# Patient Record
Sex: Male | Born: 1960
Health system: Southern US, Community
[De-identification: ages and names within clinical notes are randomized; demographics above are authoritative.]

## PROBLEM LIST (undated history)

## (undated) DIAGNOSIS — Z95810 Presence of automatic (implantable) cardiac defibrillator: Secondary | ICD-10-CM

## (undated) DIAGNOSIS — I5022 Chronic systolic (congestive) heart failure: Secondary | ICD-10-CM

## (undated) DIAGNOSIS — H269 Unspecified cataract: Secondary | ICD-10-CM

## (undated) DIAGNOSIS — I4891 Unspecified atrial fibrillation: Secondary | ICD-10-CM

## (undated) DIAGNOSIS — E11319 Type 2 diabetes mellitus with unspecified diabetic retinopathy without macular edema: Secondary | ICD-10-CM

## (undated) DIAGNOSIS — E119 Type 2 diabetes mellitus without complications: Secondary | ICD-10-CM

## (undated) DIAGNOSIS — Z91199 Patient's noncompliance with other medical treatment and regimen due to unspecified reason: Secondary | ICD-10-CM

## (undated) DIAGNOSIS — E113299 Type 2 diabetes mellitus with mild nonproliferative diabetic retinopathy without macular edema, unspecified eye: Secondary | ICD-10-CM

## (undated) DIAGNOSIS — N189 Chronic kidney disease, unspecified: Secondary | ICD-10-CM

## (undated) DIAGNOSIS — I10 Essential (primary) hypertension: Secondary | ICD-10-CM

## (undated) DIAGNOSIS — I255 Ischemic cardiomyopathy: Secondary | ICD-10-CM

## (undated) DIAGNOSIS — N183 Chronic kidney disease, stage 3 unspecified: Secondary | ICD-10-CM

## (undated) DIAGNOSIS — M109 Gout, unspecified: Secondary | ICD-10-CM

## (undated) DIAGNOSIS — R0600 Dyspnea, unspecified: Secondary | ICD-10-CM

## (undated) DIAGNOSIS — G629 Polyneuropathy, unspecified: Secondary | ICD-10-CM

## (undated) DIAGNOSIS — I219 Acute myocardial infarction, unspecified: Secondary | ICD-10-CM

## (undated) DIAGNOSIS — R55 Syncope and collapse: Secondary | ICD-10-CM

## (undated) DIAGNOSIS — I509 Heart failure, unspecified: Secondary | ICD-10-CM

## (undated) DIAGNOSIS — H4312 Vitreous hemorrhage, left eye: Secondary | ICD-10-CM

## (undated) DIAGNOSIS — U071 COVID-19: Secondary | ICD-10-CM

## (undated) DIAGNOSIS — Z9119 Patient's noncompliance with other medical treatment and regimen: Secondary | ICD-10-CM

## (undated) DIAGNOSIS — A4902 Methicillin resistant Staphylococcus aureus infection, unspecified site: Secondary | ICD-10-CM

## (undated) DIAGNOSIS — H35039 Hypertensive retinopathy, unspecified eye: Secondary | ICD-10-CM

## (undated) DIAGNOSIS — I2510 Atherosclerotic heart disease of native coronary artery without angina pectoris: Secondary | ICD-10-CM

## (undated) HISTORY — DX: Type 2 diabetes mellitus with unspecified diabetic retinopathy without macular edema: E11.319

## (undated) HISTORY — DX: Ischemic cardiomyopathy: I25.5

## (undated) HISTORY — DX: Acute myocardial infarction, unspecified: I21.9

## (undated) HISTORY — PX: SKIN GRAFT: SHX250

## (undated) HISTORY — PX: INSERT / REPLACE / REMOVE PACEMAKER: SUR710

## (undated) HISTORY — DX: Syncope and collapse: R55

## (undated) HISTORY — DX: Hypertensive retinopathy, unspecified eye: H35.039

## (undated) HISTORY — DX: Heart failure, unspecified: I50.9

## (undated) HISTORY — DX: Chronic systolic (congestive) heart failure: I50.22

## (undated) HISTORY — DX: Unspecified cataract: H26.9

---

## 2000-08-06 HISTORY — PX: CORONARY ARTERY BYPASS GRAFT: SHX141

## 2000-08-06 HISTORY — PX: CARDIAC CATHETERIZATION: SHX172

## 2001-04-19 ENCOUNTER — Emergency Department (HOSPITAL_COMMUNITY): Admission: EM | Admit: 2001-04-19 | Discharge: 2001-04-19 | Payer: Self-pay

## 2001-04-24 ENCOUNTER — Emergency Department (HOSPITAL_COMMUNITY): Admission: EM | Admit: 2001-04-24 | Discharge: 2001-04-24 | Payer: Self-pay

## 2001-05-01 ENCOUNTER — Encounter: Payer: Self-pay | Admitting: Emergency Medicine

## 2001-05-01 ENCOUNTER — Inpatient Hospital Stay (HOSPITAL_COMMUNITY): Admission: EM | Admit: 2001-05-01 | Discharge: 2001-05-13 | Payer: Self-pay | Admitting: Emergency Medicine

## 2001-05-03 ENCOUNTER — Encounter: Payer: Self-pay | Admitting: Cardiology

## 2001-05-07 ENCOUNTER — Encounter: Payer: Self-pay | Admitting: Cardiothoracic Surgery

## 2001-05-07 ENCOUNTER — Encounter: Payer: Self-pay | Admitting: Cardiology

## 2001-05-08 ENCOUNTER — Encounter: Payer: Self-pay | Admitting: Cardiothoracic Surgery

## 2001-05-09 ENCOUNTER — Encounter: Payer: Self-pay | Admitting: Cardiothoracic Surgery

## 2001-05-10 ENCOUNTER — Encounter: Payer: Self-pay | Admitting: Cardiothoracic Surgery

## 2001-06-10 ENCOUNTER — Encounter (HOSPITAL_COMMUNITY): Admission: RE | Admit: 2001-06-10 | Discharge: 2001-09-08 | Payer: Self-pay | Admitting: Cardiology

## 2004-06-13 ENCOUNTER — Ambulatory Visit: Payer: Self-pay | Admitting: Family Medicine

## 2005-08-16 ENCOUNTER — Ambulatory Visit: Payer: Self-pay | Admitting: Family Medicine

## 2005-08-21 ENCOUNTER — Ambulatory Visit: Payer: Self-pay

## 2005-08-28 ENCOUNTER — Ambulatory Visit: Payer: Self-pay | Admitting: *Deleted

## 2008-01-09 ENCOUNTER — Ambulatory Visit: Payer: Self-pay | Admitting: Family Medicine

## 2008-01-09 DIAGNOSIS — I252 Old myocardial infarction: Secondary | ICD-10-CM | POA: Insufficient documentation

## 2008-01-09 DIAGNOSIS — E785 Hyperlipidemia, unspecified: Secondary | ICD-10-CM | POA: Insufficient documentation

## 2008-01-09 DIAGNOSIS — I10 Essential (primary) hypertension: Secondary | ICD-10-CM | POA: Insufficient documentation

## 2008-01-09 DIAGNOSIS — I255 Ischemic cardiomyopathy: Secondary | ICD-10-CM | POA: Insufficient documentation

## 2008-08-09 ENCOUNTER — Ambulatory Visit: Payer: Self-pay | Admitting: Family Medicine

## 2008-08-09 DIAGNOSIS — L02229 Furuncle of trunk, unspecified: Secondary | ICD-10-CM

## 2008-08-09 DIAGNOSIS — L02239 Carbuncle of trunk, unspecified: Secondary | ICD-10-CM | POA: Insufficient documentation

## 2008-08-10 ENCOUNTER — Encounter: Payer: Self-pay | Admitting: Family Medicine

## 2008-10-08 ENCOUNTER — Ambulatory Visit: Payer: Self-pay | Admitting: Family Medicine

## 2008-10-08 DIAGNOSIS — M722 Plantar fascial fibromatosis: Secondary | ICD-10-CM | POA: Insufficient documentation

## 2008-10-19 ENCOUNTER — Encounter: Admission: RE | Admit: 2008-10-19 | Discharge: 2008-10-19 | Payer: Self-pay | Admitting: Podiatry

## 2008-10-21 ENCOUNTER — Emergency Department (HOSPITAL_COMMUNITY): Admission: EM | Admit: 2008-10-21 | Discharge: 2008-10-21 | Payer: Self-pay | Admitting: Emergency Medicine

## 2008-10-21 ENCOUNTER — Ambulatory Visit: Payer: Self-pay | Admitting: Internal Medicine

## 2008-10-21 ENCOUNTER — Inpatient Hospital Stay (HOSPITAL_COMMUNITY): Admission: AD | Admit: 2008-10-21 | Discharge: 2008-10-26 | Payer: Self-pay | Admitting: Internal Medicine

## 2008-11-02 ENCOUNTER — Encounter: Payer: Self-pay | Admitting: Family Medicine

## 2008-11-05 ENCOUNTER — Encounter: Payer: Self-pay | Admitting: Cardiology

## 2008-11-05 ENCOUNTER — Inpatient Hospital Stay (HOSPITAL_COMMUNITY): Admission: EM | Admit: 2008-11-05 | Discharge: 2008-11-09 | Payer: Self-pay | Admitting: Emergency Medicine

## 2008-11-05 ENCOUNTER — Ambulatory Visit: Payer: Self-pay | Admitting: Cardiology

## 2008-11-15 ENCOUNTER — Telehealth (INDEPENDENT_AMBULATORY_CARE_PROVIDER_SITE_OTHER): Payer: Self-pay | Admitting: *Deleted

## 2008-11-15 ENCOUNTER — Ambulatory Visit: Payer: Self-pay | Admitting: Family Medicine

## 2008-11-15 DIAGNOSIS — L02619 Cutaneous abscess of unspecified foot: Secondary | ICD-10-CM | POA: Insufficient documentation

## 2008-11-15 DIAGNOSIS — L03119 Cellulitis of unspecified part of limb: Secondary | ICD-10-CM

## 2008-11-15 DIAGNOSIS — I509 Heart failure, unspecified: Secondary | ICD-10-CM | POA: Insufficient documentation

## 2008-11-17 ENCOUNTER — Encounter: Payer: Self-pay | Admitting: Physician Assistant

## 2008-11-17 ENCOUNTER — Ambulatory Visit: Payer: Self-pay | Admitting: Internal Medicine

## 2008-11-17 ENCOUNTER — Encounter (INDEPENDENT_AMBULATORY_CARE_PROVIDER_SITE_OTHER): Payer: Self-pay | Admitting: *Deleted

## 2008-11-22 ENCOUNTER — Encounter: Payer: Self-pay | Admitting: Family Medicine

## 2010-02-20 ENCOUNTER — Telehealth: Payer: Self-pay | Admitting: Family Medicine

## 2010-05-23 ENCOUNTER — Telehealth: Payer: Self-pay | Admitting: Family Medicine

## 2010-07-19 ENCOUNTER — Telehealth: Payer: Self-pay | Admitting: Family Medicine

## 2010-08-01 ENCOUNTER — Ambulatory Visit
Admission: RE | Admit: 2010-08-01 | Discharge: 2010-08-01 | Payer: Self-pay | Source: Home / Self Care | Attending: Family Medicine | Admitting: Family Medicine

## 2010-08-02 LAB — CONVERTED CEMR LAB
AST: 26 units/L (ref 0–37)
Albumin: 3.3 g/dL — ABNORMAL LOW (ref 3.5–5.2)
Basophils Absolute: 0 10*3/uL (ref 0.0–0.1)
CO2: 29 meq/L (ref 19–32)
Chloride: 97 meq/L (ref 96–112)
Eosinophils Absolute: 0.1 10*3/uL (ref 0.0–0.7)
Glucose, Bld: 318 mg/dL — ABNORMAL HIGH (ref 70–99)
HCT: 39.2 % (ref 39.0–52.0)
Hemoglobin: 12.8 g/dL — ABNORMAL LOW (ref 13.0–17.0)
Lymphs Abs: 2 10*3/uL (ref 0.7–4.0)
MCHC: 32.5 g/dL (ref 30.0–36.0)
MCV: 80.8 fL (ref 78.0–100.0)
Monocytes Absolute: 0.7 10*3/uL (ref 0.1–1.0)
Monocytes Relative: 8.4 % (ref 3.0–12.0)
Neutro Abs: 6 10*3/uL (ref 1.4–7.7)
Potassium: 4.2 meq/L (ref 3.5–5.1)
RDW: 15.8 % — ABNORMAL HIGH (ref 11.5–14.6)
Sodium: 136 meq/L (ref 135–145)
TSH: 1.19 microintl units/mL (ref 0.35–5.50)
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
## 2010-09-07 NOTE — Progress Notes (Signed)
Summary: Refill Request Denied  Phone Note Call from Patient Call back at [phone]   Caller: Patient Summary of Call: [facility] requested refills of my medications, however was told the refills were denied.  Initial call taken by: Candace Cruise,  February 20, 2010 3:58 PM  Follow-up for Phone Call        needs appt, hasn't been here for 1 1/2 years. Will refill meds for 30 days. He will schedule appt. Follow-up by: Chipper Oman, RN,  February 20, 2010 4:45 PM  Additional Follow-up for Phone Call Additional follow up Details #1::        Rx Called In Additional Follow-up by: Chipper Oman, RN,  February 20, 2010 4:45 PM    Prescriptions: POTASSIUM CHLORIDE CRYS CR 20 MEQ CR-TABS (POTASSIUM CHLORIDE CRYS CR) 2 by mouth once daily  #60 x 0   Entered by:   Chipper Oman, RN   Authorized by:   [name] MD   Signed by:   Chipper Oman, RN on 02/20/2010   Method used:   Electronically to        [facility] ([facility]       [address]       [place], [place]  [address]       Ph: [phone]       Fax: [phone]   RxID:   [id number] FUROSEMIDE 40 MG TABS (FUROSEMIDE) take one tablet two times a day  #30 x 0   Entered by:   Chipper Oman, RN   Authorized by:   [name] MD   Signed by:   Chipper Oman, RN on 02/20/2010   Method used:   Electronically to        [facility] ([facility]       [address]       [place], [place]  [address]       Ph: [phone]       Fax: [phone]   RxID:   [id number] GLUCOPHAGE 500 MG TABS (METFORMIN HCL) 1 by mouth two times a day  #30 x 0   Entered by:   Chipper Oman, RN   Authorized by:   [name] MD   Signed by:   Chipper Oman, RN on 02/20/2010   Method used:   Electronically to        [facility]       [address]       [place], [place]  [address]       Ph: [phone]  Fax: [phone]   RxID:   [id number] GLUCOTROL 10 MG TABS (GLIPIZIDE) take two times a day  #30 x 0   Entered by:   Chipper Oman, RN   Authorized by:   [name] MD   Signed by:   Chipper Oman, RN on 02/20/2010   Method used:   Electronically to        [facility]       [address]       [place], [place]  [address]       Ph: [phone]       Fax: [phone]   RxID:   [id number] METOPROLOL SUCCINATE 25 MG XR24H-TAB (  METOPROLOL SUCCINATE) 1 tab once daily  #30 x 0   Entered by:   Chipper Oman, RN   Authorized by:   [name] MD   Signed by:   Chipper Oman, RN on 02/20/2010   Method used:   Electronically to        [facility]       [address]       [place], [place]  [address]       Ph: [phone]       Fax: [phone]   RxID:   [id number] [place] 4 MG TABS (TRANDOLAPRIL) 1 by mouth once daily  #30 Tablet x 0   Entered by:   Chipper Oman, RN   Authorized by:   [name] MD   Signed by:   Chipper Oman, RN on 02/20/2010   Method used:   Electronically to        [facility]       [address]       [place],   [address]       Ph: [phone]       Fax: [phone]   [place]:   [phone]

## 2010-09-07 NOTE — Progress Notes (Signed)
Summary: rx glucophage  Phone Note From Pharmacy   [facility]er[facility] Summary of Call: refill glucophage 500mg  two times a day  Initial call taken by: Levora Angel, RN,  May 23, 2010 4:03 PM  Follow-up for Phone Call        last ov 11-2008 no hgb  a1c in chart   Follow-up by: Levora Angel, RN,  May 23, 2010 4:04 PM

## 2010-09-07 NOTE — Progress Notes (Signed)
Summary: will sch ov needs refill asap  Phone Note Call from Patient Call back at [phone]   Caller: Patient Call For: Marcus Johnson Summary of Call: pt  will make ov asap  can I use sda slot?pt is in need of metformin and glipizide urgently; Please call [facility] [phone] Initial call taken by: Glo Herring,  July 19, 2010 4:29 PM  Follow-up for Phone Call        call in 30 days of both meds. yes work into an Ava soon  Follow-up by: Marcus Johnson,  July 19, 2010 5:22 PM  Additional Follow-up for Phone Call Additional follow up Details #1::        rx sent to [facility] Abbott Pao informed. Additional Follow-up by: Rebeca Alert CMA Deborra Medina),  July 19, 2010 5:31 PM    Prescriptions: GLUCOPHAGE 500 MG TABS (METFORMIN HCL) 1 by mouth two times a day  #30 Tablet x 1   Entered by:   Rebeca Alert CMA ([place])   Authorized by:   Marcus Johnson   Signed by:   Rebeca Alert CMA ([place]) on 07/19/2010   Method used:   Electronically to        [facility]       [address]       [place], [place]  [address]       Ph: [phone]       Fax: [phone]   RxID:   [id number] GLUCOTROL 10 MG TABS (GLIPIZIDE) take two times a day  #30 x 1   Entered by:   Rebeca Alert CMA ([place])   Authorized by:   Marcus Johnson   Signed by:   Rebeca Alert CMA ([place]) on 07/19/2010   Method used:   Electronically to        [facility]       [address]       [place], [place]  [address]       Ph: [phone]       Fax: [phone]   [id number]   Appended Document: will sch ov needs refill asap Pt called back and was scheduled for a cpx (will come in fasting - request labwrk same day)  on  Tuesday - August 15, 2010  at  9:30am.

## 2010-11-15 LAB — CBC
HCT: 28.3 % — ABNORMAL LOW (ref 39.0–52.0)
HCT: 30.3 % — ABNORMAL LOW (ref 39.0–52.0)
HCT: 30.5 % — ABNORMAL LOW (ref 39.0–52.0)
Hemoglobin: 10.1 g/dL — ABNORMAL LOW (ref 13.0–17.0)
MCHC: 31.6 g/dL (ref 30.0–36.0)
MCHC: 34.3 g/dL (ref 30.0–36.0)
MCHC: 34.5 g/dL (ref 30.0–36.0)
MCV: 79.3 fL (ref 78.0–100.0)
MCV: 79.9 fL (ref 78.0–100.0)
MCV: 79.9 fL (ref 78.0–100.0)
MCV: 80.8 fL (ref 78.0–100.0)
Platelets: 252 10*3/uL (ref 150–400)
Platelets: 270 10*3/uL (ref 150–400)
RBC: 3.57 MIL/uL — ABNORMAL LOW (ref 4.22–5.81)
RBC: 3.75 MIL/uL — ABNORMAL LOW (ref 4.22–5.81)
RBC: 3.8 MIL/uL — ABNORMAL LOW (ref 4.22–5.81)
RBC: 4.06 MIL/uL — ABNORMAL LOW (ref 4.22–5.81)
RDW: 13.4 % (ref 11.5–15.5)
WBC: 10 10*3/uL (ref 4.0–10.5)
WBC: 12.1 10*3/uL — ABNORMAL HIGH (ref 4.0–10.5)
WBC: 7.1 10*3/uL (ref 4.0–10.5)
WBC: 7.2 10*3/uL (ref 4.0–10.5)

## 2010-11-15 LAB — GLUCOSE, CAPILLARY
Glucose-Capillary: 128 mg/dL — ABNORMAL HIGH (ref 70–99)
Glucose-Capillary: 205 mg/dL — ABNORMAL HIGH (ref 70–99)
Glucose-Capillary: 205 mg/dL — ABNORMAL HIGH (ref 70–99)
Glucose-Capillary: 212 mg/dL — ABNORMAL HIGH (ref 70–99)
Glucose-Capillary: 214 mg/dL — ABNORMAL HIGH (ref 70–99)
Glucose-Capillary: 219 mg/dL — ABNORMAL HIGH (ref 70–99)
Glucose-Capillary: 232 mg/dL — ABNORMAL HIGH (ref 70–99)
Glucose-Capillary: 233 mg/dL — ABNORMAL HIGH (ref 70–99)
Glucose-Capillary: 267 mg/dL — ABNORMAL HIGH (ref 70–99)

## 2010-11-15 LAB — HEMOGLOBIN A1C
Hgb A1c MFr Bld: 11.7 % — ABNORMAL HIGH (ref 4.6–6.1)
Mean Plasma Glucose: 289 mg/dL

## 2010-11-15 LAB — BASIC METABOLIC PANEL
BUN: 17 mg/dL (ref 6–23)
BUN: 18 mg/dL (ref 6–23)
BUN: 21 mg/dL (ref 6–23)
CO2: 28 mEq/L (ref 19–32)
CO2: 29 mEq/L (ref 19–32)
Chloride: 101 mEq/L (ref 96–112)
Chloride: 103 mEq/L (ref 96–112)
Creatinine, Ser: 0.93 mg/dL (ref 0.4–1.5)
Creatinine, Ser: 0.95 mg/dL (ref 0.4–1.5)
GFR calc Af Amer: >60 mL/min (ref >60)
GFR calc non Af Amer: >60 mL/min (ref >60)
Glucose, Bld: 122 mg/dL — ABNORMAL HIGH (ref 70–99)
Glucose, Bld: 154 mg/dL — ABNORMAL HIGH (ref 70–99)
Glucose, Bld: 166 mg/dL — ABNORMAL HIGH (ref 70–99)
Glucose, Bld: 274 mg/dL — ABNORMAL HIGH (ref 70–99)
Potassium: 3.4 mEq/L — ABNORMAL LOW (ref 3.5–5.1)
Potassium: 3.4 mEq/L — ABNORMAL LOW (ref 3.5–5.1)
Potassium: 3.7 mEq/L (ref 3.5–5.1)
Potassium: 3.8 mEq/L (ref 3.5–5.1)
Sodium: 139 mEq/L (ref 135–145)
Sodium: 141 mEq/L (ref 135–145)

## 2010-11-15 LAB — DIFFERENTIAL
Basophils Relative: 0 % (ref 0–1)
Basophils Relative: 0 % (ref 0–1)
Eosinophils Absolute: 0 10*3/uL (ref 0.0–0.7)
Lymphs Abs: 1.6 10*3/uL (ref 0.7–4.0)
Monocytes Absolute: 0.9 10*3/uL (ref 0.1–1.0)
Monocytes Relative: 10 % (ref 3–12)
Monocytes Relative: 9 % (ref 3–12)
Neutro Abs: 9.3 10*3/uL — ABNORMAL HIGH (ref 1.7–7.7)
Neutrophils Relative %: 77 % (ref 43–77)
Neutrophils Relative %: 79 % — ABNORMAL HIGH (ref 43–77)

## 2010-11-15 LAB — HEPARIN LEVEL (UNFRACTIONATED): Heparin Unfractionated: 0.15 IU/mL — ABNORMAL LOW (ref 0.30–0.70)

## 2010-11-15 LAB — CARDIAC PANEL(CRET KIN+CKTOT+MB+TROPI)
Relative Index: 0.6 (ref 0.0–2.5)
Relative Index: 0.7 (ref 0.0–2.5)
Total CK: 582 U/L — ABNORMAL HIGH (ref 7–232)
Total CK: 660 U/L — ABNORMAL HIGH (ref 7–232)
Troponin I: 0.4 ng/mL — ABNORMAL HIGH (ref 0.00–0.06)
Troponin I: 0.46 ng/mL — ABNORMAL HIGH (ref 0.00–0.06)

## 2010-11-15 LAB — POCT CARDIAC MARKERS
CKMB, poc: 5.3 ng/mL (ref 1.0–8.0)
Troponin i, poc: 0.1 ng/mL — ABNORMAL HIGH (ref 0.00–0.09)

## 2010-11-15 LAB — URINALYSIS, ROUTINE W REFLEX MICROSCOPIC
Ketones, ur: 40 mg/dL — AB
Leukocytes, UA: NEGATIVE
Nitrite: NEGATIVE

## 2010-11-15 LAB — RAPID URINE DRUG SCREEN, HOSP PERFORMED
Amphetamines: NOT DETECTED
Opiates: NOT DETECTED
Tetrahydrocannabinol: NOT DETECTED

## 2010-11-15 LAB — LIPID PANEL
Cholesterol: 173 mg/dL (ref 0–200)
HDL: 17 mg/dL — ABNORMAL LOW (ref >39)

## 2010-11-15 LAB — URINE MICROSCOPIC-ADD ON

## 2010-11-15 LAB — POCT I-STAT, CHEM 8
Chloride: 105 mEq/L (ref 96–112)
HCT: 32 % — ABNORMAL LOW (ref 39.0–52.0)
Potassium: 3.5 mEq/L (ref 3.5–5.1)

## 2010-11-15 LAB — TROPONIN I: Troponin I: 0.17 ng/mL — ABNORMAL HIGH (ref 0.00–0.06)

## 2010-11-15 LAB — PROTIME-INR: INR: 1.5 (ref 0.00–1.49)

## 2010-11-15 LAB — CK TOTAL AND CKMB (NOT AT ARMC): Relative Index: 0.8 (ref 0.0–2.5)

## 2010-11-16 LAB — COMPREHENSIVE METABOLIC PANEL
AST: 16 U/L (ref 0–37)
Albumin: 2.7 g/dL — ABNORMAL LOW (ref 3.5–5.2)
Alkaline Phosphatase: 81 U/L (ref 39–117)
BUN: 16 mg/dL (ref 6–23)
GFR calc Af Amer: >60 mL/min (ref >60)
Potassium: 3.8 mEq/L (ref 3.5–5.1)
Sodium: 132 mEq/L — ABNORMAL LOW (ref 135–145)
Total Protein: 6.9 g/dL (ref 6.0–8.3)

## 2010-11-16 LAB — GLUCOSE, CAPILLARY
Glucose-Capillary: 127 mg/dL — ABNORMAL HIGH (ref 70–99)
Glucose-Capillary: 130 mg/dL — ABNORMAL HIGH (ref 70–99)
Glucose-Capillary: 140 mg/dL — ABNORMAL HIGH (ref 70–99)
Glucose-Capillary: 176 mg/dL — ABNORMAL HIGH (ref 70–99)
Glucose-Capillary: 194 mg/dL — ABNORMAL HIGH (ref 70–99)
Glucose-Capillary: 204 mg/dL — ABNORMAL HIGH (ref 70–99)
Glucose-Capillary: 228 mg/dL — ABNORMAL HIGH (ref 70–99)
Glucose-Capillary: 251 mg/dL — ABNORMAL HIGH (ref 70–99)
Glucose-Capillary: 301 mg/dL — ABNORMAL HIGH (ref 70–99)
Glucose-Capillary: 305 mg/dL — ABNORMAL HIGH (ref 70–99)
Glucose-Capillary: 317 mg/dL — ABNORMAL HIGH (ref 70–99)

## 2010-11-16 LAB — CBC
Platelets: 292 10*3/uL (ref 150–400)
Platelets: 295 10*3/uL (ref 150–400)
Platelets: 327 10*3/uL (ref 150–400)
RDW: 12.9 % (ref 11.5–15.5)
RDW: 13.2 % (ref 11.5–15.5)
WBC: 14.4 10*3/uL — ABNORMAL HIGH (ref 4.0–10.5)
WBC: 14.6 10*3/uL — ABNORMAL HIGH (ref 4.0–10.5)

## 2010-11-16 LAB — DIFFERENTIAL
Basophils Absolute: 0 10*3/uL (ref 0.0–0.1)
Basophils Relative: 0 % (ref 0–1)
Eosinophils Relative: 0 % (ref 0–5)
Eosinophils Relative: 1 % (ref 0–5)
Lymphocytes Relative: 14 % (ref 12–46)
Monocytes Absolute: 1.3 10*3/uL — ABNORMAL HIGH (ref 0.1–1.0)
Monocytes Relative: 9 % (ref 3–12)
Neutro Abs: 10.8 10*3/uL — ABNORMAL HIGH (ref 1.7–7.7)
Neutro Abs: 11.1 10*3/uL — ABNORMAL HIGH (ref 1.7–7.7)

## 2010-11-16 LAB — BASIC METABOLIC PANEL
BUN: 14 mg/dL (ref 6–23)
Creatinine, Ser: 0.67 mg/dL (ref 0.4–1.5)
GFR calc non Af Amer: >60 mL/min (ref >60)
Potassium: 4.1 mEq/L (ref 3.5–5.1)

## 2010-11-16 LAB — PROTIME-INR: Prothrombin Time: 14.4 seconds (ref 11.6–15.2)

## 2010-11-16 LAB — ABO/RH: ABO/RH(D): A NEG

## 2010-11-16 LAB — TYPE AND SCREEN: ABO/RH(D): A NEG

## 2010-11-16 LAB — C-REACTIVE PROTEIN: CRP: 11 mg/dL — ABNORMAL HIGH (ref <0.6)

## 2010-11-16 LAB — VANCOMYCIN, TROUGH: Vancomycin Tr: <5 ug/mL — ABNORMAL LOW (ref 10.0–20.0)

## 2010-11-16 LAB — TSH: TSH: 1.054 u[IU]/mL (ref 0.350–4.500)

## 2010-12-19 NOTE — Discharge Summary (Signed)
NAME:  Marcus Johnson, Marcus Johnson NO.:  [id number]   MEDICAL RECORD NO.:  [id number]          PATIENT TYPE:  INP   LOCATION:  3020                         FACILITY:  [facility]   PHYSICIAN:  Jannifer Rodney. Asa Lente, MDDATE OF BIRTH:  02-28-1961   DATE OF ADMISSION:  10/21/2008  DATE OF DISCHARGE:                               DISCHARGE SUMMARY   DISCHARGE DIAGNOSES:  1. Right foot deep space plantar infection with methicillin-resistant      Staphylococcus aureus abscess status post incision and drainage by      Podiatry, March 19. 2010.  Continue home intravenous antibiotics.      See details below.  2. Uncontrolled type 2 diabetes, complicated by noncompliance, A1c of      13.1, increase in oral medications.  See details below.  3. History of coronary disease status post coronary artery bypass      graft in 2002.  No acute cardiac complaints with high risk for      complications due to noncompliance.  4. Hypertension.  5. Medical noncompliance.   Discharge medications include:  1. Home IV vancomycin 1 g q.8 hours or dosing as per pharmacy      instructions and through April 28 , 2010, which will complete a 6-      week total course as recommended by Podiatry.  2. Metformin 1000 mg p.o. b.i.d.  3. Actos 45 mg daily.  4. Glipizide 10 mg p.o. b.i.d. (this is new dose, previously 5 mg once      daily).  5. Aspirin 325 mg daily.  6. Mavik 4 mg once daily.  7. Toprol-XL 100 mg daily.  8. Percocet 7.5 mg 1 q.4-6 hours p.r.n. pain.   The patient is instructed that he may now discontinue his Cipro and  Augmentin antibiotic pills as he was taking prior to admission.   Hospital followup is scheduled with podiatrist, Dr. Janus Molder, at [facility] for later this week as instructed, also with primary care  physician, Dr. Alysia Penna, in 2 weeks, Monday, November 08, 2008, at 8:30  a.m.  Home Health RN will be arranged for daily right foot dressing  changes as directed by Dr.  Janus Molder and for home IV antibiotics through  the duration of December 01, 2008, for 6 weeks as instructed.  A PICC line  has been placed prior to discharge, and the patient is instructed that  he should remain out of work until cleared by Dr. Janus Molder and Dr.  Alysia Penna for return to work.  He is instructed to remain off his  right foot using crutches or walker when out of bed or until otherwise  instructed by Podiatry.   CONDITION ON DISCHARGE:  Medically stable and improved, afebrile with a  reduction in his white count and reported reduction in his C-reactive  protein and sed rate compared to outpatient records obtained by  Podiatry.   HOSPITAL COURSE:  1. Right foot plantar deep space infection with MRSA abscess.  The      patient is a 50 year old uncontrolled diabetic with multiple risk  factors for diabetic complications who had been followed by      Podiatry as an outpatient for right heel pain, unresponsive to anti-      inflammatory therapy.  He was begun empirically on Cipro and      Augmentin due to concern for infection and sent for an MRI of his      foot prehospitalization on October 19, 2008.  This did show evidence      of concern of phlegmon superior to the plantar fascia, but no      evidence of osteomyelitis and subsequent followup as an outpatient      on October 21, 2008.  Podiatry performed a quick office I and D with      drainage of purulent material which was then sent for culture and      recommended further debridements in the hospital setting with      concurrent IV antibiotics.  Admission was deferred to the Medical      Team, contacted by Podiatry and arranged for later that day.      Culture sent from this outpatient setting did return with MRSA and      so his antibiotics were narrowed to IV vancomycin alone and the      Zosyn was discontinued.  Podiatry did see the patient and perform      an OR I and D as described in his OP note on October 22, 2008,  with      placement of Tobramycin beads.  The patient's foot has experienced      a significant decrease in the erythema and swelling as well as      decreased complaints of pain.  It is felt at this time from an      infectious standpoint, he is stable for discharge home, to continue      home IV antibiotics and daily dressing change at the direction of      Podiatry.  These things are being arranged with Home Health now,      and he is felt stable for discharge home from the standpoint with      outpatient followup at Podiatry later this week as instructed.  2. Uncontrolled type 2 diabetes.  The patient has shown even in the      hospital marked resistance to aggressive treatment of his diabetes,      refusing insulin therapy and disagreement regarding the number and      times of day that he must take diabetic pills.  He has been      provided education by myself and others during this hospitalization      regarding the need for tight control of his diabetes to prevent      further infection to ensure adequate resolution of this as well as      prevent future complications of other coronary disease,      redevelopment, stroke, or loss of limb, vision, or life.  The      patient seems somewhat suspicious of these recommendations even      noting that the hospital has been performing experiments on him in      this regard and that he will do better under his own care once at      home.  Again, I have tried to reiterate to the patient our efforts      at best care according to medical guidelines for improved chances      have  good outcome namely in this acute situation resolution of his      infection and encouraged him to follow up with his primary MD on      these issues.  His A1c at this hospitalization was 13.1.  Glycemic      control in the hospital with the increase in oral medications has      ranged from 168-204 in the 24 hours prior to discharge home.  3. Other medical issues.   The patient's other chronic medical issues      are as listed here.  I suspect these are namely complicated by      noncompliance and apparent suspicion of the medical system, defer      effort, continue medical management to primary care physician, Dr.      Alysia Penna, whom he will see in followup in 2 weeks.  I have      recommended he continue on his medications for hypertension and      aspirin therapy as prior to admission without change.  Please see      chart for further details of this hospitalization.   Over 30 minutes spent on day of discharge coordination for patient eval,  education, chart review, Home Health planning, and coordination of  followup.      Valerie A. Asa Lente, MD  Electronically Signed     VAL/MEDQ  D:  10/25/2008  T:  10/26/2008  Job:  [id number]   cc:   Dellis Filbert A. [place], [place].M.

## 2010-12-19 NOTE — Discharge Summary (Signed)
NAME:  Marcus Johnson, Marcus Johnson NO.:  [id number]   MEDICAL RECORD NO.:  [id number]          PATIENT TYPE:  INP   LOCATION:  [id number]                         FACILITY:  [facility]   PHYSICIAN:  Marijo Conception. Wall, MD, FACCDATE OF BIRTH:  May 26, 1961   DATE OF ADMISSION:  11/05/2008  DATE OF DISCHARGE:  11/09/2008                               DISCHARGE SUMMARY   PRIMARY CARDIOLOGIST:  Marcello Moores C. Verl Blalock, MD, [facility]   PRIMARY CARE PHYSICIAN:  Ishmael Holter. Sarajane Jews, MD   PROCEDURES PERFORMED DURING HOSPITALIZATION:  The patient had  echocardiogram completed on November 05, 2008 revealing per Dr. Ron Parker I  cannot rule out some layered clot in the apex, this is a difficult  cause.  The left ventricle was mildly dilated, overall left ventricular  systolic function was markedly decreased, left ventricular ejection  fraction was estimated range being 20-25% with diffuse left ventricular  hypokinesis, left ventricular wall thickness was mildly increased.  There was mild to moderate mitral valvular regurgitation.  Left atrium  was mildly dilated, right ventricular size was at the upper limits of  normal.  There was mild to moderate tricuspid valvular regurgitation.   FINAL DISCHARGE DIAGNOSES:  1. Acute systolic congestive heart failure ejection fraction per echo      at 20-25%.  2. Coronary artery disease.      a.     Coronary artery bypass grafting in 2002 with left internal       mammary artery (graft) to left anterior descending and ramus,       saphenous vein graft to posterior descending artery and       posterolateral (coronary) artery, saphenous vein graft to obtuse       marginal.  3. Uncontrolled diabetes.  4. Hypertension.  5. Status post right foot plantar infection with methicillin-resistant      Staphylococcus aureus.      a.     Status post incision and drainage on March 2010 with ongoing       wound care.   HOSPITAL COURSE:  This is a 50 year old African American male with known  history  of CAD with coronary artery bypass grafting, uncontrolled  diabetes and hypertension who presented to [facility] ER on November 05, 2008 with increasing shortness of breath, cough, congestion and low  grade nausea.  The patient is also admitted to have had some lower  extremity edema and PND prior to admission.  The patient states that he  has not been taking his medicines as he is unable to afford and has  requested Social Service's assistance in the ER.  The patient was found  to be hypertensive in the emergency room with a blood pressure of 175/76  and tachycardic.  The patient was given IV Lasix, nitroglycerin drip and  IV Lopressor and admitted for further evaluation.  Chest x-ray was  completed which revealed mild central ground-glass opacities  representing a mild pulmonary edema but no evidence of PE.   The patient was continued on diuresis throughout hospitalization and  echocardiogram was completed which revealed an EF of 20-25%.  The  patient's weight did decrease somewhat over the first 2 days dropping  from 95.4 kg to 91.8 kg, however, on discharge the patient's weight was  95.8 kg.  The patient was given an additional IV Lasix 80 mg prior to  discharge with additional potassium.  The patient was also started on  beta blocker, aspirin and ACE inhibitor.  The patient's Actos was  discontinued and the patient's diabetes was treated with sliding scale  insulin.   During hospitalization, hemoglobin A1c was checked and was found to be  elevated at 11.7.  The patient did have some mild elevation of troponins  at 0.46 which we believed was related to his CHF.  The patient did not  have cardiac catheterization during hospitalization as we did not feel  that this was an ST elevated or non-ST elevated MI at this time.  Discussion was had for TEE secondary to questionable clotting in the  apex.  However, this did not occur during hospitalization.  The patient  began to feel better and  had no further complaints of initial symptoms  from admission.   The patient was seen by case worker and found that the patient has [employer] and therefore did not qualify for  medication assistance.  The patient was followed closely throughout  hospitalization and wound care consult was also had secondary to his  recent I&D in March.  The patient is scheduled for an outpatient  followup visit on November 10, 2008 with surgeon and the patient was  continued on p.o. Zithromax during hospitalization as he had limited IV  access.  He had been on vancomycin as an outpatient IV.   The patient was seen and examined by Dr. Jenell Milliner on discharge and  continued to have some mild lower extremity edema that his swelling was  decreased and the symptoms were markedly improved.  The patient's lab  work revealed a creatinine of 0.84.  The patient will follow up in our  office with PA in 1 week for evaluation of the patient's medical  compliance and status.   DISCHARGE LABORATORY DATA:  Sodium 139, potassium 3.8, chloride 107, CO2  28, glucose 122, BUN 17, creatinine 0.84, hemoglobin 9.8, hematocrit  28.3, white blood cells 7.2, platelets 234, magnesium 2.0, hemoglobin  A1c 11.8, cholesterol 173, triglycerides 85, HDL 17, LDL 139.  Cardiac  enzymes 0.40 and 0.46.  Urine drug screen negative.  BNP on admission  1260.  EKG on discharge revealing normal sinus rhythm with left  ventricular hypertrophy.   DISCHARGE MEDICATIONS:  1. Metoprolol ER 100 mg daily.  2. Trandolapril 4 mg daily.  3. Coated aspirin 325 mg daily.  4. Lasix 40 mg 2 tablets twice a day for 3 days, then 1 tablet twice a      day to take on an empty stomach.  5. Potassium 40 mEq daily.  6. Glipizide 10 mg twice a day.  7. Glucophage 500 mg twice a day.  8. The patient has been advised to stop taking Actos.   ALLERGIES:  No known drug allergies.   FOLLOWUP PLANS AND APPOINTMENTS:  1. The patient will  follow up with his surgeon on a previously      scheduled appointment on November 10, 2008 for continued evaluation and      wound care of I and D of his foot.  2. The patient will follow up with Dr. Sarajane Jews on November 15, 2008 at 12:45  p.m. for continued medical management, was advised that the patient      not to be on Actos secondary to his LV systolic dysfunction and      will need to have close monitoring of diabetes.  3. The patient will follow up with the PA at [facility] on      November 17, 2008 at 9:30 a.m. for evaluation of the patient's status      and cardiac issues.  4. The patient has been advised to weigh daily on a digital scale and      write down the weight and bring to the followup appointments.  He      has been advised to weigh the same time each day wearing      essentially the same type of clothes, if his weight goes up 2-3      pounds he is to call our office for instructions.  5. The patient has been counseled on medical compliance as he is      covered by insurance.  He will need to take his medications daily.   Time spent with the patient to include physician time 40 minutes.      Phill Myron. Purcell Nails, NP      [place] Verl Blalock, MD, [facility]  Electronically Signed    KML/MEDQ  D:  11/09/2008  T:  11/10/2008  Job:  [id number]   cc:   Annie Main A. Sarajane Jews, MD

## 2010-12-19 NOTE — H&P (Signed)
NAME:  Marcus Johnson, DUBS NO.:  [id number]   MEDICAL RECORD NO.:  [id number]          PATIENT TYPE:  INP   LOCATION:  [id number]                         FACILITY:  [place]   PHYSICIAN:  Carlena Bjornstad, MD, FACCDATE OF BIRTH:  Mar 09, 1961   DATE OF ADMISSION:  11/05/2008  DATE OF DISCHARGE:                              HISTORY & PHYSICAL   PRIMARY CARDIOLOGIST:  Marcello Moores C. Verl Blalock, MD, [facility]   PRIMARY CARE PHYSICIAN:  Ishmael Holter. Sarajane Jews, MD   HISTORY OF PRESENT ILLNESS:  This is a 50 year old African American male  patient with known history of CAD with four-vessel coronary artery  bypass grafting in 2002, uncontrolled diabetes, and hypertension with  history of medical noncompliance, who was recently discharged on October 25, 2008, after having an I and D of the right foot ulcers and has been  found to have MRSA, has been sent home with a PICC line on IV vancomycin  for 30 days.  Since being discharged on October 25, 2008, the patient has  been feeling worse, having worsening shortness of breath, cough and  congestion, and low-grade nausea.  The patient states that over the last  few days he has been having some lower extremity edema and PND with  increasing cough and congestion.  As a result of this, the patient came  to the emergency room.  The patient admits to medical noncompliance  concerning metformin, glipizide, and Actos as he is unable to afford it  and is requesting for Social Service's assistance.  On my initial  assessment, the patient was found to be hypertensive, tachycardiac with  a blood pressure of 200/100, heart rate of 110, respirations in 30s.  The patient did have some lower extremity edema along with positive JVD.  I prescribed the patient IV Lasix, placed him on a nitroglycerin drip,  and give him IV Lopressor x1.  The patient has begun to diurese and has  begun to feel some better.   REVIEW OF SYSTEMS:  Positive for fevers, sweats, shortness of breath,  dyspnea on exertion, PND, edema, nausea.  All other systems reviewed and  found to be negative.   PAST MEDICAL HISTORY:  1. History of CAD.      a.     Status post coronary artery bypass grafting in 2002.  2. Uncontrolled diabetes.  3. Hypertension.  4. Medical noncompliance secondary to financial issues.  5. Status post right foot plantar deep infection with MRSA status post      I and D on October 22, 2008.   PAST SURGICAL HISTORY:  Coronary artery bypass grafting with LIMA to LAD  and ramus, SVG to PDA and PL, and SVG to OM in 2002.  Also, I and D of  the right foot secondary to deep plantar infection with MRSA on October 22, 2008.   SOCIAL HISTORY:  He lives in [place] with his son.  He is unemployed.  He is unmarried.  He does not smoke.  He does not drink alcohol.  Denies  drug use.   FAMILY HISTORY:  Mother is in a  nursing home with a history of  hypertension.  Father is deceased from complications of diabetes.   CURRENT MEDICATIONS:  1. Aspirin 325 daily.  2. Mavik 4 mg daily.  3. Toprol 100 mg daily.  4. Percocet 7.5 mg p.r.n. (the patient should be on metformin 1000 mg      b.i.d., Actos 45 mg daily, and glipizide 10 mg b.i.d. but is not      taking).  5. The patient is on vancomycin q.12 hours per PICC line.   ALLERGIES:  No known drug allergies.   CURRENT LABORATORY DATA:  Sodium 138, potassium 3.5, chloride 105, CO2  24, BUN 16, creatinine 0.9, glucose 232.  Hemoglobin 10.4, hematocrit  30.4, white blood cells 10.4, platelets 259.  Troponin point of care is  0.10, troponin 0.17 with a CK of 477 and an MB of 3.9.  PT 18.4, INR  1.5.   PHYSICAL EXAMINATION:  VITAL SIGNS:  Blood pressure now 175/76, pulse  104, respirations 32, temperature 99.7, O2 sat 95% on 2 L.  GENERAL:  He is awake, alert, and oriented, mild dyspnea and mildly  anxious.  HEENT:  Head is normocephalic and atraumatic.  Eyes, PERRLA.  Mucous  membranes in the mouth pink and moist.  Tongue is  midline.  NECK:  Supple.  Positive JVD at 10 cm.  No carotid bruit appreciated.  CARDIOVASCULAR:  Tachycardiac with 1/6 systolic murmur with S4 murmur as  well.  LUNGS:  Bilateral rales and rhonchi, most prominent in the left lower  lobe.  ABDOMEN:  Soft, nontender with 2+ bowel sounds.  EXTREMITIES:  Without clubbing or cyanosis.  The patient does have 2+  pitting edema pretibially.  On the right foot, the patient has a walking  cast.  NEUROLOGIC:  Cranial nerves II through XII are grossly intact.   Chest CT reveals bilateral airspace abnormalities, consolidation, most  likely representing pneumonia, mild central ground-glass opacities most  likely representing mild pulmonary edema, negative for PE.  EKG  revealing sinus tachycardia, ventricular rate of 105 beats per minute  with inferior ST changes.   IMPRESSION:  1. Non-ST-elevated myocardial infarction.  2. Acute pulmonary edema.  3. Hypertension.  4. Uncontrolled diabetes.  5. Known history of coronary artery disease status post coronary      artery bypass graft in 2002.  6. Medical noncompliance secondary to financial issues.   PLAN:  This is a 50 year old African American male with known CAD and  coronary artery bypass grafting, status post right foot I and D and he  is secondary to MRSA infection and medical noncompliance who presents to  the emergency room with hypertension, edema, and shortness of breath.   The patient has been seen and examined by myself and Dr. Dola Argyle.  He has had increasing edema at home, not sure why.  He does not usually  need diuretics, but now he is wet.  We will diurese, watch cardiac  enzymes.  The elevated troponin may be related to CHF.  We will use a Z-  Pak for now instead of IV Zithromax as he has limited IV access.  X-ray  findings may be all CHF.  We will start the patient on full-dose Lovenox  for now, treat his diabetes through sliding scale and medicine to  follow.  We will  get help with his medications at home through [employer].  In the interim, we will do a 2-D echo, would not restart  Actos at home, and  will follow making  further recommendations throughout hospital course.  We will wean the  nitroglycerin once the patient is normal antihypertensive medications  are on board and make medication adjustments as necessary.  At this  time, no cardiac catheterization is planned.      Phill Myron. Purcell Nails, NP      Carlena Bjornstad, MD, [facility]  Electronically Signed    KML/MEDQ  D:  11/05/2008  T:  11/05/2008  Job:  [id number]   cc:   Ishmael Holter. Sarajane Jews, MD

## 2010-12-19 NOTE — Op Note (Signed)
NAME:  Marcus Johnson, SOLTAU NO.:  [id number]   MEDICAL RECORD NO.:  [id number]          PATIENT TYPE:  INP   LOCATION:  3020                         FACILITY:  [place]   PHYSICIAN:  Jory Ee. Petrinitz, D.P.M.DATE OF BIRTH:  Oct 24, 1960   DATE OF PROCEDURE:  DATE OF DISCHARGE:                               OPERATIVE REPORT   SURGEON:  Dellis Filbert A. Petrinitz, DPM.   SURGICAL ASSISTANT:  Trudie Buckler, DPM   PREOPERATIVE DIAGNOSIS:  Abscess, diabetic foot infection, right foot  plantar.   POSTOPERATIVE DIAGNOSIS:  Deep space infection, diabetic foot, right.   PROCEDURE PERFORMED:  Incision and drainage of abscess, plantar right  foot.   ANESTHESIA:  Total IV with local.   HEMOSTASIS:  High ankle tourniquet.   BLOOD LOSS.:  Minimal.   PROCEDURE:  The patient was brought to the OR and placed on the table in  supine position.  An ankle tourniquet was placed on superior malleoli  right foot with sufficient padding to prevent contusion.  Upon  achievement of sedation, posterior tibial nerve block is performed with  3 mL of 0.5% Marcaine plus 3 mL of 2% Xylocaine plain.  Right foot is  prepped and draped in the usual aseptic manner and the following  procedure performed:  I and D abscess right foot.  Time-out is  performed.  The plantar aspect of the foot, the iodoform gauze drain is  removed and a longitudinal incision is planned at the site of the  abscess.  This is plantar lateral aspect of the foot and is executed  with a #15 blade, a 5 cm longitudinal incision.  This is superficial and  deep fascial layers at this time.  Purulent drainage is noted.  This was  previously cultured and Gram stain revealed few wbc's present  predominantly PMNL, rare Gram-stain positive cocci in cluster and pairs.  The wound is probed and is found to be a deep space infection.  There is  necrosis of the plantar fascia, this is suctioned and the incision is  deepened in a longitudinal  fashion, both proximal and distal through the  fascia.  The deep space is then irrigated with 3 liters of sterile  saline with the pulse lavage system.  OsteoSet antibiotic beads, calcium  sulphate with tobramycin was previously prepared.  Upon completion of  irrigation, the wound is packed with the tobramycin impregnated beads  followed by a 2-inch iodoform gauze drain.  The wound is covered with  Xeroform followed by dry sterile dressing, ABD pad, fluffy Kerlix,  Kling, and a Coban compression bandage.  This is further reinforced with  an Ace bandage and the ankle  tourniquet is deflated.  Capillary filling time is noted to return to  instantaneous in all digits.  The patient tolerated the procedure and  anesthesia well, transferred to recovery room all vital signs stable.  Will remain nonweightbearing with bathroom privileges with assist.  We  will plan on changing the wound the following morning.           ______________________________  Jory Ee. Lincroft, [place].M.  JAP/MEDQ  D:  10/22/2008  T:  10/23/2008  Job:  [id number]
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
## 2010-12-22 NOTE — Discharge Summary (Signed)
[place]. [facility]  Patient:    Marcus Johnson, Marcus Johnson Visit Number: [id number] MRN: [id number]          Service Type: MED Location: 2000 2009 01 Attending Physician:  Len Childs Dictated by:   Ricki Miller, Gus Height. Admit Date:  05/01/2001 Disc. Date: 05/13/01   CC:         [name], M.D. [facility]   Discharge Summary  HISTORY OF PRESENT ILLNESS:  This is a 50 year old American male with no known significant past medical history except questionable borderline diabetes diagnosed several years ago for which he has been on no specific treatment. Approximately 3 to 4 weeks prior to admission the patient was seen in the emergency department at [facility] where he was complaining of significant cough.  He was treated at that time with antibiotics and cough medicine and proceeded to feel better.  Approximately one week prior to admission he began to feel worse with cough again.  The cough was worse at night when he would lay down, and also he would have substernal chest pain. He was again seen in the emergency department and diagnosed with chest wall pain and treated with nonsteroidal anti-inflammatory drugs.  Over the past three days prior to admission he developed worsening symptoms.  He was unable to sleep with chest pain, nausea, vomiting, and diaphoresis.  He also noted the chest pain to be exertional and related to shortness of breath with going up steps.  He returned to the emergency department on the day of admission, and was found to have EKG changes and elevated troponin-I, and was admitted for further evaluation and treatment for presumed myocardial infarction and unstable angina.  PAST MEDICAL HISTORY: 1. ? type 2 diabetes mellitus. 2. History of left leg surgery as a child.  ADMISSION MEDICATIONS:  None.  ALLERGIES:  No known drug allergies.  SOCIAL HISTORY:  Unremarkable for drugs, alcohol, or tobacco.  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  FAMILY HISTORY:  Unremarkable for coronary artery disease.  REVIEW OF SYSTEMS:  Otherwise unremarkable with the exception of the history of present illness.  PHYSICAL EXAMINATION:  Please see the history and physical done at the time of admission.  HOSPITAL COURSE:  The patient was admitted for myocardial infarction.  He was scheduled for cardiac catheterization, and this was done on 05/02/01, by Dr. Jenkins Rouge.  He was found to have severe three vessel coronary artery disease and an ejection fraction of 38%, no mitral regurgitation.  Due to these findings, surgical consultation was obtained with Dr. Tharon Aquas Trigt who evaluated the patient and studies, and recommended a thallium study to assess cardiac viability.  A 2-D echocardiogram prior to final decision on surgery.  The patients studies were evaluated, and he was felt to be a candidate for surgical revascularization.  It is noted the 2-D echocardiogram showed an ejection fraction of 35% with apical akinesis.  On 05/07/01, the patient was taken to the cardiac operating suite where he underwent the following procedure:  Coronary artery bypass grafting x 4 with the following grafts replaced:  Left internal mammary artery to the optional diagonal, saphenous vein graft to the circumflex, sequential saphenous vein graft to the posterior descending and posterolateral coronary arteries.  Cross clamp time was 70 minutes, pump time 145 minutes.  Of note, intraoperative findings was that the left anterior descending artery was not graftable.  Also noted intraoperatively was the apical and  septal regions showing significant scarring.  The patient was taken to the surgical intensive care unit in stable condition.  POSTOPERATIVE HOSPITAL COURSE:  The patient has done well.  He initially required low dose pressors, specifically milrinone and dopamine.  When he was able to be weaned from these without significant  difficulties.  He was also weaned from the ventilator without difficulty.  His advancement has been fairly routine in regard to cardiac rehabilitation phase and modalities.  It is noted the patient has been treated for diabetes during this hospitalization.  A hemoglobin A1C was 9.3 preoperatively.  He has been started on oral ______ therapy, and his capillary blood glucoses have been adequately controlled.  He initially was treated with the glucomander and Lantus insulin, but these have been stopped.  Currently, the patient is afebrile.  He is still up approximately 8 pounds above his preoperative weight, and showing some evidence of mild edema, but is responding well to diuretics.  He has maintained normal sinus rhythm with no significant cardiac dysrhythmias or ectopy.  His incisions are healing well without signs of infection.  He is tolerating diet, but it is noted that nausea has been somewhat of an issue, limiting his ability to eat.  Pancreatic enzymes were unremarkable.  Currently, it is overall felt that he will be stable for discharge in the morning of 05/13/01, pending morning round re-evaluation.  Note, the patients lipid profile shows elevated cholesterol of 243 total, LDL of 169, triglycerides of 169.  The patients nausea has disallowed starting a statin, but he will more than likely require this as an outpatient.  CONDITION ON DISCHARGE:  Stable and improving.  DISCHARGE MEDICATIONS: 1. Toprol XL 25 mg q.d. 2. Altace 5 mg q.d. 3. Lasix 40 mg q.d. x 14 days. 4. Potassium chloride 20 mEq q.d. x 14 days. 5. Aspirin 325 mg q.d. 6. Glucotrol 5 mg q.d. 7. Protonix 40 mg q.d. 8. Ultram 50 mg one or two q.6h. p.r.n.  DISCHARGE INSTRUCTIONS:  The patient received written instructions in regard to medications, activity, diet, wound care, and followup.  FOLLOWUP: 1. Staple removal at the CVTS office on Friday, May 16, 2001, at 9 a.m. 2. Dr. Verl Blalock in two weeks. 3. Dr. Prescott Gum in three weeks.  FINAL DIAGNOSES:  1. Coronary artery disease. 2. Acute out of hospital myocardial infarction with ischemic cardiomyopathy. 3. Borderline diabetes mellitus type 2. 4. History of childhood surgery left leg. 5. History of recent pneumonia. Dictated by:   Ricki Miller, Gus Height. Attending Physician:  Len Childs DD:  05/12/01 TD:  05/12/01 Job: [id number]

## 2010-12-22 NOTE — Cardiovascular Report (Signed)
[place]. [facility]  Patient:    Marcus Johnson, Marcus Johnson Visit Number: [id number] MRN: [id number]          Service Type: MED Location: [phone] Attending Physician:  Garth Bigness Dictated by:   Wallis Bamberg Johnsie Cancel, M.D. [facility] Proc. Date: 05/02/01 Admit Date:  05/01/2001   CC:         Marcus Johnson, M.D. [facility]   Cardiac Catheterization  PROCEDURE:  Coronary Angiography.  CARDIOLOGIST:  Wallis Bamberg. Johnsie Cancel, M.D. [facility], [facility]  INDICATION:  Heart failure, probable out-of-hospital MI.  RESULTS:  The left main coronary artery had a 30% distal stenosis.  Left anterior descending artery was 100% occluded proximally.  There were faint bridging collaterals to the mid vessel, but he true lumen to the vessel and the apex were never visualized.  There was a large intermediate branch with 70% mid and distal disease.  The circumflex coronary artery was 100% occluded proximally.  Right coronary artery had 30% multiple discrete lesions; in the proximal portion, there were 40% multiple discrete lesions; in the mid vessel, there was a tubular lesion on the bend of 70% in the distal vessel.  The posterolateral branch had 70% diffuse disease.  The PDA had 40% multiple discrete lesions.  RAO VENTRICULOGRAPHY:  RAO ventriculography revealed diffuse hypokinesis which was slightly worse in the anterior Johnson and apex.  The ejection fraction was calculated at 38%.  There was no MR.  Right heart catheterization was done due to the severity of the patients LV dysfunction and need for further hemodynamic assessment in regard to therapy. Mean right atrial pressure was 3 mmHg.  PA pressure was 22/12 with a mean of 16 mmHg.  Mean pulmonary capillary wedge pressure was 11 mmHg.  Aortic pressure was 108/75, LV pressure 128/31.  Cardiac output was 48 liters per minute with a cardiac index 2.2 liters per minute per meter squared.  IMPRESSION:  The films were reviewed by Dr.  Vicenta Aly.  There is really no percutaneous intervention that we can offer this patient.  He is basically living off his intermediate branch and right coronary artery, both of which have disease.  These are the only vessels that could possibly be bypassed.  I think the best option at this point is continuous medical therapy.  We will do a rest, rest thallium study on him to further assess viability in the anterior and lateral walls.  We will get a CVTS consult to see if they think bypass the right coronary artery and intermediate is worthwhile. Unfortunately, given the patients young age, I suspect he will end being a transplant candidate. Dictated by:   Wallis Bamberg Johnsie Cancel, M.D. [place] Attending Physician:  Garth Bigness DD:  05/02/01 TD:  05/02/01 Job: [id number]

## 2010-12-22 NOTE — Op Note (Signed)
[place]. [facility]  Patient:    Marcus Johnson, Marcus Johnson Visit Number: [id number] MRN: [id number]          Service Type: MED Location: [id number] Attending Physician:  Len Childs Dictated by:   Len Childs, M.D. Proc. Date: 05/07/01 Admit Date:  05/01/2001   CC:         CVTS office  [facility] attn Dr. Mar Daring   Operative Report  OPERATION:  Coronary artery bypass grafting x 4 (left internal mammary artery to ramus intermediate, sequential saphenous vein graft to posterior descending and posterior lateral branch of the right coronary artery, saphenous vein graft to the obtuse marginal).  PREOPERATIVE DIAGNOSES: 1. Ischemic cardiomyopathy. 2. Class IV congestive heart failure. 3. Class IV angina.  POSTOPERATIVE DIAGNOSES: 1. Ischemic cardiomyopathy. 2. Class IV congestive heart failure. 3. Class IV angina.  SURGEON:  Len Childs, M.D.  ASSISTANTS: 1. Valentina Gu. Roxy Manns, M.D. 2. Earnstine Regal, P.A.  ANESTHESIA:  General by Dr. Roberts Gaudy.  INDICATIONS FOR PROCEDURE:  The patient is a 50 year old type II diabetic who presented with symptoms of unstable angina and congestive heart failure. Cardiac enzymes were positive, and a cardiac catheterization demonstrated severe three vessel coronary artery disease with reduced left ventricular function.  He had total occlusion of the left anterior descending artery with an apical scar and apical akinesia.  He had total occlusion of the circumflex, and an 80% stenosis of the right coronary proximally and distally.  Due to his ischemic cardiomyopathy with severe three vessel disease and reduced left ventricular function (ejection fraction of 35%) he was referred for a surgical coronary revascularization.  Prior to the operation, I examined the patient in his hospital room, and reviewed the results of the cardiac catheterization with the patient and his family.  I  discussed the indications and expected benefits of coronary bypass surgery for treatment of his ischemic cardiomyopathy.  I reviewed the alternatives to surgical therapy for treatment of his coronary artery disease, and the expected outcome of those alternative therapies.  I reviewed the major aspects of the coronary bypass operation with the patient, including the location of the surgical incisions, the choice of conduit, the use of general anesthesia and cardiopulmonary bypass, and the expected postoperative recovery period.  I reviewed with the patient and his family the risks associated with this operation, including the risks of myocardial infarction, cerebrovascular accident, bleeding, infection, and death.  He understood these aspects of the operation and agreed to proceed with the operation as planned, under what I felt was an informed consent.  OPERATIVE FINDINGS:  The patient has cardiomegaly.  The anterior apical and septal area were scarred.  The left anterior descending artery was non-graftable.  The circumflex was a poor target, but graftable.  The right coronary artery system had diffuse disease.  The patient would not be a candidate for redo coronary artery bypass surgery due to the poor vessels for grafting.  The mammary artery was small, but with excellent flow.  The saphenous vein was harvested from both lower extremities between the ankle and knee.  The saphenous vein was of average quality to below average.  DESCRIPTION OF PROCEDURE:  The patient was brought to the operating room and placed supine on the operating room table where general anesthesia was induced under invasive hemodynamic monitoring.  The chest, abdomen, and legs were prepped with Betadine and draped as a sterile field.  A median sternotomy was performed.  The saphenous vein was harvested from the lower legs.  The internal mammary artery was harvested as a pedicle graft from its origin at the  subclavian vessels.  Heparin was administered, and ACT was documented as being therapeutic.  The sternal retractor was placed and the pericardium opened.  Two pursestrings were placed in the ascending aorta and right atrium, and the patient was cannulated and placed on bypass, cooled to 32 degrees. The coronaries were identified for grafting, and the mammary artery and vein grafts were prepared for the distal anastomoses.  Cardioplegia cannulas were placed for both antegrade and retrograde delivery of cold blood Cardioplegia. The patient was cooled to 32 degrees.  The aortic cross clamp was applied.  A total of 600 cc of cold blood Cardioplegia was delivered in split doses between the antegrade and retrograde catheters.  In flow temperature was reduced to 28 degrees.  There was good cardioplegic arrest with septal temperature dropping to less than 12 degrees, and topical iced saline and slush was used to augment myocardial preservation.  A pericardial insulator pad was used to protect the left phrenic nerve.  The distal coronary anastomoses were then performed.  The first distal anastomoses consisted of a sequential vein graft to the posterior descending continuing to the posterior lateral.  The posterior descending was a 1.5 mm vessel with proximal 80% stenosis, and a side branch of the vein was sewn side-to-side with running 7-0 Prolene with a good flow through the graft.  A second distal anastomosis was the continuation of the vein to the PL.  This was a smaller 1.0 mm vessel, and the vein was sewn end-to-side with running 8-0 Prolene with good flow through the graft.  Cardioplegia was redosed.  The third distal anastomosis was the obtuse marginal.  This was totally occluded proximally.  It was a 1.0 mm vessel, and a reverse saphenous vein was sewn end-to-side with running 7-0 Prolene with adequate flow through the graft. The fourth distal anastomosis was to the optional diagonal or  ramus  intermediate branch under the left atrial appendage.  This was a 1.8 mm vessel, proximal 80% stenosis.  The left internal mammary artery pedicle was brought through an opening created in the left collateral pericardium, was brought down onto the ramus intermediate and sewn with a running 8-0 Prolene. There was excellent flow through the anastomosis with immediate rise in myocardial temperature after release of the pedicle clamp on the mammary artery.  The mammary pedicle was secured on the epicardium, and the aortic cross clamp was removed.  The heart resumed a spontaneous rhythm.  Using a partial occluding clamp, two proximal vein anastomoses were placed on the ascending aorta using a 4.0 mm punch and running 6-0 Prolene.  The partial clamp was removed, and the vein grafts were perfused.  All grafts had good flow and hemostasis was documented to the proximal and distal sites.  The patient was rewarmed to 37 degrees, and pericardial pacing wires were applied.  The wound was re-expanded and the ventilator was resumed.  The patient was weaned from bypass without difficulty on low dose dopamine and milrinone.  Cardiac output was excellent, and hemodynamics were stable.  Protamine was administered, and the cannula was removed.  The mediastinum was irrigated with warm antibiotic irrigation.  The leg incisions were irrigated and closed in a standard fashion.  The superior pericardium was closed over the aorta and right ventricle.  Two mediastinal and a left pleural chest tube were placed and brought  out through separate incisions.  The sternum was reapproximated with interrupted steel wire.  The pectoralis fascia was closed with interrupted #1 Vicryl.  The subcutaneous and skin were closed with a running Vicryl, and sterile dressings were applied. Total bypass time was 140 minutes with aortic cross clamp of 65 minutes. Dictated by:   Len Childs, M.D. Attending Physician:  Len Childs DD:  05/07/01 TD:  05/08/01 Job: [id number]
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
## 2011-06-18 ENCOUNTER — Encounter (HOSPITAL_COMMUNITY): Payer: Self-pay | Admitting: Emergency Medicine

## 2011-06-18 ENCOUNTER — Emergency Department (INDEPENDENT_AMBULATORY_CARE_PROVIDER_SITE_OTHER)
Admission: EM | Admit: 2011-06-18 | Discharge: 2011-06-18 | Disposition: A | Discharge status: Home / Self Care | Payer: BLUE CROSS/BLUE SHIELD | Source: Home / Self Care | Attending: Emergency Medicine | Admitting: Emergency Medicine

## 2011-06-18 DIAGNOSIS — R05 Cough: Secondary | ICD-10-CM

## 2011-06-18 DIAGNOSIS — R059 Cough, unspecified: Secondary | ICD-10-CM

## 2011-06-18 HISTORY — DX: Essential (primary) hypertension: I10

## 2011-06-18 HISTORY — DX: Atherosclerotic heart disease of native coronary artery without angina pectoris: I25.10

## 2011-06-18 LAB — POCT RAPID STREP A: Streptococcus, Group A Screen (Direct): NEGATIVE

## 2011-06-18 MED ORDER — PREDNISONE 20 MG PO TABS: 20.0000 mg | ORAL_TABLET | Freq: Every day | ORAL | Status: DC

## 2011-06-18 MED ORDER — PREDNISONE 20 MG PO TABS: 20.0000 mg | ORAL_TABLET | Freq: Every day | ORAL | Status: AC

## 2011-06-18 NOTE — ED Provider Notes (Addendum)
History     CSN: [id number] Arrival date & time: 06/18/2011  8:25 AM   First MD Initiated Contact with Patient 06/18/11 [phone]      No chief complaint on file.   HPI Comments: "tHROAT FEELS CONGESTED AND WITH A COUGH" i HAVE BEEN WORKING WITH THIS COUGH, NO SOB, NO FEVERS  Patient is a 50 y.o. male presenting with cough. The history is provided by the patient.  Cough This is a new problem. The current episode started more than 1 week ago. The problem occurs constantly. The problem has been gradually worsening. The cough is productive of sputum. There has been no fever. Associated symptoms include rhinorrhea, sore throat and eye redness. Pertinent negatives include no chest pain, no chills, no sweats, no ear pain, no shortness of breath and no wheezing. He has tried decongestants ([place]) for the symptoms. The treatment provided mild relief. His past medical history does not include pneumonia.    No past medical history on file.  No past surgical history on file.  No family history on file.  History  Substance Use Topics  . Smoking status: Not on file  . Smokeless tobacco: Not on file  . Alcohol Use: Not on file      Review of Systems  Constitutional: Negative for chills.  HENT: Positive for sore throat and rhinorrhea. Negative for ear pain.   Eyes: Positive for redness.  Respiratory: Positive for cough. Negative for shortness of breath and wheezing.   Cardiovascular: Negative for chest pain.    Allergies  Review of patient's allergies indicates not on file.  Home Medications  No current outpatient prescriptions on file.  Pulse 95  Temp(Src) 99.5 F (37.5 C) (Oral)  Resp 12  SpO2 100%  Physical Exam  Nursing note and vitals reviewed. Constitutional: He appears well-developed and well-nourished.  HENT:  Mouth/Throat: No oropharyngeal exudate.  Eyes: Pupils are equal, round, and reactive to light.  Neck: Trachea normal.  Pulmonary/Chest: Effort  normal and breath sounds normal. No respiratory distress. He has no wheezes. He has no rales. He exhibits no tenderness.  Skin: Skin is warm.    ED Course  Procedures (including critical care time)   Labs Reviewed  POCT RAPID STREP A ([place])   No results found.   No diagnosis found.    MDM  COUGH- X 1 WEEK. NORMAL EXAM- MILD PHARYNGEAL ERYTHEMA-        Rosana Hoes, MD 06/18/11 [id number]  Rosana Hoes, MD 06/18/11 [id number]  Rosana Hoes, MD 06/18/11 [phone]

## 2011-08-20 ENCOUNTER — Ambulatory Visit (INDEPENDENT_AMBULATORY_CARE_PROVIDER_SITE_OTHER): Payer: BLUE CROSS/BLUE SHIELD | Admitting: Family Medicine

## 2011-08-20 ENCOUNTER — Encounter: Payer: Self-pay | Admitting: Family Medicine

## 2011-08-20 VITALS — BP 136/80 | HR 100 | Temp 99.6°F | Ht 69.5 in | Wt 215.0 lb

## 2011-08-20 DIAGNOSIS — Z0000 Encounter for general adult medical examination without abnormal findings: Secondary | ICD-10-CM

## 2011-08-20 DIAGNOSIS — Z23 Encounter for immunization: Secondary | ICD-10-CM

## 2011-08-20 DIAGNOSIS — E119 Type 2 diabetes mellitus without complications: Secondary | ICD-10-CM

## 2011-08-20 LAB — CBC WITH DIFFERENTIAL/PLATELET
Eosinophils Absolute: 0.1 10*3/uL (ref 0.0–0.7)
Eosinophils Relative: 1.2 % (ref 0.0–5.0)
HCT: 41.6 % (ref 39.0–52.0)
Lymphs Abs: 2.7 10*3/uL (ref 0.7–4.0)
MCHC: 32.9 g/dL (ref 30.0–36.0)
MCV: 81.1 fl (ref 78.0–100.0)
Monocytes Absolute: 0.6 10*3/uL (ref 0.1–1.0)
Neutrophils Relative %: 50.7 % (ref 43.0–77.0)
Platelets: 202 10*3/uL (ref 150.0–400.0)
WBC: 7 10*3/uL (ref 4.5–10.5)

## 2011-08-20 LAB — POCT URINALYSIS DIPSTICK
Nitrite, UA: NEGATIVE
Spec Grav, UA: >=1.03
Urobilinogen, UA: 0.2
pH, UA: 5

## 2011-08-20 LAB — BASIC METABOLIC PANEL
BUN: 14 mg/dL (ref 6–23)
CO2: 25 mEq/L (ref 19–32)
Chloride: 100 mEq/L (ref 96–112)
GFR: 120.69 mL/min (ref >60.00)
Glucose, Bld: 309 mg/dL — ABNORMAL HIGH (ref 70–99)
Potassium: 4.3 mEq/L (ref 3.5–5.1)
Sodium: 139 mEq/L (ref 135–145)

## 2011-08-20 LAB — LIPID PANEL: HDL: 36.7 mg/dL — ABNORMAL LOW (ref >39.00)

## 2011-08-20 LAB — HEPATIC FUNCTION PANEL
ALT: 23 U/L (ref 0–53)
AST: 23 U/L (ref 0–37)
Albumin: 3.5 g/dL (ref 3.5–5.2)
Alkaline Phosphatase: 87 U/L (ref 39–117)
Bilirubin, Direct: 0.1 mg/dL (ref 0.0–0.3)
Total Protein: 7 g/dL (ref 6.0–8.3)

## 2011-08-20 LAB — HEMOGLOBIN A1C: Hgb A1c MFr Bld: 15 % — ABNORMAL HIGH (ref 4.6–6.5)

## 2011-08-20 MED ORDER — KETOCONAZOLE 2 % EX CREA: TOPICAL_CREAM | Freq: Every day | CUTANEOUS | Status: DC

## 2011-08-20 MED ORDER — FUROSEMIDE 40 MG PO TABS: 40.0000 mg | ORAL_TABLET | Freq: Two times a day (BID) | ORAL | Status: DC

## 2011-08-20 MED ORDER — GLIPIZIDE 10 MG PO TABS: 10.0000 mg | ORAL_TABLET | Freq: Two times a day (BID) | ORAL | Status: DC

## 2011-08-20 MED ORDER — METFORMIN HCL 500 MG PO TABS: 500.0000 mg | ORAL_TABLET | Freq: Two times a day (BID) | ORAL | Status: DC

## 2011-08-20 MED ORDER — POTASSIUM CHLORIDE 20 MEQ PO PACK: 40.0000 meq | PACK | Freq: Every day | ORAL | Status: DC

## 2011-08-20 MED ORDER — TRANDOLAPRIL 4 MG PO TABS: 4.0000 mg | ORAL_TABLET | Freq: Every day | ORAL | Status: DC

## 2011-08-20 MED ORDER — METOPROLOL SUCCINATE ER 100 MG PO TB24: 100.0000 mg | ORAL_TABLET | Freq: Every day | ORAL | Status: DC

## 2011-08-20 NOTE — Progress Notes (Signed)
Addended by: Aggie Hacker A on: 08/20/2011 10:07 AM   Modules accepted: Orders

## 2011-08-20 NOTE — Progress Notes (Signed)
  Subjective:    Patient ID: Marcus Johnson, male    DOB: May 19, 1961, 51 y.o.   MRN: [id number]  HPI 51 yr old male for a cpx. He feels fine in general. His am glucoses are running a bit high at 140 or so. He has not seen Dr. Verl Blalock for over 2 years.    Review of Systems  Constitutional: Negative.   HENT: Negative.   Eyes: Negative.   Respiratory: Negative.   Cardiovascular: Negative.   Gastrointestinal: Negative.   Genitourinary: Negative.   Musculoskeletal: Negative.   Skin: Negative.   Neurological: Negative.   Hematological: Negative.   Psychiatric/Behavioral: Negative.        Objective:   Physical Exam  Constitutional: He is oriented to person, place, and time. He appears well-developed and well-nourished. No distress.  HENT:  Head: Normocephalic and atraumatic.  Right Ear: External ear normal.  Left Ear: External ear normal.  Nose: Nose normal.  Mouth/Throat: Oropharynx is clear and moist. No oropharyngeal exudate.  Eyes: Conjunctivae and EOM are normal. Pupils are equal, round, and reactive to light. Right eye exhibits no discharge. Left eye exhibits no discharge. No scleral icterus.  Neck: Neck supple. No JVD present. No tracheal deviation present. No thyromegaly present.  Cardiovascular: Normal rate, regular rhythm, normal heart sounds and intact distal pulses.  Exam reveals no gallop and no friction rub.   No murmur heard.      EKG at his baseline with LAFB   Pulmonary/Chest: Effort normal and breath sounds normal. No respiratory distress. He has no wheezes. He has no rales. He exhibits no tenderness.  Abdominal: Soft. Bowel sounds are normal. He exhibits no distension and no mass. There is no tenderness. There is no rebound and no guarding.  Genitourinary: Rectum normal and prostate normal. Guaiac negative stool. No penile tenderness.       His foreskin is red with a few shallow fissures   Musculoskeletal: Normal range of motion. He exhibits no edema and no  tenderness.  Lymphadenopathy:    He has no cervical adenopathy.  Neurological: He is alert and oriented to person, place, and time. He has normal reflexes. No cranial nerve deficit. He exhibits normal muscle tone. Coordination normal.  Skin: Skin is warm and dry. No rash noted. He is not diaphoretic. No erythema. No pallor.  Psychiatric: He has a normal mood and affect. His behavior is normal. Judgment and thought content normal.          Assessment & Plan:  Well exam. Given a flu shot. Get fasting labs. Set up a colonoscopy. Use Ketoconazole cream for the Candidal infection on the foreskin. He needs to see Dr. Verl Blalock soon.

## 2011-08-23 ENCOUNTER — Encounter: Payer: Self-pay | Admitting: Family Medicine

## 2011-08-23 MED ORDER — ATORVASTATIN CALCIUM 40 MG PO TABS: 40.0000 mg | ORAL_TABLET | Freq: Every day | ORAL | Status: DC

## 2011-08-23 NOTE — Progress Notes (Signed)
Addended by: Aggie Hacker A on: 08/23/2011 09:27 AM   Modules accepted: Orders

## 2011-08-23 NOTE — Progress Notes (Signed)
Quick Note:  Spoke with pt, put a copy of lab results in mail, sent script e-scribe. Pt stated that he would call back ASAP to schedule a office visit with Dr. Sarajane Jews. ______

## 2011-09-17 ENCOUNTER — Ambulatory Visit (INDEPENDENT_AMBULATORY_CARE_PROVIDER_SITE_OTHER): Payer: BLUE CROSS/BLUE SHIELD | Admitting: Family Medicine

## 2011-09-17 ENCOUNTER — Encounter: Payer: Self-pay | Admitting: Family Medicine

## 2011-09-17 ENCOUNTER — Other Ambulatory Visit: Payer: Self-pay | Admitting: Family Medicine

## 2011-09-17 VITALS — BP 118/70 | HR 103 | Temp 98.5°F | Wt 217.0 lb

## 2011-09-17 DIAGNOSIS — E785 Hyperlipidemia, unspecified: Secondary | ICD-10-CM

## 2011-09-17 DIAGNOSIS — IMO0001 Reserved for inherently not codable concepts without codable children: Secondary | ICD-10-CM

## 2011-09-17 DIAGNOSIS — IMO0002 Reserved for concepts with insufficient information to code with codable children: Secondary | ICD-10-CM

## 2011-09-17 DIAGNOSIS — E1165 Type 2 diabetes mellitus with hyperglycemia: Secondary | ICD-10-CM

## 2011-09-17 MED ORDER — INSULIN ASPART PROT & ASPART (70-30 MIX) 100 UNIT/ML ~~LOC~~ SUSP: 20.0000 [IU] | Freq: Two times a day (BID) | SUBCUTANEOUS | Status: DC

## 2011-09-17 MED ORDER — INSULIN GLARGINE 100 UNIT/ML ~~LOC~~ SOLN: 40.0000 [IU] | Freq: Every day | SUBCUTANEOUS | Status: DC

## 2011-09-17 NOTE — Progress Notes (Signed)
  Subjective:    Patient ID: Marcus Johnson, male    DOB: Aug 02, 1961, 51 y.o.   MRN: [id number]  HPI Here at my request to discuss starting on insulin. His recent A1c was 15.0 despite following a diet and taking several oral diabetes meds. His LDL was 206. He feels fine in general. He is here with his son, who is familiar with insulin since several other family members use it.    Review of Systems  Constitutional: Negative.   Respiratory: Negative.   Cardiovascular: Negative.        Objective:   Physical Exam  Constitutional: He appears well-developed and well-nourished.  Cardiovascular: Normal rate, regular rhythm, normal heart sounds and intact distal pulses.   Pulmonary/Chest: Effort normal and breath sounds normal.          Assessment & Plan:  Poorly controlled type 2 diabetes which now requires insulin. We will stop the Glipizide and Metformin, and we will start on Novolog 70/30 bid and Lantus at bedtime. He will check his glucoses tid, and will see me back in 2 weeks.

## 2011-10-10 ENCOUNTER — Ambulatory Visit (INDEPENDENT_AMBULATORY_CARE_PROVIDER_SITE_OTHER): Payer: BLUE CROSS/BLUE SHIELD | Admitting: Family Medicine

## 2011-10-10 ENCOUNTER — Encounter: Payer: Self-pay | Admitting: Family Medicine

## 2011-10-10 VITALS — BP 124/88 | HR 86 | Temp 99.1°F | Wt 230.0 lb

## 2011-10-10 DIAGNOSIS — R600 Localized edema: Secondary | ICD-10-CM

## 2011-10-10 DIAGNOSIS — R609 Edema, unspecified: Secondary | ICD-10-CM

## 2011-10-10 DIAGNOSIS — E119 Type 2 diabetes mellitus without complications: Secondary | ICD-10-CM

## 2011-10-10 DIAGNOSIS — I10 Essential (primary) hypertension: Secondary | ICD-10-CM

## 2011-10-10 MED ORDER — POTASSIUM CHLORIDE 20 MEQ PO PACK: 20.0000 meq | PACK | Freq: Two times a day (BID) | ORAL | Status: DC

## 2011-10-10 MED ORDER — FUROSEMIDE 40 MG PO TABS: 40.0000 mg | ORAL_TABLET | Freq: Three times a day (TID) | ORAL | Status: DC

## 2011-10-10 NOTE — Progress Notes (Signed)
  Subjective:    Patient ID: Marcus Johnson, male    DOB: 02-03-1961, 51 y.o.   MRN: [id number]  HPI Here to follow up after we started him on insulin. He is taking Lantus 40 units at night and Novolog 70/30 20 units bid. He has done well since then. His am fasting glucoses range from 70 to 200, with most of them from 100-120. He feels better in general but he is having trouble with edema in the legs. They go down at night but they swell during the day when he is up on his feet. No SOB.    Review of Systems  Constitutional: Negative.   Respiratory: Negative.   Cardiovascular: Positive for leg swelling. Negative for chest pain and palpitations.       Objective:   Physical Exam  Constitutional: He appears well-developed and well-nourished.  Neck: No thyromegaly present.  Cardiovascular: Normal rate, regular rhythm, normal heart sounds and intact distal pulses.  Exam reveals no gallop and no friction rub.   No murmur heard. Pulmonary/Chest: Effort normal and breath sounds normal. No respiratory distress. He has no wheezes. He has no rales.  Musculoskeletal:       2+ edema in both lower legs   Lymphadenopathy:    He has no cervical adenopathy.          Assessment & Plan:  Keep his insulin doses the same. We will see him back in 2 months and we will plan on getting another A1c at that time. We will increase the Lasix to 80 mg in the am and 40 mg in the pm. Increase the potassium to bid.

## 2011-11-08 ENCOUNTER — Encounter: Payer: Self-pay | Admitting: Gastroenterology

## 2011-11-16 ENCOUNTER — Other Ambulatory Visit: Payer: Self-pay | Admitting: *Deleted

## 2011-11-16 NOTE — Telephone Encounter (Signed)
Call in a one year supply for this

## 2011-11-16 NOTE — Telephone Encounter (Signed)
Pt. States his Lasix has been increased to 40 mg bid, and needs to have his refills changed to reflect this change.

## 2011-11-19 NOTE — Telephone Encounter (Signed)
This was sent in on 10/10/11.

## 2011-11-19 NOTE — Telephone Encounter (Signed)
Addended by: Aggie Hacker A on: 11/19/2011 05:46 PM   Modules accepted: Orders

## 2011-11-22 MED ORDER — FUROSEMIDE 40 MG PO TABS: 40.0000 mg | ORAL_TABLET | Freq: Three times a day (TID) | ORAL | Status: DC

## 2011-12-31 ENCOUNTER — Encounter (HOSPITAL_COMMUNITY): Payer: Self-pay

## 2011-12-31 ENCOUNTER — Emergency Department (HOSPITAL_COMMUNITY)
Admission: EM | Admit: 2011-12-31 | Discharge: 2011-12-31 | Disposition: A | Discharge status: Home Health | Payer: BLUE CROSS/BLUE SHIELD | Source: Home / Self Care | Attending: Family Medicine | Admitting: Family Medicine

## 2011-12-31 DIAGNOSIS — E1149 Type 2 diabetes mellitus with other diabetic neurological complication: Secondary | ICD-10-CM

## 2011-12-31 DIAGNOSIS — G909 Disorder of the autonomic nervous system, unspecified: Secondary | ICD-10-CM

## 2011-12-31 DIAGNOSIS — E1142 Type 2 diabetes mellitus with diabetic polyneuropathy: Secondary | ICD-10-CM

## 2011-12-31 MED ORDER — PREGABALIN 75 MG PO CAPS: 75.0000 mg | ORAL_CAPSULE | Freq: Two times a day (BID) | ORAL | Status: DC

## 2011-12-31 NOTE — ED Provider Notes (Signed)
History     CSN: [id number]  Arrival date & time 12/31/11  [id number]   First MD Initiated Contact with Patient 12/31/11 1030      Chief Complaint  Patient presents with  . Numbness  . Pain    (Consider location/radiation/quality/duration/timing/severity/associated sxs/prior treatment) Patient is a 51 y.o. male presenting with neurologic complaint. The history is provided by the patient.  Neurologic Problem The primary symptoms include paresthesias. Primary symptoms do not include headaches or dizziness. The symptoms began more than 1 week ago (1 month, has not been seen by lmd since march.Marland Kitchen). The symptoms are worsening.  Additional symptoms do not include weakness. Medical issues also include diabetes.    Past Medical History  Diagnosis Date  . Hypertension   . Coronary artery disease     sees Dr. Mar Daring   . CHF (congestive heart failure)   . Myocardial infarction   . Diabetes mellitus     type 2 requiring insulin     Past Surgical History  Procedure Date  . Cardiac surgery 2002    quadruple bypass at [facility]  . Coronary artery bypass graft     No family history on file.  History  Substance Use Topics  . Smoking status: Never Smoker   . Smokeless tobacco: Never Used  . Alcohol Use: No      Review of Systems  Constitutional: Negative.   Musculoskeletal: Negative.   Neurological: Positive for numbness and paresthesias. Negative for dizziness, weakness and headaches.    Allergies  Review of patient's allergies indicates no known allergies.  Home Medications   Current Outpatient Rx  Name Route Sig Dispense Refill  . ASPIRIN 325 MG PO TABS Oral Take 325 mg by mouth daily.      . ATORVASTATIN CALCIUM 40 MG PO TABS Oral Take 1 tablet (40 mg total) by mouth daily. 30 tablet 11  . FUROSEMIDE 40 MG PO TABS Oral Take 1 tablet (40 mg total) by mouth 3 (three) times daily. 90 tablet 11  . INSULIN ASPART PROT & ASPART (70-30) 100 UNIT/ML [place] SUSP Subcutaneous  Inject 20 Units into the skin 2 (two) times daily with a meal. 3 mL 12  . INSULIN GLARGINE 100 UNIT/ML [place] SOLN Subcutaneous Inject 40 Units into the skin at bedtime. 3 pen 11  . METOPROLOL SUCCINATE ER 100 MG PO TB24 Oral Take 1 tablet (100 mg total) by mouth daily. 30 tablet 11  . POTASSIUM CHLORIDE 20 MEQ PO PACK Oral Take 20 mEq by mouth 2 (two) times daily. 60 packet 11  . PREGABALIN 75 MG PO CAPS Oral Take 1 capsule (75 mg total) by mouth 2 (two) times daily. 60 capsule 3  . TRANDOLAPRIL 4 MG PO TABS Oral Take 1 tablet (4 mg total) by mouth daily. 30 tablet 11    BP 122/85  Pulse 85  Temp(Src) 99.5 F (37.5 C) (Oral)  Resp 18  SpO2 97%  Physical Exam  Nursing note and vitals reviewed. Constitutional: He is oriented to person, place, and time. He appears well-developed and well-nourished.  Neurological: He is alert and oriented to person, place, and time.       paresthesias to hands and feet to mid tibia bilat, no edema, pulses 1+.  Skin: Skin is warm and dry.    ED Course  Procedures (including critical care time)  Labs Reviewed - No data to display No results found.   1. Diabetic peripheral neuropathy associated with type 2 diabetes mellitus  MDM          Billy Fischer, MD 12/31/11 1149

## 2011-12-31 NOTE — ED Notes (Signed)
Pt c/o numbness and pain to bilateral hands and feet.  Pt states onset 1 month ago.  Pt has DM.  States CBGs at home have been in the 170s.  Pt switched from oral meds to insulin about 3 months ago.

## 2012-01-03 ENCOUNTER — Encounter (HOSPITAL_COMMUNITY): Payer: Self-pay

## 2012-01-03 ENCOUNTER — Encounter: Payer: Self-pay | Admitting: Family

## 2012-01-03 ENCOUNTER — Emergency Department (HOSPITAL_COMMUNITY): Payer: BC Managed Care – PPO

## 2012-01-03 ENCOUNTER — Ambulatory Visit (INDEPENDENT_AMBULATORY_CARE_PROVIDER_SITE_OTHER): Payer: BLUE CROSS/BLUE SHIELD | Admitting: Family

## 2012-01-03 ENCOUNTER — Inpatient Hospital Stay (HOSPITAL_COMMUNITY)
Admission: EM | Admit: 2012-01-03 | Discharge: 2012-01-08 | DRG: 549 | Disposition: A | Discharge status: Home / Self Care | Payer: BC Managed Care – PPO | Source: Ambulatory Visit | Attending: Cardiology | Admitting: Cardiology

## 2012-01-03 VITALS — BP 124/80 | HR 83 | Wt 226.0 lb

## 2012-01-03 DIAGNOSIS — R55 Syncope and collapse: Secondary | ICD-10-CM

## 2012-01-03 DIAGNOSIS — I5023 Acute on chronic systolic (congestive) heart failure: Secondary | ICD-10-CM | POA: Diagnosis present

## 2012-01-03 DIAGNOSIS — I509 Heart failure, unspecified: Secondary | ICD-10-CM | POA: Diagnosis present

## 2012-01-03 DIAGNOSIS — Z9119 Patient's noncompliance with other medical treatment and regimen: Secondary | ICD-10-CM

## 2012-01-03 DIAGNOSIS — Z794 Long term (current) use of insulin: Secondary | ICD-10-CM

## 2012-01-03 DIAGNOSIS — Z91199 Patient's noncompliance with other medical treatment and regimen due to unspecified reason: Secondary | ICD-10-CM

## 2012-01-03 DIAGNOSIS — E785 Hyperlipidemia, unspecified: Secondary | ICD-10-CM | POA: Diagnosis present

## 2012-01-03 DIAGNOSIS — Z95810 Presence of automatic (implantable) cardiac defibrillator: Secondary | ICD-10-CM

## 2012-01-03 DIAGNOSIS — R319 Hematuria, unspecified: Secondary | ICD-10-CM | POA: Diagnosis present

## 2012-01-03 DIAGNOSIS — E119 Type 2 diabetes mellitus without complications: Secondary | ICD-10-CM

## 2012-01-03 DIAGNOSIS — I252 Old myocardial infarction: Secondary | ICD-10-CM

## 2012-01-03 DIAGNOSIS — Z7982 Long term (current) use of aspirin: Secondary | ICD-10-CM

## 2012-01-03 DIAGNOSIS — Z951 Presence of aortocoronary bypass graft: Secondary | ICD-10-CM

## 2012-01-03 DIAGNOSIS — I2589 Other forms of chronic ischemic heart disease: Secondary | ICD-10-CM | POA: Diagnosis present

## 2012-01-03 DIAGNOSIS — I10 Essential (primary) hypertension: Secondary | ICD-10-CM

## 2012-01-03 DIAGNOSIS — Z8614 Personal history of Methicillin resistant Staphylococcus aureus infection: Secondary | ICD-10-CM

## 2012-01-03 DIAGNOSIS — Z79899 Other long term (current) drug therapy: Secondary | ICD-10-CM

## 2012-01-03 DIAGNOSIS — R809 Proteinuria, unspecified: Secondary | ICD-10-CM | POA: Diagnosis present

## 2012-01-03 DIAGNOSIS — I739 Peripheral vascular disease, unspecified: Secondary | ICD-10-CM | POA: Diagnosis present

## 2012-01-03 DIAGNOSIS — E1142 Type 2 diabetes mellitus with diabetic polyneuropathy: Secondary | ICD-10-CM | POA: Diagnosis present

## 2012-01-03 DIAGNOSIS — I2582 Chronic total occlusion of coronary artery: Secondary | ICD-10-CM | POA: Diagnosis present

## 2012-01-03 DIAGNOSIS — E782 Mixed hyperlipidemia: Secondary | ICD-10-CM

## 2012-01-03 DIAGNOSIS — I2510 Atherosclerotic heart disease of native coronary artery without angina pectoris: Secondary | ICD-10-CM

## 2012-01-03 DIAGNOSIS — I2789 Other specified pulmonary heart diseases: Secondary | ICD-10-CM | POA: Diagnosis present

## 2012-01-03 DIAGNOSIS — E1149 Type 2 diabetes mellitus with other diabetic neurological complication: Secondary | ICD-10-CM | POA: Diagnosis present

## 2012-01-03 HISTORY — DX: Polyneuropathy, unspecified: G62.9

## 2012-01-03 HISTORY — DX: Patient's noncompliance with other medical treatment and regimen: Z91.19

## 2012-01-03 HISTORY — DX: Methicillin resistant Staphylococcus aureus infection, unspecified site: A49.02

## 2012-01-03 HISTORY — DX: Patient's noncompliance with other medical treatment and regimen due to unspecified reason: Z91.199

## 2012-01-03 LAB — CBC
HCT: 40.6 % (ref 39.0–52.0)
Hemoglobin: 12.9 g/dL — ABNORMAL LOW (ref 13.0–17.0)
Hemoglobin: 12.7 g/dL — ABNORMAL LOW (ref 13.0–17.0)
MCH: 25.4 pg — ABNORMAL LOW (ref 26.0–34.0)
MCH: 25.7 pg — ABNORMAL LOW (ref 26.0–34.0)
MCHC: 31.8 g/dL (ref 30.0–36.0)
RBC: 4.95 MIL/uL (ref 4.22–5.81)
RDW: 15.9 % — ABNORMAL HIGH (ref 11.5–15.5)
WBC: 8.5 10*3/uL (ref 4.0–10.5)

## 2012-01-03 LAB — CARDIAC PANEL(CRET KIN+CKTOT+MB+TROPI)
Relative Index: 1.7 (ref 0.0–2.5)
Total CK: 474 U/L — ABNORMAL HIGH (ref 7–232)

## 2012-01-03 LAB — URINALYSIS, ROUTINE W REFLEX MICROSCOPIC
Ketones, ur: NEGATIVE mg/dL
Nitrite: NEGATIVE
Protein, ur: >300 mg/dL — AB
Urobilinogen, UA: 1 mg/dL (ref 0.0–1.0)

## 2012-01-03 LAB — HEPATIC FUNCTION PANEL
Albumin: 3.4 g/dL — ABNORMAL LOW (ref 3.5–5.2)
Indirect Bilirubin: 1.2 mg/dL — ABNORMAL HIGH (ref 0.3–0.9)
Total Protein: 7.3 g/dL (ref 6.0–8.3)

## 2012-01-03 LAB — DIFFERENTIAL
Basophils Relative: 0 % (ref 0–1)
Eosinophils Absolute: 0.1 10*3/uL (ref 0.0–0.7)
Monocytes Absolute: 0.9 10*3/uL (ref 0.1–1.0)
Monocytes Relative: 9 % (ref 3–12)
Neutrophils Relative %: 61 % (ref 43–77)

## 2012-01-03 LAB — GLUCOSE, CAPILLARY
Glucose-Capillary: 166 mg/dL — ABNORMAL HIGH (ref 70–99)
Glucose-Capillary: 152 mg/dL — ABNORMAL HIGH (ref 70–99)
Glucose-Capillary: 176 mg/dL — ABNORMAL HIGH (ref 70–99)

## 2012-01-03 LAB — PROTIME-INR
INR: 1.21 (ref 0.00–1.49)
Prothrombin Time: 15.6 seconds — ABNORMAL HIGH (ref 11.6–15.2)

## 2012-01-03 LAB — RAPID URINE DRUG SCREEN, HOSP PERFORMED
Barbiturates: NOT DETECTED
Tetrahydrocannabinol: NOT DETECTED

## 2012-01-03 LAB — BASIC METABOLIC PANEL
BUN: 24 mg/dL — ABNORMAL HIGH (ref 6–23)
Creatinine, Ser: 1.05 mg/dL (ref 0.50–1.35)
GFR calc Af Amer: >90 mL/min (ref >90)
GFR calc non Af Amer: 80 mL/min — ABNORMAL LOW (ref >90)

## 2012-01-03 LAB — PRO B NATRIURETIC PEPTIDE: Pro B Natriuretic peptide (BNP): 1160 pg/mL — ABNORMAL HIGH (ref 0–125)

## 2012-01-03 LAB — CREATININE, SERUM
Creatinine, Ser: 0.96 mg/dL (ref 0.50–1.35)
GFR calc Af Amer: >90 mL/min (ref >90)
GFR calc non Af Amer: >90 mL/min (ref >90)

## 2012-01-03 LAB — TROPONIN I: Troponin I: <0.3 ng/mL (ref <0.30)

## 2012-01-03 MED ORDER — PREGABALIN 75 MG PO CAPS: 75.0000 mg | ORAL_CAPSULE | Freq: Two times a day (BID) | ORAL | Status: DC

## 2012-01-03 MED ORDER — ATORVASTATIN CALCIUM 40 MG PO TABS
40.0000 mg | ORAL_TABLET | Freq: Every day | ORAL | Status: DC
Start: 2012-01-03 — End: 2012-01-08
  Administered 2012-01-03 – 2012-01-07 (×5): 40 mg via ORAL
  Filled 2012-01-03 (×7): qty 1

## 2012-01-03 MED ORDER — FUROSEMIDE 10 MG/ML IJ SOLN
80.0000 mg | Freq: Two times a day (BID) | INTRAMUSCULAR | Status: DC
  Administered 2012-01-03 – 2012-01-05 (×4): 80 mg via INTRAVENOUS
  Filled 2012-01-03 (×6): qty 8

## 2012-01-03 MED ORDER — SODIUM CHLORIDE 0.9 % IJ SOLN
3.0000 mL | Freq: Two times a day (BID) | INTRAMUSCULAR | Status: DC
  Administered 2012-01-03: 3 mL via INTRAVENOUS

## 2012-01-03 MED ORDER — ASPIRIN 81 MG PO CHEW
324.0000 mg | CHEWABLE_TABLET | ORAL | Status: AC
  Administered 2012-01-04: 324 mg via ORAL
  Filled 2012-01-03: qty 4

## 2012-01-03 MED ORDER — CARVEDILOL 6.25 MG PO TABS
6.2500 mg | ORAL_TABLET | Freq: Two times a day (BID) | ORAL | Status: DC
  Administered 2012-01-04 – 2012-01-08 (×8): 6.25 mg via ORAL
  Filled 2012-01-03 (×11): qty 1

## 2012-01-03 MED ORDER — SODIUM CHLORIDE 0.9 % IV SOLN: 250.0000 mL | INTRAVENOUS | Status: DC | PRN

## 2012-01-03 MED ORDER — ASPIRIN 325 MG PO TABS
325.0000 mg | ORAL_TABLET | Freq: Every day | ORAL | Status: DC
  Administered 2012-01-05 – 2012-01-07 (×3): 325 mg via ORAL
  Filled 2012-01-03 (×4): qty 1

## 2012-01-03 MED ORDER — SODIUM CHLORIDE 0.9 % IJ SOLN
3.0000 mL | Freq: Two times a day (BID) | INTRAMUSCULAR | Status: DC
  Administered 2012-01-04 – 2012-01-05 (×3): 3 mL via INTRAVENOUS
  Administered 2012-01-05: 10:00:00 via INTRAVENOUS
  Administered 2012-01-06 – 2012-01-08 (×5): 3 mL via INTRAVENOUS

## 2012-01-03 MED ORDER — SODIUM CHLORIDE 0.9 % IV SOLN
INTRAVENOUS | Status: DC
  Administered 2012-01-03: 19:00:00 via INTRAVENOUS

## 2012-01-03 MED ORDER — INSULIN ASPART 100 UNIT/ML ~~LOC~~ SOLN
0.0000 [IU] | Freq: Every day | SUBCUTANEOUS | Status: DC
  Administered 2012-01-07: 2 [IU] via SUBCUTANEOUS

## 2012-01-03 MED ORDER — ACETAMINOPHEN 325 MG PO TABS: 650.0000 mg | ORAL_TABLET | ORAL | Status: DC | PRN

## 2012-01-03 MED ORDER — SODIUM CHLORIDE 0.9 % IJ SOLN: 3.0000 mL | INTRAMUSCULAR | Status: DC | PRN

## 2012-01-03 MED ORDER — PREGABALIN 25 MG PO CAPS
75.0000 mg | ORAL_CAPSULE | Freq: Two times a day (BID) | ORAL | Status: DC
  Administered 2012-01-03 – 2012-01-08 (×10): 75 mg via ORAL
  Filled 2012-01-03 (×10): qty 3

## 2012-01-03 MED ORDER — INSULIN ASPART 100 UNIT/ML ~~LOC~~ SOLN
0.0000 [IU] | Freq: Three times a day (TID) | SUBCUTANEOUS | Status: DC
  Administered 2012-01-04 – 2012-01-05 (×4): 3 [IU] via SUBCUTANEOUS
  Administered 2012-01-05: 2 [IU] via SUBCUTANEOUS
  Administered 2012-01-05: 3 [IU] via SUBCUTANEOUS
  Administered 2012-01-06: 2 [IU] via SUBCUTANEOUS
  Administered 2012-01-06: 3 [IU] via SUBCUTANEOUS
  Administered 2012-01-07: 2 [IU] via SUBCUTANEOUS
  Administered 2012-01-07: 3 [IU] via SUBCUTANEOUS
  Administered 2012-01-07: 5 [IU] via SUBCUTANEOUS
  Administered 2012-01-08: 2 [IU] via SUBCUTANEOUS

## 2012-01-03 MED ORDER — HEPARIN SODIUM (PORCINE) 5000 UNIT/ML IJ SOLN
5000.0000 [IU] | Freq: Three times a day (TID) | INTRAMUSCULAR | Status: DC
  Administered 2012-01-03 – 2012-01-08 (×11): 5000 [IU] via SUBCUTANEOUS
  Filled 2012-01-03 (×17): qty 1

## 2012-01-03 MED ORDER — POTASSIUM CHLORIDE CRYS ER 20 MEQ PO TBCR
20.0000 meq | EXTENDED_RELEASE_TABLET | Freq: Two times a day (BID) | ORAL | Status: DC
  Administered 2012-01-03 – 2012-01-08 (×10): 20 meq via ORAL
  Filled 2012-01-03 (×12): qty 1

## 2012-01-03 MED ORDER — ONDANSETRON HCL 4 MG/2ML IJ SOLN: 4.0000 mg | Freq: Four times a day (QID) | INTRAMUSCULAR | Status: DC | PRN

## 2012-01-03 MED ORDER — LISINOPRIL 5 MG PO TABS
5.0000 mg | ORAL_TABLET | Freq: Two times a day (BID) | ORAL | Status: DC
  Administered 2012-01-03 – 2012-01-08 (×10): 5 mg via ORAL
  Filled 2012-01-03 (×12): qty 1

## 2012-01-03 NOTE — ED Notes (Signed)
Pt presents with 2 syncopal episodes while at work today before lunch.  Pt reports he was walking, went down on his knee, but was able to get back up.  Pt reports diaphoresis; pt reports he was walking again, had another syncopal episode, was out x 2-3 minutes.  Pt denies any pain or shortness of breath.  Pt reports he started taking Lyrica on Monday, reports numbness in both feet and hands.

## 2012-01-03 NOTE — ED Notes (Signed)
Pt aware of need of urine specimen; urinal placed at bedside for pt to use

## 2012-01-03 NOTE — ED Notes (Signed)
Report called to [phone]

## 2012-01-03 NOTE — ED Notes (Signed)
The pt has been passing out since he was started on insulin 3 months ago.  He gets dizzy then nauseated before he passes out

## 2012-01-03 NOTE — ED Notes (Signed)
Doctor in talking to the pt

## 2012-01-03 NOTE — ED Provider Notes (Signed)
Medical screening examination/treatment/procedure(s) were performed by non-physician practitioner and as supervising physician I was immediately available for consultation/collaboration.  Richarda Blade, MD 01/03/12 2312

## 2012-01-03 NOTE — ED Notes (Signed)
The pt wants to go to the br. Not able to go to the br not monitored in br

## 2012-01-03 NOTE — ED Notes (Signed)
CBG checked by EMT R Andoni Busch-152

## 2012-01-03 NOTE — ED Notes (Signed)
Pt alert still no pain or discomfort.  Family at the bedside

## 2012-01-03 NOTE — Progress Notes (Signed)
Subjective:    Patient ID: Marcus Johnson, male    DOB: [birth year], 51 y.o.   MRN: [id number]  HPI 51 year old American male is in today after being seen by EMS earlier today after a syncopal episode at work. Patient refused ago with EMS because he was out of town. He presents today after this episode. He has had 4 syncopal episodes in the last 2 months. They are usually triggered with walking or going up steps. He denies any current chest pain or heart palpitations. At times, his symptoms are accompanied by nausea and vomiting. He denies any current nausea or vomiting. He has a past medical history of quadruple bypass 10 years ago. In accordance the patient has had a history of silent myocardial infarction multiple times.   Patient has a history of type 2 diabetes, nonfasting blood sugar with EMS was 227. He was recently started on insulin for control of his type 2 diabetes.   Review of Systems  Constitutional: Negative.   HENT: Negative.   Respiratory: Negative for shortness of breath.   Cardiovascular: Positive for leg swelling. Negative for chest pain and palpitations.  Gastrointestinal: Positive for nausea and vomiting. Negative for abdominal pain, diarrhea, constipation and abdominal distention.  Genitourinary: Negative.   Musculoskeletal: Negative.   Skin: Negative.   Neurological: Positive for dizziness and syncope.  Hematological: Negative.   Psychiatric/Behavioral: Negative.    Past Medical History  Diagnosis Date  . Hypertension   . Coronary artery disease     sees Dr. Mar Daring   . CHF (congestive heart failure)   . Myocardial infarction   . Diabetes mellitus     type 2 requiring insulin     History   Social History  . Marital Status: Single    Spouse Name: N/A    Number of Children: N/A  . Years of Education: N/A   Occupational History  . Not on file.   Social History Main Topics  . Smoking status: Never Smoker   . Smokeless tobacco: Never Used  . Alcohol  Use: No  . Drug Use: No  . Sexually Active: Not on file   Other Topics Concern  . Not on file   Social History Narrative  . No narrative on file    Past Surgical History  Procedure Date  . Cardiac surgery 2002    quadruple bypass at [facility]  . Coronary artery bypass graft     No family history on file.  No Known Allergies  Current Outpatient Prescriptions on File Prior to Visit  Medication Sig Dispense Refill  . aspirin 325 MG tablet Take 325 mg by mouth daily.        Marland Kitchen atorvastatin (LIPITOR) 40 MG tablet Take 1 tablet (40 mg total) by mouth daily.  30 tablet  11  . furosemide (LASIX) 40 MG tablet Take 1 tablet (40 mg total) by mouth 3 (three) times daily.  90 tablet  11  . insulin aspart protamine-insulin aspart (NOVOLOG MIX 70/30 FLEXPEN) (70-30) 100 UNIT/ML injection Inject 20 Units into the skin 2 (two) times daily with a meal.  3 mL  12  . insulin glargine (LANTUS SOLOSTAR) 100 UNIT/ML injection Inject 40 Units into the skin at bedtime.  3 pen  11  . metoprolol succinate (TOPROL-XL) 100 MG 24 hr tablet Take 1 tablet (100 mg total) by mouth daily.  30 tablet  11  . potassium chloride (KLOR-CON) 20 MEQ packet Take 20 mEq by mouth 2 (two) times  daily.  60 packet  11  . pregabalin (LYRICA) 75 MG capsule Take 1 capsule (75 mg total) by mouth 2 (two) times daily.  60 capsule  3  . trandolapril (MAVIK) 4 MG tablet Take 1 tablet (4 mg total) by mouth daily.  30 tablet  11    BP 124/80  Pulse 83  Wt 226 lb (102.513 kg)  SpO2 98%chart     Objective:   Physical Exam  Constitutional: He is oriented to person, place, and time. He appears well-developed and well-nourished.  HENT:  Right Ear: External ear normal.  Left Ear: External ear normal.  Nose: Nose normal.  Mouth/Throat: Oropharynx is clear and moist.  Neck: Normal range of motion. Neck supple.  Cardiovascular: Normal rate, regular rhythm and intact distal pulses.   Murmur heard.      2/3, consistent with likely  aortic stenosis  Pulmonary/Chest: Effort normal and breath sounds normal.  Abdominal: Soft. Bowel sounds are normal.  Musculoskeletal: Normal range of motion.  Neurological: He is alert and oriented to person, place, and time.  Skin: Skin is warm and dry.  Psychiatric: He has a normal mood and affect.          Assessment & Plan:  Assessment:: Syncope, CAD, HTN, Probable Aortic Stenosis,  Plan: To ED for further workup. Patient wishes to transported by car, his son is here to drive him. Report called to the triage nurse at [facility] the emergency department. We'll follow with patient post discharge.

## 2012-01-03 NOTE — ED Notes (Signed)
POCT CBG resulted 166; RN notified
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
## 2012-01-03 NOTE — ED Notes (Signed)
Pt taken to ct 

## 2012-01-03 NOTE — ED Notes (Signed)
Pt nsr on the monitor

## 2012-01-03 NOTE — ED Notes (Signed)
Cards in to see ?

## 2012-01-03 NOTE — ED Provider Notes (Signed)
History     CSN: [id number]  Arrival date & time 01/03/12  1414   First MD Initiated Contact with Patient 01/03/12 1547      Chief Complaint  Patient presents with  . Loss of Consciousness    (Consider location/radiation/quality/duration/timing/severity/associated sxs/prior treatment) HPI History from patient. 51 year old male who is status post CABG with history of diabetes and CHF who presents after syncopal episode today. 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 This was witnessed by a coworker who told him that he fell down onto his knees, briefly got back up and then fell to the ground again. He has no recollection of this. It is unclear exactly for how long he was unconscious, but may have been as long as 2 minutes. He does not recall any preceding symptoms and has no recollection of falling and getting back up. Denies any chest pain, SOB, nausea, vomiting, visual change, dizziness with this, but was diaphoretic. CBG was checked by EMS which was ~200.  States that he has had several episodes similar to this over the past several months but has not been seen by a physician for this; states they have been mainly when walking, especially with walking up stairs or walking distances. Typically no prodrome although states he does occasionally feel dizzy beforehand and feels as if he is "walking sideways." He does state that he was recently started on insulin for his diabetes but states episodes started prior to his medication change. Cardiologist is Dr. Verl Blalock with [facility].  Has taken 325mg  ASA today.   Past Medical History  Diagnosis Date  . Hypertension   . Coronary artery disease     sees Dr. Mar Daring   . CHF (congestive heart failure)   . Myocardial infarction   . Diabetes mellitus     type 2 requiring insulin     Past Surgical History  Procedure Date  . Cardiac surgery 2002    quadruple bypass at [facility]  . Coronary artery  bypass graft     No family history on file.  History  Substance Use Topics  . Smoking status: Never Smoker   . Smokeless tobacco: Never Used  . Alcohol Use: No      Review of Systems  Constitutional: Negative for fever, chills, activity change and appetite change.  HENT: Negative for congestion, drooling and trouble swallowing.   Eyes: Negative for photophobia and visual disturbance.  Respiratory: Negative for cough, chest tightness and shortness of breath.   Cardiovascular: Negative for chest pain and palpitations.  Gastrointestinal: Negative for nausea, vomiting and abdominal pain.  Musculoskeletal: Negative for myalgias.  Skin: Negative for color change and rash.  Neurological: Positive for syncope. Negative for dizziness, seizures, weakness and headaches.    Allergies  Review of patient's allergies indicates no known allergies.  Home Medications   Current Outpatient Rx  Name Route Sig Dispense Refill  . ASPIRIN 325 MG PO TABS Oral Take 325 mg by mouth daily.      . ATORVASTATIN CALCIUM 40 MG PO TABS Oral Take 1 tablet (40 mg total) by mouth daily. 30 tablet 11  . FUROSEMIDE 40 MG PO TABS Oral Take 80 mg by mouth 2 (two) times daily.    . INSULIN ASPART PROT & ASPART (70-30) 100 UNIT/ML [place] SUSP Subcutaneous Inject 20 Units into the skin 2 (two) times daily with a meal. 3 mL 12  . INSULIN GLARGINE 100 UNIT/ML [place] SOLN Subcutaneous Inject  40 Units into the skin at bedtime. 3 pen 11  . METOPROLOL SUCCINATE ER 100 MG PO TB24 Oral Take 1 tablet (100 mg total) by mouth daily. 30 tablet 11  . POTASSIUM CHLORIDE 20 MEQ PO PACK Oral Take 20 mEq by mouth 2 (two) times daily. 60 packet 11  . PREGABALIN 75 MG PO CAPS Oral Take 1 capsule (75 mg total) by mouth 2 (two) times daily. 60 capsule 3  . TRANDOLAPRIL 4 MG PO TABS Oral Take 1 tablet (4 mg total) by mouth daily. 30 tablet 11    BP 135/82  Pulse 80  Temp(Src) 98.6 F (37 C) (Oral)  Resp 14  Ht 5\' 11"  (1.803 m)  Wt 226 lb  (102.513 kg)  BMI 31.52 kg/m2  SpO2 100%  Unremarkable orthostatics  Physical Exam  Nursing note and vitals reviewed. Constitutional: He is oriented to person, place, and time. He appears well-developed and well-nourished. No distress.  HENT:  Head: Normocephalic and atraumatic.  Mouth/Throat: Oropharynx is clear and moist. No oropharyngeal exudate.  Eyes: Conjunctivae and EOM are normal. Pupils are equal, round, and reactive to light.  Neck: Normal range of motion. Neck supple.  Cardiovascular: Normal rate, regular rhythm and normal heart sounds.   Pulmonary/Chest: Effort normal and breath sounds normal. He exhibits no tenderness.  Abdominal: Soft. Bowel sounds are normal. There is no tenderness. There is no rebound.  Musculoskeletal: Normal range of motion.  Neurological: He is alert and oriented to person, place, and time. No cranial nerve deficit. He exhibits normal muscle tone. Coordination normal.       Coordination as assessed with finger to nose intact  Skin: Skin is warm and dry. He is not diaphoretic.  Psychiatric: He has a normal mood and affect.    ED Course  Procedures (including critical care time)   Date: 01/03/2012  Rate: 80  Rhythm: normal sinus rhythm  QRS Axis: left  Intervals: normal  ST/T Wave abnormalities: nonspecific ST/T changes  Conduction Disutrbances:none  Narrative Interpretation:   Old EKG Reviewed: as compared with April 2010, rate decreased    Labs Reviewed  CBC - Abnormal; Notable for the following:    Hemoglobin 12.9 (*)    MCH 25.4 (*)    RDW 15.9 (*)    All other components within normal limits  BASIC METABOLIC PANEL - Abnormal; Notable for the following:    Glucose, Bld 177 (*)    BUN 24 (*)    GFR calc non Af Amer 80 (*)    All other components within normal limits  PRO B NATRIURETIC PEPTIDE - Abnormal; Notable for the following:    Pro B Natriuretic peptide (BNP) 1160.0 (*)    All other components within normal limits    GLUCOSE, CAPILLARY - Abnormal; Notable for the following:    Glucose-Capillary 166 (*)    All other components within normal limits  DIFFERENTIAL  ETHANOL  TROPONIN I  URINALYSIS, ROUTINE W REFLEX MICROSCOPIC  URINE RAPID DRUG SCREEN (HOSP PERFORMED)   Dg Chest 2 View  01/03/2012  *RADIOLOGY REPORT*  Clinical Data: Syncope, vertigo and nausea.  CHEST - 2 VIEW  Comparison: Plain films of the chest 10/22/2008.  CT chest 11/05/2008.  Findings: There is cardiomegaly but no edema.  No pneumothorax or effusion.  IMPRESSION: Cardiomegaly without acute disease.  Original Report Authenticated By: Arvid Right. Luther Parody, M.D.   Ct Head Wo Contrast  01/03/2012  *RADIOLOGY REPORT*  Clinical Data: Syncopal episodes today.  Vertigo.  Nausea.  CT HEAD WITHOUT CONTRAST  Technique:  Contiguous axial images were obtained from the base of the skull through the vertex without contrast.  Comparison: None.  Findings: Normal appearing cerebral hemispheres and posterior fossa structures.  Normal size and position of the ventricles.  No intracranial hemorrhage, mass lesion or CT evidence of acute infarction.  Right anterior ethmoid and bilateral maxillary sinus retention cysts.  Unremarkable bones.  IMPRESSION: No acute abnormality.  Original Report Authenticated By: Gerald Stabs, M.D.     No diagnosis found. 1) syncope    MDM  Pt with syncopal episode today without antecedent prodrome. Hx CAD, s/p CABG. Has had similar episodes over the past several months, which have usually been with walking or climbing stairs. ECG with no gross changes as compared with previous. Negative troponin. However, syncopal episodes typically occur with exertion and have no antecedent features. Page placed to cardiology to discuss.  Discussed with on call MD for [place] at 1745 - he will consult and admit pt.       Abran Richard, [place] 01/03/12 [phone]

## 2012-01-04 ENCOUNTER — Encounter (HOSPITAL_COMMUNITY)
Admission: EM | Disposition: A | Discharge status: Home / Self Care | Payer: Self-pay | Source: Ambulatory Visit | Attending: Cardiology

## 2012-01-04 DIAGNOSIS — R55 Syncope and collapse: Principal | ICD-10-CM

## 2012-01-04 DIAGNOSIS — I509 Heart failure, unspecified: Secondary | ICD-10-CM

## 2012-01-04 DIAGNOSIS — I2510 Atherosclerotic heart disease of native coronary artery without angina pectoris: Secondary | ICD-10-CM

## 2012-01-04 DIAGNOSIS — I059 Rheumatic mitral valve disease, unspecified: Secondary | ICD-10-CM

## 2012-01-04 HISTORY — PX: LEFT HEART CATHETERIZATION WITH CORONARY ANGIOGRAM: SHX5451

## 2012-01-04 LAB — GLUCOSE, CAPILLARY
Glucose-Capillary: 182 mg/dL — ABNORMAL HIGH (ref 70–99)
Glucose-Capillary: 135 mg/dL — ABNORMAL HIGH (ref 70–99)
Glucose-Capillary: 175 mg/dL — ABNORMAL HIGH (ref 70–99)

## 2012-01-04 LAB — TSH: TSH: 1.013 u[IU]/mL (ref 0.350–4.500)

## 2012-01-04 LAB — BASIC METABOLIC PANEL
Chloride: 103 mEq/L (ref 96–112)
Creatinine, Ser: 1 mg/dL (ref 0.50–1.35)
GFR calc Af Amer: >90 mL/min (ref >90)
GFR calc non Af Amer: 85 mL/min — ABNORMAL LOW (ref >90)

## 2012-01-04 LAB — CBC
HCT: 38.1 % — ABNORMAL LOW (ref 39.0–52.0)
Hemoglobin: 12.2 g/dL — ABNORMAL LOW (ref 13.0–17.0)
MCV: 79 fL (ref 78.0–100.0)
RBC: 4.82 MIL/uL (ref 4.22–5.81)
WBC: 7.7 10*3/uL (ref 4.0–10.5)

## 2012-01-04 LAB — CARDIAC PANEL(CRET KIN+CKTOT+MB+TROPI): Relative Index: 1.5 (ref 0.0–2.5)

## 2012-01-04 SURGERY — LEFT HEART CATHETERIZATION WITH CORONARY ANGIOGRAM

## 2012-01-04 MED ORDER — SODIUM CHLORIDE 0.9 % IV SOLN: 250.0000 mL | INTRAVENOUS | Status: DC

## 2012-01-04 MED ORDER — ACETAMINOPHEN 325 MG PO TABS: 650.0000 mg | ORAL_TABLET | ORAL | Status: DC | PRN

## 2012-01-04 MED ORDER — SODIUM CHLORIDE 0.9 % IJ SOLN: 3.0000 mL | INTRAMUSCULAR | Status: DC | PRN

## 2012-01-04 MED ORDER — HEPARIN (PORCINE) IN NACL 2-0.9 UNIT/ML-% IJ SOLN
INTRAMUSCULAR | Status: AC
  Filled 2012-01-04: qty 2000

## 2012-01-04 MED ORDER — LIDOCAINE HCL (PF) 1 % IJ SOLN
INTRAMUSCULAR | Status: AC
  Filled 2012-01-04: qty 30

## 2012-01-04 MED ORDER — FUROSEMIDE 10 MG/ML IJ SOLN
INTRAMUSCULAR | Status: AC
  Administered 2012-01-04: 80 mg via INTRAVENOUS
  Filled 2012-01-04: qty 4

## 2012-01-04 MED ORDER — NITROGLYCERIN 0.2 MG/ML ON CALL CATH LAB
INTRAVENOUS | Status: AC
  Filled 2012-01-04: qty 1

## 2012-01-04 MED ORDER — ASPIRIN EC 325 MG PO TBEC: 325.0000 mg | DELAYED_RELEASE_TABLET | Freq: Every day | ORAL | Status: DC

## 2012-01-04 MED ORDER — MIDAZOLAM HCL 2 MG/2ML IJ SOLN
INTRAMUSCULAR | Status: AC
  Filled 2012-01-04: qty 2

## 2012-01-04 MED ORDER — SODIUM CHLORIDE 0.9 % IJ SOLN
3.0000 mL | Freq: Two times a day (BID) | INTRAMUSCULAR | Status: DC
  Administered 2012-01-06: 3 mL via INTRAVENOUS

## 2012-01-04 MED ORDER — ONDANSETRON HCL 4 MG/2ML IJ SOLN: 4.0000 mg | Freq: Four times a day (QID) | INTRAMUSCULAR | Status: DC | PRN

## 2012-01-04 MED ORDER — OXYCODONE-ACETAMINOPHEN 5-325 MG PO TABS: 1.0000 | ORAL_TABLET | ORAL | Status: DC | PRN

## 2012-01-04 NOTE — Interval H&P Note (Signed)
History and Physical Interval Note:  01/04/2012 11:25 AM  Marcus Johnson  has presented today for surgery, with the diagnosis of cp  The various methods of treatment have been discussed with the patient and family. After consideration of risks, benefits and other options for treatment, the patient has consented to  Procedure(s) (LRB): LEFT HEART CATHETERIZATION WITH CORONARY ANGIOGRAM (N/A) as a surgical intervention .  The patients' history has been reviewed, patient examined, no change in status, stable for surgery.  I have reviewed the patients' chart and labs.  Questions were answered to the patient's satisfaction.     Jenkins Rouge

## 2012-01-04 NOTE — Progress Notes (Signed)
  Echocardiogram 2D Echocardiogram has been performed.  Basilia Jumbo [facility] 01/04/2012, 10:24 AM
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
## 2012-01-04 NOTE — Progress Notes (Signed)
Subjective:  He is somewhat improved, no chest pain.  He says he did not have vertigo before he went down.  This has happened more than once.    Objective:  Vital Signs in the last 24 hours: Temp:  [97.5 F (36.4 C)-99 F (37.2 C)] 98.8 F (37.1 C) (05/31 0801) Pulse Rate:  [80-88] 83  (05/31 0600) Resp:  [14-23] 15  (05/31 0600) BP: (115-161)/(68-102) 129/91 mmHg (05/31 0600) SpO2:  [94 %-100 %] 100 % (05/31 0600) Weight:  [223 lb (101.152 kg)-226 lb (102.513 kg)] 223 lb (101.152 kg) (05/30 2110)  Intake/Output from previous day: 05/30 0701 - 05/31 0700 In: 258 [P.O.:240; I.V.:10; IV Piggyback:8] Out: 3700 [Urine:3700]   Physical Exam: General: Well developed, well nourished, in no acute distress. Head:  Normocephalic and atraumatic. Lungs: Clear to auscultation and percussion. Heart: Normal S1 and S2.  S4 gallop.  Pulses: Pulses normal in all 4 extremities. Extremities: No clubbing or cyanosis. 1 plus edema today.   Neurologic: Alert and oriented x 3.    Lab Results:  Basename 01/04/12 0500 01/03/12 2052  WBC 7.7 8.5  HGB 12.2* 12.7*  PLT 204 189    Basename 01/04/12 0500 01/03/12 2052 01/03/12 1557  NA 140 -- 139  K 4.3 -- 4.3  CL 103 -- 102  CO2 25 -- 26  GLUCOSE 229* -- 177*  BUN 24* -- 24*  CREATININE 1.00 0.96 --    Basename 01/04/12 0500 01/03/12 2053  TROPONINI <0.30 <0.30   Hepatic Function Panel  Basename 01/03/12 2052  PROT 7.3  ALBUMIN 3.4*  AST 34  ALT 28  ALKPHOS 98  BILITOT 1.5*  BILIDIR 0.3  IBILI 1.2*   No results found for this basename: CHOL in the last 72 hours No results found for this basename: PROTIME in the last 72 hours  Imaging: Dg Chest 2 View  01/03/2012  *RADIOLOGY REPORT*  Clinical Data: Syncope, vertigo and nausea.  CHEST - 2 VIEW  Comparison: Plain films of the chest 10/22/2008.  CT chest 11/05/2008.  Findings: There is cardiomegaly but no edema.  No pneumothorax or effusion.  IMPRESSION: Cardiomegaly without acute  disease.  Original Report Authenticated By: Arvid Right. Luther Parody, M.D.   Ct Head Wo Contrast  01/03/2012  *RADIOLOGY REPORT*  Clinical Data: Syncopal episodes today.  Vertigo.  Nausea.  CT HEAD WITHOUT CONTRAST  Technique:  Contiguous axial images were obtained from the base of the skull through the vertex without contrast.  Comparison: None.  Findings: Normal appearing cerebral hemispheres and posterior fossa structures.  Normal size and position of the ventricles.  No intracranial hemorrhage, mass lesion or CT evidence of acute infarction.  Right anterior ethmoid and bilateral maxillary sinus retention cysts.  Unremarkable bones.  IMPRESSION: No acute abnormality.  Original Report Authenticated By: Gerald Stabs, M.D.    EKG:  Left axis deviation. Lateral T inversion.  Some noted on January ECG  Cardiac Studies:  Echo pending  Assessment/Plan:  Patient Active Hospital Problem List: No active hospital problems.  1.  Syncope      Etiology unclear.  Likely needs ICD no matter what is the cause.  Symptoms are worrisome but no premonitory symptoms to suggest arrythmia.  2D echo pending today.  May plan cath later today.  2.  Prior CABG      No chest pain, and findings not clearly suggestive of ischemic etiology.  Plan cath later today.   3.  Proteinuria/hematuria--recheck UA.  4.  Other issues as  noted.      Bing Quarry, MD, [facility], [facility] 01/04/2012, 9:23 AM

## 2012-01-04 NOTE — Care Management Note (Addendum)
    Page 1 of 1   01/07/2012     2:43:30 PM   CARE MANAGEMENT NOTE 01/07/2012  Patient:  Marcus Johnson, Marcus Johnson   Account Number:  [id number]  Date Initiated:  01/04/2012  Documentation initiated by:  Elissa Hefty  Subjective/Objective Assessment:   adm w syncope, hx cad,hx chf     Action/Plan:   lives alone, pcp dr Annie Main fry   Anticipated DC Date:  01/09/2012   Anticipated DC Plan:  [place]  CM consult      Choice offered to / List presented to:             Status of service:   Medicare Important Message given?   (If response is "NO", the following Medicare IM given date fields will be blank) Date Medicare IM given:   Date Additional Medicare IM given:    Discharge Disposition:    Per UR Regulation:  Reviewed for med. 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 pt to make some calls after placemnt of aicd.  5/31 8:22a debbie Elmira Olkowski rn,bsn [id number]

## 2012-01-04 NOTE — CV Procedure (Signed)
   Cardiac Catheterization Procedure Note  Name: Marcus Johnson MRN: [id number] DOB: 02-05-1961  Procedure: Left Heart Cath, Selective Coronary Angiography, LV angiography  Indication: Ischemic DCM with syncope and chest pain   Procedural details: The right groin was prepped, draped, and anesthetized with 1% lidocaine. Using modified Seldinger technique, a 5 French sheath was introduced into the right femoral artery. Standard Judkins catheters were used for coronary angiography and left ventriculography. Catheter exchanges were performed over a guidewire. There were no immediate procedural complications. The patient was transferred to the post catheterization recovery area for further monitoring.  Procedural Findings: Hemodynamics:  AO 116 74 LV 118 17 37   Coronary angiography: Coronary dominance: right  Left mainstem:80% distal  Left anterior descending (LAD): subtotally occluded in mid vessel with some competitive flow from LIMA to IM graft  Left circumflex (LCx): 100% occluded proximally  Right coronary artery (RCA):  50% proximal and mid.  ? 100% mid occlusion but high take of PDA with 70% multiple lesions.  This vessel Is not bypassed.  Supplies collaterals to LAD.  Large RV branch  LIMA: to IM/Ramus patent.  Ramus small diffusely diseased vessel SVG: OM normal SVG: Two small PLA branches normal.  EF 10% by echo  NO LV due to elevated EDP  Final Conclusions:  Reviewed films with Dr Lia Foyer.  Did not think any percutaneous intervention possible.  Will refer for BiVentricular AICD Patient has had known EF less than 25% since bypass and LAD not bypassed as myocuardium was scarred and nonviable.    Patient tolerated procedure well.  Jenkins Rouge 12:02 PM 01/04/2012     Jenkins Rouge 01/04/2012, 11:46 AM

## 2012-01-05 LAB — CBC
HCT: 37.3 % — ABNORMAL LOW (ref 39.0–52.0)
Hemoglobin: 12.2 g/dL — ABNORMAL LOW (ref 13.0–17.0)
RDW: 15.8 % — ABNORMAL HIGH (ref 11.5–15.5)
WBC: 6.9 10*3/uL (ref 4.0–10.5)

## 2012-01-05 LAB — BASIC METABOLIC PANEL
Chloride: 100 mEq/L (ref 96–112)
GFR calc Af Amer: >90 mL/min (ref >90)
GFR calc non Af Amer: >90 mL/min (ref >90)
Potassium: 4 mEq/L (ref 3.5–5.1)

## 2012-01-05 MED ORDER — WHITE PETROLATUM GEL
Status: AC
  Administered 2012-01-05: 12:00:00
  Filled 2012-01-05: qty 5

## 2012-01-05 MED ORDER — FUROSEMIDE 40 MG PO TABS
40.0000 mg | ORAL_TABLET | Freq: Two times a day (BID) | ORAL | Status: DC
  Administered 2012-01-05: 40 mg via ORAL
  Filled 2012-01-05 (×3): qty 1
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
## 2012-01-06 LAB — GLUCOSE, CAPILLARY
Glucose-Capillary: 146 mg/dL — ABNORMAL HIGH (ref 70–99)
Glucose-Capillary: 149 mg/dL — ABNORMAL HIGH (ref 70–99)

## 2012-01-06 LAB — BASIC METABOLIC PANEL
BUN: 23 mg/dL (ref 6–23)
CO2: 24 mEq/L (ref 19–32)
GFR calc non Af Amer: >90 mL/min (ref >90)
Glucose, Bld: 190 mg/dL — ABNORMAL HIGH (ref 70–99)
Potassium: 3.9 mEq/L (ref 3.5–5.1)

## 2012-01-06 LAB — PRO B NATRIURETIC PEPTIDE: Pro B Natriuretic peptide (BNP): 583.1 pg/mL — ABNORMAL HIGH (ref 0–125)

## 2012-01-06 MED ORDER — FUROSEMIDE 80 MG PO TABS
80.0000 mg | ORAL_TABLET | Freq: Two times a day (BID) | ORAL | Status: DC
  Administered 2012-01-06 – 2012-01-08 (×4): 80 mg via ORAL
  Filled 2012-01-06 (×7): qty 1

## 2012-01-06 NOTE — Progress Notes (Signed)
Marcus Real, MD, [facility] Board Certified in Adult Cardiovascular Medicine,Internal Medicine and Critical Care Medicine      Subjective:    Patient is doing well.  He has no specific cardiac complaints.  We adjusted his Lasix to 80 mg by mouth twice a day.  He is awaiting placement of ICD tomorrow.    Objective:   Weight Range:  Vital Signs:   Temp:  [97.9 F (36.6 C)-98.5 F (36.9 C)] 97.9 F (36.6 C) (06/02 0800) Resp:  [13-22] 16  (06/02 0600) BP: (101-128)/(63-74) 128/64 mmHg (06/02 0800) SpO2:  [93 %-98 %] 96 % (06/02 0800) Weight:  [220 lb 0.3 oz (99.8 kg)] 220 lb 0.3 oz (99.8 kg) (06/02 0600) Last BM Date: 01/03/12  Weight change: Filed Weights   01/03/12 2110 01/05/12 0534 01/06/12 0600  Weight: 223 lb (101.152 kg) 214 lb 6.4 oz (97.251 kg) 220 lb 0.3 oz (99.8 kg)    Intake/Output:   Intake/Output Summary (Last 24 hours) at 01/06/12 1153 Last data filed at 01/06/12 1000  Gross per 24 hour  Intake    600 ml  Output   2750 ml  Net  -2150 ml     Physical Exam: Gen.: Well-nourished African American male in no distress  Neck normal carotid upstroke and no carotid bruits  Lungs: Clear breath sounds  with no wheezing  Heart: Regular rate and rhythm normal S1 and S2 no pathological murmurs  Extremity exam no cyanosis or clubbing trace edema  Skin is warm and dry  Neurologic: Alert and oriented x3.   Telemetry: NSR Labs: Basic Metabolic Panel:  Lab [id number] 01/05/12 0810 01/04/12 0500 01/03/12 2052 01/03/12 1557  NA 139 138 140 -- 139  K 3.9 4.0 4.3 -- 4.3  CL 103 100 103 -- 102  CO2 24 26 25  -- 26  GLUCOSE 190* 157* 229* -- 177*  BUN 23 23 24* -- 24*  CREATININE 0.87 0.94 1.00 0.96 1.05  CALCIUM 9.1 9.1 9.1 -- --  MG -- -- -- -- --  PHOS -- -- -- -- --    Liver Function Tests:  Lab 01/03/12 2052  AST 34  ALT 28  ALKPHOS 98  BILITOT 1.5*  PROT 7.3  ALBUMIN 3.4*   No results found for this basename: LIPASE:5,AMYLASE:5 in the last  168 hours No results found for this basename: AMMONIA:3 in the last 168 hours  CBC:  Lab 01/05/12 0810 01/04/12 0500 01/03/12 2052 01/03/12 1557  WBC 6.9 7.7 8.5 9.4  NEUTROABS -- -- -- 5.7  HGB 12.2* 12.2* 12.7* 12.9*  HCT 37.3* 38.1* 39.0 40.6  MCV 78.4 79.0 78.8 79.9  PLT 187 204 189 203    Cardiac Enzymes:  Lab 01/04/12 1232 01/04/12 0500 01/03/12 2053 01/03/12 2010 01/03/12 1617  CKTOTAL 324* 406* 474* -- --  CKMB 5.4* 6.2* 8.0* -- --  CKMBINDEX -- -- -- -- --  TROPONINI <0.30 <0.30 <0.30 <0.30 <0.30     BNP: BNP (last 3 results)  Basename 01/03/12 1617  PROBNP 1160.0*    ABG    Component Value Date/Time   TCO2 24 11/05/2008 0704     Other results: EKG:NA Imaging:  No results found.   Medications:     Scheduled Medications:    . aspirin  325 mg Oral Daily  . atorvastatin  40 mg Oral QHS  . carvedilol  6.25 mg Oral BID WC  . furosemide  80 mg Oral BID  . heparin  5,000  Units Subcutaneous Q8H  . insulin aspart  0-15 Units Subcutaneous TID WC  . insulin aspart  0-5 Units Subcutaneous QHS  . lisinopril  5 mg Oral BID  . potassium chloride  20 mEq Oral BID  . pregabalin  75 mg Oral BID  . sodium chloride  3 mL Intravenous Q12H  . sodium chloride  3 mL Intravenous Q12H  . white petrolatum      . DISCONTD: furosemide  80 mg Intravenous BID  . DISCONTD: furosemide  40 mg Oral BID     Infusions:    . sodium chloride Stopped (01/04/12 1946)     PRN Medications:  sodium chloride, acetaminophen, acetaminophen, ondansetron (ZOFRAN) IV, ondansetron (ZOFRAN) IV, oxyCODONE-acetaminophen, sodium chloride, sodium chloride   Assessment:  #1 syncope, recurrent  #2 coronary artery disease status post coronary artery bypass grafting 2002  #3 ischemic cardiomyopathy ejection fraction 10%  #4 status post cardiac catheterization 01/04/2012 obstructive CAD not amenable to PTCA/PCI medical therapy recommended  #4 peripheral neuropathy secondary to diabetes   #5 diabetes mellitus on insulin  #6History of MRSA  #7 history of medication noncompliance  #8 iNT pro BNP level normal for age on admission  #9 moderate RV dysfunction  #10 moderate to severe pulmonary hypertension PA systolic pressure 60 mmHg   Plan/Discussion:    Patient scheduled for ICD implantation.  Narrow QRS. Not crt-d candidate  Of note is that the patient also has RV dysfunction and pulmonary hypertension  I discontinued intravenous Lasix and we will switch him back to his home regimen of 80 mg by mouth twice a day    Length of Stay: [place] 01/06/2012, 11:53 AM

## 2012-01-07 ENCOUNTER — Encounter (HOSPITAL_COMMUNITY)
Admission: EM | Disposition: A | Discharge status: Home / Self Care | Payer: Self-pay | Source: Ambulatory Visit | Attending: Cardiology

## 2012-01-07 DIAGNOSIS — I2589 Other forms of chronic ischemic heart disease: Secondary | ICD-10-CM

## 2012-01-07 HISTORY — PX: IMPLANTABLE CARDIOVERTER DEFIBRILLATOR IMPLANT: SHX5473

## 2012-01-07 LAB — GLUCOSE, CAPILLARY
Glucose-Capillary: 164 mg/dL — ABNORMAL HIGH (ref 70–99)
Glucose-Capillary: 210 mg/dL — ABNORMAL HIGH (ref 70–99)
Glucose-Capillary: 132 mg/dL — ABNORMAL HIGH (ref 70–99)

## 2012-01-07 LAB — CBC
HCT: 37.7 % — ABNORMAL LOW (ref 39.0–52.0)
MCH: 25.7 pg — ABNORMAL LOW (ref 26.0–34.0)
MCV: 78.2 fL (ref 78.0–100.0)
RBC: 4.82 MIL/uL (ref 4.22–5.81)
WBC: 6.2 10*3/uL (ref 4.0–10.5)

## 2012-01-07 LAB — BASIC METABOLIC PANEL
CO2: 26 mEq/L (ref 19–32)
Calcium: 9.6 mg/dL (ref 8.4–10.5)
Chloride: 100 mEq/L (ref 96–112)
Glucose, Bld: 156 mg/dL — ABNORMAL HIGH (ref 70–99)
Sodium: 139 mEq/L (ref 135–145)

## 2012-01-07 SURGERY — IMPLANTABLE CARDIOVERTER DEFIBRILLATOR IMPLANT
Anesthesia: LOCAL

## 2012-01-07 MED ORDER — CHLORHEXIDINE GLUCONATE 4 % EX LIQD: 60.0000 mL | Freq: Once | CUTANEOUS | Status: DC

## 2012-01-07 MED ORDER — ASPIRIN 325 MG PO TABS: 81.0000 mg | ORAL_TABLET | Freq: Every day | ORAL | Status: DC

## 2012-01-07 MED ORDER — SODIUM CHLORIDE 0.9 % IJ SOLN: 3.0000 mL | INTRAMUSCULAR | Status: DC | PRN

## 2012-01-07 MED ORDER — CEFAZOLIN SODIUM 1-5 GM-% IV SOLN
1.0000 g | Freq: Four times a day (QID) | INTRAVENOUS | Status: AC
  Administered 2012-01-07 – 2012-01-08 (×3): 1 g via INTRAVENOUS
  Filled 2012-01-07 (×3): qty 50

## 2012-01-07 MED ORDER — SODIUM CHLORIDE 0.9 % IR SOLN
80.0000 mg | Status: DC
  Filled 2012-01-07: qty 2

## 2012-01-07 MED ORDER — ASPIRIN EC 81 MG PO TBEC
162.0000 mg | DELAYED_RELEASE_TABLET | Freq: Every day | ORAL | Status: DC
  Administered 2012-01-08: 162 mg via ORAL
  Filled 2012-01-07: qty 2

## 2012-01-07 MED ORDER — SODIUM CHLORIDE 0.9 % IJ SOLN: 3.0000 mL | Freq: Two times a day (BID) | INTRAMUSCULAR | Status: DC

## 2012-01-07 MED ORDER — ACETAMINOPHEN 325 MG PO TABS: 325.0000 mg | ORAL_TABLET | ORAL | Status: DC | PRN

## 2012-01-07 MED ORDER — SODIUM CHLORIDE 0.9 % IV SOLN
250.0000 mL | INTRAVENOUS | Status: DC
  Administered 2012-01-07: 16:00:00 via INTRAVENOUS

## 2012-01-07 MED ORDER — FENTANYL CITRATE 0.05 MG/ML IJ SOLN
INTRAMUSCULAR | Status: AC
  Filled 2012-01-07: qty 2

## 2012-01-07 MED ORDER — MIDAZOLAM HCL 5 MG/5ML IJ SOLN
INTRAMUSCULAR | Status: AC
  Filled 2012-01-07: qty 5

## 2012-01-07 MED ORDER — LIDOCAINE HCL (PF) 1 % IJ SOLN
INTRAMUSCULAR | Status: AC
  Filled 2012-01-07: qty 60

## 2012-01-07 MED ORDER — SODIUM CHLORIDE 0.45 % IV SOLN
INTRAVENOUS | Status: DC
  Administered 2012-01-07: 16:00:00 via INTRAVENOUS

## 2012-01-07 MED ORDER — ONDANSETRON HCL 4 MG/2ML IJ SOLN: 4.0000 mg | Freq: Four times a day (QID) | INTRAMUSCULAR | Status: DC | PRN

## 2012-01-07 MED ORDER — SODIUM CHLORIDE 0.9 % IV SOLN
INTRAVENOUS | Status: AC
  Filled 2012-01-07: qty 250

## 2012-01-07 MED ORDER — SODIUM CHLORIDE 0.9 % IV SOLN: INTRAVENOUS | Status: DC

## 2012-01-07 MED ORDER — CEFAZOLIN SODIUM 1-5 GM-% IV SOLN
INTRAVENOUS | Status: AC
  Administered 2012-01-08: 1 g via INTRAVENOUS
  Filled 2012-01-07: qty 50

## 2012-01-07 MED ORDER — CEFAZOLIN SODIUM 1-5 GM-% IV SOLN
INTRAVENOUS | Status: AC
  Filled 2012-01-07: qty 50

## 2012-01-07 MED ORDER — CEFAZOLIN SODIUM-DEXTROSE 2-3 GM-% IV SOLR
2.0000 g | INTRAVENOUS | Status: DC
  Filled 2012-01-07: qty 50

## 2012-01-07 MED ORDER — HEPARIN (PORCINE) IN NACL 2-0.9 UNIT/ML-% IJ SOLN
INTRAMUSCULAR | Status: AC
  Filled 2012-01-07: qty 1000

## 2012-01-07 MED ORDER — YOU HAVE A PACEMAKER BOOK
Freq: Once | Status: AC
  Administered 2012-01-07: 22:00:00
  Filled 2012-01-07: qty 1

## 2012-01-07 NOTE — Interval H&P Note (Signed)
History and Physical Interval Note:  01/07/2012 1:51 PM  Marcus Johnson  has presented today for surgery, with the diagnosis of Heart Block  The various methods of treatment have been discussed with the patient and family. After consideration of risks, benefits and other options for treatment, the patient has consented to  Procedure(s) (LRB): IMPLANTABLE CARDIOVERTER DEFIBRILLATOR IMPLANT (N/A) as a surgical intervention .  The patients' history has been reviewed, patient examined, no change in status, stable for surgery.  I have reviewed the patients' chart and labs.  Questions were answered to the patient's satisfaction.     Virl Axe  Well developed and nourished in no acute distress HENT normal Neck supple with JVP-flat Clear Regular rate and rhythm, no murmurs or gallops Abd-soft with active BS No Clubbing cyanosis edema Skin-warm and dry A & Oriented  Grossly normal sensory and motor function   Have reviewed the potential benefits and risks of ICD implantation including but not limited to death, perforation of heart or lung, lead dislodgement, infection,  device malfunction and inappropriate shocks.  Thefamily express understanding  and are willing to proceed.

## 2012-01-07 NOTE — H&P (View-Only) (Signed)
Subjective: No SOB or CP. Objective: Filed Vitals:   01/06/12 2133 01/06/12 2352 01/07/12 0345 01/07/12 0435  BP: 104/79 93/57 98/60  119/72  Pulse:      Temp:  98.1 F (36.7 C) 98.1 F (36.7 C)   TempSrc:  Oral Oral   Resp:  16 16   Height:      Weight:      SpO2:  95% 95% 95%   Weight change:   Intake/Output Summary (Last 24 hours) at 01/07/12 0801 Last data filed at 01/07/12 0700  Gross per 24 hour  Intake    243 ml  Output   2100 ml  Net  -1857 ml    General: Alert, awake, oriented x3, in no acute distress Neck:  JVP is normal Heart: Regular rate and rhythm, without murmurs, rubs, gallops.  Lungs: Clear to auscultation.  No rales or wheezes. Exemities:  No edema.   Neuro: Grossly intact, nonfocal.  Tele:  SR Lab Results: Results for orders placed during the hospital encounter of 01/03/12 (from the past 24 hour(s))  GLUCOSE, CAPILLARY     Status: Abnormal   Collection Time   01/06/12  8:12 AM      Component Value Range   Glucose-Capillary 146 (*) 70 - 99 (mg/dL)  GLUCOSE, CAPILLARY     Status: Abnormal   Collection Time   01/06/12 12:25 PM      Component Value Range   Glucose-Capillary 168 (*) 70 - 99 (mg/dL)  GLUCOSE, CAPILLARY     Status: Abnormal   Collection Time   01/06/12  5:21 PM      Component Value Range   Glucose-Capillary 111 (*) 70 - 99 (mg/dL)  GLUCOSE, CAPILLARY     Status: Abnormal   Collection Time   01/06/12  9:17 PM      Component Value Range   Glucose-Capillary 149 (*) 70 - 99 (mg/dL)    Studies/Results: No results found.  Medications: I have reviewed the patient's current medications.  Assessment:  1 syncope, recurrent  #2 coronary artery disease status post coronary artery bypass grafting 2002  #3 ischemic cardiomyopathy ejection fraction 10%     Volume status looks pretty good.  Will recheck labs. #4 status post cardiac catheterization 01/04/2012 obstructive CAD not amenable to PTCA/PCI medical therapy recommended   Patient  asymptomatic. #4 peripheral neuropathy secondary to diabetes  #5 diabetes mellitus on insulin  #6History of MRSA  #7 history of medication noncompliance  #8 iNT pro BNP level normal for age on admission  #9 moderate RV dysfunction  #10 moderate to severe pulmonary hypertension PA systolic pressure 60 mmHg    Plan:   Plan for AICD today.  Dorris Carnes 01/07/2012, 8:01 AM

## 2012-01-07 NOTE — Progress Notes (Signed)
Subjective: No SOB or CP. Objective: Filed Vitals:   01/06/12 2133 01/06/12 2352 01/07/12 0345 01/07/12 0435  BP: 104/79 93/57 98/60  119/72  Pulse:      Temp:  98.1 F (36.7 C) 98.1 F (36.7 C)   TempSrc:  Oral Oral   Resp:  16 16   Height:      Weight:      SpO2:  95% 95% 95%   Weight change:   Intake/Output Summary (Last 24 hours) at 01/07/12 0801 Last data filed at 01/07/12 0700  Gross per 24 hour  Intake    243 ml  Output   2100 ml  Net  -1857 ml    General: Alert, awake, oriented x3, in no acute distress Neck:  JVP is normal Heart: Regular rate and rhythm, without murmurs, rubs, gallops.  Lungs: Clear to auscultation.  No rales or wheezes. Exemities:  No edema.   Neuro: Grossly intact, nonfocal.  Tele:  SR Lab Results: Results for orders placed during the hospital encounter of 01/03/12 (from the past 24 hour(s))  GLUCOSE, CAPILLARY     Status: Abnormal   Collection Time   01/06/12  8:12 AM      Component Value Range   Glucose-Capillary 146 (*) 70 - 99 (mg/dL)  GLUCOSE, CAPILLARY     Status: Abnormal   Collection Time   01/06/12 12:25 PM      Component Value Range   Glucose-Capillary 168 (*) 70 - 99 (mg/dL)  GLUCOSE, CAPILLARY     Status: Abnormal   Collection Time   01/06/12  5:21 PM      Component Value Range   Glucose-Capillary 111 (*) 70 - 99 (mg/dL)  GLUCOSE, CAPILLARY     Status: Abnormal   Collection Time   01/06/12  9:17 PM      Component Value Range   Glucose-Capillary 149 (*) 70 - 99 (mg/dL)    Studies/Results: No results found.  Medications: I have reviewed the patient's current medications.  Assessment:  1 syncope, recurrent  #2 coronary artery disease status post coronary artery bypass grafting 2002  #3 ischemic cardiomyopathy ejection fraction 10%     Volume status looks pretty good.  Will recheck labs. #4 status post cardiac catheterization 01/04/2012 obstructive CAD not amenable to PTCA/PCI medical therapy recommended   Patient  asymptomatic. #4 peripheral neuropathy secondary to diabetes  #5 diabetes mellitus on insulin  #6History of MRSA  #7 history of medication noncompliance  #8 iNT pro BNP level normal for age on admission  #9 moderate RV dysfunction  #10 moderate to severe pulmonary hypertension PA systolic pressure 60 mmHg    Plan:   Plan for AICD today.  Dorris Carnes 01/07/2012, 8:01 AM

## 2012-01-07 NOTE — CV Procedure (Addendum)
preop diagnosis: ischemic CM with syncope Postop Dx: same  Proc contrast venogram; ICD implantation  Dictation numbger [phone]

## 2012-01-08 ENCOUNTER — Inpatient Hospital Stay (HOSPITAL_COMMUNITY): Payer: BC Managed Care – PPO

## 2012-01-08 ENCOUNTER — Encounter: Payer: Self-pay | Admitting: *Deleted

## 2012-01-08 DIAGNOSIS — Z95810 Presence of automatic (implantable) cardiac defibrillator: Secondary | ICD-10-CM | POA: Insufficient documentation

## 2012-01-08 MED ORDER — CARVEDILOL 6.25 MG PO TABS: 6.2500 mg | ORAL_TABLET | Freq: Two times a day (BID) | ORAL | Status: DC

## 2012-01-08 MED ORDER — LISINOPRIL 5 MG PO TABS: 5.0000 mg | ORAL_TABLET | Freq: Two times a day (BID) | ORAL | Status: DC

## 2012-01-08 NOTE — Discharge Instructions (Addendum)
Supplemental Discharge Instructions for  Pacemaker/Defibrillator Patients  Activity No heavy lifting or vigorous activity with your left/right arm for 6 to 8 weeks.  Do not raise your left/right arm above your head for one week.  Gradually raise your affected arm as drawn below.           06/06                       06/07                       06/08                      06/09       NO DRIVING for 1 week; you may begin driving on [id number]. WOUND CARE   Keep the wound area clean and dry.  Do not get this area wet for one week. No showers for one week; you may shower on 01/15/2012.   The tape/steri-strips on your wound will fall off; do not pull them off.  No bandage is needed on the site.  DO  NOT apply any creams, oils, or ointments to the wound area.   If you notice any drainage or discharge from the wound, any swelling or bruising at the site, or you develop a fever > 101? F after you are discharged home, call the office at once.  Special Instructions   You are still able to use cellular telephones; use the ear opposite the side where you have your pacemaker/defibrillator.  Avoid carrying your cellular phone near your device.   When traveling through airports, show security personnel your identification card to avoid being screened in the metal detectors.  Ask the security personnel to use the hand wand.   Avoid arc welding equipment, MRI testing (magnetic resonance imaging), TENS units (transcutaneous nerve stimulators).  Call the office for questions about other devices.   Avoid electrical appliances that are in poor condition or are not properly grounded.   Microwave ovens are safe to be near or to operate.  Additional information for defibrillator patients should your device go off:   If your device goes off ONCE and you feel fine afterward, notify the device clinic nurses.   If your device goes off ONCE and you do not feel well afterward, call 911.   If your device goes off  TWICE, call 911.   If your device goes off THREE times in one day, call 911.  DO NOT DRIVE YOURSELF OR A FAMILY MEMBER WITH A DEFIBRILLATOR TO THE HOSPITAL--CALL 911.Cardioverter Defibrillator Implantation The implantable cardioverter defibrillator (ICD) is a device that monitors the heart rhythm. If it detects a dangerous heart rhythm, it either electrically makes the heart beat faster (pacing), or delivers a small electrical shock. The patient may not feel the pacing, but the shock may be felt as a strong jolt in the chest. The ICD is normally used to quickly fix heart rhythms that cannot wait for an ambulance. Rhythms in which the heart has stopped pumping must be changed as quickly as possible. An ICD may be used for other problems such as:  Less dangerous abnormal heart rhythms.   Assisting weakened heart muscle.   Damage and high risk after a heart attack.  A newer ICD called bi-ventricular (Bi-VICD) uses an extra wire to help both sides of the heart work together. This means that the heart can pump more blood with  less effort. Reducing the work of the heart is important to improve the quality of life for people with heart failure.  PROCEDURE   The ICD is usually placed under the skin near the collarbone, while you are under sedation. An abdominal wall location may be another option.   Pacer wires are inserted into a vein that lies just under the collarbone, then guided into place under x-ray. The tips of the wires touch the inside of the heart. The near end of the pacer wires are connected to the ICD generator (battery pack and pacemaker) under the skin beneath the collarbone.   In thinner chest walled individuals, it is possible to feel the ICD under the skin, and a slight bump may be seen.   The pacer wires or leads report the heart's electrical activity back to the ICD and deliver electrical therapy, if needed.   The ICD can act like a standard pacemaker and pace the heart if it  beats too slowly.  HOME CARE INSTRUCTIONS   Do not shower for a week after the procedure to keep the incision dry.   Use the arm on the side the ICD was placed as little as possible for at least a week.   Keep small electrical devices at least 6 inches away from the ICD and use your cell phone on the opposite side of your body.   Only take over-the-counter or prescription medicines for pain, discomfort, or fever as directed by your caregiver.  IMPORTANT WARNINGS  You must stay away from strong electromagnetic devices. MRI (magnetic resonance imaging) scans cannot be performed on patients with ICD's.   Your ICD must be monitored regularly. A device placed on the skin over the ICD can check (interrogate) the battery or see how and when any heart rhythms were treated.   The programming of the ICD can be modified to best suit your needs. Routine monitoring will detect a battery that is nearing the end of its lifespan long before it gives out. When this happens, the current device is removed and a new device is implanted. The leads are usually left in place.   An ICD is not perfect. It is always important to have the device checked if you feel a shock. The device or your medicine may need to be adjusted.  Document Released: 04/14/2002 Document Revised: 07/12/2011 Document Reviewed: 09/30/2007 [facility] Patient Information 2012 [place].

## 2012-01-08 NOTE — Progress Notes (Signed)
  Patient Name: Marcus Johnson      SUBJECTIVE: some shoulder sorenss  Issues of work loom [employer]  Past Medical History  Diagnosis Date  . Hypertension   . Coronary artery disease     a) s/p CABG 2002  . CHF (congestive heart failure)     Secondary to ischemic cardiomyopathy with EF 35-40% by cath 2002, last echo 11/2008 - EF 20-25%  . Diabetes mellitus     type 2 requiring insulin   . MRSA (methicillin resistant Staphylococcus aureus)     Status post right foot plantar deep infection with MRSA status post  I&D 10/2008  . Noncompliance   . Peripheral neuropathy   . Myocardial infarction   . Angina   . Peripheral vascular disease   . Shortness of breath     PHYSICAL EXAM Filed Vitals:   01/07/12 2100 01/07/12 2200 01/08/12 0028 01/08/12 0500  BP: 130/78 123/79 127/86 135/83  Pulse: 83 83 81 84  Temp:   98.3 F (36.8 C) 98.7 F (37.1 C)  TempSrc:   Oral Oral  Resp: 13 14 15 16   Height:      Weight:    213 lb 13.5 oz (97 kg)  SpO2: 95% 100%     Well developed and nourished in no acute distress HENT normal Neck supple with JVP-flat Clear Pocket without hematoma Regular rate and rhythm, no murmurs or gallops Abd-soft with active BS No Clubbing cyanosis edema Skin-warm and dry A & Oriented  Grossly normal sensory and motor function  TELEMETRY: Reviewed telemetry pt in nsr :    Intake/Output Summary (Last 24 hours) at 01/08/12 0822 Last data filed at 01/08/12 0500  Gross per 24 hour  Intake    973 ml  Output   2050 ml  Net  -1077 ml    LABS: Basic Metabolic Panel:  Lab [id number] 01/06/12 0705 01/05/12 0810 01/04/12 0500 01/03/12 2052 01/03/12 1557  NA 139 139 138 140 -- 139  K 4.2 3.9 4.0 4.3 -- 4.3  CL 100 103 100 103 -- 102  CO2 26 24 26 25  -- 26  GLUCOSE 156* 190* 157* 229* -- 177*  BUN 21 23 23  24* -- 24*  CREATININE 0.92 0.87 0.94 1.00 0.96 1.05  CALCIUM 9.6 9.1 -- -- -- --  MG -- -- -- -- -- --  PHOS -- -- -- -- -- --   Cardiac Enzymes: No  results found for this basename: CKTOTAL:3,CKMB:3,CKMBINDEX:3,TROPONINI:3 in the last 72 hours CBC:  Lab 01/07/12 0816 01/05/12 0810 01/04/12 0500 01/03/12 2052 01/03/12 1557  WBC 6.2 6.9 7.7 8.5 9.4  NEUTROABS -- -- -- -- 5.7  HGB 12.4* 12.2* 12.2* 12.7* 12.9*  HCT 37.7* 37.3* 38.1* 39.0 40.6  MCV 78.2 78.4 79.0 78.8 79.9  PLT 196 187 204 189 203   PROTIME:  Basename 01/07/12 0816  LABPROT 14.0  INR 1.06     Device Interrogation: device function normal   ASSESSMENT AND PLAN:  Discharge home today on current meds   Signed, Virl Axe MD  01/08/2012

## 2012-01-08 NOTE — Op Note (Signed)
NAME:  Marcus Johnson, Marcus Johnson NO.:  [id number]  MEDICAL RECORD NO.:  [id number]  LOCATION:  2506                         FACILITY:  [facility]  PHYSICIAN:  Deboraha Sprang, MD, FACCDATE OF BIRTH:  March 19, 1961  DATE OF PROCEDURE: DATE OF DISCHARGE:                              OPERATIVE REPORT   PREOPERATIVE DIAGNOSES:  Syncope, ischemic cardiomyopathy.  POSTOPERATIVE DIAGNOSIS:  Syncope, ischemic cardiomyopathy.  PROCEDURE:  Contrast venogram, single-chamber defibrillator implantation.  DESCRIPTION OF PROCEDURE:  Following obtaining informed consent, the patient was brought to the electrophysiology laboratory and placed on the fluoroscopic table in supine position.  After routine prep and drape of the left upper chest, lidocaine was infiltrated in the prepectoral subclavicular region.  Incision was made and carried down to layer of the prepectoral fascia.  Using electrocautery and sharp dissection, a pocket was formed.  Similarly hemostasis was obtained.  Thereafter attention was turned to gain access to the extrathoracic left subclavian vein which was accomplished with moderate difficulty.  In fact, I needed to do a venogram to identify the course of the extrathoracic left subclavian vein.  This having been accomplished, a venipuncture was accomplished.  A guidewire was placed.  A 9-French sheath was placed and through this was passed a Medtronic [id number] dual coil DF4 lead, serial number [id number].  Under fluoroscopic guidance it was moved to the right ventricular apex.  The bipolar R-wave was 10 with a pace impedance of 657, threshold of 0.7 at 0.5.  Current of injury was brisk.  There was no diaphragmatic pacing at 10 V and the current at threshold was about 1 mA.  The lead was secured to the prepectoral fascia and then attached to Medtronic [id number] defibrillator, serial number [id number].  Interrogation through the device demonstrated an R- wave of 9.6 with a pace  impedance of 495 for a threshold of 0.5 at 0.4. High-voltage impedances were approximately 44 distally.  The device was implanted.  The pocket was copiously irrigated with antibiotic containing saline solution.  Hemostasis was assured.  Leads and pulse generator were placed in the pocket, secured to the prepectoral fascia. The wound was then closed in 3 layers in normal fashion.  The wound was washed, dried, and a benzoin and Steri-Strip dressing was applied. Needle counts, sponge counts, and instrument counts were correct at the end of the procedure according to the staff.  The patient tolerated the procedure without apparent complication.     Deboraha Sprang, MD, [facility]     SCK/MEDQ  D:  01/07/2012  T:  01/08/2012  Job:  [id number]

## 2012-01-08 NOTE — Discharge Summary (Signed)
ELECTROPHYSIOLOGY DISCHARGE SUMMARY    Patient ID: Marcus Johnson,  MRN: [id number], DOB/AGE: Mar 09, 1961 51 y.o.  Admit date: 01/03/2012 Discharge date: 01/08/2012  Primary Care Physician: Delma Freeze, MD Primary Cardiologist: Verl Blalock, MD  Primary Discharge Diagnosis:  1. Ischemic cardiomyopathy s/p ICD implantation 2. Syncope 3. CAD  Secondary Discharge Diagnoses:  1. HTN 2. Dyslipidemia 3. DM 4. History of MRSA 5. Medical noncompliance  Procedures This Admission:  1. Left heart catheterization performed by Dr. Johnsie Cancel 01/04/2012 Left mainstem: 80% distal. Left anterior descending (LAD): subtotally occluded in mid vessel with some competitive flow from LIMA to IM graft. Left circumflex (LCx): 100% occluded proximally. Right coronary artery (RCA): 50% proximal and mid. ? 100% mid occlusion but high take of PDA with 70% multiple lesions. This vessel is not bypassed. Supplies collaterals to LAD. Large RV branch. LIMA: to IM/Ramus patent. Ramus small diffusely diseased vessel. SVG: OM normal. SVG: Two small PLA branches normal. EF 10% by echo NO LV due to elevated EDP.  Final Conclusions: Reviewed films with Dr Lia Foyer. Did not think any percutaneous intervention possible. Will refer for AICD.  Patient has had known EF less than 25% since bypass and LAD not bypassed as myocardium was scarred and nonviable.  2. Single chamber ICD implantation performed by Dr. Caryl Comes 01/07/2012 Medtronic [id number] dual coil DF4 lead, serial number [id number], Medtronic [id number] defibrillator, serial  number [id number]  History and Hospital Course:  Marcus Johnson is a 51 year old man with history of CAD s/p CABG 2002, ICM with chronic systolic CHF, DM and prior noncompliance who admitted to [facility] with syncope. He reports this was his 3rd episode of syncope in the last 3 months. 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 He does not  recall much of the episode, stating the only thing he remembers is "waking up with several people standing around him." He denies any warning or prodrome. He denies chest pain, shortness of breath, palpitations or dizziness. He denies loss of bowel or bladder control. He did not suffer any significant injury. His prior episodes have also occurred while walking. On Monday 12/31/2011, he was following his son up the stairs at home. He was near the top of the stairs when he lost consciousness and and states he tried to brace himself but fell forward on the remaining steps. The next thing he knew, his son was calling for him. He got up and went on with his evening but felt experienced nausea with multiple episodes of vomiting later that evening. A similar episode happened while climbing steps one month ago, also associated with nausea. On admission, his 12-lead ECG showed sinus rhythm with no acute ST-T wave abnormalities. Serial troponins were negative. His BNP was elevated at 1160 but he had not been noncompliant with his heart failure medications due to finances. He was taking his medicines 3-4 days per week. Given his known CAD, he underwent LHC which revealed obstructive CAD, not amenable to PTCA/PCI so medical management was recommended. He was then seen in consultation by Dr. Lovena Le who recommended ICD implantation for primary prevention. This was done 01/07/2012. He tolerated the procedure well with no immediate complications. He stayed overnight for observation. He remains hemodynamically stable and afebrile. His device interrogation shows normal ICD function. His chest x-ray shows stable lead placement without pneumothorax. His implant site is intact with no evidence of significant bleeding or hematoma. His medications were  adjusted/changed to help with the cost and he was counseled regarding the importance of medication compliance. He has been seen, examined and deemed stable for discharge by Dr.  Caryl Comes.  Discharge Vitals: Blood pressure 135/83, pulse 84, temperature 98.7 F (37.1 C), temperature source Oral, resp. rate 16, height 5\' 10"  (1.778 m), weight 213 lb 13.5 oz (97 kg), SpO2 100.00%.   Labs: Lab Results  Component Value Date   WBC 6.2 01/07/2012   HGB 12.4* 01/07/2012   HCT 37.7* 01/07/2012   MCV 78.2 01/07/2012   PLT 196 01/07/2012    Lab 01/07/12 0816 01/03/12 2052  NA 139 --  K 4.2 --  CL 100 --  CO2 26 --  BUN 21 --  CREATININE 0.92 --  CALCIUM 9.6 --  PROT -- 7.3  BILITOT -- 1.5*  ALKPHOS -- 98  ALT -- 28  AST -- 34  GLUCOSE 156* --   Lab Results  Component Value Date   CKTOTAL 324* 01/04/2012   CKMB 5.4* 01/04/2012   TROPONINI <0.30 01/04/2012   Disposition:  The patient is being discharged in stable condition.  Follow-up:  Follow up with [place] on 01/16/2012. (At 10:30 AM for wound check)    Contact information:   [address] [place] [phone]    Follow up with Virl Axe, MD on 04/15/2012. (At 10:00 AM)    Contact information:   [address],  [place] [phone]   Discharge Medications:  Medication List  As of 01/08/2012  8:28 AM   STOP taking these medications     metoprolol succinate 100 MG 24 hr tablet      trandolapril 4 MG tablet       TAKE these medications     aspirin 325 MG tablet   Take 325 mg by mouth daily.      atorvastatin 40 MG tablet   Commonly known as: LIPITOR   Take 1 tablet (40 mg total) by mouth daily.      carvedilol 6.25 MG tablet   Commonly known as: COREG   Take 1 tablet (6.25 mg total) by mouth 2 (two) times daily with a meal.      furosemide 40 MG tablet   Commonly known as: LASIX   Take 80 mg by mouth 2 (two) times daily.      insulin aspart protamine-insulin aspart (70-30) 100 UNIT/ML injection   Commonly known as: NOVOLOG 70/30   Inject 20 Units into the skin 2 (two) times daily with a meal.      insulin  glargine 100 UNIT/ML injection   Commonly known as: LANTUS   Inject 40 Units into the skin at bedtime.      lisinopril 5 MG tablet   Commonly known as: PRINIVIL,ZESTRIL   Take 1 tablet (5 mg total) by mouth 2 (two) times daily.      potassium chloride 20 MEQ packet   Commonly known as: KLOR-CON   Take 20 mEq by mouth 2 (two) times daily.      pregabalin 75 MG capsule   Commonly known as: LYRICA   Take 1 capsule (75 mg total) by mouth 2 (two) times daily.      Duration of Discharge Encounter: Greater than 30 minutes including physician time.  Signed, Ileene Hutchinson, PA-C 01/08/2012, 8:28 AM

## 2012-01-16 ENCOUNTER — Ambulatory Visit (INDEPENDENT_AMBULATORY_CARE_PROVIDER_SITE_OTHER): Payer: BC Managed Care – PPO | Admitting: *Deleted

## 2012-01-16 ENCOUNTER — Encounter: Payer: Self-pay | Admitting: Internal Medicine

## 2012-01-16 DIAGNOSIS — I2510 Atherosclerotic heart disease of native coronary artery without angina pectoris: Secondary | ICD-10-CM

## 2012-01-16 DIAGNOSIS — I509 Heart failure, unspecified: Secondary | ICD-10-CM

## 2012-01-16 DIAGNOSIS — Z95810 Presence of automatic (implantable) cardiac defibrillator: Secondary | ICD-10-CM

## 2012-01-16 LAB — ICD DEVICE OBSERVATION
CHARGE TIME: 8.658 s
DEV-0020ICD: NEGATIVE
RV LEAD AMPLITUDE: 15.1 mv
RV LEAD IMPEDENCE ICD: 437 Ohm
RV LEAD THRESHOLD: 0.5 V
TOT-0001: 0
TOT-0002: 0
TZAT-0001FASTVT: 1
TZAT-0004SLOWVT: 8
TZAT-0005SLOWVT: 88 pct
TZAT-0011SLOWVT: 10 ms
TZAT-0012SLOWVT: 170 ms
TZAT-0013SLOWVT: 3
TZAT-0018FASTVT: NEGATIVE
TZAT-0020FASTVT: 1.5 ms
TZAT-0020SLOWVT: 1.5 ms
TZON-0003SLOWVT: 300 ms
TZON-0003VSLOWVT: 370 ms
TZON-0004SLOWVT: 32
TZST-0001FASTVT: 2
TZST-0001FASTVT: 4
TZST-0001FASTVT: 6
TZST-0001SLOWVT: 3
TZST-0001SLOWVT: 4
TZST-0001SLOWVT: 5
TZST-0002FASTVT: NEGATIVE
TZST-0002FASTVT: NEGATIVE
TZST-0003SLOWVT: 35 J
TZST-0003SLOWVT: 35 J
TZST-0003SLOWVT: 35 J
VENTRICULAR PACING ICD: 0.01 pct

## 2012-01-16 NOTE — Progress Notes (Signed)
Wound check defib in clinic  

## 2012-03-10 ENCOUNTER — Telehealth: Payer: Self-pay | Admitting: Family Medicine

## 2012-03-10 MED ORDER — FUROSEMIDE 40 MG PO TABS: 80.0000 mg | ORAL_TABLET | Freq: Two times a day (BID) | ORAL | Status: DC

## 2012-03-10 NOTE — Telephone Encounter (Signed)
I sent script e-scribe, tried to call pt but line was busy.

## 2012-03-10 NOTE — Telephone Encounter (Signed)
Call in Lasix 40 mg to take two pills bid, #120 with 11 rf

## 2012-03-10 NOTE — Telephone Encounter (Signed)
Pt called and said that he keeps running out of furosemide (LASIX) 40 MG tablet. Pt says that he is taking two 40 mg pills in a.m. And 2 at night, totaling 160mg . Pt needs Dr Sarajane Jews to rewrite the script so pills will last 30 days. Right now pills are lasting 15 days. [facility] on [address].

## 2012-04-15 ENCOUNTER — Encounter: Payer: Self-pay | Admitting: Internal Medicine

## 2012-04-15 ENCOUNTER — Ambulatory Visit (INDEPENDENT_AMBULATORY_CARE_PROVIDER_SITE_OTHER): Payer: BC Managed Care – PPO | Admitting: Internal Medicine

## 2012-04-15 VITALS — BP 126/80 | HR 84 | Ht 70.5 in | Wt 211.0 lb

## 2012-04-15 DIAGNOSIS — E785 Hyperlipidemia, unspecified: Secondary | ICD-10-CM

## 2012-04-15 DIAGNOSIS — I428 Other cardiomyopathies: Secondary | ICD-10-CM

## 2012-04-15 DIAGNOSIS — I509 Heart failure, unspecified: Secondary | ICD-10-CM

## 2012-04-15 DIAGNOSIS — I5022 Chronic systolic (congestive) heart failure: Secondary | ICD-10-CM

## 2012-04-15 DIAGNOSIS — Z95810 Presence of automatic (implantable) cardiac defibrillator: Secondary | ICD-10-CM

## 2012-04-15 DIAGNOSIS — I2510 Atherosclerotic heart disease of native coronary artery without angina pectoris: Secondary | ICD-10-CM

## 2012-04-15 LAB — ICD DEVICE OBSERVATION
BATTERY VOLTAGE: 3.21 V
DEV-0020ICD: NEGATIVE
PACEART VT: 2
RV LEAD AMPLITUDE: 15.6 mv
RV LEAD THRESHOLD: 0.625 V
TZAT-0004SLOWVT: 8
TZAT-0005SLOWVT: 88 pct
TZAT-0011SLOWVT: 10 ms
TZAT-0012FASTVT: 170 ms
TZAT-0012SLOWVT: 170 ms
TZAT-0018FASTVT: NEGATIVE
TZAT-0020FASTVT: 1.5 ms
TZAT-0020SLOWVT: 1.5 ms
TZON-0003SLOWVT: 300 ms
TZON-0004SLOWVT: 32
TZST-0001FASTVT: 2
TZST-0001FASTVT: 6
TZST-0001SLOWVT: 3
TZST-0001SLOWVT: 4
TZST-0001SLOWVT: 5
TZST-0002FASTVT: NEGATIVE
TZST-0002FASTVT: NEGATIVE
TZST-0002FASTVT: NEGATIVE
TZST-0003SLOWVT: 35 J
TZST-0003SLOWVT: 35 J

## 2012-04-15 MED ORDER — ATORVASTATIN CALCIUM 80 MG PO TABS: 80.0000 mg | ORAL_TABLET | Freq: Every day | ORAL | Status: DC

## 2012-04-15 MED ORDER — SPIRONOLACTONE 25 MG PO TABS: ORAL_TABLET | ORAL | Status: DC

## 2012-04-15 NOTE — Assessment & Plan Note (Signed)
His last LDL-direct was greater than 200. He is currently on  atorvastatin 40. We'll increase this to 80 and have his lipid profile rechecked when he comes in 2 months to see Marcus Johnson

## 2012-04-15 NOTE — Assessment & Plan Note (Signed)
The patient's device was interrogated.  The information was reviewed. No changes were made in the programming.    

## 2012-04-15 NOTE — Assessment & Plan Note (Signed)
Relatively well compensated. Currently class II. We will begin him on Aldactone 12.5 mg. Potassium in June was 4.2. We'll repeat his metabolic profile in one week 4 weeks and 8 weeks.

## 2012-04-15 NOTE — Assessment & Plan Note (Signed)
Continue current medications. 

## 2012-04-15 NOTE — Patient Instructions (Signed)
Your physician recommends that you schedule a follow-up appointment in: 2 months with Richardson Dopp, PA for Dr. Caryl Comes- will need FASTING labs (bmp/ lipid) same day & 3 months with Kristin/ Nevin Bloodgood for a device check.  Your physician wants you to follow-up in: June 2014 with Dr. Caryl Comes. You will receive a reminder letter in the mail two months in advance. If you don't receive a letter, please call our office to schedule the follow-up appointment.  Your physician has recommended you make the following change in your medication:  1) Start aldactone (spironolactone) 25 mg 1/2 tablet by mouth daily. 2) Increase lipitor (atorvastatin) to 80 mg once daily.  Your physician recommends that you return for lab work in: 1 week (bmp) & 4 weeks (bmp).
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
## 2012-04-22 ENCOUNTER — Other Ambulatory Visit (INDEPENDENT_AMBULATORY_CARE_PROVIDER_SITE_OTHER): Payer: BC Managed Care – PPO

## 2012-04-22 DIAGNOSIS — I428 Other cardiomyopathies: Secondary | ICD-10-CM

## 2012-04-22 DIAGNOSIS — I5022 Chronic systolic (congestive) heart failure: Secondary | ICD-10-CM

## 2012-04-22 DIAGNOSIS — I509 Heart failure, unspecified: Secondary | ICD-10-CM

## 2012-04-22 LAB — BASIC METABOLIC PANEL
CO2: 28 mEq/L (ref 19–32)
Calcium: 8.8 mg/dL (ref 8.4–10.5)
Creatinine, Ser: 1.2 mg/dL (ref 0.4–1.5)
Glucose, Bld: 124 mg/dL — ABNORMAL HIGH (ref 70–99)

## 2012-05-13 ENCOUNTER — Other Ambulatory Visit (INDEPENDENT_AMBULATORY_CARE_PROVIDER_SITE_OTHER): Payer: BC Managed Care – PPO

## 2012-05-13 DIAGNOSIS — I509 Heart failure, unspecified: Secondary | ICD-10-CM

## 2012-05-13 DIAGNOSIS — I5022 Chronic systolic (congestive) heart failure: Secondary | ICD-10-CM

## 2012-05-13 DIAGNOSIS — I428 Other cardiomyopathies: Secondary | ICD-10-CM

## 2012-05-13 LAB — BASIC METABOLIC PANEL
BUN: 20 mg/dL (ref 6–23)
Creatinine, Ser: 0.9 mg/dL (ref 0.4–1.5)
GFR: 109.95 mL/min (ref >60.00)
Potassium: 4.1 mEq/L (ref 3.5–5.1)

## 2012-06-06 ENCOUNTER — Other Ambulatory Visit: Payer: Self-pay | Admitting: *Deleted

## 2012-06-06 DIAGNOSIS — E785 Hyperlipidemia, unspecified: Secondary | ICD-10-CM

## 2012-06-06 DIAGNOSIS — I252 Old myocardial infarction: Secondary | ICD-10-CM

## 2012-06-06 DIAGNOSIS — I10 Essential (primary) hypertension: Secondary | ICD-10-CM

## 2012-06-16 ENCOUNTER — Other Ambulatory Visit: Payer: BC Managed Care – PPO

## 2012-06-17 ENCOUNTER — Encounter: Payer: Self-pay | Admitting: Physician Assistant

## 2012-06-17 ENCOUNTER — Other Ambulatory Visit (INDEPENDENT_AMBULATORY_CARE_PROVIDER_SITE_OTHER): Payer: BC Managed Care – PPO

## 2012-06-17 ENCOUNTER — Ambulatory Visit (INDEPENDENT_AMBULATORY_CARE_PROVIDER_SITE_OTHER): Payer: BC Managed Care – PPO | Admitting: Physician Assistant

## 2012-06-17 VITALS — BP 140/86 | HR 78 | Ht 70.0 in | Wt 212.4 lb

## 2012-06-17 DIAGNOSIS — E785 Hyperlipidemia, unspecified: Secondary | ICD-10-CM

## 2012-06-17 DIAGNOSIS — I2510 Atherosclerotic heart disease of native coronary artery without angina pectoris: Secondary | ICD-10-CM

## 2012-06-17 DIAGNOSIS — I10 Essential (primary) hypertension: Secondary | ICD-10-CM

## 2012-06-17 DIAGNOSIS — I252 Old myocardial infarction: Secondary | ICD-10-CM

## 2012-06-17 DIAGNOSIS — I5022 Chronic systolic (congestive) heart failure: Secondary | ICD-10-CM

## 2012-06-17 LAB — BASIC METABOLIC PANEL WITH GFR
BUN: 31 mg/dL — ABNORMAL HIGH (ref 6–23)
CO2: 27 meq/L (ref 19–32)
Calcium: 9.3 mg/dL (ref 8.4–10.5)
Chloride: 103 meq/L (ref 96–112)
Creatinine, Ser: 1 mg/dL (ref 0.4–1.5)
GFR: 99.92 mL/min
Glucose, Bld: 85 mg/dL (ref 70–99)
Potassium: 4 meq/L (ref 3.5–5.1)
Sodium: 141 meq/L (ref 135–145)

## 2012-06-17 LAB — HEPATIC FUNCTION PANEL
ALT: 26 U/L (ref 0–53)
AST: 31 U/L (ref 0–37)
Albumin: 4 g/dL (ref 3.5–5.2)
Alkaline Phosphatase: 89 U/L (ref 39–117)
Bilirubin, Direct: 0.1 mg/dL (ref 0.0–0.3)
Total Bilirubin: 1 mg/dL (ref 0.3–1.2)
Total Protein: 7.7 g/dL (ref 6.0–8.3)

## 2012-06-17 LAB — LIPID PANEL: Cholesterol: 155 mg/dL (ref 0–200)

## 2012-06-17 NOTE — Progress Notes (Signed)
[address] [place], [place]  [address] Phone: [phone], Fax:  [phone]  Date:  06/17/2012   Name:  Marcus Johnson   DOB:  03-01-1961   MRN:  [id number]  PCP:  [name], MD  Primary Cardiologist/Primary Electrophysiologist:  Dr. Virl Axe    History of Present Illness: Marcus Johnson is a 51 y.o. male who returns for follow up on CHF.  He has a hx of CAD, s/p CABG in [id number], systolic CHF secondary to ischemic cardiomyopathy, DM2, HL. Patient was admitted in 5/13 with syncope. Echo 5/13: Mild LVE, mild LVH, EF 10%, anteroseptal, lateral, apical AK, mild MR, mild LAE, moderately reduced RVSF, mild RAE, PASP 60. LHC 5/13:  dLM 80%, LAD subtotally occluded, pCFX occluded, pRCA 50%, mid? Occlusion with high take off of the PDA with 70% multiple lesions-not bypassed and supplies collaterals to LAD, LIMA-IM/ramus ok, S-OM ok, S-PLA branches ok. Medical therapy was recommended. Patient underwent ICD implantation due to his history of syncope in the setting of severe ischemic cardiomyopathy. He was seen in followup by Dr. Caryl Comes 04/15/12. Spironolactone was added to his medical regimen. His atorvastatin was also adjusted. He was brought back today for followup.  He is doing well.  Notes a "pinching" sensation over his ICD at times. No syncope.  No ICD therapies.  No fever.  No swelling over ICD or drainage.  Denies chest pain, dyspnea, orthopnea, PND, edema.  Notes Class II dyspnea.    Labs (9/13):   K 3.7, creatinine 1.2 Labs (10/13): K 4.1, creatinine 0.9  Wt Readings from Last 3 Encounters:  06/17/12 212 lb 6.4 oz (96.344 kg)  04/15/12 211 lb (95.709 kg)  01/08/12 213 lb 13.5 oz (97 kg)     Past Medical History  Diagnosis Date  . Hypertension   . Coronary artery disease     a) s/p CABG 2002 b) LHC 5/13:  dLM 80%, LAD subtotally occluded, pCFX occluded, pRCA 50%, mid? Occlusion with high take off of the PDA with 70% multiple lesions-not bypassed and  supplies collaterals to LAD, LIMA-IM/ramus ok, S-OM ok, S-PLA branches ok. Medical therapy was recommended  . Chronic systolic heart failure     Secondary to ischemic cardiomyopathy with EF 35-40% by cath 2002, last echo 11/2008 - EF 20-25%  . Diabetes mellitus     type 2 requiring insulin   . MRSA (methicillin resistant Staphylococcus aureus)     Status post right foot plantar deep infection with MRSA status post  I&D 10/2008  . Noncompliance   . Peripheral neuropathy   . Ischemic cardiomyopathy     a. Echo 5/13: Mild LVE, mild LVH, EF 10%, anteroseptal, lateral, apical AK, mild MR, mild LAE, moderately reduced RVSF, mild RAE, PASP 60  . Single-chamber  implantable cardiac defibrillator - Medtronic   . Syncope     Current Outpatient Prescriptions  Medication Sig Dispense Refill  . aspirin 325 MG tablet Take 325 mg by mouth daily.        Marland Kitchen atorvastatin (LIPITOR) 80 MG tablet Take 1 tablet (80 mg total) by mouth daily.  30 tablet  6  . carvedilol (COREG) 6.25 MG tablet Take 1 tablet (6.25 mg total) by mouth 2 (two) times daily with a meal.  60 tablet  6  . furosemide (LASIX) 40 MG tablet Take 2 tablets (80 mg total) by mouth 2 (two) times daily.  120 tablet  11  . insulin aspart protamine-insulin aspart (NOVOLOG MIX 70/30  FLEXPEN) (70-30) 100 UNIT/ML injection Inject 20 Units into the skin 2 (two) times daily with a meal.  3 mL  12  . insulin glargine (LANTUS SOLOSTAR) 100 UNIT/ML injection Inject 40 Units into the skin at bedtime.  3 pen  11  . lisinopril (PRINIVIL,ZESTRIL) 5 MG tablet Take 1 tablet (5 mg total) by mouth 2 (two) times daily.  60 tablet  6  . potassium chloride (KLOR-CON) 20 MEQ packet Take 20 mEq by mouth 2 (two) times daily.  60 packet  11  . pregabalin (LYRICA) 75 MG capsule Take 1 capsule (75 mg total) by mouth 2 (two) times daily.  60 capsule  3  . spironolactone (ALDACTONE) 25 MG tablet Take 1/2 tablet by mouth once daily  30 tablet  6    Allergies:   No Known  Allergies  Social History:  The patient  reports that he has never smoked. He has never used smokeless tobacco. He reports that he does not drink alcohol or use illicit drugs.   ROS:  Please see the history of present illness.   All other systems reviewed and negative.   PHYSICAL EXAM: VS:  BP 140/86  Pulse 78  Ht 5\' 10"  (1.778 m)  Wt 212 lb 6.4 oz (96.344 kg)  BMI 30.48 kg/m2 Well nourished, well developed, in no acute distress HEENT: normal Neck: no JVD Cardiac:  normal S1, S2; RRR; no murmur Chest:  ICD site well healed, no erythema or d/c or tenderness Lungs:  clear to auscultation bilaterally, no wheezing, rhonchi or rales Abd: soft, nontender, no hepatomegaly Ext: no edema Skin: warm and dry Neuro:  CNs 2-12 intact, no focal abnormalities noted  EKG:  NSR, HR 78, LAD, T-wave inversions in 1, aVL, no significant change when compared to prior tracings     ASSESSMENT AND PLAN:  1. Chronic Systolic CHF:   He is class II. He has not taken his medications yet today. I will not adjust medications any further. He is due for blood work today. A basic metabolic panel will be obtained. I will see him back in 2 months. If his blood pressure will allow, I will further titrate his medications. He will likely have followup echocardiogram at some point in the next several months.  2. Hypertension:   This is being treated in the context of his CHF.  3. Coronary Artery Disease:   No angina. Continue aspirin and statin.  4. Hyperlipidemia:   Followup lipids and LFTs pending today.  5. Status Post AICD:   Followup with Device Clinic Next Month and Dr. Caryl Comes in 6/14.  6. Diabetes Mellitus:   Managed by primary care.  Danton Sewer, PA-C  8:46 AM 06/17/2012

## 2012-06-17 NOTE — Patient Instructions (Addendum)
Your physician recommends that you schedule a follow-up appointment in: 08/19/12 @ 8:30 AM WITH SCOTT WEAVER, PAC  LABS TODAY  NO CHANGES WERE MADE TODAY

## 2012-06-18 ENCOUNTER — Telehealth: Payer: Self-pay | Admitting: *Deleted

## 2012-06-18 ENCOUNTER — Encounter: Payer: Self-pay | Admitting: *Deleted

## 2012-06-18 ENCOUNTER — Telehealth: Payer: Self-pay | Admitting: Physician Assistant

## 2012-06-18 DIAGNOSIS — E785 Hyperlipidemia, unspecified: Secondary | ICD-10-CM

## 2012-06-18 MED ORDER — POTASSIUM CHLORIDE CRYS ER 20 MEQ PO TBCR: EXTENDED_RELEASE_TABLET | ORAL | Status: DC

## 2012-06-18 MED ORDER — ROSUVASTATIN CALCIUM 40 MG PO TABS: 40.0000 mg | ORAL_TABLET | Freq: Every day | ORAL | Status: DC

## 2012-06-18 MED ORDER — FUROSEMIDE 40 MG PO TABS: ORAL_TABLET | ORAL | Status: DC

## 2012-06-18 MED ORDER — ATORVASTATIN CALCIUM 80 MG PO TABS: 80.0000 mg | ORAL_TABLET | Freq: Every day | ORAL | Status: DC

## 2012-06-18 MED ORDER — POTASSIUM CHLORIDE 20 MEQ PO PACK: 20.0000 meq | PACK | ORAL | Status: DC

## 2012-06-18 NOTE — Telephone Encounter (Signed)
pt states he was not taking his lipitor everyday, says he will try now. pt upset about his lasix and K+ being decreased, says he cannot afford his meds. I advised since we are decreasing he will not be taking as much so I will not send in a refill. Pt said he is out of town and was not writing down the changes I was telling him and then asked if I could email him everything I just told him but I said no however I said I would send out his lab results ltr to him with the instructions that I just gave him. Repeat bmet 06/26/12 pt aware, pt aware to have FLP/LFT 08/15/2012..I advised pt that once he was home if he saw that he needed refills for lasix and K+ to call the office or the pharmacy.

## 2012-06-18 NOTE — Telephone Encounter (Signed)
New problem:   Returning call back to [place].

## 2012-06-18 NOTE — Telephone Encounter (Signed)
Message copied by Michae Kava on Wed Jun 18, 2012  9:18 AM ------      Message from: [place], [place] T      Created: Tue Jun 17, 2012 11:08 PM       LDL better. Goal is < 70.      Stop Lipitor.        Start Crestor 40 mg QD.      Check L/L in 8 weeks.      BUN high.  May be too dry.      Decrease Lasix to 80 mg in A and 40 mg in P.      Decrease K+ to 20 mEq in A and 10 mEq in P      Check BMET in one week.      Richardson Dopp, PA-C  11:08 PM 06/17/2012

## 2012-06-18 NOTE — Telephone Encounter (Signed)
lmptcb to go over lab results and med changes., med changes made today in the pt's chart and lab orders placed as well

## 2012-06-26 ENCOUNTER — Other Ambulatory Visit (INDEPENDENT_AMBULATORY_CARE_PROVIDER_SITE_OTHER): Payer: BC Managed Care – PPO

## 2012-06-26 DIAGNOSIS — E785 Hyperlipidemia, unspecified: Secondary | ICD-10-CM

## 2012-06-26 LAB — BASIC METABOLIC PANEL
Calcium: 9.4 mg/dL (ref 8.4–10.5)
GFR: 107.23 mL/min (ref >60.00)
Potassium: 4 mEq/L (ref 3.5–5.1)
Sodium: 140 mEq/L (ref 135–145)

## 2012-06-27 ENCOUNTER — Other Ambulatory Visit: Payer: BC Managed Care – PPO

## 2012-06-27 ENCOUNTER — Telehealth: Payer: Self-pay | Admitting: *Deleted

## 2012-06-27 ENCOUNTER — Encounter: Payer: Self-pay | Admitting: *Deleted

## 2012-06-27 NOTE — Telephone Encounter (Signed)
Message copied by Michae Kava on Fri Jun 27, 2012  9:24 AM ------      Message from: [place], [place] T      Created: Thu Jun 26, 2012  5:33 PM       Potassium and kidney function look good.      Continue with current treatment plan.      Richardson Dopp, PA-C  4:49 PM 06/19/2012

## 2012-06-27 NOTE — Telephone Encounter (Signed)
pt notified about lab results w/verbal understanding. pt states he just got my letter that I sent out about previous lab results and med changes w/lasix/K+. pt asked what shoudl he be doing right now for these meds. I said let me d/w PA and I w/cb later. Pt states he is still doing lasix 40 mg bid and K+ 20 daily.  In the result letter PA was increasing lasix to 80 A/40 P and K+ 20 A/10 P.

## 2012-06-30 ENCOUNTER — Telehealth: Payer: Self-pay | Admitting: *Deleted

## 2012-06-30 DIAGNOSIS — I10 Essential (primary) hypertension: Secondary | ICD-10-CM

## 2012-06-30 NOTE — Telephone Encounter (Signed)
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

## 2012-06-30 NOTE — Telephone Encounter (Signed)
Message copied by Michae Kava on Mon Jun 30, 2012 11:17 AM ------      Message from: [place], [place] T      Created: Mon Jun 30, 2012  8:29 AM       That sounds good.  Repeat BMET in 2 weeks.      Richardson Dopp, PA-C  8:29 AM 06/30/2012

## 2012-06-30 NOTE — Telephone Encounter (Signed)
Message copied by Michae Kava on Mon Jun 30, 2012  9:39 AM ------      Message from: [place], [place] T      Created: Mon Jun 30, 2012  8:29 AM       That sounds good.  Repeat BMET in 2 weeks.      Richardson Dopp, PA-C  8:29 AM 06/30/2012

## 2012-07-16 ENCOUNTER — Other Ambulatory Visit (INDEPENDENT_AMBULATORY_CARE_PROVIDER_SITE_OTHER): Payer: BC Managed Care – PPO

## 2012-07-16 ENCOUNTER — Telehealth: Payer: Self-pay | Admitting: *Deleted

## 2012-07-16 ENCOUNTER — Ambulatory Visit (INDEPENDENT_AMBULATORY_CARE_PROVIDER_SITE_OTHER): Payer: BC Managed Care – PPO | Admitting: *Deleted

## 2012-07-16 ENCOUNTER — Other Ambulatory Visit: Payer: BC Managed Care – PPO

## 2012-07-16 DIAGNOSIS — I5022 Chronic systolic (congestive) heart failure: Secondary | ICD-10-CM

## 2012-07-16 DIAGNOSIS — I428 Other cardiomyopathies: Secondary | ICD-10-CM

## 2012-07-16 DIAGNOSIS — I10 Essential (primary) hypertension: Secondary | ICD-10-CM

## 2012-07-16 LAB — ICD DEVICE OBSERVATION
BRDY-0002RV: 40 {beats}/min
RV LEAD AMPLITUDE: 20 mv
RV LEAD IMPEDENCE ICD: 437 Ohm
TZAT-0001SLOWVT: 1
TZAT-0002FASTVT: NEGATIVE
TZAT-0011SLOWVT: 10 ms
TZAT-0012FASTVT: 170 ms
TZAT-0018SLOWVT: NEGATIVE
TZAT-0019SLOWVT: 8 V
TZAT-0020FASTVT: 1.5 ms
TZAT-0020SLOWVT: 1.5 ms
TZON-0004VSLOWVT: 36
TZON-0005SLOWVT: 12
TZST-0001FASTVT: 3
TZST-0001FASTVT: 4
TZST-0001FASTVT: 5
TZST-0001SLOWVT: 2
TZST-0001SLOWVT: 3
TZST-0001SLOWVT: 4
TZST-0001SLOWVT: 6
TZST-0002FASTVT: NEGATIVE
TZST-0002FASTVT: NEGATIVE
TZST-0002FASTVT: NEGATIVE
TZST-0003SLOWVT: 35 J
TZST-0003SLOWVT: 35 J
VENTRICULAR PACING ICD: 0 pct

## 2012-07-16 LAB — BASIC METABOLIC PANEL
CO2: 28 mEq/L (ref 19–32)
Chloride: 101 mEq/L (ref 96–112)
Creatinine, Ser: 1.2 mg/dL (ref 0.4–1.5)
Sodium: 138 mEq/L (ref 135–145)

## 2012-07-16 NOTE — Progress Notes (Signed)
ICD check with ICM 

## 2012-07-16 NOTE — Telephone Encounter (Signed)
Message copied by Michae Kava on Wed Jul 16, 2012  6:00 PM ------      Message from: [place], [place] T      Created: Wed Jul 16, 2012  2:06 PM       Potassium and kidney function look good.      Continue with current treatment plan.      Richardson Dopp, PA-C  4:49 PM 06/19/2012

## 2012-07-16 NOTE — Patient Instructions (Addendum)
Return office visit 10/16/12 @ 9:00am with the device clinic.

## 2012-07-16 NOTE — Telephone Encounter (Signed)
pt notified about lab results today, verbalized understanding

## 2012-07-25 ENCOUNTER — Other Ambulatory Visit: Payer: Self-pay | Admitting: Family Medicine

## 2012-07-25 MED ORDER — PREGABALIN 75 MG PO CAPS: 75.0000 mg | ORAL_CAPSULE | Freq: Two times a day (BID) | ORAL | Status: DC

## 2012-07-25 NOTE — Telephone Encounter (Signed)
I called in script and left a voice message for pt.

## 2012-07-25 NOTE — Telephone Encounter (Signed)
[facility] Triage Call Report Triage Record Num: [id number] Operator: Truddie Crumble Patient Name: Marcus Johnson Call Date & Time: 07/24/2012 4:16:05PM Patient Phone: [phone] PCP: Ishmael Holter. Sarajane Jews Patient Gender: Male PCP Fax : [phone] Patient DOB: 06-Nov-1960 Practice Name: [facility] Reason for Call: Caller: Caryl Asp; PCP: Alysia Penna [facility]); CB#: [phone]; Joy had called from [employer] for refill of Lyrica. Rn finds Lyrica in patient's chart but does not recognise the prescriber, Dr Juventino Slovak. Asked if Dr Sarajane Jews had prescribed this medication for the patient before and she confirms the only name is Kindl. Rn advised not able to help unless one of our providers ordered it. Protocol(s) Used: Office Note Recommended Outcome per Protocol: Information Noted and Sent to Office Reason for Outcome: Caller information to office Care Advice: ~ 12/

## 2012-07-25 NOTE — Telephone Encounter (Signed)
This was for diabetic neuropathy. Okay to refill Lyrica 75 mg bid. Call in #60 with 11 rf

## 2012-08-14 ENCOUNTER — Encounter: Payer: Self-pay | Admitting: Internal Medicine

## 2012-08-15 ENCOUNTER — Other Ambulatory Visit: Payer: BC Managed Care – PPO

## 2012-08-19 ENCOUNTER — Ambulatory Visit (INDEPENDENT_AMBULATORY_CARE_PROVIDER_SITE_OTHER): Payer: BC Managed Care – PPO | Admitting: Physician Assistant

## 2012-08-19 ENCOUNTER — Telehealth: Payer: Self-pay | Admitting: *Deleted

## 2012-08-19 ENCOUNTER — Encounter: Payer: Self-pay | Admitting: Physician Assistant

## 2012-08-19 ENCOUNTER — Other Ambulatory Visit (INDEPENDENT_AMBULATORY_CARE_PROVIDER_SITE_OTHER): Payer: BC Managed Care – PPO

## 2012-08-19 VITALS — BP 132/78 | HR 82 | Ht 70.0 in | Wt 214.0 lb

## 2012-08-19 DIAGNOSIS — I10 Essential (primary) hypertension: Secondary | ICD-10-CM

## 2012-08-19 DIAGNOSIS — R0989 Other specified symptoms and signs involving the circulatory and respiratory systems: Secondary | ICD-10-CM

## 2012-08-19 DIAGNOSIS — I2510 Atherosclerotic heart disease of native coronary artery without angina pectoris: Secondary | ICD-10-CM

## 2012-08-19 DIAGNOSIS — E785 Hyperlipidemia, unspecified: Secondary | ICD-10-CM

## 2012-08-19 DIAGNOSIS — I5022 Chronic systolic (congestive) heart failure: Secondary | ICD-10-CM

## 2012-08-19 LAB — BASIC METABOLIC PANEL
GFR: 82.64 mL/min (ref >60.00)
Potassium: 4.2 mEq/L (ref 3.5–5.1)
Sodium: 140 mEq/L (ref 135–145)

## 2012-08-19 LAB — LIPID PANEL
Cholesterol: 126 mg/dL (ref 0–200)
HDL: 25.6 mg/dL — ABNORMAL LOW (ref >39.00)
Triglycerides: 95 mg/dL (ref 0.0–149.0)
VLDL: 19 mg/dL (ref 0.0–40.0)

## 2012-08-19 LAB — HEPATIC FUNCTION PANEL
ALT: 24 U/L (ref 0–53)
Total Protein: 8.2 g/dL (ref 6.0–8.3)

## 2012-08-19 MED ORDER — CARVEDILOL 6.25 MG PO TABS: 9.3750 mg | ORAL_TABLET | Freq: Two times a day (BID) | ORAL | Status: DC

## 2012-08-19 NOTE — Telephone Encounter (Signed)
pt notified about lab results with verbal understanding today. pt asked for results to be mailed to him. I will put in the mail tonight

## 2012-08-19 NOTE — Patient Instructions (Addendum)
INCREASE COREG TO 9.375 MG TWICE DAILY (THIS WILL BE 1 AND 1/2 TABS TWICE DAILY)  LABS TODAY; BMET, FASTING LIPID/LIVER PANEL  MAKE SURE YOU TAKE YOUR BLOOD PRESSURE AT LEAST 1 HOUR BEFORE YOUR NEXT APPT WITH SCOTT WEAVER, PAC IN 1 MONTH

## 2012-08-19 NOTE — Telephone Encounter (Signed)
Message copied by Michae Kava on Tue Aug 19, 2012  5:21 PM ------      Message from: [place], [place] T      Created: Tue Aug 19, 2012  4:59 PM       Lipids ok      K+ and creatinine ok      Continue with current treatment plan.      Richardson Dopp, PA-C  4:59 PM 08/19/2012

## 2012-08-19 NOTE — Progress Notes (Signed)
[address], [place], [place]  [address] Phone: [phone], Fax:  [phone]  Date:  08/19/2012   Name:  Marcus Johnson   DOB:  [birth year]   MRN:  [id number]  PCP:  [name], MD  Primary Cardiologist and Electrophysiologist:  Dr. Virl Axe    History of Present Illness: Marcus Johnson is a 52 y.o. male who returns for follow up on CHF.  He has a hx of CAD, s/p CABG in [id number], systolic CHF secondary to ischemic cardiomyopathy, DM2, HL. Patient was admitted in 5/13 with syncope. Echo 5/13: Mild LVE, mild LVH, EF 10%, anteroseptal, lateral, apical AK, mild MR, mild LAE, moderately reduced RVSF, mild RAE, PASP 60. LHC 5/13:  dLM 80%, LAD subtotally occluded, pCFX occluded, pRCA 50%, mid? Occlusion with high take off of the PDA with 70% multiple lesions-not bypassed and supplies collaterals to LAD, LIMA-IM/ramus ok, S-OM ok, S-PLA branches ok. Medical therapy was recommended. Patient underwent ICD implantation due to his history of syncope in the setting of severe ischemic cardiomyopathy.   I saw him in followup 06/17/12. No further medication adjustments were made. He was brought back today for further medication titration, if possible.  Overall, doing well.  Held medications today for blood work.  The patient denies chest pain, shortness of breath, syncope, orthopnea, PND or significant pedal edema.  He is NYHA Class II.  Labs (9/13):    K 3.7, creatinine 1.2 Labs (10/13):  K 4.1, creatinine 0.9 Labs (11/13):  K 4, creatinine 1.0, ALT 26, LDL 105 Labs (12/13):  K 4.1, creatinine 1.2  Wt Readings from Last 3 Encounters:  08/19/12 214 lb (97.07 kg)  06/17/12 212 lb 6.4 oz (96.344 kg)  04/15/12 211 lb (95.709 kg)     Past Medical History  Diagnosis Date  . Hypertension   . Coronary artery disease     a) s/p CABG 2002 b) LHC 5/13:  dLM 80%, LAD subtotally occluded, pCFX occluded, pRCA 50%, mid? Occlusion with high take off of the PDA with 70% multiple  lesions-not bypassed and supplies collaterals to LAD, LIMA-IM/ramus ok, S-OM ok, S-PLA branches ok. Medical therapy was recommended  . Chronic systolic heart failure     Secondary to ischemic cardiomyopathy with EF 35-40% by cath 2002, last echo 11/2008 - EF 20-25%  . Diabetes mellitus     type 2 requiring insulin   . MRSA (methicillin resistant Staphylococcus aureus)     Status post right foot plantar deep infection with MRSA status post  I&D 10/2008  . Noncompliance   . Peripheral neuropathy   . Ischemic cardiomyopathy     a. Echo 5/13: Mild LVE, mild LVH, EF 10%, anteroseptal, lateral, apical AK, mild MR, mild LAE, moderately reduced RVSF, mild RAE, PASP 60  . Single-chamber  implantable cardiac defibrillator - Medtronic   . Syncope     Current Outpatient Prescriptions  Medication Sig Dispense Refill  . aspirin 325 MG tablet Take 325 mg by mouth daily.        Marland Kitchen atorvastatin (LIPITOR) 80 MG tablet Take 1 tablet (80 mg total) by mouth daily.      . carvedilol (COREG) 6.25 MG tablet Take 1 tablet (6.25 mg total) by mouth 2 (two) times daily with a meal.  60 tablet  6  . furosemide (LASIX) 40 MG tablet Take 80 mg by mouth 2 (two) times daily. TAKE 80 MG IN THE AM AND 40 MG IN THE PM      .  insulin aspart protamine-insulin aspart (NOVOLOG MIX 70/30 FLEXPEN) (70-30) 100 UNIT/ML injection Inject 20 Units into the skin 2 (two) times daily with a meal.  3 mL  12  . insulin glargine (LANTUS SOLOSTAR) 100 UNIT/ML injection Inject 40 Units into the skin at bedtime.  3 pen  11  . lisinopril (PRINIVIL,ZESTRIL) 5 MG tablet Take 1 tablet (5 mg total) by mouth 2 (two) times daily.  60 tablet  6  . potassium chloride (KLOR-CON) 20 MEQ packet Take 20 mEq by mouth daily.      . pregabalin (LYRICA) 75 MG capsule Take 1 capsule (75 mg total) by mouth 2 (two) times daily.  60 capsule  11  . spironolactone (ALDACTONE) 25 MG tablet Take 1/2 tablet by mouth once daily  30 tablet  6    Allergies:   No Known  Allergies  Social History:  The patient  reports that he has never smoked. He has never used smokeless tobacco. He reports that he does not drink alcohol or use illicit drugs.   ROS:  Please see the history of present illness.     All other systems reviewed and negative.   PHYSICAL EXAM: VS:  BP 132/78  Pulse 82  Ht 5\' 10"  (1.778 m)  Wt 214 lb (97.07 kg)  BMI 30.71 kg/m2 Well nourished, well developed, in no acute distress HEENT: normal Neck: no JVD Cardiac:  normal S1, S2; RRR; no murmur Lungs:  clear to auscultation bilaterally, no wheezing, rhonchi or rales Abd: soft, nontender, no hepatomegaly Ext: no edema Skin: warm and dry Neuro:  CNs 2-12 intact, no focal abnormalities noted  EKG:  NSR, HR 82, LAD, PRWP, TWI 1, aVL, no change from prior tracing    ASSESSMENT AND PLAN:  1. Chronic Systolic CHF:   Stable.  He is Class II.  Will try to titrate medications further.  Increase Coreg to 9.375 mg BID.  He cut back on Lasix on his own to 40 mg BID.  Will check BMET today.  I have advised him to take his medications prior to his next appointment.  2. Hypertension:   This is being treated in the context of his CHF.   3. Coronary Artery Disease:   No angina.  Continue aspirin and statin. 4. Hyperlipidemia:   Patient was instructed to take statin daily at last lab check and follow up is pending today.   5. Status Post AICD:   Followup with Device Clinic and Dr. Caryl Comes as planned.   6. Diabetes Mellitus:   Managed by primary care.    Danton Sewer, PA-C  8:28 AM 08/19/2012

## 2012-09-19 ENCOUNTER — Ambulatory Visit: Payer: BC Managed Care – PPO | Admitting: Physician Assistant

## 2012-10-16 ENCOUNTER — Other Ambulatory Visit: Payer: Self-pay

## 2012-10-16 ENCOUNTER — Encounter: Payer: Self-pay | Admitting: Internal Medicine

## 2012-10-16 ENCOUNTER — Ambulatory Visit (INDEPENDENT_AMBULATORY_CARE_PROVIDER_SITE_OTHER): Payer: BC Managed Care – PPO | Admitting: *Deleted

## 2012-10-16 DIAGNOSIS — I5022 Chronic systolic (congestive) heart failure: Secondary | ICD-10-CM

## 2012-10-16 DIAGNOSIS — I428 Other cardiomyopathies: Secondary | ICD-10-CM

## 2012-10-16 LAB — ICD DEVICE OBSERVATION
BATTERY VOLTAGE: 3.1832 V
CHARGE TIME: 8.278 s
RV LEAD AMPLITUDE: 15.9 mv
RV LEAD IMPEDENCE ICD: 456 Ohm
RV LEAD THRESHOLD: 0.75 V
TOT-0001: 0
TOT-0006: 20130603000000
TZAT-0001FASTVT: 1
TZAT-0011SLOWVT: 10 ms
TZAT-0012FASTVT: 170 ms
TZAT-0012SLOWVT: 170 ms
TZAT-0013SLOWVT: 3
TZAT-0018SLOWVT: NEGATIVE
TZAT-0019SLOWVT: 8 V
TZAT-0020FASTVT: 1.5 ms
TZAT-0020SLOWVT: 1.5 ms
TZON-0003SLOWVT: 300 ms
TZON-0004SLOWVT: 32
TZON-0004VSLOWVT: 36
TZON-0005SLOWVT: 12
TZST-0001FASTVT: 2
TZST-0001FASTVT: 3
TZST-0001FASTVT: 4
TZST-0001SLOWVT: 3
TZST-0001SLOWVT: 6
TZST-0002FASTVT: NEGATIVE
TZST-0002FASTVT: NEGATIVE
TZST-0003SLOWVT: 35 J
TZST-0003SLOWVT: 35 J
TZST-0003SLOWVT: 35 J
VENTRICULAR PACING ICD: 0 pct
VF: 0

## 2012-10-16 NOTE — Progress Notes (Signed)
defib check in clinic  

## 2012-10-17 ENCOUNTER — Other Ambulatory Visit: Payer: Self-pay | Admitting: Family Medicine

## 2012-10-17 NOTE — Telephone Encounter (Signed)
Can we refill this? 

## 2012-10-20 NOTE — Telephone Encounter (Signed)
Refill both for a year

## 2012-11-21 ENCOUNTER — Other Ambulatory Visit: Payer: Self-pay | Admitting: Family Medicine

## 2012-12-18 ENCOUNTER — Other Ambulatory Visit: Payer: Self-pay | Admitting: Internal Medicine

## 2013-01-26 ENCOUNTER — Encounter: Payer: Self-pay | Admitting: Internal Medicine

## 2013-01-26 ENCOUNTER — Ambulatory Visit (INDEPENDENT_AMBULATORY_CARE_PROVIDER_SITE_OTHER): Payer: BC Managed Care – PPO | Admitting: Internal Medicine

## 2013-01-26 VITALS — BP 134/83 | HR 83 | Ht 70.0 in | Wt 210.0 lb

## 2013-01-26 DIAGNOSIS — I2510 Atherosclerotic heart disease of native coronary artery without angina pectoris: Secondary | ICD-10-CM

## 2013-01-26 DIAGNOSIS — I5022 Chronic systolic (congestive) heart failure: Secondary | ICD-10-CM

## 2013-01-26 DIAGNOSIS — Z95810 Presence of automatic (implantable) cardiac defibrillator: Secondary | ICD-10-CM

## 2013-01-26 LAB — ICD DEVICE OBSERVATION
BRDY-0002RV: 40 {beats}/min
FVT: 0
RV LEAD IMPEDENCE ICD: 456 Ohm
RV LEAD THRESHOLD: 0.5 V
TZAT-0001FASTVT: 1
TZAT-0001SLOWVT: 1
TZAT-0002FASTVT: NEGATIVE
TZAT-0004SLOWVT: 8
TZAT-0012FASTVT: 170 ms
TZAT-0013SLOWVT: 3
TZAT-0018FASTVT: NEGATIVE
TZAT-0020SLOWVT: 1.5 ms
TZON-0004SLOWVT: 32
TZST-0001FASTVT: 4
TZST-0001FASTVT: 6
TZST-0001SLOWVT: 2
TZST-0001SLOWVT: 4
TZST-0001SLOWVT: 6
TZST-0002FASTVT: NEGATIVE
TZST-0002FASTVT: NEGATIVE
TZST-0002FASTVT: NEGATIVE
TZST-0003SLOWVT: 35 J
TZST-0003SLOWVT: 35 J
VF: 0

## 2013-01-26 NOTE — Patient Instructions (Addendum)
Your physician recommends that you schedule a follow-up appointment in: 3 months with device clinic.   Your physician wants you to follow-up in: 6 months with Richardson Dopp, PA & 1 year with Dr. Caryl Comes. You will receive a reminder letter in the mail two months in advance. If you don't receive a letter, please call our office to schedule the follow-up appointment.  Your physician has recommended you make the following change in your medication:  1) Decrease aspirin to 81 mg once daily.
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
## 2013-01-26 NOTE — Assessment & Plan Note (Signed)
Continue current medications; we'll decrease aspirin 81 mg

## 2013-01-26 NOTE — Assessment & Plan Note (Signed)
The patient's device was interrogated.  The information was reviewed. No changes were made in the programming.    

## 2013-01-26 NOTE — Assessment & Plan Note (Signed)
Euvolemic. Will continue current guidelines directed therapy

## 2013-03-10 ENCOUNTER — Other Ambulatory Visit: Payer: Self-pay | Admitting: Family Medicine

## 2013-03-11 NOTE — Telephone Encounter (Signed)
Can we refill this? 

## 2013-04-05 ENCOUNTER — Other Ambulatory Visit: Payer: Self-pay | Admitting: Cardiology

## 2013-04-05 ENCOUNTER — Other Ambulatory Visit: Payer: Self-pay | Admitting: Internal Medicine

## 2013-04-07 NOTE — Telephone Encounter (Signed)
Fax Received. Refill Completed. Makaelah Cranfield Chowoe (R.M.A)   

## 2013-04-29 ENCOUNTER — Ambulatory Visit (INDEPENDENT_AMBULATORY_CARE_PROVIDER_SITE_OTHER): Payer: BC Managed Care – PPO | Admitting: *Deleted

## 2013-04-29 DIAGNOSIS — I255 Ischemic cardiomyopathy: Secondary | ICD-10-CM

## 2013-04-29 DIAGNOSIS — I2589 Other forms of chronic ischemic heart disease: Secondary | ICD-10-CM

## 2013-04-29 DIAGNOSIS — I5022 Chronic systolic (congestive) heart failure: Secondary | ICD-10-CM

## 2013-04-29 LAB — ICD DEVICE OBSERVATION
BATTERY VOLTAGE: 3.1696 V
BRDY-0002RV: 40 {beats}/min
CHARGE TIME: 8.498 s
DEV-0020ICD: NEGATIVE
FVT: 0
PACEART VT: 0
RV LEAD AMPLITUDE: 18.3 mv
RV LEAD IMPEDENCE ICD: 437 Ohm
RV LEAD THRESHOLD: 0.5 V
TOT-0001: 0
TOT-0002: 0
TOT-0006: 20130603000000
TZAT-0001FASTVT: 1
TZAT-0001SLOWVT: 1
TZAT-0002FASTVT: NEGATIVE
TZAT-0004SLOWVT: 8
TZAT-0005SLOWVT: 88 pct
TZAT-0011SLOWVT: 10 ms
TZAT-0012FASTVT: 170 ms
TZAT-0012SLOWVT: 170 ms
TZAT-0013SLOWVT: 3
TZAT-0018FASTVT: NEGATIVE
TZAT-0018SLOWVT: NEGATIVE
TZAT-0019FASTVT: 8 V
TZAT-0019SLOWVT: 8 V
TZAT-0020FASTVT: 1.5 ms
TZAT-0020SLOWVT: 1.5 ms
TZON-0003SLOWVT: 300 ms
TZON-0003VSLOWVT: 370 ms
TZON-0004SLOWVT: 32
TZON-0004VSLOWVT: 36
TZON-0005SLOWVT: 12
TZST-0001FASTVT: 2
TZST-0001FASTVT: 3
TZST-0001FASTVT: 4
TZST-0001FASTVT: 5
TZST-0001FASTVT: 6
TZST-0001SLOWVT: 2
TZST-0001SLOWVT: 3
TZST-0001SLOWVT: 4
TZST-0001SLOWVT: 5
TZST-0001SLOWVT: 6
TZST-0002FASTVT: NEGATIVE
TZST-0002FASTVT: NEGATIVE
TZST-0002FASTVT: NEGATIVE
TZST-0002FASTVT: NEGATIVE
TZST-0002FASTVT: NEGATIVE
TZST-0003SLOWVT: 35 J
TZST-0003SLOWVT: 35 J
TZST-0003SLOWVT: 35 J
TZST-0003SLOWVT: 35 J
TZST-0003SLOWVT: 35 J
VENTRICULAR PACING ICD: 0 pct
VF: 0

## 2013-04-29 NOTE — Progress Notes (Signed)
ICD check in clinic. Normal device function. Thresholds and sensing consistent with previous device measurements. Impedance trends stable over time. No evidence of any ventricular arrhythmias. Histogram distribution appropriate for patient and level of activity. No changes made this session. Device programmed at appropriate safety margins. Device programmed to optimize intrinsic conduction. Pt enrolled in remote follow-up. Number was given for pt to call [facility] to request cell adapter. Will revisit at next device check. ROV in 3 mths w/device clinic and June with [facility].

## 2013-05-08 ENCOUNTER — Encounter: Payer: Self-pay | Admitting: Internal Medicine

## 2013-08-03 ENCOUNTER — Ambulatory Visit (INDEPENDENT_AMBULATORY_CARE_PROVIDER_SITE_OTHER): Payer: BC Managed Care – PPO | Admitting: *Deleted

## 2013-08-03 DIAGNOSIS — I2589 Other forms of chronic ischemic heart disease: Secondary | ICD-10-CM

## 2013-08-03 DIAGNOSIS — I255 Ischemic cardiomyopathy: Secondary | ICD-10-CM

## 2013-08-03 DIAGNOSIS — I5022 Chronic systolic (congestive) heart failure: Secondary | ICD-10-CM

## 2013-08-03 LAB — MDC_IDC_ENUM_SESS_TYPE_INCLINIC
HighPow Impedance: 38 Ohm
HighPow Impedance: 45 Ohm
Lead Channel Pacing Threshold Amplitude: 0.5 V
Lead Channel Sensing Intrinsic Amplitude: 13.9 mV
Lead Channel Setting Pacing Pulse Width: 0.4 ms
Lead Channel Setting Sensing Sensitivity: 0.3 mV
Zone Setting Detection Interval: 250 ms

## 2013-08-03 NOTE — Progress Notes (Signed)
ICD check in clinic. Normal device function. Threshold and sensing consistent with previous device measurements. Impedance trends stable over time. SIC=0. No evidence of any ventricular arrhythmias. Histogram distribution appropriate for patient and level of activity. No changes made this session. Device programmed at appropriate safety margins. Device programmed to optimize intrinsic conduction. Current batt voltage 3.15V (RRT 2.63V).  Plan to follow up with the device clinic on 11-02-2013 and with [facility] in June 2015. Patient education completed including shock plan. Alert tones demonstrated for patient.

## 2013-08-21 ENCOUNTER — Encounter: Payer: Self-pay | Admitting: Internal Medicine

## 2013-09-03 ENCOUNTER — Other Ambulatory Visit: Payer: Self-pay | Admitting: Physician Assistant

## 2013-09-17 ENCOUNTER — Encounter: Payer: Self-pay | Admitting: Family Medicine

## 2013-09-17 ENCOUNTER — Ambulatory Visit (INDEPENDENT_AMBULATORY_CARE_PROVIDER_SITE_OTHER): Payer: BC Managed Care – PPO | Admitting: Family Medicine

## 2013-09-17 VITALS — BP 124/70 | HR 86 | Temp 98.5°F | Wt 218.0 lb

## 2013-09-17 DIAGNOSIS — J069 Acute upper respiratory infection, unspecified: Secondary | ICD-10-CM

## 2013-09-17 DIAGNOSIS — B9789 Other viral agents as the cause of diseases classified elsewhere: Principal | ICD-10-CM

## 2013-09-17 MED ORDER — HYDROCODONE-HOMATROPINE 5-1.5 MG/5ML PO SYRP: 5.0000 mL | ORAL_SOLUTION | Freq: Four times a day (QID) | ORAL | Status: AC | PRN
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
## 2013-09-17 NOTE — Progress Notes (Signed)
Pre visit review using our clinic review tool, if applicable. No additional management support is needed unless otherwise documented below in the visit note. 

## 2013-09-17 NOTE — Patient Instructions (Signed)

## 2013-10-19 ENCOUNTER — Encounter (HOSPITAL_COMMUNITY): Payer: Self-pay | Admitting: Emergency Medicine

## 2013-10-19 ENCOUNTER — Inpatient Hospital Stay (HOSPITAL_COMMUNITY)
Admission: EM | Admit: 2013-10-19 | Discharge: 2013-10-26 | DRG: 291 | Disposition: A | Discharge status: Home / Self Care | Payer: BC Managed Care – PPO | Attending: Internal Medicine | Admitting: Internal Medicine

## 2013-10-19 ENCOUNTER — Emergency Department (HOSPITAL_COMMUNITY): Payer: BC Managed Care – PPO

## 2013-10-19 DIAGNOSIS — J40 Bronchitis, not specified as acute or chronic: Secondary | ICD-10-CM | POA: Diagnosis present

## 2013-10-19 DIAGNOSIS — E785 Hyperlipidemia, unspecified: Secondary | ICD-10-CM

## 2013-10-19 DIAGNOSIS — E119 Type 2 diabetes mellitus without complications: Secondary | ICD-10-CM

## 2013-10-19 DIAGNOSIS — L02239 Carbuncle of trunk, unspecified: Secondary | ICD-10-CM

## 2013-10-19 DIAGNOSIS — L02229 Furuncle of trunk, unspecified: Secondary | ICD-10-CM

## 2013-10-19 DIAGNOSIS — G609 Hereditary and idiopathic neuropathy, unspecified: Secondary | ICD-10-CM | POA: Diagnosis present

## 2013-10-19 DIAGNOSIS — R0602 Shortness of breath: Secondary | ICD-10-CM

## 2013-10-19 DIAGNOSIS — I2589 Other forms of chronic ischemic heart disease: Secondary | ICD-10-CM | POA: Diagnosis present

## 2013-10-19 DIAGNOSIS — I10 Essential (primary) hypertension: Secondary | ICD-10-CM | POA: Diagnosis present

## 2013-10-19 DIAGNOSIS — I252 Old myocardial infarction: Secondary | ICD-10-CM

## 2013-10-19 DIAGNOSIS — A4902 Methicillin resistant Staphylococcus aureus infection, unspecified site: Secondary | ICD-10-CM

## 2013-10-19 DIAGNOSIS — I5021 Acute systolic (congestive) heart failure: Secondary | ICD-10-CM | POA: Diagnosis present

## 2013-10-19 DIAGNOSIS — Z79899 Other long term (current) drug therapy: Secondary | ICD-10-CM

## 2013-10-19 DIAGNOSIS — Z95810 Presence of automatic (implantable) cardiac defibrillator: Secondary | ICD-10-CM

## 2013-10-19 DIAGNOSIS — I5022 Chronic systolic (congestive) heart failure: Secondary | ICD-10-CM

## 2013-10-19 DIAGNOSIS — I5023 Acute on chronic systolic (congestive) heart failure: Principal | ICD-10-CM | POA: Diagnosis present

## 2013-10-19 DIAGNOSIS — L03119 Cellulitis of unspecified part of limb: Secondary | ICD-10-CM

## 2013-10-19 DIAGNOSIS — J209 Acute bronchitis, unspecified: Secondary | ICD-10-CM

## 2013-10-19 DIAGNOSIS — J189 Pneumonia, unspecified organism: Secondary | ICD-10-CM | POA: Diagnosis present

## 2013-10-19 DIAGNOSIS — Z951 Presence of aortocoronary bypass graft: Secondary | ICD-10-CM

## 2013-10-19 DIAGNOSIS — R0600 Dyspnea, unspecified: Secondary | ICD-10-CM | POA: Diagnosis present

## 2013-10-19 DIAGNOSIS — Z8614 Personal history of Methicillin resistant Staphylococcus aureus infection: Secondary | ICD-10-CM

## 2013-10-19 DIAGNOSIS — Z794 Long term (current) use of insulin: Secondary | ICD-10-CM

## 2013-10-19 DIAGNOSIS — I509 Heart failure, unspecified: Secondary | ICD-10-CM | POA: Diagnosis present

## 2013-10-19 DIAGNOSIS — L02619 Cutaneous abscess of unspecified foot: Secondary | ICD-10-CM

## 2013-10-19 DIAGNOSIS — I2510 Atherosclerotic heart disease of native coronary artery without angina pectoris: Secondary | ICD-10-CM | POA: Diagnosis present

## 2013-10-19 DIAGNOSIS — D649 Anemia, unspecified: Secondary | ICD-10-CM | POA: Diagnosis present

## 2013-10-19 DIAGNOSIS — I255 Ischemic cardiomyopathy: Secondary | ICD-10-CM

## 2013-10-19 DIAGNOSIS — M722 Plantar fascial fibromatosis: Secondary | ICD-10-CM

## 2013-10-19 DIAGNOSIS — Z7982 Long term (current) use of aspirin: Secondary | ICD-10-CM

## 2013-10-19 LAB — COMPREHENSIVE METABOLIC PANEL
ALK PHOS: 124 U/L — AB (ref 39–117)
ALT: 21 U/L (ref 0–53)
AST: 22 U/L (ref 0–37)
Albumin: 2.9 g/dL — ABNORMAL LOW (ref 3.5–5.2)
BUN: 26 mg/dL — ABNORMAL HIGH (ref 6–23)
CO2: 25 mEq/L (ref 19–32)
Calcium: 8.5 mg/dL (ref 8.4–10.5)
Chloride: 100 mEq/L (ref 96–112)
Creatinine, Ser: 0.98 mg/dL (ref 0.50–1.35)
GFR calc non Af Amer: >90 mL/min (ref >90)
GLUCOSE: 257 mg/dL — AB (ref 70–99)
POTASSIUM: 3.9 meq/L (ref 3.7–5.3)
SODIUM: 139 meq/L (ref 137–147)
TOTAL PROTEIN: 7 g/dL (ref 6.0–8.3)
Total Bilirubin: 1.3 mg/dL — ABNORMAL HIGH (ref 0.3–1.2)

## 2013-10-19 LAB — CBC
HEMATOCRIT: 30.8 % — AB (ref 39.0–52.0)
Hemoglobin: 9.8 g/dL — ABNORMAL LOW (ref 13.0–17.0)
MCH: 26.1 pg (ref 26.0–34.0)
MCHC: 31.8 g/dL (ref 30.0–36.0)
MCV: 81.9 fL (ref 78.0–100.0)
Platelets: 213 10*3/uL (ref 150–400)
RBC: 3.76 MIL/uL — ABNORMAL LOW (ref 4.22–5.81)
RDW: 16.3 % — AB (ref 11.5–15.5)
WBC: 8 10*3/uL (ref 4.0–10.5)

## 2013-10-19 LAB — CBC WITH DIFFERENTIAL/PLATELET
BASOS ABS: 0 10*3/uL (ref 0.0–0.1)
Basophils Relative: 0 % (ref 0–1)
EOS ABS: 0.1 10*3/uL (ref 0.0–0.7)
Eosinophils Relative: 1 % (ref 0–5)
HCT: 30.1 % — ABNORMAL LOW (ref 39.0–52.0)
Hemoglobin: 9.5 g/dL — ABNORMAL LOW (ref 13.0–17.0)
LYMPHS PCT: 27 % (ref 12–46)
Lymphs Abs: 2.1 10*3/uL (ref 0.7–4.0)
MCH: 25.9 pg — ABNORMAL LOW (ref 26.0–34.0)
MCHC: 31.6 g/dL (ref 30.0–36.0)
MCV: 82 fL (ref 78.0–100.0)
Monocytes Absolute: 0.8 10*3/uL (ref 0.1–1.0)
Monocytes Relative: 9 % (ref 3–12)
NEUTROS PCT: 63 % (ref 43–77)
Neutro Abs: 5 10*3/uL (ref 1.7–7.7)
PLATELETS: 219 10*3/uL (ref 150–400)
RBC: 3.67 MIL/uL — ABNORMAL LOW (ref 4.22–5.81)
RDW: 16.1 % — AB (ref 11.5–15.5)
WBC: 8 10*3/uL (ref 4.0–10.5)

## 2013-10-19 LAB — PRO B NATRIURETIC PEPTIDE: PRO B NATRI PEPTIDE: 1605 pg/mL — AB (ref 0–125)

## 2013-10-19 LAB — CREATININE, SERUM
Creatinine, Ser: 0.95 mg/dL (ref 0.50–1.35)
GFR calc non Af Amer: >90 mL/min (ref >90)

## 2013-10-19 LAB — CBG MONITORING, ED: GLUCOSE-CAPILLARY: 250 mg/dL — AB (ref 70–99)

## 2013-10-19 LAB — I-STAT TROPONIN, ED: Troponin i, poc: 0.05 ng/mL (ref 0.00–0.08)

## 2013-10-19 MED ORDER — FUROSEMIDE 10 MG/ML IJ SOLN
40.0000 mg | Freq: Once | INTRAMUSCULAR | Status: AC
  Administered 2013-10-19: 40 mg via INTRAVENOUS
  Filled 2013-10-19: qty 4

## 2013-10-19 MED ORDER — INSULIN GLARGINE 100 UNIT/ML ~~LOC~~ SOLN
35.0000 [IU] | Freq: Every day | SUBCUTANEOUS | Status: DC
  Administered 2013-10-20: 35 [IU] via SUBCUTANEOUS
  Filled 2013-10-19 (×2): qty 0.35

## 2013-10-19 MED ORDER — INSULIN ASPART 100 UNIT/ML ~~LOC~~ SOLN
0.0000 [IU] | Freq: Three times a day (TID) | SUBCUTANEOUS | Status: DC
  Administered 2013-10-20: 3 [IU] via SUBCUTANEOUS
  Administered 2013-10-20: 5 [IU] via SUBCUTANEOUS
  Administered 2013-10-20 – 2013-10-21 (×3): 3 [IU] via SUBCUTANEOUS
  Administered 2013-10-21 – 2013-10-22 (×2): 2 [IU] via SUBCUTANEOUS
  Administered 2013-10-22 (×2): 3 [IU] via SUBCUTANEOUS
  Administered 2013-10-23 – 2013-10-24 (×2): 2 [IU] via SUBCUTANEOUS
  Administered 2013-10-25: 5 [IU] via SUBCUTANEOUS
  Administered 2013-10-25: 2 [IU] via SUBCUTANEOUS

## 2013-10-19 MED ORDER — POTASSIUM CHLORIDE 20 MEQ PO PACK
20.0000 meq | PACK | Freq: Every day | ORAL | Status: DC
  Filled 2013-10-19: qty 1

## 2013-10-19 MED ORDER — INSULIN ASPART 100 UNIT/ML ~~LOC~~ SOLN
0.0000 [IU] | Freq: Every day | SUBCUTANEOUS | Status: DC
  Administered 2013-10-20 (×2): 2 [IU] via SUBCUTANEOUS

## 2013-10-19 MED ORDER — SODIUM CHLORIDE 0.9 % IV SOLN: 250.0000 mL | INTRAVENOUS | Status: DC | PRN

## 2013-10-19 MED ORDER — ASPIRIN EC 81 MG PO TBEC
81.0000 mg | DELAYED_RELEASE_TABLET | Freq: Every day | ORAL | Status: DC
  Administered 2013-10-20 – 2013-10-26 (×7): 81 mg via ORAL
  Filled 2013-10-19 (×7): qty 1

## 2013-10-19 MED ORDER — DEXTROSE 5 % IV SOLN
500.0000 mg | INTRAVENOUS | Status: AC
  Administered 2013-10-20: 500 mg via INTRAVENOUS
  Filled 2013-10-19: qty 500

## 2013-10-19 MED ORDER — SODIUM CHLORIDE 0.9 % IJ SOLN: 3.0000 mL | INTRAMUSCULAR | Status: DC | PRN

## 2013-10-19 MED ORDER — PREGABALIN 25 MG PO CAPS
75.0000 mg | ORAL_CAPSULE | Freq: Every day | ORAL | Status: DC
  Administered 2013-10-20 – 2013-10-24 (×5): 75 mg via ORAL
  Filled 2013-10-19 (×5): qty 3

## 2013-10-19 MED ORDER — DEXTROSE 5 % IV SOLN
1.0000 g | Freq: Once | INTRAVENOUS | Status: AC
  Administered 2013-10-19: 1 g via INTRAVENOUS
  Filled 2013-10-19: qty 10

## 2013-10-19 MED ORDER — FUROSEMIDE 10 MG/ML IJ SOLN
60.0000 mg | Freq: Two times a day (BID) | INTRAMUSCULAR | Status: DC
  Administered 2013-10-20 – 2013-10-21 (×4): 60 mg via INTRAVENOUS
  Filled 2013-10-19 (×4): qty 6

## 2013-10-19 MED ORDER — ATORVASTATIN CALCIUM 80 MG PO TABS
80.0000 mg | ORAL_TABLET | Freq: Every day | ORAL | Status: DC
  Administered 2013-10-20 – 2013-10-25 (×6): 80 mg via ORAL
  Filled 2013-10-19 (×8): qty 1

## 2013-10-19 MED ORDER — SODIUM CHLORIDE 0.9 % IJ SOLN
3.0000 mL | Freq: Two times a day (BID) | INTRAMUSCULAR | Status: DC
  Administered 2013-10-20 (×3): 3 mL via INTRAVENOUS

## 2013-10-19 MED ORDER — SODIUM CHLORIDE 0.9 % IJ SOLN
3.0000 mL | Freq: Two times a day (BID) | INTRAMUSCULAR | Status: DC
  Administered 2013-10-20 (×2): 3 mL via INTRAVENOUS

## 2013-10-19 MED ORDER — LISINOPRIL 5 MG PO TABS
5.0000 mg | ORAL_TABLET | Freq: Two times a day (BID) | ORAL | Status: DC
  Administered 2013-10-20 – 2013-10-26 (×14): 5 mg via ORAL
  Filled 2013-10-19 (×15): qty 1

## 2013-10-19 MED ORDER — CARVEDILOL 6.25 MG PO TABS
6.2500 mg | ORAL_TABLET | Freq: Two times a day (BID) | ORAL | Status: DC
  Administered 2013-10-20 – 2013-10-21 (×3): 6.25 mg via ORAL
  Filled 2013-10-19 (×6): qty 1

## 2013-10-19 MED ORDER — DEXTROSE 5 % IV SOLN
500.0000 mg | Freq: Once | INTRAVENOUS | Status: AC
  Administered 2013-10-19: 500 mg via INTRAVENOUS

## 2013-10-19 MED ORDER — DEXTROSE 5 % IV SOLN
1.0000 g | INTRAVENOUS | Status: AC
  Administered 2013-10-20: 1 g via INTRAVENOUS
  Filled 2013-10-19: qty 10

## 2013-10-19 MED ORDER — HEPARIN SODIUM (PORCINE) 5000 UNIT/ML IJ SOLN
5000.0000 [IU] | Freq: Three times a day (TID) | INTRAMUSCULAR | Status: DC
  Administered 2013-10-20 – 2013-10-26 (×20): 5000 [IU] via SUBCUTANEOUS
  Filled 2013-10-19 (×21): qty 1

## 2013-10-19 MED ORDER — SPIRONOLACTONE 12.5 MG HALF TABLET
12.5000 mg | ORAL_TABLET | Freq: Every day | ORAL | Status: DC
  Administered 2013-10-20 – 2013-10-22 (×3): 12.5 mg via ORAL
  Filled 2013-10-19 (×4): qty 1

## 2013-10-19 MED ORDER — INSULIN GLARGINE 100 UNIT/ML SOLOSTAR PEN: 35.0000 [IU] | PEN_INJECTOR | Freq: Every day | SUBCUTANEOUS | Status: DC

## 2013-10-19 NOTE — ED Notes (Signed)
Dr. Newton at bedside. 

## 2013-10-19 NOTE — ED Notes (Addendum)
Pt reports dizziness x several weeks. Reports he has pacemaker/defib for dizziness. Wanders if pacemaker is not working properly. Weakness for several days. Denies CP at current. Pt is a x 4. Skin is warm and dry.
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
## 2013-10-19 NOTE — ED Notes (Signed)
Chanetta Marshall from [employer] called and states the patients pacemaker report will be ready in 15 minutes.

## 2013-10-19 NOTE — ED Notes (Signed)
Scott from [employer] also reports that since February there has been a rise in the patients fluid index and is trending positive.

## 2013-10-20 ENCOUNTER — Encounter (HOSPITAL_COMMUNITY): Payer: Self-pay | Admitting: *Deleted

## 2013-10-20 DIAGNOSIS — E119 Type 2 diabetes mellitus without complications: Secondary | ICD-10-CM

## 2013-10-20 DIAGNOSIS — E785 Hyperlipidemia, unspecified: Secondary | ICD-10-CM

## 2013-10-20 DIAGNOSIS — I10 Essential (primary) hypertension: Secondary | ICD-10-CM

## 2013-10-20 DIAGNOSIS — I5022 Chronic systolic (congestive) heart failure: Secondary | ICD-10-CM

## 2013-10-20 DIAGNOSIS — J209 Acute bronchitis, unspecified: Secondary | ICD-10-CM

## 2013-10-20 DIAGNOSIS — R0989 Other specified symptoms and signs involving the circulatory and respiratory systems: Secondary | ICD-10-CM

## 2013-10-20 DIAGNOSIS — R0609 Other forms of dyspnea: Secondary | ICD-10-CM

## 2013-10-20 LAB — TROPONIN I
Troponin I: <0.3 ng/mL (ref <0.30)
Troponin I: <0.3 ng/mL (ref <0.30)

## 2013-10-20 LAB — CBC WITH DIFFERENTIAL/PLATELET
Basophils Absolute: 0 10*3/uL (ref 0.0–0.1)
Basophils Relative: 0 % (ref 0–1)
EOS ABS: 0.1 10*3/uL (ref 0.0–0.7)
EOS PCT: 1 % (ref 0–5)
HCT: 30 % — ABNORMAL LOW (ref 39.0–52.0)
Hemoglobin: 9.4 g/dL — ABNORMAL LOW (ref 13.0–17.0)
LYMPHS ABS: 1.9 10*3/uL (ref 0.7–4.0)
Lymphocytes Relative: 26 % (ref 12–46)
MCH: 26 pg (ref 26.0–34.0)
MCHC: 31.3 g/dL (ref 30.0–36.0)
MCV: 82.9 fL (ref 78.0–100.0)
Monocytes Absolute: 0.7 10*3/uL (ref 0.1–1.0)
Monocytes Relative: 10 % (ref 3–12)
Neutro Abs: 4.5 10*3/uL (ref 1.7–7.7)
Neutrophils Relative %: 62 % (ref 43–77)
Platelets: 205 10*3/uL (ref 150–400)
RBC: 3.62 MIL/uL — AB (ref 4.22–5.81)
RDW: 16.5 % — ABNORMAL HIGH (ref 11.5–15.5)
WBC: 7.2 10*3/uL (ref 4.0–10.5)

## 2013-10-20 LAB — COMPREHENSIVE METABOLIC PANEL
ALT: 20 U/L (ref 0–53)
AST: 21 U/L (ref 0–37)
Albumin: 2.9 g/dL — ABNORMAL LOW (ref 3.5–5.2)
Alkaline Phosphatase: 118 U/L — ABNORMAL HIGH (ref 39–117)
BUN: 22 mg/dL (ref 6–23)
CALCIUM: 8.4 mg/dL (ref 8.4–10.5)
CO2: 25 mEq/L (ref 19–32)
CREATININE: 0.95 mg/dL (ref 0.50–1.35)
Chloride: 105 mEq/L (ref 96–112)
GFR calc non Af Amer: >90 mL/min (ref >90)
GLUCOSE: 207 mg/dL — AB (ref 70–99)
Potassium: 3.7 mEq/L (ref 3.7–5.3)
Sodium: 144 mEq/L (ref 137–147)
Total Bilirubin: 1.2 mg/dL (ref 0.3–1.2)
Total Protein: 7.1 g/dL (ref 6.0–8.3)

## 2013-10-20 LAB — GLUCOSE, CAPILLARY
GLUCOSE-CAPILLARY: 225 mg/dL — AB (ref 70–99)
Glucose-Capillary: 176 mg/dL — ABNORMAL HIGH (ref 70–99)
Glucose-Capillary: 195 mg/dL — ABNORMAL HIGH (ref 70–99)
Glucose-Capillary: 204 mg/dL — ABNORMAL HIGH (ref 70–99)
Glucose-Capillary: 218 mg/dL — ABNORMAL HIGH (ref 70–99)

## 2013-10-20 LAB — LEGIONELLA ANTIGEN, URINE: LEGIONELLA ANTIGEN, URINE: NEGATIVE

## 2013-10-20 LAB — STREP PNEUMONIAE URINARY ANTIGEN: Strep Pneumo Urinary Antigen: NEGATIVE

## 2013-10-20 LAB — MRSA PCR SCREENING: MRSA by PCR: NEGATIVE

## 2013-10-20 LAB — RETICULOCYTES
RBC.: 3.65 MIL/uL — AB (ref 4.22–5.81)
Retic Count, Absolute: 102.2 10*3/uL (ref 19.0–186.0)
Retic Ct Pct: 2.8 % (ref 0.4–3.1)

## 2013-10-20 LAB — IRON AND TIBC
Iron: 46 ug/dL (ref 42–135)
Saturation Ratios: 15 % — ABNORMAL LOW (ref 20–55)
TIBC: 302 ug/dL (ref 215–435)
UIBC: 256 ug/dL (ref 125–400)

## 2013-10-20 LAB — HEMOGLOBIN A1C
HEMOGLOBIN A1C: 11 % — AB (ref <5.7)
Mean Plasma Glucose: 269 mg/dL — ABNORMAL HIGH (ref <117)

## 2013-10-20 LAB — VITAMIN B12: VITAMIN B 12: 695 pg/mL (ref 211–911)

## 2013-10-20 LAB — FERRITIN: Ferritin: 177 ng/mL (ref 22–322)

## 2013-10-20 LAB — FOLATE: FOLATE: 13 ng/mL

## 2013-10-20 MED ORDER — MAGNESIUM SULFATE 40 MG/ML IJ SOLN
2.0000 g | Freq: Once | INTRAMUSCULAR | Status: AC
  Administered 2013-10-20: 2 g via INTRAVENOUS
  Filled 2013-10-20: qty 50

## 2013-10-20 MED ORDER — POTASSIUM CHLORIDE CRYS ER 20 MEQ PO TBCR
20.0000 meq | EXTENDED_RELEASE_TABLET | Freq: Every day | ORAL | Status: DC
  Administered 2013-10-20 – 2013-10-26 (×7): 20 meq via ORAL
  Filled 2013-10-20 (×8): qty 1

## 2013-10-20 MED ORDER — FUROSEMIDE 10 MG/ML IJ SOLN
INTRAMUSCULAR | Status: AC
  Administered 2013-10-20: 60 mg via INTRAVENOUS
  Filled 2013-10-20: qty 8

## 2013-10-20 MED ORDER — INFLUENZA VAC SPLIT QUAD 0.5 ML IM SUSP
0.5000 mL | INTRAMUSCULAR | Status: AC
  Administered 2013-10-21: 0.5 mL via INTRAMUSCULAR
  Filled 2013-10-20: qty 0.5

## 2013-10-20 MED ORDER — DOXYCYCLINE HYCLATE 100 MG PO TABS
100.0000 mg | ORAL_TABLET | Freq: Two times a day (BID) | ORAL | Status: DC
  Administered 2013-10-21 – 2013-10-26 (×11): 100 mg via ORAL
  Filled 2013-10-20 (×12): qty 1

## 2013-10-20 NOTE — Progress Notes (Signed)
Patient had a 10 beat run of V-Tach it was non-sustaining. Patient was asymptomatic at the time and up by sink washing up. No verbal complaints and no signs or symptoms of distress or discomfort. BP is 118/72 and HR 89. MD is aware no new orders given at the time. Will continue to monitor patient for further changes in condition.

## 2013-10-20 NOTE — Progress Notes (Signed)
Report given to receiving RN. Patient in bed sleeping. No signs or symptoms of distress or discomfort.

## 2013-10-20 NOTE — Progress Notes (Signed)
UR completed. Patient changed to inpatient- requiring IV antibiotics and IV lasix.

## 2013-10-20 NOTE — Progress Notes (Signed)
[facility] PROGRESS NOTE Interim History: 53 y.o. year old male with prior history of CAD s/p CABG, ischemic cardiomyopathy w/ EF 10% by ECHO 2013 s/p ICD, IDDM presenting with dyspnea. Patient states that he is at upper respiratory infection symptoms including nasal congestion cough over the past 2-3 weeks. Was initially seen by PCP 2 weeks ago and getting some prescription cough medicine. Patient states his symptoms persisted over the course of 2-3 weeks with worsening dyspnea over the last week. Denies any fevers or chills. Dyspnea as predominantly with exertion. Has also noticed some mild lower extremity swelling and weight gain.   Filed Weights   10/19/13 1824 10/19/13 2328 10/20/13 0547  Weight: 102.059 kg (225 lb) 99.338 kg (219 lb) 99.4 kg (219 lb 2.2 oz)        Intake/Output Summary (Last 24 hours) at 10/20/13 1830 Last data filed at 10/20/13 1300  Gross per 24 hour  Intake    720 ml  Output   2025 ml  Net  -1305 ml     Assessment/Plan: 1-acute on chronic systolic heart failure: last EF 10% (s/p ICD). Patient reports recent flu symptoms and was maintaining himself well hydrated; also some none-compliance with low sodium diet. -continue strict I's and O's -continue IV lasix (still with LE edema, some bibasilar fine crackles and mild JVD) -daily weight -low sodium diet -continue lisinopril and b-blockers  2-bronchitis/early PNA: typical bacterial bronchitis/PNa after viral infection. -will treat with doxycycline for good coverage of atypical microorganism -continue supportive care  3-DM: continue SSI and lantus -A1C 11.0;  Will need close follow up as an outpatient -CBG's as inpatient 190-200 (concerns for medication compliance)  4-HTN: continue current antihypertensive regimen. BP stable  5-HLD: continue statins  6-CAD: no CP. Patient troponin neg -continue ASA, b-blockers, ACE and statins  7-DVT: heparin    Code Status: Full Family Communication: no  family at bedside Disposition Plan: home when medically stable   Consultants:  None   Procedures: ECHO: 12/2011 - Left ventricle: The cavity size was mildly dilated. Wall thickness was increased in a pattern of mild LVH. The estimated ejection fraction was 10%. There is akinesis of the anteroseptal, lateral, and apical myocardium. There is akinesis of the mid-distalinferior myocardium. - Mitral valve: Mild regurgitation. - Left atrium: The atrium was mildly dilated. - Right ventricle: Systolic function was moderately reduced. - Right atrium: The atrium was mildly dilated. - Pulmonary arteries: Systolic pressure was moderately increased. PA peak pressure: 32mm Hg (S).   Antibiotics:  Rocephin and zithromax 3/16>>3/17  Doxycycline 3/17  HPI/Subjective: Afebrile, feeling better and breathing better. Denies CP.  Objective: Filed Vitals:   10/20/13 0840 10/20/13 1013 10/20/13 1428 10/20/13 1804  BP: 118/72 127/76 104/56 123/74  Pulse: 89 88 82 86  Temp:   97 F (36.1 C)   TempSrc:   Oral   Resp:   20   Height:      Weight:      SpO2:   99%      Exam:  General: Alert, awake, oriented x3, in no acute distress. Feeling better and breathing better. HEENT: No bruits, no goiter. Mild JVD Heart: Regular rate and rhythm, without rubs or gallops. 1+ edema bilaterally Lungs: Good air movement; fine crackles at bases Abdomen: Soft, nontender, nondistended, positive bowel sounds.  Neuro: Grossly intact, nonfocal.   Data Reviewed: Basic Metabolic Panel:  Recent Labs Lab 10/19/13 1903 10/19/13 2253 10/20/13 0614  NA 139  --  144  K 3.9  --  3.7  CL 100  --  105  CO2 25  --  25  GLUCOSE 257*  --  207*  BUN 26*  --  22  CREATININE 0.98 0.95 0.95  CALCIUM 8.5  --  8.4   Liver Function Tests:  Recent Labs Lab 10/19/13 1903 10/20/13 0614  AST 22 21  ALT 21 20  ALKPHOS 124* 118*  BILITOT 1.3* 1.2  PROT 7.0 7.1  ALBUMIN 2.9* 2.9*   CBC:  Recent Labs Lab  10/19/13 1903 10/19/13 2253 10/20/13 0614  WBC 8.0 8.0 7.2  NEUTROABS 5.0  --  4.5  HGB 9.5* 9.8* 9.4*  HCT 30.1* 30.8* 30.0*  MCV 82.0 81.9 82.9  PLT 219 213 205   Cardiac Enzymes:  Recent Labs Lab 10/19/13 2349 10/20/13 0614 10/20/13 1055  TROPONINI <0.30 <0.30 <0.30   BNP (last 3 results)  Recent Labs  10/19/13 1903  PROBNP 1605.0*   CBG:  Recent Labs Lab 10/19/13 1802 10/19/13 2356 10/20/13 0603 10/20/13 1141 10/20/13 1556  GLUCAP 250* 204* 176* 218* 195*    Recent Results (from the past 240 hour(s))  MRSA PCR SCREENING     Status: None   Collection Time    10/20/13  6:07 AM      Result Value Ref Range Status   MRSA by PCR NEGATIVE  NEGATIVE Final   Comment:            The GeneXpert MRSA Assay (FDA     approved for NASAL specimens     only), is one component of a     comprehensive MRSA colonization     surveillance program. It is not     intended to diagnose MRSA     infection nor to guide or     monitor treatment for     MRSA infections.     Studies: Dg Chest 2 View  10/19/2013   CLINICAL DATA:  Weakness, shortness of breath.  EXAM: CHEST  2 VIEW  COMPARISON:  January 08, 2012.  FINDINGS: Stable cardiomegaly. Status post coronary artery bypass graft. Left-sided pacemaker is unchanged in position. No pneumothorax or pleural effusion is noted. Faint right upper lobe opacity is noted which may represent focal pneumonia or atelectasis. Bony thorax is intact.  IMPRESSION: Faint right upper lobe opacity is noted concerning for focal atelectasis or possibly early pneumonia.   Electronically Signed   By: Sabino Dick M.D.   On: 10/19/2013 20:53    Scheduled Meds: . aspirin EC  81 mg Oral Daily  . atorvastatin  80 mg Oral q1800  . azithromycin  500 mg Intravenous Q24H  . carvedilol  6.25 mg Oral BID WC  . cefTRIAXone (ROCEPHIN)  IV  1 g Intravenous Q24H  . furosemide  60 mg Intravenous Q12H  . heparin  5,000 Units Subcutaneous 3 times per day  . [START ON  10/21/2013] influenza vac split quadrivalent PF  0.5 mL Intramuscular Tomorrow-1000  . insulin aspart  0-15 Units Subcutaneous TID WC  . insulin aspart  0-5 Units Subcutaneous QHS  . insulin glargine  35 Units Subcutaneous QHS  . lisinopril  5 mg Oral BID  . potassium chloride  20 mEq Oral Daily  . pregabalin  75 mg Oral Daily  . sodium chloride  3 mL Intravenous Q12H  . sodium chloride  3 mL Intravenous Q12H  . spironolactone  12.5 mg Oral Daily   Continuous Infusions:   Time > 30 minutes  Barton Dubois  [facility]  Pager [phone]. If 8PM-8AM, please contact night-coverage at [email or web address], password [facility] 10/20/2013, 6:30 PM  LOS: 1 day

## 2013-10-21 ENCOUNTER — Inpatient Hospital Stay (HOSPITAL_COMMUNITY): Payer: BC Managed Care – PPO

## 2013-10-21 DIAGNOSIS — I5021 Acute systolic (congestive) heart failure: Secondary | ICD-10-CM | POA: Diagnosis present

## 2013-10-21 DIAGNOSIS — J189 Pneumonia, unspecified organism: Secondary | ICD-10-CM | POA: Diagnosis present

## 2013-10-21 DIAGNOSIS — I509 Heart failure, unspecified: Secondary | ICD-10-CM

## 2013-10-21 DIAGNOSIS — I2589 Other forms of chronic ischemic heart disease: Secondary | ICD-10-CM

## 2013-10-21 LAB — BASIC METABOLIC PANEL
BUN: 28 mg/dL — ABNORMAL HIGH (ref 6–23)
CALCIUM: 8.6 mg/dL (ref 8.4–10.5)
CO2: 22 mEq/L (ref 19–32)
Chloride: 102 mEq/L (ref 96–112)
Creatinine, Ser: 0.98 mg/dL (ref 0.50–1.35)
GFR calc Af Amer: >90 mL/min (ref >90)
GFR calc non Af Amer: >90 mL/min (ref >90)
GLUCOSE: 227 mg/dL — AB (ref 70–99)
Potassium: 3.9 mEq/L (ref 3.7–5.3)
SODIUM: 141 meq/L (ref 137–147)

## 2013-10-21 LAB — CBC
HCT: 30.1 % — ABNORMAL LOW (ref 39.0–52.0)
Hemoglobin: 9.4 g/dL — ABNORMAL LOW (ref 13.0–17.0)
MCH: 26 pg (ref 26.0–34.0)
MCHC: 31.2 g/dL (ref 30.0–36.0)
MCV: 83.1 fL (ref 78.0–100.0)
Platelets: 197 10*3/uL (ref 150–400)
RBC: 3.62 MIL/uL — AB (ref 4.22–5.81)
RDW: 16.5 % — AB (ref 11.5–15.5)
WBC: 6.8 10*3/uL (ref 4.0–10.5)

## 2013-10-21 LAB — GLUCOSE, CAPILLARY
GLUCOSE-CAPILLARY: 161 mg/dL — AB (ref 70–99)
Glucose-Capillary: 161 mg/dL — ABNORMAL HIGH (ref 70–99)
Glucose-Capillary: 184 mg/dL — ABNORMAL HIGH (ref 70–99)
Glucose-Capillary: 134 mg/dL — ABNORMAL HIGH (ref 70–99)

## 2013-10-21 MED ORDER — CARVEDILOL 12.5 MG PO TABS
12.5000 mg | ORAL_TABLET | Freq: Two times a day (BID) | ORAL | Status: DC
  Administered 2013-10-21 – 2013-10-26 (×10): 12.5 mg via ORAL
  Filled 2013-10-21 (×12): qty 1

## 2013-10-21 MED ORDER — ACETAMINOPHEN 325 MG PO TABS
650.0000 mg | ORAL_TABLET | Freq: Four times a day (QID) | ORAL | Status: DC | PRN
  Administered 2013-10-21: 650 mg via ORAL
  Filled 2013-10-21: qty 2

## 2013-10-21 MED ORDER — INSULIN GLARGINE 100 UNIT/ML ~~LOC~~ SOLN
45.0000 [IU] | Freq: Every day | SUBCUTANEOUS | Status: DC
  Administered 2013-10-21 – 2013-10-25 (×5): 45 [IU] via SUBCUTANEOUS
  Filled 2013-10-21 (×6): qty 0.45

## 2013-10-21 MED ORDER — FUROSEMIDE 10 MG/ML IJ SOLN
60.0000 mg | Freq: Three times a day (TID) | INTRAMUSCULAR | Status: DC
Start: 2013-10-21 — End: 2013-10-22
  Administered 2013-10-21 – 2013-10-22 (×3): 60 mg via INTRAVENOUS
  Filled 2013-10-21 (×4): qty 6

## 2013-10-21 NOTE — Progress Notes (Signed)
[facility] PROGRESS NOTE Interim History: 53 y.o. year old male with prior history of CAD s/p CABG, ischemic cardiomyopathy w/ EF 10% by ECHO 2013 s/p ICD, IDDM presenting with dyspnea. Patient states that he is at upper respiratory infection symptoms including nasal congestion cough over the past 2-3 weeks. Was initially seen by PCP 2 weeks ago and getting some prescription cough medicine. Patient states his symptoms persisted over the course of 2-3 weeks with worsening dyspnea over the last week  Filed Weights   10/19/13 2328 10/20/13 0547 10/21/13 0602  Weight: 99.338 kg (219 lb) 99.4 kg (219 lb 2.2 oz) 100.5 kg (221 lb 9 oz)        Intake/Output Summary (Last 24 hours) at 10/21/13 1055 Last data filed at 10/21/13 0900  Gross per 24 hour  Intake    958 ml  Output   1425 ml  Net   -467 ml     Assessment/Plan: Acute systolic congestive heart failure: - Due none-compliance with low sodium diet.  - Continue strict I's and O's, continue IV lasix   - Daily weight, -low sodium diet  - continue lisinopril and increase b-blockers  CAP (community acquired pneumonia) - rocephin and azithromycin [id number]   Dyspnea  DM:  - continue SSI and lantus  - A1C 11.0; Will need close follow up as an outpatient   HTN:  - continue current antihypertensive regimen. BP stable   HLD:  - continue statins   CAD:  - no CP. Patient troponin neg  -continue ASA, b-blockers, ACE and statins      Code Status: Full  Family Communication: no family at bedside  Disposition Plan: home when medically stable    Consultants:  none  Procedures: ECHO: none  Antibiotics:  None  HPI/Subjective: SOB improved  Objective: Filed Vitals:   10/20/13 1804 10/20/13 2145 10/21/13 0602 10/21/13 1020  BP: 123/74 120/75 125/78 110/68  Pulse: 86 84 81   Temp:  98.1 F (36.7 C) 97.2 F (36.2 C)   TempSrc:  Oral Oral   Resp:  20 18   Height:      Weight:   100.5 kg (221 lb 9 oz)     SpO2:  99% 100%      Exam:  General: Alert, awake, oriented x3, in no acute distress.  HEENT: No bruits, no goiter. +JVD Heart: Regular rate and rhythm, without murmurs, rubs, gallops.  Lungs: Good air movement, clear Abdomen: Soft, nontender, nondistended, positive bowel sounds.  Neuro: Grossly intact, nonfocal.   Data Reviewed: Basic Metabolic Panel:  Recent Labs Lab 10/19/13 1903 10/19/13 2253 10/20/13 0614 10/21/13 0344  NA 139  --  144 141  K 3.9  --  3.7 3.9  CL 100  --  105 102  CO2 25  --  25 22  GLUCOSE 257*  --  207* 227*  BUN 26*  --  22 28*  CREATININE 0.98 0.95 0.95 0.98  CALCIUM 8.5  --  8.4 8.6   Liver Function Tests:  Recent Labs Lab 10/19/13 1903 10/20/13 0614  AST 22 21  ALT 21 20  ALKPHOS 124* 118*  BILITOT 1.3* 1.2  PROT 7.0 7.1  ALBUMIN 2.9* 2.9*   No results found for this basename: LIPASE, AMYLASE,  in the last 168 hours No results found for this basename: AMMONIA,  in the last 168 hours CBC:  Recent Labs Lab 10/19/13 1903 10/19/13 2253 10/20/13 0614 10/21/13 0344  WBC 8.0 8.0 7.2 6.8  NEUTROABS  5.0  --  4.5  --   HGB 9.5* 9.8* 9.4* 9.4*  HCT 30.1* 30.8* 30.0* 30.1*  MCV 82.0 81.9 82.9 83.1  PLT 219 213 205 197   Cardiac Enzymes:  Recent Labs Lab 10/19/13 2349 10/20/13 0614 10/20/13 1055  TROPONINI <0.30 <0.30 <0.30   BNP (last 3 results)  Recent Labs  10/19/13 1903  PROBNP 1605.0*   CBG:  Recent Labs Lab 10/20/13 0603 10/20/13 1141 10/20/13 1556 10/20/13 2144 10/21/13 0612  GLUCAP 176* 218* 195* 225* 161*    Recent Results (from the past 240 hour(s))  MRSA PCR SCREENING     Status: None   Collection Time    10/20/13  6:07 AM      Result Value Ref Range Status   MRSA by PCR NEGATIVE  NEGATIVE Final   Comment:            The GeneXpert MRSA Assay (FDA     approved for NASAL specimens     only), is one component of a     comprehensive MRSA colonization     surveillance program. It is not      intended to diagnose MRSA     infection nor to guide or     monitor treatment for     MRSA infections.     Studies: Dg Chest 2 View  10/21/2013   CLINICAL DATA:  Shortness of breath, infiltrates  EXAM: CHEST  2 VIEW  COMPARISON:  10/19/2013  FINDINGS: Cardiomegaly again noted. Status post CABG. Stable pacemaker position. No pulmonary edema. Persistent streaky density in right upper lobe suspicious for atelectasis or infiltrate.  IMPRESSION: Persistent streaky density in right upper lobe suspicious for atelectasis or infiltrate. Cardiomegaly again noted. Status post CABG. No pulmonary edema.   Electronically Signed   By: Lahoma Crocker M.D.   On: 10/21/2013 08:20   Dg Chest 2 View  10/19/2013   CLINICAL DATA:  Weakness, shortness of breath.  EXAM: CHEST  2 VIEW  COMPARISON:  January 08, 2012.  FINDINGS: Stable cardiomegaly. Status post coronary artery bypass graft. Left-sided pacemaker is unchanged in position. No pneumothorax or pleural effusion is noted. Faint right upper lobe opacity is noted which may represent focal pneumonia or atelectasis. Bony thorax is intact.  IMPRESSION: Faint right upper lobe opacity is noted concerning for focal atelectasis or possibly early pneumonia.   Electronically Signed   By: Sabino Dick M.D.   On: 10/19/2013 20:53    Scheduled Meds: . aspirin EC  81 mg Oral Daily  . atorvastatin  80 mg Oral q1800  . azithromycin  500 mg Intravenous Q24H  . carvedilol  6.25 mg Oral BID WC  . cefTRIAXone (ROCEPHIN)  IV  1 g Intravenous Q24H  . doxycycline  100 mg Oral Q12H  . furosemide  60 mg Intravenous Q12H  . heparin  5,000 Units Subcutaneous 3 times per day  . insulin aspart  0-15 Units Subcutaneous TID WC  . insulin aspart  0-5 Units Subcutaneous QHS  . insulin glargine  35 Units Subcutaneous QHS  . lisinopril  5 mg Oral BID  . potassium chloride  20 mEq Oral Daily  . pregabalin  75 mg Oral Daily  . spironolactone  12.5 mg Oral Daily   Continuous Infusions:    Charlynne Cousins  [facility] Pager [phone] If 8PM-8AM, please contact night-coverage at [email or web address], password [facility] 10/21/2013, 10:55 AM  LOS: 2 days

## 2013-10-21 NOTE — ED Provider Notes (Signed)
I saw and evaluated the patient, reviewed the resident's note and I agree with the findings and plan.   EKG Interpretation   Date/Time:  Monday October 19 2013 18:23:47 EDT Ventricular Rate:  90 PR Interval:  164 QRS Duration: 88 QT Interval:  371 QTC Calculation: 454 R Axis:   153 Text Interpretation:  Sinus rhythm Abnormal R-wave progression, late  transition Inferior infarct, age indeterminate , new T wave inversion  Inferior leads Confirmed by Maryan Rued  MD, Loree Fee ([id number]) on 10/19/2013  6:28:09 PM      I have reviewed EKG and agree with the resident interpretation.  you Pt with sob, cough and URI sx.  Pt has some distal edema but in no acute distress.  Labs with mild chf exacerbation and CXR with possible PNA and givne sx this is likely.  Pt treated for chf and pna and admitted for further care.  Blanchie Dessert, MD 10/21/13 [phone]

## 2013-10-21 NOTE — Progress Notes (Signed)
Report given to receiving RN. Patient up ambulating to restroom. No verbal complaints. No signs or symptoms of distress or discomfort.

## 2013-10-22 LAB — GLUCOSE, CAPILLARY
GLUCOSE-CAPILLARY: 195 mg/dL — AB (ref 70–99)
GLUCOSE-CAPILLARY: 138 mg/dL — AB (ref 70–99)
GLUCOSE-CAPILLARY: 163 mg/dL — AB (ref 70–99)
Glucose-Capillary: 172 mg/dL — ABNORMAL HIGH (ref 70–99)

## 2013-10-22 LAB — BASIC METABOLIC PANEL
BUN: 32 mg/dL — AB (ref 6–23)
CHLORIDE: 102 meq/L (ref 96–112)
CO2: 23 meq/L (ref 19–32)
Calcium: 8.8 mg/dL (ref 8.4–10.5)
Creatinine, Ser: 1.1 mg/dL (ref 0.50–1.35)
GFR calc Af Amer: 87 mL/min — ABNORMAL LOW (ref >90)
GFR calc non Af Amer: 75 mL/min — ABNORMAL LOW (ref >90)
GLUCOSE: 186 mg/dL — AB (ref 70–99)
POTASSIUM: 4.2 meq/L (ref 3.7–5.3)
Sodium: 138 mEq/L (ref 137–147)

## 2013-10-22 MED ORDER — METOLAZONE 5 MG PO TABS
5.0000 mg | ORAL_TABLET | Freq: Once | ORAL | Status: AC
  Administered 2013-10-22: 5 mg via ORAL
  Filled 2013-10-22: qty 1

## 2013-10-22 MED ORDER — ONDANSETRON HCL 4 MG/2ML IJ SOLN
4.0000 mg | Freq: Four times a day (QID) | INTRAMUSCULAR | Status: DC | PRN
  Administered 2013-10-22: 4 mg via INTRAVENOUS
  Filled 2013-10-22: qty 2

## 2013-10-22 MED ORDER — FUROSEMIDE 10 MG/ML IJ SOLN
8.0000 mg/h | INTRAVENOUS | Status: DC
  Administered 2013-10-22 – 2013-10-25 (×3): 8 mg/h via INTRAVENOUS
  Filled 2013-10-22 (×6): qty 25

## 2013-10-22 NOTE — Progress Notes (Signed)
[facility] PROGRESS NOTE Interim History: 53 y.o. year old male with prior history of CAD s/p CABG, ischemic cardiomyopathy w/ EF 10% by ECHO 2013 s/p ICD, IDDM presenting with dyspnea. Patient states that he is at upper respiratory infection symptoms including nasal congestion cough over the past 2-3 weeks. Was initially seen by PCP 2 weeks ago and getting some prescription cough medicine. Patient states his symptoms persisted over the course of 2-3 weeks with worsening dyspnea over the last week  Filed Weights   10/20/13 0547 10/21/13 0602 10/22/13 0553  Weight: 99.4 kg (219 lb 2.2 oz) 100.5 kg (221 lb 9 oz) 101.2 kg (223 lb 1.7 oz)        Intake/Output Summary (Last 24 hours) at 10/22/13 [id number] Last data filed at 10/22/13 0900  Gross per 24 hour  Intake   1238 ml  Output   1250 ml  Net    -12 ml     Assessment/Plan: Acute systolic congestive heart failure: - Due none-compliance with low sodium diet.  - Continue strict I's and O's, cont IV lasix  gtt. - Daily weight, -low sodium diet  - continue lisinopril and increase b-blockers  CAP (community acquired pneumonia) - rocephin and azithromycin [id number] - deescalate to doxy.  DM:  - continue SSI and lantus  - A1C 11.0; Will need close follow up as an outpatient   HTN:  - continue current antihypertensive regimen. BP stable   HLD:  - continue statins   CAD:  - no CP. Patient troponin neg  -continue ASA, b-blockers, ACE and statins      Code Status: Full  Family Communication: no family at bedside  Disposition Plan: home when medically stable    Consultants:  none  Procedures: ECHO: none  Antibiotics:  None  HPI/Subjective: Feels better.  Objective: Filed Vitals:   10/21/13 1708 10/21/13 2035 10/22/13 0553 10/22/13 0858  BP: 110/68 105/73 126/81 108/70  Pulse: 82 78 82 82  Temp:  97.3 F (36.3 C) 97.6 F (36.4 C)   TempSrc:  Oral Oral   Resp:  18 19   Height:      Weight:   101.2  kg (223 lb 1.7 oz)   SpO2:  92% 100%      Exam:  General: Alert, awake, oriented x3, in no acute distress.  HEENT: No bruits, no goiter. +JVD Heart: Regular rate and rhythm, 3+ edema. Lungs: Good air movement, clear Abdomen: Soft, nontender, nondistended, positive bowel sounds.    Data Reviewed: Basic Metabolic Panel:  Recent Labs Lab 10/19/13 1903 10/19/13 2253 10/20/13 0614 10/21/13 0344 10/22/13 0455  NA 139  --  144 141 138  K 3.9  --  3.7 3.9 4.2  CL 100  --  105 102 102  CO2 25  --  25 22 23   GLUCOSE 257*  --  207* 227* 186*  BUN 26*  --  22 28* 32*  CREATININE 0.98 0.95 0.95 0.98 1.10  CALCIUM 8.5  --  8.4 8.6 8.8   Liver Function Tests:  Recent Labs Lab 10/19/13 1903 10/20/13 0614  AST 22 21  ALT 21 20  ALKPHOS 124* 118*  BILITOT 1.3* 1.2  PROT 7.0 7.1  ALBUMIN 2.9* 2.9*   No results found for this basename: LIPASE, AMYLASE,  in the last 168 hours No results found for this basename: AMMONIA,  in the last 168 hours CBC:  Recent Labs Lab 10/19/13 1903 10/19/13 2253 10/20/13 0614 10/21/13 0344  WBC 8.0  8.0 7.2 6.8  NEUTROABS 5.0  --  4.5  --   HGB 9.5* 9.8* 9.4* 9.4*  HCT 30.1* 30.8* 30.0* 30.1*  MCV 82.0 81.9 82.9 83.1  PLT 219 213 205 197   Cardiac Enzymes:  Recent Labs Lab 10/19/13 2349 10/20/13 0614 10/20/13 1055  TROPONINI <0.30 <0.30 <0.30   BNP (last 3 results)  Recent Labs  10/19/13 1903  PROBNP 1605.0*   CBG:  Recent Labs Lab 10/21/13 0612 10/21/13 1108 10/21/13 1627 10/21/13 2055 10/22/13 0550  GLUCAP 161* 184* 134* 161* 172*    Recent Results (from the past 240 hour(s))  MRSA PCR SCREENING     Status: None   Collection Time    10/20/13  6:07 AM      Result Value Ref Range Status   MRSA by PCR NEGATIVE  NEGATIVE Final   Comment:            The GeneXpert MRSA Assay (FDA     approved for NASAL specimens     only), is one component of a     comprehensive MRSA colonization     surveillance program. It is  not     intended to diagnose MRSA     infection nor to guide or     monitor treatment for     MRSA infections.     Studies: Dg Chest 2 View  10/21/2013   CLINICAL DATA:  Shortness of breath, infiltrates  EXAM: CHEST  2 VIEW  COMPARISON:  10/19/2013  FINDINGS: Cardiomegaly again noted. Status post CABG. Stable pacemaker position. No pulmonary edema. Persistent streaky density in right upper lobe suspicious for atelectasis or infiltrate.  IMPRESSION: Persistent streaky density in right upper lobe suspicious for atelectasis or infiltrate. Cardiomegaly again noted. Status post CABG. No pulmonary edema.   Electronically Signed   By: Lahoma Crocker M.D.   On: 10/21/2013 08:20    Scheduled Meds: . aspirin EC  81 mg Oral Daily  . atorvastatin  80 mg Oral q1800  . carvedilol  12.5 mg Oral BID WC  . doxycycline  100 mg Oral Q12H  . furosemide  60 mg Intravenous 3 times per day  . heparin  5,000 Units Subcutaneous 3 times per day  . insulin aspart  0-15 Units Subcutaneous TID WC  . insulin aspart  0-5 Units Subcutaneous QHS  . insulin glargine  45 Units Subcutaneous QHS  . lisinopril  5 mg Oral BID  . potassium chloride  20 mEq Oral Daily  . pregabalin  75 mg Oral Daily  . spironolactone  12.5 mg Oral Daily   Continuous Infusions:    Charlynne Cousins  [facility] Pager [phone] If 8PM-8AM, please contact night-coverage at [email or web address], password [facility] 10/22/2013, 9:58 AM  LOS: 3 days

## 2013-10-22 NOTE — Consult Note (Signed)
Heart Failure Navigator Consult Note  Presentation: Marcus Johnson --This is a 53 y.o. year old male with prior history of CAD s/p CABG, ischemic cardiomyopathy w/ EF 10% by ECHO 2013 s/p ICD, IDDM presenting with dyspnea. Patient states that he is at upper respiratory infection symptoms including nasal congestion cough over the past 2-3 weeks. Was initially seen by PCP 2 weeks ago and getting some prescription cough medicine. Patient states his symptoms persisted over the course of 2-3 weeks with worsening dyspnea over the last week. Denies any fevers or chills. Dyspnea as predominantly with exertion. Has also noticed some mild lower extremity swelling and weight gain. Has been compliant of diuretic regimen. No orthopnea or PND. Denies any high salt intake. Has been taking NSAIDs over three week course. Had mild episode of dizziness earlier today. This lasted 1-2 minutes and self resolved. Has had prior episodes of dizziness related to CHF in the past.     Past Medical History  Diagnosis Date  . Hypertension   . Coronary artery disease     a) s/p CABG 2002 b) LHC 5/13:  dLM 80%, LAD subtotally occluded, pCFX occluded, pRCA 50%, mid? Occlusion with high take off of the PDA with 70% multiple lesions-not bypassed and supplies collaterals to LAD, LIMA-IM/ramus ok, S-OM ok, S-PLA branches ok. Medical therapy was recommended  . Chronic systolic heart failure     Secondary to ischemic cardiomyopathy with EF 35-40% by cath 2002, last echo 11/2008 - EF 20-25%  . Diabetes mellitus     type 2 requiring insulin   . MRSA (methicillin resistant Staphylococcus aureus)     Status post right foot plantar deep infection with MRSA status post  I&D 10/2008  . Noncompliance   . Peripheral neuropathy   . Ischemic cardiomyopathy     a. Echo 5/13: Mild LVE, mild LVH, EF 10%, anteroseptal, lateral, apical AK, mild MR, mild LAE, moderately reduced RVSF, mild RAE, PASP 60  . Single-chamber  implantable cardiac  defibrillator - [employer]   . Syncope     History   Social History  . Marital Status: Single    Spouse Name: N/A    Number of Children: N/A  . Years of Education: N/A   Social History Main Topics  . Smoking status: Never Smoker   . Smokeless tobacco: Never Used  . Alcohol Use: No  . Drug Use: No  . Sexual Activity: Yes   Other Topics Concern  . None   Social History Narrative  . None    ECHO:Study Conclusions- 01/04/12  - Left ventricle: The cavity size was mildly dilated. Wall thickness was increased in a pattern of mild LVH. The estimated ejection fraction was 10%. There is akinesis of the anteroseptal, lateral, and apical myocardium. There is akinesis of the mid-distalinferior myocardium. - Mitral valve: Mild regurgitation. - Left atrium: The atrium was mildly dilated. - Right ventricle: Systolic function was moderately reduced. - Right atrium: The atrium was mildly dilated. - Pulmonary arteries: Systolic pressure was moderately increased. PA peak pressure: 103mm Hg (S).    BNP    Component Value Date/Time   PROBNP 1605.0* 10/19/2013 1903    Education Assessment and Provision:  Detailed education and instructions provided on heart failure disease management including the following:  Signs and symptoms of Heart Failure When to call the physician Importance of daily weights Low sodium diet  Fluid restriction Medication management Anticipated future follow-up appointments  Patient education given on each of the above topics.  Patient acknowledges understanding and acceptance of all instructions. Patient has received all information in previous visits and can teach back above topics.  He does admit to not always adhering to a low sodium diet as he and his family frequently "eat out".  Education Materials:  "Living Better With Heart Failure" Booklet, Daily Weight Tracker Tool and Heart Failure Educational Video.   High Risk Criteria for Readmission and/or  Poor Patient Outcomes:    EF <30%- Yes  2 or more admissions in 6 months-No  Difficult social situation-No  Demonstrates medication noncompliance-No   Barriers of Care:  Compliance regarding HF instructions and low sodium diet adherence.  Discharge Planning:   Plans to discharge to home with his son and grandchildren.

## 2013-10-23 LAB — BASIC METABOLIC PANEL
BUN: 37 mg/dL — ABNORMAL HIGH (ref 6–23)
CALCIUM: 9.6 mg/dL (ref 8.4–10.5)
CO2: 24 meq/L (ref 19–32)
CREATININE: 1.17 mg/dL (ref 0.50–1.35)
Chloride: 102 mEq/L (ref 96–112)
GFR, EST AFRICAN AMERICAN: 81 mL/min — AB (ref >90)
GFR, EST NON AFRICAN AMERICAN: 70 mL/min — AB (ref >90)
Glucose, Bld: 87 mg/dL (ref 70–99)
Potassium: 4.1 mEq/L (ref 3.7–5.3)
SODIUM: 141 meq/L (ref 137–147)

## 2013-10-23 LAB — GLUCOSE, CAPILLARY
GLUCOSE-CAPILLARY: 82 mg/dL (ref 70–99)
GLUCOSE-CAPILLARY: 173 mg/dL — AB (ref 70–99)
Glucose-Capillary: 117 mg/dL — ABNORMAL HIGH (ref 70–99)
Glucose-Capillary: 127 mg/dL — ABNORMAL HIGH (ref 70–99)

## 2013-10-23 MED ORDER — SPIRONOLACTONE 25 MG PO TABS
25.0000 mg | ORAL_TABLET | Freq: Every day | ORAL | Status: DC
  Administered 2013-10-23 – 2013-10-26 (×4): 25 mg via ORAL
  Filled 2013-10-23 (×4): qty 1

## 2013-10-23 MED ORDER — METOLAZONE 5 MG PO TABS
5.0000 mg | ORAL_TABLET | Freq: Once | ORAL | Status: AC
  Administered 2013-10-23: 5 mg via ORAL
  Filled 2013-10-23: qty 1

## 2013-10-23 MED ORDER — SPIRONOLACTONE 25 MG PO TABS: 25.0000 mg | ORAL_TABLET | Freq: Every day | ORAL | Status: DC

## 2013-10-23 NOTE — Progress Notes (Signed)
Introduced self to pt, Marine scientist for 3P - 7p.  Verbalized understanding.  Call bell at reach and denies pain.  Instructed to call as needed.  Karie Kirks, Therapist, sports.

## 2013-10-23 NOTE — Progress Notes (Signed)
Spoke with patient about diabetes and home regimen for diabetes control.  Patient reports that his PCP helps him with diabetes management.  Patient reports that he takes Lantus 40 units QHS and Novolog 70/30 20 units with breakfast and supper as an outpatient.  Discussed A1C (11.0% on 10/19/13) results and explained what an A1C is, basic pathophysiology of DM Type 2, basic home care, importance of checking CBGs and maintaining good CBG control to prevent long-term and short-term complications. Patient reports that he checks his blood sugar and it usually runs 100-150 mg/dl.  However, since he has been sick over the past month his blood sugars have been higher.  Patient states that his PCP checks his A1C about every 3 months and he is not sure what his last A1C was.  In talking with Marcus Johnson, he admits that at times he may have to go without insulin if he is not able to afford it that particular week when he runs out.  He reports that his insulins are at least $100 out of pocket (for copays) per month.  Inquired about patient's knowledge regarding patient medication assistance programs for insulins.  Patient reports that he has never heard of any such programs.  Encouraged patient to check into patient assistance programs for Lantus and Novolog to see if he would be able to qualify for free insulins (if he meets certain criteria).  Patient states that he will have his son help him look online and find the paperwork to fill out so he can apply for the programs.  Patient was appreciative of information discussed and verbalized understanding. Patient states that he does not have any further questions related to diabetes at this time.  Thanks, Marcus Alderman, RN, MSN, CCRN Diabetes Coordinator Inpatient [facility] [phone] (Team Pager) [phone] ([facility] office) [phone] [facility] office)

## 2013-10-23 NOTE — Progress Notes (Signed)
[facility] PROGRESS NOTE Interim History: 53 y.o. year old male with prior history of CAD s/p CABG, ischemic cardiomyopathy w/ EF 10% by ECHO 2013 s/p ICD, IDDM presenting with dyspnea. Patient states that he is at upper respiratory infection symptoms including nasal congestion cough over the past 2-3 weeks. Was initially seen by PCP 2 weeks ago and getting some prescription cough medicine. Patient states his symptoms persisted over the course of 2-3 weeks with worsening dyspnea over the last week  Filed Weights   10/21/13 0602 10/22/13 0553 10/23/13 0629  Weight: 100.5 kg (221 lb 9 oz) 101.2 kg (223 lb 1.7 oz) 99.1 kg (218 lb 7.6 oz)        Intake/Output Summary (Last 24 hours) at 10/23/13 1017 Last data filed at 10/23/13 0820  Gross per 24 hour  Intake 847.07 ml  Output   3900 ml  Net -3052.93 ml     Assessment/Plan: Acute systolic congestive heart failure: - Due none-compliance with low sodium diet. Good UOP. - Continue strict I's and O's, continue IV lasix gtt and single dose of metolazone. - Daily weight, low sodium diet. - Continue lisinopril and increase b-blockers  CAP (community acquired pneumonia) - Rocephin and azithromycin 3.17.2015, change to doxy 3.19.2015.   Dyspnea  DM:  - continue SSI and lantus  - A1C 11.0; Will need close follow up as an outpatient. - op diabetis  HTN:  - continue current antihypertensive regimen. BP stable   HLD:  - continue statins   CAD:  - no CP. Patient troponin neg  -continue ASA, b-blockers, ACE and statins      Code Status: Full  Family Communication: no family at bedside  Disposition Plan: home when medically stable    Consultants:  none  Procedures: ECHO: none  Antibiotics:  None  HPI/Subjective: No complains  Objective: Filed Vitals:   10/22/13 0858 10/22/13 1351 10/22/13 2030 10/23/13 0629  BP: 108/70 102/75 109/72 135/86  Pulse: 82 82 79 80  Temp:  97.8 F (36.6 C) 97.9 F (36.6 C)  98 F (36.7 C)  TempSrc:  Oral Oral Oral  Resp:  18 18 18   Height:      Weight:    99.1 kg (218 lb 7.6 oz)  SpO2:  100% 100% 100%     Exam:  General: Alert, awake, oriented x3, in no acute distress.  HEENT: No bruits, no goiter. +JVD Heart: Regular rate and rhythm, without murmurs, rubs, gallops.  Lungs: Good air movement, clear Abdomen: Soft, nontender, nondistended, positive bowel sounds.  Neuro: Grossly intact, nonfocal.   Data Reviewed: Basic Metabolic Panel:  Recent Labs Lab 10/19/13 1903 10/19/13 2253 10/20/13 0614 10/21/13 0344 10/22/13 0455 10/23/13 0539  NA 139  --  144 141 138 141  K 3.9  --  3.7 3.9 4.2 4.1  CL 100  --  105 102 102 102  CO2 25  --  25 22 23 24   GLUCOSE 257*  --  207* 227* 186* 87  BUN 26*  --  22 28* 32* 37*  CREATININE 0.98 0.95 0.95 0.98 1.10 1.17  CALCIUM 8.5  --  8.4 8.6 8.8 9.6   Liver Function Tests:  Recent Labs Lab 10/19/13 1903 10/20/13 0614  AST 22 21  ALT 21 20  ALKPHOS 124* 118*  BILITOT 1.3* 1.2  PROT 7.0 7.1  ALBUMIN 2.9* 2.9*   No results found for this basename: LIPASE, AMYLASE,  in the last 168 hours No results found for this  basename: AMMONIA,  in the last 168 hours CBC:  Recent Labs Lab 10/19/13 1903 10/19/13 2253 10/20/13 0614 10/21/13 0344  WBC 8.0 8.0 7.2 6.8  NEUTROABS 5.0  --  4.5  --   HGB 9.5* 9.8* 9.4* 9.4*  HCT 30.1* 30.8* 30.0* 30.1*  MCV 82.0 81.9 82.9 83.1  PLT 219 213 205 197   Cardiac Enzymes:  Recent Labs Lab 10/19/13 2349 10/20/13 0614 10/20/13 1055  TROPONINI <0.30 <0.30 <0.30   BNP (last 3 results)  Recent Labs  10/19/13 1903  PROBNP 1605.0*   CBG:  Recent Labs Lab 10/22/13 0550 10/22/13 1055 10/22/13 1639 10/22/13 2107 10/23/13 0540  GLUCAP 172* 195* 138* 163* 82    Recent Results (from the past 240 hour(s))  MRSA PCR SCREENING     Status: None   Collection Time    10/20/13  6:07 AM      Result Value Ref Range Status   MRSA by PCR NEGATIVE  NEGATIVE  Final   Comment:            The GeneXpert MRSA Assay (FDA     approved for NASAL specimens     only), is one component of a     comprehensive MRSA colonization     surveillance program. It is not     intended to diagnose MRSA     infection nor to guide or     monitor treatment for     MRSA infections.     Studies: No results found.  Scheduled Meds: . aspirin EC  81 mg Oral Daily  . atorvastatin  80 mg Oral q1800  . carvedilol  12.5 mg Oral BID WC  . doxycycline  100 mg Oral Q12H  . heparin  5,000 Units Subcutaneous 3 times per day  . insulin aspart  0-15 Units Subcutaneous TID WC  . insulin aspart  0-5 Units Subcutaneous QHS  . insulin glargine  45 Units Subcutaneous QHS  . lisinopril  5 mg Oral BID  . potassium chloride  20 mEq Oral Daily  . pregabalin  75 mg Oral Daily  . spironolactone  12.5 mg Oral Daily   Continuous Infusions: . furosemide (LASIX) infusion 8 mg/hr (10/22/13 1243)     Charlynne Cousins  [facility] Pager [phone] If 8PM-8AM, please contact night-coverage at [email or web address], password [facility] 10/23/2013, 10:17 AM  LOS: 4 days

## 2013-10-23 NOTE — Care Management Note (Signed)
    Page 1 of 1   10/23/2013     2:46:52 PM   CARE MANAGEMENT NOTE 10/23/2013  Patient:  Marcus Johnson, Marcus Johnson   Account Number:  [id number]  Date Initiated:  10/23/2013  Documentation initiated by:  [facility]  Subjective/Objective Assessment:   53 y.o. year old male with prior history of CAD s/p CABG, ischemic cardiomyopathy w/ EF 10% by ECHO 2013 s/p ICD, IDDM presenting with dyspnea. //Home with son     Action/Plan:   Rocephin and azithromycin. Blood and sputum cxs. Urine strep and legionella. Gentle diuresis. Strict Is and Os. Daily weights. Cycle CEs.  Cards consult if dyspnea persists despite treatment. //Access for [facility] needs   Anticipated DC Date:  10/26/2013   Anticipated DC Plan:  [place]         Choice offered to / List presented to:             Status of service:   Medicare Important Message given?   (If response is "NO", the following Medicare IM given date fields will be blank) Date Medicare IM given:   Date Additional Medicare IM given:    Discharge Disposition:    Per UR Regulation:    If discussed at Long Length of Stay Meetings, dates discussed:    Comments:

## 2013-10-24 LAB — GLUCOSE, CAPILLARY
GLUCOSE-CAPILLARY: 97 mg/dL (ref 70–99)
GLUCOSE-CAPILLARY: 159 mg/dL — AB (ref 70–99)
Glucose-Capillary: 109 mg/dL — ABNORMAL HIGH (ref 70–99)
Glucose-Capillary: 197 mg/dL — ABNORMAL HIGH (ref 70–99)

## 2013-10-24 LAB — BASIC METABOLIC PANEL
BUN: 39 mg/dL — ABNORMAL HIGH (ref 6–23)
CHLORIDE: 97 meq/L (ref 96–112)
CO2: 27 meq/L (ref 19–32)
CREATININE: 1.29 mg/dL (ref 0.50–1.35)
Calcium: 9.5 mg/dL (ref 8.4–10.5)
GFR calc Af Amer: 72 mL/min — ABNORMAL LOW (ref >90)
GFR calc non Af Amer: 62 mL/min — ABNORMAL LOW (ref >90)
GLUCOSE: 154 mg/dL — AB (ref 70–99)
Potassium: 4.3 mEq/L (ref 3.7–5.3)
Sodium: 139 mEq/L (ref 137–147)

## 2013-10-24 MED ORDER — METOLAZONE 5 MG PO TABS
5.0000 mg | ORAL_TABLET | Freq: Once | ORAL | Status: AC
  Administered 2013-10-24: 5 mg via ORAL
  Filled 2013-10-24: qty 1

## 2013-10-24 NOTE — Progress Notes (Signed)
[facility] PROGRESS NOTE Interim History: 53 y.o. year old male with prior history of CAD s/p CABG, ischemic cardiomyopathy w/ EF 10% by ECHO 2013 s/p ICD, IDDM presenting with dyspnea. Patient states that he is at upper respiratory infection symptoms including nasal congestion cough over the past 2-3 weeks. Was initially seen by PCP 2 weeks ago and getting some prescription cough medicine. Patient states his symptoms persisted over the course of 2-3 weeks with worsening dyspnea over the last week  Filed Weights   10/22/13 0553 10/23/13 0629 10/24/13 0459  Weight: 101.2 kg (223 lb 1.7 oz) 99.1 kg (218 lb 7.6 oz) 94.847 kg (209 lb 1.6 oz)        Intake/Output Summary (Last 24 hours) at 10/24/13 0932 Last data filed at 10/24/13 0749  Gross per 24 hour  Intake  729.2 ml  Output   5325 ml  Net -4595.8 ml     Assessment/Plan: Acute systolic congestive heart failure: - Due none-compliance with low sodium diet. Good UOP. - Continue strict I's and O's, continue IV lasix gtt and single dose of metolazone. - Daily weight, low sodium diet. - Continue lisinopril and increase b-blockers - 101.2->94.8  CAP (community acquired pneumonia) - Rocephin and azithromycin 3.17.2015, change to doxy 3.19.2015.  DM:  - continue SSI and lantus  - A1C 11.0; Will need close follow up as an outpatient. - op diabetis  HTN:  - continue current antihypertensive regimen. BP stable   HLD:  - continue statins   CAD:  - no CP. Patient troponin neg  -continue ASA, b-blockers, ACE and statins      Code Status: Full  Family Communication: no family at bedside  Disposition Plan: home when medically stable    Consultants:  none  Procedures: ECHO: none  Antibiotics:  None  HPI/Subjective: Wants to go home.  Objective: Filed Vitals:   10/23/13 0629 10/23/13 1441 10/23/13 2244 10/24/13 0459  BP: 135/86 119/71 109/66 107/71  Pulse: 80 81 62 79  Temp: 98 F (36.7 C) 98.6 F  (37 C) 97.4 F (36.3 C) 98.2 F (36.8 C)  TempSrc: Oral Oral Oral Oral  Resp: 18 18 18 18   Height:      Weight: 99.1 kg (218 lb 7.6 oz)   94.847 kg (209 lb 1.6 oz)  SpO2: 100% 97% 94% 96%     Exam:  General: Alert, awake, oriented x3, in no acute distress.  HEENT: No bruits, no goiter. +JVD Heart: Regular rate and rhythm, no lower extremity edema. Lungs: Good air movement, clear Abdomen: Soft, nontender, nondistended, positive bowel sounds.  Neuro: Grossly intact, nonfocal.   Data Reviewed: Basic Metabolic Panel:  Recent Labs Lab 10/19/13 1903 10/19/13 2253 10/20/13 0614 10/21/13 0344 10/22/13 0455 10/23/13 0539  NA 139  --  144 141 138 141  K 3.9  --  3.7 3.9 4.2 4.1  CL 100  --  105 102 102 102  CO2 25  --  25 22 23 24   GLUCOSE 257*  --  207* 227* 186* 87  BUN 26*  --  22 28* 32* 37*  CREATININE 0.98 0.95 0.95 0.98 1.10 1.17  CALCIUM 8.5  --  8.4 8.6 8.8 9.6   Liver Function Tests:  Recent Labs Lab 10/19/13 1903 10/20/13 0614  AST 22 21  ALT 21 20  ALKPHOS 124* 118*  BILITOT 1.3* 1.2  PROT 7.0 7.1  ALBUMIN 2.9* 2.9*   No results found for this basename: LIPASE, AMYLASE,  in  the last 168 hours No results found for this basename: AMMONIA,  in the last 168 hours CBC:  Recent Labs Lab 10/19/13 1903 10/19/13 2253 10/20/13 0614 10/21/13 0344  WBC 8.0 8.0 7.2 6.8  NEUTROABS 5.0  --  4.5  --   HGB 9.5* 9.8* 9.4* 9.4*  HCT 30.1* 30.8* 30.0* 30.1*  MCV 82.0 81.9 82.9 83.1  PLT 219 213 205 197   Cardiac Enzymes:  Recent Labs Lab 10/19/13 2349 10/20/13 0614 10/20/13 1055  TROPONINI <0.30 <0.30 <0.30   BNP (last 3 results)  Recent Labs  10/19/13 1903  PROBNP 1605.0*   CBG:  Recent Labs Lab 10/23/13 0540 10/23/13 1122 10/23/13 1651 10/23/13 2101 10/24/13 0620  GLUCAP 82 117* 127* 173* 97    Recent Results (from the past 240 hour(s))  MRSA PCR SCREENING     Status: None   Collection Time    10/20/13  6:07 AM      Result Value  Ref Range Status   MRSA by PCR NEGATIVE  NEGATIVE Final   Comment:            The GeneXpert MRSA Assay (FDA     approved for NASAL specimens     only), is one component of a     comprehensive MRSA colonization     surveillance program. It is not     intended to diagnose MRSA     infection nor to guide or     monitor treatment for     MRSA infections.     Studies: No results found.  Scheduled Meds: . aspirin EC  81 mg Oral Daily  . atorvastatin  80 mg Oral q1800  . carvedilol  12.5 mg Oral BID WC  . doxycycline  100 mg Oral Q12H  . heparin  5,000 Units Subcutaneous 3 times per day  . insulin aspart  0-15 Units Subcutaneous TID WC  . insulin aspart  0-5 Units Subcutaneous QHS  . insulin glargine  45 Units Subcutaneous QHS  . lisinopril  5 mg Oral BID  . potassium chloride  20 mEq Oral Daily  . pregabalin  75 mg Oral Daily  . spironolactone  25 mg Oral Daily   Continuous Infusions: . furosemide (LASIX) infusion 8 mg/hr (10/23/13 1251)     Venetia Constable Marguarite Arbour  [facility] Pager [phone] If 8PM-8AM, please contact night-coverage at [email or web address], password [facility] 10/24/2013, 9:32 AM  LOS: 5 days

## 2013-10-24 NOTE — Progress Notes (Signed)
Pt had SVT HR 169 non sustained, informed by central tele.  Noted pt asymptomatic.  Pt in bed & family member at the bedside.  BP95/63, P74.  Dr Lorrin Goodell made aware.  No new order will continue to monitor.  Karie Kirks, Therapist, sports.

## 2013-10-25 LAB — GLUCOSE, CAPILLARY
GLUCOSE-CAPILLARY: 125 mg/dL — AB (ref 70–99)
Glucose-Capillary: 106 mg/dL — ABNORMAL HIGH (ref 70–99)
Glucose-Capillary: 212 mg/dL — ABNORMAL HIGH (ref 70–99)
Glucose-Capillary: 193 mg/dL — ABNORMAL HIGH (ref 70–99)

## 2013-10-25 LAB — BASIC METABOLIC PANEL
BUN: 44 mg/dL — ABNORMAL HIGH (ref 6–23)
CO2: 28 meq/L (ref 19–32)
CREATININE: 1.23 mg/dL (ref 0.50–1.35)
Calcium: 10 mg/dL (ref 8.4–10.5)
Chloride: 96 mEq/L (ref 96–112)
GFR calc Af Amer: 76 mL/min — ABNORMAL LOW (ref >90)
GFR calc non Af Amer: 66 mL/min — ABNORMAL LOW (ref >90)
GLUCOSE: 107 mg/dL — AB (ref 70–99)
Potassium: 3.9 mEq/L (ref 3.7–5.3)
Sodium: 139 mEq/L (ref 137–147)

## 2013-10-25 MED ORDER — FUROSEMIDE 80 MG PO TABS
80.0000 mg | ORAL_TABLET | Freq: Two times a day (BID) | ORAL | Status: DC
  Administered 2013-10-25 – 2013-10-26 (×3): 80 mg via ORAL
  Filled 2013-10-25 (×5): qty 1

## 2013-10-25 NOTE — Progress Notes (Signed)
[facility] PROGRESS NOTE Interim History: 53 y.o. year old male with prior history of CAD s/p CABG, ischemic cardiomyopathy w/ EF 10% by ECHO 2013 s/p ICD, IDDM presenting with dyspnea. Patient states that he is at upper respiratory infection symptoms including nasal congestion cough over the past 2-3 weeks. Was initially seen by PCP 2 weeks ago and getting some prescription cough medicine. Patient states his symptoms persisted over the course of 2-3 weeks with worsening dyspnea over the last week  Filed Weights   10/23/13 0629 10/24/13 0459 10/25/13 0456  Weight: 99.1 kg (218 lb 7.6 oz) 94.847 kg (209 lb 1.6 oz) 91.128 kg (200 lb 14.4 oz)        Intake/Output Summary (Last 24 hours) at 10/25/13 0902 Last data filed at 10/25/13 0700  Gross per 24 hour  Intake    832 ml  Output   4450 ml  Net  -3618 ml     Assessment/Plan: Acute systolic congestive heart failure: - Due none-compliance with low sodium diet. Good UOP. - Continue strict I's and O's, Started on IV lasix gtt, change to oral, as he has no JVD. - Daily weight, low sodium diet. - Continue lisinopril and increase b-blockers - 101.2->94.8-> 91.1 kg. - home in am.  CAP (community acquired pneumonia) - Rocephin and azithromycin 3.17.2015. - change to doxy 3.19.2015.  DM:  - continue SSI and lantus  - A1C 11.0; Will need close follow up as an outpatient. - op diabetis  HTN:  - continue current antihypertensive regimen. BP stable   HLD:  - continue statins   CAD:  - no CP. Patient troponin neg  -continue ASA, b-blockers, ACE and statins      Code Status: Full  Family Communication: no family at bedside  Disposition Plan: home when medically stable    Consultants:  none  Procedures: ECHO: none  Antibiotics:  None  HPI/Subjective: Wants to go home.  Objective: Filed Vitals:   10/24/13 0948 10/24/13 1334 10/24/13 2102 10/25/13 0456  BP: 106/62 95/63 119/60 98/63  Pulse: 80 79 82 78    Temp: 97.9 F (36.6 C) 97.9 F (36.6 C) 97.8 F (36.6 C) 98.2 F (36.8 C)  TempSrc: Oral Oral Oral Oral  Resp: 20 20 18 18   Height:      Weight:    91.128 kg (200 lb 14.4 oz)  SpO2: 97% 97% 100% 100%     Exam:  General: Alert, awake, oriented x3, in no acute distress.  HEENT: No bruits, no goiter. +JVD Heart: Regular rate and rhythm, no lower extremity edema. Lungs: Good air movement, clear Abdomen: Soft, nontender, nondistended, positive bowel sounds.  Neuro: Grossly intact, nonfocal.   Data Reviewed: Basic Metabolic Panel:  Recent Labs Lab 10/21/13 0344 10/22/13 0455 10/23/13 0539 10/24/13 1317 10/25/13 0555  NA 141 138 141 139 139  K 3.9 4.2 4.1 4.3 3.9  CL 102 102 102 97 96  CO2 22 23 24 27 28   GLUCOSE 227* 186* 87 154* 107*  BUN 28* 32* 37* 39* 44*  CREATININE 0.98 1.10 1.17 1.29 1.23  CALCIUM 8.6 8.8 9.6 9.5 10.0   Liver Function Tests:  Recent Labs Lab 10/19/13 1903 10/20/13 0614  AST 22 21  ALT 21 20  ALKPHOS 124* 118*  BILITOT 1.3* 1.2  PROT 7.0 7.1  ALBUMIN 2.9* 2.9*   No results found for this basename: LIPASE, AMYLASE,  in the last 168 hours No results found for this basename: AMMONIA,  in the  last 168 hours CBC:  Recent Labs Lab 10/19/13 1903 10/19/13 2253 10/20/13 0614 10/21/13 0344  WBC 8.0 8.0 7.2 6.8  NEUTROABS 5.0  --  4.5  --   HGB 9.5* 9.8* 9.4* 9.4*  HCT 30.1* 30.8* 30.0* 30.1*  MCV 82.0 81.9 82.9 83.1  PLT 219 213 205 197   Cardiac Enzymes:  Recent Labs Lab 10/19/13 2349 10/20/13 0614 10/20/13 1055  TROPONINI <0.30 <0.30 <0.30   BNP (last 3 results)  Recent Labs  10/19/13 1903  PROBNP 1605.0*   CBG:  Recent Labs Lab 10/24/13 0620 10/24/13 1119 10/24/13 1614 10/24/13 2105 10/25/13 0613  GLUCAP 97 109* 159* 197* 106*    Recent Results (from the past 240 hour(s))  MRSA PCR SCREENING     Status: None   Collection Time    10/20/13  6:07 AM      Result Value Ref Range Status   MRSA by PCR  NEGATIVE  NEGATIVE Final   Comment:            The GeneXpert MRSA Assay (FDA     approved for NASAL specimens     only), is one component of a     comprehensive MRSA colonization     surveillance program. It is not     intended to diagnose MRSA     infection nor to guide or     monitor treatment for     MRSA infections.     Studies: No results found.  Scheduled Meds: . aspirin EC  81 mg Oral Daily  . atorvastatin  80 mg Oral q1800  . carvedilol  12.5 mg Oral BID WC  . doxycycline  100 mg Oral Q12H  . furosemide  80 mg Oral BID  . heparin  5,000 Units Subcutaneous 3 times per day  . insulin aspart  0-15 Units Subcutaneous TID WC  . insulin aspart  0-5 Units Subcutaneous QHS  . insulin glargine  45 Units Subcutaneous QHS  . lisinopril  5 mg Oral BID  . potassium chloride  20 mEq Oral Daily  . spironolactone  25 mg Oral Daily   Continuous Infusions:     Charlynne Cousins  [facility] Pager [phone] If 8PM-8AM, please contact night-coverage at [email or web address], password [facility] 10/25/2013, 9:02 AM  LOS: 6 days

## 2013-10-26 LAB — BASIC METABOLIC PANEL
BUN: 55 mg/dL — ABNORMAL HIGH (ref 6–23)
CO2: 27 meq/L (ref 19–32)
CREATININE: 1.27 mg/dL (ref 0.50–1.35)
Calcium: 9.8 mg/dL (ref 8.4–10.5)
Chloride: 97 mEq/L (ref 96–112)
GFR calc Af Amer: 74 mL/min — ABNORMAL LOW (ref >90)
GFR calc non Af Amer: 63 mL/min — ABNORMAL LOW (ref >90)
GLUCOSE: 145 mg/dL — AB (ref 70–99)
Potassium: 4.7 mEq/L (ref 3.7–5.3)
Sodium: 139 mEq/L (ref 137–147)

## 2013-10-26 LAB — GLUCOSE, CAPILLARY: GLUCOSE-CAPILLARY: 111 mg/dL — AB (ref 70–99)

## 2013-10-26 MED ORDER — CARVEDILOL 12.5 MG PO TABS: 12.5000 mg | ORAL_TABLET | Freq: Two times a day (BID) | ORAL | Status: DC

## 2013-10-26 MED ORDER — INSULIN ASPART 100 UNIT/ML ~~LOC~~ SOLN: 5.0000 [IU] | Freq: Three times a day (TID) | SUBCUTANEOUS | Status: DC

## 2013-10-26 MED ORDER — INSULIN ASPART 100 UNIT/ML FLEXPEN: 5.0000 [IU] | PEN_INJECTOR | Freq: Three times a day (TID) | SUBCUTANEOUS | Status: DC

## 2013-10-26 MED ORDER — DOXYCYCLINE HYCLATE 100 MG PO TABS: 100.0000 mg | ORAL_TABLET | Freq: Two times a day (BID) | ORAL | Status: DC

## 2013-10-26 MED ORDER — INSULIN GLARGINE 100 UNIT/ML SOLOSTAR PEN: 45.0000 [IU] | PEN_INJECTOR | Freq: Every day | SUBCUTANEOUS | Status: DC

## 2013-10-26 MED ORDER — INSULIN GLARGINE 100 UNIT/ML ~~LOC~~ SOLN: 45.0000 [IU] | Freq: Every day | SUBCUTANEOUS | Status: DC

## 2013-10-26 NOTE — Discharge Summary (Addendum)
Physician Discharge Summary  Marcus Johnson [id number] DOB: [birth year] DOA: 10/19/2013  PCP: [name], MD  Admit date: 10/19/2013 Discharge date: 10/26/2013  Time spent: 40 minutes  Recommendations for Outpatient Follow-up:  1. Follow up with cardiology in 1 week. Titrate HF meds as tolerate it.  BNP    Component Value Date/Time   PROBNP 1605.0* 10/19/2013 1903   Filed Weights   10/24/13 0459 10/25/13 0456 10/26/13 0624  Weight: 94.847 kg (209 lb 1.6 oz) 91.128 kg (200 lb 14.4 oz) 89.676 kg (197 lb 11.2 oz)     Discharge Diagnoses:  Principal Problem:   Acute systolic congestive heart failure Active Problems:   DIABETES MELLITUS, TYPE II   HYPERTENSION   Dyspnea   CAP (community acquired pneumonia)   Discharge Condition: *stable  Diet recommendation: low sodium < 2.0L of fluid    History of present illness:  53 y.o. year old male with prior history of CAD s/p CABG, ischemic cardiomyopathy w/ EF 10% by ECHO 2013 s/p ICD, IDDM presenting with dyspnea. Patient states that he is at upper respiratory infection symptoms including nasal congestion cough over the past 2-3 weeks. Was initially seen by PCP 2 weeks ago and getting some prescription cough medicine. Patient states his symptoms persisted over the course of 2-3 weeks with worsening dyspnea over the last week. Denies any fevers or chills. Dyspnea as predominantly with exertion. Has also noticed some mild lower extremity swelling and weight gain. Has been compliant of diuretic regimen. No orthopnea or PND. Denies any high salt intake. Has been taking NSAIDs over three week course. Had mild episode of dizziness earlier today. This lasted 1-2 minutes and self resolved. Has had prior episodes of dizziness related to CHF in the past.    Hospital Course:  Acute systolic congestive heart failure:  - Due none-compliance with low sodium diet. Good UOP.  - Continue strict I's and O's, Started on IV lasix gtt, change to  oral, as he has no JVD.  - Home on low sodium diet.  - Continue lisinopril and increase b-blockers  - 101.2->94.8-> 91.1 kg.   CAP (community acquired pneumonia)  - Rocephin and azithromycin 3.17.2015.  - change to doxy 3.19.2015.   DM:  - continue SSI and lantus  - A1C 11.0; Will need close follow up as an outpatient.  - op DM management.  HTN:  - continue current antihypertensive regimen. BP stable   HLD:  - continue statins   CAD:  - no CP. Patient troponin neg  -continue ASA, b-blockers, ACE and statins      Procedures:  CXR  Consultations:  none  Discharge Exam: Filed Vitals:   10/26/13 0624  BP: 103/68  Pulse: 81  Temp: 98.3 F (36.8 C)  Resp: 20    General: A&O x3 Cardiovascular: RRR Respiratory: good air movement CTA B/L  Discharge Instructions      Discharge Orders   Future Appointments Provider Department Dept Phone   11/02/2013 9:00 AM [facility] [place] Office [phone]   Future Orders Complete By Expires   Ambulatory referral to Nutrition and Diabetic Education  As directed    Comments:     A1C 11.0%; needs outpatient diabetes education   Diet - low sodium heart healthy  As directed    Diet - low sodium heart healthy  As directed    Increase activity slowly  As directed    Increase activity slowly  As directed  Medication List    STOP taking these medications       insulin NPH-regular Human (70-30) 100 UNIT/ML injection  Commonly known as:  NOVOLIN 70/30      TAKE these medications       aspirin EC 81 MG tablet  Take 1 tablet (81 mg total) by mouth daily.     atorvastatin 80 MG tablet  Commonly known as:  LIPITOR  Take 1 tablet (80 mg total) by mouth daily.     carvedilol 12.5 MG tablet  Commonly known as:  COREG  Take 1 tablet (12.5 mg total) by mouth 2 (two) times daily with a meal.     doxycycline 100 MG tablet  Commonly known as:  VIBRA-TABS  Take 1 tablet (100 mg total) by  mouth every 12 (twelve) hours.     furosemide 40 MG tablet  Commonly known as:  LASIX  Take 80 mg by mouth 2 (two) times daily.     insulin aspart 100 UNIT/ML injection  Commonly known as:  NOVOLOG  Inject 5 Units into the skin 3 (three) times daily before meals.     Insulin Glargine 100 UNIT/ML Solostar Pen  Commonly known as:  LANTUS  Inject 45 Units into the skin daily at 10 pm.     lisinopril 5 MG tablet  Commonly known as:  PRINIVIL,ZESTRIL  Take 5 mg by mouth 2 (two) times daily.     potassium chloride 20 MEQ packet  Commonly known as:  KLOR-CON  Take 20 mEq by mouth daily.     pregabalin 75 MG capsule  Commonly known as:  LYRICA  Take 75 mg by mouth as needed.     spironolactone 25 MG tablet  Commonly known as:  ALDACTONE  Take 12.5 mg by mouth daily.       No Known Allergies    The results of significant diagnostics from this hospitalization (including imaging, microbiology, ancillary and laboratory) are listed below for reference.    Significant Diagnostic Studies: Dg Chest 2 View  10/21/2013   CLINICAL DATA:  Shortness of breath, infiltrates  EXAM: CHEST  2 VIEW  COMPARISON:  10/19/2013  FINDINGS: Cardiomegaly again noted. Status post CABG. Stable pacemaker position. No pulmonary edema. Persistent streaky density in right upper lobe suspicious for atelectasis or infiltrate.  IMPRESSION: Persistent streaky density in right upper lobe suspicious for atelectasis or infiltrate. Cardiomegaly again noted. Status post CABG. No pulmonary edema.   Electronically Signed   By: Lahoma Crocker M.D.   On: 10/21/2013 08:20   Dg Chest 2 View  10/19/2013   CLINICAL DATA:  Weakness, shortness of breath.  EXAM: CHEST  2 VIEW  COMPARISON:  January 08, 2012.  FINDINGS: Stable cardiomegaly. Status post coronary artery bypass graft. Left-sided pacemaker is unchanged in position. No pneumothorax or pleural effusion is noted. Faint right upper lobe opacity is noted which may represent focal  pneumonia or atelectasis. Bony thorax is intact.  IMPRESSION: Faint right upper lobe opacity is noted concerning for focal atelectasis or possibly early pneumonia.   Electronically Signed   By: Sabino Dick M.D.   On: 10/19/2013 20:53    Microbiology: Recent Results (from the past 240 hour(s))  MRSA PCR SCREENING     Status: None   Collection Time    10/20/13  6:07 AM      Result Value Ref Range Status   MRSA by PCR NEGATIVE  NEGATIVE Final   Comment:  The GeneXpert MRSA Assay (FDA     approved for NASAL specimens     only), is one component of a     comprehensive MRSA colonization     surveillance program. It is not     intended to diagnose MRSA     infection nor to guide or     monitor treatment for     MRSA infections.     Labs: Basic Metabolic Panel:  Recent Labs Lab 10/22/13 0455 10/23/13 0539 10/24/13 1317 10/25/13 0555 10/26/13 0450  NA 138 141 139 139 139  K 4.2 4.1 4.3 3.9 4.7  CL 102 102 97 96 97  CO2 23 24 27 28 27   GLUCOSE 186* 87 154* 107* 145*  BUN 32* 37* 39* 44* 55*  CREATININE 1.10 1.17 1.29 1.23 1.27  CALCIUM 8.8 9.6 9.5 10.0 9.8   Liver Function Tests:  Recent Labs Lab 10/19/13 1903 10/20/13 0614  AST 22 21  ALT 21 20  ALKPHOS 124* 118*  BILITOT 1.3* 1.2  PROT 7.0 7.1  ALBUMIN 2.9* 2.9*   No results found for this basename: LIPASE, AMYLASE,  in the last 168 hours No results found for this basename: AMMONIA,  in the last 168 hours CBC:  Recent Labs Lab 10/19/13 1903 10/19/13 2253 10/20/13 0614 10/21/13 0344  WBC 8.0 8.0 7.2 6.8  NEUTROABS 5.0  --  4.5  --   HGB 9.5* 9.8* 9.4* 9.4*  HCT 30.1* 30.8* 30.0* 30.1*  MCV 82.0 81.9 82.9 83.1  PLT 219 213 205 197   Cardiac Enzymes:  Recent Labs Lab 10/19/13 2349 10/20/13 0614 10/20/13 1055  TROPONINI <0.30 <0.30 <0.30   BNP: BNP (last 3 results)  Recent Labs  10/19/13 1903  PROBNP 1605.0*   CBG:  Recent Labs Lab 10/25/13 0613 10/25/13 1149 10/25/13 1603  10/25/13 2121 10/26/13 0635  GLUCAP 106* 125* 212* 193* 111*       Signed:  Charlynne Cousins  [facility] 10/26/2013, 9:38 AM

## 2013-10-26 NOTE — Discharge Instructions (Signed)
Almon Ogaz was admitted to the Hospital on 10/19/2013 and Discharged on Discharge Date 10/26/2013 and should be excused from work/school   for 8 days starting 10/19/2013 , may return to work/school without any restrictions.  Call Bess Harvest MD, [facility] [phone] with questions.  Charlynne Cousins M.D on 10/26/2013,at 9:29 AM  [facility] Office  [phone]

## 2013-10-26 NOTE — Progress Notes (Signed)
Patient verbalized understanding discharge instructions.

## 2013-10-29 NOTE — Plan of Care (Cosign Needed)
Received a call from the [facility] pharmacy [phone]). Pharmacist has questions regarding the patient's discharge insulin. The patient was questioning the meal coverage insulin as possibly being too low of a dose. As noted at discharge the patient was sent home on Lantus 45 units daily as well as 5 units of NovoLog insulin to be given with meals. Prior to admission patient was on a lower dose of 70/30 insulin and during this hospitalization was changed to Lantus. CBG readings on date of discharge were reviewed and only one reading greater than 200 was observed in a 24-hour period. The patient did not report to the pharmacist as to whether his sugars have been running very high at home therefore I see no indication to increase the short acting insulin dosage at this time. Instructed pharmacist to tell the patient to record his sugars carefully and if he notices his CBGs are increasing to contact his primary care physician office so they can adjust to short acting insulin. Suspect after patient arrives to home his diet intake regarding carbohydrates may not be as strict as they were in the hospital and his sugars likely will increase.  Debbora Lacrosse ANP

## 2013-11-02 ENCOUNTER — Other Ambulatory Visit: Payer: Self-pay | Admitting: *Deleted

## 2013-11-02 ENCOUNTER — Telehealth: Payer: Self-pay | Admitting: Family Medicine

## 2013-11-02 ENCOUNTER — Ambulatory Visit (INDEPENDENT_AMBULATORY_CARE_PROVIDER_SITE_OTHER): Payer: BC Managed Care – PPO | Admitting: *Deleted

## 2013-11-02 ENCOUNTER — Encounter: Payer: Self-pay | Admitting: Internal Medicine

## 2013-11-02 DIAGNOSIS — I2589 Other forms of chronic ischemic heart disease: Secondary | ICD-10-CM

## 2013-11-02 DIAGNOSIS — I255 Ischemic cardiomyopathy: Secondary | ICD-10-CM

## 2013-11-02 DIAGNOSIS — Z95810 Presence of automatic (implantable) cardiac defibrillator: Secondary | ICD-10-CM

## 2013-11-02 DIAGNOSIS — I5022 Chronic systolic (congestive) heart failure: Secondary | ICD-10-CM

## 2013-11-02 LAB — MDC_IDC_ENUM_SESS_TYPE_INCLINIC
Battery Voltage: 3.16 V
Brady Statistic RV Percent Paced: 0 %
HIGH POWER IMPEDANCE MEASURED VALUE: 304 Ohm
HIGH POWER IMPEDANCE MEASURED VALUE: 37 Ohm
HighPow Impedance: 133 Ohm
HighPow Impedance: 31 Ohm
Lead Channel Impedance Value: 342 Ohm
Lead Channel Pacing Threshold Amplitude: 0.625 V
Lead Channel Sensing Intrinsic Amplitude: 9.875 mV
Lead Channel Sensing Intrinsic Amplitude: 9.875 mV
Lead Channel Setting Pacing Amplitude: 2.5 V
Lead Channel Setting Pacing Pulse Width: 0.4 ms
Lead Channel Setting Sensing Sensitivity: 0.3 mV
MDC IDC MSMT LEADCHNL RV PACING THRESHOLD PULSEWIDTH: 0.4 ms
MDC IDC SESS DTM: 20150330092131
MDC IDC SET ZONE DETECTION INTERVAL: 370 ms
Zone Setting Detection Interval: 250 ms
Zone Setting Detection Interval: 300 ms

## 2013-11-02 MED ORDER — DOXYCYCLINE HYCLATE 100 MG PO TABS: 100.0000 mg | ORAL_TABLET | Freq: Two times a day (BID) | ORAL | Status: DC

## 2013-11-02 NOTE — Telephone Encounter (Signed)
Pt called stating he was in the hospital for 1 week and the physician changed his Novolog to 3 times per day.  He wanted Dr. Sarajane Jews to change his Novolog back to the original 2 times per day.  I advised the pt that he needs a post hospital f/u but he denied it.  I tried to explain to him that he needs an OV to evaluate the medication change and especially since he was in the hospital but he refused again.  He stated he will try to contact the physician for the change instead.

## 2013-11-02 NOTE — Progress Notes (Addendum)
ICD check in clinic. Normal device function. Thresholds and sensing consistent with previous device measurements. Impedance trends stable over time. 4 NSVT---longest 25 beats. Histogram distribution appropriate for patient and level of activity. No changes made this session. Device programmed at appropriate safety margins. OptiVol stable today, was up 09/28/13---10/25/13, pt also in hospital during that time. Device programmed to optimize intrinsic conduction. Battery @3 .16V.   ROV w/ Dr. Caryl Comes 02/09/14 @ 3:00

## 2013-11-02 NOTE — Telephone Encounter (Signed)
I cannot give him any medical advice without an OV

## 2013-11-03 NOTE — Telephone Encounter (Signed)
Pt is aware he needs appointment before we make any changes.

## 2013-12-03 ENCOUNTER — Other Ambulatory Visit: Payer: Self-pay | Admitting: Internal Medicine

## 2013-12-25 ENCOUNTER — Telehealth: Payer: Self-pay | Admitting: Family Medicine

## 2013-12-25 MED ORDER — INSULIN PEN NEEDLE 32G X 4 MM MISC: Status: DC

## 2013-12-25 NOTE — Telephone Encounter (Signed)
°  [id number] - [place], [place] - [address] is requesting re-fill on   BD PEN NEEDLE NANO U/F 32G X 4 MM MISC

## 2013-12-25 NOTE — Telephone Encounter (Signed)
I sent script e-scribe. 

## 2014-01-09 ENCOUNTER — Other Ambulatory Visit: Payer: Self-pay | Admitting: Internal Medicine

## 2014-01-11 ENCOUNTER — Telehealth: Payer: Self-pay | Admitting: Family Medicine

## 2014-01-11 NOTE — Telephone Encounter (Signed)
[id number] - [place], [place] - [address] is requesting re-fill on carvedilol (COREG) 12.5 MG tablet

## 2014-01-13 MED ORDER — CARVEDILOL 12.5 MG PO TABS: 12.5000 mg | ORAL_TABLET | Freq: Two times a day (BID) | ORAL | Status: DC

## 2014-01-13 NOTE — Telephone Encounter (Signed)
I sent script e-scribe, okay per Dr. Sarajane Jews to refill.

## 2014-02-09 ENCOUNTER — Encounter: Payer: Self-pay | Admitting: Internal Medicine

## 2014-02-09 ENCOUNTER — Ambulatory Visit (INDEPENDENT_AMBULATORY_CARE_PROVIDER_SITE_OTHER): Payer: BC Managed Care – PPO | Admitting: Internal Medicine

## 2014-02-09 VITALS — BP 141/82 | HR 90 | Ht 70.5 in | Wt 216.0 lb

## 2014-02-09 DIAGNOSIS — I255 Ischemic cardiomyopathy: Secondary | ICD-10-CM

## 2014-02-09 DIAGNOSIS — Z95810 Presence of automatic (implantable) cardiac defibrillator: Secondary | ICD-10-CM

## 2014-02-09 DIAGNOSIS — I5022 Chronic systolic (congestive) heart failure: Secondary | ICD-10-CM

## 2014-02-09 DIAGNOSIS — E119 Type 2 diabetes mellitus without complications: Secondary | ICD-10-CM

## 2014-02-09 DIAGNOSIS — I2589 Other forms of chronic ischemic heart disease: Secondary | ICD-10-CM

## 2014-02-09 DIAGNOSIS — E139 Other specified diabetes mellitus without complications: Secondary | ICD-10-CM

## 2014-02-09 LAB — MDC_IDC_ENUM_SESS_TYPE_INCLINIC
Battery Voltage: 3.14 V
Brady Statistic RV Percent Paced: 0 %
Date Time Interrogation Session: 20150707174629
HIGH POWER IMPEDANCE MEASURED VALUE: 171 Ohm
HighPow Impedance: 323 Ohm
HighPow Impedance: 37 Ohm
HighPow Impedance: 44 Ohm
Lead Channel Impedance Value: 399 Ohm
Lead Channel Pacing Threshold Pulse Width: 0.4 ms
Lead Channel Sensing Intrinsic Amplitude: 12.625 mV
Lead Channel Sensing Intrinsic Amplitude: 17.625 mV
Lead Channel Setting Pacing Amplitude: 2.5 V
Lead Channel Setting Pacing Pulse Width: 0.4 ms
MDC IDC MSMT LEADCHNL RV PACING THRESHOLD AMPLITUDE: 0.625 V
MDC IDC SET LEADCHNL RV SENSING SENSITIVITY: 0.3 mV
MDC IDC SET ZONE DETECTION INTERVAL: 300 ms
Zone Setting Detection Interval: 250 ms
Zone Setting Detection Interval: 370 ms

## 2014-02-09 MED ORDER — LISINOPRIL 5 MG PO TABS: 5.0000 mg | ORAL_TABLET | Freq: Two times a day (BID) | ORAL | Status: DC

## 2014-02-09 MED ORDER — POTASSIUM CHLORIDE 20 MEQ PO PACK: 20.0000 meq | PACK | Freq: Every day | ORAL | Status: DC

## 2014-02-09 MED ORDER — CARVEDILOL 12.5 MG PO TABS: 12.5000 mg | ORAL_TABLET | Freq: Two times a day (BID) | ORAL | Status: DC

## 2014-02-09 MED ORDER — ATORVASTATIN CALCIUM 80 MG PO TABS: 80.0000 mg | ORAL_TABLET | Freq: Every day | ORAL | Status: DC

## 2014-02-09 MED ORDER — SPIRONOLACTONE 25 MG PO TABS: 12.5000 mg | ORAL_TABLET | Freq: Every day | ORAL | Status: DC

## 2014-02-09 MED ORDER — FUROSEMIDE 40 MG PO TABS: 80.0000 mg | ORAL_TABLET | Freq: Two times a day (BID) | ORAL | Status: DC

## 2014-02-09 NOTE — Patient Instructions (Addendum)
Your physician recommends that you continue on your current medications as directed. Please refer to the Current Medication list given to you today.  Your physician recommends that you schedule a follow-up appointment in: 3 months with device clinic.  Your physician wants you to follow-up in: 6 months with Dr. Caryl Comes.  You will receive a reminder letter in the mail two months in advance. If you don't receive a letter, please call our office to schedule the follow-up appointment.  Labs today: BMET, A1C
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
## 2014-02-10 LAB — BASIC METABOLIC PANEL
BUN: 21 mg/dL (ref 6–23)
CO2: 30 meq/L (ref 19–32)
CREATININE: 1 mg/dL (ref 0.4–1.5)
Calcium: 9.7 mg/dL (ref 8.4–10.5)
Chloride: 100 mEq/L (ref 96–112)
GFR: 105.27 mL/min (ref >60.00)
Glucose, Bld: 300 mg/dL — ABNORMAL HIGH (ref 70–99)
Potassium: 3.8 mEq/L (ref 3.5–5.1)
SODIUM: 136 meq/L (ref 135–145)

## 2014-02-10 LAB — HEMOGLOBIN A1C: Hgb A1c MFr Bld: 14 % — ABNORMAL HIGH (ref 4.6–6.5)

## 2014-02-16 ENCOUNTER — Encounter: Payer: Self-pay | Admitting: Family Medicine

## 2014-02-16 ENCOUNTER — Ambulatory Visit (INDEPENDENT_AMBULATORY_CARE_PROVIDER_SITE_OTHER): Payer: BC Managed Care – PPO | Admitting: Family Medicine

## 2014-02-16 VITALS — BP 103/61 | HR 84 | Temp 99.7°F | Ht 70.5 in | Wt 212.0 lb

## 2014-02-16 DIAGNOSIS — I255 Ischemic cardiomyopathy: Secondary | ICD-10-CM

## 2014-02-16 DIAGNOSIS — I10 Essential (primary) hypertension: Secondary | ICD-10-CM

## 2014-02-16 DIAGNOSIS — I2589 Other forms of chronic ischemic heart disease: Secondary | ICD-10-CM

## 2014-02-16 DIAGNOSIS — E785 Hyperlipidemia, unspecified: Secondary | ICD-10-CM

## 2014-02-16 DIAGNOSIS — I5022 Chronic systolic (congestive) heart failure: Secondary | ICD-10-CM

## 2014-02-16 DIAGNOSIS — E119 Type 2 diabetes mellitus without complications: Secondary | ICD-10-CM

## 2014-02-16 MED ORDER — INSULIN GLARGINE 100 UNIT/ML SOLOSTAR PEN: 60.0000 [IU] | PEN_INJECTOR | Freq: Every day | SUBCUTANEOUS | Status: DC

## 2014-02-16 MED ORDER — INSULIN ASPART 100 UNIT/ML FLEXPEN: 10.0000 [IU] | PEN_INJECTOR | Freq: Three times a day (TID) | SUBCUTANEOUS | Status: DC

## 2014-02-16 NOTE — Progress Notes (Signed)
Pre visit review using our clinic review tool, if applicable. No additional management support is needed unless otherwise documented below in the visit note. 

## 2014-02-16 NOTE — Progress Notes (Signed)
   Subjective:    Patient ID: Marcus Johnson, male    DOB: 1961/04/05, 53 y.o.   MRN: [id number]  HPI Here for follow up. He saw Dr. Caryl Comes a week ago and he seems to be doing quite well from a cardiac perspective. His BP [place] stable. However his diabetes is poorly controlled. His A1c that day was 14.0 depsite him watching his diet closely and taking his meds regularly. His creatinine and GFR were excellent.    Review of Systems  Constitutional: Negative.   Respiratory: Negative.   Cardiovascular: Negative.        Objective:   Physical Exam  Constitutional: He appears well-developed and well-nourished.  Neck: No thyromegaly present.  Cardiovascular: Normal rate, regular rhythm, normal heart sounds and intact distal pulses.   Pulmonary/Chest: Effort normal and breath sounds normal.  Lymphadenopathy:    He has no cervical adenopathy.          Assessment & Plan:  We will increase his Lantus to 45 units qhs and increase his Novolog to 10 units tid. Refer to Endocrine for further management.

## 2014-02-18 ENCOUNTER — Telehealth: Payer: Self-pay | Admitting: Family Medicine

## 2014-02-18 NOTE — Telephone Encounter (Signed)
Pt needs a rx of [employer] 2 test strip.  He states that the only kind of meter takes.  [facility] [address]

## 2014-02-19 MED ORDER — BLOOD GLUCOSE TEST VI STRP: ORAL_STRIP | Status: DC

## 2014-02-19 NOTE — Telephone Encounter (Signed)
I sent script e-scribe. 

## 2014-03-01 ENCOUNTER — Other Ambulatory Visit: Payer: Self-pay | Admitting: Family Medicine

## 2014-03-01 DIAGNOSIS — E119 Type 2 diabetes mellitus without complications: Secondary | ICD-10-CM

## 2014-03-03 ENCOUNTER — Ambulatory Visit: Payer: BC Managed Care – PPO | Admitting: Internal Medicine

## 2014-03-24 ENCOUNTER — Other Ambulatory Visit (INDEPENDENT_AMBULATORY_CARE_PROVIDER_SITE_OTHER): Payer: BC Managed Care – PPO

## 2014-03-24 DIAGNOSIS — E119 Type 2 diabetes mellitus without complications: Secondary | ICD-10-CM

## 2014-03-24 LAB — LIPID PANEL
CHOLESTEROL: 137 mg/dL (ref 0–200)
HDL: 27.2 mg/dL — ABNORMAL LOW (ref >39.00)
LDL CALC: 88 mg/dL (ref 0–99)
NonHDL: 109.8
TRIGLYCERIDES: 111 mg/dL (ref 0.0–149.0)
Total CHOL/HDL Ratio: 5
VLDL: 22.2 mg/dL (ref 0.0–40.0)

## 2014-04-08 ENCOUNTER — Encounter: Payer: Self-pay | Admitting: Internal Medicine

## 2014-04-08 NOTE — Telephone Encounter (Signed)
Follow Up   Pt requests a call back to discuss lab results

## 2014-04-08 NOTE — Telephone Encounter (Signed)
This encounter was created in error - please disregard.

## 2014-04-08 NOTE — Telephone Encounter (Signed)
°  Patient would like lab results, please call and advise.

## 2014-05-20 ENCOUNTER — Ambulatory Visit (INDEPENDENT_AMBULATORY_CARE_PROVIDER_SITE_OTHER): Payer: BC Managed Care – PPO | Admitting: *Deleted

## 2014-05-20 ENCOUNTER — Encounter: Payer: Self-pay | Admitting: Internal Medicine

## 2014-05-20 DIAGNOSIS — I5021 Acute systolic (congestive) heart failure: Secondary | ICD-10-CM

## 2014-05-20 DIAGNOSIS — I255 Ischemic cardiomyopathy: Secondary | ICD-10-CM

## 2014-05-20 LAB — MDC_IDC_ENUM_SESS_TYPE_INCLINIC
Brady Statistic RV Percent Paced: 0.01 %
Date Time Interrogation Session: 20151015195417
HIGH POWER IMPEDANCE MEASURED VALUE: 32 Ohm
HIGH POWER IMPEDANCE MEASURED VALUE: 40 Ohm
HighPow Impedance: 133 Ohm
HighPow Impedance: 304 Ohm
Lead Channel Impedance Value: 342 Ohm
Lead Channel Pacing Threshold Amplitude: 0.75 V
Lead Channel Pacing Threshold Pulse Width: 0.4 ms
Lead Channel Sensing Intrinsic Amplitude: 11.125 mV
Lead Channel Sensing Intrinsic Amplitude: 11.875 mV
Lead Channel Setting Pacing Amplitude: 2.5 V
Lead Channel Setting Pacing Pulse Width: 0.4 ms
MDC IDC MSMT BATTERY VOLTAGE: 3.14 V
MDC IDC SET LEADCHNL RV SENSING SENSITIVITY: 0.3 mV
MDC IDC SET ZONE DETECTION INTERVAL: 250 ms
MDC IDC SET ZONE DETECTION INTERVAL: 370 ms
Zone Setting Detection Interval: 300 ms

## 2014-05-20 NOTE — Progress Notes (Signed)
ICD check in clinic. Normal device function. Threshold and sensing consistent with previous device measurements. Impedance trends stable over time. 6 NSVT episodes recorded---max dur. 15 bts, Max Avg V 229.  Histogram distribution appropriate for patient and level of activity. No changes made this session. Device programmed at appropriate safety margins. Device programmed to optimize intrinsic conduction. Batt voltage 3.14V (ERI 2.63V). Plan to follow up with [facility] in 3 months. Patient education completed including shock plan. Alert tones demonstrated for patient.

## 2014-06-08 ENCOUNTER — Telehealth: Payer: Self-pay | Admitting: Family Medicine

## 2014-06-08 NOTE — Telephone Encounter (Signed)
Patient Information:  Caller Name: [place]  Phone: [phone]  Patient: Marcus Johnson, Marcus Johnson  Gender: Male  DOB: 05/11/1961  Age: 53 Years  PCP: Alysia Penna [facility])  Office Follow Up:  Does the office need to follow up with this patient?: No  Instructions For The Office: N/A  RN Note:  Pt requested appointment with Dr. Sarajane Jews and declined available appointment for 06/08/2014.  Symptoms  Reason For Call & Symptoms: Onset 4 days of "not feeling well" and fatigue with cold-like sxs.  Weight 1 week ago was 224 and 235 on 06/08/2014.  Reviewed Health History In EMR: Yes  Reviewed Medications In EMR: Yes  Reviewed Allergies In EMR: Yes  Reviewed Surgeries / Procedures: Yes  Date of Onset of Symptoms: 06/04/2014  Treatments Tried: Alka-Seltzer Cold Plus  Treatments Tried Worked: No  Guideline(s) Used:  Weakness (Generalized) and Fatigue  Disposition Per Guideline:   See Today in Office  Reason For Disposition Reached:   Moderate weakness (i.e., interferes with work, school, normal activities) and persists > 3 days  Advice Given:  Reassurance  Weakness often accompanies viral illnesses (e.g., colds and flu).  The weakness is usually worse the first 3 days of the illness, then gets better.  A fever can make you feel weak.  Here is some care advice that should help.  Fever Medicines:  For fevers above 101 F (38.3 C) take either acetaminophen or ibuprofen.  They are over-the-counter (OTC) drugs that help treat both fever and pain. You can buy them at the drugstore.  The goal of fever therapy is to bring the fever down to a comfortable level. Remember that fever medicine usually lowers fever 2 degrees F (1 - 1 1/2 degrees C).  Acetaminophen (e.g., Tylenol):  Regular Strength Tylenol: Take 650 mg (two 325 mg pills) by mouth every 4-6 hours as needed. Each Regular Strength Tylenol pill has 325 mg of acetaminophen.  Extra Strength Tylenol: Take 1,000 mg (two 500 mg pills) every 8  hours as needed. Each Extra Strength Tylenol pill has 500 mg of acetaminophen.  The most you should take each day is 3,000 mg (10 Regular Strength or 6 Extra Strength pills a day).  Extra Notes :  Use the lowest amount of medicine that makes your fever better.  Acetaminophen is thought to be safer than ibuprofen or naproxen in people over 66 years old. Acetaminophen is in many OTC and prescription medicines. It might be in more than one medicine that you are taking. You need to be careful and not take an overdose. An acetaminophen overdose can hurt the liver.  [place] that makes Tylenol, has different dosage instructions for Tylenol in [place] and the [place]. In [place], the maximum recommended dose per day is 4,000 mg or twelve Regular-Strength (325 mg) pills. In the [place], [place] recommends a maximum dose of ten Regular-Strength (325 mg) pills.  Caution: Do not take acetaminophen if you have liver disease.  Caution: Do not take ibuprofen if you have stomach problems, kidney disease, are pregnant, or have been told by your doctor to avoid this type of anti-inflammatory drug. Do not take ibuprofen for more than 7 days without consulting your doctor.  Before taking any medicine, read all the instructions on the package.  For All Fevers:  Drink cold fluids orally to prevent dehydration (Reason: good hydration replaces sweat and improves heat loss via skin). Adults should drink 6-8 glasses of water daily.  Dress in one layer of  lightweight clothing and sleep with one light blanket.  For fevers 100-101 F (37.8-38.3 C) this is the only treatment and fever medicine is unnecessary.  Call Back If:  Unable to stand or walk  Passes out  Breathing difficulty occurs  You become worse.  Patient Refused Recommendation:  Patient Refused Appt, Patient Requests Appt At Later Date  Pt requested appointment with Dr. Sarajane Jews and declined available appointment for 06/08/2014.

## 2014-06-08 NOTE — Telephone Encounter (Signed)
Noted. FYI 

## 2014-06-09 ENCOUNTER — Encounter: Payer: Self-pay | Admitting: Family Medicine

## 2014-06-09 ENCOUNTER — Ambulatory Visit (INDEPENDENT_AMBULATORY_CARE_PROVIDER_SITE_OTHER): Payer: BC Managed Care – PPO | Admitting: Family Medicine

## 2014-06-09 VITALS — BP 114/73 | HR 89 | Temp 99.2°F | Ht 70.5 in | Wt 235.0 lb

## 2014-06-09 DIAGNOSIS — I5022 Chronic systolic (congestive) heart failure: Secondary | ICD-10-CM

## 2014-06-09 DIAGNOSIS — E119 Type 2 diabetes mellitus without complications: Secondary | ICD-10-CM

## 2014-06-09 LAB — BASIC METABOLIC PANEL
BUN: 24 mg/dL — AB (ref 6–23)
CO2: 24 meq/L (ref 19–32)
Calcium: 9 mg/dL (ref 8.4–10.5)
Chloride: 103 mEq/L (ref 96–112)
Creatinine, Ser: 1.2 mg/dL (ref 0.4–1.5)
GFR: 82.06 mL/min (ref >60.00)
Glucose, Bld: 86 mg/dL (ref 70–99)
Potassium: 3.5 mEq/L (ref 3.5–5.1)
Sodium: 139 mEq/L (ref 135–145)

## 2014-06-09 LAB — HEMOGLOBIN A1C: Hgb A1c MFr Bld: 7.7 % — ABNORMAL HIGH (ref 4.6–6.5)

## 2014-06-09 MED ORDER — POTASSIUM CHLORIDE ER 10 MEQ PO TBCR: 20.0000 meq | EXTENDED_RELEASE_TABLET | Freq: Every day | ORAL | Status: DC

## 2014-06-09 MED ORDER — SPIRONOLACTONE 25 MG PO TABS: 25.0000 mg | ORAL_TABLET | Freq: Every day | ORAL | Status: DC

## 2014-06-09 MED ORDER — FUROSEMIDE 40 MG PO TABS: 80.0000 mg | ORAL_TABLET | Freq: Two times a day (BID) | ORAL | Status: DC

## 2014-06-09 NOTE — Progress Notes (Signed)
Pre visit review using our clinic review tool, if applicable. No additional management support is needed unless otherwise documented below in the visit note. 

## 2014-06-09 NOTE — Progress Notes (Signed)
   Subjective:    Patient ID: Marcus Johnson, male    DOB: September 25, 1960, 53 y.o.   MRN: [id number]  HPI Here for fatigue and fluid buildup. He has been taking a total of 80 mg of Lasix bid and 12.5 mg of spironolactone daily. His legs have been swelling but he denies SOB. His glucoses have been under better control lately, running from 120 to 160.    Review of Systems  Constitutional: Positive for fatigue.  HENT: Negative.   Respiratory: Negative.   Cardiovascular: Positive for leg swelling. Negative for chest pain and palpitations.       Objective:   Physical Exam  Constitutional: He appears well-developed and well-nourished.  Cardiovascular: Normal rate, regular rhythm, normal heart sounds and intact distal pulses.   Pulmonary/Chest: Effort normal and breath sounds normal.  Musculoskeletal:  3+ edema in both lower legs           Assessment & Plan:  Get labs today. Increase spironolactone to 25 mg daily.

## 2014-06-16 NOTE — Addendum Note (Signed)
Addended by: Aggie Hacker A on: 06/16/2014 11:13 AM   Modules accepted: Medications

## 2014-07-15 ENCOUNTER — Encounter (HOSPITAL_COMMUNITY): Payer: Self-pay | Admitting: Cardiovascular Disease

## 2014-07-19 ENCOUNTER — Other Ambulatory Visit: Payer: Self-pay

## 2014-07-19 ENCOUNTER — Encounter (HOSPITAL_COMMUNITY): Payer: Self-pay | Admitting: Nurse Practitioner

## 2014-07-19 ENCOUNTER — Inpatient Hospital Stay (HOSPITAL_COMMUNITY)
Admission: EM | Admit: 2014-07-19 | Discharge: 2014-08-02 | DRG: 292 | Disposition: A | Discharge status: Home / Self Care | Payer: BC Managed Care – PPO | Attending: Internal Medicine | Admitting: Internal Medicine

## 2014-07-19 ENCOUNTER — Emergency Department (HOSPITAL_COMMUNITY): Payer: BC Managed Care – PPO

## 2014-07-19 ENCOUNTER — Telehealth: Payer: Self-pay | Admitting: Family Medicine

## 2014-07-19 DIAGNOSIS — Z8249 Family history of ischemic heart disease and other diseases of the circulatory system: Secondary | ICD-10-CM

## 2014-07-19 DIAGNOSIS — I255 Ischemic cardiomyopathy: Secondary | ICD-10-CM | POA: Diagnosis present

## 2014-07-19 DIAGNOSIS — R0602 Shortness of breath: Secondary | ICD-10-CM

## 2014-07-19 DIAGNOSIS — I10 Essential (primary) hypertension: Secondary | ICD-10-CM | POA: Diagnosis present

## 2014-07-19 DIAGNOSIS — I5021 Acute systolic (congestive) heart failure: Secondary | ICD-10-CM | POA: Diagnosis present

## 2014-07-19 DIAGNOSIS — Z79899 Other long term (current) drug therapy: Secondary | ICD-10-CM

## 2014-07-19 DIAGNOSIS — E1129 Type 2 diabetes mellitus with other diabetic kidney complication: Secondary | ICD-10-CM | POA: Diagnosis present

## 2014-07-19 DIAGNOSIS — Z7982 Long term (current) use of aspirin: Secondary | ICD-10-CM

## 2014-07-19 DIAGNOSIS — I5023 Acute on chronic systolic (congestive) heart failure: Principal | ICD-10-CM | POA: Diagnosis present

## 2014-07-19 DIAGNOSIS — N058 Unspecified nephritic syndrome with other morphologic changes: Secondary | ICD-10-CM

## 2014-07-19 DIAGNOSIS — Z95810 Presence of automatic (implantable) cardiac defibrillator: Secondary | ICD-10-CM

## 2014-07-19 DIAGNOSIS — I2510 Atherosclerotic heart disease of native coronary artery without angina pectoris: Secondary | ICD-10-CM | POA: Insufficient documentation

## 2014-07-19 DIAGNOSIS — Z833 Family history of diabetes mellitus: Secondary | ICD-10-CM

## 2014-07-19 DIAGNOSIS — E669 Obesity, unspecified: Secondary | ICD-10-CM | POA: Diagnosis present

## 2014-07-19 DIAGNOSIS — Z794 Long term (current) use of insulin: Secondary | ICD-10-CM

## 2014-07-19 DIAGNOSIS — Z951 Presence of aortocoronary bypass graft: Secondary | ICD-10-CM

## 2014-07-19 DIAGNOSIS — R0600 Dyspnea, unspecified: Secondary | ICD-10-CM | POA: Diagnosis present

## 2014-07-19 DIAGNOSIS — R52 Pain, unspecified: Secondary | ICD-10-CM

## 2014-07-19 DIAGNOSIS — Z6834 Body mass index (BMI) 34.0-34.9, adult: Secondary | ICD-10-CM

## 2014-07-19 DIAGNOSIS — N179 Acute kidney failure, unspecified: Secondary | ICD-10-CM | POA: Diagnosis not present

## 2014-07-19 DIAGNOSIS — Z23 Encounter for immunization: Secondary | ICD-10-CM

## 2014-07-19 DIAGNOSIS — R600 Localized edema: Secondary | ICD-10-CM | POA: Diagnosis present

## 2014-07-19 LAB — BASIC METABOLIC PANEL
Anion gap: 15 (ref 5–15)
BUN: 26 mg/dL — AB (ref 6–23)
CO2: 24 meq/L (ref 19–32)
CREATININE: 1.05 mg/dL (ref 0.50–1.35)
Calcium: 9.2 mg/dL (ref 8.4–10.5)
Chloride: 101 mEq/L (ref 96–112)
GFR calc non Af Amer: 79 mL/min — ABNORMAL LOW (ref >90)
Glucose, Bld: 138 mg/dL — ABNORMAL HIGH (ref 70–99)
Potassium: 4.2 mEq/L (ref 3.7–5.3)
Sodium: 140 mEq/L (ref 137–147)

## 2014-07-19 LAB — TROPONIN I

## 2014-07-19 LAB — I-STAT TROPONIN, ED: Troponin i, poc: 0.04 ng/mL (ref 0.00–0.08)

## 2014-07-19 LAB — URINALYSIS, ROUTINE W REFLEX MICROSCOPIC
BILIRUBIN URINE: NEGATIVE
GLUCOSE, UA: NEGATIVE mg/dL
KETONES UR: NEGATIVE mg/dL
Leukocytes, UA: NEGATIVE
Nitrite: NEGATIVE
PROTEIN: 30 mg/dL — AB
Specific Gravity, Urine: 1.009 (ref 1.005–1.030)
Urobilinogen, UA: 4 mg/dL — ABNORMAL HIGH (ref 0.0–1.0)
pH: 6.5 (ref 5.0–8.0)

## 2014-07-19 LAB — TSH: TSH: 2.71 u[IU]/mL (ref 0.350–4.500)

## 2014-07-19 LAB — GLUCOSE, CAPILLARY: Glucose-Capillary: 136 mg/dL — ABNORMAL HIGH (ref 70–99)

## 2014-07-19 LAB — CBC
HEMATOCRIT: 38.7 % — AB (ref 39.0–52.0)
Hemoglobin: 12.2 g/dL — ABNORMAL LOW (ref 13.0–17.0)
MCH: 24.7 pg — ABNORMAL LOW (ref 26.0–34.0)
MCHC: 31.5 g/dL (ref 30.0–36.0)
MCV: 78.3 fL (ref 78.0–100.0)
Platelets: 221 10*3/uL (ref 150–400)
RBC: 4.94 MIL/uL (ref 4.22–5.81)
RDW: 16.1 % — AB (ref 11.5–15.5)
WBC: 8.4 10*3/uL (ref 4.0–10.5)

## 2014-07-19 LAB — URINE MICROSCOPIC-ADD ON

## 2014-07-19 LAB — CBG MONITORING, ED: Glucose-Capillary: 109 mg/dL — ABNORMAL HIGH (ref 70–99)

## 2014-07-19 LAB — PRO B NATRIURETIC PEPTIDE: Pro B Natriuretic peptide (BNP): 730.5 pg/mL — ABNORMAL HIGH (ref 0–125)

## 2014-07-19 LAB — MRSA PCR SCREENING: MRSA BY PCR: NEGATIVE

## 2014-07-19 MED ORDER — INSULIN GLARGINE 100 UNIT/ML SOLOSTAR PEN: 60.0000 [IU] | PEN_INJECTOR | Freq: Every day | SUBCUTANEOUS | Status: DC

## 2014-07-19 MED ORDER — ACETAMINOPHEN 325 MG PO TABS: 650.0000 mg | ORAL_TABLET | ORAL | Status: DC | PRN

## 2014-07-19 MED ORDER — INFLUENZA VAC SPLIT QUAD 0.5 ML IM SUSY
0.5000 mL | PREFILLED_SYRINGE | INTRAMUSCULAR | Status: AC
  Administered 2014-07-26: 0.5 mL via INTRAMUSCULAR
  Filled 2014-07-19 (×2): qty 0.5

## 2014-07-19 MED ORDER — CARVEDILOL 12.5 MG PO TABS
12.5000 mg | ORAL_TABLET | Freq: Two times a day (BID) | ORAL | Status: DC
  Administered 2014-07-20 – 2014-07-26 (×13): 12.5 mg via ORAL
  Filled 2014-07-19 (×15): qty 1

## 2014-07-19 MED ORDER — INSULIN ASPART 100 UNIT/ML ~~LOC~~ SOLN
0.0000 [IU] | Freq: Every day | SUBCUTANEOUS | Status: DC
  Administered 2014-07-20 – 2014-07-24 (×2): 2 [IU] via SUBCUTANEOUS

## 2014-07-19 MED ORDER — ONDANSETRON HCL 4 MG/2ML IJ SOLN: 4.0000 mg | Freq: Four times a day (QID) | INTRAMUSCULAR | Status: DC | PRN

## 2014-07-19 MED ORDER — ATORVASTATIN CALCIUM 80 MG PO TABS
80.0000 mg | ORAL_TABLET | Freq: Every day | ORAL | Status: DC
  Administered 2014-07-20 – 2014-08-02 (×14): 80 mg via ORAL
  Filled 2014-07-19 (×14): qty 1

## 2014-07-19 MED ORDER — ASPIRIN EC 81 MG PO TBEC
81.0000 mg | DELAYED_RELEASE_TABLET | Freq: Every day | ORAL | Status: DC
  Administered 2014-07-20 – 2014-07-25 (×6): 81 mg via ORAL
  Filled 2014-07-19 (×6): qty 1

## 2014-07-19 MED ORDER — INSULIN GLARGINE 100 UNIT/ML ~~LOC~~ SOLN
60.0000 [IU] | Freq: Every day | SUBCUTANEOUS | Status: DC
  Administered 2014-07-20 – 2014-08-01 (×12): 60 [IU] via SUBCUTANEOUS
  Filled 2014-07-19 (×16): qty 0.6

## 2014-07-19 MED ORDER — FUROSEMIDE 10 MG/ML IJ SOLN
60.0000 mg | Freq: Two times a day (BID) | INTRAMUSCULAR | Status: DC
  Administered 2014-07-20 – 2014-07-21 (×3): 60 mg via INTRAVENOUS
  Filled 2014-07-19 (×5): qty 6

## 2014-07-19 MED ORDER — POTASSIUM CHLORIDE ER 10 MEQ PO TBCR
20.0000 meq | EXTENDED_RELEASE_TABLET | Freq: Every day | ORAL | Status: DC
  Administered 2014-07-20 – 2014-07-21 (×2): 20 meq via ORAL
  Filled 2014-07-19 (×2): qty 2

## 2014-07-19 MED ORDER — SODIUM CHLORIDE 0.9 % IJ SOLN
3.0000 mL | Freq: Two times a day (BID) | INTRAMUSCULAR | Status: DC
Start: 2014-07-19 — End: 2014-08-02
  Administered 2014-07-19 – 2014-08-01 (×14): 3 mL via INTRAVENOUS

## 2014-07-19 MED ORDER — FUROSEMIDE 10 MG/ML IJ SOLN
60.0000 mg | Freq: Once | INTRAMUSCULAR | Status: AC
  Administered 2014-07-19: 60 mg via INTRAVENOUS
  Filled 2014-07-19: qty 6

## 2014-07-19 MED ORDER — INSULIN ASPART 100 UNIT/ML ~~LOC~~ SOLN
0.0000 [IU] | Freq: Three times a day (TID) | SUBCUTANEOUS | Status: DC
  Administered 2014-07-20 (×2): 3 [IU] via SUBCUTANEOUS
  Administered 2014-07-20: 2 [IU] via SUBCUTANEOUS
  Administered 2014-07-21 (×2): 3 [IU] via SUBCUTANEOUS
  Administered 2014-07-22 – 2014-07-23 (×3): 2 [IU] via SUBCUTANEOUS
  Administered 2014-07-23: 3 [IU] via SUBCUTANEOUS
  Administered 2014-07-24 – 2014-07-30 (×7): 2 [IU] via SUBCUTANEOUS

## 2014-07-19 MED ORDER — LISINOPRIL 5 MG PO TABS
5.0000 mg | ORAL_TABLET | Freq: Two times a day (BID) | ORAL | Status: DC
  Administered 2014-07-19 – 2014-08-02 (×28): 5 mg via ORAL
  Filled 2014-07-19 (×30): qty 1

## 2014-07-19 MED ORDER — SODIUM CHLORIDE 0.9 % IV SOLN
250.0000 mL | INTRAVENOUS | Status: DC | PRN
  Administered 2014-07-26: 250 mL via INTRAVENOUS

## 2014-07-19 MED ORDER — SODIUM CHLORIDE 0.9 % IJ SOLN: 3.0000 mL | INTRAMUSCULAR | Status: DC | PRN

## 2014-07-19 MED ORDER — ENOXAPARIN SODIUM 40 MG/0.4ML ~~LOC~~ SOLN
40.0000 mg | SUBCUTANEOUS | Status: DC
  Administered 2014-07-19 – 2014-08-01 (×14): 40 mg via SUBCUTANEOUS
  Filled 2014-07-19 (×15): qty 0.4

## 2014-07-19 MED ORDER — FUROSEMIDE 10 MG/ML IJ SOLN: 60.0000 mg | Freq: Two times a day (BID) | INTRAMUSCULAR | Status: DC

## 2014-07-19 NOTE — Progress Notes (Signed)
Report received from the ED at [facility] and pt arrived to the unit at 1900. Pt A&O x4; pt placed on telemetry called in to [facility]; pt wanted to the use the BR prior to getting vitals; pt ambulated to the BR with standby supervision; pt denies any pain; pt voided 230ml in urinal. Pt assisted back to the chair and oriented to the room and unit; call light within reach. Reporting off to incoming RN. Francis Gaines [place] RN.

## 2014-07-19 NOTE — ED Notes (Signed)
He reports abd and leg swelling for past month, he is concerned because he has been taking lasix as prescribed. hes had mild SOB. He c/o moderate pain in legs, 5/10. He is able to speak in full unlabored sentences in triage.
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
## 2014-07-19 NOTE — ED Notes (Signed)
Pt states he feels his sugar is dropping. CBG obtained and reads 109

## 2014-07-19 NOTE — ED Notes (Signed)
Pt ambulated with assistance for pulse oximetry test. Pt pulse rate initially 89 and oxygen levels were 100% on room air. After walking roughly 75 yards. Pt oxygen levels decreased to 96% and pulse increased to 100. Pt denies any shortness of breath however states he feels dizzy

## 2014-07-19 NOTE — Telephone Encounter (Signed)
PLEASE NOTE: All timestamps contained within this report are represented as [place] Standard Time. CONFIDENTIALTY NOTICE: This fax transmission is intended only for the addressee. It contains information that is legally privileged, confidential or otherwise protected from use or disclosure. If you are not the intended recipient, you are strictly prohibited from reviewing, disclosing, copying using or disseminating any of this information or taking any action in reliance on or regarding this information. If you have received this fax in error, please notify us immediately by telephone so that we can arrange for its return to [place]. Phone: [phone], Toll-Free: [phone], Fax: [phone] Page: 1 of 2 Call Id: [id number] [facility] - Client [place] Patient Name: Marcus Johnson Gender: Male DOB: October 02, 1960 Age: 53 Y 42 M 4 D Return Phone Number: [phone] (Primary) Address: [place] City/State/Zip: [place] [address] Client [facility] - Client Client Site [facility] - Day Physician Alysia Penna Contact Type Call Call Type Triage / Clinical Relationship To Patient Self Return Phone Number [phone] (Primary) Chief Complaint Leg Swelling And Edema Initial Comment Caller states he has a lot of swelling in legs around knee area. PreDisposition Call Doctor Nurse Assessment Nurse: Raphael Gibney, RN, Vanita Ingles Date/Time Eilene Ghazi Time): 07/19/2014 1:28:19 PM Confirm and document reason for call. If symptomatic, describe symptoms. ---Caller states he has a lot of swelling in both legs from his feet to above his knees. He has SOB with exertion. Hurts to bend his legs. Doctor recently changed his lasix Has the patient traveled out of the country within the last 30 days? ---No Does the patient require triage? ---Yes Related visit to physician within the last 2 weeks?  ---No Does the PT have any chronic conditions? (i.e. diabetes, asthma, etc.) ---Yes List chronic conditions. ---CABG; defibrillator. Guidelines Guideline Title Affirmed Question Affirmed Notes Nurse Date/Time Eilene Ghazi Time) Leg Swelling and Edema [1] Difficulty breathing with exertion (e.g., walking) AND [2] new onset or worsening Raphael Gibney, RN, Vanita Ingles 07/19/2014 1:32:28 PM Disp. Time Eilene Ghazi Time) Disposition Final User 07/19/2014 1:40:13 PM Go to ED Now (or PCP triage) Yes Raphael Gibney, RN, Doreatha Lew Understands: Yes Disagree/Comply: Comply PLEASE NOTE: All timestamps contained within this report are represented as [place] Standard Time. CONFIDENTIALTY NOTICE: This fax transmission is intended only for the addressee. It contains information that is legally privileged, confidential or otherwise protected from use or disclosure. If you are not the intended recipient, you are strictly prohibited from reviewing, disclosing, copying using or disseminating any of this information or taking any action in reliance on or regarding this information. If you have received this fax in error, please notify us immediately by telephone so that we can arrange for its return to [place]. Phone: [phone], Toll-Free: [phone], Fax: [phone] Page: 2 of 2 Call Id: [id number] Care Advice Given Per Guideline GO TO ED NOW (OR PCP TRIAGE): * IF NO PCP TRIAGE: You need to be seen. [facility] to [facility] at [facility] within the next hour. Leave as soon as you can. CARE ADVICE given per Leg Swelling and Edema (Adult) guideline. DRIVING: Another adult should drive. After Care Instructions Given Call Event Type User Date / Time Description Comments User: Dannielle Burn, RN Date/Time Eilene Ghazi Time): 07/19/2014 1:41:54 PM Called back line at office and Dr. Sarajane Jews does not have any appts and instruct pt to go to the ER. Pt notified and verbalized understanding. Referrals [facility] -  ED
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
## 2014-07-19 NOTE — ED Notes (Signed)
Patient transported to X-ray 

## 2014-07-20 DIAGNOSIS — R0602 Shortness of breath: Secondary | ICD-10-CM | POA: Diagnosis present

## 2014-07-20 DIAGNOSIS — I2510 Atherosclerotic heart disease of native coronary artery without angina pectoris: Secondary | ICD-10-CM | POA: Diagnosis present

## 2014-07-20 DIAGNOSIS — Z6834 Body mass index (BMI) 34.0-34.9, adult: Secondary | ICD-10-CM | POA: Diagnosis not present

## 2014-07-20 DIAGNOSIS — E669 Obesity, unspecified: Secondary | ICD-10-CM | POA: Diagnosis present

## 2014-07-20 DIAGNOSIS — Z7982 Long term (current) use of aspirin: Secondary | ICD-10-CM | POA: Diagnosis not present

## 2014-07-20 DIAGNOSIS — I255 Ischemic cardiomyopathy: Secondary | ICD-10-CM | POA: Diagnosis present

## 2014-07-20 DIAGNOSIS — N179 Acute kidney failure, unspecified: Secondary | ICD-10-CM | POA: Diagnosis not present

## 2014-07-20 DIAGNOSIS — I369 Nonrheumatic tricuspid valve disorder, unspecified: Secondary | ICD-10-CM

## 2014-07-20 DIAGNOSIS — Z23 Encounter for immunization: Secondary | ICD-10-CM | POA: Diagnosis not present

## 2014-07-20 DIAGNOSIS — Z794 Long term (current) use of insulin: Secondary | ICD-10-CM | POA: Diagnosis not present

## 2014-07-20 DIAGNOSIS — Z951 Presence of aortocoronary bypass graft: Secondary | ICD-10-CM | POA: Diagnosis not present

## 2014-07-20 DIAGNOSIS — Z8249 Family history of ischemic heart disease and other diseases of the circulatory system: Secondary | ICD-10-CM | POA: Diagnosis not present

## 2014-07-20 DIAGNOSIS — E1129 Type 2 diabetes mellitus with other diabetic kidney complication: Secondary | ICD-10-CM | POA: Diagnosis present

## 2014-07-20 DIAGNOSIS — I10 Essential (primary) hypertension: Secondary | ICD-10-CM | POA: Diagnosis present

## 2014-07-20 DIAGNOSIS — Z833 Family history of diabetes mellitus: Secondary | ICD-10-CM | POA: Diagnosis not present

## 2014-07-20 DIAGNOSIS — I5023 Acute on chronic systolic (congestive) heart failure: Secondary | ICD-10-CM | POA: Diagnosis present

## 2014-07-20 DIAGNOSIS — Z79899 Other long term (current) drug therapy: Secondary | ICD-10-CM | POA: Diagnosis not present

## 2014-07-20 DIAGNOSIS — Z95810 Presence of automatic (implantable) cardiac defibrillator: Secondary | ICD-10-CM | POA: Diagnosis not present

## 2014-07-20 LAB — GLUCOSE, CAPILLARY
GLUCOSE-CAPILLARY: 158 mg/dL — AB (ref 70–99)
GLUCOSE-CAPILLARY: 171 mg/dL — AB (ref 70–99)
GLUCOSE-CAPILLARY: 215 mg/dL — AB (ref 70–99)
Glucose-Capillary: 148 mg/dL — ABNORMAL HIGH (ref 70–99)

## 2014-07-20 LAB — COMPREHENSIVE METABOLIC PANEL
ALBUMIN: 2.9 g/dL — AB (ref 3.5–5.2)
ALT: 29 U/L (ref 0–53)
ANION GAP: 14 (ref 5–15)
AST: 43 U/L — AB (ref 0–37)
Alkaline Phosphatase: 216 U/L — ABNORMAL HIGH (ref 39–117)
BUN: 28 mg/dL — AB (ref 6–23)
CO2: 22 mEq/L (ref 19–32)
CREATININE: 0.97 mg/dL (ref 0.50–1.35)
Calcium: 9.1 mg/dL (ref 8.4–10.5)
Chloride: 104 mEq/L (ref 96–112)
GFR calc Af Amer: >90 mL/min (ref >90)
GFR calc non Af Amer: >90 mL/min (ref >90)
Glucose, Bld: 187 mg/dL — ABNORMAL HIGH (ref 70–99)
Potassium: 4.1 mEq/L (ref 3.7–5.3)
Sodium: 140 mEq/L (ref 137–147)
Total Bilirubin: 1.7 mg/dL — ABNORMAL HIGH (ref 0.3–1.2)
Total Protein: 7.8 g/dL (ref 6.0–8.3)

## 2014-07-20 LAB — CBC WITH DIFFERENTIAL/PLATELET
Basophils Absolute: 0 10*3/uL (ref 0.0–0.1)
Basophils Relative: 0 % (ref 0–1)
EOS PCT: 1 % (ref 0–5)
Eosinophils Absolute: 0.1 10*3/uL (ref 0.0–0.7)
HEMATOCRIT: 36.3 % — AB (ref 39.0–52.0)
Hemoglobin: 11.4 g/dL — ABNORMAL LOW (ref 13.0–17.0)
LYMPHS ABS: 2 10*3/uL (ref 0.7–4.0)
Lymphocytes Relative: 28 % (ref 12–46)
MCH: 23.9 pg — ABNORMAL LOW (ref 26.0–34.0)
MCHC: 31.4 g/dL (ref 30.0–36.0)
MCV: 76.1 fL — AB (ref 78.0–100.0)
Monocytes Absolute: 0.6 10*3/uL (ref 0.1–1.0)
Monocytes Relative: 8 % (ref 3–12)
NEUTROS ABS: 4.7 10*3/uL (ref 1.7–7.7)
Neutrophils Relative %: 63 % (ref 43–77)
Platelets: 226 10*3/uL (ref 150–400)
RBC: 4.77 MIL/uL (ref 4.22–5.81)
RDW: 16 % — ABNORMAL HIGH (ref 11.5–15.5)
WBC: 7.4 10*3/uL (ref 4.0–10.5)

## 2014-07-20 LAB — TROPONIN I

## 2014-07-20 LAB — HEMOGLOBIN A1C
HEMOGLOBIN A1C: 8.2 % — AB (ref <5.7)
Mean Plasma Glucose: 189 mg/dL — ABNORMAL HIGH (ref <117)

## 2014-07-20 NOTE — Progress Notes (Signed)
Echocardiogram 2D Echocardiogram has been performed.  Marcus Johnson 07/20/2014, 3:03 PM

## 2014-07-20 NOTE — Progress Notes (Signed)
Heart Failure Navigator Consult Note  Presentation: Marcus Johnson is a 53 y.o. Male with a h/o low EF (last echo was 10%). Has been seeing his PCP for the last 1 months with c/o LE swelling. This came on gradually and his PCP has been increasing his lasix and added aldactone. He has been sleeping on 4+ pillows at home. He does endorse SOB with exertion.  He has DM and his BS have been well controlled Last hospital admission was 10/26/13 and he was to follow up with cardiology- not sure why this did not happen  In the ER, he was found to be 30+lbs up from his last weigh in. He has LE edema- not much fluid on chest x ray.    Past Medical History  Diagnosis Date  . Hypertension   . Coronary artery disease     a) s/p CABG 2002 b) LHC 5/13:  dLM 80%, LAD subtotally occluded, pCFX occluded, pRCA 50%, mid? Occlusion with high take off of the PDA with 70% multiple lesions-not bypassed and supplies collaterals to LAD, LIMA-IM/ramus ok, S-OM ok, S-PLA branches ok. Medical therapy was recommended  . Chronic systolic heart failure     Secondary to ischemic cardiomyopathy with EF 35-40% by cath 2002, last echo 11/2008 - EF 20-25%  . Diabetes mellitus     type 2 requiring insulin   . MRSA (methicillin resistant Staphylococcus aureus)     Status post right foot plantar deep infection with MRSA status post  I&D 10/2008  . Noncompliance   . Peripheral neuropathy   . Ischemic cardiomyopathy     a. Echo 5/13: Mild LVE, mild LVH, EF 10%, anteroseptal, lateral, apical AK, mild MR, mild LAE, moderately reduced RVSF, mild RAE, PASP 60  . Single-chamber  implantable cardiac defibrillator - Medtronic   . Syncope     History   Social History  . Marital Status: Single    Spouse Name: N/A    Number of Children: N/A  . Years of Education: N/A   Social History Main Topics  . Smoking status: Never Smoker   . Smokeless tobacco: Never Used  . Alcohol Use: No  . Drug Use: No  . Sexual Activity: Yes    Other Topics Concern  . None   Social History Narrative    ECHO:  BNP    Component Value Date/Time   PROBNP 730.5* 07/19/2014 1512    Education Assessment and Provision:  Detailed education and instructions provided on heart failure disease management including the following:  Signs and symptoms of Heart Failure When to call the physician Importance of daily weights Low sodium diet Fluid restriction Medication management Anticipated future follow-up appointments  Patient education given on each of the above topics.  Patient acknowledges understanding and acceptance of all instructions.  I spoke with patient regarding his HF diagnosis.  He did not have cardiology follow-up after last hosp admissionin March.  He has been taking Lasix 40 BID at home and feels as though he is "not getting rid of fluid".  He denies skipping any medications.  He is able to teach back most topics listed above.  He does have a scale at home and says that he weighs.  Of note he has gained 30 pounds since office visit on 02/16/14.  He works daily-- often uses stairs and walks quite a bit at work.  Says that he was doing "fine" until recently when "fluid started building up again".  Education Materials:  "Living Better With  Heart Failure" Booklet, Daily Weight Tracker Tool .   High Risk Criteria for Readmission and/or Poor Patient Outcomes:  (Recommend Follow-up with Advanced Heart Failure Clinic)--Yes   EF <30%- Yes per previous echo 10% await new echo  2 or more admissions in 6 months- No  Difficult social situation- No  Demonstrates medication noncompliance- No    Barriers of Care:  Knowledge and compliance  Discharge Planning:   Plans to discharge to home with brother and family.  He would benefit from AHF clinic outpatient hospital follow-up appt/transition for education and compliance reinforcement.

## 2014-07-20 NOTE — Progress Notes (Signed)
UR completed 

## 2014-07-20 NOTE — Progress Notes (Signed)
[facility] PROGRESS NOTE  Marcus Johnson [id number] DOB: [birth year] DOA: 07/19/2014 PCP: [name], MD  Assessment/Plan: 1. Acute systolic heart failure: admitted to telemetry. Started on IV lasix. Resume lisinopril. Daily weights. And strict intake and output. Compression stocking and elevate the legs 2. Diabetes mellitus: CBG (last 3)   Recent Labs  07/20/14 0623 07/20/14 1109 07/20/14 1624  GLUCAP 148* 158* 171*    Resume lantus and SSI.   dvt prophylaxis.   Code Status: full code.  Family Communication: none atbedside Disposition Plan: pending.    Consultants:  none  Procedures:  none  Antibiotics:  none  HPI/Subjective: Cheerful. No new complaints.   Objective: Filed Vitals:   07/20/14 1300  BP: 120/75  Pulse: 77  Temp: 97.7 F (36.5 C)  Resp: 20    Intake/Output Summary (Last 24 hours) at 07/20/14 1759 Last data filed at 07/20/14 1230  Gross per 24 hour  Intake   1080 ml  Output   2800 ml  Net  -1720 ml   Filed Weights   07/19/14 1924 07/20/14 0403  Weight: 110.587 kg (243 lb 12.8 oz) 110.315 kg (243 lb 3.2 oz)    Exam:   General:  Alert afebrile comfortable, sitting in chair,   Cardiovascular: s1s2  Respiratory: scattered rales at bases  Abdomen: soft distended, bowel sounds heard, non tender  Musculoskeletal: 3+ pedal edema.   Data Reviewed: Basic Metabolic Panel:  Recent Labs Lab 07/19/14 1512 07/20/14 0117  NA 140 140  K 4.2 4.1  CL 101 104  CO2 24 22  GLUCOSE 138* 187*  BUN 26* 28*  CREATININE 1.05 0.97  CALCIUM 9.2 9.1   Liver Function Tests:  Recent Labs Lab 07/20/14 0117  AST 43*  ALT 29  ALKPHOS 216*  BILITOT 1.7*  PROT 7.8  ALBUMIN 2.9*   No results for input(s): LIPASE, AMYLASE in the last 168 hours. No results for input(s): AMMONIA in the last 168 hours. CBC:  Recent Labs Lab 07/19/14 1512 07/20/14 0117  WBC 8.4 7.4  NEUTROABS  --  4.7  HGB 12.2* 11.4*  HCT 38.7* 36.3*   MCV 78.3 76.1*  PLT 221 226   Cardiac Enzymes:  Recent Labs Lab 07/19/14 2010 07/20/14 0117 07/20/14 0830  TROPONINI <0.30 <0.30 <0.30   BNP (last 3 results)  Recent Labs  10/19/13 1903 07/19/14 1512  PROBNP 1605.0* 730.5*   CBG:  Recent Labs Lab 07/19/14 1727 07/19/14 2207 07/20/14 0623 07/20/14 1109 07/20/14 1624  GLUCAP 109* 136* 148* 158* 171*    Recent Results (from the past 240 hour(s))  MRSA PCR Screening     Status: None   Collection Time: 07/19/14  8:07 PM  Result Value Ref Range Status   MRSA by PCR NEGATIVE NEGATIVE Final    Comment:        The GeneXpert MRSA Assay (FDA approved for NASAL specimens only), is one component of a comprehensive MRSA colonization surveillance program. It is not intended to diagnose MRSA infection nor to guide or monitor treatment for MRSA infections.      Studies: Dg Chest 2 View  07/19/2014   CLINICAL DATA:  Shortness of breath for 1 month.  EXAM: CHEST  2 VIEW  COMPARISON:  PA and lateral chest 10/21/2013 and 01/03/2012. CT chest 11/05/2008.  FINDINGS: The patient is status post CABG with an AICD in place. There is cardiomegaly but no edema. The lungs are clear. No pneumothorax or pleural effusion.  IMPRESSION: Cardiomegaly without acute disease.  Electronically Signed   By: Inge Rise M.D.   On: 07/19/2014 17:01   Ct Outside Films Body  07/19/2014   This examination belongs to an outside facility and is stored  here for comparison purposes only.  Contact the originating outside  institution for any associated report or interpretation.   Scheduled Meds: . aspirin EC  81 mg Oral Daily  . atorvastatin  80 mg Oral Daily  . carvedilol  12.5 mg Oral BID WC  . enoxaparin (LOVENOX) injection  40 mg Subcutaneous Q24H  . furosemide  60 mg Intravenous Q12H  . Influenza vac split quadrivalent PF  0.5 mL Intramuscular Tomorrow-1000  . insulin aspart  0-15 Units Subcutaneous TID WC  . insulin aspart  0-5 Units  Subcutaneous QHS  . insulin glargine  60 Units Subcutaneous QHS  . lisinopril  5 mg Oral BID  . potassium chloride  20 mEq Oral Daily  . sodium chloride  3 mL Intravenous Q12H   Continuous Infusions:   Active Problems:   Dyspnea   Acute systolic congestive heart failure   Lower extremity edema   DM (diabetes mellitus) type II controlled with renal manifestation    Time spent: 25 minutes.     [place] [facility] Pager [phone] If 7PM-7AM, please contact night-coverage at [email or web address], password [facility] 07/20/2014, 5:59 PM  LOS: 1 day

## 2014-07-20 NOTE — Plan of Care (Signed)
Problem: Phase I Progression Outcomes Goal: EF % per last Echo/documented,Core Reminder form on chart Outcome: Completed/Met Date Met:  07/20/14 10 % by 2-echo

## 2014-07-21 ENCOUNTER — Encounter (HOSPITAL_COMMUNITY): Payer: Self-pay | Admitting: Nurse Practitioner

## 2014-07-21 DIAGNOSIS — I255 Ischemic cardiomyopathy: Secondary | ICD-10-CM | POA: Diagnosis present

## 2014-07-21 DIAGNOSIS — E669 Obesity, unspecified: Secondary | ICD-10-CM

## 2014-07-21 DIAGNOSIS — I2510 Atherosclerotic heart disease of native coronary artery without angina pectoris: Secondary | ICD-10-CM | POA: Insufficient documentation

## 2014-07-21 DIAGNOSIS — I5023 Acute on chronic systolic (congestive) heart failure: Principal | ICD-10-CM

## 2014-07-21 LAB — GLUCOSE, CAPILLARY
GLUCOSE-CAPILLARY: 164 mg/dL — AB (ref 70–99)
GLUCOSE-CAPILLARY: 158 mg/dL — AB (ref 70–99)
Glucose-Capillary: 120 mg/dL — ABNORMAL HIGH (ref 70–99)
Glucose-Capillary: 188 mg/dL — ABNORMAL HIGH (ref 70–99)

## 2014-07-21 LAB — BASIC METABOLIC PANEL
Anion gap: 15 (ref 5–15)
BUN: 31 mg/dL — ABNORMAL HIGH (ref 6–23)
CO2: 22 mEq/L (ref 19–32)
Calcium: 9 mg/dL (ref 8.4–10.5)
Chloride: 100 mEq/L (ref 96–112)
Creatinine, Ser: 0.96 mg/dL (ref 0.50–1.35)
Glucose, Bld: 175 mg/dL — ABNORMAL HIGH (ref 70–99)
POTASSIUM: 4 meq/L (ref 3.7–5.3)
SODIUM: 137 meq/L (ref 137–147)

## 2014-07-21 MED ORDER — ISOSORB DINITRATE-HYDRALAZINE 20-37.5 MG PO TABS
0.5000 | ORAL_TABLET | Freq: Three times a day (TID) | ORAL | Status: DC
  Administered 2014-07-21 – 2014-07-28 (×21): 0.5 via ORAL
  Filled 2014-07-21 (×23): qty 0.5

## 2014-07-21 MED ORDER — FLUTICASONE PROPIONATE 50 MCG/ACT NA SUSP
1.0000 | Freq: Every day | NASAL | Status: DC
  Administered 2014-07-21: 1 via NASAL
  Filled 2014-07-21 (×2): qty 16

## 2014-07-21 MED ORDER — ISOSORB DINITRATE-HYDRALAZINE 20-37.5 MG PO TABS
0.5000 | ORAL_TABLET | Freq: Two times a day (BID) | ORAL | Status: DC
  Filled 2014-07-21: qty 0.5

## 2014-07-21 MED ORDER — POTASSIUM CHLORIDE ER 10 MEQ PO TBCR
20.0000 meq | EXTENDED_RELEASE_TABLET | Freq: Two times a day (BID) | ORAL | Status: DC
  Administered 2014-07-21 – 2014-07-31 (×21): 20 meq via ORAL
  Filled 2014-07-21 (×23): qty 2

## 2014-07-21 MED ORDER — FUROSEMIDE 10 MG/ML IJ SOLN
80.0000 mg | Freq: Three times a day (TID) | INTRAMUSCULAR | Status: DC
  Administered 2014-07-21 – 2014-07-24 (×8): 80 mg via INTRAVENOUS
  Filled 2014-07-21 (×10): qty 8

## 2014-07-21 NOTE — Progress Notes (Signed)
PROGRESS NOTE  Marcus Johnson [id number] DOB: [birth year] DOA: 07/19/2014 PCP: [name], MD  HPI/Recap of past 77 hours: 53 year old African-American male with past medical history of ischemic cardio myopathy and congestive heart failure with low ejection fraction has had slowly increased lower extremity swelling in the past month with increasing orthopnea and shortness of breath on exertion who was admitted to the hospitalist service on 12/14. On admission he was found to be 30 pounds from previous weigh-in.  Assessment/Plan: Principal Problem:   Acute systolic congestive heart failure: Improving, patient receiving education. He has diuresed well and is down 3 L.  He has a history of ICD placement and is followed with electrophysiology, but does not have a cardiologist following who has been following his heart failure. Have consult at [facility] heart echocardiogram done on admission noting ejection fraction 20-25%  Active Problems:   Dyspnea: Resolving   Lower extremity edema: Secondary to heart failure, improving   DM (diabetes mellitus) type II controlled with renal manifestation: CBGs Well-controlled, under 200   Obesity (BMI 30-39.9): Patient meets criteria with BMI greater than 30   Cardiomyopathy, ischemic   Code Status: Full code  Family Communication: No family present  Disposition Plan: Discharge once fully diuresed   Consultants:  Cardiology  Procedures:   Echocardiogram done 12/15: EF 20-25%  Antibiotics:   None   Objective: BP 109/60 mmHg  Pulse 73  Temp(Src) 98.2 F (36.8 C) (Oral)  Resp 18  Ht 5\' 10"  (1.778 m)  Wt 110.043 kg (242 lb 9.6 oz)  BMI 34.81 kg/m2  SpO2 100%  Intake/Output Summary (Last 24 hours) at 07/21/14 1650 Last data filed at 07/21/14 1230  Gross per 24 hour  Intake    840 ml  Output   2000 ml  Net  -1160 ml   Filed Weights   07/19/14 1924 07/20/14 0403 07/21/14 0300  Weight: 110.587 kg (243 lb 12.8 oz) 110.315 kg (243  lb 3.2 oz) 110.043 kg (242 lb 9.6 oz)    Exam:   General:  Alert and oriented 3, no acute distress  Cardiovascular:  Regular rate and rhythm [id number], 2/6 systolic ejection murmur  Respiratory:  Clear to auscultation bilaterally  Abdomen:  Soft, nontender, nondistended, positive bowel sounds  Musculoskeletal:  No clubbing or cyanosis, trace edema   Data Reviewed: Basic Metabolic Panel:  Recent Labs Lab 07/19/14 1512 07/20/14 0117 07/21/14 0232  NA 140 140 137  K 4.2 4.1 4.0  CL 101 104 100  CO2 24 22 22   GLUCOSE 138* 187* 175*  BUN 26* 28* 31*  CREATININE 1.05 0.97 0.96  CALCIUM 9.2 9.1 9.0   Liver Function Tests:  Recent Labs Lab 07/20/14 0117  AST 43*  ALT 29  ALKPHOS 216*  BILITOT 1.7*  PROT 7.8  ALBUMIN 2.9*   No results for input(s): LIPASE, AMYLASE in the last 168 hours. No results for input(s): AMMONIA in the last 168 hours. CBC:  Recent Labs Lab 07/19/14 1512 07/20/14 0117  WBC 8.4 7.4  NEUTROABS  --  4.7  HGB 12.2* 11.4*  HCT 38.7* 36.3*  MCV 78.3 76.1*  PLT 221 226   Cardiac Enzymes:    Recent Labs Lab 07/19/14 2010 07/20/14 0117 07/20/14 0830  TROPONINI <0.30 <0.30 <0.30   BNP (last 3 results)  Recent Labs  10/19/13 1903 07/19/14 1512  PROBNP 1605.0* 730.5*   CBG:  Recent Labs Lab 07/20/14 1624 07/20/14 2204 07/21/14 0657 07/21/14 1111 07/21/14 1621  GLUCAP 171*  215* 120* 164* 158*    Recent Results (from the past 240 hour(s))  MRSA PCR Screening     Status: None   Collection Time: 07/19/14  8:07 PM  Result Value Ref Range Status   MRSA by PCR NEGATIVE NEGATIVE Final    Comment:        The GeneXpert MRSA Assay (FDA approved for NASAL specimens only), is one component of a comprehensive MRSA colonization surveillance program. It is not intended to diagnose MRSA infection nor to guide or monitor treatment for MRSA infections.      Studies: No results found.  Scheduled Meds: . aspirin EC  81 mg Oral  Daily  . atorvastatin  80 mg Oral Daily  . carvedilol  12.5 mg Oral BID WC  . enoxaparin (LOVENOX) injection  40 mg Subcutaneous Q24H  . fluticasone  1 spray Each Nare Daily  . furosemide  60 mg Intravenous Q12H  . Influenza vac split quadrivalent PF  0.5 mL Intramuscular Tomorrow-1000  . insulin aspart  0-15 Units Subcutaneous TID WC  . insulin aspart  0-5 Units Subcutaneous QHS  . insulin glargine  60 Units Subcutaneous QHS  . lisinopril  5 mg Oral BID  . potassium chloride  20 mEq Oral Daily  . sodium chloride  3 mL Intravenous Q12H    Continuous Infusions:    Time spent:  25 minutes  Annita Brod  [facility] Pager [phone]. If 7PM-7AM, please contact night-coverage at [email or web address], password [facility] 07/21/2014, 4:50 PM  LOS: 2 days
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
## 2014-07-22 LAB — BASIC METABOLIC PANEL
Anion gap: 14 (ref 5–15)
BUN: 35 mg/dL — ABNORMAL HIGH (ref 6–23)
CO2: 23 mEq/L (ref 19–32)
Calcium: 9 mg/dL (ref 8.4–10.5)
Chloride: 103 mEq/L (ref 96–112)
Creatinine, Ser: 1.08 mg/dL (ref 0.50–1.35)
GFR calc Af Amer: 89 mL/min — ABNORMAL LOW (ref >90)
GFR, EST NON AFRICAN AMERICAN: 77 mL/min — AB (ref >90)
Glucose, Bld: 165 mg/dL — ABNORMAL HIGH (ref 70–99)
POTASSIUM: 4.3 meq/L (ref 3.7–5.3)
SODIUM: 140 meq/L (ref 137–147)

## 2014-07-22 LAB — GLUCOSE, CAPILLARY
GLUCOSE-CAPILLARY: 103 mg/dL — AB (ref 70–99)
GLUCOSE-CAPILLARY: 109 mg/dL — AB (ref 70–99)
Glucose-Capillary: 145 mg/dL — ABNORMAL HIGH (ref 70–99)
Glucose-Capillary: 139 mg/dL — ABNORMAL HIGH (ref 70–99)

## 2014-07-22 MED ORDER — FLUTICASONE PROPIONATE 50 MCG/ACT NA SUSP
1.0000 | Freq: Every day | NASAL | Status: DC
  Administered 2014-07-22 – 2014-08-02 (×12): 1 via NASAL

## 2014-07-22 MED ORDER — SPIRONOLACTONE 12.5 MG HALF TABLET
12.5000 mg | ORAL_TABLET | Freq: Every day | ORAL | Status: DC
  Administered 2014-07-22 – 2014-07-31 (×10): 12.5 mg via ORAL
  Filled 2014-07-22 (×11): qty 1

## 2014-07-22 NOTE — Progress Notes (Signed)
Patient ID: Marcus Johnson, male   DOB: Jun 15, 1961, 53 y.o.   MRN: [id number]   SUBJECTIVE: No complaints this morning.  Got 1st dose of Lasix 80 mg IV last night, diuresed well.    Scheduled Meds: . aspirin EC  81 mg Oral Daily  . atorvastatin  80 mg Oral Daily  . carvedilol  12.5 mg Oral BID WC  . enoxaparin (LOVENOX) injection  40 mg Subcutaneous Q24H  . fluticasone  1 spray Each Nare Daily  . furosemide  80 mg Intravenous 3 times per day  . Influenza vac split quadrivalent PF  0.5 mL Intramuscular Tomorrow-1000  . insulin aspart  0-15 Units Subcutaneous TID WC  . insulin aspart  0-5 Units Subcutaneous QHS  . insulin glargine  60 Units Subcutaneous QHS  . isosorbide-hydrALAZINE  0.5 tablet Oral TID  . lisinopril  5 mg Oral BID  . potassium chloride  20 mEq Oral BID  . sodium chloride  3 mL Intravenous Q12H  . spironolactone  12.5 mg Oral Daily   Continuous Infusions:  PRN Meds:.sodium chloride, acetaminophen, ondansetron (ZOFRAN) IV, sodium chloride    Filed Vitals:   07/21/14 1541 07/21/14 2127 07/22/14 0300 07/22/14 0353  BP: 109/60 112/70  112/70  Pulse: 73 71  73  Temp: 98.2 F (36.8 C) 97.7 F (36.5 C)  97.5 F (36.4 C)  TempSrc: Oral Oral  Oral  Resp: 18 18  18   Height:      Weight:   243 lb 2.7 oz (110.3 kg)   SpO2: 100% 100%  100%    Intake/Output Summary (Last 24 hours) at 07/22/14 0855 Last data filed at 07/22/14 [id number]  Gross per 24 hour  Intake    360 ml  Output   1775 ml  Net  -1415 ml    LABS: Basic Metabolic Panel:  Recent Labs  07/21/14 0232 07/22/14 0330  NA 137 140  K 4.0 4.3  CL 100 103  CO2 22 23  GLUCOSE 175* 165*  BUN 31* 35*  CREATININE 0.96 1.08  CALCIUM 9.0 9.0   Liver Function Tests:  Recent Labs  07/20/14 0117  AST 43*  ALT 29  ALKPHOS 216*  BILITOT 1.7*  PROT 7.8  ALBUMIN 2.9*   No results for input(s): LIPASE, AMYLASE in the last 72 hours. CBC:  Recent Labs  07/19/14 1512 07/20/14 0117  WBC 8.4 7.4    NEUTROABS  --  4.7  HGB 12.2* 11.4*  HCT 38.7* 36.3*  MCV 78.3 76.1*  PLT 221 226   Cardiac Enzymes:  Recent Labs  07/19/14 2010 07/20/14 0117 07/20/14 0830  TROPONINI <0.30 <0.30 <0.30   BNP: Invalid input(s): POCBNP D-Dimer: No results for input(s): DDIMER in the last 72 hours. Hemoglobin A1C:  Recent Labs  07/19/14 2010  HGBA1C 8.2*   Fasting Lipid Panel: No results for input(s): CHOL, HDL, LDLCALC, TRIG, CHOLHDL, LDLDIRECT in the last 72 hours. Thyroid Function Tests:  Recent Labs  07/19/14 2010  TSH 2.710   Anemia Panel: No results for input(s): VITAMINB12, FOLATE, FERRITIN, TIBC, IRON, RETICCTPCT in the last 72 hours.  RADIOLOGY: Dg Chest 2 View  07/19/2014   CLINICAL DATA:  Shortness of breath for 1 month.  EXAM: CHEST  2 VIEW  COMPARISON:  PA and lateral chest 10/21/2013 and 01/03/2012. CT chest 11/05/2008.  FINDINGS: The patient is status post CABG with an AICD in place. There is cardiomegaly but no edema. The lungs are clear. No pneumothorax or pleural effusion.  IMPRESSION: Cardiomegaly without acute disease.   Electronically Signed   By: Inge Rise M.D.   On: 07/19/2014 17:01   Ct Outside Films Body  07/19/2014   This examination belongs to an outside facility and is stored  here for comparison purposes only.  Contact the originating outside  institution for any associated report or interpretation.   PHYSICAL EXAM General: NAD Neck: JVP 14 cm, no thyromegaly or thyroid nodule.  Lungs: Clear to auscultation bilaterally with normal respiratory effort. CV: Nondisplaced PMI.  Heart regular S1/S2, no S3/S4, no murmur.  2+ edema to thighs.   Abdomen: Soft, nontender, no hepatosplenomegaly, mild distention.  Neurologic: Alert and oriented x 3.  Psych: Normal affect. Extremities: No clubbing or cyanosis.   TELEMETRY: Reviewed telemetry pt in NSR  ASSESSMENT AND PLAN: 1. Acute on chronic systolic CHF: Ischemic cardiomyopathy, EF 20-25% this  admit. He is very volume overloaded, this has built up gradually over time. NYHA class IIIb symptoms. He needs a lot more diuresis. Has [employer] ICD. Creatinine stable with diuresis so far.  - Continue Lasix 80 mg IV every 8 hrs (got first dose of this last night). - Can continue current Coreg, Bidil, and lisinopril.  - Add spironolactone 12.5 daily.   - Needs teaching on low sodium diet.  - When more euvolemic, will consider RHC.  - After discharge should followup in CHF clinic.  2. CAD: s/p CABG. No ACS this admission. Last cath 2013 with patent grafts. No chest pain. Continue ASA and statin.  3. Diabetes: per primary service.   Loralie Champagne 07/22/2014 8:57 AM

## 2014-07-22 NOTE — Progress Notes (Signed)
PROGRESS NOTE  Marcus Johnson [id number] DOB: 06-25-1961 DOA: 07/19/2014 PCP: [name], MD  HPI/Recap of past 35 hours: 53 year old African-American male with past medical history of ischemic cardio myopathy and congestive heart failure with low ejection fraction has had slowly increased lower extremity swelling in the past month with increasing orthopnea and shortness of breath on exertion who was admitted to the hospitalist service on 12/14. On admission he was found to be 30 pounds from previous weigh-in.  Patient today continues to do well. Breathing comfortable. Continues to diurese.  Assessment/Plan: Principal Problem:   Acute systolic congestive heart failure: Improving, patient receiving education. He has diuresed well and is down over 4 L. He has a history of ICD placement and is followed with electrophysiology, but does not have a cardiologist following who has been following his heart failure. Echocardiogram done on admission noting ejection fraction 20-25%.  Cardiology following now. On Coreg, BiDil and lisinopril plus Spiriva lactone  Active Problems:   Dyspnea: Resolving   Lower extremity edema: Secondary to heart failure, improving   DM (diabetes mellitus) type II controlled with renal manifestation: CBGs Well-controlled, under 200   Obesity (BMI 30-39.9): Patient meets criteria with BMI greater than 30   Cardiomyopathy, ischemic/CAD: On statin, aspirin   Code Status: Full code  Family Communication: Patient told me he will speak with his son  Disposition Plan: Discharge once fully diuresed, may get right heart cath before discharge   Consultants:  Cardiology  Procedures:   Echocardiogram done 12/15: EF 20-25%  Antibiotics:   None   Objective: BP 112/70 mmHg  Pulse 73  Temp(Src) 97.5 F (36.4 C) (Oral)  Resp 18  Ht 5\' 10"  (1.778 m)  Wt 110.3 kg (243 lb 2.7 oz)  BMI 34.89 kg/m2  SpO2 100%  Intake/Output Summary (Last 24 hours) at 07/22/14  0936 Last data filed at 07/22/14 [id number]  Gross per 24 hour  Intake    360 ml  Output   1775 ml  Net  -1415 ml   Filed Weights   07/20/14 0403 07/21/14 0300 07/22/14 0300  Weight: 110.315 kg (243 lb 3.2 oz) 110.043 kg (242 lb 9.6 oz) 110.3 kg (243 lb 2.7 oz)    Exam: Unchanged  General:  Alert and oriented 3, no acute distress  Cardiovascular:  Regular rate and rhythm [id number], 2/6 systolic ejection murmur  Respiratory:  Clear to auscultation bilaterally  Abdomen:  Soft, nontender, nondistended, positive bowel sounds  Musculoskeletal:  No clubbing or cyanosis, trace edema   Data Reviewed: Basic Metabolic Panel:  Recent Labs Lab 07/19/14 1512 07/20/14 0117 07/21/14 0232 07/22/14 0330  NA 140 140 137 140  K 4.2 4.1 4.0 4.3  CL 101 104 100 103  CO2 24 22 22 23   GLUCOSE 138* 187* 175* 165*  BUN 26* 28* 31* 35*  CREATININE 1.05 0.97 0.96 1.08  CALCIUM 9.2 9.1 9.0 9.0   Liver Function Tests:  Recent Labs Lab 07/20/14 0117  AST 43*  ALT 29  ALKPHOS 216*  BILITOT 1.7*  PROT 7.8  ALBUMIN 2.9*   No results for input(s): LIPASE, AMYLASE in the last 168 hours. No results for input(s): AMMONIA in the last 168 hours. CBC:  Recent Labs Lab 07/19/14 1512 07/20/14 0117  WBC 8.4 7.4  NEUTROABS  --  4.7  HGB 12.2* 11.4*  HCT 38.7* 36.3*  MCV 78.3 76.1*  PLT 221 226   Cardiac Enzymes:    Recent Labs Lab 07/19/14 2010 07/20/14 0117 07/20/14  0830  TROPONINI <0.30 <0.30 <0.30   BNP (last 3 results)  Recent Labs  10/19/13 1903 07/19/14 1512  PROBNP 1605.0* 730.5*   CBG:  Recent Labs Lab 07/20/14 2204 07/21/14 0657 07/21/14 1111 07/21/14 1621 07/21/14 2130  GLUCAP 215* 120* 164* 158* 188*    Recent Results (from the past 240 hour(s))  MRSA PCR Screening     Status: None   Collection Time: 07/19/14  8:07 PM  Result Value Ref Range Status   MRSA by PCR NEGATIVE NEGATIVE Final    Comment:        The GeneXpert MRSA Assay (FDA approved for NASAL  specimens only), is one component of a comprehensive MRSA colonization surveillance program. It is not intended to diagnose MRSA infection nor to guide or monitor treatment for MRSA infections.      Studies: No results found.  Scheduled Meds: . aspirin EC  81 mg Oral Daily  . atorvastatin  80 mg Oral Daily  . carvedilol  12.5 mg Oral BID WC  . enoxaparin (LOVENOX) injection  40 mg Subcutaneous Q24H  . fluticasone  1 spray Each Nare Daily  . furosemide  80 mg Intravenous 3 times per day  . Influenza vac split quadrivalent PF  0.5 mL Intramuscular Tomorrow-1000  . insulin aspart  0-15 Units Subcutaneous TID WC  . insulin aspart  0-5 Units Subcutaneous QHS  . insulin glargine  60 Units Subcutaneous QHS  . isosorbide-hydrALAZINE  0.5 tablet Oral TID  . lisinopril  5 mg Oral BID  . potassium chloride  20 mEq Oral BID  . sodium chloride  3 mL Intravenous Q12H  . spironolactone  12.5 mg Oral Daily    Continuous Infusions:    Time spent:  15 minutes  Annita Brod  [facility] Pager [phone]. If 7PM-7AM, please contact night-coverage at [email or web address], password [facility] 07/22/2014, 9:36 AM  LOS: 3 days

## 2014-07-23 LAB — BASIC METABOLIC PANEL
ANION GAP: 12 (ref 5–15)
BUN: 39 mg/dL — ABNORMAL HIGH (ref 6–23)
CALCIUM: 9.3 mg/dL (ref 8.4–10.5)
CO2: 26 meq/L (ref 19–32)
CREATININE: 1.17 mg/dL (ref 0.50–1.35)
Chloride: 105 mEq/L (ref 96–112)
GFR, EST AFRICAN AMERICAN: 81 mL/min — AB (ref >90)
GFR, EST NON AFRICAN AMERICAN: 70 mL/min — AB (ref >90)
Glucose, Bld: 88 mg/dL (ref 70–99)
Potassium: 4.5 mEq/L (ref 3.7–5.3)
SODIUM: 143 meq/L (ref 137–147)

## 2014-07-23 LAB — GLUCOSE, CAPILLARY
GLUCOSE-CAPILLARY: 92 mg/dL (ref 70–99)
Glucose-Capillary: 65 mg/dL — ABNORMAL LOW (ref 70–99)
Glucose-Capillary: 149 mg/dL — ABNORMAL HIGH (ref 70–99)
Glucose-Capillary: 162 mg/dL — ABNORMAL HIGH (ref 70–99)
Glucose-Capillary: 128 mg/dL — ABNORMAL HIGH (ref 70–99)

## 2014-07-23 MED ORDER — METOLAZONE 2.5 MG PO TABS
2.5000 mg | ORAL_TABLET | Freq: Once | ORAL | Status: AC
  Administered 2014-07-23: 2.5 mg via ORAL
  Filled 2014-07-23: qty 1

## 2014-07-23 NOTE — Progress Notes (Signed)
[facility] PROGRESS NOTE Interim History: 53 year old African-American male with past medical history of ischemic cardio myopathy and congestive heart failure with low ejection fraction has had slowly increased lower extremity swelling in the past month with increasing orthopnea and shortness of breath on exertion who was admitted to the hospitalist service on 12/14. On admission he was found to be 30 pounds from previous weigh-in.  Filed Weights   07/21/14 0300 07/22/14 0300 07/23/14 0644  Weight: 110.043 kg (242 lb 9.6 oz) 110.3 kg (243 lb 2.7 oz) 109.408 kg (241 lb 3.2 oz)        Intake/Output Summary (Last 24 hours) at 07/23/14 1148 Last data filed at 07/23/14 0900  Gross per 24 hour  Intake    480 ml  Output   2925 ml  Net  -2445 ml     Assessment/Plan: Acute systolic congestive heart failure - On IV lasix, with brisk diuresis. Agree with metolazone has plenty of weight to lose. - cont to monitor electrolytes. - weight cont to improve, 110->109.4 kg, Cont strict I and os. - continue current Coreg, Bidil, spironolactone, and lisinopril.  - on 3.23.2015 EDW 89.6 kg.   Obesity (BMI 30-39.9) - counseling   DM (diabetes mellitus) type II controlled with renal manifestation:  - CBGs Well-controlled, under 200  Cardiomyopathy, ischemic/CAD: On statin, aspirin     Code Status: full Family Communication: none  Disposition Plan: inpatient   Consultants:  cardiology  Procedures: ECHO: none  Antibiotics:  None  HPI/Subjective: Breathing is better  Objective: Filed Vitals:   07/22/14 0353 07/22/14 1346 07/22/14 2110 07/23/14 0644  BP: 112/70 109/59 110/74 123/83  Pulse: 73 74 71 75  Temp: 97.5 F (36.4 C) 97.6 F (36.4 C) 98.1 F (36.7 C) 98.1 F (36.7 C)  TempSrc: Oral Oral Oral Oral  Resp: 18 18 18 18   Height:      Weight:    109.408 kg (241 lb 3.2 oz)  SpO2: 100% 100% 100% 100%     Exam:  General: Alert, awake, oriented x3, in no  acute distress.  HEENT: No bruits, no goiter. +JVD Heart: Regular rate and rhythm. Lungs: Good air movement, bilateral air movement.  Abdomen: Soft, nontender, nondistended, positive bowel sounds.   Data Reviewed: Basic Metabolic Panel:  Recent Labs Lab 07/19/14 1512 07/20/14 0117 07/21/14 0232 07/22/14 0330 07/23/14 0455  NA 140 140 137 140 143  K 4.2 4.1 4.0 4.3 4.5  CL 101 104 100 103 105  CO2 24 22 22 23 26   GLUCOSE 138* 187* 175* 165* 88  BUN 26* 28* 31* 35* 39*  CREATININE 1.05 0.97 0.96 1.08 1.17  CALCIUM 9.2 9.1 9.0 9.0 9.3   Liver Function Tests:  Recent Labs Lab 07/20/14 0117  AST 43*  ALT 29  ALKPHOS 216*  BILITOT 1.7*  PROT 7.8  ALBUMIN 2.9*   No results for input(s): LIPASE, AMYLASE in the last 168 hours. No results for input(s): AMMONIA in the last 168 hours. CBC:  Recent Labs Lab 07/19/14 1512 07/20/14 0117  WBC 8.4 7.4  NEUTROABS  --  4.7  HGB 12.2* 11.4*  HCT 38.7* 36.3*  MCV 78.3 76.1*  PLT 221 226   Cardiac Enzymes:  Recent Labs Lab 07/19/14 2010 07/20/14 0117 07/20/14 0830  TROPONINI <0.30 <0.30 <0.30   BNP (last 3 results)  Recent Labs  10/19/13 1903 07/19/14 1512  PROBNP 1605.0* 730.5*   CBG:  Recent Labs Lab 07/22/14 1617 07/22/14 2104 07/23/14 0641 07/23/14 [id number]  07/23/14 1132  GLUCAP 139* 109* 65* 92 149*    Recent Results (from the past 240 hour(s))  MRSA PCR Screening     Status: None   Collection Time: 07/19/14  8:07 PM  Result Value Ref Range Status   MRSA by PCR NEGATIVE NEGATIVE Final    Comment:        The GeneXpert MRSA Assay (FDA approved for NASAL specimens only), is one component of a comprehensive MRSA colonization surveillance program. It is not intended to diagnose MRSA infection nor to guide or monitor treatment for MRSA infections.      Studies: No results found.  Scheduled Meds: . aspirin EC  81 mg Oral Daily  . atorvastatin  80 mg Oral Daily  . carvedilol  12.5 mg Oral  BID WC  . enoxaparin (LOVENOX) injection  40 mg Subcutaneous Q24H  . fluticasone  1 spray Each Nare Daily  . furosemide  80 mg Intravenous 3 times per day  . Influenza vac split quadrivalent PF  0.5 mL Intramuscular Tomorrow-1000  . insulin aspart  0-15 Units Subcutaneous TID WC  . insulin aspart  0-5 Units Subcutaneous QHS  . insulin glargine  60 Units Subcutaneous QHS  . isosorbide-hydrALAZINE  0.5 tablet Oral TID  . lisinopril  5 mg Oral BID  . potassium chloride  20 mEq Oral BID  . sodium chloride  3 mL Intravenous Q12H  . spironolactone  12.5 mg Oral Daily   Continuous Infusions:    Charlynne Cousins  [facility] Pager [phone] If 8PM-8AM, please contact night-coverage at [email or web address], password [facility] 07/23/2014, 11:48 AM  LOS: 4 days

## 2014-07-23 NOTE — Progress Notes (Signed)
Hypoglycemic Event  CBG: 65  Treatment: 15 GM carbohydrate snack  Symptoms: None  Follow-up CBG: Time:0709 CBG Result:92  Possible Reasons for Event: Unknown   Marcus Johnson  Remember to initiate Hypoglycemia Order Set & complete

## 2014-07-23 NOTE — Progress Notes (Signed)
Patient ID: Marcus Johnson, male   DOB: 12/26/1960, 53 y.o.   MRN: [id number]   SUBJECTIVE: No complaints this morning.  He diuresed reasonably well over the last day.     Scheduled Meds: . aspirin EC  81 mg Oral Daily  . atorvastatin  80 mg Oral Daily  . carvedilol  12.5 mg Oral BID WC  . enoxaparin (LOVENOX) injection  40 mg Subcutaneous Q24H  . fluticasone  1 spray Each Nare Daily  . furosemide  80 mg Intravenous 3 times per day  . Influenza vac split quadrivalent PF  0.5 mL Intramuscular Tomorrow-1000  . insulin aspart  0-15 Units Subcutaneous TID WC  . insulin aspart  0-5 Units Subcutaneous QHS  . insulin glargine  60 Units Subcutaneous QHS  . isosorbide-hydrALAZINE  0.5 tablet Oral TID  . lisinopril  5 mg Oral BID  . metolazone  2.5 mg Oral Once  . potassium chloride  20 mEq Oral BID  . sodium chloride  3 mL Intravenous Q12H  . spironolactone  12.5 mg Oral Daily   Continuous Infusions:  PRN Meds:.sodium chloride, acetaminophen, ondansetron (ZOFRAN) IV, sodium chloride    Filed Vitals:   07/22/14 0353 07/22/14 1346 07/22/14 2110 07/23/14 0644  BP: 112/70 109/59 110/74 123/83  Pulse: 73 74 71 75  Temp: 97.5 F (36.4 C) 97.6 F (36.4 C) 98.1 F (36.7 C) 98.1 F (36.7 C)  TempSrc: Oral Oral Oral Oral  Resp: 18 18 18 18   Height:      Weight:    241 lb 3.2 oz (109.408 kg)  SpO2: 100% 100% 100% 100%    Intake/Output Summary (Last 24 hours) at 07/23/14 0917 Last data filed at 07/23/14 0646  Gross per 24 hour  Intake    240 ml  Output   2725 ml  Net  -2485 ml    LABS: Basic Metabolic Panel:  Recent Labs  07/22/14 0330 07/23/14 0455  NA 140 143  K 4.3 4.5  CL 103 105  CO2 23 26  GLUCOSE 165* 88  BUN 35* 39*  CREATININE 1.08 1.17  CALCIUM 9.0 9.3   Liver Function Tests: No results for input(s): AST, ALT, ALKPHOS, BILITOT, PROT, ALBUMIN in the last 72 hours. No results for input(s): LIPASE, AMYLASE in the last 72 hours. CBC: No results for input(s):  WBC, NEUTROABS, HGB, HCT, MCV, PLT in the last 72 hours. Cardiac Enzymes: No results for input(s): CKTOTAL, CKMB, CKMBINDEX, TROPONINI in the last 72 hours. BNP: Invalid input(s): POCBNP D-Dimer: No results for input(s): DDIMER in the last 72 hours. Hemoglobin A1C: No results for input(s): HGBA1C in the last 72 hours. Fasting Lipid Panel: No results for input(s): CHOL, HDL, LDLCALC, TRIG, CHOLHDL, LDLDIRECT in the last 72 hours. Thyroid Function Tests: No results for input(s): TSH, T4TOTAL, T3FREE, THYROIDAB in the last 72 hours.  Invalid input(s): FREET3 Anemia Panel: No results for input(s): VITAMINB12, FOLATE, FERRITIN, TIBC, IRON, RETICCTPCT in the last 72 hours.  RADIOLOGY: Dg Chest 2 View  07/19/2014   CLINICAL DATA:  Shortness of breath for 1 month.  EXAM: CHEST  2 VIEW  COMPARISON:  PA and lateral chest 10/21/2013 and 01/03/2012. CT chest 11/05/2008.  FINDINGS: The patient is status post CABG with an AICD in place. There is cardiomegaly but no edema. The lungs are clear. No pneumothorax or pleural effusion.  IMPRESSION: Cardiomegaly without acute disease.   Electronically Signed   By: Inge Rise M.D.   On: 07/19/2014 17:01   Ct  Outside Films Body  07/19/2014   This examination belongs to an outside facility and is stored  here for comparison purposes only.  Contact the originating outside  institution for any associated report or interpretation.   PHYSICAL EXAM General: NAD Neck: JVP 14 cm, no thyromegaly or thyroid nodule.  Lungs: Clear to auscultation bilaterally with normal respiratory effort. CV: Nondisplaced PMI.  Heart regular S1/S2, no S3/S4, no murmur.  2+ edema to thighs.   Abdomen: Soft, nontender, no hepatosplenomegaly, mild distention.  Neurologic: Alert and oriented x 3.  Psych: Normal affect. Extremities: No clubbing or cyanosis.   TELEMETRY: Reviewed telemetry pt in NSR  ASSESSMENT AND PLAN: 1. Acute on chronic systolic CHF: Ischemic  cardiomyopathy, EF 20-25% this admit. He is very volume overloaded, this has built up gradually over time. NYHA class IIIb symptoms. He needs a lot more diuresis. Has Medtronic ICD. Creatinine relatively stable with diuresis so far.  - Continue Lasix 80 mg IV every 8 hrs and will give a dose of metolazone today.  - Can continue current Coreg, Bidil, spironolactone, and lisinopril.  - When more euvolemic, will consider RHC.  - After discharge should followup in CHF clinic.  2. CAD: s/p CABG. No ACS this admission. Last cath 2013 with patent grafts. No chest pain. Continue ASA and statin.  3. Diabetes: per primary service.   Loralie Champagne 07/23/2014 9:17 AM

## 2014-07-24 DIAGNOSIS — I5021 Acute systolic (congestive) heart failure: Secondary | ICD-10-CM

## 2014-07-24 DIAGNOSIS — I255 Ischemic cardiomyopathy: Secondary | ICD-10-CM

## 2014-07-24 DIAGNOSIS — R0600 Dyspnea, unspecified: Secondary | ICD-10-CM

## 2014-07-24 DIAGNOSIS — R600 Localized edema: Secondary | ICD-10-CM

## 2014-07-24 LAB — BASIC METABOLIC PANEL
Anion gap: 14 (ref 5–15)
BUN: 47 mg/dL — AB (ref 6–23)
CHLORIDE: 99 meq/L (ref 96–112)
CO2: 25 mEq/L (ref 19–32)
CREATININE: 1.38 mg/dL — AB (ref 0.50–1.35)
Calcium: 9.6 mg/dL (ref 8.4–10.5)
GFR calc Af Amer: 66 mL/min — ABNORMAL LOW (ref >90)
GFR calc non Af Amer: 57 mL/min — ABNORMAL LOW (ref >90)
Glucose, Bld: 194 mg/dL — ABNORMAL HIGH (ref 70–99)
Potassium: 4.5 mEq/L (ref 3.7–5.3)
Sodium: 138 mEq/L (ref 137–147)

## 2014-07-24 LAB — GLUCOSE, CAPILLARY
GLUCOSE-CAPILLARY: 124 mg/dL — AB (ref 70–99)
GLUCOSE-CAPILLARY: 204 mg/dL — AB (ref 70–99)
Glucose-Capillary: 113 mg/dL — ABNORMAL HIGH (ref 70–99)
Glucose-Capillary: 134 mg/dL — ABNORMAL HIGH (ref 70–99)

## 2014-07-24 MED ORDER — FUROSEMIDE 10 MG/ML IJ SOLN
40.0000 mg | Freq: Three times a day (TID) | INTRAMUSCULAR | Status: DC
  Administered 2014-07-24 – 2014-07-29 (×15): 40 mg via INTRAVENOUS
  Filled 2014-07-24 (×17): qty 4

## 2014-07-24 NOTE — Progress Notes (Signed)
Patient ID: Marcus Johnson, male   DOB: 03/12/1961, 53 y.o.   MRN: [id number]   SUBJECTIVE: No complaints this morning.  He diuresed reasonably well over the last day. Minimal subjective improvement in LE edema.   Scheduled Meds: . aspirin EC  81 mg Oral Daily  . atorvastatin  80 mg Oral Daily  . carvedilol  12.5 mg Oral BID WC  . enoxaparin (LOVENOX) injection  40 mg Subcutaneous Q24H  . fluticasone  1 spray Each Nare Daily  . furosemide  80 mg Intravenous 3 times per day  . Influenza vac split quadrivalent PF  0.5 mL Intramuscular Tomorrow-1000  . insulin aspart  0-15 Units Subcutaneous TID WC  . insulin aspart  0-5 Units Subcutaneous QHS  . insulin glargine  60 Units Subcutaneous QHS  . isosorbide-hydrALAZINE  0.5 tablet Oral TID  . lisinopril  5 mg Oral BID  . potassium chloride  20 mEq Oral BID  . sodium chloride  3 mL Intravenous Q12H  . spironolactone  12.5 mg Oral Daily   Continuous Infusions:  PRN Meds:.sodium chloride, acetaminophen, ondansetron (ZOFRAN) IV, sodium chloride    Filed Vitals:   07/23/14 1952 07/23/14 2220 07/24/14 0413 07/24/14 0857  BP: 105/64 110/68 97/63 115/70  Pulse: 66  71 77  Temp: 98.4 F (36.9 C)  97.5 F (36.4 C)   TempSrc: Oral  Oral   Resp: 19     Height:      Weight:   237 lb 4.8 oz (107.639 kg)   SpO2: 100%  100%     Intake/Output Summary (Last 24 hours) at 07/24/14 1025 Last data filed at 07/24/14 1024  Gross per 24 hour  Intake   1840 ml  Output   2950 ml  Net  -1110 ml    LABS: Basic Metabolic Panel:  Recent Labs  07/23/14 0455 07/24/14 0248  NA 143 138  K 4.5 4.5  CL 105 99  CO2 26 25  GLUCOSE 88 194*  BUN 39* 47*  CREATININE 1.17 1.38*  CALCIUM 9.3 9.6   RADIOLOGY: Dg Chest 2 View  07/19/2014   CLINICAL DATA:  Shortness of breath for 1 month.  EXAM: CHEST  2 VIEW  COMPARISON:  PA and lateral chest 10/21/2013 and 01/03/2012. CT chest 11/05/2008.  FINDINGS: The patient is status post CABG with an AICD in  place. There is cardiomegaly but no edema. The lungs are clear. No pneumothorax or pleural effusion.  IMPRESSION: Cardiomegaly without acute disease.   Electronically Signed   By: Inge Rise M.D.   On: 07/19/2014 17:01    PHYSICAL EXAM General: NAD Neck: JVP 10 cm, no thyromegaly or thyroid nodule.  Lungs: Clear to auscultation bilaterally with normal respiratory effort. CV: Nondisplaced PMI.  Heart regular S1/S2, no S3/S4, no murmur.  2+ edema to thighs.   Abdomen: Soft, nontender, no hepatosplenomegaly, mild distention.  Neurologic: Alert and oriented x 3.  Psych: Normal affect. Extremities: No clubbing or cyanosis.   TELEMETRY: Reviewed telemetry pt in NSR   ASSESSMENT AND PLAN:  1. Acute on chronic systolic CHF: Ischemic cardiomyopathy, EF 20-25% this admit. He is very volume overloaded, this has built up gradually over time. NYHA class IIIb symptoms. He needs a lot more diuresis. Has Medtronic ICD.  - Creatinine started to rise, we will cut lasix to 40 mg IV every 8 hrs, hold metolazone.  - Can continue current Coreg, Bidil, spironolactone, and lisinopril.  - When more euvolemic, will consider RHC - After  discharge should followup in CHF clinic.  2. CAD: s/p CABG. No ACS this admission. Last cath 2013 with patent grafts. No chest pain. Continue ASA and statin.  3. Diabetes: per primary service.   Dorothy Spark 07/24/2014 10:25 AM

## 2014-07-24 NOTE — Progress Notes (Signed)
[facility] PROGRESS NOTE Interim History: 53 year old African-American male with past medical history of ischemic cardio myopathy and congestive heart failure with low ejection fraction has had slowly increased lower extremity swelling in the past month with increasing orthopnea and shortness of breath on exertion who was admitted to the hospitalist service on 12/14. On admission he was found to be 30 pounds from previous weigh-in.  Filed Weights   07/22/14 0300 07/23/14 0644 07/24/14 0413  Weight: 110.3 kg (243 lb 2.7 oz) 109.408 kg (241 lb 3.2 oz) 107.639 kg (237 lb 4.8 oz)        Intake/Output Summary (Last 24 hours) at 07/24/14 1043 Last data filed at 07/24/14 1024  Gross per 24 hour  Intake   1840 ml  Output   2950 ml  Net  -1110 ml     Assessment/Plan: Acute systolic congestive heart failure - On IV lasix, with brisk diuresis. This is was adjusted by cardiology given his worsened renal function, decrease Lasix, stopped metolazone. - cont to monitor electrolytes. - weight cont to improve, 110->107.6 kg, -8000 since admission ,Cont strict I and os. - continue current Coreg, Bidil, spironolactone, and lisinopril.  - on 3.23.2015 EDW 89.6 kg.   Obesity (BMI 30-39.9) - counseling   DM (diabetes mellitus) type II controlled with renal manifestation:  - CBGs Well-controlled, under 200  Acute renal failure -Most likely related to diuresis, will continue to monitor closely, diuresis adjusted by cardiology. -Continue to monitor closely, as on diuresis, lisinopril.  Cardiomyopathy, ischemic/CAD, status post AICD: On statin, aspirin, cardiology on board.     Code Status: full Family Communication: none  Disposition Plan: inpatient   Consultants:  cardiology  Procedures: ECHO: none  Antibiotics:  None  HPI/Subjective: Breathing is better  Objective: Filed Vitals:   07/23/14 1952 07/23/14 2220 07/24/14 0413 07/24/14 0857  BP: 105/64 110/68 97/63  115/70  Pulse: 66  71 77  Temp: 98.4 F (36.9 C)  97.5 F (36.4 C)   TempSrc: Oral  Oral   Resp: 19     Height:      Weight:   107.639 kg (237 lb 4.8 oz)   SpO2: 100%  100%      Exam:  General: Alert, awake, oriented x3, in no acute distress.  HEENT: No bruits, no goiter. +JVD Heart: Regular rate and rhythm. Lungs: Good air movement, bilateral air movement.  Abdomen: Soft, nontender, nondistended, positive bowel sounds.  Extremities: +3 edema bilaterally Data Reviewed: Basic Metabolic Panel:  Recent Labs Lab 07/20/14 0117 07/21/14 0232 07/22/14 0330 07/23/14 0455 07/24/14 0248  NA 140 137 140 143 138  K 4.1 4.0 4.3 4.5 4.5  CL 104 100 103 105 99  CO2 22 22 23 26 25   GLUCOSE 187* 175* 165* 88 194*  BUN 28* 31* 35* 39* 47*  CREATININE 0.97 0.96 1.08 1.17 1.38*  CALCIUM 9.1 9.0 9.0 9.3 9.6   Liver Function Tests:  Recent Labs Lab 07/20/14 0117  AST 43*  ALT 29  ALKPHOS 216*  BILITOT 1.7*  PROT 7.8  ALBUMIN 2.9*   No results for input(s): LIPASE, AMYLASE in the last 168 hours. No results for input(s): AMMONIA in the last 168 hours. CBC:  Recent Labs Lab 07/19/14 1512 07/20/14 0117  WBC 8.4 7.4  NEUTROABS  --  4.7  HGB 12.2* 11.4*  HCT 38.7* 36.3*  MCV 78.3 76.1*  PLT 221 226   Cardiac Enzymes:  Recent Labs Lab 07/19/14 2010 07/20/14 0117 07/20/14 0830  TROPONINI <0.30 <0.30 <0.30   BNP (last 3 results)  Recent Labs  10/19/13 1903 07/19/14 1512  PROBNP 1605.0* 730.5*   CBG:  Recent Labs Lab 07/23/14 0709 07/23/14 1132 07/23/14 1614 07/23/14 2129 07/24/14 0604  GLUCAP 92 149* 162* 128* 113*    Recent Results (from the past 240 hour(s))  MRSA PCR Screening     Status: None   Collection Time: 07/19/14  8:07 PM  Result Value Ref Range Status   MRSA by PCR NEGATIVE NEGATIVE Final    Comment:        The GeneXpert MRSA Assay (FDA approved for NASAL specimens only), is one component of a comprehensive MRSA  colonization surveillance program. It is not intended to diagnose MRSA infection nor to guide or monitor treatment for MRSA infections.      Studies: No results found.  Scheduled Meds: . aspirin EC  81 mg Oral Daily  . atorvastatin  80 mg Oral Daily  . carvedilol  12.5 mg Oral BID WC  . enoxaparin (LOVENOX) injection  40 mg Subcutaneous Q24H  . fluticasone  1 spray Each Nare Daily  . furosemide  40 mg Intravenous 3 times per day  . Influenza vac split quadrivalent PF  0.5 mL Intramuscular Tomorrow-1000  . insulin aspart  0-15 Units Subcutaneous TID WC  . insulin aspart  0-5 Units Subcutaneous QHS  . insulin glargine  60 Units Subcutaneous QHS  . isosorbide-hydrALAZINE  0.5 tablet Oral TID  . lisinopril  5 mg Oral BID  . potassium chloride  20 mEq Oral BID  . sodium chloride  3 mL Intravenous Q12H  . spironolactone  12.5 mg Oral Daily   Continuous Infusions:    Marcus Johnson  [facility] Pager [phone] If 8PM-8AM, please contact night-coverage at [email or web address], password [facility] 07/24/2014, 10:43 AM  LOS: 5 days

## 2014-07-25 LAB — GLUCOSE, CAPILLARY
Glucose-Capillary: 86 mg/dL (ref 70–99)
Glucose-Capillary: 99 mg/dL (ref 70–99)
Glucose-Capillary: 106 mg/dL — ABNORMAL HIGH (ref 70–99)
Glucose-Capillary: 177 mg/dL — ABNORMAL HIGH (ref 70–99)

## 2014-07-25 LAB — CBC
HCT: 35.9 % — ABNORMAL LOW (ref 39.0–52.0)
HEMOGLOBIN: 11.2 g/dL — AB (ref 13.0–17.0)
MCH: 24.3 pg — ABNORMAL LOW (ref 26.0–34.0)
MCHC: 31.2 g/dL (ref 30.0–36.0)
MCV: 77.9 fL — ABNORMAL LOW (ref 78.0–100.0)
Platelets: 191 10*3/uL (ref 150–400)
RBC: 4.61 MIL/uL (ref 4.22–5.81)
RDW: 16.5 % — ABNORMAL HIGH (ref 11.5–15.5)
WBC: 5.9 10*3/uL (ref 4.0–10.5)

## 2014-07-25 LAB — BASIC METABOLIC PANEL
Anion gap: 15 (ref 5–15)
BUN: 52 mg/dL — ABNORMAL HIGH (ref 6–23)
CO2: 24 meq/L (ref 19–32)
Calcium: 9.5 mg/dL (ref 8.4–10.5)
Chloride: 102 mEq/L (ref 96–112)
Creatinine, Ser: 1.32 mg/dL (ref 0.50–1.35)
GFR calc Af Amer: 70 mL/min — ABNORMAL LOW (ref >90)
GFR calc non Af Amer: 60 mL/min — ABNORMAL LOW (ref >90)
Glucose, Bld: 125 mg/dL — ABNORMAL HIGH (ref 70–99)
POTASSIUM: 4.7 meq/L (ref 3.7–5.3)
SODIUM: 141 meq/L (ref 137–147)

## 2014-07-25 NOTE — Progress Notes (Signed)
[facility] PROGRESS NOTE Interim History: 53 year old African-American male with past medical history of ischemic cardiomyopathy and congestive heart failure with low ejection fraction has had slowly increased lower extremity swelling in the past month with increasing orthopnea and shortness of breath on exertion who was admitted to the hospitalist service on 12/14. On admission he was found to be 30 pounds from previous weigh-in. Was started on IV Lasix, doses were adjusted giving his slight worsening of his renal function, has significant symptomatic improvement, but remains volume overload, the plan is for RHC on 12/21.  Filed Weights   07/23/14 0644 07/24/14 0413 07/25/14 0401  Weight: 109.408 kg (241 lb 3.2 oz) 107.639 kg (237 lb 4.8 oz) 105.87 kg (233 lb 6.4 oz)        Intake/Output Summary (Last 24 hours) at 07/25/14 1036 Last data filed at 07/25/14 0935  Gross per 24 hour  Intake    960 ml  Output   3050 ml  Net  -2090 ml     Assessment/Plan: Acute systolic congestive heart failure - On IV lasix, with good diuresis. This is was adjusted by cardiology given his worsened renal function, decrease Lasix, stopped metolazone. - Creatinine remained stable - weight cont to improve, 110->105.8 kg, -10020 since admission ,Cont strict I and os. - continue current Coreg, Bidil, spironolactone, and lisinopril.  - on 3.23.2015 EDW 89.6 kg.   Obesity (BMI 30-39.9) - counseling   DM (diabetes mellitus) type II controlled with renal manifestation:  - CBGs Well-controlled, under 200  Acute renal failure -Most likely related to diuresis, will continue to monitor closely, diuresis adjusted by cardiology. -Continue to monitor closely, as on diuresis, lisinopril.  Cardiomyopathy, ischemic/CAD, status post AICD:  -On statin, aspirin, cardiology on board. -plan is for right heart cath on 12/21     Code Status: full Family Communication: none  Disposition Plan:  inpatient   Consultants:  cardiology  Procedures: ECHO:   Antibiotics:  None  HPI/Subjective: Breathing is better  Objective: Filed Vitals:   07/24/14 1456 07/24/14 2041 07/25/14 0401 07/25/14 0932  BP: 115/58 109/68 138/62 105/55  Pulse: 76 72 72 69  Temp: 97.8 F (36.6 C) 97.7 F (36.5 C) 98.6 F (37 C)   TempSrc: Oral Oral Oral   Resp: 18 18 18    Height:      Weight:   105.87 kg (233 lb 6.4 oz)   SpO2: 100% 100% 100%      Exam:  General: Alert, awake, oriented x3, in no acute distress.  HEENT: No bruits, no goiter. +JVD Heart: Regular rate and rhythm. Lungs: Good air movement, bilateral air movement.  Abdomen: Soft, nontender, nondistended, positive bowel sounds.  Extremities: +3 edema bilaterally Data Reviewed: Basic Metabolic Panel:  Recent Labs Lab 07/21/14 0232 07/22/14 0330 07/23/14 0455 07/24/14 0248 07/25/14 0251  NA 137 140 143 138 141  K 4.0 4.3 4.5 4.5 4.7  CL 100 103 105 99 102  CO2 22 23 26 25 24   GLUCOSE 175* 165* 88 194* 125*  BUN 31* 35* 39* 47* 52*  CREATININE 0.96 1.08 1.17 1.38* 1.32  CALCIUM 9.0 9.0 9.3 9.6 9.5   Liver Function Tests:  Recent Labs Lab 07/20/14 0117  AST 43*  ALT 29  ALKPHOS 216*  BILITOT 1.7*  PROT 7.8  ALBUMIN 2.9*   No results for input(s): LIPASE, AMYLASE in the last 168 hours. No results for input(s): AMMONIA in the last 168 hours. CBC:  Recent Labs Lab 07/19/14 1512 07/20/14  0117 07/25/14 0251  WBC 8.4 7.4 5.9  NEUTROABS  --  4.7  --   HGB 12.2* 11.4* 11.2*  HCT 38.7* 36.3* 35.9*  MCV 78.3 76.1* 77.9*  PLT 221 226 191   Cardiac Enzymes:  Recent Labs Lab 07/19/14 2010 07/20/14 0117 07/20/14 0830  TROPONINI <0.30 <0.30 <0.30   BNP (last 3 results)  Recent Labs  10/19/13 1903 07/19/14 1512  PROBNP 1605.0* 730.5*   CBG:  Recent Labs Lab 07/24/14 0604 07/24/14 1113 07/24/14 1621 07/24/14 2050 07/25/14 0648  GLUCAP 113* 134* 124* 204* 86    Recent Results (from  the past 240 hour(s))  MRSA PCR Screening     Status: None   Collection Time: 07/19/14  8:07 PM  Result Value Ref Range Status   MRSA by PCR NEGATIVE NEGATIVE Final    Comment:        The GeneXpert MRSA Assay (FDA approved for NASAL specimens only), is one component of a comprehensive MRSA colonization surveillance program. It is not intended to diagnose MRSA infection nor to guide or monitor treatment for MRSA infections.      Studies: No results found.  Scheduled Meds: . atorvastatin  80 mg Oral Daily  . carvedilol  12.5 mg Oral BID WC  . enoxaparin (LOVENOX) injection  40 mg Subcutaneous Q24H  . fluticasone  1 spray Each Nare Daily  . furosemide  40 mg Intravenous 3 times per day  . Influenza vac split quadrivalent PF  0.5 mL Intramuscular Tomorrow-1000  . insulin aspart  0-15 Units Subcutaneous TID WC  . insulin aspart  0-5 Units Subcutaneous QHS  . insulin glargine  60 Units Subcutaneous QHS  . isosorbide-hydrALAZINE  0.5 tablet Oral TID  . lisinopril  5 mg Oral BID  . potassium chloride  20 mEq Oral BID  . sodium chloride  3 mL Intravenous Q12H  . spironolactone  12.5 mg Oral Daily   Time spent: 30 minutes  Jovanie Verge  [facility] Pager [phone] If 8PM-8AM, please contact night-coverage at [email or web address], password [facility] 07/25/2014, 10:36 AM  LOS: 6 days

## 2014-07-25 NOTE — Progress Notes (Addendum)
Pt up ambulating in hallway independently at this time; no needs voiced; pt informed of cardiac cath scheduled for tomorrow; pt hesitant about procedure, states no MD has informed him of procedure; will wait on obtaining consent; will cont. To monitor.

## 2014-07-25 NOTE — Progress Notes (Signed)
Patient ID: Marcus Johnson, male   DOB: 26-Nov-1960, 53 y.o.   MRN: [id number]   SUBJECTIVE: No complaints this morning.  He diuresed very well over the last day with decreased dose of lasix.   Scheduled Meds: . aspirin EC  81 mg Oral Daily  . atorvastatin  80 mg Oral Daily  . carvedilol  12.5 mg Oral BID WC  . enoxaparin (LOVENOX) injection  40 mg Subcutaneous Q24H  . fluticasone  1 spray Each Nare Daily  . furosemide  40 mg Intravenous 3 times per day  . Influenza vac split quadrivalent PF  0.5 mL Intramuscular Tomorrow-1000  . insulin aspart  0-15 Units Subcutaneous TID WC  . insulin aspart  0-5 Units Subcutaneous QHS  . insulin glargine  60 Units Subcutaneous QHS  . isosorbide-hydrALAZINE  0.5 tablet Oral TID  . lisinopril  5 mg Oral BID  . potassium chloride  20 mEq Oral BID  . sodium chloride  3 mL Intravenous Q12H  . spironolactone  12.5 mg Oral Daily   Continuous Infusions:  PRN Meds:.sodium chloride, acetaminophen, ondansetron (ZOFRAN) IV, sodium chloride  Filed Vitals:   07/24/14 1456 07/24/14 2041 07/25/14 0401 07/25/14 0932  BP: 115/58 109/68 138/62 105/55  Pulse: 76 72 72 69  Temp: 97.8 F (36.6 C) 97.7 F (36.5 C) 98.6 F (37 C)   TempSrc: Oral Oral Oral   Resp: 18 18 18    Height:      Weight:   233 lb 6.4 oz (105.87 kg)   SpO2: 100% 100% 100%     Intake/Output Summary (Last 24 hours) at 07/25/14 0935 Last data filed at 07/25/14 0935  Gross per 24 hour  Intake    960 ml  Output   3350 ml  Net  -2390 ml    LABS: Basic Metabolic Panel:  Recent Labs  07/24/14 0248 07/25/14 0251  NA 138 141  K 4.5 4.7  CL 99 102  CO2 25 24  GLUCOSE 194* 125*  BUN 47* 52*  CREATININE 1.38* 1.32  CALCIUM 9.6 9.5   RADIOLOGY: Dg Chest 2 View  07/19/2014   CLINICAL DATA:  Shortness of breath for 1 month.  EXAM: CHEST  2 VIEW  COMPARISON:  PA and lateral chest 10/21/2013 and 01/03/2012. CT chest 11/05/2008.  FINDINGS: The patient is status post CABG with an AICD in  place. There is cardiomegaly but no edema. The lungs are clear. No pneumothorax or pleural effusion.  IMPRESSION: Cardiomegaly without acute disease.   Electronically Signed   By: Inge Rise M.D.   On: 07/19/2014 17:01    PHYSICAL EXAM General: NAD Neck: JVP 10 cm, no thyromegaly or thyroid nodule.  Lungs: Clear to auscultation bilaterally with normal respiratory effort. CV: Nondisplaced PMI.  Heart regular S1/S2, no S3/S4, no murmur.  2+ edema to thighs.   Abdomen: Soft, nontender, no hepatosplenomegaly, mild distention.  Neurologic: Alert and oriented x 3.  Psych: Normal affect. Extremities: No clubbing or cyanosis.   TELEMETRY: Reviewed telemetry pt in NSR   ASSESSMENT AND PLAN:  1. Acute on chronic systolic CHF: Ischemic cardiomyopathy, EF 20-25% this admit. He is still volume overloaded, this has built up gradually over time. NYHA class IIIb symptoms. He needs a lot more diuresis. Has [employer] ICD.  - Yesterday Creatinine started to rise, we cut lasix to 40 mg IV every 8 hrs, held metolazone, he was able to diurese 2.3 L and down 4 lbs, now 233 lbs (baseline 220). Crea stable at  1.3 - Can continue current Coreg, Bidil, spironolactone, and lisinopril.  - We will schedule a RHC for tomorrow - After discharge should followup in CHF clinic.   2. CAD: s/p CABG. No ACS this admission. Last cath 2013 with patent grafts. No chest pain. Continue ASA and statin.   3. Diabetes: per primary service.   Dorothy Spark 07/25/2014 9:35 AM

## 2014-07-25 NOTE — Progress Notes (Signed)
MD made aware pt is refusing to have cath tomorrow; will cont. To monitor.

## 2014-07-25 NOTE — Progress Notes (Signed)
Discussed with patient need to have RHC in the am.  He refuses further invasive testing. States he has been through enough. Does not want to be "poked anymore." States he wishes to continue with medical management. Dr. Meda Coffee informed.

## 2014-07-26 ENCOUNTER — Encounter (HOSPITAL_COMMUNITY)
Admission: EM | Disposition: A | Discharge status: Home / Self Care | Payer: Self-pay | Source: Home / Self Care | Attending: Internal Medicine

## 2014-07-26 DIAGNOSIS — N183 Chronic kidney disease, stage 3 (moderate): Secondary | ICD-10-CM

## 2014-07-26 LAB — CBC
HCT: 34.2 % — ABNORMAL LOW (ref 39.0–52.0)
HEMOGLOBIN: 10.9 g/dL — AB (ref 13.0–17.0)
MCH: 24.5 pg — AB (ref 26.0–34.0)
MCHC: 31.9 g/dL (ref 30.0–36.0)
MCV: 76.9 fL — ABNORMAL LOW (ref 78.0–100.0)
Platelets: 180 10*3/uL (ref 150–400)
RBC: 4.45 MIL/uL (ref 4.22–5.81)
RDW: 16.4 % — AB (ref 11.5–15.5)
WBC: 6 10*3/uL (ref 4.0–10.5)

## 2014-07-26 LAB — GLUCOSE, CAPILLARY
GLUCOSE-CAPILLARY: 146 mg/dL — AB (ref 70–99)
Glucose-Capillary: 102 mg/dL — ABNORMAL HIGH (ref 70–99)
Glucose-Capillary: 110 mg/dL — ABNORMAL HIGH (ref 70–99)
Glucose-Capillary: 145 mg/dL — ABNORMAL HIGH (ref 70–99)

## 2014-07-26 LAB — BASIC METABOLIC PANEL
Anion gap: 13 (ref 5–15)
BUN: 53 mg/dL — AB (ref 6–23)
CALCIUM: 9.2 mg/dL (ref 8.4–10.5)
CO2: 26 mEq/L (ref 19–32)
Chloride: 102 mEq/L (ref 96–112)
Creatinine, Ser: 1.26 mg/dL (ref 0.50–1.35)
GFR, EST AFRICAN AMERICAN: 74 mL/min — AB (ref >90)
GFR, EST NON AFRICAN AMERICAN: 64 mL/min — AB (ref >90)
GLUCOSE: 109 mg/dL — AB (ref 70–99)
POTASSIUM: 4.5 meq/L (ref 3.7–5.3)
SODIUM: 141 meq/L (ref 137–147)

## 2014-07-26 SURGERY — RIGHT HEART CATH
Anesthesia: LOCAL

## 2014-07-26 MED ORDER — MILRINONE IN DEXTROSE 20 MG/100ML IV SOLN
0.2500 ug/kg/min | INTRAVENOUS | Status: DC
Start: 2014-07-26 — End: 2014-07-31
  Administered 2014-07-26 – 2014-07-30 (×7): 0.25 ug/kg/min via INTRAVENOUS
  Administered 2014-07-30: 0.125 ug/kg/min via INTRAVENOUS
  Administered 2014-07-31: 0.25 ug/kg/min via INTRAVENOUS
  Filled 2014-07-26 (×9): qty 100

## 2014-07-26 MED ORDER — CARVEDILOL 6.25 MG PO TABS
6.2500 mg | ORAL_TABLET | Freq: Two times a day (BID) | ORAL | Status: DC
  Administered 2014-07-26 – 2014-07-30 (×8): 6.25 mg via ORAL
  Filled 2014-07-26 (×10): qty 1

## 2014-07-26 NOTE — Progress Notes (Signed)
Patient ID: Marcus Johnson, male   DOB: [birth year], 53 y.o.   MRN: [id number]   SUBJECTIVE: Lasix cut back on 12/19 due to worsening renal function. Creatinine now stable. Weight dropping slowly but still with about 15 pounds of volume overload. Denies orthopnea or PND. Does not want RHC.   Scheduled Meds: . atorvastatin  80 mg Oral Daily  . carvedilol  12.5 mg Oral BID WC  . enoxaparin (LOVENOX) injection  40 mg Subcutaneous Q24H  . fluticasone  1 spray Each Nare Daily  . furosemide  40 mg Intravenous 3 times per day  . insulin aspart  0-15 Units Subcutaneous TID WC  . insulin aspart  0-5 Units Subcutaneous QHS  . insulin glargine  60 Units Subcutaneous QHS  . isosorbide-hydrALAZINE  0.5 tablet Oral TID  . lisinopril  5 mg Oral BID  . potassium chloride  20 mEq Oral BID  . sodium chloride  3 mL Intravenous Q12H  . spironolactone  12.5 mg Oral Daily   Continuous Infusions:  PRN Meds:.sodium chloride, acetaminophen, ondansetron (ZOFRAN) IV, sodium chloride  Filed Vitals:   07/25/14 0932 07/25/14 1500 07/25/14 2008 07/26/14 0338  BP: 105/55 114/68 104/56 125/72  Pulse: 69 77 68 75  Temp:  98.2 F (36.8 C) 97.6 F (36.4 C) 97.5 F (36.4 C)  TempSrc:  Oral Oral Oral  Resp:  18 18 18   Height:      Weight:    104.509 kg (230 lb 6.4 oz)  SpO2:  100% 100% 100%    Intake/Output Summary (Last 24 hours) at 07/26/14 1303 Last data filed at 07/26/14 0900  Gross per 24 hour  Intake   1200 ml  Output   2100 ml  Net   -900 ml    LABS: Basic Metabolic Panel:  Recent Labs  07/25/14 0251 07/26/14 0532  NA 141 141  K 4.7 4.5  CL 102 102  CO2 24 26  GLUCOSE 125* 109*  BUN 52* 53*  CREATININE 1.32 1.26  CALCIUM 9.5 9.2   RADIOLOGY: Dg Chest 2 View  07/19/2014   CLINICAL DATA:  Shortness of breath for 1 month.  EXAM: CHEST  2 VIEW  COMPARISON:  PA and lateral chest 10/21/2013 and 01/03/2012. CT chest 11/05/2008.  FINDINGS: The patient is status post CABG with an AICD in place.  There is cardiomegaly but no edema. The lungs are clear. No pneumothorax or pleural effusion.  IMPRESSION: Cardiomegaly without acute disease.   Electronically Signed   By: Inge Rise M.D.   On: 07/19/2014 17:01    PHYSICAL EXAM General: NAD Neck: JVP 10 cm, thyroid looks thick Lungs: Clear to auscultation bilaterally with normal respiratory effort. CV: Nondisplaced PMI.  Heart regular S1/S2, no S3/S4, no murmur.  2+ edema to thighs.   Abdomen: Soft, nontender, no hepatosplenomegaly,+ distention.  Neurologic: Alert and oriented x 3.  Psych: Normal affect. Extremities: No clubbing or cyanosis.   TELEMETRY: Reviewed telemetry pt in NSR   ASSESSMENT AND PLAN:  1. Acute on chronic systolic CHF: Ischemic cardiomyopathy, EF 20-25% this admit. He is still volume overloaded, this has built up gradually over time. NYHA class III symptoms.Has [employer] ICD.  -He has R>>L symptoms. Renal function now stable and diuresing slowly. Still with about 15 pounds of fluid to go. Will cut carvedilol to 6.25 bid and add low-dose milrinone to facilitate diuresis and try to get him home prior to Xmas.   2. CAD: s/p CABG. No ACS this admission. Last cath  2013 with patent grafts. No chest pain. Continue ASA and statin.   3. Diabetes: per primary service.   Marcus Johnson 07/26/2014 1:03 PM

## 2014-07-26 NOTE — Progress Notes (Signed)
[facility] PROGRESS NOTE Interim History: 53 year old African-American male with past medical history of ischemic cardiomyopathy and congestive heart failure with low ejection fraction has had slowly increased lower extremity swelling in the past month with increasing orthopnea and shortness of breath on exertion who was admitted to the hospitalist service on 12/14. On admission he was found to be 30 pounds from previous weigh-in. Was started on IV Lasix, doses were adjusted giving his slight worsening of his renal function, has significant symptomatic improvement, but remains volume overloaded, patient declined [facility] on 12/21. Started on milrinone to facilitate diuresis.  Filed Weights   07/24/14 0413 07/25/14 0401 07/26/14 0338  Weight: 107.639 kg (237 lb 4.8 oz) 105.87 kg (233 lb 6.4 oz) 104.509 kg (230 lb 6.4 oz)        Intake/Output Summary (Last 24 hours) at 07/26/14 1316 Last data filed at 07/26/14 0900  Gross per 24 hour  Intake   1200 ml  Output   2100 ml  Net   -900 ml     Assessment/Plan: Acute systolic congestive heart failure - On IV lasix, with good diuresis, diuresis is managed by cardiology, milrinone and added on 12/21. - Creatinine remained stable - weight cont to improve, 110->104.5 kg, -10620 since admission ,Cont strict I and os. - continue current Coreg( dose increased by cardiology 12/21), Bidil, spironolactone, and lisinopril.  - on 3.23.2015 EDW 89.6 kg.   Obesity (BMI 30-39.9) - counseling   DM (diabetes mellitus) type II controlled with renal manifestation:  - CBGs Well-controlled, under 200  Acute renal failure -Most likely related to diuresis, will continue to monitor closely, diuresis adjusted by cardiology. -Continue to monitor closely, as on diuresis, lisinopril.  Cardiomyopathy, ischemic/CAD, status post AICD:  -On statin, aspirin, cardiology on board.      Code Status: full Family Communication: none  Disposition Plan:  inpatient   Consultants:  cardiology  Procedures: ECHO:   Antibiotics:  None  HPI/Subjective: Breathing is better  Objective: Filed Vitals:   07/25/14 0932 07/25/14 1500 07/25/14 2008 07/26/14 0338  BP: 105/55 114/68 104/56 125/72  Pulse: 69 77 68 75  Temp:  98.2 F (36.8 C) 97.6 F (36.4 C) 97.5 F (36.4 C)  TempSrc:  Oral Oral Oral  Resp:  18 18 18   Height:      Weight:    104.509 kg (230 lb 6.4 oz)  SpO2:  100% 100% 100%     Exam:  General: Alert, awake, oriented x3, in no acute distress.  HEENT: No bruits, no goiter. +JVD Heart: Regular rate and rhythm. Lungs: Good air movement, bilateral air movement.  Abdomen: Soft, nontender, nondistended, positive bowel sounds.  Extremities: +3 edema bilaterally Data Reviewed: Basic Metabolic Panel:  Recent Labs Lab 07/22/14 0330 07/23/14 0455 07/24/14 0248 07/25/14 0251 07/26/14 0532  NA 140 143 138 141 141  K 4.3 4.5 4.5 4.7 4.5  CL 103 105 99 102 102  CO2 23 26 25 24 26   GLUCOSE 165* 88 194* 125* 109*  BUN 35* 39* 47* 52* 53*  CREATININE 1.08 1.17 1.38* 1.32 1.26  CALCIUM 9.0 9.3 9.6 9.5 9.2   Liver Function Tests:  Recent Labs Lab 07/20/14 0117  AST 43*  ALT 29  ALKPHOS 216*  BILITOT 1.7*  PROT 7.8  ALBUMIN 2.9*   No results for input(s): LIPASE, AMYLASE in the last 168 hours. No results for input(s): AMMONIA in the last 168 hours. CBC:  Recent Labs Lab 07/19/14 1512 07/20/14 0117 07/25/14 0251  07/26/14 0532  WBC 8.4 7.4 5.9 6.0  NEUTROABS  --  4.7  --   --   HGB 12.2* 11.4* 11.2* 10.9*  HCT 38.7* 36.3* 35.9* 34.2*  MCV 78.3 76.1* 77.9* 76.9*  PLT 221 226 191 180   Cardiac Enzymes:  Recent Labs Lab 07/19/14 2010 07/20/14 0117 07/20/14 0830  TROPONINI <0.30 <0.30 <0.30   BNP (last 3 results)  Recent Labs  10/19/13 1903 07/19/14 1512  PROBNP 1605.0* 730.5*   CBG:  Recent Labs Lab 07/25/14 1133 07/25/14 1627 07/25/14 2115 07/26/14 0559 07/26/14 1113  GLUCAP 99  106* 177* 102* 110*    Recent Results (from the past 240 hour(s))  MRSA PCR Screening     Status: None   Collection Time: 07/19/14  8:07 PM  Result Value Ref Range Status   MRSA by PCR NEGATIVE NEGATIVE Final    Comment:        The GeneXpert MRSA Assay (FDA approved for NASAL specimens only), is one component of a comprehensive MRSA colonization surveillance program. It is not intended to diagnose MRSA infection nor to guide or monitor treatment for MRSA infections.      Studies: No results found.  Scheduled Meds: . atorvastatin  80 mg Oral Daily  . carvedilol  6.25 mg Oral BID WC  . enoxaparin (LOVENOX) injection  40 mg Subcutaneous Q24H  . fluticasone  1 spray Each Nare Daily  . furosemide  40 mg Intravenous 3 times per day  . insulin aspart  0-15 Units Subcutaneous TID WC  . insulin aspart  0-5 Units Subcutaneous QHS  . insulin glargine  60 Units Subcutaneous QHS  . isosorbide-hydrALAZINE  0.5 tablet Oral TID  . lisinopril  5 mg Oral BID  . potassium chloride  20 mEq Oral BID  . sodium chloride  3 mL Intravenous Q12H  . spironolactone  12.5 mg Oral Daily   Time spent: 30 minutes  Sonal Dorwart  [facility] Pager [phone] If 8PM-8AM, please contact night-coverage at [email or web address], password [facility] 07/26/2014, 1:16 PM  LOS: 7 days

## 2014-07-27 LAB — MAGNESIUM: Magnesium: 2.6 mg/dL — ABNORMAL HIGH (ref 1.5–2.5)

## 2014-07-27 LAB — GLUCOSE, CAPILLARY
Glucose-Capillary: 79 mg/dL (ref 70–99)
Glucose-Capillary: 139 mg/dL — ABNORMAL HIGH (ref 70–99)
Glucose-Capillary: 90 mg/dL (ref 70–99)

## 2014-07-27 LAB — BASIC METABOLIC PANEL
ANION GAP: 5 (ref 5–15)
BUN: 47 mg/dL — AB (ref 6–23)
CO2: 29 mmol/L (ref 19–32)
CREATININE: 1.28 mg/dL (ref 0.50–1.35)
Calcium: 9.1 mg/dL (ref 8.4–10.5)
Chloride: 106 mEq/L (ref 96–112)
GFR calc non Af Amer: 62 mL/min — ABNORMAL LOW (ref >90)
GFR, EST AFRICAN AMERICAN: 72 mL/min — AB (ref >90)
Glucose, Bld: 125 mg/dL — ABNORMAL HIGH (ref 70–99)
POTASSIUM: 4.3 mmol/L (ref 3.5–5.1)
Sodium: 140 mmol/L (ref 135–145)

## 2014-07-27 LAB — CBC
HCT: 35 % — ABNORMAL LOW (ref 39.0–52.0)
HEMOGLOBIN: 10.8 g/dL — AB (ref 13.0–17.0)
MCH: 23.3 pg — AB (ref 26.0–34.0)
MCHC: 30.9 g/dL (ref 30.0–36.0)
MCV: 75.6 fL — AB (ref 78.0–100.0)
PLATELETS: 203 10*3/uL (ref 150–400)
RBC: 4.63 MIL/uL (ref 4.22–5.81)
RDW: 16.2 % — ABNORMAL HIGH (ref 11.5–15.5)
WBC: 6.6 10*3/uL (ref 4.0–10.5)

## 2014-07-27 MED ORDER — METOLAZONE 2.5 MG PO TABS
2.5000 mg | ORAL_TABLET | ORAL | Status: AC
  Administered 2014-07-27: 2.5 mg via ORAL
  Filled 2014-07-27: qty 1

## 2014-07-27 MED ORDER — ASPIRIN EC 81 MG PO TBEC
81.0000 mg | DELAYED_RELEASE_TABLET | Freq: Every day | ORAL | Status: DC
  Administered 2014-07-27 – 2014-08-02 (×7): 81 mg via ORAL
  Filled 2014-07-27 (×7): qty 1

## 2014-07-27 NOTE — Progress Notes (Signed)
[facility] PROGRESS NOTE Interim History: 53 year old African-American male with past medical history of ischemic cardiomyopathy and congestive heart failure with low ejection fraction has had slowly increased lower extremity swelling in the past month with increasing orthopnea and shortness of breath on exertion who was admitted to the hospitalist service on 12/14. On admission he was found to be 30 pounds from previous weigh-in. Was started on IV Lasix, doses were adjusted giving his slight worsening of his renal function, has significant symptomatic improvement, but remains volume overloaded, patient declined [place] on 12/21. Started on milrinone to facilitate diuresis.  Filed Weights   07/25/14 0401 07/26/14 0338 07/27/14 0236  Weight: 105.87 kg (233 lb 6.4 oz) 104.509 kg (230 lb 6.4 oz) 103.874 kg (229 lb)        Intake/Output Summary (Last 24 hours) at 07/27/14 0959 Last data filed at 07/27/14 0746  Gross per 24 hour  Intake 621.18 ml  Output   3350 ml  Net -2728.82 ml     Assessment/Plan: Acute systolic congestive heart failure - On IV lasix, with good diuresis, diuresis is managed by cardiology, milrinone and added on 12/21. - Creatinine remained stable - weight cont to improve, 110 on admission>103.8 kg today, -13648 since admission ,Cont strict I and os. - continue current Coreg( dose increased by cardiology 12/21), Bidil, spironolactone, and lisinopril.  - on 10/26/2013 EDW 89.6 kg.   Obesity (BMI 30-39.9) - counseling   DM (diabetes mellitus) type II controlled with renal manifestation:  - CBGs Well-controlled, under 200  Acute renal failure - Creatinine remains stable. -Most likely related to diuresis, will continue to monitor closely, diuresis adjusted by cardiology. -Continue to monitor closely, as on diuresis, lisinopril.  Cardiomyopathy, ischemic/CAD, status post AICD:  -On statin, aspirin, cardiology on board.      Code Status: full Family  Communication: none  Disposition Plan: Remains on telemetry   Consultants:  Cardiology  CHF team  Procedures: ECHO:   Antibiotics:  None  HPI/Subjective: Breathing is better  Objective: Filed Vitals:   07/26/14 2218 07/27/14 0236 07/27/14 0624 07/27/14 0746  BP: 139/69 132/63 138/76 135/77  Pulse: 80 88 86 86  Temp: 97.6 F (36.4 C) 97.5 F (36.4 C) 98.2 F (36.8 C)   TempSrc: Oral Oral Oral   Resp: 20 18 18    Height:      Weight:  103.874 kg (229 lb)    SpO2: 99% 100% 100%      Exam:  General: Alert, awake, oriented x3, in no acute distress.  HEENT: No bruits, no goiter. +JVD Heart: Regular rate and rhythm. Lungs: Good air movement, bilateral air movement.  Abdomen: Soft, nontender, nondistended, positive bowel sounds.  Extremities: +3 edema bilaterally Data Reviewed: Basic Metabolic Panel:  Recent Labs Lab 07/23/14 0455 07/24/14 0248 07/25/14 0251 07/26/14 0532 07/27/14 0534  NA 143 138 141 141 140  K 4.5 4.5 4.7 4.5 4.3  CL 105 99 102 102 106  CO2 26 25 24 26 29   GLUCOSE 88 194* 125* 109* 125*  BUN 39* 47* 52* 53* 47*  CREATININE 1.17 1.38* 1.32 1.26 1.28  CALCIUM 9.3 9.6 9.5 9.2 9.1  MG  --   --   --   --  2.6*   Liver Function Tests: No results for input(s): AST, ALT, ALKPHOS, BILITOT, PROT, ALBUMIN in the last 168 hours. No results for input(s): LIPASE, AMYLASE in the last 168 hours. No results for input(s): AMMONIA in the last 168 hours. CBC:  Recent  Labs Lab 07/25/14 0251 07/26/14 0532 07/27/14 0534  WBC 5.9 6.0 6.6  HGB 11.2* 10.9* 10.8*  HCT 35.9* 34.2* 35.0*  MCV 77.9* 76.9* 75.6*  PLT 191 180 203   Cardiac Enzymes: No results for input(s): CKTOTAL, CKMB, CKMBINDEX, TROPONINI in the last 168 hours. BNP (last 3 results)  Recent Labs  10/19/13 1903 07/19/14 1512  PROBNP 1605.0* 730.5*   CBG:  Recent Labs Lab 07/26/14 0559 07/26/14 1113 07/26/14 1609 07/26/14 2216 07/27/14 0622  GLUCAP 102* 110* 145* 146* 79      Recent Results (from the past 240 hour(s))  MRSA PCR Screening     Status: None   Collection Time: 07/19/14  8:07 PM  Result Value Ref Range Status   MRSA by PCR NEGATIVE NEGATIVE Final    Comment:        The GeneXpert MRSA Assay (FDA approved for NASAL specimens only), is one component of a comprehensive MRSA colonization surveillance program. It is not intended to diagnose MRSA infection nor to guide or monitor treatment for MRSA infections.      Studies: No results found.  Scheduled Meds: . atorvastatin  80 mg Oral Daily  . carvedilol  6.25 mg Oral BID WC  . enoxaparin (LOVENOX) injection  40 mg Subcutaneous Q24H  . fluticasone  1 spray Each Nare Daily  . furosemide  40 mg Intravenous 3 times per day  . insulin aspart  0-15 Units Subcutaneous TID WC  . insulin aspart  0-5 Units Subcutaneous QHS  . insulin glargine  60 Units Subcutaneous QHS  . isosorbide-hydrALAZINE  0.5 tablet Oral TID  . lisinopril  5 mg Oral BID  . potassium chloride  20 mEq Oral BID  . sodium chloride  3 mL Intravenous Q12H  . spironolactone  12.5 mg Oral Daily   Time spent: 30 minutes  Consuella Scurlock  [facility] Pager [phone] If 8PM-8AM, please contact night-coverage at [email or web address], password [facility] 07/27/2014, 9:59 AM  LOS: 8 days

## 2014-07-27 NOTE — Progress Notes (Signed)
I returned to reinforce education with Mr. Pita.  He has follow-up with the [facility] on 08/05/14 at 0940 am.  He is concerned with returning to work.  I reinforced the need for him to come for follow-up.  He seems to understand.  He has a map and directions to get to the clinic.

## 2014-07-27 NOTE — Progress Notes (Signed)
I was called to insert 2nd PIV however pt requested not to be stuck.   "They are putting the Lasix thru this line so I don't need to have another IV put in".   Checked with RN said that they will continue to pause the Milrinone and give the Lasix since he doesn't want another IV inserted.  Pt updated.

## 2014-07-27 NOTE — Progress Notes (Signed)
Patient ID: Marcus Johnson, male   DOB: [birth year], 53 y.o.   MRN: [id number]   SUBJECTIVE: Lasix cut back on 12/19 due to worsening renal function. Milrinone started yesterday to facilitate diuresis. Creatinine now stable. Urine output markedly picked up but weight down only 1 more pound.  Denies orthopnea or PND. Does not want RHC.   Scheduled Meds: . atorvastatin  80 mg Oral Daily  . carvedilol  6.25 mg Oral BID WC  . enoxaparin (LOVENOX) injection  40 mg Subcutaneous Q24H  . fluticasone  1 spray Each Nare Daily  . furosemide  40 mg Intravenous 3 times per day  . insulin aspart  0-15 Units Subcutaneous TID WC  . insulin aspart  0-5 Units Subcutaneous QHS  . insulin glargine  60 Units Subcutaneous QHS  . isosorbide-hydrALAZINE  0.5 tablet Oral TID  . lisinopril  5 mg Oral BID  . potassium chloride  20 mEq Oral BID  . sodium chloride  3 mL Intravenous Q12H  . spironolactone  12.5 mg Oral Daily   Continuous Infusions: . milrinone 0.25 mcg/kg/min (07/27/14 0043)   PRN Meds:.sodium chloride, acetaminophen, ondansetron (ZOFRAN) IV, sodium chloride  Filed Vitals:   07/27/14 0236 07/27/14 0624 07/27/14 0746 07/27/14 1047  BP: 132/63 138/76 135/77 126/64  Pulse: 88 86 86 80  Temp: 97.5 F (36.4 C) 98.2 F (36.8 C)  97.8 F (36.6 C)  TempSrc: Oral Oral  Oral  Resp: 18 18  18   Height:      Weight: 103.874 kg (229 lb)     SpO2: 100% 100%  99%    Intake/Output Summary (Last 24 hours) at 07/27/14 1323 Last data filed at 07/27/14 1200  Gross per 24 hour  Intake  654.2 ml  Output   4350 ml  Net -3695.8 ml    LABS: Basic Metabolic Panel:  Recent Labs  07/26/14 0532 07/27/14 0534  NA 141 140  K 4.5 4.3  CL 102 106  CO2 26 29  GLUCOSE 109* 125*  BUN 53* 47*  CREATININE 1.26 1.28  CALCIUM 9.2 9.1  MG  --  2.6*   RADIOLOGY: Dg Chest 2 View  07/19/2014   CLINICAL DATA:  Shortness of breath for 1 month.  EXAM: CHEST  2 VIEW  COMPARISON:  PA and lateral chest 10/21/2013  and 01/03/2012. CT chest 11/05/2008.  FINDINGS: The patient is status post CABG with an AICD in place. There is cardiomegaly but no edema. The lungs are clear. No pneumothorax or pleural effusion.  IMPRESSION: Cardiomegaly without acute disease.   Electronically Signed   By: Inge Rise M.D.   On: 07/19/2014 17:01    PHYSICAL EXAM General: NAD Neck: JVP 10 cm, thyroid looks thick Lungs: Clear to auscultation bilaterally with normal respiratory effort. CV: Nondisplaced PMI.  Heart regular S1/S2, no S3/S4, no murmur.  2+ edema to thighs.   Abdomen: Soft, nontender, no hepatosplenomegaly,+ distention.  Neurologic: Alert and oriented x 3.  Psych: Normal affect. Extremities: No clubbing or cyanosis.   TELEMETRY: Reviewed telemetry pt in NSR   ASSESSMENT AND PLAN:  1. Acute on chronic systolic CHF: Ischemic cardiomyopathy, EF 20-25% this admit. He is still volume overloaded, this has built up gradually over time. NYHA class III symptoms.Has [employer] ICD.  -He has R>>L symptoms. Diuresis has picked up with milrinone but still about 15 pounds of fluid to go. Will continue current lasix dose and add one dose metolazone.   2. CAD: s/p CABG. No ACS this admission.  Last cath 2013 with patent grafts. No chest pain. Continue ASA and statin.   3. Diabetes: per primary service.   Glori Bickers MD 07/27/2014 1:23 PM

## 2014-07-28 ENCOUNTER — Encounter (HOSPITAL_COMMUNITY): Payer: BC Managed Care – PPO

## 2014-07-28 LAB — BASIC METABOLIC PANEL
Anion gap: 8 (ref 5–15)
BUN: 39 mg/dL — AB (ref 6–23)
CALCIUM: 9.2 mg/dL (ref 8.4–10.5)
CHLORIDE: 102 meq/L (ref 96–112)
CO2: 28 mmol/L (ref 19–32)
CREATININE: 1.24 mg/dL (ref 0.50–1.35)
GFR calc Af Amer: 75 mL/min — ABNORMAL LOW (ref >90)
GFR calc non Af Amer: 65 mL/min — ABNORMAL LOW (ref >90)
Glucose, Bld: 106 mg/dL — ABNORMAL HIGH (ref 70–99)
Potassium: 4.1 mmol/L (ref 3.5–5.1)
Sodium: 138 mmol/L (ref 135–145)

## 2014-07-28 LAB — GLUCOSE, CAPILLARY
GLUCOSE-CAPILLARY: 177 mg/dL — AB (ref 70–99)
GLUCOSE-CAPILLARY: 62 mg/dL — AB (ref 70–99)
GLUCOSE-CAPILLARY: 114 mg/dL — AB (ref 70–99)
GLUCOSE-CAPILLARY: 155 mg/dL — AB (ref 70–99)
Glucose-Capillary: 104 mg/dL — ABNORMAL HIGH (ref 70–99)
Glucose-Capillary: 90 mg/dL (ref 70–99)

## 2014-07-28 MED ORDER — ISOSORB DINITRATE-HYDRALAZINE 20-37.5 MG PO TABS
1.0000 | ORAL_TABLET | Freq: Three times a day (TID) | ORAL | Status: DC
  Administered 2014-07-28 (×2): 1 via ORAL
  Filled 2014-07-28 (×5): qty 1

## 2014-07-28 NOTE — Progress Notes (Signed)
Inpatient Diabetes Program Recommendations  AACE/ADA: New Consensus Statement on Inpatient Glycemic Control (2013)  Target Ranges:  Prepandial:   less than 140 mg/dL      Peak postprandial:   less than 180 mg/dL (1-2 hours)      Critically ill patients:  140 - 180 mg/dL  Results for MARGUISE, MARTUCCI (MRN [id number]) as of 07/28/2014 09:12  Ref. Range 07/27/2014 06:22 07/27/2014 11:18 07/27/2014 17:02 07/28/2014 06:12  Glucose-Capillary Latest Range: 70-99 mg/dL 79 139 (H) 90 62 (L)   Inpatient Diabetes Program Recommendations Insulin - Basal: Decrease Lantus to 55 units  Thank you  Raoul Pitch BSN, RN,CDE Inpatient Diabetes Coordinator [phone] (team pager)

## 2014-07-28 NOTE — Progress Notes (Signed)
Hypoglycemic Event  CBG: 62   Treatment: oral fluids  Symptoms:n/a  Follow-up CBG: Time: 0645 CBG Result: 114  Possible Reasons for Event: n/a      Marcus Johnson D  Remember to initiate Hypoglycemia Order Set & complete

## 2014-07-28 NOTE — Progress Notes (Signed)
[facility] PROGRESS NOTE Interim History: 53 year old African-American male with past medical history of ischemic cardiomyopathy and congestive heart failure with low ejection fraction has had slowly increased lower extremity swelling in the past month with increasing orthopnea and shortness of breath on exertion who was admitted to the hospitalist service on 12/14. On admission he was found to be 30 pounds from previous weigh-in. Was started on IV Lasix, doses were adjusted giving his slight worsening of his renal function, has significant symptomatic improvement, but remains volume overloaded, patient declined [place] on 12/21. Started on milrinone to facilitate diuresis.  Filed Weights   07/26/14 0338 07/27/14 0236 07/28/14 0500  Weight: 104.509 kg (230 lb 6.4 oz) 103.874 kg (229 lb) 100.699 kg (222 lb)        Intake/Output Summary (Last 24 hours) at 07/28/14 1040 Last data filed at 07/28/14 1000  Gross per 24 hour  Intake 825.52 ml  Output   6651 ml  Net -5825.48 ml     Assessment/Plan: Acute systolic congestive heart failure - On IV lasix, with good diuresis, diuresis is managed by cardiology, milrinone and added on 12/21. - Creatinine  stable - weight cont to improve, 110 on admission>100 kg today, -19234 since admission ,Cont strict I and os. - continue current Coreg, Bidil, spironolactone, and lisinopril.  - on 10/26/2013 EDW 89.6 kg.   Obesity (BMI 30-39.9) - counseling   DM (diabetes mellitus) type II controlled with renal manifestation:  - CBGs Well-controlled, under 200  Acute renal failure - Creatinine remains stable. -Most likely related to diuresis, will continue to monitor closely, diuresis adjusted by cardiology. -Continue to monitor closely, as on diuresis, lisinopril.  Cardiomyopathy, ischemic/CAD, status post AICD:  -On statin, aspirin, cardiology on board.      Code Status: full Family Communication: none  Disposition Plan: Remains on  telemetry   Consultants:  Cardiology  CHF team  Procedures: ECHO:   Antibiotics:  None  HPI/Subjective: Breathing is better  Objective: Filed Vitals:   07/27/14 2108 07/28/14 0449 07/28/14 0500 07/28/14 0937  BP: 132/72 122/75  134/72  Pulse: 89 88    Temp: 98.4 F (36.9 C) 99.1 F (37.3 C)    TempSrc: Oral Oral    Resp: 18     Height:      Weight:   100.699 kg (222 lb)   SpO2: 100% 98%       Exam:  General: Alert, awake, oriented x3, in no acute distress.  HEENT: No bruits, no goiter. +JVD Heart: Regular rate and rhythm. Lungs: Good air movement, bilateral air movement.  Abdomen: Soft, nontender, nondistended, positive bowel sounds.  Extremities: +3 edema bilaterally Data Reviewed: Basic Metabolic Panel:  Recent Labs Lab 07/24/14 0248 07/25/14 0251 07/26/14 0532 07/27/14 0534 07/28/14 0335  NA 138 141 141 140 138  K 4.5 4.7 4.5 4.3 4.1  CL 99 102 102 106 102  CO2 25 24 26 29 28   GLUCOSE 194* 125* 109* 125* 106*  BUN 47* 52* 53* 47* 39*  CREATININE 1.38* 1.32 1.26 1.28 1.24  CALCIUM 9.6 9.5 9.2 9.1 9.2  MG  --   --   --  2.6*  --    Liver Function Tests: No results for input(s): AST, ALT, ALKPHOS, BILITOT, PROT, ALBUMIN in the last 168 hours. No results for input(s): LIPASE, AMYLASE in the last 168 hours. No results for input(s): AMMONIA in the last 168 hours. CBC:  Recent Labs Lab 07/25/14 0251 07/26/14 0532 07/27/14 0534  WBC  5.9 6.0 6.6  HGB 11.2* 10.9* 10.8*  HCT 35.9* 34.2* 35.0*  MCV 77.9* 76.9* 75.6*  PLT 191 180 203   Cardiac Enzymes: No results for input(s): CKTOTAL, CKMB, CKMBINDEX, TROPONINI in the last 168 hours. BNP (last 3 results)  Recent Labs  10/19/13 1903 07/19/14 1512  PROBNP 1605.0* 730.5*   CBG:  Recent Labs Lab 07/26/14 2216 07/27/14 0622 07/27/14 1118 07/27/14 1702 07/28/14 0612  GLUCAP 146* 79 139* 90 62*    Recent Results (from the past 240 hour(s))  MRSA PCR Screening     Status: None    Collection Time: 07/19/14  8:07 PM  Result Value Ref Range Status   MRSA by PCR NEGATIVE NEGATIVE Final    Comment:        The GeneXpert MRSA Assay (FDA approved for NASAL specimens only), is one component of a comprehensive MRSA colonization surveillance program. It is not intended to diagnose MRSA infection nor to guide or monitor treatment for MRSA infections.      Studies: No results found.  Scheduled Meds: . aspirin EC  81 mg Oral Daily  . atorvastatin  80 mg Oral Daily  . carvedilol  6.25 mg Oral BID WC  . enoxaparin (LOVENOX) injection  40 mg Subcutaneous Q24H  . fluticasone  1 spray Each Nare Daily  . furosemide  40 mg Intravenous 3 times per day  . insulin aspart  0-15 Units Subcutaneous TID WC  . insulin aspart  0-5 Units Subcutaneous QHS  . insulin glargine  60 Units Subcutaneous QHS  . isosorbide-hydrALAZINE  1 tablet Oral TID  . lisinopril  5 mg Oral BID  . potassium chloride  20 mEq Oral BID  . sodium chloride  3 mL Intravenous Q12H  . spironolactone  12.5 mg Oral Daily   Time spent: 30 minutes  Alexes Menchaca  [facility] Pager [phone] If 8PM-8AM, please contact night-coverage at [email or web address], password [facility] 07/28/2014, 10:40 AM  LOS: 9 days

## 2014-07-28 NOTE — Progress Notes (Signed)
Patient ID: Marcus Johnson, male   DOB: 06-11-1961, 53 y.o.   MRN: [id number]   SUBJECTIVE: Remains on milrinone 0.25 mcg and IV lasix. Having brisk diuresis, weight down 7 lbs and 24 hr I/O -4.7 liters. Denies SOB, orthopnea or CP. Does not want RHC.    Scheduled Meds: . aspirin EC  81 mg Oral Daily  . atorvastatin  80 mg Oral Daily  . carvedilol  6.25 mg Oral BID WC  . enoxaparin (LOVENOX) injection  40 mg Subcutaneous Q24H  . fluticasone  1 spray Each Nare Daily  . furosemide  40 mg Intravenous 3 times per day  . insulin aspart  0-15 Units Subcutaneous TID WC  . insulin aspart  0-5 Units Subcutaneous QHS  . insulin glargine  60 Units Subcutaneous QHS  . isosorbide-hydrALAZINE  0.5 tablet Oral TID  . lisinopril  5 mg Oral BID  . potassium chloride  20 mEq Oral BID  . sodium chloride  3 mL Intravenous Q12H  . spironolactone  12.5 mg Oral Daily   Continuous Infusions: . milrinone 0.25 mcg/kg/min (07/28/14 0446)   PRN Meds:.sodium chloride, acetaminophen, ondansetron (ZOFRAN) IV, sodium chloride  Filed Vitals:   07/27/14 2108 07/28/14 0449 07/28/14 0500 07/28/14 0937  BP: 132/72 122/75  134/72  Pulse: 89 88    Temp: 98.4 F (36.9 C) 99.1 F (37.3 C)    TempSrc: Oral Oral    Resp: 18     Height:      Weight:   222 lb (100.699 kg)   SpO2: 100% 98%      Intake/Output Summary (Last 24 hours) at 07/28/14 0940 Last data filed at 07/28/14 0447  Gross per 24 hour  Intake 825.52 ml  Output   5651 ml  Net -4825.48 ml    LABS: Basic Metabolic Panel:  Recent Labs  07/27/14 0534 07/28/14 0335  NA 140 138  K 4.3 4.1  CL 106 102  CO2 29 28  GLUCOSE 125* 106*  BUN 47* 39*  CREATININE 1.28 1.24  CALCIUM 9.1 9.2  MG 2.6*  --    RADIOLOGY: Dg Chest 2 View  07/19/2014   CLINICAL DATA:  Shortness of breath for 1 month.  EXAM: CHEST  2 VIEW  COMPARISON:  PA and lateral chest 10/21/2013 and 01/03/2012. CT chest 11/05/2008.  FINDINGS: The patient is status post CABG with an  AICD in place. There is cardiomegaly but no edema. The lungs are clear. No pneumothorax or pleural effusion.  IMPRESSION: Cardiomegaly without acute disease.   Electronically Signed   By: Inge Rise M.D.   On: 07/19/2014 17:01    PHYSICAL EXAM General: NAD Neck: JVP difficult to assess d/t body habitus appears 10 cm, thyroid looks thick Lungs: Clear to auscultation bilaterally with normal respiratory effort. CV: Nondisplaced PMI.  Heart regular S1/S2, no S3/S4, no murmur.  1-2+ edema to thighs, TED hose intact.  Abdomen: Soft, nontender, no hepatosplenomegaly,+ distention.  Neurologic: Alert and oriented x 3.  Psych: Normal affect. Extremities: No clubbing or cyanosis.   TELEMETRY: Reviewed telemetry pt in NSR   ASSESSMENT AND PLAN:  1. Acute on chronic systolic CHF: Ischemic cardiomyopathy, EF 20-25% this admit.Volume status improving and weight is trending down. He still has quite a bit of volume on board. Will continue milrinone, IV lasix and metolazone.. Renal function stable and will continue to follow. SBP 130s will increase BiDil to 1 tab TID.   2. CAD: s/p CABG. No ACS this admission. Last cath  2013 with patent grafts. No chest pain. Continue ASA and statin.   3. Diabetes: per primary service.   Rande Brunt NP-C 07/28/2014 9:40 AM  Patient seen and examined with Junie Bame, NP. We discussed all aspects of the encounter. I agree with the assessment and plan as stated above.   Improving with current regimen. Will continue milrinone and IV lasix. Has at least 10 pounds to go probably. Will then wean milrinone slowly. Explained that need for milrinone signifies more advanced HF. Will need CPX after d/c.   Yuriko Portales,MD 10:31 AM

## 2014-07-29 LAB — GLUCOSE, CAPILLARY
GLUCOSE-CAPILLARY: 119 mg/dL — AB (ref 70–99)
GLUCOSE-CAPILLARY: 111 mg/dL — AB (ref 70–99)
Glucose-Capillary: 123 mg/dL — ABNORMAL HIGH (ref 70–99)
Glucose-Capillary: 72 mg/dL (ref 70–99)
Glucose-Capillary: 115 mg/dL — ABNORMAL HIGH (ref 70–99)

## 2014-07-29 LAB — BASIC METABOLIC PANEL
Anion gap: 10 (ref 5–15)
BUN: 36 mg/dL — AB (ref 6–23)
CO2: 24 mmol/L (ref 19–32)
Calcium: 9.4 mg/dL (ref 8.4–10.5)
Chloride: 103 mEq/L (ref 96–112)
Creatinine, Ser: 1.19 mg/dL (ref 0.50–1.35)
GFR calc Af Amer: 79 mL/min — ABNORMAL LOW (ref >90)
GFR calc non Af Amer: 68 mL/min — ABNORMAL LOW (ref >90)
Glucose, Bld: 97 mg/dL (ref 70–99)
Potassium: 4.2 mmol/L (ref 3.5–5.1)
SODIUM: 137 mmol/L (ref 135–145)

## 2014-07-29 LAB — MAGNESIUM: Magnesium: 2.5 mg/dL (ref 1.5–2.5)

## 2014-07-29 MED ORDER — FUROSEMIDE 10 MG/ML IJ SOLN
40.0000 mg | Freq: Two times a day (BID) | INTRAMUSCULAR | Status: DC
  Administered 2014-07-29 – 2014-07-30 (×2): 40 mg via INTRAVENOUS
  Filled 2014-07-29 (×2): qty 4

## 2014-07-29 MED ORDER — ISOSORB DINITRATE-HYDRALAZINE 20-37.5 MG PO TABS
2.0000 | ORAL_TABLET | Freq: Three times a day (TID) | ORAL | Status: DC
  Administered 2014-07-29 – 2014-08-02 (×13): 2 via ORAL
  Filled 2014-07-29 (×16): qty 2

## 2014-07-29 NOTE — Progress Notes (Signed)
Patient had 35 beat run of Vtach. Patient asymptomatic. Patient was asleep. VSS. CBG 123. MD notified. Will continue to monitor.   Domingo Dimes RN

## 2014-07-29 NOTE — Progress Notes (Signed)
Patient ID: Marcus Johnson, male   DOB: 08-Aug-1960, 53 y.o.   MRN: [id number]   SUBJECTIVE: Remains on milrinone 0.25 mcg and IV lasix. Having brisk diuresis, weight down 16 lbs in 2 days. Feels great. Ambulating without dyspnea.   Scheduled Meds: . aspirin EC  81 mg Oral Daily  . atorvastatin  80 mg Oral Daily  . carvedilol  6.25 mg Oral BID WC  . enoxaparin (LOVENOX) injection  40 mg Subcutaneous Q24H  . fluticasone  1 spray Each Nare Daily  . furosemide  40 mg Intravenous 3 times per day  . insulin aspart  0-15 Units Subcutaneous TID WC  . insulin aspart  0-5 Units Subcutaneous QHS  . insulin glargine  60 Units Subcutaneous QHS  . isosorbide-hydrALAZINE  1 tablet Oral TID  . lisinopril  5 mg Oral BID  . potassium chloride  20 mEq Oral BID  . sodium chloride  3 mL Intravenous Q12H  . spironolactone  12.5 mg Oral Daily   Continuous Infusions: . milrinone 0.25 mcg/kg/min (07/29/14 0411)   PRN Meds:.sodium chloride, acetaminophen, ondansetron (ZOFRAN) IV, sodium chloride  Filed Vitals:   07/28/14 2008 07/28/14 2128 07/29/14 0014 07/29/14 0350  BP: 138/78 125/71 123/73 131/76  Pulse: 94 89 91 90  Temp: 98.4 F (36.9 C) 98.5 F (36.9 C) 98.1 F (36.7 C) 98.6 F (37 C)  TempSrc: Oral Oral Oral Oral  Resp: 18 16 17 16   Height:      Weight:    97.5 kg (214 lb 15.2 oz)  SpO2: 95% 97% 100% 99%    Intake/Output Summary (Last 24 hours) at 07/29/14 0838 Last data filed at 07/29/14 0742  Gross per 24 hour  Intake  547.8 ml  Output   4676 ml  Net -4128.2 ml    LABS: Basic Metabolic Panel:  Recent Labs  07/27/14 0534 07/28/14 0335 07/29/14 0451  NA 140 138 137  K 4.3 4.1 4.2  CL 106 102 103  CO2 29 28 24   GLUCOSE 125* 106* 97  BUN 47* 39* 36*  CREATININE 1.28 1.24 1.19  CALCIUM 9.1 9.2 9.4  MG 2.6*  --  2.5   RADIOLOGY: Dg Chest 2 View  07/19/2014   CLINICAL DATA:  Shortness of breath for 1 month.  EXAM: CHEST  2 VIEW  COMPARISON:  PA and lateral chest  10/21/2013 and 01/03/2012. CT chest 11/05/2008.  FINDINGS: The patient is status post CABG with an AICD in place. There is cardiomegaly but no edema. The lungs are clear. No pneumothorax or pleural effusion.  IMPRESSION: Cardiomegaly without acute disease.   Electronically Signed   By: Inge Rise M.D.   On: 07/19/2014 17:01    PHYSICAL EXAM General: NAD Neck: JVP difficult to assess d/t body habitus appears 7-8 cm, thyroid looks thick Lungs: Clear to auscultation bilaterally with normal respiratory effort. CV: Nondisplaced PMI.  Heart regular S1/S2, no S3/S4, no murmur.  1+ edema, TED hose intact.  Abdomen: Soft, nontender, no hepatosplenomegaly,+ distention.  Neurologic: Alert and oriented x 3.  Psych: Normal affect. Extremities: No clubbing or cyanosis.   TELEMETRY: Reviewed telemetry pt in NSR   ASSESSMENT AND PLAN:  1. Acute on chronic systolic CHF: Ischemic cardiomyopathy, EF 20-25%  2. CAD: s/p CABG. No ACS this admission. Last cath 2013 with patent grafts. No chest pain. Continue ASA and statin.  3. Diabetes: per primary service.   Improving with current regimen but still with some fluid on board. Weight nearing baseline of  210 or so. Will decrease milrinone to 0.125 and lasix to 40 iv bid. Increase bidil to 2 tabs tid. (will give him 30 day card).  Explained that need for milrinone signifies more advanced HF. Will need CPX after d/c.   Benay Spice 8:38 AM

## 2014-07-29 NOTE — Progress Notes (Signed)
Patient had 16 run of VTach. Asymptomatic, sitting up and talking to staff on the side of the bed. Will follow through with orders placed by MD. MD notified. Will continue to monitor.   Domingo Dimes RN

## 2014-07-29 NOTE — Progress Notes (Signed)
[facility] PROGRESS NOTE Interim History: 53 year old African-American male with past medical history of ischemic cardiomyopathy and congestive heart failure with low ejection fraction has had slowly increased lower extremity swelling in the past month with increasing orthopnea and shortness of breath on exertion who was admitted to the hospitalist service on 12/14. On admission he was found to be 30 pounds from previous weigh-in. Was started on IV Lasix, doses were adjusted giving his slight worsening of his renal function, has significant symptomatic improvement, but remains volume overloaded, patient declined [place] on 12/21. Started on milrinone to facilitate diuresis.  Filed Weights   07/27/14 0236 07/28/14 0500 07/29/14 0350  Weight: 103.874 kg (229 lb) 100.699 kg (222 lb) 97.5 kg (214 lb 15.2 oz)        Intake/Output Summary (Last 24 hours) at 07/29/14 1426 Last data filed at 07/29/14 1300  Gross per 24 hour  Intake  102.9 ml  Output   3125 ml  Net -3022.1 ml     Assessment/Plan: Acute systolic congestive heart failure - On IV lasix, with good diuresis, diuresis is managed by cardiology, milrinone and added on 12/21 to promote diuresis.. - Creatinine  stable - weight cont to improve, 110 on admission>17 kg today, [id number] since admission ,Cont strict I and os. - continue current Coreg, Bidil, spironolactone, and lisinopril.  - on 10/26/2013 EDW 89.6 kg.   Obesity (BMI 30-39.9) - counseling   DM (diabetes mellitus) type II controlled with renal manifestation:  - CBGs Well-controlled, under 200  Acute renal failure - Creatinine remains stable. -Most likely related to diuresis, will continue to monitor closely, diuresis adjusted by cardiology. -Continue to monitor closely, as on diuresis, lisinopril.  Cardiomyopathy, ischemic/CAD, status post AICD:  -On statin, aspirin, cardiology on board.      Code Status: full Family Communication: none  Disposition Plan:  Remains on telemetry   Consultants:  Cardiology  CHF team  Procedures: ECHO:   Antibiotics:  None  HPI/Subjective: Breathing is better  Objective: Filed Vitals:   07/28/14 2128 07/29/14 0014 07/29/14 0350 07/29/14 1003  BP: 125/71 123/73 131/76 116/61  Pulse: 89 91 90 87  Temp: 98.5 F (36.9 C) 98.1 F (36.7 C) 98.6 F (37 C)   TempSrc: Oral Oral Oral   Resp: 16 17 16    Height:      Weight:   97.5 kg (214 lb 15.2 oz)   SpO2: 97% 100% 99%      Exam:  General: Alert, awake, oriented x3, in no acute distress.  HEENT: No bruits, no goiter. +JVD Heart: Regular rate and rhythm. Lungs: Good air movement, bilateral air movement.  Abdomen: Soft, nontender, nondistended, positive bowel sounds.  Extremities: +2 edema bilaterally Data Reviewed: Basic Metabolic Panel:  Recent Labs Lab 07/25/14 0251 07/26/14 0532 07/27/14 0534 07/28/14 0335 07/29/14 0451  NA 141 141 140 138 137  K 4.7 4.5 4.3 4.1 4.2  CL 102 102 106 102 103  CO2 24 26 29 28 24   GLUCOSE 125* 109* 125* 106* 97  BUN 52* 53* 47* 39* 36*  CREATININE 1.32 1.26 1.28 1.24 1.19  CALCIUM 9.5 9.2 9.1 9.2 9.4  MG  --   --  2.6*  --  2.5   Liver Function Tests: No results for input(s): AST, ALT, ALKPHOS, BILITOT, PROT, ALBUMIN in the last 168 hours. No results for input(s): LIPASE, AMYLASE in the last 168 hours. No results for input(s): AMMONIA in the last 168 hours. CBC:  Recent Labs Lab  07/25/14 0251 07/26/14 0532 07/27/14 0534  WBC 5.9 6.0 6.6  HGB 11.2* 10.9* 10.8*  HCT 35.9* 34.2* 35.0*  MCV 77.9* 76.9* 75.6*  PLT 191 180 203   Cardiac Enzymes: No results for input(s): CKTOTAL, CKMB, CKMBINDEX, TROPONINI in the last 168 hours. BNP (last 3 results)  Recent Labs  10/19/13 1903 07/19/14 1512  PROBNP 1605.0* 730.5*   CBG:  Recent Labs Lab 07/28/14 1618 07/28/14 2138 07/29/14 0354 07/29/14 0641 07/29/14 1136  GLUCAP 90 155* 123* 72 115*    Recent Results (from the past 240  hour(s))  MRSA PCR Screening     Status: None   Collection Time: 07/19/14  8:07 PM  Result Value Ref Range Status   MRSA by PCR NEGATIVE NEGATIVE Final    Comment:        The GeneXpert MRSA Assay (FDA approved for NASAL specimens only), is one component of a comprehensive MRSA colonization surveillance program. It is not intended to diagnose MRSA infection nor to guide or monitor treatment for MRSA infections.      Studies: No results found.  Scheduled Meds: . aspirin EC  81 mg Oral Daily  . atorvastatin  80 mg Oral Daily  . carvedilol  6.25 mg Oral BID WC  . enoxaparin (LOVENOX) injection  40 mg Subcutaneous Q24H  . fluticasone  1 spray Each Nare Daily  . furosemide  40 mg Intravenous BID  . insulin aspart  0-15 Units Subcutaneous TID WC  . insulin aspart  0-5 Units Subcutaneous QHS  . insulin glargine  60 Units Subcutaneous QHS  . isosorbide-hydrALAZINE  2 tablet Oral TID  . lisinopril  5 mg Oral BID  . potassium chloride  20 mEq Oral BID  . sodium chloride  3 mL Intravenous Q12H  . spironolactone  12.5 mg Oral Daily   Time spent: 30 minutes  Korine Winton  [facility] Pager [phone] If 8PM-8AM, please contact night-coverage at [email or web address], password [facility] 07/29/2014, 2:26 PM  LOS: 10 days

## 2014-07-29 NOTE — Progress Notes (Signed)
Pt up ambulating in hallway at this time; no needs voiced; will cont. To monitor.

## 2014-07-29 NOTE — Progress Notes (Signed)
Pt up ambulating in hallway with family; no needs; will cont. To monitor. 

## 2014-07-30 LAB — BASIC METABOLIC PANEL
Anion gap: 11 (ref 5–15)
BUN: 39 mg/dL — ABNORMAL HIGH (ref 6–23)
CO2: 23 mmol/L (ref 19–32)
Calcium: 8.9 mg/dL (ref 8.4–10.5)
Chloride: 103 mEq/L (ref 96–112)
Creatinine, Ser: 1.25 mg/dL (ref 0.50–1.35)
GFR, EST AFRICAN AMERICAN: 74 mL/min — AB (ref >90)
GFR, EST NON AFRICAN AMERICAN: 64 mL/min — AB (ref >90)
Glucose, Bld: 145 mg/dL — ABNORMAL HIGH (ref 70–99)
POTASSIUM: 4.4 mmol/L (ref 3.5–5.1)
Sodium: 137 mmol/L (ref 135–145)

## 2014-07-30 LAB — GLUCOSE, CAPILLARY
GLUCOSE-CAPILLARY: 94 mg/dL (ref 70–99)
Glucose-Capillary: 130 mg/dL — ABNORMAL HIGH (ref 70–99)
Glucose-Capillary: 133 mg/dL — ABNORMAL HIGH (ref 70–99)
Glucose-Capillary: 155 mg/dL — ABNORMAL HIGH (ref 70–99)

## 2014-07-30 MED ORDER — POTASSIUM CHLORIDE CRYS ER 20 MEQ PO TBCR: 40.0000 meq | EXTENDED_RELEASE_TABLET | Freq: Once | ORAL | Status: DC

## 2014-07-30 MED ORDER — METOLAZONE 2.5 MG PO TABS
2.5000 mg | ORAL_TABLET | Freq: Once | ORAL | Status: AC
  Administered 2014-07-30: 2.5 mg via ORAL
  Filled 2014-07-30: qty 1

## 2014-07-30 MED ORDER — CARVEDILOL 3.125 MG PO TABS
3.1250 mg | ORAL_TABLET | Freq: Two times a day (BID) | ORAL | Status: DC
  Administered 2014-07-30 – 2014-08-02 (×6): 3.125 mg via ORAL
  Filled 2014-07-30 (×8): qty 1

## 2014-07-30 MED ORDER — POTASSIUM CHLORIDE CRYS ER 10 MEQ PO TBCR
40.0000 meq | EXTENDED_RELEASE_TABLET | Freq: Once | ORAL | Status: AC
  Administered 2014-07-30: 40 meq via ORAL
  Filled 2014-07-30: qty 4

## 2014-07-30 MED ORDER — FUROSEMIDE 10 MG/ML IJ SOLN
80.0000 mg | Freq: Two times a day (BID) | INTRAMUSCULAR | Status: DC
  Administered 2014-07-30: 80 mg via INTRAVENOUS
  Filled 2014-07-30 (×2): qty 8

## 2014-07-30 NOTE — Progress Notes (Signed)
[facility] PROGRESS NOTE Interim History: 53 year old African-American male with past medical history of ischemic cardiomyopathy and congestive heart failure with low ejection fraction has had slowly increased lower extremity swelling in the past month with increasing orthopnea and shortness of breath on exertion who was admitted to the hospitalist service on 12/14. On admission he was found to be 30 pounds from previous weigh-in. Was started on IV Lasix, doses were adjusted giving his slight worsening of his renal function, has significant symptomatic improvement, but remains volume overloaded, patient declined [place] on 12/21. Started on milrinone to facilitate diuresis.  Filed Weights   07/28/14 0500 07/29/14 0350 07/30/14 0455  Weight: 100.699 kg (222 lb) 97.5 kg (214 lb 15.2 oz) 97.3 kg (214 lb 8.1 oz)        Intake/Output Summary (Last 24 hours) at 07/30/14 1209 Last data filed at 07/30/14 1100  Gross per 24 hour  Intake  263.4 ml  Output   2700 ml  Net -2436.6 ml     Assessment/Plan: Acute systolic congestive heart failure - On IV lasix, with good diuresis, diuresis is managed by cardiology, milrinone and added on 12/21 to promote diuresis.. - Creatinine  stable - weight cont to improve, 110 on admission>97 kg today, -[id number] admission ,Cont strict I and os. - continue current Coreg, Bidil, spironolactone, and lisinopril.  - on 10/26/2013 EDW 89.6 kg. - Significant drop in diuresis after milrinone taper, management as per cardiology is to increase melatonin, and increase IV diuresis   Obesity (BMI 30-39.9) - counseling   DM (diabetes mellitus) type II controlled with renal manifestation:  - CBGs Well-controlled, under 200  Acute renal failure - Creatinine remains stable. -Most likely related to diuresis, will continue to monitor closely, diuresis adjusted by cardiology. -Continue to monitor closely, as on diuresis, lisinopril.  Cardiomyopathy, ischemic/CAD,  status post AICD:  -On statin, aspirin, cardiology on board.      Code Status: full Family Communication: none  Disposition Plan: Remains on telemetry   Consultants:  Cardiology  CHF team  Procedures: ECHO:   Antibiotics:  None  HPI/Subjective: Breathing is better  Objective: Filed Vitals:   07/29/14 1003 07/29/14 2032 07/30/14 0455 07/30/14 1021  BP: 116/61 125/68 123/70 131/82  Pulse: 87 75 92 92  Temp:  98.9 F (37.2 C) 99.2 F (37.3 C)   TempSrc:  Oral Oral   Resp:  18 24   Height:      Weight:   97.3 kg (214 lb 8.1 oz)   SpO2:  96% 100%      Exam:  General: Alert, awake, oriented x3, in no acute distress.  HEENT: No bruits, no goiter. +JVD Heart: Regular rate and rhythm. Lungs: Good air movement, bilateral air movement.  Abdomen: Soft, nontender, nondistended, positive bowel sounds.  Extremities: +2 edema bilaterally Data Reviewed: Basic Metabolic Panel:  Recent Labs Lab 07/26/14 0532 07/27/14 0534 07/28/14 0335 07/29/14 0451 07/30/14 0347  NA 141 140 138 137 137  K 4.5 4.3 4.1 4.2 4.4  CL 102 106 102 103 103  CO2 26 29 28 24 23   GLUCOSE 109* 125* 106* 97 145*  BUN 53* 47* 39* 36* 39*  CREATININE 1.26 1.28 1.24 1.19 1.25  CALCIUM 9.2 9.1 9.2 9.4 8.9  MG  --  2.6*  --  2.5  --    Liver Function Tests: No results for input(s): AST, ALT, ALKPHOS, BILITOT, PROT, ALBUMIN in the last 168 hours. No results for input(s): LIPASE, AMYLASE in the  last 168 hours. No results for input(s): AMMONIA in the last 168 hours. CBC:  Recent Labs Lab 07/25/14 0251 07/26/14 0532 07/27/14 0534  WBC 5.9 6.0 6.6  HGB 11.2* 10.9* 10.8*  HCT 35.9* 34.2* 35.0*  MCV 77.9* 76.9* 75.6*  PLT 191 180 203   Cardiac Enzymes: No results for input(s): CKTOTAL, CKMB, CKMBINDEX, TROPONINI in the last 168 hours. BNP (last 3 results)  Recent Labs  10/19/13 1903 07/19/14 1512  PROBNP 1605.0* 730.5*   CBG:  Recent Labs Lab 07/29/14 1136 07/29/14 1646  07/29/14 2108 07/30/14 0606 07/30/14 1118  GLUCAP 115* 119* 111* 94 130*    No results found for this or any previous visit (from the past 240 hour(s)).   Studies: No results found.  Scheduled Meds: . aspirin EC  81 mg Oral Daily  . atorvastatin  80 mg Oral Daily  . carvedilol  3.125 mg Oral BID WC  . enoxaparin (LOVENOX) injection  40 mg Subcutaneous Q24H  . fluticasone  1 spray Each Nare Daily  . furosemide  80 mg Intravenous BID  . insulin aspart  0-15 Units Subcutaneous TID WC  . insulin aspart  0-5 Units Subcutaneous QHS  . insulin glargine  60 Units Subcutaneous QHS  . isosorbide-hydrALAZINE  2 tablet Oral TID  . lisinopril  5 mg Oral BID  . potassium chloride  20 mEq Oral BID  . sodium chloride  3 mL Intravenous Q12H  . spironolactone  12.5 mg Oral Daily   Time spent: 30 minutes  Marcus Johnson  [facility] Pager [phone] If 8PM-8AM, please contact night-coverage at [email or web address], password [email or web address] 07/30/2014, 12:09 PM  LOS: 11 days

## 2014-07-30 NOTE — Progress Notes (Signed)
Patient ID: EBRAHEEM HEMAN, male   DOB: [birth year], 53 y.o.   MRN: [id number]   SUBJECTIVE:   Yesterday milrinone cut back to 0.125 and lasix cut back to 40 iv bid (from tid). bidil also increased to 2 tabs bid.   Urine output has slowed dramatically.Ambulating without dyspnea but feels he still has fluid on board.   Scheduled Meds: . aspirin EC  81 mg Oral Daily  . atorvastatin  80 mg Oral Daily  . carvedilol  6.25 mg Oral BID WC  . enoxaparin (LOVENOX) injection  40 mg Subcutaneous Q24H  . fluticasone  1 spray Each Nare Daily  . furosemide  80 mg Intravenous BID  . insulin aspart  0-15 Units Subcutaneous TID WC  . insulin aspart  0-5 Units Subcutaneous QHS  . insulin glargine  60 Units Subcutaneous QHS  . isosorbide-hydrALAZINE  2 tablet Oral TID  . lisinopril  5 mg Oral BID  . metolazone  2.5 mg Oral Once  . potassium chloride  20 mEq Oral BID  . sodium chloride  3 mL Intravenous Q12H  . spironolactone  12.5 mg Oral Daily   Continuous Infusions: . milrinone 0.125 mcg/kg/min (07/30/14 0020)   PRN Meds:.sodium chloride, acetaminophen, ondansetron (ZOFRAN) IV, sodium chloride  Filed Vitals:   07/29/14 0350 07/29/14 1003 07/29/14 2032 07/30/14 0455  BP: 131/76 116/61 125/68 123/70  Pulse: 90 87 75 92  Temp: 98.6 F (37 C)  98.9 F (37.2 C) 99.2 F (37.3 C)  TempSrc: Oral  Oral Oral  Resp: 16  18 24   Height:      Weight: 97.5 kg (214 lb 15.2 oz)   97.3 kg (214 lb 8.1 oz)  SpO2: 99%  96% 100%    Intake/Output Summary (Last 24 hours) at 07/30/14 0935 Last data filed at 07/30/14 0509  Gross per 24 hour  Intake  275.1 ml  Output   2100 ml  Net -1824.9 ml    LABS: Basic Metabolic Panel:  Recent Labs  07/29/14 0451 07/30/14 0347  NA 137 137  K 4.2 4.4  CL 103 103  CO2 24 23  GLUCOSE 97 145*  BUN 36* 39*  CREATININE 1.19 1.25  CALCIUM 9.4 8.9  MG 2.5  --    RADIOLOGY: Dg Chest 2 View  07/19/2014   CLINICAL DATA:  Shortness of breath for 1 month.  EXAM:  CHEST  2 VIEW  COMPARISON:  PA and lateral chest 10/21/2013 and 01/03/2012. CT chest 11/05/2008.  FINDINGS: The patient is status post CABG with an AICD in place. There is cardiomegaly but no edema. The lungs are clear. No pneumothorax or pleural effusion.  IMPRESSION: Cardiomegaly without acute disease.   Electronically Signed   By: Inge Rise M.D.   On: 07/19/2014 17:01    PHYSICAL EXAM General: NAD Neck: JVP difficult to assess d/t body habitus appears 8-9, thyroid looks thick Lungs: Clear to auscultation bilaterally with normal respiratory effort. CV: Nondisplaced PMI.  Heart regular S1/S2, no S3/S4, no murmur.  1-2+ edema, TED hose intact.  Abdomen: Soft, nontender, no hepatosplenomegaly,minimal distention.  Neurologic: Alert and oriented x 3.  Psych: Normal affect. Extremities: No clubbing or cyanosis.   TELEMETRY: Reviewed telemetry pt in NSR   ASSESSMENT AND PLAN:  1. Acute on chronic systolic CHF: Ischemic cardiomyopathy, EF 20-25%  2. CAD: s/p CABG. No ACS this admission. Last cath 2013 with patent grafts. No chest pain. Continue ASA and statin.  3. Diabetes: per primary service.   Urine  output has fallen off dramatically with reduction in milrinone. This concerns me for inotrope dependence/low output. Will increase milrinone back to 0.25 mcg/kg/min and increase lasix to 80 iv bid with one dose metolazone. Will cut carvedilol back again to 3.125. Continue low-dose lisinopril. Continue Bidil 2 tabs tid. (will give him 30 day card).    Once fully diuresed will wean milrinone and place on torsemide 40 bid. If unable to maintain euvolemia or renal function worsens will need RHC. He has been hesitant about this but now agrees to proceed, if needed.   Daniel Bensimhon,MD 9:35 AM

## 2014-07-31 LAB — BASIC METABOLIC PANEL
ANION GAP: 7 (ref 5–15)
BUN: 40 mg/dL — ABNORMAL HIGH (ref 6–23)
CALCIUM: 8.8 mg/dL (ref 8.4–10.5)
CO2: 26 mmol/L (ref 19–32)
Chloride: 104 mEq/L (ref 96–112)
Creatinine, Ser: 1.27 mg/dL (ref 0.50–1.35)
GFR calc Af Amer: 73 mL/min — ABNORMAL LOW (ref >90)
GFR calc non Af Amer: 63 mL/min — ABNORMAL LOW (ref >90)
Glucose, Bld: 159 mg/dL — ABNORMAL HIGH (ref 70–99)
Potassium: 4.6 mmol/L (ref 3.5–5.1)
Sodium: 137 mmol/L (ref 135–145)

## 2014-07-31 LAB — GLUCOSE, CAPILLARY
GLUCOSE-CAPILLARY: 115 mg/dL — AB (ref 70–99)
GLUCOSE-CAPILLARY: 124 mg/dL — AB (ref 70–99)
Glucose-Capillary: 100 mg/dL — ABNORMAL HIGH (ref 70–99)
Glucose-Capillary: 111 mg/dL — ABNORMAL HIGH (ref 70–99)

## 2014-07-31 MED ORDER — FUROSEMIDE 40 MG PO TABS
60.0000 mg | ORAL_TABLET | Freq: Two times a day (BID) | ORAL | Status: DC
  Filled 2014-07-31 (×2): qty 1

## 2014-07-31 MED ORDER — MILRINONE IN DEXTROSE 20 MG/100ML IV SOLN
0.1250 ug/kg/min | INTRAVENOUS | Status: DC
  Administered 2014-08-01: 0.125 ug/kg/min via INTRAVENOUS
  Filled 2014-07-31: qty 100

## 2014-07-31 MED ORDER — FUROSEMIDE 40 MG PO TABS
60.0000 mg | ORAL_TABLET | Freq: Two times a day (BID) | ORAL | Status: DC
  Administered 2014-07-31 – 2014-08-02 (×5): 60 mg via ORAL
  Filled 2014-07-31 (×7): qty 1

## 2014-07-31 NOTE — Progress Notes (Signed)
Patient ID: Marcus Johnson, male   DOB: 12/17/1960, 53 y.o.   MRN: [id number]   SUBJECTIVE:   Yesterday milrinone increased back to 0.25 and lasix also increased. Weight down another 2 pounds. Renal function stable. BUN climbing slowly.   Feels good. Ambulating. Denies dyspnea.   Scheduled Meds: . aspirin EC  81 mg Oral Daily  . atorvastatin  80 mg Oral Daily  . carvedilol  3.125 mg Oral BID WC  . enoxaparin (LOVENOX) injection  40 mg Subcutaneous Q24H  . fluticasone  1 spray Each Nare Daily  . furosemide  80 mg Intravenous BID  . insulin aspart  0-15 Units Subcutaneous TID WC  . insulin aspart  0-5 Units Subcutaneous QHS  . insulin glargine  60 Units Subcutaneous QHS  . isosorbide-hydrALAZINE  2 tablet Oral TID  . lisinopril  5 mg Oral BID  . potassium chloride  20 mEq Oral BID  . sodium chloride  3 mL Intravenous Q12H  . spironolactone  12.5 mg Oral Daily   Continuous Infusions: . milrinone 0.25 mcg/kg/min (07/31/14 0331)   PRN Meds:.sodium chloride, acetaminophen, ondansetron (ZOFRAN) IV, sodium chloride  Filed Vitals:   07/30/14 1430 07/30/14 1613 07/30/14 2006 07/31/14 0337  BP: 113/72 133/69 128/65 121/64  Pulse: 94 93 94 91  Temp: 98 F (36.7 C)  98.2 F (36.8 C) 99.7 F (37.6 C)  TempSrc: Oral  Oral Oral  Resp: 18  18 18   Height:      Weight:    96.389 kg (212 lb 8 oz)  SpO2: 100%  99% 95%    Intake/Output Summary (Last 24 hours) at 07/31/14 0750 Last data filed at 07/31/14 0500  Gross per 24 hour  Intake 1521.9 ml  Output   3050 ml  Net -1528.1 ml    LABS: Basic Metabolic Panel:  Recent Labs  07/29/14 0451 07/30/14 0347 07/31/14 0345  NA 137 137 137  K 4.2 4.4 4.6  CL 103 103 104  CO2 24 23 26   GLUCOSE 97 145* 159*  BUN 36* 39* 40*  CREATININE 1.19 1.25 1.27  CALCIUM 9.4 8.9 8.8  MG 2.5  --   --    RADIOLOGY: Dg Chest 2 View  07/19/2014   CLINICAL DATA:  Shortness of breath for 1 month.  EXAM: CHEST  2 VIEW  COMPARISON:  PA and lateral  chest 10/21/2013 and 01/03/2012. CT chest 11/05/2008.  FINDINGS: The patient is status post CABG with an AICD in place. There is cardiomegaly but no edema. The lungs are clear. No pneumothorax or pleural effusion.  IMPRESSION: Cardiomegaly without acute disease.   Electronically Signed   By: Inge Rise M.D.   On: 07/19/2014 17:01    PHYSICAL EXAM General: NAD Neck: JVP appears flat, thyroid looks thick Lungs: Clear to auscultation bilaterally with normal respiratory effort. CV: Nondisplaced PMI.  Heart regular S1/S2, no S3/S4, no murmur.  tr edema, TED hose intact.  Abdomen: Soft, nontender, no hepatosplenomegaly, no distention.  Neurologic: Alert and oriented x 3.  Psych: Normal affect. Extremities: No clubbing or cyanosis.   TELEMETRY: Reviewed telemetry pt in NSR   ASSESSMENT AND PLAN:  1. Acute on chronic systolic CHF: Ischemic cardiomyopathy, EF 20-25%  2. CAD: s/p CABG. No ACS this admission. Last cath 2013 with patent grafts. No chest pain. Continue ASA and statin.  3. Diabetes: per primary service.   He is better. This may be as good as we get his volume status. Yesterday I was concerned for  possible inotrope dependence to maintain euvolemia but we will try again to wean him off inotropes again. Decrease milrinone to 0.125 and stop iv lasix. Will start lasix 60 po bid tonight (was on 40 bid) and watch volume status and renal function. (He has been hesitant about RHC or central access but will agree to it if he needs).   We cut carvedilol back to 3.125. Continue low-dose lisinopril. Continue Bidil 2 tabs tid. (will give him 30 day card).     If unable to maintain euvolemia or renal function worsens will need RHC otherwise outpatient CPX.    Daniel Bensimhon,MD 7:50 AM

## 2014-07-31 NOTE — Progress Notes (Signed)
[facility] PROGRESS NOTE Interim History: 52 year old African-American male with past medical history of ischemic cardiomyopathy and congestive heart failure with low ejection fraction has had slowly increased lower extremity swelling in the past month with increasing orthopnea and shortness of breath on exertion who was admitted to the hospitalist service on 12/14. On admission he was found to be 30 pounds from previous weigh-in. Was started on IV Lasix, doses were adjusted giving his slight worsening of his renal function, has significant symptomatic improvement, but remains volume overloaded, patient declined [place] on 12/21. Started on milrinone to facilitate diuresis.  Filed Weights   07/29/14 0350 07/30/14 0455 07/31/14 0337  Weight: 97.5 kg (214 lb 15.2 oz) 97.3 kg (214 lb 8.1 oz) 96.389 kg (212 lb 8 oz)        Intake/Output Summary (Last 24 hours) at 07/31/14 1319 Last data filed at 07/31/14 0900  Gross per 24 hour  Intake 1401.9 ml  Output   2650 ml  Net -1248.1 ml     Assessment/Plan: Acute systolic congestive heart failure - Initially on On IV lasix, transitioned to oral Lasix, with good diuresis, diuresis is managed by cardiology, milrinone and added on 12/21 to promote diuresis.. - Creatinine  stable - weight cont to improve, 110 on admission>96 kg today, -26000 since admission ,Cont strict I and os. - continue current Coreg, Bidil, spironolactone, and lisinopril.  - on 10/26/2013 EDW 89.6 kg. - Significant drop in diuresis after milrinone taper, management as per cardiology .   Obesity (BMI 30-39.9) - counseling   DM (diabetes mellitus) type II controlled with renal manifestation:  - CBGs Well-controlled, under 200  Acute renal failure - Creatinine remains stable. -Most likely related to diuresis, will continue to monitor closely, diuresis adjusted by cardiology. -Continue to monitor closely, as on diuresis, lisinopril.  Cardiomyopathy, ischemic/CAD,  status post AICD:  -On statin, aspirin, BiDil , lisinopril , Coreg cardiology on board.      Code Status: full Family Communication: none  Disposition Plan: Remains on telemetry   Consultants:  Cardiology  CHF team  Procedures: ECHO:   Antibiotics:  None  HPI/Subjective: Breathing is better  Objective: Filed Vitals:   07/30/14 1613 07/30/14 2006 07/31/14 0337 07/31/14 1020  BP: 133/69 128/65 121/64 128/78  Pulse: 93 94 91   Temp:  98.2 F (36.8 C) 99.7 F (37.6 C)   TempSrc:  Oral Oral   Resp:  18 18   Height:      Weight:   96.389 kg (212 lb 8 oz)   SpO2:  99% 95%      Exam:  General: Alert, awake, oriented x3, in no acute distress.  HEENT: No bruits, no goiter. +JVD Heart: Regular rate and rhythm. Lungs: Good air movement, bilateral air movement.  Abdomen: Soft, nontender, nondistended, positive bowel sounds.  Extremities: +2 edema bilaterally Data Reviewed: Basic Metabolic Panel:  Recent Labs Lab 07/27/14 0534 07/28/14 0335 07/29/14 0451 07/30/14 0347 07/31/14 0345  NA 140 138 137 137 137  K 4.3 4.1 4.2 4.4 4.6  CL 106 102 103 103 104  CO2 29 28 24 23 26   GLUCOSE 125* 106* 97 145* 159*  BUN 47* 39* 36* 39* 40*  CREATININE 1.28 1.24 1.19 1.25 1.27  CALCIUM 9.1 9.2 9.4 8.9 8.8  MG 2.6*  --  2.5  --   --    Liver Function Tests: No results for input(s): AST, ALT, ALKPHOS, BILITOT, PROT, ALBUMIN in the last 168 hours. No results  for input(s): LIPASE, AMYLASE in the last 168 hours. No results for input(s): AMMONIA in the last 168 hours. CBC:  Recent Labs Lab 07/25/14 0251 07/26/14 0532 07/27/14 0534  WBC 5.9 6.0 6.6  HGB 11.2* 10.9* 10.8*  HCT 35.9* 34.2* 35.0*  MCV 77.9* 76.9* 75.6*  PLT 191 180 203   Cardiac Enzymes: No results for input(s): CKTOTAL, CKMB, CKMBINDEX, TROPONINI in the last 168 hours. BNP (last 3 results)  Recent Labs  10/19/13 1903 07/19/14 1512  PROBNP 1605.0* 730.5*   CBG:  Recent Labs Lab  07/30/14 1118 07/30/14 1642 07/30/14 2119 07/31/14 0538 07/31/14 1123  GLUCAP 130* 133* 155* 100* 111*    No results found for this or any previous visit (from the past 240 hour(s)).   Studies: No results found.  Scheduled Meds: . aspirin EC  81 mg Oral Daily  . atorvastatin  80 mg Oral Daily  . carvedilol  3.125 mg Oral BID WC  . enoxaparin (LOVENOX) injection  40 mg Subcutaneous Q24H  . fluticasone  1 spray Each Nare Daily  . furosemide  60 mg Oral BID  . insulin aspart  0-15 Units Subcutaneous TID WC  . insulin aspart  0-5 Units Subcutaneous QHS  . insulin glargine  60 Units Subcutaneous QHS  . isosorbide-hydrALAZINE  2 tablet Oral TID  . lisinopril  5 mg Oral BID  . potassium chloride  20 mEq Oral BID  . sodium chloride  3 mL Intravenous Q12H  . spironolactone  12.5 mg Oral Daily   Time spent: 30 minutes  ELGERGAWY, [name]  [facility] Pager [phone] If 8PM-8AM, please contact night-coverage at [email or web address], password [facility] 07/31/2014, 1:19 PM  LOS: 12 days

## 2014-08-01 LAB — BASIC METABOLIC PANEL
Anion gap: 7 (ref 5–15)
BUN: 37 mg/dL — ABNORMAL HIGH (ref 6–23)
CO2: 25 mmol/L (ref 19–32)
CREATININE: 1.13 mg/dL (ref 0.50–1.35)
Calcium: 9.1 mg/dL (ref 8.4–10.5)
Chloride: 106 mEq/L (ref 96–112)
GFR calc Af Amer: 84 mL/min — ABNORMAL LOW (ref >90)
GFR calc non Af Amer: 73 mL/min — ABNORMAL LOW (ref >90)
Glucose, Bld: 79 mg/dL (ref 70–99)
Potassium: 4.5 mmol/L (ref 3.5–5.1)
Sodium: 138 mmol/L (ref 135–145)

## 2014-08-01 LAB — GLUCOSE, CAPILLARY
GLUCOSE-CAPILLARY: 99 mg/dL (ref 70–99)
GLUCOSE-CAPILLARY: 138 mg/dL — AB (ref 70–99)
Glucose-Capillary: 86 mg/dL (ref 70–99)

## 2014-08-01 MED ORDER — POTASSIUM CHLORIDE ER 10 MEQ PO TBCR
10.0000 meq | EXTENDED_RELEASE_TABLET | Freq: Two times a day (BID) | ORAL | Status: DC
  Administered 2014-08-01 – 2014-08-02 (×3): 10 meq via ORAL
  Filled 2014-08-01 (×4): qty 1

## 2014-08-01 MED ORDER — SPIRONOLACTONE 25 MG PO TABS
25.0000 mg | ORAL_TABLET | Freq: Every day | ORAL | Status: DC
  Administered 2014-08-01 – 2014-08-02 (×2): 25 mg via ORAL
  Filled 2014-08-01 (×2): qty 1

## 2014-08-01 MED ORDER — POTASSIUM CHLORIDE ER 10 MEQ PO TBCR
10.0000 meq | EXTENDED_RELEASE_TABLET | Freq: Two times a day (BID) | ORAL | Status: DC
  Filled 2014-08-01: qty 1

## 2014-08-01 NOTE — Progress Notes (Signed)
Patient ID: Marcus Johnson, male   DOB: Dec 06, 1960, 53 y.o.   MRN: [id number]   SUBJECTIVE:   Milrinone decreased to 0.125 yesterday and converted to po Lasix.  He did well, weight down 1 lb.  BUN/creatinine lower.  Walking without dyspnea.   Scheduled Meds: . aspirin EC  81 mg Oral Daily  . atorvastatin  80 mg Oral Daily  . carvedilol  3.125 mg Oral BID WC  . enoxaparin (LOVENOX) injection  40 mg Subcutaneous Q24H  . fluticasone  1 spray Each Nare Daily  . furosemide  60 mg Oral BID  . insulin aspart  0-15 Units Subcutaneous TID WC  . insulin aspart  0-5 Units Subcutaneous QHS  . insulin glargine  60 Units Subcutaneous QHS  . isosorbide-hydrALAZINE  2 tablet Oral TID  . lisinopril  5 mg Oral BID  . potassium chloride  20 mEq Oral BID  . sodium chloride  3 mL Intravenous Q12H  . spironolactone  25 mg Oral Daily   Continuous Infusions:   PRN Meds:.sodium chloride, acetaminophen, ondansetron (ZOFRAN) IV, sodium chloride  Filed Vitals:   07/31/14 1020 07/31/14 1300 07/31/14 1942 08/01/14 0526  BP: 128/78 119/62 130/75 118/64  Pulse:  95 96 90  Temp:  97.9 F (36.6 C) 98.3 F (36.8 C) 98.4 F (36.9 C)  TempSrc:  Oral Oral Oral  Resp:  18 16 16   Height:      Weight:    211 lb 13.8 oz (96.1 kg)  SpO2:  100% 98% 99%    Intake/Output Summary (Last 24 hours) at 08/01/14 0937 Last data filed at 08/01/14 0815  Gross per 24 hour  Intake    840 ml  Output   2700 ml  Net  -1860 ml    LABS: Basic Metabolic Panel:  Recent Labs  07/31/14 0345 08/01/14 0525  NA 137 138  K 4.6 4.5  CL 104 106  CO2 26 25  GLUCOSE 159* 79  BUN 40* 37*  CREATININE 1.27 1.13  CALCIUM 8.8 9.1   RADIOLOGY: Dg Chest 2 View  07/19/2014   CLINICAL DATA:  Shortness of breath for 1 month.  EXAM: CHEST  2 VIEW  COMPARISON:  PA and lateral chest 10/21/2013 and 01/03/2012. CT chest 11/05/2008.  FINDINGS: The patient is status post CABG with an AICD in place. There is cardiomegaly but no edema. The  lungs are clear. No pneumothorax or pleural effusion.  IMPRESSION: Cardiomegaly without acute disease.   Electronically Signed   By: Inge Rise M.D.   On: 07/19/2014 17:01    PHYSICAL EXAM General: NAD Neck: JVP 8 cm, thyroid looks thick Lungs: Clear to auscultation bilaterally with normal respiratory effort. CV: Nondisplaced PMI.  Heart regular S1/S2, no S3/S4, no murmur. 1+ ankle edema, TED hose in place.  Abdomen: Soft, nontender, no hepatosplenomegaly, no distention.  Neurologic: Alert and oriented x 3.  Psych: Normal affect. Extremities: No clubbing or cyanosis.   TELEMETRY: Reviewed telemetry pt in NSR   ASSESSMENT AND PLAN:  1. Acute on chronic systolic CHF: Ischemic cardiomyopathy, EF 20-25%.   2. CAD: s/p CABG. No ACS this admission. Last cath 2013 with patent grafts. No chest pain. Continue ASA and statin.  3. Diabetes: per primary service.   He is better. This may be as good as we get his volume status. BUN/creatinine down, he is on po Lasix now.  I will stop milrinone today and observe.  If creatinine and volume status remain stable, possible home tomorrow.  We cut carvedilol back to 3.125. Continue low-dose lisinopril. Continue Bidil 2 tabs tid (will give him 30 day card).  I will increase spironolactone to 25 mg daily.    If unable to maintain euvolemia or renal function worsens will need RHC (has been reticent this admission for cath or PICC but will agree if needed) otherwise outpatient CPX.    Theador Hawthorne 9:37 AM 08/01/2014

## 2014-08-01 NOTE — Progress Notes (Signed)
[facility] PROGRESS NOTE Interim History: 53 year old African-American male with past medical history of ischemic cardiomyopathy and congestive heart failure with low ejection fraction has had slowly increased lower extremity swelling in the past month with increasing orthopnea and shortness of breath on exertion who was admitted to the hospitalist service on 12/14. On admission he was found to be 30 pounds from previous weigh-in. Was started on IV Lasix, doses were adjusted giving his slight worsening of his renal function, has significant symptomatic improvement, but remains volume overloaded, patient declined Nord on 12/21. Started on milrinone to facilitate diuresis.  Filed Weights   07/30/14 0455 07/31/14 0337 08/01/14 0526  Weight: 97.3 kg (214 lb 8.1 oz) 96.389 kg (212 lb 8 oz) 96.1 kg (211 lb 13.8 oz)        Intake/Output Summary (Last 24 hours) at 08/01/14 1000 Last data filed at 08/01/14 0815  Gross per 24 hour  Intake    840 ml  Output   2700 ml  Net  -1860 ml     Assessment/Plan: Acute systolic congestive heart failure - Initially on On IV lasix, transitioned to oral Lasix, with good diuresis, diuresis is managed by cardiology, milrinone and added on 12/21 to promote diuresis.. - Creatinine  stable - weight cont to improve, 110 on admission>96 kg today, -27700 since admission ,Cont strict I and os. - continue current Coreg, Bidil, spironolactone, lisinopril.  - on 10/26/2013 EDW 89.6 kg. - Off milrinone drip today, D/C in am if remains stable.   Obesity (BMI 30-39.9) - counseling   DM (diabetes mellitus) type II controlled with renal manifestation:  - CBGs Well-controlled, under 200  Acute renal failure - Creatinine remains stable. -Most likely related to diuresis, will continue to monitor closely, diuresis adjusted by cardiology. -Continue to monitor closely, as on diuresis, lisinopril.  Cardiomyopathy, ischemic/CAD, status post AICD:  -On statin,  aspirin, BiDil , lisinopril , Coreg cardiology on board.      Code Status: full Family Communication: none  Disposition Plan: Remains on telemetry   Consultants:  Cardiology  CHF team  Procedures: ECHO:   Antibiotics:  None  HPI/Subjective: Breathing is better  Objective: Filed Vitals:   07/31/14 1020 07/31/14 1300 07/31/14 1942 08/01/14 0526  BP: 128/78 119/62 130/75 118/64  Pulse:  95 96 90  Temp:  97.9 F (36.6 C) 98.3 F (36.8 C) 98.4 F (36.9 C)  TempSrc:  Oral Oral Oral  Resp:  18 16 16   Height:      Weight:    96.1 kg (211 lb 13.8 oz)  SpO2:  100% 98% 99%     Exam:  General: Alert, awake, oriented x3, in no acute distress.  HEENT: No bruits, no goiter. +JVD Heart: Regular rate and rhythm. Lungs: Good air movement, bilateral air movement.  Abdomen: Soft, nontender, nondistended, positive bowel sounds.  Extremities: +1 edema bilaterally Data Reviewed: Basic Metabolic Panel:  Recent Labs Lab 07/27/14 0534 07/28/14 0335 07/29/14 0451 07/30/14 0347 07/31/14 0345 08/01/14 0525  NA 140 138 137 137 137 138  K 4.3 4.1 4.2 4.4 4.6 4.5  CL 106 102 103 103 104 106  CO2 29 28 24 23 26 25   GLUCOSE 125* 106* 97 145* 159* 79  BUN 47* 39* 36* 39* 40* 37*  CREATININE 1.28 1.24 1.19 1.25 1.27 1.13  CALCIUM 9.1 9.2 9.4 8.9 8.8 9.1  MG 2.6*  --  2.5  --   --   --    Liver Function Tests:  No results for input(s): AST, ALT, ALKPHOS, BILITOT, PROT, ALBUMIN in the last 168 hours. No results for input(s): LIPASE, AMYLASE in the last 168 hours. No results for input(s): AMMONIA in the last 168 hours. CBC:  Recent Labs Lab 07/26/14 0532 07/27/14 0534  WBC 6.0 6.6  HGB 10.9* 10.8*  HCT 34.2* 35.0*  MCV 76.9* 75.6*  PLT 180 203   Cardiac Enzymes: No results for input(s): CKTOTAL, CKMB, CKMBINDEX, TROPONINI in the last 168 hours. BNP (last 3 results)  Recent Labs  10/19/13 1903 07/19/14 1512  PROBNP 1605.0* 730.5*   CBG:  Recent Labs Lab  07/31/14 0538 07/31/14 1123 07/31/14 1631 07/31/14 2116 08/01/14 0615  GLUCAP 100* 111* 115* 124* 86    No results found for this or any previous visit (from the past 240 hour(s)).   Studies: No results found.  Scheduled Meds: . aspirin EC  81 mg Oral Daily  . atorvastatin  80 mg Oral Daily  . carvedilol  3.125 mg Oral BID WC  . enoxaparin (LOVENOX) injection  40 mg Subcutaneous Q24H  . fluticasone  1 spray Each Nare Daily  . furosemide  60 mg Oral BID  . insulin aspart  0-15 Units Subcutaneous TID WC  . insulin aspart  0-5 Units Subcutaneous QHS  . insulin glargine  60 Units Subcutaneous QHS  . isosorbide-hydrALAZINE  2 tablet Oral TID  . lisinopril  5 mg Oral BID  . potassium chloride  20 mEq Oral BID  . sodium chloride  3 mL Intravenous Q12H  . spironolactone  25 mg Oral Daily   Time spent: 30 minutes  Ambry Dix  [facility] Pager [phone] If 8PM-8AM, please contact night-coverage at [email or web address], password [email or web address] 08/01/2014, 10:00 AM  LOS: 13 days

## 2014-08-02 LAB — BASIC METABOLIC PANEL
ANION GAP: 9 (ref 5–15)
BUN: 39 mg/dL — ABNORMAL HIGH (ref 6–23)
CALCIUM: 9 mg/dL (ref 8.4–10.5)
CHLORIDE: 104 meq/L (ref 96–112)
CO2: 22 mmol/L (ref 19–32)
CREATININE: 1.06 mg/dL (ref 0.50–1.35)
GFR calc non Af Amer: 78 mL/min — ABNORMAL LOW (ref >90)
Glucose, Bld: 76 mg/dL (ref 70–99)
Potassium: 4.2 mmol/L (ref 3.5–5.1)
Sodium: 135 mmol/L (ref 135–145)

## 2014-08-02 LAB — CBC
HCT: 38.9 % — ABNORMAL LOW (ref 39.0–52.0)
Hemoglobin: 12.2 g/dL — ABNORMAL LOW (ref 13.0–17.0)
MCH: 23.6 pg — ABNORMAL LOW (ref 26.0–34.0)
MCHC: 31.4 g/dL (ref 30.0–36.0)
MCV: 75.4 fL — ABNORMAL LOW (ref 78.0–100.0)
Platelets: 234 10*3/uL (ref 150–400)
RBC: 5.16 MIL/uL (ref 4.22–5.81)
RDW: 16.3 % — AB (ref 11.5–15.5)
WBC: 6.6 10*3/uL (ref 4.0–10.5)

## 2014-08-02 LAB — GLUCOSE, CAPILLARY
Glucose-Capillary: 83 mg/dL (ref 70–99)
Glucose-Capillary: 60 mg/dL — ABNORMAL LOW (ref 70–99)
Glucose-Capillary: 71 mg/dL (ref 70–99)

## 2014-08-02 LAB — BRAIN NATRIURETIC PEPTIDE: B NATRIURETIC PEPTIDE 5: 290.5 pg/mL — AB (ref 0.0–100.0)

## 2014-08-02 MED ORDER — ISOSORB DINITRATE-HYDRALAZINE 20-37.5 MG PO TABS: 2.0000 | ORAL_TABLET | Freq: Three times a day (TID) | ORAL | Status: DC

## 2014-08-02 MED ORDER — POTASSIUM CHLORIDE ER 10 MEQ PO TBCR: 10.0000 meq | EXTENDED_RELEASE_TABLET | Freq: Two times a day (BID) | ORAL | Status: DC

## 2014-08-02 MED ORDER — SPIRONOLACTONE 25 MG PO TABS: 25.0000 mg | ORAL_TABLET | Freq: Every day | ORAL | Status: DC

## 2014-08-02 MED ORDER — INSULIN GLARGINE 100 UNIT/ML SOLOSTAR PEN: 52.0000 [IU] | PEN_INJECTOR | Freq: Every day | SUBCUTANEOUS | Status: DC

## 2014-08-02 MED ORDER — LISINOPRIL 5 MG PO TABS: 5.0000 mg | ORAL_TABLET | Freq: Two times a day (BID) | ORAL | Status: DC

## 2014-08-02 MED ORDER — CARVEDILOL 3.125 MG PO TABS: 3.1250 mg | ORAL_TABLET | Freq: Two times a day (BID) | ORAL | Status: DC

## 2014-08-02 MED ORDER — FUROSEMIDE 80 MG PO TABS
80.0000 mg | ORAL_TABLET | Freq: Two times a day (BID) | ORAL | Status: DC
  Filled 2014-08-02 (×2): qty 1

## 2014-08-02 MED ORDER — FUROSEMIDE 40 MG PO TABS: 80.0000 mg | ORAL_TABLET | Freq: Two times a day (BID) | ORAL | Status: DC

## 2014-08-02 NOTE — Progress Notes (Signed)
Hypoglycemic Event  CBG: 60  Treatment: 15 GM carbohydrate snack  Symptoms: None  Follow-up CBG: Time:0700 CBG Result 71  Possible Reasons for Event: Medication regimen: dose of lantus insulin too high last night per patient   Comments/MD notified:    Freddy Jaksch  Remember to initiate Hypoglycemia Order Set & complete

## 2014-08-02 NOTE — Discharge Instructions (Signed)
Heart Failure °Heart failure means your heart has trouble pumping blood. This makes it hard for your body to work well. Heart failure is usually a long-term (chronic) condition. You must take good care of yourself and follow your doctor's treatment plan. °HOME CARE °· Take your heart medicine as told by your doctor. °¨ Do not stop taking medicine unless your doctor tells you to. °¨ Do not skip any dose of medicine. °¨ Refill your medicines before they run out. °¨ Take other medicines only as told by your doctor or pharmacist. °· Stay active if told by your doctor. The elderly and people with severe heart failure should talk with a doctor about physical activity. °· Eat heart-healthy foods. Choose foods that are without trans fat and are low in saturated fat, cholesterol, and salt (sodium). This includes fresh or frozen fruits and vegetables, fish, lean meats, fat-free or low-fat dairy foods, whole grains, and high-fiber foods. Lentils and dried peas and beans (legumes) are also good choices. °· Limit salt if told by your doctor. °· Cook in a healthy way. Roast, grill, broil, bake, poach, steam, or stir-fry foods. °· Limit fluids as told by your doctor. °· Weigh yourself every morning. Do this after you pee (urinate) and before you eat breakfast. Write down your weight to give to your doctor. °· Take your blood pressure and write it down if your doctor tells you to. °· Ask your doctor how to check your pulse. Check your pulse as told. °· Lose weight if told by your doctor. °· Stop smoking or chewing tobacco. Do not use gum or patches that help you quit without your doctor's approval. °· Schedule and go to doctor visits as told. °· Nonpregnant women should have no more than 1 drink a day. Men should have no more than 2 drinks a day. Talk to your doctor about drinking alcohol. °· Stop illegal drug use. °· Stay current with shots (immunizations). °· Manage your health conditions as told by your doctor. °· Learn to  manage your stress. °· Rest when you are tired. °· If it is really hot outside: °¨ Avoid intense activities. °¨ Use air conditioning or fans, or get in a cooler place. °¨ Avoid caffeine and alcohol. °¨ Wear loose-fitting, lightweight, and light-colored clothing. °· If it is really cold outside: °¨ Avoid intense activities. °¨ Layer your clothing. °¨ Wear mittens or gloves, a hat, and a scarf when going outside. °¨ Avoid alcohol. °· Learn about heart failure and get support as needed. °· Get help to maintain or improve your quality of life and your ability to care for yourself as needed. °GET HELP IF:  °· You gain 03 lb/1.4 kg or more in 1 day or 05 lb/2.3 kg in a week. °· You are more short of breath than usual. °· You cannot do your normal activities. °· You tire easily. °· You cough more than normal, especially with activity. °· You have any or more puffiness (swelling) in areas such as your hands, feet, ankles, or belly (abdomen). °· You cannot sleep because it is hard to breathe. °· You feel like your heart is beating fast (palpitations). °· You get dizzy or light-headed when you stand up. °GET HELP RIGHT AWAY IF:  °· You have trouble breathing. °· There is a change in mental status, such as becoming less alert or not being able to focus. °· You have chest pain or discomfort. °· You faint. °MAKE SURE YOU:  °· Understand these instructions. °·   Will watch your condition.  Will get help right away if you are not doing well or get worse. Document Released: 05/01/2008 Document Revised: 12/07/2013 Document Reviewed: 09/08/2012 [facility] Patient Information 2015 [place], [place]. This information is not intended to replace advice given to you by your health care provider. Make sure you discuss any questions you have with your health care provider.  Follow with Primary MD [name], MD in 7 days   Get CBC, CMP, 2 view Chest X ray checked  by Primary MD next visit.    Activity: As tolerated with Full fall  precautions use walker/cane & assistance as needed   Disposition Home    Diet: Heart Healthy  , with feeding assistance and aspiration precautions as needed.  For Heart failure patients - Check your Weight same time everyday, if you gain over 2 pounds, or you develop in leg swelling, experience more shortness of breath or chest pain, call your Primary MD immediately. Follow Cardiac Low Salt Diet and 1.5 lit/day fluid restriction.   On your next visit with your primary care physician please Get Medicines reviewed and adjusted.   Please request your Prim.MD to go over all Hospital Tests and Procedure/Radiological results at the follow up, please get all Hospital records sent to your Prim MD by signing hospital release before you go home.   If you experience worsening of your admission symptoms, develop shortness of breath, life threatening emergency, suicidal or homicidal thoughts you must seek medical attention immediately by calling 911 or calling your MD immediately  if symptoms less severe.  You Must read complete instructions/literature along with all the possible adverse reactions/side effects for all the Medicines you take and that have been prescribed to you. Take any new Medicines after you have completely understood and accpet all the possible adverse reactions/side effects.   Do not drive, operating heavy machinery, perform activities at heights, swimming or participation in water activities or provide baby sitting services if your were admitted for syncope or siezures until you have seen by Primary MD or a Neurologist and advised to do so again.  Do not drive when taking Pain medications.    Do not take more than prescribed Pain, Sleep and Anxiety Medications  Special Instructions: If you have smoked or chewed Tobacco  in the last 2 yrs please stop smoking, stop any regular Alcohol  and or any Recreational drug use.  Wear Seat belts while driving.   Please note  You were  cared for by a hospitalist during your hospital stay. If you have any questions about your discharge medications or the care you received while you were in the hospital after you are discharged, you can call the unit and asked to speak with the hospitalist on call if the hospitalist that took care of you is not available. Once you are discharged, your primary care physician will handle any further medical issues. Please note that NO REFILLS for any discharge medications will be authorized once you are discharged, as it is imperative that you return to your primary care physician (or establish a relationship with a primary care physician if you do not have one) for your aftercare needs so that they can reassess your need for medications and monitor your lab values.

## 2014-08-02 NOTE — Progress Notes (Addendum)
Nursing note Pt given discharge instructions has medication list and paper prescriptions, AVS. Pt has heart failure booklet and weight chart. All questions were answered. Will discharge home as ordered. Edward Trevino, Bettina Gavia RN
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
## 2014-08-02 NOTE — Progress Notes (Signed)
Patient ID: Marcus Johnson, male   DOB: [birth year], 53 y.o.   MRN: [id number]   SUBJECTIVE:   Yesterday milrinone stopped, carvedilol was cut back to 3.125 mg twice a day and spiro was increased to 25 mg daily.   Denies SOB. Wants to go home.   Scheduled Meds: . aspirin EC  81 mg Oral Daily  . atorvastatin  80 mg Oral Daily  . carvedilol  3.125 mg Oral BID WC  . enoxaparin (LOVENOX) injection  40 mg Subcutaneous Q24H  . fluticasone  1 spray Each Nare Daily  . furosemide  60 mg Oral BID  . insulin aspart  0-15 Units Subcutaneous TID WC  . insulin aspart  0-5 Units Subcutaneous QHS  . insulin glargine  60 Units Subcutaneous QHS  . isosorbide-hydrALAZINE  2 tablet Oral TID  . lisinopril  5 mg Oral BID  . potassium chloride  10 mEq Oral BID  . sodium chloride  3 mL Intravenous Q12H  . spironolactone  25 mg Oral Daily   Continuous Infusions:   PRN Meds:.sodium chloride, acetaminophen, ondansetron (ZOFRAN) IV, sodium chloride  Filed Vitals:   08/01/14 0526 08/01/14 1436 08/01/14 1915 08/02/14 0413  BP: 118/64 123/62 124/74 106/72  Pulse: 90 92 89 84  Temp: 98.4 F (36.9 C) 97.9 F (36.6 C) 98.6 F (37 C) 99.2 F (37.3 C)  TempSrc: Oral Oral Oral Oral  Resp: 16 19 16 16   Height:      Weight: 211 lb 13.8 oz (96.1 kg)   212 lb 11.9 oz (96.5 kg)  SpO2: 99% 100% 100% 99%    Intake/Output Summary (Last 24 hours) at 08/02/14 0819 Last data filed at 08/02/14 [id number]  Gross per 24 hour  Intake    540 ml  Output   2825 ml  Net  -2285 ml    LABS: Basic Metabolic Panel:  Recent Labs  08/01/14 0525 08/02/14 0442  NA 138 135  K 4.5 4.2  CL 106 104  CO2 25 22  GLUCOSE 79 76  BUN 37* 39*  CREATININE 1.13 1.06  CALCIUM 9.1 9.0   RADIOLOGY: Dg Chest 2 View  07/19/2014   CLINICAL DATA:  Shortness of breath for 1 month.  EXAM: CHEST  2 VIEW  COMPARISON:  PA and lateral chest 10/21/2013 and 01/03/2012. CT chest 11/05/2008.  FINDINGS: The patient is status post CABG with an  AICD in place. There is cardiomegaly but no edema. The lungs are clear. No pneumothorax or pleural effusion.  IMPRESSION: Cardiomegaly without acute disease.   Electronically Signed   By: Marcus Johnson M.D.   On: 07/19/2014 17:01    PHYSICAL EXAM General: NAD. Standing room.  Neck: JVP 7 cm, thyroid looks thick Lungs: Clear to auscultation bilaterally with normal respiratory effort. CV: Nondisplaced PMI.  Heart regular S1/S2, no S3/S4, no murmur. No edema. TED hose in place.  Abdomen: Soft, nontender, no hepatosplenomegaly, no distention.  Neurologic: Alert and oriented x 3.  Psych: Normal affect. Extremities: No clubbing or cyanosis.   TELEMETRY: Reviewed telemetry pt in NSR   ASSESSMENT AND PLAN:  1. Acute on chronic systolic CHF: Ischemic cardiomyopathy, EF 20-25%.   2. CAD: s/p CABG. No ACS this admission. Last cath 2013 with patent grafts. No chest pain. Continue ASA and statin.  3. Diabetes: per primary service.    Voume status stable. Increase back to home lasix dose 80 mg twice a day. Renal function stable. Continue carvedilol o 3.125. Continue lisinopril 5 mg twice  a day.  Continue Bidil 2 tabs tid (will give him 30 day card). Continue spironolactone to 25 mg daily.    HF meds for d/c Lasix 80 mg twice a day  Spironolactone 25 mg daily Bidil 2 tabs tid Carvedilol 3.125 twice a day Lisinopril 5 mg twice a day   Follow up in HF clinic next Monday. Will place chart.   Marcus Johnson,Marcus Johnson, Marcus Johnson  8:19 AM 08/02/2014   Patient seen with NP, agree with the above note.  He feels good today.  Stable clinically.  Renal function ok.  I think that he can go home on the above medications with Lasix 80 mg bid.  He will need close followup, will see in 1 week.   Marcus Johnson 08/02/2014 8:33 AM

## 2014-08-02 NOTE — Progress Notes (Signed)
Inpatient Diabetes Program Recommendations  AACE/ADA: New Consensus Statement on Inpatient Glycemic Control (2013)  Target Ranges:  Prepandial:   less than 140 mg/dL      Peak postprandial:   less than 180 mg/dL (1-2 hours)      Critically ill patients:  140 - 180 mg/dL   Reason for Visit: Hypoglycemia  Results for Marcus Johnson, Marcus Johnson (MRN [id number]) as of 08/02/2014 09:51  Ref. Range 08/01/2014 16:39 08/01/2014 21:41 08/02/2014 06:34 08/02/2014 07:00  Glucose-Capillary Latest Range: 70-99 mg/dL 83 138 (H) 60 (L) 71   Hypoglycemia this am. Needs reduction in basal insulin.  Inpatient Diabetes Program Recommendations Insulin - Basal: Decrease Lantus to 55 units   Note: Will continue to follow. Thank you. Lorenda Peck, RD, LDN, CDE Inpatient Diabetes Coordinator [phone]

## 2014-08-05 ENCOUNTER — Encounter (HOSPITAL_COMMUNITY): Payer: BC Managed Care – PPO

## 2014-08-09 ENCOUNTER — Ambulatory Visit (HOSPITAL_COMMUNITY)
Admit: 2014-08-09 | Discharge: 2014-08-09 | Disposition: A | Discharge status: Home / Self Care | Payer: BLUE CROSS/BLUE SHIELD | Source: Ambulatory Visit | Attending: Internal Medicine | Admitting: Internal Medicine

## 2014-08-09 VITALS — BP 134/76 | HR 95 | Wt 208.0 lb

## 2014-08-09 DIAGNOSIS — E119 Type 2 diabetes mellitus without complications: Secondary | ICD-10-CM | POA: Insufficient documentation

## 2014-08-09 DIAGNOSIS — I10 Essential (primary) hypertension: Secondary | ICD-10-CM | POA: Insufficient documentation

## 2014-08-09 DIAGNOSIS — I5022 Chronic systolic (congestive) heart failure: Secondary | ICD-10-CM

## 2014-08-09 DIAGNOSIS — Z951 Presence of aortocoronary bypass graft: Secondary | ICD-10-CM | POA: Diagnosis not present

## 2014-08-09 DIAGNOSIS — I2510 Atherosclerotic heart disease of native coronary artery without angina pectoris: Secondary | ICD-10-CM | POA: Insufficient documentation

## 2014-08-09 LAB — BASIC METABOLIC PANEL
Anion gap: 9 (ref 5–15)
BUN: 41 mg/dL — ABNORMAL HIGH (ref 6–23)
CALCIUM: 9.4 mg/dL (ref 8.4–10.5)
CO2: 23 mmol/L (ref 19–32)
Chloride: 107 mEq/L (ref 96–112)
Creatinine, Ser: 1.12 mg/dL (ref 0.50–1.35)
GFR calc non Af Amer: 73 mL/min — ABNORMAL LOW (ref >90)
GFR, EST AFRICAN AMERICAN: 85 mL/min — AB (ref >90)
Glucose, Bld: 108 mg/dL — ABNORMAL HIGH (ref 70–99)
Potassium: 4 mmol/L (ref 3.5–5.1)
SODIUM: 139 mmol/L (ref 135–145)

## 2014-08-09 MED ORDER — LISINOPRIL 10 MG PO TABS: 10.0000 mg | ORAL_TABLET | Freq: Two times a day (BID) | ORAL | Status: DC

## 2014-08-09 MED ORDER — CARVEDILOL 12.5 MG PO TABS: 12.5000 mg | ORAL_TABLET | Freq: Two times a day (BID) | ORAL | Status: DC

## 2014-08-09 NOTE — Patient Instructions (Signed)
Increase Lisinopril to 10 mg Twice daily   Lab today and in 2 weeks (bmet)  Your physician has recommended that you have a cardiopulmonary stress test (CPX). CPX testing is a non-invasive measurement of heart and lung function. It replaces a traditional treadmill stress test. This type of test provides a tremendous amount of information that relates not only to your present condition but also for future outcomes. This test combines measurements of you ventilation, respiratory gas exchange in the lungs, electrocardiogram (EKG), blood pressure and physical response before, during, and following an exercise protocol.  Your physician recommends that you schedule a follow-up appointment in: 3-4 weeks

## 2014-08-09 NOTE — Progress Notes (Signed)
Patient ID: Marcus Johnson, male   DOB: Apr 23, 1961, 54 y.o.   MRN: [id number] PCP: Primary Cardiologist:  HPI:  Marcus Johnson is a 54 y/o male with h/o DM2, CAD s/p CABG [id number], systolic HF due to iCM EF 20-25% (echo 12/15), DM2 and CKD. He is s/p MDT single chamber ICD.  Admitted in 12/15 due to ADHF. Required short course of milrinone for diuresis. Diuresed 30 pounds. D/c weight was 212 pounds. During admission carvedilol was cut back to 3.125 bid but he has since restarted at 12.5 bid.   Here for heart f/u after recent discharge. Feels much better after diuresis.  Says he feels wonderful. Walking 1/2 mile at a time. No DOE, edema, orthopnea or PND. Weight stable 207-208. Compliant with all meds.   Echo 12/15: EF 20-25% RV moderately HK  Labs:   12/28 K+ 4.2 creatinine 1.09    ROS: All systems negative except as listed in HPI, PMH and Problem List.  SH:  History   Social History  . Marital Status: Single    Spouse Name: N/A    Number of Children: N/A  . Years of Education: N/A   Occupational History  . Not on file.   Social History Main Topics  . Smoking status: Never Smoker   . Smokeless tobacco: Never Used  . Alcohol Use: No  . Drug Use: No  . Sexual Activity: Yes   Other Topics Concern  . Not on file   Social History Narrative   Lives in [place] with his son and his family.  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    FH:  Family History  Problem Relation Age of Onset  . Diabetes Father     died in his 16's  . Hypertension Mother     died in her 70's - had a ppm.    Past Medical History  Diagnosis Date  . Hypertension   . Coronary artery disease     a. s/p CABG 2002 b. LHC 5/13:  dLM 80%, LAD subtotally occluded, pCFX occluded, pRCA 50%, mid? Occlusion with high take off of the PDA with 70% multiple lesions-not bypassed and supplies collaterals to LAD, LIMA-IM/ramus ok, S-OM ok, S-PLA branches ok. Medical therapy was recommended  .  Chronic systolic heart failure     a. Echo 5/13: Mild LVE, mild LVH, EF 10%, anteroseptal, lateral, apical AK, mild MR, mild LAE, moderately reduced RVSF, mild RAE, PASP 60;  b. 07/2014 Echo: EF 20-2%, diff HK, AKI of antsep/apical/mid-apicalinferior, mod reduced RV.  . Diabetes mellitus     type 2 requiring insulin   . MRSA (methicillin resistant Staphylococcus aureus)     Status post right foot plantar deep infection with MRSA status post  I&D 10/2008  . Noncompliance   . Peripheral neuropathy   . Ischemic cardiomyopathy     a. Echo 5/13: Mild LVE, mild LVH, EF 10%, anteroseptal, lateral, apical AK, mild MR, mild LAE, moderately reduced RVSF, mild RAE, PASP 60;  b. 01/2012 s/p MDT [id number] Protecta XT VR AICD;  c. 07/2014 Echo: EF 20-2%, diff HK, AKI of antsep/apical/mid-apicalinferior, mod reduced RV.  Marland Kitchen Single-chamber  implantable cardiac defibrillator - Medtronic   . Syncope     Current Outpatient Prescriptions  Medication Sig Dispense Refill  . aspirin EC 81 MG tablet Take 1 tablet (81 mg total) by mouth daily.    Marland Kitchen atorvastatin (LIPITOR) 80 MG tablet Take 1 tablet (80 mg total) by mouth  daily. 30 tablet 5  . carvedilol (COREG) 3.125 MG tablet Take 1 tablet (3.125 mg total) by mouth 2 (two) times daily with a meal. 60 tablet 0  . furosemide (LASIX) 40 MG tablet Take 2 tablets (80 mg total) by mouth 2 (two) times daily. 120 tablet 0  . Insulin Glargine (LANTUS) 100 UNIT/ML Solostar Pen Inject 52 Units into the skin daily at 10 pm. 15 mL 11  . isosorbide-hydrALAZINE (BIDIL) 20-37.5 MG per tablet Take 2 tablets by mouth 3 (three) times daily. 90 tablet 0  . lisinopril (PRINIVIL,ZESTRIL) 5 MG tablet Take 1 tablet (5 mg total) by mouth 2 (two) times daily. 60 tablet 0  . potassium chloride (KLOR-CON 10) 10 MEQ tablet Take 1 tablet (10 mEq total) by mouth 2 (two) times daily. 60 tablet 0  . spironolactone (ALDACTONE) 25 MG tablet Take 1 tablet (25 mg total) by mouth daily. 30 tablet 0   No  current facility-administered medications for this encounter.    Filed Vitals:   08/09/14 1607  BP: 134/76  Pulse: 95  Weight: 208 lb (94.348 kg)  SpO2: 96%    PHYSICAL EXAM:  General:  Well appearing. No resp difficulty HEENT: normal Neck: supple. JVP flat. Carotids 2+ bilaterally; no bruits. No lymphadenopathy or thryomegaly appreciated. Cor: PMI laterally displaced. Regular rate & rhythm. No rubs, gallops or murmurs. Lungs: clear Abdomen: soft, nontender, nondistended. No hepatosplenomegaly. No bruits or masses. Good bowel sounds. Extremities: no cyanosis, clubbing, rash, edema Neuro: alert & orientedx3, cranial nerves grossly intact. Moves all 4 extremities w/o difficulty. Affect pleasant.   ASSESSMENT & PLAN: 1. Chronic systolic HF due to iCM, EF 20-25% (Echo 12/15). Admit in 12.15 required short-term milrinone -- Doing very well NYHA II. Volume status looks good. Will take extra dose of lasix if weight > 210 -- Continue carvedilol 12.5 bid, Bidil 2 tabs tid, spiro 25 daily -- Titrate lisinopril to 10 bid -- In future can consider Entresto, corlanor or digoxin as need.  -- BMET today and 2 weeks. -- will need CPX 2. CAD s/p CABG  --last cath 2013 with patent grafts --continue ASA, statin 3. HTN --controlled   F/u 3-4 weeks  Benay Spice 4:15 PM

## 2014-08-09 NOTE — Addendum Note (Signed)
Encounter addended by: Scarlette Calico, RN on: 08/09/2014  4:32 PM<BR>     Documentation filed: Dx Association, Patient Instructions Section, Orders

## 2014-08-24 ENCOUNTER — Encounter: Payer: Self-pay | Admitting: Internal Medicine

## 2014-08-24 ENCOUNTER — Ambulatory Visit (INDEPENDENT_AMBULATORY_CARE_PROVIDER_SITE_OTHER): Payer: BLUE CROSS/BLUE SHIELD | Admitting: Internal Medicine

## 2014-08-24 VITALS — BP 140/80 | HR 79 | Ht 70.5 in | Wt 205.6 lb

## 2014-08-24 DIAGNOSIS — I5022 Chronic systolic (congestive) heart failure: Secondary | ICD-10-CM | POA: Diagnosis not present

## 2014-08-24 DIAGNOSIS — I10 Essential (primary) hypertension: Secondary | ICD-10-CM | POA: Diagnosis not present

## 2014-08-24 DIAGNOSIS — Z95810 Presence of automatic (implantable) cardiac defibrillator: Secondary | ICD-10-CM

## 2014-08-24 DIAGNOSIS — I255 Ischemic cardiomyopathy: Secondary | ICD-10-CM | POA: Diagnosis not present

## 2014-08-24 DIAGNOSIS — Z4502 Encounter for adjustment and management of automatic implantable cardiac defibrillator: Secondary | ICD-10-CM

## 2014-08-24 LAB — MDC_IDC_ENUM_SESS_TYPE_INCLINIC
Battery Voltage: 3.12 V
HIGH POWER IMPEDANCE MEASURED VALUE: 36 Ohm
HighPow Impedance: 171 Ohm
HighPow Impedance: 304 Ohm
HighPow Impedance: 43 Ohm
Lead Channel Impedance Value: 342 Ohm
Lead Channel Pacing Threshold Pulse Width: 0.4 ms
Lead Channel Sensing Intrinsic Amplitude: 12.125 mV
Lead Channel Sensing Intrinsic Amplitude: 12.875 mV
Lead Channel Setting Pacing Amplitude: 2.5 V
Lead Channel Setting Sensing Sensitivity: 0.3 mV
MDC IDC MSMT LEADCHNL RV PACING THRESHOLD AMPLITUDE: 0.875 V
MDC IDC SESS DTM: 20160119160740
MDC IDC SET LEADCHNL RV PACING PULSEWIDTH: 0.4 ms
MDC IDC SET ZONE DETECTION INTERVAL: 300 ms
MDC IDC STAT BRADY RV PERCENT PACED: 0 %
Zone Setting Detection Interval: 250 ms
Zone Setting Detection Interval: 370 ms

## 2014-08-24 MED ORDER — CARVEDILOL 12.5 MG PO TABS: 18.7500 mg | ORAL_TABLET | Freq: Two times a day (BID) | ORAL | Status: DC

## 2014-08-24 NOTE — Patient Instructions (Signed)
Your physician has recommended you make the following change in your medication:  1) INCREASE Carvedilol to 18.75 mg twice daily  Remote monitoring is used to monitor your Pacemaker of ICD from home. This monitoring reduces the number of office visits required to check your device to one time per year. It allows [place] to keep an eye on the functioning of your device to ensure it is working properly. You are scheduled for a device check from home on 11/23/14. You may send your transmission at any time that day. If you have a wireless device, the transmission will be sent automatically. After your physician reviews your transmission, you will receive a postcard with your next transmission date.  Your physician wants you to follow-up in: 1 year with Dr. Caryl Comes.  You will receive a reminder letter in the mail two months in advance. If you don't receive a letter, please call our office to schedule the follow-up appointment.
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
## 2014-08-25 ENCOUNTER — Ambulatory Visit (HOSPITAL_COMMUNITY)
Admission: RE | Admit: 2014-08-25 | Discharge: 2014-08-25 | Disposition: A | Discharge status: Home / Self Care | Payer: BLUE CROSS/BLUE SHIELD | Source: Ambulatory Visit | Attending: Internal Medicine | Admitting: Internal Medicine

## 2014-08-25 DIAGNOSIS — I5022 Chronic systolic (congestive) heart failure: Secondary | ICD-10-CM

## 2014-08-25 DIAGNOSIS — I255 Ischemic cardiomyopathy: Secondary | ICD-10-CM | POA: Diagnosis present

## 2014-08-25 LAB — BASIC METABOLIC PANEL
Anion gap: 10 (ref 5–15)
BUN: 34 mg/dL — ABNORMAL HIGH (ref 6–23)
CALCIUM: 9.3 mg/dL (ref 8.4–10.5)
CHLORIDE: 104 meq/L (ref 96–112)
CO2: 26 mmol/L (ref 19–32)
CREATININE: 1.34 mg/dL (ref 0.50–1.35)
GFR calc Af Amer: 68 mL/min — ABNORMAL LOW (ref >90)
GFR, EST NON AFRICAN AMERICAN: 59 mL/min — AB (ref >90)
GLUCOSE: 113 mg/dL — AB (ref 70–99)
Potassium: 4.3 mmol/L (ref 3.5–5.1)
Sodium: 140 mmol/L (ref 135–145)

## 2014-08-30 ENCOUNTER — Encounter (HOSPITAL_COMMUNITY): Payer: BLUE CROSS/BLUE SHIELD

## 2014-09-09 ENCOUNTER — Telehealth: Payer: Self-pay

## 2014-09-09 ENCOUNTER — Other Ambulatory Visit (HOSPITAL_COMMUNITY): Payer: Self-pay | Admitting: *Deleted

## 2014-09-09 MED ORDER — ISOSORB DINITRATE-HYDRALAZINE 20-37.5 MG PO TABS: 2.0000 | ORAL_TABLET | Freq: Three times a day (TID) | ORAL | Status: DC

## 2014-09-09 NOTE — Telephone Encounter (Signed)
Pt called to get refill on Bidil, he states he was given a savings card but it has expired and medication is expensive.  Advised that [facility] has paired up with [facility] and with his insurance med should only be $10 a month, rx sent in there he will let me know if he has any more issues

## 2014-09-09 NOTE — Telephone Encounter (Signed)
Rx request for Bidil oral tablet 20-37.5 mg- Take 2 tablets by mouth three times a daily #90  Pharm:  [facility]

## 2014-09-10 NOTE — Telephone Encounter (Signed)
I spoke with pt and he did get some samples from the cardiologist.

## 2014-09-17 ENCOUNTER — Ambulatory Visit (INDEPENDENT_AMBULATORY_CARE_PROVIDER_SITE_OTHER): Payer: BLUE CROSS/BLUE SHIELD | Admitting: Family Medicine

## 2014-09-17 ENCOUNTER — Encounter: Payer: Self-pay | Admitting: Family Medicine

## 2014-09-17 VITALS — BP 129/71 | HR 89 | Temp 98.1°F | Ht 70.5 in | Wt 210.0 lb

## 2014-09-17 DIAGNOSIS — I10 Essential (primary) hypertension: Secondary | ICD-10-CM

## 2014-09-17 DIAGNOSIS — E1129 Type 2 diabetes mellitus with other diabetic kidney complication: Secondary | ICD-10-CM

## 2014-09-17 DIAGNOSIS — I5021 Acute systolic (congestive) heart failure: Secondary | ICD-10-CM

## 2014-09-17 DIAGNOSIS — N058 Unspecified nephritic syndrome with other morphologic changes: Secondary | ICD-10-CM

## 2014-09-17 MED ORDER — INSULIN ASPART PROT & ASPART (70-30 MIX) 100 UNIT/ML PEN: 5.0000 [IU] | PEN_INJECTOR | Freq: Three times a day (TID) | SUBCUTANEOUS | Status: DC

## 2014-09-17 NOTE — Progress Notes (Signed)
Pre visit review using our clinic review tool, if applicable. No additional management support is needed unless otherwise documented below in the visit note. 

## 2014-09-17 NOTE — Progress Notes (Signed)
   Subjective:    Patient ID: Marcus Johnson, male    DOB: [birth year], 54 y.o.   MRN: [id number]  HPI Here to follow up a hospital stay from 07-19-14 to 08-02-14 for an acute exacerbation of CHF. He was diuresed and his medications were changed a bit. He was put on Bidil and when he followed up with Dr. Caryl Comes recently his carvedilol was increased a bit. He feels much better and has no SOB or chest pain at all. No leg edema. At the hospital his Novolog insulin was stopped for some reason and he was told to take Lantus only. This was in spite of the fact that his A1c on admission was 8.2, and this had gone up from 7.7 a few months before that. His am fasting glucoses run from 130 to 180 but he rarely checks his glucoses during the day.    Review of Systems  Constitutional: Negative.   Respiratory: Negative.   Cardiovascular: Negative.   Neurological: Negative.        Objective:   Physical Exam  Constitutional: He is oriented to person, place, and time. He appears well-developed and well-nourished.  Cardiovascular: Normal rate, regular rhythm, normal heart sounds and intact distal pulses.   Pulmonary/Chest: Effort normal and breath sounds normal.  Musculoskeletal: He exhibits no edema.  Neurological: He is alert and oriented to person, place, and time.          Assessment & Plan:  He is doing well from a cardiac standpoint. We will restart his Novolog 70/30 at 5 units TID with meals. Recheck in one month

## 2014-09-20 ENCOUNTER — Telehealth: Payer: Self-pay | Admitting: Family Medicine

## 2014-09-20 NOTE — Telephone Encounter (Signed)
emmi emailed °

## 2014-09-22 ENCOUNTER — Encounter (HOSPITAL_COMMUNITY): Payer: BLUE CROSS/BLUE SHIELD

## 2014-09-28 ENCOUNTER — Ambulatory Visit (HOSPITAL_COMMUNITY): Payer: BLUE CROSS/BLUE SHIELD | Attending: Cardiology

## 2014-09-28 DIAGNOSIS — I5022 Chronic systolic (congestive) heart failure: Secondary | ICD-10-CM | POA: Diagnosis present

## 2014-11-01 ENCOUNTER — Telehealth (HOSPITAL_COMMUNITY): Payer: Self-pay | Admitting: Vascular Surgery

## 2014-11-15 ENCOUNTER — Encounter (HOSPITAL_COMMUNITY): Payer: BLUE CROSS/BLUE SHIELD

## 2014-11-19 ENCOUNTER — Telehealth (HOSPITAL_COMMUNITY): Payer: Self-pay | Admitting: Vascular Surgery

## 2014-11-19 ENCOUNTER — Ambulatory Visit (HOSPITAL_COMMUNITY)
Admission: RE | Admit: 2014-11-19 | Discharge: 2014-11-19 | Disposition: A | Discharge status: Home / Self Care | Payer: BLUE CROSS/BLUE SHIELD | Source: Ambulatory Visit | Attending: Cardiology | Admitting: Cardiology

## 2014-11-19 VITALS — BP 122/64 | HR 82 | Wt 214.0 lb

## 2014-11-19 DIAGNOSIS — I10 Essential (primary) hypertension: Secondary | ICD-10-CM | POA: Diagnosis not present

## 2014-11-19 DIAGNOSIS — Z951 Presence of aortocoronary bypass graft: Secondary | ICD-10-CM | POA: Insufficient documentation

## 2014-11-19 DIAGNOSIS — I2510 Atherosclerotic heart disease of native coronary artery without angina pectoris: Secondary | ICD-10-CM | POA: Insufficient documentation

## 2014-11-19 DIAGNOSIS — I5022 Chronic systolic (congestive) heart failure: Secondary | ICD-10-CM | POA: Insufficient documentation

## 2014-11-19 LAB — BASIC METABOLIC PANEL
Anion gap: 8 (ref 5–15)
BUN: 33 mg/dL — ABNORMAL HIGH (ref 6–23)
CHLORIDE: 106 mmol/L (ref 96–112)
CO2: 26 mmol/L (ref 19–32)
CREATININE: 1.23 mg/dL (ref 0.50–1.35)
Calcium: 9.5 mg/dL (ref 8.4–10.5)
GFR calc Af Amer: 75 mL/min — ABNORMAL LOW (ref >90)
GFR calc non Af Amer: 65 mL/min — ABNORMAL LOW (ref >90)
Glucose, Bld: 117 mg/dL — ABNORMAL HIGH (ref 70–99)
Potassium: 4.1 mmol/L (ref 3.5–5.1)
Sodium: 140 mmol/L (ref 135–145)

## 2014-11-19 LAB — BRAIN NATRIURETIC PEPTIDE: B Natriuretic Peptide: 238 pg/mL — ABNORMAL HIGH (ref 0.0–100.0)

## 2014-11-19 MED ORDER — FUROSEMIDE 40 MG PO TABS: ORAL_TABLET | ORAL | Status: DC

## 2014-11-19 MED ORDER — SACUBITRIL-VALSARTAN 24-26 MG PO TABS: 1.0000 | ORAL_TABLET | Freq: Two times a day (BID) | ORAL | Status: DC

## 2014-11-19 NOTE — Telephone Encounter (Signed)
Pt needs Entrusto sent to [facility] on [address] , it was sent to [facility] he only uses that pharmacy for Bidil. Please advise

## 2014-11-19 NOTE — Patient Instructions (Addendum)
INCREASE Lasix to 120 mg in the AM and 80 mg in the PM STOP Lisinopril x 36 hours  START Entresto 24/26 mg one tab twice daily  Labs today and again in 10 days (BMET)  Your physician recommends that you schedule a follow-up appointment in: 1 month  Do the following things EVERYDAY: 1) Weigh yourself in the morning before breakfast. Write it down and keep it in a log. 2) Take your medicines as prescribed 3) Eat low salt foods-Limit salt (sodium) to 2000 mg per day.  4) Stay as active as you can everyday 5) Limit all fluids for the day to less than 2 liters 6)

## 2014-11-19 NOTE — Progress Notes (Signed)
Patient ID: Marcus Johnson, male   DOB: 12-07-1960, 54 y.o.   MRN: [id number] PCP: Dr. Sarajane Jews  HPI:  Marcus Johnson is a 55 y/o male with h/o DM2, CAD s/p CABG [id number], systolic HF due to ischemic cardiomyopathy with EF 20-25% (echo 12/15), DM2 and CKD. He is s/p Medtronic single chamber ICD.  Admitted in 12/15 due to ADHF. Required short course milrinone for diuresis. Diuresed 30 pounds. D/c weight was 212 pounds. During admission carvedilol was cut back to 3.125 bid but he has since restarted at 12.5 bid.   He is here for followup.  CPX 2/16 showed severely reduced functional capacity.  There is a significant disconnect between his symptoms and the CPX.  He says that he feels very good.  He is working full time at [employer]. No dyspnea walking on flat ground.  Mild dyspnea if he walks fast up steps.  No chest pain. No orthopnea or PND.  Weight is up 6 lbs.   Optivol checked today: No arrhythmic events, 2-3 hrs activity/day, fluid index < threshold and impedance trending up.   Studies: Echo 12/15: EF 20-25% RV moderately HK CPX (2/16): RER 1.10, peak VO2 8.6, VE/VCO2 slope 43.7 => severely reduced functional capacity.   Labs:  8/15 LDL 88 12/15 K+ 4.2 creatinine 1.09  1/16 K 4.3, creatinine 1.34  ROS: All systems negative except as listed in HPI, PMH and Problem List.  SH:  History   Social History  . Marital Status: Single    Spouse Name: N/A  . Number of Children: N/A  . Years of Education: N/A   Occupational History  . Not on file.   Social History Main Topics  . Smoking status: Never Smoker   . Smokeless tobacco: Never Used  . Alcohol Use: No  . Drug Use: No  . Sexual Activity: Yes   Other Topics Concern  . Not on file   Social History Narrative   Lives in [place] with his son and his family.  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    FH:  Family History  Problem Relation Age of Onset  . Diabetes Father     died in his 62's  . Hypertension  Mother     died in her 38's - had a ppm.    Past Medical History  Diagnosis Date  . Hypertension   . Coronary artery disease     a. s/p CABG 2002 b. LHC 5/13:  dLM 80%, LAD subtotally occluded, pCFX occluded, pRCA 50%, mid? Occlusion with high take off of the PDA with 70% multiple lesions-not bypassed and supplies collaterals to LAD, LIMA-IM/ramus ok, S-OM ok, S-PLA branches ok. Medical therapy was recommended  . Chronic systolic heart failure     a. Echo 5/13: Mild LVE, mild LVH, EF 10%, anteroseptal, lateral, apical AK, mild MR, mild LAE, moderately reduced RVSF, mild RAE, PASP 60;  b. 07/2014 Echo: EF 20-2%, diff HK, AKI of antsep/apical/mid-apicalinferior, mod reduced RV.  . Diabetes mellitus     type 2 requiring insulin   . MRSA (methicillin resistant Staphylococcus aureus)     Status post right foot plantar deep infection with MRSA status post  I&D 10/2008  . Noncompliance   . Peripheral neuropathy   . Ischemic cardiomyopathy     a. Echo 5/13: Mild LVE, mild LVH, EF 10%, anteroseptal, lateral, apical AK, mild MR, mild LAE, moderately reduced RVSF, mild RAE, PASP 60;  b. 01/2012 s/p MDT  [id number] Protecta XT VR AICD;  c. 07/2014 Echo: EF 20-2%, diff HK, AKI of antsep/apical/mid-apicalinferior, mod reduced RV.  Marland Kitchen Single-chamber  implantable cardiac defibrillator - Medtronic   . Syncope     Current Outpatient Prescriptions  Medication Sig Dispense Refill  . aspirin EC 81 MG tablet Take 1 tablet (81 mg total) by mouth daily.    Marland Kitchen atorvastatin (LIPITOR) 80 MG tablet Take 1 tablet (80 mg total) by mouth daily. 30 tablet 5  . carvedilol (COREG) 12.5 MG tablet Take 1.5 tablets (18.75 mg total) by mouth 2 (two) times daily with a meal. 90 tablet 11  . furosemide (LASIX) 40 MG tablet Take 3 tabs(120 mg) in the AM and take 2 tabs (80 mg) in the PM 150 tablet 2  . insulin aspart protamine - aspart (NOVOLOG MIX 70/30 FLEXPEN) (70-30) 100 UNIT/ML FlexPen Inject 0.05 mLs (5 Units total) into the skin  3 (three) times daily. 15 mL 11  . Insulin Glargine (LANTUS) 100 UNIT/ML Solostar Pen Inject 52 Units into the skin daily at 10 pm. 15 mL 11  . isosorbide-hydrALAZINE (BIDIL) 20-37.5 MG per tablet Take 2 tablets by mouth 3 (three) times daily. 180 tablet 3  . potassium chloride (KLOR-CON 10) 10 MEQ tablet Take 1 tablet (10 mEq total) by mouth 2 (two) times daily. 60 tablet 0  . spironolactone (ALDACTONE) 25 MG tablet Take 1 tablet (25 mg total) by mouth daily. 30 tablet 0  . sacubitril-valsartan (ENTRESTO) 24-26 MG Take 1 tablet by mouth 2 (two) times daily. 60 tablet 6   No current facility-administered medications for this encounter.    Filed Vitals:   11/19/14 0832  BP: 122/64  Pulse: 82  Weight: 214 lb (97.07 kg)  SpO2: 97%    PHYSICAL EXAM:  General:  Well appearing. No resp difficulty HEENT: normal Neck: supple. JVP 10 cm. Carotids 2+ bilaterally; no bruits. No lymphadenopathy or thryomegaly appreciated. Cor: PMI laterally displaced. Regular rate & rhythm. No rubs, gallops or murmurs. Lungs: clear Abdomen: soft, nontender, nondistended. No hepatosplenomegaly. No bruits or masses. Good bowel sounds. Extremities: no cyanosis, clubbing, rash, edema Neuro: alert & orientedx3, cranial nerves grossly intact. Moves all 4 extremities w/o difficulty. Affect pleasant.  ASSESSMENT & PLAN: 1. Chronic systolic HF:  Ischemic CMP, EF 20-25% (Echo 12/15). Admit in 12/15, required short-term milrinone for low output.  Medtronic ICD, narrow QRS so not CRT candidate. Markedly abnormal CPX in 2/16 with poor functional capacity due to circulatory limitation, maximal effort. On exam today, he looks volume overloaded and weight is up, but Optivol does not suggest volume overload.  Additionally, he describes NYHA class II symptoms and says that he feels quite good despite the markedly abnormal CPX.   - There is a disconnect between recent admit for inotropic support/markedly abnormal CPX and his  somewhat minimal symptoms.  I am concerned that he has a very narrow window and could decompensate quickly.  I talked to him at length today about being seen in a transplant center for initial evaluation.  I think that he would be a good candidate (young, has family support).  He wants to talk with his family and think about this, he will get back to [place].  - Increase Lasix to 120 qam/80 qpm as I think he is somewhat volume overloaded on exam.  - BMET/BNP today and repeat in 10 days.  - Continue current Coreg, Bidil, and spironolactone.  - I will stop lisinopril and have him start Entresto 24/26 bid.  2. CAD s/p CABG: Last cath 2013 with patent grafts - continue ASA, statin 3. HTN: BP not elevated.    F/u 1 month  Dalton McLean,MD 11/19/2014

## 2014-11-22 NOTE — Telephone Encounter (Signed)
Advised pt to contact [facility] and ask that rx be transferred, if he has any problems getting meds transferred we can send a new rx.     Pt also request referral for transplant be sent to [facility], pt states he was told to call back after the weekend. Pt would like to proceed with referral

## 2014-11-23 ENCOUNTER — Ambulatory Visit (INDEPENDENT_AMBULATORY_CARE_PROVIDER_SITE_OTHER): Payer: BLUE CROSS/BLUE SHIELD | Admitting: *Deleted

## 2014-11-23 DIAGNOSIS — I5022 Chronic systolic (congestive) heart failure: Secondary | ICD-10-CM

## 2014-11-23 DIAGNOSIS — I255 Ischemic cardiomyopathy: Secondary | ICD-10-CM

## 2014-11-23 NOTE — Progress Notes (Signed)
Remote ICD transmission.   

## 2014-11-26 LAB — MDC_IDC_ENUM_SESS_TYPE_REMOTE
Brady Statistic RV Percent Paced: 0 %
HighPow Impedance: 266 Ohm
HighPow Impedance: 32 Ohm
HighPow Impedance: 37 Ohm
Lead Channel Impedance Value: 323 Ohm
Lead Channel Pacing Threshold Amplitude: 0.75 V
Lead Channel Sensing Intrinsic Amplitude: 11.125 mV
Lead Channel Sensing Intrinsic Amplitude: 11.125 mV
Lead Channel Setting Pacing Amplitude: 2.5 V
Lead Channel Setting Pacing Pulse Width: 0.4 ms
Lead Channel Setting Sensing Sensitivity: 0.3 mV
MDC IDC MSMT BATTERY VOLTAGE: 3.13 V
MDC IDC MSMT LEADCHNL RV PACING THRESHOLD PULSEWIDTH: 0.4 ms
MDC IDC SESS DTM: 20160419073429
MDC IDC SET ZONE DETECTION INTERVAL: 250 ms
MDC IDC SET ZONE DETECTION INTERVAL: 370 ms
Zone Setting Detection Interval: 300 ms

## 2014-12-01 ENCOUNTER — Ambulatory Visit (HOSPITAL_COMMUNITY)
Admission: RE | Admit: 2014-12-01 | Discharge: 2014-12-01 | Disposition: A | Discharge status: Home / Self Care | Payer: BLUE CROSS/BLUE SHIELD | Source: Ambulatory Visit | Attending: Internal Medicine | Admitting: Internal Medicine

## 2014-12-01 DIAGNOSIS — I5022 Chronic systolic (congestive) heart failure: Secondary | ICD-10-CM | POA: Diagnosis not present

## 2014-12-01 LAB — BASIC METABOLIC PANEL
ANION GAP: 9 (ref 5–15)
BUN: 30 mg/dL — ABNORMAL HIGH (ref 6–23)
CHLORIDE: 101 mmol/L (ref 96–112)
CO2: 28 mmol/L (ref 19–32)
CREATININE: 1.19 mg/dL (ref 0.50–1.35)
Calcium: 9.8 mg/dL (ref 8.4–10.5)
GFR calc Af Amer: 78 mL/min — ABNORMAL LOW (ref >90)
GFR calc non Af Amer: 68 mL/min — ABNORMAL LOW (ref >90)
Glucose, Bld: 267 mg/dL — ABNORMAL HIGH (ref 70–99)
POTASSIUM: 4.5 mmol/L (ref 3.5–5.1)
SODIUM: 138 mmol/L (ref 135–145)

## 2014-12-02 ENCOUNTER — Telehealth (HOSPITAL_COMMUNITY): Payer: Self-pay | Admitting: *Deleted

## 2014-12-02 NOTE — Telephone Encounter (Signed)
PA completed for pt's Marcus Johnson, med approved was approved 11/29/14-08/05/38, ref # [id number], pt aware

## 2014-12-20 ENCOUNTER — Encounter: Payer: Self-pay | Admitting: Internal Medicine

## 2014-12-20 ENCOUNTER — Ambulatory Visit (HOSPITAL_COMMUNITY)
Admission: RE | Admit: 2014-12-20 | Discharge: 2014-12-20 | Disposition: A | Discharge status: Home / Self Care | Payer: BLUE CROSS/BLUE SHIELD | Source: Ambulatory Visit | Attending: Internal Medicine | Admitting: Internal Medicine

## 2014-12-20 VITALS — BP 118/66 | HR 95 | Wt 212.5 lb

## 2014-12-20 DIAGNOSIS — I5022 Chronic systolic (congestive) heart failure: Secondary | ICD-10-CM | POA: Insufficient documentation

## 2014-12-20 DIAGNOSIS — I2510 Atherosclerotic heart disease of native coronary artery without angina pectoris: Secondary | ICD-10-CM | POA: Diagnosis not present

## 2014-12-20 LAB — BASIC METABOLIC PANEL
Anion gap: 11 (ref 5–15)
BUN: 38 mg/dL — ABNORMAL HIGH (ref 6–20)
CHLORIDE: 100 mmol/L — AB (ref 101–111)
CO2: 26 mmol/L (ref 22–32)
CREATININE: 1.37 mg/dL — AB (ref 0.61–1.24)
Calcium: 9.5 mg/dL (ref 8.9–10.3)
GFR calc Af Amer: >60 mL/min (ref >60)
GFR calc non Af Amer: 57 mL/min — ABNORMAL LOW (ref >60)
Glucose, Bld: 318 mg/dL — ABNORMAL HIGH (ref 65–99)
POTASSIUM: 4.6 mmol/L (ref 3.5–5.1)
SODIUM: 137 mmol/L (ref 135–145)

## 2014-12-20 LAB — LIPID PANEL
CHOL/HDL RATIO: 6.1 ratio
Cholesterol: 170 mg/dL (ref 0–200)
HDL: 28 mg/dL — ABNORMAL LOW (ref >40)
LDL CALC: 108 mg/dL — AB (ref 0–99)
Triglycerides: 169 mg/dL — ABNORMAL HIGH (ref <150)
VLDL: 34 mg/dL (ref 0–40)

## 2014-12-20 LAB — BRAIN NATRIURETIC PEPTIDE: B Natriuretic Peptide: 260.7 pg/mL — ABNORMAL HIGH (ref 0.0–100.0)

## 2014-12-20 MED ORDER — SACUBITRIL-VALSARTAN 49-51 MG PO TABS: 1.0000 | ORAL_TABLET | Freq: Two times a day (BID) | ORAL | Status: DC

## 2014-12-20 NOTE — Progress Notes (Signed)
Patient ID: DAEJOHN AUMENT, male   DOB: 1961/07/24, 54 y.o.   MRN: [id number] PCP: Dr. Sarajane Jews  HPI:  Mr. Noria is a 54 y/o male with h/o DM2, CAD s/p CABG 123XX123, systolic HF due to ischemic cardiomyopathy with EF 20-25% (echo 12/15), DM2 and CKD. He is s/p Medtronic single chamber ICD.  Admitted in 12/15 due to ADHF. Required short course milrinone for diuresis. Diuresed 30 pounds. D/c weight was 212 pounds. During admission carvedilol was cut back to 3.125 bid but he has since restarted at 12.5 bid.   He is here for followup.  CPX 2/16 showed severely reduced functional capacity.  There is a significant disconnect between his symptoms and the CPX.  He says that he feels very good.  He is working full time at [employer]. No dyspnea walking on flat ground.  Mild dyspnea if he walks fast up steps.  He has been walking 1 mile several times a week.  No chest pain. No orthopnea or PND.  At last appointment, he had some volume overload and Lasix was increased.  Weight is down 2 lbs.    Optivol checked today: No arrhythmic events,  fluid index < threshold and impedance trending up.   Studies: Echo (12/15): EF 20-25% RV moderately HK CPX (2/16): RER 1.10, peak VO2 8.6, VE/VCO2 slope 43.7 => severely reduced functional capacity.   Labs:  8/15 LDL 88 12/15 K+ 4.2 creatinine 1.09  1/16 K 4.3, creatinine 1.34 4/16 K 4.5, creatinine 1.19  ROS: All systems negative except as listed in HPI, PMH and Problem List.  SH:  History   Social History  . Marital Status: Single    Spouse Name: N/A  . Number of Children: N/A  . Years of Education: N/A   Occupational History  . Not on file.   Social History Main Topics  . Smoking status: Never Smoker   . Smokeless tobacco: Never Used  . Alcohol Use: No  . Drug Use: No  . Sexual Activity: Yes   Other Topics Concern  . Not on file   Social History Narrative   Lives in [place] with his son and his family.  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    FH:  Family History  Problem Relation Age of Onset  . Diabetes Father     died in his 26's  . Hypertension Mother     died in her 28's - had a ppm.    Past Medical History  Diagnosis Date  . Hypertension   . Coronary artery disease     a. s/p CABG 2002 b. LHC 5/13:  dLM 80%, LAD subtotally occluded, pCFX occluded, pRCA 50%, mid? Occlusion with high take off of the PDA with 70% multiple lesions-not bypassed and supplies collaterals to LAD, LIMA-IM/ramus ok, S-OM ok, S-PLA branches ok. Medical therapy was recommended  . Chronic systolic heart failure     a. Echo 5/13: Mild LVE, mild LVH, EF 10%, anteroseptal, lateral, apical AK, mild MR, mild LAE, moderately reduced RVSF, mild RAE, PASP 60;  b. 07/2014 Echo: EF 20-2%, diff HK, AKI of antsep/apical/mid-apicalinferior, mod reduced RV.  . Diabetes mellitus     type 2 requiring insulin   . MRSA (methicillin resistant Staphylococcus aureus)     Status post right foot plantar deep infection with MRSA status post  I&D 10/2008  . Noncompliance   . Peripheral neuropathy   . Ischemic cardiomyopathy     a.  Echo 5/13: Mild LVE, mild LVH, EF 10%, anteroseptal, lateral, apical AK, mild MR, mild LAE, moderately reduced RVSF, mild RAE, PASP 60;  b. 01/2012 s/p MDT [id number] Protecta XT VR AICD;  c. 07/2014 Echo: EF 20-2%, diff HK, AKI of antsep/apical/mid-apicalinferior, mod reduced RV.  Marland Kitchen Single-chamber  implantable cardiac defibrillator - Medtronic   . Syncope     Current Outpatient Prescriptions  Medication Sig Dispense Refill  . aspirin EC 81 MG tablet Take 1 tablet (81 mg total) by mouth daily.    Marland Kitchen atorvastatin (LIPITOR) 80 MG tablet Take 1 tablet (80 mg total) by mouth daily. 30 tablet 5  . carvedilol (COREG) 12.5 MG tablet Take 1.5 tablets (18.75 mg total) by mouth 2 (two) times daily with a meal. 90 tablet 11  . furosemide (LASIX) 40 MG tablet Take 3 tabs(120 mg) in the AM and take 2 tabs (80 mg) in the PM 150 tablet 2   . insulin aspart protamine - aspart (NOVOLOG MIX 70/30 FLEXPEN) (70-30) 100 UNIT/ML FlexPen Inject 0.05 mLs (5 Units total) into the skin 3 (three) times daily. 15 mL 11  . Insulin Glargine (LANTUS) 100 UNIT/ML Solostar Pen Inject 52 Units into the skin daily at 10 pm. 15 mL 11  . isosorbide-hydrALAZINE (BIDIL) 20-37.5 MG per tablet Take 2 tablets by mouth 3 (three) times daily. 180 tablet 3  . potassium chloride (KLOR-CON 10) 10 MEQ tablet Take 1 tablet (10 mEq total) by mouth 2 (two) times daily. 60 tablet 0  . spironolactone (ALDACTONE) 25 MG tablet Take 1 tablet (25 mg total) by mouth daily. 30 tablet 0  . sacubitril-valsartan (ENTRESTO) 49-51 MG Take 1 tablet by mouth 2 (two) times daily. 60 tablet 6   No current facility-administered medications for this encounter.    Filed Vitals:   12/20/14 0845  BP: 118/66  Pulse: 95  Weight: 212 lb 8 oz (96.389 kg)  SpO2: 99%    PHYSICAL EXAM:  General:  Well appearing. No resp difficulty HEENT: normal Neck: supple. JVP not elevated. Carotids 2+ bilaterally; no bruits. No lymphadenopathy or thryomegaly appreciated. Cor: PMI laterally displaced. Regular rate & rhythm. No rubs, gallops or murmurs. Lungs: clear Abdomen: soft, nontender, nondistended. No hepatosplenomegaly. No bruits or masses. Good bowel sounds. Extremities: no cyanosis, clubbing, rash, edema Neuro: alert & orientedx3, cranial nerves grossly intact. Moves all 4 extremities w/o difficulty. Affect pleasant.  ASSESSMENT & PLAN: 1. Chronic systolic HF:  Ischemic CMP, EF 20-25% (Echo 12/15). Admit in 12/15, required short-term milrinone for low output.  Medtronic ICD, narrow QRS so not CRT candidate. Markedly abnormal CPX in 2/16 with poor functional capacity due to circulatory limitation, maximal effort. On exam today, he does not look volume overloaded (Lasix increased last appt). He describes NYHA class II symptoms and says that he feels quite good despite the markedly abnormal  CPX.   - There is a disconnect between recent admit for inotropic support/markedly abnormal CPX and his somewhat minimal symptoms.  I am concerned that he has a very narrow window and could decompensate quickly.  We have discussed transplant evaluation.  I think that he would be a good candidate (young, has family support).  He is going to be seen for an initial consultation at [facility].  - Continue Lasix 120 qam/80 qpm.  - BMET/BNP today and repeat in 2 weeks.  - Continue current Coreg, Bidil, and spironolactone.  - Increase Entresto to 49/51 bid.  2. CAD s/p CABG: Last cath 2013 with patent grafts.  No chest pain.  - continue ASA, statin - Check lipids today.  3. HTN: BP not elevated.    F/u 2 months  Theador Hawthorne 12/20/2014

## 2014-12-20 NOTE — Patient Instructions (Signed)
Increase Entresto to 49/51 mg Twice daily, we have sent you in a new prescription for this strength tablet  Labs today  Labs in 2 weeks  We will contact you in 2 months to schedule your next appointment.

## 2014-12-24 ENCOUNTER — Encounter: Payer: Self-pay | Admitting: Cardiology

## 2014-12-28 ENCOUNTER — Encounter: Payer: Self-pay | Admitting: Internal Medicine

## 2014-12-30 ENCOUNTER — Telehealth (HOSPITAL_COMMUNITY): Payer: Self-pay | Admitting: Cardiology

## 2014-12-30 ENCOUNTER — Other Ambulatory Visit (HOSPITAL_COMMUNITY)
Admission: RE | Admit: 2014-12-30 | Discharge: 2014-12-30 | Disposition: A | Discharge status: Home / Self Care | Payer: BLUE CROSS/BLUE SHIELD | Source: Ambulatory Visit | Attending: Cardiology | Admitting: Cardiology

## 2014-12-30 ENCOUNTER — Encounter (HOSPITAL_COMMUNITY): Payer: Self-pay | Admitting: Cardiology

## 2014-12-30 ENCOUNTER — Inpatient Hospital Stay (HOSPITAL_COMMUNITY): Admission: RE | Admit: 2014-12-30 | Payer: BLUE CROSS/BLUE SHIELD | Source: Ambulatory Visit

## 2014-12-30 ENCOUNTER — Other Ambulatory Visit (HOSPITAL_COMMUNITY): Payer: Self-pay

## 2014-12-30 ENCOUNTER — Ambulatory Visit (HOSPITAL_COMMUNITY)
Admission: RE | Admit: 2014-12-30 | Discharge: 2014-12-30 | Disposition: A | Discharge status: Home / Self Care | Payer: BLUE CROSS/BLUE SHIELD | Source: Ambulatory Visit | Attending: Cardiology | Admitting: Cardiology

## 2014-12-30 DIAGNOSIS — I5022 Chronic systolic (congestive) heart failure: Secondary | ICD-10-CM | POA: Diagnosis not present

## 2014-12-30 DIAGNOSIS — I5020 Unspecified systolic (congestive) heart failure: Secondary | ICD-10-CM | POA: Insufficient documentation

## 2014-12-30 LAB — BASIC METABOLIC PANEL
Anion gap: 10 (ref 5–15)
BUN: 60 mg/dL — AB (ref 6–20)
CO2: 22 mmol/L (ref 22–32)
Calcium: 9.9 mg/dL (ref 8.9–10.3)
Chloride: 103 mmol/L (ref 101–111)
Creatinine, Ser: 1.34 mg/dL — ABNORMAL HIGH (ref 0.61–1.24)
GFR calc non Af Amer: 59 mL/min — ABNORMAL LOW (ref >60)
GLUCOSE: 347 mg/dL — AB (ref 65–99)
Potassium: 5.2 mmol/L — ABNORMAL HIGH (ref 3.5–5.1)
SODIUM: 135 mmol/L (ref 135–145)

## 2014-12-30 NOTE — Telephone Encounter (Signed)
Pt scheduled for [facility] on 01/06/2015 with Dr.McLean Cpt code 93453 With pts current insurance-[employer] No pre cert is required per web portal

## 2014-12-30 NOTE — Addendum Note (Signed)
Encounter addended by: Scarlette Calico, RN on: 12/30/2014  9:31 AM<BR>     Documentation filed: [employer], Orders

## 2014-12-31 ENCOUNTER — Telehealth (HOSPITAL_COMMUNITY): Payer: Self-pay | Admitting: Cardiology

## 2014-12-31 ENCOUNTER — Other Ambulatory Visit (HOSPITAL_COMMUNITY): Payer: Self-pay

## 2014-12-31 ENCOUNTER — Other Ambulatory Visit (HOSPITAL_COMMUNITY): Payer: BLUE CROSS/BLUE SHIELD

## 2014-12-31 DIAGNOSIS — I5022 Chronic systolic (congestive) heart failure: Secondary | ICD-10-CM

## 2014-12-31 DIAGNOSIS — I5020 Unspecified systolic (congestive) heart failure: Secondary | ICD-10-CM

## 2014-12-31 MED ORDER — SPIRONOLACTONE 25 MG PO TABS: 12.5000 mg | ORAL_TABLET | Freq: Every day | ORAL | Status: DC

## 2014-12-31 MED ORDER — FUROSEMIDE 40 MG PO TABS: ORAL_TABLET | ORAL | Status: DC

## 2014-12-31 NOTE — Progress Notes (Signed)
Left lab results on vm will try pt again

## 2014-12-31 NOTE — Telephone Encounter (Signed)
-----   Message from Larey Dresser, MD sent at 12/30/2014  5:23 PM EDT ----- Call patient please.  Stop supplemental KCl.  Decrease spironolactone to 12.5 mg daily.  Hold Lasix x 1 day, then stop pm Lasix (take 120 mg qam only).

## 2014-12-31 NOTE — Telephone Encounter (Signed)
Pt aware of med changes and voiced understanding

## 2015-01-05 ENCOUNTER — Other Ambulatory Visit (HOSPITAL_COMMUNITY): Payer: BLUE CROSS/BLUE SHIELD

## 2015-01-06 ENCOUNTER — Other Ambulatory Visit (HOSPITAL_COMMUNITY): Payer: BLUE CROSS/BLUE SHIELD

## 2015-01-13 NOTE — Telephone Encounter (Signed)
OPEN IN ERROR 

## 2015-01-18 ENCOUNTER — Ambulatory Visit (HOSPITAL_COMMUNITY)
Admission: RE | Admit: 2015-01-18 | Discharge: 2015-01-18 | Disposition: A | Discharge status: Home / Self Care | Payer: BLUE CROSS/BLUE SHIELD | Source: Ambulatory Visit | Attending: Cardiology | Admitting: Cardiology

## 2015-01-18 ENCOUNTER — Encounter (HOSPITAL_COMMUNITY): Payer: Self-pay | Admitting: Cardiology

## 2015-01-18 ENCOUNTER — Encounter (HOSPITAL_COMMUNITY)
Admission: RE | Disposition: A | Discharge status: Home / Self Care | Payer: BLUE CROSS/BLUE SHIELD | Source: Ambulatory Visit | Attending: Cardiology

## 2015-01-18 DIAGNOSIS — I255 Ischemic cardiomyopathy: Secondary | ICD-10-CM | POA: Diagnosis not present

## 2015-01-18 DIAGNOSIS — Z7982 Long term (current) use of aspirin: Secondary | ICD-10-CM | POA: Diagnosis not present

## 2015-01-18 DIAGNOSIS — E1122 Type 2 diabetes mellitus with diabetic chronic kidney disease: Secondary | ICD-10-CM | POA: Diagnosis not present

## 2015-01-18 DIAGNOSIS — Z794 Long term (current) use of insulin: Secondary | ICD-10-CM | POA: Diagnosis not present

## 2015-01-18 DIAGNOSIS — Z7682 Awaiting organ transplant status: Secondary | ICD-10-CM | POA: Insufficient documentation

## 2015-01-18 DIAGNOSIS — Z95810 Presence of automatic (implantable) cardiac defibrillator: Secondary | ICD-10-CM | POA: Insufficient documentation

## 2015-01-18 DIAGNOSIS — I509 Heart failure, unspecified: Secondary | ICD-10-CM | POA: Diagnosis not present

## 2015-01-18 DIAGNOSIS — Z01810 Encounter for preprocedural cardiovascular examination: Secondary | ICD-10-CM | POA: Diagnosis present

## 2015-01-18 DIAGNOSIS — Z951 Presence of aortocoronary bypass graft: Secondary | ICD-10-CM | POA: Insufficient documentation

## 2015-01-18 DIAGNOSIS — Z8614 Personal history of Methicillin resistant Staphylococcus aureus infection: Secondary | ICD-10-CM | POA: Insufficient documentation

## 2015-01-18 DIAGNOSIS — I129 Hypertensive chronic kidney disease with stage 1 through stage 4 chronic kidney disease, or unspecified chronic kidney disease: Secondary | ICD-10-CM | POA: Diagnosis not present

## 2015-01-18 DIAGNOSIS — N189 Chronic kidney disease, unspecified: Secondary | ICD-10-CM | POA: Insufficient documentation

## 2015-01-18 DIAGNOSIS — I5022 Chronic systolic (congestive) heart failure: Secondary | ICD-10-CM | POA: Insufficient documentation

## 2015-01-18 DIAGNOSIS — I2510 Atherosclerotic heart disease of native coronary artery without angina pectoris: Secondary | ICD-10-CM | POA: Diagnosis not present

## 2015-01-18 DIAGNOSIS — I5020 Unspecified systolic (congestive) heart failure: Secondary | ICD-10-CM

## 2015-01-18 DIAGNOSIS — E1142 Type 2 diabetes mellitus with diabetic polyneuropathy: Secondary | ICD-10-CM | POA: Insufficient documentation

## 2015-01-18 HISTORY — PX: CARDIAC CATHETERIZATION: SHX172

## 2015-01-18 LAB — CBC
HCT: 38.9 % — ABNORMAL LOW (ref 39.0–52.0)
Hemoglobin: 13 g/dL (ref 13.0–17.0)
MCH: 26.2 pg (ref 26.0–34.0)
MCHC: 33.4 g/dL (ref 30.0–36.0)
MCV: 78.3 fL (ref 78.0–100.0)
PLATELETS: 156 10*3/uL (ref 150–400)
RBC: 4.97 MIL/uL (ref 4.22–5.81)
RDW: 14.3 % (ref 11.5–15.5)
WBC: 6.9 10*3/uL (ref 4.0–10.5)

## 2015-01-18 LAB — BASIC METABOLIC PANEL
Anion gap: 9 (ref 5–15)
BUN: 36 mg/dL — ABNORMAL HIGH (ref 6–20)
CO2: 26 mmol/L (ref 22–32)
CREATININE: 1.21 mg/dL (ref 0.61–1.24)
Calcium: 9.4 mg/dL (ref 8.9–10.3)
Chloride: 105 mmol/L (ref 101–111)
GFR calc Af Amer: >60 mL/min (ref >60)
GFR calc non Af Amer: >60 mL/min (ref >60)
Glucose, Bld: 137 mg/dL — ABNORMAL HIGH (ref 65–99)
Potassium: 3.8 mmol/L (ref 3.5–5.1)
Sodium: 140 mmol/L (ref 135–145)

## 2015-01-18 LAB — PROTIME-INR
INR: 1.17 (ref 0.00–1.49)
Prothrombin Time: 15.1 seconds (ref 11.6–15.2)

## 2015-01-18 LAB — GLUCOSE, CAPILLARY
GLUCOSE-CAPILLARY: 124 mg/dL — AB (ref 65–99)
Glucose-Capillary: 130 mg/dL — ABNORMAL HIGH (ref 65–99)

## 2015-01-18 LAB — POCT I-STAT 3, VENOUS BLOOD GAS (G3P V)
Acid-base deficit: 3 mmol/L — ABNORMAL HIGH (ref 0.0–2.0)
Bicarbonate: 23.7 mEq/L (ref 20.0–24.0)
O2 SAT: 60 %
PCO2 VEN: 46.1 mmHg (ref 45.0–50.0)
TCO2: 25 mmol/L (ref 0–100)
pH, Ven: 7.32 — ABNORMAL HIGH (ref 7.250–7.300)
pO2, Ven: 34 mmHg (ref 30.0–45.0)

## 2015-01-18 SURGERY — RIGHT HEART CATH
Anesthesia: LOCAL

## 2015-01-18 MED ORDER — FENTANYL CITRATE (PF) 100 MCG/2ML IJ SOLN
INTRAMUSCULAR | Status: AC
  Filled 2015-01-18: qty 2

## 2015-01-18 MED ORDER — LIDOCAINE HCL (PF) 1 % IJ SOLN
INTRAMUSCULAR | Status: DC | PRN
  Administered 2015-01-18: 20 mL via SUBCUTANEOUS
  Administered 2015-01-18: 5 mL via SUBCUTANEOUS

## 2015-01-18 MED ORDER — MIDAZOLAM HCL 2 MG/2ML IJ SOLN
INTRAMUSCULAR | Status: AC
  Filled 2015-01-18: qty 2

## 2015-01-18 MED ORDER — SODIUM CHLORIDE 0.9 % IV SOLN: 250.0000 mL | INTRAVENOUS | Status: DC | PRN

## 2015-01-18 MED ORDER — LIDOCAINE HCL (PF) 1 % IJ SOLN
INTRAMUSCULAR | Status: AC
  Filled 2015-01-18: qty 30

## 2015-01-18 MED ORDER — SODIUM CHLORIDE 0.9 % IJ SOLN: 3.0000 mL | INTRAMUSCULAR | Status: DC | PRN

## 2015-01-18 MED ORDER — ACETAMINOPHEN 325 MG PO TABS: 650.0000 mg | ORAL_TABLET | ORAL | Status: DC | PRN

## 2015-01-18 MED ORDER — MIDAZOLAM HCL 2 MG/2ML IJ SOLN
INTRAMUSCULAR | Status: DC | PRN
  Administered 2015-01-18: 1 mg via INTRAVENOUS

## 2015-01-18 MED ORDER — SODIUM CHLORIDE 0.9 % IJ SOLN: 3.0000 mL | Freq: Two times a day (BID) | INTRAMUSCULAR | Status: DC

## 2015-01-18 MED ORDER — FENTANYL CITRATE (PF) 100 MCG/2ML IJ SOLN
INTRAMUSCULAR | Status: DC | PRN
  Administered 2015-01-18: 25 ug via INTRAVENOUS

## 2015-01-18 MED ORDER — SODIUM CHLORIDE 0.9 % IV SOLN
INTRAVENOUS | Status: DC
  Administered 2015-01-18: 07:00:00 via INTRAVENOUS

## 2015-01-18 MED ORDER — HEPARIN (PORCINE) IN NACL 2-0.9 UNIT/ML-% IJ SOLN
INTRAMUSCULAR | Status: AC
  Filled 2015-01-18: qty 1000

## 2015-01-18 MED ORDER — ONDANSETRON HCL 4 MG/2ML IJ SOLN: 4.0000 mg | Freq: Four times a day (QID) | INTRAMUSCULAR | Status: DC | PRN

## 2015-01-18 SURGICAL SUPPLY — 8 items
CATH BALLN WEDGE 5F 110CM (CATHETERS) ×2 IMPLANT
CATH SWAN GANZ 7F STRAIGHT (CATHETERS) ×1 IMPLANT
PACK CARDIAC CATHETERIZATION (CUSTOM PROCEDURE TRAY) ×2 IMPLANT
SHEATH FAST CATH BRACH 5F 5CM (SHEATH) ×2 IMPLANT
SHEATH PINNACLE 7F 10CM (SHEATH) ×1 IMPLANT
TRANSDUCER W/STOPCOCK (MISCELLANEOUS) ×3 IMPLANT
TUBING ART PRESS 72  MALE/FEM (TUBING) ×1
TUBING ART PRESS 72 MALE/FEM (TUBING) IMPLANT

## 2015-01-18 NOTE — Interval H&P Note (Signed)
History and Physical Interval Note:  01/18/2015 7:56 AM  Marcus Johnson  has presented today for surgery, with the diagnosis of chf  The various methods of treatment have been discussed with the patient and family. After consideration of risks, benefits and other options for treatment, the patient has consented to  Procedure(s): Right Heart Cath (N/A) as a surgical intervention .  The patient's history has been reviewed, patient examined, no change in status, stable for surgery.  I have reviewed the patient's chart and labs.  Questions were answered to the patient's satisfaction.     Delayza Lungren [employer]

## 2015-01-18 NOTE — H&P (View-Only) (Signed)
Patient ID: OSKAR MANRIQUEZ, male   DOB: [birth year], 54 y.o.   MRN: [id number] PCP: Dr. Sarajane Jews  HPI:  Mr. Reges is a 54 y/o male with h/o DM2, CAD s/p CABG 123XX123, systolic HF due to ischemic cardiomyopathy with EF 20-25% (echo 12/15), DM2 and CKD. He is s/p Medtronic single chamber ICD.  Admitted in 12/15 due to ADHF. Required short course milrinone for diuresis. Diuresed 30 pounds. D/c weight was 212 pounds. During admission carvedilol was cut back to 3.125 bid but he has since restarted at 12.5 bid.   He is here for followup.  CPX 2/16 showed severely reduced functional capacity.  There is a significant disconnect between his symptoms and the CPX.  He says that he feels very good.  He is working full time at [employer]. No dyspnea walking on flat ground.  Mild dyspnea if he walks fast up steps.  He has been walking 1 mile several times a week.  No chest pain. No orthopnea or PND.  At last appointment, he had some volume overload and Lasix was increased.  Weight is down 2 lbs.    Optivol checked today: No arrhythmic events,  fluid index < threshold and impedance trending up.   Studies: Echo (12/15): EF 20-25% RV moderately HK CPX (2/16): RER 1.10, peak VO2 8.6, VE/VCO2 slope 43.7 => severely reduced functional capacity.   Labs:  8/15 LDL 88 12/15 K+ 4.2 creatinine 1.09  1/16 K 4.3, creatinine 1.34 4/16 K 4.5, creatinine 1.19  ROS: All systems negative except as listed in HPI, PMH and Problem List.  SH:  History   Social History  . Marital Status: Single    Spouse Name: N/A  . Number of Children: N/A  . Years of Education: N/A   Occupational History  . Not on file.   Social History Main Topics  . Smoking status: Never Smoker   . Smokeless tobacco: Never Used  . Alcohol Use: No  . Drug Use: No  . Sexual Activity: Yes   Other Topics Concern  . Not on file   Social History Narrative   Lives in [place] with his son and his family.  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    FH:  Family History  Problem Relation Age of Onset  . Diabetes Father     died in his 37's  . Hypertension Mother     died in her 55's - had a ppm.    Past Medical History  Diagnosis Date  . Hypertension   . Coronary artery disease     a. s/p CABG 2002 b. LHC 5/13:  dLM 80%, LAD subtotally occluded, pCFX occluded, pRCA 50%, mid? Occlusion with high take off of the PDA with 70% multiple lesions-not bypassed and supplies collaterals to LAD, LIMA-IM/ramus ok, S-OM ok, S-PLA branches ok. Medical therapy was recommended  . Chronic systolic heart failure     a. Echo 5/13: Mild LVE, mild LVH, EF 10%, anteroseptal, lateral, apical AK, mild MR, mild LAE, moderately reduced RVSF, mild RAE, PASP 60;  b. 07/2014 Echo: EF 20-2%, diff HK, AKI of antsep/apical/mid-apicalinferior, mod reduced RV.  . Diabetes mellitus     type 2 requiring insulin   . MRSA (methicillin resistant Staphylococcus aureus)     Status post right foot plantar deep infection with MRSA status post  I&D 10/2008  . Noncompliance   . Peripheral neuropathy   . Ischemic cardiomyopathy     a.  Echo 5/13: Mild LVE, mild LVH, EF 10%, anteroseptal, lateral, apical AK, mild MR, mild LAE, moderately reduced RVSF, mild RAE, PASP 60;  b. 01/2012 s/p MDT [id number] Protecta XT VR AICD;  c. 07/2014 Echo: EF 20-2%, diff HK, AKI of antsep/apical/mid-apicalinferior, mod reduced RV.  Marland Kitchen Single-chamber  implantable cardiac defibrillator - Medtronic   . Syncope     Current Outpatient Prescriptions  Medication Sig Dispense Refill  . aspirin EC 81 MG tablet Take 1 tablet (81 mg total) by mouth daily.    Marland Kitchen atorvastatin (LIPITOR) 80 MG tablet Take 1 tablet (80 mg total) by mouth daily. 30 tablet 5  . carvedilol (COREG) 12.5 MG tablet Take 1.5 tablets (18.75 mg total) by mouth 2 (two) times daily with a meal. 90 tablet 11  . furosemide (LASIX) 40 MG tablet Take 3 tabs(120 mg) in the AM and take 2 tabs (80 mg) in the PM 150 tablet 2   . insulin aspart protamine - aspart (NOVOLOG MIX 70/30 FLEXPEN) (70-30) 100 UNIT/ML FlexPen Inject 0.05 mLs (5 Units total) into the skin 3 (three) times daily. 15 mL 11  . Insulin Glargine (LANTUS) 100 UNIT/ML Solostar Pen Inject 52 Units into the skin daily at 10 pm. 15 mL 11  . isosorbide-hydrALAZINE (BIDIL) 20-37.5 MG per tablet Take 2 tablets by mouth 3 (three) times daily. 180 tablet 3  . potassium chloride (KLOR-CON 10) 10 MEQ tablet Take 1 tablet (10 mEq total) by mouth 2 (two) times daily. 60 tablet 0  . spironolactone (ALDACTONE) 25 MG tablet Take 1 tablet (25 mg total) by mouth daily. 30 tablet 0  . sacubitril-valsartan (ENTRESTO) 49-51 MG Take 1 tablet by mouth 2 (two) times daily. 60 tablet 6   No current facility-administered medications for this encounter.    Filed Vitals:   12/20/14 0845  BP: 118/66  Pulse: 95  Weight: 212 lb 8 oz (96.389 kg)  SpO2: 99%    PHYSICAL EXAM:  General:  Well appearing. No resp difficulty HEENT: normal Neck: supple. JVP not elevated. Carotids 2+ bilaterally; no bruits. No lymphadenopathy or thryomegaly appreciated. Cor: PMI laterally displaced. Regular rate & rhythm. No rubs, gallops or murmurs. Lungs: clear Abdomen: soft, nontender, nondistended. No hepatosplenomegaly. No bruits or masses. Good bowel sounds. Extremities: no cyanosis, clubbing, rash, edema Neuro: alert & orientedx3, cranial nerves grossly intact. Moves all 4 extremities w/o difficulty. Affect pleasant.  ASSESSMENT & PLAN: 1. Chronic systolic HF:  Ischemic CMP, EF 20-25% (Echo 12/15). Admit in 12/15, required short-term milrinone for low output.  Medtronic ICD, narrow QRS so not CRT candidate. Markedly abnormal CPX in 2/16 with poor functional capacity due to circulatory limitation, maximal effort. On exam today, he does not look volume overloaded (Lasix increased last appt). He describes NYHA class II symptoms and says that he feels quite good despite the markedly abnormal  CPX.   - There is a disconnect between recent admit for inotropic support/markedly abnormal CPX and his somewhat minimal symptoms.  I am concerned that he has a very narrow window and could decompensate quickly.  We have discussed transplant evaluation.  I think that he would be a good candidate (young, has family support).  He is going to be seen for an initial consultation at [facility].  - Continue Lasix 120 qam/80 qpm.  - BMET/BNP today and repeat in 2 weeks.  - Continue current Coreg, Bidil, and spironolactone.  - Increase Entresto to 49/51 bid.  2. CAD s/p CABG: Last cath 2013 with patent grafts.  No chest pain.  - continue ASA, statin - Check lipids today.  3. HTN: BP not elevated.    F/u 2 months  Theador Hawthorne 12/20/2014

## 2015-01-18 NOTE — Progress Notes (Signed)
Site area: right groin a 7 french venous sheath was removed  Site Prior to Removal:  Level 0  Pressure Applied For 15 MINUTES    Minutes Beginning at 0845a  Manual:   Yes.    Patient Status During Pull:  stable  Post Pull Groin Site:  Level 0  Post Pull Instructions Given:  Yes.    Post Pull Pulses Present:  Yes.    Dressing Applied:  Yes.    Comments:  VS remains stable during sheath pull.  Pt denies any discomfort at site at this time.

## 2015-01-18 NOTE — Discharge Instructions (Signed)
Angiogram An angiogram, also called angiography, is a procedure used to look at the blood vessels that carry blood to different parts of your body (arteries). In this procedure, dye is injected through a long, thin tube (catheter) into an artery. X-rays are then taken. The X-rays will show if there is a blockage or problem in a blood vessel.  LET [facility] CARE PROVIDER KNOW ABOUT:  Any allergies you have, including allergies to shellfish or contrast dye.   All medicines you are taking, including vitamins, herbs, eye drops, creams, and over-the-counter medicines.   Previous problems you or members of your family have had with the use of anesthetics.   Any blood disorders you have.   Previous surgeries you have had.  Any previous kidney problems or failure you have had.  Medical conditions you have.   Possibility of pregnancy, if this applies. RISKS AND COMPLICATIONS Generally, an angiogram is a safe procedure. However, as with any procedure, problems can occur. Possible problems include:  Injury to the blood vessels, including rupture or bleeding.  Infection or bruising at the catheter site.  Allergic reaction to the dye or contrast used.  Kidney damage from the dye or contrast used.  Blood clots that can lead to a stroke or heart attack. BEFORE THE PROCEDURE  Do not eat or drink after midnight on the night before the procedure, or as directed by your health care provider.   Ask your health care provider if you may drink enough water to take any needed medicines the morning of the procedure.  PROCEDURE  You may be given a medicine to help you relax (sedative) before and during the procedure. This medicine is given through an IV access tube that is inserted into one of your veins.   The area where the catheter will be inserted will be washed and shaved. This is usually done in the groin but may be done in the fold of your arm (near your elbow) or in the wrist.  A  medicine will be given to numb the area where the catheter will be inserted (local anesthetic).  The catheter will be inserted with a guide wire into an artery. The catheter is guided by using a type of X-ray (fluoroscopy) to the blood vessel being examined.   Dye is then injected into the catheter, and X-rays are taken. The dye helps to show where any narrowing or blockages are located.  AFTER THE PROCEDURE   If the procedure is done through the leg, you will be kept in bed lying flat for several hours. You will be instructed to not bend or cross your legs.  The insertion site will be checked frequently.  The pulse in your feet or wrist will be checked frequently.  Additional blood tests, X-rays, and electrocardiography may be done.   You may need to stay in the hospital overnight for observation.  Document Released: 05/02/2005 Document Revised: 07/28/2013 Document Reviewed: 12/24/2012 [facility] Patient Information 2015 [place], [place]. This information is not intended to replace advice given to you by your health care provider. Make sure you discuss any questions you have with your health care provider. Angiogram An angiogram, also called angiography, is a procedure used to look at the blood vessels that carry blood to different parts of your body (arteries). In this procedure, dye is injected through a long, thin tube (catheter) into an artery. X-rays are then taken. The X-rays will show if there is a blockage or problem in a blood vessel.  LET [facility] CARE PROVIDER KNOW ABOUT:  Any allergies you have, including allergies to shellfish or contrast dye.   All medicines you are taking, including vitamins, herbs, eye drops, creams, and over-the-counter medicines.   Previous problems you or members of your family have had with the use of anesthetics.   Any blood disorders you have.   Previous surgeries you have had.  Any previous kidney problems or failure you have  had.  Medical conditions you have.   Possibility of pregnancy, if this applies. RISKS AND COMPLICATIONS Generally, an angiogram is a safe procedure. However, as with any procedure, problems can occur. Possible problems include:  Injury to the blood vessels, including rupture or bleeding.  Infection or bruising at the catheter site.  Allergic reaction to the dye or contrast used.  Kidney damage from the dye or contrast used.  Blood clots that can lead to a stroke or heart attack. BEFORE THE PROCEDURE  Do not eat or drink after midnight on the night before the procedure, or as directed by your health care provider.   Ask your health care provider if you may drink enough water to take any needed medicines the morning of the procedure.  PROCEDURE  You may be given a medicine to help you relax (sedative) before and during the procedure. This medicine is given through an IV access tube that is inserted into one of your veins.   The area where the catheter will be inserted will be washed and shaved. This is usually done in the groin but may be done in the fold of your arm (near your elbow) or in the wrist.  A medicine will be given to numb the area where the catheter will be inserted (local anesthetic).  The catheter will be inserted with a guide wire into an artery. The catheter is guided by using a type of X-ray (fluoroscopy) to the blood vessel being examined.   Dye is then injected into the catheter, and X-rays are taken. The dye helps to show where any narrowing or blockages are located.  AFTER THE PROCEDURE   If the procedure is done through the leg, you will be kept in bed lying flat for several hours. You will be instructed to not bend or cross your legs.  The insertion site will be checked frequently.  The pulse in your feet or wrist will be checked frequently.  Additional blood tests, X-rays, and electrocardiography may be done.   You may need to stay in the  hospital overnight for observation.  Document Released: 05/02/2005 Document Revised: 07/28/2013 Document Reviewed: 12/24/2012 [facility] Patient Information 2015 [place], [place]. This information is not intended to replace advice given to you by your health care provider. Make sure you discuss any questions you have with your health care provider.

## 2015-01-19 MED FILL — Heparin Sodium (Porcine) 2 Unit/ML in Sodium Chloride 0.9%: INTRAMUSCULAR | Qty: 1000 | Status: AC

## 2015-02-17 ENCOUNTER — Other Ambulatory Visit: Payer: Self-pay | Admitting: Cardiology

## 2015-02-22 ENCOUNTER — Ambulatory Visit (INDEPENDENT_AMBULATORY_CARE_PROVIDER_SITE_OTHER): Payer: BLUE CROSS/BLUE SHIELD | Admitting: *Deleted

## 2015-02-22 DIAGNOSIS — I5022 Chronic systolic (congestive) heart failure: Secondary | ICD-10-CM

## 2015-02-22 DIAGNOSIS — I255 Ischemic cardiomyopathy: Secondary | ICD-10-CM | POA: Diagnosis not present

## 2015-02-22 NOTE — Progress Notes (Signed)
Remote ICD transmission.   

## 2015-03-07 ENCOUNTER — Other Ambulatory Visit: Payer: Self-pay | Admitting: Family Medicine

## 2015-03-08 NOTE — Telephone Encounter (Signed)
Refill both for one year. 

## 2015-03-08 NOTE — Telephone Encounter (Signed)
Can we refill these? 

## 2015-03-09 LAB — CUP PACEART REMOTE DEVICE CHECK
Battery Voltage: 3.11 V
Brady Statistic RV Percent Paced: 0 %
Date Time Interrogation Session: 20160719073627
HIGH POWER IMPEDANCE MEASURED VALUE: 266 Ohm
HIGH POWER IMPEDANCE MEASURED VALUE: 33 Ohm
HighPow Impedance: 39 Ohm
Lead Channel Impedance Value: 323 Ohm
Lead Channel Sensing Intrinsic Amplitude: 10.875 mV
Lead Channel Setting Pacing Pulse Width: 0.4 ms
MDC IDC MSMT LEADCHNL RV PACING THRESHOLD AMPLITUDE: 0.875 V
MDC IDC MSMT LEADCHNL RV PACING THRESHOLD PULSEWIDTH: 0.4 ms
MDC IDC MSMT LEADCHNL RV SENSING INTR AMPL: 10.875 mV
MDC IDC SET LEADCHNL RV PACING AMPLITUDE: 2.5 V
MDC IDC SET LEADCHNL RV SENSING SENSITIVITY: 0.3 mV
MDC IDC SET ZONE DETECTION INTERVAL: 250 ms
Zone Setting Detection Interval: 300 ms
Zone Setting Detection Interval: 370 ms

## 2015-03-17 ENCOUNTER — Other Ambulatory Visit: Payer: Self-pay | Admitting: Internal Medicine

## 2015-03-17 MED ORDER — ISOSORB DINITRATE-HYDRALAZINE 20-37.5 MG PO TABS: 2.0000 | ORAL_TABLET | Freq: Three times a day (TID) | ORAL | Status: DC

## 2015-04-07 ENCOUNTER — Other Ambulatory Visit: Payer: Self-pay | Admitting: Family Medicine

## 2015-04-12 ENCOUNTER — Other Ambulatory Visit: Payer: Self-pay | Admitting: Family Medicine

## 2015-04-15 ENCOUNTER — Encounter: Payer: Self-pay | Admitting: Cardiology

## 2015-04-25 ENCOUNTER — Encounter: Payer: Self-pay | Admitting: Internal Medicine

## 2015-05-06 ENCOUNTER — Other Ambulatory Visit: Payer: Self-pay | Admitting: Family Medicine

## 2015-05-09 NOTE — Telephone Encounter (Signed)
Has this been discontinued?

## 2015-05-10 ENCOUNTER — Telehealth: Payer: Self-pay

## 2015-05-10 NOTE — Telephone Encounter (Signed)
Phone call from [facility] pharmacist with drug interaction. Apparently Dr. Sarajane Jews prescribed Lisinopril 5mg  bid, and patient is on Entresto. Per Dr. Acie Fredrickson, DOD, discontinue Lisinopril. Pharmacist notified.

## 2015-05-18 ENCOUNTER — Other Ambulatory Visit: Payer: Self-pay | Admitting: Cardiology

## 2015-05-18 ENCOUNTER — Other Ambulatory Visit: Payer: Self-pay | Admitting: Internal Medicine

## 2015-05-30 ENCOUNTER — Ambulatory Visit (INDEPENDENT_AMBULATORY_CARE_PROVIDER_SITE_OTHER): Payer: BLUE CROSS/BLUE SHIELD | Admitting: *Deleted

## 2015-05-30 DIAGNOSIS — I255 Ischemic cardiomyopathy: Secondary | ICD-10-CM | POA: Diagnosis not present

## 2015-05-30 DIAGNOSIS — I5022 Chronic systolic (congestive) heart failure: Secondary | ICD-10-CM | POA: Diagnosis not present

## 2015-05-30 NOTE — Progress Notes (Signed)
Remote ICD transmission.   

## 2015-05-31 LAB — CUP PACEART REMOTE DEVICE CHECK
Battery Voltage: 3.1 V
Date Time Interrogation Session: 20161024094352
HighPow Impedance: 304 Ohm
HighPow Impedance: 36 Ohm
HighPow Impedance: 44 Ohm
Implantable Lead Implant Date: 20130603
Implantable Lead Model: 6947
Lead Channel Impedance Value: 342 Ohm
Lead Channel Pacing Threshold Amplitude: 0.75 V
Lead Channel Pacing Threshold Pulse Width: 0.4 ms
Lead Channel Sensing Intrinsic Amplitude: 12 mV
Lead Channel Setting Pacing Amplitude: 2.5 V
Lead Channel Setting Sensing Sensitivity: 0.3 mV
MDC IDC LEAD LOCATION: 753860
MDC IDC MSMT LEADCHNL RV SENSING INTR AMPL: 12 mV
MDC IDC SET LEADCHNL RV PACING PULSEWIDTH: 0.4 ms
MDC IDC STAT BRADY RV PERCENT PACED: 0.01 %

## 2015-06-01 ENCOUNTER — Encounter: Payer: Self-pay | Admitting: Cardiology

## 2015-06-17 ENCOUNTER — Other Ambulatory Visit: Payer: Self-pay | Admitting: *Deleted

## 2015-06-17 MED ORDER — INSULIN ASPART PROT & ASPART (70-30 MIX) 100 UNIT/ML PEN: 5.0000 [IU] | PEN_INJECTOR | Freq: Three times a day (TID) | SUBCUTANEOUS | Status: DC

## 2015-09-14 ENCOUNTER — Other Ambulatory Visit: Payer: Self-pay | Admitting: Family Medicine

## 2015-09-14 ENCOUNTER — Other Ambulatory Visit: Payer: Self-pay | Admitting: Cardiology

## 2015-09-14 ENCOUNTER — Other Ambulatory Visit (HOSPITAL_COMMUNITY): Payer: Self-pay | Admitting: *Deleted

## 2015-09-14 MED ORDER — ISOSORB DINITRATE-HYDRALAZINE 20-37.5 MG PO TABS: 2.0000 | ORAL_TABLET | Freq: Three times a day (TID) | ORAL | Status: DC

## 2015-09-15 ENCOUNTER — Encounter: Payer: Self-pay | Admitting: Internal Medicine

## 2015-09-15 ENCOUNTER — Ambulatory Visit (INDEPENDENT_AMBULATORY_CARE_PROVIDER_SITE_OTHER): Payer: BLUE CROSS/BLUE SHIELD | Admitting: Internal Medicine

## 2015-09-15 VITALS — BP 122/76 | HR 80 | Ht 70.5 in | Wt 227.6 lb

## 2015-09-15 DIAGNOSIS — I255 Ischemic cardiomyopathy: Secondary | ICD-10-CM | POA: Diagnosis not present

## 2015-09-15 DIAGNOSIS — Z79899 Other long term (current) drug therapy: Secondary | ICD-10-CM

## 2015-09-15 DIAGNOSIS — I5022 Chronic systolic (congestive) heart failure: Secondary | ICD-10-CM

## 2015-09-15 DIAGNOSIS — Z95810 Presence of automatic (implantable) cardiac defibrillator: Secondary | ICD-10-CM

## 2015-09-15 LAB — BASIC METABOLIC PANEL
BUN: 22 mg/dL (ref 7–25)
CHLORIDE: 105 mmol/L (ref 98–110)
CO2: 16 mmol/L — AB (ref 20–31)
CREATININE: 1.13 mg/dL (ref 0.70–1.33)
Calcium: 9.5 mg/dL (ref 8.6–10.3)
Glucose, Bld: 88 mg/dL (ref 65–99)
Potassium: 3.8 mmol/L (ref 3.5–5.3)
Sodium: 138 mmol/L (ref 135–146)

## 2015-09-15 LAB — CUP PACEART INCLINIC DEVICE CHECK
Date Time Interrogation Session: 20170209164014
HIGH POWER IMPEDANCE MEASURED VALUE: 33 Ohm
HighPow Impedance: 266 Ohm
HighPow Impedance: 42 Ohm
Implantable Lead Location: 753860
Lead Channel Impedance Value: 323 Ohm
Lead Channel Pacing Threshold Pulse Width: 0.4 ms
Lead Channel Sensing Intrinsic Amplitude: 10.375 mV
Lead Channel Setting Sensing Sensitivity: 0.3 mV
MDC IDC LEAD IMPLANT DT: 20130603
MDC IDC MSMT BATTERY VOLTAGE: 3.09 V
MDC IDC MSMT LEADCHNL RV PACING THRESHOLD AMPLITUDE: 1 V
MDC IDC SET LEADCHNL RV PACING AMPLITUDE: 2.5 V
MDC IDC SET LEADCHNL RV PACING PULSEWIDTH: 0.4 ms
MDC IDC STAT BRADY RV PERCENT PACED: 0 %

## 2015-09-15 LAB — MAGNESIUM: Magnesium: 2.1 mg/dL (ref 1.5–2.5)
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
## 2015-09-15 NOTE — Patient Instructions (Signed)
Medication Instructions:  Your physician recommends that you continue on your current medications as directed. Please refer to the Current Medication list given to you today.  Labwork: Today: BMET & Magnesium   Testing/Procedures: None ordered  Follow-Up: Your physician recommends that you schedule a follow-up appointment in: 3 months with Dr. Aundra Dubin in the heart failure clinic  Remote monitoring is used to monitor your Pacemaker of ICD from home. This monitoring reduces the number of office visits required to check your device to one time per year. It allows [place] to keep an eye on the functioning of your device to ensure it is working properly. You are scheduled for a device check from home on 12/15/2015. You may send your transmission at any time that day. If you have a wireless device, the transmission will be sent automatically. After your physician reviews your transmission, you will receive a postcard with your next transmission date.  Your physician wants you to follow-up in: 1 year with Dr. Caryl Comes. You will receive a reminder letter in the mail two months in advance. If you don't receive a letter, please call our office to schedule the follow-up appointment.  If you need a refill on your cardiac medications before your next appointment, please call your pharmacy.  Thank you for choosing [facility]!!

## 2015-09-26 ENCOUNTER — Telehealth: Payer: Self-pay | Admitting: Family Medicine

## 2015-09-26 ENCOUNTER — Telehealth: Payer: Self-pay | Admitting: Internal Medicine

## 2015-09-26 ENCOUNTER — Other Ambulatory Visit: Payer: Self-pay | Admitting: *Deleted

## 2015-09-26 DIAGNOSIS — I255 Ischemic cardiomyopathy: Secondary | ICD-10-CM

## 2015-09-26 NOTE — Telephone Encounter (Signed)
I spoke with the patient. 

## 2015-09-26 NOTE — Telephone Encounter (Signed)
Pt's CO2 levels were abnormal when last check.  Dr. Jens Som would like patient to repeat labs.  Pt has appt with PCP tomorrow and has requested to have labs drawn at [facility] during office visit.  Nira Conn will enter order in system.

## 2015-09-26 NOTE — Telephone Encounter (Signed)
Pt returning call from [place] test results.

## 2015-09-27 ENCOUNTER — Encounter: Payer: Self-pay | Admitting: Family Medicine

## 2015-09-27 ENCOUNTER — Ambulatory Visit (INDEPENDENT_AMBULATORY_CARE_PROVIDER_SITE_OTHER): Payer: BLUE CROSS/BLUE SHIELD | Admitting: Family Medicine

## 2015-09-27 VITALS — BP 128/72 | HR 90 | Temp 98.9°F | Ht 70.5 in | Wt 222.0 lb

## 2015-09-27 DIAGNOSIS — E1122 Type 2 diabetes mellitus with diabetic chronic kidney disease: Secondary | ICD-10-CM

## 2015-09-27 DIAGNOSIS — Z794 Long term (current) use of insulin: Secondary | ICD-10-CM | POA: Diagnosis not present

## 2015-09-27 DIAGNOSIS — M25572 Pain in left ankle and joints of left foot: Secondary | ICD-10-CM

## 2015-09-27 DIAGNOSIS — I5021 Acute systolic (congestive) heart failure: Secondary | ICD-10-CM | POA: Diagnosis not present

## 2015-09-27 DIAGNOSIS — I5022 Chronic systolic (congestive) heart failure: Secondary | ICD-10-CM | POA: Diagnosis not present

## 2015-09-27 DIAGNOSIS — N183 Chronic kidney disease, stage 3 unspecified: Secondary | ICD-10-CM

## 2015-09-27 DIAGNOSIS — I10 Essential (primary) hypertension: Secondary | ICD-10-CM

## 2015-09-27 LAB — BASIC METABOLIC PANEL
BUN: 28 mg/dL — AB (ref 6–23)
CO2: 27 meq/L (ref 19–32)
Calcium: 10.1 mg/dL (ref 8.4–10.5)
Chloride: 101 mEq/L (ref 96–112)
Creatinine, Ser: 1.25 mg/dL (ref 0.40–1.50)
GFR: 77.16 mL/min (ref >60.00)
GLUCOSE: 159 mg/dL — AB (ref 70–99)
Potassium: 4.6 mEq/L (ref 3.5–5.1)
SODIUM: 141 meq/L (ref 135–145)

## 2015-09-27 LAB — CBC WITH DIFFERENTIAL/PLATELET
BASOS ABS: 0 10*3/uL (ref 0.0–0.1)
Basophils Relative: 0.3 % (ref 0.0–3.0)
Eosinophils Absolute: 0.1 10*3/uL (ref 0.0–0.7)
Eosinophils Relative: 1.1 % (ref 0.0–5.0)
HCT: 40.8 % (ref 39.0–52.0)
Hemoglobin: 13.3 g/dL (ref 13.0–17.0)
LYMPHS ABS: 2.5 10*3/uL (ref 0.7–4.0)
Lymphocytes Relative: 29.7 % (ref 12.0–46.0)
MCHC: 32.5 g/dL (ref 30.0–36.0)
MCV: 81.9 fl (ref 78.0–100.0)
MONO ABS: 0.7 10*3/uL (ref 0.1–1.0)
Monocytes Relative: 8.3 % (ref 3.0–12.0)
NEUTROS PCT: 60.6 % (ref 43.0–77.0)
Neutro Abs: 5 10*3/uL (ref 1.4–7.7)
Platelets: 313 10*3/uL (ref 150.0–400.0)
RBC: 4.99 Mil/uL (ref 4.22–5.81)
RDW: 14.6 % (ref 11.5–15.5)
WBC: 8.3 10*3/uL (ref 4.0–10.5)

## 2015-09-27 LAB — TSH: TSH: 1.8 u[IU]/mL (ref 0.35–4.50)

## 2015-09-27 LAB — HEMOGLOBIN A1C: HEMOGLOBIN A1C: 10.6 % — AB (ref 4.6–6.5)

## 2015-09-27 LAB — HEPATIC FUNCTION PANEL
ALBUMIN: 4 g/dL (ref 3.5–5.2)
ALK PHOS: 91 U/L (ref 39–117)
ALT: 15 U/L (ref 0–53)
AST: 21 U/L (ref 0–37)
Bilirubin, Direct: 0.1 mg/dL (ref 0.0–0.3)
TOTAL PROTEIN: 8 g/dL (ref 6.0–8.3)
Total Bilirubin: 0.6 mg/dL (ref 0.2–1.2)

## 2015-09-27 LAB — URIC ACID: Uric Acid, Serum: 11.6 mg/dL — ABNORMAL HIGH (ref 4.0–7.8)

## 2015-09-27 MED ORDER — INSULIN ASPART PROT & ASPART (70-30 MIX) 100 UNIT/ML PEN: 5.0000 [IU] | PEN_INJECTOR | Freq: Three times a day (TID) | SUBCUTANEOUS | Status: DC

## 2015-09-27 MED ORDER — INSULIN PEN NEEDLE 32G X 4 MM MISC: Status: DC

## 2015-09-27 NOTE — Progress Notes (Signed)
   Subjective:    Patient ID: Marcus Johnson, male    DOB: 09/08/1960, 55 y.o.   MRN: [id number]  HPI Here to follow up. His diabetes has been fairly stable with fasting glucoses in the low to mid 100s. He denies any chest pain or SOB. He recently saw Dr. Caryl Comes and his ICD is working properly. He has been stable form a cardiac standpoint. In Decemeber his EF was shown to be 20-25%. He does mention the sudden onset of swelling and pain in the left ankle a few days ago. No recent trauma. He woke up this way one morning. It has improved ever since and now does not bother hm much.    Review of Systems  Constitutional: Negative.   Respiratory: Negative.   Cardiovascular: Negative.   Musculoskeletal: Positive for joint swelling and arthralgias.  Neurological: Negative.        Objective:   Physical Exam  Constitutional: He is oriented to person, place, and time. He appears well-developed and well-nourished.  Neck: No thyromegaly present.  Cardiovascular: Normal rate, regular rhythm, normal heart sounds and intact distal pulses.   Pulmonary/Chest: Effort normal and breath sounds normal.  Musculoskeletal: He exhibits no edema.  The left ankle is normal on exam  Lymphadenopathy:    He has no cervical adenopathy.  Neurological: He is alert and oriented to person, place, and time.          Assessment & Plan:  His CHF and HTN are stable. We will get labs today including an A1c to check his diabetic state. He may have had a recent episode of gout but it is hard to say. We will check a uric acid level. He also had a recent BMET which was normal except for a low CO2 of 16. This was likely a spurious result but we will recheck today

## 2015-09-27 NOTE — Addendum Note (Signed)
Addended by: Alysia Penna A on: 09/27/2015 10:12 PM   Modules accepted: Orders

## 2015-10-03 MED ORDER — ALLOPURINOL 300 MG PO TABS: 300.0000 mg | ORAL_TABLET | Freq: Every day | ORAL | Status: DC

## 2015-10-03 NOTE — Addendum Note (Signed)
Addended by: Aggie Hacker A on: 10/03/2015 03:14 PM   Modules accepted: Orders

## 2015-10-21 ENCOUNTER — Emergency Department (HOSPITAL_COMMUNITY): Payer: BLUE CROSS/BLUE SHIELD

## 2015-10-21 ENCOUNTER — Emergency Department (HOSPITAL_COMMUNITY)
Admission: EM | Admit: 2015-10-21 | Discharge: 2015-10-21 | Disposition: A | Discharge status: Home / Self Care | Payer: BLUE CROSS/BLUE SHIELD | Attending: Emergency Medicine | Admitting: Emergency Medicine

## 2015-10-21 ENCOUNTER — Encounter (HOSPITAL_COMMUNITY): Payer: Self-pay

## 2015-10-21 DIAGNOSIS — E119 Type 2 diabetes mellitus without complications: Secondary | ICD-10-CM | POA: Insufficient documentation

## 2015-10-21 DIAGNOSIS — Z9889 Other specified postprocedural states: Secondary | ICD-10-CM | POA: Insufficient documentation

## 2015-10-21 DIAGNOSIS — Z7982 Long term (current) use of aspirin: Secondary | ICD-10-CM | POA: Insufficient documentation

## 2015-10-21 DIAGNOSIS — Z9119 Patient's noncompliance with other medical treatment and regimen: Secondary | ICD-10-CM | POA: Diagnosis not present

## 2015-10-21 DIAGNOSIS — I5043 Acute on chronic combined systolic (congestive) and diastolic (congestive) heart failure: Secondary | ICD-10-CM | POA: Diagnosis not present

## 2015-10-21 DIAGNOSIS — Z794 Long term (current) use of insulin: Secondary | ICD-10-CM | POA: Insufficient documentation

## 2015-10-21 DIAGNOSIS — I10 Essential (primary) hypertension: Secondary | ICD-10-CM | POA: Insufficient documentation

## 2015-10-21 DIAGNOSIS — R011 Cardiac murmur, unspecified: Secondary | ICD-10-CM | POA: Insufficient documentation

## 2015-10-21 DIAGNOSIS — Z8669 Personal history of other diseases of the nervous system and sense organs: Secondary | ICD-10-CM | POA: Diagnosis not present

## 2015-10-21 DIAGNOSIS — R0602 Shortness of breath: Secondary | ICD-10-CM | POA: Diagnosis present

## 2015-10-21 DIAGNOSIS — Z8614 Personal history of Methicillin resistant Staphylococcus aureus infection: Secondary | ICD-10-CM | POA: Insufficient documentation

## 2015-10-21 DIAGNOSIS — Z79899 Other long term (current) drug therapy: Secondary | ICD-10-CM | POA: Insufficient documentation

## 2015-10-21 DIAGNOSIS — Z950 Presence of cardiac pacemaker: Secondary | ICD-10-CM | POA: Diagnosis not present

## 2015-10-21 DIAGNOSIS — Z951 Presence of aortocoronary bypass graft: Secondary | ICD-10-CM | POA: Diagnosis not present

## 2015-10-21 DIAGNOSIS — I2510 Atherosclerotic heart disease of native coronary artery without angina pectoris: Secondary | ICD-10-CM | POA: Diagnosis not present

## 2015-10-21 LAB — BRAIN NATRIURETIC PEPTIDE: B Natriuretic Peptide: 1596.8 pg/mL — ABNORMAL HIGH (ref 0.0–100.0)

## 2015-10-21 LAB — BASIC METABOLIC PANEL WITH GFR
Anion gap: 13 (ref 5–15)
BUN: 29 mg/dL — ABNORMAL HIGH (ref 6–20)
CO2: 25 mmol/L (ref 22–32)
Calcium: 9.3 mg/dL (ref 8.9–10.3)
Chloride: 105 mmol/L (ref 101–111)
Creatinine, Ser: 1.22 mg/dL (ref 0.61–1.24)
GFR calc Af Amer: >60 mL/min (ref >60)
GFR calc non Af Amer: >60 mL/min (ref >60)
Glucose, Bld: 108 mg/dL — ABNORMAL HIGH (ref 65–99)
Potassium: 3.8 mmol/L (ref 3.5–5.1)
Sodium: 143 mmol/L (ref 135–145)

## 2015-10-21 LAB — MAGNESIUM: Magnesium: 2 mg/dL (ref 1.7–2.4)

## 2015-10-21 LAB — CBC
HEMATOCRIT: 35.3 % — AB (ref 39.0–52.0)
HEMOGLOBIN: 11.1 g/dL — AB (ref 13.0–17.0)
MCH: 25.7 pg — AB (ref 26.0–34.0)
MCHC: 31.4 g/dL (ref 30.0–36.0)
MCV: 81.7 fL (ref 78.0–100.0)
Platelets: 137 10*3/uL — ABNORMAL LOW (ref 150–400)
RBC: 4.32 MIL/uL (ref 4.22–5.81)
RDW: 15 % (ref 11.5–15.5)
WBC: 9 10*3/uL (ref 4.0–10.5)

## 2015-10-21 LAB — I-STAT TROPONIN, ED: Troponin i, poc: 0.55 ng/mL (ref 0.00–0.08)

## 2015-10-21 LAB — PROTIME-INR
INR: 1.37 (ref 0.00–1.49)
Prothrombin Time: 17 s — ABNORMAL HIGH (ref 11.6–15.2)

## 2015-10-21 LAB — CBG MONITORING, ED: Glucose-Capillary: 107 mg/dL — ABNORMAL HIGH (ref 65–99)

## 2015-10-21 MED ORDER — HEPARIN (PORCINE) IN NACL 100-0.45 UNIT/ML-% IJ SOLN
1350.0000 [IU]/h | INTRAMUSCULAR | Status: DC
  Filled 2015-10-21: qty 250

## 2015-10-21 MED ORDER — FUROSEMIDE 10 MG/ML IJ SOLN
40.0000 mg | INTRAMUSCULAR | Status: AC
  Administered 2015-10-21: 40 mg via INTRAVENOUS
  Filled 2015-10-21: qty 4

## 2015-10-21 MED ORDER — HEPARIN BOLUS VIA INFUSION
4000.0000 [IU] | Freq: Once | INTRAVENOUS | Status: DC
  Filled 2015-10-21: qty 4000

## 2015-10-21 MED ORDER — ASPIRIN 81 MG PO CHEW
324.0000 mg | CHEWABLE_TABLET | Freq: Once | ORAL | Status: AC
  Administered 2015-10-21: 324 mg via ORAL
  Filled 2015-10-21: qty 4

## 2015-10-21 NOTE — ED Notes (Signed)
Pt. Having sob and choking episodes while he is sleeping.  He has a hx of CHF and feels his lasix is not working as well.   Pt. Denies any chest pain, but believes that his abdomen is swollen.  Skin is warm and dry. Alert and oriented X4.  Pt. Also reports having vomited 1.
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
## 2015-10-21 NOTE — ED Notes (Signed)
Pt reports SOB, and cough. History of CHF, currently taking Lasix at home. Respirations unlabored at the time. Lung sounds clear and equal bilaterally. Also states abdominal distention, states "my stomach feels bloated."

## 2015-10-21 NOTE — Progress Notes (Signed)
ANTICOAGULATION CONSULT NOTE - Initial Consult  Pharmacy Consult for Heparin Indication: chest pain/ACS  No Known Allergies  Patient Measurements: Height: 5' 10.5" (179.1 cm) Weight: 234 lb (106.142 kg) IBW/kg (Calculated) : 74.15 Heparin Dosing Weight: 97 kg  Vital Signs: Temp: 98.7 F (37.1 C) (03/17 1618) Temp Source: Oral (03/17 1618) BP: 153/89 mmHg (03/17 1618) Pulse Rate: 92 (03/17 1618)  Labs:  Recent Labs  10/21/15 1639  HGB 11.1*  HCT 35.3*  PLT 137*  CREATININE 1.22    Estimated Creatinine Clearance: 85.2 mL/min (by C-G formula based on Cr of 1.22).   Medical History: Past Medical History  Diagnosis Date  . Hypertension   . Coronary artery disease     a. s/p CABG 2002 b. LHC 5/13:  dLM 80%, LAD subtotally occluded, pCFX occluded, pRCA 50%, mid? Occlusion with high take off of the PDA with 70% multiple lesions-not bypassed and supplies collaterals to LAD, LIMA-IM/ramus ok, S-OM ok, S-PLA branches ok. Medical therapy was recommended  . Chronic systolic heart failure ([place])     a. Echo 5/13: Mild LVE, mild LVH, EF 10%, anteroseptal, lateral, apical AK, mild MR, mild LAE, moderately reduced RVSF, mild RAE, PASP 60;  b. 07/2014 Echo: EF 20-2%, diff HK, AKI of antsep/apical/mid-apicalinferior, mod reduced RV.  . Diabetes mellitus     type 2 requiring insulin   . MRSA (methicillin resistant Staphylococcus aureus)     Status post right foot plantar deep infection with MRSA status post  I&D 10/2008  . Noncompliance   . Peripheral neuropathy ([place])   . Ischemic cardiomyopathy     a. Echo 5/13: Mild LVE, mild LVH, EF 10%, anteroseptal, lateral, apical AK, mild MR, mild LAE, moderately reduced RVSF, mild RAE, PASP 60;  b. 01/2012 s/p MDT [id number] Protecta XT VR AICD;  c. 07/2014 Echo: EF 20-2%, diff HK, AKI of antsep/apical/mid-apicalinferior, mod reduced RV.  Marland Kitchen Single-chamber  implantable cardiac defibrillator - [employer]   . Syncope     Medications:   (Not in a  hospital admission) Scheduled:  . aspirin  324 mg Oral Once   Infusions:    Assessment: 55yo male with history of CAD, CHF, DM2 and noncompliance presents with SOB. Pharmacy is consulted to dose heparin for ACS/chest pain.   Goal of Therapy:  Heparin level 0.3-0.7 units/ml Monitor platelets by anticoagulation protocol: Yes   Plan:  Give 4000 units bolus x 1 Start heparin infusion at 1350 units/hr Check anti-Xa level in 6 hours and daily while on heparin Continue to monitor H&H and platelets  Andrey Cota. Diona Foley, PharmD, [place] [facility] [facility] Clinical Pharmacist Pager [phone] 10/21/2015,6:04 PM

## 2015-10-21 NOTE — Discharge Instructions (Signed)
Titrate Lasix dosing according to weights and symptoms Take 4 tablets by mouth (160 mg total) every morning and 3 tablets by mouth (120 mg) every evening while having SOB and elevated weight.  Continue this adjusted dosing until at dry weight, then resume previous dosing.  Please see cardiology as soon as you can early next week for follow up.   Heart Failure Heart failure is a condition in which the heart has trouble pumping blood. This means your heart does not pump blood efficiently for your body to work well. In some cases of heart failure, fluid may back up into your lungs or you may have swelling (edema) in your lower legs. Heart failure is usually a long-term (chronic) condition. It is important for you to take good care of yourself and follow your health care provider's treatment plan. CAUSES  Some health conditions can cause heart failure. Those health conditions include:  High blood pressure (hypertension). Hypertension causes the heart muscle to work harder than normal. When pressure in the blood vessels is high, the heart needs to pump (contract) with more force in order to circulate blood throughout the body. High blood pressure eventually causes the heart to become stiff and weak.  Coronary artery disease (CAD). CAD is the buildup of cholesterol and fat (plaque) in the arteries of the heart. The blockage in the arteries deprives the heart muscle of oxygen and blood. This can cause chest pain and may lead to a heart attack. High blood pressure can also contribute to CAD.  Heart attack (myocardial infarction). A heart attack occurs when one or more arteries in the heart become blocked. The loss of oxygen damages the muscle tissue of the heart. When this happens, part of the heart muscle dies. The injured tissue does not contract as well and weakens the heart's ability to pump blood.  Abnormal heart valves. When the heart valves do not open and close properly, it can cause heart failure.  This makes the heart muscle pump harder to keep the blood flowing.  Heart muscle disease (cardiomyopathy or myocarditis). Heart muscle disease is damage to the heart muscle from a variety of causes. These can include drug or alcohol abuse, infections, or unknown reasons. These can increase the risk of heart failure.  Lung disease. Lung disease makes the heart work harder because the lungs do not work properly. This can cause a strain on the heart, leading it to fail.  Diabetes. Diabetes increases the risk of heart failure. High blood sugar contributes to high fat (lipid) levels in the blood. Diabetes can also cause slow damage to tiny blood vessels that carry important nutrients to the heart muscle. When the heart does not get enough oxygen and food, it can cause the heart to become weak and stiff. This leads to a heart that does not contract efficiently.  Other conditions can contribute to heart failure. These include abnormal heart rhythms, thyroid problems, and low blood counts (anemia). Certain unhealthy behaviors can increase the risk of heart failure, including:  Being overweight.  Smoking or chewing tobacco.  Eating foods high in fat and cholesterol.  Abusing illicit drugs or alcohol.  Lacking physical activity. SYMPTOMS  Heart failure symptoms may vary and can be hard to detect. Symptoms may include:  Shortness of breath with activity, such as climbing stairs.  Persistent cough.  Swelling of the feet, ankles, legs, or abdomen.  Unexplained weight gain.  Difficulty breathing when lying flat (orthopnea).  Waking from sleep because of the need  to sit up and get more air.  Rapid heartbeat.  Fatigue and loss of energy.  Feeling light-headed, dizzy, or close to fainting.  Loss of appetite.  Nausea.  Increased urination during the night (nocturia). DIAGNOSIS  A diagnosis of heart failure is based on your history, symptoms, physical examination, and diagnostic tests.  Diagnostic tests for heart failure may include:  Echocardiography.  Electrocardiography.  Chest X-ray.  Blood tests.  Exercise stress test.  Cardiac angiography.  Radionuclide scans. TREATMENT  Treatment is aimed at managing the symptoms of heart failure. Medicines, behavioral changes, or surgical intervention may be necessary to treat heart failure.  Medicines to help treat heart failure may include:  Angiotensin-converting enzyme (ACE) inhibitors. This type of medicine blocks the effects of a blood protein called angiotensin-converting enzyme. ACE inhibitors relax (dilate) the blood vessels and help lower blood pressure.  Angiotensin receptor blockers (ARBs). This type of medicine blocks the actions of a blood protein called angiotensin. Angiotensin receptor blockers dilate the blood vessels and help lower blood pressure.  Water pills (diuretics). Diuretics cause the kidneys to remove salt and water from the blood. The extra fluid is removed through urination. This loss of extra fluid lowers the volume of blood the heart pumps.  Beta blockers. These prevent the heart from beating too fast and improve heart muscle strength.  Digitalis. This increases the force of the heartbeat.  Healthy behavior changes include:  Obtaining and maintaining a healthy weight.  Stopping smoking or chewing tobacco.  Eating heart-healthy foods.  Limiting or avoiding alcohol.  Stopping illicit drug use.  Physical activity as directed by your health care provider.  Surgical treatment for heart failure may include:  A procedure to open blocked arteries, repair damaged heart valves, or remove damaged heart muscle tissue.  A pacemaker to improve heart muscle function and control certain abnormal heart rhythms.  An internal cardioverter defibrillator to treat certain serious abnormal heart rhythms.  A left ventricular assist device (LVAD) to assist the pumping ability of the heart. HOME CARE  INSTRUCTIONS   Take medicines only as directed by your health care provider. Medicines are important in reducing the workload of your heart, slowing the progression of heart failure, and improving your symptoms.  Do not stop taking your medicine unless directed by your health care provider.  Do not skip any dose of medicine.  Refill your prescriptions before you run out of medicine. Your medicines are needed every day.  Engage in moderate physical activity if directed by your health care provider. Moderate physical activity can benefit some people. The elderly and people with severe heart failure should consult with a health care provider for physical activity recommendations.  Eat heart-healthy foods. Food choices should be free of trans fat and low in saturated fat, cholesterol, and salt (sodium). Healthy choices include fresh or frozen fruits and vegetables, fish, lean meats, legumes, fat-free or low-fat dairy products, and whole grain or high fiber foods. Talk to a dietitian to learn more about heart-healthy foods.  Limit sodium if directed by your health care provider. Sodium restriction may reduce symptoms of heart failure in some people. Talk to a dietitian to learn more about heart-healthy seasonings.  Use healthy cooking methods. Healthy cooking methods include roasting, grilling, broiling, baking, poaching, steaming, or stir-frying. Talk to a dietitian to learn more about healthy cooking methods.  Limit fluids if directed by your health care provider. Fluid restriction may reduce symptoms of heart failure in some people.  Weigh yourself every  day. Daily weights are important in the early recognition of excess fluid. You should weigh yourself every morning after you urinate and before you eat breakfast. Wear the same amount of clothing each time you weigh yourself. Record your daily weight. Provide your health care provider with your weight record.  Monitor and record your blood  pressure if directed by your health care provider.  Check your pulse if directed by your health care provider.  Lose weight if directed by your health care provider. Weight loss may reduce symptoms of heart failure in some people.  Stop smoking or chewing tobacco. Nicotine makes your heart work harder by causing your blood vessels to constrict. Do not use nicotine gum or patches before talking to your health care provider.  Keep all follow-up visits as directed by your health care provider. This is important.  Limit alcohol intake to no more than 1 drink per day for nonpregnant women and 2 drinks per day for men. One drink equals 12 ounces of beer, 5 ounces of wine, or 1 ounces of hard liquor. Drinking more than that is harmful to your heart. Tell your health care provider if you drink alcohol several times a week. Talk with your health care provider about whether alcohol is safe for you. If your heart has already been damaged by alcohol or you have severe heart failure, drinking alcohol should be stopped completely.  Stop illicit drug use.  Stay up-to-date with immunizations. It is especially important to prevent respiratory infections through current pneumococcal and influenza immunizations.  Manage other health conditions such as hypertension, diabetes, thyroid disease, or abnormal heart rhythms as directed by your health care provider.  Learn to manage stress.  Plan rest periods when fatigued.  Learn strategies to manage high temperatures. If the weather is extremely hot:  Avoid vigorous physical activity.  Use air conditioning or fans or seek a cooler location.  Avoid caffeine and alcohol.  Wear loose-fitting, lightweight, and light-colored clothing.  Learn strategies to manage cold temperatures. If the weather is extremely cold:  Avoid vigorous physical activity.  Layer clothes.  Wear mittens or gloves, a hat, and a scarf when going outside.  Avoid alcohol.  Obtain  ongoing education and support as needed.  Participate in or seek rehabilitation as needed to maintain or improve independence and quality of life. SEEK MEDICAL CARE IF:   You have a rapid weight gain.  You have increasing shortness of breath that is unusual for you.  You are unable to participate in your usual physical activities.  You tire easily.  You cough more than normal, especially with physical activity.  You have any or more swelling in areas such as your hands, feet, ankles, or abdomen.  You are unable to sleep because it is hard to breathe.  You feel like your heart is beating fast (palpitations).  You become dizzy or light-headed upon standing up. SEEK IMMEDIATE MEDICAL CARE IF:   You have difficulty breathing.  There is a change in mental status such as decreased alertness or difficulty with concentration.  You have a pain or discomfort in your chest.  You have an episode of fainting (syncope). MAKE SURE YOU:   Understand these instructions.  Will watch your condition.  Will get help right away if you are not doing well or get worse.   This information is not intended to replace advice given to you by your health care provider. Make sure you discuss any questions you have with your health  care provider.   Document Released: 07/23/2005 Document Revised: 12/07/2014 Document Reviewed: 08/22/2012 Elsevier Interactive Patient Education 2016 [employer].  Shortness of Breath Shortness of breath means you have trouble breathing. It could also mean that you have a medical problem. You should get immediate medical care for shortness of breath. CAUSES   Not enough oxygen in the air such as with high altitudes or a smoke-filled room.  Certain lung diseases, infections, or problems.  Heart disease or conditions, such as angina or heart failure.  Low red blood cells (anemia).  Poor physical fitness, which can cause shortness of breath when you  exercise.  Chest or back injuries or stiffness.  Being overweight.  Smoking.  Anxiety, which can make you feel like you are not getting enough air. DIAGNOSIS  Serious medical problems can often be found during your physical exam. Tests may also be done to determine why you are having shortness of breath. Tests may include:  Chest X-rays.  Lung function tests.  Blood tests.  An electrocardiogram (ECG).  An ambulatory electrocardiogram. An ambulatory ECG records your heartbeat patterns over a 24-hour period.  Exercise testing.  A transthoracic echocardiogram (TTE). During echocardiography, sound waves are used to evaluate how blood flows through your heart.  A transesophageal echocardiogram (TEE).  Imaging scans. Your health care provider may not be able to find a cause for your shortness of breath after your exam. In this case, it is important to have a follow-up exam with your health care provider as directed.  TREATMENT  Treatment for shortness of breath depends on the cause of your symptoms and can vary greatly. HOME CARE INSTRUCTIONS   Do not smoke. Smoking is a common cause of shortness of breath. If you smoke, ask for help to quit.  Avoid being around chemicals or things that may bother your breathing, such as paint fumes and dust.  Rest as needed. Slowly resume your usual activities.  If medicines were prescribed, take them as directed for the full length of time directed. This includes oxygen and any inhaled medicines.  Keep all follow-up appointments as directed by your health care provider. SEEK MEDICAL CARE IF:   Your condition does not improve in the time expected.  You have a hard time doing your normal activities even with rest.  You have any new symptoms. SEEK IMMEDIATE MEDICAL CARE IF:   Your shortness of breath gets worse.  You feel light-headed, faint, or develop a cough not controlled with medicines.  You start coughing up blood.  You have  pain with breathing.  You have chest pain or pain in your arms, shoulders, or abdomen.  You have a fever.  You are unable to walk up stairs or exercise the way you normally do. MAKE SURE YOU:  Understand these instructions.  Will watch your condition.  Will get help right away if you are not doing well or get worse.   This information is not intended to replace advice given to you by your health care provider. Make sure you discuss any questions you have with your health care provider.   Document Released: 04/17/2001 Document Revised: 07/28/2013 Document Reviewed: 10/08/2011 Elsevier Interactive Patient Education [employer].

## 2015-10-27 ENCOUNTER — Encounter: Payer: Self-pay | Admitting: Endocrinology

## 2015-12-02 ENCOUNTER — Other Ambulatory Visit: Payer: Self-pay | Admitting: Cardiology

## 2015-12-12 ENCOUNTER — Encounter: Payer: Self-pay | Admitting: Cardiology

## 2015-12-12 ENCOUNTER — Ambulatory Visit (INDEPENDENT_AMBULATORY_CARE_PROVIDER_SITE_OTHER): Payer: BLUE CROSS/BLUE SHIELD | Admitting: Cardiology

## 2015-12-12 VITALS — BP 126/72 | HR 90 | Ht 70.5 in | Wt 225.8 lb

## 2015-12-12 DIAGNOSIS — I2510 Atherosclerotic heart disease of native coronary artery without angina pectoris: Secondary | ICD-10-CM

## 2015-12-12 DIAGNOSIS — I5022 Chronic systolic (congestive) heart failure: Secondary | ICD-10-CM | POA: Diagnosis not present

## 2015-12-12 DIAGNOSIS — I255 Ischemic cardiomyopathy: Secondary | ICD-10-CM | POA: Diagnosis not present

## 2015-12-12 MED ORDER — TORSEMIDE 20 MG PO TABS: ORAL_TABLET | ORAL | Status: DC

## 2015-12-12 MED FILL — TORSEMIDE 20 MG TABLET: 20 | 30 days supply | Qty: 120 | Fill #0

## 2015-12-12 NOTE — Patient Instructions (Addendum)
Medication Instructions:  Stop lasix (furosemide).  Start torsemide 80mg  daily in the morning. This will be 4 of 20mg  tablets daily in the morning.   Labwork: BMET/BNP today.  BMET in 7 days.  Testing/Procedures: Your physician has requested that you have an echocardiogram. Echocardiography is a painless test that uses sound waves to create images of your heart. It provides your doctor with information about the size and shape of your heart and how well your heart's chambers and valves are working. This procedure takes approximately one hour. There are no restrictions for this procedure. In about 2 weeks the same day before the  appointment with Dr Aundra Dubin. Have this done at [facility] the same day you see Dr Aundra Dubin.    Follow-Up: Your physician recommends that you schedule a follow-up appointment in: about 2 weeks [facility] Aundra Dubin. Get the echocardiogram at the hospital the same day you see Dr Aundra Dubin before you see Dr Aundra Dubin

## 2015-12-12 NOTE — Progress Notes (Signed)
Patient ID: Marcus Johnson, Johnson   DOB: 30-Mar-1961, 55 y.o.   MRN: [id number] PCP: Dr. Sarajane Jews Cardiology: Dr. Aundra Dubin  HPI:  Marcus Johnson with h/o DM2, CAD s/p CABG 123XX123, systolic HF due to ischemic cardiomyopathy with EF 20-25% (echo 12/15), DM2 and CKD. He is s/p Medtronic single chamber ICD.  Admitted in 12/15 due to ADHF. Required short course milrinone for diuresis. Diuresed 30 pounds.   CPX 2/16 showed severely reduced functional capacity.  There was a significant disconnect between Marcus symptoms and the CPX.  RHC in 6/16 showed fairly normal filling pressures and low but not markedly low cardiac output.   He is still working full time at [employer]. He was lost to followup for about a year.  He was seen in the ER in 3/17 due to dyspnea/volume overload.  Lasix was increased to 160 qam/120 qpm and he was sent home from the ER.  He has lost some weight but is still above Marcus baseline.  He is 13 lbs heavier than last appointment.  He is mildly short of breath after walking 200-300 yards.  He is short of breath after walking up 3 flights of steps.  No orthopnea/PND.  He is taking all Marcus meds.   ECG: NSR, left axis deviation.   Studies: Echo (12/15): EF 20-25% RV moderately HK CPX (2/16): RER 1.10, peak VO2 8.6, VE/VCO2 slope 43.7 => severely reduced functional capacity.  RHC (6/16): mean RA 8, PA 48/19 mean 30, mean PCWP 14, CI 2.03 Fick/2.28 Thermo, PVR 3.6.   Labs:  8/15 LDL 88 12/15 K+ 4.2 creatinine 1.09  1/16 K 4.3, creatinine 1.34 4/16 K 4.5, creatinine 1.19 3/17 K 3.8, creatinine 1.22, HCT 35.3, BNP 1597  ROS: All systems negative except as listed in HPI, PMH and Problem List.  SH:  Social History   Social History  . Marital Status: Single    Spouse Name: N/A  . Number of Children: N/A  . Years of Education: N/A   Occupational History  . Not on file.   Social History Main Topics  . Smoking status: Never Smoker   . Smokeless tobacco: Never Used  . Alcohol  Use: No  . Drug Use: No  . Sexual Activity: Yes   Other Topics Concern  . Not on file   Social History Narrative   Lives in [place] with Marcus Johnson and Marcus family.  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    FH:  Family History  Problem Relation Age of Onset  . Diabetes Father     died in Marcus 69's  . Hypertension Mother     died in her 45's - had a ppm.    Past Medical History  Diagnosis Date  . Hypertension   . Coronary artery disease     a. s/p CABG 2002 b. LHC 5/13:  dLM 80%, LAD subtotally occluded, pCFX occluded, pRCA 50%, mid? Occlusion with high take off of the PDA with 70% multiple lesions-not bypassed and supplies collaterals to LAD, LIMA-IM/ramus ok, S-OM ok, S-PLA branches ok. Medical therapy was recommended  . Chronic systolic heart failure ([place])     a. Echo 5/13: Mild LVE, mild LVH, EF 10%, anteroseptal, lateral, apical AK, mild MR, mild LAE, moderately reduced RVSF, mild RAE, PASP 60;  b. 07/2014 Echo: EF 20-2%, diff HK, AKI of antsep/apical/mid-apicalinferior, mod reduced RV.  . Diabetes mellitus     type 2 requiring  insulin   . MRSA (methicillin resistant Staphylococcus aureus)     Status post right foot plantar deep infection with MRSA status post  I&D 10/2008  . Noncompliance   . Peripheral neuropathy ([place])   . Ischemic cardiomyopathy     a. Echo 5/13: Mild LVE, mild LVH, EF 10%, anteroseptal, lateral, apical AK, mild MR, mild LAE, moderately reduced RVSF, mild RAE, PASP 60;  b. 01/2012 s/p MDT [id number] Protecta XT VR AICD;  c. 07/2014 Echo: EF 20-2%, diff HK, AKI of antsep/apical/mid-apicalinferior, mod reduced RV.  Marland Kitchen Single-chamber  implantable cardiac defibrillator - Medtronic   . Syncope     Current Outpatient Prescriptions  Medication Sig Dispense Refill  . allopurinol (ZYLOPRIM) 300 MG tablet Take 300 mg by mouth daily.    Marland Kitchen aspirin EC 81 MG tablet Take 1 tablet (81 mg total) by mouth daily.    Marland Kitchen atorvastatin (LIPITOR) 80 MG  tablet Take 80 mg by mouth daily.    Marland Kitchen BAYER BREEZE 2 TEST DISK USE TO TEST BLOOD SUGAR 3 TIMES DAILY. 100 each 11  . carvedilol (COREG) 12.5 MG tablet Take 1.5 tablets (18.75 mg total) by mouth 2 (two) times daily with a meal. 90 tablet 11  . insulin aspart protamine - aspart (NOVOLOG MIX 70/30 FLEXPEN) (70-30) 100 UNIT/ML FlexPen Inject 0.05 mLs (5 Units total) into the skin 3 (three) times daily. 5 mL 11  . Insulin Pen Needle (BD PEN NEEDLE NANO U/F) 32G X 4 MM MISC USE 4 TIMES DAILY 100 each 11  . isosorbide-hydrALAZINE (BIDIL) 20-37.5 MG tablet Take 2 tablets by mouth 3 (three) times daily. 180 tablet 3  . LANTUS SOLOSTAR 100 UNIT/ML Solostar Pen INJECT 60 UNITS INTO THE SKIN DAILY AT 10 PM 15 mL 11  . sacubitril-valsartan (ENTRESTO) 49-51 MG Take 1 tablet by mouth 2 (two) times daily. 60 tablet 6  . spironolactone (ALDACTONE) 25 MG tablet Take 25 mg by mouth daily.    . [DISCONTINUED] furosemide (LASIX) 40 MG tablet TAKE 3 TABLETS BY MOUTH EVERY MORNING AND 2 TABLETS BY MOUTH EVERY EVENING 150 tablet 2  . torsemide (DEMADEX) 20 MG tablet 4 tablets (80mg ) daily at the same time in the morning 120 tablet 1   No current facility-administered medications for this visit.    Filed Vitals:   12/12/15 1501  BP: 126/72  Pulse: 90  Height: 5' 10.5" (1.791 m)  Weight: 225 lb 12.8 oz (102.422 kg)    PHYSICAL EXAM:  General:  Well appearing. No resp difficulty HEENT: normal Neck: supple. JVP 8 cm with HJR. Carotids 2+ bilaterally; no bruits. No lymphadenopathy or thryomegaly appreciated. Cor: PMI laterally displaced. Regular rate & rhythm. No rubs, gallops or murmurs. Lungs: clear Abdomen: soft, nontender, nondistended. No hepatosplenomegaly. No bruits or masses. Good bowel sounds. Extremities: no cyanosis, clubbing, rash.  Trace ankle edema bilaterally.  Neuro: alert & orientedx3, cranial nerves grossly intact. Moves all 4 extremities w/o difficulty. Affect pleasant.  ASSESSMENT &  PLAN: 1. Chronic systolic HF:  Ischemic CMP, EF 20-25% (Echo 12/15). Admit in 12/15, required short-term milrinone for low output.  Medtronic ICD, narrow QRS so not CRT candidate. Markedly abnormal CPX in 2/16 with poor functional capacity due to circulatory limitation, maximal effort. RHC in 6/16 showed near-normal filling pressures and low but not markedly low cardiac output. He describes NYHA class II symptoms.  However, he still appears volume overloaded on exam, though not markedly so.   - Stop Lasix and start torsemide 80 mg  daily.  BMET/BNP today and repeat in 7 days. - Continue current Coreg, Bidil, Entresto, and spironolactone.  At next appt, if BP looks ok, can increase Entresto.  - Repeat echo.  2. CAD s/p CABG: Last cath 2013 with patent grafts.  No chest pain.  - continue ASA, statin - Check lipids next appt.   3. HTN: BP not elevated.    F/u 2 wks in CHF clinic.   Dalton McLean,MD 12/12/2015

## 2015-12-13 LAB — BASIC METABOLIC PANEL
BUN: 30 mg/dL — ABNORMAL HIGH (ref 7–25)
CALCIUM: 9.3 mg/dL (ref 8.6–10.3)
CO2: 26 mmol/L (ref 20–31)
CREATININE: 1.17 mg/dL (ref 0.70–1.33)
Chloride: 103 mmol/L (ref 98–110)
Glucose, Bld: 101 mg/dL — ABNORMAL HIGH (ref 65–99)
Potassium: 4.4 mmol/L (ref 3.5–5.3)
SODIUM: 140 mmol/L (ref 135–146)

## 2015-12-13 LAB — BRAIN NATRIURETIC PEPTIDE: BRAIN NATRIURETIC PEPTIDE: 150.2 pg/mL — AB (ref <100)

## 2015-12-15 ENCOUNTER — Ambulatory Visit (INDEPENDENT_AMBULATORY_CARE_PROVIDER_SITE_OTHER): Payer: BLUE CROSS/BLUE SHIELD | Admitting: *Deleted

## 2015-12-15 DIAGNOSIS — I255 Ischemic cardiomyopathy: Secondary | ICD-10-CM | POA: Diagnosis not present

## 2015-12-15 NOTE — Progress Notes (Signed)
Remote ICD transmission.   

## 2015-12-19 ENCOUNTER — Other Ambulatory Visit (INDEPENDENT_AMBULATORY_CARE_PROVIDER_SITE_OTHER): Payer: BLUE CROSS/BLUE SHIELD | Admitting: *Deleted

## 2015-12-19 DIAGNOSIS — I255 Ischemic cardiomyopathy: Secondary | ICD-10-CM

## 2015-12-19 DIAGNOSIS — I5022 Chronic systolic (congestive) heart failure: Secondary | ICD-10-CM

## 2015-12-19 LAB — BASIC METABOLIC PANEL
BUN: 31 mg/dL — ABNORMAL HIGH (ref 7–25)
CHLORIDE: 101 mmol/L (ref 98–110)
CO2: 25 mmol/L (ref 20–31)
CREATININE: 1.25 mg/dL (ref 0.70–1.33)
Calcium: 9.5 mg/dL (ref 8.6–10.3)
Glucose, Bld: 189 mg/dL — ABNORMAL HIGH (ref 65–99)
POTASSIUM: 4.1 mmol/L (ref 3.5–5.3)
Sodium: 139 mmol/L (ref 135–146)

## 2015-12-19 NOTE — Addendum Note (Signed)
Addended by: Eulis Foster on: 12/19/2015 07:49 AM   Modules accepted: Orders

## 2015-12-29 ENCOUNTER — Encounter (HOSPITAL_COMMUNITY): Payer: Self-pay

## 2015-12-29 ENCOUNTER — Ambulatory Visit (HOSPITAL_BASED_OUTPATIENT_CLINIC_OR_DEPARTMENT_OTHER)
Admission: RE | Admit: 2015-12-29 | Discharge: 2015-12-29 | Disposition: A | Discharge status: Home / Self Care | Payer: BLUE CROSS/BLUE SHIELD | Source: Ambulatory Visit | Attending: Cardiology | Admitting: Cardiology

## 2015-12-29 ENCOUNTER — Ambulatory Visit (HOSPITAL_COMMUNITY)
Admission: RE | Admit: 2015-12-29 | Discharge: 2015-12-29 | Disposition: A | Discharge status: Home / Self Care | Payer: BLUE CROSS/BLUE SHIELD | Source: Ambulatory Visit | Attending: Family Medicine | Admitting: Family Medicine

## 2015-12-29 VITALS — BP 134/88 | HR 88 | Wt 226.5 lb

## 2015-12-29 DIAGNOSIS — I5022 Chronic systolic (congestive) heart failure: Secondary | ICD-10-CM

## 2015-12-29 DIAGNOSIS — I081 Rheumatic disorders of both mitral and tricuspid valves: Secondary | ICD-10-CM | POA: Insufficient documentation

## 2015-12-29 DIAGNOSIS — I255 Ischemic cardiomyopathy: Secondary | ICD-10-CM | POA: Insufficient documentation

## 2015-12-29 DIAGNOSIS — I2510 Atherosclerotic heart disease of native coronary artery without angina pectoris: Secondary | ICD-10-CM | POA: Diagnosis not present

## 2015-12-29 LAB — BASIC METABOLIC PANEL
Anion gap: 8 (ref 5–15)
BUN: 36 mg/dL — ABNORMAL HIGH (ref 6–20)
CALCIUM: 9.7 mg/dL (ref 8.9–10.3)
CHLORIDE: 102 mmol/L (ref 101–111)
CO2: 30 mmol/L (ref 22–32)
CREATININE: 1.14 mg/dL (ref 0.61–1.24)
GFR calc non Af Amer: >60 mL/min (ref >60)
Glucose, Bld: 110 mg/dL — ABNORMAL HIGH (ref 65–99)
Potassium: 3.9 mmol/L (ref 3.5–5.1)
SODIUM: 140 mmol/L (ref 135–145)

## 2015-12-29 LAB — LIPID PANEL
CHOLESTEROL: 124 mg/dL (ref 0–200)
HDL: 23 mg/dL — ABNORMAL LOW (ref >40)
LDL Cholesterol: 74 mg/dL (ref 0–99)
Total CHOL/HDL Ratio: 5.4 RATIO
Triglycerides: 137 mg/dL (ref <150)
VLDL: 27 mg/dL (ref 0–40)

## 2015-12-29 LAB — BRAIN NATRIURETIC PEPTIDE: B NATRIURETIC PEPTIDE 5: 265 pg/mL — AB (ref 0.0–100.0)

## 2015-12-29 MED ORDER — PERFLUTREN LIPID MICROSPHERE
1.0000 mL | INTRAVENOUS | Status: AC | PRN
  Administered 2015-12-29: 2 mL via INTRAVENOUS
  Filled 2015-12-29: qty 10

## 2015-12-29 MED ORDER — SACUBITRIL-VALSARTAN 97-103 MG PO TABS: 1.0000 | ORAL_TABLET | Freq: Two times a day (BID) | ORAL | Status: DC

## 2015-12-29 MED ORDER — PERFLUTREN LIPID MICROSPHERE
INTRAVENOUS | Status: AC
  Filled 2015-12-29: qty 10

## 2015-12-29 NOTE — Patient Instructions (Signed)
Increase Entresto to 97/103 mg Twice daily   Labs today  Labs in 2 weeks  Your physician has recommended that you have a cardiopulmonary stress test (CPX). CPX testing is a non-invasive measurement of heart and lung function. It replaces a traditional treadmill stress test. This type of test provides a tremendous amount of information that relates not only to your present condition but also for future outcomes. This test combines measurements of you ventilation, respiratory gas exchange in the lungs, electrocardiogram (EKG), blood pressure and physical response before, during, and following an exercise protocol.  Your physician recommends that you schedule a follow-up appointment in: 6 weeks

## 2015-12-29 NOTE — Progress Notes (Signed)
  Echocardiogram 2D Echocardiogram with Definity has been performed.  Jennette Dubin 12/29/2015, 3:15 PM

## 2015-12-30 NOTE — Progress Notes (Signed)
Patient ID: Marcus Johnson, male   DOB: 09-22-1960, 55 y.o.   MRN: [id number] PCP: Dr. Sarajane Jews Cardiology: Dr. Aundra Dubin  HPI:  Mr. Kohlmeyer is a 55 y/o male with h/o DM2, CAD s/p CABG 123XX123, systolic HF due to ischemic cardiomyopathy with EF 20-25% (echo 12/15), DM2 and CKD. He is s/p Medtronic single chamber ICD.  Admitted in 12/15 due to ADHF. Required short course milrinone for diuresis. Diuresed 30 pounds.   CPX 2/16 showed severely reduced functional capacity.  There was a significant disconnect between his symptoms and the CPX.  RHC in 6/16 showed fairly normal filling pressures and low but not markedly low cardiac output.   He is still working full time at [employer]. He was lost to followup for about a year.  He was seen in the ER in 3/17 due to dyspnea/volume overload.  Lasix was increased to 160 qam/120 qpm and he was sent home from the ER.  I saw him earlier in 5/17 and stopped Lasix and started him on torsemide.  He says that he is urinating better with torsemide.  Breathing seems better => no dyspnea walking on flat ground and can walk up 3 flights of steps without problems.  No chest pain, orthopnea, PND, lightheadedness, or palpitations.  I reviewed today's echo, EF remains about 25%.   Studies: Echo (12/15): EF 20-25% RV moderately HK CPX (2/16): RER 1.10, peak VO2 8.6, VE/VCO2 slope 43.7 => severely reduced functional capacity.  RHC (6/16): mean RA 8, PA 48/19 mean 30, mean PCWP 14, CI 2.03 Fick/2.28 Thermo, PVR 3.6.  Echo (5/17): EF 25%, mild LV dilation, moderate diastolic dysfunction, normal RV size with mild to moderate systolic dysfunction, mild mitral regurgitation  Labs:  8/15 LDL 88 12/15 K+ 4.2 creatinine 1.09  1/16 K 4.3, creatinine 1.34 4/16 K 4.5, creatinine 1.19 3/17 K 3.8, creatinine 1.22, HCT 35.3, BNP 1597 5/17 K 4.1, creatinine 1.25  ROS: All systems negative except as listed in HPI, PMH and Problem List.  SH:  Social History   Social History  . Marital  Status: Single    Spouse Name: N/A  . Number of Children: N/A  . Years of Education: N/A   Occupational History  . Not on file.   Social History Main Topics  . Smoking status: Never Smoker   . Smokeless tobacco: Never Used  . Alcohol Use: No  . Drug Use: No  . Sexual Activity: Yes   Other Topics Concern  . Not on file   Social History Narrative   Lives in [place] with his son and his family.  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    FH:  Family History  Problem Relation Age of Onset  . Diabetes Father     died in his 32's  . Hypertension Mother     died in her 39's - had a ppm.    Past Medical History  Diagnosis Date  . Hypertension   . Coronary artery disease     a. s/p CABG 2002 b. LHC 5/13:  dLM 80%, LAD subtotally occluded, pCFX occluded, pRCA 50%, mid? Occlusion with high take off of the PDA with 70% multiple lesions-not bypassed and supplies collaterals to LAD, LIMA-IM/ramus ok, S-OM ok, S-PLA branches ok. Medical therapy was recommended  . Chronic systolic heart failure ([place])     a. Echo 5/13: Mild LVE, mild LVH, EF 10%, anteroseptal, lateral, apical AK, mild MR, mild LAE, moderately reduced RVSF,  mild RAE, PASP 60;  b. 07/2014 Echo: EF 20-2%, diff HK, AKI of antsep/apical/mid-apicalinferior, mod reduced RV.  . Diabetes mellitus     type 2 requiring insulin   . MRSA (methicillin resistant Staphylococcus aureus)     Status post right foot plantar deep infection with MRSA status post  I&D 10/2008  . Noncompliance   . Peripheral neuropathy ([place])   . Ischemic cardiomyopathy     a. Echo 5/13: Mild LVE, mild LVH, EF 10%, anteroseptal, lateral, apical AK, mild MR, mild LAE, moderately reduced RVSF, mild RAE, PASP 60;  b. 01/2012 s/p MDT [id number] Protecta XT VR AICD;  c. 07/2014 Echo: EF 20-2%, diff HK, AKI of antsep/apical/mid-apicalinferior, mod reduced RV.  Marland Kitchen Single-chamber  implantable cardiac defibrillator - Medtronic   . Syncope      Current Outpatient Prescriptions  Medication Sig Dispense Refill  . allopurinol (ZYLOPRIM) 300 MG tablet Take 300 mg by mouth daily.    Marland Kitchen aspirin EC 81 MG tablet Take 1 tablet (81 mg total) by mouth daily.    Marland Kitchen atorvastatin (LIPITOR) 80 MG tablet Take 80 mg by mouth daily.    Marland Kitchen BAYER BREEZE 2 TEST DISK USE TO TEST BLOOD SUGAR 3 TIMES DAILY. 100 each 11  . carvedilol (COREG) 12.5 MG tablet Take 1.5 tablets (18.75 mg total) by mouth 2 (two) times daily with a meal. 90 tablet 11  . insulin aspart protamine - aspart (NOVOLOG MIX 70/30 FLEXPEN) (70-30) 100 UNIT/ML FlexPen Inject 0.05 mLs (5 Units total) into the skin 3 (three) times daily. 5 mL 11  . Insulin Pen Needle (BD PEN NEEDLE NANO U/F) 32G X 4 MM MISC USE 4 TIMES DAILY 100 each 11  . isosorbide-hydrALAZINE (BIDIL) 20-37.5 MG tablet Take 2 tablets by mouth 3 (three) times daily. 180 tablet 3  . LANTUS SOLOSTAR 100 UNIT/ML Solostar Pen INJECT 60 UNITS INTO THE SKIN DAILY AT 10 PM 15 mL 11  . spironolactone (ALDACTONE) 25 MG tablet Take 25 mg by mouth daily.    Marland Kitchen torsemide (DEMADEX) 20 MG tablet 4 tablets (80mg ) daily at the same time in the morning 120 tablet 1  . sacubitril-valsartan (ENTRESTO) 97-103 MG Take 1 tablet by mouth 2 (two) times daily. 60 tablet 6  . [DISCONTINUED] furosemide (LASIX) 40 MG tablet TAKE 3 TABLETS BY MOUTH EVERY MORNING AND 2 TABLETS BY MOUTH EVERY EVENING 150 tablet 2   No current facility-administered medications for this encounter.    Filed Vitals:   12/29/15 1512  BP: 134/88  Pulse: 88  Weight: 226 lb 8 oz (102.74 kg)  SpO2: 96%    PHYSICAL EXAM:  General:  Well appearing. No resp difficulty HEENT: normal Neck: supple. JVP 7 cm. Carotids 2+ bilaterally; no bruits. No lymphadenopathy or thryomegaly appreciated. Cor: PMI laterally displaced. Regular rate & rhythm. No rubs, gallops or murmurs. Lungs: clear Abdomen: soft, nontender, nondistended. No hepatosplenomegaly. No bruits or masses. Good  bowel sounds. Extremities: no cyanosis, clubbing, rash.  Trace ankle edema bilaterally.  Neuro: alert & orientedx3, cranial nerves grossly intact. Moves all 4 extremities w/o difficulty. Affect pleasant.  ASSESSMENT & PLAN: 1. Chronic systolic HF:  Ischemic CMP, EF 20-25% (Echo 12/15). Repeat echo 5/17 with EF 25%.  Admit in 12/15, required short-term milrinone for low output.  Medtronic ICD, narrow QRS so not CRT candidate. Markedly abnormal CPX in 2/16 with poor functional capacity due to circulatory limitation, maximal effort. RHC in 6/16 showed near-normal filling pressures and low but not markedly low  cardiac output. He describes NYHA class II symptoms.  Volume status improved on torsemide.   - Continue torsemide 80 mg daily.  BMET/BNP today. - Continue current Coreg, Bidil, and spironolactone.   - Increase Entresto to 97/103 bid and check BMET in 2 wks.  - Repeat CPX.  2. CAD s/p CABG: Last cath 2013 with patent grafts.  No chest pain.  - continue ASA, statin - Check lipids today.    3. HTN: BP not elevated.    F/u 6 wks.   Maiyah Goyne,MD 12/30/2015

## 2016-01-11 ENCOUNTER — Encounter: Payer: Self-pay | Admitting: Cardiology

## 2016-01-16 ENCOUNTER — Encounter (HOSPITAL_COMMUNITY): Payer: BLUE CROSS/BLUE SHIELD

## 2016-01-18 LAB — CUP PACEART REMOTE DEVICE CHECK
Battery Voltage: 3.1 V
Brady Statistic RV Percent Paced: 0.01 %
HIGH POWER IMPEDANCE MEASURED VALUE: 266 Ohm
HIGH POWER IMPEDANCE MEASURED VALUE: 38 Ohm
HighPow Impedance: 33 Ohm
Implantable Lead Model: 6947
Lead Channel Impedance Value: 323 Ohm
Lead Channel Pacing Threshold Amplitude: 0.75 V
Lead Channel Sensing Intrinsic Amplitude: 9.875 mV
Lead Channel Sensing Intrinsic Amplitude: 9.875 mV
Lead Channel Setting Pacing Amplitude: 2.5 V
MDC IDC LEAD IMPLANT DT: 20130603
MDC IDC LEAD LOCATION: 753860
MDC IDC MSMT LEADCHNL RV PACING THRESHOLD PULSEWIDTH: 0.4 ms
MDC IDC SESS DTM: 20170511084227
MDC IDC SET LEADCHNL RV PACING PULSEWIDTH: 0.4 ms
MDC IDC SET LEADCHNL RV SENSING SENSITIVITY: 0.3 mV

## 2016-02-01 ENCOUNTER — Telehealth: Payer: Self-pay | Admitting: Family Medicine

## 2016-02-01 ENCOUNTER — Other Ambulatory Visit (HOSPITAL_COMMUNITY): Payer: Self-pay | Admitting: *Deleted

## 2016-02-01 DIAGNOSIS — I255 Ischemic cardiomyopathy: Secondary | ICD-10-CM

## 2016-02-01 DIAGNOSIS — I5022 Chronic systolic (congestive) heart failure: Secondary | ICD-10-CM

## 2016-02-01 MED ORDER — ATORVASTATIN CALCIUM 80 MG PO TABS: 80.0000 mg | ORAL_TABLET | Freq: Every day | ORAL | Status: DC

## 2016-02-01 MED ORDER — TORSEMIDE 20 MG PO TABS: ORAL_TABLET | ORAL | Status: DC

## 2016-02-01 NOTE — Telephone Encounter (Signed)
Refill request for Spironolactone 25 mg take 1 po qd and a 90 day supply to [facility]. Is this something that you are going to write for?

## 2016-02-02 ENCOUNTER — Other Ambulatory Visit: Payer: Self-pay | Admitting: Family Medicine

## 2016-02-02 NOTE — Telephone Encounter (Signed)
Call in #[phone] with 5 rf 

## 2016-02-03 MED ORDER — SPIRONOLACTONE 25 MG PO TABS: 25.0000 mg | ORAL_TABLET | Freq: Every day | ORAL | Status: DC

## 2016-02-03 NOTE — Telephone Encounter (Signed)
Refill sent to pharmacy.   

## 2016-02-13 ENCOUNTER — Encounter (HOSPITAL_COMMUNITY): Payer: BLUE CROSS/BLUE SHIELD

## 2016-03-15 ENCOUNTER — Ambulatory Visit (INDEPENDENT_AMBULATORY_CARE_PROVIDER_SITE_OTHER): Payer: BLUE CROSS/BLUE SHIELD | Admitting: *Deleted

## 2016-03-15 DIAGNOSIS — I255 Ischemic cardiomyopathy: Secondary | ICD-10-CM

## 2016-03-15 NOTE — Progress Notes (Signed)
Remote ICD transmission.   

## 2016-03-19 LAB — CUP PACEART REMOTE DEVICE CHECK
Battery Voltage: 3.08 V
Brady Statistic RV Percent Paced: 0 %
HIGH POWER IMPEDANCE MEASURED VALUE: 266 Ohm
HIGH POWER IMPEDANCE MEASURED VALUE: 33 Ohm
HighPow Impedance: 39 Ohm
Lead Channel Impedance Value: 323 Ohm
Lead Channel Pacing Threshold Pulse Width: 0.4 ms
Lead Channel Sensing Intrinsic Amplitude: 8.875 mV
Lead Channel Setting Pacing Amplitude: 2.5 V
Lead Channel Setting Sensing Sensitivity: 0.3 mV
MDC IDC LEAD IMPLANT DT: 20130603
MDC IDC LEAD LOCATION: 753860
MDC IDC MSMT LEADCHNL RV PACING THRESHOLD AMPLITUDE: 0.75 V
MDC IDC MSMT LEADCHNL RV SENSING INTR AMPL: 8.875 mV
MDC IDC SESS DTM: 20170810073327
MDC IDC SET LEADCHNL RV PACING PULSEWIDTH: 0.4 ms

## 2016-03-20 ENCOUNTER — Encounter: Payer: Self-pay | Admitting: Cardiology

## 2016-03-25 ENCOUNTER — Other Ambulatory Visit: Payer: Self-pay | Admitting: Family Medicine

## 2016-04-13 DIAGNOSIS — L02611 Cutaneous abscess of right foot: Secondary | ICD-10-CM | POA: Diagnosis not present

## 2016-04-13 DIAGNOSIS — S90851A Superficial foreign body, right foot, initial encounter: Secondary | ICD-10-CM | POA: Diagnosis not present

## 2016-04-15 ENCOUNTER — Observation Stay (HOSPITAL_COMMUNITY)
Admission: EM | Admit: 2016-04-15 | Discharge: 2016-04-18 | Disposition: A | Discharge status: Home / Self Care | Payer: BLUE CROSS/BLUE SHIELD | Attending: Internal Medicine | Admitting: Internal Medicine

## 2016-04-15 ENCOUNTER — Observation Stay (HOSPITAL_COMMUNITY): Payer: BLUE CROSS/BLUE SHIELD

## 2016-04-15 ENCOUNTER — Encounter (HOSPITAL_COMMUNITY): Payer: Self-pay

## 2016-04-15 DIAGNOSIS — E785 Hyperlipidemia, unspecified: Secondary | ICD-10-CM

## 2016-04-15 DIAGNOSIS — L089 Local infection of the skin and subcutaneous tissue, unspecified: Secondary | ICD-10-CM

## 2016-04-15 DIAGNOSIS — Z8614 Personal history of Methicillin resistant Staphylococcus aureus infection: Secondary | ICD-10-CM | POA: Insufficient documentation

## 2016-04-15 DIAGNOSIS — I5022 Chronic systolic (congestive) heart failure: Secondary | ICD-10-CM

## 2016-04-15 DIAGNOSIS — I130 Hypertensive heart and chronic kidney disease with heart failure and stage 1 through stage 4 chronic kidney disease, or unspecified chronic kidney disease: Secondary | ICD-10-CM | POA: Diagnosis not present

## 2016-04-15 DIAGNOSIS — L97419 Non-pressure chronic ulcer of right heel and midfoot with unspecified severity: Secondary | ICD-10-CM | POA: Insufficient documentation

## 2016-04-15 DIAGNOSIS — I252 Old myocardial infarction: Secondary | ICD-10-CM | POA: Diagnosis not present

## 2016-04-15 DIAGNOSIS — N183 Chronic kidney disease, stage 3 unspecified: Secondary | ICD-10-CM | POA: Diagnosis present

## 2016-04-15 DIAGNOSIS — Z7982 Long term (current) use of aspirin: Secondary | ICD-10-CM | POA: Diagnosis not present

## 2016-04-15 DIAGNOSIS — E669 Obesity, unspecified: Secondary | ICD-10-CM | POA: Diagnosis not present

## 2016-04-15 DIAGNOSIS — Z794 Long term (current) use of insulin: Secondary | ICD-10-CM | POA: Insufficient documentation

## 2016-04-15 DIAGNOSIS — Z95810 Presence of automatic (implantable) cardiac defibrillator: Secondary | ICD-10-CM

## 2016-04-15 DIAGNOSIS — R739 Hyperglycemia, unspecified: Secondary | ICD-10-CM

## 2016-04-15 DIAGNOSIS — Z9111 Patient's noncompliance with dietary regimen: Secondary | ICD-10-CM | POA: Insufficient documentation

## 2016-04-15 DIAGNOSIS — E11628 Type 2 diabetes mellitus with other skin complications: Secondary | ICD-10-CM | POA: Diagnosis not present

## 2016-04-15 DIAGNOSIS — N179 Acute kidney failure, unspecified: Secondary | ICD-10-CM | POA: Diagnosis not present

## 2016-04-15 DIAGNOSIS — I5042 Chronic combined systolic (congestive) and diastolic (congestive) heart failure: Secondary | ICD-10-CM | POA: Insufficient documentation

## 2016-04-15 DIAGNOSIS — I10 Essential (primary) hypertension: Secondary | ICD-10-CM | POA: Diagnosis present

## 2016-04-15 DIAGNOSIS — I255 Ischemic cardiomyopathy: Secondary | ICD-10-CM | POA: Diagnosis not present

## 2016-04-15 DIAGNOSIS — L02611 Cutaneous abscess of right foot: Principal | ICD-10-CM | POA: Insufficient documentation

## 2016-04-15 DIAGNOSIS — Z8249 Family history of ischemic heart disease and other diseases of the circulatory system: Secondary | ICD-10-CM | POA: Diagnosis not present

## 2016-04-15 DIAGNOSIS — E11621 Type 2 diabetes mellitus with foot ulcer: Secondary | ICD-10-CM | POA: Diagnosis not present

## 2016-04-15 DIAGNOSIS — E1100 Type 2 diabetes mellitus with hyperosmolarity without nonketotic hyperglycemic-hyperosmolar coma (NKHHC): Secondary | ICD-10-CM | POA: Insufficient documentation

## 2016-04-15 DIAGNOSIS — E1122 Type 2 diabetes mellitus with diabetic chronic kidney disease: Secondary | ICD-10-CM | POA: Insufficient documentation

## 2016-04-15 DIAGNOSIS — Z833 Family history of diabetes mellitus: Secondary | ICD-10-CM | POA: Diagnosis not present

## 2016-04-15 DIAGNOSIS — E1165 Type 2 diabetes mellitus with hyperglycemia: Secondary | ICD-10-CM | POA: Diagnosis not present

## 2016-04-15 DIAGNOSIS — I2510 Atherosclerotic heart disease of native coronary artery without angina pectoris: Secondary | ICD-10-CM | POA: Diagnosis not present

## 2016-04-15 DIAGNOSIS — E1140 Type 2 diabetes mellitus with diabetic neuropathy, unspecified: Secondary | ICD-10-CM | POA: Diagnosis not present

## 2016-04-15 DIAGNOSIS — Z9119 Patient's noncompliance with other medical treatment and regimen: Secondary | ICD-10-CM | POA: Diagnosis not present

## 2016-04-15 DIAGNOSIS — E860 Dehydration: Secondary | ICD-10-CM

## 2016-04-15 DIAGNOSIS — R531 Weakness: Secondary | ICD-10-CM

## 2016-04-15 DIAGNOSIS — Z951 Presence of aortocoronary bypass graft: Secondary | ICD-10-CM | POA: Insufficient documentation

## 2016-04-15 DIAGNOSIS — L97519 Non-pressure chronic ulcer of other part of right foot with unspecified severity: Secondary | ICD-10-CM | POA: Diagnosis not present

## 2016-04-15 DIAGNOSIS — Z91199 Patient's noncompliance with other medical treatment and regimen due to unspecified reason: Secondary | ICD-10-CM | POA: Insufficient documentation

## 2016-04-15 DIAGNOSIS — R262 Difficulty in walking, not elsewhere classified: Secondary | ICD-10-CM | POA: Insufficient documentation

## 2016-04-15 LAB — CBC WITH DIFFERENTIAL/PLATELET
BASOS PCT: 0 %
Basophils Absolute: 0 10*3/uL (ref 0.0–0.1)
EOS ABS: 0.1 10*3/uL (ref 0.0–0.7)
EOS PCT: 1 %
HCT: 38.1 % — ABNORMAL LOW (ref 39.0–52.0)
HEMOGLOBIN: 12.3 g/dL — AB (ref 13.0–17.0)
Lymphocytes Relative: 21 %
Lymphs Abs: 2 10*3/uL (ref 0.7–4.0)
MCH: 27.3 pg (ref 26.0–34.0)
MCHC: 32.3 g/dL (ref 30.0–36.0)
MCV: 84.5 fL (ref 78.0–100.0)
MONOS PCT: 7 %
Monocytes Absolute: 0.7 10*3/uL (ref 0.1–1.0)
NEUTROS PCT: 71 %
Neutro Abs: 6.7 10*3/uL (ref 1.7–7.7)
PLATELETS: 196 10*3/uL (ref 150–400)
RBC: 4.51 MIL/uL (ref 4.22–5.81)
RDW: 13.7 % (ref 11.5–15.5)
WBC: 9.4 10*3/uL (ref 4.0–10.5)

## 2016-04-15 LAB — URINALYSIS, ROUTINE W REFLEX MICROSCOPIC
Bilirubin Urine: NEGATIVE
Glucose, UA: >1000 mg/dL — AB
HGB URINE DIPSTICK: NEGATIVE
Ketones, ur: NEGATIVE mg/dL
LEUKOCYTES UA: NEGATIVE
Nitrite: NEGATIVE
Protein, ur: NEGATIVE mg/dL
SPECIFIC GRAVITY, URINE: 1.027 (ref 1.005–1.030)
pH: 6.5 (ref 5.0–8.0)

## 2016-04-15 LAB — BASIC METABOLIC PANEL
ANION GAP: 13 (ref 5–15)
BUN: 39 mg/dL — ABNORMAL HIGH (ref 6–20)
CHLORIDE: 93 mmol/L — AB (ref 101–111)
CO2: 22 mmol/L (ref 22–32)
Calcium: 9.9 mg/dL (ref 8.9–10.3)
Creatinine, Ser: 1.48 mg/dL — ABNORMAL HIGH (ref 0.61–1.24)
GFR calc non Af Amer: 52 mL/min — ABNORMAL LOW (ref >60)
GFR, EST AFRICAN AMERICAN: 60 mL/min — AB (ref >60)
Glucose, Bld: 703 mg/dL (ref 65–99)
Potassium: 4.9 mmol/L (ref 3.5–5.1)
Sodium: 128 mmol/L — ABNORMAL LOW (ref 135–145)

## 2016-04-15 LAB — CBC
HCT: 41.4 % (ref 39.0–52.0)
HEMOGLOBIN: 13.8 g/dL (ref 13.0–17.0)
MCH: 28 pg (ref 26.0–34.0)
MCHC: 33.3 g/dL (ref 30.0–36.0)
MCV: 84 fL (ref 78.0–100.0)
Platelets: 196 10*3/uL (ref 150–400)
RBC: 4.93 MIL/uL (ref 4.22–5.81)
RDW: 13.9 % (ref 11.5–15.5)
WBC: 9.2 10*3/uL (ref 4.0–10.5)

## 2016-04-15 LAB — URINE MICROSCOPIC-ADD ON
BACTERIA UA: NONE SEEN
RBC / HPF: NONE SEEN RBC/hpf (ref 0–5)
WBC UA: NONE SEEN WBC/hpf (ref 0–5)

## 2016-04-15 LAB — CBG MONITORING, ED
GLUCOSE-CAPILLARY: 462 mg/dL — AB (ref 65–99)
Glucose-Capillary: >600 mg/dL (ref 65–99)

## 2016-04-15 MED ORDER — CARVEDILOL 12.5 MG PO TABS: 18.7500 mg | ORAL_TABLET | Freq: Two times a day (BID) | ORAL | Status: DC

## 2016-04-15 MED ORDER — SODIUM CHLORIDE 0.9 % IV SOLN: INTRAVENOUS | Status: DC

## 2016-04-15 MED ORDER — INSULIN ASPART 100 UNIT/ML ~~LOC~~ SOLN
16.0000 [IU] | Freq: Once | SUBCUTANEOUS | Status: AC
  Administered 2016-04-15: 16 [IU] via SUBCUTANEOUS
  Filled 2016-04-15: qty 1

## 2016-04-15 MED ORDER — ENOXAPARIN SODIUM 40 MG/0.4ML ~~LOC~~ SOLN
40.0000 mg | SUBCUTANEOUS | Status: DC
  Administered 2016-04-16: 40 mg via SUBCUTANEOUS
  Filled 2016-04-15: qty 0.4

## 2016-04-15 MED ORDER — ACETAMINOPHEN 325 MG PO TABS
650.0000 mg | ORAL_TABLET | Freq: Four times a day (QID) | ORAL | Status: DC | PRN
Start: 2016-04-15 — End: 2016-04-18
  Administered 2016-04-16: 650 mg via ORAL
  Filled 2016-04-15: qty 2

## 2016-04-15 MED ORDER — ASPIRIN EC 81 MG PO TBEC
81.0000 mg | DELAYED_RELEASE_TABLET | Freq: Every day | ORAL | Status: DC
  Administered 2016-04-16 – 2016-04-18 (×3): 81 mg via ORAL
  Filled 2016-04-15 (×3): qty 1

## 2016-04-15 MED ORDER — INSULIN ASPART 100 UNIT/ML ~~LOC~~ SOLN
0.0000 [IU] | Freq: Three times a day (TID) | SUBCUTANEOUS | Status: DC
  Administered 2016-04-16: 3 [IU] via SUBCUTANEOUS

## 2016-04-15 MED ORDER — OXYCODONE-ACETAMINOPHEN 5-325 MG PO TABS
1.0000 | ORAL_TABLET | ORAL | Status: DC | PRN
  Filled 2016-04-15: qty 1

## 2016-04-15 MED ORDER — INSULIN GLARGINE 100 UNIT/ML ~~LOC~~ SOLN
60.0000 [IU] | Freq: Every day | SUBCUTANEOUS | Status: DC
  Administered 2016-04-16 – 2016-04-17 (×3): 60 [IU] via SUBCUTANEOUS
  Filled 2016-04-15 (×4): qty 0.6

## 2016-04-15 MED ORDER — SODIUM CHLORIDE 0.9 % IV BOLUS (SEPSIS)
1000.0000 mL | Freq: Once | INTRAVENOUS | Status: AC
  Administered 2016-04-15: 1000 mL via INTRAVENOUS

## 2016-04-15 MED ORDER — ISOSORB DINITRATE-HYDRALAZINE 20-37.5 MG PO TABS
2.0000 | ORAL_TABLET | Freq: Three times a day (TID) | ORAL | Status: DC
  Administered 2016-04-16 – 2016-04-18 (×6): 2 via ORAL
  Filled 2016-04-15 (×8): qty 2

## 2016-04-15 MED ORDER — PANTOPRAZOLE SODIUM 40 MG PO TBEC
40.0000 mg | DELAYED_RELEASE_TABLET | Freq: Every day | ORAL | Status: DC
  Administered 2016-04-16 – 2016-04-18 (×3): 40 mg via ORAL
  Filled 2016-04-15 (×3): qty 1

## 2016-04-15 MED ORDER — ATORVASTATIN CALCIUM 80 MG PO TABS
80.0000 mg | ORAL_TABLET | Freq: Every day | ORAL | Status: DC
  Administered 2016-04-16 – 2016-04-18 (×3): 80 mg via ORAL
  Filled 2016-04-15 (×3): qty 1

## 2016-04-15 MED ORDER — ALLOPURINOL 300 MG PO TABS
300.0000 mg | ORAL_TABLET | Freq: Every day | ORAL | Status: DC
  Administered 2016-04-16 – 2016-04-18 (×3): 300 mg via ORAL
  Filled 2016-04-15 (×3): qty 1

## 2016-04-15 MED ORDER — AMOXICILLIN-POT CLAVULANATE 875-125 MG PO TABS
1.0000 | ORAL_TABLET | Freq: Two times a day (BID) | ORAL | Status: DC
  Administered 2016-04-16 – 2016-04-18 (×6): 1 via ORAL
  Filled 2016-04-15 (×6): qty 1

## 2016-04-15 MED ORDER — CARVEDILOL 6.25 MG PO TABS
18.7500 mg | ORAL_TABLET | Freq: Two times a day (BID) | ORAL | Status: DC
  Administered 2016-04-16 – 2016-04-18 (×4): 18.75 mg via ORAL
  Filled 2016-04-15 (×5): qty 1

## 2016-04-15 MED ORDER — SODIUM CHLORIDE 0.9% FLUSH
3.0000 mL | Freq: Two times a day (BID) | INTRAVENOUS | Status: DC
  Administered 2016-04-16 – 2016-04-18 (×6): 3 mL via INTRAVENOUS

## 2016-04-15 MED ORDER — ACETAMINOPHEN 650 MG RE SUPP
650.0000 mg | Freq: Four times a day (QID) | RECTAL | Status: DC | PRN
Start: 2016-04-15 — End: 2016-04-18

## 2016-04-15 MED ORDER — SACUBITRIL-VALSARTAN 97-103 MG PO TABS
1.0000 | ORAL_TABLET | Freq: Two times a day (BID) | ORAL | Status: DC
  Administered 2016-04-16 – 2016-04-18 (×6): 1 via ORAL
  Filled 2016-04-15 (×6): qty 1

## 2016-04-15 NOTE — ED Triage Notes (Signed)
Patient complains of blood sugar running high the past few days, alert and oriented on arrival. BS greater than 600 on arrival. Also concerned of right foot heel pain with wound to same, taking antibiotic as prescribed.  Complains of fatigue.

## 2016-04-15 NOTE — ED Notes (Signed)
Taken to CT at this time. 
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
## 2016-04-15 NOTE — ED Notes (Signed)
Attempted report.  Nurse to call back when available. 

## 2016-04-16 DIAGNOSIS — I10 Essential (primary) hypertension: Secondary | ICD-10-CM

## 2016-04-16 DIAGNOSIS — N179 Acute kidney failure, unspecified: Secondary | ICD-10-CM | POA: Diagnosis not present

## 2016-04-16 DIAGNOSIS — I5042 Chronic combined systolic (congestive) and diastolic (congestive) heart failure: Secondary | ICD-10-CM

## 2016-04-16 DIAGNOSIS — Z95810 Presence of automatic (implantable) cardiac defibrillator: Secondary | ICD-10-CM | POA: Diagnosis not present

## 2016-04-16 DIAGNOSIS — E1169 Type 2 diabetes mellitus with other specified complication: Secondary | ICD-10-CM | POA: Diagnosis not present

## 2016-04-16 LAB — CBC
HCT: 37.9 % — ABNORMAL LOW (ref 39.0–52.0)
HEMOGLOBIN: 12.4 g/dL — AB (ref 13.0–17.0)
MCH: 27.6 pg (ref 26.0–34.0)
MCHC: 32.7 g/dL (ref 30.0–36.0)
MCV: 84.2 fL (ref 78.0–100.0)
PLATELETS: 193 10*3/uL (ref 150–400)
RBC: 4.5 MIL/uL (ref 4.22–5.81)
RDW: 13.8 % (ref 11.5–15.5)
WBC: 9.9 10*3/uL (ref 4.0–10.5)

## 2016-04-16 LAB — HIV ANTIBODY (ROUTINE TESTING W REFLEX): HIV SCREEN 4TH GENERATION: NONREACTIVE

## 2016-04-16 LAB — BASIC METABOLIC PANEL
ANION GAP: 8 (ref 5–15)
BUN: 34 mg/dL — ABNORMAL HIGH (ref 6–20)
CALCIUM: 9.1 mg/dL (ref 8.9–10.3)
CHLORIDE: 110 mmol/L (ref 101–111)
CO2: 20 mmol/L — AB (ref 22–32)
CREATININE: 1.33 mg/dL — AB (ref 0.61–1.24)
GFR calc non Af Amer: 59 mL/min — ABNORMAL LOW (ref >60)
Glucose, Bld: 150 mg/dL — ABNORMAL HIGH (ref 65–99)
Potassium: 4.2 mmol/L (ref 3.5–5.1)
SODIUM: 138 mmol/L (ref 135–145)

## 2016-04-16 LAB — SEDIMENTATION RATE: SED RATE: 15 mm/h (ref 0–16)

## 2016-04-16 LAB — PROTIME-INR
INR: 1.16
Prothrombin Time: 14.9 seconds (ref 11.4–15.2)

## 2016-04-16 LAB — BRAIN NATRIURETIC PEPTIDE: B NATRIURETIC PEPTIDE 5: 203.1 pg/mL — AB (ref 0.0–100.0)

## 2016-04-16 LAB — C-REACTIVE PROTEIN: CRP: 3.4 mg/dL — AB (ref <1.0)

## 2016-04-16 LAB — GLUCOSE, CAPILLARY
GLUCOSE-CAPILLARY: 108 mg/dL — AB (ref 65–99)
GLUCOSE-CAPILLARY: 220 mg/dL — AB (ref 65–99)
GLUCOSE-CAPILLARY: 334 mg/dL — AB (ref 65–99)
Glucose-Capillary: 216 mg/dL — ABNORMAL HIGH (ref 65–99)
Glucose-Capillary: 313 mg/dL — ABNORMAL HIGH (ref 65–99)

## 2016-04-16 LAB — CREATININE, URINE, RANDOM: Creatinine, Urine: 40.06 mg/dL

## 2016-04-16 MED ORDER — INSULIN ASPART 100 UNIT/ML ~~LOC~~ SOLN
5.0000 [IU] | Freq: Once | SUBCUTANEOUS | Status: AC
  Administered 2016-04-16: 5 [IU] via SUBCUTANEOUS

## 2016-04-16 MED ORDER — INSULIN ASPART 100 UNIT/ML ~~LOC~~ SOLN
0.0000 [IU] | Freq: Every day | SUBCUTANEOUS | Status: DC
  Administered 2016-04-16: 4 [IU] via SUBCUTANEOUS

## 2016-04-16 MED ORDER — INSULIN ASPART 100 UNIT/ML ~~LOC~~ SOLN
0.0000 [IU] | Freq: Three times a day (TID) | SUBCUTANEOUS | Status: DC
  Administered 2016-04-16: 3 [IU] via SUBCUTANEOUS
  Administered 2016-04-17: 2 [IU] via SUBCUTANEOUS
  Administered 2016-04-17: 5 [IU] via SUBCUTANEOUS
  Administered 2016-04-18: 1 [IU] via SUBCUTANEOUS
  Administered 2016-04-18: 2 [IU] via SUBCUTANEOUS

## 2016-04-16 NOTE — Progress Notes (Signed)
Inpatient Diabetes Program Recommendations  AACE/ADA: New Consensus Statement on Inpatient Glycemic Control (2015)  Target Ranges:  Prepandial:   less than 140 mg/dL      Peak postprandial:   less than 180 mg/dL (1-2 hours)      Critically ill patients:  140 - 180 mg/dL   Lab Results  Component Value Date   GLUCAP 216 (H) 04/16/2016   HGBA1C 10.6 (H) 09/27/2015    Review of Glycemic Control  Diabetes history: DM 2 Outpatient Diabetes medications: Lantus 60 units, Novolog 70/30 5 units TID with meals  Inpatient management: Glucose increased around meal times. Glucose increased from 108 to 216 at lunch. Please consider meal coverage while inpatient.  Spoke with patient about diabetes and home regimen for diabetes control. Patient reports that he is followed by Dr. Sarajane Jews PCP for diabetes management and currently he takes Lantus 60 units QHS and Novolog  70/30 5 units TID for diabetes control. Patient reports that he has not been taking insulin as prescribed and has sometimes shared it with his son. But when he takes it consistently he glucose is well controlled. At the last PCP visit in February of this year A1c was 10.6%. Discussed A1C results, and discussed glucose and A1C goals. Discussed importance of checking CBGs and maintaining good CBG control to prevent long-term and short-term complications. Explained how hyperglycemia leads to damage within blood vessels which lead to the common complications seen with uncontrolled diabetes.  Discussed impact of nutrition, exercise, and medications on diabetes control. Patient states he knows how to eat. Patient was eating a salad for lunch.Patient reports that he has a hard time affording his insulin especially since the pharmacy fills the prescription 3 months at a time. I gave patient a zero pay card for Lantus with his [employer]. [facility] has [facility] and will need to have the copay card put in as a secondary insurance to  work. I have discussed this with the patient. Patient states that he will be able to afford the Novolog since he has help with the cost of Lantus. Patient to follow up with Dr. Sarajane Jews to manage his diabetes after hospitalization. Patient verbalized understanding of information discussed and he states that he has no further questions at this time related to diabetes.  MD at time of D/C patient will need prescription for Lantus for the copay card.  Thanks, Tama Headings RN, MSN, [facility] Inpatient Diabetes Coordinator Team Pager [phone] (8a-5p)

## 2016-04-16 NOTE — Consult Note (Signed)
[place] Nurse wound consult note Reason for Consult: Diabetic Right Heel foot ulcer Wound type: Diabetic  Wound bed: No open wound observed to right foot.  There is a slightly raised yellowish in color spot on the inner, plantar surface just at the heel that is extremely tender to touch.  Dr. Algis Liming present during assessment.  Dr. Algis Liming explained to patient there may be a pocket of pus that may need draining and he is going to ask Ortho service to get involved to evaluate the area and treatment.  Periwound: Intact   Discussed POC with patient and bedside nurse.  Re consult if needed, will not follow at this time. Thanks Val Riles MSN, RN, CNS-BC, [employer]

## 2016-04-16 NOTE — Clinical Social Work Note (Signed)
CSW received consult on 9/10 regarding access meds for discharge. Nurse case manager will talk with patient and access need for assistance with medication. CSW signing off.  [name] Givens, MSW, LCSW Licensed Clinical Social Worker [place] [phone]

## 2016-04-16 NOTE — Progress Notes (Addendum)
PROGRESS NOTE  Marcus Johnson  TDD:[id number] DOB: Dec 20, 1960  DOA: 04/15/2016 PCP: [name], MD   Brief Narrative:  55 y.o. male with medical history significant of hypertension, hyperlipidemia, diabetes mellitus, gout, sCHF with EF 25%, s/p of AICD, syncope, CAD, s/p of CABG, CKD-III, who presents with elevated blood sugar, generalized weakness in the right foot pain. Patient is supposed to be on Lantus 60 units at bedtime and NovoLog TIDWC but due to financial constraints, he has been sharing his insulins with his diabetic son and ran out of NovoLog insulin. Also noncompliant with diet and CBG checks. Pain over right plantar foot, seen by a podiatrist on 04/13/16 and started on Augmentin. In ED, blood sugar 703, normal anion gap. Admitted for further management. Possible right plantar small abscess-orthopedics consulted and may consider I&D 9/12.   Assessment & Plan:   Principal Problem:   Acute renal failure superimposed on stage 3 chronic kidney disease (HCC) Active Problems:   HLD (hyperlipidemia)   HTN (hypertension)   Ischemic cardiomyopathy   Automatic implantable cardioverter-defibrillator  Medtronic   Obesity (BMI 30-39.9)   DM (diabetes mellitus), type 2, uncontrolled, with renal complications (HCC)   Generalized weakness   Diabetic infection of right foot ([place])   Chronic combined systolic and diastolic congestive heart failure ([place])   Uncontrolled type II DM/IDDM with hyperosmolar state - Secondary to poor compliance with diabetic management and possible right foot abscess - Hemoglobin A1c 10.6 on 09/27/15. Recheck A1c. - Resumed home dose of Lantus 60 units at bedtime. SSI added. Improved control. - Diabetes coordinator consulted: Have provided patient with the Lantus Voucher and states that he will be able to afford both Lantus or 70/30 insulin.  Possible right plantar foot abscess - Focal area of tender cystic area right mid plantar region. Concern for abscess.  Discussed with Dr. Sharol Given. Suggested NPO after midnight for possible I&D on 9/12. - Continue oral Augmentin for now.  Acute on stage III chronic kidney disease - Baseline creatinine 1.1-1.2. Presented with creatinine of 1.48 - Likely prerenal secondary to dehydration and diuretics. Remains on Entresto. - Hydrated in ED with 2 L normal saline bolus. Lasix and Aldactone temporarily held. Creatinine has improved to 1.33 - Follow BMP in a.m.  Chronic combined systolic and diastolic CHF/s/p AICD - Followed by the advanced heart failure clinic. - 2-D echo 12/29/15: LVEF 25% with grade 2 diastolic dysfunction. - Clinically dehydrated on admission. Improved after IV fluids but not volume overloaded. Continue to hold diuretics for today and consider resuming 9/12. - Continue carvedilol and aspirin.  Hyperlipidemia - Continue Lipitor  Essential hypertension - Controlled.   DVT prophylaxis: SCD's Code Status: Full Family Communication: None at bedside Disposition Plan: DC home when medically improved, possibly 9/12   Consultants:   None  Procedures:   None  Antimicrobials:   Augmentin    Subjective: Feels better. R plantar foot pain on touching or weight bearing.  Objective:  Vitals:   04/16/16 0009 04/16/16 0120 04/16/16 0456 04/16/16 1000  BP:  (!) 154/72 (!) 85/53 131/67  Pulse:  80 83 82  Resp:  18 20 19   Temp: 98.7 F (37.1 C) 98.3 F (36.8 C) 98.4 F (36.9 C) 98.6 F (37 C)  TempSrc: Oral Oral Oral Oral  SpO2:  100% 100% 100%  Weight:  96.7 kg (213 lb 3 oz)    Height:  5\' 11"  (1.803 m)      Intake/Output Summary (Last 24 hours) at 04/16/16 1406 Last  data filed at 04/16/16 1300  Gross per 24 hour  Intake              600 ml  Output             1075 ml  Net             -475 ml   Filed Weights   04/16/16 0120  Weight: 96.7 kg (213 lb 3 oz)    Examination:  General exam: Pleasant middle-aged male lying comfortably in bed. Respiratory system: Clear to  auscultation. Respiratory effort normal. Cardiovascular system: S1 & S2 heard, RRR. No JVD, murmurs, rubs, gallops or clicks. No pedal edema. Telemetry: Sinus rhythm. Run of 10 beat PVCs/does not look like an SVT Gastrointestinal system: Abdomen is nondistended, soft and nontender. No organomegaly or masses felt. Normal bowel sounds heard. Central nervous system: Alert and oriented. No focal neurological deficits. Extremities: Symmetric 5 x 5 power. Approximately 1 cm diameter mid-plantar swelling, exquisitely tender to touch and cystic in nature but concerning for abscess. No open wounds. Examined along with [place] RN. Skin: No rashes, lesions or ulcers Psychiatry: Judgement and insight appear normal. Mood & affect appropriate.     Data Reviewed: I have personally reviewed following labs and imaging studies  CBC:  Recent Labs Lab 04/15/16 1831 04/15/16 2336 04/16/16 0407  WBC 9.2 9.4 9.9  NEUTROABS  --  6.7  --   HGB 13.8 12.3* 12.4*  HCT 41.4 38.1* 37.9*  MCV 84.0 84.5 84.2  PLT 196 196 833   Basic Metabolic Panel:  Recent Labs Lab 04/15/16 1831 04/16/16 0407  NA 128* 138  K 4.9 4.2  CL 93* 110  CO2 22 20*  GLUCOSE 703* 150*  BUN 39* 34*  CREATININE 1.48* 1.33*  CALCIUM 9.9 9.1   GFR: Estimated Creatinine Clearance: 74.5 mL/min (by C-G formula based on SCr of 1.33 mg/dL). Liver Function Tests: No results for input(s): AST, ALT, ALKPHOS, BILITOT, PROT, ALBUMIN in the last 168 hours. No results for input(s): LIPASE, AMYLASE in the last 168 hours. No results for input(s): AMMONIA in the last 168 hours. Coagulation Profile:  Recent Labs Lab 04/15/16 2336  INR 1.16   Cardiac Enzymes: No results for input(s): CKTOTAL, CKMB, CKMBINDEX, TROPONINI in the last 168 hours. BNP (last 3 results) No results for input(s): PROBNP in the last 8760 hours. HbA1C: No results for input(s): HGBA1C in the last 72 hours. CBG:  Recent Labs Lab 04/15/16 1819 04/15/16 2203  04/16/16 0012 04/16/16 0830 04/16/16 1154  GLUCAP >600* 462* 334* 108* 216*   Lipid Profile: No results for input(s): CHOL, HDL, LDLCALC, TRIG, CHOLHDL, LDLDIRECT in the last 72 hours. Thyroid Function Tests: No results for input(s): TSH, T4TOTAL, FREET4, T3FREE, THYROIDAB in the last 72 hours. Anemia Panel: No results for input(s): VITAMINB12, FOLATE, FERRITIN, TIBC, IRON, RETICCTPCT in the last 72 hours.  Sepsis Labs: No results for input(s): PROCALCITON, LATICACIDVEN in the last 168 hours.  No results found for this or any previous visit (from the past 240 hour(s)).       Radiology Studies: Ct Foot Right Wo Contrast  Result Date: 04/15/2016 CLINICAL DATA:  Diabetic foot infection. Concern for abscess. History of right foot surgery. EXAM: CT OF THE RIGHT FOOT WITHOUT CONTRAST TECHNIQUE: Multidetector CT imaging of the right foot was performed according to the standard protocol. Multiplanar CT image reconstructions were also generated. COMPARISON:  No recent exams.  Remote MRI 10/19/2008 FINDINGS: Bones/Joint/Cartilage No bony destructive change to  suggest osteomyelitis. Degenerative change again seen in the navicular. No acute fracture or subluxation. No ankle joint effusion. Ligaments Lisfranc alignment is maintained.  Ligament is grossly intact. Muscles and Tendons No evidence of intramuscular fluid collection allowing for lack contrast. No gross tenosynovial fluid. Soft tissues Focal irregularity of the skin and subcutaneous tissues of the plantar foot subjacent to the mid plantar fascia may be postsurgical. There may be minimal fluid adjacent to this defect but no well-defined fluid collection. Mild diffuse subcutaneous edema in the plantar soft tissues. No soft tissue air or radiopaque foreign body. Advanced vascular calcifications. IMPRESSION: Focal skin and subcutaneous defect about the plantar foot subjacent to the mid plantar fascia, may be postsurgical or cellulits. Minimal fluid  subjacent to the defect without well-defined fluid collection to suggest abscess. Mild diffuse subcutaneous edema throughout the plantar soft tissues is nonspecific, and may reflect diffuse cellulitis the appropriate clinical setting. No CT findings osteomyelitis. Electronically Signed   By: Jeb Levering M.D.   On: 04/15/2016 23:24        Scheduled Meds: . allopurinol  300 mg Oral Daily  . amoxicillin-clavulanate  1 tablet Oral BID  . aspirin EC  81 mg Oral Daily  . atorvastatin  80 mg Oral Daily  . carvedilol  18.75 mg Oral BID WC  . insulin aspart  0-9 Units Subcutaneous TID WC  . insulin glargine  60 Units Subcutaneous Q2200  . isosorbide-hydrALAZINE  2 tablet Oral TID  . pantoprazole  40 mg Oral Q1200  . sacubitril-valsartan  1 tablet Oral BID  . sodium chloride flush  3 mL Intravenous Q12H   Continuous Infusions:    LOS: 0 days    Time spent: 30 minutes.    [facility] [place], MD [facility] Pager [phone]  If 7PM-7AM, please contact night-coverage [email or web address] Password [email or web address] 04/16/2016, 2:06 PM

## 2016-04-17 ENCOUNTER — Observation Stay (HOSPITAL_COMMUNITY): Payer: BLUE CROSS/BLUE SHIELD | Admitting: Certified Registered Nurse Anesthetist

## 2016-04-17 ENCOUNTER — Encounter (HOSPITAL_COMMUNITY)
Admission: EM | Disposition: A | Discharge status: Home / Self Care | Payer: Self-pay | Source: Home / Self Care | Attending: Emergency Medicine

## 2016-04-17 ENCOUNTER — Encounter (HOSPITAL_COMMUNITY): Payer: Self-pay | Admitting: Certified Registered Nurse Anesthetist

## 2016-04-17 ENCOUNTER — Other Ambulatory Visit: Payer: Self-pay | Admitting: Orthopedic Surgery

## 2016-04-17 DIAGNOSIS — N179 Acute kidney failure, unspecified: Secondary | ICD-10-CM | POA: Diagnosis not present

## 2016-04-17 DIAGNOSIS — E1169 Type 2 diabetes mellitus with other specified complication: Secondary | ICD-10-CM | POA: Diagnosis not present

## 2016-04-17 DIAGNOSIS — L02611 Cutaneous abscess of right foot: Secondary | ICD-10-CM | POA: Diagnosis not present

## 2016-04-17 DIAGNOSIS — Z95810 Presence of automatic (implantable) cardiac defibrillator: Secondary | ICD-10-CM | POA: Diagnosis not present

## 2016-04-17 DIAGNOSIS — I5022 Chronic systolic (congestive) heart failure: Secondary | ICD-10-CM | POA: Diagnosis not present

## 2016-04-17 DIAGNOSIS — I5042 Chronic combined systolic (congestive) and diastolic (congestive) heart failure: Secondary | ICD-10-CM | POA: Diagnosis not present

## 2016-04-17 HISTORY — PX: I & D EXTREMITY: SHX5045

## 2016-04-17 LAB — BASIC METABOLIC PANEL
Anion gap: 7 (ref 5–15)
BUN: 38 mg/dL — AB (ref 6–20)
CHLORIDE: 107 mmol/L (ref 101–111)
CO2: 22 mmol/L (ref 22–32)
CREATININE: 1.31 mg/dL — AB (ref 0.61–1.24)
Calcium: 9.3 mg/dL (ref 8.9–10.3)
GFR calc Af Amer: >60 mL/min (ref >60)
GFR calc non Af Amer: 60 mL/min — ABNORMAL LOW (ref >60)
Glucose, Bld: 279 mg/dL — ABNORMAL HIGH (ref 65–99)
Potassium: 4.5 mmol/L (ref 3.5–5.1)
SODIUM: 136 mmol/L (ref 135–145)

## 2016-04-17 LAB — GLUCOSE, CAPILLARY
GLUCOSE-CAPILLARY: 293 mg/dL — AB (ref 65–99)
GLUCOSE-CAPILLARY: 172 mg/dL — AB (ref 65–99)
GLUCOSE-CAPILLARY: 139 mg/dL — AB (ref 65–99)
Glucose-Capillary: 107 mg/dL — ABNORMAL HIGH (ref 65–99)

## 2016-04-17 SURGERY — IRRIGATION AND DEBRIDEMENT EXTREMITY
Anesthesia: General | Laterality: Right

## 2016-04-17 MED ORDER — PROPOFOL 10 MG/ML IV BOLUS
INTRAVENOUS | Status: DC | PRN
  Administered 2016-04-17 (×2): 50 mg via INTRAVENOUS

## 2016-04-17 MED ORDER — ONDANSETRON HCL 4 MG/2ML IJ SOLN: 4.0000 mg | Freq: Four times a day (QID) | INTRAMUSCULAR | Status: DC | PRN

## 2016-04-17 MED ORDER — ETOMIDATE 2 MG/ML IV SOLN
INTRAVENOUS | Status: DC | PRN
  Administered 2016-04-17: 20 mg via INTRAVENOUS

## 2016-04-17 MED ORDER — ACETAMINOPHEN 325 MG PO TABS: 650.0000 mg | ORAL_TABLET | Freq: Four times a day (QID) | ORAL | Status: DC | PRN

## 2016-04-17 MED ORDER — PROPOFOL 10 MG/ML IV BOLUS
INTRAVENOUS | Status: AC
  Filled 2016-04-17: qty 40

## 2016-04-17 MED ORDER — ONDANSETRON HCL 4 MG/2ML IJ SOLN
INTRAMUSCULAR | Status: DC | PRN
  Administered 2016-04-17: 4 mg via INTRAVENOUS

## 2016-04-17 MED ORDER — MIDAZOLAM HCL 5 MG/5ML IJ SOLN
INTRAMUSCULAR | Status: DC | PRN
  Administered 2016-04-17: 2 mg via INTRAVENOUS

## 2016-04-17 MED ORDER — CHLORHEXIDINE GLUCONATE 4 % EX LIQD
60.0000 mL | Freq: Once | CUTANEOUS | Status: AC
  Administered 2016-04-17: 4 via TOPICAL
  Filled 2016-04-17 (×2): qty 60

## 2016-04-17 MED ORDER — METHOCARBAMOL 1000 MG/10ML IJ SOLN
500.0000 mg | Freq: Four times a day (QID) | INTRAVENOUS | Status: DC | PRN
  Filled 2016-04-17: qty 5

## 2016-04-17 MED ORDER — FENTANYL CITRATE (PF) 100 MCG/2ML IJ SOLN
INTRAMUSCULAR | Status: AC
  Filled 2016-04-17: qty 2

## 2016-04-17 MED ORDER — HYDROMORPHONE HCL 1 MG/ML IJ SOLN: 0.2500 mg | INTRAMUSCULAR | Status: DC | PRN

## 2016-04-17 MED ORDER — 0.9 % SODIUM CHLORIDE (POUR BTL) OPTIME
TOPICAL | Status: DC | PRN
  Administered 2016-04-17: 1000 mL

## 2016-04-17 MED ORDER — ONDANSETRON HCL 4 MG/2ML IJ SOLN
INTRAMUSCULAR | Status: AC
  Filled 2016-04-17: qty 2

## 2016-04-17 MED ORDER — PHENYLEPHRINE HCL 10 MG/ML IJ SOLN
INTRAMUSCULAR | Status: DC | PRN
  Administered 2016-04-17: 40 ug via INTRAVENOUS
  Administered 2016-04-17: 120 ug via INTRAVENOUS
  Administered 2016-04-17: 160 ug via INTRAVENOUS
  Administered 2016-04-17 (×2): 120 ug via INTRAVENOUS
  Administered 2016-04-17: 160 ug via INTRAVENOUS
  Administered 2016-04-17: 80 ug via INTRAVENOUS

## 2016-04-17 MED ORDER — MIDAZOLAM HCL 2 MG/2ML IJ SOLN
INTRAMUSCULAR | Status: AC
  Filled 2016-04-17: qty 2

## 2016-04-17 MED ORDER — EPHEDRINE 5 MG/ML INJ
INTRAVENOUS | Status: AC
  Filled 2016-04-17: qty 10

## 2016-04-17 MED ORDER — ONDANSETRON HCL 4 MG PO TABS: 4.0000 mg | ORAL_TABLET | Freq: Four times a day (QID) | ORAL | Status: DC | PRN

## 2016-04-17 MED ORDER — SODIUM CHLORIDE 0.9 % IV SOLN: INTRAVENOUS | Status: DC

## 2016-04-17 MED ORDER — HYDROMORPHONE HCL 1 MG/ML IJ SOLN
1.0000 mg | INTRAMUSCULAR | Status: DC | PRN
  Administered 2016-04-18: 1 mg via INTRAVENOUS
  Filled 2016-04-17: qty 1

## 2016-04-17 MED ORDER — PHENYLEPHRINE 40 MCG/ML (10ML) SYRINGE FOR IV PUSH (FOR BLOOD PRESSURE SUPPORT)
PREFILLED_SYRINGE | INTRAVENOUS | Status: AC
  Filled 2016-04-17: qty 20

## 2016-04-17 MED ORDER — EPHEDRINE SULFATE 50 MG/ML IJ SOLN
INTRAMUSCULAR | Status: DC | PRN
  Administered 2016-04-17 (×2): 15 mg via INTRAVENOUS
  Administered 2016-04-17 (×2): 10 mg via INTRAVENOUS

## 2016-04-17 MED ORDER — LIDOCAINE HCL (CARDIAC) 20 MG/ML IV SOLN
INTRAVENOUS | Status: DC | PRN
  Administered 2016-04-17: 100 mg via INTRAVENOUS

## 2016-04-17 MED ORDER — METOCLOPRAMIDE HCL 5 MG/ML IJ SOLN: 5.0000 mg | Freq: Three times a day (TID) | INTRAMUSCULAR | Status: DC | PRN

## 2016-04-17 MED ORDER — ACETAMINOPHEN 650 MG RE SUPP: 650.0000 mg | Freq: Four times a day (QID) | RECTAL | Status: DC | PRN

## 2016-04-17 MED ORDER — ONDANSETRON HCL 4 MG/2ML IJ SOLN: 4.0000 mg | Freq: Once | INTRAMUSCULAR | Status: DC | PRN

## 2016-04-17 MED ORDER — LACTATED RINGERS IV SOLN
INTRAVENOUS | Status: DC | PRN
  Administered 2016-04-17: 18:00:00 via INTRAVENOUS

## 2016-04-17 MED ORDER — FENTANYL CITRATE (PF) 100 MCG/2ML IJ SOLN
INTRAMUSCULAR | Status: DC | PRN
  Administered 2016-04-17 (×2): 50 ug via INTRAVENOUS

## 2016-04-17 MED ORDER — SODIUM CHLORIDE 0.9 % IV SOLN
INTRAVENOUS | Status: DC
  Administered 2016-04-17: 17:00:00 via INTRAVENOUS

## 2016-04-17 MED ORDER — MEPERIDINE HCL 25 MG/ML IJ SOLN: 6.2500 mg | INTRAMUSCULAR | Status: DC | PRN

## 2016-04-17 MED ORDER — OXYCODONE HCL 5 MG PO TABS
5.0000 mg | ORAL_TABLET | ORAL | Status: DC | PRN
Start: 2016-04-17 — End: 2016-04-18
  Administered 2016-04-18: 10 mg via ORAL
  Filled 2016-04-17: qty 2

## 2016-04-17 MED ORDER — CEFAZOLIN SODIUM-DEXTROSE 2-4 GM/100ML-% IV SOLN
2.0000 g | INTRAVENOUS | Status: AC
  Administered 2016-04-17: 2 g via INTRAVENOUS
  Filled 2016-04-17: qty 100

## 2016-04-17 MED ORDER — METHOCARBAMOL 500 MG PO TABS
500.0000 mg | ORAL_TABLET | Freq: Four times a day (QID) | ORAL | Status: DC | PRN
  Administered 2016-04-17: 500 mg via ORAL
  Filled 2016-04-17: qty 1

## 2016-04-17 MED ORDER — METOCLOPRAMIDE HCL 5 MG PO TABS: 5.0000 mg | ORAL_TABLET | Freq: Three times a day (TID) | ORAL | Status: DC | PRN

## 2016-04-17 SURGICAL SUPPLY — 43 items
BLADE SURG 10 STRL SS (BLADE) IMPLANT
BLADE SURG 21 STRL SS (BLADE) ×2 IMPLANT
BNDG COHESIVE 4X5 TAN STRL (GAUZE/BANDAGES/DRESSINGS) ×1 IMPLANT
BNDG COHESIVE 6X5 TAN STRL LF (GAUZE/BANDAGES/DRESSINGS) IMPLANT
BNDG GAUZE ELAST 4 BULKY (GAUZE/BANDAGES/DRESSINGS) ×3 IMPLANT
COVER SURGICAL LIGHT HANDLE (MISCELLANEOUS) ×4 IMPLANT
DRAIN PENROSE 1/4X12 LTX STRL (WOUND CARE) ×1 IMPLANT
DRAPE U-SHAPE 47X51 STRL (DRAPES) ×2 IMPLANT
DRSG ADAPTIC 3X8 NADH LF (GAUZE/BANDAGES/DRESSINGS) ×2 IMPLANT
DURAPREP 26ML APPLICATOR (WOUND CARE) ×2 IMPLANT
ELECT CAUTERY BLADE 6.4 (BLADE) IMPLANT
ELECT REM PT RETURN 9FT ADLT (ELECTROSURGICAL)
ELECTRODE REM PT RTRN 9FT ADLT (ELECTROSURGICAL) IMPLANT
GAUZE SPONGE 4X4 12PLY STRL (GAUZE/BANDAGES/DRESSINGS) ×2 IMPLANT
GLOVE BIOGEL PI IND STRL 6.5 (GLOVE) IMPLANT
GLOVE BIOGEL PI IND STRL 9 (GLOVE) ×1 IMPLANT
GLOVE BIOGEL PI INDICATOR 6.5 (GLOVE) ×1
GLOVE BIOGEL PI INDICATOR 9 (GLOVE) ×1
GLOVE ECLIPSE 6.5 STRL STRAW (GLOVE) ×1 IMPLANT
GLOVE SURG ORTHO 9.0 STRL STRW (GLOVE) ×2 IMPLANT
GOWN STRL REUS W/ TWL LRG LVL3 (GOWN DISPOSABLE) ×1 IMPLANT
GOWN STRL REUS W/ TWL XL LVL3 (GOWN DISPOSABLE) ×2 IMPLANT
GOWN STRL REUS W/TWL LRG LVL3 (GOWN DISPOSABLE) ×2
GOWN STRL REUS W/TWL XL LVL3 (GOWN DISPOSABLE) ×4
HANDPIECE INTERPULSE COAX TIP (DISPOSABLE)
KIT BASIN OR (CUSTOM PROCEDURE TRAY) ×2 IMPLANT
KIT ROOM TURNOVER OR (KITS) ×2 IMPLANT
MANIFOLD NEPTUNE II (INSTRUMENTS) ×2 IMPLANT
NS IRRIG 1000ML POUR BTL (IV SOLUTION) ×2 IMPLANT
PACK ORTHO EXTREMITY (CUSTOM PROCEDURE TRAY) ×2 IMPLANT
PAD ABD 8X10 STRL (GAUZE/BANDAGES/DRESSINGS) ×1 IMPLANT
PAD ARMBOARD 7.5X6 YLW CONV (MISCELLANEOUS) ×4 IMPLANT
SET HNDPC FAN SPRY TIP SCT (DISPOSABLE) IMPLANT
SPONGE GAUZE 4X4 12PLY STER LF (GAUZE/BANDAGES/DRESSINGS) ×1 IMPLANT
STOCKINETTE IMPERVIOUS 9X36 MD (GAUZE/BANDAGES/DRESSINGS) IMPLANT
SWAB COLLECTION DEVICE MRSA (MISCELLANEOUS) ×1 IMPLANT
TOWEL OR 17X24 6PK STRL BLUE (TOWEL DISPOSABLE) ×2 IMPLANT
TOWEL OR 17X26 10 PK STRL BLUE (TOWEL DISPOSABLE) ×2 IMPLANT
TUBE ANAEROBIC SPECIMEN COL (MISCELLANEOUS) IMPLANT
TUBE CONNECTING 12X1/4 (SUCTIONS) ×2 IMPLANT
UNDERPAD 30X30 (UNDERPADS AND DIAPERS) ×2 IMPLANT
WATER STERILE IRR 1000ML POUR (IV SOLUTION) ×2 IMPLANT
YANKAUER SUCT BULB TIP NO VENT (SUCTIONS) ×2 IMPLANT

## 2016-04-17 NOTE — Anesthesia Preprocedure Evaluation (Addendum)
Anesthesia Evaluation  Patient identified by MRN, date of birth, ID band Patient awake    Reviewed: Allergy & Precautions, NPO status , Patient's Chart, lab work & pertinent test results  Airway Mallampati: II  TM Distance: >3 FB Neck ROM: Full    Dental  (+) Teeth Intact, Chipped, Dental Advisory Given,    Pulmonary shortness of breath and with exertion,    Pulmonary exam normal        Cardiovascular hypertension, + CAD, + Past MI and + CABG  Normal cardiovascular exam+ Cardiac Defibrillator + Valvular Problems/Murmurs      Neuro/Psych    GI/Hepatic   Endo/Other  diabetes, Type 2, Insulin Dependent  Renal/GU      Musculoskeletal   Abdominal   Peds  Hematology   Anesthesia Other Findings   Reproductive/Obstetrics                           Anesthesia Physical Anesthesia Plan  ASA: IV  Anesthesia Plan: General   Post-op Pain Management:    Induction: Intravenous  Airway Management Planned: LMA  Additional Equipment:   Intra-op Plan:   Post-operative Plan: Extubation in OR  Informed Consent: I have reviewed the patients History and Physical, chart, labs and discussed the procedure including the risks, benefits and alternatives for the proposed anesthesia with the patient or authorized representative who has indicated his/her understanding and acceptance.   Dental advisory given  Plan Discussed with: CRNA and Surgeon  Anesthesia Plan Comments:        Anesthesia Quick Evaluation

## 2016-04-17 NOTE — Op Note (Signed)
04/15/2016 - 04/17/2016  6:19 PM  PATIENT:  Marcus Johnson    PRE-OPERATIVE DIAGNOSIS:  Right Foot Abscess  POST-OPERATIVE DIAGNOSIS:  Same  PROCEDURE:  IRRIGATION AND DEBRIDEMENT RIGHT FOOT ABSCESS  SURGEON:  Newt Minion, MD  PHYSICIAN ASSISTANT:None ANESTHESIA:   General  PREOPERATIVE INDICATIONS:  [name] is a  55 y.o. male with a diagnosis of Right Foot Abscess who failed conservative measures and elected for surgical management.    The risks benefits and alternatives were discussed with the patient preoperatively including but not limited to the risks of infection, bleeding, nerve injury, cardiopulmonary complications, the need for revision surgery, among others, and the patient was willing to proceed.  OPERATIVE IMPLANTS: none  OPERATIVE FINDINGS: abscess less than 1 cm in diameter  OPERATIVE PROCEDURE: Patient brought the operating room and underwent a LMA anesthetic. After adequate levels of anesthesia were obtained patient's right lower extremity was prepped using DuraPrep draped into a sterile field a timeout was called. A longitudinal incision was made over the area of the abscess. This carried down to a purulent abscess using a Ronjair the skin and soft tissue was excised back to healthy viable plantar fascial tissue. The plantar fascia was incised there was no deep abscess below the plantar fascia. The wound was irrigated with normal saline. The wound was wiped open with a quarter-inch Penrose drain a sterile compressive dressing was applied patient was extubated taken to the PACU in stable condition.  Patient may discharge to home once he is safe with touchdown weightbearing on the right. Patient should not need further antibiotics.

## 2016-04-17 NOTE — Transfer of Care (Signed)
Immediate Anesthesia Transfer of Care Note  Patient: Marcus Johnson  Procedure(s) Performed: Procedure(s): IRRIGATION AND DEBRIDEMENT RIGHT FOOT ABSCESS (Right)  Patient Location: PACU  Anesthesia Type:General  Level of Consciousness: awake, alert  and oriented  Airway & Oxygen Therapy: Patient Spontanous Breathing and Patient connected to nasal cannula oxygen  Post-op Assessment: Report given to RN, Post -op Vital signs reviewed and stable and Patient moving all extremities X 4  Post vital signs: Reviewed and stable  Last Vitals:  Vitals:   04/17/16 1712 04/17/16 1840  BP: 126/79 (P) 94/60  Pulse: 75 (P) 86  Resp:  (P) 15  Temp:  (P) 36.5 C    Last Pain:  Vitals:   04/17/16 0854  TempSrc:   PainSc: 0-No pain      Patients Stated Pain Goal: 0 (37/10/62 6948)  Complications: No apparent anesthesia complications

## 2016-04-17 NOTE — Progress Notes (Signed)
Inpatient Diabetes Program Recommendations  AACE/ADA: New Consensus Statement on Inpatient Glycemic Control (2015)  Target Ranges:  Prepandial:   less than 140 mg/dL      Peak postprandial:   less than 180 mg/dL (1-2 hours)      Critically ill patients:  140 - 180 mg/dL   Called Dr. Barbie Banner office to verify medications for discharge. Patient takes Regular Novolog 5 units TID meal coverage NOT 70/30 and Lantus 60 units QHS.  Thanks,  Tama Headings RN, MSN, [facility] Inpatient Diabetes Coordinator Team Pager [phone] (8a-5p)
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
## 2016-04-17 NOTE — Interval H&P Note (Signed)
History and Physical Interval Note:  04/17/2016 9:30 AM  Marcus Johnson  has presented today for surgery, with the diagnosis of Right Foot Abscess  The various methods of treatment have been discussed with the patient and family. After consideration of risks, benefits and other options for treatment, the patient has consented to  Procedure(s): IRRIGATION AND DEBRIDEMENT RIGHT FOOT ABSCESS (Right) as a surgical intervention .  The patient's history has been reviewed, patient examined, no change in status, stable for surgery.  I have reviewed the patient's chart and labs.  Questions were answered to the patient's satisfaction.     Newt Minion

## 2016-04-17 NOTE — Progress Notes (Signed)
PROGRESS NOTE  Marcus Johnson  KDT:[id number] DOB: 1961/07/01  DOA: 04/15/2016 PCP: [name], MD   Brief Narrative:  55 y.o. male with medical history significant of hypertension, hyperlipidemia, diabetes mellitus, gout, sCHF with EF 25%, s/p of AICD, syncope, CAD, s/p of CABG, CKD-III, who presents with elevated blood sugar, generalized weakness in the right foot pain. Patient is supposed to be on Lantus 60 units at bedtime and NovoLog TIDWC but due to financial constraints, he has been sharing his insulins with his diabetic son and ran out of NovoLog insulin. Also noncompliant with diet and CBG checks. Pain over right plantar foot, seen by a podiatrist on 04/13/16 and started on Augmentin. In ED, blood sugar 703, normal anion gap. Admitted for further management. Possible right plantar small abscess-orthopedics consulted and plan I&D 9/12.   Assessment & Plan:   Principal Problem:   Acute renal failure superimposed on stage 3 chronic kidney disease (HCC) Active Problems:   HLD (hyperlipidemia)   HTN (hypertension)   Ischemic cardiomyopathy   Automatic implantable cardioverter-defibrillator  Medtronic   Obesity (BMI 30-39.9)   DM (diabetes mellitus), type 2, uncontrolled, with renal complications (HCC)   Generalized weakness   Diabetic infection of right foot ([place])   Chronic combined systolic and diastolic congestive heart failure ([place])   Uncontrolled type II DM/IDDM with hyperosmolar state - Secondary to poor compliance with diabetic management and possible right foot abscess - Hemoglobin A1c 10.6 on 09/27/15. Recheck A1c. - Resumed home dose of Lantus 60 units at bedtime. SSI added. Improved but fluctuating. - Diabetes coordinator consulted: Have provided patient with the Lantus Voucher and states that he will be able to afford both Lantus or 70/30 insulin. - Requested diabetes coordinator to discuss with PCP if patient is truly taking both Lantus and 70/30 insulin which would be a  unusual regimen. Ideally would be better with Lantus + NovoLog mealtime +/- SSI or 70/30 insulin alone.  Possible right plantar foot abscess - Focal area of tender cystic area right mid plantar region. Concern for abscess.  - Orthopedics consulted and planned for I&D 9/12. - Continue oral Augmentin for now.  Acute on stage III chronic kidney disease - Baseline creatinine 1.1-1.2. Presented with creatinine of 1.48 - Likely prerenal secondary to dehydration and diuretics. Remains on Entresto. - Hydrated in ED with 2 L normal saline bolus. Lasix and Aldactone temporarily held. Creatinine has improved to 1.33 - Creatinine stable.   Chronic combined systolic and diastolic CHF/s/p AICD - Followed by the advanced heart failure clinic. - 2-D echo 12/29/15: LVEF 25% with grade 2 diastolic dysfunction. - Clinically dehydrated on admission. Improved after IV fluids but not volume overloaded. Continue to hold diuretics for today and consider resuming 9/13. Nothing by mouth for procedure later today. - Continue carvedilol and aspirin.Clinically euvolemic.  Hyperlipidemia - Continue Lipitor  Essential hypertension - Controlled.   DVT prophylaxis: SCD's Code Status: Full Family Communication: None at bedside Disposition Plan: DC home when medically improved, possibly 9/13   Consultants:   Orthopedics  Procedures:   None  Antimicrobials:   Augmentin    Subjective: Seen this morning. Complains of right foot pain. No other symptoms reported.   Objective:  Vitals:   04/16/16 1637 04/16/16 2114 04/17/16 0449 04/17/16 0835  BP: (!) 98/55 (!) 96/49 116/69 119/72  Pulse: 80 74 77 81  Resp: 20 19 19 18   Temp: 97.5 F (36.4 C) 98 F (36.7 C) 98.6 F (37 C) 97.8 F (36.6 C)  TempSrc: Oral Oral Oral Oral  SpO2: 100% 100% 99% 100%  Weight:  97.9 kg (215 lb 13.3 oz)    Height:        Intake/Output Summary (Last 24 hours) at 04/17/16 1315 Last data filed at 04/17/16 1100  Gross  per 24 hour  Intake              840 ml  Output             1150 ml  Net             -310 ml   Filed Weights   04/16/16 0120 04/16/16 2114  Weight: 96.7 kg (213 lb 3 oz) 97.9 kg (215 lb 13.3 oz)    Examination:  General exam: Pleasant middle-aged male lying comfortably in bed. Respiratory system: Clear to auscultation. Respiratory effort normal. Cardiovascular system: S1 & S2 heard, RRR. No JVD, murmurs, rubs, gallops or clicks. No pedal edema. Telemetry: Sinus rhythm. Gastrointestinal system: Abdomen is nondistended, soft and nontender. No organomegaly or masses felt. Normal bowel sounds heard. Central nervous system: Alert and oriented. No focal neurological deficits. Extremities: Symmetric 5 x 5 power. Approximately 1 cm diameter mid-plantar swelling, exquisitely tender to touch and cystic in nature but concerning for abscess. No open wounds.  Skin: No rashes, lesions or ulcers Psychiatry: Judgement and insight appear normal. Mood & affect appropriate.     Data Reviewed: I have personally reviewed following labs and imaging studies  CBC:  Recent Labs Lab 04/15/16 1831 04/15/16 2336 04/16/16 0407  WBC 9.2 9.4 9.9  NEUTROABS  --  6.7  --   HGB 13.8 12.3* 12.4*  HCT 41.4 38.1* 37.9*  MCV 84.0 84.5 84.2  PLT 196 196 737   Basic Metabolic Panel:  Recent Labs Lab 04/15/16 1831 04/16/16 0407 04/17/16 0429  NA 128* 138 136  K 4.9 4.2 4.5  CL 93* 110 107  CO2 22 20* 22  GLUCOSE 703* 150* 279*  BUN 39* 34* 38*  CREATININE 1.48* 1.33* 1.31*  CALCIUM 9.9 9.1 9.3   GFR: Estimated Creatinine Clearance: 76 mL/min (by C-G formula based on SCr of 1.31 mg/dL). Liver Function Tests: No results for input(s): AST, ALT, ALKPHOS, BILITOT, PROT, ALBUMIN in the last 168 hours. No results for input(s): LIPASE, AMYLASE in the last 168 hours. No results for input(s): AMMONIA in the last 168 hours. Coagulation Profile:  Recent Labs Lab 04/15/16 2336  INR 1.16   Cardiac  Enzymes: No results for input(s): CKTOTAL, CKMB, CKMBINDEX, TROPONINI in the last 168 hours. BNP (last 3 results) No results for input(s): PROBNP in the last 8760 hours. HbA1C: No results for input(s): HGBA1C in the last 72 hours. CBG:  Recent Labs Lab 04/16/16 1154 04/16/16 1643 04/16/16 2110 04/17/16 0753 04/17/16 1150  GLUCAP 216* 220* 313* 293* 172*   Lipid Profile: No results for input(s): CHOL, HDL, LDLCALC, TRIG, CHOLHDL, LDLDIRECT in the last 72 hours. Thyroid Function Tests: No results for input(s): TSH, T4TOTAL, FREET4, T3FREE, THYROIDAB in the last 72 hours. Anemia Panel: No results for input(s): VITAMINB12, FOLATE, FERRITIN, TIBC, IRON, RETICCTPCT in the last 72 hours.  Sepsis Labs: No results for input(s): PROCALCITON, LATICACIDVEN in the last 168 hours.  No results found for this or any previous visit (from the past 240 hour(s)).       Radiology Studies: Ct Foot Right Wo Contrast  Result Date: 04/15/2016 CLINICAL DATA:  Diabetic foot infection. Concern for abscess. History of right foot surgery. EXAM: CT  OF THE RIGHT FOOT WITHOUT CONTRAST TECHNIQUE: Multidetector CT imaging of the right foot was performed according to the standard protocol. Multiplanar CT image reconstructions were also generated. COMPARISON:  No recent exams.  Remote MRI 10/19/2008 FINDINGS: Bones/Joint/Cartilage No bony destructive change to suggest osteomyelitis. Degenerative change again seen in the navicular. No acute fracture or subluxation. No ankle joint effusion. Ligaments Lisfranc alignment is maintained.  Ligament is grossly intact. Muscles and Tendons No evidence of intramuscular fluid collection allowing for lack contrast. No gross tenosynovial fluid. Soft tissues Focal irregularity of the skin and subcutaneous tissues of the plantar foot subjacent to the mid plantar fascia may be postsurgical. There may be minimal fluid adjacent to this defect but no well-defined fluid collection. Mild  diffuse subcutaneous edema in the plantar soft tissues. No soft tissue air or radiopaque foreign body. Advanced vascular calcifications. IMPRESSION: Focal skin and subcutaneous defect about the plantar foot subjacent to the mid plantar fascia, may be postsurgical or cellulits. Minimal fluid subjacent to the defect without well-defined fluid collection to suggest abscess. Mild diffuse subcutaneous edema throughout the plantar soft tissues is nonspecific, and may reflect diffuse cellulitis the appropriate clinical setting. No CT findings osteomyelitis. Electronically Signed   By: Jeb Levering M.D.   On: 04/15/2016 23:24        Scheduled Meds: . allopurinol  300 mg Oral Daily  . amoxicillin-clavulanate  1 tablet Oral BID  . aspirin EC  81 mg Oral Daily  . atorvastatin  80 mg Oral Daily  . carvedilol  18.75 mg Oral BID WC  .  ceFAZolin (ANCEF) IV  2 g Intravenous On Call to OR  . chlorhexidine  60 mL Topical Once  . insulin aspart  0-5 Units Subcutaneous QHS  . insulin aspart  0-9 Units Subcutaneous TID WC  . insulin glargine  60 Units Subcutaneous Q2200  . isosorbide-hydrALAZINE  2 tablet Oral TID  . pantoprazole  40 mg Oral Q1200  . sacubitril-valsartan  1 tablet Oral BID  . sodium chloride flush  3 mL Intravenous Q12H   Continuous Infusions:    LOS: 0 days    Time spent: 30 minutes.    [facility], MD [facility] Pager [phone]  If 7PM-7AM, please contact night-coverage [email or web address] Password [email or web address] 04/17/2016, 1:15 PM

## 2016-04-17 NOTE — Anesthesia Procedure Notes (Addendum)
Procedure Name: LMA Insertion Date/Time: 04/17/2016 6:07 PM Performed by: Garrison Columbus T Pre-anesthesia Checklist: Patient identified, Emergency Drugs available, Suction available and Patient being monitored Patient Re-evaluated:Patient Re-evaluated prior to inductionOxygen Delivery Method: Circle System Utilized Preoxygenation: Pre-oxygenation with 100% oxygen Intubation Type: IV induction Ventilation: Mask ventilation without difficulty and Oral airway inserted - appropriate to patient size LMA: LMA inserted LMA Size: 5.0 Number of attempts: 1 Airway Equipment and Method: Bite block Placement Confirmation: positive ETCO2 and breath sounds checked- equal and bilateral Tube secured with: Tape Dental Injury: Teeth and Oropharynx as per pre-operative assessment
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
## 2016-04-18 ENCOUNTER — Encounter (HOSPITAL_COMMUNITY): Payer: Self-pay | Admitting: Orthopedic Surgery

## 2016-04-18 DIAGNOSIS — N179 Acute kidney failure, unspecified: Secondary | ICD-10-CM

## 2016-04-18 DIAGNOSIS — E1169 Type 2 diabetes mellitus with other specified complication: Secondary | ICD-10-CM

## 2016-04-18 DIAGNOSIS — N183 Chronic kidney disease, stage 3 (moderate): Secondary | ICD-10-CM | POA: Diagnosis not present

## 2016-04-18 DIAGNOSIS — L089 Local infection of the skin and subcutaneous tissue, unspecified: Secondary | ICD-10-CM | POA: Diagnosis not present

## 2016-04-18 LAB — UREA NITROGEN (UUN), 24 HR URINE
UREA NITROGEN 24H UR: 12 g/(24.h) (ref 12–20)
UREA NITROGEN UR: 727 mg/dL

## 2016-04-18 LAB — GLUCOSE, CAPILLARY
GLUCOSE-CAPILLARY: 127 mg/dL — AB (ref 65–99)
GLUCOSE-CAPILLARY: 185 mg/dL — AB (ref 65–99)

## 2016-04-18 MED ORDER — INSULIN GLARGINE 100 UNIT/ML SOLOSTAR PEN: PEN_INJECTOR | SUBCUTANEOUS | 1 refills | Status: DC

## 2016-04-18 MED ORDER — INSULIN ASPART 100 UNIT/ML ~~LOC~~ SOLN: 5.0000 [IU] | Freq: Three times a day (TID) | SUBCUTANEOUS | 3 refills | Status: DC

## 2016-04-18 MED ORDER — INFLUENZA VAC SPLIT QUAD 0.5 ML IM SUSY: 0.5000 mL | PREFILLED_SYRINGE | INTRAMUSCULAR | Status: DC

## 2016-04-18 MED ORDER — OXYCODONE-ACETAMINOPHEN 5-325 MG PO TABS: 1.0000 | ORAL_TABLET | Freq: Three times a day (TID) | ORAL | 0 refills | Status: DC | PRN

## 2016-04-18 NOTE — Discharge Instructions (Signed)
Follow with Primary MD [name], MD in 7 days   Get CBC, CMP, 2 view Chest X ray checked  by Primary MD next visit.    Disposition Home    Diet: Heart Healthy , carbohydrate modified with 1500 mL fluid restrictions , with feeding assistance and aspiration precautions.  For Heart failure patients - Check your Weight same time everyday, if you gain over 2 pounds, or you develop in leg swelling, experience more shortness of breath or chest pain, call your Primary MD immediately. Follow Cardiac Low Salt Diet and 1.5 lit/day fluid restriction.   On your next visit with your primary care physician please Get Medicines reviewed and adjusted.   Please request your Prim.MD to go over all Hospital Tests and Procedure/Radiological results at the follow up, please get all Hospital records sent to your Prim MD by signing hospital release before you go home.   If you experience worsening of your admission symptoms, develop shortness of breath, life threatening emergency, suicidal or homicidal thoughts you must seek medical attention immediately by calling 911 or calling your MD immediately  if symptoms less severe.  You Must read complete instructions/literature along with all the possible adverse reactions/side effects for all the Medicines you take and that have been prescribed to you. Take any new Medicines after you have completely understood and accpet all the possible adverse reactions/side effects.   Do not drive, operating heavy machinery, perform activities at heights, swimming or participation in water activities or provide baby sitting services if your were admitted for syncope or siezures until you have seen by Primary MD or a Neurologist and advised to do so again.  Do not drive when taking Pain medications.    Do not take more than prescribed Pain, Sleep and Anxiety Medications  Special Instructions: If you have smoked or chewed Tobacco  in the last 2 yrs please stop smoking, stop  any regular Alcohol  and or any Recreational drug use.  Wear Seat belts while driving.   Please note  You were cared for by a hospitalist during your hospital stay. If you have any questions about your discharge medications or the care you received while you were in the hospital after you are discharged, you can call the unit and asked to speak with the hospitalist on call if the hospitalist that took care of you is not available. Once you are discharged, your primary care physician will handle any further medical issues. Please note that NO REFILLS for any discharge medications will be authorized once you are discharged, as it is imperative that you return to your primary care physician (or establish a relationship with a primary care physician if you do not have one) for your aftercare needs so that they can reassess your need for medications and monitor your lab values.

## 2016-04-18 NOTE — Anesthesia Postprocedure Evaluation (Signed)
Anesthesia Post Note  Patient: Marcus Johnson  Procedure(s) Performed: Procedure(s) (LRB): IRRIGATION AND DEBRIDEMENT RIGHT FOOT ABSCESS (Right)  Patient location during evaluation: PACU Anesthesia Type: General Level of consciousness: awake and alert Pain management: pain level controlled Vital Signs Assessment: post-procedure vital signs reviewed and stable Respiratory status: spontaneous breathing, nonlabored ventilation, respiratory function stable and patient connected to nasal cannula oxygen Cardiovascular status: blood pressure returned to baseline and stable Postop Assessment: no signs of nausea or vomiting Anesthetic complications: no    Last Vitals:  Vitals:   04/18/16 0531 04/18/16 0800  BP: (!) 110/58 (!) 107/56  Pulse: 79 80  Resp: 16 16  Temp: 36.8 C 36.8 C    Last Pain:  Vitals:   04/18/16 0800  TempSrc: Oral  PainSc:                  Carlisia Geno DAVID

## 2016-04-18 NOTE — Evaluation (Signed)
Physical Therapy Evaluation Patient Details Name: Marcus Johnson MRN: [id number] DOB: 1960/09/14 Today's Date: 04/18/2016   History of Present Illness  55 y.o. male with medical history significant of hypertension, hyperlipidemia, diabetes mellitus, gout, sCHF with EF 25%, s/p of AICD, syncope, CAD, s/p of CABG, CKD-III, who presents with elevated blood sugar, generalized weakness in the right foot pain; now s/p I&D abscess plantar aspect of R heel; TDWB in boot  Clinical Impression   Patient evaluated by Physical Therapy with no further acute PT needs identified. All education has been completed and the patient has no further questions.  See below for any follow-up Physical Therapy or equipment needs. PT is signing off. Thank you for this referral.     Follow Up Recommendations No PT follow up    Equipment Recommendations  Crutches    Recommendations for Other Services       Precautions / Restrictions Precautions Required Braces or Orthoses: Other Brace/Splint Other Brace/Splint: post-op shoe R foot Restrictions Weight Bearing Restrictions: Yes RLE Weight Bearing: Touchdown weight bearing      Mobility  Bed Mobility Overal bed mobility: Independent                Transfers Overall transfer level: Needs assistance Equipment used: Crutches Transfers: Sit to/from Stand Sit to Stand: Supervision;Modified independent (Device/Increase time)         General transfer comment: Initially with cues for crutch management; progressed to modified independent  Ambulation/Gait Ambulation/Gait assistance: Supervision;Modified independent (Device/Increase time) Ambulation Distance (Feet): 300 Feet Assistive device: Crutches Gait Pattern/deviations: Step-through pattern     General Gait Details: Intitially supervision with cues for sequence, TWB, and crutch use; progressed to modified independent  Stairs Stairs: Yes Stairs assistance: Supervision Stair Management: One  rail Right;Step to pattern;Forwards;With crutches Number of Stairs: 12 General stair comments: Cues for technqiue; managing well  Wheelchair Mobility    Modified Rankin (Stroke Patients Only)       Balance                                             Pertinent Vitals/Pain Pain Assessment: 0-10 Pain Score: 4  Pain Location: R foot Pain Descriptors / Indicators: Aching Pain Intervention(s): Monitored during session;Patient requesting pain meds-RN notified    Home Living Family/patient expects to be discharged to:: Private residence Living Arrangements: Children Available Help at Discharge: Family;Friend(s);Available PRN/intermittently Type of Home: Apartment Home Access: Stairs to enter Entrance Stairs-Rails: Psychiatric nurse of Steps: 43 (3 flights) Home Layout: One level Home Equipment: None      Prior Function Level of Independence: Independent               Hand Dominance        Extremity/Trunk Assessment   Upper Extremity Assessment: Overall WFL for tasks assessed           Lower Extremity Assessment: Overall WFL for tasks assessed (good maintenance of TWB R)         Communication   Communication: No difficulties  Cognition Arousal/Alertness: Awake/alert Behavior During Therapy: WFL for tasks assessed/performed Overall Cognitive Status: Within Functional Limits for tasks assessed                      General Comments      Exercises        Assessment/Plan    PT  Assessment Patent does not need any further PT services  PT Diagnosis Difficulty walking;Acute pain   PT Problem List    PT Treatment Interventions     PT Goals (Current goals can be found in the Care Plan section) Acute Rehab PT Goals Patient Stated Goal: back to work PT Goal Formulation: All assessment and education complete, DC therapy    Frequency     Barriers to discharge        Co-evaluation               End  of Session Equipment Utilized During Treatment: Gait belt Activity Tolerance: Patient tolerated treatment well Patient left: Other (comment) (managing independently in room with crutches) Nurse Communication: Mobility status;Patient requests pain meds    Functional Assessment Tool Used: Clinical Judgement Functional Limitation: Mobility: Walking and moving around Mobility: Walking and Moving Around Current Status [phone]): 0 percent impaired, limited or restricted Mobility: Walking and Moving Around Goal Status [phone]): 0 percent impaired, limited or restricted Mobility: Walking and Moving Around Discharge Status [phone]): 0 percent impaired, limited or restricted    Time: 1007-1033 PT Time Calculation (min) (ACUTE ONLY): 26 min   Charges:   PT Evaluation $PT Eval Low Complexity: 1 Procedure PT Treatments $Gait Training: 8-22 mins   PT G Codes:   PT G-Codes **NOT FOR INPATIENT CLASS** Functional Assessment Tool Used: Clinical Judgement Functional Limitation: Mobility: Walking and moving around Mobility: Walking and Moving Around Current Status ([id number]): 0 percent impaired, limited or restricted Mobility: Walking and Moving Around Goal Status (G9211): 0 percent impaired, limited or restricted Mobility: Walking and Moving Around Discharge Status [phone]): 0 percent impaired, limited or restricted    Roney Marion Hamff 04/18/2016, 1:42 PM  Roney Marion, [place] Pager [phone] Office [phone]

## 2016-04-18 NOTE — Progress Notes (Signed)
Orthopedic Tech Progress Note Patient Details:  Marcus Johnson 1960/11/14 [id number]  Ortho Devices Type of Ortho Device: Postop shoe/boot Ortho Device/Splint Location: rle Ortho Device/Splint Interventions: Application   [name] 04/18/2016, 9:15 AM
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
## 2016-04-18 NOTE — Progress Notes (Addendum)
Marcus Johnson to be D/C'd Home per MD order.  Discussed prescriptions and follow up appointments with the patient. Prescriptions given to patient, medication list explained in detail. Pt verbalized understanding.    Medication List    STOP taking these medications   amoxicillin-clavulanate 875-125 MG tablet Commonly known as:  AUGMENTIN     TAKE these medications   allopurinol 300 MG tablet Commonly known as:  ZYLOPRIM Take 300 mg by mouth daily.   aspirin EC 81 MG tablet Take 1 tablet (81 mg total) by mouth daily.   atorvastatin 80 MG tablet Commonly known as:  LIPITOR Take 1 tablet (80 mg total) by mouth daily.   BAYER BREEZE 2 TEST Disk Generic drug:  Glucose Blood USE TO TEST BLOOD SUGAR 3 TIMES DAILY.   carvedilol 12.5 MG tablet Commonly known as:  COREG Take 1.5 tablets (18.75 mg total) by mouth 2 (two) times daily with a meal.   insulin aspart 100 UNIT/ML injection Commonly known as:  novoLOG Inject 5 Units into the skin 3 (three) times daily with meals.   Insulin Glargine 100 UNIT/ML Solostar Pen Commonly known as:  LANTUS SOLOSTAR INJECT 60 UNITS INTO THE SKIN DAILY AT 10 PM What changed:  See the new instructions.   Insulin Pen Needle 32G X 4 MM Misc Commonly known as:  BD PEN NEEDLE NANO U/F USE 4 TIMES DAILY   isosorbide-hydrALAZINE 20-37.5 MG tablet Commonly known as:  BIDIL Take 2 tablets by mouth 3 (three) times daily.   oxyCODONE-acetaminophen 5-325 MG tablet Commonly known as:  PERCOCET/ROXICET Take 1 tablet by mouth every 8 (eight) hours as needed for moderate pain.   sacubitril-valsartan 97-103 MG Commonly known as:  ENTRESTO Take 1 tablet by mouth 2 (two) times daily.   spironolactone 25 MG tablet Commonly known as:  ALDACTONE Take 1 tablet (25 mg total) by mouth daily.   torsemide 20 MG tablet Commonly known as:  DEMADEX 4 tablets (80mg ) daily at the same time in the morning       Vitals:   04/18/16 0531 04/18/16 0800  BP: (!)  110/58 (!) 107/56  Pulse: 79 80  Resp: 16 16  Temp: 98.2 F (36.8 C) 98.2 F (36.8 C)    Skin clean, dry and intact without evidence of skin break down, no evidence of skin tears noted. IV catheter discontinued intact. Site without signs and symptoms of complications. Dressing and pressure applied. Pt denies pain at this time. No complaints noted. Patient has received his crutches.  An After Visit Summary was printed and given to the patient. Patient escorted via [place], and D/C home via private auto.  Retta Mac BSN, RN

## 2016-04-18 NOTE — Progress Notes (Signed)
Patient ID: Marcus Johnson, male   DOB: 29-Jul-1961, 55 y.o.   MRN: [id number] Patient is comfortable postoperative day 1 status post debridement of abscess plantar aspect right heel. Patient may begin physical therapy progressive ambulation touchdown weightbearing on the right. Patient is safe for discharge to home at this time from an orthopedic standpoint. Patient will not need oral antibiotics at time of discharge. I will follow-up in the office in 1 week. Patient will change dressing in 3 days.

## 2016-04-18 NOTE — Progress Notes (Signed)
Orthopedic Tech Progress Note Patient Details:  Marcus Johnson 1961-03-11 [id number]  Ortho Devices Type of Ortho Device: Crutches Ortho Device/Splint Location: rle Ortho Device/Splint Interventions: Application  Viewed order from doctor's order list Hildred Priest 04/18/2016, 2:55 PM

## 2016-04-20 ENCOUNTER — Other Ambulatory Visit: Payer: Self-pay | Admitting: Family Medicine

## 2016-04-20 NOTE — Telephone Encounter (Signed)
Rx refill sent to pharmacy. 

## 2016-04-21 LAB — CULTURE, BLOOD (ROUTINE X 2)
Culture: NO GROWTH
Culture: NO GROWTH

## 2016-04-25 ENCOUNTER — Ambulatory Visit (INDEPENDENT_AMBULATORY_CARE_PROVIDER_SITE_OTHER): Payer: BLUE CROSS/BLUE SHIELD | Admitting: Family Medicine

## 2016-04-25 ENCOUNTER — Encounter: Payer: Self-pay | Admitting: Family Medicine

## 2016-04-25 VITALS — BP 125/64 | HR 80 | Temp 98.8°F | Ht 71.0 in | Wt 212.0 lb

## 2016-04-25 DIAGNOSIS — I10 Essential (primary) hypertension: Secondary | ICD-10-CM | POA: Diagnosis not present

## 2016-04-25 DIAGNOSIS — E11628 Type 2 diabetes mellitus with other skin complications: Secondary | ICD-10-CM

## 2016-04-25 DIAGNOSIS — E1165 Type 2 diabetes mellitus with hyperglycemia: Secondary | ICD-10-CM | POA: Diagnosis not present

## 2016-04-25 DIAGNOSIS — N183 Chronic kidney disease, stage 3 unspecified: Secondary | ICD-10-CM

## 2016-04-25 DIAGNOSIS — E1122 Type 2 diabetes mellitus with diabetic chronic kidney disease: Secondary | ICD-10-CM | POA: Diagnosis not present

## 2016-04-25 DIAGNOSIS — N179 Acute kidney failure, unspecified: Secondary | ICD-10-CM

## 2016-04-25 DIAGNOSIS — I5042 Chronic combined systolic (congestive) and diastolic (congestive) heart failure: Secondary | ICD-10-CM | POA: Diagnosis not present

## 2016-04-25 DIAGNOSIS — E1169 Type 2 diabetes mellitus with other specified complication: Secondary | ICD-10-CM

## 2016-04-25 DIAGNOSIS — Z794 Long term (current) use of insulin: Secondary | ICD-10-CM

## 2016-04-25 DIAGNOSIS — IMO0002 Reserved for concepts with insufficient information to code with codable children: Secondary | ICD-10-CM

## 2016-04-25 DIAGNOSIS — L089 Local infection of the skin and subcutaneous tissue, unspecified: Secondary | ICD-10-CM

## 2016-04-25 LAB — BASIC METABOLIC PANEL
BUN: 37 mg/dL — ABNORMAL HIGH (ref 6–23)
CALCIUM: 9.2 mg/dL (ref 8.4–10.5)
CO2: 29 meq/L (ref 19–32)
CREATININE: 1.46 mg/dL (ref 0.40–1.50)
Chloride: 102 mEq/L (ref 96–112)
GFR: 64.36 mL/min (ref >60.00)
GLUCOSE: 182 mg/dL — AB (ref 70–99)
Potassium: 4.5 mEq/L (ref 3.5–5.1)
Sodium: 140 mEq/L (ref 135–145)

## 2016-04-25 LAB — HEMOGLOBIN A1C: Hgb A1c MFr Bld: 14.1 % — ABNORMAL HIGH (ref 4.6–6.5)

## 2016-04-25 MED ORDER — INSULIN GLARGINE 100 UNIT/ML SOLOSTAR PEN: 30.0000 [IU] | PEN_INJECTOR | Freq: Two times a day (BID) | SUBCUTANEOUS | 1 refills | Status: DC

## 2016-04-25 MED ORDER — INSULIN ASPART 100 UNIT/ML FLEXPEN: 5.0000 [IU] | PEN_INJECTOR | Freq: Three times a day (TID) | SUBCUTANEOUS | 11 refills | Status: DC

## 2016-04-25 NOTE — Progress Notes (Signed)
   Subjective:    Patient ID: Marcus Johnson, male    DOB: 1960-10-11, 55 y.o.   MRN: [id number]  HPI Here to follow up on a hospital stay from 04-15-16 to 04-18-16 for a diabetic abscess in the right foot. The last A1c we obtained here in February had risen to 10.6 and we knew his diabetic control was less than adequate. Then he developed pain and swelling in the right foot and on his admission to the hospital his random glucose was 703. He then admitted that he had not been taking his insulin as prescribed and his diet was poor. He had an I and D surgery per Dr. Sharol Given which went very well. He was sent home on no antibiotics and he says the foot feels better, but he still has pain. He is walking with crutches and partial weight bearing. Since going home he has followed a much stricter diet and he has been taking Lantus as prescribed at 60 units at bedtime. He was sent home on Novolog also but he was given vials instead of his usual pens, and he has not been able to use it at all. His daytime glucoses have ranged up to 180 but at night this may drop to the 50s or 60s and he feels shaky. His BP is stable.    Review of Systems  Constitutional: Negative.   Respiratory: Negative.   Cardiovascular: Negative.   Neurological: Negative.        Objective:   Physical Exam  Constitutional: He is oriented to person, place, and time. He appears well-developed and well-nourished.  Neck: No thyromegaly present.  Cardiovascular: Normal rate, regular rhythm, normal heart sounds and intact distal pulses.   Pulmonary/Chest: Effort normal and breath sounds normal.  Musculoskeletal: He exhibits no edema.  Lymphadenopathy:    He has no cervical adenopathy.  Neurological: He is alert and oriented to person, place, and time.          Assessment & Plan:  His foot infection seems to be healing well and he will follow up with Dr. Sharol Given in 2 days. His HTM and CHF are stable. He will continue taking Lantus but we  will split the dose to take 30 units in the morning and the other 30 in the evening. We will write for him to get back on Novolog Flex Pens like before to take 5 units TID with meals. I gave him a coupon to help with the cost of the Novolog. Get a BMET and an A1c today. Recheck with [place] in 2 weeks.  [name], MD

## 2016-04-25 NOTE — Progress Notes (Signed)
Pre visit review using our clinic review tool, if applicable. No additional management support is needed unless otherwise documented below in the visit note. 

## 2016-05-07 ENCOUNTER — Ambulatory Visit (INDEPENDENT_AMBULATORY_CARE_PROVIDER_SITE_OTHER): Payer: BLUE CROSS/BLUE SHIELD | Admitting: Orthopedic Surgery

## 2016-05-07 DIAGNOSIS — L02611 Cutaneous abscess of right foot: Secondary | ICD-10-CM

## 2016-05-21 ENCOUNTER — Ambulatory Visit (INDEPENDENT_AMBULATORY_CARE_PROVIDER_SITE_OTHER): Payer: BLUE CROSS/BLUE SHIELD | Admitting: Orthopedic Surgery

## 2016-05-21 DIAGNOSIS — L02611 Cutaneous abscess of right foot: Secondary | ICD-10-CM

## 2016-06-05 ENCOUNTER — Encounter (INDEPENDENT_AMBULATORY_CARE_PROVIDER_SITE_OTHER): Payer: Self-pay | Admitting: Family

## 2016-06-05 ENCOUNTER — Ambulatory Visit (INDEPENDENT_AMBULATORY_CARE_PROVIDER_SITE_OTHER): Payer: BLUE CROSS/BLUE SHIELD | Admitting: Family

## 2016-06-05 VITALS — Ht 70.5 in | Wt 215.0 lb

## 2016-06-05 DIAGNOSIS — L02611 Cutaneous abscess of right foot: Secondary | ICD-10-CM
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
## 2016-06-14 ENCOUNTER — Ambulatory Visit (INDEPENDENT_AMBULATORY_CARE_PROVIDER_SITE_OTHER): Payer: BLUE CROSS/BLUE SHIELD | Admitting: *Deleted

## 2016-06-14 DIAGNOSIS — I255 Ischemic cardiomyopathy: Secondary | ICD-10-CM

## 2016-06-14 NOTE — Progress Notes (Signed)
Remote ICD transmission.   

## 2016-06-15 ENCOUNTER — Encounter: Payer: Self-pay | Admitting: Cardiology

## 2016-06-20 ENCOUNTER — Other Ambulatory Visit: Payer: Self-pay | Admitting: Family Medicine

## 2016-06-21 NOTE — Telephone Encounter (Signed)
Pt will be out of insulin tomorrow

## 2016-06-21 NOTE — Telephone Encounter (Signed)
Call in 15 ml with 5 rf

## 2016-07-11 LAB — CUP PACEART REMOTE DEVICE CHECK
Brady Statistic RV Percent Paced: 0 %
Date Time Interrogation Session: 20171109123818
HighPow Impedance: 266 Ohm
HighPow Impedance: 31 Ohm
HighPow Impedance: 36 Ohm
Implantable Lead Implant Date: 20130603
Implantable Lead Location: 753860
Implantable Lead Model: 6947
Implantable Pulse Generator Implant Date: 20130603
Lead Channel Impedance Value: 304 Ohm
Lead Channel Pacing Threshold Amplitude: 0.875 V
Lead Channel Pacing Threshold Pulse Width: 0.4 ms
Lead Channel Setting Sensing Sensitivity: 0.3 mV
MDC IDC MSMT BATTERY VOLTAGE: 3.08 V
MDC IDC MSMT LEADCHNL RV SENSING INTR AMPL: 8.375 mV
MDC IDC MSMT LEADCHNL RV SENSING INTR AMPL: 8.375 mV
MDC IDC SET LEADCHNL RV PACING AMPLITUDE: 2.5 V
MDC IDC SET LEADCHNL RV PACING PULSEWIDTH: 0.4 ms

## 2016-09-18 ENCOUNTER — Encounter: Payer: Self-pay | Admitting: Internal Medicine

## 2016-09-18 ENCOUNTER — Ambulatory Visit (INDEPENDENT_AMBULATORY_CARE_PROVIDER_SITE_OTHER): Payer: BLUE CROSS/BLUE SHIELD | Admitting: Internal Medicine

## 2016-09-18 ENCOUNTER — Encounter (INDEPENDENT_AMBULATORY_CARE_PROVIDER_SITE_OTHER): Payer: Self-pay

## 2016-09-18 VITALS — BP 124/86 | HR 87 | Ht 70.0 in | Wt 218.4 lb

## 2016-09-18 DIAGNOSIS — I255 Ischemic cardiomyopathy: Secondary | ICD-10-CM | POA: Diagnosis not present

## 2016-09-18 DIAGNOSIS — I5022 Chronic systolic (congestive) heart failure: Secondary | ICD-10-CM | POA: Diagnosis not present

## 2016-09-18 DIAGNOSIS — Z95810 Presence of automatic (implantable) cardiac defibrillator: Secondary | ICD-10-CM

## 2016-09-18 LAB — CUP PACEART INCLINIC DEVICE CHECK
Date Time Interrogation Session: 20180213162336
HighPow Impedance: 266 Ohm
HighPow Impedance: 34 Ohm
HighPow Impedance: 41 Ohm
Implantable Lead Location: 753860
Implantable Pulse Generator Implant Date: 20130603
Lead Channel Pacing Threshold Amplitude: 1 V
Lead Channel Setting Pacing Amplitude: 2.5 V
Lead Channel Setting Sensing Sensitivity: 0.3 mV
MDC IDC LEAD IMPLANT DT: 20130603
MDC IDC MSMT BATTERY VOLTAGE: 3.06 V
MDC IDC MSMT LEADCHNL RV IMPEDANCE VALUE: 304 Ohm
MDC IDC MSMT LEADCHNL RV PACING THRESHOLD PULSEWIDTH: 0.4 ms
MDC IDC MSMT LEADCHNL RV SENSING INTR AMPL: 11.75 mV
MDC IDC MSMT LEADCHNL RV SENSING INTR AMPL: 9.5 mV
MDC IDC SET LEADCHNL RV PACING PULSEWIDTH: 0.4 ms
MDC IDC STAT BRADY RV PERCENT PACED: 0.01 %

## 2016-09-18 NOTE — Patient Instructions (Signed)
Medication Instructions: - Your physician recommends that you continue on your current medications as directed. Please refer to the Current Medication list given to you today.  Labwork: - Your physician recommends that you have lab work today: [employer]  Procedures/Testing: - none ordered  Follow-Up: - Remote monitoring is used to monitor your Pacemaker of ICD from home. This monitoring reduces the number of office visits required to check your device to one time per year. It allows [place] to keep an eye on the functioning of your device to ensure it is working properly. You are scheduled for a device check from home on 12/18/16. You may send your transmission at any time that day. If you have a wireless device, the transmission will be sent automatically. After your physician reviews your transmission, you will receive a postcard with your next transmission date.  - Your physician wants you to follow-up in: 6 months with Dr. Aundra Dubin ([facility]) & 1 year with Chanetta Marshall, NP for Dr. Caryl Comes.  You will receive a reminder letter in the mail two months in advance. If you don't receive a letter, please call our office to schedule the follow-up appointment.   Any Additional Special Instructions Will Be Listed Below (If Applicable).     If you need a refill on your cardiac medications before your next appointment, please call your pharmacy.
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
## 2016-09-19 LAB — BASIC METABOLIC PANEL
BUN / CREAT RATIO: 17 (ref 9–20)
BUN: 17 mg/dL (ref 6–24)
CHLORIDE: 101 mmol/L (ref 96–106)
CO2: 20 mmol/L (ref 18–29)
Calcium: 9.6 mg/dL (ref 8.7–10.2)
Creatinine, Ser: 1.02 mg/dL (ref 0.76–1.27)
GFR calc Af Amer: 95 mL/min/{1.73_m2} (ref >59)
GFR calc non Af Amer: 82 mL/min/{1.73_m2} (ref >59)
GLUCOSE: 84 mg/dL (ref 65–99)
POTASSIUM: 4 mmol/L (ref 3.5–5.2)
SODIUM: 145 mmol/L — AB (ref 134–144)

## 2016-10-08 ENCOUNTER — Other Ambulatory Visit (HOSPITAL_COMMUNITY): Payer: Self-pay | Admitting: Internal Medicine

## 2016-10-19 ENCOUNTER — Encounter (HOSPITAL_COMMUNITY): Payer: Self-pay

## 2016-10-25 ENCOUNTER — Telehealth (HOSPITAL_COMMUNITY): Payer: Self-pay

## 2016-10-25 MED ORDER — ISOSORBIDE MONONITRATE ER 120 MG PO TB24: 120.0000 mg | ORAL_TABLET | Freq: Every day | ORAL | 5 refills | Status: DC

## 2016-10-25 MED ORDER — HYDRALAZINE HCL 50 MG PO TABS: 75.0000 mg | ORAL_TABLET | Freq: Three times a day (TID) | ORAL | 5 refills | Status: DC

## 2016-10-25 NOTE — Telephone Encounter (Signed)
[employer] [place] denied [place] for BiDil and appeal. Will unfortunately have to change him over to the separate components. Prescriptions sent to [facility] [employer] on [address], and called patient to instruct him on the change. Will consider BiDil patient assistance program at next appointment.   Belia Heman, PharmD PGY1 Pharmacy Resident [phone] (Pager) 10/25/2016 10:07 AM

## 2016-10-30 ENCOUNTER — Other Ambulatory Visit: Payer: Self-pay | Admitting: Family Medicine

## 2016-11-05 ENCOUNTER — Other Ambulatory Visit: Payer: Self-pay | Admitting: Family Medicine

## 2016-12-03 ENCOUNTER — Other Ambulatory Visit: Payer: Self-pay | Admitting: Cardiology

## 2016-12-03 DIAGNOSIS — I255 Ischemic cardiomyopathy: Secondary | ICD-10-CM

## 2016-12-03 DIAGNOSIS — I5022 Chronic systolic (congestive) heart failure: Secondary | ICD-10-CM

## 2016-12-18 ENCOUNTER — Ambulatory Visit (INDEPENDENT_AMBULATORY_CARE_PROVIDER_SITE_OTHER): Payer: BLUE CROSS/BLUE SHIELD | Admitting: *Deleted

## 2016-12-18 DIAGNOSIS — I255 Ischemic cardiomyopathy: Secondary | ICD-10-CM

## 2016-12-18 NOTE — Progress Notes (Signed)
Remote ICD transmission.   

## 2016-12-19 ENCOUNTER — Encounter: Payer: Self-pay | Admitting: Cardiology

## 2016-12-19 LAB — CUP PACEART REMOTE DEVICE CHECK
Battery Voltage: 3.06 V
Brady Statistic RV Percent Paced: 0 %
Date Time Interrogation Session: 20180515062728
HIGH POWER IMPEDANCE MEASURED VALUE: 266 Ohm
HIGH POWER IMPEDANCE MEASURED VALUE: 39 Ohm
HighPow Impedance: 32 Ohm
Implantable Lead Location: 753860
Lead Channel Impedance Value: 323 Ohm
Lead Channel Sensing Intrinsic Amplitude: 11.125 mV
Lead Channel Setting Pacing Amplitude: 2.5 V
MDC IDC LEAD IMPLANT DT: 20130603
MDC IDC MSMT LEADCHNL RV PACING THRESHOLD AMPLITUDE: 0.875 V
MDC IDC MSMT LEADCHNL RV PACING THRESHOLD PULSEWIDTH: 0.4 ms
MDC IDC MSMT LEADCHNL RV SENSING INTR AMPL: 11.125 mV
MDC IDC PG IMPLANT DT: 20130603
MDC IDC SET LEADCHNL RV PACING PULSEWIDTH: 0.4 ms
MDC IDC SET LEADCHNL RV SENSING SENSITIVITY: 0.3 mV

## 2016-12-27 ENCOUNTER — Other Ambulatory Visit: Payer: Self-pay | Admitting: Family Medicine

## 2017-01-01 ENCOUNTER — Other Ambulatory Visit: Payer: Self-pay | Admitting: Family Medicine

## 2017-01-07 ENCOUNTER — Telehealth: Payer: Self-pay | Admitting: Family Medicine

## 2017-01-07 MED ORDER — INSULIN GLARGINE 100 UNIT/ML SOLOSTAR PEN: PEN_INJECTOR | SUBCUTANEOUS | 0 refills | Status: DC

## 2017-01-07 NOTE — Telephone Encounter (Signed)
I sent script e-scribe and left a voice message for pt with this information.  

## 2017-01-07 NOTE — Telephone Encounter (Signed)
° ° °  Pt is scheduled for a physical on 01/15/17 and is asking if the below med can be refill till then     LANTUS SOLOSTAR 100 UNIT/ML Solostar Pen   [facility] [address]

## 2017-01-15 ENCOUNTER — Ambulatory Visit (INDEPENDENT_AMBULATORY_CARE_PROVIDER_SITE_OTHER): Payer: BLUE CROSS/BLUE SHIELD | Admitting: Family Medicine

## 2017-01-15 ENCOUNTER — Other Ambulatory Visit: Payer: Self-pay

## 2017-01-15 ENCOUNTER — Encounter: Payer: Self-pay | Admitting: Gastroenterology

## 2017-01-15 ENCOUNTER — Encounter: Payer: Self-pay | Admitting: Family Medicine

## 2017-01-15 VITALS — BP 120/96 | HR 75 | Temp 98.6°F | Ht 70.0 in | Wt 215.0 lb

## 2017-01-15 DIAGNOSIS — R972 Elevated prostate specific antigen [PSA]: Secondary | ICD-10-CM

## 2017-01-15 DIAGNOSIS — E119 Type 2 diabetes mellitus without complications: Secondary | ICD-10-CM | POA: Diagnosis not present

## 2017-01-15 DIAGNOSIS — Z0000 Encounter for general adult medical examination without abnormal findings: Secondary | ICD-10-CM

## 2017-01-15 LAB — POC URINALSYSI DIPSTICK (AUTOMATED)
BILIRUBIN UA: NEGATIVE
KETONES UA: NEGATIVE
Leukocytes, UA: NEGATIVE
Nitrite, UA: NEGATIVE
PH UA: 6 (ref 5.0–8.0)
SPEC GRAV UA: 1.015 (ref 1.010–1.025)
Urobilinogen, UA: 0.2 E.U./dL

## 2017-01-15 LAB — HEPATIC FUNCTION PANEL
ALBUMIN: 4.4 g/dL (ref 3.5–5.2)
ALT: 22 U/L (ref 0–53)
AST: 22 U/L (ref 0–37)
Alkaline Phosphatase: 104 U/L (ref 39–117)
BILIRUBIN TOTAL: 0.6 mg/dL (ref 0.2–1.2)
Bilirubin, Direct: 0.1 mg/dL (ref 0.0–0.3)
TOTAL PROTEIN: 7.9 g/dL (ref 6.0–8.3)

## 2017-01-15 LAB — BASIC METABOLIC PANEL
BUN: 48 mg/dL — ABNORMAL HIGH (ref 6–23)
CALCIUM: 9.7 mg/dL (ref 8.4–10.5)
CO2: 30 mEq/L (ref 19–32)
CREATININE: 1.5 mg/dL (ref 0.40–1.50)
Chloride: 99 mEq/L (ref 96–112)
GFR: 62.22 mL/min (ref >60.00)
Glucose, Bld: 322 mg/dL — ABNORMAL HIGH (ref 70–99)
Potassium: 4.4 mEq/L (ref 3.5–5.1)
Sodium: 136 mEq/L (ref 135–145)

## 2017-01-15 LAB — CBC WITH DIFFERENTIAL/PLATELET
BASOS ABS: 0 10*3/uL (ref 0.0–0.1)
Basophils Relative: 0.5 % (ref 0.0–3.0)
Eosinophils Absolute: 0.1 10*3/uL (ref 0.0–0.7)
Eosinophils Relative: 1.6 % (ref 0.0–5.0)
HCT: 45.9 % (ref 39.0–52.0)
Hemoglobin: 15 g/dL (ref 13.0–17.0)
LYMPHS ABS: 1.9 10*3/uL (ref 0.7–4.0)
Lymphocytes Relative: 31.3 % (ref 12.0–46.0)
MCHC: 32.6 g/dL (ref 30.0–36.0)
MCV: 80.6 fl (ref 78.0–100.0)
MONO ABS: 0.5 10*3/uL (ref 0.1–1.0)
MONOS PCT: 7.5 % (ref 3.0–12.0)
NEUTROS ABS: 3.6 10*3/uL (ref 1.4–7.7)
NEUTROS PCT: 59.1 % (ref 43.0–77.0)
PLATELETS: 210 10*3/uL (ref 150.0–400.0)
RBC: 5.7 Mil/uL (ref 4.22–5.81)
RDW: 15.7 % — ABNORMAL HIGH (ref 11.5–15.5)
WBC: 6.1 10*3/uL (ref 4.0–10.5)

## 2017-01-15 LAB — HEMOGLOBIN A1C: HEMOGLOBIN A1C: 15.3 % — AB (ref 4.6–6.5)

## 2017-01-15 LAB — TSH: TSH: 1.4 u[IU]/mL (ref 0.35–4.50)

## 2017-01-15 LAB — PSA: PSA: 11.21 ng/mL — ABNORMAL HIGH (ref 0.10–4.00)

## 2017-01-15 LAB — LIPID PANEL
CHOL/HDL RATIO: 6
Cholesterol: 196 mg/dL (ref 0–200)
HDL: 31.9 mg/dL — ABNORMAL LOW (ref >39.00)
LDL Cholesterol: 138 mg/dL — ABNORMAL HIGH (ref 0–99)
NONHDL: 164.44
Triglycerides: 131 mg/dL (ref 0.0–149.0)
VLDL: 26.2 mg/dL (ref 0.0–40.0)

## 2017-01-15 MED ORDER — INSULIN PEN NEEDLE 32G X 4 MM MISC: 11 refills | Status: DC

## 2017-01-15 MED ORDER — SACUBITRIL-VALSARTAN 97-103 MG PO TABS: 1.0000 | ORAL_TABLET | Freq: Two times a day (BID) | ORAL | 11 refills | Status: DC

## 2017-01-15 MED ORDER — SPIRONOLACTONE 25 MG PO TABS: 25.0000 mg | ORAL_TABLET | Freq: Every day | ORAL | 11 refills | Status: DC

## 2017-01-15 MED ORDER — INSULIN GLARGINE 100 UNIT/ML SOLOSTAR PEN: PEN_INJECTOR | SUBCUTANEOUS | 11 refills | Status: DC

## 2017-01-15 MED ORDER — ATORVASTATIN CALCIUM 80 MG PO TABS: 80.0000 mg | ORAL_TABLET | Freq: Every day | ORAL | 11 refills | Status: DC

## 2017-01-15 MED ORDER — CARVEDILOL 12.5 MG PO TABS: 18.7500 mg | ORAL_TABLET | Freq: Two times a day (BID) | ORAL | 11 refills | Status: DC

## 2017-01-15 NOTE — Addendum Note (Signed)
Addended by: Alysia Penna A on: 01/15/2017 12:36 PM   Modules accepted: Orders

## 2017-01-15 NOTE — Patient Instructions (Signed)
WE NOW OFFER   Marcus Johnson's FAST TRACK!!!  SAME DAY Appointments for ACUTE CARE  Such as: Sprains, Injuries, cuts, abrasions, rashes, muscle pain, joint pain, back pain Colds, flu, sore throats, headache, allergies, cough, fever  Ear pain, sinus and eye infections Abdominal pain, nausea, vomiting, diarrhea, upset stomach Animal/insect bites  3 Easy Ways to Schedule: Walk-In Scheduling Call in scheduling Mychart Sign-up: [email or web address]         

## 2017-01-15 NOTE — Progress Notes (Signed)
   Subjective:    Patient ID: [place], male    DOB: October 10, 1960, 56 y.o.   MRN: [id number]  HPI 56 yr old male for a well exam. He feels fine. He has not checked his glucoses at home for some time. He has not seen Dr. Aundra Dubin for over a year. He has never ad a colonoscopy, and it has been many years since he had an eye exam.    Review of Systems  Constitutional: Negative.   HENT: Negative.   Eyes: Negative.   Respiratory: Negative.   Cardiovascular: Negative.   Gastrointestinal: Negative.   Genitourinary: Negative.   Musculoskeletal: Negative.   Skin: Negative.   Neurological: Negative.   Psychiatric/Behavioral: Negative.        Objective:   Physical Exam  Constitutional: He is oriented to person, place, and time. He appears well-developed and well-nourished. No distress.  HENT:  Head: Normocephalic and atraumatic.  Right Ear: External ear normal.  Left Ear: External ear normal.  Nose: Nose normal.  Mouth/Throat: Oropharynx is clear and moist. No oropharyngeal exudate.  Eyes: Conjunctivae and EOM are normal. Pupils are equal, round, and reactive to light. Right eye exhibits no discharge. Left eye exhibits no discharge. No scleral icterus.  Neck: Neck supple. No JVD present. No tracheal deviation present. No thyromegaly present.  Cardiovascular: Normal rate, regular rhythm, normal heart sounds and intact distal pulses.  Exam reveals no gallop and no friction rub.   No murmur heard. Pulmonary/Chest: Effort normal and breath sounds normal. No respiratory distress. He has no wheezes. He has no rales. He exhibits no tenderness.  Abdominal: Soft. Bowel sounds are normal. He exhibits no distension and no mass. There is no tenderness. There is no rebound and no guarding.  Genitourinary: Rectum normal, prostate normal and penis normal. Rectal exam shows guaiac negative stool. No penile tenderness.  Musculoskeletal: Normal range of motion. He exhibits no edema or tenderness.    Lymphadenopathy:    He has no cervical adenopathy.  Neurological: He is alert and oriented to person, place, and time. He has normal reflexes. No cranial nerve deficit. He exhibits normal muscle tone. Coordination normal.  Skin: Skin is warm and dry. No rash noted. He is not diaphoretic. No erythema. No pallor.  Psychiatric: He has a normal mood and affect. His behavior is normal. Judgment and thought content normal.          Assessment & Plan:  Well exam. We discussed diet and exercise. Get fasting labs including an A1c. Set up a colonoscopy. Refer to Ophthalmology for an eye exam. He will schedule to see Dr. Aundra Dubin again soon.  Alysia Penna, MD

## 2017-02-14 DIAGNOSIS — H2513 Age-related nuclear cataract, bilateral: Secondary | ICD-10-CM | POA: Diagnosis not present

## 2017-02-14 DIAGNOSIS — H25013 Cortical age-related cataract, bilateral: Secondary | ICD-10-CM | POA: Diagnosis not present

## 2017-02-14 DIAGNOSIS — E113493 Type 2 diabetes mellitus with severe nonproliferative diabetic retinopathy without macular edema, bilateral: Secondary | ICD-10-CM | POA: Diagnosis not present

## 2017-02-14 DIAGNOSIS — H43812 Vitreous degeneration, left eye: Secondary | ICD-10-CM | POA: Diagnosis not present

## 2017-02-14 LAB — HM DIABETES EYE EXAM

## 2017-02-25 ENCOUNTER — Encounter: Payer: Self-pay | Admitting: Gastroenterology

## 2017-02-25 ENCOUNTER — Telehealth: Payer: Self-pay | Admitting: *Deleted

## 2017-02-25 NOTE — Telephone Encounter (Signed)
Office visit, please. Thanks for your diligence reviewing his chart

## 2017-02-25 NOTE — Telephone Encounter (Signed)
Patient is for direct screening colonoscopy. After reviewing pt's chart complicated medical history, EF 25% on 12/29/15, hx DM,CHF,ICD,MRSA... Please review ok for direct hospital or OV first? Please advise. Thank you, Jazmynn Pho PV

## 2017-02-25 NOTE — Telephone Encounter (Signed)
Office visit made 04/22/17. Pt aware.

## 2017-02-25 NOTE — Telephone Encounter (Signed)
Called patient, no answer. Left message for patient to call us back. He needs office visit before direct colonoscopy.

## 2017-03-01 ENCOUNTER — Encounter: Payer: Self-pay | Admitting: Internal Medicine

## 2017-03-19 ENCOUNTER — Ambulatory Visit (INDEPENDENT_AMBULATORY_CARE_PROVIDER_SITE_OTHER): Payer: BLUE CROSS/BLUE SHIELD | Admitting: *Deleted

## 2017-03-19 DIAGNOSIS — I255 Ischemic cardiomyopathy: Secondary | ICD-10-CM | POA: Diagnosis not present

## 2017-03-19 DIAGNOSIS — I5022 Chronic systolic (congestive) heart failure: Secondary | ICD-10-CM

## 2017-03-20 ENCOUNTER — Encounter: Payer: BLUE CROSS/BLUE SHIELD | Admitting: Gastroenterology

## 2017-03-20 LAB — CUP PACEART REMOTE DEVICE CHECK
Battery Voltage: 3.04 V
Brady Statistic RV Percent Paced: 0.01 %
Date Time Interrogation Session: 20180814073530
HIGH POWER IMPEDANCE MEASURED VALUE: 41 Ohm
HighPow Impedance: 304 Ohm
HighPow Impedance: 35 Ohm
Implantable Lead Implant Date: 20130603
Implantable Pulse Generator Implant Date: 20130603
Lead Channel Impedance Value: 342 Ohm
Lead Channel Pacing Threshold Pulse Width: 0.4 ms
Lead Channel Setting Pacing Amplitude: 2.5 V
MDC IDC LEAD LOCATION: 753860
MDC IDC MSMT LEADCHNL RV PACING THRESHOLD AMPLITUDE: 0.875 V
MDC IDC MSMT LEADCHNL RV SENSING INTR AMPL: 11.875 mV
MDC IDC MSMT LEADCHNL RV SENSING INTR AMPL: 11.875 mV
MDC IDC SET LEADCHNL RV PACING PULSEWIDTH: 0.4 ms
MDC IDC SET LEADCHNL RV SENSING SENSITIVITY: 0.3 mV

## 2017-03-20 NOTE — Progress Notes (Signed)
Remote defibrillator check.  

## 2017-03-22 ENCOUNTER — Encounter: Payer: Self-pay | Admitting: Internal Medicine

## 2017-04-02 ENCOUNTER — Encounter: Payer: Self-pay | Admitting: Cardiology

## 2017-04-22 ENCOUNTER — Ambulatory Visit: Payer: BLUE CROSS/BLUE SHIELD | Admitting: Gastroenterology

## 2017-05-03 ENCOUNTER — Other Ambulatory Visit: Payer: Self-pay | Admitting: Family Medicine

## 2017-06-11 ENCOUNTER — Ambulatory Visit (INDEPENDENT_AMBULATORY_CARE_PROVIDER_SITE_OTHER): Payer: BLUE CROSS/BLUE SHIELD | Admitting: Family Medicine

## 2017-06-11 ENCOUNTER — Encounter: Payer: Self-pay | Admitting: Family Medicine

## 2017-06-11 VITALS — BP 130/78 | HR 98 | Temp 98.9°F | Ht 70.0 in | Wt 242.1 lb

## 2017-06-11 DIAGNOSIS — A084 Viral intestinal infection, unspecified: Secondary | ICD-10-CM

## 2017-06-11 MED ORDER — ONDANSETRON HCL 8 MG PO TABS: 8.0000 mg | ORAL_TABLET | Freq: Three times a day (TID) | ORAL | 0 refills | Status: DC | PRN

## 2017-06-11 NOTE — Progress Notes (Signed)
   Subjective:    Patient ID: [place], male    DOB: 08-22-1960, 56 y.o.   MRN: [id number]  HPI Here for 2 days of nausea and vomiting, with some slight diarrhea. No abdominal pain but he feels swollen. No fever.    Review of Systems  Constitutional: Negative.   Respiratory: Negative.   Cardiovascular: Negative.   Gastrointestinal: Positive for abdominal distention, diarrhea, nausea and vomiting. Negative for abdominal pain, anal bleeding, blood in stool and constipation.  Genitourinary: Negative.        Objective:   Physical Exam  Constitutional: He appears well-developed and well-nourished.  Cardiovascular: Normal rate, regular rhythm, normal heart sounds and intact distal pulses.  Pulmonary/Chest: Effort normal and breath sounds normal.  Abdominal: Soft. Bowel sounds are normal. He exhibits distension. He exhibits no mass. There is no tenderness. There is no rebound and no guarding.          Assessment & Plan:  He has a viral enteritis which should resolve in a day or two. Drink fluids. Use Zofran for nausea.  Alysia Penna, MD

## 2017-06-18 ENCOUNTER — Ambulatory Visit (INDEPENDENT_AMBULATORY_CARE_PROVIDER_SITE_OTHER): Payer: BLUE CROSS/BLUE SHIELD | Admitting: *Deleted

## 2017-06-18 DIAGNOSIS — I255 Ischemic cardiomyopathy: Secondary | ICD-10-CM | POA: Diagnosis not present

## 2017-06-18 NOTE — Progress Notes (Signed)
Remote ICD transmission.   

## 2017-06-19 LAB — CUP PACEART REMOTE DEVICE CHECK
Battery Voltage: 3.04 V
Brady Statistic RV Percent Paced: 0 %
Date Time Interrogation Session: 20181113083328
HIGH POWER IMPEDANCE MEASURED VALUE: 266 Ohm
HIGH POWER IMPEDANCE MEASURED VALUE: 38 Ohm
HighPow Impedance: 31 Ohm
Implantable Pulse Generator Implant Date: 20130603
Lead Channel Impedance Value: 323 Ohm
Lead Channel Sensing Intrinsic Amplitude: 9.875 mV
Lead Channel Setting Pacing Amplitude: 2.5 V
MDC IDC LEAD IMPLANT DT: 20130603
MDC IDC LEAD LOCATION: 753860
MDC IDC MSMT LEADCHNL RV PACING THRESHOLD AMPLITUDE: 1 V
MDC IDC MSMT LEADCHNL RV PACING THRESHOLD PULSEWIDTH: 0.4 ms
MDC IDC MSMT LEADCHNL RV SENSING INTR AMPL: 9.875 mV
MDC IDC SET LEADCHNL RV PACING PULSEWIDTH: 0.4 ms
MDC IDC SET LEADCHNL RV SENSING SENSITIVITY: 0.3 mV

## 2017-06-21 ENCOUNTER — Encounter: Payer: Self-pay | Admitting: Cardiology

## 2017-07-10 ENCOUNTER — Encounter (INDEPENDENT_AMBULATORY_CARE_PROVIDER_SITE_OTHER): Payer: BLUE CROSS/BLUE SHIELD | Admitting: Ophthalmology

## 2017-08-01 ENCOUNTER — Telehealth: Payer: Self-pay

## 2017-08-01 NOTE — Telephone Encounter (Signed)
Refill request for Novolog 100 Units/ ML flexpen need Dx code.

## 2017-08-02 MED ORDER — INSULIN ASPART 100 UNIT/ML FLEXPEN: PEN_INJECTOR | SUBCUTANEOUS | 0 refills | Status: DC

## 2017-08-02 NOTE — Telephone Encounter (Signed)
Last OV 06/11/2017. Rx was last refilled 05/06/2017 disp 15 ML with no refills. Need Code

## 2017-08-09 ENCOUNTER — Other Ambulatory Visit (HOSPITAL_COMMUNITY): Payer: Self-pay | Admitting: Cardiology

## 2017-08-30 ENCOUNTER — Other Ambulatory Visit (HOSPITAL_COMMUNITY): Payer: Self-pay | Admitting: *Deleted

## 2017-08-30 DIAGNOSIS — I255 Ischemic cardiomyopathy: Secondary | ICD-10-CM

## 2017-08-30 DIAGNOSIS — I5022 Chronic systolic (congestive) heart failure: Secondary | ICD-10-CM

## 2017-08-30 MED ORDER — TORSEMIDE 20 MG PO TABS: ORAL_TABLET | ORAL | 0 refills | Status: DC

## 2017-09-02 ENCOUNTER — Telehealth (HOSPITAL_COMMUNITY): Payer: Self-pay | Admitting: Cardiology

## 2017-09-02 NOTE — Telephone Encounter (Signed)
Called patient and [place].  Need to schedule pt for f/u with Dr. Aundra Dubin for med refills per Georgeanna Lea, RN.

## 2017-09-17 ENCOUNTER — Ambulatory Visit (INDEPENDENT_AMBULATORY_CARE_PROVIDER_SITE_OTHER): Payer: BLUE CROSS/BLUE SHIELD | Admitting: *Deleted

## 2017-09-17 DIAGNOSIS — I255 Ischemic cardiomyopathy: Secondary | ICD-10-CM | POA: Diagnosis not present

## 2017-09-18 ENCOUNTER — Encounter: Payer: Self-pay | Admitting: Cardiology

## 2017-09-18 NOTE — Progress Notes (Signed)
Remote ICD transmission.   

## 2017-09-26 ENCOUNTER — Telehealth (HOSPITAL_COMMUNITY): Payer: Self-pay | Admitting: Cardiology

## 2017-09-26 LAB — CUP PACEART REMOTE DEVICE CHECK
Brady Statistic RV Percent Paced: 0 %
HIGH POWER IMPEDANCE MEASURED VALUE: 32 Ohm
HighPow Impedance: 266 Ohm
HighPow Impedance: 39 Ohm
Implantable Lead Implant Date: 20130603
Implantable Lead Model: 6947
Lead Channel Impedance Value: 323 Ohm
Lead Channel Pacing Threshold Amplitude: 0.75 V
Lead Channel Pacing Threshold Pulse Width: 0.4 ms
Lead Channel Sensing Intrinsic Amplitude: 10.25 mV
Lead Channel Setting Sensing Sensitivity: 0.3 mV
MDC IDC LEAD LOCATION: 753860
MDC IDC MSMT BATTERY VOLTAGE: 3.01 V
MDC IDC MSMT LEADCHNL RV SENSING INTR AMPL: 10.25 mV
MDC IDC PG IMPLANT DT: 20130603
MDC IDC SESS DTM: 20190212093728
MDC IDC SET LEADCHNL RV PACING AMPLITUDE: 2.5 V
MDC IDC SET LEADCHNL RV PACING PULSEWIDTH: 0.4 ms

## 2017-09-26 NOTE — Telephone Encounter (Signed)
Called patient and left message to call back so we can reschedule his 09/30/17 appt with Dr. Aundra Dubin.

## 2017-09-30 ENCOUNTER — Encounter (HOSPITAL_COMMUNITY): Payer: BLUE CROSS/BLUE SHIELD | Admitting: Cardiology

## 2017-10-18 ENCOUNTER — Other Ambulatory Visit: Payer: Self-pay | Admitting: Family Medicine

## 2017-11-10 ENCOUNTER — Other Ambulatory Visit (HOSPITAL_COMMUNITY): Payer: Self-pay | Admitting: Cardiology

## 2017-11-10 DIAGNOSIS — I5022 Chronic systolic (congestive) heart failure: Secondary | ICD-10-CM

## 2017-11-10 DIAGNOSIS — I255 Ischemic cardiomyopathy: Secondary | ICD-10-CM

## 2017-11-11 ENCOUNTER — Encounter (HOSPITAL_COMMUNITY): Payer: Self-pay | Admitting: Cardiology

## 2017-11-11 ENCOUNTER — Other Ambulatory Visit: Payer: Self-pay

## 2017-11-11 ENCOUNTER — Ambulatory Visit (HOSPITAL_COMMUNITY)
Admission: RE | Admit: 2017-11-11 | Discharge: 2017-11-11 | Disposition: A | Discharge status: Home / Self Care | Payer: BLUE CROSS/BLUE SHIELD | Source: Ambulatory Visit | Attending: Cardiology | Admitting: Cardiology

## 2017-11-11 VITALS — BP 142/92 | HR 91 | Wt 235.2 lb

## 2017-11-11 DIAGNOSIS — I5022 Chronic systolic (congestive) heart failure: Secondary | ICD-10-CM | POA: Insufficient documentation

## 2017-11-11 DIAGNOSIS — Z79899 Other long term (current) drug therapy: Secondary | ICD-10-CM | POA: Insufficient documentation

## 2017-11-11 DIAGNOSIS — I4581 Long QT syndrome: Secondary | ICD-10-CM | POA: Insufficient documentation

## 2017-11-11 DIAGNOSIS — N183 Chronic kidney disease, stage 3 (moderate): Secondary | ICD-10-CM | POA: Insufficient documentation

## 2017-11-11 DIAGNOSIS — Z95810 Presence of automatic (implantable) cardiac defibrillator: Secondary | ICD-10-CM | POA: Diagnosis not present

## 2017-11-11 DIAGNOSIS — E1122 Type 2 diabetes mellitus with diabetic chronic kidney disease: Secondary | ICD-10-CM | POA: Insufficient documentation

## 2017-11-11 DIAGNOSIS — R0609 Other forms of dyspnea: Secondary | ICD-10-CM | POA: Insufficient documentation

## 2017-11-11 DIAGNOSIS — Z8546 Personal history of malignant neoplasm of prostate: Secondary | ICD-10-CM | POA: Insufficient documentation

## 2017-11-11 DIAGNOSIS — Z9889 Other specified postprocedural states: Secondary | ICD-10-CM | POA: Diagnosis not present

## 2017-11-11 DIAGNOSIS — Z951 Presence of aortocoronary bypass graft: Secondary | ICD-10-CM | POA: Insufficient documentation

## 2017-11-11 DIAGNOSIS — R05 Cough: Secondary | ICD-10-CM | POA: Diagnosis not present

## 2017-11-11 DIAGNOSIS — I130 Hypertensive heart and chronic kidney disease with heart failure and stage 1 through stage 4 chronic kidney disease, or unspecified chronic kidney disease: Secondary | ICD-10-CM | POA: Insufficient documentation

## 2017-11-11 DIAGNOSIS — R011 Cardiac murmur, unspecified: Secondary | ICD-10-CM | POA: Insufficient documentation

## 2017-11-11 DIAGNOSIS — I2510 Atherosclerotic heart disease of native coronary artery without angina pectoris: Secondary | ICD-10-CM | POA: Diagnosis not present

## 2017-11-11 DIAGNOSIS — I255 Ischemic cardiomyopathy: Secondary | ICD-10-CM | POA: Insufficient documentation

## 2017-11-11 DIAGNOSIS — I5042 Chronic combined systolic (congestive) and diastolic (congestive) heart failure: Secondary | ICD-10-CM

## 2017-11-11 DIAGNOSIS — E1140 Type 2 diabetes mellitus with diabetic neuropathy, unspecified: Secondary | ICD-10-CM | POA: Diagnosis not present

## 2017-11-11 DIAGNOSIS — Z794 Long term (current) use of insulin: Secondary | ICD-10-CM | POA: Diagnosis not present

## 2017-11-11 DIAGNOSIS — Z7982 Long term (current) use of aspirin: Secondary | ICD-10-CM | POA: Insufficient documentation

## 2017-11-11 LAB — CBC
HEMATOCRIT: 42.6 % (ref 39.0–52.0)
Hemoglobin: 13.6 g/dL (ref 13.0–17.0)
MCH: 26 pg (ref 26.0–34.0)
MCHC: 31.9 g/dL (ref 30.0–36.0)
MCV: 81.3 fL (ref 78.0–100.0)
PLATELETS: 180 10*3/uL (ref 150–400)
RBC: 5.24 MIL/uL (ref 4.22–5.81)
RDW: 16.9 % — ABNORMAL HIGH (ref 11.5–15.5)
WBC: 8.7 10*3/uL (ref 4.0–10.5)

## 2017-11-11 LAB — LIPID PANEL
Cholesterol: 154 mg/dL (ref 0–200)
HDL: 36 mg/dL — AB (ref >40)
LDL CALC: 108 mg/dL — AB (ref 0–99)
Total CHOL/HDL Ratio: 4.3 RATIO
Triglycerides: 49 mg/dL (ref <150)
VLDL: 10 mg/dL (ref 0–40)

## 2017-11-11 LAB — BASIC METABOLIC PANEL
Anion gap: 13 (ref 5–15)
BUN: 43 mg/dL — AB (ref 6–20)
CO2: 22 mmol/L (ref 22–32)
CREATININE: 1.43 mg/dL — AB (ref 0.61–1.24)
Calcium: 9.3 mg/dL (ref 8.9–10.3)
Chloride: 104 mmol/L (ref 101–111)
GFR calc Af Amer: >60 mL/min (ref >60)
GFR, EST NON AFRICAN AMERICAN: 53 mL/min — AB (ref >60)
Glucose, Bld: 209 mg/dL — ABNORMAL HIGH (ref 65–99)
POTASSIUM: 4.2 mmol/L (ref 3.5–5.1)
SODIUM: 139 mmol/L (ref 135–145)

## 2017-11-11 MED ORDER — METOLAZONE 2.5 MG PO TABS: ORAL_TABLET | ORAL | 0 refills | Status: DC

## 2017-11-11 MED ORDER — TORSEMIDE 20 MG PO TABS: ORAL_TABLET | ORAL | 3 refills | Status: DC

## 2017-11-11 MED ORDER — POTASSIUM CHLORIDE CRYS ER 20 MEQ PO TBCR: 20.0000 meq | EXTENDED_RELEASE_TABLET | Freq: Every day | ORAL | 3 refills | Status: DC

## 2017-11-11 MED ORDER — HYDRALAZINE HCL 50 MG PO TABS: 75.0000 mg | ORAL_TABLET | Freq: Three times a day (TID) | ORAL | 3 refills | Status: DC

## 2017-11-11 NOTE — Patient Instructions (Signed)
Labs today (will call for abnormal results, otherwise no news is good news)  INCREASE Torsemide to 80 mg (4 Tablets) in the AM and 40 mg (2 Tablets) in the PM.  Take Metolazone 2.5 mg (1 Tab) This Tuesday and Thursday only.  Take 30 minutes prior to morning torsemide dose.  START taking potassium 20 mEq (1 Tablet) Once Daily.  Cardiopulmonary test has been ordered for you, please see attached instruction sheet.  Follow up in the APP clinic in 1 week.  Echocardiogram and follow up with Dr. Aundra Dubin in 4-6 weeks.

## 2017-11-12 NOTE — Progress Notes (Signed)
Patient ID: [place], male   DOB: [birth year], 57 y.o.   MRN: [id number] PCP: Dr. Sarajane Jews Cardiology: Dr. Aundra Dubin  HPI:  [place] is a 57 y.o. male with h/o DM2, CAD s/p CABG 3220, systolic HF due to ischemic cardiomyopathy with EF 20-25% (echo 12/15), DM2 and CKD. He is s/p Medtronic single chamber ICD.  Admitted in 12/15 due to ADHF. Required short course milrinone for diuresis. Diuresed 30 pounds.   CPX 2/16 showed severely reduced functional capacity.  There was a significant disconnect between his symptoms and the CPX.  RHC in 6/16 showed fairly normal filling pressures and low but not markedly low cardiac output.   He returns for followup of CHF and CAD. He was lost to followup in this office for over a year. He still works full time.  Weight is up about 9 lbs since he was last seen here.  Dyspnea has been worse for a couple of months.  He is short of breath walking about 100 yards or going up stairs.  +Nocturnal cough but no PND or orthopnea.  +Bendopnea.  BP is high today, but he has been out of hydralazine.  He is taking his other meds.    ECG (personally reviewed): NSR, left axis deviation, nonspecific T wave flattening   Studies: Echo (12/15): EF 20-25% RV moderately HK CPX (2/16): RER 1.10, peak VO2 8.6, VE/VCO2 slope 43.7 => severely reduced functional capacity.  RHC (6/16): mean RA 8, PA 48/19 mean 30, mean PCWP 14, CI 2.03 Fick/2.28 Thermo, PVR 3.6.  Echo (5/17): EF 25%, mild LV dilation, moderate diastolic dysfunction, normal RV size with mild to moderate systolic dysfunction, mild mitral regurgitation  Labs:  8/15 LDL 88 12/15 K+ 4.2 creatinine 1.09  1/16 K 4.3, creatinine 1.34 4/16 K 4.5, creatinine 1.19 3/17 K 3.8, creatinine 1.22, HCT 35.3, BNP 1597 5/17 K 4.1, creatinine 1.25 6/18 LDL 138, K 4.4, creatinine 1.5  ROS: All systems negative except as listed in HPI, PMH and Problem List.  SH:  Social History   Socioeconomic History  . Marital status: Single   Spouse name: Not on file  . Number of children: Not on file  . Years of education: Not on file  . Highest education level: Not on file  Occupational History  . Not on file  Social Needs  . Financial resource strain: Not on file  . Food insecurity:    Worry: Not on file    Inability: Not on file  . Transportation needs:    Medical: Not on file    Non-medical: Not on file  Tobacco Use  . Smoking status: Never Smoker  . Smokeless tobacco: Never Used  Substance and Sexual Activity  . Alcohol use: No    Alcohol/week: 0.0 oz  . Drug use: No  . Sexual activity: Yes  Lifestyle  . Physical activity:    Days per week: Not on file    Minutes per session: Not on file  . Stress: Not on file  Relationships  . Social connections:    Talks on phone: Not on file    Gets together: Not on file    Attends religious service: Not on file    Active member of club or organization: Not on file    Attends meetings of clubs or organizations: Not on file    Relationship status: Not on file  . Intimate partner violence:    Fear of current or ex partner: Not on file  Emotionally abused: Not on file    Physically abused: Not on file    Forced sexual activity: Not on file  Other Topics Concern  . Not on file  Social History Narrative   Lives in [place] with his son and his family.  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    FH:  Family History  Problem Relation Age of Onset  . Diabetes Father        died in his 8's  . Hypertension Mother        died in her 104's - had a ppm.    Past Medical History:  Diagnosis Date  . Chronic systolic heart failure ([place])    a. Echo 5/13: Mild LVE, mild LVH, EF 10%, anteroseptal, lateral, apical AK, mild MR, mild LAE, moderately reduced RVSF, mild RAE, PASP 60;  b. 07/2014 Echo: EF 20-2%, diff HK, AKI of antsep/apical/mid-apicalinferior, mod reduced RV.  Marland Kitchen Coronary artery disease    a. s/p CABG 2002 b. LHC 5/13:  dLM 80%, LAD  subtotally occluded, pCFX occluded, pRCA 50%, mid? Occlusion with high take off of the PDA with 70% multiple lesions-not bypassed and supplies collaterals to LAD, LIMA-IM/ramus ok, S-OM ok, S-PLA branches ok. Medical therapy was recommended  . Diabetes mellitus    type 2 requiring insulin   . Hypertension   . Ischemic cardiomyopathy    a. Echo 5/13: Mild LVE, mild LVH, EF 10%, anteroseptal, lateral, apical AK, mild MR, mild LAE, moderately reduced RVSF, mild RAE, PASP 60;  b. 01/2012 s/p MDT [id number] Protecta XT VR AICD;  c. 07/2014 Echo: EF 20-2%, diff HK, AKI of antsep/apical/mid-apicalinferior, mod reduced RV.  Marland Kitchen MRSA (methicillin resistant Staphylococcus aureus)    Status post right foot plantar deep infection with MRSA status post  I&D 10/2008  . Noncompliance   . Peripheral neuropathy   . Single-chamber  implantable cardiac defibrillator - Medtronic   . Syncope     Current Outpatient Medications  Medication Sig Dispense Refill  . allopurinol (ZYLOPRIM) 300 MG tablet TAKE 1 TABLET (300 MG TOTAL) BY MOUTH DAILY. 90 tablet 3  . aspirin EC 81 MG tablet Take 1 tablet (81 mg total) by mouth daily.    Marland Kitchen atorvastatin (LIPITOR) 80 MG tablet Take 1 tablet (80 mg total) by mouth daily. 30 tablet 11  . BAYER BREEZE 2 TEST DISK USE TO TEST BLOOD SUGAR 3 TIMES DAILY. 100 each 1  . carvedilol (COREG) 12.5 MG tablet Take 1.5 tablets (18.75 mg total) by mouth 2 (two) times daily with a meal. 90 tablet 11  . hydrALAZINE (APRESOLINE) 50 MG tablet Take 1.5 tablets (75 mg total) by mouth 3 (three) times daily. 135 tablet 3  . Insulin Glargine (LANTUS SOLOSTAR) 100 UNIT/ML Solostar Pen INJECT 60 UNITS INTO THE SKIN DAILY AT 10 PM 15 mL 11  . Insulin Pen Needle (BD PEN NEEDLE NANO U/F) 32G X 4 MM MISC USE 4 TIMES DAILY 100 each 11  . isosorbide mononitrate (IMDUR) 120 MG 24 hr tablet Take 1 tablet (120 mg total) by mouth daily. 30 tablet 5  . NOVOLOG FLEXPEN 100 UNIT/ML FlexPen INJECT 5 UNITS INTO THE SKIN 3  TIMES A DAY WITH MEALS 15 mL 0  . ondansetron (ZOFRAN) 8 MG tablet Take 1 tablet (8 mg total) every 8 (eight) hours as needed by mouth for nausea or vomiting. 30 tablet 0  . sacubitril-valsartan (ENTRESTO) 97-103 MG Take 1 tablet by mouth 2 (two)  times daily. 60 tablet 11  . spironolactone (ALDACTONE) 25 MG tablet Take 1 tablet (25 mg total) by mouth daily. 30 tablet 11  . metolazone (ZAROXOLYN) 2.5 MG tablet Take 2.5 mg (1 Tab) this Tuesday and Thursday only. 2 tablet 0  . potassium chloride SA (K-DUR,KLOR-CON) 20 MEQ tablet Take 1 tablet (20 mEq total) by mouth daily. 90 tablet 3  . torsemide (DEMADEX) 20 MG tablet Take 80 mg (4 Tabs) in the AM and 40 mg (2 Tabs) in the PM. 540 tablet 3   No current facility-administered medications for this encounter.     Vitals:   11/11/17 1435  BP: (!) 142/92  Pulse: 91  SpO2: 100%  Weight: 235 lb 4 oz (106.7 kg)    PHYSICAL EXAM: General: NAD Neck: JVP 12 cm, no thyromegaly or thyroid nodule.  Lungs: Clear to auscultation bilaterally with normal respiratory effort. CV: Nondisplaced PMI.  Heart regular S1/S2, no S3/S4, 3/6 HSM apex.  1+ edema to knees.  No carotid bruit.  Normal pedal pulses.  Abdomen: Soft, nontender, no hepatosplenomegaly, no distention.  Skin: Intact without lesions or rashes.  Neurologic: Alert and oriented x 3.  Psych: Normal affect. Extremities: No clubbing or cyanosis.  HEENT: Normal.   ASSESSMENT & PLAN: 1. Chronic systolic HF:  Ischemic CMP, EF 20-25% (Echo 12/15). Repeat echo 5/17 with EF 25%.  Admit in 12/15, required short-term milrinone for low output.  Medtronic ICD, narrow QRS so not CRT candidate. Markedly abnormal CPX in 2/16 with poor functional capacity due to circulatory limitation, maximal effort. RHC in 6/16 showed near-normal filling pressures and low but not markedly low cardiac output. He was lost to followup for over a year, but now returns with worsening exertional dyspnea (NYHA class III) and  significant volume overload on exam.  - Increase torsemide to 80 qam/40 qpm.  He will take metolazone 2.5 mg on Tuesday and again on Thursday (then stop).  BMET today and again in 1 week.  - Add KCl 20 mEq daily.  - Continue current Coreg, Entresto, and spironolactone.  - Restart hydralazine 75 mg tid and continue Imdur 120 mg daily.   - I will obtain repeat echo and CPX.   2. CAD s/p CABG: Last cath 2013 with patent grafts.  No chest pain.  - continue ASA, statin - Check lipids today.    3. HTN: BP not elevated.   4. CKD: Stage 3.  Follow closely with diuresis.  5. Murmur: Mitral area murmur on exam.  Will get echo to assess for worsening MR.   Followup in 1 week with NP/PA.  See me in 1 month.   Lovely Kerins,MD 11/12/2017

## 2017-11-14 ENCOUNTER — Other Ambulatory Visit (HOSPITAL_COMMUNITY): Payer: Self-pay | Admitting: *Deleted

## 2017-11-14 MED ORDER — ROSUVASTATIN CALCIUM 40 MG PO TABS: 40.0000 mg | ORAL_TABLET | Freq: Every day | ORAL | 3 refills | Status: DC

## 2017-11-17 NOTE — Progress Notes (Signed)
Patient ID: [place], male   DOB: 1960/11/17, 57 y.o.   MRN: [id number] PCP: Dr. Sarajane Johnson Cardiology: Dr. Aundra [place]  HPI:  [place] is a 57 y.o. male with h/o DM2, CAD s/p CABG 1027, systolic HF due to ischemic cardiomyopathy with EF 20-25% (echo 12/15), DM2 and CKD. He is s/p Medtronic single chamber ICD.  Admitted in 12/15 due to ADHF. Required short course milrinone for diuresis. Diuresed 30 pounds.   CPX 2/16 showed severely reduced functional capacity.  There was a significant disconnect between his symptoms and the CPX.  RHC in 6/16 showed fairly normal filling pressures and low but not markedly low cardiac output.   He presents today for regular follow up. At last visit torsemide increased. Planned for repeat Echo and CPX. Weight down 2 lbs. He feels slightly better, but energy level is about the same. Bendopnea somewhat improved. He has occasional dizziness, that is described as the room spinning and is not associated with standing. BP has been soft back on hydralazine. Taking all other medications as directed.   ECG (11/11/17): NSR, left axis deviation, nonspecific T wave flattening   Optivol: Personally interrogated. Fluid index above threshold. Thoracic impedence trended towards normal but leveled out. Pt activity has trended down from ~ 3 hrs to between 1-2 hrs daily.   Studies: Echo (12/15): EF 20-25% RV moderately HK CPX (2/16): RER 1.10, peak VO2 8.6, VE/VCO2 slope 43.7 => severely reduced functional capacity.  RHC (6/16): mean RA 8, PA 48/19 mean 30, mean PCWP 14, CI 2.03 Fick/2.28 Thermo, PVR 3.6.  Echo (5/17): EF 25%, mild LV dilation, moderate diastolic dysfunction, normal RV size with mild to moderate systolic dysfunction, mild mitral regurgitation  Labs:  8/15 LDL 88 12/15 K+ 4.2 creatinine 1.09  1/16 K 4.3, creatinine 1.34 4/16 K 4.5, creatinine 1.19 3/17 K 3.8, creatinine 1.22, HCT 35.3, BNP 1597 5/17 K 4.1, creatinine 1.25 6/18 LDL 138, K 4.4, creatinine  1.5  Review of systems complete and found to be negative unless listed in HPI.    SH:  Social History   Socioeconomic History  . Marital status: Single    Spouse name: Not on file  . Number of children: Not on file  . Years of education: Not on file  . Highest education level: Not on file  Occupational History  . Not on file  Social Needs  . Financial resource strain: Not on file  . Food insecurity:    Worry: Not on file    Inability: Not on file  . Transportation needs:    Medical: Not on file    Non-medical: Not on file  Tobacco Use  . Smoking status: Never Smoker  . Smokeless tobacco: Never Used  Substance and Sexual Activity  . Alcohol use: No    Alcohol/week: 0.0 oz  . Drug use: No  . Sexual activity: Yes  Lifestyle  . Physical activity:    Days per week: Not on file    Minutes per session: Not on file  . Stress: Not on file  Relationships  . Social connections:    Talks on phone: Not on file    Gets together: Not on file    Attends religious service: Not on file    Active member of club or organization: Not on file    Attends meetings of clubs or organizations: Not on file    Relationship status: Not on file  . Intimate partner violence:    Fear of current or  ex partner: Not on file    Emotionally abused: Not on file    Physically abused: Not on file    Forced sexual activity: Not on file  Other Topics Concern  . Not on file  Social History Narrative   Lives in [place] with his son and his family.  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   FH:  Family History  Problem Relation Age of Onset  . Diabetes Father        died in his 76's  . Hypertension Mother        died in her 77's - had a ppm.    Past Medical History:  Diagnosis Date  . Chronic systolic heart failure ([place])    a. Echo 5/13: Mild LVE, mild LVH, EF 10%, anteroseptal, lateral, apical AK, mild MR, mild LAE, moderately reduced RVSF, mild RAE, PASP 60;  b.  07/2014 Echo: EF 20-2%, diff HK, AKI of antsep/apical/mid-apicalinferior, mod reduced RV.  Marland Kitchen Coronary artery disease    a. s/p CABG 2002 b. [place] 5/13:  dLM 80%, LAD subtotally occluded, pCFX occluded, pRCA 50%, mid? Occlusion with high take off of the [place] with 70% multiple lesions-not bypassed and supplies collaterals to LAD, LIMA-[place] ok, [place] ok, [place] branches ok. Medical therapy was recommended  . Diabetes mellitus    type 2 requiring insulin   . Hypertension   . Ischemic cardiomyopathy    a. Echo 5/13: Mild LVE, mild LVH, EF 10%, anteroseptal, lateral, apical AK, mild MR, mild LAE, moderately reduced RVSF, mild RAE, PASP 60;  b. 01/2012 s/p [place];  c. 07/2014 Echo: EF 20-2%, diff HK, AKI of antsep/apical/mid-apicalinferior, mod reduced RV.  Marland Kitchen [place] (methicillin resistant Staphylococcus aureus)    Status Johnson right foot plantar deep infection with [place]  [place] 10/2008  . Noncompliance   . Peripheral neuropathy   . Single-chamber  implantable cardiac defibrillator - Medtronic   . Syncope     Current Outpatient Medications  Medication Sig Dispense Refill  . allopurinol (ZYLOPRIM) 300 MG tablet TAKE 1 TABLET (300 MG TOTAL) BY MOUTH DAILY. 90 tablet 3  . aspirin EC 81 MG tablet Take 1 tablet (81 mg total) by mouth daily.    Marland Kitchen BAYER BREEZE 2 TEST DISK USE TO TEST BLOOD SUGAR 3 TIMES DAILY. 100 each 1  . carvedilol (COREG) 12.5 MG tablet Take 1.5 tablets (18.75 mg total) by mouth 2 (two) times daily with a meal. 90 tablet 11  . hydrALAZINE (APRESOLINE) 50 MG tablet Take 1.5 tablets (75 mg total) by mouth 3 (three) times daily. 135 tablet 3  . Insulin Glargine (LANTUS SOLOSTAR) 100 UNIT/ML Solostar Pen INJECT 60 UNITS INTO THE SKIN DAILY AT 10 PM 15 mL 11  . Insulin Pen Needle (BD PEN NEEDLE NANO U/F) 32G X 4 MM MISC USE 4 TIMES DAILY 100 each 11  . isosorbide mononitrate (IMDUR) 120 MG 24 hr tablet Take 1 tablet (120 mg total) by mouth daily. 30 tablet 5  .  metolazone (ZAROXOLYN) 2.5 MG tablet Take 2.5 mg (1 Tab) this Tuesday and Thursday only. 2 tablet 0  . NOVOLOG FLEXPEN 100 UNIT/ML FlexPen INJECT 5 UNITS INTO THE SKIN 3 TIMES A DAY WITH MEALS 15 mL 0  . ondansetron (ZOFRAN) 8 MG tablet Take 1 tablet (8 mg total) every 8 (eight) hours as needed by mouth for nausea or vomiting. 30 tablet 0  . potassium chloride SA (  K-DUR,KLOR-CON) 20 MEQ tablet Take 1 tablet (20 mEq total) by mouth daily. 90 tablet 3  . rosuvastatin (CRESTOR) 40 MG tablet Take 1 tablet (40 mg total) by mouth daily. 30 tablet 3  . sacubitril-valsartan (ENTRESTO) 97-103 MG Take 1 tablet by mouth 2 (two) times daily. 60 tablet 11  . spironolactone (ALDACTONE) 25 MG tablet Take 1 tablet (25 mg total) by mouth daily. 30 tablet 11  . torsemide (DEMADEX) 20 MG tablet Take 4 tablets (80 mg total) by mouth every morning AND 2 tablets (40 mg total) every evening. 180 tablet 2  . torsemide (DEMADEX) 20 MG tablet Take 80 mg (4 Tabs) in the AM and 40 mg (2 Tabs) in the PM. 540 tablet 3   No current facility-administered medications for this visit.     Vitals:   11/18/17 0911  BP: 92/68  Pulse: 76  SpO2: 98%  Weight: 233 lb 12.8 oz (106.1 kg)    Wt Readings from Last 3 Encounters:  11/18/17 233 lb 12.8 oz (106.1 kg)  11/11/17 235 lb 4 oz (106.7 kg)  06/11/17 242 lb 2 oz (109.8 kg)    PHYSICAL EXAM: General: Well appearing. No resp difficulty. HEENT: Normal Neck: Supple. JVP ~10 cm. Carotids 2+ bilat; no bruits. No thyromegaly or nodule noted. Cor: PMI nondisplaced. RRR, 3/6 HSM apex.  Lungs: CTAB, normal effort. Abdomen: Soft, non-tender, non-distended, no HSM. No bruits or masses. +BS  Extremities: No cyanosis, clubbing, or rash. Trace to 1+ edema. Neuro: Alert & orientedx3, cranial nerves grossly intact. moves all 4 extremities w/o difficulty. Affect pleasant   ASSESSMENT & PLAN: 1. Chronic systolic HF:  Ischemic CMP, EF 20-25% (Echo 12/15). Repeat echo 5/17 with EF 25%.   Admit in 12/15, required short-term milrinone for low output.  Medtronic ICD, narrow QRS so not CRT candidate. Markedly abnormal CPX in 2/16 with poor functional capacity due to circulatory limitation, maximal effort. RHC in 6/16 showed near-normal filling pressures and low but not markedly low cardiac output. - NYHA III symptoms. - Volume status remains elevated on exam and Optivol.  - Continue torsemide to 80 qam/40 qpm.  Repeat BMET today. - Repeat metolazone x 1 2.5 mg dose tomorrow.   - Continue KCl 20 mEq daily.  - Continue Coreg 18.75 mg BID - Continue Entresto 97/103 - Continue spironolactone 25 mg daily. - Decrease hydralazine to 50 mg TID to make room for diuresisand continue Imdur 120 mg daily.   - Repeat repeat echo  - Plan for repeat CPX.  11/25/17. 2. CAD s/p CABG: Last cath 2013 with patent grafts.   - No s/s of ischemia.    - continue ASA, statin - LDL high 11/2017. Changed to Crestor 40 mg daily. Repeat Lipids/LFTs in June.     3. HTN:  - Meds as above.  4. CKD Stage 3: - Follow with diuresis. BMET today.  5. Murmur: Mitral area murmur on exam. - Echo pending next visit.   Keep 4 week appt with Echo.   [name] Friar, PA-C  11/17/2017  Greater than 50% of the 25 minute visit was spent in counseling/coordination of care regarding disease state education, salt/fluid restriction, sliding scale diuretics, and medication compliance.

## 2017-11-18 ENCOUNTER — Encounter (HOSPITAL_COMMUNITY): Payer: Self-pay

## 2017-11-18 ENCOUNTER — Encounter: Payer: Self-pay | Admitting: Cardiology

## 2017-11-18 ENCOUNTER — Telehealth (HOSPITAL_COMMUNITY): Payer: Self-pay | Admitting: *Deleted

## 2017-11-18 ENCOUNTER — Ambulatory Visit (HOSPITAL_COMMUNITY)
Admission: RE | Admit: 2017-11-18 | Discharge: 2017-11-18 | Disposition: A | Discharge status: Home / Self Care | Payer: BLUE CROSS/BLUE SHIELD | Source: Ambulatory Visit | Attending: Internal Medicine | Admitting: Internal Medicine

## 2017-11-18 VITALS — BP 92/68 | HR 76 | Wt 233.8 lb

## 2017-11-18 DIAGNOSIS — I130 Hypertensive heart and chronic kidney disease with heart failure and stage 1 through stage 4 chronic kidney disease, or unspecified chronic kidney disease: Secondary | ICD-10-CM | POA: Diagnosis not present

## 2017-11-18 DIAGNOSIS — Z8249 Family history of ischemic heart disease and other diseases of the circulatory system: Secondary | ICD-10-CM | POA: Diagnosis not present

## 2017-11-18 DIAGNOSIS — I10 Essential (primary) hypertension: Secondary | ICD-10-CM | POA: Diagnosis not present

## 2017-11-18 DIAGNOSIS — Z794 Long term (current) use of insulin: Secondary | ICD-10-CM | POA: Diagnosis not present

## 2017-11-18 DIAGNOSIS — Z79899 Other long term (current) drug therapy: Secondary | ICD-10-CM | POA: Insufficient documentation

## 2017-11-18 DIAGNOSIS — I5022 Chronic systolic (congestive) heart failure: Secondary | ICD-10-CM | POA: Diagnosis not present

## 2017-11-18 DIAGNOSIS — E1122 Type 2 diabetes mellitus with diabetic chronic kidney disease: Secondary | ICD-10-CM | POA: Diagnosis not present

## 2017-11-18 DIAGNOSIS — E1142 Type 2 diabetes mellitus with diabetic polyneuropathy: Secondary | ICD-10-CM | POA: Diagnosis not present

## 2017-11-18 DIAGNOSIS — I2510 Atherosclerotic heart disease of native coronary artery without angina pectoris: Secondary | ICD-10-CM | POA: Diagnosis not present

## 2017-11-18 DIAGNOSIS — I5042 Chronic combined systolic (congestive) and diastolic (congestive) heart failure: Secondary | ICD-10-CM | POA: Diagnosis not present

## 2017-11-18 DIAGNOSIS — Z7982 Long term (current) use of aspirin: Secondary | ICD-10-CM | POA: Insufficient documentation

## 2017-11-18 DIAGNOSIS — N183 Chronic kidney disease, stage 3 (moderate): Secondary | ICD-10-CM | POA: Diagnosis not present

## 2017-11-18 DIAGNOSIS — Z951 Presence of aortocoronary bypass graft: Secondary | ICD-10-CM | POA: Insufficient documentation

## 2017-11-18 DIAGNOSIS — R011 Cardiac murmur, unspecified: Secondary | ICD-10-CM | POA: Insufficient documentation

## 2017-11-18 DIAGNOSIS — Z833 Family history of diabetes mellitus: Secondary | ICD-10-CM | POA: Insufficient documentation

## 2017-11-18 DIAGNOSIS — I5020 Unspecified systolic (congestive) heart failure: Secondary | ICD-10-CM | POA: Diagnosis not present

## 2017-11-18 DIAGNOSIS — Z95810 Presence of automatic (implantable) cardiac defibrillator: Secondary | ICD-10-CM | POA: Diagnosis not present

## 2017-11-18 DIAGNOSIS — I255 Ischemic cardiomyopathy: Secondary | ICD-10-CM | POA: Insufficient documentation

## 2017-11-18 LAB — BASIC METABOLIC PANEL
Anion gap: 11 (ref 5–15)
BUN: 70 mg/dL — AB (ref 6–20)
CHLORIDE: 98 mmol/L — AB (ref 101–111)
CO2: 24 mmol/L (ref 22–32)
Calcium: 8.8 mg/dL — ABNORMAL LOW (ref 8.9–10.3)
Creatinine, Ser: 2.11 mg/dL — ABNORMAL HIGH (ref 0.61–1.24)
GFR calc Af Amer: 38 mL/min — ABNORMAL LOW (ref >60)
GFR calc non Af Amer: 33 mL/min — ABNORMAL LOW (ref >60)
GLUCOSE: 407 mg/dL — AB (ref 65–99)
Potassium: 5.1 mmol/L (ref 3.5–5.1)
Sodium: 133 mmol/L — ABNORMAL LOW (ref 135–145)

## 2017-11-18 MED ORDER — METOLAZONE 2.5 MG PO TABS: ORAL_TABLET | ORAL | 0 refills | Status: DC

## 2017-11-18 MED ORDER — HYDRALAZINE HCL 50 MG PO TABS: 50.0000 mg | ORAL_TABLET | Freq: Three times a day (TID) | ORAL | 11 refills | Status: DC

## 2017-11-18 MED ORDER — ROSUVASTATIN CALCIUM 40 MG PO TABS: 40.0000 mg | ORAL_TABLET | Freq: Every day | ORAL | 11 refills | Status: DC

## 2017-11-18 NOTE — Patient Instructions (Signed)
Routine lab work today. Will notify you of abnormal results, otherwise no news is good news!  DECREASE Hydralazine to 50 mg (1 tablet) three times daily (once every 8 hours). Set alarm on your phone to help as a reminder.  Take metolazone 2.5 mg tablet once tomorrow ONLY. You will have extra tablets in your bottle, you MUST call CHF clinic before ever taking these on an as needed basis.  Follow up as scheduled.  Take all medication as prescribed the day of your appointment. Bring all medications with you to your appointment.  Do the following things EVERYDAY: 1) Weigh yourself in the morning before breakfast. Write it down and keep it in a log. 2) Take your medicines as prescribed 3) Eat low salt foods-Limit salt (sodium) to 2000 mg per day.  4) Stay as active as you can everyday 5) Limit all fluids for the day to less than 2 liters

## 2017-11-18 NOTE — Telephone Encounter (Signed)
Result Notes for Basic metabolic panel   Notes recorded by Darron Doom, RN on 11/18/2017 at 2:30 PM EDT Patient called back and he is agreeable with plan. BMET ordered and MAR updated. ------  Notes recorded by Kerry Dory, CMA on 11/18/2017 at 2:00 PM EDT Left message for patient to call back.  [phone] (M) ------  Notes recorded by [name] Friar, PA-C on 11/18/2017 at 11:23 AM EDT Do NOT take metolazone. Hold hydralazine today and cut back to 50 mg TID as instructed. BP has been low so ? Component of ATN. He was still overloaded on exam and optivol, so would continue torsemide 80/40 for now.   Recheck BMET when he comes back for CPX next week.   Legrand Como [address]" [place], PA-C 11/18/2017 11:22 AM

## 2017-11-25 ENCOUNTER — Ambulatory Visit (HOSPITAL_COMMUNITY): Payer: BLUE CROSS/BLUE SHIELD | Attending: Internal Medicine

## 2017-11-25 DIAGNOSIS — R42 Dizziness and giddiness: Secondary | ICD-10-CM | POA: Insufficient documentation

## 2017-11-25 DIAGNOSIS — R0600 Dyspnea, unspecified: Secondary | ICD-10-CM | POA: Diagnosis not present

## 2017-11-25 DIAGNOSIS — I5042 Chronic combined systolic (congestive) and diastolic (congestive) heart failure: Secondary | ICD-10-CM | POA: Insufficient documentation

## 2017-12-02 ENCOUNTER — Emergency Department (HOSPITAL_COMMUNITY)
Admission: EM | Admit: 2017-12-02 | Discharge: 2017-12-02 | Disposition: A | Discharge status: Home / Self Care | Payer: BLUE CROSS/BLUE SHIELD | Attending: Emergency Medicine | Admitting: Emergency Medicine

## 2017-12-02 ENCOUNTER — Encounter (HOSPITAL_COMMUNITY): Payer: Self-pay

## 2017-12-02 DIAGNOSIS — Z7982 Long term (current) use of aspirin: Secondary | ICD-10-CM | POA: Insufficient documentation

## 2017-12-02 DIAGNOSIS — I5042 Chronic combined systolic (congestive) and diastolic (congestive) heart failure: Secondary | ICD-10-CM | POA: Diagnosis not present

## 2017-12-02 DIAGNOSIS — I110 Hypertensive heart disease with heart failure: Secondary | ICD-10-CM | POA: Insufficient documentation

## 2017-12-02 DIAGNOSIS — Z794 Long term (current) use of insulin: Secondary | ICD-10-CM | POA: Diagnosis not present

## 2017-12-02 DIAGNOSIS — I2510 Atherosclerotic heart disease of native coronary artery without angina pectoris: Secondary | ICD-10-CM | POA: Diagnosis not present

## 2017-12-02 DIAGNOSIS — Z79899 Other long term (current) drug therapy: Secondary | ICD-10-CM | POA: Insufficient documentation

## 2017-12-02 DIAGNOSIS — Y905 Blood alcohol level of 100-119 mg/100 ml: Secondary | ICD-10-CM | POA: Insufficient documentation

## 2017-12-02 DIAGNOSIS — R42 Dizziness and giddiness: Secondary | ICD-10-CM

## 2017-12-02 LAB — BASIC METABOLIC PANEL
Anion gap: 13 (ref 5–15)
BUN: 50 mg/dL — AB (ref 6–20)
CALCIUM: 8.9 mg/dL (ref 8.9–10.3)
CO2: 22 mmol/L (ref 22–32)
Chloride: 101 mmol/L (ref 101–111)
Creatinine, Ser: 1.47 mg/dL — ABNORMAL HIGH (ref 0.61–1.24)
GFR calc Af Amer: 59 mL/min — ABNORMAL LOW (ref >60)
GFR, EST NON AFRICAN AMERICAN: 51 mL/min — AB (ref >60)
GLUCOSE: 332 mg/dL — AB (ref 65–99)
POTASSIUM: 4.2 mmol/L (ref 3.5–5.1)
Sodium: 136 mmol/L (ref 135–145)

## 2017-12-02 LAB — URINALYSIS, ROUTINE W REFLEX MICROSCOPIC
BACTERIA UA: NONE SEEN
BILIRUBIN URINE: NEGATIVE
Glucose, UA: >=500 mg/dL — AB
Ketones, ur: NEGATIVE mg/dL
Leukocytes, UA: NEGATIVE
NITRITE: NEGATIVE
Protein, ur: 100 mg/dL — AB
SPECIFIC GRAVITY, URINE: 1.013 (ref 1.005–1.030)
pH: 5 (ref 5.0–8.0)

## 2017-12-02 LAB — CBC
HEMATOCRIT: 41.3 % (ref 39.0–52.0)
Hemoglobin: 13 g/dL (ref 13.0–17.0)
MCH: 25.8 pg — ABNORMAL LOW (ref 26.0–34.0)
MCHC: 31.5 g/dL (ref 30.0–36.0)
MCV: 82.1 fL (ref 78.0–100.0)
Platelets: 154 10*3/uL (ref 150–400)
RBC: 5.03 MIL/uL (ref 4.22–5.81)
RDW: 16.8 % — ABNORMAL HIGH (ref 11.5–15.5)
WBC: 7.6 10*3/uL (ref 4.0–10.5)

## 2017-12-02 MED ORDER — MECLIZINE HCL 25 MG PO TABS
25.0000 mg | ORAL_TABLET | Freq: Once | ORAL | Status: AC
  Administered 2017-12-02: 25 mg via ORAL
  Filled 2017-12-02: qty 1

## 2017-12-02 MED ORDER — MECLIZINE HCL 25 MG PO TABS: 25.0000 mg | ORAL_TABLET | Freq: Three times a day (TID) | ORAL | 0 refills | Status: DC | PRN

## 2017-12-02 NOTE — ED Provider Notes (Signed)
I saw and evaluated the patient, reviewed the resident's note and I agree with the findings and plan.  Pertinent History: The patient is a diabetic 57 year old male who has had over a month of intermittent vertigo type symptoms feeling like the room is spinning.  He states that it usually happens when he tries to change positions such as standing up but it does not happen every day.  When it does occur it lasts for a minute or 2 and then gradually fatigues.  He is not having symptoms at this time.  Pertinent Exam findings: On my exam the patient has a stable gait, he is able to stand without difficulty, he has no nystagmus, clear speech and normal strength in his bilateral extremities.  He does have bilateral peripheral edema but states this is chronic and is Artie on a diuretic.  The patient likely has peripheral vertigo, testing here is unremarkable, stable for discharge and follow-up with an ear nose and throat doctor.  Final diagnoses:  Vertigo      Noemi Chapel, MD 12/11/17 1008
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
## 2017-12-02 NOTE — ED Triage Notes (Signed)
Pt presents for generalized weakness and dizziness x 1 month. States has N/V worse at night. States is taking medication but it is not improving.

## 2017-12-02 NOTE — ED Notes (Signed)
Patient complaining of dizziness at this time.  Patient walked from waiting room with no problem, CAOx4.

## 2017-12-09 ENCOUNTER — Ambulatory Visit: Payer: BLUE CROSS/BLUE SHIELD | Admitting: Family Medicine

## 2017-12-09 ENCOUNTER — Encounter: Payer: Self-pay | Admitting: Family Medicine

## 2017-12-09 VITALS — BP 100/60 | HR 83 | Temp 98.4°F | Ht 70.0 in | Wt 238.0 lb

## 2017-12-09 DIAGNOSIS — T7840XA Allergy, unspecified, initial encounter: Secondary | ICD-10-CM | POA: Diagnosis not present

## 2017-12-09 DIAGNOSIS — K219 Gastro-esophageal reflux disease without esophagitis: Secondary | ICD-10-CM | POA: Diagnosis not present

## 2017-12-09 DIAGNOSIS — R238 Other skin changes: Secondary | ICD-10-CM

## 2017-12-09 DIAGNOSIS — E119 Type 2 diabetes mellitus without complications: Secondary | ICD-10-CM | POA: Diagnosis not present

## 2017-12-09 DIAGNOSIS — I509 Heart failure, unspecified: Secondary | ICD-10-CM | POA: Diagnosis not present

## 2017-12-09 DIAGNOSIS — R42 Dizziness and giddiness: Secondary | ICD-10-CM | POA: Diagnosis not present

## 2017-12-09 LAB — GLUCOSE, POCT (MANUAL RESULT ENTRY): POC GLUCOSE: 95 mg/dL (ref 70–99)

## 2017-12-09 MED ORDER — SILVER SULFADIAZINE 1 % EX CREA: 1.0000 "application " | TOPICAL_CREAM | Freq: Every day | CUTANEOUS | 2 refills | Status: DC

## 2017-12-10 ENCOUNTER — Encounter: Payer: Self-pay | Admitting: Family Medicine

## 2017-12-10 NOTE — Progress Notes (Signed)
   Subjective:    Patient ID: Marcus Johnson, male    DOB: May 19, 1961, 57 y.o.   MRN: [id number]  HPI Here to follow up an ER visit on 12-02-17 and also for blisters over the legs. On 12-02-17 he developed severe dizziness and he was diagnosed with vertigo. He was prescribed Meclizine, which he has never taken before. This helped a great deal and now the dizziness has almost resolved. However 2 days ago he had the sudden onset of swelling of his tongue and blistering of the tongue, as well as large blisters forming over both lower legs. These blisters are not painful. They all broke open and drained clear fluid. He has a hx of CHF and has chronic leg edema, but he has never had blisters like this. No SOB. He stopped taking Meclizine 2 days ago.    Review of Systems  Constitutional: Negative.   HENT: Positive for mouth sores.   Eyes: Negative.   Respiratory: Negative.   Cardiovascular: Positive for leg swelling. Negative for chest pain and palpitations.  Skin: Positive for wound.  Neurological: Positive for dizziness.       Objective:   Physical Exam  Constitutional: He appears well-developed and well-nourished.  HENT:  Mouth/Throat: Oropharynx is clear and moist.  Cardiovascular: Normal rate, regular rhythm, normal heart sounds and intact distal pulses.  Pulmonary/Chest: Effort normal and breath sounds normal. No stridor. No respiratory distress. He has no wheezes. He has no rales. He exhibits no tenderness.  Musculoskeletal:  2+ edema in both legs  Skin:  Both lower legs have large superficial areas where the skin has fallen away, with erythema and some leakage of serous fluid           Assessment & Plan:  He had an episode of vertigo which is almost resolved. He had an allergic reaction to Meclizine causing tongue swelling and blistering of the legs. We dressed the leg wounds with Silvadene and gauze, and he will dot his at home once daily. Recheck prn.  Alysia Penna, MD

## 2017-12-15 ENCOUNTER — Other Ambulatory Visit: Payer: Self-pay | Admitting: Family Medicine

## 2017-12-16 NOTE — Telephone Encounter (Signed)
Yes please refill these

## 2017-12-16 NOTE — Telephone Encounter (Signed)
Last OV 12/09/17, No Future OV  Please advise if okay to fill test strips? Not on current medication list.

## 2017-12-17 ENCOUNTER — Ambulatory Visit (INDEPENDENT_AMBULATORY_CARE_PROVIDER_SITE_OTHER): Payer: BLUE CROSS/BLUE SHIELD | Admitting: *Deleted

## 2017-12-17 DIAGNOSIS — I255 Ischemic cardiomyopathy: Secondary | ICD-10-CM | POA: Diagnosis not present

## 2017-12-17 NOTE — Progress Notes (Signed)
Remote ICD transmission.   

## 2017-12-18 LAB — CUP PACEART REMOTE DEVICE CHECK
Battery Voltage: 3.01 V
Brady Statistic RV Percent Paced: 0.01 %
Date Time Interrogation Session: 20190514052409
HIGH POWER IMPEDANCE MEASURED VALUE: 266 Ohm
HIGH POWER IMPEDANCE MEASURED VALUE: 29 Ohm
HIGH POWER IMPEDANCE MEASURED VALUE: 39 Ohm
Implantable Lead Location: 753860
Lead Channel Impedance Value: 323 Ohm
Lead Channel Pacing Threshold Amplitude: 0.875 V
Lead Channel Pacing Threshold Pulse Width: 0.4 ms
Lead Channel Sensing Intrinsic Amplitude: 7 mV
Lead Channel Setting Pacing Amplitude: 2.5 V
MDC IDC LEAD IMPLANT DT: 20130603
MDC IDC MSMT LEADCHNL RV SENSING INTR AMPL: 7 mV
MDC IDC PG IMPLANT DT: 20130603
MDC IDC SET LEADCHNL RV PACING PULSEWIDTH: 0.4 ms
MDC IDC SET LEADCHNL RV SENSING SENSITIVITY: 0.3 mV

## 2017-12-18 NOTE — Telephone Encounter (Signed)
Please call these in  

## 2017-12-19 ENCOUNTER — Other Ambulatory Visit: Payer: Self-pay | Admitting: Family Medicine

## 2017-12-19 ENCOUNTER — Ambulatory Visit: Payer: Self-pay

## 2017-12-19 ENCOUNTER — Encounter: Payer: Self-pay | Admitting: Cardiology

## 2017-12-19 NOTE — Telephone Encounter (Signed)
Patient called in with c/o "leg swelling." He says "they started swelling about 2-3 days ago, just one leg mostly and now it's so painful I can barely walk." I asked about the pain and the extent of the swelling, he says "the pain is a 10 and the swelling is from my knee down, my feet are puffy. The left leg is swollen the worse and it is painful to touch." I asked about redness, fever, he says "I can see a little redness on my left leg and I don't think I have a fever." I asked about other symptoms, he denies. According to protocol, see PCP within 24 hours, appointment scheduled for tomorrow at 1300 with Dr. Sarajane Jews, care advice given, patient verbalized understanding.   Reason for Disposition . [1] MODERATE leg swelling (e.g., swelling extends up to knees) AND [2] new onset or worsening  Answer Assessment - Initial Assessment Questions 1. ONSET: "When did the swelling start?" (e.g., minutes, hours, days)     About 2-3 days ago 2. LOCATION: "What part of the leg is swollen?"  "Are both legs swollen or just one leg?"     Left leg, both feet puffy 3. SEVERITY: "How bad is the swelling?" (e.g., localized; mild, moderate, severe)  - Localized - small area of swelling localized to one leg  - MILD pedal edema - swelling limited to foot and ankle, pitting edema < 1/4 inch (6 mm) deep, rest and elevation eliminate most or all swelling  - MODERATE edema - swelling of lower leg to knee, pitting edema > 1/4 inch (6 mm) deep, rest and elevation only partially reduce swelling  - SEVERE edema - swelling extends above knee, facial or hand swelling present      Moderate 4. REDNESS: "Does the swelling look red or infected?"     A little redness on the left leg 5. PAIN: "Is the swelling painful to touch?" If so, ask: "How painful is it?"   (Scale 1-10; mild, moderate or severe)     10-to walk and touch 6. FEVER: "Do you have a fever?" If so, ask: "What is it, how was it measured, and when did it start?"      I  don't think so 7. CAUSE: "What do you think is causing the leg swelling?"     I don't know 8. MEDICAL HISTORY: "Do you have a history of heart failure, kidney disease, liver failure, or cancer?"     Heart disease 9. RECURRENT SYMPTOM: "Have you had leg swelling before?" If so, ask: "When was the last time?" "What happened that time?"     Yes, not this big 10. OTHER SYMPTOMS: "Do you have any other symptoms?" (e.g., chest pain, difficulty breathing)       No 11. PREGNANCY: "Is there any chance you are pregnant?" "When was your last menstrual period?"       N/A  Protocols used: LEG SWELLING AND EDEMA-A-AH

## 2017-12-20 ENCOUNTER — Encounter: Payer: Self-pay | Admitting: Family Medicine

## 2017-12-20 ENCOUNTER — Ambulatory Visit (INDEPENDENT_AMBULATORY_CARE_PROVIDER_SITE_OTHER): Payer: BLUE CROSS/BLUE SHIELD | Admitting: Family Medicine

## 2017-12-20 VITALS — BP 128/82 | HR 96 | Temp 98.3°F | Wt 236.0 lb

## 2017-12-20 DIAGNOSIS — I10 Essential (primary) hypertension: Secondary | ICD-10-CM | POA: Diagnosis not present

## 2017-12-20 DIAGNOSIS — R600 Localized edema: Secondary | ICD-10-CM

## 2017-12-20 DIAGNOSIS — L03116 Cellulitis of left lower limb: Secondary | ICD-10-CM

## 2017-12-20 DIAGNOSIS — N183 Chronic kidney disease, stage 3 unspecified: Secondary | ICD-10-CM | POA: Insufficient documentation

## 2017-12-20 DIAGNOSIS — I255 Ischemic cardiomyopathy: Secondary | ICD-10-CM | POA: Diagnosis not present

## 2017-12-20 DIAGNOSIS — I5042 Chronic combined systolic (congestive) and diastolic (congestive) heart failure: Secondary | ICD-10-CM

## 2017-12-20 MED ORDER — CEPHALEXIN 500 MG PO CAPS: 500.0000 mg | ORAL_CAPSULE | Freq: Three times a day (TID) | ORAL | 0 refills | Status: DC

## 2017-12-20 NOTE — Progress Notes (Signed)
   Subjective:    Patient ID: Marcus Johnson, male    DOB: 1960-10-06, 57 y.o.   MRN: [id number]  HPI Here for 3 days of swelling in both lower legs. No recent medication or diet changes. He has been on his feet more than usual the past week because he is trying to work part time. No SOB. The left leg has been oozing clear fluid and he has some pain in it. He continues to dress them daily with Silvadene. No fever. For the past 2 days he has been taking 4 tabs of Torsemide BID, and the swelling has come down a bit.   Review of Systems  Constitutional: Negative.   Respiratory: Negative.   Cardiovascular: Positive for leg swelling. Negative for chest pain and palpitations.  Skin: Positive for color change.       Objective:   Physical Exam  Constitutional: He appears well-developed and well-nourished.  Cardiovascular: Normal rate, regular rhythm, normal heart sounds and intact distal pulses.  Pulmonary/Chest: Effort normal. No stridor. No respiratory distress. He has no wheezes.  Trace bibasilar rales   Musculoskeletal:  3+ edema in the right lower leg and 4+ in the left lower leg. The left lower leg is red and a little tender. The multiple stasis ulcers are granulating in as expected.           Assessment & Plan:  His CHF has worsened a bit this week, and we will keep the Torsemide at 80 mg bid for now. Increase the Klor-con to 20 mEq bid. Treat the cellulitis on the leg with Keflex for 10 days. On 11-30-17 his potassium was 4. 2 and creatinine was 1.47. We will see him back to recheck in one week, and we plan to repeat a BMET then.  Alysia Penna, MD

## 2017-12-24 ENCOUNTER — Encounter (HOSPITAL_COMMUNITY): Payer: BLUE CROSS/BLUE SHIELD | Admitting: Cardiology

## 2017-12-24 ENCOUNTER — Other Ambulatory Visit (HOSPITAL_COMMUNITY): Payer: BLUE CROSS/BLUE SHIELD

## 2017-12-25 ENCOUNTER — Telehealth (HOSPITAL_COMMUNITY): Payer: Self-pay | Admitting: Surgery

## 2017-12-25 NOTE — Telephone Encounter (Signed)
Mr. Sangalang returned my call and refuses to reschedule his appt until after he sees his "other doc" on Friday.  I strongly encouraged him to reschedule as soon as possible and let him know that Dr. Aundra Dubin is concerned about him.  He says that he plans to call HF clinic on Tuesday to reschedule at earliest available.

## 2017-12-25 NOTE — Telephone Encounter (Signed)
I attempted to call patient and left message in reference to reschedule appointment for today with Dr. Aundra Dubin.  I asked that Mr. Hires return the call as soon as possible.

## 2017-12-27 ENCOUNTER — Ambulatory Visit (INDEPENDENT_AMBULATORY_CARE_PROVIDER_SITE_OTHER): Payer: BLUE CROSS/BLUE SHIELD | Admitting: Family Medicine

## 2017-12-27 ENCOUNTER — Telehealth: Payer: Self-pay | Admitting: Family Medicine

## 2017-12-27 ENCOUNTER — Encounter: Payer: Self-pay | Admitting: Family Medicine

## 2017-12-27 VITALS — BP 102/70 | HR 90 | Temp 98.2°F | Ht 70.0 in | Wt 243.8 lb

## 2017-12-27 DIAGNOSIS — I5042 Chronic combined systolic (congestive) and diastolic (congestive) heart failure: Secondary | ICD-10-CM

## 2017-12-27 DIAGNOSIS — I10 Essential (primary) hypertension: Secondary | ICD-10-CM

## 2017-12-27 DIAGNOSIS — L03116 Cellulitis of left lower limb: Secondary | ICD-10-CM

## 2017-12-27 DIAGNOSIS — R600 Localized edema: Secondary | ICD-10-CM | POA: Diagnosis not present

## 2017-12-27 DIAGNOSIS — M1A9XX0 Chronic gout, unspecified, without tophus (tophi): Secondary | ICD-10-CM

## 2017-12-27 LAB — BASIC METABOLIC PANEL
BUN: 56 mg/dL — ABNORMAL HIGH (ref 6–23)
CALCIUM: 8.7 mg/dL (ref 8.4–10.5)
CO2: 25 meq/L (ref 19–32)
CREATININE: 1.5 mg/dL (ref 0.40–1.50)
Chloride: 103 mEq/L (ref 96–112)
GFR: 62.01 mL/min (ref >60.00)
Glucose, Bld: 122 mg/dL — ABNORMAL HIGH (ref 70–99)
Potassium: 4.7 mEq/L (ref 3.5–5.1)
SODIUM: 138 meq/L (ref 135–145)

## 2017-12-27 LAB — URIC ACID: Uric Acid, Serum: 8.4 mg/dL — ABNORMAL HIGH (ref 4.0–7.8)

## 2017-12-27 MED ORDER — TRAMADOL HCL 50 MG PO TABS: 100.0000 mg | ORAL_TABLET | Freq: Four times a day (QID) | ORAL | 1 refills | Status: DC | PRN

## 2017-12-27 NOTE — Addendum Note (Signed)
Addended by: Alysia Penna A on: 12/27/2017 04:32 PM   Modules accepted: Orders

## 2017-12-27 NOTE — Progress Notes (Signed)
   Subjective:    Patient ID: Marcus Johnson, male    DOB: [birth year], 57 y.o.   MRN: [id number]  HPI Here to recheck swelling and pain in both legs and for cellulitis in the left leg. He is in a fair amount of pain, whether sitting or standing. He is taking Torsemide for a total of 80 mg bid. He is on Keflex. No chest pain or SOB.    Review of Systems  Constitutional: Negative.   Respiratory: Negative.   Cardiovascular: Positive for leg swelling. Negative for chest pain and palpitations.  Skin: Positive for color change.  Neurological: Negative.        Objective:   Physical Exam  Constitutional: He appears well-developed and well-nourished. No distress.  Cardiovascular: Normal rate, regular rhythm, normal heart sounds and intact distal pulses.  Pulmonary/Chest: Effort normal and breath sounds normal. No stridor. No respiratory distress. He has no wheezes. He has no rales.  Musculoskeletal:  3+ edema in the left leg and 2+ edema in the right leg. He is tender throughout the left leg   Skin:  The left leg is pink but has much less erythema than at his last exam. Not warm           Assessment & Plan:  Leg edema with cellulitis in the left leg. Stay on Keflex and on current dosing of Torsemide. Get a BMET today. Set up venous dopplers to rule out DVT, etc. He will contact Dr. Aundra Dubin to be seen ASAP next week. Use Tramadol for pain. Elevate the legs.  Alysia Penna, MD

## 2017-12-27 NOTE — Addendum Note (Signed)
Addended by: Alysia Penna A on: 12/27/2017 05:38 PM   Modules accepted: Orders

## 2017-12-27 NOTE — Telephone Encounter (Signed)
We sent in some Tramadol

## 2017-12-27 NOTE — Telephone Encounter (Signed)
Copied from [place] [phone]. Topic: General - Other >> Dec 27, 2017  3:26 PM Yvette Rack wrote: Reason for CRM: Pt states during his appt today 12/27/17 he was told a pain medication (unsure of the name of Rx) would be called in to the pharmacy but the pharmacy advised him that no Rx was called in. Pt request Rx to be sent to [facility] [place], [place] [address] [phone] (Phone)  [phone] (Fax)

## 2017-12-27 NOTE — Telephone Encounter (Signed)
Sent to PCP to advise 

## 2017-12-31 MED ORDER — ALLOPURINOL 300 MG PO TABS: 300.0000 mg | ORAL_TABLET | Freq: Every day | ORAL | 3 refills | Status: DC

## 2017-12-31 NOTE — Addendum Note (Signed)
Addended by: Alysia Penna A on: 12/31/2017 08:09 AM   Modules accepted: Orders

## 2018-01-01 ENCOUNTER — Ambulatory Visit (HOSPITAL_COMMUNITY)
Admission: RE | Admit: 2018-01-01 | Discharge: 2018-01-01 | Disposition: A | Discharge status: Home / Self Care | Payer: BLUE CROSS/BLUE SHIELD | Source: Ambulatory Visit | Attending: Family Medicine | Admitting: Family Medicine

## 2018-01-01 DIAGNOSIS — L03116 Cellulitis of left lower limb: Secondary | ICD-10-CM | POA: Diagnosis not present

## 2018-01-01 DIAGNOSIS — R600 Localized edema: Secondary | ICD-10-CM

## 2018-01-01 DIAGNOSIS — E119 Type 2 diabetes mellitus without complications: Secondary | ICD-10-CM | POA: Insufficient documentation

## 2018-01-01 DIAGNOSIS — I509 Heart failure, unspecified: Secondary | ICD-10-CM | POA: Insufficient documentation

## 2018-01-02 ENCOUNTER — Other Ambulatory Visit: Payer: Self-pay

## 2018-01-02 MED ORDER — ALLOPURINOL 300 MG PO TABS: 600.0000 mg | ORAL_TABLET | Freq: Every day | ORAL | 3 refills | Status: DC

## 2018-01-06 ENCOUNTER — Telehealth (HOSPITAL_COMMUNITY): Payer: Self-pay

## 2018-01-06 ENCOUNTER — Other Ambulatory Visit (HOSPITAL_COMMUNITY): Payer: Self-pay

## 2018-01-06 DIAGNOSIS — I5042 Chronic combined systolic (congestive) and diastolic (congestive) heart failure: Secondary | ICD-10-CM

## 2018-01-06 NOTE — Telephone Encounter (Signed)
Called Pt to make aware that he will need to come in at 1200 pm for echo b/f appt at 2 pm with VAD. Pt aware, agreeable to plan, and no further questions at this time.

## 2018-01-07 ENCOUNTER — Ambulatory Visit (HOSPITAL_COMMUNITY)
Admission: RE | Admit: 2018-01-07 | Discharge: 2018-01-07 | Disposition: A | Discharge status: Home / Self Care | Payer: BLUE CROSS/BLUE SHIELD | Source: Ambulatory Visit | Attending: Cardiology | Admitting: Cardiology

## 2018-01-07 ENCOUNTER — Ambulatory Visit (HOSPITAL_BASED_OUTPATIENT_CLINIC_OR_DEPARTMENT_OTHER)
Admission: RE | Admit: 2018-01-07 | Discharge: 2018-01-07 | Disposition: A | Discharge status: Home / Self Care | Payer: BLUE CROSS/BLUE SHIELD | Source: Ambulatory Visit | Attending: Cardiology | Admitting: Cardiology

## 2018-01-07 ENCOUNTER — Inpatient Hospital Stay (HOSPITAL_COMMUNITY)
Admission: AD | Admit: 2018-01-07 | Discharge: 2018-01-14 | DRG: 286 | Disposition: A | Discharge status: Home / Self Care | Payer: BLUE CROSS/BLUE SHIELD | Source: Ambulatory Visit | Attending: Cardiology | Admitting: Cardiology

## 2018-01-07 ENCOUNTER — Inpatient Hospital Stay (HOSPITAL_COMMUNITY): Payer: BLUE CROSS/BLUE SHIELD

## 2018-01-07 ENCOUNTER — Other Ambulatory Visit: Payer: Self-pay

## 2018-01-07 ENCOUNTER — Inpatient Hospital Stay: Payer: Self-pay

## 2018-01-07 VITALS — BP 123/78

## 2018-01-07 DIAGNOSIS — I130 Hypertensive heart and chronic kidney disease with heart failure and stage 1 through stage 4 chronic kidney disease, or unspecified chronic kidney disease: Principal | ICD-10-CM | POA: Diagnosis present

## 2018-01-07 DIAGNOSIS — I459 Conduction disorder, unspecified: Secondary | ICD-10-CM | POA: Diagnosis present

## 2018-01-07 DIAGNOSIS — L97929 Non-pressure chronic ulcer of unspecified part of left lower leg with unspecified severity: Secondary | ICD-10-CM | POA: Diagnosis not present

## 2018-01-07 DIAGNOSIS — I361 Nonrheumatic tricuspid (valve) insufficiency: Secondary | ICD-10-CM | POA: Diagnosis not present

## 2018-01-07 DIAGNOSIS — Z8614 Personal history of Methicillin resistant Staphylococcus aureus infection: Secondary | ICD-10-CM | POA: Diagnosis not present

## 2018-01-07 DIAGNOSIS — I5023 Acute on chronic systolic (congestive) heart failure: Secondary | ICD-10-CM | POA: Diagnosis not present

## 2018-01-07 DIAGNOSIS — Z7189 Other specified counseling: Secondary | ICD-10-CM | POA: Diagnosis not present

## 2018-01-07 DIAGNOSIS — Z7982 Long term (current) use of aspirin: Secondary | ICD-10-CM

## 2018-01-07 DIAGNOSIS — E1122 Type 2 diabetes mellitus with diabetic chronic kidney disease: Secondary | ICD-10-CM | POA: Diagnosis not present

## 2018-01-07 DIAGNOSIS — I5042 Chronic combined systolic (congestive) and diastolic (congestive) heart failure: Secondary | ICD-10-CM

## 2018-01-07 DIAGNOSIS — Z01810 Encounter for preprocedural cardiovascular examination: Secondary | ICD-10-CM | POA: Diagnosis not present

## 2018-01-07 DIAGNOSIS — I110 Hypertensive heart disease with heart failure: Secondary | ICD-10-CM | POA: Insufficient documentation

## 2018-01-07 DIAGNOSIS — I255 Ischemic cardiomyopathy: Secondary | ICD-10-CM | POA: Diagnosis present

## 2018-01-07 DIAGNOSIS — K8020 Calculus of gallbladder without cholecystitis without obstruction: Secondary | ICD-10-CM | POA: Diagnosis not present

## 2018-01-07 DIAGNOSIS — Z95810 Presence of automatic (implantable) cardiac defibrillator: Secondary | ICD-10-CM

## 2018-01-07 DIAGNOSIS — Z8249 Family history of ischemic heart disease and other diseases of the circulatory system: Secondary | ICD-10-CM | POA: Diagnosis not present

## 2018-01-07 DIAGNOSIS — Z794 Long term (current) use of insulin: Secondary | ICD-10-CM

## 2018-01-07 DIAGNOSIS — Z951 Presence of aortocoronary bypass graft: Secondary | ICD-10-CM | POA: Diagnosis not present

## 2018-01-07 DIAGNOSIS — Z833 Family history of diabetes mellitus: Secondary | ICD-10-CM

## 2018-01-07 DIAGNOSIS — Z6833 Body mass index (BMI) 33.0-33.9, adult: Secondary | ICD-10-CM

## 2018-01-07 DIAGNOSIS — R1310 Dysphagia, unspecified: Secondary | ICD-10-CM | POA: Diagnosis present

## 2018-01-07 DIAGNOSIS — I2510 Atherosclerotic heart disease of native coronary artery without angina pectoris: Secondary | ICD-10-CM | POA: Diagnosis not present

## 2018-01-07 DIAGNOSIS — I5082 Biventricular heart failure: Secondary | ICD-10-CM | POA: Diagnosis present

## 2018-01-07 DIAGNOSIS — L0390 Cellulitis, unspecified: Secondary | ICD-10-CM | POA: Diagnosis not present

## 2018-01-07 DIAGNOSIS — L97919 Non-pressure chronic ulcer of unspecified part of right lower leg with unspecified severity: Secondary | ICD-10-CM | POA: Diagnosis not present

## 2018-01-07 DIAGNOSIS — R188 Other ascites: Secondary | ICD-10-CM | POA: Diagnosis not present

## 2018-01-07 DIAGNOSIS — E669 Obesity, unspecified: Secondary | ICD-10-CM | POA: Diagnosis present

## 2018-01-07 DIAGNOSIS — I252 Old myocardial infarction: Secondary | ICD-10-CM | POA: Diagnosis not present

## 2018-01-07 DIAGNOSIS — Z79899 Other long term (current) drug therapy: Secondary | ICD-10-CM | POA: Diagnosis not present

## 2018-01-07 DIAGNOSIS — Z01818 Encounter for other preprocedural examination: Secondary | ICD-10-CM

## 2018-01-07 DIAGNOSIS — Z79891 Long term (current) use of opiate analgesic: Secondary | ICD-10-CM | POA: Diagnosis not present

## 2018-01-07 DIAGNOSIS — T450X5A Adverse effect of antiallergic and antiemetic drugs, initial encounter: Secondary | ICD-10-CM | POA: Diagnosis not present

## 2018-01-07 DIAGNOSIS — I509 Heart failure, unspecified: Secondary | ICD-10-CM | POA: Diagnosis not present

## 2018-01-07 DIAGNOSIS — J90 Pleural effusion, not elsewhere classified: Secondary | ICD-10-CM | POA: Diagnosis not present

## 2018-01-07 DIAGNOSIS — I071 Rheumatic tricuspid insufficiency: Secondary | ICD-10-CM | POA: Diagnosis present

## 2018-01-07 DIAGNOSIS — E119 Type 2 diabetes mellitus without complications: Secondary | ICD-10-CM

## 2018-01-07 DIAGNOSIS — N183 Chronic kidney disease, stage 3 (moderate): Secondary | ICD-10-CM | POA: Diagnosis not present

## 2018-01-07 DIAGNOSIS — R55 Syncope and collapse: Secondary | ICD-10-CM | POA: Insufficient documentation

## 2018-01-07 DIAGNOSIS — Z515 Encounter for palliative care: Secondary | ICD-10-CM | POA: Diagnosis not present

## 2018-01-07 DIAGNOSIS — X58XXXA Exposure to other specified factors, initial encounter: Secondary | ICD-10-CM | POA: Diagnosis present

## 2018-01-07 DIAGNOSIS — E785 Hyperlipidemia, unspecified: Secondary | ICD-10-CM | POA: Diagnosis present

## 2018-01-07 DIAGNOSIS — I878 Other specified disorders of veins: Secondary | ICD-10-CM | POA: Diagnosis present

## 2018-01-07 DIAGNOSIS — I25811 Atherosclerosis of native coronary artery of transplanted heart without angina pectoris: Secondary | ICD-10-CM | POA: Diagnosis not present

## 2018-01-07 HISTORY — DX: Gout, unspecified: M10.9

## 2018-01-07 HISTORY — DX: Presence of automatic (implantable) cardiac defibrillator: Z95.810

## 2018-01-07 HISTORY — DX: Type 2 diabetes mellitus without complications: E11.9

## 2018-01-07 LAB — COMPREHENSIVE METABOLIC PANEL
ALBUMIN: 2.6 g/dL — AB (ref 3.5–5.0)
ALK PHOS: 210 U/L — AB (ref 38–126)
ALT: 22 U/L (ref 17–63)
ALT: 21 U/L (ref 17–63)
AST: 34 U/L (ref 15–41)
AST: 32 U/L (ref 15–41)
Albumin: 2.6 g/dL — ABNORMAL LOW (ref 3.5–5.0)
Alkaline Phosphatase: 209 U/L — ABNORMAL HIGH (ref 38–126)
Anion gap: 9 (ref 5–15)
Anion gap: 10 (ref 5–15)
BILIRUBIN TOTAL: 2.2 mg/dL — AB (ref 0.3–1.2)
BUN: 36 mg/dL — AB (ref 6–20)
BUN: 36 mg/dL — ABNORMAL HIGH (ref 6–20)
CALCIUM: 9.1 mg/dL (ref 8.9–10.3)
CO2: 26 mmol/L (ref 22–32)
CO2: 27 mmol/L (ref 22–32)
CREATININE: 1.44 mg/dL — AB (ref 0.61–1.24)
CREATININE: 1.45 mg/dL — AB (ref 0.61–1.24)
Calcium: 9 mg/dL (ref 8.9–10.3)
Chloride: 104 mmol/L (ref 101–111)
Chloride: 103 mmol/L (ref 101–111)
GFR calc Af Amer: >60 mL/min (ref >60)
GFR, EST NON AFRICAN AMERICAN: 53 mL/min — AB (ref >60)
GFR, EST NON AFRICAN AMERICAN: 52 mL/min — AB (ref >60)
GLUCOSE: 158 mg/dL — AB (ref 65–99)
Glucose, Bld: 180 mg/dL — ABNORMAL HIGH (ref 65–99)
POTASSIUM: 4.7 mmol/L (ref 3.5–5.1)
POTASSIUM: 4.2 mmol/L (ref 3.5–5.1)
Sodium: 139 mmol/L (ref 135–145)
Sodium: 140 mmol/L (ref 135–145)
TOTAL PROTEIN: 7.5 g/dL (ref 6.5–8.1)
TOTAL PROTEIN: 7.8 g/dL (ref 6.5–8.1)
Total Bilirubin: 2.1 mg/dL — ABNORMAL HIGH (ref 0.3–1.2)

## 2018-01-07 LAB — CBC
HEMATOCRIT: 43.1 % (ref 39.0–52.0)
HEMATOCRIT: 42.2 % (ref 39.0–52.0)
HEMOGLOBIN: 12.7 g/dL — AB (ref 13.0–17.0)
Hemoglobin: 13 g/dL (ref 13.0–17.0)
MCH: 24.6 pg — ABNORMAL LOW (ref 26.0–34.0)
MCH: 24.6 pg — ABNORMAL LOW (ref 26.0–34.0)
MCHC: 30.2 g/dL (ref 30.0–36.0)
MCHC: 30.1 g/dL (ref 30.0–36.0)
MCV: 81.5 fL (ref 78.0–100.0)
MCV: 81.6 fL (ref 78.0–100.0)
Platelets: 207 10*3/uL (ref 150–400)
Platelets: 226 10*3/uL (ref 150–400)
RBC: 5.29 MIL/uL (ref 4.22–5.81)
RBC: 5.17 MIL/uL (ref 4.22–5.81)
RDW: 16.6 % — AB (ref 11.5–15.5)
RDW: 16.5 % — AB (ref 11.5–15.5)
WBC: 7.1 10*3/uL (ref 4.0–10.5)
WBC: 7 10*3/uL (ref 4.0–10.5)

## 2018-01-07 LAB — COOXEMETRY PANEL
CARBOXYHEMOGLOBIN: 1.5 % (ref 0.5–1.5)
METHEMOGLOBIN: 1.3 % (ref 0.0–1.5)
O2 SAT: 61.4 %
Total hemoglobin: 12.5 g/dL (ref 12.0–16.0)

## 2018-01-07 LAB — URIC ACID: URIC ACID, SERUM: 5.8 mg/dL (ref 4.4–7.6)

## 2018-01-07 LAB — HEMOGLOBIN A1C
HEMOGLOBIN A1C: 9.1 % — AB (ref 4.8–5.6)
Mean Plasma Glucose: 214.47 mg/dL

## 2018-01-07 LAB — BRAIN NATRIURETIC PEPTIDE: B NATRIURETIC PEPTIDE 5: 1784.5 pg/mL — AB (ref 0.0–100.0)

## 2018-01-07 LAB — MAGNESIUM
MAGNESIUM: 2.3 mg/dL (ref 1.7–2.4)
Magnesium: 2.3 mg/dL (ref 1.7–2.4)

## 2018-01-07 LAB — GLUCOSE, CAPILLARY: GLUCOSE-CAPILLARY: 185 mg/dL — AB (ref 65–99)

## 2018-01-07 LAB — LACTATE DEHYDROGENASE: LDH: 208 U/L — ABNORMAL HIGH (ref 98–192)

## 2018-01-07 LAB — PSA: PROSTATIC SPECIFIC ANTIGEN: 22.46 ng/mL — AB (ref 0.00–4.00)

## 2018-01-07 LAB — TSH: TSH: 1.766 u[IU]/mL (ref 0.350–4.500)

## 2018-01-07 LAB — PREALBUMIN: Prealbumin: 13.6 mg/dL — ABNORMAL LOW (ref 18–38)

## 2018-01-07 MED ORDER — ENOXAPARIN SODIUM 40 MG/0.4ML ~~LOC~~ SOLN
40.0000 mg | SUBCUTANEOUS | Status: DC
  Administered 2018-01-07 – 2018-01-10 (×4): 40 mg via SUBCUTANEOUS
  Filled 2018-01-07 (×4): qty 0.4

## 2018-01-07 MED ORDER — ALLOPURINOL 300 MG PO TABS
600.0000 mg | ORAL_TABLET | Freq: Every day | ORAL | Status: DC
  Administered 2018-01-08 – 2018-01-14 (×7): 600 mg via ORAL
  Filled 2018-01-07 (×7): qty 2

## 2018-01-07 MED ORDER — CARVEDILOL 12.5 MG PO TABS
12.5000 mg | ORAL_TABLET | Freq: Two times a day (BID) | ORAL | Status: DC
  Administered 2018-01-08 – 2018-01-09 (×3): 12.5 mg via ORAL
  Filled 2018-01-07 (×3): qty 1

## 2018-01-07 MED ORDER — CARVEDILOL 6.25 MG PO TABS: 18.7500 mg | ORAL_TABLET | Freq: Two times a day (BID) | ORAL | Status: DC

## 2018-01-07 MED ORDER — SACUBITRIL-VALSARTAN 97-103 MG PO TABS
1.0000 | ORAL_TABLET | Freq: Two times a day (BID) | ORAL | Status: DC
  Administered 2018-01-07 – 2018-01-09 (×4): 1 via ORAL
  Filled 2018-01-07 (×4): qty 1

## 2018-01-07 MED ORDER — SILVER SULFADIAZINE 1 % EX CREA
1.0000 "application " | TOPICAL_CREAM | Freq: Every day | CUTANEOUS | Status: DC
  Filled 2018-01-07: qty 85

## 2018-01-07 MED ORDER — SODIUM CHLORIDE 0.9% FLUSH
3.0000 mL | Freq: Two times a day (BID) | INTRAVENOUS | Status: DC
  Administered 2018-01-09 – 2018-01-10 (×2): 3 mL via INTRAVENOUS

## 2018-01-07 MED ORDER — POTASSIUM CHLORIDE CRYS ER 20 MEQ PO TBCR
20.0000 meq | EXTENDED_RELEASE_TABLET | Freq: Two times a day (BID) | ORAL | Status: DC
  Administered 2018-01-07 – 2018-01-14 (×14): 20 meq via ORAL
  Filled 2018-01-07 (×14): qty 1

## 2018-01-07 MED ORDER — ONDANSETRON HCL 4 MG/2ML IJ SOLN
4.0000 mg | Freq: Four times a day (QID) | INTRAMUSCULAR | Status: DC | PRN
  Filled 2018-01-07: qty 2

## 2018-01-07 MED ORDER — SODIUM CHLORIDE 0.9% FLUSH
10.0000 mL | INTRAVENOUS | Status: DC | PRN
  Administered 2018-01-13: 10 mL
  Filled 2018-01-07: qty 40

## 2018-01-07 MED ORDER — FUROSEMIDE 10 MG/ML IJ SOLN
80.0000 mg | Freq: Two times a day (BID) | INTRAMUSCULAR | Status: DC
  Administered 2018-01-07 – 2018-01-08 (×2): 80 mg via INTRAVENOUS
  Filled 2018-01-07 (×2): qty 8

## 2018-01-07 MED ORDER — SPIRONOLACTONE 25 MG PO TABS
25.0000 mg | ORAL_TABLET | Freq: Every day | ORAL | Status: DC
  Administered 2018-01-08 – 2018-01-14 (×7): 25 mg via ORAL
  Filled 2018-01-07 (×7): qty 1

## 2018-01-07 MED ORDER — ALUM & MAG HYDROXIDE-SIMETH 200-200-20 MG/5ML PO SUSP
30.0000 mL | ORAL | Status: DC | PRN
  Administered 2018-01-07: 30 mL via ORAL
  Filled 2018-01-07: qty 30

## 2018-01-07 MED ORDER — ASPIRIN EC 81 MG PO TBEC
81.0000 mg | DELAYED_RELEASE_TABLET | Freq: Every day | ORAL | Status: DC
  Administered 2018-01-08 – 2018-01-14 (×7): 81 mg via ORAL
  Filled 2018-01-07 (×7): qty 1

## 2018-01-07 MED ORDER — INSULIN ASPART 100 UNIT/ML ~~LOC~~ SOLN
0.0000 [IU] | Freq: Three times a day (TID) | SUBCUTANEOUS | Status: DC
  Administered 2018-01-08 – 2018-01-10 (×4): 4 [IU] via SUBCUTANEOUS
  Administered 2018-01-11 (×2): 7 [IU] via SUBCUTANEOUS
  Administered 2018-01-11: 3 [IU] via SUBCUTANEOUS
  Administered 2018-01-12: 4 [IU] via SUBCUTANEOUS
  Administered 2018-01-12: 7 [IU] via SUBCUTANEOUS
  Administered 2018-01-13: 19 [IU] via SUBCUTANEOUS
  Administered 2018-01-13 (×2): 4 [IU] via SUBCUTANEOUS
  Administered 2018-01-14: 7 [IU] via SUBCUTANEOUS
  Administered 2018-01-14: 4 [IU] via SUBCUTANEOUS

## 2018-01-07 MED ORDER — SODIUM CHLORIDE 0.9% FLUSH
10.0000 mL | Freq: Two times a day (BID) | INTRAVENOUS | Status: DC
  Administered 2018-01-07: 10 mL
  Administered 2018-01-08: 20 mL
  Administered 2018-01-08 – 2018-01-14 (×8): 10 mL

## 2018-01-07 MED ORDER — TRAMADOL HCL 50 MG PO TABS
100.0000 mg | ORAL_TABLET | Freq: Four times a day (QID) | ORAL | Status: DC | PRN
  Administered 2018-01-07 – 2018-01-12 (×2): 100 mg via ORAL
  Filled 2018-01-07 (×2): qty 2

## 2018-01-07 MED ORDER — PERFLUTREN LIPID MICROSPHERE
1.0000 mL | INTRAVENOUS | Status: DC | PRN
  Administered 2018-01-07: 2 mL via INTRAVENOUS
  Filled 2018-01-07: qty 10

## 2018-01-07 MED ORDER — SODIUM CHLORIDE 0.9 % IV SOLN: 250.0000 mL | INTRAVENOUS | Status: DC | PRN

## 2018-01-07 MED ORDER — ACETAMINOPHEN 325 MG PO TABS: 650.0000 mg | ORAL_TABLET | ORAL | Status: DC | PRN

## 2018-01-07 MED ORDER — INSULIN ASPART 100 UNIT/ML ~~LOC~~ SOLN
0.0000 [IU] | Freq: Every day | SUBCUTANEOUS | Status: DC
  Administered 2018-01-11 – 2018-01-12 (×2): 2 [IU] via SUBCUTANEOUS

## 2018-01-07 MED ORDER — INSULIN ASPART 100 UNIT/ML ~~LOC~~ SOLN
4.0000 [IU] | Freq: Three times a day (TID) | SUBCUTANEOUS | Status: DC
  Administered 2018-01-08 – 2018-01-14 (×18): 4 [IU] via SUBCUTANEOUS

## 2018-01-07 MED ORDER — SODIUM CHLORIDE 0.9% FLUSH: 3.0000 mL | INTRAVENOUS | Status: DC | PRN

## 2018-01-07 MED ORDER — ROSUVASTATIN CALCIUM 20 MG PO TABS
40.0000 mg | ORAL_TABLET | Freq: Every day | ORAL | Status: DC
  Administered 2018-01-08 – 2018-01-14 (×7): 40 mg via ORAL
  Filled 2018-01-07 (×7): qty 2

## 2018-01-07 NOTE — Progress Notes (Signed)
Peripherally Inserted Central Catheter/Midline Placement  The IV Nurse has discussed with the patient and/or persons authorized to consent for the patient, the purpose of this procedure and the potential benefits and risks involved with this procedure.  The benefits include less needle sticks, lab draws from the catheter, and the patient may be discharged home with the catheter. Risks include, but not limited to, infection, bleeding, blood clot (thrombus formation), and puncture of an artery; nerve damage and irregular heartbeat and possibility to perform a PICC exchange if needed/ordered by physician.  Alternatives to this procedure were also discussed.  Bard Power PICC patient education guide, fact sheet on infection prevention and patient information card has been provided to patient /or left at bedside.    PICC/Midline Placement Documentation  PICC Double Lumen 67/73/73 PICC Right Basilic 41 cm 1 cm (Active)  Indication for Insertion or Continuance of Line Chronic illness with exacerbations (CF, Sickle Cell, etc.);Poor Vasculature-patient has had multiple peripheral attempts or PIVs lasting less than 24 hours 01/07/2018  5:59 PM  Exposed Catheter (cm) 1 cm 01/07/2018  5:59 PM  Site Assessment Clean;Dry;Intact 01/07/2018  5:59 PM  Lumen #1 Status Flushed;Saline locked;Blood return noted 01/07/2018  5:59 PM  Lumen #2 Status Flushed;Saline locked;Blood return noted 01/07/2018  5:59 PM  Dressing Type Transparent 01/07/2018  5:59 PM  Dressing Status Clean;Dry;Intact;Antimicrobial disc in place 01/07/2018  5:59 PM  Dressing Change Due 01/14/18 01/07/2018  5:59 PM       Gordan Payment 01/07/2018, 6:01 PM

## 2018-01-07 NOTE — Progress Notes (Signed)
Spoke with RN regarding IV consult. Noted order for PICC line placement. Agreed on plan to wait for IV access if PICC can be completed today.
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
## 2018-01-07 NOTE — Progress Notes (Signed)
  Echocardiogram 2D Echocardiogram with definity has been performed.  Marcus Johnson M 01/07/2018, 12:45 PM

## 2018-01-07 NOTE — Progress Notes (Signed)
Patient presents for VAD consult  in [facility] today. Pt is slow walking in, pt states his legs are very swollen and he has open wounds on his legs from an "allergic reaction."  Pt tells me that he wakes up during the night coughing up mucous. Once he clears his lungs he is able to go back to sleep. Pt also reports abdominal fullness and N/V for about a month. Pt states that one of the doctors he sees prescribed him Zofran. Pt states that he gets SOB after climbing a flight of stairs, while walking up an incline or if he is carrying an object. Pt states that he tries his best to stay on flat ground. Pt lives on the 3rd floor of his apartment complex, pt states that he has to take his time getting up the steps and he has to rest several times. Pt has 2-3+ pitting edema on his LE.  Pt denies orthopnea, palpitations or any shocks from his device.   Pt is currently trying to work but he states that many days he is only able to work about 4 hours a day.   Vital Signs:  Automatc BP: 123/78 (93) HR:  92  Weight: 238 lb w/o eqt Last weight: 233 lb  VAD Indication: Pt would be DT at this time.    Education: VAD evaluation consent reviewed and signed by pt and his brother. Initial VAD teaching completed with pt and caregiver(s). VAD teaching packet left at bedside for their review. Patient and caregiver(s) asked questions and had good interaction with VAD coordinator; all questions answered regarding VAD implant,  hospital stay, and what to expect when discharged home. Pt identified his brother Katheren Puller as primary caregiver(s). Explained need for 24/7 care when pt discharged home;  both pt and caregiver(s) verbalized understanding of above.   Explained that LVAD can be implanted for two indications: 1. Bridge to transplant - used for patients who cannot safely wait for heart transplant.  2. Destination therapy - used for patients until end of life or recovery of heart function.  Patient and  caregiver(s) acknowledge understanding that pt will be getting LVAD as destination therapy (until end of life) because of CKD at this time.    Device: Medtronic Therapies: On Last check: today-Optivol is off the chart  Labs:   CR: 1.44  Bili: 2.2  Prealbumin: 13.6  HgbA1c: 9.1  EKG obtained today. NSR in the 90swith PVCs  Pt is grossly volume overloaded. We will admit to 6E04 and begin VAD evaluation.   Tanda Rockers RN, BSN VAD Coordinator   Office: [phone] 24/7 Emergency VAD Pager: [phone]

## 2018-01-08 ENCOUNTER — Encounter (HOSPITAL_COMMUNITY): Payer: Self-pay | Admitting: General Practice

## 2018-01-08 ENCOUNTER — Other Ambulatory Visit: Payer: Self-pay

## 2018-01-08 ENCOUNTER — Inpatient Hospital Stay (HOSPITAL_COMMUNITY): Payer: BLUE CROSS/BLUE SHIELD

## 2018-01-08 DIAGNOSIS — I509 Heart failure, unspecified: Secondary | ICD-10-CM

## 2018-01-08 LAB — LIPID PANEL
Cholesterol: 93 mg/dL (ref 0–200)
HDL: 19 mg/dL — ABNORMAL LOW (ref >40)
LDL CALC: 54 mg/dL (ref 0–99)
Total CHOL/HDL Ratio: 4.9 RATIO
Triglycerides: 98 mg/dL (ref <150)
VLDL: 20 mg/dL (ref 0–40)

## 2018-01-08 LAB — BASIC METABOLIC PANEL
Anion gap: 4 — ABNORMAL LOW (ref 5–15)
BUN: 34 mg/dL — ABNORMAL HIGH (ref 6–20)
CHLORIDE: 108 mmol/L (ref 101–111)
CO2: 27 mmol/L (ref 22–32)
Calcium: 8.4 mg/dL — ABNORMAL LOW (ref 8.9–10.3)
Creatinine, Ser: 1.34 mg/dL — ABNORMAL HIGH (ref 0.61–1.24)
GFR calc Af Amer: >60 mL/min (ref >60)
GFR calc non Af Amer: 57 mL/min — ABNORMAL LOW (ref >60)
GLUCOSE: 177 mg/dL — AB (ref 65–99)
POTASSIUM: 4.1 mmol/L (ref 3.5–5.1)
Sodium: 139 mmol/L (ref 135–145)

## 2018-01-08 LAB — CBC WITH DIFFERENTIAL/PLATELET
ABS IMMATURE GRANULOCYTES: 0 10*3/uL (ref 0.0–0.1)
BASOS ABS: 0 10*3/uL (ref 0.0–0.1)
Basophils Relative: 0 %
Eosinophils Absolute: 0.1 10*3/uL (ref 0.0–0.7)
Eosinophils Relative: 2 %
HEMATOCRIT: 39.7 % (ref 39.0–52.0)
HEMOGLOBIN: 12 g/dL — AB (ref 13.0–17.0)
Immature Granulocytes: 0 %
LYMPHS ABS: 1.7 10*3/uL (ref 0.7–4.0)
LYMPHS PCT: 24 %
MCH: 24.8 pg — ABNORMAL LOW (ref 26.0–34.0)
MCHC: 30.2 g/dL (ref 30.0–36.0)
MCV: 82.2 fL (ref 78.0–100.0)
MONO ABS: 0.7 10*3/uL (ref 0.1–1.0)
MONOS PCT: 10 %
NEUTROS ABS: 4.4 10*3/uL (ref 1.7–7.7)
Neutrophils Relative %: 64 %
Platelets: 197 10*3/uL (ref 150–400)
RBC: 4.83 MIL/uL (ref 4.22–5.81)
RDW: 16.5 % — ABNORMAL HIGH (ref 11.5–15.5)
WBC: 6.9 10*3/uL (ref 4.0–10.5)

## 2018-01-08 LAB — COOXEMETRY PANEL
Carboxyhemoglobin: 2 % — ABNORMAL HIGH (ref 0.5–1.5)
Methemoglobin: 0.8 % (ref 0.0–1.5)
O2 Saturation: 65.1 %
Total hemoglobin: 12.5 g/dL (ref 12.0–16.0)

## 2018-01-08 LAB — GLUCOSE, CAPILLARY
GLUCOSE-CAPILLARY: 162 mg/dL — AB (ref 65–99)
GLUCOSE-CAPILLARY: 105 mg/dL — AB (ref 65–99)
GLUCOSE-CAPILLARY: 150 mg/dL — AB (ref 65–99)
Glucose-Capillary: 99 mg/dL (ref 65–99)

## 2018-01-08 LAB — HIV ANTIBODY (ROUTINE TESTING W REFLEX): HIV SCREEN 4TH GENERATION: NONREACTIVE

## 2018-01-08 LAB — MRSA PCR SCREENING: MRSA by PCR: NEGATIVE

## 2018-01-08 MED ORDER — SILVER SULFADIAZINE 1 % EX CREA
TOPICAL_CREAM | Freq: Every day | CUTANEOUS | Status: DC
  Administered 2018-01-08 – 2018-01-14 (×7): via TOPICAL
  Filled 2018-01-08: qty 85

## 2018-01-08 MED ORDER — FUROSEMIDE 10 MG/ML IJ SOLN
80.0000 mg | Freq: Three times a day (TID) | INTRAMUSCULAR | Status: DC
  Administered 2018-01-08 – 2018-01-10 (×7): 80 mg via INTRAVENOUS
  Filled 2018-01-08 (×6): qty 8

## 2018-01-08 MED ORDER — TRIAMCINOLONE ACETONIDE 0.5 % EX OINT
TOPICAL_OINTMENT | Freq: Two times a day (BID) | CUTANEOUS | Status: DC
  Administered 2018-01-08 – 2018-01-14 (×7): via TOPICAL
  Filled 2018-01-08: qty 15

## 2018-01-08 MED ORDER — METOLAZONE 5 MG PO TABS
2.5000 mg | ORAL_TABLET | Freq: Once | ORAL | Status: AC
  Administered 2018-01-08: 2.5 mg via ORAL
  Filled 2018-01-08: qty 1

## 2018-01-08 NOTE — Consult Note (Signed)
[facility] Nurse wound consult note Reason for Consult: bilateral lower leg wounds Wound type: Healing bullae from drug reaction per patient POA: Yes Measurement: Right lower leg has a wound area that measures 3 cm x 5 cm x no measurable depth.  This wound is surrounded by an area of hypopigmentation, and is in the process of re-epithelialization.  The left lower leg is largely impacted circumferentially with the same situation.  The largest area measures 11 cm x 9 cm x unknown depth.  This wound has an area of slough in the superior margin that measures approximately 2 x 4 cm.  The entire remainder of the wound areas are in the process of re-epithelialization and surrounded by hypopigmented skin.  The left leg is obviously larger than the right.  The patient states it has been this way for several weeks.  Also on the inner, upper thigh of the left leg is an erythematous patch that the patient states has been there for two days.  He states his cardiologist is aware of the area on the left thigh, and the size of his left leg.  Monitor the wound area(s) for worsening of condition such as: Signs/symptoms of infection,  Increase in size,  Development of or worsening of odor, Development of pain, or increased pain at the affected locations.  Notify the medical team if any of these develop.  Thank you for the consult.  Discussed plan of care with the patient and bedside nurse.  [facility] nurse will not follow at this time.  Please re-consult the [facility] team if needed.  Val Riles, RN, MSN, CWOCN, CNS-BC, pager [phone]

## 2018-01-08 NOTE — Progress Notes (Signed)
  Subjective: Patient examined and echocardiogram images personally reviewed and counseled patient very nice 57 year old   AA male with diabetes and history of multivessel CABG 2002 by myself. Prior to surgery he had a large anterior MI with total LAD occlusion.  He has had progressive remodeling of the LV over the years and now has severe LV dysfunction with poor performance on CPX-VO2 max 8.  Last coronary angiograms 5 years ago showed patent mammary artery and 3 out of 3 patent vein grafts.  Patient presented with anasarca, abdominal ascites, venous stasis ulcers of his legs, fatigue, and chest x-ray showing pulmonary edema-vascular congestion.  Right and left heart cath pending after fluid improved. Mixed venous saturation greater than 60% not on inotropes  Echocardiogram shows moderate TR with moderate RV dysfunction.  No significant AI.  Severe LV dysfunction.  Objective: Vital signs in last 24 hours: Temp:  [97.5 F (36.4 C)-98.8 F (37.1 C)] 97.8 F (36.6 C) (06/05 1115) Pulse Rate:  [74-94] 81 (06/05 1641) Cardiac Rhythm: Normal sinus rhythm (06/05 0730) Resp:  [14] 14 (06/04 1814) BP: (106-136)/(71-90) 115/82 (06/05 1641) SpO2:  [97 %-100 %] 97 % (06/05 1641) Weight:  [231 lb 9.6 oz (105.1 kg)-233 lb 15.7 oz (106.1 kg)] 231 lb 9.6 oz (105.1 kg) (06/05 0542)  Hemodynamic parameters for last 24 hours: CVP:  [17 mmHg-22 mmHg] 19 mmHg  Intake/Output from previous day: 06/04 0701 - 06/05 0700 In: 240 [P.O.:240] Out: 701 [Urine:700; Stool:1] Intake/Output this shift: Total I/O In: 502 [P.O.:480; Other:22] Out: 2875 [Urine:2875]       Exam    General- alert and comfortable    Neck- no JVD, no cervical adenopathy palpable, no carotid bruit   Lungs- clear without rales, wheezes   Cor- regular rate and rhythm,  2/6 holosystolic TR murmur , +[id number]   Abdomen- soft, non-tender   Extremities - warm, non-tender, severe edema with venous stasis changes   Neuro- oriented,  appropriate, no focal weakness   Lab Results: Recent Labs    01/07/18 1530 01/08/18 0533  WBC 7.0 6.9  HGB 12.7* 12.0*  HCT 42.2 39.7  PLT 226 197   BMET:  Recent Labs    01/07/18 1530 01/08/18 0533  NA 140 139  K 4.2 4.1  CL 103 108  CO2 27 27  GLUCOSE 158* 177*  BUN 36* 34*  CREATININE 1.45* 1.34*  CALCIUM 9.1 8.4*    PT/INR: No results for input(s): LABPROT, INR in the last 72 hours. ABG    Component Value Date/Time   HCO3 23.7 01/18/2015 0823   TCO2 25 01/18/2015 0823   ACIDBASEDEF 3.0 (H) 01/18/2015 0823   O2SAT 65.1 01/08/2018 0550   CBG (last 3)  Recent Labs    01/08/18 0757 01/08/18 1111 01/08/18 1635  GLUCAP 162* 105* 99    Assessment/Plan: S/P  Severe ischemic cardiomyopathy Severe right heart failure with peripheral edema, moderate TR Patient appears to be appropriate potential candidate for implantable VAD therapy and would recommend proceeding with evaluation while patient is still candidate for redo sternotomy.   LOS: 1 day    Tharon Aquas Trigt III 01/08/2018

## 2018-01-08 NOTE — Progress Notes (Addendum)
Patient ID: Marcus Johnson, male   DOB: 08/19/1960, 57 y.o.   MRN: [id number]     Advanced Heart Failure Rounding Note  PCP-Cardiologist: No primary care provider on file.   Subjective:    No complaints this morning.  He got IV Lasix last night, some UOP but not marked.  Creatinine lower at 1.34.  Still very swollen. Co-ox good at 65% this morning but CVP 20.   Echo:  Mildly dilated LV with EF 20%, moderate RV dysfunction, moderate TR, PASP 52 mmHg with dilated IVC.    Objective:   Weight Range: 231 lb 9.6 oz (105.1 kg) Body mass index is 33.23 kg/m.   Vital Signs:   Temp:  [97.7 F (36.5 C)-98.8 F (37.1 C)] 97.7 F (36.5 C) (06/05 0542) Pulse Rate:  [88-94] 88 (06/05 0542) Resp:  [14] 14 (06/04 1814) BP: (118-136)/(75-90) 120/83 (06/05 0542) SpO2:  [98 %-100 %] 98 % (06/05 0542) Weight:  [231 lb 9.6 oz (105.1 kg)-233 lb 15.7 oz (106.1 kg)] 231 lb 9.6 oz (105.1 kg) (06/05 0542)    Weight change: Filed Weights   01/07/18 1523 01/07/18 1814 01/08/18 0542  Weight: 233 lb 14.4 oz (106.1 kg) 233 lb 15.7 oz (106.1 kg) 231 lb 9.6 oz (105.1 kg)    Intake/Output:   Intake/Output Summary (Last 24 hours) at 01/08/2018 0741 Last data filed at 01/08/2018 0710 Gross per 24 hour  Intake 240 ml  Output 851 ml  Net -611 ml      Physical Exam    General:  Well appearing. No resp difficulty HEENT: Normal Neck: Supple. JVP 16+. Carotids 2+ bilat; no bruits. No lymphadenopathy or thyromegaly appreciated. Cor: PMI nondisplaced. Regular rate & rhythm. +S3.  2/6 HSM LLSB. Lungs: Clear Abdomen: Soft, nontender, nondistended. No hepatosplenomegaly. No bruits or masses. Good bowel sounds. Extremities: No cyanosis, clubbing, rash. 2+ edema to thighs with lower legs wrapped.  Neuro: Alert & orientedx3, cranial nerves grossly intact. moves all 4 extremities w/o difficulty. Affect pleasant   Telemetry   NSR with short run SVT (personally reviewed).    Labs    CBC Recent Labs   01/07/18 1530 01/08/18 0533  WBC 7.0 6.9  NEUTROABS  --  4.4  HGB 12.7* 12.0*  HCT 42.2 39.7  MCV 81.6 82.2  PLT 226 277   Basic Metabolic Panel Recent Labs    01/07/18 1408 01/07/18 1530 01/08/18 0533  NA 139 140 139  K 4.7 4.2 4.1  CL 104 103 108  CO2 26 27 27   GLUCOSE 180* 158* 177*  BUN 36* 36* 34*  CREATININE 1.44* 1.45* 1.34*  CALCIUM 9.0 9.1 8.4*  MG 2.3 2.3  --    Liver Function Tests Recent Labs    01/07/18 1408 01/07/18 1530  AST 34 32  ALT 22 21  ALKPHOS 209* 210*  BILITOT 2.2* 2.1*  PROT 7.5 7.8  ALBUMIN 2.6* 2.6*   No results for input(s): LIPASE, AMYLASE in the last 72 hours. Cardiac Enzymes No results for input(s): CKTOTAL, CKMB, CKMBINDEX, TROPONINI in the last 72 hours.  BNP: BNP (last 3 results) Recent Labs    01/07/18 1530  BNP 1,784.5*    ProBNP (last 3 results) No results for input(s): PROBNP in the last 8760 hours.   D-Dimer No results for input(s): DDIMER in the last 72 hours. Hemoglobin A1C Recent Labs    01/07/18 1409  HGBA1C 9.1*   Fasting Lipid Panel Recent Labs    01/08/18 0533  CHOL 93  HDL 19*  LDLCALC 54  TRIG 98  CHOLHDL 4.9   Thyroid Function Tests Recent Labs    01/07/18 1530  TSH 1.766    Other results:   Imaging    Dg Chest 2 View  Result Date: 01/07/2018 CLINICAL DATA:  Acute on chronic systolic heart failure, symptoms for 45 days, history coronary artery disease, ischemic cardiomyopathy, type II diabetes mellitus, hypertension EXAM: CHEST - 2 VIEW COMPARISON:  10/21/2015 FINDINGS: LEFT subclavian AICD with lead projecting at RIGHT ventricle. Enlargement of cardiac silhouette post CABG. Pulmonary vascular congestion. No definite acute edema or consolidation. No pleural effusion or pneumothorax. Bones unremarkable. IMPRESSION: Enlargement of cardiac silhouette with pulmonary vascular congestion post CABG and AICD. No acute abnormalities. Electronically Signed   By: Lavonia Dana M.D.   On:  01/07/2018 16:54   [place] Ekg Site Rite  Result Date: 01/07/2018 If Site Rite image not attached, placement could not be confirmed due to current cardiac rhythm.     Medications:     Scheduled Medications: . allopurinol  600 mg Oral Daily  . aspirin EC  81 mg Oral Daily  . carvedilol  12.5 mg Oral BID WC  . enoxaparin (LOVENOX) injection  40 mg Subcutaneous Q24H  . furosemide  80 mg Intravenous BID  . insulin aspart  0-20 Units Subcutaneous TID WC  . insulin aspart  0-5 Units Subcutaneous QHS  . insulin aspart  4 Units Subcutaneous TID WC  . metolazone  2.5 mg Oral Once  . potassium chloride SA  20 mEq Oral BID  . rosuvastatin  40 mg Oral Daily  . sacubitril-valsartan  1 tablet Oral BID  . silver sulfADIAZINE  1 application Topical Daily  . sodium chloride flush  10-40 mL Intracatheter Q12H  . sodium chloride flush  3 mL Intravenous Q12H  . spironolactone  25 mg Oral Daily     Infusions: . sodium chloride       PRN Medications:  sodium chloride, acetaminophen, alum & mag hydroxide-simeth, ondansetron (ZOFRAN) IV, sodium chloride flush, sodium chloride flush, traMADol   Assessment/Plan   1. Acute on chronic systolic CHF: Ischemic cardiomyopathy.  Has [employer] ICD.  Echo (6/19) with EF 20%, moderately dilated RV. CPX 5/19 with severe HF limitation and multiple high risk features.  NYHA class IIIb.  He remains markedly volume overloaded on exam with CVP 20 though co-ox looks reasonable at 65%.  - Continue Lasix 80 mg IV but increase to every 8 hrs and add a dose of metolazone 2.5 x 1 this morning.  - Continue current Coreg, Entresto, and spironolactone doses.  - Will need RHC/LHC this admission, would like to see some diuresis first.  - I am concerned that he may be nearing the need for advanced therapies. Will start evaluation for LVAD.  Will need to pay attention to RV and renal function.  2. CKD: Stage 3.  Creatinine up to 1.5 recently, now down to 1.34.  Suspect  cardiorenal, hopefully will improve with diuresis.  3. CAD: s/p CABG.  Last cath in 2013 with patent grafts.   - Continue ASA and statin.  - Would plan repeat angiography this admission after some diuresis.  4. Bilateral lower extremity wounds: Suspect venous stasis.   - Wound consult.  - ABIs.  5. DM: hgbA1c around 9.  Will have diabetes coordinator see.   Length of Stay: 1  Loralie Champagne, MD  01/08/2018, 7:41 AM  Advanced Heart Failure Team Pager [phone] (M-F; 7a - 4p)  Please contact [facility] for night-coverage after hours (4p -7a ) and weekends on [email or web address]

## 2018-01-08 NOTE — Progress Notes (Signed)
Inpatient Diabetes Program Recommendations  AACE/ADA: New Consensus Statement on Inpatient Glycemic Control (2015)  Target Ranges:  Prepandial:   less than 140 mg/dL      Peak postprandial:   less than 180 mg/dL (1-2 hours)      Critically ill patients:  140 - 180 mg/dL   Results for Marcus Johnson, Marcus Johnson (MRN [id number]) as of 01/08/2018 14:34  Ref. Range 01/07/2018 20:31 01/08/2018 07:57 01/08/2018 11:11  Glucose-Capillary Latest Ref Range: 65 - 99 mg/dL 185 (H) 162 (H) 105 (H)   Results for Marcus Johnson, Marcus Johnson (MRN [id number]) as of 01/08/2018 14:34  Ref. Range 01/15/2017 08:25 01/07/2018 14:09  Hemoglobin A1C Latest Ref Range: 4.8 - 5.6 % 15.3 (H) 9.1 (H)    Review of Glycemic Control  Diabetes history: DM2 Outpatient Diabetes medications: Lantus 60 units QHS, Novolog 5 units TID with meals Current orders for Inpatient glycemic control: Novolog 0-20 units TID with meals, Novolog 0-5 units QHS, Novolog 4 units TID with meals  Inpatient Diabetes Program Recommendations: HgbA1C: A1C 9.1% on 01/07/2018 indicating an average glucose of 214 mg/dl over the past 2-3 months.  NOTE: Spoke with patient about diabetes and home regimen for diabetes control. Patient reports that he is followed by PCP for diabetes management and currently he takes Lantus 60 units QHS and Novolog 5 units TID with meals (if glucose is high then he takes but if not he skips) as an outpatient for diabetes control. Patient reports that he is consistently taking Lantus but admits that sometimes he adjusts the Lantus dose based on glucose. Patient states that he does not always take the Novolog insulin because it makes his glucose drop fast.  Discussed importance of taking medications consistently as PCP prescribes and making PCP aware of what exactly he is taking at home. Patient states that over the past 1 month he has been very stressed because he was having a lot of issues with his fluid retention, has been very concerned about his left leg  swelling, and the leg wounds he developed after a reaction to Meclizine. Patient reports that he has been checking his glucose 2 times per day and it has been 100-140's mg/dl for the majority of the time over the past couple of weeks. Patient reports that he has been doing much better with following carb modified diet lately and feels that is one of the reason his glucose has been ranging better at home. Patient states that he had some issues with hypoglycemia (usually during the day) which usually occurs after taking the Novolog insulin. Reviewed hypoglycemia along with proper treatment.  Discussed A1C results (9.1% on 01/07/2018) and explained that his current A1C indicates an average glucose of 214 mg/dl over the past 2-3 months. Discussed glucose and A1C goals. Patient states that his current A1C is high but improved from his prior A1C.  Discussed importance of checking CBGs and maintaining good CBG control to prevent long-term and short-term complications.  Stressed to the patient the importance of improving glycemic control to prevent further complications from uncontrolled diabetes.   Patient verbalized understanding of information discussed and he states that he has no further questions at this time related to diabetes. Patient states that his main concern at this time is the swelling in his left leg.  Thanks, Barnie Alderman, RN, MSN, CDE Diabetes Coordinator Inpatient Diabetes Program [phone] (Team Pager)

## 2018-01-08 NOTE — Progress Notes (Signed)
Notified by CCMD Pt had short burst of SVT.  Pt was ambulating to BR, asymptomatic. Strip saved per CCMD. Will continue to monitor. Jessie Foot, RN

## 2018-01-08 NOTE — Evaluation (Signed)
Physical Therapy Evaluation Patient Details Name: Marcus Johnson MRN: [id number] DOB: [birth year] Today's Date: 01/08/2018   History of Present Illness  Pt is a 57 y/o male admitted secondary to CHF exacerbation. Chest imaging revealed enlargement of cardiac silhouette with pulmonary vascular congestion. PMH includes HTN, ischemic cardiomyopathy s/p ICD, CKD, CAD s/p CABG, and bilat LE wounds.   Clinical Impression  Pt admitted secondary to problem above with deficits below. PTA, pt was independent and still working. Pt requiring supervision for ambulation with only mild balance deficits noted. Will continue to follow acutely and ensure safety and independence with functional mobility.     Follow Up Recommendations No PT follow up;Supervision for mobility/OOB    Equipment Recommendations  3in1 (PT)    Recommendations for Other Services       Precautions / Restrictions Precautions Precautions: None Restrictions Weight Bearing Restrictions: No      Mobility  Bed Mobility               General bed mobility comments: Sitting EOB upon entry   Transfers Overall transfer level: Needs assistance Equipment used: None Transfers: Sit to/from Stand Sit to Stand: Supervision         General transfer comment: Supervision for safety.   Ambulation/Gait Ambulation/Gait assistance: Supervision Ambulation Distance (Feet): 125 Feet Assistive device: IV Pole Gait Pattern/deviations: Step-through pattern;Decreased stride length Gait velocity: Decreased    General Gait Details: Slow, cautious gait. Mild unsteadiness noted, however, no overt LOB.   Stairs            Wheelchair Mobility    Modified Rankin (Stroke Patients Only)       Balance Overall balance assessment: Needs assistance Sitting-balance support: No upper extremity supported;Feet supported Sitting balance-Leahy Scale: Good     Standing balance support: Single extremity supported;No upper extremity  supported;During functional activity Standing balance-Leahy Scale: Fair Standing balance comment: Able to maintain static standing without UE support.                              Pertinent Vitals/Pain Pain Assessment: No/denies pain    Home Living Family/patient expects to be discharged to:: Private residence Living Arrangements: Non-relatives/Friends Available Help at Discharge: Available PRN/intermittently;Friend(s) Type of Home: Apartment Home Access: Stairs to enter   [employer] of Steps: 3 flights  Home Layout: One level Home Equipment: None      Prior Function Level of Independence: Independent               Hand Dominance        Extremity/Trunk Assessment   Upper Extremity Assessment Upper Extremity Assessment: Overall WFL for tasks assessed    Lower Extremity Assessment Lower Extremity Assessment: LLE deficits/detail;RLE deficits/detail RLE Deficits / Details: Swelling noted in RLE.  LLE Deficits / Details: Swelling noted in LLE moreso than RLE.     Cervical / Trunk Assessment Cervical / Trunk Assessment: Normal  Communication   Communication: No difficulties  Cognition Arousal/Alertness: Awake/alert Behavior During Therapy: WFL for tasks assessed/performed Overall Cognitive Status: Within Functional Limits for tasks assessed                                        General Comments      Exercises     Assessment/Plan    PT Assessment Patient needs continued PT services  PT Problem  List Cardiopulmonary status limiting activity;Decreased balance;Decreased mobility       PT Treatment Interventions DME instruction;Gait training;Stair training;Therapeutic activities;Functional mobility training;Therapeutic exercise;Balance training;Patient/family education    PT Goals (Current goals can be found in the Care Plan section)  Acute Rehab PT Goals Patient Stated Goal: to go home  PT Goal Formulation: With  patient Time For Goal Achievement: 01/22/18 Potential to Achieve Goals: Good    Frequency Min 3X/week   Barriers to discharge        Co-evaluation               AM-PAC PT "6 Clicks" Daily Activity  Outcome Measure Difficulty turning over in bed (including adjusting bedclothes, sheets and blankets)?: None Difficulty moving from lying on back to sitting on the side of the bed? : None Difficulty sitting down on and standing up from a chair with arms (e.g., wheelchair, bedside commode, etc,.)?: A Little Help needed moving to and from a bed to chair (including a wheelchair)?: A Little Help needed walking in hospital room?: A Little Help needed climbing 3-5 steps with a railing? : A Lot 6 Click Score: 19    End of Session   Activity Tolerance: Patient tolerated treatment well Patient left: in bed;with call bell/phone within reach Nurse Communication: Mobility status PT Visit Diagnosis: Other abnormalities of gait and mobility (R26.89)    Time: 7517-0017 PT Time Calculation (min) (ACUTE ONLY): 17 min   Charges:   PT Evaluation $PT Eval Low Complexity: 1 Low     PT G Codes:        Leighton Ruff, PT, DPT  Acute Rehabilitation Services  Pager: [phone]    Rudean Hitt 01/08/2018, 2:22 PM

## 2018-01-09 ENCOUNTER — Inpatient Hospital Stay (HOSPITAL_COMMUNITY): Payer: BLUE CROSS/BLUE SHIELD

## 2018-01-09 ENCOUNTER — Encounter (HOSPITAL_COMMUNITY): Payer: BLUE CROSS/BLUE SHIELD

## 2018-01-09 DIAGNOSIS — Z515 Encounter for palliative care: Secondary | ICD-10-CM

## 2018-01-09 DIAGNOSIS — Z01818 Encounter for other preprocedural examination: Secondary | ICD-10-CM

## 2018-01-09 DIAGNOSIS — L0390 Cellulitis, unspecified: Secondary | ICD-10-CM

## 2018-01-09 DIAGNOSIS — Z7189 Other specified counseling: Secondary | ICD-10-CM

## 2018-01-09 LAB — BASIC METABOLIC PANEL
ANION GAP: 7 (ref 5–15)
BUN: 35 mg/dL — ABNORMAL HIGH (ref 6–20)
CALCIUM: 8.5 mg/dL — AB (ref 8.9–10.3)
CO2: 28 mmol/L (ref 22–32)
Chloride: 105 mmol/L (ref 101–111)
Creatinine, Ser: 1.27 mg/dL — ABNORMAL HIGH (ref 0.61–1.24)
Glucose, Bld: 115 mg/dL — ABNORMAL HIGH (ref 65–99)
POTASSIUM: 3.8 mmol/L (ref 3.5–5.1)
Sodium: 140 mmol/L (ref 135–145)

## 2018-01-09 LAB — ANTITHROMBIN III: ANTITHROMB III FUNC: 95 % (ref 75–120)

## 2018-01-09 LAB — CBC WITH DIFFERENTIAL/PLATELET
ABS IMMATURE GRANULOCYTES: 0 10*3/uL (ref 0.0–0.1)
BASOS ABS: 0 10*3/uL (ref 0.0–0.1)
BASOS PCT: 0 %
Eosinophils Absolute: 0.2 10*3/uL (ref 0.0–0.7)
Eosinophils Relative: 3 %
HCT: 39.2 % (ref 39.0–52.0)
HEMOGLOBIN: 11.7 g/dL — AB (ref 13.0–17.0)
IMMATURE GRANULOCYTES: 0 %
LYMPHS PCT: 28 %
Lymphs Abs: 1.8 10*3/uL (ref 0.7–4.0)
MCH: 24.6 pg — ABNORMAL LOW (ref 26.0–34.0)
MCHC: 29.8 g/dL — ABNORMAL LOW (ref 30.0–36.0)
MCV: 82.5 fL (ref 78.0–100.0)
MONOS PCT: 11 %
Monocytes Absolute: 0.7 10*3/uL (ref 0.1–1.0)
NEUTROS ABS: 3.9 10*3/uL (ref 1.7–7.7)
NEUTROS PCT: 58 %
PLATELETS: 176 10*3/uL (ref 150–400)
RBC: 4.75 MIL/uL (ref 4.22–5.81)
RDW: 16.7 % — ABNORMAL HIGH (ref 11.5–15.5)
WBC: 6.6 10*3/uL (ref 4.0–10.5)

## 2018-01-09 LAB — PROTIME-INR
INR: 1.3
Prothrombin Time: 16.1 seconds — ABNORMAL HIGH (ref 11.4–15.2)

## 2018-01-09 LAB — COOXEMETRY PANEL
CARBOXYHEMOGLOBIN: 1.5 % (ref 0.5–1.5)
Methemoglobin: 1.3 % (ref 0.0–1.5)
O2 Saturation: 62.8 %
Total hemoglobin: 12.1 g/dL (ref 12.0–16.0)

## 2018-01-09 LAB — GLUCOSE, CAPILLARY
GLUCOSE-CAPILLARY: 105 mg/dL — AB (ref 65–99)
Glucose-Capillary: 120 mg/dL — ABNORMAL HIGH (ref 65–99)
Glucose-Capillary: 167 mg/dL — ABNORMAL HIGH (ref 65–99)
Glucose-Capillary: 96 mg/dL (ref 65–99)
Glucose-Capillary: 137 mg/dL — ABNORMAL HIGH (ref 65–99)

## 2018-01-09 MED ORDER — SACUBITRIL-VALSARTAN 49-51 MG PO TABS
1.0000 | ORAL_TABLET | Freq: Two times a day (BID) | ORAL | Status: DC
  Administered 2018-01-09 – 2018-01-14 (×10): 1 via ORAL
  Filled 2018-01-09 (×10): qty 1

## 2018-01-09 MED ORDER — METOLAZONE 5 MG PO TABS
2.5000 mg | ORAL_TABLET | Freq: Once | ORAL | Status: AC
  Administered 2018-01-09: 2.5 mg via ORAL
  Filled 2018-01-09: qty 1

## 2018-01-09 MED ORDER — ASPIRIN 81 MG PO CHEW
81.0000 mg | CHEWABLE_TABLET | ORAL | Status: AC
  Administered 2018-01-10: 81 mg via ORAL
  Filled 2018-01-09: qty 1

## 2018-01-09 MED ORDER — SODIUM CHLORIDE 0.9 % IV SOLN: INTRAVENOUS | Status: DC

## 2018-01-09 MED ORDER — SODIUM CHLORIDE 0.9 % IV SOLN: 250.0000 mL | INTRAVENOUS | Status: DC | PRN

## 2018-01-09 MED ORDER — SODIUM CHLORIDE 0.9% FLUSH: 3.0000 mL | INTRAVENOUS | Status: DC | PRN

## 2018-01-09 MED ORDER — CARVEDILOL 6.25 MG PO TABS
6.2500 mg | ORAL_TABLET | Freq: Two times a day (BID) | ORAL | Status: DC
  Administered 2018-01-09 – 2018-01-12 (×6): 6.25 mg via ORAL
  Filled 2018-01-09 (×6): qty 1

## 2018-01-09 MED ORDER — SODIUM CHLORIDE 0.9% FLUSH: 3.0000 mL | Freq: Two times a day (BID) | INTRAVENOUS | Status: DC

## 2018-01-09 NOTE — Progress Notes (Signed)
ABI's have been completed. Unable to obtain ABI's bilaterally due to wounds and bandages. TBI's  Right 0.5 Left 0.5  01/09/18 4:09 PM Marcus Johnson RVT

## 2018-01-09 NOTE — Progress Notes (Signed)
Physical Therapy Treatment Patient Details Name: Marcus Johnson MRN: [id number] DOB: 03-08-1961 Today's Date: 01/09/2018    History of Present Illness Pt is a 57 y/o male admitted secondary to CHF exacerbation. Chest imaging revealed enlargement of cardiac silhouette with pulmonary vascular congestion. PMH includes HTN, ischemic cardiomyopathy s/p ICD, CKD, CAD s/p CABG, and bilat LE wounds.     PT Comments    Pt admitted with above diagnosis. Pt currently with functional limitations due to balance and endurance deficits. Pt was able to ambulate to stairwell however did get dizzy after descending 19 steps and has ~ 36 steps to get into his apartment.  Continue PT and progress pt as able.  Pt will benefit from skilled PT to increase their independence and safety with mobility to allow discharge to the venue listed below.     Follow Up Recommendations  Supervision for mobility/OOB;Home health PT(safety eval)     Equipment Recommendations  3in1 (PT)    Recommendations for Other Services       Precautions / Restrictions Precautions Precautions: None Restrictions Weight Bearing Restrictions: No    Mobility  Bed Mobility               General bed mobility comments: Sitting EOB upon entry   Transfers Overall transfer level: Needs assistance Equipment used: None Transfers: Sit to/from Stand Sit to Stand: Supervision         General transfer comment: Supervision for safety.   Ambulation/Gait Ambulation/Gait assistance: Supervision Ambulation Distance (Feet): 250 Feet Assistive device: None Gait Pattern/deviations: Step-through pattern;Decreased stride length Gait velocity: Decreased  Gait velocity interpretation: 1.31 - 2.62 ft/sec, indicative of limited community ambulator General Gait Details: Slow, cautious gait. No overt LOB.    Stairs Stairs: Yes Stairs assistance: Min guard Stair Management: Two rails;Alternating pattern;Forwards Number of Stairs:  19 General stair comments: Pt reported dizziness after descending 19 steps.  Decided to ride the elevator back up to floor as pt BPs have been low.  Pt has 3 flights of stairs to get into home wil need to continue to practice.    Wheelchair Mobility    Modified Rankin (Stroke Patients Only)       Balance Overall balance assessment: Needs assistance Sitting-balance support: No upper extremity supported;Feet supported Sitting balance-Leahy Scale: Good     Standing balance support: Single extremity supported;No upper extremity supported;During functional activity Standing balance-Leahy Scale: Fair Standing balance comment: Able to maintain static standing without UE support.                             Cognition Arousal/Alertness: Awake/alert Behavior During Therapy: WFL for tasks assessed/performed Overall Cognitive Status: Within Functional Limits for tasks assessed                                        Exercises      General Comments        Pertinent Vitals/Pain Pain Assessment: No/denies pain  BP initially 98/64 with HR 84 bpm; After walk, pt BP 104/62 andHR 89 bpm.   Home Living                      Prior Function            PT Goals (current goals can now be found in the care plan section) Acute Rehab  PT Goals Patient Stated Goal: to go home  Progress towards PT goals: Progressing toward goals    Frequency    Min 3X/week      PT Plan Discharge plan needs to be updated    Co-evaluation              AM-PAC PT "6 Clicks" Daily Activity  Outcome Measure  Difficulty turning over in bed (including adjusting bedclothes, sheets and blankets)?: None Difficulty moving from lying on back to sitting on the side of the bed? : None Difficulty sitting down on and standing up from a chair with arms (e.g., wheelchair, bedside commode, etc,.)?: A Little Help needed moving to and from a bed to chair (including a  wheelchair)?: A Little Help needed walking in hospital room?: A Little Help needed climbing 3-5 steps with a railing? : A Lot 6 Click Score: 19    End of Session Equipment Utilized During Treatment: Gait belt Activity Tolerance: Patient limited by fatigue(limited by dizziness with descending stairs) Patient left: in bed;with call bell/phone within reach(sitting at EOB) Nurse Communication: Mobility status PT Visit Diagnosis: Other abnormalities of gait and mobility (R26.89)     Time: 1010-1044 PT Time Calculation (min) (ACUTE ONLY): 34 min  Charges:  $Gait Training: 23-37 mins                    G Codes:       [place] [phone] (pager)    Denice Paradise 01/09/2018, 2:10 PM

## 2018-01-09 NOTE — Progress Notes (Signed)
Nutrition Consult - Brief Note  Received MD consult for VAD evaluation.   Patient reports that he has been eating well. He has been consuming 100% of meals since admission. He has gained weight due to fluid overload, currently ~10 lbs above his usual weight. Nutrition focused physical exam completed.  No muscle or subcutaneous fat depletion noticed. He is concerned about "getting all this fluid off my leg."   Discussed heart healthy diet guidelines and encouraged patient to follow a low sodium, low fat diet including a lot of fresh vegetables and whole grains. Patient declined further education, stating that he has a Art therapist of information about heart healthy diets and he knows what he should do, he just doesn't do it.   Body mass index is 32.51 kg/m. Patient meets criteria for obesity, class 1 based on current BMI.   Current diet order is heart healthy CHO modified, patient is consuming approximately 100% of meals at this time. Labs and medications reviewed.   No further nutrition interventions warranted at this time. If nutrition issues arise, please consult RD.   Molli Barrows, RD, LDN, [place] Pager [phone] After Hours Pager [phone]

## 2018-01-09 NOTE — H&P (View-Only) (Signed)
Patient ID: Marcus Johnson, male   DOB: April 26, 1961, 57 y.o.   MRN: [id number] Patient ID: Marcus Johnson, male   DOB: [birth year], 57 y.o.   MRN: [id number]     Advanced Heart Failure Rounding Note  PCP-Cardiologist: No primary care provider on file.   Subjective:    No complaints this morning.  Good urine output yesterday with addition of metolazone.  CVP 15 with co-ox 63%.  Still very edematous.   Echo:  Mildly dilated LV with EF 20%, moderate RV dysfunction, moderate TR, PASP 52 mmHg with dilated IVC.    Objective:   Weight Range: 226 lb 9.6 oz (102.8 kg) Body mass index is 32.51 kg/m.   Vital Signs:   Temp:  [97.7 F (36.5 C)-98.2 F (36.8 C)] 98 F (36.7 C) (06/06 0819) Pulse Rate:  [74-89] 87 (06/06 0819) Resp:  [18-20] 20 (06/06 0432) BP: (96-115)/(59-82) 107/80 (06/06 0819) SpO2:  [96 %-100 %] 99 % (06/06 0819) Weight:  [226 lb 9.6 oz (102.8 kg)] 226 lb 9.6 oz (102.8 kg) (06/06 0432) Last BM Date: 01/07/18  Weight change: Filed Weights   01/07/18 1814 01/08/18 0542 01/09/18 0432  Weight: 233 lb 15.7 oz (106.1 kg) 231 lb 9.6 oz (105.1 kg) 226 lb 9.6 oz (102.8 kg)    Intake/Output:   Intake/Output Summary (Last 24 hours) at 01/09/2018 0937 Last data filed at 01/09/2018 0531 Gross per 24 hour  Intake 430 ml  Output 4625 ml  Net -4195 ml      Physical Exam    General: NAD Neck: JVP 14+, no thyromegaly or thyroid nodule.  Lungs: Clear to auscultation bilaterally with normal respiratory effort. CV: Nondisplaced PMI.  Heart regular S1/S2, soft S3, 2/6 HSM LLSB.  2+ edema to thighs.   Abdomen: Soft, nontender, no hepatosplenomegaly, no distention.  Skin: Intact without lesions or rashes.  Neurologic: Alert and oriented x 3.  Psych: Normal affect. Extremities: No clubbing or cyanosis. Lower legs wrapped with ulcers HEENT: Normal.   Telemetry   NSR 80s (personally reviewed).    Labs    CBC Recent Labs    01/08/18 0533 01/09/18 0412  WBC 6.9 6.6    NEUTROABS 4.4 3.9  HGB 12.0* 11.7*  HCT 39.7 39.2  MCV 82.2 82.5  PLT 197 491   Basic Metabolic Panel Recent Labs    01/07/18 1408 01/07/18 1530 01/08/18 0533 01/09/18 0412  NA 139 140 139 140  K 4.7 4.2 4.1 3.8  CL 104 103 108 105  CO2 26 27 27 28   GLUCOSE 180* 158* 177* 115*  BUN 36* 36* 34* 35*  CREATININE 1.44* 1.45* 1.34* 1.27*  CALCIUM 9.0 9.1 8.4* 8.5*  MG 2.3 2.3  --   --    Liver Function Tests Recent Labs    01/07/18 1408 01/07/18 1530  AST 34 32  ALT 22 21  ALKPHOS 209* 210*  BILITOT 2.2* 2.1*  PROT 7.5 7.8  ALBUMIN 2.6* 2.6*   No results for input(s): LIPASE, AMYLASE in the last 72 hours. Cardiac Enzymes No results for input(s): CKTOTAL, CKMB, CKMBINDEX, TROPONINI in the last 72 hours.  BNP: BNP (last 3 results) Recent Labs    01/07/18 1530  BNP 1,784.5*    ProBNP (last 3 results) No results for input(s): PROBNP in the last 8760 hours.   D-Dimer No results for input(s): DDIMER in the last 72 hours. Hemoglobin A1C Recent Labs    01/07/18 1409  HGBA1C 9.1*   Fasting Lipid Panel Recent Labs  01/08/18 0533  CHOL 93  HDL 19*  LDLCALC 54  TRIG 98  CHOLHDL 4.9   Thyroid Function Tests Recent Labs    01/07/18 1530  TSH 1.766    Other results:   Imaging    Dg Orthopantogram  Result Date: 01/08/2018 CLINICAL DATA:  Tricuspid regurgitation EXAM: ORTHOPANTOGRAM/PANORAMIC COMPARISON:  None. FINDINGS: Panoramic view of the mandible was obtained and reveals dental caries involving multiple teeth in both the maxilla and mandible. Some periapical lucency is noted surrounding the third right mandibular molar. Questionable similar changes are noted surrounding the root of the left third mandibular molar. IMPRESSION: Dental caries with changes periapical lucency surrounding the third maxillary molars bilaterally this is more marked on the right than the left. Electronically Signed   By: Inez Catalina M.D.   On: 01/08/2018 21:22      Medications:     Scheduled Medications: . allopurinol  600 mg Oral Daily  . aspirin EC  81 mg Oral Daily  . carvedilol  6.25 mg Oral BID WC  . enoxaparin (LOVENOX) injection  40 mg Subcutaneous Q24H  . furosemide  80 mg Intravenous Q8H  . insulin aspart  0-20 Units Subcutaneous TID WC  . insulin aspart  0-5 Units Subcutaneous QHS  . insulin aspart  4 Units Subcutaneous TID WC  . potassium chloride SA  20 mEq Oral BID  . rosuvastatin  40 mg Oral Daily  . sacubitril-valsartan  1 tablet Oral BID  . silver sulfADIAZINE   Topical Daily  . sodium chloride flush  10-40 mL Intracatheter Q12H  . sodium chloride flush  3 mL Intravenous Q12H  . spironolactone  25 mg Oral Daily  . triamcinolone ointment   Topical BID    Infusions: . sodium chloride      PRN Medications: sodium chloride, acetaminophen, alum & mag hydroxide-simeth, ondansetron (ZOFRAN) IV, sodium chloride flush, sodium chloride flush, traMADol   Assessment/Plan   1. Acute on chronic systolic CHF: Ischemic cardiomyopathy.  Has Medtronic ICD.  Echo (6/19) with EF 20%, moderately dilated RV. CPX 5/19 with severe HF limitation and multiple high risk features.  NYHA class IIIb.  He remains markedly volume overloaded by exam with CVP 15 though co-ox looks reasonable at 63%. Good diuresis yesterday.  - Continue Lasix 80 mg IV every 8 hrs and add a dose of metolazone 2.5 x 1 again.   - With soft BP, will decrease Coreg and Entresto doses.  Continue current spironolactone.   - Will need RHC/LHC this admission, probably tomorrow if he remains stable.  Discussed risks/benefits with patient and he agrees to procedure.  - I am concerned that he may be nearing the need for advanced therapies. We are starting LVAD evaluation.  Will need to pay attention to RV and renal function. Dr. Prescott Gum has seen.  2. CKD: Stage 3.  Creatinine up to 1.5 recently, now down to 1.27.  Suspect cardiorenal, hopefully will continue to improve with  diuresis.  3. CAD: s/p CABG.  Last cath in 2013 with patent grafts.   - Continue ASA and statin.  - Repeat angiography tomorrow if he remains stable.   4. Bilateral lower extremity wounds: Suspect venous stasis versus drug reaction from ?meclizine.   - Wound consult has seen.  - ABIs.  - Given some asymmetric lower leg swelling will also order venous dopplers.  5. DM: hgbA1c around 9.  Diabetes coordinator following.  Glucose reasonably controlled currently.   Length of Stay: 2  [place]  Aundra Dubin, MD  01/09/2018, 9:37 AM  Advanced Heart Failure Team Pager [phone] (M-F; 7a - 4p)  Please contact [facility] for night-coverage after hours (4p -7a ) and weekends on [email or web address]

## 2018-01-09 NOTE — Consult Note (Addendum)
Consultation Note Date: 01/09/2018   Patient Name: Marcus Johnson  DOB: 22-Nov-1960  MRN: [id number]  Age / Sex: 57 y.o., male  PCP: Marcus Morale, MD Referring Physician: Larey Dresser, MD  Reason for Consultation: Establishing goals of care  HPI/Patient Profile: Marcus Johnson is a30 y.o.male with h/o DM2, CAD s/p CABG 1771, systolic HF due to ischemic cardiomyopathy with EF 20-25% (Echo 12/15), DM2 and CKD. He is s/p Medtronic single chamber ICD.    Clinical Assessment and Goals of Care: Patient is sitting on the side of the bed. 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 He states his job involves physical work and climbing. He states he loves to spend time with his grandchildren, and plays actively with them. He is involved with many church activities. He states occasionally he would have SOB or chest pain, but he had a good life.  He states a month ago he developed what he thought was vertigo. He was prescribed medication and over that weekened he progressively had tongue swelling, difficulty swallowing, extremity swelling, and large blisters on his legs that ruptured. He went to see his doctor the next week.  He states he noticed the dizziness after taking his BP pills and was told he was on too much medicine. He states he still had the fluid and has been trying to get rid of it.   He states he understands his heart is bad. He states he failed stress tests 10 years ago and 5 years ago. He states his issues that brought him here began following the prescribed medication, and he wants the quality of life he had prior to that medication. He tells me he was offered a heart pump or heart transplant 2 years ago and decided on transplant, and went on the wait list. While he was on the list, his heart felt stronger, and he became as active as he wanted to be, so he took his name off  the list. He states he is not totally against the LVAD, but he is not sure it is for him at this time. He is a man of faith and is trusting God to help him through this and through decisions.  He is hopeful there is something that can be corrected in the heart cath tomorrow.       SUMMARY OF RECOMMENDATIONS    Will continue to follow. Plans for cardiac cath tomorrow.    Code Status/Advance Care Planning:  Full code    Symptom Management:   Per primary team.   Palliative Prophylaxis:   Eye Care and Oral Care  Prognosis:   Unable to determine  Discharge Planning: To Be Determined      Primary Diagnoses: Present on Admission: . Acute on chronic systolic (congestive) heart failure ([place])   I have reviewed the medical record, interviewed the patient and family, and examined the patient. The following aspects are pertinent.  Past Medical History:  Diagnosis Date  . AICD (automatic  cardioverter/defibrillator) present    Single-chamber  implantable cardiac defibrillator - Medtronic  . CHF (congestive heart failure) ([place])   . Chronic systolic heart failure ([place])    a. Echo 5/13: Mild LVE, mild LVH, EF 10%, anteroseptal, lateral, apical AK, mild MR, mild LAE, moderately reduced RVSF, mild RAE, PASP 60;  b. 07/2014 Echo: EF 20-2%, diff HK, AKI of antsep/apical/mid-apicalinferior, mod reduced RV.  Marland Kitchen Coronary artery disease    a. s/p CABG 2002 b. LHC 5/13:  dLM 80%, LAD subtotally occluded, pCFX occluded, pRCA 50%, mid? Occlusion with high take off of the PDA with 70% multiple lesions-not bypassed and supplies collaterals to LAD, LIMA-IM/ramus ok, S-OM ok, S-PLA branches ok. Medical therapy was recommended  . Gout    "on daily RX" (01/08/2018)  . Hypertension   . Ischemic cardiomyopathy    a. Echo 5/13: Mild LVE, mild LVH, EF 10%, anteroseptal, lateral, apical AK, mild MR, mild LAE, moderately reduced RVSF, mild RAE, PASP 60;  b. 01/2012 s/p MDT [id number] Protecta XT VR AICD;  c.  07/2014 Echo: EF 20-2%, diff HK, AKI of antsep/apical/mid-apicalinferior, mod reduced RV.  Marland Kitchen MRSA (methicillin resistant Staphylococcus aureus)    Status post right foot plantar deep infection with MRSA status post  I&D 10/2008  . Myocardial infarction [facility])    "was told I'd had several before heart OR in 2002" (01/08/2018)  . Noncompliance   . Peripheral neuropathy   . Syncope   . Type II diabetes mellitus ([place])    requiring insulin    Social History   Socioeconomic History  . Marital status: Divorced    Spouse name: Not on file  . Number of children: Not on file  . Years of education: Not on file  . Highest education level: Not on file  Occupational History  . Not on file  Social Needs  . Financial resource strain: Not on file  . Food insecurity:    Worry: Not on file    Inability: Not on file  . Transportation needs:    Medical: Not on file    Non-medical: Not on file  Tobacco Use  . Smoking status: Never Smoker  . Smokeless tobacco: Never Used  Substance and Sexual Activity  . Alcohol use: No    Alcohol/week: 0.0 oz  . Drug use: No  . Sexual activity: Not on file  Lifestyle  . Physical activity:    Days per week: Not on file    Minutes per session: Not on file  . Stress: Not on file  Relationships  . Social connections:    Talks on phone: Not on file    Gets together: Not on file    Attends religious service: Not on file    Active member of club or organization: Not on file    Attends meetings of clubs or organizations: Not on file    Relationship status: Not on file  Other Topics Concern  . Not on file  Social History Narrative   Lives in [place] with his son and his family.  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   Family History  Problem Relation Age of Onset  . Diabetes Father        died in his 49's  . Hypertension Mother        died in her 39's - had a ppm.   Scheduled Meds: . allopurinol  600 mg Oral Daily  . aspirin  EC  81 mg Oral  Daily  . carvedilol  6.25 mg Oral BID WC  . enoxaparin (LOVENOX) injection  40 mg Subcutaneous Q24H  . furosemide  80 mg Intravenous Q8H  . insulin aspart  0-20 Units Subcutaneous TID WC  . insulin aspart  0-5 Units Subcutaneous QHS  . insulin aspart  4 Units Subcutaneous TID WC  . potassium chloride SA  20 mEq Oral BID  . rosuvastatin  40 mg Oral Daily  . sacubitril-valsartan  1 tablet Oral BID  . silver sulfADIAZINE   Topical Daily  . sodium chloride flush  10-40 mL Intracatheter Q12H  . sodium chloride flush  3 mL Intravenous Q12H  . spironolactone  25 mg Oral Daily  . triamcinolone ointment   Topical BID   Continuous Infusions: . sodium chloride     PRN Meds:.sodium chloride, acetaminophen, alum & mag hydroxide-simeth, ondansetron (ZOFRAN) IV, sodium chloride flush, sodium chloride flush, traMADol Medications Prior to Admission:  Prior to Admission medications   Medication Sig Start Date End Date Taking? Authorizing Provider  allopurinol (ZYLOPRIM) 300 MG tablet Take 2 tablets (600 mg total) by mouth daily. 01/02/18  Yes Marcus Morale, MD  aspirin EC 325 MG tablet Take 124 mg by mouth daily.  01/26/13  Yes Deboraha Sprang, MD  carvedilol (COREG) 12.5 MG tablet Take 1.5 tablets (18.75 mg total) by mouth 2 (two) times daily with a meal. 01/15/17  Yes Marcus Morale, MD  Insulin Glargine (LANTUS SOLOSTAR) 100 UNIT/ML Solostar Pen INJECT 60 UNITS INTO THE SKIN DAILY AT 10 PM 01/15/17  Yes Marcus Morale, MD  NOVOLOG FLEXPEN 100 UNIT/ML FlexPen INJECT 5 UNITS INTO THE SKIN 3 TIMES A DAY WITH MEALS Patient taking differently: INJECT 5 UNITS INTO THE SKIN 3 TIMES A DAY WITH MEALS sometime prn 10/18/17  Yes Marcus Morale, MD  ondansetron (ZOFRAN) 8 MG tablet Take 1 tablet (8 mg total) every 8 (eight) hours as needed by mouth for nausea or vomiting. 06/11/17  Yes Marcus Morale, MD  potassium chloride SA (K-DUR,KLOR-CON) 20 MEQ tablet Take 1 tablet (20 mEq total) by mouth  daily. Patient taking differently: Take 20 mEq by mouth 2 (two) times daily.  11/11/17  Yes Marcus Dresser, MD  rosuvastatin (CRESTOR) 40 MG tablet Take 1 tablet (40 mg total) by mouth daily. 11/18/17 02/16/18 Yes Marcus Dresser, MD  sacubitril-valsartan (ENTRESTO) 97-103 MG Take 1 tablet by mouth 2 (two) times daily. 01/15/17  Yes Marcus Morale, MD  silver sulfADIAZINE (SILVADENE) 1 % cream Apply 1 application topically daily. 12/09/17  Yes Marcus Morale, MD  spironolactone (ALDACTONE) 25 MG tablet Take 1 tablet (25 mg total) by mouth daily. 01/15/17  Yes Marcus Morale, MD  torsemide (DEMADEX) 20 MG tablet Take 80 mg (4 Tabs) in the AM and 40 mg (2 Tabs) in the PM. Patient taking differently: Take 80 mg (4 Tabs) in the AM and 80 mg (4 Tabs) in the PM. 11/11/17  Yes Marcus Dresser, MD  traMADol (ULTRAM) 50 MG tablet Take 2 tablets (100 mg total) by mouth every 6 (six) hours as needed for moderate pain. 12/27/17  Yes Marcus Morale, MD  BREEZE 2 TEST DISK USE TO TEST BLOOD SUGAR 3 TIMES DAILY. 12/18/17   Marcus Morale, MD  cephALEXin (KEFLEX) 500 MG capsule Take 1 capsule (500 mg total) by mouth 3 (three) times daily. Patient not taking: Reported on 01/09/2018 12/20/17   Marcus Morale, MD  hydrALAZINE (APRESOLINE) 50  MG tablet Take 1 tablet (50 mg total) by mouth 3 (three) times daily. Patient not taking: Reported on 01/07/2018 11/18/17   [name] Friar, PA-C  isosorbide mononitrate (IMDUR) 120 MG 24 hr tablet Take 1 tablet (120 mg total) by mouth daily. Patient not taking: Reported on 01/09/2018 10/25/16   Marcus Dresser, MD  furosemide (LASIX) 40 MG tablet TAKE 3 TABLETS BY MOUTH EVERY MORNING AND 2 TABLETS BY MOUTH EVERY EVENING 12/05/15 12/12/15  Marcus Dresser, MD   Allergies  Allergen Reactions  . Meclizine Hcl Anaphylaxis and Swelling   Review of Systems  Cardiovascular: Positive for leg swelling.  Skin: Positive for wound.    Physical Exam  Constitutional: No distress.   Pulmonary/Chest: Effort normal.  Neurological: He is alert.  Oriented  Skin: Skin is warm and dry.    Vital Signs: BP 95/62 (BP Location: Left Arm)   Pulse 83   Temp 98.2 F (36.8 C) (Oral)   Resp 20   Ht 5\' 10"  (1.778 m)   Wt 102.8 kg (226 lb 9.6 oz)   SpO2 97%   BMI 32.51 kg/m  Pain Scale: 0-10   Pain Score: 0-No pain   SpO2: SpO2: 97 % O2 Device:SpO2: 97 % O2 Flow Rate: .   IO: Intake/output summary:   Intake/Output Summary (Last 24 hours) at 01/09/2018 1510 Last data filed at 01/09/2018 1259 Gross per 24 hour  Intake 190 ml  Output 5250 ml  Net -5060 ml    LBM: Last BM Date: 01/07/18 Baseline Weight: Weight: 106.1 kg (233 lb 14.4 oz) Most recent weight: Weight: 102.8 kg (226 lb 9.6 oz)     Palliative Assessment/Data: 80%     Time In: 2:10 Time Out: 3:20 Time Total: 70 min Greater than 50%  of this time was spent counseling and coordinating care related to the above assessment and plan.  Signed by: Asencion Gowda, NP   Please contact Palliative Medicine Team phone at [phone] for questions and concerns.  For individual provider: See Shea Evans

## 2018-01-09 NOTE — Progress Notes (Signed)
Patient ID: PRENTIS LANGDON, male   DOB: Jun 20, 1961, 57 y.o.   MRN: [id number] Patient ID: COYE [name], male   DOB: [birth year], 57 y.o.   MRN: [id number]     Advanced Heart Failure Rounding Note  PCP-Cardiologist: No primary care provider on file.   Subjective:    No complaints this morning.  Good urine output yesterday with addition of metolazone.  CVP 15 with co-ox 63%.  Still very edematous.   Echo:  Mildly dilated LV with EF 20%, moderate RV dysfunction, moderate TR, PASP 52 mmHg with dilated IVC.    Objective:   Weight Range: 226 lb 9.6 oz (102.8 kg) Body mass index is 32.51 kg/m.   Vital Signs:   Temp:  [97.7 F (36.5 C)-98.2 F (36.8 C)] 98 F (36.7 C) (06/06 0819) Pulse Rate:  [74-89] 87 (06/06 0819) Resp:  [18-20] 20 (06/06 0432) BP: (96-115)/(59-82) 107/80 (06/06 0819) SpO2:  [96 %-100 %] 99 % (06/06 0819) Weight:  [226 lb 9.6 oz (102.8 kg)] 226 lb 9.6 oz (102.8 kg) (06/06 0432) Last BM Date: 01/07/18  Weight change: Filed Weights   01/07/18 1814 01/08/18 0542 01/09/18 0432  Weight: 233 lb 15.7 oz (106.1 kg) 231 lb 9.6 oz (105.1 kg) 226 lb 9.6 oz (102.8 kg)    Intake/Output:   Intake/Output Summary (Last 24 hours) at 01/09/2018 0937 Last data filed at 01/09/2018 0531 Gross per 24 hour  Intake 430 ml  Output 4625 ml  Net -4195 ml      Physical Exam    General: NAD Neck: JVP 14+, no thyromegaly or thyroid nodule.  Lungs: Clear to auscultation bilaterally with normal respiratory effort. CV: Nondisplaced PMI.  Heart regular S1/S2, soft S3, 2/6 HSM LLSB.  2+ edema to thighs.   Abdomen: Soft, nontender, no hepatosplenomegaly, no distention.  Skin: Intact without lesions or rashes.  Neurologic: Alert and oriented x 3.  Psych: Normal affect. Extremities: No clubbing or cyanosis. Lower legs wrapped with ulcers HEENT: Normal.   Telemetry   NSR 80s (personally reviewed).    Labs    CBC Recent Labs    01/08/18 0533 01/09/18 0412  WBC 6.9 6.6    NEUTROABS 4.4 3.9  HGB 12.0* 11.7*  HCT 39.7 39.2  MCV 82.2 82.5  PLT 197 785   Basic Metabolic Panel Recent Labs    01/07/18 1408 01/07/18 1530 01/08/18 0533 01/09/18 0412  NA 139 140 139 140  K 4.7 4.2 4.1 3.8  CL 104 103 108 105  CO2 26 27 27 28   GLUCOSE 180* 158* 177* 115*  BUN 36* 36* 34* 35*  CREATININE 1.44* 1.45* 1.34* 1.27*  CALCIUM 9.0 9.1 8.4* 8.5*  MG 2.3 2.3  --   --    Liver Function Tests Recent Labs    01/07/18 1408 01/07/18 1530  AST 34 32  ALT 22 21  ALKPHOS 209* 210*  BILITOT 2.2* 2.1*  PROT 7.5 7.8  ALBUMIN 2.6* 2.6*   No results for input(s): LIPASE, AMYLASE in the last 72 hours. Cardiac Enzymes No results for input(s): CKTOTAL, CKMB, CKMBINDEX, TROPONINI in the last 72 hours.  BNP: BNP (last 3 results) Recent Labs    01/07/18 1530  BNP 1,784.5*    ProBNP (last 3 results) No results for input(s): PROBNP in the last 8760 hours.   D-Dimer No results for input(s): DDIMER in the last 72 hours. Hemoglobin A1C Recent Labs    01/07/18 1409  HGBA1C 9.1*   Fasting Lipid Panel Recent Labs  01/08/18 0533  CHOL 93  HDL 19*  LDLCALC 54  TRIG 98  CHOLHDL 4.9   Thyroid Function Tests Recent Labs    01/07/18 1530  TSH 1.766    Other results:   Imaging    Dg Orthopantogram  Result Date: 01/08/2018 CLINICAL DATA:  Tricuspid regurgitation EXAM: ORTHOPANTOGRAM/PANORAMIC COMPARISON:  None. FINDINGS: Panoramic view of the mandible was obtained and reveals dental caries involving multiple teeth in both the maxilla and mandible. Some periapical lucency is noted surrounding the third right mandibular molar. Questionable similar changes are noted surrounding the root of the left third mandibular molar. IMPRESSION: Dental caries with changes periapical lucency surrounding the third maxillary molars bilaterally this is more marked on the right than the left. Electronically Signed   By: Inez Catalina M.D.   On: 01/08/2018 21:22      Medications:     Scheduled Medications: . allopurinol  600 mg Oral Daily  . aspirin EC  81 mg Oral Daily  . carvedilol  6.25 mg Oral BID WC  . enoxaparin (LOVENOX) injection  40 mg Subcutaneous Q24H  . furosemide  80 mg Intravenous Q8H  . insulin aspart  0-20 Units Subcutaneous TID WC  . insulin aspart  0-5 Units Subcutaneous QHS  . insulin aspart  4 Units Subcutaneous TID WC  . potassium chloride SA  20 mEq Oral BID  . rosuvastatin  40 mg Oral Daily  . sacubitril-valsartan  1 tablet Oral BID  . silver sulfADIAZINE   Topical Daily  . sodium chloride flush  10-40 mL Intracatheter Q12H  . sodium chloride flush  3 mL Intravenous Q12H  . spironolactone  25 mg Oral Daily  . triamcinolone ointment   Topical BID    Infusions: . sodium chloride      PRN Medications: sodium chloride, acetaminophen, alum & mag hydroxide-simeth, ondansetron (ZOFRAN) IV, sodium chloride flush, sodium chloride flush, traMADol   Assessment/Plan   1. Acute on chronic systolic CHF: Ischemic cardiomyopathy.  Has Medtronic ICD.  Echo (6/19) with EF 20%, moderately dilated RV. CPX 5/19 with severe HF limitation and multiple high risk features.  NYHA class IIIb.  He remains markedly volume overloaded by exam with CVP 15 though co-ox looks reasonable at 63%. Good diuresis yesterday.  - Continue Lasix 80 mg IV every 8 hrs and add a dose of metolazone 2.5 x 1 again.   - With soft BP, will decrease Coreg and Entresto doses.  Continue current spironolactone.   - Will need RHC/LHC this admission, probably tomorrow if he remains stable.  Discussed risks/benefits with patient and he agrees to procedure.  - I am concerned that he may be nearing the need for advanced therapies. We are starting LVAD evaluation.  Will need to pay attention to RV and renal function. Dr. Prescott Gum has seen.  2. CKD: Stage 3.  Creatinine up to 1.5 recently, now down to 1.27.  Suspect cardiorenal, hopefully will continue to improve with  diuresis.  3. CAD: s/p CABG.  Last cath in 2013 with patent grafts.   - Continue ASA and statin.  - Repeat angiography tomorrow if he remains stable.   4. Bilateral lower extremity wounds: Suspect venous stasis versus drug reaction from ?meclizine.   - Wound consult has seen.  - ABIs.  - Given some asymmetric lower leg swelling will also order venous dopplers.  5. DM: hgbA1c around 9.  Diabetes coordinator following.  Glucose reasonably controlled currently.   Length of Stay: 2  Gio Janoski  Aundra Dubin, MD  01/09/2018, 9:37 AM  Advanced Heart Failure Team Pager [phone] (M-F; 7a - 4p)  Please contact [facility] for night-coverage after hours (4p -7a ) and weekends on [email or web address]

## 2018-01-10 ENCOUNTER — Encounter (HOSPITAL_COMMUNITY): Payer: Self-pay | Admitting: Cardiology

## 2018-01-10 ENCOUNTER — Inpatient Hospital Stay (HOSPITAL_COMMUNITY)
Admission: AD | Disposition: A | Discharge status: Home / Self Care | Payer: Self-pay | Source: Ambulatory Visit | Attending: Cardiology

## 2018-01-10 ENCOUNTER — Inpatient Hospital Stay (HOSPITAL_COMMUNITY): Payer: BLUE CROSS/BLUE SHIELD

## 2018-01-10 DIAGNOSIS — I509 Heart failure, unspecified: Secondary | ICD-10-CM

## 2018-01-10 DIAGNOSIS — I2510 Atherosclerotic heart disease of native coronary artery without angina pectoris: Secondary | ICD-10-CM

## 2018-01-10 DIAGNOSIS — Z01810 Encounter for preprocedural cardiovascular examination: Secondary | ICD-10-CM

## 2018-01-10 HISTORY — PX: RIGHT/LEFT HEART CATH AND CORONARY ANGIOGRAPHY: CATH118266

## 2018-01-10 LAB — BASIC METABOLIC PANEL
Anion gap: 7 (ref 5–15)
BUN: 38 mg/dL — ABNORMAL HIGH (ref 6–20)
CO2: 29 mmol/L (ref 22–32)
CREATININE: 1.44 mg/dL — AB (ref 0.61–1.24)
Calcium: 8.7 mg/dL — ABNORMAL LOW (ref 8.9–10.3)
Chloride: 103 mmol/L (ref 101–111)
GFR calc non Af Amer: 53 mL/min — ABNORMAL LOW (ref >60)
Glucose, Bld: 162 mg/dL — ABNORMAL HIGH (ref 65–99)
Potassium: 4.3 mmol/L (ref 3.5–5.1)
SODIUM: 139 mmol/L (ref 135–145)

## 2018-01-10 LAB — CBC WITH DIFFERENTIAL/PLATELET
Abs Immature Granulocytes: 0 10*3/uL (ref 0.0–0.1)
BASOS PCT: 0 %
Basophils Absolute: 0 10*3/uL (ref 0.0–0.1)
EOS ABS: 0.2 10*3/uL (ref 0.0–0.7)
Eosinophils Relative: 2 %
HCT: 40.9 % (ref 39.0–52.0)
Hemoglobin: 12.5 g/dL — ABNORMAL LOW (ref 13.0–17.0)
Immature Granulocytes: 0 %
Lymphocytes Relative: 28 %
Lymphs Abs: 2 10*3/uL (ref 0.7–4.0)
MCH: 25.2 pg — ABNORMAL LOW (ref 26.0–34.0)
MCHC: 30.6 g/dL (ref 30.0–36.0)
MCV: 82.3 fL (ref 78.0–100.0)
Monocytes Absolute: 0.8 10*3/uL (ref 0.1–1.0)
Monocytes Relative: 11 %
NEUTROS PCT: 59 %
Neutro Abs: 4.2 10*3/uL (ref 1.7–7.7)
PLATELETS: 214 10*3/uL (ref 150–400)
RBC: 4.97 MIL/uL (ref 4.22–5.81)
RDW: 16.5 % — AB (ref 11.5–15.5)
WBC: 7.2 10*3/uL (ref 4.0–10.5)

## 2018-01-10 LAB — POCT I-STAT 3, VENOUS BLOOD GAS (G3P V)
Acid-Base Excess: 2 mmol/L (ref 0.0–2.0)
Acid-Base Excess: 3 mmol/L — ABNORMAL HIGH (ref 0.0–2.0)
Bicarbonate: 28.1 mmol/L — ABNORMAL HIGH (ref 20.0–28.0)
Bicarbonate: 29.3 mmol/L — ABNORMAL HIGH (ref 20.0–28.0)
O2 Saturation: 57 %
O2 Saturation: 59 %
TCO2: 30 mmol/L (ref 22–32)
TCO2: 31 mmol/L (ref 22–32)
pCO2, Ven: 47.5 mmHg (ref 44.0–60.0)
pCO2, Ven: 48.4 mmHg (ref 44.0–60.0)
pH, Ven: 7.381 (ref 7.250–7.430)
pH, Ven: 7.39 (ref 7.250–7.430)
pO2, Ven: 30 mmHg — CL (ref 32.0–45.0)
pO2, Ven: 32 mmHg (ref 32.0–45.0)

## 2018-01-10 LAB — LUPUS ANTICOAGULANT PANEL
DRVVT: 39.5 s (ref 0.0–47.0)
PTT LA: 41.8 s (ref 0.0–51.9)

## 2018-01-10 LAB — HEPATITIS C ANTIBODY: HCV Ab: <0.1 s/co ratio (ref 0.0–0.9)

## 2018-01-10 LAB — GLUCOSE, CAPILLARY
GLUCOSE-CAPILLARY: 152 mg/dL — AB (ref 65–99)
Glucose-Capillary: 152 mg/dL — ABNORMAL HIGH (ref 65–99)
Glucose-Capillary: 163 mg/dL — ABNORMAL HIGH (ref 65–99)
Glucose-Capillary: 151 mg/dL — ABNORMAL HIGH (ref 65–99)
Glucose-Capillary: 139 mg/dL — ABNORMAL HIGH (ref 65–99)

## 2018-01-10 LAB — COOXEMETRY PANEL
Carboxyhemoglobin: 1.9 % — ABNORMAL HIGH (ref 0.5–1.5)
Methemoglobin: 0.8 % (ref 0.0–1.5)
O2 Saturation: 59.8 %
Total hemoglobin: 12.9 g/dL (ref 12.0–16.0)

## 2018-01-10 LAB — HEPATITIS B SURFACE ANTIBODY,QUALITATIVE: Hep B S Ab: NONREACTIVE

## 2018-01-10 LAB — HEPATITIS B SURFACE ANTIGEN: HEP B S AG: NEGATIVE

## 2018-01-10 LAB — HEPATITIS B CORE ANTIBODY, IGM: HEP B C IGM: NEGATIVE

## 2018-01-10 SURGERY — RIGHT/LEFT HEART CATH AND CORONARY ANGIOGRAPHY
Anesthesia: LOCAL

## 2018-01-10 MED ORDER — HEPARIN (PORCINE) IN NACL 2-0.9 UNITS/ML
INTRAMUSCULAR | Status: AC | PRN
  Administered 2018-01-10 (×2): 500 mL

## 2018-01-10 MED ORDER — IOHEXOL 350 MG/ML SOLN: INTRAVENOUS | Status: DC | PRN

## 2018-01-10 MED ORDER — FUROSEMIDE 10 MG/ML IJ SOLN
80.0000 mg | Freq: Three times a day (TID) | INTRAMUSCULAR | Status: DC
  Administered 2018-01-10 – 2018-01-11 (×3): 80 mg via INTRAVENOUS
  Filled 2018-01-10 (×3): qty 8

## 2018-01-10 MED ORDER — IOPAMIDOL (ISOVUE-370) INJECTION 76%
INTRAVENOUS | Status: DC | PRN
  Administered 2018-01-10: 50 mL

## 2018-01-10 MED ORDER — LIDOCAINE HCL (PF) 1 % IJ SOLN
INTRAMUSCULAR | Status: DC | PRN
  Administered 2018-01-10: 15 mL

## 2018-01-10 MED ORDER — FENTANYL CITRATE (PF) 100 MCG/2ML IJ SOLN
INTRAMUSCULAR | Status: DC | PRN
  Administered 2018-01-10: 25 ug via INTRAVENOUS

## 2018-01-10 MED ORDER — MIDAZOLAM HCL 2 MG/2ML IJ SOLN
INTRAMUSCULAR | Status: AC
  Filled 2018-01-10: qty 2

## 2018-01-10 MED ORDER — FENTANYL CITRATE (PF) 100 MCG/2ML IJ SOLN
INTRAMUSCULAR | Status: AC
  Filled 2018-01-10: qty 2

## 2018-01-10 MED ORDER — IOPAMIDOL (ISOVUE-370) INJECTION 76%
INTRAVENOUS | Status: AC
  Filled 2018-01-10: qty 100

## 2018-01-10 MED ORDER — MILRINONE LACTATE IN DEXTROSE 20-5 MG/100ML-% IV SOLN
0.2500 ug/kg/min | INTRAVENOUS | Status: DC
  Administered 2018-01-10 – 2018-01-12 (×4): 0.25 ug/kg/min via INTRAVENOUS
  Filled 2018-01-10 (×7): qty 100

## 2018-01-10 MED ORDER — HEPARIN (PORCINE) IN NACL 1000-0.9 UT/500ML-% IV SOLN
INTRAVENOUS | Status: AC
  Filled 2018-01-10: qty 1000

## 2018-01-10 MED ORDER — MIDAZOLAM HCL 2 MG/2ML IJ SOLN
INTRAMUSCULAR | Status: DC | PRN
  Administered 2018-01-10: 1 mg via INTRAVENOUS

## 2018-01-10 MED ORDER — LIDOCAINE HCL (PF) 1 % IJ SOLN
INTRAMUSCULAR | Status: AC
  Filled 2018-01-10: qty 30

## 2018-01-10 SURGICAL SUPPLY — 9 items
CATH INFINITI 5FR MULTPACK ANG (CATHETERS) ×1 IMPLANT
CATH SWAN GANZ 7F STRAIGHT (CATHETERS) ×1 IMPLANT
KIT HEART LEFT (KITS) ×2 IMPLANT
PACK CARDIAC CATHETERIZATION (CUSTOM PROCEDURE TRAY) ×2 IMPLANT
SHEATH AVANTI 11CM 5FR (SHEATH) ×1 IMPLANT
SHEATH PINNACLE 7F 10CM (SHEATH) ×1 IMPLANT
TRANSDUCER W/STOPCOCK (MISCELLANEOUS) ×2 IMPLANT
TUBING CIL FLEX 10 FLL-RA (TUBING) ×2 IMPLANT
WIRE EMERALD 3MM-J .035X150CM (WIRE) ×1 IMPLANT

## 2018-01-10 NOTE — Progress Notes (Signed)
Patient ID: ANIK WESCH, male   DOB: Nov 02, 1960, 57 y.o.   MRN: [id number]     Advanced Heart Failure Rounding Note  PCP-Cardiologist: No primary care provider on file.   Subjective:    He continued to diurese well yesterday, weight down another 7 lbs.  Feeling better.  Cath done today as below.  Creatinine up to 1.47.   Echo:  Mildly dilated LV with EF 20%, moderate RV dysfunction, moderate TR, PASP 52 mmHg with dilated IVC.   RHC/LHC (6/7):   Dominance: Right  Left Main  Totally occluded.  Left Anterior Descending  Patent LIMA to ?large diagonal. The touchdown vessel has diffuse disease up to 60-70% in several areas.  Left Circumflex  Patent SVG-OM. The OM back-fills to the AV LCx though there is a 95% ostial OM stenosis.  Right Coronary Artery  Diffuse disease in the RCA system. 70% proximal RCA stenosis. There is a high take-off of a PLV branch. This branch has mid 80% stenosis. Serial 80-90% stenoses in the PDA. SVG-PLV is patent.  Intervention   No interventions have been documented.  Right Heart   Right Heart Pressures RHC Procedural Findings: Hemodynamics (mmHg) RA mean 15 RV 57/17 PA 54/24, mean 37 PCWP mean 32 LV 103/23 AO 106/64  Oxygen saturations: PA 57% AO 99%  Cardiac Output (Fick) 4.04  Cardiac Index (Fick) 1.86  Cardiac Output (Thermo) 3.56 Cardiac Index (Thermo) 1.64  PAPi 2 PVR 1.25 WU CVP/PCWP 0.47    Objective:   Weight Range: 219 lb 1.6 oz (99.4 kg) Body mass index is 31.44 kg/m.   Vital Signs:   Temp:  [97.4 F (36.3 C)-98.6 F (37 C)] 98.3 F (36.8 C) (06/07 0402) Pulse Rate:  [57-104] 83 (06/07 0837) Resp:  [0-23] 18 (06/07 0827) BP: (95-128)/(60-84) 110/75 (06/07 0837) SpO2:  [0 %-100 %] 98 % (06/07 0827) Weight:  [219 lb 1.6 oz (99.4 kg)] 219 lb 1.6 oz (99.4 kg) (06/07 0402) Last BM Date: 01/07/18  Weight change: Filed Weights   01/08/18 0542 01/09/18 0432 01/10/18 0402  Weight: 231 lb 9.6 oz (105.1 kg) 226 lb 9.6 oz  (102.8 kg) 219 lb 1.6 oz (99.4 kg)    Intake/Output:   Intake/Output Summary (Last 24 hours) at 01/10/2018 0850 Last data filed at 01/10/2018 0830 Gross per 24 hour  Intake 10 ml  Output 5350 ml  Net -5340 ml      Physical Exam    General: NAD Neck: JVP 14 cm, no thyromegaly or thyroid nodule.  Lungs: Clear to auscultation bilaterally with normal respiratory effort. CV: Lateral PMI.  Heart regular S1/S2, no S3/S4, 2/6 HSM LLSB.  1+ edema to knees.   Abdomen: Soft, nontender, no hepatosplenomegaly, no distention.  Skin: Intact without lesions or rashes.  Neurologic: Alert and oriented x 3.  Psych: Normal affect. Extremities: No clubbing or cyanosis.  HEENT: Normal.    Telemetry   NSR 80s (personally reviewed).    Labs    CBC Recent Labs    01/09/18 0412 01/10/18 0413  WBC 6.6 7.2  NEUTROABS 3.9 4.2  HGB 11.7* 12.5*  HCT 39.2 40.9  MCV 82.5 82.3  PLT 176 732   Basic Metabolic Panel Recent Labs    01/07/18 1408 01/07/18 1530  01/09/18 0412 01/10/18 0413  NA 139 140   < > 140 139  K 4.7 4.2   < > 3.8 4.3  CL 104 103   < > 105 103  CO2 26 27   < >  28 29  GLUCOSE 180* 158*   < > 115* 162*  BUN 36* 36*   < > 35* 38*  CREATININE 1.44* 1.45*   < > 1.27* 1.44*  CALCIUM 9.0 9.1   < > 8.5* 8.7*  MG 2.3 2.3  --   --   --    < > = values in this interval not displayed.   Liver Function Tests Recent Labs    01/07/18 1408 01/07/18 1530  AST 34 32  ALT 22 21  ALKPHOS 209* 210*  BILITOT 2.2* 2.1*  PROT 7.5 7.8  ALBUMIN 2.6* 2.6*   No results for input(s): LIPASE, AMYLASE in the last 72 hours. Cardiac Enzymes No results for input(s): CKTOTAL, CKMB, CKMBINDEX, TROPONINI in the last 72 hours.  BNP: BNP (last 3 results) Recent Labs    01/07/18 1530  BNP 1,784.5*    ProBNP (last 3 results) No results for input(s): PROBNP in the last 8760 hours.   D-Dimer No results for input(s): DDIMER in the last 72 hours. Hemoglobin A1C Recent Labs     01/07/18 1409  HGBA1C 9.1*   Fasting Lipid Panel Recent Labs    01/08/18 0533  CHOL 93  HDL 19*  LDLCALC 54  TRIG 98  CHOLHDL 4.9   Thyroid Function Tests Recent Labs    01/07/18 1530  TSH 1.766    Other results:   Imaging    No results found.   Medications:     Scheduled Medications: . [MAR Hold] allopurinol  600 mg Oral Daily  . [MAR Hold] aspirin EC  81 mg Oral Daily  . [MAR Hold] carvedilol  6.25 mg Oral BID WC  . [MAR Hold] enoxaparin (LOVENOX) injection  40 mg Subcutaneous Q24H  . furosemide  80 mg Intravenous Q8H  . [MAR Hold] insulin aspart  0-20 Units Subcutaneous TID WC  . [MAR Hold] insulin aspart  0-5 Units Subcutaneous QHS  . [MAR Hold] insulin aspart  4 Units Subcutaneous TID WC  . [MAR Hold] potassium chloride SA  20 mEq Oral BID  . [MAR Hold] rosuvastatin  40 mg Oral Daily  . [MAR Hold] sacubitril-valsartan  1 tablet Oral BID  . [MAR Hold] silver sulfADIAZINE   Topical Daily  . [MAR Hold] sodium chloride flush  10-40 mL Intracatheter Q12H  . [MAR Hold] sodium chloride flush  3 mL Intravenous Q12H  . sodium chloride flush  3 mL Intravenous Q12H  . [MAR Hold] spironolactone  25 mg Oral Daily  . [MAR Hold] triamcinolone ointment   Topical BID    Infusions: . [MAR Hold] sodium chloride    . sodium chloride    . sodium chloride    . milrinone      PRN Medications: [MAR Hold] sodium chloride, sodium chloride, [MAR Hold] acetaminophen, [MAR Hold] alum & mag hydroxide-simeth, [MAR Hold] ondansetron (ZOFRAN) IV, [MAR Hold] sodium chloride flush, [MAR Hold] sodium chloride flush, sodium chloride flush, [MAR Hold] traMADol   Assessment/Plan   1. Acute on chronic systolic CHF: Ischemic cardiomyopathy.  Has Med[employer]onic ICD.  Echo (6/19) with EF 20%, moderately dilated RV. CPX 5/19 with severe HF limitation and multiple high risk features.  NYHA class IIIb.  RHC today showed ongoing elevated filling pressures with low cardiac output.  On exam he  remains volume overloaded and needs further diuresis.  PAPi 2 on RHC, has RV dysfunction but not marked. - With low output, will add milrinone 0.25 as he is diuresed.  - Continue Lasix 80  mg IV every 8 hrs today, will hold off on metolazone today given cath contrast but likely resume tomorrow if CVP remains high.  - With soft BP, have decreased Coreg and Entresto doses, continue current doses for now.  Continue current spironolactone.   - I am concerned that he is going to need advanced therapies soon. We are starting LVAD evaluation.  Will need to pay attention to RV and renal function => I do not think RV is prohibitive based on cath today. Dr. Prescott Gum has seen. He would be a transplant candidate as well I think, but given low output and recent marked worsening at home, not sure we will have the time to wait.  Need to get blood type.   2. CKD: Stage 3. Creatinine 1.47 today.  Suspect cardiorenal, may improve with milrinone (starting today).  3. CAD: s/p CABG.  Angiography today with patent grafts but very severe native CAD.  No interventional target.    - Continue ASA and statin.  4. Bilateral lower extremity wounds: Suspect venous stasis versus drug reaction from ?meclizine.   - Wound consult has seen.  - Unable to obtain ABIs with the wounds.  - Given some asymmetric lower leg swelling will also order venous dopplers => pending.   5. DM: hgbA1c around 9.  Diabetes coordinator following.  Glucose reasonably controlled currently.   Length of Stay: 3  Loralie Champagne, MD  01/10/2018, 8:50 AM  Advanced Heart Failure Team Pager [phone] (M-F; 7a - 4p)  Please contact [facility] for night-coverage after hours (4p -7a ) and weekends on [email or web address]

## 2018-01-10 NOTE — Progress Notes (Signed)
Site area: Rt fem art and venous sheath removed. Site Prior to Removal:  Level 0 Pressure Applied For:14min Manual:   yes Patient Status During Pull:  A/O Post Pull Site:  Level 0 Post Pull Instructions Given:  yes Post Pull Pulses Present: 2+ Rt pt Dressing Applied:  tegaderm and a wxw Bedrest begins @ 900am Comments: Pt leaves cath lab holding area in stable condition.  Rt groin is unremarkable. Rt groin dressing is CDI. Post instructions given to pt and pt understands.

## 2018-01-10 NOTE — Progress Notes (Signed)
Carotid duplex prelim:1-39%  ICA stenosis. ABI done 01/09/18. LE venous duplex prelim: negative for DVT in visualized veins.  Landry Mellow, RDMS, RVT

## 2018-01-10 NOTE — Plan of Care (Signed)
In to see patient. Patient currently in vein mapping. Will follow up Sunday.

## 2018-01-10 NOTE — Progress Notes (Signed)
Patient had 21 beats of Vtach. Checked on patient, asymptomatic. Dr. Aundra Dubin notified.

## 2018-01-10 NOTE — Interval H&P Note (Signed)
History and Physical Interval Note:  01/10/2018 7:45 AM  Marcus Johnson  has presented today for surgery, with the diagnosis of chf  The various methods of treatment have been discussed with the patient and family. After consideration of risks, benefits and other options for treatment, the patient has consented to  Procedure(s): RIGHT/LEFT HEART CATH AND CORONARY ANGIOGRAPHY (N/A) as a surgical intervention .  The patient's history has been reviewed, patient examined, no change in status, stable for surgery.  I have reviewed the patient's chart and labs.  Questions were answered to the patient's satisfaction.     Judyth Demarais [employer]

## 2018-01-11 ENCOUNTER — Inpatient Hospital Stay (HOSPITAL_COMMUNITY): Payer: BLUE CROSS/BLUE SHIELD

## 2018-01-11 LAB — BASIC METABOLIC PANEL
Anion gap: 9 (ref 5–15)
BUN: 34 mg/dL — AB (ref 6–20)
CALCIUM: 9 mg/dL (ref 8.9–10.3)
CO2: 31 mmol/L (ref 22–32)
CREATININE: 1.29 mg/dL — AB (ref 0.61–1.24)
Chloride: 96 mmol/L — ABNORMAL LOW (ref 101–111)
GFR calc Af Amer: >60 mL/min (ref >60)
GFR calc non Af Amer: 60 mL/min — ABNORMAL LOW (ref >60)
GLUCOSE: 186 mg/dL — AB (ref 65–99)
Potassium: 3.9 mmol/L (ref 3.5–5.1)
Sodium: 136 mmol/L (ref 135–145)

## 2018-01-11 LAB — CBC WITH DIFFERENTIAL/PLATELET
Abs Immature Granulocytes: 0 10*3/uL (ref 0.0–0.1)
Basophils Absolute: 0 10*3/uL (ref 0.0–0.1)
Basophils Relative: 0 %
EOS ABS: 0.1 10*3/uL (ref 0.0–0.7)
EOS PCT: 2 %
HEMATOCRIT: 40.6 % (ref 39.0–52.0)
Hemoglobin: 12.6 g/dL — ABNORMAL LOW (ref 13.0–17.0)
Immature Granulocytes: 0 %
LYMPHS ABS: 2.2 10*3/uL (ref 0.7–4.0)
Lymphocytes Relative: 29 %
MCH: 24.8 pg — ABNORMAL LOW (ref 26.0–34.0)
MCHC: 31 g/dL (ref 30.0–36.0)
MCV: 79.9 fL (ref 78.0–100.0)
MONOS PCT: 11 %
Monocytes Absolute: 0.9 10*3/uL (ref 0.1–1.0)
Neutro Abs: 4.3 10*3/uL (ref 1.7–7.7)
Neutrophils Relative %: 58 %
Platelets: 213 10*3/uL (ref 150–400)
RBC: 5.08 MIL/uL (ref 4.22–5.81)
RDW: 16.3 % — AB (ref 11.5–15.5)
WBC: 7.6 10*3/uL (ref 4.0–10.5)

## 2018-01-11 LAB — COOXEMETRY PANEL
Carboxyhemoglobin: 1.8 % — ABNORMAL HIGH (ref 0.5–1.5)
Methemoglobin: 0.7 % (ref 0.0–1.5)
O2 SAT: 46.8 %
TOTAL HEMOGLOBIN: 14.4 g/dL (ref 12.0–16.0)

## 2018-01-11 LAB — GLUCOSE, CAPILLARY
GLUCOSE-CAPILLARY: 149 mg/dL — AB (ref 65–99)
GLUCOSE-CAPILLARY: 203 mg/dL — AB (ref 65–99)
Glucose-Capillary: 207 mg/dL — ABNORMAL HIGH (ref 65–99)
Glucose-Capillary: 224 mg/dL — ABNORMAL HIGH (ref 65–99)

## 2018-01-11 MED ORDER — FUROSEMIDE 10 MG/ML IJ SOLN
80.0000 mg | Freq: Two times a day (BID) | INTRAMUSCULAR | Status: DC
  Administered 2018-01-11 – 2018-01-12 (×2): 80 mg via INTRAVENOUS
  Filled 2018-01-11 (×2): qty 8

## 2018-01-11 MED ORDER — ENOXAPARIN SODIUM 40 MG/0.4ML ~~LOC~~ SOLN
40.0000 mg | SUBCUTANEOUS | Status: DC
  Administered 2018-01-12 – 2018-01-13 (×3): 40 mg via SUBCUTANEOUS
  Filled 2018-01-11 (×3): qty 0.4

## 2018-01-11 NOTE — Progress Notes (Signed)
Physical Therapy Treatment Patient Details Name: Marcus Johnson MRN: [id number] DOB: 27-Feb-1961 Today's Date: 01/11/2018    History of Present Illness Pt is a 57 y/o male admitted secondary to CHF exacerbation. Chest imaging revealed enlargement of cardiac silhouette with pulmonary vascular congestion. PMH includes HTN, ischemic cardiomyopathy s/p ICD, CKD, CAD s/p CABG, and bilat LE wounds.     PT Comments    Patient progressing with therapy this visit, increasing ambulation distance to 600'. HRmax 108, SpO2 >93% on RA, BP 127/100 (128) after session. Unable to focus on stair training due to IV Milrinone, MD reports we can focus on stairs once it is d/ced, will focus on next PT visit. Pt ambulating well without AD, no c/o of SOB or chest pain during visit. Patient is a pleasure to work with and eager to increase activity tolerance.     Follow Up Recommendations  Supervision for mobility/OOB;Home health PT     Equipment Recommendations  3in1 (PT)    Recommendations for Other Services       Precautions / Restrictions Precautions Precautions: None Restrictions Weight Bearing Restrictions: No    Mobility  Bed Mobility               General bed mobility comments: Sitting EOB upon entry   Transfers Overall transfer level: Needs assistance Equipment used: None Transfers: Sit to/from Stand Sit to Stand: Supervision         General transfer comment: Supervision for safety.   Ambulation/Gait Ambulation/Gait assistance: Supervision Ambulation Distance (Feet): 600 Feet Assistive device: None Gait Pattern/deviations: Step-through pattern;Decreased stride length Gait velocity: Decreased    General Gait Details: patient ambulating unit without AD or LOB. HRmax 108, SpO2 94% RA   Stairs         General stair comments: unable to focus on stair training today due to Milrinone IV today. will focus on next visit   Wheelchair Mobility    Modified Rankin (Stroke  Patients Only)       Balance Overall balance assessment: Needs assistance Sitting-balance support: No upper extremity supported;Feet supported Sitting balance-Leahy Scale: Good     Standing balance support: Single extremity supported;No upper extremity supported;During functional activity Standing balance-Leahy Scale: Fair Standing balance comment: Able to maintain static standing without UE support.                             Cognition Arousal/Alertness: Awake/alert Behavior During Therapy: WFL for tasks assessed/performed Overall Cognitive Status: Within Functional Limits for tasks assessed                                        Exercises      General Comments        Pertinent Vitals/Pain Pain Assessment: No/denies pain    Home Living                      Prior Function            PT Goals (current goals can now be found in the care plan section) Acute Rehab PT Goals Patient Stated Goal: to go home  PT Goal Formulation: With patient Time For Goal Achievement: 01/22/18 Potential to Achieve Goals: Good Progress towards PT goals: Progressing toward goals    Frequency    Min 3X/week      PT Plan Current  plan remains appropriate    Co-evaluation              AM-PAC PT "6 Clicks" Daily Activity  Outcome Measure  Difficulty turning over in bed (including adjusting bedclothes, sheets and blankets)?: None Difficulty moving from lying on back to sitting on the side of the bed? : None Difficulty sitting down on and standing up from a chair with arms (e.g., wheelchair, bedside commode, etc,.)?: A Little Help needed moving to and from a bed to chair (including a wheelchair)?: A Little Help needed walking in hospital room?: A Little Help needed climbing 3-5 steps with a railing? : A Lot 6 Click Score: 19    End of Session Equipment Utilized During Treatment: Gait belt Activity Tolerance: Patient tolerated treatment  well Patient left: in bed;with call bell/phone within reach Nurse Communication: Mobility status PT Visit Diagnosis: Other abnormalities of gait and mobility (R26.89)     Time: 5597-4163 PT Time Calculation (min) (ACUTE ONLY): 25 min  Charges:  $Gait Training: 8-22 mins                    G Codes:       Reinaldo Berber, PT, DPT Acute Rehab Services Pager: [phone]     Reinaldo Berber 01/11/2018, 9:51 AM

## 2018-01-11 NOTE — Progress Notes (Signed)
Patient ID: Marcus Johnson, male   DOB: 1960-09-11, 57 y.o.   MRN: [id number]     Advanced Heart Failure Rounding Note  PCP-Cardiologist: No primary care provider on file.   Subjective:    Milrinone added yesterday. He continued to diurese well yesterday, weight down another 10 lbs. CVP 6 No co-ox drawn.   Breathing better. No orthopnea or PND. But says he still feels like he has some fluid in his legs. Creatinine improving.   Echo:  Mildly dilated LV with EF 20%, moderate RV dysfunction, moderate TR, PASP 52 mmHg with dilated IVC.   RHC/LHC (6/7):   Dominance: Right  Left Main  Totally occluded.  Left Anterior Descending  Patent LIMA to ?large diagonal. The touchdown vessel has diffuse disease up to 60-70% in several areas.  Left Circumflex  Patent SVG-OM. The OM back-fills to the AV LCx though there is a 95% ostial OM stenosis.  Right Coronary Artery  Diffuse disease in the RCA system. 70% proximal RCA stenosis. There is a high take-off of a PLV branch. This branch has mid 80% stenosis. Serial 80-90% stenoses in the PDA. SVG-PLV is patent.  Intervention   No interventions have been documented.  Right Heart   Right Heart Pressures RHC Procedural Findings: Hemodynamics (mmHg) RA mean 15 RV 57/17 PA 54/24, mean 37 PCWP mean 32 LV 103/23 AO 106/64  Oxygen saturations: PA 57% AO 99%  Cardiac Output (Fick) 4.04  Cardiac Index (Fick) 1.86  Cardiac Output (Thermo) 3.56 Cardiac Index (Thermo) 1.64  PAPi 2 PVR 1.25 WU CVP/PCWP 0.47    Objective:   Weight Range: 95 kg (209 lb 8 oz) Body mass index is 30.06 kg/m.   Vital Signs:   Temp:  [97.7 F (36.5 C)-99 F (37.2 C)] 99 F (37.2 C) (06/08 0514) Pulse Rate:  [80-96] 95 (06/08 0514) Resp:  [17-19] 18 (06/08 0514) BP: (93-122)/(62-82) 108/69 (06/08 0514) SpO2:  [94 %-100 %] 98 % (06/08 0514) Weight:  [95 kg (209 lb 8 oz)] 95 kg (209 lb 8 oz) (06/08 0514) Last BM Date: 01/08/18  Weight change: Filed Weights    01/09/18 0432 01/10/18 0402 01/11/18 0514  Weight: 102.8 kg (226 lb 9.6 oz) 99.4 kg (219 lb 1.6 oz) 95 kg (209 lb 8 oz)    Intake/Output:   Intake/Output Summary (Last 24 hours) at 01/11/2018 0903 Last data filed at 01/11/2018 0519 Gross per 24 hour  Intake 1307.63 ml  Output 4650 ml  Net -3342.37 ml      Physical Exam    General:  Well appearing. Lying flat in bed No resp difficulty HEENT: normal Neck: supple. JVP 6. Carotids 2+ bilat; no bruits. No lymphadenopathy or thryomegaly appreciated. Cor: PMI laterally displaced. Tachy regular +s3 Lungs: clear Abdomen: soft, nontender, nondistended. No hepatosplenomegaly. No bruits or masses. Good bowel sounds. Extremities: no cyanosis, clubbing, rash, 1+  Edema in thighs  LEs wrapped Neuro: alert & orientedx3, cranial nerves grossly intact. moves all 4 extremities w/o difficulty. Affect pleasant   Telemetry   NSR 90-105  (personally reviewed).    Labs    CBC Recent Labs    01/10/18 0413 01/11/18 0508  WBC 7.2 7.6  NEUTROABS 4.2 4.3  HGB 12.5* 12.6*  HCT 40.9 40.6  MCV 82.3 79.9  PLT 214 092   Basic Metabolic Panel Recent Labs    01/10/18 0413 01/11/18 0508  NA 139 136  K 4.3 3.9  CL 103 96*  CO2 29 31  GLUCOSE 162*  186*  BUN 38* 34*  CREATININE 1.44* 1.29*  CALCIUM 8.7* 9.0   Liver Function Tests No results for input(s): AST, ALT, ALKPHOS, BILITOT, PROT, ALBUMIN in the last 72 hours. No results for input(s): LIPASE, AMYLASE in the last 72 hours. Cardiac Enzymes No results for input(s): CKTOTAL, CKMB, CKMBINDEX, TROPONINI in the last 72 hours.  BNP: BNP (last 3 results) Recent Labs    01/07/18 1530  BNP 1,784.5*    ProBNP (last 3 results) No results for input(s): PROBNP in the last 8760 hours.   D-Dimer No results for input(s): DDIMER in the last 72 hours. Hemoglobin A1C No results for input(s): HGBA1C in the last 72 hours. Fasting Lipid Panel No results for input(s): CHOL, HDL, LDLCALC,  TRIG, CHOLHDL, LDLDIRECT in the last 72 hours. Thyroid Function Tests No results for input(s): TSH, T4TOTAL, T3FREE, THYROIDAB in the last 72 hours.  Invalid input(s): FREET3  Other results:   Imaging    No results found.   Medications:     Scheduled Medications: . allopurinol  600 mg Oral Daily  . aspirin EC  81 mg Oral Daily  . carvedilol  6.25 mg Oral BID WC  . enoxaparin (LOVENOX) injection  40 mg Subcutaneous Q24H  . furosemide  80 mg Intravenous Q8H  . insulin aspart  0-20 Units Subcutaneous TID WC  . insulin aspart  0-5 Units Subcutaneous QHS  . insulin aspart  4 Units Subcutaneous TID WC  . potassium chloride SA  20 mEq Oral BID  . rosuvastatin  40 mg Oral Daily  . sacubitril-valsartan  1 tablet Oral BID  . silver sulfADIAZINE   Topical Daily  . sodium chloride flush  10-40 mL Intracatheter Q12H  . sodium chloride flush  3 mL Intravenous Q12H  . spironolactone  25 mg Oral Daily  . triamcinolone ointment   Topical BID    Infusions: . sodium chloride    . milrinone 0.25 mcg/kg/min (01/11/18 0300)    PRN Medications: sodium chloride, acetaminophen, alum & mag hydroxide-simeth, ondansetron (ZOFRAN) IV, sodium chloride flush, sodium chloride flush, traMADol   Assessment/Plan   1. Acute on chronic systolic CHF: Ischemic cardiomyopathy.  Has Medtronic ICD.  Echo (6/19) with EF 20%, moderately dilated RV. CPX 5/19 with severe HF limitation and multiple high risk features.  NYHA class IIIb.  RHC 6/7 showed ongoing elevated filling pressures with low cardiac output. PAPi 2 on RHC, has RV dysfunction but not marked. - Doing well on milrinone. Has had profound diuresis.  - CVP down to 6 and he is at his baseline weight (209 pounds) but still with some LE edema and urine very clear. Will continue IV lasix today but cut back to 80 bid, switch to po in am - With soft BP, have decreased Coreg and Entresto doses, continue current doses for now.  Continue current  spironolactone.   -W/u for advanced therapies underway. Will need to pay attention to RV and renal function => I do not think RV is prohibitive based on cath today. Dr. Prescott Gum has seen. He would be a transplant candidate as well I think, but given low output and recent marked worsening at home, not sure we will have the time to wait.   - Blood type A neg  2. CKD: Stage 3.  - Creatinine 1.29 today. (improved with milrinone) 3. CAD: s/p CABG.  Angiography 6/7  with patent grafts but very severe native CAD.  No interventional target.    - No s/s ischemia -  Continue ASA and statin.  4. Bilateral lower extremity wounds: Suspect venous stasis versus drug reaction from ?meclizine.   - Wound consult has seen.  - Unable to obtain ABIs with the wounds.  - Given some asymmetric lower leg swelling will also order venous dopplers => negative   5. DM: hgbA1c around 9.  Diabetes coordinator following.  Glucose reasonably controlled currently.  - Consider Jardiance in setting of CAD and HF  Length of Stay: [place], MD  01/11/2018, 9:03 AM  Advanced Heart Failure Team Pager [phone] (M-F; 7a - 4p)  Please contact [facility] for night-coverage after hours (4p -7a ) and weekends on [email or web address]

## 2018-01-11 NOTE — Progress Notes (Signed)
1 Day Post-Op Procedure(s) (LRB): RIGHT/LEFT HEART CATH AND CORONARY ANGIOGRAPHY (N/A) Subjective:  Images of cardiac cath personally reviewed showing patent bypass grafts to diagonal [Lima], circumflex marginal, and PDA PL. [Vein]  The LAD was not grafted because of his large preoperative MI with a scarred apex and atretic LAD  Patient feels much improved with diuresis I had frank discussion with patient regarding mechanical support with implantable VAD.  It is his feeling at this time that he has lived a full life and will keep his current cardiac status unchanged and is thankful that he is feeling better and can return home.  He understands that with admission for heart failure that his 1 year survival is less than 20% and that an implantable VAD would extend his survival and quality of life.  He remains steadfast in his decision on declining advanced therapy.  Objective: Vital signs in last 24 hours: Temp:  [97.7 F (36.5 C)-99 F (37.2 C)] 98.6 F (37 C) (06/08 1155) Pulse Rate:  [90-100] 91 (06/08 1155) Cardiac Rhythm: Normal sinus rhythm;Heart block (06/08 0845) Resp:  [17-19] 18 (06/08 0514) BP: (93-122)/(62-82) 118/68 (06/08 1155) SpO2:  [94 %-98 %] 96 % (06/08 1155) Weight:  [209 lb 8 oz (95 kg)] 209 lb 8 oz (95 kg) (06/08 0514)  Hemodynamic parameters for last 24 hours: CVP:  [6 mmHg-13 mmHg] 6 mmHg  Intake/Output from previous day: 06/07 0701 - 06/08 0700 In: 1307.6 [P.O.:1182; I.V.:125.6] Out: 4950 [Urine:4950] Intake/Output this shift: Total I/O In: 240 [P.O.:240] Out: 1200 [Urine:1200]   Patient weight down to 206 pounds Lungs clear Both legs wrapped and edematous  Lab Results: Recent Labs    01/10/18 0413 01/11/18 0508  WBC 7.2 7.6  HGB 12.5* 12.6*  HCT 40.9 40.6  PLT 214 213   BMET:  Recent Labs    01/10/18 0413 01/11/18 0508  NA 139 136  K 4.3 3.9  CL 103 96*  CO2 29 31  GLUCOSE 162* 186*  BUN 38* 34*  CREATININE 1.44* 1.29*  CALCIUM 8.7*  9.0    PT/INR:  Recent Labs    01/09/18 1526  LABPROT 16.1*  INR 1.30   ABG    Component Value Date/Time   HCO3 29.3 (H) 01/10/2018 0808   TCO2 31 01/10/2018 0808   ACIDBASEDEF 3.0 (H) 01/18/2015 0823   O2SAT 46.8 01/11/2018 0930   CBG (last 3)  Recent Labs    01/10/18 2105 01/11/18 0807 01/11/18 1152  GLUCAP 139* 149* 207*    Assessment/Plan: S/P Procedure(s) (LRB): RIGHT/LEFT HEART CATH AND CORONARY ANGIOGRAPHY (N/A) Continue medical therapy of advanced heart failure Patient indicates he is not interested in advance mechanical support therapies.   LOS: 4 days    Tharon Aquas Trigt III 01/11/2018

## 2018-01-11 NOTE — Progress Notes (Signed)
I notified Self from Cards about patient taking PO contrast.  I informed him that he is receiving 1,182CC from contrast. He said to monitor patient & let him know if patient may need more lasix. Will continue to monitor.

## 2018-01-12 LAB — BASIC METABOLIC PANEL
ANION GAP: 8 (ref 5–15)
BUN: 33 mg/dL — ABNORMAL HIGH (ref 6–20)
CALCIUM: 8.5 mg/dL — AB (ref 8.9–10.3)
CO2: 30 mmol/L (ref 22–32)
Chloride: 98 mmol/L — ABNORMAL LOW (ref 101–111)
Creatinine, Ser: 1.42 mg/dL — ABNORMAL HIGH (ref 0.61–1.24)
GFR calc Af Amer: >60 mL/min (ref >60)
GFR, EST NON AFRICAN AMERICAN: 53 mL/min — AB (ref >60)
Glucose, Bld: 190 mg/dL — ABNORMAL HIGH (ref 65–99)
Potassium: 4.4 mmol/L (ref 3.5–5.1)
Sodium: 136 mmol/L (ref 135–145)

## 2018-01-12 LAB — CBC WITH DIFFERENTIAL/PLATELET
Abs Immature Granulocytes: 0 10*3/uL (ref 0.0–0.1)
BASOS ABS: 0 10*3/uL (ref 0.0–0.1)
Basophils Relative: 0 %
EOS ABS: 0.2 10*3/uL (ref 0.0–0.7)
Eosinophils Relative: 2 %
HEMATOCRIT: 42.7 % (ref 39.0–52.0)
Hemoglobin: 13.2 g/dL (ref 13.0–17.0)
IMMATURE GRANULOCYTES: 0 %
LYMPHS ABS: 2.2 10*3/uL (ref 0.7–4.0)
LYMPHS PCT: 27 %
MCH: 24.8 pg — ABNORMAL LOW (ref 26.0–34.0)
MCHC: 30.9 g/dL (ref 30.0–36.0)
MCV: 80.1 fL (ref 78.0–100.0)
Monocytes Absolute: 0.9 10*3/uL (ref 0.1–1.0)
Monocytes Relative: 11 %
NEUTROS PCT: 60 %
Neutro Abs: 4.8 10*3/uL (ref 1.7–7.7)
Platelets: 232 10*3/uL (ref 150–400)
RBC: 5.33 MIL/uL (ref 4.22–5.81)
RDW: 16.3 % — AB (ref 11.5–15.5)
WBC: 8 10*3/uL (ref 4.0–10.5)

## 2018-01-12 LAB — COOXEMETRY PANEL
Carboxyhemoglobin: 1.7 % — ABNORMAL HIGH (ref 0.5–1.5)
Methemoglobin: 1.2 % (ref 0.0–1.5)
O2 SAT: 67.6 %
Total hemoglobin: 13.5 g/dL (ref 12.0–16.0)

## 2018-01-12 LAB — GLUCOSE, CAPILLARY
GLUCOSE-CAPILLARY: 439 mg/dL — AB (ref 65–99)
Glucose-Capillary: 176 mg/dL — ABNORMAL HIGH (ref 65–99)
Glucose-Capillary: 114 mg/dL — ABNORMAL HIGH (ref 65–99)
Glucose-Capillary: 211 mg/dL — ABNORMAL HIGH (ref 65–99)
Glucose-Capillary: 217 mg/dL — ABNORMAL HIGH (ref 65–99)
Glucose-Capillary: 224 mg/dL — ABNORMAL HIGH (ref 65–99)

## 2018-01-12 MED ORDER — CARVEDILOL 3.125 MG PO TABS
3.1250 mg | ORAL_TABLET | Freq: Two times a day (BID) | ORAL | Status: DC
  Administered 2018-01-12 – 2018-01-14 (×4): 3.125 mg via ORAL
  Filled 2018-01-12 (×4): qty 1

## 2018-01-12 MED ORDER — FUROSEMIDE 80 MG PO TABS
80.0000 mg | ORAL_TABLET | Freq: Two times a day (BID) | ORAL | Status: DC
  Administered 2018-01-12 – 2018-01-13 (×3): 80 mg via ORAL
  Filled 2018-01-12 (×3): qty 1

## 2018-01-12 MED ORDER — MILRINONE LACTATE IN DEXTROSE 20-5 MG/100ML-% IV SOLN
0.1250 ug/kg/min | INTRAVENOUS | Status: DC
  Administered 2018-01-13: 0.25 ug/kg/min via INTRAVENOUS
  Filled 2018-01-12: qty 100

## 2018-01-12 NOTE — Progress Notes (Addendum)
Patient ID: Marcus Johnson, male   DOB: Nov 11, 1960, 57 y.o.   MRN: [id number]     Advanced Heart Failure Rounding Note  PCP-Cardiologist: No primary care provider on file.   Subjective:    Supposed to be on milrinone 0.25 (but pump programmed at 0.75)) and IV lasix. Diuresing well. Down another 2 pounds (26 pounds total)   CVP 8-9 Co-ox 68%  Feels good. Denies SOB, orthopnea or PND. Wants to go home. Creatinine 1.29->1.42  He talked to Dr. Prescott Gum yesterday and said he was not interested in VAD or transplant at this time.    Echo:  Mildly dilated LV with EF 20%, moderate RV dysfunction, moderate TR, PASP 52 mmHg with dilated IVC.   RHC/LHC (6/7):   Dominance: Right  Left Main  Totally occluded.  Left Anterior Descending  Patent LIMA to ?large diagonal. The touchdown vessel has diffuse disease up to 60-70% in several areas.  Left Circumflex  Patent SVG-OM. The OM back-fills to the AV LCx though there is a 95% ostial OM stenosis.  Right Coronary Artery  Diffuse disease in the RCA system. 70% proximal RCA stenosis. There is a high take-off of a PLV branch. This branch has mid 80% stenosis. Serial 80-90% stenoses in the PDA. SVG-PLV is patent.  Intervention   No interventions have been documented.  Right Heart   Right Heart Pressures RHC Procedural Findings: Hemodynamics (mmHg) RA mean 15 RV 57/17 PA 54/24, mean 37 PCWP mean 32 LV 103/23 AO 106/64  Oxygen saturations: PA 57% AO 99%  Cardiac Output (Fick) 4.04  Cardiac Index (Fick) 1.86  Cardiac Output (Thermo) 3.56 Cardiac Index (Thermo) 1.64  PAPi 2 PVR 1.25 WU CVP/PCWP 0.47    Objective:   Weight Range: 93.9 kg (207 lb 1.6 oz) Body mass index is 29.72 kg/m.   Vital Signs:   Temp:  [98 F (36.7 C)-99.1 F (37.3 C)] 98.4 F (36.9 C) (06/09 0744) Pulse Rate:  [94-101] 101 (06/09 0744) Resp:  [14-18] 18 (06/09 0744) BP: (94-116)/(57-66) 108/63 (06/09 0744) SpO2:  [91 %-98 %] 96 % (06/09 0744) Weight:   [93.9 kg (207 lb 1.6 oz)] 93.9 kg (207 lb 1.6 oz) (06/09 0400) Last BM Date: 01/07/18  Weight change: Filed Weights   01/10/18 0402 01/11/18 0514 01/12/18 0400  Weight: 99.4 kg (219 lb 1.6 oz) 95 kg (209 lb 8 oz) 93.9 kg (207 lb 1.6 oz)    Intake/Output:   Intake/Output Summary (Last 24 hours) at 01/12/2018 1303 Last data filed at 01/12/2018 1144 Gross per 24 hour  Intake -  Output 2700 ml  Net -2700 ml      Physical Exam    General:  Lying in bed No resp difficulty HEENT: normal Neck: supple. JVP 8-9 Carotids 2+ bilat; no bruits. No lymphadenopathy or thryomegaly appreciated. Cor: PMI laterally displaced. Regular rate & rhythm. +s3 Lungs: clear Abdomen: soft, nontender, nondistended. No hepatosplenomegaly. No bruits or masses. Good bowel sounds. Extremities: no cyanosis, clubbing, rash, edema wrapped due to sores Neuro: alert & orientedx3, cranial nerves grossly intact. moves all 4 extremities w/o difficulty. Affect pleasant   Telemetry   NSR 90-105  (personally reviewed).    Labs    CBC Recent Labs    01/11/18 0508 01/12/18 0359  WBC 7.6 8.0  NEUTROABS 4.3 4.8  HGB 12.6* 13.2  HCT 40.6 42.7  MCV 79.9 80.1  PLT 213 443   Basic Metabolic Panel Recent Labs    01/11/18 0508 01/12/18 0359  NA 136 136  K 3.9 4.4  CL 96* 98*  CO2 31 30  GLUCOSE 186* 190*  BUN 34* 33*  CREATININE 1.29* 1.42*  CALCIUM 9.0 8.5*   Liver Function Tests No results for input(s): AST, ALT, ALKPHOS, BILITOT, PROT, ALBUMIN in the last 72 hours. No results for input(s): LIPASE, AMYLASE in the last 72 hours. Cardiac Enzymes No results for input(s): CKTOTAL, CKMB, CKMBINDEX, TROPONINI in the last 72 hours.  BNP: BNP (last 3 results) Recent Labs    01/07/18 1530  BNP 1,784.5*    ProBNP (last 3 results) No results for input(s): PROBNP in the last 8760 hours.   D-Dimer No results for input(s): DDIMER in the last 72 hours. Hemoglobin A1C No results for input(s): HGBA1C in  the last 72 hours. Fasting Lipid Panel No results for input(s): CHOL, HDL, LDLCALC, TRIG, CHOLHDL, LDLDIRECT in the last 72 hours. Thyroid Function Tests No results for input(s): TSH, T4TOTAL, T3FREE, THYROIDAB in the last 72 hours.  Invalid input(s): FREET3  Other results:   Imaging    No results found.   Medications:     Scheduled Medications: . allopurinol  600 mg Oral Daily  . aspirin EC  81 mg Oral Daily  . carvedilol  6.25 mg Oral BID WC  . enoxaparin (LOVENOX) injection  40 mg Subcutaneous Q24H  . furosemide  80 mg Intravenous BID  . insulin aspart  0-20 Units Subcutaneous TID WC  . insulin aspart  0-5 Units Subcutaneous QHS  . insulin aspart  4 Units Subcutaneous TID WC  . potassium chloride SA  20 mEq Oral BID  . rosuvastatin  40 mg Oral Daily  . sacubitril-valsartan  1 tablet Oral BID  . silver sulfADIAZINE   Topical Daily  . sodium chloride flush  10-40 mL Intracatheter Q12H  . sodium chloride flush  3 mL Intravenous Q12H  . spironolactone  25 mg Oral Daily  . triamcinolone ointment   Topical BID    Infusions: . sodium chloride    . milrinone 0.25 mcg/kg/min (01/12/18 0215)    PRN Medications: sodium chloride, acetaminophen, alum & mag hydroxide-simeth, ondansetron (ZOFRAN) IV, sodium chloride flush, sodium chloride flush, traMADol   Assessment/Plan   1. Acute on chronic systolic CHF: Ischemic cardiomyopathy.  Has Medtronic ICD.  Echo (6/19) with EF 20%, moderately dilated RV. CPX 5/19 with severe HF limitation and multiple high risk features.  NYHA class IIIb.  RHC 6/7 showed ongoing elevated filling pressures with low cardiac output. PAPi 2 on RHC, has RV dysfunction but not marked. - Doing well on milrinone. Co-ox now 68% but he has been getting 0.75 mcg/kg/min instead of 0.25 mcg/kg/min as ordered. I discussed with nursing staff and we reduced to 0.25 and safety zone placed - He said he would consider going home on inotropes but prefers not to - Has  had good diuresis with 26 pound weight loss. CVP now 8-9. Will stop IV lasix. Resume po lasix 80 bid.  - With soft BP, have decreased Coreg and Entresto doses. I decrease carvedilol dose further to 3.125 bid.  Continue current spironolactone.   -W/u for advanced therapies underway. Will need to pay attention to RV and renal function => I do not think RV is prohibitive based on cath today. Dr. Prescott Gum has seen. He would be a transplant candidate as well I think, but given low output and recent marked worsening at home, not sure we will have the time to wait.   - Blood  type A neg  - Patient seen by Dr. Prescott Gum and Hospice team and confirms he is not interested in advanced therapies at this time. I suspect he may need home inotropes. Will attempt to wean mirlinone and follow co-ox  2. CKD: Stage 3.  - Creatinine 1.29-1.42 today will stop IV lasix. Resume po  3. CAD: s/p CABG.  Angiography 6/7  with patent grafts but very severe native CAD.  No interventional target.    - No s/s ischemia - Continue ASA and statin.  4. Bilateral lower extremity wounds: Suspect venous stasis versus drug reaction from ?meclizine.   - Wound consult has seen.  - Unable to obtain ABIs with the wounds.  - Given some asymmetric lower leg swelling will also order venous dopplers => negative   5. DM: hgbA1c around 9.  Diabetes coordinator following.  Glucose reasonably controlled currently.  - Consider Jardiance in setting of CAD and HF  Length of Stay: [place], MD  01/12/2018, 1:03 PM  Advanced Heart Failure Team Pager [phone] (M-F; 7a - 4p)  Please contact [facility] for night-coverage after hours (4p -7a ) and weekends on [email or web address]
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
## 2018-01-12 NOTE — Plan of Care (Signed)
  Problem: Nutrition: Goal: Adequate nutrition will be maintained Outcome: Completed/Met

## 2018-01-13 LAB — BASIC METABOLIC PANEL
ANION GAP: 9 (ref 5–15)
BUN: 33 mg/dL — AB (ref 6–20)
CHLORIDE: 97 mmol/L — AB (ref 101–111)
CO2: 30 mmol/L (ref 22–32)
Calcium: 8.9 mg/dL (ref 8.9–10.3)
Creatinine, Ser: 1.35 mg/dL — ABNORMAL HIGH (ref 0.61–1.24)
GFR, EST NON AFRICAN AMERICAN: 57 mL/min — AB (ref >60)
Glucose, Bld: 185 mg/dL — ABNORMAL HIGH (ref 65–99)
Potassium: 4.8 mmol/L (ref 3.5–5.1)
SODIUM: 136 mmol/L (ref 135–145)

## 2018-01-13 LAB — COOXEMETRY PANEL
CARBOXYHEMOGLOBIN: 1.8 % — AB (ref 0.5–1.5)
CARBOXYHEMOGLOBIN: 1.6 % — AB (ref 0.5–1.5)
METHEMOGLOBIN: 1.2 % (ref 0.0–1.5)
METHEMOGLOBIN: 1.2 % (ref 0.0–1.5)
O2 SAT: 84.9 %
O2 Saturation: 64.6 %
Total hemoglobin: 14.3 g/dL (ref 12.0–16.0)
Total hemoglobin: 14.5 g/dL (ref 12.0–16.0)

## 2018-01-13 LAB — GLUCOSE, CAPILLARY
GLUCOSE-CAPILLARY: 191 mg/dL — AB (ref 65–99)
GLUCOSE-CAPILLARY: 197 mg/dL — AB (ref 65–99)
GLUCOSE-CAPILLARY: 155 mg/dL — AB (ref 65–99)
Glucose-Capillary: 319 mg/dL — ABNORMAL HIGH (ref 65–99)

## 2018-01-13 LAB — FACTOR 5 LEIDEN

## 2018-01-13 MED ORDER — IVABRADINE HCL 5 MG PO TABS
5.0000 mg | ORAL_TABLET | Freq: Two times a day (BID) | ORAL | Status: DC
  Administered 2018-01-13 – 2018-01-14 (×2): 5 mg via ORAL
  Filled 2018-01-13 (×4): qty 1

## 2018-01-13 MED ORDER — DIGOXIN 125 MCG PO TABS
0.1250 mg | ORAL_TABLET | Freq: Every day | ORAL | Status: DC
  Administered 2018-01-13 – 2018-01-14 (×2): 0.125 mg via ORAL
  Filled 2018-01-13 (×2): qty 1

## 2018-01-13 NOTE — Progress Notes (Signed)
Physical Therapy Treatment Patient Details Name: Marcus Johnson MRN: [id number] DOB: 09-24-1960 Today's Date: 01/13/2018    History of Present Illness Pt is a 58 y/o male admitted secondary to CHF exacerbation. Chest imaging revealed enlargement of cardiac silhouette with pulmonary vascular congestion. PMH includes HTN, ischemic cardiomyopathy s/p ICD, CKD, CAD s/p CABG, and bilat LE wounds.     PT Comments    Pt doing excellent with mobility. Pt able to go up/down 34 stairs. Amb independently. Instructed pt to have nurse place him on telemetry box so he can amb in hallway at least 3x/day. No further PT indicated.   Follow Up Recommendations  No PT follow up     Equipment Recommendations  3in1 (PT)    Recommendations for Other Services       Precautions / Restrictions Precautions Precautions: None Restrictions Weight Bearing Restrictions: No    Mobility  Bed Mobility Overal bed mobility: Independent             General bed mobility comments: Sitting EOB upon entry   Transfers Overall transfer level: Independent Equipment used: None Transfers: Sit to/from Stand Sit to Stand: Independent         General transfer comment: Supervision for safety.   Ambulation/Gait Ambulation/Gait assistance: Independent Ambulation Distance (Feet): 700 Feet Assistive device: None Gait Pattern/deviations: WFL(Within Functional Limits) Gait velocity: Decreased  Gait velocity interpretation: >4.37 ft/sec, indicative of normal walking speed General Gait Details: Steady gait without difficulty   Stairs Stairs: Yes Stairs assistance: Supervision Stair Management: One rail Left;Alternating pattern;Step to pattern;Forwards Number of Stairs: 34 General stair comments: supervision for lines   Wheelchair Mobility    Modified Rankin (Stroke Patients Only)       Balance Overall balance assessment: Independent Sitting-balance support: No upper extremity supported;Feet  supported Sitting balance-Leahy Scale: Good     Standing balance support: Single extremity supported;No upper extremity supported;During functional activity Standing balance-Leahy Scale: Fair Standing balance comment: Able to maintain static standing without UE support.                             Cognition Arousal/Alertness: Awake/alert Behavior During Therapy: WFL for tasks assessed/performed Overall Cognitive Status: Within Functional Limits for tasks assessed                                        Exercises      General Comments        Pertinent Vitals/Pain Pain Assessment: No/denies pain    Home Living                      Prior Function            PT Goals (current goals can now be found in the care plan section) Acute Rehab PT Goals PT Goal Formulation: With patient Time For Goal Achievement: 01/22/18 Potential to Achieve Goals: Good Progress towards PT goals: Goals met/education completed, patient discharged from PT    Frequency    Min 3X/week      PT Plan Other (comment)(Pt dc'd from PT)    Co-evaluation              AM-PAC PT "6 Clicks" Daily Activity  Outcome Measure  Difficulty turning over in bed (including adjusting bedclothes, sheets and blankets)?: None Difficulty moving from lying on back to sitting  on the side of the bed? : None Difficulty sitting down on and standing up from a chair with arms (e.g., wheelchair, bedside commode, etc,.)?: None Help needed moving to and from a bed to chair (including a wheelchair)?: None Help needed walking in hospital room?: None Help needed climbing 3-5 steps with a railing? : None 6 Click Score: 24    End of Session Equipment Utilized During Treatment: Gait belt Activity Tolerance: Patient tolerated treatment well Patient left: in bed;with call bell/phone within reach(sitting EOB) Nurse Communication: Mobility status;Other (comment)(If placed on telemetry  box pt can amb in halls independently) PT Visit Diagnosis: Other abnormalities of gait and mobility (R26.89)     Time: 1308-6578 PT Time Calculation (min) (ACUTE ONLY): 15 min  Charges:  $Gait Training: 8-22 mins                    G Codes:       [facility] PT [place] 01/13/2018, 4:17 PM

## 2018-01-13 NOTE — Progress Notes (Signed)
Inpatient Diabetes Program Recommendations  AACE/ADA: New Consensus Statement on Inpatient Glycemic Control (2015)  Target Ranges:  Prepandial:   less than 140 mg/dL      Peak postprandial:   less than 180 mg/dL (1-2 hours)      Critically ill patients:  140 - 180 mg/dL   Lab Results  Component Value Date   GLUCAP 319 (H) 01/13/2018   HGBA1C 9.1 (H) 01/07/2018    Review of Glycemic Control Results for Marcus Johnson, Marcus Johnson (MRN [id number]) as of 01/13/2018 12:41  Ref. Range 01/12/2018 17:22 01/12/2018 17:24 01/12/2018 21:57 01/13/2018 07:38 01/13/2018 11:32  Glucose-Capillary Latest Ref Range: 65 - 99 mg/dL 217 (H) 211 (H) 224 (H) 191 (H) 319 (H)   Diabetes history: DM2 Outpatient Diabetes medications: Lantus 60 units QHS, Novolog 5 units TID with meals Current orders for Inpatient glycemic control: Novolog 0-20 units TID with meals, Novolog 0-5 units QHS, Novolog 4 units TID with meals   Inpatient Diabetes Program Recommendations:   Within last 24 hours patient has received a total of 56 units of short acting insulin. Consider adding back a portion of basal to inpatient regimen: Lantus 18 units QHS (92.7 kg x 0.2).  Thanks, Bronson Curb, MSN, RNC-OB Diabetes Coordinator [phone] (8a-5p)

## 2018-01-13 NOTE — Progress Notes (Signed)
Had a lengthy conversation with Marcus Johnson regarding LVAD. Marcus Johnson is adamantly opposed to having a VAD placed. He kept using phrases stating that this isn't up to him its up to God. Pt was educated on the VAD, placement and outcomes. Pt was informed that if his condition worsens he may not be a candidate anymore for the pump if he misses his window. Pt is thankful for all that we have done thus far, we will continue to follow him closely.   Tanda Rockers RN, BSN VAD Coordinator 24/7 Pager [phone]

## 2018-01-13 NOTE — Progress Notes (Addendum)
Patient ID: Marcus Johnson, male   DOB: [birth year], 57 y.o.   MRN: [id number]     Advanced Heart Failure Rounding Note  PCP-Cardiologist: No primary care provider on file.   Subjective:    01/12/18 was ordered for milrinone 0.25 (but pump programmed at 0.75)  Negative 600 cc and down another 4 lbs (29 total). CVP 4-5. Coox 84%   Feeling good today. Anxious to go home. Does not want pump/transplant. He verbalizes understanding that he has a "window" in which his body and the right side of his heart would tolerate a pump.  Creatinine 1.29->1.42-> 1.35  Has spoken to Dr. Prescott Gum and said he was not interested in VAD or transplant at this time.   Echo 01/07/18: Mildly dilated LV with EF 20%, moderate RV dysfunction, moderate TR, PASP 52 mmHg with dilated IVC.   RHC/LHC (6/7):   Dominance: Right  Left Main  Totally occluded.  Left Anterior Descending  Patent LIMA to ?large diagonal. The touchdown vessel has diffuse disease up to 60-70% in several areas.  Left Circumflex  Patent SVG-OM. The OM back-fills to the AV LCx though there is a 95% ostial OM stenosis.  Right Coronary Artery  Diffuse disease in the RCA system. 70% proximal RCA stenosis. There is a high take-off of a PLV branch. This branch has mid 80% stenosis. Serial 80-90% stenoses in the PDA. SVG-PLV is patent.  Intervention   No interventions have been documented.  Right Heart   Right Heart Pressures RHC Procedural Findings: Hemodynamics (mmHg) RA mean 15 RV 57/17 PA 54/24, mean 37 PCWP mean 32 LV 103/23 AO 106/64  Oxygen saturations: PA 57% AO 99%  Cardiac Output (Fick) 4.04  Cardiac Index (Fick) 1.86  Cardiac Output (Thermo) 3.56 Cardiac Index (Thermo) 1.64  PAPi 2 PVR 1.25 WU CVP/PCWP 0.47    Objective:   Weight Range: 204 lb 9.6 oz (92.8 kg) Body mass index is 29.36 kg/m.   Vital Signs:   Temp:  [98.4 F (36.9 C)-99.2 F (37.3 C)] 99 F (37.2 C) (06/10 0502) Pulse Rate:  [94-105] 105 (06/10  0502) Resp:  [16-25] 25 (06/10 0502) BP: (104-111)/(62-69) 104/65 (06/10 0502) SpO2:  [93 %-96 %] 93 % (06/10 0502) Weight:  [204 lb 9.6 oz (92.8 kg)] 204 lb 9.6 oz (92.8 kg) (06/10 0709) Last BM Date: 01/07/18  Weight change: Filed Weights   01/11/18 0514 01/12/18 0400 01/13/18 0709  Weight: 209 lb 8 oz (95 kg) 207 lb 1.6 oz (93.9 kg) 204 lb 9.6 oz (92.8 kg)    Intake/Output:   Intake/Output Summary (Last 24 hours) at 01/13/2018 0719 Last data filed at 01/13/2018 0529 Gross per 24 hour  Intake 1090 ml  Output 1700 ml  Net -610 ml      Physical Exam    General: Lying in bed. NAD.  HEENT: Normal Neck: Supple. JVP not elevated. Carotids 2+ bilat; no bruits. No thyromegaly or nodule noted. Cor: PMI lateral. Regular, tachy +S3 Lungs: CTAB, normal effort. Abdomen: Soft, non-tender, non-distended, no HSM. No bruits or masses. +BS  Extremities: No cyanosis, clubbing, or rash. BLE edema. Wrapped due to open sores.  Neuro: Alert & orientedx3, cranial nerves grossly intact. moves all 4 extremities w/o difficulty. Affect pleasant   Telemetry   NSR sinus tach 110s, personally reviewed.   Labs    CBC Recent Labs    01/11/18 0508 01/12/18 0359  WBC 7.6 8.0  NEUTROABS 4.3 4.8  HGB 12.6* 13.2  HCT 40.6 42.7  MCV 79.9 80.1  PLT 213 191   Basic Metabolic Panel Recent Labs    01/12/18 0359 01/13/18 0451  NA 136 136  K 4.4 4.8  CL 98* 97*  CO2 30 30  GLUCOSE 190* 185*  BUN 33* 33*  CREATININE 1.42* 1.35*  CALCIUM 8.5* 8.9   Liver Function Tests No results for input(s): AST, ALT, ALKPHOS, BILITOT, PROT, ALBUMIN in the last 72 hours. No results for input(s): LIPASE, AMYLASE in the last 72 hours. Cardiac Enzymes No results for input(s): CKTOTAL, CKMB, CKMBINDEX, TROPONINI in the last 72 hours.  BNP: BNP (last 3 results) Recent Labs    01/07/18 1530  BNP 1,784.5*    ProBNP (last 3 results) No results for input(s): PROBNP in the last 8760  hours.   D-Dimer No results for input(s): DDIMER in the last 72 hours. Hemoglobin A1C No results for input(s): HGBA1C in the last 72 hours. Fasting Lipid Panel No results for input(s): CHOL, HDL, LDLCALC, TRIG, CHOLHDL, LDLDIRECT in the last 72 hours. Thyroid Function Tests No results for input(s): TSH, T4TOTAL, T3FREE, THYROIDAB in the last 72 hours.  Invalid input(s): FREET3  Other results:   Imaging    No results found.   Medications:     Scheduled Medications: . allopurinol  600 mg Oral Daily  . aspirin EC  81 mg Oral Daily  . carvedilol  3.125 mg Oral BID WC  . enoxaparin (LOVENOX) injection  40 mg Subcutaneous Q24H  . furosemide  80 mg Oral BID  . insulin aspart  0-20 Units Subcutaneous TID WC  . insulin aspart  0-5 Units Subcutaneous QHS  . insulin aspart  4 Units Subcutaneous TID WC  . potassium chloride SA  20 mEq Oral BID  . rosuvastatin  40 mg Oral Daily  . sacubitril-valsartan  1 tablet Oral BID  . silver sulfADIAZINE   Topical Daily  . sodium chloride flush  10-40 mL Intracatheter Q12H  . sodium chloride flush  3 mL Intravenous Q12H  . spironolactone  25 mg Oral Daily  . triamcinolone ointment   Topical BID    Infusions: . sodium chloride    . milrinone 0.25 mcg/kg/min (01/13/18 0452)    PRN Medications: sodium chloride, acetaminophen, alum & mag hydroxide-simeth, ondansetron (ZOFRAN) IV, sodium chloride flush, sodium chloride flush, traMADol   Assessment/Plan   1. Acute on chronic systolic CHF: Ischemic cardiomyopathy.   - s/p Medtronic ICD - Echo (01/07/18) with EF 20%, moderately dilated RV. CPX 5/19 with severe HF limitation and multiple high risk features.  NYHA class IIIb.  RHC 6/7 showed ongoing elevated filling pressures with low cardiac output. PAPi 2 on RHC, has RV dysfunction but not marked. - Coox 84% this am on milrinone 0.25 (Dosing confirmed on pump). Will decrease to 0.125 mcg/kg/min and follow.  - He said he would consider going  home on inotropes but prefers not to - Has diuresed 29 lbs. CVP 3-4.  - Continue lasix 80 mg po BID.  - Continue coreg 3.125 mg BID.  - Continue Entresto 49/51 mg BID. No room to up-titrate.  - Continue spiro 25 mg daily.  -W/u for advanced therapies underway. Will need to pay attention to RV and renal function => RV function not prohibitive on cath 01/10/18. Dr. Prescott Gum has seen. He would be a transplant candidate as well, but given low output and recent marked worsening at home, not sure we will have the time to wait.   - Blood type A neg  -  Patient seen by Dr. Prescott Gum and Hospice team and confirms he is not interested in advanced therapies at this time. Suspect he may need home inotropes. Will attempt to wean mirlinone and follow co-ox   2. CKD: Stage 3.  - Creatinine 1.35. Switched to po lasix 01/12/18. 3. CAD: s/p CABG - Angiography 6/7  with patent grafts but very severe native CAD.  No interventional target.    - No s/s of ischemia.    - Continue ASA and statin.  4. Bilateral lower extremity wounds:  - Suspect venous stasis versus drug reaction from ?meclizine.   - WOC has seen.  - Unable to obtain ABIs with the wounds.  - Given some asymmetric lower leg swelling will also order venous dopplers => negative   5. DM: - HgbA1c 9.1 01/07/18. - Diabetes coordinator following.  Glucose reasonably controlled currently.  - Consider Jardiance in setting of CAD and HF  Will attempt milrinone wean and follow coox. May need home inotrope support, but he hopes to avoid.   Length of Stay: [address]  Annamaria Helling  01/13/2018, 7:19 AM  Advanced Heart Failure Team Pager [phone] (M-F; 7a - 4p)  Please contact [facility] for night-coverage after hours (4p -7a ) and weekends on [email or web address]   Patient seen with PA, agree with the above note.  He is doing well today, good co-ox.  CVP not elevated, now on po Lasix.  On exam, he is not volume overloaded.  Mild tachycardia, in sinus.   We  had a discussion again today about LVAD and transplant.  He understands that he probably has a narrow window at this point.  He is adamant that he does not want anything done at this time and does not want transplant evaluation.  He seems to understand the implications.  He has agreed to close followup and does not rule out LVAD if he decompensates again.  I do fear that we may miss his window for advanced therapies based on his renal and RV function and told him this.   I will start digoxin today and decrease milrinone to 0.125.  Repeat co-ox in afternoon, and if co-ox remains good, will try stopping milrinone.  Would like to avoid milrinone at home if possible if it is not going to be a bridge to advanced therapy.  With mild sinus tachycardia and decreased Coreg, will add ivabradine 5 mg bid.   Loralie Champagne 01/13/2018 8:38 AM

## 2018-01-13 NOTE — Plan of Care (Signed)
  Problem: Pain Managment: Goal: General experience of comfort will improve Outcome: Progressing   

## 2018-01-14 LAB — BASIC METABOLIC PANEL
Anion gap: 10 (ref 5–15)
BUN: 36 mg/dL — AB (ref 6–20)
CHLORIDE: 99 mmol/L — AB (ref 101–111)
CO2: 27 mmol/L (ref 22–32)
Calcium: 8.9 mg/dL (ref 8.9–10.3)
Creatinine, Ser: 1.53 mg/dL — ABNORMAL HIGH (ref 0.61–1.24)
GFR calc non Af Amer: 49 mL/min — ABNORMAL LOW (ref >60)
GFR, EST AFRICAN AMERICAN: 57 mL/min — AB (ref >60)
GLUCOSE: 196 mg/dL — AB (ref 65–99)
Potassium: 4.9 mmol/L (ref 3.5–5.1)
Sodium: 136 mmol/L (ref 135–145)

## 2018-01-14 LAB — GLUCOSE, CAPILLARY
GLUCOSE-CAPILLARY: 228 mg/dL — AB (ref 65–99)
Glucose-Capillary: 195 mg/dL — ABNORMAL HIGH (ref 65–99)

## 2018-01-14 LAB — COOXEMETRY PANEL
Carboxyhemoglobin: 1.9 % — ABNORMAL HIGH (ref 0.5–1.5)
Methemoglobin: 0.8 % (ref 0.0–1.5)
O2 SAT: 64.2 %
TOTAL HEMOGLOBIN: 14.5 g/dL (ref 12.0–16.0)

## 2018-01-14 MED ORDER — INSULIN GLARGINE 100 UNIT/ML ~~LOC~~ SOLN: 18.0000 [IU] | Freq: Every day | SUBCUTANEOUS | Status: DC

## 2018-01-14 MED ORDER — ASPIRIN 81 MG PO TBEC: 81.0000 mg | DELAYED_RELEASE_TABLET | Freq: Every day | ORAL | 3 refills | Status: DC

## 2018-01-14 MED ORDER — TORSEMIDE 20 MG PO TABS: 80.0000 mg | ORAL_TABLET | Freq: Two times a day (BID) | ORAL | 6 refills | Status: DC

## 2018-01-14 MED ORDER — DIGOXIN 125 MCG PO TABS: 0.1250 mg | ORAL_TABLET | Freq: Every day | ORAL | 3 refills | Status: DC

## 2018-01-14 MED ORDER — POTASSIUM CHLORIDE CRYS ER 20 MEQ PO TBCR: 20.0000 meq | EXTENDED_RELEASE_TABLET | Freq: Two times a day (BID) | ORAL | 3 refills | Status: DC

## 2018-01-14 MED ORDER — CARVEDILOL 3.125 MG PO TABS: 3.1250 mg | ORAL_TABLET | Freq: Two times a day (BID) | ORAL | 6 refills | Status: DC

## 2018-01-14 MED ORDER — IVABRADINE HCL 5 MG PO TABS: 5.0000 mg | ORAL_TABLET | Freq: Two times a day (BID) | ORAL | 3 refills | Status: DC

## 2018-01-14 MED ORDER — TORSEMIDE 20 MG PO TABS: 80.0000 mg | ORAL_TABLET | Freq: Two times a day (BID) | ORAL | Status: DC

## 2018-01-14 MED ORDER — SACUBITRIL-VALSARTAN 49-51 MG PO TABS: 1.0000 | ORAL_TABLET | Freq: Two times a day (BID) | ORAL | 6 refills | Status: DC

## 2018-01-14 MED ORDER — FUROSEMIDE 80 MG PO TABS: 80.0000 mg | ORAL_TABLET | Freq: Two times a day (BID) | ORAL | Status: DC

## 2018-01-14 MED FILL — Heparin Sod (Porcine)-NaCl IV Soln 1000 Unit/500ML-0.9%: INTRAVENOUS | Qty: 1000 | Status: AC

## 2018-01-14 NOTE — Discharge Summary (Signed)
Advanced Heart Failure Discharge Note  Discharge Summary   Patient ID: Marcus Johnson MRN: [id number], DOB/AGE: 10-07-1960 18 y.o. Admit date: 01/07/2018 D/C date:     01/14/2018   Primary Discharge Diagnoses:  1. Acute on chronic systolic CHF: Ischemic cardiomyopathy.   2. CKD: Stage 3.  3. CAD: s/p CABG 4. Bilateral lower extremity wounds 5. DM  Hospital Course:   Marcus Johnson is a 57 y.o. male with h/o DM2, CAD s/p CABG 7681, systolic HF due to ischemic cardiomyopathy with EF 20-25% (Echo 12/15), DM2 and CKD. He is s/p Medtronic single chamber ICD.  Admitted from CHF clinic 01/07/18 with NYHA IIIb symptoms and volume overload. Problem based hospital course as below.   1. Acute on chronic systolic CHF: Ischemic cardiomyopathy.   - s/p Medtronic ICD - Echo (01/07/18) with EF 20%, moderately dilated RV. CPX 5/19 with severe HF limitation and multiple high risk features.  NYHA class IIIb.  RHC 6/7 showed ongoing elevated filling pressures with low cardiac output. PAPi 2 on RHC, has RV dysfunction but not marked. - He was initially started on milrinone, but tolerated wean, as he refused to use at home for now.  - He diuresed > 30 lbs. Meds adjusted as tolerated.  -W/u for advanced therapies underway. Will need to pay attention to RV and renal function => RV function not prohibitive on cath 01/10/18. Dr. Prescott Gum has seen. He would be a transplant candidate as well, but given low output and recent marked worsening at home, not sure we will have the time to wait.   - Blood type A neg  - Patient seen by Dr. Prescott Gum and Hospice team and confirms he is not interested in advanced therapies at this time. Had long discussion with multiple team members concerning prognosis and "window" for advanced therapies.  2. CKD: Stage 3.  - Creatinine 1.53 this am. Switched to po lasix 01/12/18. He is on torsemide at home. Will hold am dose, then resume torsemide 80 mg BID per Dr Aundra Dubin.  3. CAD: s/p CABG -  Angiography 6/7 with patent grafts but very severe native CAD, though no interventional target.  - He had no s/s of ischemia.  ASA and statin were continued.  4. Bilateral lower extremity wounds:  - Suspect venous stasis versus drug reaction from ?meclizine.   - WOC saw with no change. Unable to perform ABIs with open wounds. Venous dopplers negative for DVT.  5. DM: - HgbA1c 9.1 01/07/18. Diabetes coordinator input appreciated.   Examined am of 01/14/18 and determined stable for home with stable mixed venous saturation s/p milrinone wean. He will be seen in very close follow up as below.   Discharge Weight Range: 202 lb  Discharge Vitals: Blood pressure 111/71, pulse 96, temperature 98.3 F (36.8 C), temperature source Oral, resp. rate 16, height 5\' 10"  (1.778 m), weight 202 lb 6.4 oz (91.8 kg), SpO2 96 %.  Labs: Lab Results  Component Value Date   WBC 8.0 01/12/2018   HGB 13.2 01/12/2018   HCT 42.7 01/12/2018   MCV 80.1 01/12/2018   PLT 232 01/12/2018    Recent Labs  Lab 01/07/18 1530  01/14/18 0535  NA 140   < > 136  K 4.2   < > 4.9  CL 103   < > 99*  CO2 27   < > 27  BUN 36*   < > 36*  CREATININE 1.45*   < > 1.53*  CALCIUM 9.1   < >  8.9  PROT 7.8  --   --   BILITOT 2.1*  --   --   ALKPHOS 210*  --   --   ALT 21  --   --   AST 32  --   --   GLUCOSE 158*   < > 196*   < > = values in this interval not displayed.   Lab Results  Component Value Date   CHOL 93 01/08/2018   HDL 19 (L) 01/08/2018   LDLCALC 54 01/08/2018   TRIG 98 01/08/2018   BNP (last 3 results) Recent Labs    01/07/18 1530  BNP 1,784.5*    ProBNP (last 3 results) No results for input(s): PROBNP in the last 8760 hours.   Diagnostic Studies/Procedures   Echo 01/07/18: Mildly dilated LV with EF 20%, moderate RV dysfunction, moderate TR, PASP 52 mmHg with dilated IVC.   RHC/LHC (6/7):   Dominance: Right  Left Main  Totally occluded.  Left Anterior Descending  Patent LIMA to ?large diagonal.  The touchdown vessel has diffuse disease up to 60-70% in several areas.  Left Circumflex  Patent SVG-OM. The OM back-fills to the AV LCx though there is a 95% ostial OM stenosis.  Right Coronary Artery  Diffuse disease in the RCA system. 70% proximal RCA stenosis. There is a high take-off of a PLV branch. This branch has mid 80% stenosis. Serial 80-90% stenoses in the PDA. SVG-PLV is patent.  Intervention   No interventions have been documented.  Right Heart   Right Heart Pressures RHC Procedural Findings: Hemodynamics (mmHg) RA mean 15 RV 57/17 PA 54/24, mean 37 PCWP mean 32 LV 103/23 AO 106/64  Oxygen saturations: PA 57% AO 99%  Cardiac Output (Fick) 4.04  Cardiac Index (Fick) 1.86  Cardiac Output (Thermo) 3.56 Cardiac Index (Thermo) 1.64  PAPi 2 PVR 1.25 WU CVP/PCWP 0.47    Discharge Medications   Allergies as of 01/14/2018      Reactions   Meclizine Hcl Anaphylaxis, Swelling      Medication List    STOP taking these medications   cephALEXin 500 MG capsule Commonly known as:  KEFLEX   hydrALAZINE 50 MG tablet Commonly known as:  APRESOLINE   isosorbide mononitrate 120 MG 24 hr tablet Commonly known as:  IMDUR   sacubitril-valsartan 97-103 MG Commonly known as:  ENTRESTO Replaced by:  sacubitril-valsartan 49-51 MG     TAKE these medications   allopurinol 300 MG tablet Commonly known as:  ZYLOPRIM Take 2 tablets (600 mg total) by mouth daily.   aspirin 81 MG EC tablet Take 1 tablet (81 mg total) by mouth daily. Start taking on:  01/15/2018 What changed:    medication strength  how much to take   BREEZE 2 TEST Disk Generic drug:  Glucose Blood USE TO TEST BLOOD SUGAR 3 TIMES DAILY.   carvedilol 3.125 MG tablet Commonly known as:  COREG Take 1 tablet (3.125 mg total) by mouth 2 (two) times daily with a meal. What changed:    medication strength  how much to take   digoxin 0.125 MG tablet Commonly known as:  LANOXIN Take 1 tablet  (0.125 mg total) by mouth daily. Start taking on:  01/15/2018   Insulin Glargine 100 UNIT/ML Solostar Pen Commonly known as:  LANTUS SOLOSTAR INJECT 60 UNITS INTO THE SKIN DAILY AT 10 PM   ivabradine 5 MG Tabs tablet Commonly known as:  CORLANOR Take 1 tablet (5 mg total) by mouth 2 (two) times  daily with a meal.   NOVOLOG FLEXPEN 100 UNIT/ML FlexPen Generic drug:  insulin aspart INJECT 5 UNITS INTO THE SKIN 3 TIMES A DAY WITH MEALS What changed:  See the new instructions.   ondansetron 8 MG tablet Commonly known as:  ZOFRAN Take 1 tablet (8 mg total) every 8 (eight) hours as needed by mouth for nausea or vomiting.   potassium chloride SA 20 MEQ tablet Commonly known as:  K-DUR,KLOR-CON Take 1 tablet (20 mEq total) by mouth daily. What changed:  when to take this   rosuvastatin 40 MG tablet Commonly known as:  CRESTOR Take 1 tablet (40 mg total) by mouth daily.   sacubitril-valsartan 49-51 MG Commonly known as:  ENTRESTO Take 1 tablet by mouth 2 (two) times daily. Replaces:  sacubitril-valsartan 97-103 MG   silver sulfADIAZINE 1 % cream Commonly known as:  SILVADENE Apply 1 application topically daily.   spironolactone 25 MG tablet Commonly known as:  ALDACTONE Take 1 tablet (25 mg total) by mouth daily.   torsemide 20 MG tablet Commonly known as:  DEMADEX Take 4 tablets (80 mg total) by mouth 2 (two) times daily. What changed:    how much to take  how to take this  when to take this  additional instructions   traMADol 50 MG tablet Commonly known as:  ULTRAM Take 2 tablets (100 mg total) by mouth every 6 (six) hours as needed for moderate pain.       Disposition   The patient will be discharged in stable condition to home.  Discharge Instructions    (HEART FAILURE PATIENTS) Call MD:  Anytime you have any of the following symptoms: 1) 3 pound weight gain in 24 hours or 5 pounds in 1 week 2) shortness of breath, with or without a dry hacking cough 3)  swelling in the hands, feet or stomach 4) if you have to sleep on extra pillows at night in order to breathe.   Complete by:  As directed    Diet - low sodium heart healthy   Complete by:  As directed    Increase activity slowly   Complete by:  As directed      Follow-up Information    Larey Dresser, MD Follow up on 01/21/2018.   Specialty:  Cardiology Why:  2:00pm-VAD Consult with Dr. Aundra Dubin & VAD Coordinators.  Give 24 hr notice for cancellation/reschedule.Parking at ER lot [facility] under blue "Specialty Clinics" awning) or under [place] on [address], materials entrance, garage code 1300) Contact information: [place] [address] [place] Follow up on 01/27/2018.   Specialty:  Cardiology Why:  Heart Failure Follow up at [facility]-11:00-Parking at ER lot (Enter under blue "Specialty Clinics" awning) or under [place] on [address], materials entrance, garage code: 1300, elevator 1st floor). Take am meds, bring all med bottles. Contact information: [address] [id number] [place] [phone]           Duration of Discharge Encounter: Greater than 35 minutes   Signed, Annamaria Helling 01/14/2018, 10:34 AM

## 2018-01-14 NOTE — Progress Notes (Signed)
Received 2 bottles of Corlanor (14 capsules in each bottle) via tube station from [facility] Clinic. Hand-delivered to patient.

## 2018-01-14 NOTE — Plan of Care (Signed)
  Problem: Health Behavior/Discharge Planning: Goal: Ability to manage health-related needs will improve Outcome: Progressing   

## 2018-01-14 NOTE — Care Management Note (Addendum)
Case Management Note  Patient Details  Name: Marcus Johnson MRN: [id number] Date of Birth: August 13, 1960  Subjective/Objective:   CHF                Action/Plan: NCM spoke to pt and states he was Entresto prior to admission. His copay was $8. Dose changed and price $50. Provided pt with Entresto $10 copay card. Contacted his pharmacy and they will  order medication for Entresto. And they do not have Corlanor in stock. Contacted attending and they sent samples for pt to try until they get prior auth completed with [facility] # [phone]. Pt gave permission to complete copay card for $20 with Corlanor, email address [email or web address]. Copay card did not print. Contacted support. Copay card was emailed to pt but received info from [place]. Pt will need to go [place] to receive Entresto. States he plans to go back to work on tomorrow.   [place] [id number] Group [id number] PCN CN ID [id number] Exp January 13, 2019  Expected Discharge Date:  01/14/18               Expected Discharge Plan:  Home/Self Care  In-House Referral:  NA  Discharge planning Services  CM Consult  Post Acute Care Choice:  NA Choice offered to:  NA  DME Arranged:  N/A DME Agency:  NA  HH Arranged:  NA HH Agency:  NA  Status of Service:  Completed, signed off  If discussed at [place] of Stay Meetings, dates discussed:    Additional Comments:  Erenest Rasher, RN 01/14/2018, 11:55 AM

## 2018-01-14 NOTE — Progress Notes (Addendum)
Patient ID: Marcus Johnson, male   DOB: 1960-09-07, 57 y.o.   MRN: [id number]     Advanced Heart Failure Rounding Note  PCP-Cardiologist: No primary care provider on file.   Subjective:    01/12/18 was ordered for milrinone 0.25 (but pump programmed at 0.75)  Milrinone stopped yesterday afternoon. Negative 1.5 L. Down 2 more lbs (31 lbs total). Coox 64%. CVP not hooked up.   Creatinine 1.29->1.42-> 1.35 -> 1.53  Feels good this morning. No CP, SOB, dizziness. Walked hallways with no problems. Hopeful to go home today.   Has spoken to Dr. Prescott Gum and said he was not interested in VAD or transplant at this time.   Echo 01/07/18: Mildly dilated LV with EF 20%, moderate RV dysfunction, moderate TR, PASP 52 mmHg with dilated IVC.   RHC/LHC (6/7):   Dominance: Right  Left Main  Totally occluded.  Left Anterior Descending  Patent LIMA to ?large diagonal. The touchdown vessel has diffuse disease up to 60-70% in several areas.  Left Circumflex  Patent SVG-OM. The OM back-fills to the AV LCx though there is a 95% ostial OM stenosis.  Right Coronary Artery  Diffuse disease in the RCA system. 70% proximal RCA stenosis. There is a high take-off of a PLV branch. This branch has mid 80% stenosis. Serial 80-90% stenoses in the PDA. SVG-PLV is patent.  Intervention   No interventions have been documented.  Right Heart   Right Heart Pressures RHC Procedural Findings: Hemodynamics (mmHg) RA mean 15 RV 57/17 PA 54/24, mean 37 PCWP mean 32 LV 103/23 AO 106/64  Oxygen saturations: PA 57% AO 99%  Cardiac Output (Fick) 4.04  Cardiac Index (Fick) 1.86  Cardiac Output (Thermo) 3.56 Cardiac Index (Thermo) 1.64  PAPi 2 PVR 1.25 WU CVP/PCWP 0.47    Objective:   Weight Range: 202 lb 6.4 oz (91.8 kg) Body mass index is 29.04 kg/m.   Vital Signs:   Temp:  [97.6 F (36.4 C)-99.2 F (37.3 C)] 98.3 F (36.8 C) (06/11 0446) Pulse Rate:  [94-109] 96 (06/11 0446) Resp:  [16-18] 16  (06/11 0446) BP: (94-111)/(59-71) 111/71 (06/11 0446) SpO2:  [91 %-99 %] 96 % (06/11 0446) Weight:  [202 lb 6.4 oz (91.8 kg)] 202 lb 6.4 oz (91.8 kg) (06/11 0446) Last BM Date: 01/13/18  Weight change: Filed Weights   01/12/18 0400 01/13/18 0709 01/14/18 0446  Weight: 207 lb 1.6 oz (93.9 kg) 204 lb 9.6 oz (92.8 kg) 202 lb 6.4 oz (91.8 kg)    Intake/Output:   Intake/Output Summary (Last 24 hours) at 01/14/2018 0813 Last data filed at 01/14/2018 0449 Gross per 24 hour  Intake 751.39 ml  Output 1675 ml  Net -923.61 ml      Physical Exam    General:  No resp difficulty. HEENT: Normal Neck: Supple. JVP flat. Carotids 2+ bilat; no bruits. No thyromegaly or nodule noted. Cor: PMI laterally displaced. RRR, +S3 Lungs: CTAB, normal effort. Abdomen: Soft, non-tender, non-distended, no HSM. No bruits or masses. +BS  Extremities: No cyanosis, clubbing, or rash. BLE wrapped.  Neuro: Alert & orientedx3, cranial nerves grossly intact. moves all 4 extremities w/o difficulty. Affect pleasant   Telemetry   NSR 90s. Personally reviewed.   Labs    CBC Recent Labs    01/12/18 0359  WBC 8.0  NEUTROABS 4.8  HGB 13.2  HCT 42.7  MCV 80.1  PLT 132   Basic Metabolic Panel Recent Labs    01/13/18 0451 01/14/18 0535  NA  136 136  K 4.8 4.9  CL 97* 99*  CO2 30 27  GLUCOSE 185* 196*  BUN 33* 36*  CREATININE 1.35* 1.53*  CALCIUM 8.9 8.9   Liver Function Tests No results for input(s): AST, ALT, ALKPHOS, BILITOT, PROT, ALBUMIN in the last 72 hours. No results for input(s): LIPASE, AMYLASE in the last 72 hours. Cardiac Enzymes No results for input(s): CKTOTAL, CKMB, CKMBINDEX, TROPONINI in the last 72 hours.  BNP: BNP (last 3 results) Recent Labs    01/07/18 1530  BNP 1,784.5*    ProBNP (last 3 results) No results for input(s): PROBNP in the last 8760 hours.   D-Dimer No results for input(s): DDIMER in the last 72 hours. Hemoglobin A1C No results for input(s): HGBA1C  in the last 72 hours. Fasting Lipid Panel No results for input(s): CHOL, HDL, LDLCALC, TRIG, CHOLHDL, LDLDIRECT in the last 72 hours. Thyroid Function Tests No results for input(s): TSH, T4TOTAL, T3FREE, THYROIDAB in the last 72 hours.  Invalid input(s): FREET3  Other results:   Imaging    No results found.   Medications:     Scheduled Medications: . allopurinol  600 mg Oral Daily  . aspirin EC  81 mg Oral Daily  . carvedilol  3.125 mg Oral BID WC  . digoxin  0.125 mg Oral Daily  . enoxaparin (LOVENOX) injection  40 mg Subcutaneous Q24H  . furosemide  80 mg Oral BID  . insulin aspart  0-20 Units Subcutaneous TID WC  . insulin aspart  0-5 Units Subcutaneous QHS  . insulin aspart  4 Units Subcutaneous TID WC  . ivabradine  5 mg Oral BID WC  . potassium chloride SA  20 mEq Oral BID  . rosuvastatin  40 mg Oral Daily  . sacubitril-valsartan  1 tablet Oral BID  . silver sulfADIAZINE   Topical Daily  . sodium chloride flush  10-40 mL Intracatheter Q12H  . sodium chloride flush  3 mL Intravenous Q12H  . spironolactone  25 mg Oral Daily  . triamcinolone ointment   Topical BID    Infusions: . sodium chloride      PRN Medications: sodium chloride, acetaminophen, alum & mag hydroxide-simeth, ondansetron (ZOFRAN) IV, sodium chloride flush, sodium chloride flush, traMADol   Assessment/Plan   1. Acute on chronic systolic CHF: Ischemic cardiomyopathy.   - s/p Medtronic ICD - Echo (01/07/18) with EF 20%, moderately dilated RV. CPX 5/19 with severe HF limitation and multiple high risk features.  NYHA class IIIb.  RHC 6/7 showed ongoing elevated filling pressures with low cardiac output. PAPi 2 on RHC, has RV dysfunction but not marked. - Coox 64% off milrinone (stopped yesterday afternoon) - He said he would consider going home on inotropes but prefers not to - Has diuresed 31 lbs. Volume status stable to dry. Creatinine 1.53 this am.  - Hold am diuresis, then resume torsemide 80  mg BID. Discussed with Dr Aundra Dubin.  - Continue coreg 3.125 mg BID.  - Continue Entresto 49/51 mg BID. No room to up-titrate.  - Continue spiro 25 mg daily.  - Continue digoxin 0.125 mg daily - Continue corlanor 5 mg BID. -W/u for advanced therapies underway. Will need to pay attention to RV and renal function => RV function not prohibitive on cath 01/10/18. Dr. Prescott Gum has seen. He would be a transplant candidate as well, but given low output and recent marked worsening at home, not sure we will have the time to wait.   - Blood type A  neg  - Patient seen by Dr. Prescott Gum and Hospice team and confirms he is not interested in advanced therapies at this time. 2. CKD: Stage 3.  - Creatinine 1.53 this am. Switched to po lasix 01/12/18. He is on torsemide at home. Will hold am dose, then resume torsemide 80 mg BID per Dr Aundra Dubin.  3. CAD: s/p CABG - Angiography 6/7  with patent grafts but very severe native CAD.  No interventional target.    - No s/s ischemia - Continue ASA and statin.  4. Bilateral lower extremity wounds:  - Suspect venous stasis versus drug reaction from ?meclizine.   - WOC has seen. No change.  - Unable to obtain ABIs with the wounds.  - Given some asymmetric lower leg swelling will also order venous dopplers => negative   5. DM: - HgbA1c 9.1 01/07/18. - Diabetes coordinator following.  Glucose reasonably controlled currently. Recommended adding back lantus. Order placed.  - Consider Jardiance in setting of CAD and HF  Hopeful to go home today. Uses the [facility] pharmacy on [address].   Dr Aundra Dubin would like him to follow up in [facility] early next week. He will need close followup.   Length of Stay: [place], NP  01/14/2018, 8:13 AM  Advanced Heart Failure Team Pager [phone] (M-F; 7a - 4p)  Please contact [facility] for night-coverage after hours (4p -7a ) and weekends on [email or web address]   Patient seen with PA, agree with the above note.  He feels good and weight  down 31 lbs.  Co-ox 64% today off milrinone.  He is not volume overloaded on exam.  I am concerned with a rise in creatinine to 1.5 off milrinone.    We discussed advanced therapies again.  He understands that he probably has a narrow window at this point.  He is adamant that he does not want anything done at this time and does not want transplant evaluation.  He seems to understand the implications.  He has agreed to close followup and does not rule out LVAD if he decompensates again.  I do fear that we may miss his window for advanced therapies based on his renal and RV function and told him this again.  I will continue his current meds, digoxin and ivabradine started yesterday and tolerated.  Will hold am torsemide with creatinine rise and resume it at 80 mg bid this afternoon.   I think he can go home today.  He will need close followup, I will see him next week in LVAD clinic. Meds for home: torsemide 80 mg bid, KCl 20 bid, ivabradine 5 bid, Coreg 3.125 bid, digoxin 0.125 daily, Entresto 49/51 bid, spironolactone 25 daily, Crestor 40 daily, ASA 81 daily.  Would have pharmacy dose allopurinol appropriately for his renal function.   Loralie Champagne 01/14/2018 8:46 AM

## 2018-01-15 ENCOUNTER — Telehealth (HOSPITAL_COMMUNITY): Payer: Self-pay | Admitting: *Deleted

## 2018-01-15 NOTE — Telephone Encounter (Signed)
Corlanor 5 mg PA approved through [employer] from 01/14/18 through 08/05/38. Ref # [id number].

## 2018-01-21 ENCOUNTER — Other Ambulatory Visit (HOSPITAL_COMMUNITY): Payer: Self-pay | Admitting: Unknown Physician Specialty

## 2018-01-21 ENCOUNTER — Ambulatory Visit (HOSPITAL_COMMUNITY)
Admission: RE | Admit: 2018-01-21 | Discharge: 2018-01-21 | Disposition: A | Discharge status: Home / Self Care | Payer: BLUE CROSS/BLUE SHIELD | Source: Ambulatory Visit | Attending: Internal Medicine | Admitting: Internal Medicine

## 2018-01-21 VITALS — BP 122/78 | HR 104 | Wt 200.2 lb

## 2018-01-21 DIAGNOSIS — I255 Ischemic cardiomyopathy: Secondary | ICD-10-CM | POA: Insufficient documentation

## 2018-01-21 DIAGNOSIS — N183 Chronic kidney disease, stage 3 (moderate): Secondary | ICD-10-CM | POA: Insufficient documentation

## 2018-01-21 DIAGNOSIS — E1122 Type 2 diabetes mellitus with diabetic chronic kidney disease: Secondary | ICD-10-CM | POA: Diagnosis not present

## 2018-01-21 DIAGNOSIS — L97929 Non-pressure chronic ulcer of unspecified part of left lower leg with unspecified severity: Secondary | ICD-10-CM | POA: Insufficient documentation

## 2018-01-21 DIAGNOSIS — I5042 Chronic combined systolic (congestive) and diastolic (congestive) heart failure: Secondary | ICD-10-CM

## 2018-01-21 DIAGNOSIS — I130 Hypertensive heart and chronic kidney disease with heart failure and stage 1 through stage 4 chronic kidney disease, or unspecified chronic kidney disease: Secondary | ICD-10-CM | POA: Insufficient documentation

## 2018-01-21 DIAGNOSIS — E11622 Type 2 diabetes mellitus with other skin ulcer: Secondary | ICD-10-CM | POA: Diagnosis not present

## 2018-01-21 DIAGNOSIS — Z79899 Other long term (current) drug therapy: Secondary | ICD-10-CM | POA: Insufficient documentation

## 2018-01-21 DIAGNOSIS — L97919 Non-pressure chronic ulcer of unspecified part of right lower leg with unspecified severity: Secondary | ICD-10-CM | POA: Diagnosis not present

## 2018-01-21 DIAGNOSIS — Z951 Presence of aortocoronary bypass graft: Secondary | ICD-10-CM | POA: Diagnosis not present

## 2018-01-21 DIAGNOSIS — I2510 Atherosclerotic heart disease of native coronary artery without angina pectoris: Secondary | ICD-10-CM | POA: Diagnosis not present

## 2018-01-21 DIAGNOSIS — Z7982 Long term (current) use of aspirin: Secondary | ICD-10-CM | POA: Diagnosis not present

## 2018-01-21 DIAGNOSIS — R600 Localized edema: Secondary | ICD-10-CM | POA: Diagnosis not present

## 2018-01-21 DIAGNOSIS — I252 Old myocardial infarction: Secondary | ICD-10-CM | POA: Insufficient documentation

## 2018-01-21 DIAGNOSIS — Z95810 Presence of automatic (implantable) cardiac defibrillator: Secondary | ICD-10-CM | POA: Insufficient documentation

## 2018-01-21 DIAGNOSIS — Z794 Long term (current) use of insulin: Secondary | ICD-10-CM | POA: Diagnosis not present

## 2018-01-21 DIAGNOSIS — E1140 Type 2 diabetes mellitus with diabetic neuropathy, unspecified: Secondary | ICD-10-CM | POA: Diagnosis not present

## 2018-01-21 DIAGNOSIS — I5022 Chronic systolic (congestive) heart failure: Secondary | ICD-10-CM | POA: Insufficient documentation

## 2018-01-21 DIAGNOSIS — M109 Gout, unspecified: Secondary | ICD-10-CM | POA: Diagnosis not present

## 2018-01-21 LAB — BRAIN NATRIURETIC PEPTIDE: B NATRIURETIC PEPTIDE 5: 992.3 pg/mL — AB (ref 0.0–100.0)

## 2018-01-21 LAB — COMPREHENSIVE METABOLIC PANEL
ALBUMIN: 3 g/dL — AB (ref 3.5–5.0)
ALT: 20 U/L (ref 17–63)
AST: 26 U/L (ref 15–41)
Alkaline Phosphatase: 167 U/L — ABNORMAL HIGH (ref 38–126)
Anion gap: 8 (ref 5–15)
BILIRUBIN TOTAL: 1.1 mg/dL (ref 0.3–1.2)
BUN: 39 mg/dL — AB (ref 6–20)
CHLORIDE: 104 mmol/L (ref 101–111)
CO2: 25 mmol/L (ref 22–32)
CREATININE: 1.4 mg/dL — AB (ref 0.61–1.24)
Calcium: 9.4 mg/dL (ref 8.9–10.3)
GFR calc Af Amer: >60 mL/min (ref >60)
GFR calc non Af Amer: 54 mL/min — ABNORMAL LOW (ref >60)
GLUCOSE: 183 mg/dL — AB (ref 65–99)
POTASSIUM: 4.4 mmol/L (ref 3.5–5.1)
Sodium: 137 mmol/L (ref 135–145)
Total Protein: 8.5 g/dL — ABNORMAL HIGH (ref 6.5–8.1)

## 2018-01-21 LAB — CBC
HEMATOCRIT: 44.2 % (ref 39.0–52.0)
Hemoglobin: 13.5 g/dL (ref 13.0–17.0)
MCH: 25.1 pg — ABNORMAL LOW (ref 26.0–34.0)
MCHC: 30.5 g/dL (ref 30.0–36.0)
MCV: 82.2 fL (ref 78.0–100.0)
PLATELETS: 310 10*3/uL (ref 150–400)
RBC: 5.38 MIL/uL (ref 4.22–5.81)
RDW: 16 % — ABNORMAL HIGH (ref 11.5–15.5)
WBC: 7.2 10*3/uL (ref 4.0–10.5)

## 2018-01-21 MED ORDER — DIGOXIN 125 MCG PO TABS: 0.1250 mg | ORAL_TABLET | Freq: Every day | ORAL | 3 refills | Status: DC

## 2018-01-21 MED ORDER — CIPROFLOXACIN HCL 500 MG PO TABS: 500.0000 mg | ORAL_TABLET | Freq: Two times a day (BID) | ORAL | 0 refills | Status: AC

## 2018-01-21 MED ORDER — CARVEDILOL 3.125 MG PO TABS: 6.2500 mg | ORAL_TABLET | Freq: Two times a day (BID) | ORAL | 6 refills | Status: DC

## 2018-01-21 NOTE — Patient Instructions (Signed)
1. Restart Digoxin. 2. Increase Coreg to 6.25 mg bid. 3. Start Cipro 500 mg twice a day for 14 days for wound infection. 4. Return to clinic in 3 weeks.

## 2018-01-21 NOTE — Progress Notes (Signed)
Patient presents for hospital follow up  in [facility] today. Pt was adamantly refusing VAD in the hospital and today he states that he is going to be fine now that his fluid is gone. He states he won't be needing the LVAD.   Pt states he feels much better since hospital discharge. He states that the wounds are healing on his legs. He is working everyday now. He states that he is now able to climb the 3 flights of stairs to his apartment without stopping.  Darrick Grinder, NP in to assess wounds. Left leg has purulent green drainage. Urgent wound consult sent to wound clinic. Both legs rinsed with Saline and wrapped with allevyn pads and kerlix.  Vital Signs:  Automatc BP: 122/78 (95) HR:  104  Weight: 200 lb w/o eqt Last weight: 238 lb  VAD Indication: Pt would be DT at this time.    Device: Medtronic Therapies: On Last check:   Labs:   CR: 1.44>1.40  Plan: 1. Restart Digoxin. 2. Increase Coreg to 6.25 mg bid. 3. Start Cipro 500 mg twice a day for 14 days for wound infection. 4. Return to clinic in 3 weeks.    Tanda Rockers RN, BSN VAD Coordinator   Office: [phone] 24/7 Emergency VAD Pager: [phone]

## 2018-01-21 NOTE — Progress Notes (Signed)
Patient ID: Marcus Johnson, male   DOB: 19-Dec-1960, 57 y.o.   MRN: [id number] PCP: Dr. Sarajane Jews Cardiology: Dr. Aundra Dubin  HPI:  Marcus Johnson is a 57 y.o. male with h/o DM2, CAD s/p CABG 0347, systolic HF due to ischemic cardiomyopathy with EF 20-25% (echo 12/15), DM2 and CKD. He is s/p Medtronic single chamber ICD.  Admitted in 12/15 due to ADHF. Required short course milrinone for diuresis. Diuresed 30 pounds.   CPX 2/16 showed severely reduced functional capacity.  There was a significant disconnect between his symptoms and the CPX.  RHC in 6/16 showed fairly normal filling pressures and low but not markedly low cardiac output.   CPX (4/19) was submaximal but suggested severe limitation from HF.   He was admitted in 6/19 with marked volume overload and dyspnea.  Echo in 6/19 showed EF 20% with severe global hypokinesis.  LHC/RHC showed severe native vessel CAD with patent LIMA-D, SVG-OM, and SVG-PLV; cardiac index low.  He was started on milrinone and diuresed.  We discussed LVAD extensively in light of low output, creatinine that is trending up and concern for decompensation of RV over time. He was adamant that he did not want to pursue LVAD workup yet and did not want a referral for transplant evaluation. Milrinone was weaned off and he was discharged home.   He returns for followup of CHF and CAD. He says that he has been doing well since recent discharge.  Weight is down 38 lbs compared to prior to admission.  He is back to working full time at [employer].  He walks up to 500 yards now without dyspnea, this is considerably improved.  No orthopnea/PND.  No chest pain.  No lightheadedness.  He was nauseated when taking Corlanor so stopped it and nausea resolved.  He has not been taking digoxin because a pharmacist told him that it was only for arrhythmias.    He says that his leg ulcers have been healing.  The right lower leg ulcer looks ok. The left leg ulcer is purulent and I am concerned for infection,  ?Pseudomonas based on appearance and smell.  He is afebrile.   Studies: Echo (12/15): EF 20-25% RV moderately HK CPX (2/16): RER 1.10, peak VO2 8.6, VE/VCO2 slope 43.7 => severely reduced functional capacity.  RHC (6/16): mean RA 8, PA 48/19 mean 30, mean PCWP 14, CI 2.03 Fick/2.28 Thermo, PVR 3.6.  Echo (5/17): EF 25%, mild LV dilation, moderate diastolic dysfunction, normal RV size with mild to moderate systolic dysfunction, mild mitral regurgitation CPX (4/19): Peak VO2 7, VE/VCO2 slope 41, RER 1.02 => submaximal but still suggestive of severe HF limitation.  Echo (6/19): EF 20%, diffuse severe HK, moderately decreased RV systolic function, moderate TR.  LHC/RHC (6/19): Severe native vessel CAD with patent LIMA-D, SVG-OM, and SVG-PLV; mean RA 15, PA 54/24 mean 37, mean PCWP 32, CI 1.86 Fick/1.64 thermo. PAPi 2, PVR 1.25 WU.   Labs:  8/15 LDL 88 12/15 K+ 4.2 creatinine 1.09  1/16 K 4.3, creatinine 1.34 4/16 K 4.5, creatinine 1.19 3/17 K 3.8, creatinine 1.22, HCT 35.3, BNP 1597 5/17 K 4.1, creatinine 1.25 6/18 LDL 138, K 4.4, creatinine 1.5 6/19 hgb 13.5, K 4.9, creatinine 1.53 => 1.4  ROS: All systems negative except as listed in HPI, PMH and Problem List.  SH:  Social History   Socioeconomic History  . Marital status: Divorced    Spouse name: Not on file  . Number of children: Not on file  .  Years of education: Not on file  . Highest education level: Not on file  Occupational History  . Not on file  Social Needs  . Financial resource strain: Not on file  . Food insecurity:    Worry: Not on file    Inability: Not on file  . Transportation needs:    Medical: Not on file    Non-medical: Not on file  Tobacco Use  . Smoking status: Never Smoker  . Smokeless tobacco: Never Used  Substance and Sexual Activity  . Alcohol use: No    Alcohol/week: 0.0 oz  . Drug use: No  . Sexual activity: Not on file  Lifestyle  . Physical activity:    Days per week: Not on file     Minutes per session: Not on file  . Stress: Not on file  Relationships  . Social connections:    Talks on phone: Not on file    Gets together: Not on file    Attends religious service: Not on file    Active member of club or organization: Not on file    Attends meetings of clubs or organizations: Not on file    Relationship status: Not on file  . Intimate partner violence:    Fear of current or ex partner: Not on file    Emotionally abused: Not on file    Physically abused: Not on file    Forced sexual activity: Not on file  Other Topics Concern  . Not on file  Social History Narrative   Lives in [place] with his son and his family.  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    FH:  Family History  Problem Relation Age of Onset  . Diabetes Father        died in his 29's  . Hypertension Mother        died in her 54's - had a ppm.    Past Medical History:  Diagnosis Date  . AICD (automatic cardioverter/defibrillator) present    Single-chamber  implantable cardiac defibrillator - Medtronic  . CHF (congestive heart failure) ([place])   . Chronic systolic heart failure ([place])    a. Echo 5/13: Mild LVE, mild LVH, EF 10%, anteroseptal, lateral, apical AK, mild MR, mild LAE, moderately reduced RVSF, mild RAE, PASP 60;  b. 07/2014 Echo: EF 20-2%, diff HK, AKI of antsep/apical/mid-apicalinferior, mod reduced RV.  Marland Kitchen Coronary artery disease    a. s/p CABG 2002 b. LHC 5/13:  dLM 80%, LAD subtotally occluded, pCFX occluded, pRCA 50%, mid? Occlusion with high take off of the PDA with 70% multiple lesions-not bypassed and supplies collaterals to LAD, LIMA-IM/ramus ok, S-OM ok, S-PLA branches ok. Medical therapy was recommended  . Gout    "on daily RX" (01/08/2018)  . Hypertension   . Ischemic cardiomyopathy    a. Echo 5/13: Mild LVE, mild LVH, EF 10%, anteroseptal, lateral, apical AK, mild MR, mild LAE, moderately reduced RVSF, mild RAE, PASP 60;  b. 01/2012 s/p MDT  [id number] Protecta XT VR AICD;  c. 07/2014 Echo: EF 20-2%, diff HK, AKI of antsep/apical/mid-apicalinferior, mod reduced RV.  Marland Kitchen MRSA (methicillin resistant Staphylococcus aureus)    Status post right foot plantar deep infection with MRSA status post  I&D 10/2008  . Myocardial infarction [facility])    "was told I'd had several before heart OR in 2002" (01/08/2018)  . Noncompliance   . Peripheral neuropathy   . Syncope   . Type II diabetes  mellitus ([place])    requiring insulin     Current Outpatient Medications  Medication Sig Dispense Refill  . allopurinol (ZYLOPRIM) 300 MG tablet Take 2 tablets (600 mg total) by mouth daily. 180 tablet 3  . aspirin EC 81 MG EC tablet Take 1 tablet (81 mg total) by mouth daily. 30 tablet 3  . BREEZE 2 TEST DISK USE TO TEST BLOOD SUGAR 3 TIMES DAILY. 100 each 3  . carvedilol (COREG) 3.125 MG tablet Take 2 tablets (6.25 mg total) by mouth 2 (two) times daily with a meal. 60 tablet 6  . Insulin Glargine (LANTUS SOLOSTAR) 100 UNIT/ML Solostar Pen INJECT 60 UNITS INTO THE SKIN DAILY AT 10 PM 15 mL 11  . NOVOLOG FLEXPEN 100 UNIT/ML FlexPen INJECT 5 UNITS INTO THE SKIN 3 TIMES A DAY WITH MEALS (Patient taking differently: INJECT 5 UNITS INTO THE SKIN 3 TIMES A DAY WITH MEALS sometime prn) 15 mL 0  . ondansetron (ZOFRAN) 8 MG tablet Take 1 tablet (8 mg total) every 8 (eight) hours as needed by mouth for nausea or vomiting. 30 tablet 0  . potassium chloride SA (K-DUR,KLOR-CON) 20 MEQ tablet Take 1 tablet (20 mEq total) by mouth daily. (Patient taking differently: Take 20 mEq by mouth 2 (two) times daily. ) 90 tablet 3  . rosuvastatin (CRESTOR) 40 MG tablet Take 1 tablet (40 mg total) by mouth daily. 30 tablet 11  . sacubitril-valsartan (ENTRESTO) 49-51 MG Take 1 tablet by mouth 2 (two) times daily. 60 tablet 6  . silver sulfADIAZINE (SILVADENE) 1 % cream Apply 1 application topically daily. 50 g 2  . spironolactone (ALDACTONE) 25 MG tablet Take 1 tablet (25 mg total) by mouth  daily. 30 tablet 11  . torsemide (DEMADEX) 20 MG tablet Take 4 tablets (80 mg total) by mouth 2 (two) times daily. 240 tablet 6  . traMADol (ULTRAM) 50 MG tablet Take 2 tablets (100 mg total) by mouth every 6 (six) hours as needed for moderate pain. 60 tablet 1  . ciprofloxacin (CIPRO) 500 MG tablet Take 1 tablet (500 mg total) by mouth 2 (two) times daily for 14 days. 28 tablet 0  . digoxin (LANOXIN) 0.125 MG tablet Take 1 tablet (0.125 mg total) by mouth daily. 30 tablet 3   No current facility-administered medications for this encounter.     Vitals:   01/21/18 1422  BP: 122/78  Pulse: (!) 104  Weight: 200 lb 3.2 oz (90.8 kg)    PHYSICAL EXAM: General: NAD Neck: JVP 9-10 cm, no thyromegaly or thyroid nodule.  Lungs: Clear to auscultation bilaterally with normal respiratory effort. CV: Nondisplaced PMI.  Heart regular S1/S2, no S3/S4, no murmur.  1+ ankle edema.  No carotid bruit.  Unable to palpate pedal pulses.  Abdomen: Soft, nontender, no hepatosplenomegaly, no distention.  Skin: Ulcer right lower leg appears to be healing.  Ulcer left lower leg appears purulent.  Neurologic: Alert and oriented x 3.  Psych: Normal affect. Extremities: No clubbing or cyanosis.  HEENT: Normal.   ASSESSMENT & PLAN: 1. Chronic systolic HF:  Ischemic CMP, EF 20-25% (Echo 12/15). Repeat echo 5/17 with EF 25%.  Admit in 12/15, required short-term milrinone for low output.  Medtronic ICD, narrow QRS so not CRT candidate. Markedly abnormal CPX in 2/16 with poor functional capacity due to circulatory limitation, maximal effort. RHC in 6/16 showed near-normal filling pressures and low but not markedly low cardiac output. Most recent echo in 6/19 with EF 20% and moderately decreased  RV systolic function.  CPX in 4/19 with severe functional limitation due to HF.  RHC (6/19) showed low output and coronary angiography showed no interventional targets. Patient was recently admitted with low output HF requiring  milrinone, able to titrate off.  He seems to be doing well today, NYHA class II symptom.  He says that he is still losing weight.  He is mildly volume overloaded on exam.  - Continue torsemide 80 mg bid for now.  BMET today.  - Continue current Entresto and spironolactone.  - He can stay off ivabradine, made him nauseated.  - Can increase Coreg to 6.25 mg bid.  - He decided against LVAD and transplant workup when he was last in the hospital. We discussed advanced therapies again today. He understands that he probably has a narrow window at this point. He has agreed to close followup and does not rule out LVAD if he decompensates again. I do fear that we may miss his window for advanced therapies based on his renal and RV function and told him this again 2. CAD s/p CABG: Last cath in 6/19 with patent grafts.  No chest pain.  - continue ASA, statin  3. HTN: BP not elevated.   4. CKD: Stage 3.  Follow closely with diuresis and med adjustment, most recently 1.4.   5. Lower extremity ulcerations: Suspect venous stasis ulcers.  The ulcer on the right lower leg is healing now that edema is improved.  I am very concerned about infection on the left lower leg ulcer.   - start Cipro 500 mg bid x 2 wks (cover Pseudomonas). - He needs appt ASAP with the wound clinic, will arrange.   Close followup in 3 wks.   Alyda Megna,MD 01/21/2018

## 2018-01-27 ENCOUNTER — Encounter (HOSPITAL_COMMUNITY): Payer: BLUE CROSS/BLUE SHIELD

## 2018-01-30 ENCOUNTER — Encounter (HOSPITAL_BASED_OUTPATIENT_CLINIC_OR_DEPARTMENT_OTHER): Payer: BLUE CROSS/BLUE SHIELD | Attending: Internal Medicine

## 2018-01-30 DIAGNOSIS — Z95810 Presence of automatic (implantable) cardiac defibrillator: Secondary | ICD-10-CM | POA: Insufficient documentation

## 2018-01-30 DIAGNOSIS — L97222 Non-pressure chronic ulcer of left calf with fat layer exposed: Secondary | ICD-10-CM | POA: Diagnosis not present

## 2018-01-30 DIAGNOSIS — E119 Type 2 diabetes mellitus without complications: Secondary | ICD-10-CM | POA: Diagnosis not present

## 2018-01-30 DIAGNOSIS — T887XXA Unspecified adverse effect of drug or medicament, initial encounter: Secondary | ICD-10-CM | POA: Diagnosis not present

## 2018-01-30 DIAGNOSIS — E1165 Type 2 diabetes mellitus with hyperglycemia: Secondary | ICD-10-CM | POA: Insufficient documentation

## 2018-01-30 DIAGNOSIS — I2510 Atherosclerotic heart disease of native coronary artery without angina pectoris: Secondary | ICD-10-CM | POA: Insufficient documentation

## 2018-01-30 DIAGNOSIS — L97812 Non-pressure chronic ulcer of other part of right lower leg with fat layer exposed: Secondary | ICD-10-CM | POA: Diagnosis not present

## 2018-01-30 DIAGNOSIS — L97211 Non-pressure chronic ulcer of right calf limited to breakdown of skin: Secondary | ICD-10-CM | POA: Insufficient documentation

## 2018-01-30 DIAGNOSIS — L97221 Non-pressure chronic ulcer of left calf limited to breakdown of skin: Secondary | ICD-10-CM | POA: Diagnosis not present

## 2018-01-30 DIAGNOSIS — Z794 Long term (current) use of insulin: Secondary | ICD-10-CM | POA: Insufficient documentation

## 2018-01-30 DIAGNOSIS — I87333 Chronic venous hypertension (idiopathic) with ulcer and inflammation of bilateral lower extremity: Secondary | ICD-10-CM | POA: Insufficient documentation

## 2018-01-30 DIAGNOSIS — E1140 Type 2 diabetes mellitus with diabetic neuropathy, unspecified: Secondary | ICD-10-CM | POA: Diagnosis not present

## 2018-01-30 DIAGNOSIS — I5042 Chronic combined systolic (congestive) and diastolic (congestive) heart failure: Secondary | ICD-10-CM | POA: Insufficient documentation

## 2018-01-30 DIAGNOSIS — I110 Hypertensive heart disease with heart failure: Secondary | ICD-10-CM | POA: Diagnosis not present

## 2018-02-07 ENCOUNTER — Encounter (HOSPITAL_BASED_OUTPATIENT_CLINIC_OR_DEPARTMENT_OTHER): Payer: BLUE CROSS/BLUE SHIELD | Attending: Internal Medicine

## 2018-02-07 DIAGNOSIS — I2510 Atherosclerotic heart disease of native coronary artery without angina pectoris: Secondary | ICD-10-CM | POA: Diagnosis not present

## 2018-02-07 DIAGNOSIS — Z951 Presence of aortocoronary bypass graft: Secondary | ICD-10-CM | POA: Insufficient documentation

## 2018-02-07 DIAGNOSIS — I87332 Chronic venous hypertension (idiopathic) with ulcer and inflammation of left lower extremity: Secondary | ICD-10-CM | POA: Diagnosis not present

## 2018-02-07 DIAGNOSIS — I5042 Chronic combined systolic (congestive) and diastolic (congestive) heart failure: Secondary | ICD-10-CM | POA: Insufficient documentation

## 2018-02-07 DIAGNOSIS — I110 Hypertensive heart disease with heart failure: Secondary | ICD-10-CM | POA: Insufficient documentation

## 2018-02-07 DIAGNOSIS — L97822 Non-pressure chronic ulcer of other part of left lower leg with fat layer exposed: Secondary | ICD-10-CM | POA: Diagnosis not present

## 2018-02-07 DIAGNOSIS — E1140 Type 2 diabetes mellitus with diabetic neuropathy, unspecified: Secondary | ICD-10-CM | POA: Diagnosis not present

## 2018-02-07 DIAGNOSIS — E11622 Type 2 diabetes mellitus with other skin ulcer: Secondary | ICD-10-CM | POA: Insufficient documentation

## 2018-02-07 DIAGNOSIS — L97222 Non-pressure chronic ulcer of left calf with fat layer exposed: Secondary | ICD-10-CM | POA: Diagnosis not present

## 2018-02-08 ENCOUNTER — Other Ambulatory Visit: Payer: Self-pay | Admitting: Family Medicine

## 2018-02-10 ENCOUNTER — Other Ambulatory Visit (HOSPITAL_COMMUNITY): Payer: Self-pay | Admitting: *Deleted

## 2018-02-10 DIAGNOSIS — I5043 Acute on chronic combined systolic (congestive) and diastolic (congestive) heart failure: Secondary | ICD-10-CM

## 2018-02-11 ENCOUNTER — Other Ambulatory Visit (HOSPITAL_COMMUNITY): Payer: Self-pay | Admitting: *Deleted

## 2018-02-11 ENCOUNTER — Ambulatory Visit (HOSPITAL_COMMUNITY)
Admission: RE | Admit: 2018-02-11 | Discharge: 2018-02-11 | Disposition: A | Discharge status: Home / Self Care | Payer: BLUE CROSS/BLUE SHIELD | Source: Ambulatory Visit | Attending: Cardiology | Admitting: Cardiology

## 2018-02-11 ENCOUNTER — Encounter (HOSPITAL_COMMUNITY): Payer: Self-pay

## 2018-02-11 VITALS — BP 127/76 | HR 97 | Wt 202.8 lb

## 2018-02-11 DIAGNOSIS — I5043 Acute on chronic combined systolic (congestive) and diastolic (congestive) heart failure: Secondary | ICD-10-CM | POA: Diagnosis not present

## 2018-02-11 DIAGNOSIS — Z7982 Long term (current) use of aspirin: Secondary | ICD-10-CM | POA: Diagnosis not present

## 2018-02-11 DIAGNOSIS — I2510 Atherosclerotic heart disease of native coronary artery without angina pectoris: Secondary | ICD-10-CM | POA: Diagnosis not present

## 2018-02-11 DIAGNOSIS — E11621 Type 2 diabetes mellitus with foot ulcer: Secondary | ICD-10-CM | POA: Diagnosis not present

## 2018-02-11 DIAGNOSIS — I255 Ischemic cardiomyopathy: Secondary | ICD-10-CM | POA: Insufficient documentation

## 2018-02-11 DIAGNOSIS — E1122 Type 2 diabetes mellitus with diabetic chronic kidney disease: Secondary | ICD-10-CM | POA: Insufficient documentation

## 2018-02-11 DIAGNOSIS — E1140 Type 2 diabetes mellitus with diabetic neuropathy, unspecified: Secondary | ICD-10-CM | POA: Diagnosis not present

## 2018-02-11 DIAGNOSIS — Z794 Long term (current) use of insulin: Secondary | ICD-10-CM | POA: Insufficient documentation

## 2018-02-11 DIAGNOSIS — I5042 Chronic combined systolic (congestive) and diastolic (congestive) heart failure: Secondary | ICD-10-CM | POA: Diagnosis not present

## 2018-02-11 DIAGNOSIS — M109 Gout, unspecified: Secondary | ICD-10-CM | POA: Insufficient documentation

## 2018-02-11 DIAGNOSIS — N183 Chronic kidney disease, stage 3 (moderate): Secondary | ICD-10-CM | POA: Insufficient documentation

## 2018-02-11 DIAGNOSIS — Z95810 Presence of automatic (implantable) cardiac defibrillator: Secondary | ICD-10-CM | POA: Insufficient documentation

## 2018-02-11 DIAGNOSIS — I5022 Chronic systolic (congestive) heart failure: Secondary | ICD-10-CM | POA: Insufficient documentation

## 2018-02-11 DIAGNOSIS — Z951 Presence of aortocoronary bypass graft: Secondary | ICD-10-CM | POA: Diagnosis not present

## 2018-02-11 DIAGNOSIS — I130 Hypertensive heart and chronic kidney disease with heart failure and stage 1 through stage 4 chronic kidney disease, or unspecified chronic kidney disease: Secondary | ICD-10-CM | POA: Insufficient documentation

## 2018-02-11 DIAGNOSIS — Z79899 Other long term (current) drug therapy: Secondary | ICD-10-CM | POA: Insufficient documentation

## 2018-02-11 LAB — BASIC METABOLIC PANEL
Anion gap: 11 (ref 5–15)
BUN: 39 mg/dL — ABNORMAL HIGH (ref 6–20)
CHLORIDE: 102 mmol/L (ref 98–111)
CO2: 27 mmol/L (ref 22–32)
CREATININE: 1.26 mg/dL — AB (ref 0.61–1.24)
Calcium: 9.4 mg/dL (ref 8.9–10.3)
GFR calc non Af Amer: >60 mL/min (ref >60)
Glucose, Bld: 185 mg/dL — ABNORMAL HIGH (ref 70–99)
POTASSIUM: 4.3 mmol/L (ref 3.5–5.1)
SODIUM: 140 mmol/L (ref 135–145)

## 2018-02-11 LAB — DIGOXIN LEVEL

## 2018-02-11 MED ORDER — TORSEMIDE 20 MG PO TABS: 100.0000 mg | ORAL_TABLET | Freq: Two times a day (BID) | ORAL | 6 refills | Status: DC

## 2018-02-11 NOTE — Progress Notes (Signed)
Patient ID: Marcus Johnson, male   DOB: 07/31/1961, 57 y.o.   MRN: [id number] PCP: Dr. Sarajane Jews Cardiology: Dr. Aundra Dubin  HPI:  Marcus Johnson is a 57 y.o. male with h/o DM2, CAD s/p CABG 3614, systolic HF due to ischemic cardiomyopathy with EF 20-25% (echo 12/15), DM2 and CKD. He is s/p Medtronic single chamber ICD.  Admitted in 12/15 due to ADHF. Required short course milrinone for diuresis. Diuresed 30 pounds.   CPX 2/16 showed severely reduced functional capacity.  There was a significant disconnect between his symptoms and the CPX.  RHC in 6/16 showed fairly normal filling pressures and low but not markedly low cardiac output.   CPX (4/19) was submaximal but suggested severe limitation from HF.   He was admitted in 6/19 with marked volume overload and dyspnea.  Echo in 6/19 showed EF 20% with severe global hypokinesis.  LHC/RHC showed severe native vessel CAD with patent LIMA-D, SVG-OM, and SVG-PLV; cardiac index low.  He was started on milrinone and diuresed.  We discussed LVAD extensively in light of low output, creatinine that is trending up and concern for decompensation of RV over time. He was adamant that he did not want to pursue LVAD workup yet and did not want a referral for transplant evaluation. Milrinone was weaned off and he was discharged home.   He returns for followup of CHF and CAD. Weight today is up 2 lbs.  He is working full time at [employer] to walk up 3 flights of steps without dyspnea.  No chest pain.  No orthopnea/PND.  He has been going to the wound clinic, the wounds on right are healed and the wound on the left is healing.  He has completed his course of Cipro.    Studies: Echo (12/15): EF 20-25% RV moderately HK CPX (2/16): RER 1.10, peak VO2 8.6, VE/VCO2 slope 43.7 => severely reduced functional capacity.  RHC (6/16): mean RA 8, PA 48/19 mean 30, mean PCWP 14, CI 2.03 Fick/2.28 Thermo, PVR 3.6.  Echo (5/17): EF 25%, mild LV dilation, moderate diastolic dysfunction,  normal RV size with mild to moderate systolic dysfunction, mild mitral regurgitation CPX (4/19): Peak VO2 7, VE/VCO2 slope 41, RER 1.02 => submaximal but still suggestive of severe HF limitation.  Echo (6/19): EF 20%, diffuse severe HK, moderately decreased RV systolic function, moderate TR.  LHC/RHC (6/19): Severe native vessel CAD with patent LIMA-D, SVG-OM, and SVG-PLV; mean RA 15, PA 54/24 mean 37, mean PCWP 32, CI 1.86 Fick/1.64 thermo. PAPi 2, PVR 1.25 WU.   Labs:  8/15 LDL 88 12/15 K+ 4.2 creatinine 1.09  1/16 K 4.3, creatinine 1.34 4/16 K 4.5, creatinine 1.19 3/17 K 3.8, creatinine 1.22, HCT 35.3, BNP 1597 5/17 K 4.1, creatinine 1.25 6/18 LDL 138, K 4.4, creatinine 1.5 6/19 hgb 13.5, K 4.9, creatinine 1.53 => 1.5 => 1.4, BNP 992  ROS: All systems negative except as listed in HPI, PMH and Problem List.  SH:  Social History   Socioeconomic History  . Marital status: Divorced    Spouse name: Not on file  . Number of children: Not on file  . Years of education: Not on file  . Highest education level: Not on file  Occupational History  . Not on file  Social Needs  . Financial resource strain: Not on file  . Food insecurity:    Worry: Not on file    Inability: Not on file  . Transportation needs:    Medical: Not  on file    Non-medical: Not on file  Tobacco Use  . Smoking status: Never Smoker  . Smokeless tobacco: Never Used  Substance and Sexual Activity  . Alcohol use: No    Alcohol/week: 0.0 oz  . Drug use: No  . Sexual activity: Not on file  Lifestyle  . Physical activity:    Days per week: Not on file    Minutes per session: Not on file  . Stress: Not on file  Relationships  . Social connections:    Talks on phone: Not on file    Gets together: Not on file    Attends religious service: Not on file    Active member of club or organization: Not on file    Attends meetings of clubs or organizations: Not on file    Relationship status: Not on file  .  Intimate partner violence:    Fear of current or ex partner: Not on file    Emotionally abused: Not on file    Physically abused: Not on file    Forced sexual activity: Not on file  Other Topics Concern  . Not on file  Social History Narrative   Lives in [place] with his son and his family.  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    FH:  Family History  Problem Relation Age of Onset  . Diabetes Father        died in his 58's  . Hypertension Mother        died in her 79's - had a ppm.    Past Medical History:  Diagnosis Date  . AICD (automatic cardioverter/defibrillator) present    Single-chamber  implantable cardiac defibrillator - Medtronic  . CHF (congestive heart failure) ([place])   . Chronic systolic heart failure ([place])    a. Echo 5/13: Mild LVE, mild LVH, EF 10%, anteroseptal, lateral, apical AK, mild MR, mild LAE, moderately reduced RVSF, mild RAE, PASP 60;  b. 07/2014 Echo: EF 20-2%, diff HK, AKI of antsep/apical/mid-apicalinferior, mod reduced RV.  Marland Kitchen Coronary artery disease    a. s/p CABG 2002 b. LHC 5/13:  dLM 80%, LAD subtotally occluded, pCFX occluded, pRCA 50%, mid? Occlusion with high take off of the PDA with 70% multiple lesions-not bypassed and supplies collaterals to LAD, LIMA-IM/ramus ok, S-OM ok, S-PLA branches ok. Medical therapy was recommended  . Gout    "on daily RX" (01/08/2018)  . Hypertension   . Ischemic cardiomyopathy    a. Echo 5/13: Mild LVE, mild LVH, EF 10%, anteroseptal, lateral, apical AK, mild MR, mild LAE, moderately reduced RVSF, mild RAE, PASP 60;  b. 01/2012 s/p MDT [id number] Protecta XT VR AICD;  c. 07/2014 Echo: EF 20-2%, diff HK, AKI of antsep/apical/mid-apicalinferior, mod reduced RV.  Marland Kitchen MRSA (methicillin resistant Staphylococcus aureus)    Status post right foot plantar deep infection with MRSA status post  I&D 10/2008  . Myocardial infarction [facility])    "was told I'd had several before heart OR in 2002" (01/08/2018)  .  Noncompliance   . Peripheral neuropathy   . Syncope   . Type II diabetes mellitus ([place])    requiring insulin     Current Outpatient Medications  Medication Sig Dispense Refill  . allopurinol (ZYLOPRIM) 300 MG tablet Take 2 tablets (600 mg total) by mouth daily. 180 tablet 3  . aspirin EC 81 MG EC tablet Take 1 tablet (81 mg total) by mouth daily. 30 tablet 3  .  BREEZE 2 TEST DISK USE TO TEST BLOOD SUGAR 3 TIMES DAILY. 100 each 3  . carvedilol (COREG) 3.125 MG tablet Take 2 tablets (6.25 mg total) by mouth 2 (two) times daily with a meal. (Patient taking differently: Take 3.125 mg by mouth 2 (two) times daily with a meal. ) 60 tablet 6  . digoxin (LANOXIN) 0.125 MG tablet Take 1 tablet (0.125 mg total) by mouth daily. 30 tablet 3  . insulin aspart (NOVOLOG FLEXPEN) 100 UNIT/ML FlexPen Inject 5 units into the skin 3 times daily with meals.  Dx code: E11.9 15 mL 11  . Insulin Glargine (LANTUS SOLOSTAR) 100 UNIT/ML Solostar Pen INJECT 60 UNITS INTO THE SKIN DAILY AT 10 PM    Dx code:E11.9 15 mL 11  . potassium chloride SA (K-DUR,KLOR-CON) 20 MEQ tablet Take 1 tablet (20 mEq total) by mouth daily. (Patient taking differently: Take 20 mEq by mouth 2 (two) times daily. ) 90 tablet 3  . rosuvastatin (CRESTOR) 40 MG tablet Take 1 tablet (40 mg total) by mouth daily. 30 tablet 11  . sacubitril-valsartan (ENTRESTO) 49-51 MG Take 1 tablet by mouth 2 (two) times daily. 60 tablet 6  . spironolactone (ALDACTONE) 25 MG tablet Take 1 tablet (25 mg total) by mouth daily. 30 tablet 11  . torsemide (DEMADEX) 20 MG tablet Take 5 tablets (100 mg total) by mouth 2 (two) times daily. 240 tablet 6  . ondansetron (ZOFRAN) 8 MG tablet Take 1 tablet (8 mg total) every 8 (eight) hours as needed by mouth for nausea or vomiting. (Patient not taking: Reported on 02/11/2018) 30 tablet 0  . traMADol (ULTRAM) 50 MG tablet Take 2 tablets (100 mg total) by mouth every 6 (six) hours as needed for moderate pain. (Patient not taking:  Reported on 02/11/2018) 60 tablet 1   No current facility-administered medications for this encounter.     Vitals:   02/11/18 1032 02/11/18 1033  BP: (!) 142/80 127/76  Pulse:  97  SpO2:  100%  Weight:  202 lb 12.8 oz (92 kg)    PHYSICAL EXAM: General: NAD Neck: JVP 10 cm, no thyromegaly or thyroid nodule.  Lungs: Clear to auscultation bilaterally with normal respiratory effort. CV: Nondisplaced PMI.  Heart regular S1/S2, no S3/S4, 3/6 HSM LLSB/apex.  1+ ankle edema bilaterally.  No carotid bruit.  Normal pedal pulses.  Abdomen: Soft, nontender, no hepatosplenomegaly, no distention.  Skin: Left lower leg ulceration, wrapped..  Neurologic: Alert and oriented x 3.  Psych: Normal affect. Extremities: No clubbing or cyanosis.  HEENT: Normal.   ASSESSMENT & PLAN: 1. Chronic systolic HF:  Ischemic CMP, EF 20-25% (Echo 12/15). Repeat echo 5/17 with EF 25%.  Admit in 12/15, required short-term milrinone for low output.  Medtronic ICD, narrow QRS so not CRT candidate. Markedly abnormal CPX in 2/16 with poor functional capacity due to circulatory limitation, maximal effort. RHC in 6/16 showed near-normal filling pressures and low but not markedly low cardiac output. Most recent echo in 6/19 with EF 20% and moderately decreased RV systolic function.  CPX in 4/19 with severe functional limitation due to HF.  RHC (6/19) showed low output and coronary angiography showed no interventional targets. Patient was recently admitted with low output HF requiring milrinone, able to titrate off.  Minimally symptomatic, NYHA class II, but he is volume overloaded on exam.   - Increase torsemide to 100 mg bid.  BMET today and again in 10 days.   - Continue current Entresto and spironolactone.  -  He can stay off ivabradine, made him nauseated.  - Increase Coreg to 6.25 mg bid (did not do this after last appt, confused about instructions).  - Continue digoxin, check level.   - He decided against LVAD and transplant  workup when he was last in the hospital. We discussed advanced therapies again today. He understands that he probably has a narrow window at this point. He has agreed to close followup and does not rule out LVAD if he decompensates again. I do fear that we may miss his window for advanced therapies based on his renal and RV function and told him this again. He remains volume overloaded. 2. CAD s/p CABG: Last cath in 6/19 with patent grafts.  No chest pain.  - continue ASA, statin  3. HTN: BP not elevated.   4. CKD: Stage 3.  Follow closely with diuresis and med adjustment, most recently 1.4.  BMET today.  5. Lower extremity ulcerations: Suspect venous stasis ulcers.  The ulcer on the right lower leg is healed and left lower leg is healing.  - Continue followup at wound clinic.   Close followup in 3 wks.   Ronald Londo,MD 02/11/2018

## 2018-02-11 NOTE — Progress Notes (Addendum)
Patient presents for 3 week follow up  in [facility] today.   BP elevated, but pt reports he has not taken his am meds. He confirms he did re-start Digoxin as instructed last visit, but DID NOT increase his Coreg to 6.25 mg twice daily. He is still taking 3.125 mg bid.   Pt states he continues to feel better and is working full time. He continues to climb the 3 flights of stairs to his apartment without stopping. Reports biggest hindrance to activity is heat.   He has been seen in wound clinic weekly since referral. Right leg healed, bandage free. Left leg still has bandage intact but pt reports it is improving. He reports he completed 14 day course of Cipro for wound infection.   Optivol obtained for Dr. Claris Gladden review.   Vital Signs:  Automatc BP: 142/80 (104); 127/76 (95) HR:  97  Weight: 202.8 lb w/o eqt Last weight: 200.2 lb  Device: Medtronic single Therapies: On 200 bpm Monitor on: 162 bpm  Plan: 1. Increase Torsemide (water pill) to 100 mg twice daily. Take 5 pills twice daily. 2. Increase corvedilol (Coreg) to 6.25 mg twice daily. Take 2 pills twice daily.  3. Remember to go by your med sheet, not your pillbox. 4. Repeat labs in 10 days. 5. Return to Heart Failure clinic in 3 weeks (may be seen in VAD clinic if space unavailable in CHF clinic).  Orlando Penner RN, BSN VAD Coordinator   Office: [phone] 24/7 Emergency VAD Pager: [phone]

## 2018-02-11 NOTE — Patient Instructions (Addendum)
1. Increase Torsemide (water pill) to 100 mg twice daily. Take 5 pills twice daily. 2. Increase corvedilol (Coreg) to 6.25 mg twice daily. Take 2 pills twice daily.  3. Remember to go by your med sheet, not your pillbox. 4. Repeat labs in 10 days. 5. Return to Heart Failure clinic in 3 weeks.

## 2018-02-13 DIAGNOSIS — L97822 Non-pressure chronic ulcer of other part of left lower leg with fat layer exposed: Secondary | ICD-10-CM | POA: Diagnosis not present

## 2018-02-13 DIAGNOSIS — I2510 Atherosclerotic heart disease of native coronary artery without angina pectoris: Secondary | ICD-10-CM | POA: Diagnosis not present

## 2018-02-13 DIAGNOSIS — I110 Hypertensive heart disease with heart failure: Secondary | ICD-10-CM | POA: Diagnosis not present

## 2018-02-13 DIAGNOSIS — L97222 Non-pressure chronic ulcer of left calf with fat layer exposed: Secondary | ICD-10-CM | POA: Diagnosis not present

## 2018-02-13 DIAGNOSIS — Z951 Presence of aortocoronary bypass graft: Secondary | ICD-10-CM | POA: Diagnosis not present

## 2018-02-13 DIAGNOSIS — E11622 Type 2 diabetes mellitus with other skin ulcer: Secondary | ICD-10-CM | POA: Diagnosis not present

## 2018-02-13 DIAGNOSIS — E1140 Type 2 diabetes mellitus with diabetic neuropathy, unspecified: Secondary | ICD-10-CM | POA: Diagnosis not present

## 2018-02-13 DIAGNOSIS — I87332 Chronic venous hypertension (idiopathic) with ulcer and inflammation of left lower extremity: Secondary | ICD-10-CM | POA: Diagnosis not present

## 2018-02-13 DIAGNOSIS — I5042 Chronic combined systolic (congestive) and diastolic (congestive) heart failure: Secondary | ICD-10-CM | POA: Diagnosis not present

## 2018-02-19 ENCOUNTER — Other Ambulatory Visit (HOSPITAL_COMMUNITY): Payer: Self-pay | Admitting: *Deleted

## 2018-02-19 DIAGNOSIS — I5043 Acute on chronic combined systolic (congestive) and diastolic (congestive) heart failure: Secondary | ICD-10-CM

## 2018-02-19 DIAGNOSIS — Z5189 Encounter for other specified aftercare: Secondary | ICD-10-CM

## 2018-02-20 DIAGNOSIS — Z951 Presence of aortocoronary bypass graft: Secondary | ICD-10-CM | POA: Diagnosis not present

## 2018-02-20 DIAGNOSIS — L97222 Non-pressure chronic ulcer of left calf with fat layer exposed: Secondary | ICD-10-CM | POA: Diagnosis not present

## 2018-02-20 DIAGNOSIS — I5042 Chronic combined systolic (congestive) and diastolic (congestive) heart failure: Secondary | ICD-10-CM | POA: Diagnosis not present

## 2018-02-20 DIAGNOSIS — E11622 Type 2 diabetes mellitus with other skin ulcer: Secondary | ICD-10-CM | POA: Diagnosis not present

## 2018-02-20 DIAGNOSIS — L97822 Non-pressure chronic ulcer of other part of left lower leg with fat layer exposed: Secondary | ICD-10-CM | POA: Diagnosis not present

## 2018-02-20 DIAGNOSIS — E1140 Type 2 diabetes mellitus with diabetic neuropathy, unspecified: Secondary | ICD-10-CM | POA: Diagnosis not present

## 2018-02-20 DIAGNOSIS — I87332 Chronic venous hypertension (idiopathic) with ulcer and inflammation of left lower extremity: Secondary | ICD-10-CM | POA: Diagnosis not present

## 2018-02-20 DIAGNOSIS — I2510 Atherosclerotic heart disease of native coronary artery without angina pectoris: Secondary | ICD-10-CM | POA: Diagnosis not present

## 2018-02-20 DIAGNOSIS — I87312 Chronic venous hypertension (idiopathic) with ulcer of left lower extremity: Secondary | ICD-10-CM | POA: Diagnosis not present

## 2018-02-20 DIAGNOSIS — I110 Hypertensive heart disease with heart failure: Secondary | ICD-10-CM | POA: Diagnosis not present

## 2018-02-21 ENCOUNTER — Ambulatory Visit (HOSPITAL_COMMUNITY)
Admission: RE | Admit: 2018-02-21 | Discharge: 2018-02-21 | Disposition: A | Discharge status: Home / Self Care | Payer: BLUE CROSS/BLUE SHIELD | Source: Ambulatory Visit | Attending: Cardiology | Admitting: Cardiology

## 2018-02-21 DIAGNOSIS — Z5189 Encounter for other specified aftercare: Secondary | ICD-10-CM | POA: Diagnosis not present

## 2018-02-21 DIAGNOSIS — I5043 Acute on chronic combined systolic (congestive) and diastolic (congestive) heart failure: Secondary | ICD-10-CM | POA: Insufficient documentation

## 2018-02-21 LAB — BASIC METABOLIC PANEL
Anion gap: 11 (ref 5–15)
BUN: 39 mg/dL — AB (ref 6–20)
CHLORIDE: 100 mmol/L (ref 98–111)
CO2: 30 mmol/L (ref 22–32)
Calcium: 9.5 mg/dL (ref 8.9–10.3)
Creatinine, Ser: 1.35 mg/dL — ABNORMAL HIGH (ref 0.61–1.24)
GFR calc Af Amer: >60 mL/min (ref >60)
GFR calc non Af Amer: 57 mL/min — ABNORMAL LOW (ref >60)
GLUCOSE: 166 mg/dL — AB (ref 70–99)
POTASSIUM: 4.5 mmol/L (ref 3.5–5.1)
Sodium: 141 mmol/L (ref 135–145)

## 2018-02-27 DIAGNOSIS — E11622 Type 2 diabetes mellitus with other skin ulcer: Secondary | ICD-10-CM | POA: Diagnosis not present

## 2018-02-27 DIAGNOSIS — L97222 Non-pressure chronic ulcer of left calf with fat layer exposed: Secondary | ICD-10-CM | POA: Diagnosis not present

## 2018-02-27 DIAGNOSIS — Z951 Presence of aortocoronary bypass graft: Secondary | ICD-10-CM | POA: Diagnosis not present

## 2018-02-27 DIAGNOSIS — I5042 Chronic combined systolic (congestive) and diastolic (congestive) heart failure: Secondary | ICD-10-CM | POA: Diagnosis not present

## 2018-02-27 DIAGNOSIS — I87332 Chronic venous hypertension (idiopathic) with ulcer and inflammation of left lower extremity: Secondary | ICD-10-CM | POA: Diagnosis not present

## 2018-02-27 DIAGNOSIS — I87312 Chronic venous hypertension (idiopathic) with ulcer of left lower extremity: Secondary | ICD-10-CM | POA: Diagnosis not present

## 2018-02-27 DIAGNOSIS — L97822 Non-pressure chronic ulcer of other part of left lower leg with fat layer exposed: Secondary | ICD-10-CM | POA: Diagnosis not present

## 2018-02-27 DIAGNOSIS — I110 Hypertensive heart disease with heart failure: Secondary | ICD-10-CM | POA: Diagnosis not present

## 2018-02-27 DIAGNOSIS — E1140 Type 2 diabetes mellitus with diabetic neuropathy, unspecified: Secondary | ICD-10-CM | POA: Diagnosis not present

## 2018-02-27 DIAGNOSIS — I2510 Atherosclerotic heart disease of native coronary artery without angina pectoris: Secondary | ICD-10-CM | POA: Diagnosis not present

## 2018-03-04 ENCOUNTER — Ambulatory Visit (HOSPITAL_COMMUNITY)
Admission: RE | Admit: 2018-03-04 | Discharge: 2018-03-04 | Disposition: A | Discharge status: Home / Self Care | Payer: BLUE CROSS/BLUE SHIELD | Source: Ambulatory Visit | Attending: Cardiology | Admitting: Cardiology

## 2018-03-04 VITALS — BP 120/76 | HR 85 | Wt 195.6 lb

## 2018-03-04 DIAGNOSIS — I5022 Chronic systolic (congestive) heart failure: Secondary | ICD-10-CM | POA: Insufficient documentation

## 2018-03-04 DIAGNOSIS — L97909 Non-pressure chronic ulcer of unspecified part of unspecified lower leg with unspecified severity: Secondary | ICD-10-CM | POA: Diagnosis not present

## 2018-03-04 DIAGNOSIS — Z95810 Presence of automatic (implantable) cardiac defibrillator: Secondary | ICD-10-CM | POA: Insufficient documentation

## 2018-03-04 DIAGNOSIS — E1122 Type 2 diabetes mellitus with diabetic chronic kidney disease: Secondary | ICD-10-CM | POA: Diagnosis not present

## 2018-03-04 DIAGNOSIS — I255 Ischemic cardiomyopathy: Secondary | ICD-10-CM | POA: Diagnosis not present

## 2018-03-04 DIAGNOSIS — I5042 Chronic combined systolic (congestive) and diastolic (congestive) heart failure: Secondary | ICD-10-CM | POA: Diagnosis not present

## 2018-03-04 DIAGNOSIS — R600 Localized edema: Secondary | ICD-10-CM

## 2018-03-04 DIAGNOSIS — I252 Old myocardial infarction: Secondary | ICD-10-CM | POA: Insufficient documentation

## 2018-03-04 DIAGNOSIS — E1140 Type 2 diabetes mellitus with diabetic neuropathy, unspecified: Secondary | ICD-10-CM | POA: Insufficient documentation

## 2018-03-04 DIAGNOSIS — I130 Hypertensive heart and chronic kidney disease with heart failure and stage 1 through stage 4 chronic kidney disease, or unspecified chronic kidney disease: Secondary | ICD-10-CM | POA: Diagnosis not present

## 2018-03-04 DIAGNOSIS — N183 Chronic kidney disease, stage 3 unspecified: Secondary | ICD-10-CM

## 2018-03-04 DIAGNOSIS — Z794 Long term (current) use of insulin: Secondary | ICD-10-CM | POA: Insufficient documentation

## 2018-03-04 DIAGNOSIS — M109 Gout, unspecified: Secondary | ICD-10-CM | POA: Insufficient documentation

## 2018-03-04 DIAGNOSIS — Z951 Presence of aortocoronary bypass graft: Secondary | ICD-10-CM | POA: Diagnosis not present

## 2018-03-04 DIAGNOSIS — Z79899 Other long term (current) drug therapy: Secondary | ICD-10-CM | POA: Insufficient documentation

## 2018-03-04 DIAGNOSIS — I10 Essential (primary) hypertension: Secondary | ICD-10-CM

## 2018-03-04 DIAGNOSIS — I2510 Atherosclerotic heart disease of native coronary artery without angina pectoris: Secondary | ICD-10-CM | POA: Diagnosis not present

## 2018-03-04 DIAGNOSIS — Z7982 Long term (current) use of aspirin: Secondary | ICD-10-CM | POA: Diagnosis not present

## 2018-03-04 LAB — BASIC METABOLIC PANEL WITH GFR
Anion gap: 13 (ref 5–15)
BUN: 38 mg/dL — ABNORMAL HIGH (ref 6–20)
CO2: 31 mmol/L (ref 22–32)
Calcium: 9.9 mg/dL (ref 8.9–10.3)
Chloride: 99 mmol/L (ref 98–111)
Creatinine, Ser: 1.37 mg/dL — ABNORMAL HIGH (ref 0.61–1.24)
GFR calc Af Amer: >60 mL/min
GFR calc non Af Amer: 56 mL/min — ABNORMAL LOW
Glucose, Bld: 105 mg/dL — ABNORMAL HIGH (ref 70–99)
Potassium: 4.4 mmol/L (ref 3.5–5.1)
Sodium: 143 mmol/L (ref 135–145)

## 2018-03-04 MED ORDER — CARVEDILOL 6.25 MG PO TABS: 6.2500 mg | ORAL_TABLET | Freq: Two times a day (BID) | ORAL | 11 refills | Status: DC

## 2018-03-04 MED ORDER — CARVEDILOL 3.125 MG PO TABS: 3.1250 mg | ORAL_TABLET | Freq: Two times a day (BID) | ORAL | 6 refills | Status: DC

## 2018-03-04 NOTE — Progress Notes (Signed)
Patient ID: Marcus Johnson, male   DOB: [birth year], 57 y.o.   MRN: [id number] PCP: Dr. Sarajane Jews Cardiology: Dr. Aundra Dubin  HPI: [name] is a 57 y.o. male with h/o DM2, CAD s/p CABG 1856, systolic HF due to ischemic cardiomyopathy with EF 20-25% (echo 12/15), DM2 and CKD. He is s/p Medtronic single chamber ICD.  Admitted in 12/15 due to ADHF. Required short course milrinone for diuresis. Diuresed 30 pounds.   CPX 2/16 showed severely reduced functional capacity.  There was a significant disconnect between his symptoms and the CPX.  RHC in 6/16 showed fairly normal filling pressures and low but not markedly low cardiac output.   CPX (4/19) was submaximal but suggested severe limitation from HF.   He was admitted in 6/19 with marked volume overload and dyspnea.  Echo in 6/19 showed EF 20% with severe global hypokinesis.  LHC/RHC showed severe native vessel CAD with patent LIMA-D, SVG-OM, and SVG-PLV; cardiac index low.  He was started on milrinone and diuresed.  We discussed LVAD extensively in light of low output, creatinine that is trending up and concern for decompensation of RV over time. He was adamant that he did not want to pursue LVAD workup yet and did not want a referral for transplant evaluation. Milrinone was weaned off and he was discharged home.   Today he returns for HF follow up. Last visit torsemide was increased to 100 mg twice a day and coreg was increased to 6.25 mg twice a day. Overall feeling fine. Denies SOB/PND/Orthopnea. Denies nausea. Appetite ok. No fever or chills. Weight at home  194-195 pounds. Continue to be followed at the [facility]. LLE wound has been improving. Taking all medications. Working full time without difficulty.     Studies: Echo (12/15): EF 20-25% RV moderately HK CPX (2/16): RER 1.10, peak VO2 8.6, VE/VCO2 slope 43.7 => severely reduced functional capacity.  RHC (6/16): mean RA 8, PA 48/19 mean 30, mean PCWP 14, CI 2.03 Fick/2.28 Thermo, PVR 3.6.  Echo  (5/17): EF 25%, mild LV dilation, moderate diastolic dysfunction, normal RV size with mild to moderate systolic dysfunction, mild mitral regurgitation CPX (4/19): Peak VO2 7, VE/VCO2 slope 41, RER 1.02 => submaximal but still suggestive of severe HF limitation.  Echo (6/19): EF 20%, diffuse severe HK, moderately decreased RV systolic function, moderate TR.  LHC/RHC (6/19): Severe native vessel CAD with patent LIMA-D, SVG-OM, and SVG-PLV; mean RA 15, PA 54/24 mean 37, mean PCWP 32, CI 1.86 Fick/1.64 thermo. PAPi 2, PVR 1.25 WU.   Labs:  8/15 LDL 88 12/15 K+ 4.2 creatinine 1.09  1/16 K 4.3, creatinine 1.34 4/16 K 4.5, creatinine 1.19 3/17 K 3.8, creatinine 1.22, HCT 35.3, BNP 1597 5/17 K 4.1, creatinine 1.25 6/18 LDL 138, K 4.4, creatinine 1.5 6/19 hgb 13.5, K 4.9, creatinine 1.53 => 1.5 => 1.4, BNP 992  ROS: All systems negative except as listed in HPI, PMH and Problem List.  SH:  Social History   Socioeconomic History  . Marital status: Divorced    Spouse name: Not on file  . Number of children: Not on file  . Years of education: Not on file  . Highest education level: Not on file  Occupational History  . Not on file  Social Needs  . Financial resource strain: Not on file  . Food insecurity:    Worry: Not on file    Inability: Not on file  . Transportation needs:    Medical: Not on file  Non-medical: Not on file  Tobacco Use  . Smoking status: Never Smoker  . Smokeless tobacco: Never Used  Substance and Sexual Activity  . Alcohol use: No    Alcohol/week: 0.0 oz  . Drug use: No  . Sexual activity: Not on file  Lifestyle  . Physical activity:    Days per week: Not on file    Minutes per session: Not on file  . Stress: Not on file  Relationships  . Social connections:    Talks on phone: Not on file    Gets together: Not on file    Attends religious service: Not on file    Active member of club or organization: Not on file    Attends meetings of clubs or  organizations: Not on file    Relationship status: Not on file  . Intimate partner violence:    Fear of current or ex partner: Not on file    Emotionally abused: Not on file    Physically abused: Not on file    Forced sexual activity: Not on file  Other Topics Concern  . Not on file  Social History Narrative   Lives in [place] with his son and his family.  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    FH:  Family History  Problem Relation Age of Onset  . Diabetes Father        died in his 63's  . Hypertension Mother        died in her 77's - had a ppm.    Past Medical History:  Diagnosis Date  . AICD (automatic cardioverter/defibrillator) present    Single-chamber  implantable cardiac defibrillator - Medtronic  . CHF (congestive heart failure) ([place])   . Chronic systolic heart failure ([place])    a. Echo 5/13: Mild LVE, mild LVH, EF 10%, anteroseptal, lateral, apical AK, mild MR, mild LAE, moderately reduced RVSF, mild RAE, PASP 60;  b. 07/2014 Echo: EF 20-2%, diff HK, AKI of antsep/apical/mid-apicalinferior, mod reduced RV.  Marland Kitchen Coronary artery disease    a. s/p CABG 2002 b. LHC 5/13:  dLM 80%, LAD subtotally occluded, pCFX occluded, pRCA 50%, mid? Occlusion with high take off of the PDA with 70% multiple lesions-not bypassed and supplies collaterals to LAD, LIMA-IM/ramus ok, S-OM ok, S-PLA branches ok. Medical therapy was recommended  . Gout    "on daily RX" (01/08/2018)  . Hypertension   . Ischemic cardiomyopathy    a. Echo 5/13: Mild LVE, mild LVH, EF 10%, anteroseptal, lateral, apical AK, mild MR, mild LAE, moderately reduced RVSF, mild RAE, PASP 60;  b. 01/2012 s/p MDT [id number] Protecta XT VR AICD;  c. 07/2014 Echo: EF 20-2%, diff HK, AKI of antsep/apical/mid-apicalinferior, mod reduced RV.  Marland Kitchen MRSA (methicillin resistant Staphylococcus aureus)    Status post right foot plantar deep infection with MRSA status post  I&D 10/2008  . Myocardial infarction [facility])      "was told I'd had several before heart OR in 2002" (01/08/2018)  . Noncompliance   . Peripheral neuropathy   . Syncope   . Type II diabetes mellitus ([place])    requiring insulin     Current Outpatient Medications  Medication Sig Dispense Refill  . allopurinol (ZYLOPRIM) 300 MG tablet Take 2 tablets (600 mg total) by mouth daily. 180 tablet 3  . aspirin EC 81 MG EC tablet Take 1 tablet (81 mg total) by mouth daily. 30 tablet 3  . carvedilol (COREG) 3.125 MG  tablet Take 2 tablets (6.25 mg total) by mouth 2 (two) times daily with a meal. (Patient taking differently: Take 3.125 mg by mouth 2 (two) times daily with a meal. ) 60 tablet 6  . digoxin (LANOXIN) 0.125 MG tablet Take 1 tablet (0.125 mg total) by mouth daily. 30 tablet 3  . insulin aspart (NOVOLOG FLEXPEN) 100 UNIT/ML FlexPen Inject 5 units into the skin 3 times daily with meals.  Dx code: E11.9 15 mL 11  . Insulin Glargine (LANTUS SOLOSTAR) 100 UNIT/ML Solostar Pen INJECT 60 UNITS INTO THE SKIN DAILY AT 10 PM    Dx code:E11.9 15 mL 11  . potassium chloride SA (K-DUR,KLOR-CON) 20 MEQ tablet Take 1 tablet (20 mEq total) by mouth daily. (Patient taking differently: Take 20 mEq by mouth 2 (two) times daily. ) 90 tablet 3  . rosuvastatin (CRESTOR) 40 MG tablet Take 1 tablet (40 mg total) by mouth daily. 30 tablet 11  . sacubitril-valsartan (ENTRESTO) 49-51 MG Take 1 tablet by mouth 2 (two) times daily. 60 tablet 6  . spironolactone (ALDACTONE) 25 MG tablet Take 1 tablet (25 mg total) by mouth daily. 30 tablet 11  . torsemide (DEMADEX) 20 MG tablet Take 5 tablets (100 mg total) by mouth 2 (two) times daily. 240 tablet 6   No current facility-administered medications for this encounter.     Vitals:   03/04/18 0841  BP: 120/76  Pulse: 85  SpO2: 99%  Weight: 195 lb 9.6 oz (88.7 kg)   Wt Readings from Last 3 Encounters:  03/04/18 195 lb 9.6 oz (88.7 kg)  02/11/18 202 lb 12.8 oz (92 kg)  01/21/18 200 lb 3.2 oz (90.8 kg)     PHYSICAL  EXAM: General:  Well appearing. No resp difficulty.  HEENT: normal Neck: supple. no JVD. Carotids 2+ bilat; no bruits. No lymphadenopathy or thryomegaly appreciated. Cor: PMI nondisplaced. Regular rate & rhythm. No rubs, gallops or murmurs. Lungs: clear Abdomen: soft, nontender, nondistended. No hepatosplenomegaly. No bruits or masses. Good bowel sounds. Extremities: no cyanosis, clubbing, rash, edema. LLE compression wrap in place  Neuro: alert & orientedx3, cranial nerves grossly intact. moves all 4 extremities w/o difficulty. Affect pleasant bbing or cyanosis.  HEENT: Normal.   ASSESSMENT & PLAN: 1. Chronic systolic HF:  Ischemic CMP, EF 20-25% (Echo 12/15). Repeat echo 5/17 with EF 25%.  Admit in 12/15, required short-term milrinone for low output.  Medtronic ICD, narrow QRS so not CRT candidate. Markedly abnormal CPX in 2/16 with poor functional capacity due to circulatory limitation, maximal effort. RHC in 6/16 showed near-normal filling pressures and low but not markedly low cardiac output. Most recent echo in 6/19 with EF 20% and moderately decreased RV systolic function.  CPX in 4/19 with severe functional limitation due to HF.  RHC (6/19) showed low output and coronary angiography showed no interventional targets. Patient was recently admitted with low output HF requiring milrinone, able to titrate off.  Minimally symptomatic,  -NYHA II. Doing great. Volume status stable.  Continue torsemide to 100 mg bid.   - Continue current Entresto and spironolactone.  - Intolerant ivabradine due to nausea.   -Increase coreg to 9.375 mg twice a day.   - Continue digoxin, dig level < 0.2 .   - He decided against LVAD and transplant workup when he was last in the hospital. Advanced therapies have been discussed and he does not want to pursue. We discussed advanced therapies again today. He understands that he probably has a narrow window  at this point.  -He understands that he needs contact HF clinic  with he is SOB/Fatigued/nausea 2. CAD s/p CABG: Last cath in 6/19 with patent grafts. No chest pain. .  - continue ASA, statin  3. HTN: Stable.    4. CKD: Stage 3.  Creatinine baseline 1.2-1.4   Check BMET today. Follow closely with diuresis and med adjustment, most recently 1.4.  BMET today.  5. Lower extremity ulcerations: Suspect venous stasis ulcers.   Continue to follow at the [facility].   Follow up in 4 weeks with APP. I have set up appointment with Dr Aundra Dubin in October.   Marcus Grinder, NP-C  03/04/2018

## 2018-03-04 NOTE — Patient Instructions (Signed)
INCREASE Carvedilol to 9.375 mg (one 6.25 mg tab AND one 3.125 mg tab) twice a day  Labs today We will only contact you if something comes back abnormal or we need to make some changes. Otherwise no news is good news!  Your physician recommends that you schedule a follow-up appointment in: 4 weeks with Amy Clegg,NP and 05/2018 with Dr Aundra Dubin  Do the following things EVERYDAY: 1) Weigh yourself in the morning before breakfast. Write it down and keep it in a log. 2) Take your medicines as prescribed 3) Eat low salt foods-Limit salt (sodium) to 2000 mg per day.  4) Stay as active as you can everyday 5) Limit all fluids for the day to less than 2 liters

## 2018-03-06 ENCOUNTER — Encounter (HOSPITAL_BASED_OUTPATIENT_CLINIC_OR_DEPARTMENT_OTHER): Payer: BLUE CROSS/BLUE SHIELD | Attending: Internal Medicine

## 2018-03-06 DIAGNOSIS — I87332 Chronic venous hypertension (idiopathic) with ulcer and inflammation of left lower extremity: Secondary | ICD-10-CM | POA: Insufficient documentation

## 2018-03-06 DIAGNOSIS — I110 Hypertensive heart disease with heart failure: Secondary | ICD-10-CM | POA: Insufficient documentation

## 2018-03-06 DIAGNOSIS — L97521 Non-pressure chronic ulcer of other part of left foot limited to breakdown of skin: Secondary | ICD-10-CM | POA: Diagnosis not present

## 2018-03-06 DIAGNOSIS — E1140 Type 2 diabetes mellitus with diabetic neuropathy, unspecified: Secondary | ICD-10-CM | POA: Diagnosis not present

## 2018-03-06 DIAGNOSIS — I5042 Chronic combined systolic (congestive) and diastolic (congestive) heart failure: Secondary | ICD-10-CM | POA: Diagnosis not present

## 2018-03-06 DIAGNOSIS — I2510 Atherosclerotic heart disease of native coronary artery without angina pectoris: Secondary | ICD-10-CM | POA: Insufficient documentation

## 2018-03-06 DIAGNOSIS — I87312 Chronic venous hypertension (idiopathic) with ulcer of left lower extremity: Secondary | ICD-10-CM | POA: Diagnosis not present

## 2018-03-06 DIAGNOSIS — L97222 Non-pressure chronic ulcer of left calf with fat layer exposed: Secondary | ICD-10-CM | POA: Diagnosis not present

## 2018-03-06 DIAGNOSIS — L97822 Non-pressure chronic ulcer of other part of left lower leg with fat layer exposed: Secondary | ICD-10-CM | POA: Diagnosis not present

## 2018-03-07 DIAGNOSIS — T887XXA Unspecified adverse effect of drug or medicament, initial encounter: Secondary | ICD-10-CM | POA: Diagnosis not present

## 2018-03-12 ENCOUNTER — Other Ambulatory Visit: Payer: Self-pay | Admitting: Cardiology

## 2018-03-13 DIAGNOSIS — E1140 Type 2 diabetes mellitus with diabetic neuropathy, unspecified: Secondary | ICD-10-CM | POA: Diagnosis not present

## 2018-03-13 DIAGNOSIS — L97521 Non-pressure chronic ulcer of other part of left foot limited to breakdown of skin: Secondary | ICD-10-CM | POA: Diagnosis not present

## 2018-03-13 DIAGNOSIS — L97822 Non-pressure chronic ulcer of other part of left lower leg with fat layer exposed: Secondary | ICD-10-CM | POA: Diagnosis not present

## 2018-03-13 DIAGNOSIS — I87332 Chronic venous hypertension (idiopathic) with ulcer and inflammation of left lower extremity: Secondary | ICD-10-CM | POA: Diagnosis not present

## 2018-03-13 DIAGNOSIS — I2510 Atherosclerotic heart disease of native coronary artery without angina pectoris: Secondary | ICD-10-CM | POA: Diagnosis not present

## 2018-03-13 DIAGNOSIS — L97222 Non-pressure chronic ulcer of left calf with fat layer exposed: Secondary | ICD-10-CM | POA: Diagnosis not present

## 2018-03-13 DIAGNOSIS — I5042 Chronic combined systolic (congestive) and diastolic (congestive) heart failure: Secondary | ICD-10-CM | POA: Diagnosis not present

## 2018-03-13 DIAGNOSIS — I110 Hypertensive heart disease with heart failure: Secondary | ICD-10-CM | POA: Diagnosis not present

## 2018-03-18 ENCOUNTER — Ambulatory Visit (INDEPENDENT_AMBULATORY_CARE_PROVIDER_SITE_OTHER): Payer: BLUE CROSS/BLUE SHIELD | Admitting: *Deleted

## 2018-03-18 ENCOUNTER — Encounter: Payer: Self-pay | Admitting: Family Medicine

## 2018-03-18 ENCOUNTER — Other Ambulatory Visit: Payer: Self-pay | Admitting: Family Medicine

## 2018-03-18 ENCOUNTER — Ambulatory Visit (INDEPENDENT_AMBULATORY_CARE_PROVIDER_SITE_OTHER): Payer: BLUE CROSS/BLUE SHIELD | Admitting: Family Medicine

## 2018-03-18 ENCOUNTER — Other Ambulatory Visit: Payer: Self-pay

## 2018-03-18 VITALS — BP 98/64 | HR 92 | Temp 98.3°F | Ht 70.0 in | Wt 197.0 lb

## 2018-03-18 DIAGNOSIS — I255 Ischemic cardiomyopathy: Secondary | ICD-10-CM | POA: Diagnosis not present

## 2018-03-18 DIAGNOSIS — L03116 Cellulitis of left lower limb: Secondary | ICD-10-CM

## 2018-03-18 MED ORDER — CEPHALEXIN 500 MG PO CAPS: 500.0000 mg | ORAL_CAPSULE | Freq: Three times a day (TID) | ORAL | 0 refills | Status: AC

## 2018-03-18 MED ORDER — BAYER MICROLET LANCETS MISC: 11 refills | Status: DC

## 2018-03-18 NOTE — Progress Notes (Signed)
   Subjective:    Patient ID: Marcus Johnson, male    DOB: 02-21-1961, 57 y.o.   MRN: [id number]  HPI Here for 2 weeks of a sore spot on the left foot. No recent trauma.    Review of Systems  Constitutional: Negative.   Respiratory: Negative.   Cardiovascular: Positive for leg swelling. Negative for chest pain and palpitations.  Skin: Positive for color change.       Objective:   Physical Exam  Constitutional: He appears well-developed and well-nourished.  Cardiovascular: Normal rate, regular rhythm, normal heart sounds and intact distal pulses.  Pulmonary/Chest: Effort normal and breath sounds normal.  Musculoskeletal:  1+ edema in both lower legs   Skin:  The left lateral foot has a small blister which is red and tender          Assessment & Plan:  Foot blister, likely from rubbing on his shoes. He will get new shoes that are a bit wider. We will cover for infection with Keflex.  Alysia Penna, MD

## 2018-03-18 NOTE — Telephone Encounter (Signed)
Pt is using bayer microlet lancerts  Rx sent

## 2018-03-18 NOTE — Progress Notes (Signed)
Remote ICD transmission.   

## 2018-03-27 DIAGNOSIS — E1140 Type 2 diabetes mellitus with diabetic neuropathy, unspecified: Secondary | ICD-10-CM | POA: Diagnosis not present

## 2018-03-27 DIAGNOSIS — L97822 Non-pressure chronic ulcer of other part of left lower leg with fat layer exposed: Secondary | ICD-10-CM | POA: Diagnosis not present

## 2018-03-27 DIAGNOSIS — I2510 Atherosclerotic heart disease of native coronary artery without angina pectoris: Secondary | ICD-10-CM | POA: Diagnosis not present

## 2018-03-27 DIAGNOSIS — T887XXA Unspecified adverse effect of drug or medicament, initial encounter: Secondary | ICD-10-CM | POA: Diagnosis not present

## 2018-03-27 DIAGNOSIS — I110 Hypertensive heart disease with heart failure: Secondary | ICD-10-CM | POA: Diagnosis not present

## 2018-03-27 DIAGNOSIS — L97222 Non-pressure chronic ulcer of left calf with fat layer exposed: Secondary | ICD-10-CM | POA: Diagnosis not present

## 2018-03-27 DIAGNOSIS — L97521 Non-pressure chronic ulcer of other part of left foot limited to breakdown of skin: Secondary | ICD-10-CM | POA: Diagnosis not present

## 2018-03-27 DIAGNOSIS — I87332 Chronic venous hypertension (idiopathic) with ulcer and inflammation of left lower extremity: Secondary | ICD-10-CM | POA: Diagnosis not present

## 2018-03-27 DIAGNOSIS — L89893 Pressure ulcer of other site, stage 3: Secondary | ICD-10-CM | POA: Diagnosis not present

## 2018-03-27 DIAGNOSIS — I5042 Chronic combined systolic (congestive) and diastolic (congestive) heart failure: Secondary | ICD-10-CM | POA: Diagnosis not present

## 2018-04-01 ENCOUNTER — Encounter (HOSPITAL_COMMUNITY): Payer: BLUE CROSS/BLUE SHIELD

## 2018-04-03 DIAGNOSIS — L97521 Non-pressure chronic ulcer of other part of left foot limited to breakdown of skin: Secondary | ICD-10-CM | POA: Diagnosis not present

## 2018-04-03 DIAGNOSIS — E1140 Type 2 diabetes mellitus with diabetic neuropathy, unspecified: Secondary | ICD-10-CM | POA: Diagnosis not present

## 2018-04-03 DIAGNOSIS — L89893 Pressure ulcer of other site, stage 3: Secondary | ICD-10-CM | POA: Diagnosis not present

## 2018-04-03 DIAGNOSIS — I5042 Chronic combined systolic (congestive) and diastolic (congestive) heart failure: Secondary | ICD-10-CM | POA: Diagnosis not present

## 2018-04-03 DIAGNOSIS — L97822 Non-pressure chronic ulcer of other part of left lower leg with fat layer exposed: Secondary | ICD-10-CM | POA: Diagnosis not present

## 2018-04-03 DIAGNOSIS — I110 Hypertensive heart disease with heart failure: Secondary | ICD-10-CM | POA: Diagnosis not present

## 2018-04-03 DIAGNOSIS — T887XXA Unspecified adverse effect of drug or medicament, initial encounter: Secondary | ICD-10-CM | POA: Diagnosis not present

## 2018-04-03 DIAGNOSIS — S81802A Unspecified open wound, left lower leg, initial encounter: Secondary | ICD-10-CM | POA: Diagnosis not present

## 2018-04-03 DIAGNOSIS — I2510 Atherosclerotic heart disease of native coronary artery without angina pectoris: Secondary | ICD-10-CM | POA: Diagnosis not present

## 2018-04-03 DIAGNOSIS — I87332 Chronic venous hypertension (idiopathic) with ulcer and inflammation of left lower extremity: Secondary | ICD-10-CM | POA: Diagnosis not present

## 2018-04-05 ENCOUNTER — Other Ambulatory Visit (HOSPITAL_COMMUNITY): Payer: Self-pay | Admitting: Student

## 2018-04-11 LAB — CUP PACEART REMOTE DEVICE CHECK
Brady Statistic RV Percent Paced: 0 %
Date Time Interrogation Session: 20190813052208
HighPow Impedance: 266 Ohm
HighPow Impedance: 31 Ohm
HighPow Impedance: 38 Ohm
Implantable Lead Implant Date: 20130603
Implantable Lead Model: 6947
Implantable Pulse Generator Implant Date: 20130603
Lead Channel Pacing Threshold Amplitude: 1 V
Lead Channel Pacing Threshold Pulse Width: 0.4 ms
Lead Channel Sensing Intrinsic Amplitude: 8.25 mV
Lead Channel Sensing Intrinsic Amplitude: 8.25 mV
Lead Channel Setting Pacing Pulse Width: 0.4 ms
MDC IDC LEAD LOCATION: 753860
MDC IDC MSMT BATTERY VOLTAGE: 2.98 V
MDC IDC MSMT LEADCHNL RV IMPEDANCE VALUE: 323 Ohm
MDC IDC SET LEADCHNL RV PACING AMPLITUDE: 2.5 V
MDC IDC SET LEADCHNL RV SENSING SENSITIVITY: 0.3 mV

## 2018-04-18 ENCOUNTER — Encounter (HOSPITAL_BASED_OUTPATIENT_CLINIC_OR_DEPARTMENT_OTHER): Payer: BLUE CROSS/BLUE SHIELD | Attending: Internal Medicine

## 2018-04-18 DIAGNOSIS — L97822 Non-pressure chronic ulcer of other part of left lower leg with fat layer exposed: Secondary | ICD-10-CM | POA: Insufficient documentation

## 2018-04-18 DIAGNOSIS — I87332 Chronic venous hypertension (idiopathic) with ulcer and inflammation of left lower extremity: Secondary | ICD-10-CM | POA: Insufficient documentation

## 2018-04-18 DIAGNOSIS — I110 Hypertensive heart disease with heart failure: Secondary | ICD-10-CM | POA: Insufficient documentation

## 2018-04-18 DIAGNOSIS — E1140 Type 2 diabetes mellitus with diabetic neuropathy, unspecified: Secondary | ICD-10-CM | POA: Insufficient documentation

## 2018-04-18 DIAGNOSIS — L89892 Pressure ulcer of other site, stage 2: Secondary | ICD-10-CM | POA: Insufficient documentation

## 2018-04-18 DIAGNOSIS — I509 Heart failure, unspecified: Secondary | ICD-10-CM | POA: Diagnosis not present

## 2018-04-18 DIAGNOSIS — E11622 Type 2 diabetes mellitus with other skin ulcer: Secondary | ICD-10-CM | POA: Insufficient documentation

## 2018-04-18 DIAGNOSIS — I2510 Atherosclerotic heart disease of native coronary artery without angina pectoris: Secondary | ICD-10-CM | POA: Insufficient documentation

## 2018-04-29 ENCOUNTER — Telehealth (HOSPITAL_COMMUNITY): Payer: Self-pay | Admitting: Surgery

## 2018-04-29 NOTE — Telephone Encounter (Signed)
I called Mr Rakestraw to reschedule his appt. In [facility] on October 1.   He tells me that day did not "work" for him anyway and is open to moving his appt.  Kevan Rosebush clinic coordinator will call patient back to reschedule.  Mr. Lofgren is aware to call [facility] Clini with any needs or concerns.

## 2018-05-02 DIAGNOSIS — I2510 Atherosclerotic heart disease of native coronary artery without angina pectoris: Secondary | ICD-10-CM | POA: Diagnosis not present

## 2018-05-02 DIAGNOSIS — E1140 Type 2 diabetes mellitus with diabetic neuropathy, unspecified: Secondary | ICD-10-CM | POA: Diagnosis not present

## 2018-05-02 DIAGNOSIS — I87332 Chronic venous hypertension (idiopathic) with ulcer and inflammation of left lower extremity: Secondary | ICD-10-CM | POA: Diagnosis not present

## 2018-05-02 DIAGNOSIS — L89892 Pressure ulcer of other site, stage 2: Secondary | ICD-10-CM | POA: Diagnosis not present

## 2018-05-02 DIAGNOSIS — I110 Hypertensive heart disease with heart failure: Secondary | ICD-10-CM | POA: Diagnosis not present

## 2018-05-02 DIAGNOSIS — I509 Heart failure, unspecified: Secondary | ICD-10-CM | POA: Diagnosis not present

## 2018-05-06 ENCOUNTER — Encounter (HOSPITAL_COMMUNITY): Payer: BLUE CROSS/BLUE SHIELD | Admitting: Cardiology

## 2018-05-09 ENCOUNTER — Encounter (HOSPITAL_BASED_OUTPATIENT_CLINIC_OR_DEPARTMENT_OTHER): Payer: BLUE CROSS/BLUE SHIELD | Attending: Internal Medicine

## 2018-05-09 DIAGNOSIS — I2510 Atherosclerotic heart disease of native coronary artery without angina pectoris: Secondary | ICD-10-CM | POA: Insufficient documentation

## 2018-05-09 DIAGNOSIS — E11621 Type 2 diabetes mellitus with foot ulcer: Secondary | ICD-10-CM | POA: Insufficient documentation

## 2018-05-09 DIAGNOSIS — I110 Hypertensive heart disease with heart failure: Secondary | ICD-10-CM | POA: Insufficient documentation

## 2018-05-09 DIAGNOSIS — I87333 Chronic venous hypertension (idiopathic) with ulcer and inflammation of bilateral lower extremity: Secondary | ICD-10-CM | POA: Insufficient documentation

## 2018-05-09 DIAGNOSIS — L89622 Pressure ulcer of left heel, stage 2: Secondary | ICD-10-CM | POA: Diagnosis not present

## 2018-05-09 DIAGNOSIS — E1140 Type 2 diabetes mellitus with diabetic neuropathy, unspecified: Secondary | ICD-10-CM | POA: Insufficient documentation

## 2018-05-09 DIAGNOSIS — L97522 Non-pressure chronic ulcer of other part of left foot with fat layer exposed: Secondary | ICD-10-CM | POA: Insufficient documentation

## 2018-05-09 DIAGNOSIS — I509 Heart failure, unspecified: Secondary | ICD-10-CM | POA: Insufficient documentation

## 2018-05-16 DIAGNOSIS — L89892 Pressure ulcer of other site, stage 2: Secondary | ICD-10-CM | POA: Diagnosis not present

## 2018-05-23 DIAGNOSIS — T887XXA Unspecified adverse effect of drug or medicament, initial encounter: Secondary | ICD-10-CM | POA: Diagnosis not present

## 2018-05-23 DIAGNOSIS — L89622 Pressure ulcer of left heel, stage 2: Secondary | ICD-10-CM | POA: Diagnosis not present

## 2018-05-26 ENCOUNTER — Telehealth (HOSPITAL_COMMUNITY): Payer: Self-pay | Admitting: Pharmacist

## 2018-05-26 NOTE — Telephone Encounter (Signed)
-----   Message from Vanice Sarah, [place] sent at 05/26/2018  8:46 AM EDT ----- G[facility] Crown from [facility] [employer] [facility] [address] called due to Rx's for Digoxin and Atorvastin stating there may be conflict because of tachycardia. Can you assist with this? Thanks.

## 2018-05-26 NOTE — Telephone Encounter (Signed)
Spoke with [facility] at [facility] who stated that the torsemide with digoxin can increase the risk of digoxin toxicity. I advised the pharmacist to fill both medications as we are monitoring digoxin levels.   Ruta Hinds. Velva Harman, PharmD, BCPS, CPP Clinical Pharmacist Phone: [phone] 05/26/2018 9:16 AM

## 2018-05-30 ENCOUNTER — Ambulatory Visit (HOSPITAL_COMMUNITY)
Admission: RE | Admit: 2018-05-30 | Discharge: 2018-05-30 | Disposition: A | Discharge status: Home / Self Care | Payer: BLUE CROSS/BLUE SHIELD | Source: Ambulatory Visit | Attending: Internal Medicine | Admitting: Internal Medicine

## 2018-05-30 ENCOUNTER — Other Ambulatory Visit (HOSPITAL_COMMUNITY)
Admission: RE | Admit: 2018-05-30 | Discharge: 2018-05-30 | Disposition: A | Discharge status: Home / Self Care | Payer: Self-pay | Source: Other Acute Inpatient Hospital | Attending: Internal Medicine | Admitting: Internal Medicine

## 2018-05-30 ENCOUNTER — Other Ambulatory Visit (HOSPITAL_BASED_OUTPATIENT_CLINIC_OR_DEPARTMENT_OTHER): Payer: Self-pay | Admitting: Internal Medicine

## 2018-05-30 DIAGNOSIS — L89622 Pressure ulcer of left heel, stage 2: Secondary | ICD-10-CM | POA: Diagnosis not present

## 2018-05-30 DIAGNOSIS — L942 Calcinosis cutis: Secondary | ICD-10-CM | POA: Insufficient documentation

## 2018-05-30 DIAGNOSIS — M86172 Other acute osteomyelitis, left ankle and foot: Secondary | ICD-10-CM | POA: Insufficient documentation

## 2018-05-30 DIAGNOSIS — M79672 Pain in left foot: Secondary | ICD-10-CM | POA: Diagnosis not present

## 2018-05-30 DIAGNOSIS — B9689 Other specified bacterial agents as the cause of diseases classified elsewhere: Secondary | ICD-10-CM | POA: Insufficient documentation

## 2018-05-30 DIAGNOSIS — E11621 Type 2 diabetes mellitus with foot ulcer: Secondary | ICD-10-CM | POA: Insufficient documentation

## 2018-06-02 LAB — AEROBIC CULTURE W GRAM STAIN (SUPERFICIAL SPECIMEN)

## 2018-06-02 LAB — AEROBIC CULTURE  (SUPERFICIAL SPECIMEN)

## 2018-06-06 ENCOUNTER — Encounter (HOSPITAL_BASED_OUTPATIENT_CLINIC_OR_DEPARTMENT_OTHER): Payer: BLUE CROSS/BLUE SHIELD | Attending: Internal Medicine

## 2018-06-06 DIAGNOSIS — L89892 Pressure ulcer of other site, stage 2: Secondary | ICD-10-CM | POA: Insufficient documentation

## 2018-06-06 DIAGNOSIS — Z950 Presence of cardiac pacemaker: Secondary | ICD-10-CM | POA: Diagnosis not present

## 2018-06-06 DIAGNOSIS — I10 Essential (primary) hypertension: Secondary | ICD-10-CM | POA: Insufficient documentation

## 2018-06-06 DIAGNOSIS — I2510 Atherosclerotic heart disease of native coronary artery without angina pectoris: Secondary | ICD-10-CM | POA: Diagnosis not present

## 2018-06-06 DIAGNOSIS — E1140 Type 2 diabetes mellitus with diabetic neuropathy, unspecified: Secondary | ICD-10-CM | POA: Insufficient documentation

## 2018-06-06 DIAGNOSIS — L89622 Pressure ulcer of left heel, stage 2: Secondary | ICD-10-CM | POA: Diagnosis not present

## 2018-06-06 DIAGNOSIS — I87332 Chronic venous hypertension (idiopathic) with ulcer and inflammation of left lower extremity: Secondary | ICD-10-CM | POA: Insufficient documentation

## 2018-06-06 DIAGNOSIS — B9561 Methicillin susceptible Staphylococcus aureus infection as the cause of diseases classified elsewhere: Secondary | ICD-10-CM | POA: Diagnosis not present

## 2018-06-06 DIAGNOSIS — L03116 Cellulitis of left lower limb: Secondary | ICD-10-CM | POA: Diagnosis not present

## 2018-06-06 DIAGNOSIS — M79672 Pain in left foot: Secondary | ICD-10-CM | POA: Diagnosis not present

## 2018-06-13 DIAGNOSIS — Z950 Presence of cardiac pacemaker: Secondary | ICD-10-CM | POA: Diagnosis not present

## 2018-06-13 DIAGNOSIS — B9561 Methicillin susceptible Staphylococcus aureus infection as the cause of diseases classified elsewhere: Secondary | ICD-10-CM | POA: Diagnosis not present

## 2018-06-13 DIAGNOSIS — I87332 Chronic venous hypertension (idiopathic) with ulcer and inflammation of left lower extremity: Secondary | ICD-10-CM | POA: Diagnosis not present

## 2018-06-13 DIAGNOSIS — L89622 Pressure ulcer of left heel, stage 2: Secondary | ICD-10-CM | POA: Diagnosis not present

## 2018-06-13 DIAGNOSIS — L89892 Pressure ulcer of other site, stage 2: Secondary | ICD-10-CM | POA: Diagnosis not present

## 2018-06-13 DIAGNOSIS — I10 Essential (primary) hypertension: Secondary | ICD-10-CM | POA: Diagnosis not present

## 2018-06-13 DIAGNOSIS — E1140 Type 2 diabetes mellitus with diabetic neuropathy, unspecified: Secondary | ICD-10-CM | POA: Diagnosis not present

## 2018-06-13 DIAGNOSIS — I2510 Atherosclerotic heart disease of native coronary artery without angina pectoris: Secondary | ICD-10-CM | POA: Diagnosis not present

## 2018-06-17 ENCOUNTER — Ambulatory Visit (INDEPENDENT_AMBULATORY_CARE_PROVIDER_SITE_OTHER): Payer: Self-pay | Admitting: *Deleted

## 2018-06-17 ENCOUNTER — Telehealth: Payer: Self-pay | Admitting: Cardiology

## 2018-06-17 DIAGNOSIS — I255 Ischemic cardiomyopathy: Secondary | ICD-10-CM

## 2018-06-17 NOTE — Telephone Encounter (Signed)
LMOVM reminding pt to send remote transmission.   

## 2018-06-18 NOTE — Progress Notes (Signed)
Remote ICD transmission.   

## 2018-06-20 DIAGNOSIS — L89892 Pressure ulcer of other site, stage 2: Secondary | ICD-10-CM | POA: Diagnosis not present

## 2018-06-20 DIAGNOSIS — I10 Essential (primary) hypertension: Secondary | ICD-10-CM | POA: Diagnosis not present

## 2018-06-20 DIAGNOSIS — B9561 Methicillin susceptible Staphylococcus aureus infection as the cause of diseases classified elsewhere: Secondary | ICD-10-CM | POA: Diagnosis not present

## 2018-06-20 DIAGNOSIS — I2510 Atherosclerotic heart disease of native coronary artery without angina pectoris: Secondary | ICD-10-CM | POA: Diagnosis not present

## 2018-06-20 DIAGNOSIS — E1140 Type 2 diabetes mellitus with diabetic neuropathy, unspecified: Secondary | ICD-10-CM | POA: Diagnosis not present

## 2018-06-20 DIAGNOSIS — I87332 Chronic venous hypertension (idiopathic) with ulcer and inflammation of left lower extremity: Secondary | ICD-10-CM | POA: Diagnosis not present

## 2018-06-20 DIAGNOSIS — Z950 Presence of cardiac pacemaker: Secondary | ICD-10-CM | POA: Diagnosis not present

## 2018-06-23 ENCOUNTER — Other Ambulatory Visit (HOSPITAL_BASED_OUTPATIENT_CLINIC_OR_DEPARTMENT_OTHER): Payer: Self-pay | Admitting: Internal Medicine

## 2018-06-23 DIAGNOSIS — E13621 Other specified diabetes mellitus with foot ulcer: Secondary | ICD-10-CM

## 2018-06-23 DIAGNOSIS — L97529 Non-pressure chronic ulcer of other part of left foot with unspecified severity: Principal | ICD-10-CM

## 2018-06-27 DIAGNOSIS — L89892 Pressure ulcer of other site, stage 2: Secondary | ICD-10-CM | POA: Diagnosis not present

## 2018-06-27 DIAGNOSIS — I87332 Chronic venous hypertension (idiopathic) with ulcer and inflammation of left lower extremity: Secondary | ICD-10-CM | POA: Diagnosis not present

## 2018-06-27 DIAGNOSIS — B9561 Methicillin susceptible Staphylococcus aureus infection as the cause of diseases classified elsewhere: Secondary | ICD-10-CM | POA: Diagnosis not present

## 2018-06-27 DIAGNOSIS — Z950 Presence of cardiac pacemaker: Secondary | ICD-10-CM | POA: Diagnosis not present

## 2018-06-27 DIAGNOSIS — E11621 Type 2 diabetes mellitus with foot ulcer: Secondary | ICD-10-CM | POA: Diagnosis not present

## 2018-06-27 DIAGNOSIS — L97522 Non-pressure chronic ulcer of other part of left foot with fat layer exposed: Secondary | ICD-10-CM | POA: Diagnosis not present

## 2018-06-27 DIAGNOSIS — E1140 Type 2 diabetes mellitus with diabetic neuropathy, unspecified: Secondary | ICD-10-CM | POA: Diagnosis not present

## 2018-06-27 DIAGNOSIS — I2510 Atherosclerotic heart disease of native coronary artery without angina pectoris: Secondary | ICD-10-CM | POA: Diagnosis not present

## 2018-06-27 DIAGNOSIS — I10 Essential (primary) hypertension: Secondary | ICD-10-CM | POA: Diagnosis not present

## 2018-07-01 ENCOUNTER — Other Ambulatory Visit (HOSPITAL_BASED_OUTPATIENT_CLINIC_OR_DEPARTMENT_OTHER): Payer: Self-pay | Admitting: Internal Medicine

## 2018-07-01 DIAGNOSIS — L97529 Non-pressure chronic ulcer of other part of left foot with unspecified severity: Principal | ICD-10-CM

## 2018-07-01 DIAGNOSIS — E11621 Type 2 diabetes mellitus with foot ulcer: Secondary | ICD-10-CM

## 2018-07-07 ENCOUNTER — Other Ambulatory Visit (HOSPITAL_COMMUNITY): Payer: Self-pay | Admitting: Cardiology

## 2018-07-07 ENCOUNTER — Ambulatory Visit (HOSPITAL_COMMUNITY)
Admission: RE | Admit: 2018-07-07 | Discharge: 2018-07-07 | Disposition: A | Discharge status: Home / Self Care | Payer: BLUE CROSS/BLUE SHIELD | Source: Ambulatory Visit | Attending: Internal Medicine | Admitting: Internal Medicine

## 2018-07-07 DIAGNOSIS — R600 Localized edema: Secondary | ICD-10-CM | POA: Diagnosis not present

## 2018-07-07 DIAGNOSIS — I5042 Chronic combined systolic (congestive) and diastolic (congestive) heart failure: Secondary | ICD-10-CM

## 2018-07-07 DIAGNOSIS — E11621 Type 2 diabetes mellitus with foot ulcer: Secondary | ICD-10-CM | POA: Diagnosis not present

## 2018-07-07 DIAGNOSIS — E13621 Other specified diabetes mellitus with foot ulcer: Secondary | ICD-10-CM | POA: Diagnosis not present

## 2018-07-07 DIAGNOSIS — L97529 Non-pressure chronic ulcer of other part of left foot with unspecified severity: Secondary | ICD-10-CM | POA: Diagnosis not present

## 2018-07-07 LAB — POCT I-STAT CREATININE: Creatinine, Ser: 1.8 mg/dL — ABNORMAL HIGH (ref 0.61–1.24)

## 2018-07-07 MED ORDER — SODIUM CHLORIDE (PF) 0.9 % IJ SOLN
INTRAMUSCULAR | Status: AC
  Filled 2018-07-07: qty 50

## 2018-07-07 MED ORDER — IOHEXOL 300 MG/ML  SOLN
75.0000 mL | Freq: Once | INTRAMUSCULAR | Status: AC | PRN
  Administered 2018-07-07: 75 mL via INTRAVENOUS

## 2018-07-11 ENCOUNTER — Encounter (HOSPITAL_BASED_OUTPATIENT_CLINIC_OR_DEPARTMENT_OTHER): Payer: BLUE CROSS/BLUE SHIELD | Attending: Internal Medicine

## 2018-07-11 ENCOUNTER — Other Ambulatory Visit (HOSPITAL_COMMUNITY)
Admission: RE | Admit: 2018-07-11 | Discharge: 2018-07-11 | Disposition: A | Discharge status: Home / Self Care | Payer: BLUE CROSS/BLUE SHIELD | Source: Ambulatory Visit | Attending: Internal Medicine | Admitting: Internal Medicine

## 2018-07-11 DIAGNOSIS — L98496 Non-pressure chronic ulcer of skin of other sites with bone involvement without evidence of necrosis: Secondary | ICD-10-CM | POA: Diagnosis not present

## 2018-07-11 DIAGNOSIS — E11621 Type 2 diabetes mellitus with foot ulcer: Secondary | ICD-10-CM | POA: Diagnosis not present

## 2018-07-11 DIAGNOSIS — B9561 Methicillin susceptible Staphylococcus aureus infection as the cause of diseases classified elsewhere: Secondary | ICD-10-CM | POA: Insufficient documentation

## 2018-07-11 DIAGNOSIS — Z79899 Other long term (current) drug therapy: Secondary | ICD-10-CM | POA: Diagnosis not present

## 2018-07-11 DIAGNOSIS — E1140 Type 2 diabetes mellitus with diabetic neuropathy, unspecified: Secondary | ICD-10-CM | POA: Insufficient documentation

## 2018-07-11 DIAGNOSIS — L97522 Non-pressure chronic ulcer of other part of left foot with fat layer exposed: Secondary | ICD-10-CM | POA: Diagnosis not present

## 2018-07-11 DIAGNOSIS — Z95810 Presence of automatic (implantable) cardiac defibrillator: Secondary | ICD-10-CM | POA: Diagnosis not present

## 2018-07-11 DIAGNOSIS — I509 Heart failure, unspecified: Secondary | ICD-10-CM | POA: Insufficient documentation

## 2018-07-11 DIAGNOSIS — Z951 Presence of aortocoronary bypass graft: Secondary | ICD-10-CM | POA: Insufficient documentation

## 2018-07-11 DIAGNOSIS — L89892 Pressure ulcer of other site, stage 2: Secondary | ICD-10-CM | POA: Diagnosis not present

## 2018-07-11 DIAGNOSIS — I110 Hypertensive heart disease with heart failure: Secondary | ICD-10-CM | POA: Insufficient documentation

## 2018-07-11 DIAGNOSIS — M86372 Chronic multifocal osteomyelitis, left ankle and foot: Secondary | ICD-10-CM | POA: Diagnosis not present

## 2018-07-11 DIAGNOSIS — E1169 Type 2 diabetes mellitus with other specified complication: Secondary | ICD-10-CM | POA: Insufficient documentation

## 2018-07-11 DIAGNOSIS — I2510 Atherosclerotic heart disease of native coronary artery without angina pectoris: Secondary | ICD-10-CM | POA: Insufficient documentation

## 2018-07-11 LAB — CBC WITH DIFFERENTIAL/PLATELET
Abs Immature Granulocytes: 0.02 10*3/uL (ref 0.00–0.07)
Basophils Absolute: 0 10*3/uL (ref 0.0–0.1)
Basophils Relative: 1 %
EOS PCT: 2 %
Eosinophils Absolute: 0.1 10*3/uL (ref 0.0–0.5)
HEMATOCRIT: 38.5 % — AB (ref 39.0–52.0)
HEMOGLOBIN: 11.5 g/dL — AB (ref 13.0–17.0)
Immature Granulocytes: 0 %
LYMPHS ABS: 1.8 10*3/uL (ref 0.7–4.0)
LYMPHS PCT: 27 %
MCH: 27.7 pg (ref 26.0–34.0)
MCHC: 29.9 g/dL — AB (ref 30.0–36.0)
MCV: 92.8 fL (ref 80.0–100.0)
MONO ABS: 0.8 10*3/uL (ref 0.1–1.0)
MONOS PCT: 12 %
Neutro Abs: 3.8 10*3/uL (ref 1.7–7.7)
Neutrophils Relative %: 58 %
Platelets: 186 10*3/uL (ref 150–400)
RBC: 4.15 MIL/uL — ABNORMAL LOW (ref 4.22–5.81)
RDW: 14.6 % (ref 11.5–15.5)
WBC: 6.5 10*3/uL (ref 4.0–10.5)
nRBC: 0 % (ref 0.0–0.2)

## 2018-07-11 LAB — BASIC METABOLIC PANEL
ANION GAP: 11 (ref 5–15)
BUN: 55 mg/dL — AB (ref 6–20)
CHLORIDE: 107 mmol/L (ref 98–111)
CO2: 24 mmol/L (ref 22–32)
CREATININE: 1.7 mg/dL — AB (ref 0.61–1.24)
Calcium: 8.6 mg/dL — ABNORMAL LOW (ref 8.9–10.3)
GFR calc Af Amer: 51 mL/min — ABNORMAL LOW (ref >60)
GFR calc non Af Amer: 44 mL/min — ABNORMAL LOW (ref >60)
GLUCOSE: 132 mg/dL — AB (ref 70–99)
POTASSIUM: 3.5 mmol/L (ref 3.5–5.1)
Sodium: 142 mmol/L (ref 135–145)

## 2018-07-11 LAB — SEDIMENTATION RATE: SED RATE: 28 mm/h — AB (ref 0–16)

## 2018-07-11 LAB — C-REACTIVE PROTEIN: CRP: 2 mg/dL — ABNORMAL HIGH (ref <1.0)

## 2018-07-18 ENCOUNTER — Other Ambulatory Visit (HOSPITAL_COMMUNITY)
Admission: RE | Admit: 2018-07-18 | Discharge: 2018-07-18 | Disposition: A | Discharge status: Home / Self Care | Payer: BLUE CROSS/BLUE SHIELD | Source: Other Acute Inpatient Hospital | Attending: Internal Medicine | Admitting: Internal Medicine

## 2018-07-18 DIAGNOSIS — I2510 Atherosclerotic heart disease of native coronary artery without angina pectoris: Secondary | ICD-10-CM | POA: Diagnosis not present

## 2018-07-18 DIAGNOSIS — Z95810 Presence of automatic (implantable) cardiac defibrillator: Secondary | ICD-10-CM | POA: Diagnosis not present

## 2018-07-18 DIAGNOSIS — M86672 Other chronic osteomyelitis, left ankle and foot: Secondary | ICD-10-CM | POA: Diagnosis not present

## 2018-07-18 DIAGNOSIS — L98496 Non-pressure chronic ulcer of skin of other sites with bone involvement without evidence of necrosis: Secondary | ICD-10-CM | POA: Diagnosis not present

## 2018-07-18 DIAGNOSIS — Z951 Presence of aortocoronary bypass graft: Secondary | ICD-10-CM | POA: Diagnosis not present

## 2018-07-18 DIAGNOSIS — E11628 Type 2 diabetes mellitus with other skin complications: Secondary | ICD-10-CM | POA: Diagnosis not present

## 2018-07-18 DIAGNOSIS — E1140 Type 2 diabetes mellitus with diabetic neuropathy, unspecified: Secondary | ICD-10-CM | POA: Diagnosis not present

## 2018-07-18 DIAGNOSIS — I110 Hypertensive heart disease with heart failure: Secondary | ICD-10-CM | POA: Diagnosis not present

## 2018-07-18 DIAGNOSIS — I509 Heart failure, unspecified: Secondary | ICD-10-CM | POA: Diagnosis not present

## 2018-07-18 DIAGNOSIS — M86372 Chronic multifocal osteomyelitis, left ankle and foot: Secondary | ICD-10-CM | POA: Diagnosis not present

## 2018-07-18 DIAGNOSIS — B9561 Methicillin susceptible Staphylococcus aureus infection as the cause of diseases classified elsewhere: Secondary | ICD-10-CM | POA: Diagnosis not present

## 2018-07-18 DIAGNOSIS — Z79899 Other long term (current) drug therapy: Secondary | ICD-10-CM | POA: Diagnosis not present

## 2018-07-18 DIAGNOSIS — L97522 Non-pressure chronic ulcer of other part of left foot with fat layer exposed: Secondary | ICD-10-CM | POA: Insufficient documentation

## 2018-07-18 DIAGNOSIS — E1169 Type 2 diabetes mellitus with other specified complication: Secondary | ICD-10-CM | POA: Diagnosis not present

## 2018-07-18 DIAGNOSIS — E11621 Type 2 diabetes mellitus with foot ulcer: Secondary | ICD-10-CM | POA: Diagnosis not present

## 2018-07-18 DIAGNOSIS — L89622 Pressure ulcer of left heel, stage 2: Secondary | ICD-10-CM | POA: Diagnosis not present

## 2018-07-20 LAB — AEROBIC CULTURE W GRAM STAIN (SUPERFICIAL SPECIMEN): Gram Stain: NONE SEEN

## 2018-07-20 LAB — AEROBIC CULTURE  (SUPERFICIAL SPECIMEN)

## 2018-07-25 DIAGNOSIS — L98496 Non-pressure chronic ulcer of skin of other sites with bone involvement without evidence of necrosis: Secondary | ICD-10-CM | POA: Diagnosis not present

## 2018-07-25 DIAGNOSIS — E11621 Type 2 diabetes mellitus with foot ulcer: Secondary | ICD-10-CM | POA: Diagnosis not present

## 2018-07-25 DIAGNOSIS — B9561 Methicillin susceptible Staphylococcus aureus infection as the cause of diseases classified elsewhere: Secondary | ICD-10-CM | POA: Diagnosis not present

## 2018-07-25 DIAGNOSIS — I110 Hypertensive heart disease with heart failure: Secondary | ICD-10-CM | POA: Diagnosis not present

## 2018-07-25 DIAGNOSIS — E1169 Type 2 diabetes mellitus with other specified complication: Secondary | ICD-10-CM | POA: Diagnosis not present

## 2018-07-25 DIAGNOSIS — Z79899 Other long term (current) drug therapy: Secondary | ICD-10-CM | POA: Diagnosis not present

## 2018-07-25 DIAGNOSIS — I509 Heart failure, unspecified: Secondary | ICD-10-CM | POA: Diagnosis not present

## 2018-07-25 DIAGNOSIS — Z951 Presence of aortocoronary bypass graft: Secondary | ICD-10-CM | POA: Diagnosis not present

## 2018-07-25 DIAGNOSIS — Z95810 Presence of automatic (implantable) cardiac defibrillator: Secondary | ICD-10-CM | POA: Diagnosis not present

## 2018-07-25 DIAGNOSIS — L89899 Pressure ulcer of other site, unspecified stage: Secondary | ICD-10-CM | POA: Diagnosis not present

## 2018-07-25 DIAGNOSIS — I2510 Atherosclerotic heart disease of native coronary artery without angina pectoris: Secondary | ICD-10-CM | POA: Diagnosis not present

## 2018-07-25 DIAGNOSIS — M86372 Chronic multifocal osteomyelitis, left ankle and foot: Secondary | ICD-10-CM | POA: Diagnosis not present

## 2018-07-25 DIAGNOSIS — E1140 Type 2 diabetes mellitus with diabetic neuropathy, unspecified: Secondary | ICD-10-CM | POA: Diagnosis not present

## 2018-08-01 ENCOUNTER — Inpatient Hospital Stay (HOSPITAL_COMMUNITY)
Admission: EM | Admit: 2018-08-01 | Discharge: 2018-08-07 | DRG: 291 | Disposition: A | Discharge status: Home Health | Payer: BLUE CROSS/BLUE SHIELD | Attending: Internal Medicine | Admitting: Internal Medicine

## 2018-08-01 ENCOUNTER — Emergency Department: Payer: Self-pay

## 2018-08-01 ENCOUNTER — Encounter (HOSPITAL_COMMUNITY): Payer: Self-pay

## 2018-08-01 ENCOUNTER — Telehealth (HOSPITAL_COMMUNITY): Payer: Self-pay

## 2018-08-01 ENCOUNTER — Other Ambulatory Visit: Payer: Self-pay

## 2018-08-01 ENCOUNTER — Emergency Department (HOSPITAL_COMMUNITY): Payer: BLUE CROSS/BLUE SHIELD

## 2018-08-01 ENCOUNTER — Emergency Department (HOSPITAL_BASED_OUTPATIENT_CLINIC_OR_DEPARTMENT_OTHER)
Admit: 2018-08-01 | Discharge: 2018-08-01 | Disposition: A | Discharge status: Home / Self Care | Payer: BLUE CROSS/BLUE SHIELD | Attending: Emergency Medicine | Admitting: Emergency Medicine

## 2018-08-01 DIAGNOSIS — E11621 Type 2 diabetes mellitus with foot ulcer: Secondary | ICD-10-CM | POA: Diagnosis not present

## 2018-08-01 DIAGNOSIS — E669 Obesity, unspecified: Secondary | ICD-10-CM | POA: Diagnosis not present

## 2018-08-01 DIAGNOSIS — K5900 Constipation, unspecified: Secondary | ICD-10-CM | POA: Diagnosis not present

## 2018-08-01 DIAGNOSIS — Z95810 Presence of automatic (implantable) cardiac defibrillator: Secondary | ICD-10-CM

## 2018-08-01 DIAGNOSIS — Z79899 Other long term (current) drug therapy: Secondary | ICD-10-CM | POA: Diagnosis not present

## 2018-08-01 DIAGNOSIS — Z7982 Long term (current) use of aspirin: Secondary | ICD-10-CM

## 2018-08-01 DIAGNOSIS — Z833 Family history of diabetes mellitus: Secondary | ICD-10-CM | POA: Diagnosis not present

## 2018-08-01 DIAGNOSIS — E1151 Type 2 diabetes mellitus with diabetic peripheral angiopathy without gangrene: Secondary | ICD-10-CM | POA: Diagnosis not present

## 2018-08-01 DIAGNOSIS — M109 Gout, unspecified: Secondary | ICD-10-CM | POA: Diagnosis not present

## 2018-08-01 DIAGNOSIS — E785 Hyperlipidemia, unspecified: Secondary | ICD-10-CM | POA: Diagnosis present

## 2018-08-01 DIAGNOSIS — I2510 Atherosclerotic heart disease of native coronary artery without angina pectoris: Secondary | ICD-10-CM | POA: Diagnosis not present

## 2018-08-01 DIAGNOSIS — I5023 Acute on chronic systolic (congestive) heart failure: Secondary | ICD-10-CM | POA: Diagnosis not present

## 2018-08-01 DIAGNOSIS — N183 Chronic kidney disease, stage 3 (moderate): Secondary | ICD-10-CM | POA: Diagnosis not present

## 2018-08-01 DIAGNOSIS — Z888 Allergy status to other drugs, medicaments and biological substances status: Secondary | ICD-10-CM | POA: Diagnosis not present

## 2018-08-01 DIAGNOSIS — I10 Essential (primary) hypertension: Secondary | ICD-10-CM

## 2018-08-01 DIAGNOSIS — I5021 Acute systolic (congestive) heart failure: Secondary | ICD-10-CM

## 2018-08-01 DIAGNOSIS — I444 Left anterior fascicular block: Secondary | ICD-10-CM | POA: Diagnosis not present

## 2018-08-01 DIAGNOSIS — Z794 Long term (current) use of insulin: Secondary | ICD-10-CM

## 2018-08-01 DIAGNOSIS — Z951 Presence of aortocoronary bypass graft: Secondary | ICD-10-CM | POA: Diagnosis not present

## 2018-08-01 DIAGNOSIS — M869 Osteomyelitis, unspecified: Secondary | ICD-10-CM | POA: Diagnosis not present

## 2018-08-01 DIAGNOSIS — I5043 Acute on chronic combined systolic (congestive) and diastolic (congestive) heart failure: Secondary | ICD-10-CM | POA: Diagnosis not present

## 2018-08-01 DIAGNOSIS — R229 Localized swelling, mass and lump, unspecified: Secondary | ICD-10-CM | POA: Diagnosis not present

## 2018-08-01 DIAGNOSIS — I5020 Unspecified systolic (congestive) heart failure: Secondary | ICD-10-CM | POA: Diagnosis present

## 2018-08-01 DIAGNOSIS — M8600 Acute hematogenous osteomyelitis, unspecified site: Secondary | ICD-10-CM | POA: Diagnosis not present

## 2018-08-01 DIAGNOSIS — L03119 Cellulitis of unspecified part of limb: Secondary | ICD-10-CM | POA: Diagnosis not present

## 2018-08-01 DIAGNOSIS — I255 Ischemic cardiomyopathy: Secondary | ICD-10-CM | POA: Diagnosis present

## 2018-08-01 DIAGNOSIS — I472 Ventricular tachycardia: Secondary | ICD-10-CM | POA: Diagnosis not present

## 2018-08-01 DIAGNOSIS — Z8249 Family history of ischemic heart disease and other diseases of the circulatory system: Secondary | ICD-10-CM

## 2018-08-01 DIAGNOSIS — E1122 Type 2 diabetes mellitus with diabetic chronic kidney disease: Secondary | ICD-10-CM | POA: Diagnosis not present

## 2018-08-01 DIAGNOSIS — Z9114 Patient's other noncompliance with medication regimen: Secondary | ICD-10-CM

## 2018-08-01 DIAGNOSIS — I252 Old myocardial infarction: Secondary | ICD-10-CM | POA: Diagnosis not present

## 2018-08-01 DIAGNOSIS — I110 Hypertensive heart disease with heart failure: Secondary | ICD-10-CM | POA: Diagnosis not present

## 2018-08-01 DIAGNOSIS — Z8614 Personal history of Methicillin resistant Staphylococcus aureus infection: Secondary | ICD-10-CM

## 2018-08-01 DIAGNOSIS — M86672 Other chronic osteomyelitis, left ankle and foot: Secondary | ICD-10-CM | POA: Diagnosis not present

## 2018-08-01 DIAGNOSIS — E11649 Type 2 diabetes mellitus with hypoglycemia without coma: Secondary | ICD-10-CM | POA: Diagnosis not present

## 2018-08-01 DIAGNOSIS — N189 Chronic kidney disease, unspecified: Secondary | ICD-10-CM | POA: Diagnosis not present

## 2018-08-01 DIAGNOSIS — R918 Other nonspecific abnormal finding of lung field: Secondary | ICD-10-CM | POA: Diagnosis not present

## 2018-08-01 DIAGNOSIS — I130 Hypertensive heart and chronic kidney disease with heart failure and stage 1 through stage 4 chronic kidney disease, or unspecified chronic kidney disease: Principal | ICD-10-CM | POA: Diagnosis present

## 2018-08-01 DIAGNOSIS — S91302A Unspecified open wound, left foot, initial encounter: Secondary | ICD-10-CM | POA: Diagnosis not present

## 2018-08-01 DIAGNOSIS — A4901 Methicillin susceptible Staphylococcus aureus infection, unspecified site: Secondary | ICD-10-CM | POA: Diagnosis present

## 2018-08-01 DIAGNOSIS — B9561 Methicillin susceptible Staphylococcus aureus infection as the cause of diseases classified elsewhere: Secondary | ICD-10-CM | POA: Diagnosis not present

## 2018-08-01 DIAGNOSIS — M86372 Chronic multifocal osteomyelitis, left ankle and foot: Secondary | ICD-10-CM | POA: Diagnosis not present

## 2018-08-01 DIAGNOSIS — I361 Nonrheumatic tricuspid (valve) insufficiency: Secondary | ICD-10-CM | POA: Diagnosis not present

## 2018-08-01 DIAGNOSIS — I509 Heart failure, unspecified: Secondary | ICD-10-CM | POA: Diagnosis not present

## 2018-08-01 DIAGNOSIS — A4902 Methicillin resistant Staphylococcus aureus infection, unspecified site: Secondary | ICD-10-CM | POA: Diagnosis not present

## 2018-08-01 DIAGNOSIS — L89899 Pressure ulcer of other site, unspecified stage: Secondary | ICD-10-CM | POA: Diagnosis not present

## 2018-08-01 DIAGNOSIS — Z6828 Body mass index (BMI) 28.0-28.9, adult: Secondary | ICD-10-CM

## 2018-08-01 DIAGNOSIS — E1140 Type 2 diabetes mellitus with diabetic neuropathy, unspecified: Secondary | ICD-10-CM | POA: Diagnosis not present

## 2018-08-01 DIAGNOSIS — I739 Peripheral vascular disease, unspecified: Secondary | ICD-10-CM | POA: Diagnosis not present

## 2018-08-01 DIAGNOSIS — L98496 Non-pressure chronic ulcer of skin of other sites with bone involvement without evidence of necrosis: Secondary | ICD-10-CM | POA: Diagnosis not present

## 2018-08-01 DIAGNOSIS — M86172 Other acute osteomyelitis, left ankle and foot: Secondary | ICD-10-CM | POA: Diagnosis not present

## 2018-08-01 DIAGNOSIS — E1169 Type 2 diabetes mellitus with other specified complication: Secondary | ICD-10-CM | POA: Diagnosis not present

## 2018-08-01 LAB — CBC
HEMATOCRIT: 39.3 % (ref 39.0–52.0)
Hemoglobin: 11.7 g/dL — ABNORMAL LOW (ref 13.0–17.0)
MCH: 26.2 pg (ref 26.0–34.0)
MCHC: 29.8 g/dL — ABNORMAL LOW (ref 30.0–36.0)
MCV: 88.1 fL (ref 80.0–100.0)
Platelets: 234 10*3/uL (ref 150–400)
RBC: 4.46 MIL/uL (ref 4.22–5.81)
RDW: 15.3 % (ref 11.5–15.5)
WBC: 8.1 10*3/uL (ref 4.0–10.5)
nRBC: 0 % (ref 0.0–0.2)

## 2018-08-01 LAB — CBC WITH DIFFERENTIAL/PLATELET
Abs Immature Granulocytes: 0.04 10*3/uL (ref 0.00–0.07)
BASOS ABS: 0 10*3/uL (ref 0.0–0.1)
Basophils Relative: 0 %
EOS ABS: 0.1 10*3/uL (ref 0.0–0.5)
EOS PCT: 1 %
HEMATOCRIT: 37.7 % — AB (ref 39.0–52.0)
HEMOGLOBIN: 11.5 g/dL — AB (ref 13.0–17.0)
Immature Granulocytes: 1 %
LYMPHS ABS: 1.6 10*3/uL (ref 0.7–4.0)
Lymphocytes Relative: 19 %
MCH: 26.6 pg (ref 26.0–34.0)
MCHC: 30.5 g/dL (ref 30.0–36.0)
MCV: 87.1 fL (ref 80.0–100.0)
MONOS PCT: 11 %
Monocytes Absolute: 0.9 10*3/uL (ref 0.1–1.0)
NRBC: 0 % (ref 0.0–0.2)
Neutro Abs: 5.6 10*3/uL (ref 1.7–7.7)
Neutrophils Relative %: 68 %
Platelets: 245 10*3/uL (ref 150–400)
RBC: 4.33 MIL/uL (ref 4.22–5.81)
RDW: 15.2 % (ref 11.5–15.5)
WBC: 8.3 10*3/uL (ref 4.0–10.5)

## 2018-08-01 LAB — CREATININE, SERUM
Creatinine, Ser: 1.5 mg/dL — ABNORMAL HIGH (ref 0.61–1.24)
GFR calc non Af Amer: 51 mL/min — ABNORMAL LOW (ref >60)
GFR, EST AFRICAN AMERICAN: 59 mL/min — AB (ref >60)

## 2018-08-01 LAB — BASIC METABOLIC PANEL
Anion gap: 8 (ref 5–15)
BUN: 37 mg/dL — AB (ref 6–20)
CALCIUM: 8.8 mg/dL — AB (ref 8.9–10.3)
CHLORIDE: 108 mmol/L (ref 98–111)
CO2: 26 mmol/L (ref 22–32)
CREATININE: 1.43 mg/dL — AB (ref 0.61–1.24)
GFR calc non Af Amer: 54 mL/min — ABNORMAL LOW (ref >60)
Glucose, Bld: 144 mg/dL — ABNORMAL HIGH (ref 70–99)
Potassium: 4.7 mmol/L (ref 3.5–5.1)
Sodium: 142 mmol/L (ref 135–145)

## 2018-08-01 LAB — BRAIN NATRIURETIC PEPTIDE: B Natriuretic Peptide: 1083.3 pg/mL — ABNORMAL HIGH (ref 0.0–100.0)

## 2018-08-01 LAB — GLUCOSE, CAPILLARY
GLUCOSE-CAPILLARY: 81 mg/dL (ref 70–99)
Glucose-Capillary: 124 mg/dL — ABNORMAL HIGH (ref 70–99)
Glucose-Capillary: 74 mg/dL (ref 70–99)

## 2018-08-01 LAB — TROPONIN I
TROPONIN I: 0.06 ng/mL — AB (ref <0.03)
Troponin I: 0.06 ng/mL (ref <0.03)

## 2018-08-01 LAB — DIGOXIN LEVEL: Digoxin Level: <0.2 ng/mL — ABNORMAL LOW (ref 0.8–2.0)

## 2018-08-01 LAB — SEDIMENTATION RATE: Sed Rate: 22 mm/hr — ABNORMAL HIGH (ref 0–16)

## 2018-08-01 LAB — MAGNESIUM: Magnesium: 2.2 mg/dL (ref 1.7–2.4)

## 2018-08-01 MED ORDER — ASPIRIN EC 81 MG PO TBEC
81.0000 mg | DELAYED_RELEASE_TABLET | Freq: Every day | ORAL | Status: DC
  Administered 2018-08-01 – 2018-08-07 (×7): 81 mg via ORAL
  Filled 2018-08-01 (×7): qty 1

## 2018-08-01 MED ORDER — INSULIN ASPART 100 UNIT/ML ~~LOC~~ SOLN
5.0000 [IU] | Freq: Three times a day (TID) | SUBCUTANEOUS | Status: DC
  Administered 2018-08-01 – 2018-08-07 (×13): 5 [IU] via SUBCUTANEOUS

## 2018-08-01 MED ORDER — SODIUM CHLORIDE 0.9 % IV SOLN: 250.0000 mL | INTRAVENOUS | Status: DC | PRN

## 2018-08-01 MED ORDER — FUROSEMIDE 10 MG/ML IJ SOLN
80.0000 mg | Freq: Three times a day (TID) | INTRAMUSCULAR | Status: DC
  Administered 2018-08-01 – 2018-08-04 (×10): 80 mg via INTRAVENOUS
  Filled 2018-08-01 (×10): qty 8

## 2018-08-01 MED ORDER — SODIUM CHLORIDE 0.9% FLUSH: 3.0000 mL | Freq: Two times a day (BID) | INTRAVENOUS | Status: DC

## 2018-08-01 MED ORDER — FUROSEMIDE 10 MG/ML IJ SOLN
80.0000 mg | Freq: Once | INTRAMUSCULAR | Status: AC
  Administered 2018-08-01: 80 mg via INTRAVENOUS
  Filled 2018-08-01: qty 8

## 2018-08-01 MED ORDER — SODIUM CHLORIDE 0.9% FLUSH: 3.0000 mL | INTRAVENOUS | Status: DC | PRN

## 2018-08-01 MED ORDER — INSULIN ASPART 100 UNIT/ML ~~LOC~~ SOLN: 0.0000 [IU] | Freq: Every day | SUBCUTANEOUS | Status: DC

## 2018-08-01 MED ORDER — ONDANSETRON HCL 4 MG/2ML IJ SOLN: 4.0000 mg | Freq: Four times a day (QID) | INTRAMUSCULAR | Status: DC | PRN

## 2018-08-01 MED ORDER — SPIRONOLACTONE 25 MG PO TABS
25.0000 mg | ORAL_TABLET | Freq: Every day | ORAL | Status: DC
  Administered 2018-08-01 – 2018-08-07 (×7): 25 mg via ORAL
  Filled 2018-08-01 (×8): qty 1

## 2018-08-01 MED ORDER — INSULIN GLARGINE 100 UNIT/ML ~~LOC~~ SOLN
30.0000 [IU] | Freq: Every day | SUBCUTANEOUS | Status: DC
  Administered 2018-08-01: 30 [IU] via SUBCUTANEOUS
  Filled 2018-08-01 (×3): qty 0.3

## 2018-08-01 MED ORDER — ACETAMINOPHEN 325 MG PO TABS
650.0000 mg | ORAL_TABLET | ORAL | Status: DC | PRN
  Administered 2018-08-02: 650 mg via ORAL
  Filled 2018-08-01: qty 2

## 2018-08-01 MED ORDER — ENOXAPARIN SODIUM 40 MG/0.4ML ~~LOC~~ SOLN
40.0000 mg | SUBCUTANEOUS | Status: DC
  Administered 2018-08-01 – 2018-08-06 (×6): 40 mg via SUBCUTANEOUS
  Filled 2018-08-01 (×6): qty 0.4

## 2018-08-01 MED ORDER — ROSUVASTATIN CALCIUM 20 MG PO TABS
40.0000 mg | ORAL_TABLET | Freq: Every day | ORAL | Status: DC
  Administered 2018-08-01 – 2018-08-06 (×6): 40 mg via ORAL
  Filled 2018-08-01 (×2): qty 2
  Filled 2018-08-01: qty 8
  Filled 2018-08-01 (×4): qty 2

## 2018-08-01 MED ORDER — INSULIN ASPART 100 UNIT/ML ~~LOC~~ SOLN
0.0000 [IU] | Freq: Three times a day (TID) | SUBCUTANEOUS | Status: DC
  Administered 2018-08-01: 3 [IU] via SUBCUTANEOUS
  Administered 2018-08-03: 4 [IU] via SUBCUTANEOUS

## 2018-08-01 MED ORDER — ALLOPURINOL 300 MG PO TABS
600.0000 mg | ORAL_TABLET | Freq: Every day | ORAL | Status: DC
  Administered 2018-08-01 – 2018-08-07 (×6): 600 mg via ORAL
  Filled 2018-08-01 (×7): qty 2

## 2018-08-01 MED ORDER — INSULIN ASPART 100 UNIT/ML ~~LOC~~ SOLN: 6.0000 [IU] | Freq: Three times a day (TID) | SUBCUTANEOUS | Status: DC

## 2018-08-01 MED ORDER — SODIUM CHLORIDE 0.9% FLUSH
10.0000 mL | Freq: Two times a day (BID) | INTRAVENOUS | Status: DC
  Administered 2018-08-01 – 2018-08-07 (×12): 10 mL

## 2018-08-01 MED ORDER — SODIUM CHLORIDE 0.9% FLUSH: 10.0000 mL | INTRAVENOUS | Status: DC | PRN

## 2018-08-01 MED ORDER — POTASSIUM CHLORIDE CRYS ER 20 MEQ PO TBCR
20.0000 meq | EXTENDED_RELEASE_TABLET | Freq: Every day | ORAL | Status: DC
  Administered 2018-08-02: 20 meq via ORAL
  Filled 2018-08-01: qty 1

## 2018-08-01 MED ORDER — DIGOXIN 125 MCG PO TABS
0.1250 mg | ORAL_TABLET | Freq: Every day | ORAL | Status: DC
  Administered 2018-08-01 – 2018-08-04 (×4): 0.125 mg via ORAL
  Filled 2018-08-01 (×4): qty 1

## 2018-08-01 MED ORDER — SACUBITRIL-VALSARTAN 49-51 MG PO TABS
1.0000 | ORAL_TABLET | Freq: Two times a day (BID) | ORAL | Status: DC
  Administered 2018-08-01 – 2018-08-07 (×12): 1 via ORAL
  Filled 2018-08-01 (×12): qty 1

## 2018-08-01 NOTE — Progress Notes (Signed)
Pt transferred to 4E-26 via stretcher from ED with RN. Pt transferred to bed. Pt given CHG bath. Tele applied, CCMD notified. Pt oriented to call light, room and bed. Call light within reach. Will continue to monitor.  Amanda Cockayne, RN

## 2018-08-01 NOTE — ED Notes (Signed)
ED Provider at bedside. 

## 2018-08-01 NOTE — Progress Notes (Signed)
Troponin noted to be 0.06 on admission labs; admitted by CHF team for significant volume overload. Per consult note, no acute s/sx of ischemia. Per d/w nurse, patient currently without CP. Suspect due to CHF + CKD; will order troponins to trend. Continue ASA. Dayna Dunn PA-C
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
## 2018-08-01 NOTE — ED Notes (Signed)
Pt given sandwich and water, ok by EDP

## 2018-08-01 NOTE — ED Notes (Signed)
Medtronic report:  Optivol: recent measurement indicative of HF and high fluid. Elevated and trending.  Dec 26th non-sustained vent. high rate of 185 bpm that lasted 2 secs.
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
## 2018-08-01 NOTE — Progress Notes (Signed)
Peripherally Inserted Central Catheter/Midline Placement  The IV Nurse has discussed with the patient and/or persons authorized to consent for the patient, the purpose of this procedure and the potential benefits and risks involved with this procedure.  The benefits include less needle sticks, lab draws from the catheter, and the patient may be discharged home with the catheter. Risks include, but not limited to, infection, bleeding, blood clot (thrombus formation), and puncture of an artery; nerve damage and irregular heartbeat and possibility to perform a PICC exchange if needed/ordered by physician.  Alternatives to this procedure were also discussed.  Bard Power PICC patient education guide, fact sheet on infection prevention and patient information card has been provided to patient /or left at bedside.    PICC/Midline Placement Documentation  PICC Double Lumen 08/01/18 PICC Right Brachial 41 cm 3 cm (Active)  Indication for Insertion or Continuance of Line Chronic illness with exacerbations (CF, Sickle Cell, etc.) 08/01/2018  8:58 PM  Exposed Catheter (cm) 3 cm 08/01/2018  8:58 PM  Site Assessment Clean;Dry;Intact 08/01/2018  8:58 PM  Lumen #1 Status Flushed;Saline locked;Blood return noted 08/01/2018  8:58 PM  Lumen #2 Status Flushed;Saline locked;Blood return noted 08/01/2018  8:58 PM  Dressing Type Transparent 08/01/2018  8:58 PM  Dressing Status Clean;Dry;Intact 08/01/2018  8:58 PM  Dressing Intervention New dressing 08/01/2018  8:58 PM  Dressing Change Due 08/08/18 08/01/2018  8:58 PM       Akita Maxim, Nicolette Bang 08/01/2018, 8:58 PM

## 2018-08-01 NOTE — Progress Notes (Signed)
CRITICAL VALUE ALERT  Critical Value:  Troponin 0.06  Date & Time Notied:  08/01/2018 1701  Provider Notified: Idolina Primer  Orders Received/Actions taken: Awaiting orders.

## 2018-08-01 NOTE — ED Triage Notes (Signed)
Pt reports swelling in his legs, abdomen, and neck. Pt states he has had a weight gain of approximately 50lbs over the last several weeks. Pt denies shortness of breath or chest pain.

## 2018-08-01 NOTE — Progress Notes (Signed)
Left lower extremity venous duplex has been completed. Negative for DVT. Results were given to Suella Broad PA.  08/01/18 1:09 PM Carlos Levering RVT

## 2018-08-01 NOTE — Progress Notes (Signed)
Inpatient Diabetes Program Recommendations  AACE/ADA: New Consensus Statement on Inpatient Glycemic Control (2015)  Target Ranges:  Prepandial:   less than 140 mg/dL      Peak postprandial:   less than 180 mg/dL (1-2 hours)      Critically ill patients:  140 - 180 mg/dL   Lab Results  Component Value Date   GLUCAP 228 (H) 01/14/2018   HGBA1C 9.1 (H) 01/07/2018    Review of Glycemic Control Results for Marcus Johnson, Marcus Johnson (MRN [id number]) as of 08/01/2018 16:05  Ref. Range 08/01/2018 12:51  Glucose Latest Ref Range: 70 - 99 mg/dL 144 (H)   Diabetes history: DM 2 Outpatient Diabetes medications:  Lantus 60 units q HS, Novolog 5 units tid with meals Current orders for Inpatient glycemic control:  Lantus 30 units q HS, Novolog 6 units tid with meals, Novolog resistant tid with meals and HS  Inpatient Diabetes Program Recommendations:    Referral received.  Agree with ordered Lantus dose (this is 1/2 of home dose).  Consider reducing Novolog meal coverage to home dose of 5 units tid with meals.  Also consider reducing Novolog correction to tid with meals and HS.    Thanks,  Adah Perl, RN, BC-ADM Inpatient Diabetes Coordinator Pager [phone] (8a-5p)

## 2018-08-01 NOTE — Telephone Encounter (Signed)
Pt notified Verbalizes understanding 

## 2018-08-01 NOTE — Telephone Encounter (Signed)
Should go to ED, it sounds like he would likely need admission.    Legrand Como [address]" [place], PA-C 08/01/2018 9:25 AM

## 2018-08-01 NOTE — Telephone Encounter (Signed)
Pt called to report that he has gained 25-30 lbs in the last 2-3 weeks. Pt states he has edema in his neck, abdomen, and legs. No other symptoms were reported. Pt is currently taking torsemide 20 mg (TAKE 5 TABLETS BY MOUTH TWICE A DAY). Pt has cancelled last 2 appts with CHF clinic. Should I refer him to ED? Please advise.

## 2018-08-02 LAB — GLUCOSE, CAPILLARY
GLUCOSE-CAPILLARY: 111 mg/dL — AB (ref 70–99)
Glucose-Capillary: 62 mg/dL — ABNORMAL LOW (ref 70–99)
Glucose-Capillary: 74 mg/dL (ref 70–99)
Glucose-Capillary: 73 mg/dL (ref 70–99)
Glucose-Capillary: 89 mg/dL (ref 70–99)
Glucose-Capillary: 65 mg/dL — ABNORMAL LOW (ref 70–99)
Glucose-Capillary: 112 mg/dL — ABNORMAL HIGH (ref 70–99)

## 2018-08-02 LAB — BASIC METABOLIC PANEL
Anion gap: 11 (ref 5–15)
BUN: 31 mg/dL — ABNORMAL HIGH (ref 6–20)
CO2: 27 mmol/L (ref 22–32)
CREATININE: 1.41 mg/dL — AB (ref 0.61–1.24)
Calcium: 8.6 mg/dL — ABNORMAL LOW (ref 8.9–10.3)
Chloride: 106 mmol/L (ref 98–111)
GFR calc Af Amer: >60 mL/min (ref >60)
GFR calc non Af Amer: 55 mL/min — ABNORMAL LOW (ref >60)
Glucose, Bld: 80 mg/dL (ref 70–99)
Potassium: 3.4 mmol/L — ABNORMAL LOW (ref 3.5–5.1)
SODIUM: 144 mmol/L (ref 135–145)

## 2018-08-02 LAB — COOXEMETRY PANEL
Carboxyhemoglobin: 2 % — ABNORMAL HIGH (ref 0.5–1.5)
Methemoglobin: 1.3 % (ref 0.0–1.5)
O2 Saturation: 75.5 %
Total hemoglobin: 11.5 g/dL — ABNORMAL LOW (ref 12.0–16.0)

## 2018-08-02 LAB — CBC
HEMATOCRIT: 36.4 % — AB (ref 39.0–52.0)
Hemoglobin: 10.9 g/dL — ABNORMAL LOW (ref 13.0–17.0)
MCH: 26.1 pg (ref 26.0–34.0)
MCHC: 29.9 g/dL — ABNORMAL LOW (ref 30.0–36.0)
MCV: 87.1 fL (ref 80.0–100.0)
Platelets: 221 10*3/uL (ref 150–400)
RBC: 4.18 MIL/uL — ABNORMAL LOW (ref 4.22–5.81)
RDW: 15.3 % (ref 11.5–15.5)
WBC: 7.4 10*3/uL (ref 4.0–10.5)
nRBC: 0 % (ref 0.0–0.2)

## 2018-08-02 LAB — TROPONIN I
Troponin I: 0.06 ng/mL (ref <0.03)
Troponin I: 0.06 ng/mL (ref <0.03)

## 2018-08-02 MED ORDER — POTASSIUM CHLORIDE CRYS ER 20 MEQ PO TBCR
40.0000 meq | EXTENDED_RELEASE_TABLET | Freq: Every day | ORAL | Status: DC
  Administered 2018-08-03: 40 meq via ORAL
  Filled 2018-08-02: qty 2

## 2018-08-02 MED ORDER — METOLAZONE 5 MG PO TABS
5.0000 mg | ORAL_TABLET | Freq: Every day | ORAL | Status: DC
  Administered 2018-08-02 – 2018-08-04 (×3): 5 mg via ORAL
  Filled 2018-08-02 (×3): qty 1

## 2018-08-02 MED ORDER — POTASSIUM CHLORIDE CRYS ER 20 MEQ PO TBCR
40.0000 meq | EXTENDED_RELEASE_TABLET | Freq: Once | ORAL | Status: AC
  Administered 2018-08-02: 40 meq via ORAL
  Filled 2018-08-02: qty 2

## 2018-08-02 NOTE — Progress Notes (Signed)
Hypoglycemic Event  CBG: 62  Treatment: 4 oz juice/soda  Symptoms: None  Follow-up CBG: [id number] CBG Result: 74  Possible Reasons for Event: Unknown. Medication regimen possibly.    Comments/MD notified:Folk Hassell Done MD Notified awaiting orders   Will continue to monitor    Marcus Johnson

## 2018-08-02 NOTE — Progress Notes (Signed)
PICC line in place. CVP started. Pt CVP trending between 27-33. No baseline to compare to. Zero and rechecked by two RN's with no change in readings. Will continue to monitor.

## 2018-08-02 NOTE — Progress Notes (Signed)
Advanced Heart Failure Rounding Note   Subjective:    Admitted yesterday with massive volume overload and LE wounds.   Started on IV lasix. Modest urine output. Weight down 2 pounds. Breathing ok. Still swollen.  Denies orthopnea or PND. No CP.   PICC placed overnight. Co-ox 76%. CVP ~24. Trop flat at 0.06   Objective:   Weight Range:  Vital Signs:   Temp:  [97.5 F (36.4 C)-98.6 F (37 C)] 97.5 F (36.4 C) (12/28 1114) Pulse Rate:  [85-97] 91 (12/28 1114) Resp:  [12-24] 20 (12/28 1114) BP: (113-141)/(71-98) 125/88 (12/28 1114) SpO2:  [98 %-100 %] 99 % (12/28 1114) Weight:  [106.9 kg-107.9 kg] 106.9 kg (12/28 0500) Last BM Date: 07/31/18  Weight change: Filed Weights   08/01/18 1204 08/01/18 1303 08/02/18 0500  Weight: 108 kg 107.9 kg 106.9 kg    Intake/Output:   Intake/Output Summary (Last 24 hours) at 08/02/2018 1245 Last data filed at 08/02/2018 1200 Gross per 24 hour  Intake 420 ml  Output 2400 ml  Net -1980 ml     Physical Exam: General:  Sitting in chair  No resp difficulty HEENT: normal Neck: supple. JVP to ear. Carotids 2+ bilat; no bruits. No lymphadenopathy or thryomegaly appreciated. Cor: PMI nondisplaced. Regular rate & rhythm. No rubs, gallops or murmurs. Lungs: clear Abdomen: soft, nontender, + distended. No hepatosplenomegaly. No bruits or masses. Good bowel sounds. Extremities: no cyanosis, clubbing, rash, 3-4+ edema LLE bbot Neuro: alert & orientedx3, cranial nerves grossly intact. moves all 4 extremities w/o difficulty. Affect pleasant   Telemetry: Sinus 90s. Personally reviewed   Labs: Basic Metabolic Panel: Recent Labs  Lab 08/01/18 1251 08/01/18 1543 08/02/18 0511  NA 142  --  144  K 4.7  --  3.4*  CL 108  --  106  CO2 26  --  27  GLUCOSE 144*  --  80  BUN 37*  --  31*  CREATININE 1.43* 1.50* 1.41*  CALCIUM 8.8*  --  8.6*  MG  --  2.2  --     Liver Function Tests: No results for input(s): AST, ALT, ALKPHOS,  BILITOT, PROT, ALBUMIN in the last 168 hours. No results for input(s): LIPASE, AMYLASE in the last 168 hours. No results for input(s): AMMONIA in the last 168 hours.  CBC: Recent Labs  Lab 08/01/18 1251 08/01/18 1543 08/02/18 0511  WBC 8.3 8.1 7.4  NEUTROABS 5.6  --   --   HGB 11.5* 11.7* 10.9*  HCT 37.7* 39.3 36.4*  MCV 87.1 88.1 87.1  PLT 245 234 221    Cardiac Enzymes: Recent Labs  Lab 08/01/18 1543 08/01/18 1801 08/02/18 0155 08/02/18 0511  TROPONINI 0.06* 0.06* 0.06* 0.06*    BNP: BNP (last 3 results) Recent Labs    01/07/18 1530 01/21/18 1403 08/01/18 1259  BNP 1,784.5* 992.3* 1,083.3*    ProBNP (last 3 results) No results for input(s): PROBNP in the last 8760 hours.    Other results:  Imaging: Dg Chest Port 1 View  Result Date: 08/01/2018 CLINICAL DATA:  Per patient fluid build up EXAM: PORTABLE CHEST 1 VIEW COMPARISON:  01/07/2018 FINDINGS: Patient has LEFT-sided cardiac device, leads to the RIGHT atrium and RIGHT ventricle. The heart is mildly enlarged. Status post median sternotomy and CABG. There is focal patchy density in the MEDIAL LEFT lung base consistent with atelectasis or infiltrate and new since previous exam. There is no evidence for pulmonary edema. IMPRESSION: LEFT LOWER lobe atelectasis or infiltrate. Followup  PA and lateral chest X-ray is recommended in 3-4 weeks following trial of antibiotic therapy to ensure resolution and exclude underlying malignancy. Cardiomegaly without pulmonary edema. Electronically Signed   By: Nolon Nations M.D.   On: 08/01/2018 13:24   Vas [place] Lower Extremity Venous (dvt) (mc And Wl 7a-7p)  Result Date: 08/01/2018  Lower Venous Study Indications: Swelling.  Limitations: Poor ultrasound/tissue interface. Performing Technologist: Oliver Hum RVT  Examination Guidelines: A complete evaluation includes B-mode imaging, spectral Doppler, color Doppler, and power Doppler as needed of all accessible portions of  each vessel. Bilateral testing is considered an integral part of a complete examination. Limited examinations for reoccurring indications may be performed as noted.  Right Venous Findings: +---+---------------+---------+-----------+----------+-------+    CompressibilityPhasicitySpontaneityPropertiesSummary +---+---------------+---------+-----------+----------+-------+ CFVFull           Yes      Yes                          +---+---------------+---------+-----------+----------+-------+  Left Venous Findings: +---------+---------------+---------+-----------+----------+-------+          CompressibilityPhasicitySpontaneityPropertiesSummary +---------+---------------+---------+-----------+----------+-------+ CFV      Full           Yes      Yes                          +---------+---------------+---------+-----------+----------+-------+ SFJ      Full                                                 +---------+---------------+---------+-----------+----------+-------+ FV Prox  Full                                                 +---------+---------------+---------+-----------+----------+-------+ FV Mid   Full                                                 +---------+---------------+---------+-----------+----------+-------+ FV DistalFull                                                 +---------+---------------+---------+-----------+----------+-------+ PFV      Full                                                 +---------+---------------+---------+-----------+----------+-------+ POP      Full           Yes      Yes                          +---------+---------------+---------+-----------+----------+-------+ PTV      Full                                                 +---------+---------------+---------+-----------+----------+-------+  PERO     Full                                                  +---------+---------------+---------+-----------+----------+-------+    Summary: Right: No evidence of common femoral vein obstruction. Left: There is no evidence of deep vein thrombosis in the lower extremity. However, portions of this examination were limited- see technologist comments above. No cystic structure found in the popliteal fossa.  *See table(s) above for measurements and observations. Electronically signed by Curt Jews MD on 08/01/2018 at 2:29:00 PM.    Final    [place] Ekg Site Rite  Result Date: 08/01/2018 If Site Rite image not attached, placement could not be confirmed due to current cardiac rhythm.     Medications:     Scheduled Medications: . allopurinol  600 mg Oral Daily  . aspirin EC  81 mg Oral Daily  . digoxin  0.125 mg Oral Daily  . enoxaparin (LOVENOX) injection  40 mg Subcutaneous Q24H  . furosemide  80 mg Intravenous TID  . insulin aspart  0-20 Units Subcutaneous TID WC  . insulin aspart  0-5 Units Subcutaneous QHS  . insulin aspart  5 Units Subcutaneous TID WC  . insulin glargine  30 Units Subcutaneous QHS  . potassium chloride  20 mEq Oral Daily  . rosuvastatin  40 mg Oral q1800  . sacubitril-valsartan  1 tablet Oral BID  . sodium chloride flush  10-40 mL Intracatheter Q12H  . spironolactone  25 mg Oral Daily     Infusions: . sodium chloride       PRN Medications:  sodium chloride, acetaminophen, ondansetron (ZOFRAN) IV, sodium chloride flush   Assessment:   Marcus Johnson is a13 y.o.male with h/o DM2, CAD s/p CABG 9528, systolic HF due to ischemic cardiomyopathy with EF 20-25% (echo 12/15), DM2 and CKD. He is s/p Medtronic single chamber ICD.  Presented to [facility] on 12/27 with 30+ lb weight gain.    Plan/Discussion:     1. Acute on chronic systolic HF: ICM.Has Medtronic ICD. Has required milrinone multiple times in the past for low output HF. Offered advanced therapies, but declined.  - Echo 6/19 with EF 20% and moderately decreased RV  systolic function.  - CPX 4/19 with severe functional limitation due to HF.  - RHC 6/19 showed low output and coronary angiography showed no interventional targets.  - Volume elevated. Weight up over 40 lbs since we last saw him in clinic in July - On lasix 80 mg IV TID currently. Weight down 2 pounds overnight. Will continue and start daily metolazone 5mg  daily - Co-ox 76% today. So no inotropes currently - Continue Entresto 49/51 mg BID - Continue spironolactone 25 mg daily.  - Place TED hose -Intolerant ivabradine due to nausea. - Hold BB with acute decompensation - Continue digoxin 0.125 mg daily. Dig level <0.2 today. - He is unsure if he would want home milrinone if he needed/offered. He remains uninterested in LVAD or transplant.    2. CAD s/p CABG 2002:  - Last cath in 6/19 with patent grafts. - continue ASA, statin  - No CP. Troponin flat at 0.06 - due to HF  3. HTN: - SBP 120-130. Stable   4. CKD: Stage 3. - Baseline creatinine 1.3-1.5 - Creatinine stable at 1.41 today. Monitor closely with diuresis.   5.  Left foot wound - CT left foot 07/07/18: suspicion for early osteomyelitis involving the base of the 5th metatarsal - Followed by wound clinic. Last saw today and was referred to see ID. - Check sed rate.  - Consult wound care    Length of Stay: 1   Daniel Bensimhon 08/02/2018, 12:45 PM  Advanced Heart Failure Team Pager [phone] (M-F; 7a - 4p)  Please contact [facility] for night-coverage after hours (4p -7a ) and weekends on [email or web address]

## 2018-08-03 LAB — COOXEMETRY PANEL
Carboxyhemoglobin: 1.7 % — ABNORMAL HIGH (ref 0.5–1.5)
Methemoglobin: 1.6 % — ABNORMAL HIGH (ref 0.0–1.5)
O2 Saturation: 48.8 %
Total hemoglobin: 12 g/dL (ref 12.0–16.0)

## 2018-08-03 LAB — GLUCOSE, CAPILLARY
Glucose-Capillary: 72 mg/dL (ref 70–99)
Glucose-Capillary: 160 mg/dL — ABNORMAL HIGH (ref 70–99)
Glucose-Capillary: 69 mg/dL — ABNORMAL LOW (ref 70–99)
Glucose-Capillary: 82 mg/dL (ref 70–99)
Glucose-Capillary: 130 mg/dL — ABNORMAL HIGH (ref 70–99)

## 2018-08-03 LAB — CBC
HEMATOCRIT: 38.6 % — AB (ref 39.0–52.0)
Hemoglobin: 11.5 g/dL — ABNORMAL LOW (ref 13.0–17.0)
MCH: 25.9 pg — ABNORMAL LOW (ref 26.0–34.0)
MCHC: 29.8 g/dL — ABNORMAL LOW (ref 30.0–36.0)
MCV: 86.9 fL (ref 80.0–100.0)
Platelets: 250 10*3/uL (ref 150–400)
RBC: 4.44 MIL/uL (ref 4.22–5.81)
RDW: 15.1 % (ref 11.5–15.5)
WBC: 8.2 10*3/uL (ref 4.0–10.5)
nRBC: 0 % (ref 0.0–0.2)

## 2018-08-03 LAB — BASIC METABOLIC PANEL
Anion gap: 8 (ref 5–15)
BUN: 31 mg/dL — ABNORMAL HIGH (ref 6–20)
CO2: 31 mmol/L (ref 22–32)
Calcium: 8.9 mg/dL (ref 8.9–10.3)
Chloride: 104 mmol/L (ref 98–111)
Creatinine, Ser: 1.34 mg/dL — ABNORMAL HIGH (ref 0.61–1.24)
GFR calc Af Amer: >60 mL/min (ref >60)
GFR calc non Af Amer: 58 mL/min — ABNORMAL LOW (ref >60)
Glucose, Bld: 84 mg/dL (ref 70–99)
Potassium: 3.7 mmol/L (ref 3.5–5.1)
SODIUM: 143 mmol/L (ref 135–145)

## 2018-08-03 LAB — MAGNESIUM: Magnesium: 2.4 mg/dL (ref 1.7–2.4)

## 2018-08-03 MED ORDER — POTASSIUM CHLORIDE CRYS ER 20 MEQ PO TBCR
40.0000 meq | EXTENDED_RELEASE_TABLET | Freq: Two times a day (BID) | ORAL | Status: DC
  Administered 2018-08-03 – 2018-08-06 (×6): 40 meq via ORAL
  Filled 2018-08-03 (×6): qty 2

## 2018-08-03 NOTE — Progress Notes (Addendum)
Advanced Heart Failure Rounding Note   Subjective:    Admitted 12/27 with massive volume overload and LE wounds.   PICC placed. Initial co-ox 76% -> 49% today.  Remains on IV lasix. Weight down 9 pounds. Creatinine 1.4 -> 1.3   Feeling much better but still feels bloated. No orthopnea, PND or CP. Seen by [facility] nurse and dressing changed.   CVP 18  Objective:   Weight Range:  Vital Signs:   Temp:  [97.5 F (36.4 C)-98.3 F (36.8 C)] 97.8 F (36.6 C) (12/29 0812) Pulse Rate:  [89-103] 89 (12/29 1013) Resp:  [15-20] 15 (12/29 0812) BP: (93-146)/(62-88) 93/62 (12/29 0812) SpO2:  [92 %-100 %] 99 % (12/29 0812) Weight:  [102 kg] 102 kg (12/29 0619) Last BM Date: 08/01/18  Weight change: Filed Weights   08/01/18 1303 08/02/18 0500 08/03/18 0619  Weight: 107.9 kg 106.9 kg 102 kg    Intake/Output:   Intake/Output Summary (Last 24 hours) at 08/03/2018 1048 Last data filed at 08/03/2018 1028 Gross per 24 hour  Intake 590 ml  Output 6675 ml  Net -6085 ml     Physical Exam: General:  Sitting up on side of bed. No resp difficulty HEENT: normal Neck: supple. JVP to ear . Carotids 2+ bilat; no bruits. No lymphadenopathy or thryomegaly appreciated. Cor: PMI nondisplaced. Regular rate & rhythm. 2/6 MR Lungs: clear Abdomen: soft, nontender, + distended. No hepatosplenomegaly. No bruits or masses. Good bowel sounds. Extremities: no cyanosis, clubbing, rash, 3+ edema dressing on LLE Neuro: alert & orientedx3, cranial nerves grossly intact. moves all 4 extremities w/o difficulty. Affect pleasant    Telemetry: Sinus 90s. Personally reviewed   Labs: Basic Metabolic Panel: Recent Labs  Lab 08/01/18 1251 08/01/18 1543 08/02/18 0511 08/03/18 0433  NA 142  --  144 143  K 4.7  --  3.4* 3.7  CL 108  --  106 104  CO2 26  --  27 31  GLUCOSE 144*  --  80 84  BUN 37*  --  31* 31*  CREATININE 1.43* 1.50* 1.41* 1.34*  CALCIUM 8.8*  --  8.6* 8.9  MG  --  2.2  --  2.4     Liver Function Tests: No results for input(s): AST, ALT, ALKPHOS, BILITOT, PROT, ALBUMIN in the last 168 hours. No results for input(s): LIPASE, AMYLASE in the last 168 hours. No results for input(s): AMMONIA in the last 168 hours.  CBC: Recent Labs  Lab 08/01/18 1251 08/01/18 1543 08/02/18 0511 08/03/18 0433  WBC 8.3 8.1 7.4 8.2  NEUTROABS 5.6  --   --   --   HGB 11.5* 11.7* 10.9* 11.5*  HCT 37.7* 39.3 36.4* 38.6*  MCV 87.1 88.1 87.1 86.9  PLT 245 234 221 250    Cardiac Enzymes: Recent Labs  Lab 08/01/18 1543 08/01/18 1801 08/02/18 0155 08/02/18 0511  TROPONINI 0.06* 0.06* 0.06* 0.06*    BNP: BNP (last 3 results) Recent Labs    01/07/18 1530 01/21/18 1403 08/01/18 1259  BNP 1,784.5* 992.3* 1,083.3*    ProBNP (last 3 results) No results for input(s): PROBNP in the last 8760 hours.    Other results:  Imaging: Dg Chest Port 1 View  Result Date: 08/01/2018 CLINICAL DATA:  Per patient fluid build up EXAM: PORTABLE CHEST 1 VIEW COMPARISON:  01/07/2018 FINDINGS: Patient has LEFT-sided cardiac device, leads to the RIGHT atrium and RIGHT ventricle. The heart is mildly enlarged. Status post median sternotomy and CABG. There is focal patchy  density in the MEDIAL LEFT lung base consistent with atelectasis or infiltrate and new since previous exam. There is no evidence for pulmonary edema. IMPRESSION: LEFT LOWER lobe atelectasis or infiltrate. Followup PA and lateral chest X-ray is recommended in 3-4 weeks following trial of antibiotic therapy to ensure resolution and exclude underlying malignancy. Cardiomegaly without pulmonary edema. Electronically Signed   By: Nolon Nations M.D.   On: 08/01/2018 13:24   Vas [place] Lower Extremity Venous (dvt) (mc And Wl 7a-7p)  Result Date: 08/01/2018  Lower Venous Study Indications: Swelling.  Limitations: Poor ultrasound/tissue interface. Performing Technologist: Oliver Hum RVT  Examination Guidelines: A complete evaluation  includes B-mode imaging, spectral Doppler, color Doppler, and power Doppler as needed of all accessible portions of each vessel. Bilateral testing is considered an integral part of a complete examination. Limited examinations for reoccurring indications may be performed as noted.  Right Venous Findings: +---+---------------+---------+-----------+----------+-------+    CompressibilityPhasicitySpontaneityPropertiesSummary +---+---------------+---------+-----------+----------+-------+ CFVFull           Yes      Yes                          +---+---------------+---------+-----------+----------+-------+  Left Venous Findings: +---------+---------------+---------+-----------+----------+-------+          CompressibilityPhasicitySpontaneityPropertiesSummary +---------+---------------+---------+-----------+----------+-------+ CFV      Full           Yes      Yes                          +---------+---------------+---------+-----------+----------+-------+ SFJ      Full                                                 +---------+---------------+---------+-----------+----------+-------+ FV Prox  Full                                                 +---------+---------------+---------+-----------+----------+-------+ FV Mid   Full                                                 +---------+---------------+---------+-----------+----------+-------+ FV DistalFull                                                 +---------+---------------+---------+-----------+----------+-------+ PFV      Full                                                 +---------+---------------+---------+-----------+----------+-------+ POP      Full           Yes      Yes                          +---------+---------------+---------+-----------+----------+-------+ PTV      Full                                                  +---------+---------------+---------+-----------+----------+-------+  PERO     Full                                                 +---------+---------------+---------+-----------+----------+-------+    Summary: Right: No evidence of common femoral vein obstruction. Left: There is no evidence of deep vein thrombosis in the lower extremity. However, portions of this examination were limited- see technologist comments above. No cystic structure found in the popliteal fossa.  *See table(s) above for measurements and observations. Electronically signed by Curt Jews MD on 08/01/2018 at 2:29:00 PM.    Final    [place] Ekg Site Rite  Result Date: 08/01/2018 If Site Rite image not attached, placement could not be confirmed due to current cardiac rhythm.    Medications:     Scheduled Medications: . allopurinol  600 mg Oral Daily  . aspirin EC  81 mg Oral Daily  . digoxin  0.125 mg Oral Daily  . enoxaparin (LOVENOX) injection  40 mg Subcutaneous Q24H  . furosemide  80 mg Intravenous TID  . insulin aspart  0-20 Units Subcutaneous TID WC  . insulin aspart  0-5 Units Subcutaneous QHS  . insulin aspart  5 Units Subcutaneous TID WC  . insulin glargine  30 Units Subcutaneous QHS  . metolazone  5 mg Oral Daily  . potassium chloride  40 mEq Oral Daily  . rosuvastatin  40 mg Oral q1800  . sacubitril-valsartan  1 tablet Oral BID  . sodium chloride flush  10-40 mL Intracatheter Q12H  . spironolactone  25 mg Oral Daily    Infusions: . sodium chloride      PRN Medications: sodium chloride, acetaminophen, ondansetron (ZOFRAN) IV, sodium chloride flush   Assessment:   Marcus Johnson is a77 y.o.male with h/o DM2, CAD s/p CABG 7412, systolic HF due to ischemic cardiomyopathy with EF 20-25% (echo 12/15), DM2 and CKD. He is s/p Medtronic single chamber ICD.  Presented to [facility] on 12/27 with 30+ lb weight gain.    Plan/Discussion:     1. Acute on chronic systolic HF: ICM.Has Medtronic ICD. Has  required milrinone multiple times in the past for low output HF. Offered advanced therapies, but declined.  - Echo 6/19 with EF 20% and moderately decreased RV systolic function.  - CPX 4/19 with severe functional limitation due to HF.  - RHC 6/19 showed low output and coronary angiography showed no interventional targets.  - Weight up over 40 lbs since we last saw him in clinic in July - On lasix 80 mg IV TID + metolazone 76m daily. Weigh down 9 pounds overnight. CVP still 18 with marked volume overload. Will continue current diuretics. - Co-ox 76%-> 49% but continues to diurese well and renal function stable. Will not start inotropes yet. Continue to follow closely. - Continue Entresto 49/51 mg BID - Continue spironolactone 25 mg daily.  - Place TED hose -Intolerant ivabradine due to nausea. - Hold BB with acute decompensation - Continue digoxin 0.125 mg daily. Dig level <0.2 today. - He is unsure if he would want home milrinone if he needed/offered. He remains uninterested in LVAD or transplant.    2. CAD s/p CABG 2002:  - Last cath in 6/19 with patent grafts. - continue ASA, statin  - no CP Troponin flat at 0.06 - due to HF  3. HTN: - BP soft today. Follow closely  with diuresis   4. CKD: Stage 3. - Baseline creatinine 1.3-1.5 - Creatinine stable at 1.34  today. Monitor closely with diuresis.   5. Left foot wound - CT left foot 07/07/18: suspicion for early osteomyelitis involving the base of the 5th metatarsal - Followed by wound clinic. ESR 22 - WOC has seen today and dressing changed  6. Hypokalemia - K 3.7. will supp  Length of Stay: 2   Glori Bickers MD 08/03/2018, 10:48 AM  Advanced Heart Failure Team Pager [phone] (M-F; [place])  Please contact [facility] for night-coverage after hours (4p -7a ) and weekends on [email or web address]

## 2018-08-03 NOTE — Progress Notes (Signed)
Hypoglycemic Event  CBG: 65  Treatment: 8 oz juice/soda  Symptoms: None  Follow-up CBG: Time:2147 CBG Result:112  Possible Reasons for Event: Medication regimen:   Comments/MD notified:on call Cardiology  HS time latus held.     Marcus Johnson

## 2018-08-03 NOTE — Progress Notes (Signed)
Inpatient Diabetes Program Recommendations  AACE/ADA: New Consensus Statement on Inpatient Glycemic Control (2015)  Target Ranges:  Prepandial:   less than 140 mg/dL      Peak postprandial:   less than 180 mg/dL (1-2 hours)      Critically ill patients:  140 - 180 mg/dL   Lab Results  Component Value Date   GLUCAP 160 (H) 08/03/2018   HGBA1C 9.1 (H) 01/07/2018    Review of Glycemic Control  Blood sugars past 24H - 72-160 mg/dL. Eating 100%. Had hypoglycemia on 12/28 am.    Inpatient Diabetes Program Recommendations:     Decrease Novolog to 0-15 units tidwc and hs Decrease Lantus to 15 units QHS Add CHO mod med to 2 gram Na+ heart healthy diet Continue Novolog 5 units tidwc for meal coverage insulin. Need updated HgbA1C to assess glycemic control PTA.  Will continue to follow.  Thank you. Lorenda Peck, RD, LDN, CDE Inpatient Diabetes Coordinator [phone]

## 2018-08-03 NOTE — Progress Notes (Signed)
Ted hose order sent to materials management. Awaiting delivery. Will continue to monitor.  Amanda Cockayne, RN

## 2018-08-03 NOTE — Progress Notes (Signed)
Hypoglycemic Event  CBG: 69  Treatment: orange juice and peanut butter  Symptoms: none  Follow-up CBG: Time:1658 CBG Result:82  Possible Reasons for Event: medication regimen  Comments/MD notified: will continue to monitor     Marcus Johnson Marcus Johnson

## 2018-08-03 NOTE — Consult Note (Signed)
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 Patient is seen weekly on Friday's by Dr. Laurene Footman at the outpatient [facility] and will resume care there following discharge. Wound type: Neuropathic Pressure Injury POA: N/A Measurement: 0.5cm x 0.4cm x 0.4cm Wound bed: Pale pink, moist Drainage (amount, consistency, odor) small to moderate amount of light yellow exudate on old dressing Periwound: With periwound callus and bony deformity measuring 0.5cm circumferentially Dressing procedure/placement/frequency: I will implement a daily dressing change using silver hydrofiber as an antimicrobial topical agent after cleansing with soap and water and rinsing with NS.  Elevation will be a component of in house care and I have provided Nursing with guidance for this.   [place] nursing team will not follow, but will remain available to this patient, the nursing and medical teams.  Please re-consult if needed. Thanks, Maudie Flakes, MSN, RN, [place], Arther Abbott  Pager# [phone]

## 2018-08-03 NOTE — Progress Notes (Signed)
Pt requested 800 mg ibuprofen for left foot pain as tylenol was not helping. When on call cardiology called back, MD stated NSAIDS is not appropriate for the pt and call back if he needs something stronger, he will give pt some oxy. Pt stated oxy/percocet makes him sick. Pt stated he will be fine without for now. Will continue to monitor.

## 2018-08-04 ENCOUNTER — Inpatient Hospital Stay (HOSPITAL_BASED_OUTPATIENT_CLINIC_OR_DEPARTMENT_OTHER): Payer: BLUE CROSS/BLUE SHIELD

## 2018-08-04 ENCOUNTER — Other Ambulatory Visit: Payer: Self-pay

## 2018-08-04 ENCOUNTER — Inpatient Hospital Stay (HOSPITAL_COMMUNITY): Payer: BLUE CROSS/BLUE SHIELD

## 2018-08-04 DIAGNOSIS — I361 Nonrheumatic tricuspid (valve) insufficiency: Secondary | ICD-10-CM

## 2018-08-04 DIAGNOSIS — I5043 Acute on chronic combined systolic (congestive) and diastolic (congestive) heart failure: Secondary | ICD-10-CM

## 2018-08-04 DIAGNOSIS — E1122 Type 2 diabetes mellitus with diabetic chronic kidney disease: Secondary | ICD-10-CM

## 2018-08-04 DIAGNOSIS — I739 Peripheral vascular disease, unspecified: Secondary | ICD-10-CM

## 2018-08-04 DIAGNOSIS — B9561 Methicillin susceptible Staphylococcus aureus infection as the cause of diseases classified elsewhere: Secondary | ICD-10-CM

## 2018-08-04 DIAGNOSIS — Z888 Allergy status to other drugs, medicaments and biological substances status: Secondary | ICD-10-CM

## 2018-08-04 DIAGNOSIS — Z951 Presence of aortocoronary bypass graft: Secondary | ICD-10-CM

## 2018-08-04 DIAGNOSIS — E1169 Type 2 diabetes mellitus with other specified complication: Secondary | ICD-10-CM

## 2018-08-04 DIAGNOSIS — N189 Chronic kidney disease, unspecified: Secondary | ICD-10-CM

## 2018-08-04 DIAGNOSIS — I255 Ischemic cardiomyopathy: Secondary | ICD-10-CM

## 2018-08-04 DIAGNOSIS — M86672 Other chronic osteomyelitis, left ankle and foot: Secondary | ICD-10-CM

## 2018-08-04 DIAGNOSIS — Z95810 Presence of automatic (implantable) cardiac defibrillator: Secondary | ICD-10-CM

## 2018-08-04 DIAGNOSIS — K5900 Constipation, unspecified: Secondary | ICD-10-CM

## 2018-08-04 LAB — COOXEMETRY PANEL
Carboxyhemoglobin: 1.8 % — ABNORMAL HIGH (ref 0.5–1.5)
Methemoglobin: 1.3 % (ref 0.0–1.5)
O2 Saturation: 57.7 %
Total hemoglobin: 12.4 g/dL (ref 12.0–16.0)

## 2018-08-04 LAB — CBC
HCT: 39.5 % (ref 39.0–52.0)
Hemoglobin: 11.8 g/dL — ABNORMAL LOW (ref 13.0–17.0)
MCH: 25.7 pg — ABNORMAL LOW (ref 26.0–34.0)
MCHC: 29.9 g/dL — ABNORMAL LOW (ref 30.0–36.0)
MCV: 85.9 fL (ref 80.0–100.0)
PLATELETS: 276 10*3/uL (ref 150–400)
RBC: 4.6 MIL/uL (ref 4.22–5.81)
RDW: 14.9 % (ref 11.5–15.5)
WBC: 8.1 10*3/uL (ref 4.0–10.5)
nRBC: 0 % (ref 0.0–0.2)

## 2018-08-04 LAB — GLUCOSE, CAPILLARY
GLUCOSE-CAPILLARY: 145 mg/dL — AB (ref 70–99)
Glucose-Capillary: 64 mg/dL — ABNORMAL LOW (ref 70–99)
Glucose-Capillary: 139 mg/dL — ABNORMAL HIGH (ref 70–99)
Glucose-Capillary: 110 mg/dL — ABNORMAL HIGH (ref 70–99)
Glucose-Capillary: 92 mg/dL (ref 70–99)
Glucose-Capillary: 159 mg/dL — ABNORMAL HIGH (ref 70–99)
Glucose-Capillary: 160 mg/dL — ABNORMAL HIGH (ref 70–99)

## 2018-08-04 LAB — BASIC METABOLIC PANEL
Anion gap: 10 (ref 5–15)
BUN: 31 mg/dL — ABNORMAL HIGH (ref 6–20)
CO2: 32 mmol/L (ref 22–32)
Calcium: 9.2 mg/dL (ref 8.9–10.3)
Chloride: 98 mmol/L (ref 98–111)
Creatinine, Ser: 1.44 mg/dL — ABNORMAL HIGH (ref 0.61–1.24)
GFR calc Af Amer: >60 mL/min (ref >60)
GFR calc non Af Amer: 54 mL/min — ABNORMAL LOW (ref >60)
Glucose, Bld: 117 mg/dL — ABNORMAL HIGH (ref 70–99)
Potassium: 4.2 mmol/L (ref 3.5–5.1)
Sodium: 140 mmol/L (ref 135–145)

## 2018-08-04 LAB — ECHOCARDIOGRAM COMPLETE
Height: 70 in
WEIGHTICAEL: 3396.85 [oz_av]

## 2018-08-04 LAB — MAGNESIUM: Magnesium: 2.6 mg/dL — ABNORMAL HIGH (ref 1.7–2.4)

## 2018-08-04 MED ORDER — CEFAZOLIN SODIUM-DEXTROSE 1-4 GM/50ML-% IV SOLN
1.0000 g | Freq: Three times a day (TID) | INTRAVENOUS | Status: DC
  Administered 2018-08-04 – 2018-08-07 (×9): 1 g via INTRAVENOUS
  Filled 2018-08-04 (×10): qty 50

## 2018-08-04 MED ORDER — INSULIN ASPART 100 UNIT/ML ~~LOC~~ SOLN
0.0000 [IU] | Freq: Three times a day (TID) | SUBCUTANEOUS | Status: DC
  Administered 2018-08-04: 2 [IU] via SUBCUTANEOUS
  Administered 2018-08-05: 3 [IU] via SUBCUTANEOUS
  Administered 2018-08-06: 2 [IU] via SUBCUTANEOUS
  Administered 2018-08-06: 3 [IU] via SUBCUTANEOUS
  Administered 2018-08-07: 2 [IU] via SUBCUTANEOUS

## 2018-08-04 MED ORDER — INSULIN GLARGINE 100 UNIT/ML ~~LOC~~ SOLN
15.0000 [IU] | Freq: Every day | SUBCUTANEOUS | Status: DC
  Administered 2018-08-04: 15 [IU] via SUBCUTANEOUS
  Filled 2018-08-04: qty 0.15

## 2018-08-04 MED ORDER — CEFAZOLIN SODIUM-DEXTROSE 1-4 GM/50ML-% IV SOLN
1.0000 g | Freq: Three times a day (TID) | INTRAVENOUS | Status: DC
  Filled 2018-08-04: qty 50

## 2018-08-04 MED ORDER — PERFLUTREN LIPID MICROSPHERE
1.0000 mL | INTRAVENOUS | Status: AC | PRN
  Administered 2018-08-04: 3 mL via INTRAVENOUS
  Filled 2018-08-04: qty 10

## 2018-08-04 NOTE — Consult Note (Addendum)
[facility] for Infectious Disease    Date of Admission:  08/01/2018   Total days of antibiotics: 0               Reason for Consult: Cellulitis    Referring Provider: Aundra Dubin   Assessment: L 5th metatarsal osteomyelitis DM2 CABG 2002 Ischemic Cardiomyopathy CKD ICD  Plan: 1. Start ancef 2. He has PIC 3. Plan for 42 days 4. F/u in ID clinic 5. Check ESR and CRP 6. Continued wound care f/u   Thank you so much for this interesting consult,  Active Problems:   Systolic CHF ([place])   . allopurinol  600 mg Oral Daily  . aspirin EC  81 mg Oral Daily  . digoxin  0.125 mg Oral Daily  . enoxaparin (LOVENOX) injection  40 mg Subcutaneous Q24H  . furosemide  80 mg Intravenous TID  . insulin aspart  0-15 Units Subcutaneous TID WC  . insulin aspart  0-5 Units Subcutaneous QHS  . insulin aspart  5 Units Subcutaneous TID WC  . insulin glargine  15 Units Subcutaneous QHS  . metolazone  5 mg Oral Daily  . potassium chloride  40 mEq Oral BID  . rosuvastatin  40 mg Oral q1800  . sacubitril-valsartan  1 tablet Oral BID  . sodium chloride flush  10-40 mL Intracatheter Q12H  . spironolactone  25 mg Oral Daily    HPI: Marcus Johnson is a 57 y.o. male with hx of DM and CABG, chronic wound of his L foot. He was seen by wound care center, was referred to ID for concern of bone infection in his foot. He had CT done 12-2 showing early findings of osteo at 5th MT base.  He states he was previously given po anbx, can't remember names.  He had superficial Cx 05-2018 and 07-2018 of his wound which both grew MSSA.  He was then reported 30# wt gain over 2-3 weeks by CHF clinic. In ED 12-27 he was noted to have 43# wt gain. His WBC was normal and he was afebrile.    Review of Systems: Review of Systems  Constitutional: Negative for fever and weight loss.  Respiratory: Negative for shortness of breath.   Gastrointestinal: Positive for constipation. Negative for diarrhea.    Genitourinary: Negative for dysuria.  Neurological: Negative for sensory change.  Please see HPI. All other systems reviewed and negative.   Past Medical History:  Diagnosis Date  . AICD (automatic cardioverter/defibrillator) present    Single-chamber  implantable cardiac defibrillator - Medtronic  . CHF (congestive heart failure) ([place])   . Chronic systolic heart failure ([place])    a. Echo 5/13: Mild LVE, mild LVH, EF 10%, anteroseptal, lateral, apical AK, mild MR, mild LAE, moderately reduced RVSF, mild RAE, PASP 60;  b. 07/2014 Echo: EF 20-2%, diff HK, AKI of antsep/apical/mid-apicalinferior, mod reduced RV.  Marland Kitchen Coronary artery disease    a. s/p CABG 2002 b. LHC 5/13:  dLM 80%, LAD subtotally occluded, pCFX occluded, pRCA 50%, mid? Occlusion with high take off of the PDA with 70% multiple lesions-not bypassed and supplies collaterals to LAD, LIMA-IM/ramus ok, S-OM ok, S-PLA branches ok. Medical therapy was recommended  . Gout    "on daily RX" (01/08/2018)  . Hypertension   . Ischemic cardiomyopathy    a. Echo 5/13: Mild LVE, mild LVH, EF 10%, anteroseptal, lateral, apical AK, mild MR, mild LAE, moderately reduced RVSF, mild RAE, PASP 60;  b.  01/2012 s/p MDT [id number] Protecta XT VR AICD;  c. 07/2014 Echo: EF 20-2%, diff HK, AKI of antsep/apical/mid-apicalinferior, mod reduced RV.  Marland Kitchen MRSA (methicillin resistant Staphylococcus aureus)    Status post right foot plantar deep infection with MRSA status post  I&D 10/2008  . Myocardial infarction [facility])    "was told I'd had several before heart OR in 2002" (01/08/2018)  . Noncompliance   . Peripheral neuropathy   . Syncope   . Type II diabetes mellitus ([place])    requiring insulin     Social History   Tobacco Use  . Smoking status: Never Smoker  . Smokeless tobacco: Never Used  Substance Use Topics  . Alcohol use: No    Alcohol/week: 0.0 standard drinks  . Drug use: No    Family History  Problem Relation Age of Onset  . Diabetes Father         died in his 43's  . Hypertension Mother        died in her 74's - had a ppm.     Medications:  Scheduled: . allopurinol  600 mg Oral Daily  . aspirin EC  81 mg Oral Daily  . digoxin  0.125 mg Oral Daily  . enoxaparin (LOVENOX) injection  40 mg Subcutaneous Q24H  . furosemide  80 mg Intravenous TID  . insulin aspart  0-15 Units Subcutaneous TID WC  . insulin aspart  0-5 Units Subcutaneous QHS  . insulin aspart  5 Units Subcutaneous TID WC  . insulin glargine  15 Units Subcutaneous QHS  . metolazone  5 mg Oral Daily  . potassium chloride  40 mEq Oral BID  . rosuvastatin  40 mg Oral q1800  . sacubitril-valsartan  1 tablet Oral BID  . sodium chloride flush  10-40 mL Intracatheter Q12H  . spironolactone  25 mg Oral Daily    Abtx:  Anti-infectives (From admission, onward)   None        OBJECTIVE: Blood pressure 99/78, pulse (!) 101, temperature 98.3 F (36.8 C), temperature source Oral, resp. rate 20, height _0  (1.778 m), weight 96.3 kg, SpO2 96 %.  Physical Exam Constitutional:      Appearance: He is normal weight.  HENT:     Head: Normocephalic.     Mouth/Throat:     Mouth: Mucous membranes are moist.     Pharynx: No oropharyngeal exudate.  Eyes:     Extraocular Movements: Extraocular movements intact.     Pupils: Pupils are equal, round, and reactive to light.  Neck:     Musculoskeletal: Normal range of motion and neck supple.  Cardiovascular:     Rate and Rhythm: Normal rate and regular rhythm.  Pulmonary:     Effort: Pulmonary effort is normal.     Breath sounds: Normal breath sounds.  Abdominal:     General: Bowel sounds are normal.     Palpations: Abdomen is soft.  Musculoskeletal:        General: Swelling present.  Skin:      Neurological:     General: No focal deficit present.     Mental Status: He is alert.     Sensory: No sensory deficit.  Psychiatric:        Mood and Affect: Mood normal.        Behavior: Behavior normal.    Media  Information   Document Information   Photos    08/04/2018 11:56  Attached To:  Hospital Encounter on 08/01/18  Source Information  Campbell Riches, MD  [id number]    Lab Results Results for orders placed or performed during the hospital encounter of 08/01/18 (from the past 48 hour(s))  Glucose, capillary     Status: None   Collection Time: 08/02/18  4:07 PM  Result Value Ref Range   Glucose-Capillary 89 70 - 99 mg/dL   Comment 1 Notify RN   Glucose, capillary     Status: Abnormal   Collection Time: 08/02/18  8:59 PM  Result Value Ref Range   Glucose-Capillary 64 (L) 70 - 99 mg/dL   Comment 1 QC Due   Glucose, capillary     Status: Abnormal   Collection Time: 08/02/18  9:22 PM  Result Value Ref Range   Glucose-Capillary 65 (L) 70 - 99 mg/dL  Glucose, capillary     Status: Abnormal   Collection Time: 08/02/18  9:47 PM  Result Value Ref Range   Glucose-Capillary 112 (H) 70 - 99 mg/dL  Basic metabolic panel     Status: Abnormal   Collection Time: 08/03/18  4:33 AM  Result Value Ref Range   Sodium 143 135 - 145 mmol/L   Potassium 3.7 3.5 - 5.1 mmol/L   Chloride 104 98 - 111 mmol/L   CO2 31 22 - 32 mmol/L   Glucose, Bld 84 70 - 99 mg/dL   BUN 31 (H) 6 - 20 mg/dL   Creatinine, Ser 1.34 (H) 0.61 - 1.24 mg/dL   Calcium 8.9 8.9 - 10.3 mg/dL   GFR calc non Af Amer 58 (L) >60 mL/min   GFR calc Af Amer >60 >60 mL/min   Anion gap 8 5 - 15    Comment: Performed at [facility], [address], [place], [place] [address]  CBC     Status: Abnormal   Collection Time: 08/03/18  4:33 AM  Result Value Ref Range   WBC 8.2 4.0 - 10.5 K/uL   RBC 4.44 4.22 - 5.81 MIL/uL   Hemoglobin 11.5 (L) 13.0 - 17.0 g/dL   HCT 38.6 (L) 39.0 - 52.0 %   MCV 86.9 80.0 - 100.0 fL   MCH 25.9 (L) 26.0 - 34.0 pg   MCHC 29.8 (L) 30.0 - 36.0 g/dL   RDW 15.1 11.5 - 15.5 %   Platelets 250 150 - 400 K/uL   nRBC 0.0 0.0 - 0.2 %    Comment: Performed at [facility], [place]  [address], [place], [place] [address]  Magnesium     Status: None   Collection Time: 08/03/18  4:33 AM  Result Value Ref Range   Magnesium 2.4 1.7 - 2.4 mg/dL    Comment: Performed at [facility] [address] [place], [place] [address]  .Cooxemetry Panel (carboxy, met, total hgb, O2 sat)     Status: Abnormal   Collection Time: 08/03/18  4:42 AM  Result Value Ref Range   Total hemoglobin 12.0 12.0 - 16.0 g/dL   O2 Saturation 48.8 %   Carboxyhemoglobin 1.7 (H) 0.5 - 1.5 %   Methemoglobin 1.6 (H) 0.0 - 1.5 %  Glucose, capillary     Status: None   Collection Time: 08/03/18  6:12 AM  Result Value Ref Range   Glucose-Capillary 72 70 - 99 mg/dL  Glucose, capillary     Status: Abnormal   Collection Time: 08/03/18 11:10 AM  Result Value Ref Range   Glucose-Capillary 160 (H) 70 - 99 mg/dL   Comment 1 Notify RN  Glucose, capillary     Status: Abnormal   Collection Time: 08/03/18  4:06 PM  Result Value Ref Range   Glucose-Capillary 69 (L) 70 - 99 mg/dL   Comment 1 Notify RN   Glucose, capillary     Status: None   Collection Time: 08/03/18  4:58 PM  Result Value Ref Range   Glucose-Capillary 82 70 - 99 mg/dL   Comment 1 Notify RN   Glucose, capillary     Status: Abnormal   Collection Time: 08/03/18  9:26 PM  Result Value Ref Range   Glucose-Capillary 130 (H) 70 - 99 mg/dL  Glucose, capillary     Status: Abnormal   Collection Time: 08/04/18  3:44 AM  Result Value Ref Range   Glucose-Capillary 139 (H) 70 - 99 mg/dL  .Cooxemetry Panel (carboxy, met, total hgb, O2 sat)     Status: Abnormal   Collection Time: 08/04/18  4:58 AM  Result Value Ref Range   Total hemoglobin 12.4 12.0 - 16.0 g/dL   O2 Saturation 57.7 %   Carboxyhemoglobin 1.8 (H) 0.5 - 1.5 %   Methemoglobin 1.3 0.0 - 1.5 %  Basic metabolic panel     Status: Abnormal   Collection Time: 08/04/18  5:00 AM  Result Value Ref Range   Sodium 140 135 - 145 mmol/L   Potassium 4.2 3.5 - 5.1 mmol/L   Chloride 98 98 - 111 mmol/L     CO2 32 22 - 32 mmol/L   Glucose, Bld 117 (H) 70 - 99 mg/dL   BUN 31 (H) 6 - 20 mg/dL   Creatinine, Ser 1.44 (H) 0.61 - 1.24 mg/dL   Calcium 9.2 8.9 - 10.3 mg/dL   GFR calc non Af Amer 54 (L) >60 mL/min   GFR calc Af Amer >60 >60 mL/min   Anion gap 10 5 - 15    Comment: Performed at [facility], [place] [address], [place], [place] [address]  CBC     Status: Abnormal   Collection Time: 08/04/18  5:00 AM  Result Value Ref Range   WBC 8.1 4.0 - 10.5 K/uL   RBC 4.60 4.22 - 5.81 MIL/uL   Hemoglobin 11.8 (L) 13.0 - 17.0 g/dL   HCT 39.5 39.0 - 52.0 %   MCV 85.9 80.0 - 100.0 fL   MCH 25.7 (L) 26.0 - 34.0 pg   MCHC 29.9 (L) 30.0 - 36.0 g/dL   RDW 14.9 11.5 - 15.5 %   Platelets 276 150 - 400 K/uL   nRBC 0.0 0.0 - 0.2 %    Comment: Performed at [facility], [place] [address] [place], [place] [address]  Magnesium     Status: Abnormal   Collection Time: 08/04/18  5:00 AM  Result Value Ref Range   Magnesium 2.6 (H) 1.7 - 2.4 mg/dL    Comment: Performed at [facility] [address], [place], [place] [address]  Glucose, capillary     Status: Abnormal   Collection Time: 08/04/18  6:22 AM  Result Value Ref Range   Glucose-Capillary 110 (H) 70 - 99 mg/dL      Component Value Date/Time   SDES  07/18/2018 0825    FOOT LEFT LATERAL Performed at [facility], [place] [address] [place], [place] [address]    SPECREQUEST  07/18/2018 0825    NONE Performed at [facility], [place] [address], [place], [place] [address]    [place] 07/18/2018 0825  REPTSTATUS 07/20/2018 FINAL 07/18/2018 0825   No results found. No results found for this or any previous visit (from the past 240 hour(s)).  Microbiology: No results found for this or any previous visit (from the past 240 hour(s)).    Allergies  Allergen Reactions  . Meclizine Hcl Anaphylaxis and Swelling  . Ivabradine Nausea Only    OPAT Orders Discharge  antibiotics: ancef 1 g ivpb q8h  Duration: 42 days End Date: 09-15-2018  [facility] Care Per Protocol:  Labs weekly while on IV antibiotics: _x_ CBC with differential __ BMP _x_ CMP _x_ CRP _x_ ESR __ Vancomycin trough __ CK  _x_ Please pull PIC at completion of IV antibiotics __ Please leave PIC in place until doctor has seen patient or been notified  Fax weekly labs to [phone]  Clinic Follow Up Appt: 1 month, [place]  Radiographs and labs were personally reviewed by me.   Bobby Rumpf, MD [facility] [place] [facility] [phone] 08/04/2018, 11:38 AM

## 2018-08-04 NOTE — Progress Notes (Signed)
  Echocardiogram 2D Echocardiogram has been performed.  Marcus Johnson 08/04/2018, 3:02 PM

## 2018-08-04 NOTE — Progress Notes (Signed)
PHARMACY CONSULT NOTE FOR:  OUTPATIENT  PARENTERAL ANTIBIOTIC THERAPY (OPAT)  Indication: Osteomyelitis Regimen: Cefazolin 1 gram IV q8hrs End date: 09/15/2018  IV antibiotic discharge orders are pended. To discharging provider:  please sign these orders via discharge navigator,  Select New Orders & click on the button choice - Manage This Unsigned Work.     Thank you for allowing pharmacy to be a part of this patient's care.  Corinda Gubler 08/04/2018, 12:24 PM

## 2018-08-04 NOTE — Progress Notes (Signed)
Inpatient Diabetes Program Recommendations  AACE/ADA: New Consensus Statement on Inpatient Glycemic Control (2019)  Target Ranges:  Prepandial:   less than 140 mg/dL      Peak postprandial:   less than 180 mg/dL (1-2 hours)      Critically ill patients:  140 - 180 mg/dL   Results for SIDDHANT, HASHEMI (MRN [id number]) as of 08/04/2018 10:59  Ref. Range 08/03/2018 06:12 08/03/2018 11:10 08/03/2018 16:06 08/03/2018 16:58 08/03/2018 21:26 08/04/2018 03:44 08/04/2018 06:22  Glucose-Capillary Latest Ref Range: 70 - 99 mg/dL 72 160 (H)  Novolog 9 units (5 meal cov + 4 SSI) 69 (L) 82 130 (H) 139 (H) 110 (H)   Review of Glycemic Control  Diabetes history: DM2 Outpatient Diabetes medications: Lantus 60 units QHS, Novolog 5 units TID with meals Current orders for Inpatient glycemic control: Lantus 15 units QHS, Novolog 0-15 units TID with meals, Novolog 0-5 units QHS, Novolog 5 units TID with meals  Inpatient Diabetes Program Recommendations:   Insulin-Basal: Noted NO Lantus given on 08/03/18 or 08/02/18 and fasting glucose 110 mg/dl this morning. Lantus was decreased to 15 units QHS to start tonight at bedtime. MD may want to consider discontinuing Lantus at this time and if fasting glucose is greater than 180 mg/d then consider ordering Lantus 9 units Q24H.  Insulin - Meal Coverage: If patient experiences any other issues with hypoglycemia after receiving meal coverage insulin, please consider decreasing Novolog meal coverage to Novolog 3 units TID with meals if patient eats at least 50% of meals.  HbgA1C: A1C in process.  Thanks, Barnie Alderman, RN, MSN, CDE Diabetes Coordinator Inpatient Diabetes Program [phone] (Team Pager from 8am to 5pm)

## 2018-08-04 NOTE — Progress Notes (Addendum)
Advanced Heart Failure Rounding Note   Subjective:    Admitted 12/27 with massive volume overload and LE wounds.   PICC placed. Coox 58% today. CVP 14-15  Remains on 80 mg IV lasix TID + metolazone 5 mg daily. Weight down another 12 lbs (21 lbs total). Creatinine stable 1.44. 9 beats NSVT overnight. K 4.2.  Denies CP, SOB, or orthopnea. Having pain in left foot at wound site.   Objective:   Weight Range:  Vital Signs:   Temp:  [97.8 F (36.6 C)-98.4 F (36.9 C)] 98.3 F (36.8 C) (12/30 0346) Pulse Rate:  [89-97] 92 (12/30 0346) Resp:  [14-20] 20 (12/30 0346) BP: (93-118)/(62-72) 117/69 (12/30 0346) SpO2:  [95 %-100 %] 95 % (12/30 0346) Weight:  [96.3 kg] 96.3 kg (12/30 0346) Last BM Date: 08/01/18  Weight change: Filed Weights   08/02/18 0500 08/03/18 0619 08/04/18 0346  Weight: 106.9 kg 102 kg 96.3 kg    Intake/Output:   Intake/Output Summary (Last 24 hours) at 08/04/2018 0805 Last data filed at 08/04/2018 0624 Gross per 24 hour  Intake 800 ml  Output 4750 ml  Net -3950 ml     Physical Exam: General:  No resp difficulty. HEENT: Normal Neck: Supple. JVP to ear. Carotids 2+ bilat; no bruits. No thyromegaly or nodule noted. Cor: PMI nondisplaced. RRR, 2/6 MR.  Lungs: CTAB, normal effort. Abdomen: Soft, non-tender, +distended, no HSM. No bruits or masses. +BS  Extremities: No cyanosis, clubbing, or rash. R and LLE 2+ edema 3/4 to knees. Dressing to left foot.  Neuro: Alert & orientedx3, cranial nerves grossly intact. moves all 4 extremities w/o difficulty. Affect pleasant  Telemetry: NSR/sinus tach 90-100s. 9 beats NSVT overnight.  Personally reviewed.    Labs: Basic Metabolic Panel: Recent Labs  Lab 08/01/18 1251 08/01/18 1543 08/02/18 0511 08/03/18 0433 08/04/18 0500  NA 142  --  144 143 140  K 4.7  --  3.4* 3.7 4.2  CL 108  --  106 104 98  CO2 26  --  27 31 32  GLUCOSE 144*  --  80 84 117*  BUN 37*  --  31* 31* 31*  CREATININE 1.43*  1.50* 1.41* 1.34* 1.44*  CALCIUM 8.8*  --  8.6* 8.9 9.2  MG  --  2.2  --  2.4  --     Liver Function Tests: No results for input(s): AST, ALT, ALKPHOS, BILITOT, PROT, ALBUMIN in the last 168 hours. No results for input(s): LIPASE, AMYLASE in the last 168 hours. No results for input(s): AMMONIA in the last 168 hours.  CBC: Recent Labs  Lab 08/01/18 1251 08/01/18 1543 08/02/18 0511 08/03/18 0433 08/04/18 0500  WBC 8.3 8.1 7.4 8.2 8.1  NEUTROABS 5.6  --   --   --   --   HGB 11.5* 11.7* 10.9* 11.5* 11.8*  HCT 37.7* 39.3 36.4* 38.6* 39.5  MCV 87.1 88.1 87.1 86.9 85.9  PLT 245 234 221 250 276    Cardiac Enzymes: Recent Labs  Lab 08/01/18 1543 08/01/18 1801 08/02/18 0155 08/02/18 0511  TROPONINI 0.06* 0.06* 0.06* 0.06*    BNP: BNP (last 3 results) Recent Labs    01/07/18 1530 01/21/18 1403 08/01/18 1259  BNP 1,784.5* 992.3* 1,083.3*    ProBNP (last 3 results) No results for input(s): PROBNP in the last 8760 hours.    Other results:  Imaging: No results found.   Medications:     Scheduled Medications: . allopurinol  600 mg Oral Daily  .  aspirin EC  81 mg Oral Daily  . digoxin  0.125 mg Oral Daily  . enoxaparin (LOVENOX) injection  40 mg Subcutaneous Q24H  . furosemide  80 mg Intravenous TID  . insulin aspart  0-20 Units Subcutaneous TID WC  . insulin aspart  0-5 Units Subcutaneous QHS  . insulin aspart  5 Units Subcutaneous TID WC  . insulin glargine  30 Units Subcutaneous QHS  . metolazone  5 mg Oral Daily  . potassium chloride  40 mEq Oral BID  . rosuvastatin  40 mg Oral q1800  . sacubitril-valsartan  1 tablet Oral BID  . sodium chloride flush  10-40 mL Intracatheter Q12H  . spironolactone  25 mg Oral Daily    Infusions: . sodium chloride      PRN Medications: sodium chloride, acetaminophen, ondansetron (ZOFRAN) IV, sodium chloride flush   Assessment:   Mr. Frisch is a1 y.o.male with h/o DM2, CAD s/p CABG 1610, systolic HF due to  ischemic cardiomyopathy with EF 20-25% (echo 12/15), DM2 and CKD. He is s/p Medtronic single chamber ICD.  [facility]ed to [facility] on 12/27 with 30+ lb weight gain.    Plan/Discussion:     1. Acute on chronic systolic HF: ICM.Has Medtronic ICD. Has required milrinone multiple times in the past for low output HF. Offered advanced therapies, but declined.  - Echo 6/19 with EF 20% and moderately decreased RV systolic function.  - CPX 4/19 with severe functional limitation due to HF.  - RHC 6/19 showed low output and coronary angiography showed no interventional targets.  - Weight up over 40 lbs on admit.  - Volume improving. Down 21 lbs so far. CVP still ~15. - Continue lasix 80 mg IV TID + metolazone 42m daily. - Co-ox 76%-> 49% -> 58%. Continues to diurese well and renal function stable. Will not start inotropes yet. Continue to follow closely. - Continue Entresto 49/51 mg BID - Continue spironolactone 25 mg daily.  - Continue TED hose -Intolerant ivabradine due to nausea. - Hold BB with acute decompensation - Continue digoxin 0.125 mg daily. Dig level <0.2 today. - He is unsure if he would want home milrinone if he needed/offered. He remains uninterested in LVAD or transplant.   2. CAD s/p CABG 2002:  - Last cath in 6/19 with patent grafts. - continue ASA, statin  - No s/s ischemia.   3. HTN: - SBP 110s. Stable.   4. CKD: Stage 3. - Baseline creatinine 1.3-1.5 - Creatinine stable at 1.44  today. Monitor closely with diuresis.   5. Left foot wound - CT left foot 07/07/18: suspicion for early osteomyelitis involving the base of the 5th metatarsal - Followed by wound clinic. ESR 22 - WOC changed dressing yesterday. Ordered daily dressing changes.  - He was taking ibuprofen at home for foot pain. Says tylenol does not help much. Consider adding gabapentin.   6. Hypokalemia - K 4.2 today.  7. DM - Had a few episodes of hypoglycemia over the weekend. SSI and lantus  adjusted today per DM coordinator recommendations. Check A1c tomorrow. He tells me that he takes lantus most of the time, but rarely takes his novolog at home. PCP manages outpatient.   8. NSVT - K 4.2. Add on mag.   Length of Stay: 3   AGeorgiana ShoreMD 08/04/2018, 8:05 AM  Advanced Heart Failure Team Pager [phone](M-F; 7a - 4p)  Please contact [facility] for night-coverage after hours (4p -7a ) and weekends on [email or web address]  Patient seen with NP, agree with the above note.   He diuresed well again yesterday, creatinine stable.  Co-ox 58%.  CVP 15.  No dyspnea at rest.    On exam, left foot wrapped.  JVP 14-16 cm.  Regular S1S2, 2/6 HSM LLSB/apex.  Clear lungs.  1+ edema to thighs.   Patient remains volume overloaded on exam with adequate co-ox.  - Continue Lasix 80 mg IV tid with daily metolazone.  - Continue current Entresto, digoxin, and spironolactone.  - With short run NSVT last yesterday, will restart low dose Coreg 3.125 mg bid.  - Narrow QRS, not CRT candidate.  Has been very resistant to LVAD/transplant though evaluation for both offered in past.  Now with left foot wound/?osteo, will need to be resolved before considering LVAD even if he would agree to evaluation.  - Would repeat echo, with louder systolic murmur on exam (MR vs TR).   Left foot wound: Will ask ID to see for ?osteomyelitis based on imaging though ESR not elevated.  Will also do peripheral arterial doppler evaluation.   Loralie Champagne 08/04/2018 9:00 AM

## 2018-08-04 NOTE — Progress Notes (Signed)
ABI has been completed.   Preliminary results in CV Proc.   Marcus Johnson 08/04/2018 3:28 PM

## 2018-08-05 DIAGNOSIS — M8600 Acute hematogenous osteomyelitis, unspecified site: Secondary | ICD-10-CM

## 2018-08-05 LAB — BASIC METABOLIC PANEL
Anion gap: 11 (ref 5–15)
BUN: 37 mg/dL — ABNORMAL HIGH (ref 6–20)
CO2: 32 mmol/L (ref 22–32)
Calcium: 9.4 mg/dL (ref 8.9–10.3)
Chloride: 96 mmol/L — ABNORMAL LOW (ref 98–111)
Creatinine, Ser: 1.74 mg/dL — ABNORMAL HIGH (ref 0.61–1.24)
GFR calc Af Amer: 49 mL/min — ABNORMAL LOW (ref >60)
GFR, EST NON AFRICAN AMERICAN: 43 mL/min — AB (ref >60)
GLUCOSE: 111 mg/dL — AB (ref 70–99)
Potassium: 4.4 mmol/L (ref 3.5–5.1)
Sodium: 139 mmol/L (ref 135–145)

## 2018-08-05 LAB — COOXEMETRY PANEL
Carboxyhemoglobin: 1.8 % — ABNORMAL HIGH (ref 0.5–1.5)
Methemoglobin: 1.3 % (ref 0.0–1.5)
O2 Saturation: 57.1 %
TOTAL HEMOGLOBIN: 13.3 g/dL (ref 12.0–16.0)

## 2018-08-05 LAB — HEMOGLOBIN A1C
HEMOGLOBIN A1C: 6 % — AB (ref 4.8–5.6)
MEAN PLASMA GLUCOSE: 125.5 mg/dL

## 2018-08-05 LAB — CBC
HCT: 42 % (ref 39.0–52.0)
HEMOGLOBIN: 12.6 g/dL — AB (ref 13.0–17.0)
MCH: 25.3 pg — AB (ref 26.0–34.0)
MCHC: 30 g/dL (ref 30.0–36.0)
MCV: 84.3 fL (ref 80.0–100.0)
Platelets: 296 10*3/uL (ref 150–400)
RBC: 4.98 MIL/uL (ref 4.22–5.81)
RDW: 14.8 % (ref 11.5–15.5)
WBC: 8.9 10*3/uL (ref 4.0–10.5)
nRBC: 0 % (ref 0.0–0.2)

## 2018-08-05 LAB — GLUCOSE, CAPILLARY
GLUCOSE-CAPILLARY: 178 mg/dL — AB (ref 70–99)
Glucose-Capillary: 99 mg/dL (ref 70–99)
Glucose-Capillary: 90 mg/dL (ref 70–99)
Glucose-Capillary: 152 mg/dL — ABNORMAL HIGH (ref 70–99)

## 2018-08-05 LAB — DIGOXIN LEVEL: Digoxin Level: <0.2 ng/mL — ABNORMAL LOW (ref 0.8–2.0)

## 2018-08-05 MED ORDER — DIGOXIN 125 MCG PO TABS
0.1250 mg | ORAL_TABLET | Freq: Every day | ORAL | Status: DC
  Administered 2018-08-05 – 2018-08-07 (×3): 0.125 mg via ORAL
  Filled 2018-08-05 (×3): qty 1

## 2018-08-05 MED ORDER — TORSEMIDE 20 MG PO TABS: 100.0000 mg | ORAL_TABLET | Freq: Two times a day (BID) | ORAL | Status: DC

## 2018-08-05 MED ORDER — TORSEMIDE 20 MG PO TABS
100.0000 mg | ORAL_TABLET | Freq: Two times a day (BID) | ORAL | Status: DC
  Administered 2018-08-05: 100 mg via ORAL
  Filled 2018-08-05: qty 5

## 2018-08-05 MED ORDER — CEFAZOLIN IV (FOR PTA / DISCHARGE USE ONLY): 1.0000 g | Freq: Three times a day (TID) | INTRAVENOUS | 0 refills | Status: AC

## 2018-08-05 MED ORDER — CARVEDILOL 3.125 MG PO TABS
3.1250 mg | ORAL_TABLET | Freq: Two times a day (BID) | ORAL | Status: DC
  Administered 2018-08-05 – 2018-08-06 (×4): 3.125 mg via ORAL
  Filled 2018-08-05 (×4): qty 1

## 2018-08-05 MED ORDER — FUROSEMIDE 10 MG/ML IJ SOLN
80.0000 mg | Freq: Once | INTRAMUSCULAR | Status: AC
  Administered 2018-08-05: 80 mg via INTRAVENOUS
  Filled 2018-08-05: qty 8

## 2018-08-05 NOTE — Progress Notes (Addendum)
Advanced Heart Failure Rounding Note   Subjective:    Admitted 12/27 with massive volume overload and LE wounds.   Remains on lasix 80 mg IV TID + 5 mg metolazone daily. Weight down 10 more lbs overnight (31 lbs total). I/O net negative 4.6 L yesterday. Coox 57%. CVP 9-10. Creatinine up 1.44 > 1.74.  ID consulted for left foot wound and started him on IV ancef q8 hours x 42 days. He has DL PICC.  17 beats NSVT and one run of SVT overnight. K 4.4. Mag 2.6 yesterday.  Denies CP, SOB, or orthopnea. No dizziness, but says since he started the antibiotic, he is seeing some dark spots.  ABIs 12/30: RLE normal, LLE mild lower extremity arterial disease (L ABI 0.92).   Echo 12/30: EF 20-25%, mild LVH, diffuse HK, akinesis of anteroseptal and anterior myocardium, grade 2 DD, trivial AI, LA mod dilated, RV moderately dilated, RA mildly dilated, moderate TR, PA peak pressure 49 mm Hg  Objective:   Weight Range:  Vital Signs:   Temp:  [98.3 F (36.8 C)-98.9 F (37.2 C)] 98.9 F (37.2 C) (12/31 0446) Pulse Rate:  [93-101] 93 (12/30 2332) Resp:  [19-21] 21 (12/30 2332) BP: (99-113)/(71-85) 108/72 (12/30 2332) SpO2:  [94 %-99 %] 97 % (12/31 0446) Weight:  [92 kg] 92 kg (12/31 0446) Last BM Date: 08/04/18  Weight change: Filed Weights   08/03/18 0619 08/04/18 0346 08/05/18 0446  Weight: 102 kg 96.3 kg 92 kg    Intake/Output:   Intake/Output Summary (Last 24 hours) at 08/05/2018 0756 Last data filed at 08/05/2018 0447 Gross per 24 hour  Intake 400 ml  Output 5055 ml  Net -4655 ml     Physical Exam: General: Lying flat in bed. No resp difficulty. HEENT: Normal Neck: Supple. JVP ~10. Carotids 2+ bilat; no bruits. No thyromegaly or nodule noted. Cor: PMI nondisplaced. Regular, slightly tachy, 2/6 MR Lungs: CTAB, normal effort. Abdomen: Soft, non-tender, non-distended, no HSM. No bruits or masses. +BS  Extremities: No cyanosis, clubbing, or rash. LLE 1+ edema. Left foot  wrapped. RUE DL PICC line.  Neuro: Alert & orientedx3, cranial nerves grossly intact. moves all 4 extremities w/o difficulty. Affect pleasant  Telemetry: NSR/sinus tach 90-100s. 17 beats NSVT and run of SVT overnight.    Labs: Basic Metabolic Panel: Recent Labs  Lab 08/01/18 1251 08/01/18 1543 08/02/18 0511 08/03/18 0433 08/04/18 0500 08/05/18 0241  NA 142  --  144 143 140 139  K 4.7  --  3.4* 3.7 4.2 4.4  CL 108  --  106 104 98 96*  CO2 26  --  27 31 32 32  GLUCOSE 144*  --  80 84 117* 111*  BUN 37*  --  31* 31* 31* 37*  CREATININE 1.43* 1.50* 1.41* 1.34* 1.44* 1.74*  CALCIUM 8.8*  --  8.6* 8.9 9.2 9.4  MG  --  2.2  --  2.4 2.6*  --     Liver Function Tests: No results for input(s): AST, ALT, ALKPHOS, BILITOT, PROT, ALBUMIN in the last 168 hours. No results for input(s): LIPASE, AMYLASE in the last 168 hours. No results for input(s): AMMONIA in the last 168 hours.  CBC: Recent Labs  Lab 08/01/18 1251 08/01/18 1543 08/02/18 0511 08/03/18 0433 08/04/18 0500 08/05/18 0241  WBC 8.3 8.1 7.4 8.2 8.1 8.9  NEUTROABS 5.6  --   --   --   --   --   HGB 11.5* 11.7* 10.9* 11.5*  11.8* 12.6*  HCT 37.7* 39.3 36.4* 38.6* 39.5 42.0  MCV 87.1 88.1 87.1 86.9 85.9 84.3  PLT 245 234 221 250 276 296    Cardiac Enzymes: Recent Labs  Lab 08/01/18 1543 08/01/18 1801 08/02/18 0155 08/02/18 0511  TROPONINI 0.06* 0.06* 0.06* 0.06*    BNP: BNP (last 3 results) Recent Labs    01/07/18 1530 01/21/18 1403 08/01/18 1259  BNP 1,784.5* 992.3* 1,083.3*    ProBNP (last 3 results) No results for input(s): PROBNP in the last 8760 hours.    Other results:  Imaging: Vas [place] Abi With/wo Tbi  Result Date: 08/04/2018 LOWER EXTREMITY DOPPLER STUDY Indications: Claudication, and ulceration.  Performing Technologist: Abram Sander RVS  Examination Guidelines: A complete evaluation includes at minimum, Doppler waveform signals and systolic blood pressure reading at the level of  bilateral brachial, anterior tibial, and posterior tibial arteries, when vessel segments are accessible. Bilateral testing is considered an integral part of a complete examination. Photoelectric Plethysmograph (PPG) waveforms and toe systolic pressure readings are included as required and additional duplex testing as needed. Limited examinations for reoccurring indications may be performed as noted.  ABI Findings: +--------+------------------+-----+---------+---------+ Right   Rt Pressure (mmHg)IndexWaveform Comment   +--------+------------------+-----+---------+---------+ Brachial                       triphasicPICC Line +--------+------------------+-----+---------+---------+ PTA     116               0.91 biphasic           +--------+------------------+-----+---------+---------+ DP      127               1.00 biphasic           +--------+------------------+-----+---------+---------+ +--------+------------------+-----+---------+-------+ Left    Lt Pressure (mmHg)IndexWaveform Comment +--------+------------------+-----+---------+-------+ [id number]                    triphasic        +--------+------------------+-----+---------+-------+ PTA     117               0.92 biphasic         +--------+------------------+-----+---------+-------+ DP      101               0.80 biphasic         +--------+------------------+-----+---------+-------+ +-------+-----------+-----------+------------+------------+ ABI/TBIToday's ABIToday's TBIPrevious ABIPrevious TBI +-------+-----------+-----------+------------+------------+ Right  1.00       0.42                                +-------+-----------+-----------+------------+------------+ Left   0.92       0.44                                +-------+-----------+-----------+------------+------------+  Summary: Right: Resting right ankle-brachial index is within normal range. No evidence of significant right lower  extremity arterial disease. Left: Resting left ankle-brachial index indicates mild left lower extremity arterial disease.  *See table(s) above for measurements and observations.  Electronically signed by Deitra Mayo MD on 08/04/2018 at 5:10:14 PM.   Final      Medications:     Scheduled Medications: . allopurinol  600 mg Oral Daily  . aspirin EC  81 mg Oral Daily  . digoxin  0.125 mg Oral Daily  . enoxaparin (LOVENOX) injection  40 mg Subcutaneous Q24H  .  insulin aspart  0-15 Units Subcutaneous TID WC  . insulin aspart  0-5 Units Subcutaneous QHS  . insulin aspart  5 Units Subcutaneous TID WC  . insulin glargine  15 Units Subcutaneous QHS  . potassium chloride  40 mEq Oral BID  . rosuvastatin  40 mg Oral q1800  . sacubitril-valsartan  1 tablet Oral BID  . sodium chloride flush  10-40 mL Intracatheter Q12H  . spironolactone  25 mg Oral Daily    Infusions: . sodium chloride    .  ceFAZolin (ANCEF) IV 1 g (08/05/18 0617)    PRN Medications: sodium chloride, acetaminophen, ondansetron (ZOFRAN) IV, sodium chloride flush   Assessment:   Mr. Hildebran is a27 y.o.male with h/o DM2, CAD s/p CABG 4098, systolic HF due to ischemic cardiomyopathy with EF 20-25% (echo 12/15), DM2 and CKD. He is s/p Medtronic single chamber ICD.  Presented to [facility] on 12/27 with 30+ lb weight gain.    Plan/Discussion:     1. Acute on chronic systolic HF: ICM.Has Medtronic ICD. Has required milrinone multiple times in the past for low output HF. Offered advanced therapies, but declined.  - Echo 6/19 with EF 20% and moderately decreased RV systolic function.  - CPX 4/19 with severe functional limitation due to HF.  - RHC 6/19 showed low output and coronary angiography showed no interventional targets.  - Echo 08/04/18: EF 20-25%, mild LVH, diffuse HK, akinesis of anteroseptal and anterior myocardium, grade 2 DD, trivial AI, LA mod dilated, RV moderately dilated, RA mildly dilated, moderate  TR, PA peak pressure 49 mm Hg - Weight up over 40 lbs on admit.  - Volume much improved. CVP 9-10. Down 31 lbs. Creatinine 1.44 > 1.74.  - Stop IV lasix and daily metolazone. Resume torsemide 100 mg BID starting this afternoon.  - Co-ox 57%. Follow closely. - Continue Entresto 49/51 mg BID - Continue spironolactone 25 mg daily.  - Continue TED hose -Intolerant ivabradine due to nausea. - Resume coreg 3.125 mg BID - Continue digoxin 0.125 mg daily. Check dig level again this morning with vision changes. See below.  - He is unsure if he would want home milrinone if he needed/offered. He remains uninterested in LVAD or transplant.   2. CAD s/p CABG 2002:  - Last cath in 6/19 with patent grafts. - continue ASA, statin  - No s/s ischemia.   3. HTN: - SBP 110s. Stable.   4. CKD: Stage 3. - Baseline creatinine 1.3-1.5 - Creatinine up 1.44 > 1.74 today. Stop IV lasix and metolazone as above.   5. Left foot wound: MSSA osteomyelitis.  - CT left foot 07/07/18: suspicion for early osteomyelitis involving the base of the 5th metatarsal - Followed by wound clinic. ESR 22 - WOC saw 12/29. Ordered daily dressing changes.  - ID consulted yesterday and started him on Ancef. Will need IV abx for home x 42 days. Has PICC. Will let Carolynn Sayers with [facility] know.  - ABIs 12/30: RLE normal, LLE mild lower extremity arterial disease  6. Hypokalemia - K 4.4  7. DM - Lantus DCd per DM coordinator recommendations. A1C 6.0 this am.   - He tells me that he takes lantus most of the time, but rarely takes his novolog at home. PCP manages outpatient.   8. NSVT - K 4.4 today. Mag 2.6 yesterday. - Restart coreg  9. Vision changes - Started after antibiotics. Has happened before. Not associated with movement or position. Check dig level this  morning prior to giving digoxin. Discussed with RN.   Length of Stay: [place], NP 08/05/2018, 7:56 AM  Advanced Heart Failure  Team Pager [phone] (M-F; 7a - 4p)  Please contact [facility] for night-coverage after hours (4p -7a ) and weekends on [email or web address]  Patient seen with NP, agree with the above note.   He diuresed well again yesterday, creatinine up a bit to 1.7.  Co-ox 57%.  CVP 9-10.  No dyspnea at rest.    On exam, left foot wrapped.  JVP 10 cm.  Regular S1S2, 2/6 HSM LLSB.  Clear lungs.  1+ edema to knee on left, trace ankle edema on right.   Patient diuresed well but with rise in creatinine to 1.7.  Co-ox adequate at 57%.  CVP 9-10, still mild volume overload on exam.   - I will give Lasix 80 mg IV x 1 more dose then resume home torsemide dosing this evening.  He will likely need periodic metolazone at home as he is already on a high torsemide dose.  - Continue current Entresto, digoxin, and spironolactone. Will check digoxin level again with some possible visual changes.  - With short run NSVT, restarting Coreg 3.125 mg bid.   - Narrow QRS, not CRT candidate.  Has been very resistant to LVAD/transplant though evaluation for both offered in past.  Now with left foot osteomyelitis, will need treatment/resolution before considering LVAD even if he would agree to evaluation.   Left foot wound: Suspect osteomyelitis, MSSA.  Left ABI only mildly decreased.  He has been started on Ancef per ID, will need 6 week course.  Has PICC in place.   Suspect that he will be ready for home Wednesday or Thursday.   Loralie Champagne 08/05/2018 10:10 AM

## 2018-08-05 NOTE — Care Management Note (Signed)
Case Management Note Marvetta Gibbons RN, BSN Transitions of Care Unit 4E- RN Case Manager [phone]  Patient Details  Name: Marcus Johnson MRN: [id number] Date of Birth: 19-May-1961  Subjective/Objective:    Pt admitted with HF with massive volume overload and LE wounds               Action/Plan: PTA pt lived at home per ID pt will need IV abx until 09/15/18- will need HHRN orders and OPAT for iv abx needs- pt has PICC in place- CM spoke with pt at bedside to discuss transition of care needs- per pt he has brother and son that can assist him with his IV abx needs at home. CM provided [facility] agency list per CMS gov site with star ratings- per pt he has used [facility] in past for IV abx- has chosen to use them again for IV abx and HH needs- orders pending- CM has spoken with Pam at [facility] regarding home IV abx needs- referral accepted. CM will continue to follow  Expected Discharge Date:                  Expected Discharge Plan:  [place]  In-House Referral:     Discharge planning Services  CM Consult  Post Acute Care Choice:  Home Health Choice offered to:  Patient  DME Arranged:    DME Agency:     HH Arranged:  RN, IV Antibiotics HH Agency:  [facility]  Status of Service:  In process, will continue to follow  If discussed at Long Length of Stay Meetings, dates discussed:    Discharge Disposition: home/home health   Additional Comments:  Dawayne Patricia, RN 08/05/2018, 12:10 PM

## 2018-08-06 LAB — CBC
HCT: 43.3 % (ref 39.0–52.0)
Hemoglobin: 13.4 g/dL (ref 13.0–17.0)
MCH: 26.2 pg (ref 26.0–34.0)
MCHC: 30.9 g/dL (ref 30.0–36.0)
MCV: 84.7 fL (ref 80.0–100.0)
Platelets: 328 10*3/uL (ref 150–400)
RBC: 5.11 MIL/uL (ref 4.22–5.81)
RDW: 14.7 % (ref 11.5–15.5)
WBC: 8.3 10*3/uL (ref 4.0–10.5)
nRBC: 0 % (ref 0.0–0.2)

## 2018-08-06 LAB — BASIC METABOLIC PANEL
Anion gap: 10 (ref 5–15)
BUN: 46 mg/dL — ABNORMAL HIGH (ref 6–20)
CO2: 33 mmol/L — ABNORMAL HIGH (ref 22–32)
Calcium: 9.1 mg/dL (ref 8.9–10.3)
Chloride: 95 mmol/L — ABNORMAL LOW (ref 98–111)
Creatinine, Ser: 1.84 mg/dL — ABNORMAL HIGH (ref 0.61–1.24)
GFR calc Af Amer: 46 mL/min — ABNORMAL LOW (ref >60)
GFR calc non Af Amer: 40 mL/min — ABNORMAL LOW (ref >60)
GLUCOSE: 145 mg/dL — AB (ref 70–99)
Potassium: 4.9 mmol/L (ref 3.5–5.1)
Sodium: 138 mmol/L (ref 135–145)

## 2018-08-06 LAB — GLUCOSE, CAPILLARY
Glucose-Capillary: 162 mg/dL — ABNORMAL HIGH (ref 70–99)
Glucose-Capillary: 92 mg/dL (ref 70–99)
Glucose-Capillary: 135 mg/dL — ABNORMAL HIGH (ref 70–99)
Glucose-Capillary: 120 mg/dL — ABNORMAL HIGH (ref 70–99)

## 2018-08-06 LAB — COOXEMETRY PANEL
CARBOXYHEMOGLOBIN: 1.8 % — AB (ref 0.5–1.5)
Methemoglobin: 1.4 % (ref 0.0–1.5)
O2 Saturation: 56.7 %
Total hemoglobin: 13.6 g/dL (ref 12.0–16.0)

## 2018-08-06 MED ORDER — TORSEMIDE 20 MG PO TABS: 100.0000 mg | ORAL_TABLET | Freq: Two times a day (BID) | ORAL | Status: DC

## 2018-08-06 NOTE — Progress Notes (Signed)
Patient ID: Marcus Johnson, male   DOB: [birth year], 58 y.o.   MRN: [id number]    Advanced Heart Failure Rounding Note   Subjective:    Admitted 12/27 with massive volume overload and LE wounds.   He was transitioned to po antibiotics yesterday.  Weight down 3 lbs again today, 38 lbs total.  CVP 7-8 cm on my measure.  Co-ox 57%.  Creatinine up a bit to 1.89.   ID consulted for left foot wound and started him on IV ancef q8 hours x 42 days. He has PICC.  PVCs overnight but no NSVT.   No dyspnea or chest pain, feels better.   ABIs 12/30: RLE normal, LLE mild lower extremity arterial disease (L ABI 0.92).   Echo 12/30: EF 20-25%, mild LVH, diffuse HK, akinesis of anteroseptal and anterior myocardium, grade 2 DD, trivial AI, LA mod dilated, RV moderately dilated, RA mildly dilated, moderate TR, PA peak pressure 49 mm Hg  Objective:   Weight Range:  Vital Signs:   Temp:  [97.9 F (36.6 C)-99.1 F (37.3 C)] 99.1 F (37.3 C) (01/01 0504) Pulse Rate:  [92-122] 94 (01/01 0805) Resp:  [15-26] 26 (01/01 0504) BP: (101-108)/(60-75) 106/75 (01/01 0804) SpO2:  [97 %-99 %] 97 % (01/01 0504) Weight:  [90.6 kg] 90.6 kg (01/01 0504) Last BM Date: 08/04/18  Weight change: Filed Weights   08/04/18 0346 08/05/18 0446 08/06/18 0504  Weight: 96.3 kg 92 kg 90.6 kg    Intake/Output:   Intake/Output Summary (Last 24 hours) at 08/06/2018 0929 Last data filed at 08/06/2018 0806 Gross per 24 hour  Intake 739.72 ml  Output 2800 ml  Net -2060.28 ml     Physical Exam: General: NAD Neck: JVP 8 cm, no thyromegaly or thyroid nodule.  Lungs: Clear to auscultation bilaterally with normal respiratory effort. CV: Lateral PMI.  Heart regular S1/S2, no S3/S4, no murmur.  1+ edema left lower leg.  Left foot wrapped.  Abdomen: Soft, nontender, no hepatosplenomegaly, no distention.  Skin: Intact without lesions or rashes.  Neurologic: Alert and oriented x 3.  Psych: Normal affect. Extremities: No  clubbing or cyanosis.  HEENT: Normal.    Telemetry: NSR 90s-100s, PVCs.  Personally reviewed.     Labs: Basic Metabolic Panel: Recent Labs  Lab 08/01/18 1543 08/02/18 0511 08/03/18 0433 08/04/18 0500 08/05/18 0241 08/06/18 0420  NA  --  144 143 140 139 138  K  --  3.4* 3.7 4.2 4.4 4.9  CL  --  106 104 98 96* 95*  CO2  --  27 31 32 32 33*  GLUCOSE  --  80 84 117* 111* 145*  BUN  --  31* 31* 31* 37* 46*  CREATININE 1.50* 1.41* 1.34* 1.44* 1.74* 1.84*  CALCIUM  --  8.6* 8.9 9.2 9.4 9.1  MG 2.2  --  2.4 2.6*  --   --     Liver Function Tests: No results for input(s): AST, ALT, ALKPHOS, BILITOT, PROT, ALBUMIN in the last 168 hours. No results for input(s): LIPASE, AMYLASE in the last 168 hours. No results for input(s): AMMONIA in the last 168 hours.  CBC: Recent Labs  Lab 08/01/18 1251  08/02/18 0511 08/03/18 0433 08/04/18 0500 08/05/18 0241 08/06/18 0420  WBC 8.3   < > 7.4 8.2 8.1 8.9 8.3  NEUTROABS 5.6  --   --   --   --   --   --   HGB 11.5*   < > 10.9* 11.5* 11.8*  12.6* 13.4  HCT 37.7*   < > 36.4* 38.6* 39.5 42.0 43.3  MCV 87.1   < > 87.1 86.9 85.9 84.3 84.7  PLT 245   < > 221 250 276 296 328   < > = values in this interval not displayed.    Cardiac Enzymes: Recent Labs  Lab 08/01/18 1543 08/01/18 1801 08/02/18 0155 08/02/18 0511  TROPONINI 0.06* 0.06* 0.06* 0.06*    BNP: BNP (last 3 results) Recent Labs    01/07/18 1530 01/21/18 1403 08/01/18 1259  BNP 1,784.5* 992.3* 1,083.3*    ProBNP (last 3 results) No results for input(s): PROBNP in the last 8760 hours.    Other results:  Imaging: Vas [place] Abi With/wo Tbi  Result Date: 08/04/2018 LOWER EXTREMITY DOPPLER STUDY Indications: Claudication, and ulceration.  Performing Technologist: Abram Sander RVS  Examination Guidelines: A complete evaluation includes at minimum, Doppler waveform signals and systolic blood pressure reading at the level of bilateral brachial, anterior tibial, and  posterior tibial arteries, when vessel segments are accessible. Bilateral testing is considered an integral part of a complete examination. Photoelectric Plethysmograph (PPG) waveforms and toe systolic pressure readings are included as required and additional duplex testing as needed. Limited examinations for reoccurring indications may be performed as noted.  ABI Findings: +--------+------------------+-----+---------+---------+ Right   Rt Pressure (mmHg)IndexWaveform Comment   +--------+------------------+-----+---------+---------+ Brachial                       triphasicPICC Line +--------+------------------+-----+---------+---------+ PTA     116               0.91 biphasic           +--------+------------------+-----+---------+---------+ DP      127               1.00 biphasic           +--------+------------------+-----+---------+---------+ +--------+------------------+-----+---------+-------+ Left    Lt Pressure (mmHg)IndexWaveform Comment +--------+------------------+-----+---------+-------+ [id number]                    triphasic        +--------+------------------+-----+---------+-------+ PTA     117               0.92 biphasic         +--------+------------------+-----+---------+-------+ DP      101               0.80 biphasic         +--------+------------------+-----+---------+-------+ +-------+-----------+-----------+------------+------------+ ABI/TBIToday's ABIToday's TBIPrevious ABIPrevious TBI +-------+-----------+-----------+------------+------------+ Right  1.00       0.42                                +-------+-----------+-----------+------------+------------+ Left   0.92       0.44                                +-------+-----------+-----------+------------+------------+  Summary: Right: Resting right ankle-brachial index is within normal range. No evidence of significant right lower extremity arterial disease. Left: Resting left  ankle-brachial index indicates mild left lower extremity arterial disease.  *See table(s) above for measurements and observations.  Electronically signed by Deitra Mayo MD on 08/04/2018 at 5:10:14 PM.   Final      Medications:     Scheduled Medications: . allopurinol  600 mg Oral Daily  . aspirin EC  81  mg Oral Daily  . carvedilol  3.125 mg Oral BID WC  . digoxin  0.125 mg Oral Daily  . enoxaparin (LOVENOX) injection  40 mg Subcutaneous Q24H  . insulin aspart  0-15 Units Subcutaneous TID WC  . insulin aspart  0-5 Units Subcutaneous QHS  . insulin aspart  5 Units Subcutaneous TID WC  . rosuvastatin  40 mg Oral q1800  . sacubitril-valsartan  1 tablet Oral BID  . sodium chloride flush  10-40 mL Intracatheter Q12H  . spironolactone  25 mg Oral Daily  . torsemide  100 mg Oral BID    Infusions: . sodium chloride    .  ceFAZolin (ANCEF) IV 1 g (08/06/18 0530)    PRN Medications: sodium chloride, acetaminophen, ondansetron (ZOFRAN) IV, sodium chloride flush   Assessment:   Mr. Betsch is a29 y.o.male with h/o DM2, CAD s/p CABG 1610, systolic HF due to ischemic cardiomyopathy with EF 20-25% (echo 12/15), DM2 and CKD. He is s/p Medtronic single chamber ICD.  Presented to [facility] on 12/27 with 30+ lb weight gain.    Plan/Discussion:     1. Acute on chronic systolic HF: ICM.Has Medtronic ICD. Narrow QRS, not CRT candidate.  Has required milrinone multiple times in the past for low output HF. Offered advanced therapies in past but declined. Echo 6/19 with EF 20% and moderately decreased RV systolic function. CPX 4/19 with severe functional limitation due to HF. RHC 6/19 showed low output and coronary angiography showed no interventional targets. Echo 08/04/18: EF 20-25%, mild LVH, diffuse HK, akinesis of anteroseptal and anterior myocardium, grade 2 DD, trivial AI, LA mod dilated, RV moderately dilated, RA mildly dilated, moderate TR, PA peak pressure 49 mm Hg.  He has been  diuresed in the hospital, weight down about 38 lbs.  Creatinine up to 1.89.  CVP 7-8 this morning, volume status much improved.  - Hold torsemide this morning, resume torsemide 100 mg bid this evening.  - Continue Entresto 49/51 mg BID - Continue spironolactone 25 mg daily.  -Intolerant ivabradine due to nausea. - Continue Coreg 3.125 mg BID - Continue digoxin 0.125 mg daily, level ok this admission.  - Has been very resistant to LVAD/transplant though evaluation for both offered in past.  Now with left foot osteomyelitis, will need treatment/resolution before considering LVAD even if he would agree to evaluation.  2. CAD s/p CABG 2002:  Last cath in 6/19 with patent grafts.No chest pain.  - continue ASA, statin  3. AKI on CKD Stage 3: Mild rise in creatinine to 1.89.  Baseline creatinine 1.3-1.5 - Hold am diuretic.  4. Left foot wound: MSSA osteomyelitis. CT left foot 07/07/18: suspicion for early osteomyelitis involving the base of the 5th metatarsal.  Followed by wound clinic. ESR 22.  WOC saw 12/29. Ordered daily dressing changes. ABIs 12/30: RLE normal, LLE mild lower extremity arterial disease - ID consulted and started him on Ancef. Will need IV abx for home x 42 days. Has PICC. Needs home antibiotic infusion arranged.   5. DM: Lantus DCd per DM coordinator recommendations. A1C 6.0 this am.   - He tells me that he takes lantus most of the time, but rarely takes his novolog at home. PCP manages outpatient.  6. NSVT: 1 run noted in hospital, Coreg restarted.  7. Disposition: I think that he can go home today.  Will need very close followup in CHF clinic (within 1 week), suspect he will need periodic metolazone.  He will need [place]  for home antibiotics. He needs followup appt with ID.  Meds for home: Coreg 6.25 mg bid (resume home dose), Entresto 49/51 bid, spironolactone 25 daily, torsemide 100 mg bid, digoxin 0.125 daily, ASA 81 daily, Crestor 40 daily, cefazolin per ID recs x  6 weeks, KCl 20 daily.    Length of Stay: [place], MD 08/06/2018, 9:29 AM  Advanced Heart Failure Team Pager [phone] (M-F; 7a - 4p)  Please contact [facility] for night-coverage after hours (4p -7a ) and weekends on [email or web address]

## 2018-08-06 NOTE — Progress Notes (Signed)
[facility] Infusion Coordinator met with pt on 12/31 regarding plans for DC home with IVABX/HF management.  Pt states his son will be his primary  support/CG at DC and with the IV ABX at home.  Pt states his brother will also support as needed.  Per pt his son can be at the hospital for teaching regarding IV ABX administration at 11 AM on 08/07/18.  I will see pt at that time and work with son for independence with IVABX.  If patient discharges after hours, please call [phone].   Larry Sierras 08/06/2018, 8:40 AM

## 2018-08-07 DIAGNOSIS — L03119 Cellulitis of unspecified part of limb: Secondary | ICD-10-CM | POA: Diagnosis not present

## 2018-08-07 DIAGNOSIS — M86172 Other acute osteomyelitis, left ankle and foot: Secondary | ICD-10-CM | POA: Diagnosis not present

## 2018-08-07 DIAGNOSIS — E1169 Type 2 diabetes mellitus with other specified complication: Secondary | ICD-10-CM | POA: Diagnosis not present

## 2018-08-07 DIAGNOSIS — I5020 Unspecified systolic (congestive) heart failure: Secondary | ICD-10-CM | POA: Diagnosis not present

## 2018-08-07 DIAGNOSIS — A4902 Methicillin resistant Staphylococcus aureus infection, unspecified site: Secondary | ICD-10-CM | POA: Diagnosis not present

## 2018-08-07 LAB — COOXEMETRY PANEL
CARBOXYHEMOGLOBIN: 2 % — AB (ref 0.5–1.5)
METHEMOGLOBIN: 0.8 % (ref 0.0–1.5)
O2 SAT: 60.8 %
Total hemoglobin: 13.4 g/dL (ref 12.0–16.0)

## 2018-08-07 LAB — BASIC METABOLIC PANEL
Anion gap: 12 (ref 5–15)
BUN: 47 mg/dL — ABNORMAL HIGH (ref 6–20)
CALCIUM: 8.8 mg/dL — AB (ref 8.9–10.3)
CO2: 28 mmol/L (ref 22–32)
Chloride: 98 mmol/L (ref 98–111)
Creatinine, Ser: 1.86 mg/dL — ABNORMAL HIGH (ref 0.61–1.24)
GFR calc Af Amer: 46 mL/min — ABNORMAL LOW (ref >60)
GFR calc non Af Amer: 39 mL/min — ABNORMAL LOW (ref >60)
GLUCOSE: 117 mg/dL — AB (ref 70–99)
Potassium: 4.3 mmol/L (ref 3.5–5.1)
Sodium: 138 mmol/L (ref 135–145)

## 2018-08-07 LAB — GLUCOSE, CAPILLARY: Glucose-Capillary: 121 mg/dL — ABNORMAL HIGH (ref 70–99)

## 2018-08-07 MED ORDER — HEPARIN SOD (PORK) LOCK FLUSH 100 UNIT/ML IV SOLN
250.0000 [IU] | INTRAVENOUS | Status: AC | PRN
  Administered 2018-08-07: 12:00:00

## 2018-08-07 MED ORDER — CARVEDILOL 6.25 MG PO TABS
6.2500 mg | ORAL_TABLET | Freq: Two times a day (BID) | ORAL | Status: DC
  Administered 2018-08-07: 6.25 mg via ORAL
  Filled 2018-08-07: qty 1

## 2018-08-07 MED ORDER — TORSEMIDE 20 MG PO TABS: 100.0000 mg | ORAL_TABLET | Freq: Two times a day (BID) | ORAL | Status: DC

## 2018-08-07 NOTE — Care Management Note (Signed)
Case Management Note Marcus Gibbons RN, BSN Transitions of Care Unit 4E- RN Case Manager [phone]  Patient Details  Name: Marcus Johnson MRN: [id number] Date of Birth: 05-02-1961  Subjective/Objective:    Pt admitted with HF with massive volume overload and LE wounds               Action/Plan: PTA pt lived at home per ID pt will need IV abx until 09/15/18- will need HHRN orders and OPAT for iv abx needs- pt has PICC in place- CM spoke with pt at bedside to discuss transition of care needs- per pt he has brother and son that can assist him with his IV abx needs at home. CM provided [facility] agency list per CMS gov site with star ratings- per pt he has used [facility] in past for IV abx- has chosen to use them again for IV abx and HH needs- orders pending- CM has spoken with Pam at [facility] regarding home IV abx needs- referral accepted. CM will continue to follow  Expected Discharge Date:  08/07/18               Expected Discharge Plan:  [place]  In-House Referral:  NA  Discharge planning Services  CM Consult  Post Acute Care Choice:  Home Health Choice offered to:  Patient  DME Arranged:    DME Agency:     HH Arranged:  RN, IV Antibiotics HH Agency:  [place]  Status of Service:  Completed, signed off  If discussed at [place] of Stay Meetings, dates discussed:    Discharge Disposition: home/home health   Additional Comments:  08/07/18- 1000- Marcus Nebergall RN, CM- per Pam with [facility]- pt and son to have teaching done today at 11am- [facility] including [facility] per Pam. No further CM needs noted.   Dawayne Patricia, RN 08/07/2018, 12:04 PM

## 2018-08-07 NOTE — Progress Notes (Signed)
08/07/2018 12:31 PM Discharge AVS meds taken today and those due this evening reviewed.  Follow-up appointments and when to call md reviewed.  D/C IV and TELE.  Questions and concerns addressed.   D/C home per orders. [place]

## 2018-08-07 NOTE — Progress Notes (Addendum)
Patient ID: Marcus Johnson, male   DOB: October 10, 1960, 58 y.o.   MRN: [id number]    Advanced Heart Failure Rounding Note   Subjective:    Admitted 12/27 with massive volume overload and LE wounds.   Torsemide held yesterday. Weight unchanged today, but down 38 lbs total. CVP 8-9. Creatinine 1.89 > 1.86. Coox 61%.  ID consulted for left foot wound and started him on IV ancef q8 hours x 42 days. He has PICC. Carolynn Sayers with [facility] to do teaching with son today.   Denies CP, SOB, orthopnea. He is a little dizzy when he is up moving around.   ABIs 12/30: RLE normal, LLE mild lower extremity arterial disease (L ABI 0.92).   Echo 12/30: EF 20-25%, mild LVH, diffuse HK, akinesis of anteroseptal and anterior myocardium, grade 2 DD, trivial AI, LA mod dilated, RV moderately dilated, RA mildly dilated, moderate TR, PA peak pressure 49 mm Hg  Objective:   Weight Range:  Vital Signs:   Temp:  [98.1 F (36.7 C)-99.2 F (37.3 C)] 98.8 F (37.1 C) (01/02 0446) Pulse Rate:  [94-99] 98 (01/02 0446) Resp:  [18-23] 18 (01/01 2350) BP: (86-106)/(60-78) 100/78 (01/02 0446) SpO2:  [96 %-100 %] 98 % (01/02 0446) Weight:  [90.6 kg] 90.6 kg (01/02 0446) Last BM Date: 08/04/18  Weight change: Filed Weights   08/05/18 0446 08/06/18 0504 08/07/18 0446  Weight: 92 kg 90.6 kg 90.6 kg    Intake/Output:   Intake/Output Summary (Last 24 hours) at 08/07/2018 0735 Last data filed at 08/07/2018 0528 Gross per 24 hour  Intake 850 ml  Output 1000 ml  Net -150 ml     Physical Exam: General: Well appearing. No resp difficulty. Sitting on side of bed. HEENT: Normal Neck: Supple. JVP ~8. Carotids 2+ bilat; no bruits. No thyromegaly or nodule noted. Cor: PMI laterally displaced. RRR, No M/G/R noted Lungs: CTAB, normal effort. Abdomen: Soft, non-tender, non-distended, no HSM. No bruits or masses. +BS  Extremities: No cyanosis, clubbing, or rash. RLE no edema. LLE 1+ edema. RUE PICC.  Neuro:  Alert & orientedx3, cranial nerves grossly intact. moves all 4 extremities w/o difficulty. Affect pleasant   Telemetry: NSR 90-100s. No NSVT.  Personally reviewed.     Labs: Basic Metabolic Panel: Recent Labs  Lab 08/01/18 1543  08/03/18 0433 08/04/18 0500 08/05/18 0241 08/06/18 0420 08/07/18 0500  NA  --    < > 143 140 139 138 138  K  --    < > 3.7 4.2 4.4 4.9 4.3  CL  --    < > 104 98 96* 95* 98  CO2  --    < > 31 32 32 33* 28  GLUCOSE  --    < > 84 117* 111* 145* 117*  BUN  --    < > 31* 31* 37* 46* 47*  CREATININE 1.50*   < > 1.34* 1.44* 1.74* 1.84* 1.86*  CALCIUM  --    < > 8.9 9.2 9.4 9.1 8.8*  MG 2.2  --  2.4 2.6*  --   --   --    < > = values in this interval not displayed.    Liver Function Tests: No results for input(s): AST, ALT, ALKPHOS, BILITOT, PROT, ALBUMIN in the last 168 hours. No results for input(s): LIPASE, AMYLASE in the last 168 hours. No results for input(s): AMMONIA in the last 168 hours.  CBC: Recent Labs  Lab 08/01/18 1251  08/02/18 0511  08/03/18 0433 08/04/18 0500 08/05/18 0241 08/06/18 0420  WBC 8.3   < > 7.4 8.2 8.1 8.9 8.3  NEUTROABS 5.6  --   --   --   --   --   --   HGB 11.5*   < > 10.9* 11.5* 11.8* 12.6* 13.4  HCT 37.7*   < > 36.4* 38.6* 39.5 42.0 43.3  MCV 87.1   < > 87.1 86.9 85.9 84.3 84.7  PLT 245   < > 221 250 276 296 328   < > = values in this interval not displayed.    Cardiac Enzymes: Recent Labs  Lab 08/01/18 1543 08/01/18 1801 08/02/18 0155 08/02/18 0511  TROPONINI 0.06* 0.06* 0.06* 0.06*    BNP: BNP (last 3 results) Recent Labs    01/07/18 1530 01/21/18 1403 08/01/18 1259  BNP 1,784.5* 992.3* 1,083.3*    ProBNP (last 3 results) No results for input(s): PROBNP in the last 8760 hours.    Other results:  Imaging: No results found.   Medications:     Scheduled Medications: . allopurinol  600 mg Oral Daily  . aspirin EC  81 mg Oral Daily  . carvedilol  3.125 mg Oral BID WC  . digoxin  0.125  mg Oral Daily  . enoxaparin (LOVENOX) injection  40 mg Subcutaneous Q24H  . insulin aspart  0-15 Units Subcutaneous TID WC  . insulin aspart  0-5 Units Subcutaneous QHS  . insulin aspart  5 Units Subcutaneous TID WC  . rosuvastatin  40 mg Oral q1800  . sacubitril-valsartan  1 tablet Oral BID  . sodium chloride flush  10-40 mL Intracatheter Q12H  . spironolactone  25 mg Oral Daily  . torsemide  100 mg Oral BID    Infusions: . sodium chloride    .  ceFAZolin (ANCEF) IV 1 g (08/07/18 6712)    PRN Medications: sodium chloride, acetaminophen, ondansetron (ZOFRAN) IV, sodium chloride flush   Assessment:   Mr. Fedewa is a71 y.o.male with h/o DM2, CAD s/p CABG 4580, systolic HF due to ischemic cardiomyopathy with EF 20-25% (echo 12/15), DM2 and CKD. He is s/p Medtronic single chamber ICD.  [facility] to [facility] 12/27 with 30+ lb weight gain.    Plan/Discussion:     1. Acute on chronic systolic HF: ICM.Has Medtronic ICD. Narrow QRS, not CRT candidate.  Has required milrinone multiple times in the past for low output HF. Offered advanced therapies in past but declined. Echo 6/19 with EF 20% and moderately decreased RV systolic function. CPX 4/19 with severe functional limitation due to HF. RHC 6/19 showed low output and coronary angiography showed no interventional targets. Echo 08/04/18: EF 20-25%, mild LVH, diffuse HK, akinesis of anteroseptal and anterior myocardium, grade 2 DD, trivial AI, LA mod dilated, RV moderately dilated, RA mildly dilated, moderate TR, PA peak pressure 49 mm Hg.   - Volume much improved. Down 38 lbs total. Creatinine still up 1.86. CVP 8-9. He is dizzy when getting up and urine is dark at bedside. - Hold am dose of torsemide again this am, then continue torsemide 100 mg bid - Continue Entresto 49/51 mg BID - Continue spironolactone 25 mg daily.  -Intolerant ivabradine due to nausea. - Increase coreg back to 6.25 mg BID (home dose) - Continue digoxin  0.125 mg daily, level ok this admission.  - Has been very resistant to LVAD/transplant though evaluation for both offered in past.  Now with left foot osteomyelitis, will need treatment/resolution before considering LVAD even  if he would agree to evaluation.  2. CAD s/p CABG 2002:  Last cath in 6/19 with patent grafts.No chest pain.  - continue ASA, statin  3. AKI on CKD Stage 3: Creatinine still up from baseline, 1.86 today but trending down.  Baseline creatinine 1.3-1.5. Hold am dose of torsemide today.  4. Left foot wound: MSSA osteomyelitis. CT left foot 07/07/18: suspicion for early osteomyelitis involving the base of the 5th metatarsal.  Followed by wound clinic. ESR 22.  WOC saw 12/29. Ordered daily dressing changes. ABIs 12/30: RLE normal, LLE mild lower extremity arterial disease - ID consulted and started him on Ancef. Will need IV abx for home x 42 days. Has PICC. Needs home antibiotic infusion arranged.  Son and brother are coming today for education.  5. DM: Lantus DCd per DM coordinator recommendations. A1C 6.0 this am.   - He tells me that he takes lantus most of the time, but rarely takes his novolog at home. PCP manages outpatient. No change.  6. NSVT: 1 run noted in hospital, Coreg restarted. None further. 7. Disposition: He can discharge today after [facility] teaching.  Will need very close followup in CHF clinic (within 1 week), suspect he will need periodic metolazone.  He will need [place] for home antibiotics. He needs followup appt with ID.  Meds for home: Coreg 6.25 mg bid (resume home dose), Entresto 49/51 bid, spironolactone 25 daily, torsemide 100 mg bid, digoxin 0.125 daily, ASA 81 daily, Crestor 40 daily, cefazolin per ID recs x 6 weeks, KCl 20 daily.    Will see if I can get him a sooner appointment in HF clinic. I will arrange follow up with ID in 1 month with Dr Johnnye Sima. His son will be here today at 11 am for IV antibiotic administration teaching. [facility] has been  following.   Length of Stay: [place], NP 08/07/2018, 7:35 AM  Advanced Heart Failure Team Pager [phone] (M-F; 7a - 4p)  Please contact [facility] for night-coverage after hours (4p -7a ) and weekends on [email or web address]  Patient seen with NP, agree with the above note.   He is stable today.  Will get discharge teaching for home IV abx and can go home today.  Meds for home as above and will need close followup.  Will need followup in ID clinic.   Loralie Champagne 08/07/2018 9:45 AM

## 2018-08-07 NOTE — Discharge Summary (Signed)
Advanced Heart Failure Discharge Note  Discharge Summary   Patient ID: Marcus Johnson MRN: [id number], DOB/AGE: 58-29-1962 40 y.o. Admit date: 08/01/2018 D/C date:     08/07/2018   Primary Discharge Diagnoses:  1. Acute on chronic systolic HF 2. CAD s/p CABG 2002 3. AKI on CKD3 4. Left foot wound: MSSA osteomyelitis - On IV ancef q8 hours x 42 days (through 09/15/18). [facility] following. 5. Diabetes 6. NSVT  Hospital Course:  Mr. Yusko is a77 y.o.male with h/o DM2, CAD s/p CABG 8016, systolic HF due to ischemic cardiomyopathy with EF 20-25% (echo 12/15), DM2 and CKD. He is s/p Medtronic single chamber ICD.  Pt was admitted 08/01/18 with massive volume overload and left foot wound. He was diuresed with IV lasix, then transitioned back to torsemide 100 mg BID. ID was consulted with concerns for left foot osteomyelitis noted on recent CT scan. He will be on IV ancef q8 hours x 42 days. [facility] provided teaching and supplies for home antibiotics.   1.Acute on chronic systolic HF:ICM.HasMedtronic ICD. Narrow QRS, not CRT candidate. Has required milrinone multiple times in the past for low output HF. Offered advanced therapies in past but declined. Echo 6/19 with EF 20% and moderately decreased RV systolic function.CPX 4/19 with severe functional limitation due to [facility] 6/19 showed low output and coronary angiography showed no interventional targets.Echo 08/04/18: EF 20-25%, mild LVH, diffuse HK, akinesis of anteroseptal and anterior myocardium, grade 2 DD, trivial AI, LA mod dilated, RV moderately dilated, RA mildly dilated, moderate TR, PA peak pressure 49 mm Hg.   - Diuresed 38 lbs with IV lasix. Transitioned to torsemide 100 mg BID.  - Continue Entresto49/51 mg BID - Continuespironolactone25 mg daily.  -Intolerant ivabradine due to nausea. - Continue coreg 6.25 mg BID - Continue digoxin0.125 mg daily.  - Has been very resistant to LVAD/transplant though evaluation for both  offered in past. Now with left foot osteomyelitis, will need treatment/resolution before considering LVAD even if he would agree to evaluation.  2. CAD s/p CABG2002:Last cath in 6/19 with patent grafts.No s/s ischemia.  - Continue ASA, statin 3. AKI on CKD Stage 3: Baseline creatinine 1.3-1.5.  Creatinine peaked at 1.89. Several doses of torsemide held. Creatinine was 1.86 on day discharge.  4.Left foot wound: MSSA osteomyelitis. CT left foot 07/07/18: suspicion for early osteomyelitis involving the base of the 5th metatarsal.  Followed by wound clinic. ESR 22.  WOC saw 12/29. Ordered daily dressing changes. ABIs 12/30: RLE normal, LLE mild lower extremity arterial disease - ID consulted and started him on Ancef. Will need IV abx for home x 42 days. Has PICC. [facility] to provide supplies and follow at home. His son and brother were taught antibiotic administration prior to discharge.  5. DM: Lantus stopped this admission per DM coordinator recommendations due to several low blood sugars. A1C 6.0 this admission. Follow up appointment made with his PCP to reassess outpatient insulin needs.  6. NSVT: 1 run noted in hospital, Coreg restarted. None further.  He will be followed closely in the HF clinic, with appointment as below. He has follow up with ID in 1 month. Follow up appointment with PCP made as well to address insulin requirements. [facility] will follow for home antibiotics and to assist with wound care. His brother and son were taught antibiotic administration prior to discharge. Pt will continue to follow with wound clinic.  Discharge Weight Range: 200 lbs Discharge Vitals: Blood pressure 118/72, pulse 94, temperature 98.8 F (37.1  C), temperature source Oral, resp. rate 18, height _0  (1.778 m), weight 90.6 kg, SpO2 98 %.  Labs: Lab Results  Component Value Date   WBC 8.3 08/06/2018   HGB 13.4 08/06/2018   HCT 43.3 08/06/2018   MCV 84.7 08/06/2018   PLT 328 08/06/2018    Recent Labs    Lab 08/07/18 0500  NA 138  K 4.3  CL 98  CO2 28  BUN 47*  CREATININE 1.86*  CALCIUM 8.8*  GLUCOSE 117*   Lab Results  Component Value Date   CHOL 93 01/08/2018   HDL 19 (L) 01/08/2018   LDLCALC 54 01/08/2018   TRIG 98 01/08/2018   BNP (last 3 results) Recent Labs    01/07/18 1530 01/21/18 1403 08/01/18 1259  BNP 1,784.5* 992.3* 1,083.3*    ProBNP (last 3 results) No results for input(s): PROBNP in the last 8760 hours.   Diagnostic Studies/Procedures   ABIs 12/30: RLE normal, LLE mild lower extremity arterial disease (L ABI 0.92).   Echo 12/30: EF 20-25%, mild LVH, diffuse HK, akinesis of anteroseptal and anterior myocardium, grade 2 DD, trivial AI, LA mod dilated, RV moderately dilated, RA mildly dilated, moderate TR, PA peak pressure 49 mm Hg  Discharge Medications   Allergies as of 08/07/2018      Reactions   Meclizine Hcl Anaphylaxis, Swelling   Ivabradine Nausea Only      Medication List    STOP taking these medications   ibuprofen 200 MG tablet Commonly known as:  ADVIL,MOTRIN   Insulin Glargine 100 UNIT/ML Solostar Pen Commonly known as:  LANTUS SOLOSTAR     TAKE these medications   allopurinol 300 MG tablet Commonly known as:  ZYLOPRIM Take 2 tablets (600 mg total) by mouth daily. What changed:    when to take this  reasons to take this   aspirin 81 MG EC tablet TAKE 1 TABLET BY MOUTH EVERY DAY   BAYER MICROLET LANCETS lancets Use as instructed   carvedilol 6.25 MG tablet Commonly known as:  COREG Take 1 tablet (6.25 mg total) by mouth 2 (two) times daily. What changed:  Another medication with the same name was removed. Continue taking this medication, and follow the directions you see here.   ceFAZolin  IVPB Commonly known as:  ANCEF Inject 1 g into the vein every 8 (eight) hours. Indication:  Osteomyelitis Last Day of Therapy:  09/15/2018 Labs - Once weekly:  CBC/D and BMP, Labs - Every other week:  ESR and CRP   digoxin  0.125 MG tablet Commonly known as:  LANOXIN Take 1 tablet (0.125 mg total) by mouth daily.   insulin aspart 100 UNIT/ML FlexPen Commonly known as:  NOVOLOG FLEXPEN Inject 5 units into the skin 3 times daily with meals.  Dx code: E11.9   Insulin Pen Needle 32G X 4 MM Misc Commonly known as:  BD PEN NEEDLE NANO U/F USE 4 TIMES DAILY   potassium chloride SA 20 MEQ tablet Commonly known as:  K-DUR,KLOR-CON Take 1 tablet (20 mEq total) by mouth daily.   rosuvastatin 40 MG tablet Commonly known as:  CRESTOR Take 1 tablet (40 mg total) by mouth daily.   sacubitril-valsartan 49-51 MG Commonly known as:  ENTRESTO Take 1 tablet by mouth 2 (two) times daily.   spironolactone 25 MG tablet Commonly known as:  ALDACTONE Take 1 tablet (25 mg total) by mouth daily.   torsemide 20 MG tablet Commonly known as:  DEMADEX TAKE 5 TABLETS BY MOUTH  TWICE A DAY What changed:  See the new instructions.            Home Infusion Instuctions  (From admission, onward)         Start     Ordered   08/05/18 0000  Home infusion instructions [facility] May follow [place] Dosing Protocol; May administer Cathflo as needed to maintain patency of vascular access device.; Flushing of vascular access device: per [facility] Protocol: 0.9% NaCl pre/post medica...    Question Answer Comment  Instructions May follow [place] Dosing Protocol   Instructions May administer Cathflo as needed to maintain patency of vascular access device.   Instructions Flushing of vascular access device: per [facility] Protocol: 0.9% NaCl pre/post medication administration and prn patency; Heparin 100 u/ml, 54m for implanted ports and Heparin 10u/ml, 538mfor all other central venous catheters.   Instructions May follow [facility] Anaphylaxis Protocol for First Dose Administration in the home: 0.9% NaCl at 25-50 ml/hr to maintain IV access for protocol meds. Epinephrine 0.3 ml IV/IM PRN and Benadryl 25-50 IV/IM PRN s/s of anaphylaxis.     Instructions [facility] Infusion Coordinator (RN) to assist per patient IV care needs in the home PRN.      08/05/18 1449           Durable Medical Equipment  (From admission, onward)         Start     Ordered   08/07/18 0000  Heart failure home health orders  (Heart failure home health orders / Face to face)    Comments:  Heart Failure Follow-up Care:  Verify follow-up appointments per Patient Discharge Instructions. Confirm transportation arranged. Reconcile home medications with discharge medication list. Remove discontinued medications from use. Assist patient/caregiver to manage medications using pill box. Reinforce low sodium food selection Assessments: Vital signs and oxygen saturation at each visit. Assess home environment for safety concerns, caregiver support and availability of low-sodium foods. Consult SoEducation officer, museumPT/OT, Dietitian, and CNA based on assessments. Perform comprehensive cardiopulmonary assessment. Notify MD for any change in condition or weight gain of 3 pounds in one day or 5 pounds in one week with symptoms. Daily Weights and Symptom Monitoring: Ensure patient has access to scales. Teach patient/caregiver to weigh daily before breakfast and after voiding using same scale and record.    Teach patient/caregiver to track weight and symptoms and when to notify Provider. Activity: Develop individualized activity plan with patient/caregiver.  HHRN 2 wk 2 for CP assessment. Please fax requested labwork to ID as previously ordered.   Please assist with daily dressing changes to left foot: Daily dressing change using silver hydrofiber as an antimicrobial topical agent after cleansing with soap and water and rinsing with NS.  Question Answer Comment  Heart Failure Follow-up Care Or per Doctor (see comments)   Obtain the following labs Other see comments   Lab frequency Other see comments   Fax lab results to [facility] at [phone] Diet Low  Sodium Heart Healthy   Fluid restrictions: 2000 mL Fluid   Initiate Heart Failure Clinic Diuretic Protocol to be used by [facility] ( to be ordered by Heart Failure Team Providers Only) Yes      08/07/18 1037           Discharge Care Instructions  (From admission, onward)         Start     Ordered   08/07/18 0000  Discharge wound care:  Comments:  Daily dressing change using silver hydrofiber as an antimicrobial topical agent after cleansing with soap and water and rinsing with NS. Keep appointment with wound care clinic.   08/07/18 1037          Disposition   The patient will be discharged in stable condition to home. Discharge Instructions    (HEART FAILURE PATIENTS) Call MD:  Anytime you have any of the following symptoms: 1) 3 pound weight gain in 24 hours or 5 pounds in 1 week 2) shortness of breath, with or without a dry hacking cough 3) swelling in the hands, feet or stomach 4) if you have to sleep on extra pillows at night in order to breathe.   Complete by:  As directed    Call MD for:  persistant dizziness or light-headedness   Complete by:  As directed    Call MD for:  severe uncontrolled pain   Complete by:  As directed    Call MD for:  temperature >100.4   Complete by:  As directed    Diet - low sodium heart healthy   Complete by:  As directed    Discharge wound care:   Complete by:  As directed    Daily dressing change using silver hydrofiber as an antimicrobial topical agent after cleansing with soap and water and rinsing with NS. Keep appointment with wound care clinic.   Face-to-face encounter (required for Medicare/Medicaid patients)   Complete by:  As directed    I Georgiana Shore certify that this patient is under my care and that I, or a nurse practitioner or physician's assistant working with me, had a face-to-face encounter that meets the physician face-to-face encounter requirements with this patient on 08/07/2018. The encounter  with the patient was in whole, or in part for the following medical condition(s) which is the primary reason for home health care (List medical condition): systolic heart failure, osteomyelitis   The encounter with the patient was in whole, or in part, for the following medical condition, which is the primary reason for home health care:  systolic heart failure, osteomyelitis   I certify that, based on my findings, the following services are medically necessary home health services:  Nursing   Reason for Medically Necessary Home Health Services:   Skilled Nursing- Administration and Training of Injectable Medication Skilled Nursing- Complex Wound Care     My clinical findings support the need for the above services:  Shortness of breath with activity   Further, I certify that my clinical findings support that this patient is homebound due to:  Shortness of Breath with activity   Heart Failure patients record your daily weight using the same scale at the same time of day   Complete by:  As directed    Heart failure home health orders   Complete by:  As directed    Heart Failure Follow-up Care:  Verify follow-up appointments per Patient Discharge Instructions. Confirm transportation arranged. Reconcile home medications with discharge medication list. Remove discontinued medications from use. Assist patient/caregiver to manage medications using pill box. Reinforce low sodium food selection Assessments: Vital signs and oxygen saturation at each visit. Assess home environment for safety concerns, caregiver support and availability of low-sodium foods. Consult Education officer, museum, PT/OT, Dietitian, and CNA based on assessments. Perform comprehensive cardiopulmonary assessment. Notify MD for any change in condition or weight gain of 3 pounds in one day or 5 pounds in one week with symptoms. Daily Weights and Symptom Monitoring: Ensure  patient has access to scales. Teach patient/caregiver to weigh daily  before breakfast and after voiding using same scale and record.    Teach patient/caregiver to track weight and symptoms and when to notify Provider. Activity: Develop individualized activity plan with patient/caregiver.  HHRN 2 wk 2 for CP assessment. Please fax requested labwork to ID as previously ordered.   Please assist with daily dressing changes to left foot: Daily dressing change using silver hydrofiber as an antimicrobial topical agent after cleansing with soap and water and rinsing with NS.   Heart Failure Follow-up Care:  Or per Doctor (see comments)   Obtain the following labs:  Other see comments   Lab frequency:  Other see comments   Fax lab results to:  [facility] at [phone]   Diet:  Low Sodium Heart Healthy   Fluid restrictions:  2000 mL Fluid   Initiate Heart Failure Clinic Diuretic Protocol to be used by [place] only ( to be ordered by Heart Failure Team Providers Only):  Yes   Home infusion instructions [place] May follow [place] Dosing Protocol; May administer Cathflo as needed to maintain patency of vascular access device.; Flushing of vascular access device: per [facility] Protocol: 0.9% NaCl pre/post medica...   Complete by:  As directed    Instructions:  May follow [place] Dosing Protocol   Instructions:  May administer Cathflo as needed to maintain patency of vascular access device.   Instructions:  Flushing of vascular access device: per [facility] Protocol: 0.9% NaCl pre/post medication administration and prn patency; Heparin 100 u/ml, 43m for implanted ports and Heparin 10u/ml, 515mfor all other central venous catheters.   Instructions:  May follow [facility] Anaphylaxis Protocol for First Dose Administration in the home: 0.9% NaCl at 25-50 ml/hr to maintain IV access for protocol meds. Epinephrine 0.3 ml IV/IM PRN and Benadryl 25-50 IV/IM PRN s/s of anaphylaxis.   Instructions:  [facility] Coordinator (RN) to assist per patient  IV care needs in the home PRN.   Increase activity slowly   Complete by:  As directed    STOP any activity that causes chest pain, shortness of breath, dizziness, sweating, or exessive weakness   Complete by:  As directed      Follow-up Information    [facility] [place] [place] [facility].   Specialty:  Cardiology Why:  at 2:30 pm in the [facility] all medications to appt.  Gate code is [id number] January. Contact information: [address][phone]     [facility] Follow up.   Specialty:  Home Health Services Contact information: [phone]        [facility] RichesMD Follow up on 08/28/2018.   Specialty:  Infectious Diseases Why:  3:30 pm  Contact information: [address] [place] [phone]        FrLaurey MoraleMD Follow up on 08/11/2018.   Specialty:  Family Medicine Why:  8 am. Follow up with PCP to discuss lantus. Arrive 15 minutes early for check in.  Contact information: [address] [phone]           Duration of Discharge Encounter: Greater than 35 minutes   Signed, AsGeorgiana ShoreNP 08/07/2018, 10:38 AM

## 2018-08-08 ENCOUNTER — Encounter (HOSPITAL_BASED_OUTPATIENT_CLINIC_OR_DEPARTMENT_OTHER): Payer: BLUE CROSS/BLUE SHIELD | Attending: Internal Medicine

## 2018-08-08 DIAGNOSIS — L97522 Non-pressure chronic ulcer of other part of left foot with fat layer exposed: Secondary | ICD-10-CM | POA: Insufficient documentation

## 2018-08-08 DIAGNOSIS — I5022 Chronic systolic (congestive) heart failure: Secondary | ICD-10-CM | POA: Insufficient documentation

## 2018-08-08 DIAGNOSIS — E1169 Type 2 diabetes mellitus with other specified complication: Secondary | ICD-10-CM | POA: Insufficient documentation

## 2018-08-08 DIAGNOSIS — T373X5A Adverse effect of other antiprotozoal drugs, initial encounter: Secondary | ICD-10-CM | POA: Insufficient documentation

## 2018-08-08 DIAGNOSIS — B9561 Methicillin susceptible Staphylococcus aureus infection as the cause of diseases classified elsewhere: Secondary | ICD-10-CM | POA: Insufficient documentation

## 2018-08-08 DIAGNOSIS — I2510 Atherosclerotic heart disease of native coronary artery without angina pectoris: Secondary | ICD-10-CM | POA: Insufficient documentation

## 2018-08-08 DIAGNOSIS — I255 Ischemic cardiomyopathy: Secondary | ICD-10-CM | POA: Insufficient documentation

## 2018-08-08 DIAGNOSIS — I110 Hypertensive heart disease with heart failure: Secondary | ICD-10-CM | POA: Insufficient documentation

## 2018-08-08 DIAGNOSIS — B9689 Other specified bacterial agents as the cause of diseases classified elsewhere: Secondary | ICD-10-CM | POA: Insufficient documentation

## 2018-08-08 DIAGNOSIS — B954 Other streptococcus as the cause of diseases classified elsewhere: Secondary | ICD-10-CM | POA: Insufficient documentation

## 2018-08-08 DIAGNOSIS — E11621 Type 2 diabetes mellitus with foot ulcer: Secondary | ICD-10-CM | POA: Insufficient documentation

## 2018-08-08 DIAGNOSIS — H5462 Unqualified visual loss, left eye, normal vision right eye: Secondary | ICD-10-CM | POA: Insufficient documentation

## 2018-08-08 DIAGNOSIS — R112 Nausea with vomiting, unspecified: Secondary | ICD-10-CM | POA: Insufficient documentation

## 2018-08-08 DIAGNOSIS — M86372 Chronic multifocal osteomyelitis, left ankle and foot: Secondary | ICD-10-CM | POA: Insufficient documentation

## 2018-08-08 DIAGNOSIS — E1140 Type 2 diabetes mellitus with diabetic neuropathy, unspecified: Secondary | ICD-10-CM | POA: Insufficient documentation

## 2018-08-11 ENCOUNTER — Encounter: Payer: Self-pay | Admitting: Family Medicine

## 2018-08-11 ENCOUNTER — Ambulatory Visit: Payer: BLUE CROSS/BLUE SHIELD | Admitting: Family Medicine

## 2018-08-11 VITALS — BP 122/66 | HR 92 | Temp 98.2°F | Wt 197.5 lb

## 2018-08-11 DIAGNOSIS — Z5181 Encounter for therapeutic drug level monitoring: Secondary | ICD-10-CM | POA: Diagnosis not present

## 2018-08-11 DIAGNOSIS — M869 Osteomyelitis, unspecified: Secondary | ICD-10-CM | POA: Insufficient documentation

## 2018-08-11 DIAGNOSIS — I255 Ischemic cardiomyopathy: Secondary | ICD-10-CM | POA: Diagnosis not present

## 2018-08-11 DIAGNOSIS — Z23 Encounter for immunization: Secondary | ICD-10-CM | POA: Diagnosis not present

## 2018-08-11 DIAGNOSIS — I10 Essential (primary) hypertension: Secondary | ICD-10-CM

## 2018-08-11 DIAGNOSIS — E1122 Type 2 diabetes mellitus with diabetic chronic kidney disease: Secondary | ICD-10-CM

## 2018-08-11 DIAGNOSIS — Z794 Long term (current) use of insulin: Secondary | ICD-10-CM

## 2018-08-11 DIAGNOSIS — I5022 Chronic systolic (congestive) heart failure: Secondary | ICD-10-CM | POA: Diagnosis not present

## 2018-08-11 DIAGNOSIS — E1169 Type 2 diabetes mellitus with other specified complication: Secondary | ICD-10-CM | POA: Diagnosis not present

## 2018-08-11 DIAGNOSIS — R600 Localized edema: Secondary | ICD-10-CM | POA: Diagnosis not present

## 2018-08-11 DIAGNOSIS — N183 Chronic kidney disease, stage 3 unspecified: Secondary | ICD-10-CM

## 2018-08-11 NOTE — Progress Notes (Signed)
   Subjective:    Patient ID: Marcus Johnson, male    DOB: 1961/05/07, 58 y.o.   MRN: [id number]  HPI Here to follow up a hospital stay from 08-01-18 to 08-07-18 for acute on chronic systolic heart failure and a left foot infection. His EF was 20-25% and he was diuresed 38 lbs of fluid with Lasix. He was sent home on Torsemide 100 mg bid. His A1c was 6.0, so his Lantus was stopped. He continues on Novolog 5 units TID. He was found to have MSSA osteomyelitis in the left 5th metatarsal. Infectious Disease has been taking care of this, and he has a PICC line in place. He is set to receive 8 weeks of treatment with Ancef. He feels much better now. He denies any SOB. The pain in the left foot is minimal. His BP is stable. He is still trying to work some, but he will limit this to 2-3 hours a day.    Review of Systems  Constitutional: Negative.   Respiratory: Negative.   Cardiovascular: Positive for leg swelling. Negative for chest pain and palpitations.  Neurological: Negative.        Objective:   Physical Exam Constitutional:      Appearance: Normal appearance.  Cardiovascular:     Rate and Rhythm: Normal rate and regular rhythm.     Pulses: Normal pulses.     Heart sounds: Normal heart sounds.  Pulmonary:     Effort: Pulmonary effort is normal.     Breath sounds: Normal breath sounds.  Musculoskeletal:     Comments: 1+ edema to the left lower leg, no edema in the right leg   Neurological:     General: No focal deficit present.     Mental Status: He is alert and oriented to person, place, and time.           Assessment & Plan:  He is doing well on IV Ancef for osteomyelitis in the left foot. He is followed closely but the Wound Clinic. He will see Dr. Johnnye Sima in [employer] on 08-28-18. His CHF is fairly well controlled at the moment. He will have labs drawn tomorrow, and we will closely monitor his renal function and his potassium levels. He will see Cardiology on 08-13-18. His diabetes is  stable. He is given a flu shot today.  Alysia Penna, MD

## 2018-08-12 ENCOUNTER — Ambulatory Visit: Payer: BLUE CROSS/BLUE SHIELD | Admitting: Internal Medicine

## 2018-08-12 NOTE — Progress Notes (Signed)
Patient ID: Marcus Johnson, male   DOB: 1960-12-24, 58 y.o.   MRN: [id number] PCP: Dr. Sarajane Jews Cardiology: Dr. Aundra Dubin  HPI: [name] is a 58 y.o. male with h/o DM2, CAD s/p CABG 6720, systolic HF due to ischemic cardiomyopathy with EF 20-25% (echo 12/15), DM2 and CKD. He is s/p Medtronic single chamber ICD.  Admitted in 12/15 due to ADHF. Required short course milrinone for diuresis. Diuresed 30 pounds.   CPX 2/16 showed severely reduced functional capacity.  There was a significant disconnect between his symptoms and the CPX.  RHC in 6/16 showed fairly normal filling pressures and low but not markedly low cardiac output.   CPX (4/19) was submaximal but suggested severe limitation from HF.   He was admitted in 6/19 with marked volume overload and dyspnea.  Echo in 6/19 showed EF 20% with severe global hypokinesis.  LHC/RHC showed severe native vessel CAD with patent LIMA-D, SVG-OM, and SVG-PLV; cardiac index low.  He was started on milrinone and diuresed.  We discussed LVAD extensively in light of low output, creatinine that is trending up and concern for decompensation of RV over time. He was adamant that he did not want to pursue LVAD workup yet and did not want a referral for transplant evaluation. Milrinone was weaned off and he was discharged home.   Admitted 08/01/18-08/07/18 with volume overload and left foot wound. Diuresed 38 lbs with IV lasix and then transitioned back to torsemide 100 mg BID. ID was consulted with concerns for left foot osteomyelitis noted on recent CT scan. He will be on IV ancef q8 hours x 42 days. [facility] provided teaching and supplies for home antibiotics. Course complicated by AKI. DC weight 200 lbs.   He returns today for post hospital follow up. Overall doing well. His main complaint is left foot pain. Denies SOB. Activity limited due to foot pain. Denies orthopnea, PND, or edema. No CP or dizziness. No problems with PICC line. No fever/chills/drainage. Appetite and energy  level okay. Weights are down to 197 lbs at home. Taking all medications. EKG today shows new Afib.   [facility] labs 08/11/18: creatinine 1.59, K 4.2  Optivol: Thoracic impedence above threshold. <1 hour activity. No VT/VF. HR has trended up since early January.   Studies: Echo (12/15): EF 20-25% RV moderately HK CPX (2/16): RER 1.10, peak VO2 8.6, VE/VCO2 slope 43.7 => severely reduced functional capacity.  RHC (6/16): mean RA 8, PA 48/19 mean 30, mean PCWP 14, CI 2.03 Fick/2.28 Thermo, PVR 3.6.  Echo (5/17): EF 25%, mild LV dilation, moderate diastolic dysfunction, normal RV size with mild to moderate systolic dysfunction, mild mitral regurgitation CPX (4/19): Peak VO2 7, VE/VCO2 slope 41, RER 1.02 => submaximal but still suggestive of severe HF limitation.  Echo (6/19): EF 20%, diffuse severe HK, moderately decreased RV systolic function, moderate TR.  LHC/RHC (6/19): Severe native vessel CAD with patent LIMA-D, SVG-OM, and SVG-PLV; mean RA 15, PA 54/24 mean 37, mean PCWP 32, CI 1.86 Fick/1.64 thermo. PAPi 2, PVR 1.25 WU.   Labs:  8/15 LDL 88 12/15 K+ 4.2 creatinine 1.09  1/16 K 4.3, creatinine 1.34 4/16 K 4.5, creatinine 1.19 3/17 K 3.8, creatinine 1.22, HCT 35.3, BNP 1597 5/17 K 4.1, creatinine 1.25 6/18 LDL 138, K 4.4, creatinine 1.5 6/19 hgb 13.5, K 4.9, creatinine 1.53 => 1.5 => 1.4, BNP 992  Review of systems complete and found to be negative unless listed in HPI.   SH:  Social History   Socioeconomic  History  . Marital status: Divorced    Spouse name: Not on file  . Number of children: Not on file  . Years of education: Not on file  . Highest education level: Not on file  Occupational History  . Not on file  Social Needs  . Financial resource strain: Not on file  . Food insecurity:    Worry: Not on file    Inability: Not on file  . Transportation needs:    Medical: Not on file    Non-medical: Not on file  Tobacco Use  . Smoking status: Never Smoker  . Smokeless  tobacco: Never Used  Substance and Sexual Activity  . Alcohol use: No    Alcohol/week: 0.0 standard drinks  . Drug use: No  . Sexual activity: Not on file  Lifestyle  . Physical activity:    Days per week: Not on file    Minutes per session: Not on file  . Stress: Not on file  Relationships  . Social connections:    Talks on phone: Not on file    Gets together: Not on file    Attends religious service: Not on file    Active member of club or organization: Not on file    Attends meetings of clubs or organizations: Not on file    Relationship status: Not on file  . Intimate partner violence:    Fear of current or ex partner: Not on file    Emotionally abused: Not on file    Physically abused: Not on file    Forced sexual activity: Not on file  Other Topics Concern  . Not on file  Social History Narrative   Lives in [place] with his son and his family.  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    FH:  Family History  Problem Relation Age of Onset  . Diabetes Father        died in his 62's  . Hypertension Mother        died in her 55's - had a ppm.    Past Medical History:  Diagnosis Date  . AICD (automatic cardioverter/defibrillator) present    Single-chamber  implantable cardiac defibrillator - Medtronic  . CHF (congestive heart failure) ([place])   . Chronic systolic heart failure ([place])    a. Echo 5/13: Mild LVE, mild LVH, EF 10%, anteroseptal, lateral, apical AK, mild MR, mild LAE, moderately reduced RVSF, mild RAE, PASP 60;  b. 07/2014 Echo: EF 20-2%, diff HK, AKI of antsep/apical/mid-apicalinferior, mod reduced RV.  Marland Kitchen Coronary artery disease    a. s/p CABG 2002 b. LHC 5/13:  dLM 80%, LAD subtotally occluded, pCFX occluded, pRCA 50%, mid? Occlusion with high take off of the PDA with 70% multiple lesions-not bypassed and supplies collaterals to LAD, LIMA-IM/ramus ok, S-OM ok, S-PLA branches ok. Medical therapy was recommended  . Gout    "on  daily RX" (01/08/2018)  . Hypertension   . Ischemic cardiomyopathy    a. Echo 5/13: Mild LVE, mild LVH, EF 10%, anteroseptal, lateral, apical AK, mild MR, mild LAE, moderately reduced RVSF, mild RAE, PASP 60;  b. 01/2012 s/p MDT [id number] Protecta XT VR AICD;  c. 07/2014 Echo: EF 20-2%, diff HK, AKI of antsep/apical/mid-apicalinferior, mod reduced RV.  Marland Kitchen MRSA (methicillin resistant Staphylococcus aureus)    Status post right foot plantar deep infection with MRSA status post  I&D 10/2008  . Myocardial infarction [facility])    "was told I'd had  several before heart OR in 2002" (01/08/2018)  . Noncompliance   . Peripheral neuropathy   . Syncope   . Type II diabetes mellitus ([place])    requiring insulin     Current Outpatient Medications  Medication Sig Dispense Refill  . allopurinol (ZYLOPRIM) 300 MG tablet Take 2 tablets (600 mg total) by mouth daily. (Patient taking differently: Take 600 mg by mouth daily as needed (gout). ) 180 tablet 3  . aspirin 81 MG EC tablet TAKE 1 TABLET BY MOUTH EVERY DAY (Patient taking differently: Take 81 mg by mouth daily. ) 30 tablet 11  . BAYER MICROLET LANCETS lancets Use as instructed 100 each 11  . carvedilol (COREG) 6.25 MG tablet Take 1 tablet (6.25 mg total) by mouth 2 (two) times daily. 60 tablet 11  . ceFAZolin (ANCEF) IVPB Inject 1 g into the vein every 8 (eight) hours. Indication:  Osteomyelitis Last Day of Therapy:  09/15/2018 Labs - Once weekly:  CBC/D and BMP, Labs - Every other week:  ESR and CRP 126 Units 0  . digoxin (LANOXIN) 0.125 MG tablet Take 1 tablet (0.125 mg total) by mouth daily. 30 tablet 3  . insulin aspart (NOVOLOG FLEXPEN) 100 UNIT/ML FlexPen Inject 5 units into the skin 3 times daily with meals.  Dx code: E11.9 15 mL 11  . Insulin Pen Needle (BD PEN NEEDLE NANO U/F) 32G X 4 MM MISC USE 4 TIMES DAILY 100 each 11  . potassium chloride SA (K-DUR,KLOR-CON) 20 MEQ tablet Take 1 tablet (20 mEq total) by mouth daily. 90 tablet 3  . rosuvastatin  (CRESTOR) 40 MG tablet Take 1 tablet (40 mg total) by mouth daily. 30 tablet 11  . sacubitril-valsartan (ENTRESTO) 49-51 MG Take 1 tablet by mouth 2 (two) times daily. 60 tablet 6  . spironolactone (ALDACTONE) 25 MG tablet Take 1 tablet (25 mg total) by mouth daily. 30 tablet 11  . torsemide (DEMADEX) 20 MG tablet TAKE 5 TABLETS BY MOUTH TWICE A DAY (Patient taking differently: Take 100 mg by mouth 2 (two) times daily. ) 240 tablet 6   No current facility-administered medications for this encounter.     Vitals:   08/13/18 1441  BP: 118/78  Pulse: (!) 116  SpO2: 98%  Weight: 90.7 kg (200 lb)   Wt Readings from Last 3 Encounters:  08/13/18 90.7 kg (200 lb)  08/11/18 89.6 kg (197 lb 8 oz)  08/07/18 90.6 kg (199 lb 11.2 oz)    PHYSICAL EXAM: General: Well appearing. No resp difficulty. HEENT: Normal Neck: Supple. JVP ~7. Carotids 2+ bilat; no bruits. No thyromegaly or nodule noted. Cor: PMI laterally displaced. Tachy, irregular. No M/G/R noted Lungs: CTAB, normal effort. Abdomen: Soft, non-tender, non-distended, no HSM. No bruits or masses. +BS  Extremities: No cyanosis, clubbing, or rash. RLE 1+ ankle edema. In boot. No edema LLE. RUE PICC Neuro: Alert & orientedx3, cranial nerves grossly intact. moves all 4 extremities w/o difficulty. Affect pleasant  EKG: Afib 103 bpm. Personally reviewed.   ASSESSMENT & PLAN: 1.Chronic systolic HF:ICM.HasMedtronic ICD. Narrow QRS, not CRT candidate. Has required milrinone multiple times in the past for low output HF. Offered advanced therapies in past but declined. Echo 6/19 with EF 20% and moderately decreased RV systolic function.CPX 4/19 with severe functional limitation due to [facility] 6/19 showed low output and coronary angiography showed no interventional targets.Echo 08/04/18: EF 20-25%, mild LVH, diffuse HK, akinesis of anteroseptal and anterior myocardium, grade 2 DD, trivial AI, LA mod dilated, RV moderately  dilated, RA mildly  dilated, moderate TR, PA peak pressure 49 mm Hg. - NYHA II, confounded by inactivity due to osteomyelitis of left foot - Volume stable on exam and on optivol.  - Continue torsemide 100 mg BID + K 20 meq daily. BMET 08/11/18: creatinine 1.59, K 4.2. Getting weekly labs through [facility].  - Continue Entresto49/51 mg BID - Continuespironolactone25 mg daily.  -Intolerant ivabradine due to nausea. -Increase coreg to 12.5 mg BID. He had been taking 9.375 mg BID. Now has new Afib RVR. See below.  - Continue digoxin0.125 mg daily. Dig level <0.2 07/2018 - Has been very resistant to LVAD/transplant though evaluation for both offered in past. Now with left foot osteomyelitis, will need treatment/resolution before considering LVAD even if he would agree to evaluation.  2. CAD s/p CABG2002:Last cath in 6/19 with patent grafts.No s/s ischemia.  - Continue ASA, statin 3. CKD Stage 3:Baseline creatinine 1.3-1.5.  - With recent AKI. Getting weekly labs through [facility]. - Creatinine better on labs from 08/11/18 at 1.59 (from 1.86 on day of DC) 4.Left foot wound: MSSA osteomyelitis. CT left foot 07/07/18: suspicion for early osteomyelitis involving the base of the 5th metatarsal.  ABIs 12/30: RLE normal, LLE mild lower extremity arterial disease - He is on IV Ancef through PICC q8 hours x 42 days (through 09/15/18). Followed by [facility].  - Followed by wound clinic. Has f/u with ID on 1/23. 5. DM: Lantus stopped during recent admission due to several low blood sugars. A1C 6.0 this admission.  - Has seen PCP. No changes made.  6. Left eye blurry vision - Discussed with Dr Aundra Dubin. He needs to see his eye doctor today or tomorrow. Will try to contact [facility] get him an appointment - Check carotid dopplers 7. New Afib - He does not have an atrial lead, so MDT rep unable to tell when he went into Afib. HR has been trending up since early January. - Start Eliquis 5 mg BID. Provided copay card.  -  Increase coreg to 12.5 mg BID (previously taking 9.375 mg BID).  - Reassess in 1 week. If he remains in AF, will need to set him up for DCCV after he has been on Eliquis for 1 month.   Follow up in 1 week. Discussed all of the above with Dr Aundra Dubin.   Georgiana Shore, NP 08/13/2018  Greater than 50% of the 25 minute visit was spent in counseling/coordination of care regarding disease state education, salt/fluid restriction, sliding scale diuretics, and medication compliance.

## 2018-08-13 ENCOUNTER — Ambulatory Visit (HOSPITAL_BASED_OUTPATIENT_CLINIC_OR_DEPARTMENT_OTHER)
Admission: RE | Admit: 2018-08-13 | Discharge: 2018-08-13 | Disposition: A | Discharge status: Home / Self Care | Payer: BLUE CROSS/BLUE SHIELD | Source: Ambulatory Visit | Attending: Cardiology | Admitting: Cardiology

## 2018-08-13 ENCOUNTER — Ambulatory Visit (HOSPITAL_COMMUNITY)
Admission: RE | Admit: 2018-08-13 | Discharge: 2018-08-13 | Disposition: A | Discharge status: Home / Self Care | Payer: BLUE CROSS/BLUE SHIELD | Source: Ambulatory Visit | Attending: Internal Medicine | Admitting: Internal Medicine

## 2018-08-13 ENCOUNTER — Encounter (HOSPITAL_COMMUNITY): Payer: Self-pay

## 2018-08-13 VITALS — BP 118/78 | HR 116 | Wt 200.0 lb

## 2018-08-13 DIAGNOSIS — Z7982 Long term (current) use of aspirin: Secondary | ICD-10-CM | POA: Diagnosis not present

## 2018-08-13 DIAGNOSIS — M109 Gout, unspecified: Secondary | ICD-10-CM | POA: Insufficient documentation

## 2018-08-13 DIAGNOSIS — B9561 Methicillin susceptible Staphylococcus aureus infection as the cause of diseases classified elsewhere: Secondary | ICD-10-CM | POA: Insufficient documentation

## 2018-08-13 DIAGNOSIS — Z833 Family history of diabetes mellitus: Secondary | ICD-10-CM | POA: Insufficient documentation

## 2018-08-13 DIAGNOSIS — I130 Hypertensive heart and chronic kidney disease with heart failure and stage 1 through stage 4 chronic kidney disease, or unspecified chronic kidney disease: Secondary | ICD-10-CM | POA: Diagnosis not present

## 2018-08-13 DIAGNOSIS — Z95810 Presence of automatic (implantable) cardiac defibrillator: Secondary | ICD-10-CM | POA: Insufficient documentation

## 2018-08-13 DIAGNOSIS — Z79899 Other long term (current) drug therapy: Secondary | ICD-10-CM | POA: Insufficient documentation

## 2018-08-13 DIAGNOSIS — E1122 Type 2 diabetes mellitus with diabetic chronic kidney disease: Secondary | ICD-10-CM | POA: Diagnosis not present

## 2018-08-13 DIAGNOSIS — M869 Osteomyelitis, unspecified: Secondary | ICD-10-CM

## 2018-08-13 DIAGNOSIS — Z794 Long term (current) use of insulin: Secondary | ICD-10-CM | POA: Insufficient documentation

## 2018-08-13 DIAGNOSIS — I252 Old myocardial infarction: Secondary | ICD-10-CM | POA: Diagnosis not present

## 2018-08-13 DIAGNOSIS — E1140 Type 2 diabetes mellitus with diabetic neuropathy, unspecified: Secondary | ICD-10-CM | POA: Diagnosis not present

## 2018-08-13 DIAGNOSIS — N183 Chronic kidney disease, stage 3 unspecified: Secondary | ICD-10-CM

## 2018-08-13 DIAGNOSIS — Z8249 Family history of ischemic heart disease and other diseases of the circulatory system: Secondary | ICD-10-CM | POA: Insufficient documentation

## 2018-08-13 DIAGNOSIS — I2510 Atherosclerotic heart disease of native coronary artery without angina pectoris: Secondary | ICD-10-CM

## 2018-08-13 DIAGNOSIS — H538 Other visual disturbances: Secondary | ICD-10-CM

## 2018-08-13 DIAGNOSIS — Z951 Presence of aortocoronary bypass graft: Secondary | ICD-10-CM | POA: Diagnosis not present

## 2018-08-13 DIAGNOSIS — I5022 Chronic systolic (congestive) heart failure: Secondary | ICD-10-CM | POA: Insufficient documentation

## 2018-08-13 DIAGNOSIS — I255 Ischemic cardiomyopathy: Secondary | ICD-10-CM | POA: Insufficient documentation

## 2018-08-13 DIAGNOSIS — E1169 Type 2 diabetes mellitus with other specified complication: Secondary | ICD-10-CM | POA: Insufficient documentation

## 2018-08-13 DIAGNOSIS — I4891 Unspecified atrial fibrillation: Secondary | ICD-10-CM | POA: Insufficient documentation

## 2018-08-13 MED ORDER — CARVEDILOL 12.5 MG PO TABS: 12.5000 mg | ORAL_TABLET | Freq: Two times a day (BID) | ORAL | 11 refills | Status: DC

## 2018-08-13 MED ORDER — APIXABAN 5 MG PO TABS: 5.0000 mg | ORAL_TABLET | Freq: Two times a day (BID) | ORAL | 6 refills | Status: DC

## 2018-08-13 NOTE — Patient Instructions (Addendum)
INCREASE Coreg to 12.5mg  (1 tab) twice a day  START Eliquis 5mg  (1 tab) twice a day  STOP Aspirin  [facility] eye appointment: Tomorrow 08/14/2018 at 12:15pm with Dr. Kathlen Mody.  [address] [place]  Carotid dopplers to be done today. Office will contact you with results.   Your physician recommends that you schedule a follow-up appointment in: 1 week with NP/PA

## 2018-08-13 NOTE — Progress Notes (Signed)
Carotid duplex       has been completed. Preliminary results can be found under CV proc through chart review. Markala Sitts, BS, RDMS, RVT   

## 2018-08-14 ENCOUNTER — Telehealth (HOSPITAL_COMMUNITY): Payer: Self-pay

## 2018-08-14 DIAGNOSIS — E1169 Type 2 diabetes mellitus with other specified complication: Secondary | ICD-10-CM | POA: Diagnosis not present

## 2018-08-14 DIAGNOSIS — A4902 Methicillin resistant Staphylococcus aureus infection, unspecified site: Secondary | ICD-10-CM | POA: Diagnosis not present

## 2018-08-14 DIAGNOSIS — H2513 Age-related nuclear cataract, bilateral: Secondary | ICD-10-CM | POA: Diagnosis not present

## 2018-08-14 DIAGNOSIS — E113493 Type 2 diabetes mellitus with severe nonproliferative diabetic retinopathy without macular edema, bilateral: Secondary | ICD-10-CM | POA: Diagnosis not present

## 2018-08-14 DIAGNOSIS — H4312 Vitreous hemorrhage, left eye: Secondary | ICD-10-CM | POA: Diagnosis not present

## 2018-08-14 DIAGNOSIS — L03119 Cellulitis of unspecified part of limb: Secondary | ICD-10-CM | POA: Diagnosis not present

## 2018-08-14 DIAGNOSIS — I5020 Unspecified systolic (congestive) heart failure: Secondary | ICD-10-CM | POA: Diagnosis not present

## 2018-08-14 DIAGNOSIS — M86172 Other acute osteomyelitis, left ankle and foot: Secondary | ICD-10-CM | POA: Diagnosis not present

## 2018-08-14 DIAGNOSIS — H35031 Hypertensive retinopathy, right eye: Secondary | ICD-10-CM | POA: Diagnosis not present

## 2018-08-14 DIAGNOSIS — E113593 Type 2 diabetes mellitus with proliferative diabetic retinopathy without macular edema, bilateral: Secondary | ICD-10-CM | POA: Diagnosis not present

## 2018-08-14 DIAGNOSIS — H3562 Retinal hemorrhage, left eye: Secondary | ICD-10-CM | POA: Diagnosis not present

## 2018-08-14 NOTE — Telephone Encounter (Signed)
Pt called given results from [facility] [place] Nonobstructive disease

## 2018-08-15 ENCOUNTER — Encounter: Payer: Self-pay | Admitting: Family Medicine

## 2018-08-15 DIAGNOSIS — H5462 Unqualified visual loss, left eye, normal vision right eye: Secondary | ICD-10-CM | POA: Diagnosis not present

## 2018-08-15 DIAGNOSIS — L97524 Non-pressure chronic ulcer of other part of left foot with necrosis of bone: Secondary | ICD-10-CM | POA: Diagnosis not present

## 2018-08-15 DIAGNOSIS — B9689 Other specified bacterial agents as the cause of diseases classified elsewhere: Secondary | ICD-10-CM | POA: Diagnosis not present

## 2018-08-15 DIAGNOSIS — E1169 Type 2 diabetes mellitus with other specified complication: Secondary | ICD-10-CM | POA: Diagnosis not present

## 2018-08-15 DIAGNOSIS — R609 Edema, unspecified: Secondary | ICD-10-CM | POA: Diagnosis not present

## 2018-08-15 DIAGNOSIS — M86372 Chronic multifocal osteomyelitis, left ankle and foot: Secondary | ICD-10-CM | POA: Diagnosis not present

## 2018-08-15 DIAGNOSIS — L97522 Non-pressure chronic ulcer of other part of left foot with fat layer exposed: Secondary | ICD-10-CM | POA: Diagnosis not present

## 2018-08-15 DIAGNOSIS — R112 Nausea with vomiting, unspecified: Secondary | ICD-10-CM | POA: Diagnosis not present

## 2018-08-15 DIAGNOSIS — L89899 Pressure ulcer of other site, unspecified stage: Secondary | ICD-10-CM | POA: Diagnosis not present

## 2018-08-15 DIAGNOSIS — R229 Localized swelling, mass and lump, unspecified: Secondary | ICD-10-CM | POA: Diagnosis not present

## 2018-08-15 DIAGNOSIS — I5022 Chronic systolic (congestive) heart failure: Secondary | ICD-10-CM | POA: Diagnosis not present

## 2018-08-15 DIAGNOSIS — I2510 Atherosclerotic heart disease of native coronary artery without angina pectoris: Secondary | ICD-10-CM | POA: Diagnosis not present

## 2018-08-15 DIAGNOSIS — E1140 Type 2 diabetes mellitus with diabetic neuropathy, unspecified: Secondary | ICD-10-CM | POA: Diagnosis not present

## 2018-08-15 DIAGNOSIS — T373X5A Adverse effect of other antiprotozoal drugs, initial encounter: Secondary | ICD-10-CM | POA: Diagnosis not present

## 2018-08-15 DIAGNOSIS — I110 Hypertensive heart disease with heart failure: Secondary | ICD-10-CM | POA: Diagnosis not present

## 2018-08-15 DIAGNOSIS — B9561 Methicillin susceptible Staphylococcus aureus infection as the cause of diseases classified elsewhere: Secondary | ICD-10-CM | POA: Diagnosis not present

## 2018-08-15 DIAGNOSIS — I255 Ischemic cardiomyopathy: Secondary | ICD-10-CM | POA: Diagnosis not present

## 2018-08-15 DIAGNOSIS — B954 Other streptococcus as the cause of diseases classified elsewhere: Secondary | ICD-10-CM | POA: Diagnosis not present

## 2018-08-15 DIAGNOSIS — E11621 Type 2 diabetes mellitus with foot ulcer: Secondary | ICD-10-CM | POA: Diagnosis not present

## 2018-08-15 LAB — HM DIABETES EYE EXAM

## 2018-08-17 LAB — CUP PACEART REMOTE DEVICE CHECK
Brady Statistic RV Percent Paced: 0 %
Date Time Interrogation Session: 20191112222942
HighPow Impedance: 247 Ohm
HighPow Impedance: 30 Ohm
HighPow Impedance: 38 Ohm
Implantable Lead Implant Date: 20130603
Implantable Lead Location: 753860
Implantable Lead Model: 6947
Implantable Pulse Generator Implant Date: 20130603
Lead Channel Impedance Value: 323 Ohm
Lead Channel Pacing Threshold Amplitude: 0.875 V
Lead Channel Pacing Threshold Pulse Width: 0.4 ms
Lead Channel Sensing Intrinsic Amplitude: 8.75 mV
Lead Channel Sensing Intrinsic Amplitude: 8.75 mV
Lead Channel Setting Pacing Pulse Width: 0.4 ms
Lead Channel Setting Sensing Sensitivity: 0.3 mV
MDC IDC MSMT BATTERY VOLTAGE: 2.96 V
MDC IDC SET LEADCHNL RV PACING AMPLITUDE: 2.5 V

## 2018-08-18 DIAGNOSIS — M869 Osteomyelitis, unspecified: Secondary | ICD-10-CM | POA: Diagnosis not present

## 2018-08-18 DIAGNOSIS — Z792 Long term (current) use of antibiotics: Secondary | ICD-10-CM | POA: Diagnosis not present

## 2018-08-18 DIAGNOSIS — B9561 Methicillin susceptible Staphylococcus aureus infection as the cause of diseases classified elsewhere: Secondary | ICD-10-CM | POA: Diagnosis not present

## 2018-08-18 DIAGNOSIS — I5022 Chronic systolic (congestive) heart failure: Secondary | ICD-10-CM | POA: Diagnosis not present

## 2018-08-18 DIAGNOSIS — E1169 Type 2 diabetes mellitus with other specified complication: Secondary | ICD-10-CM | POA: Diagnosis not present

## 2018-08-19 ENCOUNTER — Inpatient Hospital Stay (HOSPITAL_COMMUNITY): Payer: BLUE CROSS/BLUE SHIELD

## 2018-08-21 ENCOUNTER — Ambulatory Visit (HOSPITAL_COMMUNITY)
Admission: RE | Admit: 2018-08-21 | Discharge: 2018-08-21 | Disposition: A | Discharge status: Home / Self Care | Payer: BLUE CROSS/BLUE SHIELD | Source: Ambulatory Visit | Attending: Internal Medicine | Admitting: Internal Medicine

## 2018-08-21 ENCOUNTER — Encounter (HOSPITAL_COMMUNITY): Payer: Self-pay

## 2018-08-21 ENCOUNTER — Other Ambulatory Visit (HOSPITAL_COMMUNITY): Payer: Self-pay | Admitting: Cardiology

## 2018-08-21 ENCOUNTER — Other Ambulatory Visit (HOSPITAL_COMMUNITY): Payer: Self-pay

## 2018-08-21 VITALS — BP 118/82 | HR 101 | Wt 203.6 lb

## 2018-08-21 DIAGNOSIS — Z951 Presence of aortocoronary bypass graft: Secondary | ICD-10-CM | POA: Diagnosis not present

## 2018-08-21 DIAGNOSIS — Z794 Long term (current) use of insulin: Secondary | ICD-10-CM | POA: Diagnosis not present

## 2018-08-21 DIAGNOSIS — N183 Chronic kidney disease, stage 3 unspecified: Secondary | ICD-10-CM

## 2018-08-21 DIAGNOSIS — I130 Hypertensive heart and chronic kidney disease with heart failure and stage 1 through stage 4 chronic kidney disease, or unspecified chronic kidney disease: Secondary | ICD-10-CM | POA: Diagnosis not present

## 2018-08-21 DIAGNOSIS — Z79899 Other long term (current) drug therapy: Secondary | ICD-10-CM | POA: Insufficient documentation

## 2018-08-21 DIAGNOSIS — E1140 Type 2 diabetes mellitus with diabetic neuropathy, unspecified: Secondary | ICD-10-CM | POA: Insufficient documentation

## 2018-08-21 DIAGNOSIS — I4892 Unspecified atrial flutter: Secondary | ICD-10-CM | POA: Insufficient documentation

## 2018-08-21 DIAGNOSIS — M869 Osteomyelitis, unspecified: Secondary | ICD-10-CM | POA: Insufficient documentation

## 2018-08-21 DIAGNOSIS — B9561 Methicillin susceptible Staphylococcus aureus infection as the cause of diseases classified elsewhere: Secondary | ICD-10-CM | POA: Diagnosis not present

## 2018-08-21 DIAGNOSIS — I2510 Atherosclerotic heart disease of native coronary artery without angina pectoris: Secondary | ICD-10-CM | POA: Insufficient documentation

## 2018-08-21 DIAGNOSIS — Z8249 Family history of ischemic heart disease and other diseases of the circulatory system: Secondary | ICD-10-CM | POA: Diagnosis not present

## 2018-08-21 DIAGNOSIS — E1122 Type 2 diabetes mellitus with diabetic chronic kidney disease: Secondary | ICD-10-CM | POA: Diagnosis not present

## 2018-08-21 DIAGNOSIS — R9431 Abnormal electrocardiogram [ECG] [EKG]: Secondary | ICD-10-CM | POA: Insufficient documentation

## 2018-08-21 DIAGNOSIS — H538 Other visual disturbances: Secondary | ICD-10-CM | POA: Insufficient documentation

## 2018-08-21 DIAGNOSIS — Z95810 Presence of automatic (implantable) cardiac defibrillator: Secondary | ICD-10-CM | POA: Insufficient documentation

## 2018-08-21 DIAGNOSIS — I5022 Chronic systolic (congestive) heart failure: Secondary | ICD-10-CM | POA: Diagnosis not present

## 2018-08-21 DIAGNOSIS — Z833 Family history of diabetes mellitus: Secondary | ICD-10-CM | POA: Insufficient documentation

## 2018-08-21 DIAGNOSIS — I255 Ischemic cardiomyopathy: Secondary | ICD-10-CM | POA: Diagnosis not present

## 2018-08-21 DIAGNOSIS — I4891 Unspecified atrial fibrillation: Secondary | ICD-10-CM | POA: Insufficient documentation

## 2018-08-21 DIAGNOSIS — Z7901 Long term (current) use of anticoagulants: Secondary | ICD-10-CM | POA: Insufficient documentation

## 2018-08-21 DIAGNOSIS — M109 Gout, unspecified: Secondary | ICD-10-CM | POA: Insufficient documentation

## 2018-08-21 DIAGNOSIS — I252 Old myocardial infarction: Secondary | ICD-10-CM | POA: Diagnosis not present

## 2018-08-21 NOTE — Patient Instructions (Addendum)
You are scheduled for Cardioversion on Wednesday September 03, 2018 at 7a. See instruction sheet  Your physician recommends that you schedule a follow-up appointment in: 3 weeks with NP/PA

## 2018-08-21 NOTE — Progress Notes (Signed)
Patient ID: Marcus Johnson, male   DOB: 1960/12/03, 58 y.o.   MRN: [id number] PCP: Dr. Sarajane Jews Cardiology: Dr. Aundra Dubin  HPI: Marcus Johnson is a 58 y.o. male with h/o DM2, CAD s/p CABG 7371, systolic HF due to ischemic cardiomyopathy with EF 20-25% (echo 12/15), DM2 and CKD. He is s/p Medtronic single chamber ICD.  Admitted in 12/15 due to ADHF. Required short course milrinone for diuresis. Diuresed 30 pounds.   CPX 2/16 showed severely reduced functional capacity.  There was a significant disconnect between his symptoms and the CPX.  RHC in 6/16 showed fairly normal filling pressures and low but not markedly low cardiac output.   CPX (4/19) was submaximal but suggested severe limitation from HF.   He was admitted in 6/19 with marked volume overload and dyspnea.  Echo in 6/19 showed EF 20% with severe global hypokinesis.  LHC/RHC showed severe native vessel CAD with patent LIMA-D, SVG-OM, and SVG-PLV; cardiac index low.  He was started on milrinone and diuresed.  We discussed LVAD extensively in light of low output, creatinine that is trending up and concern for decompensation of RV over time. He was adamant that he did not want to pursue LVAD workup yet and did not want a referral for transplant evaluation. Milrinone was weaned off and he was discharged home.   Admitted 08/01/18-08/07/18 with volume overload and left foot wound. Diuresed 38 lbs with IV lasix and then transitioned back to torsemide 100 mg BID. ID was consulted with concerns for left foot osteomyelitis noted on recent CT scan. He will be on IV ancef q8 hours x 42 days. [facility] provided teaching and supplies for home antibiotics. Course complicated by AKI. DC weight 200 lbs.   Today he returns for HF follow up. Last visit coreg was increased due to a fib. Overall feeling ok but has ongoing left foot pain. Also has ongoing problem with his left eye. Says he has been referred to a specialist.  Denies SOB/PND/Orthopnea. No bleeding problems. Appetite  ok. No fever or chills. No problems with RUE PICC. Weight at home 200 pounds. Taking all medications. [facility] following for home antibiotics.   [facility] labs 08/11/18: creatinine 1.59, K 4.2  Studies: Echo (12/15): EF 20-25% RV moderately HK CPX (2/16): RER 1.10, peak VO2 8.6, VE/VCO2 slope 43.7 => severely reduced functional capacity.  RHC (6/16): mean RA 8, PA 48/19 mean 30, mean PCWP 14, CI 2.03 Fick/2.28 Thermo, PVR 3.6.  Echo (5/17): EF 25%, mild LV dilation, moderate diastolic dysfunction, normal RV size with mild to moderate systolic dysfunction, mild mitral regurgitation CPX (4/19): Peak VO2 7, VE/VCO2 slope 41, RER 1.02 => submaximal but still suggestive of severe HF limitation.  Echo (6/19): EF 20%, diffuse severe HK, moderately decreased RV systolic function, moderate TR.  LHC/RHC (6/19): Severe native vessel CAD with patent LIMA-D, SVG-OM, and SVG-PLV; mean RA 15, PA 54/24 mean 37, mean PCWP 32, CI 1.86 Fick/1.64 thermo. PAPi 2, PVR 1.25 WU.   Labs:  8/15 LDL 88 12/15 K+ 4.2 creatinine 1.09  1/16 K 4.3, creatinine 1.34 4/16 K 4.5, creatinine 1.19 3/17 K 3.8, creatinine 1.22, HCT 35.3, BNP 1597 5/17 K 4.1, creatinine 1.25 6/18 LDL 138, K 4.4, creatinine 1.5 6/19 hgb 13.5, K 4.9, creatinine 1.53 => 1.5 => 1.4, BNP 992 08/07/2018: K 4.3 Creatinine 1.86   Review of systems complete and found to be negative unless listed in HPI.   SH:  Social History   Socioeconomic History  . Marital status:  Divorced    Spouse name: Not on file  . Number of children: Not on file  . Years of education: Not on file  . Highest education level: Not on file  Occupational History  . Not on file  Social Needs  . Financial resource strain: Not on file  . Food insecurity:    Worry: Not on file    Inability: Not on file  . Transportation needs:    Medical: Not on file    Non-medical: Not on file  Tobacco Use  . Smoking status: Never Smoker  . Smokeless tobacco: Never Used  Substance and Sexual  Activity  . Alcohol use: No    Alcohol/week: 0.0 standard drinks  . Drug use: No  . Sexual activity: Not on file  Lifestyle  . Physical activity:    Days per week: Not on file    Minutes per session: Not on file  . Stress: Not on file  Relationships  . Social connections:    Talks on phone: Not on file    Gets together: Not on file    Attends religious service: Not on file    Active member of club or organization: Not on file    Attends meetings of clubs or organizations: Not on file    Relationship status: Not on file  . Intimate partner violence:    Fear of current or ex partner: Not on file    Emotionally abused: Not on file    Physically abused: Not on file    Forced sexual activity: Not on file  Other Topics Concern  . Not on file  Social History Narrative   Lives in [place] with his son and his family.  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    FH:  Family History  Problem Relation Age of Onset  . Diabetes Father        died in his 15's  . Hypertension Mother        died in her 14's - had a ppm.    Past Medical History:  Diagnosis Date  . AICD (automatic cardioverter/defibrillator) present    Single-chamber  implantable cardiac defibrillator - Medtronic  . CHF (congestive heart failure) ([place])   . Chronic systolic heart failure ([place])    a. Echo 5/13: Mild LVE, mild LVH, EF 10%, anteroseptal, lateral, apical AK, mild MR, mild LAE, moderately reduced RVSF, mild RAE, PASP 60;  b. 07/2014 Echo: EF 20-2%, diff HK, AKI of antsep/apical/mid-apicalinferior, mod reduced RV.  Marland Kitchen Coronary artery disease    a. s/p CABG 2002 b. LHC 5/13:  dLM 80%, LAD subtotally occluded, pCFX occluded, pRCA 50%, mid? Occlusion with high take off of the PDA with 70% multiple lesions-not bypassed and supplies collaterals to LAD, LIMA-IM/ramus ok, S-OM ok, S-PLA branches ok. Medical therapy was recommended  . Gout    "on daily RX" (01/08/2018)  . Hypertension   .  Ischemic cardiomyopathy    a. Echo 5/13: Mild LVE, mild LVH, EF 10%, anteroseptal, lateral, apical AK, mild MR, mild LAE, moderately reduced RVSF, mild RAE, PASP 60;  b. 01/2012 s/p MDT [id number] Protecta XT VR AICD;  c. 07/2014 Echo: EF 20-2%, diff HK, AKI of antsep/apical/mid-apicalinferior, mod reduced RV.  Marland Kitchen MRSA (methicillin resistant Staphylococcus aureus)    Status post right foot plantar deep infection with MRSA status post  I&D 10/2008  . Myocardial infarction [facility])    "was told I'd had several before heart OR in  2002" (01/08/2018)  . Noncompliance   . Peripheral neuropathy   . Syncope   . Type II diabetes mellitus ([employer])    requiring insulin     Current Outpatient Medications  Medication Sig Dispense Refill  . allopurinol (ZYLOPRIM) 300 MG tablet Take 2 tablets (600 mg total) by mouth daily. (Patient taking differently: Take 600 mg by mouth daily as needed (gout). ) 180 tablet 3  . apixaban (ELIQUIS) 5 MG TABS tablet Take 1 tablet (5 mg total) by mouth 2 (two) times daily. 60 tablet 6  . BAYER MICROLET LANCETS lancets Use as instructed 100 each 11  . carvedilol (COREG) 12.5 MG tablet Take 1 tablet (12.5 mg total) by mouth 2 (two) times daily. 30 tablet 11  . ceFAZolin (ANCEF) IVPB Inject 1 g into the vein every 8 (eight) hours. Indication:  Osteomyelitis Last Day of Therapy:  09/15/2018 Labs - Once weekly:  CBC/D and BMP, Labs - Every other week:  ESR and CRP 126 Units 0  . digoxin (LANOXIN) 0.125 MG tablet Take 1 tablet (0.125 mg total) by mouth daily. 30 tablet 3  . insulin aspart (NOVOLOG FLEXPEN) 100 UNIT/ML FlexPen Inject 5 units into the skin 3 times daily with meals.  Dx code: E11.9 15 mL 11  . Insulin Pen Needle (BD PEN NEEDLE NANO U/F) 32G X 4 MM MISC USE 4 TIMES DAILY 100 each 11  . potassium chloride SA (K-DUR,KLOR-CON) 20 MEQ tablet Take 1 tablet (20 mEq total) by mouth daily. 90 tablet 3  . rosuvastatin (CRESTOR) 40 MG tablet Take 1 tablet (40 mg total) by mouth daily. 30  tablet 11  . sacubitril-valsartan (ENTRESTO) 49-51 MG Take 1 tablet by mouth 2 (two) times daily. 60 tablet 6  . spironolactone (ALDACTONE) 25 MG tablet Take 1 tablet (25 mg total) by mouth daily. 30 tablet 11  . torsemide (DEMADEX) 20 MG tablet TAKE 5 TABLETS BY MOUTH TWICE A DAY (Patient taking differently: Take 100 mg by mouth 2 (two) times daily. ) 240 tablet 6   No current facility-administered medications for this encounter.     Vitals:   08/21/18 1430  BP: 118/82  Pulse: (!) 101  SpO2: 100%  Weight: 92.4 kg (203 lb 9.6 oz)   Wt Readings from Last 3 Encounters:  08/21/18 92.4 kg (203 lb 9.6 oz)  08/13/18 90.7 kg (200 lb)  08/11/18 89.6 kg (197 lb 8 oz)    PHYSICAL EXAM: General:  Well appearing. No resp difficulty HEENT: normal Neck: supple. no JVD. Carotids 2+ bilat; no bruits. No lymphadenopathy or thryomegaly appreciated. Cor: PMI nondisplaced. Irregular  rate & rhythm. No rubs, gallops or murmurs. Lungs: clear Abdomen: soft, nontender, nondistended. No hepatosplenomegaly. No bruits or masses. Good bowel sounds. Extremities: no cyanosis, clubbing, rash, edema. L foot dressing  Neuro: alert & orientedx3, cranial nerves grossly intact. moves all 4 extremities w/o difficulty. Affect pleasant  EKG: A flutter 100 bpm   ASSESSMENT & PLAN: 1.Chronic systolic HF:ICM.HasMedtronic ICD. Narrow QRS, not CRT candidate. Has required milrinone multiple times in the past for low output HF. Offered advanced therapies in past but declined. Echo 6/19 with EF 20% and moderately decreased RV systolic function.CPX 4/19 with severe functional limitation due to [facility] 6/19 showed low output and coronary angiography showed no interventional targets.Echo 08/04/18: EF 20-25%, mild LVH, diffuse HK, akinesis of anteroseptal and anterior myocardium, grade 2 DD, trivial AI, LA mod dilated, RV moderately dilated, RA mildly dilated, moderate TR, PA peak pressure 49 mm  Hg. NYHA II. Volume  status stable.  No change.  - Continue torsemide 100 mg BID + K 20 meq daily. - Continue Entresto49/51 mg BID - Continuespironolactone25 mg daily.  -Intolerant ivabradine due to nausea. -Continue coreg to 12.5 mg BID.  - Continue digoxin0.125 mg daily. Dig level <0.2 07/2018 - Has been very resistant to LVAD/transplant evaluation for both offered in past. Now with left foot osteomyelitis, will need treatment/resolution before considering LVAD even if he would agree to evaluation.  2. CAD s/p CABG2002:Last cath in 6/19 with patent grafts. -No s/s ischemia.   - Continue ASA, statin 3. CKD Stage 3:Baseline creatinine 1.3-1.5.  - Continue weekly BMET  4.Left foot wound: MSSA osteomyelitis. CT left foot 07/07/18: suspicion for early osteomyelitis involving the base of the 5th metatarsal.  ABIs 12/30: RLE normal, LLE mild lower extremity arterial disease - He is on IV Ancef through PICC q8 hours x 42 days (through 09/15/18). Follow[facility].  - Followed by wound clinic. Has f/u with ID on 1/23. 5. DM: Lantus stopped during recent admission due to several low blood sugars. A1C 6.0 this admission.  - Has seen PCP. No changes made.  6. Left eye blurry vision -  Saw eye doctor and has been referred to specialist.  - Carotid dopplers ok 7. A flutter.  Remains in A flutter/fib.  Continue carvedilol 12.5 mg twice a day + eliquis 5 mg twice a day. Set up for cardioversion.   Follow up in 3 weeks after cardioversion. Discussed cardioversion and EKG.   Darrick Grinder, NP 08/21/2018

## 2018-08-21 NOTE — H&P (View-Only) (Signed)
Patient ID: Marcus Johnson, male   DOB: 27-Jun-1961, 58 y.o.   MRN: [id number] PCP: Dr. Sarajane Jews Cardiology: Dr. Aundra Dubin  HPI: [name] is a 58 y.o. male with h/o DM2, CAD s/p CABG 6333, systolic HF due to ischemic cardiomyopathy with EF 20-25% (echo 12/15), DM2 and CKD. He is s/p Medtronic single chamber ICD.  Admitted in 12/15 due to ADHF. Required short course milrinone for diuresis. Diuresed 30 pounds.   CPX 2/16 showed severely reduced functional capacity.  There was a significant disconnect between his symptoms and the CPX.  RHC in 6/16 showed fairly normal filling pressures and low but not markedly low cardiac output.   CPX (4/19) was submaximal but suggested severe limitation from HF.   He was admitted in 6/19 with marked volume overload and dyspnea.  Echo in 6/19 showed EF 20% with severe global hypokinesis.  LHC/RHC showed severe native vessel CAD with patent LIMA-D, SVG-OM, and SVG-PLV; cardiac index low.  He was started on milrinone and diuresed.  We discussed LVAD extensively in light of low output, creatinine that is trending up and concern for decompensation of RV over time. He was adamant that he did not want to pursue LVAD workup yet and did not want a referral for transplant evaluation. Milrinone was weaned off and he was discharged home.   Admitted 08/01/18-08/07/18 with volume overload and left foot wound. Diuresed 38 lbs with IV lasix and then transitioned back to torsemide 100 mg BID. ID was consulted with concerns for left foot osteomyelitis noted on recent CT scan. He will be on IV ancef q8 hours x 42 days. [facility] provided teaching and supplies for home antibiotics. Course complicated by AKI. DC weight 200 lbs.   Today he returns for HF follow up. Last visit coreg was increased due to a fib. Overall feeling ok but has ongoing left foot pain. Also has ongoing problem with his left eye. Says he has been referred to a specialist.  Denies SOB/PND/Orthopnea. No bleeding problems. Appetite  ok. No fever or chills. No problems with RUE PICC. Weight at home 200 pounds. Taking all medications. [facility] following for home antibiotics.   [facility] labs 08/11/18: creatinine 1.59, K 4.2  Studies: Echo (12/15): EF 20-25% RV moderately HK CPX (2/16): RER 1.10, peak VO2 8.6, VE/VCO2 slope 43.7 => severely reduced functional capacity.  RHC (6/16): mean RA 8, PA 48/19 mean 30, mean PCWP 14, CI 2.03 Fick/2.28 Thermo, PVR 3.6.  Echo (5/17): EF 25%, mild LV dilation, moderate diastolic dysfunction, normal RV size with mild to moderate systolic dysfunction, mild mitral regurgitation CPX (4/19): Peak VO2 7, VE/VCO2 slope 41, RER 1.02 => submaximal but still suggestive of severe HF limitation.  Echo (6/19): EF 20%, diffuse severe HK, moderately decreased RV systolic function, moderate TR.  LHC/RHC (6/19): Severe native vessel CAD with patent LIMA-D, SVG-OM, and SVG-PLV; mean RA 15, PA 54/24 mean 37, mean PCWP 32, CI 1.86 Fick/1.64 thermo. PAPi 2, PVR 1.25 WU.   Labs:  8/15 LDL 88 12/15 K+ 4.2 creatinine 1.09  1/16 K 4.3, creatinine 1.34 4/16 K 4.5, creatinine 1.19 3/17 K 3.8, creatinine 1.22, HCT 35.3, BNP 1597 5/17 K 4.1, creatinine 1.25 6/18 LDL 138, K 4.4, creatinine 1.5 6/19 hgb 13.5, K 4.9, creatinine 1.53 => 1.5 => 1.4, BNP 992 08/07/2018: K 4.3 Creatinine 1.86   Review of systems complete and found to be negative unless listed in HPI.   SH:  Social History   Socioeconomic History  . Marital status:  Divorced    Spouse name: Not on file  . Number of children: Not on file  . Years of education: Not on file  . Highest education level: Not on file  Occupational History  . Not on file  Social Needs  . Financial resource strain: Not on file  . Food insecurity:    Worry: Not on file    Inability: Not on file  . Transportation needs:    Medical: Not on file    Non-medical: Not on file  Tobacco Use  . Smoking status: Never Smoker  . Smokeless tobacco: Never Used  Substance and Sexual  Activity  . Alcohol use: No    Alcohol/week: 0.0 standard drinks  . Drug use: No  . Sexual activity: Not on file  Lifestyle  . Physical activity:    Days per week: Not on file    Minutes per session: Not on file  . Stress: Not on file  Relationships  . Social connections:    Talks on phone: Not on file    Gets together: Not on file    Attends religious service: Not on file    Active member of club or organization: Not on file    Attends meetings of clubs or organizations: Not on file    Relationship status: Not on file  . Intimate partner violence:    Fear of current or ex partner: Not on file    Emotionally abused: Not on file    Physically abused: Not on file    Forced sexual activity: Not on file  Other Topics Concern  . Not on file  Social History Narrative   Lives in [place] with his son and his family.  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    FH:  Family History  Problem Relation Age of Onset  . Diabetes Father        died in his 53's  . Hypertension Mother        died in her 72's - had a ppm.    Past Medical History:  Diagnosis Date  . AICD (automatic cardioverter/defibrillator) present    Single-chamber  implantable cardiac defibrillator - Medtronic  . CHF (congestive heart failure) ([place])   . Chronic systolic heart failure ([place])    a. Echo 5/13: Mild LVE, mild LVH, EF 10%, anteroseptal, lateral, apical AK, mild MR, mild LAE, moderately reduced RVSF, mild RAE, PASP 60;  b. 07/2014 Echo: EF 20-2%, diff HK, AKI of antsep/apical/mid-apicalinferior, mod reduced RV.  Marland Kitchen Coronary artery disease    a. s/p CABG 2002 b. LHC 5/13:  dLM 80%, LAD subtotally occluded, pCFX occluded, pRCA 50%, mid? Occlusion with high take off of the PDA with 70% multiple lesions-not bypassed and supplies collaterals to LAD, LIMA-IM/ramus ok, S-OM ok, S-PLA branches ok. Medical therapy was recommended  . Gout    "on daily RX" (01/08/2018)  . Hypertension   .  Ischemic cardiomyopathy    a. Echo 5/13: Mild LVE, mild LVH, EF 10%, anteroseptal, lateral, apical AK, mild MR, mild LAE, moderately reduced RVSF, mild RAE, PASP 60;  b. 01/2012 s/p MDT [id number] Protecta XT VR AICD;  c. 07/2014 Echo: EF 20-2%, diff HK, AKI of antsep/apical/mid-apicalinferior, mod reduced RV.  Marland Kitchen MRSA (methicillin resistant Staphylococcus aureus)    Status post right foot plantar deep infection with MRSA status post  I&D 10/2008  . Myocardial infarction [facility])    "was told I'd had several before heart OR in  2002" (01/08/2018)  . Noncompliance   . Peripheral neuropathy   . Syncope   . Type II diabetes mellitus ([place])    requiring insulin     Current Outpatient Medications  Medication Sig Dispense Refill  . allopurinol (ZYLOPRIM) 300 MG tablet Take 2 tablets (600 mg total) by mouth daily. (Patient taking differently: Take 600 mg by mouth daily as needed (gout). ) 180 tablet 3  . apixaban (ELIQUIS) 5 MG TABS tablet Take 1 tablet (5 mg total) by mouth 2 (two) times daily. 60 tablet 6  . BAYER MICROLET LANCETS lancets Use as instructed 100 each 11  . carvedilol (COREG) 12.5 MG tablet Take 1 tablet (12.5 mg total) by mouth 2 (two) times daily. 30 tablet 11  . ceFAZolin (ANCEF) IVPB Inject 1 g into the vein every 8 (eight) hours. Indication:  Osteomyelitis Last Day of Therapy:  09/15/2018 Labs - Once weekly:  CBC/D and BMP, Labs - Every other week:  ESR and CRP 126 Units 0  . digoxin (LANOXIN) 0.125 MG tablet Take 1 tablet (0.125 mg total) by mouth daily. 30 tablet 3  . insulin aspart (NOVOLOG FLEXPEN) 100 UNIT/ML FlexPen Inject 5 units into the skin 3 times daily with meals.  Dx code: E11.9 15 mL 11  . Insulin Pen Needle (BD PEN NEEDLE NANO U/F) 32G X 4 MM MISC USE 4 TIMES DAILY 100 each 11  . potassium chloride SA (K-DUR,KLOR-CON) 20 MEQ tablet Take 1 tablet (20 mEq total) by mouth daily. 90 tablet 3  . rosuvastatin (CRESTOR) 40 MG tablet Take 1 tablet (40 mg total) by mouth daily. 30  tablet 11  . sacubitril-valsartan (ENTRESTO) 49-51 MG Take 1 tablet by mouth 2 (two) times daily. 60 tablet 6  . spironolactone (ALDACTONE) 25 MG tablet Take 1 tablet (25 mg total) by mouth daily. 30 tablet 11  . torsemide (DEMADEX) 20 MG tablet TAKE 5 TABLETS BY MOUTH TWICE A DAY (Patient taking differently: Take 100 mg by mouth 2 (two) times daily. ) 240 tablet 6   No current facility-administered medications for this encounter.     Vitals:   08/21/18 1430  BP: 118/82  Pulse: (!) 101  SpO2: 100%  Weight: 92.4 kg (203 lb 9.6 oz)   Wt Readings from Last 3 Encounters:  08/21/18 92.4 kg (203 lb 9.6 oz)  08/13/18 90.7 kg (200 lb)  08/11/18 89.6 kg (197 lb 8 oz)    PHYSICAL EXAM: General:  Well appearing. No resp difficulty HEENT: normal Neck: supple. no JVD. Carotids 2+ bilat; no bruits. No lymphadenopathy or thryomegaly appreciated. Cor: PMI nondisplaced. Irregular  rate & rhythm. No rubs, gallops or murmurs. Lungs: clear Abdomen: soft, nontender, nondistended. No hepatosplenomegaly. No bruits or masses. Good bowel sounds. Extremities: no cyanosis, clubbing, rash, edema. L foot dressing  Neuro: alert & orientedx3, cranial nerves grossly intact. moves all 4 extremities w/o difficulty. Affect pleasant  EKG: A flutter 100 bpm   ASSESSMENT & PLAN: 1.Chronic systolic HF:ICM.HasMedtronic ICD. Narrow QRS, not CRT candidate. Has required milrinone multiple times in the past for low output HF. Offered advanced therapies in past but declined. Echo 6/19 with EF 20% and moderately decreased RV systolic function.CPX 4/19 with severe functional limitation due to [facility] 6/19 showed low output and coronary angiography showed no interventional targets.Echo 08/04/18: EF 20-25%, mild LVH, diffuse HK, akinesis of anteroseptal and anterior myocardium, grade 2 DD, trivial AI, LA mod dilated, RV moderately dilated, RA mildly dilated, moderate TR, PA peak pressure 49 mm  Hg. NYHA II. Volume  status stable.  No change.  - Continue torsemide 100 mg BID + K 20 meq daily. - Continue Entresto49/51 mg BID - Continuespironolactone25 mg daily.  -Intolerant ivabradine due to nausea. -Continue coreg to 12.5 mg BID.  - Continue digoxin0.125 mg daily. Dig level <0.2 07/2018 - Has been very resistant to LVAD/transplant evaluation for both offered in past. Now with left foot osteomyelitis, will need treatment/resolution before considering LVAD even if he would agree to evaluation.  2. CAD s/p CABG2002:Last cath in 6/19 with patent grafts. -No s/s ischemia.   - Continue ASA, statin 3. CKD Stage 3:Baseline creatinine 1.3-1.5.  - Continue weekly BMET  4.Left foot wound: MSSA osteomyelitis. CT left foot 07/07/18: suspicion for early osteomyelitis involving the base of the 5th metatarsal.  ABIs 12/30: RLE normal, LLE mild lower extremity arterial disease - He is on IV Ancef through PICC q8 hours x 42 days (through 09/15/18). Followed by [facility].  - Followed by wound clinic. Has f/u with ID on 1/23. 5. DM: Lantus stopped during recent admission due to several low blood sugars. A1C 6.0 this admission.  - Has seen PCP. No changes made.  6. Left eye blurry vision -  Saw eye doctor and has been referred to specialist.  - Carotid dopplers ok 7. A flutter.  Remains in A flutter/fib.  Continue carvedilol 12.5 mg twice a day + eliquis 5 mg twice a day. Set up for cardioversion.   Follow up in 3 weeks after cardioversion. Discussed cardioversion and EKG.   Darrick Grinder, NP 08/21/2018

## 2018-08-22 DIAGNOSIS — B957 Other staphylococcus as the cause of diseases classified elsewhere: Secondary | ICD-10-CM | POA: Diagnosis not present

## 2018-08-22 DIAGNOSIS — L97522 Non-pressure chronic ulcer of other part of left foot with fat layer exposed: Secondary | ICD-10-CM | POA: Diagnosis not present

## 2018-08-22 DIAGNOSIS — B954 Other streptococcus as the cause of diseases classified elsewhere: Secondary | ICD-10-CM | POA: Diagnosis not present

## 2018-08-22 DIAGNOSIS — R112 Nausea with vomiting, unspecified: Secondary | ICD-10-CM | POA: Diagnosis not present

## 2018-08-22 DIAGNOSIS — T373X5A Adverse effect of other antiprotozoal drugs, initial encounter: Secondary | ICD-10-CM | POA: Diagnosis not present

## 2018-08-22 DIAGNOSIS — E1140 Type 2 diabetes mellitus with diabetic neuropathy, unspecified: Secondary | ICD-10-CM | POA: Diagnosis not present

## 2018-08-22 DIAGNOSIS — I255 Ischemic cardiomyopathy: Secondary | ICD-10-CM | POA: Diagnosis not present

## 2018-08-22 DIAGNOSIS — E1169 Type 2 diabetes mellitus with other specified complication: Secondary | ICD-10-CM | POA: Diagnosis not present

## 2018-08-22 DIAGNOSIS — B9561 Methicillin susceptible Staphylococcus aureus infection as the cause of diseases classified elsewhere: Secondary | ICD-10-CM | POA: Diagnosis not present

## 2018-08-22 DIAGNOSIS — H5462 Unqualified visual loss, left eye, normal vision right eye: Secondary | ICD-10-CM | POA: Diagnosis not present

## 2018-08-22 DIAGNOSIS — M86372 Chronic multifocal osteomyelitis, left ankle and foot: Secondary | ICD-10-CM | POA: Diagnosis not present

## 2018-08-22 DIAGNOSIS — L97524 Non-pressure chronic ulcer of other part of left foot with necrosis of bone: Secondary | ICD-10-CM | POA: Diagnosis not present

## 2018-08-22 DIAGNOSIS — I110 Hypertensive heart disease with heart failure: Secondary | ICD-10-CM | POA: Diagnosis not present

## 2018-08-22 DIAGNOSIS — I5022 Chronic systolic (congestive) heart failure: Secondary | ICD-10-CM | POA: Diagnosis not present

## 2018-08-22 DIAGNOSIS — I2510 Atherosclerotic heart disease of native coronary artery without angina pectoris: Secondary | ICD-10-CM | POA: Diagnosis not present

## 2018-08-22 DIAGNOSIS — B9689 Other specified bacterial agents as the cause of diseases classified elsewhere: Secondary | ICD-10-CM | POA: Diagnosis not present

## 2018-08-22 DIAGNOSIS — E11621 Type 2 diabetes mellitus with foot ulcer: Secondary | ICD-10-CM | POA: Diagnosis not present

## 2018-08-25 ENCOUNTER — Telehealth (HOSPITAL_COMMUNITY): Payer: Self-pay

## 2018-08-25 DIAGNOSIS — M8610 Other acute osteomyelitis, unspecified site: Secondary | ICD-10-CM | POA: Diagnosis not present

## 2018-08-25 DIAGNOSIS — E1169 Type 2 diabetes mellitus with other specified complication: Secondary | ICD-10-CM | POA: Diagnosis not present

## 2018-08-25 DIAGNOSIS — Z792 Long term (current) use of antibiotics: Secondary | ICD-10-CM | POA: Diagnosis not present

## 2018-08-25 DIAGNOSIS — B9561 Methicillin susceptible Staphylococcus aureus infection as the cause of diseases classified elsewhere: Secondary | ICD-10-CM | POA: Diagnosis not present

## 2018-08-25 DIAGNOSIS — I5020 Unspecified systolic (congestive) heart failure: Secondary | ICD-10-CM | POA: Diagnosis not present

## 2018-08-25 NOTE — Telephone Encounter (Signed)
Beth [employer] banker, corporate) notified.

## 2018-08-25 NOTE — Telephone Encounter (Signed)
It is Afl[facility], he is set up for DCCV next week. Thank you

## 2018-08-25 NOTE — Telephone Encounter (Signed)
Beth (RN) called to report that pt's HR was 48 this morning, reports no dizziness. Beth believes pt is going in and out of Afib due to HR going low than normal. Beth states that pt seems fatigue, and weight going up from 198 to 202 in the last week. Labs were drawn today (CMeT, CBC, sedrate, CRP, CK). Please advise.

## 2018-08-26 DIAGNOSIS — A4902 Methicillin resistant Staphylococcus aureus infection, unspecified site: Secondary | ICD-10-CM | POA: Diagnosis not present

## 2018-08-26 DIAGNOSIS — I5020 Unspecified systolic (congestive) heart failure: Secondary | ICD-10-CM | POA: Diagnosis not present

## 2018-08-26 DIAGNOSIS — L03119 Cellulitis of unspecified part of limb: Secondary | ICD-10-CM | POA: Diagnosis not present

## 2018-08-26 DIAGNOSIS — E1169 Type 2 diabetes mellitus with other specified complication: Secondary | ICD-10-CM | POA: Diagnosis not present

## 2018-08-26 DIAGNOSIS — M86172 Other acute osteomyelitis, left ankle and foot: Secondary | ICD-10-CM | POA: Diagnosis not present
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
## 2018-08-27 ENCOUNTER — Ambulatory Visit (INDEPENDENT_AMBULATORY_CARE_PROVIDER_SITE_OTHER): Payer: BLUE CROSS/BLUE SHIELD | Admitting: Ophthalmology

## 2018-08-27 ENCOUNTER — Encounter (INDEPENDENT_AMBULATORY_CARE_PROVIDER_SITE_OTHER): Payer: Self-pay | Admitting: Ophthalmology

## 2018-08-27 DIAGNOSIS — E113311 Type 2 diabetes mellitus with moderate nonproliferative diabetic retinopathy with macular edema, right eye: Secondary | ICD-10-CM | POA: Diagnosis not present

## 2018-08-27 DIAGNOSIS — I10 Essential (primary) hypertension: Secondary | ICD-10-CM

## 2018-08-27 DIAGNOSIS — H35033 Hypertensive retinopathy, bilateral: Secondary | ICD-10-CM

## 2018-08-27 DIAGNOSIS — H3581 Retinal edema: Secondary | ICD-10-CM

## 2018-08-27 DIAGNOSIS — E113512 Type 2 diabetes mellitus with proliferative diabetic retinopathy with macular edema, left eye: Secondary | ICD-10-CM | POA: Diagnosis not present

## 2018-08-27 DIAGNOSIS — H4312 Vitreous hemorrhage, left eye: Secondary | ICD-10-CM

## 2018-08-27 DIAGNOSIS — H25813 Combined forms of age-related cataract, bilateral: Secondary | ICD-10-CM

## 2018-08-27 MED ORDER — BEVACIZUMAB CHEMO INJECTION 1.25MG/0.05ML SYRINGE FOR KALEIDOSCOPE
1.2500 mg | INTRAVITREAL | Status: DC
  Administered 2018-08-27: 1.25 mg via INTRAVITREAL

## 2018-08-28 ENCOUNTER — Encounter: Payer: Self-pay | Admitting: Infectious Diseases

## 2018-08-28 ENCOUNTER — Ambulatory Visit: Payer: BLUE CROSS/BLUE SHIELD | Admitting: Infectious Diseases

## 2018-08-28 DIAGNOSIS — M869 Osteomyelitis, unspecified: Secondary | ICD-10-CM | POA: Diagnosis not present

## 2018-08-28 DIAGNOSIS — N183 Chronic kidney disease, stage 3 unspecified: Secondary | ICD-10-CM

## 2018-08-28 DIAGNOSIS — I5042 Chronic combined systolic (congestive) and diastolic (congestive) heart failure: Secondary | ICD-10-CM

## 2018-08-28 DIAGNOSIS — E1122 Type 2 diabetes mellitus with diabetic chronic kidney disease: Secondary | ICD-10-CM | POA: Diagnosis not present

## 2018-08-28 DIAGNOSIS — Z794 Long term (current) use of insulin: Secondary | ICD-10-CM

## 2018-08-28 NOTE — Assessment & Plan Note (Addendum)
Appreciate his continued f/u at [facility] Will continue his anbx as planned, to 09-15-18.  Will see him back around that time.  Would consider that he will need po anbx at that time as his wound has not made much progress in terms of healing.  Will defer prognostics on need for amputation.  Will f/u his labs from pharm.

## 2018-08-28 NOTE — Assessment & Plan Note (Signed)
Well controlled by pt's hx.

## 2018-08-28 NOTE — Assessment & Plan Note (Signed)
His wt is up slightly.  He will continue to f/u with CHF clinic.

## 2018-08-28 NOTE — Progress Notes (Signed)
   Subjective:    Patient ID: Marcus Johnson, male    DOB: 1960-11-24, 58 y.o.   MRN: [id number]  HPI 58 y.o. male with hx of DM and CABG, chronic wound of his L foot. He was seen by wound care center, was referred to ID for concern of bone infection in his foot. He had CT done 12-2 showing early findings of osteo at 5th [place] base.  He states he was previously given po anbx, can't remember names.  He had superficial Cx 05-2018 and 07-2018 of his wound which both grew MSSA.  He was then reported 30# wt gain over 2-3 weeks by CHF clinic. In ED 12-27 he was noted to have 43# wt gain. His WBC was normal and he was afebrile.  He was seen in hospital and was started on ancef. He was d/c home on 1-2. His anbx end date is 09-15-18. He is having "a lot of pain" in his foot. Worse when he pressure on it. Wound is unchanged. Is having silver alginate applied at [facility].  No f/c.  His FSG have been "pretty good". His A1C was 6.0.  His wt is up 10# from 08-11-18.   Review of Systems  Constitutional: Negative for chills and fever.  Respiratory: Negative for cough and shortness of breath.   Cardiovascular: Negative for chest pain and leg swelling.  Gastrointestinal: Negative for constipation and diarrhea.  Genitourinary: Negative for difficulty urinating.       Objective:   Physical Exam Constitutional:      Appearance: Normal appearance.  HENT:     Mouth/Throat:     Mouth: Mucous membranes are moist.     Pharynx: No oropharyngeal exudate.  Eyes:     Extraocular Movements: Extraocular movements intact.  Neck:     Musculoskeletal: Normal range of motion and neck supple.  Cardiovascular:     Rate and Rhythm: Rhythm irregular.     Heart sounds: Murmur present.  Pulmonary:     Effort: Pulmonary effort is normal.     Breath sounds: Normal breath sounds.  Abdominal:     General: Bowel sounds are normal. There is no distension.     Palpations: Abdomen is soft.     Tenderness: There is no abdominal  tenderness.  Musculoskeletal:       Feet:  Neurological:     Mental Status: He is alert.     Comments: Ptosis L eye (had recent ophtho eval for post-retinal hemorrhage).   Psychiatric:        Mood and Affect: Mood normal.       Assessment & Plan:

## 2018-08-29 ENCOUNTER — Inpatient Hospital Stay: Payer: BLUE CROSS/BLUE SHIELD | Admitting: Infectious Diseases

## 2018-08-29 DIAGNOSIS — T373X5A Adverse effect of other antiprotozoal drugs, initial encounter: Secondary | ICD-10-CM | POA: Diagnosis not present

## 2018-08-29 DIAGNOSIS — I255 Ischemic cardiomyopathy: Secondary | ICD-10-CM | POA: Diagnosis not present

## 2018-08-29 DIAGNOSIS — E11621 Type 2 diabetes mellitus with foot ulcer: Secondary | ICD-10-CM | POA: Diagnosis not present

## 2018-08-29 DIAGNOSIS — E1140 Type 2 diabetes mellitus with diabetic neuropathy, unspecified: Secondary | ICD-10-CM | POA: Diagnosis not present

## 2018-08-29 DIAGNOSIS — E1169 Type 2 diabetes mellitus with other specified complication: Secondary | ICD-10-CM | POA: Diagnosis not present

## 2018-08-29 DIAGNOSIS — B9561 Methicillin susceptible Staphylococcus aureus infection as the cause of diseases classified elsewhere: Secondary | ICD-10-CM | POA: Diagnosis not present

## 2018-08-29 DIAGNOSIS — H5462 Unqualified visual loss, left eye, normal vision right eye: Secondary | ICD-10-CM | POA: Diagnosis not present

## 2018-08-29 DIAGNOSIS — R112 Nausea with vomiting, unspecified: Secondary | ICD-10-CM | POA: Diagnosis not present

## 2018-08-29 DIAGNOSIS — B954 Other streptococcus as the cause of diseases classified elsewhere: Secondary | ICD-10-CM | POA: Diagnosis not present

## 2018-08-29 DIAGNOSIS — M86372 Chronic multifocal osteomyelitis, left ankle and foot: Secondary | ICD-10-CM | POA: Diagnosis not present

## 2018-08-29 DIAGNOSIS — I2510 Atherosclerotic heart disease of native coronary artery without angina pectoris: Secondary | ICD-10-CM | POA: Diagnosis not present

## 2018-08-29 DIAGNOSIS — B9689 Other specified bacterial agents as the cause of diseases classified elsewhere: Secondary | ICD-10-CM | POA: Diagnosis not present

## 2018-08-29 DIAGNOSIS — L97522 Non-pressure chronic ulcer of other part of left foot with fat layer exposed: Secondary | ICD-10-CM | POA: Diagnosis not present

## 2018-08-29 DIAGNOSIS — L97524 Non-pressure chronic ulcer of other part of left foot with necrosis of bone: Secondary | ICD-10-CM | POA: Diagnosis not present

## 2018-08-29 DIAGNOSIS — I110 Hypertensive heart disease with heart failure: Secondary | ICD-10-CM | POA: Diagnosis not present

## 2018-08-29 DIAGNOSIS — I5022 Chronic systolic (congestive) heart failure: Secondary | ICD-10-CM | POA: Diagnosis not present

## 2018-09-01 DIAGNOSIS — E1169 Type 2 diabetes mellitus with other specified complication: Secondary | ICD-10-CM | POA: Diagnosis not present

## 2018-09-03 ENCOUNTER — Ambulatory Visit (HOSPITAL_COMMUNITY): Payer: BLUE CROSS/BLUE SHIELD | Admitting: Anesthesiology

## 2018-09-03 ENCOUNTER — Ambulatory Visit (HOSPITAL_COMMUNITY)
Admission: RE | Admit: 2018-09-03 | Discharge: 2018-09-03 | Disposition: A | Discharge status: Home / Self Care | Payer: BLUE CROSS/BLUE SHIELD | Attending: Cardiology | Admitting: Cardiology

## 2018-09-03 ENCOUNTER — Encounter (HOSPITAL_COMMUNITY)
Admission: RE | Disposition: A | Discharge status: Home / Self Care | Payer: Self-pay | Source: Home / Self Care | Attending: Cardiology

## 2018-09-03 ENCOUNTER — Other Ambulatory Visit: Payer: Self-pay

## 2018-09-03 DIAGNOSIS — I130 Hypertensive heart and chronic kidney disease with heart failure and stage 1 through stage 4 chronic kidney disease, or unspecified chronic kidney disease: Secondary | ICD-10-CM | POA: Insufficient documentation

## 2018-09-03 DIAGNOSIS — Z95810 Presence of automatic (implantable) cardiac defibrillator: Secondary | ICD-10-CM | POA: Diagnosis not present

## 2018-09-03 DIAGNOSIS — M109 Gout, unspecified: Secondary | ICD-10-CM | POA: Diagnosis not present

## 2018-09-03 DIAGNOSIS — M869 Osteomyelitis, unspecified: Secondary | ICD-10-CM | POA: Diagnosis not present

## 2018-09-03 DIAGNOSIS — I255 Ischemic cardiomyopathy: Secondary | ICD-10-CM | POA: Diagnosis not present

## 2018-09-03 DIAGNOSIS — E1169 Type 2 diabetes mellitus with other specified complication: Secondary | ICD-10-CM | POA: Insufficient documentation

## 2018-09-03 DIAGNOSIS — E1140 Type 2 diabetes mellitus with diabetic neuropathy, unspecified: Secondary | ICD-10-CM | POA: Insufficient documentation

## 2018-09-03 DIAGNOSIS — I2510 Atherosclerotic heart disease of native coronary artery without angina pectoris: Secondary | ICD-10-CM | POA: Diagnosis not present

## 2018-09-03 DIAGNOSIS — Z7982 Long term (current) use of aspirin: Secondary | ICD-10-CM | POA: Insufficient documentation

## 2018-09-03 DIAGNOSIS — I252 Old myocardial infarction: Secondary | ICD-10-CM | POA: Insufficient documentation

## 2018-09-03 DIAGNOSIS — Z79899 Other long term (current) drug therapy: Secondary | ICD-10-CM | POA: Insufficient documentation

## 2018-09-03 DIAGNOSIS — E1122 Type 2 diabetes mellitus with diabetic chronic kidney disease: Secondary | ICD-10-CM | POA: Insufficient documentation

## 2018-09-03 DIAGNOSIS — I5022 Chronic systolic (congestive) heart failure: Secondary | ICD-10-CM | POA: Insufficient documentation

## 2018-09-03 DIAGNOSIS — H538 Other visual disturbances: Secondary | ICD-10-CM | POA: Diagnosis not present

## 2018-09-03 DIAGNOSIS — B9561 Methicillin susceptible Staphylococcus aureus infection as the cause of diseases classified elsewhere: Secondary | ICD-10-CM | POA: Insufficient documentation

## 2018-09-03 DIAGNOSIS — N183 Chronic kidney disease, stage 3 (moderate): Secondary | ICD-10-CM | POA: Insufficient documentation

## 2018-09-03 DIAGNOSIS — I4891 Unspecified atrial fibrillation: Secondary | ICD-10-CM | POA: Insufficient documentation

## 2018-09-03 DIAGNOSIS — Z7901 Long term (current) use of anticoagulants: Secondary | ICD-10-CM | POA: Insufficient documentation

## 2018-09-03 DIAGNOSIS — I4892 Unspecified atrial flutter: Secondary | ICD-10-CM | POA: Diagnosis not present

## 2018-09-03 DIAGNOSIS — I5042 Chronic combined systolic (congestive) and diastolic (congestive) heart failure: Secondary | ICD-10-CM | POA: Diagnosis not present

## 2018-09-03 DIAGNOSIS — Z951 Presence of aortocoronary bypass graft: Secondary | ICD-10-CM | POA: Diagnosis not present

## 2018-09-03 HISTORY — PX: CARDIOVERSION: SHX1299

## 2018-09-03 LAB — POCT I-STAT 4, (NA,K, GLUC, HGB,HCT)
Glucose, Bld: 116 mg/dL — ABNORMAL HIGH (ref 70–99)
HCT: 35 % — ABNORMAL LOW (ref 39.0–52.0)
Hemoglobin: 11.9 g/dL — ABNORMAL LOW (ref 13.0–17.0)
Potassium: 3.4 mmol/L — ABNORMAL LOW (ref 3.5–5.1)
Sodium: 143 mmol/L (ref 135–145)

## 2018-09-03 SURGERY — CARDIOVERSION
Anesthesia: General

## 2018-09-03 MED ORDER — POTASSIUM CHLORIDE CRYS ER 20 MEQ PO TBCR: 20.0000 meq | EXTENDED_RELEASE_TABLET | Freq: Every day | ORAL | 3 refills | Status: DC

## 2018-09-03 MED ORDER — PROPOFOL 10 MG/ML IV BOLUS: INTRAVENOUS | Status: DC | PRN

## 2018-09-03 MED ORDER — APIXABAN 5 MG PO TABS
5.0000 mg | ORAL_TABLET | Freq: Once | ORAL | Status: AC
  Administered 2018-09-03: 5 mg via ORAL
  Filled 2018-09-03: qty 1

## 2018-09-03 MED ORDER — METOLAZONE 2.5 MG PO TABS: ORAL_TABLET | ORAL | 3 refills | Status: DC

## 2018-09-03 MED ORDER — PHENYLEPHRINE 40 MCG/ML (10ML) SYRINGE FOR IV PUSH (FOR BLOOD PRESSURE SUPPORT)
PREFILLED_SYRINGE | INTRAVENOUS | Status: DC | PRN
  Administered 2018-09-03 (×2): 80 ug via INTRAVENOUS

## 2018-09-03 MED ORDER — PROPOFOL 10 MG/ML IV BOLUS
INTRAVENOUS | Status: DC | PRN
  Administered 2018-09-03: 50 mg via INTRAVENOUS

## 2018-09-03 MED ORDER — LIDOCAINE 2% (20 MG/ML) 5 ML SYRINGE
INTRAMUSCULAR | Status: DC | PRN
  Administered 2018-09-03: 80 mg via INTRAVENOUS

## 2018-09-03 MED ORDER — SODIUM CHLORIDE 0.9 % IV SOLN
INTRAVENOUS | Status: DC
  Administered 2018-09-03: 08:00:00 via INTRAVENOUS

## 2018-09-03 NOTE — Procedures (Signed)
Electrical Cardioversion Procedure Note Abdishakur Gottschall [id number] 1961-05-30  Procedure: Electrical Cardioversion Indications:  Atrial Fibrillation  Procedure Details Consent: Risks of procedure as well as the alternatives and risks of each were explained to the (patient/caregiver).  Consent for procedure obtained. Time Out: Verified patient identification, verified procedure, site/side was marked, verified correct patient position, special equipment/implants available, medications/allergies/relevent history reviewed, required imaging and test results available.  Performed  Patient placed on cardiac monitor, pulse oximetry, supplemental oxygen as necessary.  Sedation given: Propofol per anesthesiology Pacer pads placed anterior and posterior chest.  Cardioverted 1 time(s).  Cardioverted at [place].  Evaluation Findings: Post procedure EKG shows: NSR Complications: None Patient did tolerate procedure well.   Loralie Champagne 09/03/2018, 9:20 AM

## 2018-09-03 NOTE — Transfer of Care (Signed)
Immediate Anesthesia Transfer of Care Note  Patient: Marcus Johnson.  Procedure(s) Performed: CARDIOVERSION (N/A )  Patient Location: Endoscopy Unit  Anesthesia Type:General  Level of Consciousness: drowsy  Airway & Oxygen Therapy: Patient Spontanous Breathing  Post-op Assessment: Report given to RN and Post -op Vital signs reviewed and stable  Post vital signs: Reviewed and stable  Last Vitals:  Vitals Value Taken Time  BP 101/65   Temp    Pulse 84   Resp 16   SpO2 95     Last Pain:  Vitals:   09/03/18 0755  TempSrc: Oral  PainSc:          Complications: No apparent anesthesia complications

## 2018-09-03 NOTE — Discharge Instructions (Signed)
Electrical Cardioversion, Care After °This sheet gives you information about how to care for yourself after your procedure. Your health care provider may also give you more specific instructions. If you have problems or questions, contact your health care provider. °What can I expect after the procedure? °After the procedure, it is common to have: °· Some redness on the skin where the shocks were given. °Follow these instructions at home: ° °· Do not drive for 24 hours if you were given a medicine to help you relax (sedative). °· Take over-the-counter and prescription medicines only as told by your health care provider. °· Ask your health care provider how to check your pulse. Check it often. °· Rest for 48 hours after the procedure or as told by your health care provider. °· Avoid or limit your caffeine use as told by your health care provider. °Contact a health care provider if: °· You feel like your heart is beating too quickly or your pulse is not regular. °· You have a serious muscle cramp that does not go away. °Get help right away if: ° °· You have discomfort in your chest. °· You are dizzy or you feel faint. °· You have trouble breathing or you are short of breath. °· Your speech is slurred. °· You have trouble moving an arm or leg on one side of your body. °· Your fingers or toes turn cold or blue. °This information is not intended to replace advice given to you by your health care provider. Make sure you discuss any questions you have with your health care provider. °Document Released: 05/13/2013 Document Revised: 02/24/2016 Document Reviewed: 01/27/2016 °Elsevier Interactive Patient Education © 2019 [employer] ° °

## 2018-09-03 NOTE — Interval H&P Note (Signed)
History and Physical Interval Note:  09/03/2018 9:09 AM  Marcus Johnson.  has presented today for surgery, with the diagnosis of a-fib  The various methods of treatment have been discussed with the patient and family. After consideration of risks, benefits and other options for treatment, the patient has consented to  Procedure(s): CARDIOVERSION (N/A) as a surgical intervention .  The patient's history has been reviewed, patient examined, no change in status, stable for surgery.  I have reviewed the patient's chart and labs.  Questions were answered to the patient's satisfaction.     Majour Frei [employer]

## 2018-09-03 NOTE — Anesthesia Postprocedure Evaluation (Signed)
Anesthesia Post Note  Patient: Marcus Johnson.  Procedure(s) Performed: CARDIOVERSION (N/A )     Patient location during evaluation: Endoscopy Anesthesia Type: General Level of consciousness: awake and alert Pain management: pain level controlled Vital Signs Assessment: post-procedure vital signs reviewed and stable Respiratory status: spontaneous breathing, nonlabored ventilation and respiratory function stable Cardiovascular status: blood pressure returned to baseline and stable Postop Assessment: no apparent nausea or vomiting Anesthetic complications: no    Last Vitals:  Vitals:   09/03/18 0755  BP: (!) 132/92  Pulse: (!) 114  Resp: 18  Temp: 36.8 C  SpO2: 100%    Last Pain:  Vitals:   09/03/18 0755  TempSrc: Oral  PainSc:                  Lidia Collum

## 2018-09-03 NOTE — Anesthesia Preprocedure Evaluation (Addendum)
Anesthesia Evaluation  Patient identified by MRN, date of birth, ID band Patient awake    Reviewed: Allergy & Precautions, NPO status , Patient's Chart, lab work & pertinent test results  History of Anesthesia Complications Negative for: history of anesthetic complications  Airway Mallampati: II  TM Distance: >3 FB Neck ROM: Full    Dental no notable dental hx.    Pulmonary neg pulmonary ROS,    Pulmonary exam normal        Cardiovascular hypertension, + CAD, + Past MI, + CABG and +CHF  + dysrhythmias Atrial Fibrillation + Cardiac Defibrillator  Rhythm:Irregular Rate:Tachycardia     Neuro/Psych negative neurological ROS  negative psych ROS   GI/Hepatic negative GI ROS, Neg liver ROS,   Endo/Other  diabetes, Insulin Dependent  Renal/GU Renal InsufficiencyRenal disease  negative genitourinary   Musculoskeletal negative musculoskeletal ROS (+)   Abdominal   Peds  Hematology negative hematology ROS (+)   Anesthesia Other Findings 58 yo M for DCCV - HTN, AF, CAD s/p CABG (2002), CHF s/p AICD (EF 20-25%), pulm HTN, IDDM, CKD3 - TTE 08/04/18: EF 20-25%, grade 2 dd, PAP 49 - CPX 11/25/17: Mildly submaximal tests but clear evidence of a severe HF limitation with multiple high-risk features. Obesity and chronotropic incompetence also limiting factors - has been recommended for LVAD/txp but pt is resistant  Reproductive/Obstetrics                            Anesthesia Physical Anesthesia Plan  ASA: IV  Anesthesia Plan: General   Post-op Pain Management:    Induction:   PONV Risk Score and Plan: 2 and TIVA and Treatment may vary due to age or medical condition  Airway Management Planned: Mask  Additional Equipment: None  Intra-op Plan:   Post-operative Plan:   Informed Consent: I have reviewed the patients History and Physical, chart, labs and discussed the procedure including the  risks, benefits and alternatives for the proposed anesthesia with the patient or authorized representative who has indicated his/her understanding and acceptance.       Plan Discussed with:   Anesthesia Plan Comments:        Anesthesia Quick Evaluation

## 2018-09-04 DIAGNOSIS — M86172 Other acute osteomyelitis, left ankle and foot: Secondary | ICD-10-CM | POA: Diagnosis not present

## 2018-09-04 DIAGNOSIS — I5020 Unspecified systolic (congestive) heart failure: Secondary | ICD-10-CM | POA: Diagnosis not present

## 2018-09-04 DIAGNOSIS — L03119 Cellulitis of unspecified part of limb: Secondary | ICD-10-CM | POA: Diagnosis not present

## 2018-09-04 DIAGNOSIS — E1169 Type 2 diabetes mellitus with other specified complication: Secondary | ICD-10-CM | POA: Diagnosis not present

## 2018-09-04 DIAGNOSIS — A4902 Methicillin resistant Staphylococcus aureus infection, unspecified site: Secondary | ICD-10-CM | POA: Diagnosis not present

## 2018-09-05 ENCOUNTER — Encounter (HOSPITAL_COMMUNITY): Payer: Self-pay | Admitting: Cardiology

## 2018-09-05 DIAGNOSIS — I5022 Chronic systolic (congestive) heart failure: Secondary | ICD-10-CM | POA: Diagnosis not present

## 2018-09-05 DIAGNOSIS — B9689 Other specified bacterial agents as the cause of diseases classified elsewhere: Secondary | ICD-10-CM | POA: Diagnosis not present

## 2018-09-05 DIAGNOSIS — E1169 Type 2 diabetes mellitus with other specified complication: Secondary | ICD-10-CM | POA: Diagnosis not present

## 2018-09-05 DIAGNOSIS — I255 Ischemic cardiomyopathy: Secondary | ICD-10-CM | POA: Diagnosis not present

## 2018-09-05 DIAGNOSIS — B9561 Methicillin susceptible Staphylococcus aureus infection as the cause of diseases classified elsewhere: Secondary | ICD-10-CM | POA: Diagnosis not present

## 2018-09-05 DIAGNOSIS — L97524 Non-pressure chronic ulcer of other part of left foot with necrosis of bone: Secondary | ICD-10-CM | POA: Diagnosis not present

## 2018-09-05 DIAGNOSIS — E11621 Type 2 diabetes mellitus with foot ulcer: Secondary | ICD-10-CM | POA: Diagnosis not present

## 2018-09-05 DIAGNOSIS — T373X5A Adverse effect of other antiprotozoal drugs, initial encounter: Secondary | ICD-10-CM | POA: Diagnosis not present

## 2018-09-05 DIAGNOSIS — R112 Nausea with vomiting, unspecified: Secondary | ICD-10-CM | POA: Diagnosis not present

## 2018-09-05 DIAGNOSIS — L97522 Non-pressure chronic ulcer of other part of left foot with fat layer exposed: Secondary | ICD-10-CM | POA: Diagnosis not present

## 2018-09-05 DIAGNOSIS — I110 Hypertensive heart disease with heart failure: Secondary | ICD-10-CM | POA: Diagnosis not present

## 2018-09-05 DIAGNOSIS — E1140 Type 2 diabetes mellitus with diabetic neuropathy, unspecified: Secondary | ICD-10-CM | POA: Diagnosis not present

## 2018-09-05 DIAGNOSIS — B954 Other streptococcus as the cause of diseases classified elsewhere: Secondary | ICD-10-CM | POA: Diagnosis not present

## 2018-09-05 DIAGNOSIS — H5462 Unqualified visual loss, left eye, normal vision right eye: Secondary | ICD-10-CM | POA: Diagnosis not present

## 2018-09-05 DIAGNOSIS — I2510 Atherosclerotic heart disease of native coronary artery without angina pectoris: Secondary | ICD-10-CM | POA: Diagnosis not present

## 2018-09-05 DIAGNOSIS — M86372 Chronic multifocal osteomyelitis, left ankle and foot: Secondary | ICD-10-CM | POA: Diagnosis not present

## 2018-09-08 ENCOUNTER — Encounter: Payer: Self-pay | Admitting: Infectious Diseases

## 2018-09-08 DIAGNOSIS — M869 Osteomyelitis, unspecified: Secondary | ICD-10-CM | POA: Diagnosis not present

## 2018-09-08 DIAGNOSIS — E1169 Type 2 diabetes mellitus with other specified complication: Secondary | ICD-10-CM | POA: Diagnosis not present

## 2018-09-08 DIAGNOSIS — Z452 Encounter for adjustment and management of vascular access device: Secondary | ICD-10-CM | POA: Diagnosis not present

## 2018-09-08 DIAGNOSIS — Z5181 Encounter for therapeutic drug level monitoring: Secondary | ICD-10-CM | POA: Diagnosis not present
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
## 2018-09-10 ENCOUNTER — Ambulatory Visit (INDEPENDENT_AMBULATORY_CARE_PROVIDER_SITE_OTHER): Payer: BLUE CROSS/BLUE SHIELD | Admitting: Ophthalmology

## 2018-09-10 DIAGNOSIS — H3581 Retinal edema: Secondary | ICD-10-CM

## 2018-09-10 DIAGNOSIS — E113411 Type 2 diabetes mellitus with severe nonproliferative diabetic retinopathy with macular edema, right eye: Secondary | ICD-10-CM

## 2018-09-10 DIAGNOSIS — H4312 Vitreous hemorrhage, left eye: Secondary | ICD-10-CM | POA: Diagnosis not present

## 2018-09-10 DIAGNOSIS — H35033 Hypertensive retinopathy, bilateral: Secondary | ICD-10-CM

## 2018-09-10 DIAGNOSIS — E113512 Type 2 diabetes mellitus with proliferative diabetic retinopathy with macular edema, left eye: Secondary | ICD-10-CM | POA: Diagnosis not present

## 2018-09-10 DIAGNOSIS — H25813 Combined forms of age-related cataract, bilateral: Secondary | ICD-10-CM

## 2018-09-10 DIAGNOSIS — I10 Essential (primary) hypertension: Secondary | ICD-10-CM

## 2018-09-10 MED ORDER — PREDNISOLONE ACETATE 1 % OP SUSP: OPHTHALMIC | 0 refills | Status: DC

## 2018-09-11 ENCOUNTER — Ambulatory Visit (HOSPITAL_COMMUNITY)
Admission: RE | Admit: 2018-09-11 | Discharge: 2018-09-11 | Disposition: A | Discharge status: Home / Self Care | Payer: BLUE CROSS/BLUE SHIELD | Source: Ambulatory Visit | Attending: Internal Medicine | Admitting: Internal Medicine

## 2018-09-11 ENCOUNTER — Encounter (HOSPITAL_COMMUNITY): Payer: Self-pay

## 2018-09-11 VITALS — BP 118/66 | HR 79 | Wt 204.4 lb

## 2018-09-11 DIAGNOSIS — I252 Old myocardial infarction: Secondary | ICD-10-CM | POA: Diagnosis not present

## 2018-09-11 DIAGNOSIS — M109 Gout, unspecified: Secondary | ICD-10-CM | POA: Diagnosis not present

## 2018-09-11 DIAGNOSIS — E1169 Type 2 diabetes mellitus with other specified complication: Secondary | ICD-10-CM | POA: Insufficient documentation

## 2018-09-11 DIAGNOSIS — Z794 Long term (current) use of insulin: Secondary | ICD-10-CM | POA: Insufficient documentation

## 2018-09-11 DIAGNOSIS — E1140 Type 2 diabetes mellitus with diabetic neuropathy, unspecified: Secondary | ICD-10-CM | POA: Diagnosis not present

## 2018-09-11 DIAGNOSIS — E1122 Type 2 diabetes mellitus with diabetic chronic kidney disease: Secondary | ICD-10-CM | POA: Diagnosis not present

## 2018-09-11 DIAGNOSIS — Z7952 Long term (current) use of systemic steroids: Secondary | ICD-10-CM | POA: Insufficient documentation

## 2018-09-11 DIAGNOSIS — N183 Chronic kidney disease, stage 3 unspecified: Secondary | ICD-10-CM

## 2018-09-11 DIAGNOSIS — I4892 Unspecified atrial flutter: Secondary | ICD-10-CM | POA: Diagnosis not present

## 2018-09-11 DIAGNOSIS — B9561 Methicillin susceptible Staphylococcus aureus infection as the cause of diseases classified elsewhere: Secondary | ICD-10-CM | POA: Insufficient documentation

## 2018-09-11 DIAGNOSIS — I2510 Atherosclerotic heart disease of native coronary artery without angina pectoris: Secondary | ICD-10-CM | POA: Diagnosis not present

## 2018-09-11 DIAGNOSIS — I5022 Chronic systolic (congestive) heart failure: Secondary | ICD-10-CM | POA: Insufficient documentation

## 2018-09-11 DIAGNOSIS — Z833 Family history of diabetes mellitus: Secondary | ICD-10-CM | POA: Diagnosis not present

## 2018-09-11 DIAGNOSIS — Z79899 Other long term (current) drug therapy: Secondary | ICD-10-CM | POA: Insufficient documentation

## 2018-09-11 DIAGNOSIS — I130 Hypertensive heart and chronic kidney disease with heart failure and stage 1 through stage 4 chronic kidney disease, or unspecified chronic kidney disease: Secondary | ICD-10-CM | POA: Insufficient documentation

## 2018-09-11 DIAGNOSIS — M869 Osteomyelitis, unspecified: Secondary | ICD-10-CM | POA: Insufficient documentation

## 2018-09-11 DIAGNOSIS — Z7982 Long term (current) use of aspirin: Secondary | ICD-10-CM | POA: Insufficient documentation

## 2018-09-11 DIAGNOSIS — H538 Other visual disturbances: Secondary | ICD-10-CM | POA: Diagnosis not present

## 2018-09-11 DIAGNOSIS — Z7901 Long term (current) use of anticoagulants: Secondary | ICD-10-CM | POA: Insufficient documentation

## 2018-09-11 DIAGNOSIS — Z8249 Family history of ischemic heart disease and other diseases of the circulatory system: Secondary | ICD-10-CM | POA: Diagnosis not present

## 2018-09-11 DIAGNOSIS — I480 Paroxysmal atrial fibrillation: Secondary | ICD-10-CM

## 2018-09-11 DIAGNOSIS — Z951 Presence of aortocoronary bypass graft: Secondary | ICD-10-CM | POA: Diagnosis not present

## 2018-09-11 DIAGNOSIS — I255 Ischemic cardiomyopathy: Secondary | ICD-10-CM | POA: Insufficient documentation

## 2018-09-11 DIAGNOSIS — Z95810 Presence of automatic (implantable) cardiac defibrillator: Secondary | ICD-10-CM | POA: Insufficient documentation

## 2018-09-11 NOTE — Patient Instructions (Signed)
No medication changes today!  Your physician recommends that you schedule a follow-up appointment in: 2 months with Dr. Aundra Dubin  Do the following things EVERYDAY: 1) Weigh yourself in the morning before breakfast. Write it down and keep it in a log. 2) Take your medicines as prescribed 3) Eat low salt foods-Limit salt (sodium) to 2000 mg per day.  4) Stay as active as you can everyday 5) Limit all fluids for the day to less than 2 liters

## 2018-09-11 NOTE — Progress Notes (Signed)
Patient ID: Marcus Johnson., male   DOB: [birth year], 58 y.o.   MRN: [id number] PCP: Dr. Sarajane Jews Cardiology: Dr. Aundra Dubin  HPI: Marcus Johnson is a 58 y.o. male with h/o DM2, CAD s/p CABG 5852, systolic HF due to ischemic cardiomyopathy with EF 20-25% (echo 12/15), DM2 and CKD. He is s/p Medtronic single chamber ICD.  Admitted in 12/15 due to ADHF. Required short course milrinone for diuresis. Diuresed 30 pounds.   CPX 2/16 showed severely reduced functional capacity.  There was a significant disconnect between his symptoms and the CPX.  RHC in 6/16 showed fairly normal filling pressures and low but not markedly low cardiac output.   CPX (4/19) was submaximal but suggested severe limitation from HF.   He was admitted in 6/19 with marked volume overload and dyspnea.  Echo in 6/19 showed EF 20% with severe global hypokinesis.  LHC/RHC showed severe native vessel CAD with patent LIMA-D, SVG-OM, and SVG-PLV; cardiac index low.  He was started on milrinone and diuresed.  We discussed LVAD extensively in light of low output, creatinine that is trending up and concern for decompensation of RV over time. He was adamant that he did not want to pursue LVAD workup yet and did not want a referral for transplant evaluation. Milrinone was weaned off and he was discharged home.   Admitted 08/01/18-08/07/18 with volume overload and left foot wound. Diuresed 38 lbs with IV lasix and then transitioned back to torsemide 100 mg BID. ID was consulted with concerns for left foot osteomyelitis noted on recent CT scan. He will be on IV ancef q8 hours x 42 days. [facility] provided teaching and supplies for home antibiotics. Course complicated by AKI. DC weight 200 lbs.   Today he returns for HF follow. Earlier this week he underwent an eye procedure. Vision remains blurry.  Overall feeling fine but having ongoing left foot pain. Denies SOB/PND/Orthopnea. Appetite ok. No fever or chills. Weight at home has been stable.  Taking all  medications. Followed by [facility] for home antibiotics.    Studies: Echo (12/15): EF 20-25% RV moderately HK CPX (2/16): RER 1.10, peak VO2 8.6, VE/VCO2 slope 43.7 => severely reduced functional capacity.  RHC (6/16): mean RA 8, PA 48/19 mean 30, mean PCWP 14, CI 2.03 Fick/2.28 Thermo, PVR 3.6.  Echo (5/17): EF 25%, mild LV dilation, moderate diastolic dysfunction, normal RV size with mild to moderate systolic dysfunction, mild mitral regurgitation CPX (4/19): Peak VO2 7, VE/VCO2 slope 41, RER 1.02 => submaximal but still suggestive of severe HF limitation.  Echo (6/19): EF 20%, diffuse severe HK, moderately decreased RV systolic function, moderate TR.  LHC/RHC (6/19): Severe native vessel CAD with patent LIMA-D, SVG-OM, and SVG-PLV; mean RA 15, PA 54/24 mean 37, mean PCWP 32, CI 1.86 Fick/1.64 thermo. PAPi 2, PVR 1.25 WU.  DC-CV 09/03/18 -->NSR   Labs:  8/15 LDL 88 12/15 K+ 4.2 creatinine 1.09  1/16 K 4.3, creatinine 1.34 4/16 K 4.5, creatinine 1.19 3/17 K 3.8, creatinine 1.22, HCT 35.3, BNP 1597 5/17 K 4.1, creatinine 1.25 6/18 LDL 138, K 4.4, creatinine 1.5 6/19 hgb 13.5, K 4.9, creatinine 1.53 => 1.5 => 1.4, BNP 992 08/07/2018: K 4.3 Creatinine 1.86   Review of systems complete and found to be negative unless listed in HPI.   SH:  Social History   Socioeconomic History  . Marital status: Divorced    Spouse name: Not on file  . Number of children: Not on file  . Years of education:  Not on file  . Highest education level: Not on file  Occupational History  . Not on file  Social Needs  . Financial resource strain: Not on file  . Food insecurity:    Worry: Not on file    Inability: Not on file  . Transportation needs:    Medical: Not on file    Non-medical: Not on file  Tobacco Use  . Smoking status: Never Smoker  . Smokeless tobacco: Never Used  Substance and Sexual Activity  . Alcohol use: No    Alcohol/week: 0.0 standard drinks  . Drug use: No  . Sexual activity: Not on  file  Lifestyle  . Physical activity:    Days per week: Not on file    Minutes per session: Not on file  . Stress: Not on file  Relationships  . Social connections:    Talks on phone: Not on file    Gets together: Not on file    Attends religious service: Not on file    Active member of club or organization: Not on file    Attends meetings of clubs or organizations: Not on file    Relationship status: Not on file  . Intimate partner violence:    Fear of current or ex partner: Not on file    Emotionally abused: Not on file    Physically abused: Not on file    Forced sexual activity: Not on file  Other Topics Concern  . Not on file  Social History Narrative   Lives in [place] with his son and his family.  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    FH:  Family History  Problem Relation Age of Onset  . Diabetes Father        died in his 60's  . Hypertension Mother        died in her 47's - had a ppm.  . Amblyopia Neg Hx   . Blindness Neg Hx   . Cataracts Neg Hx   . Glaucoma Neg Hx   . Macular degeneration Neg Hx   . Retinal detachment Neg Hx   . Strabismus Neg Hx   . Retinitis pigmentosa Neg Hx     Past Medical History:  Diagnosis Date  . AICD (automatic cardioverter/defibrillator) present    Single-chamber  implantable cardiac defibrillator - Medtronic  . CHF (congestive heart failure) ([place])   . Chronic systolic heart failure ([place])    a. Echo 5/13: Mild LVE, mild LVH, EF 10%, anteroseptal, lateral, apical AK, mild MR, mild LAE, moderately reduced RVSF, mild RAE, PASP 60;  b. 07/2014 Echo: EF 20-2%, diff HK, AKI of antsep/apical/mid-apicalinferior, mod reduced RV.  Marland Kitchen Coronary artery disease    a. s/p CABG 2002 b. LHC 5/13:  dLM 80%, LAD subtotally occluded, pCFX occluded, pRCA 50%, mid? Occlusion with high take off of the PDA with 70% multiple lesions-not bypassed and supplies collaterals to LAD, LIMA-IM/ramus ok, S-OM ok, S-PLA branches ok.  Medical therapy was recommended  . Gout    "on daily RX" (01/08/2018)  . Hypertension   . Ischemic cardiomyopathy    a. Echo 5/13: Mild LVE, mild LVH, EF 10%, anteroseptal, lateral, apical AK, mild MR, mild LAE, moderately reduced RVSF, mild RAE, PASP 60;  b. 01/2012 s/p MDT [id number] Protecta XT VR AICD;  c. 07/2014 Echo: EF 20-2%, diff HK, AKI of antsep/apical/mid-apicalinferior, mod reduced RV.  Marland Kitchen MRSA (methicillin resistant Staphylococcus aureus)    Status post  right foot plantar deep infection with MRSA status post  I&D 10/2008  . Myocardial infarction [facility])    "was told I'd had several before heart OR in 2002" (01/08/2018)  . Noncompliance   . Peripheral neuropathy   . Syncope   . Type II diabetes mellitus ([place])    requiring insulin     Current Outpatient Medications  Medication Sig Dispense Refill  . allopurinol (ZYLOPRIM) 300 MG tablet Take 2 tablets (600 mg total) by mouth daily. (Patient taking differently: Take 600 mg by mouth daily as needed (gout). ) 180 tablet 3  . apixaban (ELIQUIS) 5 MG TABS tablet Take 1 tablet (5 mg total) by mouth 2 (two) times daily. 60 tablet 6  . BAYER MICROLET LANCETS lancets Use as instructed 100 each 11  . carvedilol (COREG) 12.5 MG tablet Take 1 tablet (12.5 mg total) by mouth 2 (two) times daily. 30 tablet 11  . ceFAZolin (ANCEF) IVPB Inject 1 g into the vein every 8 (eight) hours. Indication:  Osteomyelitis Last Day of Therapy:  09/15/2018 Labs - Once weekly:  CBC/D and BMP, Labs - Every other week:  ESR and CRP 126 Units 0  . digoxin (LANOXIN) 0.125 MG tablet Take 1 tablet (0.125 mg total) by mouth daily. 30 tablet 3  . insulin aspart (NOVOLOG FLEXPEN) 100 UNIT/ML FlexPen Inject 5 units into the skin 3 times daily with meals.  Dx code: E11.9 15 mL 11  . Insulin Pen Needle (BD PEN NEEDLE NANO U/F) 32G X 4 MM MISC USE 4 TIMES DAILY 100 each 11  . metolazone (ZAROXOLYN) 2.5 MG tablet Take 1 tablet 1/30 and 1/31 30 minutes prior to torsemide. Then start  1 tablet (2.5 mg) weekly on Wednesdays with extra 20 meq of K. 15 tablet 3  . potassium chloride SA (K-DUR,KLOR-CON) 20 MEQ tablet Take 1 tablet (20 mEq total) by mouth daily. Take 1 tablet extra on days you take metolazone. 90 tablet 3  . prednisoLONE acetate (PRED FORTE) 1 % ophthalmic suspension Use 1 drop 4 times daily in the right eye for 7 days then stop 10 mL 0  . sacubitril-valsartan (ENTRESTO) 49-51 MG Take 1 tablet by mouth 2 (two) times daily. 60 tablet 6  . spironolactone (ALDACTONE) 25 MG tablet Take 1 tablet (25 mg total) by mouth daily. 30 tablet 11  . torsemide (DEMADEX) 20 MG tablet TAKE 5 TABLETS BY MOUTH TWICE A DAY (Patient taking differently: Take 100 mg by mouth 2 (two) times daily. ) 240 tablet 6  . rosuvastatin (CRESTOR) 40 MG tablet Take 1 tablet (40 mg total) by mouth daily. 30 tablet 11   Current Facility-Administered Medications  Medication Dose Route Frequency Provider Last Rate Last Dose  . Bevacizumab (AVASTIN) SOLN 1.25 mg  1.25 mg Intravitreal  [name], MD   1.25 mg at 08/27/18 2139    Vitals:   09/11/18 1427  BP: 118/66  Pulse: 79  SpO2: 96%  Weight: 92.7 kg (204 lb 5.8 oz)   Wt Readings from Last 3 Encounters:  09/11/18 92.7 kg (204 lb 5.8 oz)  09/03/18 92.5 kg (204 lb)  08/28/18 93.9 kg (207 lb)    PHYSICAL EXAM: General:  Well appearing. No resp difficulty. Walked in the clinic HEENT: normal Neck: supple. no JVD. Carotids 2+ bilat; no bruits. No lymphadenopathy or thryomegaly appreciated. Cor: PMI nondisplaced. Regular rate & rhythm. No rubs, gallops or murmurs. Lungs: clear Abdomen: soft, nontender, nondistended. No hepatosplenomegaly. No bruits or masses. Good bowel sounds.  Extremities: no cyanosis, clubbing, rash, edema. L foot dressing. RUE PICC  Neuro: alert & orientedx3, cranial nerves grossly intact. moves all 4 extremities w/o difficulty. Affect pleasant  EKG: NSR occasional PVC 78 bpm   ASSESSMENT & PLAN: 1.Chronic systolic  HF:ICM.HasMedtronic ICD. Narrow QRS, not CRT candidate. Has required milrinone multiple times in the past for low output HF. Offered advanced therapies in past but declined. Echo 6/19 with EF 20% and moderately decreased RV systolic function.CPX 4/19 with severe functional limitation due to [facility] 6/19 showed low output and coronary angiography showed no interventional targets.Echo 08/04/18: EF 20-25%, mild LVH, diffuse HK, akinesis of anteroseptal and anterior myocardium, grade 2 DD, trivial AI, LA mod dilated, RV moderately dilated, RA mildly dilated, moderate TR, PA peak pressure 49 mm Hg. NYHA II . Volume status stable. Continue torsemide 100 mg BID + K 20 meq daily. - Continue Entresto49/51 mg BID - Continuespironolactone25 mg daily.  -Intolerant ivabradine due to nausea. -Continue coreg to 12.5 mg BID.  - Continue digoxin0.125 mg daily. Dig level <0.2 07/2018 - Has been very resistant to LVAD/transplant evaluation for both offered in past. Now with left foot osteomyelitis, will need treatment/resolution before considering LVAD even if he would agree to evaluation.  2. CAD s/p CABG2002:Last cath in 6/19 with patent grafts. -No s/s ischemia.   - Continue ASA, statin 3. CKD Stage 3:Baseline creatinine 1.3-1.5.  - Continue weekly BMET . 4.Left foot wound: MSSA osteomyelitis. CT left foot 07/07/18: suspicion for early osteomyelitis involving the base of the 5th metatarsal.  ABIs 12/30: RLE normal, LLE mild lower extremity arterial disease - He is on IV Ancef through PICC q8 hours x 42 days (through 09/15/18). Followed by [facility].  - Followed by wound clinic.  5. DM: Lantus stopped during recent admission due to several low blood sugars. A1C 6.0 this admission.  - Has seen PCP. No changes made.  6. Left eye blurry vision --Remains blurry.  - Carotid dopplers ok - Followed closely by opthalmology.  7. A flutter.  S/P DC-CV 08/21/18 EKG today shows NSR Continue carvedilol  12.5 mg twice a day + eliquis 5 mg twice a day.  Discussed importance of anticoagulation.    Follow up in 2 months with Dr Aundra Dubin. Instructed to call for an earlier appointment if he develops increase shortness of breath or weight gain.     Darrick Grinder, NP 09/11/2018

## 2018-09-12 ENCOUNTER — Encounter (HOSPITAL_BASED_OUTPATIENT_CLINIC_OR_DEPARTMENT_OTHER): Payer: BLUE CROSS/BLUE SHIELD | Attending: Internal Medicine

## 2018-09-12 DIAGNOSIS — I509 Heart failure, unspecified: Secondary | ICD-10-CM | POA: Insufficient documentation

## 2018-09-12 DIAGNOSIS — B9561 Methicillin susceptible Staphylococcus aureus infection as the cause of diseases classified elsewhere: Secondary | ICD-10-CM | POA: Diagnosis not present

## 2018-09-12 DIAGNOSIS — I2510 Atherosclerotic heart disease of native coronary artery without angina pectoris: Secondary | ICD-10-CM | POA: Diagnosis not present

## 2018-09-12 DIAGNOSIS — I110 Hypertensive heart disease with heart failure: Secondary | ICD-10-CM | POA: Insufficient documentation

## 2018-09-12 DIAGNOSIS — L97526 Non-pressure chronic ulcer of other part of left foot with bone involvement without evidence of necrosis: Secondary | ICD-10-CM | POA: Diagnosis not present

## 2018-09-12 DIAGNOSIS — E11621 Type 2 diabetes mellitus with foot ulcer: Secondary | ICD-10-CM | POA: Insufficient documentation

## 2018-09-12 DIAGNOSIS — E1140 Type 2 diabetes mellitus with diabetic neuropathy, unspecified: Secondary | ICD-10-CM | POA: Insufficient documentation

## 2018-09-12 DIAGNOSIS — L97522 Non-pressure chronic ulcer of other part of left foot with fat layer exposed: Secondary | ICD-10-CM | POA: Diagnosis not present

## 2018-09-14 ENCOUNTER — Encounter (INDEPENDENT_AMBULATORY_CARE_PROVIDER_SITE_OTHER): Payer: Self-pay | Admitting: Ophthalmology

## 2018-09-15 DIAGNOSIS — M869 Osteomyelitis, unspecified: Secondary | ICD-10-CM | POA: Diagnosis not present

## 2018-09-15 DIAGNOSIS — B9561 Methicillin susceptible Staphylococcus aureus infection as the cause of diseases classified elsewhere: Secondary | ICD-10-CM | POA: Diagnosis not present

## 2018-09-15 DIAGNOSIS — E1169 Type 2 diabetes mellitus with other specified complication: Secondary | ICD-10-CM | POA: Diagnosis not present

## 2018-09-15 DIAGNOSIS — I5020 Unspecified systolic (congestive) heart failure: Secondary | ICD-10-CM | POA: Diagnosis not present

## 2018-09-15 DIAGNOSIS — Z792 Long term (current) use of antibiotics: Secondary | ICD-10-CM | POA: Diagnosis not present

## 2018-09-16 ENCOUNTER — Ambulatory Visit (INDEPENDENT_AMBULATORY_CARE_PROVIDER_SITE_OTHER): Payer: BLUE CROSS/BLUE SHIELD

## 2018-09-16 ENCOUNTER — Other Ambulatory Visit: Payer: Self-pay | Admitting: Family Medicine

## 2018-09-16 DIAGNOSIS — I255 Ischemic cardiomyopathy: Secondary | ICD-10-CM

## 2018-09-16 DIAGNOSIS — E1169 Type 2 diabetes mellitus with other specified complication: Secondary | ICD-10-CM | POA: Diagnosis not present

## 2018-09-16 LAB — CUP PACEART REMOTE DEVICE CHECK
Battery Voltage: 2.91 V
Brady Statistic RV Percent Paced: 0 %
Date Time Interrogation Session: 20200211093729
HighPow Impedance: 304 Ohm
HighPow Impedance: 32 Ohm
HighPow Impedance: 39 Ohm
Implantable Lead Implant Date: 20130603
Implantable Lead Location: 753860
Implantable Lead Model: 6947
Implantable Pulse Generator Implant Date: 20130603
Lead Channel Impedance Value: 342 Ohm
Lead Channel Pacing Threshold Pulse Width: 0.4 ms
Lead Channel Sensing Intrinsic Amplitude: 9.125 mV
Lead Channel Sensing Intrinsic Amplitude: 9.125 mV
Lead Channel Setting Pacing Amplitude: 2.5 V
Lead Channel Setting Pacing Pulse Width: 0.4 ms
Lead Channel Setting Sensing Sensitivity: 0.3 mV
MDC IDC MSMT LEADCHNL RV PACING THRESHOLD AMPLITUDE: 1 V

## 2018-09-22 DIAGNOSIS — B9561 Methicillin susceptible Staphylococcus aureus infection as the cause of diseases classified elsewhere: Secondary | ICD-10-CM | POA: Diagnosis not present

## 2018-09-22 DIAGNOSIS — E11621 Type 2 diabetes mellitus with foot ulcer: Secondary | ICD-10-CM | POA: Diagnosis not present

## 2018-09-22 DIAGNOSIS — L97526 Non-pressure chronic ulcer of other part of left foot with bone involvement without evidence of necrosis: Secondary | ICD-10-CM | POA: Diagnosis not present

## 2018-09-22 DIAGNOSIS — E1140 Type 2 diabetes mellitus with diabetic neuropathy, unspecified: Secondary | ICD-10-CM | POA: Diagnosis not present

## 2018-09-22 DIAGNOSIS — I110 Hypertensive heart disease with heart failure: Secondary | ICD-10-CM | POA: Diagnosis not present

## 2018-09-22 DIAGNOSIS — L97522 Non-pressure chronic ulcer of other part of left foot with fat layer exposed: Secondary | ICD-10-CM | POA: Diagnosis not present

## 2018-09-22 DIAGNOSIS — I2510 Atherosclerotic heart disease of native coronary artery without angina pectoris: Secondary | ICD-10-CM | POA: Diagnosis not present

## 2018-09-22 DIAGNOSIS — I509 Heart failure, unspecified: Secondary | ICD-10-CM | POA: Diagnosis not present

## 2018-09-22 DIAGNOSIS — T887XXA Unspecified adverse effect of drug or medicament, initial encounter: Secondary | ICD-10-CM | POA: Diagnosis not present
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
## 2018-09-24 ENCOUNTER — Ambulatory Visit (INDEPENDENT_AMBULATORY_CARE_PROVIDER_SITE_OTHER): Payer: BLUE CROSS/BLUE SHIELD | Admitting: Ophthalmology

## 2018-09-24 DIAGNOSIS — H4312 Vitreous hemorrhage, left eye: Secondary | ICD-10-CM | POA: Diagnosis not present

## 2018-09-24 DIAGNOSIS — H3581 Retinal edema: Secondary | ICD-10-CM

## 2018-09-24 DIAGNOSIS — E113411 Type 2 diabetes mellitus with severe nonproliferative diabetic retinopathy with macular edema, right eye: Secondary | ICD-10-CM

## 2018-09-24 DIAGNOSIS — H25813 Combined forms of age-related cataract, bilateral: Secondary | ICD-10-CM

## 2018-09-24 DIAGNOSIS — E113512 Type 2 diabetes mellitus with proliferative diabetic retinopathy with macular edema, left eye: Secondary | ICD-10-CM

## 2018-09-24 DIAGNOSIS — I10 Essential (primary) hypertension: Secondary | ICD-10-CM

## 2018-09-24 DIAGNOSIS — H35033 Hypertensive retinopathy, bilateral: Secondary | ICD-10-CM

## 2018-09-24 MED ORDER — BEVACIZUMAB CHEMO INJECTION 1.25MG/0.05ML SYRINGE FOR KALEIDOSCOPE
1.2500 mg | INTRAVITREAL | Status: DC
  Administered 2018-09-24: 1.25 mg via INTRAVITREAL

## 2018-09-25 ENCOUNTER — Ambulatory Visit: Payer: BLUE CROSS/BLUE SHIELD | Admitting: Infectious Diseases

## 2018-09-25 ENCOUNTER — Encounter (INDEPENDENT_AMBULATORY_CARE_PROVIDER_SITE_OTHER): Payer: Self-pay | Admitting: Ophthalmology

## 2018-09-26 ENCOUNTER — Encounter: Payer: Self-pay | Admitting: Infectious Diseases

## 2018-09-26 ENCOUNTER — Ambulatory Visit (INDEPENDENT_AMBULATORY_CARE_PROVIDER_SITE_OTHER): Payer: BLUE CROSS/BLUE SHIELD | Admitting: Infectious Diseases

## 2018-09-26 DIAGNOSIS — N183 Chronic kidney disease, stage 3 (moderate): Secondary | ICD-10-CM | POA: Diagnosis not present

## 2018-09-26 DIAGNOSIS — Z794 Long term (current) use of insulin: Secondary | ICD-10-CM

## 2018-09-26 DIAGNOSIS — M869 Osteomyelitis, unspecified: Secondary | ICD-10-CM | POA: Diagnosis not present

## 2018-09-26 DIAGNOSIS — I5042 Chronic combined systolic (congestive) and diastolic (congestive) heart failure: Secondary | ICD-10-CM | POA: Diagnosis not present

## 2018-09-26 DIAGNOSIS — E1122 Type 2 diabetes mellitus with diabetic chronic kidney disease: Secondary | ICD-10-CM

## 2018-09-26 MED ORDER — CEPHALEXIN 500 MG PO CAPS: 500.0000 mg | ORAL_CAPSULE | Freq: Two times a day (BID) | ORAL | 3 refills | Status: DC

## 2018-09-26 NOTE — Assessment & Plan Note (Signed)
Per pt his FSG are fairly well controlled. His prev A1C was good.

## 2018-09-26 NOTE — Assessment & Plan Note (Signed)
He appears to be doing well His wt is down.

## 2018-09-26 NOTE — Assessment & Plan Note (Signed)
He appears to be doing well, I believe his wound is smaller Despite this, his wound persists, will start him on po keflex.  Will see him back in 3 months Appreciate WOC f/u.

## 2018-09-26 NOTE — Progress Notes (Signed)
   Subjective:    Patient ID: Marcus Jewett., male    DOB: Jan 22, 1961, 58 y.o.   MRN: [id number]  HPI 58 y.o.malewith hx of DM and CABG, chronic wound of his L foot. He was seen by wound care center, was referred to ID for concern of bone infection in his foot. He had CT done 12-2 showing early findings of osteo at 5th MT base.He states he was previously given po anbx, can't remember names. He had superficial Cx 05-2018 and 07-2018 of his wound which both grew MSSA. He was then reported 30# wt gain over 2-3 weeks by CHF clinic. In ED12-27he was noted to have 43# wt gain. His WBC was normal and he was afebrile. He was seen in hospital and was started on ancef. He was d/c home on 1-2. His anbx end date is 09-15-18.  He is having pain in his foot. Was seen at [facility] this past week and had local debridement.  No f/c.  His FSG have been "pretty good". His A1C was 6.0 (08-05-18). FSG have been ~ 100-120.   His wt is down 10# from 09-11-18.   He was in hospital 1-29 for cardioversion. This was successful.  He has also had injections into his eyes this week.  His wt is down 10#. He feels like it has been pretty steady.   Review of Systems  Constitutional: Negative for appetite change, chills, fever and unexpected weight change.  Gastrointestinal: Negative for constipation and diarrhea.  Genitourinary: Negative for difficulty urinating.  Skin: Positive for wound.   Please see HPI. All other systems reviewed and negative.     Objective:   Physical Exam Constitutional:      Appearance: Normal appearance.  HENT:     Mouth/Throat:     Mouth: Mucous membranes are moist.     Pharynx: No oropharyngeal exudate.  Eyes:     Extraocular Movements: Extraocular movements intact.     Pupils: Pupils are equal, round, and reactive to light.  Neck:     Musculoskeletal: Normal range of motion.  Cardiovascular:     Rate and Rhythm: Normal rate and regular rhythm.  Pulmonary:     Effort:  Pulmonary effort is normal.     Breath sounds: Normal breath sounds.  Abdominal:     General: Bowel sounds are normal. There is no distension.     Palpations: Abdomen is soft.     Tenderness: There is no abdominal tenderness.  Musculoskeletal:     Right lower leg: No edema.     Left lower leg: No edema.  Neurological:     Mental Status: He is alert.  Psychiatric:        Mood and Affect: Mood normal.      No d/c expressible from wound. Non-tender. No increase in heat.        Assessment & Plan:

## 2018-09-29 DIAGNOSIS — I2510 Atherosclerotic heart disease of native coronary artery without angina pectoris: Secondary | ICD-10-CM | POA: Diagnosis not present

## 2018-09-29 DIAGNOSIS — I509 Heart failure, unspecified: Secondary | ICD-10-CM | POA: Diagnosis not present

## 2018-09-29 DIAGNOSIS — I110 Hypertensive heart disease with heart failure: Secondary | ICD-10-CM | POA: Diagnosis not present

## 2018-09-29 DIAGNOSIS — L97522 Non-pressure chronic ulcer of other part of left foot with fat layer exposed: Secondary | ICD-10-CM | POA: Diagnosis not present

## 2018-09-29 DIAGNOSIS — E1140 Type 2 diabetes mellitus with diabetic neuropathy, unspecified: Secondary | ICD-10-CM | POA: Diagnosis not present

## 2018-09-29 DIAGNOSIS — L97526 Non-pressure chronic ulcer of other part of left foot with bone involvement without evidence of necrosis: Secondary | ICD-10-CM | POA: Diagnosis not present

## 2018-09-29 DIAGNOSIS — B9561 Methicillin susceptible Staphylococcus aureus infection as the cause of diseases classified elsewhere: Secondary | ICD-10-CM | POA: Diagnosis not present

## 2018-09-29 DIAGNOSIS — E11621 Type 2 diabetes mellitus with foot ulcer: Secondary | ICD-10-CM | POA: Diagnosis not present

## 2018-09-29 NOTE — Progress Notes (Signed)
Remote ICD transmission.   

## 2018-10-06 ENCOUNTER — Encounter (HOSPITAL_BASED_OUTPATIENT_CLINIC_OR_DEPARTMENT_OTHER): Payer: BLUE CROSS/BLUE SHIELD | Attending: Internal Medicine

## 2018-10-06 DIAGNOSIS — Z7901 Long term (current) use of anticoagulants: Secondary | ICD-10-CM | POA: Insufficient documentation

## 2018-10-06 DIAGNOSIS — I2510 Atherosclerotic heart disease of native coronary artery without angina pectoris: Secondary | ICD-10-CM | POA: Diagnosis not present

## 2018-10-06 DIAGNOSIS — E11621 Type 2 diabetes mellitus with foot ulcer: Secondary | ICD-10-CM | POA: Insufficient documentation

## 2018-10-06 DIAGNOSIS — E1140 Type 2 diabetes mellitus with diabetic neuropathy, unspecified: Secondary | ICD-10-CM | POA: Insufficient documentation

## 2018-10-06 DIAGNOSIS — I110 Hypertensive heart disease with heart failure: Secondary | ICD-10-CM | POA: Insufficient documentation

## 2018-10-06 DIAGNOSIS — M86372 Chronic multifocal osteomyelitis, left ankle and foot: Secondary | ICD-10-CM | POA: Insufficient documentation

## 2018-10-06 DIAGNOSIS — I429 Cardiomyopathy, unspecified: Secondary | ICD-10-CM | POA: Diagnosis not present

## 2018-10-06 DIAGNOSIS — E1169 Type 2 diabetes mellitus with other specified complication: Secondary | ICD-10-CM | POA: Insufficient documentation

## 2018-10-06 DIAGNOSIS — I509 Heart failure, unspecified: Secondary | ICD-10-CM | POA: Diagnosis not present

## 2018-10-06 DIAGNOSIS — L97524 Non-pressure chronic ulcer of other part of left foot with necrosis of bone: Secondary | ICD-10-CM | POA: Diagnosis not present

## 2018-10-06 DIAGNOSIS — L89899 Pressure ulcer of other site, unspecified stage: Secondary | ICD-10-CM | POA: Diagnosis not present

## 2018-10-09 ENCOUNTER — Ambulatory Visit (INDEPENDENT_AMBULATORY_CARE_PROVIDER_SITE_OTHER): Payer: BLUE CROSS/BLUE SHIELD | Admitting: Ophthalmology

## 2018-10-09 ENCOUNTER — Other Ambulatory Visit: Payer: Self-pay | Admitting: Cardiology

## 2018-10-09 ENCOUNTER — Encounter (INDEPENDENT_AMBULATORY_CARE_PROVIDER_SITE_OTHER): Payer: Self-pay | Admitting: Ophthalmology

## 2018-10-09 ENCOUNTER — Encounter (INDEPENDENT_AMBULATORY_CARE_PROVIDER_SITE_OTHER): Payer: BLUE CROSS/BLUE SHIELD | Admitting: Ophthalmology

## 2018-10-09 DIAGNOSIS — E113512 Type 2 diabetes mellitus with proliferative diabetic retinopathy with macular edema, left eye: Secondary | ICD-10-CM | POA: Diagnosis not present

## 2018-10-09 DIAGNOSIS — E113411 Type 2 diabetes mellitus with severe nonproliferative diabetic retinopathy with macular edema, right eye: Secondary | ICD-10-CM

## 2018-10-09 DIAGNOSIS — H35033 Hypertensive retinopathy, bilateral: Secondary | ICD-10-CM

## 2018-10-09 DIAGNOSIS — H3581 Retinal edema: Secondary | ICD-10-CM

## 2018-10-09 DIAGNOSIS — H25813 Combined forms of age-related cataract, bilateral: Secondary | ICD-10-CM

## 2018-10-09 DIAGNOSIS — H4312 Vitreous hemorrhage, left eye: Secondary | ICD-10-CM | POA: Diagnosis not present

## 2018-10-09 DIAGNOSIS — I10 Essential (primary) hypertension: Secondary | ICD-10-CM
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
## 2018-10-10 ENCOUNTER — Telehealth: Payer: Self-pay | Admitting: *Deleted

## 2018-10-10 NOTE — Telephone Encounter (Signed)
Increase the Novolog to 10 units TID with meals

## 2018-10-10 NOTE — Telephone Encounter (Signed)
Copied from [place] [id number]. Topic: General - Other >> Oct 10, 2018  7:56 AM Carolyn Stare wrote:  Pt call to say that Dr Sarajane Jews took him off lantus solostar he blood sugar goes up. Pt said  (NOVOLOG FLEXPEN) 100 UNIT/ML FlexPen along is not doing the job

## 2018-10-10 NOTE — Telephone Encounter (Signed)
Called and spoke with pt and he is aware of novolog change to 10 units TID with meals per Dr. Sarajane Jews.

## 2018-10-10 NOTE — Telephone Encounter (Signed)
Dr. Fry please advise. Thanks  

## 2018-10-13 DIAGNOSIS — L89899 Pressure ulcer of other site, unspecified stage: Secondary | ICD-10-CM | POA: Diagnosis not present

## 2018-10-13 DIAGNOSIS — I110 Hypertensive heart disease with heart failure: Secondary | ICD-10-CM | POA: Diagnosis not present

## 2018-10-13 DIAGNOSIS — I429 Cardiomyopathy, unspecified: Secondary | ICD-10-CM | POA: Diagnosis not present

## 2018-10-13 DIAGNOSIS — I509 Heart failure, unspecified: Secondary | ICD-10-CM | POA: Diagnosis not present

## 2018-10-13 DIAGNOSIS — I2510 Atherosclerotic heart disease of native coronary artery without angina pectoris: Secondary | ICD-10-CM | POA: Diagnosis not present

## 2018-10-13 DIAGNOSIS — E11621 Type 2 diabetes mellitus with foot ulcer: Secondary | ICD-10-CM | POA: Diagnosis not present

## 2018-10-13 DIAGNOSIS — M86372 Chronic multifocal osteomyelitis, left ankle and foot: Secondary | ICD-10-CM | POA: Diagnosis not present

## 2018-10-13 DIAGNOSIS — E1140 Type 2 diabetes mellitus with diabetic neuropathy, unspecified: Secondary | ICD-10-CM | POA: Diagnosis not present

## 2018-10-13 DIAGNOSIS — Z7901 Long term (current) use of anticoagulants: Secondary | ICD-10-CM | POA: Diagnosis not present

## 2018-10-13 DIAGNOSIS — L97524 Non-pressure chronic ulcer of other part of left foot with necrosis of bone: Secondary | ICD-10-CM | POA: Diagnosis not present

## 2018-10-13 DIAGNOSIS — E1169 Type 2 diabetes mellitus with other specified complication: Secondary | ICD-10-CM | POA: Diagnosis not present
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
## 2018-10-23 ENCOUNTER — Other Ambulatory Visit: Payer: Self-pay

## 2018-10-23 ENCOUNTER — Encounter (INDEPENDENT_AMBULATORY_CARE_PROVIDER_SITE_OTHER): Payer: Self-pay | Admitting: Ophthalmology

## 2018-10-23 ENCOUNTER — Ambulatory Visit (INDEPENDENT_AMBULATORY_CARE_PROVIDER_SITE_OTHER): Payer: BLUE CROSS/BLUE SHIELD | Admitting: Ophthalmology

## 2018-10-23 DIAGNOSIS — I10 Essential (primary) hypertension: Secondary | ICD-10-CM

## 2018-10-23 DIAGNOSIS — H35033 Hypertensive retinopathy, bilateral: Secondary | ICD-10-CM

## 2018-10-23 DIAGNOSIS — H3581 Retinal edema: Secondary | ICD-10-CM

## 2018-10-23 DIAGNOSIS — H25813 Combined forms of age-related cataract, bilateral: Secondary | ICD-10-CM

## 2018-10-23 DIAGNOSIS — E113411 Type 2 diabetes mellitus with severe nonproliferative diabetic retinopathy with macular edema, right eye: Secondary | ICD-10-CM | POA: Diagnosis not present

## 2018-10-23 DIAGNOSIS — E113512 Type 2 diabetes mellitus with proliferative diabetic retinopathy with macular edema, left eye: Secondary | ICD-10-CM | POA: Diagnosis not present

## 2018-10-23 DIAGNOSIS — H4312 Vitreous hemorrhage, left eye: Secondary | ICD-10-CM

## 2018-10-23 MED ORDER — BEVACIZUMAB CHEMO INJECTION 1.25MG/0.05ML SYRINGE FOR KALEIDOSCOPE
1.2500 mg | INTRAVITREAL | Status: AC | PRN
  Administered 2018-10-23: 1.25 mg via INTRAVITREAL

## 2018-10-27 DIAGNOSIS — E11621 Type 2 diabetes mellitus with foot ulcer: Secondary | ICD-10-CM | POA: Diagnosis not present

## 2018-10-27 DIAGNOSIS — Z7901 Long term (current) use of anticoagulants: Secondary | ICD-10-CM | POA: Diagnosis not present

## 2018-10-27 DIAGNOSIS — I110 Hypertensive heart disease with heart failure: Secondary | ICD-10-CM | POA: Diagnosis not present

## 2018-10-27 DIAGNOSIS — E1169 Type 2 diabetes mellitus with other specified complication: Secondary | ICD-10-CM | POA: Diagnosis not present

## 2018-10-27 DIAGNOSIS — I429 Cardiomyopathy, unspecified: Secondary | ICD-10-CM | POA: Diagnosis not present

## 2018-10-27 DIAGNOSIS — M86372 Chronic multifocal osteomyelitis, left ankle and foot: Secondary | ICD-10-CM | POA: Diagnosis not present

## 2018-10-27 DIAGNOSIS — L97522 Non-pressure chronic ulcer of other part of left foot with fat layer exposed: Secondary | ICD-10-CM | POA: Diagnosis not present

## 2018-10-27 DIAGNOSIS — E1140 Type 2 diabetes mellitus with diabetic neuropathy, unspecified: Secondary | ICD-10-CM | POA: Diagnosis not present

## 2018-10-27 DIAGNOSIS — I2510 Atherosclerotic heart disease of native coronary artery without angina pectoris: Secondary | ICD-10-CM | POA: Diagnosis not present

## 2018-10-27 DIAGNOSIS — L97524 Non-pressure chronic ulcer of other part of left foot with necrosis of bone: Secondary | ICD-10-CM | POA: Diagnosis not present

## 2018-10-27 DIAGNOSIS — I509 Heart failure, unspecified: Secondary | ICD-10-CM | POA: Diagnosis not present
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
## 2018-11-03 DIAGNOSIS — I509 Heart failure, unspecified: Secondary | ICD-10-CM | POA: Diagnosis not present

## 2018-11-03 DIAGNOSIS — L97524 Non-pressure chronic ulcer of other part of left foot with necrosis of bone: Secondary | ICD-10-CM | POA: Diagnosis not present

## 2018-11-03 DIAGNOSIS — E1140 Type 2 diabetes mellitus with diabetic neuropathy, unspecified: Secondary | ICD-10-CM | POA: Diagnosis not present

## 2018-11-03 DIAGNOSIS — E11621 Type 2 diabetes mellitus with foot ulcer: Secondary | ICD-10-CM | POA: Diagnosis not present

## 2018-11-03 DIAGNOSIS — Z7901 Long term (current) use of anticoagulants: Secondary | ICD-10-CM | POA: Diagnosis not present

## 2018-11-03 DIAGNOSIS — L97522 Non-pressure chronic ulcer of other part of left foot with fat layer exposed: Secondary | ICD-10-CM | POA: Diagnosis not present

## 2018-11-03 DIAGNOSIS — I110 Hypertensive heart disease with heart failure: Secondary | ICD-10-CM | POA: Diagnosis not present

## 2018-11-03 DIAGNOSIS — I429 Cardiomyopathy, unspecified: Secondary | ICD-10-CM | POA: Diagnosis not present

## 2018-11-03 DIAGNOSIS — M86372 Chronic multifocal osteomyelitis, left ankle and foot: Secondary | ICD-10-CM | POA: Diagnosis not present

## 2018-11-03 DIAGNOSIS — I2510 Atherosclerotic heart disease of native coronary artery without angina pectoris: Secondary | ICD-10-CM | POA: Diagnosis not present

## 2018-11-03 DIAGNOSIS — E1169 Type 2 diabetes mellitus with other specified complication: Secondary | ICD-10-CM | POA: Diagnosis not present
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
## 2018-11-10 ENCOUNTER — Other Ambulatory Visit: Payer: Self-pay

## 2018-11-10 ENCOUNTER — Encounter (HOSPITAL_BASED_OUTPATIENT_CLINIC_OR_DEPARTMENT_OTHER): Payer: BLUE CROSS/BLUE SHIELD | Attending: Internal Medicine

## 2018-11-10 ENCOUNTER — Encounter (HOSPITAL_COMMUNITY): Payer: BLUE CROSS/BLUE SHIELD | Admitting: Cardiology

## 2018-11-10 DIAGNOSIS — E1140 Type 2 diabetes mellitus with diabetic neuropathy, unspecified: Secondary | ICD-10-CM | POA: Diagnosis not present

## 2018-11-10 DIAGNOSIS — E1169 Type 2 diabetes mellitus with other specified complication: Secondary | ICD-10-CM | POA: Insufficient documentation

## 2018-11-10 DIAGNOSIS — M8630 Chronic multifocal osteomyelitis, unspecified site: Secondary | ICD-10-CM | POA: Insufficient documentation

## 2018-11-10 DIAGNOSIS — I509 Heart failure, unspecified: Secondary | ICD-10-CM | POA: Diagnosis not present

## 2018-11-10 DIAGNOSIS — B957 Other staphylococcus as the cause of diseases classified elsewhere: Secondary | ICD-10-CM | POA: Diagnosis not present

## 2018-11-10 DIAGNOSIS — I110 Hypertensive heart disease with heart failure: Secondary | ICD-10-CM | POA: Diagnosis not present

## 2018-11-10 DIAGNOSIS — L97524 Non-pressure chronic ulcer of other part of left foot with necrosis of bone: Secondary | ICD-10-CM | POA: Insufficient documentation

## 2018-11-10 DIAGNOSIS — E11621 Type 2 diabetes mellitus with foot ulcer: Secondary | ICD-10-CM | POA: Diagnosis not present

## 2018-11-10 DIAGNOSIS — L97526 Non-pressure chronic ulcer of other part of left foot with bone involvement without evidence of necrosis: Secondary | ICD-10-CM | POA: Diagnosis not present

## 2018-11-11 ENCOUNTER — Encounter (INDEPENDENT_AMBULATORY_CARE_PROVIDER_SITE_OTHER): Payer: Self-pay | Admitting: Ophthalmology

## 2018-11-11 ENCOUNTER — Ambulatory Visit (INDEPENDENT_AMBULATORY_CARE_PROVIDER_SITE_OTHER): Payer: BLUE CROSS/BLUE SHIELD | Admitting: Ophthalmology

## 2018-11-11 DIAGNOSIS — H4312 Vitreous hemorrhage, left eye: Secondary | ICD-10-CM | POA: Diagnosis not present

## 2018-11-11 DIAGNOSIS — H35033 Hypertensive retinopathy, bilateral: Secondary | ICD-10-CM

## 2018-11-11 DIAGNOSIS — E113512 Type 2 diabetes mellitus with proliferative diabetic retinopathy with macular edema, left eye: Secondary | ICD-10-CM | POA: Diagnosis not present

## 2018-11-11 DIAGNOSIS — H25813 Combined forms of age-related cataract, bilateral: Secondary | ICD-10-CM

## 2018-11-11 DIAGNOSIS — H3581 Retinal edema: Secondary | ICD-10-CM | POA: Diagnosis not present

## 2018-11-11 DIAGNOSIS — E113411 Type 2 diabetes mellitus with severe nonproliferative diabetic retinopathy with macular edema, right eye: Secondary | ICD-10-CM

## 2018-11-11 DIAGNOSIS — E113311 Type 2 diabetes mellitus with moderate nonproliferative diabetic retinopathy with macular edema, right eye: Secondary | ICD-10-CM

## 2018-11-11 DIAGNOSIS — I10 Essential (primary) hypertension: Secondary | ICD-10-CM

## 2018-11-17 ENCOUNTER — Other Ambulatory Visit (HOSPITAL_COMMUNITY)
Admission: RE | Admit: 2018-11-17 | Discharge: 2018-11-17 | Disposition: A | Discharge status: Home / Self Care | Payer: BLUE CROSS/BLUE SHIELD | Source: Other Acute Inpatient Hospital | Attending: Internal Medicine | Admitting: Internal Medicine

## 2018-11-17 DIAGNOSIS — I509 Heart failure, unspecified: Secondary | ICD-10-CM | POA: Diagnosis not present

## 2018-11-17 DIAGNOSIS — E1140 Type 2 diabetes mellitus with diabetic neuropathy, unspecified: Secondary | ICD-10-CM | POA: Diagnosis not present

## 2018-11-17 DIAGNOSIS — M8630 Chronic multifocal osteomyelitis, unspecified site: Secondary | ICD-10-CM | POA: Diagnosis not present

## 2018-11-17 DIAGNOSIS — E11621 Type 2 diabetes mellitus with foot ulcer: Secondary | ICD-10-CM | POA: Diagnosis not present

## 2018-11-17 DIAGNOSIS — L97522 Non-pressure chronic ulcer of other part of left foot with fat layer exposed: Secondary | ICD-10-CM | POA: Diagnosis not present

## 2018-11-17 DIAGNOSIS — L97524 Non-pressure chronic ulcer of other part of left foot with necrosis of bone: Secondary | ICD-10-CM | POA: Diagnosis not present

## 2018-11-17 DIAGNOSIS — E1169 Type 2 diabetes mellitus with other specified complication: Secondary | ICD-10-CM | POA: Diagnosis not present

## 2018-11-17 DIAGNOSIS — B957 Other staphylococcus as the cause of diseases classified elsewhere: Secondary | ICD-10-CM | POA: Diagnosis not present

## 2018-11-17 DIAGNOSIS — I110 Hypertensive heart disease with heart failure: Secondary | ICD-10-CM | POA: Diagnosis not present

## 2018-11-20 LAB — AEROBIC CULTURE W GRAM STAIN (SUPERFICIAL SPECIMEN)

## 2018-11-20 LAB — AEROBIC CULTURE? (SUPERFICIAL SPECIMEN)
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
## 2018-11-24 DIAGNOSIS — E1169 Type 2 diabetes mellitus with other specified complication: Secondary | ICD-10-CM | POA: Diagnosis not present

## 2018-11-24 DIAGNOSIS — L97522 Non-pressure chronic ulcer of other part of left foot with fat layer exposed: Secondary | ICD-10-CM | POA: Diagnosis not present

## 2018-11-24 DIAGNOSIS — B957 Other staphylococcus as the cause of diseases classified elsewhere: Secondary | ICD-10-CM | POA: Diagnosis not present

## 2018-11-24 DIAGNOSIS — L97524 Non-pressure chronic ulcer of other part of left foot with necrosis of bone: Secondary | ICD-10-CM | POA: Diagnosis not present

## 2018-11-24 DIAGNOSIS — E1140 Type 2 diabetes mellitus with diabetic neuropathy, unspecified: Secondary | ICD-10-CM | POA: Diagnosis not present

## 2018-11-24 DIAGNOSIS — E11621 Type 2 diabetes mellitus with foot ulcer: Secondary | ICD-10-CM | POA: Diagnosis not present

## 2018-11-24 DIAGNOSIS — M8630 Chronic multifocal osteomyelitis, unspecified site: Secondary | ICD-10-CM | POA: Diagnosis not present

## 2018-11-24 DIAGNOSIS — I509 Heart failure, unspecified: Secondary | ICD-10-CM | POA: Diagnosis not present

## 2018-11-24 DIAGNOSIS — I110 Hypertensive heart disease with heart failure: Secondary | ICD-10-CM | POA: Diagnosis not present

## 2018-11-25 ENCOUNTER — Ambulatory Visit (INDEPENDENT_AMBULATORY_CARE_PROVIDER_SITE_OTHER): Payer: BLUE CROSS/BLUE SHIELD | Admitting: Ophthalmology

## 2018-11-25 ENCOUNTER — Encounter (INDEPENDENT_AMBULATORY_CARE_PROVIDER_SITE_OTHER): Payer: Self-pay | Admitting: Ophthalmology

## 2018-11-25 ENCOUNTER — Other Ambulatory Visit: Payer: Self-pay

## 2018-11-25 DIAGNOSIS — H4312 Vitreous hemorrhage, left eye: Secondary | ICD-10-CM | POA: Diagnosis not present

## 2018-11-25 DIAGNOSIS — H3581 Retinal edema: Secondary | ICD-10-CM

## 2018-11-25 DIAGNOSIS — E113512 Type 2 diabetes mellitus with proliferative diabetic retinopathy with macular edema, left eye: Secondary | ICD-10-CM | POA: Diagnosis not present

## 2018-11-25 DIAGNOSIS — H25813 Combined forms of age-related cataract, bilateral: Secondary | ICD-10-CM

## 2018-11-25 DIAGNOSIS — I10 Essential (primary) hypertension: Secondary | ICD-10-CM

## 2018-11-25 DIAGNOSIS — H35033 Hypertensive retinopathy, bilateral: Secondary | ICD-10-CM

## 2018-11-25 DIAGNOSIS — E113411 Type 2 diabetes mellitus with severe nonproliferative diabetic retinopathy with macular edema, right eye: Secondary | ICD-10-CM

## 2018-11-25 MED ORDER — BEVACIZUMAB CHEMO INJECTION 1.25MG/0.05ML SYRINGE FOR KALEIDOSCOPE
1.2500 mg | INTRAVITREAL | Status: AC | PRN
  Administered 2018-11-25: 1.25 mg via INTRAVITREAL

## 2018-12-01 DIAGNOSIS — L97522 Non-pressure chronic ulcer of other part of left foot with fat layer exposed: Secondary | ICD-10-CM | POA: Diagnosis not present

## 2018-12-01 DIAGNOSIS — I110 Hypertensive heart disease with heart failure: Secondary | ICD-10-CM | POA: Diagnosis not present

## 2018-12-01 DIAGNOSIS — E1140 Type 2 diabetes mellitus with diabetic neuropathy, unspecified: Secondary | ICD-10-CM | POA: Diagnosis not present

## 2018-12-01 DIAGNOSIS — L97524 Non-pressure chronic ulcer of other part of left foot with necrosis of bone: Secondary | ICD-10-CM | POA: Diagnosis not present

## 2018-12-01 DIAGNOSIS — E1169 Type 2 diabetes mellitus with other specified complication: Secondary | ICD-10-CM | POA: Diagnosis not present

## 2018-12-01 DIAGNOSIS — M8630 Chronic multifocal osteomyelitis, unspecified site: Secondary | ICD-10-CM | POA: Diagnosis not present

## 2018-12-01 DIAGNOSIS — I509 Heart failure, unspecified: Secondary | ICD-10-CM | POA: Diagnosis not present

## 2018-12-01 DIAGNOSIS — B957 Other staphylococcus as the cause of diseases classified elsewhere: Secondary | ICD-10-CM | POA: Diagnosis not present

## 2018-12-01 DIAGNOSIS — E11621 Type 2 diabetes mellitus with foot ulcer: Secondary | ICD-10-CM | POA: Diagnosis not present

## 2018-12-08 ENCOUNTER — Encounter (HOSPITAL_BASED_OUTPATIENT_CLINIC_OR_DEPARTMENT_OTHER): Payer: BLUE CROSS/BLUE SHIELD | Attending: Internal Medicine

## 2018-12-08 ENCOUNTER — Other Ambulatory Visit: Payer: Self-pay

## 2018-12-08 DIAGNOSIS — I2510 Atherosclerotic heart disease of native coronary artery without angina pectoris: Secondary | ICD-10-CM | POA: Insufficient documentation

## 2018-12-08 DIAGNOSIS — E1140 Type 2 diabetes mellitus with diabetic neuropathy, unspecified: Secondary | ICD-10-CM | POA: Diagnosis not present

## 2018-12-08 DIAGNOSIS — I110 Hypertensive heart disease with heart failure: Secondary | ICD-10-CM | POA: Diagnosis not present

## 2018-12-08 DIAGNOSIS — L97522 Non-pressure chronic ulcer of other part of left foot with fat layer exposed: Secondary | ICD-10-CM | POA: Diagnosis not present

## 2018-12-08 DIAGNOSIS — L97524 Non-pressure chronic ulcer of other part of left foot with necrosis of bone: Secondary | ICD-10-CM | POA: Diagnosis not present

## 2018-12-08 DIAGNOSIS — E11621 Type 2 diabetes mellitus with foot ulcer: Secondary | ICD-10-CM | POA: Insufficient documentation

## 2018-12-08 DIAGNOSIS — T887XXA Unspecified adverse effect of drug or medicament, initial encounter: Secondary | ICD-10-CM | POA: Diagnosis not present

## 2018-12-08 DIAGNOSIS — E1169 Type 2 diabetes mellitus with other specified complication: Secondary | ICD-10-CM | POA: Insufficient documentation

## 2018-12-08 DIAGNOSIS — M86372 Chronic multifocal osteomyelitis, left ankle and foot: Secondary | ICD-10-CM | POA: Insufficient documentation

## 2018-12-08 DIAGNOSIS — I5042 Chronic combined systolic (congestive) and diastolic (congestive) heart failure: Secondary | ICD-10-CM | POA: Diagnosis not present

## 2018-12-08 DIAGNOSIS — I87332 Chronic venous hypertension (idiopathic) with ulcer and inflammation of left lower extremity: Secondary | ICD-10-CM | POA: Insufficient documentation

## 2018-12-10 ENCOUNTER — Other Ambulatory Visit: Payer: Self-pay | Admitting: Family Medicine

## 2018-12-15 DIAGNOSIS — I2510 Atherosclerotic heart disease of native coronary artery without angina pectoris: Secondary | ICD-10-CM | POA: Diagnosis not present

## 2018-12-15 DIAGNOSIS — I87332 Chronic venous hypertension (idiopathic) with ulcer and inflammation of left lower extremity: Secondary | ICD-10-CM | POA: Diagnosis not present

## 2018-12-15 DIAGNOSIS — L97524 Non-pressure chronic ulcer of other part of left foot with necrosis of bone: Secondary | ICD-10-CM | POA: Diagnosis not present

## 2018-12-15 DIAGNOSIS — E11621 Type 2 diabetes mellitus with foot ulcer: Secondary | ICD-10-CM | POA: Diagnosis not present

## 2018-12-15 DIAGNOSIS — I110 Hypertensive heart disease with heart failure: Secondary | ICD-10-CM | POA: Diagnosis not present

## 2018-12-15 DIAGNOSIS — L97522 Non-pressure chronic ulcer of other part of left foot with fat layer exposed: Secondary | ICD-10-CM | POA: Diagnosis not present

## 2018-12-15 DIAGNOSIS — E1140 Type 2 diabetes mellitus with diabetic neuropathy, unspecified: Secondary | ICD-10-CM | POA: Diagnosis not present

## 2018-12-15 DIAGNOSIS — M86372 Chronic multifocal osteomyelitis, left ankle and foot: Secondary | ICD-10-CM | POA: Diagnosis not present

## 2018-12-15 DIAGNOSIS — I5042 Chronic combined systolic (congestive) and diastolic (congestive) heart failure: Secondary | ICD-10-CM | POA: Diagnosis not present

## 2018-12-15 DIAGNOSIS — E1169 Type 2 diabetes mellitus with other specified complication: Secondary | ICD-10-CM | POA: Diagnosis not present

## 2018-12-16 ENCOUNTER — Other Ambulatory Visit: Payer: Self-pay

## 2018-12-16 ENCOUNTER — Ambulatory Visit (INDEPENDENT_AMBULATORY_CARE_PROVIDER_SITE_OTHER): Payer: BLUE CROSS/BLUE SHIELD | Admitting: *Deleted

## 2018-12-16 DIAGNOSIS — I5022 Chronic systolic (congestive) heart failure: Secondary | ICD-10-CM

## 2018-12-16 DIAGNOSIS — I255 Ischemic cardiomyopathy: Secondary | ICD-10-CM

## 2018-12-17 LAB — CUP PACEART REMOTE DEVICE CHECK
Battery Voltage: 2.89 V
Brady Statistic RV Percent Paced: 0 %
Date Time Interrogation Session: 20200512083726
HighPow Impedance: 266 Ohm
HighPow Impedance: 33 Ohm
HighPow Impedance: 40 Ohm
Implantable Lead Implant Date: 20130603
Implantable Lead Location: 753860
Implantable Lead Model: 6947
Implantable Pulse Generator Implant Date: 20130603
Lead Channel Impedance Value: 323 Ohm
Lead Channel Pacing Threshold Amplitude: 1 V
Lead Channel Pacing Threshold Pulse Width: 0.4 ms
Lead Channel Sensing Intrinsic Amplitude: 11.75 mV
Lead Channel Sensing Intrinsic Amplitude: 11.75 mV
Lead Channel Setting Pacing Amplitude: 2.5 V
Lead Channel Setting Pacing Pulse Width: 0.4 ms
Lead Channel Setting Sensing Sensitivity: 0.3 mV

## 2018-12-22 DIAGNOSIS — I2510 Atherosclerotic heart disease of native coronary artery without angina pectoris: Secondary | ICD-10-CM | POA: Diagnosis not present

## 2018-12-22 DIAGNOSIS — E1169 Type 2 diabetes mellitus with other specified complication: Secondary | ICD-10-CM | POA: Diagnosis not present

## 2018-12-22 DIAGNOSIS — I110 Hypertensive heart disease with heart failure: Secondary | ICD-10-CM | POA: Diagnosis not present

## 2018-12-22 DIAGNOSIS — L97522 Non-pressure chronic ulcer of other part of left foot with fat layer exposed: Secondary | ICD-10-CM | POA: Diagnosis not present

## 2018-12-22 DIAGNOSIS — E1140 Type 2 diabetes mellitus with diabetic neuropathy, unspecified: Secondary | ICD-10-CM | POA: Diagnosis not present

## 2018-12-22 DIAGNOSIS — M86372 Chronic multifocal osteomyelitis, left ankle and foot: Secondary | ICD-10-CM | POA: Diagnosis not present

## 2018-12-22 DIAGNOSIS — L97524 Non-pressure chronic ulcer of other part of left foot with necrosis of bone: Secondary | ICD-10-CM | POA: Diagnosis not present

## 2018-12-22 DIAGNOSIS — I87332 Chronic venous hypertension (idiopathic) with ulcer and inflammation of left lower extremity: Secondary | ICD-10-CM | POA: Diagnosis not present

## 2018-12-22 DIAGNOSIS — E11621 Type 2 diabetes mellitus with foot ulcer: Secondary | ICD-10-CM | POA: Diagnosis not present

## 2018-12-22 DIAGNOSIS — T887XXA Unspecified adverse effect of drug or medicament, initial encounter: Secondary | ICD-10-CM | POA: Diagnosis not present

## 2018-12-22 DIAGNOSIS — I5042 Chronic combined systolic (congestive) and diastolic (congestive) heart failure: Secondary | ICD-10-CM | POA: Diagnosis not present
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
## 2018-12-23 ENCOUNTER — Encounter (INDEPENDENT_AMBULATORY_CARE_PROVIDER_SITE_OTHER): Payer: Self-pay | Admitting: Ophthalmology

## 2018-12-23 ENCOUNTER — Other Ambulatory Visit: Payer: Self-pay

## 2018-12-23 ENCOUNTER — Ambulatory Visit (INDEPENDENT_AMBULATORY_CARE_PROVIDER_SITE_OTHER): Payer: BLUE CROSS/BLUE SHIELD | Admitting: Ophthalmology

## 2018-12-23 DIAGNOSIS — I10 Essential (primary) hypertension: Secondary | ICD-10-CM

## 2018-12-23 DIAGNOSIS — E113411 Type 2 diabetes mellitus with severe nonproliferative diabetic retinopathy with macular edema, right eye: Secondary | ICD-10-CM | POA: Diagnosis not present

## 2018-12-23 DIAGNOSIS — H35033 Hypertensive retinopathy, bilateral: Secondary | ICD-10-CM

## 2018-12-23 DIAGNOSIS — H25813 Combined forms of age-related cataract, bilateral: Secondary | ICD-10-CM

## 2018-12-23 DIAGNOSIS — H3581 Retinal edema: Secondary | ICD-10-CM | POA: Diagnosis not present

## 2018-12-23 DIAGNOSIS — H4312 Vitreous hemorrhage, left eye: Secondary | ICD-10-CM

## 2018-12-23 DIAGNOSIS — E113512 Type 2 diabetes mellitus with proliferative diabetic retinopathy with macular edema, left eye: Secondary | ICD-10-CM

## 2018-12-23 MED ORDER — BEVACIZUMAB CHEMO INJECTION 1.25MG/0.05ML SYRINGE FOR KALEIDOSCOPE
1.2500 mg | INTRAVITREAL | Status: AC | PRN
  Administered 2018-12-23: 1.25 mg via INTRAVITREAL

## 2018-12-25 ENCOUNTER — Ambulatory Visit: Payer: BLUE CROSS/BLUE SHIELD | Admitting: Infectious Diseases

## 2018-12-30 DIAGNOSIS — L97524 Non-pressure chronic ulcer of other part of left foot with necrosis of bone: Secondary | ICD-10-CM | POA: Diagnosis not present

## 2018-12-30 DIAGNOSIS — I87332 Chronic venous hypertension (idiopathic) with ulcer and inflammation of left lower extremity: Secondary | ICD-10-CM | POA: Diagnosis not present

## 2018-12-30 DIAGNOSIS — I5042 Chronic combined systolic (congestive) and diastolic (congestive) heart failure: Secondary | ICD-10-CM | POA: Diagnosis not present

## 2018-12-30 DIAGNOSIS — M86372 Chronic multifocal osteomyelitis, left ankle and foot: Secondary | ICD-10-CM | POA: Diagnosis not present

## 2018-12-30 DIAGNOSIS — I110 Hypertensive heart disease with heart failure: Secondary | ICD-10-CM | POA: Diagnosis not present

## 2018-12-30 DIAGNOSIS — E11621 Type 2 diabetes mellitus with foot ulcer: Secondary | ICD-10-CM | POA: Diagnosis not present

## 2018-12-30 DIAGNOSIS — L97522 Non-pressure chronic ulcer of other part of left foot with fat layer exposed: Secondary | ICD-10-CM | POA: Diagnosis not present

## 2018-12-30 DIAGNOSIS — E1140 Type 2 diabetes mellitus with diabetic neuropathy, unspecified: Secondary | ICD-10-CM | POA: Diagnosis not present

## 2018-12-30 DIAGNOSIS — E1169 Type 2 diabetes mellitus with other specified complication: Secondary | ICD-10-CM | POA: Diagnosis not present

## 2018-12-30 DIAGNOSIS — I2510 Atherosclerotic heart disease of native coronary artery without angina pectoris: Secondary | ICD-10-CM | POA: Diagnosis not present

## 2018-12-31 ENCOUNTER — Telehealth: Payer: Self-pay | Admitting: Family Medicine

## 2018-12-31 NOTE — Telephone Encounter (Signed)
Dr Fry please advise. thanks 

## 2018-12-31 NOTE — Telephone Encounter (Signed)
Copied from [place] [id number]. Topic: Quick Communication - See Telephone Encounter >> Dec 31, 2018 11:30 AM Ivar Drape wrote: CRM for notification. See Telephone encounter for: 12/31/18. Patient stated that since he has started taking Novalog his blood sugar readings are higher in the 400's. He has increased the amount he was taking but that is still not working. Please advise.

## 2019-01-01 NOTE — Telephone Encounter (Signed)
He needs to see a specialist about the diabetes. Make him a Doxy visit with me ASAP and I will refer him to Endocrine

## 2019-01-01 NOTE — Telephone Encounter (Signed)
Called and spoke with pt and he is scheduled to see Dr. Sarajane Jews tomorrow via doxy at 9

## 2019-01-02 ENCOUNTER — Ambulatory Visit (INDEPENDENT_AMBULATORY_CARE_PROVIDER_SITE_OTHER): Payer: BLUE CROSS/BLUE SHIELD | Admitting: Family Medicine

## 2019-01-02 ENCOUNTER — Other Ambulatory Visit: Payer: Self-pay

## 2019-01-02 ENCOUNTER — Encounter: Payer: Self-pay | Admitting: Family Medicine

## 2019-01-02 DIAGNOSIS — E1122 Type 2 diabetes mellitus with diabetic chronic kidney disease: Secondary | ICD-10-CM | POA: Diagnosis not present

## 2019-01-02 DIAGNOSIS — Z794 Long term (current) use of insulin: Secondary | ICD-10-CM | POA: Diagnosis not present

## 2019-01-02 DIAGNOSIS — N183 Chronic kidney disease, stage 3 (moderate): Secondary | ICD-10-CM

## 2019-01-02 MED ORDER — INSULIN ASPART 100 UNIT/ML FLEXPEN: PEN_INJECTOR | SUBCUTANEOUS | 11 refills | Status: DC

## 2019-01-02 MED ORDER — INSULIN GLARGINE 100 UNIT/ML SOLOSTAR PEN: 50.0000 [IU] | PEN_INJECTOR | Freq: Every day | SUBCUTANEOUS | 5 refills | Status: DC

## 2019-01-02 NOTE — Progress Notes (Signed)
Remote ICD transmission.   

## 2019-01-02 NOTE — Progress Notes (Signed)
   Subjective:    Patient ID: Marcus Jewett., male    DOB: 08/19/1960, 58 y.o.   MRN: [id number]  HPI Virtual Visit via Telephone Note  I connected with the patient on 01/02/19 at  9:00 AM EDT by telephone and verified that I am speaking with the correct person using two identifiers. We attempted to connect virtually but we had technical difficulties with the audio and video.    I discussed the limitations, risks, security and privacy concerns of performing an evaluation and management service by telephone and the availability of in person appointments. I also discussed with the patient that there may be a patient responsible charge related to this service. The patient expressed understanding and agreed to proceed.  Location patient: home Location provider: work or home office Participants present for the call: patient, provider Patient did not have a visit in the prior 7 days to address this/these issue(s).   History of Present Illness: Here to discuss his diabetes. Prior to being in the hospital in January he had been taking Lantus 60 units at bedtime and Novolog 5 units TID before meals. His glucoses had been fairly well controlled, though he would get low readings in the 60s at times. When he was [place] home however the Lantus was stopped. Since then his glucoses have been running high, and he has gradually increased the Novolog to 40 or 50 units BID. This gets his glucoses down fairly well for a short time, but then they jump back up to the 400s. He asks if he should start back on Lantus again. He feels well.    Observations/Objective: Patient sounds cheerful and well on the phone. I do not appreciate any SOB. Speech and thought processing are grossly intact. Patient reported vitals:  Assessment and Plan: Poorly controlled diabetes. I agree that he should start back on Lantus, and he will start off taking 50 units qhs. He will decrease the Novolog to 5 units BID before meals.  Recheck in 2 weeks with an in person OV to adjust the insulin and to get labs.  Alysia Penna, MD  Follow Up Instructions:     [phone] 5-10 [phone] 11-20 [phone] 21-30 I did not refer this patient for an OV in the next 24 hours for this/these issue(s).  I discussed the assessment and treatment plan with the patient. The patient was provided an opportunity to ask questions and all were answered. The patient agreed with the plan and demonstrated an understanding of the instructions.   The patient was advised to call back or seek an in-person evaluation if the symptoms worsen or if the condition fails to improve as anticipated.  I provided 18 minutes of non-face-to-face time during this encounter.   Alysia Penna, MD    Review of Systems     Objective:   Physical Exam        Assessment & Plan:

## 2019-01-05 ENCOUNTER — Other Ambulatory Visit: Payer: Self-pay

## 2019-01-05 ENCOUNTER — Encounter (HOSPITAL_BASED_OUTPATIENT_CLINIC_OR_DEPARTMENT_OTHER): Payer: BLUE CROSS/BLUE SHIELD | Attending: Internal Medicine

## 2019-01-05 DIAGNOSIS — E11621 Type 2 diabetes mellitus with foot ulcer: Secondary | ICD-10-CM | POA: Diagnosis not present

## 2019-01-05 DIAGNOSIS — E1140 Type 2 diabetes mellitus with diabetic neuropathy, unspecified: Secondary | ICD-10-CM | POA: Insufficient documentation

## 2019-01-05 DIAGNOSIS — I509 Heart failure, unspecified: Secondary | ICD-10-CM | POA: Insufficient documentation

## 2019-01-05 DIAGNOSIS — L97522 Non-pressure chronic ulcer of other part of left foot with fat layer exposed: Secondary | ICD-10-CM | POA: Diagnosis not present

## 2019-01-05 DIAGNOSIS — I110 Hypertensive heart disease with heart failure: Secondary | ICD-10-CM | POA: Insufficient documentation

## 2019-01-05 DIAGNOSIS — I2510 Atherosclerotic heart disease of native coronary artery without angina pectoris: Secondary | ICD-10-CM | POA: Insufficient documentation

## 2019-01-07 ENCOUNTER — Other Ambulatory Visit (HOSPITAL_COMMUNITY): Payer: Self-pay | Admitting: Student

## 2019-01-12 DIAGNOSIS — E11621 Type 2 diabetes mellitus with foot ulcer: Secondary | ICD-10-CM | POA: Diagnosis not present

## 2019-01-12 DIAGNOSIS — L97522 Non-pressure chronic ulcer of other part of left foot with fat layer exposed: Secondary | ICD-10-CM | POA: Diagnosis not present

## 2019-01-16 ENCOUNTER — Encounter: Payer: Self-pay | Admitting: Family Medicine

## 2019-01-16 ENCOUNTER — Other Ambulatory Visit: Payer: Self-pay

## 2019-01-16 ENCOUNTER — Ambulatory Visit (INDEPENDENT_AMBULATORY_CARE_PROVIDER_SITE_OTHER): Payer: BLUE CROSS/BLUE SHIELD | Admitting: Family Medicine

## 2019-01-16 ENCOUNTER — Telehealth: Payer: Self-pay | Admitting: Family Medicine

## 2019-01-16 VITALS — BP 110/64 | HR 78 | Temp 98.1°F | Wt 204.5 lb

## 2019-01-16 DIAGNOSIS — E1122 Type 2 diabetes mellitus with diabetic chronic kidney disease: Secondary | ICD-10-CM | POA: Diagnosis not present

## 2019-01-16 DIAGNOSIS — I5022 Chronic systolic (congestive) heart failure: Secondary | ICD-10-CM

## 2019-01-16 DIAGNOSIS — N183 Chronic kidney disease, stage 3 unspecified: Secondary | ICD-10-CM

## 2019-01-16 DIAGNOSIS — Z794 Long term (current) use of insulin: Secondary | ICD-10-CM | POA: Diagnosis not present

## 2019-01-16 DIAGNOSIS — E785 Hyperlipidemia, unspecified: Secondary | ICD-10-CM

## 2019-01-16 DIAGNOSIS — I10 Essential (primary) hypertension: Secondary | ICD-10-CM

## 2019-01-16 LAB — BASIC METABOLIC PANEL
BUN: 85 mg/dL (ref 6–23)
CO2: 27 mEq/L (ref 19–32)
Calcium: 10.6 mg/dL — ABNORMAL HIGH (ref 8.4–10.5)
Chloride: 96 mEq/L (ref 96–112)
Creatinine, Ser: 2.05 mg/dL — ABNORMAL HIGH (ref 0.40–1.50)
GFR: 40.53 mL/min — ABNORMAL LOW (ref >60.00)
Glucose, Bld: 209 mg/dL — ABNORMAL HIGH (ref 70–99)
Potassium: 4.3 mEq/L (ref 3.5–5.1)
Sodium: 137 mEq/L (ref 135–145)

## 2019-01-16 LAB — CBC WITH DIFFERENTIAL/PLATELET
Basophils Absolute: 0.1 10*3/uL (ref 0.0–0.1)
Basophils Relative: 0.7 % (ref 0.0–3.0)
Eosinophils Absolute: 0.1 10*3/uL (ref 0.0–0.7)
Eosinophils Relative: 1.4 % (ref 0.0–5.0)
HCT: 40.9 % (ref 39.0–52.0)
Hemoglobin: 13.5 g/dL (ref 13.0–17.0)
Lymphocytes Relative: 28.8 % (ref 12.0–46.0)
Lymphs Abs: 2.2 10*3/uL (ref 0.7–4.0)
MCHC: 33 g/dL (ref 30.0–36.0)
MCV: 86.9 fl (ref 78.0–100.0)
Monocytes Absolute: 0.6 10*3/uL (ref 0.1–1.0)
Monocytes Relative: 8.3 % (ref 3.0–12.0)
Neutro Abs: 4.7 10*3/uL (ref 1.4–7.7)
Neutrophils Relative %: 60.8 % (ref 43.0–77.0)
Platelets: 192 10*3/uL (ref 150.0–400.0)
RBC: 4.71 Mil/uL (ref 4.22–5.81)
RDW: 14.7 % (ref 11.5–15.5)
WBC: 7.8 10*3/uL (ref 4.0–10.5)

## 2019-01-16 LAB — LIPID PANEL
Cholesterol: 186 mg/dL (ref 0–200)
HDL: 31.1 mg/dL — ABNORMAL LOW (ref >39.00)
NonHDL: 154.41
Total CHOL/HDL Ratio: 6
Triglycerides: 266 mg/dL — ABNORMAL HIGH (ref 0.0–149.0)
VLDL: 53.2 mg/dL — ABNORMAL HIGH (ref 0.0–40.0)

## 2019-01-16 LAB — HEPATIC FUNCTION PANEL
ALT: 20 U/L (ref 0–53)
AST: 21 U/L (ref 0–37)
Albumin: 4.6 g/dL (ref 3.5–5.2)
Alkaline Phosphatase: 92 U/L (ref 39–117)
Bilirubin, Direct: 0.2 mg/dL (ref 0.0–0.3)
Total Bilirubin: 1 mg/dL (ref 0.2–1.2)
Total Protein: 8.2 g/dL (ref 6.0–8.3)

## 2019-01-16 LAB — TSH: TSH: 1.85 u[IU]/mL (ref 0.35–4.50)

## 2019-01-16 LAB — LDL CHOLESTEROL, DIRECT: Direct LDL: 98 mg/dL

## 2019-01-16 LAB — HEMOGLOBIN A1C: Hgb A1c MFr Bld: 11.8 % — ABNORMAL HIGH (ref 4.6–6.5)

## 2019-01-16 MED ORDER — NOVOLOG FLEXPEN 100 UNIT/ML ~~LOC~~ SOPN: PEN_INJECTOR | SUBCUTANEOUS | 11 refills | Status: DC

## 2019-01-16 MED ORDER — LANTUS SOLOSTAR 100 UNIT/ML ~~LOC~~ SOPN: 60.0000 [IU] | PEN_INJECTOR | Freq: Every day | SUBCUTANEOUS | 5 refills | Status: DC

## 2019-01-16 NOTE — Telephone Encounter (Signed)
Notified LaWana at [facility] of value.

## 2019-01-16 NOTE — Progress Notes (Signed)
   Subjective:    Patient ID: Marcus Jewett., male    DOB: 1960-10-11, 58 y.o.   MRN: [id number]  HPI Here to follow up on several issues. He feels great and has no complaints. His ankle swelling is well controlled, no SOB. His BP is stable. At our virtual visit a few weeks ago we started him back on Lantus and he has been taking 50 units qhs. He also takes Novolog 5 units before meals. His glucoses have come down from the 300s and 400s to between 180 and 260 now.    Review of Systems  Constitutional: Negative.   Respiratory: Negative.   Cardiovascular: Negative.   Neurological: Negative.        Objective:   Physical Exam Constitutional:      Appearance: Normal appearance.  Cardiovascular:     Rate and Rhythm: Normal rate and regular rhythm.     Pulses: Normal pulses.     Heart sounds: Normal heart sounds.  Pulmonary:     Effort: Pulmonary effort is normal.     Breath sounds: Normal breath sounds.  Musculoskeletal:     Right lower leg: No edema.     Left lower leg: No edema.  Neurological:     General: No focal deficit present.     Mental Status: He is alert and oriented to person, place, and time.           Assessment & Plan:  His CHF and HTN are stable. His diabetes is slowly coming under control. We will increase the Lantus to 60 units qhs. He is fasting today so we will check lipids, an A1c, etc.  Alysia Penna, MD

## 2019-01-16 NOTE — Telephone Encounter (Signed)
Will make Dr. Sarajane Jews aware.

## 2019-01-16 NOTE — Telephone Encounter (Signed)
Festus Holts, Medical Laboratory Technician from [facility] lab called to report pt's BUN 85 from sample collected 01/16/2019 at 0859; value repeated for accuracy; he verbalized correctness; will contact office for notification.

## 2019-01-16 NOTE — Addendum Note (Signed)
Addended by: Alysia Penna A on: 01/16/2019 03:40 PM   Modules accepted: Orders

## 2019-01-19 DIAGNOSIS — L97521 Non-pressure chronic ulcer of other part of left foot limited to breakdown of skin: Secondary | ICD-10-CM | POA: Diagnosis not present

## 2019-01-19 DIAGNOSIS — E11621 Type 2 diabetes mellitus with foot ulcer: Secondary | ICD-10-CM | POA: Diagnosis not present

## 2019-01-19 NOTE — Telephone Encounter (Signed)
I am aware and I have referred him to Nephrology

## 2019-01-27 ENCOUNTER — Encounter (INDEPENDENT_AMBULATORY_CARE_PROVIDER_SITE_OTHER): Payer: BLUE CROSS/BLUE SHIELD | Admitting: Ophthalmology

## 2019-01-30 ENCOUNTER — Other Ambulatory Visit: Payer: Self-pay

## 2019-01-30 ENCOUNTER — Encounter (HOSPITAL_COMMUNITY): Payer: Self-pay | Admitting: Cardiology

## 2019-01-30 ENCOUNTER — Encounter (HOSPITAL_COMMUNITY): Payer: BLUE CROSS/BLUE SHIELD | Admitting: Cardiology

## 2019-01-30 ENCOUNTER — Ambulatory Visit (HOSPITAL_COMMUNITY)
Admission: RE | Admit: 2019-01-30 | Discharge: 2019-01-30 | Disposition: A | Discharge status: Home / Self Care | Payer: Self-pay | Source: Ambulatory Visit | Attending: Cardiology | Admitting: Cardiology

## 2019-01-30 VITALS — BP 130/74 | HR 71 | Wt 204.2 lb

## 2019-01-30 DIAGNOSIS — I255 Ischemic cardiomyopathy: Secondary | ICD-10-CM | POA: Insufficient documentation

## 2019-01-30 DIAGNOSIS — I4892 Unspecified atrial flutter: Secondary | ICD-10-CM | POA: Insufficient documentation

## 2019-01-30 DIAGNOSIS — I2510 Atherosclerotic heart disease of native coronary artery without angina pectoris: Secondary | ICD-10-CM | POA: Insufficient documentation

## 2019-01-30 DIAGNOSIS — Z794 Long term (current) use of insulin: Secondary | ICD-10-CM | POA: Insufficient documentation

## 2019-01-30 DIAGNOSIS — Z95810 Presence of automatic (implantable) cardiac defibrillator: Secondary | ICD-10-CM | POA: Insufficient documentation

## 2019-01-30 DIAGNOSIS — B9561 Methicillin susceptible Staphylococcus aureus infection as the cause of diseases classified elsewhere: Secondary | ICD-10-CM | POA: Insufficient documentation

## 2019-01-30 DIAGNOSIS — I252 Old myocardial infarction: Secondary | ICD-10-CM | POA: Insufficient documentation

## 2019-01-30 DIAGNOSIS — M109 Gout, unspecified: Secondary | ICD-10-CM | POA: Insufficient documentation

## 2019-01-30 DIAGNOSIS — I5042 Chronic combined systolic (congestive) and diastolic (congestive) heart failure: Secondary | ICD-10-CM

## 2019-01-30 DIAGNOSIS — I480 Paroxysmal atrial fibrillation: Secondary | ICD-10-CM

## 2019-01-30 DIAGNOSIS — N183 Chronic kidney disease, stage 3 unspecified: Secondary | ICD-10-CM

## 2019-01-30 DIAGNOSIS — Z7901 Long term (current) use of anticoagulants: Secondary | ICD-10-CM | POA: Insufficient documentation

## 2019-01-30 DIAGNOSIS — Z951 Presence of aortocoronary bypass graft: Secondary | ICD-10-CM | POA: Insufficient documentation

## 2019-01-30 DIAGNOSIS — M869 Osteomyelitis, unspecified: Secondary | ICD-10-CM | POA: Insufficient documentation

## 2019-01-30 DIAGNOSIS — Z79899 Other long term (current) drug therapy: Secondary | ICD-10-CM | POA: Insufficient documentation

## 2019-01-30 DIAGNOSIS — Z833 Family history of diabetes mellitus: Secondary | ICD-10-CM | POA: Insufficient documentation

## 2019-01-30 DIAGNOSIS — E1140 Type 2 diabetes mellitus with diabetic neuropathy, unspecified: Secondary | ICD-10-CM | POA: Insufficient documentation

## 2019-01-30 DIAGNOSIS — E1169 Type 2 diabetes mellitus with other specified complication: Secondary | ICD-10-CM | POA: Insufficient documentation

## 2019-01-30 DIAGNOSIS — Z8249 Family history of ischemic heart disease and other diseases of the circulatory system: Secondary | ICD-10-CM | POA: Insufficient documentation

## 2019-01-30 DIAGNOSIS — I130 Hypertensive heart and chronic kidney disease with heart failure and stage 1 through stage 4 chronic kidney disease, or unspecified chronic kidney disease: Secondary | ICD-10-CM | POA: Insufficient documentation

## 2019-01-30 DIAGNOSIS — I5022 Chronic systolic (congestive) heart failure: Secondary | ICD-10-CM | POA: Insufficient documentation

## 2019-01-30 DIAGNOSIS — E1122 Type 2 diabetes mellitus with diabetic chronic kidney disease: Secondary | ICD-10-CM | POA: Insufficient documentation

## 2019-01-30 LAB — BASIC METABOLIC PANEL
Anion gap: 13 (ref 5–15)
BUN: 108 mg/dL — ABNORMAL HIGH (ref 6–20)
CO2: 25 mmol/L (ref 22–32)
Calcium: 10.6 mg/dL — ABNORMAL HIGH (ref 8.9–10.3)
Chloride: 97 mmol/L — ABNORMAL LOW (ref 98–111)
Creatinine, Ser: 2.29 mg/dL — ABNORMAL HIGH (ref 0.61–1.24)
GFR calc Af Amer: 35 mL/min — ABNORMAL LOW (ref >60)
GFR calc non Af Amer: 30 mL/min — ABNORMAL LOW (ref >60)
Glucose, Bld: 283 mg/dL — ABNORMAL HIGH (ref 70–99)
Potassium: 4.1 mmol/L (ref 3.5–5.1)
Sodium: 135 mmol/L (ref 135–145)

## 2019-01-30 LAB — DIGOXIN LEVEL: Digoxin Level: 0.3 ng/mL — ABNORMAL LOW (ref 0.8–2.0)

## 2019-01-30 MED ORDER — TORSEMIDE 20 MG PO TABS: ORAL_TABLET | ORAL | 6 refills | Status: DC

## 2019-01-30 MED ORDER — EZETIMIBE 10 MG PO TABS: 10.0000 mg | ORAL_TABLET | Freq: Every day | ORAL | 3 refills | Status: DC

## 2019-01-30 NOTE — Patient Instructions (Addendum)
STOP Metolazone  DECREASE Torsemide to 80mg  (4 tabs) in the morning AND 60mg  (3 tabs) in the evening.   START Zetia 10mg  (1 tab) daily  Labs today We will only contact you if something comes back abnormal or we need to make some changes. Otherwise no news is good news!  REPEAT labs in 10 days and 2 months.   You have been referred to [facility] for treatment of your chronic kidney disease. Their office will call you to schedule an appointment.   Your physician has recommended that you have a cardiopulmonary stress test (CPX). CPX testing is a non-invasive measurement of heart and lung function. It replaces a traditional treadmill stress test. This type of test provides a tremendous amount of information that relates not only to your present condition but also for future outcomes. This test combines measurements of you ventilation, respiratory gas exchange in the lungs, electrocardiogram (EKG), blood pressure and physical response before, during, and following an exercise protocol.  You will get a call to schedule this test once this opens up.  Your physician recommends that you schedule a follow-up appointment in: 3 weeks with Nurse Practitioner Amy   At the [facility], you and your health needs are our priority. As part of our continuing mission to provide you with exceptional heart care, we have created designated Provider Care Teams. These Care Teams include your primary Cardiologist (physician) and Advanced Practice Providers (APPs- Physician Assistants and Nurse Practitioners) who all work together to provide you with the care you need, when you need it.   You may see any of the following providers on your designated Care Team at your next follow up: Marland Kitchen Dr Glori Bickers . Dr Loralie Champagne . Darrick Grinder, NP

## 2019-01-31 ENCOUNTER — Other Ambulatory Visit (HOSPITAL_COMMUNITY): Payer: Self-pay | Admitting: Cardiology

## 2019-02-01 NOTE — Progress Notes (Signed)
Patient ID: Marcus Plemmons., male   DOB: [birth year], 58 y.o.   MRN: [id number] PCP: Dr. Sarajane Jews Cardiology: Dr. Aundra Dubin  HPI: [name] is a 58 y.o. male with h/o DM2, CAD s/p CABG 8366, systolic HF due to ischemic cardiomyopathy with EF 20-25% (echo 12/15), DM2 and CKD. He is s/p Medtronic single chamber ICD.  Admitted in 12/15 due to ADHF. Required short course milrinone for diuresis. Diuresed 30 pounds.   CPX 2/16 showed severely reduced functional capacity.  There was a significant disconnect between his symptoms and the CPX.  RHC in 6/16 showed fairly normal filling pressures and low but not markedly low cardiac output.   CPX (4/19) was submaximal but suggested severe limitation from HF.   He was admitted in 6/19 with marked volume overload and dyspnea.  Echo in 6/19 showed EF 20% with severe global hypokinesis.  LHC/RHC showed severe native vessel CAD with patent LIMA-D, SVG-OM, and SVG-PLV; cardiac index low.  He was started on milrinone and diuresed.  We discussed LVAD extensively in light of low output, creatinine that is trending up and concern for decompensation of RV over time. He was adamant that he did not want to pursue LVAD workup yet and did not want a referral for transplant evaluation. Milrinone was weaned off and he was discharged home.   Admitted 08/01/18-08/07/18 with volume overload and left foot wound. Diuresed 38 lbs with IV lasix and then transitioned back to torsemide 100 mg BID. ID was consulted with concerns for left foot osteomyelitis noted on CT scan. He got IV ancef q8 hours x 42 days. [facility] provided teaching and supplies for home antibiotics. Course complicated by AKI. DC weight 200 lbs.   He went into atrial flutter and had DCCV to NSR in 1/20.   Patient returns for followup of CHF and CAD.  His left foot osteomyelitis appears to have healed at this point and he is off antibiotics.  HgbA1c was 11.8 when last checked, PCP is managing and has restarted his Lantus.   No palpitations, he is in NSR today.  No chest pain.  He can walk up 3 flights of stairs to his apartment, gets short of breath at the top if carrying groceries.  No dyspnea walking on flat ground.  No orthopnea/PND. Unfortunately, he was laid off at the end of May. BP 130/74 today but has not taken his morning meds.   Medtronic ICD interrogation: No VT.  Stable thoracic impedance.   ECG (personally reviewed): NSR, left axis deviation, lateral T wave inversions, poor RWP.   Studies: Echo (12/15): EF 20-25% RV moderately HK CPX (2/16): RER 1.10, peak VO2 8.6, VE/VCO2 slope 43.7 => severely reduced functional capacity.  RHC (6/16): mean RA 8, PA 48/19 mean 30, mean PCWP 14, CI 2.03 Fick/2.28 Thermo, PVR 3.6.  Echo (5/17): EF 25%, mild LV dilation, moderate diastolic dysfunction, normal RV size with mild to moderate systolic dysfunction, mild mitral regurgitation CPX (4/19): Peak VO2 7, VE/VCO2 slope 41, RER 1.02 => submaximal but still suggestive of severe HF limitation.  Echo (6/19): EF 20%, diffuse severe HK, moderately decreased RV systolic function, moderate TR.  LHC/RHC (6/19): Severe native vessel CAD with patent LIMA-D, SVG-OM, and SVG-PLV; mean RA 15, PA 54/24 mean 37, mean PCWP 32, CI 1.86 Fick/1.64 thermo. PAPi 2, PVR 1.25 WU.  DC-CV 09/03/18 -->NSR   Labs:  8/15 LDL 88 12/15 K+ 4.2 creatinine 1.09  1/16 K 4.3, creatinine 1.34 4/16 K 4.5, creatinine 1.19 3/17  K 3.8, creatinine 1.22, HCT 35.3, BNP 1597 5/17 K 4.1, creatinine 1.25 6/18 LDL 138, K 4.4, creatinine 1.5 6/19 hgb 13.5, K 4.9, creatinine 1.53 => 1.5 => 1.4, BNP 992 08/07/2018: K 4.3 Creatinine 1.86  6/20: LDL 98, TSH normal, Hgb 13.5, K 4.3, creatinine 2.05  Review of systems complete and found to be negative unless listed in HPI.   SH:  Social History   Socioeconomic History  . Marital status: Divorced    Spouse name: Not on file  . Number of children: Not on file  . Years of education: Not on file  . Highest  education level: Not on file  Occupational History  . Not on file  Social Needs  . Financial resource strain: Not on file  . Food insecurity    Worry: Not on file    Inability: Not on file  . Transportation needs    Medical: Not on file    Non-medical: Not on file  Tobacco Use  . Smoking status: Never Smoker  . Smokeless tobacco: Never Used  Substance and Sexual Activity  . Alcohol use: No    Alcohol/week: 0.0 standard drinks  . Drug use: No  . Sexual activity: Not on file  Lifestyle  . Physical activity    Days per week: Not on file    Minutes per session: Not on file  . Stress: Not on file  Relationships  . Social Herbalist on phone: Not on file    Gets together: Not on file    Attends religious service: Not on file    Active member of club or organization: Not on file    Attends meetings of clubs or organizations: Not on file    Relationship status: Not on file  . Intimate partner violence    Fear of current or ex partner: Not on file    Emotionally abused: Not on file    Physically abused: Not on file    Forced sexual activity: Not on file  Other Topics Concern  . Not on file  Social History Narrative   Lives in [place] with his son and his family.  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    FH:  Family History  Problem Relation Age of Onset  . Diabetes Father        died in his 41's  . Hypertension Mother        died in her 58's - had a ppm.  . Amblyopia Neg Hx   . Blindness Neg Hx   . Cataracts Neg Hx   . Glaucoma Neg Hx   . Macular degeneration Neg Hx   . Retinal detachment Neg Hx   . Strabismus Neg Hx   . Retinitis pigmentosa Neg Hx     Past Medical History:  Diagnosis Date  . AICD (automatic cardioverter/defibrillator) present    Single-chamber  implantable cardiac defibrillator - Medtronic  . CHF (congestive heart failure) ([place])   . Chronic systolic heart failure ([place])    a. Echo 5/13: Mild LVE, mild  LVH, EF 10%, anteroseptal, lateral, apical AK, mild MR, mild LAE, moderately reduced RVSF, mild RAE, PASP 60;  b. 07/2014 Echo: EF 20-2%, diff HK, AKI of antsep/apical/mid-apicalinferior, mod reduced RV.  Marland Kitchen Coronary artery disease    a. s/p CABG 2002 b. LHC 5/13:  dLM 80%, LAD subtotally occluded, pCFX occluded, pRCA 50%, mid? Occlusion with high take off of the PDA with 70%  multiple lesions-not bypassed and supplies collaterals to LAD, LIMA-IM/ramus ok, S-OM ok, S-PLA branches ok. Medical therapy was recommended  . Gout    "on daily RX" (01/08/2018)  . Hypertension   . Ischemic cardiomyopathy    a. Echo 5/13: Mild LVE, mild LVH, EF 10%, anteroseptal, lateral, apical AK, mild MR, mild LAE, moderately reduced RVSF, mild RAE, PASP 60;  b. 01/2012 s/p MDT [id number] Protecta XT VR AICD;  c. 07/2014 Echo: EF 20-2%, diff HK, AKI of antsep/apical/mid-apicalinferior, mod reduced RV.  Marland Kitchen MRSA (methicillin resistant Staphylococcus aureus)    Status post right foot plantar deep infection with MRSA status post  I&D 10/2008  . Myocardial infarction [facility])    "was told I'd had several before heart OR in 2002" (01/08/2018)  . Noncompliance   . Peripheral neuropathy   . Syncope   . Type II diabetes mellitus ([place])    requiring insulin     Current Outpatient Medications  Medication Sig Dispense Refill  . allopurinol (ZYLOPRIM) 300 MG tablet Take 2 tablets (600 mg total) by mouth daily. (Patient taking differently: Take 600 mg by mouth daily as needed (gout). ) 180 tablet 3  . apixaban (ELIQUIS) 5 MG TABS tablet Take 1 tablet (5 mg total) by mouth 2 (two) times daily. 60 tablet 6  . BAYER MICROLET LANCETS lancets Use as instructed 100 each 11  . carvedilol (COREG) 12.5 MG tablet Take 1 tablet (12.5 mg total) by mouth 2 (two) times daily. 30 tablet 11  . digoxin (LANOXIN) 0.125 MG tablet Take 1 tablet (0.125 mg total) by mouth daily. 30 tablet 3  . ENTRESTO 49-51 MG TAKE 1 TABLET BY MOUTH TWICE A DAY 60 tablet 6  .  insulin aspart (NOVOLOG FLEXPEN) 100 UNIT/ML FlexPen Inject 5 units into the skin 3 times daily with meals.  Dx code: E11.9 15 mL 11  . Insulin Glargine (LANTUS SOLOSTAR) 100 UNIT/ML Solostar Pen Inject 60 Units into the skin at bedtime. 5 pen 5  . Insulin Pen Needle (BD PEN NEEDLE NANO U/F) 32G X 4 MM MISC USE 4 TIMES DAILY 100 each 11  . potassium chloride SA (K-DUR,KLOR-CON) 20 MEQ tablet Take 1 tablet (20 mEq total) by mouth daily. Take 1 tablet extra on days you take metolazone. 90 tablet 3  . rosuvastatin (CRESTOR) 40 MG tablet Take 1 tablet (40 mg total) by mouth daily. 30 tablet 11  . spironolactone (ALDACTONE) 25 MG tablet TAKE 1 TABLET BY MOUTH EVERY DAY 90 tablet 0  . torsemide (DEMADEX) 20 MG tablet Take 4 tablets (80 mg total) by mouth every morning AND 3 tablets (60 mg total) every evening. 210 tablet 6  . ezetimibe (ZETIA) 10 MG tablet Take 1 tablet (10 mg total) by mouth daily. 90 tablet 3   Current Facility-Administered Medications  Medication Dose Route Frequency Provider Last Rate Last Dose  . Bevacizumab (AVASTIN) SOLN 1.25 mg  1.25 mg Intravitreal  [name], MD   1.25 mg at 08/27/18 2139  . Bevacizumab (AVASTIN) SOLN 1.25 mg  1.25 mg Intravitreal  [name], MD   1.25 mg at 09/24/18 1722  . Bevacizumab (AVASTIN) SOLN 1.25 mg  1.25 mg Intravitreal  [name], MD   1.25 mg at 09/24/18 1722    Vitals:   01/30/19 0928  BP: 130/74  Pulse: 71  SpO2: 98%  Weight: 92.6 kg (204 lb 3.2 oz)   Wt Readings from Last 3 Encounters:  01/30/19 92.6 kg (204 lb 3.2 oz)  01/16/19  92.8 kg (204 lb 8 oz)  09/26/18 88 kg (194 lb)    PHYSICAL EXAM: General: NAD Neck: No JVD, no thyromegaly or thyroid nodule.  Lungs: Clear to auscultation bilaterally with normal respiratory effort. CV: Nondisplaced PMI.  Heart regular S1/S2, no S3/S4, no murmur.  No peripheral edema.  No carotid bruit.  Difficult to palpate pedal pulses.  Abdomen: Soft, nontender, no hepatosplenomegaly, no  distention.  Skin: Intact without lesions or rashes.  Neurologic: Alert and oriented x 3.  Psych: Normal affect. Extremities: No clubbing or cyanosis.  HEENT: Normal.   ASSESSMENT & PLAN: 1.Chronic systolic HF:ICM.HasMedtronic ICD. Narrow QRS, not CRT candidate. Has required milrinone multiple times in the past for low output HF. Offered advanced therapies in past but declined. Echo 6/19 with EF 20% and moderately decreased RV systolic function.CPX 4/19 with severe functional limitation due to [facility] 6/19 showed low output and coronary angiography showed no interventional targets.Echo 08/04/18: EF 20-25%, mild LVH, diffuse HK, akinesis of anteroseptal and anterior myocardium, grade 2 DD, trivial AI, LA mod dilated, RV moderately dilated, RA mildly dilated, moderate TR, PA peak pressure 49 mm Hg.NYHA class II symptoms.  He does not look volume overloaded by exam or Optivol.  Of note, creatinine up to 2 when recently checked.  - Hold torsemide for a day then decrease to 80 qam/60 qpm.  He can stop taking metolazone regularly and just keep for prn use.  - Check BMET today and again in 10 days.  - Continue Entresto49/51 mg BID - Continuespironolactone25 mg daily.  -Intolerant ivabradine due to nausea. - Continue coreg to 12.5 mg BID.  - Continue digoxin0.125 mg daily. Check digoxin level today.  - Has been very resistant to LVAD/transplant evaluation, both offered in past. Increasing creatinine is concerning.  I will arrange for CPX to reassess functional capacity.  2. CAD s/p CABG2002:Last cath in 6/19 with patent grafts. No chest pain.  - Continue ASA.  - He is on Crestor 40 daily but LDL > 70.  Will add Zetia 10 mg daily and repeat lipids in 2 months.  3. CKD Stage 3:Creatinine up to 2 when checked most recently. He does not look volume overloaded, so cutting back on diuretics as above.  - BMET today and in 10 days.  - I will make a nephrology referral.  4.Left foot  wound: MSSA osteomyelitis. Resolved and off antibiotics.  5. DM: Glucose had been high but has restarted Lantus, control improving.  6 Atrial flutter: s/p DC-CV 08/21/18.  He is in NSR today, denies palpitations.  - Continue Eliquis.   Followup with NP in 3-4 weeks to reassess volume.   Loralie Champagne, MD 02/01/2019

## 2019-02-02 ENCOUNTER — Other Ambulatory Visit (HOSPITAL_COMMUNITY): Payer: Self-pay | Admitting: Cardiology

## 2019-02-04 ENCOUNTER — Other Ambulatory Visit (HOSPITAL_COMMUNITY): Payer: Self-pay

## 2019-02-04 MED ORDER — CARVEDILOL 12.5 MG PO TABS: 12.5000 mg | ORAL_TABLET | Freq: Two times a day (BID) | ORAL | 11 refills | Status: DC

## 2019-02-05 ENCOUNTER — Telehealth (HOSPITAL_COMMUNITY): Payer: Self-pay | Admitting: Adult Health

## 2019-02-05 NOTE — Telephone Encounter (Signed)
COVID Screening competed.  -[facility]

## 2019-02-09 ENCOUNTER — Ambulatory Visit (HOSPITAL_COMMUNITY)
Admission: RE | Admit: 2019-02-09 | Discharge: 2019-02-09 | Disposition: A | Discharge status: Home / Self Care | Payer: Medicaid Other | Source: Ambulatory Visit | Attending: Cardiology | Admitting: Cardiology

## 2019-02-09 ENCOUNTER — Other Ambulatory Visit: Payer: Self-pay

## 2019-02-09 DIAGNOSIS — I5042 Chronic combined systolic (congestive) and diastolic (congestive) heart failure: Secondary | ICD-10-CM | POA: Insufficient documentation

## 2019-02-09 LAB — BASIC METABOLIC PANEL
Anion gap: 12 (ref 5–15)
BUN: 61 mg/dL — ABNORMAL HIGH (ref 6–20)
CO2: 25 mmol/L (ref 22–32)
Calcium: 9.9 mg/dL (ref 8.9–10.3)
Chloride: 104 mmol/L (ref 98–111)
Creatinine, Ser: 2.56 mg/dL — ABNORMAL HIGH (ref 0.61–1.24)
GFR calc Af Amer: 31 mL/min — ABNORMAL LOW (ref >60)
GFR calc non Af Amer: 27 mL/min — ABNORMAL LOW (ref >60)
Glucose, Bld: 215 mg/dL — ABNORMAL HIGH (ref 70–99)
Potassium: 3.9 mmol/L (ref 3.5–5.1)
Sodium: 141 mmol/L (ref 135–145)

## 2019-02-09 LAB — LIPID PANEL
Cholesterol: 95 mg/dL (ref 0–200)
HDL: 18 mg/dL — ABNORMAL LOW (ref >40)
LDL Cholesterol: 31 mg/dL (ref 0–99)
Total CHOL/HDL Ratio: 5.3 RATIO
Triglycerides: 231 mg/dL — ABNORMAL HIGH (ref <150)
VLDL: 46 mg/dL — ABNORMAL HIGH (ref 0–40)

## 2019-02-10 ENCOUNTER — Telehealth (HOSPITAL_COMMUNITY): Payer: Self-pay

## 2019-02-10 DIAGNOSIS — I5042 Chronic combined systolic (congestive) and diastolic (congestive) heart failure: Secondary | ICD-10-CM

## 2019-02-10 MED ORDER — TORSEMIDE 20 MG PO TABS: ORAL_TABLET | ORAL | 6 refills | Status: DC

## 2019-02-10 NOTE — Telephone Encounter (Signed)
-----   Message from Larey Dresser, MD sent at 02/09/2019  3:59 PM EDT ----- BUN better, creatinine still high.  Hold torsemide again for a day and then decrease torsemide to 80 qam/40 qpm.  BMET 1 week.  Good lipids.

## 2019-02-10 NOTE — Telephone Encounter (Signed)
Pt aware of lab results and med changes. Pt will RTC in 1 week for repeat lab work, appt made. Verbalized understanding

## 2019-02-13 ENCOUNTER — Other Ambulatory Visit: Payer: Self-pay | Admitting: Cardiology

## 2019-02-17 ENCOUNTER — Ambulatory Visit (HOSPITAL_COMMUNITY)
Admission: RE | Admit: 2019-02-17 | Discharge: 2019-02-17 | Disposition: A | Discharge status: Home / Self Care | Payer: Medicaid Other | Source: Ambulatory Visit | Attending: Cardiology | Admitting: Cardiology

## 2019-02-17 ENCOUNTER — Other Ambulatory Visit: Payer: Self-pay

## 2019-02-17 DIAGNOSIS — I5042 Chronic combined systolic (congestive) and diastolic (congestive) heart failure: Secondary | ICD-10-CM | POA: Insufficient documentation

## 2019-02-17 LAB — BASIC METABOLIC PANEL
Anion gap: 11 (ref 5–15)
BUN: 60 mg/dL — ABNORMAL HIGH (ref 6–20)
CO2: 29 mmol/L (ref 22–32)
Calcium: 10 mg/dL (ref 8.9–10.3)
Chloride: 100 mmol/L (ref 98–111)
Creatinine, Ser: 2.03 mg/dL — ABNORMAL HIGH (ref 0.61–1.24)
GFR calc Af Amer: 41 mL/min — ABNORMAL LOW (ref >60)
GFR calc non Af Amer: 35 mL/min — ABNORMAL LOW (ref >60)
Glucose, Bld: 226 mg/dL — ABNORMAL HIGH (ref 70–99)
Potassium: 4.2 mmol/L (ref 3.5–5.1)
Sodium: 140 mmol/L (ref 135–145)

## 2019-02-18 ENCOUNTER — Telehealth (HOSPITAL_COMMUNITY): Payer: Self-pay

## 2019-02-18 NOTE — Telephone Encounter (Signed)
Nephrology referral submitted end of June. Spoke with [facility], they are currently calling June referrals for appts.

## 2019-02-18 NOTE — Telephone Encounter (Signed)
-----   Message from Larey Dresser, MD sent at 02/17/2019  4:01 PM EDT ----- Creatinine better, no changes . If he does not have a nephrologist, he needs a referral.

## 2019-02-24 ENCOUNTER — Other Ambulatory Visit: Payer: Self-pay | Admitting: Family Medicine

## 2019-02-24 DIAGNOSIS — Z794 Long term (current) use of insulin: Secondary | ICD-10-CM

## 2019-02-24 DIAGNOSIS — E1122 Type 2 diabetes mellitus with diabetic chronic kidney disease: Secondary | ICD-10-CM

## 2019-02-27 ENCOUNTER — Ambulatory Visit (HOSPITAL_COMMUNITY)
Admission: RE | Admit: 2019-02-27 | Discharge: 2019-02-27 | Disposition: A | Discharge status: Home / Self Care | Payer: Self-pay | Source: Ambulatory Visit | Attending: Cardiology | Admitting: Cardiology

## 2019-02-27 ENCOUNTER — Other Ambulatory Visit: Payer: Self-pay

## 2019-02-27 ENCOUNTER — Encounter (HOSPITAL_COMMUNITY): Payer: Self-pay

## 2019-02-27 VITALS — BP 128/86 | HR 83 | Wt 205.2 lb

## 2019-02-27 DIAGNOSIS — M109 Gout, unspecified: Secondary | ICD-10-CM | POA: Insufficient documentation

## 2019-02-27 DIAGNOSIS — N183 Chronic kidney disease, stage 3 unspecified: Secondary | ICD-10-CM

## 2019-02-27 DIAGNOSIS — B9561 Methicillin susceptible Staphylococcus aureus infection as the cause of diseases classified elsewhere: Secondary | ICD-10-CM | POA: Insufficient documentation

## 2019-02-27 DIAGNOSIS — I5022 Chronic systolic (congestive) heart failure: Secondary | ICD-10-CM

## 2019-02-27 DIAGNOSIS — I255 Ischemic cardiomyopathy: Secondary | ICD-10-CM | POA: Insufficient documentation

## 2019-02-27 DIAGNOSIS — M869 Osteomyelitis, unspecified: Secondary | ICD-10-CM | POA: Insufficient documentation

## 2019-02-27 DIAGNOSIS — E1122 Type 2 diabetes mellitus with diabetic chronic kidney disease: Secondary | ICD-10-CM | POA: Insufficient documentation

## 2019-02-27 DIAGNOSIS — Z951 Presence of aortocoronary bypass graft: Secondary | ICD-10-CM | POA: Insufficient documentation

## 2019-02-27 DIAGNOSIS — I252 Old myocardial infarction: Secondary | ICD-10-CM | POA: Insufficient documentation

## 2019-02-27 DIAGNOSIS — E1169 Type 2 diabetes mellitus with other specified complication: Secondary | ICD-10-CM | POA: Insufficient documentation

## 2019-02-27 DIAGNOSIS — I130 Hypertensive heart and chronic kidney disease with heart failure and stage 1 through stage 4 chronic kidney disease, or unspecified chronic kidney disease: Secondary | ICD-10-CM | POA: Insufficient documentation

## 2019-02-27 DIAGNOSIS — E1140 Type 2 diabetes mellitus with diabetic neuropathy, unspecified: Secondary | ICD-10-CM | POA: Insufficient documentation

## 2019-02-27 DIAGNOSIS — Z8249 Family history of ischemic heart disease and other diseases of the circulatory system: Secondary | ICD-10-CM | POA: Insufficient documentation

## 2019-02-27 DIAGNOSIS — Z794 Long term (current) use of insulin: Secondary | ICD-10-CM | POA: Insufficient documentation

## 2019-02-27 DIAGNOSIS — Z833 Family history of diabetes mellitus: Secondary | ICD-10-CM | POA: Insufficient documentation

## 2019-02-27 DIAGNOSIS — Z79899 Other long term (current) drug therapy: Secondary | ICD-10-CM | POA: Insufficient documentation

## 2019-02-27 DIAGNOSIS — I5042 Chronic combined systolic (congestive) and diastolic (congestive) heart failure: Secondary | ICD-10-CM

## 2019-02-27 DIAGNOSIS — Z7901 Long term (current) use of anticoagulants: Secondary | ICD-10-CM | POA: Insufficient documentation

## 2019-02-27 DIAGNOSIS — Z95810 Presence of automatic (implantable) cardiac defibrillator: Secondary | ICD-10-CM | POA: Insufficient documentation

## 2019-02-27 DIAGNOSIS — I4892 Unspecified atrial flutter: Secondary | ICD-10-CM | POA: Insufficient documentation

## 2019-02-27 DIAGNOSIS — I25810 Atherosclerosis of coronary artery bypass graft(s) without angina pectoris: Secondary | ICD-10-CM | POA: Insufficient documentation

## 2019-02-27 NOTE — Patient Instructions (Signed)
Please follow up with the [facility] on Sept 29th at 8:40am.  At the [facility], you and your health needs are our priority. As part of our continuing mission to provide you with exceptional heart care, we have created designated Provider Care Teams. These Care Teams include your primary Cardiologist (physician) and Advanced Practice Providers (APPs- Physician Assistants and Nurse Practitioners) who all work together to provide you with the care you need, when you need it.   You may see any of the following providers on your designated Care Team at your next follow up: Marland Kitchen Dr Glori Bickers . Dr Loralie Champagne . Darrick Grinder, NP

## 2019-02-27 NOTE — Progress Notes (Signed)
Patient ID: Marcus Johnson., male   DOB: 07/08/1961, 58 y.o.   MRN: [id number] PCP: Dr. Sarajane Jews Cardiology: Dr. Aundra Dubin  HPI: Marcus Johnson is a 58 y.o. male with h/o DM2, CAD s/p CABG 3474, systolic HF due to ischemic cardiomyopathy with EF 20-25% (echo 12/15), DM2 and CKD. He is s/p Medtronic single chamber ICD.  Admitted in 12/15 due to ADHF. Required short course milrinone for diuresis. Diuresed 30 pounds.   CPX 2/16 showed severely reduced functional capacity.  There was a significant disconnect between his symptoms and the CPX.  RHC in 6/16 showed fairly normal filling pressures and low but not markedly low cardiac output.   CPX (4/19) was submaximal but suggested severe limitation from HF.   He was admitted in 6/19 with marked volume overload and dyspnea.  Echo in 6/19 showed EF 20% with severe global hypokinesis.  LHC/RHC showed severe native vessel CAD with patent LIMA-D, SVG-OM, and SVG-PLV; cardiac index low.  He was started on milrinone and diuresed.  We discussed LVAD extensively in light of low output, creatinine that is trending up and concern for decompensation of RV over time. He was adamant that he did not want to pursue LVAD workup yet and did not want a referral for transplant evaluation. Milrinone was weaned off and he was discharged home.   Admitted 08/01/18-08/07/18 with volume overload and left foot wound. Diuresed 38 lbs with IV lasix and then transitioned back to torsemide 100 mg BID. ID was consulted with concerns for left foot osteomyelitis noted on CT scan. He got IV ancef q8 hours x 42 days. [facility] provided teaching and supplies for home antibiotics. Course complicated by AKI. DC weight 200 lbs.   He went into atrial flutter and had DCCV to NSR in 1/20.   Today he returns for HF follow up. Overall feeling fine. SOB with steps. NO SOB on flat ground. Denies PND/Orthopnea. Appetite ok. Has been eating hot dogs and watermelon.  No fever or chills. Weight at home 200 pounds.  Taking all medications but says he will skip the afternoon torsemide 2-3 days a week.    Studies: Echo (12/15): EF 20-25% RV moderately HK CPX (2/16): RER 1.10, peak VO2 8.6, VE/VCO2 slope 43.7 => severely reduced functional capacity.  RHC (6/16): mean RA 8, PA 48/19 mean 30, mean PCWP 14, CI 2.03 Fick/2.28 Thermo, PVR 3.6.  Echo (5/17): EF 25%, mild LV dilation, moderate diastolic dysfunction, normal RV size with mild to moderate systolic dysfunction, mild mitral regurgitation CPX (4/19): Peak VO2 7, VE/VCO2 slope 41, RER 1.02 => submaximal but still suggestive of severe HF limitation.  Echo (6/19): EF 20%, diffuse severe HK, moderately decreased RV systolic function, moderate TR.  LHC/RHC (6/19): Severe native vessel CAD with patent LIMA-D, SVG-OM, and SVG-PLV; mean RA 15, PA 54/24 mean 37, mean PCWP 32, CI 1.86 Fick/1.64 thermo. PAPi 2, PVR 1.25 WU.  DC-CV 09/03/18 -->NSR   Labs:  8/15 LDL 88 12/15 K+ 4.2 creatinine 1.09  1/16 K 4.3, creatinine 1.34 4/16 K 4.5, creatinine 1.19 3/17 K 3.8, creatinine 1.22, HCT 35.3, BNP 1597 5/17 K 4.1, creatinine 1.25 6/18 LDL 138, K 4.4, creatinine 1.5 6/19 hgb 13.5, K 4.9, creatinine 1.53 => 1.5 => 1.4, BNP 992 08/07/2018: K 4.3 Creatinine 1.86  6/20: LDL 98, TSH normal, Hgb 13.5, K 4.3, creatinine 2.05 7/6/202: Creatinine 2.56 02/17/2019: Creatinine 2.0   Review of systems complete and found to be negative unless listed in HPI.   SH:  Social History   Socioeconomic History   Marital status: Divorced    Spouse name: Not on file   Number of children: Not on file   Years of education: Not on file   Highest education level: Not on file  Occupational History   Not on file  Social Needs   Financial resource strain: Not on file   Food insecurity    Worry: Not on file    Inability: Not on file   Transportation needs    Medical: Not on file    Non-medical: Not on file  Tobacco Use   Smoking status: Never Smoker   Smokeless  tobacco: Never Used  Substance and Sexual Activity   Alcohol use: No    Alcohol/week: 0.0 standard drinks   Drug use: No   Sexual activity: Not on file  Lifestyle   Physical activity    Days per week: Not on file    Minutes per session: Not on file   Stress: Not on file  Relationships   Social connections    Talks on phone: Not on file    Gets together: Not on file    Attends religious service: Not on file    Active member of club or organization: Not on file    Attends meetings of clubs or organizations: Not on file    Relationship status: Not on file   Intimate partner violence    Fear of current or ex partner: Not on file    Emotionally abused: Not on file    Physically abused: Not on file    Forced sexual activity: Not on file  Other Topics Concern   Not on file  Social History Narrative   Lives in [place] with his son and his family.  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    FH:  Family History  Problem Relation Age of Onset   Diabetes Father        died in his 4's   Hypertension Mother        died in her 38's - had a ppm.   Amblyopia Neg Hx    Blindness Neg Hx    Cataracts Neg Hx    Glaucoma Neg Hx    Macular degeneration Neg Hx    Retinal detachment Neg Hx    Strabismus Neg Hx    Retinitis pigmentosa Neg Hx     Past Medical History:  Diagnosis Date   AICD (automatic cardioverter/defibrillator) present    Single-chamber  implantable cardiac defibrillator - Medtronic   CHF (congestive heart failure) (HCC)    Chronic systolic heart failure ([place])    a. Echo 5/13: Mild LVE, mild LVH, EF 10%, anteroseptal, lateral, apical AK, mild MR, mild LAE, moderately reduced RVSF, mild RAE, PASP 60;  b. 07/2014 Echo: EF 20-2%, diff HK, AKI of antsep/apical/mid-apicalinferior, mod reduced RV.   Coronary artery disease    a. s/p CABG 2002 b. LHC 5/13:  dLM 80%, LAD subtotally occluded, pCFX occluded, pRCA 50%, mid?  Occlusion with high take off of the PDA with 70% multiple lesions-not bypassed and supplies collaterals to LAD, LIMA-IM/ramus ok, S-OM ok, S-PLA branches ok. Medical therapy was recommended   Gout    "on daily RX" (01/08/2018)   Hypertension    Ischemic cardiomyopathy    a. Echo 5/13: Mild LVE, mild LVH, EF 10%, anteroseptal, lateral, apical AK, mild MR, mild LAE, moderately reduced RVSF, mild RAE, PASP 60;  b. 01/2012 s/p  MDT [id number] Reino Bellis VR AICD;  c. 07/2014 Echo: EF 20-2%, diff HK, AKI of antsep/apical/mid-apicalinferior, mod reduced RV.   MRSA (methicillin resistant Staphylococcus aureus)    Status post right foot plantar deep infection with MRSA status post  I&D 10/2008   Myocardial infarction [facility])    "was told I'd had several before heart OR in 2002" (01/08/2018)   Noncompliance    Peripheral neuropathy    Syncope    Type II diabetes mellitus ([place])    requiring insulin     Current Outpatient Medications  Medication Sig Dispense Refill   allopurinol (ZYLOPRIM) 300 MG tablet Take 2 tablets (600 mg total) by mouth daily. (Patient taking differently: Take 600 mg by mouth daily as needed (gout). ) 180 tablet 3   apixaban (ELIQUIS) 5 MG TABS tablet Take 1 tablet (5 mg total) by mouth 2 (two) times daily. 60 tablet 6   BAYER MICROLET LANCETS lancets Use as instructed 100 each 11   carvedilol (COREG) 12.5 MG tablet Take 1 tablet (12.5 mg total) by mouth 2 (two) times daily. 30 tablet 11   digoxin (LANOXIN) 0.125 MG tablet TAKE 1 TABLET BY MOUTH EVERY DAY 30 tablet 3   ENTRESTO 49-51 MG TAKE 1 TABLET BY MOUTH TWICE A DAY 60 tablet 6   ezetimibe (ZETIA) 10 MG tablet Take 1 tablet (10 mg total) by mouth daily. 90 tablet 3   insulin aspart (NOVOLOG FLEXPEN) 100 UNIT/ML FlexPen Inject 5 units into the skin 3 times daily with meals.  Dx code: E11.9 15 mL 11   Insulin Glargine (LANTUS SOLOSTAR) 100 UNIT/ML Solostar Pen Inject 60 Units into the skin at bedtime. 5 pen 5    Insulin Pen Needle (BD PEN NEEDLE NANO U/F) 32G X 4 MM MISC USE 4 TIMES DAILY 100 each 11   potassium chloride SA (K-DUR,KLOR-CON) 20 MEQ tablet Take 1 tablet (20 mEq total) by mouth daily. Take 1 tablet extra on days you take metolazone. 90 tablet 3   rosuvastatin (CRESTOR) 40 MG tablet TAKE 1 TABLET BY MOUTH EVERY DAY 90 tablet 3   spironolactone (ALDACTONE) 25 MG tablet TAKE 1 TABLET BY MOUTH EVERY DAY 90 tablet 0   torsemide (DEMADEX) 20 MG tablet Take 4 tablets (80 mg total) by mouth every morning AND 2 tablets (40 mg total) every evening. 180 tablet 6   Current Facility-Administered Medications  Medication Dose Route Frequency Provider Last Rate Last Dose   Bevacizumab (AVASTIN) SOLN 1.25 mg  1.25 mg Intravitreal  [name], MD   1.25 mg at 08/27/18 2139   Bevacizumab (AVASTIN) SOLN 1.25 mg  1.25 mg Intravitreal  [name], MD   1.25 mg at 09/24/18 1722   Bevacizumab (AVASTIN) SOLN 1.25 mg  1.25 mg Intravitreal  [name], MD   1.25 mg at 09/24/18 1722    Vitals:   02/27/19 1006  BP: 128/86  Pulse: 83  SpO2: 98%  Weight: 93.1 kg (205 lb 3.2 oz)   Wt Readings from Last 3 Encounters:  02/27/19 93.1 kg (205 lb 3.2 oz)  01/30/19 92.6 kg (204 lb 3.2 oz)  01/16/19 92.8 kg (204 lb 8 oz)    PHYSICAL EXAM: General:  Well appearing. No resp difficulty HEENT: normal Neck: supple. JVP 6-7  Carotids 2+ bilat; no bruits. No lymphadenopathy or thryomegaly appreciated. Cor: PMI nondisplaced. Regular rate & rhythm. No rubs, gallops or murmurs. Lungs: clear Abdomen: soft, nontender, nondistended. No hepatosplenomegaly. No bruits or masses. Good bowel sounds. Extremities: no  cyanosis, clubbing, rash, edema Neuro: alert & orientedx3, cranial nerves grossly intact. moves all 4 extremities w/o difficulty. Affect pleasant   ASSESSMENT & PLAN: 1.Chronic systolic HF:ICM.HasMedtronic ICD. Narrow QRS, not CRT candidate. Has required milrinone multiple times in the past for  low output HF. Offered advanced therapies in past but declined. Echo 6/19 with EF 20% and moderately decreased RV systolic function.CPX 4/19 with severe functional limitation due to [facility] 6/19 showed low output and coronary angiography showed no interventional targets.Echo 08/04/18: EF 20-25%, mild LVH, diffuse HK, akinesis of anteroseptal and anterior myocardium, grade 2 DD, trivial AI, LA mod dilated, RV moderately dilated, RA mildly dilated, moderate TR, PA peak pressure 49 mm Hg. NYHA II-III.  Volume status stable. Continue torsemide  80 qam/40 qpm.  Continue metolazone as needed. Discussed low salt food choices.  - Continue Entresto49/51 mg BID - Continuespironolactone25 mg daily.  -Intolerant ivabradine due to nausea. - Continue coreg to 12.5 mg BID.  - Continue digoxin0.125 mg daily. Check digoxin level today.  - Has been very resistant to LVAD/transplant evaluation, both offered in past. Increasing creatinine is concerning.  I will arrange for CPX to reassess functional capacity.  2. CAD s/p CABG2002:Last cath in 6/19 with patent grafts. No chest pain.    - Continue ASA.  - He is on Crestor 40 daily but LDL > 70.  Continue Zetia 10 mg daily and repeat lipids next visit.  months.  3. CKD Stage 3: Renal function improve with low dose of diuretics.  4.Left foot wound: MSSA osteomyelitis. Resolved and off antibiotics.  5. DM: Glucose had been high but has restarted Lantus, control improving.  6 Atrial flutter: s/p DC-CV 08/21/18.  No palpitations.   - Continue Eliquis.   Follow up with Dr Aundra Dubin in 2-3  months.  Darrick Grinder, NP 02/27/2019

## 2019-03-04 ENCOUNTER — Other Ambulatory Visit: Payer: Self-pay | Admitting: Family Medicine

## 2019-03-13 ENCOUNTER — Other Ambulatory Visit: Payer: Self-pay | Admitting: Cardiology

## 2019-03-17 ENCOUNTER — Ambulatory Visit (INDEPENDENT_AMBULATORY_CARE_PROVIDER_SITE_OTHER): Payer: BLUE CROSS/BLUE SHIELD | Admitting: *Deleted

## 2019-03-17 DIAGNOSIS — I255 Ischemic cardiomyopathy: Secondary | ICD-10-CM

## 2019-03-17 LAB — CUP PACEART REMOTE DEVICE CHECK
Battery Voltage: 2.82 V
Brady Statistic RV Percent Paced: 0.01 %
Date Time Interrogation Session: 20200811103529
HighPow Impedance: 266 Ohm
HighPow Impedance: 36 Ohm
HighPow Impedance: 42 Ohm
Implantable Lead Implant Date: 20130603
Implantable Lead Location: 753860
Implantable Lead Model: 6947
Implantable Pulse Generator Implant Date: 20130603
Lead Channel Impedance Value: 342 Ohm
Lead Channel Pacing Threshold Amplitude: 0.75 V
Lead Channel Pacing Threshold Pulse Width: 0.4 ms
Lead Channel Sensing Intrinsic Amplitude: 10.25 mV
Lead Channel Sensing Intrinsic Amplitude: 10.25 mV
Lead Channel Setting Pacing Amplitude: 2.5 V
Lead Channel Setting Pacing Pulse Width: 0.4 ms
Lead Channel Setting Sensing Sensitivity: 0.3 mV

## 2019-03-19 ENCOUNTER — Encounter (HOSPITAL_COMMUNITY): Payer: Self-pay | Admitting: *Deleted

## 2019-03-19 NOTE — Progress Notes (Signed)
Attempted to schedule patient for CPX. Patient declined CPX with concerns over too many existing bills currently, as well as with further concerns surrounding the required COVID testing prior to CPX.     Marcus Martins, MS, ACSM-RCEP Clinical Exercise Physiologist

## 2019-03-25 ENCOUNTER — Encounter: Payer: Self-pay | Admitting: Cardiology

## 2019-03-25 NOTE — Progress Notes (Signed)
Remote ICD transmission.   

## 2019-03-27 ENCOUNTER — Telehealth: Payer: Self-pay

## 2019-03-27 NOTE — Telephone Encounter (Signed)
No paper work has been received yet. Will reach out to the patient.

## 2019-03-27 NOTE — Telephone Encounter (Signed)
Copied from [place] [phone]. Topic: General - Inquiry >> Mar 25, 2019  2:58 PM Marcus Johnson wrote: Patient calling to inquire if office has received fax regarding disability request on his behalf.

## 2019-03-27 NOTE — Telephone Encounter (Signed)
Spoke with patient he is aware that we have not received and paper work and will have it re-faxed.

## 2019-04-01 ENCOUNTER — Ambulatory Visit (HOSPITAL_COMMUNITY)
Admission: RE | Admit: 2019-04-01 | Discharge: 2019-04-01 | Disposition: A | Discharge status: Home / Self Care | Payer: Medicaid Other | Source: Ambulatory Visit | Attending: Cardiology | Admitting: Cardiology

## 2019-04-01 ENCOUNTER — Other Ambulatory Visit (HOSPITAL_COMMUNITY): Payer: Self-pay

## 2019-04-01 ENCOUNTER — Other Ambulatory Visit: Payer: Self-pay

## 2019-04-01 DIAGNOSIS — I5042 Chronic combined systolic (congestive) and diastolic (congestive) heart failure: Secondary | ICD-10-CM

## 2019-04-01 LAB — LIPID PANEL
Cholesterol: 87 mg/dL (ref 0–200)
HDL: 22 mg/dL — ABNORMAL LOW (ref >40)
LDL Cholesterol: 34 mg/dL (ref 0–99)
Total CHOL/HDL Ratio: 4 RATIO
Triglycerides: 156 mg/dL — ABNORMAL HIGH (ref <150)
VLDL: 31 mg/dL (ref 0–40)

## 2019-05-05 ENCOUNTER — Ambulatory Visit (HOSPITAL_COMMUNITY)
Admission: RE | Admit: 2019-05-05 | Discharge: 2019-05-05 | Disposition: A | Discharge status: Home / Self Care | Payer: BC Managed Care – PPO | Source: Ambulatory Visit | Attending: Cardiology | Admitting: Cardiology

## 2019-05-05 ENCOUNTER — Other Ambulatory Visit: Payer: Self-pay

## 2019-05-05 VITALS — BP 128/65 | HR 88 | Wt 209.0 lb

## 2019-05-05 DIAGNOSIS — R42 Dizziness and giddiness: Secondary | ICD-10-CM | POA: Diagnosis not present

## 2019-05-05 DIAGNOSIS — Z7901 Long term (current) use of anticoagulants: Secondary | ICD-10-CM | POA: Diagnosis not present

## 2019-05-05 DIAGNOSIS — I252 Old myocardial infarction: Secondary | ICD-10-CM | POA: Diagnosis not present

## 2019-05-05 DIAGNOSIS — Z8249 Family history of ischemic heart disease and other diseases of the circulatory system: Secondary | ICD-10-CM | POA: Diagnosis not present

## 2019-05-05 DIAGNOSIS — I255 Ischemic cardiomyopathy: Secondary | ICD-10-CM | POA: Insufficient documentation

## 2019-05-05 DIAGNOSIS — Z794 Long term (current) use of insulin: Secondary | ICD-10-CM | POA: Diagnosis not present

## 2019-05-05 DIAGNOSIS — Z95810 Presence of automatic (implantable) cardiac defibrillator: Secondary | ICD-10-CM | POA: Insufficient documentation

## 2019-05-05 DIAGNOSIS — Z951 Presence of aortocoronary bypass graft: Secondary | ICD-10-CM | POA: Diagnosis not present

## 2019-05-05 DIAGNOSIS — E1122 Type 2 diabetes mellitus with diabetic chronic kidney disease: Secondary | ICD-10-CM | POA: Diagnosis not present

## 2019-05-05 DIAGNOSIS — I4892 Unspecified atrial flutter: Secondary | ICD-10-CM | POA: Diagnosis not present

## 2019-05-05 DIAGNOSIS — I130 Hypertensive heart and chronic kidney disease with heart failure and stage 1 through stage 4 chronic kidney disease, or unspecified chronic kidney disease: Secondary | ICD-10-CM | POA: Insufficient documentation

## 2019-05-05 DIAGNOSIS — R0602 Shortness of breath: Secondary | ICD-10-CM | POA: Diagnosis not present

## 2019-05-05 DIAGNOSIS — N189 Chronic kidney disease, unspecified: Secondary | ICD-10-CM | POA: Diagnosis not present

## 2019-05-05 DIAGNOSIS — I2510 Atherosclerotic heart disease of native coronary artery without angina pectoris: Secondary | ICD-10-CM

## 2019-05-05 DIAGNOSIS — I5022 Chronic systolic (congestive) heart failure: Secondary | ICD-10-CM | POA: Insufficient documentation

## 2019-05-05 DIAGNOSIS — I5042 Chronic combined systolic (congestive) and diastolic (congestive) heart failure: Secondary | ICD-10-CM

## 2019-05-05 DIAGNOSIS — M109 Gout, unspecified: Secondary | ICD-10-CM | POA: Diagnosis not present

## 2019-05-05 DIAGNOSIS — Z79899 Other long term (current) drug therapy: Secondary | ICD-10-CM | POA: Insufficient documentation

## 2019-05-05 DIAGNOSIS — I480 Paroxysmal atrial fibrillation: Secondary | ICD-10-CM | POA: Diagnosis not present

## 2019-05-05 LAB — BASIC METABOLIC PANEL
Anion gap: 14 (ref 5–15)
BUN: 52 mg/dL — ABNORMAL HIGH (ref 6–20)
CO2: 25 mmol/L (ref 22–32)
Calcium: 9.6 mg/dL (ref 8.9–10.3)
Chloride: 95 mmol/L — ABNORMAL LOW (ref 98–111)
Creatinine, Ser: 1.94 mg/dL — ABNORMAL HIGH (ref 0.61–1.24)
GFR calc Af Amer: 43 mL/min — ABNORMAL LOW (ref >60)
GFR calc non Af Amer: 37 mL/min — ABNORMAL LOW (ref >60)
Glucose, Bld: 443 mg/dL — ABNORMAL HIGH (ref 70–99)
Potassium: 4.4 mmol/L (ref 3.5–5.1)
Sodium: 134 mmol/L — ABNORMAL LOW (ref 135–145)

## 2019-05-05 LAB — DIGOXIN LEVEL: Digoxin Level: <0.2 ng/mL — ABNORMAL LOW (ref 0.8–2.0)

## 2019-05-05 NOTE — Patient Instructions (Signed)
Labs done today. We will contact you only if your labs are abnormal.  No medication changes were made today. Please continue all current medications as prescribed.  Your physician recommends that you schedule a follow-up appointment in: 2 months with an echo prior to your appointment.  Your physician has requested that you have an echocardiogram. Echocardiography is a painless test that uses sound waves to create images of your heart. It provides your doctor with information about the size and shape of your heart and how well your heart's chambers and valves are working. This procedure takes approximately one hour. There are no restrictions for this procedure.  Your physician has recommended that you have a cardiopulmonary stress test (CPX). CPX testing is a non-invasive measurement of heart and lung function. It replaces a traditional treadmill stress test. This type of test provides a tremendous amount of  information that relates not only to your present condition but also for future outcomes. This test combines measurements of you ventilation, respiratory gas exchange in the lungs, electrocardiogram (EKG), blood pressure and physical response before, during, and following an exercise protocol. You will have to have a COVID-19 screening done prior to this appointment. Someone will contact you about an appointment. See handout for additional information.    At the [facility], you and your health needs are our priority. As part of our continuing mission to provide you with exceptional heart care, we have created designated Provider Care Teams. These Care Teams include your primary Cardiologist (physician) and Advanced Practice Providers (APPs- Physician Assistants and Nurse Practitioners) who all work together to provide you with the care you need, when you need it.   You may see any of the following providers on your designated Care Team at your next follow up: Marland Kitchen Dr Glori Bickers . Dr Loralie Champagne . Darrick Grinder, NP   Please be sure to bring in all your medications bottles to every appointment.

## 2019-05-05 NOTE — Progress Notes (Signed)
Patient ID: Marcus Johnson., male   DOB: 1960/12/15, 58 y.o.   MRN: [id number] PCP: Dr. Sarajane Jews Cardiology: Dr. Aundra Dubin  HPI: Marcus Johnson is a 57 y.o. male with h/o DM2, CAD s/p CABG 9371, systolic HF due to ischemic cardiomyopathy with EF 20-25% (echo 12/15), DM2 and CKD. He is s/p Medtronic single chamber ICD.  Admitted in 12/15 due to ADHF. Required short course milrinone for diuresis. Diuresed 30 pounds.   CPX 2/16 showed severely reduced functional capacity.  There was a significant disconnect between his symptoms and the CPX.  RHC in 6/16 showed fairly normal filling pressures and low but not markedly low cardiac output.   CPX (4/19) was submaximal but suggested severe limitation from HF.   He was admitted in 6/19 with marked volume overload and dyspnea.  Echo in 6/19 showed EF 20% with severe global hypokinesis.  LHC/RHC showed severe native vessel CAD with patent LIMA-D, SVG-OM, and SVG-PLV; cardiac index low.  He was started on milrinone and diuresed.  We discussed LVAD extensively in light of low output, creatinine that is trending up and concern for decompensation of RV over time. He was adamant that he did not want to pursue LVAD workup yet and did not want a referral for transplant evaluation. Milrinone was weaned off and he was discharged home.   Admitted 08/01/18-08/07/18 with volume overload and left foot wound. Diuresed 38 lbs with IV lasix and then transitioned back to torsemide 100 mg BID. ID was consulted with concerns for left foot osteomyelitis noted on CT scan. He got IV ancef q8 hours x 42 days. [facility] provided teaching and supplies for home antibiotics. Course complicated by AKI. DC weight 200 lbs.   He went into atrial flutter and had DCCV to NSR in 1/20.   He returns for followup of CHF.  He remains limited, not doing much.  He can walk up 3 flights of stairs but very slowly.  He is short of breath walking up inclines.  OK with ADLs.  No orthopnea/PND.  He is out of work,  waiting on disability.  He is occasional lightheaded after taking his meds.  Weight is up 4 lbs.  Creatinine has slowly risen.      Medtronic ICD interrogation: No VT.  Stable thoracic impedance.   ECG (personally reviewed): NSR, left axis deviation, lateral T wave inversions, poor RWP.   Studies: Echo (12/15): EF 20-25% RV moderately HK CPX (2/16): RER 1.10, peak VO2 8.6, VE/VCO2 slope 43.7 => severely reduced functional capacity.  RHC (6/16): mean RA 8, PA 48/19 mean 30, mean PCWP 14, CI 2.03 Fick/2.28 Thermo, PVR 3.6.  Echo (5/17): EF 25%, mild LV dilation, moderate diastolic dysfunction, normal RV size with mild to moderate systolic dysfunction, mild mitral regurgitation CPX (4/19): Peak VO2 7, VE/VCO2 slope 41, RER 1.02 => submaximal but still suggestive of severe HF limitation.  Echo (6/19): EF 20%, diffuse severe HK, moderately decreased RV systolic function, moderate TR.  LHC/RHC (6/19): Severe native vessel CAD with patent LIMA-D, SVG-OM, and SVG-PLV; mean RA 15, PA 54/24 mean 37, mean PCWP 32, CI 1.86 Fick/1.64 thermo. PAPi 2, PVR 1.25 WU.  DC-CV 09/03/18 -->NSR   Labs:  8/15 LDL 88 12/15 K+ 4.2 creatinine 1.09  1/16 K 4.3, creatinine 1.34 4/16 K 4.5, creatinine 1.19 3/17 K 3.8, creatinine 1.22, HCT 35.3, BNP 1597 5/17 K 4.1, creatinine 1.25 6/18 LDL 138, K 4.4, creatinine 1.5 6/19 hgb 13.5, K 4.9, creatinine 1.53 => 1.5 => 1.4,  BNP 992 08/07/2018: K 4.3 Creatinine 1.86  6/20: LDL 98, TSH normal, Hgb 13.5, K 4.3, creatinine 2.05 7/20: K 4.2, creatinine 2.56 => 2.03 8/20: LDL 34, HDL 22  ECG: NSR, left axis deviation, old ASMI  Review of systems complete and found to be negative unless listed in HPI.   SH:  Social History   Socioeconomic History  . Marital status: Divorced    Spouse name: Not on file  . Number of children: Not on file  . Years of education: Not on file  . Highest education level: Not on file  Occupational History  . Not on file  Social Needs  .  Financial resource strain: Not on file  . Food insecurity    Worry: Not on file    Inability: Not on file  . Transportation needs    Medical: Not on file    Non-medical: Not on file  Tobacco Use  . Smoking status: Never Smoker  . Smokeless tobacco: Never Used  Substance and Sexual Activity  . Alcohol use: No    Alcohol/week: 0.0 standard drinks  . Drug use: No  . Sexual activity: Not on file  Lifestyle  . Physical activity    Days per week: Not on file    Minutes per session: Not on file  . Stress: Not on file  Relationships  . Social Herbalist on phone: Not on file    Gets together: Not on file    Attends religious service: Not on file    Active member of club or organization: Not on file    Attends meetings of clubs or organizations: Not on file    Relationship status: Not on file  . Intimate partner violence    Fear of current or ex partner: Not on file    Emotionally abused: Not on file    Physically abused: Not on file    Forced sexual activity: Not on file  Other Topics Concern  . Not on file  Social History Narrative   [place] and his family.  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    FH:  Family History  Problem Relation Age of Onset  . Diabetes Father        died in his 8's  . Hypertension Mother        died in her 27's - had a ppm.  . Amblyopia Neg Hx   . Blindness Neg Hx   . Cataracts Neg Hx   . Glaucoma Neg Hx   . Macular degeneration Neg Hx   . Retinal detachment Neg Hx   . Strabismus Neg Hx   . Retinitis pigmentosa Neg Hx     Past Medical History:  Diagnosis Date  . AICD (automatic cardioverter/defibrillator) present    Single-chamber  implantable cardiac defibrillator - Medtronic  . CHF (congestive heart failure) ([place])   . Chronic systolic heart failure ([place])    a. Echo 5/13: Mild LVE, mild LVH, EF 10%, anteroseptal, lateral, apical AK, mild MR, mild LAE, moderately reduced  RVSF, mild RAE, PASP 60;  b. 07/2014 Echo: EF 20-2%, diff HK, AKI of antsep/apical/mid-apicalinferior, mod reduced RV.  Marland Kitchen Coronary artery disease    a. s/p CABG 2002 b. LHC 5/13:  dLM 80%, LAD subtotally occluded, pCFX occluded, pRCA 50%, mid? Occlusion with high take off of the PDA with 70% multiple lesions-not bypassed and supplies collaterals to LAD, LIMA-IM/ramus ok, S-OM  ok, S-PLA branches ok. Medical therapy was recommended  . Gout    "on daily RX" (01/08/2018)  . Hypertension   . Ischemic cardiomyopathy    a. Echo 5/13: Mild LVE, mild LVH, EF 10%, anteroseptal, lateral, apical AK, mild MR, mild LAE, moderately reduced RVSF, mild RAE, PASP 60;  b. 01/2012 s/p MDT [id number] Protecta XT VR AICD;  c. 07/2014 Echo: EF 20-2%, diff HK, AKI of antsep/apical/mid-apicalinferior, mod reduced RV.  Marland Kitchen MRSA (methicillin resistant Staphylococcus aureus)    Status post right foot plantar deep infection with MRSA status post  I&D 10/2008  . Myocardial infarction [facility])    "was told I'd had several before heart OR in 2002" (01/08/2018)  . Noncompliance   . Peripheral neuropathy   . Syncope   . Type II diabetes mellitus ([place])    requiring insulin     Current Outpatient Medications  Medication Sig Dispense Refill  . allopurinol (ZYLOPRIM) 300 MG tablet Take 2 tablets (600 mg total) by mouth daily. (Patient taking differently: Take 600 mg by mouth daily as needed (gout). ) 180 tablet 3  . apixaban (ELIQUIS) 5 MG TABS tablet Take 1 tablet (5 mg total) by mouth 2 (two) times daily. 60 tablet 6  . BAYER MICROLET LANCETS lancets Use as instructed 100 each 11  . carvedilol (COREG) 12.5 MG tablet Take 1 tablet (12.5 mg total) by mouth 2 (two) times daily. 30 tablet 11  . digoxin (LANOXIN) 0.125 MG tablet TAKE 1 TABLET BY MOUTH EVERY DAY 30 tablet 3  . ENTRESTO 49-51 MG TAKE 1 TABLET BY MOUTH TWICE A DAY 60 tablet 6  . insulin aspart (NOVOLOG FLEXPEN) 100 UNIT/ML FlexPen Inject 5 units into the skin 3 times daily with  meals.  Dx code: E11.9 15 mL 11  . Insulin Glargine (LANTUS SOLOSTAR) 100 UNIT/ML Solostar Pen Inject 60 Units into the skin at bedtime. 5 pen 5  . Insulin Pen Needle (BD PEN NEEDLE NANO U/F) 32G X 4 MM MISC USE 4 TIMES DAILY 100 each 11  . potassium chloride SA (K-DUR,KLOR-CON) 20 MEQ tablet Take 1 tablet (20 mEq total) by mouth daily. Take 1 tablet extra on days you take metolazone. 90 tablet 3  . rosuvastatin (CRESTOR) 40 MG tablet TAKE 1 TABLET BY MOUTH EVERY DAY 90 tablet 3  . spironolactone (ALDACTONE) 25 MG tablet TAKE 1 TABLET BY MOUTH EVERY DAY 90 tablet 0  . torsemide (DEMADEX) 20 MG tablet Take 4 tablets (80 mg total) by mouth every morning AND 2 tablets (40 mg total) every evening. 180 tablet 6   Current Facility-Administered Medications  Medication Dose Route Frequency Provider Last Rate Last Dose  . Bevacizumab (AVASTIN) SOLN 1.25 mg  1.25 mg Intravitreal  [name], MD   1.25 mg at 08/27/18 2139  . Bevacizumab (AVASTIN) SOLN 1.25 mg  1.25 mg Intravitreal  [name], MD   1.25 mg at 09/24/18 1722  . Bevacizumab (AVASTIN) SOLN 1.25 mg  1.25 mg Intravitreal  [name], MD   1.25 mg at 09/24/18 1722    Vitals:   05/05/19 0901  BP: 128/65  Pulse: 88  SpO2: 97%  Weight: 94.8 kg (209 lb)   Wt Readings from Last 3 Encounters:  05/05/19 94.8 kg (209 lb)  02/27/19 93.1 kg (205 lb 3.2 oz)  01/30/19 92.6 kg (204 lb 3.2 oz)    PHYSICAL EXAM: General: NAD Neck: No JVD, no thyromegaly or thyroid nodule.  Lungs: Clear to auscultation bilaterally with normal respiratory  effort. CV: Nondisplaced PMI.  Heart regular S1/S2, no S3/S4, no murmur.  No peripheral edema.  No carotid bruit.  Normal pedal pulses.  Abdomen: Soft, nontender, no hepatosplenomegaly, no distention.  Skin: Intact without lesions or rashes.  Neurologic: Alert and oriented x 3.  Psych: Normal affect. Extremities: No clubbing or cyanosis.  HEENT: Normal.   ASSESSMENT & PLAN: 1.Chronic systolic  HF:ICM.HasMedtronic ICD. Narrow QRS, not CRT candidate. Has required milrinone multiple times in the past for low output HF. Offered advanced therapies in past but declined. Echo 6/19 with EF 20% and moderately decreased RV systolic function.CPX 4/19 with severe functional limitation due to [facility] 6/19 showed low output and coronary angiography showed no interventional targets.Echo 08/04/18: EF 20-25%, mild LVH, diffuse HK, akinesis of anteroseptal and anterior myocardium, grade 2 DD, trivial AI, LA mod dilated, RV moderately dilated, RA mildly dilated, moderate TR, PA peak pressure 49 mm Hg.NYHA class III symptoms chronically.  He does not look volume overloaded by exam or Optivol; I suspect that a lot of his symptomatology is due to low output HF.  Creatinine has been rising, likely partially cardiorenal.   - Continue torsemide 100 mg daily (has been taking it this way rather than 80 qam/40 qpm).  BMET today.  - Continue Entresto49/51 mg BID - Continuespironolactone25 mg daily.  -Intolerant ivabradine due to nausea. - Continue coreg to 12.5 mg BID.  - Continue digoxin0.125 mg daily. Check digoxin level today.  - Echo at next appointment.  - Has been very resistant to LVAD/transplant evaluation, both offered in past. We discussed LVAD again today.Increasing creatinine is concerning, suspect this is in part cardiorenal.  I worry that we will miss the window for advanced therapies.  I will arrange for CPX to reassess functional capacity.  - We discussed barostimulation as a way to potentially help with symptoms but not as invasive as LVAD.  He will talk with my research nurse about this today.  - QRS not wide, not CRT candidate.  2. CAD s/p CABG2002:Last cath in 6/19 with patent grafts. No chest pain.  - Continue ASA.  - Good lipids in 8/20 on Crestor 40 mg daily.  3. CKD Stage 3:Creatinine up to 2 when checked most recently. I suspect that this is a combination of cardiorenal  syndrome and diabetic nephropathy.  - BMET  - I recommended nephrology referral but he does not want this yet due to finances.   4. DM: Working with primary care.   5 Atrial flutter: s/p DC-CV 08/21/18.  He is in NSR today, denies palpitations.  - Continue Eliquis.   I will have the social worker see him to help with his financial issues.  Followup in 2 months with echo.    Loralie Champagne, MD 05/05/2019

## 2019-05-05 NOTE — Progress Notes (Signed)
CSW consulted to help with pt insurance concerns.  CSW met with pt in room to discuss.  Pt states that he does have insurance through [place] explained that the only insurance we had on file for this patient was for [employer].  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  CSW took card and had scanned into system.  Pt states his copay for his lantus was $135- CSW inquired if pharmacy has his new insurance card and pt states that he tried to give it to them but they didn't take it- CSW explained that this could be they thought it was the same policy as his previous BCBS and encouraged pt to give them his new card and then call CSW if his copays are still high.    Pt also concerned about copay costs for entresto and eliquis- states he had a supply of them but he is worried about buying them again without copay card- states he had copay card last year but no longer working.  CSW provided with new copay card for both entresto and eliquis.  Pt will continue to follow through clinic and assist as needed  Jorge Ny, [facility] Desk#: [phone] Cell#: [phone]

## 2019-05-06 ENCOUNTER — Telehealth (HOSPITAL_COMMUNITY): Payer: Self-pay

## 2019-05-06 NOTE — Telephone Encounter (Signed)
LM for patient that labs are stable and for him to make sure he is taking his dig.  Asked that he call us to let us know.

## 2019-05-06 NOTE — Telephone Encounter (Signed)
-----   Message from Larey Dresser, MD sent at 05/05/2019  4:16 PM EDT ----- Labs are stable.  Make sure he is taking digoxin.

## 2019-05-06 NOTE — Telephone Encounter (Signed)
Aware of lab results. Pt called back stating that he takes his Dig daily. Routed to MD

## 2019-05-16 ENCOUNTER — Other Ambulatory Visit: Payer: Self-pay | Admitting: Cardiology

## 2019-05-18 NOTE — Telephone Encounter (Signed)
Patient called in stating disability office has mailed forms to practice 3 times and has gotten no response as of yet .  Verified mailing address with patient.  Call back number [phone] please advise

## 2019-06-08 ENCOUNTER — Telehealth (HOSPITAL_COMMUNITY): Payer: Self-pay | Admitting: Licensed Clinical Social Worker

## 2019-06-08 NOTE — Telephone Encounter (Signed)
CSW able to speak with Disability Determination case worker who confirms need for records from patients clinic visits.  CSW confirmed in scanned documents we had a release to send this information and sent requested documents.  CSW will continue to follow and assist as needed.  Jorge Ny, LCSW Clinical Social Worker Advanced Heart Failure Clinic Desk#: [phone] Cell#: [phone]

## 2019-06-16 ENCOUNTER — Ambulatory Visit (INDEPENDENT_AMBULATORY_CARE_PROVIDER_SITE_OTHER): Payer: BC Managed Care – PPO | Admitting: *Deleted

## 2019-06-16 DIAGNOSIS — I5023 Acute on chronic systolic (congestive) heart failure: Secondary | ICD-10-CM

## 2019-06-16 DIAGNOSIS — I255 Ischemic cardiomyopathy: Secondary | ICD-10-CM

## 2019-06-16 LAB — CUP PACEART REMOTE DEVICE CHECK
Battery Voltage: 2.78 V
Brady Statistic RV Percent Paced: 0.01 %
Date Time Interrogation Session: 20201110062207
HighPow Impedance: 342 Ohm
HighPow Impedance: 41 Ohm
HighPow Impedance: 50 Ohm
Implantable Lead Implant Date: 20130603
Implantable Lead Location: 753860
Implantable Lead Model: 6947
Implantable Pulse Generator Implant Date: 20130603
Lead Channel Impedance Value: 399 Ohm
Lead Channel Pacing Threshold Amplitude: 0.875 V
Lead Channel Pacing Threshold Pulse Width: 0.4 ms
Lead Channel Sensing Intrinsic Amplitude: 8.875 mV
Lead Channel Sensing Intrinsic Amplitude: 8.875 mV
Lead Channel Setting Pacing Amplitude: 2.5 V
Lead Channel Setting Pacing Pulse Width: 0.4 ms
Lead Channel Setting Sensing Sensitivity: 0.3 mV

## 2019-07-07 ENCOUNTER — Ambulatory Visit (HOSPITAL_COMMUNITY): Payer: BC Managed Care – PPO

## 2019-07-07 ENCOUNTER — Encounter (HOSPITAL_COMMUNITY): Payer: BC Managed Care – PPO | Admitting: Cardiology

## 2019-07-07 NOTE — Progress Notes (Signed)
Remote ICD transmission.   

## 2019-08-20 ENCOUNTER — Telehealth (HOSPITAL_COMMUNITY): Payer: Self-pay | Admitting: Licensed Clinical Social Worker

## 2019-08-20 NOTE — Telephone Encounter (Signed)
Pt called to request more documents be sent to his Disability Determination case worker- release on file for clinic so CSW sent requested documentation for review  Jorge Ny, [facility] Desk#: [phone] Cell#: [phone]

## 2019-08-26 ENCOUNTER — Other Ambulatory Visit: Payer: Self-pay | Admitting: Family Medicine

## 2019-08-26 DIAGNOSIS — E1122 Type 2 diabetes mellitus with diabetic chronic kidney disease: Secondary | ICD-10-CM

## 2019-08-26 DIAGNOSIS — Z794 Long term (current) use of insulin: Secondary | ICD-10-CM

## 2019-09-15 ENCOUNTER — Ambulatory Visit (INDEPENDENT_AMBULATORY_CARE_PROVIDER_SITE_OTHER): Payer: 59 | Admitting: *Deleted

## 2019-09-15 DIAGNOSIS — I5023 Acute on chronic systolic (congestive) heart failure: Secondary | ICD-10-CM | POA: Diagnosis not present

## 2019-09-15 LAB — CUP PACEART REMOTE DEVICE CHECK
Battery Voltage: 2.71 V
Brady Statistic RV Percent Paced: 0.01 %
Date Time Interrogation Session: 20210209121636
HighPow Impedance: 304 Ohm
HighPow Impedance: 36 Ohm
HighPow Impedance: 43 Ohm
Implantable Lead Implant Date: 20130603
Implantable Lead Location: 753860
Implantable Lead Model: 6947
Implantable Pulse Generator Implant Date: 20130603
Lead Channel Impedance Value: 380 Ohm
Lead Channel Pacing Threshold Amplitude: 0.875 V
Lead Channel Pacing Threshold Pulse Width: 0.4 ms
Lead Channel Sensing Intrinsic Amplitude: 7.875 mV
Lead Channel Sensing Intrinsic Amplitude: 7.875 mV
Lead Channel Setting Pacing Amplitude: 2.5 V
Lead Channel Setting Pacing Pulse Width: 0.4 ms
Lead Channel Setting Sensing Sensitivity: 0.3 mV

## 2019-09-16 NOTE — Progress Notes (Signed)
ICD Remote  

## 2019-10-06 ENCOUNTER — Telehealth (HOSPITAL_COMMUNITY): Payer: Self-pay | Admitting: *Deleted

## 2019-10-06 DIAGNOSIS — I5022 Chronic systolic (congestive) heart failure: Secondary | ICD-10-CM

## 2019-10-06 MED ORDER — METOLAZONE 2.5 MG PO TABS: ORAL_TABLET | ORAL | 0 refills | Status: DC

## 2019-10-06 NOTE — Telephone Encounter (Signed)
Left vm for pt to callback 

## 2019-10-06 NOTE — Telephone Encounter (Signed)
Pt called to r/s appt and echo that he missed 12/1. Appointment rescheduled. Patient reported mild shortness of breath with exertion and said his weight is up about 6lbs since he last saw [place] in September. Patient said he can tell his retaining fluid he denies any swelling or chest pain. Pt wants to know if he can take a metolazone and then have lab work this week. He said the metolazone really helped him last time this happened and he only needed one dose. I told patient I would follow up with Dr.McLean and call him back.   Routed to [place]

## 2019-10-06 NOTE — Telephone Encounter (Signed)
Pt aware and agreeable with plan.  

## 2019-10-06 NOTE — Telephone Encounter (Signed)
OK to take metolazone 2.5 x 1 with extra 20 mEq KCl

## 2019-10-07 ENCOUNTER — Other Ambulatory Visit: Payer: Self-pay | Admitting: Family Medicine

## 2019-10-12 ENCOUNTER — Other Ambulatory Visit: Payer: Self-pay

## 2019-10-12 ENCOUNTER — Ambulatory Visit (HOSPITAL_COMMUNITY)
Admission: RE | Admit: 2019-10-12 | Discharge: 2019-10-12 | Disposition: A | Discharge status: Home / Self Care | Payer: 59 | Source: Ambulatory Visit | Attending: Cardiology | Admitting: Cardiology

## 2019-10-12 DIAGNOSIS — I5022 Chronic systolic (congestive) heart failure: Secondary | ICD-10-CM | POA: Diagnosis not present

## 2019-10-12 LAB — BASIC METABOLIC PANEL
Anion gap: 13 (ref 5–15)
BUN: 47 mg/dL — ABNORMAL HIGH (ref 6–20)
CO2: 26 mmol/L (ref 22–32)
Calcium: 10.4 mg/dL — ABNORMAL HIGH (ref 8.9–10.3)
Chloride: 92 mmol/L — ABNORMAL LOW (ref 98–111)
Creatinine, Ser: 1.92 mg/dL — ABNORMAL HIGH (ref 0.61–1.24)
GFR calc Af Amer: 44 mL/min — ABNORMAL LOW (ref >60)
GFR calc non Af Amer: 38 mL/min — ABNORMAL LOW (ref >60)
Glucose, Bld: 477 mg/dL — ABNORMAL HIGH (ref 70–99)
Potassium: 4.1 mmol/L (ref 3.5–5.1)
Sodium: 131 mmol/L — ABNORMAL LOW (ref 135–145)

## 2019-10-15 ENCOUNTER — Telehealth (HOSPITAL_COMMUNITY): Payer: Self-pay | Admitting: Pharmacist

## 2019-10-15 NOTE — Telephone Encounter (Signed)
Patient Advocate Encounter   Received notification from [employer] that prior authorization for Delene Loll is required.   PA submitted on CoverMyMeds Key  [id number] Status is pending   Will continue to follow.  Audry Riles, PharmD, BCPS, BCCP, CPP Heart Failure Clinic Pharmacist [phone]

## 2019-10-16 ENCOUNTER — Other Ambulatory Visit (HOSPITAL_COMMUNITY): Payer: Self-pay

## 2019-10-16 MED ORDER — APIXABAN 5 MG PO TABS: 5.0000 mg | ORAL_TABLET | Freq: Two times a day (BID) | ORAL | 6 refills | Status: DC

## 2019-10-16 NOTE — Telephone Encounter (Signed)
Advanced Heart Failure Patient Advocate Encounter  Prior Authorization for Marcus Johnson has been approved.    Effective dates: 10/15/19 through 10/14/20  Audry Riles, PharmD, BCPS, BCCP, CPP Heart Failure Clinic Pharmacist [phone]

## 2019-10-16 NOTE — Telephone Encounter (Signed)
Patient called to report his co-pay for entresto is now $80 monthly-will need co pay assistance

## 2019-10-19 ENCOUNTER — Telehealth (HOSPITAL_COMMUNITY): Payer: Self-pay | Admitting: Pharmacist

## 2019-10-19 NOTE — Telephone Encounter (Signed)
Patient has Pharmacist, community so he would be eligible for copay card to decrease cost of Entresto to $10.00/month. Left patient VM instructing him on how to obtain a copay card online and activate it.   Audry Riles, PharmD, BCPS, BCCP, CPP Heart Failure Clinic Pharmacist [phone]

## 2019-10-19 NOTE — Telephone Encounter (Signed)
Spoke with patient regarding affordability of Entresto.  Unfortunately he is unable to use the copay card to help with his Entresto copay. Spoke with [employer], and they informed me this was likely because his insurance plan includes a cash card/rebate program. A copay card is unable to be used with those type of plans.   I informed patient of the reason why the claim was rejecting. He expressed understanding. The next step will be to apply for [facility] assistance. I have left the patient portion of the application at the front desk for him to sign. Once completed, I will fax the application in to [employer].   Audry Riles, PharmD, BCPS, BCCP, CPP Heart Failure Clinic Pharmacist [phone]

## 2019-10-20 NOTE — Telephone Encounter (Signed)
Sent in Manufacturer's Assistance application to [employer] for Entresto.    Application pending, will continue to follow.  Rudi Bunyard, PharmD, BCPS, BCCP, CPP Heart Failure Clinic Pharmacist [phone]  

## 2019-10-26 ENCOUNTER — Telehealth: Payer: Self-pay | Admitting: Family Medicine

## 2019-10-26 NOTE — Telephone Encounter (Signed)
Message Routed to PCP  for approval.

## 2019-10-26 NOTE — Telephone Encounter (Signed)
Pt was denied disability claim and pt is asking, if he can have a letter saying, he has health conditions and absolutely cannot work to help with his case. Thanks

## 2019-10-27 NOTE — Telephone Encounter (Signed)
I will be happy to do this, but first he needs to have a disability attorney direct his case

## 2019-10-27 NOTE — Telephone Encounter (Signed)
FYI Patient informed and verbalized understanding. Patient stated that he is working on getting attorney and will call office back for letter.

## 2019-10-29 ENCOUNTER — Ambulatory Visit: Payer: 59 | Attending: Internal Medicine

## 2019-10-29 DIAGNOSIS — Z23 Encounter for immunization: Secondary | ICD-10-CM

## 2019-10-29 NOTE — Progress Notes (Signed)
   [id number]ination [facility]  Name:  Marcus Johnson.    MRN: [id number] DOB: 04-14-1961  10/29/2019  [name] was observed post Covid-19 immunization for 15 minutes without incident. He was provided with Vaccine Information Sheet and instruction to access the V-Safe system.   [name] was instructed to call 911 with any severe reactions post vaccine: Marland Kitchen Difficulty breathing  . Swelling of face and throat  . A fast heartbeat  . A bad rash all over body  . Dizziness and weakness   Immunizations Administered    Name Date Dose VIS Date Route   Pfizer COVID-19 Vaccine 10/29/2019  9:06 AM 0.3 mL 07/17/2019 Intramuscular   Manufacturer: [place]   Lot: [id number]   [place]: [id number]

## 2019-11-09 ENCOUNTER — Other Ambulatory Visit: Payer: Self-pay | Admitting: Family Medicine

## 2019-11-09 DIAGNOSIS — E1122 Type 2 diabetes mellitus with diabetic chronic kidney disease: Secondary | ICD-10-CM

## 2019-11-09 DIAGNOSIS — Z794 Long term (current) use of insulin: Secondary | ICD-10-CM

## 2019-11-10 ENCOUNTER — Telehealth: Payer: Self-pay | Admitting: *Deleted

## 2019-11-10 NOTE — Telephone Encounter (Signed)
Patient called triage line and reports he is needing refill on some cream for broken skin on his groin area. Please call into [facility] in [place] on [address]

## 2019-11-11 NOTE — Telephone Encounter (Signed)
Please advise. I do not see a cream on the patients med list.

## 2019-11-12 MED ORDER — KETOCONAZOLE 2 % EX CREA: 1.0000 "application " | TOPICAL_CREAM | Freq: Two times a day (BID) | CUTANEOUS | 5 refills | Status: DC | PRN

## 2019-11-12 NOTE — Addendum Note (Signed)
Addended by: Alysia Penna A on: 11/12/2019 08:07 AM   Modules accepted: Orders

## 2019-11-12 NOTE — Telephone Encounter (Signed)
Done

## 2019-11-20 NOTE — Telephone Encounter (Signed)
Called [employer] to check on status of patient assistance application. Representative informed me that the application is on hold because proof of income is needed. Left VM for patient to see if he can provide me with the documentation (3 months paystub/bank statements, W2, 1099, or SSI).   Audry Riles, PharmD, BCPS, BCCP, CPP Heart Failure Clinic Pharmacist [phone]

## 2019-11-20 NOTE — Telephone Encounter (Signed)
Received income documentation from Mr. Kinslow. I faxed the documents in to [employer]. They will begin to re-process his patient assistance application for Entresto.   Audry Riles, PharmD, BCPS, BCCP, CPP Heart Failure Clinic Pharmacist [phone]

## 2019-11-23 ENCOUNTER — Ambulatory Visit: Payer: 59 | Attending: Internal Medicine

## 2019-11-23 DIAGNOSIS — Z23 Encounter for immunization: Secondary | ICD-10-CM

## 2019-11-23 NOTE — Progress Notes (Signed)
2  Cov[facility]-19 Vaccination Clinic  Name:  Marcus Johnson.    MRN: [id number] DOB: September 27, 1960  11/23/2019  Mr. Eisenberger was observed post Covid-19 immunization for 30 minutes based on pre-vaccination screening without incident. He was provided with Vaccine Information Sheet and instruction to access the V-Safe system.   Mr. Adriano was instructed to call 911 with any severe reactions post vaccine: Marland Kitchen Difficulty breathing  . Swelling of face and throat  . A fast heartbeat  . A bad rash all over body  . Dizziness and weakness   Immunizations Administered    Name Date Dose VIS Date Route   Pfizer COVID-19 Vaccine 11/23/2019  8:37 AM 0.3 mL 09/30/2018 Intramuscular   Manufacturer: [place]   Lot: [id number]   [place]: [id number]

## 2019-11-25 ENCOUNTER — Ambulatory Visit (HOSPITAL_BASED_OUTPATIENT_CLINIC_OR_DEPARTMENT_OTHER)
Admission: RE | Admit: 2019-11-25 | Discharge: 2019-11-25 | Disposition: A | Discharge status: Home / Self Care | Payer: 59 | Source: Ambulatory Visit | Attending: Cardiology | Admitting: Cardiology

## 2019-11-25 ENCOUNTER — Telehealth (HOSPITAL_COMMUNITY): Payer: Self-pay | Admitting: *Deleted

## 2019-11-25 ENCOUNTER — Other Ambulatory Visit: Payer: Self-pay

## 2019-11-25 ENCOUNTER — Telehealth (HOSPITAL_COMMUNITY): Payer: Self-pay | Admitting: Pharmacist

## 2019-11-25 ENCOUNTER — Encounter (HOSPITAL_COMMUNITY): Payer: Self-pay | Admitting: Cardiology

## 2019-11-25 ENCOUNTER — Ambulatory Visit (HOSPITAL_COMMUNITY)
Admission: RE | Admit: 2019-11-25 | Discharge: 2019-11-25 | Disposition: A | Discharge status: Home / Self Care | Payer: 59 | Source: Ambulatory Visit | Attending: Cardiology | Admitting: Cardiology

## 2019-11-25 VITALS — BP 124/60 | HR 78 | Wt 213.8 lb

## 2019-11-25 DIAGNOSIS — I480 Paroxysmal atrial fibrillation: Secondary | ICD-10-CM | POA: Diagnosis not present

## 2019-11-25 DIAGNOSIS — Z8249 Family history of ischemic heart disease and other diseases of the circulatory system: Secondary | ICD-10-CM | POA: Diagnosis not present

## 2019-11-25 DIAGNOSIS — Z794 Long term (current) use of insulin: Secondary | ICD-10-CM | POA: Diagnosis not present

## 2019-11-25 DIAGNOSIS — E1122 Type 2 diabetes mellitus with diabetic chronic kidney disease: Secondary | ICD-10-CM | POA: Insufficient documentation

## 2019-11-25 DIAGNOSIS — Z7901 Long term (current) use of anticoagulants: Secondary | ICD-10-CM | POA: Insufficient documentation

## 2019-11-25 DIAGNOSIS — I5042 Chronic combined systolic (congestive) and diastolic (congestive) heart failure: Secondary | ICD-10-CM

## 2019-11-25 DIAGNOSIS — Z951 Presence of aortocoronary bypass graft: Secondary | ICD-10-CM | POA: Insufficient documentation

## 2019-11-25 DIAGNOSIS — N183 Chronic kidney disease, stage 3 unspecified: Secondary | ICD-10-CM | POA: Insufficient documentation

## 2019-11-25 DIAGNOSIS — I2510 Atherosclerotic heart disease of native coronary artery without angina pectoris: Secondary | ICD-10-CM

## 2019-11-25 DIAGNOSIS — I4892 Unspecified atrial flutter: Secondary | ICD-10-CM | POA: Diagnosis not present

## 2019-11-25 DIAGNOSIS — R42 Dizziness and giddiness: Secondary | ICD-10-CM | POA: Insufficient documentation

## 2019-11-25 DIAGNOSIS — Z79899 Other long term (current) drug therapy: Secondary | ICD-10-CM | POA: Diagnosis not present

## 2019-11-25 DIAGNOSIS — I130 Hypertensive heart and chronic kidney disease with heart failure and stage 1 through stage 4 chronic kidney disease, or unspecified chronic kidney disease: Secondary | ICD-10-CM | POA: Diagnosis not present

## 2019-11-25 LAB — HEMOGLOBIN A1C
Hgb A1c MFr Bld: 15.7 % — ABNORMAL HIGH (ref 4.8–5.6)
Mean Plasma Glucose: 403.89 mg/dL

## 2019-11-25 LAB — CBC
HCT: 42.4 % (ref 39.0–52.0)
Hemoglobin: 13.3 g/dL (ref 13.0–17.0)
MCH: 26.5 pg (ref 26.0–34.0)
MCHC: 31.4 g/dL (ref 30.0–36.0)
MCV: 84.6 fL (ref 80.0–100.0)
Platelets: 206 10*3/uL (ref 150–400)
RBC: 5.01 MIL/uL (ref 4.22–5.81)
RDW: 13.9 % (ref 11.5–15.5)
WBC: 6.5 10*3/uL (ref 4.0–10.5)
nRBC: 0 % (ref 0.0–0.2)

## 2019-11-25 LAB — BASIC METABOLIC PANEL
Anion gap: 13 (ref 5–15)
BUN: 39 mg/dL — ABNORMAL HIGH (ref 6–20)
CO2: 23 mmol/L (ref 22–32)
Calcium: 9.9 mg/dL (ref 8.9–10.3)
Chloride: 93 mmol/L — ABNORMAL LOW (ref 98–111)
Creatinine, Ser: 1.5 mg/dL — ABNORMAL HIGH (ref 0.61–1.24)
GFR calc Af Amer: 58 mL/min — ABNORMAL LOW (ref >60)
GFR calc non Af Amer: 50 mL/min — ABNORMAL LOW (ref >60)
Glucose, Bld: 628 mg/dL (ref 70–99)
Potassium: 4.8 mmol/L (ref 3.5–5.1)
Sodium: 129 mmol/L — ABNORMAL LOW (ref 135–145)

## 2019-11-25 LAB — DIGOXIN LEVEL: Digoxin Level: <0.2 ng/mL — ABNORMAL LOW (ref 0.8–2.0)

## 2019-11-25 MED ORDER — FARXIGA 10 MG PO TABS: 10.0000 mg | ORAL_TABLET | Freq: Every day | ORAL | 6 refills | Status: DC

## 2019-11-25 MED ORDER — TORSEMIDE 20 MG PO TABS: ORAL_TABLET | ORAL | 6 refills | Status: DC

## 2019-11-25 NOTE — Patient Instructions (Signed)
Start Farxiga 10 mg daily  Increase Torsemide to 80 mg Twice daily FOR 3 DAYS ONLY, then take  Torsemide 80 mg (4 tabs) in AM and 60 mg (3 tabs) in PM  Labs done today, we will notify you of abnormal results  Labs needed again in 10 days  Your physician has recommended that you have a cardiopulmonary stress test (CPX). CPX testing is a non-invasive measurement of heart and lung function. It replaces a traditional treadmill stress test. This type of test provides a tremendous amount of information that relates not only to your present condition but also for future outcomes. This test combines measurements of you ventilation, respiratory gas exchange in the lungs, electrocardiogram (EKG), blood pressure and physical response before, during, and following an exercise protocol.  Your physician recommends that you schedule a follow-up appointment in: 1 month  If you have any questions or concerns before your next appointment please send [place] a message through [place] or call our office at [phone].  At the [facility], you and your health needs are our priority. As part of our continuing mission to provide you with exceptional heart care, we have created designated Provider Care Teams. These Care Teams include your primary Cardiologist (physician) and Advanced Practice Providers (APPs- Physician Assistants and Nurse Practitioners) who all work together to provide you with the care you need, when you need it.   You may see any of the following providers on your designated Care Team at your next follow up: Marland Kitchen Dr Glori Bickers . Dr Loralie Champagne . Darrick Grinder, NP . Lyda Jester, PA . Audry Riles, PharmD   Please be sure to bring in all your medications bottles to every appointment.

## 2019-11-25 NOTE — Telephone Encounter (Signed)
Received a critical glucose from the lab glucose 628. I called pt to see if he was symptomatic and if he had taken his medications no answer/ left vm requesting return call

## 2019-11-25 NOTE — Progress Notes (Signed)
Patient ID: Marcus Johnson., male   DOB: 1961-05-21, 59 y.o.   MRN: [id number] PCP: Dr. Sarajane Jews Cardiology: Dr. Aundra Dubin  HPI: Marcus Johnson is a 59 y.o. male with h/o DM2, CAD s/p CABG 7989, systolic HF due to ischemic cardiomyopathy with EF 20-25% (echo 12/15), DM2 and CKD. He is s/p Medtronic single chamber ICD.  Admitted in 12/15 due to ADHF. Required short course milrinone for diuresis. Diuresed 30 pounds.   CPX 2/16 showed severely reduced functional capacity.  There was a significant disconnect between his symptoms and the CPX.  RHC in 6/16 showed fairly normal filling pressures and low but not markedly low cardiac output.   CPX (4/19) was submaximal but suggested severe limitation from HF.   He was admitted in 6/19 with marked volume overload and dyspnea.  Echo in 6/19 showed EF 20% with severe global hypokinesis.  LHC/RHC showed severe native vessel CAD with patent LIMA-D, SVG-OM, and SVG-PLV; cardiac index low.  He was started on milrinone and diuresed.  We discussed LVAD extensively in light of low output, creatinine that is trending up and concern for decompensation of RV over time. He was adamant that he did not want to pursue LVAD workup yet and did not want a referral for transplant evaluation. Milrinone was weaned off and he was discharged home.   Admitted 08/01/18-08/07/18 with volume overload and left foot wound. Diuresed 38 lbs with IV lasix and then transitioned back to torsemide 100 mg BID. ID was consulted with concerns for left foot osteomyelitis noted on CT scan. He got IV ancef q8 hours x 42 days. [facility] provided teaching and supplies for home antibiotics. Course complicated by AKI. DC weight 200 lbs.   He went into atrial flutter and had DCCV to NSR in 1/20.   Echo was done today and reviewed, EF 25% with apical akinesis and diffuse hypokinesis relatively preserved in lateral wall, mildly decreased RV systolic function, PASP 41 mmHg.   He returns for followup of CHF.  Weight  is up 4 lbs.  SBP 120s when he checks at home but he has episodes of lightheadedness/dizziness, often after he takes his morning meds.  Not always standing, sometimes sitting when this happens.  He does not feel palpitations.  He is very limited by the lightheadedness.  He is short of breath walking up stairs, short of breath walking 1/4 mile, short of breath with hills.  No chest pain.  He is out of work and trying to get disability.  No orthopnea/PND.      Medtronic ICD interrogation: No VT.  Impedance trending down.    Studies: Echo (12/15): EF 20-25% RV moderately HK CPX (2/16): RER 1.10, peak VO2 8.6, VE/VCO2 slope 43.7 => severely reduced functional capacity.  RHC (6/16): mean RA 8, PA 48/19 mean 30, mean PCWP 14, CI 2.03 Fick/2.28 Thermo, PVR 3.6.  Echo (5/17): EF 25%, mild LV dilation, moderate diastolic dysfunction, normal RV size with mild to moderate systolic dysfunction, mild mitral regurgitation CPX (4/19): Peak VO2 7, VE/VCO2 slope 41, RER 1.02 => submaximal but still suggestive of severe HF limitation.  Echo (6/19): EF 20%, diffuse severe HK, moderately decreased RV systolic function, moderate TR.  LHC/RHC (6/19): Severe native vessel CAD with patent LIMA-D, SVG-OM, and SVG-PLV; mean RA 15, PA 54/24 mean 37, mean PCWP 32, CI 1.86 Fick/1.64 thermo. PAPi 2, PVR 1.25 WU.  DC-CV 09/03/18 -->NSR  Echo (4/21): EF 25% with apical akinesis and diffuse hypokinesis relatively preserved in lateral wall,  mildly decreased RV systolic function, PASP 41 mmHg.  Labs:  8/15 LDL 88 12/15 K+ 4.2 creatinine 1.09  1/16 K 4.3, creatinine 1.34 4/16 K 4.5, creatinine 1.19 3/17 K 3.8, creatinine 1.22, HCT 35.3, BNP 1597 5/17 K 4.1, creatinine 1.25 6/18 LDL 138, K 4.4, creatinine 1.5 6/19 hgb 13.5, K 4.9, creatinine 1.53 => 1.5 => 1.4, BNP 992 08/07/2018: K 4.3 Creatinine 1.86  6/20: LDL 98, TSH normal, Hgb 13.5, K 4.3, creatinine 2.05 7/20: K 4.2, creatinine 2.56 => 2.03 8/20: LDL 34, HDL 22 3/21: K  4.1, creatinine 1.92  Review of systems complete and found to be negative unless listed in HPI.   SH:  Social History   Socioeconomic History  . Marital status: Divorced    Spouse name: Not on file  . Number of children: Not on file  . Years of education: Not on file  . Highest education level: Not on file  Occupational History  . Not on file  Tobacco Use  . Smoking status: Never Smoker  . Smokeless tobacco: Never Used  Substance and Sexual Activity  . Alcohol use: No    Alcohol/week: 0.0 standard drinks  . Drug use: No  . Sexual activity: Not on file  Other Topics Concern  . Not on file  Social History Narrative   Lives in [place] with his son and his family.  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   Social Determinants of Health   Financial Resource Strain:   . Difficulty of Paying Living Expenses:   Food Insecurity:   . Worried About Charity fundraiser in the Last Year:   . Arboriculturist in the Last Year:   Transportation Needs:   . Film/video editor (Medical):   Marland Kitchen Lack of Transportation (Non-Medical):   Physical Activity:   . Days of Exercise per Week:   . Minutes of Exercise per Session:   Stress:   . Feeling of Stress :   Social Connections:   . Frequency of Communication with Friends and Family:   . Frequency of Social Gatherings with Friends and Family:   . Attends Religious Services:   . Active Member of Clubs or Organizations:   . Attends Archivist Meetings:   Marland Kitchen Marital Status:   Intimate Partner Violence:   . Fear of Current or Ex-Partner:   . Emotionally Abused:   Marland Kitchen Physically Abused:   . Sexually Abused:     FH:  Family History  Problem Relation Age of Onset  . Diabetes Father        died in his 46's  . Hypertension Mother        died in her 84's - had a ppm.  . Amblyopia Neg Hx   . Blindness Neg Hx   . Cataracts Neg Hx   . Glaucoma Neg Hx   . Macular degeneration Neg Hx   . Retinal  detachment Neg Hx   . Strabismus Neg Hx   . Retinitis pigmentosa Neg Hx     Past Medical History:  Diagnosis Date  . AICD (automatic cardioverter/defibrillator) present    Single-chamber  implantable cardiac defibrillator - Medtronic  . CHF (congestive heart failure) ([place])   . Chronic systolic heart failure ([place])    a. Echo 5/13: Mild LVE, mild LVH, EF 10%, anteroseptal, lateral, apical AK, mild MR, mild LAE, moderately reduced RVSF, mild RAE, PASP 60;  b. 07/2014 Echo: EF 20-2%, diff HK,  AKI of antsep/apical/mid-apicalinferior, mod reduced RV.  Marland Kitchen Coronary artery disease    a. s/p CABG 2002 b. LHC 5/13:  dLM 80%, LAD subtotally occluded, pCFX occluded, pRCA 50%, mid? Occlusion with high take off of the PDA with 70% multiple lesions-not bypassed and supplies collaterals to LAD, LIMA-IM/ramus ok, S-OM ok, S-PLA branches ok. Medical therapy was recommended  . Gout    "on daily RX" (01/08/2018)  . Hypertension   . Ischemic cardiomyopathy    a. Echo 5/13: Mild LVE, mild LVH, EF 10%, anteroseptal, lateral, apical AK, mild MR, mild LAE, moderately reduced RVSF, mild RAE, PASP 60;  b. 01/2012 s/p MDT [id number] Protecta XT VR AICD;  c. 07/2014 Echo: EF 20-2%, diff HK, AKI of antsep/apical/mid-apicalinferior, mod reduced RV.  Marland Kitchen MRSA (methicillin resistant Staphylococcus aureus)    Status post right foot plantar deep infection with MRSA status post  I&D 10/2008  . Myocardial infarction [facility])    "was told I'd had several before heart OR in 2002" (01/08/2018)  . Noncompliance   . Peripheral neuropathy   . Syncope   . Type II diabetes mellitus ([place])    requiring insulin     Current Outpatient Medications  Medication Sig Dispense Refill  . allopurinol (ZYLOPRIM) 300 MG tablet Take 600 mg by mouth daily as needed (gout).    Marland Kitchen apixaban (ELIQUIS) 5 MG TABS tablet Take 1 tablet (5 mg total) by mouth 2 (two) times daily. 60 tablet 6  . BAYER MICROLET LANCETS lancets Use as instructed 100 each 11  . carvedilol  (COREG) 12.5 MG tablet Take 1 tablet (12.5 mg total) by mouth 2 (two) times daily. 30 tablet 11  . digoxin (LANOXIN) 0.125 MG tablet TAKE 1 TABLET BY MOUTH EVERY DAY 30 tablet 3  . ENTRESTO 49-51 MG TAKE 1 TABLET BY MOUTH TWICE A DAY 60 tablet 6  . insulin aspart (NOVOLOG FLEXPEN) 100 UNIT/ML FlexPen INJECT 5 UNITS INTO THE SKIN 3 TIMES DAILY WITH MEALS 15 mL 11  . Insulin Pen Needle (BD PEN NEEDLE NANO U/F) 32G X 4 MM MISC USE 4 TIMES A DAY 100 each 11  . ketoconazole (NIZORAL) 2 % cream Apply 1 application topically 2 (two) times daily as needed for irritation. 30 g 5  . LANTUS SOLOSTAR 100 UNIT/ML Solostar Pen INJECT 50 UNITS INTO THE SKIN AT BEDTIME. 15 mL 1  . potassium chloride SA (K-DUR,KLOR-CON) 20 MEQ tablet Take 1 tablet (20 mEq total) by mouth daily. Take 1 tablet extra on days you take metolazone. 90 tablet 3  . rosuvastatin (CRESTOR) 40 MG tablet TAKE 1 TABLET BY MOUTH EVERY DAY 90 tablet 3  . spironolactone (ALDACTONE) 25 MG tablet TAKE 1 TABLET BY MOUTH EVERY DAY 90 tablet 0  . torsemide (DEMADEX) 20 MG tablet Take 4 tablets (80 mg total) by mouth every morning AND 3 tablets (60 mg total) every evening. 180 tablet 6  . dapagliflozin propanediol (FARXIGA) 10 MG TABS tablet Take 10 mg by mouth daily before breakfast. 30 tablet 6   Current Facility-Administered Medications  Medication Dose Route Frequency Provider Last Rate Last Admin  . Bevacizumab (AVASTIN) SOLN 1.25 mg  1.25 mg Intravitreal  [name], MD   1.25 mg at 08/27/18 2139  . Bevacizumab (AVASTIN) SOLN 1.25 mg  1.25 mg Intravitreal  [name], MD   1.25 mg at 09/24/18 1722  . Bevacizumab (AVASTIN) SOLN 1.25 mg  1.25 mg Intravitreal  [name], MD   1.25 mg at 09/24/18 1722  Vitals:   11/25/19 0909  BP: 124/60  Pulse: 78  SpO2: 99%  Weight: 97 kg (213 lb 12.8 oz)   Wt Readings from Last 3 Encounters:  11/25/19 97 kg (213 lb 12.8 oz)  05/05/19 94.8 kg (209 lb)  02/27/19 93.1 kg (205 lb 3.2 oz)     PHYSICAL EXAM: General: NAD Neck: JVP 8-9 cm, no thyromegaly or thyroid nodule.  Lungs: Clear to auscultation bilaterally with normal respiratory effort. CV: Nondisplaced PMI.  Heart regular S1/S2, no S3/S4, no murmur.  1+ ankle edema.  No carotid bruit.  Normal pedal pulses.  Abdomen: Soft, nontender, no hepatosplenomegaly, no distention.  Skin: Intact without lesions or rashes.  Neurologic: Alert and oriented x 3.  Psych: Normal affect. Extremities: No clubbing or cyanosis.  HEENT: Normal.   ASSESSMENT & PLAN: 1.Chronic systolic HF:ICM.HasMedtronic ICD. Narrow QRS, not CRT candidate. Has required milrinone multiple times in the past for low output HF. Offered advanced therapies in past but declined. Echo 6/19 with EF 20% and moderately decreased RV systolic function.CPX 4/19 with severe functional limitation due to [facility] 6/19 showed low output and coronary angiography showed no interventional targets.Echo 08/04/18 with EF 20-25%, mild LVH, diffuse HK, akinesis of anteroseptal and anterior myocardium, grade 2 DD, trivial AI, LA mod dilated, RV moderately dilated, RA mildly dilated, moderate TR, PA peak pressure 49 mm Hg.Echo today reviewed and showed EF 25%, mildly decreased RV systolic function. I suspect that a lot of his symptomatology is due to low output HF, NYHA class III symptoms with volume overload on exam.  History of cardiorenal syndrome/diabetic nephropathy, last creatinine 1.9.    - Increase torsemide to 80 mg bid x 3 days then 80 qam/60 qpm after that. BMET today and again in 10 days.  - Continue Entresto49/51 mg BID, no BP room to increase.  - Continuespironolactone25 mg daily, take before bed to limit effect on BP during the day.  -Intolerant ivabradine due to nausea. - Continue coreg to 12.5 mg BID, no BP room to increase and would also avoid increase with low output HF history.  - Continue digoxin0.125 mg daily. Check digoxin level today.  - I am  going to add dapafliglozin 10 mg daily.  Hopefully this will have minimal effect on BP.  - Has been very resistant to LVAD/transplant evaluation, both offered in past. We discussed LVAD again today.Increasing creatinine and gradually progressive symptomatic worsening is concerning.  I worry that we will miss the window for advanced therapies.  I will arrange for CPX to reassess functional capacity.  - He was not interesed in barostimulation study.  - QRS not wide, not CRT candidate.  2. CAD s/p CABG2002:Last cath in 6/19 with patent grafts. No chest pain.  - No ASA given apixaban use.  - Good lipids in 8/20 on Crestor 40 mg daily.  3. CKD Stage 3:Creatinine up to 1.9 when checked most recently. I suspect that this is a combination of cardiorenal syndrome and diabetic nephropathy.  - BMET  - I recommended nephrology referral but he does not want this yet due to finances.   4. DM: History of very poor control.  Followup with PCP.  As above, adding dapagliflozin.  5. Atrial flutter: s/p DC-CV 08/21/18.  He is in NSR today on echo, denies palpitations.  - Continue Eliquis.  6. Lightheadedness: He get debilitating spells of lightheadedness.  This may be orthostatic, though SBP seems to be in the 120s whenever he checks at home.  He has  a single lead ICD so cannot rule out atrial arrhythmias.  No syncope/presyncope.  - I will place a Zio patch x 1 week to monitor for arrhythmias.   I will have the social worker see him to help with his financial issues.  Followup in 1 month.    Loralie Champagne, MD 11/25/2019

## 2019-11-25 NOTE — Telephone Encounter (Signed)
Advanced Heart Failure Patient Advocate Encounter  Prior Authorization for [place] has been approved.    Effective dates: 11/25/19 through 11/24/20  Audry Riles, PharmD, BCPS, BCCP, CPP Heart Failure Clinic Pharmacist [phone]

## 2019-11-25 NOTE — Telephone Encounter (Signed)
Pt aware of results. Pt stated he would call his PCP right now. I forwarded labs to PCP .

## 2019-11-25 NOTE — Progress Notes (Signed)
  Echocardiogram 2D Echocardiogram has been performed.  Marcus Johnson 11/25/2019, 8:37 AM

## 2019-11-26 ENCOUNTER — Telehealth (HOSPITAL_COMMUNITY): Payer: Self-pay | Admitting: Licensed Clinical Social Worker

## 2019-11-26 ENCOUNTER — Telehealth: Payer: Self-pay | Admitting: Family Medicine

## 2019-11-26 MED ORDER — BLOOD GLUCOSE MONITOR KIT: PACK | 0 refills | Status: DC

## 2019-11-26 NOTE — Telephone Encounter (Signed)
Rx has been faxed in. Patient is aware.

## 2019-11-26 NOTE — Telephone Encounter (Signed)
Jessica from [place] needs to know, How many times he needs to test his blood sugar per day? For insurance purposes.   Janett Billow can be reached at [phone]

## 2019-11-26 NOTE — Telephone Encounter (Signed)
Please advise. How often should the patient test?  Dr. Sarajane Jews is out off the office

## 2019-11-26 NOTE — Telephone Encounter (Signed)
Advanced Heart Failure Patient Advocate Encounter   Patient was approved to receive Entresto from [employer]  Patient ID: [id number] Effective dates: 11/26/19 through 08/05/20  Audry Riles, PharmD, BCPS, BCCP, CPP Heart Failure Clinic Pharmacist [phone]

## 2019-11-26 NOTE — Telephone Encounter (Signed)
CSW consulted to speak with pt regarding medication cost concerns.  CSW spoke with pt who is concerned about his new medication that MD just prescribed, Farxiga, and its copay costs.  Pt is on lots of medications and the cost adds up so he is worried about adding another one and being unable to afford his medications.  CSW got copay card and called in information to pharmacy- they were able to verify it would be $0 copay for pt- CSW updated pt regarding $0- no further concerns other than Entresto at this time which is being worked on by clinic pharmacist  Jorge Ny, [facility] Desk#: [phone] Cell#: [phone]

## 2019-11-26 NOTE — Telephone Encounter (Signed)
Pt is requesting a replacement glucose monitor, his is no longer working. I did advise that Dr. Sarajane Jews is out of the office until Monday.

## 2019-12-01 NOTE — Telephone Encounter (Signed)
[facility] called again to find out "How many times the pt should test his blood sugar per day" They need a call back by tomorrow.  Phone: [phone]

## 2019-12-02 MED ORDER — ONETOUCH ULTRASOFT LANCETS MISC: 3 refills | Status: DC

## 2019-12-02 MED ORDER — GLUCOSE BLOOD VI STRP: ORAL_STRIP | 3 refills | Status: DC

## 2019-12-02 NOTE — Telephone Encounter (Signed)
He should test once a day

## 2019-12-02 NOTE — Telephone Encounter (Signed)
Please advise. How often should the patient test?

## 2019-12-02 NOTE — Telephone Encounter (Signed)
Rx has been sent in. Patient is aware. 

## 2019-12-02 NOTE — Telephone Encounter (Signed)
Pt stated he is needing the test strips and lancets for his glucose machine but in order to get it Dr. Sarajane Jews will need to call the pharmacy back to inform them on "How many times the pt should test his blood sugar per day"

## 2019-12-02 NOTE — Addendum Note (Signed)
Addended by: Rebecca Eaton on: 12/02/2019 03:43 PM   Modules accepted: Orders

## 2019-12-04 ENCOUNTER — Other Ambulatory Visit: Payer: Self-pay

## 2019-12-04 ENCOUNTER — Ambulatory Visit (HOSPITAL_COMMUNITY)
Admission: RE | Admit: 2019-12-04 | Discharge: 2019-12-04 | Disposition: A | Discharge status: Home / Self Care | Payer: 59 | Source: Ambulatory Visit | Attending: Cardiology | Admitting: Cardiology

## 2019-12-04 DIAGNOSIS — I5042 Chronic combined systolic (congestive) and diastolic (congestive) heart failure: Secondary | ICD-10-CM | POA: Diagnosis present

## 2019-12-04 LAB — BASIC METABOLIC PANEL
Anion gap: 13 (ref 5–15)
BUN: 54 mg/dL — ABNORMAL HIGH (ref 6–20)
CO2: 23 mmol/L (ref 22–32)
Calcium: 9.4 mg/dL (ref 8.9–10.3)
Chloride: 98 mmol/L (ref 98–111)
Creatinine, Ser: 1.96 mg/dL — ABNORMAL HIGH (ref 0.61–1.24)
GFR calc Af Amer: 42 mL/min — ABNORMAL LOW (ref >60)
GFR calc non Af Amer: 36 mL/min — ABNORMAL LOW (ref >60)
Glucose, Bld: 392 mg/dL — ABNORMAL HIGH (ref 70–99)
Potassium: 4 mmol/L (ref 3.5–5.1)
Sodium: 134 mmol/L — ABNORMAL LOW (ref 135–145)

## 2019-12-07 ENCOUNTER — Telehealth (HOSPITAL_COMMUNITY): Payer: Self-pay

## 2019-12-07 NOTE — Telephone Encounter (Signed)
-----   Message from Larey Dresser, MD sent at 12/04/2019  3:59 PM EDT ----- No changes for now, glucose still very high.  Needs to see PCP about very poor diabetes control. Offer referral to endocrinologist, Dr. Cruzita Lederer.

## 2019-12-07 NOTE — Telephone Encounter (Signed)
Spoke with patient, aware of lab results. Advised of recommendations for referral to Endocrine. Pt declines presently as he cannot meet the financial obligations. He said he wasn't able to monitor blood sugar as he did not have supplies. He said numbers are improving and he will manage it for now.  Advised of possible health effects associated, patient understands. Advised to see primary for assistance when need and to contact office when he wants referral to be placed.

## 2019-12-11 ENCOUNTER — Other Ambulatory Visit (HOSPITAL_COMMUNITY)
Admission: RE | Admit: 2019-12-11 | Discharge: 2019-12-11 | Disposition: A | Discharge status: Home / Self Care | Payer: 59 | Source: Ambulatory Visit | Attending: Cardiology | Admitting: Cardiology

## 2019-12-11 DIAGNOSIS — Z20822 Contact with and (suspected) exposure to covid-19: Secondary | ICD-10-CM | POA: Insufficient documentation

## 2019-12-11 DIAGNOSIS — Z01812 Encounter for preprocedural laboratory examination: Secondary | ICD-10-CM | POA: Insufficient documentation

## 2019-12-11 LAB — SARS CORONAVIRUS 2 (TAT 6-24 HRS): SARS Coronavirus 2: NEGATIVE

## 2019-12-14 ENCOUNTER — Other Ambulatory Visit: Payer: Self-pay

## 2019-12-14 ENCOUNTER — Ambulatory Visit (HOSPITAL_COMMUNITY): Payer: 59 | Attending: Internal Medicine

## 2019-12-14 ENCOUNTER — Other Ambulatory Visit (HOSPITAL_COMMUNITY): Payer: Self-pay | Admitting: *Deleted

## 2019-12-14 DIAGNOSIS — I5042 Chronic combined systolic (congestive) and diastolic (congestive) heart failure: Secondary | ICD-10-CM | POA: Insufficient documentation

## 2019-12-15 ENCOUNTER — Ambulatory Visit (INDEPENDENT_AMBULATORY_CARE_PROVIDER_SITE_OTHER): Payer: 59 | Admitting: *Deleted

## 2019-12-15 ENCOUNTER — Other Ambulatory Visit: Payer: Self-pay | Admitting: Family Medicine

## 2019-12-15 DIAGNOSIS — I5042 Chronic combined systolic (congestive) and diastolic (congestive) heart failure: Secondary | ICD-10-CM

## 2019-12-15 DIAGNOSIS — I255 Ischemic cardiomyopathy: Secondary | ICD-10-CM

## 2019-12-15 LAB — CUP PACEART REMOTE DEVICE CHECK
Battery Voltage: 2.67 V
Brady Statistic RV Percent Paced: 0.01 %
Date Time Interrogation Session: 20210511101849
HighPow Impedance: 323 Ohm
HighPow Impedance: 37 Ohm
HighPow Impedance: 48 Ohm
Implantable Lead Implant Date: 20130603
Implantable Lead Location: 753860
Implantable Lead Model: 6947
Implantable Pulse Generator Implant Date: 20130603
Lead Channel Impedance Value: 399 Ohm
Lead Channel Pacing Threshold Amplitude: 0.875 V
Lead Channel Pacing Threshold Pulse Width: 0.4 ms
Lead Channel Sensing Intrinsic Amplitude: 8.875 mV
Lead Channel Sensing Intrinsic Amplitude: 8.875 mV
Lead Channel Setting Pacing Amplitude: 2.5 V
Lead Channel Setting Pacing Pulse Width: 0.4 ms
Lead Channel Setting Sensing Sensitivity: 0.3 mV

## 2019-12-16 NOTE — Progress Notes (Signed)
Remote ICD transmission.   

## 2019-12-29 ENCOUNTER — Other Ambulatory Visit: Payer: Self-pay | Admitting: Family Medicine

## 2020-01-01 ENCOUNTER — Telehealth (HOSPITAL_COMMUNITY): Payer: Self-pay | Admitting: Licensed Clinical Social Worker

## 2020-01-01 ENCOUNTER — Ambulatory Visit (HOSPITAL_COMMUNITY)
Admission: RE | Admit: 2020-01-01 | Discharge: 2020-01-01 | Disposition: A | Discharge status: Home / Self Care | Payer: 59 | Source: Ambulatory Visit | Attending: Cardiology | Admitting: Cardiology

## 2020-01-01 ENCOUNTER — Encounter (HOSPITAL_COMMUNITY): Payer: Self-pay | Admitting: Cardiology

## 2020-01-01 ENCOUNTER — Other Ambulatory Visit: Payer: Self-pay

## 2020-01-01 VITALS — BP 94/52 | HR 79 | Ht 70.5 in | Wt 216.2 lb

## 2020-01-01 DIAGNOSIS — Z7901 Long term (current) use of anticoagulants: Secondary | ICD-10-CM | POA: Insufficient documentation

## 2020-01-01 DIAGNOSIS — I255 Ischemic cardiomyopathy: Secondary | ICD-10-CM | POA: Insufficient documentation

## 2020-01-01 DIAGNOSIS — E1122 Type 2 diabetes mellitus with diabetic chronic kidney disease: Secondary | ICD-10-CM | POA: Insufficient documentation

## 2020-01-01 DIAGNOSIS — Z951 Presence of aortocoronary bypass graft: Secondary | ICD-10-CM | POA: Insufficient documentation

## 2020-01-01 DIAGNOSIS — Z8614 Personal history of Methicillin resistant Staphylococcus aureus infection: Secondary | ICD-10-CM | POA: Insufficient documentation

## 2020-01-01 DIAGNOSIS — I493 Ventricular premature depolarization: Secondary | ICD-10-CM

## 2020-01-01 DIAGNOSIS — I252 Old myocardial infarction: Secondary | ICD-10-CM | POA: Diagnosis not present

## 2020-01-01 DIAGNOSIS — M109 Gout, unspecified: Secondary | ICD-10-CM | POA: Insufficient documentation

## 2020-01-01 DIAGNOSIS — R42 Dizziness and giddiness: Secondary | ICD-10-CM | POA: Insufficient documentation

## 2020-01-01 DIAGNOSIS — I480 Paroxysmal atrial fibrillation: Secondary | ICD-10-CM | POA: Diagnosis not present

## 2020-01-01 DIAGNOSIS — I130 Hypertensive heart and chronic kidney disease with heart failure and stage 1 through stage 4 chronic kidney disease, or unspecified chronic kidney disease: Secondary | ICD-10-CM | POA: Insufficient documentation

## 2020-01-01 DIAGNOSIS — E1142 Type 2 diabetes mellitus with diabetic polyneuropathy: Secondary | ICD-10-CM | POA: Insufficient documentation

## 2020-01-01 DIAGNOSIS — I5022 Chronic systolic (congestive) heart failure: Secondary | ICD-10-CM | POA: Insufficient documentation

## 2020-01-01 DIAGNOSIS — Z79899 Other long term (current) drug therapy: Secondary | ICD-10-CM | POA: Insufficient documentation

## 2020-01-01 DIAGNOSIS — I5042 Chronic combined systolic (congestive) and diastolic (congestive) heart failure: Secondary | ICD-10-CM | POA: Diagnosis not present

## 2020-01-01 DIAGNOSIS — I4892 Unspecified atrial flutter: Secondary | ICD-10-CM | POA: Diagnosis not present

## 2020-01-01 DIAGNOSIS — Z95810 Presence of automatic (implantable) cardiac defibrillator: Secondary | ICD-10-CM | POA: Insufficient documentation

## 2020-01-01 DIAGNOSIS — Z833 Family history of diabetes mellitus: Secondary | ICD-10-CM | POA: Diagnosis not present

## 2020-01-01 DIAGNOSIS — Z8249 Family history of ischemic heart disease and other diseases of the circulatory system: Secondary | ICD-10-CM | POA: Insufficient documentation

## 2020-01-01 DIAGNOSIS — N1831 Chronic kidney disease, stage 3a: Secondary | ICD-10-CM

## 2020-01-01 DIAGNOSIS — I2510 Atherosclerotic heart disease of native coronary artery without angina pectoris: Secondary | ICD-10-CM | POA: Diagnosis not present

## 2020-01-01 DIAGNOSIS — N189 Chronic kidney disease, unspecified: Secondary | ICD-10-CM | POA: Diagnosis not present

## 2020-01-01 DIAGNOSIS — Z794 Long term (current) use of insulin: Secondary | ICD-10-CM | POA: Diagnosis not present

## 2020-01-01 LAB — BASIC METABOLIC PANEL
Anion gap: 17 — ABNORMAL HIGH (ref 5–15)
BUN: 36 mg/dL — ABNORMAL HIGH (ref 6–20)
CO2: 20 mmol/L — ABNORMAL LOW (ref 22–32)
Calcium: 9.6 mg/dL (ref 8.9–10.3)
Chloride: 103 mmol/L (ref 98–111)
Creatinine, Ser: 1.78 mg/dL — ABNORMAL HIGH (ref 0.61–1.24)
GFR calc Af Amer: 47 mL/min — ABNORMAL LOW (ref >60)
GFR calc non Af Amer: 41 mL/min — ABNORMAL LOW (ref >60)
Glucose, Bld: 234 mg/dL — ABNORMAL HIGH (ref 70–99)
Potassium: 4.1 mmol/L (ref 3.5–5.1)
Sodium: 140 mmol/L (ref 135–145)

## 2020-01-01 MED ORDER — TORSEMIDE 20 MG PO TABS: 80.0000 mg | ORAL_TABLET | Freq: Two times a day (BID) | ORAL | 6 refills | Status: DC

## 2020-01-01 MED ORDER — METOLAZONE 2.5 MG PO TABS
2.5000 mg | ORAL_TABLET | ORAL | 3 refills | Status: DC
Start: 2020-01-01 — End: 2020-06-06

## 2020-01-01 NOTE — Patient Instructions (Addendum)
INCREASE Torsemide to 80mg  (4 tabs) twice a day  START Metolazone 2.5mg  (1 tab) weekly on SATURDAYS  Take an extra  Potassium 20 (1 tab) on saturdays with Metolazone  Labs today and repeat in 1 week We will only contact you if something comes back abnormal or we need to make some changes. Otherwise no news is good news!  Your provider has recommended that  you wear a Zio Patch for 3 days.  This monitor will record your heart rhythm for our review.  IF you have any symptoms while wearing the monitor please press the button.  If you have any issues with the patch or you notice a red or orange light on it please call the company at [phone].  Once you remove the patch please mail it back to the company as soon as possible so we can get the results.  Your physician recommends that you schedule a follow-up appointment in: 2 weeks the Nurse Practitioner/Physician Assistant  Please call office at [phone] option 2 if you have any questions or concerns.   At the [facility], you and your health needs are our priority. As part of our continuing mission to provide you with exceptional heart care, we have created designated Provider Care Teams. These Care Teams include your primary Cardiologist (physician) and Advanced Practice Providers (APPs- Physician Assistants and Nurse Practitioners) who all work together to provide you with the care you need, when you need it.   You may see any of the following providers on your designated Care Team at your next follow up: Marland Kitchen Dr Glori Bickers . Dr Loralie Champagne . Darrick Grinder, NP . Lyda Jester, PA . Audry Riles, PharmD   Please be sure to bring in all your medications bottles to every appointment.

## 2020-01-01 NOTE — Telephone Encounter (Signed)
CSW consulted to meet with pt regarding disability.  Pt applied for disability several months ago but is still in the process.  He is unsure if he is in the appeals process but he has lawyers involved so that is likely the case.  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  CSW called and left a message for [place] who is managing his case- she won't be back in the office until Tuesday so will attempt to follow up again at that time.  Jorge Ny, LCSW Clinical Social Worker Advanced Heart Failure Clinic Desk#: [phone] Cell#: [phone]

## 2020-01-01 NOTE — Progress Notes (Signed)
Zio patch placed onto patient.  All instructions and information reviewed with patient, they verbalize understanding with no questions. 

## 2020-01-03 ENCOUNTER — Other Ambulatory Visit (HOSPITAL_COMMUNITY): Payer: Self-pay | Admitting: Cardiology

## 2020-01-03 NOTE — Progress Notes (Signed)
Patient ID: Marcus Aday., male   DOB: [birth year], 59 y.o.   MRN: [id number] PCP: Dr. Sarajane Jews Cardiology: Dr. Aundra Dubin  HPI: Marcus Johnson is a 59 y.o. male with h/o DM2, CAD s/p CABG 6295, systolic HF due to ischemic cardiomyopathy with EF 20-25% (echo 12/15), DM2 and CKD. Marcus Johnson is s/p Medtronic single chamber ICD.  Admitted in 12/15 due to ADHF. Required short course milrinone for diuresis. Diuresed 30 pounds.   CPX 2/16 showed severely reduced functional capacity.  There was a significant disconnect between his symptoms and the CPX.  RHC in 6/16 showed fairly normal filling pressures and low but not markedly low cardiac output.   CPX (4/19) was submaximal but suggested severe limitation from HF.   Marcus Johnson was admitted in 6/19 with marked volume overload and dyspnea.  Echo in 6/19 showed EF 20% with severe global hypokinesis.  LHC/RHC showed severe native vessel CAD with patent LIMA-D, SVG-OM, and SVG-PLV; cardiac index low.  Marcus Johnson was started on milrinone and diuresed.  We discussed LVAD extensively in light of low output, creatinine that is trending up and concern for decompensation of RV over time. Marcus Johnson was adamant that Marcus Johnson did not want to pursue LVAD workup yet and did not want a referral for transplant evaluation. Milrinone was weaned off and Marcus Johnson was discharged home.   Admitted 08/01/18-08/07/18 with volume overload and left foot wound. Diuresed 38 lbs with IV lasix and then transitioned back to torsemide 100 mg BID. ID was consulted with concerns for left foot osteomyelitis noted on CT scan. Marcus Johnson got IV ancef q8 hours x 42 days. [facility] provided teaching and supplies for home antibiotics. Course complicated by AKI. DC weight 200 lbs.   Marcus Johnson went into atrial flutter and had DCCV to NSR in 1/20.   Echo in 4/21 showed EF 25% with apical akinesis and diffuse hypokinesis relatively preserved in lateral wall, mildly decreased RV systolic function, PASP 41 mmHg.  CPX in 5/21 showed severe functional limitation  suggestive of advanced HF.   Marcus Johnson returns for followup of CHF.  Weight is up 3 lbs.  Marcus Johnson forgot to take torsemide yesterday, does occasionally skip his medications.  No chest pain.  Marcus Johnson is short of breath walking 20-30 feet when it is hot.  Marcus Johnson is short of breath walking up the stairs.  No orthopnea/PND.  Lower leg ulcers have healed.  Marcus Johnson continues to have occasional "spells" of severe lightheadedness not associated with position.    Medtronic ICD interrogation: No VT.  Fluid index > threshold but impedance starting to trend back up.    Studies: Echo (12/15): EF 20-25% RV moderately HK CPX (2/16): RER 1.10, peak VO2 8.6, VE/VCO2 slope 43.7 => severely reduced functional capacity.  RHC (6/16): mean RA 8, PA 48/19 mean 30, mean PCWP 14, CI 2.03 Fick/2.28 Thermo, PVR 3.6.  Echo (5/17): EF 25%, mild LV dilation, moderate diastolic dysfunction, normal RV size with mild to moderate systolic dysfunction, mild mitral regurgitation CPX (4/19): Peak VO2 7, VE/VCO2 slope 41, RER 1.02 => submaximal but still suggestive of severe HF limitation.  Echo (6/19): EF 20%, diffuse severe HK, moderately decreased RV systolic function, moderate TR.  LHC/RHC (6/19): Severe native vessel CAD with patent LIMA-D, SVG-OM, and SVG-PLV; mean RA 15, PA 54/24 mean 37, mean PCWP 32, CI 1.86 Fick/1.64 thermo. PAPi 2, PVR 1.25 WU.  DC-CV 09/03/18 -->NSR  Echo (4/21): EF 25% with apical akinesis and diffuse hypokinesis relatively preserved in lateral wall, mildly decreased RV systolic  function, PASP 41 mmHg. CPX (5/21): RER 1.1, VE/VCO2 slope 34, peak VO2 10.1 => severe functional limitation suggestive of severe HF limitation.   Labs:  8/15 LDL 88 12/15 K+ 4.2 creatinine 1.09  1/16 K 4.3, creatinine 1.34 4/16 K 4.5, creatinine 1.19 3/17 K 3.8, creatinine 1.22, HCT 35.3, BNP 1597 5/17 K 4.1, creatinine 1.25 6/18 LDL 138, K 4.4, creatinine 1.5 6/19 hgb 13.5, K 4.9, creatinine 1.53 => 1.5 => 1.4, BNP 992 08/07/2018: K 4.3 Creatinine  1.86  6/20: LDL 98, TSH normal, Hgb 13.5, K 4.3, creatinine 2.05 7/20: K 4.2, creatinine 2.56 => 2.03 8/20: LDL 34, HDL 22 3/21: K 4.1, creatinine 1.92 4/21: K 4, creatinine 1.96  Review of systems complete and found to be negative unless listed in HPI.   SH:  Social History   Socioeconomic History  . Marital status: Divorced    Spouse name: Not on file  . Number of children: Not on file  . Years of education: Not on file  . Highest education level: Not on file  Occupational History  . Not on file  Tobacco Use  . Smoking status: Never Smoker  . Smokeless tobacco: Never Used  Substance and Sexual Activity  . Alcohol use: No    Alcohol/week: 0.0 standard drinks  . Drug use: No  . Sexual activity: Not on file  Other Topics Concern  . Not on file  Social History Narrative   Lives in [place] with his son and his family.  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   Social Determinants of Health   Financial Resource Strain:   . Difficulty of Paying Living Expenses:   Food Insecurity:   . Worried About Charity fundraiser in the Last Year:   . Arboriculturist in the Last Year:   Transportation Needs:   . Film/video editor (Medical):   Marland Kitchen Lack of Transportation (Non-Medical):   Physical Activity:   . Days of Exercise per Week:   . Minutes of Exercise per Session:   Stress:   . Feeling of Stress :   Social Connections:   . Frequency of Communication with Friends and Family:   . Frequency of Social Gatherings with Friends and Family:   . Attends Religious Services:   . Active Member of Clubs or Organizations:   . Attends Archivist Meetings:   Marland Kitchen Marital Status:   Intimate Partner Violence:   . Fear of Current or Ex-Partner:   . Emotionally Abused:   Marland Kitchen Physically Abused:   . Sexually Abused:     FH:  Family History  Problem Relation Age of Onset  . Diabetes Father        died in his 66's  . Hypertension Mother        died  in her 110's - had a ppm.  . Amblyopia Neg Hx   . Blindness Neg Hx   . Cataracts Neg Hx   . Glaucoma Neg Hx   . Macular degeneration Neg Hx   . Retinal detachment Neg Hx   . Strabismus Neg Hx   . Retinitis pigmentosa Neg Hx     Past Medical History:  Diagnosis Date  . AICD (automatic cardioverter/defibrillator) present    Single-chamber  implantable cardiac defibrillator - Medtronic  . CHF (congestive heart failure) ([place])   . Chronic systolic heart failure ([place])    a. Echo 5/13: Mild LVE, mild LVH, EF 10%, anteroseptal, lateral,  apical AK, mild MR, mild LAE, moderately reduced RVSF, mild RAE, PASP 60;  b. 07/2014 Echo: EF 20-2%, diff HK, AKI of antsep/apical/mid-apicalinferior, mod reduced RV.  Marland Kitchen Coronary artery disease    a. s/p CABG 2002 b. LHC 5/13:  dLM 80%, LAD subtotally occluded, pCFX occluded, pRCA 50%, mid? Occlusion with high take off of the PDA with 70% multiple lesions-not bypassed and supplies collaterals to LAD, LIMA-IM/ramus ok, S-OM ok, S-PLA branches ok. Medical therapy was recommended  . Gout    "on daily RX" (01/08/2018)  . Hypertension   . Ischemic cardiomyopathy    a. Echo 5/13: Mild LVE, mild LVH, EF 10%, anteroseptal, lateral, apical AK, mild MR, mild LAE, moderately reduced RVSF, mild RAE, PASP 60;  b. 01/2012 s/p MDT [id number] Protecta XT VR AICD;  c. 07/2014 Echo: EF 20-2%, diff HK, AKI of antsep/apical/mid-apicalinferior, mod reduced RV.  Marland Kitchen MRSA (methicillin resistant Staphylococcus aureus)    Status post right foot plantar deep infection with MRSA status post  I&D 10/2008  . Myocardial infarction [facility])    "was told I'd had several before heart OR in 2002" (01/08/2018)  . Noncompliance   . Peripheral neuropathy   . Syncope   . Type II diabetes mellitus ([place])    requiring insulin     Current Outpatient Medications  Medication Sig Dispense Refill  . allopurinol (ZYLOPRIM) 300 MG tablet Take 600 mg by mouth daily as needed (gout).    Marland Kitchen apixaban (ELIQUIS) 5 MG  TABS tablet Take 1 tablet (5 mg total) by mouth 2 (two) times daily. 60 tablet 6  . blood glucose meter kit and supplies KIT Use as directed. 1 each 0  . carvedilol (COREG) 12.5 MG tablet Take 1 tablet (12.5 mg total) by mouth 2 (two) times daily. 30 tablet 11  . dapagliflozin propanediol (FARXIGA) 10 MG TABS tablet Take 10 mg by mouth daily before breakfast. 30 tablet 6  . digoxin (LANOXIN) 0.125 MG tablet TAKE 1 TABLET BY MOUTH EVERY DAY 30 tablet 3  . ENTRESTO 49-51 MG TAKE 1 TABLET BY MOUTH TWICE A DAY 60 tablet 6  . glucose blood test strip Use to test blood sugar twice a day 100 each 3  . insulin aspart (NOVOLOG FLEXPEN) 100 UNIT/ML FlexPen INJECT 5 UNITS INTO THE SKIN 3 TIMES DAILY WITH MEALS 15 mL 11  . Insulin Pen Needle (BD PEN NEEDLE NANO U/F) 32G X 4 MM MISC USE 4 TIMES A DAY 100 each 11  . ketoconazole (NIZORAL) 2 % cream Apply 1 application topically 2 (two) times daily as needed for irritation. 30 g 5  . KLOR-CON M20 20 MEQ tablet TAKE 1 TABLET (20 MEQ TOTAL) BY MOUTH DAILY. TAKE 1 TABLET EXTRA ON DAYS YOU TAKE METOLAZONE. 90 tablet 3  . Lancets (ONETOUCH ULTRASOFT) lancets Use to test blood sugar twice a day 100 each 3  . LANTUS SOLOSTAR 100 UNIT/ML Solostar Pen INJECT 50 UNITS INTO THE SKIN AT BEDTIME. 15 mL 1  . rosuvastatin (CRESTOR) 40 MG tablet TAKE 1 TABLET BY MOUTH EVERY DAY 90 tablet 3  . spironolactone (ALDACTONE) 25 MG tablet TAKE 1 TABLET BY MOUTH EVERY DAY 90 tablet 0  . torsemide (DEMADEX) 20 MG tablet Take 4 tablets (80 mg total) by mouth 2 (two) times daily. 240 tablet 6  . metolazone (ZAROXOLYN) 2.5 MG tablet Take 1 tablet (2.5 mg total) by mouth once a week. On Saturdays 5 tablet 3   Current Facility-Administered Medications  Medication Dose Route  Frequency Provider Last Rate Last Admin  . Bevacizumab (AVASTIN) SOLN 1.25 mg  1.25 mg Intravitreal  [name], MD   1.25 mg at 08/27/18 2139  . Bevacizumab (AVASTIN) SOLN 1.25 mg  1.25 mg Intravitreal  [name], MD   1.25 mg at 09/24/18 1722  . Bevacizumab (AVASTIN) SOLN 1.25 mg  1.25 mg Intravitreal  [name], MD   1.25 mg at 09/24/18 1722    Vitals:   01/01/20 1129  BP: (!) 94/52  Pulse: 79  SpO2: 97%  Weight: 98.1 kg (216 lb 3.2 oz)  Height: 5' 10.5" (1.791 m)   Wt Readings from Last 3 Encounters:  01/01/20 98.1 kg (216 lb 3.2 oz)  11/25/19 97 kg (213 lb 12.8 oz)  05/05/19 94.8 kg (209 lb)    PHYSICAL EXAM: General: NAD Neck: JVP 10 cm, no thyromegaly or thyroid nodule.  Lungs: Clear to auscultation bilaterally with normal respiratory effort. CV: Lateral PMI.  Heart regular S1/S2, no S3/S4, no murmur.  Trace ankle edema.  No carotid bruit.  Difficult to palpate pedal pulses.  Abdomen: Soft, nontender, no hepatosplenomegaly, no distention.  Skin: Intact without lesions or rashes.  Neurologic: Alert and oriented x 3.  Psych: Normal affect. Extremities: No clubbing or cyanosis.  HEENT: Normal.   ASSESSMENT & PLAN: 1.Chronic systolic HF:ICM.HasMedtronic ICD. Narrow QRS, not CRT candidate. Has required milrinone multiple times in the past for low output HF. Offered advanced therapies in past but declined. Echo 6/19 with EF 20% and moderately decreased RV systolic function.CPX 4/19 with severe functional limitation due to [facility] 6/19 showed low output and coronary angiography showed no interventional targets.Echo 08/04/18 with EF 20-25%, mild LVH, diffuse HK, akinesis of anteroseptal and anterior myocardium, grade 2 DD, trivial AI, LA mod dilated, RV moderately dilated, RA mildly dilated, moderate TR, PA peak pressure 49 mm Hg.Echo in 4/21 showed EF 25%, mildly decreased RV systolic function.  CPX in 5/21 showed severe HF limitation.  NYHA class III symptoms with volume overload and weight up.   I suspect that a lot of his symptomatology is due to low output HF.  History of cardiorenal syndrome/diabetic nephropathy, last creatinine 1.96.    - Increase torsemide to 80 mg  bid.  Marcus Johnson will also take 2.5 mg metolazone once weekly starting tomorrow.  BMET today and again in 1 week.  - Continue Entresto49/51 mg BID, no BP room to increase.  - Continuespironolactone25 mg daily, take before bed to limit effect on BP during the day.  -Intolerant ivabradine due to nausea. - Continue coreg to 12.5 mg BID, no BP room to increase and would also avoid increase with low output HF history.  - Continue digoxin0.125 mg daily. Check digoxin level today.  - Continue dapafliglozin 10 mg daily.    - Has been very resistant to LVAD/transplant evaluation, both offered in past. We discussed LVAD again today but Marcus Johnson is still not interested.I am worried about his increasing creatinine and gradually progressive symptomatic worsening.  I worry that we will miss the window for advanced therapies.  - Marcus Johnson was not interesed in barostimulation study.  - QRS not wide, not CRT candidate.  2. CAD s/p CABG2002:Last cath in 6/19 with patent grafts. No chest pain.  - No ASA given apixaban use.  - Good lipids in 8/20 on Crestor 40 mg daily.  3. CKD Stage 3:Creatinine up to 1.9 when checked most recently. I suspect that this is a combination of cardiorenal syndrome and diabetic nephropathy.  -  BMET  - I have recommended nephrology referral but Marcus Johnson does not want this yet due to finances.   4. DM: History of very poor control.  Followup with PCP.  5. Atrial flutter: s/p DC-CV 08/21/18.  Marcus Johnson is in NSR today on echo, denies palpitations.  - Continue Eliquis.  6. Lightheadedness: Marcus Johnson get debilitating spells of lightheadedness.  This may be orthostatic, though SBP seems to be in the 120s whenever Marcus Johnson checks at home.  Marcus Johnson has a single lead ICD so cannot rule out atrial arrhythmias.  No syncope/presyncope.  - I will place a Zio patch to monitor for arrhythmias.   Followup in 2 wks with NP/PA to reassess volume.   Loralie Champagne, MD 01/03/2020

## 2020-01-05 ENCOUNTER — Telehealth: Payer: Self-pay

## 2020-01-05 NOTE — Telephone Encounter (Signed)
Error in entry.
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
## 2020-01-08 ENCOUNTER — Telehealth (HOSPITAL_COMMUNITY): Payer: Self-pay | Admitting: Licensed Clinical Social Worker

## 2020-01-08 NOTE — Telephone Encounter (Signed)
CSW called pt law office again to inquire if they need further documentation for his disability case- unable to reach.  Emailed pt case worker to inquire.  Will continue to follow and assist as needed  Jorge Ny, [facility] Desk#: [phone] Cell#: [phone]

## 2020-01-11 ENCOUNTER — Other Ambulatory Visit: Payer: Self-pay

## 2020-01-11 ENCOUNTER — Encounter (INDEPENDENT_AMBULATORY_CARE_PROVIDER_SITE_OTHER): Payer: Self-pay | Admitting: Ophthalmology

## 2020-01-11 ENCOUNTER — Telehealth: Payer: Self-pay | Admitting: Internal Medicine

## 2020-01-11 ENCOUNTER — Ambulatory Visit (INDEPENDENT_AMBULATORY_CARE_PROVIDER_SITE_OTHER): Payer: 59 | Admitting: Ophthalmology

## 2020-01-11 DIAGNOSIS — H35033 Hypertensive retinopathy, bilateral: Secondary | ICD-10-CM

## 2020-01-11 DIAGNOSIS — E113512 Type 2 diabetes mellitus with proliferative diabetic retinopathy with macular edema, left eye: Secondary | ICD-10-CM | POA: Diagnosis not present

## 2020-01-11 DIAGNOSIS — H25813 Combined forms of age-related cataract, bilateral: Secondary | ICD-10-CM

## 2020-01-11 DIAGNOSIS — H3581 Retinal edema: Secondary | ICD-10-CM

## 2020-01-11 DIAGNOSIS — H4312 Vitreous hemorrhage, left eye: Secondary | ICD-10-CM

## 2020-01-11 DIAGNOSIS — E113411 Type 2 diabetes mellitus with severe nonproliferative diabetic retinopathy with macular edema, right eye: Secondary | ICD-10-CM | POA: Diagnosis not present

## 2020-01-11 DIAGNOSIS — I10 Essential (primary) hypertension: Secondary | ICD-10-CM

## 2020-01-11 NOTE — Telephone Encounter (Signed)
LMOM for patient to call Device clinic , # provided. Needs follow up with Dr Caryl Comes.

## 2020-01-11 NOTE — Telephone Encounter (Signed)
New message   Called pt to schedule appt with SK for past due f/u. Pt states that Dr. Aundra Dubin is his heart doctor and checks his device. He said he has an appt tomorrow with Dr. Aundra Dubin and will ask him if he needs to f/u here with Dr. Caryl Comes. I told pt I would inform our nurse as well because Dr. Caryl Comes is his defib doctor.

## 2020-01-12 ENCOUNTER — Ambulatory Visit (HOSPITAL_COMMUNITY)
Admission: RE | Admit: 2020-01-12 | Discharge: 2020-01-12 | Disposition: A | Discharge status: Home / Self Care | Payer: 59 | Source: Ambulatory Visit | Attending: Cardiology | Admitting: Cardiology

## 2020-01-12 ENCOUNTER — Telehealth (HOSPITAL_COMMUNITY): Payer: Self-pay | Admitting: Licensed Clinical Social Worker

## 2020-01-12 DIAGNOSIS — I5042 Chronic combined systolic (congestive) and diastolic (congestive) heart failure: Secondary | ICD-10-CM | POA: Insufficient documentation

## 2020-01-12 LAB — BASIC METABOLIC PANEL
Anion gap: 15 (ref 5–15)
BUN: 51 mg/dL — ABNORMAL HIGH (ref 6–20)
CO2: 26 mmol/L (ref 22–32)
Calcium: 9.7 mg/dL (ref 8.9–10.3)
Chloride: 94 mmol/L — ABNORMAL LOW (ref 98–111)
Creatinine, Ser: 2.05 mg/dL — ABNORMAL HIGH (ref 0.61–1.24)
GFR calc Af Amer: 40 mL/min — ABNORMAL LOW (ref >60)
GFR calc non Af Amer: 34 mL/min — ABNORMAL LOW (ref >60)
Glucose, Bld: 416 mg/dL — ABNORMAL HIGH (ref 70–99)
Potassium: 3.9 mmol/L (ref 3.5–5.1)
Sodium: 135 mmol/L (ref 135–145)

## 2020-01-12 NOTE — Telephone Encounter (Signed)
CSW called [employer] law office again and was able to speak with [place]- pts case worker.  Cierra reports that they have everything they need as far as medical notes go but would like a letter from the MD stating what they treat the pt for and how this diagnosis restricts their ability to work.  CSW sent message to clinic RN to inquire about letter- would need to be faxed to [phone]  Pt reports no other concerns at this time- will continue to follow and assist as needed  Jorge Ny, [place] Worker [facility] Desk#: [phone] Cell#: [phone]

## 2020-01-13 NOTE — Telephone Encounter (Signed)
Attempted to reach patient, no answer.  Noted that patient is scheuled for appt with Marcus Johnson on 02/02/20 that he scheduled yesterday.
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
## 2020-01-22 NOTE — Addendum Note (Signed)
Encounter addended by: Micki Riley, RN on: 01/22/2020 2:22 PM  Actions taken: Imaging Exam ended

## 2020-01-25 ENCOUNTER — Encounter (INDEPENDENT_AMBULATORY_CARE_PROVIDER_SITE_OTHER): Payer: Self-pay | Admitting: Ophthalmology

## 2020-01-25 ENCOUNTER — Ambulatory Visit (INDEPENDENT_AMBULATORY_CARE_PROVIDER_SITE_OTHER): Payer: 59 | Admitting: Ophthalmology

## 2020-01-25 ENCOUNTER — Other Ambulatory Visit: Payer: Self-pay

## 2020-01-25 DIAGNOSIS — H3581 Retinal edema: Secondary | ICD-10-CM

## 2020-01-25 DIAGNOSIS — E113512 Type 2 diabetes mellitus with proliferative diabetic retinopathy with macular edema, left eye: Secondary | ICD-10-CM

## 2020-01-25 DIAGNOSIS — E113411 Type 2 diabetes mellitus with severe nonproliferative diabetic retinopathy with macular edema, right eye: Secondary | ICD-10-CM | POA: Diagnosis not present

## 2020-01-25 DIAGNOSIS — H4312 Vitreous hemorrhage, left eye: Secondary | ICD-10-CM | POA: Diagnosis not present

## 2020-01-25 DIAGNOSIS — H25813 Combined forms of age-related cataract, bilateral: Secondary | ICD-10-CM

## 2020-01-25 DIAGNOSIS — I10 Essential (primary) hypertension: Secondary | ICD-10-CM

## 2020-01-25 DIAGNOSIS — H35033 Hypertensive retinopathy, bilateral: Secondary | ICD-10-CM | POA: Diagnosis not present

## 2020-01-25 MED ORDER — BEVACIZUMAB CHEMO INJECTION 1.25MG/0.05ML SYRINGE FOR KALEIDOSCOPE
1.2500 mg | INTRAVITREAL | Status: AC | PRN
  Administered 2020-01-25: 1.25 mg via INTRAVITREAL

## 2020-01-29 ENCOUNTER — Encounter (HOSPITAL_COMMUNITY): Payer: Self-pay | Admitting: *Deleted

## 2020-01-31 NOTE — Progress Notes (Signed)
Patient ID: Marcus Duman., male   DOB: 01-10-1961, 59 y.o.   MRN: [id number] PCP: Dr. Sarajane Jews Cardiology: Dr. Aundra Dubin  HPI: [name] is a 59 y.o. male with h/o DM2, CAD s/p CABG 3419, systolic HF due to ischemic cardiomyopathy with EF 20-25% (echo 12/15), DM2 and CKD. He is s/p Medtronic single chamber ICD.  Admitted in 12/15 due to ADHF. Required short course milrinone for diuresis. Diuresed 30 pounds.   CPX 2/16 showed severely reduced functional capacity.  There was a significant disconnect between his symptoms and the CPX.  RHC in 6/16 showed fairly normal filling pressures and low but not markedly low cardiac output.   CPX (4/19) was submaximal but suggested severe limitation from HF.   He was admitted in 6/19 with marked volume overload and dyspnea.  Echo in 6/19 showed EF 20% with severe global hypokinesis.  LHC/RHC showed severe native vessel CAD with patent LIMA-D, SVG-OM, and SVG-PLV; cardiac index low.  He was started on milrinone and diuresed.  We discussed LVAD extensively in light of low output, creatinine that is trending up and concern for decompensation of RV over time. He was adamant that he did not want to pursue LVAD workup yet and did not want a referral for transplant evaluation. Milrinone was weaned off and he was discharged home.   Admitted 08/01/18-08/07/18 with volume overload and left foot wound. Diuresed 38 lbs with IV lasix and then transitioned back to torsemide 100 mg BID. ID was consulted with concerns for left foot osteomyelitis noted on CT scan. He got IV ancef q8 hours x 42 days. [facility] provided teaching and supplies for home antibiotics. Course complicated by AKI. DC weight 200 lbs.   He went into atrial flutter and had DCCV to NSR in 1/20.   Echo in 4/21 showed EF 25% with apical akinesis and diffuse hypokinesis relatively preserved in lateral wal, mildly decreased RV systolic function, PASP 41 mmHg.  CPX in 5/21 showed severe functional limitation suggestive  of advanced HF.   Today he returns for HF follow up. Last visit torsemide was increased to 80 /80 and metolazone was started once a week. Overall feeling ok. Still having some lightheadedness every other day. SOB with exertion. SOB walking up steps. Denies PND/Orthopnea. Appetite ok. No fever or chills. Weight at home 205-210 pounds. Taking all medications. Trying to get disability.   Medtronic ICD interrogation: Activity < 1 hour, No VT, No A fib, impedance down.     Studies: Echo (12/15): EF 20-25% RV moderately HK CPX (2/16): RER 1.10, peak VO2 8.6, VE/VCO2 slope 43.7 => severely reduced functional capacity.  RHC (6/16): mean RA 8, PA 48/19 mean 30, mean PCWP 14, CI 2.03 Fick/2.28 Thermo, PVR 3.6.  Echo (5/17): EF 25%, mild LV dilation, moderate diastolic dysfunction, normal RV size with mild to moderate systolic dysfunction, mild mitral regurgitation CPX (4/19): Peak VO2 7, VE/VCO2 slope 41, RER 1.02 => submaximal but still suggestive of severe HF limitation.  Echo (6/19): EF 20%, diffuse severe HK, moderately decreased RV systolic function, moderate TR.  LHC/RHC (6/19): Severe native vessel CAD with patent LIMA-D, SVG-OM, and SVG-PLV; mean RA 15, PA 54/24 mean 37, mean PCWP 32, CI 1.86 Fick/1.64 thermo. PAPi 2, PVR 1.25 WU.  DC-CV 09/03/18 -->NSR  Echo (4/21): EF 25% with apical akinesis and diffuse hypokinesis relatively preserved in lateral wall, mildly decreased RV systolic function, PASP 41 mmHg. CPX (5/21): RER 1.1, VE/VCO2 slope 34, peak VO2 10.1 => severe functional limitation  suggestive of severe HF limitation.   Labs:  8/15 LDL 88 12/15 K+ 4.2 creatinine 1.09  1/16 K 4.3, creatinine 1.34 4/16 K 4.5, creatinine 1.19 3/17 K 3.8, creatinine 1.22, HCT 35.3, BNP 1597 5/17 K 4.1, creatinine 1.25 6/18 LDL 138, K 4.4, creatinine 1.5 6/19 hgb 13.5, K 4.9, creatinine 1.53 => 1.5 => 1.4, BNP 992 08/07/2018: K 4.3 Creatinine 1.86  6/20: LDL 98, TSH normal, Hgb 13.5, K 4.3, creatinine  2.05 7/20: K 4.2, creatinine 2.56 => 2.03 8/20: LDL 34, HDL 22 3/21: K 4.1, creatinine 1.92 4/21: K 4, creatinine 1.96  Review of systems complete and found to be negative unless listed in HPI.   SH:  Social History   Socioeconomic History   Marital status: Divorced    Spouse name: Not on file   Number of children: Not on file   Years of education: Not on file   Highest education level: Not on file  Occupational History   Not on file  Tobacco Use   Smoking status: Never Smoker   Smokeless tobacco: Never Used  Vaping Use   Vaping Use: Never used  Substance and Sexual Activity   Alcohol use: No    Alcohol/week: 0.0 standard drinks   Drug use: No   Sexual activity: Not on file  Other Topics Concern   Not on file  Social History Narrative   Lives in [place] with his son and his family.  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   Social Determinants of Health   Financial Resource Strain:    Difficulty of Paying Living Expenses:   Food Insecurity:    Worried About Charity fundraiser in the Last Year:    Arboriculturist in the Last Year:   Transportation Needs:    Film/video editor (Medical):    Lack of Transportation (Non-Medical):   Physical Activity:    Days of Exercise per Week:    Minutes of Exercise per Session:   Stress:    Feeling of Stress :   Social Connections:    Frequency of Communication with Friends and Family:    Frequency of Social Gatherings with Friends and Family:    Attends Religious Services:    Active Member of Clubs or Organizations:    Attends Music therapist:    Marital Status:   Intimate Partner Violence:    Fear of Current or Ex-Partner:    Emotionally Abused:    Physically Abused:    Sexually Abused:     FH:  Family History  Problem Relation Age of Onset   Diabetes Father        died in his 80's   Hypertension Mother        died in her 57's - had a  ppm.   Amblyopia Neg Hx    Blindness Neg Hx    Cataracts Neg Hx    Glaucoma Neg Hx    Macular degeneration Neg Hx    Retinal detachment Neg Hx    Strabismus Neg Hx    Retinitis pigmentosa Neg Hx     Past Medical History:  Diagnosis Date   AICD (automatic cardioverter/defibrillator) present    Single-chamber  implantable cardiac defibrillator - Medtronic   CHF (congestive heart failure) (HCC)    Chronic systolic heart failure ([place])    a. Echo 5/13: Mild LVE, mild LVH, EF 10%, anteroseptal, lateral, apical AK, mild MR, mild LAE, moderately reduced RVSF,  mild RAE, PASP 60;  b. 07/2014 Echo: EF 20-2%, diff HK, AKI of antsep/apical/mid-apicalinferior, mod reduced RV.   Coronary artery disease    a. s/p CABG 2002 b. LHC 5/13:  dLM 80%, LAD subtotally occluded, pCFX occluded, pRCA 50%, mid? Occlusion with high take off of the PDA with 70% multiple lesions-not bypassed and supplies collaterals to LAD, LIMA-IM/ramus ok, S-OM ok, S-PLA branches ok. Medical therapy was recommended   Gout    "on daily RX" (01/08/2018)   Hypertension    Ischemic cardiomyopathy    a. Echo 5/13: Mild LVE, mild LVH, EF 10%, anteroseptal, lateral, apical AK, mild MR, mild LAE, moderately reduced RVSF, mild RAE, PASP 60;  b. 01/2012 s/p MDT [id number] Protecta XT VR AICD;  c. 07/2014 Echo: EF 20-2%, diff HK, AKI of antsep/apical/mid-apicalinferior, mod reduced RV.   MRSA (methicillin resistant Staphylococcus aureus)    Status post right foot plantar deep infection with MRSA status post  I&D 10/2008   Myocardial infarction [facility])    "was told I'd had several before heart OR in 2002" (01/08/2018)   Noncompliance    Peripheral neuropathy    Syncope    Type II diabetes mellitus ([place])    requiring insulin     Current Outpatient Medications  Medication Sig Dispense Refill   allopurinol (ZYLOPRIM) 300 MG tablet Take 600 mg by mouth daily as needed (gout).     apixaban (ELIQUIS) 5 MG TABS tablet Take 1  tablet (5 mg total) by mouth 2 (two) times daily. 60 tablet 6   blood glucose meter kit and supplies KIT Use as directed. 1 each 0   carvedilol (COREG) 12.5 MG tablet Take 1 tablet (12.5 mg total) by mouth 2 (two) times daily. 30 tablet 11   dapagliflozin propanediol (FARXIGA) 10 MG TABS tablet Take 10 mg by mouth daily before breakfast. 30 tablet 6   digoxin (LANOXIN) 0.125 MG tablet TAKE 1 TABLET BY MOUTH EVERY DAY 90 tablet 3   ENTRESTO 49-51 MG TAKE 1 TABLET BY MOUTH TWICE A DAY 60 tablet 6   glucose blood test strip Use to test blood sugar twice a day 100 each 3   insulin aspart (NOVOLOG FLEXPEN) 100 UNIT/ML FlexPen INJECT 5 UNITS INTO THE SKIN 3 TIMES DAILY WITH MEALS 15 mL 11   Insulin Pen Needle (BD PEN NEEDLE NANO U/F) 32G X 4 MM MISC USE 4 TIMES A DAY 100 each 11   ketoconazole (NIZORAL) 2 % cream Apply 1 application topically 2 (two) times daily as needed for irritation. 30 g 5   KLOR-CON M20 20 MEQ tablet TAKE 1 TABLET (20 MEQ TOTAL) BY MOUTH DAILY. TAKE 1 TABLET EXTRA ON DAYS YOU TAKE METOLAZONE. 90 tablet 3   Lancets (ONETOUCH ULTRASOFT) lancets Use to test blood sugar twice a day 100 each 3   LANTUS SOLOSTAR 100 UNIT/ML Solostar Pen INJECT 50 UNITS INTO THE SKIN AT BEDTIME. 15 mL 1   metolazone (ZAROXOLYN) 2.5 MG tablet Take 1 tablet (2.5 mg total) by mouth once a week. On Saturdays 5 tablet 3   rosuvastatin (CRESTOR) 40 MG tablet TAKE 1 TABLET BY MOUTH EVERY DAY 90 tablet 3   spironolactone (ALDACTONE) 25 MG tablet TAKE 1 TABLET BY MOUTH EVERY DAY 90 tablet 0   torsemide (DEMADEX) 20 MG tablet Take 4 tablets (80 mg total) by mouth 2 (two) times daily. 240 tablet 6   Current Facility-Administered Medications  Medication Dose Route Frequency Provider Last Rate Last Admin   Bevacizumab (  AVASTIN) SOLN 1.25 mg  1.25 mg Intravitreal  [name], MD   1.25 mg at 08/27/18 2139   Bevacizumab (AVASTIN) SOLN 1.25 mg  1.25 mg Intravitreal  [name], MD   1.25 mg at  09/24/18 1722   Bevacizumab (AVASTIN) SOLN 1.25 mg  1.25 mg Intravitreal  [name], MD   1.25 mg at 09/24/18 1722    Vitals:   02/01/20 0940  BP: 116/70  Pulse: 83  SpO2: 100%  Weight: 93.3 kg (205 lb 9.6 oz)  Height: 5' 10.5" (1.791 m)   Wt Readings from Last 3 Encounters:  02/01/20 93.3 kg (205 lb 9.6 oz)  01/01/20 98.1 kg (216 lb 3.2 oz)  11/25/19 97 kg (213 lb 12.8 oz)    PHYSICAL EXAM: General:  Walked in the clinic.  No resp difficulty HEENT: normal Neck: supple. no JVD. Carotids 2+ bilat; no bruits. No lymphadenopathy or thryomegaly appreciated. Cor: PMI nondisplaced. Regular rate & rhythm. No rubs, gallops or murmurs. Lungs: clear Abdomen: soft, nontender, nondistended. No hepatosplenomegaly. No bruits or masses. Good bowel sounds. Extremities: no cyanosis, clubbing, rash, edema Neuro: alert & orientedx3, cranial nerves grossly intact. moves all 4 extremities w/o difficulty. Affect pleasant   ASSESSMENT & PLAN: 1.Chronic systolic HF:ICM.HasMedtronic ICD. Narrow QRS, not CRT candidate. Has required milrinone multiple times in the past for low output HF. Offered advanced therapies in past but declined. Echo 6/19 with EF 20% and moderately decreased RV systolic function.CPX 4/19 with severe functional limitation due to [facility] 6/19 showed low output and coronary angiography showed no interventional targets.Echo 08/04/18 with EF 20-25%, mild LVH, diffuse HK, akinesis of anteroseptal and anterior myocardium, grade 2 DD, trivial AI, LA mod dilated, RV moderately dilated, RA mildly dilated, moderate TR, PA peak pressure 49 mm Hg.Echo in 4/21 showed EF 25%, mildly decreased RV systolic function.  CPX in 5/21 showed severe HF limitation.   NYHA III. Volume status stable. Continue torsemide to 80 mg bid + metolazone once a week.  - Continue Entresto49/51 mg BID, no BP room to increase.  - Continuespironolactone25 mg daily, take before bed to limit effect on BP  during the day.  -Intolerant ivabradine due to nausea. - Continue coreg to 12.5 mg BID, no BP room to increase and would also avoid increase with low output HF history.  - Continue digoxin0.125 mg daily, dig level < 0.2 .  - Continue dapafliglozin 10 mg daily.    - Has been very resistant to LVAD/transplant evaluation, both offered in past. We discussed LVAD again today but he is still not interested. - He was not interesed in barostimulation study.  - QRS not wide, not CRT candidate.  - Check BMET today.  2. CAD s/p CABG2002:Last cath in 6/19 with patent grafts. No chest pain.   - No ASA given apixaban use.  - Good lipids in 8/20 on Crestor 40 mg daily.  3. CKD Stage 3:Suspect that this is a combination of cardiorenal syndrome and diabetic nephropathy.  - Check BMET today.   - nephrology referral delayed per his request due to finances.   4. DM: History of very poor control.  Followup with PCP.  5. Atrial flutter: s/p DC-CV 08/21/18.   - Continue Eliquis.  6. Lightheadedness:  This may be orthostatic, though SBP seems to be in the 120s whenever he checks at home.  He has a single lead ICD so cannot rule out atrial arrhythmias.  No syncope/presyncope.  Zio patch per Dr Aundra Dubin short runs of  aflutter no arrhythmia. Discussed today.    Follow up 2 months with Dr Aundra Dubin. Greater than 50% of the (total minutes 30) visit spent in counseling/coordination of care regarding the above. Discussed low salt food choices, limiting fluid intake to < 2 liter per day.      Darrick Grinder, NP 02/01/2020

## 2020-02-01 ENCOUNTER — Other Ambulatory Visit: Payer: Self-pay

## 2020-02-01 ENCOUNTER — Ambulatory Visit (HOSPITAL_COMMUNITY)
Admission: RE | Admit: 2020-02-01 | Discharge: 2020-02-01 | Disposition: A | Discharge status: Home / Self Care | Payer: 59 | Source: Ambulatory Visit | Attending: Internal Medicine | Admitting: Internal Medicine

## 2020-02-01 ENCOUNTER — Ambulatory Visit (INDEPENDENT_AMBULATORY_CARE_PROVIDER_SITE_OTHER): Payer: 59 | Admitting: *Deleted

## 2020-02-01 ENCOUNTER — Encounter (HOSPITAL_COMMUNITY): Payer: Self-pay

## 2020-02-01 VITALS — BP 116/70 | HR 83 | Ht 70.5 in | Wt 205.6 lb

## 2020-02-01 DIAGNOSIS — Z8249 Family history of ischemic heart disease and other diseases of the circulatory system: Secondary | ICD-10-CM | POA: Diagnosis not present

## 2020-02-01 DIAGNOSIS — N183 Chronic kidney disease, stage 3 unspecified: Secondary | ICD-10-CM | POA: Insufficient documentation

## 2020-02-01 DIAGNOSIS — I130 Hypertensive heart and chronic kidney disease with heart failure and stage 1 through stage 4 chronic kidney disease, or unspecified chronic kidney disease: Secondary | ICD-10-CM | POA: Diagnosis not present

## 2020-02-01 DIAGNOSIS — Z833 Family history of diabetes mellitus: Secondary | ICD-10-CM | POA: Insufficient documentation

## 2020-02-01 DIAGNOSIS — Z951 Presence of aortocoronary bypass graft: Secondary | ICD-10-CM | POA: Insufficient documentation

## 2020-02-01 DIAGNOSIS — E1122 Type 2 diabetes mellitus with diabetic chronic kidney disease: Secondary | ICD-10-CM | POA: Diagnosis not present

## 2020-02-01 DIAGNOSIS — M109 Gout, unspecified: Secondary | ICD-10-CM | POA: Diagnosis not present

## 2020-02-01 DIAGNOSIS — I4892 Unspecified atrial flutter: Secondary | ICD-10-CM | POA: Diagnosis not present

## 2020-02-01 DIAGNOSIS — I5022 Chronic systolic (congestive) heart failure: Secondary | ICD-10-CM

## 2020-02-01 DIAGNOSIS — N1831 Chronic kidney disease, stage 3a: Secondary | ICD-10-CM

## 2020-02-01 DIAGNOSIS — Z7901 Long term (current) use of anticoagulants: Secondary | ICD-10-CM | POA: Diagnosis not present

## 2020-02-01 DIAGNOSIS — I5042 Chronic combined systolic (congestive) and diastolic (congestive) heart failure: Secondary | ICD-10-CM

## 2020-02-01 DIAGNOSIS — R42 Dizziness and giddiness: Secondary | ICD-10-CM | POA: Insufficient documentation

## 2020-02-01 DIAGNOSIS — I255 Ischemic cardiomyopathy: Secondary | ICD-10-CM | POA: Insufficient documentation

## 2020-02-01 DIAGNOSIS — Z95810 Presence of automatic (implantable) cardiac defibrillator: Secondary | ICD-10-CM | POA: Insufficient documentation

## 2020-02-01 DIAGNOSIS — Z79899 Other long term (current) drug therapy: Secondary | ICD-10-CM | POA: Insufficient documentation

## 2020-02-01 DIAGNOSIS — E1142 Type 2 diabetes mellitus with diabetic polyneuropathy: Secondary | ICD-10-CM | POA: Insufficient documentation

## 2020-02-01 DIAGNOSIS — I480 Paroxysmal atrial fibrillation: Secondary | ICD-10-CM

## 2020-02-01 DIAGNOSIS — I2510 Atherosclerotic heart disease of native coronary artery without angina pectoris: Secondary | ICD-10-CM | POA: Diagnosis not present

## 2020-02-01 DIAGNOSIS — I252 Old myocardial infarction: Secondary | ICD-10-CM | POA: Diagnosis not present

## 2020-02-01 DIAGNOSIS — Z794 Long term (current) use of insulin: Secondary | ICD-10-CM | POA: Diagnosis not present

## 2020-02-01 LAB — BASIC METABOLIC PANEL
Anion gap: 14 (ref 5–15)
BUN: 52 mg/dL — ABNORMAL HIGH (ref 6–20)
CO2: 27 mmol/L (ref 22–32)
Calcium: 10 mg/dL (ref 8.9–10.3)
Chloride: 97 mmol/L — ABNORMAL LOW (ref 98–111)
Creatinine, Ser: 1.76 mg/dL — ABNORMAL HIGH (ref 0.61–1.24)
GFR calc Af Amer: 48 mL/min — ABNORMAL LOW (ref >60)
GFR calc non Af Amer: 41 mL/min — ABNORMAL LOW (ref >60)
Glucose, Bld: 276 mg/dL — ABNORMAL HIGH (ref 70–99)
Potassium: 3.5 mmol/L (ref 3.5–5.1)
Sodium: 138 mmol/L (ref 135–145)
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
## 2020-02-01 NOTE — Patient Instructions (Signed)
It was great to see you today! No medication changes are needed at this time.  Labs today We will only contact you if something comes back abnormal or we need to make some changes. Otherwise no news is good news!  Your physician recommends that you schedule a follow-up appointment in: 2 months with Dr. McLean  Do the following things EVERYDAY: 1) Weigh yourself in the morning before breakfast. Write it down and keep it in a log. 2) Take your medicines as prescribed 3) Eat low salt foods--Limit salt (sodium) to 2000 mg per day.  4) Stay as active as you can everyday 5) Limit all fluids for the day to less than 2 liters   At the [facility], you and your health needs are our priority. As part of our continuing mission to provide you with exceptional heart care, we have created designated Provider Care Teams. These Care Teams include your primary Cardiologist (physician) and Advanced Practice Providers (APPs- Physician Assistants and Nurse Practitioners) who all work together to provide you with the care you need, when you need it.   You may see any of the following providers on your designated Care Team at your next follow up: . Dr Daniel Bensimhon . Dr Dalton McLean . Amy Clegg, NP . Brittainy Simmons, PA . Lauren Kemp, PharmD   Please be sure to bring in all your medications bottles to every appointment.     

## 2020-02-02 ENCOUNTER — Other Ambulatory Visit: Payer: Self-pay

## 2020-02-02 ENCOUNTER — Encounter: Payer: Self-pay | Admitting: Student

## 2020-02-02 ENCOUNTER — Ambulatory Visit (INDEPENDENT_AMBULATORY_CARE_PROVIDER_SITE_OTHER): Payer: 59 | Admitting: Student

## 2020-02-02 VITALS — BP 110/70 | HR 77 | Ht 70.5 in | Wt 206.0 lb

## 2020-02-02 DIAGNOSIS — I5042 Chronic combined systolic (congestive) and diastolic (congestive) heart failure: Secondary | ICD-10-CM | POA: Diagnosis not present

## 2020-02-02 DIAGNOSIS — I2510 Atherosclerotic heart disease of native coronary artery without angina pectoris: Secondary | ICD-10-CM

## 2020-02-02 DIAGNOSIS — I5022 Chronic systolic (congestive) heart failure: Secondary | ICD-10-CM | POA: Diagnosis not present

## 2020-02-02 DIAGNOSIS — I480 Paroxysmal atrial fibrillation: Secondary | ICD-10-CM

## 2020-02-02 DIAGNOSIS — I493 Ventricular premature depolarization: Secondary | ICD-10-CM

## 2020-02-02 LAB — CUP PACEART INCLINIC DEVICE CHECK
Battery Voltage: 2.64 V
Brady Statistic RV Percent Paced: 0.01 %
Date Time Interrogation Session: 20210629085221
HighPow Impedance: 342 Ohm
HighPow Impedance: 39 Ohm
HighPow Impedance: 49 Ohm
Implantable Lead Implant Date: 20130603
Implantable Lead Location: 753860
Implantable Lead Model: 6947
Implantable Pulse Generator Implant Date: 20130603
Lead Channel Impedance Value: 437 Ohm
Lead Channel Pacing Threshold Amplitude: 0.875 V
Lead Channel Pacing Threshold Pulse Width: 0.4 ms
Lead Channel Sensing Intrinsic Amplitude: 10 mV
Lead Channel Sensing Intrinsic Amplitude: 14.625 mV
Lead Channel Setting Pacing Amplitude: 2.5 V
Lead Channel Setting Pacing Pulse Width: 0.4 ms
Lead Channel Setting Sensing Sensitivity: 0.3 mV

## 2020-02-02 LAB — CUP PACEART REMOTE DEVICE CHECK
Battery Voltage: 2.65 V
Brady Statistic RV Percent Paced: 0.01 %
Date Time Interrogation Session: 20210628033330
HighPow Impedance: 323 Ohm
HighPow Impedance: 37 Ohm
HighPow Impedance: 43 Ohm
Implantable Lead Implant Date: 20130603
Implantable Lead Location: 753860
Implantable Lead Model: 6947
Implantable Pulse Generator Implant Date: 20130603
Lead Channel Impedance Value: 380 Ohm
Lead Channel Pacing Threshold Amplitude: 0.75 V
Lead Channel Pacing Threshold Pulse Width: 0.4 ms
Lead Channel Sensing Intrinsic Amplitude: 9 mV
Lead Channel Sensing Intrinsic Amplitude: 9 mV
Lead Channel Setting Pacing Amplitude: 2.5 V
Lead Channel Setting Pacing Pulse Width: 0.4 ms
Lead Channel Setting Sensing Sensitivity: 0.3 mV

## 2020-02-02 NOTE — Patient Instructions (Addendum)
Medication Instructions:  *If you need a refill on your cardiac medications before your next appointment, please call your pharmacy*  Lab Work: If you have labs (blood work) drawn today and your tests are completely normal, you will receive your results only by: Marland Kitchen MyChart Message (if you have MyChart) OR . A paper copy in the mail If you have any lab test that is abnormal or we need to change your treatment, we will call you to review the results.  Testing/Procedures: None Ordered  Follow-Up: At [facility], you and your health needs are our priority.  As part of our continuing mission to provide you with exceptional heart care, we have created designated Provider Care Teams.  These Care Teams include your primary Cardiologist (physician) and Advanced Practice Providers (APPs -  Physician Assistants and Nurse Practitioners) who all work together to provide you with the care you need, when you need it.  We recommend signing up for the patient portal called "MyChart".  Sign up information is provided on this After Visit Summary.  MyChart is used to connect with patients for Virtual Visits (Telemedicine).  Patients are able to view lab/test results, encounter notes, upcoming appointments, etc.  Non-urgent messages can be sent to your provider as well.   To learn more about what you can do with MyChart, go to [email or web address].    Your next appointment:   Your physician recommends that you schedule virtual visit with Dr. Caryl Comes in 3-4 weeks to discuss device approaching ERI. - 02/25/20 @ 3:30 pm   The format for your next appointment:   Virtual Visit with Virl Axe, MD

## 2020-02-03 NOTE — Progress Notes (Signed)
Remote ICD transmission.   

## 2020-02-03 NOTE — Addendum Note (Signed)
Addended by: Douglass Rivers D on: 02/03/2020 04:40 PM   Modules accepted: Level of Service
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
## 2020-02-21 ENCOUNTER — Other Ambulatory Visit (HOSPITAL_COMMUNITY): Payer: Self-pay | Admitting: Cardiology

## 2020-02-21 DIAGNOSIS — I5042 Chronic combined systolic (congestive) and diastolic (congestive) heart failure: Secondary | ICD-10-CM

## 2020-02-22 ENCOUNTER — Encounter (INDEPENDENT_AMBULATORY_CARE_PROVIDER_SITE_OTHER): Payer: Self-pay | Admitting: Ophthalmology

## 2020-02-22 ENCOUNTER — Ambulatory Visit (INDEPENDENT_AMBULATORY_CARE_PROVIDER_SITE_OTHER): Payer: 59 | Admitting: Ophthalmology

## 2020-02-22 ENCOUNTER — Other Ambulatory Visit: Payer: Self-pay

## 2020-02-22 ENCOUNTER — Other Ambulatory Visit (HOSPITAL_COMMUNITY): Payer: Self-pay | Admitting: Cardiology

## 2020-02-22 DIAGNOSIS — E113512 Type 2 diabetes mellitus with proliferative diabetic retinopathy with macular edema, left eye: Secondary | ICD-10-CM | POA: Diagnosis not present

## 2020-02-22 DIAGNOSIS — H25813 Combined forms of age-related cataract, bilateral: Secondary | ICD-10-CM

## 2020-02-22 DIAGNOSIS — E113411 Type 2 diabetes mellitus with severe nonproliferative diabetic retinopathy with macular edema, right eye: Secondary | ICD-10-CM | POA: Diagnosis not present

## 2020-02-22 DIAGNOSIS — H3581 Retinal edema: Secondary | ICD-10-CM | POA: Diagnosis not present

## 2020-02-22 DIAGNOSIS — H4312 Vitreous hemorrhage, left eye: Secondary | ICD-10-CM | POA: Diagnosis not present

## 2020-02-22 DIAGNOSIS — H35033 Hypertensive retinopathy, bilateral: Secondary | ICD-10-CM

## 2020-02-22 DIAGNOSIS — I10 Essential (primary) hypertension: Secondary | ICD-10-CM

## 2020-02-25 ENCOUNTER — Telehealth: Payer: Self-pay

## 2020-02-25 ENCOUNTER — Other Ambulatory Visit: Payer: Self-pay

## 2020-02-25 ENCOUNTER — Telehealth (INDEPENDENT_AMBULATORY_CARE_PROVIDER_SITE_OTHER): Payer: 59 | Admitting: Internal Medicine

## 2020-02-25 VITALS — Ht 70.5 in | Wt 205.0 lb

## 2020-02-25 DIAGNOSIS — Z95810 Presence of automatic (implantable) cardiac defibrillator: Secondary | ICD-10-CM

## 2020-02-25 DIAGNOSIS — I255 Ischemic cardiomyopathy: Secondary | ICD-10-CM | POA: Diagnosis not present

## 2020-02-25 DIAGNOSIS — I5042 Chronic combined systolic (congestive) and diastolic (congestive) heart failure: Secondary | ICD-10-CM

## 2020-02-25 NOTE — Telephone Encounter (Signed)
  Patient Consent for Virtual Visit         Marcus Johnson. has provided verbal consent on 02/25/2020 for a virtual visit (video or telephone).   CONSENT FOR VIRTUAL VISIT FOR:  Marcus Johnson.  By participating in this virtual visit I agree to the following:  I hereby voluntarily request, consent and authorize [facility] and its employed or contracted physicians, physician assistants, nurse practitioners or other licensed health care professionals (the Practitioner), to provide me with telemedicine health care services (the "Services") as deemed necessary by the treating Practitioner. I acknowledge and consent to receive the Services by the Practitioner via telemedicine. I understand that the telemedicine visit will involve communicating with the Practitioner through live audiovisual communication technology and the disclosure of certain medical information by electronic transmission. I acknowledge that I have been given the opportunity to request an in-person assessment or other available alternative prior to the telemedicine visit and am voluntarily participating in the telemedicine visit.  I understand that I have the right to withhold or withdraw my consent to the use of telemedicine in the course of my care at any time, without affecting my right to future care or treatment, and that the Practitioner or I may terminate the telemedicine visit at any time. I understand that I have the right to inspect all information obtained and/or recorded in the course of the telemedicine visit and may receive copies of available information for a reasonable fee.  I understand that some of the potential risks of receiving the Services via telemedicine include:  Marland Kitchen Delay or interruption in medical evaluation due to technological equipment failure or disruption; . Information transmitted may not be sufficient (e.g. poor resolution of images) to allow for appropriate medical decision making by  the Practitioner; and/or  . In rare instances, security protocols could fail, causing a breach of personal health information.  Furthermore, I acknowledge that it is my responsibility to provide information about my medical history, conditions and care that is complete and accurate to the best of my ability. I acknowledge that Practitioner's advice, recommendations, and/or decision may be based on factors not within their control, such as incomplete or inaccurate data provided by me or distortions of diagnostic images or specimens that may result from electronic transmissions. I understand that the practice of medicine is not an exact science and that Practitioner makes no warranties or guarantees regarding treatment outcomes. I acknowledge that a copy of this consent can be made available to me via my patient portal ([facility]), or I can request a printed copy by calling the office of [facility].    I understand that my insurance will be billed for this visit.   I have read or had this consent read to me. . I understand the contents of this consent, which adequately explains the benefits and risks of the Services being provided via telemedicine.  . I have been provided ample opportunity to ask questions regarding this consent and the Services and have had my questions answered to my satisfaction. . I give my informed consent for the services to be provided through the use of telemedicine in my medical care
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
## 2020-03-02 ENCOUNTER — Ambulatory Visit (INDEPENDENT_AMBULATORY_CARE_PROVIDER_SITE_OTHER): Payer: 59 | Admitting: *Deleted

## 2020-03-02 DIAGNOSIS — I255 Ischemic cardiomyopathy: Secondary | ICD-10-CM

## 2020-03-02 LAB — CUP PACEART REMOTE DEVICE CHECK
Battery Voltage: 2.64 V
Brady Statistic RV Percent Paced: 0.01 %
Date Time Interrogation Session: 20210728001607
HighPow Impedance: 323 Ohm
HighPow Impedance: 36 Ohm
HighPow Impedance: 44 Ohm
Implantable Lead Implant Date: 20130603
Implantable Lead Location: 753860
Implantable Lead Model: 6947
Implantable Pulse Generator Implant Date: 20130603
Lead Channel Impedance Value: 342 Ohm
Lead Channel Pacing Threshold Amplitude: 0.75 V
Lead Channel Pacing Threshold Pulse Width: 0.4 ms
Lead Channel Sensing Intrinsic Amplitude: 9.75 mV
Lead Channel Sensing Intrinsic Amplitude: 9.75 mV
Lead Channel Setting Pacing Amplitude: 2.5 V
Lead Channel Setting Pacing Pulse Width: 0.4 ms
Lead Channel Setting Sensing Sensitivity: 0.3 mV

## 2020-03-04 ENCOUNTER — Telehealth (HOSPITAL_COMMUNITY): Payer: Self-pay | Admitting: Cardiology

## 2020-03-04 NOTE — Progress Notes (Signed)
Remote ICD transmission.   

## 2020-03-04 NOTE — Telephone Encounter (Signed)
Medical clearance completed by Dr Aundra Dubin Patient is approved to proceed with ophthalmic retinal surgery under general anesthesia   from a cardiac standpoint. Moderate risk for complication with significant heart diease  Medication prep- ok to hold eliquis prior to procedure    [facility] Attn Dr Coralyn Pear Fax # [phone]

## 2020-03-07 ENCOUNTER — Other Ambulatory Visit (HOSPITAL_COMMUNITY)
Admission: RE | Admit: 2020-03-07 | Discharge: 2020-03-07 | Disposition: A | Discharge status: Home / Self Care | Payer: 59 | Source: Ambulatory Visit | Attending: Ophthalmology | Admitting: Ophthalmology

## 2020-03-07 ENCOUNTER — Encounter (INDEPENDENT_AMBULATORY_CARE_PROVIDER_SITE_OTHER): Payer: Self-pay | Admitting: Ophthalmology

## 2020-03-07 ENCOUNTER — Other Ambulatory Visit: Payer: Self-pay

## 2020-03-07 ENCOUNTER — Ambulatory Visit (INDEPENDENT_AMBULATORY_CARE_PROVIDER_SITE_OTHER): Payer: 59 | Admitting: Ophthalmology

## 2020-03-07 DIAGNOSIS — H3581 Retinal edema: Secondary | ICD-10-CM | POA: Diagnosis not present

## 2020-03-07 DIAGNOSIS — E113512 Type 2 diabetes mellitus with proliferative diabetic retinopathy with macular edema, left eye: Secondary | ICD-10-CM

## 2020-03-07 DIAGNOSIS — H35033 Hypertensive retinopathy, bilateral: Secondary | ICD-10-CM

## 2020-03-07 DIAGNOSIS — I10 Essential (primary) hypertension: Secondary | ICD-10-CM

## 2020-03-07 DIAGNOSIS — Z20822 Contact with and (suspected) exposure to covid-19: Secondary | ICD-10-CM | POA: Insufficient documentation

## 2020-03-07 DIAGNOSIS — Z01812 Encounter for preprocedural laboratory examination: Secondary | ICD-10-CM | POA: Insufficient documentation

## 2020-03-07 DIAGNOSIS — H4312 Vitreous hemorrhage, left eye: Secondary | ICD-10-CM

## 2020-03-07 DIAGNOSIS — H25813 Combined forms of age-related cataract, bilateral: Secondary | ICD-10-CM

## 2020-03-07 DIAGNOSIS — E113411 Type 2 diabetes mellitus with severe nonproliferative diabetic retinopathy with macular edema, right eye: Secondary | ICD-10-CM

## 2020-03-07 LAB — SARS CORONAVIRUS 2 (TAT 6-24 HRS): SARS Coronavirus 2: NEGATIVE

## 2020-03-07 MED ORDER — BEVACIZUMAB CHEMO INJECTION 1.25MG/0.05ML SYRINGE FOR KALEIDOSCOPE
1.2500 mg | INTRAVITREAL | Status: AC | PRN
  Administered 2020-03-07: 1.25 mg via INTRAVITREAL
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
## 2020-03-09 ENCOUNTER — Other Ambulatory Visit: Payer: Self-pay

## 2020-03-09 ENCOUNTER — Encounter: Payer: Self-pay | Admitting: Internal Medicine

## 2020-03-09 ENCOUNTER — Encounter (HOSPITAL_COMMUNITY): Payer: Self-pay | Admitting: Ophthalmology

## 2020-03-09 NOTE — Progress Notes (Signed)
PERIOPERATIVE PRESCRIPTION FOR IMPLANTED CARDIAC DEVICE PROGRAMMING  Patient Information: Name:  Marcus Johnson.  DOB:  [birth year]  MRN:  [id number]    Planned Procedure: 25 gauge pars plana vitrectomy with membrane peel, endolaser, and gas versus silicon oil, LEFT EYE   Surgeon: [name], MD, PhD  Date of Procedure: 03/10/20  Cautery will be used.  Position during surgery:   Please send documentation back to:  [facility] (Fax # [phone])   Catalina Pizza, RN  03/09/2020 9:42 AM   Device Information:  Clinic EP Physician:  Virl Axe, MD   Device Type:  Defibrillator Manufacturer and Phone #:  [employer]: [phone] Pacemaker Dependent?:  No. Date of Last Device Check:  03/02/20 (remote) 02/02/20 (in-clinic) Normal Device Function?:  Yes.    Electrophysiologist's Recommendations:   Have magnet available.  Provide continuous ECG monitoring when magnet is used or reprogramming is to be performed.   Procedure may interfere with device function.  Magnet should be placed over device during procedure.  Per Device Clinic Standing Orders, York Ram, RN  1:25 PM 03/09/2020

## 2020-03-09 NOTE — Progress Notes (Signed)
Pt denies SOB and chest pain. Pt stated that he is under the care of Dr. Sharlene Motts, PCP, Dr. Aundra Dubin, Cardiology and Dr. Caryl Comes, Cardiology (EP). Pt stated that his last dose of Eliquis was " Sunday" as instructed by MD. Pt made aware to stop taking vitamins, fish oil and herbal medications. Do not take any NSAIDs ie: Ibuprofen, Advil, Naproxen (Aleve), Motrin, BC and Goody Powder. Pt denies having a chest x ray. Pt denies having recent labs (pt denies recent A1c ).  Pt stated that his FBG ranges from 210-250. Pt stated that he had taken his [place] today. Pt made aware to hold [place] on DOS. Pt made aware to take 25 units of Lantus insulin at HS. Pt made aware to check CBG every 2 hours prior to arrival to hospital on DOS. Pt made aware to treat a CBG < 70 with 4 ounces of apple or cranberry juice, wait 15 minutes after intervention to recheck CBG, if CBG remains < 70, call Short Stay unit to speak with a nurse. Pt made aware to take 1/2 correction dose of SS for a CBG >220. Pt reminded to quarantine. Pt verbalized understanding of all pre-op instructions. Nurse spoke with Gae Dry, [employer] Rep. regarding per-op orders for ICD. Rep stated that he had already received the orders from [facility].  PA, Anesthesiology, asked to review pt history.

## 2020-03-09 NOTE — Anesthesia Preprocedure Evaluation (Addendum)
Anesthesia Evaluation  Patient identified by MRN, date of birth, ID band Patient awake    Reviewed: Allergy & Precautions, NPO status , Patient's Chart, lab work & pertinent test results  Airway Mallampati: II  TM Distance: >3 FB Neck ROM: Full    Dental  (+) Dental Advisory Given   Pulmonary shortness of breath,    breath sounds clear to auscultation       Cardiovascular hypertension, Pt. on medications and Pt. on home beta blockers + CAD, + Past MI and +CHF  + Cardiac Defibrillator  Rhythm:Regular Rate:Normal     Neuro/Psych negative neurological ROS     GI/Hepatic negative GI ROS, Neg liver ROS,   Endo/Other  diabetes, Type 2, Insulin Dependent  Renal/GU CRFRenal disease     Musculoskeletal   Abdominal   Peds  Hematology negative hematology ROS (+)   Anesthesia Other Findings   Reproductive/Obstetrics                           Anesthesia Physical Anesthesia Plan  ASA: IV  Anesthesia Plan: General   Post-op Pain Management:    Induction: Intravenous  PONV Risk Score and Plan: 2 and Dexamethasone, Ondansetron and Treatment may vary due to age or medical condition  Airway Management Planned: Oral ETT  Additional Equipment:   Intra-op Plan:   Post-operative Plan: Extubation in OR  Informed Consent: I have reviewed the patients History and Physical, chart, labs and discussed the procedure including the risks, benefits and alternatives for the proposed anesthesia with the patient or authorized representative who has indicated his/her understanding and acceptance.     Dental advisory given  Plan Discussed with: CRNA  Anesthesia Plan Comments: (PAT note by Karoline Caldwell, PA-C: Pt follows with EP and HF cardiology for ICD (medtronic) for primary prevention for ICM w remote CABG 2002, aflutter s/p DCCV on Eliquis, HTN. Echo in 6/19 showed EF 20% with severe global hypokinesis.   LHC/RHC showed severe native vessel CAD with patent LIMA-D, SVG-OM, and SVG-PLV. Echo in 4/21 showed EF 25% with apical akinesis and diffuse hypokinesis relatively preserved in lateral wal, mildly decreased RV systolic function, PASP 41 mmHg.  CPX in 5/21 showed severe functional limitation suggestive of advanced HF. Last seen by Dr. Caryl Comes 02/25/20, per note, "Since last being seen in our clinic for ICD for primary prevention for ICM w remote CABG, the patient reports doing pretty well. Able to climb 3 flights of stairs.  No PND no Edema on diuretics."  Pt previously offered LVAD and barostim but he is not interested.   CKD III, baseline creatinine 1.7-2.0.  Uncontrolled IDDMII, last A1c 15.7.  Will need DOS labs and eval.   EKG 02/02/2020: Sinus rhythm.  Rate 77.  Incomplete left bundle branch block.  Periop device orders: Device Information:  Clinic EP Physician:  Virl Axe, MD   Device Type:  Defibrillator Manufacturer and Phone #:  Medtronic: [phone] Pacemaker Dependent?:  No. Date of Last Device Check:  03/02/20 (remote) 02/02/20 (in-clinic)   Normal Device Function?:  Yes.    Electrophysiologist's Recommendations:  Have magnet available. Provide continuous ECG monitoring when magnet is used or reprogramming is to be performed.  Procedure may interfere with device function.  Magnet should be placed over device during procedure.   Event monitor 01/01/20: 1. Predominantly NSR.  2. 1 short episode of NSVT 3. 3.8% PVCs 4. Rare, short runs of atrial fibrillation versus atrial flutter.   CPX  test 12/14/19: Conclusion: Exercise testing with gas exchange demonstrates severe functional impairment when compared to matched sedentary norms. Patient has a severe HF limitation with multiple high risk factors, all independently predicting worsening short-term prognosis, specifically elevated VE/VCO2 slope and Blunted BP response. There is also a mild to moderate restrictive pattern  noted with spirometry. There was chronotropic incompetence and blunted BP response.   Attending: Severe functional limitation due primarily to advanced HF with flat BP response to exercise. Consideration should be given to evaluation for advanced therapies, if clinically indicated.   TTE 11/25/19: 1. Left ventricular ejection fraction, by estimation, is 25%. The left  ventricle demonstrates akinesis of the apex and peri-apical segments with  relative preservation of the lateral wall. The left ventricular internal  cavity size was mildly dilated.  There is mild left ventricular hypertrophy. Left ventricular diastolic  parameters are consistent with Grade II diastolic dysfunction  (pseudonormalization). Elevated left ventricular end-diastolic pressure.  2. Right ventricular systolic function is mildly reduced. The right  ventricular size is normal. The estimated right ventricular systolic  pressure is 74.8 mmHg.  3. Left atrial size was mildly dilated.  4. The mitral valve is normal in structure. Trivial mitral valve  regurgitation. No evidence of mitral stenosis.  5. The aortic valve is tricuspid. Aortic valve regurgitation is not  visualized. No aortic stenosis is present.  6. The inferior vena cava is dilated in size with >50% respiratory  variability, suggesting right atrial pressure of 8 mmHg.   R/LHC 01/10/18: 1. Severe native vessel CAD.  Patent bypass grafts though the LIMA appears to touchdown on a large diagonal rather than the LAD.  2. Elevated filling pressures.  3. RV dysfunction present but does not appear marked (PAPi 2).  4. Low cardiac output.   )       Anesthesia Quick Evaluation

## 2020-03-09 NOTE — Progress Notes (Signed)
Anesthesia Chart Review:  Pt follows with EP and HF cardiology for ICD (medtronic) for primary prevention for ICM w remote CABG 2002, aflutter s/p DCCV on Eliquis, HTN. Echo in 6/19 showed EF 20% with severe global hypokinesis.  LHC/RHC showed severe native vessel CAD with patent LIMA-D, SVG-OM, and SVG-PLV. Echo in 4/21 showed EF 25% with apical akinesis and diffuse hypokinesis relatively preserved in lateral wal, mildly decreased RV systolic function, PASP 41 mmHg.  CPX in 5/21 showed severe functional limitation suggestive of advanced HF. Last seen by Dr. Caryl Comes 02/25/20, per note, "Since last being seen in our clinic for ICD for primary prevention for ICM w remote CABG, the patient reports doing pretty well. Able to climb 3 flights of stairs.  No PND no Edema on diuretics."  Pt previously offered LVAD and barostim but he is not interested.   CKD III, baseline creatinine 1.7-2.0.  Uncontrolled IDDMII, last A1c 15.7.  Will need DOS labs and eval.   EKG 02/02/2020: Sinus rhythm.  Rate 77.  Incomplete left bundle branch block.  Periop device orders: Device Information:  Clinic EP Physician:  Virl Axe, MD   Device Type:  Defibrillator Manufacturer and Phone #:  Medtronic: [phone] Pacemaker Dependent?:  No. Date of Last Device Check:  03/02/20 (remote) 02/02/20 (in-clinic)   Normal Device Function?:  Yes.    Electrophysiologist's Recommendations:   Have magnet available.  Provide continuous ECG monitoring when magnet is used or reprogramming is to be performed.   Procedure may interfere with device function.  Magnet should be placed over device during procedure.   Event monitor 01/01/20: 1. Predominantly NSR.  2. 1 short episode of NSVT 3. 3.8% PVCs 4. Rare, short runs of atrial fibrillation versus atrial flutter.   CPX test 12/14/19: Conclusion: Exercise testing with gas exchange demonstrates severe functional impairment when compared to matched sedentary norms.  Patient has a severe HF limitation with multiple high risk factors, all independently predicting worsening short-term prognosis, specifically elevated VE/VCO2 slope and Blunted BP response. There is also a mild to moderate restrictive pattern noted with spirometry. There was chronotropic incompetence and blunted BP response.   Attending: Severe functional limitation due primarily to advanced HF with flat BP response to exercise. Consideration should be given to evaluation for advanced therapies, if clinically indicated.   TTE 11/25/19: 1. Left ventricular ejection fraction, by estimation, is 25%. The left  ventricle demonstrates akinesis of the apex and peri-apical segments with  relative preservation of the lateral wall. The left ventricular internal  cavity size was mildly dilated.  There is mild left ventricular hypertrophy. Left ventricular diastolic  parameters are consistent with Grade II diastolic dysfunction  (pseudonormalization). Elevated left ventricular end-diastolic pressure.  2. Right ventricular systolic function is mildly reduced. The right  ventricular size is normal. The estimated right ventricular systolic  pressure is 67.2 mmHg.  3. Left atrial size was mildly dilated.  4. The mitral valve is normal in structure. Trivial mitral valve  regurgitation. No evidence of mitral stenosis.  5. The aortic valve is tricuspid. Aortic valve regurgitation is not  visualized. No aortic stenosis is present.  6. The inferior vena cava is dilated in size with >50% respiratory  variability, suggesting right atrial pressure of 8 mmHg.   R/LHC 01/10/18: 1. Severe native vessel CAD.  Patent bypass grafts though the LIMA appears to touchdown on a large diagonal rather than the LAD.  2. Elevated filling pressures.  3. RV dysfunction present but does not appear marked (  PAPi 2).  4. Low cardiac output.    Wynonia Musty [facility] Short Stay Center/Anesthesiology Phone [phone] 03/09/2020 3:57 PM
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
## 2020-03-10 ENCOUNTER — Ambulatory Visit (HOSPITAL_COMMUNITY): Payer: 59 | Admitting: Physician Assistant

## 2020-03-10 ENCOUNTER — Encounter (HOSPITAL_COMMUNITY): Payer: Self-pay | Admitting: Ophthalmology

## 2020-03-10 ENCOUNTER — Ambulatory Visit (HOSPITAL_COMMUNITY)
Admission: RE | Admit: 2020-03-10 | Discharge: 2020-03-10 | Disposition: A | Discharge status: Home / Self Care | Payer: 59 | Attending: Ophthalmology | Admitting: Ophthalmology

## 2020-03-10 ENCOUNTER — Encounter (HOSPITAL_COMMUNITY)
Admission: RE | Disposition: A | Discharge status: Home / Self Care | Payer: Self-pay | Source: Home / Self Care | Attending: Ophthalmology

## 2020-03-10 DIAGNOSIS — R0602 Shortness of breath: Secondary | ICD-10-CM | POA: Insufficient documentation

## 2020-03-10 DIAGNOSIS — M109 Gout, unspecified: Secondary | ICD-10-CM | POA: Insufficient documentation

## 2020-03-10 DIAGNOSIS — I252 Old myocardial infarction: Secondary | ICD-10-CM | POA: Insufficient documentation

## 2020-03-10 DIAGNOSIS — I2510 Atherosclerotic heart disease of native coronary artery without angina pectoris: Secondary | ICD-10-CM | POA: Insufficient documentation

## 2020-03-10 DIAGNOSIS — Z8614 Personal history of Methicillin resistant Staphylococcus aureus infection: Secondary | ICD-10-CM | POA: Diagnosis not present

## 2020-03-10 DIAGNOSIS — Z951 Presence of aortocoronary bypass graft: Secondary | ICD-10-CM | POA: Diagnosis not present

## 2020-03-10 DIAGNOSIS — H43822 Vitreomacular adhesion, left eye: Secondary | ICD-10-CM | POA: Insufficient documentation

## 2020-03-10 DIAGNOSIS — I5022 Chronic systolic (congestive) heart failure: Secondary | ICD-10-CM | POA: Insufficient documentation

## 2020-03-10 DIAGNOSIS — H4312 Vitreous hemorrhage, left eye: Secondary | ICD-10-CM | POA: Insufficient documentation

## 2020-03-10 DIAGNOSIS — I255 Ischemic cardiomyopathy: Secondary | ICD-10-CM | POA: Insufficient documentation

## 2020-03-10 DIAGNOSIS — Z833 Family history of diabetes mellitus: Secondary | ICD-10-CM | POA: Diagnosis not present

## 2020-03-10 DIAGNOSIS — Z794 Long term (current) use of insulin: Secondary | ICD-10-CM | POA: Diagnosis not present

## 2020-03-10 DIAGNOSIS — Z888 Allergy status to other drugs, medicaments and biological substances status: Secondary | ICD-10-CM | POA: Diagnosis not present

## 2020-03-10 DIAGNOSIS — I110 Hypertensive heart disease with heart failure: Secondary | ICD-10-CM | POA: Diagnosis not present

## 2020-03-10 DIAGNOSIS — E1142 Type 2 diabetes mellitus with diabetic polyneuropathy: Secondary | ICD-10-CM | POA: Insufficient documentation

## 2020-03-10 DIAGNOSIS — E113592 Type 2 diabetes mellitus with proliferative diabetic retinopathy without macular edema, left eye: Secondary | ICD-10-CM | POA: Diagnosis present

## 2020-03-10 DIAGNOSIS — H35372 Puckering of macula, left eye: Secondary | ICD-10-CM

## 2020-03-10 DIAGNOSIS — Z8249 Family history of ischemic heart disease and other diseases of the circulatory system: Secondary | ICD-10-CM | POA: Insufficient documentation

## 2020-03-10 DIAGNOSIS — E113512 Type 2 diabetes mellitus with proliferative diabetic retinopathy with macular edema, left eye: Secondary | ICD-10-CM | POA: Diagnosis not present

## 2020-03-10 DIAGNOSIS — Z95810 Presence of automatic (implantable) cardiac defibrillator: Secondary | ICD-10-CM | POA: Diagnosis not present

## 2020-03-10 HISTORY — DX: Vitreous hemorrhage, left eye: H43.12

## 2020-03-10 HISTORY — PX: GAS INSERTION: SHX5336

## 2020-03-10 HISTORY — PX: EYE SURGERY: SHX253

## 2020-03-10 HISTORY — PX: PHOTOCOAGULATION WITH LASER: SHX6027

## 2020-03-10 HISTORY — PX: PARS PLANA VITRECTOMY: SHX2166

## 2020-03-10 HISTORY — PX: GAS/FLUID EXCHANGE: SHX5334

## 2020-03-10 LAB — GLUCOSE, CAPILLARY
Glucose-Capillary: 160 mg/dL — ABNORMAL HIGH (ref 70–99)
Glucose-Capillary: 151 mg/dL — ABNORMAL HIGH (ref 70–99)
Glucose-Capillary: 124 mg/dL — ABNORMAL HIGH (ref 70–99)

## 2020-03-10 LAB — SURGICAL PCR SCREEN
MRSA, PCR: NEGATIVE
Staphylococcus aureus: NEGATIVE

## 2020-03-10 SURGERY — PARS PLANA VITRECTOMY WITH 25 GAUGE
Anesthesia: General | Site: Eye | Laterality: Left

## 2020-03-10 MED ORDER — POLYMYXIN B SULFATE 500000 UNITS IJ SOLR
INTRAMUSCULAR | Status: AC
  Filled 2020-03-10: qty 500000

## 2020-03-10 MED ORDER — SODIUM CHLORIDE 0.9 % IV SOLN: INTRAVENOUS | Status: DC

## 2020-03-10 MED ORDER — ONDANSETRON HCL 4 MG/2ML IJ SOLN
INTRAMUSCULAR | Status: DC | PRN
  Administered 2020-03-10: 4 mg via INTRAVENOUS

## 2020-03-10 MED ORDER — NA CHONDROIT SULF-NA HYALURON 40-30 MG/ML IO SOLN
INTRAOCULAR | Status: DC | PRN
  Administered 2020-03-10: 0.5 mL via INTRAOCULAR

## 2020-03-10 MED ORDER — EPHEDRINE SULFATE-NACL 50-0.9 MG/10ML-% IV SOSY
PREFILLED_SYRINGE | INTRAVENOUS | Status: DC | PRN
  Administered 2020-03-10: 20 mg via INTRAVENOUS
  Administered 2020-03-10: 10 mg via INTRAVENOUS

## 2020-03-10 MED ORDER — SUGAMMADEX SODIUM 200 MG/2ML IV SOLN
INTRAVENOUS | Status: DC | PRN
  Administered 2020-03-10: 200 mg via INTRAVENOUS

## 2020-03-10 MED ORDER — FENTANYL CITRATE (PF) 100 MCG/2ML IJ SOLN: 25.0000 ug | INTRAMUSCULAR | Status: DC | PRN

## 2020-03-10 MED ORDER — FENTANYL CITRATE (PF) 250 MCG/5ML IJ SOLN
INTRAMUSCULAR | Status: DC | PRN
  Administered 2020-03-10 (×5): 25 ug via INTRAVENOUS
  Administered 2020-03-10: 50 ug via INTRAVENOUS

## 2020-03-10 MED ORDER — ATROPINE SULFATE 1 % OP SOLN
1.0000 [drp] | OPHTHALMIC | Status: AC | PRN
  Administered 2020-03-10 (×3): 1 [drp] via OPHTHALMIC
  Filled 2020-03-10: qty 5

## 2020-03-10 MED ORDER — CHLORHEXIDINE GLUCONATE 0.12 % MT SOLN
15.0000 mL | Freq: Once | OROMUCOSAL | Status: AC
  Administered 2020-03-10: 15 mL via OROMUCOSAL
  Filled 2020-03-10: qty 15

## 2020-03-10 MED ORDER — BUPIVACAINE HCL (PF) 0.75 % IJ SOLN
INTRAMUSCULAR | Status: AC
  Filled 2020-03-10: qty 10

## 2020-03-10 MED ORDER — NA CHONDROIT SULF-NA HYALURON 40-30 MG/ML IO SOLN
INTRAOCULAR | Status: AC
  Filled 2020-03-10: qty 0.5

## 2020-03-10 MED ORDER — BRIMONIDINE TARTRATE 0.2 % OP SOLN
OPHTHALMIC | Status: AC
  Filled 2020-03-10: qty 5

## 2020-03-10 MED ORDER — GATIFLOXACIN 0.5 % OP SOLN
OPHTHALMIC | Status: DC | PRN
  Administered 2020-03-10: 1 [drp] via OPHTHALMIC

## 2020-03-10 MED ORDER — PREDNISOLONE ACETATE 1 % OP SUSP
OPHTHALMIC | Status: DC | PRN
  Administered 2020-03-10: 1 [drp] via OPHTHALMIC

## 2020-03-10 MED ORDER — BACITRACIN-POLYMYXIN B 500-10000 UNIT/GM OP OINT
TOPICAL_OINTMENT | OPHTHALMIC | Status: DC | PRN
  Administered 2020-03-10: 1 via OPHTHALMIC

## 2020-03-10 MED ORDER — DEXTROSE 5 % IV SOLN
INTRAVENOUS | Status: AC
  Filled 2020-03-10: qty 100

## 2020-03-10 MED ORDER — BACITRACIN-POLYMYXIN B 500-10000 UNIT/GM OP OINT
TOPICAL_OINTMENT | OPHTHALMIC | Status: AC
  Filled 2020-03-10: qty 3.5

## 2020-03-10 MED ORDER — BSS IO SOLN
INTRAOCULAR | Status: AC
  Filled 2020-03-10: qty 15

## 2020-03-10 MED ORDER — DEXAMETHASONE SODIUM PHOSPHATE 10 MG/ML IJ SOLN
INTRAMUSCULAR | Status: AC
  Filled 2020-03-10: qty 1

## 2020-03-10 MED ORDER — MIDAZOLAM HCL 5 MG/5ML IJ SOLN
INTRAMUSCULAR | Status: DC | PRN
  Administered 2020-03-10: 2 mg via INTRAVENOUS

## 2020-03-10 MED ORDER — CARBACHOL 0.01 % IO SOLN
INTRAOCULAR | Status: AC
  Filled 2020-03-10: qty 1.5

## 2020-03-10 MED ORDER — LACTATED RINGERS IV SOLN: INTRAVENOUS | Status: DC | PRN

## 2020-03-10 MED ORDER — PROPOFOL 10 MG/ML IV BOLUS
INTRAVENOUS | Status: AC
  Filled 2020-03-10: qty 20

## 2020-03-10 MED ORDER — STERILE WATER FOR INJECTION IJ SOLN
INTRAMUSCULAR | Status: AC
  Filled 2020-03-10: qty 10

## 2020-03-10 MED ORDER — DEXAMETHASONE SODIUM PHOSPHATE 10 MG/ML IJ SOLN
INTRAMUSCULAR | Status: DC | PRN
  Administered 2020-03-10: 4 mg via INTRAVENOUS

## 2020-03-10 MED ORDER — PROPOFOL 10 MG/ML IV BOLUS
INTRAVENOUS | Status: DC | PRN
  Administered 2020-03-10: 100 mg via INTRAVENOUS

## 2020-03-10 MED ORDER — STERILE WATER FOR INJECTION IJ SOLN
INTRAMUSCULAR | Status: DC | PRN
  Administered 2020-03-10: 20 mL

## 2020-03-10 MED ORDER — CEFTAZIDIME 1 G IJ SOLR
INTRAMUSCULAR | Status: AC
  Filled 2020-03-10: qty 1

## 2020-03-10 MED ORDER — LIDOCAINE HCL (PF) 4 % IJ SOLN
INTRAMUSCULAR | Status: AC
  Filled 2020-03-10: qty 5

## 2020-03-10 MED ORDER — ORAL CARE MOUTH RINSE: 15.0000 mL | Freq: Once | OROMUCOSAL | Status: AC

## 2020-03-10 MED ORDER — EPINEPHRINE PF 1 MG/ML IJ SOLN
INTRAOCULAR | Status: DC | PRN
  Administered 2020-03-10: 500 mL

## 2020-03-10 MED ORDER — BSS PLUS IO SOLN
INTRAOCULAR | Status: AC
  Filled 2020-03-10: qty 500

## 2020-03-10 MED ORDER — AMISULPRIDE (ANTIEMETIC) 5 MG/2ML IV SOLN: 10.0000 mg | Freq: Once | INTRAVENOUS | Status: DC | PRN

## 2020-03-10 MED ORDER — BRIMONIDINE TARTRATE 0.2 % OP SOLN
OPHTHALMIC | Status: DC | PRN
  Administered 2020-03-10: 1 [drp] via OPHTHALMIC

## 2020-03-10 MED ORDER — DORZOLAMIDE HCL-TIMOLOL MAL 2-0.5 % OP SOLN
OPHTHALMIC | Status: AC
  Filled 2020-03-10: qty 10

## 2020-03-10 MED ORDER — ACETAZOLAMIDE SODIUM 500 MG IJ SOLR
INTRAMUSCULAR | Status: AC
  Filled 2020-03-10: qty 500

## 2020-03-10 MED ORDER — MIDAZOLAM HCL 2 MG/2ML IJ SOLN
INTRAMUSCULAR | Status: AC
  Filled 2020-03-10: qty 2

## 2020-03-10 MED ORDER — ATROPINE SULFATE 1 % OP SOLN
OPHTHALMIC | Status: AC
  Filled 2020-03-10: qty 5

## 2020-03-10 MED ORDER — GATIFLOXACIN 0.5 % OP SOLN
OPHTHALMIC | Status: AC
  Filled 2020-03-10: qty 2.5

## 2020-03-10 MED ORDER — TRIAMCINOLONE ACETONIDE 40 MG/ML IJ SUSP
INTRAMUSCULAR | Status: AC
  Filled 2020-03-10: qty 5

## 2020-03-10 MED ORDER — DORZOLAMIDE HCL-TIMOLOL MAL 2-0.5 % OP SOLN
OPHTHALMIC | Status: DC | PRN
  Administered 2020-03-10: 1 [drp] via OPHTHALMIC

## 2020-03-10 MED ORDER — LIDOCAINE 2% (20 MG/ML) 5 ML SYRINGE
INTRAMUSCULAR | Status: DC | PRN
  Administered 2020-03-10: 60 mg via INTRAVENOUS

## 2020-03-10 MED ORDER — TOBRAMYCIN-DEXAMETHASONE 0.3-0.1 % OP OINT
TOPICAL_OINTMENT | OPHTHALMIC | Status: AC
  Filled 2020-03-10: qty 3.5

## 2020-03-10 MED ORDER — LIDOCAINE HCL (PF) 2 % IJ SOLN
INTRAMUSCULAR | Status: AC
  Filled 2020-03-10: qty 10

## 2020-03-10 MED ORDER — FENTANYL CITRATE (PF) 250 MCG/5ML IJ SOLN
INTRAMUSCULAR | Status: AC
  Filled 2020-03-10: qty 5

## 2020-03-10 MED ORDER — LIDOCAINE HCL 2 % IJ SOLN
INTRAMUSCULAR | Status: AC
  Filled 2020-03-10: qty 20

## 2020-03-10 MED ORDER — HYALURONIDASE HUMAN 150 UNIT/ML IJ SOLN
INTRAMUSCULAR | Status: AC
  Filled 2020-03-10: qty 1

## 2020-03-10 MED ORDER — TRIAMCINOLONE ACETONIDE 40 MG/ML IJ SUSP
INTRAMUSCULAR | Status: DC | PRN
  Administered 2020-03-10: 20 mg

## 2020-03-10 MED ORDER — EPINEPHRINE PF 1 MG/ML IJ SOLN
INTRAMUSCULAR | Status: AC
  Filled 2020-03-10: qty 1

## 2020-03-10 MED ORDER — INDOCYANINE GREEN 25 MG IV SOLR
INTRAVENOUS | Status: AC
  Filled 2020-03-10: qty 10

## 2020-03-10 MED ORDER — PHENYLEPHRINE HCL-NACL 10-0.9 MG/250ML-% IV SOLN
INTRAVENOUS | Status: DC | PRN
Start: 2020-03-10 — End: 2020-03-10
  Administered 2020-03-10: 30 ug/min via INTRAVENOUS

## 2020-03-10 MED ORDER — ROCURONIUM BROMIDE 100 MG/10ML IV SOLN
INTRAVENOUS | Status: DC | PRN
  Administered 2020-03-10 (×2): 50 mg via INTRAVENOUS

## 2020-03-10 MED ORDER — PREDNISOLONE ACETATE 1 % OP SUSP
OPHTHALMIC | Status: AC
  Filled 2020-03-10: qty 5

## 2020-03-10 MED ORDER — PROPARACAINE HCL 0.5 % OP SOLN
1.0000 [drp] | OPHTHALMIC | Status: AC | PRN
  Administered 2020-03-10 (×3): 1 [drp] via OPHTHALMIC
  Filled 2020-03-10: qty 15

## 2020-03-10 MED ORDER — TROPICAMIDE 1 % OP SOLN
1.0000 [drp] | OPHTHALMIC | Status: AC | PRN
  Administered 2020-03-10 (×3): 1 [drp] via OPHTHALMIC
  Filled 2020-03-10: qty 15

## 2020-03-10 MED ORDER — PHENYLEPHRINE HCL 10 % OP SOLN
1.0000 [drp] | OPHTHALMIC | Status: AC | PRN
  Administered 2020-03-10 (×3): 1 [drp] via OPHTHALMIC
  Filled 2020-03-10: qty 5

## 2020-03-10 MED ORDER — SODIUM CHLORIDE (PF) 0.9 % IJ SOLN
INTRAMUSCULAR | Status: AC
  Filled 2020-03-10: qty 10

## 2020-03-10 SURGICAL SUPPLY — 71 items
APL SWBSTK 6 STRL LF DISP (MISCELLANEOUS) ×4
APPLICATOR COTTON TIP 6 STRL (MISCELLANEOUS) ×4 IMPLANT
APPLICATOR COTTON TIP 6IN STRL (MISCELLANEOUS) ×8
BALL CTTN LRG ABS STRL LF (GAUZE/BANDAGES/DRESSINGS)
BAND WRIST GAS GREEN (MISCELLANEOUS) IMPLANT
BLADE EYE CATARACT 19 1.4 BEAV (BLADE) IMPLANT
BNDG EYE OVAL (GAUZE/BANDAGES/DRESSINGS) ×2 IMPLANT
CABLE BIPOLOR RESECTION CORD (MISCELLANEOUS) ×2 IMPLANT
CANNULA ANT CHAM MAIN (OPHTHALMIC RELATED) IMPLANT
CANNULA FLEX TIP 25G (CANNULA) ×2 IMPLANT
CANNULA TROCAR 23 GA VLV (OPHTHALMIC) IMPLANT
CANNULA TROCAR 23G VLV (OPHTHALMIC) IMPLANT
CANNULA VLV SOFT TIP 25G (OPHTHALMIC) IMPLANT
CANNULA VLV SOFT TIP 25GA (OPHTHALMIC) IMPLANT
CLSR STERI-STRIP ANTIMIC 1/2X4 (GAUZE/BANDAGES/DRESSINGS) ×2 IMPLANT
COTTONBALL LRG STERILE PKG (GAUZE/BANDAGES/DRESSINGS) ×3 IMPLANT
COVER WAND RF STERILE (DRAPES) ×1 IMPLANT
DRAPE MICROSCOPE LEICA 46X105 (MISCELLANEOUS) ×2 IMPLANT
DRAPE OPHTHALMIC 77X100 STRL (CUSTOM PROCEDURE TRAY) ×2 IMPLANT
ERASER HMR WETFIELD 23G BP (MISCELLANEOUS) IMPLANT
FILTER BLUE MILLIPORE (MISCELLANEOUS) IMPLANT
FILTER STRAW FLUID ASPIR (MISCELLANEOUS) ×1 IMPLANT
FORCEPS GRIESHABER ILM 25G A (INSTRUMENTS) IMPLANT
GAS AUTO FILL CONSTEL (OPHTHALMIC) ×2
GAS AUTO FILL CONSTELLATION (OPHTHALMIC) IMPLANT
GAS WRIST BAND GREEN (MISCELLANEOUS) ×2
GLOVE BIO SURGEON STRL SZ7.5 (GLOVE) ×4 IMPLANT
GLOVE BIOGEL M 7.0 STRL (GLOVE) ×2 IMPLANT
GOWN STRL REUS W/ TWL LRG LVL3 (GOWN DISPOSABLE) ×2 IMPLANT
GOWN STRL REUS W/ TWL XL LVL3 (GOWN DISPOSABLE) ×1 IMPLANT
GOWN STRL REUS W/TWL LRG LVL3 (GOWN DISPOSABLE) ×6
GOWN STRL REUS W/TWL XL LVL3 (GOWN DISPOSABLE) ×2
KIT BASIN OR (CUSTOM PROCEDURE TRAY) ×2 IMPLANT
KIT PERFLUORON PROCEDURE 5ML (MISCELLANEOUS) IMPLANT
LENS VITRECTOMY FLAT OCLR DISP (MISCELLANEOUS) ×1 IMPLANT
LOOP FINESSE 25 GA (MISCELLANEOUS) IMPLANT
MICROPICK 25G (MISCELLANEOUS)
NDL 18GX1X1/2 (RX/OR ONLY) (NEEDLE) ×1 IMPLANT
NDL 25GX 5/8IN NON SAFETY (NEEDLE) ×3 IMPLANT
NDL HYPO 30X.5 LL (NEEDLE) ×2 IMPLANT
NDL PRECISIONGLIDE 27X1.5 (NEEDLE) IMPLANT
NEEDLE 18GX1X1/2 (RX/OR ONLY) (NEEDLE) ×2 IMPLANT
NEEDLE 25GX 5/8IN NON SAFETY (NEEDLE) ×6 IMPLANT
NEEDLE HYPO 30X.5 LL (NEEDLE) ×4 IMPLANT
NEEDLE PRECISIONGLIDE 27X1.5 (NEEDLE) IMPLANT
NS IRRIG 1000ML POUR BTL (IV SOLUTION) ×2 IMPLANT
PACK VITRECTOMY CUSTOM (CUSTOM PROCEDURE TRAY) ×2 IMPLANT
PAD ARMBOARD 7.5X6 YLW CONV (MISCELLANEOUS) ×3 IMPLANT
PAK PIK VITRECTOMY CVS 25GA (OPHTHALMIC) ×2 IMPLANT
PENCIL BIPOLAR 25GA STR DISP (OPHTHALMIC RELATED) ×2 IMPLANT
PICK MICROPICK 25G (MISCELLANEOUS) IMPLANT
PROBE DIATHERMY DSP 27GA (MISCELLANEOUS) IMPLANT
PROBE ENDO DIATHERMY 25G (MISCELLANEOUS) ×1 IMPLANT
PROBE LASER ILLUM FLEX CVD 25G (OPHTHALMIC) ×1 IMPLANT
REPL STRA BRUSH NDL (NEEDLE) IMPLANT
REPL STRA BRUSH NEEDLE (NEEDLE) IMPLANT
RESERVOIR BACK FLUSH (MISCELLANEOUS) IMPLANT
RETRACTOR IRIS FLEX 25G GRIESH (INSTRUMENTS) IMPLANT
SCISSORS TIP ADVANCED DSP 25GA (INSTRUMENTS) ×1 IMPLANT
SET INJECTOR OIL FLUID CONSTEL (OPHTHALMIC) IMPLANT
SPONGE SURGIFOAM ABS GEL 12-7 (HEMOSTASIS) IMPLANT
STOPCOCK 4 WAY LG BORE MALE ST (IV SETS) IMPLANT
SUT VICRYL 7 0 TG140 8 (SUTURE) ×2 IMPLANT
SYR 10ML LL (SYRINGE) ×3 IMPLANT
SYR 20ML LL LF (SYRINGE) ×2 IMPLANT
SYR 5ML LL (SYRINGE) ×2 IMPLANT
SYR BULB EAR ULCER 3OZ GRN STR (SYRINGE) ×2 IMPLANT
SYR TB 1ML LUER SLIP (SYRINGE) ×4 IMPLANT
TOWEL GREEN STERILE FF (TOWEL DISPOSABLE) ×2 IMPLANT
TUBING HIGH PRESS EXTEN 6IN (TUBING) ×2 IMPLANT
WATER STERILE IRR 1000ML POUR (IV SOLUTION) ×2 IMPLANT

## 2020-03-10 NOTE — Anesthesia Procedure Notes (Signed)
Procedure Name: Intubation Date/Time: 03/10/2020 11:40 AM Performed by: Lance Coon, CRNA Pre-anesthesia Checklist: Patient identified, Emergency Drugs available, Suction available and Patient being monitored Patient Re-evaluated:Patient Re-evaluated prior to induction Oxygen Delivery Method: Circle System Utilized Preoxygenation: Pre-oxygenation with 100% oxygen Induction Type: IV induction Ventilation: Mask ventilation without difficulty Laryngoscope Size: Miller and 2 Grade View: Grade I Tube type: Oral Tube size: 7.5 mm Number of attempts: 1 Airway Equipment and Method: Stylet and Oral airway Placement Confirmation: ETT inserted through vocal cords under direct vision,  positive ETCO2 and breath sounds checked- equal and bilateral Secured at: 19 cm Tube secured with: Tape Dental Injury: Teeth and Oropharynx as per pre-operative assessment

## 2020-03-10 NOTE — Transfer of Care (Signed)
Immediate Anesthesia Transfer of Care Note  Patient: Marcus Johnson.  Procedure(s) Performed: PARS PLANA VITRECTOMY WITH 25 GAUGE (Left Eye) PHOTOCOAGULATION WITH LASER (Left Eye) INSERTION OF GAS - SF6 (Left Eye) GAS/FLUID EXCHANGE (Left Eye)  Patient Location: PACU  Anesthesia Type:General  Level of Consciousness: drowsy and patient cooperative  Airway & Oxygen Therapy: Patient Spontanous Breathing  Post-op Assessment: Report given to RN and Post -op Vital signs reviewed and stable  Post vital signs: Reviewed and stable  Last Vitals:  Vitals Value Taken Time  BP 108/57 03/10/20 1351  Temp 36.1 C 03/10/20 1350  Pulse 70 03/10/20 1354  Resp 28 03/10/20 1354  SpO2 100 % 03/10/20 1354  Vitals shown include unvalidated device data.  Last Pain:  Vitals:   03/10/20 1350  TempSrc:   PainSc: (P) 0-No pain         Complications: No complications documented.

## 2020-03-10 NOTE — Brief Op Note (Signed)
03/10/2020  1:41 PM  PATIENT:  Marcus Johnson.  59 y.o. male  PRE-OPERATIVE DIAGNOSIS:  left eye vitreous hemorrhage  POST-OPERATIVE DIAGNOSIS:  left eye vitreous hemorrhage  PROCEDURE:  Procedure(s): PARS PLANA VITRECTOMY WITH 25 GAUGE (Left) PHOTOCOAGULATION WITH LASER (Left) INSERTION OF GAS - SF6 (Left) GAS/FLUID EXCHANGE (Left)  SURGEON:  Surgeon(s) and Role:    [name], MD - Primary  ASSISTANTS: Ernest Mallick, Ophthalmic Assistant    ANESTHESIA:   general  EBL:  5 mL   BLOOD ADMINISTERED:none  DRAINS: none   LOCAL MEDICATIONS USED:  NONE  SPECIMEN:  No Specimen  DISPOSITION OF SPECIMEN:  N/A  COUNTS:  YES  TOURNIQUET:  * No tourniquets in log *  DICTATION: .Note written in EPIC  PLAN OF CARE: Discharge to home after PACU  PATIENT DISPOSITION:  PACU - hemodynamically stable.   Delay start of Pharmacological VTE agent (>24hrs) due to surgical blood loss or risk of bleeding: yes

## 2020-03-10 NOTE — Discharge Instructions (Addendum)
POSTOPERATIVE INSTRUCTIONS  Your doctor has performed vitreoretinal surgery on you at [facility] [place] eye patched and shielded until seen by Dr. Coralyn Pear 745 AM tomorrow in clinic - Do not use drops until return - [place] - Sleep with belly down or on right side, avoid laying flat on back.    - No strenuous bending, stooping or lifting.  - You may not drive until further notice.  - If your doctor used a gas bubble in your eye during the procedure he will advise you on postoperative positioning. If you have a gas bubble you will be wearing a green bracelet that was applied in the operating room. The green bracelet should stay on as long as the gas bubble is in your eye. While the gas bubble is present you should not fly in an airplane. If you require general anesthesia while the gas bubble is present you must notify your anesthesiologist that an intraocular gas bubble is present so he can take the appropriate precautions.  - Tylenol or any other over-the-counter pain reliever can be used according to your doctor. If more pain medicine is required, your doctor will have a prescription for you.  - You may read, go up and down stairs, and watch television.     [name], M.D., Ph.D.

## 2020-03-10 NOTE — Interval H&P Note (Signed)
History and Physical Interval Note:  03/10/2020 10:27 AM  Marcus Johnson.  has presented today for surgery, with the diagnosis of left eye vitreous hemorrhage.  The various methods of treatment have been discussed with the patient and family. After consideration of risks, benefits and other options for treatment, the patient has consented to  Procedure(s): PARS PLANA VITRECTOMY WITH 25 GAUGE (Left) as a surgical intervention.  The patient's history has been reviewed, patient examined, no change in status, stable for surgery.  I have reviewed the patient's chart and labs.  Questions were answered to the patient's satisfaction.     [name]

## 2020-03-10 NOTE — Op Note (Signed)
Date of procedure:  08.05.2021   Surgeon: [name], MD, PhD  Assistant: Laurian Brim, Ophthalmic Assistant   Pre-operative Diagnoses:  Proliferative diabetic retinopathy, left eye Vitreous hemorrhage, left eye   Post-operative diagnosis:  Proliferative diabetic retinopathy, left eye Vitreous hemorrhage, left eye Preretinal fibrosis and vitreous traction, left eye   Anesthesia: General   Procedure:   1. 25 gauge pars plana vitrectomy, Left Eye 2. Diabetic vitreous membrane peel, Left Eye  3. Endolaser panretinal photocoagulation, Left Eye 4. Fluid-air exchange, Left Eye 5. Injection 18% SF6, Left Eye    Indications for procedure: The patient presented with a long history of decreased vision due to a nonclearing, diabetic vitreous hemorrhage, left eye. After discussing the risks, benefits, and alternatives to surgery, the patient elected to undergo surgical repair and informed consent was obtained.  The surgery was an attempt to clear the vitreous hemorrhage and treat and/or repair any issues or defects contributing to the vitreous hemorrhage, within the reasonable expectations of the surgeon.    Procedure in Detail:    The patient was met in the pre-operative holding area where their identification data was verified.  It was noted that there was a signed, informed consent in the chart and the Left Eye eye was verbally verified by the patient as the operative eye and was marked with a marking pen. The patient was then taken to the operating room and placed in the supine position. General endotracheal anesthesia was induced.  The eye was then prepped with 5% betadine and draped in the normal fashion for ophthalmic surgery. The microscope was draped and swung into position, and a secondary time-out was performed to identify the correct patient, eyes, procedures, and any allergies.   A 25 gauge trocar was inserted in a 30-45 degrees fashion into the inferotemporal quadrant 4 mm  posterior to the limbus in this phakic patient.  Correct positioning within the vitreous was verified externally with the light pipe.  The infusion was then connected to the cannula and BSS infusion was commenced.  Additional ports were placed in the superonasal and superotemporal quadrants. Viscoat was placed on the cornea and the BIOM was used to visualize the posterior segment while the core vitrectomy was completed. The patient had dense ochre-colored vitreous hemorrhage throughout the vitreous cavity. Once the bulk of the vitreous hemorrhage was cleared, there was visible vitreous traction in the posterior pole emanating from the disc. Of note, there was a large fibrotic plug overlying the disc, that was peeled off with a 25g ILM forcep and trimmed with the vitrectomy probe. A posterior vitreous detachment was confirmed using suction over the optic nerve head and lifting anteriorly. The remaining vitreous was very sticky and was meticulously removed. Kenalog was used to aid in this process.      Scleral depression was performed and used to meticulously shave the thick and adherent vitreous base and associated vitreous hemorrhage. Around the optic disc, there were patches of neovascularization with vitreous traction. These neovascular tufts were trimmed down and cauterized to gain hemostasis and prevent further hemorrhaging. Next, endolaser PRP was placed 360 posteriorly just outside the arcades and anteriorly to the ora serrata with the aid of scleral depression. At this time, the peripheral retina was meticulously inspected for breaks and tears -- there were none. Then, a complete air fluid exchange was performed. Following these maneuvers, the retina was attached with good PRP laser in place 360.   The superotemporal port was then removed and sutured with 7-0  vicryl, there was no leakage. 18% SF6 gas was connected to the infusion line and gas was injected into the posterior segment while venting air  through the superonasal trocar using the extrusion cannula. Once a full, 40cc of gas was vented through the eye, the infusion port and venting ports were removed and they were sutured with 7-0 vicryl.  There was no leakage from the sclerotomy sites.  Subconjunctival injections of kefzol + bacitracin + polymixin b and kenalog were then administered, and antibiotic and steroid drops as well as antibiotic ointment were placed in the eye.  The drapes were removed and the eye was patched and shielded.  A green gas bracelet was placed on the patient's left wrist. The patient was then taken to the post-operative area for recovery having tolerated the procedure well.  He was instructed to perform face down positioning postoperatively and to follow up in clinic the following morning as scheduled.   Estimate blood lost: none Complications: None

## 2020-03-10 NOTE — Anesthesia Postprocedure Evaluation (Signed)
Anesthesia Post Note  Patient: Marcus Johnson.  Procedure(s) Performed: PARS PLANA VITRECTOMY WITH 25 GAUGE (Left Eye) PHOTOCOAGULATION WITH LASER (Left Eye) INSERTION OF GAS - SF6 (Left Eye) GAS/FLUID EXCHANGE (Left Eye)     Patient location during evaluation: PACU Anesthesia Type: General Level of consciousness: awake and alert Pain management: pain level controlled Vital Signs Assessment: post-procedure vital signs reviewed and stable Respiratory status: spontaneous breathing, nonlabored ventilation, respiratory function stable and patient connected to nasal cannula oxygen Cardiovascular status: blood pressure returned to baseline and stable Postop Assessment: no apparent nausea or vomiting Anesthetic complications: no   No complications documented.  Last Vitals:  Vitals:   03/10/20 1405 03/10/20 1420  BP: 103/61 107/64  Pulse: 67 64  Resp: 13 13  Temp:  36.7 C  SpO2: 95% 97%    Last Pain:  Vitals:   03/10/20 1420  TempSrc:   PainSc: 0-No pain                 Tiajuana Amass

## 2020-03-11 ENCOUNTER — Other Ambulatory Visit: Payer: Self-pay

## 2020-03-11 ENCOUNTER — Ambulatory Visit (INDEPENDENT_AMBULATORY_CARE_PROVIDER_SITE_OTHER): Payer: 59 | Admitting: Ophthalmology

## 2020-03-11 ENCOUNTER — Encounter (HOSPITAL_COMMUNITY): Payer: Self-pay | Admitting: Ophthalmology

## 2020-03-11 DIAGNOSIS — H4312 Vitreous hemorrhage, left eye: Secondary | ICD-10-CM

## 2020-03-11 DIAGNOSIS — H3581 Retinal edema: Secondary | ICD-10-CM

## 2020-03-11 DIAGNOSIS — E113411 Type 2 diabetes mellitus with severe nonproliferative diabetic retinopathy with macular edema, right eye: Secondary | ICD-10-CM

## 2020-03-11 DIAGNOSIS — E113311 Type 2 diabetes mellitus with moderate nonproliferative diabetic retinopathy with macular edema, right eye: Secondary | ICD-10-CM

## 2020-03-11 DIAGNOSIS — I10 Essential (primary) hypertension: Secondary | ICD-10-CM

## 2020-03-11 DIAGNOSIS — E113512 Type 2 diabetes mellitus with proliferative diabetic retinopathy with macular edema, left eye: Secondary | ICD-10-CM

## 2020-03-11 DIAGNOSIS — H35033 Hypertensive retinopathy, bilateral: Secondary | ICD-10-CM

## 2020-03-11 DIAGNOSIS — H25813 Combined forms of age-related cataract, bilateral: Secondary | ICD-10-CM
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
## 2020-03-18 ENCOUNTER — Other Ambulatory Visit: Payer: Self-pay

## 2020-03-18 ENCOUNTER — Encounter (INDEPENDENT_AMBULATORY_CARE_PROVIDER_SITE_OTHER): Payer: Self-pay | Admitting: Ophthalmology

## 2020-03-18 ENCOUNTER — Ambulatory Visit (INDEPENDENT_AMBULATORY_CARE_PROVIDER_SITE_OTHER): Payer: 59 | Admitting: Ophthalmology

## 2020-03-18 DIAGNOSIS — E113512 Type 2 diabetes mellitus with proliferative diabetic retinopathy with macular edema, left eye: Secondary | ICD-10-CM

## 2020-03-18 DIAGNOSIS — E113411 Type 2 diabetes mellitus with severe nonproliferative diabetic retinopathy with macular edema, right eye: Secondary | ICD-10-CM

## 2020-03-18 DIAGNOSIS — H3581 Retinal edema: Secondary | ICD-10-CM

## 2020-03-18 DIAGNOSIS — H4312 Vitreous hemorrhage, left eye: Secondary | ICD-10-CM

## 2020-03-18 DIAGNOSIS — I10 Essential (primary) hypertension: Secondary | ICD-10-CM

## 2020-03-18 DIAGNOSIS — H35033 Hypertensive retinopathy, bilateral: Secondary | ICD-10-CM

## 2020-03-18 DIAGNOSIS — H25813 Combined forms of age-related cataract, bilateral: Secondary | ICD-10-CM

## 2020-03-18 MED ORDER — PREDNISOLONE ACETATE 1 % OP SUSP
1.0000 [drp] | OPHTHALMIC | 0 refills | Status: DC
Start: 2020-03-18 — End: 2020-04-04
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
## 2020-04-01 ENCOUNTER — Ambulatory Visit (INDEPENDENT_AMBULATORY_CARE_PROVIDER_SITE_OTHER): Payer: 59 | Admitting: *Deleted

## 2020-04-01 DIAGNOSIS — I255 Ischemic cardiomyopathy: Secondary | ICD-10-CM

## 2020-04-01 LAB — CUP PACEART REMOTE DEVICE CHECK
Battery Voltage: 2.64 V
Brady Statistic RV Percent Paced: 0 %
Date Time Interrogation Session: 20210827044228
HighPow Impedance: 304 Ohm
HighPow Impedance: 35 Ohm
HighPow Impedance: 42 Ohm
Implantable Lead Implant Date: 20130603
Implantable Lead Location: 753860
Implantable Lead Model: 6947
Implantable Pulse Generator Implant Date: 20130603
Lead Channel Impedance Value: 342 Ohm
Lead Channel Pacing Threshold Amplitude: 0.875 V
Lead Channel Pacing Threshold Pulse Width: 0.4 ms
Lead Channel Sensing Intrinsic Amplitude: 8.125 mV
Lead Channel Sensing Intrinsic Amplitude: 8.125 mV
Lead Channel Setting Pacing Amplitude: 2.5 V
Lead Channel Setting Pacing Pulse Width: 0.4 ms
Lead Channel Setting Sensing Sensitivity: 0.3 mV

## 2020-04-04 ENCOUNTER — Encounter (INDEPENDENT_AMBULATORY_CARE_PROVIDER_SITE_OTHER): Payer: Self-pay | Admitting: Ophthalmology

## 2020-04-04 ENCOUNTER — Other Ambulatory Visit: Payer: Self-pay

## 2020-04-04 ENCOUNTER — Ambulatory Visit (INDEPENDENT_AMBULATORY_CARE_PROVIDER_SITE_OTHER): Payer: 59 | Admitting: Ophthalmology

## 2020-04-04 DIAGNOSIS — H3581 Retinal edema: Secondary | ICD-10-CM | POA: Diagnosis not present

## 2020-04-04 DIAGNOSIS — E113311 Type 2 diabetes mellitus with moderate nonproliferative diabetic retinopathy with macular edema, right eye: Secondary | ICD-10-CM

## 2020-04-04 DIAGNOSIS — H35033 Hypertensive retinopathy, bilateral: Secondary | ICD-10-CM

## 2020-04-04 DIAGNOSIS — I10 Essential (primary) hypertension: Secondary | ICD-10-CM

## 2020-04-04 DIAGNOSIS — H25813 Combined forms of age-related cataract, bilateral: Secondary | ICD-10-CM

## 2020-04-04 DIAGNOSIS — E113512 Type 2 diabetes mellitus with proliferative diabetic retinopathy with macular edema, left eye: Secondary | ICD-10-CM

## 2020-04-04 DIAGNOSIS — E113411 Type 2 diabetes mellitus with severe nonproliferative diabetic retinopathy with macular edema, right eye: Secondary | ICD-10-CM

## 2020-04-04 DIAGNOSIS — H4312 Vitreous hemorrhage, left eye: Secondary | ICD-10-CM

## 2020-04-04 MED ORDER — PREDNISOLONE ACETATE 1 % OP SUSP: 1.0000 [drp] | Freq: Every day | OPHTHALMIC | 0 refills | Status: DC

## 2020-04-05 NOTE — Progress Notes (Signed)
Remote ICD transmission.   

## 2020-04-06 ENCOUNTER — Other Ambulatory Visit: Payer: Self-pay

## 2020-04-06 ENCOUNTER — Ambulatory Visit (HOSPITAL_COMMUNITY)
Admission: RE | Admit: 2020-04-06 | Discharge: 2020-04-06 | Disposition: A | Discharge status: Home / Self Care | Payer: 59 | Source: Ambulatory Visit | Attending: Cardiology | Admitting: Cardiology

## 2020-04-06 ENCOUNTER — Encounter (HOSPITAL_COMMUNITY): Payer: Self-pay | Admitting: Cardiology

## 2020-04-06 VITALS — BP 142/84 | HR 78 | Ht 70.5 in | Wt 205.0 lb

## 2020-04-06 DIAGNOSIS — I4892 Unspecified atrial flutter: Secondary | ICD-10-CM | POA: Diagnosis not present

## 2020-04-06 DIAGNOSIS — I255 Ischemic cardiomyopathy: Secondary | ICD-10-CM | POA: Insufficient documentation

## 2020-04-06 DIAGNOSIS — I5022 Chronic systolic (congestive) heart failure: Secondary | ICD-10-CM | POA: Diagnosis not present

## 2020-04-06 DIAGNOSIS — Z8249 Family history of ischemic heart disease and other diseases of the circulatory system: Secondary | ICD-10-CM | POA: Diagnosis not present

## 2020-04-06 DIAGNOSIS — I2510 Atherosclerotic heart disease of native coronary artery without angina pectoris: Secondary | ICD-10-CM | POA: Insufficient documentation

## 2020-04-06 DIAGNOSIS — E11319 Type 2 diabetes mellitus with unspecified diabetic retinopathy without macular edema: Secondary | ICD-10-CM | POA: Diagnosis not present

## 2020-04-06 DIAGNOSIS — M109 Gout, unspecified: Secondary | ICD-10-CM | POA: Insufficient documentation

## 2020-04-06 DIAGNOSIS — E1142 Type 2 diabetes mellitus with diabetic polyneuropathy: Secondary | ICD-10-CM | POA: Diagnosis not present

## 2020-04-06 DIAGNOSIS — I130 Hypertensive heart and chronic kidney disease with heart failure and stage 1 through stage 4 chronic kidney disease, or unspecified chronic kidney disease: Secondary | ICD-10-CM | POA: Insufficient documentation

## 2020-04-06 DIAGNOSIS — E1122 Type 2 diabetes mellitus with diabetic chronic kidney disease: Secondary | ICD-10-CM | POA: Insufficient documentation

## 2020-04-06 DIAGNOSIS — I252 Old myocardial infarction: Secondary | ICD-10-CM | POA: Insufficient documentation

## 2020-04-06 DIAGNOSIS — Z833 Family history of diabetes mellitus: Secondary | ICD-10-CM | POA: Diagnosis not present

## 2020-04-06 DIAGNOSIS — E1121 Type 2 diabetes mellitus with diabetic nephropathy: Secondary | ICD-10-CM | POA: Diagnosis not present

## 2020-04-06 DIAGNOSIS — N183 Chronic kidney disease, stage 3 unspecified: Secondary | ICD-10-CM | POA: Insufficient documentation

## 2020-04-06 DIAGNOSIS — Z951 Presence of aortocoronary bypass graft: Secondary | ICD-10-CM | POA: Insufficient documentation

## 2020-04-06 DIAGNOSIS — Z794 Long term (current) use of insulin: Secondary | ICD-10-CM | POA: Insufficient documentation

## 2020-04-06 DIAGNOSIS — I480 Paroxysmal atrial fibrillation: Secondary | ICD-10-CM

## 2020-04-06 DIAGNOSIS — I5042 Chronic combined systolic (congestive) and diastolic (congestive) heart failure: Secondary | ICD-10-CM

## 2020-04-06 DIAGNOSIS — E1136 Type 2 diabetes mellitus with diabetic cataract: Secondary | ICD-10-CM | POA: Insufficient documentation

## 2020-04-06 DIAGNOSIS — Z7901 Long term (current) use of anticoagulants: Secondary | ICD-10-CM | POA: Insufficient documentation

## 2020-04-06 DIAGNOSIS — Z79899 Other long term (current) drug therapy: Secondary | ICD-10-CM | POA: Insufficient documentation

## 2020-04-06 LAB — BASIC METABOLIC PANEL
Anion gap: 14 (ref 5–15)
BUN: 42 mg/dL — ABNORMAL HIGH (ref 6–20)
CO2: 26 mmol/L (ref 22–32)
Calcium: 10 mg/dL (ref 8.9–10.3)
Chloride: 100 mmol/L (ref 98–111)
Creatinine, Ser: 1.8 mg/dL — ABNORMAL HIGH (ref 0.61–1.24)
GFR calc Af Amer: 47 mL/min — ABNORMAL LOW (ref >60)
GFR calc non Af Amer: 40 mL/min — ABNORMAL LOW (ref >60)
Glucose, Bld: 93 mg/dL (ref 70–99)
Potassium: 4.3 mmol/L (ref 3.5–5.1)
Sodium: 140 mmol/L (ref 135–145)

## 2020-04-06 LAB — CBC
HCT: 47.1 % (ref 39.0–52.0)
Hemoglobin: 14.1 g/dL (ref 13.0–17.0)
MCH: 25.4 pg — ABNORMAL LOW (ref 26.0–34.0)
MCHC: 29.9 g/dL — ABNORMAL LOW (ref 30.0–36.0)
MCV: 84.7 fL (ref 80.0–100.0)
Platelets: 242 10*3/uL (ref 150–400)
RBC: 5.56 MIL/uL (ref 4.22–5.81)
RDW: 16 % — ABNORMAL HIGH (ref 11.5–15.5)
WBC: 7.1 10*3/uL (ref 4.0–10.5)
nRBC: 0 % (ref 0.0–0.2)

## 2020-04-06 LAB — HEMOGLOBIN A1C
Hgb A1c MFr Bld: 9.5 % — ABNORMAL HIGH (ref 4.8–5.6)
Mean Plasma Glucose: 225.95 mg/dL

## 2020-04-06 LAB — DIGOXIN LEVEL: Digoxin Level: <0.2 ng/mL — ABNORMAL LOW (ref 0.8–2.0)

## 2020-04-06 NOTE — Patient Instructions (Signed)
Labs done today, your results will be available in MyChart, we will contact you for abnormal readings.  Your physician recommends that you schedule a follow-up appointment in: 2 months  If you have any questions or concerns before your next appointment please send [place] a message through [address] or call our office at [phone].    TO LEAVE A MESSAGE FOR THE NURSE SELECT OPTION 2, PLEASE LEAVE A MESSAGE INCLUDING: . YOUR NAME . DATE OF BIRTH . CALL BACK NUMBER . REASON FOR CALL**this is important as we prioritize the call backs  [place] AS LONG AS YOU CALL BEFORE 4:00 PM  At the [facility], you and your health needs are our priority. As part of our continuing mission to provide you with exceptional heart care, we have created designated Provider Care Teams. These Care Teams include your primary Cardiologist (physician) and Advanced Practice Providers (APPs- Physician Assistants and Nurse Practitioners) who all work together to provide you with the care you need, when you need it.   You may see any of the following providers on your designated Care Team at your next follow up: Marland Kitchen Dr Glori Bickers . Dr Loralie Champagne . Darrick Grinder, NP . Lyda Jester, PA . Audry Riles, PharmD   Please be sure to bring in all your medications bottles to every appointment.

## 2020-04-06 NOTE — Progress Notes (Signed)
Patient ID: Marcus Letarte., male   DOB: 1961-06-01, 59 y.o.   MRN: [id number] PCP: Dr. Sarajane Jews Cardiology: Dr. Aundra Dubin  HPI: Marcus Johnson is a 59 y.o. male with h/o DM2, CAD s/p CABG 2683, systolic HF due to ischemic cardiomyopathy with EF 20-25% (echo 12/15), DM2 and CKD. He is s/p Medtronic single chamber ICD.  Admitted in 12/15 due to ADHF. Required short course milrinone for diuresis. Diuresed 30 pounds.   CPX 2/16 showed severely reduced functional capacity.  There was a significant disconnect between his symptoms and the CPX.  RHC in 6/16 showed fairly normal filling pressures and low but not markedly low cardiac output.   CPX (4/19) was submaximal but suggested severe limitation from HF.   He was admitted in 6/19 with marked volume overload and dyspnea.  Echo in 6/19 showed EF 20% with severe global hypokinesis.  LHC/RHC showed severe native vessel CAD with patent LIMA-D, SVG-OM, and SVG-PLV; cardiac index low.  He was started on milrinone and diuresed.  We discussed LVAD extensively in light of low output, creatinine that is trending up and concern for decompensation of RV over time. He was adamant that he did not want to pursue LVAD workup yet and did not want a referral for transplant evaluation. Milrinone was weaned off and he was discharged home.   Admitted 08/01/18-08/07/18 with volume overload and left foot wound. Diuresed 38 lbs with IV lasix and then transitioned back to torsemide 100 mg BID. ID was consulted with concerns for left foot osteomyelitis noted on CT scan. He got IV ancef q8 hours x 42 days. [facility] provided teaching and supplies for home antibiotics. Course complicated by AKI. DC weight 200 lbs.   He went into atrial flutter and had DCCV to NSR in 1/20.   Echo in 4/21 showed EF 25% with apical akinesis and diffuse hypokinesis relatively preserved in lateral wall, mildly decreased RV systolic function, PASP 41 mmHg. CPX in 5/21 showed severe functional limitation suggestive  of advanced HF. 5/21 Zio patch showed short atrial flutter runs, 3.8% PVCs.   He returns for followup of CHF.  Weight is stable.  He is symptomatically stable.  Mild dyspnea walking up to his 3rd floor apartment while carrying groceries.  No dyspnea walking on flat ground.  No orthopnea/PND.  No chest pain.  BP is mildly elevated today but he has yet to take his am meds.  Still gets occasional episodes of lightheadedness if he stands too fast, no syncope.     Medtronic ICD interrogation: No VT.  Stable thoracic impedance.   Studies: Echo (12/15): EF 20-25% RV moderately HK CPX (2/16): RER 1.10, peak VO2 8.6, VE/VCO2 slope 43.7 => severely reduced functional capacity.  RHC (6/16): mean RA 8, PA 48/19 mean 30, mean PCWP 14, CI 2.03 Fick/2.28 Thermo, PVR 3.6.  Echo (5/17): EF 25%, mild LV dilation, moderate diastolic dysfunction, normal RV size with mild to moderate systolic dysfunction, mild mitral regurgitation CPX (4/19): Peak VO2 7, VE/VCO2 slope 41, RER 1.02 => submaximal but still suggestive of severe HF limitation.  Echo (6/19): EF 20%, diffuse severe HK, moderately decreased RV systolic function, moderate TR.  LHC/RHC (6/19): Severe native vessel CAD with patent LIMA-D, SVG-OM, and SVG-PLV; mean RA 15, PA 54/24 mean 37, mean PCWP 32, CI 1.86 Fick/1.64 thermo. PAPi 2, PVR 1.25 WU.  DC-CV 09/03/18 -->NSR  Echo (4/21): EF 25% with apical akinesis and diffuse hypokinesis relatively preserved in lateral wall, mildly decreased RV systolic function, PASP  41 mmHg. CPX (5/21): RER 1.1, VE/VCO2 slope 34, peak VO2 10.1 => severe functional limitation suggestive of severe HF limitation.  Zio (5/21): Zio patch showed short atrial flutter runs, 3.8% PVCs.   Labs:  8/15 LDL 88 12/15 K+ 4.2 creatinine 1.09  1/16 K 4.3, creatinine 1.34 4/16 K 4.5, creatinine 1.19 3/17 K 3.8, creatinine 1.22, HCT 35.3, BNP 1597 5/17 K 4.1, creatinine 1.25 6/18 LDL 138, K 4.4, creatinine 1.5 6/19 hgb 13.5, K 4.9,  creatinine 1.53 => 1.5 => 1.4, BNP 992 08/07/2018: K 4.3 Creatinine 1.86  6/20: LDL 98, TSH normal, Hgb 13.5, K 4.3, creatinine 2.05 7/20: K 4.2, creatinine 2.56 => 2.03 8/20: LDL 34, HDL 22 3/21: K 4.1, creatinine 1.92 4/21: K 4, creatinine 1.96 6/21: K 5.2, creatinine 1.76  Review of systems complete and found to be negative unless listed in HPI.   SH:  Social History   Socioeconomic History  . Marital status: Divorced    Spouse name: Not on file  . Number of children: Not on file  . Years of education: Not on file  . Highest education level: Not on file  Occupational History  . Not on file  Tobacco Use  . Smoking status: Never Smoker  . Smokeless tobacco: Never Used  Vaping Use  . Vaping Use: Never used  Substance and Sexual Activity  . Alcohol use: No    Alcohol/week: 0.0 standard drinks  . Drug use: No  . Sexual activity: Not on file  Other Topics Concern  . Not on file  Social History Narrative   Lives in [place] with his son and his family.  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   Social Determinants of Health   Financial Resource Strain:   . Difficulty of Paying Living Expenses: Not on file  Food Insecurity:   . Worried About Charity fundraiser in the Last Year: Not on file  . Ran Out of Food in the Last Year: Not on file  Transportation Needs:   . Lack of Transportation (Medical): Not on file  . Lack of Transportation (Non-Medical): Not on file  Physical Activity:   . Days of Exercise per Week: Not on file  . Minutes of Exercise per Session: Not on file  Stress:   . Feeling of Stress : Not on file  Social Connections:   . Frequency of Communication with Friends and Family: Not on file  . Frequency of Social Gatherings with Friends and Family: Not on file  . Attends Religious Services: Not on file  . Active Member of Clubs or Organizations: Not on file  . Attends Archivist Meetings: Not on file  . Marital  Status: Not on file  Intimate Partner Violence:   . Fear of Current or Ex-Partner: Not on file  . Emotionally Abused: Not on file  . Physically Abused: Not on file  . Sexually Abused: Not on file    FH:  Family History  Problem Relation Age of Onset  . Diabetes Father        died in his 56's  . Hypertension Mother        died in her 66's - had a ppm.  . Amblyopia Neg Hx   . Blindness Neg Hx   . Cataracts Neg Hx   . Glaucoma Neg Hx   . Macular degeneration Neg Hx   . Retinal detachment Neg Hx   . Strabismus Neg Hx   .  Retinitis pigmentosa Neg Hx     Past Medical History:  Diagnosis Date  . AICD (automatic cardioverter/defibrillator) present    Single-chamber  implantable cardiac defibrillator - Medtronic  . Cataract    Mixed form OU  . CHF (congestive heart failure) ([place])   . Chronic systolic heart failure ([place])    a. Echo 5/13: Mild LVE, mild LVH, EF 10%, anteroseptal, lateral, apical AK, mild MR, mild LAE, moderately reduced RVSF, mild RAE, PASP 60;  b. 07/2014 Echo: EF 20-2%, diff HK, AKI of antsep/apical/mid-apicalinferior, mod reduced RV.  Marland Kitchen Coronary artery disease    a. s/p CABG 2002 b. LHC 5/13:  dLM 80%, LAD subtotally occluded, pCFX occluded, pRCA 50%, mid? Occlusion with high take off of the PDA with 70% multiple lesions-not bypassed and supplies collaterals to LAD, LIMA-IM/ramus ok, S-OM ok, S-PLA branches ok. Medical therapy was recommended  . Diabetic retinopathy (HCC)    NPDR OD, PDR OS  . Gout    "on daily RX" (01/08/2018)  . Hypertension   . Hypertensive retinopathy    OU  . Ischemic cardiomyopathy    a. Echo 5/13: Mild LVE, mild LVH, EF 10%, anteroseptal, lateral, apical AK, mild MR, mild LAE, moderately reduced RVSF, mild RAE, PASP 60;  b. 01/2012 s/p MDT [id number] Protecta XT VR AICD;  c. 07/2014 Echo: EF 20-2%, diff HK, AKI of antsep/apical/mid-apicalinferior, mod reduced RV.  Marland Kitchen MRSA (methicillin resistant Staphylococcus aureus)    Status post right foot  plantar deep infection with MRSA status post  I&D 10/2008  . Myocardial infarction [facility])    "was told I'd had several before heart OR in 2002" (01/08/2018)  . Noncompliance   . Peripheral neuropathy   . Syncope   . Type II diabetes mellitus ([place])    requiring insulin   . Vitreous hemorrhage, left (HCC)    and proliferative diabetic retinopathy    Current Outpatient Medications  Medication Sig Dispense Refill  . allopurinol (ZYLOPRIM) 300 MG tablet Take 600 mg by mouth daily as needed (gout).    Marland Kitchen apixaban (ELIQUIS) 5 MG TABS tablet Take 1 tablet (5 mg total) by mouth 2 (two) times daily. 60 tablet 6  . blood glucose meter kit and supplies KIT Use as directed. 1 each 0  . carvedilol (COREG) 12.5 MG tablet Take 1 tablet (12.5 mg total) by mouth 2 (two) times daily. 30 tablet 11  . dapagliflozin propanediol (FARXIGA) 10 MG TABS tablet Take 10 mg by mouth daily before breakfast. 30 tablet 6  . digoxin (LANOXIN) 0.125 MG tablet TAKE 1 TABLET BY MOUTH EVERY DAY 90 tablet 3  . ENTRESTO 49-51 MG TAKE 1 TABLET BY MOUTH TWICE A DAY (Patient taking differently: Take 1 tablet by mouth 2 (two) times daily. ) 60 tablet 6  . glucose blood test strip Use to test blood sugar twice a day 100 each 3  . insulin aspart (NOVOLOG FLEXPEN) 100 UNIT/ML FlexPen INJECT 5 UNITS INTO THE SKIN 3 TIMES DAILY WITH MEALS (Patient taking differently: Inject 5-10 Units into the skin 3 (three) times daily with meals. ) 15 mL 11  . Insulin Pen Needle (BD PEN NEEDLE NANO U/F) 32G X 4 MM MISC USE 4 TIMES A DAY 100 each 11  . ketoconazole (NIZORAL) 2 % cream Apply 1 application topically 2 (two) times daily as needed for irritation. 30 g 5  . KLOR-CON M20 20 MEQ tablet TAKE 1 TABLET (20 MEQ TOTAL) BY MOUTH DAILY. TAKE 1 TABLET EXTRA ON DAYS  YOU TAKE METOLAZONE. (Patient taking differently: Take 20-40 mEq by mouth See admin instructions. Take 20 meq daily except on Saturday take 40 meq with the metolazone) 90 tablet 3  . Lancets  (ONETOUCH ULTRASOFT) lancets Use to test blood sugar twice a day 100 each 3  . LANTUS SOLOSTAR 100 UNIT/ML Solostar Pen INJECT 50 UNITS INTO THE SKIN AT BEDTIME. (Patient taking differently: Inject 50 Units into the skin at bedtime. ) 15 mL 1  . metolazone (ZAROXOLYN) 2.5 MG tablet Take 1 tablet (2.5 mg total) by mouth once a week. On Saturdays 5 tablet 3  . prednisoLONE acetate (PRED FORTE) 1 % ophthalmic suspension Place 1 drop into the left eye 6 (six) times daily. 15 mL 0  . rosuvastatin (CRESTOR) 40 MG tablet TAKE 1 TABLET BY MOUTH EVERY DAY (Patient taking differently: Take 40 mg by mouth daily. ) 90 tablet 3  . spironolactone (ALDACTONE) 25 MG tablet TAKE 1 TABLET BY MOUTH EVERY DAY (Patient taking differently: Take 25 mg by mouth daily. ) 90 tablet 0  . torsemide (DEMADEX) 20 MG tablet TAKE 4 TABLETS (80 MG TOTAL) BY MOUTH EVERY MORNING AND 2 TABLETS (40 MG TOTAL) EVERY EVENING. (Patient taking differently: Take 60-80 mg by mouth See admin instructions. Take 80 mg in the morning and 60 mg in the evening) 540 tablet 2   No current facility-administered medications for this encounter.    Vitals:   04/06/20 0953  BP: (!) 142/84  Pulse: 78  SpO2: 99%  Weight: 93 kg (205 lb)  Height: 5' 10.5" (1.791 m)   Wt Readings from Last 3 Encounters:  04/06/20 93 kg (205 lb)  03/10/20 93.4 kg (206 lb)  02/25/20 (!) 93 kg (205 lb)    PHYSICAL EXAM: General: NAD Neck: No JVD, no thyromegaly or thyroid nodule.  Lungs: Clear to auscultation bilaterally with normal respiratory effort. CV: Nondisplaced PMI.  Heart regular S1/S2, no S3/S4, no murmur.  No peripheral edema.  No carotid bruit.  Normal pedal pulses.  Abdomen: Soft, nontender, no hepatosplenomegaly, no distention.  Skin: Intact without lesions or rashes.  Neurologic: Alert and oriented x 3.  Psych: Normal affect. Extremities: No clubbing or cyanosis.  HEENT: Normal.   ASSESSMENT & PLAN: 1.Chronic systolic HF:ICM.HasMedtronic  ICD. Narrow QRS, not CRT candidate. Has required milrinone multiple times in the past for low output HF. Offered advanced therapies in past but declined. Echo 6/19 with EF 20% and moderately decreased RV systolic function.CPX 4/19 with severe functional limitation due to [facility] 6/19 showed low output and coronary angiography showed no interventional targets.Echo 08/04/18 with EF 20-25%, mild LVH, diffuse HK, akinesis of anteroseptal and anterior myocardium, grade 2 DD, trivial AI, LA mod dilated, RV moderately dilated, RA mildly dilated, moderate TR, PA peak pressure 49 mm Hg.Echo in 4/21 showed EF 25%, mildly decreased RV systolic function.  CPX in 5/21 showed severe HF limitation.  He is not volume overloaded by exam or Optivol.  NYHA class II symptoms.  Stable recently.  History of cardiorenal syndrome/diabetic nephropathy, last creatinine 1.76.    - Continue torsemide 80 qam/60 qpm with 2.5 mg metolazone once weekly starting tomorrow.  BMET today.  - Increase Entresto to 97/103 bid.  BMET today and in 10 days.  - Continuespironolactone25 mg daily.  -Intolerant ivabradine due to nausea. - Continue Correg 12.5 mg BID - Continue digoxin0.125 mg daily. Check digoxin level today.  - Continue dapafliglozin 10 mg daily.    - Has been very resistant to  LVAD/transplant evaluation, both offered with multiple discussions in past.  I worry that we will miss the window for advanced therapies.  He seems to be stable symptomatically today, however.  - He was not interested in baroreflex activation therapy.  - QRS not wide, not CRT candidate.  2. CAD s/p CABG2002:Last cath in 6/19 with patent grafts. No chest pain.  - No ASA given apixaban use.  - Continue Crestor 40 mg daily.  3. CKD Stage 3:Creatinine 1.76 when checked most recently. I suspect that this is a combination of cardiorenal syndrome and diabetic nephropathy.  - BMET today.   - I have recommended nephrology referral but he does not  want this yet due to finances.   4. DM: History of very poor control.  Followup with PCP.  5. Atrial flutter: s/p DC-CV 08/21/18.  He is in NSR today, denies palpitations.  - Continue Eliquis.   Followup 2 months.   Loralie Champagne, MD 04/06/2020

## 2020-04-08 ENCOUNTER — Telehealth (HOSPITAL_COMMUNITY): Payer: Self-pay | Admitting: *Deleted

## 2020-04-08 DIAGNOSIS — I5042 Chronic combined systolic (congestive) and diastolic (congestive) heart failure: Secondary | ICD-10-CM

## 2020-04-08 MED ORDER — ENTRESTO 97-103 MG PO TABS
1.0000 | ORAL_TABLET | Freq: Two times a day (BID) | ORAL | 6 refills | Status: DC
Start: 2020-04-08 — End: 2020-11-14

## 2020-04-08 NOTE — Telephone Encounter (Signed)
Spoke w/pt, he is aware, agreeable and verbalized understanding, rx sent in, labs sch for 9/13

## 2020-04-08 NOTE — Telephone Encounter (Signed)
-----   Message from Larey Dresser, MD sent at 04/06/2020 10:44 PM EDT ----- Creatinine stable on labs today.  I would like him to increase Entresto to 97/103 bid with BMET in 10 days.

## 2020-04-12 ENCOUNTER — Other Ambulatory Visit: Payer: Self-pay

## 2020-04-12 ENCOUNTER — Telehealth: Payer: Self-pay | Admitting: Family Medicine

## 2020-04-12 ENCOUNTER — Other Ambulatory Visit: Payer: Self-pay | Admitting: Family Medicine

## 2020-04-12 DIAGNOSIS — Z794 Long term (current) use of insulin: Secondary | ICD-10-CM

## 2020-04-12 DIAGNOSIS — N183 Chronic kidney disease, stage 3 unspecified: Secondary | ICD-10-CM

## 2020-04-12 DIAGNOSIS — E1122 Type 2 diabetes mellitus with diabetic chronic kidney disease: Secondary | ICD-10-CM

## 2020-04-12 MED ORDER — LANTUS SOLOSTAR 100 UNIT/ML ~~LOC~~ SOPN: PEN_INJECTOR | SUBCUTANEOUS | 1 refills | Status: DC

## 2020-04-12 NOTE — Telephone Encounter (Signed)
error 

## 2020-04-13 ENCOUNTER — Encounter: Payer: Self-pay | Admitting: Family Medicine

## 2020-04-13 ENCOUNTER — Ambulatory Visit: Payer: 59 | Admitting: Family Medicine

## 2020-04-13 VITALS — BP 120/80 | HR 78 | Temp 99.3°F | Wt 205.6 lb

## 2020-04-13 DIAGNOSIS — I5042 Chronic combined systolic (congestive) and diastolic (congestive) heart failure: Secondary | ICD-10-CM

## 2020-04-13 DIAGNOSIS — I255 Ischemic cardiomyopathy: Secondary | ICD-10-CM | POA: Diagnosis not present

## 2020-04-13 DIAGNOSIS — E1122 Type 2 diabetes mellitus with diabetic chronic kidney disease: Secondary | ICD-10-CM | POA: Diagnosis not present

## 2020-04-13 DIAGNOSIS — Z794 Long term (current) use of insulin: Secondary | ICD-10-CM

## 2020-04-13 DIAGNOSIS — N183 Chronic kidney disease, stage 3 unspecified: Secondary | ICD-10-CM

## 2020-04-13 DIAGNOSIS — I10 Essential (primary) hypertension: Secondary | ICD-10-CM | POA: Diagnosis not present

## 2020-04-13 DIAGNOSIS — Z23 Encounter for immunization: Secondary | ICD-10-CM | POA: Diagnosis not present

## 2020-04-13 MED ORDER — LANTUS SOLOSTAR 100 UNIT/ML ~~LOC~~ SOPN: PEN_INJECTOR | SUBCUTANEOUS | 11 refills | Status: DC

## 2020-04-13 NOTE — Progress Notes (Signed)
   Subjective:    Patient ID: Marcus Johnson., male    DOB: 08/11/1960, 59 y.o.   MRN: [id number]  HPI Here to follow up on issues, including diabetes. He is doing well all in all. He is getting treatments for diabetic retinopathy. His BP is stable. His CHF is stable. He has been able to drop his A1c from 14 in May to 9.5 on 04-06-20. His am fast glucoses are ranging from 88 to 136.    Review of Systems  Constitutional: Negative.   Respiratory: Negative.   Cardiovascular: Negative.        Objective:   Physical Exam Constitutional:      Appearance: Normal appearance.  Cardiovascular:     Rate and Rhythm: Normal rate and regular rhythm.     Pulses: Normal pulses.     Heart sounds: Normal heart sounds.  Pulmonary:     Effort: Pulmonary effort is normal.     Breath sounds: Normal breath sounds.  Musculoskeletal:     Right lower leg: No edema.     Left lower leg: No edema.  Neurological:     Mental Status: He is alert.           Assessment & Plan:  He is doing well, although he still needs to make some progress with the diabetes. We will check another A1c in 2 months. Alysia Penna, MD

## 2020-04-18 ENCOUNTER — Other Ambulatory Visit: Payer: Self-pay

## 2020-04-18 ENCOUNTER — Ambulatory Visit (HOSPITAL_COMMUNITY)
Admission: RE | Admit: 2020-04-18 | Discharge: 2020-04-18 | Disposition: A | Discharge status: Home / Self Care | Payer: 59 | Source: Ambulatory Visit | Attending: Internal Medicine | Admitting: Internal Medicine

## 2020-04-18 DIAGNOSIS — I5042 Chronic combined systolic (congestive) and diastolic (congestive) heart failure: Secondary | ICD-10-CM | POA: Diagnosis not present

## 2020-04-18 LAB — BASIC METABOLIC PANEL
Anion gap: 9 (ref 5–15)
BUN: 32 mg/dL — ABNORMAL HIGH (ref 6–20)
CO2: 25 mmol/L (ref 22–32)
Calcium: 8.8 mg/dL — ABNORMAL LOW (ref 8.9–10.3)
Chloride: 107 mmol/L (ref 98–111)
Creatinine, Ser: 1.74 mg/dL — ABNORMAL HIGH (ref 0.61–1.24)
GFR calc Af Amer: 49 mL/min — ABNORMAL LOW (ref >60)
GFR calc non Af Amer: 42 mL/min — ABNORMAL LOW (ref >60)
Glucose, Bld: 175 mg/dL — ABNORMAL HIGH (ref 70–99)
Potassium: 4.1 mmol/L (ref 3.5–5.1)
Sodium: 141 mmol/L (ref 135–145)
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
## 2020-05-02 ENCOUNTER — Other Ambulatory Visit (HOSPITAL_COMMUNITY): Payer: Self-pay | Admitting: Cardiology

## 2020-05-02 ENCOUNTER — Ambulatory Visit (INDEPENDENT_AMBULATORY_CARE_PROVIDER_SITE_OTHER): Payer: 59 | Admitting: Emergency Medicine

## 2020-05-02 DIAGNOSIS — I255 Ischemic cardiomyopathy: Secondary | ICD-10-CM

## 2020-05-03 ENCOUNTER — Ambulatory Visit (INDEPENDENT_AMBULATORY_CARE_PROVIDER_SITE_OTHER): Payer: 59 | Admitting: Ophthalmology

## 2020-05-03 ENCOUNTER — Encounter (INDEPENDENT_AMBULATORY_CARE_PROVIDER_SITE_OTHER): Payer: Self-pay | Admitting: Ophthalmology

## 2020-05-03 ENCOUNTER — Other Ambulatory Visit: Payer: Self-pay

## 2020-05-03 DIAGNOSIS — H4312 Vitreous hemorrhage, left eye: Secondary | ICD-10-CM | POA: Diagnosis not present

## 2020-05-03 DIAGNOSIS — E113411 Type 2 diabetes mellitus with severe nonproliferative diabetic retinopathy with macular edema, right eye: Secondary | ICD-10-CM

## 2020-05-03 DIAGNOSIS — I10 Essential (primary) hypertension: Secondary | ICD-10-CM

## 2020-05-03 DIAGNOSIS — E113512 Type 2 diabetes mellitus with proliferative diabetic retinopathy with macular edema, left eye: Secondary | ICD-10-CM | POA: Diagnosis not present

## 2020-05-03 DIAGNOSIS — H35033 Hypertensive retinopathy, bilateral: Secondary | ICD-10-CM

## 2020-05-03 DIAGNOSIS — H25813 Combined forms of age-related cataract, bilateral: Secondary | ICD-10-CM

## 2020-05-03 DIAGNOSIS — H3581 Retinal edema: Secondary | ICD-10-CM

## 2020-05-03 LAB — CUP PACEART REMOTE DEVICE CHECK
Battery Voltage: 2.63 V
Brady Statistic RV Percent Paced: 0.01 %
Date Time Interrogation Session: 20210927012407
HighPow Impedance: 304 Ohm
HighPow Impedance: 35 Ohm
HighPow Impedance: 42 Ohm
Implantable Lead Implant Date: 20130603
Implantable Lead Location: 753860
Implantable Lead Model: 6947
Implantable Pulse Generator Implant Date: 20130603
Lead Channel Impedance Value: 380 Ohm
Lead Channel Pacing Threshold Amplitude: 0.75 V
Lead Channel Pacing Threshold Pulse Width: 0.4 ms
Lead Channel Sensing Intrinsic Amplitude: 8.5 mV
Lead Channel Sensing Intrinsic Amplitude: 8.5 mV
Lead Channel Setting Pacing Amplitude: 2.5 V
Lead Channel Setting Pacing Pulse Width: 0.4 ms
Lead Channel Setting Sensing Sensitivity: 0.3 mV

## 2020-05-05 NOTE — Addendum Note (Signed)
Addended by: Cheri Kearns A on: 05/05/2020 09:38 AM   Modules accepted: Level of Service

## 2020-05-05 NOTE — Progress Notes (Signed)
Remote ICD transmission.   

## 2020-05-19 ENCOUNTER — Other Ambulatory Visit (HOSPITAL_COMMUNITY): Payer: Self-pay | Admitting: Cardiology
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
## 2020-05-31 ENCOUNTER — Encounter (INDEPENDENT_AMBULATORY_CARE_PROVIDER_SITE_OTHER): Payer: Self-pay | Admitting: Ophthalmology

## 2020-05-31 ENCOUNTER — Other Ambulatory Visit: Payer: Self-pay

## 2020-05-31 ENCOUNTER — Ambulatory Visit (INDEPENDENT_AMBULATORY_CARE_PROVIDER_SITE_OTHER): Payer: 59 | Admitting: Ophthalmology

## 2020-05-31 DIAGNOSIS — H4312 Vitreous hemorrhage, left eye: Secondary | ICD-10-CM | POA: Diagnosis not present

## 2020-05-31 DIAGNOSIS — E113512 Type 2 diabetes mellitus with proliferative diabetic retinopathy with macular edema, left eye: Secondary | ICD-10-CM

## 2020-05-31 DIAGNOSIS — H25813 Combined forms of age-related cataract, bilateral: Secondary | ICD-10-CM

## 2020-05-31 DIAGNOSIS — E113411 Type 2 diabetes mellitus with severe nonproliferative diabetic retinopathy with macular edema, right eye: Secondary | ICD-10-CM | POA: Diagnosis not present

## 2020-05-31 DIAGNOSIS — I10 Essential (primary) hypertension: Secondary | ICD-10-CM

## 2020-05-31 DIAGNOSIS — H3581 Retinal edema: Secondary | ICD-10-CM

## 2020-05-31 DIAGNOSIS — H35033 Hypertensive retinopathy, bilateral: Secondary | ICD-10-CM

## 2020-05-31 MED ORDER — BEVACIZUMAB CHEMO INJECTION 1.25MG/0.05ML SYRINGE FOR KALEIDOSCOPE
1.2500 mg | INTRAVITREAL | Status: AC | PRN
Start: 2020-05-31 — End: 2020-05-31
  Administered 2020-05-31: 1.25 mg via INTRAVITREAL

## 2020-06-01 ENCOUNTER — Ambulatory Visit (INDEPENDENT_AMBULATORY_CARE_PROVIDER_SITE_OTHER): Payer: 59

## 2020-06-01 DIAGNOSIS — I255 Ischemic cardiomyopathy: Secondary | ICD-10-CM

## 2020-06-01 LAB — CUP PACEART REMOTE DEVICE CHECK
Battery Voltage: 2.62 V
Brady Statistic RV Percent Paced: 0.01 %
Date Time Interrogation Session: 20211027033327
HighPow Impedance: 323 Ohm
HighPow Impedance: 38 Ohm
HighPow Impedance: 47 Ohm
Implantable Lead Implant Date: 20130603
Implantable Lead Location: 753860
Implantable Lead Model: 6947
Implantable Pulse Generator Implant Date: 20130603
Lead Channel Impedance Value: 380 Ohm
Lead Channel Pacing Threshold Amplitude: 0.75 V
Lead Channel Pacing Threshold Pulse Width: 0.4 ms
Lead Channel Sensing Intrinsic Amplitude: 10.5 mV
Lead Channel Sensing Intrinsic Amplitude: 10.5 mV
Lead Channel Setting Pacing Amplitude: 2.5 V
Lead Channel Setting Pacing Pulse Width: 0.4 ms
Lead Channel Setting Sensing Sensitivity: 0.3 mV

## 2020-06-02 ENCOUNTER — Telehealth: Payer: Self-pay

## 2020-06-02 NOTE — Telephone Encounter (Signed)
[facility] alert received 06/01/20 that battery has reached ERI 06/01/20. Called patient to discuss patient needs to come in to talk to Dr. Caryl Comes about gen change.  No answer, LMOVM.

## 2020-06-06 ENCOUNTER — Ambulatory Visit (HOSPITAL_COMMUNITY)
Admission: RE | Admit: 2020-06-06 | Discharge: 2020-06-06 | Disposition: A | Discharge status: Home / Self Care | Payer: Medicare HMO | Source: Ambulatory Visit | Attending: Cardiology | Admitting: Cardiology

## 2020-06-06 ENCOUNTER — Encounter (HOSPITAL_COMMUNITY): Payer: Self-pay | Admitting: Cardiology

## 2020-06-06 ENCOUNTER — Other Ambulatory Visit: Payer: Self-pay

## 2020-06-06 ENCOUNTER — Telehealth (HOSPITAL_COMMUNITY): Payer: Self-pay

## 2020-06-06 VITALS — BP 130/80 | HR 75 | Wt 205.2 lb

## 2020-06-06 DIAGNOSIS — Z8249 Family history of ischemic heart disease and other diseases of the circulatory system: Secondary | ICD-10-CM | POA: Insufficient documentation

## 2020-06-06 DIAGNOSIS — Z7901 Long term (current) use of anticoagulants: Secondary | ICD-10-CM | POA: Diagnosis not present

## 2020-06-06 DIAGNOSIS — Z794 Long term (current) use of insulin: Secondary | ICD-10-CM | POA: Insufficient documentation

## 2020-06-06 DIAGNOSIS — E1136 Type 2 diabetes mellitus with diabetic cataract: Secondary | ICD-10-CM | POA: Diagnosis not present

## 2020-06-06 DIAGNOSIS — Z79899 Other long term (current) drug therapy: Secondary | ICD-10-CM | POA: Insufficient documentation

## 2020-06-06 DIAGNOSIS — I4892 Unspecified atrial flutter: Secondary | ICD-10-CM | POA: Insufficient documentation

## 2020-06-06 DIAGNOSIS — N183 Chronic kidney disease, stage 3 unspecified: Secondary | ICD-10-CM

## 2020-06-06 DIAGNOSIS — Z833 Family history of diabetes mellitus: Secondary | ICD-10-CM | POA: Diagnosis not present

## 2020-06-06 DIAGNOSIS — I480 Paroxysmal atrial fibrillation: Secondary | ICD-10-CM

## 2020-06-06 DIAGNOSIS — I255 Ischemic cardiomyopathy: Secondary | ICD-10-CM | POA: Diagnosis not present

## 2020-06-06 DIAGNOSIS — E1142 Type 2 diabetes mellitus with diabetic polyneuropathy: Secondary | ICD-10-CM | POA: Insufficient documentation

## 2020-06-06 DIAGNOSIS — I5042 Chronic combined systolic (congestive) and diastolic (congestive) heart failure: Secondary | ICD-10-CM | POA: Diagnosis not present

## 2020-06-06 DIAGNOSIS — I130 Hypertensive heart and chronic kidney disease with heart failure and stage 1 through stage 4 chronic kidney disease, or unspecified chronic kidney disease: Secondary | ICD-10-CM | POA: Insufficient documentation

## 2020-06-06 DIAGNOSIS — E1122 Type 2 diabetes mellitus with diabetic chronic kidney disease: Secondary | ICD-10-CM | POA: Insufficient documentation

## 2020-06-06 DIAGNOSIS — Z951 Presence of aortocoronary bypass graft: Secondary | ICD-10-CM | POA: Diagnosis not present

## 2020-06-06 DIAGNOSIS — Z95810 Presence of automatic (implantable) cardiac defibrillator: Secondary | ICD-10-CM | POA: Diagnosis not present

## 2020-06-06 DIAGNOSIS — I5022 Chronic systolic (congestive) heart failure: Secondary | ICD-10-CM | POA: Insufficient documentation

## 2020-06-06 DIAGNOSIS — N189 Chronic kidney disease, unspecified: Secondary | ICD-10-CM | POA: Diagnosis not present

## 2020-06-06 DIAGNOSIS — I2510 Atherosclerotic heart disease of native coronary artery without angina pectoris: Secondary | ICD-10-CM | POA: Insufficient documentation

## 2020-06-06 LAB — LIPID PANEL
Cholesterol: 141 mg/dL (ref 0–200)
HDL: 27 mg/dL — ABNORMAL LOW (ref >40)
LDL Cholesterol: 83 mg/dL (ref 0–99)
Total CHOL/HDL Ratio: 5.2 RATIO
Triglycerides: 155 mg/dL — ABNORMAL HIGH (ref <150)
VLDL: 31 mg/dL (ref 0–40)

## 2020-06-06 LAB — BASIC METABOLIC PANEL
Anion gap: 13 (ref 5–15)
BUN: 46 mg/dL — ABNORMAL HIGH (ref 6–20)
CO2: 27 mmol/L (ref 22–32)
Calcium: 10.1 mg/dL (ref 8.9–10.3)
Chloride: 100 mmol/L (ref 98–111)
Creatinine, Ser: 1.92 mg/dL — ABNORMAL HIGH (ref 0.61–1.24)
GFR, Estimated: 40 mL/min — ABNORMAL LOW (ref >60)
Glucose, Bld: 184 mg/dL — ABNORMAL HIGH (ref 70–99)
Potassium: 4.2 mmol/L (ref 3.5–5.1)
Sodium: 140 mmol/L (ref 135–145)

## 2020-06-06 LAB — DIGOXIN LEVEL: Digoxin Level: 0.4 ng/mL — ABNORMAL LOW (ref 1.0–2.0)

## 2020-06-06 MED ORDER — TORSEMIDE 20 MG PO TABS: ORAL_TABLET | ORAL | 3 refills | Status: DC

## 2020-06-06 MED ORDER — METOLAZONE 2.5 MG PO TABS: 2.5000 mg | ORAL_TABLET | ORAL | 3 refills | Status: DC

## 2020-06-06 NOTE — Telephone Encounter (Signed)
2nd attempt to call patient. LMOVM.

## 2020-06-06 NOTE — Telephone Encounter (Signed)
-----   Message from Larey Dresser, MD sent at 06/06/2020  3:08 PM EDT ----- Make sure he is taking Crestor 40 mg daily regularly.

## 2020-06-06 NOTE — Telephone Encounter (Signed)
Malena Edman, RN  06/06/2020 4:13 PM EDT Back to Top    Patient aware and states he is taking Crestor 40mg  daily

## 2020-06-06 NOTE — Progress Notes (Signed)
Remote ICD transmission.   

## 2020-06-06 NOTE — Progress Notes (Signed)
Patient ID: Gleen Ripberger., male   DOB: 1961/02/24, 59 y.o.   MRN: [id number] PCP: Dr. Sarajane Jews Cardiology: Dr. Aundra Dubin  HPI: Mr. Cape is a 59 y.o. male with h/o DM2, CAD s/p CABG 5974, systolic HF due to ischemic cardiomyopathy with EF 20-25% (echo 12/15), DM2 and CKD. He is s/p Medtronic single chamber ICD.  Admitted in 12/15 due to ADHF. Required short course milrinone for diuresis. Diuresed 30 pounds.   CPX 2/16 showed severely reduced functional capacity.  There was a significant disconnect between his symptoms and the CPX.  RHC in 6/16 showed fairly normal filling pressures and low but not markedly low cardiac output.   CPX (4/19) was submaximal but suggested severe limitation from HF.   He was admitted in 6/19 with marked volume overload and dyspnea.  Echo in 6/19 showed EF 20% with severe global hypokinesis.  LHC/RHC showed severe native vessel CAD with patent LIMA-D, SVG-OM, and SVG-PLV; cardiac index low.  He was started on milrinone and diuresed.  We discussed LVAD extensively in light of low output, creatinine that is trending up and concern for decompensation of RV over time. He was adamant that he did not want to pursue LVAD workup yet and did not want a referral for transplant evaluation. Milrinone was weaned off and he was discharged home.   Admitted 08/01/18-08/07/18 with volume overload and left foot wound. Diuresed 38 lbs with IV lasix and then transitioned back to torsemide 100 mg BID. ID was consulted with concerns for left foot osteomyelitis noted on CT scan. He got IV ancef q8 hours x 42 days. [facility] provided teaching and supplies for home antibiotics. Course complicated by AKI. DC weight 200 lbs.   He went into atrial flutter and had DCCV to NSR in 1/20.   Echo in 4/21 showed EF 25% with apical akinesis and diffuse hypokinesis relatively preserved in lateral wall, mildly decreased RV systolic function, PASP 41 mmHg. CPX in 5/21 showed severe functional limitation suggestive  of advanced HF. 5/21 Zio patch showed short atrial flutter runs, 3.8% PVCs.   He returns for followup of CHF.  Symptomatically, he still seems to be doing well.  Weight is unchanged.  Creatinine stable at 1.74 when last checked.  He gets mildly short of breath carrying groceries to his third floor apt but no problems with stairs when not carrying load.  No dyspnea walking on flat ground.  No chest pain.  No orthopnea/PND.  HgbA1c has been trending down.     Medtronic ICD interrogation: No VT.  Stable thoracic impedance.    Studies: Echo (12/15): EF 20-25% RV moderately HK CPX (2/16): RER 1.10, peak VO2 8.6, VE/VCO2 slope 43.7 => severely reduced functional capacity.  RHC (6/16): mean RA 8, PA 48/19 mean 30, mean PCWP 14, CI 2.03 Fick/2.28 Thermo, PVR 3.6.  Echo (5/17): EF 25%, mild LV dilation, moderate diastolic dysfunction, normal RV size with mild to moderate systolic dysfunction, mild mitral regurgitation CPX (4/19): Peak VO2 7, VE/VCO2 slope 41, RER 1.02 => submaximal but still suggestive of severe HF limitation.  Echo (6/19): EF 20%, diffuse severe HK, moderately decreased RV systolic function, moderate TR.  LHC/RHC (6/19): Severe native vessel CAD with patent LIMA-D, SVG-OM, and SVG-PLV; mean RA 15, PA 54/24 mean 37, mean PCWP 32, CI 1.86 Fick/1.64 thermo. PAPi 2, PVR 1.25 WU.  DC-CV 09/03/18 -->NSR  Echo (4/21): EF 25% with apical akinesis and diffuse hypokinesis relatively preserved in lateral wall, mildly decreased RV systolic function, PASP  41 mmHg. CPX (5/21): RER 1.1, VE/VCO2 slope 34, peak VO2 10.1 => severe functional limitation suggestive of severe HF limitation.  Zio (5/21): Zio patch showed short atrial flutter runs, 3.8% PVCs.   Labs:  8/15 LDL 88 12/15 K+ 4.2 creatinine 1.09  1/16 K 4.3, creatinine 1.34 4/16 K 4.5, creatinine 1.19 3/17 K 3.8, creatinine 1.22, HCT 35.3, BNP 1597 5/17 K 4.1, creatinine 1.25 6/18 LDL 138, K 4.4, creatinine 1.5 6/19 hgb 13.5, K 4.9,  creatinine 1.53 => 1.5 => 1.4, BNP 992 08/07/2018: K 4.3 Creatinine 1.86  6/20: LDL 98, TSH normal, Hgb 13.5, K 4.3, creatinine 2.05 7/20: K 4.2, creatinine 2.56 => 2.03 8/20: LDL 34, HDL 22 3/21: K 4.1, creatinine 1.92 4/21: K 4, creatinine 1.96 6/21: K 5.2, creatinine 1.76 9/21: K 4.1, creatinine 1.74, hgb 14.1, digoxin < 0.2  Review of systems complete and found to be negative unless listed in HPI.   SH:  Social History   Socioeconomic History  . Marital status: Divorced    Spouse name: Not on file  . Number of children: Not on file  . Years of education: Not on file  . Highest education level: Not on file  Occupational History  . Not on file  Tobacco Use  . Smoking status: Never Smoker  . Smokeless tobacco: Never Used  Vaping Use  . Vaping Use: Never used  Substance and Sexual Activity  . Alcohol use: No    Alcohol/week: 0.0 standard drinks  . Drug use: No  . Sexual activity: Not on file  Other Topics Concern  . Not on file  Social History Narrative   Lives in [place] with his son and his family.  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   Social Determinants of Health   Financial Resource Strain:   . Difficulty of Paying Living Expenses: Not on file  Food Insecurity:   . Worried About Charity fundraiser in the Last Year: Not on file  . Ran Out of Food in the Last Year: Not on file  Transportation Needs:   . Lack of Transportation (Medical): Not on file  . Lack of Transportation (Non-Medical): Not on file  Physical Activity:   . Days of Exercise per Week: Not on file  . Minutes of Exercise per Session: Not on file  Stress:   . Feeling of Stress : Not on file  Social Connections:   . Frequency of Communication with Friends and Family: Not on file  . Frequency of Social Gatherings with Friends and Family: Not on file  . Attends Religious Services: Not on file  . Active Member of Clubs or Organizations: Not on file  . Attends English as a second language teacher Meetings: Not on file  . Marital Status: Not on file  Intimate Partner Violence:   . Fear of Current or Ex-Partner: Not on file  . Emotionally Abused: Not on file  . Physically Abused: Not on file  . Sexually Abused: Not on file    FH:  Family History  Problem Relation Age of Onset  . Diabetes Father        died in his 26's  . Hypertension Mother        died in her 22's - had a ppm.  . Amblyopia Neg Hx   . Blindness Neg Hx   . Cataracts Neg Hx   . Glaucoma Neg Hx   . Macular degeneration Neg Hx   . Retinal detachment  Neg Hx   . Strabismus Neg Hx   . Retinitis pigmentosa Neg Hx     Past Medical History:  Diagnosis Date  . AICD (automatic cardioverter/defibrillator) present    Single-chamber  implantable cardiac defibrillator - Medtronic  . Cataract    Mixed form OU  . CHF (congestive heart failure) ([place])   . Chronic systolic heart failure ([place])    a. Echo 5/13: Mild LVE, mild LVH, EF 10%, anteroseptal, lateral, apical AK, mild MR, mild LAE, moderately reduced RVSF, mild RAE, PASP 60;  b. 07/2014 Echo: EF 20-2%, diff HK, AKI of antsep/apical/mid-apicalinferior, mod reduced RV.  Marland Kitchen Coronary artery disease    a. s/p CABG 2002 b. LHC 5/13:  dLM 80%, LAD subtotally occluded, pCFX occluded, pRCA 50%, mid? Occlusion with high take off of the PDA with 70% multiple lesions-not bypassed and supplies collaterals to LAD, LIMA-IM/ramus ok, S-OM ok, S-PLA branches ok. Medical therapy was recommended  . Diabetic retinopathy (HCC)    NPDR OD, PDR OS  . Gout    "on daily RX" (01/08/2018)  . Hypertension   . Hypertensive retinopathy    OU  . Ischemic cardiomyopathy    a. Echo 5/13: Mild LVE, mild LVH, EF 10%, anteroseptal, lateral, apical AK, mild MR, mild LAE, moderately reduced RVSF, mild RAE, PASP 60;  b. 01/2012 s/p MDT [id number] Protecta XT VR AICD;  c. 07/2014 Echo: EF 20-2%, diff HK, AKI of antsep/apical/mid-apicalinferior, mod reduced RV.  Marland Kitchen MRSA (methicillin resistant  Staphylococcus aureus)    Status post right foot plantar deep infection with MRSA status post  I&D 10/2008  . Myocardial infarction [facility])    "was told I'd had several before heart OR in 2002" (01/08/2018)  . Noncompliance   . Peripheral neuropathy   . Syncope   . Type II diabetes mellitus ([place])    requiring insulin   . Vitreous hemorrhage, left (HCC)    and proliferative diabetic retinopathy    Current Outpatient Medications  Medication Sig Dispense Refill  . allopurinol (ZYLOPRIM) 300 MG tablet Take 600 mg by mouth daily as needed (gout).    . blood glucose meter kit and supplies KIT Use as directed. 1 each 0  . carvedilol (COREG) 12.5 MG tablet TAKE 1 TABLET (12.5 MG TOTAL) BY MOUTH 2 (TWO) TIMES DAILY. 30 tablet 11  . dapagliflozin propanediol (FARXIGA) 10 MG TABS tablet Take 10 mg by mouth daily before breakfast. 30 tablet 6  . digoxin (LANOXIN) 0.125 MG tablet TAKE 1 TABLET BY MOUTH EVERY DAY 90 tablet 3  . ELIQUIS 5 MG TABS tablet TAKE 1 TABLET BY MOUTH TWICE A DAY 60 tablet 6  . glucose blood test strip Use to test blood sugar twice a day 100 each 3  . insulin aspart (NOVOLOG FLEXPEN) 100 UNIT/ML FlexPen INJECT 5 UNITS INTO THE SKIN 3 TIMES DAILY WITH MEALS (Patient taking differently: Inject 5-10 Units into the skin 3 (three) times daily with meals. ) 15 mL 11  . insulin glargine (LANTUS SOLOSTAR) 100 UNIT/ML Solostar Pen INJECT 50 UNITS INTO THE SKIN AT BEDTIME. 15 mL 11  . Insulin Pen Needle (BD PEN NEEDLE NANO U/F) 32G X 4 MM MISC USE 4 TIMES A DAY 100 each 11  . ketoconazole (NIZORAL) 2 % cream Apply 1 application topically 2 (two) times daily as needed for irritation. 30 g 5  . KLOR-CON M20 20 MEQ tablet TAKE 1 TABLET (20 MEQ TOTAL) BY MOUTH DAILY. TAKE 1 TABLET EXTRA ON DAYS YOU TAKE  METOLAZONE. (Patient taking differently: Take 20-40 mEq by mouth See admin instructions. Take 20 meq daily except on Saturday take 40 meq with the metolazone) 90 tablet 3  . Lancets (ONETOUCH  ULTRASOFT) lancets Use to test blood sugar twice a day 100 each 3  . metolazone (ZAROXOLYN) 2.5 MG tablet Take 1 tablet (2.5 mg total) by mouth once a week. On Saturdays 5 tablet 3  . rosuvastatin (CRESTOR) 40 MG tablet TAKE 1 TABLET BY MOUTH EVERY DAY (Patient taking differently: Take 40 mg by mouth daily. ) 90 tablet 3  . sacubitril-valsartan (ENTRESTO) 97-103 MG Take 1 tablet by mouth 2 (two) times daily. 60 tablet 6  . spironolactone (ALDACTONE) 25 MG tablet TAKE 1 TABLET BY MOUTH EVERY DAY (Patient taking differently: Take 25 mg by mouth daily. ) 90 tablet 0  . torsemide (DEMADEX) 20 MG tablet Take 4 tablets (80 mg total) by mouth in the morning AND 2 tablets (40 mg total) daily after supper. 180 tablet 3   No current facility-administered medications for this encounter.    Vitals:   06/06/20 0846  BP: 130/80  Pulse: 75  SpO2: 100%  Weight: 93.1 kg (205 lb 3.2 oz)   Wt Readings from Last 3 Encounters:  06/06/20 93.1 kg (205 lb 3.2 oz)  04/13/20 93.3 kg (205 lb 9.6 oz)  04/06/20 93 kg (205 lb)    PHYSICAL EXAM: General: NAD Neck: No JVD, no thyromegaly or thyroid nodule.  Lungs: Clear to auscultation bilaterally with normal respiratory effort. CV: Nondisplaced PMI.  Heart regular S1/S2, no S3/S4, no murmur.  No peripheral edema.  No carotid bruit.  Normal pedal pulses.  Abdomen: Soft, nontender, no hepatosplenomegaly, no distention.  Skin: Intact without lesions or rashes.  Neurologic: Alert and oriented x 3.  Psych: Normal affect. Extremities: No clubbing or cyanosis.  HEENT: Normal.   ASSESSMENT & PLAN: 1.Chronic systolic HF:ICM.HasMedtronic ICD. Narrow QRS, not CRT candidate. Has required milrinone multiple times in the past for low output HF. Offered advanced therapies in past but declined. Echo 6/19 with EF 20% and moderately decreased RV systolic function.CPX 4/19 with severe functional limitation due to [facility] Hights 6/19 showed low output and coronary angiography  showed no interventional targets.Echo 08/04/18 with EF 20-25%, mild LVH, diffuse HK, akinesis of anteroseptal and anterior myocardium, grade 2 DD, trivial AI, LA mod dilated, RV moderately dilated, RA mildly dilated, moderate TR, PA peak pressure 49 mm Hg.Echo in 4/21 showed EF 25%, mildly decreased RV systolic function.  CPX in 5/21 showed severe HF limitation.  He is not volume overloaded by exam or Optivol.  NYHA class II symptoms.  Symptomatically stable despite severe disease.  History of cardiorenal syndrome/diabetic nephropathy, last creatinine 1.74.    - Continue torsemide 80 qam/40 qpm with 2.5 mg metolazone once weekly.  BMET today.  - Continue Entresto 97/103 bid.   - Continuespironolactone25 mg daily.  -Intolerant ivabradine due to nausea. - Continue Correg 12.5 mg BID - Continue digoxin0.125 mg daily. Check digoxin level today.  - Continue dapafliglozin 10 mg daily.    - Has been very resistant to LVAD/transplant evaluation, both offered with multiple discussions in past.  I worry that we will miss the window for advanced therapies.  He seems to be stable symptomatically today, however.  - He was not interested in baroreflex activation therapy.  - QRS not wide, not CRT candidate.  2. CAD s/p CABG2002:Last cath in 6/19 with patent grafts. No chest pain.  - No ASA given apixaban  use.  - Continue Crestor 40 mg daily.  3. CKD Stage 3:Creatinine 1.74 when checked most recently. I suspect that this is a combination of cardiorenal syndrome and diabetic nephropathy.  - BMET today.   - I have recommended nephrology referral but he does not want this yet due to finances.   4. DM: History of very poor control but hgbA1c now trending down.  Followup with PCP.  5. Atrial flutter: s/p DC-CV 08/21/18.  He is in NSR today, denies palpitations.  - Continue Eliquis.   Followup 3 months.   Loralie Champagne, MD 06/06/2020

## 2020-06-06 NOTE — Patient Instructions (Addendum)
Labs done today, your results will be available in MyChart, we will contact you for abnormal readings.  A refill for metolazone was sent to your pharmacy  Your physician recommends that you return for a follow up visit in 3 months  If you have any questions or concerns before your next appointment please send [place] a message through [place] or call our office at [phone].    TO LEAVE A MESSAGE FOR THE NURSE SELECT OPTION 2, PLEASE LEAVE A MESSAGE INCLUDING: . YOUR NAME . DATE OF BIRTH . CALL BACK NUMBER . REASON FOR CALL**this is important as we prioritize the call backs  YOU WILL RECEIVE A CALL BACK THE SAME DAY AS LONG AS YOU CALL BEFORE 4:00 PM

## 2020-06-10 NOTE — Telephone Encounter (Signed)
LMOM with DC # and hours for return call. Device at RRT needs to see Dr Caryl Comes to discuss.

## 2020-06-14 NOTE — Telephone Encounter (Signed)
LMOM per DPR that patient needs to call office ASAP to discuss battery replacement in device. DC # and hours provided.

## 2020-06-15 NOTE — Telephone Encounter (Signed)
Pt returned phone call.  Advised of ERI status.  He has not received ny auditory alert from the device, alsouth it appears there was a CLE interrogation at [facility] clinic on 11/1 which may have cleared the alerts.  Pt v/u that he may still get the alert and if it does occur to call the office.    Pt had telehealth visit with Dr. Caryl Comes on 02/25/20 and confirms that ICD generator change was discussed, he is ready to schedule procedure

## 2020-06-15 NOTE — Telephone Encounter (Signed)
Spoke with pt and advised will need to schedule appointment for H&P, EKG and labs as pt has reached ERI for his ICD.  Appointment scheduled for 06/23/2020 at 940am with Amber Seiler,NP. Discussed available surgical dates for Dr Caryl Comes. Pt will consider 07/04/2020 for ICD generator change.  Date held with cath lab with arrival time of 10am for procedure start time of 12 noon.  Pt verbalizes understanding and agrees with current plan.

## 2020-06-17 ENCOUNTER — Other Ambulatory Visit (HOSPITAL_COMMUNITY): Payer: Self-pay | Admitting: Cardiology

## 2020-06-23 ENCOUNTER — Encounter: Payer: Self-pay | Admitting: Nurse Practitioner

## 2020-06-23 ENCOUNTER — Ambulatory Visit (INDEPENDENT_AMBULATORY_CARE_PROVIDER_SITE_OTHER): Payer: 59 | Admitting: Nurse Practitioner

## 2020-06-23 ENCOUNTER — Other Ambulatory Visit: Payer: Self-pay

## 2020-06-23 VITALS — BP 106/60 | HR 80 | Ht 70.5 in | Wt 205.6 lb

## 2020-06-23 DIAGNOSIS — D696 Thrombocytopenia, unspecified: Secondary | ICD-10-CM | POA: Diagnosis not present

## 2020-06-23 DIAGNOSIS — I255 Ischemic cardiomyopathy: Secondary | ICD-10-CM | POA: Diagnosis not present

## 2020-06-23 DIAGNOSIS — I480 Paroxysmal atrial fibrillation: Secondary | ICD-10-CM

## 2020-06-23 DIAGNOSIS — I5022 Chronic systolic (congestive) heart failure: Secondary | ICD-10-CM | POA: Diagnosis not present

## 2020-06-23 LAB — BASIC METABOLIC PANEL
BUN/Creatinine Ratio: 22 — ABNORMAL HIGH (ref 9–20)
BUN: 46 mg/dL — ABNORMAL HIGH (ref 6–24)
CO2: 22 mmol/L (ref 20–29)
Calcium: 9.5 mg/dL (ref 8.7–10.2)
Chloride: 99 mmol/L (ref 96–106)
Creatinine, Ser: 2.05 mg/dL — ABNORMAL HIGH (ref 0.76–1.27)
GFR calc Af Amer: 40 mL/min/{1.73_m2} — ABNORMAL LOW (ref >59)
GFR calc non Af Amer: 34 mL/min/{1.73_m2} — ABNORMAL LOW (ref >59)
Glucose: 171 mg/dL — ABNORMAL HIGH (ref 65–99)
Potassium: 3.8 mmol/L (ref 3.5–5.2)
Sodium: 140 mmol/L (ref 134–144)

## 2020-06-23 LAB — CUP PACEART INCLINIC DEVICE CHECK
Date Time Interrogation Session: 20211118100103
Implantable Lead Implant Date: 20130603
Implantable Lead Location: 753860
Implantable Lead Model: 6947
Implantable Pulse Generator Implant Date: 20130603

## 2020-06-23 LAB — CBC WITH DIFFERENTIAL/PLATELET
Basophils Absolute: 0 10*3/uL (ref 0.0–0.2)
Basos: 0 %
EOS (ABSOLUTE): 0.1 10*3/uL (ref 0.0–0.4)
Eos: 1 %
Hematocrit: 41 % (ref 37.5–51.0)
Hemoglobin: 13.1 g/dL (ref 13.0–17.7)
Immature Grans (Abs): 0 10*3/uL (ref 0.0–0.1)
Immature Granulocytes: 0 %
Lymphocytes Absolute: 2.5 10*3/uL (ref 0.7–3.1)
Lymphs: 28 %
MCH: 25.2 pg — ABNORMAL LOW (ref 26.6–33.0)
MCHC: 32 g/dL (ref 31.5–35.7)
MCV: 79 fL (ref 79–97)
Monocytes Absolute: 0.7 10*3/uL (ref 0.1–0.9)
Monocytes: 8 %
Neutrophils Absolute: 5.6 10*3/uL (ref 1.4–7.0)
Neutrophils: 63 %
Platelets: 281 10*3/uL (ref 150–450)
RBC: 5.2 x10E6/uL (ref 4.14–5.80)
RDW: 15 % (ref 11.6–15.4)
WBC: 8.9 10*3/uL (ref 3.4–10.8)
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
## 2020-06-23 NOTE — Patient Instructions (Addendum)
Medication Instructions:  *If you need a refill on your cardiac medications before your next appointment, please call your pharmacy*  Lab Work: Your physician has recommended that you have lab work today: BMET and CBC  If you have labs (blood work) drawn today and your tests are completely normal, you will receive your results only by: Marland Kitchen MyChart Message (if you have MyChart) OR . A paper copy in the mail If you have any lab test that is abnormal or we need to change your treatment, we will call you to review the results.  Procedures/Testing: You will need to have a pre procedure Covid Screening Test on 07/02/20 at 11:15 am --This is a Drive Up Visit at [address] [place], [place] [address].  --Someone will direct you to the appropriate testing line. Stay in your car and someone will be with you shortly.  Your physician has recommended that you have a Generator Change of your device. This is a procedure that replaces a Pacemaker or ICD generator that is at the end of its service life. The remaining lifespan of a pacemaker is determined during visits to the [facility]. The battery in a pacemaker does not stop suddenly but rather loses its charge slowly, which lets the cardiologist plan the replacement date.  Follow-Up: At [facility], you and your health needs are our priority.  As part of our continuing mission to provide you with exceptional heart care, we have created designated Provider Care Teams.  These Care Teams include your primary Cardiologist (physician) and Advanced Practice Providers (APPs -  Physician Assistants and Nurse Practitioners) who all work together to provide you with the care you need, when you need it.  We recommend signing up for the patient portal called "MyChart".  Sign up information is provided on this After Visit Summary.  MyChart is used to connect with patients for Virtual Visits (Telemedicine).  Patients are able to view lab/test results, encounter  notes, upcoming appointments, etc.  Non-urgent messages can be sent to your provider as well.   To learn more about what you can do with MyChart, go to [email or web address].    Your next appointment:   Your physician recommends that you schedule a follow-up appointment in: 10-14 days from 07/04/20 with [facility] for a wound check  Your physician recommends that you schedule a follow-up appointment in: 91 days from 07/04/20 with Dr Caryl Comes  The format for your next appointment:   In Person with [facility] and Dr. Caryl Comes   --------------------------------------------------------------------------------------------------------------------------------------------------- Please wash with the CHG Soap the night before and morning of procedure (follow instruction page "Preparing For Surgery").   -- Please report to the [place] Entrance of [facility] at 10:00 am  ([place], [place] [address])  -- DO NOT eat or drink anything after midnight the night before procedure  -- YOU WILL NEED TO HOLD YOUR ELIQUIS FOR 48 HOURS PRIOR TO YOUR PROCEDURE (DO NOT TAKE ON 07/02/20 or 07/03/20.)  You may resume after your procedure   -- You may take all of your morning medications the day of your procedure with enough water to get them down safely.  -- You will need someone to drive you home after the procedure
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
## 2020-06-27 ENCOUNTER — Telehealth: Payer: Self-pay

## 2020-06-27 NOTE — Telephone Encounter (Signed)
-----   Message from Patsey Berthold, NP sent at 06/24/2020  5:33 AM EST ----- Please notify patient of stable pre procedure labs. Thanks!

## 2020-06-27 NOTE — Telephone Encounter (Signed)
Pt is aware and agreeable to stable pre-procedure labs Pt is aware and agreeable to gen change procedure as scheduled with Dr. Caryl Comes.
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
## 2020-06-28 ENCOUNTER — Encounter (INDEPENDENT_AMBULATORY_CARE_PROVIDER_SITE_OTHER): Payer: Self-pay | Admitting: Ophthalmology

## 2020-06-28 ENCOUNTER — Other Ambulatory Visit: Payer: Self-pay

## 2020-06-28 ENCOUNTER — Ambulatory Visit (INDEPENDENT_AMBULATORY_CARE_PROVIDER_SITE_OTHER): Payer: 59 | Admitting: Ophthalmology

## 2020-06-28 DIAGNOSIS — I10 Essential (primary) hypertension: Secondary | ICD-10-CM

## 2020-06-28 DIAGNOSIS — E113411 Type 2 diabetes mellitus with severe nonproliferative diabetic retinopathy with macular edema, right eye: Secondary | ICD-10-CM

## 2020-06-28 DIAGNOSIS — H25813 Combined forms of age-related cataract, bilateral: Secondary | ICD-10-CM

## 2020-06-28 DIAGNOSIS — E113512 Type 2 diabetes mellitus with proliferative diabetic retinopathy with macular edema, left eye: Secondary | ICD-10-CM | POA: Diagnosis not present

## 2020-06-28 DIAGNOSIS — H3581 Retinal edema: Secondary | ICD-10-CM | POA: Diagnosis not present

## 2020-06-28 DIAGNOSIS — H4312 Vitreous hemorrhage, left eye: Secondary | ICD-10-CM

## 2020-06-28 DIAGNOSIS — H35033 Hypertensive retinopathy, bilateral: Secondary | ICD-10-CM

## 2020-06-28 MED ORDER — BEVACIZUMAB CHEMO INJECTION 1.25MG/0.05ML SYRINGE FOR KALEIDOSCOPE
1.2500 mg | INTRAVITREAL | Status: AC | PRN
  Administered 2020-06-28: 1.25 mg via INTRAVITREAL

## 2020-07-01 NOTE — Progress Notes (Signed)
Attempted to call patient regarding procedure instructions for tomorrow.  Left voicemail on the following items: Arrival time 1000 Nothing to eat or drink after midnight No meds AM of procedure Responsible person to drive you home and stay with you for 24 hrs Wash with special soap night before and morning of procedure If on anti-coagulant drug instructions Eliquis-none on Saturday or Sunday

## 2020-07-02 ENCOUNTER — Other Ambulatory Visit (HOSPITAL_COMMUNITY)
Admission: RE | Admit: 2020-07-02 | Discharge: 2020-07-02 | Disposition: A | Discharge status: Home / Self Care | Payer: Medicare HMO | Source: Ambulatory Visit | Attending: Internal Medicine | Admitting: Internal Medicine

## 2020-07-02 DIAGNOSIS — Z01818 Encounter for other preprocedural examination: Secondary | ICD-10-CM | POA: Diagnosis not present

## 2020-07-02 DIAGNOSIS — Z20822 Contact with and (suspected) exposure to covid-19: Secondary | ICD-10-CM | POA: Insufficient documentation

## 2020-07-03 LAB — SARS CORONAVIRUS 2 (TAT 6-24 HRS): SARS Coronavirus 2: NEGATIVE

## 2020-07-04 ENCOUNTER — Other Ambulatory Visit: Payer: Self-pay

## 2020-07-04 ENCOUNTER — Ambulatory Visit (HOSPITAL_COMMUNITY)
Admission: RE | Admit: 2020-07-04 | Discharge: 2020-07-04 | Disposition: A | Discharge status: Home / Self Care | Payer: Medicare HMO | Attending: Internal Medicine | Admitting: Internal Medicine

## 2020-07-04 ENCOUNTER — Encounter (HOSPITAL_COMMUNITY)
Admission: RE | Disposition: A | Discharge status: Home / Self Care | Payer: Self-pay | Source: Home / Self Care | Attending: Internal Medicine

## 2020-07-04 DIAGNOSIS — I5022 Chronic systolic (congestive) heart failure: Secondary | ICD-10-CM | POA: Insufficient documentation

## 2020-07-04 DIAGNOSIS — I110 Hypertensive heart disease with heart failure: Secondary | ICD-10-CM | POA: Diagnosis not present

## 2020-07-04 DIAGNOSIS — Z951 Presence of aortocoronary bypass graft: Secondary | ICD-10-CM | POA: Diagnosis not present

## 2020-07-04 DIAGNOSIS — Z4502 Encounter for adjustment and management of automatic implantable cardiac defibrillator: Secondary | ICD-10-CM | POA: Insufficient documentation

## 2020-07-04 DIAGNOSIS — I4892 Unspecified atrial flutter: Secondary | ICD-10-CM | POA: Diagnosis not present

## 2020-07-04 DIAGNOSIS — Z79899 Other long term (current) drug therapy: Secondary | ICD-10-CM | POA: Insufficient documentation

## 2020-07-04 DIAGNOSIS — I4891 Unspecified atrial fibrillation: Secondary | ICD-10-CM | POA: Diagnosis not present

## 2020-07-04 DIAGNOSIS — Z7901 Long term (current) use of anticoagulants: Secondary | ICD-10-CM | POA: Diagnosis not present

## 2020-07-04 DIAGNOSIS — I255 Ischemic cardiomyopathy: Secondary | ICD-10-CM | POA: Diagnosis not present

## 2020-07-04 HISTORY — PX: ICD GENERATOR CHANGEOUT: EP1231

## 2020-07-04 LAB — GLUCOSE, CAPILLARY: Glucose-Capillary: 164 mg/dL — ABNORMAL HIGH (ref 70–99)

## 2020-07-04 SURGERY — ICD GENERATOR CHANGEOUT

## 2020-07-04 MED ORDER — CEFAZOLIN SODIUM-DEXTROSE 2-4 GM/100ML-% IV SOLN
2.0000 g | INTRAVENOUS | Status: AC
  Administered 2020-07-04: 2 g via INTRAVENOUS

## 2020-07-04 MED ORDER — FENTANYL CITRATE (PF) 100 MCG/2ML IJ SOLN
INTRAMUSCULAR | Status: AC
  Filled 2020-07-04: qty 2

## 2020-07-04 MED ORDER — FENTANYL CITRATE (PF) 100 MCG/2ML IJ SOLN
INTRAMUSCULAR | Status: DC | PRN
  Administered 2020-07-04 (×2): 25 ug via INTRAVENOUS

## 2020-07-04 MED ORDER — LIDOCAINE HCL (PF) 1 % IJ SOLN
INTRAMUSCULAR | Status: DC | PRN
  Administered 2020-07-04: 60 mL

## 2020-07-04 MED ORDER — MIDAZOLAM HCL 5 MG/5ML IJ SOLN
INTRAMUSCULAR | Status: DC | PRN
  Administered 2020-07-04: 1 mg via INTRAVENOUS
  Administered 2020-07-04: 2 mg via INTRAVENOUS

## 2020-07-04 MED ORDER — CHLORHEXIDINE GLUCONATE 4 % EX LIQD
4.0000 "application " | Freq: Once | CUTANEOUS | Status: DC
  Filled 2020-07-04: qty 60

## 2020-07-04 MED ORDER — SODIUM CHLORIDE 0.9 % IV SOLN
80.0000 mg | INTRAVENOUS | Status: AC
  Administered 2020-07-04: 80 mg

## 2020-07-04 MED ORDER — SODIUM CHLORIDE 0.9 % IV SOLN: INTRAVENOUS | Status: DC

## 2020-07-04 MED ORDER — CEFAZOLIN SODIUM-DEXTROSE 2-4 GM/100ML-% IV SOLN
INTRAVENOUS | Status: AC
  Filled 2020-07-04: qty 100

## 2020-07-04 MED ORDER — SODIUM CHLORIDE 0.9 % IV SOLN
INTRAVENOUS | Status: AC
  Filled 2020-07-04: qty 2

## 2020-07-04 MED ORDER — LIDOCAINE HCL (PF) 1 % IJ SOLN
INTRAMUSCULAR | Status: AC
  Filled 2020-07-04: qty 30

## 2020-07-04 MED ORDER — MIDAZOLAM HCL 5 MG/5ML IJ SOLN
INTRAMUSCULAR | Status: AC
  Filled 2020-07-04: qty 5

## 2020-07-04 MED ORDER — ACETAMINOPHEN 325 MG PO TABS: 325.0000 mg | ORAL_TABLET | ORAL | Status: DC | PRN

## 2020-07-04 SURGICAL SUPPLY — 7 items
CABLE SURGICAL S-101-97-12 (CABLE) ×2 IMPLANT
HEMOSTAT SURGICEL 2X4 FIBR (HEMOSTASIS) ×1 IMPLANT
ICD VISIA MRI VR DVFB1D4 (ICD Generator) IMPLANT
KIT LEAD ACCESSORY 6056M PINCH (KITS) ×1 IMPLANT
PAD PRO RADIOLUCENT 2001M-C (PAD) ×2 IMPLANT
TRAY PACEMAKER INSERTION (PACKS) ×2 IMPLANT
VISIA MRI VR DVFB1D4 (ICD Generator) ×2 IMPLANT

## 2020-07-04 NOTE — Progress Notes (Signed)
Called Dr Caryl Comes and per Dr Caryl Comes client to resume eliquis Wednesday am and client and his son notified and voiced understanding

## 2020-07-04 NOTE — Interval H&P Note (Signed)
History and Physical Interval Note:  07/04/2020 12:16 PM  Marcus Johnson.  has presented today for surgery, with the diagnosis of eri.  The various methods of treatment have been discussed with the patient and family. After consideration of risks, benefits and other options for treatment, the patient has consented to  Procedure(s): ICD [place] (N/A) as a surgical intervention.  The patient's history has been reviewed, patient examined, no change in status, stable for surgery.  I have reviewed the patient's chart and labs.  Questions were answered to the patient's satisfaction.     Virl Axe  Pt seen and examined and generator replacement discussed He is agreeable to proceeding

## 2020-07-04 NOTE — Discharge Instructions (Signed)
RESTART ELIQUIS Wednesday MORNING   KEEP SITE DRY UNTIL TOMORROW EVENING; DO NOT DRIVE FOR 4 DAYS  Pacemaker Battery Change, Care After This sheet gives you information about how to care for yourself after your procedure. Your health care provider may also give you more specific instructions. If you have problems or questions, contact your health care provider. What can I expect after the procedure? After your procedure, it is common to have:  Pain or soreness at the site where the pacemaker was inserted.  Swelling at the site where the pacemaker was inserted. Follow these instructions at home: Incision care   Keep the incision clean and dry. ? You may shower the day after your procedure, or as directed by your health care provider. ? Pat the area dry with a clean towel. Do not rub the area. This may cause bleeding.  Follow instructions from your health care provider about how to take care of your incision. Make sure you: ? Wash your hands with soap and water before you change your bandage (dressing). If soap and water are not available, use hand sanitizer. ? Change your dressing as told by your health care provider.   Check your incision area every day for signs of infection. Check for: ? More redness, swelling, or pain. ? More fluid or blood. ? Warmth. ? Pus or a bad smell. Activity  Do not lift anything that is heavier than 10 lb (4.5 kg) until your health care provider says it is okay to do so.  For the first 2 weeks, or as long as told by your health care provider: ? Avoid lifting your left arm higher than your shoulder. ? Be gentle when you move your arms over your head. It is okay to raise your arm to comb your hair. ? Avoid strenuous exercise.  Ask your health care provider when it is okay to: ? Resume your normal activities. ? Return to work or school. ? Resume sexual activity. Eating and drinking  Eat a heart-healthy diet. This should include plenty of fresh  fruits and vegetables, whole grains, low-fat dairy products, and lean protein like chicken and fish.  Limit alcohol intake to no more than 1 drink a day for non-pregnant women and 2 drinks a day for men. One drink equals 12 oz of beer, 5 oz of wine, or 1 oz of hard liquor.  Check ingredients and nutrition facts on packaged foods and beverages. Avoid the following types of food: ? Food that is high in salt (sodium). ? Food that is high in saturated fat, like full-fat dairy or red meat. ? Food that is high in trans fat, like fried food. ? Food and drinks that are high in sugar. Lifestyle  Do not use any products that contain nicotine or tobacco, such as cigarettes and e-cigarettes. If you need help quitting, ask your health care provider.  Take steps to manage and control your weight.  Get regular exercise. Aim for 150 minutes of moderate-intensity exercise (such as walking or yoga) or 75 minutes of vigorous exercise (such as running or swimming) each week.  Manage other health problems, such as diabetes or high blood pressure. Ask your health care provider how you can manage these conditions. General instructions  Do not drive for 24 hours after your procedure if you were given a medicine to help you relax (sedative).  Take over-the-counter and prescription medicines only as told by your health care provider.  Avoid putting pressure on the area where the  pacemaker was placed.  If you need an MRI after your pacemaker has been placed, be sure to tell the health care provider who orders the MRI that you have a pacemaker.  Avoid close and prolonged exposure to electrical devices that have strong magnetic fields. These include: ? Cell phones. Avoid keeping them in a pocket near the pacemaker, and try using the ear opposite the pacemaker. ? MP3 players. ? Household appliances, like microwaves. ? Metal detectors. ? Electric generators. ? High-tension wires.  Keep all follow-up visits as  directed by your health care provider. This is important. Contact a health care provider if:  You have pain at the incision site that is not relieved by over-the-counter or prescription medicines.  You have any of these around your incision site or coming from it: ? More redness, swelling, or pain. ? Fluid or blood. ? Warmth to the touch. ? Pus or a bad smell.  You have a fever.  You feel brief, occasional palpitations, light-headedness, or any symptoms that you think might be related to your heart. Get help right away if:  You experience chest pain that is different from the pain at the pacemaker site.  You develop a red streak that extends above or below the incision site.  You experience shortness of breath.  You have palpitations or an irregular heartbeat.  You have light-headedness that does not go away quickly.  You faint or have dizzy spells.  Your pulse suddenly drops or increases rapidly and does not return to normal.  You begin to gain weight and your legs and ankles swell. Summary  After your procedure, it is common to have pain, soreness, and some swelling where the pacemaker was inserted.  Make sure to keep your incision clean and dry. Follow instructions from your health care provider about how to take care of your incision.  Check your incision every day for signs of infection, such as more pain or swelling, pus or a bad smell, warmth, or leaking fluid and blood.  Avoid strenuous exercise and lifting your left arm higher than your shoulder for 2 weeks, or as long as told by your health care provider. This information is not intended to replace advice given to you by your health care provider. Make sure you discuss any questions you have with your health care provider. Document Revised: 07/05/2017 Document Reviewed: 06/14/2016 Elsevier Patient Education  2020 [employer].

## 2020-07-05 ENCOUNTER — Encounter (HOSPITAL_COMMUNITY): Payer: Self-pay | Admitting: Internal Medicine

## 2020-07-12 ENCOUNTER — Encounter: Payer: Self-pay | Admitting: Family Medicine

## 2020-07-12 DIAGNOSIS — H2512 Age-related nuclear cataract, left eye: Secondary | ICD-10-CM | POA: Diagnosis not present

## 2020-07-12 DIAGNOSIS — H25013 Cortical age-related cataract, bilateral: Secondary | ICD-10-CM | POA: Diagnosis not present

## 2020-07-12 DIAGNOSIS — H25042 Posterior subcapsular polar age-related cataract, left eye: Secondary | ICD-10-CM | POA: Diagnosis not present

## 2020-07-12 DIAGNOSIS — E113593 Type 2 diabetes mellitus with proliferative diabetic retinopathy without macular edema, bilateral: Secondary | ICD-10-CM | POA: Diagnosis not present

## 2020-07-12 DIAGNOSIS — H2513 Age-related nuclear cataract, bilateral: Secondary | ICD-10-CM | POA: Diagnosis not present

## 2020-07-12 LAB — HM DIABETES EYE EXAM

## 2020-07-19 ENCOUNTER — Other Ambulatory Visit: Payer: Self-pay

## 2020-07-19 ENCOUNTER — Ambulatory Visit (INDEPENDENT_AMBULATORY_CARE_PROVIDER_SITE_OTHER): Payer: Medicare HMO | Admitting: Emergency Medicine

## 2020-07-19 DIAGNOSIS — I255 Ischemic cardiomyopathy: Secondary | ICD-10-CM

## 2020-07-19 LAB — CUP PACEART INCLINIC DEVICE CHECK
Battery Remaining Longevity: 137 mo
Battery Voltage: 3.17 V
Brady Statistic RV Percent Paced: 0.01 %
Date Time Interrogation Session: 20211214101836
HighPow Impedance: 33 Ohm
HighPow Impedance: 42 Ohm
Implantable Lead Implant Date: 20130603
Implantable Lead Location: 753860
Implantable Lead Model: 6947
Implantable Pulse Generator Implant Date: 20211129
Lead Channel Impedance Value: 285 Ohm
Lead Channel Impedance Value: 342 Ohm
Lead Channel Pacing Threshold Amplitude: 1 V
Lead Channel Pacing Threshold Pulse Width: 0.4 ms
Lead Channel Sensing Intrinsic Amplitude: 9 mV
Lead Channel Setting Pacing Amplitude: 2 V
Lead Channel Setting Pacing Pulse Width: 0.4 ms
Lead Channel Setting Sensing Sensitivity: 0.3 mV

## 2020-07-19 NOTE — Progress Notes (Signed)
Wound check appointment. Steri-strips removed. Wound without redness or edema. Incision edges approximated, wound well healed. Normal device function. Thresholds, sensing, and impedances consistent with implant measurements. Device programmed at chronic settings due to mature lead. Histogram distribution appropriate for patient and level of activity. 1 ventricular arrhythmia noted with EGM that shows NSVT of 6 beats.. Patient educated about wound care, arm mobility, lifting restrictions, shock plan. ROV with Dr Caryl Comes 10/10/20. Enrolled in remote follow-up and next remote 10/03/20.

## 2020-08-03 ENCOUNTER — Encounter (INDEPENDENT_AMBULATORY_CARE_PROVIDER_SITE_OTHER): Payer: Self-pay

## 2020-08-03 ENCOUNTER — Encounter (INDEPENDENT_AMBULATORY_CARE_PROVIDER_SITE_OTHER): Payer: 59 | Admitting: Ophthalmology

## 2020-08-17 ENCOUNTER — Telehealth: Payer: Self-pay | Admitting: Family Medicine

## 2020-08-17 NOTE — Telephone Encounter (Signed)
Pt is calling in stating that he has new insurance and would like to see if someone can call [employer] [phone]  Pt is needing refills for his (test strips) OneTouch Ultra stating the the insurance company is need a PA stating that he can get it and so the cost will not be so high for him.  Pt would like to have a call back when it is sent in to the pharmacy.

## 2020-08-18 MED ORDER — ACCU-CHEK GUIDE W/DEVICE KIT: PACK | 0 refills | Status: DC

## 2020-08-18 MED ORDER — ACCU-CHEK GUIDE VI STRP: ORAL_STRIP | 12 refills | Status: DC

## 2020-08-18 MED ORDER — ACCU-CHEK SOFTCLIX LANCETS MISC: 3 refills | Status: DC

## 2020-08-18 NOTE — Telephone Encounter (Signed)
New rx for approved device and supplies sent to pharmacy.  Patient informed.

## 2020-08-19 ENCOUNTER — Telehealth (HOSPITAL_COMMUNITY): Payer: Self-pay | Admitting: Pharmacy Technician

## 2020-08-19 DIAGNOSIS — L0291 Cutaneous abscess, unspecified: Secondary | ICD-10-CM | POA: Diagnosis not present

## 2020-08-19 MED ORDER — EMPAGLIFLOZIN 10 MG PO TABS: 10.0000 mg | ORAL_TABLET | Freq: Every day | ORAL | 6 refills | Status: DC

## 2020-08-19 NOTE — Addendum Note (Signed)
Addended by: Scarlette Calico on: 08/19/2020 03:30 PM   Modules accepted: Orders

## 2020-08-19 NOTE — Telephone Encounter (Signed)
rx for jardiance sent to pharmacy

## 2020-08-19 NOTE — Telephone Encounter (Signed)
I received a tier exception for the patient's Farxiga. His current 30 day co-pay is, $95. A 30 day Jardiance co-pay is $45.   Called patient to talk about possibly changing him to [place]. Had to leave a message for him to call back.  Charlann Boxer, CPhT

## 2020-08-19 NOTE — Telephone Encounter (Signed)
Spoke with the patient. He is fine with switching from [place] to [place]. Asked Heather, RN to send the new RX to his pharmacy.  Charlann Boxer, CPhT

## 2020-08-23 ENCOUNTER — Encounter (INDEPENDENT_AMBULATORY_CARE_PROVIDER_SITE_OTHER): Payer: 59 | Admitting: Ophthalmology

## 2020-08-29 ENCOUNTER — Emergency Department (HOSPITAL_COMMUNITY): Payer: Medicare HMO

## 2020-08-29 ENCOUNTER — Emergency Department (HOSPITAL_COMMUNITY)
Admission: EM | Admit: 2020-08-29 | Discharge: 2020-08-29 | Disposition: A | Discharge status: Home / Self Care | Payer: Medicare HMO | Attending: Emergency Medicine | Admitting: Emergency Medicine

## 2020-08-29 ENCOUNTER — Encounter (HOSPITAL_COMMUNITY): Payer: Self-pay | Admitting: Emergency Medicine

## 2020-08-29 ENCOUNTER — Other Ambulatory Visit: Payer: Self-pay

## 2020-08-29 DIAGNOSIS — U071 COVID-19: Secondary | ICD-10-CM | POA: Diagnosis not present

## 2020-08-29 DIAGNOSIS — E11319 Type 2 diabetes mellitus with unspecified diabetic retinopathy without macular edema: Secondary | ICD-10-CM | POA: Diagnosis not present

## 2020-08-29 DIAGNOSIS — Z7984 Long term (current) use of oral hypoglycemic drugs: Secondary | ICD-10-CM | POA: Insufficient documentation

## 2020-08-29 DIAGNOSIS — Z7901 Long term (current) use of anticoagulants: Secondary | ICD-10-CM | POA: Diagnosis not present

## 2020-08-29 DIAGNOSIS — Z951 Presence of aortocoronary bypass graft: Secondary | ICD-10-CM | POA: Insufficient documentation

## 2020-08-29 DIAGNOSIS — I252 Old myocardial infarction: Secondary | ICD-10-CM | POA: Insufficient documentation

## 2020-08-29 DIAGNOSIS — N183 Chronic kidney disease, stage 3 unspecified: Secondary | ICD-10-CM | POA: Insufficient documentation

## 2020-08-29 DIAGNOSIS — I5042 Chronic combined systolic (congestive) and diastolic (congestive) heart failure: Secondary | ICD-10-CM | POA: Insufficient documentation

## 2020-08-29 DIAGNOSIS — I509 Heart failure, unspecified: Secondary | ICD-10-CM | POA: Diagnosis not present

## 2020-08-29 DIAGNOSIS — Z79899 Other long term (current) drug therapy: Secondary | ICD-10-CM | POA: Diagnosis not present

## 2020-08-29 DIAGNOSIS — R1110 Vomiting, unspecified: Secondary | ICD-10-CM

## 2020-08-29 DIAGNOSIS — Z794 Long term (current) use of insulin: Secondary | ICD-10-CM | POA: Diagnosis not present

## 2020-08-29 DIAGNOSIS — E860 Dehydration: Secondary | ICD-10-CM

## 2020-08-29 DIAGNOSIS — I130 Hypertensive heart and chronic kidney disease with heart failure and stage 1 through stage 4 chronic kidney disease, or unspecified chronic kidney disease: Secondary | ICD-10-CM | POA: Diagnosis not present

## 2020-08-29 DIAGNOSIS — Z95810 Presence of automatic (implantable) cardiac defibrillator: Secondary | ICD-10-CM | POA: Diagnosis not present

## 2020-08-29 DIAGNOSIS — R112 Nausea with vomiting, unspecified: Secondary | ICD-10-CM | POA: Diagnosis not present

## 2020-08-29 DIAGNOSIS — I2510 Atherosclerotic heart disease of native coronary artery without angina pectoris: Secondary | ICD-10-CM | POA: Insufficient documentation

## 2020-08-29 DIAGNOSIS — E1140 Type 2 diabetes mellitus with diabetic neuropathy, unspecified: Secondary | ICD-10-CM | POA: Diagnosis not present

## 2020-08-29 DIAGNOSIS — E1122 Type 2 diabetes mellitus with diabetic chronic kidney disease: Secondary | ICD-10-CM | POA: Diagnosis not present

## 2020-08-29 DIAGNOSIS — R42 Dizziness and giddiness: Secondary | ICD-10-CM | POA: Diagnosis not present

## 2020-08-29 DIAGNOSIS — I517 Cardiomegaly: Secondary | ICD-10-CM | POA: Diagnosis not present

## 2020-08-29 DIAGNOSIS — R079 Chest pain, unspecified: Secondary | ICD-10-CM | POA: Diagnosis not present

## 2020-08-29 LAB — HEPATIC FUNCTION PANEL
ALT: 24 U/L (ref 0–44)
AST: 25 U/L (ref 15–41)
Albumin: 3 g/dL — ABNORMAL LOW (ref 3.5–5.0)
Alkaline Phosphatase: 181 U/L — ABNORMAL HIGH (ref 38–126)
Bilirubin, Direct: 0.8 mg/dL — ABNORMAL HIGH (ref 0.0–0.2)
Indirect Bilirubin: 1.9 mg/dL — ABNORMAL HIGH (ref 0.3–0.9)
Total Bilirubin: 2.7 mg/dL — ABNORMAL HIGH (ref 0.3–1.2)
Total Protein: 7.4 g/dL (ref 6.5–8.1)

## 2020-08-29 LAB — BASIC METABOLIC PANEL
Anion gap: 14 (ref 5–15)
BUN: 30 mg/dL — ABNORMAL HIGH (ref 6–20)
CO2: 20 mmol/L — ABNORMAL LOW (ref 22–32)
Calcium: 8.9 mg/dL (ref 8.9–10.3)
Chloride: 102 mmol/L (ref 98–111)
Creatinine, Ser: 2.33 mg/dL — ABNORMAL HIGH (ref 0.61–1.24)
GFR, Estimated: 31 mL/min — ABNORMAL LOW (ref >60)
Glucose, Bld: 247 mg/dL — ABNORMAL HIGH (ref 70–99)
Potassium: 4.4 mmol/L (ref 3.5–5.1)
Sodium: 136 mmol/L (ref 135–145)

## 2020-08-29 LAB — CBC
HCT: 37.5 % — ABNORMAL LOW (ref 39.0–52.0)
Hemoglobin: 11.7 g/dL — ABNORMAL LOW (ref 13.0–17.0)
MCH: 25.1 pg — ABNORMAL LOW (ref 26.0–34.0)
MCHC: 31.2 g/dL (ref 30.0–36.0)
MCV: 80.5 fL (ref 80.0–100.0)
Platelets: 220 10*3/uL (ref 150–400)
RBC: 4.66 MIL/uL (ref 4.22–5.81)
RDW: 19.9 % — ABNORMAL HIGH (ref 11.5–15.5)
WBC: 11.1 10*3/uL — ABNORMAL HIGH (ref 4.0–10.5)
nRBC: 0.3 % — ABNORMAL HIGH (ref 0.0–0.2)

## 2020-08-29 LAB — TROPONIN I (HIGH SENSITIVITY)
Troponin I (High Sensitivity): 72 ng/L — ABNORMAL HIGH (ref <18)
Troponin I (High Sensitivity): 76 ng/L — ABNORMAL HIGH (ref <18)

## 2020-08-29 LAB — SARS CORONAVIRUS 2 (TAT 6-24 HRS): SARS Coronavirus 2: POSITIVE — AB

## 2020-08-29 LAB — LIPASE, BLOOD: Lipase: 21 U/L (ref 11–51)

## 2020-08-29 LAB — CBG MONITORING, ED: Glucose-Capillary: 208 mg/dL — ABNORMAL HIGH (ref 70–99)

## 2020-08-29 MED ORDER — ONDANSETRON 4 MG PO TBDP: 4.0000 mg | ORAL_TABLET | Freq: Three times a day (TID) | ORAL | 0 refills | Status: DC | PRN

## 2020-08-29 MED ORDER — ONDANSETRON HCL 4 MG/2ML IJ SOLN
4.0000 mg | Freq: Once | INTRAMUSCULAR | Status: AC
  Administered 2020-08-29: 4 mg via INTRAVENOUS
  Filled 2020-08-29: qty 2

## 2020-08-29 MED ORDER — SODIUM CHLORIDE 0.9 % IV BOLUS
500.0000 mL | Freq: Once | INTRAVENOUS | Status: AC
  Administered 2020-08-29: 500 mL via INTRAVENOUS

## 2020-08-29 NOTE — ED Notes (Signed)
Patient transported to X-ray 

## 2020-08-29 NOTE — ED Provider Notes (Signed)
Pt signed out by Dr. Tamera Punt.  He is able to drink fluids and take a few bites of the sandwich.  He denies any pain, he just said he does not have an appetite.  He is not nauseous any more.  Pt's 2nd troponin is stable.  He is stable for d/c.  He is to return if worse.  F/u with pcp.    Isla Pence, MD 08/29/20 [phone]

## 2020-08-29 NOTE — ED Triage Notes (Signed)
Pt here from home with c/o chest pain over the last 4 days , pt states that he has has some nausea and has not been able to take some of his meds also
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
## 2020-09-02 ENCOUNTER — Telehealth: Payer: Self-pay | Admitting: Family Medicine

## 2020-09-02 NOTE — Telephone Encounter (Signed)
Hi Sondra Barges can you take a look at this for me

## 2020-09-02 NOTE — Telephone Encounter (Signed)
Cover my MEDS  They need PA FOR insulin aspart (NOVOLOG FLEXPEN) 100 UNIT/ML FlexPen  Reference # [id number]  Call [phone]

## 2020-09-03 ENCOUNTER — Other Ambulatory Visit: Payer: Self-pay | Admitting: Internal Medicine

## 2020-09-05 ENCOUNTER — Other Ambulatory Visit: Payer: Self-pay | Admitting: Family Medicine

## 2020-09-05 ENCOUNTER — Other Ambulatory Visit: Payer: Self-pay

## 2020-09-05 DIAGNOSIS — Z794 Long term (current) use of insulin: Secondary | ICD-10-CM

## 2020-09-05 DIAGNOSIS — E1122 Type 2 diabetes mellitus with diabetic chronic kidney disease: Secondary | ICD-10-CM

## 2020-09-05 DIAGNOSIS — N183 Chronic kidney disease, stage 3 unspecified: Secondary | ICD-10-CM

## 2020-09-05 MED ORDER — NOVOLOG FLEXPEN 100 UNIT/ML ~~LOC~~ SOPN: PEN_INJECTOR | SUBCUTANEOUS | 6 refills | Status: DC

## 2020-09-05 NOTE — Telephone Encounter (Signed)
Prior authorization had been completed but denied.  Called [employer] prior authorization was for tier exemption but new prescription has to be sent into pharmacy.   Contacted [facility] [place] in past,  patient had no copay for this medication.  Once new prescription is received, will notify office if new prior authorization should be started

## 2020-09-06 ENCOUNTER — Ambulatory Visit (HOSPITAL_COMMUNITY)
Admission: RE | Admit: 2020-09-06 | Discharge: 2020-09-06 | Disposition: A | Discharge status: Home / Self Care | Payer: Medicare HMO | Source: Ambulatory Visit | Attending: Cardiology | Admitting: Cardiology

## 2020-09-06 ENCOUNTER — Encounter (HOSPITAL_COMMUNITY): Payer: Self-pay

## 2020-09-06 ENCOUNTER — Other Ambulatory Visit: Payer: Self-pay

## 2020-09-06 ENCOUNTER — Telehealth (HOSPITAL_COMMUNITY): Payer: Self-pay

## 2020-09-06 ENCOUNTER — Encounter (HOSPITAL_COMMUNITY): Payer: Self-pay | Admitting: Cardiology

## 2020-09-06 DIAGNOSIS — I5022 Chronic systolic (congestive) heart failure: Secondary | ICD-10-CM | POA: Diagnosis not present

## 2020-09-06 HISTORY — DX: COVID-19: U07.1

## 2020-09-06 NOTE — Progress Notes (Signed)
Heart Failure TeleHealth Note  Due to national recommendations of social distancing due to [place] 19, Audio/video telehealth visit is felt to be most appropriate for this patient at this time.  See MyChart message from today for patient consent regarding telehealth for [facility].  Date:  09/06/2020   ID:  Marcus Johnson., DOB 1961/05/14, MRN [id number]  Location: Home  Provider location: [place] [facility] Type of Visit: Established patient  PCP:  [name], MD  Cardiologist:  Dr. Aundra Dubin   History of Present Illness: Marcus Johnson. is a 60 y.o. male who presents via audio/video conferencing for a telehealth visit today, visit was not done in person as the patient has tested positive for COVID-19.     Patient has a h/o DM2, CAD s/p CABG 5726, systolic HF due to ischemic cardiomyopathy with EF 20-25% (echo 12/15), DM2 and CKD. He is s/p Medtronic single chamber ICD.  Admitted in 12/15 due to ADHF. Required short course milrinone for diuresis. Diuresed 30 pounds.   CPX 2/16 showed severely reduced functional capacity.  There was a significant disconnect between his symptoms and the CPX.  RHC in 6/16 showed fairly normal filling pressures and low but not markedly low cardiac output.   CPX (4/19) was submaximal but suggested severe limitation from HF.   He was admitted in 6/19 with marked volume overload and dyspnea.  Echo in 6/19 showed EF 20% with severe global hypokinesis.  LHC/RHC showed severe native vessel CAD with patent LIMA-D, SVG-OM, and SVG-PLV; cardiac index low.  He was started on milrinone and diuresed.  We discussed LVAD extensively in light of low output, creatinine that is trending up and concern for decompensation of RV over time. He was adamant that he did not want to pursue LVAD workup yet and did not want a referral for transplant evaluation. Milrinone was weaned off and he was discharged home.   Admitted 08/01/18-08/07/18 with  volume overload and left foot wound. Diuresed 38 lbs with IV lasix and then transitioned back to torsemide 100 mg BID. ID was consulted with concerns for left foot osteomyelitis noted on CT scan. He got IV ancef q8 hours x 42 days. [facility]HC provided teaching and supplies for home antibiotics. Course complicated by AKI. DC weight 200 lbs.   He went into atrial flutter and had DCCV to NSR in 1/20.   Echo in 4/21 showed EF 25% with apical akinesis and diffuse hypokinesis relatively preserved in lateral wall, mildly decreased RV systolic function, PASP 41 mmHg. CPX in 5/21 showed severe functional limitation suggestive of advanced HF. 5/21 Zio patch showed short atrial flutter runs, 3.8% PVCs.   He has developed COVID-19 infection.  Symptoms have been predominantly GI. For about 3 wks, he has had significant nausea and vomiting.  He has been unable to take pills and has not been taking his cardiac medications. He has Zofran, but this is not working well.  He vomits any time he tries to eat or drink though he has held down some Pedialyte.  He is lightheaded with standing but has not passed out.  No chest pain.  He is very weak, feels like he can barely get out of bed. He went to the ER about a week ago, he was sent home as his creatinine was only mildly elevated from baseline and electrolytes otherwise were ok.     Studies: Echo (12/15): EF 20-25% RV moderately HK CPX (2/16): RER 1.10, peak VO2 8.6,  VE/VCO2 slope 43.7 => severely reduced functional capacity.  RHC (6/16): mean RA 8, PA 48/19 mean 30, mean PCWP 14, CI 2.03 Fick/2.28 Thermo, PVR 3.6.  Echo (5/17): EF 25%, mild LV dilation, moderate diastolic dysfunction, normal RV size with mild to moderate systolic dysfunction, mild mitral regurgitation CPX (4/19): Peak VO2 7, VE/VCO2 slope 41, RER 1.02 => submaximal but still suggestive of severe HF limitation.  Echo (6/19): EF 20%, diffuse severe HK, moderately decreased RV systolic function, moderate TR.   LHC/RHC (6/19): Severe native vessel CAD with patent LIMA-D, SVG-OM, and SVG-PLV; mean RA 15, PA 54/24 mean 37, mean PCWP 32, CI 1.86 Fick/1.64 thermo. PAPi 2, PVR 1.25 WU.  DC-CV 09/03/18 -->NSR  Echo (4/21): EF 25% with apical akinesis and diffuse hypokinesis relatively preserved in lateral wall, mildly decreased RV systolic function, PASP 41 mmHg. CPX (5/21): RER 1.1, VE/VCO2 slope 34, peak VO2 10.1 => severe functional limitation suggestive of severe HF limitation.  Zio (5/21): Zio patch showed short atrial flutter runs, 3.8% PVCs.   Labs:  8/15 LDL 88 12/15 K+ 4.2 creatinine 1.09  1/16 K 4.3, creatinine 1.34 4/16 K 4.5, creatinine 1.19 3/17 K 3.8, creatinine 1.22, HCT 35.3, BNP 1597 5/17 K 4.1, creatinine 1.25 6/18 LDL 138, K 4.4, creatinine 1.5 6/19 hgb 13.5, K 4.9, creatinine 1.53 => 1.5 => 1.4, BNP 992 08/07/2018: K 4.3 Creatinine 1.86  6/20: LDL 98, TSH normal, Hgb 13.5, K 4.3, creatinine 2.05 7/20: K 4.2, creatinine 2.56 => 2.03 8/20: LDL 34, HDL 22 3/21: K 4.1, creatinine 1.92 4/21: K 4, creatinine 1.96 6/21: K 5.2, creatinine 1.76 9/21: K 4.1, creatinine 1.74, hgb 14.1, digoxin < 0.2 11/21: LDL 83 1/22: COVID-19+, K 4.4, creatinine 2.33  Review of systems complete and found to be negative unless listed in HPI.   SH:  Social History   Socioeconomic History  . Marital status: Divorced    Spouse name: Not on file  . Number of children: Not on file  . Years of education: Not on file  . Highest education level: Not on file  Occupational History  . Not on file  Tobacco Use  . Smoking status: Never Smoker  . Smokeless tobacco: Never Used  Vaping Use  . Vaping Use: Never used  Substance and Sexual Activity  . Alcohol use: No    Alcohol/week: 0.0 standard drinks  . Drug use: No  . Sexual activity: Not on file  Other Topics Concern  . Not on file  Social History Narrative   Lives in [place] with his son and his family.  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   Social Determinants of Health   Financial Resource Strain: Not on file  Food Insecurity: Not on file  Transportation Needs: Not on file  Physical Activity: Not on file  Stress: Not on file  Social Connections: Not on file  Intimate Partner Violence: Not on file    FH:  Family History  Problem Relation Age of Onset  . Diabetes Father        died in his 67's  . Hypertension Mother        died in her 49's - had a ppm.  . Amblyopia Neg Hx   . Blindness Neg Hx   . Cataracts Neg Hx   . Glaucoma Neg Hx   . Macular degeneration Neg Hx   . Retinal detachment Neg Hx   . Strabismus Neg Hx   . Retinitis  pigmentosa Neg Hx     Past Medical History:  Diagnosis Date  . AICD (automatic cardioverter/defibrillator) present    Single-chamber  implantable cardiac defibrillator - Medtronic  . Cataract    Mixed form OU  . CHF (congestive heart failure) ([place])   . Chronic systolic heart failure ([place])    a. Echo 5/13: Mild LVE, mild LVH, EF 10%, anteroseptal, lateral, apical AK, mild MR, mild LAE, moderately reduced RVSF, mild RAE, PASP 60;  b. 07/2014 Echo: EF 20-2%, diff HK, AKI of antsep/apical/mid-apicalinferior, mod reduced RV.  Marland Kitchen Coronary artery disease    a. s/p CABG 2002 b. LHC 5/13:  dLM 80%, LAD subtotally occluded, pCFX occluded, pRCA 50%, mid? Occlusion with high take off of the PDA with 70% multiple lesions-not bypassed and supplies collaterals to LAD, LIMA-IM/ramus ok, S-OM ok, S-PLA branches ok. Medical therapy was recommended  . COVID   . Diabetic retinopathy (HCC)    NPDR OD, PDR OS  . Gout    "on daily RX" (01/08/2018)  . Hypertension   . Hypertensive retinopathy    OU  . Ischemic cardiomyopathy    a. Echo 5/13: Mild LVE, mild LVH, EF 10%, anteroseptal, lateral, apical AK, mild MR, mild LAE, moderately reduced RVSF, mild RAE, PASP 60;  b. 01/2012 s/p MDT [id number] Protecta XT VR AICD;  c. 07/2014 Echo: EF 20-2%, diff HK, AKI of  antsep/apical/mid-apicalinferior, mod reduced RV.  Marland Kitchen MRSA (methicillin resistant Staphylococcus aureus)    Status post right foot plantar deep infection with MRSA status post  I&D 10/2008  . Myocardial infarction [facility])    "was told I'd had several before heart OR in 2002" (01/08/2018)  . Noncompliance   . Peripheral neuropathy   . Syncope   . Type II diabetes mellitus ([place])    requiring insulin   . Vitreous hemorrhage, left (HCC)    and proliferative diabetic retinopathy    Current Outpatient Medications  Medication Sig Dispense Refill  . Accu-Chek Softclix Lancets lancets Use 1-4 times daily as needed. DX E11.22 360 each 3  . blood glucose meter kit and supplies KIT Use as directed. 1 each 0  . Blood Glucose Monitoring Suppl (ACCU-CHEK GUIDE) w/Device KIT Use 1-4 times daily as needed. DX E11.22 1 kit 0  . allopurinol (ZYLOPRIM) 300 MG tablet Take 600 mg by mouth daily as needed (gout).    . carvedilol (COREG) 12.5 MG tablet TAKE 1 TABLET (12.5 MG TOTAL) BY MOUTH 2 (TWO) TIMES DAILY. 30 tablet 11  . digoxin (LANOXIN) 0.125 MG tablet TAKE 1 TABLET BY MOUTH EVERY DAY (Patient taking differently: Take 0.125 mg by mouth daily.) 90 tablet 3  . ELIQUIS 5 MG TABS tablet TAKE 1 TABLET BY MOUTH TWICE A DAY (Patient taking differently: Take 5 mg by mouth 2 (two) times daily.) 60 tablet 6  . empagliflozin (JARDIANCE) 10 MG TABS tablet Take 1 tablet (10 mg total) by mouth daily before breakfast. 30 tablet 6  . glucose blood (ACCU-CHEK GUIDE) test strip Use 1-4 times daily as needed. DX E11.22 100 each 12  . insulin aspart (NOVOLOG FLEXPEN) 100 UNIT/ML FlexPen INJECT 5 UNITS INTO THE SKIN 3 TIMES DAILY WITH MEALS 15 mL 6  . insulin glargine (LANTUS SOLOSTAR) 100 UNIT/ML Solostar Pen INJECT 50 UNITS INTO THE SKIN AT BEDTIME. (Patient taking differently: Inject 50 Units into the skin at bedtime.) 15 mL 11  . Insulin Pen Needle (BD PEN NEEDLE NANO U/F) 32G X 4 MM MISC USE 4 TIMES A  DAY 100 each 11  .  ketoconazole (NIZORAL) 2 % cream Apply 1 application topically 2 (two) times daily as needed for irritation. 30 g 5  . KLOR-CON M20 20 MEQ tablet TAKE 1 TABLET (20 MEQ TOTAL) BY MOUTH DAILY. TAKE 1 TABLET EXTRA ON DAYS YOU TAKE METOLAZONE. (Patient taking differently: Take 20 mEq by mouth See admin instructions. Take 20 mEq by mouth daily Monday - Friday and take 40 mEq on Saturday) 90 tablet 3  . metolazone (ZAROXOLYN) 2.5 MG tablet Take 1 tablet (2.5 mg total) by mouth once a week. On Saturdays (Patient taking differently: Take 2.5 mg by mouth every Saturday.) 5 tablet 3  . ondansetron (ZOFRAN ODT) 4 MG disintegrating tablet Take 1 tablet (4 mg total) by mouth every 8 (eight) hours as needed for nausea or vomiting. 20 tablet 0  . rosuvastatin (CRESTOR) 40 MG tablet TAKE 1 TABLET BY MOUTH EVERY DAY (Patient taking differently: Take 40 mg by mouth daily.) 90 tablet 3  . sacubitril-valsartan (ENTRESTO) 97-103 MG Take 1 tablet by mouth 2 (two) times daily. 60 tablet 6  . spironolactone (ALDACTONE) 25 MG tablet TAKE 1 TABLET BY MOUTH EVERY DAY (Patient taking differently: Take 25 mg by mouth daily.) 90 tablet 0  . torsemide (DEMADEX) 20 MG tablet Take 4 tablets (80 mg total) by mouth in the morning AND 2 tablets (40 mg total) daily after supper. (Patient taking differently: Take 80 mg  by mouth in the morning AND 40 mg daily after supper.) 180 tablet 3   No current facility-administered medications for this encounter.    Vitals:   09/06/20 0859  Weight: 92.5 kg (204 lb)   Wt Readings from Last 3 Encounters:  09/06/20 92.5 kg (204 lb)  07/04/20 92.1 kg (203 lb)  06/23/20 93.3 kg (205 lb 9.6 oz)    PHYSICAL EXAM: (Video/Tele Health Call; Exam is subjective and or/visual.) General:  Speaks in full sentences. No resp difficulty. Lungs: Normal respiratory effort with conversation.  Abdomen: Non-distended per patient report Extremities: Pt denies edema. Neuro: Alert & oriented x 3.   ASSESSMENT  & PLAN: 1. COVID-19 infection: Patient has had nausea/vomiting x 3 weeks and has not been able to take his meds.  He is profoundly weak and can barely get out of bed per his report. He has lightheadedness with standing but no syncope.  I am concerned that he is markedly dehydrated, creatinine was already rising when he was seen in the ER a week ago.  I strongly recommended that he go to the ER for evaluation, safest to call EMS.  However, he had to wait a long time in the ER a week ago and does not want to repeat this.  I am not sure that he will go in.  2.Chronic systolic HF:ICM.HasMedtronic ICD. Narrow QRS, not CRT candidate. Has required milrinone multiple times in the past for low output HF. Offered advanced therapies in past but declined. Echo 6/19 with EF 20% and moderately decreased RV systolic function.CPX 4/19 with severe functional limitation due to [facility] 6/19 showed low output and coronary angiography showed no interventional targets.Echo 08/04/18 with EF 20-25%, mild LVH, diffuse HK, akinesis of anteroseptal and anterior myocardium, grade 2 DD, trivial AI, LA mod dilated, RV moderately dilated, RA mildly dilated, moderate TR, PA peak pressure 49 mm Hg.Echo in 4/21 showed EF 25%, mildly decreased RV systolic function.  CPX in 5/21 showed severe HF limitation.  He has been very resistant to LVAD/transplant evaluation, both offered  with multiple discussions in past.  I worry that we will miss the window for advanced therapies. He was not interested in baroreflex activation therapy.  QRS not wide, not CRT candidate.  History of cardiorenal syndrome/diabetic nephropathy, creatinine up to 2.33 when in the ER a week ago, likely due to dehydration with nausea/vomiting.   He is profoundly weak today likely due to dehydration from nausea/vomiting.  He is unable to keep his meds down.  - Hold torsemide/metolazone while having nausea/vomiting.   - He is not taking his other cardiac meds as he is  unable to hold them down.  I think it is safest for him to come to the ER to get electrolytes/creatinine checked and potentially be admitted for dehydration (profoundly weak and orthostatic).  2. CAD s/p CABG2002:Last cath in 6/19 with patent grafts. No chest pain.  - No ASA given apixaban use.  - Continue Crestor 40 mg daily.  3. CKD Stage 3:  I suspect that this is a combination of cardiorenal syndrome and diabetic nephropathy.  - I have recommended nephrology referral but he does not want this yet due to finances.   4. DM: History of very poor control but hgbA1c now trending down.  Followup with PCP.  5. Atrial flutter: s/p DC-CV 08/21/18.  He has been in NSR recently.  - Continue Eliquis (currently not able to hold it down).    COVID screen The patient has COVID-19 infection.   Patient Risk: After full review of this patients clinical status, I feel that they are at moderate risk for cardiac decompensation at this time.  Relevant cardiac medications were reviewed at length with the patient today. The patient does not have concerns regarding their medications at this time.   Recommended follow-up:  I will need to see him in the office once his COVID-19 quarantine is over.   Today, I have spent 20 minutes with the patient with telehealth technology discussing the above issues .    Signed, Loralie Champagne, MD  09/06/2020  [facility] [address] [facility] and [place] [address] [phone] (office) [phone] (fax)

## 2020-09-06 NOTE — Patient Instructions (Signed)
No medication changes were made. Please continue all current medications as prescribed.  Your physician recommends that you schedule a follow-up appointment when you are done with Quarantine.  You have been referred to paramedicine program. Our Social work team will be in contact with you to set up your first home visit.    If you have any questions or concerns before your next appointment please send [place] a message through [place] or call our office at [phone].    TO LEAVE A MESSAGE FOR THE NURSE SELECT OPTION 2, PLEASE LEAVE A MESSAGE INCLUDING: . YOUR NAME . DATE OF BIRTH . CALL BACK NUMBER . REASON FOR CALL**this is important as we prioritize the call backs  YOU WILL RECEIVE A CALL BACK THE SAME DAY AS LONG AS YOU CALL BEFORE 4:00 PM   Do the following things EVERYDAY: 1) Weigh yourself in the morning before breakfast. Write it down and keep it in a log. 2) Take your medicines as prescribed 3) Eat low salt foods--Limit salt (sodium) to 2000 mg per day.  4) Stay as active as you can everyday 5) Limit all fluids for the day to less than 2 liters   At the [facility], you and your health needs are our priority. As part of our continuing mission to provide you with exceptional heart care, we have created designated Provider Care Teams. These Care Teams include your primary Cardiologist (physician) and Advanced Practice Providers (APPs- Physician Assistants and Nurse Practitioners) who all work together to provide you with the care you need, when you need it.   You may see any of the following providers on your designated Care Team at your next follow up: Marland Kitchen Dr Glori Bickers . Dr Loralie Champagne . Darrick Grinder, NP . Lyda Jester, PA . Audry Riles, PharmD   Please be sure to bring in all your medications bottles to every appointment.

## 2020-09-06 NOTE — Telephone Encounter (Signed)
  Patient Consent for Virtual Visit         Marcus Johnson. has provided verbal consent on 09/06/2020 for a virtual visit (video or telephone).   CONSENT FOR VIRTUAL VISIT FOR:  Marcus Johnson.  By participating in this virtual visit I agree to the following:  I hereby voluntarily request, consent and authorize [facility] and its employed or contracted physicians, physician assistants, nurse practitioners or other licensed health care professionals (the Practitioner), to provide me with telemedicine health care services (the "Services") as deemed necessary by the treating Practitioner. I acknowledge and consent to receive the Services by the Practitioner via telemedicine. I understand that the telemedicine visit will involve communicating with the Practitioner through live audiovisual communication technology and the disclosure of certain medical information by electronic transmission. I acknowledge that I have been given the opportunity to request an in-person assessment or other available alternative prior to the telemedicine visit and am voluntarily participating in the telemedicine visit.  I understand that I have the right to withhold or withdraw my consent to the use of telemedicine in the course of my care at any time, without affecting my right to future care or treatment, and that the Practitioner or I may terminate the telemedicine visit at any time. I understand that I have the right to inspect all information obtained and/or recorded in the course of the telemedicine visit and may receive copies of available information for a reasonable fee.  I understand that some of the potential risks of receiving the Services via telemedicine include:  Marland Kitchen Delay or interruption in medical evaluation due to technological equipment failure or disruption; . Information transmitted may not be sufficient (e.g. poor resolution of images) to allow for appropriate medical decision making by  the Practitioner; and/or  . In rare instances, security protocols could fail, causing a breach of personal health information.  Furthermore, I acknowledge that it is my responsibility to provide information about my medical history, conditions and care that is complete and accurate to the best of my ability. I acknowledge that Practitioner's advice, recommendations, and/or decision may be based on factors not within their control, such as incomplete or inaccurate data provided by me or distortions of diagnostic images or specimens that may result from electronic transmissions. I understand that the practice of medicine is not an exact science and that Practitioner makes no warranties or guarantees regarding treatment outcomes. I acknowledge that a copy of this consent can be made available to me via my patient portal ([place]), or I can request a printed copy by calling the office of [facility].    I understand that my insurance will be billed for this visit.   I have read or had this consent read to me. . I understand the contents of this consent, which adequately explains the benefits and risks of the Services being provided via telemedicine.  . I have been provided ample opportunity to ask questions regarding this consent and the Services and have had my questions answered to my satisfaction. . I give my informed consent for the services to be provided through the use of telemedicine in my medical care

## 2020-09-07 ENCOUNTER — Telehealth (HOSPITAL_COMMUNITY): Payer: Self-pay | Admitting: Pharmacy Technician

## 2020-09-07 NOTE — Telephone Encounter (Signed)
Received several tier exceptions for the patient. Current Entresto 30 day co-pay is $45. Received a [place] tier exception, patient is on [place].   Called and left the patient a message to call me back, so we can discuss options.  Charlann Boxer, CPhT
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
## 2020-09-14 ENCOUNTER — Telehealth (HOSPITAL_COMMUNITY): Payer: Self-pay | Admitting: Licensed Clinical Social Worker

## 2020-09-14 NOTE — Telephone Encounter (Signed)
Pt stating that he still feels bad and reports feeling even worse than when he saw Dr. Aundra Dubin over virtual visit last week.  Reports continued nausea and inability to consistently keep down medications and food.  Has spoken to PCP and they prescribed zofran for nausea but not able to see in office due to [place].   CSW spoke with triage and the confirm pt would need to come to ED as Dr. Aundra Dubin instructed him to last week for further evaluation.  Pt not agreeable at this time due to long wait times- feels as if he can manage ok at home and has people around him who are caring for him and make sure he doesn't decline.  Will continue to follow and assist as needed  Jorge Ny, [facility] Desk#: [phone] Cell#: [phone]

## 2020-09-19 ENCOUNTER — Encounter (INDEPENDENT_AMBULATORY_CARE_PROVIDER_SITE_OTHER): Payer: Medicare HMO | Admitting: Ophthalmology

## 2020-09-19 DIAGNOSIS — H4312 Vitreous hemorrhage, left eye: Secondary | ICD-10-CM

## 2020-09-19 DIAGNOSIS — I10 Essential (primary) hypertension: Secondary | ICD-10-CM

## 2020-09-19 DIAGNOSIS — H35033 Hypertensive retinopathy, bilateral: Secondary | ICD-10-CM

## 2020-09-19 DIAGNOSIS — E113411 Type 2 diabetes mellitus with severe nonproliferative diabetic retinopathy with macular edema, right eye: Secondary | ICD-10-CM

## 2020-09-19 DIAGNOSIS — H25813 Combined forms of age-related cataract, bilateral: Secondary | ICD-10-CM

## 2020-09-19 DIAGNOSIS — E113512 Type 2 diabetes mellitus with proliferative diabetic retinopathy with macular edema, left eye: Secondary | ICD-10-CM

## 2020-09-19 DIAGNOSIS — E113311 Type 2 diabetes mellitus with moderate nonproliferative diabetic retinopathy with macular edema, right eye: Secondary | ICD-10-CM

## 2020-09-19 DIAGNOSIS — H3581 Retinal edema: Secondary | ICD-10-CM

## 2020-09-28 ENCOUNTER — Inpatient Hospital Stay: Payer: Self-pay

## 2020-09-28 ENCOUNTER — Other Ambulatory Visit (HOSPITAL_COMMUNITY): Payer: Medicare HMO

## 2020-09-28 ENCOUNTER — Ambulatory Visit (HOSPITAL_COMMUNITY)
Admission: RE | Admit: 2020-09-28 | Discharge: 2020-09-28 | Disposition: A | Discharge status: Home / Self Care | Payer: Medicare HMO | Source: Ambulatory Visit | Attending: Cardiology | Admitting: Cardiology

## 2020-09-28 ENCOUNTER — Telehealth (HOSPITAL_COMMUNITY): Payer: Self-pay | Admitting: Licensed Clinical Social Worker

## 2020-09-28 ENCOUNTER — Inpatient Hospital Stay (HOSPITAL_COMMUNITY): Payer: Medicare HMO

## 2020-09-28 ENCOUNTER — Inpatient Hospital Stay (HOSPITAL_COMMUNITY)
Admission: AD | Admit: 2020-09-28 | Discharge: 2020-11-14 | DRG: 001 | Disposition: A | Discharge status: Rehabilitation Facility | Payer: Medicare HMO | Attending: Surgery | Admitting: Surgery

## 2020-09-28 ENCOUNTER — Encounter (HOSPITAL_COMMUNITY): Payer: Self-pay | Admitting: Cardiology

## 2020-09-28 ENCOUNTER — Other Ambulatory Visit: Payer: Self-pay

## 2020-09-28 VITALS — BP 128/80 | HR 92 | Wt 221.2 lb

## 2020-09-28 DIAGNOSIS — Z452 Encounter for adjustment and management of vascular access device: Secondary | ICD-10-CM | POA: Diagnosis not present

## 2020-09-28 DIAGNOSIS — Z95811 Presence of heart assist device: Secondary | ICD-10-CM

## 2020-09-28 DIAGNOSIS — Z419 Encounter for procedure for purposes other than remedying health state, unspecified: Secondary | ICD-10-CM

## 2020-09-28 DIAGNOSIS — E871 Hypo-osmolality and hyponatremia: Secondary | ICD-10-CM | POA: Diagnosis not present

## 2020-09-28 DIAGNOSIS — J9811 Atelectasis: Secondary | ICD-10-CM | POA: Diagnosis not present

## 2020-09-28 DIAGNOSIS — N170 Acute kidney failure with tubular necrosis: Secondary | ICD-10-CM | POA: Diagnosis not present

## 2020-09-28 DIAGNOSIS — D72829 Elevated white blood cell count, unspecified: Secondary | ICD-10-CM

## 2020-09-28 DIAGNOSIS — Z951 Presence of aortocoronary bypass graft: Secondary | ICD-10-CM | POA: Diagnosis not present

## 2020-09-28 DIAGNOSIS — I700 Atherosclerosis of aorta: Secondary | ICD-10-CM | POA: Diagnosis not present

## 2020-09-28 DIAGNOSIS — Z01818 Encounter for other preprocedural examination: Secondary | ICD-10-CM

## 2020-09-28 DIAGNOSIS — H8110 Benign paroxysmal vertigo, unspecified ear: Secondary | ICD-10-CM | POA: Diagnosis not present

## 2020-09-28 DIAGNOSIS — R509 Fever, unspecified: Secondary | ICD-10-CM

## 2020-09-28 DIAGNOSIS — E1122 Type 2 diabetes mellitus with diabetic chronic kidney disease: Secondary | ICD-10-CM | POA: Diagnosis present

## 2020-09-28 DIAGNOSIS — N186 End stage renal disease: Secondary | ICD-10-CM | POA: Diagnosis not present

## 2020-09-28 DIAGNOSIS — R570 Cardiogenic shock: Secondary | ICD-10-CM | POA: Diagnosis not present

## 2020-09-28 DIAGNOSIS — L8990 Pressure ulcer of unspecified site, unspecified stage: Secondary | ICD-10-CM | POA: Insufficient documentation

## 2020-09-28 DIAGNOSIS — K083 Retained dental root: Secondary | ICD-10-CM | POA: Diagnosis present

## 2020-09-28 DIAGNOSIS — Z4682 Encounter for fitting and adjustment of non-vascular catheter: Secondary | ICD-10-CM | POA: Diagnosis not present

## 2020-09-28 DIAGNOSIS — R079 Chest pain, unspecified: Secondary | ICD-10-CM | POA: Diagnosis not present

## 2020-09-28 DIAGNOSIS — J811 Chronic pulmonary edema: Secondary | ICD-10-CM | POA: Diagnosis not present

## 2020-09-28 DIAGNOSIS — Z66 Do not resuscitate: Secondary | ICD-10-CM | POA: Diagnosis not present

## 2020-09-28 DIAGNOSIS — J939 Pneumothorax, unspecified: Secondary | ICD-10-CM | POA: Diagnosis not present

## 2020-09-28 DIAGNOSIS — E785 Hyperlipidemia, unspecified: Secondary | ICD-10-CM | POA: Diagnosis present

## 2020-09-28 DIAGNOSIS — I482 Chronic atrial fibrillation, unspecified: Secondary | ICD-10-CM | POA: Diagnosis not present

## 2020-09-28 DIAGNOSIS — I132 Hypertensive heart and chronic kidney disease with heart failure and with stage 5 chronic kidney disease, or end stage renal disease: Principal | ICD-10-CM | POA: Diagnosis present

## 2020-09-28 DIAGNOSIS — D638 Anemia in other chronic diseases classified elsewhere: Secondary | ICD-10-CM | POA: Diagnosis present

## 2020-09-28 DIAGNOSIS — I361 Nonrheumatic tricuspid (valve) insufficiency: Secondary | ICD-10-CM | POA: Diagnosis not present

## 2020-09-28 DIAGNOSIS — I4892 Unspecified atrial flutter: Secondary | ICD-10-CM | POA: Diagnosis not present

## 2020-09-28 DIAGNOSIS — N183 Chronic kidney disease, stage 3 unspecified: Secondary | ICD-10-CM | POA: Diagnosis not present

## 2020-09-28 DIAGNOSIS — I2510 Atherosclerotic heart disease of native coronary artery without angina pectoris: Secondary | ICD-10-CM | POA: Diagnosis present

## 2020-09-28 DIAGNOSIS — K0510 Chronic gingivitis, plaque induced: Secondary | ICD-10-CM | POA: Diagnosis present

## 2020-09-28 DIAGNOSIS — I252 Old myocardial infarction: Secondary | ICD-10-CM

## 2020-09-28 DIAGNOSIS — E1169 Type 2 diabetes mellitus with other specified complication: Secondary | ICD-10-CM | POA: Diagnosis not present

## 2020-09-28 DIAGNOSIS — I5189 Other ill-defined heart diseases: Secondary | ICD-10-CM | POA: Diagnosis not present

## 2020-09-28 DIAGNOSIS — I4891 Unspecified atrial fibrillation: Secondary | ICD-10-CM | POA: Diagnosis not present

## 2020-09-28 DIAGNOSIS — Z833 Family history of diabetes mellitus: Secondary | ICD-10-CM

## 2020-09-28 DIAGNOSIS — K045 Chronic apical periodontitis: Secondary | ICD-10-CM | POA: Diagnosis not present

## 2020-09-28 DIAGNOSIS — J189 Pneumonia, unspecified organism: Secondary | ICD-10-CM

## 2020-09-28 DIAGNOSIS — I5023 Acute on chronic systolic (congestive) heart failure: Secondary | ICD-10-CM

## 2020-09-28 DIAGNOSIS — Z7984 Long term (current) use of oral hypoglycemic drugs: Secondary | ICD-10-CM

## 2020-09-28 DIAGNOSIS — I272 Pulmonary hypertension, unspecified: Secondary | ICD-10-CM | POA: Diagnosis present

## 2020-09-28 DIAGNOSIS — H5500 Unspecified nystagmus: Secondary | ICD-10-CM | POA: Diagnosis not present

## 2020-09-28 DIAGNOSIS — E875 Hyperkalemia: Secondary | ICD-10-CM | POA: Diagnosis not present

## 2020-09-28 DIAGNOSIS — I493 Ventricular premature depolarization: Secondary | ICD-10-CM | POA: Diagnosis present

## 2020-09-28 DIAGNOSIS — Z01810 Encounter for preprocedural cardiovascular examination: Secondary | ICD-10-CM | POA: Diagnosis not present

## 2020-09-28 DIAGNOSIS — N1831 Chronic kidney disease, stage 3a: Secondary | ICD-10-CM | POA: Diagnosis not present

## 2020-09-28 DIAGNOSIS — E46 Unspecified protein-calorie malnutrition: Secondary | ICD-10-CM | POA: Diagnosis not present

## 2020-09-28 DIAGNOSIS — R5381 Other malaise: Secondary | ICD-10-CM | POA: Diagnosis not present

## 2020-09-28 DIAGNOSIS — K056 Periodontal disease, unspecified: Secondary | ICD-10-CM | POA: Diagnosis not present

## 2020-09-28 DIAGNOSIS — I514 Myocarditis, unspecified: Secondary | ICD-10-CM | POA: Diagnosis not present

## 2020-09-28 DIAGNOSIS — I472 Ventricular tachycardia: Secondary | ICD-10-CM | POA: Diagnosis present

## 2020-09-28 DIAGNOSIS — Z8249 Family history of ischemic heart disease and other diseases of the circulatory system: Secondary | ICD-10-CM

## 2020-09-28 DIAGNOSIS — Z4659 Encounter for fitting and adjustment of other gastrointestinal appliance and device: Secondary | ICD-10-CM

## 2020-09-28 DIAGNOSIS — F32A Depression, unspecified: Secondary | ICD-10-CM | POA: Diagnosis not present

## 2020-09-28 DIAGNOSIS — R918 Other nonspecific abnormal finding of lung field: Secondary | ICD-10-CM | POA: Diagnosis not present

## 2020-09-28 DIAGNOSIS — L89153 Pressure ulcer of sacral region, stage 3: Secondary | ICD-10-CM | POA: Diagnosis not present

## 2020-09-28 DIAGNOSIS — Z7189 Other specified counseling: Secondary | ICD-10-CM | POA: Diagnosis not present

## 2020-09-28 DIAGNOSIS — K08199 Complete loss of teeth due to other specified cause, unspecified class: Secondary | ICD-10-CM

## 2020-09-28 DIAGNOSIS — I5042 Chronic combined systolic (congestive) and diastolic (congestive) heart failure: Secondary | ICD-10-CM

## 2020-09-28 DIAGNOSIS — Z7901 Long term (current) use of anticoagulants: Secondary | ICD-10-CM

## 2020-09-28 DIAGNOSIS — R0789 Other chest pain: Secondary | ICD-10-CM | POA: Diagnosis not present

## 2020-09-28 DIAGNOSIS — Z8616 Personal history of COVID-19: Secondary | ICD-10-CM

## 2020-09-28 DIAGNOSIS — R059 Cough, unspecified: Secondary | ICD-10-CM

## 2020-09-28 DIAGNOSIS — S30820A Blister (nonthermal) of lower back and pelvis, initial encounter: Secondary | ICD-10-CM | POA: Diagnosis not present

## 2020-09-28 DIAGNOSIS — J90 Pleural effusion, not elsewhere classified: Secondary | ICD-10-CM | POA: Diagnosis present

## 2020-09-28 DIAGNOSIS — Z992 Dependence on renal dialysis: Secondary | ICD-10-CM | POA: Diagnosis not present

## 2020-09-28 DIAGNOSIS — X58XXXA Exposure to other specified factors, initial encounter: Secondary | ICD-10-CM | POA: Diagnosis present

## 2020-09-28 DIAGNOSIS — E1142 Type 2 diabetes mellitus with diabetic polyneuropathy: Secondary | ICD-10-CM | POA: Diagnosis present

## 2020-09-28 DIAGNOSIS — K029 Dental caries, unspecified: Secondary | ICD-10-CM | POA: Diagnosis not present

## 2020-09-28 DIAGNOSIS — I517 Cardiomegaly: Secondary | ICD-10-CM | POA: Diagnosis not present

## 2020-09-28 DIAGNOSIS — E1129 Type 2 diabetes mellitus with other diabetic kidney complication: Secondary | ICD-10-CM | POA: Diagnosis not present

## 2020-09-28 DIAGNOSIS — R0902 Hypoxemia: Secondary | ICD-10-CM | POA: Diagnosis not present

## 2020-09-28 DIAGNOSIS — I951 Orthostatic hypotension: Secondary | ICD-10-CM | POA: Diagnosis not present

## 2020-09-28 DIAGNOSIS — L89152 Pressure ulcer of sacral region, stage 2: Secondary | ICD-10-CM | POA: Diagnosis not present

## 2020-09-28 DIAGNOSIS — I255 Ischemic cardiomyopathy: Secondary | ICD-10-CM | POA: Diagnosis not present

## 2020-09-28 DIAGNOSIS — E861 Hypovolemia: Secondary | ICD-10-CM | POA: Diagnosis not present

## 2020-09-28 DIAGNOSIS — I081 Rheumatic disorders of both mitral and tricuspid valves: Secondary | ICD-10-CM | POA: Diagnosis not present

## 2020-09-28 DIAGNOSIS — E669 Obesity, unspecified: Secondary | ICD-10-CM | POA: Diagnosis not present

## 2020-09-28 DIAGNOSIS — Z515 Encounter for palliative care: Secondary | ICD-10-CM | POA: Diagnosis not present

## 2020-09-28 DIAGNOSIS — M79601 Pain in right arm: Secondary | ICD-10-CM | POA: Diagnosis not present

## 2020-09-28 DIAGNOSIS — I509 Heart failure, unspecified: Secondary | ICD-10-CM | POA: Diagnosis not present

## 2020-09-28 DIAGNOSIS — Z9889 Other specified postprocedural states: Secondary | ICD-10-CM

## 2020-09-28 DIAGNOSIS — I5043 Acute on chronic combined systolic (congestive) and diastolic (congestive) heart failure: Secondary | ICD-10-CM | POA: Diagnosis not present

## 2020-09-28 DIAGNOSIS — Z794 Long term (current) use of insulin: Secondary | ICD-10-CM

## 2020-09-28 DIAGNOSIS — N400 Enlarged prostate without lower urinary tract symptoms: Secondary | ICD-10-CM | POA: Diagnosis not present

## 2020-09-28 DIAGNOSIS — R531 Weakness: Secondary | ICD-10-CM | POA: Diagnosis not present

## 2020-09-28 DIAGNOSIS — Z4901 Encounter for fitting and adjustment of extracorporeal dialysis catheter: Secondary | ICD-10-CM | POA: Diagnosis not present

## 2020-09-28 DIAGNOSIS — D689 Coagulation defect, unspecified: Secondary | ICD-10-CM | POA: Diagnosis not present

## 2020-09-28 DIAGNOSIS — I5022 Chronic systolic (congestive) heart failure: Secondary | ICD-10-CM | POA: Diagnosis not present

## 2020-09-28 DIAGNOSIS — Z87892 Personal history of anaphylaxis: Secondary | ICD-10-CM

## 2020-09-28 DIAGNOSIS — E8809 Other disorders of plasma-protein metabolism, not elsewhere classified: Secondary | ICD-10-CM | POA: Diagnosis not present

## 2020-09-28 DIAGNOSIS — N179 Acute kidney failure, unspecified: Secondary | ICD-10-CM | POA: Diagnosis not present

## 2020-09-28 DIAGNOSIS — Z95828 Presence of other vascular implants and grafts: Secondary | ICD-10-CM

## 2020-09-28 DIAGNOSIS — N62 Hypertrophy of breast: Secondary | ICD-10-CM | POA: Diagnosis not present

## 2020-09-28 DIAGNOSIS — D649 Anemia, unspecified: Secondary | ICD-10-CM | POA: Diagnosis not present

## 2020-09-28 DIAGNOSIS — Z6831 Body mass index (BMI) 31.0-31.9, adult: Secondary | ICD-10-CM

## 2020-09-28 DIAGNOSIS — N990 Postprocedural (acute) (chronic) kidney failure: Secondary | ICD-10-CM | POA: Diagnosis not present

## 2020-09-28 DIAGNOSIS — Z95810 Presence of automatic (implantable) cardiac defibrillator: Secondary | ICD-10-CM | POA: Diagnosis not present

## 2020-09-28 DIAGNOSIS — N2889 Other specified disorders of kidney and ureter: Secondary | ICD-10-CM | POA: Diagnosis not present

## 2020-09-28 DIAGNOSIS — J810 Acute pulmonary edema: Secondary | ICD-10-CM | POA: Diagnosis not present

## 2020-09-28 DIAGNOSIS — I5084 End stage heart failure: Secondary | ICD-10-CM | POA: Diagnosis present

## 2020-09-28 DIAGNOSIS — Z888 Allergy status to other drugs, medicaments and biological substances status: Secondary | ICD-10-CM

## 2020-09-28 DIAGNOSIS — R0989 Other specified symptoms and signs involving the circulatory and respiratory systems: Secondary | ICD-10-CM | POA: Diagnosis not present

## 2020-09-28 DIAGNOSIS — I351 Nonrheumatic aortic (valve) insufficiency: Secondary | ICD-10-CM | POA: Diagnosis not present

## 2020-09-28 DIAGNOSIS — Z7982 Long term (current) use of aspirin: Secondary | ICD-10-CM

## 2020-09-28 DIAGNOSIS — I5020 Unspecified systolic (congestive) heart failure: Secondary | ICD-10-CM | POA: Diagnosis not present

## 2020-09-28 DIAGNOSIS — R42 Dizziness and giddiness: Secondary | ICD-10-CM | POA: Diagnosis not present

## 2020-09-28 DIAGNOSIS — I6782 Cerebral ischemia: Secondary | ICD-10-CM | POA: Diagnosis not present

## 2020-09-28 DIAGNOSIS — Z79899 Other long term (current) drug therapy: Secondary | ICD-10-CM

## 2020-09-28 DIAGNOSIS — I130 Hypertensive heart and chronic kidney disease with heart failure and stage 1 through stage 4 chronic kidney disease, or unspecified chronic kidney disease: Secondary | ICD-10-CM | POA: Diagnosis not present

## 2020-09-28 HISTORY — DX: Chronic kidney disease, stage 3 unspecified: N18.30

## 2020-09-28 HISTORY — DX: Chronic kidney disease, unspecified: N18.9

## 2020-09-28 HISTORY — DX: Dyspnea, unspecified: R06.00

## 2020-09-28 HISTORY — DX: Unspecified atrial fibrillation: I48.91

## 2020-09-28 HISTORY — DX: Type 2 diabetes mellitus with mild nonproliferative diabetic retinopathy without macular edema, unspecified eye: E11.3299

## 2020-09-28 LAB — COMPREHENSIVE METABOLIC PANEL
ALT: 26 U/L (ref 0–44)
AST: 52 U/L — ABNORMAL HIGH (ref 15–41)
Albumin: 3.1 g/dL — ABNORMAL LOW (ref 3.5–5.0)
Alkaline Phosphatase: 302 U/L — ABNORMAL HIGH (ref 38–126)
Anion gap: 11 (ref 5–15)
BUN: 49 mg/dL — ABNORMAL HIGH (ref 6–20)
CO2: 24 mmol/L (ref 22–32)
Calcium: 9.2 mg/dL (ref 8.9–10.3)
Chloride: 104 mmol/L (ref 98–111)
Creatinine, Ser: 1.53 mg/dL — ABNORMAL HIGH (ref 0.61–1.24)
GFR, Estimated: 52 mL/min — ABNORMAL LOW (ref >60)
Glucose, Bld: 163 mg/dL — ABNORMAL HIGH (ref 70–99)
Potassium: 4.8 mmol/L (ref 3.5–5.1)
Sodium: 139 mmol/L (ref 135–145)
Total Bilirubin: 2.3 mg/dL — ABNORMAL HIGH (ref 0.3–1.2)
Total Protein: 7.6 g/dL (ref 6.5–8.1)

## 2020-09-28 LAB — GLUCOSE, CAPILLARY
Glucose-Capillary: 123 mg/dL — ABNORMAL HIGH (ref 70–99)
Glucose-Capillary: 130 mg/dL — ABNORMAL HIGH (ref 70–99)
Glucose-Capillary: 179 mg/dL — ABNORMAL HIGH (ref 70–99)

## 2020-09-28 LAB — HIV ANTIBODY (ROUTINE TESTING W REFLEX): HIV Screen 4th Generation wRfx: NONREACTIVE

## 2020-09-28 MED ORDER — FUROSEMIDE 10 MG/ML IJ SOLN
12.0000 mg/h | INTRAVENOUS | Status: DC
  Administered 2020-09-28 – 2020-09-30 (×4): 12 mg/h via INTRAVENOUS
  Filled 2020-09-28 (×6): qty 20

## 2020-09-28 MED ORDER — ACETAMINOPHEN 325 MG PO TABS
650.0000 mg | ORAL_TABLET | ORAL | Status: DC | PRN
  Administered 2020-10-06 – 2020-10-11 (×8): 650 mg via ORAL
  Filled 2020-09-28 (×8): qty 2

## 2020-09-28 MED ORDER — FUROSEMIDE 10 MG/ML IJ SOLN
80.0000 mg | Freq: Once | INTRAMUSCULAR | Status: AC
Start: 2020-09-28 — End: 2020-09-28
  Administered 2020-09-28: 80 mg via INTRAVENOUS

## 2020-09-28 MED ORDER — ONDANSETRON HCL 4 MG/2ML IJ SOLN
4.0000 mg | Freq: Four times a day (QID) | INTRAMUSCULAR | Status: DC | PRN
  Administered 2020-10-03 – 2020-10-04 (×3): 4 mg via INTRAVENOUS
  Filled 2020-09-28 (×3): qty 2

## 2020-09-28 MED ORDER — SODIUM CHLORIDE 0.9% FLUSH
10.0000 mL | Freq: Two times a day (BID) | INTRAVENOUS | Status: DC
  Administered 2020-09-28 – 2020-10-03 (×10): 10 mL
  Administered 2020-10-03 – 2020-10-04 (×2): 20 mL
  Administered 2020-10-05: 10 mL
  Administered 2020-10-05: 20 mL
  Administered 2020-10-06 – 2020-10-11 (×12): 10 mL

## 2020-09-28 MED ORDER — CARVEDILOL 3.125 MG PO TABS
3.1250 mg | ORAL_TABLET | Freq: Two times a day (BID) | ORAL | Status: DC
  Administered 2020-09-28: 3.125 mg via ORAL
  Filled 2020-09-28: qty 1

## 2020-09-28 MED ORDER — SODIUM CHLORIDE 0.9 % IV SOLN
250.0000 mL | INTRAVENOUS | Status: DC | PRN
  Administered 2020-09-28 – 2020-10-09 (×2): 250 mL via INTRAVENOUS

## 2020-09-28 MED ORDER — APIXABAN 5 MG PO TABS
5.0000 mg | ORAL_TABLET | Freq: Two times a day (BID) | ORAL | Status: DC
  Administered 2020-09-28 – 2020-10-02 (×9): 5 mg via ORAL
  Filled 2020-09-28 (×9): qty 1

## 2020-09-28 MED ORDER — ROSUVASTATIN CALCIUM 20 MG PO TABS
40.0000 mg | ORAL_TABLET | Freq: Every day | ORAL | Status: DC
Start: 2020-09-29 — End: 2020-10-13
  Administered 2020-09-29 – 2020-10-12 (×14): 40 mg via ORAL
  Filled 2020-09-28 (×15): qty 2

## 2020-09-28 MED ORDER — SODIUM CHLORIDE 0.9% FLUSH
10.0000 mL | INTRAVENOUS | Status: DC | PRN
Start: 2020-09-28 — End: 2020-10-13

## 2020-09-28 MED ORDER — SPIRONOLACTONE 12.5 MG HALF TABLET
12.5000 mg | ORAL_TABLET | Freq: Every day | ORAL | Status: DC
  Administered 2020-09-29 – 2020-10-01 (×3): 12.5 mg via ORAL
  Filled 2020-09-28 (×3): qty 1

## 2020-09-28 MED ORDER — INSULIN ASPART 100 UNIT/ML ~~LOC~~ SOLN
0.0000 [IU] | Freq: Three times a day (TID) | SUBCUTANEOUS | Status: DC
  Administered 2020-09-29 (×2): 2 [IU] via SUBCUTANEOUS
  Administered 2020-09-30 (×2): 3 [IU] via SUBCUTANEOUS
  Administered 2020-10-01: 2 [IU] via SUBCUTANEOUS
  Administered 2020-10-01: 3 [IU] via SUBCUTANEOUS
  Administered 2020-10-01: 5 [IU] via SUBCUTANEOUS
  Administered 2020-10-02: 3 [IU] via SUBCUTANEOUS
  Administered 2020-10-02: 5 [IU] via SUBCUTANEOUS
  Administered 2020-10-02: 3 [IU] via SUBCUTANEOUS
  Administered 2020-10-03: 5 [IU] via SUBCUTANEOUS

## 2020-09-28 MED ORDER — POTASSIUM CHLORIDE CRYS ER 20 MEQ PO TBCR
20.0000 meq | EXTENDED_RELEASE_TABLET | Freq: Once | ORAL | Status: AC
Start: 2020-09-28 — End: 2020-09-28
  Administered 2020-09-28: 20 meq via ORAL

## 2020-09-28 MED ORDER — EMPAGLIFLOZIN 10 MG PO TABS
10.0000 mg | ORAL_TABLET | Freq: Every day | ORAL | Status: DC
  Administered 2020-09-29 – 2020-10-01 (×3): 10 mg via ORAL
  Filled 2020-09-28 (×3): qty 1

## 2020-09-28 MED ORDER — ONDANSETRON 4 MG PO TBDP
4.0000 mg | ORAL_TABLET | Freq: Three times a day (TID) | ORAL | Status: DC | PRN
  Filled 2020-09-28: qty 1

## 2020-09-28 MED ORDER — CHLORHEXIDINE GLUCONATE CLOTH 2 % EX PADS
6.0000 | MEDICATED_PAD | Freq: Every day | CUTANEOUS | Status: DC
  Administered 2020-09-29 – 2020-10-12 (×13): 6 via TOPICAL

## 2020-09-28 NOTE — Telephone Encounter (Signed)
CSW submitted the following patient assistance applications to assist pt with medication costs concerns:  [employer] for [employer] [place] [employer] for [employer] for [employer] confirmation received for all applications- awaiting determination  Hopeful pt will get approval for medications to help with several hundred dollar medications costs each month  Will continue to follow and assist as needed  Jorge Ny, [facility] Desk#: [phone] Cell#: [phone]

## 2020-09-28 NOTE — Progress Notes (Signed)
Vital signs as charted. Patient voided additional 375 cc of clear  yellow urine.

## 2020-09-28 NOTE — Addendum Note (Signed)
Encounter addended by: Stanford Scotland, RN on: 09/28/2020 12:29 PM  Actions taken: Vitals modified

## 2020-09-28 NOTE — Progress Notes (Signed)
Pt stated that he is on insulin at home. Notified Cardiology.

## 2020-09-28 NOTE — Progress Notes (Signed)
Vital signs as charted. Patient voided 350 cc of clear yellow urine.

## 2020-09-28 NOTE — Progress Notes (Signed)
IV #22 inserted into left forearm by Cooper Render. Patient tolerated insertion. Lasix 80 mg IV given x 1 along with 20 meq potassium x 1. Will continue to monitor effects.

## 2020-09-28 NOTE — Progress Notes (Signed)
Pt. Voided an additional 350cc clear yellow urine

## 2020-09-28 NOTE — Progress Notes (Signed)
IV nurse informed us that we might not be able to get PICC insertion today for this patient since we have 2 PICC nurses available system wide. RN stated that 1 PICC Nurse is at [facility] and the other nurse is at [facility].

## 2020-09-28 NOTE — Addendum Note (Signed)
Encounter addended by: Stanford Scotland, RN on: 09/28/2020 11:32 AM  Actions taken: Vitals modified

## 2020-09-28 NOTE — Progress Notes (Signed)
ID:  Marcus Johnson., DOB [birth year], MRN [id number]   Provider location: [facility] Type of Visit: Established patient  PCP:  [name], MD  Cardiologist:  Dr. Aundra Dubin   History of Present Illness: Marcus Johnson. is a 60 y.o. male who has a h/o DM2, CAD s/p CABG 4098, systolic HF due to ischemic cardiomyopathy with EF 20-25% (echo 12/15), DM2 and CKD. He is s/p Medtronic single chamber ICD.  Admitted in 12/15 due to ADHF. Required short course milrinone for diuresis. Diuresed 30 pounds.   CPX 2/16 showed severely reduced functional capacity.  There was a significant disconnect between his symptoms and the CPX.  RHC in 6/16 showed fairly normal filling pressures and low but not markedly low cardiac output.   CPX (4/19) was submaximal but suggested severe limitation from HF.   He was admitted in 6/19 with marked volume overload and dyspnea.  Echo in 6/19 showed EF 20% with severe global hypokinesis.  LHC/RHC showed severe native vessel CAD with patent LIMA-D, SVG-OM, and SVG-PLV; cardiac index low.  He was started on milrinone and diuresed.  We discussed LVAD extensively in light of low output, creatinine that is trending up and concern for decompensation of RV over time. He was adamant that he did not want to pursue LVAD workup yet and did not want a referral for transplant evaluation. Milrinone was weaned off and he was discharged home.   Admitted 08/01/18-08/07/18 with volume overload and left foot wound. Diuresed 38 lbs with IV lasix and then transitioned back to torsemide 100 mg BID. ID was consulted with concerns for left foot osteomyelitis noted on CT scan. He got IV ancef q8 hours x 42 days. [facility] provided teaching and supplies for home antibiotics. Course complicated by AKI. DC weight 200 lbs.   He went into atrial flutter and had DCCV to NSR in 1/20.   Echo in 4/21 showed EF 25% with apical akinesis and diffuse hypokinesis relatively  preserved in lateral wall, mildly decreased RV systolic function, PASP 41 mmHg. CPX in 5/21 showed severe functional limitation suggestive of advanced HF. 5/21 Zio patch showed short atrial flutter runs, 3.8% PVCs.   He developed COVID-19 infection in 1/22.  Symptoms were predominantly GI.  He had nausea and vomiting for several weeks and was unable to take his meds.  I asked him to go to the ER during my last video visit with him but he stayed home.  He has gradually improved in terms of the nausea and vomiting.  Still gets nauseated but has been taking most of his meds for the last 3-4 days after being off them for at least 3 wks.  He is doing poorly.  Weight is up 16 lbs.  He has lightheaded spells, not necessarily when standing up.  No syncope or falls. He is in NSR today, but device interrogation appears to show periodic atrial fibrillation versus flutter runs.  He is able to walk around his house without problems but is short of breath walking up stairs or walking long distances.  +Orthopnea.  He has not taken his morning medications yet as he is a little nauseated.     Medtronic device interrogation: Fluid index > threshold with low thoracic impedance.  He has had episodes of atrial fibrillation versus flutter.    ECG (personally reviewed): NSR with PACs, poor RWP  Studies: Echo (12/15): EF 20-25% RV moderately HK CPX (2/16): RER 1.10, peak VO2 8.6,  VE/VCO2 slope 43.7 => severely reduced functional capacity.  RHC (6/16): mean RA 8, PA 48/19 mean 30, mean PCWP 14, CI 2.03 Fick/2.28 Thermo, PVR 3.6.  Echo (5/17): EF 25%, mild LV dilation, moderate diastolic dysfunction, normal RV size with mild to moderate systolic dysfunction, mild mitral regurgitation CPX (4/19): Peak VO2 7, VE/VCO2 slope 41, RER 1.02 => submaximal but still suggestive of severe HF limitation.  Echo (6/19): EF 20%, diffuse severe HK, moderately decreased RV systolic function, moderate TR.  LHC/RHC (6/19): Severe native vessel  CAD with patent LIMA-D, SVG-OM, and SVG-PLV; mean RA 15, PA 54/24 mean 37, mean PCWP 32, CI 1.86 Fick/1.64 thermo. PAPi 2, PVR 1.25 WU.  DC-CV 09/03/18 -->NSR  Echo (4/21): EF 25% with apical akinesis and diffuse hypokinesis relatively preserved in lateral wall, mildly decreased RV systolic function, PASP 41 mmHg. CPX (5/21): RER 1.1, VE/VCO2 slope 34, peak VO2 10.1 => severe functional limitation suggestive of severe HF limitation.  Zio (5/21): Zio patch showed short atrial flutter runs, 3.8% PVCs.   Labs:  8/15 LDL 88 12/15 K+ 4.2 creatinine 1.09  1/16 K 4.3, creatinine 1.34 4/16 K 4.5, creatinine 1.19 3/17 K 3.8, creatinine 1.22, HCT 35.3, BNP 1597 5/17 K 4.1, creatinine 1.25 6/18 LDL 138, K 4.4, creatinine 1.5 6/19 hgb 13.5, K 4.9, creatinine 1.53 => 1.5 => 1.4, BNP 992 08/07/2018: K 4.3 Creatinine 1.86  6/20: LDL 98, TSH normal, Hgb 13.5, K 4.3, creatinine 2.05 7/20: K 4.2, creatinine 2.56 => 2.03 8/20: LDL 34, HDL 22 3/21: K 4.1, creatinine 1.92 4/21: K 4, creatinine 1.96 6/21: K 5.2, creatinine 1.76 9/21: K 4.1, creatinine 1.74, hgb 14.1, digoxin < 0.2 11/21: LDL 83 1/22: COVID-19+, K 4.4, creatinine 2.33  Review of systems complete and found to be negative unless listed in HPI.   SH:  Social History   Socioeconomic History  . Marital status: Divorced    Spouse name: Not on file  . Number of children: Not on file  . Years of education: Not on file  . Highest education level: Not on file  Occupational History  . Not on file  Tobacco Use  . Smoking status: Never Smoker  . Smokeless tobacco: Never Used  Vaping Use  . Vaping Use: Never used  Substance and Sexual Activity  . Alcohol use: No    Alcohol/week: 0.0 standard drinks  . Drug use: No  . Sexual activity: Not on file  Other Topics Concern  . Not on file  Social History Narrative   Lives in [place] with his son and his family.  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   Social  Determinants of Health   Financial Resource Strain: Not on file  Food Insecurity: Not on file  Transportation Needs: Not on file  Physical Activity: Not on file  Stress: Not on file  Social Connections: Not on file  Intimate Partner Violence: Not on file    FH:  Family History  Problem Relation Age of Onset  . Diabetes Father        died in his 28's  . Hypertension Mother        died in her 3's - had a ppm.  . Amblyopia Neg Hx   . Blindness Neg Hx   . Cataracts Neg Hx   . Glaucoma Neg Hx   . Macular degeneration Neg Hx   . Retinal detachment Neg Hx   . Strabismus Neg Hx   . Retinitis  pigmentosa Neg Hx     Past Medical History:  Diagnosis Date  . AICD (automatic cardioverter/defibrillator) present    Single-chamber  implantable cardiac defibrillator - Medtronic  . Cataract    Mixed form OU  . CHF (congestive heart failure) ([place])   . Chronic systolic heart failure ([place])    a. Echo 5/13: Mild LVE, mild LVH, EF 10%, anteroseptal, lateral, apical AK, mild MR, mild LAE, moderately reduced RVSF, mild RAE, PASP 60;  b. 07/2014 Echo: EF 20-2%, diff HK, AKI of antsep/apical/mid-apicalinferior, mod reduced RV.  Marland Kitchen Coronary artery disease    a. s/p CABG 2002 b. LHC 5/13:  dLM 80%, LAD subtotally occluded, pCFX occluded, pRCA 50%, mid? Occlusion with high take off of the PDA with 70% multiple lesions-not bypassed and supplies collaterals to LAD, LIMA-IM/ramus ok, S-OM ok, S-PLA branches ok. Medical therapy was recommended  . COVID   . Diabetic retinopathy (HCC)    NPDR OD, PDR OS  . Gout    "on daily RX" (01/08/2018)  . Hypertension   . Hypertensive retinopathy    OU  . Ischemic cardiomyopathy    a. Echo 5/13: Mild LVE, mild LVH, EF 10%, anteroseptal, lateral, apical AK, mild MR, mild LAE, moderately reduced RVSF, mild RAE, PASP 60;  b. 01/2012 s/p MDT [id number] Protecta XT VR AICD;  c. 07/2014 Echo: EF 20-2%, diff HK, AKI of antsep/apical/mid-apicalinferior, mod reduced RV.  Marland Kitchen MRSA  (methicillin resistant Staphylococcus aureus)    Status post right foot plantar deep infection with MRSA status post  I&D 10/2008  . Myocardial infarction [facility])    "was told I'd had several before heart OR in 2002" (01/08/2018)  . Noncompliance   . Peripheral neuropathy   . Syncope   . Type II diabetes mellitus ([place])    requiring insulin   . Vitreous hemorrhage, left (HCC)    and proliferative diabetic retinopathy    Current Outpatient Medications  Medication Sig Dispense Refill  . Accu-Chek Softclix Lancets lancets Use 1-4 times daily as needed. DX E11.22 360 each 3  . allopurinol (ZYLOPRIM) 300 MG tablet Take 600 mg by mouth daily as needed (gout).    . blood glucose meter kit and supplies KIT Use as directed. 1 each 0  . Blood Glucose Monitoring Suppl (ACCU-CHEK GUIDE) w/Device KIT Use 1-4 times daily as needed. DX E11.22 1 kit 0  . carvedilol (COREG) 12.5 MG tablet TAKE 1 TABLET (12.5 MG TOTAL) BY MOUTH 2 (TWO) TIMES DAILY. 30 tablet 11  . digoxin (LANOXIN) 0.125 MG tablet TAKE 1 TABLET BY MOUTH EVERY DAY 90 tablet 3  . ELIQUIS 5 MG TABS tablet TAKE 1 TABLET BY MOUTH TWICE A DAY 60 tablet 6  . empagliflozin (JARDIANCE) 10 MG TABS tablet Take 1 tablet (10 mg total) by mouth daily before breakfast. 30 tablet 6  . glucose blood (ACCU-CHEK GUIDE) test strip Use 1-4 times daily as needed. DX E11.22 100 each 12  . insulin aspart (NOVOLOG FLEXPEN) 100 UNIT/ML FlexPen INJECT 5 UNITS INTO THE SKIN 3 TIMES DAILY WITH MEALS 15 mL 6  . insulin glargine (LANTUS SOLOSTAR) 100 UNIT/ML Solostar Pen INJECT 50 UNITS INTO THE SKIN AT BEDTIME. 15 mL 11  . Insulin Pen Needle (BD PEN NEEDLE NANO U/F) 32G X 4 MM MISC USE 4 TIMES A DAY 100 each 11  . ketoconazole (NIZORAL) 2 % cream Apply 1 application topically 2 (two) times daily as needed for irritation. 30 g 5  . KLOR-CON M20 20 MEQ  tablet TAKE 1 TABLET (20 MEQ TOTAL) BY MOUTH DAILY. TAKE 1 TABLET EXTRA ON DAYS YOU TAKE METOLAZONE. 90 tablet 3  .  metolazone (ZAROXOLYN) 2.5 MG tablet Take 1 tablet (2.5 mg total) by mouth once a week. On Saturdays 5 tablet 3  . ondansetron (ZOFRAN ODT) 4 MG disintegrating tablet Take 1 tablet (4 mg total) by mouth every 8 (eight) hours as needed for nausea or vomiting. 20 tablet 0  . rosuvastatin (CRESTOR) 40 MG tablet TAKE 1 TABLET BY MOUTH EVERY DAY 90 tablet 3  . sacubitril-valsartan (ENTRESTO) 97-103 MG Take 1 tablet by mouth 2 (two) times daily. 60 tablet 6  . spironolactone (ALDACTONE) 25 MG tablet TAKE 1 TABLET BY MOUTH EVERY DAY 90 tablet 0  . torsemide (DEMADEX) 20 MG tablet Take 4 tablets (80 mg total) by mouth in the morning AND 2 tablets (40 mg total) daily after supper. 180 tablet 3   No current facility-administered medications for this encounter.    Vitals:   09/28/20 0839  BP: 118/70  Pulse: (!) 113  SpO2: 99%  Weight: 100.3 kg (221 lb 3.2 oz)   Wt Readings from Last 3 Encounters:  09/28/20 100.3 kg (221 lb 3.2 oz)  09/06/20 92.5 kg (204 lb)  07/04/20 92.1 kg (203 lb)    PHYSICAL EXAM: General: NAD Neck: JVP 16+ cm, no thyromegaly or thyroid nodule.  Lungs: Decreased at bases.  CV: Lateral PMI.  Heart mildly tachy, regular S1/S2, no S3/S4, no murmur.  2+ edema to knees.  No carotid bruit.  Normal pedal pulses.  Abdomen: Soft, nontender, no hepatosplenomegaly, moderate distention.  Skin: Intact without lesions or rashes.  Neurologic: Alert and oriented x 3.  Psych: Normal affect. Extremities: No clubbing or cyanosis.  HEENT: Normal.   ASSESSMENT & PLAN: 1. COVID-19 infection: in 1/22.  Manifested primarily as nausea/vomiting.  Very slow recovery, did not take meds for at least 3 wks, has been taking meds for 3-4 days but still with some nausea.   2.Acute on chronic systolic HF:ICM.HasMedtronic ICD. Narrow QRS, not CRT candidate. Has required milrinone multiple times in the past for low output HF.  Echo 6/19 with EF 20% and moderately decreased RV systolic function.CPX  4/19 with severe functional limitation due to [facility] 6/19 showed low output and coronary angiography showed no interventional targets.Echo in 4/21 showed EF 25%, mildly decreased RV systolic function.  CPX in 5/21 showed severe HF limitation.  He has been very resistant to LVAD/transplant evaluation, both offered with multiple discussions in past.  I worry that we will miss the window for advanced therapies. He was not interested in barostimulator activation therapy.  History of cardiorenal syndrome/diabetic nephropathy, creatinine up to 2.33 in 1/22, likely due to dehydration with nausea/vomiting.   He had been off meds for several weeks, just restarted 3-4 days ago.  He is markedly volume overloaded by exam and Optivol with NYHA class IIIb symptoms. I think he will need to be admitted, I am concerned creatinine could be worse and suspect he will need milrinone.  - Will place PICC and send co-ox/follow CVP.  - Lasix 80 mg IV x 1, then start gtt at 12 mg/hr.    - Continue digoxin, check level. - Hold other cardiac meds (Entresto, Coreg, empagliflozin, spironolactone) for now until we get BMET back.  2. CAD s/p CABG2002:Last cath in 6/19 with patent grafts. No chest pain.  - No ASA given apixaban use.  - Continue Crestor 40 mg daily.  3.  CKD Stage 3:  I suspect that this is a combination of cardiorenal syndrome and diabetic nephropathy.  - BMET stat today.  4. DM: History of very poor control but hgbA1c trending down.  - Cover with SSI.   5. Atrial flutter: s/p DC-CV 08/21/18.  He is in NSR currently but note runs of ?flutter vs fibrillation on device interrogation.  - Follow on telemetry, may need amiodarone to keep him in NSR.  - Continue Eliquis.    Signed, Loralie Champagne, MD  09/28/2020  [facility] [address] [facility] [place] [address] [phone] (office) [phone] (fax)

## 2020-09-28 NOTE — Progress Notes (Signed)
Report called to 3E, VS obtained, BP 128/80, HR 96,, O2 sat 98%   Pt transferred

## 2020-09-28 NOTE — Progress Notes (Signed)
Patient voided 300cc clear yellow urine

## 2020-09-28 NOTE — Progress Notes (Signed)
Paramedicine Initial Assessment:  Housing:  In what kind of housing do you live? House/apt/trailer/shelter? apartment  Do you rent/pay a mortgage/own? rent  Do you live with anyone? Live with brother and son, 57 and 60 year old granddaughters  Are you currently worried about losing your housing? no  Within the past 12 months have you ever stayed outside, in a car, tent, a shelter, or temporarily with someone? no  Within the past 12 months have you been unable to get utilities when it was really needed? no  Social:  What is your current marital status? single  Do you have any children? son  Food:  Within the past 12 months were you ever worried that food would run out before you got money to buy more? no  Within the past 71months have you run out of food and didn't have money to buy more? no  Income:  What is your current source of income? SSDI, $2000/month  How hard is it for you to pay for the basics like food housing, medical care, and utilities? Somewhat hard  Do you have outstanding medical bills? no  Insurance:  Are you currently insured? Yes- [employer] Medicare  Do you have prescription coverage? yes  Transportation:  Do you have transportation to your medical appointments? Has a car and has his son or brother to drive him to appts due to concerns with vision with cataracts.  In the past 12 months has lack of transportation kept you from medical appts or from getting medications? no  In the past 12 months has lack of transportation kept you from meetings, work, or getting things you needed? no   Daily Health Needs: Do you have a working scale at home? yes  How do you manage your medications at home? Out of the bottle  Do you ever take your medications differently than prescribed? Not intentionally  Do you have issues affording your medications? Yes has been hard  If yes, has this ever prevented you from obtaining medications? Not yet but its a struggle  Do  you have any concerns with mobility at home? Able to complete all ADLs has been worried about dizziness recently.  Do you use any assistive devices at home or have PCS at home? no  Do you have a PCP? Jonathon Jordan  Do you have any trouble reading or writing? no  Are there any additional barriers you see to getting the care you need? no  CSW will continue to follow through paramedicine program and assist as needed.  Jorge Ny, LCSW Clinical Social Worker [facility] Desk#: [phone] Cell#: [phone]

## 2020-09-28 NOTE — Progress Notes (Signed)
Orthopedic Tech Progress Note Patient Details:  Marcus Johnson. 07-21-1961 [id number]  Ortho Devices Type of Ortho Device: Louretta Parma boot Ortho Device/Splint Location: BLE Ortho Device/Splint Interventions: Ordered,Application   Post Interventions Patient Tolerated: Well Instructions Provided: Care of device   Janit Pagan 09/28/2020, 4:42 PM

## 2020-09-28 NOTE — Addendum Note (Signed)
Encounter addended by: Stanford Scotland, RN on: 09/28/2020 11:35 AM  Actions taken: Clinical Note Signed

## 2020-09-28 NOTE — Addendum Note (Signed)
Encounter addended by: Stanford Scotland, RN on: 09/28/2020 1:57 PM  Actions taken: Clinical Note Signed

## 2020-09-28 NOTE — Addendum Note (Signed)
Encounter addended by: Stanford Scotland, RN on: 09/28/2020 1:32 PM  Actions taken: Clinical Note Signed

## 2020-09-28 NOTE — Addendum Note (Signed)
Encounter addended by: Scarlette Calico, RN on: 09/28/2020 2:00 PM  Actions taken: Clinical Note Signed

## 2020-09-28 NOTE — Progress Notes (Signed)
Peripherally Inserted Central Catheter Placement  The IV Nurse has discussed with the patient and/or persons authorized to consent for the patient, the purpose of this procedure and the potential benefits and risks involved with this procedure.  The benefits include less needle sticks, lab draws from the catheter, and the patient may be discharged home with the catheter. Risks include, but not limited to, infection, bleeding, blood clot (thrombus formation), and puncture of an artery; nerve damage and irregular heartbeat and possibility to perform a PICC exchange if needed/ordered by physician.  Alternatives to this procedure were also discussed.  Bard Power PICC patient education guide, fact sheet on infection prevention and patient information card has been provided to patient /or left at bedside.    PICC Placement Documentation  PICC Double Lumen 08/01/18 PICC Right Brachial 41 cm 3 cm (Active)     PICC Double Lumen 77/11/65 PICC Right Basilic 45 cm 2 cm (Active)  Indication for Insertion or Continuance of Line Vasoactive infusions 09/28/20 2120  Exposed Catheter (cm) 2 cm 09/28/20 2120  Site Assessment Clean;Dry;Intact 09/28/20 2120  Lumen #1 Status Flushed;Blood return noted;Capped (Central line) 09/28/20 2120  Lumen #2 Status Flushed;Blood return noted;Capped (Central line) 09/28/20 2120  Dressing Type Transparent;Securing device 09/28/20 2120  Dressing Status Clean;Dry;Intact 09/28/20 2120  Antimicrobial disc in place? Yes 09/28/20 2120  Safety Lock Not Applicable 79/03/83 3383  Line Care Connections checked and tightened 09/28/20 2120  Dressing Intervention New dressing;Other (Comment) 09/28/20 2120  Dressing Change Due 10/05/20 09/28/20 2120       Claudetta Sallie, Cathlyn Parsons 09/28/2020, 10:06 PM

## 2020-09-28 NOTE — H&P (Signed)
ADVANCED HEART FAILURE H&P  This note reflects work completed today.   ID:  Marcus Richer., DOB 27-Feb-1961, MRN [id number]   Provider location: [facility] Type of Visit: Established patient  PCP:  [name], MD      Cardiologist:  Dr. Shirlee Latch   History of Present Illness: Marcus Johnson. is a 60 y.o. male who has a h/o DM2, CAD s/p CABG 2002, systolic HF due to ischemic cardiomyopathy with EF 20-25% (echo 12/15), DM2 and CKD. He is s/p Medtronic single chamber ICD.  Admitted in 12/15 due to ADHF. Required short course milrinone for diuresis. Diuresed 30 pounds.   CPX 2/16 showed severely reduced functional capacity.  There was a significant disconnect between his symptoms and the CPX.  RHC in 6/16 showed fairly normal filling pressures and low but not markedly low cardiac output.   CPX (4/19) was submaximal but suggested severe limitation from HF.   He was admitted in 6/19 with marked volume overload and dyspnea.  Echo in 6/19 showed EF 20% with severe global hypokinesis.  LHC/RHC showed severe native vessel CAD with patent LIMA-D, SVG-OM, and SVG-PLV; cardiac index low.  He was started on milrinone and diuresed.  We discussed LVAD extensively in light of low output, creatinine that is trending up and concern for decompensation of RV over time. He was adamant that he did not want to pursue LVAD workup yet and did not want a referral for transplant evaluation. Milrinone was weaned off and he was discharged home.   Admitted 08/01/18-08/07/18 with volume overload and left foot wound. Diuresed 38 lbs with IV lasix and then transitioned back to torsemide 100 mg BID. ID was consulted with concerns for left foot osteomyelitis noted on CT scan. He got IV ancef q8 hours x 42 days. [facility] provided teaching and supplies for home antibiotics.Course complicated by AKI. DC weight 200 lbs.   He went into atrial flutter and had DCCV to NSR in 1/20.   Echo in  4/21 showed EF 25% with apical akinesis and diffuse hypokinesis relatively preserved in lateral wall, mildly decreased RV systolic function, PASP 41 mmHg. CPX in 5/21 showed severe functional limitation suggestive of advanced HF. 5/21 Zio patch showed short atrial flutter runs, 3.8% PVCs.   He developed COVID-19 infection in 1/22.  Symptoms were predominantly GI.  He had nausea and vomiting for several weeks and was unable to take his meds.  I asked him to go to the ER during my last video visit with him but he stayed home.  He has gradually improved in terms of the nausea and vomiting.  Still gets nauseated but has been taking most of his meds for the last 3-4 days after being off them for at least 3 wks.  He is doing poorly.  Weight is up 16 lbs.  He has lightheaded spells, not necessarily when standing up.  No syncope or falls. He is in NSR today, but device interrogation appears to show periodic atrial fibrillation versus flutter runs.  He is able to walk around his house without problems but is short of breath walking up stairs or walking long distances.  +Orthopnea.  He has not taken his morning medications yet as he is a little nauseated.     Medtronic device interrogation: Fluid index > threshold with low thoracic impedance.  He has had episodes of atrial fibrillation versus flutter.    ECG (personally reviewed): NSR with PACs, poor RWP  Studies:  Echo (12/15): EF 20-25% RV moderately HK CPX (2/16): RER 1.10, peak VO2 8.6, VE/VCO2 slope 43.7 => severely reduced functional capacity.  RHC (6/16): mean RA 8, PA 48/19 mean 30, mean PCWP 14, CI 2.03 Fick/2.28 Thermo, PVR 3.6.  Echo (5/17): EF 25%, mild LV dilation, moderate diastolic dysfunction, normal RV size with mild to moderate systolic dysfunction, mild mitral regurgitation CPX (4/19): Peak VO2 7, VE/VCO2 slope 41, RER 1.02 => submaximal but still suggestive of severe HF limitation.  Echo (6/19): EF 20%, diffuse severe HK, moderately  decreased RV systolic function, moderate TR.  LHC/RHC (6/19): Severe native vessel CAD with patent LIMA-D, SVG-OM, and SVG-PLV; mean RA 15, PA 54/24 mean 37, mean PCWP 32, CI 1.86 Fick/1.64 thermo. PAPi 2, PVR 1.25 WU.  DC-CV 09/03/18 -->NSR  Echo (4/21): EF 25% with apical akinesis and diffuse hypokinesis relatively preserved in lateral wall, mildly decreased RV systolic function, PASP 41 mmHg. CPX (5/21): RER 1.1, VE/VCO2 slope 34, peak VO2 10.1 => severe functional limitation suggestive of severe HF limitation.  Zio (5/21): Zio patch showed short atrial flutter runs, 3.8% PVCs.   Labs:  8/15 LDL 88 12/15 K+ 4.2 creatinine 1.09  1/16 K 4.3, creatinine 1.34 4/16 K 4.5, creatinine 1.19 3/17 K 3.8, creatinine 1.22, HCT 35.3, BNP 1597 5/17 K 4.1, creatinine 1.25 6/18 LDL 138, K 4.4, creatinine 1.5 6/19 hgb 13.5, K 4.9, creatinine 1.53 => 1.5 => 1.4, BNP 992 08/07/2018: K 4.3 Creatinine 1.86  6/20: LDL 98, TSH normal, Hgb 13.5, K 4.3, creatinine 2.05 7/20: K 4.2, creatinine 2.56 => 2.03 8/20: LDL 34, HDL 22 3/21: K 4.1, creatinine 1.92 4/21: K 4, creatinine 1.96 6/21: K 5.2, creatinine 1.76 9/21: K 4.1, creatinine 1.74, hgb 14.1, digoxin < 0.2 11/21: LDL 83 1/22: COVID-19+, K 4.4, creatinine 2.33  Review of systems complete and found to be negative unless listed in HPI.   SH:  Social History        Socioeconomic History  . Marital status: Divorced    Spouse name: Not on file  . Number of children: Not on file  . Years of education: Not on file  . Highest education level: Not on file  Occupational History  . Not on file  Tobacco Use  . Smoking status: Never Smoker  . Smokeless tobacco: Never Used  Vaping Use  . Vaping Use: Never used  Substance and Sexual Activity  . Alcohol use: No    Alcohol/week: 0.0 standard drinks  . Drug use: No  . Sexual activity: Not on file  Other Topics Concern  . Not on file  Social History Narrative   Lives in [place] with his son and  his family.  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   Social Determinants of Health   Financial Resource Strain: Not on file  Food Insecurity: Not on file  Transportation Needs: Not on file  Physical Activity: Not on file  Stress: Not on file  Social Connections: Not on file  Intimate Partner Violence: Not on file    FH:       Family History  Problem Relation Age of Onset  . Diabetes Father        died in his 64's  . Hypertension Mother        died in her 49's - had a ppm.  . Amblyopia Neg Hx   . Blindness Neg Hx   . Cataracts Neg Hx   . Glaucoma Neg Hx   .  Macular degeneration Neg Hx   . Retinal detachment Neg Hx   . Strabismus Neg Hx   . Retinitis pigmentosa Neg Hx         Past Medical History:  Diagnosis Date  . AICD (automatic cardioverter/defibrillator) present    Single-chamber  implantable cardiac defibrillator - Medtronic  . Cataract    Mixed form OU  . CHF (congestive heart failure) ([facility])   . Chronic systolic heart failure ([facility])    a. Echo 5/13: Mild LVE, mild LVH, EF 10%, anteroseptal, lateral, apical AK, mild MR, mild LAE, moderately reduced RVSF, mild RAE, PASP 60;  b. 07/2014 Echo: EF 20-2%, diff HK, AKI of antsep/apical/mid-apicalinferior, mod reduced RV.  Marland Kitchen Coronary artery disease    a. s/p CABG 2002 b. LHC 5/13:  dLM 80%, LAD subtotally occluded, pCFX occluded, pRCA 50%, mid? Occlusion with high take off of the PDA with 70% multiple lesions-not bypassed and supplies collaterals to LAD, LIMA-IM/ramus ok, S-OM ok, S-PLA branches ok. Medical therapy was recommended  . COVID   . Diabetic retinopathy (HCC)    NPDR OD, PDR OS  . Gout    "on daily RX" (01/08/2018)  . Hypertension   . Hypertensive retinopathy    OU  . Ischemic cardiomyopathy    a. Echo 5/13: Mild LVE, mild LVH, EF 10%, anteroseptal, lateral, apical AK, mild MR, mild LAE, moderately reduced RVSF, mild RAE, PASP 60;  b.  01/2012 s/p MDT [id number] Protecta XT VR AICD;  c. 07/2014 Echo: EF 20-2%, diff HK, AKI of antsep/apical/mid-apicalinferior, mod reduced RV.  Marland Kitchen MRSA (methicillin resistant Staphylococcus aureus)    Status post right foot plantar deep infection with MRSA status post  I&D 10/2008  . Myocardial infarction [facility])    "was told I'd had several before heart OR in 2002" (01/08/2018)  . Noncompliance   . Peripheral neuropathy   . Syncope   . Type II diabetes mellitus ([place])    requiring insulin   . Vitreous hemorrhage, left (HCC)    and proliferative diabetic retinopathy          Current Outpatient Medications  Medication Sig Dispense Refill  . Accu-Chek Softclix Lancets lancets Use 1-4 times daily as needed. DX E11.22 360 each 3  . allopurinol (ZYLOPRIM) 300 MG tablet Take 600 mg by mouth daily as needed (gout).    . blood glucose meter kit and supplies KIT Use as directed. 1 each 0  . Blood Glucose Monitoring Suppl (ACCU-CHEK GUIDE) w/Device KIT Use 1-4 times daily as needed. DX E11.22 1 kit 0  . carvedilol (COREG) 12.5 MG tablet TAKE 1 TABLET (12.5 MG TOTAL) BY MOUTH 2 (TWO) TIMES DAILY. 30 tablet 11  . digoxin (LANOXIN) 0.125 MG tablet TAKE 1 TABLET BY MOUTH EVERY DAY 90 tablet 3  . ELIQUIS 5 MG TABS tablet TAKE 1 TABLET BY MOUTH TWICE A DAY 60 tablet 6  . empagliflozin (JARDIANCE) 10 MG TABS tablet Take 1 tablet (10 mg total) by mouth daily before breakfast. 30 tablet 6  . glucose blood (ACCU-CHEK GUIDE) test strip Use 1-4 times daily as needed. DX E11.22 100 each 12  . insulin aspart (NOVOLOG FLEXPEN) 100 UNIT/ML FlexPen INJECT 5 UNITS INTO THE SKIN 3 TIMES DAILY WITH MEALS 15 mL 6  . insulin glargine (LANTUS SOLOSTAR) 100 UNIT/ML Solostar Pen INJECT 50 UNITS INTO THE SKIN AT BEDTIME. 15 mL 11  . Insulin Pen Needle (BD PEN NEEDLE NANO U/F) 32G X 4 MM MISC USE 4 TIMES A DAY  100 each 11  . ketoconazole (NIZORAL) 2 % cream Apply 1 application topically 2 (two) times daily as needed for  irritation. 30 g 5  . KLOR-CON M20 20 MEQ tablet TAKE 1 TABLET (20 MEQ TOTAL) BY MOUTH DAILY. TAKE 1 TABLET EXTRA ON DAYS YOU TAKE METOLAZONE. 90 tablet 3  . metolazone (ZAROXOLYN) 2.5 MG tablet Take 1 tablet (2.5 mg total) by mouth once a week. On Saturdays 5 tablet 3  . ondansetron (ZOFRAN ODT) 4 MG disintegrating tablet Take 1 tablet (4 mg total) by mouth every 8 (eight) hours as needed for nausea or vomiting. 20 tablet 0  . rosuvastatin (CRESTOR) 40 MG tablet TAKE 1 TABLET BY MOUTH EVERY DAY 90 tablet 3  . sacubitril-valsartan (ENTRESTO) 97-103 MG Take 1 tablet by mouth 2 (two) times daily. 60 tablet 6  . spironolactone (ALDACTONE) 25 MG tablet TAKE 1 TABLET BY MOUTH EVERY DAY 90 tablet 0  . torsemide (DEMADEX) 20 MG tablet Take 4 tablets (80 mg total) by mouth in the morning AND 2 tablets (40 mg total) daily after supper. 180 tablet 3   No current facility-administered medications for this encounter.    Vitals:   09/28/20 0839  BP: 118/70  Pulse: (!) 113  SpO2: 99%  Weight: 100.3 kg (221 lb 3.2 oz)      Wt Readings from Last 3 Encounters:  09/28/20 100.3 kg (221 lb 3.2 oz)  09/06/20 92.5 kg (204 lb)  07/04/20 92.1 kg (203 lb)    PHYSICAL EXAM: General: NAD Neck: JVP 16+ cm, no thyromegaly or thyroid nodule.  Lungs: Decreased at bases.  CV: Lateral PMI.  Heart mildly tachy, regular S1/S2, no S3/S4, no murmur.  2+ edema to knees.  No carotid bruit.  Normal pedal pulses.  Abdomen: Soft, nontender, no hepatosplenomegaly, moderate distention.  Skin: Intact without lesions or rashes.  Neurologic: Alert and oriented x 3.  Psych: Normal affect. Extremities: No clubbing or cyanosis.  HEENT: Normal.   ASSESSMENT & PLAN: 1. COVID-19 infection: in 1/22.  Manifested primarily as nausea/vomiting.  Very slow recovery, did not take meds for at least 3 wks, has been taking meds for 3-4 days but still with some nausea.   2.Acute on chronic systolic HF:ICM.HasMedtronic ICD.  Narrow QRS, not CRT candidate. Has required milrinone multiple times in the past for low output HF.  Echo 6/19 with EF 20% and moderately decreased RV systolic function.CPX 4/19 with severe functional limitation due to [facility] 6/19 showed low output and coronary angiography showed no interventional targets.Echo in 4/21 showed EF 25%, mildly decreased RV systolic function.  CPX in 5/21 showed severe HF limitation.  He has been very resistant to LVAD/transplant evaluation, both offered with multiple discussions in past.  I worry that we will miss the window for advanced therapies. He was not interested in barostimulator activation therapy.  History of cardiorenal syndrome/diabetic nephropathy, creatinine up to 2.33 in 1/22, likely due to dehydration with nausea/vomiting.   He had been off meds for several weeks, just restarted 3-4 days ago.  He is markedly volume overloaded by exam and Optivol with NYHA class IIIb symptoms. I think he will need to be admitted, I am concerned creatinine could be worse and suspect he will need milrinone.  - Will place PICC and send co-ox/follow CVP.  - Lasix 80 mg IV x 1, then start gtt at 12 mg/hr.    - Continue digoxin, check level. - Hold other cardiac meds (Entresto, Coreg, empagliflozin, spironolactone) for now until  we get BMET back.  2. CAD s/p CABG2002:Last cath in 6/19 with patent grafts. No chest pain.  -No ASA given apixaban use.  - Continue Crestor 40 mg daily.  3. CKD Stage 3:  I suspect that this is a combination of cardiorenal syndrome and diabetic nephropathy.  - BMET stat today.  4. UJ:WJXBJYN of very poor control but hgbA1c trending down.  - Cover with SSI.   5. Atrial flutter: s/p DC-CV 08/21/18.  He is in NSR currently but note runs of ?flutter vs fibrillation on device interrogation.  - Follow on telemetry, may need amiodarone to keep him in NSR.  - Continue Eliquis.    Signed, Loralie Champagne, MD  09/28/2020  [facility] [address] [facility] [place] [address] [phone] (office) [phone] (fax)

## 2020-09-28 NOTE — Addendum Note (Signed)
Encounter addended by: Stanford Scotland, RN on: 09/28/2020 1:28 PM  Actions taken: Vitals modified

## 2020-09-29 ENCOUNTER — Inpatient Hospital Stay (HOSPITAL_COMMUNITY): Payer: Medicare HMO

## 2020-09-29 ENCOUNTER — Encounter (HOSPITAL_COMMUNITY): Payer: Self-pay | Admitting: Internal Medicine

## 2020-09-29 DIAGNOSIS — I5023 Acute on chronic systolic (congestive) heart failure: Secondary | ICD-10-CM

## 2020-09-29 DIAGNOSIS — Z66 Do not resuscitate: Secondary | ICD-10-CM | POA: Diagnosis not present

## 2020-09-29 DIAGNOSIS — R570 Cardiogenic shock: Secondary | ICD-10-CM | POA: Diagnosis not present

## 2020-09-29 DIAGNOSIS — D689 Coagulation defect, unspecified: Secondary | ICD-10-CM | POA: Diagnosis not present

## 2020-09-29 DIAGNOSIS — N170 Acute kidney failure with tubular necrosis: Secondary | ICD-10-CM | POA: Diagnosis not present

## 2020-09-29 DIAGNOSIS — I132 Hypertensive heart and chronic kidney disease with heart failure and with stage 5 chronic kidney disease, or end stage renal disease: Secondary | ICD-10-CM | POA: Diagnosis not present

## 2020-09-29 DIAGNOSIS — I4892 Unspecified atrial flutter: Secondary | ICD-10-CM | POA: Diagnosis not present

## 2020-09-29 DIAGNOSIS — E46 Unspecified protein-calorie malnutrition: Secondary | ICD-10-CM | POA: Diagnosis not present

## 2020-09-29 DIAGNOSIS — N186 End stage renal disease: Secondary | ICD-10-CM | POA: Diagnosis not present

## 2020-09-29 LAB — GLUCOSE, CAPILLARY
Glucose-Capillary: 72 mg/dL (ref 70–99)
Glucose-Capillary: 150 mg/dL — ABNORMAL HIGH (ref 70–99)
Glucose-Capillary: 148 mg/dL — ABNORMAL HIGH (ref 70–99)
Glucose-Capillary: 112 mg/dL — ABNORMAL HIGH (ref 70–99)

## 2020-09-29 LAB — BASIC METABOLIC PANEL
Anion gap: 10 (ref 5–15)
BUN: 49 mg/dL — ABNORMAL HIGH (ref 6–20)
CO2: 25 mmol/L (ref 22–32)
Calcium: 9.1 mg/dL (ref 8.9–10.3)
Chloride: 104 mmol/L (ref 98–111)
Creatinine, Ser: 1.7 mg/dL — ABNORMAL HIGH (ref 0.61–1.24)
GFR, Estimated: 46 mL/min — ABNORMAL LOW (ref >60)
Glucose, Bld: 107 mg/dL — ABNORMAL HIGH (ref 70–99)
Potassium: 3.6 mmol/L (ref 3.5–5.1)
Sodium: 139 mmol/L (ref 135–145)

## 2020-09-29 LAB — DIGOXIN LEVEL
Digoxin Level: <0.2 ng/mL — ABNORMAL LOW (ref 0.8–2.0)
Digoxin Level: <0.2 ng/mL — ABNORMAL LOW (ref 0.8–2.0)

## 2020-09-29 LAB — ECHOCARDIOGRAM COMPLETE
Area-P 1/2: 5.38 cm2
Calc EF: 21.6 %
S' Lateral: 4.4 cm
Single Plane A2C EF: 16.6 %
Single Plane A4C EF: 22.2 %
Weight: 3438.4 oz

## 2020-09-29 LAB — COOXEMETRY PANEL
Carboxyhemoglobin: 1.3 % (ref 0.5–1.5)
Carboxyhemoglobin: 1.6 % — ABNORMAL HIGH (ref 0.5–1.5)
Methemoglobin: 0.7 % (ref 0.0–1.5)
Methemoglobin: 0.9 % (ref 0.0–1.5)
O2 Saturation: 35 %
O2 Saturation: 43.8 %
Total hemoglobin: 11.6 g/dL — ABNORMAL LOW (ref 12.0–16.0)
Total hemoglobin: 11.7 g/dL — ABNORMAL LOW (ref 12.0–16.0)

## 2020-09-29 LAB — HEMOGLOBIN A1C
Hgb A1c MFr Bld: 7.7 % — ABNORMAL HIGH (ref 4.8–5.6)
Mean Plasma Glucose: 174.29 mg/dL

## 2020-09-29 MED ORDER — METOLAZONE 2.5 MG PO TABS
2.5000 mg | ORAL_TABLET | Freq: Once | ORAL | Status: AC
  Administered 2020-09-29: 2.5 mg via ORAL
  Filled 2020-09-29: qty 1

## 2020-09-29 MED ORDER — DIGOXIN 125 MCG PO TABS
0.1250 mg | ORAL_TABLET | Freq: Every day | ORAL | Status: DC
  Administered 2020-09-29 – 2020-10-02 (×4): 0.125 mg via ORAL
  Filled 2020-09-29 (×4): qty 1

## 2020-09-29 MED ORDER — CARVEDILOL 3.125 MG PO TABS
3.1250 mg | ORAL_TABLET | Freq: Two times a day (BID) | ORAL | Status: DC
  Administered 2020-09-29 – 2020-10-01 (×5): 3.125 mg via ORAL
  Filled 2020-09-29 (×5): qty 1

## 2020-09-29 MED ORDER — MILRINONE LACTATE IN DEXTROSE 20-5 MG/100ML-% IV SOLN
0.3750 ug/kg/min | INTRAVENOUS | Status: DC
  Administered 2020-09-29 (×2): 0.25 ug/kg/min via INTRAVENOUS
  Administered 2020-09-30 – 2020-10-01 (×3): 0.375 ug/kg/min via INTRAVENOUS
  Filled 2020-09-29 (×7): qty 100

## 2020-09-29 MED ORDER — POTASSIUM CHLORIDE CRYS ER 20 MEQ PO TBCR
40.0000 meq | EXTENDED_RELEASE_TABLET | Freq: Once | ORAL | Status: AC
  Administered 2020-09-29: 40 meq via ORAL
  Filled 2020-09-29: qty 2

## 2020-09-29 MED ORDER — AMIODARONE HCL 200 MG PO TABS
200.0000 mg | ORAL_TABLET | Freq: Two times a day (BID) | ORAL | Status: DC
  Administered 2020-09-29 – 2020-10-03 (×9): 200 mg via ORAL
  Filled 2020-09-29 (×9): qty 1

## 2020-09-29 NOTE — Progress Notes (Signed)
Echocardiogram 2D Echocardiogram has been performed.  Marcus Johnson 09/29/2020, 10:00 AM

## 2020-09-29 NOTE — Progress Notes (Signed)
Patient ID: Marcus Barberi., male   DOB: [birth year], 60 y.o.   MRN: [id number]      Advanced Heart Failure Rounding Note  PCP-Cardiologist: No primary care provider on file.   Subjective:    Co-ox 44%, started on milrinone 0.25.  UOP improving on Lasix gtt at 12 mg/hr, CVP 18 on my read today.   In NSR with very frequent PACs.   Creatinine mildly higher at 1.7.   Nausea/vomiting resolved for now.   Echo: EF 20-25%, normal RV, PASP 61, dilated IVC.    Objective:   Weight Range: 97.5 kg Body mass index is 30.4 kg/m.   Vital Signs:   Temp:  [97.6 F (36.4 C)-98.8 F (37.1 C)] 97.8 F (36.6 C) (02/24 0826) Pulse Rate:  [85-106] 97 (02/24 0958) Resp:  [18-20] 20 (02/24 0826) BP: (96-113)/(66-80) 104/71 (02/24 0958) SpO2:  [100 %] 100 % (02/24 0826) Weight:  [97.5 kg] 97.5 kg (02/24 0400) Last BM Date: 09/28/20  Weight change: Filed Weights   09/29/20 0400  Weight: 97.5 kg    Intake/Output:   Intake/Output Summary (Last 24 hours) at 09/29/2020 1204 Last data filed at 09/29/2020 1149 Gross per 24 hour  Intake 1071.33 ml  Output 2241 ml  Net -1169.67 ml      Physical Exam    General:  Well appearing. No resp difficulty HEENT: Normal Neck: Supple. JVP 16+. Carotids 2+ bilat; no bruits. No lymphadenopathy or thyromegaly appreciated. Cor: PMI nondisplaced. Irregular rate & rhythm. +S3.  Lungs: Clear Abdomen: Soft, nontender, nondistended. No hepatosplenomegaly. No bruits or masses. Good bowel sounds. Extremities: No cyanosis, clubbing, rash. 1+ edema to knees.  Neuro: Alert & orientedx3, cranial nerves grossly intact. moves all 4 extremities w/o difficulty. Affect pleasant   Telemetry   NSR with frequent PACs (personally reviewed).   Labs    CBC No results for input(s): WBC, NEUTROABS, HGB, HCT, MCV, PLT in the last 72 hours. Basic Metabolic Panel Recent Labs    09/28/20 0930 09/29/20 0447  NA 139 139  K 4.8 3.6  CL 104 104  CO2 24 25   GLUCOSE 163* 107*  BUN 49* 49*  CREATININE 1.53* 1.70*  CALCIUM 9.2 9.1   Liver Function Tests Recent Labs    09/28/20 0930  AST 52*  ALT 26  ALKPHOS 302*  BILITOT 2.3*  PROT 7.6  ALBUMIN 3.1*   No results for input(s): LIPASE, AMYLASE in the last 72 hours. Cardiac Enzymes No results for input(s): CKTOTAL, CKMB, CKMBINDEX, TROPONINI in the last 72 hours.  BNP: BNP (last 3 results) No results for input(s): BNP in the last 8760 hours.  ProBNP (last 3 results) No results for input(s): PROBNP in the last 8760 hours.   D-Dimer No results for input(s): DDIMER in the last 72 hours. Hemoglobin A1C Recent Labs    09/29/20 0003  HGBA1C 7.7*   Fasting Lipid Panel No results for input(s): CHOL, HDL, LDLCALC, TRIG, CHOLHDL, LDLDIRECT in the last 72 hours. Thyroid Function Tests No results for input(s): TSH, T4TOTAL, T3FREE, THYROIDAB in the last 72 hours.  Invalid input(s): FREET3  Other results:   Imaging    DG CHEST PORT 1 VIEW  Result Date: 09/28/2020 CLINICAL DATA:  PICC line EXAM: PORTABLE CHEST 1 VIEW COMPARISON:  08/29/2020 FINDINGS: Post sternotomy changes. Left-sided pacing device as before. Right upper extremity central venous catheter tip overlies the SVC. There is cardiomegaly but no edema, pleural effusion or focal airspace disease. IMPRESSION: Right upper extremity central  venous catheter tip overlies the SVC. Cardiomegaly. Electronically Signed   By: Donavan Foil M.D.   On: 09/28/2020 22:31   ECHOCARDIOGRAM COMPLETE  Result Date: 09/29/2020    ECHOCARDIOGRAM REPORT   Patient Name:   Aadvik Roker. Date of Exam: 09/29/2020 Medical Rec #:  [id number]                Height:       70.5 in Accession #:    [id number]               Weight:       214.9 lb Date of Birth:  1961/04/18                BSA:          2.163 m Patient Age:    58 years                 BP:           100/75 mmHg Patient Gender: M                        HR:           89 bpm. Exam  Location:  Inpatient Procedure: 2D Echo, Color Doppler and Cardiac Doppler Indications:    I50.9* Heart failure (unspecified)  History:        Patient has prior history of Echocardiogram examinations, most                 recent 11/25/2019. Defibrillator, Arrythmias:Atrial Fibrillation;                 Risk Factors:Hypertension, Diabetes, Dyslipidemia and COVID+                 08/29/20.  Sonographer:    Raquel Sarna Senior RDCS Referring Phys: [id number] AMY D CLEGG IMPRESSIONS  1. There is preservation of contraction of the base to mid lateral wall. Left ventricular ejection fraction, by estimation, is 20 to 25%. The left ventricle has severely decreased function. The left ventricle demonstrates global hypokinesis. Left ventricular diastolic parameters are indeterminate.  2. Right ventricular systolic function is normal. The right ventricular size is normal. There is severely elevated pulmonary artery systolic pressure.  3. Left atrial size was mildly dilated.  4. The mitral valve is normal in structure. Mild mitral valve regurgitation. No evidence of mitral stenosis.  5. Tricuspid valve regurgitation is severe.  6. The aortic valve is normal in structure. Aortic valve regurgitation is not visualized. No aortic stenosis is present.  7. The inferior vena cava is dilated in size with <50% respiratory variability, suggesting right atrial pressure of 15 mmHg. Comparison(s): No significant change from prior study. Prior images reviewed side by side. FINDINGS  Left Ventricle: There is preservation of contraction of the base to mid lateral wall. Left ventricular ejection fraction, by estimation, is 20 to 25%. The left ventricle has severely decreased function. The left ventricle demonstrates global hypokinesis. The left ventricular internal cavity size was normal in size. There is no left ventricular hypertrophy. Left ventricular diastolic parameters are indeterminate. Right Ventricle: The right ventricular size is normal. No  increase in right ventricular wall thickness. Right ventricular systolic function is normal. There is severely elevated pulmonary artery systolic pressure. The tricuspid regurgitant velocity is 3.39 m/s, and with an assumed right atrial pressure of 15 mmHg, the estimated right ventricular systolic pressure is 31.5 mmHg. Left Atrium: Left atrial size  was mildly dilated. Right Atrium: Right atrial size was normal in size. Pericardium: There is no evidence of pericardial effusion. Mitral Valve: The mitral valve is normal in structure. Mild mitral valve regurgitation. No evidence of mitral valve stenosis. Tricuspid Valve: The tricuspid valve is normal in structure. Tricuspid valve regurgitation is severe. No evidence of tricuspid stenosis. Aortic Valve: The aortic valve is normal in structure. Aortic valve regurgitation is not visualized. No aortic stenosis is present. Pulmonic Valve: The pulmonic valve was normal in structure. Pulmonic valve regurgitation is trivial. No evidence of pulmonic stenosis. Aorta: The aortic root is normal in size and structure. Venous: The inferior vena cava is dilated in size with less than 50% respiratory variability, suggesting right atrial pressure of 15 mmHg. IAS/Shunts: No atrial level shunt detected by color flow Doppler. Additional Comments: A pacer wire is visualized.  LEFT VENTRICLE PLAX 2D LVIDd:         5.60 cm      Diastology LVIDs:         4.40 cm      LV e' medial:    2.62 cm/s LV PW:         1.10 cm      LV E/e' medial:  34.7 LV IVS:        0.90 cm      LV e' lateral:   6.62 cm/s LVOT diam:     1.90 cm      LV E/e' lateral: 13.7 LV SV:         28 LV SV Index:   13 LVOT Area:     2.84 cm  LV Volumes (MOD) LV vol d, MOD A2C: 193.0 ml LV vol d, MOD A4C: 135.0 ml LV vol s, MOD A2C: 161.0 ml LV vol s, MOD A4C: 105.0 ml LV SV MOD A2C:     32.0 ml LV SV MOD A4C:     135.0 ml LV SV MOD BP:      35.9 ml RIGHT VENTRICLE RV S prime:     6.18 cm/s TAPSE (M-mode): 1.1 cm LEFT ATRIUM              Index       RIGHT ATRIUM           Index LA diam:        4.40 cm 2.03 cm/m  RA Area:     18.30 cm LA Vol (A2C):   67.6 ml 31.25 ml/m RA Volume:   54.50 ml  25.19 ml/m LA Vol (A4C):   68.2 ml 31.52 ml/m LA Biplane Vol: 69.7 ml 32.22 ml/m  AORTIC VALVE LVOT Vmax:   59.20 cm/s LVOT Vmean:  37.600 cm/s LVOT VTI:    0.098 m  AORTA Ao Root diam: 2.90 cm Ao Asc diam:  3.00 cm MITRAL VALVE               TRICUSPID VALVE MV Area (PHT): 5.38 cm    TR Peak grad:   46.0 mmHg MV Decel Time: 141 msec    TR Vmax:        339.00 cm/s MV E velocity: 90.80 cm/s MV A velocity: 25.70 cm/s  SHUNTS MV E/A ratio:  3.53        Systemic VTI:  0.10 m                            Systemic Diam: 1.90 cm Candee Furbish MD Electronically signed by  Candee Furbish MD Signature Date/Time: 09/29/2020/11:25:13 AM    Final    [place] EKG SITE RITE  Result Date: 09/28/2020 If [facility] image not attached, placement could not be confirmed due to current cardiac rhythm.     Medications:     Scheduled Medications: . amiodarone  200 mg Oral BID  . apixaban  5 mg Oral BID  . carvedilol  3.125 mg Oral BID WC  . Chlorhexidine Gluconate Cloth  6 each Topical Daily  . digoxin  0.125 mg Oral Daily  . empagliflozin  10 mg Oral Daily  . insulin aspart  0-15 Units Subcutaneous TID WC  . metolazone  2.5 mg Oral Once  . potassium chloride  40 mEq Oral Once  . rosuvastatin  40 mg Oral Daily  . sodium chloride flush  10-40 mL Intracatheter Q12H  . spironolactone  12.5 mg Oral Daily     Infusions: . sodium chloride Stopped (09/28/20 1746)  . furosemide (LASIX) 200 mg in dextrose 5% 100 mL (2mg /mL) infusion 12 mg/hr (09/28/20 1900)  . milrinone 0.25 mcg/kg/min (09/29/20 0836)     PRN Medications:  sodium chloride, acetaminophen, ondansetron (ZOFRAN) IV, ondansetron, sodium chloride flush    Assessment/Plan   1. COVID-19 infection:in 1/22. Manifested primarily as nausea/vomiting. Very slow recovery, did not take meds for at  least 3 wks but had recently started back on meds just prior to admission.  Nausea seems better today.  2.Acute on chronic systolic HF:ICM.HasMedtronic ICD. Narrow QRS, not CRT candidate. Has required milrinone multiple times in the past for low output HF. Echo 6/19 with EF 20% and moderately decreased RV systolic function.CPX 4/19 with severe functional limitation due to [facility] 6/19 showed low output and coronary angiography showed no interventional targets.Echo in 4/21 showed EF 25%, mildly decreased RV systolic function. CPX in 5/21 showed severe HF limitation. He has been very resistant to LVAD/transplant evaluation, both offered with multiple discussions in past. I worry that we will miss the window for advanced therapies. He was not interested in barostimulatoractivation therapy. History of cardiorenal syndrome/diabetic nephropathy, creatinine up to 2.33 in 1/22, likely due to dehydration with nausea/vomiting.He had been off meds for several weeks, just restarted 3-4 days PTA. He is markedly volume overloaded by exam and Optivol with NYHA class IIIb symptoms.Admitted for diuresis, started on Lasix gtt at 12 mg/hr and with co-ox 44%, was started on milrinone 0.25.  Starting to diurese, CVP remains 18. - milrinone 0.25, follow co-ox.  - Lasix 12 mg/hr, will give a dose of metolazone 2.5 x 1 today and replace K.  -Continue digoxin 0.125 daily (level ok).  - Continue Coreg 3.125 mg bid - Continue spironolactone 12.5 daily.  - Continue SGLT2 inhibitor.  - Hold Entresto for now, BP soft.  - Has refused LVAD in past, with recurrent low output HF will need to discuss again.  2. CAD s/p CABG2002:Last cath in 6/19 with patent grafts. No chest pain.  -No ASA given apixaban use.  - Continue Crestor 40 mg daily.  3. CKD Stage 3: I suspect that this is a combination of cardiorenal syndrome and diabetic nephropathy. Creatinine mildly higher today at 1.7.  Hopefully will stabilize  with milrinone.  4. ZO:XWRUEAV of very poor control but hgbA1ctrending down.  - Cover with SSI. 5. Atrial flutter: s/p DC-CV 08/21/18.He is in NSR currently but note runs of ?flutter vs fibrillation on device interrogation. Very frequent PACs on telemetry.  - With milrinone initiation, will also start amiodarone 200 mg  bid.  - Continue Eliquis.  Length of Stay: 1  Loralie Champagne, MD  09/29/2020, 12:04 PM  Advanced Heart Failure Team Pager [phone] (M-F; 7a - 4p)  Please contact [facility] for night-coverage after hours (4p -7a ) and weekends on [email or web address]

## 2020-09-30 ENCOUNTER — Inpatient Hospital Stay (HOSPITAL_COMMUNITY): Payer: Medicare HMO

## 2020-09-30 ENCOUNTER — Encounter (HOSPITAL_COMMUNITY): Payer: Medicare HMO

## 2020-09-30 ENCOUNTER — Other Ambulatory Visit (HOSPITAL_COMMUNITY): Payer: Medicare HMO

## 2020-09-30 DIAGNOSIS — Z452 Encounter for adjustment and management of vascular access device: Secondary | ICD-10-CM | POA: Diagnosis not present

## 2020-09-30 DIAGNOSIS — I2510 Atherosclerotic heart disease of native coronary artery without angina pectoris: Secondary | ICD-10-CM | POA: Diagnosis not present

## 2020-09-30 DIAGNOSIS — N2889 Other specified disorders of kidney and ureter: Secondary | ICD-10-CM | POA: Diagnosis not present

## 2020-09-30 DIAGNOSIS — K029 Dental caries, unspecified: Secondary | ICD-10-CM | POA: Diagnosis not present

## 2020-09-30 DIAGNOSIS — N62 Hypertrophy of breast: Secondary | ICD-10-CM | POA: Diagnosis not present

## 2020-09-30 DIAGNOSIS — I5023 Acute on chronic systolic (congestive) heart failure: Secondary | ICD-10-CM | POA: Diagnosis not present

## 2020-09-30 DIAGNOSIS — N400 Enlarged prostate without lower urinary tract symptoms: Secondary | ICD-10-CM | POA: Diagnosis not present

## 2020-09-30 DIAGNOSIS — I700 Atherosclerosis of aorta: Secondary | ICD-10-CM | POA: Diagnosis not present

## 2020-09-30 LAB — BASIC METABOLIC PANEL
Anion gap: 11 (ref 5–15)
BUN: 52 mg/dL — ABNORMAL HIGH (ref 6–20)
CO2: 29 mmol/L (ref 22–32)
Calcium: 9.3 mg/dL (ref 8.9–10.3)
Chloride: 98 mmol/L (ref 98–111)
Creatinine, Ser: 1.84 mg/dL — ABNORMAL HIGH (ref 0.61–1.24)
GFR, Estimated: 42 mL/min — ABNORMAL LOW (ref >60)
Glucose, Bld: 165 mg/dL — ABNORMAL HIGH (ref 70–99)
Potassium: 3.5 mmol/L (ref 3.5–5.1)
Sodium: 138 mmol/L (ref 135–145)

## 2020-09-30 LAB — RAPID URINE DRUG SCREEN, HOSP PERFORMED
Amphetamines: NOT DETECTED
Barbiturates: NOT DETECTED
Benzodiazepines: NOT DETECTED
Cocaine: NOT DETECTED
Opiates: NOT DETECTED
Tetrahydrocannabinol: NOT DETECTED

## 2020-09-30 LAB — URINALYSIS, ROUTINE W REFLEX MICROSCOPIC
Bacteria, UA: NONE SEEN
Bilirubin Urine: NEGATIVE
Glucose, UA: >=500 mg/dL — AB
Ketones, ur: NEGATIVE mg/dL
Leukocytes,Ua: NEGATIVE
Nitrite: NEGATIVE
Protein, ur: NEGATIVE mg/dL
Specific Gravity, Urine: 1.005 (ref 1.005–1.030)
pH: 6 (ref 5.0–8.0)

## 2020-09-30 LAB — COOXEMETRY PANEL
Carboxyhemoglobin: 1.7 % — ABNORMAL HIGH (ref 0.5–1.5)
Carboxyhemoglobin: 1.8 % — ABNORMAL HIGH (ref 0.5–1.5)
Carboxyhemoglobin: 1.7 % — ABNORMAL HIGH (ref 0.5–1.5)
Methemoglobin: 0.9 % (ref 0.0–1.5)
Methemoglobin: 0.7 % (ref 0.0–1.5)
Methemoglobin: 0.8 % (ref 0.0–1.5)
O2 Saturation: 45.1 %
O2 Saturation: 45.7 %
O2 Saturation: 45.4 %
Total hemoglobin: 11.8 g/dL — ABNORMAL LOW (ref 12.0–16.0)
Total hemoglobin: 11.1 g/dL — ABNORMAL LOW (ref 12.0–16.0)
Total hemoglobin: 12.5 g/dL (ref 12.0–16.0)

## 2020-09-30 LAB — CBC WITH DIFFERENTIAL/PLATELET
Abs Immature Granulocytes: 0.03 10*3/uL (ref 0.00–0.07)
Basophils Absolute: 0 10*3/uL (ref 0.0–0.1)
Basophils Relative: 0 %
Eosinophils Absolute: 0.2 10*3/uL (ref 0.0–0.5)
Eosinophils Relative: 2 %
HCT: 38.6 % — ABNORMAL LOW (ref 39.0–52.0)
Hemoglobin: 11.6 g/dL — ABNORMAL LOW (ref 13.0–17.0)
Immature Granulocytes: 0 %
Lymphocytes Relative: 27 %
Lymphs Abs: 2.2 10*3/uL (ref 0.7–4.0)
MCH: 24.3 pg — ABNORMAL LOW (ref 26.0–34.0)
MCHC: 30.1 g/dL (ref 30.0–36.0)
MCV: 80.9 fL (ref 80.0–100.0)
Monocytes Absolute: 0.9 10*3/uL (ref 0.1–1.0)
Monocytes Relative: 11 %
Neutro Abs: 4.9 10*3/uL (ref 1.7–7.7)
Neutrophils Relative %: 60 %
Platelets: 174 10*3/uL (ref 150–400)
RBC: 4.77 MIL/uL (ref 4.22–5.81)
RDW: 21.2 % — ABNORMAL HIGH (ref 11.5–15.5)
WBC: 8.1 10*3/uL (ref 4.0–10.5)
nRBC: 0 % (ref 0.0–0.2)

## 2020-09-30 LAB — TYPE AND SCREEN
ABO/RH(D): A NEG
Antibody Screen: NEGATIVE

## 2020-09-30 LAB — GLUCOSE, CAPILLARY
Glucose-Capillary: 118 mg/dL — ABNORMAL HIGH (ref 70–99)
Glucose-Capillary: 177 mg/dL — ABNORMAL HIGH (ref 70–99)
Glucose-Capillary: 180 mg/dL — ABNORMAL HIGH (ref 70–99)
Glucose-Capillary: 224 mg/dL — ABNORMAL HIGH (ref 70–99)

## 2020-09-30 LAB — CBC
HCT: 37.8 % — ABNORMAL LOW (ref 39.0–52.0)
Hemoglobin: 11.1 g/dL — ABNORMAL LOW (ref 13.0–17.0)
MCH: 24 pg — ABNORMAL LOW (ref 26.0–34.0)
MCHC: 29.4 g/dL — ABNORMAL LOW (ref 30.0–36.0)
MCV: 81.8 fL (ref 80.0–100.0)
Platelets: 158 10*3/uL (ref 150–400)
RBC: 4.62 MIL/uL (ref 4.22–5.81)
RDW: 21.4 % — ABNORMAL HIGH (ref 11.5–15.5)
WBC: 7.5 10*3/uL (ref 4.0–10.5)
nRBC: 0 % (ref 0.0–0.2)

## 2020-09-30 LAB — PREALBUMIN: Prealbumin: 19.1 mg/dL (ref 18–38)

## 2020-09-30 LAB — LIPID PANEL
Cholesterol: 103 mg/dL (ref 0–200)
HDL: 29 mg/dL — ABNORMAL LOW (ref >40)
LDL Cholesterol: 59 mg/dL (ref 0–99)
Total CHOL/HDL Ratio: 3.6 RATIO
Triglycerides: 76 mg/dL (ref <150)
VLDL: 15 mg/dL (ref 0–40)

## 2020-09-30 LAB — ANTITHROMBIN III: AntiThromb III Func: 107 % (ref 75–120)

## 2020-09-30 LAB — T4, FREE: Free T4: 1.7 ng/dL — ABNORMAL HIGH (ref 0.61–1.12)

## 2020-09-30 LAB — MAGNESIUM: Magnesium: 2.2 mg/dL (ref 1.7–2.4)

## 2020-09-30 LAB — LACTATE DEHYDROGENASE: LDH: 184 U/L (ref 98–192)

## 2020-09-30 LAB — HEPATITIS B SURFACE ANTIGEN: Hepatitis B Surface Ag: NONREACTIVE

## 2020-09-30 LAB — PSA: Prostatic Specific Antigen: 24.94 ng/mL — ABNORMAL HIGH (ref 0.00–4.00)

## 2020-09-30 LAB — URIC ACID: Uric Acid, Serum: 9.3 mg/dL — ABNORMAL HIGH (ref 3.7–8.6)

## 2020-09-30 MED ORDER — METOLAZONE 2.5 MG PO TABS
2.5000 mg | ORAL_TABLET | Freq: Once | ORAL | Status: AC
  Administered 2020-09-30: 2.5 mg via ORAL
  Filled 2020-09-30: qty 1

## 2020-09-30 MED ORDER — POTASSIUM CHLORIDE CRYS ER 20 MEQ PO TBCR
40.0000 meq | EXTENDED_RELEASE_TABLET | Freq: Once | ORAL | Status: AC
  Administered 2020-09-30: 40 meq via ORAL
  Filled 2020-09-30: qty 2

## 2020-09-30 MED ORDER — ADULT MULTIVITAMIN W/MINERALS CH
1.0000 | ORAL_TABLET | Freq: Every day | ORAL | Status: DC
  Administered 2020-09-30 – 2020-10-12 (×13): 1 via ORAL
  Filled 2020-09-30 (×13): qty 1

## 2020-09-30 NOTE — Progress Notes (Signed)
Patient ID: Marcus Horger., male   DOB: 04-20-1961, 60 y.o.   MRN: [id number]     Advanced Heart Failure Rounding Note  PCP-Cardiologist: No primary care provider on file.   Subjective:    Co-ox 45%, on milrinone 0.25.  Good UOP, I/Os net negative -3478 and weight down ?9 lbs.   In NSR with very frequent PACs and PVCs it appears.   Creatinine mildly higher at 1.84.   Nausea/vomiting resolved with diuresis.   Echo: EF 20-25%, normal RV, PASP 61, dilated IVC.    Objective:   Weight Range: 93.2 kg Body mass index is 29.07 kg/m.   Vital Signs:   Temp:  [97.8 F (36.6 C)-98.6 F (37 C)] 98.1 F (36.7 C) (02/25 0752) Pulse Rate:  [90-97] 96 (02/25 0949) Resp:  [17-20] 17 (02/25 0752) BP: (98-126)/(62-81) 98/65 (02/25 0949) SpO2:  [100 %] 100 % (02/25 0751) Weight:  [93.2 kg] 93.2 kg (02/25 0412) Last BM Date: 09/29/20  Weight change: Filed Weights   09/29/20 0400 09/30/20 0412  Weight: 97.5 kg 93.2 kg    Intake/Output:   Intake/Output Summary (Last 24 hours) at 09/30/2020 1045 Last data filed at 09/30/2020 4128 Gross per 24 hour  Intake 1033.79 ml  Output 5405 ml  Net -4371.21 ml      Physical Exam    General: NAD Neck: JVP 14 cm, no thyromegaly or thyroid nodule.  Lungs: Clear to auscultation bilaterally with normal respiratory effort. CV: Lateral PMI.  Heart regular S1/S2, no S3/S4, no murmur.  Trace ankle edema.  Abdomen: Soft, nontender, no hepatosplenomegaly, no distention.  Skin: Intact without lesions or rashes.  Neurologic: Alert and oriented x 3.  Psych: Normal affect. Extremities: No clubbing or cyanosis.  HEENT: Normal.    Telemetry   NSR with frequent PACs/PVCs (personally reviewed).   Labs    CBC Recent Labs    09/30/20 0440  WBC 7.5  HGB 11.1*  HCT 37.8*  MCV 81.8  PLT 786   Basic Metabolic Panel Recent Labs    09/29/20 0447 09/30/20 0440  NA 139 138  K 3.6 3.5  CL 104 98  CO2 25 29  GLUCOSE 107* 165*  BUN  49* 52*  CREATININE 1.70* 1.84*  CALCIUM 9.1 9.3  MG  --  2.2   Liver Function Tests Recent Labs    09/28/20 0930  AST 52*  ALT 26  ALKPHOS 302*  BILITOT 2.3*  PROT 7.6  ALBUMIN 3.1*   No results for input(s): LIPASE, AMYLASE in the last 72 hours. Cardiac Enzymes No results for input(s): CKTOTAL, CKMB, CKMBINDEX, TROPONINI in the last 72 hours.  BNP: BNP (last 3 results) No results for input(s): BNP in the last 8760 hours.  ProBNP (last 3 results) No results for input(s): PROBNP in the last 8760 hours.   D-Dimer No results for input(s): DDIMER in the last 72 hours. Hemoglobin A1C Recent Labs    09/29/20 0003  HGBA1C 7.7*   Fasting Lipid Panel No results for input(s): CHOL, HDL, LDLCALC, TRIG, CHOLHDL, LDLDIRECT in the last 72 hours. Thyroid Function Tests No results for input(s): TSH, T4TOTAL, T3FREE, THYROIDAB in the last 72 hours.  Invalid input(s): FREET3  Other results:   Imaging    No results found.   Medications:     Scheduled Medications: . amiodarone  200 mg Oral BID  . apixaban  5 mg Oral BID  . carvedilol  3.125 mg Oral BID WC  . Chlorhexidine Gluconate Cloth  6 each Topical Daily  . digoxin  0.125 mg Oral Daily  . empagliflozin  10 mg Oral Daily  . insulin aspart  0-15 Units Subcutaneous TID WC  . metolazone  2.5 mg Oral Once  . potassium chloride  40 mEq Oral Once  . rosuvastatin  40 mg Oral Daily  . sodium chloride flush  10-40 mL Intracatheter Q12H  . spironolactone  12.5 mg Oral Daily    Infusions: . sodium chloride Stopped (09/28/20 1746)  . furosemide (LASIX) 200 mg in dextrose 5% 100 mL (2mg /mL) infusion 12 mg/hr (09/30/20 0612)  . milrinone 0.375 mcg/kg/min (09/30/20 0808)    PRN Medications: sodium chloride, acetaminophen, ondansetron (ZOFRAN) IV, ondansetron, sodium chloride flush    Assessment/Plan   1. COVID-19 infection:in 1/22. Manifested primarily as nausea/vomiting. Very slow recovery, did not take meds  for at least 3 wks but had recently started back on meds just prior to admission.  Nausea seems better today.  - Suspect that nausea/poor appetite was actually due to HF.  2.Acute on chronic systolic HF:ICM.HasMedtronic ICD. Narrow QRS, not CRT candidate. Has required milrinone multiple times in the past for low output HF. Echo 6/19 with EF 20% and moderately decreased RV systolic function.CPX 4/19 with severe functional limitation due to [facility] 6/19 showed low output and coronary angiography showed no interventional targets.Echo in 4/21 showed EF 25%, mildly decreased RV systolic function. CPX in 5/21 showed severe HF limitation. He has been resistant to LVAD/transplant evaluation, both offered with discussions in past. I worry that we will miss the window for advanced therapies. He was not interested in barostimulatoractivation therapy. History of cardiorenal syndrome/diabetic nephropathy, creatinine up to 2.33 in 1/22, likely due to dehydration with nausea/vomiting.He had been off meds for several weeks, just restarted 3-4 days PTA. He remains volume overloaded with low output HF. Co-ox 45% today on milrinone 0.25, CVP down to 13-14.  - Increase milrinone to 0.375, check co-ox later this morning.  - Lasix 12 mg/hr, will give a dose of metolazone 2.5 x 1 again today and replace K.  -Continue digoxin 0.125 daily (level ok).  - Continue Coreg 3.125 mg bid (lower dose).  - Continue spironolactone 12.5 daily.  - Continue empagliflozin.  - Hold Entresto for now, BP soft.  - We discussed LVAD again today.  He has low output HF and suspect that he is going to be inotrope dependent. He is more open to LVAD discussion though he is not sure yet he would want it.  Wants to talk with LVAD coordinators and with his family.  I do not think that he would be willing to be seen for transplant evaluation, and I think he has a very narrow window at this point for any advanced therapy with rising  creatinine.  If he is interested in LVAD and co-ox/renal function do not improve with increasing milrinone, Impella 5.5 would be consideration.  2. CAD s/p CABG2002:Last cath in 6/19 with patent grafts. No chest pain.  -No ASA given apixaban use.  - Continue Crestor 40 mg daily.  3. CKD Stage 3: I suspect that this is a combination of cardiorenal syndrome and diabetic nephropathy. Creatinine mildly higher today at 1.8.  Hopefully will stabilize with increased milrinone.  4. OI:BBCWUGQ of very poor control but hgbA1ctrending down.  - Cover with SSI. 5. Atrial flutter: s/p DC-CV 08/21/18.He is in NSR currently but note runs of ?flutter vs fibrillation on device interrogation. Very frequent PACs on telemetry.  - Continue amiodarone 200  mg bid.   - Continue Eliquis. - Confirm rhythm with ECG.   Length of Stay: 2  Loralie Champagne, MD  09/30/2020, 10:45 AM  Advanced Heart Failure Team Pager [phone] (M-F; 7a - 4p)  Please contact [facility] for night-coverage after hours (4p -7a ) and weekends on [email or web address]

## 2020-09-30 NOTE — Progress Notes (Signed)
Initial Nutrition Assessment  DOCUMENTATION CODES:   Not applicable  INTERVENTION:   -MVI with minerals daily -Reviewed low sodium diet  NUTRITION DIAGNOSIS:   Increased nutrient needs related to chronic illness (CHF) as evidenced by estimated needs.  GOAL:   Patient will meet greater than or equal to 90% of their needs  MONITOR:   PO intake,Supplement acceptance,Labs,Weight trends,Skin,I & O's  REASON FOR ASSESSMENT:   Consult LVAD Eval  ASSESSMENT:   Marcus Johnson. is a 60 y.o. male who has a h/o DM2, CAD s/p CABG 9604, systolic HF due to ischemic cardiomyopathy with EF 20-25% (echo 12/15), DM2 and CKD. He is s/p Medtronic single chamber ICD.  Pt admitted with CHF.   2/23- PICC placed  Reviewed I/O's: -3.5 L x 24 hours and -3.6 L since admission  UOP: 4.6 L x 24 hours  Per Advanced Heart Failure Team notes, pt will be inotrope dependent at discharge.   Spoke with pt at bedside, who was pleasant and in good spirits at time of visit. He shares he had a decreased appetite for 2-3 weeks PTA secondary to COVID diagnosis. Per pt, he was cosuming mostly 4 Boost supplements per day and 2 glasses of pedialyte, as these were the only things he could get down. Since being admitted to this hospital, his appetite has improved greatly and he has been consuming 100% of his meals.   Pt shares that prior to COVID diagnosis, he felt he did well with manageing his heart failure. He typically consumed 3 meals per day. Pt reports meals were prepared by him, his brother, or son; all family members are vigilant about low sodium guidelines.   Pt was weighing himself daily. Prior to acute illness, UBW is around 195-200#. He expresses frustration over fluid overload and hospitalizations, however, is also apprehensive about undergoing LVAD. RD actively listened to pt concerns and commended him for receiving education from his providers, which will help him make the best decision for  his situation. RD reinforced compliance of low sodium diet for heart failure management, which will be crucial whether or not pt decides to pursue LVAD. Also discussed importance of good meal/ nutritional intake, especially if he is preparing for surgery. Pt declines supplements at this time, as he feels he is eating well enough to meet his needs, but will reconsider them if his intake declines. He has not further questions for RD at this time, but expressed appreciation for visit.    Lab Results  Component Value Date   HGBA1C 7.7 (H) 09/29/2020   PTA DM medications are 10 units jardiance daily, 5 units insulin aspart TID with meals, and 50 units insulin glargine daily.   Labs reviewed: CBGS: 118-177 (inpatient orders for glycemic control are 10 mg jardiance daily and 0-15 units insulin aspart TID with meas).   NUTRITION - FOCUSED PHYSICAL EXAM:  Flowsheet Row Most Recent Value  Orbital Region No depletion  Upper Arm Region No depletion  Thoracic and Lumbar Region No depletion  Buccal Region No depletion  Temple Region No depletion  Clavicle Bone Region No depletion  Clavicle and Acromion Bone Region No depletion  Scapular Bone Region No depletion  Dorsal Hand No depletion  Patellar Region No depletion  Anterior Thigh Region No depletion  Posterior Calf Region No depletion  Edema (RD Assessment) Mild  Hair Reviewed  Eyes Reviewed  Mouth Reviewed  Skin Reviewed  Nails Reviewed       Diet Order:   Diet Order  Diet 2 gram sodium Room service appropriate? Yes; Fluid consistency: Thin  Diet effective now                 EDUCATION NEEDS:   Education needs have been addressed  Skin:  Skin Assessment: Reviewed RN Assessment  Last BM:  09/29/20  Height:   Ht Readings from Last 1 Encounters:  07/04/20 5' 10.5" (1.791 m)    Weight:   Wt Readings from Last 1 Encounters:  09/30/20 93.2 kg    Ideal Body Weight:  76.8 kg  BMI:  Body mass index is 29.07  kg/m.  Estimated Nutritional Needs:   Kcal:  2100-2300  Protein:  100-115 grams  Fluid:  2 L   Loistine Chance, RD, LDN, [place] Registered Dietitian II Certified Diabetes Care and Education Specialist Please refer to [facility] for RD and/or RD on-call/weekend/after hours pager

## 2020-09-30 NOTE — Progress Notes (Signed)
Pt is not sure he wants to have the LVAD procedure, and refused the ABG at this time.

## 2020-10-01 DIAGNOSIS — Z7189 Other specified counseling: Secondary | ICD-10-CM | POA: Diagnosis not present

## 2020-10-01 DIAGNOSIS — Z66 Do not resuscitate: Secondary | ICD-10-CM | POA: Diagnosis not present

## 2020-10-01 DIAGNOSIS — Z515 Encounter for palliative care: Secondary | ICD-10-CM

## 2020-10-01 DIAGNOSIS — I5023 Acute on chronic systolic (congestive) heart failure: Secondary | ICD-10-CM | POA: Diagnosis not present

## 2020-10-01 LAB — GLUCOSE, CAPILLARY
Glucose-Capillary: 165 mg/dL — ABNORMAL HIGH (ref 70–99)
Glucose-Capillary: 227 mg/dL — ABNORMAL HIGH (ref 70–99)
Glucose-Capillary: 200 mg/dL — ABNORMAL HIGH (ref 70–99)
Glucose-Capillary: 225 mg/dL — ABNORMAL HIGH (ref 70–99)

## 2020-10-01 LAB — COOXEMETRY PANEL
Carboxyhemoglobin: 2 % — ABNORMAL HIGH (ref 0.5–1.5)
Methemoglobin: 0.8 % (ref 0.0–1.5)
O2 Saturation: 56.5 %
Total hemoglobin: 12.2 g/dL (ref 12.0–16.0)

## 2020-10-01 LAB — CBC
HCT: 39.1 % (ref 39.0–52.0)
Hemoglobin: 12 g/dL — ABNORMAL LOW (ref 13.0–17.0)
MCH: 24.6 pg — ABNORMAL LOW (ref 26.0–34.0)
MCHC: 30.7 g/dL (ref 30.0–36.0)
MCV: 80.3 fL (ref 80.0–100.0)
Platelets: 182 10*3/uL (ref 150–400)
RBC: 4.87 MIL/uL (ref 4.22–5.81)
RDW: 21 % — ABNORMAL HIGH (ref 11.5–15.5)
WBC: 7.4 10*3/uL (ref 4.0–10.5)
nRBC: 0 % (ref 0.0–0.2)

## 2020-10-01 LAB — BASIC METABOLIC PANEL
Anion gap: 14 (ref 5–15)
BUN: 53 mg/dL — ABNORMAL HIGH (ref 6–20)
CO2: 31 mmol/L (ref 22–32)
Calcium: 9.9 mg/dL (ref 8.9–10.3)
Chloride: 92 mmol/L — ABNORMAL LOW (ref 98–111)
Creatinine, Ser: 2.1 mg/dL — ABNORMAL HIGH (ref 0.61–1.24)
GFR, Estimated: 36 mL/min — ABNORMAL LOW (ref >60)
Glucose, Bld: 196 mg/dL — ABNORMAL HIGH (ref 70–99)
Potassium: 3.4 mmol/L — ABNORMAL LOW (ref 3.5–5.1)
Sodium: 137 mmol/L (ref 135–145)

## 2020-10-01 NOTE — Progress Notes (Addendum)
Palliative Medicine Inpatient Follow Up Note   I was asked to stop by [facility] room this evening by the unit charge RN, Marcus Johnson as he has now decided to reverse his code status. I went in to meet with Marcus Johnson, he was very upset during this time. I asked him how I could aid his him. He states that he has spoken with his son in regards to his code status and he has decided to change himself back to Full Code. He shared with me that per conversations with his son it would be "unethical" to not offer CPR to him and it is "illegal". I asked him what is unethical or illegal about him making the decision to not pursue invasive and aggressive measures? He shares that his son has made it clear that this will follow him wherever he goes and he no longer wishes to be DNR. I stated that of course we will respect this but I would like to better understand what led to this change after our conversation this morning where he seemed very clear? He said this is just what he wants.   I allowed Marcus Johnson time to express all of his frustrations. He expresses that the medical team is hiding something from him and he has never had to meet Palliative care until now. I shared with him that every patient being considered for an LVAD must have a Palliative care consultation. He said that this was not true as he had never had one before. We reviewed that in 2019 he had also had a Palliative care consult for similar reasons.   Marcus Johnson asked me what was going on and if he is going to die tomorrow, I shared with him that this is by no measure what anyone is saying but in reality we have to prepare him as best we can for what is to come and discuss the best case and worst case outcomes LVAD or not. We reviewed his exceptionally poor cardiac function and that yes, he has survived quite sometime with this but eventually he will have an event which he cannot recover from. I stated that no one is trying to "kill him" by talking about code status  or broaching the topic of hospice. We reviewed that hospice is a possibility in the future if he declines further treatments and that his disease will progress beyond the point of medical interventions likely at some point sooner than later.   Marcus Johnson goes on to share with me that his son was very upset and saddened by him being a DNR and we wants to change this. I asked him if I may speak to his son to discuss this further which he is resistant to due to his son being upset.  I shared with Marcus Johnson that it may be of utility for [place] all to have a meeting together to clarify these points. In the meanwhile I shared that we will reverse his code status back to Full Code.  SUMMARY OF RECOMMENDATIONS   Code status reverted back to Full Code  Discussed with Dr. Aundra Dubin & Dr. Haroldine Laws via secure chat  Time In: 1632 Time Out: 1733 Time Spent: 61 Greater than 50% of the time was spent in counseling and coordination of care ______________________________________________________________________________________ [facility] Team Team Cell Phone: [phone] Please utilize secure chat with additional questions, if there is no response within 30 minutes please call the above phone number  Palliative Medicine Team providers are available by  phone from 7am to 7pm daily and can be reached through the team cell phone.  Should this patient require assistance outside of these hours, please call the patient's attending physician.     

## 2020-10-01 NOTE — Consult Note (Addendum)
Palliative Medicine Inpatient Consult Note  Reason for consult:  LVAD Evaluation  HPI:  Per intake H&P --> Marcus Johnson. is a 60 y.o. male who has a h/o DM2, CAD s/p CABG 2002, systolic HF due to ischemic cardiomyopathy with EF 20-25% (echo 12/15), DM2 and CKD. He is s/p Med[employer]onic single chamber ICD.  Marcus Johnson was seen by the PMT in 2019 at this time there was discussion of his poor cardiac function. Then LVAD was brought up in conversation and he was not at a point where he wanted to entertain this thought nor pursue it.   The Palliative medicine team has been asked to aid in evaluation of Marcus Johnson for LVAD readiness.  Clinical Assessment/Goals of Care:  *Please note that this is a verbal dictation therefore any spelling or grammatical errors are due to the "Dragon Medical One" system interpretation.  I have reviewed medical records including EPIC notes, labs and imaging, received report from bedside RN, assessed the patient.   I met with Marcus Johnson to discuss advanced directives and a preparedness plan regarding consideration for LVAD insertion.   I introduced Palliative Medicine as specialized medical care for people living with serious illness. It focuses on providing relief from the symptoms and stress of a serious illness. The goal is to improve quality of life for both the patient and the family.  Marcus Johnson shares with me that he has been born and raised in [place], [place].  He is not married.  He has a son, Marcus Johnson and two granddaughters, Marcus Johnson, and Marcus Johnson they all live with Marcus Johnson.  Marcus Johnson also shares his brother, Marcus Johnson lives with him.  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  He shares with me that he gets tremendous joy out of going to the beach and swimming.  He states that he is a very religious man though does not consider himself  denomination all and he attends "[employer]".  Marcus Johnson expresses that before he got Covid 19 a few weeks ago, 4 weeks ago to be more precise he was functioning very well.  He states that he is autonomous for all basic activities of daily living as well as instrumental activities of daily living.  He goes on to tell me that when he got Covid he had a very hard time in regards to his ongoing dyspnea and nausea symptoms.  He shares that recovery from Covid has been very difficult.  Marcus Johnson tells me that he has had a diagnosis of heart failure for well over a decade now.  He states that 8 years ago he was told he would only have 1 year to live and has far surpassed this.  He expresses that he know he has a "bad heart" but he is not sure about LVAD insertion.    A detailed discussion was had today regarding advanced directives,  Marcus Johnson had never completed these though he is able to designate that he would wish for his HCPOA to be his son Marcus Johnson ad his first elected person and brother Marcus Johnson as his second elected person.    Concepts specific to code status, artifical feeding and hydration, continued IV antibiotics and rehospitalization was had.  We reviewed that he already has a poorly functioning heart. We reviewed that he does already have an AICD in place. We talked about what a rescuscitation  event in his case may look like. He shares that he would not want to go through things that would not help him in the long run or enhance his quality of life. I introduced a MOST form and we completed this as below:  Cardiopulmonary Resuscitation: Do Not Attempt Resuscitation (DNR/No CPR)  Medical Interventions: Limited Additional Interventions: Use medical treatment, IV fluids and cardiac monitoring as indicated, DO NOT USE intubation or mechanical ventilation. May consider use of less invasive airway support such as BiPAP or CPAP. Also provide comfort measures. Transfer to the hospital if indicated. Avoid  intensive care.   Antibiotics: Determine use of limitation of antibiotics when infection occurs  IV Fluids: IV fluids for a defined trial period  Feeding Tube: No feeding tube   The difference between a aggressive medical intervention path  and a palliative comfort care path for this patient at this time was had. We reviewed that he can pursue an LVAD to aid in his heart failure management and it may very well enable him additional time to pursue life goals in addition to him being a potential heart transplant candidate in the future. He shares with me that he had gone to a transplant team in [place] that said he would "not qualify" as they did not have a match. Per Marcus Johnson he was added to a donor recipient list at that time.   I asked Marcus Johnson what is important to him and what his goals are.  We talked a lot about religion and a greater plan in the grand scheme of life.  Marcus Johnson shared with me a story about his sister who was suffering from breast cancer.  He shares that around a year ago they had just ended a phone conversation in the evening and when he woke up in the morning he was told by his sister son that she had suddenly passed away.  This seems to have been a turning point that changed his perspective in many regards.  He shares with me that his main goal is to live a life with quality.  He states that before Covid he felt his life was "where it needed to be".  He expresses that he has come to terms with the reality that he is not going to be here forever.  Marcus Johnson goals at this point in time are to optimize the time he has with his grandchildren Marcus Johnson.  He shares that they are the most important factors in his life and the reason why he has wanted to be around as long as he has and the reason he believes God has allowed him to be around as long as he has despite his poor prognosis.  Marcus Johnson expresses that an LVAD would change his life and would in his opinion deplete his quality of life.  I  asked him to tell me more about this and he went on to tell me that going to the beach and swimming are extremely important to he and his granddaughters.  He has a hard time conceptualizing not being able to do that with them after LVAD placement.   [place] does not express any fear about LVAD placement though he does express great uncertainty about whether or not at the end of the day it truly would enhance his quality of life.  He shares with me that there are many uncertain factors to having it placed and from what he knows at this time he feels like there are more negatives than  positives in regards to its pursuit.  Marcus Johnson and I did talk about the reality that his heart failure is extremely severe and he could certainly suffer from an acute event.  I shared that if he decides not to pursue LVAD placement that it might be worthwhile to entertain the idea of hospice care.  He was not by any measure surprised that this was being discussed and is knowledgeable of hospice.  I described hospice as a service for patients for have a life expectancy of < 6 months. It preserves dignity and quality at the end phases of life. The focus changes from curative to symptom relief.   As it stands presently Marcus Johnson remains uncertain in regards to whether or not he would want to pursue LVAD placement.  He expresses that he knows the decision is up to him and only him and it is something which he is giving tremendous thought to.  From a social perspective Marcus Johnson has excellent support with both his brother Marcus Johnson who he would identify as his primary caregiver, should that be needed moving forward and his son, [name].  Zebulin also has a large Johnson family.  Patient was comfortable talking about the "what ifs" and the importance of today's conversation so everyone can have all the information to be full participants and to understand the patient's basic beliefs and wishes as it relates  to healthcare.  Provided "Hard Choices  for [employer]" booklet.  Decision Maker: Jeriko can make decisions for himself.  If unable to make decisions he would rely on his son [name] and his brother Marcus Johnson to aid in decision-making.  SUMMARY OF RECOMMENDATIONS   DNAR/DNI  MOST Completed, paper copy placed onto the chart electric copy can be found in [place]  DNR Form Completed, paper copy placed onto the chart electric copy can be found in [place]  Ongoing conversations with the heart failure team, present patient is not committed to the idea of LVAD placement  Emily has tremendous social support with his family and Johnson if he did pursue LVAD his brother, Farah Benish would be his primary caregiver  PMT will continue to follow along and support Tysheem throughout hospitalization  Much of [facility] discharge plan will depend on his decisions regarding LVAD placement   Code Status/Advance Care Planning: DNAR/DNI    Palliative Prophylaxis:   Oral Care, Mobility  Additional Recommendations (Limitations, Scope, Preferences):  Continue current scope of care   Psycho-social/Spiritual:   Desire for further Chaplaincy support: Yes  Additional Recommendations: Education on heart failure   Prognosis: Poor in the setting of advanced heart failure  Discharge Planning: Discharge to home when medically optimized  Vitals:   10/01/20 0416 10/01/20 0430  BP: 101/61 101/61  Pulse: 87   Resp: 18   Temp: 98 F (36.7 C)   SpO2: 100%     Intake/Output Summary (Last 24 hours) at 10/01/2020 2423 Last data filed at 10/01/2020 0600 Gross per 24 hour  Intake 1260.87 ml  Output 5250 ml  Net -3989.13 ml   Last Weight  Most recent update: 10/01/2020  4:45 AM   Weight  88.9 kg (195 lb 15.8 oz)           Gen:  AA M in NAD HEENT: moist mucous membranes CV: Regular rate and rhythm  PULM: On RA clear to auscultation bilaterally  ABD: soft/nontender  EXT: Trace pedal edema Neuro: Alert and oriented x4  PPS: 50%   This  conversation/these recommendations were discussed with patient  primary care team, Dr. Aundra Dubin  Time In: 0640 Time Out: 0800 Total Time: 86 Greater than 50%  of this time was spent counseling and coordinating care related to the above assessment and plan.  [place] Team Team Cell Phone: [phone] Please utilize secure chat with additional questions, if there is no response within 30 minutes please call the above phone number  Palliative Medicine Team providers are available by phone from 7am to 7pm daily and can be reached through the team cell phone.  Should this patient require assistance outside of these hours, please call the patient's attending physician.

## 2020-10-01 NOTE — Progress Notes (Signed)
While putting DNR armband on patient this am patient verbalized understanding of DNR patient stated if his heart should stop he will like to be left alone.

## 2020-10-01 NOTE — Progress Notes (Addendum)
Patient ID: Marcus Johnson., male   DOB: 04-21-1961, 60 y.o.   MRN: [id number]     Advanced Heart Failure Rounding Note  PCP-Cardiologist: No primary care provider on file.   Subjective:    Milrinone increased to 0.375 yesterday due to co-ox 45%. Co-ox today 57% Diuresed well on lasix gtt at 12 and metolazone. Out almost 5L. Weight down 10 pounds.  Creatinine 1.5 -> 1.7 -> 1.8 -> 2.1   CVP 7-8  Denies CP or SOB.   Echo: EF 20-25%, normal RV, PASP 61, dilated IVC.    Objective:   Weight Range: 88.9 kg Body mass index is 27.72 kg/m.   Vital Signs:   Temp:  [97.8 F (36.6 C)-98.5 F (36.9 C)] 98.1 F (36.7 C) (02/26 1010) Pulse Rate:  [85-100] 85 (02/26 1010) Resp:  [17-18] 18 (02/26 0416) BP: (90-117)/(46-84) 101/61 (02/26 0430) SpO2:  [99 %-100 %] 100 % (02/26 0416) Weight:  [88.9 kg] 88.9 kg (02/26 0445) Last BM Date: 09/29/20  Weight change: Filed Weights   09/29/20 0400 09/30/20 0412 10/01/20 0445  Weight: 97.5 kg 93.2 kg 88.9 kg    Intake/Output:   Intake/Output Summary (Last 24 hours) at 10/01/2020 1021 Last data filed at 10/01/2020 0858 Gross per 24 hour  Intake 1540.87 ml  Output 4675 ml  Net -3134.13 ml      Physical Exam    General:  Sitting up in bed. No resp difficulty HEENT: normal Neck: supple. no JVD. Carotids 2+ bilat; no bruits. No lymphadenopathy or thryomegaly appreciated. Cor: PMI nondisplaced. Regular rate & rhythm. No rubs, gallops or murmurs. Lungs: clear Abdomen: soft, nontender, nondistended. No hepatosplenomegaly. No bruits or masses. Good bowel sounds. Extremities: no cyanosis, clubbing, rash, + unna 1+ edema Neuro: alert & orientedx3, cranial nerves grossly intact. moves all 4 extremities w/o difficulty. Affect pleasant   Telemetry   NSR 80 with frequent PACs/PVCs couplets and triplets Personally reviewed  Labs    CBC Recent Labs    09/30/20 1437 10/01/20 0432  WBC 8.1 7.4  NEUTROABS 4.9  --   HGB 11.6* 12.0*   HCT 38.6* 39.1  MCV 80.9 80.3  PLT 174 696   Basic Metabolic Panel Recent Labs    09/30/20 0440 10/01/20 0432  NA 138 137  K 3.5 3.4*  CL 98 92*  CO2 29 31  GLUCOSE 165* 196*  BUN 52* 53*  CREATININE 1.84* 2.10*  CALCIUM 9.3 9.9  MG 2.2  --    Liver Function Tests No results for input(s): AST, ALT, ALKPHOS, BILITOT, PROT, ALBUMIN in the last 72 hours. No results for input(s): LIPASE, AMYLASE in the last 72 hours. Cardiac Enzymes No results for input(s): CKTOTAL, CKMB, CKMBINDEX, TROPONINI in the last 72 hours.  BNP: BNP (last 3 results) No results for input(s): BNP in the last 8760 hours.  ProBNP (last 3 results) No results for input(s): PROBNP in the last 8760 hours.   D-Dimer No results for input(s): DDIMER in the last 72 hours. Hemoglobin A1C Recent Labs    09/29/20 0003  HGBA1C 7.7*   Fasting Lipid Panel Recent Labs    09/30/20 1920  CHOL 103  HDL 29*  LDLCALC 59  TRIG 76  CHOLHDL 3.6   Thyroid Function Tests No results for input(s): TSH, T4TOTAL, T3FREE, THYROIDAB in the last 72 hours.  Invalid input(s): FREET3  Other results:   Imaging    CT ABDOMEN PELVIS WO CONTRAST  Result Date: 09/30/2020 CLINICAL DATA:  Preoperative evaluation  prior to planned ventricular assist device placement. EXAM: CT CHEST, ABDOMEN AND PELVIS WITHOUT CONTRAST TECHNIQUE: Multidetector CT imaging of the chest, abdomen and pelvis was performed following the standard protocol without IV contrast. COMPARISON:  CT 01/11/2018. FINDINGS: CT CHEST FINDINGS Cardiovascular: Diffuse three-vessel coronary artery atherosclerosis with lesser involvement of the aorta and great vessel status post median sternotomy and CABG. Right arm PICC projects to the superior cavoatrial junction. Left subclavian pacemaker leads appear unchanged. There is stable mild cardiomegaly. No significant pericardial fluid. Mediastinum/Nodes: There are no enlarged mediastinal, hilar or axillary lymph nodes.  Small mediastinal lymph nodes are stable. Hilar assessment is limited by the lack of intravenous contrast, although the hilar contours appear unchanged. The thyroid gland, trachea and esophagus demonstrate no significant findings. Lungs/Pleura: No pleural effusion or pneumothorax. The lungs are clear. There are probable mild centrilobular emphysematous changes. Musculoskeletal/Chest wall: No chest wall mass or suspicious osseous findings. Mild asymmetric left-sided gynecomastia. CT ABDOMEN AND PELVIS FINDINGS Hepatobiliary: The liver is normal in density without suspicious focal abnormality. Dependent small gallstones or high density bile in the gallbladder, similar to previous study. No gallbladder wall thickening, surrounding inflammation or biliary dilatation. Pancreas: Unremarkable. No pancreatic ductal dilatation or surrounding inflammatory changes. Spleen: Normal in size without focal abnormality. Adrenals/Urinary Tract: Both adrenal glands appear normal. Renal vascular calcifications and symmetric perinephric soft tissue stranding bilaterally are stable. No evidence of urinary tract calculus or hydronephrosis. The bladder appears unremarkable. Stomach/Bowel: The stomach appears unremarkable for its degree of distension. No evidence of bowel wall thickening, distention or surrounding inflammatory change. The appendix appears normal. Vascular/Lymphatic: There are no enlarged abdominal or pelvic lymph nodes. Stable small lymph nodes in the porta hepatis. Diffuse aortic and branch vessel atherosclerosis without acute vascular findings on noncontrast imaging. Reproductive: Stable mild enlargement of the prostate gland. Other: No evidence of abdominal wall mass or hernia. No ascites. Musculoskeletal: No acute or significant osseous findings. IMPRESSION: 1. No acute findings or significant changes from previous study. 2. Cholelithiasis without evidence of cholecystitis or biliary dilatation. 3. Cardiomegaly post  CABG.  Aortic Atherosclerosis (ICD10-I70.0). Electronically Signed   By: Richardean Sale M.D.   On: 09/30/2020 17:42   DG Orthopantogram  Result Date: 09/30/2020 CLINICAL DATA:  Pre LVAD evaluation to rule out surgical contraindication. EXAM: ORTHOPANTOGRAM/PANORAMIC COMPARISON:  01/08/2018 FINDINGS: There are extensive caries, with caries destruction of numerous teeth in the maxilla and mandible. No evidence for periapical abscess or sinus fluid. IMPRESSION: Multiple caries, with destruction of numerous teeth in the maxilla and mandible. Electronically Signed   By: Nolon Nations M.D.   On: 09/30/2020 16:52   CT CHEST WO CONTRAST  Result Date: 09/30/2020 CLINICAL DATA:  Preoperative evaluation prior to planned ventricular assist device placement. EXAM: CT CHEST, ABDOMEN AND PELVIS WITHOUT CONTRAST TECHNIQUE: Multidetector CT imaging of the chest, abdomen and pelvis was performed following the standard protocol without IV contrast. COMPARISON:  CT 01/11/2018. FINDINGS: CT CHEST FINDINGS Cardiovascular: Diffuse three-vessel coronary artery atherosclerosis with lesser involvement of the aorta and great vessel status post median sternotomy and CABG. Right arm PICC projects to the superior cavoatrial junction. Left subclavian pacemaker leads appear unchanged. There is stable mild cardiomegaly. No significant pericardial fluid. Mediastinum/Nodes: There are no enlarged mediastinal, hilar or axillary lymph nodes. Small mediastinal lymph nodes are stable. Hilar assessment is limited by the lack of intravenous contrast, although the hilar contours appear unchanged. The thyroid gland, trachea and esophagus demonstrate no significant findings. Lungs/Pleura: No pleural effusion or pneumothorax.  The lungs are clear. There are probable mild centrilobular emphysematous changes. Musculoskeletal/Chest wall: No chest wall mass or suspicious osseous findings. Mild asymmetric left-sided gynecomastia. CT ABDOMEN AND PELVIS  FINDINGS Hepatobiliary: The liver is normal in density without suspicious focal abnormality. Dependent small gallstones or high density bile in the gallbladder, similar to previous study. No gallbladder wall thickening, surrounding inflammation or biliary dilatation. Pancreas: Unremarkable. No pancreatic ductal dilatation or surrounding inflammatory changes. Spleen: Normal in size without focal abnormality. Adrenals/Urinary Tract: Both adrenal glands appear normal. Renal vascular calcifications and symmetric perinephric soft tissue stranding bilaterally are stable. No evidence of urinary tract calculus or hydronephrosis. The bladder appears unremarkable. Stomach/Bowel: The stomach appears unremarkable for its degree of distension. No evidence of bowel wall thickening, distention or surrounding inflammatory change. The appendix appears normal. Vascular/Lymphatic: There are no enlarged abdominal or pelvic lymph nodes. Stable small lymph nodes in the porta hepatis. Diffuse aortic and branch vessel atherosclerosis without acute vascular findings on noncontrast imaging. Reproductive: Stable mild enlargement of the prostate gland. Other: No evidence of abdominal wall mass or hernia. No ascites. Musculoskeletal: No acute or significant osseous findings. IMPRESSION: 1. No acute findings or significant changes from previous study. 2. Cholelithiasis without evidence of cholecystitis or biliary dilatation. 3. Cardiomegaly post CABG.  Aortic Atherosclerosis (ICD10-I70.0). Electronically Signed   By: Richardean Sale M.D.   On: 09/30/2020 17:42     Medications:     Scheduled Medications: . amiodarone  200 mg Oral BID  . apixaban  5 mg Oral BID  . carvedilol  3.125 mg Oral BID WC  . Chlorhexidine Gluconate Cloth  6 each Topical Daily  . digoxin  0.125 mg Oral Daily  . empagliflozin  10 mg Oral Daily  . insulin aspart  0-15 Units Subcutaneous TID WC  . multivitamin with minerals  1 tablet Oral Daily  . rosuvastatin   40 mg Oral Daily  . sodium chloride flush  10-40 mL Intracatheter Q12H  . spironolactone  12.5 mg Oral Daily    Infusions: . sodium chloride Stopped (09/28/20 1746)  . furosemide (LASIX) 200 mg in dextrose 5% 100 mL (64m/mL) infusion 12 mg/hr (09/30/20 0612)  . milrinone 0.375 mcg/kg/min (10/01/20 0829)    PRN Medications: sodium chloride, acetaminophen, ondansetron (ZOFRAN) IV, ondansetron, sodium chloride flush    Assessment/Plan   1. COVID-19 infection:in 1/22. Manifested primarily as nausea/vomiting. Very slow recovery, did not take meds for at least 3 wks but had recently started back on meds just prior to admission.  Nausea seems better today.  - Suspect that nausea/poor appetite was actually due to HF.  2.Acute on chronic systolic HF:ICM.HasMedtronic ICD. Narrow QRS, not CRT candidate. Has required milrinone multiple times in the past for low output HF. Echo 6/19 with EF 20% and moderately decreased RV systolic function.CPX 4/19 with severe functional limitation [facility] showed low output and coronary angiography showed no interventional targets.Echo in 4/21 showed EF 25%, mildly decreased RV systolic function. CPX in 5/21 showed severe HF limitation. He has been resistant to LVAD/transplant evaluation, both offered with discussions in past. I worry that we will miss the window for advanced therapies. He was not interested in barostimulatoractivation therapy. History of cardiorenal syndrome/diabetic nephropathy, creatinine up to 2.33 in 1/22, likely due to dehydration with nausea/vomiting.He had been off meds for several weeks, just restarted 3-4 days PTA. He remains volume overloaded with low output HF. Milrinone increased to 0.375 yesterday due to co-ox 45%. Co-ox today 57% Diuresed well.  Out almost 5L. Weight down 10 pounds. - CVP 7-8 creatinine up. Hold lasix and metoalzone today to give kidneys a break and follow creatinine. Can restart diuretics  tomorrow -Continue digoxin 0.125 daily (level ok).  - Will stop carvedilol with increased milrinone requirement - Continue spironolactone 12.5 daily. May need to hold with AKI - Will hold empagliflozin for now with AKI  - Hold Entresto with low BP  - LVAD discussions ongoing. Met with Palliative Care team today about it. Remains unsure if he wants to pursue VAD at this point. Not currently transplant candidate. Can consider Impella 5.5 as needed to bridge to duraale VAD though co-ox now improved on higher dose milrinone.  2. CAD s/p CABG2002:Last cath in 6/19 with patent grafts. No chest pain.  -No ASA given apixaban use.  - Continue Crestor 40 mg daily.  3. AKI on CKD Stage 3b: I suspect that this is a combination of cardiorenal syndrome and diabetic nephropathy and possibly ATN. Creatinine continues to rise despite improved co-ox 1.8-> 2.1.  - continue hemodynamic support  - need to keep MAPs > 70. Can add NE as needed - avoid overdiuresis 4. IN:OMVEHMC of very poor control but hgbA1ctrending down. Most recent 7.7%  - Cover with SSI. 5. Atrial flutter: s/p DC-CV 08/21/18.He is in NSR currently but note runs of ?flutter vs fibrillation on device interrogation. Very frequent PACs on telemetry -> confirmed on ECG 2/25 - Continue amiodarone 200 mg bid.   - Continue Eliquis.  Length of Stay: 3  Glori Bickers, MD  10/01/2020, 10:21 AM  Advanced Heart Failure Team Pager [phone] (M-F; 7a - 4p)  Please contact [facility] for night-coverage after hours (4p -7a ) and weekends on [email or web address]

## 2020-10-02 DIAGNOSIS — Z7189 Other specified counseling: Secondary | ICD-10-CM | POA: Diagnosis not present

## 2020-10-02 DIAGNOSIS — Z515 Encounter for palliative care: Secondary | ICD-10-CM | POA: Diagnosis not present

## 2020-10-02 DIAGNOSIS — I5023 Acute on chronic systolic (congestive) heart failure: Secondary | ICD-10-CM | POA: Diagnosis not present

## 2020-10-02 LAB — CBC
HCT: 36.7 % — ABNORMAL LOW (ref 39.0–52.0)
Hemoglobin: 11.6 g/dL — ABNORMAL LOW (ref 13.0–17.0)
MCH: 24.9 pg — ABNORMAL LOW (ref 26.0–34.0)
MCHC: 31.6 g/dL (ref 30.0–36.0)
MCV: 78.9 fL — ABNORMAL LOW (ref 80.0–100.0)
Platelets: 190 10*3/uL (ref 150–400)
RBC: 4.65 MIL/uL (ref 4.22–5.81)
RDW: 20.7 % — ABNORMAL HIGH (ref 11.5–15.5)
WBC: 8.2 10*3/uL (ref 4.0–10.5)
nRBC: 0 % (ref 0.0–0.2)

## 2020-10-02 LAB — COOXEMETRY PANEL
Carboxyhemoglobin: 1.9 % — ABNORMAL HIGH (ref 0.5–1.5)
Carboxyhemoglobin: 2 % — ABNORMAL HIGH (ref 0.5–1.5)
Methemoglobin: 0.7 % (ref 0.0–1.5)
Methemoglobin: 0.9 % (ref 0.0–1.5)
O2 Saturation: 50.7 %
O2 Saturation: 53.6 %
Total hemoglobin: 12 g/dL (ref 12.0–16.0)
Total hemoglobin: 12.2 g/dL (ref 12.0–16.0)

## 2020-10-02 LAB — BASIC METABOLIC PANEL
Anion gap: 14 (ref 5–15)
BUN: 59 mg/dL — ABNORMAL HIGH (ref 6–20)
CO2: 33 mmol/L — ABNORMAL HIGH (ref 22–32)
Calcium: 9.6 mg/dL (ref 8.9–10.3)
Chloride: 89 mmol/L — ABNORMAL LOW (ref 98–111)
Creatinine, Ser: 2.25 mg/dL — ABNORMAL HIGH (ref 0.61–1.24)
GFR, Estimated: 33 mL/min — ABNORMAL LOW (ref >60)
Glucose, Bld: 208 mg/dL — ABNORMAL HIGH (ref 70–99)
Potassium: 3.6 mmol/L (ref 3.5–5.1)
Sodium: 136 mmol/L (ref 135–145)

## 2020-10-02 LAB — DRVVT CONFIRM: dRVVT Confirm: 0.9 ratio (ref 0.8–1.2)

## 2020-10-02 LAB — GLUCOSE, CAPILLARY
Glucose-Capillary: 192 mg/dL — ABNORMAL HIGH (ref 70–99)
Glucose-Capillary: 244 mg/dL — ABNORMAL HIGH (ref 70–99)
Glucose-Capillary: 189 mg/dL — ABNORMAL HIGH (ref 70–99)
Glucose-Capillary: 236 mg/dL — ABNORMAL HIGH (ref 70–99)

## 2020-10-02 LAB — HEPATITIS C ANTIBODY: HCV Ab: NONREACTIVE

## 2020-10-02 LAB — LUPUS ANTICOAGULANT PANEL
DRVVT: 66.6 s — ABNORMAL HIGH (ref 0.0–47.0)
PTT Lupus Anticoagulant: 39.6 s (ref 0.0–51.9)

## 2020-10-02 LAB — HEPATITIS B CORE ANTIBODY, TOTAL: Hep B Core Total Ab: NONREACTIVE

## 2020-10-02 LAB — DRVVT MIX: dRVVT Mix: 48.8 s — ABNORMAL HIGH (ref 0.0–40.4)

## 2020-10-02 MED ORDER — POTASSIUM CHLORIDE CRYS ER 20 MEQ PO TBCR
20.0000 meq | EXTENDED_RELEASE_TABLET | Freq: Once | ORAL | Status: DC
  Filled 2020-10-02: qty 1

## 2020-10-02 MED ORDER — DOBUTAMINE IN D5W 4-5 MG/ML-% IV SOLN
2.0000 ug/kg/min | INTRAVENOUS | Status: DC
  Administered 2020-10-02: 5 ug/kg/min via INTRAVENOUS
  Administered 2020-10-03: 7.5 ug/kg/min via INTRAVENOUS
  Administered 2020-10-07 – 2020-10-13 (×3): 2 ug/kg/min via INTRAVENOUS
  Filled 2020-10-02 (×4): qty 250

## 2020-10-02 MED ORDER — POTASSIUM CHLORIDE CRYS ER 20 MEQ PO TBCR
20.0000 meq | EXTENDED_RELEASE_TABLET | Freq: Once | ORAL | Status: AC
  Administered 2020-10-02: 20 meq via ORAL
  Filled 2020-10-02: qty 1

## 2020-10-02 NOTE — Progress Notes (Signed)
Patient ID: Marcus Johnson., male   DOB: 10-02-1960, 60 y.o.   MRN: [id number]     Advanced Heart Failure Rounding Note  PCP-Cardiologist: No primary care provider on file.   Subjective:    Remains on milrinone 0.375. Co-ox marginal at 51%  Lasix stopped yesterday due to rising creatinine 1.5 -> 1.7 -> 1.8 -> 2.1. Creatinine 2.25 this am CVP 7-8  Multiple discussions yesterday with Palliative Care regarding potential VAD implant and Code Status. Now full code.   Denies SOB, orthopnea or PND. Remains very anxious about the risk of VAD  Echo: EF 20-25%, normal RV, PASP 61, dilated IVC.    Objective:   Weight Range: 87.9 kg Body mass index is 27.41 kg/m.   Vital Signs:   Temp:  [98 F (36.7 C)-98.6 F (37 C)] 98.6 F (37 C) (02/27 0436) Pulse Rate:  [48-111] 102 (02/27 0438) Resp:  [16-18] 18 (02/27 0436) BP: (68-107)/(55-70) 101/66 (02/27 0436) SpO2:  [98 %-100 %] 99 % (02/27 0436) Weight:  [87.9 kg] 87.9 kg (02/27 0436) Last BM Date: 10/01/20  Weight change: Filed Weights   09/30/20 0412 10/01/20 0445 10/02/20 0436  Weight: 93.2 kg 88.9 kg 87.9 kg    Intake/Output:   Intake/Output Summary (Last 24 hours) at 10/02/2020 0708 Last data filed at 10/01/2020 2357 Gross per 24 hour  Intake 1697 ml  Output 1800 ml  Net -103 ml      Physical Exam    General:  Sitting up in bed. No resp difficulty HEENT: normal Neck: supple. no JVD 7-8. Carotids 2+ bilat; no bruits. No lymphadenopathy or thryomegaly appreciated. Cor: PMI nondisplaced. Tachy regular  No rubs, gallops or murmurs. Lungs: clear Abdomen: soft, nontender, nondistended. No hepatosplenomegaly. No bruits or masses. Good bowel sounds. Extremities: no cyanosis, clubbing, rash, edema Neuro: alert & orientedx3, cranial nerves grossly intact. moves all 4 extremities w/o difficulty. Affect pleasant    Telemetry   NSR 80 with frequent PACs/PVCs couplets and triplets Personally reviewed  Labs     CBC Recent Labs    09/30/20 1437 10/01/20 0432 10/02/20 0637  WBC 8.1 7.4 8.2  NEUTROABS 4.9  --   --   HGB 11.6* 12.0* 11.6*  HCT 38.6* 39.1 36.7*  MCV 80.9 80.3 78.9*  PLT 174 182 676   Basic Metabolic Panel Recent Labs    09/30/20 0440 10/01/20 0432 10/02/20 0600  NA 138 137 136  K 3.5 3.4* 3.6  CL 98 92* 89*  CO2 29 31 33*  GLUCOSE 165* 196* 208*  BUN 52* 53* 59*  CREATININE 1.84* 2.10* 2.25*  CALCIUM 9.3 9.9 9.6  MG 2.2  --   --    Liver Function Tests No results for input(s): AST, ALT, ALKPHOS, BILITOT, PROT, ALBUMIN in the last 72 hours. No results for input(s): LIPASE, AMYLASE in the last 72 hours. Cardiac Enzymes No results for input(s): CKTOTAL, CKMB, CKMBINDEX, TROPONINI in the last 72 hours.  BNP: BNP (last 3 results) No results for input(s): BNP in the last 8760 hours.  ProBNP (last 3 results) No results for input(s): PROBNP in the last 8760 hours.   D-Dimer No results for input(s): DDIMER in the last 72 hours. Hemoglobin A1C No results for input(s): HGBA1C in the last 72 hours. Fasting Lipid Panel Recent Labs    09/30/20 1920  CHOL 103  HDL 29*  LDLCALC 59  TRIG 76  CHOLHDL 3.6   Thyroid Function Tests No results for input(s): TSH, T4TOTAL, T3FREE,  THYROIDAB in the last 72 hours.  Invalid input(s): FREET3  Other results:   Imaging    No results found.   Medications:     Scheduled Medications: . amiodarone  200 mg Oral BID  . apixaban  5 mg Oral BID  . Chlorhexidine Gluconate Cloth  6 each Topical Daily  . digoxin  0.125 mg Oral Daily  . insulin aspart  0-15 Units Subcutaneous TID WC  . multivitamin with minerals  1 tablet Oral Daily  . rosuvastatin  40 mg Oral Daily  . sodium chloride flush  10-40 mL Intracatheter Q12H    Infusions: . sodium chloride Stopped (09/28/20 1746)  . milrinone 0.375 mcg/kg/min (10/01/20 1825)    PRN Medications: sodium chloride, acetaminophen, ondansetron (ZOFRAN) IV, ondansetron,  sodium chloride flush    Assessment/Plan   1. COVID-19 infection:in 1/22. Manifested primarily as nausea/vomiting. Very slow recovery, did not take meds for at least 3 wks but had recently started back on meds just prior to admission.  Nausea seems better today.  - Suspect that nausea/poor appetite was actually due to HF.  2.Acute on chronic systolic HF:ICM.HasMedtronic ICD. Narrow QRS, not CRT candidate. Has required milrinone multiple times in the past for low output HF. Echo 6/19 with EF 20% and moderately decreased RV systolic function.CPX 4/19 with severe functional limitation due to [facility] 6/19 showed low output and coronary angiography showed no interventional targets.Echo in 4/21 showed EF 25%, mildly decreased RV systolic function. CPX in 5/21 showed severe HF limitation. He has been resistant to LVAD/transplant evaluation, both offered with discussions in past. I worry that we will miss the window for advanced therapies. He was not interested in barostimulatoractivation therapy. History of cardiorenal syndrome/diabetic nephropathy, creatinine up to 2.33 in 1/22, likely due to dehydration with nausea/vomiting.He had been off meds for several weeks, just restarted 3-4 days PTA. He remains volume overloaded with low output HF. Milrinone increased to 0.375 on 2/26. Co-ox remains marginal at 51% Lasix on hold due to rising creatinine. CVP 7-8 -Continue digoxin 0.125 daily (level ok).  - Off carvedilol with shock - Will hold spiro with ongoing AKI - Off empagliflozin for now with AKI  - Off Entresto with low BP  - Multiple discussions yesterday with Palliative Care regarding potential VAD implant and Code Status. Patient remains very anxious about VAD. I had a long talk with him about the pros/cons and he is currently happy with his QOL and would like to live longer but still unsure about VAD. He said some of his family supports it and others don't. - I told him that if he  has any plans to pursue VAD will need to move to 2h to start NE +/- consider Impella 5.5 to support renal function and bridge to durable VAD. He refused to be transferred to [facility] at this time and said he may just want to go home - With SBPs in 90-100 range with switch milrinone to DBA 5 to see if this helps support renal function.  - I discussed with his son [name] as well. I suggested they have a family meeting early this week to come to a consensus.  - Will leave as Full Code now. He realizes that if he gets worse we will again try to move him to [facility]. 2. CAD s/p CABG2002:Last cath in 6/19 with patent grafts. No s/s ischemia -No ASA given apixaban use.  - Continue Crestor 40 mg daily.  3. AKI on CKD Stage 3b: I suspect that  this is a combination of cardiorenal syndrome and diabetic nephropathy and possibly ATN. Creatinine continues to rise despite improved co-ox 1.8-> 2.1 -> 2.25  - continue hemodynamic support  - need to keep MAPs > 70. Can add NE as needed - avoid overdiuresis 4. WQ:VLDKCCQ of very poor control but hgbA1ctrending down. Most recent 7.7%  - Cover with SSI. 5. Atrial flutter: s/p DC-CV 08/21/18.He is in NSR currently but note runs of ?flutter vs fibrillation on device interrogation. Very frequent PACs on telemetry -> confirmed on ECG 2/25 - Continue amiodarone 200 mg bid.   - Continue Eliquis.  Total time spent 60 minutes. Over half that time spent discussing above.   Length of Stay: [place], MD  10/02/2020, 7:08 AM  Advanced Heart Failure Team Pager [phone] (M-F; 7a - 4p)  Please contact [facility] for night-coverage after hours (4p -7a ) and weekends on [email or web address]

## 2020-10-02 NOTE — Progress Notes (Signed)
Patient is refusing to be transfer to ICU for Dobutamine infusion MD paged awaiting MD call back. MD okay to keep patient on 3 east no dobutamine titration.

## 2020-10-02 NOTE — Progress Notes (Signed)
Palliative Medicine Inpatient Follow Up Note   Reason for consult:  LVAD Evaluation  HPI:  Per intake H&P --> Marcus Lindsay Harpenau Jr. is a 59 y.o. male who has a h/o DM2, CAD s/p CABG 2002, systolic HF due to ischemic cardiomyopathy with EF 20-25% (echo 12/15), DM2 and CKD. He is s/p Medtronic single chamber ICD.  Allah was seen by the PMT in 2019 at this time there was discussion of his poor cardiac function. Then LVAD was brought up in conversation and he was not at a point where he wanted to entertain this thought nor pursue it.   The Palliative medicine team has been asked to aid in evaluation of [name] for LVAD readiness.  Today's Discussion (10/02/2020):  *Please note that this is a verbal dictation therefore any spelling or grammatical errors are due to the "[employer]" system interpretation.  I stopped by [facility] this morning. I spoke to unit RN, Dianna nd she shared with me that Dr. Bensimhon had just met with Nathanel.   When I went in to see [name] the lights were low and he expressed that he did not want to talk to me today after I introduced myself. He went on the share that Dr. Bensimhon wants to transfer him to 2H and he "is sick of it". I allowed time to express some of these frustrations. It appeared through conversation that he is frustrated he is not improving to his baseline. I attempted to probe further though, he shares that again, he does not wish to speak to me today.   I kindly excused myself from the room and provided my information should be have a change of his mind.   SUMMARY OF RECOMMENDATIONS   Full Code   Patient expresses not wanting to go to 2H for additional support though he still would like full scope of treatment  Ongoing support, it appears that the present situation may be causing patient to be overwhelmed and uncertain of which direction to travel  Khiem refused an ongoing dialogue with PMT this morning but we are available if  for any reason he decides he would like to speak to us  Time Spent: 15 Greater than 50% of the time was spent in counseling and coordination of care ______________________________________________________________________________________ Michelle Ferolito [facility] Palliative Medicine Team Team Cell Phone: [phone] Please utilize secure chat with additional questions, if there is no response within 30 minutes please call the above phone number  Palliative Medicine Team providers are available by phone from 7am to 7pm daily and can be reached through the team cell phone.  Should this patient require assistance outside of these hours, please call the patient's attending physician.     

## 2020-10-03 ENCOUNTER — Inpatient Hospital Stay (HOSPITAL_COMMUNITY): Payer: Medicare HMO

## 2020-10-03 DIAGNOSIS — Z8616 Personal history of COVID-19: Secondary | ICD-10-CM

## 2020-10-03 DIAGNOSIS — Z7901 Long term (current) use of anticoagulants: Secondary | ICD-10-CM

## 2020-10-03 DIAGNOSIS — Z01810 Encounter for preprocedural cardiovascular examination: Secondary | ICD-10-CM

## 2020-10-03 DIAGNOSIS — N183 Chronic kidney disease, stage 3 unspecified: Secondary | ICD-10-CM

## 2020-10-03 DIAGNOSIS — I4891 Unspecified atrial fibrillation: Secondary | ICD-10-CM

## 2020-10-03 DIAGNOSIS — Z515 Encounter for palliative care: Secondary | ICD-10-CM | POA: Diagnosis not present

## 2020-10-03 DIAGNOSIS — I5023 Acute on chronic systolic (congestive) heart failure: Secondary | ICD-10-CM

## 2020-10-03 LAB — BASIC METABOLIC PANEL
Anion gap: 15 (ref 5–15)
Anion gap: 13 (ref 5–15)
BUN: 56 mg/dL — ABNORMAL HIGH (ref 6–20)
BUN: 54 mg/dL — ABNORMAL HIGH (ref 6–20)
CO2: 31 mmol/L (ref 22–32)
CO2: 29 mmol/L (ref 22–32)
Calcium: 9.3 mg/dL (ref 8.9–10.3)
Calcium: 9.2 mg/dL (ref 8.9–10.3)
Chloride: 90 mmol/L — ABNORMAL LOW (ref 98–111)
Chloride: 91 mmol/L — ABNORMAL LOW (ref 98–111)
Creatinine, Ser: 2.46 mg/dL — ABNORMAL HIGH (ref 0.61–1.24)
Creatinine, Ser: 2.48 mg/dL — ABNORMAL HIGH (ref 0.61–1.24)
GFR, Estimated: 29 mL/min — ABNORMAL LOW (ref >60)
GFR, Estimated: 29 mL/min — ABNORMAL LOW (ref >60)
Glucose, Bld: 201 mg/dL — ABNORMAL HIGH (ref 70–99)
Glucose, Bld: 296 mg/dL — ABNORMAL HIGH (ref 70–99)
Potassium: 3.8 mmol/L (ref 3.5–5.1)
Potassium: 3.6 mmol/L (ref 3.5–5.1)
Sodium: 136 mmol/L (ref 135–145)
Sodium: 133 mmol/L — ABNORMAL LOW (ref 135–145)

## 2020-10-03 LAB — COOXEMETRY PANEL
Carboxyhemoglobin: 1.8 % — ABNORMAL HIGH (ref 0.5–1.5)
Carboxyhemoglobin: 1.9 % — ABNORMAL HIGH (ref 0.5–1.5)
Methemoglobin: 0.7 % (ref 0.0–1.5)
Methemoglobin: 0.8 % (ref 0.0–1.5)
O2 Saturation: 57.5 %
O2 Saturation: 53.7 %
Total hemoglobin: 11.3 g/dL — ABNORMAL LOW (ref 12.0–16.0)
Total hemoglobin: 10.2 g/dL — ABNORMAL LOW (ref 12.0–16.0)

## 2020-10-03 LAB — PULMONARY FUNCTION TEST
FEF 25-75 Pre: 3.18 L/sec
FEF2575-%Pred-Pre: 106 %
FEV1-%Pred-Pre: 63 %
FEV1-Pre: 2.05 L
FEV1FVC-%Pred-Pre: 112 %
FEV6-%Pred-Pre: 58 %
FEV6-Pre: 2.33 L
FEV6FVC-%Pred-Pre: 103 %
FVC-%Pred-Pre: 56 %
FVC-Pre: 2.33 L
Pre FEV1/FVC ratio: 88 %
Pre FEV6/FVC Ratio: 100 %

## 2020-10-03 LAB — MAGNESIUM
Magnesium: 2.3 mg/dL (ref 1.7–2.4)
Magnesium: 2.5 mg/dL — ABNORMAL HIGH (ref 1.7–2.4)

## 2020-10-03 LAB — CBC
HCT: 38.5 % — ABNORMAL LOW (ref 39.0–52.0)
Hemoglobin: 11.7 g/dL — ABNORMAL LOW (ref 13.0–17.0)
MCH: 24.5 pg — ABNORMAL LOW (ref 26.0–34.0)
MCHC: 30.4 g/dL (ref 30.0–36.0)
MCV: 80.5 fL (ref 80.0–100.0)
Platelets: 177 10*3/uL (ref 150–400)
RBC: 4.78 MIL/uL (ref 4.22–5.81)
RDW: 20.5 % — ABNORMAL HIGH (ref 11.5–15.5)
WBC: 6.8 10*3/uL (ref 4.0–10.5)
nRBC: 0 % (ref 0.0–0.2)

## 2020-10-03 LAB — DIGOXIN LEVEL: Digoxin Level: 0.5 ng/mL — ABNORMAL LOW (ref 0.8–2.0)

## 2020-10-03 LAB — HEPARIN LEVEL (UNFRACTIONATED): Heparin Unfractionated: >2.2 IU/mL — ABNORMAL HIGH (ref 0.30–0.70)

## 2020-10-03 LAB — SURGICAL PCR SCREEN
MRSA, PCR: NEGATIVE
Staphylococcus aureus: NEGATIVE

## 2020-10-03 LAB — GLUCOSE, CAPILLARY
Glucose-Capillary: 207 mg/dL — ABNORMAL HIGH (ref 70–99)
Glucose-Capillary: 191 mg/dL — ABNORMAL HIGH (ref 70–99)
Glucose-Capillary: 173 mg/dL — ABNORMAL HIGH (ref 70–99)
Glucose-Capillary: 220 mg/dL — ABNORMAL HIGH (ref 70–99)

## 2020-10-03 LAB — TYPE AND SCREEN
ABO/RH(D): A NEG
Antibody Screen: NEGATIVE

## 2020-10-03 LAB — APTT: aPTT: 60 seconds — ABNORMAL HIGH (ref 24–36)

## 2020-10-03 MED ORDER — POTASSIUM CHLORIDE 10 MEQ/50ML IV SOLN
10.0000 meq | INTRAVENOUS | Status: AC
  Administered 2020-10-03 – 2020-10-04 (×2): 10 meq via INTRAVENOUS
  Filled 2020-10-03 (×2): qty 50

## 2020-10-03 MED ORDER — AMIODARONE HCL 200 MG PO TABS
200.0000 mg | ORAL_TABLET | Freq: Two times a day (BID) | ORAL | Status: DC
  Administered 2020-10-03 – 2020-10-13 (×20): 200 mg via ORAL
  Filled 2020-10-03 (×20): qty 1

## 2020-10-03 MED ORDER — HEPARIN (PORCINE) 25000 UT/250ML-% IV SOLN
1300.0000 [IU]/h | INTRAVENOUS | Status: DC
  Administered 2020-10-03: 1200 [IU]/h via INTRAVENOUS
  Filled 2020-10-03 (×3): qty 250

## 2020-10-03 MED ORDER — AMIODARONE HCL IN DEXTROSE 360-4.14 MG/200ML-% IV SOLN: 30.0000 mg/h | INTRAVENOUS | Status: DC

## 2020-10-03 MED ORDER — INSULIN ASPART 100 UNIT/ML ~~LOC~~ SOLN
0.0000 [IU] | Freq: Three times a day (TID) | SUBCUTANEOUS | Status: DC
  Administered 2020-10-03 (×2): 3 [IU] via SUBCUTANEOUS
  Administered 2020-10-04: 5 [IU] via SUBCUTANEOUS
  Administered 2020-10-04: 3 [IU] via SUBCUTANEOUS
  Administered 2020-10-05 (×2): 5 [IU] via SUBCUTANEOUS

## 2020-10-03 MED ORDER — CEFAZOLIN (ANCEF) 1 G IV SOLR
1.0000 g | INTRAVENOUS | Status: AC
  Filled 2020-10-03: qty 1

## 2020-10-03 MED ORDER — MUPIROCIN 2 % EX OINT: 1.0000 "application " | TOPICAL_OINTMENT | Freq: Two times a day (BID) | CUTANEOUS | Status: DC

## 2020-10-03 MED ORDER — INSULIN ASPART 100 UNIT/ML ~~LOC~~ SOLN
0.0000 [IU] | Freq: Every day | SUBCUTANEOUS | Status: DC
  Administered 2020-10-03 – 2020-10-09 (×7): 2 [IU] via SUBCUTANEOUS
  Administered 2020-10-11: 3 [IU] via SUBCUTANEOUS
  Administered 2020-10-12: 2 [IU] via SUBCUTANEOUS

## 2020-10-03 MED ORDER — SODIUM CHLORIDE 0.9% IV SOLUTION: Freq: Once | INTRAVENOUS | Status: DC

## 2020-10-03 MED ORDER — NOREPINEPHRINE 4 MG/250ML-% IV SOLN
3.0000 ug/min | INTRAVENOUS | Status: DC
  Administered 2020-10-03 – 2020-10-04 (×2): 3 ug/min via INTRAVENOUS
  Filled 2020-10-03 (×2): qty 250

## 2020-10-03 NOTE — Progress Notes (Signed)
[place] for heparin Indication: atrial fibrillation  Allergies  Allergen Reactions  . Meclizine Hcl Anaphylaxis and Swelling  . Ivabradine Nausea Only    Patient Measurements: Height: 5' 10.5" (179.1 cm) Weight: 88.5 kg (195 lb 1.6 oz) IBW/kg (Calculated) : 74.15 Heparin Dosing Weight: 88.5 kg   Vital Signs: Temp: 98.2 F (36.8 C) (02/28 2000) Temp Source: Oral (02/28 2000) BP: 115/80 (02/28 2000) Pulse Rate: 110 (02/28 2030)  Labs: Recent Labs    10/01/20 0432 10/02/20 0600 10/02/20 0637 10/03/20 0510 10/03/20 2112 10/03/20 2200  HGB 12.0*  --  11.6* 11.7*  --   --   HCT 39.1  --  36.7* 38.5*  --   --   PLT 182  --  190 177  --   --   APTT  --   --   --   --   --  60*  HEPARINUNFRC  --   --   --   --  >2.20*  --   CREATININE 2.10* 2.25*  --  2.46*  --  2.48*    Estimated Creatinine Clearance: 33.7 mL/min (A) (by C-G formula based on SCr of 2.48 mg/dL (H)).   Assessment: 60 y.o. male with h/o Afib, Eliquis on hold, for heparin  Goal of Therapy:  APTT 66-102 sec Heparin level 0.3-0.7 units/ml Monitor platelets by anticoagulation protocol: Yes   Plan:  Increase Heparin 1300 units/hr Follow-up am labs.  Phillis Knack, PharmD, BCPS

## 2020-10-03 NOTE — Progress Notes (Signed)
MCS EDUCATION NOTE:    VAD education continued this am with patient, son, and brother after lengthy discussion between patient and Dr. Aundra Dubin earlier this morning. Pt says he is now ready to move forward with VAD implant if he is a candidate.  Explained to all that Dr. Aundra Dubin plans on Impella implant to assess for improvement of kidney function with temporary support. We will work on finishing VAD evaluation.    Panorex showed some dental concerns so dental consult has been placed with Dr. Benson Norway. Patient reports that Dr. Cyndia Bent wants his teeth addressed if possible prior to VAD implant.   Provided brief equipment overview and demonstration with HeartMate III training loop including discussion on the following:   a) mobile power unit b) system controller   c) universal Charity fundraiser   d) battery clips   e) Batteries   f)  Perc lock   g) Percutaneous lead   Demonstrated and discussed:  a) changing power source on system controller from tethered (MPU) to untethered (battery) mode   b) changing power source on system controller from untethered (battery) to tethered (MPU) mode   c) how to monitor battery life both on the system controller and on each individual battery   d) changing batteries   Reviewed and supplied a copy of home inspection check list stressing that only three pronged grounded power outlets can be used for VAD equipment. Patient confirmed home has electrical outlets that will support the equipment along with access working telephone.  Identified the following lifestyle modifications while living on [facility]:    1. No driving for at least 6 weeks and then only if doctor gives permission to do so.   2. No tub baths while pump implanted, and shower only when doctor gives permission.   3. No swimming or submersion in water while implanted with pump.   4. No contact sports or engaging in jumping activities.   5. Always have a backup controller, charged spare batteries, and  battery clips nearby at all times in case of emergency.   6. Plan to sleep only when connected to the power module.   7. Exit site care including dressing changes, monitoring for infection, and importance of keeping percutaneous lead stabilized at all times.     Offered to have one of our current patient come and meet with all three this am. They sais it was not necessary after completion of this hands on education. Son has to go to work and has time limitations.    Reviewed pictures of VAD drive line, site care, dressing changes, and drive line stabilization including securement attachment device and abdominal binder. Discussed with pt and family that they will be required to purchase dressing supplies as long as patient has the VAD in place.    Zada Girt, RN VAD Coordinator   Office: [phone] 24/7 VAD Pager: [phone]

## 2020-10-03 NOTE — Care Management Important Message (Signed)
Important Message  Patient Details  Name: Marcus Johnson. MRN: [id number] Date of Birth: [birth year]   Medicare Important Message Given:  Yes     Brennden, Masten 10/03/2020, 9:35 AM

## 2020-10-03 NOTE — Progress Notes (Signed)
   Moved to ICU   CO-OX 54% on dobutamine 7.5 mcg. Add norepi 3 mcg now.   On the monitor he is tachy with PVCs/PACs.   EKG --> Sinus Tach PACs. Continue po amio for now.      Curley Hogen NP-C  3:32 PM

## 2020-10-03 NOTE — Progress Notes (Signed)
Inpatient Diabetes Program Recommendations  AACE/ADA: New Consensus Statement on Inpatient Glycemic Control (2015)  Target Ranges:  Prepandial:   less than 140 mg/dL      Peak postprandial:   less than 180 mg/dL (1-2 hours)      Critically ill patients:  140 - 180 mg/dL   Lab Results  Component Value Date   GLUCAP 207 (H) 10/03/2020   HGBA1C 7.7 (H) 09/29/2020    Review of Glycemic Control Results for Marcus Johnson, Marcus Johnson (MRN [id number]) as of 10/03/2020 11:31  Ref. Range 10/02/2020 17:09 10/02/2020 21:23 10/03/2020 06:14  Glucose-Capillary Latest Ref Range: 70 - 99 mg/dL 189 (H) 236 (H) 207 (H)   Diabetes history: Type 2 DM Outpatient Diabetes medications: Jardiance 10 mg QD, Novolog 5 units TID, Lantus 50 units QHS Current orders for Inpatient glycemic control: Novolog 0-15 units TID, Novolog 0-5 units QHS  Inpatient Diabetes Program Recommendations:    Consider adding Lantus 12 units QD.   Thanks, Bronson Curb, MSN, RNC-OB Diabetes Coordinator [phone] (8a-5p)

## 2020-10-03 NOTE — Progress Notes (Signed)
Lower Extremity Dopplers completed.    Leavy Cella, [place], [place]

## 2020-10-03 NOTE — Progress Notes (Addendum)
   Palliative Medicine Inpatient Follow Up Note  Reason for consult:  LVAD Evaluation  HPI:  Per intake H&P --> Darek Eifler. is a 60 y.o. male who has a h/o DM2, CAD s/p CABG 6808, systolic HF due to ischemic cardiomyopathy with EF 20-25% (echo 12/15), DM2 and CKD. He is s/p Medtronic single chamber ICD.  Kameran was seen by the PMT in 2019 at this time there was discussion of his poor cardiac function. Then LVAD was brought up in conversation and he was not at a point where he wanted to entertain this thought nor pursue it.   The Palliative medicine team has been asked to aid in evaluation of Tywan for LVAD readiness.  Today's Discussion (10/03/2020):  *Please note that this is a verbal dictation therefore any spelling or grammatical errors are due to the "[place]" system interpretation.  Chart reviewed.  I visited with Kennith this afternoon. He shared with me that he had met with his son and brother this morning and that some big decisions had been made. We reviewed that he would now like to pursue LVAD insertion. He kindly apologized to me for his feelings on Saturday which I stated understanding of given the extreme stress this situation has caused him to endure. We reviewed that maybe he needed the situation described as it had been to recognize the importance of this intervention/therapy.    I told Hernando that the Palliative team will remain involved as a layer of support for him and his family during this time. I expressed that if he needs or desires anything additional from our team we will be made available.  As it sits presently he has been transitioned to [facility] for LVAD workup and additional supportive measures and impella insertion by CTS.  Questions answered  SUMMARY OF RECOMMENDATIONS   Full Code / Full Scope of Treatment  Patient would now like to progress with workup for LVAD insertion and therapy  From a Palliative perspective Versie has excellent  social support of both his son, [name] and brother, [name] a goals of care note will be completed in [place] to reflect them as his decision makers  PMT will incrementally follow along, pls do not hesitate to call the team directly if additional support is needed  Time Spent: 15 Greater than 50% of the time was spent in counseling and coordination of care ______________________________________________________________________________________ [place] Team Team Cell Phone: [phone] Please utilize secure chat with additional questions, if there is no response within 30 minutes please call the above phone number  Palliative Medicine Team providers are available by phone from 7am to 7pm daily and can be reached through the team cell phone.  Should this patient require assistance outside of these hours, please call the patient's attending physician.

## 2020-10-03 NOTE — Progress Notes (Addendum)
Patient ID: Marcus Johnson., male   DOB: 10/08/1960, 60 y.o.   MRN: [id number]     Advanced Heart Failure Rounding Note  PCP-Cardiologist: No primary care provider on file.   Subjective:    2/27 Milrinone switched to dobutamine 5 mcg.   Dobutamine 5 mcg with CO-OX 57.5%.   Lasix stopped yesterday due to rising creatinine 1.5 -> 1.7 -> 1.8 -> 2.1->2.45   Complaining of nausea.   Echo: EF 20-25%, normal RV, PASP 61, dilated IVC.    Objective:   Weight Range: 88.5 kg Body mass index is 27.6 kg/m.   Vital Signs:   Temp:  [98.1 F (36.7 C)-99.1 F (37.3 C)] 98.5 F (36.9 C) (02/28 0530) Pulse Rate:  [113] 113 (02/27 1026) Resp:  [17-19] 17 (02/28 0530) BP: (99-112)/(54-71) 104/71 (02/28 0530) SpO2:  [100 %] 100 % (02/28 0530) Weight:  [88.5 kg] 88.5 kg (02/28 0555) Last BM Date: 10/02/20  Weight change: Filed Weights   10/02/20 0436 10/02/20 2300 10/03/20 0555  Weight: 87.9 kg 88.5 kg 88.5 kg    Intake/Output:   Intake/Output Summary (Last 24 hours) at 10/03/2020 0812 Last data filed at 10/03/2020 0533 Gross per 24 hour  Intake 1579.17 ml  Output 350 ml  Net 1229.17 ml      Physical Exam   CVP 11-12  General:  Appears weak. Sitting on the side of the bed.  No resp difficulty HEENT: normal Neck: supple. no JVD. Carotids 2+ bilat; no bruits. No lymphadenopathy or thryomegaly appreciated. Cor: PMI nondisplaced. Regular rate & rhythm. No rubs, or murmurs. +S3  Lungs: clear Abdomen: soft, nontender, nondistended. No hepatosplenomegaly. No bruits or masses. Good bowel sounds. Extremities: no cyanosis, clubbing, rash, edema> RUE PICC  Neuro: alert & orientedx3, cranial nerves grossly intact. moves all 4 extremities w/o difficulty. Affect pleasant   Telemetry  ST 100s with occasional PVCs  Labs    CBC Recent Labs    09/30/20 1437 10/01/20 0432 10/02/20 0637 10/03/20 0510  WBC 8.1   < > 8.2 6.8  NEUTROABS 4.9  --   --   --   HGB 11.6*   < >  11.6* 11.7*  HCT 38.6*   < > 36.7* 38.5*  MCV 80.9   < > 78.9* 80.5  PLT 174   < > 190 177   < > = values in this interval not displayed.   Basic Metabolic Panel Recent Labs    10/02/20 0600 10/03/20 0510  NA 136 136  K 3.6 3.8  CL 89* 90*  CO2 33* 31  GLUCOSE 208* 201*  BUN 59* 56*  CREATININE 2.25* 2.46*  CALCIUM 9.6 9.3  MG  --  2.3   Liver Function Tests No results for input(s): AST, ALT, ALKPHOS, BILITOT, PROT, ALBUMIN in the last 72 hours. No results for input(s): LIPASE, AMYLASE in the last 72 hours. Cardiac Enzymes No results for input(s): CKTOTAL, CKMB, CKMBINDEX, TROPONINI in the last 72 hours.  BNP: BNP (last 3 results) No results for input(s): BNP in the last 8760 hours.  ProBNP (last 3 results) No results for input(s): PROBNP in the last 8760 hours.   D-Dimer No results for input(s): DDIMER in the last 72 hours. Hemoglobin A1C No results for input(s): HGBA1C in the last 72 hours. Fasting Lipid Panel Recent Labs    09/30/20 1920  CHOL 103  HDL 29*  LDLCALC 59  TRIG 76  CHOLHDL 3.6   Thyroid Function Tests No results for input(s):  TSH, T4TOTAL, T3FREE, THYROIDAB in the last 72 hours.  Invalid input(s): FREET3  Other results:   Imaging    No results found.   Medications:     Scheduled Medications: . amiodarone  200 mg Oral BID  . apixaban  5 mg Oral BID  . Chlorhexidine Gluconate Cloth  6 each Topical Daily  . digoxin  0.125 mg Oral Daily  . insulin aspart  0-15 Units Subcutaneous TID WC  . multivitamin with minerals  1 tablet Oral Daily  . rosuvastatin  40 mg Oral Daily  . sodium chloride flush  10-40 mL Intracatheter Q12H    Infusions: . sodium chloride Stopped (09/28/20 1746)  . DOBUTamine 5 mcg/kg/min (10/02/20 1430)    PRN Medications: sodium chloride, acetaminophen, ondansetron (ZOFRAN) IV, ondansetron, sodium chloride flush    Assessment/Plan   1. COVID-19 infection:in 1/22. Manifested primarily as  nausea/vomiting. Very slow recovery, did not take meds for at least 3 wks but had recently started back on meds just prior to admission.   - Suspect that nausea/poor appetite was actually due to HF.  2.Acute on chronic systolic HF:ICM.HasMedtronic ICD. Narrow QRS, not CRT candidate. Has required milrinone multiple times in the past for low output HF. Echo 6/19 with EF 20% and moderately decreased RV systolic function.CPX 4/19 with severe functional limitation due to [facility] 6/19 showed low output and coronary angiography showed no interventional targets.Echo in 4/21 showed EF 25%, mildly decreased RV systolic function. CPX in 5/21 showed severe HF limitation. He has been resistant to LVAD/transplant evaluation, both offered with discussions in past. I worry that we will miss the window for advanced therapies. He was not interested in barostimulatoractivation therapy. History of cardiorenal syndrome/diabetic nephropathy, creatinine up to 2.33 in 1/22, likely due to dehydration with nausea/vomiting.He had been off meds for several weeks, just restarted 3-4 days PTA. He remains volume overloaded with low output HF. Milrinone increased to 0.375 on 2/26. On 2/27 switched from milrinone to dobutamine 5 mcg. CO-OX today 57.5%.  CVP 11-12. Volume status elevated. Renal function worsening.  -Continue digoxin 0.125 daily, dig level 0.5  - Off carvedilol with shock - Will hold spiro with ongoing AKI - Off empagliflozin for now with AKI  - Off Entresto with low BP  2. CAD s/p CABG2002:Last cath in 6/19 with patent grafts. No s/s ischemia  -No ASA given apixaban use.  - Continue Crestor 40 mg daily.  3. AKI on CKD Stage 3b: I suspect that this is a combination of cardiorenal syndrome and diabetic nephropathy and possibly ATN. Creatinine continues to rise despite improved co-ox 1.8-> 2.1 -> 2.25->2.5   - continue hemodynamic support  - need to keep MAPs > 70. Can add NE as needed 4.  GL:OVFIEPP of very poor control but hgbA1ctrending down. Most recent 7.7%  - Cover with SSI. 5. Atrial flutter: s/p DC-CV 08/21/18.He is in NSR currently but note runs of ?flutter vs fibrillation on device interrogation. Very frequent PACs on telemetry -> confirmed on ECG 2/25 - Continue amiodarone 200 mg bid.   - Continue Eliquis.  Discussed transfer to ICU for Impella. He is considering. Palliative Care following.    Length of Stay: 5  Amy Clegg, NP  10/03/2020, 8:12 AM  Advanced Heart Failure Team Pager [phone] (M-F; 7a - 4p)  Please contact [facility] for night-coverage after hours (4p -7a ) and weekends on [email or web address]  Patient seen with NP, agree with the above note.   Creatinine worsening, 2.45, despite switch to  dobutamine.  Marginal co-ox at 57% on dobutamine 5.  CVP rising again to 11-12 and developing nausea.   General: NAD Neck: JVP 10-12, no thyromegaly or thyroid nodule.  Lungs: Clear to auscultation bilaterally with normal respiratory effort. CV: Lateral PMI.  Heart regular S1/S2, soft S3, no murmur.  No peripheral edema.   Abdomen: Soft, nontender, no hepatosplenomegaly, no distention.  Skin: Intact without lesions or rashes.  Neurologic: Alert and oriented x 3.  Psych: Normal affect. Extremities: No clubbing or cyanosis.  HEENT: Normal.   End stage CHF, failing inotropes at this point with rising creatinine.  RV function acceptable by echo.  CVP rising again.  Long discussion today with patient and his son.  I presented options of continued medical management and likely progressing failure and longevity of a few weeks at maximum (this would be a hospice/palliative care situation) versus proceeding with Impella 5.5 catheter and trying to optimize him for LVAD later this admission.  Would need improvement in creatinine.  He has had a difficult decision but clearly says at this time that he would like to work towards [place]. Co-ox marginal at 57%.  - Discussed  with Dr. Cyndia Bent, plan for Impella 5.5 tomorrow afternoon (needs Eliquis wash-out).  - Increase dobutamine to 7.5 and stop digoxin with creatinine rise.  - Will additionally add NE 3 to try to stabilize him prior to Impella placement.  - Will not give diuretics yet with creatinine rise, reassess CVP in pm.  - LVAD coordinator to see him again today, will need dental consult. If we can stabilize him on Impella with improved creatinine, will plan LVAD this admission.   Rhythm still appears to be NSR with PACs. Continue po amiodarone.   CRITICAL CARE Performed by: Loralie Champagne  Total critical care time: 35 minutes  Critical care time was exclusive of separately billable procedures and treating other patients.  Critical care was necessary to treat or prevent imminent or life-threatening deterioration.  Critical care was time spent personally by me on the following activities: development of treatment plan with patient and/or surrogate as well as nursing, discussions with consultants, evaluation of patient's response to treatment, examination of patient, obtaining history from patient or surrogate, ordering and performing treatments and interventions, ordering and review of laboratory studies, ordering and review of radiographic studies, pulse oximetry and re-evaluation of patient's condition.   Loralie Champagne 10/03/2020 8:59 AM

## 2020-10-03 NOTE — Progress Notes (Signed)
MCS EDUCATION NOTE:                VAD evaluation consent reviewed and signed by patient; no caregiver present at this time. Ongoing VAD teaching with patient. He says he has read "everything" about the LVAD and remembers speaking with VAD team in the past on several occasions. Says he's still "not sure" about getting the VAD.   VAD educational packet including "Understanding Your Options with Advanced Heart Failure", "[place] Patient Agreement for VAD Evaluation and Potential Implantation" consent, and [employer] "Living a More Active Life" HM III booklet", "[place] HM III Patient Education", "[place] Mechanical Circulatory Support Program", and "Decision Aids for Left Ventricular Assist Device" reviewed in detail and left at bedside for continued reference.  All questions answered regarding VAD implant, hospital stay, and what to expect when discharged home living with a heart pump. Pt identified his son and his brother as his primary caregivers if this therapy should be deemed appropriate  Explained need for 24/7 care when pt is discharged home due to sternal precautions, adaptation to living on support, emotional support, consistent and meticulous exit site care and management, medication adherence and high volume of follow up visits with the [facility] after discharge; both pt and caregiver verbalized understanding of above.   Explained that LVAD can be implanted for two indications in the setting of advanced left ventricular heart failure treatment:  1. Bridge to transplant - used for patients who cannot safely wait for heart transplant without this device.  Or    2. Destination therapy - used for patients until end of life or recovery of heart function.  Patient acknowledges that the indication at this point in time for LVAD therapy would be for Destination Therapy at this time due to severity of his illness.    Reinforced need for 24 hour/7 day week caregivers; pt designated his son  and brother as caregivers. Patient will also need to abide by sternal precautions with no lifting >10lbs, pushing, pulling and will need assistance with adapting to new life style with VAD equipment and care.   Intermacs patient survival statistics through September 2021 reviewed with patient and caregiver as follows:                                                The patient understands that from this discussion it does not mean that they will receive the device, but that depends on an extensive evaluation process. The patient is aware of the fact that if at anytime they want to stop the evaluation process they can.  All questions have been answered at this time and contact information was provided should they encounter any further questions.  He is agreeable at this time to the evaluation process and will move forward.    Zada Girt, RN VAD Coordinator   Office: [phone] 24/7 VAD Pager: [phone]

## 2020-10-03 NOTE — Progress Notes (Signed)
Pre Vad Dopplers completed.   Leavy Cella, RDCS [place], RVT

## 2020-10-03 NOTE — Progress Notes (Signed)
ANTICOAGULATION CONSULT NOTE - Initial Consult  Pharmacy Consult for heparin Indication: atrial fibrillation  Allergies  Allergen Reactions  . Meclizine Hcl Anaphylaxis and Swelling  . Ivabradine Nausea Only    Patient Measurements: Height: 5' 10.5" (179.1 cm) Weight: 88.5 kg (195 lb 1.6 oz) IBW/kg (Calculated) : 74.15 Heparin Dosing Weight: 88.5 kg   Vital Signs: Temp: 98.9 F (37.2 C) (02/28 1513) Temp Source: Oral (02/28 1513) BP: 104/71 (02/28 0530)  Labs: Recent Labs    10/01/20 0432 10/02/20 0600 10/02/20 0637 10/03/20 0510  HGB 12.0*  --  11.6* 11.7*  HCT 39.1  --  36.7* 38.5*  PLT 182  --  190 177  CREATININE 2.10* 2.25*  --  2.46*    Estimated Creatinine Clearance: 33.9 mL/min (A) (by C-G formula based on SCr of 2.46 mg/dL (H)).   Medical History: Past Medical History:  Diagnosis Date  . AICD (automatic cardioverter/defibrillator) present    Single-chamber  implantable cardiac defibrillator - [employer]  . Atrial fibrillation ([place])   . Cataract    Mixed form OU  . CHF (congestive heart failure) ([place])   . Chronic kidney disease   . Chronic kidney disease (CKD), stage III (moderate) (HCC)   . Chronic systolic heart failure ([place])    a. Echo 5/13: Mild LVE, mild LVH, EF 10%, anteroseptal, lateral, apical AK, mild MR, mild LAE, moderately reduced RVSF, mild RAE, PASP 60;  b. 07/2014 Echo: EF 20-2%, diff HK, AKI of antsep/apical/mid-apicalinferior, mod reduced RV.  Marland Kitchen Coronary artery disease    a. s/p CABG 2002 b. LHC 5/13:  dLM 80%, LAD subtotally occluded, pCFX occluded, pRCA 50%, mid? Occlusion with high take off of the PDA with 70% multiple lesions-not bypassed and supplies collaterals to LAD, LIMA-IM/ramus ok, S-OM ok, S-PLA branches ok. Medical therapy was recommended  . COVID   . Diabetic retinopathy (HCC)    NPDR OD, PDR OS  . Dyspnea   . Gout    "on daily RX" (01/08/2018)  . Hypertension   . Hypertensive retinopathy    OU  . Ischemic  cardiomyopathy    a. Echo 5/13: Mild LVE, mild LVH, EF 10%, anteroseptal, lateral, apical AK, mild MR, mild LAE, moderately reduced RVSF, mild RAE, PASP 60;  b. 01/2012 s/p MDT [id number] Protecta XT VR AICD;  c. 07/2014 Echo: EF 20-2%, diff HK, AKI of antsep/apical/mid-apicalinferior, mod reduced RV.  Marland Kitchen MRSA (methicillin resistant Staphylococcus aureus)    Status post right foot plantar deep infection with MRSA status post  I&D 10/2008  . Myocardial infarction [facility])    "was told I'd had several before heart OR in 2002" (01/08/2018)  . Noncompliance   . Peripheral neuropathy   . Retinopathy, diabetic, background ([place])   . Syncope   . Type II diabetes mellitus ([place])    requiring insulin   . Vitreous hemorrhage, left (HCC)    and proliferative diabetic retinopathy    Medications:  Scheduled:  . amiodarone  200 mg Oral BID  . Chlorhexidine Gluconate Cloth  6 each Topical Daily  . insulin aspart  0-15 Units Subcutaneous TID WC  . insulin aspart  0-5 Units Subcutaneous QHS  . multivitamin with minerals  1 tablet Oral Daily  . rosuvastatin  40 mg Oral Daily  . sodium chloride flush  10-40 mL Intracatheter Q12H    Assessment: Marcus Johnson presenting with volume overload with low output HF - plan now for impella placement while undergoing LVAD w/u. On apixaban PTA for hx Afib -  LD 2/27@2144 .  Hgb 11.7, plt 177. No s/sx of bleeding. Will start heparin infusion now without bolus per discussion with MD.   Goal of Therapy:  Heparin level 0.3-0.7 units/ml Monitor platelets by anticoagulation protocol: Yes   Plan:  Start heparin infusion at 1200 units/hr Order heparin level 6 hours Monitor heparin level, CBC, and for s/sx of bleeding  Antonietta Jewel, PharmD, [place] Pharmacist  Phone: [phone] 10/03/2020 3:32 PM  Please check AMION for all [place] phone numbers After 10:00 PM, call [place] [phone]

## 2020-10-03 NOTE — Consult Note (Signed)
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  3.00 cm   MITRAL VALVE        TRICUSPID VALVE  MV Area (PHT): 5.38 cm  TR Peak grad:  46.0 mmHg  MV Decel Time: 141 msec  TR Vmax:    339.00 cm/s  MV E velocity: 90.80 cm/s  MV A velocity: 25.70 cm/s SHUNTS  MV E/A ratio: 3.53    Systemic VTI: 0.10 m               Systemic Diam: 1.90 cm   Candee Furbish MD  Electronically signed by Candee Furbish MD  Signature Date/Time: 09/29/2020/11:25:13 AM     CLINICAL DATA:  Preoperative evaluation prior to planned ventricular assist device placement.  EXAM: CT CHEST, ABDOMEN AND PELVIS WITHOUT  CONTRAST  TECHNIQUE: Multidetector CT imaging of the chest, abdomen and pelvis was performed following the standard protocol without IV contrast.  COMPARISON:  CT 01/11/2018.  FINDINGS: CT CHEST FINDINGS  Cardiovascular: Diffuse three-vessel coronary artery atherosclerosis with lesser involvement of the aorta and great vessel status post median sternotomy and CABG. Right arm PICC projects to the superior cavoatrial junction. Left subclavian pacemaker leads appear unchanged. There is stable mild cardiomegaly. No significant pericardial fluid.  Mediastinum/Nodes: There are no enlarged mediastinal, hilar or axillary lymph nodes. Small mediastinal lymph nodes are stable. Hilar assessment is limited by the lack of intravenous contrast, although the hilar contours appear unchanged. The thyroid gland, trachea and esophagus demonstrate no significant findings.  Lungs/Pleura: No pleural effusion or pneumothorax. The lungs are clear. There are probable mild centrilobular emphysematous changes.  Musculoskeletal/Chest wall: No chest wall mass or suspicious osseous findings. Mild asymmetric left-sided gynecomastia.  CT ABDOMEN AND PELVIS FINDINGS  Hepatobiliary: The liver is normal in density without suspicious focal abnormality. Dependent small gallstones or high density bile in the gallbladder, similar to previous study. No gallbladder wall thickening, surrounding inflammation or biliary dilatation.  Pancreas: Unremarkable. No pancreatic ductal dilatation or surrounding inflammatory changes.  Spleen: Normal in size without focal abnormality.  Adrenals/Urinary Tract: Both adrenal glands appear normal. Renal vascular calcifications and symmetric perinephric soft tissue stranding bilaterally are stable. No evidence of urinary tract calculus or hydronephrosis. The bladder appears unremarkable.  Stomach/Bowel: The stomach appears unremarkable for its degree of distension.  No evidence of bowel wall thickening, distention or surrounding inflammatory change. The appendix appears normal.  Vascular/Lymphatic: There are no enlarged abdominal or pelvic lymph nodes. Stable small lymph nodes in the porta hepatis. Diffuse aortic and branch vessel atherosclerosis without acute vascular findings on noncontrast imaging.  Reproductive: Stable mild enlargement of the prostate gland.  Other: No evidence of abdominal wall mass or hernia. No ascites.  Musculoskeletal: No acute or significant osseous findings.  IMPRESSION: 1. No acute findings or significant changes from previous study. 2. Cholelithiasis without evidence of cholecystitis or biliary dilatation. 3. Cardiomegaly post CABG.  Aortic Atherosclerosis (ICD10-I70.0).   Electronically Signed   By: Richardean Sale M.D.   On: 09/30/2020 17:42   Narrative & Impression  CLINICAL DATA:  Preoperative evaluation prior to planned ventricular assist device placement.  EXAM: CT CHEST, ABDOMEN AND PELVIS WITHOUT CONTRAST  TECHNIQUE: Multidetector CT imaging of the chest, abdomen and pelvis was performed following the standard protocol without IV contrast.  COMPARISON:  CT 01/11/2018.  FINDINGS: CT CHEST FINDINGS  Cardiovascular: Diffuse three-vessel coronary artery atherosclerosis with lesser involvement of the aorta and great vessel status post median sternotomy and CABG. Right arm PICC projects to the superior cavoatrial junction. Left subclavian pacemaker leads appear unchanged.  There is stable mild cardiomegaly. No significant pericardial fluid.  Mediastinum/Nodes: There are no enlarged mediastinal, hilar or axillary lymph nodes. Small mediastinal lymph nodes are stable. Hilar assessment is limited by the lack of intravenous contrast, although the hilar contours appear unchanged. The thyroid gland, trachea and esophagus demonstrate no significant findings.  Lungs/Pleura: No pleural  effusion or pneumothorax. The lungs are clear. There are probable mild centrilobular emphysematous changes.  Musculoskeletal/Chest wall: No chest wall mass or suspicious osseous findings. Mild asymmetric left-sided gynecomastia.  CT ABDOMEN AND PELVIS FINDINGS  Hepatobiliary: The liver is normal in density without suspicious focal abnormality. Dependent small gallstones or high density bile in the gallbladder, similar to previous study. No gallbladder wall thickening, surrounding inflammation or biliary dilatation.  Pancreas: Unremarkable. No pancreatic ductal dilatation or surrounding inflammatory changes.  Spleen: Normal in size without focal abnormality.  Adrenals/Urinary Tract: Both adrenal glands appear normal. Renal vascular calcifications and symmetric perinephric soft tissue stranding bilaterally are stable. No evidence of urinary tract calculus or hydronephrosis. The bladder appears unremarkable.  Stomach/Bowel: The stomach appears unremarkable for its degree of distension. No evidence of bowel wall thickening, distention or surrounding inflammatory change. The appendix appears normal.  Vascular/Lymphatic: There are no enlarged abdominal or pelvic lymph nodes. Stable small lymph nodes in the porta hepatis. Diffuse aortic and branch vessel atherosclerosis without acute vascular findings on noncontrast imaging.  Reproductive: Stable mild enlargement of the prostate gland.  Other: No evidence of abdominal wall mass or hernia. No ascites.  Musculoskeletal: No acute or significant osseous findings.  IMPRESSION: 1. No acute findings or significant changes from previous study. 2. Cholelithiasis without evidence of cholecystitis or biliary dilatation. 3. Cardiomegaly post CABG.  Aortic Atherosclerosis (ICD10-I70.0).   Electronically Signed   By: Richardean Sale M.D.   On: 09/30/2020 17:42     CLINICAL DATA:  Pre LVAD evaluation to rule out  surgical contraindication.  EXAM: ORTHOPANTOGRAM/PANORAMIC  COMPARISON:  01/08/2018  FINDINGS: There are extensive caries, with caries destruction of numerous teeth in the maxilla and mandible. No evidence for periapical abscess or sinus fluid.  IMPRESSION: Multiple caries, with destruction of numerous teeth in the maxilla and mandible.   Electronically Signed   By: Nolon Nations M.D.   On: 09/30/2020 16:52   Assessment/Plan:  1.  Acute on chronic systolic congestive heart failure due to ischemic cardiomyopathy with ejection fraction of 20 to 25% admitted with class IIIb symptoms.  He has had multiple prior admissions for milrinone therapy and IV diuresis for low output heart failure.  Previous CPX in 2019 showed severe functional limitation due to heart failure.  He was started on milrinone with escalation of the dose without improvement in his Co-ox and rising creatinine.  He was transitioned to dobutamine 7.5.  With an improvement in his Co-ox to 57.5 this morning.  His creatinine has risen to 2.5 from 1.8.  He has been hesitant to agree to LVAD therapy in the past but after a long discussion this morning with his son and brother with Dr. Aundra Dubin he has decided to proceed ahead.  Dr. Aundra Dubin and I discussed the case and feel that insertion of an Impella 5.5 would be the best initial treatment for hemodynamic support to see if his creatinine improves.  His last dose of Eliquis was last night and therefore I will plan to insert a right axillary Impella tomorrow afternoon.  If his creatinine stabilizes and improves then we could proceed with LVAD therapy in the near future.  I discussed the operative procedure of right axillary Impella insertion including alternatives, benefits, and risks including but not limited to bleeding, blood transfusion, vascular injury, cardiac perforation, stroke, and death.  He understands and agrees to proceed.  2.  Stage III chronic kidney disease due  to cardiorenal syndrome and diabetic nephropathy.  His baseline creatinine over the past year seems to be around 1.8.  3.  Coronary disease status post CABG in 2002 with patent grafts on his last cath.  4.  History of poorly controlled diabetes mellitus with his most recent hemoglobin A1c trending down to 7.7 from a high of 15.7 last April.  5.  History of atrial flutter   I spent 60 minutes performing this consultation and > 50% of this time was spent face to face counseling and coordinating the care of this patient's acute on chronic systolic congestive heart failure.  Gaye Pollack 10/03/2020, 2:51 PM

## 2020-10-03 NOTE — Progress Notes (Signed)
  Discussed him and his family.    He is agreeable to transfer to [place]. Plan to start norepi once he gets there.   LVAD workup initiated.   Rahmel Nedved NP-C  10:20 AM

## 2020-10-04 ENCOUNTER — Inpatient Hospital Stay (HOSPITAL_COMMUNITY): Payer: Medicare HMO

## 2020-10-04 ENCOUNTER — Inpatient Hospital Stay (HOSPITAL_COMMUNITY): Payer: Medicare HMO | Admitting: Anesthesiology

## 2020-10-04 ENCOUNTER — Encounter (HOSPITAL_COMMUNITY)
Admission: AD | Disposition: A | Discharge status: Rehabilitation Facility | Payer: Self-pay | Source: Home / Self Care | Attending: Surgery

## 2020-10-04 DIAGNOSIS — J810 Acute pulmonary edema: Secondary | ICD-10-CM | POA: Diagnosis not present

## 2020-10-04 DIAGNOSIS — R0989 Other specified symptoms and signs involving the circulatory and respiratory systems: Secondary | ICD-10-CM | POA: Diagnosis not present

## 2020-10-04 DIAGNOSIS — I081 Rheumatic disorders of both mitral and tricuspid valves: Secondary | ICD-10-CM | POA: Diagnosis not present

## 2020-10-04 DIAGNOSIS — I5043 Acute on chronic combined systolic (congestive) and diastolic (congestive) heart failure: Secondary | ICD-10-CM | POA: Diagnosis not present

## 2020-10-04 DIAGNOSIS — I5023 Acute on chronic systolic (congestive) heart failure: Secondary | ICD-10-CM | POA: Diagnosis not present

## 2020-10-04 DIAGNOSIS — Z4682 Encounter for fitting and adjustment of non-vascular catheter: Secondary | ICD-10-CM | POA: Diagnosis not present

## 2020-10-04 DIAGNOSIS — N183 Chronic kidney disease, stage 3 unspecified: Secondary | ICD-10-CM | POA: Diagnosis not present

## 2020-10-04 DIAGNOSIS — I130 Hypertensive heart and chronic kidney disease with heart failure and stage 1 through stage 4 chronic kidney disease, or unspecified chronic kidney disease: Secondary | ICD-10-CM | POA: Diagnosis not present

## 2020-10-04 HISTORY — PX: TEE WITHOUT CARDIOVERSION: SHX5443

## 2020-10-04 HISTORY — PX: PLACEMENT OF IMPELLA LEFT VENTRICULAR ASSIST DEVICE: SHX6519

## 2020-10-04 LAB — BASIC METABOLIC PANEL
Anion gap: 16 — ABNORMAL HIGH (ref 5–15)
Anion gap: 15 (ref 5–15)
BUN: 50 mg/dL — ABNORMAL HIGH (ref 6–20)
BUN: 48 mg/dL — ABNORMAL HIGH (ref 6–20)
CO2: 27 mmol/L (ref 22–32)
CO2: 27 mmol/L (ref 22–32)
Calcium: 9.2 mg/dL (ref 8.9–10.3)
Calcium: 9.1 mg/dL (ref 8.9–10.3)
Chloride: 88 mmol/L — ABNORMAL LOW (ref 98–111)
Chloride: 95 mmol/L — ABNORMAL LOW (ref 98–111)
Creatinine, Ser: 2.4 mg/dL — ABNORMAL HIGH (ref 0.61–1.24)
Creatinine, Ser: 2.47 mg/dL — ABNORMAL HIGH (ref 0.61–1.24)
GFR, Estimated: 30 mL/min — ABNORMAL LOW (ref >60)
GFR, Estimated: 29 mL/min — ABNORMAL LOW (ref >60)
Glucose, Bld: 366 mg/dL — ABNORMAL HIGH (ref 70–99)
Glucose, Bld: 215 mg/dL — ABNORMAL HIGH (ref 70–99)
Potassium: 3.7 mmol/L (ref 3.5–5.1)
Potassium: 3.7 mmol/L (ref 3.5–5.1)
Sodium: 131 mmol/L — ABNORMAL LOW (ref 135–145)
Sodium: 137 mmol/L (ref 135–145)

## 2020-10-04 LAB — CBC
HCT: 37 % — ABNORMAL LOW (ref 39.0–52.0)
HCT: 37.8 % — ABNORMAL LOW (ref 39.0–52.0)
Hemoglobin: 11.4 g/dL — ABNORMAL LOW (ref 13.0–17.0)
Hemoglobin: 11.2 g/dL — ABNORMAL LOW (ref 13.0–17.0)
MCH: 25.2 pg — ABNORMAL LOW (ref 26.0–34.0)
MCH: 24.5 pg — ABNORMAL LOW (ref 26.0–34.0)
MCHC: 30.8 g/dL (ref 30.0–36.0)
MCHC: 29.6 g/dL — ABNORMAL LOW (ref 30.0–36.0)
MCV: 81.7 fL (ref 80.0–100.0)
MCV: 82.7 fL (ref 80.0–100.0)
Platelets: 180 10*3/uL (ref 150–400)
Platelets: 173 10*3/uL (ref 150–400)
RBC: 4.53 MIL/uL (ref 4.22–5.81)
RBC: 4.57 MIL/uL (ref 4.22–5.81)
RDW: 20.5 % — ABNORMAL HIGH (ref 11.5–15.5)
RDW: 20.3 % — ABNORMAL HIGH (ref 11.5–15.5)
WBC: 6.7 10*3/uL (ref 4.0–10.5)
WBC: 7.7 10*3/uL (ref 4.0–10.5)
nRBC: 0 % (ref 0.0–0.2)
nRBC: 0 % (ref 0.0–0.2)

## 2020-10-04 LAB — POCT I-STAT, CHEM 8
BUN: 43 mg/dL — ABNORMAL HIGH (ref 6–20)
Calcium, Ion: 1.14 mmol/L — ABNORMAL LOW (ref 1.15–1.40)
Chloride: 96 mmol/L — ABNORMAL LOW (ref 98–111)
Creatinine, Ser: 2.3 mg/dL — ABNORMAL HIGH (ref 0.61–1.24)
Glucose, Bld: 190 mg/dL — ABNORMAL HIGH (ref 70–99)
HCT: 38 % — ABNORMAL LOW (ref 39.0–52.0)
Hemoglobin: 12.9 g/dL — ABNORMAL LOW (ref 13.0–17.0)
Potassium: 3.5 mmol/L (ref 3.5–5.1)
Sodium: 136 mmol/L (ref 135–145)
TCO2: 28 mmol/L (ref 22–32)

## 2020-10-04 LAB — GLUCOSE, CAPILLARY
Glucose-Capillary: 212 mg/dL — ABNORMAL HIGH (ref 70–99)
Glucose-Capillary: 245 mg/dL — ABNORMAL HIGH (ref 70–99)
Glucose-Capillary: 186 mg/dL — ABNORMAL HIGH (ref 70–99)
Glucose-Capillary: 193 mg/dL — ABNORMAL HIGH (ref 70–99)
Glucose-Capillary: 89 mg/dL (ref 70–99)
Glucose-Capillary: 206 mg/dL — ABNORMAL HIGH (ref 70–99)

## 2020-10-04 LAB — COOXEMETRY PANEL
Carboxyhemoglobin: 2 % — ABNORMAL HIGH (ref 0.5–1.5)
Carboxyhemoglobin: 2 % — ABNORMAL HIGH (ref 0.5–1.5)
Methemoglobin: 0.8 % (ref 0.0–1.5)
Methemoglobin: 0.8 % (ref 0.0–1.5)
O2 Saturation: 64.4 %
O2 Saturation: 69.2 %
Total hemoglobin: 11.7 g/dL — ABNORMAL LOW (ref 12.0–16.0)
Total hemoglobin: 11.4 g/dL — ABNORMAL LOW (ref 12.0–16.0)

## 2020-10-04 LAB — POCT ACTIVATED CLOTTING TIME: Activated Clotting Time: 344 seconds

## 2020-10-04 LAB — HEPARIN LEVEL (UNFRACTIONATED): Heparin Unfractionated: 2.08 IU/mL — ABNORMAL HIGH (ref 0.30–0.70)

## 2020-10-04 LAB — APTT: aPTT: 65 seconds — ABNORMAL HIGH (ref 24–36)

## 2020-10-04 LAB — MAGNESIUM: Magnesium: 2.7 mg/dL — ABNORMAL HIGH (ref 1.7–2.4)

## 2020-10-04 SURGERY — INSERTION, CARDIAC ASSIST DEVICE, IMPELLA
Anesthesia: General

## 2020-10-04 MED ORDER — ARTIFICIAL TEARS OPHTHALMIC OINT
TOPICAL_OINTMENT | OPHTHALMIC | Status: AC
  Filled 2020-10-04: qty 3.5

## 2020-10-04 MED ORDER — OXYCODONE HCL 5 MG PO TABS
5.0000 mg | ORAL_TABLET | ORAL | Status: DC | PRN
  Administered 2020-10-07 – 2020-10-09 (×4): 5 mg via ORAL
  Filled 2020-10-04 (×4): qty 1

## 2020-10-04 MED ORDER — ROCURONIUM BROMIDE 10 MG/ML (PF) SYRINGE
PREFILLED_SYRINGE | INTRAVENOUS | Status: DC | PRN
  Administered 2020-10-04: 30 mg via INTRAVENOUS
  Administered 2020-10-04: 50 mg via INTRAVENOUS

## 2020-10-04 MED ORDER — CEFAZOLIN SODIUM-DEXTROSE 2-3 GM-%(50ML) IV SOLR
INTRAVENOUS | Status: DC | PRN
  Administered 2020-10-04: 2 g via INTRAVENOUS

## 2020-10-04 MED ORDER — ONDANSETRON HCL 4 MG/2ML IJ SOLN
INTRAMUSCULAR | Status: AC
  Filled 2020-10-04: qty 2

## 2020-10-04 MED ORDER — HEPARIN (PORCINE) 25000 UT/250ML-% IV SOLN
1300.0000 [IU]/h | INTRAVENOUS | Status: DC
  Filled 2020-10-04: qty 250

## 2020-10-04 MED ORDER — DEXAMETHASONE SODIUM PHOSPHATE 10 MG/ML IJ SOLN
INTRAMUSCULAR | Status: AC
  Filled 2020-10-04: qty 1

## 2020-10-04 MED ORDER — ORAL CARE MOUTH RINSE
15.0000 mL | Freq: Two times a day (BID) | OROMUCOSAL | Status: DC
  Administered 2020-10-04 – 2020-10-12 (×17): 15 mL via OROMUCOSAL

## 2020-10-04 MED ORDER — MORPHINE SULFATE (PF) 2 MG/ML IV SOLN
2.0000 mg | INTRAVENOUS | Status: DC | PRN
  Administered 2020-10-04: 2 mg via INTRAVENOUS
  Filled 2020-10-04 (×2): qty 1

## 2020-10-04 MED ORDER — LACTATED RINGERS IV SOLN: INTRAVENOUS | Status: DC | PRN

## 2020-10-04 MED ORDER — DEXAMETHASONE SODIUM PHOSPHATE 10 MG/ML IJ SOLN
INTRAMUSCULAR | Status: DC | PRN
  Administered 2020-10-04: 5 mg via INTRAVENOUS

## 2020-10-04 MED ORDER — BISACODYL 10 MG RE SUPP
10.0000 mg | Freq: Once | RECTAL | Status: AC
  Administered 2020-10-07: 10 mg via RECTAL
  Filled 2020-10-04: qty 1

## 2020-10-04 MED ORDER — HEPARIN SODIUM (PORCINE) 1000 UNIT/ML IJ SOLN
INTRAMUSCULAR | Status: AC
  Filled 2020-10-04: qty 1

## 2020-10-04 MED ORDER — FENTANYL CITRATE (PF) 250 MCG/5ML IJ SOLN
INTRAMUSCULAR | Status: AC
  Filled 2020-10-04: qty 5

## 2020-10-04 MED ORDER — HEPARIN SODIUM (PORCINE) 1000 UNIT/ML IJ SOLN
INTRAMUSCULAR | Status: DC | PRN
  Administered 2020-10-04: 10000 [IU] via INTRAVENOUS

## 2020-10-04 MED ORDER — 0.9 % SODIUM CHLORIDE (POUR BTL) OPTIME
TOPICAL | Status: DC | PRN
  Administered 2020-10-04: 1000 mL

## 2020-10-04 MED ORDER — HEMOSTATIC AGENTS (NO CHARGE) OPTIME: TOPICAL | Status: DC | PRN

## 2020-10-04 MED ORDER — ETOMIDATE 2 MG/ML IV SOLN
INTRAVENOUS | Status: DC | PRN
  Administered 2020-10-04: 14 mg via INTRAVENOUS

## 2020-10-04 MED ORDER — LIDOCAINE 2% (20 MG/ML) 5 ML SYRINGE
INTRAMUSCULAR | Status: DC | PRN
  Administered 2020-10-04: 100 mg via INTRAVENOUS

## 2020-10-04 MED ORDER — DOBUTAMINE IN D5W 4-5 MG/ML-% IV SOLN
2.5000 ug/kg/min | INTRAVENOUS | Status: DC
  Filled 2020-10-04: qty 250

## 2020-10-04 MED ORDER — SODIUM CHLORIDE 0.9 % IV SOLN
INTRAVENOUS | Status: DC | PRN
  Administered 2020-10-04: 500 mL

## 2020-10-04 MED ORDER — HYDROMORPHONE HCL 1 MG/ML IJ SOLN
1.0000 mg | INTRAMUSCULAR | Status: DC | PRN
  Administered 2020-10-04 – 2020-10-08 (×9): 1 mg via INTRAVENOUS
  Filled 2020-10-04 (×9): qty 1

## 2020-10-04 MED ORDER — MIDAZOLAM HCL 2 MG/2ML IJ SOLN
INTRAMUSCULAR | Status: DC | PRN
  Administered 2020-10-04 (×2): 1 mg via INTRAVENOUS

## 2020-10-04 MED ORDER — LIDOCAINE 2% (20 MG/ML) 5 ML SYRINGE
INTRAMUSCULAR | Status: AC
  Filled 2020-10-04: qty 5

## 2020-10-04 MED ORDER — HEPARIN SODIUM (PORCINE) 5000 UNIT/ML IJ SOLN
INTRAVENOUS | Status: DC
  Filled 2020-10-04 (×5): qty 10

## 2020-10-04 MED ORDER — NOREPINEPHRINE 4 MG/250ML-% IV SOLN
0.0000 ug/min | INTRAVENOUS | Status: DC
  Filled 2020-10-04: qty 250

## 2020-10-04 MED ORDER — MIDAZOLAM HCL 2 MG/2ML IJ SOLN
INTRAMUSCULAR | Status: AC
  Filled 2020-10-04: qty 2

## 2020-10-04 MED ORDER — FENTANYL CITRATE (PF) 250 MCG/5ML IJ SOLN
INTRAMUSCULAR | Status: DC | PRN
  Administered 2020-10-04 (×3): 50 ug via INTRAVENOUS

## 2020-10-04 MED ORDER — SUCCINYLCHOLINE CHLORIDE 200 MG/10ML IV SOSY
PREFILLED_SYRINGE | INTRAVENOUS | Status: AC
  Filled 2020-10-04: qty 10

## 2020-10-04 MED ORDER — FUROSEMIDE 10 MG/ML IJ SOLN
80.0000 mg | Freq: Once | INTRAMUSCULAR | Status: AC
  Administered 2020-10-04: 80 mg via INTRAVENOUS
  Filled 2020-10-04: qty 8

## 2020-10-04 MED ORDER — HEPARIN SODIUM (PORCINE) 5000 UNIT/ML IJ SOLN
INTRAVENOUS | Status: DC
  Administered 2020-10-04: 350 [IU]/h
  Filled 2020-10-04: qty 10

## 2020-10-04 MED ORDER — SUGAMMADEX SODIUM 200 MG/2ML IV SOLN
INTRAVENOUS | Status: DC | PRN
  Administered 2020-10-04: 200 mg via INTRAVENOUS

## 2020-10-04 MED ORDER — ETOMIDATE 2 MG/ML IV SOLN
INTRAVENOUS | Status: AC
  Filled 2020-10-04: qty 10

## 2020-10-04 MED ORDER — ONDANSETRON HCL 4 MG/2ML IJ SOLN
INTRAMUSCULAR | Status: DC | PRN
  Administered 2020-10-04: 4 mg via INTRAVENOUS

## 2020-10-04 SURGICAL SUPPLY — 48 items
ANCHOR CATH FOLEY SECURE (MISCELLANEOUS) ×3 IMPLANT
APL SRG 7X2 LUM MLBL SLNT (VASCULAR PRODUCTS)
APPLICATOR TIP COSEAL (VASCULAR PRODUCTS) IMPLANT
BLADE CLIPPER SURG (BLADE) ×2 IMPLANT
BLADE SURG 11 STRL SS (BLADE) ×1 IMPLANT
CATH ACCU-VU SIZ PIG 5F 100CM (CATHETERS) ×1 IMPLANT
CATH DIAG EXPO 6F AL1 (CATHETERS) ×2 IMPLANT
CATH INFINITI 6F MPB2 (CATHETERS) ×2 IMPLANT
DRAPE C-ARM 42X72 X-RAY (DRAPES) ×3 IMPLANT
DRAPE CV SPLIT W-CLR ANES SCRN (DRAPES) ×2 IMPLANT
DRAPE PERI GROIN 82X75IN TIB (DRAPES) ×2 IMPLANT
DRSG TEGADERM 4X4.5 CHG (GAUZE/BANDAGES/DRESSINGS) ×1 IMPLANT
GLOVE BIOGEL M STRL SZ7.5 (GLOVE) ×6 IMPLANT
GOWN STRL REUS W/ TWL LRG LVL3 (GOWN DISPOSABLE) ×4 IMPLANT
GOWN STRL REUS W/TWL LRG LVL3 (GOWN DISPOSABLE) ×8
GRAFT HEMASHIELD 10MM (Graft) ×2 IMPLANT
GRAFT VASC STRG 30X10STRL (Graft) IMPLANT
INSERT FOGARTY SM (MISCELLANEOUS) ×3 IMPLANT
KIT BASIN OR (CUSTOM PROCEDURE TRAY) ×2 IMPLANT
LOOP VESSEL MAXI BLUE (MISCELLANEOUS) ×3 IMPLANT
LOOP VESSEL MINI RED (MISCELLANEOUS) ×2 IMPLANT
NS IRRIG 1000ML POUR BTL (IV SOLUTION) ×8 IMPLANT
PACK CHEST (CUSTOM PROCEDURE TRAY) ×2 IMPLANT
PAD ARMBOARD 7.5X6 YLW CONV (MISCELLANEOUS) ×4 IMPLANT
PAD ELECT DEFIB RADIOL ZOLL (MISCELLANEOUS) ×2 IMPLANT
PUMP SET IMPELLA 5.5 US (CATHETERS) ×2 IMPLANT
SEALANT SURG COSEAL 8ML (VASCULAR PRODUCTS) ×1 IMPLANT
SUT ETHILON 3 0 PS 1 (SUTURE) ×4 IMPLANT
SUT PROLENE 5 0 C 1 36 (SUTURE) ×1 IMPLANT
SUT PROLENE 5 0 C1 (SUTURE) ×6 IMPLANT
SUT SILK  1 MH (SUTURE) ×8
SUT SILK 1 MH (SUTURE) ×4 IMPLANT
SUT SILK 1 TIES 10X30 (SUTURE) ×2 IMPLANT
SUT SILK 2 0 SH CR/8 (SUTURE) ×1 IMPLANT
SUT SILK 3 0 (SUTURE) ×2
SUT SILK 3 0 TIES 17X18 (SUTURE) ×2
SUT SILK 3-0 18XBRD TIE 12 (SUTURE) IMPLANT
SUT SILK 3-0 18XBRD TIE BLK (SUTURE) ×1 IMPLANT
SUT VIC AB 2-0 CT1 18 (SUTURE) ×4 IMPLANT
SUT VIC AB 2-0 CT1 27 (SUTURE) ×2
SUT VIC AB 2-0 CT1 TAPERPNT 27 (SUTURE) IMPLANT
SUT VIC AB 3-0 X1 27 (SUTURE) ×2 IMPLANT
TOWEL GREEN STERILE (TOWEL DISPOSABLE) ×2 IMPLANT
TOWEL GREEN STERILE FF (TOWEL DISPOSABLE) ×2 IMPLANT
TRAY FOLEY SLVR 16FR TEMP STAT (SET/KITS/TRAYS/PACK) ×2 IMPLANT
TUBE SUCTION CARDIAC 10FR (CANNULA) ×1 IMPLANT
WATER STERILE IRR 1000ML POUR (IV SOLUTION) ×3 IMPLANT
WIRE EMERALD 3MM-J .035X260CM (WIRE) ×1 IMPLANT

## 2020-10-04 NOTE — Progress Notes (Signed)
This chaplain responded to PMT consult for spiritual care.  The Pt. is sitting up in his bedside recliner sharing conversation with a visitor.  After a brief introduction of the role of a hospital chaplain, the Pt. easily shares his faith and relationship with other faith leaders.  The chaplain agrees with the Pt. to F/U as needed after today's procedure.

## 2020-10-04 NOTE — Anesthesia Procedure Notes (Deleted)
Procedure Name: Intubation Date/Time: 10/04/2020 2:41 PM Performed by: Suzette Battiest, MD Pre-anesthesia Checklist: Patient identified, Emergency Drugs available, Suction available and Patient being monitored Patient Re-evaluated:Patient Re-evaluated prior to induction Oxygen Delivery Method: Circle System Utilized Preoxygenation: Pre-oxygenation with 100% oxygen Induction Type: IV induction Ventilation: Mask ventilation without difficulty Laryngoscope Size: Mac and 4 Grade View: Grade III Tube type: Oral Tube size: 8.0 mm Number of attempts: 1 Airway Equipment and Method: Stylet Placement Confirmation: ETT inserted through vocal cords under direct vision,  positive ETCO2 and breath sounds checked- equal and bilateral Secured at: 22 cm Tube secured with: Tape Dental Injury: Teeth and Oropharynx as per pre-operative assessment  Comments: SRNA attempt x1 with Mac 4, grade III view.  Dr. Ola Spurr placed ETT with MAC 4, grade III view

## 2020-10-04 NOTE — Progress Notes (Signed)
[facility] for heparin Indication: atrial fibrillation  Allergies  Allergen Reactions  . Meclizine Hcl Anaphylaxis and Swelling  . Ivabradine Nausea Only    Patient Measurements: Height: 5' 10.5" (179.1 cm) Weight: 87.6 kg (193 lb 3.2 oz) IBW/kg (Calculated) : 74.15 Heparin Dosing Weight: 88.5 kg   Vital Signs: Temp: 98.2 F (36.8 C) (03/01 0400) Temp Source: Oral (03/01 0400) BP: 129/75 (03/01 0630) Pulse Rate: 97 (03/01 0630)  Labs: Recent Labs    10/02/20 0637 10/03/20 0510 10/03/20 2112 10/03/20 2200 10/04/20 0444  HGB 11.6* 11.7*  --   --  11.4*  HCT 36.7* 38.5*  --   --  37.0*  PLT 190 177  --   --  180  APTT  --   --   --  60* 65*  HEPARINUNFRC  --   --  >2.20*  --   --   CREATININE  --  2.46*  --  2.48* 2.40*    Estimated Creatinine Clearance: 34.8 mL/min (A) (by C-G formula based on SCr of 2.4 mg/dL (H)).   Assessment: 21 yom presenting with volume overload with low output HF - plan now for impella placement while undergoing LVAD w/u. On apixaban PTA for hx Afib - LD 2/27@2144 .  APTT slightly subtherapeutic at 65 sec after increasing drip rate to 1300 units/hr. HL remains falsely elevated given recent apixaban. Will dose based off aPTT until levels correlate.   Hgb stable 11s, pltc 180. No overt bleeding or infusion issues. Patient going for impella placement today, so will keep current rate given anticipation of procedure.  Goal of Therapy:  APTT 66-102 sec Heparin level 0.3-0.7 units/ml Monitor platelets by anticoagulation protocol: Yes   Plan:  Continue Heparin infusion at 1300 units/hr Daily aPTT, HL, CBC, s/sx bleeding F/u post-Impella placement  [name], PharmD, BCPS PGY2 Cardiology Pharmacy Resident Phone: [phone] 10/04/2020  6:53 AM  Please check [email or web address] for unit-specific pharmacy phone numbers.

## 2020-10-04 NOTE — Progress Notes (Signed)
Patient awake, lying in bed. Still c/o nausea with vomiting this am. He is also c/o being constipated. Darrick Grinder, NP updated; suppository ordered.  Wraps removed from patients' lower legs and examined by Darrick Grinder, NP.     Pt has no questions about VAD at this time. He does verbalized plan for Impella implant at 1:00 pm today. He understands if kidney function does not improve with Impella support, HM III LVAD will not be helpful for him and will not be an option.  VAD patient taken to room and introduced to Marcus Johnson. He will spend time answering questions and showing patient his equipment.  Zada Girt RN, VAD Coordinator 24/7 VAD Pager: [phone]

## 2020-10-04 NOTE — Op Note (Signed)
CARDIOVASCULAR SURGERY OPERATIVE NOTE  10/04/2020 Marcus Johnson. [id number]  Surgeon:  Gaye Pollack, MD  First Assistant: RNFA   Preoperative Diagnosis:  Acute on chronic systolic heart failure with EF 20%   Postoperative Diagnosis:  Same   Procedure:  1. Insertion of left axillary artery Impella 5.5 left ventricular assist device.   Anesthesia:  General Endotracheal   Clinical History/Surgical Indication:  The patient is a 60 year old male with acute on chronic systolic congestive heart failure due to ischemic cardiomyopathy with ejection fraction of 20 to 25% who was admitted with repeat heart failure symptoms.  He has been on inotropic therapy with milrinone and now dobutamine process of being worked up for a durable left ventricular assist device.  His creatinine has been rising with a marginal Co-ox and was felt by the multidisciplinary heart failure team that the best option would be to insert an Impella 5.5 to try to wean him off further and allow his creatinine to come back down to his baseline before proceeding with insertion of a HeartMate 3. I discussed the operative procedure of right axillary Impella insertion including alternatives, benefits, and risks including but not limited to bleeding, blood transfusion, vascular injury, cardiac perforation, stroke, and death.  He understands and agrees to proceed.  Preparation:  The patient was seen in the preoperative holding area and the correct patient, correct operation were confirmed with the patient after reviewing the medical record and catheterization. The consent was signed by me. Preoperative antibiotics were given. A pulmonary arterial line and radial arterial line were placed by the anesthesia team. The patient was taken back to the operating room and positioned supine on the operating room table. After being placed under general endotracheal anesthesia by the anesthesia team a foley catheter was placed. The  neck, chest, and abdomen were prepped with betadine soap and solution and draped in the usual sterile manner. A surgical time-out was taken and the correct patient and operative procedure were confirmed with the nursing and anesthesia staff.   TEE:   Complete TEE assessment was performed by Dr. Suzette Battiest.  This showed severe left ventricular systolic dysfunction as expected with ejection fraction of 20%.  Right ventricular systolic function appeared mildly decreased.  There was severe pulmonary hypertension.  There is mild mitral regurgitation and moderate tricuspid regurgitation noted which appeared to be coming around the defibrillator lead.   Insertion of Impella 5.5  A transverse incision was made below the right clavicle. The pectoralis major muscle was split along its fibers and the pectoralis minor muscle was retracted laterally. The brachial plexus was identified and gently retracted laterally to expose the axillary artery. The artery was controlled proximally and distally with vessel loops. The patient was given 10,000 units of heparin and ACT maintained greater than 250. The axillary artery was clamped proximally and distally with peripheral Debakey clamps. It was opened longitudinally. A 10 mm x 20 cm Hemashield Platinum graft was anastomosed in an end to side manner using continuous 5-0 prolene suture. CoSeal was applied for hemostasis and the clamp removed. The anastomosis appeared hemostatic.   The graft was clamped adjacent to the anastomosis.  Then the graft was trimmed to 10 cm in length.  The peel-away sheath was inserted into the end of the graft and the supplied hemostatic connectors clamped.  The clamp was removed from the graft.  A 0.35 J-wire was inserted through the sheath and advanced under fluoroscopic guidance into the ascending aorta.  A pigtail  catheter was inserted over this.  The pigtail and J-wire were passed across the aortic valve and down to the left  ventricular apex.  The 0.35 wire was exchanged for a 0.18 wire which was coiled at the left ventricular apex.  The pigtail catheter was removed.  The Impella 5.5 was inserted over the wire and advanced into the sheath until just the outflow was exposed.  The clamp was briefly removed from the graft to flush the pump.  The pump was inserted further into the sheath and the clamp removed from the graft.  The Impella was advanced over the guidewire into the left ventricular apex under fluoroscopic guidance.  The guidewire was removed.  The Impella was started at P2 and slowly advanced up to P7.  The graft was clamped with a soft fogarty clamp and the peel-away sheath removed.  The graft was trimmed to an appropriate length and the suture ring inserted and fixed to the graft with two heavy silk ties. The suture ring was fixed to the skin with heavy silk ties. Final adjustment of the Impella position was done using TEE guidance. The wound was hemostatic. The muscle was approximated with continuous 2-0 Vicryl suture. Subcutaneous tissue was approximated with continuous 3-0 Vicryl suture and the skin with 3-0 Vicryl subcuticular closure. Sponge, needle and instrument counts were correct. A dry sterile dressing was applied and the Impella cord fixed to the skin with multiple anchors. The patient was awakened, extubated and transported to the ICU in stable condition.

## 2020-10-04 NOTE — Anesthesia Preprocedure Evaluation (Signed)
Anesthesia Evaluation  Patient identified by MRN, date of birth, ID band Patient awake    Reviewed: Allergy & Precautions, NPO status , Patient's Chart, lab work & pertinent test results  Airway Mallampati: II  TM Distance: >3 FB Neck ROM: Full    Dental  (+) Dental Advisory Given   Pulmonary shortness of breath,    breath sounds clear to auscultation       Cardiovascular hypertension, Pt. on medications and Pt. on home beta blockers + CAD, + Past MI, + CABG and +CHF  + dysrhythmias + Cardiac Defibrillator  Rhythm:Regular Rate:Normal     Neuro/Psych  Neuromuscular disease    GI/Hepatic negative GI ROS, Neg liver ROS,   Endo/Other  diabetes, Type 2  Renal/GU CRFRenal disease     Musculoskeletal   Abdominal   Peds  Hematology  (+) anemia ,   Anesthesia Other Findings   Reproductive/Obstetrics                             Lab Results  Component Value Date   WBC 6.7 10/04/2020   HGB 11.4 (L) 10/04/2020   HCT 37.0 (L) 10/04/2020   MCV 81.7 10/04/2020   PLT 180 10/04/2020   Lab Results  Component Value Date   CREATININE 2.40 (H) 10/04/2020   BUN 50 (H) 10/04/2020   NA 131 (L) 10/04/2020   K 3.7 10/04/2020   CL 88 (L) 10/04/2020   CO2 27 10/04/2020    Anesthesia Physical Anesthesia Plan  ASA: IV  Anesthesia Plan: General   Post-op Pain Management:    Induction: Intravenous  PONV Risk Score and Plan: 2 and Ondansetron, Dexamethasone and Treatment may vary due to age or medical condition  Airway Management Planned: Oral ETT  Additional Equipment: Arterial line, CVP, PA Cath, TEE and Ultrasound Guidance Line Placement  Intra-op Plan:   Post-operative Plan: Possible Post-op intubation/ventilation  Informed Consent: I have reviewed the patients History and Physical, chart, labs and discussed the procedure including the risks, benefits and alternatives for the proposed  anesthesia with the patient or authorized representative who has indicated his/her understanding and acceptance.     Dental advisory given  Plan Discussed with: CRNA  Anesthesia Plan Comments:         Anesthesia Quick Evaluation

## 2020-10-04 NOTE — Progress Notes (Signed)
[facility] for heparin Indication: atrial fibrillation  Allergies  Allergen Reactions  . Meclizine Hcl Anaphylaxis and Swelling  . Ivabradine Nausea Only    Patient Measurements: Height: 5' 10.5" (179.1 cm) Weight: 87.6 kg (193 lb 3.2 oz) IBW/kg (Calculated) : 74.15 Heparin Dosing Weight: 88.5 kg   Vital Signs: Temp: 98.6 F (37 C) (03/01 1111) Temp Source: Oral (03/01 1111) BP: 118/94 (03/01 0800) Pulse Rate: 97 (03/01 0800)  Labs: Recent Labs    10/02/20 0637 10/03/20 0510 10/03/20 2112 10/03/20 2200 10/04/20 0444 10/04/20 1632  HGB 11.6* 11.7*  --   --  11.4* 12.9*  HCT 36.7* 38.5*  --   --  37.0* 38.0*  PLT 190 177  --   --  180  --   APTT  --   --   --  60* 65*  --   HEPARINUNFRC  --   --  >2.20*  --  2.08*  --   CREATININE  --  2.46*  --  2.48* 2.40* 2.30*    Estimated Creatinine Clearance: 36.3 mL/min (A) (by C-G formula based on SCr of 2.3 mg/dL (H)).   Assessment: 22 yom presenting with volume overload with low output HF - plan now for impella placement while undergoing LVAD w/u. On apixaban PTA for hx Afib - LD 2/27@2144 .  APTT slightly subtherapeutic at 65 sec after increasing drip rate to 1300 units/hr. HL remains falsely elevated given recent apixaban. Will dose based off aPTT until levels correlate.   Hgb stable 11s, pltc 180. No overt bleeding or infusion issues. Patient going for impella placement today, so will keep current rate given anticipation of procedure.  PM update - patient now s/p Impella 5.5 placement. Plan to continue heparin impella purge 50 units/ml tonight with consideration for systemic heparin tomorrow at 0800 per discussion with Dr. Cyndia Bent.  Goal of Therapy:  APTT 66-102 sec Heparin level 0.3-0.7 units/ml Monitor platelets by anticoagulation protocol: Yes   Plan:  Continue Impella heparin purge 50 units/ml tonight Pharmacy to readdress systemic heparin start tomorrow AM at 0800 per  discussion with Dr. Cyndia Bent Monitor daily heparin level and CBC, s/sx bleeding   Arturo Morton, PharmD, BCPS Please check AMION for all [facility] contact numbers Clinical Pharmacist 10/04/2020 7:01 PM

## 2020-10-04 NOTE — Anesthesia Procedure Notes (Signed)
Central Venous Catheter Insertion Performed by: Suzette Battiest, MD, anesthesiologist Start/End3/08/2020 1:30 PM, 10/04/2020 1:45 PM Patient location: Pre-op. Preanesthetic checklist: patient identified, IV checked, site marked, risks and benefits discussed, surgical consent, monitors and equipment checked, pre-op evaluation, timeout performed and anesthesia consent Position: Trendelenburg Lidocaine 1% used for infiltration and patient sedated Hand hygiene performed , maximum sterile barriers used  and Seldinger technique used Catheter size: 8.5 Fr Total catheter length 10. Central line and PA cath was placed.Sheath introducer Swan type:thermodilution PA Cath depth:45 Procedure performed using ultrasound guided technique. Ultrasound Notes:anatomy identified, needle tip was noted to be adjacent to the nerve/plexus identified, no ultrasound evidence of intravascular and/or intraneural injection and image(s) printed for medical record Attempts: 1 Following insertion, line sutured, dressing applied and Biopatch. Post procedure assessment: blood return through all ports, free fluid flow and no air  Patient tolerated the procedure well with no immediate complications.

## 2020-10-04 NOTE — Anesthesia Procedure Notes (Signed)
Central Venous Catheter Insertion Performed by: Suzette Battiest, MD, anesthesiologist Start/End3/08/2020 1:30 PM, 10/04/2020 1:45 PM Patient location: Pre-op. Preanesthetic checklist: patient identified, IV checked, site marked, risks and benefits discussed, surgical consent, monitors and equipment checked, pre-op evaluation, timeout performed and anesthesia consent Hand hygiene performed  and maximum sterile barriers used  PA cath was placed.Swan type:thermodilution Procedure performed without using ultrasound guided technique. Attempts: 1 Patient tolerated the procedure well with no immediate complications.

## 2020-10-04 NOTE — Progress Notes (Signed)
  Echocardiogram Echocardiogram Transesophageal has been performed.  Marcus Johnson 10/04/2020, 3:40 PM

## 2020-10-04 NOTE — Anesthesia Procedure Notes (Addendum)
Procedure Name: Intubation Date/Time: 10/04/2020 2:45 PM Performed by: Suzette Battiest, MD Pre-anesthesia Checklist: Patient identified, Emergency Drugs available, Suction available and Patient being monitored Patient Re-evaluated:Patient Re-evaluated prior to induction Oxygen Delivery Method: Circle system utilized Preoxygenation: Pre-oxygenation with 100% oxygen Induction Type: IV induction Ventilation: Mask ventilation without difficulty Laryngoscope Size: Mac and 4 Grade View: Grade II Tube type: Oral Tube size: 7.5 mm Number of attempts: 3 Airway Equipment and Method: Stylet and Oral airway Placement Confirmation: ETT inserted through vocal cords under direct vision,  positive ETCO2 and breath sounds checked- equal and bilateral Secured at: 23 cm Tube secured with: Tape Dental Injury: Teeth and Oropharynx as per pre-operative assessment and Injury to lip  Difficulty Due To: Difficult Airway- due to large tongue and Difficult Airway- due to reduced neck mobility Comments: DL x 1 attempt by SRNA with poor view and unable to pass tube. DL x1 by myself with grade 3 view, limited neck extension and copious secretions. Unable to pass tube. Returned to mask ventilation. DL x2 by myself with Grade 2 view. ATOI. +BBS, +ETCO2. Tube secured.

## 2020-10-04 NOTE — Progress Notes (Addendum)
LVAD Initial Psychosocial Screening  Date/Time Initiated:  10-03-20 Referral Source:  Emerson Monte, VAD Coordinator Referral Reason:   LVAD Implantation Source of Information: Pt.and chart review   Demographics Name:  Marcus Johnson Address:  [address] [place] [address] Cell: [phone] Marital Status:  Divorced  Faith:  International Primary Language:  English DOB:  Oct 21, 1960  Medical & Follow-up Adherence to Medical regimen/INR checks: compliant  Medication adherence: compliant  Physician/Clinic Appointment Attendance: compliant Comments:    Advance Directives: Do you have a Living Will or Medical POA? No  Would you like to complete a Living Will and Medical POA prior to surgery?  No Patient states his Son will make all decisions and is aware of his goals of care. Do you have Goals of Care? Yes  Have you had a consult with the Palliative Care Team at [facility]? No  Psychological Health Appearance:  In hospital gown Mental Status:  Alert, oriented Eye Contact:  Good Thought Content:  Coherent Speech:  Logical/coherent Mood:  Appropriate  Affect:  Appropriate to circumstance Insight:  Good Judgement: Unimpaired Interaction Style:  Engaged  Family/Social Information Who lives in your home? Name:   Relationship:   Marcus Johnson Marcus Johnson  Other family members/support persons in your life? Name:   Relationship:   [place] Needs Who is the primary caregiver? Marcus Johnson status: good  Do you drive?  yes Do you work?  no Physical Limitations:  none Do you have other care giving responsibilities?  none  Contact number:[phone]  Who is the secondary caregiver? [place] status:  good Do you drive?  yes Do you work?  yes Physical Limitations: none  Do you have other care giving responsibilities?  none Contact [place] Environment/Personal Care Do you have reliable phone  service? Yes  If so, what is the number?  [phone] Do you own or rent your home? rent Number of steps into the home? 26 steps in total How many levels in the home? 3rd floor apartment Assistive devices in the home? none Electrical needs for LVAD (3 prong outlets)? yes Second hand smoke exposure in the home? no Travel distance from [facility]? 15 minutes  Community Are you active with community agencies/resources/homecare? No Agency Name: n/a Are you active in a church, synagogue, mosque or other faith based community? Yes Faith based institutions name: [employer] What other sources do you have for spiritual support? Many church members Are you active in any clubs or social organizations? none What do you do for fun?  Hobbies?  Interests? Play cards with his brother  Education/Work Information What is the last grade of school you completed? 12th grade and then some college Preferred method of learning?  Written and Hands on Do you have any problems with reading or writing?  No Are you currently employed?  No  When were you last employed? On disability since 11/21  Name of employer? 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 10 years If you are not working, do you plan to return to work after VAD surgery? No Are you interested in job training or learning new skills?  No Did you serve in the [place]? No  If so, what branch? Other  Financial Information What is your source of income? SSD $2,000 Do you have difficulty meeting your monthly expenses? No Can you  budget for the monthly cost for dressing supplies post procedure? Yes   Primary Health insurance:  Medicare Secondary Insurance:  Prescription plan: Medicare D What are your prescription co-pays? varies Do you use mail order for your prescriptions?  No Have you ever had to refuse medication due to cost?  No Have you applied for Medicaid?  no Have you  applied for Social Security Disability (SSI)  current  Medical Information Briefly describe why you are here for evaluation: Patient reports he had open heart surgery in 2003 Do you have a PCP or other medical provider? Marcus Morale, MD Are you able to complete your ADL's? yes Do you have a history of trauma, physical, emotional, or sexual abuse? no Do you have any family history of heart problems? no Do you smoke now or past usage? never    Do you drink alcohol now or past usage? never     Are you currently using illegal drugs or misuse of medication or past usage? never  Have you ever been treated for substance abuse? No       Mental Health History How have you been feeling in the past year? Pt reports he had COVID a month ago and has not been feeling good since that time. Needs eye surgery Have you ever had any problems with depression, anxiety or other mental health issues? none Do you see a counselor, psychiatrist or therapist?  no If you are currently experiencing problems are you interested in talking with a professional? No Have you or are you taking medications for anxiety/depression or any mental health concerns?  No  Current Medications: n/a What are your coping strategies under stressful situations? pray Are there any other stressors in your life? no Have you had any past or current thoughts of suicide? no How many hours do you sleep at night? varies How is your appetite? Faded away Would you be interested in attending the LVAD support group? yes  PHQ2 Depression Scale: 0  Legal Do you currently have any legal issues/problems?  no Have you had any legal issues/problems in the past?  no Do you have a Durable POA?  no     Plan for VAD Implementation Do you know and understand what happens during the VAD surgery? Patient Verbalizes Understanding  of surgery and able to describe details What do you know about the risks and side effect associated with VAD surgery?  Patient Verbalizes Understanding  of risks (infection, stroke and death) Explain what will happen right after surgery: Patient Verbalizes Understanding  of OR to ICU and will be intubated What is your plan for transportation for the first 8 weeks post-surgery? (Patients are not recommended to drive post-surgery for 8 weeks)  Driver:   [place]  Do you have airbags in your vehicle?  There is a risk of discharging the device if the airbag were to deploy. What do you know about your diet post-surgery? Patient Verbalizes Understanding  of Heart healthy How do you plan to monitor your medications, current and future?  Pill box  How do you plan to complete ADL's post-surgery.Will it be difficult to ask for help from your caregivers?  Family will assist  Please explain what you hope will be improved about your life as a result of receiving the LVAD? "Improve my life and not take life away" Please tell me your biggest concern or fear about living with the LVAD?  "I am scared" How do you cope with your concerns and fears?  pray Please explain your understanding of how their body will change? "It might at first but I will get used to it" Are you worried about these changes? no Do you see any barriers to your surgery or follow-up? Patient states "No we got to get it in place"  Understanding of LVAD Patient states understanding of the following: Surgical procedures and risks, Electrical need for LVAD (3 prong outlets), Safety precautions with LVAD (water, etc.), LVAD daily self-care (dressing changes, computer check, extra supplies), Outpatient follow up (LVAD clinic appts, monitoring blood thinners) and Need for Emergency Planning  Discussed and Reviewed with Patient and Caregiver  Patient's current level of motivation to prepare for LVAD: Motivated Patient's present Level of Consent for LVAD: Ready   Education provided to patient/family/caregiver:   Caregiver role and responsibiltiy, Financial  planning for LVAD, Role of Clinical Social Worker and Signs of Depression and Anxiety   Clinical Interventions Needed:    CSW will monitor signs and symptoms of depression and assist with adjustment to life with an LVAD.  CSW will refer patient for Advanced Directives if he changes his mind. Currently, patient insisting that it's not needed and Son will make decisions. CSW encouraged attendance with the LVAD Support Group to assist further with adjustment and post implant peer support.    Clinical Impressions/Recommendations:   Patient is a 60yo male who is divorced many years ago. Patient lives with his brother and son in a 3rd floor apartment. Patient reports he worked in [employer] for the last 10 years and recently started receiving SSD benefits. He states he is compliant with medical follow up and medications. He is not interested in completing a living will as he states his son will make all decisions. He states he has a good support system with his church members and pray helps him through stressful times. He enjoys playing cards with his brother for fun. Patient denies any tobacco, alcohol or illegal substance use. Patient denies any mental health concerns and scored a 0 on the PHQ-2. He states that he can get emotional during stressful times but prays as his coping mechanism.  He hopes to improve his life but getting the LVAD. He stated that he is scared but hopeful for improved health. He also stated that he might at first have some body image but feels he will get use to it. He is motivated and states "we got to get it in place" when asked if he is ready to proceed. Patient appears to be a good candidate for LVAD implant. CSW would recommend patient for implant.  Melba Coon, [place]
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
## 2020-10-04 NOTE — Transfer of Care (Signed)
Immediate Anesthesia Transfer of Care Note  Patient: Marcus Johnson.  Procedure(s) Performed: PLACEMENT OF IMPELLA 5.5 LEFT VENTRICULAR ASSIST DEVICE (N/A ) TRANSESOPHAGEAL ECHOCARDIOGRAM (TEE) (N/A )  Patient Location: SICU  Anesthesia Type:General  Level of Consciousness: awake, alert  and oriented  Airway & Oxygen Therapy: Patient Spontanous Breathing and Patient connected to face mask oxygen  Post-op Assessment: Report given to RN and Post -op Vital signs reviewed and stable  Post vital signs: Reviewed and stable  Last Vitals:  Vitals Value Taken Time  BP    Temp 36.7 C 10/04/20 1824  Pulse 88 10/04/20 1824  Resp 13 10/04/20 1824  SpO2 100 % 10/04/20 1824  Vitals shown include unvalidated device data.  Last Pain:  Vitals:   10/04/20 1111  TempSrc: Oral  PainSc:          Complications: No complications documented.

## 2020-10-04 NOTE — Anesthesia Procedure Notes (Signed)
Arterial Line Insertion Start/End3/08/2020 1:30 PM Performed by: Alain Marion, CRNA, CRNA  Preanesthetic checklist: patient identified, IV checked, site marked, risks and benefits discussed, surgical consent, monitors and equipment checked, pre-op evaluation, timeout performed and anesthesia consent Left, radial was placed Catheter size: 20 G Hand hygiene performed  and maximum sterile barriers used   Attempts: 1 Procedure performed without using ultrasound guided technique. Following insertion, dressing applied and Biopatch. Post procedure assessment: normal and unchanged  Patient tolerated the procedure well with no immediate complications.

## 2020-10-04 NOTE — Progress Notes (Addendum)
Patient ID: Marcus Coston., male   DOB: [birth year], 60 y.o.   MRN: [id number]     Advanced Heart Failure Rounding Note  PCP-Cardiologist: No primary care provider on file.   Subjective:    2/27 Milrinone switched to dobutamine 5 mcg.  2/28 Moved to ICU. Dobutamine increased to 7.5 mcg and Norepi 3 mcg added. Diuretics held.   Todays CO-OX 64%.   Creatinine 2.4>2  Complaining of nausea.    Echo: EF 20-25%, normal RV, PASP 61, dilated IVC.    Objective:   Weight Range: 87.6 kg Body mass index is 27.33 kg/m.   Vital Signs:   Temp:  [98.2 F (36.8 C)-98.9 F (37.2 C)] 98.2 F (36.8 C) (03/01 0400) Pulse Rate:  [89-110] 99 (03/01 0700) Resp:  [9-27] 18 (03/01 0700) BP: (99-129)/(55-99) 128/82 (03/01 0700) SpO2:  [92 %-100 %] 96 % (03/01 0700) Weight:  [87.6 kg] 87.6 kg (03/01 0530) Last BM Date: 10/03/20  Weight change: Filed Weights   10/02/20 2300 10/03/20 0555 10/04/20 0530  Weight: 88.5 kg 88.5 kg 87.6 kg    Intake/Output:   Intake/Output Summary (Last 24 hours) at 10/04/2020 0743 Last data filed at 10/04/2020 0700 Gross per 24 hour  Intake 843.25 ml  Output 1000 ml  Net -156.75 ml      Physical Exam   CVP 13.  General:  . No resp difficulty HEENT: normal Neck: supple. JVP 12-13  no JVD. Carotids 2+ bilat; no bruits. No lymphadenopathy or thryomegaly appreciated. Cor: PMI nondisplaced. Tachy Irregular rate & rhythm. No rubs, or murmurs. +S3  Lungs: clear Abdomen: soft, nontender, nondistended. No hepatosplenomegaly. No bruits or masses. Good bowel sounds. Extremities: no cyanosis, clubbing, rash, edema. RUE PICC  Neuro: alert & orientedx3, cranial nerves grossly intact. moves all 4 extremities w/o difficulty. Affect pleasant Skin: R foot small linear partial thickness skin loss.    Telemetry  ST with PVCs/PACs   Labs    CBC Recent Labs    10/03/20 0510 10/04/20 0444  WBC 6.8 6.7  HGB 11.7* 11.4*  HCT 38.5* 37.0*  MCV 80.5 81.7   PLT 177 149   Basic Metabolic Panel Recent Labs    10/03/20 0510 10/03/20 2200 10/04/20 0444  NA 136 133* 131*  K 3.8 3.6 3.7  CL 90* 91* 88*  CO2 31 29 27   GLUCOSE 201* 296* 366*  BUN 56* 54* 50*  CREATININE 2.46* 2.48* 2.40*  CALCIUM 9.3 9.2 9.2  MG 2.3 2.5*  --    Liver Function Tests No results for input(s): AST, ALT, ALKPHOS, BILITOT, PROT, ALBUMIN in the last 72 hours. No results for input(s): LIPASE, AMYLASE in the last 72 hours. Cardiac Enzymes No results for input(s): CKTOTAL, CKMB, CKMBINDEX, TROPONINI in the last 72 hours.  BNP: BNP (last 3 results) No results for input(s): BNP in the last 8760 hours.  ProBNP (last 3 results) No results for input(s): PROBNP in the last 8760 hours.   D-Dimer No results for input(s): DDIMER in the last 72 hours. Hemoglobin A1C No results for input(s): HGBA1C in the last 72 hours. Fasting Lipid Panel No results for input(s): CHOL, HDL, LDLCALC, TRIG, CHOLHDL, LDLDIRECT in the last 72 hours. Thyroid Function Tests No results for input(s): TSH, T4TOTAL, T3FREE, THYROIDAB in the last 72 hours.  Invalid input(s): FREET3  Other results:   Imaging    VAS US DOPPLER PRE VAD  Result Date: 10/03/2020 PERIOPERATIVE VASCULAR EVALUATION Risk Factors:     Hypertension, hyperlipidemia, Diabetes,  prior MI. Other Factors:    Preop VAD. Limitations:      bandages, shaking, atrial fibrillation Comparison Study: Prior normal carotid 08/13/2018, prior ABI 08/04/2018 Performing Technologist: Sharion Dove RVS Supporting Technologist: Leavy Cella RDCS  Examination Guidelines: A complete evaluation includes B-mode imaging, spectral Doppler, color Doppler, and power Doppler as needed of all accessible portions of each vessel. Bilateral testing is considered an integral part of a complete examination. Limited examinations for reoccurring indications may be performed as noted.  Right Carotid Findings:  +----------+--------+--------+--------+------------+------------------+           PSV cm/sEDV cm/sStenosisDescribe    Comments           +----------+--------+--------+--------+------------+------------------+ CCA Prox  103     20                          intimal thickening +----------+--------+--------+--------+------------+------------------+ CCA Distal70      14                          intimal thickening +----------+--------+--------+--------+------------+------------------+ ICA Prox  63      14              heterogenous                   +----------+--------+--------+--------+------------+------------------+ ICA Distal68      16                                             +----------+--------+--------+--------+------------+------------------+ ECA       119     14                                             +----------+--------+--------+--------+------------+------------------+ Portions of this table do not appear on this page. +----------+--------+-------+--------+------------+           PSV cm/sEDV cmsDescribeArm Pressure +----------+--------+-------+--------+------------+ Subclavian149                                 +----------+--------+-------+--------+------------+ +---------+--------+--+--------+-+ VertebralPSV cm/s35EDV cm/s5 +---------+--------+--+--------+-+ Left Carotid Findings: +----------+--------+--------+--------+------------+------------------+           PSV cm/sEDV cm/sStenosisDescribe    Comments           +----------+--------+--------+--------+------------+------------------+ CCA Prox  128     23                          intimal thickening +----------+--------+--------+--------+------------+------------------+ CCA Distal71      16                          intimal thickening +----------+--------+--------+--------+------------+------------------+ ICA Prox  72      22              heterogenous                    +----------+--------+--------+--------+------------+------------------+ ICA Distal61      24                                             +----------+--------+--------+--------+------------+------------------+  ECA       100     20                                             +----------+--------+--------+--------+------------+------------------+ +----------+--------+--------+--------+------------+ SubclavianPSV cm/sEDV cm/sDescribeArm Pressure +----------+--------+--------+--------+------------+           151                     113          +----------+--------+--------+--------+------------+  ABI Findings: +---------+------------------+-----+---------+----------+ Right    Rt Pressure (mmHg)IndexWaveform Comment    +---------+------------------+-----+---------+----------+ Brachial                        triphasicRestricted +---------+------------------+-----+---------+----------+ DP       94                0.83 biphasic            +---------+------------------+-----+---------+----------+ Juanetta Snow                                         +---------+------------------+-----+---------+----------+ +---------+------------------+-----+---------+-------+ Left     Lt Pressure (mmHg)IndexWaveform Comment +---------+------------------+-----+---------+-------+ Brachial 113                    triphasic        +---------+------------------+-----+---------+-------+ DP       75                0.66 biphasic         +---------+------------------+-----+---------+-------+ Silvano Bilis                                      +---------+------------------+-----+---------+-------+ +-------+---------------+----------------+ ABI/TBIToday's ABI/TBIPrevious ABI/TBI +-------+---------------+----------------+ Right  0.83           1.0              +-------+---------------+----------------+ Left   0.66           0.92              +-------+---------------+----------------+ Right and Left ABIs appear decreased. Left TBIs appear decreased. Summary: Right Carotid: The extracranial vessels were near-normal with only minimal wall                thickening or plaque. Left Carotid: The extracranial vessels were near-normal with only minimal wall               thickening or plaque. Vertebrals:  Bilateral vertebral arteries demonstrate antegrade flow. Subclavians: Normal flow hemodynamics were seen in bilateral subclavian              arteries.  *See table(s) above for measurements and observations. Right ABI: Resting right ankle-brachial index indicates mild right lower extremity arterial disease. The right toe-brachial index is abnormal. Left ABI: Resting left ankle-brachial index indicates moderate left lower extremity arterial disease. The left toe-brachial index is abnormal.  Electronically signed by Servando Snare MD on 10/03/2020 at 5:52:00 PM.    Final    VAS [place] LOWER EXTREMITY VENOUS (DVT)  Result Date: 10/03/2020  Lower Venous DVT Study Indications: Pre-op, and LVAD Preop.  Limitations: Bandages. Comparison Study: Prior normal study done 01/10/2018 Performing  Technologist: Sharion Dove RVS Supporting Technologist: Leavy Cella RDCS  Examination Guidelines: A complete evaluation includes B-mode imaging, spectral Doppler, color Doppler, and power Doppler as needed of all accessible portions of each vessel. Bilateral testing is considered an integral part of a complete examination. Limited examinations for reoccurring indications may be performed as noted. The reflux portion of the exam is performed with the patient in reverse Trendelenburg.  +---------+---------------+---------+-----------+----------+-------------------+ RIGHT    CompressibilityPhasicitySpontaneityPropertiesThrombus Aging      +---------+---------------+---------+-----------+----------+-------------------+ CFV      Full                                          Pulsatile waveforms +---------+---------------+---------+-----------+----------+-------------------+ SFJ      Full                                                             +---------+---------------+---------+-----------+----------+-------------------+ FV Prox  Full                                                             +---------+---------------+---------+-----------+----------+-------------------+ FV Mid   Full                                                             +---------+---------------+---------+-----------+----------+-------------------+ FV DistalFull                                                             +---------+---------------+---------+-----------+----------+-------------------+ PFV      Full                                                             +---------+---------------+---------+-----------+----------+-------------------+ POP      Full                                         Pulsatile Waveforms +---------+---------------+---------+-----------+----------+-------------------+ PTV                                                   bandages            +---------+---------------+---------+-----------+----------+-------------------+ PERO  bandages            +---------+---------------+---------+-----------+----------+-------------------+   +---------+---------------+---------+-----------+----------+-------------------+ LEFT     CompressibilityPhasicitySpontaneityPropertiesThrombus Aging      +---------+---------------+---------+-----------+----------+-------------------+ CFV      Full                                         Pulsatile Waveforms +---------+---------------+---------+-----------+----------+-------------------+ SFJ      Full                                                              +---------+---------------+---------+-----------+----------+-------------------+ FV Prox  Full                                                             +---------+---------------+---------+-----------+----------+-------------------+ FV Mid   Full                                                             +---------+---------------+---------+-----------+----------+-------------------+ FV DistalFull                                                             +---------+---------------+---------+-----------+----------+-------------------+ PFV      Full                                                             +---------+---------------+---------+-----------+----------+-------------------+ POP      Full                                         Pulsatile Waveforms +---------+---------------+---------+-----------+----------+-------------------+ PTV                                                   bandages            +---------+---------------+---------+-----------+----------+-------------------+ PERO                                                  bandages            +---------+---------------+---------+-----------+----------+-------------------+    Summary: RIGHT: - There is no evidence of deep vein  thrombosis in the lower extremity. However, portions of this examination were limited- see technologist comments above.  LEFT: - There is no evidence of deep vein thrombosis in the lower extremity. However, portions of this examination were limited- see technologist comments above.  *See table(s) above for measurements and observations. Electronically signed by Servando Snare MD on 10/03/2020 at 5:52:39 PM.    Final      Medications:     Scheduled Medications: . sodium chloride   Intravenous Once  . amiodarone  200 mg Oral BID  . ceFAZolin  1 g Other To OR  . Chlorhexidine Gluconate Cloth  6 each Topical Daily  . insulin aspart  0-15 Units Subcutaneous TID WC   . insulin aspart  0-5 Units Subcutaneous QHS  . multivitamin with minerals  1 tablet Oral Daily  . rosuvastatin  40 mg Oral Daily  . sodium chloride flush  10-40 mL Intracatheter Q12H    Infusions: . sodium chloride Stopped (09/28/20 1746)  . DOBUTamine 7.5 mcg/kg/min (10/04/20 0700)  . heparin 1,300 Units/hr (10/04/20 0700)  . norepinephrine (LEVOPHED) Adult infusion 3 mcg/min (10/04/20 0700)    PRN Medications: sodium chloride, acetaminophen, ondansetron (ZOFRAN) IV, ondansetron, sodium chloride flush    Assessment/Plan   1. COVID-19 infection:in 1/22. Manifested primarily as nausea/vomiting. Very slow recovery, did not take meds for at least 3 wks but had recently started back on meds just prior to admission.   - Suspect that nausea/poor appetite was actually due to HF.   2.Acute on chronic systolic HF:ICM.HasMedtronic ICD. Narrow QRS, not CRT candidate. Has required milrinone multiple times in the past for low output HF. Echo 6/19 with EF 20% and moderately decreased RV systolic function.CPX 4/19 with severe functional limitation due to [facility] 6/19 showed low output and coronary angiography showed no interventional targets.Echo in 4/21 showed EF 25%, mildly decreased RV systolic function. CPX in 5/21 showed severe HF limitation. He has been resistant to LVAD/transplant evaluation, both offered with discussions in past. I worry that we will miss the window for advanced therapies. He was not interested in barostimulatoractivation therapy. History of cardiorenal syndrome/diabetic nephropathy, creatinine up to 2.33 in 1/22, likely due to dehydration with nausea/vomiting.He had been off meds for several weeks, just restarted 3-4 days PTA. He remains volume overloaded with low output HF. Milrinone increased to 0.375 on 2/26. On 2/27 switched from milrinone to dobutamine 5 mcg.  On 2/28 dobutamine increased to 7.5 mcg and norepi 3 mcg added. Todays CO-OX improved 64%.   Plan for impella today. I assessed lower extremities. Small partial thickness skin loss on R foot.  - VAD work up has been initiated.  CVP 12-13. Will give 80 mg IV lasix now.   -Continue digoxin 0.125 daily, dig level 0.5  - Off carvedilol with shock - Will hold spiro with ongoing AKI - Off empagliflozin for now with AKI  - Off Entresto with low BP  - Watch renal closely.  2. CAD s/p [id number]:Last cath in 6/19 with patent grafts. No s/s ischemia  -No ASA given apixaban use.  - Continue Crestor 40 mg daily.  3. AKI on CKD Stage 3b: I suspect that this is a combination of cardiorenal syndrome and diabetic nephropathy and possibly ATN. Creatinine continues to rise despite improved co-ox 1.8-> 2.1 -> 2.25->2.5  ->2.4 - continue hemodynamic support  - need to keep MAPs > 70.  4. LK:GMWNUUV of very poor control but hgbA1ctrending down. Most recent 7.7%  - Cover with SSI. 5.  Atrial flutter: s/p DC-CV 08/21/18.He is in NSR currently but note runs of ?flutter vs fibrillation on device interrogation. Very frequent PACs on telemetry -> confirmed on ECG 2/25 - Continue amiodarone 200 mg bid.   - Continue Eliquis. 6. Nausea Likely due to low output heart failure. He was also started on amio ?  7. Wound R Foot Partial thickness skin loss noted. Cover with mepilex border and change every 5 days.    Length of Stay: [place], NP  10/04/2020, 7:43 AM  Advanced Heart Failure Team Pager [phone] (M-F; 7a - 4p)  Please contact [facility] for night-coverage after hours (4p -7a ) and weekends on [email or web address]  Patient seen with NP, agree with the above note.   Stable today on dobutamine 7.5 + NE 3.  Creatinine down 2.48 => 2.4 with lower BUN. CVP 13-14 with co-ox improved to 64%.  He is nauseated this morning.   General: NAD Neck: JVP 16 cm, no thyromegaly or thyroid nodule.  Lungs: Clear to auscultation bilaterally with normal respiratory effort. CV: Nondisplaced PMI.  Heart  regular S1/S2, no S3/S4, 3/6 HSM LLSB/apex.  No peripheral edema.   Abdomen: Soft, nontender, no hepatosplenomegaly, mild distention.  Skin: Intact without lesions or rashes.  Neurologic: Alert and oriented x 3.  Psych: Normal affect. Extremities: No clubbing or cyanosis.  HEENT: Normal.   Co-ox improved today on dual pressors.  Renal function mildly better.  Volume overloaded, suspect this has caused worsening nausea.  - Lasix 80 mg IV x 1 today.  - Continue current inotropes.  - To OR today for Impella/Swan placement.  - Continue LVAD workup.   CRITICAL CARE Performed by: Loralie Champagne  Total critical care time: 35 minutes  Critical care time was exclusive of separately billable procedures and treating other patients.  Critical care was necessary to treat or prevent imminent or life-threatening deterioration.  Critical care was time spent personally by me on the following activities: development of treatment plan with patient and/or surrogate as well as nursing, discussions with consultants, evaluation of patient's response to treatment, examination of patient, obtaining history from patient or surrogate, ordering and performing treatments and interventions, ordering and review of laboratory studies, ordering and review of radiographic studies, pulse oximetry and re-evaluation of patient's condition.   Loralie Champagne 10/04/2020 8:27 AM

## 2020-10-04 NOTE — Progress Notes (Signed)
CRNA's have been at bedside patient arrival to Short Stay.

## 2020-10-04 NOTE — Progress Notes (Signed)
CARDIAC REHAB PHASE I   PRE:  Rate/Rhythm: 90 Afib  BP:  Sitting: 109/86      SaO2: 99 RA  MODE:  Ambulation: 430 ft   POST:  Rate/Rhythm: 106 Afib with PVCs  BP:  Sitting: 125/79    SaO2: 95 RA   Pt ambulated 435ft in hallway assist of one with EVA and gait belt. Pt c/o slight dizziness and nausea upon rising which resolved quickly. Pt able to increase distance past what he originally intended. In good spirits. Pt returned to recliner, call bell and bedside table within reach. Pt denies questions or concerns about upcoming procedures. State he just wants a better quality of life. Will continue to follow as appropriate throughout hospital stay.  [id number] Rufina Falco, RN BSN 10/04/2020 10:57 AM

## 2020-10-04 NOTE — Brief Op Note (Signed)
10/04/2020  6:12 PM  PATIENT:  Marcus Johnson.  60 y.o. male  PRE-OPERATIVE DIAGNOSIS:  CHF  POST-OPERATIVE DIAGNOSIS:  CHF  PROCEDURE:  Procedure(s) with comments: PLACEMENT OF IMPELLA 5.5 LEFT VENTRICULAR ASSIST DEVICE (N/A) - RIGHT AXILLARY TRANSESOPHAGEAL ECHOCARDIOGRAM (TEE) (N/A)  SURGEON:  Surgeon(s) and Role:    * Bartle, Fernande Boyden, MD - Primary  PHYSICIAN ASSISTANT: none  ASSISTANTS: RNFA  ANESTHESIA:   general  EBL:  minimal   BLOOD ADMINISTERED:none  DRAINS: none   LOCAL MEDICATIONS USED:  NONE  SPECIMEN:  No Specimen  DISPOSITION OF SPECIMEN:  N/A  COUNTS:  YES  TOURNIQUET:  * No tourniquets in log *  DICTATION: .Note written in EPIC  PLAN OF CARE: Admit to inpatient   PATIENT DISPOSITION:  ICU - extubated and stable.   Delay start of Pharmacological VTE agent (>24hrs) due to surgical blood loss or risk of bleeding: yes

## 2020-10-05 ENCOUNTER — Encounter (HOSPITAL_COMMUNITY): Payer: Self-pay | Admitting: Surgery

## 2020-10-05 ENCOUNTER — Inpatient Hospital Stay (HOSPITAL_COMMUNITY): Payer: Medicare HMO

## 2020-10-05 DIAGNOSIS — R570 Cardiogenic shock: Secondary | ICD-10-CM

## 2020-10-05 DIAGNOSIS — I5023 Acute on chronic systolic (congestive) heart failure: Secondary | ICD-10-CM

## 2020-10-05 LAB — BASIC METABOLIC PANEL
Anion gap: 13 (ref 5–15)
BUN: 48 mg/dL — ABNORMAL HIGH (ref 6–20)
CO2: 28 mmol/L (ref 22–32)
Calcium: 9.1 mg/dL (ref 8.9–10.3)
Chloride: 96 mmol/L — ABNORMAL LOW (ref 98–111)
Creatinine, Ser: 2.51 mg/dL — ABNORMAL HIGH (ref 0.61–1.24)
GFR, Estimated: 29 mL/min — ABNORMAL LOW (ref >60)
Glucose, Bld: 230 mg/dL — ABNORMAL HIGH (ref 70–99)
Potassium: 3.8 mmol/L (ref 3.5–5.1)
Sodium: 137 mmol/L (ref 135–145)

## 2020-10-05 LAB — COOXEMETRY PANEL
Carboxyhemoglobin: 2.3 % — ABNORMAL HIGH (ref 0.5–1.5)
Methemoglobin: 0.9 % (ref 0.0–1.5)
O2 Saturation: 68.2 %
Total hemoglobin: 11.1 g/dL — ABNORMAL LOW (ref 12.0–16.0)

## 2020-10-05 LAB — CBC
HCT: 35.8 % — ABNORMAL LOW (ref 39.0–52.0)
Hemoglobin: 11 g/dL — ABNORMAL LOW (ref 13.0–17.0)
MCH: 25.2 pg — ABNORMAL LOW (ref 26.0–34.0)
MCHC: 30.7 g/dL (ref 30.0–36.0)
MCV: 81.9 fL (ref 80.0–100.0)
Platelets: 188 10*3/uL (ref 150–400)
RBC: 4.37 MIL/uL (ref 4.22–5.81)
RDW: 20.2 % — ABNORMAL HIGH (ref 11.5–15.5)
WBC: 12.3 10*3/uL — ABNORMAL HIGH (ref 4.0–10.5)
nRBC: 0 % (ref 0.0–0.2)

## 2020-10-05 LAB — HEPARIN LEVEL (UNFRACTIONATED): Heparin Unfractionated: 1.08 IU/mL — ABNORMAL HIGH (ref 0.30–0.70)

## 2020-10-05 LAB — ECHOCARDIOGRAM LIMITED
Height: 70.5 in
Weight: 3079.39 oz

## 2020-10-05 LAB — GLUCOSE, CAPILLARY
Glucose-Capillary: 215 mg/dL — ABNORMAL HIGH (ref 70–99)
Glucose-Capillary: 223 mg/dL — ABNORMAL HIGH (ref 70–99)
Glucose-Capillary: 213 mg/dL — ABNORMAL HIGH (ref 70–99)
Glucose-Capillary: 249 mg/dL — ABNORMAL HIGH (ref 70–99)
Glucose-Capillary: 244 mg/dL — ABNORMAL HIGH (ref 70–99)

## 2020-10-05 LAB — APTT
aPTT: 32 seconds (ref 24–36)
aPTT: 34 seconds (ref 24–36)
aPTT: 38 seconds — ABNORMAL HIGH (ref 24–36)

## 2020-10-05 LAB — LACTATE DEHYDROGENASE: LDH: 352 U/L — ABNORMAL HIGH (ref 98–192)

## 2020-10-05 MED ORDER — LACTATED RINGERS IV SOLN: INTRAVENOUS | Status: DC

## 2020-10-05 MED ORDER — FUROSEMIDE 10 MG/ML IJ SOLN
60.0000 mg | Freq: Once | INTRAMUSCULAR | Status: AC
  Administered 2020-10-05: 60 mg via INTRAVENOUS
  Filled 2020-10-05: qty 6

## 2020-10-05 MED ORDER — SILDENAFIL CITRATE 20 MG PO TABS
20.0000 mg | ORAL_TABLET | Freq: Three times a day (TID) | ORAL | Status: DC
  Administered 2020-10-05 (×3): 20 mg via ORAL
  Filled 2020-10-05 (×3): qty 1

## 2020-10-05 MED ORDER — WHITE PETROLATUM EX OINT: TOPICAL_OINTMENT | CUTANEOUS | Status: DC | PRN

## 2020-10-05 MED ORDER — HYDRALAZINE HCL 25 MG PO TABS
25.0000 mg | ORAL_TABLET | Freq: Three times a day (TID) | ORAL | Status: DC
  Administered 2020-10-05: 25 mg via ORAL
  Filled 2020-10-05: qty 1

## 2020-10-05 MED ORDER — HEPARIN (PORCINE) 25000 UT/250ML-% IV SOLN
850.0000 [IU]/h | INTRAVENOUS | Status: AC
  Administered 2020-10-05: 400 [IU]/h via INTRAVENOUS
  Administered 2020-10-06: 850 [IU]/h via INTRAVENOUS
  Filled 2020-10-05 (×2): qty 250

## 2020-10-05 MED ORDER — INSULIN GLARGINE 100 UNIT/ML ~~LOC~~ SOLN
10.0000 [IU] | Freq: Every day | SUBCUTANEOUS | Status: DC
  Administered 2020-10-05 – 2020-10-12 (×8): 10 [IU] via SUBCUTANEOUS
  Filled 2020-10-05 (×10): qty 0.1

## 2020-10-05 MED ORDER — INSULIN ASPART 100 UNIT/ML ~~LOC~~ SOLN
0.0000 [IU] | Freq: Three times a day (TID) | SUBCUTANEOUS | Status: DC
  Administered 2020-10-05 – 2020-10-06 (×3): 7 [IU] via SUBCUTANEOUS
  Administered 2020-10-07 (×3): 4 [IU] via SUBCUTANEOUS
  Administered 2020-10-08: 7 [IU] via SUBCUTANEOUS
  Administered 2020-10-08: 11 [IU] via SUBCUTANEOUS
  Administered 2020-10-08: 4 [IU] via SUBCUTANEOUS
  Administered 2020-10-09: 7 [IU] via SUBCUTANEOUS
  Administered 2020-10-09: 4 [IU] via SUBCUTANEOUS
  Administered 2020-10-09: 7 [IU] via SUBCUTANEOUS
  Administered 2020-10-10: 4 [IU] via SUBCUTANEOUS
  Administered 2020-10-10: 11 [IU] via SUBCUTANEOUS
  Administered 2020-10-10: 4 [IU] via SUBCUTANEOUS
  Administered 2020-10-11: 11 [IU] via SUBCUTANEOUS
  Administered 2020-10-11: 7 [IU] via SUBCUTANEOUS
  Administered 2020-10-11 – 2020-10-12 (×2): 4 [IU] via SUBCUTANEOUS

## 2020-10-05 MED ORDER — WHITE PETROLATUM EX OINT
TOPICAL_OINTMENT | CUTANEOUS | Status: AC
  Administered 2020-10-05: 0.2 via TOPICAL
  Filled 2020-10-05: qty 28.35

## 2020-10-05 MED ORDER — ALPRAZOLAM 0.25 MG PO TABS
0.2500 mg | ORAL_TABLET | Freq: Two times a day (BID) | ORAL | Status: DC | PRN
  Administered 2020-10-05 – 2020-10-06 (×3): 0.25 mg via ORAL
  Filled 2020-10-05 (×3): qty 1

## 2020-10-05 NOTE — Evaluation (Signed)
Physical Therapy Evaluation Patient Details Name: Marcus Johnson. MRN: [id number] DOB: 1961/06/14 Today's Date: 10/05/2020   History of Present Illness  60 y.o. male who has a h/o DM2, CAD s/p CABG 7371, systolic HF due to ischemic cardiomyopathy with EF 20-25% (echo 12/15), DM2 and CKD. He is s/p Medtronic single chamber ICD. Pt with COVID infection in January 2022 with N/V. Pt admitted for LVAD workup. Pt underwent impella placement on 10/04/2020.  Clinical Impression  Pt presents to PT with deficits in functional mobility, gait, balance, endurance, power, strength. Pt requires assistance and cues to transfer at this time, as well as cueing to maintain line-related precautions. Pt will benefit from further acute PT POC to improve gait and balance in an effort to restore independent mobility. PT will continue to follow and re-evaluate as needed if LVAD placement occurs. At this time PT recommends discharge home with HHPT and a RW.    Follow Up Recommendations Home health PT;Supervision for mobility/OOB (TBD pending progress and potential LVAD)    Equipment Recommendations  Rolling walker with 5" wheels (TBD)    Recommendations for Other Services       Precautions / Restrictions Precautions Precautions: Fall Precaution Comments: swan-ganz, impella Restrictions Weight Bearing Restrictions: Yes RUE Weight Bearing:  (impella)      Mobility  Bed Mobility               General bed mobility comments: pt received and left in recliner    Transfers Overall transfer level: Needs assistance Equipment used: 1 person hand held assist (EVA walker) Transfers: Sit to/from Stand Sit to Stand: Min assist;+2 physical assistance;Min guard (progressing to minG)         General transfer comment: pt requires cues for hand placement and rocking for momentum  Ambulation/Gait Ambulation/Gait assistance: Min guard Gait Distance (Feet): 500 Feet Assistive device:  (EVA walker) Gait  Pattern/deviations: Step-through pattern Gait velocity: functional Gait velocity interpretation: 1.31 - 2.62 ft/sec, indicative of limited community ambulator General Gait Details: pt with slowed step-through gait, reduced stride length  Stairs            Wheelchair Mobility    Modified Rankin (Stroke Patients Only)       Balance Overall balance assessment: Needs assistance Sitting-balance support: No upper extremity supported;Feet supported Sitting balance-Leahy Scale: Fair     Standing balance support: No upper extremity supported Standing balance-Leahy Scale: Fair                               Pertinent Vitals/Pain Pain Assessment: No/denies pain    Home Living Family/patient expects to be discharged to:: Private residence Living Arrangements: Other relatives;Children Available Help at Discharge: Family;Available 24 hours/day Type of Home: Apartment Home Access: Stairs to enter Entrance Stairs-Rails: Psychiatric nurse of Steps: 3 flights Home Layout: One level Home Equipment: None      Prior Function Level of Independence: Independent               Hand Dominance        Extremity/Trunk Assessment   Upper Extremity Assessment Upper Extremity Assessment: Defer to OT evaluation (limited RUE ROM 2/2 impella)    Lower Extremity Assessment Lower Extremity Assessment: Generalized weakness    Cervical / Trunk Assessment Cervical / Trunk Assessment: Normal  Communication   Communication: No difficulties  Cognition Arousal/Alertness: Awake/alert Behavior During Therapy: WFL for tasks assessed/performed Overall Cognitive Status: Impaired/Different from baseline  Area of Impairment: Memory                     Memory: Decreased recall of precautions                General Comments General comments (skin integrity, edema, etc.): VSS on RA    Exercises     Assessment/Plan    PT Assessment Patient  needs continued PT services  PT Problem List Decreased strength;Decreased activity tolerance;Decreased balance;Decreased mobility;Decreased knowledge of use of DME;Decreased knowledge of precautions;Cardiopulmonary status limiting activity       PT Treatment Interventions DME instruction;Gait training;Stair training;Functional mobility training;Therapeutic activities;Therapeutic exercise;Balance training;Patient/family education    PT Goals (Current goals can be found in the Care Plan section)  Acute Rehab PT Goals Patient Stated Goal: to return to independent mobility PT Goal Formulation: With patient Time For Goal Achievement: 10/19/20 Potential to Achieve Goals: Good    Frequency Min 3X/week   Barriers to discharge        Co-evaluation               AM-PAC PT "6 Clicks" Mobility  Outcome Measure Help needed turning from your back to your side while in a flat bed without using bedrails?: A Little Help needed moving from lying on your back to sitting on the side of a flat bed without using bedrails?: A Little Help needed moving to and from a bed to a chair (including a wheelchair)?: A Little Help needed standing up from a chair using your arms (e.g., wheelchair or bedside chair)?: A Little Help needed to walk in hospital room?: A Little Help needed climbing 3-5 steps with a railing? : A Lot 6 Click Score: 17    End of Session   Activity Tolerance: Patient tolerated treatment well Patient left: in chair;with call bell/phone within reach;with nursing/sitter in room Nurse Communication: Mobility status PT Visit Diagnosis: Unsteadiness on feet (R26.81);Other abnormalities of gait and mobility (R26.89);Muscle weakness (generalized) (M62.81)    Time: 4709-6283 PT Time Calculation (min) (ACUTE ONLY): 26 min   Charges:   PT Evaluation $PT Eval Moderate Complexity: 1 Mod PT Treatments $Gait Training: 8-22 mins        Zenaida Niece, PT, DPT Acute Rehabilitation Pager:  [phone]   Zenaida Niece 10/05/2020, 4:01 PM

## 2020-10-05 NOTE — Progress Notes (Signed)
Patient sleeping this am.   Impella placed yesterday afternoon; will monitor creat to see if patient will be LVAD candidate per Dr. Aundra Dubin.  Dr. Benson Norway (dental) spoke with Dr. Aundra Dubin this am; plan for teeth extraction Friday 10/07/20; will be 07:30 case.   Zada Girt RN, [facility] Coordinator [phone]

## 2020-10-05 NOTE — Progress Notes (Signed)
Patient ID: Marcus Johnson., male   DOB: 07/10/1961, 60 y.o.   MRN: [id number]     Advanced Heart Failure Rounding Note  PCP-Cardiologist: No primary care provider on file.   Subjective:    2/27 Milrinone switched to dobutamine 5 mcg.  2/28 Moved to ICU. Dobutamine increased to 7.5 mcg and Norepi 3 mcg added. Diuretics held.  3/1 Impella 5.5 placed.   Creatinine unchanged today at 2.5.   No complaints this morning, no nausea or dyspnea.   Swan numbers: CVP 12 PA 61/12 CI 2.8 Co-ox 68%  Impella 5.5: P8 Flow 4.2 No alarms  Echo: EF 20-25%, normal RV, PASP 61, dilated IVC.    Objective:   Weight Range: 87.3 kg Body mass index is 27.23 kg/m.   Vital Signs:   Temp:  [97.16 F (36.2 C)-98.6 F (37 C)] 97.52 F (36.4 C) (03/02 0600) Pulse Rate:  [87-99] 99 (03/02 0600) Resp:  [11-25] 11 (03/02 0600) BP: (109-137)/(88-102) 109/95 (03/02 0600) SpO2:  [99 %-100 %] 100 % (03/02 0600) Arterial Line BP: (121-143)/(68-85) 121/70 (03/02 0600) Weight:  [87.3 kg] 87.3 kg (03/02 0617) Last BM Date: 10/04/20  Weight change: Filed Weights   10/03/20 0555 10/04/20 0530 10/05/20 0617  Weight: 88.5 kg 87.6 kg 87.3 kg    Intake/Output:   Intake/Output Summary (Last 24 hours) at 10/05/2020 0802 Last data filed at 10/05/2020 0700 Gross per 24 hour  Intake 1437.04 ml  Output 1510 ml  Net -72.96 ml      Physical Exam   CVP 12.  General: NAD Neck: JVP 10 cm, no thyromegaly or thyroid nodule.  Lungs: Clear to auscultation bilaterally with normal respiratory effort. CV: Lateral PMI.  Heart regular S1/S2, no S3/S4, no murmur.  No peripheral edema.    Abdomen: Soft, nontender, no hepatosplenomegaly, no distention.  Skin: Intact without lesions or rashes.  Neurologic: Alert and oriented x 3.  Psych: Normal affect. Extremities: No clubbing or cyanosis.  HEENT: Normal.    Telemetry  NSR 100s with PACs (personally reviewed)  Labs    CBC Recent Labs     10/04/20 1823 10/05/20 0504  WBC 7.7 12.3*  HGB 11.2* 11.0*  HCT 37.8* 35.8*  MCV 82.7 81.9  PLT 173 259   Basic Metabolic Panel Recent Labs    10/03/20 2200 10/04/20 0444 10/04/20 2006 10/05/20 0504  NA 133*   < > 137 137  K 3.6   < > 3.7 3.8  CL 91*   < > 95* 96*  CO2 29   < > 27 28  GLUCOSE 296*   < > 215* 230*  BUN 54*   < > 48* 48*  CREATININE 2.48*   < > 2.47* 2.51*  CALCIUM 9.2   < > 9.1 9.1  MG 2.5*  --  2.7*  --    < > = values in this interval not displayed.   Liver Function Tests No results for input(s): AST, ALT, ALKPHOS, BILITOT, PROT, ALBUMIN in the last 72 hours. No results for input(s): LIPASE, AMYLASE in the last 72 hours. Cardiac Enzymes No results for input(s): CKTOTAL, CKMB, CKMBINDEX, TROPONINI in the last 72 hours.  BNP: BNP (last 3 results) No results for input(s): BNP in the last 8760 hours.  ProBNP (last 3 results) No results for input(s): PROBNP in the last 8760 hours.   D-Dimer No results for input(s): DDIMER in the last 72 hours. Hemoglobin A1C No results for input(s): HGBA1C in the last 72 hours.  Fasting Lipid Panel No results for input(s): CHOL, HDL, LDLCALC, TRIG, CHOLHDL, LDLDIRECT in the last 72 hours. Thyroid Function Tests No results for input(s): TSH, T4TOTAL, T3FREE, THYROIDAB in the last 72 hours.  Invalid input(s): FREET3  Other results:   Imaging    DG Chest 1 View  Result Date: 10/04/2020 CLINICAL DATA:  Impella placement. EXAM: DG C-ARM 1-60 MIN; CHEST  1 VIEW FLUOROSCOPY TIME:  Fluoroscopy Time:  49 seconds Radiation Exposure Index (if provided by the fluoroscopic device): 114.02 mGy Number of Acquired Spot Images: 1 COMPARISON:  Chest radiograph February twenty-third, 2022. FINDINGS: Single C-arm fluoroscopic image was obtained intraoperatively and submitted for post operative interpretation. This demonstrates an Impella device with the tip projecting in the expected location of the left ventricle. Previously  placed dual lead left subclavian approach cardiac rhythm maintenance device is also noted. Please see the performing provider's procedural report for further detail. IMPRESSION: Intraoperative fluoroscopic image, as detailed above. Electronically Signed   By: Margaretha Sheffield MD   On: 10/04/2020 18:08   DG CHEST PORT 1 VIEW  Result Date: 10/04/2020 CLINICAL DATA:  Impella device placement EXAM: PORTABLE CHEST 1 VIEW COMPARISON:  09/28/2020 FINDINGS: Single frontal view of the chest demonstrates stable AICD. Impella device via right subclavian approach overlies the aortic root and left ventricle. Right internal jugular flow directed central venous catheter overlies main pulmonary outflow tract. Cardiac silhouette remains enlarged. Continued central vascular congestion without overt edema. No effusion or pneumothorax. IMPRESSION: 1. Support devices as above. 2. Continued central vascular congestion without overt edema. Electronically Signed   By: Randa Ngo M.D.   On: 10/04/2020 19:02   DG C-Arm 1-60 Min  Result Date: 10/04/2020 CLINICAL DATA:  Impella placement. EXAM: DG C-ARM 1-60 MIN; CHEST  1 VIEW FLUOROSCOPY TIME:  Fluoroscopy Time:  49 seconds Radiation Exposure Index (if provided by the fluoroscopic device): 114.02 mGy Number of Acquired Spot Images: 1 COMPARISON:  Chest radiograph February twenty-third, 2022. FINDINGS: Single C-arm fluoroscopic image was obtained intraoperatively and submitted for post operative interpretation. This demonstrates an Impella device with the tip projecting in the expected location of the left ventricle. Previously placed dual lead left subclavian approach cardiac rhythm maintenance device is also noted. Please see the performing provider's procedural report for further detail. IMPRESSION: Intraoperative fluoroscopic image, as detailed above. Electronically Signed   By: Margaretha Sheffield MD   On: 10/04/2020 18:08     Medications:     Scheduled Medications: .  sodium chloride   Intravenous Once  . amiodarone  200 mg Oral BID  . bisacodyl  10 mg Rectal Once  . Chlorhexidine Gluconate Cloth  6 each Topical Daily  . furosemide  60 mg Intravenous Once  . hydrALAZINE  25 mg Oral Q8H  . insulin aspart  0-15 Units Subcutaneous TID WC  . insulin aspart  0-5 Units Subcutaneous QHS  . mouth rinse  15 mL Mouth Rinse BID  . multivitamin with minerals  1 tablet Oral Daily  . rosuvastatin  40 mg Oral Daily  . sildenafil  20 mg Oral TID  . sodium chloride flush  10-40 mL Intracatheter Q12H    Infusions: . sodium chloride Stopped (09/28/20 1746)  . DOBUTamine 4 mcg/kg/min (10/05/20 0700)  . heparin    . impella catheter heparin 50 unit/mL in dextrose 5%    . norepinephrine (LEVOPHED) Adult infusion Stopped (10/04/20 1745)    PRN Medications: sodium chloride, acetaminophen, HYDROmorphone (DILAUDID) injection, ondansetron (ZOFRAN) IV, ondansetron, oxyCODONE, sodium  chloride flush    Assessment/Plan   1. COVID-19 infection:in 1/22. Manifested primarily as nausea/vomiting. Very slow recovery, did not take meds for at least 3 wks but had recently started back on meds just prior to admission.   - Suspect that nausea/poor appetite was actually due to HF.   2.Acute on chronic systolic HF:ICM.HasMedtronic ICD. Narrow QRS, not CRT candidate. Has required milrinone multiple times in the past for low output HF. Echo 6/19 with EF 20% and moderately decreased RV systolic function.CPX 4/19 with severe functional limitation due to [facility] 6/19 showed low output and coronary angiography showed no interventional targets.Echo in 4/21 showed EF 25%, mildly decreased RV systolic function. CPX in 5/21 showed severe HF limitation. He has been resistant to LVAD/transplant evaluation, both offered with discussions in past. I worry that we will miss the window for advanced therapies. He was not interested in barostimulatoractivation therapy. History of  cardiorenal syndrome/diabetic nephropathy, creatinine up to 2.33 in 1/22, likely due to dehydration with nausea/vomiting.He had been off meds for several weeks, just restarted 3-4 days PTA. Low output persisted despite milrinone and then dobutamine with rise in creatinine to 2.5. Impella 5.5 placed 3/1.  This morning, CVP 12 with CI 2.8. PA pressure remains high with PASP in 60s.  - VAD workup has been initiated, renal function is limiting factor currently.  Has teeth that need removal, will need to discuss with hospital dentist.  - Continue Impella P8, check position under echo today.  Start heparin gtt.  Will follow LDH (draw today).  - Lasix 60 mg IV x 1 today.  - Decrease dobutamine to 2.  - Add hydralazine 25 mg tid with MAP.  - Add sildenafil 20 tid with pulmonary hypertension.  - Off carvedilol with low output - Will hold spiro with ongoing AKI - Off empagliflozin for now with AKI  - Off Entresto with low BP  2. CAD s/p CABG2002:Last cath in 6/19 with patent grafts. No s/s ischemia  -No ASA given apixaban use.  - Continue Crestor 40 mg daily.  3. AKI on CKD Stage 3b: I suspect that this is a combination of cardiorenal syndrome and diabetic nephropathy and possibly ATN. Creatinine stable 1.8 -> 2.1 -> 2.25 -> 2.5  -> 2.4 -> 2.5. Need creatinine to trend down for LVAD.  - continue hemodynamic support  4. CL:EXNTZGY of very poor control but hgbA1ctrending down. Most recent 7.7%  - Cover with SSI. 5. Atrial flutter: s/p DC-CV 08/21/18.He is in NSR currently but note runs of ?flutter vs fibrillation on device interrogation. Very frequent PACs on telemetry -> confirmed on ECG 2/25 - Continue amiodarone 200 mg bid.   - Heparin gtt with Impella.  6. Wound R Foot: Partial thickness skin loss noted. Cover with mepilex border and change every 5 days.   CRITICAL CARE Performed by: Loralie Champagne  Total critical care time: 40 minutes  Critical care time was exclusive of  separately billable procedures and treating other patients.  Critical care was necessary to treat or prevent imminent or life-threatening deterioration.  Critical care was time spent personally by me on the following activities: development of treatment plan with patient and/or surrogate as well as nursing, discussions with consultants, evaluation of patient's response to treatment, examination of patient, obtaining history from patient or surrogate, ordering and performing treatments and interventions, ordering and review of laboratory studies, ordering and review of radiographic studies, pulse oximetry and re-evaluation of patient's condition.   Length of Stay: 7  [place]  Aundra Dubin, MD  10/05/2020, 8:02 AM  Advanced Heart Failure Team Pager [phone] (M-F; [place])  Please contact [facility] for night-coverage after hours (4p -7a ) and weekends on [email or web address]

## 2020-10-05 NOTE — Anesthesia Postprocedure Evaluation (Signed)
Anesthesia Post Note  Patient: Marcus Johnson.  Procedure(s) Performed: PLACEMENT OF IMPELLA 5.5 LEFT VENTRICULAR ASSIST DEVICE (N/A ) TRANSESOPHAGEAL ECHOCARDIOGRAM (TEE) (N/A )     Patient location during evaluation: PACU Anesthesia Type: General Level of consciousness: awake and alert Pain management: pain level controlled Vital Signs Assessment: post-procedure vital signs reviewed and stable Respiratory status: spontaneous breathing, nonlabored ventilation, respiratory function stable and patient connected to nasal cannula oxygen Cardiovascular status: blood pressure returned to baseline and stable Postop Assessment: no apparent nausea or vomiting Anesthetic complications: no   No complications documented.  Last Vitals:  Vitals:   10/05/20 1000 10/05/20 1100  BP: 107/67 (!) 94/57  Pulse: 96 100  Resp: 17 (!) 21  Temp: 36.7 C 37 C  SpO2: 98% 96%    Last Pain:  Vitals:   10/05/20 0800  TempSrc: Core  PainSc: 3                  Tiajuana Amass

## 2020-10-05 NOTE — Plan of Care (Signed)
  Problem: Education: Goal: Knowledge of General Education information will improve Description: Including pain rating scale, medication(s)/side effects and non-pharmacologic comfort measures Outcome: Progressing   Problem: Health Behavior/Discharge Planning: Goal: Ability to manage health-related needs will improve Outcome: Progressing   Problem: Clinical Measurements: Goal: Ability to maintain clinical measurements within normal limits will improve Outcome: Progressing Goal: Will remain free from infection Outcome: Progressing Goal: Diagnostic test results will improve Outcome: Progressing Goal: Cardiovascular complication will be avoided Outcome: Progressing   Problem: Activity: Goal: Risk for activity intolerance will decrease Outcome: Progressing   Problem: Nutrition: Goal: Adequate nutrition will be maintained Outcome: Progressing   Problem: Coping: Goal: Level of anxiety will decrease Outcome: Progressing   Problem: Pain Managment: Goal: General experience of comfort will improve Outcome: Progressing   Problem: Cardiac: Goal: Ability to achieve and maintain adequate cardiopulmonary perfusion will improve Outcome: Progressing   Problem: Clinical Measurements: Goal: Respiratory complications will improve Outcome: Completed/Met

## 2020-10-05 NOTE — Progress Notes (Signed)
Orthopedic Tech Progress Note Patient Details:  Marcus Johnson 1961-07-19 [id number] RN called requesting an ARM SLING. Dropped off to PATIENT. Patient asked when could I start back doing his unna boots? Told him I was not sure. Whenever MD said I can.  Ortho Devices Type of Ortho Device: Arm sling Ortho Device/Splint Location: RUE Ortho Device/Splint Interventions: Adjustment,Other (comment)   Post Interventions Patient Tolerated: Well Instructions Provided: Care of [place] 10/05/2020, 12:13 PM

## 2020-10-05 NOTE — Progress Notes (Signed)
  Echocardiogram 2D Echocardiogram has been performed.  Marcus Johnson 10/05/2020, 10:27 AM

## 2020-10-05 NOTE — Progress Notes (Signed)
[place] for heparin Indication: atrial fibrillation  Allergies  Allergen Reactions  . Meclizine Hcl Anaphylaxis and Swelling  . Ivabradine Nausea Only    Patient Measurements: Height: 5' 10.5" (179.1 cm) Weight: 87.3 kg (192 lb 7.4 oz) IBW/kg (Calculated) : 74.15 Heparin Dosing Weight: 88.5 kg   Vital Signs: Temp: 98.1 F (36.7 C) (03/02 2100) Temp Source: Core (03/02 2000) BP: 88/70 (03/02 2000) Pulse Rate: 94 (03/02 2100)  Labs: Recent Labs    10/03/20 2112 10/03/20 2200 10/04/20 0444 10/04/20 1632 10/04/20 1823 10/04/20 2006 10/05/20 0504 10/05/20 1433 10/05/20 2126  HGB  --   --  11.4* 12.9* 11.2*  --  11.0*  --   --   HCT  --   --  37.0* 38.0* 37.8*  --  35.8*  --   --   PLT  --   --  180  --  173  --  188  --   --   APTT  --    < > 65*  --   --   --  32 34 38*  HEPARINUNFRC >2.20*  --  2.08*  --   --   --  1.08*  --   --   CREATININE  --    < > 2.40* 2.30*  --  2.47* 2.51*  --   --    < > = values in this interval not displayed.    Estimated Creatinine Clearance: 33.3 mL/min (A) (by C-G formula based on SCr of 2.51 mg/dL (H)).   Assessment: 89 yom presenting with volume overload with low output HF - plan for impella placement while undergoing LVAD w/u. On apixaban PTA for hx Afib - LD 2/27@2144 .  Patient s/p Impella placement on 3/1 and started on heparin purge (50 units/mL).  After discussion with Dr. Cyndia Bent and Dr. Aundra Dubin this AM, decision made to start lower-dose systemic heparin and titrate up slowly. Patient did have oozing requiring dressing change of Impella site overnight, but this stabilized by morning rounds.  PM update - APTT remains subtherapeutic (38) after systemic heparin rate increase earlier this AM. Heparin level remains falsely elevated given recent Eliquis. Will dose based on aPTT until levels correlate. CBC stable. No bleeding or issues with infusion per discussion with RN. Impella remains at P8,  pressures stable in 500s, purge flow stable 6.1 ml/hr (~300 units/hr heparin through purge).  Goal of Therapy:  APTT 56-85 sec Heparin level 0.2-0.5 units/ml Monitor platelets by anticoagulation protocol: Yes   Plan:  Increase systemic IV heparin to 600 units/hr Continue Impella heparin purge 50 units/ml  Monitor aPTT/HL Q6 hrs for the first 24 hrs, then Q12 hrs until therapeutic Monitor daily CBC, s/sx bleeding   Arturo Morton, PharmD, BCPS Please check AMION for all [place] contact numbers Clinical Pharmacist 10/05/2020 9:59 PM

## 2020-10-05 NOTE — Progress Notes (Signed)
This chaplain is present for F/U spiritual care and confirming the Pt. healthcare agent.  The Pt. is sitting up in bed and joined by his son-Cody and brother [name] at the bedside.  The Pt. confirmed with the chaplain the Pt. son-Cody Reth is his surrogate healthcare decision maker following [employer].  The chaplain understands the Pt. has no interest in legally documenting his decision.  This chaplain is available for F/U spiritual care as needed.

## 2020-10-05 NOTE — Progress Notes (Addendum)
[place] for heparin Indication: atrial fibrillation  Allergies  Allergen Reactions  . Meclizine Hcl Anaphylaxis and Swelling  . Ivabradine Nausea Only    Patient Measurements: Height: 5' 10.5" (179.1 cm) Weight: 87.3 kg (192 lb 7.4 oz) IBW/kg (Calculated) : 74.15 Heparin Dosing Weight: 88.5 kg   Vital Signs: Temp: 98.6 F (37 C) (03/02 1100) Temp Source: Core (03/02 0800) BP: 94/57 (03/02 1100) Pulse Rate: 100 (03/02 1100)  Labs: Recent Labs    10/03/20 2112 10/03/20 2200 10/04/20 0444 10/04/20 1632 10/04/20 1823 10/04/20 2006 10/05/20 0504  HGB  --   --  11.4* 12.9* 11.2*  --  11.0*  HCT  --   --  37.0* 38.0* 37.8*  --  35.8*  PLT  --   --  180  --  173  --  188  APTT  --  60* 65*  --   --   --  32  HEPARINUNFRC >2.20*  --  2.08*  --   --   --  1.08*  CREATININE  --  2.48* 2.40* 2.30*  --  2.47* 2.51*    Estimated Creatinine Clearance: 33.3 mL/min (A) (by C-G formula based on SCr of 2.51 mg/dL (H)).   Assessment: 66 yom presenting with volume overload with low output HF - plan for impella placement while undergoing LVAD w/u. On apixaban PTA for hx Afib - LD 2/27@2144 .  Patient s/p Impella placement on 3/1 and started on heparin purge (50 units/mL). Currently set at P8 with stable pressures in upper 500s. Purge flow stable ~5.7 ml/hr (~285 units/hr).   After discussion with Dr. Cyndia Bent and Dr. Aundra Dubin this AM, decision made to start lower-dose systemic heparin and titrate up slowly (will target total ~8 units/kg/hr of heparin initially). Patient did have oozing requiring dressing change of Impella site overnight, but this stabilized by morning rounds.  APTT subtherapeutic (32) as expected while on heparin purge this AM. Heparin level remains falsely elevated given recent Eliquis. Will dose based off aPTT until levels correlate.  Goal of Therapy:  APTT 56-85 sec Heparin level 0.2-0.5 units/ml Monitor platelets by  anticoagulation protocol: Yes   Plan:  Start systemic IV heparin at 400 units/hr Continue Impella heparin purge 50 units/ml  Monitor aPTT/HL Q6 hrs for the first 24 hrs, then Q12 hrs until therapeutic Monitor daily CBC, s/sx bleeding  ====================================== PM Addendum: aPTT remains subtherapeutic at 34 sec after starting systemic anticoagulation. No overt bleeding or infusion issues per discussion with nursing. Impella remains at P8, pressures stable in 500s, purge flow up to 6 ml/hr (~300 units/hr heparin through purge).   Plan:  Increase systemic IV heparin to 500 units/hr Continue Impella heparin purge 50 units/ml  Monitor aPTT/HL Q6 hrs for the first 24 hrs, then Q12 hrs until therapeutic Monitor daily CBC, s/sx bleeding  [name], PharmD, BCPS PGY2 Cardiology Pharmacy Resident Phone: [phone] 10/05/2020  12:29 PM  Please check [email or web address] for unit-specific pharmacy phone numbers.

## 2020-10-05 NOTE — Progress Notes (Signed)
1 Day Post-Op Procedure(s) (LRB): PLACEMENT OF IMPELLA 5.5 LEFT VENTRICULAR ASSIST DEVICE (N/A) TRANSESOPHAGEAL ECHOCARDIOGRAM (TEE) (N/A) Subjective:  Some pain in right arm and numbness in hand but moving it fine.  Nausea from oxycodone. Dilaudid has been working for pain but makes him sleepy.  PA 60/20, CI 2.8, CVP 11, Co-ox 68 on dobut 4 Impella P8 with flow 4.2.  Creat 2.5.  Objective: Vital signs in last 24 hours: Temp:  [97.16 F (36.2 C)-98.6 F (37 C)] 97.52 F (36.4 C) (03/02 0600) Pulse Rate:  [87-99] 99 (03/02 0600) Cardiac Rhythm: Normal sinus rhythm (03/02 0400) Resp:  [11-25] 11 (03/02 0600) BP: (109-137)/(88-102) 109/95 (03/02 0600) SpO2:  [99 %-100 %] 100 % (03/02 0600) Arterial Line BP: (121-143)/(68-85) 121/70 (03/02 0600) Weight:  [87.3 kg] 87.3 kg (03/02 0617)  Hemodynamic parameters for last 24 hours: PAP: (58-73)/(17-31) 58/18 CVP:  [11 mmHg-15 mmHg] 11 mmHg CO:  [4.4 L/min-5.8 L/min] 5.8 L/min CI:  [2.1 L/min/m2-2.8 L/min/m2] 2.8 L/min/m2  Intake/Output from previous day: 03/01 0701 - 03/02 0700 In: 1471.2 [I.V.:1409.8] Out: 1510 [Urine:1460; Blood:50] Intake/Output this shift: No intake/output data recorded.  General appearance: alert and cooperative Neurologic: intact Heart: regular rate and rhythm Lungs: clear to auscultation bilaterally Extremities: edema mild  Wound: right axillary dressing has minimal drainage around the Impella exit site.  Lab Results: Recent Labs    10/04/20 1823 10/05/20 0504  WBC 7.7 12.3*  HGB 11.2* 11.0*  HCT 37.8* 35.8*  PLT 173 188   BMET:  Recent Labs    10/04/20 2006 10/05/20 0504  NA 137 137  K 3.7 3.8  CL 95* 96*  CO2 27 28  GLUCOSE 215* 230*  BUN 48* 48*  CREATININE 2.47* 2.51*  CALCIUM 9.1 9.1    PT/INR: No results for input(s): LABPROT, INR in the last 72 hours. ABG    Component Value Date/Time   HCO3 29.3 (H) 01/10/2018 0808   TCO2 28 10/04/2020 1632   ACIDBASEDEF 3.0 (H)  01/18/2015 0823   O2SAT 68.2 10/05/2020 0504   CBG (last 3)  Recent Labs    10/04/20 2215 10/05/20 0314 10/05/20 0637  GLUCAP 206* 215* 223*    Assessment/Plan:  POD 1 S/P Procedure(s) (LRB): PLACEMENT OF IMPELLA 5.5 LEFT VENTRICULAR ASSIST DEVICE (N/A) TRANSESOPHAGEAL ECHOCARDIOGRAM (TEE) (N/A)  Hemodynamics stable with Impella. Co-ox and CI good.  Pulmonary HTN. Dr. Aundra Dubin adding Revatio and hydralazine. Gentle diuresis and follow creat.  Impella site looks ok so systemic heparin can be started.  Would like to mobilize and sit up to prevent debilitation.   LOS: 7 days    Gaye Pollack 10/05/2020

## 2020-10-06 DIAGNOSIS — Z95811 Presence of heart assist device: Secondary | ICD-10-CM | POA: Diagnosis not present

## 2020-10-06 DIAGNOSIS — I5023 Acute on chronic systolic (congestive) heart failure: Secondary | ICD-10-CM | POA: Diagnosis not present

## 2020-10-06 LAB — CBC
HCT: 33.2 % — ABNORMAL LOW (ref 39.0–52.0)
Hemoglobin: 10.3 g/dL — ABNORMAL LOW (ref 13.0–17.0)
MCH: 25.2 pg — ABNORMAL LOW (ref 26.0–34.0)
MCHC: 31 g/dL (ref 30.0–36.0)
MCV: 81.4 fL (ref 80.0–100.0)
Platelets: 188 10*3/uL (ref 150–400)
RBC: 4.08 MIL/uL — ABNORMAL LOW (ref 4.22–5.81)
RDW: 19.9 % — ABNORMAL HIGH (ref 11.5–15.5)
WBC: 12.1 10*3/uL — ABNORMAL HIGH (ref 4.0–10.5)
nRBC: 0 % (ref 0.0–0.2)

## 2020-10-06 LAB — GLUCOSE, CAPILLARY
Glucose-Capillary: 172 mg/dL — ABNORMAL HIGH (ref 70–99)
Glucose-Capillary: 149 mg/dL — ABNORMAL HIGH (ref 70–99)
Glucose-Capillary: 68 mg/dL — ABNORMAL LOW (ref 70–99)
Glucose-Capillary: 249 mg/dL — ABNORMAL HIGH (ref 70–99)
Glucose-Capillary: 215 mg/dL — ABNORMAL HIGH (ref 70–99)

## 2020-10-06 LAB — HEPARIN LEVEL (UNFRACTIONATED): Heparin Unfractionated: 1.07 IU/mL — ABNORMAL HIGH (ref 0.30–0.70)

## 2020-10-06 LAB — APTT
aPTT: 39 seconds — ABNORMAL HIGH (ref 24–36)
aPTT: 41 seconds — ABNORMAL HIGH (ref 24–36)
aPTT: 51 seconds — ABNORMAL HIGH (ref 24–36)

## 2020-10-06 LAB — BASIC METABOLIC PANEL
Anion gap: 10 (ref 5–15)
Anion gap: 10 (ref 5–15)
BUN: 45 mg/dL — ABNORMAL HIGH (ref 6–20)
BUN: 45 mg/dL — ABNORMAL HIGH (ref 6–20)
CO2: 30 mmol/L (ref 22–32)
CO2: 28 mmol/L (ref 22–32)
Calcium: 8.8 mg/dL — ABNORMAL LOW (ref 8.9–10.3)
Calcium: 8.9 mg/dL (ref 8.9–10.3)
Chloride: 96 mmol/L — ABNORMAL LOW (ref 98–111)
Chloride: 96 mmol/L — ABNORMAL LOW (ref 98–111)
Creatinine, Ser: 2.47 mg/dL — ABNORMAL HIGH (ref 0.61–1.24)
Creatinine, Ser: 2.51 mg/dL — ABNORMAL HIGH (ref 0.61–1.24)
GFR, Estimated: 29 mL/min — ABNORMAL LOW (ref >60)
GFR, Estimated: 29 mL/min — ABNORMAL LOW (ref >60)
Glucose, Bld: 182 mg/dL — ABNORMAL HIGH (ref 70–99)
Glucose, Bld: 268 mg/dL — ABNORMAL HIGH (ref 70–99)
Potassium: 3.3 mmol/L — ABNORMAL LOW (ref 3.5–5.1)
Potassium: 3.7 mmol/L (ref 3.5–5.1)
Sodium: 136 mmol/L (ref 135–145)
Sodium: 134 mmol/L — ABNORMAL LOW (ref 135–145)

## 2020-10-06 LAB — LACTATE DEHYDROGENASE: LDH: 384 U/L — ABNORMAL HIGH (ref 98–192)

## 2020-10-06 LAB — COOXEMETRY PANEL
Carboxyhemoglobin: 2.4 % — ABNORMAL HIGH (ref 0.5–1.5)
Methemoglobin: 0.8 % (ref 0.0–1.5)
O2 Saturation: 61.8 %
Total hemoglobin: 13.3 g/dL (ref 12.0–16.0)

## 2020-10-06 LAB — MAGNESIUM: Magnesium: 2.7 mg/dL — ABNORMAL HIGH (ref 1.7–2.4)

## 2020-10-06 LAB — FACTOR 5 LEIDEN

## 2020-10-06 MED ORDER — FUROSEMIDE 10 MG/ML IJ SOLN
60.0000 mg | Freq: Two times a day (BID) | INTRAMUSCULAR | Status: AC
  Administered 2020-10-06 (×2): 60 mg via INTRAVENOUS
  Filled 2020-10-06 (×2): qty 6

## 2020-10-06 MED ORDER — ENSURE ENLIVE PO LIQD
237.0000 mL | Freq: Two times a day (BID) | ORAL | Status: DC
  Administered 2020-10-06 – 2020-10-12 (×11): 237 mL via ORAL

## 2020-10-06 MED ORDER — CEFAZOLIN SODIUM-DEXTROSE 2-4 GM/100ML-% IV SOLN
2.0000 g | INTRAVENOUS | Status: AC
  Administered 2020-10-07: 2 g via INTRAVENOUS
  Filled 2020-10-06 (×2): qty 100

## 2020-10-06 MED ORDER — OXYCODONE-ACETAMINOPHEN 5-325 MG PO TABS
1.0000 | ORAL_TABLET | Freq: Four times a day (QID) | ORAL | Status: DC | PRN
  Administered 2020-10-06 – 2020-10-08 (×7): 1 via ORAL
  Filled 2020-10-06 (×7): qty 1

## 2020-10-06 MED ORDER — POTASSIUM CHLORIDE CRYS ER 20 MEQ PO TBCR
40.0000 meq | EXTENDED_RELEASE_TABLET | ORAL | Status: AC
  Administered 2020-10-06 (×2): 40 meq via ORAL
  Filled 2020-10-06 (×2): qty 2

## 2020-10-06 MED ORDER — SILDENAFIL CITRATE 20 MG PO TABS
40.0000 mg | ORAL_TABLET | Freq: Three times a day (TID) | ORAL | Status: DC
  Administered 2020-10-06 – 2020-10-12 (×20): 40 mg via ORAL
  Filled 2020-10-06 (×20): qty 2

## 2020-10-06 MED ORDER — ALBUMIN HUMAN 5 % IV SOLN
12.5000 g | Freq: Once | INTRAVENOUS | Status: AC
  Administered 2020-10-06: 12.5 g via INTRAVENOUS
  Filled 2020-10-06: qty 250

## 2020-10-06 MED ORDER — POTASSIUM CHLORIDE CRYS ER 20 MEQ PO TBCR
40.0000 meq | EXTENDED_RELEASE_TABLET | ORAL | Status: DC
  Administered 2020-10-06 (×2): 40 meq via ORAL
  Filled 2020-10-06 (×2): qty 2

## 2020-10-06 MED ORDER — PROSOURCE PLUS PO LIQD
30.0000 mL | Freq: Two times a day (BID) | ORAL | Status: DC
  Administered 2020-10-06 – 2020-10-12 (×6): 30 mL via ORAL
  Filled 2020-10-06 (×6): qty 30

## 2020-10-06 MED ORDER — GABAPENTIN 600 MG PO TABS
300.0000 mg | ORAL_TABLET | Freq: Three times a day (TID) | ORAL | Status: DC
  Administered 2020-10-06 – 2020-10-12 (×20): 300 mg via ORAL
  Filled 2020-10-06 (×20): qty 1

## 2020-10-06 NOTE — Progress Notes (Addendum)
[place] for heparin Indication: atrial fibrillation  Allergies  Allergen Reactions  . Meclizine Hcl Anaphylaxis and Swelling  . Ivabradine Nausea Only    Patient Measurements: Height: 5' 10.5" (179.1 cm) Weight: 81.6 kg (179 lb 14.3 oz) IBW/kg (Calculated) : 74.15 Heparin Dosing Weight: 88.5 kg   Vital Signs: Temp: 98.6 F (37 C) (03/03 2145) Temp Source: Core (03/03 1600) BP: 93/69 (03/03 2100) Pulse Rate: 97 (03/03 2145)  Labs: Recent Labs    10/04/20 0444 10/04/20 1632 10/04/20 1823 10/04/20 2006 10/05/20 0504 10/05/20 1433 10/06/20 0431 10/06/20 1327 10/06/20 2115  HGB 11.4*   < > 11.2*  --  11.0*  --  10.3*  --   --   HCT 37.0*   < > 37.8*  --  35.8*  --  33.2*  --   --   PLT 180  --  173  --  188  --  188  --   --   APTT 65*  --   --   --  32   < > 39* 41* 51*  HEPARINUNFRC 2.08*  --   --   --  1.08*  --  1.07*  --   --   CREATININE 2.40*   < >  --    < > 2.51*  --  2.47* 2.51*  --    < > = values in this interval not displayed.    Estimated Creatinine Clearance: 33.3 mL/min (A) (by C-G formula based on SCr of 2.51 mg/dL (H)).   Assessment: 7 yom presenting with volume overload with low output HF - plan for impella placement while undergoing LVAD w/u. On apixaban PTA for hx Afib - LD 2/27@2144 .  Patient s/p Impella placement on 3/1 and started on heparin purge (50 units/mL).  After discussion with Dr. Cyndia Bent and Dr. Aundra Dubin 3/2 AM, decision made to start lower-dose systemic heparin and titrate up slowly. Heparin level falsely elevated (affected by apixaban). -impella purge@ 68ml/hr (300 units/hr) -systemic heparin @ 800 units/hr -aPTT= 51 with trend up   Goal of Therapy:  APTT 56-85 sec Heparin level 0.2-0.5 units/ml Monitor platelets by anticoagulation protocol: Yes   Plan:  -Increase heparin to 850 units/hr -Follow aPTT trend and will titrate slowly -aPTT in am  Hildred Laser, PharmD Clinical  Pharmacist **Pharmacist phone directory can now be found on [email or web address] ([email or web address]).  Listed under [place].

## 2020-10-06 NOTE — Progress Notes (Addendum)
Patient ID: Marcus Holliman., male   DOB: Mar 29, 1961, 60 y.o.   MRN: [id number]     Advanced Heart Failure Rounding Note  PCP-Cardiologist: No primary care provider on file.   Subjective:    2/27 Milrinone switched to dobutamine 5 mcg.  2/28 Moved to ICU. Dobutamine increased to 7.5 mcg and Norepi 3 mcg added. Diuretics held.  3/1 Impella 5.5 placed.   Creatinine 2.47 today.    Main complaint today is right arm neuropathic pain s/p Impella.   He walked in hall with PT yesterday.   [place] numbers: CVP 14 PA 52/12 CI 2.7 Co-ox 62%  Impella 5.5: P8 Flow 4.2 No alarms LDH 384 Hgb 10.3 plts 188  Echo: EF 20-25%, normal RV, PASP 61, dilated IVC.    Objective:   Weight Range: 81.6 kg Body mass index is 25.45 kg/m.   Vital Signs:   Temp:  [97.34 F (36.3 C)-99.3 F (37.4 C)] 98.2 F (36.8 C) (03/03 0700) Pulse Rate:  [87-109] 93 (03/03 0700) Resp:  [8-28] 14 (03/03 0700) BP: (83-116)/(56-90) 95/68 (03/03 0600) SpO2:  [90 %-100 %] 97 % (03/03 0700) Arterial Line BP: (87-124)/(52-78) 111/64 (03/03 0700) Weight:  [81.6 kg] 81.6 kg (03/03 0500) Last BM Date: 10/05/20  Weight change: Filed Weights   10/04/20 0530 10/05/20 0617 10/06/20 0500  Weight: 87.6 kg 87.3 kg 81.6 kg    Intake/Output:   Intake/Output Summary (Last 24 hours) at 10/06/2020 0738 Last data filed at 10/06/2020 0700 Gross per 24 hour  Intake 2264.54 ml  Output 1920 ml  Net 344.54 ml      Physical Exam   CVP 14.  General: NAD Neck: JVP 10-12 cm, no thyromegaly or thyroid nodule.  Lungs: Clear to auscultation bilaterally with normal respiratory effort. CV: Lateral PMI.  Heart irregular S1/S2, no S3/S4, no murmur.  No peripheral edema.    Abdomen: Soft, nontender, no hepatosplenomegaly, no distention.  Skin: Intact without lesions or rashes.  Neurologic: Alert and oriented x 3.  Psych: Normal affect. Extremities: No clubbing or cyanosis.  HEENT: Normal.    Telemetry  ?NSR 100s  with PACs (personally reviewed)  Labs    CBC Recent Labs    10/05/20 0504 10/06/20 0431  WBC 12.3* 12.1*  HGB 11.0* 10.3*  HCT 35.8* 33.2*  MCV 81.9 81.4  PLT 188 734   Basic Metabolic Panel Recent Labs    10/03/20 2200 10/04/20 0444 10/04/20 2006 10/05/20 0504 10/06/20 0431  NA 133*   < > 137 137 136  K 3.6   < > 3.7 3.8 3.3*  CL 91*   < > 95* 96* 96*  CO2 29   < > 27 28 30   GLUCOSE 296*   < > 215* 230* 182*  BUN 54*   < > 48* 48* 45*  CREATININE 2.48*   < > 2.47* 2.51* 2.47*  CALCIUM 9.2   < > 9.1 9.1 8.8*  MG 2.5*  --  2.7*  --   --    < > = values in this interval not displayed.   Liver Function Tests No results for input(s): AST, ALT, ALKPHOS, BILITOT, PROT, ALBUMIN in the last 72 hours. No results for input(s): LIPASE, AMYLASE in the last 72 hours. Cardiac Enzymes No results for input(s): CKTOTAL, CKMB, CKMBINDEX, TROPONINI in the last 72 hours.  BNP: BNP (last 3 results) No results for input(s): BNP in the last 8760 hours.  ProBNP (last 3 results) No results for input(s): PROBNP in  the last 8760 hours.   D-Dimer No results for input(s): DDIMER in the last 72 hours. Hemoglobin A1C No results for input(s): HGBA1C in the last 72 hours. Fasting Lipid Panel No results for input(s): CHOL, HDL, LDLCALC, TRIG, CHOLHDL, LDLDIRECT in the last 72 hours. Thyroid Function Tests No results for input(s): TSH, T4TOTAL, T3FREE, THYROIDAB in the last 72 hours.  Invalid input(s): FREET3  Other results:   Imaging    ECHOCARDIOGRAM LIMITED  Result Date: 10/05/2020    ECHOCARDIOGRAM LIMITED REPORT   Patient Name:   Marcus Johnson. Date of Exam: 10/05/2020 Medical Rec #:  [id number]                Height:       70.5 in Accession #:    [id number]               Weight:       192.5 lb Date of Birth:  05-Feb-1961                BSA:          2.064 m Patient Age:    44 years                 BP:           97/77 mmHg Patient Gender: M                        HR:            102 bpm. Exam Location:  Inpatient Procedure: Limited Echo and Color Doppler Indications:     I50.40* Unspecified combined systolic (congestive) and                  diastolic (congestive) heart failure  History:         Patient has prior history of Echocardiogram examinations, most                  recent 09/29/2020. CHF and Cardiomyopathy, Previous Myocardial                  Infarction, Signs/Symptoms:Dyspnea; Risk Factors:Diabetes and                  Dyslipidemia. Edema. Impella device present.  Sonographer:     Roseanna Rainbow RDCS Referring Phys:  [place] Diagnosing Phys: Loralie Champagne MD  Sonographer Comments: Impella placement. IMPRESSIONS  1. Impella catheter in LV, measures around 4.8 cm. Left ventricular ejection fraction, by estimation, is 20 to 25%. The left ventricle has severely decreased function. The left ventricle demonstrates global hypokinesis. The left ventricular internal cavity size was mildly dilated. There is mild left ventricular hypertrophy.  2. Right ventricular systolic function is mildly reduced. The right ventricular size is normal.  3. Limited echo. FINDINGS  Left Ventricle: Impella catheter in LV, measures around 4.8 cm. Left ventricular ejection fraction, by estimation, is 20 to 25%. The left ventricle has severely decreased function. The left ventricle demonstrates global hypokinesis. The left ventricular  internal cavity size was mildly dilated. There is mild left ventricular hypertrophy. Right Ventricle: The right ventricular size is normal. Right ventricular systolic function is mildly reduced. Loralie Champagne MD Electronically signed by Loralie Champagne MD Signature Date/Time: 10/05/2020/3:06:10 PM    Final      Medications:     Scheduled Medications: . sodium chloride   Intravenous Once  . amiodarone  200 mg Oral BID  .  bisacodyl  10 mg Rectal Once  . Chlorhexidine Gluconate Cloth  6 each Topical Daily  . furosemide  60 mg Intravenous BID  . gabapentin  300 mg  Oral TID  . insulin aspart  0-20 Units Subcutaneous TID WC  . insulin aspart  0-5 Units Subcutaneous QHS  . insulin glargine  10 Units Subcutaneous QHS  . mouth rinse  15 mL Mouth Rinse BID  . multivitamin with minerals  1 tablet Oral Daily  . potassium chloride  40 mEq Oral Q4H  . rosuvastatin  40 mg Oral Daily  . sildenafil  40 mg Oral TID  . sodium chloride flush  10-40 mL Intracatheter Q12H    Infusions: . sodium chloride Stopped (09/28/20 1746)  . DOBUTamine 2 mcg/kg/min (10/06/20 0700)  . heparin 700 Units/hr (10/06/20 0630)  . impella catheter heparin 50 unit/mL in dextrose 5% 6.1 mL/hr at 10/05/20 0851  . lactated ringers 20 mL/hr at 10/06/20 0700  . norepinephrine (LEVOPHED) Adult infusion Stopped (10/04/20 1745)    PRN Medications: sodium chloride, acetaminophen, ALPRAZolam, HYDROmorphone (DILAUDID) injection, ondansetron (ZOFRAN) IV, ondansetron, oxyCODONE, oxyCODONE-acetaminophen, sodium chloride flush, white petrolatum    Assessment/Plan   1. COVID-19 infection:in 1/22. Manifested primarily as nausea/vomiting. Very slow recovery, did not take meds for at least 3 wks but had recently started back on meds just prior to admission.   - Suspect that nausea/poor appetite was actually due to HF.   2.Acute on chronic systolic HF:ICM.HasMedtronic ICD. Narrow QRS, not CRT candidate. Has required milrinone multiple times in the past for low output HF. Echo 6/19 with EF 20% and moderately decreased RV systolic function.CPX 4/19 with severe functional limitation due to [facility] 6/19 showed low output and coronary angiography showed no interventional targets.Echo in 4/21 showed EF 25%, mildly decreased RV systolic function. CPX in 5/21 showed severe HF limitation. He has been resistant to LVAD/transplant evaluation, both offered with discussions in past. I worry that we will miss the window for advanced therapies. He was not interested in barostimulatoractivation  therapy. History of cardiorenal syndrome/diabetic nephropathy, creatinine up to 2.33 in 1/22, likely due to dehydration with nausea/vomiting.He had been off meds for several weeks, just restarted 3-4 days PTA. Low output persisted despite milrinone and then dobutamine with rise in creatinine to 2.5. Impella 5.5 placed 3/1.  This morning, CVP 14 with CI 2.7. PA pressure lower, systolic 62B on sildenafil.  - VAD workup has been initiated, renal function is limiting factor currently.  Will need teeth out Friday with Dr. Benson Norway.  - Continue Impella P8, good position by echo yesterday.  Continue heparin gtt. Stable LDH and plts.   - Lasix 60 mg IV bid today.  - Continue dobutamine 2.  - Increase sildenafil to 40 mg tid with pulmonary hypertension.  - Off carvedilol with low output - Will hold spiro with ongoing AKI - Off empagliflozin for now with AKI  - Off Entresto with low BP  2. CAD s/p [id number]:Last cath in 6/19 with patent grafts. No s/s ischemia  -No ASA given apixaban use.  - Continue Crestor 40 mg daily.  3. AKI on CKD Stage 3b: I suspect that this is a combination of cardiorenal syndrome and diabetic nephropathy and possibly ATN. Creatinine stable 1.8 -> 2.1 -> 2.25 -> 2.5  -> 2.4 -> 2.5 -> 2.47. Need creatinine to trend down for LVAD.  - continue hemodynamic support  4. WL:SLHTDSK of very poor control but hgbA1ctrending down. Most recent 7.7%  - Cover with SSI.  5. Atrial flutter: s/p DC-CV 08/21/18.He is in NSR currently but note runs of ?flutter vs fibrillation on device interrogation. Very frequent PACs on telemetry -> confirmed on ECG 2/25. ?rhythm today.  - Needs ECG.  - Continue amiodarone 200 mg bid.   - Heparin gtt with Impella.  6. Wound R Foot: Partial thickness skin loss noted. Cover with mepilex border and change every 5 days.  7. Right arm pain: Think neuropathic, per Dr. Cyndia Bent patient had stretch of brachial plexus with Impella placement.  - Add gabapentin.    CRITICAL CARE Performed by: Loralie Champagne  Total critical care time: 40 minutes  Critical care time was exclusive of separately billable procedures and treating other patients.  Critical care was necessary to treat or prevent imminent or life-threatening deterioration.  Critical care was time spent personally by me on the following activities: development of treatment plan with patient and/or surrogate as well as nursing, discussions with consultants, evaluation of patient's response to treatment, examination of patient, obtaining history from patient or surrogate, ordering and performing treatments and interventions, ordering and review of laboratory studies, ordering and review of radiographic studies, pulse oximetry and re-evaluation of patient's condition.   Length of Stay: 8  Loralie Champagne, MD  10/06/2020, 7:38 AM  Advanced Heart Failure Team Pager [phone] (M-F; 7a - 4p)  Please contact [facility] for night-coverage after hours (4p -7a ) and weekends on [email or web address]

## 2020-10-06 NOTE — Plan of Care (Signed)
  Problem: Clinical Measurements: Goal: Diagnostic test results will improve Outcome: Progressing Goal: Cardiovascular complication will be avoided Outcome: Progressing   Problem: Activity: Goal: Risk for activity intolerance will decrease Outcome: Progressing   Problem: Nutrition: Goal: Adequate nutrition will be maintained Outcome: Progressing   Problem: Coping: Goal: Level of anxiety will decrease Outcome: Progressing   Problem: Pain Managment: Goal: General experience of comfort will improve Outcome: Progressing   Problem: Activity: Goal: Capacity to carry out activities will improve Outcome: Progressing   Problem: Cardiac: Goal: Ability to achieve and maintain adequate cardiopulmonary perfusion will improve Outcome: Progressing

## 2020-10-06 NOTE — Progress Notes (Signed)
RN attempted to receive consent regarding dental procedure scheduled for 10/07/2020. Pt expressed concerns regarding not feeling comfortable signing the consent because he feels as he is unsure what he is having done. RN tried reaching out several times to dental services on-call, however was unsuccessful. At this time consent remains unsigned, and RN will continue reaching out for further education for pt understanding.

## 2020-10-06 NOTE — Progress Notes (Signed)
Nutrition Follow Up  DOCUMENTATION CODES:   Not applicable  INTERVENTION:    Ensure Enlive po BID, each supplement provides 350 kcal and 20 grams of protein  ProSource Plus 30 ml BID, each supplement provides 100 kcals and 15 grams protein.   MVI daily  NUTRITION DIAGNOSIS:   Increased nutrient needs related to chronic illness (CHF) as evidenced by estimated needs.  Ongoing  GOAL:   Patient will meet greater than or equal to 90% of their needs   Progressing   MONITOR:   PO intake,Supplement acceptance,Labs,Weight trends,Skin,I & O's  REASON FOR ASSESSMENT:   Consult LVAD Eval  ASSESSMENT:   Marcus Johnson. is a 60 y.o. male who has a h/o DM2, CAD s/p CABG 3875, systolic HF due to ischemic cardiomyopathy with EF 20-25% (echo 12/15), DM2 and CKD. He is s/p Medtronic Johnson chamber ICD.Pt admitted with CHF.  3/1- s/p impella   Pt discussed during ICU rounds and with RN.   Awaiting renal improvement prior to LVAD placement. Plan multiple tooth extraction Friday and possible LVAD implantation early next week.   Patients appetite off/on. Last four meal completions charted as 50%, 75%, 35%, and 25%. Discussed ongoing need for protein and kcal prior to surgery and after. Patient willing to try Ensure.   Monitor for need of diet texture adjustment s/p tooth extraction.   Admission weight: 97.5 kg  Current weight: 81.6 kg   I/O: 1920 ml x 24 hrs   Medications: dulcolax, 60 mg lasix BID, SS novolog, lantus Labs: K 3.3 (L) Cr 2.47- slightly down from yesterday CBG 68-249  Diet Order:   Diet Order            Diet 2 gram sodium Room service appropriate? Yes; Fluid consistency: Thin  Diet effective now                 EDUCATION NEEDS:   Education needs have been addressed  Skin:  Skin Assessment: Skin Integrity Issues: Skin Integrity Issues:: Incisions Incisions: chest  Last BM:  3/2  Height:   Ht Readings from Last 1 Encounters:  10/03/20 5'  10.5" (1.791 m)    Weight:   Wt Readings from Last 1 Encounters:  10/06/20 81.6 kg    Ideal Body Weight:  76.8 kg  BMI:  Body mass index is 25.45 kg/m.  Estimated Nutritional Needs:   Kcal:  2100-2300  Protein:  100-115 grams  Fluid:  2 L  Marcus Johnson RD, LDN Clinical Nutrition Pager listed in [place]

## 2020-10-06 NOTE — Progress Notes (Signed)
Patient up in chair at bedside. He reports he is having intermittent pain/numbness in arm affected by Impella. He says he is getting pain medication that is taking care of it.  He reports there is a "lot going on" and he is having some difficulty with pain and busy schedule in hospital. He reports he walked with PT. Encouraged him to stay as active as possible in case LVAD surgery is in his future. He is aware that his kidney function will have to improve in order to become an LVAD candidate.  He met with LVAD patient earlier this week and says he really enjoyed meeting an speaking with him. Says it was very encouraging. No further questions for VAD Coordinator at this time.  Zada Girt RN, VAD Coordinator 24/7 VAD pager: [phone]

## 2020-10-06 NOTE — Progress Notes (Addendum)
[place] for heparin Indication: atrial fibrillation  Allergies  Allergen Reactions  . Meclizine Hcl Anaphylaxis and Swelling  . Ivabradine Nausea Only    Patient Measurements: Height: 5' 10.5" (179.1 cm) Weight: 81.6 kg (179 lb 14.3 oz) IBW/kg (Calculated) : 74.15 Heparin Dosing Weight: 88.5 kg   Vital Signs: Temp: 98.8 F (37.1 C) (03/03 0600) Temp Source: Core (03/03 0600) BP: 95/68 (03/03 0600) Pulse Rate: 93 (03/03 0600)  Labs: Recent Labs    10/04/20 0444 10/04/20 1632 10/04/20 1823 10/04/20 2006 10/05/20 0504 10/05/20 1433 10/05/20 2126 10/06/20 0431  HGB 11.4* 12.9* 11.2*  --  11.0*  --   --  10.3*  HCT 37.0* 38.0* 37.8*  --  35.8*  --   --  33.2*  PLT 180  --  173  --  188  --   --  188  APTT 65*  --   --   --  32 34 38* 39*  HEPARINUNFRC 2.08*  --   --   --  1.08*  --   --  1.07*  CREATININE 2.40* 2.30*  --  2.47* 2.51*  --   --   --     Estimated Creatinine Clearance: 33.3 mL/min (A) (by C-G formula based on SCr of 2.51 mg/dL (H)).   Assessment: 30 yom presenting with volume overload with low output HF - plan for impella placement while undergoing LVAD w/u. On apixaban PTA for hx Afib - LD 2/27@2144 .  Patient s/p Impella placement on 3/1 and started on heparin purge (50 units/mL).  After discussion with Dr. Cyndia Bent and Dr. Aundra Dubin 3/2 AM, decision made to start lower-dose systemic heparin and titrate up slowly. Heparin level falsely elevated (affected by apixaban).  APTT remains subtherapeutic (41 sec) on systemic heparin gtt at 700 units/hr and impella heparin purge at 6.3 ml/hr (315units/hr). No issues with line or bleeding reported per RN. Plans noted for dental extractions tomorrow morning.  Goal of Therapy:  APTT 56-85 sec Heparin level 0.2-0.5 units/ml Monitor platelets by anticoagulation protocol: Yes   Plan:  Increase systemic IV heparin to 800 units/hr Continue Impella heparin purge 50 units/ml   Monitor aPTT/HL Q6 hrs for the first 24 hrs, then Q12 hrs until therapeutic Monitor daily CBC, s/sx bleeding F/u [facility] plans post-dental extractions  [name], PharmD, BCPS PGY2 Cardiology Pharmacy Resident Phone: [phone] 10/06/2020  2:34 PM  Please check [email or web address] for unit-specific pharmacy phone numbers.

## 2020-10-06 NOTE — Progress Notes (Signed)
Spoke to on call cardiology fellow Dr. Boyd Kerbs at this time.  Pt refuses to sign consent for dental surgery stating "no doctor has explained this to me. I am not having surgery until they talk to me about this."  Doctor states that both consent and heparin drip for surgery will need to be addressed in am by attending physician.  Order for albumin to address Impella suction alarms and hypotension. See new orders.

## 2020-10-06 NOTE — Anesthesia Preprocedure Evaluation (Addendum)
Anesthesia Evaluation  Patient identified by MRN, date of birth, ID band Patient awake    Reviewed: Allergy & Precautions, NPO status , Patient's Chart, lab work & pertinent test results  History of Anesthesia Complications Negative for: history of anesthetic complications  Airway Mallampati: II  TM Distance: >3 FB Neck ROM: Full    Dental  (+) Poor Dentition   Pulmonary neg pulmonary ROS,    Pulmonary exam normal        Cardiovascular hypertension, + CAD, + Past MI, + CABG (2002) and +CHF  Normal cardiovascular exam+ dysrhythmias Atrial Fibrillation + Cardiac Defibrillator   Ischemic cardiomyopathy s/p Impella placement 10/04/20   Neuro/Psych negative neurological ROS  negative psych ROS   GI/Hepatic negative GI ROS, Neg liver ROS,   Endo/Other  diabetes, Type 2  Renal/GU Renal InsufficiencyRenal disease  negative genitourinary   Musculoskeletal negative musculoskeletal ROS (+)   Abdominal   Peds  Hematology negative hematology ROS (+)   Anesthesia Other Findings Day of surgery medications reviewed with patient.  Reproductive/Obstetrics negative OB ROS                            Anesthesia Physical Anesthesia Plan  ASA: IV  Anesthesia Plan: General   Post-op Pain Management:    Induction: Intravenous  PONV Risk Score and Plan: 2 and Treatment may vary due to age or medical condition, Midazolam and Ondansetron  Airway Management Planned: Oral ETT and Video Laryngoscope Planned  Additional Equipment: Arterial line, CVP and PA Cath  Intra-op Plan:   Post-operative Plan: Extubation in OR  Informed Consent: I have reviewed the patients History and Physical, chart, labs and discussed the procedure including the risks, benefits and alternatives for the proposed anesthesia with the patient or authorized representative who has indicated his/her understanding and acceptance.      Dental advisory given  Plan Discussed with: CRNA  Anesthesia Plan Comments:        Anesthesia Quick Evaluation

## 2020-10-07 ENCOUNTER — Inpatient Hospital Stay (HOSPITAL_COMMUNITY): Payer: Medicare HMO

## 2020-10-07 ENCOUNTER — Inpatient Hospital Stay (HOSPITAL_COMMUNITY): Payer: Medicare HMO | Admitting: Anesthesiology

## 2020-10-07 ENCOUNTER — Encounter (HOSPITAL_COMMUNITY)
Admission: AD | Disposition: A | Discharge status: Rehabilitation Facility | Payer: Self-pay | Source: Home / Self Care | Attending: Surgery

## 2020-10-07 DIAGNOSIS — I5023 Acute on chronic systolic (congestive) heart failure: Secondary | ICD-10-CM | POA: Diagnosis not present

## 2020-10-07 DIAGNOSIS — K029 Dental caries, unspecified: Secondary | ICD-10-CM | POA: Diagnosis not present

## 2020-10-07 DIAGNOSIS — K08199 Complete loss of teeth due to other specified cause, unspecified class: Secondary | ICD-10-CM | POA: Diagnosis not present

## 2020-10-07 DIAGNOSIS — Z95811 Presence of heart assist device: Secondary | ICD-10-CM | POA: Diagnosis not present

## 2020-10-07 HISTORY — PX: MULTIPLE EXTRACTIONS WITH ALVEOLOPLASTY: SHX5342

## 2020-10-07 LAB — CBC
HCT: 35.3 % — ABNORMAL LOW (ref 39.0–52.0)
Hemoglobin: 10.3 g/dL — ABNORMAL LOW (ref 13.0–17.0)
MCH: 24.4 pg — ABNORMAL LOW (ref 26.0–34.0)
MCHC: 29.2 g/dL — ABNORMAL LOW (ref 30.0–36.0)
MCV: 83.6 fL (ref 80.0–100.0)
Platelets: 159 10*3/uL (ref 150–400)
RBC: 4.22 MIL/uL (ref 4.22–5.81)
RDW: 19.6 % — ABNORMAL HIGH (ref 11.5–15.5)
WBC: 13.2 10*3/uL — ABNORMAL HIGH (ref 4.0–10.5)
nRBC: 0 % (ref 0.0–0.2)

## 2020-10-07 LAB — LACTATE DEHYDROGENASE: LDH: 395 U/L — ABNORMAL HIGH (ref 98–192)

## 2020-10-07 LAB — GLUCOSE, CAPILLARY
Glucose-Capillary: 249 mg/dL — ABNORMAL HIGH (ref 70–99)
Glucose-Capillary: 158 mg/dL — ABNORMAL HIGH (ref 70–99)
Glucose-Capillary: 160 mg/dL — ABNORMAL HIGH (ref 70–99)
Glucose-Capillary: 173 mg/dL — ABNORMAL HIGH (ref 70–99)
Glucose-Capillary: 171 mg/dL — ABNORMAL HIGH (ref 70–99)
Glucose-Capillary: 201 mg/dL — ABNORMAL HIGH (ref 70–99)

## 2020-10-07 LAB — POCT I-STAT 7, (LYTES, BLD GAS, ICA,H+H)
Acid-Base Excess: 5 mmol/L — ABNORMAL HIGH (ref 0.0–2.0)
Bicarbonate: 29.4 mmol/L — ABNORMAL HIGH (ref 20.0–28.0)
Calcium, Ion: 1.2 mmol/L (ref 1.15–1.40)
HCT: 32 % — ABNORMAL LOW (ref 39.0–52.0)
Hemoglobin: 10.9 g/dL — ABNORMAL LOW (ref 13.0–17.0)
O2 Saturation: 99 %
Potassium: 4 mmol/L (ref 3.5–5.1)
Sodium: 140 mmol/L (ref 135–145)
TCO2: 31 mmol/L (ref 22–32)
pCO2 arterial: 41.4 mmHg (ref 32.0–48.0)
pH, Arterial: 7.46 — ABNORMAL HIGH (ref 7.350–7.450)
pO2, Arterial: 154 mmHg — ABNORMAL HIGH (ref 83.0–108.0)

## 2020-10-07 LAB — TYPE AND SCREEN
ABO/RH(D): A NEG
Antibody Screen: NEGATIVE

## 2020-10-07 LAB — COOXEMETRY PANEL
Carboxyhemoglobin: 2.2 % — ABNORMAL HIGH (ref 0.5–1.5)
Carboxyhemoglobin: 2.4 % — ABNORMAL HIGH (ref 0.5–1.5)
Methemoglobin: 0.8 % (ref 0.0–1.5)
Methemoglobin: 0.9 % (ref 0.0–1.5)
O2 Saturation: 51.8 %
O2 Saturation: 62.1 %
Total hemoglobin: 10.2 g/dL — ABNORMAL LOW (ref 12.0–16.0)
Total hemoglobin: 9.3 g/dL — ABNORMAL LOW (ref 12.0–16.0)

## 2020-10-07 LAB — HEPARIN LEVEL (UNFRACTIONATED): Heparin Unfractionated: 0.68 IU/mL (ref 0.30–0.70)

## 2020-10-07 LAB — BASIC METABOLIC PANEL
Anion gap: 8 (ref 5–15)
BUN: 46 mg/dL — ABNORMAL HIGH (ref 6–20)
CO2: 29 mmol/L (ref 22–32)
Calcium: 8.8 mg/dL — ABNORMAL LOW (ref 8.9–10.3)
Chloride: 99 mmol/L (ref 98–111)
Creatinine, Ser: 2.45 mg/dL — ABNORMAL HIGH (ref 0.61–1.24)
GFR, Estimated: 30 mL/min — ABNORMAL LOW (ref >60)
Glucose, Bld: 162 mg/dL — ABNORMAL HIGH (ref 70–99)
Potassium: 4.6 mmol/L (ref 3.5–5.1)
Sodium: 136 mmol/L (ref 135–145)

## 2020-10-07 LAB — APTT: aPTT: 57 seconds — ABNORMAL HIGH (ref 24–36)

## 2020-10-07 SURGERY — MULTIPLE EXTRACTION WITH ALVEOLOPLASTY
Anesthesia: General | Site: Mouth

## 2020-10-07 MED ORDER — FENTANYL CITRATE (PF) 250 MCG/5ML IJ SOLN
INTRAMUSCULAR | Status: AC
  Filled 2020-10-07: qty 5

## 2020-10-07 MED ORDER — DEXAMETHASONE SODIUM PHOSPHATE 4 MG/ML IJ SOLN
INTRAMUSCULAR | Status: DC | PRN
  Administered 2020-10-07: 4 mg via INTRAVENOUS

## 2020-10-07 MED ORDER — HEPARIN (PORCINE) 25000 UT/250ML-% IV SOLN
1000.0000 [IU]/h | INTRAVENOUS | Status: DC
  Administered 2020-10-08: 850 [IU]/h via INTRAVENOUS
  Administered 2020-10-11: 900 [IU]/h via INTRAVENOUS
  Administered 2020-10-13: 1000 [IU]/h via INTRAVENOUS
  Filled 2020-10-07 (×7): qty 250

## 2020-10-07 MED ORDER — MIDAZOLAM HCL 5 MG/5ML IJ SOLN
INTRAMUSCULAR | Status: DC | PRN
  Administered 2020-10-07: 2 mg via INTRAVENOUS

## 2020-10-07 MED ORDER — 0.9 % SODIUM CHLORIDE (POUR BTL) OPTIME
TOPICAL | Status: DC | PRN
  Administered 2020-10-07: 1000 mL

## 2020-10-07 MED ORDER — LIDOCAINE 5 % EX PTCH
1.0000 | MEDICATED_PATCH | CUTANEOUS | Status: DC
  Administered 2020-10-07 – 2020-10-30 (×21): 1 via TRANSDERMAL
  Filled 2020-10-07 (×28): qty 1

## 2020-10-07 MED ORDER — LIDOCAINE HCL (CARDIAC) PF 100 MG/5ML IV SOSY
PREFILLED_SYRINGE | INTRAVENOUS | Status: DC | PRN
  Administered 2020-10-07: 80 mg via INTRAVENOUS

## 2020-10-07 MED ORDER — SUGAMMADEX SODIUM 200 MG/2ML IV SOLN
INTRAVENOUS | Status: DC | PRN
  Administered 2020-10-07: 200 mg via INTRAVENOUS

## 2020-10-07 MED ORDER — PROPOFOL 10 MG/ML IV BOLUS
INTRAVENOUS | Status: DC | PRN
  Administered 2020-10-07: 50 mg via INTRAVENOUS

## 2020-10-07 MED ORDER — ONDANSETRON HCL 4 MG/2ML IJ SOLN
INTRAMUSCULAR | Status: DC | PRN
  Administered 2020-10-07: 4 mg via INTRAVENOUS

## 2020-10-07 MED ORDER — MIDAZOLAM HCL 2 MG/2ML IJ SOLN
INTRAMUSCULAR | Status: AC
  Filled 2020-10-07: qty 2

## 2020-10-07 MED ORDER — AMINOCAPROIC ACID SOLUTION 5% (50 MG/ML)
5.0000 mL | ORAL | Status: DC
  Filled 2020-10-07: qty 100

## 2020-10-07 MED ORDER — LIDOCAINE-EPINEPHRINE 2 %-1:100000 IJ SOLN
INTRAMUSCULAR | Status: AC
  Filled 2020-10-07: qty 10.2

## 2020-10-07 MED ORDER — ETOMIDATE 2 MG/ML IV SOLN
INTRAVENOUS | Status: DC | PRN
  Administered 2020-10-07: 16 mg via INTRAVENOUS

## 2020-10-07 MED ORDER — PROPOFOL 10 MG/ML IV BOLUS
INTRAVENOUS | Status: AC
  Filled 2020-10-07: qty 20

## 2020-10-07 MED ORDER — LACTATED RINGERS IV SOLN: INTRAVENOUS | Status: DC

## 2020-10-07 MED ORDER — AMINOCAPROIC ACID SOLUTION 5% (50 MG/ML)
5.0000 mL | ORAL | Status: DC | PRN
  Administered 2020-10-07: 5 mL via ORAL
  Administered 2020-10-07 – 2020-10-08 (×3): 10 mL via ORAL
  Administered 2020-10-08: 5 mL via ORAL
  Administered 2020-10-08: 10 mL via ORAL
  Administered 2020-10-08: 5 mL via ORAL
  Administered 2020-10-08 (×4): 10 mL via ORAL
  Filled 2020-10-07 (×4): qty 100

## 2020-10-07 MED ORDER — ROCURONIUM BROMIDE 100 MG/10ML IV SOLN
INTRAVENOUS | Status: DC | PRN
  Administered 2020-10-07: 60 mg via INTRAVENOUS

## 2020-10-07 MED ORDER — LIDOCAINE-EPINEPHRINE 2 %-1:100000 IJ SOLN
INTRAMUSCULAR | Status: DC | PRN
  Administered 2020-10-07: 3.4 mL via INTRADERMAL

## 2020-10-07 MED ORDER — THROMBIN 5000 UNITS EX KIT
PACK | CUTANEOUS | Status: AC
  Filled 2020-10-07: qty 1

## 2020-10-07 MED ORDER — BUPIVACAINE-EPINEPHRINE (PF) 0.5% -1:200000 IJ SOLN
INTRAMUSCULAR | Status: AC
  Filled 2020-10-07: qty 3.6

## 2020-10-07 MED ORDER — ALBUMIN HUMAN 5 % IV SOLN: INTRAVENOUS | Status: DC | PRN

## 2020-10-07 MED ORDER — FENTANYL CITRATE (PF) 100 MCG/2ML IJ SOLN
INTRAMUSCULAR | Status: DC | PRN
  Administered 2020-10-07: 100 ug via INTRAVENOUS

## 2020-10-07 SURGICAL SUPPLY — 34 items
ALCOHOL 70% 16 OZ (MISCELLANEOUS) ×2 IMPLANT
BLADE SURG 15 STRL LF DISP TIS (BLADE) ×1 IMPLANT
BLADE SURG 15 STRL SS (BLADE) ×2
COVER SURGICAL LIGHT HANDLE (MISCELLANEOUS) ×3 IMPLANT
COVER WAND RF STERILE (DRAPES) ×2 IMPLANT
FILTER STRAW FLUID ASPIR (MISCELLANEOUS) ×1 IMPLANT
GAUZE 4X4 16PLY RFD (DISPOSABLE) ×2 IMPLANT
GAUZE PACKING FOLDED 2  STR (GAUZE/BANDAGES/DRESSINGS) ×2
GAUZE PACKING FOLDED 2 STR (GAUZE/BANDAGES/DRESSINGS) ×1 IMPLANT
GAUZE SPONGE 4X4 12PLY STRL LF (GAUZE/BANDAGES/DRESSINGS) ×1 IMPLANT
GLOVE BIO SURGEON STRL SZ 6.5 (GLOVE) ×2 IMPLANT
GLOVE SURG SS PI 6.0 STRL IVOR (GLOVE) ×2 IMPLANT
GOWN STRL REUS W/ TWL LRG LVL3 (GOWN DISPOSABLE) ×2 IMPLANT
GOWN STRL REUS W/TWL LRG LVL3 (GOWN DISPOSABLE) ×4
KIT BASIN OR (CUSTOM PROCEDURE TRAY) ×2 IMPLANT
KIT TURNOVER KIT B (KITS) ×2 IMPLANT
MANIFOLD NEPTUNE II (INSTRUMENTS) ×2 IMPLANT
NS IRRIG 1000ML POUR BTL (IV SOLUTION) ×2 IMPLANT
PACK EENT II TURBAN DRAPE (CUSTOM PROCEDURE TRAY) ×2 IMPLANT
PAD ARMBOARD 7.5X6 YLW CONV (MISCELLANEOUS) ×2 IMPLANT
SPONGE SURGIFOAM ABS GEL 100 (HEMOSTASIS) IMPLANT
SPONGE SURGIFOAM ABS GEL 12-7 (HEMOSTASIS) IMPLANT
SPONGE SURGIFOAM ABS GEL SZ50 (HEMOSTASIS) ×1 IMPLANT
SUCTION FRAZIER HANDLE 10FR (MISCELLANEOUS) ×2
SUCTION TUBE FRAZIER 10FR DISP (MISCELLANEOUS) ×1 IMPLANT
SUT CHROMIC 3 0 PS 2 (SUTURE) ×5 IMPLANT
SUT CHROMIC 4 0 P 3 18 (SUTURE) IMPLANT
SYR 50ML SLIP (SYRINGE) ×2 IMPLANT
SYR BULB IRRIG 60ML STRL (SYRINGE) ×3 IMPLANT
TOWEL GREEN STERILE FF (TOWEL DISPOSABLE) ×2 IMPLANT
TUBE CONNECTING 12X1/4 (SUCTIONS) ×2 IMPLANT
WATER STERILE IRR 1000ML POUR (IV SOLUTION) ×2 IMPLANT
WATER TABLETS ICX (MISCELLANEOUS) ×2 IMPLANT
YANKAUER SUCT BULB TIP NO VENT (SUCTIONS) ×3 IMPLANT

## 2020-10-07 NOTE — Progress Notes (Signed)
CSW visited bedside although patient was sleeping. CSW did not disturb and will follow up with patient on Monday. Raquel Sarna, [place], [place]

## 2020-10-07 NOTE — Progress Notes (Signed)
PREOPERATIVE NOTE  10/07/2020 Marcus Johnson. [id number]  VITALS: BP (!) 133/93   Pulse 92   Temp (!) 97.34 F (36.3 C)   Resp 20   Ht 5' 10.5" (1.791 m)   Wt 83 kg   SpO2 100%   BMI 25.88 kg/m   Lab Results  Component Value Date   WBC 13.2 (H) 10/07/2020   HGB 10.3 (L) 10/07/2020   HCT 35.3 (L) 10/07/2020   MCV 83.6 10/07/2020   PLT 159 10/07/2020   BMET    Component Value Date/Time   NA 136 10/07/2020 0401   NA 140 06/23/2020 1027   K 4.6 10/07/2020 0401   CL 99 10/07/2020 0401   CO2 29 10/07/2020 0401   GLUCOSE 162 (H) 10/07/2020 0401   BUN 46 (H) 10/07/2020 0401   BUN 46 (H) 06/23/2020 1027   CREATININE 2.45 (H) 10/07/2020 0401   CREATININE 1.25 12/19/2015 0749   CALCIUM 8.8 (L) 10/07/2020 0401   GFRNONAA 30 (L) 10/07/2020 0401   GFRAA 40 (L) 06/23/2020 1027    Lab Results  Component Value Date   INR 1.30 01/09/2018   INR 1.16 04/15/2016   INR 1.37 10/21/2015   No results found for: PTT   Marcus Johnson. presents for dental procedures in the operating room.   SUBJECTIVE: The patient denies any acute medical or dental changes and agrees to proceed with treatment as planned.   EXAM: No sign of acute dental changes.   ASSESSMENT: Patient is affected by severe dental caries and periodontal disease.   PLAN: Patient agrees to proceed with treatment as planned in the operating room as previously discussed (Extractions of retained root tips #2, #12, #14 and #32 and grossly carious tooth #1) and accepts the risks, benefits, and complications of the proposed treatment. Patient is aware of the risk for bleeding, bruising, swelling, infection, pain, nerve damage, sinus involvement, root tip fracture, mandible fracture, and the risks of complications associated with the anesthesia. Patient also is aware of the potential for other complications not mentioned above.    Marcus Johnson, DMD

## 2020-10-07 NOTE — Transfer of Care (Signed)
Immediate Anesthesia Transfer of Care Note  Patient: Marcus Johnson.  Procedure(s) Performed: MULTIPLE EXTRACTION WITH ALVEOLOPLASTY (N/A Mouth)  Patient Location: ICU  Anesthesia Type:General  Level of Consciousness: awake, alert , oriented and patient cooperative  Airway & Oxygen Therapy: Patient Spontanous Breathing and Patient connected to face mask oxygen  Post-op Assessment: Report given to RN and Post -op Vital signs reviewed and stable  Post vital signs: Reviewed and stable  Last Vitals:  Vitals Value Taken Time  BP    Temp    Pulse 88 10/07/20 1725  Resp 13 10/07/20 1725  SpO2 100 % 10/07/20 1725  Vitals shown include unvalidated device data.  Last Pain:  Vitals:   10/07/20 1200  TempSrc: Core  PainSc: 2       Patients Stated Pain Goal: 2 (51/83/35 8251)  Complications: No complications documented..  Transported from OR 5 back to 2H17.  VSS throughout transport.  Report to RN at bedside.

## 2020-10-07 NOTE — Progress Notes (Signed)
Patient ID: Marcus Johnson., male   DOB: [birth year], 60 y.o.   MRN: [id number]     Advanced Heart Failure Rounding Note  PCP-Cardiologist: No primary care provider on file.   Subjective:    2/27 Milrinone switched to dobutamine 5 mcg.  2/28 Moved to ICU. Dobutamine increased to 7.5 mcg and Norepi 3 mcg added. Diuretics held.  3/1 Impella 5.5 placed.   Creatinine 2.45 today.    Main complaint today is still right arm neuropathic pain s/p Impella, pain meds and gabapentin helped overnight but bad again this morning.   He walked in hall with PT yesterday.   [place] numbers: CVP 7-8 PA 57/12 CI 2.3 Co-ox 51% (early am)  Impella 5.5: P8 Flow 4.2 No alarms LDH 384 => 395 Hgb 10.3 => 10.3 plts 188 => 159  Echo: EF 20-25%, normal RV, PASP 61, dilated IVC.    Objective:   Weight Range: 83 kg Body mass index is 25.88 kg/m.   Vital Signs:   Temp:  [97.7 F (36.5 C)-99.1 F (37.3 C)] 98.2 F (36.8 C) (03/04 0715) Pulse Rate:  [56-106] 92 (03/04 0715) Resp:  [6-33] 18 (03/04 0715) BP: (85-127)/(56-90) 109/90 (03/04 0700) SpO2:  [89 %-100 %] 100 % (03/04 0715) Arterial Line BP: (86-121)/(56-79) 120/76 (03/04 0715) Weight:  [83 kg] 83 kg (03/04 0645) Last BM Date: 10/05/20  Weight change: Filed Weights   10/05/20 0617 10/06/20 0500 10/07/20 0645  Weight: 87.3 kg 81.6 kg 83 kg    Intake/Output:   Intake/Output Summary (Last 24 hours) at 10/07/2020 0750 Last data filed at 10/07/2020 0700 Gross per 24 hour  Intake 1716.29 ml  Output 2162 ml  Net -445.71 ml      Physical Exam   CVP 7-8.  General: NAD Neck: No JVD, no thyromegaly or thyroid nodule.  Lungs: Clear to auscultation bilaterally with normal respiratory effort. CV: Nondisplaced PMI.  Heart regular S1/S2, no S3/S4, no murmur.  No peripheral edema.   Abdomen: Soft, nontender, no hepatosplenomegaly, no distention.  Skin: Intact without lesions or rashes.  Neurologic: Alert and oriented x 3.  Psych:  Normal affect. Extremities: No clubbing or cyanosis.  HEENT: Normal.    Telemetry  NSR 100s with PACs (personally reviewed)  Labs    CBC Recent Labs    10/06/20 0431 10/07/20 0401  WBC 12.1* 13.2*  HGB 10.3* 10.3*  HCT 33.2* 35.3*  MCV 81.4 83.6  PLT 188 517   Basic Metabolic Panel Recent Labs    10/04/20 2006 10/05/20 0504 10/06/20 1327 10/07/20 0401  NA 137   < > 134* 136  K 3.7   < > 3.7 4.6  CL 95*   < > 96* 99  CO2 27   < > 28 29  GLUCOSE 215*   < > 268* 162*  BUN 48*   < > 45* 46*  CREATININE 2.47*   < > 2.51* 2.45*  CALCIUM 9.1   < > 8.9 8.8*  MG 2.7*  --  2.7*  --    < > = values in this interval not displayed.   Liver Function Tests No results for input(s): AST, ALT, ALKPHOS, BILITOT, PROT, ALBUMIN in the last 72 hours. No results for input(s): LIPASE, AMYLASE in the last 72 hours. Cardiac Enzymes No results for input(s): CKTOTAL, CKMB, CKMBINDEX, TROPONINI in the last 72 hours.  BNP: BNP (last 3 results) No results for input(s): BNP in the last 8760 hours.  ProBNP (last 3 results) No  results for input(s): PROBNP in the last 8760 hours.   D-Dimer No results for input(s): DDIMER in the last 72 hours. Hemoglobin A1C No results for input(s): HGBA1C in the last 72 hours. Fasting Lipid Panel No results for input(s): CHOL, HDL, LDLCALC, TRIG, CHOLHDL, LDLDIRECT in the last 72 hours. Thyroid Function Tests No results for input(s): TSH, T4TOTAL, T3FREE, THYROIDAB in the last 72 hours.  Invalid input(s): FREET3  Other results:   Imaging    No results found.   Medications:     Scheduled Medications:  (feeding supplement) PROSource Plus  30 mL Oral BID BM   sodium chloride   Intravenous Once   amiodarone  200 mg Oral BID   bisacodyl  10 mg Rectal Once   Chlorhexidine Gluconate Cloth  6 each Topical Daily   feeding supplement  237 mL Oral BID BM   gabapentin  300 mg Oral TID   insulin aspart  0-20 Units Subcutaneous TID WC    insulin aspart  0-5 Units Subcutaneous QHS   insulin glargine  10 Units Subcutaneous QHS   mouth rinse  15 mL Mouth Rinse BID   multivitamin with minerals  1 tablet Oral Daily   rosuvastatin  40 mg Oral Daily   sildenafil  40 mg Oral TID   sodium chloride flush  10-40 mL Intracatheter Q12H    Infusions:  sodium chloride Stopped (09/28/20 1746)    ceFAZolin (ANCEF) IV     DOBUTamine 2 mcg/kg/min (10/07/20 0700)   heparin 850 Units/hr (10/07/20 0700)   impella catheter heparin 50 unit/mL in dextrose 5% 6.1 mL/hr at 10/05/20 0851   lactated ringers Stopped (10/07/20 0435)   norepinephrine (LEVOPHED) Adult infusion Stopped (10/04/20 1745)    PRN Medications: sodium chloride, acetaminophen, ALPRAZolam, HYDROmorphone (DILAUDID) injection, ondansetron (ZOFRAN) IV, ondansetron, oxyCODONE, oxyCODONE-acetaminophen, sodium chloride flush, white petrolatum    Assessment/Plan   1. COVID-19 infection:in 1/22. Manifested primarily as nausea/vomiting. Very slow recovery, did not take meds for at least 3 wks but had recently started back on meds just prior to admission.   - Suspect that nausea/poor appetite was actually due to HF.   2.Acute on chronic systolic HF:ICM.HasMedtronic ICD. Narrow QRS, not CRT candidate. Has required milrinone multiple times in the past for low output HF. Echo 6/19 with EF 20% and moderately decreased RV systolic function.CPX 4/19 with severe functional limitation due to [facility] 6/19 showed low output and coronary angiography showed no interventional targets.Echo in 4/21 showed EF 25%, mildly decreased RV systolic function. CPX in 5/21 showed severe HF limitation. He has been resistant to LVAD/transplant evaluation, both offered with discussions in past. I worry that we will miss the window for advanced therapies. He was not interested in barostimulatoractivation therapy. History of cardiorenal syndrome/diabetic nephropathy, creatinine up to 2.33  in 1/22, likely due to dehydration with nausea/vomiting.He had been off meds for several weeks, just restarted 3-4 days PTA. Low output persisted despite milrinone and then dobutamine with rise in creatinine to 2.5. Impella 5.5 placed 3/1.  This morning, CVP 7-8 with CI 2.3. PA pressure systolic 22Q on sildenafil.  - VAD workup has been initiated, renal function is limiting factor currently.  Will need teeth out today with Dr. Benson Norway.  - Continue Impella P8, good position by echo yesterday.  Continue heparin gtt. Stable LDH and plts.  Limited echo today.  - Hold Lasix today.  - Continue dobutamine 2.  - Repeat co-ox, was low in early am.  - Continue sildenafil 40 mg  tid with pulmonary hypertension.  - Off carvedilol with low output - Will hold spiro with ongoing AKI - Off empagliflozin for now with AKI  - Off Entresto with low BP  2. CAD s/p CABG2002:Last cath in 6/19 with patent grafts. No s/s ischemia  -No ASA given apixaban use.  - Continue Crestor 40 mg daily.  3. AKI on CKD Stage 3b: I suspect that this is a combination of cardiorenal syndrome and diabetic nephropathy and possibly ATN. Creatinine stable 1.8 -> 2.1 -> 2.25 -> 2.5  -> 2.4 -> 2.5 -> 2.47 -> 2.45. Need creatinine to trend down for LVAD.  - continue hemodynamic support, no diuretic today.  4. FM:BWGYKZL of very poor control but hgbA1ctrending down. Most recent 7.7%  - Cover with SSI. 5. Atrial flutter: s/p DC-CV 08/21/18.He is in NSR currently but note runs of ?flutter vs fibrillation on device interrogation. Very frequent PACs on telemetry -> confirmed on ECG 2/25.  - Continue amiodarone 200 mg bid.   - Heparin gtt with Impella.  6. Wound R Foot: Partial thickness skin loss noted. Cover with mepilex border and change every 5 days.  7. Right arm pain: Think neuropathic, per Dr. Cyndia Bent patient had stretch of brachial plexus with Impella placement.  - Added gabapentin.   CRITICAL CARE Performed by: Loralie Champagne  Total critical care time: 40 minutes  Critical care time was exclusive of separately billable procedures and treating other patients.  Critical care was necessary to treat or prevent imminent or life-threatening deterioration.  Critical care was time spent personally by me on the following activities: development of treatment plan with patient and/or surrogate as well as nursing, discussions with consultants, evaluation of patient's response to treatment, examination of patient, obtaining history from patient or surrogate, ordering and performing treatments and interventions, ordering and review of laboratory studies, ordering and review of radiographic studies, pulse oximetry and re-evaluation of patient's condition.   Length of Stay: 9  Loralie Champagne, MD  10/07/2020, 7:50 AM  Advanced Heart Failure Team Pager [phone] (M-F; 7a - 4p)  Please contact [facility] for night-coverage after hours (4p -7a ) and weekends on [email or web address]

## 2020-10-07 NOTE — Progress Notes (Signed)
PT Cancellation Note  Patient Details Name: Marcus Johnson. MRN: [id number] DOB: [birth year]   Cancelled Treatment:    Reason Eval/Treat Not Completed: (P) Patient at procedure or test/unavailable Pt off floor for teeth extraction. PT will follow back for treatment next week.  Jeran Hiltz B. Migdalia Dk PT, DPT Acute Rehabilitation Services Pager [phone] Office [phone]    [place] 10/07/2020, 3:41 PM

## 2020-10-07 NOTE — Interval H&P Note (Signed)
History and Physical Interval Note:  10/07/2020 2:04 PM  Marcus Johnson.  has presented today for surgery, with the diagnosis of dental carries.  The various methods of treatment have been discussed with the patient and family. After consideration of risks, benefits and other options for treatment, the patient has consented to  Procedure(s): MULTIPLE EXTRACTION WITH ALVEOLOPLASTY (N/A) as a surgical intervention.  The patient's history has been reviewed, patient examined, no change in status, stable for surgery.  I have reviewed the patient's chart and labs.  Questions were answered to the patient's satisfaction.     [name]

## 2020-10-07 NOTE — Op Note (Addendum)
Department of Dental Medicine    OPERATIVE REPORT  DATE OF SURGERY:  10/07/2020  PATIENT NAME:   Marcus Johnson. DATE OF BIRTH:   05/28/1961 MEDICAL RECORD NUMBER: [id number]  SURGEON:  Lakeva Hollon B. Benson Norway, D.M.D.  ASSISTANT:  Molli Posey, DAII  PREOPERATIVE DIAGNOSES:  Dental caries, periodontal disease Patient Active Problem List   Diagnosis Date Noted  . Acute on chronic systolic heart failure ([place]) 09/28/2020  . Osteomyelitis of metatarsal ([place]) 08/11/2018  . Systolic CHF ([place]) 09/32/6712  . Acute on chronic systolic (congestive) heart failure ([place]) 01/07/2018  . CKD (chronic kidney disease), stage III ([place]) 12/20/2017  . Chronic combined systolic and diastolic congestive heart failure ([employer]) 04/16/2016  . Generalized weakness 04/15/2016  . Acute renal failure superimposed on stage 3 chronic kidney disease ([place]) 04/15/2016  . Dehydration   . Non compliance with medical treatment   . Obesity (BMI 30-39.9) 07/21/2014  . Cardiomyopathy, ischemic 07/21/2014  . Coronary artery disease involving native coronary artery of native heart without angina pectoris   . Lower extremity edema 07/19/2014  . DM (diabetes mellitus) type II controlled with renal manifestation ([place]) 07/19/2014  . CAP (community acquired pneumonia) 10/21/2013  . Dyspnea 10/19/2013  . Automatic implantable cardioverter-defibrillator  Medtronic 01/08/2012  . PLANTAR FASCIITIS 10/08/2008  . CARBUNCLE AND FURUNCLE OF TRUNK 08/09/2008  . HLD (hyperlipidemia) 01/09/2008  . HTN (hypertension) 01/09/2008  . MYOCARDIAL INFARCTION, HX OF 01/09/2008  . Ischemic cardiomyopathy 01/09/2008    POSTOPERATIVE DIAGNOSES: Same  PROCEDURES PERFORMED: 1. Extractions of teeth numbers 2, 12, 14 and 32 and crown removal of tooth #17  ANESTHESIA: General anesthesia via endotracheal tube.  MEDICATIONS: 1. Ancef 2 g IV prior to invasive dental procedures. 2. Local anesthesia with a total utilization of 2 cartridges  of 34 mg of lidocaine with 0.018 mg of epinephrine/ea.  SPECIMENS: 4 teeth that were extracted and discarded. Lower left wisdom tooth coronal portion only removed.  DRAINS/CULTURES: None  COMPLICATIONS: #45 roots left d/t small artery being exposed in buccal bone during surgical extraction causing bleeding that was worsened by patient being on heparin drip and Eliquis and limited access d/t endotracheal tube. Infected portion of tooth, which was limited to the crown, was removed. Since the tooth had no periapical infection or apical radiolucency, the clinical decision was made to suture patient and stop bleeding at that time.  ESTIMATED BLOOD LOSS: 10 mL  INTRAVENOUS FLUIDS: 10 mL of Lactated ringers solution.  INDICATION: The patient was recently diagnosed with exacerbation of congestive heart failure.  A medically necessary dental consult was then requested to evaluate the patient for any dental/orofacial infection and their overall oral health.  The patient was examined and subsequently treatment planned for extractions of all infected teeth and teeth with deep or gross decay.  This treatment plan was made to decrease the perioperative and postoperative risks and complications associated with dental/orofacial infection from affecting the patient's systemic health.  OPERATIVE FINDINGS:  The patient was examined in operating room number 5.  The indicated teeth were identified and verified for extraction. The patient was noted be affected by severe dental decay, chronic periapical infection and chronic periodontitis.  DESCRIPTION OF PROCEDURE: The patient was identified in the holding area and brought to the main operating room number 5 by the anesthesia team. The patient was then placed in the supine position on the operating table.  General anesthesia was then induced per the anesthesia team. The patient was then prepped and draped in the  usual sterile fashion for dental medicine procedures.  A  timeout was performed. The patient was identified and procedures were verified. A throat pack was placed at this time. The oral cavity was then thoroughly examined with the findings noted above. The patient was then ready for the dental medicine procedure as follows:  ROUTINE EXTRACTIONS: Local anesthesia was administered sequentially with a total utilization of 2 cartridges each containing 34 mg of lidocaine with 0.018 mg of epinephrine. Location of anesthesia included Maxillary URQ/LRQ and Mandibular LLQ/LRQ, LLQ buccal nerve block.  The maxillary left and right quadrants were first approached. The teeth were then subluxated with a series of straight elevators. Tooth numbers 2, 12 and 14 were then removed with Ronguers without complications. The surgical sites were then irrigated with copious amounts of sterile saline. The tissues were approximated and trimmed appropriately. Surgi Foam was placed in the extraction sites.  The mandibular left and right quadrants were then approached. The teeth were subluxated with a series of straight elevators. Tooth number 32 was then removed utilizing a Ronguers.The tissues were approximated and trimmed appropriately. The surgical sites were then irrigated with copious amounts of sterile saline. Surgi Foam was placed in the extraction sites.  SURGICAL EXTRACTIONS: Routine forceps extraction was attempted on tooth number 17 as described above.  Tooth number 17 broke off to the gingival margin.  A 15 blade incision was then made from the distal of number 16 to the distal of 17.  A FTMP flap was then carefully reflected with the periosteal elevator. A surgical handpiece with copious sterile irrigation was used to remove mesial and buccal bone.   Small artery in buccal bone of #17 was exposed and bleeding stopped with gauze pressure. Attempted to continue with extraction, however bleeding was excessive and visibility was limited due to patient being on heparin + Elquis  and endotracheal tube being used for intubation.  After weighing the risks vs benefits of finishing extraction, since the tooth was not affected by periapical infection and the decayed portion of tooth was removed, the roots were left in place for now.   The tissues were approximated and trimmed appropriately. The surgical sites were then irrigated with copious amounts of sterile saline. Surgi Foam was placed in the extraction sites. The surgical sites were closed using 3-0 chromic gut sutures as follows: 1 simple interrupted.  END OF PROCEDURE: Thorough oral irrigation with sterile saline was performed. The patient was examined for complications, and seeing none, the dental medicine procedure was deemed to be complete. The throat pack was removed at this time. An oral airway was then placed at the request of the anesthesia team. A series of 4 x 4 gauze were placed in the mouth to aid hemostasis as needed. The patient was then handed over to the anesthesia team for final disposition. After an appropriate amount of time, the patient was extubated and taken to the postanesthsia care unit in stable condition. All counts were correct for the dental medicine procedure.   [place] Benson Norway, D.M.D.

## 2020-10-07 NOTE — Progress Notes (Signed)
Sent to OR in care of CRNA, OR staff, and [employer], [employer] Impella rep.

## 2020-10-07 NOTE — H&P (View-Only) (Signed)
Patient ID: Marcus Johnson., male   DOB: [birth year], 60 y.o.   MRN: [id number]     Advanced Heart Failure Rounding Note  PCP-Cardiologist: No primary care provider on file.   Subjective:    2/27 Milrinone switched to dobutamine 5 mcg.  2/28 Moved to ICU. Dobutamine increased to 7.5 mcg and Norepi 3 mcg added. Diuretics held.  3/1 Impella 5.5 placed.   Creatinine 2.45 today.    Main complaint today is still right arm neuropathic pain s/p Impella, pain meds and gabapentin helped overnight but bad again this morning.   He walked in hall with PT yesterday.   [place] numbers: CVP 7-8 PA 57/12 CI 2.3 Co-ox 51% (early am)  Impella 5.5: P8 Flow 4.2 No alarms LDH 384 => 395 Hgb 10.3 => 10.3 plts 188 => 159  Echo: EF 20-25%, normal RV, PASP 61, dilated IVC.    Objective:   Weight Range: 83 kg Body mass index is 25.88 kg/m.   Vital Signs:   Temp:  [97.7 F (36.5 C)-99.1 F (37.3 C)] 98.2 F (36.8 C) (03/04 0715) Pulse Rate:  [56-106] 92 (03/04 0715) Resp:  [6-33] 18 (03/04 0715) BP: (85-127)/(56-90) 109/90 (03/04 0700) SpO2:  [89 %-100 %] 100 % (03/04 0715) Arterial Line BP: (86-121)/(56-79) 120/76 (03/04 0715) Weight:  [83 kg] 83 kg (03/04 0645) Last BM Date: 10/05/20  Weight change: Filed Weights   10/05/20 0617 10/06/20 0500 10/07/20 0645  Weight: 87.3 kg 81.6 kg 83 kg    Intake/Output:   Intake/Output Summary (Last 24 hours) at 10/07/2020 0750 Last data filed at 10/07/2020 0700 Gross per 24 hour  Intake 1716.29 ml  Output 2162 ml  Net -445.71 ml      Physical Exam   CVP 7-8.  General: NAD Neck: No JVD, no thyromegaly or thyroid nodule.  Lungs: Clear to auscultation bilaterally with normal respiratory effort. CV: Nondisplaced PMI.  Heart regular S1/S2, no S3/S4, no murmur.  No peripheral edema.   Abdomen: Soft, nontender, no hepatosplenomegaly, no distention.  Skin: Intact without lesions or rashes.  Neurologic: Alert and oriented x 3.  Psych:  Normal affect. Extremities: No clubbing or cyanosis.  HEENT: Normal.    Telemetry  NSR 100s with PACs (personally reviewed)  Labs    CBC Recent Labs    10/06/20 0431 10/07/20 0401  WBC 12.1* 13.2*  HGB 10.3* 10.3*  HCT 33.2* 35.3*  MCV 81.4 83.6  PLT 188 546   Basic Metabolic Panel Recent Labs    10/04/20 2006 10/05/20 0504 10/06/20 1327 10/07/20 0401  NA 137   < > 134* 136  K 3.7   < > 3.7 4.6  CL 95*   < > 96* 99  CO2 27   < > 28 29  GLUCOSE 215*   < > 268* 162*  BUN 48*   < > 45* 46*  CREATININE 2.47*   < > 2.51* 2.45*  CALCIUM 9.1   < > 8.9 8.8*  MG 2.7*  --  2.7*  --    < > = values in this interval not displayed.   Liver Function Tests No results for input(s): AST, ALT, ALKPHOS, BILITOT, PROT, ALBUMIN in the last 72 hours. No results for input(s): LIPASE, AMYLASE in the last 72 hours. Cardiac Enzymes No results for input(s): CKTOTAL, CKMB, CKMBINDEX, TROPONINI in the last 72 hours.  BNP: BNP (last 3 results) No results for input(s): BNP in the last 8760 hours.  ProBNP (last 3 results) No  results for input(s): PROBNP in the last 8760 hours.   D-Dimer No results for input(s): DDIMER in the last 72 hours. Hemoglobin A1C No results for input(s): HGBA1C in the last 72 hours. Fasting Lipid Panel No results for input(s): CHOL, HDL, LDLCALC, TRIG, CHOLHDL, LDLDIRECT in the last 72 hours. Thyroid Function Tests No results for input(s): TSH, T4TOTAL, T3FREE, THYROIDAB in the last 72 hours.  Invalid input(s): FREET3  Other results:   Imaging    No results found.   Medications:     Scheduled Medications: . (feeding supplement) PROSource Plus  30 mL Oral BID BM  . sodium chloride   Intravenous Once  . amiodarone  200 mg Oral BID  . bisacodyl  10 mg Rectal Once  . Chlorhexidine Gluconate Cloth  6 each Topical Daily  . feeding supplement  237 mL Oral BID BM  . gabapentin  300 mg Oral TID  . insulin aspart  0-20 Units Subcutaneous TID WC  .  insulin aspart  0-5 Units Subcutaneous QHS  . insulin glargine  10 Units Subcutaneous QHS  . mouth rinse  15 mL Mouth Rinse BID  . multivitamin with minerals  1 tablet Oral Daily  . rosuvastatin  40 mg Oral Daily  . sildenafil  40 mg Oral TID  . sodium chloride flush  10-40 mL Intracatheter Q12H    Infusions: . sodium chloride Stopped (09/28/20 1746)  .  ceFAZolin (ANCEF) IV    . DOBUTamine 2 mcg/kg/min (10/07/20 0700)  . heparin 850 Units/hr (10/07/20 0700)  . impella catheter heparin 50 unit/mL in dextrose 5% 6.1 mL/hr at 10/05/20 0851  . lactated ringers Stopped (10/07/20 0435)  . norepinephrine (LEVOPHED) Adult infusion Stopped (10/04/20 1745)    PRN Medications: sodium chloride, acetaminophen, ALPRAZolam, HYDROmorphone (DILAUDID) injection, ondansetron (ZOFRAN) IV, ondansetron, oxyCODONE, oxyCODONE-acetaminophen, sodium chloride flush, white petrolatum    Assessment/Plan   1. COVID-19 infection:in 1/22. Manifested primarily as nausea/vomiting. Very slow recovery, did not take meds for at least 3 wks but had recently started back on meds just prior to admission.   - Suspect that nausea/poor appetite was actually due to HF.   2.Acute on chronic systolic HF:ICM.Has[employer]tr[employer] ICD. Narrow QRS, not CRT candidate. Has required milrinone multiple times in the past for low output HF. Echo 6/19 with EF 20% and moderately decreased RV systolic function.CPX 4/19 with severe functional limitation due to [facility] 6/19 showed low output and coronary angiography showed no interventional targets.Echo in 4/21 showed EF 25%, mildly decreased RV systolic function. CPX in 5/21 showed severe HF limitation. He has been resistant to LVAD/transplant evaluation, both offered with discussions in past. I worry that we will miss the window for advanced therapies. He was not interested in barostimulatoractivation therapy. History of cardiorenal syndrome/diabetic nephropathy, creatinine up to 2.33  in 1/22, likely due to dehydration with nausea/vomiting.He had been off meds for several weeks, just restarted 3-4 days PTA. Low output persisted despite milrinone and then dobutamine with rise in creatinine to 2.5. Impella 5.5 placed 3/1.  This morning, CVP 7-8 with CI 2.3. PA pressure systolic 88C on sildenafil.  - VAD workup has been initiated, renal function is limiting factor currently.  Will need teeth out today with Dr. Benson Norway.  - Continue Impella P8, good position by echo yesterday.  Continue heparin gtt. Stable LDH and plts.  Limited echo today.  - Hold Lasix today.  - Continue dobutamine 2.  - Repeat co-ox, was low in early am.  - Continue sildenafil 40 mg  tid with pulmonary hypertension.  - Off carvedilol with low output - Will hold spiro with ongoing AKI - Off empagliflozin for now with AKI  - Off Entresto with low BP  2. CAD s/p [id number]:Last cath in 6/19 with patent grafts. No s/s ischemia  -No ASA given apixaban use.  - Continue Crestor 40 mg daily.  3. AKI on CKD Stage 3b: I suspect that this is a combination of cardiorenal syndrome and diabetic nephropathy and possibly ATN. Creatinine stable 1.8 -> 2.1 -> 2.25 -> 2.5  -> 2.4 -> 2.5 -> 2.47 -> 2.45. Need creatinine to trend down for LVAD.  - continue hemodynamic support, no diuretic today.  4. PV:VZSMOLM of very poor control but hgbA1ctrending down. Most recent 7.7%  - Cover with SSI. 5. Atrial flutter: s/p DC-CV 08/21/18.He is in NSR currently but note runs of ?flutter vs fibrillation on device interrogation. Very frequent PACs on telemetry -> confirmed on ECG 2/25.  - Continue amiodarone 200 mg bid.   - Heparin gtt with Impella.  6. Wound R Foot: Partial thickness skin loss noted. Cover with mepilex border and change every 5 days.  7. Right arm pain: Think neuropathic, per Dr. Cyndia Bent patient had stretch of brachial plexus with Impella placement.  - Added gabapentin.   CRITICAL CARE Performed by: Loralie Champagne  Total critical care time: 40 minutes  Critical care time was exclusive of separately billable procedures and treating other patients.  Critical care was necessary to treat or prevent imminent or life-threatening deterioration.  Critical care was time spent personally by me on the following activities: development of treatment plan with patient and/or surrogate as well as nursing, discussions with consultants, evaluation of patient's response to treatment, examination of patient, obtaining history from patient or surrogate, ordering and performing treatments and interventions, ordering and review of laboratory studies, ordering and review of radiographic studies, pulse oximetry and re-evaluation of patient's condition.   Length of Stay: 9  Loralie Champagne, MD  10/07/2020, 7:50 AM  Advanced Heart Failure Team Pager [phone] (M-F; 7a - 4p)  Please contact [facility] for night-coverage after hours (4p -7a ) and weekends on [email or web address]

## 2020-10-07 NOTE — Progress Notes (Signed)
10/07/20     [id number]  I spoke with Dr. Aundra Dubin on 3/2 of this week after seeing the patient.  He agreed with plan to schedule the patient for extractions of infected teeth with anticipated LVAD placement possibly early next week.  He verified it was OK to stop systemic heparin drip 4-6 hours before surgery and continue impella purge as needed per cardiology team. Can resume systemic heparin drip s/p surgery 6-8 hours after.   [place] Benson Norway, DMD

## 2020-10-07 NOTE — Progress Notes (Signed)
Discussed Heparin drip with pharmacy.  Pt has new OR time of 1400 10/07/20  New order to stop Heparin drip at 1000 3/4/222.

## 2020-10-07 NOTE — Anesthesia Procedure Notes (Signed)
Procedure Name: Intubation Date/Time: 10/07/2020 2:58 PM Performed by: Oletta Lamas, CRNA Pre-anesthesia Checklist: Patient identified, Emergency Drugs available, Suction available and Patient being monitored Patient Re-evaluated:Patient Re-evaluated prior to induction Oxygen Delivery Method: Circle System Utilized Preoxygenation: Pre-oxygenation with 100% oxygen Induction Type: IV induction Ventilation: Mask ventilation without difficulty and Oral airway inserted - appropriate to patient size Laryngoscope Size: Glidescope and 4 Grade View: Grade I Tube type: Oral Tube size: 7.5 mm Number of attempts: 1 Airway Equipment and Method: Stylet and Oral airway Placement Confirmation: ETT inserted through vocal cords under direct vision,  positive ETCO2 and breath sounds checked- equal and bilateral Secured at: 23 cm Tube secured with: Tape Dental Injury: Teeth and Oropharynx as per pre-operative assessment

## 2020-10-07 NOTE — Progress Notes (Signed)
[facility] for heparin Indication: atrial fibrillation  Allergies  Allergen Reactions  . Meclizine Hcl Anaphylaxis and Swelling  . Ivabradine Nausea Only    Patient Measurements: Height: 5' 10.5" (179.1 cm) Weight: 83 kg (182 lb 15.7 oz) IBW/kg (Calculated) : 74.15 Heparin Dosing Weight: 88.5 kg   Vital Signs: Temp: 97.34 F (36.3 C) (03/04 1400) Temp Source: Core (03/04 1200) BP: 133/93 (03/04 1400) Pulse Rate: 92 (03/04 1400)  Labs: Recent Labs    10/05/20 0504 10/05/20 1433 10/06/20 0431 10/06/20 1327 10/06/20 2115 10/07/20 0401 10/07/20 1536  HGB 11.0*  --  10.3*  --   --  10.3* 10.9*  HCT 35.8*  --  33.2*  --   --  35.3* 32.0*  PLT 188  --  188  --   --  159  --   APTT 32   < > 39* 41* 51* 57*  --   HEPARINUNFRC 1.08*  --  1.07*  --   --  0.68  --   CREATININE 2.51*  --  2.47* 2.51*  --  2.45*  --    < > = values in this interval not displayed.    Estimated Creatinine Clearance: 34.1 mL/min (A) (by C-G formula based on SCr of 2.45 mg/dL (H)).   Assessment: 64 yom presenting with volume overload with low output HF - plan for impella placement while undergoing LVAD w/u. On apixaban PTA for hx Afib - LD 2/27@2144 .  Patient s/p Impella placement on 3/1 and started on heparin purge (50 units/mL). She is now s/p tooth extraction and pharmacy to restart heparin 6-8 hours post OR. Titrate heparin slowly to goal -impella purge@ 6.1 ml/hr (305 units/hr) -last heparin rate was 850 units/hr   Goal of Therapy:  APTT 56-85 sec Heparin level 0.2-0.5 units/ml Monitor platelets by anticoagulation protocol: Yes   Plan:  -Restart heparin 850 units/hr at midnight -aPTT, heparin level and CBC in am  Hildred Laser, PharmD Clinical Pharmacist **Pharmacist phone directory can now be found on [email or web address] ([email or web address]).  Listed under [place].

## 2020-10-07 NOTE — Progress Notes (Signed)
Rodena Piety, RN called [facility] to get update on patient.  Known that patient has questions before signing consent for dental procedure.  OR RN gave [facility] Dr. Benson Norway contact number.  0600- This nurse left message for Dr. Benson Norway at [phone]. No answer.  [place] called [facility], no answer or answering service.  0254- Updated Rodena Piety in [place] and charge nurse of current situation.

## 2020-10-07 NOTE — Progress Notes (Addendum)
Dr. Gershon Mussel returned call at this time, she asked if the systemic Heparin Drip was still running. Updated Dr. Benson Norway, that the Heparin drip was running at 850 units/hr.  Also noted that I had addressed the heparin drip with the on call cardiologist last night at 2120. Receiving no orders to hold or stop heparin at that time. Notes reviewed from previous day 3/3, nothing in notes about stopping/holding heparin drip overnight.   She stated that she would update the OR about possible postponing surgery due to heparin drip.   Charge nurse updated about situation.

## 2020-10-07 NOTE — Progress Notes (Addendum)
ANTICOAGULATION CONSULT NOTE - Follow Up Consult  Pharmacy Consult for heparin Indication: Afib and Impella  Labs: Recent Labs    10/05/20 0504 10/05/20 1433 10/06/20 0431 10/06/20 1327 10/06/20 2115 10/07/20 0401  HGB 11.0*  --  10.3*  --   --  10.3*  HCT 35.8*  --  33.2*  --   --  35.3*  PLT 188  --  188  --   --  159  APTT 32   < > 39* 41* 51* 57*  HEPARINUNFRC 1.08*  --  1.07*  --   --  0.68  CREATININE 2.51*  --  2.47* 2.51*  --   --    < > = values in this interval not displayed.    Assessment/Plan:  60yo male therapeutic on heparin after rate change. Will continue gtt at current rate of 850 units/hr and confirm stable with additional PTT.   Wynona Neat, PharmD, BCPS  10/07/2020,5:03 AM

## 2020-10-08 ENCOUNTER — Other Ambulatory Visit (HOSPITAL_COMMUNITY): Payer: Medicare HMO

## 2020-10-08 DIAGNOSIS — Z95811 Presence of heart assist device: Secondary | ICD-10-CM | POA: Diagnosis not present

## 2020-10-08 DIAGNOSIS — R570 Cardiogenic shock: Secondary | ICD-10-CM | POA: Diagnosis not present

## 2020-10-08 LAB — COOXEMETRY PANEL
Carboxyhemoglobin: 2.2 % — ABNORMAL HIGH (ref 0.5–1.5)
Methemoglobin: 0.5 % (ref 0.0–1.5)
O2 Saturation: 66.9 %
Total hemoglobin: 7.8 g/dL — ABNORMAL LOW (ref 12.0–16.0)

## 2020-10-08 LAB — CBC
HCT: 33.7 % — ABNORMAL LOW (ref 39.0–52.0)
Hemoglobin: 9.8 g/dL — ABNORMAL LOW (ref 13.0–17.0)
MCH: 24.6 pg — ABNORMAL LOW (ref 26.0–34.0)
MCHC: 29.1 g/dL — ABNORMAL LOW (ref 30.0–36.0)
MCV: 84.5 fL (ref 80.0–100.0)
Platelets: 174 10*3/uL (ref 150–400)
RBC: 3.99 MIL/uL — ABNORMAL LOW (ref 4.22–5.81)
RDW: 19.6 % — ABNORMAL HIGH (ref 11.5–15.5)
WBC: 14.1 10*3/uL — ABNORMAL HIGH (ref 4.0–10.5)
nRBC: 0 % (ref 0.0–0.2)

## 2020-10-08 LAB — BASIC METABOLIC PANEL
Anion gap: 8 (ref 5–15)
BUN: 41 mg/dL — ABNORMAL HIGH (ref 6–20)
CO2: 26 mmol/L (ref 22–32)
Calcium: 8.8 mg/dL — ABNORMAL LOW (ref 8.9–10.3)
Chloride: 102 mmol/L (ref 98–111)
Creatinine, Ser: 2.23 mg/dL — ABNORMAL HIGH (ref 0.61–1.24)
GFR, Estimated: 33 mL/min — ABNORMAL LOW (ref >60)
Glucose, Bld: 153 mg/dL — ABNORMAL HIGH (ref 70–99)
Potassium: 4.6 mmol/L (ref 3.5–5.1)
Sodium: 136 mmol/L (ref 135–145)

## 2020-10-08 LAB — GLUCOSE, CAPILLARY
Glucose-Capillary: 142 mg/dL — ABNORMAL HIGH (ref 70–99)
Glucose-Capillary: 156 mg/dL — ABNORMAL HIGH (ref 70–99)
Glucose-Capillary: 243 mg/dL — ABNORMAL HIGH (ref 70–99)
Glucose-Capillary: 275 mg/dL — ABNORMAL HIGH (ref 70–99)
Glucose-Capillary: 204 mg/dL — ABNORMAL HIGH (ref 70–99)

## 2020-10-08 LAB — LACTATE DEHYDROGENASE: LDH: 366 U/L — ABNORMAL HIGH (ref 98–192)

## 2020-10-08 LAB — HEPARIN LEVEL (UNFRACTIONATED)
Heparin Unfractionated: 0.55 IU/mL (ref 0.30–0.70)
Heparin Unfractionated: 0.65 IU/mL (ref 0.30–0.70)

## 2020-10-08 LAB — APTT: aPTT: 66 seconds — ABNORMAL HIGH (ref 24–36)

## 2020-10-08 MED ORDER — FUROSEMIDE 10 MG/ML IJ SOLN
80.0000 mg | Freq: Once | INTRAMUSCULAR | Status: AC
  Administered 2020-10-08: 80 mg via INTRAVENOUS
  Filled 2020-10-08: qty 8

## 2020-10-08 NOTE — Progress Notes (Signed)
Patient ID: Marcus Johnson., male   DOB: 1961/03/10, 60 y.o.   MRN: [id number]     Advanced Heart Failure Rounding Note  PCP-Cardiologist: No primary care provider on file.   Subjective:    2/27 Milrinone switched to dobutamine 5 mcg.  2/28 Moved to ICU. Dobutamine increased to 7.5 mcg and Norepi 3 mcg added. Diuretics held.  3/1 Impella 5.5 placed.  3/4 Teeth extraction  Creatinine 2.45 -> 2.2   Teeth extracted yesterday. Remains on DBA 2 and Impella at P-8  R arm and hand still sore and numb. Denies SOB, orthopnea or PND. Lasix on hold    Swan numbers: CVP 13 PA 62/26 CI 3.3 Co-ox 67%   Impella 5.5: P8 Flow 4.2 No alarms. Waveforms ok LDH 384 => 395 => 366 Hgb 10.3 => 10.3 => 9.8 plts 188 => 159 => 174  Echo: EF 20-25%, normal RV, PASP 61, dilated IVC.    Objective:   Weight Range: 79.4 kg Body mass index is 24.76 kg/m.   Vital Signs:   Temp:  [97.34 F (36.3 C)-99 F (37.2 C)] 97.88 F (36.6 C) (03/05 1100) Pulse Rate:  [86-99] 90 (03/05 1100) Resp:  [10-28] 17 (03/05 1100) BP: (100-148)/(75-109) 130/91 (03/05 1000) SpO2:  [94 %-100 %] 100 % (03/05 1100) Arterial Line BP: (102-132)/(63-89) 110/66 (03/05 1100) Weight:  [79.4 kg] 79.4 kg (03/05 0600) Last BM Date: 10/04/20  Weight change: Filed Weights   10/06/20 0500 10/07/20 0645 10/08/20 0600  Weight: 81.6 kg 83 kg 79.4 kg    Intake/Output:   Intake/Output Summary (Last 24 hours) at 10/08/2020 1134 Last data filed at 10/08/2020 1100 Gross per 24 hour  Intake 2776.78 ml  Output 2395 ml  Net 381.78 ml      Physical Exam   General:  Sitting up. No resp difficulty HEENT: normal Neck: supple. JVP to jaw.  RIJ swan  Cor: R chest Impella ok PMI nondisplaced. Regular rate & rhythm. No rubs, gallops or murmurs. Lungs: clear Abdomen: soft, nontender, nondistended. No hepatosplenomegaly. No bruits or masses. Good bowel sounds. Extremities: no cyanosis, clubbing, rash, 2+ edema Neuro: alert  & orientedx3, cranial nerves grossly intact. moves all 4 extremities w/o difficulty. Affect pleasant    Telemetry   Sinus 90- 100 Personally reviewed   Labs    CBC Recent Labs    10/07/20 0401 10/07/20 1536 10/08/20 0417  WBC 13.2*  --  14.1*  HGB 10.3* 10.9* 9.8*  HCT 35.3* 32.0* 33.7*  MCV 83.6  --  84.5  PLT 159  --  237   Basic Metabolic Panel Recent Labs    10/06/20 1327 10/07/20 0401 10/07/20 1536 10/08/20 0417  NA 134* 136 140 136  K 3.7 4.6 4.0 4.6  CL 96* 99  --  102  CO2 28 29  --  26  GLUCOSE 268* 162*  --  153*  BUN 45* 46*  --  41*  CREATININE 2.51* 2.45*  --  2.23*  CALCIUM 8.9 8.8*  --  8.8*  MG 2.7*  --   --   --    Liver Function Tests No results for input(s): AST, ALT, ALKPHOS, BILITOT, PROT, ALBUMIN in the last 72 hours. No results for input(s): LIPASE, AMYLASE in the last 72 hours. Cardiac Enzymes No results for input(s): CKTOTAL, CKMB, CKMBINDEX, TROPONINI in the last 72 hours.  BNP: BNP (last 3 results) No results for input(s): BNP in the last 8760 hours.  ProBNP (last 3 results) No  results for input(s): PROBNP in the last 8760 hours.   D-Dimer No results for input(s): DDIMER in the last 72 hours. Hemoglobin A1C No results for input(s): HGBA1C in the last 72 hours. Fasting Lipid Panel No results for input(s): CHOL, HDL, LDLCALC, TRIG, CHOLHDL, LDLDIRECT in the last 72 hours. Thyroid Function Tests No results for input(s): TSH, T4TOTAL, T3FREE, THYROIDAB in the last 72 hours.  Invalid input(s): FREET3  Other results:   Imaging    No results found.   Medications:     Scheduled Medications: . (feeding supplement) PROSource Plus  30 mL Oral BID BM  . sodium chloride   Intravenous Once  . amiodarone  200 mg Oral BID  . Chlorhexidine Gluconate Cloth  6 each Topical Daily  . feeding supplement  237 mL Oral BID BM  . gabapentin  300 mg Oral TID  . insulin aspart  0-20 Units Subcutaneous TID WC  . insulin aspart  0-5  Units Subcutaneous QHS  . insulin glargine  10 Units Subcutaneous QHS  . lidocaine  1 patch Transdermal Q24H  . mouth rinse  15 mL Mouth Rinse BID  . multivitamin with minerals  1 tablet Oral Daily  . rosuvastatin  40 mg Oral Daily  . sildenafil  40 mg Oral TID  . sodium chloride flush  10-40 mL Intracatheter Q12H    Infusions: . sodium chloride Stopped (09/28/20 1746)  . DOBUTamine 2 mcg/kg/min (10/08/20 1100)  . heparin 850 Units/hr (10/08/20 0016)  . impella catheter heparin 50 unit/mL in dextrose 5% 6.1 mL/hr at 10/05/20 0851  . lactated ringers 20 mL/hr at 10/08/20 1100  . lactated ringers    . norepinephrine (LEVOPHED) Adult infusion Stopped (10/04/20 1745)    PRN Medications: sodium chloride, acetaminophen, ALPRAZolam, aminocaproic acid, HYDROmorphone (DILAUDID) injection, ondansetron (ZOFRAN) IV, ondansetron, oxyCODONE, oxyCODONE-acetaminophen, sodium chloride flush, white petrolatum    Assessment/Plan   1. COVID-19 infection:in 1/22. Manifested primarily as nausea/vomiting. Very slow recovery, did not take meds for at least 3 wks but had recently started back on meds just prior to admission.   - Suspect that nausea/poor appetite was actually due to HF.   2.Acute on chronic systolic HF:ICM.HasMedtronic ICD. Narrow QRS, not CRT candidate. Has required milrinone multiple times in the past for low output HF. Echo 6/19 with EF 20% and moderately decreased RV systolic function.CPX 4/19 with severe functional limitation due to [facility] 6/19 showed low output and coronary angiography showed no interventional targets.Echo in 4/21 showed EF 25%, mildly decreased RV systolic function. CPX in 5/21 showed severe HF limitation. He has been resistant to LVAD/transplant evaluation, both offered with discussions in past. I worry that we will miss the window for advanced therapies. He was not interested in barostimulatoractivation therapy. History of cardiorenal  syndrome/diabetic nephropathy, creatinine up to 2.33 in 1/22, likely due to dehydration with nausea/vomiting.He had been off meds for several weeks, just restarted 3-4 days PTA. Low output persisted despite milrinone and then dobutamine with rise in creatinine to 2.5. Impella 5.5 placed 3/1. On DBA 2. Co-ox 67%  Lasix held. This morning, CVP 13 with CI 3.3. PA pressures up - VAD workup has been initiated, renal function is limiting factor currently.  - Continue Impella P8, good position by ech  Continue heparin gtt. Stable LDH and plts.  - Continue dobutamine 2.  - Give lasix 80 IV x 1 today - Continue sildenafil 40 mg tid with pulmonary hypertension.  - Off carvedilol with low output - Will hold spiro  with ongoing AKI - Off empagliflozin for now with AKI  - Off Entresto with low BP  2. CAD s/p [id number]:Last cath in 6/19 with patent grafts. No s/s ischemia -No ASA given apixaban use.  - Continue Crestor 40 mg daily.  3. AKI on CKD Stage 3b: I suspect that this is a combination of cardiorenal syndrome and diabetic nephropathy and possibly ATN. Creatinine stable 1.8 -> 2.1 -> 2.25 -> 2.5  -> 2.4 -> 2.5 -> 2.47 -> 2.45 -> 2.23. Need creatinine to trend down for LVAD.  - continue hemodynamic support - Lasix x 1 today 4. DQ:QIWLNLG of very poor control but hgbA1ctrending down. Most recent 7.7%  - Cover with SSI. 5. Atrial flutter: s/p DC-CV 08/21/18.He is in NSR currently but note runs of ?flutter vs fibrillation on device interrogation. Very frequent PACs on telemetry -> confirmed on ECG 2/25.  - Continue amiodarone 200 mg bid.   - Heparin gtt with Impella.  6. Wound R Foot: Partial thickness skin loss noted. Cover with mepilex border and change every 5 days.  7. Right arm pain: Think neuropathic, per Dr. Cyndia Bent patient had stretch of brachial plexus with Impella placement.  - On gabapentin and pain meds. .   CRITICAL CARE Performed by: Glori Bickers  Total critical care  time: 35 minutes  Critical care time was exclusive of separately billable procedures and treating other patients.  Critical care was necessary to treat or prevent imminent or life-threatening deterioration.  Critical care was time spent personally by me on the following activities: development of treatment plan with patient and/or surrogate as well as nursing, discussions with consultants, evaluation of patient's response to treatment, examination of patient, obtaining history from patient or surrogate, ordering and performing treatments and interventions, ordering and review of laboratory studies, ordering and review of radiographic studies, pulse oximetry and re-evaluation of patient's condition.   Length of Stay: [place], MD  10/08/2020, 11:34 AM  Advanced Heart Failure Team Pager [phone] (M-F; 7a - 4p)  Please contact [facility] for night-coverage after hours (4p -7a ) and weekends on [email or web address]

## 2020-10-08 NOTE — Plan of Care (Signed)
  Problem: Education: Goal: Knowledge of General Education information will improve Description: Including pain rating scale, medication(s)/side effects and non-pharmacologic comfort measures Outcome: Progressing   Problem: Clinical Measurements: Goal: Ability to maintain clinical measurements within normal limits will improve Outcome: Progressing Goal: Will remain free from infection Outcome: Progressing Goal: Diagnostic test results will improve Outcome: Progressing Goal: Cardiovascular complication will be avoided Outcome: Progressing   Problem: Activity: Goal: Risk for activity intolerance will decrease Outcome: Progressing   Problem: Nutrition: Goal: Adequate nutrition will be maintained Outcome: Progressing   Problem: Coping: Goal: Level of anxiety will decrease Outcome: Progressing   Problem: Elimination: Goal: Will not experience complications related to bowel motility Outcome: Progressing Goal: Will not experience complications related to urinary retention Outcome: Progressing   Problem: Pain Managment: Goal: General experience of comfort will improve Outcome: Progressing   Problem: Safety: Goal: Ability to remain free from injury will improve Outcome: Progressing   Problem: Skin Integrity: Goal: Risk for impaired skin integrity will decrease Outcome: Progressing   Problem: Cardiac: Goal: Ability to achieve and maintain adequate cardiopulmonary perfusion will improve Outcome: Progressing

## 2020-10-08 NOTE — Anesthesia Postprocedure Evaluation (Signed)
Anesthesia Post Note  Patient: Marcus Johnson.  Procedure(s) Performed: MULTIPLE EXTRACTION WITH ALVEOLOPLASTY (N/A Mouth)     Patient location during evaluation: ICU Anesthesia Type: General Level of consciousness: awake and alert Pain management: pain level controlled Vital Signs Assessment: post-procedure vital signs reviewed and stable Respiratory status: respiratory function stable and spontaneous breathing Cardiovascular status: stable and blood pressure returned to baseline Anesthetic complications: no   No complications documented.           Brennan Bailey

## 2020-10-08 NOTE — Progress Notes (Signed)
[place] for heparin Indication: atrial fibrillation  Allergies  Allergen Reactions  . Meclizine Hcl Anaphylaxis and Swelling  . Ivabradine Nausea Only    Patient Measurements: Height: 5' 10.5" (179.1 cm) Weight: 79.4 kg (175 lb 0.7 oz) IBW/kg (Calculated) : 74.15 Heparin Dosing Weight: 88.5 kg   Vital Signs: Temp: 97.88 F (36.6 C) (03/05 0900) Temp Source: Core (03/05 0800) BP: 148/109 (03/05 0900) Pulse Rate: 88 (03/05 0900)  Labs: Recent Labs    10/06/20 0431 10/06/20 1327 10/06/20 2115 10/07/20 0401 10/07/20 1536 10/08/20 0417 10/08/20 0744  HGB 10.3*  --   --  10.3* 10.9* 9.8*  --   HCT 33.2*  --   --  35.3* 32.0* 33.7*  --   PLT 188  --   --  159  --  174  --   APTT 39* 41* 51* 57*  --   --  66*  HEPARINUNFRC 1.07*  --   --  0.68  --  0.55 0.65  CREATININE 2.47* 2.51*  --  2.45*  --  2.23*  --     Estimated Creatinine Clearance: 37.4 mL/min (A) (by C-G formula based on SCr of 2.23 mg/dL (H)).   Assessment: 23 yom presenting with volume overload with low output HF - plan for impella placement while undergoing LVAD w/u. On apixaban PTA for hx Afib - LD 2/27@2144 .  Patient s/p Impella placement on 3/1 and started on heparin purge (50 units/mL). He is now s/p tooth extraction 3/4 and heparin resumed.   -impella purge@ 6.1 ml/hr (305 units/hr) -heparin rate is 850 units/hr -Aptt within range at 66s, heparin level 0.65 -Hgb stable 9.8, plt 174  Goal of Therapy:  APTT 56-85 sec Heparin level 0.2-0.5 units/ml Monitor platelets by anticoagulation protocol: Yes   Plan:  -Continue heparin at 850 units/hr -aPTT, heparin level and CBC in am  Erin Hearing PharmD., BCPS Clinical Pharmacist 10/08/2020 10:03 AM

## 2020-10-09 DIAGNOSIS — I5023 Acute on chronic systolic (congestive) heart failure: Secondary | ICD-10-CM | POA: Diagnosis not present

## 2020-10-09 DIAGNOSIS — Z95811 Presence of heart assist device: Secondary | ICD-10-CM | POA: Diagnosis not present

## 2020-10-09 LAB — BASIC METABOLIC PANEL
Anion gap: 10 (ref 5–15)
BUN: 44 mg/dL — ABNORMAL HIGH (ref 6–20)
CO2: 26 mmol/L (ref 22–32)
Calcium: 8.8 mg/dL — ABNORMAL LOW (ref 8.9–10.3)
Chloride: 99 mmol/L (ref 98–111)
Creatinine, Ser: 2.15 mg/dL — ABNORMAL HIGH (ref 0.61–1.24)
GFR, Estimated: 35 mL/min — ABNORMAL LOW (ref >60)
Glucose, Bld: 175 mg/dL — ABNORMAL HIGH (ref 70–99)
Potassium: 3.8 mmol/L (ref 3.5–5.1)
Sodium: 135 mmol/L (ref 135–145)

## 2020-10-09 LAB — COOXEMETRY PANEL
Carboxyhemoglobin: 2 % — ABNORMAL HIGH (ref 0.5–1.5)
Methemoglobin: 1.1 % (ref 0.0–1.5)
O2 Saturation: 57.6 %
Total hemoglobin: 9.7 g/dL — ABNORMAL LOW (ref 12.0–16.0)

## 2020-10-09 LAB — CBC
HCT: 31 % — ABNORMAL LOW (ref 39.0–52.0)
Hemoglobin: 9 g/dL — ABNORMAL LOW (ref 13.0–17.0)
MCH: 24.3 pg — ABNORMAL LOW (ref 26.0–34.0)
MCHC: 29 g/dL — ABNORMAL LOW (ref 30.0–36.0)
MCV: 83.8 fL (ref 80.0–100.0)
Platelets: 166 10*3/uL (ref 150–400)
RBC: 3.7 MIL/uL — ABNORMAL LOW (ref 4.22–5.81)
RDW: 19.3 % — ABNORMAL HIGH (ref 11.5–15.5)
WBC: 12.3 10*3/uL — ABNORMAL HIGH (ref 4.0–10.5)
nRBC: 0 % (ref 0.0–0.2)

## 2020-10-09 LAB — HEPARIN LEVEL (UNFRACTIONATED): Heparin Unfractionated: 0.52 IU/mL (ref 0.30–0.70)

## 2020-10-09 LAB — LACTATE DEHYDROGENASE: LDH: 350 U/L — ABNORMAL HIGH (ref 98–192)

## 2020-10-09 LAB — APTT: aPTT: 63 seconds — ABNORMAL HIGH (ref 24–36)

## 2020-10-09 MED ORDER — FUROSEMIDE 10 MG/ML IJ SOLN
60.0000 mg | Freq: Once | INTRAMUSCULAR | Status: AC
  Administered 2020-10-09: 60 mg via INTRAVENOUS
  Filled 2020-10-09: qty 6

## 2020-10-09 MED ORDER — POTASSIUM CHLORIDE CRYS ER 20 MEQ PO TBCR
20.0000 meq | EXTENDED_RELEASE_TABLET | Freq: Once | ORAL | Status: AC
  Administered 2020-10-09: 20 meq via ORAL
  Filled 2020-10-09: qty 1

## 2020-10-09 NOTE — Progress Notes (Signed)
Patient ID: Marcus Doshi., male   DOB: 1961-07-05, 60 y.o.   MRN: [id number]     Advanced Heart Failure Rounding Note  PCP-Cardiologist: No primary care provider on file.   Subjective:    2/27 Milrinone switched to dobutamine 5 mcg.  2/28 Moved to ICU. Dobutamine increased to 7.5 mcg and Norepi 3 mcg added. Diuretics held.  3/1 Impella 5.5 placed.  3/4 Teeth removed  Creatinine 2.45 => 2.23 => pending  Main complaint today is still right arm neuropathic pain s/p Impella, pain meds and gabapentin helped overnight but bad again this morning. Has lidocaine patch.   He walked in hall with PT yesterday.   Swan numbers: CVP 9-10 PA 47/13 CI 2.8 Co-ox 58%  Impella 5.5: P8 Flow 4.2 No alarms LDH 384 => 395 => 350 Hgb 10.3 => 10.3 => 9 plts 188 => 159 => 166  Echo: EF 20-25%, normal RV, PASP 61, dilated IVC.    Objective:   Weight Range: 91.9 kg Body mass index is 28.66 kg/m.   Vital Signs:   Temp:  [97 F (36.1 C)-98.2 F (36.8 C)] 97.52 F (36.4 C) (03/06 0700) Pulse Rate:  [80-99] 89 (03/06 0700) Resp:  [9-28] 12 (03/06 0700) BP: (89-148)/(66-109) 123/66 (03/06 0700) SpO2:  [86 %-100 %] 94 % (03/06 0700) Arterial Line BP: (79-119)/(53-96) 90/86 (03/06 0645) Weight:  [91.2 kg-91.9 kg] 91.9 kg (03/06 0600) Last BM Date: 10/04/20  Weight change: Filed Weights   10/08/20 0600 10/08/20 1500 10/09/20 0600  Weight: 79.4 kg 91.2 kg 91.9 kg    Intake/Output:   Intake/Output Summary (Last 24 hours) at 10/09/2020 0732 Last data filed at 10/09/2020 0700 Gross per 24 hour  Intake 2257.86 ml  Output 1785 ml  Net 472.86 ml      Physical Exam  CVP 9-10.  General: NAD Neck: JVP 8-9 cm, no thyromegaly or thyroid nodule.  Lungs: Clear to auscultation bilaterally with normal respiratory effort. CV: Nondisplaced PMI.  Heart regular S1/S2, no S3/S4, no murmur.  No peripheral edema.   Abdomen: Soft, nontender, no hepatosplenomegaly, no distention.  Skin: Intact  without lesions or rashes.  Neurologic: Alert and oriented x 3.  Psych: Normal affect. Extremities: No clubbing or cyanosis.  HEENT: Normal.    Telemetry  NSR 100s with PACs (personally reviewed)  Labs    CBC Recent Labs    10/08/20 0417 10/09/20 0300  WBC 14.1* 12.3*  HGB 9.8* 9.0*  HCT 33.7* 31.0*  MCV 84.5 83.8  PLT 174 564   Basic Metabolic Panel Recent Labs    10/06/20 1327 10/07/20 0401 10/07/20 1536 10/08/20 0417  NA 134* 136 140 136  K 3.7 4.6 4.0 4.6  CL 96* 99  --  102  CO2 28 29  --  26  GLUCOSE 268* 162*  --  153*  BUN 45* 46*  --  41*  CREATININE 2.51* 2.45*  --  2.23*  CALCIUM 8.9 8.8*  --  8.8*  MG 2.7*  --   --   --    Liver Function Tests No results for input(s): AST, ALT, ALKPHOS, BILITOT, PROT, ALBUMIN in the last 72 hours. No results for input(s): LIPASE, AMYLASE in the last 72 hours. Cardiac Enzymes No results for input(s): CKTOTAL, CKMB, CKMBINDEX, TROPONINI in the last 72 hours.  BNP: BNP (last 3 results) No results for input(s): BNP in the last 8760 hours.  ProBNP (last 3 results) No results for input(s): PROBNP in the last 8760 hours.  D-Dimer No results for input(s): DDIMER in the last 72 hours. Hemoglobin A1C No results for input(s): HGBA1C in the last 72 hours. Fasting Lipid Panel No results for input(s): CHOL, HDL, LDLCALC, TRIG, CHOLHDL, LDLDIRECT in the last 72 hours. Thyroid Function Tests No results for input(s): TSH, T4TOTAL, T3FREE, THYROIDAB in the last 72 hours.  Invalid input(s): FREET3  Other results:   Imaging    No results found.   Medications:     Scheduled Medications: . (feeding supplement) PROSource Plus  30 mL Oral BID BM  . sodium chloride   Intravenous Once  . amiodarone  200 mg Oral BID  . Chlorhexidine Gluconate Cloth  6 each Topical Daily  . feeding supplement  237 mL Oral BID BM  . furosemide  60 mg Intravenous Once  . gabapentin  300 mg Oral TID  . insulin aspart  0-20 Units  Subcutaneous TID WC  . insulin aspart  0-5 Units Subcutaneous QHS  . insulin glargine  10 Units Subcutaneous QHS  . lidocaine  1 patch Transdermal Q24H  . mouth rinse  15 mL Mouth Rinse BID  . multivitamin with minerals  1 tablet Oral Daily  . rosuvastatin  40 mg Oral Daily  . sildenafil  40 mg Oral TID  . sodium chloride flush  10-40 mL Intracatheter Q12H    Infusions: . sodium chloride Stopped (09/28/20 1746)  . DOBUTamine 2 mcg/kg/min (10/09/20 0700)  . heparin 850 Units/hr (10/09/20 0700)  . impella catheter heparin 50 unit/mL in dextrose 5% 6.1 mL/hr at 10/05/20 0851  . lactated ringers 20 mL/hr at 10/09/20 0700  . lactated ringers    . norepinephrine (LEVOPHED) Adult infusion Stopped (10/04/20 1745)    PRN Medications: sodium chloride, acetaminophen, ALPRAZolam, aminocaproic acid, HYDROmorphone (DILAUDID) injection, ondansetron (ZOFRAN) IV, ondansetron, oxyCODONE, oxyCODONE-acetaminophen, sodium chloride flush, white petrolatum    Assessment/Plan   1. COVID-19 infection:in 1/22. Manifested primarily as nausea/vomiting. Very slow recovery, did not take meds for at least 3 wks but had recently started back on meds just prior to admission.   - Suspect that nausea/poor appetite was actually due to HF.   2.Acute on chronic systolic HF:ICM.HasMedtronic ICD. Narrow QRS, not CRT candidate. Has required milrinone multiple times in the past for low output HF. Echo 6/19 with EF 20% and moderately decreased RV systolic function.CPX 4/19 with severe functional limitation due to [facility] 6/19 showed low output and coronary angiography showed no interventional targets.Echo in 4/21 showed EF 25%, mildly decreased RV systolic function. CPX in 5/21 showed severe HF limitation. He has been resistant to LVAD/transplant evaluation, both offered with discussions in past. I worry that we will miss the window for advanced therapies. He was not interested in barostimulatoractivation  therapy. History of cardiorenal syndrome/diabetic nephropathy, creatinine up to 2.33 in 1/22, likely due to dehydration with nausea/vomiting.He had been off meds for several weeks, just restarted 3-4 days PTA. Low output persisted despite milrinone and then dobutamine with rise in creatinine to 2.5. Impella 5.5 placed 3/1.  This morning, CVP 9-10 with CI 2.8. PA pressure systolic [id number] on sildenafil.  - VAD workup has been initiated, renal function is limiting factor currently.  Has had teeth out.  - Continue Impella P8, good position by last echo.  Continue heparin gtt. Stable LDH and plts.  - Will get Lasix 60 mg IV x 1 today.  - Continue dobutamine 2.  - Continue sildenafil 40 mg tid with pulmonary hypertension.  - Off carvedilol with low output -  Will hold spiro with ongoing AKI - Off empagliflozin for now with AKI  - Off Entresto with low BP  2. CAD s/p [id number]:Last cath in 6/19 with patent grafts. No s/s ischemia  -No ASA given apixaban use.  - Continue Crestor 40 mg daily.  3. AKI on CKD Stage 3b: I suspect that this is a combination of cardiorenal syndrome and diabetic nephropathy and possibly ATN. Creatinine stable 1.8 -> 2.1 -> 2.25 -> 2.5  -> 2.4 -> 2.5 -> 2.47 -> 2.45 => 2.23 => pending. Need creatinine to trend down for LVAD.  - continue hemodynamic support.  4. IR:WERXVQM of very poor control but hgbA1ctrending down. Most recent 7.7%  - Cover with SSI. 5. Atrial flutter: s/p DC-CV 08/21/18.He is in NSR currently but note runs of ?flutter vs fibrillation on device interrogation. Very frequent PACs on telemetry -> confirmed on ECG 2/25.  - Continue amiodarone 200 mg bid.   - Heparin gtt with Impella.  6. Wound R Foot: Partial thickness skin loss noted. Cover with mepilex border and change every 5 days.  7. Right arm pain: Think neuropathic, per Dr. Cyndia Bent patient had stretch of brachial plexus with Impella placement.  - Continue gabapentin.  - Lidocaine patch.    Walk in hall today.   CRITICAL CARE Performed by: Loralie Champagne  Total critical care time: 40 minutes  Critical care time was exclusive of separately billable procedures and treating other patients.  Critical care was necessary to treat or prevent imminent or life-threatening deterioration.  Critical care was time spent personally by me on the following activities: development of treatment plan with patient and/or surrogate as well as nursing, discussions with consultants, evaluation of patient's response to treatment, examination of patient, obtaining history from patient or surrogate, ordering and performing treatments and interventions, ordering and review of laboratory studies, ordering and review of radiographic studies, pulse oximetry and re-evaluation of patient's condition.   Length of Stay: 16  Loralie Champagne, MD  10/09/2020, 7:32 AM  Advanced Heart Failure Team Pager [phone] (M-F; 7a - 4p)  Please contact [facility] for night-coverage after hours (4p -7a ) and weekends on [email or web address]

## 2020-10-09 NOTE — Plan of Care (Signed)
  Problem: Clinical Measurements: Goal: Ability to maintain clinical measurements within normal limits will improve Outcome: Progressing Goal: Will remain free from infection Outcome: Progressing Goal: Diagnostic test results will improve Outcome: Progressing Goal: Cardiovascular complication will be avoided Outcome: Progressing   Problem: Activity: Goal: Risk for activity intolerance will decrease Outcome: Progressing   Problem: Nutrition: Goal: Adequate nutrition will be maintained Outcome: Progressing   Problem: Coping: Goal: Level of anxiety will decrease Outcome: Progressing   Problem: Pain Managment: Goal: General experience of comfort will improve Outcome: Progressing   Problem: Safety: Goal: Ability to remain free from injury will improve Outcome: Progressing   Problem: Skin Integrity: Goal: Risk for impaired skin integrity will decrease Outcome: Progressing   Problem: Cardiac: Goal: Ability to achieve and maintain adequate cardiopulmonary perfusion will improve Outcome: Progressing

## 2020-10-09 NOTE — Progress Notes (Signed)
[place] for heparin Indication: atrial fibrillation  Allergies  Allergen Reactions  . Meclizine Hcl Anaphylaxis and Swelling  . Ivabradine Nausea Only    Patient Measurements: Height: 5' 10.5" (179.1 cm) Weight: 91.9 kg (202 lb 9.6 oz) IBW/kg (Calculated) : 74.15 Heparin Dosing Weight: 88.5 kg   Vital Signs: Temp: 97.7 F (36.5 C) (03/06 0800) Temp Source: Core (03/06 0800) BP: 186/150 (03/06 0800) Pulse Rate: 93 (03/06 0800)  Labs: Recent Labs    10/07/20 0401 10/07/20 1536 10/08/20 0417 10/08/20 0744 10/09/20 0300 10/09/20 0734  HGB 10.3* 10.9* 9.8*  --  9.0*  --   HCT 35.3* 32.0* 33.7*  --  31.0*  --   PLT 159  --  174  --  166  --   APTT 57*  --   --  66* 63*  --   HEPARINUNFRC 0.68  --  0.55 0.65 0.52  --   CREATININE 2.45*  --  2.23*  --   --  2.15*    Estimated Creatinine Clearance: 42.5 mL/min (A) (by C-G formula based on SCr of 2.15 mg/dL (H)).   Assessment: 75 yom presenting with volume overload with low output HF - plan for impella placement while undergoing LVAD w/u. On apixaban PTA for hx Afib - LD 2/27@2144 .  Patient s/p Impella placement on 3/1 and started on heparin purge (50 units/mL). He is now s/p tooth extraction 3/4 and heparin has been resumed.   -impella purge@ 6.3 ml/hr (315 units/hr) -heparin rate is 850 units/hr -Aptt just below range at 63s, heparin level at goal 0.5 -Hgb stable 9.0, plt 166  Patient was last given apixaban on 2/27, even with reduced renal function and scr of 2.15 would expect it to be cleared. Aptt and heparin levels do not accurately correlate. Do no feel need to adjust heparin rate at this time.   Goal of Therapy:  APTT 56-85 sec Heparin level 0.2-0.5 units/ml Monitor platelets by anticoagulation protocol: Yes   Plan:  -Continue heparin at 850 units/hr -aPTT, heparin level and CBC in am  Erin Hearing PharmD., BCPS Clinical Pharmacist 10/09/2020 9:45 AM

## 2020-10-10 ENCOUNTER — Inpatient Hospital Stay (HOSPITAL_COMMUNITY): Payer: Medicare HMO

## 2020-10-10 ENCOUNTER — Encounter: Payer: Medicare HMO | Admitting: Internal Medicine

## 2020-10-10 ENCOUNTER — Encounter (HOSPITAL_COMMUNITY): Payer: Self-pay | Admitting: Dentistry

## 2020-10-10 DIAGNOSIS — I5023 Acute on chronic systolic (congestive) heart failure: Secondary | ICD-10-CM | POA: Diagnosis not present

## 2020-10-10 DIAGNOSIS — K029 Dental caries, unspecified: Secondary | ICD-10-CM

## 2020-10-10 DIAGNOSIS — K08199 Complete loss of teeth due to other specified cause, unspecified class: Secondary | ICD-10-CM

## 2020-10-10 DIAGNOSIS — Z95810 Presence of automatic (implantable) cardiac defibrillator: Secondary | ICD-10-CM

## 2020-10-10 DIAGNOSIS — Z95811 Presence of heart assist device: Secondary | ICD-10-CM | POA: Diagnosis not present

## 2020-10-10 DIAGNOSIS — R079 Chest pain, unspecified: Secondary | ICD-10-CM | POA: Diagnosis not present

## 2020-10-10 DIAGNOSIS — I255 Ischemic cardiomyopathy: Secondary | ICD-10-CM

## 2020-10-10 DIAGNOSIS — I5022 Chronic systolic (congestive) heart failure: Secondary | ICD-10-CM

## 2020-10-10 LAB — GLUCOSE, CAPILLARY
Glucose-Capillary: 191 mg/dL — ABNORMAL HIGH (ref 70–99)
Glucose-Capillary: 230 mg/dL — ABNORMAL HIGH (ref 70–99)
Glucose-Capillary: 215 mg/dL — ABNORMAL HIGH (ref 70–99)
Glucose-Capillary: 250 mg/dL — ABNORMAL HIGH (ref 70–99)
Glucose-Capillary: 165 mg/dL — ABNORMAL HIGH (ref 70–99)
Glucose-Capillary: 265 mg/dL — ABNORMAL HIGH (ref 70–99)
Glucose-Capillary: 194 mg/dL — ABNORMAL HIGH (ref 70–99)
Glucose-Capillary: 181 mg/dL — ABNORMAL HIGH (ref 70–99)

## 2020-10-10 LAB — HEPARIN LEVEL (UNFRACTIONATED): Heparin Unfractionated: 0.32 IU/mL (ref 0.30–0.70)

## 2020-10-10 LAB — BASIC METABOLIC PANEL
Anion gap: 8 (ref 5–15)
BUN: 41 mg/dL — ABNORMAL HIGH (ref 6–20)
CO2: 28 mmol/L (ref 22–32)
Calcium: 8.8 mg/dL — ABNORMAL LOW (ref 8.9–10.3)
Chloride: 99 mmol/L (ref 98–111)
Creatinine, Ser: 2.18 mg/dL — ABNORMAL HIGH (ref 0.61–1.24)
GFR, Estimated: 34 mL/min — ABNORMAL LOW (ref >60)
Glucose, Bld: 216 mg/dL — ABNORMAL HIGH (ref 70–99)
Potassium: 4.1 mmol/L (ref 3.5–5.1)
Sodium: 135 mmol/L (ref 135–145)

## 2020-10-10 LAB — ECHOCARDIOGRAM LIMITED
Height: 70.5 in
Weight: 3224.01 oz

## 2020-10-10 LAB — COOXEMETRY PANEL
Carboxyhemoglobin: 2 % — ABNORMAL HIGH (ref 0.5–1.5)
Methemoglobin: 0.7 % (ref 0.0–1.5)
O2 Saturation: 70.9 %
Total hemoglobin: 9.7 g/dL — ABNORMAL LOW (ref 12.0–16.0)

## 2020-10-10 LAB — CBC
HCT: 28.1 % — ABNORMAL LOW (ref 39.0–52.0)
Hemoglobin: 8.5 g/dL — ABNORMAL LOW (ref 13.0–17.0)
MCH: 25 pg — ABNORMAL LOW (ref 26.0–34.0)
MCHC: 30.2 g/dL (ref 30.0–36.0)
MCV: 82.6 fL (ref 80.0–100.0)
Platelets: 166 10*3/uL (ref 150–400)
RBC: 3.4 MIL/uL — ABNORMAL LOW (ref 4.22–5.81)
RDW: 19.3 % — ABNORMAL HIGH (ref 11.5–15.5)
WBC: 11.3 10*3/uL — ABNORMAL HIGH (ref 4.0–10.5)
nRBC: 0 % (ref 0.0–0.2)

## 2020-10-10 LAB — ECHO INTRAOPERATIVE TEE
AV Mean grad: 4 mmHg
AV Peak grad: 6.7 mmHg
Ao pk vel: 1.29 m/s
Height: 70.5 in
S' Lateral: 5.5 cm
Weight: 3091.2 oz

## 2020-10-10 LAB — APTT
aPTT: 57 seconds — ABNORMAL HIGH (ref 24–36)
aPTT: 69 seconds — ABNORMAL HIGH (ref 24–36)

## 2020-10-10 LAB — LACTATE DEHYDROGENASE: LDH: 328 U/L — ABNORMAL HIGH (ref 98–192)

## 2020-10-10 MED ORDER — NOREPINEPHRINE 4 MG/250ML-% IV SOLN
2.0000 ug/min | INTRAVENOUS | Status: DC
  Administered 2020-10-10 – 2020-10-11 (×2): 3 ug/min via INTRAVENOUS
  Administered 2020-10-12: 3.013 ug/min via INTRAVENOUS
  Administered 2020-10-13: 3 ug/min via INTRAVENOUS
  Filled 2020-10-10 (×2): qty 250

## 2020-10-10 NOTE — Progress Notes (Signed)
[place] for heparin Indication: atrial fibrillation  Allergies  Allergen Reactions  . Meclizine Hcl Anaphylaxis and Swelling  . Ivabradine Nausea Only    Patient Measurements: Height: 5' 10.5" (179.1 cm) Weight: 91.4 kg (201 lb 8 oz) IBW/kg (Calculated) : 74.15 Heparin Dosing Weight: 88.5 kg   Vital Signs: Temp: 98.96 F (37.2 C) (03/07 0700) Temp Source: Core (03/06 2000) BP: 123/105 (03/07 0700) Pulse Rate: 93 (03/07 0700)  Labs: Recent Labs    10/08/20 0417 10/08/20 0744 10/09/20 0300 10/09/20 0734 10/10/20 0224  HGB 9.8*  --  9.0*  --  8.5*  HCT 33.7*  --  31.0*  --  28.1*  PLT 174  --  166  --  166  APTT  --  66* 63*  --  57*  HEPARINUNFRC 0.55 0.65 0.52  --  0.32  CREATININE 2.23*  --   --  2.15* 2.18*    Estimated Creatinine Clearance: 41.9 mL/min (A) (by C-G formula based on SCr of 2.18 mg/dL (H)).   Assessment: 16 yom presenting with volume overload with low output HF - plan for impella placement while undergoing LVAD w/u. On apixaban PTA for hx Afib - LD 2/27@2144 .  Patient s/p Impella placement on 3/1 and started on heparin purge (50 units/mL). He is now s/p tooth extraction 3/4 and heparin has been resumed.   Impella remains at P8. Pressures stable in upper 500s. Heparin purge running at 6.8 ml/hr (340 units/hr). APTT subtherapeutic at 57. Heparin level at lower end of therapeutic at 0.32. Levels very close to correlating. Hgb down from 9s to 8.5, pltc wnl. No overt bleeding or infusion issues.   Patient was last given apixaban on 2/27, even with reduced renal function and scr of 2.18 would expect it to be cleared. Patient has higher goal given additional indicatio for [facility] with hx of afib. Will increase systemic heparin slightly this morning.  Goal of Therapy:  APTT 66-85 sec Heparin level 0.3-0.5 units/ml Monitor platelets by anticoagulation protocol: Yes   Plan:  -Increase systemic heparin to 900  units/hr -Check 6-hr aptt -Monitor daily aPTT, HL, CBC, s/sx bleeding  [name], PharmD, BCPS PGY2 Cardiology Pharmacy Resident Phone: [phone] 10/10/2020  8:10 AM  Please check [email or web address] for unit-specific pharmacy phone numbers.
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
## 2020-10-10 NOTE — Progress Notes (Signed)
  Echocardiogram 2D Echocardiogram has been performed.  Marcus Johnson 10/10/2020, 8:55 AM

## 2020-10-10 NOTE — Plan of Care (Signed)

## 2020-10-10 NOTE — Progress Notes (Signed)
[place] for heparin Indication: atrial fibrillation  Allergies  Allergen Reactions  . Meclizine Hcl Anaphylaxis and Swelling  . Ivabradine Nausea Only    Patient Measurements: Height: 5' 10.5" (179.1 cm) Weight: 91.4 kg (201 lb 8 oz) IBW/kg (Calculated) : 74.15 Heparin Dosing Weight: 88.5 kg   Vital Signs: Temp: 98.2 F (36.8 C) (03/07 1500) Temp Source: Core (Comment) (03/07 0800) BP: 107/80 (03/07 1400) Pulse Rate: 92 (03/07 1400)  Labs: Recent Labs    10/08/20 0417 10/08/20 0417 10/08/20 0744 10/09/20 0300 10/09/20 0734 10/10/20 0224 10/10/20 1435  HGB 9.8*  --   --  9.0*  --  8.5*  --   HCT 33.7*  --   --  31.0*  --  28.1*  --   PLT 174  --   --  166  --  166  --   APTT  --    < > 66* 63*  --  57* 69*  HEPARINUNFRC 0.55  --  0.65 0.52  --  0.32  --   CREATININE 2.23*  --   --   --  2.15* 2.18*  --    < > = values in this interval not displayed.    Estimated Creatinine Clearance: 41.9 mL/min (A) (by C-G formula based on SCr of 2.18 mg/dL (H)).   Assessment: 6 yom presenting with volume overload with low output HF - plan for impella placement while undergoing LVAD w/u. On apixaban PTA for hx Afib - LD 2/27@2144 .  Patient s/p Impella placement on 3/1 and started on heparin purge (50 units/mL). He is now s/p tooth extraction 3/4 and heparin has been resumed.   Patient was last given apixaban on 2/27, even with reduced renal function and scr of 2.18 would expect it to be cleared. Patient has higher goal given additional indication for [facility] with hx of afib.  Impella remains at P8. Pressures stable in upper 500s. Heparin purge running at 6.4 ml/hr (320 units/hr). APTT is therapeutic at 69. Hgb 8.5, pltc WNL. No overt bleeding or infusion issues.   Goal of Therapy:  APTT 66-85 sec Heparin level 0.3-0.5 units/ml Monitor platelets by anticoagulation protocol: Yes   Plan:  -Continue systemic heparin to 900 units/hr -Monitor  daily aPTT, HL, CBC, s/sx bleeding  Antonietta Jewel, PharmD, BCCCP Clinical Pharmacist  Phone: [phone] 10/10/2020 4:05 PM  Please check AMION for all [place] phone numbers After 10:00 PM, call [place] [phone]

## 2020-10-10 NOTE — Progress Notes (Addendum)
Patient ID: Marcus Wishon., male   DOB: 20-Sep-1960, 61 y.o.   MRN: [id number]     Advanced Heart Failure Rounding Note  PCP-Cardiologist: No primary care provider on file.   Subjective:    2/27 Milrinone switched to dobutamine 5 mcg.  2/28 Moved to ICU. Dobutamine increased to 7.5 mcg and Norepi 3 mcg added. Diuretics held.  3/1 Impella 5.5 placed.  3/4 Teeth removed  Creatinine 2.45 => 2.23 => 2.18. Remains on dobutamine 2.   Main complaint today is still right arm neuropathic pain s/p Impella, pain meds and gabapentin helped overnight.  Has lidocaine patch. Pain is up and down.   He walked in hall today, made a full lap.    Swan numbers: CVP 7-8 PA 34/19 CI 2.4 Co-ox 71%  Impella 5.5: P8 Flow 4.2 No alarms LDH 384 => 395 => 350 => 328 Hgb 10.3 => 10.3 => 9 => 8.3 plts 188 => 159 => 166 => 166  Echo: EF 20-25%, normal RV, PASP 61, dilated IVC.    Objective:   Weight Range: 91.4 kg Body mass index is 28.5 kg/m.   Vital Signs:   Temp:  [97.34 F (36.3 C)-100.22 F (37.9 C)] 98.6 F (37 C) (03/07 0800) Pulse Rate:  [68-100] 93 (03/07 0800) Resp:  [8-23] 19 (03/07 0800) BP: (95-123)/(46-105) 123/105 (03/07 0700) SpO2:  [90 %-100 %] 98 % (03/07 0800) Arterial Line BP: (87-120)/(55-69) 110/62 (03/06 2130) Weight:  [91.4 kg] 91.4 kg (03/07 0600) Last BM Date: 10/04/20  Weight change: Filed Weights   10/08/20 1500 10/09/20 0600 10/10/20 0600  Weight: 91.2 kg 91.9 kg 91.4 kg    Intake/Output:   Intake/Output Summary (Last 24 hours) at 10/10/2020 0905 Last data filed at 10/10/2020 0800 Gross per 24 hour  Intake 2046.55 ml  Output 3385 ml  Net -1338.45 ml      Physical Exam  CVP 7-8.  General: NAD Neck: No JVD, no thyromegaly or thyroid nodule.  Lungs: Clear to auscultation bilaterally with normal respiratory effort. CV: Nondisplaced PMI.  Heart regular S1/S2, no S3/S4, no murmur.  No peripheral edema.   Abdomen: Soft, nontender, no  hepatosplenomegaly, no distention.  Skin: Intact without lesions or rashes.  Neurologic: Alert and oriented x 3.  Psych: Normal affect. Extremities: No clubbing or cyanosis.  HEENT: Normal.     Telemetry  NSR 90s with PACs (personally reviewed)  Labs    CBC Recent Labs    10/09/20 0300 10/10/20 0224  WBC 12.3* 11.3*  HGB 9.0* 8.5*  HCT 31.0* 28.1*  MCV 83.8 82.6  PLT 166 517   Basic Metabolic Panel Recent Labs    10/09/20 0734 10/10/20 0224  NA 135 135  K 3.8 4.1  CL 99 99  CO2 26 28  GLUCOSE 175* 216*  BUN 44* 41*  CREATININE 2.15* 2.18*  CALCIUM 8.8* 8.8*   Liver Function Tests No results for input(s): AST, ALT, ALKPHOS, BILITOT, PROT, ALBUMIN in the last 72 hours. No results for input(s): LIPASE, AMYLASE in the last 72 hours. Cardiac Enzymes No results for input(s): CKTOTAL, CKMB, CKMBINDEX, TROPONINI in the last 72 hours.  BNP: BNP (last 3 results) No results for input(s): BNP in the last 8760 hours.  ProBNP (last 3 results) No results for input(s): PROBNP in the last 8760 hours.   D-Dimer No results for input(s): DDIMER in the last 72 hours. Hemoglobin A1C No results for input(s): HGBA1C in the last 72 hours. Fasting Lipid Panel No results  for input(s): CHOL, HDL, LDLCALC, TRIG, CHOLHDL, LDLDIRECT in the last 72 hours. Thyroid Function Tests No results for input(s): TSH, T4TOTAL, T3FREE, THYROIDAB in the last 72 hours.  Invalid input(s): FREET3  Other results:   Imaging    DG Chest 1 View  Result Date: 10/10/2020 CLINICAL DATA:  Check central line placement EXAM: CHEST  1 VIEW COMPARISON:  10/04/2020 FINDINGS: Cardiac shadow is enlarged. Defibrillator is again noted and stable. Impella device is noted in satisfactory position. Swan-Ganz catheter is seen within the pulmonary outflow tract. Right-sided PICC line is noted with the catheter tip in the mid superior vena cava. Lungs are clear bilaterally. No new focal abnormality is seen.  IMPRESSION: Tubes and lines as described stable in appearance from the prior exam. Electronically Signed   By: Inez Catalina M.D.   On: 10/10/2020 03:13   DG CHEST PORT 1 VIEW  Result Date: 10/10/2020 CLINICAL DATA:  Chest pain EXAM: PORTABLE CHEST 1 VIEW COMPARISON:  October 10, 2020 study obtained earlier in the day FINDINGS: Impella device unchanged in position. Swan-Ganz catheter tip is in the main pulmonary outflow tract directed slightly toward the right. A right peripherally inserted central catheter present with tip in superior vena cava. Pacemaker lead attached to right ventricle. No pneumothorax. Lungs are clear. Heart is mildly enlarged with pulmonary vascularity normal. No adenopathy. No bone lesions. IMPRESSION: Tube and catheter positions as described without evident pneumothorax. Lungs clear. Stable cardiac prominence. Electronically Signed   By: Lowella Grip III M.D.   On: 10/10/2020 08:19     Medications:     Scheduled Medications: . (feeding supplement) PROSource Plus  30 mL Oral BID BM  . sodium chloride   Intravenous Once  . amiodarone  200 mg Oral BID  . Chlorhexidine Gluconate Cloth  6 each Topical Daily  . feeding supplement  237 mL Oral BID BM  . gabapentin  300 mg Oral TID  . insulin aspart  0-20 Units Subcutaneous TID WC  . insulin aspart  0-5 Units Subcutaneous QHS  . insulin glargine  10 Units Subcutaneous QHS  . lidocaine  1 patch Transdermal Q24H  . mouth rinse  15 mL Mouth Rinse BID  . multivitamin with minerals  1 tablet Oral Daily  . rosuvastatin  40 mg Oral Daily  . sildenafil  40 mg Oral TID  . sodium chloride flush  10-40 mL Intracatheter Q12H    Infusions: . sodium chloride 250 mL (10/09/20 1632)  . DOBUTamine 2 mcg/kg/min (10/10/20 0800)  . heparin 850 Units/hr (10/10/20 0600)  . impella catheter heparin 50 unit/mL in dextrose 5% 6.1 mL/hr at 10/05/20 0851  . lactated ringers 20 mL/hr at 10/10/20 0800  . lactated ringers      PRN  Medications: sodium chloride, acetaminophen, ALPRAZolam, aminocaproic acid, HYDROmorphone (DILAUDID) injection, ondansetron (ZOFRAN) IV, ondansetron, oxyCODONE, oxyCODONE-acetaminophen, sodium chloride flush, white petrolatum    Assessment/Plan   1. COVID-19 infection:in 1/22. Manifested primarily as nausea/vomiting. Very slow recovery, did not take meds for at least 3 wks but had recently started back on meds just prior to admission.   - Suspect that nausea/poor appetite was actually due to HF.   2.Acute on chronic systolic HF:ICM.HasMedtronic ICD. Narrow QRS, not CRT candidate. Has required milrinone multiple times in the past for low output HF. Echo 6/19 with EF 20% and moderately decreased RV systolic function.CPX 4/19 with severe functional limitation due to [facility] 6/19 showed low output and coronary angiography showed no interventional targets.Echo in 4/21  showed EF 25%, mildly decreased RV systolic function. CPX in 5/21 showed severe HF limitation. He has been resistant to LVAD/transplant evaluation, both offered with discussions in past. I worry that we will miss the window for advanced therapies. He was not interested in barostimulatoractivation therapy. History of cardiorenal syndrome/diabetic nephropathy, creatinine up to 2.33 in 1/22, likely due to dehydration with nausea/vomiting.He had been off meds for several weeks, just restarted 3-4 days PTA. Low output persisted despite milrinone and then dobutamine with rise in creatinine to 2.5. Impella 5.5 placed 3/1.  This morning, CVP 7-8 with CI 2.4. PA pressure lower now on sildenafil.  - VAD workup has been initiated, renal function is limiting factor currently.  Has had teeth out.  - Continue Impella P8, good position by echo done today (5-5.2 cm).  Continue heparin gtt. Stable LDH and plts.  - No Lasix today with CVP ideal.   - Continue dobutamine 2.  - Continue sildenafil 40 mg tid with pulmonary hypertension.  -  Off carvedilol with low output - Will hold spiro with ongoing AKI - Off empagliflozin for now with AKI  - Off Entresto with low BP  2. CAD s/p CABG2002:Last cath in 6/19 with patent grafts. No s/s ischemia  -No ASA given apixaban use.  - Continue Crestor 40 mg daily.  3. AKI on CKD Stage 3b: I suspect that this is a combination of cardiorenal syndrome and diabetic nephropathy and possibly ATN. Creatinine stable 1.8 -> 2.1 -> 2.25 -> 2.5  -> 2.4 -> 2.5 -> 2.47 -> 2.45 => 2.23 => 2.18. Need creatinine to trend down for LVAD.  - continue hemodynamic support.  4. WU:JWJXBJY of very poor control but hgbA1ctrending down. Most recent 7.7%  - Cover with SSI. 5. Atrial flutter: s/p DC-CV 08/21/18.He is in NSR currently but note runs of ?flutter vs fibrillation on device interrogation. Very frequent PACs on telemetry -> confirmed on ECG 2/25.  - Continue amiodarone 200 mg bid.   - Heparin gtt with Impella.  6. Wound R Foot: Partial thickness skin loss noted. Cover with mepilex border and change every 5 days.  7. Right arm pain: Think neuropathic, per Dr. Cyndia Bent patient had stretch of brachial plexus with Impella placement.  - Continue gabapentin.  - Lidocaine patch.  8. ID: Low grade fever around 100 last night, now afebrile.  - CXR - Panculture.   Will discuss at [employer] today, creatinine continues to slowly trend down.  Arm pain now is main issue and is frustrating him.   CRITICAL CARE Performed by: Loralie Champagne  Total critical care time: 40 minutes  Critical care time was exclusive of separately billable procedures and treating other patients.  Critical care was necessary to treat or prevent imminent or life-threatening deterioration.  Critical care was time spent personally by me on the following activities: development of treatment plan with patient and/or surrogate as well as nursing, discussions with consultants, evaluation of patient's response to treatment, examination of  patient, obtaining history from patient or surrogate, ordering and performing treatments and interventions, ordering and review of laboratory studies, ordering and review of radiographic studies, pulse oximetry and re-evaluation of patient's condition.   Length of Stay: 50  Loralie Champagne, MD  10/10/2020, 9:05 AM  Advanced Heart Failure Team Pager [phone] (M-F; [place])  Please contact [facility] for night-coverage after hours (4p -7a ) and weekends on [email or web address]

## 2020-10-10 NOTE — Progress Notes (Signed)
Inpatient Diabetes Program Recommendations  AACE/ADA: New Consensus Statement on Inpatient Glycemic Control  Target Ranges:  Prepandial:   less than 140 mg/dL      Peak postprandial:   less than 180 mg/dL (1-2 hours)      Critically ill patients:  140 - 180 mg/dL   Results for Marcus Johnson, Marcus Johnson (MRN [id number]) as of 10/10/2020 12:25  Ref. Range 10/09/2020 05:31 10/09/2020 12:01 10/09/2020 16:31 10/09/2020 21:16 10/10/2020 06:22 10/10/2020 11:10  Glucose-Capillary Latest Ref Range: 70 - 99 mg/dL 191 (H) 230 (H) 215 (H) 250 (H) 165 (H) 265 (H)   Review of Glycemic Control  Diabetes history: DM2 Outpatient Diabetes medications: Jardiance 10 mg daily, Novolog 5 units TID with meals, Lantus 50 units QHS Current orders for Inpatient glycemic control: Lantus 10 units QHS, Novolog 0-20 units TID with meals, Novolog 0-5 units QHS  Inpatient Diabetes Program Recommendations:    Insulin: Please consider ordering Novolog 4 units TID with meals for meal coverage if patient eats at least 50% of meals.  Thanks, Barnie Alderman, RN, MSN, CDE Diabetes Coordinator Inpatient Diabetes Program [phone] (Team Pager from 8am to 5pm)

## 2020-10-10 NOTE — Progress Notes (Signed)
PT Cancellation Note  Patient Details Name: Marcus Johnson. MRN: [id number] DOB: 1961/06/09   Cancelled Treatment:    Reason Eval/Treat Not Completed: Other (comment) (pt returning to room from walking with RN)   [name] Aliya Sol 10/10/2020, 8:24 AM  [place] Pager: [phone] Office: [phone]

## 2020-10-10 NOTE — Progress Notes (Signed)
CSW spoke with patient's brother [name] via phone to discuss caregiver role and availability. Patient and brother reside in the same household. [name] reports he is retired and available to be at home with patient for the 24/7 time period and any needs for patient. Patient's son also resides in the home although he works full time outside of the home. CSW reviewed the caregiver role and he denies any concerns with fulfilling the role.   Caregiver questions Please explain what you hope will be improved about your life and loved one's life as a result of receiving the LVAD? "Hope for less pain and live longer" What is your biggest concern or fear about caregiving with an LVAD patient? Brother denies any concerns  What is your plan for availability to provide care 24/7 x2 weeks post op and dressing changes ongoing? Brother states he is home and denies any concerns with dressing changes.  Who is the relief/backup caregiver and what is their availability? Patient's son is available as the back up and works a full time job but available during off hours.   Preferred method of learning? Other:  combo   Do you drive? yes How do you handle stressful situations? pray Do you think you can do this? Yes    Is there anything that concerns about caregiving? no  Do you provide caregiving to anyone else? no  Caregiver's current level of motivation to prepare for LVAD: Very motivated for improved health for his brother Caregiver's present level of consent for LVAD: Ready  Patient appears to have a supportive brother and son who will provide the care needed for VAD life. CSW recommends patient for VAD implantation.  Raquel Sarna, [place], [employer]

## 2020-10-11 DIAGNOSIS — R570 Cardiogenic shock: Secondary | ICD-10-CM | POA: Diagnosis not present

## 2020-10-11 DIAGNOSIS — I5023 Acute on chronic systolic (congestive) heart failure: Secondary | ICD-10-CM | POA: Diagnosis not present

## 2020-10-11 LAB — HEPARIN LEVEL (UNFRACTIONATED): Heparin Unfractionated: 0.36 IU/mL (ref 0.30–0.70)

## 2020-10-11 LAB — CBC
HCT: 30.1 % — ABNORMAL LOW (ref 39.0–52.0)
Hemoglobin: 9.1 g/dL — ABNORMAL LOW (ref 13.0–17.0)
MCH: 25 pg — ABNORMAL LOW (ref 26.0–34.0)
MCHC: 30.2 g/dL (ref 30.0–36.0)
MCV: 82.7 fL (ref 80.0–100.0)
Platelets: 192 10*3/uL (ref 150–400)
RBC: 3.64 MIL/uL — ABNORMAL LOW (ref 4.22–5.81)
RDW: 19.3 % — ABNORMAL HIGH (ref 11.5–15.5)
WBC: 14 10*3/uL — ABNORMAL HIGH (ref 4.0–10.5)
nRBC: 0.1 % (ref 0.0–0.2)

## 2020-10-11 LAB — COOXEMETRY PANEL
Carboxyhemoglobin: 2.2 % — ABNORMAL HIGH (ref 0.5–1.5)
Methemoglobin: 0.9 % (ref 0.0–1.5)
O2 Saturation: 61.9 %
Total hemoglobin: 9.3 g/dL — ABNORMAL LOW (ref 12.0–16.0)

## 2020-10-11 LAB — BASIC METABOLIC PANEL
Anion gap: 8 (ref 5–15)
BUN: 29 mg/dL — ABNORMAL HIGH (ref 6–20)
CO2: 25 mmol/L (ref 22–32)
Calcium: 8.7 mg/dL — ABNORMAL LOW (ref 8.9–10.3)
Chloride: 101 mmol/L (ref 98–111)
Creatinine, Ser: 1.84 mg/dL — ABNORMAL HIGH (ref 0.61–1.24)
GFR, Estimated: 42 mL/min — ABNORMAL LOW (ref >60)
Glucose, Bld: 186 mg/dL — ABNORMAL HIGH (ref 70–99)
Potassium: 3.8 mmol/L (ref 3.5–5.1)
Sodium: 134 mmol/L — ABNORMAL LOW (ref 135–145)

## 2020-10-11 LAB — GLUCOSE, CAPILLARY
Glucose-Capillary: 170 mg/dL — ABNORMAL HIGH (ref 70–99)
Glucose-Capillary: 212 mg/dL — ABNORMAL HIGH (ref 70–99)
Glucose-Capillary: 269 mg/dL — ABNORMAL HIGH (ref 70–99)
Glucose-Capillary: 259 mg/dL — ABNORMAL HIGH (ref 70–99)

## 2020-10-11 LAB — LACTATE DEHYDROGENASE: LDH: 344 U/L — ABNORMAL HIGH (ref 98–192)

## 2020-10-11 LAB — URINE CULTURE: Culture: NO GROWTH

## 2020-10-11 LAB — APTT: aPTT: 45 seconds — ABNORMAL HIGH (ref 24–36)

## 2020-10-11 MED ORDER — BISACODYL 5 MG PO TBEC
5.0000 mg | DELAYED_RELEASE_TABLET | Freq: Every day | ORAL | Status: DC | PRN
  Administered 2020-10-11: 5 mg via ORAL
  Filled 2020-10-11: qty 1

## 2020-10-11 MED ORDER — POTASSIUM CHLORIDE CRYS ER 20 MEQ PO TBCR
40.0000 meq | EXTENDED_RELEASE_TABLET | Freq: Once | ORAL | Status: AC
  Administered 2020-10-11: 40 meq via ORAL
  Filled 2020-10-11: qty 2

## 2020-10-11 MED ORDER — DOCUSATE SODIUM 100 MG PO CAPS
100.0000 mg | ORAL_CAPSULE | Freq: Two times a day (BID) | ORAL | Status: DC
Start: 2020-10-11 — End: 2020-10-13
  Administered 2020-10-11 (×2): 100 mg via ORAL
  Filled 2020-10-11 (×2): qty 1

## 2020-10-11 MED ORDER — POLYETHYLENE GLYCOL 3350 17 G PO PACK
17.0000 g | PACK | Freq: Every day | ORAL | Status: DC | PRN
  Administered 2020-10-11: 17 g via ORAL
  Filled 2020-10-11: qty 1

## 2020-10-11 NOTE — Progress Notes (Signed)
Physical Therapy Treatment Patient Details Name: Marcus Johnson. MRN: [id number] DOB: 05/08/1961 Today's Date: 10/11/2020    History of Present Illness 60 y.o. male admitted 2/28 with ICM for LVAD workup. Impella placed 3/1. PMHx: DM2, CAD s/p CABG 9211, systolic HF due to ischemic cardiomyopathy with EF 20-25% (echo 12/15), DM2 and CKD. He is s/p Medtronic single chamber ICD. Covid 1/22    PT Comments    Pt very pleasant and willing to walk but reports abdominal pain from constipation. After walking 10' pt stated need to void with pt able to back up to [facility] with successful BM. Pt then able to walk the unit, perform repeated sit to stands and bil LE HEP. Pt educated for precautions and hand placement post op with continued HEP and ambulation encouraged. Will continue to follow. RN present throughout for impella and Swan ganz  HR 97 100% on RA impella at p 8 BP 105/78 (86)   Follow Up Recommendations  Home health PT;Supervision for mobility/OOB     Equipment Recommendations  Other (comment) (TBD post LVAD)    Recommendations for Other Services       Precautions / Restrictions Precautions Precautions: Fall Precaution Comments: swan-ganz, impella Restrictions RUE Weight Bearing: Partial weight bearing    Mobility  Bed Mobility Overal bed mobility: Needs Assistance Bed Mobility: Supine to Sit;Sit to Supine     Supine to sit: HOB elevated     General bed mobility comments: HOB 30 degrees with min assist to clear trunk from surface and scoot to EOB. Min assist to fully clear legs back onto surface with return to bed    Transfers Overall transfer level: Needs assistance   Transfers: Sit to/from Stand Sit to Stand: Min guard         General transfer comment: cues for hand placement and safety. Pt able to stand from bed and BSC with 10 repeated sit to stands at bed after gait  Ambulation/Gait Ambulation/Gait assistance: Min assist Gait Distance (Feet): 400  Feet Assistive device: 1 person hand held assist Gait Pattern/deviations: Step-through pattern;Decreased stride length   Gait velocity interpretation: >2.62 ft/sec, indicative of community ambulatory General Gait Details: initially utilized RW but pt veering left due to decreased strength in RUE and transitioned to LUE HHA after 100' with improved speed and stability with total of +3 assist for lines and safety, chair followed but not needed   Stairs             Wheelchair Mobility    Modified Rankin (Stroke Patients Only)       Balance Overall balance assessment: Mild deficits observed, not formally tested                                          Cognition Arousal/Alertness: Awake/alert Behavior During Therapy: WFL for tasks assessed/performed Overall Cognitive Status: Within Functional Limits for tasks assessed                                        Exercises General Exercises - Lower Extremity Long Arc Quad: AROM;Both;Seated;15 reps Hip Flexion/Marching: AROM;Both;15 reps;Seated    General Comments        Pertinent Vitals/Pain Pain Assessment: 0-10 Pain Score: 4  Pain Location: RUE Pain Descriptors / Indicators: Aching;Guarding Pain Intervention(s): Limited activity  within patient's tolerance;Monitored during session;Repositioned    Home Living                      Prior Function            PT Goals (current goals can now be found in the care plan section) Progress towards PT goals: Progressing toward goals    Frequency    Min 3X/week      PT Plan Current plan remains appropriate    Co-evaluation              AM-PAC PT "6 Clicks" Mobility   Outcome Measure  Help needed turning from your back to your side while in a flat bed without using bedrails?: A Little Help needed moving from lying on your back to sitting on the side of a flat bed without using bedrails?: A Little Help needed moving to  and from a bed to a chair (including a wheelchair)?: A Little Help needed standing up from a chair using your arms (e.g., wheelchair or bedside chair)?: A Little Help needed to walk in hospital room?: A Little Help needed climbing 3-5 steps with a railing? : A Lot 6 Click Score: 17    End of Session   Activity Tolerance: Patient tolerated treatment well Patient left: in bed;with call bell/phone within reach;with nursing/sitter in room Nurse Communication: Mobility status PT Visit Diagnosis: Unsteadiness on feet (R26.81);Other abnormalities of gait and mobility (R26.89);Muscle weakness (generalized) (M62.81)     Time: 2263-3354 PT Time Calculation (min) (ACUTE ONLY): 44 min  Charges:  $Gait Training: 8-22 mins $Therapeutic Exercise: 8-22 mins $Therapeutic Activity: 8-22 mins                     Marcus Johnson P, PT Acute Rehabilitation Services Pager: [phone] Office: [place] 10/11/2020, 9:35 AM

## 2020-10-11 NOTE — TOC Initial Note (Signed)
Transition of Care [facility]) - Initial/Assessment Note    Patient Details  Name: Marcus Johnson. MRN: [id number] Date of Birth: 31-Jul-1961  Transition of Care [facility]) CM/SW Contact:    Bethena Roys, RN Phone Number: 10/11/2020, 3:42 PM  Clinical Narrative: Patient presented for acute decompensated heart failure. Prior to arrival patient was from home with the support of his son Ulises Wolfinger and the patient's brother [name]. Plan is for LVAD on 10-13-20.  Patient has been ambulating well with Physical Therapy and the recommendations are for home health services. Case Manager discussed options with the patient. The patient wants to see how he progresses to see what services he will benefit from. Patient has stated to call his son Derris Millan for agency choice for home health services as he progresses and it gets closer to discharge.                 Expected Discharge Plan: [place] Barriers to Discharge: Continued Medical Work up (Plan LVAD Thursday)   Patient Goals and CMS Choice Patient states their goals for this hospitalization and ongoing recovery are:: he is praying that the procedure goes well, so that he can return home.      Expected Discharge Plan and Services Expected Discharge Plan: [place]   Discharge Planning Services: CM Consult Post Acute Care Choice: Home Health  Prior Living Arrangements/Services   Lives with:: [place] Patient language and need for interpreter reviewed:: Yes Do you feel safe going back to the place where you live?: Yes      Need for Family Participation in Patient Care: Yes (Comment) Care giver support system in place?: Yes (comment)   Criminal Activity/Legal Involvement Pertinent to Current Situation/Hospitalization: No - Comment as needed  Activities of Daily Living Home Assistive Devices/Equipment: None ADL Screening (condition at time of admission) Patient's cognitive  ability adequate to safely complete daily activities?: Yes Is the patient deaf or have difficulty hearing?: No Does the patient have difficulty seeing, even when wearing glasses/contacts?: No Does the patient have difficulty concentrating, remembering, or making decisions?: No Patient able to express need for assistance with ADLs?: Yes Does the patient have difficulty dressing or bathing?: No Independently performs ADLs?: Yes (appropriate for developmental age) Does the patient have difficulty walking or climbing stairs?: Yes Weakness of Legs: Both Weakness of Arms/Hands: None  Permission Sought/Granted Permission sought to share information with : Family Nature conservation officer Permission granted to share information with : Yes, Verbal Permission Granted        Permission granted to share info w Relationship: Gutterman,[place] [phone]     Emotional Assessment Appearance:: Appears stated age Attitude/Demeanor/Rapport: Engaged Affect (typically observed): Appropriate Orientation: : Oriented to Situation,Oriented to  Time,Oriented to Self,Oriented to Place Alcohol / Substance Use: Not Applicable Psych Involvement: No (comment)  Admission diagnosis:  Acute on chronic systolic heart failure ([place]) [I50.23] Patient Active Problem List   Diagnosis Date Noted  . Dental caries   . Loss of teeth due to extraction   . Acute on chronic systolic heart failure ([place]) 09/28/2020  . Osteomyelitis of metatarsal ([place]) 08/11/2018  . Systolic CHF ([place]) 40/81/4481  . Acute on chronic systolic (congestive) heart failure ([place]) 01/07/2018  . CKD (chronic kidney disease), stage III ([place]) 12/20/2017  . Chronic combined systolic and diastolic congestive heart failure ([place]) 04/16/2016  . Generalized weakness 04/15/2016  . Acute renal failure superimposed on stage 3 chronic kidney disease ([place]) 04/15/2016  .  Dehydration   . Non compliance with medical treatment   . Obesity (BMI 30-39.9) 07/21/2014  .  Cardiomyopathy, ischemic 07/21/2014  . Coronary artery disease involving native coronary artery of native heart without angina pectoris   . Lower extremity edema 07/19/2014  . DM (diabetes mellitus) type II controlled with renal manifestation ([place]) 07/19/2014  . CAP (community acquired pneumonia) 10/21/2013  . Dyspnea 10/19/2013  . Automatic implantable cardioverter-defibrillator  Medtronic 01/08/2012  . PLANTAR FASCIITIS 10/08/2008  . CARBUNCLE AND FURUNCLE OF TRUNK 08/09/2008  . HLD (hyperlipidemia) 01/09/2008  . HTN (hypertension) 01/09/2008  . MYOCARDIAL INFARCTION, HX OF 01/09/2008  . Ischemic cardiomyopathy 01/09/2008   PCP:  [name], MD Pharmacy:   [facility], [place] [address] [place] [address] Phone: [phone] Fax: [phone]  Readmission Risk Interventions No flowsheet data found.

## 2020-10-11 NOTE — Progress Notes (Signed)
Shift Summary: Impella remains on p8, walked around unit with RN & PT, PAC and foley removed.   N: AAOx4, afebrile, MAE, c/o R arm pain, weakness, and neuropathy r/t impella placement. Pain managed with prn acetaminophen, pt refuses narcotics.   R: RA, clear lungs  CV: NSR with frequent PAC and rare PVC. AICD on. Swan unable to flush or draw, removed. CVP switched to PICC line and reading 17-20. Impella remains on p8, flow 4.0-4.3 without alarms or complications. Dobutamine and levophed remain on, no titrations made. Heparin remains on per pharmacy.   GI: Advanced diet from clear liquid to cardiac and sodium controlled. Medium type 1 stool today, bowel regimen given.   GU: foley removed for infection prevention prior to surgery. Adequate clear/yellow UOP with foley in place. Awaiting post-removal void.   Skin intact, no new issues. K+ replaced PO. MD notified of change in co-ox this AM.   No visitors though patient on cell phone with support system throughout day. Plan for VAD implantation 3/10.   Chelsei Mcchesney RN

## 2020-10-11 NOTE — Progress Notes (Addendum)
Patient ID: Marcus Mcmath., male   DOB: 1960-09-16, 60 y.o.   MRN:      Advanced Heart Failure Rounding Note  PCP-Cardiologist: No primary care provider on file.   Subjective:    2/27 Milrinone switched to dobutamine 5 mcg.  2/28 Moved to ICU. Dobutamine increased to 7.5 mcg and Norepi 3 mcg added. Diuretics held.  3/1 Impella 5.5 placed.  3/4 Teeth removed  Creatinine 2.45 => 2.23 => 2.18 => 1.84. Remains on dobutamine 2 + NE 3. Good UOP yesterday, weight down a lb.   Right arm pain more controlled this morning.   He has been walking in the hall.  [place] numbers: CVP 9-10 PA waveform is not accurate (catheter in good position, not wedged by CXR) CI 3.6 Co-ox 62%  Impella 5.5: P8 Flow 4.1 No alarms LDH 384 => 395 => 350 => 328 => 344 Hgb 10.3 => 10.3 => 9 => 8.3 => 9.1 plts 188 => 159 => 166 => 166 => 192  Echo: EF 20-25%, normal RV, PASP 61, dilated IVC.    Objective:   Weight Range: 91.4 kg Body mass index is 28.5 kg/m.   Vital Signs:   Temp:  [98.06 F (36.7 C)-99.5 F (37.5 C)] 99.1 F (37.3 C) (03/08 0400) Pulse Rate:  [85-101] 87 (03/08 0400) Resp:  [9-23] 18 (03/08 0400) BP: (85-149)/(67-117) 120/73 (03/08 0300) SpO2:  [97 %-100 %] 97 % (03/08 0400) Last BM Date: 10/04/20  Weight change: Filed Weights   10/08/20 1500 10/09/20 0600 10/10/20 0600  Weight: 91.2 kg 91.9 kg 91.4 kg    Intake/Output:   Intake/Output Summary (Last 24 hours) at 10/11/2020 0732 Last data filed at 10/11/2020 0600 Gross per 24 hour  Intake 912.05 ml  Output 2025 ml  Net -1112.95 ml      Physical Exam  CVP 9-10.  General: NAD Neck: JVP 8 cm, no thyromegaly or thyroid nodule.  Lungs: Clear to auscultation bilaterally with normal respiratory effort. CV: Nondisplaced PMI.  Heart regular S1/S2, no S3/S4, no murmur.  No peripheral edema.  Abdomen: Soft, nontender, no hepatosplenomegaly, no distention.  Skin: Intact without lesions or rashes.  Neurologic: Alert  and oriented x 3.  Psych: Normal affect. Extremities: No clubbing or cyanosis.  HEENT: Normal.    Telemetry  ?NSR 90s with PACs/PVCs (personally reviewed)  Labs    CBC Recent Labs    10/10/20 0224 10/11/20 0506  WBC 11.3* 14.0*  HGB 8.5* 9.1*  HCT 28.1* 30.1*  MCV 82.6 82.7  PLT 166 161   Basic Metabolic Panel Recent Labs    10/10/20 0224 10/11/20 0506  NA 135 134*  K 4.1 3.8  CL 99 101  CO2 28 25  GLUCOSE 216* 186*  BUN 41* 29*  CREATININE 2.18* 1.84*  CALCIUM 8.8* 8.7*   Liver Function Tests No results for input(s): AST, ALT, ALKPHOS, BILITOT, PROT, ALBUMIN in the last 72 hours. No results for input(s): LIPASE, AMYLASE in the last 72 hours. Cardiac Enzymes No results for input(s): CKTOTAL, CKMB, CKMBINDEX, TROPONINI in the last 72 hours.  BNP: BNP (last 3 results) No results for input(s): BNP in the last 8760 hours.  ProBNP (last 3 results) No results for input(s): PROBNP in the last 8760 hours.   D-Dimer No results for input(s): DDIMER in the last 72 hours. Hemoglobin A1C No results for input(s): HGBA1C in the last 72 hours. Fasting Lipid Panel No results for input(s): CHOL, HDL, LDLCALC, TRIG, CHOLHDL, LDLDIRECT in the  last 72 hours. Thyroid Function Tests No results for input(s): TSH, T4TOTAL, T3FREE, THYROIDAB in the last 72 hours.  Invalid input(s): FREET3  Other results:   Imaging    DG CHEST PORT 1 VIEW  Result Date: 10/10/2020 CLINICAL DATA:  Chest pain EXAM: PORTABLE CHEST 1 VIEW COMPARISON:  October 10, 2020 study obtained earlier in the day FINDINGS: Impella device unchanged in position. Swan-Ganz catheter tip is in the main pulmonary outflow tract directed slightly toward the right. A right peripherally inserted central catheter present with tip in superior vena cava. Pacemaker lead attached to right ventricle. No pneumothorax. Lungs are clear. Heart is mildly enlarged with pulmonary vascularity normal. No adenopathy. No bone lesions.  IMPRESSION: Tube and catheter positions as described without evident pneumothorax. Lungs clear. Stable cardiac prominence. Electronically Signed   By: Lowella Grip III M.D.   On: 10/10/2020 08:19   ECHOCARDIOGRAM LIMITED  Result Date: 10/10/2020    ECHOCARDIOGRAM LIMITED REPORT   Patient Name:   Marcus Johnson. Date of Exam: 10/10/2020 Medical Rec #:  [id number]                Height:       70.5 in Accession #:    [id number]               Weight:       201.5 lb Date of Birth:  Jul 13, 1961                BSA:          2.105 m Patient Age:    81 years                 BP:           123/105 mmHg Patient Gender: M                        HR:           93 bpm. Exam Location:  Inpatient Procedure: Limited Echo Indications:    CHF  History:        Patient has prior history of Echocardiogram examinations, most                 recent 10/05/2020. Cardiomyopathy and CHF, CAD, Prior CABG,                 Arrythmias:Atrial Flutter; Risk Factors:Diabetes.  Sonographer:    Dustin Flock Referring Phys: [place] Comments: Impella position only  FINDINGS  Aortic Valve: An impella 5.5 is visualized and measures 5.2cm from the aortic annulus. Gwyndolyn Kaufman MD Electronically signed by Gwyndolyn Kaufman MD Signature Date/Time: 10/10/2020/12:55:34 PM    Final      Medications:     Scheduled Medications: . (feeding supplement) PROSource Plus  30 mL Oral BID BM  . sodium chloride   Intravenous Once  . amiodarone  200 mg Oral BID  . Chlorhexidine Gluconate Cloth  6 each Topical Daily  . docusate sodium  100 mg Oral BID  . feeding supplement  237 mL Oral BID BM  . gabapentin  300 mg Oral TID  . insulin aspart  0-20 Units Subcutaneous TID WC  . insulin aspart  0-5 Units Subcutaneous QHS  . insulin glargine  10 Units Subcutaneous QHS  . lidocaine  1 patch Transdermal Q24H  . mouth rinse  15 mL Mouth Rinse BID  . multivitamin with minerals  1 tablet  Oral Daily  . rosuvastatin  40 mg  Oral Daily  . sildenafil  40 mg Oral TID  . sodium chloride flush  10-40 mL Intracatheter Q12H    Infusions: . sodium chloride 250 mL (10/09/20 1632)  . DOBUTamine 2 mcg/kg/min (10/11/20 0400)  . heparin 900 Units/hr (10/11/20 0000)  . impella catheter heparin 50 unit/mL in dextrose 5% 6.1 mL/hr at 10/05/20 0851  . lactated ringers 20 mL/hr at 10/11/20 0400  . lactated ringers    . norepinephrine (LEVOPHED) Adult infusion 3 mcg/min (10/11/20 0400)    PRN Medications: sodium chloride, acetaminophen, ALPRAZolam, aminocaproic acid, bisacodyl, HYDROmorphone (DILAUDID) injection, ondansetron (ZOFRAN) IV, ondansetron, oxyCODONE, oxyCODONE-acetaminophen, polyethylene glycol, sodium chloride flush, white petrolatum    Assessment/Plan   1. COVID-19 infection:in 1/22. Manifested primarily as nausea/vomiting. Very slow recovery, did not take meds for at least 3 wks but had recently started back on meds just prior to admission.   - Suspect that nausea/poor appetite was actually due to HF.   2.Acute on chronic systolic HF:ICM.HasMedtronic ICD. Narrow QRS, not CRT candidate. Has required milrinone multiple times in the past for low output HF. Echo 6/19 with EF 20% and moderately decreased RV systolic function.CPX 4/19 with severe functional limitation due to [facility] 6/19 showed low output and coronary angiography showed no interventional targets.Echo in 4/21 showed EF 25%, mildly decreased RV systolic function. CPX in 5/21 showed severe HF limitation. He has been resistant to LVAD/transplant evaluation, both offered with discussions in past. I worry that we will miss the window for advanced therapies. He was not interested in barostimulatoractivation therapy. History of cardiorenal syndrome/diabetic nephropathy, creatinine up to 2.33 in 1/22, likely due to dehydration with nausea/vomiting.He had been off meds for several weeks, just restarted 3-4 days PTA. Admitted with NYHA class IV  symptoms.  Low output persisted despite milrinone and then dobutamine with rise in creatinine to 2.5. Impella 5.5 placed 3/1.  He is currently on dobutamine 2 + NE 3. This morning, CVP 8-9 with CI 3.6. PA pressure lower now on sildenafil.  Creatinine down to 1.84. - VAD workup has been initiated, renal function has been limiting factor, now improved.  Has had teeth out.  Plan for LVAD Thursday.  - Continue Impella P8, good position by echo done 3/7 (5-5.2 cm).  Continue heparin gtt. Stable LDH and plts.  - No Lasix today with CVP 9-10 and good UOP.   - Continue dobutamine 2 + NE 3.  - Continue sildenafil 40 mg tid with pulmonary hypertension.  - Off carvedilol with low output - Will hold spiro with ongoing AKI - Off empagliflozin for now with AKI  - Off Entresto with low BP  2. CAD s/p CABG2002:Last cath in 6/19 with patent grafts. No s/s ischemia  -No ASA given apixaban use.  - Continue Crestor 40 mg daily.  3. AKI on CKD Stage 3b: I suspect that this is a combination of cardiorenal syndrome and diabetic nephropathy and possibly ATN. Creatinine now at 1.84 which is near his baseline.  - continue hemodynamic support.  4. XA:JOINOMV of very poor control but hgbA1ctrending down. Most recent 7.7%  - Cover with SSI. 5. Atrial flutter: s/p DC-CV 08/21/18.He is in NSR currently but note runs of ?flutter vs fibrillation on device interrogation. Very frequent PACs on telemetry -> confirmed on ECG 2/25.  - Continue amiodarone 200 mg bid.   - Heparin gtt with Impella.  6. Wound R Foot: Partial thickness skin loss noted. Cover with mepilex border  and change every 5 days.  7. Right arm pain: Think neuropathic, per Dr. Cyndia Bent patient had stretch of brachial plexus with Impella placement.  - Continue gabapentin.  - Lidocaine patch.  8. ID: Afebrile, negative cultures.    CRITICAL CARE Performed by: Loralie Champagne  Total critical care time: 40 minutes  Critical care time was exclusive of  separately billable procedures and treating other patients.  Critical care was necessary to treat or prevent imminent or life-threatening deterioration.  Critical care was time spent personally by me on the following activities: development of treatment plan with patient and/or surrogate as well as nursing, discussions with consultants, evaluation of patient's response to treatment, examination of patient, obtaining history from patient or surrogate, ordering and performing treatments and interventions, ordering and review of laboratory studies, ordering and review of radiographic studies, pulse oximetry and re-evaluation of patient's condition.   Length of Stay: 40  Loralie Champagne, MD  10/11/2020, 7:32 AM  Advanced Heart Failure Team Pager [phone] (M-F; 7a - 4p)  Please contact [facility] for night-coverage after hours (4p -7a ) and weekends on [email or web address]

## 2020-10-11 NOTE — Progress Notes (Signed)
Inpatient Diabetes Program Recommendations  AACE/ADA: New Consensus Statement on Inpatient Glycemic Control   Target Ranges:  Prepandial:   less than 140 mg/dL      Peak postprandial:   less than 180 mg/dL (1-2 hours)      Critically ill patients:  140 - 180 mg/dL   Results for JIMI, SCHAPPERT (MRN [id number]) as of 10/11/2020 09:48  Ref. Range 10/10/2020 06:22 10/10/2020 11:10 10/10/2020 15:07 10/10/2020 21:38 10/11/2020 06:54  Glucose-Capillary Latest Ref Range: 70 - 99 mg/dL 165 (H)  Novolog 4 units 265 (H)  Novolog 11 units 194 (H)  Novolog 4 units 181 (H)     Lantus 10 units 170 (H)  Novolog 4 units   Review of Glycemic Control  Diabetes history: DM2 Outpatient Diabetes medications: Jardiance 10 mg daily, Novolog 5 units TID with meals, Lantus 50 units QHS Current orders for Inpatient glycemic control: Lantus 10 units QHS, Novolog 0-20 units TID with meals, Novolog 0-5 units QHS  Inpatient Diabetes Program Recommendations:    Insulin: Please consider ordering Novolog 4 units TID with meals for meal coverage if patient eats at least 50% of meals.  Thanks, Barnie Alderman, RN, MSN, CDE Diabetes Coordinator Inpatient Diabetes Program [phone] (Team Pager from 8am to 5pm)

## 2020-10-11 NOTE — Progress Notes (Signed)
CARDIAC REHAB PHASE I   Checked in on pt. Pt states he is all in for surgery. States discussions he has been having with his son, and wants to give surgery a shot. Provided pt with listening ear, support and comfort. Will f/u tomorrow at pts request.   [phone] Rufina Falco, RN BSN 10/11/2020 3:18 PM

## 2020-10-11 NOTE — Progress Notes (Addendum)
[place] for heparin Indication: atrial fibrillation  Allergies  Allergen Reactions  . Meclizine Hcl Anaphylaxis and Swelling  . Ivabradine Nausea Only    Patient Measurements: Height: 5' 10.5" (179.1 cm) Weight: 91.4 kg (201 lb 8 oz) IBW/kg (Calculated) : 74.15 Heparin Dosing Weight: 88.5 kg   Vital Signs: Temp: 99.1 F (37.3 C) (03/08 0400) BP: 120/73 (03/08 0300) Pulse Rate: 87 (03/08 0400)  Labs: Recent Labs    10/09/20 0300 10/09/20 0734 10/10/20 0224 10/10/20 1435 10/11/20 0506  HGB 9.0*  --  8.5*  --  9.1*  HCT 31.0*  --  28.1*  --  30.1*  PLT 166  --  166  --  192  APTT 63*  --  57* 69* 45*  HEPARINUNFRC 0.52  --  0.32  --  0.36  CREATININE  --  2.15* 2.18*  --  1.84*    Estimated Creatinine Clearance: 49.6 mL/min (A) (by C-G formula based on SCr of 1.84 mg/dL (H)).   Assessment: 39 yom presenting with volume overload with low output HF - plan for impella placement while undergoing LVAD w/u. On apixaban PTA for hx Afib - LD 2/27@2144 .  Patient s/p Impella placement on 3/1 and started on heparin purge (50 units/mL). He is now s/p tooth extraction 3/4 and heparin has been resumed.   Patient was last given apixaban on 2/27. At this point, Eliquis should be cleared especially since renal function improving and patient diuresing. Heparin level is therapeutic today at 0.36. Will dose based off HL moving forward.  Impella remains at P8. Pressures stable in 500s. Heparin purge running at 6 ml/hr (300 units/hr). Hgb low but stable 9.1, pltc ok. No overt bleeding or infusion issues per discussion with nursing.   Goal of Therapy:  Heparin level 0.3-0.5 units/ml Monitor platelets by anticoagulation protocol: Yes   Plan:  -Continue systemic heparin to 900 units/hr -Monitor daily aPTT, HL, CBC, s/sx bleeding  [name], PharmD, BCPS PGY2 Cardiology Pharmacy Resident Phone: [phone] 10/11/2020  7:31 AM  Please check  [email or web address] for unit-specific pharmacy phone numbers.

## 2020-10-11 NOTE — Plan of Care (Signed)
  Problem: Education: Goal: Knowledge of General Education information will improve Description: Including pain rating scale, medication(s)/side effects and non-pharmacologic comfort measures Outcome: Progressing   Problem: Health Behavior/Discharge Planning: Goal: Ability to manage health-related needs will improve Outcome: Progressing   Problem: Clinical Measurements: Goal: Ability to maintain clinical measurements within normal limits will improve Outcome: Progressing Goal: Will remain free from infection Outcome: Progressing Goal: Diagnostic test results will improve Outcome: Progressing Goal: Cardiovascular complication will be avoided Outcome: Progressing   Problem: Activity: Goal: Risk for activity intolerance will decrease Outcome: Progressing   

## 2020-10-12 DIAGNOSIS — I5023 Acute on chronic systolic (congestive) heart failure: Secondary | ICD-10-CM | POA: Diagnosis not present

## 2020-10-12 DIAGNOSIS — Z95811 Presence of heart assist device: Secondary | ICD-10-CM | POA: Diagnosis not present

## 2020-10-12 LAB — BASIC METABOLIC PANEL
Anion gap: 7 (ref 5–15)
Anion gap: 7 (ref 5–15)
BUN: 26 mg/dL — ABNORMAL HIGH (ref 6–20)
BUN: 29 mg/dL — ABNORMAL HIGH (ref 6–20)
CO2: 25 mmol/L (ref 22–32)
CO2: 27 mmol/L (ref 22–32)
Calcium: 8.6 mg/dL — ABNORMAL LOW (ref 8.9–10.3)
Calcium: 8.6 mg/dL — ABNORMAL LOW (ref 8.9–10.3)
Chloride: 103 mmol/L (ref 98–111)
Chloride: 101 mmol/L (ref 98–111)
Creatinine, Ser: 1.73 mg/dL — ABNORMAL HIGH (ref 0.61–1.24)
Creatinine, Ser: 1.81 mg/dL — ABNORMAL HIGH (ref 0.61–1.24)
GFR, Estimated: 45 mL/min — ABNORMAL LOW (ref >60)
GFR, Estimated: 43 mL/min — ABNORMAL LOW (ref >60)
Glucose, Bld: 200 mg/dL — ABNORMAL HIGH (ref 70–99)
Glucose, Bld: 268 mg/dL — ABNORMAL HIGH (ref 70–99)
Potassium: 4 mmol/L (ref 3.5–5.1)
Potassium: 3.8 mmol/L (ref 3.5–5.1)
Sodium: 135 mmol/L (ref 135–145)
Sodium: 135 mmol/L (ref 135–145)

## 2020-10-12 LAB — URINALYSIS, ROUTINE W REFLEX MICROSCOPIC
Bacteria, UA: NONE SEEN
Bilirubin Urine: NEGATIVE
Glucose, UA: 150 mg/dL — AB
Hgb urine dipstick: NEGATIVE
Ketones, ur: NEGATIVE mg/dL
Leukocytes,Ua: NEGATIVE
Nitrite: NEGATIVE
Protein, ur: 100 mg/dL — AB
Specific Gravity, Urine: 1.014 (ref 1.005–1.030)
pH: 5 (ref 5.0–8.0)

## 2020-10-12 LAB — GLUCOSE, CAPILLARY
Glucose-Capillary: 190 mg/dL — ABNORMAL HIGH (ref 70–99)
Glucose-Capillary: 226 mg/dL — ABNORMAL HIGH (ref 70–99)
Glucose-Capillary: 244 mg/dL — ABNORMAL HIGH (ref 70–99)
Glucose-Capillary: 246 mg/dL — ABNORMAL HIGH (ref 70–99)

## 2020-10-12 LAB — APTT
aPTT: >200 seconds (ref 24–36)
aPTT: 60 seconds — ABNORMAL HIGH (ref 24–36)

## 2020-10-12 LAB — CBC
HCT: 28.5 % — ABNORMAL LOW (ref 39.0–52.0)
Hemoglobin: 8.6 g/dL — ABNORMAL LOW (ref 13.0–17.0)
MCH: 25.1 pg — ABNORMAL LOW (ref 26.0–34.0)
MCHC: 30.2 g/dL (ref 30.0–36.0)
MCV: 83.1 fL (ref 80.0–100.0)
Platelets: 189 10*3/uL (ref 150–400)
RBC: 3.43 MIL/uL — ABNORMAL LOW (ref 4.22–5.81)
RDW: 19.2 % — ABNORMAL HIGH (ref 11.5–15.5)
WBC: 13.3 10*3/uL — ABNORMAL HIGH (ref 4.0–10.5)
nRBC: 0 % (ref 0.0–0.2)

## 2020-10-12 LAB — COMPREHENSIVE METABOLIC PANEL
ALT: 17 U/L (ref 0–44)
AST: 31 U/L (ref 15–41)
Albumin: 2.5 g/dL — ABNORMAL LOW (ref 3.5–5.0)
Alkaline Phosphatase: 136 U/L — ABNORMAL HIGH (ref 38–126)
Anion gap: 8 (ref 5–15)
BUN: 30 mg/dL — ABNORMAL HIGH (ref 6–20)
CO2: 27 mmol/L (ref 22–32)
Calcium: 8.6 mg/dL — ABNORMAL LOW (ref 8.9–10.3)
Chloride: 99 mmol/L (ref 98–111)
Creatinine, Ser: 1.83 mg/dL — ABNORMAL HIGH (ref 0.61–1.24)
GFR, Estimated: 42 mL/min — ABNORMAL LOW (ref >60)
Glucose, Bld: 262 mg/dL — ABNORMAL HIGH (ref 70–99)
Potassium: 3.7 mmol/L (ref 3.5–5.1)
Sodium: 134 mmol/L — ABNORMAL LOW (ref 135–145)
Total Bilirubin: 1.3 mg/dL — ABNORMAL HIGH (ref 0.3–1.2)
Total Protein: 6.7 g/dL (ref 6.5–8.1)

## 2020-10-12 LAB — PROTIME-INR
INR: 1.4 — ABNORMAL HIGH (ref 0.8–1.2)
Prothrombin Time: 16.6 seconds — ABNORMAL HIGH (ref 11.4–15.2)

## 2020-10-12 LAB — HEMOGLOBIN A1C
Hgb A1c MFr Bld: 7.8 % — ABNORMAL HIGH (ref 4.8–5.6)
Mean Plasma Glucose: 177.16 mg/dL

## 2020-10-12 LAB — COOXEMETRY PANEL
Carboxyhemoglobin: 2.4 % — ABNORMAL HIGH (ref 0.5–1.5)
Methemoglobin: 0.7 % (ref 0.0–1.5)
O2 Saturation: 66.8 %
Total hemoglobin: 8.6 g/dL — ABNORMAL LOW (ref 12.0–16.0)

## 2020-10-12 LAB — MRSA PCR SCREENING: MRSA by PCR: NEGATIVE

## 2020-10-12 LAB — MAGNESIUM: Magnesium: 2.2 mg/dL (ref 1.7–2.4)

## 2020-10-12 LAB — HEPARIN LEVEL (UNFRACTIONATED)
Heparin Unfractionated: 0.91 IU/mL — ABNORMAL HIGH (ref 0.30–0.70)
Heparin Unfractionated: 0.23 IU/mL — ABNORMAL LOW (ref 0.30–0.70)
Heparin Unfractionated: 0.21 IU/mL — ABNORMAL LOW (ref 0.30–0.70)

## 2020-10-12 LAB — LACTATE DEHYDROGENASE: LDH: 340 U/L — ABNORMAL HIGH (ref 98–192)

## 2020-10-12 MED ORDER — TRANEXAMIC ACID (OHS) PUMP PRIME SOLUTION
2.0000 mg/kg | INTRAVENOUS | Status: DC
  Filled 2020-10-12: qty 1.83

## 2020-10-12 MED ORDER — DIAZEPAM 5 MG PO TABS
5.0000 mg | ORAL_TABLET | Freq: Once | ORAL | Status: AC
  Administered 2020-10-13: 5 mg via ORAL
  Filled 2020-10-12: qty 1

## 2020-10-12 MED ORDER — SODIUM CHLORIDE 0.9 % IV SOLN: 600.0000 mg | INTRAVENOUS | Status: DC

## 2020-10-12 MED ORDER — MAGNESIUM SULFATE 50 % IJ SOLN
40.0000 meq | INTRAMUSCULAR | Status: DC
  Filled 2020-10-12: qty 9.85

## 2020-10-12 MED ORDER — TEMAZEPAM 15 MG PO CAPS: 15.0000 mg | ORAL_CAPSULE | Freq: Once | ORAL | Status: DC | PRN

## 2020-10-12 MED ORDER — FUROSEMIDE 10 MG/ML IJ SOLN
80.0000 mg | Freq: Once | INTRAMUSCULAR | Status: AC
  Administered 2020-10-12: 80 mg via INTRAVENOUS
  Filled 2020-10-12: qty 8

## 2020-10-12 MED ORDER — VANCOMYCIN HCL 1500 MG/300ML IV SOLN
1500.0000 mg | INTRAVENOUS | Status: AC
  Administered 2020-10-13: 1500 mg via INTRAVENOUS
  Filled 2020-10-12: qty 300

## 2020-10-12 MED ORDER — PHENYLEPHRINE HCL-NACL 20-0.9 MG/250ML-% IV SOLN
30.0000 ug/min | INTRAVENOUS | Status: DC
  Filled 2020-10-12: qty 250

## 2020-10-12 MED ORDER — NOREPINEPHRINE 4 MG/250ML-% IV SOLN
0.0000 ug/min | INTRAVENOUS | Status: DC
  Filled 2020-10-12 (×2): qty 250

## 2020-10-12 MED ORDER — TRANEXAMIC ACID (OHS) BOLUS VIA INFUSION
15.0000 mg/kg | INTRAVENOUS | Status: AC
  Administered 2020-10-13: 1371 mg via INTRAVENOUS
  Filled 2020-10-12: qty 1371

## 2020-10-12 MED ORDER — INSULIN REGULAR(HUMAN) IN NACL 100-0.9 UT/100ML-% IV SOLN
INTRAVENOUS | Status: AC
  Administered 2020-10-13: 11.5 [IU]/h via INTRAVENOUS
  Administered 2020-10-13: 8.5 [IU]/h via INTRAVENOUS
  Filled 2020-10-12: qty 100

## 2020-10-12 MED ORDER — MILRINONE LACTATE IN DEXTROSE 20-5 MG/100ML-% IV SOLN
0.3000 ug/kg/min | INTRAVENOUS | Status: AC
  Administered 2020-10-13: .5 ug/kg/min via INTRAVENOUS
  Filled 2020-10-12: qty 100

## 2020-10-12 MED ORDER — PLASMA-LYTE 148 IV SOLN
INTRAVENOUS | Status: DC
  Filled 2020-10-12: qty 2.5

## 2020-10-12 MED ORDER — SODIUM CHLORIDE 0.9 % IV SOLN
750.0000 mg | INTRAVENOUS | Status: AC
  Administered 2020-10-13: 750 mg via INTRAVENOUS
  Filled 2020-10-12: qty 750

## 2020-10-12 MED ORDER — DOPAMINE-DEXTROSE 3.2-5 MG/ML-% IV SOLN
0.0000 ug/kg/min | INTRAVENOUS | Status: DC
  Filled 2020-10-12: qty 250

## 2020-10-12 MED ORDER — MUPIROCIN 2 % EX OINT
1.0000 "application " | TOPICAL_OINTMENT | Freq: Two times a day (BID) | CUTANEOUS | Status: DC
  Administered 2020-10-12: 1 via NASAL
  Filled 2020-10-12: qty 22

## 2020-10-12 MED ORDER — CHLORHEXIDINE GLUCONATE 0.12 % MT SOLN
15.0000 mL | Freq: Once | OROMUCOSAL | Status: AC
  Administered 2020-10-13: 15 mL via OROMUCOSAL
  Filled 2020-10-12: qty 15

## 2020-10-12 MED ORDER — NITROGLYCERIN IN D5W 200-5 MCG/ML-% IV SOLN
2.0000 ug/min | INTRAVENOUS | Status: DC
  Filled 2020-10-12: qty 250

## 2020-10-12 MED ORDER — VANCOMYCIN HCL 1 G IV SOLR
1000.0000 mg | INTRAVENOUS | Status: DC
  Filled 2020-10-12: qty 1000

## 2020-10-12 MED ORDER — BISACODYL 5 MG PO TBEC: 5.0000 mg | DELAYED_RELEASE_TABLET | Freq: Once | ORAL | Status: DC

## 2020-10-12 MED ORDER — FLUCONAZOLE IN SODIUM CHLORIDE 400-0.9 MG/200ML-% IV SOLN
400.0000 mg | INTRAVENOUS | Status: AC
  Administered 2020-10-13: 400 mg via INTRAVENOUS
  Filled 2020-10-12: qty 200

## 2020-10-12 MED ORDER — DOBUTAMINE IN D5W 4-5 MG/ML-% IV SOLN
2.0000 ug/kg/min | INTRAVENOUS | Status: DC
  Filled 2020-10-12: qty 250

## 2020-10-12 MED ORDER — TRANEXAMIC ACID 1000 MG/10ML IV SOLN
1.5000 mg/kg/h | INTRAVENOUS | Status: AC
  Administered 2020-10-13: 1.5 mg/kg/h via INTRAVENOUS
  Filled 2020-10-12: qty 25

## 2020-10-12 MED ORDER — CHLORHEXIDINE GLUCONATE CLOTH 2 % EX PADS
6.0000 | MEDICATED_PAD | Freq: Once | CUTANEOUS | Status: AC
  Administered 2020-10-13: 6 via TOPICAL

## 2020-10-12 MED ORDER — VASOPRESSIN 20 UNITS/100 ML INFUSION FOR SHOCK
0.0400 [IU]/min | INTRAVENOUS | Status: AC
  Administered 2020-10-13: .03 [IU]/min via INTRAVENOUS
  Filled 2020-10-12: qty 100

## 2020-10-12 MED ORDER — SODIUM CHLORIDE 0.9 % IV SOLN
600.0000 mg | INTRAVENOUS | Status: AC
  Administered 2020-10-13: 600 mg via INTRAVENOUS
  Filled 2020-10-12: qty 600

## 2020-10-12 MED ORDER — CHLORHEXIDINE GLUCONATE CLOTH 2 % EX PADS
6.0000 | MEDICATED_PAD | Freq: Once | CUTANEOUS | Status: AC
  Administered 2020-10-12: 6 via TOPICAL

## 2020-10-12 MED ORDER — SODIUM CHLORIDE 0.9 % IV SOLN
INTRAVENOUS | Status: DC
  Filled 2020-10-12: qty 30

## 2020-10-12 MED ORDER — SODIUM CHLORIDE 0.9 % IV SOLN
1.5000 g | INTRAVENOUS | Status: AC
  Administered 2020-10-13: 1.5 g via INTRAVENOUS
  Filled 2020-10-12: qty 1.5

## 2020-10-12 MED ORDER — INSULIN ASPART 100 UNIT/ML ~~LOC~~ SOLN
4.0000 [IU] | Freq: Three times a day (TID) | SUBCUTANEOUS | Status: DC
  Administered 2020-10-12 (×3): 4 [IU] via SUBCUTANEOUS

## 2020-10-12 MED ORDER — DEXMEDETOMIDINE HCL IN NACL 400 MCG/100ML IV SOLN
0.1000 ug/kg/h | INTRAVENOUS | Status: AC
  Administered 2020-10-13: .3 ug/kg/h via INTRAVENOUS
  Administered 2020-10-13: .7 ug/kg/h via INTRAVENOUS
  Filled 2020-10-12: qty 100

## 2020-10-12 MED ORDER — POTASSIUM CHLORIDE 2 MEQ/ML IV SOLN
80.0000 meq | INTRAVENOUS | Status: DC
  Filled 2020-10-12: qty 40

## 2020-10-12 MED ORDER — INSULIN ASPART 100 UNIT/ML ~~LOC~~ SOLN
0.0000 [IU] | Freq: Three times a day (TID) | SUBCUTANEOUS | Status: DC
  Administered 2020-10-12 (×2): 7 [IU] via SUBCUTANEOUS

## 2020-10-12 MED ORDER — EPINEPHRINE HCL 5 MG/250ML IV SOLN IN NS
0.0000 ug/min | INTRAVENOUS | Status: AC
  Administered 2020-10-13: 1 ug/min via INTRAVENOUS
  Filled 2020-10-12: qty 250

## 2020-10-12 NOTE — Progress Notes (Signed)
CSW attempted to visit patient at bedside although patient sleeping and did not want to wake. CSW will follow up with family tomorrow while patient in OR. CSW continues to follow for supportive intervention throughout hospital stay. Raquel Sarna, [place], [place]'

## 2020-10-12 NOTE — Progress Notes (Signed)
Inpatient Diabetes Program Recommendations  AACE/ADA: New Consensus Statement on Inpatient Glycemic Control   Target Ranges:  Prepandial:   less than 140 mg/dL      Peak postprandial:   less than 180 mg/dL (1-2 hours)      Critically ill patients:  140 - 180 mg/dL   Results for FED, CECI (MRN [id number]) as of 10/12/2020 10:56  Ref. Range 10/11/2020 06:54 10/11/2020 11:17 10/11/2020 16:26 10/11/2020 21:13 10/12/2020 06:56  Glucose-Capillary Latest Ref Range: 70 - 99 mg/dL 170 (H) 212 (H) 269 (H) 259 (H) 190 (H)   Review of Glycemic Control  Diabetes history:DM2 Outpatient Diabetes medications:Jardiance 10 mg daily, Novolog 5 units TID with meals, Lantus 50 units QHS Current orders for Inpatient glycemic control:Lantus 10 units QHS, Novolog 4 units TID with meals for meal coverage, Novolog 0-5 units QHS; IV insulin to surgery 10/13/20  Inpatient Diabetes Program Recommendations:  Insulin:Please consider ordering Novolog 0-20 units TID with meals for correction (continue Novolog 4 units TID with meals for meal coverage and Novolog 0-5 units QHS, and Lantus 10 units QHS).   NOTE: Noted order for IV insulin to start 10/13/20.  Thanks, Barnie Alderman, RN, MSN, CDE Diabetes Coordinator Inpatient Diabetes Program [phone] (Team Pager from 8am to 5pm)

## 2020-10-12 NOTE — Progress Notes (Addendum)
[place] for heparin Indication: atrial fibrillation  Allergies  Allergen Reactions  . Meclizine Hcl Anaphylaxis and Swelling  . Ivabradine Nausea Only    Patient Measurements: Height: 5' 10.5" (179.1 cm) Weight: 91.4 kg (201 lb 8 oz) IBW/kg (Calculated) : 74.15 Heparin Dosing Weight: 88.5 kg   Vital Signs: Temp: 98.2 F (36.8 C) (03/09 1115) Temp Source: Oral (03/09 1115) BP: 120/78 (03/09 1400) Pulse Rate: 86 (03/09 1500)  Labs: Recent Labs    10/10/20 0224 10/10/20 1435 10/11/20 0506 10/12/20 0519 10/12/20 0643 10/12/20 0648 10/12/20 1348  HGB 8.5*  --  9.1* 8.6*  --   --   --   HCT 28.1*  --  30.1* 28.5*  --   --   --   PLT 166  --  192 189  --   --   --   APTT 57*   < > 45* >200*  --  60*  --   HEPARINUNFRC 0.32  --  0.36 0.91* 0.23*  --  0.21*  CREATININE 2.18*  --  1.84* 1.73*  --   --   --    < > = values in this interval not displayed.    Estimated Creatinine Clearance: 52.7 mL/min (A) (by C-G formula based on SCr of 1.73 mg/dL (H)).   Assessment: 27 yom presenting with volume overload with low output HF - plan for impella placement while undergoing LVAD w/u. On apixaban PTA for hx Afib - LD 2/27@2144 .  Patient s/p Impella placement on 3/1 and started on heparin purge (50 units/mL). He is now s/p tooth extraction 3/4 and heparin has been resumed.   Impella remains at P8. Pressures stable in 500-600s. Heparin purge running at 6.2 ml/hr (320 units/hr). Also on systemic heparin at 1000 units/hr for a total of ~1320 units/hr.   No bleeding per discussion with nursing. HL remains subtherapeutic, however heparin was stopped for 30 minutes prior to drawing level since systemic heparin is running through PICC (no other access). Given that patient is going for LVAD implant tomorrow morning and has been stable on [phone] units/hr systemic heparin, will continue and f/u post-procedure.  Goal of Therapy:  Heparin level 0.3-0.5  units/ml Monitor platelets by anticoagulation protocol: Yes   Plan:  -Continue systemic heparin at 1,000 units/hr -F/u [facility] restart plans post-LVAD implant -Monitor daily HL, CBC, s/sx bleeding while on heparin  [name], PharmD, BCPS PGY2 Cardiology Pharmacy Resident Phone: [phone] 10/12/2020  3:20 PM  Please check [email or web address] for unit-specific pharmacy phone numbers.

## 2020-10-12 NOTE — Progress Notes (Signed)
[place] for heparin Indication: atrial fibrillation  Allergies  Allergen Reactions  . Meclizine Hcl Anaphylaxis and Swelling  . Ivabradine Nausea Only    Patient Measurements: Height: 5' 10.5" (179.1 cm) Weight: 91.4 kg (201 lb 8 oz) IBW/kg (Calculated) : 74.15 Heparin Dosing Weight: 88.5 kg   Vital Signs: Temp: 99.6 F (37.6 C) (03/09 0707) Temp Source: Oral (03/09 0707) BP: 106/77 (03/09 0600) Pulse Rate: 87 (03/09 0700)  Labs: Recent Labs    10/10/20 0224 10/10/20 1435 10/11/20 0506 10/12/20 0519 10/12/20 0643 10/12/20 0648  HGB 8.5*  --  9.1* 8.6*  --   --   HCT 28.1*  --  30.1* 28.5*  --   --   PLT 166  --  192 189  --   --   APTT 57*   < > 45* >200*  --  60*  HEPARINUNFRC 0.32  --  0.36 0.91* 0.23*  --   CREATININE 2.18*  --  1.84* 1.73*  --   --    < > = values in this interval not displayed.    Estimated Creatinine Clearance: 52.7 mL/min (A) (by C-G formula based on SCr of 1.73 mg/dL (H)).   Assessment: 58 yom presenting with volume overload with low output HF - plan for impella placement while undergoing LVAD w/u. On apixaban PTA for hx Afib - LD 2/27@2144 .  Patient s/p Impella placement on 3/1 and started on heparin purge (50 units/mL). He is now s/p tooth extraction 3/4 and heparin has been resumed.   Impella remains at P8. Pressures stable in 500-600s. Heparin purge running at 6.4 ml/hr (320 units/hr). Hgb low but stable 8-9, pltc ok. Slight oozing from Impella site but no infusion issues per discussion with nursing. Initial heparin level today was elevated at 0.91 but drawn from PICC; repeat draw from IJ resulted slightly subtherapeutic at 0.23. Of note, LVAD surgery is scheduled for tomorrow.  Goal of Therapy:  Heparin level 0.3-0.5 units/ml Monitor platelets by anticoagulation protocol: Yes   Plan:  -Increase systemic heparin to 1,000 units/hr -F/u with heparin level in 6 hours -Monitor daily aPTT, HL,  CBC, s/sx bleeding -F/u with plans for LVAD tomorrow  Mercy Riding, PharmD PGY1 Acute Care Pharmacy Resident  Please check [email or web address] for unit-specific pharmacy phone numbers.

## 2020-10-12 NOTE — Progress Notes (Addendum)
Prior authorization for LVAD implantation received from [facility].  Authorization #: [id number].  Emerson Monte RN [place] Coordinator  Office: [phone]  24/7 Pager: [phone] '

## 2020-10-12 NOTE — Progress Notes (Signed)
CARDIAC REHAB PHASE I   PRE:  Rate/Rhythm: 90 SR  BP:  Sitting: 120/64      SaO2: 100 RA  MODE:  Ambulation: 370 ft   POST:  Rate/Rhythm: 100 ST  BP:  Sitting: 116/73    SaO2: 100 RA   Pt ambulated 369ft in hallway handheld assist with gait belt. Pt denies SOB or dizziness throughout walk. Still c/o R arm pain and numbness. RN present throughout session to manage Impella and lines. Pt returned to chair. Able to demonstrate ~1250 on IS. Encouraged sitting in recliner, IS use, and ambulation as able. Pt optimistic for upcoming surgery. Provided support and encouragement. Will continue to follow pt.  [phone] Rufina Falco, RN BSN 10/12/2020 8:56 AM

## 2020-10-12 NOTE — Progress Notes (Signed)
Patient ID: Marcus Johnson., male   DOB: 06-10-1961, 60 y.o.   MRN: [id number]      Advanced Heart Failure Rounding Note  PCP-Cardiologist: No primary care provider on file.   Subjective:    2/27 Milrinone switched to dobutamine 5 mcg.  2/28 Moved to ICU. Dobutamine increased to 7.5 mcg and Norepi 3 mcg added. Diuretics held.  3/1 Impella 5.5 placed.  3/4 Teeth removed  Creatinine 2.45 => 2.23 => 2.18 => 1.84 => 1.7. Remains on dobutamine 2 + NE 3. I/O even, no weight. Swan removed yesterday due to dampened waveform that could not be corrected.  This morning, CVP up to 14 with co-ox 67%.   Right arm pain more controlled this morning.   He has been walking in the hall.  Impella 5.5: P8 Flow 4.1 No alarms LDH 384 => 395 => 350 => 328 => 344 => 340 Hgb 10.3 => 10.3 => 9 => 8.3 => 9.1 => 8.6 plts 188 => 159 => 166 => 166 => 192 => 189  Echo: EF 20-25%, normal RV, PASP 61, dilated IVC.    Objective:   Weight Range: 91.4 kg Body mass index is 28.5 kg/m.   Vital Signs:   Temp:  [98.6 F (37 C)-99.6 F (37.6 C)] 99.6 F (37.6 C) (03/09 0707) Pulse Rate:  [83-97] 87 (03/09 0700) Resp:  [9-28] 28 (03/09 0700) BP: (98-129)/(69-114) 106/77 (03/09 0600) SpO2:  [94 %-100 %] 99 % (03/09 0700) Last BM Date: 10/11/20  Weight change: Filed Weights   10/08/20 1500 10/09/20 0600 10/10/20 0600  Weight: 91.2 kg 91.9 kg 91.4 kg    Intake/Output:   Intake/Output Summary (Last 24 hours) at 10/12/2020 0739 Last data filed at 10/12/2020 0700 Gross per 24 hour  Intake 1177.23 ml  Output 1800 ml  Net -622.77 ml      Physical Exam  CVP 14.  General: NAD Neck: JVP 10-12 cm, no thyromegaly or thyroid nodule.  Lungs: Clear to auscultation bilaterally with normal respiratory effort. CV: Nondisplaced PMI.  Heart irregular S1/S2, no S3/S4, no murmur.  No peripheral edema.  Abdomen: Soft, nontender, no hepatosplenomegaly, no distention.  Skin: Intact without lesions or rashes.   Neurologic: Alert and oriented x 3.  Psych: Normal affect. Extremities: No clubbing or cyanosis.  HEENT: Normal.    Telemetry   Atrial fibrillation 90s (personally reviewed)  Labs    CBC Recent Labs    10/11/20 0506 10/12/20 0519  WBC 14.0* 13.3*  HGB 9.1* 8.6*  HCT 30.1* 28.5*  MCV 82.7 83.1  PLT 192 016   Basic Metabolic Panel Recent Labs    10/11/20 0506 10/12/20 0519  NA 134* 135  K 3.8 4.0  CL 101 103  CO2 25 25  GLUCOSE 186* 200*  BUN 29* 26*  CREATININE 1.84* 1.73*  CALCIUM 8.7* 8.6*   Liver Function Tests No results for input(s): AST, ALT, ALKPHOS, BILITOT, PROT, ALBUMIN in the last 72 hours. No results for input(s): LIPASE, AMYLASE in the last 72 hours. Cardiac Enzymes No results for input(s): CKTOTAL, CKMB, CKMBINDEX, TROPONINI in the last 72 hours.  BNP: BNP (last 3 results) No results for input(s): BNP in the last 8760 hours.  ProBNP (last 3 results) No results for input(s): PROBNP in the last 8760 hours.   D-Dimer No results for input(s): DDIMER in the last 72 hours. Hemoglobin A1C No results for input(s): HGBA1C in the last 72 hours. Fasting Lipid Panel No results for input(s): CHOL, HDL,  LDLCALC, TRIG, CHOLHDL, LDLDIRECT in the last 72 hours. Thyroid Function Tests No results for input(s): TSH, T4TOTAL, T3FREE, THYROIDAB in the last 72 hours.  Invalid input(s): FREET3  Other results:   Imaging    No results found.   Medications:     Scheduled Medications: . (feeding supplement) PROSource Plus  30 mL Oral BID BM  . sodium chloride   Intravenous Once  . amiodarone  200 mg Oral BID  . Chlorhexidine Gluconate Cloth  6 each Topical Daily  . docusate sodium  100 mg Oral BID  . feeding supplement  237 mL Oral BID BM  . gabapentin  300 mg Oral TID  . insulin aspart  0-20 Units Subcutaneous TID WC  . insulin aspart  0-5 Units Subcutaneous QHS  . insulin glargine  10 Units Subcutaneous QHS  . lidocaine  1 patch Transdermal  Q24H  . mouth rinse  15 mL Mouth Rinse BID  . multivitamin with minerals  1 tablet Oral Daily  . rosuvastatin  40 mg Oral Daily  . sildenafil  40 mg Oral TID  . sodium chloride flush  10-40 mL Intracatheter Q12H    Infusions: . sodium chloride 250 mL (10/09/20 1632)  . DOBUTamine 2 mcg/kg/min (10/12/20 0700)  . heparin 900 Units/hr (10/11/20 1956)  . impella catheter heparin 50 unit/mL in dextrose 5% 6.1 mL/hr at 10/05/20 0851  . lactated ringers Stopped (10/11/20 1612)  . lactated ringers    . norepinephrine (LEVOPHED) Adult infusion 3 mcg/min (10/12/20 0700)    PRN Medications: sodium chloride, acetaminophen, ALPRAZolam, aminocaproic acid, bisacodyl, HYDROmorphone (DILAUDID) injection, ondansetron (ZOFRAN) IV, ondansetron, oxyCODONE, oxyCODONE-acetaminophen, polyethylene glycol, sodium chloride flush, white petrolatum    Assessment/Plan   1. COVID-19 infection:in 1/22. Manifested primarily as nausea/vomiting. Very slow recovery, did not take meds for at least 3 wks but had recently started back on meds just prior to admission.   - Suspect that nausea/poor appetite was actually due to HF.   2.Acute on chronic systolic HF:ICM.HasMedtronic ICD. Narrow QRS, not CRT candidate. Has required milrinone multiple times in the past for low output HF. Echo 6/19 with EF 20% and moderately decreased RV systolic function.CPX 4/19 with severe functional limitation due to [facility] 6/19 showed low output and coronary angiography showed no interventional targets.Echo in 4/21 showed EF 25%, mildly decreased RV systolic function. CPX in 5/21 showed severe HF limitation. He has been resistant to LVAD/transplant evaluation, both offered with discussions in past. I worry that we will miss the window for advanced therapies. He was not interested in barostimulatoractivation therapy. History of cardiorenal syndrome/diabetic nephropathy, creatinine up to 2.33 in 1/22, likely due to dehydration  with nausea/vomiting.He had been off meds for several weeks, just restarted 3-4 days PTA. Admitted with NYHA class IV symptoms.  Low output persisted despite milrinone and then dobutamine with rise in creatinine to 2.5. Impella 5.5 placed 3/1.  He is currently on dobutamine 2 + NE 3. This morning, CVP up to 14, Swan removed due to dampened waveform that could not be corrected. PA pressure lower now on sildenafil.  Creatinine down to 1.7. - VAD workup has been initiated, renal function has been limiting factor, now improved.  Has had teeth out.  Plan for LVAD Thursday.  - Continue Impella P8, good position by echo done 3/7 (5-5.2 cm).  Continue heparin gtt. Stable LDH and plts.  - Lasix 80 mg IV x 1 today.   - Continue dobutamine 2 + NE 3.  - Continue sildenafil  40 mg tid with pulmonary hypertension.  - Off carvedilol with low output - Will hold spiro with ongoing AKI - Off empagliflozin for now with AKI  - Off Entresto with low BP  2. CAD s/p [id number]:Last cath in 6/19 with patent grafts. No s/s ischemia  -No ASA given apixaban use.  - Continue Crestor 40 mg daily.  3. AKI on CKD Stage 3b: I suspect that this is a combination of cardiorenal syndrome and diabetic nephropathy and possibly ATN. Creatinine down from 2.5 to 1.7.  - continue hemodynamic support.  4. WS:FKCLEXN of very poor control but hgbA1ctrending down. Most recent 7.7%  - Cover with SSI. 5. Atrial flutter/fibrillation: s/p DC-CV 08/21/18.He is in atrial fibrillation this morning.  - Continue amiodarone 200 mg bid.   - Heparin gtt with Impella.  6. Wound R Foot: Partial thickness skin loss noted. Cover with mepilex border and change every 5 days.  7. Right arm pain: Think neuropathic, per Dr. Cyndia Bent patient had stretch of brachial plexus with Impella placement. Improved.  - Continue gabapentin.  - Lidocaine patch.  8. ID: Afebrile, negative cultures.    CRITICAL CARE Performed by: Loralie Champagne  Total  critical care time: 40 minutes  Critical care time was exclusive of separately billable procedures and treating other patients.  Critical care was necessary to treat or prevent imminent or life-threatening deterioration.  Critical care was time spent personally by me on the following activities: development of treatment plan with patient and/or surrogate as well as nursing, discussions with consultants, evaluation of patient's response to treatment, examination of patient, obtaining history from patient or surrogate, ordering and performing treatments and interventions, ordering and review of laboratory studies, ordering and review of radiographic studies, pulse oximetry and re-evaluation of patient's condition.   Length of Stay: [place], MD  10/12/2020, 7:39 AM  Advanced Heart Failure Team Pager [phone] (M-F; 7a - 4p)  Please contact [facility] for night-coverage after hours (4p -7a ) and weekends on [email or web address]

## 2020-10-12 NOTE — Progress Notes (Signed)
VAD Coordinator met with patient at bedside, with his brother and son on speaker-phone, to continue VAD education and answer any questions. Discussed plan for surgery tomorrow. Pt would prefer we contact Marcus Johnson (son) but as he will be working he may not be able to answer the phone. Pt's brother Marcus Johnson will be available all day by phone. They all feel they understand the upcoming surgery and recovery. All questions answered at this time. Provided pt's brother and son with 2H unit phone number.   Pt complaining of right arm pain, with numbness into his right hand. Per Dr Cyndia Bent this is due to Impella graft positioning. He is able to bend thumb and last 3 fingers. His pointer finger will not bend. Lidocaine patch present on forearm. States Tylenol has been "knocking the edge off. I don't like to take opiates because they make me feel funny."    Patient completed pre-implant Intermacs questionnaires including EQ-5D-3L QOL, and KCCQ-12. He was unable to complete [employer] Trail-making due to right arm pain/right hand numbness due to Impella location. 6 minute walk completed with PT.   Emerson Monte RN [place] Coordinator  Office: [phone]  24/7 Pager: [phone]

## 2020-10-12 NOTE — Progress Notes (Signed)
CSW spoke with patient's son about caregiver role and life with an LVAD. [name] states he feels that he has received good information and verbalizes understanding of the caregiver role. CSW spoke about concerns of possible need for dialysis short/long term post surgery and clarified with [name] that family is aware and the implications of dialysis post surgery. Cody verbalizes understanding of risk and impact of dialysis (I.e.transport needs). CSW continues to follow for LVAD implant and supportive needs. Raquel Sarna, [place], [place]

## 2020-10-12 NOTE — Progress Notes (Signed)
5 Days Post-Op Procedure(s) (LRB): MULTIPLE EXTRACTION WITH ALVEOLOPLASTY (N/A) Subjective: Feels ok. Right arm pain better. Hand still numb. Mouth feels ok  Objective: Vital signs in last 24 hours: Temp:  [98.6 F (37 C)-99.6 F (37.6 C)] 99.6 F (37.6 C) (03/09 0707) Pulse Rate:  [83-97] 87 (03/09 0700) Cardiac Rhythm: Normal sinus rhythm (03/09 0400) Resp:  [9-28] 28 (03/09 0700) BP: (98-129)/(69-114) 106/77 (03/09 0600) SpO2:  [94 %-100 %] 99 % (03/09 0700)  Hemodynamic parameters for last 24 hours: PAP: (9-18)/(-4-3) 18/1 CVP:  [12 mmHg-35 mmHg] 12 mmHg  Intake/Output from previous day: 03/08 0701 - 03/09 0700 In: 1177.2 [I.V.:1028.5] Out: 1800 [Urine:1800] Intake/Output this shift: No intake/output data recorded.    Lab Results: Recent Labs    10/11/20 0506 10/12/20 0519  WBC 14.0* 13.3*  HGB 9.1* 8.6*  HCT 30.1* 28.5*  PLT 192 189   BMET:  Recent Labs    10/11/20 0506 10/12/20 0519  NA 134* 135  K 3.8 4.0  CL 101 103  CO2 25 25  GLUCOSE 186* 200*  BUN 29* 26*  CREATININE 1.84* 1.73*  CALCIUM 8.7* 8.6*    PT/INR: No results for input(s): LABPROT, INR in the last 72 hours. ABG    Component Value Date/Time   PHART 7.460 (H) 10/07/2020 1536   HCO3 29.4 (H) 10/07/2020 1536   TCO2 31 10/07/2020 1536   ACIDBASEDEF 3.0 (H) 01/18/2015 0823   O2SAT 66.8 10/12/2020 0552   CBG (last 3)  Recent Labs    10/11/20 1626 10/11/20 2113 10/12/20 0656  GLUCAP 269* 259* 190*    Assessment/Plan:  He remains hemodynamically stable on dobut 2, NE 3 with Co-ox 67. Impella on P8 CVP 14 this am and receiving some lasix.  Creat 1.7 today.  He is in satisfactory condition to proceed with HeartMate 3 LVAD tomorrow am. He has no further questions.   I discussed the operative procedure with the patient including alternatives, benefits and risks; including but not limited to bleeding, blood transfusion, infection, stroke, right heart failure,  organ dysfunction,  renal failure requiring dialysis and death.  [name]. understands and agrees to proceed.  We will schedule surgery for tomorrow am.   LOS: 14 days    Gaye Pollack 10/12/2020

## 2020-10-12 NOTE — Progress Notes (Signed)
Marcus Johnson has been discussed with the VAD Medical Review board on 10/10/20. The team feels as if the patient is a good candidate for Destination LVAD therapy. The patient meets criteria for a LVAD implant as listed below:  1) NYHA Class: IV documented on 10/12/20   2) Has a left ventricular ejection fraction (LVEF) < 25%   *EF_20-25%_ by echo (date) 09/29/20  3) Must meet one of the following:  .  Is inotrope dependent   *On inotropes: Dobutamine started 10/02/20     (previously on Milrinone 09/28/20-10/02/20)  OR  . Has a Cardiac Index (CI) < 2.2  L/min/m2 while not on inotropes:   *CI:______   4) Must meet one of the following:   ___X___ Is on optimal medical management (OMM), based on current heart failure practice guidelines for at least 60 of the last 60 days and are failing to respond   ______ Has advanced heart failure for at least 14 days and are dependent on an intra-aortic balloon pump (IABP) or similar temporary mechanical circulatory support for at least 7 days      ____ IABP inserted (date) ____      _X_ Impella inserted (date) 10/04/20  5)  Social work and palliative care evaluations demonstrate appropriate support system in place for discharge to home with a VAD and that end of life discussions have taken place. Both services have expressed no concern regarding patient's candidacy.         *Social work consult (date): 10/04/20        *Palliative Care Consult (date): 10/03/20  6)  Primary caretaker identified that can be taught along with the patient how to manage the VAD equipment.        *Name: Marcus Johnson (brother) & Marcus Johnson (son)  7)  Deemed appropriate by our financial coordinator: Marcus Johnson        Prior approval: 10/11/20  Authorization #: [id number]  8)  VAD Coordinator, Marcus Johnson has met with patient and caregiver, shown them the VAD equipment and discussed with the patient and caregiver about lifestyle changes necessary for success on mechanical  circulatory device.        *Met with Marcus Johnson on 09/30/20.       *Consent for VAD Evaluation/Caregiver Agreement/HIPPA Release/Photo Release signed on 09/30/20   9)  Six Minute Walk: 64 ft   10)  Intermacs profile: 2  INTERMACS 1: Critical cardiogenic shock describes a patient who is "crashing and burning", in which a patient has life-threatening hypotension and rapidly escalating inotropic pressor support, with critical organ hypoperfusion often confirmed by worsening acidosis and lactate levels.  INTERMACS 2: Progressive decline describes a patient who has been demonstrated "dependent" on inotropic support but nonetheless shows signs of continuing deterioration in nutrition, renal function, fluid retention, or other major status indicator. Patient profile 2 can also describe a patient with refractory volume overload, perhaps with evidence of impaired perfusion, in whom inotropic infusions cannot be maintained due to tachyarrhythmias, clinical ischemia, or other intolerance.  INTERMACS 3: Stable but inotrope dependent describes a patient who is clinically stable on mild-moderate doses of intravenous inotropes (or has a temporary circulatory support device) after repeated documentation of failure to wean without symptomatic hypotension, worsening symptoms, or progressive organ dysfunction (usually renal). It is critical to monitor nutrition, renal function, fluid balance, and overall status carefully in order to distinguish between a   patient who is truly stable at Patient Profile 3 and a patient who has unappreciated  decline rendering them Patient Profile 2. This patient may be either at home or in the hospital.      INTERMACS 4: Resting symptoms describes a patient who is at home on oral therapy but frequently has symptoms of congestion at rest or with activities of daily living (ADL). He or she may have orthopnea, shortness of breath during ADL such as dressing or bathing,  gastrointestinal symptoms (abdominal discomfort, nausea, poor appetite), disabling ascites or severe lower extremity edema. This patient should be carefully considered for more intensive management and surveillance programs, which may in some cases, reveal poor compliance that would compromise outcomes with any therapy.   .   INTERMACS 5: Exertion Intolerant describes a patient who is comfortable at rest but unable to engage in any activity, living predominantly within the house or housebound. This patient has no congestive symptoms, but may have chronically elevated volume status, frequently with renal dysfunction, and may be characterized as exercise intolerant.      INTERMACS 6: Exertion Limited also describes a patient who is comfortable at rest without evidence of fluid overload, but who is able to do some mild activity. Activities of daily living are comfortable and minor activities outside the home such as visiting friends or going to a restaurant can be performed, but fatigue results within a few minutes of any meaningful physical exertion. This patient has occasional episodes of worsening symptoms and is likely to have had a hospitalization for heart failure within the past year.   INTERMACS 7: Advanced NYHA Class 3 describes a patient who is clinically stable with a reasonable level of comfortable activity, despite history of previous decompensation that is not recent. This patient is usually able to walk more than a block. Any decompensation requiring intravenous diuretics or hospitalization within the previous month should make this person a Patient Profile 6 or lower.     Emerson Monte RN [place] Coordinator  Office: [phone]  24/7 Pager: [phone]

## 2020-10-13 ENCOUNTER — Inpatient Hospital Stay (HOSPITAL_COMMUNITY): Payer: Medicare HMO

## 2020-10-13 ENCOUNTER — Inpatient Hospital Stay (HOSPITAL_COMMUNITY): Payer: Medicare HMO | Admitting: Anesthesiology

## 2020-10-13 ENCOUNTER — Inpatient Hospital Stay (HOSPITAL_COMMUNITY)
Admission: AD | Disposition: A | Discharge status: Rehabilitation Facility | Payer: Self-pay | Source: Home / Self Care | Attending: Surgery

## 2020-10-13 DIAGNOSIS — I5023 Acute on chronic systolic (congestive) heart failure: Secondary | ICD-10-CM

## 2020-10-13 DIAGNOSIS — Z95811 Presence of heart assist device: Secondary | ICD-10-CM | POA: Diagnosis not present

## 2020-10-13 HISTORY — PX: REMOVAL OF IMPELLA LEFT VENTRICULAR ASSIST DEVICE: SHX6556

## 2020-10-13 HISTORY — PX: INSERTION OF IMPLANTABLE LEFT VENTRICULAR ASSIST DEVICE: SHX5866

## 2020-10-13 HISTORY — PX: TEE WITHOUT CARDIOVERSION: SHX5443

## 2020-10-13 LAB — POCT I-STAT 7, (LYTES, BLD GAS, ICA,H+H)
Acid-Base Excess: 3 mmol/L — ABNORMAL HIGH (ref 0.0–2.0)
Acid-Base Excess: 1 mmol/L (ref 0.0–2.0)
Acid-Base Excess: 1 mmol/L (ref 0.0–2.0)
Acid-Base Excess: 0 mmol/L (ref 0.0–2.0)
Acid-base deficit: 1 mmol/L (ref 0.0–2.0)
Acid-base deficit: 3 mmol/L — ABNORMAL HIGH (ref 0.0–2.0)
Acid-base deficit: 2 mmol/L (ref 0.0–2.0)
Acid-base deficit: 3 mmol/L — ABNORMAL HIGH (ref 0.0–2.0)
Bicarbonate: 28.4 mmol/L — ABNORMAL HIGH (ref 20.0–28.0)
Bicarbonate: 26.2 mmol/L (ref 20.0–28.0)
Bicarbonate: 25.6 mmol/L (ref 20.0–28.0)
Bicarbonate: 24.6 mmol/L (ref 20.0–28.0)
Bicarbonate: 24.5 mmol/L (ref 20.0–28.0)
Bicarbonate: 22.5 mmol/L (ref 20.0–28.0)
Bicarbonate: 24.1 mmol/L (ref 20.0–28.0)
Bicarbonate: 21.9 mmol/L (ref 20.0–28.0)
Calcium, Ion: 1.13 mmol/L — ABNORMAL LOW (ref 1.15–1.40)
Calcium, Ion: 0.97 mmol/L — ABNORMAL LOW (ref 1.15–1.40)
Calcium, Ion: 1.09 mmol/L — ABNORMAL LOW (ref 1.15–1.40)
Calcium, Ion: 1.44 mmol/L — ABNORMAL HIGH (ref 1.15–1.40)
Calcium, Ion: 1.02 mmol/L — ABNORMAL LOW (ref 1.15–1.40)
Calcium, Ion: 1.32 mmol/L (ref 1.15–1.40)
Calcium, Ion: 1.31 mmol/L (ref 1.15–1.40)
Calcium, Ion: 1.25 mmol/L (ref 1.15–1.40)
HCT: 20 % — ABNORMAL LOW (ref 39.0–52.0)
HCT: 29 % — ABNORMAL LOW (ref 39.0–52.0)
HCT: 25 % — ABNORMAL LOW (ref 39.0–52.0)
HCT: 24 % — ABNORMAL LOW (ref 39.0–52.0)
HCT: 26 % — ABNORMAL LOW (ref 39.0–52.0)
HCT: 32 % — ABNORMAL LOW (ref 39.0–52.0)
HCT: 28 % — ABNORMAL LOW (ref 39.0–52.0)
HCT: 23 % — ABNORMAL LOW (ref 39.0–52.0)
Hemoglobin: 6.8 g/dL — CL (ref 13.0–17.0)
Hemoglobin: 9.9 g/dL — ABNORMAL LOW (ref 13.0–17.0)
Hemoglobin: 8.5 g/dL — ABNORMAL LOW (ref 13.0–17.0)
Hemoglobin: 8.2 g/dL — ABNORMAL LOW (ref 13.0–17.0)
Hemoglobin: 8.8 g/dL — ABNORMAL LOW (ref 13.0–17.0)
Hemoglobin: 10.9 g/dL — ABNORMAL LOW (ref 13.0–17.0)
Hemoglobin: 9.5 g/dL — ABNORMAL LOW (ref 13.0–17.0)
Hemoglobin: 7.8 g/dL — ABNORMAL LOW (ref 13.0–17.0)
O2 Saturation: 100 %
O2 Saturation: 100 %
O2 Saturation: 100 %
O2 Saturation: 99 %
O2 Saturation: 98 %
O2 Saturation: 82 %
O2 Saturation: 100 %
O2 Saturation: 100 %
Patient temperature: 37.2
Patient temperature: 36.2
Patient temperature: 35.5
Potassium: 3.9 mmol/L (ref 3.5–5.1)
Potassium: 3.8 mmol/L (ref 3.5–5.1)
Potassium: 3.7 mmol/L (ref 3.5–5.1)
Potassium: 3.3 mmol/L — ABNORMAL LOW (ref 3.5–5.1)
Potassium: 3.3 mmol/L — ABNORMAL LOW (ref 3.5–5.1)
Potassium: 3.7 mmol/L (ref 3.5–5.1)
Potassium: 3.8 mmol/L (ref 3.5–5.1)
Potassium: 4.1 mmol/L (ref 3.5–5.1)
Sodium: 139 mmol/L (ref 135–145)
Sodium: 139 mmol/L (ref 135–145)
Sodium: 139 mmol/L (ref 135–145)
Sodium: 140 mmol/L (ref 135–145)
Sodium: 141 mmol/L (ref 135–145)
Sodium: 140 mmol/L (ref 135–145)
Sodium: 140 mmol/L (ref 135–145)
Sodium: 140 mmol/L (ref 135–145)
TCO2: 30 mmol/L (ref 22–32)
TCO2: 28 mmol/L (ref 22–32)
TCO2: 27 mmol/L (ref 22–32)
TCO2: 26 mmol/L (ref 22–32)
TCO2: 26 mmol/L (ref 22–32)
TCO2: 24 mmol/L (ref 22–32)
TCO2: 25 mmol/L (ref 22–32)
TCO2: 23 mmol/L (ref 22–32)
pCO2 arterial: 45.1 mmHg (ref 32.0–48.0)
pCO2 arterial: 42.3 mmHg (ref 32.0–48.0)
pCO2 arterial: 38.2 mmHg (ref 32.0–48.0)
pCO2 arterial: 40.9 mmHg (ref 32.0–48.0)
pCO2 arterial: 42 mmHg (ref 32.0–48.0)
pCO2 arterial: 41.5 mmHg (ref 32.0–48.0)
pCO2 arterial: 42.6 mmHg (ref 32.0–48.0)
pCO2 arterial: 37 mmHg (ref 32.0–48.0)
pH, Arterial: 7.408 (ref 7.350–7.450)
pH, Arterial: 7.4 (ref 7.350–7.450)
pH, Arterial: 7.435 (ref 7.350–7.450)
pH, Arterial: 7.387 (ref 7.350–7.450)
pH, Arterial: 7.373 (ref 7.350–7.450)
pH, Arterial: 7.343 — ABNORMAL LOW (ref 7.350–7.450)
pH, Arterial: 7.357 (ref 7.350–7.450)
pH, Arterial: 7.373 (ref 7.350–7.450)
pO2, Arterial: 304 mmHg — ABNORMAL HIGH (ref 83.0–108.0)
pO2, Arterial: 268 mmHg — ABNORMAL HIGH (ref 83.0–108.0)
pO2, Arterial: 374 mmHg — ABNORMAL HIGH (ref 83.0–108.0)
pO2, Arterial: 162 mmHg — ABNORMAL HIGH (ref 83.0–108.0)
pO2, Arterial: 114 mmHg — ABNORMAL HIGH (ref 83.0–108.0)
pO2, Arterial: 50 mmHg — ABNORMAL LOW (ref 83.0–108.0)
pO2, Arterial: 345 mmHg — ABNORMAL HIGH (ref 83.0–108.0)
pO2, Arterial: 185 mmHg — ABNORMAL HIGH (ref 83.0–108.0)

## 2020-10-13 LAB — POCT I-STAT EG7
Acid-Base Excess: 2 mmol/L (ref 0.0–2.0)
Acid-Base Excess: 0 mmol/L (ref 0.0–2.0)
Bicarbonate: 27.2 mmol/L (ref 20.0–28.0)
Bicarbonate: 25.2 mmol/L (ref 20.0–28.0)
Calcium, Ion: 1.16 mmol/L (ref 1.15–1.40)
Calcium, Ion: 1.12 mmol/L — ABNORMAL LOW (ref 1.15–1.40)
HCT: 20 % — ABNORMAL LOW (ref 39.0–52.0)
HCT: 25 % — ABNORMAL LOW (ref 39.0–52.0)
Hemoglobin: 6.8 g/dL — CL (ref 13.0–17.0)
Hemoglobin: 8.5 g/dL — ABNORMAL LOW (ref 13.0–17.0)
O2 Saturation: 66 %
O2 Saturation: 53 %
Potassium: 4.2 mmol/L (ref 3.5–5.1)
Potassium: 4.4 mmol/L (ref 3.5–5.1)
Sodium: 139 mmol/L (ref 135–145)
Sodium: 138 mmol/L (ref 135–145)
TCO2: 29 mmol/L (ref 22–32)
TCO2: 27 mmol/L (ref 22–32)
pCO2, Ven: 47.7 mmHg (ref 44.0–60.0)
pCO2, Ven: 44.4 mmHg (ref 44.0–60.0)
pH, Ven: 7.364 (ref 7.250–7.430)
pH, Ven: 7.363 (ref 7.250–7.430)
pO2, Ven: 36 mmHg (ref 32.0–45.0)
pO2, Ven: 29 mmHg — CL (ref 32.0–45.0)

## 2020-10-13 LAB — BASIC METABOLIC PANEL
Anion gap: 5 (ref 5–15)
Anion gap: 8 (ref 5–15)
Anion gap: 7 (ref 5–15)
BUN: 29 mg/dL — ABNORMAL HIGH (ref 6–20)
BUN: 25 mg/dL — ABNORMAL HIGH (ref 6–20)
BUN: 25 mg/dL — ABNORMAL HIGH (ref 6–20)
CO2: 28 mmol/L (ref 22–32)
CO2: 23 mmol/L (ref 22–32)
CO2: 22 mmol/L (ref 22–32)
Calcium: 8.5 mg/dL — ABNORMAL LOW (ref 8.9–10.3)
Calcium: 8.8 mg/dL — ABNORMAL LOW (ref 8.9–10.3)
Calcium: 8.6 mg/dL — ABNORMAL LOW (ref 8.9–10.3)
Chloride: 101 mmol/L (ref 98–111)
Chloride: 105 mmol/L (ref 98–111)
Chloride: 107 mmol/L (ref 98–111)
Creatinine, Ser: 1.96 mg/dL — ABNORMAL HIGH (ref 0.61–1.24)
Creatinine, Ser: 1.77 mg/dL — ABNORMAL HIGH (ref 0.61–1.24)
Creatinine, Ser: 1.75 mg/dL — ABNORMAL HIGH (ref 0.61–1.24)
GFR, Estimated: 39 mL/min — ABNORMAL LOW (ref >60)
GFR, Estimated: 44 mL/min — ABNORMAL LOW (ref >60)
GFR, Estimated: 44 mL/min — ABNORMAL LOW (ref >60)
Glucose, Bld: 269 mg/dL — ABNORMAL HIGH (ref 70–99)
Glucose, Bld: 144 mg/dL — ABNORMAL HIGH (ref 70–99)
Glucose, Bld: 133 mg/dL — ABNORMAL HIGH (ref 70–99)
Potassium: 4 mmol/L (ref 3.5–5.1)
Potassium: 3.6 mmol/L (ref 3.5–5.1)
Potassium: 4.1 mmol/L (ref 3.5–5.1)
Sodium: 134 mmol/L — ABNORMAL LOW (ref 135–145)
Sodium: 136 mmol/L (ref 135–145)
Sodium: 136 mmol/L (ref 135–145)

## 2020-10-13 LAB — GLUCOSE, CAPILLARY
Glucose-Capillary: 219 mg/dL — ABNORMAL HIGH (ref 70–99)
Glucose-Capillary: 177 mg/dL — ABNORMAL HIGH (ref 70–99)
Glucose-Capillary: 137 mg/dL — ABNORMAL HIGH (ref 70–99)
Glucose-Capillary: 141 mg/dL — ABNORMAL HIGH (ref 70–99)
Glucose-Capillary: 157 mg/dL — ABNORMAL HIGH (ref 70–99)
Glucose-Capillary: 102 mg/dL — ABNORMAL HIGH (ref 70–99)
Glucose-Capillary: 127 mg/dL — ABNORMAL HIGH (ref 70–99)
Glucose-Capillary: 136 mg/dL — ABNORMAL HIGH (ref 70–99)
Glucose-Capillary: 138 mg/dL — ABNORMAL HIGH (ref 70–99)
Glucose-Capillary: 118 mg/dL — ABNORMAL HIGH (ref 70–99)

## 2020-10-13 LAB — CBC
HCT: 27.6 % — ABNORMAL LOW (ref 39.0–52.0)
HCT: 30.1 % — ABNORMAL LOW (ref 39.0–52.0)
HCT: 22.3 % — ABNORMAL LOW (ref 39.0–52.0)
Hemoglobin: 8.2 g/dL — ABNORMAL LOW (ref 13.0–17.0)
Hemoglobin: 9.5 g/dL — ABNORMAL LOW (ref 13.0–17.0)
Hemoglobin: 7.3 g/dL — ABNORMAL LOW (ref 13.0–17.0)
MCH: 24.6 pg — ABNORMAL LOW (ref 26.0–34.0)
MCH: 26.9 pg (ref 26.0–34.0)
MCH: 27 pg (ref 26.0–34.0)
MCHC: 29.7 g/dL — ABNORMAL LOW (ref 30.0–36.0)
MCHC: 31.6 g/dL (ref 30.0–36.0)
MCHC: 32.7 g/dL (ref 30.0–36.0)
MCV: 82.6 fL (ref 80.0–100.0)
MCV: 85.3 fL (ref 80.0–100.0)
MCV: 82.6 fL (ref 80.0–100.0)
Platelets: 196 10*3/uL (ref 150–400)
Platelets: 130 10*3/uL — ABNORMAL LOW (ref 150–400)
Platelets: 119 10*3/uL — ABNORMAL LOW (ref 150–400)
RBC: 3.34 MIL/uL — ABNORMAL LOW (ref 4.22–5.81)
RBC: 3.53 MIL/uL — ABNORMAL LOW (ref 4.22–5.81)
RBC: 2.7 MIL/uL — ABNORMAL LOW (ref 4.22–5.81)
RDW: 19 % — ABNORMAL HIGH (ref 11.5–15.5)
RDW: 16.6 % — ABNORMAL HIGH (ref 11.5–15.5)
RDW: 16.6 % — ABNORMAL HIGH (ref 11.5–15.5)
WBC: 14.1 10*3/uL — ABNORMAL HIGH (ref 4.0–10.5)
WBC: 29.4 10*3/uL — ABNORMAL HIGH (ref 4.0–10.5)
WBC: 22 10*3/uL — ABNORMAL HIGH (ref 4.0–10.5)
nRBC: 0.1 % (ref 0.0–0.2)
nRBC: 0.5 % — ABNORMAL HIGH (ref 0.0–0.2)
nRBC: 0.7 % — ABNORMAL HIGH (ref 0.0–0.2)

## 2020-10-13 LAB — POCT I-STAT, CHEM 8
BUN: 28 mg/dL — ABNORMAL HIGH (ref 6–20)
BUN: 26 mg/dL — ABNORMAL HIGH (ref 6–20)
BUN: 27 mg/dL — ABNORMAL HIGH (ref 6–20)
BUN: 28 mg/dL — ABNORMAL HIGH (ref 6–20)
BUN: 28 mg/dL — ABNORMAL HIGH (ref 6–20)
BUN: 28 mg/dL — ABNORMAL HIGH (ref 6–20)
Calcium, Ion: 1.24 mmol/L (ref 1.15–1.40)
Calcium, Ion: 1.09 mmol/L — ABNORMAL LOW (ref 1.15–1.40)
Calcium, Ion: 1.23 mmol/L (ref 1.15–1.40)
Calcium, Ion: 1.12 mmol/L — ABNORMAL LOW (ref 1.15–1.40)
Calcium, Ion: 1.08 mmol/L — ABNORMAL LOW (ref 1.15–1.40)
Calcium, Ion: 1.44 mmol/L — ABNORMAL HIGH (ref 1.15–1.40)
Chloride: 101 mmol/L (ref 98–111)
Chloride: 100 mmol/L (ref 98–111)
Chloride: 101 mmol/L (ref 98–111)
Chloride: 100 mmol/L (ref 98–111)
Chloride: 100 mmol/L (ref 98–111)
Chloride: 101 mmol/L (ref 98–111)
Creatinine, Ser: 1.7 mg/dL — ABNORMAL HIGH (ref 0.61–1.24)
Creatinine, Ser: 1.6 mg/dL — ABNORMAL HIGH (ref 0.61–1.24)
Creatinine, Ser: 1.6 mg/dL — ABNORMAL HIGH (ref 0.61–1.24)
Creatinine, Ser: 1.6 mg/dL — ABNORMAL HIGH (ref 0.61–1.24)
Creatinine, Ser: 1.6 mg/dL — ABNORMAL HIGH (ref 0.61–1.24)
Creatinine, Ser: 1.6 mg/dL — ABNORMAL HIGH (ref 0.61–1.24)
Glucose, Bld: 237 mg/dL — ABNORMAL HIGH (ref 70–99)
Glucose, Bld: 181 mg/dL — ABNORMAL HIGH (ref 70–99)
Glucose, Bld: 232 mg/dL — ABNORMAL HIGH (ref 70–99)
Glucose, Bld: 246 mg/dL — ABNORMAL HIGH (ref 70–99)
Glucose, Bld: 229 mg/dL — ABNORMAL HIGH (ref 70–99)
Glucose, Bld: 186 mg/dL — ABNORMAL HIGH (ref 70–99)
HCT: 28 % — ABNORMAL LOW (ref 39.0–52.0)
HCT: 22 % — ABNORMAL LOW (ref 39.0–52.0)
HCT: 25 % — ABNORMAL LOW (ref 39.0–52.0)
HCT: 27 % — ABNORMAL LOW (ref 39.0–52.0)
HCT: 26 % — ABNORMAL LOW (ref 39.0–52.0)
HCT: 26 % — ABNORMAL LOW (ref 39.0–52.0)
Hemoglobin: 9.5 g/dL — ABNORMAL LOW (ref 13.0–17.0)
Hemoglobin: 7.5 g/dL — ABNORMAL LOW (ref 13.0–17.0)
Hemoglobin: 8.5 g/dL — ABNORMAL LOW (ref 13.0–17.0)
Hemoglobin: 9.2 g/dL — ABNORMAL LOW (ref 13.0–17.0)
Hemoglobin: 8.8 g/dL — ABNORMAL LOW (ref 13.0–17.0)
Hemoglobin: 8.8 g/dL — ABNORMAL LOW (ref 13.0–17.0)
Potassium: 4.1 mmol/L (ref 3.5–5.1)
Potassium: 3.9 mmol/L (ref 3.5–5.1)
Potassium: 4.5 mmol/L (ref 3.5–5.1)
Potassium: 3.9 mmol/L (ref 3.5–5.1)
Potassium: 3.7 mmol/L (ref 3.5–5.1)
Potassium: 3.3 mmol/L — ABNORMAL LOW (ref 3.5–5.1)
Sodium: 137 mmol/L (ref 135–145)
Sodium: 137 mmol/L (ref 135–145)
Sodium: 138 mmol/L (ref 135–145)
Sodium: 138 mmol/L (ref 135–145)
Sodium: 138 mmol/L (ref 135–145)
Sodium: 139 mmol/L (ref 135–145)
TCO2: 27 mmol/L (ref 22–32)
TCO2: 26 mmol/L (ref 22–32)
TCO2: 27 mmol/L (ref 22–32)
TCO2: 28 mmol/L (ref 22–32)
TCO2: 28 mmol/L (ref 22–32)
TCO2: 25 mmol/L (ref 22–32)

## 2020-10-13 LAB — COOXEMETRY PANEL
Carboxyhemoglobin: 2.2 % — ABNORMAL HIGH (ref 0.5–1.5)
Carboxyhemoglobin: 1.7 % — ABNORMAL HIGH (ref 0.5–1.5)
Methemoglobin: 0.9 % (ref 0.0–1.5)
Methemoglobin: 1.2 % (ref 0.0–1.5)
O2 Saturation: 59.2 %
O2 Saturation: 56.8 %
Total hemoglobin: 11.6 g/dL — ABNORMAL LOW (ref 12.0–16.0)
Total hemoglobin: 9.5 g/dL — ABNORMAL LOW (ref 12.0–16.0)

## 2020-10-13 LAB — HEMOGLOBIN AND HEMATOCRIT, BLOOD
HCT: 22.8 % — ABNORMAL LOW (ref 39.0–52.0)
Hemoglobin: 7.1 g/dL — ABNORMAL LOW (ref 13.0–17.0)

## 2020-10-13 LAB — DIC (DISSEMINATED INTRAVASCULAR COAGULATION)PANEL
D-Dimer, Quant: 0.88 ug/mL-FEU — ABNORMAL HIGH (ref 0.00–0.50)
Fibrinogen: 335 mg/dL (ref 210–475)
INR: 1.5 — ABNORMAL HIGH (ref 0.8–1.2)
Platelets: 123 10*3/uL — ABNORMAL LOW (ref 150–400)
Prothrombin Time: 17.9 seconds — ABNORMAL HIGH (ref 11.4–15.2)
Smear Review: NONE SEEN
aPTT: 38 seconds — ABNORMAL HIGH (ref 24–36)

## 2020-10-13 LAB — MAGNESIUM
Magnesium: 1.8 mg/dL (ref 1.7–2.4)
Magnesium: 2.6 mg/dL — ABNORMAL HIGH (ref 1.7–2.4)

## 2020-10-13 LAB — PROTIME-INR
INR: 1.6 — ABNORMAL HIGH (ref 0.8–1.2)
Prothrombin Time: 18.3 seconds — ABNORMAL HIGH (ref 11.4–15.2)

## 2020-10-13 LAB — LACTATE DEHYDROGENASE: LDH: 293 U/L — ABNORMAL HIGH (ref 98–192)

## 2020-10-13 LAB — APTT: aPTT: 63 seconds — ABNORMAL HIGH (ref 24–36)

## 2020-10-13 LAB — PLATELET COUNT: Platelets: 133 10*3/uL — ABNORMAL LOW (ref 150–400)

## 2020-10-13 LAB — FIBRINOGEN: Fibrinogen: 413 mg/dL (ref 210–475)

## 2020-10-13 LAB — PREPARE RBC (CROSSMATCH)

## 2020-10-13 SURGERY — REDO STERNOTOMY
Anesthesia: General | Site: Chest

## 2020-10-13 MED ORDER — PANTOPRAZOLE SODIUM 40 MG PO TBEC: 40.0000 mg | DELAYED_RELEASE_TABLET | Freq: Every day | ORAL | Status: DC

## 2020-10-13 MED ORDER — SODIUM CHLORIDE 0.9% FLUSH
3.0000 mL | Freq: Two times a day (BID) | INTRAVENOUS | Status: DC
  Administered 2020-10-19 – 2020-10-22 (×3): 3 mL via INTRAVENOUS

## 2020-10-13 MED ORDER — LACTATED RINGERS IV SOLN: INTRAVENOUS | Status: DC | PRN

## 2020-10-13 MED ORDER — ACETAMINOPHEN 160 MG/5ML PO SOLN
1000.0000 mg | Freq: Four times a day (QID) | ORAL | Status: AC
  Administered 2020-10-14 – 2020-10-18 (×5): 1000 mg
  Filled 2020-10-13 (×7): qty 40.6

## 2020-10-13 MED ORDER — MAGNESIUM SULFATE 4 GM/100ML IV SOLN: 4.0000 g | Freq: Once | INTRAVENOUS | Status: AC

## 2020-10-13 MED ORDER — MIDAZOLAM HCL (PF) 10 MG/2ML IJ SOLN
INTRAMUSCULAR | Status: AC
  Filled 2020-10-13: qty 2

## 2020-10-13 MED ORDER — FENTANYL CITRATE (PF) 250 MCG/5ML IJ SOLN
INTRAMUSCULAR | Status: AC
  Filled 2020-10-13: qty 25

## 2020-10-13 MED ORDER — ARTIFICIAL TEARS OPHTHALMIC OINT
TOPICAL_OINTMENT | OPHTHALMIC | Status: DC | PRN
  Administered 2020-10-13: 1 via OPHTHALMIC

## 2020-10-13 MED ORDER — SODIUM CHLORIDE 0.9% IV SOLUTION: Freq: Once | INTRAVENOUS | Status: AC

## 2020-10-13 MED ORDER — HEPARIN SODIUM (PORCINE) 1000 UNIT/ML IJ SOLN
INTRAMUSCULAR | Status: AC
  Filled 2020-10-13: qty 1

## 2020-10-13 MED ORDER — LACTATED RINGERS IV SOLN: INTRAVENOUS | Status: DC

## 2020-10-13 MED ORDER — PROPOFOL 10 MG/ML IV BOLUS
INTRAVENOUS | Status: DC | PRN
  Administered 2020-10-13: 30 mg via INTRAVENOUS

## 2020-10-13 MED ORDER — VASOPRESSIN 20 UNITS/100 ML INFUSION FOR SHOCK
0.0000 [IU]/min | INTRAVENOUS | Status: DC
  Administered 2020-10-13 – 2020-10-16 (×6): 0.03 [IU]/min via INTRAVENOUS
  Administered 2020-10-16 – 2020-10-18 (×4): 0.02 [IU]/min via INTRAVENOUS
  Filled 2020-10-13 (×9): qty 100

## 2020-10-13 MED ORDER — FENTANYL CITRATE (PF) 100 MCG/2ML IJ SOLN
INTRAMUSCULAR | Status: DC | PRN
  Administered 2020-10-13 (×2): 100 ug via INTRAVENOUS
  Administered 2020-10-13 (×2): 150 ug via INTRAVENOUS

## 2020-10-13 MED ORDER — DEXTROSE 50 % IV SOLN
0.0000 mL | INTRAVENOUS | Status: DC | PRN
  Administered 2020-10-17: 50 mL via INTRAVENOUS
  Filled 2020-10-13 (×2): qty 50

## 2020-10-13 MED ORDER — SODIUM CHLORIDE 0.9 % IV SOLN: INTRAVENOUS | Status: DC | PRN

## 2020-10-13 MED ORDER — LACTATED RINGERS IV SOLN: 500.0000 mL | Freq: Once | INTRAVENOUS | Status: DC | PRN

## 2020-10-13 MED ORDER — BISACODYL 10 MG RE SUPP
10.0000 mg | Freq: Every day | RECTAL | Status: DC
  Administered 2020-10-14: 10 mg via RECTAL
  Filled 2020-10-13: qty 1

## 2020-10-13 MED ORDER — INSULIN REGULAR(HUMAN) IN NACL 100-0.9 UT/100ML-% IV SOLN
INTRAVENOUS | Status: DC
  Administered 2020-10-13: 8 [IU]/h via INTRAVENOUS
  Administered 2020-10-13: 6 [IU]/h via INTRAVENOUS
  Administered 2020-10-14: 2.4 [IU]/h via INTRAVENOUS
  Filled 2020-10-13: qty 100

## 2020-10-13 MED ORDER — PROTAMINE SULFATE 10 MG/ML IV SOLN
INTRAVENOUS | Status: DC | PRN
  Administered 2020-10-13: 20 mg via INTRAVENOUS
  Administered 2020-10-13: 220 mg via INTRAVENOUS

## 2020-10-13 MED ORDER — 0.9 % SODIUM CHLORIDE (POUR BTL) OPTIME
TOPICAL | Status: DC | PRN
  Administered 2020-10-13: 5000 mL

## 2020-10-13 MED ORDER — SODIUM CHLORIDE 0.9 % IV SOLN
1.5000 g | Freq: Two times a day (BID) | INTRAVENOUS | Status: AC
  Administered 2020-10-13 – 2020-10-15 (×4): 1.5 g via INTRAVENOUS
  Filled 2020-10-13 (×4): qty 1.5

## 2020-10-13 MED ORDER — ACETAMINOPHEN 160 MG/5ML PO SOLN: 650.0000 mg | Freq: Once | ORAL | Status: AC

## 2020-10-13 MED ORDER — NOREPINEPHRINE 4 MG/250ML-% IV SOLN: 0.0000 ug/min | INTRAVENOUS | Status: DC

## 2020-10-13 MED ORDER — SODIUM CHLORIDE 0.9% FLUSH: 3.0000 mL | INTRAVENOUS | Status: DC | PRN

## 2020-10-13 MED ORDER — DOCUSATE SODIUM 100 MG PO CAPS: 200.0000 mg | ORAL_CAPSULE | Freq: Every day | ORAL | Status: DC

## 2020-10-13 MED ORDER — HEMOSTATIC AGENTS (NO CHARGE) OPTIME
TOPICAL | Status: DC | PRN
  Administered 2020-10-13 (×2): 1 via TOPICAL

## 2020-10-13 MED ORDER — PROPOFOL 10 MG/ML IV BOLUS
INTRAVENOUS | Status: AC
  Filled 2020-10-13: qty 20

## 2020-10-13 MED ORDER — SODIUM CHLORIDE (PF) 0.9 % IJ SOLN
OROMUCOSAL | Status: DC | PRN
  Administered 2020-10-13 (×7): 4 mL via TOPICAL

## 2020-10-13 MED ORDER — MIDAZOLAM HCL (PF) 5 MG/ML IJ SOLN
INTRAMUSCULAR | Status: DC | PRN
  Administered 2020-10-13 (×2): 2 mg via INTRAVENOUS

## 2020-10-13 MED ORDER — ALBUMIN HUMAN 5 % IV SOLN: INTRAVENOUS | Status: DC | PRN

## 2020-10-13 MED ORDER — CHLORHEXIDINE GLUCONATE CLOTH 2 % EX PADS
6.0000 | MEDICATED_PAD | Freq: Every day | CUTANEOUS | Status: DC
  Administered 2020-10-14 – 2020-11-12 (×29): 6 via TOPICAL

## 2020-10-13 MED ORDER — ROCURONIUM BROMIDE 10 MG/ML (PF) SYRINGE
PREFILLED_SYRINGE | INTRAVENOUS | Status: DC | PRN
  Administered 2020-10-13: 20 mg via INTRAVENOUS
  Administered 2020-10-13: 50 mg via INTRAVENOUS
  Administered 2020-10-13: 100 mg via INTRAVENOUS
  Administered 2020-10-13: 80 mg via INTRAVENOUS
  Administered 2020-10-13 (×2): 50 mg via INTRAVENOUS

## 2020-10-13 MED ORDER — ASPIRIN EC 325 MG PO TBEC
325.0000 mg | DELAYED_RELEASE_TABLET | Freq: Every day | ORAL | Status: DC
  Administered 2020-10-16 – 2020-10-24 (×6): 325 mg via ORAL
  Filled 2020-10-13 (×7): qty 1

## 2020-10-13 MED ORDER — ACETAMINOPHEN 500 MG PO TABS
1000.0000 mg | ORAL_TABLET | Freq: Four times a day (QID) | ORAL | Status: AC
  Administered 2020-10-14 – 2020-10-17 (×5): 1000 mg via ORAL
  Filled 2020-10-13 (×6): qty 2

## 2020-10-13 MED ORDER — ASPIRIN 81 MG PO CHEW
324.0000 mg | CHEWABLE_TABLET | Freq: Every day | ORAL | Status: DC
  Administered 2020-10-14 – 2020-10-20 (×4): 324 mg
  Filled 2020-10-13 (×4): qty 4

## 2020-10-13 MED ORDER — CALCIUM CHLORIDE 10 % IV SOLN
INTRAVENOUS | Status: DC | PRN
  Administered 2020-10-13: 250 mg via INTRAVENOUS
  Administered 2020-10-13: 300 mg via INTRAVENOUS
  Administered 2020-10-13 (×3): 250 mg via INTRAVENOUS

## 2020-10-13 MED ORDER — ALBUMIN HUMAN 5 % IV SOLN
250.0000 mL | INTRAVENOUS | Status: AC | PRN
  Administered 2020-10-13 (×3): 12.5 g via INTRAVENOUS
  Filled 2020-10-13: qty 250

## 2020-10-13 MED ORDER — MORPHINE SULFATE (PF) 2 MG/ML IV SOLN
1.0000 mg | INTRAVENOUS | Status: DC | PRN
  Administered 2020-10-14 – 2020-10-16 (×12): 2 mg via INTRAVENOUS
  Filled 2020-10-13 (×12): qty 1

## 2020-10-13 MED ORDER — ASPIRIN 300 MG RE SUPP: 300.0000 mg | Freq: Every day | RECTAL | Status: DC

## 2020-10-13 MED ORDER — VANCOMYCIN HCL 1500 MG/300ML IV SOLN
1500.0000 mg | INTRAVENOUS | Status: AC
  Administered 2020-10-14 – 2020-10-15 (×2): 1500 mg via INTRAVENOUS
  Filled 2020-10-13 (×3): qty 300

## 2020-10-13 MED ORDER — EPINEPHRINE HCL 5 MG/250ML IV SOLN IN NS
1.0000 ug/min | INTRAVENOUS | Status: DC
  Administered 2020-10-13 – 2020-10-16 (×7): 7 ug/min via INTRAVENOUS
  Administered 2020-10-17 – 2020-10-18 (×5): 6 ug/min via INTRAVENOUS
  Administered 2020-10-19 (×2): 5 ug/min via INTRAVENOUS
  Administered 2020-10-20: 4 ug/min via INTRAVENOUS
  Administered 2020-10-20: 5 ug/min via INTRAVENOUS
  Administered 2020-10-21 – 2020-10-22 (×2): 4 ug/min via INTRAVENOUS
  Administered 2020-10-22 – 2020-10-24 (×4): 5 ug/min via INTRAVENOUS
  Administered 2020-10-24: 4 ug/min via INTRAVENOUS
  Administered 2020-10-25 – 2020-10-26 (×2): 3 ug/min via INTRAVENOUS
  Administered 2020-10-27: 2 ug/min via INTRAVENOUS
  Filled 2020-10-13 (×26): qty 250

## 2020-10-13 MED ORDER — ACETAMINOPHEN 650 MG RE SUPP
650.0000 mg | Freq: Once | RECTAL | Status: AC
  Administered 2020-10-13: 650 mg via RECTAL

## 2020-10-13 MED ORDER — ORAL CARE MOUTH RINSE
15.0000 mL | OROMUCOSAL | Status: DC
  Administered 2020-10-13 – 2020-10-14 (×10): 15 mL via OROMUCOSAL

## 2020-10-13 MED ORDER — PLASMA-LYTE 148 IV SOLN
INTRAVENOUS | Status: DC | PRN
  Administered 2020-10-13: 500 mL via INTRAVASCULAR

## 2020-10-13 MED ORDER — FLUCONAZOLE IN SODIUM CHLORIDE 400-0.9 MG/200ML-% IV SOLN
400.0000 mg | Freq: Once | INTRAVENOUS | Status: AC
  Administered 2020-10-14: 400 mg via INTRAVENOUS
  Filled 2020-10-13: qty 200

## 2020-10-13 MED ORDER — FAMOTIDINE IN NACL 20-0.9 MG/50ML-% IV SOLN
INTRAVENOUS | Status: AC
  Administered 2020-10-13: 20 mg via INTRAVENOUS
  Filled 2020-10-13: qty 50

## 2020-10-13 MED ORDER — FAMOTIDINE IN NACL 20-0.9 MG/50ML-% IV SOLN
20.0000 mg | Freq: Two times a day (BID) | INTRAVENOUS | Status: AC
  Administered 2020-10-13: 20 mg via INTRAVENOUS
  Filled 2020-10-13: qty 50

## 2020-10-13 MED ORDER — MAGNESIUM SULFATE 4 GM/100ML IV SOLN
INTRAVENOUS | Status: AC
  Administered 2020-10-13: 4 g via INTRAVENOUS
  Filled 2020-10-13: qty 100

## 2020-10-13 MED ORDER — OXYCODONE HCL 5 MG PO TABS: 5.0000 mg | ORAL_TABLET | ORAL | Status: DC | PRN

## 2020-10-13 MED ORDER — CHLORHEXIDINE GLUCONATE 0.12% ORAL RINSE (MEDLINE KIT)
15.0000 mL | Freq: Two times a day (BID) | OROMUCOSAL | Status: DC
  Administered 2020-10-13 – 2020-10-14 (×2): 15 mL via OROMUCOSAL

## 2020-10-13 MED ORDER — MIDAZOLAM HCL 2 MG/2ML IJ SOLN
2.0000 mg | INTRAMUSCULAR | Status: DC | PRN
  Administered 2020-10-14 (×4): 2 mg via INTRAVENOUS
  Filled 2020-10-13 (×4): qty 2

## 2020-10-13 MED ORDER — SUCCINYLCHOLINE CHLORIDE 200 MG/10ML IV SOSY
PREFILLED_SYRINGE | INTRAVENOUS | Status: AC
  Filled 2020-10-13: qty 10

## 2020-10-13 MED ORDER — VANCOMYCIN HCL 1000 MG IV SOLR
INTRAVENOUS | Status: DC | PRN
  Administered 2020-10-13: 1000 mg

## 2020-10-13 MED ORDER — SODIUM CHLORIDE 0.45 % IV SOLN: INTRAVENOUS | Status: DC | PRN

## 2020-10-13 MED ORDER — SODIUM CHLORIDE 0.9 % IV SOLN
600.0000 mg | Freq: Once | INTRAVENOUS | Status: AC
  Administered 2020-10-14: 600 mg via INTRAVENOUS
  Filled 2020-10-13: qty 600

## 2020-10-13 MED ORDER — SODIUM CHLORIDE 0.9 % IV SOLN: INTRAVENOUS | Status: DC

## 2020-10-13 MED ORDER — VANCOMYCIN HCL IN DEXTROSE 1-5 GM/200ML-% IV SOLN: 1000.0000 mg | Freq: Two times a day (BID) | INTRAVENOUS | Status: DC

## 2020-10-13 MED ORDER — ROCURONIUM BROMIDE 10 MG/ML (PF) SYRINGE
PREFILLED_SYRINGE | INTRAVENOUS | Status: AC
  Filled 2020-10-13: qty 10

## 2020-10-13 MED ORDER — POTASSIUM CHLORIDE 10 MEQ/50ML IV SOLN
10.0000 meq | INTRAVENOUS | Status: AC
  Administered 2020-10-13 (×3): 10 meq via INTRAVENOUS
  Filled 2020-10-13: qty 50

## 2020-10-13 MED ORDER — TRAMADOL HCL 50 MG PO TABS: 50.0000 mg | ORAL_TABLET | ORAL | Status: DC | PRN

## 2020-10-13 MED ORDER — ETOMIDATE 2 MG/ML IV SOLN
INTRAVENOUS | Status: AC
  Filled 2020-10-13: qty 10

## 2020-10-13 MED ORDER — NOREPINEPHRINE 16 MG/250ML-% IV SOLN
0.0000 ug/min | INTRAVENOUS | Status: DC
  Administered 2020-10-13: 18 ug/min via INTRAVENOUS
  Administered 2020-10-14: 16 ug/min via INTRAVENOUS
  Administered 2020-10-15: 13 ug/min via INTRAVENOUS
  Administered 2020-10-16: 12 ug/min via INTRAVENOUS
  Administered 2020-10-17 (×2): 10 ug/min via INTRAVENOUS
  Filled 2020-10-13 (×7): qty 250

## 2020-10-13 MED ORDER — CHLORHEXIDINE GLUCONATE 0.12 % MT SOLN
15.0000 mL | OROMUCOSAL | Status: AC
  Administered 2020-10-13: 15 mL via OROMUCOSAL

## 2020-10-13 MED ORDER — SODIUM CHLORIDE 0.9 % IV SOLN: 250.0000 mL | INTRAVENOUS | Status: DC

## 2020-10-13 MED ORDER — MILRINONE LACTATE IN DEXTROSE 20-5 MG/100ML-% IV SOLN
0.2500 ug/kg/min | INTRAVENOUS | Status: DC
  Administered 2020-10-13 – 2020-10-17 (×10): 0.25 ug/kg/min via INTRAVENOUS
  Filled 2020-10-13: qty 200
  Filled 2020-10-13 (×7): qty 100

## 2020-10-13 MED ORDER — ONDANSETRON HCL 4 MG/2ML IJ SOLN
4.0000 mg | Freq: Four times a day (QID) | INTRAMUSCULAR | Status: DC | PRN
  Administered 2020-10-14 – 2020-11-12 (×14): 4 mg via INTRAVENOUS
  Filled 2020-10-13 (×14): qty 2

## 2020-10-13 MED ORDER — BISACODYL 5 MG PO TBEC: 10.0000 mg | DELAYED_RELEASE_TABLET | Freq: Every day | ORAL | Status: DC

## 2020-10-13 MED ORDER — HEPARIN SODIUM (PORCINE) 1000 UNIT/ML IJ SOLN
INTRAMUSCULAR | Status: DC | PRN
  Administered 2020-10-13: 28000 [IU] via INTRAVENOUS

## 2020-10-13 MED ORDER — LIDOCAINE 2% (20 MG/ML) 5 ML SYRINGE
INTRAMUSCULAR | Status: AC
  Filled 2020-10-13: qty 5

## 2020-10-13 MED ORDER — DEXMEDETOMIDINE HCL IN NACL 400 MCG/100ML IV SOLN
0.0000 ug/kg/h | INTRAVENOUS | Status: DC
  Administered 2020-10-13: 0.07 ug/kg/h via INTRAVENOUS
  Administered 2020-10-13 – 2020-10-14 (×2): 0.7 ug/kg/h via INTRAVENOUS
  Filled 2020-10-13 (×3): qty 100

## 2020-10-13 SURGICAL SUPPLY — 149 items
ADAPTER CARDIO PERF ANTE/RETRO (ADAPTER) ×3 IMPLANT
ADPR PRFSN 84XANTGRD RTRGD (ADAPTER)
ADPR TBG 2 MALE LL ART (MISCELLANEOUS) ×3
APL PRP 5X4 STRL LF DISP 70% (MISCELLANEOUS) ×3
APL PRP STRL LF DISP 70% ISPRP (MISCELLANEOUS)
APPLICATOR CHLORAPREP 3ML ORNG (MISCELLANEOUS) ×4 IMPLANT
ATTRACTOMAT 16X20 MAGNETIC DRP (DRAPES) ×3 IMPLANT
BAG DECANTER FOR FLEXI CONT (MISCELLANEOUS) ×7 IMPLANT
BLADE CLIPPER SURG (BLADE) ×7 IMPLANT
BLADE CORE FAN STRYKER (BLADE) ×4 IMPLANT
BLADE STERNUM SYSTEM 6 (BLADE) ×6 IMPLANT
BLADE SURG 10 STRL SS (BLADE) IMPLANT
BLADE SURG 12 STRL SS (BLADE) ×3 IMPLANT
BLADE SURG 15 STRL LF DISP TIS (BLADE) ×3 IMPLANT
BLADE SURG 15 STRL SS (BLADE)
CANISTER SUCT 3000ML PPV (MISCELLANEOUS) ×7 IMPLANT
CANN PRFSN 3/8X14X24FR PCFC (MISCELLANEOUS)
CANN PRFSN 3/8XCNCT ST RT ANG (MISCELLANEOUS)
CANNULA ARTERIAL NVNT 3/8 20FR (MISCELLANEOUS) ×4 IMPLANT
CANNULA GUNDRY RCSP 15FR (MISCELLANEOUS) ×3 IMPLANT
CANNULA PRFSN 3/8X14X24FR PCFC (MISCELLANEOUS) IMPLANT
CANNULA PRFSN 3/8XCNCT RT ANG (MISCELLANEOUS) IMPLANT
CANNULA SUMP PERICARDIAL (CANNULA) ×7 IMPLANT
CANNULA VEN MTL TIP RT (MISCELLANEOUS)
CANNULA VENOUS LOW PROF 34X46 (CANNULA) ×3 IMPLANT
CATH FOLEY 2WAY SLVR  5CC 14FR (CATHETERS)
CATH FOLEY 2WAY SLVR 30CC 20FR (CATHETERS) ×1 IMPLANT
CATH FOLEY 2WAY SLVR 5CC 14FR (CATHETERS) ×3 IMPLANT
CATH HEART VENT LEFT (CATHETERS) IMPLANT
CATH ROBINSON RED A/P 18FR (CATHETERS) ×18 IMPLANT
CATH THORACIC 28FR (CATHETERS) IMPLANT
CATH THORACIC 36FR (CATHETERS) ×3 IMPLANT
CATH THORACIC 36FR RT ANG (CATHETERS) ×3 IMPLANT
CHLORAPREP W/TINT 26 (MISCELLANEOUS) ×3 IMPLANT
CNTNR URN SCR LID CUP LEK RST (MISCELLANEOUS) IMPLANT
CONN 1/2X1/2X1/2  BEN (MISCELLANEOUS)
CONN 1/2X1/2X1/2 BEN (MISCELLANEOUS) ×3 IMPLANT
CONN 3/8X1/2 ST GISH (MISCELLANEOUS) ×6 IMPLANT
CONN ST 1/4X3/8  BEN (MISCELLANEOUS)
CONN ST 1/4X3/8 BEN (MISCELLANEOUS) IMPLANT
CONT SPEC 4OZ STRL OR WHT (MISCELLANEOUS) ×4
COVER PROBE W GEL 5X96 (DRAPES) ×1 IMPLANT
COVER SURGICAL LIGHT HANDLE (MISCELLANEOUS) ×7 IMPLANT
DRAIN CHANNEL 28F RND 3/8 FF (WOUND CARE) IMPLANT
DRAIN CHANNEL 32F RND 10.7 FF (WOUND CARE) IMPLANT
DRAIN HEMOVAC 1/8 X 5 (WOUND CARE) ×1 IMPLANT
DRAPE INCISE IOBAN 66X45 STRL (DRAPES) ×8 IMPLANT
DRAPE PERI GROIN 82X75IN TIB (DRAPES) ×4 IMPLANT
DRAPE SLUSH/WARMER DISC (DRAPES) ×7 IMPLANT
DRSG COVADERM 4X14 (GAUZE/BANDAGES/DRESSINGS) ×7 IMPLANT
DRSG COVADERM 4X6 (GAUZE/BANDAGES/DRESSINGS) ×1 IMPLANT
ELECT BLADE 4.0 EZ CLEAN MEGAD (MISCELLANEOUS) ×4
ELECT BLADE 6.5 EXT (BLADE) ×4 IMPLANT
ELECT CAUTERY BLADE 6.4 (BLADE) ×3 IMPLANT
ELECT REM PT RETURN 9FT ADLT (ELECTROSURGICAL) ×8
ELECTRODE BLDE 4.0 EZ CLN MEGD (MISCELLANEOUS) ×3 IMPLANT
ELECTRODE REM PT RTRN 9FT ADLT (ELECTROSURGICAL) ×15 IMPLANT
EVACUATOR SILICONE 100CC (DRAIN) ×1 IMPLANT
FELT TEFLON 1X6 (MISCELLANEOUS) ×8 IMPLANT
FELT TEFLON 6X6 (MISCELLANEOUS) ×3 IMPLANT
GAUZE SPONGE 4X4 12PLY STRL (GAUZE/BANDAGES/DRESSINGS) ×14 IMPLANT
GLOVE BIO SURGEON STRL SZ 6 (GLOVE) IMPLANT
GLOVE BIO SURGEON STRL SZ 6.5 (GLOVE) IMPLANT
GLOVE BIO SURGEON STRL SZ7 (GLOVE) IMPLANT
GLOVE BIO SURGEON STRL SZ7.5 (GLOVE) IMPLANT
GLOVE BIOGEL M 6.5 STRL (GLOVE) ×3 IMPLANT
GLOVE SS BIOGEL STRL SZ 6.5 (GLOVE) IMPLANT
GLOVE SUPERSENSE BIOGEL SZ 6.5 (GLOVE) ×1
GLOVE TRIUMPH SURG SIZE 7.0 (KITS) ×8 IMPLANT
GOWN STRL REUS W/ TWL LRG LVL3 (GOWN DISPOSABLE) ×24 IMPLANT
GOWN STRL REUS W/ TWL XL LVL3 (GOWN DISPOSABLE) ×9 IMPLANT
GOWN STRL REUS W/TWL LRG LVL3 (GOWN DISPOSABLE) ×24
GOWN STRL REUS W/TWL XL LVL3 (GOWN DISPOSABLE) ×4
HEMOSTAT POWDER SURGIFOAM 1G (HEMOSTASIS) ×25 IMPLANT
HEMOSTAT SURGICEL 2X14 (HEMOSTASIS) ×7 IMPLANT
INSERT FOGARTY SM (MISCELLANEOUS) ×1 IMPLANT
INSERT FOGARTY XLG (MISCELLANEOUS) IMPLANT
IV ADAPTER SYR DOUBLE MALE LL (MISCELLANEOUS) ×1 IMPLANT
KIT BASIN OR (CUSTOM PROCEDURE TRAY) ×7 IMPLANT
KIT CATH CPB BARTLE (MISCELLANEOUS) ×4 IMPLANT
KIT LVAD HEARTMATE 3 W-CNTRL (Prosthesis & Implant Heart) IMPLANT
KIT LVAD HEARTMATE III W-CNTRL (Prosthesis & Implant Heart) ×1 IMPLANT
KIT SUCTION CATH 14FR (SUCTIONS) ×7 IMPLANT
KIT TURNOVER KIT B (KITS) ×7 IMPLANT
LEAD PACING MYOCARDI (MISCELLANEOUS) ×1 IMPLANT
LINE VENT (MISCELLANEOUS) ×1 IMPLANT
NS IRRIG 1000ML POUR BTL (IV SOLUTION) ×35 IMPLANT
PACK E OPEN HEART (SUTURE) ×3 IMPLANT
PACK OPEN HEART (CUSTOM PROCEDURE TRAY) ×7 IMPLANT
PAD ARMBOARD 7.5X6 YLW CONV (MISCELLANEOUS) ×14 IMPLANT
PAD DEFIB R2 (MISCELLANEOUS) ×4 IMPLANT
POSITIONER HEAD DONUT 9IN (MISCELLANEOUS) ×7 IMPLANT
PUNCH AORTIC ROTATE  4.5MM 8IN (MISCELLANEOUS) ×1 IMPLANT
PUNCH AORTIC ROTATE 4.5MM 8IN (MISCELLANEOUS) ×3 IMPLANT
SEALANT SURG COSEAL 8ML (VASCULAR PRODUCTS) ×4 IMPLANT
SET CARDIOPLEGIA MPS 5001102 (MISCELLANEOUS) ×1 IMPLANT
SHEATH AVANTI 11CM 5FR (SHEATH) IMPLANT
SPONGE LAP 18X18 RF (DISPOSABLE) ×5 IMPLANT
SUT BONE WAX W31G (SUTURE) ×4 IMPLANT
SUT ETHIBOND 2 0 SH (SUTURE) ×16 IMPLANT
SUT ETHIBOND 2 0 SH 36X2 (SUTURE) ×15 IMPLANT
SUT ETHIBOND 2 0 V4 (SUTURE) IMPLANT
SUT ETHIBOND 2 0V4 GREEN (SUTURE) IMPLANT
SUT ETHIBOND NAB MH 2-0 36IN (SUTURE) ×50 IMPLANT
SUT ETHILON 3 0 FSL (SUTURE) ×1 IMPLANT
SUT PROLENE 3 0 SH 1 (SUTURE) ×4 IMPLANT
SUT PROLENE 3 0 SH 48 (SUTURE) ×4 IMPLANT
SUT PROLENE 3 0 SH DA (SUTURE) ×7 IMPLANT
SUT PROLENE 4 0 RB 1 (SUTURE) ×16
SUT PROLENE 4 0 SH DA (SUTURE) IMPLANT
SUT PROLENE 4-0 RB1 .5 CRCL 36 (SUTURE) ×18 IMPLANT
SUT PROLENE 5 0 C 1 36 (SUTURE) ×2 IMPLANT
SUT PROLENE 5 0 C1 (SUTURE) IMPLANT
SUT SILK  1 MH (SUTURE) ×16
SUT SILK 1 MH (SUTURE) ×12 IMPLANT
SUT SILK 1 TIES 10X30 (SUTURE) ×4 IMPLANT
SUT SILK 2 0 SH CR/8 (SUTURE) ×8 IMPLANT
SUT SILK 3 0 SH CR/8 (SUTURE) ×1 IMPLANT
SUT STEEL 6MS V (SUTURE) ×6 IMPLANT
SUT STEEL STERNAL CCS#1 18IN (SUTURE) ×1 IMPLANT
SUT STEEL SZ 6 DBL 3X14 BALL (SUTURE) ×3 IMPLANT
SUT TEM PAC WIRE 2 0 SH (SUTURE) ×4 IMPLANT
SUT VIC AB 1 CTX 27 (SUTURE) ×8 IMPLANT
SUT VIC AB 1 CTX 36 (SUTURE)
SUT VIC AB 1 CTX36XBRD ANBCTR (SUTURE) ×6 IMPLANT
SUT VIC AB 2-0 CT1 27 (SUTURE) ×8
SUT VIC AB 2-0 CT1 TAPERPNT 27 (SUTURE) IMPLANT
SUT VIC AB 2-0 CTX 27 (SUTURE) ×8 IMPLANT
SUT VIC AB 3-0 SH 27 (SUTURE) ×12
SUT VIC AB 3-0 SH 27X BRD (SUTURE) IMPLANT
SUT VIC AB 3-0 SH 8-18 (SUTURE) ×3 IMPLANT
SUT VIC AB 3-0 X1 27 (SUTURE) ×10 IMPLANT
SYR 30ML SLIP (SYRINGE) ×1 IMPLANT
SYR 50ML LL SCALE MARK (SYRINGE) ×3 IMPLANT
SYSTEM SAHARA CHEST DRAIN ATS (WOUND CARE) ×8 IMPLANT
TAPE CLOTH SURG 4X10 WHT LF (GAUZE/BANDAGES/DRESSINGS) ×1 IMPLANT
TAPE PAPER 2X10 WHT MICROPORE (GAUZE/BANDAGES/DRESSINGS) ×1 IMPLANT
TOWEL GREEN STERILE (TOWEL DISPOSABLE) ×7 IMPLANT
TOWEL GREEN STERILE FF (TOWEL DISPOSABLE) ×4 IMPLANT
TRAY CATH LUMEN 1 20CM STRL (SET/KITS/TRAYS/PACK) ×4 IMPLANT
TRAY FOLEY SLVR 14FR TEMP STAT (SET/KITS/TRAYS/PACK) ×3 IMPLANT
TRAY FOLEY SLVR 16FR TEMP STAT (SET/KITS/TRAYS/PACK) ×7 IMPLANT
TUBE CONNECTING 12X1/4 (SUCTIONS) ×3 IMPLANT
TUBE CONNECTING 20X1/4 (TUBING) ×1 IMPLANT
TUBING ART PRESS 48 MALE/FEM (TUBING) ×2 IMPLANT
UNDERPAD 30X36 HEAVY ABSORB (UNDERPADS AND DIAPERS) ×7 IMPLANT
VENT LEFT HEART 12002 (CATHETERS)
WATER STERILE IRR 1000ML POUR (IV SOLUTION) ×14 IMPLANT
YANKAUER SUCT BULB TIP NO VENT (SUCTIONS) ×5 IMPLANT

## 2020-10-13 NOTE — Anesthesia Procedure Notes (Signed)
Procedure Name: Intubation Date/Time: 10/13/2020 8:07 AM Performed by: Darletta Moll, CRNA Pre-anesthesia Checklist: Patient identified, Emergency Drugs available, Suction available and Patient being monitored Patient Re-evaluated:Patient Re-evaluated prior to induction Oxygen Delivery Method: Circle system utilized Preoxygenation: Pre-oxygenation with 100% oxygen Induction Type: IV induction Ventilation: Mask ventilation without difficulty Laryngoscope Size: Glidescope and 4 Grade View: Grade I Tube type: Oral Tube size: 8.0 mm Number of attempts: 1 Airway Equipment and Method: Stylet Placement Confirmation: ETT inserted through vocal cords under direct vision,  positive ETCO2 and breath sounds checked- equal and bilateral Secured at: 23 cm Tube secured with: Tape Dental Injury: Teeth and Oropharynx as per pre-operative assessment  Comments: Performed by Severiano Gilbert

## 2020-10-13 NOTE — Progress Notes (Signed)
Pharmacy Antibiotic Note  Marcus Johnson. is a 60 y.o. male admitted on 09/28/2020 with ADHF.  Now s/p LVAD and Pharmacy has been consulted for vancomycin dosing for surgical prophylaxis x 48 hours from AET.  Patient received vancomycin 1500mg  IV around 0845.  Renal function is worsening - SCr up 1.77, CrCL 51 ml/min.  Hypothermic, WBC 29.4.  Plan: Change vanc to 1500mg  IV Q24H x 2 doses, start tomorrow Pharmacy will sign off.  Thank you for the consult!  Height: 5' 10.5" (179.1 cm) Weight: 90.8 kg (200 lb 2.8 oz) IBW/kg (Calculated) : 74.15  Temp (24hrs), Avg:97.4 F (36.3 C), Min:96.8 F (36 C), Max:99.6 F (37.6 C)  Recent Labs  Lab 10/10/20 0224 10/11/20 0506 10/12/20 0519 10/12/20 1752 10/13/20 0315 10/13/20 0840 10/13/20 1126 10/13/20 1156 10/13/20 1242 10/13/20 1402 10/13/20 1651  WBC 11.3* 14.0* 13.3*  --  14.1*  --   --   --   --   --  29.4*  CREATININE 2.18* 1.84* 1.73*   < > 1.96*   < > 1.60* 1.60* 1.60* 1.60* 1.77*   < > = values in this interval not displayed.    Estimated Creatinine Clearance: 51.4 mL/min (A) (by C-G formula based on SCr of 1.77 mg/dL (H)).    Allergies  Allergen Reactions  . Meclizine Hcl Anaphylaxis and Swelling  . Ivabradine Nausea Only    Rieley Hausman D. Mina Marble, PharmD, BCPS, [place] 10/13/2020, 6:15 PM

## 2020-10-13 NOTE — Anesthesia Procedure Notes (Signed)
Arterial Line Insertion Start/End3/05/2021 8:14 AM, 10/13/2020 8:18 AM Performed by: Oleta Mouse, MD, anesthesiologist  Patient location: Pre-op. Preanesthetic checklist: patient identified, IV checked, site marked, risks and benefits discussed, surgical consent, monitors and equipment checked, pre-op evaluation, timeout performed and anesthesia consent Patient sedated Left, radial was placed Catheter size: 20 G Hand hygiene performed  and maximum sterile barriers used   Attempts: 1 Procedure performed using ultrasound guided technique. Ultrasound Notes:anatomy identified, needle tip was noted to be adjacent to the nerve/plexus identified, no ultrasound evidence of intravascular and/or intraneural injection and image(s) printed for medical record Following insertion, dressing applied and Biopatch. Post procedure assessment: normal and unchanged  Patient tolerated the procedure well with no immediate complications.

## 2020-10-13 NOTE — Progress Notes (Signed)
PT Cancellation Note  Patient Details Name: Marcus Johnson. MRN: [id number] DOB: Jul 16, 1961   Cancelled Treatment:    Reason Eval/Treat Not Completed: Other (comment) (pt to have LVAD implanted. Will await post op orders)   Adalida Garver B Ommie Degeorge 10/13/2020, 7:31 AM  Bayard Males, PT Acute Rehabilitation Services Pager: [phone] Office: [phone]

## 2020-10-13 NOTE — Transfer of Care (Signed)
Immediate Anesthesia Transfer of Care Note  Patient: Marcus Johnson.  Procedure(s) Performed: REDO STERNOTOMY (N/A ) INSERTION OF IMPLANTABLE LEFT VENTRICULAR ASSIST DEVICE (N/A Chest) TRANSESOPHAGEAL ECHOCARDIOGRAM (TEE) (N/A ) REMOVAL OF IMPELLA LEFT VENTRICULAR ASSIST DEVICE  Patient Location: ICU  Anesthesia Type:General  Level of Consciousness: sedated and Patient remains intubated per anesthesia plan  Airway & Oxygen Therapy: Patient remains intubated per anesthesia plan and Patient placed on Ventilator (see vital sign flow sheet for setting)  Post-op Assessment: Report given to RN and Post -op Vital signs reviewed and stable  Post vital signs: Reviewed and stable  Last Vitals:  Vitals Value Taken Time  BP    Temp 36.2 C 10/13/20 1627  Pulse    Resp 21 10/13/20 1627  SpO2    Vitals shown include unvalidated device data.  Last Pain:  Vitals:   10/13/20 0400  TempSrc: Axillary  PainSc: Asleep      Patients Stated Pain Goal: 2 (41/96/22 2979)  Complications: No complications documented.   Patient transported to ICU with standard monitors (HR, BP, SPO2, RR) and emergency drugs/equipment. Controlled ventilation maintained via ventilator with Respiratory therapist Kim. Report given to bedside RN and respiratory therapist. Pt connected to ICU monitor and ventilator. All questions answered and vital signs stable before leaving. Dr. Cyndia Bent at bedside

## 2020-10-13 NOTE — Progress Notes (Signed)
Orthopedic Tech Progress Note Patient Details:  Alasdair Kleve 06/14/1961 [id number] Louretta Parma boot order canceled by provider Patient ID: [name]., male   DOB: [birth year], 60 y.o.   MRN: [id number]   Tammy Sours 10/13/2020, 6:43 PM

## 2020-10-13 NOTE — Progress Notes (Signed)
Nutrition Follow Up  DOCUMENTATION CODES:   Not applicable  INTERVENTION:    Ensure Enlive po BID, each supplement provides 350 kcal and 20 grams of protein  ProSource Plus 30 ml BID, each supplement provides 100 kcals and 15 grams protein.   MVI daily  NUTRITION DIAGNOSIS:   Increased nutrient needs related to chronic illness (CHF) as evidenced by estimated needs.  Ongoing  GOAL:   Patient will meet greater than or equal to 90% of their needs   Progressing   MONITOR:   PO intake,Supplement acceptance,Labs,Weight trends,Skin,I & O's  REASON FOR ASSESSMENT:   Consult LVAD Eval  ASSESSMENT:   Marcus Johnson. is a 60 y.o. male who has a h/o DM2, CAD s/p CABG 2919, systolic HF due to ischemic cardiomyopathy with EF 20-25% (echo 12/15), DM2 and CKD. He is s/p Medtronic single chamber ICD.Pt admitted with CHF.  3/1- s/p impella  3/4- s/p multiple tooth extraction   Patient undergoing LVAD implantation at this time.   No meal completions documented since 3/3. Nursing reports intake has been inconsistent. Will continue current supplementation post op. If intake unable to progress or remain consistent consider nutrition support given elevated nutrition requirement.   Admission weight: 97.5 kg  Current weight: 90.8 kg   UOP: 2925 ml x 24 hrs   Medications: dulcolax, colace, SS novolog, lantus Labs: Cr 1.60-stable CBG 170-262  Diet Order:   Diet Order            Diet NPO time specified  Diet effective midnight                 EDUCATION NEEDS:   Education needs have been addressed  Skin:  Skin Assessment: Skin Integrity Issues: Skin Integrity Issues:: Incisions Incisions: chest  Last BM:  3/9  Height:   Ht Readings from Last 1 Encounters:  10/03/20 5' 10.5" (1.791 m)    Weight:   Wt Readings from Last 1 Encounters:  10/13/20 90.8 kg    Ideal Body Weight:  76.8 kg  BMI:  Body mass index is 28.32 kg/m.  Estimated Nutritional  Needs:   Kcal:  2100-2300  Protein:  100-115 grams  Fluid:  2 L  Mariana Single RD, LDN Clinical Nutrition Pager listed in [place]

## 2020-10-13 NOTE — Anesthesia Preprocedure Evaluation (Addendum)
Anesthesia Evaluation  Patient identified by MRN, date of birth, ID band Patient awake    Reviewed: Allergy & Precautions, NPO status , Patient's Chart, lab work & pertinent test results  History of Anesthesia Complications Negative for: history of anesthetic complications  Airway Mallampati: III  TM Distance: >3 FB Neck ROM: Full    Dental  (+) Dental Advisory Given   Pulmonary shortness of breath, neg sleep apnea, neg COPD, neg recent URI,    breath sounds clear to auscultation       Cardiovascular hypertension, Pt. on medications and Pt. on home beta blockers + CAD, + Past MI and +CHF  + Cardiac Defibrillator  Rhythm:Irregular     Neuro/Psych  Neuromuscular disease negative psych ROS   GI/Hepatic negative GI ROS, Lab Results      Component                Value               Date                      ALT                      17                  10/12/2020                AST                      31                  10/12/2020                ALKPHOS                  136 (H)             10/12/2020                BILITOT                  1.3 (H)             10/12/2020              Endo/Other  diabetes, Insulin DependentLab Results      Component                Value               Date                      HGBA1C                   7.8 (H)             10/12/2020             Renal/GU CRF and ARFRenal diseaseLab Results      Component                Value               Date                      CREATININE               1.77 (H)  10/13/2020           Lab Results      Component                Value               Date                      K                        3.6                 10/13/2020                Musculoskeletal   Abdominal   Peds  Hematology  (+) Blood dyscrasia, anemia ,   Anesthesia Other Findings 1. Impella catheter in LV, measures around 4.8 cm. Left ventricular  ejection fraction, by  estimation, is 20 to 25%. The left ventricle has  severely decreased function. The left ventricle demonstrates global  hypokinesis. The left ventricular internal  cavity size was mildly dilated. There is mild left ventricular  hypertrophy.  2. Right ventricular systolic function is mildly reduced. The right  ventricular size is normal.  3. Limited echo.   FINDINGS  Left Ventricle: Impella catheter in LV, measures around 4.8 cm. Left  ventricular ejection fraction, by estimation, is 20 to 25%. The left  ventricle has severely decreased function. The left ventricle demonstrates  global hypokinesis. The left ventricular  internal cavity size was mildly dilated. There is mild left ventricular  hypertrophy.   Right Ventricle: The right ventricular size is normal. Right ventricular  systolic function is mildly reduced.   Reproductive/Obstetrics                                                             Anesthesia Evaluation  Patient identified by MRN, date of birth, ID band Patient awake    Reviewed: Allergy & Precautions, NPO status , Patient's Chart, lab work & pertinent test results  History of Anesthesia Complications Negative for: history of anesthetic complications  Airway Mallampati: II  TM Distance: >3 FB Neck ROM: Full    Dental  (+) Poor Dentition   Pulmonary neg pulmonary ROS,    Pulmonary exam normal        Cardiovascular hypertension, + CAD, + Past MI, + CABG (2002) and +CHF  Normal cardiovascular exam+ dysrhythmias Atrial Fibrillation + Cardiac Defibrillator   Ischemic cardiomyopathy s/p Impella placement 10/04/20   Neuro/Psych negative neurological ROS  negative psych ROS   GI/Hepatic negative GI ROS, Neg liver ROS,   Endo/Other  diabetes, Type 2  Renal/GU Renal InsufficiencyRenal disease  negative genitourinary   Musculoskeletal negative musculoskeletal ROS (+)   Abdominal   Peds  Hematology negative  hematology ROS (+)   Anesthesia Other Findings Day of surgery medications reviewed with patient.  Reproductive/Obstetrics negative OB ROS                            Anesthesia Physical Anesthesia Plan  ASA: IV  Anesthesia Plan: General   Post-op Pain Management:    Induction: Intravenous  PONV Risk Score and Plan: 2 and Treatment may  vary due to age or medical condition, Midazolam and Ondansetron  Airway Management Planned: Oral ETT and Video Laryngoscope Planned  Additional Equipment: Arterial line, CVP and PA Cath  Intra-op Plan:   Post-operative Plan: Extubation in OR  Informed Consent: I have reviewed the patients History and Physical, chart, labs and discussed the procedure including the risks, benefits and alternatives for the proposed anesthesia with the patient or authorized representative who has indicated his/her understanding and acceptance.     Dental advisory given  Plan Discussed with: CRNA  Anesthesia Plan Comments:        Anesthesia Quick Evaluation  Anesthesia Physical Anesthesia Plan  ASA: IV  Anesthesia Plan: General   Post-op Pain Management:    Induction: Intravenous  PONV Risk Score and Plan: 2 and Treatment may vary due to age or medical condition  Airway Management Planned: Oral ETT  Additional Equipment: Arterial line, CVP, PA Cath, TEE and Ultrasound Guidance Line Placement  Intra-op Plan:   Post-operative Plan: Post-operative intubation/ventilation  Informed Consent: I have reviewed the patients History and Physical, chart, labs and discussed the procedure including the risks, benefits and alternatives for the proposed anesthesia with the patient or authorized representative who has indicated his/her understanding and acceptance.     Dental advisory given  Plan Discussed with: CRNA and Surgeon  Anesthesia Plan Comments:         Anesthesia Quick Evaluation

## 2020-10-13 NOTE — OR Nursing (Signed)
Forty-five minute call to [place] at [phone]. Spoke to Chance.

## 2020-10-13 NOTE — Brief Op Note (Signed)
09/28/2020 - 10/13/2020  1:29 PM  PATIENT:  Marcus Johnson.  60 y.o. male  PRE-OPERATIVE DIAGNOSIS:  ISCHEMIC CARDIOMYOPATHY  POST-OPERATIVE DIAGNOSIS:  ISCHEMIC CARDIOMYOPATHY  PROCEDURE: TRANSESOPHAGEAL ECHOCARDIOGRAM (TEE), REDO STERNOTOMY, INSERTION OF IMPLANTABLE LEFT VENTRICULAR ASSIST DEVICE (HEARTMATE 3), and REMOVAL OF IMPELLA LEFT VENTRICULAR ASSIST DEVICE  SURGEON:  Surgeon(s) and Role:    Gaye Pollack, MD - Primary  PHYSICIAN ASSISTANT: Lars Pinks PA-C  ANESTHESIA:   general  EBL:  Per anesthesia and perfusion record.  DRAINS: Chest tubes placed in the mediastinal and pleural spaces   COUNTS CORRECT:  YES  DICTATION: .Dragon Dictation  PLAN OF CARE: Admit to inpatient   PATIENT DISPOSITION:  ICU - intubated and hemodynamically stable.   Delay start of Pharmacological VTE agent (>24hrs) due to surgical blood loss or risk of bleeding: yes  BASELINE WEIGHT: 90.8 kg

## 2020-10-13 NOTE — Progress Notes (Addendum)
Suction event occurred whilst turning patient off the bed pan. Resolved with lowering p level. Patient back on P8 and tolerating well. No site changes. Impella measures at 43.5cm.

## 2020-10-13 NOTE — Progress Notes (Signed)
Medtronic rep paged. ?

## 2020-10-13 NOTE — Progress Notes (Signed)
Patient ID: Marcus Sallade., male   DOB: 1961-01-20, 60 y.o.   MRN: [id number]      Advanced Heart Failure Rounding Note  PCP-Cardiologist: No primary care provider on file.   Subjective:    - 2/27 Milrinone switched to dobutamine 5 mcg.  - 2/28 Moved to ICU. Dobutamine increased to 7.5 mcg and Norepi 3 mcg added. Diuretics held.  - 3/1 Impella 5.5 placed.  - 3/4 Teeth removed - 3/10 HM3 LVAD placed  Patient now post-op LVAD placement, intubated.   Hgb 9.5 with creatinine 1.77 psot-op.  Co-ox 57%.   Intubated/sedated.  Initially post-op while still Marcus the OR, LV appeared small/decompressed at 5400 rpm and speed was turned down to 4800 rpm.  RV function was acceptable.  Currently on epinephrine 7, vasopressin 0.04, norepinephrine 18, milrinone 0.25.   Swan: CVP 10 PA 29/18 CI 1.6 Co-ox 57%  Echo: EF 20-25%, normal RV, PASP 61, dilated IVC.    Objective:   Weight Range: 90.8 kg Body mass index is 28.32 kg/m.   Vital Signs:   Temp:  [96.8 F (36 C)-99.6 F (37.6 C)] 97.16 F (36.2 C) (03/10 1800) Pulse Rate:  [29-103] 29 (03/10 1745) Resp:  [4-27] 12 (03/10 1800) BP: (97-140)/(55-128) 100/73 (03/10 0700) SpO2:  [92 %-100 %] 100 % (03/10 1745) Arterial Line BP: (75-87)/(52-64) 79/52 (03/10 1800) FiO2 (%):  [100 %] 100 % (03/10 1632) Weight:  [90.8 kg] 90.8 kg (03/10 0500) Last BM Date: 10/12/20  Weight change: Filed Weights   10/09/20 0600 10/10/20 0600 10/13/20 0500  Weight: 91.9 kg 91.4 kg 90.8 kg    Intake/Output:   Intake/Output Summary (Last 24 hours) at 10/13/2020 1821 Last data filed at 10/13/2020 1800 Gross per 24 hour  Intake 7401.55 ml  Output 4874 ml  Net 2527.55 ml      Physical Exam  CVP 10.  General: Intubated.   HEENT: Normal. Neck: Supple, JVP 10 cm. Carotids OK.  Cardiac:  Mechanical heart sounds with LVAD hum present.  Lungs:  CTAB, normal effort.  Abdomen:  NT, ND, no HSM. No bruits or masses. +BS  LVAD exit site:  Well-healed and incorporated. Dressing dry and intact. No erythema or drainage. Stabilization device present and accurately applied. Driveline dressing changed daily per sterile technique. Extremities:  Warm and dry. No cyanosis, clubbing, rash, or edema.  Neuro:  Sedated on vent.    Telemetry   Atrial fibrillation 90s (personally reviewed)  Labs    CBC Recent Labs    10/13/20 0315 10/13/20 0840 10/13/20 1223 10/13/20 1226 10/13/20 1402 10/13/20 1651  WBC 14.1*  --   --   --   --  29.4*  HGB 8.2*   < > 7.1*   < > 8.8* 9.5*  HCT 27.6*   < > 22.8*   < > 26.0* 30.1*  MCV 82.6  --   --   --   --  85.3  PLT 196  --  133*  --   --  130*   < > = values Marcus this interval not displayed.   Basic Metabolic Panel Recent Labs    10/12/20 1752 10/12/20 1826 10/13/20 0315 10/13/20 0840 10/13/20 1402 10/13/20 1651  NA 135   < > 134*   < > 139 136  K 3.8   < > 4.0   < > 3.3* 3.6  CL 101   < > 101   < > 101 105  CO2 27   < >  28  --   --  23  GLUCOSE 268*   < > 269*   < > 186* 144*  BUN 29*   < > 29*   < > 28* 25*  CREATININE 1.81*   < > 1.96*   < > 1.60* 1.77*  CALCIUM 8.6*   < > 8.5*  --   --  8.8*  MG 2.2  --   --   --   --  1.8   < > = values Marcus this interval not displayed.   Liver Function Tests Recent Labs    10/12/20 1826  AST 31  ALT 17  ALKPHOS 136*  BILITOT 1.3*  PROT 6.7  ALBUMIN 2.5*   No results for input(s): LIPASE, AMYLASE Marcus the last 72 hours. Cardiac Enzymes No results for input(s): CKTOTAL, CKMB, CKMBINDEX, TROPONINI Marcus the last 72 hours.  BNP: BNP (last 3 results) No results for input(s): BNP Marcus the last 8760 hours.  ProBNP (last 3 results) No results for input(s): PROBNP Marcus the last 8760 hours.   D-Dimer No results for input(s): DDIMER Marcus the last 72 hours. Hemoglobin A1C Recent Labs    10/12/20 1827  HGBA1C 7.8*   Fasting Lipid Panel No results for input(s): CHOL, HDL, LDLCALC, TRIG, CHOLHDL, LDLDIRECT Marcus the last 72 hours. Thyroid  Function Tests No results for input(s): TSH, T4TOTAL, T3FREE, THYROIDAB Marcus the last 72 hours.  Invalid input(s): FREET3  Other results:   Imaging    DG CHEST PORT 1 VIEW  Result Date: 10/13/2020 CLINICAL DATA:  Status post chest tube placement EXAM: PORTABLE CHEST 1 VIEW COMPARISON:  Film from earlier Marcus the same day. FINDINGS: Cardiac shadow is stable. Mediastinal drain, pericardial drain, gastric catheter, endotracheal tube and defibrillator are again noted. LVAD and Swan-Ganz catheter are again seen and stable. New right-sided chest tube is noted with resolution of previously seen pneumothorax and upper lobe collapse. No new focal abnormality is noted. IMPRESSION: Resolution of right-sided pneumothorax following chest tube placement. Electronically Signed   By: Inez Catalina M.D.   On: 10/13/2020 17:43   DG Chest Port 1 View  Result Date: 10/13/2020 CLINICAL DATA:  Status post LVAD placement EXAM: PORTABLE CHEST 1 VIEW COMPARISON:  10/10/2020 FINDINGS: Cardiac shadow is enlarged but stable. Impella catheter has been removed. Swan-Ganz catheter and defibrillator are again noted and stable. Postsurgical changes are again seen. Mediastinal and pericardial drain are noted as well. Endotracheal tube and gastric catheter are noted Marcus satisfactory position. Left ventricular assist device is noted also Marcus satisfactory position. Left lung is well aerated. Right lung demonstrates an area of consolidation centrally and what appears to be a large superolateral pneumothorax. No bony abnormality is noted. IMPRESSION: Tubes and lines as described above. New right-sided pneumothorax with partial upper lobe collapse. The surgical team is aware of this finding as a subsequent film following chest tube placement has been ordered. Electronically Signed   By: Inez Catalina M.D.   On: 10/13/2020 17:41     Medications:     Scheduled Medications: . sodium chloride   Intravenous Once  . [START ON 10/14/2020]  acetaminophen  1,000 mg Oral Q6H   Or  . [START ON 10/14/2020] acetaminophen (TYLENOL) oral liquid 160 mg/5 mL  1,000 mg Per Tube Q6H  . amiodarone  200 mg Oral BID  . [START ON 10/14/2020] aspirin EC  325 mg Oral Daily   Or  . [START ON 10/14/2020] aspirin  324 mg Per Tube Daily  Or  . [START ON 10/14/2020] aspirin  300 mg Rectal Daily  . [START ON 10/14/2020] bisacodyl  10 mg Oral Daily   Or  . [START ON 10/14/2020] bisacodyl  10 mg Rectal Daily  . chlorhexidine gluconate (MEDLINE KIT)  15 mL Mouth Rinse BID  . Chlorhexidine Gluconate Cloth  6 each Topical Daily  . [START ON 10/14/2020] docusate sodium  200 mg Oral Daily  . lidocaine  1 patch Transdermal Q24H  . mouth rinse  15 mL Mouth Rinse 10 times per day  . multivitamin with minerals  1 tablet Oral Daily  . [START ON 10/15/2020] pantoprazole  40 mg Oral Daily  . [START ON 10/14/2020] sodium chloride flush  3 mL Intravenous Q12H    Infusions: . sodium chloride 20 mL/hr at 10/13/20 1800  . [START ON 10/14/2020] sodium chloride    . sodium chloride 10 mL/hr at 10/13/20 1712  . albumin human 12.5 g (10/13/20 1659)  . cefUROXime (ZINACEF)  IV    . dexmedetomidine (PRECEDEX) IV infusion 0.7 mcg/kg/hr (10/13/20 1800)  . epinephrine 5 mcg/min (10/13/20 1800)  . famotidine (PEPCID) IV Stopped (10/13/20 1722)  . [START ON 10/14/2020] fluconazole (DIFLUCAN) IV    . insulin 1 mL/hr at 10/13/20 1800  . lactated ringers    . lactated ringers    . lactated ringers 20 mL/hr at 10/13/20 1800  . magnesium sulfate 50 mL/hr at 10/13/20 1800  . norepinephrine (LEVOPHED) Adult infusion 18 mcg/min (10/13/20 1800)  . potassium chloride 50 mL/hr at 10/13/20 1800  . [START ON 10/14/2020] rifampin (RIFADIN) IVPB    . [START ON 10/14/2020] vancomycin    . vasopressin 0.03 Units/min (10/13/20 1800)    PRN Medications: sodium chloride, albumin human, dextrose, lactated ringers, midazolam, morphine injection, ondansetron (ZOFRAN) IV, oxyCODONE, [START ON  10/14/2020] sodium chloride flush, traMADol    Assessment/Plan   1. COVID-19 infection:Marcus 1/22. Manifested primarily as nausea/vomiting. Very slow recovery, did not take meds for at least 3 wks but had recently started back on meds just prior to admission.   - Suspect that nausea/poor appetite was actually due to HF.   2.Acute on chronic systolic HF:ICM.HasMedtronic ICD. Narrow QRS, not CRT candidate. Has required milrinone multiple times Marcus the past for low output HF. Echo 6/19 with EF 20% and moderately decreased RV systolic function.CPX 4/19 with severe functional limitation due to [facility] 6/19 showed low output and coronary angiography showed no interventional targets.Echo Marcus 4/21 showed EF 25%, mildly decreased RV systolic function. CPX Marcus 5/21 showed severe HF limitation. He has been resistant to LVAD/transplant evaluation, both offered with discussions Marcus past. I worry that we will miss the window for advanced therapies. He was not interested Marcus barostimulatoractivation therapy. History of cardiorenal syndrome/diabetic nephropathy, creatinine up to 2.33 Marcus 1/22, likely due to dehydration with nausea/vomiting.He had been off meds for several weeks, just restarted 3-4 days PTA. Admitted with NYHA class IV symptoms.  Low output persisted despite milrinone and then dobutamine with rise Marcus creatinine to 2.5. Impella 5.5 placed 3/1.  Creatinine improved to 1.7 and HM3 LVAD was placed on 3/10. Patient currently on epinephrine 7, vasopressin 0.04, norepinephrine 18, milrinone 0.25. Co-ox 57% with CI 1.6. - I gradually increased LVAD speed back up to 5200 rpm, patient tolerated with flow up to 3.4-3.5 L/min.  - Continue current pressor/inotrope support, can wean pressors as able.  MAP now upper 70s.   - Continue NO.   - ASA 325 until INR up to 2.  -  Will start anticoagulation when surgery ok's.  2. CAD s/p CABG2002:Last cath Marcus 6/19 with patent grafts.  - Continue Crestor 40 mg  daily.  3. AKI on CKD Stage 3b: I suspect that this is a combination of cardiorenal syndrome and diabetic nephropathy and possibly ATN. Creatinine down from 2.5 to 1.7.  - continue hemodynamic support.  4. KT:GYBWLSL of very poor control but hgbA1ctrending down. Most recent 7.7%  - Cover with SSI. 5. Atrial flutter/fibrillation: s/p DC-CV 08/21/18.He has been Marcus rate-controlled atrial fibrillation.  - Continue amiodarone 200 mg bid.   - Will eventually resume anticoagulation.  6. Wound R Foot: Partial thickness skin loss noted. Cover with mepilex border and change every 5 days.  7. Right arm pain: Think neuropathic, per Dr. Cyndia Bent patient had stretch of brachial plexus with Impella placement. Improved.  - Should be better with Impella out.  8. ID: Afebrile, negative cultures.    CRITICAL CARE Performed by: Loralie Champagne  Total critical care time: 40 minutes  Critical care time was exclusive of separately billable procedures and treating other patients.  Critical care was necessary to treat or prevent imminent or life-threatening deterioration.  Critical care was time spent personally by me on the following activities: development of treatment plan with patient and/or surrogate as well as nursing, discussions with consultants, evaluation of patient's response to treatment, examination of patient, obtaining history from patient or surrogate, ordering and performing treatments and interventions, ordering and review of laboratory studies, ordering and review of radiographic studies, pulse oximetry and re-evaluation of patient's condition.   Length of Stay: [place], MD  10/13/2020, 6:21 PM  Advanced Heart Failure Team Pager [phone] (M-F; [place])  Please contact [facility] for night-coverage after hours (4p -7a ) and weekends on [email or web address]

## 2020-10-13 NOTE — Anesthesia Postprocedure Evaluation (Signed)
Anesthesia Post Note  Patient: Marcus Johnson.  Procedure(s) Performed: REDO STERNOTOMY (N/A ) INSERTION OF IMPLANTABLE LEFT VENTRICULAR ASSIST DEVICE (N/A Chest) TRANSESOPHAGEAL ECHOCARDIOGRAM (TEE) (N/A ) REMOVAL OF IMPELLA LEFT VENTRICULAR ASSIST DEVICE     Patient location during evaluation: SICU Anesthesia Type: General Level of consciousness: sedated Pain management: pain level controlled Vital Signs Assessment: post-procedure vital signs reviewed and stable Respiratory status: patient remains intubated per anesthesia plan Cardiovascular status: stable Postop Assessment: no apparent nausea or vomiting Anesthetic complications: no   No complications documented.  Last Vitals:  Vitals:   10/13/20 1745 10/13/20 1800  BP:    Pulse: (!) 29   Resp: 16 12  Temp: (!) 36.1 C (!) 36.2 C  SpO2: 100%     Last Pain:  Vitals:   10/13/20 0400  TempSrc: Axillary  PainSc: Asleep                 Afton Mikelson

## 2020-10-13 NOTE — Progress Notes (Signed)
CSW spoke with patient's son Marcus Johnson via phone. He reports multiple update calls from the VAD coordinator during the day and feeling positive about the surgery "going as planned". Son will be at bedside in the morning and grateful for the calls. Raquel Sarna, [place], [place]

## 2020-10-13 NOTE — Anesthesia Procedure Notes (Signed)
Central Venous Catheter Insertion Performed by: Oleta Mouse, MD, anesthesiologist Start/End3/05/2021 8:18 AM, 10/13/2020 8:28 AM Patient location: OR. Preanesthetic checklist: patient identified, IV checked, site marked, risks and benefits discussed, surgical consent, monitors and equipment checked, pre-op evaluation, timeout performed and anesthesia consent Position: supine Patient sedated Hand hygiene performed  and maximum sterile barriers used  Catheter size: 9 Fr Total catheter length 10. MAC introducer Procedure performed using ultrasound guided technique. Ultrasound Notes:anatomy identified, needle tip was noted to be adjacent to the nerve/plexus identified, no ultrasound evidence of intravascular and/or intraneural injection and image(s) printed for medical record Attempts: 1 Following insertion, line sutured, dressing applied and Biopatch. Post procedure assessment: blood return through all ports, free fluid flow and no air  Patient tolerated the procedure well with no immediate complications.

## 2020-10-14 ENCOUNTER — Inpatient Hospital Stay (HOSPITAL_COMMUNITY): Payer: Medicare HMO

## 2020-10-14 ENCOUNTER — Encounter (HOSPITAL_COMMUNITY): Payer: Self-pay | Admitting: Surgery

## 2020-10-14 DIAGNOSIS — Z95811 Presence of heart assist device: Secondary | ICD-10-CM | POA: Diagnosis not present

## 2020-10-14 DIAGNOSIS — I5023 Acute on chronic systolic (congestive) heart failure: Secondary | ICD-10-CM | POA: Diagnosis not present

## 2020-10-14 LAB — POCT I-STAT 7, (LYTES, BLD GAS, ICA,H+H)
Acid-base deficit: 2 mmol/L (ref 0.0–2.0)
Acid-base deficit: 2 mmol/L (ref 0.0–2.0)
Acid-base deficit: 4 mmol/L — ABNORMAL HIGH (ref 0.0–2.0)
Bicarbonate: 23 mmol/L (ref 20.0–28.0)
Bicarbonate: 22.7 mmol/L (ref 20.0–28.0)
Bicarbonate: 20.7 mmol/L (ref 20.0–28.0)
Calcium, Ion: 1.22 mmol/L (ref 1.15–1.40)
Calcium, Ion: 1.16 mmol/L (ref 1.15–1.40)
Calcium, Ion: 1.17 mmol/L (ref 1.15–1.40)
HCT: 24 % — ABNORMAL LOW (ref 39.0–52.0)
HCT: 27 % — ABNORMAL LOW (ref 39.0–52.0)
HCT: 27 % — ABNORMAL LOW (ref 39.0–52.0)
Hemoglobin: 8.2 g/dL — ABNORMAL LOW (ref 13.0–17.0)
Hemoglobin: 9.2 g/dL — ABNORMAL LOW (ref 13.0–17.0)
Hemoglobin: 9.2 g/dL — ABNORMAL LOW (ref 13.0–17.0)
O2 Saturation: 100 %
O2 Saturation: 98 %
O2 Saturation: 97 %
Patient temperature: 36.7
Patient temperature: 37.5
Patient temperature: 37.3
Potassium: 4.7 mmol/L (ref 3.5–5.1)
Potassium: 4.8 mmol/L (ref 3.5–5.1)
Potassium: 4.7 mmol/L (ref 3.5–5.1)
Sodium: 139 mmol/L (ref 135–145)
Sodium: 139 mmol/L (ref 135–145)
Sodium: 139 mmol/L (ref 135–145)
TCO2: 24 mmol/L (ref 22–32)
TCO2: 24 mmol/L (ref 22–32)
TCO2: 22 mmol/L (ref 22–32)
pCO2 arterial: 38 mmHg (ref 32.0–48.0)
pCO2 arterial: 39.1 mmHg (ref 32.0–48.0)
pCO2 arterial: 36.2 mmHg (ref 32.0–48.0)
pH, Arterial: 7.39 (ref 7.350–7.450)
pH, Arterial: 7.375 (ref 7.350–7.450)
pH, Arterial: 7.366 (ref 7.350–7.450)
pO2, Arterial: 194 mmHg — ABNORMAL HIGH (ref 83.0–108.0)
pO2, Arterial: 119 mmHg — ABNORMAL HIGH (ref 83.0–108.0)
pO2, Arterial: 95 mmHg (ref 83.0–108.0)

## 2020-10-14 LAB — COMPREHENSIVE METABOLIC PANEL
ALT: 29 U/L (ref 0–44)
AST: 138 U/L — ABNORMAL HIGH (ref 15–41)
Albumin: 3.1 g/dL — ABNORMAL LOW (ref 3.5–5.0)
Alkaline Phosphatase: 56 U/L (ref 38–126)
Anion gap: 8 (ref 5–15)
BUN: 26 mg/dL — ABNORMAL HIGH (ref 6–20)
CO2: 22 mmol/L (ref 22–32)
Calcium: 8.5 mg/dL — ABNORMAL LOW (ref 8.9–10.3)
Chloride: 106 mmol/L (ref 98–111)
Creatinine, Ser: 1.84 mg/dL — ABNORMAL HIGH (ref 0.61–1.24)
GFR, Estimated: 42 mL/min — ABNORMAL LOW (ref >60)
Glucose, Bld: 146 mg/dL — ABNORMAL HIGH (ref 70–99)
Potassium: 4.7 mmol/L (ref 3.5–5.1)
Sodium: 136 mmol/L (ref 135–145)
Total Bilirubin: 3.5 mg/dL — ABNORMAL HIGH (ref 0.3–1.2)
Total Protein: 5.5 g/dL — ABNORMAL LOW (ref 6.5–8.1)

## 2020-10-14 LAB — BASIC METABOLIC PANEL
Anion gap: 9 (ref 5–15)
BUN: 28 mg/dL — ABNORMAL HIGH (ref 6–20)
CO2: 23 mmol/L (ref 22–32)
Calcium: 8.2 mg/dL — ABNORMAL LOW (ref 8.9–10.3)
Chloride: 106 mmol/L (ref 98–111)
Creatinine, Ser: 1.9 mg/dL — ABNORMAL HIGH (ref 0.61–1.24)
GFR, Estimated: 40 mL/min — ABNORMAL LOW (ref >60)
Glucose, Bld: 112 mg/dL — ABNORMAL HIGH (ref 70–99)
Potassium: 4.7 mmol/L (ref 3.5–5.1)
Sodium: 138 mmol/L (ref 135–145)

## 2020-10-14 LAB — BPAM FFP
Blood Product Expiration Date: 202203132359
Blood Product Expiration Date: 202203132359
Blood Product Expiration Date: 202203132359
Blood Product Expiration Date: 202203132359
Blood Product Expiration Date: 202203132359
Blood Product Expiration Date: 202203152359
Blood Product Expiration Date: 202203132359
Blood Product Expiration Date: 202203152359
Blood Product Expiration Date: 202203152359
Blood Product Expiration Date: 202203152359
ISSUE DATE / TIME: 202203101110
ISSUE DATE / TIME: 202203101110
ISSUE DATE / TIME: 202203101110
ISSUE DATE / TIME: 202203101110
ISSUE DATE / TIME: 202203101443
ISSUE DATE / TIME: 202203101443
ISSUE DATE / TIME: 202203101851
ISSUE DATE / TIME: 202203102021
ISSUE DATE / TIME: 202203102248
ISSUE DATE / TIME: 202203102248
Unit Type and Rh: 6200
Unit Type and Rh: 6200
Unit Type and Rh: 6200
Unit Type and Rh: 6200
Unit Type and Rh: 6200
Unit Type and Rh: 6200
Unit Type and Rh: 6200
Unit Type and Rh: 6200
Unit Type and Rh: 6200
Unit Type and Rh: 6200

## 2020-10-14 LAB — BPAM PLATELET PHERESIS
Blood Product Expiration Date: 202203112359
ISSUE DATE / TIME: 202203101419
Unit Type and Rh: 600

## 2020-10-14 LAB — PREPARE FRESH FROZEN PLASMA
Unit division: 0
Unit division: 0
Unit division: 0

## 2020-10-14 LAB — GLUCOSE, CAPILLARY
Glucose-Capillary: 155 mg/dL — ABNORMAL HIGH (ref 70–99)
Glucose-Capillary: 137 mg/dL — ABNORMAL HIGH (ref 70–99)
Glucose-Capillary: 145 mg/dL — ABNORMAL HIGH (ref 70–99)
Glucose-Capillary: 122 mg/dL — ABNORMAL HIGH (ref 70–99)
Glucose-Capillary: 132 mg/dL — ABNORMAL HIGH (ref 70–99)
Glucose-Capillary: 125 mg/dL — ABNORMAL HIGH (ref 70–99)
Glucose-Capillary: 137 mg/dL — ABNORMAL HIGH (ref 70–99)
Glucose-Capillary: 115 mg/dL — ABNORMAL HIGH (ref 70–99)
Glucose-Capillary: 130 mg/dL — ABNORMAL HIGH (ref 70–99)
Glucose-Capillary: 119 mg/dL — ABNORMAL HIGH (ref 70–99)
Glucose-Capillary: 113 mg/dL — ABNORMAL HIGH (ref 70–99)
Glucose-Capillary: 114 mg/dL — ABNORMAL HIGH (ref 70–99)
Glucose-Capillary: 108 mg/dL — ABNORMAL HIGH (ref 70–99)
Glucose-Capillary: 118 mg/dL — ABNORMAL HIGH (ref 70–99)
Glucose-Capillary: 127 mg/dL — ABNORMAL HIGH (ref 70–99)
Glucose-Capillary: 121 mg/dL — ABNORMAL HIGH (ref 70–99)

## 2020-10-14 LAB — CBC WITH DIFFERENTIAL/PLATELET
Abs Immature Granulocytes: 0.71 10*3/uL — ABNORMAL HIGH (ref 0.00–0.07)
Basophils Absolute: 0.1 10*3/uL (ref 0.0–0.1)
Basophils Relative: 0 %
Eosinophils Absolute: 0 10*3/uL (ref 0.0–0.5)
Eosinophils Relative: 0 %
HCT: 25 % — ABNORMAL LOW (ref 39.0–52.0)
Hemoglobin: 8.2 g/dL — ABNORMAL LOW (ref 13.0–17.0)
Immature Granulocytes: 4 %
Lymphocytes Relative: 12 %
Lymphs Abs: 2.3 10*3/uL (ref 0.7–4.0)
MCH: 27.7 pg (ref 26.0–34.0)
MCHC: 32.8 g/dL (ref 30.0–36.0)
MCV: 84.5 fL (ref 80.0–100.0)
Monocytes Absolute: 2 10*3/uL — ABNORMAL HIGH (ref 0.1–1.0)
Monocytes Relative: 10 %
Neutro Abs: 14.7 10*3/uL — ABNORMAL HIGH (ref 1.7–7.7)
Neutrophils Relative %: 74 %
Platelets: 119 10*3/uL — ABNORMAL LOW (ref 150–400)
RBC: 2.96 MIL/uL — ABNORMAL LOW (ref 4.22–5.81)
RDW: 16.1 % — ABNORMAL HIGH (ref 11.5–15.5)
WBC: 19.7 10*3/uL — ABNORMAL HIGH (ref 4.0–10.5)
nRBC: 0.9 % — ABNORMAL HIGH (ref 0.0–0.2)

## 2020-10-14 LAB — COOXEMETRY PANEL
Carboxyhemoglobin: 1.2 % (ref 0.5–1.5)
Methemoglobin: 1.2 % (ref 0.0–1.5)
O2 Saturation: 53 %
Total hemoglobin: 11.1 g/dL — ABNORMAL LOW (ref 12.0–16.0)

## 2020-10-14 LAB — BRAIN NATRIURETIC PEPTIDE: B Natriuretic Peptide: 1097.8 pg/mL — ABNORMAL HIGH (ref 0.0–100.0)

## 2020-10-14 LAB — MAGNESIUM
Magnesium: 2.4 mg/dL (ref 1.7–2.4)
Magnesium: 2.5 mg/dL — ABNORMAL HIGH (ref 1.7–2.4)

## 2020-10-14 LAB — CBC
HCT: 26.7 % — ABNORMAL LOW (ref 39.0–52.0)
HCT: 26.9 % — ABNORMAL LOW (ref 39.0–52.0)
Hemoglobin: 9.2 g/dL — ABNORMAL LOW (ref 13.0–17.0)
Hemoglobin: 8.9 g/dL — ABNORMAL LOW (ref 13.0–17.0)
MCH: 28.5 pg (ref 26.0–34.0)
MCH: 28.1 pg (ref 26.0–34.0)
MCHC: 34.5 g/dL (ref 30.0–36.0)
MCHC: 33.1 g/dL (ref 30.0–36.0)
MCV: 82.7 fL (ref 80.0–100.0)
MCV: 84.9 fL (ref 80.0–100.0)
Platelets: 131 10*3/uL — ABNORMAL LOW (ref 150–400)
Platelets: 165 10*3/uL (ref 150–400)
RBC: 3.23 MIL/uL — ABNORMAL LOW (ref 4.22–5.81)
RBC: 3.17 MIL/uL — ABNORMAL LOW (ref 4.22–5.81)
RDW: 15.5 % (ref 11.5–15.5)
RDW: 16.1 % — ABNORMAL HIGH (ref 11.5–15.5)
WBC: 19.8 10*3/uL — ABNORMAL HIGH (ref 4.0–10.5)
WBC: 24.3 10*3/uL — ABNORMAL HIGH (ref 4.0–10.5)
nRBC: 1.3 % — ABNORMAL HIGH (ref 0.0–0.2)
nRBC: 1.3 % — ABNORMAL HIGH (ref 0.0–0.2)

## 2020-10-14 LAB — PREPARE PLATELET PHERESIS: Unit division: 0

## 2020-10-14 LAB — PROTIME-INR
INR: 1.4 — ABNORMAL HIGH (ref 0.8–1.2)
Prothrombin Time: 16.6 seconds — ABNORMAL HIGH (ref 11.4–15.2)

## 2020-10-14 LAB — PREPARE RBC (CROSSMATCH)

## 2020-10-14 LAB — LACTATE DEHYDROGENASE: LDH: 444 U/L — ABNORMAL HIGH (ref 98–192)

## 2020-10-14 LAB — PHOSPHORUS: Phosphorus: 3.6 mg/dL (ref 2.5–4.6)

## 2020-10-14 LAB — SURGICAL PATHOLOGY

## 2020-10-14 MED ORDER — SODIUM CHLORIDE 0.9% IV SOLUTION: Freq: Once | INTRAVENOUS | Status: AC

## 2020-10-14 MED ORDER — PANTOPRAZOLE SODIUM 40 MG PO PACK
40.0000 mg | PACK | Freq: Every day | ORAL | Status: DC
  Administered 2020-10-14: 40 mg
  Filled 2020-10-14: qty 20

## 2020-10-14 MED ORDER — TRAMADOL HCL 50 MG PO TABS: 50.0000 mg | ORAL_TABLET | ORAL | Status: DC | PRN

## 2020-10-14 MED ORDER — ADULT MULTIVITAMIN LIQUID CH
15.0000 mL | Freq: Every day | ORAL | Status: DC
  Administered 2020-10-14: 15 mL
  Filled 2020-10-14 (×2): qty 15

## 2020-10-14 MED ORDER — METOCLOPRAMIDE HCL 5 MG/ML IJ SOLN
10.0000 mg | Freq: Two times a day (BID) | INTRAMUSCULAR | Status: DC
  Administered 2020-10-14 – 2020-10-15 (×4): 10 mg via INTRAVENOUS
  Filled 2020-10-14 (×4): qty 2

## 2020-10-14 MED ORDER — OXYCODONE HCL 5 MG/5ML PO SOLN: 5.0000 mg | ORAL | Status: DC | PRN

## 2020-10-14 MED ORDER — ORAL CARE MOUTH RINSE
15.0000 mL | Freq: Two times a day (BID) | OROMUCOSAL | Status: DC
  Administered 2020-10-14 – 2020-10-30 (×30): 15 mL via OROMUCOSAL

## 2020-10-14 MED ORDER — AMIODARONE HCL 200 MG PO TABS
200.0000 mg | ORAL_TABLET | Freq: Two times a day (BID) | ORAL | Status: DC
  Administered 2020-10-14: 200 mg
  Filled 2020-10-14: qty 1

## 2020-10-14 MED ORDER — DOCUSATE SODIUM 50 MG/5ML PO LIQD
200.0000 mg | Freq: Every day | ORAL | Status: DC
  Administered 2020-10-14: 200 mg
  Filled 2020-10-14: qty 20

## 2020-10-14 MED ORDER — AMIODARONE HCL IN DEXTROSE 360-4.14 MG/200ML-% IV SOLN
30.0000 mg/h | INTRAVENOUS | Status: AC
  Administered 2020-10-14: 30 mg/h via INTRAVENOUS
  Filled 2020-10-14: qty 200

## 2020-10-14 MED ORDER — FUROSEMIDE 10 MG/ML IJ SOLN
20.0000 mg/h | INTRAVENOUS | Status: DC
  Administered 2020-10-14 – 2020-10-15 (×2): 8 mg/h via INTRAVENOUS
  Administered 2020-10-15: 12 mg/h via INTRAVENOUS
  Administered 2020-10-16 – 2020-10-18 (×5): 20 mg/h via INTRAVENOUS
  Filled 2020-10-14 (×10): qty 20

## 2020-10-14 MED ORDER — AMIODARONE HCL IN DEXTROSE 360-4.14 MG/200ML-% IV SOLN
30.0000 mg/h | INTRAVENOUS | Status: DC
  Administered 2020-10-15 – 2020-10-26 (×24): 30 mg/h via INTRAVENOUS
  Filled 2020-10-14 (×24): qty 200

## 2020-10-14 MED FILL — Magnesium Sulfate Inj 50%: INTRAMUSCULAR | Qty: 10 | Status: AC

## 2020-10-14 MED FILL — Potassium Chloride Inj 2 mEq/ML: INTRAVENOUS | Qty: 40 | Status: AC

## 2020-10-14 MED FILL — Heparin Sodium (Porcine) Inj 1000 Unit/ML: INTRAMUSCULAR | Qty: 30 | Status: AC

## 2020-10-14 NOTE — Progress Notes (Signed)
LVAD Coordinator Rounding Note:  Admitted 09/28/20 due to worsening heart failure symptoms. Recent COVID infection January 2022; did not take medications for 3-4 weeks.   S/p dental extractions 10/07/20 per Dr Benson Norway.  HM III LVAD implanted on 10/13/20 by Dr Cyndia Bent under Destination Therapy criteria.  Pt intubated and sedated this morning. Speed increased to 5200 last night. Several PI events noted after speed increase. Once volume status optimized, PI events stopped. (see blood product administration below) Speed increased to 5300 this morning per Dr Aundra Dubin.   Plan to continue Nitric Oxide wean this morning. Will possibly extubate this afternoon.   Urine output 35 cc/hr. Will start Lasix drip per Dr Aundra Dubin. CT output 90-110s cc/hr. Right chest tube placed yesterday after arrival from OR due to pneumothorax; minimal output noted. CVP 14. CI 1.8.   Vital signs: Temp: 99.5 HR: 87 Doppler Pressure: 78 Arterial BP: 92/58 (80) Automatic BP:  O2 Sat: 99% on FiO2 50% Wt: 200.1 lbs     LVAD interrogation reveals:  Speed: 5300 Flow: 3.5 Power: 3.7 w PI: 2.6 Hematocrit: 24  Alarms: none Events: 32 PI last night after speed increase. PI events noted this morning after speed increased to 5300  Fixed speed: 5300 Low speed limit: 5000  Drive Line: Existing VAD dressing removed and site care performed using sterile technique. Drive line exit site cleaned with Chlora prep applicators x 2, allowed to dry, and gauze dressing with silver strip re-applied. Exit site healing and unincorporated the velour is fully implanted at exit site. There is 1 suture visible that is wrapped around the driveline. Moderate amount of dried blood noted on gauze dressing. No redness, tenderness, foul odor or rash noted. Few inches of drive line from exit site to modular cable connection, making positioning/anchoring of drive line difficult. Drive line anchor correctly applied. Continue daily dressing changes by VAD  coordinator or nurse champion. Next dressing change is due 10/15/20.  Labs:  LDH trend: 444  INR trend: 1.4  Creatinine: 1.84  AST/ALT: 138/29  Anticoagulation Plan: -INR Goal: 2.0-2.5 -ASA Dose: 325 mg  Blood Products:  IntraOp:   1 Platelet  6 FFP  3 PRBC  1035 cell saver Postop 10/13/20:   4 FFP  4 PRBC  Device: - Medtronic single -Therapies: off   Arrythmias: VT in OR requiring defibrillation   Respiratory: Nitric oxide 10 ppm. Currently intubated.   Drips: Epinephrine 7 mcg/min Precedex 0.7 mcg/kg/hr Insulin Levophed 10 mcg/min Vasopressin 0.03 units/hr Milrinone 0.25 mcg/kg/min Lasix 8 mg/hr Amiodarone 30 mg/hr  Renal:  -BUN/CRT: 26/1.84  Plan/Recommendations:  1. Call VAD coordinator for equipment or drive line issues 2. Daily drive line dressing change per VAD coordinator or nurse champion  Emerson Monte RN [place] Coordinator  Office: [phone]  24/7 Pager: [phone]

## 2020-10-14 NOTE — Progress Notes (Signed)
   Afternoon round  Currently on norepi 10, epi 7, vaso 0.03 units, and lasix drip 8 mg/hour.   Swan #s  CVP 12 PA 23/12 (17) CO 3.8  CI 1.9   Urine output increased ~ 75 cc per hour. Continue current plan.   Amy Clegg NP-C  2:26 PM

## 2020-10-14 NOTE — Op Note (Signed)
CARDIOVASCULAR SURGERY OPERATIVE NOTE  Marcus Johnson [id number] 10/13/2020   Surgeon:  Gaye Pollack, MD  First Assistant: Lars Pinks, PA-C  Preoperative Diagnosis: Class IV heart failure with LVEF 20%   Postoperative Diagnosis:  Same   Procedure:  1. Insertion of right femoral arterial line 2. Redo Median Sternotomy 3. Extracorporeal circulation 4. Removal of right axillary Impella 5.5 5.   Implantation of HeartMate IIl Left Ventricular Assist Device.   Anesthesia:  General Endotracheal   Clinical History/Surgical Indication:  The patient is a 60 year old male with acute on chronic systolic congestive heart failure due to ischemic cardiomyopathy with ejection fraction of 20 to 25% who was admitted with repeat heart failure symptoms.  He has been on inotropic therapy with milrinone and dobutamine in the process of being worked up for a durable left ventricular assist device.  His creatinine was rising with a marginal Co-ox and he had a right axillary Impella 5.5 inserted for hemodynamic support to allow his renal function to improve. His creat improved from 2.5 to 1.7 gradually over the last week and he was felt to be stable for LVAD implant as destination therapy. I discussed the operative procedure with the patient and family including alternatives, benefits and risks; including but not limited to bleeding, blood transfusion, infection, stroke, organ dysfunction including need for dialysis that may be permanent, and death.  Marcus Johnson. understands and agrees to proceed.     Preparation:  The patient was taken directly to the operating room with the Impella in place and the correct patient, correct operation were confirmed with the patient after reviewing the medical record and catheterization. The consent was signed by me. Preoperative antibiotics were given. A pulmonary arterial line and radial arterial line were placed by the anesthesia team.  After being placed under general endotracheal anesthesia by the anesthesia team a foley catheter was placed. The neck, chest, abdomen, and both legs were prepped with betadine soap and solution and draped in the usual sterile manner. A surgical time-out was taken and the correct patient and operative procedure were confirmed with the nursing and anesthesia staff.   Pre-bypass and Post-bypass TEE:   Complete TEE assessment was performed by Dr. Ermalene Postin. There was severe LV systolic dysfunction. Mild MR, Moderate TR around the defibrillator lead. RV function appeared good with mild RV dilation. No AI, No PFO with bubble study from prior TEE.   Redo Median Sternotomy:   A redo median sternotomy was performed using the oscillating saw without difficulty. The sternal edges were retracted with bone hooks and the RV dissected from the sternum.  Right ventricular function appeared good. Dissection was performed to expose the right atrium and ascending aorta. The ascending aorta was of normal size and had no palpable plaque. There were no contraindications to aortic cannulation or cross-clamping.  4 cm transverse incision was made to the left of and superior to the umbilicus and continued down to the rectus fascia using electrocautery. A skin punch was used to create the drive line exit site about 3 cm below the left costal margin in the anterior axillary line. The tunneling device was passed in a subcutaneous plane from the transverse abdominal incision to a point midway to the pericardium and then through the rectus fascia into the pre-peritoneal space. The other tunneling device was passed from the abdominal incision the the skin exit site.  Cardiopulmonary Bypass:   The patient was fully systemically heparinized and the ACT was maintained >  400 sec. The proximal aortic arch was cannulated with a 20 F aortic cannula for arterial inflow. Venous cannulation was performed via the right atrial appendage using a  two-staged venous cannula. Carbon dioxide was insufflated into the pericardium at 6L/min throughout the procedure to minimize intracardiac air. The systemic temperature was allowed to drift downward slightly during the procedure and the patient was actively rewarmed. The remainder of the heart was dissected from the pericardial adhesion which were dense. A pump pocket was formed by dividing the pericardium out to the apex along the diaphragm.   Removal of Impella 5.5:    The patient was placed on cardiopulmonary bypass. Once on bypass the Impella was turned down to P2. The axillary incision was opened and the Impella pulled back above the aortic valve and then stopped. The Impella was removed and the axillary insertion graft was ligated with a short stump remaining using a heavy silk tie and then suture ligated with a 3-0 Prolene pledgetted horizontal mattress suture.   Insertion of apical outflow cannula:  Laparotomy pads were placed behind the heart to aid exposure of the apex. A site was chosen on the anterior wall lateral to the LAD and superior to the true apex. A stab incision was made and a foley catheter placed through the coring device and into the left ventricle. The balloon was inflated and with gentle traction on the catheter the coring device was carefully advanced to create the ventriculotomy. The balloon was deflated and removed. A sump was placed into the LV and the ventricular core excised. Trabeculations that might cause obstruction were excised. Then a series of 2-0 Ethibond horizontal mattress sutures were placed around the ventriculotomy. This took 13 sutures. The sutures were placed through the sewing ring on the outflow sleeve. The sutures were tied and the sleeve appeared to be well-sealed to the apex. Then volume was left in the heart and the lungs inflated. The HeartMate II was brought onto the operative field. The inflow cannula was inserted into the apical sleeve and the locking  ring engaged fully.  The drive line was then tunneled using the previously placed tunneling devices. The pump was placed into the pocket.   Aortic outflow graft anastomosis:  The outflow graft was cut to the appropriate length. The ascending aorta was partially occluded with a Lemole clamp. A vertical midline aortotomy was created and the ends enlarged with a 4.5 mm punch. The outflow graft was anastomosed to the aorta using 4-0 prolene pledgetted sutures at the toe and heal that were run down each side. Coseal was used to seal the needle holes in the graft. The Lemole clamp was removed and the graft flushed and clamped with a peripheral Debakey clamp. The graft was deaired.    Weaning from bypass:  The patient was started on Milrinone 0.25 mcg/kg/min, epinephrine 3 mcg/kg/min, Norepinephrine and vasopressin for BP support and nitric oxide at 20 ppm. The HeartMate IlI pump was started at 3000 rpm. TEE confirmed that the intracardiac air was removed.  Cardiopulmonary bypass flow was decreased to half flow and the clamp removed from the outflow graft. The speed was increased to 4000 rpm. Flow was decreased to 1/4 and the speed increased to 4500 Cardiopulmonary bypass was discontinued and the speed increased to 4800. TEE confirmed good RV function. Apical cannula position looked good. CPB time was 163 minutes.   Completion:  Two temporary epicardial pacing wires were placed on the right ventricle. Heparin was fully reversed with protamine  and the aortic and venous cannulas removed. Hemostasis was achieved. Mediastinal drainage tubes and left pleural chest tubes were placed with a drain in the pocket.  The sternum was closed with double #6 stainless steel wires. The fascia was closed with continuous # 1 vicryl suture with interrupted # 1 vicryl sutures in the lower incisional fascia. The subcutaneous tissue was closed with 2-0 vicryl continuous suture. The skin was closed with 3-0 vicryl subcuticular  suture. The abdominal incision was closed with continuous 3-0 vicryl subcutaneous suture and 3-0 vicryl subcuticular skin suture. The drive line exit site was re-approximated with 3-0 vicryl subcutaneous sutures and 3-0 nylon skin sutures. The drive line fastener was applied. The right axillary incision was hemostatic. There was a small space so a small JP drain was placed in the wound below the pectoralis muscles. The pectoralis was approximated with 2-0 Vicryl suture. The subcutaneous tissue was approximated with 3-0 Vicryl and the skin with 3-0 Vicryl subcuticular suture. All sponge, needle, and instrument counts were reported correct at the end of the case. Dry sterile dressings were placed over the incisions and around the chest tubes which were connected to pleurevac suction. The patient was then transported to the surgical intensive care unit in critical but stable condition.

## 2020-10-14 NOTE — Progress Notes (Signed)
Patient ID: Marcus Lindsay Poplin Jr., male   DOB: 03/03/1961, 59 y.o.   MRN: [id number] °HeartMate 3 Rounding Note ° °Subjective:   ° °Remains intubated and sedated. NO wean off today. ° °Milrinone 0.25, epi 7, NE 10, vaso 0.03 °CI 1.9 ° °Started on lasix drip 8 this am due to CVP 13. ° °Significant bloody drainage from mediastinal chest tubes overnight but minimal out now. ° °LVAD INTERROGATION:  °HeartMate IlI LVAD:  Flow 3.7 liters/min, speed 5300, power 4, PI 2.7.  ° °Objective:   ° °Vital Signs:   °Temp:  [95.7 °F (35.4 °C)-99.32 °F (37.4 °C)] 99.32 °F (37.4 °C) (03/11 1430) °Pulse Rate:  [25-98] 30 (03/11 1300) °Resp:  [0-27] 19 (03/11 1430) °BP: (82-94)/(55-61) 94/59 (03/11 0456) °SpO2:  [83 %-100 %] 100 % (03/11 1300) °Arterial Line BP: (70-93)/(47-64) 80/53 (03/11 1430) °FiO2 (%):  [50 %-100 %] 50 % (03/11 0800) °Weight:  [90.8 kg] 90.8 kg (03/11 0500) °Last BM Date: 10/12/20 °Mean arterial Pressure 70's ° °Intake/Output:  ° °Intake/Output Summary (Last 24 hours) at 10/14/2020 1439 °Last data filed at 10/14/2020 1400 °Gross per 24 hour  °Intake 12606.98 ml  °Output 6149 ml  °Net 6457.98 ml  °  ° °Physical Exam: °General:  Well appearing. No resp difficulty °HEENT: normal °Neck: supple. JVP . Carotids 2+ bilat; no bruits. No lymphadenopathy or thryomegaly appreciated. °Cor: Mechanical heart sounds with LVAD hum present. °Lungs: clear °Abdomen: soft, nontender, nondistended. No hepatosplenomegaly. No bruits or masses. Good bowel sounds. °Extremities: no cyanosis, clubbing, rash, edema °Neuro: alert & orientedx3, cranial nerves grossly intact. moves all 4 extremities w/o difficulty. Affect pleasant ° °Telemetry: sinus 80's ° °Labs: °Basic Metabolic Panel: °Recent Labs  °Lab 10/12/20 °1752 10/12/20 °1826 10/13/20 °0315 10/13/20 °0840 10/13/20 °1242 10/13/20 °1315 10/13/20 °1402 10/13/20 °1517 10/13/20 °1651 10/13/20 °1759 10/13/20 °2112 10/13/20 °2117 10/14/20 °0254 10/14/20 °0258  °NA 135 134* 134*   < > 138   < >  139   < > 136 140 136 140 136 139  °K 3.8 3.7 4.0   < > 3.7   < > 3.3*   < > 3.6 3.8 4.1 4.1 4.7 4.7  °CL 101 99 101   < > 100  --  101  --  105  --  107  --  106  --   °CO2 27 27 28  --   --   --   --   --  23  --  22  --  22  --   °GLUCOSE 268* 262* 269*   < > 229*  --  186*  --  144*  --  133*  --  146*  --   °BUN 29* 30* 29*   < > 28*  --  28*  --  25*  --  25*  --  26*  --   °CREATININE 1.81* 1.83* 1.96*   < > 1.60*  --  1.60*  --  1.77*  --  1.75*  --  1.84*  --   °CALCIUM 8.6* 8.6* 8.5*  --   --   --   --   --  8.8*  --  8.6*  --  8.5*  --   °MG 2.2  --   --   --   --   --   --   --  1.8  --  2.6*  --  2.4  --   °PHOS  --   --   --   --   --   --   --   --   --   --   --   --    3.6  --   ° < > = values in this interval not displayed.  ° ° °Liver Function Tests: °Recent Labs  °Lab 10/12/20 °1826 10/14/20 °0254  °AST 31 138*  °ALT 17 29  °ALKPHOS 136* 56  °BILITOT 1.3* 3.5*  °PROT 6.7 5.5*  °ALBUMIN 2.5* 3.1*  ° °No results for input(s): LIPASE, AMYLASE in the last 168 hours. °No results for input(s): AMMONIA in the last 168 hours. ° °CBC: °Recent Labs  °Lab 10/13/20 °0315 10/13/20 °0840 10/13/20 °1223 10/13/20 °1226 10/13/20 °1651 10/13/20 °1759 10/13/20 °2112 10/13/20 °2117 10/14/20 °0254 10/14/20 °0258 10/14/20 °0737  °WBC 14.1*  --   --   --  29.4*  --  22.0*  --  19.7*  --  19.8*  °NEUTROABS  --   --   --   --   --   --   --   --  14.7*  --   --   °HGB 8.2*   < > 7.1*   < > 9.5*   < > 7.3* 7.8* 8.2* 8.2* 9.2*  °HCT 27.6*   < > 22.8*   < > 30.1*   < > 22.3* 23.0* 25.0* 24.0* 26.7*  °MCV 82.6  --   --   --  85.3  --  82.6  --  84.5  --  82.7  °PLT 196  --  133*  --  130*  --  123*   119*  --  119*  --  131*  ° < > = values in this interval not displayed.  ° ° °INR: °Recent Labs  °Lab 10/12/20 °1826 10/13/20 °1651 10/13/20 °2112 10/14/20 °0254  °INR 1.4* 1.6* 1.5* 1.4*  ° ° °Other results: °· EKG:  ° °Imaging: °DG Chest Port 1 View ° °Result Date: 10/14/2020 °CLINICAL DATA:  Hypoxia EXAM: PORTABLE CHEST 1 VIEW  COMPARISON:  October 13, 2020. FINDINGS: Endotracheal tube tip is 3.7 cm above the carina. Nasogastric tube tip and side port are below the diaphragm. Swan-Ganz catheter tip is in the right main pulmonary artery. There is a chest tube on each side as well as a mediastinal drain. Pacemaker leads attached to right atrium and right ventricle. There is a left ventricular assist device present. No pneumothorax. There is atelectatic change in the left lower lobe. Lungs elsewhere clear. Heart is enlarged, stable. Pulmonary vascularity is within normal limits. No evident adenopathy. No bone lesions. IMPRESSION: Tube and catheter positions as described without pneumothorax. Left ventricular assist device present. Pacemaker leads attached to right atrium and right ventricle. Left lower lobe atelectasis. Lungs otherwise clear. Stable cardiac silhouette. Electronically Signed   By: William  Woodruff III M.D.   On: 10/14/2020 08:22  ° °DG CHEST PORT 1 VIEW ° °Result Date: 10/13/2020 °CLINICAL DATA:  Status post chest tube placement EXAM: PORTABLE CHEST 1 VIEW COMPARISON:  Film from earlier in the same day. FINDINGS: Cardiac shadow is stable. Mediastinal drain, pericardial drain, gastric catheter, endotracheal tube and defibrillator are again noted. LVAD and Swan-Ganz catheter are again seen and stable. New right-sided chest tube is noted with resolution of previously seen pneumothorax and upper lobe collapse. No new focal abnormality is noted. IMPRESSION: Resolution of right-sided pneumothorax following chest tube placement. Electronically Signed   By: Mark  Lukens M.D.   On: 10/13/2020 17:43  ° °DG Chest Port 1 View ° °Result Date: 10/13/2020 °CLINICAL DATA:  Status post LVAD placement EXAM: PORTABLE CHEST 1 VIEW COMPARISON:  10/10/2020 FINDINGS: Cardiac shadow is enlarged but   stable. Impella catheter has been removed. Swan-Ganz catheter and defibrillator are again noted and stable. Postsurgical changes are again seen. Mediastinal  and pericardial drain are noted as well. Endotracheal tube and gastric catheter are noted in satisfactory position. Left ventricular assist device is noted also in satisfactory position. Left lung is well aerated. Right lung demonstrates an area of consolidation centrally and what appears to be a large superolateral pneumothorax. No bony abnormality is noted. IMPRESSION: Tubes and lines as described above. New right-sided pneumothorax with partial upper lobe collapse. The surgical team is aware of this finding as a subsequent film following chest tube placement has been ordered. Electronically Signed   By: Inez Catalina M.D.   On: 10/13/2020 17:41      Medications:     Scheduled Medications:  acetaminophen  1,000 mg Oral Q6H   Or   acetaminophen (TYLENOL) oral liquid 160 mg/5 mL  1,000 mg Per Tube Q6H   aspirin EC  325 mg Oral Daily   Or   aspirin  324 mg Per Tube Daily   Or   aspirin  300 mg Rectal Daily   bisacodyl  10 mg Oral Daily   Or   bisacodyl  10 mg Rectal Daily   chlorhexidine gluconate (MEDLINE KIT)  15 mL Mouth Rinse BID   Chlorhexidine Gluconate Cloth  6 each Topical Daily   docusate  200 mg Per Tube Daily   lidocaine  1 patch Transdermal Q24H   mouth rinse  15 mL Mouth Rinse 10 times per day   metoCLOPramide (REGLAN) injection  10 mg Intravenous Q12H   multivitamin  15 mL Per Tube Daily   pantoprazole sodium  40 mg Per Tube Daily   sodium chloride flush  3 mL Intravenous Q12H     Infusions:  sodium chloride 20 mL/hr at 10/14/20 1400   sodium chloride     sodium chloride 10 mL/hr at 10/14/20 1040   amiodarone     amiodarone     cefUROXime (ZINACEF)  IV Stopped (10/14/20 0929)   dexmedetomidine (PRECEDEX) IV infusion 0.7 mcg/kg/hr (10/14/20 1400)   epinephrine 7 mcg/min (10/14/20 1400)   furosemide (LASIX) 200 mg in dextrose 5% 100 mL (18m/mL) infusion 8 mg/hr (10/14/20 1400)   insulin 4 mL/hr at 10/14/20 1400   lactated ringers      lactated ringers     lactated ringers 20 mL/hr at 10/14/20 1400   milrinone 0.25 mcg/kg/min (10/14/20 1400)   norepinephrine (LEVOPHED) Adult infusion 10 mcg/min (10/14/20 1400)   vancomycin Stopped (10/14/20 1146)   vasopressin 0.03 Units/min (10/14/20 1400)     PRN Medications:  sodium chloride, dextrose, lactated ringers, midazolam, morphine injection, ondansetron (ZOFRAN) IV, oxyCODONE, sodium chloride flush, traMADol   Assessment:   1. Acute on chronic systolic congestive heart failure due to ischemic cardiomyopathy with ejection fraction of 20 to 25% admitted with class IIIb symptoms. POD 1 HM3 LVAD.  2. CAD s/p CABG 2002 with patent grafts on last cath.  3.   Stage 3 CKD due to diabetic nephropathy and cardiorenal syndrome. Baseline recently 1.8.  4.  Atrial fib/flutter: maintaining sinus on amio po preop.  Plan/Discussion:    He has been hemodynamically stable overall. Vent has been weaned and hopefully going to extubate soon.  Urine output has been 75/hr with lasix drip. Will continue for now.  Having a lot of OG drainage so will keep NPO and start IV amio.  Keep chest tubes in.  No anticoagulation yet with postop  coagulopathy and bleeding.  ° ° °Length of Stay: 16 ° °Bryan K Bartle °10/14/2020, 2:39 PM °

## 2020-10-14 NOTE — Progress Notes (Signed)
OT Cancellation Note  Patient Details Name: Marcus Johnson. MRN: [id number] DOB: 1961/03/20   Cancelled Treatment:    Reason Eval/Treat Not Completed: Other (comment). Order is for OT to start on POD #2 (10/15/2020). Will eval as appropriate on that day or later.  [place], OTR/L Acute Rehab Services Pager [phone] Office [phone]     Almon Register 10/14/2020, 10:28 AM

## 2020-10-14 NOTE — Progress Notes (Signed)
1700 psi on the INO Therapy Tank at this time

## 2020-10-14 NOTE — Plan of Care (Signed)
  Problem: Education: Goal: Understanding of CV disease, CV risk reduction, and recovery process will improve Outcome: Progressing   Problem: Activity: Goal: Ability to return to baseline activity level will improve Outcome: Progressing   Problem: Cardiovascular: Goal: Ability to achieve and maintain adequate cardiovascular perfusion will improve Outcome: Progressing Goal: Vascular access site(s) Level 0-1 will be maintained Outcome: Progressing   

## 2020-10-14 NOTE — Progress Notes (Signed)
RR decreased to 4bpm, Fi02 to 0.40.

## 2020-10-14 NOTE — Progress Notes (Signed)
Pt extubated to 4lpm [place].  NIF > -20, VC = 60mL/kg. Positive for cuff leak. Pt is tolerating well.

## 2020-10-14 NOTE — Progress Notes (Signed)
Patient still intubated and no family at bedside at the time of CSW visit. CSW continues to follow for supportive intervention throughout hospitalization. Raquel Sarna, [place], [place]

## 2020-10-14 NOTE — Progress Notes (Signed)
Patient ID: Marcus Johnson., male   DOB: 06/09/1961, 60 y.o.   MRN: [id number]  TCTS Evening Rounds:   Hemodynamically stable  CI = 2.1 Milrinone 0.25, NE 16, epi 7, vaso 0.03 VAD parameters stable.  Extubated this afternoon.   Urine output good on lasix drip  CT output low  CBC    Component Value Date/Time   WBC 24.3 (H) 10/14/2020 1655   RBC 3.17 (L) 10/14/2020 1655   HGB 8.9 (L) 10/14/2020 1655   HGB 13.1 06/23/2020 1027   HCT 26.9 (L) 10/14/2020 1655   HCT 41.0 06/23/2020 1027   PLT 165 10/14/2020 1655   PLT 281 06/23/2020 1027   MCV 84.9 10/14/2020 1655   MCV 79 06/23/2020 1027   MCH 28.1 10/14/2020 1655   MCHC 33.1 10/14/2020 1655   RDW 16.1 (H) 10/14/2020 1655   RDW 15.0 06/23/2020 1027   LYMPHSABS 2.3 10/14/2020 0254   LYMPHSABS 2.5 06/23/2020 1027   MONOABS 2.0 (H) 10/14/2020 0254   EOSABS 0.0 10/14/2020 0254   EOSABS 0.1 06/23/2020 1027   BASOSABS 0.1 10/14/2020 0254   BASOSABS 0.0 06/23/2020 1027     BMET    Component Value Date/Time   NA 139 10/14/2020 1549   NA 140 06/23/2020 1027   K 4.8 10/14/2020 1549   CL 106 10/14/2020 0254   CO2 22 10/14/2020 0254   GLUCOSE 146 (H) 10/14/2020 0254   BUN 26 (H) 10/14/2020 0254   BUN 46 (H) 06/23/2020 1027   CREATININE 1.84 (H) 10/14/2020 0254   CREATININE 1.25 12/19/2015 0749   CALCIUM 8.5 (L) 10/14/2020 0254   GFRNONAA 42 (L) 10/14/2020 0254   GFRAA 40 (L) 06/23/2020 1027     A/P:  Stable postop course. Continue current plans

## 2020-10-14 NOTE — Progress Notes (Addendum)
Patient ID: Marcus Johnson., male   DOB: 1961/05/27, 60 y.o.   MRN: [id number]      Advanced Heart Failure Rounding Note  PCP-Cardiologist: No primary care provider on file.   Subjective:    - 2/27 Milrinone switched to dobutamine 5 mcg.  - 2/28 Moved to ICU. Dobutamine increased to 7.5 mcg and Norepi 3 mcg added. Diuretics held.  - 3/1 Impella 5.5 placed.  - 3/4 Teeth removed - 3/10 HM3 LVAD placed.Speed increased from 4800-->5200.  Received 3uPRBC + 4 FFP  Remains on Norepi 10, milrinone 0.25 , epi 7, vasopressin 0.03. On iNO @ 10 ppm  Intubated. Follows commands.   Swan: CVP 13-14  PA 27/17 (20)  CI 1.6  Co-ox 53%  LVAD Interrogation HM III: Speed: 5200 Flow: 3.5 PI: 2.3 Power: 3.5    Rare PI events   Objective:   Weight Range: 90.8 kg Body mass index is 28.32 kg/m.   Vital Signs:   Temp:  [95.7 F (35.4 C)-99.14 F (37.3 C)] 99.14 F (37.3 C) (03/11 0700) Pulse Rate:  [25-98] 28 (03/11 0515) Resp:  [0-27] 11 (03/11 0700) BP: (82-94)/(55-61) 94/59 (03/11 0456) SpO2:  [83 %-100 %] 97 % (03/11 0523) Arterial Line BP: (70-93)/(47-64) 84/62 (03/11 0700) FiO2 (%):  [50 %-100 %] 50 % (03/11 0419) Weight:  [90.8 kg] 90.8 kg (03/11 0500) Last BM Date: 10/12/20  Weight change: Filed Weights   10/10/20 0600 10/13/20 0500 10/14/20 0500  Weight: 91.4 kg 90.8 kg 90.8 kg    Intake/Output:   Intake/Output Summary (Last 24 hours) at 10/14/2020 0706 Last data filed at 10/14/2020 0700 Gross per 24 hour  Intake 14258.8 ml  Output 6264 ml  Net 7994.8 ml      Physical Exam  CVP 13 Physical Exam: GENERAL: Intubated HEENT: ETT NECK: Supple, JVP difficult to assess .  2+ bilaterally, no bruits.  No lymphadenopathy or thyromegaly appreciated.   RIJ swan CARDIAC:  Mechanical heart sounds with LVAD hum present. MT x4 LUNGS:  Clear to auscultation bilaterally.  ABDOMEN:  Soft, round, nontender, positive bowel sounds x4.     LVAD exit site:  Driveline dressing  with drainage marked.  EXTREMITIES:  Warm and dry, no cyanosis, clubbing, rash.. Extremities edematous.   NEUROLOGIC:  Intubated. Opens eyes follows commands. MAE   Telemetry   SR 80s with episodes NSVT  Labs    CBC Recent Labs    10/13/20 2112 10/13/20 2117 10/14/20 0254 10/14/20 0258  WBC 22.0*  --  19.7*  --   NEUTROABS  --   --  14.7*  --   HGB 7.3*   < > 8.2* 8.2*  HCT 22.3*   < > 25.0* 24.0*  MCV 82.6  --  84.5  --   PLT 123*  119*  --  119*  --    < > = values in this interval not displayed.   Basic Metabolic Panel Recent Labs    10/13/20 2112 10/13/20 2117 10/14/20 0254 10/14/20 0258  NA 136   < > 136 139  K 4.1   < > 4.7 4.7  CL 107  --  106  --   CO2 22  --  22  --   GLUCOSE 133*  --  146*  --   BUN 25*  --  26*  --   CREATININE 1.75*  --  1.84*  --   CALCIUM 8.6*  --  8.5*  --   MG 2.6*  --  2.4  --   PHOS  --   --  3.6  --    < > = values in this interval not displayed.   Liver Function Tests Recent Labs    10/12/20 1826 10/14/20 0254  AST 31 138*  ALT 17 29  ALKPHOS 136* 56  BILITOT 1.3* 3.5*  PROT 6.7 5.5*  ALBUMIN 2.5* 3.1*   No results for input(s): LIPASE, AMYLASE in the last 72 hours. Cardiac Enzymes No results for input(s): CKTOTAL, CKMB, CKMBINDEX, TROPONINI in the last 72 hours.  BNP: BNP (last 3 results) Recent Labs    10/14/20 0254  BNP 1,097.8*    ProBNP (last 3 results) No results for input(s): PROBNP in the last 8760 hours.   D-Dimer Recent Labs    10/13/20 2112  DDIMER 0.88*   Hemoglobin A1C Recent Labs    10/12/20 1827  HGBA1C 7.8*   Fasting Lipid Panel No results for input(s): CHOL, HDL, LDLCALC, TRIG, CHOLHDL, LDLDIRECT in the last 72 hours. Thyroid Function Tests No results for input(s): TSH, T4TOTAL, T3FREE, THYROIDAB in the last 72 hours.  Invalid input(s): FREET3  Other results:   Imaging    DG CHEST PORT 1 VIEW  Result Date: 10/13/2020 CLINICAL DATA:  Status post chest tube  placement EXAM: PORTABLE CHEST 1 VIEW COMPARISON:  Film from earlier in the same day. FINDINGS: Cardiac shadow is stable. Mediastinal drain, pericardial drain, gastric catheter, endotracheal tube and defibrillator are again noted. LVAD and Swan-Ganz catheter are again seen and stable. New right-sided chest tube is noted with resolution of previously seen pneumothorax and upper lobe collapse. No new focal abnormality is noted. IMPRESSION: Resolution of right-sided pneumothorax following chest tube placement. Electronically Signed   By: Inez Catalina M.D.   On: 10/13/2020 17:43   DG Chest Port 1 View  Result Date: 10/13/2020 CLINICAL DATA:  Status post LVAD placement EXAM: PORTABLE CHEST 1 VIEW COMPARISON:  10/10/2020 FINDINGS: Cardiac shadow is enlarged but stable. Impella catheter has been removed. Swan-Ganz catheter and defibrillator are again noted and stable. Postsurgical changes are again seen. Mediastinal and pericardial drain are noted as well. Endotracheal tube and gastric catheter are noted in satisfactory position. Left ventricular assist device is noted also in satisfactory position. Left lung is well aerated. Right lung demonstrates an area of consolidation centrally and what appears to be a large superolateral pneumothorax. No bony abnormality is noted. IMPRESSION: Tubes and lines as described above. New right-sided pneumothorax with partial upper lobe collapse. The surgical team is aware of this finding as a subsequent film following chest tube placement has been ordered. Electronically Signed   By: Inez Catalina M.D.   On: 10/13/2020 17:41     Medications:     Scheduled Medications: . acetaminophen  1,000 mg Oral Q6H   Or  . acetaminophen (TYLENOL) oral liquid 160 mg/5 mL  1,000 mg Per Tube Q6H  . amiodarone  200 mg Oral BID  . aspirin EC  325 mg Oral Daily   Or  . aspirin  324 mg Per Tube Daily   Or  . aspirin  300 mg Rectal Daily  . bisacodyl  10 mg Oral Daily   Or  . bisacodyl   10 mg Rectal Daily  . chlorhexidine gluconate (MEDLINE KIT)  15 mL Mouth Rinse BID  . Chlorhexidine Gluconate Cloth  6 each Topical Daily  . docusate sodium  200 mg Oral Daily  . lidocaine  1 patch Transdermal Q24H  . mouth rinse  15 mL Mouth Rinse 10 times per day  . multivitamin with minerals  1 tablet Oral Daily  . [START ON 10/15/2020] pantoprazole  40 mg Oral Daily  . sodium chloride flush  3 mL Intravenous Q12H    Infusions: . sodium chloride Stopped (10/14/20 0538)  . sodium chloride    . sodium chloride 10 mL/hr at 10/13/20 1712  . albumin human Stopped (10/13/20 1915)  . cefUROXime (ZINACEF)  IV Stopped (10/13/20 2022)  . dexmedetomidine (PRECEDEX) IV infusion 0.7 mcg/kg/hr (10/14/20 0700)  . epinephrine 7 mcg/min (10/14/20 0700)  . fluconazole (DIFLUCAN) IV 100 mL/hr at 10/14/20 0700  . insulin 3.4 mL/hr at 10/14/20 0700  . lactated ringers    . lactated ringers    . lactated ringers 20 mL/hr at 10/14/20 0700  . milrinone 0.25 mcg/kg/min (10/14/20 0700)  . norepinephrine (LEVOPHED) Adult infusion 10 mcg/min (10/14/20 0700)  . rifampin (RIFADIN) IVPB    . vancomycin    . vasopressin 0.03 Units/min (10/14/20 0700)    PRN Medications: sodium chloride, albumin human, dextrose, lactated ringers, midazolam, morphine injection, ondansetron (ZOFRAN) IV, oxyCODONE, sodium chloride flush, traMADol    Assessment/Plan   1. COVID-19 infection:in 1/22. Manifested primarily as nausea/vomiting. Very slow recovery, did not take meds for at least 3 wks but had recently started back on meds just prior to admission.   - Suspect that nausea/poor appetite was actually due to HF.   2.Acute on chronic systolic HF:ICM.HasMedtronic ICD. Narrow QRS, not CRT candidate. Has required milrinone multiple times in the past for low output HF. Echo 6/19 with EF 20% and moderately decreased RV systolic function.CPX 4/19 with severe functional limitation due to [facility] 6/19 showed low output  and coronary angiography showed no interventional targets.Echo in 4/21 showed EF 25%, mildly decreased RV systolic function. CPX in 5/21 showed severe HF limitation. He has been resistant to LVAD/transplant evaluation, both offered with discussions in past. I worry that we will miss the window for advanced therapies. He was not interested in barostimulatoractivation therapy. History of cardiorenal syndrome/diabetic nephropathy, creatinine up to 2.33 in 1/22, likely due to dehydration with nausea/vomiting.He had been off meds for several weeks, just restarted 3-4 days PTA. Admitted with NYHA class IV symptoms.  Low output persisted despite milrinone and then dobutamine with rise in creatinine to 2.5. Impella 5.5 placed 3/1.  Creatinine improved to 1.7 and HM3 LVAD was placed on 3/10. Patient currently on epinephrine 7, vasopressin 0.03, norepinephrine 10, milrinone 0.25. Co-ox 53%.  CI 1.6. - LVAD speed remains at  5200 rpm, 3.5 L/min.  - Will need slowly wean pressors to preserve renal perfusion.  - Will slowly start to diurese. Start lasix drip at 8 mg per hour.    - Weaning iNO.    - ASA 325 until INR up to 2.  - Will start anticoagulation when surgery ok's.  2. CAD s/p CABG2002:Last cath in 6/19 with patent grafts.  - Continue Crestor 40 mg daily.  3. AKI on CKD Stage 3b: Suspect that this is a combination of cardiorenal syndrome and diabetic nephropathy and possibly ATN. Creatinine down from 2.5 to 1.7 prior to surgery.  - Today creatinine 1.84. Follow closely.   - continue hemodynamic support.  4. XB:JYNWGNF of very poor control but hgbA1ctrending down. Most recent 7.7%  - Cover with SSI. 5. Atrial flutter/fibrillation: s/p DC-CV 08/21/18.He has been in rate-controlled atrial fibrillation.  - Continue amiodarone 200 mg bid.   - Will eventually resume anticoagulation.  6. Wound  R Foot: Partial thickness skin loss noted. Cover with mepilex border and change every 5 days.  7.  Right arm pain: Think neuropathic, per Dr. Cyndia Bent patient had stretch of brachial plexus with Impella placement. Improved.  - Should be better with Impella out.  8. ID: Afebrile, negative cultures.  9. Anemia, expected blood loss Hgb 8.2 after receiving blood.  10. PTX: On right, post-op.  - Has right chest tube for PTX.   Discussed with Dr Cyndia Bent will start to slowly diurese.   Length of Stay: [place], NP  10/14/2020, 7:06 AM  Advanced Heart Failure Team Pager [phone] (M-F; [place])  Please contact [facility] for night-coverage after hours (4p -7a ) and weekends on [email or web address]  Patient seen with NP, agree with the above note.   MAP upper 70s on epinephrine 7, NE 10, vasopressin 0.03, milrinone 0.25.  Cardiac output remains a bit low with co-ox 53% and CI 1.8.  No diuretic yet, making about 30 cc/hr urine with creatinine 1.8.   Wakes up on vent.  Tm 99.1.   General: Intubated.  Neck: JVP 12 cm, no thyromegaly or thyroid nodule.  Lungs: Decreased at bases.  CV: Nondisplaced PMI.  Heart regular S1/S2, no S3/S4, no murmur.  No peripheral edema.   Abdomen: Soft, nontender, no hepatosplenomegaly, no distention.  Skin: Intact without lesions or rashes.  Neurologic: Sedated on vent but will awaken.  Extremities: No clubbing or cyanosis.  HEENT: Normal.   Stable LVAD parameters this morning.  I turned speed up to 5300 rpm.  Will repeat output numbers later this morning.  Wean pressor support slowly.    Start Lasix 8 mg/hr.  Follow creatinine closely.  Aim to extubate later today hopefully.    Continue ASA 325, start warfarin when ok with surgery.   CRITICAL CARE Performed by: Loralie Champagne  Total critical care time: 40 minutes  Critical care time was exclusive of separately billable procedures and treating other patients.  Critical care was necessary to treat or prevent imminent or life-threatening deterioration.  Critical care was time spent personally by me on the  following activities: development of treatment plan with patient and/or surrogate as well as nursing, discussions with consultants, evaluation of patient's response to treatment, examination of patient, obtaining history from patient or surrogate, ordering and performing treatments and interventions, ordering and review of laboratory studies, ordering and review of radiographic studies, pulse oximetry and re-evaluation of patient's condition.  Loralie Champagne 10/14/2020 7:35 AM

## 2020-10-15 ENCOUNTER — Inpatient Hospital Stay (HOSPITAL_COMMUNITY): Payer: Medicare HMO

## 2020-10-15 DIAGNOSIS — Z95811 Presence of heart assist device: Secondary | ICD-10-CM | POA: Diagnosis not present

## 2020-10-15 DIAGNOSIS — I5023 Acute on chronic systolic (congestive) heart failure: Secondary | ICD-10-CM | POA: Diagnosis not present

## 2020-10-15 LAB — PHOSPHORUS: Phosphorus: 4.7 mg/dL — ABNORMAL HIGH (ref 2.5–4.6)

## 2020-10-15 LAB — CBC WITH DIFFERENTIAL/PLATELET
Abs Immature Granulocytes: 0.61 10*3/uL — ABNORMAL HIGH (ref 0.00–0.07)
Basophils Absolute: 0.1 10*3/uL (ref 0.0–0.1)
Basophils Relative: 0 %
Eosinophils Absolute: 0 10*3/uL (ref 0.0–0.5)
Eosinophils Relative: 0 %
HCT: 25.5 % — ABNORMAL LOW (ref 39.0–52.0)
Hemoglobin: 8.2 g/dL — ABNORMAL LOW (ref 13.0–17.0)
Immature Granulocytes: 2 %
Lymphocytes Relative: 7 %
Lymphs Abs: 1.8 10*3/uL (ref 0.7–4.0)
MCH: 27.9 pg (ref 26.0–34.0)
MCHC: 32.2 g/dL (ref 30.0–36.0)
MCV: 86.7 fL (ref 80.0–100.0)
Monocytes Absolute: 2.8 10*3/uL — ABNORMAL HIGH (ref 0.1–1.0)
Monocytes Relative: 10 %
Neutro Abs: 22.6 10*3/uL — ABNORMAL HIGH (ref 1.7–7.7)
Neutrophils Relative %: 81 %
Platelets: 184 10*3/uL (ref 150–400)
RBC: 2.94 MIL/uL — ABNORMAL LOW (ref 4.22–5.81)
RDW: 16.8 % — ABNORMAL HIGH (ref 11.5–15.5)
WBC: 27.9 10*3/uL — ABNORMAL HIGH (ref 4.0–10.5)
nRBC: 1.1 % — ABNORMAL HIGH (ref 0.0–0.2)

## 2020-10-15 LAB — COMPREHENSIVE METABOLIC PANEL
ALT: 38 U/L (ref 0–44)
AST: 188 U/L — ABNORMAL HIGH (ref 15–41)
Albumin: 2.6 g/dL — ABNORMAL LOW (ref 3.5–5.0)
Alkaline Phosphatase: 71 U/L (ref 38–126)
Anion gap: 10 (ref 5–15)
BUN: 29 mg/dL — ABNORMAL HIGH (ref 6–20)
CO2: 22 mmol/L (ref 22–32)
Calcium: 8.2 mg/dL — ABNORMAL LOW (ref 8.9–10.3)
Chloride: 106 mmol/L (ref 98–111)
Creatinine, Ser: 2.08 mg/dL — ABNORMAL HIGH (ref 0.61–1.24)
GFR, Estimated: 36 mL/min — ABNORMAL LOW (ref >60)
Glucose, Bld: 122 mg/dL — ABNORMAL HIGH (ref 70–99)
Potassium: 4.5 mmol/L (ref 3.5–5.1)
Sodium: 138 mmol/L (ref 135–145)
Total Bilirubin: 3.5 mg/dL — ABNORMAL HIGH (ref 0.3–1.2)
Total Protein: 6 g/dL — ABNORMAL LOW (ref 6.5–8.1)

## 2020-10-15 LAB — GLUCOSE, CAPILLARY
Glucose-Capillary: 109 mg/dL — ABNORMAL HIGH (ref 70–99)
Glucose-Capillary: 109 mg/dL — ABNORMAL HIGH (ref 70–99)
Glucose-Capillary: 110 mg/dL — ABNORMAL HIGH (ref 70–99)
Glucose-Capillary: 116 mg/dL — ABNORMAL HIGH (ref 70–99)
Glucose-Capillary: 119 mg/dL — ABNORMAL HIGH (ref 70–99)
Glucose-Capillary: 119 mg/dL — ABNORMAL HIGH (ref 70–99)
Glucose-Capillary: 122 mg/dL — ABNORMAL HIGH (ref 70–99)
Glucose-Capillary: 128 mg/dL — ABNORMAL HIGH (ref 70–99)
Glucose-Capillary: 128 mg/dL — ABNORMAL HIGH (ref 70–99)
Glucose-Capillary: 135 mg/dL — ABNORMAL HIGH (ref 70–99)
Glucose-Capillary: 139 mg/dL — ABNORMAL HIGH (ref 70–99)
Glucose-Capillary: 159 mg/dL — ABNORMAL HIGH (ref 70–99)
Glucose-Capillary: 159 mg/dL — ABNORMAL HIGH (ref 70–99)

## 2020-10-15 LAB — COOXEMETRY PANEL
Carboxyhemoglobin: 1.3 % (ref 0.5–1.5)
Methemoglobin: 1.1 % (ref 0.0–1.5)
O2 Saturation: 63 %
Total hemoglobin: 8 g/dL — ABNORMAL LOW (ref 12.0–16.0)

## 2020-10-15 LAB — CULTURE, BLOOD (ROUTINE X 2)
Culture: NO GROWTH
Culture: NO GROWTH
Special Requests: ADEQUATE
Special Requests: ADEQUATE

## 2020-10-15 LAB — LACTATE DEHYDROGENASE: LDH: 597 U/L — ABNORMAL HIGH (ref 98–192)

## 2020-10-15 LAB — PROTIME-INR
INR: 1.6 — ABNORMAL HIGH (ref 0.8–1.2)
Prothrombin Time: 18.2 seconds — ABNORMAL HIGH (ref 11.4–15.2)

## 2020-10-15 LAB — MAGNESIUM: Magnesium: 2.4 mg/dL (ref 1.7–2.4)

## 2020-10-15 MED ORDER — OXYCODONE HCL 5 MG PO TABS
5.0000 mg | ORAL_TABLET | ORAL | Status: DC | PRN
  Administered 2020-10-20: 5 mg via ORAL
  Administered 2020-10-20 – 2020-10-26 (×3): 10 mg via ORAL
  Administered 2020-10-26: 5 mg via ORAL
  Administered 2020-10-27 – 2020-10-28 (×2): 10 mg via ORAL
  Filled 2020-10-15 (×4): qty 2
  Filled 2020-10-15 (×3): qty 1
  Filled 2020-10-15 (×2): qty 2
  Filled 2020-10-15: qty 1

## 2020-10-15 MED ORDER — AMIODARONE LOAD VIA INFUSION
150.0000 mg | Freq: Once | INTRAVENOUS | Status: AC
  Administered 2020-10-15: 150 mg via INTRAVENOUS
  Filled 2020-10-15: qty 83.34

## 2020-10-15 MED ORDER — TRAMADOL HCL 50 MG PO TABS: 50.0000 mg | ORAL_TABLET | Freq: Four times a day (QID) | ORAL | Status: DC | PRN

## 2020-10-15 MED ORDER — DOCUSATE SODIUM 100 MG PO CAPS
100.0000 mg | ORAL_CAPSULE | Freq: Every day | ORAL | Status: DC
  Administered 2020-10-16 – 2020-10-18 (×3): 100 mg via ORAL
  Filled 2020-10-15 (×3): qty 1

## 2020-10-15 MED ORDER — TRAMADOL HCL 50 MG PO TABS
50.0000 mg | ORAL_TABLET | Freq: Four times a day (QID) | ORAL | Status: DC | PRN
Start: 2020-10-15 — End: 2020-10-29
  Administered 2020-10-16 – 2020-10-29 (×7): 50 mg via ORAL
  Filled 2020-10-15 (×7): qty 1

## 2020-10-15 MED ORDER — SODIUM CHLORIDE 0.9% FLUSH
10.0000 mL | Freq: Two times a day (BID) | INTRAVENOUS | Status: DC
  Administered 2020-10-15 – 2020-10-21 (×9): 10 mL
  Administered 2020-10-22: 40 mL
  Administered 2020-10-22 – 2020-10-24 (×4): 10 mL
  Administered 2020-10-24: 20 mL
  Administered 2020-10-25 – 2020-10-30 (×11): 10 mL
  Administered 2020-10-31: 30 mL
  Administered 2020-10-31 – 2020-11-02 (×3): 10 mL
  Administered 2020-11-03: 30 mL
  Administered 2020-11-03 – 2020-11-06 (×6): 10 mL
  Administered 2020-11-07: 30 mL
  Administered 2020-11-07 – 2020-11-14 (×14): 10 mL

## 2020-10-15 MED ORDER — MIDODRINE HCL 5 MG PO TABS
10.0000 mg | ORAL_TABLET | Freq: Three times a day (TID) | ORAL | Status: DC
  Administered 2020-10-16 – 2020-10-25 (×28): 10 mg via ORAL
  Filled 2020-10-15 (×29): qty 2

## 2020-10-15 MED ORDER — SODIUM CHLORIDE 0.9% FLUSH
10.0000 mL | INTRAVENOUS | Status: DC | PRN
  Administered 2020-10-21 – 2020-10-26 (×2): 20 mL

## 2020-10-15 MED ORDER — WARFARIN - PHYSICIAN DOSING INPATIENT: Freq: Every day | Status: DC

## 2020-10-15 MED ORDER — INSULIN ASPART 100 UNIT/ML ~~LOC~~ SOLN
0.0000 [IU] | SUBCUTANEOUS | Status: DC
  Administered 2020-10-15: 2 [IU] via SUBCUTANEOUS
  Administered 2020-10-15 – 2020-10-16 (×2): 3 [IU] via SUBCUTANEOUS
  Administered 2020-10-16 (×2): 2 [IU] via SUBCUTANEOUS
  Administered 2020-10-16: 3 [IU] via SUBCUTANEOUS
  Administered 2020-10-16: 2 [IU] via SUBCUTANEOUS
  Administered 2020-10-16: 3 [IU] via SUBCUTANEOUS
  Administered 2020-10-17 – 2020-10-18 (×2): 2 [IU] via SUBCUTANEOUS
  Administered 2020-10-18 (×2): 3 [IU] via SUBCUTANEOUS
  Administered 2020-10-18: 5 [IU] via SUBCUTANEOUS
  Administered 2020-10-18: 3 [IU] via SUBCUTANEOUS
  Administered 2020-10-19: 2 [IU] via SUBCUTANEOUS
  Administered 2020-10-19: 5 [IU] via SUBCUTANEOUS
  Administered 2020-10-19: 3 [IU] via SUBCUTANEOUS
  Administered 2020-10-19: 5 [IU] via SUBCUTANEOUS
  Administered 2020-10-20 (×2): 3 [IU] via SUBCUTANEOUS
  Administered 2020-10-20: 2 [IU] via SUBCUTANEOUS
  Administered 2020-10-20: 3 [IU] via SUBCUTANEOUS
  Administered 2020-10-20: 2 [IU] via SUBCUTANEOUS
  Administered 2020-10-20: 5 [IU] via SUBCUTANEOUS
  Administered 2020-10-21 (×5): 3 [IU] via SUBCUTANEOUS
  Administered 2020-10-22: 5 [IU] via SUBCUTANEOUS

## 2020-10-15 MED ORDER — AMIODARONE LOAD VIA INFUSION
150.0000 mg | Freq: Once | INTRAVENOUS | Status: AC
  Administered 2020-10-15: 150 mg via INTRAVENOUS

## 2020-10-15 MED ORDER — METOLAZONE 2.5 MG PO TABS
2.5000 mg | ORAL_TABLET | Freq: Once | ORAL | Status: DC
  Filled 2020-10-15 (×2): qty 1

## 2020-10-15 MED ORDER — INSULIN DETEMIR 100 UNIT/ML ~~LOC~~ SOLN
20.0000 [IU] | Freq: Two times a day (BID) | SUBCUTANEOUS | Status: DC
  Administered 2020-10-15 – 2020-10-16 (×4): 20 [IU] via SUBCUTANEOUS
  Filled 2020-10-15 (×6): qty 0.2

## 2020-10-15 MED ORDER — WARFARIN SODIUM 2.5 MG PO TABS
2.5000 mg | ORAL_TABLET | Freq: Every day | ORAL | Status: DC
  Administered 2020-10-15: 2.5 mg via ORAL
  Filled 2020-10-15: qty 1

## 2020-10-15 MED ORDER — PANTOPRAZOLE SODIUM 40 MG PO TBEC
40.0000 mg | DELAYED_RELEASE_TABLET | Freq: Every day | ORAL | Status: DC
  Administered 2020-10-16 – 2020-11-14 (×30): 40 mg via ORAL
  Filled 2020-10-15 (×30): qty 1

## 2020-10-15 MED ORDER — ADULT MULTIVITAMIN W/MINERALS CH
1.0000 | ORAL_TABLET | Freq: Every day | ORAL | Status: DC
  Administered 2020-10-17 – 2020-10-19 (×3): 1 via ORAL
  Filled 2020-10-15 (×4): qty 1

## 2020-10-15 NOTE — Progress Notes (Signed)
Patient ID: Marcus Johnson., male   DOB: 20-Dec-1960, 60 y.o.   MRN: [id number]      Advanced Heart Failure Rounding Note  PCP-Cardiologist: No primary care provider on file.   Subjective:    - 2/27 Milrinone switched to dobutamine 5 mcg.  - 2/28 Moved to ICU. Dobutamine increased to 7.5 mcg and Norepi 3 mcg added. Diuretics held.  - 3/1 Impella 5.5 placed.  - 3/4 Teeth removed - 3/10 HM3 LVAD placed  POD #2 VAD   On milrinone 0.25, NE 13, epi 7, vaso 0.03 with CI 2.1, Co-ox 63. CVP 16-18, PA 35/18  Sitting up in bed with assistance. Mediastinal tubes dry. Mild drainage from pleural tubes. On lasix gtt at 8. Diuresing about 100/hr. Weigh up about 40 pounds from baseline    Echo: EF 20-25%, normal RV, PASP 61, dilated IVC.    Objective:   Weight Range: 100.6 kg Body mass index is 31.37 kg/m.   Vital Signs:   Temp:  [98.6 F (37 C)-99.5 F (37.5 C)] 99.32 F (37.4 C) (03/12 1030) Pulse Rate:  [26-91] 88 (03/12 1030) Resp:  [0-24] 23 (03/12 1030) SpO2:  [84 %-100 %] 84 % (03/12 1030) Arterial Line BP: (70-87)/(48-63) 80/55 (03/12 1030) Weight:  [100.6 kg] 100.6 kg (03/12 0400) Last BM Date: 10/12/20  Weight change: Filed Weights   10/13/20 0500 10/14/20 0500 10/15/20 0400  Weight: 90.8 kg 90.8 kg 100.6 kg    Intake/Output:   Intake/Output Summary (Last 24 hours) at 10/15/2020 1124 Last data filed at 10/15/2020 1100 Gross per 24 hour  Intake 2940.82 ml  Output 3327 ml  Net -386.18 ml      Physical Exam   General:  Sitting up in bed with assistance  HEENT: normal  Neck: supple. RIJ swan  Carotids 2+ bilat; no bruits. No lymphadenopathy or thryomegaly appreciated. Cor: LVAD hum.  Sternal dressing ok. + CTs Lungs: Decreased at bases  Abdomen: obese soft, nontender, + distended. Quiet  Driveline site dressing ok  Extremities: no cyanosis, clubbing, rash. Warm 2+ edema  Neuro: alert & oriented x 3. No focal deficits.   Telemetry   Atrial  fibrillation 80-90s Personally reviewed  Labs    CBC Recent Labs    10/14/20 0254 10/14/20 0258 10/14/20 1655 10/14/20 1701 10/15/20 0410  WBC 19.7*   < > 24.3*  --  27.9*  NEUTROABS 14.7*  --   --   --  22.6*  HGB 8.2*   < > 8.9* 9.2* 8.2*  HCT 25.0*   < > 26.9* 27.0* 25.5*  MCV 84.5   < > 84.9  --  86.7  PLT 119*   < > 165  --  184   < > = values in this interval not displayed.   Basic Metabolic Panel Recent Labs    10/14/20 0254 10/14/20 0258 10/14/20 1655 10/14/20 1701 10/15/20 0410  NA 136   < > 138 139 138  K 4.7   < > 4.7 4.7 4.5  CL 106  --  106  --  106  CO2 22  --  23  --  22  GLUCOSE 146*  --  112*  --  122*  BUN 26*  --  28*  --  29*  CREATININE 1.84*  --  1.90*  --  2.08*  CALCIUM 8.5*  --  8.2*  --  8.2*  MG 2.4  --  2.5*  --  2.4  PHOS 3.6  --   --   --  4.7*   < > = values in this interval not displayed.   Liver Function Tests Recent Labs    10/14/20 0254 10/15/20 0410  AST 138* 188*  ALT 29 38  ALKPHOS 56 71  BILITOT 3.5* 3.5*  PROT 5.5* 6.0*  ALBUMIN 3.1* 2.6*   No results for input(s): LIPASE, AMYLASE in the last 72 hours. Cardiac Enzymes No results for input(s): CKTOTAL, CKMB, CKMBINDEX, TROPONINI in the last 72 hours.  BNP: BNP (last 3 results) Recent Labs    10/14/20 0254  BNP 1,097.8*    ProBNP (last 3 results) No results for input(s): PROBNP in the last 8760 hours.   D-Dimer Recent Labs    10/13/20 2112  DDIMER 0.88*   Hemoglobin A1C Recent Labs    10/12/20 1827  HGBA1C 7.8*   Fasting Lipid Panel No results for input(s): CHOL, HDL, LDLCALC, TRIG, CHOLHDL, LDLDIRECT in the last 72 hours. Thyroid Function Tests No results for input(s): TSH, T4TOTAL, T3FREE, THYROIDAB in the last 72 hours.  Invalid input(s): FREET3  Other results:   Imaging    DG CHEST PORT 1 VIEW  Result Date: 10/15/2020 CLINICAL DATA:  Status post extubation. Left ventricular assist device present. EXAM: PORTABLE CHEST 1 VIEW  COMPARISON:  October 14, 2020 FINDINGS: Endotracheal tube and nasogastric tube have been removed. Swan-Ganz catheter tip is in the right main pulmonary artery. Pacemaker leads are attached to the right atrium and right ventricle. Left ventricular assist device present. Chest tube present on the right. No pneumothorax. There is atelectatic change in the left lower lung region, stable. There is also mild atelectasis in the medial right base. Lungs otherwise are clear. There is cardiomegaly, stable. Pulmonary vascularity is normal. No adenopathy. Status post coronary artery bypass grafting. No bone lesions. IMPRESSION: Postoperative changes. Tube and catheter positions as described without pneumothorax. Persistent left lower lobe atelectasis. There is mild medial right base atelectasis currently. Stable cardiomegaly. Electronically Signed   By: Lowella Grip III M.D.   On: 10/15/2020 08:53     Medications:     Scheduled Medications: . acetaminophen  1,000 mg Oral Q6H   Or  . acetaminophen (TYLENOL) oral liquid 160 mg/5 mL  1,000 mg Per Tube Q6H  . aspirin EC  325 mg Oral Daily   Or  . aspirin  324 mg Per Tube Daily   Or  . aspirin  300 mg Rectal Daily  . bisacodyl  10 mg Oral Daily   Or  . bisacodyl  10 mg Rectal Daily  . Chlorhexidine Gluconate Cloth  6 each Topical Daily  . docusate sodium  100 mg Oral Daily  . insulin aspart  0-15 Units Subcutaneous Q4H  . insulin detemir  20 Units Subcutaneous BID  . lidocaine  1 patch Transdermal Q24H  . mouth rinse  15 mL Mouth Rinse BID  . metoCLOPramide (REGLAN) injection  10 mg Intravenous Q12H  . metolazone  2.5 mg Oral Once  . midodrine  10 mg Oral TID WC  . multivitamin with minerals  1 tablet Oral Daily  . pantoprazole  40 mg Oral Daily  . sodium chloride flush  10-40 mL Intracatheter Q12H  . sodium chloride flush  3 mL Intravenous Q12H  . warfarin  2.5 mg Oral q1600  . Warfarin - Physician Dosing Inpatient   Does not apply q1600     Infusions: . sodium chloride Stopped (10/15/20 0755)  . sodium chloride    . sodium chloride 10 mL/hr at 10/14/20 1040  .  amiodarone 30 mg/hr (10/15/20 0900)  . epinephrine 7 mcg/min (10/15/20 0900)  . furosemide (LASIX) 200 mg in dextrose 5% 100 mL (2mg /mL) infusion 8 mg/hr (10/15/20 0900)  . lactated ringers    . lactated ringers 20 mL/hr at 10/15/20 0900  . milrinone 0.25 mcg/kg/min (10/15/20 0900)  . norepinephrine (LEVOPHED) Adult infusion 13 mcg/min (10/15/20 0900)  . vasopressin 0.03 Units/min (10/15/20 0900)    PRN Medications: sodium chloride, dextrose, morphine injection, ondansetron (ZOFRAN) IV, oxyCODONE, sodium chloride flush, sodium chloride flush, traMADol    Assessment/Plan   1.Acute on chronic systolic HF -> cardiogenic shock:ICM.HasMedtronic ICD.EF 15%. Low output persisted despite milrinone and then dobutamine with rise in creatinine to 2.5. Impella 5.5 placed 3/1.  Creatinine improved to 1.7 and HM3 LVAD was placed on 3/10. Patient currently on milrinone 0.25, NE 13, epi 7, vaso 0.03 with CI 2.1, Co-ox 63. CVP 16-18, PA 35/18 - POD #2 - Markedly volume overloaded. Give metolazone x 1. Increase lasix gtt 12 2. LVAD - POD #2 - LVAD speed at 5200 rpm titrate as tolerated - ASA 325 until INR up to 2.  - Will start anticoagulation when surgery ok's.  3. CAD s/p CABG2002:Last cath in 6/19 with patent grafts.  - Continue Crestor 40 mg daily.  4. AKI on CKD Stage 3b: I suspect that this is a combination of cardiorenal syndrome and diabetic nephropathy and possibly ATN. Creatinine 1.9 -> 2.8 - continue hemodynamic support. Follow renal function closely  5. Atrial flutter/fibrillation: s/p DC-CV 08/21/18.He has been in rate-controlled atrial fibrillation.  - Continue amiodarone 200 mg bid.   - Will eventually resume anticoagulation.  6. Wound R Foot: Partial thickness skin loss noted. Cover with mepilex border and change every 5 days.  7. Right arm  pain: Think neuropathic, per Dr. Cyndia Bent patient had stretch of brachial plexus with Impella placement. Improved.  - Improved with impella out  8. ID: Afebrile, negative cultures.  9. EX:[id number] of very poor control but hgbA1ctrending down. Most recent 7.7%  - Cover with SSI.   CRITICAL CARE Performed by: Glori Bickers  Total critical care time: 40 minutes  Critical care time was exclusive of separately billable procedures and treating other patients.  Critical care was necessary to treat or prevent imminent or life-threatening deterioration.  Critical care was time spent personally by me on the following activities: development of treatment plan with patient and/or surrogate as well as nursing, discussions with consultants, evaluation of patient's response to treatment, examination of patient, obtaining history from patient or surrogate, ordering and performing treatments and interventions, ordering and review of laboratory studies, ordering and review of radiographic studies, pulse oximetry and re-evaluation of patient's condition.   Length of Stay: 17  Glori Bickers, MD  10/15/2020, 11:24 AM  Advanced Heart Failure Team Pager [phone] (M-F; 7a - 4p)  Please contact [facility] for night-coverage after hours (4p -7a ) and weekends on [email or web address]

## 2020-10-15 NOTE — Progress Notes (Signed)
OT Cancellation Note  Patient Details Name: Marcus Johnson. MRN: [id number] DOB: 03/20/1961   Cancelled Treatment:    Reason Eval/Treat Not Completed: Medical issues which prohibited therapy.  Patient went into to afib with rate up to 120s, OT to continue efforts as appropriate.    [name] D Alireza Pollack 10/15/2020, 3:02 PM

## 2020-10-15 NOTE — Progress Notes (Signed)
Patient went into to afib with rate up to 120s at 1400. RN assessed rhythm on monitor and obtained EKG which said rapid afib as well.   RN paged Dr. Cyndia Bent. Verbal order for amio bolus 150mg  given.   Amio bolus given. Patient still in afib.

## 2020-10-15 NOTE — Progress Notes (Signed)
Drive line Dressing Change:  Existing VAD dressing removed and site care performed using sterile technique. Drive line exit site cleaned with Chlora prep applicators x 2, allowed to dry, silver strip and guaze dressing re-applied. Moderate amount of dried blood noted on gauze dressing. 1 suture visible and wrapped around the driveline. No redness, tenderness, foul odor or rash noted. New drive line anchor correctly applied.   Continue daily dressing changes by VAD coordinator or nurse champion. Next dressing change is due 10/16/20.

## 2020-10-15 NOTE — Progress Notes (Signed)
Patient ID: Marcus Johnson., male   DOB: 20-Oct-1960, 60 y.o.   MRN: [id number] T[facility] Evening Rounds:  Hemodynamically stable Epi 7 NE 11 Vaso 0.03 Milrinone 0.25  Has not been able to take midodrine yet due to nausea.  VAD parameters all stable with flow 4.8.  Lasix drip at 12, slightly positive today. UO 100+ per hr.

## 2020-10-15 NOTE — Progress Notes (Signed)
PT Cancellation Note  Patient Details Name: Marcus Johnson. MRN: [id number] DOB: [birth year]   Cancelled Treatment:    Reason Eval/Treat Not Completed: Medical issues which prohibited therapy.  Pt stated this afternoon pt is in afib and it is not a good time to see him presently.  Will check back later. 10/15/2020  Ginger Carne., PT Acute Rehabilitation Services [phone]  (pager) [phone]  (office)   Tessie Fass [place] 10/15/2020, 2:17 PM

## 2020-10-15 NOTE — Progress Notes (Signed)
Upon turning patient and assessing under sacral foam, patient's skin noted to be very friable.  RN assessed with other RN to be sure of no pressure injury present. RN adjusted sacral foam. Patient does have c/o his buttocks being sore. RN will continue to monitor patient closely and will continue to reposition him in the bed. He is also able move his hips in the bed himself some as well.

## 2020-10-15 NOTE — Progress Notes (Signed)
Patient ID: Edy Belt., male   DOB: [birth year], 60 y.o.   MRN: [id number] HeartMate 3 Rounding Note  Subjective:    Stable overnight. Feels ok this am.  On milrinone 0.25, NE 13, epi 7, vaso 0.03 with CI 2.1, Co-ox 63. CVP 16, PA 35/18  + 911 cc/24 hrs. Wt recorded at 222 with bed scale. 45 lbs over preop if accurate.  LVAD INTERROGATION:  HeartMate IIl LVAD:  Flow 4.7 liters/min, speed 5300, power 3.7, PI 1.9.    Objective:    Vital Signs:   Temp:  [98.6 F (37 C)-99.5 F (37.5 C)] 98.96 F (37.2 C) (03/12 0800) Pulse Rate:  [26-91] 34 (03/12 0800) Resp:  [0-24] 24 (03/12 0800) SpO2:  [87 %-100 %] 97 % (03/12 0800) Arterial Line BP: (72-88)/(48-63) 73/51 (03/12 0800) Weight:  [100.6 kg] 100.6 kg (03/12 0400) Last BM Date: 10/12/20 Mean arterial Pressure 68-70  Intake/Output:   Intake/Output Summary (Last 24 hours) at 10/15/2020 6761 Last data filed at 10/15/2020 0800 Gross per 24 hour  Intake 3896.08 ml  Output 3070 ml  Net 826.08 ml     Physical Exam: General:  Well appearing. No resp difficulty HEENT: normal Cor: distant heart sounds with LVAD hum present. Lungs: clear Abdomen: soft, nontender, nondistended. + bowel sounds. Extremities: anasarca Neuro: alert & orientedx3, cranial nerves grossly intact. moves all 4 extremities w/o difficulty. Affect pleasant. Weak right grip as noted preop due to brachial plexus pressure.  Telemetry: sinus 87  Labs: Basic Metabolic Panel: Recent Labs  Lab 10/13/20 1651 10/13/20 1759 10/13/20 2112 10/13/20 2117 10/14/20 0254 10/14/20 0258 10/14/20 1549 10/14/20 1655 10/14/20 1701 10/15/20 0410  NA 136   < > 136   < > 136 139 139 138 139 138  K 3.6   < > 4.1   < > 4.7 4.7 4.8 4.7 4.7 4.5  CL 105  --  107  --  106  --   --  106  --  106  CO2 23  --  22  --  22  --   --  23  --  22  GLUCOSE 144*  --  133*  --  146*  --   --  112*  --  122*  BUN 25*  --  25*  --  26*  --   --  28*  --  29*  CREATININE 1.77*   --  1.75*  --  1.84*  --   --  1.90*  --  2.08*  CALCIUM 8.8*  --  8.6*  --  8.5*  --   --  8.2*  --  8.2*  MG 1.8  --  2.6*  --  2.4  --   --  2.5*  --  2.4  PHOS  --   --   --   --  3.6  --   --   --   --  4.7*   < > = values in this interval not displayed.    Liver Function Tests: Recent Labs  Lab 10/12/20 1826 10/14/20 0254 10/15/20 0410  AST 31 138* 188*  ALT 17 29 38  ALKPHOS 136* 56 71  BILITOT 1.3* 3.5* 3.5*  PROT 6.7 5.5* 6.0*  ALBUMIN 2.5* 3.1* 2.6*   No results for input(s): LIPASE, AMYLASE in the last 168 hours. No results for input(s): AMMONIA in the last 168 hours.  CBC: Recent Labs  Lab 10/13/20 2112 10/13/20 2117 10/14/20 0254 10/14/20 0258 10/14/20 9509  10/14/20 1549 10/14/20 1655 10/14/20 1701 10/15/20 0410  WBC 22.0*  --  19.7*  --  19.8*  --  24.3*  --  27.9*  NEUTROABS  --   --  14.7*  --   --   --   --   --  22.6*  HGB 7.3*   < > 8.2*   < > 9.2* 9.2* 8.9* 9.2* 8.2*  HCT 22.3*   < > 25.0*   < > 26.7* 27.0* 26.9* 27.0* 25.5*  MCV 82.6  --  84.5  --  82.7  --  84.9  --  86.7  PLT 123*  119*  --  119*  --  131*  --  165  --  184   < > = values in this interval not displayed.    INR: Recent Labs  Lab 10/12/20 1826 10/13/20 1651 10/13/20 2112 10/14/20 0254 10/15/20 0410  INR 1.4* 1.6* 1.5* 1.4* 1.6*    Other results:  EKG:   Imaging: DG Chest Port 1 View  Result Date: 10/14/2020 CLINICAL DATA:  Hypoxia EXAM: PORTABLE CHEST 1 VIEW COMPARISON:  October 13, 2020. FINDINGS: Endotracheal tube tip is 3.7 cm above the carina. Nasogastric tube tip and side port are below the diaphragm. Swan-Ganz catheter tip is in the right main pulmonary artery. There is a chest tube on each side as well as a mediastinal drain. Pacemaker leads attached to right atrium and right ventricle. There is a left ventricular assist device present. No pneumothorax. There is atelectatic change in the left lower lobe. Lungs elsewhere clear. Heart is enlarged, stable.  Pulmonary vascularity is within normal limits. No evident adenopathy. No bone lesions. IMPRESSION: Tube and catheter positions as described without pneumothorax. Left ventricular assist device present. Pacemaker leads attached to right atrium and right ventricle. Left lower lobe atelectasis. Lungs otherwise clear. Stable cardiac silhouette. Electronically Signed   By: Lowella Grip III M.D.   On: 10/14/2020 08:22   DG CHEST PORT 1 VIEW  Result Date: 10/13/2020 CLINICAL DATA:  Status post chest tube placement EXAM: PORTABLE CHEST 1 VIEW COMPARISON:  Film from earlier in the same day. FINDINGS: Cardiac shadow is stable. Mediastinal drain, pericardial drain, gastric catheter, endotracheal tube and defibrillator are again noted. LVAD and Swan-Ganz catheter are again seen and stable. New right-sided chest tube is noted with resolution of previously seen pneumothorax and upper lobe collapse. No new focal abnormality is noted. IMPRESSION: Resolution of right-sided pneumothorax following chest tube placement. Electronically Signed   By: Inez Catalina M.D.   On: 10/13/2020 17:43   DG Chest Port 1 View  Result Date: 10/13/2020 CLINICAL DATA:  Status post LVAD placement EXAM: PORTABLE CHEST 1 VIEW COMPARISON:  10/10/2020 FINDINGS: Cardiac shadow is enlarged but stable. Impella catheter has been removed. Swan-Ganz catheter and defibrillator are again noted and stable. Postsurgical changes are again seen. Mediastinal and pericardial drain are noted as well. Endotracheal tube and gastric catheter are noted in satisfactory position. Left ventricular assist device is noted also in satisfactory position. Left lung is well aerated. Right lung demonstrates an area of consolidation centrally and what appears to be a large superolateral pneumothorax. No bony abnormality is noted. IMPRESSION: Tubes and lines as described above. New right-sided pneumothorax with partial upper lobe collapse. The surgical team is aware of this  finding as a subsequent film following chest tube placement has been ordered. Electronically Signed   By: Inez Catalina M.D.   On: 10/13/2020 17:41  Medications:     Scheduled Medications: . acetaminophen  1,000 mg Oral Q6H   Or  . acetaminophen (TYLENOL) oral liquid 160 mg/5 mL  1,000 mg Per Tube Q6H  . aspirin EC  325 mg Oral Daily   Or  . aspirin  324 mg Per Tube Daily   Or  . aspirin  300 mg Rectal Daily  . bisacodyl  10 mg Oral Daily   Or  . bisacodyl  10 mg Rectal Daily  . Chlorhexidine Gluconate Cloth  6 each Topical Daily  . docusate  200 mg Per Tube Daily  . lidocaine  1 patch Transdermal Q24H  . mouth rinse  15 mL Mouth Rinse BID  . metoCLOPramide (REGLAN) injection  10 mg Intravenous Q12H  . multivitamin  15 mL Per Tube Daily  . pantoprazole sodium  40 mg Per Tube Daily  . sodium chloride flush  10-40 mL Intracatheter Q12H  . sodium chloride flush  3 mL Intravenous Q12H  . warfarin  2.5 mg Oral q1600     Infusions: . sodium chloride Stopped (10/15/20 0755)  . sodium chloride    . sodium chloride 10 mL/hr at 10/14/20 1040  . amiodarone 30 mg/hr (10/15/20 0800)  . cefUROXime (ZINACEF)  IV Stopped (10/14/20 2009)  . epinephrine 7 mcg/min (10/15/20 0800)  . furosemide (LASIX) 200 mg in dextrose 5% 100 mL (2mg /mL) infusion 8 mg/hr (10/15/20 0800)  . lactated ringers    . lactated ringers 20 mL/hr at 10/15/20 0800  . milrinone 0.25 mcg/kg/min (10/15/20 0800)  . norepinephrine (LEVOPHED) Adult infusion 13 mcg/min (10/15/20 0800)  . vancomycin 150 mL/hr at 10/15/20 0800  . vasopressin 0.03 Units/min (10/15/20 0800)     PRN Medications:  sodium chloride, dextrose, morphine injection, ondansetron (ZOFRAN) IV, oxyCODONE, sodium chloride flush, sodium chloride flush, traMADol   Assessment:    1. Acute on chronic systolic congestive heart failure due to ischemic cardiomyopathy with ejection fraction of 20 to 25% admitted with class IIIb symptoms. POD 2 HM3  LVAD.  2. CAD s/p CABG 2002 with patent grafts on last cath.  3.   Stage 3 CKD due to diabetic nephropathy and cardiorenal syndrome. Baseline recently 1.8. creat up slightly from yesterday to 2.1 with diuresis on lasix drip.  4.  Atrial fib/flutter: maintaining sinus on amio po preop.  5. Right brachial plexus stretch from preop Impella and hematoma around the axillary artery. This should improve with time.  6. Expected acute postop blood loss anemia  Plan/Discussion:    He has been very stable on current drips overnight. He was very vasoplegic in OR and this continues postop. Will start midodrine when taking po. Wean vasopressin first as tolerated. Not sure if milrinone is contributing.   Continue lasix drip.  DC MT's. Keep other tubes today.  DC JP  Transition to Levemir and SSI.  IS, Dangle.  Start low dose Coumadin tonight.    Length of Stay: [place] 10/15/2020, 8:22 AM

## 2020-10-16 ENCOUNTER — Inpatient Hospital Stay (HOSPITAL_COMMUNITY): Payer: Medicare HMO

## 2020-10-16 DIAGNOSIS — Z95811 Presence of heart assist device: Secondary | ICD-10-CM | POA: Diagnosis not present

## 2020-10-16 DIAGNOSIS — L8990 Pressure ulcer of unspecified site, unspecified stage: Secondary | ICD-10-CM | POA: Insufficient documentation

## 2020-10-16 DIAGNOSIS — I5023 Acute on chronic systolic (congestive) heart failure: Secondary | ICD-10-CM | POA: Diagnosis not present

## 2020-10-16 LAB — TYPE AND SCREEN
ABO/RH(D): A NEG
Antibody Screen: NEGATIVE
Unit division: 0
Unit division: 0
Unit division: 0
Unit division: 0
Unit division: 0
Unit division: 0
Unit division: 0
Unit division: 0

## 2020-10-16 LAB — BPAM RBC
Blood Product Expiration Date: 202204102359
Blood Product Expiration Date: 202204102359
Blood Product Expiration Date: 202204102359
Blood Product Expiration Date: 202204102359
Blood Product Expiration Date: 202204102359
Blood Product Expiration Date: 202204102359
Blood Product Expiration Date: 202204112359
Blood Product Expiration Date: 202204112359
ISSUE DATE / TIME: 202203100753
ISSUE DATE / TIME: 202203100753
ISSUE DATE / TIME: 202203100753
ISSUE DATE / TIME: 202203100753
ISSUE DATE / TIME: 202203102220
ISSUE DATE / TIME: 202203102220
ISSUE DATE / TIME: 202203110436
Unit Type and Rh: 600
Unit Type and Rh: 600
Unit Type and Rh: 600
Unit Type and Rh: 600
Unit Type and Rh: 600
Unit Type and Rh: 600
Unit Type and Rh: 600
Unit Type and Rh: 600

## 2020-10-16 LAB — CBC WITH DIFFERENTIAL/PLATELET
Abs Immature Granulocytes: 0.72 10*3/uL — ABNORMAL HIGH (ref 0.00–0.07)
Basophils Absolute: 0.1 10*3/uL (ref 0.0–0.1)
Basophils Relative: 0 %
Eosinophils Absolute: 0 10*3/uL (ref 0.0–0.5)
Eosinophils Relative: 0 %
HCT: 22.7 % — ABNORMAL LOW (ref 39.0–52.0)
Hemoglobin: 7.3 g/dL — ABNORMAL LOW (ref 13.0–17.0)
Immature Granulocytes: 2 %
Lymphocytes Relative: 6 %
Lymphs Abs: 1.6 10*3/uL (ref 0.7–4.0)
MCH: 28.2 pg (ref 26.0–34.0)
MCHC: 32.2 g/dL (ref 30.0–36.0)
MCV: 87.6 fL (ref 80.0–100.0)
Monocytes Absolute: 2.6 10*3/uL — ABNORMAL HIGH (ref 0.1–1.0)
Monocytes Relative: 9 %
Neutro Abs: 24.4 10*3/uL — ABNORMAL HIGH (ref 1.7–7.7)
Neutrophils Relative %: 83 %
Platelets: 232 10*3/uL (ref 150–400)
RBC: 2.59 MIL/uL — ABNORMAL LOW (ref 4.22–5.81)
RDW: 17.8 % — ABNORMAL HIGH (ref 11.5–15.5)
WBC: 29.4 10*3/uL — ABNORMAL HIGH (ref 4.0–10.5)
nRBC: 1 % — ABNORMAL HIGH (ref 0.0–0.2)

## 2020-10-16 LAB — COMPREHENSIVE METABOLIC PANEL
ALT: 51 U/L — ABNORMAL HIGH (ref 0–44)
AST: 145 U/L — ABNORMAL HIGH (ref 15–41)
Albumin: 2.4 g/dL — ABNORMAL LOW (ref 3.5–5.0)
Alkaline Phosphatase: 82 U/L (ref 38–126)
Anion gap: 12 (ref 5–15)
BUN: 38 mg/dL — ABNORMAL HIGH (ref 6–20)
CO2: 21 mmol/L — ABNORMAL LOW (ref 22–32)
Calcium: 8 mg/dL — ABNORMAL LOW (ref 8.9–10.3)
Chloride: 105 mmol/L (ref 98–111)
Creatinine, Ser: 2.54 mg/dL — ABNORMAL HIGH (ref 0.61–1.24)
GFR, Estimated: 28 mL/min — ABNORMAL LOW (ref >60)
Glucose, Bld: 159 mg/dL — ABNORMAL HIGH (ref 70–99)
Potassium: 3.9 mmol/L (ref 3.5–5.1)
Sodium: 138 mmol/L (ref 135–145)
Total Bilirubin: 3.1 mg/dL — ABNORMAL HIGH (ref 0.3–1.2)
Total Protein: 6.2 g/dL — ABNORMAL LOW (ref 6.5–8.1)

## 2020-10-16 LAB — COOXEMETRY PANEL
Carboxyhemoglobin: 1.6 % — ABNORMAL HIGH (ref 0.5–1.5)
Methemoglobin: 1.2 % (ref 0.0–1.5)
O2 Saturation: 53.3 %
Total hemoglobin: 7.4 g/dL — ABNORMAL LOW (ref 12.0–16.0)

## 2020-10-16 LAB — PREPARE RBC (CROSSMATCH)

## 2020-10-16 LAB — PROTIME-INR
INR: 1.7 — ABNORMAL HIGH (ref 0.8–1.2)
Prothrombin Time: 19.1 seconds — ABNORMAL HIGH (ref 11.4–15.2)

## 2020-10-16 LAB — GLUCOSE, CAPILLARY
Glucose-Capillary: 106 mg/dL — ABNORMAL HIGH (ref 70–99)
Glucose-Capillary: 130 mg/dL — ABNORMAL HIGH (ref 70–99)
Glucose-Capillary: 145 mg/dL — ABNORMAL HIGH (ref 70–99)
Glucose-Capillary: 149 mg/dL — ABNORMAL HIGH (ref 70–99)
Glucose-Capillary: 150 mg/dL — ABNORMAL HIGH (ref 70–99)
Glucose-Capillary: 155 mg/dL — ABNORMAL HIGH (ref 70–99)
Glucose-Capillary: 158 mg/dL — ABNORMAL HIGH (ref 70–99)

## 2020-10-16 LAB — PHOSPHORUS: Phosphorus: 4.6 mg/dL (ref 2.5–4.6)

## 2020-10-16 LAB — LACTATE DEHYDROGENASE: LDH: 593 U/L — ABNORMAL HIGH (ref 98–192)

## 2020-10-16 LAB — CALCIUM, IONIZED: Calcium, Ionized, Serum: 4.8 mg/dL (ref 4.5–5.6)

## 2020-10-16 LAB — MAGNESIUM: Magnesium: 2.4 mg/dL (ref 1.7–2.4)

## 2020-10-16 MED ORDER — ENSURE ENLIVE PO LIQD
237.0000 mL | Freq: Two times a day (BID) | ORAL | Status: DC
  Administered 2020-10-16 – 2020-10-20 (×9): 237 mL via ORAL

## 2020-10-16 MED ORDER — POTASSIUM CHLORIDE 10 MEQ/50ML IV SOLN
10.0000 meq | INTRAVENOUS | Status: AC
  Administered 2020-10-16 (×2): 10 meq via INTRAVENOUS
  Filled 2020-10-16 (×2): qty 50

## 2020-10-16 MED ORDER — METOCLOPRAMIDE HCL 5 MG/ML IJ SOLN
5.0000 mg | Freq: Two times a day (BID) | INTRAMUSCULAR | Status: DC
  Administered 2020-10-16 – 2020-10-31 (×32): 5 mg via INTRAVENOUS
  Filled 2020-10-16 (×33): qty 2

## 2020-10-16 MED ORDER — WARFARIN SODIUM 1 MG PO TABS
1.0000 mg | ORAL_TABLET | Freq: Every day | ORAL | Status: DC
  Administered 2020-10-16: 1 mg via ORAL
  Filled 2020-10-16: qty 1

## 2020-10-16 MED ORDER — SODIUM CHLORIDE 0.9% IV SOLUTION: Freq: Once | INTRAVENOUS | Status: DC

## 2020-10-16 MED ORDER — POTASSIUM CHLORIDE CRYS ER 20 MEQ PO TBCR: 20.0000 meq | EXTENDED_RELEASE_TABLET | Freq: Once | ORAL | Status: DC

## 2020-10-16 NOTE — Progress Notes (Signed)
Patient ID: Marcus Johnson., male   DOB: [birth year], 60 y.o.   MRN: [id number] TCTS evening rounds:  Speed turned up to 5500 today. Flow 5.1. MAP 76  Lasix increased to 20 but urine output remains 45-60/hr. May need CRRT if this does not increase with high PAP and CVP, volume overload.  He sat up and dangled, stood up for 1 minute but dizzy.

## 2020-10-16 NOTE — Progress Notes (Signed)
Drive line dressing change:   LVAD driveline dressing was changed using proper sterile technique. Old dressing removed. Moderate Sanguinous old drainage on dressing that was removed. Driveline site cleaned using chloroprep x2. Silver strip applied. New gauze dressing applied using sterile technique. Driveline secured at driveline site with 1 suture.Driveline site clean, no active drainage from site, no redness, no swelling, no tenderness, no odor from drainage. Driveline anchor intact and applied correctly.   NEXT DRESSING CHANGE DUE 10/17/2020.   Alma Friendly RN

## 2020-10-16 NOTE — Progress Notes (Addendum)
Patient ID: Marcus Johnson., male   DOB: Nov 04, 1960, 60 y.o.   MRN: [id number]      Advanced Heart Failure Rounding Note  PCP-Cardiologist: No primary care provider on file.   Subjective:    - 2/27 Milrinone switched to dobutamine 5 mcg.  - 2/28 Moved to ICU. Dobutamine increased to 7.5 mcg and Norepi 3 mcg added. Diuretics held.  - 3/1 Impella 5.5 placed.  - 3/4 Teeth removed - 3/10 HM3 LVAD placed  POD #3 VAD   Was confused and weak last night RRN paged. Felt to be ok.   On milrinone 0.25, NE 12, epi 7, vaso 0.03 with CI 2.1, Co-ox 63- > 53%.   Diuresing fairly well. Weight down 2 pounds (still up about 25 pounds from pre-op)  Creatinine 2.08 -> 2.54. WBC 28-> 29k  CXR stable (personally reviewed)  Hgb 7.3 - Getting 1u RBCs  CVP 18 PA 50/16 Thermo 6.6/3.2  Echo: EF 20-25%, normal RV, PASP 61, dilated IVC.   LVAD Interrogation HM 3: Speed: 5300 Flow: 4.7 PI: 2.5 Power: 4.0. VAD interrogated personally. Parameters stable.  Objective:   Weight Range: 99.6 kg Body mass index is 31.06 kg/m.   Vital Signs:   Temp:  [98.6 F (37 C)-99.5 F (37.5 C)] 98.6 F (37 C) (03/13 1100) Pulse Rate:  [29-121] 97 (03/13 1100) Resp:  [0-26] 18 (03/13 1100) BP: (86-158)/(65-139) 96/80 (03/13 1000) SpO2:  [84 %-98 %] 94 % (03/13 1045) Arterial Line BP: (66-99)/(50-80) 89/71 (03/13 1100) Weight:  [99.6 kg] 99.6 kg (03/13 0600) Last BM Date: 10/12/20  Weight change: Filed Weights   10/14/20 0500 10/15/20 0400 10/16/20 0600  Weight: 90.8 kg 100.6 kg 99.6 kg    Intake/Output:   Intake/Output Summary (Last 24 hours) at 10/16/2020 1115 Last data filed at 10/16/2020 1100 Gross per 24 hour  Intake 2977.82 ml  Output 2910 ml  Net 67.82 ml      Physical Exam   General:  Sitting up in bed  HEENT: normal  Neck: supple. RIJ swan   Carotids 2+ bilat; no bruits. No lymphadenopathy or thryomegaly appreciated. Cor: sternal wound ok LVAD hum.  Lungs: Clear. Abdomen:  obese soft, nontender, + distended. No hepatosplenomegaly. No bruits or masses. Hypoactive bowel sounds. Driveline site clean. Anchor in place.  Extremities: no cyanosis, clubbing, rash. Warm 2+  edema  Neuro: alert & oriented x 3. No focal deficits. Moves all 4 without problem    Telemetry   Atrial fibrillation 90s Personally reviewed  Labs    CBC Recent Labs    10/15/20 0410 10/16/20 0435  WBC 27.9* 29.4*  NEUTROABS 22.6* 24.4*  HGB 8.2* 7.3*  HCT 25.5* 22.7*  MCV 86.7 87.6  PLT 184 168   Basic Metabolic Panel Recent Labs    10/15/20 0410 10/16/20 0435  NA 138 138  K 4.5 3.9  CL 106 105  CO2 22 21*  GLUCOSE 122* 159*  BUN 29* 38*  CREATININE 2.08* 2.54*  CALCIUM 8.2* 8.0*  MG 2.4 2.4  PHOS 4.7* 4.6   Liver Function Tests Recent Labs    10/15/20 0410 10/16/20 0435  AST 188* 145*  ALT 38 51*  ALKPHOS 71 82  BILITOT 3.5* 3.1*  PROT 6.0* 6.2*  ALBUMIN 2.6* 2.4*   No results for input(s): LIPASE, AMYLASE in the last 72 hours. Cardiac Enzymes No results for input(s): CKTOTAL, CKMB, CKMBINDEX, TROPONINI in the last 72 hours.  BNP: BNP (last 3 results) Recent Labs  10/14/20 0254  BNP 1,097.8*    ProBNP (last 3 results) No results for input(s): PROBNP in the last 8760 hours.   D-Dimer Recent Labs    10/13/20 2112  DDIMER 0.88*   Hemoglobin A1C No results for input(s): HGBA1C in the last 72 hours. Fasting Lipid Panel No results for input(s): CHOL, HDL, LDLCALC, TRIG, CHOLHDL, LDLDIRECT in the last 72 hours. Thyroid Function Tests No results for input(s): TSH, T4TOTAL, T3FREE, THYROIDAB in the last 72 hours.  Invalid input(s): FREET3  Other results:   Imaging    DG Chest Port 1 View  Result Date: 10/16/2020 CLINICAL DATA:  Left ventricular assist device.  Chest tube present. EXAM: PORTABLE CHEST 1 VIEW COMPARISON:  October 15, 2020 FINDINGS: Chest tube unchanged in position on the right. Swan-Ganz catheter tip is in the right main  pulmonary artery. There is a left ventricular assist device. Pacemaker leads attached to the right atrium and right ventricle. No evident pneumothorax. Atelectatic change in the left lower lung region and medial right base are again noted. No new opacity evident. Stable cardiac prominence. Status post coronary artery bypass grafting. No adenopathy. No bone lesions. IMPRESSION: Tube and catheter positions as described without pneumothorax. Stable cardiac prominence. Stable left lower lung region and medial right base atelectasis. Postoperative changes noted. Electronically Signed   By: Lowella Grip III M.D.   On: 10/16/2020 08:03     Medications:     Scheduled Medications: . sodium chloride   Intravenous Once  . acetaminophen  1,000 mg Oral Q6H   Or  . acetaminophen (TYLENOL) oral liquid 160 mg/5 mL  1,000 mg Per Tube Q6H  . aspirin EC  325 mg Oral Daily   Or  . aspirin  324 mg Per Tube Daily   Or  . aspirin  300 mg Rectal Daily  . Chlorhexidine Gluconate Cloth  6 each Topical Daily  . docusate sodium  100 mg Oral Daily  . insulin aspart  0-15 Units Subcutaneous Q4H  . insulin detemir  20 Units Subcutaneous BID  . lidocaine  1 patch Transdermal Q24H  . mouth rinse  15 mL Mouth Rinse BID  . metoCLOPramide (REGLAN) injection  5 mg Intravenous Q12H  . metolazone  2.5 mg Oral Once  . midodrine  10 mg Oral TID WC  . multivitamin with minerals  1 tablet Oral Daily  . pantoprazole  40 mg Oral Daily  . sodium chloride flush  10-40 mL Intracatheter Q12H  . sodium chloride flush  3 mL Intravenous Q12H  . warfarin  1 mg Oral q1600  . Warfarin - Physician Dosing Inpatient   Does not apply q1600    Infusions: . sodium chloride Stopped (10/16/20 0925)  . sodium chloride    . sodium chloride 10 mL/hr at 10/14/20 1040  . amiodarone 30 mg/hr (10/16/20 1100)  . epinephrine 7 mcg/min (10/16/20 1100)  . furosemide (LASIX) 200 mg in dextrose 5% 100 mL (2mg /mL) infusion 12 mg/hr (10/16/20 1100)   . lactated ringers    . lactated ringers 20 mL/hr at 10/16/20 1100  . milrinone 0.25 mcg/kg/min (10/16/20 1100)  . norepinephrine (LEVOPHED) Adult infusion 12 mcg/min (10/16/20 1100)  . potassium chloride 10 mEq (10/16/20 1105)  . vasopressin 0.03 Units/min (10/16/20 1100)    PRN Medications: sodium chloride, dextrose, morphine injection, ondansetron (ZOFRAN) IV, oxyCODONE, sodium chloride flush, sodium chloride flush, traMADol    Assessment/Plan   1.Acute on chronic systolic HF -> cardiogenic shock:ICM.Has[employer] ICD.EF 15%. Low output persisted  despite milrinone and then dobutamine with rise in creatinine to 2.5. Impella 5.5 placed 3/1.  Creatinine improved to 1.7 and HM3 LVAD was placed on 3/10. Patient currently on milrinone 0.25, NE 12, epi 7, vaso 0.03 with CI 2.1, Co-ox 63 -> 53%. CVP 18,  - POD #3 - Remains tenuous and volume overloaded. Increase lasix gtt to 20. I increased VAD speed to 5500  2. LVAD - POD #3 - as above. VAD speed increased to 5500. Can look with echo tomorrow to further adjust as needed - ASA 325 until INR up to 2. INR 1.7 today - Will start anticoagulation when surgery ok's.  - DL site ok 3. CAD s/p CABG2002:Last cath in 6/19 with patent grafts.  - Continue Crestor 40 mg daily.  4. AKI on CKD Stage 3b: I suspect that this is a combination of cardiorenal syndrome and diabetic nephropathy and possibly ATN.  - creatinine trending back up post-op 2.1-> 2.5. Will continue hemodynamic support.  - Follow renal function closely. Hopefully will not need CVVHD  5. Atrial flutter/fibrillation: s/p DC-CV 08/21/18.He has been in rate-controlled atrial fibrillation.  - Continue amiodarone 200 mg bid.   - Will eventually resume anticoagulation.  6. Wound R Foot: Partial thickness skin loss noted. Cover with mepilex border and change every 5 days.  7. Right arm pain: Think neuropathic, per Dr. Cyndia Bent patient had stretch of brachial plexus with Impella  placement.  - Resolvedved with impella out  8. ID: WBC [id number] Afebrile, negative cultures. Follow closely  9. XT:GGYIRSW of very poor control but hgbA1ctrending down. Most recent 7.7%  - Cover with SSI.   CRITICAL CARE Performed by: Glori Bickers  Total critical care time: 40 minutes  Critical care time was exclusive of separately billable procedures and treating other patients.  Critical care was necessary to treat or prevent imminent or life-threatening deterioration.  Critical care was time spent personally by me on the following activities: development of treatment plan with patient and/or surrogate as well as nursing, discussions with consultants, evaluation of patient's response to treatment, examination of patient, obtaining history from patient or surrogate, ordering and performing treatments and interventions, ordering and review of laboratory studies, ordering and review of radiographic studies, pulse oximetry and re-evaluation of patient's condition.   Length of Stay: [place], [place]  10/16/2020, 11:15 AM  Advanced Heart Failure Team Pager [phone] (M-F; [place])  Please contact [facility] for night-coverage after hours (4p -7a ) and weekends on [email or web address]

## 2020-10-16 NOTE — Progress Notes (Signed)
I was asked by Trilby Drummer for a second assessment related to some neurological changes while pt was up to the chair. Upon my arrival, pt was alert, oriented and able to identify objects and perform simple tasks like identify the time on my watch. No focal neurological deficits. RUE is weak and has numbness and pain related to device access. RUE was negative for pronator drift. NIHSS 0.

## 2020-10-16 NOTE — Progress Notes (Signed)
PT Cancellation Note  Patient Details Name: Marcus Johnson. MRN: [id number] DOB: October 30, 1960   Cancelled Treatment:    Reason Eval/Treat Not Completed: Patient not medically ready (Hgb 7.3 with RN reporting pt to get blood as dangle last night and today did not go well and want to hold til after transfusion)   Khristian Seals B Durrel Mcnee 10/16/2020, 8:02 AM  Bayard Males, PT Acute Rehabilitation Services Pager: [phone] Office: [phone]

## 2020-10-16 NOTE — Progress Notes (Signed)
Patient ID: Marcus Johnson., male   DOB: Dec 12, 1960, 60 y.o.   MRN: [id number] HeartMate 3 Rounding Note  Subjective:    Hemodynamics stable overnight. PA 55/23, CVP 22, CI 3, Co-ox 53.3 this am but Hgb dropped to 7.3.  Milrinone 0.25 Epi 7 NE 12 Vaso  0.03  Dangled this am but reportedly falling to one side and keep repeating the same thing so rapid response called to assess and did not find any focal neuro changes. May have been due to generalized weakness.  LVAD INTERROGATION:  HeartMate IIl LVAD:  Flow 4.8 liters/min, speed 5300, power 3.8, PI 2.4.  Few PI events yesterday but none overnight.   Objective:    Vital Signs:   Temp:  [98.96 F (37.2 C)-99.5 F (37.5 C)] 99 F (37.2 C) (03/13 0820) Pulse Rate:  [29-121] 97 (03/13 0820) Resp:  [0-26] 17 (03/13 0820) BP: (152-158)/(116-139) 152/116 (03/12 1700) SpO2:  [84 %-98 %] 96 % (03/13 0820) Arterial Line BP: (66-87)/(50-71) 84/70 (03/13 0820) Weight:  [99.6 kg] 99.6 kg (03/13 0600) Last BM Date: 10/12/20 Mean arterial Pressure 70's  Intake/Output:   Intake/Output Summary (Last 24 hours) at 10/16/2020 5093 Last data filed at 10/16/2020 0630 Gross per 24 hour  Intake 2843.32 ml  Output 2902 ml  Net -58.68 ml     Physical Exam: General:  Looks tired HEENT: normal Cor: Mechanical heart sounds with LVAD hum present. Lungs: clear Abdomen: soft, nontender, nondistended. Few bowel sounds. Extremities: anasarca Neuro: alert & orientedx3, cranial nerves grossly intact. moves all 4 extremities w/o difficulty. Affect pleasant  Telemetry: sinus 90's with frequent premature beats.  Labs: Basic Metabolic Panel: Recent Labs  Lab 10/13/20 2112 10/13/20 2117 10/14/20 0254 10/14/20 0258 10/14/20 1549 10/14/20 1655 10/14/20 1701 10/15/20 0410 10/16/20 0435  NA 136   < > 136   < > 139 138 139 138 138  K 4.1   < > 4.7   < > 4.8 4.7 4.7 4.5 3.9  CL 107  --  106  --   --  106  --  106 105  CO2 22  --  22  --    --  23  --  22 21*  GLUCOSE 133*  --  146*  --   --  112*  --  122* 159*  BUN 25*  --  26*  --   --  28*  --  29* 38*  CREATININE 1.75*  --  1.84*  --   --  1.90*  --  2.08* 2.54*  CALCIUM 8.6*  --  8.5*  --   --  8.2*  --  8.2* 8.0*  MG 2.6*  --  2.4  --   --  2.5*  --  2.4 2.4  PHOS  --   --  3.6  --   --   --   --  4.7* 4.6   < > = values in this interval not displayed.    Liver Function Tests: Recent Labs  Lab 10/12/20 1826 10/14/20 0254 10/15/20 0410 10/16/20 0435  AST 31 138* 188* 145*  ALT 17 29 38 51*  ALKPHOS 136* 56 71 82  BILITOT 1.3* 3.5* 3.5* 3.1*  PROT 6.7 5.5* 6.0* 6.2*  ALBUMIN 2.5* 3.1* 2.6* 2.4*   No results for input(s): LIPASE, AMYLASE in the last 168 hours. No results for input(s): AMMONIA in the last 168 hours.  CBC: Recent Labs  Lab 10/14/20 0254 10/14/20 0258 10/14/20 0737 10/14/20 1549  10/14/20 1655 10/14/20 1701 10/15/20 0410 10/16/20 0435  WBC 19.7*  --  19.8*  --  24.3*  --  27.9* 29.4*  NEUTROABS 14.7*  --   --   --   --   --  22.6* 24.4*  HGB 8.2*   < > 9.2* 9.2* 8.9* 9.2* 8.2* 7.3*  HCT 25.0*   < > 26.7* 27.0* 26.9* 27.0* 25.5* 22.7*  MCV 84.5  --  82.7  --  84.9  --  86.7 87.6  PLT 119*  --  131*  --  165  --  184 232   < > = values in this interval not displayed.    INR: Recent Labs  Lab 10/13/20 1651 10/13/20 2112 10/14/20 0254 10/15/20 0410 10/16/20 0435  INR 1.6* 1.5* 1.4* 1.6* 1.7*    Other results:  EKG:   Imaging: DG Chest Port 1 View  Result Date: 10/16/2020 CLINICAL DATA:  Left ventricular assist device.  Chest tube present. EXAM: PORTABLE CHEST 1 VIEW COMPARISON:  October 15, 2020 FINDINGS: Chest tube unchanged in position on the right. Swan-Ganz catheter tip is in the right main pulmonary artery. There is a left ventricular assist device. Pacemaker leads attached to the right atrium and right ventricle. No evident pneumothorax. Atelectatic change in the left lower lung region and medial right base are again  noted. No new opacity evident. Stable cardiac prominence. Status post coronary artery bypass grafting. No adenopathy. No bone lesions. IMPRESSION: Tube and catheter positions as described without pneumothorax. Stable cardiac prominence. Stable left lower lung region and medial right base atelectasis. Postoperative changes noted. Electronically Signed   By: Lowella Grip III M.D.   On: 10/16/2020 08:03   DG CHEST PORT 1 VIEW  Result Date: 10/15/2020 CLINICAL DATA:  Status post extubation. Left ventricular assist device present. EXAM: PORTABLE CHEST 1 VIEW COMPARISON:  October 14, 2020 FINDINGS: Endotracheal tube and nasogastric tube have been removed. Swan-Ganz catheter tip is in the right main pulmonary artery. Pacemaker leads are attached to the right atrium and right ventricle. Left ventricular assist device present. Chest tube present on the right. No pneumothorax. There is atelectatic change in the left lower lung region, stable. There is also mild atelectasis in the medial right base. Lungs otherwise are clear. There is cardiomegaly, stable. Pulmonary vascularity is normal. No adenopathy. Status post coronary artery bypass grafting. No bone lesions. IMPRESSION: Postoperative changes. Tube and catheter positions as described without pneumothorax. Persistent left lower lobe atelectasis. There is mild medial right base atelectasis currently. Stable cardiomegaly. Electronically Signed   By: Lowella Grip III M.D.   On: 10/15/2020 08:53      Medications:     Scheduled Medications: . sodium chloride   Intravenous Once  . acetaminophen  1,000 mg Oral Q6H   Or  . acetaminophen (TYLENOL) oral liquid 160 mg/5 mL  1,000 mg Per Tube Q6H  . aspirin EC  325 mg Oral Daily   Or  . aspirin  324 mg Per Tube Daily   Or  . aspirin  300 mg Rectal Daily  . Chlorhexidine Gluconate Cloth  6 each Topical Daily  . docusate sodium  100 mg Oral Daily  . insulin aspart  0-15 Units Subcutaneous Q4H  . insulin  detemir  20 Units Subcutaneous BID  . lidocaine  1 patch Transdermal Q24H  . mouth rinse  15 mL Mouth Rinse BID  . metoCLOPramide (REGLAN) injection  5 mg Intravenous Q12H  . metolazone  2.5 mg  Oral Once  . midodrine  10 mg Oral TID WC  . multivitamin with minerals  1 tablet Oral Daily  . pantoprazole  40 mg Oral Daily  . sodium chloride flush  10-40 mL Intracatheter Q12H  . sodium chloride flush  3 mL Intravenous Q12H  . warfarin  1 mg Oral q1600  . Warfarin - Physician Dosing Inpatient   Does not apply q1600     Infusions: . sodium chloride Stopped (10/16/20 0019)  . sodium chloride    . sodium chloride 10 mL/hr at 10/14/20 1040  . amiodarone 30 mg/hr (10/16/20 0600)  . epinephrine 7 mcg/min (10/16/20 0600)  . furosemide (LASIX) 200 mg in dextrose 5% 100 mL (2mg /mL) infusion 12 mg/hr (10/16/20 0600)  . lactated ringers    . lactated ringers 20 mL/hr at 10/16/20 0600  . milrinone 0.25 mcg/kg/min (10/16/20 0813)  . norepinephrine (LEVOPHED) Adult infusion 12 mcg/min (10/16/20 0600)  . vasopressin 0.03 Units/min (10/16/20 0600)     PRN Medications:  sodium chloride, dextrose, morphine injection, ondansetron (ZOFRAN) IV, oxyCODONE, sodium chloride flush, sodium chloride flush, traMADol   Assessment:    1.Acute on chronic systolic congestive heart failure due to ischemic cardiomyopathy with ejection fraction of 20 to 25% admitted with class IIIb symptoms.POD 3 HM3 LVAD.  2. CAD s/p CABG 2002 with patent grafts on last cath.  3. Stage 3 CKD due to diabetic nephropathy and cardiorenal syndrome. Baseline recently 1.8. creat up to 2.54 today which is not unexpected with pump run, diuresis. Continue to maintain adequate MAP and expect that to improve with time.  4. Atrial fib/flutter: maintaining sinus most of the time on amio IV. Required two boluses yesterday for episodes of AF with rate 120's.  5. Right brachial plexus stretch from preop Impella and hematoma around  the axillary artery. This should improve with time.  6. Expected acute postop blood loss anemia. His Hgb dropped from 8.2 to 7.3 over past 24 hrs but likely due to blood draws, dilution. Transfusing one unit this am.  7. Nurse reports a couple blisters on buttocks probably due to edema and pressure. Turn frequently and observe.   8. DM: glucose under good control on Levemir and SSI.  Plan/Discussion:    He has been hemodynamically stable but CVP is up. Continue lasix drip and could try more metolazone. Likely has acute kidney injury due to pump run which will take time to recover if it does. Maintain adequate MAP. Continue present inotropes/pressors. Vaso may keep urine output limited.  Will keep pleural tubes and pocket drain in for now to prevent effusions.  Slight bump in INR after 2.5 Coumadin yesterday. Will only give 1 mg today. I don't want INR to jump up.  Length of Stay: [address]  Fernande Boyden [facility] 10/16/2020, 8:21 AM

## 2020-10-17 ENCOUNTER — Inpatient Hospital Stay: Payer: Self-pay

## 2020-10-17 ENCOUNTER — Inpatient Hospital Stay (HOSPITAL_COMMUNITY): Payer: Medicare HMO

## 2020-10-17 DIAGNOSIS — I5023 Acute on chronic systolic (congestive) heart failure: Secondary | ICD-10-CM | POA: Diagnosis not present

## 2020-10-17 DIAGNOSIS — Z95811 Presence of heart assist device: Secondary | ICD-10-CM | POA: Diagnosis not present

## 2020-10-17 LAB — CBC WITH DIFFERENTIAL/PLATELET
Abs Immature Granulocytes: 0.67 10*3/uL — ABNORMAL HIGH (ref 0.00–0.07)
Basophils Absolute: 0.1 10*3/uL (ref 0.0–0.1)
Basophils Relative: 0 %
Eosinophils Absolute: 0 10*3/uL (ref 0.0–0.5)
Eosinophils Relative: 0 %
HCT: 23.8 % — ABNORMAL LOW (ref 39.0–52.0)
Hemoglobin: 8 g/dL — ABNORMAL LOW (ref 13.0–17.0)
Immature Granulocytes: 3 %
Lymphocytes Relative: 6 %
Lymphs Abs: 1.5 10*3/uL (ref 0.7–4.0)
MCH: 29.3 pg (ref 26.0–34.0)
MCHC: 33.6 g/dL (ref 30.0–36.0)
MCV: 87.2 fL (ref 80.0–100.0)
Monocytes Absolute: 2.3 10*3/uL — ABNORMAL HIGH (ref 0.1–1.0)
Monocytes Relative: 8 %
Neutro Abs: 22.6 10*3/uL — ABNORMAL HIGH (ref 1.7–7.7)
Neutrophils Relative %: 83 %
Platelets: 262 10*3/uL (ref 150–400)
RBC: 2.73 MIL/uL — ABNORMAL LOW (ref 4.22–5.81)
RDW: 17.7 % — ABNORMAL HIGH (ref 11.5–15.5)
WBC: 27.2 10*3/uL — ABNORMAL HIGH (ref 4.0–10.5)
nRBC: 1.5 % — ABNORMAL HIGH (ref 0.0–0.2)

## 2020-10-17 LAB — POCT I-STAT 7, (LYTES, BLD GAS, ICA,H+H)
Acid-base deficit: 2 mmol/L (ref 0.0–2.0)
Bicarbonate: 22.9 mmol/L (ref 20.0–28.0)
Calcium, Ion: 1.14 mmol/L — ABNORMAL LOW (ref 1.15–1.40)
HCT: 25 % — ABNORMAL LOW (ref 39.0–52.0)
Hemoglobin: 8.5 g/dL — ABNORMAL LOW (ref 13.0–17.0)
O2 Saturation: 95 %
Patient temperature: 37
Potassium: 3.8 mmol/L (ref 3.5–5.1)
Sodium: 140 mmol/L (ref 135–145)
TCO2: 24 mmol/L (ref 22–32)
pCO2 arterial: 38.9 mmHg (ref 32.0–48.0)
pH, Arterial: 7.377 (ref 7.350–7.450)
pO2, Arterial: 78 mmHg — ABNORMAL LOW (ref 83.0–108.0)

## 2020-10-17 LAB — BASIC METABOLIC PANEL
Anion gap: 12 (ref 5–15)
BUN: 45 mg/dL — ABNORMAL HIGH (ref 6–20)
CO2: 21 mmol/L — ABNORMAL LOW (ref 22–32)
Calcium: 8.1 mg/dL — ABNORMAL LOW (ref 8.9–10.3)
Chloride: 105 mmol/L (ref 98–111)
Creatinine, Ser: 3 mg/dL — ABNORMAL HIGH (ref 0.61–1.24)
GFR, Estimated: 23 mL/min — ABNORMAL LOW (ref >60)
Glucose, Bld: 75 mg/dL (ref 70–99)
Potassium: 3.8 mmol/L (ref 3.5–5.1)
Sodium: 138 mmol/L (ref 135–145)

## 2020-10-17 LAB — SODIUM, URINE, RANDOM: Sodium, Ur: 75 mmol/L

## 2020-10-17 LAB — MAGNESIUM: Magnesium: 2.6 mg/dL — ABNORMAL HIGH (ref 1.7–2.4)

## 2020-10-17 LAB — GLUCOSE, CAPILLARY
Glucose-Capillary: 58 mg/dL — ABNORMAL LOW (ref 70–99)
Glucose-Capillary: 61 mg/dL — ABNORMAL LOW (ref 70–99)
Glucose-Capillary: 69 mg/dL — ABNORMAL LOW (ref 70–99)
Glucose-Capillary: 136 mg/dL — ABNORMAL HIGH (ref 70–99)
Glucose-Capillary: 98 mg/dL (ref 70–99)
Glucose-Capillary: 81 mg/dL (ref 70–99)
Glucose-Capillary: 123 mg/dL — ABNORMAL HIGH (ref 70–99)
Glucose-Capillary: 155 mg/dL — ABNORMAL HIGH (ref 70–99)

## 2020-10-17 LAB — COOXEMETRY PANEL
Carboxyhemoglobin: 1.6 % — ABNORMAL HIGH (ref 0.5–1.5)
Methemoglobin: 1 % (ref 0.0–1.5)
O2 Saturation: 62 %
Total hemoglobin: 8.2 g/dL — ABNORMAL LOW (ref 12.0–16.0)

## 2020-10-17 LAB — CREATININE, URINE, RANDOM: Creatinine, Urine: 28.59 mg/dL

## 2020-10-17 LAB — PROTIME-INR
INR: 1.7 — ABNORMAL HIGH (ref 0.8–1.2)
Prothrombin Time: 19.7 seconds — ABNORMAL HIGH (ref 11.4–15.2)

## 2020-10-17 LAB — PHOSPHORUS: Phosphorus: 4.7 mg/dL — ABNORMAL HIGH (ref 2.5–4.6)

## 2020-10-17 LAB — ECHOCARDIOGRAM LIMITED
Height: 70.5 in
Weight: 3403.9 oz

## 2020-10-17 LAB — LACTATE DEHYDROGENASE: LDH: 670 U/L — ABNORMAL HIGH (ref 98–192)

## 2020-10-17 MED ORDER — VITAL 1.5 CAL PO LIQD
1000.0000 mL | ORAL | Status: DC
  Administered 2020-10-17 – 2020-10-20 (×2): 1000 mL

## 2020-10-17 MED ORDER — POTASSIUM CHLORIDE 20 MEQ PO PACK
20.0000 meq | PACK | Freq: Once | ORAL | Status: AC
  Administered 2020-10-17: 20 meq via ORAL

## 2020-10-17 MED ORDER — INSULIN DETEMIR 100 UNIT/ML ~~LOC~~ SOLN
15.0000 [IU] | Freq: Two times a day (BID) | SUBCUTANEOUS | Status: DC
  Administered 2020-10-17 – 2020-10-21 (×9): 15 [IU] via SUBCUTANEOUS
  Filled 2020-10-17 (×12): qty 0.15

## 2020-10-17 MED ORDER — WARFARIN SODIUM 2 MG PO TABS
2.0000 mg | ORAL_TABLET | Freq: Every day | ORAL | Status: DC
  Administered 2020-10-17 – 2020-10-18 (×2): 2 mg via ORAL
  Filled 2020-10-17 (×2): qty 1

## 2020-10-17 MED ORDER — PROSOURCE TF PO LIQD
45.0000 mL | Freq: Three times a day (TID) | ORAL | Status: DC
  Administered 2020-10-17 – 2020-10-21 (×12): 45 mL
  Filled 2020-10-17 (×12): qty 45

## 2020-10-17 NOTE — Evaluation (Signed)
Occupational Therapy Evaluation Patient Details Name: Marcus Johnson. MRN: [id number] DOB: 1961/03/17 Today's Date: 10/17/2020    History of Present Illness 60 y.o. male admitted 2/28 with ICM for LVAD workup. Impella placed 3/1. Heartmate 3 LVAD placed 3/10. PMHx: DM2, CAD s/p CABG 0174, systolic HF due to ischemic cardiomyopathy with EF 20-25% (echo 12/15), DM2 and CKD. He is s/p [employer] single chamber ICD. Covid 1/22   Clinical Impression   This 60 yo male admitted with above presents to acute OT increased edema Bil UEs (R>L), decreased activity tolerance, c/o dizziness upon first getting up to EOB, decreased sitting and standing balance, decreased coordination for side stepping to left, decreased cognition all affecting his safety and independence with basic ADLs. Pt independent pta. He will continue to benefit from acute OT with follow up on CIR to get to a S-Mod I level before returning home. See Pt note for vitals.    Follow Up Recommendations  CIR;Supervision/Assistance - 24 hour    Equipment Recommendations  Other (comment) (TBD next venue)    Recommendations for Other Services Rehab consult     Precautions / Restrictions Precautions Precautions: Fall;Sternal Precaution Booklet Issued: Yes (comment) Precaution Comments: swan-ganz, LVAD, multiple lines      Mobility Bed Mobility Overal bed mobility: Needs Assistance Bed Mobility: Rolling;Sidelying to Sit;Sit to Supine Rolling: Mod assist Sidelying to sit: Mod assist Supine to sit: Total assist;+2 for physical assistance     General bed mobility comments: mod assist to roll to left and bring legs off bed then clear trunk. Return to bed with total +2 assist for safety    Transfers Overall transfer level: Needs assistance   Transfers: Sit to/from Stand Sit to Stand: Max assist;+2 physical assistance         General transfer comment: max +2 assist with help of pad cradling sacrum with cues for hands on  thighs to stand x 2 trials from EOB. Pt able to side step with assist for weight shifting and verbal cueing. RLE bouncing in standing without full buckling    Balance Overall balance assessment: Needs assistance   Sitting balance-Leahy Scale: Fair Sitting balance - Comments: EOB with min- minguard assist, increased assist with fatigue with tendency for posterior bias   Standing balance support: Bilateral upper extremity supported Standing balance-Leahy Scale: Poor Standing balance comment: bil UE supported at axilla and knees blocked                           ADL either performed or assessed with clinical judgement   ADL                                         General ADL Comments: Pt currently total A due to edema in Bil UEs and all the lines/tubes     Vision Baseline Vision/History: Wears glasses Wears Glasses: Reading only Patient Visual Report: No change from baseline              Pertinent Vitals/Pain Pain Assessment: 0-10 Pain Score: 4  Pain Location: RUE Pain Descriptors / Indicators: Aching;Guarding;Sore Pain Intervention(s): Limited activity within patient's tolerance;Monitored during session;Repositioned     Hand Dominance  right   Extremity/Trunk Assessment Upper Extremity Assessment Upper Extremity Assessment: RUE deficits/detail;LUE deficits/detail RUE Deficits / Details: increased edema with really stiff 2nd/3rd digits, cannot close hand fully (2/3);  also edema throughout rest of arm RUE Coordination: decreased fine motor;decreased gross motor LUE Deficits / Details: increased edema can fully close hand but not tight--can hold ice pop and work on squeezing it. LUE Coordination: decreased fine motor;decreased gross motor   Lower Extremity Assessment Lower Extremity Assessment: Generalized weakness   Cervical / Trunk Assessment Cervical / Trunk Assessment: Normal   Communication Communication Communication: No  difficulties   Cognition Arousal/Alertness: Awake/alert Behavior During Therapy: Flat affect Overall Cognitive Status: Impaired/Different from baseline Area of Impairment: Memory;Following commands;Problem solving                     Memory: Decreased short-term memory;Decreased recall of precautions Following Commands: Follows one step commands consistently;Follows one step commands with increased time     Problem Solving: Slow processing General Comments: pt with difficulty recalling all sternal precautions and LVAD equipment, reportedly sleepy and taking in a lot of information today      Exercises General Exercises - Lower Extremity Short Arc Quad: AROM;Both;Seated;10 reps        Home Living Family/patient expects to be discharged to:: Private residence Living Arrangements: Other relatives;Children Available Help at Discharge: Family;Available 24 hours/day Type of Home: Apartment Home Access: Stairs to enter [employer] of Steps: 3 flights Entrance Stairs-Rails: Right;Left Home Layout: One level     Bathroom Shower/Tub: Teacher, early years/pre: Standard     Home Equipment: None          Prior Functioning/Environment Level of Independence: Independent                 OT Problem List: Decreased strength;Decreased range of motion;Decreased activity tolerance;Impaired balance (sitting and/or standing);Decreased coordination;Decreased safety awareness;Decreased cognition;Decreased knowledge of use of DME or AE;Impaired UE functional use;Increased edema;Pain      OT Treatment/Interventions: Self-care/ADL training;Patient/family education;DME and/or AE instruction;Therapeutic activities;Therapeutic exercise;Balance training;Cognitive remediation/compensation    OT Goals(Current goals can be found in the care plan section) Acute Rehab OT Goals Patient Stated Goal: to return to independent mobility OT Goal Formulation: With  patient Time For Goal Achievement: 10/31/20 Potential to Achieve Goals: Good  OT Frequency: Min 2X/week           Co-evaluation PT/OT/SLP Co-Evaluation/Treatment: Yes Reason for Co-Treatment: Complexity of the patient's impairments (multi-system involvement);For patient/therapist safety PT goals addressed during session: Mobility/safety with mobility;Strengthening/ROM OT goals addressed during session: Strengthening/ROM      AM-PAC OT "6 Clicks" Daily Activity     Outcome Measure Help from another person eating meals?: Total Help from another person taking care of personal grooming?: Total Help from another person toileting, which includes using toliet, bedpan, or urinal?: Total Help from another person bathing (including washing, rinsing, drying)?: Total Help from another person to put on and taking off regular upper body clothing?: Total Help from another person to put on and taking off regular lower body clothing?: Total 6 Click Score: 6   End of Session Nurse Communication:  (how pt did with [place])  Activity Tolerance: Patient limited by fatigue Patient left: in bed;with call bell/phone within reach;with bed alarm set (pillow under right side)  OT Visit Diagnosis: Unsteadiness on feet (R26.81);Other abnormalities of gait and mobility (R26.89);Muscle weakness (generalized) (M62.81);Pain;Other symptoms and signs involving cognitive function;Dizziness and giddiness (R42) Pain - Right/Left: Right Pain - part of body: Arm                Time: 0263-7858 OT Time Calculation (min): 27 min Charges:  OT  General Charges $OT Visit: 1 Visit OT Evaluation $OT Eval Moderate Complexity: 1 Mod    Almon Register 10/17/2020, 12:07 PM
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
## 2020-10-17 NOTE — Procedures (Signed)
Cortrak  Person Inserting Tube:  Esaw Dace, RD Tube Type:  Cortrak - 43 inches Tube Location:  Right nare Initial Placement:  Stomach Secured by: Bridle Technique Used to Measure Tube Placement:  Documented cm marking at nare/ corner of mouth Cortrak Secured At:  75 cm Procedure Comments:  Cortrak Tube Team Note:  Consult received to place a Cortrak feeding tube.   X-ray is required-pt with LVAD, abdominal x-ray has been ordered by the Cortrak team. Please confirm tube placement before using the Cortrak tube.   If the tube becomes dislodged please keep the tube and contact the Cortrak team at [email or web address] (password [email or web address]) for replacement.  If after hours and replacement cannot be delayed, place a NG tube and confirm placement with an abdominal x-ray.    Kerman Passey MS, RDN, LDN, CNSC Registered Dietitian III Clinical Nutrition RD Pager and On-Call Pager Number Located in [place]

## 2020-10-17 NOTE — Evaluation (Signed)
Physical Therapy re-Evaluation Patient Details Name: Marcus Johnson. MRN: [id number] DOB: 12/24/1960 Today's Date: 10/17/2020   History of Present Illness  60 y.o. male admitted 2/28 with ICM for LVAD workup. Impella placed 3/1. Heartmate 3 LVAD placed 3/10. PMHx: DM2, CAD s/p CABG 6962, systolic HF due to ischemic cardiomyopathy with EF 20-25% (echo 12/15), DM2 and CKD. He is s/p Medtronic single chamber ICD. Covid 1/22  Clinical Impression  Pt with decreased cognition, strength, balance and transfers compared to prior sessions who will benefit from acute therapy to maximize mobility, safety, LVAD management, and independence to decrease burden of care. Will continue to follow with pt educated for sternal precautions and initial LVAD parts education as well as basic transfers. Pt limited by fatigue and lightheadedness in standing. Pt positioned with pillow under left hip.  Sitting 83/67 via art line Standing 74/59 (67) Supine 87/67 (80) HR 107-112 95% on 2L    Follow Up Recommendations Supervision/Assistance - 24 hour    Equipment Recommendations  Other (comment) (TBD with progression)    Recommendations for Other Services OT consult;Rehab consult     Precautions / Restrictions Precautions Precautions: Fall;Sternal Precaution Booklet Issued: Yes (comment) Precaution Comments: swan-ganz, LVAD, multiple lines      Mobility  Bed Mobility Overal bed mobility: Needs Assistance Bed Mobility: Rolling;Sidelying to Sit;Sit to Supine Rolling: Mod assist Sidelying to sit: Mod assist Supine to sit: Total assist;+2 for physical assistance     General bed mobility comments: mod assist to roll to left and bring legs off bed then clear trunk. Return to bed with total +2 assist for safety    Transfers Overall transfer level: Needs assistance   Transfers: Sit to/from Stand Sit to Stand: Max assist;+2 physical assistance         General transfer comment: max +2 assist with  help of pad cradling sacrum with cues for hands on thighs to stand x 2 trials from EOB. Pt able to side step with assist for weight shifting and verbal cueing. RLE bouncing in standing without full buckling  Ambulation/Gait             General Gait Details: not yet able  Stairs            Wheelchair Mobility    Modified Rankin (Stroke Patients Only)       Balance Overall balance assessment: Needs assistance   Sitting balance-Leahy Scale: Fair Sitting balance - Comments: EOB with min- minguard assist, increased assist with fatigue with tendency for posterior bias   Standing balance support: Bilateral upper extremity supported Standing balance-Leahy Scale: Poor Standing balance comment: bil UE supported at axilla and knees blocked                             Pertinent Vitals/Pain Pain Assessment: 0-10 Pain Score: 4  Pain Location: RUE Pain Descriptors / Indicators: Aching;Guarding;Sore Pain Intervention(s): Limited activity within patient's tolerance;Monitored during session;Repositioned    Home Living Family/patient expects to be discharged to:: Private residence Living Arrangements: Other relatives;Children Available Help at Discharge: Family;Available 24 hours/day Type of Home: Apartment Home Access: Stairs to enter Entrance Stairs-Rails: Right;Left Entrance Stairs-Number of Steps: 3 flights Home Layout: One level Home Equipment: None      Prior Function Level of Independence: Independent               Hand Dominance        Extremity/Trunk Assessment   Upper Extremity  Assessment Upper Extremity Assessment: Defer to OT evaluation    Lower Extremity Assessment Lower Extremity Assessment: Generalized weakness    Cervical / Trunk Assessment Cervical / Trunk Assessment: Normal  Communication   Communication: No difficulties  Cognition Arousal/Alertness: Awake/alert Behavior During Therapy: Flat affect Overall Cognitive  Status: Impaired/Different from baseline Area of Impairment: Memory;Following commands;Problem solving                     Memory: Decreased short-term memory;Decreased recall of precautions Following Commands: Follows one step commands consistently;Follows one step commands with increased time     Problem Solving: Slow processing General Comments: pt with difficulty recalling all sternal precautions and LVAD equipment, reportedly sleepy and taking in a lot of information today      General Comments      Exercises General Exercises - Lower Extremity Short Arc Quad: AROM;Both;Seated;10 reps   Assessment/Plan    PT Assessment Patient needs continued PT services  PT Problem List Decreased strength;Decreased activity tolerance;Decreased balance;Decreased mobility;Decreased knowledge of use of DME;Decreased knowledge of precautions;Cardiopulmonary status limiting activity;Decreased safety awareness       PT Treatment Interventions DME instruction;Gait training;Stair training;Functional mobility training;Therapeutic activities;Therapeutic exercise;Balance training;Patient/family education;Neuromuscular re-education;Cognitive remediation    PT Goals (Current goals can be found in the Care Plan section)  Acute Rehab PT Goals Patient Stated Goal: to return to independent mobility PT Goal Formulation: With patient Time For Goal Achievement: 10/31/20 Potential to Achieve Goals: Fair    Frequency Min 4X/week   Barriers to discharge Decreased caregiver support      Co-evaluation   Reason for Co-Treatment: Complexity of the patient's impairments (multi-system involvement);For patient/therapist safety PT goals addressed during session: Mobility/safety with mobility;Strengthening/ROM OT goals addressed during session: Strengthening/ROM       AM-PAC PT "6 Clicks" Mobility  Outcome Measure Help needed turning from your back to your side while in a flat bed without using  bedrails?: A Lot Help needed moving from lying on your back to sitting on the side of a flat bed without using bedrails?: A Lot Help needed moving to and from a bed to a chair (including a wheelchair)?: Total Help needed standing up from a chair using your arms (e.g., wheelchair or bedside chair)?: Total Help needed to walk in hospital room?: Total Help needed climbing 3-5 steps with a railing? : Total 6 Click Score: 8    End of Session   Activity Tolerance: Patient tolerated treatment well Patient left: in bed;with call bell/phone within reach;with bed alarm set Nurse Communication: Mobility status PT Visit Diagnosis: Unsteadiness on feet (R26.81);Other abnormalities of gait and mobility (R26.89);Muscle weakness (generalized) (M62.81);Difficulty in walking, not elsewhere classified (R26.2)    Time: 7482-7078 PT Time Calculation (min) (ACUTE ONLY): 26 min   Charges:   PT Evaluation $PT Re-evaluation: 1 Re-eval          Bayard Males, PT Acute Rehabilitation Services Pager: [phone] Office: [phone]   Soma Bachand B Tayvion Lauder 10/17/2020, 12:00 PM

## 2020-10-17 NOTE — Consult Note (Signed)
[facility] Nurse Consult Note: Patient receiving care in [facility] 2H17. Reason for Consult: wound Wound type: there is an area at the apex of the intergluteal fold that is highly likely related to moisture - ITD; friction, pressure. At this time it is difficult to determine if this is more related to MASD-ITD or an evolving DTPI. The overlying tissue appears intact, but loose. Pressure Injury POA: No Measurement: Wound bed: slightly darker than surrounding tissue Drainage (amount, consistency, odor) none on the existing foam dressing Periwound: intact Dressing procedure/placement/frequency: Place a small piece of Aquacel Kellie Simmering [phone]) over the area at the intergluteal apex, then a foam dressing. Change the Aquacel each day. The foam dressing can remain in place up to 3 days. Monitor the wound area(s) for worsening of condition such as: Signs/symptoms of infection,  Increase in size,  Development of or worsening of odor, Development of pain, or increased pain at the affected locations.  Notify the medical team if any of these develop.  Thank you for the consult.  Discussed plan of care with the patient and bedside nurse.  [facility] [facility] nurse will not follow at this time.  Please re-consult the [facility] team if needed.  Val Riles, RN, MSN, CWOCN, CNS-BC, pager [phone]

## 2020-10-17 NOTE — Progress Notes (Signed)
PT Cancellation Note  Patient Details Name: Marcus Johnson. MRN: [id number] DOB: 01-Oct-1960   Cancelled Treatment:    Reason Eval/Treat Not Completed: Active bedrest order   [name] 10/17/2020, 7:51 AM  Bayard Males, PT Acute Rehabilitation Services Pager: [phone] Office: [phone]

## 2020-10-17 NOTE — Progress Notes (Signed)
  Echocardiogram 2D Echocardiogram has been performed.  Fidel Levy 10/17/2020, 10:12 AM

## 2020-10-17 NOTE — Progress Notes (Addendum)
Patient ID: Marcus Rodelo., male   DOB: Oct 08, 1960, 60 y.o.   MRN: [id number] HeartMate 3 Rounding Note  Subjective:    Hemodynamics stable overnight. PA 63/31, CVP 24, CI 2.8, Co-ox 62%  Milrinone 0.25 Epi 6 NE 10 Vaso 0.02  Rhythm is sinus alternating with AF 90-110  I/O +980 yesterday Standing wt today is 212.74, preop was 200.18.  LVAD INTERROGATION:  HeartMate IIl LVAD:  Flow 5.2 liters/min, speed 5500, power 4.1, PI 2.0.    Objective:    Vital Signs:   Temp:  [98.42 F (36.9 C)-99 F (37.2 C)] 98.42 F (36.9 C) (03/14 0700) Pulse Rate:  [41-120] 111 (03/14 0700) Resp:  [8-27] 21 (03/14 0700) BP: (87-102)/(63-80) 102/75 (03/13 2000) SpO2:  [90 %-100 %] 93 % (03/14 0700) Arterial Line BP: (83-102)/(60-86) 83/66 (03/14 0700) Weight:  [96.5 kg] 96.5 kg (03/14 0500) Last BM Date: 10/12/20 Mean arterial Pressure 85  Intake/Output:   Intake/Output Summary (Last 24 hours) at 10/17/2020 0849 Last data filed at 10/17/2020 0800 Gross per 24 hour  Intake 2572.9 ml  Output 1850 ml  Net 722.9 ml     Physical Exam: General:  fatigued No resp difficulty HEENT: normal, mouth looks ok Neck: right neck sleeve Cor: Distant heart sounds with LVAD hum present. Lungs: clear Abdomen: soft, nontender, nondistended. Good bowel sounds. Extremities: moderate anasarca Neuro: alert & orientedx3, cranial nerves grossly intact. moves all 4 extremities w/o difficulty. Affect pleasant but flat.  Telemetry: sinus alt with AF  Labs: Basic Metabolic Panel: Recent Labs  Lab 10/14/20 0254 10/14/20 0258 10/14/20 1655 10/14/20 1701 10/15/20 0410 10/16/20 0435 10/17/20 0346 10/17/20 0348  NA 136   < > 138 139 138 138 138 140  K 4.7   < > 4.7 4.7 4.5 3.9 3.8 3.8  CL 106  --  106  --  106 105 105  --   CO2 22  --  23  --  22 21* 21*  --   GLUCOSE 146*  --  112*  --  122* 159* 75  --   BUN 26*  --  28*  --  29* 38* 45*  --   CREATININE 1.84*  --  1.90*  --  2.08* 2.54*  3.00*  --   CALCIUM 8.5*  --  8.2*  --  8.2* 8.0* 8.1*  --   MG 2.4  --  2.5*  --  2.4 2.4 2.6*  --   PHOS 3.6  --   --   --  4.7* 4.6 4.7*  --    < > = values in this interval not displayed.    Liver Function Tests: Recent Labs  Lab 10/12/20 1826 10/14/20 0254 10/15/20 0410 10/16/20 0435  AST 31 138* 188* 145*  ALT 17 29 38 51*  ALKPHOS 136* 56 71 82  BILITOT 1.3* 3.5* 3.5* 3.1*  PROT 6.7 5.5* 6.0* 6.2*  ALBUMIN 2.5* 3.1* 2.6* 2.4*   No results for input(s): LIPASE, AMYLASE in the last 168 hours. No results for input(s): AMMONIA in the last 168 hours.  CBC: Recent Labs  Lab 10/14/20 0254 10/14/20 0258 10/14/20 0737 10/14/20 1549 10/14/20 1655 10/14/20 1701 10/15/20 0410 10/16/20 0435 10/17/20 0346 10/17/20 0348  WBC 19.7*  --  19.8*  --  24.3*  --  27.9* 29.4* 27.2*  --   NEUTROABS 14.7*  --   --   --   --   --  22.6* 24.4* 22.6*  --   HGB  8.2*   < > 9.2*   < > 8.9* 9.2* 8.2* 7.3* 8.0* 8.5*  HCT 25.0*   < > 26.7*   < > 26.9* 27.0* 25.5* 22.7* 23.8* 25.0*  MCV 84.5  --  82.7  --  84.9  --  86.7 87.6 87.2  --   PLT 119*  --  131*  --  165  --  184 232 262  --    < > = values in this interval not displayed.    INR: Recent Labs  Lab 10/13/20 2112 10/14/20 0254 10/15/20 0410 10/16/20 0435 10/17/20 0346  INR 1.5* 1.4* 1.6* 1.7* 1.7*    Other results:  EKG:   Imaging: DG Chest Port 1 View  Result Date: 10/16/2020 CLINICAL DATA:  Left ventricular assist device.  Chest tube present. EXAM: PORTABLE CHEST 1 VIEW COMPARISON:  October 15, 2020 FINDINGS: Chest tube unchanged in position on the right. Swan-Ganz catheter tip is in the right main pulmonary artery. There is a left ventricular assist device. Pacemaker leads attached to the right atrium and right ventricle. No evident pneumothorax. Atelectatic change in the left lower lung region and medial right base are again noted. No new opacity evident. Stable cardiac prominence. Status post coronary artery bypass  grafting. No adenopathy. No bone lesions. IMPRESSION: Tube and catheter positions as described without pneumothorax. Stable cardiac prominence. Stable left lower lung region and medial right base atelectasis. Postoperative changes noted. Electronically Signed   By: Lowella Grip III M.D.   On: 10/16/2020 08:03      Medications:     Scheduled Medications: . sodium chloride   Intravenous Once  . acetaminophen  1,000 mg Oral Q6H   Or  . acetaminophen (TYLENOL) oral liquid 160 mg/5 mL  1,000 mg Per Tube Q6H  . aspirin EC  325 mg Oral Daily   Or  . aspirin  324 mg Per Tube Daily   Or  . aspirin  300 mg Rectal Daily  . Chlorhexidine Gluconate Cloth  6 each Topical Daily  . docusate sodium  100 mg Oral Daily  . feeding supplement  237 mL Oral BID BM  . insulin aspart  0-15 Units Subcutaneous Q4H  . insulin detemir  15 Units Subcutaneous BID  . lidocaine  1 patch Transdermal Q24H  . mouth rinse  15 mL Mouth Rinse BID  . metoCLOPramide (REGLAN) injection  5 mg Intravenous Q12H  . metolazone  2.5 mg Oral Once  . midodrine  10 mg Oral TID WC  . multivitamin with minerals  1 tablet Oral Daily  . pantoprazole  40 mg Oral Daily  . sodium chloride flush  10-40 mL Intracatheter Q12H  . sodium chloride flush  3 mL Intravenous Q12H  . warfarin  1 mg Oral q1600  . Warfarin - Physician Dosing Inpatient   Does not apply q1600     Infusions: . sodium chloride Stopped (10/16/20 1426)  . sodium chloride    . sodium chloride 10 mL/hr at 10/14/20 1040  . amiodarone 30 mg/hr (10/17/20 0700)  . epinephrine 6 mcg/min (10/17/20 0700)  . furosemide (LASIX) 200 mg in dextrose 5% 100 mL (2mg /mL) infusion 20 mg/hr (10/17/20 0700)  . lactated ringers    . lactated ringers 10 mL/hr at 10/17/20 0700  . milrinone 0.25 mcg/kg/min (10/17/20 0700)  . norepinephrine (LEVOPHED) Adult infusion 10 mcg/min (10/17/20 0700)  . vasopressin 0.02 Units/min (10/17/20 0806)     PRN Medications:  sodium  chloride, dextrose, morphine injection,  ondansetron (ZOFRAN) IV, oxyCODONE, sodium chloride flush, sodium chloride flush, traMADol   Assessment:   1. Acute on chronic systolic congestive heart failure due to ischemic cardiomyopathy with ejection fraction of 20 to 25% admitted with class IIIb symptoms. POD 4 HM3 LVAD.  2. CAD s/p CABG 2002 with patent grafts on last cath.  3. Stage 3 CKD due to diabetic nephropathy and cardiorenal syndrome. Baseline recently 1.8. creat up to 3.0 today which is not unexpected with pump run, diuresis. Continue to maintain adequate MAP and expect that to improve with time.  4. Atrial fib/flutter: Sinus alternating with AF on amio IV.  5. Right brachial plexus stretch from preop Impella and hematoma around the axillary artery. This should improve with time.  6. Expected acute postop blood loss anemia. His Hgb dropped from 8.2 to 7.3 yesterday and transfused 1 unit. Hgb improved to 8.0 today. 7. Nurse reports a couple blisters on buttocks probably due to edema and pressure. Turn frequently and observe.  8. DM: glucose under good control on Levemir and SSI. Had hypoglycemia to 69 this am. Will decrease Levemir to 15 bid.   Plan/Discussion:    He has been hemodynamically stable with good CI, VAD flow and Co-ox. CVP and PAP elevated due to volume excess which we have not been able to diurese off yet due to acute kidney injury on CKD. Hopefully this will improve with stable hemodynamics and time. Continue present support.  DC both pleural tubes and pocket drain.  Have PICC line inserted for access and remove swan and sleeve after that. Follow CVP via PICC.  OOB to chair  Encourage nutrition  PT.  INR 1.7 the past two days. Will increase Coumadin to 2 mg daily. He is drinking Ensure which may counteract that.  Length of Stay: [address]  Fernande Boyden [facility] 10/17/2020, 8:49 AM

## 2020-10-17 NOTE — Progress Notes (Signed)
Nutrition Follow Up  DOCUMENTATION CODES:   Not applicable  INTERVENTION:   No BM x 5 days- strengthen bowel regimen  Tube feeding:  -Vital 1.5 @ 20 ml/hr via Cortrak  -Increase by 10 ml Q12 hours to goal rate of 60 ml/hr (1440 ml) -ProSource TF 45 ml TID  Provides: 2280 kcals, 130 grams protein, 1100 ml free water.   Consider transition to nocturnal once continuous toletrance is established and patient shows progression in PO intake  Add B complex with Vitamin C if started on CRRT   Ensure Enlive po BID, each supplement provides 350 kcal and 20 grams of protein  NUTRITION DIAGNOSIS:   Increased nutrient needs related to chronic illness (CHF) as evidenced by estimated needs.  Ongoing  GOAL:   Patient will meet greater than or equal to 90% of their needs   Progressing   MONITOR:   PO intake,Supplement acceptance,Labs,Weight trends,Skin,I & O's  REASON FOR ASSESSMENT:   Consult LVAD Eval  ASSESSMENT:   Marcus Johnson. is a 60 y.o. male who has a h/o DM2, CAD s/p CABG 8003, systolic HF due to ischemic cardiomyopathy with EF 20-25% (echo 12/15), DM2 and CKD. He is s/p Medtronic single chamber ICD.Pt admitted with CHF.  3/01- s/p impella  3/04- s/p multiple tooth extraction  3/10- s/p impella removal, LVAD implantation  3/11- extubated   Pt discussed during ICU rounds and with RN.   Remains on pressors. Attempting to diurese with lasix, creatinine continues to rise. Per renal, suspect patient will require CRRT over the next 24-48 hrs. Positive for abdominal distention. No BM x 5 days, may need to strengthen regimen. Cortrak placed at bedside. Titrate TF to goal slowly until patient has BM.   Had hypoglycemic episode this am. Insulin adjusted. Suspect improvement once TF is started.   Admission weight: 97.5 kg  Current weight: 96.5 kg   UOP: 1335 ml x 24 hrs  Chest tubes: 450 ml x 24 hrs   Drips: epinephrine, 200 mg lasix in D5 @ 10 ml/hr,  levophed, vasopressin Medications: colace, SS novolog, levemir, 5 mg reglan BID Labs: CBG 58-98 Cr 3- trending up Phosphorus 4.7 (H) Mg 2.6 (H)   Diet Order:   Diet Order            Diet clear liquid Room service appropriate? Yes; Fluid consistency: Thin  Diet effective now                 EDUCATION NEEDS:   Education needs have been addressed  Skin:  Skin Assessment: Skin Integrity Issues: Skin Integrity Issues:: Incisions Incisions: chest x3 Stage II: coccyx x3 Other: skin tear R chest, skin tear L arm,   Last BM:  3/9  Height:   Ht Readings from Last 1 Encounters:  10/03/20 5' 10.5" (1.791 m)    Weight:   Wt Readings from Last 1 Encounters:  10/17/20 96.5 kg    Ideal Body Weight:  76.8 kg  BMI:  Body mass index is 30.09 kg/m.  Estimated Nutritional Needs:   Kcal:  2250-2450 kcal  Protein:  115-130 grams  Fluid:  2 L  Mariana Single RD, LDN Clinical Nutrition Pager listed in [place]

## 2020-10-17 NOTE — Progress Notes (Signed)
LVAD Coordinator Rounding Note:  Admitted 09/28/20 due to worsening heart failure symptoms. Recent COVID infection January 2022; did not take medications for 3-4 weeks.   S/p dental extractions 10/07/20 per Dr Benson Norway.  HM III LVAD implanted on 10/13/20 by Dr Cyndia Bent under Destination Therapy criteria.  Pt states that he feels very weak. Pt has not been able to dangle per bedside nurse.  Speed increased to 5500 over the weekend.  Diuresing with lasix drip at 20 mg per hour. Remains +.   Creatinine 2.08 -> 2.54>3.0 . WBC 28-> 29>27.2K  LDH 293>444>597>593>670  CVP 22 PA 54/20  Thermo CO 5.8/2.8  Vital signs: Temp: 98.4 HR: 111 Doppler Pressure: 88 Arterial BP: 83/66 (75) Automatic BP:  O2 Sat: 93% on 2L/[place] Wt: 200.1>212.7 lbs     LVAD interrogation reveals:  Speed: 5500 Flow: 5.3 Power: 4.1 w PI: 2.0 Hematocrit: 24  Alarms: none Events: 2 PI today   Fixed speed: 5500 Low speed limit: 5200  Drive Line: Existing VAD dressing removed and site care performed using sterile technique. Drive line exit site cleaned with Chlora prep applicators x 2, allowed to dry, and gauze dressing with silver strip re-applied. Exit site healing and unincorporated the velour is fully implanted at exit site. There is 1 suture visible that is wrapped around the driveline. Moderate amount of sanguineous drainage on gauze dressing. No redness, tenderness, foul odor or rash noted. Few inches of drive line from exit site to modular cable connection, making positioning/anchoring of drive line difficult. Drive line anchor correctly applied. Continue daily dressing changes by VAD coordinator or nurse champion. Next dressing change is due 10/18/20.      Labs:  LDH trend: 444>670  INR trend: 1.4>1.7  Creatinine: 1.84>3.0  AST/ALT: 138/29  Anticoagulation Plan: -INR Goal: 2.0-2.5 -ASA Dose: 325 mg  Blood Products:  IntraOp:   1 Platelet  6 FFP  3 PRBC  1035 cell saver Postop 10/13/20:    4 FFP  4 PRBC    10/14/20:  1 FFP  1 PRBC   10/16/20:  1 PRBC  Device: - Medtronic single -Therapies: off   Arrythmias: VT in OR requiring defibrillation   Drips: Epinephrine 6 mcg/min Levophed 10 mcg/min Vasopressin 0.02 units/hr Milrinone 0.25 mcg/kg/min Lasix 10 mg/hr Amiodarone 30 mg/hr  Patient Education: 1. Pt educated on the need for hat/mask during driveline dressing change. Introduced to batteries and changing power source.  Plan/Recommendations:  1. Call VAD coordinator for equipment or drive line issues 2. Daily drive line dressing change per VAD coordinator or nurse champion. Next dressing change due 10/18/20 3. VAD coordinator and Dr. Linna Hoff will be present for bedside echo at [place] RN [place] Coordinator  Office: [phone]  24/7 Pager: [phone]

## 2020-10-17 NOTE — Progress Notes (Addendum)
Patient ID: Marcus Sendra., male   DOB: Feb 28, 1961, 60 y.o.   MRN: [id number]      Advanced Heart Failure Rounding Note  PCP-Cardiologist: No primary care provider on file.   Subjective:    - 2/27 Milrinone switched to dobutamine 5 mcg.  - 2/28 Moved to ICU. Dobutamine increased to 7.5 mcg and Norepi 3 mcg added. Diuretics held.  - 3/1 Impella 5.5 placed.  - 3/4 Teeth removed - 3/10 HM3 LVAD placed -3/13 VAD speed increased to 5500  On milrinone 0.25, NE 10, epi 6, vaso 0.02 with CI 2.8, Co-ox 62%   Diuresing with lasix drip at 20 mg per hour. Remains +.   Creatinine 2.08 -> 2.54>3.0 . WBC 28-> 29>27.2K  LDH 293>444>597>593>670  Hgb 8 - Getting 1u RBCs  CVP 22 PA 54/20  Thermo CO 5.8/2.8  Echo: EF 20-25%, normal RV, PASP 61, dilated IVC.   LVAD Interrogation HM 3: Speed: 5500 Flow:5.3  PI: 1.9 Power: 4.1. VAD interrogated personally. Parameters stable.  Objective:   Weight Range: 96.5 kg Body mass index is 30.09 kg/m.   Vital Signs:   Temp:  [98.42 F (36.9 C)-99 F (37.2 C)] 98.42 F (36.9 C) (03/14 0700) Pulse Rate:  [41-120] 111 (03/14 0700) Resp:  [8-27] 21 (03/14 0700) BP: (87-102)/(63-80) 102/75 (03/13 2000) SpO2:  [90 %-100 %] 93 % (03/14 0700) Arterial Line BP: (83-102)/(60-86) 83/66 (03/14 0700) Weight:  [96.5 kg] 96.5 kg (03/14 0500) Last BM Date: 10/12/20  Weight change: Filed Weights   10/15/20 0400 10/16/20 0600 10/17/20 0500  Weight: 100.6 kg 99.6 kg 96.5 kg    Intake/Output:   Intake/Output Summary (Last 24 hours) at 10/17/2020 0843 Last data filed at 10/17/2020 0800 Gross per 24 hour  Intake 2572.9 ml  Output 1850 ml  Net 722.9 ml      Physical Exam  CVP 22  Physical Exam: GENERAL: No acute distress. HEENT: normal  NECK: Supple, JVP to jaw.  2+ bilaterally, no bruits.  No lymphadenopathy or thyromegaly appreciated.  RIJ swan  CARDIAC:  Mechanical heart sounds with LVAD hum present.  LUNGS:  Clear to auscultation  bilaterally.  ABDOMEN:  Soft, round, nontender, positive bowel sounds x4.     LVAD exit site:  Dressing dry and intact.   Stabilization device present and accurately applied.  Driveline dressing is being changed daily per sterile technique. EXTREMITIES:  Warm and dry, no cyanosis, clubbing, rash or edema . LUE art line  NEUROLOGIC:  Alert and oriented x 3.    No aphasia.  No dysarthria.  Affect pleasant.      Telemetry    A Fib 80-90s   Labs    CBC Recent Labs    10/16/20 0435 10/17/20 0346 10/17/20 0348  WBC 29.4* 27.2*  --   NEUTROABS 24.4* 22.6*  --   HGB 7.3* 8.0* 8.5*  HCT 22.7* 23.8* 25.0*  MCV 87.6 87.2  --   PLT 232 262  --    Basic Metabolic Panel Recent Labs    62/95/28 0435 10/17/20 0346 10/17/20 0348  NA 138 138 140  K 3.9 3.8 3.8  CL 105 105  --   CO2 21* 21*  --   GLUCOSE 159* 75  --   BUN 38* 45*  --   CREATININE 2.54* 3.00*  --   CALCIUM 8.0* 8.1*  --   MG 2.4 2.6*  --   PHOS 4.6 4.7*  --    Liver Function Tests Recent  Labs    10/15/20 0410 10/16/20 0435  AST 188* 145*  ALT 38 51*  ALKPHOS 71 82  BILITOT 3.5* 3.1*  PROT 6.0* 6.2*  ALBUMIN 2.6* 2.4*   No results for input(s): LIPASE, AMYLASE in the last 72 hours. Cardiac Enzymes No results for input(s): CKTOTAL, CKMB, CKMBINDEX, TROPONINI in the last 72 hours.  BNP: BNP (last 3 results) Recent Labs    10/14/20 0254  BNP 1,097.8*    ProBNP (last 3 results) No results for input(s): PROBNP in the last 8760 hours.   D-Dimer No results for input(s): DDIMER in the last 72 hours. Hemoglobin A1C No results for input(s): HGBA1C in the last 72 hours. Fasting Lipid Panel No results for input(s): CHOL, HDL, LDLCALC, TRIG, CHOLHDL, LDLDIRECT in the last 72 hours. Thyroid Function Tests No results for input(s): TSH, T4TOTAL, T3FREE, THYROIDAB in the last 72 hours.  Invalid input(s): FREET3  Other results:   Imaging    No results found.   Medications:     Scheduled  Medications: . sodium chloride   Intravenous Once  . acetaminophen  1,000 mg Oral Q6H   Or  . acetaminophen (TYLENOL) oral liquid 160 mg/5 mL  1,000 mg Per Tube Q6H  . aspirin EC  325 mg Oral Daily   Or  . aspirin  324 mg Per Tube Daily   Or  . aspirin  300 mg Rectal Daily  . Chlorhexidine Gluconate Cloth  6 each Topical Daily  . docusate sodium  100 mg Oral Daily  . feeding supplement  237 mL Oral BID BM  . insulin aspart  0-15 Units Subcutaneous Q4H  . insulin detemir  20 Units Subcutaneous BID  . lidocaine  1 patch Transdermal Q24H  . mouth rinse  15 mL Mouth Rinse BID  . metoCLOPramide (REGLAN) injection  5 mg Intravenous Q12H  . metolazone  2.5 mg Oral Once  . midodrine  10 mg Oral TID WC  . multivitamin with minerals  1 tablet Oral Daily  . pantoprazole  40 mg Oral Daily  . sodium chloride flush  10-40 mL Intracatheter Q12H  . sodium chloride flush  3 mL Intravenous Q12H  . warfarin  1 mg Oral q1600  . Warfarin - Physician Dosing Inpatient   Does not apply q1600    Infusions: . sodium chloride Stopped (10/16/20 1426)  . sodium chloride    . sodium chloride 10 mL/hr at 10/14/20 1040  . amiodarone 30 mg/hr (10/17/20 0700)  . epinephrine 6 mcg/min (10/17/20 0700)  . furosemide (LASIX) 200 mg in dextrose 5% 100 mL (2mg /mL) infusion 20 mg/hr (10/17/20 0700)  . lactated ringers    . lactated ringers 10 mL/hr at 10/17/20 0700  . milrinone 0.25 mcg/kg/min (10/17/20 0700)  . norepinephrine (LEVOPHED) Adult infusion 10 mcg/min (10/17/20 0700)  . vasopressin 0.02 Units/min (10/17/20 0806)    PRN Medications: sodium chloride, dextrose, morphine injection, ondansetron (ZOFRAN) IV, oxyCODONE, sodium chloride flush, sodium chloride flush, traMADol    Assessment/Plan   1.Acute on chronic systolic HF -> cardiogenic shock:ICM.HasMedtronic ICD.EF 15%. Low output persisted despite milrinone and then dobutamine with rise in creatinine to 2.5. Impella 5.5 placed 3/1.  Creatinine  improved to 1.7 and HM3 LVAD was placed on 3/10.  Patient currently on milrinone 0.25, NE 10, epi 6, vaso 0.02 with CI 2.8, Co-ox 62%.  - CVP 22. Continue lasix gtt to 20. Consulting nephrology.  2. LVAD - 3/13 VAD speed increased to 5500. - ASA 325 until INR  up to 2. INR 1.7 today.  - LDH trending up  293>444>597>593>670 - Dosing  anticoagulation per CT surgery.   - DL site ok 3. CAD s/p CABG2002:Last cath in 6/19 with patent grafts.  - Continue Crestor 40 mg daily.  4. AKI on CKD Stage 3b: Suspect that this is a combination of cardiorenal syndrome and diabetic nephropathy and possibly ATN.  - creatinine trending back up post-op 2.1-> 2.5>3 . Will continue hemodynamic support.  - Consult Nephrology.  5. Atrial flutter/fibrillation: s/p DC-CV 08/21/18.He has been in rate-controlled atrial fibrillation.  - Continue amiodarone 200 mg bid.   - Will eventually resume anticoagulation.  6. Wound R Foot: Partial thickness skin loss noted. Cover with mepilex border and change every 5 days.  7. Right arm pain: Think neuropathic, per Dr. Laneta Simmers patient had stretch of brachial plexus with Impella placement.  8. ID: WBC [id number] Afebrile, negative cultures. Follow closely  9. XB:JYNWGNF of very poor control but hgbA1ctrending down. Most recent 7.7%  - Cover with SSI.  Check limited ECHO.  Consult Nephrology.    Length of Stay: 31  Amy Clegg, NP  10/17/2020, 8:43 AM  Advanced Heart Failure Team Pager [phone] (M-F; 7a - 4p)  Please contact [facility] for night-coverage after hours (4p -7a ) and weekends on [email or web address]  Agree with above.   Remains on multiple pressors/inotropes. Co-ox ok but filling pressures remain high. On lasix gtt but only modest urine output as renal function worsening. Creatinine now up to 3.0 .  Feels weak. Hard to stand. No BM yet  General:  Sitting up in bed very weak  HEENT: normal  Neck: supple. RIJ swan   Carotids 2+ bilat; no bruits. No  lymphadenopathy or thryomegaly appreciated. Cor: LVAD hum.  Lungs: Decreased at bases Abdomen: obese soft, nontender, + distended. No hepatosplenomegaly. No bruits or masses. Hypoactive bowel sounds. Driveline site clean. Anchor in place.  Extremities: no cyanosis, clubbing, rash. Warm 1-2+ edema  Neuro: alert & oriented x 3. No focal deficits. Moves all 4 without problem   Continues to struggle post-VAD. Remains on multiple pressors. Creatinine worse. Will continue current regimen. Consult Renal. Would like to hold off on CRRT one more day if possible. Will check echo later this am and adjust VAD speed further as tolerated. Agree with 1uRBCs today. Otherwise limit volume. D/w Dr. Laneta Simmers at bedside.   CRITICAL CARE Performed by: Arvilla Meres  Total critical care time: 35 minutes  Critical care time was exclusive of separately billable procedures and treating other patients.  Critical care was necessary to treat or prevent imminent or life-threatening deterioration.  Critical care was time spent personally by me (independent of midlevel providers or residents) on the following activities: development of treatment plan with patient and/or surrogate as well as nursing, discussions with consultants, evaluation of patient's response to treatment, examination of patient, obtaining history from patient or surrogate, ordering and performing treatments and interventions, ordering and review of laboratory studies, ordering and review of radiographic studies, pulse oximetry and re-evaluation of patient's condition.  Arvilla Meres, MD  9:14 AM

## 2020-10-18 ENCOUNTER — Inpatient Hospital Stay (HOSPITAL_COMMUNITY): Payer: Medicare HMO

## 2020-10-18 DIAGNOSIS — Z95811 Presence of heart assist device: Secondary | ICD-10-CM | POA: Diagnosis not present

## 2020-10-18 DIAGNOSIS — I5023 Acute on chronic systolic (congestive) heart failure: Secondary | ICD-10-CM | POA: Diagnosis not present

## 2020-10-18 LAB — RENAL FUNCTION PANEL
Albumin: 2.3 g/dL — ABNORMAL LOW (ref 3.5–5.0)
Anion gap: 13 (ref 5–15)
BUN: 47 mg/dL — ABNORMAL HIGH (ref 6–20)
CO2: 20 mmol/L — ABNORMAL LOW (ref 22–32)
Calcium: 8.2 mg/dL — ABNORMAL LOW (ref 8.9–10.3)
Chloride: 104 mmol/L (ref 98–111)
Creatinine, Ser: 3.27 mg/dL — ABNORMAL HIGH (ref 0.61–1.24)
GFR, Estimated: 21 mL/min — ABNORMAL LOW (ref >60)
Glucose, Bld: 170 mg/dL — ABNORMAL HIGH (ref 70–99)
Phosphorus: 4.7 mg/dL — ABNORMAL HIGH (ref 2.5–4.6)
Potassium: 3.5 mmol/L (ref 3.5–5.1)
Sodium: 137 mmol/L (ref 135–145)

## 2020-10-18 LAB — CBC WITH DIFFERENTIAL/PLATELET
Abs Immature Granulocytes: 0.29 10*3/uL — ABNORMAL HIGH (ref 0.00–0.07)
Basophils Absolute: 0 10*3/uL (ref 0.0–0.1)
Basophils Relative: 0 %
Eosinophils Absolute: 0.1 10*3/uL (ref 0.0–0.5)
Eosinophils Relative: 1 %
HCT: 22.3 % — ABNORMAL LOW (ref 39.0–52.0)
Hemoglobin: 7 g/dL — ABNORMAL LOW (ref 13.0–17.0)
Immature Granulocytes: 2 %
Lymphocytes Relative: 7 %
Lymphs Abs: 1.2 10*3/uL (ref 0.7–4.0)
MCH: 28.6 pg (ref 26.0–34.0)
MCHC: 31.4 g/dL (ref 30.0–36.0)
MCV: 91 fL (ref 80.0–100.0)
Monocytes Absolute: 1.6 10*3/uL — ABNORMAL HIGH (ref 0.1–1.0)
Monocytes Relative: 9 %
Neutro Abs: 14.7 10*3/uL — ABNORMAL HIGH (ref 1.7–7.7)
Neutrophils Relative %: 81 %
Platelets: 246 10*3/uL (ref 150–400)
RBC: 2.45 MIL/uL — ABNORMAL LOW (ref 4.22–5.81)
RDW: 17.9 % — ABNORMAL HIGH (ref 11.5–15.5)
WBC: 17.9 10*3/uL — ABNORMAL HIGH (ref 4.0–10.5)
nRBC: 1.6 % — ABNORMAL HIGH (ref 0.0–0.2)

## 2020-10-18 LAB — CBC
HCT: 26 % — ABNORMAL LOW (ref 39.0–52.0)
Hemoglobin: 8.1 g/dL — ABNORMAL LOW (ref 13.0–17.0)
MCH: 28 pg (ref 26.0–34.0)
MCHC: 31.2 g/dL (ref 30.0–36.0)
MCV: 90 fL (ref 80.0–100.0)
Platelets: 258 10*3/uL (ref 150–400)
RBC: 2.89 MIL/uL — ABNORMAL LOW (ref 4.22–5.81)
RDW: 17.8 % — ABNORMAL HIGH (ref 11.5–15.5)
WBC: 17.6 10*3/uL — ABNORMAL HIGH (ref 4.0–10.5)
nRBC: 1.5 % — ABNORMAL HIGH (ref 0.0–0.2)

## 2020-10-18 LAB — BASIC METABOLIC PANEL
Anion gap: 12 (ref 5–15)
BUN: 52 mg/dL — ABNORMAL HIGH (ref 6–20)
CO2: 21 mmol/L — ABNORMAL LOW (ref 22–32)
Calcium: 7.5 mg/dL — ABNORMAL LOW (ref 8.9–10.3)
Chloride: 102 mmol/L (ref 98–111)
Creatinine, Ser: 3.5 mg/dL — ABNORMAL HIGH (ref 0.61–1.24)
GFR, Estimated: 19 mL/min — ABNORMAL LOW (ref >60)
Glucose, Bld: 191 mg/dL — ABNORMAL HIGH (ref 70–99)
Potassium: 3.4 mmol/L — ABNORMAL LOW (ref 3.5–5.1)
Sodium: 135 mmol/L (ref 135–145)

## 2020-10-18 LAB — GLUCOSE, CAPILLARY
Glucose-Capillary: 109 mg/dL — ABNORMAL HIGH (ref 70–99)
Glucose-Capillary: 128 mg/dL — ABNORMAL HIGH (ref 70–99)
Glucose-Capillary: 162 mg/dL — ABNORMAL HIGH (ref 70–99)
Glucose-Capillary: 173 mg/dL — ABNORMAL HIGH (ref 70–99)
Glucose-Capillary: 192 mg/dL — ABNORMAL HIGH (ref 70–99)
Glucose-Capillary: 205 mg/dL — ABNORMAL HIGH (ref 70–99)
Glucose-Capillary: 84 mg/dL (ref 70–99)

## 2020-10-18 LAB — COOXEMETRY PANEL
Carboxyhemoglobin: 1.6 % — ABNORMAL HIGH (ref 0.5–1.5)
Methemoglobin: 1.1 % (ref 0.0–1.5)
O2 Saturation: 74.2 %
Total hemoglobin: 7.9 g/dL — ABNORMAL LOW (ref 12.0–16.0)

## 2020-10-18 LAB — PHOSPHORUS: Phosphorus: 5.2 mg/dL — ABNORMAL HIGH (ref 2.5–4.6)

## 2020-10-18 LAB — PREPARE RBC (CROSSMATCH)

## 2020-10-18 LAB — UREA NITROGEN, URINE: Urea Nitrogen, Ur: 183 mg/dL

## 2020-10-18 LAB — PROTIME-INR
INR: 1.8 — ABNORMAL HIGH (ref 0.8–1.2)
Prothrombin Time: 20.3 seconds — ABNORMAL HIGH (ref 11.4–15.2)

## 2020-10-18 LAB — MAGNESIUM: Magnesium: 2.4 mg/dL (ref 1.7–2.4)

## 2020-10-18 LAB — LACTATE DEHYDROGENASE: LDH: 575 U/L — ABNORMAL HIGH (ref 98–192)

## 2020-10-18 MED ORDER — WHITE PETROLATUM EX OINT: TOPICAL_OINTMENT | CUTANEOUS | Status: DC | PRN

## 2020-10-18 MED ORDER — SODIUM CHLORIDE 0.9% IV SOLUTION: Freq: Once | INTRAVENOUS | Status: AC

## 2020-10-18 MED ORDER — PRISMASOL BGK 4/2.5 32-4-2.5 MEQ/L REPLACEMENT SOLN: Status: DC

## 2020-10-18 MED ORDER — HEPARIN (PORCINE) 2000 UNITS/L FOR CRRT: INTRAVENOUS_CENTRAL | Status: DC | PRN

## 2020-10-18 MED ORDER — PRISMASOL BGK 4/2.5 32-4-2.5 MEQ/L EC SOLN: Status: DC

## 2020-10-18 MED ORDER — HEPARIN SODIUM (PORCINE) 1000 UNIT/ML DIALYSIS
1000.0000 [IU] | INTRAMUSCULAR | Status: DC | PRN
Start: 2020-10-18 — End: 2020-10-26
  Administered 2020-10-25: 2400 [IU] via INTRAVENOUS_CENTRAL
  Filled 2020-10-18: qty 3
  Filled 2020-10-18: qty 4
  Filled 2020-10-18: qty 3

## 2020-10-18 MED ORDER — MILRINONE LACTATE IN DEXTROSE 20-5 MG/100ML-% IV SOLN
0.1250 ug/kg/min | INTRAVENOUS | Status: DC
  Administered 2020-10-18 – 2020-10-25 (×7): 0.125 ug/kg/min via INTRAVENOUS
  Filled 2020-10-18 (×7): qty 100

## 2020-10-18 MED ORDER — POLYETHYLENE GLYCOL 3350 17 G PO PACK
17.0000 g | PACK | Freq: Every day | ORAL | Status: DC
  Administered 2020-10-18 – 2020-10-28 (×10): 17 g via ORAL
  Filled 2020-10-18 (×10): qty 1

## 2020-10-18 MED FILL — Mannitol IV Soln 20%: INTRAVENOUS | Qty: 500 | Status: AC

## 2020-10-18 MED FILL — Electrolyte-R (PH 7.4) Solution: INTRAVENOUS | Qty: 4000 | Status: AC

## 2020-10-18 MED FILL — Heparin Sodium (Porcine) Inj 1000 Unit/ML: INTRAMUSCULAR | Qty: 20 | Status: AC

## 2020-10-18 MED FILL — Sodium Chloride IV Soln 0.9%: INTRAVENOUS | Qty: 3000 | Status: AC

## 2020-10-18 MED FILL — Calcium Chloride Inj 10%: INTRAVENOUS | Qty: 10 | Status: AC

## 2020-10-18 MED FILL — Sodium Bicarbonate IV Soln 8.4%: INTRAVENOUS | Qty: 50 | Status: AC

## 2020-10-18 NOTE — Plan of Care (Signed)
  Problem: Education: Goal: Knowledge of General Education information will improve Description: Including pain rating scale, medication(s)/side effects and non-pharmacologic comfort measures Outcome: Progressing   Problem: Health Behavior/Discharge Planning: Goal: Ability to manage health-related needs will improve Outcome: Progressing   Problem: Clinical Measurements: Goal: Ability to maintain clinical measurements within normal limits will improve Outcome: Progressing Goal: Will remain free from infection Outcome: Progressing Goal: Diagnostic test results will improve Outcome: Progressing Goal: Cardiovascular complication will be avoided Outcome: Progressing   Problem: Activity: Goal: Risk for activity intolerance will decrease Outcome: Progressing   Problem: Nutrition: Goal: Adequate nutrition will be maintained Outcome: Progressing   Problem: Coping: Goal: Level of anxiety will decrease Outcome: Progressing   Problem: Elimination: Goal: Will not experience complications related to bowel motility Outcome: Progressing Goal: Will not experience complications related to urinary retention Outcome: Progressing   Problem: Pain Managment: Goal: General experience of comfort will improve Outcome: Progressing   Problem: Safety: Goal: Ability to remain free from injury will improve Outcome: Progressing   Problem: Skin Integrity: Goal: Risk for impaired skin integrity will decrease Outcome: Progressing   Problem: Education: Goal: Ability to demonstrate management of disease process will improve Outcome: Progressing Goal: Ability to verbalize understanding of medication therapies will improve Outcome: Progressing Goal: Individualized Educational Video(s) Outcome: Progressing   Problem: Activity: Goal: Capacity to carry out activities will improve Outcome: Progressing   Problem: Cardiac: Goal: Ability to achieve and maintain adequate cardiopulmonary perfusion will  improve Outcome: Progressing   Problem: Education: Goal: Knowledge of disease or condition will improve Outcome: Progressing Goal: Understanding of medication regimen will improve Outcome: Progressing Goal: Individualized Educational Video(s) Outcome: Progressing   Problem: Activity: Goal: Ability to tolerate increased activity will improve Outcome: Progressing   Problem: Cardiac: Goal: Ability to achieve and maintain adequate cardiopulmonary perfusion will improve Outcome: Progressing   Problem: Health Behavior/Discharge Planning: Goal: Ability to safely manage health-related needs after discharge will improve Outcome: Progressing   Problem: Education: Goal: Understanding of CV disease, CV risk reduction, and recovery process will improve Outcome: Progressing Goal: Individualized Educational Video(s) Outcome: Progressing   Problem: Activity: Goal: Ability to return to baseline activity level will improve Outcome: Progressing   Problem: Cardiovascular: Goal: Ability to achieve and maintain adequate cardiovascular perfusion will improve Outcome: Progressing Goal: Vascular access site(s) Level 0-1 will be maintained Outcome: Progressing   Problem: Health Behavior/Discharge Planning: Goal: Ability to safely manage health-related needs after discharge will improve Outcome: Progressing

## 2020-10-18 NOTE — Progress Notes (Signed)
Patient ID: Marcus Colberg., male   DOB: [birth year], 60 y.o.   MRN: [id number] [facility] Progress Note   Assessment/ Plan:   1.  Acute kidney injury on chronic kidney disease stage IIIa: With underlying chronic kidney disease likely from diabetes/hypertension and worsening renal function from hemodynamically mediated ATN versus cardiorenal syndrome.  Poor response to diuretics overnight with worsening BUN/creatinine-begin CRRT today for extracorporeal volume unloading/clearance.  I appreciate the assistance from Dr. Haroldine Laws in placing his dialysis catheter. 2.  Acute exacerbation of chronic systolic heart failure: With evidence of cardiogenic shock status post LVAD.  Remains on pressors with norepinephrine, epinephrine and vasopressin along with inotropic support from dobutamine.  Begin extracorporeal volume unloading given limited response to diuretics.  3.  Anemia: Likely secondary to chronic disease and exacerbated by recent surgical losses.  Plans noted for PRBC transfusion again today. 4.  Atrial fibrillation: Intermittently tachycardic, continue amiodarone and resume anticoagulation when permitted.  Subjective:   Without acute clinical events overnight, unimpressive urine output overnight with continued hypotension on pressors   Objective:   BP (!) 76/52   Pulse 100   Temp 98.3 F (36.8 C) (Oral)   Resp 16   Ht 5' 10.5" (1.791 m)   Wt 101.2 kg   SpO2 94%   BMI 31.56 kg/m   Intake/Output Summary (Last 24 hours) at 10/18/2020 0732 Last data filed at 10/18/2020 0700 Gross per 24 hour  Intake 2029.61 ml  Output 825 ml  Net 1204.61 ml   Weight change: 4.7 kg  Physical Exam: Gen: Appears somnolent resting in bed, easy to awaken/engage in conversation CVS: Irregular tachycardia, S1 and S2 normal Resp: Anteriorly clear to auscultation, diminished breath sounds over bases Abd: Soft, moderately distended, LVAD driveline noted Ext: 1+ lower extremity edema  with 2+ upper extremity edema  Imaging: US RENAL  Result Date: 10/17/2020 CLINICAL DATA:  Acute on chronic renal insufficiency EXAM: RENAL / URINARY TRACT ULTRASOUND COMPLETE COMPARISON:  09/30/2020 FINDINGS: Right Kidney: Renal measurements: 13.1 x 5.7 x 7.2 cm = volume: 282.3 mL. Echogenicity within normal limits. No mass or hydronephrosis visualized. Left Kidney: Renal measurements: 12.7 x 5.8 x 6.7 cm = volume: 255.8 mL. Echogenicity within normal limits. No mass or hydronephrosis visualized. Bladder: Bladder is decompressed with a Foley catheter. Other: None. IMPRESSION: 1. Unremarkable renal ultrasound. Electronically Signed   By: Randa Ngo M.D.   On: 10/17/2020 22:25   DG Chest Port 1 View  Result Date: 10/17/2020 CLINICAL DATA:  Status post right-sided PICC line placement. EXAM: PORTABLE CHEST 1 VIEW COMPARISON:  October 16, 2020 (5:29 a.m.) FINDINGS: A single lead ventricular pacer is noted. Multiple sternal wires and vascular clips are seen. There is stable Swan-Ganz catheter and nasogastric tube positioning. Interval PICC line placement is noted with its distal end seen at the junction of the superior vena cava and right atrium. Mild atelectasis and/or infiltrate is seen within the left lung base. There is no evidence of a pleural effusion or pneumothorax. Mild to moderate severity enlargement of the cardiac silhouette is noted. The visualized skeletal structures are unremarkable. IMPRESSION: 1. Interval PICC line placement and positioning, as described above. 2. No additional significant interval changes when compared to the prior exam. Electronically Signed   By: Virgina Norfolk M.D.   On: 10/17/2020 15:34   DG Abd Portable 1V  Result Date: 10/17/2020 CLINICAL DATA:  Feeding tube placement EXAM: PORTABLE ABDOMEN - 1 VIEW COMPARISON:  None. FINDINGS: Enteric tube passes into  the stomach with tip likely within the fundus obscured by LVAD. Bowel gas pattern is unremarkable. IMPRESSION:  Enteric tube within the fundus of the stomach. Electronically Signed   By: Macy Mis M.D.   On: 10/17/2020 14:13   DG Abd Portable 1V  Result Date: 10/17/2020 CLINICAL DATA:  Feeding tube placement. EXAM: PORTABLE ABDOMEN - 1 VIEW COMPARISON:  None. FINDINGS: The bowel gas pattern is normal. There appears to be a feeding tube in the proximal stomach, although distal tip is obscured due to overlying left ventricular assist device. No radio-opaque calculi or other significant radiographic abnormality are seen. IMPRESSION: No evidence of bowel obstruction or ileus. Distal tip of feeding tube is obscured due to overlying left ventricular assist device. Electronically Signed   By: Marijo Conception M.D.   On: 10/17/2020 14:12   DG Abd Portable 1V  Result Date: 10/17/2020 CLINICAL DATA:  Feeding tube placement EXAM: PORTABLE ABDOMEN - 1 VIEW COMPARISON:  Portable exam 1350 hours compared to 1345 hours FINDINGS: Tip of feeding tube projects over the region of the pylorus. LVAD and pacemaker with leads present. Bowel gas pattern normal. Epicardial pacing wires present. Visualized bowel gas pattern normal. IMPRESSION: Tip of feeding tube projects over pylorus. Electronically Signed   By: Lavonia Dana M.D.   On: 10/17/2020 14:11   ECHOCARDIOGRAM LIMITED  Result Date: 10/17/2020    ECHOCARDIOGRAM LIMITED REPORT   Patient Name:   Marcus Johnson. Date of Exam: 10/17/2020 Medical Rec #:  [id number]                Height:       70.5 in Accession #:    [id number]               Weight:       212.7 lb Date of Birth:  [birth year]                BSA:          2.154 m Patient Age:    59 years                 BP:           0/0 mmHg Patient Gender: M                        HR:           97 bpm. Exam Location:  Inpatient Procedure: Limited Echo, Limited Color Doppler and Cardiac Doppler Indications:    Congestive Heart Failure I50.9  History:        Patient has prior history of Echocardiogram examinations, most                  recent 10/13/2020. CHF and Cardiomyopathy, Previous Myocardial                 Infarction and CAD, Arrythmias:Atrial Fibrillation; Risk                 Factors:Diabetes and Hypertension.  Sonographer:    Bernadene Person RDCS Referring Phys: [id number] AMY D CLEGG  Sonographer Comments: Dr. Haroldine Laws present. IMPRESSIONS  1. Technically difficult echo.  2. The LV endocardium is not well visualized. There appears to be severe LV dysfunction . Marland Kitchen Left ventricular ejection fraction, by estimation, is 20 to 25%. The left ventricle has severely decreased function.  3. The right ventricular size is moderately enlarged.  4. Tricuspid valve regurgitation is mild  to moderate. FINDINGS  Left Ventricle: The LV endocardium is not well visualized. There appears to be severe LV dysfunction. Left ventricular ejection fraction, by estimation, is 20 to 25%. The left ventricle has severely decreased function. Right Ventricle: The right ventricular size is moderately enlarged. Tricuspid Valve: The tricuspid valve is grossly normal. Tricuspid valve regurgitation is mild to moderate. Pulmonic Valve: The pulmonic valve was grossly normal. Pulmonic valve regurgitation is trivial. Additional Comments: Technically difficult echo. A device lead is visualized. LEFT VENTRICLE PLAX 2D LVIDd:         3.00 cm  PULMONIC VALVE RVOT Peak grad: 3 mmHg  TRICUSPID VALVE TR Peak grad:   31.4 mmHg TR Vmax:        280.00 cm/s  SHUNTS Pulmonic VTI: 0.089 m Mertie Moores MD Electronically signed by Mertie Moores MD Signature Date/Time: 10/17/2020/12:02:03 PM    Final    [place] EKG SITE RITE  Result Date: 10/17/2020 If Site Rite image not attached, placement could not be confirmed due to current cardiac rhythm.   Labs: BMET Recent Labs  Lab 10/13/20 2112 10/13/20 2117 10/14/20 0254 10/14/20 0258 10/14/20 1655 10/14/20 1701 10/15/20 0410 10/16/20 0435 10/17/20 0346 10/17/20 0348 10/18/20 0409  NA 136   < > 136   < > 138 139 138 138 138 140 135   K 4.1   < > 4.7   < > 4.7 4.7 4.5 3.9 3.8 3.8 3.4*  CL 107  --  106  --  106  --  106 105 105  --  102  CO2 22  --  22  --  23  --  22 21* 21*  --  21*  GLUCOSE 133*  --  146*  --  112*  --  122* 159* 75  --  191*  BUN 25*  --  26*  --  28*  --  29* 38* 45*  --  52*  CREATININE 1.75*  --  1.84*  --  1.90*  --  2.08* 2.54* 3.00*  --  3.50*  CALCIUM 8.6*  --  8.5*  --  8.2*  --  8.2* 8.0* 8.1*  --  7.5*  PHOS  --   --  3.6  --   --   --  4.7* 4.6 4.7*  --  5.2*   < > = values in this interval not displayed.   CBC Recent Labs  Lab 10/15/20 0410 10/16/20 0435 10/17/20 0346 10/17/20 0348 10/18/20 0409  WBC 27.9* 29.4* 27.2*  --  17.9*  NEUTROABS 22.6* 24.4* 22.6*  --  14.7*  HGB 8.2* 7.3* 8.0* 8.5* 7.0*  HCT 25.5* 22.7* 23.8* 25.0* 22.3*  MCV 86.7 87.6 87.2  --  91.0  PLT 184 232 262  --  246    Medications:    . sodium chloride   Intravenous Once  . acetaminophen  1,000 mg Oral Q6H   Or  . acetaminophen (TYLENOL) oral liquid 160 mg/5 mL  1,000 mg Per Tube Q6H  . aspirin EC  325 mg Oral Daily   Or  . aspirin  324 mg Per Tube Daily   Or  . aspirin  300 mg Rectal Daily  . Chlorhexidine Gluconate Cloth  6 each Topical Daily  . docusate sodium  100 mg Oral Daily  . feeding supplement  237 mL Oral BID BM  . feeding supplement (PROSource TF)  45 mL Per Tube TID  . insulin aspart  0-15 Units Subcutaneous Q4H  .  insulin detemir  15 Units Subcutaneous BID  . lidocaine  1 patch Transdermal Q24H  . mouth rinse  15 mL Mouth Rinse BID  . metoCLOPramide (REGLAN) injection  5 mg Intravenous Q12H  . metolazone  2.5 mg Oral Once  . midodrine  10 mg Oral TID WC  . multivitamin with minerals  1 tablet Oral Daily  . pantoprazole  40 mg Oral Daily  . sodium chloride flush  10-40 mL Intracatheter Q12H  . sodium chloride flush  3 mL Intravenous Q12H  . warfarin  2 mg Oral q1600  . Warfarin - Physician Dosing Inpatient   Does not apply q1600   Elmarie Shiley, MD 10/18/2020, 7:32 AM

## 2020-10-18 NOTE — Progress Notes (Signed)
Physical Therapy Treatment Patient Details Name: Marcus Johnson. MRN: [id number] DOB: 05/24/1961 Today's Date: 10/18/2020    History of Present Illness 60 y.o. male admitted 2/28 with ICM for LVAD workup. Impella placed 3/1. Heartmate 3 LVAD placed 3/10. PMHx: DM2, CAD s/p CABG 6213, systolic HF due to ischemic cardiomyopathy with EF 20-25% (echo 12/15), DM2 and CKD. He is s/p Medtronic single chamber ICD. Covid 1/22    PT Comments    Pt pleasant able to recall staff but continues to struggle with recall of sternal precautions or names of LVAD parts. Pt with improved standing tolerance and transition to standing. Pt educated for all transfers, precautions and progression. Did not attempt OOB as pt needing CRRT access today. Will continue to follow.   HR 97-102 90-97%on 4L Art line 82/68 (75) Speed 5500, flow 5.2, PI 2.8, power 4.2    Follow Up Recommendations  Supervision/Assistance - 24 hour;CIR     Equipment Recommendations  Other (comment) (TBD)    Recommendations for Other Services       Precautions / Restrictions Precautions Precautions: Fall;Sternal Precaution Comments: LVAD, multiple lines Restrictions Weight Bearing Restrictions: Yes RUE Weight Bearing: Partial weight bearing    Mobility  Bed Mobility Overal bed mobility: Needs Assistance Bed Mobility: Rolling;Sidelying to Sit;Sit to Supine Rolling: Mod assist Sidelying to sit: Mod assist Supine to sit: Mod assist;+2 for physical assistance     General bed mobility comments: mod assist to roll to left and bring legs off bed then clear trunk. Return to bed with assist to bring legs to surface and limited assist to control trunk with +2 for lines and safety    Transfers Overall transfer level: Needs assistance   Transfers: Sit to/from Stand Sit to Stand: Mod assist;+2 physical assistance         General transfer comment: mod +2 assist x 4 trials with pad cradling sacrum for initial rise, Pt with  ability to achieve full standing grossly 10, 10, 40, 20 sec respectively. Pt then performed 3 lateral scoots with assist of pad toward HOB with mod +2 assist. Return to bed for CRRT access  Ambulation/Gait             General Gait Details: not yet able   Stairs             Wheelchair Mobility    Modified Rankin (Stroke Patients Only)       Balance Overall balance assessment: Needs assistance Sitting-balance support: No upper extremity supported;Feet supported Sitting balance-Leahy Scale: Poor Sitting balance - Comments: EOb with min-minguard assist with posterior bias Postural control: Posterior lean   Standing balance-Leahy Scale: Poor Standing balance comment: bil UE supported at axilla and knees blocked                            Cognition Arousal/Alertness: Awake/alert Behavior During Therapy: Flat affect Overall Cognitive Status: Impaired/Different from baseline Area of Impairment: Memory;Following commands;Problem solving                     Memory: Decreased short-term memory;Decreased recall of precautions Following Commands: Follows one step commands with increased time;Follows one step commands inconsistently     Problem Solving: Slow processing General Comments: pt with difficulty recalling all sternal precautions and LVAD equipment, slow to respond at times and unable to follow commands for anterior translation EOB      Exercises General Exercises - Lower Extremity Short Arc  Quad: AROM;Both;10 reps;Supine    General Comments        Pertinent Vitals/Pain Pain Score: 4  Pain Location: RUE Pain Descriptors / Indicators: Aching;Guarding;Sore Pain Intervention(s): Limited activity within patient's tolerance;Repositioned;Monitored during session    Home Living                      Prior Function            PT Goals (current goals can now be found in the care plan section) Progress towards PT goals: Progressing  toward goals    Frequency    Min 4X/week      PT Plan Current plan remains appropriate    Co-evaluation              AM-PAC PT "6 Clicks" Mobility   Outcome Measure  Help needed turning from your back to your side while in a flat bed without using bedrails?: A Lot Help needed moving from lying on your back to sitting on the side of a flat bed without using bedrails?: A Lot Help needed moving to and from a bed to a chair (including a wheelchair)?: Total Help needed standing up from a chair using your arms (e.g., wheelchair or bedside chair)?: Total Help needed to walk in hospital room?: Total Help needed climbing 3-5 steps with a railing? : Total 6 Click Score: 8    End of Session   Activity Tolerance: Patient tolerated treatment well Patient left: in bed;with call bell/phone within reach;with bed alarm set;with nursing/sitter in room Nurse Communication: Mobility status;Precautions PT Visit Diagnosis: Unsteadiness on feet (R26.81);Other abnormalities of gait and mobility (R26.89);Muscle weakness (generalized) (M62.81);Difficulty in walking, not elsewhere classified (R26.2)     Time: 2233-6122 PT Time Calculation (min) (ACUTE ONLY): 28 min  Charges:  $Therapeutic Activity: 23-37 mins                     Celedonio Sortino P, PT Acute Rehabilitation Services Pager: [phone] Office: [place] 10/18/2020, 10:18 AM

## 2020-10-18 NOTE — Progress Notes (Signed)
LVAD Coordinator Rounding Note:  Admitted 09/28/20 due to worsening heart failure symptoms. Recent COVID infection January 2022; did not take medications for 3-4 weeks.   S/p dental extractions 10/07/20 per Dr Benson Norway.  HM III LVAD implanted on 10/13/20 by Dr Cyndia Bent under Destination Therapy criteria.  Pt awake, recognizes VAD Coordinator. Brother and son at beside. Pt reports he is "fighting through" everything. Family here today to start VAD education. Pt wants them to know it will be "a while" before he goes home and that they have "plenty of time".   Dr. Tamala Julian in room to place dialysis catheter. He explained to patient and family with verbalized understanding and agreement to same. Family stepped out of room, will return when procedure completed.  Vital signs: Temp: 98.9 HR: 103 Arterial BP: 86/82 (84) O2 Sat: 95% on 5L/[place] Wt: 200.1>221.7>219.5>212.7>223>224.6 lbs     LVAD interrogation reveals:  Speed: 5500 Flow: 5.2 Power: 4.1 w PI: 2.5 Hematocrit: 22  Alarms: none Events: none  Fixed speed: 5500 Low speed limit: 5200  Drive Line: Existing VAD dressing removed and site care performed using sterile technique. Drive line exit site cleaned with Chlora prep applicators x 2, allowed to dry, and gauze dressing with silver strip re-applied. Exit site healing and unincorporated the velour is fully implanted at exit site. There is 1 suture visible that is wrapped around the driveline. Moderate amount of sanguineous drainage on gauze dressing. No redness, tenderness, foul odor or rash noted. Few inches of drive line from exit site to modular cable connection, making positioning/anchoring of drive line difficult. Drive line anchor correctly applied. Continue daily dressing changes by VAD coordinator or nurse champion. Next dressing change is due 10/19/20.  Labs:  LDH trend: 578>469>629  INR trend: 1.4>1.7>1.8  Creatinine: 1.84>3.0>3.4  AST/ALT: 138/29  Anticoagulation Plan: -INR  Goal: 2.0-2.5 -ASA Dose: 325 mg until INR therapeutic  Blood Products:  IntraOp:   1 Platelet  6 FFP  3 PRBC  1035 cell saver Postop:   10/13/20:   4 FFP  4 PRBC    10/14/20:  1 FFP  1 PRBC   10/16/20:  1 PRBC   10/18/20:  1 PRBC  Device: - Medtronic single -Therapies: off   Arrythmias: VT in OR requiring defibrillation   Drips: Epinephrine 6 mcg/min Levophed 10 mcg/min Vasopressin 0.02 units/hr Milrinone 0.25 mcg/kg/min Lasix 20 mg/hr Amiodarone 30 mg/hr  Patient Education: 1. Demonstrated dressing change with patient, son, and brother. Explained sterile technique and how to don sterile gloves. Practice pair given to son and brother for them both to work with at home; they verbalized agreement to same.   Plan/Recommendations:  1. Call VAD coordinator for equipment or drive line issues 2. Daily drive line dressing change per VAD coordinator or nurse champion. Next dressing change due 10/19/20  Zada Girt RN [place] Coordinator  Office: [phone]  24/7 Pager: [phone]

## 2020-10-18 NOTE — Procedures (Signed)
Central Venous Catheter Insertion Procedure Note  Marcus Johnson  [id number]  1961/07/04  Date:10/18/20  Time:10:15 AM   Provider Performing:Jelani Trueba Loletha Grayer Tamala Julian   Procedure: Insertion of Non-tunneled Central Venous Catheter(36556)with US guidance (79892)    Indication(s) Hemodialysis  Consent Risks of the procedure as well as the alternatives and risks of each were explained to the patient and/or caregiver.  Consent for the procedure was obtained and is signed in the bedside chart  Anesthesia Topical only with 1% lidocaine   Timeout Verified patient identification, verified procedure, site/side was marked, verified correct patient position, special equipment/implants available, medications/allergies/relevant history reviewed, required imaging and test results available.  Sterile Technique Maximal sterile technique including full sterile barrier drape, hand hygiene, sterile gown, sterile gloves, mask, hair covering, sterile ultrasound probe cover (if used).  Procedure Description Area of catheter insertion was cleaned with chlorhexidine and draped in sterile fashion.   With real-time ultrasound guidance a HD catheter was placed into the right internal jugular vein.  Nonpulsatile blood flow and easy flushing noted in all ports.  The catheter was sutured in place and sterile dressing applied.  Complications/Tolerance None; patient tolerated the procedure well. Chest X-ray is ordered to verify placement for internal jugular or subclavian cannulation.  Chest x-ray is not ordered for femoral cannulation.  EBL Minimal  Specimen(s) None

## 2020-10-18 NOTE — Progress Notes (Signed)
CSW met with patient at bedside. Patient stated "I really didn't expect it to be all this...somehting new everyday". Patient then stated "this is why I fought so hard to avoid this". CSW provided supportive intervention and encouragement to find the fight he used in the past to help him through recovery. Patient stated he is taking it one minute at a time and feels very supported by the team. CSW will continue to follow for support and any other needs as they arise during the hospitalization. Raquel Sarna, [place], [place] [address]

## 2020-10-18 NOTE — Progress Notes (Signed)
Attempted x2 to get patient out of bed on to scale to obtain weight this morning. Despite having the help of 3 RN, patient was unable to sit on edge on of bed without knees buckling underneath him.  Due to safety concerns nurses assisted patient back into bed.

## 2020-10-18 NOTE — Progress Notes (Addendum)
Patient ID: Marcus Radi., male   DOB: 1961/05/15, 60 y.o.   MRN: [id number]      Advanced Heart Failure Rounding Note  PCP-Cardiologist: No primary care provider on file.   Subjective:    - 2/27 Milrinone switched to dobutamine 5 mcg.  - 2/28 Moved to ICU. Dobutamine increased to 7.5 mcg and Norepi 3 mcg added. Diuretics held.  - 3/1 Impella 5.5 placed.  - 3/4 Teeth removed - 3/10 HM3 LVAD placed -3/13 VAD speed increased to 5500. [name] out  - 3/14 Nephrology consulted. Given 1UPRBCs   On milrinone 0.25, NE 8, epi 8, vaso 0.02 . CO-OX 74%   Diuresing with lasix drip at 20 mg per hour. Urine output trending down ~20 cc per hour.  Hgb 7 today.  Creatinine 2.08 -> 2.54>3.0>3.5  . WBC 28-> 29>27.2->17.9 K  LDH 293>444>597>593>670>575   CVP 18-19   Complaining of fatigue.   Echo: EF 20-25%, normal RV, PASP 61, dilated IVC.   LVAD Interrogation HM 3: Speed: 6269 Flow:5.3  PI: 2.3  Power: 4 VAD interrogated personally. Parameters stable.  Objective:   Weight Range: 101.2 kg Body mass index is 31.56 kg/m.   Vital Signs:   Temp:  [98.1 F (36.7 C)-98.78 F (37.1 C)] 98.3 F (36.8 C) (03/15 0400) Pulse Rate:  [57-115] 100 (03/15 0630) Resp:  [4-28] 16 (03/15 0630) BP: (76-83)/(52-73) 76/52 (03/15 0445) SpO2:  [89 %-100 %] 94 % (03/15 0630) Arterial Line BP: (71-99)/(58-88) 71/58 (03/15 0630) Weight:  [101.2 kg] 101.2 kg (03/15 0553) Last BM Date: 10/12/20  Weight change: Filed Weights   10/16/20 0600 10/17/20 0500 10/18/20 0553  Weight: 99.6 kg 96.5 kg 101.2 kg    Intake/Output:   Intake/Output Summary (Last 24 hours) at 10/18/2020 0725 Last data filed at 10/18/2020 0700 Gross per 24 hour  Intake 2029.61 ml  Output 825 ml  Net 1204.61 ml      Physical Exam  CVP 18-19  Physical Exam: GENERAL: No acute distress. HEENT: normal . +Cortrak.  NECK: Supple, JVP to jaw  .  2+ bilaterally, no bruits.  No lymphadenopathy or thyromegaly appreciated.    CARDIAC:  Mechanical heart sounds with LVAD hum present.  LUNGS:  Clear to auscultation bilaterally.  ABDOMEN:  Soft, round, nontender, positive bowel sounds x4.     LVAD exit site:  Dressing dry and intact.  No erythema or drainage.  Stabilization device present and accurately applied.  EXTREMITIES:  Warm and dry, no cyanosis, clubbing, rash . R and LLE 2+  edema . LUE art line. RUE PICC NEUROLOGIC:  Alert and oriented x 3.    No aphasia.  No dysarthria.  Affect flat.     Telemetry   A fib with PVC 90-100s   Labs    CBC Recent Labs    10/17/20 0346 10/17/20 0348 10/18/20 0409  WBC 27.2*  --  17.9*  NEUTROABS 22.6*  --  14.7*  HGB 8.0* 8.5* 7.0*  HCT 23.8* 25.0* 22.3*  MCV 87.2  --  91.0  PLT 262  --  485   Basic Metabolic Panel Recent Labs    10/17/20 0346 10/17/20 0348 10/18/20 0409  NA 138 140 135  K 3.8 3.8 3.4*  CL 105  --  102  CO2 21*  --  21*  GLUCOSE 75  --  191*  BUN 45*  --  52*  CREATININE 3.00*  --  3.50*  CALCIUM 8.1*  --  7.5*  MG 2.6*  --  2.4  PHOS 4.7*  --  5.2*   Liver Function Tests Recent Labs    10/16/20 0435  AST 145*  ALT 51*  ALKPHOS 82  BILITOT 3.1*  PROT 6.2*  ALBUMIN 2.4*   No results for input(s): LIPASE, AMYLASE in the last 72 hours. Cardiac Enzymes No results for input(s): CKTOTAL, CKMB, CKMBINDEX, TROPONINI in the last 72 hours.  BNP: BNP (last 3 results) Recent Labs    10/14/20 0254  BNP 1,097.8*    ProBNP (last 3 results) No results for input(s): PROBNP in the last 8760 hours.   D-Dimer No results for input(s): DDIMER in the last 72 hours. Hemoglobin A1C No results for input(s): HGBA1C in the last 72 hours. Fasting Lipid Panel No results for input(s): CHOL, HDL, LDLCALC, TRIG, CHOLHDL, LDLDIRECT in the last 72 hours. Thyroid Function Tests No results for input(s): TSH, T4TOTAL, T3FREE, THYROIDAB in the last 72 hours.  Invalid input(s): FREET3  Other results:   Imaging    US RENAL  Result  Date: 10/17/2020 CLINICAL DATA:  Acute on chronic renal insufficiency EXAM: RENAL / URINARY TRACT ULTRASOUND COMPLETE COMPARISON:  09/30/2020 FINDINGS: Right Kidney: Renal measurements: 13.1 x 5.7 x 7.2 cm = volume: 282.3 mL. Echogenicity within normal limits. No mass or hydronephrosis visualized. Left Kidney: Renal measurements: 12.7 x 5.8 x 6.7 cm = volume: 255.8 mL. Echogenicity within normal limits. No mass or hydronephrosis visualized. Bladder: Bladder is decompressed with a Foley catheter. Other: None. IMPRESSION: 1. Unremarkable renal ultrasound. Electronically Signed   By: Randa Ngo M.D.   On: 10/17/2020 22:25   DG Chest Port 1 View  Result Date: 10/17/2020 CLINICAL DATA:  Status post right-sided PICC line placement. EXAM: PORTABLE CHEST 1 VIEW COMPARISON:  October 16, 2020 (5:29 a.m.) FINDINGS: A single lead ventricular pacer is noted. Multiple sternal wires and vascular clips are seen. There is stable Swan-Ganz catheter and nasogastric tube positioning. Interval PICC line placement is noted with its distal end seen at the junction of the superior vena cava and right atrium. Mild atelectasis and/or infiltrate is seen within the left lung base. There is no evidence of a pleural effusion or pneumothorax. Mild to moderate severity enlargement of the cardiac silhouette is noted. The visualized skeletal structures are unremarkable. IMPRESSION: 1. Interval PICC line placement and positioning, as described above. 2. No additional significant interval changes when compared to the prior exam. Electronically Signed   By: Virgina Norfolk M.D.   On: 10/17/2020 15:34   DG Abd Portable 1V  Result Date: 10/17/2020 CLINICAL DATA:  Feeding tube placement EXAM: PORTABLE ABDOMEN - 1 VIEW COMPARISON:  None. FINDINGS: Enteric tube passes into the stomach with tip likely within the fundus obscured by LVAD. Bowel gas pattern is unremarkable. IMPRESSION: Enteric tube within the fundus of the stomach. Electronically  Signed   By: Macy Mis M.D.   On: 10/17/2020 14:13   DG Abd Portable 1V  Result Date: 10/17/2020 CLINICAL DATA:  Feeding tube placement. EXAM: PORTABLE ABDOMEN - 1 VIEW COMPARISON:  None. FINDINGS: The bowel gas pattern is normal. There appears to be a feeding tube in the proximal stomach, although distal tip is obscured due to overlying left ventricular assist device. No radio-opaque calculi or other significant radiographic abnormality are seen. IMPRESSION: No evidence of bowel obstruction or ileus. Distal tip of feeding tube is obscured due to overlying left ventricular assist device. Electronically Signed   By: Marijo Conception M.D.   On: 10/17/2020 14:12  DG Abd Portable 1V  Result Date: 10/17/2020 CLINICAL DATA:  Feeding tube placement EXAM: PORTABLE ABDOMEN - 1 VIEW COMPARISON:  Portable exam 1350 hours compared to 1345 hours FINDINGS: Tip of feeding tube projects over the region of the pylorus. LVAD and pacemaker with leads present. Bowel gas pattern normal. Epicardial pacing wires present. Visualized bowel gas pattern normal. IMPRESSION: Tip of feeding tube projects over pylorus. Electronically Signed   By: Lavonia Dana M.D.   On: 10/17/2020 14:11   ECHOCARDIOGRAM LIMITED  Result Date: 10/17/2020    ECHOCARDIOGRAM LIMITED REPORT   Patient Name:   Marcus Johnson. Date of Exam: 10/17/2020 Medical Rec #:  [id number]                Height:       70.5 in Accession #:    [id number]               Weight:       212.7 lb Date of Birth:  August 17, 1960                BSA:          2.154 m Patient Age:    64 years                 BP:           0/0 mmHg Patient Gender: M                        HR:           97 bpm. Exam Location:  Inpatient Procedure: Limited Echo, Limited Color Doppler and Cardiac Doppler Indications:    Congestive Heart Failure I50.9  History:        Patient has prior history of Echocardiogram examinations, most                 recent 10/13/2020. CHF and Cardiomyopathy, Previous  Myocardial                 Infarction and CAD, Arrythmias:Atrial Fibrillation; Risk                 Factors:Diabetes and Hypertension.  Sonographer:    Bernadene Person RDCS Referring Phys: [id number] AMY D CLEGG  Sonographer Comments: Dr. Haroldine Laws present. IMPRESSIONS  1. Technically difficult echo.  2. The LV endocardium is not well visualized. There appears to be severe LV dysfunction . Marland Kitchen Left ventricular ejection fraction, by estimation, is 20 to 25%. The left ventricle has severely decreased function.  3. The right ventricular size is moderately enlarged.  4. Tricuspid valve regurgitation is mild to moderate. FINDINGS  Left Ventricle: The LV endocardium is not well visualized. There appears to be severe LV dysfunction. Left ventricular ejection fraction, by estimation, is 20 to 25%. The left ventricle has severely decreased function. Right Ventricle: The right ventricular size is moderately enlarged. Tricuspid Valve: The tricuspid valve is grossly normal. Tricuspid valve regurgitation is mild to moderate. Pulmonic Valve: The pulmonic valve was grossly normal. Pulmonic valve regurgitation is trivial. Additional Comments: Technically difficult echo. A device lead is visualized. LEFT VENTRICLE PLAX 2D LVIDd:         3.00 cm  PULMONIC VALVE RVOT Peak grad: 3 mmHg  TRICUSPID VALVE TR Peak grad:   31.4 mmHg TR Vmax:        280.00 cm/s  SHUNTS Pulmonic VTI: 0.089 m Mertie Moores MD Electronically signed by Mertie Moores MD Signature  Date/Time: 10/17/2020/12:02:03 PM    Final    [place] EKG SITE RITE  Result Date: 10/17/2020 If Site Rite image not attached, placement could not be confirmed due to current cardiac rhythm.    Medications:     Scheduled Medications: . sodium chloride   Intravenous Once  . acetaminophen  1,000 mg Oral Q6H   Or  . acetaminophen (TYLENOL) oral liquid 160 mg/5 mL  1,000 mg Per Tube Q6H  . aspirin EC  325 mg Oral Daily   Or  . aspirin  324 mg Per Tube Daily   Or  . aspirin  300 mg  Rectal Daily  . Chlorhexidine Gluconate Cloth  6 each Topical Daily  . docusate sodium  100 mg Oral Daily  . feeding supplement  237 mL Oral BID BM  . feeding supplement (PROSource TF)  45 mL Per Tube TID  . insulin aspart  0-15 Units Subcutaneous Q4H  . insulin detemir  15 Units Subcutaneous BID  . lidocaine  1 patch Transdermal Q24H  . mouth rinse  15 mL Mouth Rinse BID  . metoCLOPramide (REGLAN) injection  5 mg Intravenous Q12H  . metolazone  2.5 mg Oral Once  . midodrine  10 mg Oral TID WC  . multivitamin with minerals  1 tablet Oral Daily  . pantoprazole  40 mg Oral Daily  . sodium chloride flush  10-40 mL Intracatheter Q12H  . sodium chloride flush  3 mL Intravenous Q12H  . warfarin  2 mg Oral q1600  . Warfarin - Physician Dosing Inpatient   Does not apply q1600    Infusions: . sodium chloride Stopped (10/16/20 1426)  . sodium chloride    . sodium chloride 20 mL/hr at 10/17/20 1715  . amiodarone 30 mg/hr (10/18/20 0402)  . epinephrine 6 mcg/min (10/18/20 0217)  . feeding supplement (VITAL 1.5 CAL) 1,000 mL (10/17/20 1743)  . furosemide (LASIX) 200 mg in dextrose 5% 100 mL (2mg /mL) infusion 20 mg/hr (10/18/20 0700)  . lactated ringers    . lactated ringers Stopped (10/17/20 1655)  . milrinone 0.25 mcg/kg/min (10/18/20 0700)  . norepinephrine (LEVOPHED) Adult infusion 10 mcg/min (10/17/20 1713)  . vasopressin 0.02 Units/min (10/18/20 0700)    PRN Medications: sodium chloride, dextrose, morphine injection, ondansetron (ZOFRAN) IV, oxyCODONE, sodium chloride flush, sodium chloride flush, traMADol    Assessment/Plan   1.Acute on chronic systolic HF -> cardiogenic shock:ICM.HasMedtronic ICD.EF 15%. Low output persisted despite milrinone and then dobutamine with rise in creatinine to 2.5. Impella 5.5 placed 3/1.  Creatinine improved to 1.7 and HM3 LVAD was placed on 3/10.  -Patient currently on milrinone 0.25, NE 8, epi 8, vaso 0.02. CO-OX  74% - Maps low. Cut back  milrinone to 0.125 mcg. May need to stop. Continue midodrine 10 mg tid.  -Marked volume overload despite high dose lasix. Stop lasix drip and metolazone.  - Starting CRRT today.  2. LVAD - 3/13 VAD speed increased to 5500. - ASA 325 until INR up to 2. INR 1.8 today.  - LDH trending up  293>444>597>593>670>575  - Dosing  anticoagulation per CT surgery.   - DL site ok 3. CAD s/p [id number]:Last cath in 6/19 with patent grafts.  - Continue Crestor 40 mg daily.  4. AKI on CKD Stage 3b: Suspect that this is a combination of cardiorenal syndrome and diabetic nephropathy and possibly ATN.  - creatinine trending back up post-op 2.1-> 2.5>3>3.5 . Urine output ~20 cc per hour.  Will continue hemodynamic support.  -  Nephrology appreciated. Discussed with Dr Bartle/Dr Posey Pronto at bedside. .  -Planning to start CRRT today. Will need HD catheter.  5. Atrial flutter/fibrillation: s/p DC-CV 08/21/18.He has been in rate-controlled atrial fibrillation.  -Continue amio drip 30 mg per hour.  - Will eventually resume anticoagulation.  6. Wound R Foot: Partial thickness skin loss noted. Cover with mepilex border and change every 5 days.  7. Right arm pain: Think neuropathic, per Dr. Cyndia Bent patient had stretch of brachial plexus with Impella placement.  8. ID: WBC 17.9  Afebrile, negative cultures. Follow closely  9. YC:XKGYJEH of very poor control but hgbA1ctrending down. Most recent 7.7%  - Cover with SSI. 10. Anemia  Hgb 7 today. Give 1UPRBC. No obvious source.   Length of Stay: [place], NP  10/18/2020, 7:25 AM  Advanced Heart Failure Team Pager [phone] (M-F; [place])  Please contact [facility] for night-coverage after hours (4p -7a ) and weekends on [email or web address]  Agree.  Remains on milrinone 0.25, NE 8, epi 8, vaso 0.02 . CO-OX 74% Overnight diuresed modestly with lasix drip but volume overload progressing. Weight up 1 pound. Feels tight and bloated. CVVHD started today,  R arm still  hurts. Very weak  General:  NAD.  HEENT: normal  Neck: supple. JVP  To jaw. RIJ HD cath.  Carotids 2+ bilat; no bruits. No lymphadenopathy or thryomegaly appreciated. Cor: LVAD hum.  Sternal wound ok Lungs: Clear. Abdomen: obese soft, nontender, + distended.  No bruits or masses. Good bowel sounds. Driveline site clean. Anchor in place.  Extremities: no cyanosis, clubbing, rash. Warm 3+ edema  Neuro: alert & oriented x 3. No focal deficits. Moves all 4 without problem   He continues to struggle. Remains on pressors. CVVHD initiated today for ongoing volume overload. Co-ox ok. VAD interrogated personally. Parameters stable. On warfarin. INR 1.8  CRITICAL CARE Performed by: Glori Bickers  Total critical care time: 35 minutes  Critical care time was exclusive of separately billable procedures and treating other patients.  Critical care was necessary to treat or prevent imminent or life-threatening deterioration.  Critical care was time spent personally by me (independent of midlevel providers or residents) on the following activities: development of treatment plan with patient and/or surrogate as well as nursing, discussions with consultants, evaluation of patient's response to treatment, examination of patient, obtaining history from patient or surrogate, ordering and performing treatments and interventions, ordering and review of laboratory studies, ordering and review of radiographic studies, pulse oximetry and re-evaluation of patient's condition.   Glori Bickers, MD  10:31 PM

## 2020-10-18 NOTE — Progress Notes (Signed)
Patient ID: Marcus Bur., male   DOB: 1961-01-07, 60 y.o.   MRN: [id number] HeartMate 2 Rounding Note  Subjective:    Hemodynamics remain stable on  Milrinone 0.25, epi 6, NE 8. Vasopressin weaned off this evening with MAP in 90's  Co-ox 74 this am.  Started on CRRT today and removing 200 cc/hr.  Urine output 20/hr on lasix 20 with creat rise to 3.5 this am.  He is depressed due to need for CRRT, feeding tube. Fatigued and could not dangle today.      LVAD INTERROGATION:  HeartMate II LVAD:  Flow 5 liters/min, speed 5500, power 4, PI 3.4.    Objective:    Vital Signs:   Temp:  [97.7 F (36.5 C)-98.9 F (37.2 C)] 97.7 F (36.5 C) (03/15 1600) Pulse Rate:  [57-115] 96 (03/15 1715) Resp:  [4-28] 14 (03/15 1715) BP: (76-110)/(49-85) 110/74 (03/15 1615) SpO2:  [89 %-100 %] 96 % (03/15 1715) Arterial Line BP: (71-103)/(58-89) 97/77 (03/15 1715) Weight:  [101.2 kg-101.9 kg] 101.9 kg (03/15 0800) Last BM Date: 10/12/20 Mean arterial Pressure 92  Intake/Output:   Intake/Output Summary (Last 24 hours) at 10/18/2020 1738 Last data filed at 10/18/2020 1700 Gross per 24 hour  Intake 3121.17 ml  Output 2151 ml  Net 970.17 ml     Physical Exam: General:  Fatigued and looks uncomfortable. No resp difficulty HEENT: normal Cor: distantheart sounds with LVAD hum present. Lungs: clear Abdomen: soft, nontender, nondistended. Good bowel sounds. Extremities: anasarca  Neuro: alert & orientedx3, cranial nerves grossly intact. moves all 4 extremities w/o difficulty. Affect pleasant  Telemetry: sinus 90's  Labs: Basic Metabolic Panel: Recent Labs  Lab 10/14/20 1655 10/14/20 1701 10/15/20 0410 10/16/20 0435 10/17/20 0346 10/17/20 0348 10/18/20 0409 10/18/20 1538  NA 138   < > 138 138 138 140 135 137  K 4.7   < > 4.5 3.9 3.8 3.8 3.4* 3.5  CL 106  --  106 105 105  --  102 104  CO2 23  --  22 21* 21*  --  21* 20*  GLUCOSE 112*  --  122* 159* 75  --  191* 170*  BUN  28*  --  29* 38* 45*  --  52* 47*  CREATININE 1.90*  --  2.08* 2.54* 3.00*  --  3.50* 3.27*  CALCIUM 8.2*  --  8.2* 8.0* 8.1*  --  7.5* 8.2*  MG 2.5*  --  2.4 2.4 2.6*  --  2.4  --   PHOS  --   --  4.7* 4.6 4.7*  --  5.2* 4.7*   < > = values in this interval not displayed.    Liver Function Tests: Recent Labs  Lab 10/12/20 1826 10/14/20 0254 10/15/20 0410 10/16/20 0435 10/18/20 1538  AST 31 138* 188* 145*  --   ALT 17 29 38 51*  --   ALKPHOS 136* 56 71 82  --   BILITOT 1.3* 3.5* 3.5* 3.1*  --   PROT 6.7 5.5* 6.0* 6.2*  --   ALBUMIN 2.5* 3.1* 2.6* 2.4* 2.3*   No results for input(s): LIPASE, AMYLASE in the last 168 hours. No results for input(s): AMMONIA in the last 168 hours.  CBC: Recent Labs  Lab 10/14/20 0254 10/14/20 0258 10/15/20 0410 10/16/20 0435 10/17/20 0346 10/17/20 0348 10/18/20 0409 10/18/20 1538  WBC 19.7*   < > 27.9* 29.4* 27.2*  --  17.9* 17.6*  NEUTROABS 14.7*  --  22.6* 24.4* 22.6*  --  14.7*  --   HGB 8.2*   < > 8.2* 7.3* 8.0* 8.5* 7.0* 8.1*  HCT 25.0*   < > 25.5* 22.7* 23.8* 25.0* 22.3* 26.0*  MCV 84.5   < > 86.7 87.6 87.2  --  91.0 90.0  PLT 119*   < > 184 232 262  --  246 258   < > = values in this interval not displayed.    INR: Recent Labs  Lab 10/14/20 0254 10/15/20 0410 10/16/20 0435 10/17/20 0346 10/18/20 0409  INR 1.4* 1.6* 1.7* 1.7* 1.8*    Other results:  EKG:   Imaging: US RENAL  Result Date: 10/17/2020 CLINICAL DATA:  Acute on chronic renal insufficiency EXAM: RENAL / URINARY TRACT ULTRASOUND COMPLETE COMPARISON:  09/30/2020 FINDINGS: Right Kidney: Renal measurements: 13.1 x 5.7 x 7.2 cm = volume: 282.3 mL. Echogenicity within normal limits. No mass or hydronephrosis visualized. Left Kidney: Renal measurements: 12.7 x 5.8 x 6.7 cm = volume: 255.8 mL. Echogenicity within normal limits. No mass or hydronephrosis visualized. Bladder: Bladder is decompressed with a Foley catheter. Other: None. IMPRESSION: 1. Unremarkable renal  ultrasound. Electronically Signed   By: Randa Ngo M.D.   On: 10/17/2020 22:25   DG CHEST PORT 1 VIEW  Result Date: 10/18/2020 CLINICAL DATA:  Central line placement EXAM: PORTABLE CHEST 1 VIEW COMPARISON:  Earlier today FINDINGS: Dialysis catheter is in place on the right with tip near the upper cavoatrial junction. The enteric tube reaches the stomach. Stable defibrillator lead placement. LVAD. Stable vascular congestion with asymmetric hazy density on the left. No right apical pneumothorax or new mediastinal widening. Cardiomegaly. IMPRESSION: New dialysis catheter without complicating feature. Electronically Signed   By: Monte Fantasia M.D.   On: 10/18/2020 10:14   DG Chest Port 1 View  Result Date: 10/18/2020 CLINICAL DATA:  Sore chest.  LVAD EXAM: PORTABLE CHEST 1 VIEW COMPARISON:  Yesterday FINDINGS: Feeding tube which at least reaches the stomach. LVAD in place. Dual-chamber ICD leads from the left. Right PICC in place. Prior CABG.  Stable cardiomegaly. Vascular congestion and asymmetric left-sided opacity which is likely atelectasis and pleural fluid. No pneumothorax. IMPRESSION: 1. Unremarkable hardware positioning. 2. Vascular congestion and asymmetric left-sided density likely reflecting atelectasis and pleural fluid. Electronically Signed   By: Monte Fantasia M.D.   On: 10/18/2020 08:02   DG Chest Port 1 View  Result Date: 10/17/2020 CLINICAL DATA:  Status post right-sided PICC line placement. EXAM: PORTABLE CHEST 1 VIEW COMPARISON:  October 16, 2020 (5:29 a.m.) FINDINGS: A single lead ventricular pacer is noted. Multiple sternal wires and vascular clips are seen. There is stable Swan-Ganz catheter and nasogastric tube positioning. Interval PICC line placement is noted with its distal end seen at the junction of the superior vena cava and right atrium. Mild atelectasis and/or infiltrate is seen within the left lung base. There is no evidence of a pleural effusion or pneumothorax. Mild to  moderate severity enlargement of the cardiac silhouette is noted. The visualized skeletal structures are unremarkable. IMPRESSION: 1. Interval PICC line placement and positioning, as described above. 2. No additional significant interval changes when compared to the prior exam. Electronically Signed   By: Virgina Norfolk M.D.   On: 10/17/2020 15:34   DG Abd Portable 1V  Result Date: 10/17/2020 CLINICAL DATA:  Feeding tube placement EXAM: PORTABLE ABDOMEN - 1 VIEW COMPARISON:  None. FINDINGS: Enteric tube passes into the stomach with tip likely within the fundus obscured by LVAD. Bowel gas pattern is  unremarkable. IMPRESSION: Enteric tube within the fundus of the stomach. Electronically Signed   By: Macy Mis M.D.   On: 10/17/2020 14:13   DG Abd Portable 1V  Result Date: 10/17/2020 CLINICAL DATA:  Feeding tube placement. EXAM: PORTABLE ABDOMEN - 1 VIEW COMPARISON:  None. FINDINGS: The bowel gas pattern is normal. There appears to be a feeding tube in the proximal stomach, although distal tip is obscured due to overlying left ventricular assist device. No radio-opaque calculi or other significant radiographic abnormality are seen. IMPRESSION: No evidence of bowel obstruction or ileus. Distal tip of feeding tube is obscured due to overlying left ventricular assist device. Electronically Signed   By: Marijo Conception M.D.   On: 10/17/2020 14:12   DG Abd Portable 1V  Result Date: 10/17/2020 CLINICAL DATA:  Feeding tube placement EXAM: PORTABLE ABDOMEN - 1 VIEW COMPARISON:  Portable exam 1350 hours compared to 1345 hours FINDINGS: Tip of feeding tube projects over the region of the pylorus. LVAD and pacemaker with leads present. Bowel gas pattern normal. Epicardial pacing wires present. Visualized bowel gas pattern normal. IMPRESSION: Tip of feeding tube projects over pylorus. Electronically Signed   By: Lavonia Dana M.D.   On: 10/17/2020 14:11   ECHOCARDIOGRAM LIMITED  Result Date: 10/17/2020     ECHOCARDIOGRAM LIMITED REPORT   Patient Name:   Marcus Nadeem. Date of Exam: 10/17/2020 Medical Rec #:  [id number]                Height:       70.5 in Accession #:    [id number]               Weight:       212.7 lb Date of Birth:  10/24/1960                BSA:          2.154 m Patient Age:    14 years                 BP:           0/0 mmHg Patient Gender: M                        HR:           97 bpm. Exam Location:  Inpatient Procedure: Limited Echo, Limited Color Doppler and Cardiac Doppler Indications:    Congestive Heart Failure I50.9  History:        Patient has prior history of Echocardiogram examinations, most                 recent 10/13/2020. CHF and Cardiomyopathy, Previous Myocardial                 Infarction and CAD, Arrythmias:Atrial Fibrillation; Risk                 Factors:Diabetes and Hypertension.  Sonographer:    Bernadene Person RDCS Referring Phys: [id number] AMY D CLEGG  Sonographer Comments: Dr. Haroldine Laws present. IMPRESSIONS  1. Technically difficult echo.  2. The LV endocardium is not well visualized. There appears to be severe LV dysfunction . Marland Kitchen Left ventricular ejection fraction, by estimation, is 20 to 25%. The left ventricle has severely decreased function.  3. The right ventricular size is moderately enlarged.  4. Tricuspid valve regurgitation is mild to moderate. FINDINGS  Left Ventricle: The LV endocardium is not well visualized. There appears to  be severe LV dysfunction. Left ventricular ejection fraction, by estimation, is 20 to 25%. The left ventricle has severely decreased function. Right Ventricle: The right ventricular size is moderately enlarged. Tricuspid Valve: The tricuspid valve is grossly normal. Tricuspid valve regurgitation is mild to moderate. Pulmonic Valve: The pulmonic valve was grossly normal. Pulmonic valve regurgitation is trivial. Additional Comments: Technically difficult echo. A device lead is visualized. LEFT VENTRICLE PLAX 2D LVIDd:         3.00 cm   PULMONIC VALVE RVOT Peak grad: 3 mmHg  TRICUSPID VALVE TR Peak grad:   31.4 mmHg TR Vmax:        280.00 cm/s  SHUNTS Pulmonic VTI: 0.089 m Mertie Moores MD Electronically signed by Mertie Moores MD Signature Date/Time: 10/17/2020/12:02:03 PM    Final    [place] EKG SITE RITE  Result Date: 10/17/2020 If Site Rite image not attached, placement could not be confirmed due to current cardiac rhythm.     Medications:     Scheduled Medications:  sodium chloride   Intravenous Once   acetaminophen  1,000 mg Oral Q6H   Or   acetaminophen (TYLENOL) oral liquid 160 mg/5 mL  1,000 mg Per Tube Q6H   aspirin EC  325 mg Oral Daily   Or   aspirin  324 mg Per Tube Daily   Or   aspirin  300 mg Rectal Daily   Chlorhexidine Gluconate Cloth  6 each Topical Daily   docusate sodium  100 mg Oral Daily   feeding supplement  237 mL Oral BID BM   feeding supplement (PROSource TF)  45 mL Per Tube TID   insulin aspart  0-15 Units Subcutaneous Q4H   insulin detemir  15 Units Subcutaneous BID   lidocaine  1 patch Transdermal Q24H   mouth rinse  15 mL Mouth Rinse BID   metoCLOPramide (REGLAN) injection  5 mg Intravenous Q12H   midodrine  10 mg Oral TID WC   multivitamin with minerals  1 tablet Oral Daily   pantoprazole  40 mg Oral Daily   polyethylene glycol  17 g Oral Daily   sodium chloride flush  10-40 mL Intracatheter Q12H   sodium chloride flush  3 mL Intravenous Q12H   warfarin  2 mg Oral q1600   Warfarin - Physician Dosing Inpatient   Does not apply q1600     Infusions:   prismasol BGK 4/2.5 400 mL/hr at 10/18/20 1052    prismasol BGK 4/2.5 200 mL/hr at 10/18/20 1052   sodium chloride Stopped (10/16/20 1426)   sodium chloride     sodium chloride 20 mL/hr at 10/17/20 1715   amiodarone 30 mg/hr (10/18/20 1645)   epinephrine 6 mcg/min (10/18/20 1259)   feeding supplement (VITAL 1.5 CAL) 30 mL/hr at 10/18/20 1400   lactated ringers     lactated ringers Stopped  (10/17/20 1655)   milrinone 0.125 mcg/kg/min (10/18/20 1700)   norepinephrine (LEVOPHED) Adult infusion 10 mcg/min (10/17/20 1713)   prismasol BGK 4/2.5 1,500 mL/hr at 10/18/20 1710   vasopressin Stopped (10/18/20 1644)     PRN Medications:  sodium chloride, dextrose, heparin, heparin, morphine injection, ondansetron (ZOFRAN) IV, oxyCODONE, sodium chloride flush, sodium chloride flush, traMADol, white petrolatum   Assessment:   1. Acute on chronic systolic congestive heart failure due to ischemic cardiomyopathy with ejection fraction of 20 to 25% admitted with class IIIb symptoms. POD 5 HM3 LVAD.  2. CAD s/p CABG 2002 with patent grafts on last cath.  3. Stage 3  CKD due to diabetic nephropathy and cardiorenal syndrome. Baseline recently 1.8. creat up to 3.0 today which is not unexpected with pump run, diuresis. Continue to maintain adequate MAP and expect that to improve with time.  4. Atrial fib/flutter: Sinus alternating with AF on amio IV.  5. Right brachial plexus stretch from preop Impella and hematoma around the axillary artery. This should improve with time.  6. Expected acute postop blood loss anemia. His Hgb dropped further to 7.0 this am due to dilution, blood draws. Transfused 1 unit. 7. Nurse reports a couple blisters on buttocks probably due to edema and pressure. Turn frequently and observe.  8. DM: glucose under fair control on Levemir and SSI.   Plan/Discussion:    He remains hemodynamically stable with rising MAP despite starting CRRT today. Vasopressin is off and may be able to wean NE, epi some.  I think it is important to get volume off at this point to maintain resp status, start mobility, prevent skin breakdown etc. Hopefully native renal function will improve with time.  INR slowly trending up. Continue Coumadin 2 mg daily.  Tube feeds to maintain nutrition.   Length of Stay: [address]  Gaye Pollack 10/18/2020, 5:38 PM

## 2020-10-18 NOTE — Progress Notes (Signed)
Inpatient Rehab Admissions Coordinator Note:   Per OT recommendations, pt was screened for CIR candidacy by Shann Medal, PT, DPT.  At this time we are recommending a CIR consult and I will request a consult from attending.  Please contact me with questions.   Shann Medal, PT, DPT [phone] 10/18/20 9:22 AM

## 2020-10-19 ENCOUNTER — Telehealth (HOSPITAL_COMMUNITY): Payer: Self-pay | Admitting: Pharmacy Technician

## 2020-10-19 ENCOUNTER — Inpatient Hospital Stay (HOSPITAL_COMMUNITY): Payer: Medicare HMO

## 2020-10-19 DIAGNOSIS — I5023 Acute on chronic systolic (congestive) heart failure: Secondary | ICD-10-CM | POA: Diagnosis not present

## 2020-10-19 DIAGNOSIS — N179 Acute kidney failure, unspecified: Secondary | ICD-10-CM

## 2020-10-19 DIAGNOSIS — Z95811 Presence of heart assist device: Secondary | ICD-10-CM | POA: Diagnosis not present

## 2020-10-19 LAB — POCT I-STAT 7, (LYTES, BLD GAS, ICA,H+H)
Acid-base deficit: 1 mmol/L (ref 0.0–2.0)
Bicarbonate: 23.8 mmol/L (ref 20.0–28.0)
Calcium, Ion: 1.12 mmol/L — ABNORMAL LOW (ref 1.15–1.40)
HCT: 27 % — ABNORMAL LOW (ref 39.0–52.0)
Hemoglobin: 9.2 g/dL — ABNORMAL LOW (ref 13.0–17.0)
O2 Saturation: 99 %
Patient temperature: 98.2
Potassium: 3.7 mmol/L (ref 3.5–5.1)
Sodium: 137 mmol/L (ref 135–145)
TCO2: 25 mmol/L (ref 22–32)
pCO2 arterial: 39.1 mmHg (ref 32.0–48.0)
pH, Arterial: 7.391 (ref 7.350–7.450)
pO2, Arterial: 135 mmHg — ABNORMAL HIGH (ref 83.0–108.0)

## 2020-10-19 LAB — RENAL FUNCTION PANEL
Albumin: 2.4 g/dL — ABNORMAL LOW (ref 3.5–5.0)
Albumin: 2.4 g/dL — ABNORMAL LOW (ref 3.5–5.0)
Anion gap: 11 (ref 5–15)
Anion gap: 11 (ref 5–15)
BUN: 37 mg/dL — ABNORMAL HIGH (ref 6–20)
BUN: 31 mg/dL — ABNORMAL HIGH (ref 6–20)
CO2: 23 mmol/L (ref 22–32)
CO2: 23 mmol/L (ref 22–32)
Calcium: 8 mg/dL — ABNORMAL LOW (ref 8.9–10.3)
Calcium: 7.5 mg/dL — ABNORMAL LOW (ref 8.9–10.3)
Chloride: 102 mmol/L (ref 98–111)
Chloride: 100 mmol/L (ref 98–111)
Creatinine, Ser: 2.73 mg/dL — ABNORMAL HIGH (ref 0.61–1.24)
Creatinine, Ser: 2.29 mg/dL — ABNORMAL HIGH (ref 0.61–1.24)
GFR, Estimated: 26 mL/min — ABNORMAL LOW (ref >60)
GFR, Estimated: 32 mL/min — ABNORMAL LOW (ref >60)
Glucose, Bld: 120 mg/dL — ABNORMAL HIGH (ref 70–99)
Glucose, Bld: 265 mg/dL — ABNORMAL HIGH (ref 70–99)
Phosphorus: 3.7 mg/dL (ref 2.5–4.6)
Phosphorus: 2.9 mg/dL (ref 2.5–4.6)
Potassium: 3.8 mmol/L (ref 3.5–5.1)
Potassium: 3.8 mmol/L (ref 3.5–5.1)
Sodium: 136 mmol/L (ref 135–145)
Sodium: 134 mmol/L — ABNORMAL LOW (ref 135–145)

## 2020-10-19 LAB — BPAM RBC
Blood Product Expiration Date: 202204012359
Blood Product Expiration Date: 202204022359
ISSUE DATE / TIME: 202203130748
ISSUE DATE / TIME: 202203150831
Unit Type and Rh: 600
Unit Type and Rh: 600

## 2020-10-19 LAB — CBC WITH DIFFERENTIAL/PLATELET
Abs Immature Granulocytes: 0.21 10*3/uL — ABNORMAL HIGH (ref 0.00–0.07)
Basophils Absolute: 0 10*3/uL (ref 0.0–0.1)
Basophils Relative: 0 %
Eosinophils Absolute: 0.2 10*3/uL (ref 0.0–0.5)
Eosinophils Relative: 1 %
HCT: 26.8 % — ABNORMAL LOW (ref 39.0–52.0)
Hemoglobin: 8.5 g/dL — ABNORMAL LOW (ref 13.0–17.0)
Immature Granulocytes: 1 %
Lymphocytes Relative: 6 %
Lymphs Abs: 1.1 10*3/uL (ref 0.7–4.0)
MCH: 28.1 pg (ref 26.0–34.0)
MCHC: 31.7 g/dL (ref 30.0–36.0)
MCV: 88.7 fL (ref 80.0–100.0)
Monocytes Absolute: 1.4 10*3/uL — ABNORMAL HIGH (ref 0.1–1.0)
Monocytes Relative: 8 %
Neutro Abs: 14.8 10*3/uL — ABNORMAL HIGH (ref 1.7–7.7)
Neutrophils Relative %: 84 %
Platelets: 262 10*3/uL (ref 150–400)
RBC: 3.02 MIL/uL — ABNORMAL LOW (ref 4.22–5.81)
RDW: 18.1 % — ABNORMAL HIGH (ref 11.5–15.5)
WBC: 17.7 10*3/uL — ABNORMAL HIGH (ref 4.0–10.5)
nRBC: 1.3 % — ABNORMAL HIGH (ref 0.0–0.2)

## 2020-10-19 LAB — COOXEMETRY PANEL
Carboxyhemoglobin: 1.5 % (ref 0.5–1.5)
Methemoglobin: 1 % (ref 0.0–1.5)
O2 Saturation: 64.3 %
Total hemoglobin: 8.3 g/dL — ABNORMAL LOW (ref 12.0–16.0)

## 2020-10-19 LAB — TYPE AND SCREEN
ABO/RH(D): A NEG
Antibody Screen: NEGATIVE
Unit division: 0
Unit division: 0

## 2020-10-19 LAB — POCT I-STAT EG7
Acid-base deficit: 2 mmol/L (ref 0.0–2.0)
Bicarbonate: 23.5 mmol/L (ref 20.0–28.0)
Calcium, Ion: 1.13 mmol/L — ABNORMAL LOW (ref 1.15–1.40)
HCT: 24 % — ABNORMAL LOW (ref 39.0–52.0)
Hemoglobin: 8.2 g/dL — ABNORMAL LOW (ref 13.0–17.0)
O2 Saturation: 60 %
Potassium: 3.8 mmol/L (ref 3.5–5.1)
Sodium: 139 mmol/L (ref 135–145)
TCO2: 25 mmol/L (ref 22–32)
pCO2, Ven: 44.2 mmHg (ref 44.0–60.0)
pH, Ven: 7.335 (ref 7.250–7.430)
pO2, Ven: 33 mmHg (ref 32.0–45.0)

## 2020-10-19 LAB — LACTATE DEHYDROGENASE: LDH: 571 U/L — ABNORMAL HIGH (ref 98–192)

## 2020-10-19 LAB — PROTIME-INR
INR: 1.7 — ABNORMAL HIGH (ref 0.8–1.2)
Prothrombin Time: 19 seconds — ABNORMAL HIGH (ref 11.4–15.2)

## 2020-10-19 LAB — GLUCOSE, CAPILLARY
Glucose-Capillary: 123 mg/dL — ABNORMAL HIGH (ref 70–99)
Glucose-Capillary: 111 mg/dL — ABNORMAL HIGH (ref 70–99)
Glucose-Capillary: 159 mg/dL — ABNORMAL HIGH (ref 70–99)
Glucose-Capillary: 205 mg/dL — ABNORMAL HIGH (ref 70–99)
Glucose-Capillary: 224 mg/dL — ABNORMAL HIGH (ref 70–99)
Glucose-Capillary: 140 mg/dL — ABNORMAL HIGH (ref 70–99)

## 2020-10-19 LAB — MAGNESIUM: Magnesium: 2.6 mg/dL — ABNORMAL HIGH (ref 1.7–2.4)

## 2020-10-19 MED ORDER — SENNOSIDES-DOCUSATE SODIUM 8.6-50 MG PO TABS
1.0000 | ORAL_TABLET | Freq: Two times a day (BID) | ORAL | Status: DC
  Administered 2020-10-19 (×2): 1 via ORAL
  Filled 2020-10-19 (×2): qty 1

## 2020-10-19 MED ORDER — WARFARIN SODIUM 2 MG PO TABS
4.0000 mg | ORAL_TABLET | Freq: Every day | ORAL | Status: DC
  Administered 2020-10-19: 4 mg via ORAL
  Filled 2020-10-19: qty 2

## 2020-10-19 MED ORDER — B COMPLEX-C PO TABS
1.0000 | ORAL_TABLET | Freq: Every day | ORAL | Status: DC
  Administered 2020-10-19 – 2020-10-31 (×13): 1 via ORAL
  Filled 2020-10-19 (×13): qty 1

## 2020-10-19 NOTE — Progress Notes (Signed)
Physical Therapy Treatment Patient Details Name: Marcus Johnson. MRN: [id number] DOB: [birth year] Today's Date: 10/19/2020    History of Present Illness 60 y.o. male admitted 2/28 with ICM for LVAD workup. Impella placed 3/1. Heartmate 3 LVAD placed 3/10. PMHx: DM2, CAD s/p CABG 4431, systolic HF due to ischemic cardiomyopathy with EF 20-25% (echo 12/15), DM2 and CKD. He is s/p Medtronic single chamber ICD. Covid 1/22    PT Comments    Pt make good strides toward goals.  After realizing how well pt tolerating activity on CRRT, decision made to end up in the chair.  Emphasis on transition to EOB, scooting, sitting balance, sit to stands and pre-gait/standing activity, ending with stand pivot transfer to the recliner.    Follow Up Recommendations  Supervision/Assistance - 24 hour;CIR     Equipment Recommendations  Other (comment) (TBA next venue,  Maybe RW)    Recommendations for Other Services       Precautions / Restrictions Precautions Precautions: Fall;Sternal Precaution Comments: LVAD, multiple lines Restrictions RUE Weight Bearing: Partial weight bearing    Mobility  Bed Mobility Overal bed mobility: Needs Assistance Bed Mobility: Supine to Sit     Supine to sit: Mod assist;+2 for safety/equipment     General bed mobility comments: worked on building some momentum up and forward, but still needed mod assist.    Transfers Overall transfer level: Needs assistance   Transfers: Sit to/from [facility] Sit to Stand: Min assist;Mod assist;+2 physical assistance Stand pivot transfers: Mod assist;+2 physical assistance       General transfer comment: worked on technique to decrease boost and foward assist from mod to min assist each successive standing trial.  Ambulation/Gait                 Stairs             Wheelchair Mobility    Modified Rankin (Stroke Patients Only)       Balance Overall balance assessment: Needs  assistance   Sitting balance-Leahy Scale: Poor Sitting balance - Comments: posterior bias, needing min to min guard assist   Standing balance support: Bilateral upper extremity supported Standing balance-Leahy Scale: Poor Standing balance comment: reliant on external support, UE's on chairback.  No LE's buckling today.  pt worked on w/shifts and low amplitude marching in place.                            Cognition Arousal/Alertness: Awake/alert Behavior During Therapy: Flat affect Overall Cognitive Status: Within Functional Limits for tasks assessed (but not likely at baseline, getting the big picture, but not the detail of main context)                                        Exercises      General Comments General comments (skin integrity, edema, etc.): vss on CRRT and RA      Pertinent Vitals/Pain Pain Assessment: Faces Faces Pain Scale: No hurt Pain Location: RUE Pain Descriptors / Indicators: Numbness;Guarding Pain Intervention(s): Monitored during session    Home Living                      Prior Function            PT Goals (current goals can now be found in the care plan section)  Acute Rehab PT Goals Patient Stated Goal: to return to independent mobility PT Goal Formulation: With patient Time For Goal Achievement: 10/31/20 Potential to Achieve Goals: Fair Progress towards PT goals: Progressing toward goals    Frequency    Min 4X/week      PT Plan Current plan remains appropriate    Co-evaluation              AM-PAC PT "6 Clicks" Mobility   Outcome Measure  Help needed turning from your back to your side while in a flat bed without using bedrails?: A Lot Help needed moving from lying on your back to sitting on the side of a flat bed without using bedrails?: A Lot Help needed moving to and from a bed to a chair (including a wheelchair)?: A Lot Help needed standing up from a chair using your arms (e.g.,  wheelchair or bedside chair)?: A Lot Help needed to walk in hospital room?: A Lot Help needed climbing 3-5 steps with a railing? : A Lot 6 Click Score: 12    End of Session   Activity Tolerance: Patient tolerated treatment well Patient left: in chair;with call bell/phone within reach;with chair alarm set (on lift padding) Nurse Communication: Mobility status;Precautions PT Visit Diagnosis: Unsteadiness on feet (R26.81);Other abnormalities of gait and mobility (R26.89);Muscle weakness (generalized) (M62.81);Difficulty in walking, not elsewhere classified (R26.2)     Time: 8250-0370 PT Time Calculation (min) (ACUTE ONLY): 33 min  Charges:  $Therapeutic Activity: 23-37 mins                     10/19/2020  Ginger Carne., PT Acute Rehabilitation Services [phone]  (pager) [phone]  (office)   Tessie Fass Marquetta Weiskopf 10/19/2020, 4:04 PM

## 2020-10-19 NOTE — Progress Notes (Signed)
CSW met with patient at bedside. Patient was sitting up in the chair and feeling better than yesterday. Patient was pleased with his participation in PT today and hopeful for continued recovery. CSW discussed upcoming LVAD support group activities and patient appears interested if able to join.  Patient able to converse and joke with CSW and staff. Patient shared plans to call a friend who is very supportive after CSW visit. Patient appears to be in better spirits today and continues to be motivated for recovery. CSW will follow up with patient throughout hospitalization. Jackie Brennan, LCSW, CCSW-MCS [phone]  

## 2020-10-19 NOTE — Plan of Care (Signed)
  Problem: Education: Goal: Knowledge of General Education information will improve Description: Including pain rating scale, medication(s)/side effects and non-pharmacologic comfort measures Outcome: Progressing   Problem: Clinical Measurements: Goal: Ability to maintain clinical measurements within normal limits will improve Outcome: Progressing Goal: Will remain free from infection Outcome: Progressing Goal: Diagnostic test results will improve Outcome: Progressing Goal: Cardiovascular complication will be avoided Outcome: Progressing   Problem: Nutrition: Goal: Adequate nutrition will be maintained Outcome: Progressing   Problem: Pain Managment: Goal: General experience of comfort will improve Outcome: Progressing

## 2020-10-19 NOTE — Progress Notes (Signed)
LVAD Coordinator Rounding Note:  Admitted 09/28/20 due to worsening heart failure symptoms. Recent COVID infection January 2022; did not take medications for 3-4 weeks.   S/p dental extractions 10/07/20 per Dr Benson Norway.  HM III LVAD implanted on 10/13/20 by Dr Cyndia Bent under Destination Therapy criteria.  Pt awake, drowsy, resting quietly. He reports he feels "a little better" this am.   CRRT started yesterday, nurse reports they have been pulling 200 cc/hr without issues. BP has remained stable, Vaso off yesterday on day shift, Levo off overnight, Milrinone weaned to 0.125. A-line dc'd per Dr. Vivi Martens instruction.   Vital signs: Temp: 97.7 HR: 94 Doppler: 90 Auto cuff: 100/84 (90) O2 Sat: 99% on 4L/[place] Wt: 200.1>221.7>219.5>212.7>223>224.6>217.5 lbs     LVAD interrogation reveals:  Speed: 5500 Flow: 5.2 Power: 4.1w PI: 2.2 Hematocrit: 27  Alarms: none Events: 3 PI events on 10/18/20  Fixed speed: 5500 Low speed limit: 5200  Drive Line: Existing VAD dressing removed and site care performed using sterile technique. Drive line exit site cleaned with Chlora prep applicators x 2, allowed to dry, and gauze dressing with silver strip re-applied. Exit site healing and unincorporated the velour is fully implanted at exit site. There is 1 suture visible that is wrapped around the driveline. Moderate amount of sanguineous drainage on gauze dressing. No redness, tenderness, foul odor or rash noted. Few inches of drive line from exit site to modular cable connection, making positioning/anchoring of drive line difficult. Drive line anchor correctly applied. Continue daily dressing changes by VAD coordinator or nurse champion. Next dressing change is due 10/20/20.  Labs:  LDH trend: 444>670>575>571  INR trend: 1.4>1.7>1.8>1.7  Creatinine: 1.84>3.0>3.4>2.73  Anticoagulation Plan: -INR Goal: 2.0-2.5 -ASA Dose: 325 mg until INR therapeutic   Blood Products:  IntraOp:   1 Platelet  6 FFP  3  PRBC  1035 cell saver Postop:   10/13/20:   4 FFP  4 PRBC    10/14/20:  1 FFP  1 PRBC   10/16/20:  1 PRBC   10/18/20:  1 PRBC  Device: - Medtronic single -Therapies: off   Arrythmias: VT in OR requiring defibrillation   Renal: CRRT started 10/19/20  Drips: Epinephrine 5 mcg/min Milrinone 0.125 mcg/kg/min Amiodarone 30 mg/hr  Patient Education: 1. Patient sleepy this am, no family at bedside. HM III Patient Handbook at bedside, but patient says he is too sleepy to start reading.   Plan/Recommendations:  1. Call VAD coordinator for equipment or drive line issues 2. Daily drive line dressing change per VAD coordinator or nurse champion. Next dressing change due 10/20/20  Zada Girt RN [place] Coordinator  Office: [phone]  24/7 Pager: [phone]

## 2020-10-19 NOTE — Progress Notes (Addendum)
Patient ID: Marcus Johnson., male   DOB: June 17, 1961, 60 y.o.   MRN: [id number]      Advanced Heart Failure Rounding Note  PCP-Cardiologist: No primary care provider on file.   Subjective:    - 2/27 Milrinone switched to dobutamine 5 mcg.  - 2/28 Moved to ICU. Dobutamine increased to 7.5 mcg and Norepi 3 mcg added. Diuretics held.  - 3/1 Impella 5.5 placed.  - 3/4 Teeth removed - 3/10 HM3 LVAD placed -3/13 VAD speed increased to 5500. [name] out  - 3/14 Nephrology consulted. Given 1UPRBCs  -3/15 Started CRRT. Given 1UPRBCs.   On milrinone 0.125+ epi 5+ amio 30.  CO-OX 64%.   Hgb 8.5 today.  Creatinine 2.08 -> 2.54>3.0>3.5>2.73 . WBC 28-> 29>27.2->17.9 ->17.6K  LDH 293>444>597>593>670>575 >571   CVP 18  Wants something else to eat. Complaining of r shoulder pain.   Echo: EF 20-25%, normal RV, PASP 61, dilated IVC.   LVAD Interrogation HM 3: Speed: 7062 Flow:5.1   PI: 2.1  Power: 4 VAD interrogated personally.  2 PI events over night.   Objective:   Weight Range: 98.7 kg Body mass index is 30.78 kg/m.   Vital Signs:   Temp:  [97.7 F (36.5 C)-98.8 F (37.1 C)] 97.7 F (36.5 C) (03/16 0300) Pulse Rate:  [65-114] 114 (03/16 0700) Resp:  [0-22] 14 (03/16 0700) BP: (66-110)/(41-85) 66/41 (03/16 0000) SpO2:  [86 %-99 %] 96 % (03/16 0700) Arterial Line BP: (78-117)/(64-108) 109/102 (03/16 0645) Weight:  [98.7 kg-101.9 kg] 98.7 kg (03/16 0500) Last BM Date: 10/12/20  Weight change: Filed Weights   10/18/20 0553 10/18/20 0800 10/19/20 0500  Weight: 101.2 kg 101.9 kg 98.7 kg    Intake/Output:   Intake/Output Summary (Last 24 hours) at 10/19/2020 0723 Last data filed at 10/19/2020 0700 Gross per 24 hour  Intake 3245.66 ml  Output 5872 ml  Net -2626.34 ml      Physical Exam  CVP 18  Physical Exam: GENERAL: No acute distress. Appears weak HEENT: normal +[place] NECK: Supple, JVP difficult to assess  2+ bilaterally, no bruits.  No lymphadenopathy or  thyromegaly appreciated.  RIJ HD cath CARDIAC:  Mechanical heart sounds with LVAD hum present.  LUNGS:  Clear to auscultation bilaterally.  ABDOMEN:  Soft, round, nontender, positive bowel sounds x4.     LVAD exit site:   Dressing dry and intact.  No erythema or drainage.  Stabilization device present and accurately applied.  EXTREMITIES:  Warm and dry, no cyanosis, clubbing, rash or edema. RUE PICC  NEUROLOGIC:  Alert and oriented x 3.    No aphasia.  No dysarthria.  Affect pleasant.      Telemetry   A fib 100s   Labs    CBC Recent Labs    10/18/20 0409 10/18/20 1538 10/19/20 0304 10/19/20 0324  WBC 17.9* 17.6* 17.7*  --   NEUTROABS 14.7*  --  14.8*  --   HGB 7.0* 8.1* 8.5* 9.2*  HCT 22.3* 26.0* 26.8* 27.0*  MCV 91.0 90.0 88.7  --   PLT 246 258 262  --    Basic Metabolic Panel Recent Labs    10/18/20 0409 10/18/20 1538 10/19/20 0304 10/19/20 0324  NA 135 137 136 137  K 3.4* 3.5 3.8 3.7  CL 102 104 102  --   CO2 21* 20* 23  --   GLUCOSE 191* 170* 120*  --   BUN 52* 47* 37*  --   CREATININE 3.50* 3.27* 2.73*  --  CALCIUM 7.5* 8.2* 8.0*  --   MG 2.4  --  2.6*  --   PHOS 5.2* 4.7* 3.7  --    Liver Function Tests Recent Labs    10/18/20 1538 10/19/20 0304  ALBUMIN 2.3* 2.4*   No results for input(s): LIPASE, AMYLASE in the last 72 hours. Cardiac Enzymes No results for input(s): CKTOTAL, CKMB, CKMBINDEX, TROPONINI in the last 72 hours.  BNP: BNP (last 3 results) Recent Labs    10/14/20 0254  BNP 1,097.8*    ProBNP (last 3 results) No results for input(s): PROBNP in the last 8760 hours.   D-Dimer No results for input(s): DDIMER in the last 72 hours. Hemoglobin A1C No results for input(s): HGBA1C in the last 72 hours. Fasting Lipid Panel No results for input(s): CHOL, HDL, LDLCALC, TRIG, CHOLHDL, LDLDIRECT in the last 72 hours. Thyroid Function Tests No results for input(s): TSH, T4TOTAL, T3FREE, THYROIDAB in the last 72 hours.  Invalid  input(s): FREET3  Other results:   Imaging    DG CHEST PORT 1 VIEW  Result Date: 10/18/2020 CLINICAL DATA:  Central line placement EXAM: PORTABLE CHEST 1 VIEW COMPARISON:  Earlier today FINDINGS: Dialysis catheter is in place on the right with tip near the upper cavoatrial junction. The enteric tube reaches the stomach. Stable defibrillator lead placement. LVAD. Stable vascular congestion with asymmetric hazy density on the left. No right apical pneumothorax or new mediastinal widening. Cardiomegaly. IMPRESSION: New dialysis catheter without complicating feature. Electronically Signed   By: Monte Fantasia M.D.   On: 10/18/2020 10:14     Medications:     Scheduled Medications: . sodium chloride   Intravenous Once  . aspirin EC  325 mg Oral Daily   Or  . aspirin  324 mg Per Tube Daily   Or  . aspirin  300 mg Rectal Daily  . Chlorhexidine Gluconate Cloth  6 each Topical Daily  . docusate sodium  100 mg Oral Daily  . feeding supplement  237 mL Oral BID BM  . feeding supplement (PROSource TF)  45 mL Per Tube TID  . insulin aspart  0-15 Units Subcutaneous Q4H  . insulin detemir  15 Units Subcutaneous BID  . lidocaine  1 patch Transdermal Q24H  . mouth rinse  15 mL Mouth Rinse BID  . metoCLOPramide (REGLAN) injection  5 mg Intravenous Q12H  . midodrine  10 mg Oral TID WC  . multivitamin with minerals  1 tablet Oral Daily  . pantoprazole  40 mg Oral Daily  . polyethylene glycol  17 g Oral Daily  . sodium chloride flush  10-40 mL Intracatheter Q12H  . sodium chloride flush  3 mL Intravenous Q12H  . warfarin  2 mg Oral q1600  . Warfarin - Physician Dosing Inpatient   Does not apply q1600    Infusions: .  prismasol BGK 4/2.5 400 mL/hr at 10/19/20 0604  .  prismasol BGK 4/2.5 200 mL/hr at 10/19/20 0603  . sodium chloride Stopped (10/16/20 1426)  . sodium chloride    . sodium chloride 20 mL/hr at 10/17/20 1715  . amiodarone 30 mg/hr (10/19/20 0502)  . epinephrine 5 mcg/min  (10/19/20 0158)  . feeding supplement (VITAL 1.5 CAL) 40 mL/hr at 10/18/20 2331  . lactated ringers    . lactated ringers Stopped (10/17/20 1655)  . milrinone 0.125 mcg/kg/min (10/19/20 0700)  . norepinephrine (LEVOPHED) Adult infusion 5 mcg/min (10/18/20 1801)  . prismasol BGK 4/2.5 1,500 mL/hr at 10/19/20 0603    PRN  Medications: sodium chloride, dextrose, heparin, heparin, morphine injection, ondansetron (ZOFRAN) IV, oxyCODONE, sodium chloride flush, sodium chloride flush, traMADol, white petrolatum    Assessment/Plan   1.Acute on chronic systolic HF -> cardiogenic shock:ICM.Has[employer] ICD.EF 15%. Low output persisted despite milrinone and then dobutamine with rise in creatinine to 2.5. Impella 5.5 placed 3/1.  Creatinine improved to 1.7 and HM3 LVAD was placed on 3/10.  -Patient currently on milrinone 0.125 +epi 5. CO-OX 64% -  Continue midodrine 10 mg tid.  - Volume managed per CRRT  2. LVAD - 3/13 VAD speed increased to 5500. - ASA 325 until INR up to 2. INR 1.7 today.  - LDH trending up  293>444>597>593>670>575>571  - Dosing  anticoagulation per CT surgery.   - DL site ok 3. CAD s/p CABG2002:Last cath in 6/19 with patent grafts.  - Continue Crestor 40 mg daily.  4. AKI on CKD Stage 3b: Suspect that this is a combination of cardiorenal syndrome and diabetic nephropathy and possibly ATN.  - creatinine trending back up post-op 2.1-> 2.5>3>3.5>2.7 . Urine output  50 over night.  Will continue hemodynamic support.  - Nephrology appreciated. Discussed with Dr Bartle/Dr Posey Pronto at bedside. .  -Planning to start CRRT today. Will need HD catheter.  5. Atrial flutter/fibrillation: s/p DC-CV 08/21/18.He has been in rate-controlled atrial fibrillation.  -Continue amio drip 30 mg per hour.  -On coumadin.  6. Wound R Foot: Partial thickness skin loss noted. Cover with mepilex border and change every 5 days.  7. Right arm pain: Think neuropathic, per Dr. Cyndia Bent patient had  stretch of brachial plexus with Impella placement.  8. ID: WBC 17.7  Afebrile, negative cultures. Follow closely  9. YB:FXOVANV of very poor control but hgbA1ctrending down. Most recent 7.7%  - Cover with SSI. 10. Anemia  Hgb stable today at 8.5.  11. Deconditioning PT following.     Length of Stay: [place], NP  10/19/2020, 7:23 AM  Advanced Heart Failure Team Pager [phone] (M-F; [place])  Please contact [facility] for night-coverage after hours (4p -7a ) and weekends on [email or web address]   Agree with above.  Remains on milrinone and epi. Co-ox improved at 64%. Pulling well on CVVHD and he looks better today but CVP still 18. Anuric. R arm still numb. LDH leveling out. INR 1.8   General: Sitting up in chair HEENT: normal  Neck: supple. RIJ HD cath Carotids 2+ bilat; no bruits. No lymphadenopathy or thryomegaly appreciated. Cor: LVAD hum. Sternal wound ok  Lungs: Clear. Abdomen: obese soft, nontender, + distended. No hepatosplenomegaly. No bruits or masses. Good bowel sounds. Driveline site clean. Anchor in place.  Extremities: no cyanosis, clubbing, rash. Warm 2-3+ edema  Neuro: alert & oriented x 3. No focal deficits. Moves all 4 without problem   Co-ox ok on milrinone and epi. Will continue. Continue CVVHD for volume removal. VAD speed optimized with echo yesterday. VAD interrogated personally. Parameters stable.INR 1.8 on warfarin. Bowel regimen adjusted. Continue to mobilize.   CRITICAL CARE Performed by: Glori Bickers  Total critical care time: 35 minutes  Critical care time was exclusive of separately billable procedures and treating other patients.  Critical care was necessary to treat or prevent imminent or life-threatening deterioration.  Critical care was time spent personally by me (independent of midlevel providers or residents) on the following activities: development of treatment plan with patient and/or surrogate as well as nursing, discussions  with consultants, evaluation of patient's response to treatment, examination of patient,  obtaining history from patient or surrogate, ordering and performing treatments and interventions, ordering and review of laboratory studies, ordering and review of radiographic studies, pulse oximetry and re-evaluation of patient's condition.    Glori Bickers, MD  10:16 PM

## 2020-10-19 NOTE — Progress Notes (Signed)
Patient ID: Marcus Johnson., male   DOB: Sep 16, 1960, 60 y.o.   MRN: [id number] HeartMate 3 Rounding Note  Subjective:    Hemodynamics stable overnight. Co-ox 64% Milrinone 0.125 Epi 5 amio 30  MAP has been stable on CRRT. Removing 200 cc/hr.-2626 for 24 hrs. Wt down 7 lbs.   LVAD INTERROGATION:  HeartMate IIl LVAD:  Flow 5.3 liters/min, speed 5500, power 4, PI 2.    Objective:    Vital Signs:   Temp:  [97.7 F (36.5 C)-98.8 F (37.1 C)] 97.7 F (36.5 C) (03/16 0300) Pulse Rate:  [80-114] 114 (03/16 0700) Resp:  [0-22] 14 (03/16 0700) BP: (66-110)/(41-85) 66/41 (03/16 0000) SpO2:  [86 %-99 %] 96 % (03/16 0700) Arterial Line BP: (78-117)/(64-108) 109/102 (03/16 0645) Weight:  [98.7 kg] 98.7 kg (03/16 0500) Last BM Date: 10/12/20 Mean arterial Pressure 84  Intake/Output:   Intake/Output Summary (Last 24 hours) at 10/19/2020 0808 Last data filed at 10/19/2020 0800 Gross per 24 hour  Intake 3493.12 ml  Output 5872 ml  Net -2378.88 ml     Physical Exam: General:  Fatigued but interactive.  No resp difficulty HEENT: normal Neck: HD cath Cor: Distant heart sounds with LVAD hum present. Lungs: clear Nurse changed dressings on chest and incisions reportedly looked good. Abdomen: soft, nontender, nondistended.  Good bowel sounds. Extremities: anasarca Neuro: alert & orientedx3, cranial nerves grossly intact. moves all 4 extremities w/o difficulty. Affect pleasant  Telemetry: atrial fib low 100's on IV amio  Labs: Basic Metabolic Panel: Recent Labs  Lab 10/15/20 0410 10/16/20 0435 10/17/20 0346 10/17/20 0348 10/18/20 0409 10/18/20 1538 10/19/20 0304 10/19/20 0324  NA 138 138 138 140 135 137 136 137  K 4.5 3.9 3.8 3.8 3.4* 3.5 3.8 3.7  CL 106 105 105  --  102 104 102  --   CO2 22 21* 21*  --  21* 20* 23  --   GLUCOSE 122* 159* 75  --  191* 170* 120*  --   BUN 29* 38* 45*  --  52* 47* 37*  --   CREATININE 2.08* 2.54* 3.00*  --  3.50* 3.27* 2.73*  --    CALCIUM 8.2* 8.0* 8.1*  --  7.5* 8.2* 8.0*  --   MG 2.4 2.4 2.6*  --  2.4  --  2.6*  --   PHOS 4.7* 4.6 4.7*  --  5.2* 4.7* 3.7  --     Liver Function Tests: Recent Labs  Lab 10/12/20 1826 10/14/20 0254 10/15/20 0410 10/16/20 0435 10/18/20 1538 10/19/20 0304  AST 31 138* 188* 145*  --   --   ALT 17 29 38 51*  --   --   ALKPHOS 136* 56 71 82  --   --   BILITOT 1.3* 3.5* 3.5* 3.1*  --   --   PROT 6.7 5.5* 6.0* 6.2*  --   --   ALBUMIN 2.5* 3.1* 2.6* 2.4* 2.3* 2.4*   No results for input(s): LIPASE, AMYLASE in the last 168 hours. No results for input(s): AMMONIA in the last 168 hours.  CBC: Recent Labs  Lab 10/15/20 0410 10/16/20 0435 10/17/20 0346 10/17/20 0348 10/18/20 0409 10/18/20 1538 10/19/20 0304 10/19/20 0324  WBC 27.9* 29.4* 27.2*  --  17.9* 17.6* 17.7*  --   NEUTROABS 22.6* 24.4* 22.6*  --  14.7*  --  14.8*  --   HGB 8.2* 7.3* 8.0* 8.5* 7.0* 8.1* 8.5* 9.2*  HCT 25.5* 22.7* 23.8* 25.0* 22.3* 26.0*  26.8* 27.0*  MCV 86.7 87.6 87.2  --  91.0 90.0 88.7  --   PLT 184 232 262  --  246 258 262  --     INR: Recent Labs  Lab 10/15/20 0410 10/16/20 0435 10/17/20 0346 10/18/20 0409 10/19/20 0304  INR 1.6* 1.7* 1.7* 1.8* 1.7*    Other results:  EKG:   Imaging: US RENAL  Result Date: 10/17/2020 CLINICAL DATA:  Acute on chronic renal insufficiency EXAM: RENAL / URINARY TRACT ULTRASOUND COMPLETE COMPARISON:  09/30/2020 FINDINGS: Right Kidney: Renal measurements: 13.1 x 5.7 x 7.2 cm = volume: 282.3 mL. Echogenicity within normal limits. No mass or hydronephrosis visualized. Left Kidney: Renal measurements: 12.7 x 5.8 x 6.7 cm = volume: 255.8 mL. Echogenicity within normal limits. No mass or hydronephrosis visualized. Bladder: Bladder is decompressed with a Foley catheter. Other: None. IMPRESSION: 1. Unremarkable renal ultrasound. Electronically Signed   By: Randa Ngo M.D.   On: 10/17/2020 22:25   DG CHEST PORT 1 VIEW  Result Date: 10/19/2020 CLINICAL DATA:   Chest pain EXAM: PORTABLE CHEST 1 VIEW COMPARISON:  October 18, 2020 FINDINGS: Central catheter tip is in the right atrium, slightly beyond the cavoatrial junction. Enteric tube tip is below the diaphragm. There is a left ventricular assist device. Pacemaker lead attached to right ventricle. No pneumothorax. There is persistent left lower lobe airspace opacity with small left pleural effusion. Right lung is clear. Stable cardiomegaly. Status post coronary artery bypass grafting. IMPRESSION: Tube and catheter positions as described without pneumothorax. Left ventricular assist device present. Left lower lobe atelectasis and airspace opacity. A degree of pneumonia in the left lower lobe is questioned. Small left pleural effusion. Right lung clear. Stable cardiac silhouette. Electronically Signed   By: Lowella Grip III M.D.   On: 10/19/2020 07:56   DG CHEST PORT 1 VIEW  Result Date: 10/18/2020 CLINICAL DATA:  Central line placement EXAM: PORTABLE CHEST 1 VIEW COMPARISON:  Earlier today FINDINGS: Dialysis catheter is in place on the right with tip near the upper cavoatrial junction. The enteric tube reaches the stomach. Stable defibrillator lead placement. LVAD. Stable vascular congestion with asymmetric hazy density on the left. No right apical pneumothorax or new mediastinal widening. Cardiomegaly. IMPRESSION: New dialysis catheter without complicating feature. Electronically Signed   By: Monte Fantasia M.D.   On: 10/18/2020 10:14   DG Chest Port 1 View  Result Date: 10/18/2020 CLINICAL DATA:  Sore chest.  LVAD EXAM: PORTABLE CHEST 1 VIEW COMPARISON:  Yesterday FINDINGS: Feeding tube which at least reaches the stomach. LVAD in place. Dual-chamber ICD leads from the left. Right PICC in place. Prior CABG.  Stable cardiomegaly. Vascular congestion and asymmetric left-sided opacity which is likely atelectasis and pleural fluid. No pneumothorax. IMPRESSION: 1. Unremarkable hardware positioning. 2. Vascular  congestion and asymmetric left-sided density likely reflecting atelectasis and pleural fluid. Electronically Signed   By: Monte Fantasia M.D.   On: 10/18/2020 08:02   DG Chest Port 1 View  Result Date: 10/17/2020 CLINICAL DATA:  Status post right-sided PICC line placement. EXAM: PORTABLE CHEST 1 VIEW COMPARISON:  October 16, 2020 (5:29 a.m.) FINDINGS: A single lead ventricular pacer is noted. Multiple sternal wires and vascular clips are seen. There is stable Swan-Ganz catheter and nasogastric tube positioning. Interval PICC line placement is noted with its distal end seen at the junction of the superior vena cava and right atrium. Mild atelectasis and/or infiltrate is seen within the left lung base. There is no evidence of a  pleural effusion or pneumothorax. Mild to moderate severity enlargement of the cardiac silhouette is noted. The visualized skeletal structures are unremarkable. IMPRESSION: 1. Interval PICC line placement and positioning, as described above. 2. No additional significant interval changes when compared to the prior exam. Electronically Signed   By: Virgina Norfolk M.D.   On: 10/17/2020 15:34   DG Abd Portable 1V  Result Date: 10/17/2020 CLINICAL DATA:  Feeding tube placement EXAM: PORTABLE ABDOMEN - 1 VIEW COMPARISON:  None. FINDINGS: Enteric tube passes into the stomach with tip likely within the fundus obscured by LVAD. Bowel gas pattern is unremarkable. IMPRESSION: Enteric tube within the fundus of the stomach. Electronically Signed   By: Macy Mis M.D.   On: 10/17/2020 14:13   DG Abd Portable 1V  Result Date: 10/17/2020 CLINICAL DATA:  Feeding tube placement. EXAM: PORTABLE ABDOMEN - 1 VIEW COMPARISON:  None. FINDINGS: The bowel gas pattern is normal. There appears to be a feeding tube in the proximal stomach, although distal tip is obscured due to overlying left ventricular assist device. No radio-opaque calculi or other significant radiographic abnormality are seen.  IMPRESSION: No evidence of bowel obstruction or ileus. Distal tip of feeding tube is obscured due to overlying left ventricular assist device. Electronically Signed   By: Marijo Conception M.D.   On: 10/17/2020 14:12   DG Abd Portable 1V  Result Date: 10/17/2020 CLINICAL DATA:  Feeding tube placement EXAM: PORTABLE ABDOMEN - 1 VIEW COMPARISON:  Portable exam 1350 hours compared to 1345 hours FINDINGS: Tip of feeding tube projects over the region of the pylorus. LVAD and pacemaker with leads present. Bowel gas pattern normal. Epicardial pacing wires present. Visualized bowel gas pattern normal. IMPRESSION: Tip of feeding tube projects over pylorus. Electronically Signed   By: Lavonia Dana M.D.   On: 10/17/2020 14:11   ECHOCARDIOGRAM LIMITED  Result Date: 10/17/2020    ECHOCARDIOGRAM LIMITED REPORT   Patient Name:   Demarius Archila. Date of Exam: 10/17/2020 Medical Rec #:  [id number]                Height:       70.5 in Accession #:    [id number]               Weight:       212.7 lb Date of Birth:  08-28-1960                BSA:          2.154 m Patient Age:    51 years                 BP:           0/0 mmHg Patient Gender: M                        HR:           97 bpm. Exam Location:  Inpatient Procedure: Limited Echo, Limited Color Doppler and Cardiac Doppler Indications:    Congestive Heart Failure I50.9  History:        Patient has prior history of Echocardiogram examinations, most                 recent 10/13/2020. CHF and Cardiomyopathy, Previous Myocardial                 Infarction and CAD, Arrythmias:Atrial Fibrillation; Risk  Factors:Diabetes and Hypertension.  Sonographer:    Bernadene Person RDCS Referring Phys: [id number] AMY D CLEGG  Sonographer Comments: Dr. Haroldine Laws present. IMPRESSIONS  1. Technically difficult echo.  2. The LV endocardium is not well visualized. There appears to be severe LV dysfunction . Marland Kitchen Left ventricular ejection fraction, by estimation, is 20 to 25%. The left  ventricle has severely decreased function.  3. The right ventricular size is moderately enlarged.  4. Tricuspid valve regurgitation is mild to moderate. FINDINGS  Left Ventricle: The LV endocardium is not well visualized. There appears to be severe LV dysfunction. Left ventricular ejection fraction, by estimation, is 20 to 25%. The left ventricle has severely decreased function. Right Ventricle: The right ventricular size is moderately enlarged. Tricuspid Valve: The tricuspid valve is grossly normal. Tricuspid valve regurgitation is mild to moderate. Pulmonic Valve: The pulmonic valve was grossly normal. Pulmonic valve regurgitation is trivial. Additional Comments: Technically difficult echo. A device lead is visualized. LEFT VENTRICLE PLAX 2D LVIDd:         3.00 cm  PULMONIC VALVE RVOT Peak grad: 3 mmHg  TRICUSPID VALVE TR Peak grad:   31.4 mmHg TR Vmax:        280.00 cm/s  SHUNTS Pulmonic VTI: 0.089 m Mertie Moores MD Electronically signed by Mertie Moores MD Signature Date/Time: 10/17/2020/12:02:03 PM    Final    [place] EKG SITE RITE  Result Date: 10/17/2020 If Site Rite image not attached, placement could not be confirmed due to current cardiac rhythm.     Medications:     Scheduled Medications: . sodium chloride   Intravenous Once  . aspirin EC  325 mg Oral Daily   Or  . aspirin  324 mg Per Tube Daily   Or  . aspirin  300 mg Rectal Daily  . Chlorhexidine Gluconate Cloth  6 each Topical Daily  . feeding supplement  237 mL Oral BID BM  . feeding supplement (PROSource TF)  45 mL Per Tube TID  . insulin aspart  0-15 Units Subcutaneous Q4H  . insulin detemir  15 Units Subcutaneous BID  . lidocaine  1 patch Transdermal Q24H  . mouth rinse  15 mL Mouth Rinse BID  . metoCLOPramide (REGLAN) injection  5 mg Intravenous Q12H  . midodrine  10 mg Oral TID WC  . multivitamin with minerals  1 tablet Oral Daily  . pantoprazole  40 mg Oral Daily  . polyethylene glycol  17 g Oral Daily  . senna-docusate   1 tablet Oral BID  . sodium chloride flush  10-40 mL Intracatheter Q12H  . sodium chloride flush  3 mL Intravenous Q12H  . warfarin  4 mg Oral q1600  . Warfarin - Physician Dosing Inpatient   Does not apply q1600     Infusions: .  prismasol BGK 4/2.5 400 mL/hr at 10/19/20 0604  .  prismasol BGK 4/2.5 200 mL/hr at 10/19/20 0603  . sodium chloride Stopped (10/16/20 1426)  . sodium chloride    . sodium chloride 20 mL/hr at 10/17/20 1715  . amiodarone 30 mg/hr (10/19/20 0502)  . epinephrine 5 mcg/min (10/19/20 0158)  . feeding supplement (VITAL 1.5 CAL) 40 mL/hr at 10/18/20 2331  . lactated ringers    . lactated ringers Stopped (10/17/20 1655)  . milrinone 0.125 mcg/kg/min (10/19/20 0800)  . norepinephrine (LEVOPHED) Adult infusion 5 mcg/min (10/18/20 1801)  . prismasol BGK 4/2.5 1,500 mL/hr at 10/19/20 0603     PRN Medications:  sodium chloride, dextrose, heparin, heparin, morphine  injection, ondansetron (ZOFRAN) IV, oxyCODONE, sodium chloride flush, sodium chloride flush, traMADol, white petrolatum   Assessment:   1. Acute on chronic systolic congestive heart failure due to ischemic cardiomyopathy with ejection fraction of 20 to 25% admitted with class IIIb symptoms. [id number] LVAD.  2. CAD s/p CABG 2002 with patent grafts on last cath.  3. Stage 3 CKD due to diabetic nephropathy and cardiorenal syndrome. Baseline recently 1.8. Started CRRT yesterday for marked volume excess and rising creat. 4. Atrial fib/flutter:Sinus alternating with AFon amio IV.  5. Right brachial plexus stretch from preop Impella and hematoma around the axillary artery. This should improve with time.  6. Expected acute postop blood loss anemia. Hgb improved with transfusion and volume removal. 7. Nurse reports a couple blisters on buttocks probably due to edema and pressure. Turn frequently and observe.  8. DM: glucose under fair control on Levemir and SSI.  Plan/Discussion:    He remains  hemodynamically stable and able to stop vaso and NE yesterday despite removing volume on CRRT. Continue low dose milrinone and epi.   Plan to remove volume over the next few days with CRRT and then see if kidneys recover.   Tube feeds for nutrition and advance diet as desired.  INR stalled. Will increase Coumadin to 4 mg daily.  IS, PT.  I reviewed the LVAD parameters from today, and compared the results to the patient's prior recorded data.  No programming changes were made.  The LVAD is functioning within specified parameters.  The patient performs LVAD self-test daily.  LVAD interrogation was negative for any significant power changes, alarms or PI events/speed drops.  LVAD equipment check completed and is in good working order.  Back-up equipment present.   LVAD education done on emergency procedures and precautions and reviewed exit site care.  Length of Stay: [address]  Fernande Boyden [facility] 10/19/2020, 8:08 AM

## 2020-10-19 NOTE — Progress Notes (Signed)
Patient ID: Marcus Caspers., male   DOB: [birth year], 60 y.o.   MRN: [id number] [facility] Progress Note   Assessment/ Plan:   1.  Acute kidney injury on chronic kidney disease stage IIIa: With underlying chronic kidney disease likely from diabetes/hypertension and worsening renal function from hemodynamically mediated ATN versus cardiorenal syndrome.  Started on CRRT yesterday for extracorporeal volume unloading after refractory to diuretic therapy/worsening renal function.  Difficult to determine whether he will require dialysis long-term. 2.  Acute exacerbation of chronic systolic heart failure: With evidence of cardiogenic shock status post LVAD.  Remains on pressors with norepinephrine, epinephrine and vasopressin along with inotropic support from dobutamine.  Tolerating CRRT without problems with net -200 cc/h fluid balance.  3.  Anemia: Likely secondary to chronic disease and exacerbated by recent surgical losses. Continue to monitor for indications for PRBC transfusion. 4.  Atrial fibrillation: Intermittently tachycardic, continue amiodarone and resume anticoagulation when permitted.  Subjective:   Tolerated CRRT overnight without any acute issues.  Remains on pressors.   Objective:   BP (!) 66/41 Comment: Aline dampened, positional, very suggish to draw back  Pulse (!) 114   Temp 97.7 F (36.5 C) (Oral)   Resp 14   Ht 5' 10.5" (1.791 m)   Wt 98.7 kg   SpO2 96%   BMI 30.78 kg/m   Intake/Output Summary (Last 24 hours) at 10/19/2020 0737 Last data filed at 10/19/2020 0700 Gross per 24 hour  Intake 3245.66 ml  Output 5872 ml  Net -2626.34 ml   Weight change: 0.7 kg  Physical Exam: Gen: Sitting up in bed, eating breakfast CVS: Irregular tachycardia, S1 and S2 normal Resp: Anteriorly clear to auscultation, diminished breath sounds over bases Abd: Soft, moderately distended, LVAD driveline noted Ext: 1+ lower extremity edema with 2+ upper extremity  edema  Imaging: US RENAL  Result Date: 10/17/2020 CLINICAL DATA:  Acute on chronic renal insufficiency EXAM: RENAL / URINARY TRACT ULTRASOUND COMPLETE COMPARISON:  09/30/2020 FINDINGS: Right Kidney: Renal measurements: 13.1 x 5.7 x 7.2 cm = volume: 282.3 mL. Echogenicity within normal limits. No mass or hydronephrosis visualized. Left Kidney: Renal measurements: 12.7 x 5.8 x 6.7 cm = volume: 255.8 mL. Echogenicity within normal limits. No mass or hydronephrosis visualized. Bladder: Bladder is decompressed with a Foley catheter. Other: None. IMPRESSION: 1. Unremarkable renal ultrasound. Electronically Signed   By: Randa Ngo M.D.   On: 10/17/2020 22:25   DG CHEST PORT 1 VIEW  Result Date: 10/18/2020 CLINICAL DATA:  Central line placement EXAM: PORTABLE CHEST 1 VIEW COMPARISON:  Earlier today FINDINGS: Dialysis catheter is in place on the right with tip near the upper cavoatrial junction. The enteric tube reaches the stomach. Stable defibrillator lead placement. LVAD. Stable vascular congestion with asymmetric hazy density on the left. No right apical pneumothorax or new mediastinal widening. Cardiomegaly. IMPRESSION: New dialysis catheter without complicating feature. Electronically Signed   By: Monte Fantasia M.D.   On: 10/18/2020 10:14   DG Chest Port 1 View  Result Date: 10/18/2020 CLINICAL DATA:  Sore chest.  LVAD EXAM: PORTABLE CHEST 1 VIEW COMPARISON:  Yesterday FINDINGS: Feeding tube which at least reaches the stomach. LVAD in place. Dual-chamber ICD leads from the left. Right PICC in place. Prior CABG.  Stable cardiomegaly. Vascular congestion and asymmetric left-sided opacity which is likely atelectasis and pleural fluid. No pneumothorax. IMPRESSION: 1. Unremarkable hardware positioning. 2. Vascular congestion and asymmetric left-sided density likely reflecting atelectasis and pleural fluid. Electronically Signed  By: Monte Fantasia M.D.   On: 10/18/2020 08:02   DG Chest Port 1  View  Result Date: 10/17/2020 CLINICAL DATA:  Status post right-sided PICC line placement. EXAM: PORTABLE CHEST 1 VIEW COMPARISON:  October 16, 2020 (5:29 a.m.) FINDINGS: A single lead ventricular pacer is noted. Multiple sternal wires and vascular clips are seen. There is stable Swan-Ganz catheter and nasogastric tube positioning. Interval PICC line placement is noted with its distal end seen at the junction of the superior vena cava and right atrium. Mild atelectasis and/or infiltrate is seen within the left lung base. There is no evidence of a pleural effusion or pneumothorax. Mild to moderate severity enlargement of the cardiac silhouette is noted. The visualized skeletal structures are unremarkable. IMPRESSION: 1. Interval PICC line placement and positioning, as described above. 2. No additional significant interval changes when compared to the prior exam. Electronically Signed   By: Virgina Norfolk M.D.   On: 10/17/2020 15:34   DG Abd Portable 1V  Result Date: 10/17/2020 CLINICAL DATA:  Feeding tube placement EXAM: PORTABLE ABDOMEN - 1 VIEW COMPARISON:  None. FINDINGS: Enteric tube passes into the stomach with tip likely within the fundus obscured by LVAD. Bowel gas pattern is unremarkable. IMPRESSION: Enteric tube within the fundus of the stomach. Electronically Signed   By: Macy Mis M.D.   On: 10/17/2020 14:13   DG Abd Portable 1V  Result Date: 10/17/2020 CLINICAL DATA:  Feeding tube placement. EXAM: PORTABLE ABDOMEN - 1 VIEW COMPARISON:  None. FINDINGS: The bowel gas pattern is normal. There appears to be a feeding tube in the proximal stomach, although distal tip is obscured due to overlying left ventricular assist device. No radio-opaque calculi or other significant radiographic abnormality are seen. IMPRESSION: No evidence of bowel obstruction or ileus. Distal tip of feeding tube is obscured due to overlying left ventricular assist device. Electronically Signed   By: Marijo Conception M.D.    On: 10/17/2020 14:12   DG Abd Portable 1V  Result Date: 10/17/2020 CLINICAL DATA:  Feeding tube placement EXAM: PORTABLE ABDOMEN - 1 VIEW COMPARISON:  Portable exam 1350 hours compared to 1345 hours FINDINGS: Tip of feeding tube projects over the region of the pylorus. LVAD and pacemaker with leads present. Bowel gas pattern normal. Epicardial pacing wires present. Visualized bowel gas pattern normal. IMPRESSION: Tip of feeding tube projects over pylorus. Electronically Signed   By: Lavonia Dana M.D.   On: 10/17/2020 14:11   ECHOCARDIOGRAM LIMITED  Result Date: 10/17/2020    ECHOCARDIOGRAM LIMITED REPORT   Patient Name:   Marcus Johnson. Date of Exam: 10/17/2020 Medical Rec #:  [id number]                Height:       70.5 in Accession #:    [id number]               Weight:       212.7 lb Date of Birth:  12-23-1960                BSA:          2.154 m Patient Age:    35 years                 BP:           0/0 mmHg Patient Gender: M  HR:           97 bpm. Exam Location:  Inpatient Procedure: Limited Echo, Limited Color Doppler and Cardiac Doppler Indications:    Congestive Heart Failure I50.9  History:        Patient has prior history of Echocardiogram examinations, most                 recent 10/13/2020. CHF and Cardiomyopathy, Previous Myocardial                 Infarction and CAD, Arrythmias:Atrial Fibrillation; Risk                 Factors:Diabetes and Hypertension.  Sonographer:    Bernadene Person RDCS Referring Phys: [id number] AMY D CLEGG  Sonographer Comments: Dr. Haroldine Laws present. IMPRESSIONS  1. Technically difficult echo.  2. The LV endocardium is not well visualized. There appears to be severe LV dysfunction . Marland Kitchen Left ventricular ejection fraction, by estimation, is 20 to 25%. The left ventricle has severely decreased function.  3. The right ventricular size is moderately enlarged.  4. Tricuspid valve regurgitation is mild to moderate. FINDINGS  Left Ventricle: The LV  endocardium is not well visualized. There appears to be severe LV dysfunction. Left ventricular ejection fraction, by estimation, is 20 to 25%. The left ventricle has severely decreased function. Right Ventricle: The right ventricular size is moderately enlarged. Tricuspid Valve: The tricuspid valve is grossly normal. Tricuspid valve regurgitation is mild to moderate. Pulmonic Valve: The pulmonic valve was grossly normal. Pulmonic valve regurgitation is trivial. Additional Comments: Technically difficult echo. A device lead is visualized. LEFT VENTRICLE PLAX 2D LVIDd:         3.00 cm  PULMONIC VALVE RVOT Peak grad: 3 mmHg  TRICUSPID VALVE TR Peak grad:   31.4 mmHg TR Vmax:        280.00 cm/s  SHUNTS Pulmonic VTI: 0.089 m Mertie Moores MD Electronically signed by Mertie Moores MD Signature Date/Time: 10/17/2020/12:02:03 PM    Final    [place] EKG SITE RITE  Result Date: 10/17/2020 If Site Rite image not attached, placement could not be confirmed due to current cardiac rhythm.   Labs: BMET Recent Labs  Lab 10/14/20 0254 10/14/20 0258 10/14/20 1655 10/14/20 1701 10/15/20 0410 10/16/20 0435 10/17/20 0346 10/17/20 0348 10/18/20 0409 10/18/20 1538 10/19/20 0304 10/19/20 0324  NA 136   < > 138   < > 138 138 138 140 135 137 136 137  K 4.7   < > 4.7   < > 4.5 3.9 3.8 3.8 3.4* 3.5 3.8 3.7  CL 106  --  106  --  106 105 105  --  102 104 102  --   CO2 22  --  23  --  22 21* 21*  --  21* 20* 23  --   GLUCOSE 146*  --  112*  --  122* 159* 75  --  191* 170* 120*  --   BUN 26*  --  28*  --  29* 38* 45*  --  52* 47* 37*  --   CREATININE 1.84*  --  1.90*  --  2.08* 2.54* 3.00*  --  3.50* 3.27* 2.73*  --   CALCIUM 8.5*  --  8.2*  --  8.2* 8.0* 8.1*  --  7.5* 8.2* 8.0*  --   PHOS 3.6  --   --   --  4.7* 4.6 4.7*  --  5.2* 4.7* 3.7  --    < > =  values in this interval not displayed.   CBC Recent Labs  Lab 10/16/20 0435 10/17/20 0346 10/17/20 0348 10/18/20 0409 10/18/20 1538 10/19/20 0304 10/19/20 0324   WBC 29.4* 27.2*  --  17.9* 17.6* 17.7*  --   NEUTROABS 24.4* 22.6*  --  14.7*  --  14.8*  --   HGB 7.3* 8.0*   < > 7.0* 8.1* 8.5* 9.2*  HCT 22.7* 23.8*   < > 22.3* 26.0* 26.8* 27.0*  MCV 87.6 87.2  --  91.0 90.0 88.7  --   PLT 232 262  --  246 258 262  --    < > = values in this interval not displayed.    Medications:    . sodium chloride   Intravenous Once  . aspirin EC  325 mg Oral Daily   Or  . aspirin  324 mg Per Tube Daily   Or  . aspirin  300 mg Rectal Daily  . Chlorhexidine Gluconate Cloth  6 each Topical Daily  . feeding supplement  237 mL Oral BID BM  . feeding supplement (PROSource TF)  45 mL Per Tube TID  . insulin aspart  0-15 Units Subcutaneous Q4H  . insulin detemir  15 Units Subcutaneous BID  . lidocaine  1 patch Transdermal Q24H  . mouth rinse  15 mL Mouth Rinse BID  . metoCLOPramide (REGLAN) injection  5 mg Intravenous Q12H  . midodrine  10 mg Oral TID WC  . multivitamin with minerals  1 tablet Oral Daily  . pantoprazole  40 mg Oral Daily  . polyethylene glycol  17 g Oral Daily  . senna-docusate  1 tablet Oral BID  . sodium chloride flush  10-40 mL Intracatheter Q12H  . sodium chloride flush  3 mL Intravenous Q12H  . warfarin  2 mg Oral q1600  . Warfarin - Physician Dosing Inpatient   Does not apply q1600   Elmarie Shiley, MD 10/19/2020, 7:37 AM

## 2020-10-19 NOTE — Telephone Encounter (Signed)
Advanced Heart Failure Patient Advocate Encounter   Patient was approved to receive Jardiance from [facility]  Effective dates: 10/05/20 through 08/05/21  Charlann Boxer, CPhT

## 2020-10-20 ENCOUNTER — Inpatient Hospital Stay (HOSPITAL_COMMUNITY): Payer: Medicare HMO

## 2020-10-20 DIAGNOSIS — Z95811 Presence of heart assist device: Secondary | ICD-10-CM | POA: Diagnosis not present

## 2020-10-20 DIAGNOSIS — I5023 Acute on chronic systolic (congestive) heart failure: Secondary | ICD-10-CM | POA: Diagnosis not present

## 2020-10-20 LAB — PROTIME-INR
INR: 1.7 — ABNORMAL HIGH (ref 0.8–1.2)
Prothrombin Time: 19.4 seconds — ABNORMAL HIGH (ref 11.4–15.2)

## 2020-10-20 LAB — COOXEMETRY PANEL
Carboxyhemoglobin: 1.6 % — ABNORMAL HIGH (ref 0.5–1.5)
Methemoglobin: 0.9 % (ref 0.0–1.5)
O2 Saturation: 58.7 %
Total hemoglobin: 8.2 g/dL — ABNORMAL LOW (ref 12.0–16.0)

## 2020-10-20 LAB — GLUCOSE, CAPILLARY
Glucose-Capillary: 146 mg/dL — ABNORMAL HIGH (ref 70–99)
Glucose-Capillary: 172 mg/dL — ABNORMAL HIGH (ref 70–99)
Glucose-Capillary: 179 mg/dL — ABNORMAL HIGH (ref 70–99)
Glucose-Capillary: 183 mg/dL — ABNORMAL HIGH (ref 70–99)
Glucose-Capillary: 200 mg/dL — ABNORMAL HIGH (ref 70–99)
Glucose-Capillary: 250 mg/dL — ABNORMAL HIGH (ref 70–99)

## 2020-10-20 LAB — CBC WITH DIFFERENTIAL/PLATELET
Abs Immature Granulocytes: 0.15 10*3/uL — ABNORMAL HIGH (ref 0.00–0.07)
Basophils Absolute: 0 10*3/uL (ref 0.0–0.1)
Basophils Relative: 0 %
Eosinophils Absolute: 0.1 10*3/uL (ref 0.0–0.5)
Eosinophils Relative: 1 %
HCT: 25.3 % — ABNORMAL LOW (ref 39.0–52.0)
Hemoglobin: 8.1 g/dL — ABNORMAL LOW (ref 13.0–17.0)
Immature Granulocytes: 1 %
Lymphocytes Relative: 7 %
Lymphs Abs: 1 10*3/uL (ref 0.7–4.0)
MCH: 28.6 pg (ref 26.0–34.0)
MCHC: 32 g/dL (ref 30.0–36.0)
MCV: 89.4 fL (ref 80.0–100.0)
Monocytes Absolute: 1.2 10*3/uL — ABNORMAL HIGH (ref 0.1–1.0)
Monocytes Relative: 9 %
Neutro Abs: 11.3 10*3/uL — ABNORMAL HIGH (ref 1.7–7.7)
Neutrophils Relative %: 82 %
Platelets: 255 10*3/uL (ref 150–400)
RBC: 2.83 MIL/uL — ABNORMAL LOW (ref 4.22–5.81)
RDW: 18.3 % — ABNORMAL HIGH (ref 11.5–15.5)
WBC: 13.8 10*3/uL — ABNORMAL HIGH (ref 4.0–10.5)
nRBC: 3 % — ABNORMAL HIGH (ref 0.0–0.2)

## 2020-10-20 LAB — RENAL FUNCTION PANEL
Albumin: 2.2 g/dL — ABNORMAL LOW (ref 3.5–5.0)
Albumin: 2.4 g/dL — ABNORMAL LOW (ref 3.5–5.0)
Anion gap: 9 (ref 5–15)
Anion gap: 11 (ref 5–15)
BUN: 29 mg/dL — ABNORMAL HIGH (ref 6–20)
BUN: 27 mg/dL — ABNORMAL HIGH (ref 6–20)
CO2: 24 mmol/L (ref 22–32)
CO2: 24 mmol/L (ref 22–32)
Calcium: 7.4 mg/dL — ABNORMAL LOW (ref 8.9–10.3)
Calcium: 7.8 mg/dL — ABNORMAL LOW (ref 8.9–10.3)
Chloride: 99 mmol/L (ref 98–111)
Chloride: 95 mmol/L — ABNORMAL LOW (ref 98–111)
Creatinine, Ser: 2.04 mg/dL — ABNORMAL HIGH (ref 0.61–1.24)
Creatinine, Ser: 1.83 mg/dL — ABNORMAL HIGH (ref 0.61–1.24)
GFR, Estimated: 37 mL/min — ABNORMAL LOW (ref >60)
GFR, Estimated: 42 mL/min — ABNORMAL LOW (ref >60)
Glucose, Bld: 252 mg/dL — ABNORMAL HIGH (ref 70–99)
Glucose, Bld: 338 mg/dL — ABNORMAL HIGH (ref 70–99)
Phosphorus: 2.3 mg/dL — ABNORMAL LOW (ref 2.5–4.6)
Phosphorus: 3.3 mg/dL (ref 2.5–4.6)
Potassium: 4 mmol/L (ref 3.5–5.1)
Potassium: 4.7 mmol/L (ref 3.5–5.1)
Sodium: 132 mmol/L — ABNORMAL LOW (ref 135–145)
Sodium: 130 mmol/L — ABNORMAL LOW (ref 135–145)

## 2020-10-20 LAB — POCT I-STAT 7, (LYTES, BLD GAS, ICA,H+H)
Acid-Base Excess: 1 mmol/L (ref 0.0–2.0)
Bicarbonate: 24.9 mmol/L (ref 20.0–28.0)
Calcium, Ion: 1.1 mmol/L — ABNORMAL LOW (ref 1.15–1.40)
HCT: 26 % — ABNORMAL LOW (ref 39.0–52.0)
Hemoglobin: 8.8 g/dL — ABNORMAL LOW (ref 13.0–17.0)
O2 Saturation: 99 %
Patient temperature: 97.9
Potassium: 4.2 mmol/L (ref 3.5–5.1)
Sodium: 134 mmol/L — ABNORMAL LOW (ref 135–145)
TCO2: 26 mmol/L (ref 22–32)
pCO2 arterial: 33.1 mmHg (ref 32.0–48.0)
pH, Arterial: 7.484 — ABNORMAL HIGH (ref 7.350–7.450)
pO2, Arterial: 144 mmHg — ABNORMAL HIGH (ref 83.0–108.0)

## 2020-10-20 LAB — TYPE AND SCREEN
ABO/RH(D): A NEG
Antibody Screen: NEGATIVE

## 2020-10-20 LAB — BRAIN NATRIURETIC PEPTIDE: B Natriuretic Peptide: 466.7 pg/mL — ABNORMAL HIGH (ref 0.0–100.0)

## 2020-10-20 LAB — LACTATE DEHYDROGENASE: LDH: 485 U/L — ABNORMAL HIGH (ref 98–192)

## 2020-10-20 LAB — MAGNESIUM: Magnesium: 2.3 mg/dL (ref 1.7–2.4)

## 2020-10-20 LAB — PATHOLOGIST SMEAR REVIEW: Path Review: 3172022

## 2020-10-20 MED ORDER — WARFARIN SODIUM 5 MG PO TABS
5.0000 mg | ORAL_TABLET | Freq: Every day | ORAL | Status: DC
  Administered 2020-10-20: 5 mg via ORAL
  Filled 2020-10-20: qty 1

## 2020-10-20 MED ORDER — SORBITOL 70 % SOLN
30.0000 mL | Freq: Once | Status: AC
  Administered 2020-10-20: 30 mL via ORAL
  Filled 2020-10-20: qty 30

## 2020-10-20 MED ORDER — POTASSIUM PHOSPHATES 15 MMOLE/5ML IV SOLN
20.0000 mmol | Freq: Once | INTRAVENOUS | Status: AC
  Administered 2020-10-20: 20 mmol via INTRAVENOUS
  Filled 2020-10-20: qty 6.67

## 2020-10-20 MED ORDER — VITAL 1.5 CAL PO LIQD
1000.0000 mL | ORAL | Status: DC
  Administered 2020-10-20: 1000 mL

## 2020-10-20 MED ORDER — GABAPENTIN 100 MG PO CAPS
100.0000 mg | ORAL_CAPSULE | Freq: Every day | ORAL | Status: DC
  Administered 2020-10-20 – 2020-11-13 (×25): 100 mg via ORAL
  Filled 2020-10-20 (×25): qty 1

## 2020-10-20 MED ORDER — SENNOSIDES-DOCUSATE SODIUM 8.6-50 MG PO TABS
2.0000 | ORAL_TABLET | Freq: Two times a day (BID) | ORAL | Status: DC
  Administered 2020-10-20 – 2020-10-31 (×22): 2 via ORAL
  Filled 2020-10-20 (×22): qty 2

## 2020-10-20 MED ORDER — PRISMASOL BGK 0/2.5 32-2.5 MEQ/L EC SOLN
Status: DC
  Filled 2020-10-20 (×7): qty 5000

## 2020-10-20 NOTE — Progress Notes (Signed)
Dr Candiss Norse regarding evening potassium of 4.7. Orders received to change post filter to 2K/2.5.

## 2020-10-20 NOTE — Progress Notes (Addendum)
LVAD Coordinator Rounding Note:  Admitted 09/28/20 due to worsening heart failure symptoms. Recent COVID infection January 2022; did not take medications for 3-4 weeks.   S/p dental extractions 10/07/20 per Dr Benson [place].  HM III LVAD implanted on 10/13/20 by Dr Cyndia Bent under Destination Therapy criteria.  Pt awake, sitting up in the chair today.  CRRT started 3/15, nurse reports they have been pulling 200 cc/hr without issues. BP has remained stable, Vaso off, Levo off, Milrinone weaned to 0.125. A-line dc'd.  Vital signs: Temp: 97.9 HR: 99 Doppler: 70 Auto cuff: 85 O2 Sat: 100% on RA Wt: 200.1>221.7>219.5>212.7>223>224.6>217.5>212 lbs     LVAD interrogation reveals:  Speed: 5500 Flow: 5.4 Power: 4.1w PI: 1.9 Hematocrit: 25  Alarms: none Events: 20+ PI events today  Fixed speed: 5500 Low speed limit: 5200  Drive Line: Existing VAD dressing removed and site care performed using sterile technique by nurse champion [place]. Drive line exit site cleaned with Chlora prep applicators x 2, allowed to dry, and gauze dressing with silver strip re-applied. Exit site healing and unincorporated the velour is fully implanted at exit site. There is 1 suture visible that is wrapped around the driveline. Moderate amount of serousanguineous drainage on gauze dressing. No redness, tenderness, foul odor or rash noted. Few inches of drive line from exit site to modular cable connection, making positioning/anchoring of drive line difficult. Drive line anchor correctly applied. Continue daily dressing changes by VAD coordinator or nurse champion. Next dressing change is due 10/21/20.      Labs:  LDH trend: 444>670>575>571>485  INR trend: 1.4>1.7>1.8>1.7  Creatinine: 1.84>3.0>3.4>2.73>2.04  Anticoagulation Plan: -INR Goal: 2.0-2.5 -ASA Dose: 325 mg until INR therapeutic   Blood Products:  IntraOp:   1 Platelet  6 FFP  3 PRBC  1035 cell saver Postop:   10/13/20:   4 FFP  4  PRBC    10/14/20:  1 FFP  1 PRBC   10/16/20:  1 PRBC   10/18/20:  1 PRBC  Device: - Medtronic single -Therapies: off   Arrythmias: VT in OR requiring defibrillation   Renal: CRRT started 10/19/20  Drips: Epinephrine 5 mcg/min Milrinone 0.125 mcg/kg/min Amiodarone 30 mg/hr  Patient Education: 1. Patient in pain this morning up in the chair - no family at bedside. Education inappropriate at this time.   Plan/Recommendations:  1. Call VAD coordinator for equipment or drive line issues 2. Daily drive line dressing change per VAD coordinator or nurse champion. Next dressing change due 10/21/20  Tanda Rockers RN [place] Coordinator  Office: [phone]  24/7 Pager: [phone]

## 2020-10-20 NOTE — Progress Notes (Signed)
Physical Therapy Treatment Patient Details Name: Marcus Johnson. MRN: [id number] DOB: 17-Mar-1961 Today's Date: 10/20/2020    History of Present Illness 60 y.o. male admitted 2/28 with ICM for LVAD workup. Impella placed 3/1. Heartmate 3 LVAD placed 3/10. CRRT started 3/15. PMHx: DM2, CAD s/p CABG 1027, systolic HF due to ischemic cardiomyopathy with EF 20-25% (echo 12/15), DM2 and CKD. He is s/p Medtronic single chamber ICD. Covid 1/22    PT Comments    Pt pleasant with continued limited memory and confusion with slow responses at times and remains only able to recall 2/4 precautions and unaware of LVAD parts. Pt with improved standing tolerance and transfers today but continues to need mod +2 assist for bed mobility and transfers. Pt educated for HEP, precautions and continued progression. RN present throughout to manage CRRT. Pt with lightheadedness in standing.   Speed 5500, flow 5.4, PI 1.8, flow 4.0 MAP 70 SpO2 92% RA    Follow Up Recommendations  Supervision/Assistance - 24 hour;CIR     Equipment Recommendations  Other (comment) (TBD)    Recommendations for Other Services       Precautions / Restrictions Precautions Precautions: Fall;Sternal;Other (comment) Precaution Comments: LVAD, CRRT, multiple lines    Mobility  Bed Mobility Overal bed mobility: Needs Assistance Bed Mobility: Rolling;Sidelying to Sit Rolling: Mod assist Sidelying to sit: Mod assist       General bed mobility comments: max cues for sequence to bend knee and push with foot to assist roll to right with significant assist to bring legs off bed and elevate trunk. min assist for scooting anteriorly    Transfers Overall transfer level: Needs assistance   Transfers: Sit to/from Stand;Stand Pivot Transfers Sit to Stand: Mod assist;+2 physical assistance Stand pivot transfers: Mod assist;+2 physical assistance       General transfer comment: mod +2 assist with pad at sacrum to rock and  rise from bed and recliner with knee blocked. Pt able to stand grossly 1 min each trial and move feet shuffling with bil UE support to pivot bed to recliner. pt unable to stand and shift weight to march in standing maintainining wide BOS  Ambulation/Gait             General Gait Details: not yet able   Stairs             Wheelchair Mobility    Modified Rankin (Stroke Patients Only)       Balance Overall balance assessment: Needs assistance Sitting-balance support: No upper extremity supported;Feet supported Sitting balance-Leahy Scale: Fair Sitting balance - Comments: EOb without physical assist   Standing balance support: Bilateral upper extremity supported Standing balance-Leahy Scale: Poor Standing balance comment: bil Ue support and cues at sacrum for trunk extension                            Cognition Arousal/Alertness: Awake/alert Behavior During Therapy: Flat affect Overall Cognitive Status: Impaired/Different from baseline Area of Impairment: Memory;Following commands;Problem solving                     Memory: Decreased short-term memory;Decreased recall of precautions Following Commands: Follows one step commands with increased time;Follows one step commands inconsistently     Problem Solving: Slow processing General Comments: pt with difficulty recalling all sternal precautions and LVAD equipment, slow to respond at times      Exercises General Exercises - Lower Extremity Long Arc Quad: AAROM;Both;15  reps;Seated Hip Flexion/Marching: AAROM;Both;10 reps;Seated    General Comments        Pertinent Vitals/Pain Pain Score: 4  Pain Location: RUE Pain Descriptors / Indicators: Numbness;Guarding Pain Intervention(s): Limited activity within patient's tolerance;Monitored during session;Repositioned    Home Living                      Prior Function            PT Goals (current goals can now be found in the care  plan section) Progress towards PT goals: Progressing toward goals    Frequency    Min 4X/week      PT Plan Current plan remains appropriate    Co-evaluation              AM-PAC PT "6 Clicks" Mobility   Outcome Measure  Help needed turning from your back to your side while in a flat bed without using bedrails?: A Lot Help needed moving from lying on your back to sitting on the side of a flat bed without using bedrails?: A Lot Help needed moving to and from a bed to a chair (including a wheelchair)?: A Lot Help needed standing up from a chair using your arms (e.g., wheelchair or bedside chair)?: A Lot Help needed to walk in hospital room?: Total Help needed climbing 3-5 steps with a railing? : Total 6 Click Score: 10    End of Session   Activity Tolerance: Patient tolerated treatment well Patient left: in chair;with call bell/phone within reach;with nursing/sitter in room Nurse Communication: Mobility status;Precautions PT Visit Diagnosis: Unsteadiness on feet (R26.81);Other abnormalities of gait and mobility (R26.89);Muscle weakness (generalized) (M62.81);Difficulty in walking, not elsewhere classified (R26.2)     Time: 9509-3267 PT Time Calculation (min) (ACUTE ONLY): 34 min  Charges:  $Therapeutic Exercise: 8-22 mins $Therapeutic Activity: 8-22 mins                     Raeanne Deschler P, PT Acute Rehabilitation Services Pager: [phone] Office: [place] Led[place] 10/20/2020, 9:21 AM

## 2020-10-20 NOTE — Progress Notes (Signed)
Occupational Therapy Treatment Patient Details Name: Marcus Johnson. MRN: [id number] DOB: May 03, 1961 Today's Date: 10/20/2020    History of present illness 60 y.o. male admitted 2/28 with ICM for LVAD workup. Impella placed 3/1. Heartmate 3 LVAD placed 3/10. CRRT started 3/15. PMHx: DM2, CAD s/p CABG 9485, systolic HF due to ischemic cardiomyopathy with EF 20-25% (echo 12/15), DM2 and CKD. He is s/p Medtronic single chamber ICD. Covid 1/22   OT comments  This 60 yo male seen today to focus on AE for self feeding with dominant UE and Bil hand exercises. Both hands look better than they did last session from an edema standpoint, but remain overall week and with decreased strength. He will continue to benefit from acute OT with follow up on CIR.  Follow Up Recommendations  CIR;Supervision/Assistance - 24 hour    Equipment Recommendations  Other (comment) (TBD next venue)    Recommendations for Other Services Rehab consult    Precautions / Restrictions Precautions Precautions: Fall;Sternal Precaution Comments: LVAD, CRRT, multiple lines Restrictions Weight Bearing Restrictions: Yes              ADL either performed or assessed with clinical judgement   ADL Overall ADL's : Needs assistance/impaired Eating/Feeding: Set up Eating/Feeding Details (indicate cue type and reason): intermittent S. Pt seen to see if built up utensils would allow him to use RUE (dominant for self feeding). He was able to self feed with red tubing on spoon but also helped to have RUE propped as well (due to weakness). Also provided pt with mug with handle and lid which he is able to manipulate by himself.                                                   Cognition Arousal/Alertness: Awake/alert Behavior During Therapy: Flat affect Overall Cognitive Status: Impaired/Different from baseline Area of Impairment: Problem solving                       Problem Solving:  Slow processing      Exercises Other Exercises: Pt provided with hand exerciser for both hands; however he is only able to use fo LUE for now even with lowest band. He is unable to squeeze blue squeeze ball in right hand (ball too big)--provided him with two pieces of red tubing taped together to try to squeeze (but this was too small).           Pertinent Vitals/ Pain       Pain Assessment: No/denies pain          Frequency  Min 2X/week        Progress Toward Goals  OT Goals(current goals can now be found in the care plan section)  Progress towards OT goals: Progressing toward goals  Acute Rehab OT Goals Patient Stated Goal: to return to independent mobility OT Goal Formulation: With patient Time For Goal Achievement: 10/31/20 Potential to Achieve Goals: Good  Plan Discharge plan remains appropriate       AM-PAC OT "6 Clicks" Daily Activity     Outcome Measure   Help from another person eating meals?: A Little Help from another person taking care of personal grooming?: A Lot Help from another person toileting, which includes using toliet, bedpan, or urinal?: Total Help from another person bathing (including washing,  rinsing, drying)?: Total Help from another person to put on and taking off regular upper body clothing?: Total Help from another person to put on and taking off regular lower body clothing?: Total 6 Click Score: 9    End of Session    OT Visit Diagnosis: Unsteadiness on feet (R26.81);Other abnormalities of gait and mobility (R26.89);Muscle weakness (generalized) (M62.81);Other symptoms and signs involving cognitive function;Dizziness and giddiness (R42)   Activity Tolerance Patient tolerated treatment well   Patient Left in chair;with call bell/phone within reach   Nurse Communication  (showed RN all of the items I had pt use)        Time: 0929-1000 OT Time Calculation (min): 31 min  Charges: OT General Charges $OT Visit: 1 Visit OT  Treatments $Self Care/Home Management : 8-22 mins $Therapeutic Exercise: 8-22 mins  Marcus Johnson, OTR/L Acute [employer] Pager [phone] Office [phone]      Marcus Johnson 10/20/2020, 11:22 AM

## 2020-10-20 NOTE — Progress Notes (Addendum)
Nutrition Follow Up  DOCUMENTATION CODES:   Not applicable  INTERVENTION:   Transition to nocturnal feeding:  -Vital 1.5 @ 85 ml/hr x 14 hrs (1800-0800) via Cortrak -ProSource TF 45 ml TID  Provides: 1905 kcals, 113 grams protein,  ml free water. Meets 85% kcal needs and 99% of protein needs.   Continue B complex with Vitamin C to account for losses with CRRT   Ensure Enlive po BID, each supplement provides 350 kcal and 20 grams of protein  NUTRITION DIAGNOSIS:   Increased nutrient needs related to chronic illness (CHF) as evidenced by estimated needs.  Ongoing  GOAL:   Patient will meet greater than or equal to 90% of their needs   Progressing   MONITOR:   PO intake,Supplement acceptance,Labs,Weight trends,Skin,I & O's  REASON FOR ASSESSMENT:   Consult LVAD Eval  ASSESSMENT:   Marcus Johnson. is a 60 y.o. male who has a h/o DM2, CAD s/p CABG 3016, systolic HF due to ischemic cardiomyopathy with EF 20-25% (echo 12/15), DM2 and CKD. He is s/p [employer] single chamber ICD.Pt admitted with CHF.  3/01- s/p impella  3/04- s/p multiple tooth extraction  3/10- s/p impella removal, LVAD implantation  3/11- extubated  3/14- Cortrak placed, continuous feeds started 3/15- CRRT started  Pt discussed during ICU rounds and with RN.   Remains on CRRT- pulling 100-200 ml/hr as tolerated. Net -3.4 L. Had large BM this afternoon. Appetite improving. Consumed 50% of meals yesterday and this am. Taking 1-2 Ensures. Transition to nocturnal feeds to allow break during the day. May decrease rate as PO intake progresses.   Admission weight: 97.5 kg  Current weight: 96.2 kg (down from 101.6 kg three days ago)  UOP: 50 ml x 24 hrs  CRRT: 7016 ml x 24 hrs  Drips: epinephrine, potassium phosphate Medications: SS novolog, levemir, 5 mg reglan BID, miralax, senokot Labs: Na 132 (L) Phosphorus 2.3 (L) CBG 146-200  Diet Order:   Diet Order            Diet regular Room  service appropriate? Yes; Fluid consistency: Thin  Diet effective now                 EDUCATION NEEDS:   Education needs have been addressed  Skin:  Skin Assessment: Skin Integrity Issues: Skin Integrity Issues:: Incisions Incisions: chest x3 Stage II: coccyx x3 Other: skin tear R chest, skin tear L arm,   Last BM:  3/17  Height:   Ht Readings from Last 1 Encounters:  10/03/20 5' 10.5" (1.791 m)    Weight:   Wt Readings from Last 1 Encounters:  10/20/20 96.2 kg    Ideal Body Weight:  76.8 kg  BMI:  Body mass index is 30 kg/m.  Estimated Nutritional Needs:   Kcal:  2250-2450 kcal  Protein:  115-130 grams  Fluid:  2 L  Mariana Single RD, LDN Clinical Nutrition Pager listed in [place]

## 2020-10-20 NOTE — Progress Notes (Signed)
Patient ID: Marcus Falotico., male   DOB: 03-Aug-1961, 60 y.o.   MRN: [id number] [facility] 3 Rounding Note  Subjective:    Feeling better. Has been up to chair with PT. Eating better 50-75%. Tolerating tube feeds.  Hemodynamics stable on milrinone 0.125, epi 5   Co-ox 59%.  CVP 18 this am.  -4322 cc yesterday Wt down 5.5 lbs, still 12 lbs over preop.  LVAD INTERROGATION:  [facility] IlI LVAD:  Flow 5 liters/min, speed 5500, power 4, PI 2.1.    Objective:    Vital Signs:   Temp:  [96.2 F (35.7 C)-97.9 F (36.6 C)] 97.9 F (36.6 C) (03/16 1945) Pulse Rate:  [84-106] 95 (03/17 0600) Resp:  [8-21] 14 (03/17 0600) BP: (72-162)/(21-139) 150/139 (03/17 0600) SpO2:  [89 %-100 %] 92 % (03/17 0600) Weight:  [96.2 kg] 96.2 kg (03/17 0500) Last BM Date: 10/12/20 Mean arterial Pressure 80's  Intake/Output:   Intake/Output Summary (Last 24 hours) at 10/20/2020 0814 Last data filed at 10/20/2020 0700 Gross per 24 hour  Intake 2424.87 ml  Output 6786 ml  Net -4361.13 ml     Physical Exam: General:  Less edema, more engaging. No resp difficulty HEENT: normal Neck: HD cath Cor: Distant heart sounds with LVAD hum present. Lungs: clear Abdomen: soft, nontender, nondistended. Good bowel sounds. Extremities: improving edema Neuro: alert & orientedx3, cranial nerves grossly intact. moves all 4 extremities w/o difficulty. Affect pleasant  Telemetry: atrial fib 90's  Labs: Basic Metabolic Panel: Recent Labs  Lab 10/16/20 0435 10/17/20 0346 10/17/20 0348 10/18/20 0409 10/18/20 1538 10/19/20 0304 10/19/20 0324 10/19/20 1600 10/20/20 0255 10/20/20 0310 10/20/20 0311  NA 138 138   < > 135 137 136 137 134* 134*  --  132*  K 3.9 3.8   < > 3.4* 3.5 3.8 3.7 3.8 4.2  --  4.0  CL 105 105  --  102 104 102  --  100  --   --  99  CO2 21* 21*  --  21* 20* 23  --  23  --   --  24  GLUCOSE 159* 75  --  191* 170* 120*  --  265*  --   --  252*  BUN 38* 45*  --  52* 47* 37*  --  31*   --   --  29*  CREATININE 2.54* 3.00*  --  3.50* 3.27* 2.73*  --  2.29*  --   --  2.04*  CALCIUM 8.0* 8.1*  --  7.5* 8.2* 8.0*  --  7.5*  --   --  7.4*  MG 2.4 2.6*  --  2.4  --  2.6*  --   --   --  2.3  --   PHOS 4.6 4.7*  --  5.2* 4.7* 3.7  --  2.9  --   --  2.3*   < > = values in this interval not displayed.    Liver Function Tests: Recent Labs  Lab 10/14/20 0254 10/15/20 0410 10/16/20 0435 10/18/20 1538 10/19/20 0304 10/19/20 1600 10/20/20 0311  AST 138* 188* 145*  --   --   --   --   ALT 29 38 51*  --   --   --   --   ALKPHOS 56 71 82  --   --   --   --   BILITOT 3.5* 3.5* 3.1*  --   --   --   --   PROT 5.5* 6.0* 6.2*  --   --   --   --  ALBUMIN 3.1* 2.6* 2.4* 2.3* 2.4* 2.4* 2.2*   No results for input(s): LIPASE, AMYLASE in the last 168 hours. No results for input(s): AMMONIA in the last 168 hours.  CBC: Recent Labs  Lab 10/16/20 0435 10/17/20 0346 10/17/20 0348 10/18/20 0409 10/18/20 1538 10/19/20 0304 10/19/20 0324 10/20/20 0255 10/20/20 0310  WBC 29.4* 27.2*  --  17.9* 17.6* 17.7*  --   --  PENDING  NEUTROABS 24.4* 22.6*  --  14.7*  --  14.8*  --   --  PENDING  HGB 7.3* 8.0*   < > 7.0* 8.1* 8.5* 9.2* 8.8* 8.1*  HCT 22.7* 23.8*   < > 22.3* 26.0* 26.8* 27.0* 26.0* 25.3*  MCV 87.6 87.2  --  91.0 90.0 88.7  --   --  89.4  PLT 232 262  --  246 258 262  --   --  255   < > = values in this interval not displayed.    INR: Recent Labs  Lab 10/16/20 0435 10/17/20 0346 10/18/20 0409 10/19/20 0304 10/20/20 0310  INR 1.7* 1.7* 1.8* 1.7* 1.7*    Other results:  EKG:   Imaging: DG CHEST PORT 1 VIEW  Result Date: 10/19/2020 CLINICAL DATA:  Chest pain EXAM: PORTABLE CHEST 1 VIEW COMPARISON:  October 18, 2020 FINDINGS: Central catheter tip is in the right atrium, slightly beyond the cavoatrial junction. Enteric tube tip is below the diaphragm. There is a left ventricular assist device. Pacemaker lead attached to right ventricle. No pneumothorax. There is  persistent left lower lobe airspace opacity with small left pleural effusion. Right lung is clear. Stable cardiomegaly. Status post coronary artery bypass grafting. IMPRESSION: Tube and catheter positions as described without pneumothorax. Left ventricular assist device present. Left lower lobe atelectasis and airspace opacity. A degree of pneumonia in the left lower lobe is questioned. Small left pleural effusion. Right lung clear. Stable cardiac silhouette. Electronically Signed   By: Lowella Grip III M.D.   On: 10/19/2020 07:56   DG CHEST PORT 1 VIEW  Result Date: 10/18/2020 CLINICAL DATA:  Central line placement EXAM: PORTABLE CHEST 1 VIEW COMPARISON:  Earlier today FINDINGS: Dialysis catheter is in place on the right with tip near the upper cavoatrial junction. The enteric tube reaches the stomach. Stable defibrillator lead placement. LVAD. Stable vascular congestion with asymmetric hazy density on the left. No right apical pneumothorax or new mediastinal widening. Cardiomegaly. IMPRESSION: New dialysis catheter without complicating feature. Electronically Signed   By: Monte Fantasia M.D.   On: 10/18/2020 10:14      Medications:     Scheduled Medications: . sodium chloride   Intravenous Once  . aspirin EC  325 mg Oral Daily   Or  . aspirin  324 mg Per Tube Daily   Or  . aspirin  300 mg Rectal Daily  . B-complex with vitamin C  1 tablet Oral Daily  . Chlorhexidine Gluconate Cloth  6 each Topical Daily  . feeding supplement  237 mL Oral BID BM  . feeding supplement (PROSource TF)  45 mL Per Tube TID  . gabapentin  100 mg Oral QHS  . insulin aspart  0-15 Units Subcutaneous Q4H  . insulin detemir  15 Units Subcutaneous BID  . lidocaine  1 patch Transdermal Q24H  . mouth rinse  15 mL Mouth Rinse BID  . metoCLOPramide (REGLAN) injection  5 mg Intravenous Q12H  . midodrine  10 mg Oral TID WC  . pantoprazole  40 mg Oral Daily  .  polyethylene glycol  17 g Oral Daily  .  senna-docusate  2 tablet Oral BID  . sodium chloride flush  10-40 mL Intracatheter Q12H  . sodium chloride flush  3 mL Intravenous Q12H  . warfarin  5 mg Oral q1600  . Warfarin - Physician Dosing Inpatient   Does not apply q1600     Infusions: .  prismasol BGK 4/2.5 400 mL/hr at 10/19/20 2315  .  prismasol BGK 4/2.5 200 mL/hr at 10/19/20 2315  . sodium chloride Stopped (10/16/20 1426)  . sodium chloride    . sodium chloride 20 mL/hr at 10/17/20 1715  . amiodarone 30 mg/hr (10/20/20 0242)  . epinephrine 5 mcg/min (10/20/20 0244)  . feeding supplement (VITAL 1.5 CAL) 1,000 mL (10/20/20 0030)  . lactated ringers    . lactated ringers Stopped (10/17/20 1655)  . milrinone 0.125 mcg/kg/min (10/20/20 0700)  . prismasol BGK 4/2.5 1,500 mL/hr at 10/20/20 0539     PRN Medications:  sodium chloride, dextrose, heparin, heparin, morphine injection, ondansetron (ZOFRAN) IV, oxyCODONE, sodium chloride flush, sodium chloride flush, traMADol, white petrolatum   Assessment:   1. Acute on chronic systolic congestive heart failure due to ischemic cardiomyopathy with ejection fraction of 20 to 25% admitted with class IIIb symptoms. [id number] LVAD.  2. CAD s/p CABG 2002 with patent grafts on last cath.  3. Stage 3 CKD due to diabetic nephropathy and cardiorenal syndrome. Baseline recently 1.8. Started CRRT yesterday for marked volume excess and rising creat. 4. Atrial fib/flutter:Sinus alternating with AFon amio IV.  5. Right brachial plexus stretch from preop Impella and hematoma around the axillary artery. This should improve with time. He has pain in shoulder and numbness in hand. 6. Expected acute postop blood loss anemia. Hgb 8.1 7. Nurse reports a couple blisters on buttocks probably due to edema and pressure. Turn frequently and observe.  8. DM: glucose underfaircontrol on Levemir and SSI.  Plan/Discussion:    He remains hemodynamically stable on present support and tolerating CRRT  well with volume removal. I think we should continue removing volume as long as CVP and wt are up and MAP is stable without increasing pressor requirement. Then will have to see if kidney function recovers. Could DC foley since anuric at this time.  INR 1.7 but just received first dose of 4 mg last night so not seeing effect of that yet. Will increase dose slightly to 5 mg daily.  Continue tube feeds for nutrition support, encourage po. PT, OT.  I reviewed the LVAD parameters from today, and compared the results to the patient's prior recorded data.  No programming changes were made.  The LVAD is functioning within specified parameters.  The patient performs LVAD self-test daily.  LVAD interrogation was negative for any significant power changes, alarms or PI events/speed drops.  LVAD equipment check completed and is in good working order.  Back-up equipment present.   LVAD education done on emergency procedures and precautions and reviewed exit site care.  Length of Stay: [address]  Fernande Boyden [facility] 10/20/2020, 8:14 AM

## 2020-10-20 NOTE — Progress Notes (Signed)
Patient ID: Marcus Mullen., male   DOB: [birth year], 60 y.o.   MRN: [id number] [facility] Progress Note   Assessment/ Plan:   1.  Acute kidney injury on chronic kidney disease stage IIIa: With underlying chronic kidney disease likely from diabetes/hypertension and worsening renal function from hemodynamically mediated ATN versus cardiorenal syndrome.  Continue CRRT at this time for extracorporeal volume unloading/clearance with reassessment of volume status/labs daily to direct further management including ability to transition to IHD. 2.  Acute exacerbation of chronic systolic heart failure: With evidence of cardiogenic shock status post LVAD.  Remains on pressors with norepinephrine, epinephrine and vasopressin along with inotropic support from dobutamine.  Tolerated ultrafiltration overnight with net -4.3 L. 3.  Anemia: Likely secondary to chronic disease and exacerbated by recent surgical losses. Continue to monitor for indications for PRBC transfusion. 4.  Atrial fibrillation: Intermittently tachycardic, continue amiodarone and resume anticoagulation when permitted.  Subjective:   Without acute events overnight and tolerating CRRT.   Objective:   BP (!) 150/139 (BP Location: Left Arm)   Pulse 95   Temp 97.9 F (36.6 C) (Axillary)   Resp 14   Ht 5' 10.5" (1.791 m)   Wt 96.2 kg   SpO2 92%   BMI 30.00 kg/m   Intake/Output Summary (Last 24 hours) at 10/20/2020 1448 Last data filed at 10/20/2020 0700 Gross per 24 hour  Intake 2743.69 ml  Output 7066 ml  Net -4322.31 ml   Weight change: -5.7 kg  Physical Exam: Gen: Resting comfortably in bed, interactive and appears in better spirits today. CVS: Irregular tachycardia, S1 and S2 normal Resp: Anteriorly clear to auscultation, diminished breath sounds over bases Abd: Soft, moderately distended, LVAD driveline in place Ext: 1+ lower extremity edema with 2+ upper extremity edema.  Some skin wrinkling  noted.  Imaging: DG CHEST PORT 1 VIEW  Result Date: 10/19/2020 CLINICAL DATA:  Chest pain EXAM: PORTABLE CHEST 1 VIEW COMPARISON:  October 18, 2020 FINDINGS: Central catheter tip is in the right atrium, slightly beyond the cavoatrial junction. Enteric tube tip is below the diaphragm. There is a left ventricular assist device. Pacemaker lead attached to right ventricle. No pneumothorax. There is persistent left lower lobe airspace opacity with small left pleural effusion. Right lung is clear. Stable cardiomegaly. Status post coronary artery bypass grafting. IMPRESSION: Tube and catheter positions as described without pneumothorax. Left ventricular assist device present. Left lower lobe atelectasis and airspace opacity. A degree of pneumonia in the left lower lobe is questioned. Small left pleural effusion. Right lung clear. Stable cardiac silhouette. Electronically Signed   By: Lowella Grip III M.D.   On: 10/19/2020 07:56   DG CHEST PORT 1 VIEW  Result Date: 10/18/2020 CLINICAL DATA:  Central line placement EXAM: PORTABLE CHEST 1 VIEW COMPARISON:  Earlier today FINDINGS: Dialysis catheter is in place on the right with tip near the upper cavoatrial junction. The enteric tube reaches the stomach. Stable defibrillator lead placement. LVAD. Stable vascular congestion with asymmetric hazy density on the left. No right apical pneumothorax or new mediastinal widening. Cardiomegaly. IMPRESSION: New dialysis catheter without complicating feature. Electronically Signed   By: Monte Fantasia M.D.   On: 10/18/2020 10:14    Labs: BMET Recent Labs  Lab 10/16/20 0435 10/17/20 0346 10/17/20 0348 10/18/20 0409 10/18/20 1538 10/19/20 0304 10/19/20 0324 10/19/20 1600 10/20/20 0255 10/20/20 0311  NA 138 138   < > 135 137 136 137 134* 134* 132*  K 3.9 3.8   < >  3.4* 3.5 3.8 3.7 3.8 4.2 4.0  CL 105 105  --  102 104 102  --  100  --  99  CO2 21* 21*  --  21* 20* 23  --  23  --  24  GLUCOSE 159* 75  --  191*  170* 120*  --  265*  --  252*  BUN 38* 45*  --  52* 47* 37*  --  31*  --  29*  CREATININE 2.54* 3.00*  --  3.50* 3.27* 2.73*  --  2.29*  --  2.04*  CALCIUM 8.0* 8.1*  --  7.5* 8.2* 8.0*  --  7.5*  --  7.4*  PHOS 4.6 4.7*  --  5.2* 4.7* 3.7  --  2.9  --  2.3*   < > = values in this interval not displayed.   CBC Recent Labs  Lab 10/17/20 0346 10/17/20 0348 10/18/20 0409 10/18/20 1538 10/19/20 0304 10/19/20 0324 10/20/20 0255 10/20/20 0310  WBC 27.2*  --  17.9* 17.6* 17.7*  --   --  PENDING  NEUTROABS 22.6*  --  14.7*  --  14.8*  --   --  PENDING  HGB 8.0*   < > 7.0* 8.1* 8.5* 9.2* 8.8* 8.1*  HCT 23.8*   < > 22.3* 26.0* 26.8* 27.0* 26.0* 25.3*  MCV 87.2  --  91.0 90.0 88.7  --   --  89.4  PLT 262  --  246 258 262  --   --  255   < > = values in this interval not displayed.    Medications:    . sodium chloride   Intravenous Once  . aspirin EC  325 mg Oral Daily   Or  . aspirin  324 mg Per Tube Daily   Or  . aspirin  300 mg Rectal Daily  . B-complex with vitamin C  1 tablet Oral Daily  . Chlorhexidine Gluconate Cloth  6 each Topical Daily  . feeding supplement  237 mL Oral BID BM  . feeding supplement (PROSource TF)  45 mL Per Tube TID  . insulin aspart  0-15 Units Subcutaneous Q4H  . insulin detemir  15 Units Subcutaneous BID  . lidocaine  1 patch Transdermal Q24H  . mouth rinse  15 mL Mouth Rinse BID  . metoCLOPramide (REGLAN) injection  5 mg Intravenous Q12H  . midodrine  10 mg Oral TID WC  . pantoprazole  40 mg Oral Daily  . polyethylene glycol  17 g Oral Daily  . senna-docusate  1 tablet Oral BID  . sodium chloride flush  10-40 mL Intracatheter Q12H  . sodium chloride flush  3 mL Intravenous Q12H  . warfarin  4 mg Oral q1600  . Warfarin - Physician Dosing Inpatient   Does not apply q1600   Elmarie Shiley, MD 10/20/2020, 7:21 AM

## 2020-10-20 NOTE — Progress Notes (Addendum)
Patient ID: Marcus Johnson., male   DOB: 11/10/1960, 60 y.o.   MRN: [id number]      Advanced Heart Failure Rounding Note  PCP-Cardiologist: No primary care provider on file.   Subjective:    - 2/27 Milrinone switched to dobutamine 5 mcg.  - 2/28 Moved to ICU. Dobutamine increased to 7.5 mcg and Norepi 3 mcg added. Diuretics held.  - 3/1 Impella 5.5 placed.  - 3/4 Teeth removed - 3/10 HM3 LVAD placed -3/13 VAD speed increased to 5500. [name] out  - 3/14 Nephrology consulted. Given 1UPRBCs  -3/15 Started CRRT. Given 1UPRBCs.   On milrinone 0.125+ epi 5+ amio 30.  CO-OX 59%    Hgb 8.1 today.  WBC 28-> 29>27.2->17.9 ->17.6K->pending   LDH 293>444>597>593>670>575 >571 >485   Remains on CRRT--weight down 5 pounds. No urine output over night.   CVP 18   Feeling ok. Denies SOB.   Echo: EF 20-25%, normal RV, PASP 61, dilated IVC.   LVAD Interrogation HM 3: Speed: 1093 Flow:5.1   PI:1.8   Power: 4.4  VAD interrogated personally.  25 PI events.   Objective:   Weight Range: 96.2 kg Body mass index is 30 kg/m.   Vital Signs:   Temp:  [96.2 F (35.7 C)-97.9 F (36.6 C)] 97.9 F (36.6 C) (03/16 1945) Pulse Rate:  [84-107] 95 (03/17 0600) Resp:  [8-21] 14 (03/17 0600) BP: (72-162)/(21-139) 150/139 (03/17 0600) SpO2:  [89 %-100 %] 92 % (03/17 0600) Arterial Line BP: (87)/(76) 87/76 (03/16 0800) Weight:  [96.2 kg] 96.2 kg (03/17 0500) Last BM Date: 10/12/20  Weight change: Filed Weights   10/18/20 0800 10/19/20 0500 10/20/20 0500  Weight: 101.9 kg 98.7 kg 96.2 kg    Intake/Output:   Intake/Output Summary (Last 24 hours) at 10/20/2020 0742 Last data filed at 10/20/2020 0700 Gross per 24 hour  Intake 2743.69 ml  Output 7066 ml  Net -4322.31 ml      Physical Exam  CVP 18 Physical Exam: GENERAL: No acute distress. HEENT: normal  NECK: Supple, JVP to jaw  .  2+ bilaterally, no bruits.  No lymphadenopathy or thyromegaly appreciated.   CARDIAC:  Mechanical  heart sounds with LVAD hum present.  LUNGS:  Clear to auscultation bilaterally.  ABDOMEN:  Soft, round, nontender, positive bowel sounds x4.     LVAD exit site:  Dressing dry and intact.  No erythema or drainage.  Stabilization device present and accurately applied.  EXTREMITIES:  Warm and dry, no cyanosis, clubbing, rash or edema . RUE PICC NEUROLOGIC:  Alert and oriented x 3.    No aphasia.  No dysarthria.  Affect pleasant.      Telemetry   SR 90s   Labs    CBC Recent Labs    10/19/20 0304 10/19/20 0324 10/20/20 0255 10/20/20 0310  WBC 17.7*  --   --  PENDING  NEUTROABS 14.8*  --   --  PENDING  HGB 8.5*   < > 8.8* 8.1*  HCT 26.8*   < > 26.0* 25.3*  MCV 88.7  --   --  89.4  PLT 262  --   --  255   < > = values in this interval not displayed.   Basic Metabolic Panel Recent Labs    10/19/20 0304 10/19/20 0324 10/19/20 1600 10/20/20 0255 10/20/20 0310 10/20/20 0311  NA 136   < > 134* 134*  --  132*  K 3.8   < > 3.8 4.2  --  4.0  CL 102  --  100  --   --  99  CO2 23  --  23  --   --  24  GLUCOSE 120*  --  265*  --   --  252*  BUN 37*  --  31*  --   --  29*  CREATININE 2.73*  --  2.29*  --   --  2.04*  CALCIUM 8.0*  --  7.5*  --   --  7.4*  MG 2.6*  --   --   --  2.3  --   PHOS 3.7  --  2.9  --   --  2.3*   < > = values in this interval not displayed.   Liver Function Tests Recent Labs    10/19/20 1600 10/20/20 0311  ALBUMIN 2.4* 2.2*   No results for input(s): LIPASE, AMYLASE in the last 72 hours. Cardiac Enzymes No results for input(s): CKTOTAL, CKMB, CKMBINDEX, TROPONINI in the last 72 hours.  BNP: BNP (last 3 results) Recent Labs    10/14/20 0254  BNP 1,097.8*    ProBNP (last 3 results) No results for input(s): PROBNP in the last 8760 hours.   D-Dimer No results for input(s): DDIMER in the last 72 hours. Hemoglobin A1C No results for input(s): HGBA1C in the last 72 hours. Fasting Lipid Panel No results for input(s): CHOL, HDL, LDLCALC,  TRIG, CHOLHDL, LDLDIRECT in the last 72 hours. Thyroid Function Tests No results for input(s): TSH, T4TOTAL, T3FREE, THYROIDAB in the last 72 hours.  Invalid input(s): FREET3  Other results:   Imaging    No results found.   Medications:     Scheduled Medications: . sodium chloride   Intravenous Once  . aspirin EC  325 mg Oral Daily   Or  . aspirin  324 mg Per Tube Daily   Or  . aspirin  300 mg Rectal Daily  . B-complex with vitamin C  1 tablet Oral Daily  . Chlorhexidine Gluconate Cloth  6 each Topical Daily  . feeding supplement  237 mL Oral BID BM  . feeding supplement (PROSource TF)  45 mL Per Tube TID  . insulin aspart  0-15 Units Subcutaneous Q4H  . insulin detemir  15 Units Subcutaneous BID  . lidocaine  1 patch Transdermal Q24H  . mouth rinse  15 mL Mouth Rinse BID  . metoCLOPramide (REGLAN) injection  5 mg Intravenous Q12H  . midodrine  10 mg Oral TID WC  . pantoprazole  40 mg Oral Daily  . polyethylene glycol  17 g Oral Daily  . senna-docusate  1 tablet Oral BID  . sodium chloride flush  10-40 mL Intracatheter Q12H  . sodium chloride flush  3 mL Intravenous Q12H  . warfarin  4 mg Oral q1600  . Warfarin - Physician Dosing Inpatient   Does not apply q1600    Infusions: .  prismasol BGK 4/2.5 400 mL/hr at 10/19/20 2315  .  prismasol BGK 4/2.5 200 mL/hr at 10/19/20 2315  . sodium chloride Stopped (10/16/20 1426)  . sodium chloride    . sodium chloride 20 mL/hr at 10/17/20 1715  . amiodarone 30 mg/hr (10/20/20 0242)  . epinephrine 5 mcg/min (10/20/20 0244)  . feeding supplement (VITAL 1.5 CAL) 1,000 mL (10/20/20 0030)  . lactated ringers    . lactated ringers Stopped (10/17/20 1655)  . milrinone 0.125 mcg/kg/min (10/20/20 0700)  . norepinephrine (LEVOPHED) Adult infusion Stopped (10/19/20 0700)  . prismasol BGK 4/2.5 1,500 mL/hr at  10/20/20 0539    PRN Medications: sodium chloride, dextrose, heparin, heparin, morphine injection, ondansetron (ZOFRAN)  IV, oxyCODONE, sodium chloride flush, sodium chloride flush, traMADol, white petrolatum    Assessment/Plan   1.Acute on chronic systolic HF -> cardiogenic shock:ICM.Has[employer] ICD.EF 15%. Low output persisted despite milrinone and then dobutamine with rise in creatinine to 2.5. Impella 5.5 placed 3/1.  Creatinine improved to 1.7 and HM3 LVAD was placed on 3/10.  -Patient currently on milrinone 0.125 +epi 5. CO-OX 59%  -  Continue midodrine 10 mg tid.  - Volume managed per CRRT  2. LVAD - 3/13 VAD speed increased to 5500. - ASA 325 until INR up to 2 then cut aspirin back to 81 mg daily.  INR 1.7 today.  - LDH- 293>444>597>593>670>575>571 >485  - Dosing  anticoagulation per CT surgery.   - DL site ok 3. CAD s/p CABG2002:Last cath in 6/19 with patent grafts.  - Continue Crestor 40 mg daily.  4. AKI on CKD Stage 3b: Suspect that this is a combination of cardiorenal syndrome and diabetic nephropathy and possibly ATN.  - creatinine trended back up post-op. Started on CRRT 3/15 - Nephrology appreciated. Discussed Patel at bedside.  5. Atrial flutter/fibrillation: s/p DC-CV 08/21/18.He has been in rate-controlled atrial fibrillation.  -Continue amio drip 30 mg per hour.  -On coumadin. INR 1.7. Per CT surgery  6. Wound R Foot: Partial thickness skin loss noted. Cover with mepilex border and change every 5 days.  7. Right arm pain: Think neuropathic, per Dr. Cyndia Bent patient had stretch of brachial plexus with Impella placement.  8. ID: WBC pending. Afebrile, negative cultures. Follow closely  9. AU:QJFHLKT of very poor control but hgbA1ctrending down. Most recent 7.7%  - Cover with SSI. 10. Anemia  Hgb stable today at 8.1 11. Deconditioning PT  following. Consult OT.     Length of Stay: [place], NP  10/20/2020, 7:42 AM  Advanced Heart Failure Team Pager [phone] (M-F; 7a - 4p)  Please contact [facility] for night-coverage after hours (4p -7a ) and weekends  on [email or web address]  Agree with above  On milrinone 0.125+ epi 5+ amio 30.  CO-OX 59%  On CVVHD weight down 5L overnight. CVP 18.   Feel weak but breathing a bit better. No BM yet. INR 1.7.  RUE still numb  VAD interrogated personally. Multiple PI events but parameters otherwise stable.  General:  Sitting in chair. Weak appearing. NAD.  HEENT: normal  Neck: supple. JVP to jaw.  Carotids 2+ bilat; no bruits. No lymphadenopathy or thryomegaly appreciated. Cor: LVAD hum.  Lungs: Clear. Abdomen: obese soft, nontender, + distended. No hepatosplenomegaly. No bruits or masses. Good bowel sounds. Driveline site clean. Anchor in place.  Extremities: no cyanosis, clubbing, rash. Warm 2-3+ edema  Neuro: alert & oriented x 3. No focal deficits. Moves all 4 without problem   Remains tenuous but improving with volume removal. Continue CVVHD. Co-ox marginal but adequate. Would not wean inotropes until fluid status improved. Will attempt to mobilize. Continue aggressive bowel regimen.   Glori Bickers, MD  5:44 PM

## 2020-10-21 DIAGNOSIS — I5023 Acute on chronic systolic (congestive) heart failure: Secondary | ICD-10-CM | POA: Diagnosis not present

## 2020-10-21 DIAGNOSIS — Z95811 Presence of heart assist device: Secondary | ICD-10-CM | POA: Diagnosis not present

## 2020-10-21 LAB — RENAL FUNCTION PANEL
Albumin: 2.5 g/dL — ABNORMAL LOW (ref 3.5–5.0)
Albumin: 2.3 g/dL — ABNORMAL LOW (ref 3.5–5.0)
Anion gap: 10 (ref 5–15)
Anion gap: 9 (ref 5–15)
BUN: 26 mg/dL — ABNORMAL HIGH (ref 6–20)
BUN: 27 mg/dL — ABNORMAL HIGH (ref 6–20)
CO2: 25 mmol/L (ref 22–32)
CO2: 24 mmol/L (ref 22–32)
Calcium: 8.3 mg/dL — ABNORMAL LOW (ref 8.9–10.3)
Calcium: 7.4 mg/dL — ABNORMAL LOW (ref 8.9–10.3)
Chloride: 98 mmol/L (ref 98–111)
Chloride: 99 mmol/L (ref 98–111)
Creatinine, Ser: 1.7 mg/dL — ABNORMAL HIGH (ref 0.61–1.24)
Creatinine, Ser: 1.87 mg/dL — ABNORMAL HIGH (ref 0.61–1.24)
GFR, Estimated: 46 mL/min — ABNORMAL LOW (ref >60)
GFR, Estimated: 41 mL/min — ABNORMAL LOW (ref >60)
Glucose, Bld: 235 mg/dL — ABNORMAL HIGH (ref 70–99)
Glucose, Bld: 240 mg/dL — ABNORMAL HIGH (ref 70–99)
Phosphorus: 1.7 mg/dL — ABNORMAL LOW (ref 2.5–4.6)
Phosphorus: 4.9 mg/dL — ABNORMAL HIGH (ref 2.5–4.6)
Potassium: 4.6 mmol/L (ref 3.5–5.1)
Potassium: 5.3 mmol/L — ABNORMAL HIGH (ref 3.5–5.1)
Sodium: 133 mmol/L — ABNORMAL LOW (ref 135–145)
Sodium: 132 mmol/L — ABNORMAL LOW (ref 135–145)

## 2020-10-21 LAB — COOXEMETRY PANEL
Carboxyhemoglobin: 1.5 % (ref 0.5–1.5)
Carboxyhemoglobin: 1.5 % (ref 0.5–1.5)
Methemoglobin: 0.5 % (ref 0.0–1.5)
Methemoglobin: 0.8 % (ref 0.0–1.5)
O2 Saturation: 48.2 %
O2 Saturation: 55 %
Total hemoglobin: 8.6 g/dL — ABNORMAL LOW (ref 12.0–16.0)
Total hemoglobin: 11.8 g/dL — ABNORMAL LOW (ref 12.0–16.0)

## 2020-10-21 LAB — CBC
HCT: 26.6 % — ABNORMAL LOW (ref 39.0–52.0)
Hemoglobin: 8.4 g/dL — ABNORMAL LOW (ref 13.0–17.0)
MCH: 28.3 pg (ref 26.0–34.0)
MCHC: 31.6 g/dL (ref 30.0–36.0)
MCV: 89.6 fL (ref 80.0–100.0)
Platelets: 323 10*3/uL (ref 150–400)
RBC: 2.97 MIL/uL — ABNORMAL LOW (ref 4.22–5.81)
RDW: 18.6 % — ABNORMAL HIGH (ref 11.5–15.5)
WBC: 15.9 10*3/uL — ABNORMAL HIGH (ref 4.0–10.5)
nRBC: 1.4 % — ABNORMAL HIGH (ref 0.0–0.2)

## 2020-10-21 LAB — GLUCOSE, CAPILLARY
Glucose-Capillary: 181 mg/dL — ABNORMAL HIGH (ref 70–99)
Glucose-Capillary: 112 mg/dL — ABNORMAL HIGH (ref 70–99)
Glucose-Capillary: 199 mg/dL — ABNORMAL HIGH (ref 70–99)
Glucose-Capillary: 190 mg/dL — ABNORMAL HIGH (ref 70–99)
Glucose-Capillary: 199 mg/dL — ABNORMAL HIGH (ref 70–99)

## 2020-10-21 LAB — MAGNESIUM: Magnesium: 2.6 mg/dL — ABNORMAL HIGH (ref 1.7–2.4)

## 2020-10-21 LAB — PROTIME-INR
INR: 1.6 — ABNORMAL HIGH (ref 0.8–1.2)
Prothrombin Time: 18.5 seconds — ABNORMAL HIGH (ref 11.4–15.2)

## 2020-10-21 LAB — HEPARIN LEVEL (UNFRACTIONATED): Heparin Unfractionated: <0.1 IU/mL — ABNORMAL LOW (ref 0.30–0.70)

## 2020-10-21 LAB — LACTATE DEHYDROGENASE: LDH: 467 U/L — ABNORMAL HIGH (ref 98–192)

## 2020-10-21 MED ORDER — SODIUM CHLORIDE 0.9 % IV SOLN
400.0000 [IU]/h | INTRAVENOUS | Status: DC
  Administered 2020-10-21 – 2020-10-23 (×2): 400 [IU]/h via INTRAVENOUS_CENTRAL
  Filled 2020-10-21 (×2): qty 2

## 2020-10-21 MED ORDER — POTASSIUM PHOSPHATES 15 MMOLE/5ML IV SOLN
20.0000 mmol | Freq: Once | INTRAVENOUS | Status: AC
  Administered 2020-10-21: 20 mmol via INTRAVENOUS
  Filled 2020-10-21: qty 6.67

## 2020-10-21 MED ORDER — WARFARIN SODIUM 5 MG PO TABS
5.0000 mg | ORAL_TABLET | Freq: Once | ORAL | Status: AC
  Administered 2020-10-21: 5 mg via ORAL
  Filled 2020-10-21: qty 1

## 2020-10-21 MED ORDER — WARFARIN - PHARMACIST DOSING INPATIENT
Freq: Every day | Status: DC
  Administered 2020-10-22 – 2020-10-24 (×2): 1

## 2020-10-21 MED ORDER — HEPARIN BOLUS VIA INFUSION (CRRT)
1000.0000 [IU] | INTRAVENOUS | Status: DC | PRN
  Filled 2020-10-21: qty 1000

## 2020-10-21 MED ORDER — ENSURE ENLIVE PO LIQD
237.0000 mL | Freq: Three times a day (TID) | ORAL | Status: DC
  Administered 2020-10-21 – 2020-11-10 (×40): 237 mL via ORAL

## 2020-10-21 NOTE — Progress Notes (Signed)
Inpatient Diabetes Program Recommendations  AACE/ADA: New Consensus Statement on Inpatient Glycemic Control (2015)  Target Ranges:  Prepandial:   less than 140 mg/dL      Peak postprandial:   less than 180 mg/dL (1-2 hours)      Critically ill patients:  140 - 180 mg/dL   Lab Results  Component Value Date   GLUCAP 112 (H) 10/21/2020   HGBA1C 7.8 (H) 10/12/2020    Review of Glycemic Control Results for Marcus Johnson, Marcus Johnson (MRN [id number]) as of 10/21/2020 10:35  Ref. Range 10/20/2020 15:39 10/20/2020 19:42 10/20/2020 23:46 10/21/2020 03:54 10/21/2020 07:45  Glucose-Capillary Latest Ref Range: 70 - 99 mg/dL 250 (H) 183 (H) 179 (H) 181 (H) 112 (H)  Diabetes history:DM2 Outpatient Diabetes medications:Jardiance 10 mg daily, Novolog 5 units TID with meals, Lantus 50 units QHS Current orders for Inpatient glycemic control:  Novolog moderate q 4 hours Levemir 15 units bid  Inpatient Diabetes Program Recommendations:    Consider increasing AM dose of Levemir to 20 units.    Thanks  Adah Perl, RN, BC-ADM Inpatient Diabetes Coordinator Pager [phone] (8a-5p)

## 2020-10-21 NOTE — Progress Notes (Signed)
Brief Nutrition Follow Up  Patient feels he is eating more each day. Consumed 50% of his bacon, eggs, and potatoes this am without complication. Averaging two Ensures daily. Plan to hold tube feeding over the weekend but keep Cortrak in the case patient does not meet nutrition needs PO. Patient willing to increase Ensure supplements to three daily.   RD to check in on patient Monday to see if Cortrak can come out. Nursing to continue to encourage PO and supplement intake.   Mariana Single RD, LDN Clinical Nutrition Pager listed in [place]

## 2020-10-21 NOTE — Plan of Care (Signed)

## 2020-10-21 NOTE — Progress Notes (Signed)
[facility] NEPHROLOGY PROGRESS NOTE  Assessment/ Plan:  # Acute kidney injury on chronic kidney disease stage IIIa: With underlying chronic kidney disease likely from diabetes/hypertension and worsening renal function from hemodynamically mediated ATN versus cardiorenal syndrome.   Tolerating CRRT well, able to UF around 200 cc/per hour.  CVP around 15 therefore plan to continue CRRT for extracorporeal volume unloading/clearance with reassessment of volume status/labs daily to direct further management including ability to transition to IHD.  Need to start low-dose heparin because of clotting filter.  Discussed with RN and the pharmacist.  # Acute exacerbation of chronic systolic heart failure:With evidence of cardiogenic shock status post LVAD. Remains on pressors with epinephrine, milrinone.  Volume status/UF managed with CRRT.  # Anemia:Likely secondary to chronic disease and exacerbated by recent surgical losses.  Transfuse PRBC as needed.  # Atrial fibrillation: Intermittently tachycardic, continue amiodarone and on Coumadin   #Hypophosphatemia: Replete IV phosphate.  Monitor lab.  Subjective: Seen and examined in ICU.  Patient is alert awake.  Tolerating CRRT with ultrafiltration.  On pressors.  Discussed with ICU staff, pharmacist. Objective Vital signs in last 24 hours: Vitals:   10/21/20 0405 10/21/20 0500 10/21/20 0600 10/21/20 0748  BP:      Pulse:  92 89   Resp:  16 13   Temp: 98.2 F (36.8 C)   98.2 F (36.8 C)  TempSrc: Oral   Axillary  SpO2:  (!) 86% 97%   Weight:      Height:       Weight change: -7 kg  Intake/Output Summary (Last 24 hours) at 10/21/2020 0754 Last data filed at 10/21/2020 0700 Gross per 24 hour  Intake 3243.96 ml  Output 8050 ml  Net -4806.04 ml       Labs: Basic Metabolic Panel: Recent Labs  Lab 10/20/20 0311 10/20/20 1742 10/21/20 0416  NA 132* 130* 133*  K 4.0 4.7 4.6  CL 99 95* 98  CO2 24 24 25   GLUCOSE 252*  338* 235*  BUN 29* 27* 26*  CREATININE 2.04* 1.83* 1.70*  CALCIUM 7.4* 7.8* 8.3*  PHOS 2.3* 3.3 1.7*   Liver Function Tests: Recent Labs  Lab 10/15/20 0410 10/16/20 0435 10/18/20 1538 10/20/20 0311 10/20/20 1742 10/21/20 0416  AST 188* 145*  --   --   --   --   ALT 38 51*  --   --   --   --   ALKPHOS 71 82  --   --   --   --   BILITOT 3.5* 3.1*  --   --   --   --   PROT 6.0* 6.2*  --   --   --   --   ALBUMIN 2.6* 2.4*   < > 2.2* 2.4* 2.5*   < > = values in this interval not displayed.   No results for input(s): LIPASE, AMYLASE in the last 168 hours. No results for input(s): AMMONIA in the last 168 hours. CBC: Recent Labs  Lab 10/17/20 0346 10/17/20 0348 10/18/20 0409 10/18/20 1538 10/19/20 0304 10/19/20 0324 10/20/20 0255 10/20/20 0310  WBC 27.2*  --  17.9* 17.6* 17.7*  --   --  13.8*  NEUTROABS 22.6*  --  14.7*  --  14.8*  --   --  11.3*  HGB 8.0*   < > 7.0* 8.1* 8.5* 9.2* 8.8* 8.1*  HCT 23.8*   < > 22.3* 26.0* 26.8* 27.0* 26.0* 25.3*  MCV 87.2  --  91.0 90.0  88.7  --   --  89.4  PLT 262  --  246 258 262  --   --  255   < > = values in this interval not displayed.   Cardiac Enzymes: No results for input(s): CKTOTAL, CKMB, CKMBINDEX, TROPONINI in the last 168 hours. CBG: Recent Labs  Lab 10/20/20 1140 10/20/20 1539 10/20/20 1942 10/20/20 2346 10/21/20 0354  GLUCAP 200* 250* 183* 179* 181*    Iron Studies: No results for input(s): IRON, TIBC, TRANSFERRIN, FERRITIN in the last 72 hours. Studies/Results: DG CHEST PORT 1 VIEW  Result Date: 10/20/2020 CLINICAL DATA:  Weakness.  Left ventricular assist device present. EXAM: PORTABLE CHEST 1 VIEW COMPARISON:  October 19, 2020 FINDINGS: Central catheter tip in right atrium, stable. Enteric tube tip below diaphragm. Pacemaker lead attached to right ventricle. Left ventricular assist device present. Temporary pacemaker wire attached to right heart. No pneumothorax. There is a small left pleural effusion with apparent  atelectasis in the left lower lobe. Right lung is clear. Stable cardiomegaly with pulmonary vascularity within normal limits. No evident adenopathy. No bone lesions. IMPRESSION: Tube and catheter positions as described without pneumothorax. Small left pleural effusion with left lower lobe atelectatic change. No new opacity evident. Stable cardiomegaly. Electronically Signed   By: Lowella Grip III M.D.   On: 10/20/2020 08:21    Medications: Infusions: .  prismasol BGK 4/2.5 400 mL/hr at 10/21/20 0117  . sodium chloride Stopped (10/16/20 1426)  . sodium chloride    . sodium chloride 20 mL/hr at 10/17/20 1715  . amiodarone 30 mg/hr (10/21/20 0700)  . epinephrine 4 mcg/min (10/21/20 0700)  . lactated ringers    . lactated ringers Stopped (10/17/20 1655)  . milrinone 0.125 mcg/kg/min (10/21/20 0700)  . prismasol BGK 2/2.5 replacement solution 200 mL/hr at 10/20/20 1926  . prismasol BGK 4/2.5 1,500 mL/hr at 10/21/20 7062    Scheduled Medications: . sodium chloride   Intravenous Once  . aspirin EC  325 mg Oral Daily   Or  . aspirin  324 mg Per Tube Daily   Or  . aspirin  300 mg Rectal Daily  . B-complex with vitamin C  1 tablet Oral Daily  . Chlorhexidine Gluconate Cloth  6 each Topical Daily  . feeding supplement  237 mL Oral BID BM  . feeding supplement (PROSource TF)  45 mL Per Tube TID  . feeding supplement (VITAL 1.5 CAL)  1,000 mL Per Tube Q24H  . gabapentin  100 mg Oral QHS  . insulin aspart  0-15 Units Subcutaneous Q4H  . insulin detemir  15 Units Subcutaneous BID  . lidocaine  1 patch Transdermal Q24H  . mouth rinse  15 mL Mouth Rinse BID  . metoCLOPramide (REGLAN) injection  5 mg Intravenous Q12H  . midodrine  10 mg Oral TID WC  . pantoprazole  40 mg Oral Daily  . polyethylene glycol  17 g Oral Daily  . senna-docusate  2 tablet Oral BID  . sodium chloride flush  10-40 mL Intracatheter Q12H  . sodium chloride flush  3 mL Intravenous Q12H  . warfarin  5 mg Oral q1600   . Warfarin - Physician Dosing Inpatient   Does not apply q1600    have reviewed scheduled and prn medications.  Physical Exam: General:NAD, comfortable Heart:RRR, s1s2 nl Lungs: Coarse breath sound bilateral Abdomen:soft, Non-tender, non-distended Extremities:LE edema+ Dialysis Access: Temporary HD catheter.  Daurice Ovando Prasad Oluwatosin Higginson 10/21/2020,7:54 AM  LOS: 23 days

## 2020-10-21 NOTE — Progress Notes (Signed)
Patient ID: Marcus Johnson., male   DOB: [birth year], 60 y.o.   MRN: [id number] HeartMate 3 Rounding Note  Subjective:    Feels ok this am. Only complaint is of butt hurting. He stood a couple times to get to chair with PT. Dizzy standing up. Dressing on butt changed and there are broken blisters on both cheeks.  Hemodynamics have been stable on milrinone 0.125, epi 4. Co-ox 55 this am. CVP 18.  CRRT: -4800 cc/24 hrs with wt down about 15 lbs from yesterday. He is below preop wt. LVAD INTERROGATION:  HeartMate IIl LVAD:  Flow 5.3 liters/min, speed 5500, power 4.2, PI 2.5.    Objective:    Vital Signs:   Temp:  [97.5 F (36.4 C)-98.2 F (36.8 C)] 98.2 F (36.8 C) (03/18 0748) Pulse Rate:  [83-96] 89 (03/18 0600) Resp:  [9-23] 13 (03/18 0600) BP: (80-132)/(66-103) 91/70 (03/18 0202) SpO2:  [86 %-100 %] 97 % (03/18 0600) Weight:  [89.2 kg] 89.2 kg (03/18 0400) Last BM Date: 10/20/20 Mean arterial Pressure 76 this am.  Intake/Output:   Intake/Output Summary (Last 24 hours) at 10/21/2020 0850 Last data filed at 10/21/2020 0800 Gross per 24 hour  Intake 3391.28 ml  Output 8043 ml  Net -4651.72 ml     Physical Exam: General: Fatigued,  No resp difficulty HEENT: normal Neck: HD cath Cor: distant heart sounds with LVAD hum present. Lungs: clear Abdomen: soft, nontender, nondistended.  Good bowel sounds. Extremities: minimal edema Neuro: alert & orientedx3, cranial nerves grossly intact. moves all 4 extremities w/o difficulty. Affect pleasant  Telemetry: AF 90  Labs: Basic Metabolic Panel: Recent Labs  Lab 10/17/20 0346 10/17/20 0348 10/18/20 0409 10/18/20 1538 10/19/20 0304 10/19/20 0324 10/19/20 1600 10/20/20 0255 10/20/20 0310 10/20/20 0311 10/20/20 1742 10/21/20 0416  NA 138   < > 135   < > 136   < > 134* 134*  --  132* 130* 133*  K 3.8   < > 3.4*   < > 3.8   < > 3.8 4.2  --  4.0 4.7 4.6  CL 105  --  102   < > 102  --  100  --   --  99 95* 98  CO2  21*  --  21*   < > 23  --  23  --   --  24 24 25   GLUCOSE 75  --  191*   < > 120*  --  265*  --   --  252* 338* 235*  BUN 45*  --  52*   < > 37*  --  31*  --   --  29* 27* 26*  CREATININE 3.00*  --  3.50*   < > 2.73*  --  2.29*  --   --  2.04* 1.83* 1.70*  CALCIUM 8.1*  --  7.5*   < > 8.0*  --  7.5*  --   --  7.4* 7.8* 8.3*  MG 2.6*  --  2.4  --  2.6*  --   --   --  2.3  --   --  2.6*  PHOS 4.7*  --  5.2*   < > 3.7  --  2.9  --   --  2.3* 3.3 1.7*   < > = values in this interval not displayed.    Liver Function Tests: Recent Labs  Lab 10/15/20 0410 10/16/20 0435 10/18/20 1538 10/19/20 0304 10/19/20 1600 10/20/20 0311 10/20/20 1742 10/21/20 0416  AST 188* 145*  --   --   --   --   --   --   ALT 38 51*  --   --   --   --   --   --   ALKPHOS 71 82  --   --   --   --   --   --   BILITOT 3.5* 3.1*  --   --   --   --   --   --   PROT 6.0* 6.2*  --   --   --   --   --   --   ALBUMIN 2.6* 2.4*   < > 2.4* 2.4* 2.2* 2.4* 2.5*   < > = values in this interval not displayed.   No results for input(s): LIPASE, AMYLASE in the last 168 hours. No results for input(s): AMMONIA in the last 168 hours.  CBC: Recent Labs  Lab 10/16/20 0435 10/17/20 0346 10/17/20 0348 10/18/20 0409 10/18/20 1538 10/19/20 0304 10/19/20 0324 10/20/20 0255 10/20/20 0310  WBC 29.4* 27.2*  --  17.9* 17.6* 17.7*  --   --  13.8*  NEUTROABS 24.4* 22.6*  --  14.7*  --  14.8*  --   --  11.3*  HGB 7.3* 8.0*   < > 7.0* 8.1* 8.5* 9.2* 8.8* 8.1*  HCT 22.7* 23.8*   < > 22.3* 26.0* 26.8* 27.0* 26.0* 25.3*  MCV 87.6 87.2  --  91.0 90.0 88.7  --   --  89.4  PLT 232 262  --  246 258 262  --   --  255   < > = values in this interval not displayed.    INR: Recent Labs  Lab 10/17/20 0346 10/18/20 0409 10/19/20 0304 10/20/20 0310 10/21/20 0416  INR 1.7* 1.8* 1.7* 1.7* 1.6*    Other results:  EKG:   Imaging: DG CHEST PORT 1 VIEW  Result Date: 10/20/2020 CLINICAL DATA:  Weakness.  Left ventricular assist  device present. EXAM: PORTABLE CHEST 1 VIEW COMPARISON:  October 19, 2020 FINDINGS: Central catheter tip in right atrium, stable. Enteric tube tip below diaphragm. Pacemaker lead attached to right ventricle. Left ventricular assist device present. Temporary pacemaker wire attached to right heart. No pneumothorax. There is a small left pleural effusion with apparent atelectasis in the left lower lobe. Right lung is clear. Stable cardiomegaly with pulmonary vascularity within normal limits. No evident adenopathy. No bone lesions. IMPRESSION: Tube and catheter positions as described without pneumothorax. Small left pleural effusion with left lower lobe atelectatic change. No new opacity evident. Stable cardiomegaly. Electronically Signed   By: Lowella Grip III M.D.   On: 10/20/2020 08:21      Medications:     Scheduled Medications: . sodium chloride   Intravenous Once  . aspirin EC  325 mg Oral Daily   Or  . aspirin  324 mg Per Tube Daily   Or  . aspirin  300 mg Rectal Daily  . B-complex with vitamin C  1 tablet Oral Daily  . Chlorhexidine Gluconate Cloth  6 each Topical Daily  . feeding supplement  237 mL Oral BID BM  . feeding supplement (PROSource TF)  45 mL Per Tube TID  . feeding supplement (VITAL 1.5 CAL)  1,000 mL Per Tube Q24H  . gabapentin  100 mg Oral QHS  . insulin aspart  0-15 Units Subcutaneous Q4H  . insulin detemir  15 Units Subcutaneous BID  . lidocaine  1 patch Transdermal  Q24H  . mouth rinse  15 mL Mouth Rinse BID  . metoCLOPramide (REGLAN) injection  5 mg Intravenous Q12H  . midodrine  10 mg Oral TID WC  . pantoprazole  40 mg Oral Daily  . polyethylene glycol  17 g Oral Daily  . senna-docusate  2 tablet Oral BID  . sodium chloride flush  10-40 mL Intracatheter Q12H  . sodium chloride flush  3 mL Intravenous Q12H  . warfarin  5 mg Oral q1600  . Warfarin - Physician Dosing Inpatient   Does not apply q1600     Infusions: .  prismasol BGK 4/2.5 400 mL/hr at  10/21/20 0117  . sodium chloride Stopped (10/16/20 1426)  . sodium chloride    . sodium chloride 20 mL/hr at 10/17/20 1715  . amiodarone 30 mg/hr (10/21/20 0800)  . epinephrine 4 mcg/min (10/21/20 0800)  . heparin 10,000 units/ 20 mL infusion syringe    . lactated ringers    . lactated ringers Stopped (10/17/20 1655)  . milrinone 0.125 mcg/kg/min (10/21/20 0800)  . potassium PHOSPHATE IVPB (in mmol)    . prismasol BGK 2/2.5 replacement solution 200 mL/hr at 10/20/20 1926  . prismasol BGK 4/2.5 1,500 mL/hr at 10/21/20 0523     PRN Medications:  sodium chloride, dextrose, heparin, heparin, heparin, morphine injection, ondansetron (ZOFRAN) IV, oxyCODONE, sodium chloride flush, sodium chloride flush, traMADol, white petrolatum   Assessment:   1. Acute on chronic systolic congestive heart failure due to ischemic cardiomyopathy with ejection fraction of 20 to 25% admitted with class IIIb symptoms. [id number] LVAD.  2. CAD s/p CABG 2002 with patent grafts on last cath.  3. Stage 3 CKD due to diabetic nephropathy and cardiorenal syndrome. Baseline recently 1.8.CRRT removing volume. He is now down below preop wt of 200. 4. Atrial fib/flutter:Sinus alternating with AFon amio IV.  5. Right brachial plexus stretch from preop Impella and hematoma around the axillary artery. This should improve with time. He has pain in shoulder and numbness in hand. 6. Expected acute postop blood loss anemia.Hgb pending this am 7. Blisters on buttocks probably due to edema and pressure. Turn frequently and observe.  8. DM: glucose underfaircontrol on Levemir and SSI.  Plan/Discussion:     He has been hemodynamically stable on present support but MAP is lower this am and we may have to back off on volume removal rate or resume vasopressor. Discuss with heart failure team. CVP is still up if accurate.  INR stalled. Will need to increase dose a little. Pharmacy will take that over today. Tube feeds and  supplements likely contributing to increased Coumadin need. Low dose heparin to start for CRRT.  Continue PT, IS.   If he eats well today we can probably get off tube feeds.  I reviewed the LVAD parameters from today, and compared the results to the patient's prior recorded data.  No programming changes were made.  The LVAD is functioning within specified parameters.  The patient performs LVAD self-test daily.  LVAD interrogation was negative for any significant power changes, alarms or PI events/speed drops.  LVAD equipment check completed and is in good working order.  Back-up equipment present.   LVAD education done on emergency procedures and precautions and reviewed exit site care.  Length of Stay: [address]  Fernande Boyden [facility] 10/21/2020, 8:50 AM

## 2020-10-21 NOTE — Progress Notes (Signed)
LVAD Coordinator Rounding Note:  Admitted 09/28/20 due to worsening heart failure symptoms. Recent COVID infection January 2022; did not take medications for 3-4 weeks.   S/p dental extractions 10/07/20 per Dr Benson Norway.  HM III LVAD implanted on 10/13/20 by Dr Cyndia Bent under Destination Therapy criteria.  Pt sitting on the side of the bed working with PT this morning.   CRRT started 3/15, nurse reports they have been pulling 200 cc/hr without issues. BP has remained stable, Vaso off, Levo off, Milrinone weaned to 0.125. A-line dc'd. Had BM yesterday.  Vital signs: Temp: 98.2 HR: 89 Doppler: 76 Auto cuff: 85 O2 Sat: 97% on RA Wt: 200.1>221.7>219.5>212.7>223>224.6>217.5>212>196.6 lbs     LVAD interrogation reveals:  Speed: 5500 Flow: 5.5 Power: 4.1w PI: 1.7 Hematocrit: 25  Alarms: none Events: 1 PI event today; 8 yesterday  Fixed speed: 5500 Low speed limit: 5200  Drive Line: Existing VAD dressing removed and site care performed using sterile technique. Drive line exit site cleaned with Chlora prep applicators x 2, allowed to dry, and gauze dressing with silver strip re-applied. Exit site healing and unincorporated the velour is fully implanted at exit site. There is 1 suture visible that is wrapped around the driveline. Moderate amount of serousanguineous drainage on gauze dressing. No redness, tenderness, foul odor or rash noted. Few inches of drive line from exit site to modular cable connection, making positioning/anchoring of drive line difficult. Drive line anchor correctly applied. Continue daily dressing changes by VAD coordinator or nurse champion. Next dressing change is due 10/22/20.         Labs:  LDH trend: 444>670>575>571>485>467  INR trend: 1.4>1.7>1.8>1.7>1.6  Creatinine: 1.84>3.0>3.4>2.73>2.04>1.70  Anticoagulation Plan: -INR Goal: 2.0-2.5 -ASA Dose: 325 mg until INR therapeutic   Blood Products:  IntraOp:   1 Platelet  6 FFP  3 PRBC  1035 cell  saver Postop:   10/13/20:   4 FFP  4 PRBC    10/14/20:  1 FFP  1 PRBC   10/16/20:  1 PRBC   10/18/20:  1 PRBC  Device: - Medtronic single -Therapies: off   Arrythmias: VT in OR requiring defibrillation   Renal: CRRT started 10/19/20  Drips: Epinephrine 4 mcg/min Milrinone 0.125 mcg/kg/min Amiodarone 30 mg/hr  Patient Education: 1. Patient in pain this morning up in the chair - no family at bedside. Education inappropriate at this time.   Plan/Recommendations:  1. Call VAD coordinator for equipment or drive line issues 2. Daily drive line dressing change per VAD coordinator or nurse champion. Next dressing change due 10/22/20  Tanda Rockers RN [place] Coordinator  Office: [phone]  24/7 Pager: [phone]

## 2020-10-21 NOTE — Progress Notes (Signed)
Physical Therapy Treatment Patient Details Name: Marcus Johnson. MRN: [id number] DOB: 1961/05/16 Today's Date: 10/21/2020    History of Present Illness 60 y.o. male admitted 2/28 with ICM for LVAD workup. Impella placed 3/1. Heartmate 3 LVAD placed 3/10. CRRT started 3/15. PMHx: DM2, CAD s/p CABG 7494, systolic HF due to ischemic cardiomyopathy with EF 20-25% (echo 12/15), DM2 and CKD. He is s/p Medtronic single chamber ICD. Covid 1/22    PT Comments    Pt pleasant with improved cognition and able to recall staff names but still struggling to recall sternal precautions and LVAD parts. Pt with improved standing ability and able to weight shift and march in standing. Pt limited by dizziness in standing and unable to get accurate MAP or automatic BP once sitting.  Hr 90-92 Speed 5500, flow 5.4, PI 2.2, power 4.1    Follow Up Recommendations  Supervision/Assistance - 24 hour;CIR     Equipment Recommendations  Other (comment) (TBD)    Recommendations for Other Services       Precautions / Restrictions Precautions Precautions: Fall;Sternal Precaution Comments: LVAD, CRRT, multiple lines    Mobility  Bed Mobility Overal bed mobility: Needs Assistance Bed Mobility: Rolling;Sidelying to Sit Rolling: Mod assist Sidelying to sit: Min assist       General bed mobility comments: cues for sequence with assist of pad to roll to right with cues for knee flexion, min assist to lift trunk from 30 degree HOB    Transfers Overall transfer level: Needs assistance   Transfers: Sit to/from Stand;Stand Pivot Transfers Sit to Stand: Mod assist;+2 physical assistance Stand pivot transfers: Min assist;+2 physical assistance       General transfer comment: pt able to rise from bed and recliner with mod +2 assist of pad at sacrum with cues for sequence and precautions. Pivot bed to chair with min +2 assist with bil Ue support and pt able to move both feet. on 2nd stand pt able to stand  and march x 15 bil LE  Ambulation/Gait             General Gait Details: not yet able   Stairs             Wheelchair Mobility    Modified Rankin (Stroke Patients Only)       Balance Overall balance assessment: Needs assistance Sitting-balance support: No upper extremity supported;Feet supported Sitting balance-Leahy Scale: Fair Sitting balance - Comments: EOb without physical assist   Standing balance support: Bilateral upper extremity supported Standing balance-Leahy Scale: Poor Standing balance comment: bil UE support in standing limited by dizziness                            Cognition Arousal/Alertness: Awake/alert Behavior During Therapy: Flat affect Overall Cognitive Status: Impaired/Different from baseline Area of Impairment: Memory                     Memory: Decreased short-term memory;Decreased recall of precautions Following Commands: Follows one step commands consistently     Problem Solving: Slow processing General Comments: pt able to recall therapisty and tech names today and state 3/4 sternal precautions, still unable to recall parts of LVAd equipment      Exercises General Exercises - Lower Extremity Long Arc Quad: AAROM;Both;15 reps;Seated Hip Flexion/Marching: AROM;Both;10 reps;Seated    General Comments        Pertinent Vitals/Pain Pain Assessment: No/denies pain    Home Living  Prior Function            PT Goals (current goals can now be found in the care plan section) Progress towards PT goals: Progressing toward goals    Frequency    Min 4X/week      PT Plan Current plan remains appropriate    Co-evaluation              AM-PAC PT "6 Clicks" Mobility   Outcome Measure  Help needed turning from your back to your side while in a flat bed without using bedrails?: A Lot Help needed moving from lying on your back to sitting on the side of a flat bed without  using bedrails?: A Little Help needed moving to and from a bed to a chair (including a wheelchair)?: A Lot Help needed standing up from a chair using your arms (e.g., wheelchair or bedside chair)?: A Lot Help needed to walk in hospital room?: Total Help needed climbing 3-5 steps with a railing? : Total 6 Click Score: 11    End of Session   Activity Tolerance: Patient tolerated treatment well Patient left: in chair;with call bell/phone within reach;with nursing/sitter in room Nurse Communication: Mobility status;Precautions PT Visit Diagnosis: Unsteadiness on feet (R26.81);Other abnormalities of gait and mobility (R26.89);Muscle weakness (generalized) (M62.81);Difficulty in walking, not elsewhere classified (R26.2)     Time: 4650-3546 PT Time Calculation (min) (ACUTE ONLY): 38 min  Charges:  $Therapeutic Exercise: 8-22 mins $Therapeutic Activity: 23-37 mins                     Clayton Jarmon P, PT Acute Rehabilitation Services Pager: [phone] Office: [place] [facility] Khadim Lundberg 10/21/2020, 10:05 AM

## 2020-10-21 NOTE — Progress Notes (Addendum)
Patient ID: Marcus Johnson., male   DOB: 01-17-1961, 60 y.o.   MRN: [id number]      Advanced Heart Failure Rounding Note  PCP-Cardiologist: No primary care provider on file.   Subjective:    - 2/27 Milrinone switched to dobutamine 5 mcg.  - 2/28 Moved to ICU. Dobutamine increased to 7.5 mcg and Norepi 3 mcg added. Diuretics held.  - 3/1 Impella 5.5 placed.  - 3/4 Teeth removed - 3/10 HM3 LVAD placed -3/13 VAD speed increased to 5500. [name] out  - 3/14 Nephrology consulted. Given 1UPRBCs  -3/15 Started CRRT. Given 1UPRBCs.   On milrinone 0.125+ epi 4+ amio 30.  CO-OX 55%    Hgb 8.1>>pending   WBC 28-> 29>27.2->17.9 ->17.6K->pending   LDH 293>444>597>593>670>575 >571 >485 >467   Remains on CRRT currently pulling 200/hr---8L removed yesterday. Weight down another 16 pounds. CVP 14. Remains anuric.   No complaints today. Had BMP yesterday.   Echo: EF 20-25%, normal RV, PASP 61, dilated IVC.   LVAD Interrogation HM 3: Speed: 3785 Flow:5.4   PI:1.9   Power: 4.1  VAD interrogated personally.  1 PI event.   Objective:   Weight Range: 89.2 kg Body mass index is 27.82 kg/m.   Vital Signs:   Temp:  [97.5 F (36.4 C)-98.2 F (36.8 C)] 98.2 F (36.8 C) (03/18 0405) Pulse Rate:  [46-99] 89 (03/18 0600) Resp:  [9-23] 13 (03/18 0600) BP: (80-132)/(66-103) 91/70 (03/18 0202) SpO2:  [86 %-100 %] 97 % (03/18 0600) Weight:  [89.2 kg] 89.2 kg (03/18 0400) Last BM Date: 10/20/20  Weight change: Filed Weights   10/19/20 0500 10/20/20 0500 10/21/20 0400  Weight: 98.7 kg 96.2 kg 89.2 kg    Intake/Output:   Intake/Output Summary (Last 24 hours) at 10/21/2020 0714 Last data filed at 10/21/2020 0700 Gross per 24 hour  Intake 3243.96 ml  Output 8050 ml  Net -4806.04 ml      Physical Exam   Physical Exam: CVP 14 GENERAL: Mildly diaphoretic. No acute distress. HEENT: normal  NECK: Supple, JVP to jaw .  + Rt IJ HD cath 2+ bilaterally, no bruits.  No lymphadenopathy or  thyromegaly appreciated.   CARDIAC:  Mechanical heart sounds with LVAD hum present.  LUNGS:  Clear to auscultation bilaterally. No wheezing   ABDOMEN:  Soft, round, nontender, positive bowel sounds x4.     LVAD exit site:  Dressing dry and intact.  No erythema or drainage.  Stabilization device present and accurately applied.   EXTREMITIES:  Warm and dry, no cyanosis, clubbing, rash or edema . RUE PICC + bilateral SCDs NEUROLOGIC:  Alert and oriented x 3.  No aphasia.  No dysarthria.  Affect pleasant.      Telemetry   NSR 90s   Labs    CBC Recent Labs    10/19/20 0304 10/19/20 0324 10/20/20 0255 10/20/20 0310  WBC 17.7*  --   --  13.8*  NEUTROABS 14.8*  --   --  11.3*  HGB 8.5*   < > 8.8* 8.1*  HCT 26.8*   < > 26.0* 25.3*  MCV 88.7  --   --  89.4  PLT 262  --   --  255   < > = values in this interval not displayed.   Basic Metabolic Panel Recent Labs    10/20/20 0310 10/20/20 0311 10/20/20 1742 10/21/20 0416  NA  --    < > 130* 133*  K  --    < >  4.7 4.6  CL  --    < > 95* 98  CO2  --    < > 24 25  GLUCOSE  --    < > 338* 235*  BUN  --    < > 27* 26*  CREATININE  --    < > 1.83* 1.70*  CALCIUM  --    < > 7.8* 8.3*  MG 2.3  --   --  2.6*  PHOS  --    < > 3.3 1.7*   < > = values in this interval not displayed.   Liver Function Tests Recent Labs    10/20/20 1742 10/21/20 0416  ALBUMIN 2.4* 2.5*   No results for input(s): LIPASE, AMYLASE in the last 72 hours. Cardiac Enzymes No results for input(s): CKTOTAL, CKMB, CKMBINDEX, TROPONINI in the last 72 hours.  BNP: BNP (last 3 results) Recent Labs    10/14/20 0254 10/20/20 0305  BNP 1,097.8* 466.7*    ProBNP (last 3 results) No results for input(s): PROBNP in the last 8760 hours.   D-Dimer No results for input(s): DDIMER in the last 72 hours. Hemoglobin A1C No results for input(s): HGBA1C in the last 72 hours. Fasting Lipid Panel No results for input(s): CHOL, HDL, LDLCALC, TRIG, CHOLHDL,  LDLDIRECT in the last 72 hours. Thyroid Function Tests No results for input(s): TSH, T4TOTAL, T3FREE, THYROIDAB in the last 72 hours.  Invalid input(s): FREET3  Other results:   Imaging    No results found.   Medications:     Scheduled Medications: . sodium chloride   Intravenous Once  . aspirin EC  325 mg Oral Daily   Or  . aspirin  324 mg Per Tube Daily   Or  . aspirin  300 mg Rectal Daily  . B-complex with vitamin C  1 tablet Oral Daily  . Chlorhexidine Gluconate Cloth  6 each Topical Daily  . feeding supplement  237 mL Oral BID BM  . feeding supplement (PROSource TF)  45 mL Per Tube TID  . feeding supplement (VITAL 1.5 CAL)  1,000 mL Per Tube Q24H  . gabapentin  100 mg Oral QHS  . insulin aspart  0-15 Units Subcutaneous Q4H  . insulin detemir  15 Units Subcutaneous BID  . lidocaine  1 patch Transdermal Q24H  . mouth rinse  15 mL Mouth Rinse BID  . metoCLOPramide (REGLAN) injection  5 mg Intravenous Q12H  . midodrine  10 mg Oral TID WC  . pantoprazole  40 mg Oral Daily  . polyethylene glycol  17 g Oral Daily  . senna-docusate  2 tablet Oral BID  . sodium chloride flush  10-40 mL Intracatheter Q12H  . sodium chloride flush  3 mL Intravenous Q12H  . warfarin  5 mg Oral q1600  . Warfarin - Physician Dosing Inpatient   Does not apply q1600    Infusions: .  prismasol BGK 4/2.5 400 mL/hr at 10/21/20 0117  . sodium chloride Stopped (10/16/20 1426)  . sodium chloride    . sodium chloride 20 mL/hr at 10/17/20 1715  . amiodarone 30 mg/hr (10/21/20 0700)  . epinephrine 4 mcg/min (10/21/20 0700)  . lactated ringers    . lactated ringers Stopped (10/17/20 1655)  . milrinone 0.125 mcg/kg/min (10/21/20 0700)  . prismasol BGK 2/2.5 replacement solution 200 mL/hr at 10/20/20 1926  . prismasol BGK 4/2.5 1,500 mL/hr at 10/21/20 0523    PRN Medications: sodium chloride, dextrose, heparin, heparin, morphine injection, ondansetron (ZOFRAN) IV, oxyCODONE, sodium  chloride  flush, sodium chloride flush, traMADol, white petrolatum    Assessment/Plan   1.Acute on chronic systolic HF -> cardiogenic shock:ICM.HasMedtronic ICD.EF 15%. Low output persisted despite milrinone and then dobutamine with rise in creatinine to 2.5. Impella 5.5 placed 3/1.  Creatinine improved to 1.7 and HM3 LVAD was placed on 3/10.  -Patient currently on milrinone 0.125 +epi 4. CO-OX 55%. CVP 15 -  Continue midodrine 10 mg tid.  - Volume managed per CRRT   2. LVAD - 3/13 VAD speed increased to 5500. - ASA 325 until INR up to 2 then cut aspirin back to 81 mg daily.  INR 1.6 today.  - LDH- 293>444>597>593>670>575>571 >562>563 - Dosing  anticoagulation per CT surgery.   - DL site ok 3. CAD s/p CABG2002:Last cath in 6/19 with patent grafts.  - Continue Crestor 40 mg daily.  4. AKI on CKD Stage 3b: Suspect that this is a combination of cardiorenal syndrome and diabetic nephropathy and possibly ATN.  - creatinine trended back up post-op. Started on CRRT 3/15 - Nephrology appreciated. Discussed w/ Dr. Nephrology at bedside. Continue on CRRT today  5. Atrial flutter/fibrillation: s/p DC-CV 08/21/18.He has been in rate-controlled atrial fibrillation.  -Continue amio drip 30 mg per hour.  -On coumadin. INR 1.6. Per CT surgery  6. Wound R Foot: Partial thickness skin loss noted. Cover with mepilex border and change every 5 days.  7. Right arm pain: Think neuropathic, per Dr. Cyndia Bent patient had stretch of brachial plexus with Impella placement.  8. ID: WBC pending. Afebrile, negative cultures. Follow closely  9. SL:HTDSKAJ of very poor control but hgbA1ctrending down. Most recent 7.7%  - Cover with SSI. 10. Anemia  - hgb 8.1 yesterday. Repeat CBC pending  11. Deconditioning - PT/OT following  - will need CIR once medically stable   Length of Stay: [address] PA-C  10/21/2020, 7:14 AM  Advanced Heart Failure Team Pager [phone] (M-F; [place])  Please contact  [facility] for night-coverage after hours (4p -7a ) and weekends on [email or web address]  Agree with above. Remains on CVVHD on dual pressors. CVP 14. Remains anuric. Co-ox marginal. + BM  Feels weak. R arm numb/sore.  INR 1.6  General:  Sitting up in chair weak HEENT: normal  Neck: supple. JVP elevated Carotids 2+ bilat; no bruits. No lymphadenopathy or thryomegaly appreciated. Cor: LVAD hum.  Lungs: Clear. Abdomen: obese soft, nontender, + distended. No hepatosplenomegaly. No bruits or masses. Good bowel sounds. Driveline site clean. Anchor in place.  Extremities: no cyanosis, clubbing, rash. 1+ edema  Neuro: alert & oriented x 3. No focal deficits. Moves all 4 without problem   Remains very tenuous. Continue current inotropes. Can increase epi as needed. Keep co-ox > 55%. Will add low-dose heparin for CRRT circuit. D/w PharmD and TCTS. VAD interrogated personally. Parameters stable.  CRITICAL CARE Performed by: Glori Bickers  Total critical care time: 35 minutes  Critical care time was exclusive of separately billable procedures and treating other patients.  Critical care was necessary to treat or prevent imminent or life-threatening deterioration.  Critical care was time spent personally by me (independent of midlevel providers or residents) on the following activities: development of treatment plan with patient and/or surrogate as well as nursing, discussions with consultants, evaluation of patient's response to treatment, examination of patient, obtaining history from patient or surrogate, ordering and performing treatments and interventions, ordering and review of laboratory studies, ordering and review of radiographic studies, pulse oximetry and re-evaluation  of patient's condition.   Glori Bickers, MD  11:56 AM

## 2020-10-21 NOTE — Progress Notes (Signed)
ANTICOAGULATION CONSULT NOTE - Initial Consult  Pharmacy Consult for Warfarin Indication: LVAD  Allergies  Allergen Reactions  . Meclizine Hcl Anaphylaxis and Swelling  . Ivabradine Nausea Only    Patient Measurements: Height: 5' 10.5" (179.1 cm) Weight: 89.2 kg (196 lb 10.4 oz) IBW/kg (Calculated) : 74.15 Heparin Dosing Weight: 89.2 kg  Vital Signs: Temp: 98.2 F (36.8 C) (03/18 0748) Temp Source: Axillary (03/18 0748) BP: 91/70 (03/18 0202) Pulse Rate: 89 (03/18 0600)  Labs: Recent Labs    10/18/20 1538 10/19/20 0304 10/19/20 0324 10/19/20 1600 10/20/20 0255 10/20/20 0310 10/20/20 0311 10/20/20 1742 10/21/20 0416  HGB 8.1* 8.5* 9.2*  --  8.8* 8.1*  --   --   --   HCT 26.0* 26.8* 27.0*  --  26.0* 25.3*  --   --   --   PLT 258 262  --   --   --  255  --   --   --   LABPROT  --  19.0*  --   --   --  19.4*  --   --  18.5*  INR  --  1.7*  --   --   --  1.7*  --   --  1.6*  CREATININE 3.27* 2.73*  --    < >  --   --  2.04* 1.83* 1.70*   < > = values in this interval not displayed.    Estimated Creatinine Clearance: 53.1 mL/min (A) (by C-G formula based on SCr of 1.7 mg/dL (H)).   Medical History: Past Medical History:  Diagnosis Date  . AICD (automatic cardioverter/defibrillator) present    Single-chamber  implantable cardiac defibrillator - Medtronic  . Atrial fibrillation ([place])   . Cataract    Mixed form OU  . CHF (congestive heart failure) ([place])   . Chronic kidney disease   . Chronic kidney disease (CKD), stage III (moderate) (HCC)   . Chronic systolic heart failure ([place])    a. Echo 5/13: Mild LVE, mild LVH, EF 10%, anteroseptal, lateral, apical AK, mild MR, mild LAE, moderately reduced RVSF, mild RAE, PASP 60;  b. 07/2014 Echo: EF 20-2%, diff HK, AKI of antsep/apical/mid-apicalinferior, mod reduced RV.  Marland Kitchen Coronary artery disease    a. s/p CABG 2002 b. LHC 5/13:  dLM 80%, LAD subtotally occluded, pCFX occluded, pRCA 50%, mid? Occlusion with high take off  of the PDA with 70% multiple lesions-not bypassed and supplies collaterals to LAD, LIMA-IM/ramus ok, S-OM ok, S-PLA branches ok. Medical therapy was recommended  . COVID   . Diabetic retinopathy (HCC)    NPDR OD, PDR OS  . Dyspnea   . Gout    "on daily RX" (01/08/2018)  . Hypertension   . Hypertensive retinopathy    OU  . Ischemic cardiomyopathy    a. Echo 5/13: Mild LVE, mild LVH, EF 10%, anteroseptal, lateral, apical AK, mild MR, mild LAE, moderately reduced RVSF, mild RAE, PASP 60;  b. 01/2012 s/p MDT [id number] Protecta XT VR AICD;  c. 07/2014 Echo: EF 20-2%, diff HK, AKI of antsep/apical/mid-apicalinferior, mod reduced RV.  Marland Kitchen MRSA (methicillin resistant Staphylococcus aureus)    Status post right foot plantar deep infection with MRSA status post  I&D 10/2008  . Myocardial infarction [facility])    "was told I'd had several before heart OR in 2002" (01/08/2018)  . Noncompliance   . Peripheral neuropathy   . Retinopathy, diabetic, background ([place])   . Syncope   . Type II diabetes mellitus ([place])  requiring insulin   . Vitreous hemorrhage, left (HCC)    and proliferative diabetic retinopathy    Assessment: 60 yo M s/p new LVAD implant with HeartMate III on 3/10. Warfarin started per MD on 3/12, and pharmacy asked to take over warfarin dosing.   INR subtherapeutic and down from 1.7 to 1.6 today. Patient has been on tube feeds, but stopping tube feeds today and increasing Ensures to TID.   Heparin also added through CRRT circuit to help with circuit clotting off. Will continue at 400 units/hr through CRRT per renal. CBC stable. No overt bleeding.   Goal of Therapy:  INR 2-2.5 Monitor platelets by anticoagulation protocol: Yes   Plan:  Warfarin 5 mg PO tonight Daily PT/INR, CBC, monitor s/sx bleeding Monitor aPTT/HL while receiving heparin through CRRT to ensure not supratherapeutic  [name], PharmD, BCPS PGY2 Cardiology Pharmacy Resident Phone: [phone] 10/21/2020  11:37  AM  Please check [email or web address] for unit-specific pharmacy phone numbers.

## 2020-10-21 NOTE — Progress Notes (Signed)
Occupational Therapy Treatment Patient Details Name: Marcus Johnson. MRN: [id number] DOB: 10-Jan-1961 Today's Date: 10/21/2020    History of present illness 60 y.o. male admitted 2/28 with ICM for LVAD workup. Impella placed 3/1. Heartmate 3 LVAD placed 3/10. CRRT started 3/15. PMHx: DM2, CAD s/p CABG 8657, systolic HF due to ischemic cardiomyopathy with EF 20-25% (echo 12/15), DM2 and CKD. He is s/p Medtronic single chamber ICD. Covid 1/22   OT comments  This 60 yo male admitted with above seen today for RUE/LUE strengthening. He continues to have strength/coordination issues in RUE with questionable brachial plexus stretch where impella was--have asked for a fellow OT to look at this to see if there is anything that we can do to help him to use his RUE hand functionally to be able to do his battery<>wall changes for LVAD. We will continue to follow.  Follow Up Recommendations  CIR;Supervision/Assistance - 24 hour    Equipment Recommendations  Other (comment) (TBD next venue)       Precautions / Restrictions Precautions Precautions: Fall;Sternal Precaution Comments: LVAD, CRRT, multiple lines                     Vision Baseline Vision/History: Wears glasses Wears Glasses: Reading only Patient Visual Report: No change from baseline            Cognition Arousal/Alertness: Awake/alert Behavior During Therapy: Flat affect Overall Cognitive Status: Impaired/Different from baseline                     Following Commands: Follows one step commands consistently     Problem Solving: Slow processing General Comments: He was able to recall my name when I entered room just from me talking to him        Exercises  Other Exercises: Pt seen for Bil UE exercises. Pt completed 15 reps of hand exerciser with yellow band (3 sets of 5); he can abduct his fingers but cannot adduct them, he can fully extend fingers for LUE. For RUE I assisted him with squeeze ball  exercises (3 x 5 reps), thumb to finger tips (he can do first, struggles with 2nd, cannot do 3-4), he can abduct his fingers but cannot adduct, when he attempts to squeeze ball he goes into more of a tenodesis grasp, he can fully extend fingers. Other Exercises: I spoke with him about if he had used the cup with handle and red tubing for utensils to eat with his RUE. He said he had not, that he had tried but it had not worked. He had done this for me yesterday so not sure why did not work with him later yesterday or this morning. I spoke to his RN today Lavella Lemons) and made her aware of his AE and that the patient may need to have his food plate placed in his lap to access his food easier with RUE due to decreased strength overall in RUE (RN yesterday made aware as well).           Pertinent Vitals/ Pain       Pain Assessment: No/denies pain Pain Location: only reports pain/tightness if RUE fingers when assisted to flex them more.         Frequency  Min 2X/week        Progress Toward Goals  OT Goals(current goals can now be found in the care plan section)  Progress towards OT goals: Not progressing toward goals - comment (reports had issues  with use AE since issued yesterday, continues to have strength and coordination issues in RUE (he tries everything asked of him))  Acute Rehab OT Goals Patient Stated Goal: to return to independent mobility OT Goal Formulation: With patient Time For Goal Achievement: 10/31/20 Potential to Achieve Goals: Good  Plan Discharge plan remains appropriate       AM-PAC OT "6 Clicks" Daily Activity     Outcome Measure   Help from another person eating meals?: A Little Help from another person taking care of personal grooming?: A Lot Help from another person toileting, which includes using toliet, bedpan, or urinal?: Total Help from another person bathing (including washing, rinsing, drying)?: Total Help from another person to put on and taking off  regular upper body clothing?: Total Help from another person to put on and taking off regular lower body clothing?: Total 6 Click Score: 9    End of Session    OT Visit Diagnosis: Unsteadiness on feet (R26.81);Other abnormalities of gait and mobility (R26.89);Muscle weakness (generalized) (M62.81);Other symptoms and signs involving cognitive function;Dizziness and giddiness (R42)   Activity Tolerance Patient tolerated treatment well   Patient Left in chair;with call bell/phone within reach   Nurse Communication          Time: 1000-1018 OT Time Calculation (min): 18 min  Charges: OT General Charges $OT Visit: 1 Visit OT Treatments $Therapeutic Exercise: 8-22 mins  [address], OTR/L Acute [employer] Pager [phone] Office [phone]      Almon Register 10/21/2020, 11:23 AM

## 2020-10-22 DIAGNOSIS — Z95811 Presence of heart assist device: Secondary | ICD-10-CM | POA: Diagnosis not present

## 2020-10-22 DIAGNOSIS — I5023 Acute on chronic systolic (congestive) heart failure: Secondary | ICD-10-CM | POA: Diagnosis not present

## 2020-10-22 DIAGNOSIS — N179 Acute kidney failure, unspecified: Secondary | ICD-10-CM | POA: Diagnosis not present

## 2020-10-22 LAB — RENAL FUNCTION PANEL
Albumin: 2.5 g/dL — ABNORMAL LOW (ref 3.5–5.0)
Albumin: 2.3 g/dL — ABNORMAL LOW (ref 3.5–5.0)
Anion gap: 10 (ref 5–15)
Anion gap: 11 (ref 5–15)
BUN: 26 mg/dL — ABNORMAL HIGH (ref 6–20)
BUN: 24 mg/dL — ABNORMAL HIGH (ref 6–20)
CO2: 25 mmol/L (ref 22–32)
CO2: 25 mmol/L (ref 22–32)
Calcium: 8.3 mg/dL — ABNORMAL LOW (ref 8.9–10.3)
Calcium: 7.8 mg/dL — ABNORMAL LOW (ref 8.9–10.3)
Chloride: 97 mmol/L — ABNORMAL LOW (ref 98–111)
Chloride: 94 mmol/L — ABNORMAL LOW (ref 98–111)
Creatinine, Ser: 1.69 mg/dL — ABNORMAL HIGH (ref 0.61–1.24)
Creatinine, Ser: 1.75 mg/dL — ABNORMAL HIGH (ref 0.61–1.24)
GFR, Estimated: 46 mL/min — ABNORMAL LOW (ref >60)
GFR, Estimated: 44 mL/min — ABNORMAL LOW (ref >60)
Glucose, Bld: 82 mg/dL (ref 70–99)
Glucose, Bld: 358 mg/dL — ABNORMAL HIGH (ref 70–99)
Phosphorus: 1.9 mg/dL — ABNORMAL LOW (ref 2.5–4.6)
Phosphorus: 3.7 mg/dL (ref 2.5–4.6)
Potassium: 4.5 mmol/L (ref 3.5–5.1)
Potassium: 5.5 mmol/L — ABNORMAL HIGH (ref 3.5–5.1)
Sodium: 132 mmol/L — ABNORMAL LOW (ref 135–145)
Sodium: 130 mmol/L — ABNORMAL LOW (ref 135–145)

## 2020-10-22 LAB — PROTIME-INR
INR: 1.6 — ABNORMAL HIGH (ref 0.8–1.2)
Prothrombin Time: 18.6 seconds — ABNORMAL HIGH (ref 11.4–15.2)

## 2020-10-22 LAB — CBC
HCT: 27.2 % — ABNORMAL LOW (ref 39.0–52.0)
Hemoglobin: 8.3 g/dL — ABNORMAL LOW (ref 13.0–17.0)
MCH: 27.6 pg (ref 26.0–34.0)
MCHC: 30.5 g/dL (ref 30.0–36.0)
MCV: 90.4 fL (ref 80.0–100.0)
Platelets: 327 10*3/uL (ref 150–400)
RBC: 3.01 MIL/uL — ABNORMAL LOW (ref 4.22–5.81)
RDW: 18.6 % — ABNORMAL HIGH (ref 11.5–15.5)
WBC: 14.8 10*3/uL — ABNORMAL HIGH (ref 4.0–10.5)
nRBC: 0.9 % — ABNORMAL HIGH (ref 0.0–0.2)

## 2020-10-22 LAB — COOXEMETRY PANEL
Carboxyhemoglobin: 1.7 % — ABNORMAL HIGH (ref 0.5–1.5)
Carboxyhemoglobin: 1.5 % (ref 0.5–1.5)
Carboxyhemoglobin: 1.8 % — ABNORMAL HIGH (ref 0.5–1.5)
Methemoglobin: 0.9 % (ref 0.0–1.5)
Methemoglobin: 0.7 % (ref 0.0–1.5)
Methemoglobin: 0.8 % (ref 0.0–1.5)
O2 Saturation: 46.1 %
O2 Saturation: 47.4 %
O2 Saturation: 49.9 %
Total hemoglobin: 7.8 g/dL — ABNORMAL LOW (ref 12.0–16.0)
Total hemoglobin: 14.1 g/dL (ref 12.0–16.0)
Total hemoglobin: 8.3 g/dL — ABNORMAL LOW (ref 12.0–16.0)

## 2020-10-22 LAB — POCT I-STAT 7, (LYTES, BLD GAS, ICA,H+H)
Acid-Base Excess: 3 mmol/L — ABNORMAL HIGH (ref 0.0–2.0)
Bicarbonate: 25.1 mmol/L (ref 20.0–28.0)
Calcium, Ion: 1.1 mmol/L — ABNORMAL LOW (ref 1.15–1.40)
HCT: 28 % — ABNORMAL LOW (ref 39.0–52.0)
Hemoglobin: 9.5 g/dL — ABNORMAL LOW (ref 13.0–17.0)
O2 Saturation: 99 %
Patient temperature: 98.3
Potassium: 4.6 mmol/L (ref 3.5–5.1)
Sodium: 134 mmol/L — ABNORMAL LOW (ref 135–145)
TCO2: 26 mmol/L (ref 22–32)
pCO2 arterial: 27.7 mmHg — ABNORMAL LOW (ref 32.0–48.0)
pH, Arterial: 7.565 — ABNORMAL HIGH (ref 7.350–7.450)
pO2, Arterial: 99 mmHg (ref 83.0–108.0)

## 2020-10-22 LAB — GLUCOSE, CAPILLARY
Glucose-Capillary: 121 mg/dL — ABNORMAL HIGH (ref 70–99)
Glucose-Capillary: 146 mg/dL — ABNORMAL HIGH (ref 70–99)
Glucose-Capillary: 204 mg/dL — ABNORMAL HIGH (ref 70–99)
Glucose-Capillary: 213 mg/dL — ABNORMAL HIGH (ref 70–99)
Glucose-Capillary: 231 mg/dL — ABNORMAL HIGH (ref 70–99)
Glucose-Capillary: 259 mg/dL — ABNORMAL HIGH (ref 70–99)
Glucose-Capillary: 300 mg/dL — ABNORMAL HIGH (ref 70–99)
Glucose-Capillary: 49 mg/dL — ABNORMAL LOW (ref 70–99)
Glucose-Capillary: 63 mg/dL — ABNORMAL LOW (ref 70–99)

## 2020-10-22 LAB — APTT: aPTT: 44 seconds — ABNORMAL HIGH (ref 24–36)

## 2020-10-22 LAB — LACTATE DEHYDROGENASE: LDH: 541 U/L — ABNORMAL HIGH (ref 98–192)

## 2020-10-22 LAB — HEPARIN LEVEL (UNFRACTIONATED): Heparin Unfractionated: <0.1 IU/mL — ABNORMAL LOW (ref 0.30–0.70)

## 2020-10-22 LAB — MAGNESIUM: Magnesium: 2.6 mg/dL — ABNORMAL HIGH (ref 1.7–2.4)

## 2020-10-22 MED ORDER — INSULIN ASPART 100 UNIT/ML ~~LOC~~ SOLN
0.0000 [IU] | Freq: Three times a day (TID) | SUBCUTANEOUS | Status: DC
  Administered 2020-10-22: 5 [IU] via SUBCUTANEOUS
  Administered 2020-10-22: 8 [IU] via SUBCUTANEOUS
  Administered 2020-10-23 (×2): 3 [IU] via SUBCUTANEOUS
  Administered 2020-10-23: 8 [IU] via SUBCUTANEOUS
  Administered 2020-10-24: 5 [IU] via SUBCUTANEOUS
  Administered 2020-10-24: 2 [IU] via SUBCUTANEOUS
  Administered 2020-10-24: 8 [IU] via SUBCUTANEOUS
  Administered 2020-10-25: 11 [IU] via SUBCUTANEOUS
  Administered 2020-10-25: 5 [IU] via SUBCUTANEOUS

## 2020-10-22 MED ORDER — INSULIN DETEMIR 100 UNIT/ML ~~LOC~~ SOLN
15.0000 [IU] | Freq: Two times a day (BID) | SUBCUTANEOUS | Status: DC
  Administered 2020-10-22 – 2020-10-25 (×6): 15 [IU] via SUBCUTANEOUS
  Filled 2020-10-22 (×7): qty 0.15

## 2020-10-22 MED ORDER — DEXTROSE 50 % IV SOLN
12.5000 g | INTRAVENOUS | Status: AC
  Administered 2020-10-22: 12.5 g via INTRAVENOUS

## 2020-10-22 MED ORDER — POTASSIUM PHOSPHATES 15 MMOLE/5ML IV SOLN
20.0000 mmol | Freq: Once | INTRAVENOUS | Status: AC
  Administered 2020-10-22: 20 mmol via INTRAVENOUS
  Filled 2020-10-22: qty 6.67

## 2020-10-22 MED ORDER — WARFARIN SODIUM 5 MG PO TABS
5.0000 mg | ORAL_TABLET | Freq: Once | ORAL | Status: AC
  Administered 2020-10-22: 5 mg via ORAL
  Filled 2020-10-22: qty 1

## 2020-10-22 MED ORDER — INSULIN DETEMIR 100 UNIT/ML ~~LOC~~ SOLN
10.0000 [IU] | Freq: Two times a day (BID) | SUBCUTANEOUS | Status: DC
  Administered 2020-10-22: 10 [IU] via SUBCUTANEOUS
  Filled 2020-10-22 (×2): qty 0.1

## 2020-10-22 MED ORDER — INSULIN ASPART 100 UNIT/ML ~~LOC~~ SOLN
0.0000 [IU] | Freq: Every day | SUBCUTANEOUS | Status: DC
  Administered 2020-10-22: 3 [IU] via SUBCUTANEOUS
  Administered 2020-10-24: 2 [IU] via SUBCUTANEOUS

## 2020-10-22 NOTE — Plan of Care (Signed)
  Problem: Education: Goal: Knowledge of General Education information will improve Description: Including pain rating scale, medication(s)/side effects and non-pharmacologic comfort measures Outcome: Progressing   Problem: Health Behavior/Discharge Planning: Goal: Ability to manage health-related needs will improve Outcome: Progressing   Problem: Clinical Measurements: Goal: Ability to maintain clinical measurements within normal limits will improve Outcome: Progressing Note: Required some uptitration in Epi gtt today for decreased Coox and PI events early this am, improvement noted with intervention Goal: Will remain free from infection Outcome: Progressing Goal: Diagnostic test results will improve Outcome: Progressing Goal: Cardiovascular complication will be avoided Outcome: Progressing   Problem: Activity: Goal: Risk for activity intolerance will decrease Outcome: Not Progressing Note: Pt has not progressed past dangling on side of bed in most recent shifts and that requires significant support and instruction, PT is working with patient, will continue to progress as able    Problem: Nutrition: Goal: Adequate nutrition will be maintained Outcome: Progressing   Problem: Coping: Goal: Level of anxiety will decrease Outcome: Progressing   Problem: Elimination: Goal: Will not experience complications related to bowel motility Outcome: Progressing Note: Pt is on bowel regimen Goal: Will not experience complications related to urinary retention Outcome: Not Applicable   Problem: Pain Managment: Goal: General experience of comfort will improve Outcome: Progressing   Problem: Safety: Goal: Ability to remain free from injury will improve Outcome: Progressing   Problem: Skin Integrity: Goal: Risk for impaired skin integrity will decrease Outcome: Progressing   Problem: Education: Goal: Ability to demonstrate management of disease process will improve Outcome:  Progressing Goal: Ability to verbalize understanding of medication therapies will improve Outcome: Progressing Goal: Individualized Educational Video(s) Outcome: Progressing   Problem: Activity: Goal: Capacity to carry out activities will improve Outcome: Progressing   Problem: Cardiac: Goal: Ability to achieve and maintain adequate cardiopulmonary perfusion will improve Outcome: Progressing   Problem: Education: Goal: Knowledge of disease or condition will improve Outcome: Progressing Goal: Understanding of medication regimen will improve Outcome: Progressing Goal: Individualized Educational Video(s) Outcome: Progressing   Problem: Activity: Goal: Ability to tolerate increased activity will improve Outcome: Progressing   Problem: Cardiac: Goal: Ability to achieve and maintain adequate cardiopulmonary perfusion will improve Outcome: Progressing   Problem: Health Behavior/Discharge Planning: Goal: Ability to safely manage health-related needs after discharge will improve Outcome: Progressing   Problem: Education: Goal: Understanding of CV disease, CV risk reduction, and recovery process will improve Outcome: Progressing Goal: Individualized Educational Video(s) Outcome: Progressing   Problem: Activity: Goal: Ability to return to baseline activity level will improve Outcome: Progressing   Problem: Cardiovascular: Goal: Ability to achieve and maintain adequate cardiovascular perfusion will improve Outcome: Progressing Goal: Vascular access site(s) Level 0-1 will be maintained Outcome: Progressing   Problem: Health Behavior/Discharge Planning: Goal: Ability to safely manage health-related needs after discharge will improve Outcome: Progressing

## 2020-10-22 NOTE — Progress Notes (Signed)
[facility] NEPHROLOGY PROGRESS NOTE  Assessment/ Plan:  # Acute kidney injury on chronic kidney disease stage IIIa: With underlying chronic kidney disease likely from diabetes/hypertension and worsening renal function from hemodynamically mediated ATN versus cardiorenal syndrome.   Tolerating CRRT well, able to UF around 100-200 cc/per hour.  CVP around 20 therefore plan to continue CRRT for extracorporeal volume unloading/clearance with reassessment of volume status/labs daily to direct further management including ability to transition to IHD.  Discussed with RN, no problem with filter.  # Acute exacerbation of chronic systolic heart failure:With evidence of cardiogenic shock status post LVAD. Remains on pressors with epinephrine, milrinone.  Volume status/UF managed with CRRT.  # Anemia:Likely secondary to chronic disease and exacerbated by recent surgical losses.  Transfuse PRBC as needed.  # Atrial fibrillation: Intermittently tachycardic, continue amiodarone and on Coumadin   #Hypophosphatemia: Replete IV phosphate, requiring daily.  Monitor lab.  Subjective: Seen and examined in ICU.  Patient is alert awake.  Tolerating CRRT with ultrafiltration.  Currently on epinephrine, milrinone and amiodarone.  No new event.  Objective Vital signs in last 24 hours: Vitals:   10/22/20 0500 10/22/20 0700 10/22/20 0800 10/22/20 0900  BP:      Pulse: 76 (!) 30 80 85  Resp: 19 10 17 10   Temp:  (!) 96.4 F (35.8 C)    TempSrc:  Axillary    SpO2: 100% 100% 99% 99%  Weight: 87.3 kg     Height:       Weight change: -1.9 kg  Intake/Output Summary (Last 24 hours) at 10/22/2020 0920 Last data filed at 10/22/2020 0900 Gross per 24 hour  Intake 2893.35 ml  Output 5051 ml  Net -2157.65 ml       Labs: Basic Metabolic Panel: Recent Labs  Lab 10/21/20 0416 10/21/20 1631 10/22/20 0310 10/22/20 0439  NA 133* 132* 132* 134*  K 4.6 5.3* 4.5 4.6  CL 98 99 97*  --   CO2 25  24 25   --   GLUCOSE 235* 240* 82  --   BUN 26* 27* 26*  --   CREATININE 1.70* 1.87* 1.69*  --   CALCIUM 8.3* 7.4* 8.3*  --   PHOS 1.7* 4.9* 1.9*  --    Liver Function Tests: Recent Labs  Lab 10/16/20 0435 10/18/20 1538 10/21/20 0416 10/21/20 1631 10/22/20 0310  AST 145*  --   --   --   --   ALT 51*  --   --   --   --   ALKPHOS 82  --   --   --   --   BILITOT 3.1*  --   --   --   --   PROT 6.2*  --   --   --   --   ALBUMIN 2.4*   < > 2.5* 2.3* 2.5*   < > = values in this interval not displayed.   No results for input(s): LIPASE, AMYLASE in the last 168 hours. No results for input(s): AMMONIA in the last 168 hours. CBC: Recent Labs  Lab 10/18/20 0409 10/18/20 1538 10/19/20 0304 10/19/20 0324 10/20/20 0310 10/21/20 0930 10/22/20 0310 10/22/20 0439  WBC 17.9* 17.6* 17.7*  --  13.8* 15.9* 14.8*  --   NEUTROABS 14.7*  --  14.8*  --  11.3*  --   --   --   HGB 7.0* 8.1* 8.5*   < > 8.1* 8.4* 8.3* 9.5*  HCT 22.3* 26.0* 26.8*   < >  25.3* 26.6* 27.2* 28.0*  MCV 91.0 90.0 88.7  --  89.4 89.6 90.4  --   PLT 246 258 262  --  255 323 327  --    < > = values in this interval not displayed.   Cardiac Enzymes: No results for input(s): CKTOTAL, CKMB, CKMBINDEX, TROPONINI in the last 168 hours. CBG: Recent Labs  Lab 10/22/20 0015 10/22/20 0437 10/22/20 0505 10/22/20 0535 10/22/20 0657  GLUCAP 204* 49* 63* 146* 121*    Iron Studies: No results for input(s): IRON, TIBC, TRANSFERRIN, FERRITIN in the last 72 hours. Studies/Results: No results found.  Medications: Infusions: .  prismasol BGK 4/2.5 400 mL/hr at 10/22/20 0404  . sodium chloride Stopped (10/16/20 1426)  . sodium chloride    . sodium chloride 20 mL/hr at 10/17/20 1715  . amiodarone 30 mg/hr (10/22/20 0900)  . epinephrine 4 mcg/min (10/22/20 0900)  . heparin 10,000 units/ 20 mL infusion syringe 400 Units/hr (10/21/20 1405)  . lactated ringers    . lactated ringers Stopped (10/17/20 1655)  . milrinone 0.125  mcg/kg/min (10/22/20 0900)  . prismasol BGK 2/2.5 replacement solution 200 mL/hr at 10/21/20 2259  . prismasol BGK 4/2.5 1,500 mL/hr at 10/22/20 0825    Scheduled Medications: . sodium chloride   Intravenous Once  . aspirin EC  325 mg Oral Daily   Or  . aspirin  324 mg Per Tube Daily   Or  . aspirin  300 mg Rectal Daily  . B-complex with vitamin C  1 tablet Oral Daily  . Chlorhexidine Gluconate Cloth  6 each Topical Daily  . feeding supplement  237 mL Oral TID BM  . gabapentin  100 mg Oral QHS  . insulin aspart  0-15 Units Subcutaneous Q4H  . insulin detemir  15 Units Subcutaneous BID  . lidocaine  1 patch Transdermal Q24H  . mouth rinse  15 mL Mouth Rinse BID  . metoCLOPramide (REGLAN) injection  5 mg Intravenous Q12H  . midodrine  10 mg Oral TID WC  . pantoprazole  40 mg Oral Daily  . polyethylene glycol  17 g Oral Daily  . senna-docusate  2 tablet Oral BID  . sodium chloride flush  10-40 mL Intracatheter Q12H  . sodium chloride flush  3 mL Intravenous Q12H  . Warfarin - Pharmacist Dosing Inpatient   Does not apply q1600    have reviewed scheduled and prn medications.  Physical Exam: General:NAD, comfortable, alert awake Heart: Mechanical heart sound Lungs: Coarse breath sound bilateral Abdomen:soft, Non-tender, non-distended Extremities:LE edema+ Dialysis Access: Temporary HD catheter.  Marcus Johnson Marcus Johnson 10/22/2020,9:20 AM  LOS: 24 days

## 2020-10-22 NOTE — Progress Notes (Signed)
Drive line dressing change:   LVAD driveline dressing was changed using proper sterile technique. Old dressing removed. Driveline site cleaned using chloroprep x2. New Silver strip applied using sterile technique. New gauze dressing applied using sterile technique. Driveline secured at driveline site with 1 suture. Scant yellow exudate and minimal serosanguinous drainage at the drive line exit site, no redness, no swelling, no tenderness, no odor from drainage. Drive line anchor intact and applied correctly.   NEXT DRESSING CHANGE DUE 10/23/2020.   Alma Friendly RN

## 2020-10-22 NOTE — Progress Notes (Signed)
ANTICOAGULATION CONSULT NOTE - Follow Up Consult  Pharmacy Consult for Warfarin Indication: LVAD  Allergies  Allergen Reactions  . Meclizine Hcl Anaphylaxis and Swelling  . Ivabradine Nausea Only    Patient Measurements: Height: 5' 10.5" (179.1 cm) Weight: 87.3 kg (192 lb 7.4 oz) IBW/kg (Calculated) : 74.15 Heparin Dosing Weight: 89.2 kg  Vital Signs: Temp: 96.4 F (35.8 C) (03/19 0700) Temp Source: Axillary (03/19 0700) Pulse Rate: 85 (03/19 0900)  Labs: Recent Labs    10/20/20 0310 10/20/20 0311 10/21/20 0416 10/21/20 0930 10/21/20 1631 10/21/20 1818 10/22/20 0310 10/22/20 0439  HGB 8.1*  --   --  8.4*  --   --  8.3* 9.5*  HCT 25.3*  --   --  26.6*  --   --  27.2* 28.0*  PLT 255  --   --  323  --   --  327  --   APTT  --   --   --   --   --   --  44*  --   LABPROT 19.4*  --  18.5*  --   --   --  18.6*  --   INR 1.7*  --  1.6*  --   --   --  1.6*  --   HEPARINUNFRC  --   --   --   --   --  <0.10* <0.10*  --   CREATININE  --    < > 1.70*  --  1.87*  --  1.69*  --    < > = values in this interval not displayed.    Estimated Creatinine Clearance: 49.4 mL/min (A) (by C-G formula based on SCr of 1.69 mg/dL (H)).   Medical History: Past Medical History:  Diagnosis Date  . AICD (automatic cardioverter/defibrillator) present    Single-chamber  implantable cardiac defibrillator - Medtronic  . Atrial fibrillation ([place])   . Cataract    Mixed form OU  . CHF (congestive heart failure) ([place])   . Chronic kidney disease   . Chronic kidney disease (CKD), stage III (moderate) (HCC)   . Chronic systolic heart failure ([place])    a. Echo 5/13: Mild LVE, mild LVH, EF 10%, anteroseptal, lateral, apical AK, mild MR, mild LAE, moderately reduced RVSF, mild RAE, PASP 60;  b. 07/2014 Echo: EF 20-2%, diff HK, AKI of antsep/apical/mid-apicalinferior, mod reduced RV.  Marland Kitchen Coronary artery disease    a. s/p CABG 2002 b. LHC 5/13:  dLM 80%, LAD subtotally occluded, pCFX occluded, pRCA 50%,  mid? Occlusion with high take off of the PDA with 70% multiple lesions-not bypassed and supplies collaterals to LAD, LIMA-IM/ramus ok, S-OM ok, S-PLA branches ok. Medical therapy was recommended  . COVID   . Diabetic retinopathy (HCC)    NPDR OD, PDR OS  . Dyspnea   . Gout    "on daily RX" (01/08/2018)  . Hypertension   . Hypertensive retinopathy    OU  . Ischemic cardiomyopathy    a. Echo 5/13: Mild LVE, mild LVH, EF 10%, anteroseptal, lateral, apical AK, mild MR, mild LAE, moderately reduced RVSF, mild RAE, PASP 60;  b. 01/2012 s/p MDT [id number] Protecta XT VR AICD;  c. 07/2014 Echo: EF 20-2%, diff HK, AKI of antsep/apical/mid-apicalinferior, mod reduced RV.  Marland Kitchen MRSA (methicillin resistant Staphylococcus aureus)    Status post right foot plantar deep infection with MRSA status post  I&D 10/2008  . Myocardial infarction [facility])    "was told I'd had several before heart OR  in 2002" (01/08/2018)  . Noncompliance   . Peripheral neuropathy   . Retinopathy, diabetic, background ([place])   . Syncope   . Type II diabetes mellitus ([place])    requiring insulin   . Vitreous hemorrhage, left (HCC)    and proliferative diabetic retinopathy    Assessment: 60 yo M s/p new LVAD implant with HeartMate III on 3/10. Warfarin started per MD on 3/12, and pharmacy asked to take over warfarin dosing.   INR remains subtherapeutic at 1.6 today. Also adjustibg TF and ensure so varying amounts vitamin K. Cautious warfarin dosing  Heparin  added through CRRT circuit to help with circuit clotting off. Will continue at 400 units/hr through CRRT per renal. Heparin level undetectable CBC stable. No overt bleeding.   Goal of Therapy:  INR 2-2.5 Monitor platelets by anticoagulation protocol: Yes   Plan:  Warfarin 5 mg PO tonight repeat Daily PT/INR, CBC, monitor s/sx bleeding Monitor aPTT/HL while receiving heparin through CRRT to ensure not supratherapeutic   Bonnita Nasuti Pharm.D. CPP, BCPS Clinical  Pharmacist [phone] 10/22/2020 10:31 AM    Please check [email or web address] for unit-specific pharmacy phone numbers.

## 2020-10-22 NOTE — Progress Notes (Signed)
RN received orders from Dr. Windy Kalata to increase epi to 5 and repeat coox in 2 hours.

## 2020-10-22 NOTE — Progress Notes (Addendum)
Patient ID: Marcus Orsino., male   DOB: Sep 14, 1960, 60 y.o.   MRN: [id number]      Advanced Heart Failure Rounding Note  PCP-Cardiologist: No primary care provider on file.   Subjective:    - 2/27 Milrinone switched to dobutamine 5 mcg.  - 2/28 Moved to ICU. Dobutamine increased to 7.5 mcg and Norepi 3 mcg added. Diuretics held.  - 3/1 Impella 5.5 placed.  - 3/4 Teeth removed - 3/10 HM3 LVAD placed -3/13 VAD speed increased to 5500. [name] out  - 3/14 Nephrology consulted. Given 1UPRBCs  - 3/15 Started CRRT. Given 1UPRBCs.   On milrinone 0.125+ epi 4+ amio 30.  CO-OX 55%-> 46%    Remains on CRRT currently pulling 100-200/hr. Weight down another 4 pounds. Weight 192 (pre-op 175-180). CVP remains at 18   On heparin in CRT circuit only  Continues to feel very weak. Unable to get out of bed this am. R arm sore and numb. TFs stopped yesterday. Sugar dropped  LDH 293>444>597>593>670>575 >571 >485 >467 > 541   Echo: EF 20-25%, normal RV, PASP 61, dilated IVC.   LVAD Interrogation HM 3: Speed: 5732 Flow:5.4   PI:1.8   Power: 4.0  VAD interrogated personally. Parameters stable.   Objective:   Weight Range: 87.3 kg Body mass index is 27.23 kg/m.   Vital Signs:   Temp:  [96.4 F (35.8 C)-98.7 F (37.1 C)] 96.4 F (35.8 C) (03/19 0700) Pulse Rate:  [30-91] 85 (03/19 0900) Resp:  [10-25] 10 (03/19 0900) BP: (82-130)/(30-54) 130/54 (03/18 1647) SpO2:  [99 %-100 %] 99 % (03/19 0900) Weight:  [87.3 kg] 87.3 kg (03/19 0500) Last BM Date: 10/21/20  Weight change: Filed Weights   10/20/20 0500 10/21/20 0400 10/22/20 0500  Weight: 96.2 kg 89.2 kg 87.3 kg    Intake/Output:   Intake/Output Summary (Last 24 hours) at 10/22/2020 0947 Last data filed at 10/22/2020 0900 Gross per 24 hour  Intake 2893.35 ml  Output 5051 ml  Net -2157.65 ml      Physical Exam   General:  Sitting up in bed Weak appearing.  HEENT: normal  Neck: supple. JVP 13.  Carotids 2+ bilat; no  bruits. No lymphadenopathy or thryomegaly appreciated. Cor: LVAD hum. Sternal wound ok  Lungs: Clear. Abdomen: obese soft, nontender, mildly distended. No hepatosplenomegaly. No bruits or masses. Good bowel sounds. Driveline site clean. Anchor in place. Extremities: no cyanosis, clubbing, rash. Warm 1-2_+ Neuro: alert & oriented x 3. No focal deficits. Moves all 4 without problem     Telemetry   NSR 80-80 Personally reviewed   Labs    CBC Recent Labs    10/20/20 0310 10/21/20 0930 10/22/20 0310 10/22/20 0439  WBC 13.8* 15.9* 14.8*  --   NEUTROABS 11.3*  --   --   --   HGB 8.1* 8.4* 8.3* 9.5*  HCT 25.3* 26.6* 27.2* 28.0*  MCV 89.4 89.6 90.4  --   PLT 255 323 327  --    Basic Metabolic Panel Recent Labs    10/21/20 0416 10/21/20 1631 10/22/20 0310 10/22/20 0439  NA 133* 132* 132* 134*  K 4.6 5.3* 4.5 4.6  CL 98 99 97*  --   CO2 25 24 25   --   GLUCOSE 235* 240* 82  --   BUN 26* 27* 26*  --   CREATININE 1.70* 1.87* 1.69*  --   CALCIUM 8.3* 7.4* 8.3*  --   MG 2.6*  --  2.6*  --  PHOS 1.7* 4.9* 1.9*  --    Liver Function Tests Recent Labs    10/21/20 1631 10/22/20 0310  ALBUMIN 2.3* 2.5*   No results for input(s): LIPASE, AMYLASE in the last 72 hours. Cardiac Enzymes No results for input(s): CKTOTAL, CKMB, CKMBINDEX, TROPONINI in the last 72 hours.  BNP: BNP (last 3 results) Recent Labs    10/14/20 0254 10/20/20 0305  BNP 1,097.8* 466.7*    ProBNP (last 3 results) No results for input(s): PROBNP in the last 8760 hours.   D-Dimer No results for input(s): DDIMER in the last 72 hours. Hemoglobin A1C No results for input(s): HGBA1C in the last 72 hours. Fasting Lipid Panel No results for input(s): CHOL, HDL, LDLCALC, TRIG, CHOLHDL, LDLDIRECT in the last 72 hours. Thyroid Function Tests No results for input(s): TSH, T4TOTAL, T3FREE, THYROIDAB in the last 72 hours.  Invalid input(s): FREET3  Other results:   Imaging    No results  found.   Medications:     Scheduled Medications: . sodium chloride   Intravenous Once  . aspirin EC  325 mg Oral Daily   Or  . aspirin  324 mg Per Tube Daily   Or  . aspirin  300 mg Rectal Daily  . B-complex with vitamin C  1 tablet Oral Daily  . Chlorhexidine Gluconate Cloth  6 each Topical Daily  . feeding supplement  237 mL Oral TID BM  . gabapentin  100 mg Oral QHS  . insulin aspart  0-15 Units Subcutaneous Q4H  . insulin detemir  15 Units Subcutaneous BID  . lidocaine  1 patch Transdermal Q24H  . mouth rinse  15 mL Mouth Rinse BID  . metoCLOPramide (REGLAN) injection  5 mg Intravenous Q12H  . midodrine  10 mg Oral TID WC  . pantoprazole  40 mg Oral Daily  . polyethylene glycol  17 g Oral Daily  . senna-docusate  2 tablet Oral BID  . sodium chloride flush  10-40 mL Intracatheter Q12H  . sodium chloride flush  3 mL Intravenous Q12H  . Warfarin - Pharmacist Dosing Inpatient   Does not apply q1600    Infusions: .  prismasol BGK 4/2.5 400 mL/hr at 10/22/20 0404  . sodium chloride Stopped (10/16/20 1426)  . sodium chloride    . sodium chloride 20 mL/hr at 10/17/20 1715  . amiodarone 30 mg/hr (10/22/20 0900)  . epinephrine 4 mcg/min (10/22/20 0900)  . heparin 10,000 units/ 20 mL infusion syringe 400 Units/hr (10/21/20 1405)  . lactated ringers    . lactated ringers Stopped (10/17/20 1655)  . milrinone 0.125 mcg/kg/min (10/22/20 0900)  . potassium PHOSPHATE IVPB (in mmol)    . prismasol BGK 2/2.5 replacement solution 200 mL/hr at 10/21/20 2259  . prismasol BGK 4/2.5 1,500 mL/hr at 10/22/20 0825    PRN Medications: sodium chloride, dextrose, heparin, heparin, heparin, morphine injection, ondansetron (ZOFRAN) IV, oxyCODONE, sodium chloride flush, sodium chloride flush, traMADol, white petrolatum    Assessment/Plan   1.Acute on chronic systolic HF -> cardiogenic shock:ICM.HasMedtronic ICD.EF 15%. Low output persisted despite milrinone and then dobutamine with rise  in creatinine to 2.5. Impella 5.5 placed 3/1.  Creatinine improved to 1.7 and HM3 LVAD was placed on 3/10.  - Patient currently on milrinone 0.125 +epi 4. CO-OX 55% -> 46%. CVP 18 - Repeat co-ox. May need to bump epi back to 5.  - Continue midodrine 10 mg tid.  - Volume remains elevated. CVP 13-14. Volume managed per CRRT . Will continue today.  Increase pull rate as tolerated. Try to keep at -200 2. LVAD - 3/13 VAD speed increased to 5500. - VAD interrogated personally. Parameters stable. - ASA 325 until INR up to 2 then cut aspirin back to 81 mg daily.  INR remains at 1.6 today  - LDH- 293>444>597>593>670>575>571 >485>467> 541 - Heparin running in CRT circuit. D/w TCTS and PharmD - DL site ok 3. CAD s/p [id number]:Last cath in 6/19 with patent grafts.  - Continue Crestor 40 mg daily.  - No s/s angina 4. AKI on CKD Stage 3b: Suspect that this is a combination of cardiorenal syndrome and diabetic nephropathy and possibly ATN.  - developed post-op AKI. Started on CRRT 3/15 - Nephrology appreciated. D/w them at bedside. Continue CRRT. Hopeful for renal recovery 5. Atrial flutter/fibrillation: s/p DC-CV 08/21/18.He has been in rate-controlled atrial fibrillation.  - Continue amio drip 30 mg per hour.  - AC management as 6. Wound R Foot: Partial thickness skin loss noted. Cover with mepilex border and change every 5 days.  7. Right arm pain: Think neuropathic, per Dr. Cyndia Bent patient had stretch of brachial plexus with Impella placement.  8. ID: WBC falling slowly. Afebrile, negative cultures. Follow closely  9. GY:BWLSLHT of very poor control but hgbA1ctrending down. Most recent 7.7%  - Cover with SSI. 10. Anemia  - hgb stable at 8.3  11. Deconditioning - PT/OT following  - will need CIR once medically stable   CRITICAL CARE Performed by: Glori Bickers  Total critical care time: 35 minutes  Critical care time was exclusive of separately billable procedures and treating  other patients.  Critical care was necessary to treat or prevent imminent or life-threatening deterioration.  Critical care was time spent personally by me (independent of midlevel providers or residents) on the following activities: development of treatment plan with patient and/or surrogate as well as nursing, discussions with consultants, evaluation of patient's response to treatment, examination of patient, obtaining history from patient or surrogate, ordering and performing treatments and interventions, ordering and review of laboratory studies, ordering and review of radiographic studies, pulse oximetry and re-evaluation of patient's condition.    Length of Stay: [place], MD  10/22/2020, 9:47 AM  Advanced Heart Failure Team Pager [phone] (M-F; 7a - 4p)  Please contact [facility] for night-coverage after hours (4p -7a ) and weekends on [email or web address]

## 2020-10-23 ENCOUNTER — Inpatient Hospital Stay (HOSPITAL_COMMUNITY): Payer: Medicare HMO

## 2020-10-23 DIAGNOSIS — Z95811 Presence of heart assist device: Secondary | ICD-10-CM | POA: Diagnosis not present

## 2020-10-23 DIAGNOSIS — I5023 Acute on chronic systolic (congestive) heart failure: Secondary | ICD-10-CM | POA: Diagnosis not present

## 2020-10-23 DIAGNOSIS — N179 Acute kidney failure, unspecified: Secondary | ICD-10-CM | POA: Diagnosis not present

## 2020-10-23 LAB — RENAL FUNCTION PANEL
Albumin: 2.4 g/dL — ABNORMAL LOW (ref 3.5–5.0)
Albumin: 2.6 g/dL — ABNORMAL LOW (ref 3.5–5.0)
Anion gap: 8 (ref 5–15)
Anion gap: 8 (ref 5–15)
BUN: 26 mg/dL — ABNORMAL HIGH (ref 6–20)
BUN: 25 mg/dL — ABNORMAL HIGH (ref 6–20)
CO2: 26 mmol/L (ref 22–32)
CO2: 25 mmol/L (ref 22–32)
Calcium: 8.2 mg/dL — ABNORMAL LOW (ref 8.9–10.3)
Calcium: 8.5 mg/dL — ABNORMAL LOW (ref 8.9–10.3)
Chloride: 95 mmol/L — ABNORMAL LOW (ref 98–111)
Chloride: 96 mmol/L — ABNORMAL LOW (ref 98–111)
Creatinine, Ser: 1.85 mg/dL — ABNORMAL HIGH (ref 0.61–1.24)
Creatinine, Ser: 1.83 mg/dL — ABNORMAL HIGH (ref 0.61–1.24)
GFR, Estimated: 41 mL/min — ABNORMAL LOW (ref >60)
GFR, Estimated: 42 mL/min — ABNORMAL LOW (ref >60)
Glucose, Bld: 265 mg/dL — ABNORMAL HIGH (ref 70–99)
Glucose, Bld: 151 mg/dL — ABNORMAL HIGH (ref 70–99)
Phosphorus: 2.3 mg/dL — ABNORMAL LOW (ref 2.5–4.6)
Phosphorus: 2.1 mg/dL — ABNORMAL LOW (ref 2.5–4.6)
Potassium: 5.2 mmol/L — ABNORMAL HIGH (ref 3.5–5.1)
Potassium: 5.2 mmol/L — ABNORMAL HIGH (ref 3.5–5.1)
Sodium: 129 mmol/L — ABNORMAL LOW (ref 135–145)
Sodium: 129 mmol/L — ABNORMAL LOW (ref 135–145)

## 2020-10-23 LAB — CBC
HCT: 27.1 % — ABNORMAL LOW (ref 39.0–52.0)
Hemoglobin: 8.5 g/dL — ABNORMAL LOW (ref 13.0–17.0)
MCH: 28.1 pg (ref 26.0–34.0)
MCHC: 31.4 g/dL (ref 30.0–36.0)
MCV: 89.4 fL (ref 80.0–100.0)
Platelets: 377 10*3/uL (ref 150–400)
RBC: 3.03 MIL/uL — ABNORMAL LOW (ref 4.22–5.81)
RDW: 18.8 % — ABNORMAL HIGH (ref 11.5–15.5)
WBC: 15.4 10*3/uL — ABNORMAL HIGH (ref 4.0–10.5)
nRBC: 1.3 % — ABNORMAL HIGH (ref 0.0–0.2)

## 2020-10-23 LAB — GLUCOSE, CAPILLARY
Glucose-Capillary: 200 mg/dL — ABNORMAL HIGH (ref 70–99)
Glucose-Capillary: 259 mg/dL — ABNORMAL HIGH (ref 70–99)
Glucose-Capillary: 159 mg/dL — ABNORMAL HIGH (ref 70–99)
Glucose-Capillary: 104 mg/dL — ABNORMAL HIGH (ref 70–99)

## 2020-10-23 LAB — COOXEMETRY PANEL
Carboxyhemoglobin: 1.8 % — ABNORMAL HIGH (ref 0.5–1.5)
Methemoglobin: 0.9 % (ref 0.0–1.5)
O2 Saturation: 59.5 %
Total hemoglobin: 8.7 g/dL — ABNORMAL LOW (ref 12.0–16.0)

## 2020-10-23 LAB — PROTIME-INR
INR: 1.8 — ABNORMAL HIGH (ref 0.8–1.2)
Prothrombin Time: 20.6 seconds — ABNORMAL HIGH (ref 11.4–15.2)

## 2020-10-23 LAB — LACTATE DEHYDROGENASE: LDH: 414 U/L — ABNORMAL HIGH (ref 98–192)

## 2020-10-23 LAB — MAGNESIUM: Magnesium: 2.5 mg/dL — ABNORMAL HIGH (ref 1.7–2.4)

## 2020-10-23 MED ORDER — PRISMASOL BGK 0/2.5 32-2.5 MEQ/L EC SOLN
Status: DC
  Filled 2020-10-23 (×9): qty 5000

## 2020-10-23 MED ORDER — WARFARIN SODIUM 2.5 MG PO TABS
2.5000 mg | ORAL_TABLET | Freq: Once | ORAL | Status: AC
  Administered 2020-10-23: 2.5 mg via ORAL
  Filled 2020-10-23: qty 1

## 2020-10-23 NOTE — Progress Notes (Signed)
Patient ID: Marcus Johnson., male   DOB: [birth year], 60 y.o.   MRN: [id number]      Advanced Heart Failure Rounding Note  PCP-Cardiologist: No primary care provider on file.   Subjective:    - 2/27 Milrinone switched to dobutamine 5 mcg.  - 2/28 Moved to ICU. Dobutamine increased to 7.5 mcg and Norepi 3 mcg added. Diuretics held.  - 3/1 Impella 5.5 placed.  - 3/4 Teeth removed - 3/10 HM3 LVAD placed -3/13 VAD speed increased to 5500. [name] out  - 3/14 Nephrology consulted. Given 1UPRBCs  - 3/15 Started CRRT. Given 1UPRBCs.   Epi increased back from 4 -> 5 yesterday for low co-ox/RV failure. Now on milrinone 0.125+ epi 5+ amio 30.  CO-OX 60%  Remains on CRRT currently pulling ~150/hr. Weight down another 10 pounds. Weight 182 (pre-op 175-180). CVP remains at 18 however.  On heparin in CRT circuit only. No bleeding  Continues to feel weak but was able to stand yesterday and get up to chair  LDH 293>444>597>>>>485 >467 > 541 > 414   Echo: EF 20-25%, normal RV, PASP 61, dilated IVC.   LVAD Interrogation HM 3: Speed: 2130 Flow:5.4   PI:2.0   Power: 4.0   VAD interrogated personally. Parameters stable.   Objective:   Weight Range: 82.9 kg Body mass index is 25.85 kg/m.   Vital Signs:   Temp:  [97.3 F (36.3 C)-98.8 F (37.1 C)] 97.3 F (36.3 C) (03/20 0700) Pulse Rate:  [28-118] 92 (03/20 0900) Resp:  [10-26] 15 (03/20 0900) BP: (95-132)/(74-84) 95/84 (03/20 0400) SpO2:  [98 %-100 %] 100 % (03/20 0900) Weight:  [82.9 kg] 82.9 kg (03/20 0500) Last BM Date: 10/21/20  Weight change: Filed Weights   10/21/20 0400 10/22/20 0500 10/23/20 0500  Weight: 89.2 kg 87.3 kg 82.9 kg    Intake/Output:   Intake/Output Summary (Last 24 hours) at 10/23/2020 1017 Last data filed at 10/23/2020 1000 Gross per 24 hour  Intake 3066.37 ml  Output 6424 ml  Net -3357.63 ml      Physical Exam   General:  Sitting up in bed Weak appearing.  HEENT: normal  + cor-trak Neck:  supple.RIJ HD cath Carotids 2+ bilat; no bruits. No lymphadenopathy or thryomegaly appreciated. Cor: LVAD hum. Sternal dressing ok  Lungs: Clear. Abdomen: obese soft, nontender, + distended. No hepatosplenomegaly. No bruits or masses. Good bowel sounds. Driveline site clean. Anchor in place.  Extremities: no cyanosis, clubbing, rash. Warm no edema  Neuro: alert & oriented x 3. No focal deficits. Moves all 4 without problem    Telemetry   NSR 90-100  Personally reviewed   Labs    CBC Recent Labs    10/22/20 0310 10/22/20 0439 10/23/20 0454  WBC 14.8*  --  15.4*  HGB 8.3* 9.5* 8.5*  HCT 27.2* 28.0* 27.1*  MCV 90.4  --  89.4  PLT 327  --  865   Basic Metabolic Panel Recent Labs    10/22/20 0310 10/22/20 0439 10/22/20 1628 10/23/20 0454  NA 132* 134* 130*  --   K 4.5 4.6 5.5*  --   CL 97*  --  94*  --   CO2 25  --  25  --   GLUCOSE 82  --  358*  --   BUN 26*  --  24*  --   CREATININE 1.69*  --  1.75*  --   CALCIUM 8.3*  --  7.8*  --   MG 2.6*  --   --  2.5*  PHOS 1.9*  --  3.7  --    Liver Function Tests Recent Labs    10/22/20 0310 10/22/20 1628  ALBUMIN 2.5* 2.3*   No results for input(s): LIPASE, AMYLASE in the last 72 hours. Cardiac Enzymes No results for input(s): CKTOTAL, CKMB, CKMBINDEX, TROPONINI in the last 72 hours.  BNP: BNP (last 3 results) Recent Labs    10/14/20 0254 10/20/20 0305  BNP 1,097.8* 466.7*    ProBNP (last 3 results) No results for input(s): PROBNP in the last 8760 hours.   D-Dimer No results for input(s): DDIMER in the last 72 hours. Hemoglobin A1C No results for input(s): HGBA1C in the last 72 hours. Fasting Lipid Panel No results for input(s): CHOL, HDL, LDLCALC, TRIG, CHOLHDL, LDLDIRECT in the last 72 hours. Thyroid Function Tests No results for input(s): TSH, T4TOTAL, T3FREE, THYROIDAB in the last 72 hours.  Invalid input(s): FREET3  Other results:   Imaging    DG Chest Port 1 View  Result Date:  10/23/2020 CLINICAL DATA:  History of open heart surgery. Evaluate tube and catheter position, pneumothorax, pleural effusion, atelectasis. EXAM: PORTABLE CHEST 1 VIEW COMPARISON:  Prior chest radiograph 10/20/2020 and earlier. FINDINGS: Left chest single lead AICD device. Left ventricular cyst device. Abandoned epicardial pacing wires. An enteric tube passes below the level of left hemidiaphragm with tip excluded from the field of view. Unchanged position of a right IJ approach central venous catheter with tip projecting in the region of the superior cavoatrial junction. Prior median sternotomy/CABG. Unchanged cardiomegaly. Central pulmonary vascular congestion. Persistent small left pleural effusion. Superimposed hazy opacity within the left mid to lower lung field, slightly increased from the prior examination, and favored to reflect subsegmental atelectasis. The right lung remains clear. No right pleural effusion. No evidence of pneumothorax. IMPRESSION: Support apparatus as described. Persistent small left pleural effusion. Superimposed hazy opacity within the left mid to lower lung field, slightly increased from the prior exam, and favored to reflect subsegmental atelectasis. Unchanged cardiomegaly and central pulmonary vascular congestion. Electronically Signed   By: Kellie Simmering DO   On: 10/23/2020 08:56     Medications:     Scheduled Medications: . aspirin EC  325 mg Oral Daily   Or  . aspirin  324 mg Per Tube Daily   Or  . aspirin  300 mg Rectal Daily  . B-complex with vitamin C  1 tablet Oral Daily  . Chlorhexidine Gluconate Cloth  6 each Topical Daily  . feeding supplement  237 mL Oral TID BM  . gabapentin  100 mg Oral QHS  . insulin aspart  0-15 Units Subcutaneous TID WC  . insulin aspart  0-5 Units Subcutaneous QHS  . insulin detemir  15 Units Subcutaneous BID  . lidocaine  1 patch Transdermal Q24H  . mouth rinse  15 mL Mouth Rinse BID  . metoCLOPramide (REGLAN) injection  5 mg  Intravenous Q12H  . midodrine  10 mg Oral TID WC  . pantoprazole  40 mg Oral Daily  . polyethylene glycol  17 g Oral Daily  . senna-docusate  2 tablet Oral BID  . sodium chloride flush  10-40 mL Intracatheter Q12H  . Warfarin - Pharmacist Dosing Inpatient   Does not apply q1600    Infusions: .  prismasol BGK 4/2.5 400 mL/hr at 10/23/20 0743  . amiodarone 30 mg/hr (10/23/20 1000)  . epinephrine 5 mcg/min (10/23/20 1000)  . heparin 10,000 units/ 20 mL infusion syringe Stopped (10/23/20 0030)  .  lactated ringers    . lactated ringers Stopped (10/17/20 1655)  . milrinone 0.125 mcg/kg/min (10/23/20 1000)  . prismasol BGK 2/2.5 replacement solution 200 mL/hr at 10/23/20 0117  . prismasol BGK 4/2.5 1,500 mL/hr at 10/23/20 0845    PRN Medications: dextrose, heparin, heparin, morphine injection, ondansetron (ZOFRAN) IV, oxyCODONE, sodium chloride flush, traMADol, white petrolatum    Assessment/Plan   1.Acute on chronic systolic HF -> cardiogenic shock:ICM.HasMedtronic ICD.EF 15%. Low output persisted despite milrinone and then dobutamine with rise in creatinine to 2.5. Impella 5.5 placed 3/1.  Creatinine improved to 1.7 and HM3 LVAD was placed on 3/10.  - Patient currently on milrinone 0.125 +epi 5 CO-OX 60% - Volume status improving. Nearing pre-op weight but CVP remains very high despite volume removal. Suspect due to RV failure. Will continue epi/milrinone at current dose. Will continue volume removal as long as tolerating. - Continue midodrine 10 mg tid.  2. LVAD - 3/13 VAD speed increased to 5500. - VAD interrogated personally. Parameters stable. - ASA 325 until INR up to 2 then cut aspirin back to 81 mg daily.  INR remains at 1.8 today  - LDH- 293>444>597>>571 >485>467> 541> 414 - Heparin running in CRT circuit.  D/w TCTS and PharmD - DL site ok 3. CAD s/p [id number]:Last cath in 6/19 with patent grafts.  - Continue Crestor 40 mg daily.  - No s/s angina 4. AKI on CKD  Stage 3b: Suspect that this is a combination of cardiorenal syndrome and diabetic nephropathy and possibly ATN.  - developed post-op AKI. Started on CRRT 3/15 - Nephrology appreciated. D/w Renal. Continue CRRT. Hopeful for renal recovery 5. Atrial flutter/fibrillation: s/p DC-CV 08/21/18.He has been in rate-controlled atrial fibrillation.  - Continue amio drip 30 mg per hour.  - AC management as above 6. Wound R Foot: Partial thickness skin loss noted. Cover with mepilex border and change every 5 days.  7. Right arm pain: Think neuropathic, per Dr. Cyndia Bent patient had stretch of brachial plexus with Impella placement.  8. ID: WBC falling slowly. Afebrile, negative cultures. Follow closely  9. TK:ZSWFUXN of very poor control but hgbA1ctrending down. Most recent 7.7%  - Cover with SSI. 10. Anemia  - hgb stable at 8.5 11. Deconditioning - PT/OT following  - will need CIR once medically stable   CRITICAL CARE Performed by: Glori Bickers  Total critical care time: 35 minutes  Critical care time was exclusive of separately billable procedures and treating other patients.  Critical care was necessary to treat or prevent imminent or life-threatening deterioration.  Critical care was time spent personally by me (independent of midlevel providers or residents) on the following activities: development of treatment plan with patient and/or surrogate as well as nursing, discussions with consultants, evaluation of patient's response to treatment, examination of patient, obtaining history from patient or surrogate, ordering and performing treatments and interventions, ordering and review of laboratory studies, ordering and review of radiographic studies, pulse oximetry and re-evaluation of patient's condition.    Length of Stay: [place], MD  10/23/2020, 10:17 AM  Advanced Heart Failure Team Pager [phone] (M-F; 7a - 4p)  Please contact [facility] for night-coverage  after hours (4p -7a ) and weekends on [email or web address]

## 2020-10-23 NOTE — Progress Notes (Signed)
ANTICOAGULATION CONSULT NOTE - Follow Up Consult  Pharmacy Consult for Warfarin Indication: LVAD  Allergies  Allergen Reactions  . Meclizine Hcl Anaphylaxis and Swelling  . Ivabradine Nausea Only    Patient Measurements: Height: 5' 10.5" (179.1 cm) Weight: 82.9 kg (182 lb 12.2 oz) IBW/kg (Calculated) : 74.15 Heparin Dosing Weight: 89.2 kg  Vital Signs: Temp: 97.9 F (36.6 C) (03/20 1100) Temp Source: Axillary (03/20 0700) BP: 95/84 (03/20 0400) Pulse Rate: 129 (03/20 1100)  Labs: Recent Labs     0000 10/21/20 0416 10/21/20 0930 10/21/20 1631 10/21/20 1818 10/22/20 0310 10/22/20 0439 10/22/20 1628 10/23/20 0454  HGB   < >  --  8.4*  --   --  8.3* 9.5*  --  8.5*  HCT   < >  --  26.6*  --   --  27.2* 28.0*  --  27.1*  PLT  --   --  323  --   --  327  --   --  377  APTT  --   --   --   --   --  44*  --   --   --   LABPROT  --  18.5*  --   --   --  18.6*  --   --  20.6*  INR  --  1.6*  --   --   --  1.6*  --   --  1.8*  HEPARINUNFRC  --   --   --   --  <0.10* <0.10*  --   --   --   CREATININE  --  1.70*  --    < >  --  1.69*  --  1.75* 1.85*   < > = values in this interval not displayed.    Estimated Creatinine Clearance: 45.1 mL/min (A) (by C-G formula based on SCr of 1.85 mg/dL (H)).   Medical History: Past Medical History:  Diagnosis Date  . AICD (automatic cardioverter/defibrillator) present    Single-chamber  implantable cardiac defibrillator - Medtronic  . Atrial fibrillation ([place])   . Cataract    Mixed form OU  . CHF (congestive heart failure) ([place])   . Chronic kidney disease   . Chronic kidney disease (CKD), stage III (moderate) (HCC)   . Chronic systolic heart failure ([place])    a. Echo 5/13: Mild LVE, mild LVH, EF 10%, anteroseptal, lateral, apical AK, mild MR, mild LAE, moderately reduced RVSF, mild RAE, PASP 60;  b. 07/2014 Echo: EF 20-2%, diff HK, AKI of antsep/apical/mid-apicalinferior, mod reduced RV.  Marland Kitchen Coronary artery disease    a. s/p CABG  2002 b. LHC 5/13:  dLM 80%, LAD subtotally occluded, pCFX occluded, pRCA 50%, mid? Occlusion with high take off of the PDA with 70% multiple lesions-not bypassed and supplies collaterals to LAD, LIMA-IM/ramus ok, S-OM ok, S-PLA branches ok. Medical therapy was recommended  . COVID   . Diabetic retinopathy (HCC)    NPDR OD, PDR OS  . Dyspnea   . Gout    "on daily RX" (01/08/2018)  . Hypertension   . Hypertensive retinopathy    OU  . Ischemic cardiomyopathy    a. Echo 5/13: Mild LVE, mild LVH, EF 10%, anteroseptal, lateral, apical AK, mild MR, mild LAE, moderately reduced RVSF, mild RAE, PASP 60;  b. 01/2012 s/p MDT [id number] Protecta XT VR AICD;  c. 07/2014 Echo: EF 20-2%, diff HK, AKI of antsep/apical/mid-apicalinferior, mod reduced RV.  Marland Kitchen MRSA (methicillin resistant Staphylococcus aureus)  Status post right foot plantar deep infection with MRSA status post  I&D 10/2008  . Myocardial infarction [facility])    "was told I'd had several before heart OR in 2002" (01/08/2018)  . Noncompliance   . Peripheral neuropathy   . Retinopathy, diabetic, background ([place])   . Syncope   . Type II diabetes mellitus ([place])    requiring insulin   . Vitreous hemorrhage, left (HCC)    and proliferative diabetic retinopathy    Assessment: 60 yo M s/p new LVAD implant with HeartMate III on 3/10. Warfarin started per MD on 3/12, and pharmacy asked to take over warfarin dosing.   INR remains subtherapeutic at 1.8 today after several days of warfarin 5mg  - will back down slightly today. Also adjustingTF and ensure so varying amounts vitamin K. Cautious warfarin dosing CBC stable. No overt bleeding.   Goal of Therapy:  INR 2-2.5 Monitor platelets by anticoagulation protocol: Yes   Plan:  Warfarin 2.5 mg PO tonight repeat Daily PT/INR, CBC, monitor s/sx bleeding Monitor aPTT/HL while receiving heparin through CRRT to ensure not supratherapeutic   Bonnita Nasuti Pharm.D. CPP, BCPS Clinical  Pharmacist [phone] 10/23/2020 12:46 PM    Please check [email or web address] for unit-specific pharmacy phone numbers.

## 2020-10-23 NOTE — Progress Notes (Signed)
Drive line dressing change:  LVAD driveline dressing was changed using proper sterile technique. Old dressing removed. Driveline site cleaned using chloroprep x2. Silver strip applied. New gauze dressing applied using sterile technique. Driveline secured at driveline site with 1 suture. Driveline site clean, moderate serosanguinous drainage at the drive line site, no redness, no swelling, no tenderness, no odor from drainage. 1cm x1cm Skin tear assessed underneath drive line dressing below the site of the drive line when dressing was removed. Tegaderm placed on skin tear using sterile technique. Drive line anchor intact and applied correctly.   NEXT DRESSING CHANGE DUE 10/24/2020.   Alma Friendly RN

## 2020-10-23 NOTE — Progress Notes (Signed)
[facility] NEPHROLOGY PROGRESS NOTE  Assessment/ Plan:  # Acute kidney injury on chronic kidney disease stage IIIa: With underlying chronic kidney disease likely from diabetes/hypertension and worsening renal function from hemodynamically mediated ATN versus cardiorenal syndrome.   Tolerating CRRT well, CVP is still around 13-14, plan to continue CRRT for ultrafiltration with reassessment of volume status/labs daily to direct further management including ability to transition to IHD.  Await morning BMP to direct CRRT prescription.  Discussed with RN, no problem with filter.  # Acute exacerbation of chronic systolic heart failure:With evidence of cardiogenic shock status post LVAD. Remains on pressors with epinephrine, milrinone.  Volume status/UF managed with CRRT.  # Anemia:Likely secondary to chronic disease and exacerbated by recent surgical losses.  Transfuse PRBC as needed.  # Atrial fibrillation: Intermittently tachycardic, continue amiodarone and on Coumadin   #Hypophosphatemia: Repleted IV phosphate, requiring daily.  Follow-up lab result.  Subjective: Seen and examined in ICU.  Patient is alert awake.  Tolerating CRRT without any problem.  Able to ultrafiltrate around 150 cc an hour.  Currently on epinephrine, milrinone and amiodarone.  No new event.  Objective Vital signs in last 24 hours: Vitals:   10/23/20 0400 10/23/20 0500 10/23/20 0600 10/23/20 0700  BP: 95/84     Pulse: 94 93 (!) 101   Resp: 15 18 (!) 21 17  Temp:    (!) 97.3 F (36.3 C)  TempSrc:    Axillary  SpO2: 99% 100% 100% 98%  Weight:  82.9 kg    Height:       Weight change: -4.4 kg  Intake/Output Summary (Last 24 hours) at 10/23/2020 0813 Last data filed at 10/23/2020 0700 Gross per 24 hour  Intake 2439.72 ml  Output 6532 ml  Net -4092.28 ml       Labs: Basic Metabolic Panel: Recent Labs  Lab 10/21/20 1631 10/22/20 0310 10/22/20 0439 10/22/20 1628  NA 132* 132* 134* 130*   K 5.3* 4.5 4.6 5.5*  CL 99 97*  --  94*  CO2 24 25  --  25  GLUCOSE 240* 82  --  358*  BUN 27* 26*  --  24*  CREATININE 1.87* 1.69*  --  1.75*  CALCIUM 7.4* 8.3*  --  7.8*  PHOS 4.9* 1.9*  --  3.7   Liver Function Tests: Recent Labs  Lab 10/21/20 1631 10/22/20 0310 10/22/20 1628  ALBUMIN 2.3* 2.5* 2.3*   No results for input(s): LIPASE, AMYLASE in the last 168 hours. No results for input(s): AMMONIA in the last 168 hours. CBC: Recent Labs  Lab 10/18/20 0409 10/18/20 1538 10/19/20 0304 10/19/20 0324 10/20/20 0310 10/21/20 0930 10/22/20 0310 10/22/20 0439 10/23/20 0454  WBC 17.9*   < > 17.7*  --  13.8* 15.9* 14.8*  --  15.4*  NEUTROABS 14.7*  --  14.8*  --  11.3*  --   --   --   --   HGB 7.0*   < > 8.5*   < > 8.1* 8.4* 8.3* 9.5* 8.5*  HCT 22.3*   < > 26.8*   < > 25.3* 26.6* 27.2* 28.0* 27.1*  MCV 91.0   < > 88.7  --  89.4 89.6 90.4  --  89.4  PLT 246   < > 262  --  255 323 327  --  377   < > = values in this interval not displayed.   Cardiac Enzymes: No results for input(s): CKTOTAL, CKMB, CKMBINDEX, TROPONINI in the last 168  hours. CBG: Recent Labs  Lab 10/22/20 1105 10/22/20 1658 10/22/20 1937 10/22/20 2226 10/23/20 0626  GLUCAP 213* 300* 231* 259* 200*    Iron Studies: No results for input(s): IRON, TIBC, TRANSFERRIN, FERRITIN in the last 72 hours. Studies/Results: No results found.  Medications: Infusions: .  prismasol BGK 4/2.5 400 mL/hr at 10/23/20 0743  . amiodarone 30 mg/hr (10/23/20 0700)  . epinephrine 5 mcg/min (10/23/20 0700)  . heparin 10,000 units/ 20 mL infusion syringe Stopped (10/23/20 0030)  . lactated ringers    . lactated ringers Stopped (10/17/20 1655)  . milrinone 0.125 mcg/kg/min (10/23/20 0700)  . prismasol BGK 2/2.5 replacement solution 200 mL/hr at 10/23/20 0117  . prismasol BGK 4/2.5 1,500 mL/hr at 10/23/20 0443    Scheduled Medications: . aspirin EC  325 mg Oral Daily   Or  . aspirin  324 mg Per Tube Daily   Or  .  aspirin  300 mg Rectal Daily  . B-complex with vitamin C  1 tablet Oral Daily  . Chlorhexidine Gluconate Cloth  6 each Topical Daily  . feeding supplement  237 mL Oral TID BM  . gabapentin  100 mg Oral QHS  . insulin aspart  0-15 Units Subcutaneous TID WC  . insulin aspart  0-5 Units Subcutaneous QHS  . insulin detemir  15 Units Subcutaneous BID  . lidocaine  1 patch Transdermal Q24H  . mouth rinse  15 mL Mouth Rinse BID  . metoCLOPramide (REGLAN) injection  5 mg Intravenous Q12H  . midodrine  10 mg Oral TID WC  . pantoprazole  40 mg Oral Daily  . polyethylene glycol  17 g Oral Daily  . senna-docusate  2 tablet Oral BID  . sodium chloride flush  10-40 mL Intracatheter Q12H  . Warfarin - Pharmacist Dosing Inpatient   Does not apply q1600    have reviewed scheduled and prn medications.  Physical Exam: General:NAD, looking tired, not in distress Heart: Mechanical heart sound Lungs: Coarse breath sound bilateral Abdomen:soft, Non-tender, non-distended Extremities:LE edema+ Dialysis Access: Temporary HD catheter.  Marcus Johnson 10/23/2020,8:13 AM  LOS: 25 days

## 2020-10-23 NOTE — Progress Notes (Signed)
10 Days Post-Op Procedure(s) (LRB): REDO STERNOTOMY (N/A) INSERTION OF IMPLANTABLE LEFT VENTRICULAR ASSIST DEVICE (N/A) TRANSESOPHAGEAL ECHOCARDIOGRAM (TEE) (N/A) REMOVAL OF IMPELLA LEFT VENTRICULAR ASSIST DEVICE Subjective: No complaints  Objective: Vital signs in last 24 hours: Temp:  [97.3 F (36.3 C)-98.8 F (37.1 C)] 98.2 F (36.8 C) (03/20 1500) Pulse Rate:  [32-129] 35 (03/20 1700) Cardiac Rhythm: Normal sinus rhythm (03/20 0800) Resp:  [10-23] 21 (03/20 1900) BP: (95-132)/(74-84) 95/84 (03/20 0400) SpO2:  [97 %-100 %] 98 % (03/20 1700) Weight:  [82.9 kg] 82.9 kg (03/20 0500)  Hemodynamic parameters for last 24 hours: CVP:  [1 mmHg-19 mmHg] 13 mmHg  Intake/Output from previous day: 03/19 0701 - 03/20 0700 In: 3073.3 [P.O.:1605; I.V.:961.6; IV Piggyback:506.7] Out: 9629  Intake/Output this shift: No intake/output data recorded.  General appearance: alert and cooperative Neurologic: intact Heart: mech hum Wound: c/d/i  Lab Results: Recent Labs    10/22/20 0310 10/22/20 0439 10/23/20 0454  WBC 14.8*  --  15.4*  HGB 8.3* 9.5* 8.5*  HCT 27.2* 28.0* 27.1*  PLT 327  --  377   BMET:  Recent Labs    10/23/20 0454 10/23/20 1604  NA 129* 129*  K 5.2* 5.2*  CL 95* 96*  CO2 26 25  GLUCOSE 265* 151*  BUN 26* 25*  CREATININE 1.85* 1.83*  CALCIUM 8.2* 8.5*    PT/INR:  Recent Labs    10/23/20 0454  LABPROT 20.6*  INR 1.8*   ABG    Component Value Date/Time   PHART 7.565 (H) 10/22/2020 0439   HCO3 25.1 10/22/2020 0439   TCO2 26 10/22/2020 0439   ACIDBASEDEF 1.0 10/19/2020 0324   O2SAT 59.5 10/23/2020 0454   CBG (last 3)  Recent Labs    10/23/20 0626 10/23/20 1105 10/23/20 1553  GLUCAP 200* 259* 159*    Assessment/Plan: S/P Procedure(s) (LRB): REDO STERNOTOMY (N/A) INSERTION OF IMPLANTABLE LEFT VENTRICULAR ASSIST DEVICE (N/A) TRANSESOPHAGEAL ECHOCARDIOGRAM (TEE) (N/A) REMOVAL OF IMPELLA LEFT VENTRICULAR ASSIST DEVICE Mobilize continue  present management   LOS: 25 days    Wonda Olds 10/23/2020

## 2020-10-24 DIAGNOSIS — Z95811 Presence of heart assist device: Secondary | ICD-10-CM | POA: Diagnosis not present

## 2020-10-24 DIAGNOSIS — I5023 Acute on chronic systolic (congestive) heart failure: Secondary | ICD-10-CM | POA: Diagnosis not present

## 2020-10-24 LAB — RENAL FUNCTION PANEL
Albumin: 2.4 g/dL — ABNORMAL LOW (ref 3.5–5.0)
Albumin: 2.4 g/dL — ABNORMAL LOW (ref 3.5–5.0)
Anion gap: 9 (ref 5–15)
Anion gap: 10 (ref 5–15)
BUN: 24 mg/dL — ABNORMAL HIGH (ref 6–20)
BUN: 26 mg/dL — ABNORMAL HIGH (ref 6–20)
CO2: 23 mmol/L (ref 22–32)
CO2: 24 mmol/L (ref 22–32)
Calcium: 7.7 mg/dL — ABNORMAL LOW (ref 8.9–10.3)
Calcium: 8.2 mg/dL — ABNORMAL LOW (ref 8.9–10.3)
Chloride: 100 mmol/L (ref 98–111)
Chloride: 96 mmol/L — ABNORMAL LOW (ref 98–111)
Creatinine, Ser: 1.86 mg/dL — ABNORMAL HIGH (ref 0.61–1.24)
Creatinine, Ser: 1.88 mg/dL — ABNORMAL HIGH (ref 0.61–1.24)
GFR, Estimated: 41 mL/min — ABNORMAL LOW (ref >60)
GFR, Estimated: 41 mL/min — ABNORMAL LOW (ref >60)
Glucose, Bld: 184 mg/dL — ABNORMAL HIGH (ref 70–99)
Glucose, Bld: 243 mg/dL — ABNORMAL HIGH (ref 70–99)
Phosphorus: 2.1 mg/dL — ABNORMAL LOW (ref 2.5–4.6)
Phosphorus: 2.2 mg/dL — ABNORMAL LOW (ref 2.5–4.6)
Potassium: 4.8 mmol/L (ref 3.5–5.1)
Potassium: 4.7 mmol/L (ref 3.5–5.1)
Sodium: 132 mmol/L — ABNORMAL LOW (ref 135–145)
Sodium: 130 mmol/L — ABNORMAL LOW (ref 135–145)

## 2020-10-24 LAB — CBC
HCT: 27.6 % — ABNORMAL LOW (ref 39.0–52.0)
Hemoglobin: 8.5 g/dL — ABNORMAL LOW (ref 13.0–17.0)
MCH: 27.9 pg (ref 26.0–34.0)
MCHC: 30.8 g/dL (ref 30.0–36.0)
MCV: 90.5 fL (ref 80.0–100.0)
Platelets: 393 10*3/uL (ref 150–400)
RBC: 3.05 MIL/uL — ABNORMAL LOW (ref 4.22–5.81)
RDW: 18.9 % — ABNORMAL HIGH (ref 11.5–15.5)
WBC: 16.2 10*3/uL — ABNORMAL HIGH (ref 4.0–10.5)
nRBC: 0.8 % — ABNORMAL HIGH (ref 0.0–0.2)

## 2020-10-24 LAB — GLUCOSE, CAPILLARY
Glucose-Capillary: 137 mg/dL — ABNORMAL HIGH (ref 70–99)
Glucose-Capillary: 284 mg/dL — ABNORMAL HIGH (ref 70–99)
Glucose-Capillary: 203 mg/dL — ABNORMAL HIGH (ref 70–99)

## 2020-10-24 LAB — COOXEMETRY PANEL
Carboxyhemoglobin: 2 % — ABNORMAL HIGH (ref 0.5–1.5)
Methemoglobin: 1 % (ref 0.0–1.5)
O2 Saturation: 67.2 %
Total hemoglobin: 8.6 g/dL — ABNORMAL LOW (ref 12.0–16.0)

## 2020-10-24 LAB — MAGNESIUM
Magnesium: 2.4 mg/dL (ref 1.7–2.4)
Magnesium: 2.5 mg/dL — ABNORMAL HIGH (ref 1.7–2.4)

## 2020-10-24 LAB — PROTIME-INR
INR: 1.9 — ABNORMAL HIGH (ref 0.8–1.2)
Prothrombin Time: 21.5 seconds — ABNORMAL HIGH (ref 11.4–15.2)

## 2020-10-24 LAB — LACTATE DEHYDROGENASE: LDH: 370 U/L — ABNORMAL HIGH (ref 98–192)

## 2020-10-24 MED ORDER — WARFARIN SODIUM 2.5 MG PO TABS
2.5000 mg | ORAL_TABLET | Freq: Once | ORAL | Status: AC
  Administered 2020-10-24: 2.5 mg via ORAL
  Filled 2020-10-24: qty 1

## 2020-10-24 NOTE — Progress Notes (Addendum)
At 1006 while RN and nurse tech used [place] to move patient from chair to bed, patient became shaky, reported feeling extreme dizziness and had a glazed, fixed gaze. Patient remained responsive and oriented. LVAD history revealed PI values ranging from 3.5-8.4 with flows from 5.1-5.3. Once patient was in bed supine he reported feeling 'better' and RN noted shaking had ceased. PI returned to 2.0-2.6 range.

## 2020-10-24 NOTE — Progress Notes (Signed)
Bed scale is about 15kg less than yesterday. Will attempt to weigh when patient gets out of bed.

## 2020-10-24 NOTE — Progress Notes (Signed)
Occupational Therapy Treatment Note  Assessing use of foam strap to increase thumb opposition. Pt with increased functional pinch. PT given additional activities to [facility] on with staff. May benefit form MP blocking splint. Will continue to assess. Pt would like to have more accessibility with his phone - he is having difficulty due to R hand deficits. Will continue to follow. Pt very appreciative.     10/24/20 1600  OT Visit Information  Last OT Received On 10/24/20  Assistance Needed +2  History of Present Illness 60 y.o. male admitted 2/28 with ICM for LVAD workup. Impella placed 3/1. Heartmate 3 LVAD placed 3/10. CRRT started 3/15. PMHx: DM2, CAD s/p CABG 5093, systolic HF due to ischemic cardiomyopathy with EF 20-25% (echo 12/15), DM2 and CKD. He is s/p Medtronic single chamber ICD. Covid 1/22  Pain Assessment  Faces Pain Scale 4  Pain Location chest/abdomen  Pain Descriptors / Indicators Discomfort;Grimacing  OT - End of Session  Activity Tolerance Patient tolerated treatment well  Patient left in bed;with call bell/phone within reach;with nursing/sitter in room  Nurse Communication Other (comment)  OT Assessment/Plan  OT Plan Discharge plan remains appropriate  OT Visit Diagnosis Unsteadiness on feet (R26.81);Other abnormalities of gait and mobility (R26.89);Muscle weakness (generalized) (M62.81);Other symptoms and signs involving cognitive function;Dizziness and giddiness (R42)  Pain - Right/Left Right  Pain - part of body Arm  OT Frequency (ACUTE ONLY) Min 2X/week  Recommendations for Other Services Rehab consult  Follow Up Recommendations CIR;Supervision/Assistance - 24 hour  OT Equipment Other (comment)  AM-PAC OT "6 Clicks" Daily Activity Outcome Measure (Version 2)  Help from another person eating meals? 3  Help from another person taking care of personal grooming? 2  Help from another person toileting, which includes using toliet, bedpan, or urinal? 1  Help from another  person bathing (including washing, rinsing, drying)? 1  Help from another person to put on and taking off regular upper body clothing? 1  Help from another person to put on and taking off regular lower body clothing? 1  6 Click Score 9  OT Goal Progression  Progress towards OT goals Progressing toward goals  Acute Rehab OT Goals  Patient Stated Goal to go home and be with his grandchildren  OT Goal Formulation With patient  Time For Goal Achievement 10/31/20  Potential to Achieve Goals Good  ADL Goals  Pt Will Perform Eating with set-up;with adaptive utensils  Pt Will Perform Grooming with set-up;sitting;with adaptive equipment  Pt Will Transfer to Toilet with min guard assist;ambulating  Pt/caregiver will Perform Home Exercise Program Increased ROM;Increased strength;Both right and left upper extremity;With minimal assist;With written HEP provided  Additional ADL Goal #1 Pt will be able to recall sternal precautions  Additional ADL Goal #2 Pt will be able to change out his lines from battery<>wall with min A  OT Time Calculation  OT Start Time (ACUTE ONLY) 1520  OT Stop Time (ACUTE ONLY) 1544  OT Time Calculation (min) 24 min  OT General Charges  $OT Visit 1 Visit  OT Treatments  $Therapeutic Activity 8-22 mins  $Neuromuscular Re-education 8-22 mins  Maurie Boettcher, OT/L   Acute OT Clinical Specialist [place] Pager [phone] Office [phone]

## 2020-10-24 NOTE — Progress Notes (Signed)
CSW met with patient at bedside. Patient shared that his family has visited bedside and he is concerned that his brother is really worried about him. CSW and patient discussed options for conversation starters with his brother to assure him that patient continues to be motivated for recovery. Patient was with OT and attempting to complete some hand exercises. He reports that he was able to get out of bed to chair for an hour today but became fatigued. CSW provided supportive intervention and encouragement. CSW continues to follow throughout implant hospitalization. Raquel Sarna, [place], [place]

## 2020-10-24 NOTE — Progress Notes (Signed)
Physical Therapy Treatment Patient Details Name: Marcus Johnson. MRN: [id number] DOB: 05/13/1961 Today's Date: 10/24/2020    History of Present Illness 60 y.o. male admitted 2/28 with ICM for LVAD workup. Impella placed 3/1. Heartmate 3 LVAD placed 3/10. CRRT started 3/15. PMHx: DM2, CAD s/p CABG 6629, systolic HF due to ischemic cardiomyopathy with EF 20-25% (echo 12/15), DM2 and CKD. He is s/p Medtronic single chamber ICD. Covid 1/22    PT Comments    Pt pleasant with improved awareness of precautions and LVAD parts today. Pt with increased stiffness of bil LE with movement in bed and continued dizziness/presyncope in standing limiting ability to remain standing. Pt with grossly 15 sec of lack of responsiveness with pivot to chair. Pt educated for progressive mobility and HEP with symptoms limiting progression. RN present throughout to assist with CRRT management.   Speed 5500, flow 5.3, PI 2.2, power 4.1 HR 94-98 SpO2 100% RA MAP 80 pre activity    Follow Up Recommendations  Supervision/Assistance - 24 hour;CIR     Equipment Recommendations  Other (comment) (TBD)    Recommendations for Other Services       Precautions / Restrictions Precautions Precautions: Fall;Sternal Precaution Comments: LVAD, CRRT, dizziness/presyncope in standing    Mobility  Bed Mobility Overal bed mobility: Needs Assistance Bed Mobility: Rolling;Sidelying to Sit Rolling: Min assist Sidelying to sit: Mod assist       General bed mobility comments: cues for sequence with assist to roll right to get off cords then transition to sitting with significant assist to bring legs off bed as pt very stiff with bil LE movement in bed then min assist to bring trunk off surface    Transfers Overall transfer level: Needs assistance   Transfers: Sit to/from Stand;Stand Pivot Transfers Sit to Stand: Min assist;+2 physical assistance Stand pivot transfers: Mod assist;+2 physical assistance        General transfer comment: min +2 assist with belt to stand from bed x 2 with cues for sequence and counting to stand. pt with good stability in standing but with pivot to chair pt with noted lack of response with last step back to chair requiring physical assist to move leg and sit. pt with noted slight jerking of all extremities in sitting and grossly 15 sec pause before pt able to verbally respond, eyes open throughout  Ambulation/Gait             General Gait Details: not yet able   Stairs             Wheelchair Mobility    Modified Rankin (Stroke Patients Only)       Balance Overall balance assessment: Needs assistance   Sitting balance-Leahy Scale: Fair Sitting balance - Comments: EOb without physical assist   Standing balance support: Bilateral upper extremity supported Standing balance-Leahy Scale: Poor Standing balance comment: bil UE support in standing limited by dizziness                            Cognition Arousal/Alertness: Awake/alert Behavior During Therapy: Flat affect Overall Cognitive Status: Impaired/Different from baseline Area of Impairment: Memory                     Memory: Decreased short-term memory;Decreased recall of precautions Following Commands: Follows one step commands consistently     Problem Solving: Slow processing General Comments: pt able to recall staff and 3/4 precautions as well as name  and function of controller. Pt with dizziness in standing and with pivoting assumed presyncope with pt eyes open but lack of response to last command to step back and extremity jerking with initial sitting in chair      Exercises General Exercises - Upper Extremity Shoulder Flexion: Left;Seated;5 reps;AAROM General Exercises - Lower Extremity Long Arc QuadSinclair Ship;Both;Seated;20 reps Hip ABduction/ADduction: AAROM;Both;Seated;20 reps Hip Flexion/Marching: AROM;Both;Seated;20 reps Heel Raises: AROM;Both;10  reps;Seated    General Comments        Pertinent Vitals/Pain Pain Assessment: 0-10 Pain Score: 5  Pain Location: sacrum with sitting Pain Descriptors / Indicators: Aching;Sore;Constant Pain Intervention(s): Limited activity within patient's tolerance;Monitored during session;Repositioned    Home Living                      Prior Function            PT Goals (current goals can now be found in the care plan section) Progress towards PT goals: Progressing toward goals    Frequency    Min 4X/week      PT Plan Current plan remains appropriate    Co-evaluation              AM-PAC PT "6 Clicks" Mobility   Outcome Measure  Help needed turning from your back to your side while in a flat bed without using bedrails?: A Little Help needed moving from lying on your back to sitting on the side of a flat bed without using bedrails?: A Lot Help needed moving to and from a bed to a chair (including a wheelchair)?: A Lot Help needed standing up from a chair using your arms (e.g., wheelchair or bedside chair)?: A Little Help needed to walk in hospital room?: Total Help needed climbing 3-5 steps with a railing? : Total 6 Click Score: 12    End of Session Equipment Utilized During Treatment: Gait belt Activity Tolerance: Patient tolerated treatment well Patient left: in chair;with call bell/phone within reach;with nursing/sitter in room Nurse Communication: Mobility status;Precautions PT Visit Diagnosis: Unsteadiness on feet (R26.81);Other abnormalities of gait and mobility (R26.89);Muscle weakness (generalized) (M62.81);Difficulty in walking, not elsewhere classified (R26.2)     Time: 4827-0786 PT Time Calculation (min) (ACUTE ONLY): 39 min  Charges:  $Therapeutic Exercise: 8-22 mins $Therapeutic Activity: 23-37 mins                     Maija P, PT Acute Rehabilitation Services Pager: [phone] Office: [phone]    Maija B Prater 10/24/2020, 10:18  AM

## 2020-10-24 NOTE — Progress Notes (Signed)
Patient ID: Sabas Frett., male   DOB: July 14, 1961, 60 y.o.   MRN: [id number]     Advanced Heart Failure Rounding Note  PCP-Cardiologist: No primary care provider on file.   Subjective:    - 2/27 Milrinone switched to dobutamine 5 mcg.  - 2/28 Moved to ICU. Dobutamine increased to 7.5 mcg and Norepi 3 mcg added. Diuretics held.  - 3/1 Impella 5.5 placed.  - 3/4 Teeth removed - 3/10 HM3 LVAD placed -3/13 VAD speed increased to 5500. [name] out  - 3/14 Nephrology consulted. Given 1UPRBCs  - 3/15 Started CRRT. Given 1UPRBCs.   He continues on epinephrine 5, milrinone 0.125, amiodarone 30.  Co-ox 67%.   Remains on CRRT, has been pulling 100-200 cc/hr. Weight now seems below pre-op.  CVP remains ?15 but waveform is not good.   On heparin in CRRT circuit only. No bleeding  Weak, standing up to go to chair.   LDH 293>444>597>>>>485 >467 > 541 > 414 > 370   Echo: EF 20-25%, normal RV, PASP 61, dilated IVC.   LVAD Interrogation HM 3: Speed: 2409 Flow:5.5   PI: 1.9   Power: 4.2.  Multiple PI events this morning.   VAD interrogated personally.    Objective:   Weight Range: 82.9 kg Body mass index is 25.85 kg/m.   Vital Signs:   Temp:  [97.9 F (36.6 C)-98.3 F (36.8 C)] 98.3 F (36.8 C) (03/21 0300) Pulse Rate:  [32-129] 89 (03/21 0400) Resp:  [10-24] 16 (03/21 0400) SpO2:  [97 %-100 %] 100 % (03/21 0400) Last BM Date: 10/21/20  Weight change: Filed Weights   10/21/20 0400 10/22/20 0500 10/23/20 0500  Weight: 89.2 kg 87.3 kg 82.9 kg    Intake/Output:   Intake/Output Summary (Last 24 hours) at 10/24/2020 0738 Last data filed at 10/24/2020 0700 Gross per 24 hour  Intake 2878.26 ml  Output 6323 ml  Net -3444.74 ml      Physical Exam   General: Well appearing this am. NAD.  HEENT: Normal. Neck: Supple, JVP 8 cm. Carotids OK.  Cardiac:  Mechanical heart sounds with LVAD hum present.  Lungs:  CTAB, normal effort.  Abdomen:  NT, ND, no HSM. No bruits or  masses. +BS  LVAD exit site: Well-healed and incorporated. Dressing dry and intact. No erythema or drainage. Stabilization device present and accurately applied. Driveline dressing changed daily per sterile technique. Extremities:  Warm and dry. No cyanosis, clubbing, rash, or edema.  Neuro:  Alert & oriented x 3. Cranial nerves grossly intact. Moves all 4 extremities w/o difficulty. Affect pleasant     Telemetry   NSR 90s-100s  Personally reviewed   Labs    CBC Recent Labs    10/23/20 0454 10/24/20 0432  WBC 15.4* 16.2*  HGB 8.5* 8.5*  HCT 27.1* 27.6*  MCV 89.4 90.5  PLT 377 735   Basic Metabolic Panel Recent Labs    10/23/20 0454 10/23/20 1604 10/24/20 0432  NA 129* 129* 132*  K 5.2* 5.2* 4.8  CL 95* 96* 100  CO2 26 25 23   GLUCOSE 265* 151* 184*  BUN 26* 25* 24*  CREATININE 1.85* 1.83* 1.86*  CALCIUM 8.2* 8.5* 7.7*  MG 2.5*  --  2.4  PHOS 2.3* 2.1* 2.1*   Liver Function Tests Recent Labs    10/23/20 1604 10/24/20 0432  ALBUMIN 2.6* 2.4*   No results for input(s): LIPASE, AMYLASE in the last 72 hours. Cardiac Enzymes No results for input(s): CKTOTAL, CKMB, CKMBINDEX, TROPONINI  in the last 72 hours.  BNP: BNP (last 3 results) Recent Labs    10/14/20 0254 10/20/20 0305  BNP 1,097.8* 466.7*    ProBNP (last 3 results) No results for input(s): PROBNP in the last 8760 hours.   D-Dimer No results for input(s): DDIMER in the last 72 hours. Hemoglobin A1C No results for input(s): HGBA1C in the last 72 hours. Fasting Lipid Panel No results for input(s): CHOL, HDL, LDLCALC, TRIG, CHOLHDL, LDLDIRECT in the last 72 hours. Thyroid Function Tests No results for input(s): TSH, T4TOTAL, T3FREE, THYROIDAB in the last 72 hours.  Invalid input(s): FREET3  Other results:   Imaging    No results found.   Medications:     Scheduled Medications: . aspirin EC  325 mg Oral Daily   Or  . aspirin  324 mg Per Tube Daily   Or  . aspirin  300 mg Rectal  Daily  . B-complex with vitamin C  1 tablet Oral Daily  . Chlorhexidine Gluconate Cloth  6 each Topical Daily  . feeding supplement  237 mL Oral TID BM  . gabapentin  100 mg Oral QHS  . insulin aspart  0-15 Units Subcutaneous TID WC  . insulin aspart  0-5 Units Subcutaneous QHS  . insulin detemir  15 Units Subcutaneous BID  . lidocaine  1 patch Transdermal Q24H  . mouth rinse  15 mL Mouth Rinse BID  . metoCLOPramide (REGLAN) injection  5 mg Intravenous Q12H  . midodrine  10 mg Oral TID WC  . pantoprazole  40 mg Oral Daily  . polyethylene glycol  17 g Oral Daily  . senna-docusate  2 tablet Oral BID  . sodium chloride flush  10-40 mL Intracatheter Q12H  . Warfarin - Pharmacist Dosing Inpatient   Does not apply q1600    Infusions: . amiodarone 30 mg/hr (10/24/20 0700)  . epinephrine 5 mcg/min (10/24/20 0700)  . heparin 10,000 units/ 20 mL infusion syringe Stopped (10/23/20 0030)  . lactated ringers    . lactated ringers Stopped (10/17/20 1655)  . milrinone 0.125 mcg/kg/min (10/24/20 0700)  . prismasol BGK 2/2.5 replacement solution 200 mL/hr at 10/24/20 0346  . prismasol BGK 2/2.5 replacement solution 500 mL/hr at 10/24/20 0701  . prismasol BGK 4/2.5 1,500 mL/hr at 10/24/20 0445    PRN Medications: dextrose, heparin, heparin, morphine injection, ondansetron (ZOFRAN) IV, oxyCODONE, sodium chloride flush, traMADol, white petrolatum    Assessment/Plan   1.Acute on chronic systolic HF -> cardiogenic shock:ICM.HasMedtronic ICD.EF 15%. Low output persisted despite milrinone and then dobutamine with rise in creatinine to 2.5. Impella 5.5 placed 3/1.  Creatinine improved to 1.7 and HM3 LVAD was placed on 3/10. Developed post-op renal failure and now on CVVH.  Patient currently on milrinone 0.125 + epi 5, co-ox 67%.  CVP ?15 but not a good waveform. Weight down to pre-op or below.  - Change out CVP and recheck.  - For now, with increased PIs and weight down below pre-op, will back  off on CVVH to -50 cc/hr net today.  - Try to start weaning epinephrine, drop to 4.  - Continue milrinone 0.125.  - Continue midodrine 10 mg tid.  2. LVAD: 3/13 VAD speed increased to 5500.  VAD interrogated personally. Parameters stable.  LDH stable at 370.  - ASA 325 until INR up to 2 then cut aspirin back to 81 mg daily.  INR remains at 1.9 today  - Heparin running in CRT circuit.  D/w TCTS and PharmD - DL  site ok - Warfarin per pharmacy.  3. CAD s/p CABG2002:Last cath in 6/19 with patent grafts.  - Continue Crestor 40 mg daily.  4. AKI on CKD Stage 3b: Suspect that this is a combination of cardiorenal syndrome and diabetic nephropathy and possibly ATN. Developed post-op AKI. Started on CRRT 3/15.  - Nephrology appreciated. D/w Renal. Continue CRRT, as above cutting back to 50 cc/hr net UF. Hopeful for renal recovery.  5. Atrial flutter/fibrillation: s/p DC-CV 08/21/18.Looks like NSR this morning.  - Continue amio drip 30 mg per hour.  6. Wound R Foot: Partial thickness skin loss noted. Cover with mepilex border and change every 5 days.  7. Right arm pain: Think neuropathic, per Dr. Cyndia Bent patient had stretch of brachial plexus with Impella placement.  8. ID:  Afebrile, negative cultures. Follow closely  9. HE:RDEYCXK of very poor control but hgbA1ctrending down. Most recent 7.7%  - Cover with SSI. 10. Anemia: hgb stable at 8.5 11. Deconditioning - PT/OT following  - will need CIR once medically stable   CRITICAL CARE Performed by: Loralie Champagne  Total critical care time: 35 minutes  Critical care time was exclusive of separately billable procedures and treating other patients.  Critical care was necessary to treat or prevent imminent or life-threatening deterioration.  Critical care was time spent personally by me (independent of midlevel providers or residents) on the following activities: development of treatment plan with patient and/or surrogate as well as nursing,  discussions with consultants, evaluation of patient's response to treatment, examination of patient, obtaining history from patient or surrogate, ordering and performing treatments and interventions, ordering and review of laboratory studies, ordering and review of radiographic studies, pulse oximetry and re-evaluation of patient's condition.    Length of Stay: [place], MD  10/24/2020, 7:38 AM  Advanced Heart Failure Team Pager [phone] (M-F; 7a - 4p)  Please contact [facility] for night-coverage after hours (4p -7a ) and weekends on [email or web address]

## 2020-10-24 NOTE — Progress Notes (Signed)
OT Cancellation Note  Patient Details Name: Marcus Johnson. MRN: [id number] DOB: 08/06/1961   Cancelled Treatment:    Reason Eval/Treat Not Completed: Patient at procedure or test/ unavailable (having PICC dressing changed.)  [place], OT/L   Acute OT Clinical Specialist Acute Rehabilitation Services Pager [phone] Office [phone]  10/24/2020, 2:32 PM

## 2020-10-24 NOTE — Progress Notes (Signed)
Dr. Aundra Dubin reduced epinephrine to 4 and gave verbal instruction to RN to change CRRT fluid pull from -100/-200 to -50.

## 2020-10-24 NOTE — Progress Notes (Addendum)
Occupational Therapy Treatment Patient Details Name: Marcus Johnson. MRN: [id number] DOB: 11/22/1960 Today's Date: 10/24/2020    History of present illness 60 y.o. male admitted 2/28 with ICM for LVAD workup. Impella placed 3/1. Heartmate 3 LVAD placed 3/10. CRRT started 3/15. PMHx: DM2, CAD s/p CABG 0998, systolic HF due to ischemic cardiomyopathy with EF 20-25% (echo 12/15), DM2 and CKD. He is s/p Medtronic single chamber ICD. Covid 1/22   OT comments  Focus of session on further assessment of RUE. Pt with apparent neurapraxia RUE with greater weakness distally. Greater difficulty in the median/ulnar n distribution. Able to make gross grasp/release with the exception of index finger. Unable to oppose thumb to digits and difficulty with ab/adduction of digits. Completed therapeutic exercise/activites as indicated below. Will return to trial betapile strap to improve opposition to increase functional use of hand.   Follow Up Recommendations  CIR;Supervision/Assistance - 24 hour    Equipment Recommendations  Other (comment) (TBA)    Recommendations for Other Services Rehab consult    Precautions / Restrictions Precautions Precautions: Fall;Sternal Precaution Comments: LVAD, CRRT, dizziness/presyncope in standing       Mobility Bed Mobility                    Transfers                 General transfer comment: NA; event with PT earlier    Balance                                           ADL either performed or assessed with clinical judgement   ADL                                         General ADL Comments: Encouraged pt to work on self feeding with built up tubing     Manufacturing systems engineer      Cognition Arousal/Alertness: Awake/alert Behavior During Therapy: Flat affect Overall Cognitive Status: Impaired/Different from baseline Area of Impairment: Memory                      Memory: Decreased short-term memory Following Commands: Follows one step commands consistently     Problem Solving: Slow processing General Comments: more interactive as session progressed        Exercises Exercises: General Upper Extremity General Exercises - Upper Extremity Shoulder Flexion: Right;5 reps;Seated (to 90) Shoulder ABduction: AAROM;Right;5 reps;Seated (to 60) Elbow Flexion: AROM;Right;5 reps Elbow Extension: AROM;Right;5 reps;Seated Wrist Flexion: AROM;Right;5 reps;Seated Wrist Extension: AROM;Right;5 reps;Seated Digit Composite Flexion: AROM;Right;15 reps;Seated;Squeeze ball Composite Extension: AROM;Right;10 reps;Seated   Shoulder Instructions       General Comments RUE with apparent neurapraxia - greater weakness distally with impaired sensation, greater in median n distribution. Able to extend all digits; gross grasp/release; index finger unable to achieve composite flexion - PROM WFL; sensation impaired, appears more imparied in medican n distribution; difficulty with ad/abuction; unable to oppose thumb to digits; shoulder flexion/abduction limited to 90 AROM with min A 3/5; supination/pronation  @ 3/5; elbow flexion 3+/5; 3/5 elbow extension  Completed place and hold ex Focus on over-exaggerating movement and using vision to improve movement patterns  Pertinent Vitals/ Pain       Faces Pain Scale: Hurts little more Pain Location: chest/abdomen Pain Descriptors / Indicators: Discomfort;Grimacing Pain Intervention(s): Limited activity within patient's tolerance  Home Living                                          Prior Functioning/Environment              Frequency  Min 2X/week        Progress Toward Goals  OT Goals(current goals can now be found in the care plan section)  Progress towards OT goals: Progressing toward goals  Acute Rehab OT Goals Patient Stated Goal: to go home and be with his grandchildren OT Goal  Formulation: With patient Time For Goal Achievement: 10/31/20 Potential to Achieve Goals: Good ADL Goals Pt Will Perform Eating: with set-up;with adaptive utensils Pt Will Perform Grooming: with set-up;sitting;with adaptive equipment Pt Will Transfer to Toilet: with min guard assist;ambulating Pt/caregiver will Perform Home Exercise Program: Increased ROM;Increased strength;Both right and left upper extremity;With minimal assist;With written HEP provided Additional ADL Goal #1: Pt will be able to recall sternal precautions Additional ADL Goal #2: Pt will be able to change out his lines from battery<>wall with min A  Plan Discharge plan remains appropriate    Co-evaluation                 AM-PAC OT "6 Clicks" Daily Activity     Outcome Measure   Help from another person eating meals?: A Little Help from another person taking care of personal grooming?: A Lot Help from another person toileting, which includes using toliet, bedpan, or urinal?: Total Help from another person bathing (including washing, rinsing, drying)?: Total Help from another person to put on and taking off regular upper body clothing?: Total Help from another person to put on and taking off regular lower body clothing?: Total 6 Click Score: 9    End of Session    OT Visit Diagnosis: Unsteadiness on feet (R26.81);Other abnormalities of gait and mobility (R26.89);Muscle weakness (generalized) (M62.81);Other symptoms and signs involving cognitive function;Dizziness and giddiness (R42) Pain - Right/Left: Right Pain - part of body: Arm   Activity Tolerance Patient tolerated treatment well   Patient Left in bed;with call bell/phone within reach;with nursing/sitter in room   Nurse Communication Other (comment) (RUE activities)        Time: 9379-0240 OT Time Calculation (min): 20 min  Charges: OT General Charges $OT Visit: 1 Visit  1 neuro re-ed  Maurie Boettcher, OT/L   Acute OT Clinical Specialist [place] Pager [phone] Office [phone]    [facility] 10/24/2020, 4:00 PM

## 2020-10-24 NOTE — Progress Notes (Addendum)
LVAD Coordinator Rounding Note:  Admitted 09/28/20 due to worsening heart failure symptoms. Recent COVID infection January 2022; did not take medications for 3-4 weeks.   S/p dental extractions 10/07/20 per Dr Benson Norway.  HM III LVAD implanted on 10/13/20 by Dr Cyndia Bent under Destination Therapy criteria.  Pt sitting up in chair, nurse reports he just got up. Pt c/o being tired and not feeling well today. Dr. Aundra Dubin decreased dialysis pull to 50 cc/hr. Nurse reports no dialysis for last hour due to issues with dialysis catheter; she is currently working on problem.   Vital signs: Temp: 98.3 HR: 99 Doppler:  84 Auto cuff: not documented O2 Sat: 99% on RA Wt: 200.1>221.7>219.5>212.7>223>224.6>217.5>212>196.6>192.4>182.7 lbs     LVAD interrogation reveals:  Speed: 5500 Flow: 5.4 Power: 4.2w PI: 1.8 Hematocrit: 28  Alarms: none Events: 20 PI events on 10/23/20  Fixed speed: 5500 Low speed limit: 5200  Drive Line: Existing VAD dressing removed and site care performed using sterile technique. Drive line exit site cleaned with Chlora prep applicators x 2, allowed to dry, and gauze dressing with silver strip re-applied. Exit site healing and unincorporated the velour is fully implanted at exit site. There is 1 suture visible that is wrapped around the driveline. Decreased amount of serousanguineous drainage on gauze dressing. No redness, tenderness, foul odor or rash noted. Few inches of drive line from exit site to modular cable connection, making positioning/anchoring of drive line difficult. Drive line anchor correctly applied. Continue daily dressing changes by VAD coordinator or nurse champion. Next dressing change is due 10/25/20.  Labs:  LDH trend: 444>670>575>571>485>467>541>414>370  INR trend: 1.4>1.7>1.8>1.7>1.6>1.8>1.9  Creatinine: 1.84>3.0>3.4>2.73>2.04>1.70>1.85>1.83>1.86  Anticoagulation Plan: -INR Goal: 2.0-2.5 -ASA Dose: 325 mg until INR therapeutic   Blood Products:   IntraOp:   1 Platelet  6 FFP  3 PRBC  1035 cell saver Postop:   10/13/20:   4 FFP  4 PRBC    10/14/20:  1 FFP  1 PRBC   10/16/20:  1 PRBC   10/18/20:  1 PRBC   10/16/20:  1 PRBC   10/18/20:  1 PRBC  Device: - Medtronic single -Therapies: off   Arrythmias: VT in OR requiring defibrillation   Renal: CRRT started 10/19/20  Drips: Epinephrine 4 mcg/min Milrinone 0.125 mcg/kg/min Amiodarone 30 mg/hr  Patient Education: 1. Patient in pain this morning up in the chair - no family at bedside. Patient weak, not feeling well. Education inappropriate at this time.   Plan/Recommendations:  1. Call VAD coordinator for equipment or drive line issues 2. Daily drive line dressing change per VAD coordinator or nurse champion. Next dressing change due 10/25/20  Zada Girt RN [place] Coordinator  Office: [phone]  24/7 Pager: [phone]

## 2020-10-24 NOTE — Progress Notes (Signed)
[facility] NEPHROLOGY PROGRESS NOTE  Assessment/ Plan:  Acute kidney injury on chronic kidney disease stage IIIa: With underlying chronic kidney disease likely from diabetes/hypertension and worsening renal function from hemodynamically mediated ATN versus cardiorenal syndrome.   -following CVP's, continue with CRRT.No changes with bags. Agree with UF being decreased to 50cc/hr. -Heparin thru circuit  Acute exacerbation of chronic systolic heart failure:With evidence of cardiogenic shock status post LVAD. Remains on pressors with epinephrine, milrinone.  Volume status/UF managed with CRRT.   Anemia:Likely secondary to chronic disease and exacerbated by recent surgical losses.  Transfuse PRBC as needed.  Atrial fibrillation: Intermittently tachycardic, continue amiodarone and on Coumadin   Hypophosphatemia: Repleted IV phosphate, requiring daily.  Follow-up lab result.  Subjective: Seen and examined in ICU.  Tolerating CRRT, tolerated UF around 100-200cc/hr. Remains on epi and milrinone. Patient does report being weak otherwise no other complaints. Objective Vital signs in last 24 hours: Vitals:   10/24/20 0800 10/24/20 0900 10/24/20 1000 10/24/20 1100  BP:      Pulse: (!) 122   60  Resp: 14 17 15  (!) 23  Temp:      TempSrc:      SpO2: 99%   100%  Weight:      Height:       Weight change:   Intake/Output Summary (Last 24 hours) at 10/24/2020 1119 Last data filed at 10/24/2020 1100 Gross per 24 hour  Intake 2884.76 ml  Output 5601 ml  Net -2716.24 ml       Labs: Basic Metabolic Panel: Recent Labs  Lab 10/23/20 0454 10/23/20 1604 10/24/20 0432  NA 129* 129* 132*  K 5.2* 5.2* 4.8  CL 95* 96* 100  CO2 26 25 23   GLUCOSE 265* 151* 184*  BUN 26* 25* 24*  CREATININE 1.85* 1.83* 1.86*  CALCIUM 8.2* 8.5* 7.7*  PHOS 2.3* 2.1* 2.1*   Liver Function Tests: Recent Labs  Lab 10/23/20 0454 10/23/20 1604 10/24/20 0432  ALBUMIN 2.4* 2.6* 2.4*   No  results for input(s): LIPASE, AMYLASE in the last 168 hours. No results for input(s): AMMONIA in the last 168 hours. CBC: Recent Labs  Lab 10/18/20 0409 10/18/20 1538 10/19/20 0304 10/19/20 0324 10/20/20 0310 10/21/20 0930 10/22/20 0310 10/22/20 0439 10/23/20 0454 10/24/20 0432  WBC 17.9*   < > 17.7*  --  13.8* 15.9* 14.8*  --  15.4* 16.2*  NEUTROABS 14.7*  --  14.8*  --  11.3*  --   --   --   --   --   HGB 7.0*   < > 8.5*   < > 8.1* 8.4* 8.3* 9.5* 8.5* 8.5*  HCT 22.3*   < > 26.8*   < > 25.3* 26.6* 27.2* 28.0* 27.1* 27.6*  MCV 91.0   < > 88.7  --  89.4 89.6 90.4  --  89.4 90.5  PLT 246   < > 262  --  255 323 327  --  377 393   < > = values in this interval not displayed.   Cardiac Enzymes: No results for input(s): CKTOTAL, CKMB, CKMBINDEX, TROPONINI in the last 168 hours. CBG: Recent Labs  Lab 10/23/20 1105 10/23/20 1553 10/23/20 2017 10/24/20 0544 10/24/20 1116  GLUCAP 259* 159* 104* 137* 284*    Iron Studies: No results for input(s): IRON, TIBC, TRANSFERRIN, FERRITIN in the last 72 hours. Studies/Results: DG Chest Port 1 View  Result Date: 10/23/2020 CLINICAL DATA:  History of open heart surgery. Evaluate tube and catheter  position, pneumothorax, pleural effusion, atelectasis. EXAM: PORTABLE CHEST 1 VIEW COMPARISON:  Prior chest radiograph 10/20/2020 and earlier. FINDINGS: Left chest single lead AICD device. Left ventricular cyst device. Abandoned epicardial pacing wires. An enteric tube passes below the level of left hemidiaphragm with tip excluded from the field of view. Unchanged position of a right IJ approach central venous catheter with tip projecting in the region of the superior cavoatrial junction. Prior median sternotomy/CABG. Unchanged cardiomegaly. Central pulmonary vascular congestion. Persistent small left pleural effusion. Superimposed hazy opacity within the left mid to lower lung field, slightly increased from the prior examination, and favored to reflect  subsegmental atelectasis. The right lung remains clear. No right pleural effusion. No evidence of pneumothorax. IMPRESSION: Support apparatus as described. Persistent small left pleural effusion. Superimposed hazy opacity within the left mid to lower lung field, slightly increased from the prior exam, and favored to reflect subsegmental atelectasis. Unchanged cardiomegaly and central pulmonary vascular congestion. Electronically Signed   By: Kellie Simmering DO   On: 10/23/2020 08:56    Medications: Infusions: . amiodarone 30 mg/hr (10/24/20 1100)  . epinephrine 4 mcg/min (10/24/20 1100)  . heparin 10,000 units/ 20 mL infusion syringe Stopped (10/23/20 0030)  . lactated ringers    . lactated ringers Stopped (10/17/20 1655)  . milrinone 0.125 mcg/kg/min (10/24/20 1100)  . prismasol BGK 2/2.5 replacement solution 200 mL/hr at 10/24/20 0346  . prismasol BGK 2/2.5 replacement solution 500 mL/hr at 10/24/20 0701  . prismasol BGK 4/2.5 1,500 mL/hr at 10/24/20 0445    Scheduled Medications: . aspirin EC  325 mg Oral Daily   Or  . aspirin  324 mg Per Tube Daily   Or  . aspirin  300 mg Rectal Daily  . B-complex with vitamin C  1 tablet Oral Daily  . Chlorhexidine Gluconate Cloth  6 each Topical Daily  . feeding supplement  237 mL Oral TID BM  . gabapentin  100 mg Oral QHS  . insulin aspart  0-15 Units Subcutaneous TID WC  . insulin aspart  0-5 Units Subcutaneous QHS  . insulin detemir  15 Units Subcutaneous BID  . lidocaine  1 patch Transdermal Q24H  . mouth rinse  15 mL Mouth Rinse BID  . metoCLOPramide (REGLAN) injection  5 mg Intravenous Q12H  . midodrine  10 mg Oral TID WC  . pantoprazole  40 mg Oral Daily  . polyethylene glycol  17 g Oral Daily  . senna-docusate  2 tablet Oral BID  . sodium chloride flush  10-40 mL Intracatheter Q12H  . warfarin  2.5 mg Oral ONCE-1600  . Warfarin - Pharmacist Dosing Inpatient   Does not apply q1600    have reviewed scheduled and prn  medications.  Physical Exam: General:NAD, looking tired, not in distress, sitting up in bed Heart: Mechanical heart sound/lvad hum Lungs: Coarse breath sound bilateral Abdomen:soft, Non-tender, non-distended Extremities:LE edema+ Dialysis Access: Temporary HD catheter.  Vikas Singh 10/24/2020,11:19 AM  LOS: 26 days

## 2020-10-24 NOTE — Progress Notes (Signed)
Two unsuccessful attempts   to get patient out of bed  to stand on scale. After patients stands up his eyes roll and loses focus. Momentarily patient becomes confused and shakes. No vad alarm but numerous PI events associated with this activity. Symptoms resolve when patient was placed back in the bed

## 2020-10-24 NOTE — Progress Notes (Signed)
Nutrition Follow Up  DOCUMENTATION CODES:   Not applicable  INTERVENTION:   Continue liberalized diet of REGULAR   Continue Ensure Enlive po TID, each supplement provides 350 kcal and 20 grams of protein  Continue B complex with Vitamin C to account for losses with CRRT  NUTRITION DIAGNOSIS:   Increased nutrient needs related to chronic illness (CHF) as evidenced by estimated needs.  Ongoing  GOAL:   Patient will meet greater than or equal to 90% of their needs   Progressing   MONITOR:   PO intake,Supplement acceptance,Labs,Weight trends,Skin,I & O's  REASON FOR ASSESSMENT:   Consult LVAD Eval  ASSESSMENT:   Marcus Johnson. is a 60 y.o. male who has a h/o DM2, CAD s/p CABG 0981, systolic HF due to ischemic cardiomyopathy with EF 20-25% (echo 12/15), DM2 and CKD. He is s/p Medtronic single chamber ICD.Pt admitted with CHF.  3/01- s/p impella  3/04- s/p multiple tooth extraction  3/10- s/p impella removal, LVAD implantation  3/11- extubated  3/14- Cortrak placed, continuous feeds started 3/15- CRRT started  Pt discussed during ICU rounds and with RN.   Remains on CRRT- pulling 50 ml/hr. Attempting wean from epinephrine. TF held on Friday to monitor PO intake. Meal completions charted as 90-100% for each meal over the weekend. Cortrak removed and RD reiterated importance of kcal and protein intake given wounds and CRRT.   Recommend leaving patient on regular diet to provide more option and Ensure as it provides the most kcal and protein. Pharmacy aware and willing to make insulin adjustments as needed.   Admission weight: 97.5 kg  Current weight: 82.9 kg (down from 101.9 on 3/15)  UOP: anuric  CRRT: 6323 ml x 24 hrs   Drips: epinephrine Medications: SS novolog, levemir, 5 mg reglan BID, miralax, senokot Labs: Na 132 (L) Phosphorus 2.1 (L) CBG 104-284  Diet Order:   Diet Order            Diet regular Room service appropriate? Yes; Fluid  consistency: Thin  Diet effective now                 EDUCATION NEEDS:   Education needs have been addressed  Skin:  Skin Assessment: Skin Integrity Issues: Skin Integrity Issues:: Incisions Incisions: chest x3 Stage II: coccyx x3 Other: skin tear R chest, skin tear L arm,   Last BM:  3/18  Height:   Ht Readings from Last 1 Encounters:  10/21/20 5' 10.5" (1.791 m)    Weight:   Wt Readings from Last 1 Encounters:  10/23/20 82.9 kg    Ideal Body Weight:  76.8 kg  BMI:  Body mass index is 25.85 kg/m.  Estimated Nutritional Needs:   Kcal:  2250-2450 kcal  Protein:  115-130 grams  Fluid:  2 L  Mariana Single RD, LDN Clinical Nutrition Pager listed in [place]

## 2020-10-24 NOTE — Progress Notes (Signed)
Patient ID: Marcus Johnson., male   DOB: 12/23/1960, 60 y.o.   MRN: [id number] HeartMate 2 Rounding Note  Subjective:    Hemodynamically stable over the weekend on epi 5, milrinone 0.125, amio 30.   Co-ox 67% this am. CVP 17-18 after changed of setup. ? Accuracy.  Remains on CRRT. -3445 cc yesterday. Wt is down to 177 which is 23 lbs below preop.  Standing up to go to chair but got weak and shaky today. MAP remained stable.  LVAD INTERROGATION:  HeartMate II LVAD:  Flow 5.5 liters/min, speed 5500, power 4.2, PI 2.3.  Multiple PI events this am.  Objective:    Vital Signs:   Temp:  [97.7 F (36.5 C)-98.3 F (36.8 C)] 97.7 F (36.5 C) (03/21 1613) Pulse Rate:  [56-122] 92 (03/21 1700) Resp:  [14-24] 17 (03/21 1700) BP: (124)/(108) 124/108 (03/21 1700) SpO2:  [95 %-100 %] 98 % (03/21 1700) Weight:  [80.3 kg] 80.3 kg (03/21 1000) Last BM Date: 10/21/20 Mean arterial Pressure 115 this evening  Intake/Output:   Intake/Output Summary (Last 24 hours) at 10/24/2020 1718 Last data filed at 10/24/2020 1700 Gross per 24 hour  Intake 2933.45 ml  Output 5404 ml  Net -2470.55 ml     Physical Exam: General:  Looks fatigued, No resp difficulty HEENT: normal Neck: normal Cor: Distant heart sounds with LVAD hum present. Lungs: clear Chest incisions healing well. Drive line site looked good per [employer].  Abdomen: soft, nontender, nondistended. Good bowel sounds. Extremities: no edema Neuro: alert & orientedx3, cranial nerves grossly intact. moves all 4 extremities w/o difficulty. Affect pleasant  Telemetry: sinus 90's  Labs: Basic Metabolic Panel: Recent Labs  Lab 10/20/20 0310 10/20/20 0311 10/21/20 0416 10/21/20 1631 10/22/20 0310 10/22/20 0439 10/22/20 1628 10/23/20 0454 10/23/20 1604 10/24/20 0432 10/24/20 1556  NA  --    < > 133*   < > 132*   < > 130* 129* 129* 132* 130*  K  --    < > 4.6   < > 4.5   < > 5.5* 5.2* 5.2* 4.8 4.7  CL  --    < > 98   < > 97*   --  94* 95* 96* 100 96*  CO2  --    < > 25   < > 25  --  25 26 25 23 24   GLUCOSE  --    < > 235*   < > 82  --  358* 265* 151* 184* 243*  BUN  --    < > 26*   < > 26*  --  24* 26* 25* 24* 26*  CREATININE  --    < > 1.70*   < > 1.69*  --  1.75* 1.85* 1.83* 1.86* 1.88*  CALCIUM  --    < > 8.3*   < > 8.3*  --  7.8* 8.2* 8.5* 7.7* 8.2*  MG 2.3  --  2.6*  --  2.6*  --   --  2.5*  --  2.4  --   PHOS  --    < > 1.7*   < > 1.9*  --  3.7 2.3* 2.1* 2.1* 2.2*   < > = values in this interval not displayed.    Liver Function Tests: Recent Labs  Lab 10/22/20 1628 10/23/20 0454 10/23/20 1604 10/24/20 0432 10/24/20 1556  ALBUMIN 2.3* 2.4* 2.6* 2.4* 2.4*   No results for input(s): LIPASE, AMYLASE in the last 168 hours. No results  for input(s): AMMONIA in the last 168 hours.  CBC: Recent Labs  Lab 10/18/20 0409 10/18/20 1538 10/19/20 0304 10/19/20 0324 10/20/20 0310 10/21/20 0930 10/22/20 0310 10/22/20 0439 10/23/20 0454 10/24/20 0432  WBC 17.9*   < > 17.7*  --  13.8* 15.9* 14.8*  --  15.4* 16.2*  NEUTROABS 14.7*  --  14.8*  --  11.3*  --   --   --   --   --   HGB 7.0*   < > 8.5*   < > 8.1* 8.4* 8.3* 9.5* 8.5* 8.5*  HCT 22.3*   < > 26.8*   < > 25.3* 26.6* 27.2* 28.0* 27.1* 27.6*  MCV 91.0   < > 88.7  --  89.4 89.6 90.4  --  89.4 90.5  PLT 246   < > 262  --  255 323 327  --  377 393   < > = values in this interval not displayed.    INR: Recent Labs  Lab 10/20/20 0310 10/21/20 0416 10/22/20 0310 10/23/20 0454 10/24/20 0432  INR 1.7* 1.6* 1.6* 1.8* 1.9*    Other results:  EKG:   Imaging: DG Chest Port 1 View  Result Date: 10/23/2020 CLINICAL DATA:  History of open heart surgery. Evaluate tube and catheter position, pneumothorax, pleural effusion, atelectasis. EXAM: PORTABLE CHEST 1 VIEW COMPARISON:  Prior chest radiograph 10/20/2020 and earlier. FINDINGS: Left chest single lead AICD device. Left ventricular cyst device. Abandoned epicardial pacing wires. An enteric tube  passes below the level of left hemidiaphragm with tip excluded from the field of view. Unchanged position of a right IJ approach central venous catheter with tip projecting in the region of the superior cavoatrial junction. Prior median sternotomy/CABG. Unchanged cardiomegaly. Central pulmonary vascular congestion. Persistent small left pleural effusion. Superimposed hazy opacity within the left mid to lower lung field, slightly increased from the prior examination, and favored to reflect subsegmental atelectasis. The right lung remains clear. No right pleural effusion. No evidence of pneumothorax. IMPRESSION: Support apparatus as described. Persistent small left pleural effusion. Superimposed hazy opacity within the left mid to lower lung field, slightly increased from the prior exam, and favored to reflect subsegmental atelectasis. Unchanged cardiomegaly and central pulmonary vascular congestion. Electronically Signed   By: Kellie Simmering DO   On: 10/23/2020 08:56      Medications:     Scheduled Medications: . aspirin EC  325 mg Oral Daily   Or  . aspirin  324 mg Per Tube Daily   Or  . aspirin  300 mg Rectal Daily  . B-complex with vitamin C  1 tablet Oral Daily  . Chlorhexidine Gluconate Cloth  6 each Topical Daily  . feeding supplement  237 mL Oral TID BM  . gabapentin  100 mg Oral QHS  . insulin aspart  0-15 Units Subcutaneous TID WC  . insulin aspart  0-5 Units Subcutaneous QHS  . insulin detemir  15 Units Subcutaneous BID  . lidocaine  1 patch Transdermal Q24H  . mouth rinse  15 mL Mouth Rinse BID  . metoCLOPramide (REGLAN) injection  5 mg Intravenous Q12H  . midodrine  10 mg Oral TID WC  . pantoprazole  40 mg Oral Daily  . polyethylene glycol  17 g Oral Daily  . senna-docusate  2 tablet Oral BID  . sodium chloride flush  10-40 mL Intracatheter Q12H  . Warfarin - Pharmacist Dosing Inpatient   Does not apply q1600     Infusions: . amiodarone 30  mg/hr (10/24/20 1700)  .  epinephrine 4 mcg/min (10/24/20 1700)  . heparin 10,000 units/ 20 mL infusion syringe Stopped (10/23/20 0030)  . lactated ringers    . lactated ringers Stopped (10/17/20 1655)  . milrinone 0.125 mcg/kg/min (10/24/20 1700)  . prismasol BGK 2/2.5 replacement solution 200 mL/hr at 10/24/20 0346  . prismasol BGK 2/2.5 replacement solution 500 mL/hr at 10/24/20 0701  . prismasol BGK 4/2.5 1,500 mL/hr at 10/24/20 1708     PRN Medications:  dextrose, heparin, heparin, morphine injection, ondansetron (ZOFRAN) IV, oxyCODONE, sodium chloride flush, traMADol, white petrolatum   Assessment:   1. Acute on chronic systolic congestive heart failure due to ischemic cardiomyopathy with ejection fraction of 20 to 25% admitted with class IIIb symptoms. [id number] LVAD.  2. CAD s/p CABG 2002 with patent grafts on last cath.  3. Stage 3 CKD due to diabetic nephropathy and cardiorenal syndrome. Baseline recently 1.8.CRRT removing volume. He is now down to 177, 23 lbs below preop. 4. Atrial fib/flutter:Sinus alternating with AFon amio IV.  5. Right brachial plexus stretch from preop Impella and hematoma around the axillary artery. This should improve with time.He has pain in shoulder and numbness in hand. 6. Expected acute postop blood loss anemia.Hgb stable. 7. Blisters on buttocks probably due to edema and pressure. Turn frequently and observe.  8. DM: glucose underfaircontrol on Levemir and SSI.  Plan/Discussion:    He has been hemodynamically stable with rising MAP. Should be able to wean epi some. He is also on midodrine. Recorded CVP seems much higher than I would expect with Wt 23 lbs below preop, no edema and fairly good RV function. Not sure if that is completely accurate through the PICC line. May be worth repeating his echo to check RV. I think he may be getting too dry now. May be time to back off on CRRT and see if his kidneys pick up.  INR 1.9. Coumadin per pharmacy.  Continue PT, OT,  nutrition.   Length of Stay: [address]  Fernande Boyden [facility] 10/24/2020, 5:18 PM

## 2020-10-24 NOTE — Progress Notes (Signed)
ANTICOAGULATION CONSULT NOTE - Follow Up Consult  Pharmacy Consult for Warfarin Indication: LVAD  Allergies  Allergen Reactions  . Meclizine Hcl Anaphylaxis and Swelling  . Ivabradine Nausea Only    Patient Measurements: Height: 5' 10.5" (179.1 cm) Weight: 82.9 kg (182 lb 12.2 oz) IBW/kg (Calculated) : 74.15 Heparin Dosing Weight: 89.2 kg  Vital Signs: Temp: 98.3 F (36.8 C) (03/21 0300) Temp Source: Oral (03/21 0300) Pulse Rate: 89 (03/21 0400)  Labs: Recent Labs    10/21/20 1818 10/22/20 0310 10/22/20 0439 10/22/20 1628 10/23/20 0454 10/23/20 1604 10/24/20 0432  HGB  --  8.3* 9.5*  --  8.5*  --  8.5*  HCT  --  27.2* 28.0*  --  27.1*  --  27.6*  PLT  --  327  --   --  377  --  393  APTT  --  44*  --   --   --   --   --   LABPROT  --  18.6*  --   --  20.6*  --  21.5*  INR  --  1.6*  --   --  1.8*  --  1.9*  HEPARINUNFRC <0.10* <0.10*  --   --   --   --   --   CREATININE  --  1.69*  --    < > 1.85* 1.83* 1.86*   < > = values in this interval not displayed.    Estimated Creatinine Clearance: 44.9 mL/min (A) (by C-G formula based on SCr of 1.86 mg/dL (H)).   Medical History: Past Medical History:  Diagnosis Date  . AICD (automatic cardioverter/defibrillator) present    Single-chamber  implantable cardiac defibrillator - Medtronic  . Atrial fibrillation ([place])   . Cataract    Mixed form OU  . CHF (congestive heart failure) ([place])   . Chronic kidney disease   . Chronic kidney disease (CKD), stage III (moderate) (HCC)   . Chronic systolic heart failure ([place])    a. Echo 5/13: Mild LVE, mild LVH, EF 10%, anteroseptal, lateral, apical AK, mild MR, mild LAE, moderately reduced RVSF, mild RAE, PASP 60;  b. 07/2014 Echo: EF 20-2%, diff HK, AKI of antsep/apical/mid-apicalinferior, mod reduced RV.  Marland Kitchen Coronary artery disease    a. s/p CABG 2002 b. LHC 5/13:  dLM 80%, LAD subtotally occluded, pCFX occluded, pRCA 50%, mid? Occlusion with high take off of the PDA with 70%  multiple lesions-not bypassed and supplies collaterals to LAD, LIMA-IM/ramus ok, S-OM ok, S-PLA branches ok. Medical therapy was recommended  . COVID   . Diabetic retinopathy (HCC)    NPDR OD, PDR OS  . Dyspnea   . Gout    "on daily RX" (01/08/2018)  . Hypertension   . Hypertensive retinopathy    OU  . Ischemic cardiomyopathy    a. Echo 5/13: Mild LVE, mild LVH, EF 10%, anteroseptal, lateral, apical AK, mild MR, mild LAE, moderately reduced RVSF, mild RAE, PASP 60;  b. 01/2012 s/p MDT [id number] Protecta XT VR AICD;  c. 07/2014 Echo: EF 20-2%, diff HK, AKI of antsep/apical/mid-apicalinferior, mod reduced RV.  Marland Kitchen MRSA (methicillin resistant Staphylococcus aureus)    Status post right foot plantar deep infection with MRSA status post  I&D 10/2008  . Myocardial infarction [facility])    "was told I'd had several before heart OR in 2002" (01/08/2018)  . Noncompliance   . Peripheral neuropathy   . Retinopathy, diabetic, background ([place])   . Syncope   . Type II diabetes  mellitus ([place])    requiring insulin   . Vitreous hemorrhage, left (HCC)    and proliferative diabetic retinopathy    Assessment: 60 yo M s/p new LVAD implant with HeartMate III on 3/10. Warfarin started per MD on 3/12, and pharmacy asked to take over warfarin dosing.   INR remains slightly subtherapeutic but up to 1.9 today after decreasing dose slightly yesterday. Also adjustingTF and Ensure so varying amounts vitamin K. Cautious warfarin dosing CBC stable. No overt bleeding. LDH down 370 today.  Goal of Therapy:  INR 2-2.5 Monitor platelets by anticoagulation protocol: Yes   Plan:  Warfarin 2.5 mg PO tonight  Daily PT/INR, CBC, monitor s/sx bleeding Monitor aPTT/HL while receiving heparin through CRRT to ensure not supratherapeutic  [name], PharmD, BCPS PGY2 Cardiology Pharmacy Resident Phone: [phone] 10/24/2020  7:36 AM  Please check [email or web address] for unit-specific pharmacy phone numbers.

## 2020-10-25 DIAGNOSIS — Z95811 Presence of heart assist device: Secondary | ICD-10-CM | POA: Diagnosis not present

## 2020-10-25 DIAGNOSIS — I5023 Acute on chronic systolic (congestive) heart failure: Secondary | ICD-10-CM | POA: Diagnosis not present

## 2020-10-25 LAB — GLUCOSE, RANDOM: Glucose, Bld: 884 mg/dL (ref 70–99)

## 2020-10-25 LAB — BASIC METABOLIC PANEL
Anion gap: 10 (ref 5–15)
BUN: 25 mg/dL — ABNORMAL HIGH (ref 6–20)
CO2: 25 mmol/L (ref 22–32)
Calcium: 8.6 mg/dL — ABNORMAL LOW (ref 8.9–10.3)
Chloride: 98 mmol/L (ref 98–111)
Creatinine, Ser: 2.01 mg/dL — ABNORMAL HIGH (ref 0.61–1.24)
GFR, Estimated: 38 mL/min — ABNORMAL LOW (ref >60)
Glucose, Bld: 160 mg/dL — ABNORMAL HIGH (ref 70–99)
Potassium: 4.7 mmol/L (ref 3.5–5.1)
Sodium: 133 mmol/L — ABNORMAL LOW (ref 135–145)

## 2020-10-25 LAB — CBC
HCT: 26.2 % — ABNORMAL LOW (ref 39.0–52.0)
Hemoglobin: 8.3 g/dL — ABNORMAL LOW (ref 13.0–17.0)
MCH: 28.4 pg (ref 26.0–34.0)
MCHC: 31.7 g/dL (ref 30.0–36.0)
MCV: 89.7 fL (ref 80.0–100.0)
Platelets: 421 10*3/uL — ABNORMAL HIGH (ref 150–400)
RBC: 2.92 MIL/uL — ABNORMAL LOW (ref 4.22–5.81)
RDW: 18.9 % — ABNORMAL HIGH (ref 11.5–15.5)
WBC: 15.7 10*3/uL — ABNORMAL HIGH (ref 4.0–10.5)
nRBC: 0.4 % — ABNORMAL HIGH (ref 0.0–0.2)

## 2020-10-25 LAB — RENAL FUNCTION PANEL
Albumin: 2.3 g/dL — ABNORMAL LOW (ref 3.5–5.0)
Albumin: 2 g/dL — ABNORMAL LOW (ref 3.5–5.0)
Anion gap: 9 (ref 5–15)
Anion gap: 11 (ref 5–15)
BUN: 25 mg/dL — ABNORMAL HIGH (ref 6–20)
BUN: 21 mg/dL — ABNORMAL HIGH (ref 6–20)
CO2: 24 mmol/L (ref 22–32)
CO2: 20 mmol/L — ABNORMAL LOW (ref 22–32)
Calcium: 8.3 mg/dL — ABNORMAL LOW (ref 8.9–10.3)
Calcium: 6.9 mg/dL — ABNORMAL LOW (ref 8.9–10.3)
Chloride: 99 mmol/L (ref 98–111)
Chloride: 83 mmol/L — ABNORMAL LOW (ref 98–111)
Creatinine, Ser: 1.76 mg/dL — ABNORMAL HIGH (ref 0.61–1.24)
Creatinine, Ser: 1.77 mg/dL — ABNORMAL HIGH (ref 0.61–1.24)
GFR, Estimated: 44 mL/min — ABNORMAL LOW (ref >60)
GFR, Estimated: 44 mL/min — ABNORMAL LOW (ref >60)
Glucose, Bld: 241 mg/dL — ABNORMAL HIGH (ref 70–99)
Glucose, Bld: 860 mg/dL (ref 70–99)
Phosphorus: 1.9 mg/dL — ABNORMAL LOW (ref 2.5–4.6)
Phosphorus: >30 mg/dL — ABNORMAL HIGH (ref 2.5–4.6)
Potassium: 4.3 mmol/L (ref 3.5–5.1)
Potassium: >7.5 mmol/L (ref 3.5–5.1)
Sodium: 132 mmol/L — ABNORMAL LOW (ref 135–145)
Sodium: 114 mmol/L — CL (ref 135–145)

## 2020-10-25 LAB — HEPARIN LEVEL (UNFRACTIONATED): Heparin Unfractionated: <0.1 IU/mL — ABNORMAL LOW (ref 0.30–0.70)

## 2020-10-25 LAB — GLUCOSE, CAPILLARY
Glucose-Capillary: 230 mg/dL — ABNORMAL HIGH (ref 70–99)
Glucose-Capillary: 234 mg/dL — ABNORMAL HIGH (ref 70–99)
Glucose-Capillary: 311 mg/dL — ABNORMAL HIGH (ref 70–99)
Glucose-Capillary: >600 mg/dL (ref 70–99)
Glucose-Capillary: >600 mg/dL (ref 70–99)
Glucose-Capillary: 159 mg/dL — ABNORMAL HIGH (ref 70–99)
Glucose-Capillary: 177 mg/dL — ABNORMAL HIGH (ref 70–99)
Glucose-Capillary: 158 mg/dL — ABNORMAL HIGH (ref 70–99)
Glucose-Capillary: 248 mg/dL — ABNORMAL HIGH (ref 70–99)
Glucose-Capillary: 175 mg/dL — ABNORMAL HIGH (ref 70–99)
Glucose-Capillary: 219 mg/dL — ABNORMAL HIGH (ref 70–99)

## 2020-10-25 LAB — COOXEMETRY PANEL
Carboxyhemoglobin: 2.2 % — ABNORMAL HIGH (ref 0.5–1.5)
Methemoglobin: 1.1 % (ref 0.0–1.5)
O2 Saturation: 68.6 %
Total hemoglobin: 8.7 g/dL — ABNORMAL LOW (ref 12.0–16.0)

## 2020-10-25 LAB — LACTATE DEHYDROGENASE: LDH: 361 U/L — ABNORMAL HIGH (ref 98–192)

## 2020-10-25 LAB — PROTIME-INR
INR: 2 — ABNORMAL HIGH (ref 0.8–1.2)
Prothrombin Time: 22.1 seconds — ABNORMAL HIGH (ref 11.4–15.2)

## 2020-10-25 LAB — MAGNESIUM: Magnesium: 2.5 mg/dL — ABNORMAL HIGH (ref 1.7–2.4)

## 2020-10-25 MED ORDER — INSULIN DETEMIR 100 UNIT/ML ~~LOC~~ SOLN
20.0000 [IU] | Freq: Two times a day (BID) | SUBCUTANEOUS | Status: DC
  Filled 2020-10-25: qty 0.2

## 2020-10-25 MED ORDER — DEXTROSE 50 % IV SOLN
0.0000 mL | INTRAVENOUS | Status: DC | PRN
Start: 2020-10-25 — End: 2020-11-03

## 2020-10-25 MED ORDER — INSULIN REGULAR(HUMAN) IN NACL 100-0.9 UT/100ML-% IV SOLN
INTRAVENOUS | Status: DC
  Administered 2020-10-25: 12 [IU]/h via INTRAVENOUS
  Filled 2020-10-25: qty 100

## 2020-10-25 MED ORDER — INSULIN ASPART 100 UNIT/ML ~~LOC~~ SOLN
3.0000 [IU] | Freq: Three times a day (TID) | SUBCUTANEOUS | Status: DC
  Administered 2020-10-25: 3 [IU] via SUBCUTANEOUS

## 2020-10-25 MED ORDER — WARFARIN SODIUM 2.5 MG PO TABS
2.5000 mg | ORAL_TABLET | Freq: Once | ORAL | Status: AC
  Administered 2020-10-25: 2.5 mg via ORAL
  Filled 2020-10-25: qty 1

## 2020-10-25 MED ORDER — ASPIRIN EC 81 MG PO TBEC
81.0000 mg | DELAYED_RELEASE_TABLET | Freq: Every day | ORAL | Status: DC
  Administered 2020-10-25 – 2020-11-14 (×21): 81 mg via ORAL
  Filled 2020-10-25 (×21): qty 1

## 2020-10-25 MED ORDER — POTASSIUM PHOSPHATES 15 MMOLE/5ML IV SOLN
20.0000 mmol | Freq: Once | INTRAVENOUS | Status: AC
  Administered 2020-10-25: 20 mmol via INTRAVENOUS
  Filled 2020-10-25: qty 6.67

## 2020-10-25 MED ORDER — INSULIN ASPART 100 UNIT/ML ~~LOC~~ SOLN: 0.0000 [IU] | Freq: Three times a day (TID) | SUBCUTANEOUS | Status: DC

## 2020-10-25 MED ORDER — MIDODRINE HCL 5 MG PO TABS
15.0000 mg | ORAL_TABLET | Freq: Three times a day (TID) | ORAL | Status: DC
  Administered 2020-10-25 – 2020-11-10 (×49): 15 mg via ORAL
  Filled 2020-10-25 (×50): qty 3

## 2020-10-25 MED ORDER — SORBITOL 70 % SOLN
30.0000 mL | Freq: Every day | Status: DC
  Administered 2020-10-25 – 2020-10-28 (×4): 30 mL via ORAL
  Filled 2020-10-25 (×4): qty 30

## 2020-10-25 NOTE — Progress Notes (Signed)
Physical Therapy Treatment Patient Details Name: Marcus Johnson. MRN: [id number] DOB: [birth year] Today's Date: 10/25/2020    History of Present Illness 60 y.o. male admitted 2/28 with ICM for LVAD workup. Impella placed 3/1. Heartmate 3 LVAD placed 3/10. CRRT started 3/15. PMHx: DM2, CAD s/Johnson CABG 0737, systolic HF due to ischemic cardiomyopathy with EF 20-25% (echo 12/15), DM2 and CKD. He is s/Johnson Medtronic single chamber ICD. Covid 1/22    PT Comments    Pt pleasant with improved cognition, interraction and mobility this session. Pt with decreased assist for all transfers and able to take additional steps in standing prior to presyncope. Pt remains limited by standing tolerance and RUE strength unable to physically or cognitively perform power transition with initial teaching started today. Pt educated for progression and HEP. RN present throughout to manage CRRT.   HR 104 SpO2 100% on RA Speed 5500, flow 5.3, power 4.2  PI initially 2.4 with drop to 1.9 after initial stand and rise to 7.9 with 2nd stand   Follow Up Recommendations  Supervision/Assistance - 24 hour;CIR     Equipment Recommendations  Other (comment) (TBD with progression)    Recommendations for Other Services       Precautions / Restrictions Precautions Precautions: Fall;Sternal Precaution Comments: LVAD, CRRT, dizziness/presyncope in standing Restrictions Weight Bearing Restrictions: Yes (sternal precautions)    Mobility  Bed Mobility Overal bed mobility: Needs Assistance Bed Mobility: Rolling;Sidelying to Sit Rolling: Min assist Sidelying to sit: Min assist       General bed mobility comments: cues for sequence with significant improvement in bil LE movement into flexion and rolling to side. Pt with min assist to transition to sitting with cues for scooting toward EOB and putting weight through RLE at EOB    Transfers Overall transfer level: Needs assistance   Transfers: Sit to/from  Stand;Stand Pivot Transfers Sit to Stand: Min assist;+2 physical assistance         General transfer comment: min +2 assist to stand with pt able to stand with good hand placement on thighs but needs mod +2 assist to sit due to blank stare/tremor/presyncope x 2.  Ambulation/Gait Ambulation/Gait assistance: Min assist;+2 physical assistance;+2 safety/equipment Gait Distance (Feet): 3 Feet Assistive device: 2 person hand held assist Gait Pattern/deviations: Shuffle   Gait velocity interpretation: <1.8 ft/sec, indicate of risk for recurrent falls General Gait Details: pt able to turn and step 2 steps forward from bed but then limited by presyncope and required assist to sit. 2nd trial standing from chair pt able to step 2' forward then back with repeat presyncope of stare and tremor with assist to sit   Stairs             Wheelchair Mobility    Modified Rankin (Stroke Patients Only)       Balance Overall balance assessment: Needs assistance   Sitting balance-Leahy Scale: Fair Sitting balance - Comments: EOb without physical assist   Standing balance support: Bilateral upper extremity supported Standing balance-Leahy Scale: Poor Standing balance comment: bil UE support in standing limited by dizziness                            Cognition Arousal/Alertness: Awake/alert Behavior During Therapy: Flat affect Overall Cognitive Status: Impaired/Different from baseline Area of Impairment: Memory                     Memory: Decreased short-term memory Following Commands: Follows  one step commands consistently     Problem Solving: Slow processing General Comments: pt with improved memory and awareness as well as command following this session. Pt recalling staff names and 2/4 sternal precautions. able to state name and purpose of controller. Educated for initial battery transition with pt unable to recall steps immediately after instruction and requiring  max assist for power transition due to lack of function of RUE and cognition      Exercises General Exercises - Lower Extremity Long Arc Quad: AROM;Both;20 reps;Seated Hip Flexion/Marching: AROM;Both;10 reps;Seated    General Comments        Pertinent Vitals/Pain Pain Score: 6  Pain Location: sacrum in sitting Pain Descriptors / Indicators: Discomfort;Grimacing Pain Intervention(s): Limited activity within patient's tolerance;Monitored during session;Repositioned    Home Living                      Prior Function            PT Goals (current goals can now be found in the care plan section) Progress towards PT goals: Progressing toward goals    Frequency    Min 4X/week      PT Plan Current plan remains appropriate    Co-evaluation              AM-PAC PT "6 Clicks" Mobility   Outcome Measure  Help needed turning from your back to your side while in a flat bed without using bedrails?: A Little Help needed moving from lying on your back to sitting on the side of a flat bed without using bedrails?: A Little Help needed moving to and from a bed to a chair (including a wheelchair)?: A Lot Help needed standing up from a chair using your arms (e.g., wheelchair or bedside chair)?: A Lot Help needed to walk in hospital room?: A Lot Help needed climbing 3-5 steps with a railing? : Total 6 Click Score: 13    End of Session Equipment Utilized During Treatment: Gait belt Activity Tolerance: Treatment limited secondary to medical complications (Comment) Patient left: in chair;with call bell/phone within reach;with nursing/sitter in room Nurse Communication: Mobility status;Precautions PT Visit Diagnosis: Unsteadiness on feet (R26.81);Other abnormalities of gait and mobility (R26.89);Muscle weakness (generalized) (M62.81);Difficulty in walking, not elsewhere classified (R26.2)     Time: 3710-6269 PT Time Calculation (min) (ACUTE ONLY): 34 min  Charges:   $Therapeutic Exercise: 8-22 mins $Therapeutic Activity: 8-22 mins                     Marcus Johnson, PT Acute Rehabilitation Services Pager: [phone] Office: [phone]    Marcus Johnson 10/25/2020, 9:13 AM

## 2020-10-25 NOTE — Progress Notes (Signed)
Marcus Johnson NEPHROLOGY PROGRESS NOTE  Assessment/ Plan:  Acute kidney injury on chronic kidney disease stage IIIa: With underlying chronic kidney disease likely from diabetes/hypertension and worsening renal function from hemodynamically mediated ATN versus cardiorenal syndrome.   -following CVP's, continue with CRRT.No changes with bags. Agree with keeping him net even -Heparin thru circuit  Acute exacerbation of chronic systolic heart failure with cardiogenic shock: status post LVAD. Remains on pressors with epinephrine, milrinone.  Volume status/UF managed with CRRT. Keeping net even  Anemia:Likely secondary to chronic disease and exacerbated by recent surgical losses.  Transfuse PRBC as needed.  Atrial fibrillation: Intermittently tachycardic, continue amiodarone and on Coumadin   Hypophosphatemia: Repleted IV phosphate, requiring daily.  Follow-up lab result.  Subjective: Seen and examined in ICU.  Tolerating CRRT, keeping net even per cardiology. Epi down to 3. CVP 6-7. Objective Vital signs in last 24 hours: Vitals:   10/25/20 0657 10/25/20 0700 10/25/20 0800 10/25/20 0900  BP:      Pulse:  (!) 57 93   Resp:  13 (!) 21 (!) 25  Temp: 98.3 F (36.8 C)     TempSrc: Oral     SpO2:  99% 100% 99%  Weight:      Height:       Weight change:   Intake/Output Summary (Last 24 hours) at 10/25/2020 0957 Last data filed at 10/25/2020 0900 Gross per 24 hour  Intake 2392.55 ml  Output 3681 ml  Net -1288.45 ml       Labs: Basic Metabolic Panel: Recent Labs  Lab 10/24/20 0432 10/24/20 1556 10/25/20 0537  NA 132* 130* 132*  K 4.8 4.7 4.3  CL 100 96* 99  CO2 23 24 24   GLUCOSE 184* 243* 241*  BUN 24* 26* 25*  CREATININE 1.86* 1.88* 1.76*  CALCIUM 7.7* 8.2* 8.3*  PHOS 2.1* 2.2* 1.9*   Liver Function Tests: Recent Labs  Lab 10/24/20 0432 10/24/20 1556 10/25/20 0537  ALBUMIN 2.4* 2.4* 2.3*   No results for input(s): LIPASE, AMYLASE in the last 168  hours. No results for input(s): AMMONIA in the last 168 hours. CBC: Recent Labs  Lab 10/19/20 0304 10/19/20 0324 10/20/20 0310 10/21/20 0930 10/22/20 0310 10/22/20 0439 10/23/20 0454 10/24/20 0432 10/25/20 0537  WBC 17.7*  --  13.8* 15.9* 14.8*  --  15.4* 16.2* 15.7*  NEUTROABS 14.8*  --  11.3*  --   --   --   --   --   --   HGB 8.5*   < > 8.1* 8.4* 8.3*   < > 8.5* 8.5* 8.3*  HCT 26.8*   < > 25.3* 26.6* 27.2*   < > 27.1* 27.6* 26.2*  MCV 88.7  --  89.4 89.6 90.4  --  89.4 90.5 89.7  PLT 262  --  255 323 327  --  377 393 421*   < > = values in this interval not displayed.   Cardiac Enzymes: No results for input(s): CKTOTAL, CKMB, CKMBINDEX, TROPONINI in the last 168 hours. CBG: Recent Labs  Lab 10/24/20 0544 10/24/20 1116 10/24/20 1601 10/24/20 2123 10/25/20 0645  GLUCAP 137* 284* 203* 230* 234*    Iron Studies: No results for input(s): IRON, TIBC, TRANSFERRIN, FERRITIN in the last 72 hours. Studies/Results: No results found.  Medications: Infusions: . amiodarone 30 mg/hr (10/25/20 0900)  . epinephrine 3 mcg/min (10/25/20 0900)  . lactated ringers    . lactated ringers Stopped (10/17/20 1655)  . milrinone 0.125 mcg/kg/min (10/25/20 0900)  .  prismasol BGK 2/2.5 replacement solution 200 mL/hr at 10/25/20 0751  . prismasol BGK 2/2.5 replacement solution 500 mL/hr at 10/25/20 0612  . prismasol BGK 4/2.5 1,500 mL/hr at 10/25/20 0701    Scheduled Medications: . aspirin EC  81 mg Oral Daily  . B-complex with vitamin C  1 tablet Oral Daily  . Chlorhexidine Gluconate Cloth  6 each Topical Daily  . feeding supplement  237 mL Oral TID BM  . gabapentin  100 mg Oral QHS  . insulin aspart  0-15 Units Subcutaneous TID WC  . insulin aspart  0-5 Units Subcutaneous QHS  . insulin aspart  3 Units Subcutaneous TID WC  . insulin detemir  15 Units Subcutaneous BID  . lidocaine  1 patch Transdermal Q24H  . mouth rinse  15 mL Mouth Rinse BID  . metoCLOPramide (REGLAN) injection   5 mg Intravenous Q12H  . midodrine  15 mg Oral TID WC  . pantoprazole  40 mg Oral Daily  . polyethylene glycol  17 g Oral Daily  . senna-docusate  2 tablet Oral BID  . sodium chloride flush  10-40 mL Intracatheter Q12H  . sorbitol  30 mL Oral Daily  . warfarin  2.5 mg Oral ONCE-1600  . Warfarin - Pharmacist Dosing Inpatient   Does not apply q1600    have reviewed scheduled and prn medications.  Physical Exam: General: looking tired, NAD, sitting up in bed Heart: Mechanical heart sound/lvad hum Lungs: cta bl Abdomen:soft, Non-tender, non-distended Extremities: no edema Dialysis Access: Temporary RIJ HD catheter.  Rankin Coolman 10/25/2020,9:57 AM  LOS: 27 days

## 2020-10-25 NOTE — Progress Notes (Signed)
ANTICOAGULATION CONSULT NOTE - Follow Up Consult  Pharmacy Consult for Warfarin Indication: LVAD  Allergies  Allergen Reactions  . Meclizine Hcl Anaphylaxis and Swelling  . Ivabradine Nausea Only    Patient Measurements: Height: 5' 10.5" (179.1 cm) Weight: 79.3 kg (174 lb 13.2 oz) IBW/kg (Calculated) : 74.15 Heparin Dosing Weight: 89.2 kg  Vital Signs: Temp: 98.3 F (36.8 C) (03/22 0657) Temp Source: Oral (03/22 0657) Pulse Rate: 96 (03/22 0500)  Labs: Recent Labs    10/23/20 0454 10/23/20 1604 10/24/20 0432 10/24/20 1556 10/25/20 0537  HGB 8.5*  --  8.5*  --  8.3*  HCT 27.1*  --  27.6*  --  26.2*  PLT 377  --  393  --  421*  LABPROT 20.6*  --  21.5*  --  22.1*  INR 1.8*  --  1.9*  --  2.0*  HEPARINUNFRC  --   --   --   --  <0.10*  CREATININE 1.85*   < > 1.86* 1.88* 1.76*   < > = values in this interval not displayed.    Estimated Creatinine Clearance: 47.4 mL/min (A) (by C-G formula based on SCr of 1.76 mg/dL (H)).   Medical History: Past Medical History:  Diagnosis Date  . AICD (automatic cardioverter/defibrillator) present    Single-chamber  implantable cardiac defibrillator - Medtronic  . Atrial fibrillation ([place])   . Cataract    Mixed form OU  . CHF (congestive heart failure) ([place])   . Chronic kidney disease   . Chronic kidney disease (CKD), stage III (moderate) (HCC)   . Chronic systolic heart failure ([place])    a. Echo 5/13: Mild LVE, mild LVH, EF 10%, anteroseptal, lateral, apical AK, mild MR, mild LAE, moderately reduced RVSF, mild RAE, PASP 60;  b. 07/2014 Echo: EF 20-2%, diff HK, AKI of antsep/apical/mid-apicalinferior, mod reduced RV.  Marland Kitchen Coronary artery disease    a. s/p CABG 2002 b. LHC 5/13:  dLM 80%, LAD subtotally occluded, pCFX occluded, pRCA 50%, mid? Occlusion with high take off of the PDA with 70% multiple lesions-not bypassed and supplies collaterals to LAD, LIMA-IM/ramus ok, S-OM ok, S-PLA branches ok. Medical therapy was recommended  .  COVID   . Diabetic retinopathy (HCC)    NPDR OD, PDR OS  . Dyspnea   . Gout    "on daily RX" (01/08/2018)  . Hypertension   . Hypertensive retinopathy    OU  . Ischemic cardiomyopathy    a. Echo 5/13: Mild LVE, mild LVH, EF 10%, anteroseptal, lateral, apical AK, mild MR, mild LAE, moderately reduced RVSF, mild RAE, PASP 60;  b. 01/2012 s/p MDT [id number] Protecta XT VR AICD;  c. 07/2014 Echo: EF 20-2%, diff HK, AKI of antsep/apical/mid-apicalinferior, mod reduced RV.  Marland Kitchen MRSA (methicillin resistant Staphylococcus aureus)    Status post right foot plantar deep infection with MRSA status post  I&D 10/2008  . Myocardial infarction [facility])    "was told I'd had several before heart OR in 2002" (01/08/2018)  . Noncompliance   . Peripheral neuropathy   . Retinopathy, diabetic, background ([place])   . Syncope   . Type II diabetes mellitus ([place])    requiring insulin   . Vitreous hemorrhage, left (HCC)    and proliferative diabetic retinopathy    Assessment: 60 yo M s/p new LVAD implant with HeartMate III on 3/10. Warfarin started per MD on 3/12, and pharmacy asked to take over warfarin dosing.   INR now therapeutic at 2 today. Also adjustingTF  and Ensure so varying amounts vitamin K, currently taking ~2 Ensures daily. CBC stable. No overt bleeding. LDH stable upper 300s. Decreasing aspirin to 81 mg daily given therapeutic INR.  Goal of Therapy:  INR 2-2.5 Monitor platelets by anticoagulation protocol: Yes   Plan:  Warfarin 2.5 mg PO tonight  Daily PT/INR, CBC, monitor s/sx bleeding Monitor aPTT/HL while receiving heparin through CRRT to ensure not supratherapeutic  [name], PharmD, BCPS PGY2 Cardiology Pharmacy Resident Phone: [phone] 10/25/2020  7:18 AM  Please check [email or web address] for unit-specific pharmacy phone numbers.

## 2020-10-25 NOTE — Progress Notes (Addendum)
Patient ID: Marcus Johnson., male   DOB: 04/25/1961, 60 y.o.   MRN: [id number]     Advanced Heart Failure Rounding Note  PCP-Cardiologist: No primary care provider on file.   Subjective:    - 2/27 Milrinone switched to dobutamine 5 mcg.  - 2/28 Moved to ICU. Dobutamine increased to 7.5 mcg and Norepi 3 mcg added. Diuretics held.  - 3/1 Impella 5.5 placed.  - 3/4 Teeth removed - 3/10 HM3 LVAD placed -3/13 VAD speed increased to 5500. [name] out  - 3/14 Nephrology consulted. Given 1UPRBCs  - 3/15 Started CRRT. Given 1UPRBCs.   He continues on epinephrine 4, milrinone 0.125, amiodarone 30.  Co-ox 69%.   Remains on CRRT, pulling 50 cc/hr currently Weight now seems below pre-op and down another 3 lb today.  CVP 6-7. Still anuric.   On heparin in CRRT circuit only. No bleeding  LDH 293>444>597>>>>485 >467 > 541 > 414 > 370>361   No complaints this morning. Sitting up eating breakfast.    Echo: EF 20-25%, normal RV, PASP 61, dilated IVC.   LVAD Interrogation HM 3: Speed: 1324 Flow:5.4   PI: 2.1   Power: 4.1.  7 PI events this morning.   VAD interrogated personally.    Objective:   Weight Range: 79.3 kg Body mass index is 24.73 kg/m.   Vital Signs:   Temp:  [97.7 F (36.5 C)-98.3 F (36.8 C)] 98.3 F (36.8 C) (03/22 0657) Pulse Rate:  [56-122] 96 (03/22 0500) Resp:  [9-23] 18 (03/22 0600) BP: (77-124)/(66-108) 77/66 (03/21 1900) SpO2:  [95 %-100 %] 100 % (03/22 0600) Weight:  [79.3 kg-80.3 kg] 79.3 kg (03/22 0600) Last BM Date: 10/21/20  Weight change: Filed Weights   10/23/20 0500 10/24/20 1000 10/25/20 0600  Weight: 82.9 kg 80.3 kg 79.3 kg    Intake/Output:   Intake/Output Summary (Last 24 hours) at 10/25/2020 0730 Last data filed at 10/25/2020 0700 Gross per 24 hour  Intake 2518.24 ml  Output 3630 ml  Net -1111.76 ml      Physical Exam   CVP 6-7  General: Well appearing this am. Sitting up in bed. No distress.  HEENT: Normal. Neck: Supple, JVP  6-7 cm.  + Rt IJ HD cath. Carotids OK.  Cardiac:  Mechanical heart sounds with LVAD hum present.  Lungs:  Clear bilaterally. No wheezing .  Abdomen:  Nontender, nondistended, no HSM. No bruits or masses. Active bowel sounds  LVAD exit site: Well-healed and incorporated. Dressing dry and intact. No erythema or drainage. Stabilization device present and accurately applied. Driveline dressing changed daily per sterile technique. Extremities:  Warm and dry. No cyanosis, clubbing, rash, or edema.  Neuro:  Alert & oriented x 3. Cranial nerves grossly intact. Moves all 4 extremities w/o difficulty. Affect pleasant     Telemetry   NSR 90s  Personally reviewed   Labs    CBC Recent Labs    10/24/20 0432 10/25/20 0537  WBC 16.2* 15.7*  HGB 8.5* 8.3*  HCT 27.6* 26.2*  MCV 90.5 89.7  PLT 393 401*   Basic Metabolic Panel Recent Labs    10/24/20 1556 10/24/20 1645 10/25/20 0537  NA 130*  --  132*  K 4.7  --  4.3  CL 96*  --  99  CO2 24  --  24  GLUCOSE 243*  --  241*  BUN 26*  --  25*  CREATININE 1.88*  --  1.76*  CALCIUM 8.2*  --  8.3*  MG  --  2.5* 2.5*  PHOS 2.2*  --  1.9*   Liver Function Tests Recent Labs    10/24/20 1556 10/25/20 0537  ALBUMIN 2.4* 2.3*   No results for input(s): LIPASE, AMYLASE in the last 72 hours. Cardiac Enzymes No results for input(s): CKTOTAL, CKMB, CKMBINDEX, TROPONINI in the last 72 hours.  BNP: BNP (last 3 results) Recent Labs    10/14/20 0254 10/20/20 0305  BNP 1,097.8* 466.7*    ProBNP (last 3 results) No results for input(s): PROBNP in the last 8760 hours.   D-Dimer No results for input(s): DDIMER in the last 72 hours. Hemoglobin A1C No results for input(s): HGBA1C in the last 72 hours. Fasting Lipid Panel No results for input(s): CHOL, HDL, LDLCALC, TRIG, CHOLHDL, LDLDIRECT in the last 72 hours. Thyroid Function Tests No results for input(s): TSH, T4TOTAL, T3FREE, THYROIDAB in the last 72 hours.  Invalid input(s):  FREET3  Other results:   Imaging    No results found.   Medications:     Scheduled Medications: . aspirin EC  325 mg Oral Daily   Or  . aspirin  324 mg Per Tube Daily   Or  . aspirin  300 mg Rectal Daily  . B-complex with vitamin C  1 tablet Oral Daily  . Chlorhexidine Gluconate Cloth  6 each Topical Daily  . feeding supplement  237 mL Oral TID BM  . gabapentin  100 mg Oral QHS  . insulin aspart  0-15 Units Subcutaneous TID WC  . insulin aspart  0-5 Units Subcutaneous QHS  . insulin detemir  15 Units Subcutaneous BID  . lidocaine  1 patch Transdermal Q24H  . mouth rinse  15 mL Mouth Rinse BID  . metoCLOPramide (REGLAN) injection  5 mg Intravenous Q12H  . midodrine  10 mg Oral TID WC  . pantoprazole  40 mg Oral Daily  . polyethylene glycol  17 g Oral Daily  . senna-docusate  2 tablet Oral BID  . sodium chloride flush  10-40 mL Intracatheter Q12H  . Warfarin - Pharmacist Dosing Inpatient   Does not apply q1600    Infusions: . amiodarone 30 mg/hr (10/25/20 0700)  . epinephrine 4 mcg/min (10/25/20 0700)  . lactated ringers    . lactated ringers Stopped (10/17/20 1655)  . milrinone 0.125 mcg/kg/min (10/25/20 0700)  . prismasol BGK 2/2.5 replacement solution 200 mL/hr at 10/24/20 0346  . prismasol BGK 2/2.5 replacement solution 500 mL/hr at 10/25/20 0612  . prismasol BGK 4/2.5 1,500 mL/hr at 10/25/20 0701    PRN Medications: dextrose, heparin, heparin, morphine injection, ondansetron (ZOFRAN) IV, oxyCODONE, sodium chloride flush, traMADol, white petrolatum    Assessment/Plan   1.Acute on chronic systolic HF -> cardiogenic shock:ICM.HasMedtronic ICD.EF 15%. Low output persisted despite milrinone and then dobutamine with rise in creatinine to 2.5. Impella 5.5 placed 3/1.  Creatinine improved to 1.7 and HM3 LVAD was placed on 3/10. Developed post-op renal failure and now on CVVH.  Patient currently on milrinone 0.125 + epi 4, co-ox 68%.  CVP 6-7. Weight down to  pre-op or below.  - Continue CRRT, pulling  -50 cc/hr net today.  - Continue weaning epinephrine  - Continue milrinone 0.125.  - Continue midodrine 10 mg tid.  2. LVAD: 3/13 VAD speed increased to 5500.  VAD interrogated personally. Parameters stable.  LDH stable at 361.  - INR 2.0 today. Reduce ASA down to 81 mg daily  - Heparin running in CRT circuit.  D/w TCTS and PharmD - DL site ok -  Warfarin per pharmacy.  3. CAD s/p CABG2002:Last cath in 6/19 with patent grafts.  - Continue Crestor 40 mg daily.  4. AKI on CKD Stage 3b: Suspect that this is a combination of cardiorenal syndrome and diabetic nephropathy and possibly ATN. Developed post-op AKI. Started on CRRT 3/15.  - Nephrology appreciated. D/w Renal. Continue CRRT, pulling 50 cc/hr net UF. Hopeful for renal recovery.  5. Atrial flutter/fibrillation: s/p DC-CV 08/21/18.Looks like NSR this morning.  - Continue amio drip 30 mg per hour.  6. Wound R Foot: Partial thickness skin loss noted. Cover with mepilex border and change every 5 days.  7. Right arm pain: Think neuropathic, per Dr. Cyndia Bent patient had stretch of brachial plexus with Impella placement.  8. ID:  Afebrile, negative cultures. Follow closely  9. BX:UXYBFXO of very poor control but hgbA1ctrending down. Most recent 7.7%  - Cover with SSI. 10. Anemia: hgb stable at 8.3 11. Deconditioning - PT/OT following  - will need CIR once medically stable    Length of Stay: [address], PA-C  10/25/2020, 7:30 AM  Advanced Heart Failure Team Pager [phone] (M-F; [place])  Please contact [facility] for night-coverage after hours (4p -7a ) and weekends on [email or web address]  Patient seen with PA, agree with the above note.   MAP in 70s, CVP 6-7, co-ox 69%.    He was able to stand by bed for weight today without dizziness.    General: Well appearing this am. NAD.  HEENT: Normal. Neck: Supple, JVP 7-8 cm. Carotids OK.  Cardiac:  Mechanical heart sounds with  LVAD hum present.  Lungs:  CTAB, normal effort.  Abdomen:  NT, ND, no HSM. No bruits or masses. +BS  LVAD exit site: Well-healed and incorporated. Dressing dry and intact. No erythema or drainage. Stabilization device present and accurately applied. Driveline dressing changed daily per sterile technique. Extremities:  Warm and dry. No cyanosis, clubbing, rash, or edema.  Neuro:  Alert & oriented x 3. Cranial nerves grossly intact. Moves all 4 extremities w/o difficulty. Affect pleasant    Wean epinephrine to 3 today (slow wean to off).  Stable LVAD parameters with flow 5.3, PI higher today.  Will need repeat echo once epinephrine weaned down. Increase midodrine to 15 mg tid to maintain MAP while weaning epinephrine.   With CVP 6-7, will run CVVH even today.  Think we can stop CVVH soon and watch for renal recovery. Appreciate nephrology assistance.   Remains in NSR on amiodarone gtt.   INR 2 today, decrease ASA to 81 daily.   Continue aggressive PT/OT.   CRITICAL CARE Performed by: Loralie Champagne  Total critical care time: 35 minutes  Critical care time was exclusive of separately billable procedures and treating other patients.  Critical care was necessary to treat or prevent imminent or life-threatening deterioration.  Critical care was time spent personally by me on the following activities: development of treatment plan with patient and/or surrogate as well as nursing, discussions with consultants, evaluation of patient's response to treatment, examination of patient, obtaining history from patient or surrogate, ordering and performing treatments and interventions, ordering and review of laboratory studies, ordering and review of radiographic studies, pulse oximetry and re-evaluation of patient's condition.  Loralie Champagne 10/25/2020 7:58 AM

## 2020-10-25 NOTE — Progress Notes (Signed)
Inpatient Diabetes Program Recommendations  AACE/ADA: New Consensus Statement on Inpatient Glycemic Control (2015)  Target Ranges:  Prepandial:   less than 140 mg/dL      Peak postprandial:   less than 180 mg/dL (1-2 hours)      Critically ill patients:  140 - 180 mg/dL   Lab Results  Component Value Date   GLUCAP >600 (HH) 10/25/2020   HGBA1C 7.8 (H) 10/12/2020    Review of Glycemic Control Results for Marcus Johnson, Marcus Johnson (MRN [id number]) as of 10/25/2020 16:00  Ref. Range 10/24/2020 05:44 10/24/2020 11:16 10/24/2020 16:01 10/24/2020 21:23 10/25/2020 06:45 10/25/2020 11:31 10/25/2020 15:19  Glucose-Capillary Latest Ref Range: 70 - 99 mg/dL 137 (H) 284 (H) 203 (H) 230 (H) 234 (H) 311 (H) >600 (HH)   Diabetes history:  Outpatient Diabetes medications: Jardiance 10 mg Daily, Novolog 5 tid, Lantus 50 units Daily Current orders for Inpatient glycemic control:  Levemir 20 units bid Novolog 0-15 units tid + hs Novolog 3 units tid meal coverage  A1c 7.8 % on 3/9 Ensure Enlive tid between meals  Inpatient Diabetes Program Recommendations:    -  IV insulin for now, if significantly elevated, over night to see what the pt insulin needs are.  Pt on regular diet, eating 90% of meal and more, consuming supplements as well. Will need higher SQ regimen. IV insulin for now will follow trends. Based on his glucose trends DM Coordinator RN can dose insulin in the morning when our team arrives based on his needs overnight (approx 9am)  Thanks,  Tama Headings RN, MSN, BC-ADM Inpatient Diabetes Coordinator Team Pager [phone] (8a-5p)

## 2020-10-25 NOTE — Plan of Care (Signed)
  Problem: Clinical Measurements: Goal: Cardiovascular complication will be avoided Outcome: Progressing   Problem: Activity: Goal: Risk for activity intolerance will decrease Outcome: Progressing   Problem: Nutrition: Goal: Adequate nutrition will be maintained Outcome: Progressing Note: Pt ate 90-95% of all meals today   Problem: Elimination: Goal: Will not experience complications related to bowel motility Outcome: Progressing Note: Had smear BM    Problem: Pain Managment: Goal: General experience of comfort will improve Outcome: Progressing   Problem: Activity: Goal: Capacity to carry out activities will improve Outcome: Progressing   Problem: Cardiac: Goal: Ability to achieve and maintain adequate cardiopulmonary perfusion will improve Outcome: Progressing

## 2020-10-25 NOTE — Plan of Care (Signed)
  Problem: Nutrition: Goal: Adequate nutrition will be maintained Outcome: Progressing   Problem: Coping: Goal: Level of anxiety will decrease Outcome: Progressing   Problem: Elimination: Goal: Will not experience complications related to bowel motility Outcome: Not Met (add Reason)   Problem: Pain Managment: Goal: General experience of comfort will improve Outcome: Progressing   Problem: Safety: Goal: Ability to remain free from injury will improve Outcome: Progressing

## 2020-10-25 NOTE — Progress Notes (Signed)
LVAD Coordinator Rounding Note:  Admitted 09/28/20 due to worsening heart failure symptoms. Recent COVID infection January 2022; did not take medications for 3-4 weeks.   S/p dental extractions 10/07/20 per Dr Benson Norway.  HM III LVAD implanted on 10/13/20 by Dr Cyndia Bent under Destination Therapy criteria.  Pt sitting up in chair. Reports his "butt really hurts" and is requesting to go back to bed. Pt is a 2- assist with movement, requiring steady lift for transfers. Complaining of surgical chest pain 4/10, which he deems "accecptable."   Reports continued weakness/numbness of his right hand. He has ball and putty at bedside that he is to work with throughout the day to increase dexterity in his hand. He states "I just can't do it" when asked to demonstrate exercises with ball. Will need extensive therapy in order to independently perform power source changes/handle VAD equipment.   Started VAD education with patient, pt's brother [name] , and pt's son [name]. See documentation below.   Remains anuric. Dr. Aundra Dubin decreased dialysis to keep even. Creatinine 1.76 today.   Vital signs: Temp: 97.8 HR: 100 Doppler: 70 Auto cuff: not documented O2 Sat: 95% on RA Wt: 200.1>221.7>219.5>212.7>223>224.6>217.5>212>196.6>192.4>182.7>174.8 lbs   LVAD interrogation reveals:  Speed: 5500 Flow: 5.4 Power: 4.1w PI: 1.8 Hematocrit: 26  Alarms: none Events: 75 PI events so far today  Fixed speed: 5500 Low speed limit: 5200  Drive Line: Existing VAD dressing removed and site care performed using sterile technique. Drive line exit site cleaned with Chlora prep applicators x 2, allowed to dry, and gauze dressing with silver strip re-applied. Exit site healing and unincorporated the velour is fully implanted at exit site. There is 1 suture visible that is wrapped around the driveline. Decreased amount of serousanguineous drainage on gauze dressing. No redness, tenderness, foul odor or rash noted. Few inches of  drive line from exit site to modular cable connection, making positioning/anchoring of drive line difficult. Drive line anchor correctly applied. Continue daily dressing changes by VAD coordinator or nurse champion. Next dressing change is due 10/26/20.  Labs:  LDH trend: 444>670>575>571>485>467>541>414>370>361  INR trend: 1.4>1.7>1.8>1.7>1.6>1.8>1.9>2.0  Creatinine: 1.84>3.0>3.4>2.73>2.04>1.70>1.85>1.83>1.86>1.76  Anticoagulation Plan: -INR Goal: 2.0-2.5 -ASA Dose: 325 mg until INR therapeutic  Blood Products:  IntraOp:   1 Platelet  6 FFP  3 PRBC  1035 cell saver Postop:   10/13/20:   4 FFP  4 PRBC    10/14/20:  1 FFP  1 PRBC   10/16/20:  1 PRBC   10/18/20:  1 PRBC   10/16/20:  1 PRBC   10/18/20:  1 PRBC  Device: - Medtronic single -Therapies: off   Arrythmias: VT in OR requiring defibrillation   Renal: CRRT started 10/19/20 (keeping even)  Drips: Epinephrine 3 mcg/min Milrinone 0.125 mcg/kg/min Amiodarone 30 mg/hr  Patient Education: 1. Discussed/demonstrated dressing change step-by-step with patient, [name] (brother), and [name] (son). Reiterated importance of sterile technique, mask, and cap.  2. Hands-on power source change from patient cable to batteries and vice versa with patient, [name], and [name]. Patient struggled with power source change due to right hand weakness/numbness.   3. Hands-on demonstration/practice with batteries/clips, how to check battery life on battery, and power source change from patient cable to batteries with pt, [name], and [place].  4. Briefly discussed Charity fundraiser and importance of placing batteries on charger after use with [name] and [place]; patient sleeping. Briefly discussed importance of rotating battery sets weekly.  5. Briefly discussed home equipment with [place]; patient sleeping. 6. Briefly discussed  patient's controller and demonstrated self-test with [name] and [name]; patient sleeping.  7. Instructed [name]  and [name] to continue practicing don/doff sterile gloves in anticipation of beginning dressing change hands-on later this week/next week.   Plan/Recommendations:  1. Call VAD coordinator for equipment or drive line issues 2. Daily drive line dressing change per VAD coordinator or nurse champion. Next dressing change due 10/26/20  Emerson Monte RN [place] Coordinator  Office: [phone]  24/7 Pager: [phone]

## 2020-10-25 NOTE — Progress Notes (Signed)
Patient ID: Marcus Johnson., male   DOB: 1960-12-04, 60 y.o.   MRN: [id number] HeartMate 2 Rounding Note  Subjective:    Slept well last night and in good spirits today. Up to chair for two hrs.  He has been hemodynamically stable on epi 4, milrinone 0.125.  Co-ox 68.6 CVP 6 this am, 12 this afternoon.   CRRT to maintain even to slightly positive. Wt down to 175.  LVAD INTERROGATION:  HeartMate II LVAD:  Flow 5.3 liters/min, speed 5500, power 4, PI 2.9.    Objective:    Vital Signs:   Temp:  [97.6 F (36.4 C)-98.3 F (36.8 C)] 97.6 F (36.4 C) (03/22 1516) Pulse Rate:  [46-101] 92 (03/22 1500) Resp:  [9-25] 19 (03/22 1500) BP: (77-124)/(66-108) 77/66 (03/21 1900) SpO2:  [95 %-100 %] 99 % (03/22 1500) Weight:  [79.3 kg] 79.3 kg (03/22 0600) Last BM Date: 10/21/20 Mean arterial Pressure 70  Intake/Output:   Intake/Output Summary (Last 24 hours) at 10/25/2020 1654 Last data filed at 10/25/2020 1600 Gross per 24 hour  Intake 2711.12 ml  Output 3255 ml  Net -543.88 ml     Physical Exam: General:  Well appearing. No resp difficulty HEENT: normal Neck: supple. JVP . Carotids 2+ bilat; no bruits. No lymphadenopathy or thryomegaly appreciated. Cor: Mechanical heart sounds with LVAD hum present. Lungs: clear Abdomen: soft, nontender, nondistended. No hepatosplenomegaly. No bruits or masses. Good bowel sounds. Extremities: no cyanosis, clubbing, rash, edema Neuro: alert & orientedx3, cranial nerves grossly intact. moves all 4 extremities w/o difficulty. Affect pleasant  Telemetry: sinus 90's  Labs: Basic Metabolic Panel: Recent Labs  Lab 10/22/20 0310 10/22/20 0439 10/23/20 0454 10/23/20 1604 10/24/20 0432 10/24/20 1556 10/24/20 1645 10/25/20 0537 10/25/20 1521  NA 132*   < > 129* 129* 132* 130*  --  132*  --   K 4.5   < > 5.2* 5.2* 4.8 4.7  --  4.3  --   CL 97*   < > 95* 96* 100 96*  --  99  --   CO2 25   < > 26 25 23 24   --  24  --   GLUCOSE 82   < >  265* 151* 184* 243*  --  241* 884*  BUN 26*   < > 26* 25* 24* 26*  --  25*  --   CREATININE 1.69*   < > 1.85* 1.83* 1.86* 1.88*  --  1.76*  --   CALCIUM 8.3*   < > 8.2* 8.5* 7.7* 8.2*  --  8.3*  --   MG 2.6*  --  2.5*  --  2.4  --  2.5* 2.5*  --   PHOS 1.9*   < > 2.3* 2.1* 2.1* 2.2*  --  1.9*  --    < > = values in this interval not displayed.    Liver Function Tests: Recent Labs  Lab 10/23/20 0454 10/23/20 1604 10/24/20 0432 10/24/20 1556 10/25/20 0537  ALBUMIN 2.4* 2.6* 2.4* 2.4* 2.3*   No results for input(s): LIPASE, AMYLASE in the last 168 hours. No results for input(s): AMMONIA in the last 168 hours.  CBC: Recent Labs  Lab 10/19/20 0304 10/19/20 0324 10/20/20 0310 10/21/20 0930 10/22/20 0310 10/22/20 0439 10/23/20 0454 10/24/20 0432 10/25/20 0537  WBC 17.7*  --  13.8* 15.9* 14.8*  --  15.4* 16.2* 15.7*  NEUTROABS 14.8*  --  11.3*  --   --   --   --   --   --  HGB 8.5*   < > 8.1* 8.4* 8.3* 9.5* 8.5* 8.5* 8.3*  HCT 26.8*   < > 25.3* 26.6* 27.2* 28.0* 27.1* 27.6* 26.2*  MCV 88.7  --  89.4 89.6 90.4  --  89.4 90.5 89.7  PLT 262  --  255 323 327  --  377 393 421*   < > = values in this interval not displayed.    INR: Recent Labs  Lab 10/21/20 0416 10/22/20 0310 10/23/20 0454 10/24/20 0432 10/25/20 0537  INR 1.6* 1.6* 1.8* 1.9* 2.0*    Other results:  EKG:   Imaging:  No results found.   Medications:     Scheduled Medications: . aspirin EC  81 mg Oral Daily  . B-complex with vitamin C  1 tablet Oral Daily  . Chlorhexidine Gluconate Cloth  6 each Topical Daily  . feeding supplement  237 mL Oral TID BM  . gabapentin  100 mg Oral QHS  . lidocaine  1 patch Transdermal Q24H  . mouth rinse  15 mL Mouth Rinse BID  . metoCLOPramide (REGLAN) injection  5 mg Intravenous Q12H  . midodrine  15 mg Oral TID WC  . pantoprazole  40 mg Oral Daily  . polyethylene glycol  17 g Oral Daily  . senna-docusate  2 tablet Oral BID  . sodium chloride flush  10-40  mL Intracatheter Q12H  . sorbitol  30 mL Oral Daily  . Warfarin - Pharmacist Dosing Inpatient   Does not apply q1600     Infusions: . amiodarone 30 mg/hr (10/25/20 1600)  . epinephrine 3 mcg/min (10/25/20 1600)  . insulin 12 Units/hr (10/25/20 1634)  . lactated ringers    . lactated ringers Stopped (10/17/20 1655)  . milrinone 0.125 mcg/kg/min (10/25/20 1600)  . potassium PHOSPHATE IVPB (in mmol) 85 mL/hr at 10/25/20 1600  . prismasol BGK 2/2.5 replacement solution 200 mL/hr at 10/25/20 0751  . prismasol BGK 2/2.5 replacement solution 500 mL/hr at 10/25/20 1646  . prismasol BGK 4/2.5 1,500 mL/hr at 10/25/20 1426     PRN Medications:  dextrose, dextrose, heparin, heparin, morphine injection, ondansetron (ZOFRAN) IV, oxyCODONE, sodium chloride flush, traMADol, white petrolatum   Assessment:   1. Acute on chronic systolic congestive heart failure due to ischemic cardiomyopathy with ejection fraction of 20 to 25% admitted with class IIIb symptoms. [id number] LVAD.  2. CAD s/p CABG 2002 with patent grafts on last cath.  3. Stage 3 CKD due to diabetic nephropathy and cardiorenal syndrome. Baseline recently 1.8.CRRT even today. He is now down to 175, 25lbs below preop. 4. Atrial fib/flutter:Sinus alternating with AFon amio IV.  5. Right brachial plexus stretch from preop Impella and hematoma around the axillary artery. This should improve with time.He has pain in shoulder and numbness in hand. 6. Expected acute postop blood loss anemia.Hgb stable. 7.Blisters on buttocks probably due to edema and pressure. Turn frequently and observe.  8. DM: glucose underfaircontrol on Levemir and SSI. 9.  Eating well and says he is drinking two Ensure per day. Plan/Discussion:    Hemodynamics stable. Slowly wean epi. He is on the dry side. Agree with keeping even to positive. Could consider holding CRRT for a few days to see what happens although fluid accumulation may set him backwards from  a rehab point of view.  He seems to be eating well and incisions healing.   INR 2.0. Vit K in Ensure may counteract the Coumadin.  Continue PT, OT  Could probably switch amio to po.  Length of Stay: [address]  Fernande Boyden [facility] 10/25/2020, 4:54 PM

## 2020-10-26 ENCOUNTER — Inpatient Hospital Stay (HOSPITAL_COMMUNITY): Payer: Medicare HMO

## 2020-10-26 DIAGNOSIS — Z95811 Presence of heart assist device: Secondary | ICD-10-CM | POA: Diagnosis not present

## 2020-10-26 DIAGNOSIS — I5023 Acute on chronic systolic (congestive) heart failure: Secondary | ICD-10-CM | POA: Diagnosis not present

## 2020-10-26 LAB — CBC
HCT: 26.7 % — ABNORMAL LOW (ref 39.0–52.0)
HCT: 25.9 % — ABNORMAL LOW (ref 39.0–52.0)
Hemoglobin: 8.1 g/dL — ABNORMAL LOW (ref 13.0–17.0)
Hemoglobin: 8 g/dL — ABNORMAL LOW (ref 13.0–17.0)
MCH: 27.9 pg (ref 26.0–34.0)
MCH: 28 pg (ref 26.0–34.0)
MCHC: 30.3 g/dL (ref 30.0–36.0)
MCHC: 30.9 g/dL (ref 30.0–36.0)
MCV: 92.1 fL (ref 80.0–100.0)
MCV: 90.6 fL (ref 80.0–100.0)
Platelets: 396 10*3/uL (ref 150–400)
Platelets: 370 10*3/uL (ref 150–400)
RBC: 2.9 MIL/uL — ABNORMAL LOW (ref 4.22–5.81)
RBC: 2.86 MIL/uL — ABNORMAL LOW (ref 4.22–5.81)
RDW: 18.5 % — ABNORMAL HIGH (ref 11.5–15.5)
RDW: 18.6 % — ABNORMAL HIGH (ref 11.5–15.5)
WBC: 16.8 10*3/uL — ABNORMAL HIGH (ref 4.0–10.5)
WBC: 17.3 10*3/uL — ABNORMAL HIGH (ref 4.0–10.5)
nRBC: 0.2 % (ref 0.0–0.2)
nRBC: 0.1 % (ref 0.0–0.2)

## 2020-10-26 LAB — RENAL FUNCTION PANEL
Albumin: 2.3 g/dL — ABNORMAL LOW (ref 3.5–5.0)
Albumin: 2.4 g/dL — ABNORMAL LOW (ref 3.5–5.0)
Anion gap: 9 (ref 5–15)
Anion gap: 9 (ref 5–15)
BUN: 24 mg/dL — ABNORMAL HIGH (ref 6–20)
BUN: 31 mg/dL — ABNORMAL HIGH (ref 6–20)
CO2: 25 mmol/L (ref 22–32)
CO2: 25 mmol/L (ref 22–32)
Calcium: 8.5 mg/dL — ABNORMAL LOW (ref 8.9–10.3)
Calcium: 8.9 mg/dL (ref 8.9–10.3)
Chloride: 97 mmol/L — ABNORMAL LOW (ref 98–111)
Chloride: 96 mmol/L — ABNORMAL LOW (ref 98–111)
Creatinine, Ser: 1.68 mg/dL — ABNORMAL HIGH (ref 0.61–1.24)
Creatinine, Ser: 2.55 mg/dL — ABNORMAL HIGH (ref 0.61–1.24)
GFR, Estimated: 47 mL/min — ABNORMAL LOW (ref >60)
GFR, Estimated: 28 mL/min — ABNORMAL LOW (ref >60)
Glucose, Bld: 146 mg/dL — ABNORMAL HIGH (ref 70–99)
Glucose, Bld: 269 mg/dL — ABNORMAL HIGH (ref 70–99)
Phosphorus: 2 mg/dL — ABNORMAL LOW (ref 2.5–4.6)
Phosphorus: 2.6 mg/dL (ref 2.5–4.6)
Potassium: 4.7 mmol/L (ref 3.5–5.1)
Potassium: 5.8 mmol/L — ABNORMAL HIGH (ref 3.5–5.1)
Sodium: 131 mmol/L — ABNORMAL LOW (ref 135–145)
Sodium: 130 mmol/L — ABNORMAL LOW (ref 135–145)

## 2020-10-26 LAB — GLUCOSE, CAPILLARY
Glucose-Capillary: 159 mg/dL — ABNORMAL HIGH (ref 70–99)
Glucose-Capillary: 218 mg/dL — ABNORMAL HIGH (ref 70–99)
Glucose-Capillary: 216 mg/dL — ABNORMAL HIGH (ref 70–99)
Glucose-Capillary: 157 mg/dL — ABNORMAL HIGH (ref 70–99)
Glucose-Capillary: 121 mg/dL — ABNORMAL HIGH (ref 70–99)
Glucose-Capillary: 149 mg/dL — ABNORMAL HIGH (ref 70–99)
Glucose-Capillary: 101 mg/dL — ABNORMAL HIGH (ref 70–99)
Glucose-Capillary: 150 mg/dL — ABNORMAL HIGH (ref 70–99)
Glucose-Capillary: 146 mg/dL — ABNORMAL HIGH (ref 70–99)
Glucose-Capillary: 198 mg/dL — ABNORMAL HIGH (ref 70–99)
Glucose-Capillary: 195 mg/dL — ABNORMAL HIGH (ref 70–99)
Glucose-Capillary: 148 mg/dL — ABNORMAL HIGH (ref 70–99)
Glucose-Capillary: 253 mg/dL — ABNORMAL HIGH (ref 70–99)
Glucose-Capillary: 164 mg/dL — ABNORMAL HIGH (ref 70–99)
Glucose-Capillary: 278 mg/dL — ABNORMAL HIGH (ref 70–99)
Glucose-Capillary: 286 mg/dL — ABNORMAL HIGH (ref 70–99)
Glucose-Capillary: 317 mg/dL — ABNORMAL HIGH (ref 70–99)

## 2020-10-26 LAB — LACTATE DEHYDROGENASE: LDH: 327 U/L — ABNORMAL HIGH (ref 98–192)

## 2020-10-26 LAB — HEPARIN LEVEL (UNFRACTIONATED): Heparin Unfractionated: <0.1 IU/mL — ABNORMAL LOW (ref 0.30–0.70)

## 2020-10-26 LAB — PROTIME-INR
INR: 2 — ABNORMAL HIGH (ref 0.8–1.2)
Prothrombin Time: 21.8 seconds — ABNORMAL HIGH (ref 11.4–15.2)

## 2020-10-26 LAB — COOXEMETRY PANEL
Carboxyhemoglobin: 2 % — ABNORMAL HIGH (ref 0.5–1.5)
Methemoglobin: 1 % (ref 0.0–1.5)
O2 Saturation: 57.8 %
Total hemoglobin: 8.2 g/dL — ABNORMAL LOW (ref 12.0–16.0)

## 2020-10-26 LAB — MAGNESIUM: Magnesium: 2.4 mg/dL (ref 1.7–2.4)

## 2020-10-26 MED ORDER — ALTEPLASE 2 MG IJ SOLR
2.0000 mg | Freq: Once | INTRAMUSCULAR | Status: AC
  Administered 2020-10-26: 2 mg
  Filled 2020-10-26: qty 2

## 2020-10-26 MED ORDER — INSULIN ASPART 100 UNIT/ML ~~LOC~~ SOLN
0.0000 [IU] | SUBCUTANEOUS | Status: DC
  Administered 2020-10-26: 15 [IU] via SUBCUTANEOUS
  Administered 2020-10-26: 11 [IU] via SUBCUTANEOUS
  Administered 2020-10-26: 8.5 [IU] via SUBCUTANEOUS
  Administered 2020-10-27: 7 [IU] via SUBCUTANEOUS
  Administered 2020-10-27 (×2): 4 [IU] via SUBCUTANEOUS
  Administered 2020-10-27 (×3): 7 [IU] via SUBCUTANEOUS
  Administered 2020-10-28 (×2): 4 [IU] via SUBCUTANEOUS
  Administered 2020-10-28: 7 [IU] via SUBCUTANEOUS
  Administered 2020-10-28: 3 [IU] via SUBCUTANEOUS
  Administered 2020-10-29 (×2): 4 [IU] via SUBCUTANEOUS
  Administered 2020-10-29: 0 [IU] via SUBCUTANEOUS
  Administered 2020-10-30 – 2020-10-31 (×2): 3 [IU] via SUBCUTANEOUS

## 2020-10-26 MED ORDER — AMIODARONE HCL 200 MG PO TABS
200.0000 mg | ORAL_TABLET | Freq: Two times a day (BID) | ORAL | Status: DC
  Administered 2020-10-26: 200 mg via ORAL
  Filled 2020-10-26: qty 1

## 2020-10-26 MED ORDER — INSULIN ASPART 100 UNIT/ML ~~LOC~~ SOLN
5.0000 [IU] | Freq: Three times a day (TID) | SUBCUTANEOUS | Status: DC
  Administered 2020-10-26 – 2020-10-27 (×2): 5 [IU] via SUBCUTANEOUS

## 2020-10-26 MED ORDER — AMIODARONE LOAD VIA INFUSION
150.0000 mg | Freq: Once | INTRAVENOUS | Status: AC
  Administered 2020-10-26: 150 mg via INTRAVENOUS
  Filled 2020-10-26: qty 83.34

## 2020-10-26 MED ORDER — INSULIN GLARGINE 100 UNIT/ML ~~LOC~~ SOLN
20.0000 [IU] | Freq: Two times a day (BID) | SUBCUTANEOUS | Status: DC
  Administered 2020-10-26 (×2): 20 [IU] via SUBCUTANEOUS
  Filled 2020-10-26 (×4): qty 0.2

## 2020-10-26 MED ORDER — AMIODARONE HCL IN DEXTROSE 360-4.14 MG/200ML-% IV SOLN
30.0000 mg/h | INTRAVENOUS | Status: DC
  Administered 2020-10-27 – 2020-10-29 (×5): 30 mg/h via INTRAVENOUS
  Filled 2020-10-26 (×6): qty 200

## 2020-10-26 MED ORDER — WARFARIN SODIUM 3 MG PO TABS
3.0000 mg | ORAL_TABLET | Freq: Once | ORAL | Status: AC
  Administered 2020-10-26: 3 mg via ORAL
  Filled 2020-10-26: qty 1

## 2020-10-26 MED ORDER — COLLAGENASE 250 UNIT/GM EX OINT
TOPICAL_OINTMENT | Freq: Every day | CUTANEOUS | Status: DC
  Administered 2020-10-27 – 2020-11-01 (×5): 1 via TOPICAL
  Filled 2020-10-26: qty 30

## 2020-10-26 MED ORDER — AMIODARONE HCL IN DEXTROSE 360-4.14 MG/200ML-% IV SOLN
60.0000 mg/h | INTRAVENOUS | Status: AC
  Administered 2020-10-26: 60 mg/h via INTRAVENOUS
  Filled 2020-10-26: qty 200

## 2020-10-26 NOTE — Progress Notes (Signed)
LVAD Coordinator Rounding Note:  Admitted 09/28/20 due to worsening heart failure symptoms. Recent COVID infection January 2022; did not take medications for 3-4 weeks.   S/p dental extractions 10/07/20 per Dr Benson Norway.  HM III LVAD implanted on 10/13/20 by Dr Cyndia Bent under Destination Therapy criteria.  Pt laying in bed this morning. Reports he is hurting and is tired. PT coming to work with patient later today. Pt dizzy/lightheaded when standing for weight this morning. RAMP echo completed- speed decreased to 5300. See separate note for documentation.   Reports his "butt really hurts"  Sacral wound observed. Currently has Aquacel silver sheet and sacral foam in place. Bedside RN will notify [place] RN to come to assess wound to give recommendations. Continuing turning/repositioning using wedge.    Pt is a 2-assist with movement, requiring steady lift for transfers. Reports continued weakness/numbness of his right hand. He has ball and putty at bedside that he is to work with throughout the day to increase dexterity in his hand. Will need extensive therapy in order to independently perform power source changes/handle VAD equipment.   CRRT stopped to give holiday today. Creatinine 1.68 today.  Spoke with patient's brother [name] on the phone and provided patient update.    Vital signs: Temp: 98.0 HR: 92 Doppler: 78 Auto cuff: 100/78 (85) O2 Sat: 99% on RA Wt: 200.1>221.7>219.5>212.7>223>224.6>217.5>212>196.6>192.4>182.7>174.8>177.2 lbs    LVAD interrogation reveals:  Speed: 5500    (decreased to 5300) Flow: 5.2 Power: 4.1w PI: 2.3 Hematocrit: 27  Alarms: none Events: 20 PI events so far today (100 + PI events yesterday)  Fixed speed: 5500 Low speed limit: 5200  Drive Line: Existing VAD dressing removed and site care performed using sterile technique. Drive line exit site cleaned with Chlora prep applicators x 2, allowed to dry, and gauze dressing with silver strip re-applied. NO skin  prep used-- makes it very difficult to get dressing off, and causing patient to have skin tears. Exit site healing and unincorporated the velour is fully implanted at exit site. There is 1 suture visible that is wrapped around the driveline. Small amount of serousanguineous drainage on gauze dressing. No redness, tenderness, foul odor or rash noted. Few inches of drive line from exit site to modular cable connection, making positioning/anchoring of drive line difficult. Drive line anchor correctly applied. Continue daily dressing changes by VAD coordinator or nurse champion. Next dressing change is due 10/27/20.     Labs:  LDH trend: 444>670>575>571>485>467>541>414>370>361>327  INR trend: 1.4>1.7>1.8>1.7>1.6>1.8>1.9>2.0>2.0  Creatinine: 1.84>3.0>3.4>2.73>2.04>1.70>1.85>1.83>1.86>1.76>1.68  Anticoagulation Plan: -INR Goal: 2.0-2.5 -ASA Dose: 325 mg until INR therapeutic  Blood Products:  IntraOp:   1 Platelet  6 FFP  3 PRBC  1035 cell saver Postop:   10/13/20:   4 FFP  4 PRBC    10/14/20:  1 FFP  1 PRBC   10/16/20:  1 PRBC   10/18/20:  1 PRBC   10/16/20:  1 PRBC   10/18/20:  1 PRBC  Device: - Medtronic single -Therapies: off   Arrythmias: VT in OR requiring defibrillation   Renal: CRRT started 10/19/20 -- stopped 10/26/20   Drips: Epinephrine 3 mcg/min Milrinone 0.125 mcg/kg/min -- off  Amiodarone 30 mg/hr -- off; transitioned to PO  Patient Education: 1. No family at bedside this morning. Pt states he is tired and in pain; teaching not appropriate at this time.   Plan/Recommendations:  1. Call VAD coordinator for equipment or drive line issues 2. Daily drive line dressing change per VAD coordinator or nurse champion.  Next dressing change due 10/26/20  Emerson Monte RN [place] Coordinator  Office: [phone]  24/7 Pager: [phone]

## 2020-10-26 NOTE — Progress Notes (Signed)
Marcus Johnson NEPHROLOGY PROGRESS NOTE  Assessment/ Plan:  Acute kidney injury on chronic kidney disease stage IIIa: With underlying chronic kidney disease likely from diabetes/hypertension and worsening renal function from hemodynamically mediated ATN versus cardiorenal syndrome.   -hold CVVHD for today (low threshold to have him back on) to assess for any recovery. This is a good time to do it since he is relatively dry. Hopefully his pressor requirements also improve. My concern is attempting intermittent HD as UF would not be possible given his current BP/pressor requirements. If there is no renal recovery or improvement in BP, then would most likely need to go back on CRRT -Heparin thru circuit  Acute exacerbation of chronic systolic heart failure with cardiogenic shock: status post LVAD. Remains on pressors with epinephrine, milrinone.  Volume status/UF managed with CRRT, kept net even. Holding CRRT as above. Repeat echo  Anemia:Likely secondary to chronic disease and exacerbated by recent surgical losses.  Transfuse PRBC as needed.  Atrial fibrillation: Intermittently tachycardic, continue amiodarone and on Coumadin   Hypophosphatemia: Repleted IV phosphate, requiring daily.  Follow-up lab result.  Subjective: Seen and examined in ICU.  No acute events. No complaints. Repeat echo today. Planning on holding CRRT to give him a break and to assess for renal recovery. Net neg only 240cc. Objective Vital signs in last 24 hours: Vitals:   10/26/20 0700 10/26/20 0800 10/26/20 0900 10/26/20 1000  BP:      Pulse:  92    Resp:  18 17 (!) 26  Temp: 98 F (36.7 C)     TempSrc: Oral     SpO2:      Weight:      Height:       Weight change: 0.1 kg  Intake/Output Summary (Last 24 hours) at 10/26/2020 1122 Last data filed at 10/26/2020 1000 Gross per 24 hour  Intake 2738.63 ml  Output 2823 ml  Net -84.37 ml       Labs: Basic Metabolic Panel: Recent Labs  Lab  10/25/20 0537 10/25/20 1509 10/25/20 1521 10/25/20 1754 10/26/20 0418  NA 132* 114*  --  133* 131*  K 4.3 >7.5*  --  4.7 4.7  CL 99 83*  --  98 97*  CO2 24 20*  --  25 25  GLUCOSE 241* 860* 884* 160* 146*  BUN 25* 21*  --  25* 24*  CREATININE 1.76* 1.77*  --  2.01* 1.68*  CALCIUM 8.3* 6.9*  --  8.6* 8.5*  PHOS 1.9* >30.0*  --   --  2.0*   Liver Function Tests: Recent Labs  Lab 10/25/20 0537 10/25/20 1509 10/26/20 0418  ALBUMIN 2.3* 2.0* 2.3*   No results for input(s): LIPASE, AMYLASE in the last 168 hours. No results for input(s): AMMONIA in the last 168 hours. CBC: Recent Labs  Lab 10/20/20 0310 10/21/20 0930 10/23/20 0454 10/24/20 0432 10/25/20 0537 10/26/20 0418 10/26/20 1000  WBC 13.8*   < > 15.4* 16.2* 15.7* 16.8* 17.3*  NEUTROABS 11.3*  --   --   --   --   --   --   HGB 8.1*   < > 8.5* 8.5* 8.3* 8.1* 8.0*  HCT 25.3*   < > 27.1* 27.6* 26.2* 26.7* 25.9*  MCV 89.4   < > 89.4 90.5 89.7 92.1 90.6  PLT 255   < > 377 393 421* 396 370   < > = values in this interval not displayed.   Cardiac Enzymes: No results for input(s): CKTOTAL,  CKMB, CKMBINDEX, TROPONINI in the last 168 hours. CBG: Recent Labs  Lab 10/26/20 0708 10/26/20 0805 10/26/20 0912 10/26/20 1008 10/26/20 1119  GLUCAP 146* 198* 195* 253* 148*    Iron Studies: No results for input(s): IRON, TIBC, TRANSFERRIN, FERRITIN in the last 72 hours. Studies/Results: No results found.  Medications: Infusions: . epinephrine 3 mcg/min (10/26/20 1000)  . insulin 3.6 mL/hr at 10/26/20 1000  . lactated ringers    . lactated ringers Stopped (10/17/20 1655)  . prismasol BGK 2/2.5 replacement solution 200 mL/hr at 10/25/20 0751  . prismasol BGK 2/2.5 replacement solution 500 mL/hr at 10/25/20 1646  . prismasol BGK 4/2.5 1,500 mL/hr at 10/26/20 0710    Scheduled Medications: . amiodarone  200 mg Oral BID  . aspirin EC  81 mg Oral Daily  . B-complex with vitamin C  1 tablet Oral Daily  . Chlorhexidine  Gluconate Cloth  6 each Topical Daily  . feeding supplement  237 mL Oral TID BM  . gabapentin  100 mg Oral QHS  . lidocaine  1 patch Transdermal Q24H  . mouth rinse  15 mL Mouth Rinse BID  . metoCLOPramide (REGLAN) injection  5 mg Intravenous Q12H  . midodrine  15 mg Oral TID WC  . pantoprazole  40 mg Oral Daily  . polyethylene glycol  17 g Oral Daily  . senna-docusate  2 tablet Oral BID  . sodium chloride flush  10-40 mL Intracatheter Q12H  . sorbitol  30 mL Oral Daily  . Warfarin - Pharmacist Dosing Inpatient   Does not apply q1600    have reviewed scheduled and prn medications.  Physical Exam: General: NAD, sitting up in bed Heart: Mechanical heart sound/lvad hum Lungs: cta bl Abdomen:soft, Non-tender, non-distended Extremities: no edema Dialysis Access: Temporary RIJ HD catheter.  Marcus Johnson 10/26/2020,11:22 AM  LOS: 28 days

## 2020-10-26 NOTE — Progress Notes (Addendum)
Patient ID: Marcus Johnson., male   DOB: Feb 13, 1961, 60 y.o.   MRN: [id number]     Advanced Heart Failure Rounding Note  PCP-Cardiologist: No primary care provider on file.   Subjective:    - 2/27 Milrinone switched to dobutamine 5 mcg.  - 2/28 Moved to ICU. Dobutamine increased to 7.5 mcg and Norepi 3 mcg added. Diuretics held.  - 3/1 Impella 5.5 placed.  - 3/4 Teeth removed - 3/10 HM3 LVAD placed -3/13 VAD speed increased to 5500. [name] out  - 3/14 Nephrology consulted. Given 1UPRBCs  - 3/15 Started CRRT. Given 1UPRBCs.   He continues on epinephrine 3, milrinone 0.125, amiodarone 30.  Co-ox 58%.   Remains on CRRT, has been running even.  CVP 9-10 on my read this morning.  INR up to 2. LDH 327.   Ate all his breakfast, complains of pain from developing sacral decubitis.  Up in chair for 2 hrs yesterday.   Echo: EF 20-25%, normal RV, PASP 61, dilated IVC.   LVAD Interrogation HM 3: Speed: 5500 Flow: 5.4   PI: 2.7   Power: 4.2.  15 PI events this morning when he stood up.   VAD interrogated personally.    Objective:   Weight Range: 80.4 kg Body mass index is 25.07 kg/m.   Vital Signs:   Temp:  [97.6 F (36.4 C)-99.1 F (37.3 C)] 98.5 F (36.9 C) (03/23 0000) Pulse Rate:  [46-101] 90 (03/23 0300) Resp:  [11-25] 20 (03/23 0641) SpO2:  [95 %-100 %] 99 % (03/23 0430) Weight:  [80.4 kg] 80.4 kg (03/23 0641) Last BM Date: 10/21/20  Weight change: Filed Weights   10/24/20 1000 10/25/20 0600 10/26/20 0641  Weight: 80.3 kg 79.3 kg 80.4 kg    Intake/Output:   Intake/Output Summary (Last 24 hours) at 10/26/2020 0801 Last data filed at 10/26/2020 0700 Gross per 24 hour  Intake 2586.97 ml  Output 3249 ml  Net -662.03 ml      Physical Exam   CVP 9-10  General: Well appearing this am. NAD.  HEENT: Normal. Neck: Supple, JVP 10 cm. Carotids OK.  Cardiac:  Mechanical heart sounds with LVAD hum present.  Lungs:  CTAB, normal effort.  Abdomen:  NT, ND, no HSM.  No bruits or masses. +BS  LVAD exit site: Well-healed and incorporated. Dressing dry and intact. No erythema or drainage. Stabilization device present and accurately applied. Driveline dressing changed daily per sterile technique. Extremities:  Warm and dry. No cyanosis, clubbing, rash, or edema.  Neuro:  Alert & oriented x 3. Cranial nerves grossly intact. Moves all 4 extremities w/o difficulty. Affect pleasant     Telemetry   NSR 90s  Personally reviewed   Labs    CBC Recent Labs    10/25/20 0537 10/26/20 0418  WBC 15.7* 16.8*  HGB 8.3* 8.1*  HCT 26.2* 26.7*  MCV 89.7 92.1  PLT 421* 426   Basic Metabolic Panel Recent Labs    10/25/20 0537 10/25/20 1509 10/25/20 1521 10/25/20 1754 10/26/20 0418  NA 132* 114*  --  133* 131*  K 4.3 >7.5*  --  4.7 4.7  CL 99 83*  --  98 97*  CO2 24 20*  --  25 25  GLUCOSE 241* 860*   < > 160* 146*  BUN 25* 21*  --  25* 24*  CREATININE 1.76* 1.77*  --  2.01* 1.68*  CALCIUM 8.3* 6.9*  --  8.6* 8.5*  MG 2.5*  --   --   --  2.4  PHOS 1.9* >30.0*  --   --  2.0*   < > = values in this interval not displayed.   Liver Function Tests Recent Labs    10/25/20 1509 10/26/20 0418  ALBUMIN 2.0* 2.3*   No results for input(s): LIPASE, AMYLASE in the last 72 hours. Cardiac Enzymes No results for input(s): CKTOTAL, CKMB, CKMBINDEX, TROPONINI in the last 72 hours.  BNP: BNP (last 3 results) Recent Labs    10/14/20 0254 10/20/20 0305  BNP 1,097.8* 466.7*    ProBNP (last 3 results) No results for input(s): PROBNP in the last 8760 hours.   D-Dimer No results for input(s): DDIMER in the last 72 hours. Hemoglobin A1C No results for input(s): HGBA1C in the last 72 hours. Fasting Lipid Panel No results for input(s): CHOL, HDL, LDLCALC, TRIG, CHOLHDL, LDLDIRECT in the last 72 hours. Thyroid Function Tests No results for input(s): TSH, T4TOTAL, T3FREE, THYROIDAB in the last 72 hours.  Invalid input(s): FREET3  Other  results:   Imaging    No results found.   Medications:     Scheduled Medications: . amiodarone  200 mg Oral BID  . aspirin EC  81 mg Oral Daily  . B-complex with vitamin C  1 tablet Oral Daily  . Chlorhexidine Gluconate Cloth  6 each Topical Daily  . feeding supplement  237 mL Oral TID BM  . gabapentin  100 mg Oral QHS  . lidocaine  1 patch Transdermal Q24H  . mouth rinse  15 mL Mouth Rinse BID  . metoCLOPramide (REGLAN) injection  5 mg Intravenous Q12H  . midodrine  15 mg Oral TID WC  . pantoprazole  40 mg Oral Daily  . polyethylene glycol  17 g Oral Daily  . senna-docusate  2 tablet Oral BID  . sodium chloride flush  10-40 mL Intracatheter Q12H  . sorbitol  30 mL Oral Daily  . Warfarin - Pharmacist Dosing Inpatient   Does not apply q1600    Infusions: . epinephrine 3 mcg/min (10/26/20 0700)  . insulin 1.8 mL/hr at 10/26/20 0700  . lactated ringers    . lactated ringers Stopped (10/17/20 1655)  . prismasol BGK 2/2.5 replacement solution 200 mL/hr at 10/25/20 0751  . prismasol BGK 2/2.5 replacement solution 500 mL/hr at 10/25/20 1646  . prismasol BGK 4/2.5 1,500 mL/hr at 10/26/20 0710    PRN Medications: dextrose, dextrose, heparin, heparin, morphine injection, ondansetron (ZOFRAN) IV, oxyCODONE, sodium chloride flush, traMADol, white petrolatum    Assessment/Plan   1.Acute on chronic systolic HF -> cardiogenic shock:ICM.HasMedtronic ICD. EF 15%. Low output persisted despite milrinone and then dobutamine with rise in creatinine to 2.5. Impella 5.5 placed 3/1.  Creatinine improved to 1.7 and HM3 LVAD was placed on 3/10. Developed post-op renal failure and now on CVVH.  Patient currently on milrinone 0.125 + epi 3, co-ox 58%.  CVP 9-10. Weight down to pre-op or below.  - Stop CVVH today, give holiday to assess renal response (any UOP?).  - Keep epinephrine at 3 today, stop milrinone.   - Continue midodrine 15 mg tid.  - Echo today for RV function and LVAD =>  left-shifted septum, speed decreased to 5300 rpm today.  2. LVAD: 3/13 VAD speed increased to 5500.  VAD interrogated personally. Parameters stable.  LDH stable at 327.  - INR 2.0 today on warfarin, on ASA 81 daily. - DL site ok 3. CAD s/p CABG2002:Last cath in 6/19 with patent grafts.  - Continue Crestor 40 mg daily.  4. AKI on CKD Stage 3b: Suspect that this is a combination of cardiorenal syndrome and diabetic nephropathy and possibly ATN. Developed post-op AKI. Started on CRRT 3/15.  - Nephrology appreciated. Hold CVVH today and assess renal response.   5. Atrial flutter/fibrillation: s/p DC-CV 08/21/18. NSR this morning.  - Transition amiodarone to po.  6. Wound R Foot: Partial thickness skin loss noted. Cover with mepilex border and change every 5 days.  7. Right arm pain: Think neuropathic, per Dr. Cyndia Bent patient had stretch of brachial plexus with Impella placement.  8. ID:  Afebrile, negative cultures. Follow closely  9. IR:WERXVQM of very poor control but hgbA1ctrending down. Most recent 7.7%. Glucose very high yesterday, insulin gtt started.  - Consulted diabetes coordinator.  10. Anemia: hgb stable at 8.1 11. Deconditioning - PT/OT following  - will need CIR once medically stable  12. Stage 2 sacral decubitus ulcer: Wound care.   CRITICAL CARE Performed by: Loralie Champagne  Total critical care time: 40 minutes  Critical care time was exclusive of separately billable procedures and treating other patients.  Critical care was necessary to treat or prevent imminent or life-threatening deterioration.  Critical care was time spent personally by me on the following activities: development of treatment plan with patient and/or surrogate as well as nursing, discussions with consultants, evaluation of patient's response to treatment, examination of patient, obtaining history from patient or surrogate, ordering and performing treatments and interventions, ordering and review of  laboratory studies, ordering and review of radiographic studies, pulse oximetry and re-evaluation of patient's condition.    Length of Stay: [place], [place]  10/26/2020, 8:01 AM  Advanced Heart Failure Team Pager [phone] (M-F; 7a - 4p)  Please contact [facility] for night-coverage after hours (4p -7a ) and weekends on [email or web address]

## 2020-10-26 NOTE — Progress Notes (Signed)
CSW met with patient at bedside to offer support. Patient was receiving PT and was actively engaged in conversation. CSW spoke briefly and will return tomorrow to provide supportive intervention. Raquel Sarna, [place], [place]

## 2020-10-26 NOTE — Progress Notes (Signed)
Inpatient Diabetes Program Recommendations  AACE/ADA: New Consensus Statement on Inpatient Glycemic Control  Target Ranges:  Prepandial:   less than 140 mg/dL      Peak postprandial:   less than 180 mg/dL (1-2 hours)      Critically ill patients:  140 - 180 mg/dL   Results for Marcus Johnson, Marcus Johnson (MRN [id number]) as of 10/26/2020 11:52  Ref. Range 10/26/2020 07:08 10/26/2020 08:05 10/26/2020 09:12 10/26/2020 10:08 10/26/2020 11:19  Glucose-Capillary Latest Ref Range: 70 - 99 mg/dL 146 (H) 198 (H) 195 (H) 253 (H) 148 (H)  Results for Marcus Johnson, Marcus Johnson (MRN [id number]) as of 10/26/2020 11:52  Ref. Range 10/25/2020 06:45 10/25/2020 11:31 10/25/2020 15:19 10/25/2020 17:11 10/25/2020 17:53 10/25/2020 17:57 10/25/2020 19:03 10/25/2020 20:01 10/25/2020 21:06 10/25/2020 22:03 10/25/2020 23:05  Glucose-Capillary Latest Ref Range: 70 - 99 mg/dL 234 (H) 311 (H) >600 (HH) >600 (HH) 159 (H) 177 (H) 158 (H) 248 (H) 175 (H) 219 (H) 159 (H)  Results for Marcus Johnson, Marcus Johnson (MRN [id number]) as of 10/26/2020 11:52  Ref. Range 10/12/2020 18:27  Hemoglobin A1C Latest Ref Range: 4.8 - 5.6 % 7.8 (H)   Review of Glycemic Control  Diabetes history: DM2 Outpatient Diabetes medications: Jardiance 10 mg daily, Novolog 5 units TID with meals, Lantus 50 units daily Current orders for Inpatient glycemic control: IV insulin  Inpatient Diabetes Program Recommendations:    Insulin: Once provider is ready to transition from IV to SQ insulin, please consider ordering Lantus 20 units BID, Novolog 5 units TID with meals for meal coverage if patient eats at least 50% of meals, CBGS Q4H, and Novolog 0-20 units Q4H.  Thanks, Barnie Alderman, RN, MSN, CDE Diabetes Coordinator Inpatient Diabetes Program [phone] (Team Pager from 8am to 5pm)

## 2020-10-26 NOTE — Progress Notes (Signed)
  Echocardiogram 2D Echocardiogram has been performed.  Marcus Johnson 10/26/2020, 9:59 AM

## 2020-10-26 NOTE — Progress Notes (Signed)
Physical Therapy Treatment Patient Details Name: Marcus Johnson. MRN: [id number] DOB: 1961-01-28 Today's Date: 10/26/2020    History of Present Illness 60 y.o. male admitted 2/28 with ICM for LVAD workup. Impella placed 3/1. Heartmate 3 LVAD placed 3/10. CRRT started 3/15. PMHx: DM2, CAD s/p CABG 4008, systolic HF due to ischemic cardiomyopathy with EF 20-25% (echo 12/15), DM2 and CKD. He is s/p Medtronic single chamber ICD. Covid 1/22.  CRRT discontinued 3/23    PT Comments    Progressing slowly toward goals.  Emphasis on progressing gait in a confined area with EVA walker, sit to stands, pivots to chair and work on switch over to batteries from controller.    Follow Up Recommendations  Supervision/Assistance - 24 hour;CIR     Equipment Recommendations  Other (comment) (TBA next venue)    Recommendations for Other Services       Precautions / Restrictions Precautions Precautions: Fall;Sternal Precaution Booklet Issued: Yes (comment) Precaution Comments: LVAD, presyncope,  CRRT held 3/23    Mobility  Bed Mobility Overal bed mobility: Needs Assistance Bed Mobility: Supine to Sit     Supine to sit: Mod assist     General bed mobility comments: assisted pt to build momentum and sit straight up.    Transfers Overall transfer level: Needs assistance Equipment used:  (EVA) Transfers: Sit to/from Stand Sit to Stand: Min assist;+2 physical assistance;+2 safety/equipment         General transfer comment: cues for sternal precautions, min forward and boost into EVA  Ambulation/Gait Ambulation/Gait assistance: Min assist;+2 safety/equipment Gait Distance (Feet): 6 Feet (forward and back x4 and walking pivot 4 feet to chair.) Assistive device:  (EVA) Gait Pattern/deviations: Step-through pattern;Step-to pattern   Gait velocity interpretation: <1.31 ft/sec, indicative of household ambulator General Gait Details: low amplitude steps with wide BOS, relatively  uncoordinated, mildly unsteady.   Stairs             Wheelchair Mobility    Modified Rankin (Stroke Patients Only)       Balance   Sitting-balance support: No upper extremity supported;Feet supported Sitting balance-Leahy Scale: Fair Sitting balance - Comments: Due to buttock pain, pt had dificulty scooting/pivoting to EOB.   Standing balance support: Bilateral upper extremity supported Standing balance-Leahy Scale: Poor Standing balance comment: UE support without need for external support with use of EVA walker                            Cognition Arousal/Alertness: Awake/alert Behavior During Therapy: Flat affect Overall Cognitive Status: Impaired/Different from baseline                               Problem Solving: Slow processing General Comments: pt with improved memory and awareness as well as command following this session. Pt recalling staff names and 2/4 sternal precautions. able to state name and purpose of controller. Educated for initial battery transition with pt unable to recall steps immediately after instruction and requiring max assist for power transition due to lack of function of RUE and cognition      Exercises Other Exercises Other Exercises: hip/knee flexion/ext, graded resistance gross extension Other Exercises: practiced connecting battery to clips and switch over from console controller to driveline controller.    General Comments General comments (skin integrity, edema, etc.): vss RA, BP 80's over 60's in chair.  No dizziness  Pertinent Vitals/Pain Pain Assessment: Faces Pain Score: 6  Pain Location: sacrum in sitting Pain Descriptors / Indicators: Discomfort;Grimacing Pain Intervention(s): Monitored during session    Home Living                      Prior Function            PT Goals (current goals can now be found in the care plan section) Acute Rehab PT Goals Patient Stated Goal: to go  home and be with his grandchildren PT Goal Formulation: With patient Time For Goal Achievement: 10/31/20 Potential to Achieve Goals: Fair Progress towards PT goals: Progressing toward goals    Frequency    Min 4X/week      PT Plan Current plan remains appropriate    Co-evaluation              AM-PAC PT "6 Clicks" Mobility   Outcome Measure  Help needed turning from your back to your side while in a flat bed without using bedrails?: A Little Help needed moving from lying on your back to sitting on the side of a flat bed without using bedrails?: A Lot Help needed moving to and from a bed to a chair (including a wheelchair)?: A Lot Help needed standing up from a chair using your arms (e.g., wheelchair or bedside chair)?: A Little Help needed to walk in hospital room?: A Little Help needed climbing 3-5 steps with a railing? : A Lot 6 Click Score: 15    End of Session   Activity Tolerance: Patient tolerated treatment well;Patient limited by fatigue Patient left: in chair;with call bell/phone within reach Nurse Communication: Precautions;Mobility status PT Visit Diagnosis: Other abnormalities of gait and mobility (R26.89);Muscle weakness (generalized) (M62.81);Difficulty in walking, not elsewhere classified (R26.2);Pain Pain - part of body:  (sacral wound)     Time: 9563-8756 PT Time Calculation (min) (ACUTE ONLY): 38 min  Charges:  $Gait Training: 8-22 mins $Therapeutic Activity: 23-37 mins                     10/26/2020  Ginger Carne., PT Acute Rehabilitation Services [phone]  (pager) [phone]  (office)   Tessie Fass [place] 10/26/2020, 3:36 PM

## 2020-10-26 NOTE — Consult Note (Signed)
[place] Nurse Consult Note: Patient receiving care in [facility] 2H17. Primary RN, Nicki Reaper, present at time of assessment. Reason for Consult: enlarged sacral wound Wound type: Former MASD now unstageable Pressure Injury POA: No Measurement: 6.5 cm x 8 cm x unknown depth Wound bed: approximately 40% pink, remainder yellow/brown slough Drainage (amount, consistency, odor) none  Periwound: intact Dressing procedure/placement/frequency: Apply Santyl to the sacral wound in a nickel thick layer. Cover with a saline moistened gauze, then foam dressing.  Change daily. Val Riles, RN, MSN, CWOCN, CNS-BC, pager [phone]

## 2020-10-26 NOTE — Progress Notes (Signed)
Was called to bedside regarding increased HR sustaining in the 120s. Back in Afib. Pt mildly diaphoretic but no dyspnea or CP. MAPs in the mid 84s. On Epi 3. Milrinone discontinued this am.     Restart Amiodarone gtt at 60/hr w/ bolus 150 x 1. Can titrate Epi if needed for BP support.   Lyda Jester, PA-C

## 2020-10-26 NOTE — Progress Notes (Signed)
Speed  Flow  PI  Power  LVIDD  AI  Aortic openings  MR  TR  Septum  RV   5500  5.2 2.3 4.1 2.9 trivial 0/5  none mild- moderate Pulling left moderately enlarged. Mildly down   5400  5.1 2.5 4.0 3.5 trivial 1/20 none Mild- moderate Slight bow left Moderately enlarged. Mildly down   5300 4.9 2.7 3.8 3.65  1/20 none Mild- moderate Slight bow left, more towards midline Moderately enlarged.  Mildly down                                          Doppler MAP: 78   Ramp ECHO performed at bedside.  At completion of ramp study, patients primary and back up controller programmed:  Fixed speed: 5300 Low speed limit: [place] RN [place] Coordinator  Office: [phone]  24/7 Pager: [phone]

## 2020-10-26 NOTE — Progress Notes (Signed)
Patient was able to stand up on scale but could not stand up long enough for chair to be placed behind him to sit. Patient got dizzy and was placed back in bed

## 2020-10-26 NOTE — Progress Notes (Signed)
Occupational Therapy Treatment Patient Details Name: Marcus Johnson. MRN: [id number] DOB: 15-Jul-1961 Today's Date: 10/26/2020    History of present illness 60 y.o. male admitted 2/28 with ICM for LVAD workup. Impella placed 3/1. Heartmate 3 LVAD placed 3/10. CRRT started 3/15. PMHx: DM2, CAD s/p CABG 0254, systolic HF due to ischemic cardiomyopathy with EF 20-25% (echo 12/15), DM2 and CKD. He is s/p Medtronic single chamber ICD. Covid 1/22.  CRRT discontinued 3/23   OT comments  "I'm exhausted" but agreeable to work on RUE exercise/activities to increase functional use of RUE. Proximal strength appears improved although pt with apparent capsular tightness limiting ER R shoulder. Continues to demonstrate sensorimotor deficits distally with significant difficulty with wrist flexion, pronation and digit flexion and opposition. Thumb strap used to pull thumb into opposition and index finger buddy taped to middle finger to increase functional pinch strength. Focus on using R hand as functional assist while using L hand to unscrew lid to simulate working with connectors. Pt appreciative. Will continue to follow acutely.   Follow Up Recommendations  CIR;Supervision/Assistance - 24 hour    Equipment Recommendations  Other (comment) (TBA)    Recommendations for Other Services Rehab consult    Precautions / Restrictions Precautions Precautions: Fall;Sternal Precaution Booklet Issued: Yes (comment) Precaution Comments: LVAD, presyncope,  CRRT held 3/23       Mobility   Balance    ADL either performed or assessed with clinical judgement   ADL Overall ADL's : Needs assistance/impaired  focus of session on RUE neuro rehab                                           Vision       Perception     Praxis      Cognition Arousal/Alertness: Awake/alert Behavior During Therapy: Flat affect Overall Cognitive Status: Impaired/Different from baseline Area of  Impairment: Memory                             Problem Solving: Slow processing General Comments: Pt extremely fatigued; remembered this therapist from session Monday        Exercises Exercises: General Upper Extremity;Other exercises;General Lower Extremity General Exercises - Upper Extremity Shoulder Flexion: AROM;AAROM;Right;10 reps (limited to 90 FF) Shoulder ABduction: AAROM;AROM;Right;5 reps Elbow Flexion: AROM;10 reps;Supine Elbow Extension: AROM;Right;10 reps (against gravity with 1 rest session) Wrist Flexion: AAROM;AROM;10 reps;Seated;Supine Digit Composite Flexion: AROM;AAROM;10 reps;Supine Other Exercises Other Exercises: pinch/grip strengthening x 10 Other Exercises: thumb strap used to increase opposition; index finger buddy taped to middle finger to improve functional position for pinch. pt worked on using R hand as functional assist while unscrewing top to simulate connector   Shoulder Instructions       General Comments vss RA, BP 80's over 60's in chair.  No dizziness    Pertinent Vitals/ Pain       Pain Assessment: Faces Pain Score: 6  Faces Pain Scale: Hurts little more Pain Location: R shoulder with ROM; general discomfort Pain Descriptors / Indicators: Discomfort;Grimacing Pain Intervention(s): Limited activity within patient's tolerance;Repositioned  Home Living                                          Prior Functioning/Environment  Frequency  Min 2X/week        Progress Toward Goals  OT Goals(current goals can now be found in the care plan section)  Progress towards OT goals: Progressing toward goals  Acute Rehab OT Goals Patient Stated Goal: to go home and be with his grandchildren OT Goal Formulation: With patient Time For Goal Achievement: 10/31/20 Potential to Achieve Goals: Good ADL Goals Pt Will Perform Eating: with set-up;with adaptive utensils Pt Will Perform Grooming: with  set-up;sitting;with adaptive equipment Pt Will Transfer to Toilet: with min guard assist;ambulating Pt/caregiver will Perform Home Exercise Program: Increased ROM;Increased strength;Both right and left upper extremity;With minimal assist;With written HEP provided Additional ADL Goal #1: Pt will be able to recall sternal precautions Additional ADL Goal #2: Pt will be able to change out his lines from battery<>wall with min A  Plan Discharge plan remains appropriate    Co-evaluation                 AM-PAC OT "6 Clicks" Daily Activity     Outcome Measure   Help from another person eating meals?: A Little Help from another person taking care of personal grooming?: A Lot Help from another person toileting, which includes using toliet, bedpan, or urinal?: Total Help from another person bathing (including washing, rinsing, drying)?: Total Help from another person to put on and taking off regular upper body clothing?: Total Help from another person to put on and taking off regular lower body clothing?: Total 6 Click Score: 9    End of Session    OT Visit Diagnosis: Unsteadiness on feet (R26.81);Other abnormalities of gait and mobility (R26.89);Muscle weakness (generalized) (M62.81);Other symptoms and signs involving cognitive function;Dizziness and giddiness (R42) Pain - Right/Left: Right Pain - part of body: Arm   Activity Tolerance Patient limited by fatigue   Patient Left in bed;with call bell/phone within reach;with nursing/sitter in room   Nurse Communication Other (comment) (encourage use of RUE)        Time: 2233-6122 OT Time Calculation (min): 26 min  Charges: OT General Charges $OT Visit: 1 Visit OT Treatments $Neuromuscular Re-education: 23-37 mins  Maurie Boettcher, OT/L   Acute OT Clinical Specialist [place] Pager [phone] Office [phone]    [facility] 10/26/2020, 5:04 PM

## 2020-10-26 NOTE — Progress Notes (Signed)
ANTICOAGULATION CONSULT NOTE - Follow Up Consult  Pharmacy Consult for Warfarin Indication: LVAD  Allergies  Allergen Reactions  . Meclizine Hcl Anaphylaxis and Swelling  . Ivabradine Nausea Only    Patient Measurements: Height: 5' 10.5" (179.1 cm) Weight: 80.4 kg (177 lb 4 oz) IBW/kg (Calculated) : 74.15 Heparin Dosing Weight: 89.2 kg  Vital Signs: Temp: 98.5 F (36.9 C) (03/23 0000) Temp Source: Oral (03/23 0000) Pulse Rate: 90 (03/23 0300)  Labs: Recent Labs    10/24/20 0432 10/24/20 1556 10/25/20 0537 10/25/20 1509 10/25/20 1754 10/26/20 0418  HGB 8.5*  --  8.3*  --   --  8.1*  HCT 27.6*  --  26.2*  --   --  26.7*  PLT 393  --  421*  --   --  396  LABPROT 21.5*  --  22.1*  --   --  21.8*  INR 1.9*  --  2.0*  --   --  2.0*  HEPARINUNFRC  --   --  <0.10*  --   --  <0.10*  CREATININE 1.86*   < > 1.76* 1.77* 2.01* 1.68*   < > = values in this interval not displayed.    Estimated Creatinine Clearance: 49.7 mL/min (A) (by C-G formula based on SCr of 1.68 mg/dL (H)).   Medical History: Past Medical History:  Diagnosis Date  . AICD (automatic cardioverter/defibrillator) present    Single-chamber  implantable cardiac defibrillator - Medtronic  . Atrial fibrillation ([place])   . Cataract    Mixed form OU  . CHF (congestive heart failure) ([place])   . Chronic kidney disease   . Chronic kidney disease (CKD), stage III (moderate) (HCC)   . Chronic systolic heart failure ([place])    a. Echo 5/13: Mild LVE, mild LVH, EF 10%, anteroseptal, lateral, apical AK, mild MR, mild LAE, moderately reduced RVSF, mild RAE, PASP 60;  b. 07/2014 Echo: EF 20-2%, diff HK, AKI of antsep/apical/mid-apicalinferior, mod reduced RV.  Marland Kitchen Coronary artery disease    a. s/p CABG 2002 b. LHC 5/13:  dLM 80%, LAD subtotally occluded, pCFX occluded, pRCA 50%, mid? Occlusion with high take off of the PDA with 70% multiple lesions-not bypassed and supplies collaterals to LAD, LIMA-IM/ramus ok, S-OM ok, S-PLA  branches ok. Medical therapy was recommended  . COVID   . Diabetic retinopathy (HCC)    NPDR OD, PDR OS  . Dyspnea   . Gout    "on daily RX" (01/08/2018)  . Hypertension   . Hypertensive retinopathy    OU  . Ischemic cardiomyopathy    a. Echo 5/13: Mild LVE, mild LVH, EF 10%, anteroseptal, lateral, apical AK, mild MR, mild LAE, moderately reduced RVSF, mild RAE, PASP 60;  b. 01/2012 s/p MDT [id number] Protecta XT VR AICD;  c. 07/2014 Echo: EF 20-2%, diff HK, AKI of antsep/apical/mid-apicalinferior, mod reduced RV.  Marland Kitchen MRSA (methicillin resistant Staphylococcus aureus)    Status post right foot plantar deep infection with MRSA status post  I&D 10/2008  . Myocardial infarction [facility])    "was told I'd had several before heart OR in 2002" (01/08/2018)  . Noncompliance   . Peripheral neuropathy   . Retinopathy, diabetic, background ([place])   . Syncope   . Type II diabetes mellitus ([place])    requiring insulin   . Vitreous hemorrhage, left (HCC)    and proliferative diabetic retinopathy    Assessment: 60 yo M s/p new LVAD implant with HeartMate III on 3/10. Warfarin started per MD on  3/12, and pharmacy asked to take over warfarin dosing.   INR remains at 2 today. Currently taking ~2 Ensures daily and eating very well. CBC stable. No overt bleeding. LDH stable 300s. Will increase dose very slightly to maintain therapeutic INR.  Goal of Therapy:  INR 2-2.5 Monitor platelets by anticoagulation protocol: Yes   Plan:  Warfarin 3 mg PO tonight  Daily PT/INR, CBC, monitor s/sx bleeding  [name], PharmD, BCPS PGY2 Cardiology Pharmacy Resident Phone: [phone] 10/26/2020  8:10 AM  Please check [email or web address] for unit-specific pharmacy phone numbers.

## 2020-10-27 ENCOUNTER — Inpatient Hospital Stay (HOSPITAL_COMMUNITY): Payer: Medicare HMO

## 2020-10-27 DIAGNOSIS — Z95811 Presence of heart assist device: Secondary | ICD-10-CM | POA: Diagnosis not present

## 2020-10-27 DIAGNOSIS — I5023 Acute on chronic systolic (congestive) heart failure: Secondary | ICD-10-CM | POA: Diagnosis not present

## 2020-10-27 LAB — BRAIN NATRIURETIC PEPTIDE: B Natriuretic Peptide: 774.9 pg/mL — ABNORMAL HIGH (ref 0.0–100.0)

## 2020-10-27 LAB — RENAL FUNCTION PANEL
Albumin: 2.2 g/dL — ABNORMAL LOW (ref 3.5–5.0)
Albumin: 2.3 g/dL — ABNORMAL LOW (ref 3.5–5.0)
Anion gap: 9 (ref 5–15)
Anion gap: 10 (ref 5–15)
BUN: 43 mg/dL — ABNORMAL HIGH (ref 6–20)
BUN: 47 mg/dL — ABNORMAL HIGH (ref 6–20)
CO2: 25 mmol/L (ref 22–32)
CO2: 24 mmol/L (ref 22–32)
Calcium: 8.7 mg/dL — ABNORMAL LOW (ref 8.9–10.3)
Calcium: 8.7 mg/dL — ABNORMAL LOW (ref 8.9–10.3)
Chloride: 95 mmol/L — ABNORMAL LOW (ref 98–111)
Chloride: 97 mmol/L — ABNORMAL LOW (ref 98–111)
Creatinine, Ser: 3.53 mg/dL — ABNORMAL HIGH (ref 0.61–1.24)
Creatinine, Ser: 3.78 mg/dL — ABNORMAL HIGH (ref 0.61–1.24)
GFR, Estimated: 19 mL/min — ABNORMAL LOW (ref >60)
GFR, Estimated: 18 mL/min — ABNORMAL LOW (ref >60)
Glucose, Bld: 259 mg/dL — ABNORMAL HIGH (ref 70–99)
Glucose, Bld: 209 mg/dL — ABNORMAL HIGH (ref 70–99)
Phosphorus: 2.9 mg/dL (ref 2.5–4.6)
Phosphorus: 4.2 mg/dL (ref 2.5–4.6)
Potassium: 5.9 mmol/L — ABNORMAL HIGH (ref 3.5–5.1)
Potassium: 5.3 mmol/L — ABNORMAL HIGH (ref 3.5–5.1)
Sodium: 129 mmol/L — ABNORMAL LOW (ref 135–145)
Sodium: 131 mmol/L — ABNORMAL LOW (ref 135–145)

## 2020-10-27 LAB — CBC
HCT: 25.8 % — ABNORMAL LOW (ref 39.0–52.0)
Hemoglobin: 7.8 g/dL — ABNORMAL LOW (ref 13.0–17.0)
MCH: 27.7 pg (ref 26.0–34.0)
MCHC: 30.2 g/dL (ref 30.0–36.0)
MCV: 91.5 fL (ref 80.0–100.0)
Platelets: 348 10*3/uL (ref 150–400)
RBC: 2.82 MIL/uL — ABNORMAL LOW (ref 4.22–5.81)
RDW: 18.2 % — ABNORMAL HIGH (ref 11.5–15.5)
WBC: 17.1 10*3/uL — ABNORMAL HIGH (ref 4.0–10.5)
nRBC: 0 % (ref 0.0–0.2)

## 2020-10-27 LAB — COOXEMETRY PANEL
Carboxyhemoglobin: 2 % — ABNORMAL HIGH (ref 0.5–1.5)
Methemoglobin: 0.9 % (ref 0.0–1.5)
O2 Saturation: 58.6 %
Total hemoglobin: 8.1 g/dL — ABNORMAL LOW (ref 12.0–16.0)

## 2020-10-27 LAB — ECHO INTRAOPERATIVE TEE
Height: 70.5 in
Weight: 3202.84 oz

## 2020-10-27 LAB — GLUCOSE, CAPILLARY
Glucose-Capillary: 155 mg/dL — ABNORMAL HIGH (ref 70–99)
Glucose-Capillary: 158 mg/dL — ABNORMAL HIGH (ref 70–99)
Glucose-Capillary: 181 mg/dL — ABNORMAL HIGH (ref 70–99)
Glucose-Capillary: 207 mg/dL — ABNORMAL HIGH (ref 70–99)
Glucose-Capillary: 228 mg/dL — ABNORMAL HIGH (ref 70–99)
Glucose-Capillary: 228 mg/dL — ABNORMAL HIGH (ref 70–99)
Glucose-Capillary: 230 mg/dL — ABNORMAL HIGH (ref 70–99)
Glucose-Capillary: 233 mg/dL — ABNORMAL HIGH (ref 70–99)
Glucose-Capillary: 242 mg/dL — ABNORMAL HIGH (ref 70–99)

## 2020-10-27 LAB — PROTIME-INR
INR: 2.2 — ABNORMAL HIGH (ref 0.8–1.2)
Prothrombin Time: 23.3 seconds — ABNORMAL HIGH (ref 11.4–15.2)

## 2020-10-27 LAB — PREPARE RBC (CROSSMATCH)

## 2020-10-27 LAB — LACTATE DEHYDROGENASE: LDH: 310 U/L — ABNORMAL HIGH (ref 98–192)

## 2020-10-27 LAB — HEMOGLOBIN AND HEMATOCRIT, BLOOD
HCT: 30.6 % — ABNORMAL LOW (ref 39.0–52.0)
Hemoglobin: 9.5 g/dL — ABNORMAL LOW (ref 13.0–17.0)

## 2020-10-27 LAB — MAGNESIUM: Magnesium: 2.5 mg/dL — ABNORMAL HIGH (ref 1.7–2.4)

## 2020-10-27 MED ORDER — HEPARIN SODIUM (PORCINE) 1000 UNIT/ML DIALYSIS
1000.0000 [IU] | INTRAMUSCULAR | Status: DC | PRN
  Filled 2020-10-27 (×2): qty 6

## 2020-10-27 MED ORDER — PRISMASOL BGK 0/2.5 32-2.5 MEQ/L EC SOLN
Status: DC
  Filled 2020-10-27 (×34): qty 5000

## 2020-10-27 MED ORDER — PRISMASOL BGK 0/2.5 32-2.5 MEQ/L EC SOLN
Status: DC
  Filled 2020-10-27 (×5): qty 5000

## 2020-10-27 MED ORDER — INSULIN ASPART 100 UNIT/ML ~~LOC~~ SOLN
10.0000 [IU] | Freq: Three times a day (TID) | SUBCUTANEOUS | Status: DC
  Administered 2020-10-27 – 2020-10-30 (×10): 10 [IU] via SUBCUTANEOUS

## 2020-10-27 MED ORDER — PRISMASOL BGK 0/2.5 32-2.5 MEQ/L EC SOLN
Status: DC
  Filled 2020-10-27 (×11): qty 5000

## 2020-10-27 MED ORDER — SODIUM CHLORIDE 0.9% IV SOLUTION: Freq: Once | INTRAVENOUS | Status: AC

## 2020-10-27 MED ORDER — INSULIN GLARGINE 100 UNIT/ML ~~LOC~~ SOLN
25.0000 [IU] | Freq: Two times a day (BID) | SUBCUTANEOUS | Status: DC
  Administered 2020-10-27 – 2020-10-30 (×8): 25 [IU] via SUBCUTANEOUS
  Filled 2020-10-27 (×11): qty 0.25

## 2020-10-27 MED ORDER — HEPARIN (PORCINE) 2000 UNITS/L FOR CRRT
INTRAVENOUS_CENTRAL | Status: DC | PRN
  Filled 2020-10-27 (×2): qty 1000

## 2020-10-27 MED ORDER — WARFARIN SODIUM 2 MG PO TABS
2.0000 mg | ORAL_TABLET | Freq: Once | ORAL | Status: AC
  Administered 2020-10-27: 2 mg via ORAL
  Filled 2020-10-27: qty 1

## 2020-10-27 NOTE — Progress Notes (Signed)
[facility] NEPHROLOGY PROGRESS NOTE  Assessment/ Plan:  Acute kidney injury on chronic kidney disease stage IIIa: With underlying chronic kidney disease likely from diabetes/hypertension and worsening renal function from hemodynamically mediated ATN versus cardiorenal syndrome.   -restart CRRT/CVVHDF today, CVP's climbing, hyperkalemic today. Aim for net neg 50-100cc/hr. -Heparin thru circuit  Hyperkalemia -secondary to AKI, restarted cvvhdf as above  Acute exacerbation of chronic systolic heart failure with cardiogenic shock: status post LVAD. Remains on pressors with epinephrine, milrinone.  Volume status/UF managed with CRRT, kept net even. Holding CRRT as above. Repeat echo  Anemia:Likely secondary to chronic disease and exacerbated by recent surgical losses.  Transfuse PRBC as needed.  Atrial fibrillation: Intermittently tachycardic, continue amiodarone and on Coumadin   Hypophosphatemia: replete prn  Leukocytosis: stable, w/u per primary  Protein calorie malnutrition, hypoalbuminemia: push protein, per primary  Discussed with HF team.  Gean Quint, MD [facility]  Subjective: Seen and examined in ICU.  No acute events. Per staff, had about 150cc of urine on bladder scan. Uop around 30cc. Milrinone off, epi restarted. Had afib yesterday therefore amio back on. He feels dizzy after receiving oxycodone and had been sweating. Objective Vital signs in last 24 hours: Vitals:   10/27/20 0700 10/27/20 0740 10/27/20 0745 10/27/20 0800  BP:  101/81  108/83  Pulse:   (!) 107 96  Resp: 18 17 16 18   Temp:      TempSrc:      SpO2:   100% (!) 81%  Weight:      Height:       Weight change: 2.7 kg  Intake/Output Summary (Last 24 hours) at 10/27/2020 1029 Last data filed at 10/27/2020 0800 Gross per 24 hour  Intake 880.84 ml  Output 30 ml  Net 850.84 ml       Labs: Basic Metabolic Panel: Recent Labs  Lab 10/26/20 0418 10/26/20 1600  10/27/20 0424  NA 131* 130* 129*  K 4.7 5.8* 5.9*  CL 97* 96* 95*  CO2 25 25 25   GLUCOSE 146* 269* 259*  BUN 24* 31* 43*  CREATININE 1.68* 2.55* 3.53*  CALCIUM 8.5* 8.9 8.7*  PHOS 2.0* 2.6 2.9   Liver Function Tests: Recent Labs  Lab 10/26/20 0418 10/26/20 1600 10/27/20 0424  ALBUMIN 2.3* 2.4* 2.2*   No results for input(s): LIPASE, AMYLASE in the last 168 hours. No results for input(s): AMMONIA in the last 168 hours. CBC: Recent Labs  Lab 10/24/20 0432 10/25/20 0537 10/26/20 0418 10/26/20 1000 10/27/20 0424  WBC 16.2* 15.7* 16.8* 17.3* 17.1*  HGB 8.5* 8.3* 8.1* 8.0* 7.8*  HCT 27.6* 26.2* 26.7* 25.9* 25.8*  MCV 90.5 89.7 92.1 90.6 91.5  PLT 393 421* 396 370 348   Cardiac Enzymes: No results for input(s): CKTOTAL, CKMB, CKMBINDEX, TROPONINI in the last 168 hours. CBG: Recent Labs  Lab 10/26/20 1601 10/26/20 2007 10/27/20 0019 10/27/20 0423 10/27/20 0707  GLUCAP 286* 317* 228* 242* 155*    Iron Studies: No results for input(s): IRON, TIBC, TRANSFERRIN, FERRITIN in the last 72 hours. Studies/Results: DG CHEST PORT 1 VIEW  Result Date: 10/27/2020 CLINICAL DATA:  Fever. EXAM: PORTABLE CHEST 1 VIEW COMPARISON:  October 23, 2020. FINDINGS: Stable cardiomegaly. Left ventricular assist device is unchanged. Status post coronary bypass graft. Left-sided pacemaker is unchanged. Stable position of right internal jugular catheter. Feeding tube has been removed. No pneumothorax is noted. Right lung is clear. Mild left basilar subsegmental atelectasis may be present. Bony thorax is unremarkable. IMPRESSION: Stable  cardiomegaly. Left ventricular assist device is unchanged. Mild left basilar subsegmental atelectasis may be present. Electronically Signed   By: Marijo Conception M.D.   On: 10/27/2020 08:20    Medications: Infusions: . amiodarone 30 mg/hr (10/27/20 0800)  . epinephrine 2 mcg/min (10/27/20 0800)  . lactated ringers    . lactated ringers Stopped (10/17/20 1655)  .  prismasol BGK 2/2.5 dialysis solution    . prismasol BGK 2/2.5 replacement solution    . prismasol BGK 2/2.5 replacement solution      Scheduled Medications: . aspirin EC  81 mg Oral Daily  . B-complex with vitamin C  1 tablet Oral Daily  . Chlorhexidine Gluconate Cloth  6 each Topical Daily  . collagenase   Topical Daily  . feeding supplement  237 mL Oral TID BM  . gabapentin  100 mg Oral QHS  . insulin aspart  0-20 Units Subcutaneous Q4H  . insulin aspart  10 Units Subcutaneous TID WC  . insulin glargine  25 Units Subcutaneous BID  . lidocaine  1 patch Transdermal Q24H  . mouth rinse  15 mL Mouth Rinse BID  . metoCLOPramide (REGLAN) injection  5 mg Intravenous Q12H  . midodrine  15 mg Oral TID WC  . pantoprazole  40 mg Oral Daily  . polyethylene glycol  17 g Oral Daily  . senna-docusate  2 tablet Oral BID  . sodium chloride flush  10-40 mL Intracatheter Q12H  . sorbitol  30 mL Oral Daily  . Warfarin - Pharmacist Dosing Inpatient   Does not apply q1600    have reviewed scheduled and prn medications.  Physical Exam: General: NAD, sitting up in chair, diaphoretic  Heart: Mechanical heart sound/lvad hum Lungs: cta bl Abdomen:soft, Non-tender, non-distended Extremities: 1+ pitting edema bl Dialysis Access: Temporary RIJ HD catheter.  Marcus Johnson 10/27/2020,10:29 AM  LOS: 29 days

## 2020-10-27 NOTE — Progress Notes (Addendum)
ANTICOAGULATION CONSULT NOTE - Follow Up Consult  Pharmacy Consult for Warfarin Indication: LVAD  Allergies  Allergen Reactions  . Meclizine Hcl Anaphylaxis and Swelling  . Ivabradine Nausea Only    Patient Measurements: Height: 5' 10.5" (179.1 cm) Weight: 83.1 kg (183 lb 3.2 oz) IBW/kg (Calculated) : 74.15 Heparin Dosing Weight: 89.2 kg  Vital Signs: Temp: 99.3 F (37.4 C) (03/24 0000) Temp Source: Oral (03/24 0000) Pulse Rate: 30 (03/24 0400)  Labs: Recent Labs    10/25/20 0537 10/25/20 1509 10/26/20 0418 10/26/20 1000 10/26/20 1600 10/27/20 0424  HGB 8.3*  --  8.1* 8.0*  --  7.8*  HCT 26.2*  --  26.7* 25.9*  --  25.8*  PLT 421*  --  396 370  --  348  LABPROT 22.1*  --  21.8*  --   --  23.3*  INR 2.0*  --  2.0*  --   --  2.2*  HEPARINUNFRC <0.10*  --  <0.10*  --   --   --   CREATININE 1.76*   < > 1.68*  --  2.55* 3.53*   < > = values in this interval not displayed.    Estimated Creatinine Clearance: 23.6 mL/min (A) (by C-G formula based on SCr of 3.53 mg/dL (H)).   Medical History: Past Medical History:  Diagnosis Date  . AICD (automatic cardioverter/defibrillator) present    Single-chamber  implantable cardiac defibrillator - Medtronic  . Atrial fibrillation ([place])   . Cataract    Mixed form OU  . CHF (congestive heart failure) ([place])   . Chronic kidney disease   . Chronic kidney disease (CKD), stage III (moderate) (HCC)   . Chronic systolic heart failure ([place])    a. Echo 5/13: Mild LVE, mild LVH, EF 10%, anteroseptal, lateral, apical AK, mild MR, mild LAE, moderately reduced RVSF, mild RAE, PASP 60;  b. 07/2014 Echo: EF 20-2%, diff HK, AKI of antsep/apical/mid-apicalinferior, mod reduced RV.  Marland Kitchen Coronary artery disease    a. s/p CABG 2002 b. LHC 5/13:  dLM 80%, LAD subtotally occluded, pCFX occluded, pRCA 50%, mid? Occlusion with high take off of the PDA with 70% multiple lesions-not bypassed and supplies collaterals to LAD, LIMA-IM/ramus ok, S-OM ok, S-PLA  branches ok. Medical therapy was recommended  . COVID   . Diabetic retinopathy (HCC)    NPDR OD, PDR OS  . Dyspnea   . Gout    "on daily RX" (01/08/2018)  . Hypertension   . Hypertensive retinopathy    OU  . Ischemic cardiomyopathy    a. Echo 5/13: Mild LVE, mild LVH, EF 10%, anteroseptal, lateral, apical AK, mild MR, mild LAE, moderately reduced RVSF, mild RAE, PASP 60;  b. 01/2012 s/p MDT [id number] Protecta XT VR AICD;  c. 07/2014 Echo: EF 20-2%, diff HK, AKI of antsep/apical/mid-apicalinferior, mod reduced RV.  Marland Kitchen MRSA (methicillin resistant Staphylococcus aureus)    Status post right foot plantar deep infection with MRSA status post  I&D 10/2008  . Myocardial infarction [facility])    "was told I'd had several before heart OR in 2002" (01/08/2018)  . Noncompliance   . Peripheral neuropathy   . Retinopathy, diabetic, background ([place])   . Syncope   . Type II diabetes mellitus ([place])    requiring insulin   . Vitreous hemorrhage, left (HCC)    and proliferative diabetic retinopathy    Assessment: 60 yo M s/p new LVAD implant with HeartMate III on 3/10. Warfarin started per MD on 3/12, and pharmacy asked  to take over warfarin dosing.   INR remains therapeutic at 2.2 today. Currently taking 2-3 Ensures daily but eating significantly less of his meal today. Hgb down slightly 7.8, for 1u PRBC today. No overt bleeding. LDH stable 300s.   Goal of Therapy:  INR 2-2.5 Monitor platelets by anticoagulation protocol: Yes   Plan:  Warfarin 2 mg PO tonight  Daily PT/INR, CBC, monitor s/sx bleeding  [name], PharmD, BCPS PGY2 Cardiology Pharmacy Resident Phone: [phone] 10/27/2020  7:35 AM  Please check [email or web address] for unit-specific pharmacy phone numbers.

## 2020-10-27 NOTE — Progress Notes (Signed)
LVAD Coordinator Rounding Note:  Admitted 09/28/20 due to worsening heart failure symptoms. Recent COVID infection January 2022; did not take medications for 3-4 weeks.   S/p dental extractions 10/07/20 per Dr Benson Norway.  HM III LVAD implanted on 10/13/20 by Dr Cyndia Bent under Destination Therapy criteria.  Pt asleep, lying on left side. Patient not feeling well this am, will not wake him at this time.   Patient back in Afib with rate in 100's; Amiodarone gtt re-started per Dr. Aundra Dubin. Will get one unit PCs today with dialysis when CVVH re-started.   Vital signs: Temp: 99.3 HR: 101 - Afib Doppler: 86 Auto cuff: 101/81 (90) O2 Sat: 97% on RA Wt: 200.1>221.7>219.5..........212>196.6>192.4>182.7>174.8>177.2>183.2 lbs    LVAD interrogation reveals:  Speed: 5300 Flow: 4.5 Power: 3.8w PI: 3.5 Hematocrit: 27  Alarms: none Events: 22 PI events 10/26/20  Fixed speed: 5300 Low speed limit: 5000  Drive Line: Existing VAD dressing removed and site care performed using sterile technique per [employer]. Drive line exit site cleaned with Chlora prep applicators x 2, allowed to dry, and gauze dressing with silver strip re-applied; angled to leave skin tears open to heal. NO skin prep used w/ possible skin intolerance. Exit site healing and unincorporated the velour is fully implanted at exit site. There is 1 suture visible that is wrapped around the driveline. Small amount of brown drainage from fat necrosis. No redness, tenderness, foul odor or rash noted. Few inches of drive line from exit site to modular cable connection, making positioning/anchoring of drive line difficult. Drive line anchor correctly applied. Continue daily dressing changes by VAD coordinator or nurse champion. Next dressing change is due 10/28/20.   Labs:  LDH trend: 444>670>575>571>485>467>541>414>370>361>327>310  INR trend: 1.4>1.7>1.8>1.7>1.6>1.8>1.9>2.0>2.0>2.2  Creatinine:  1.84>3.0>3.4>2.73>2.04>1.70>1.85>1.83>1.86>1.76>1.68>2.55>3.53  Anticoagulation Plan: -INR Goal: 2.0-2.5 -ASA Dose: 325 mg until INR therapeutic  Blood Products:  IntraOp:   1 Platelet  6 FFP  3 PRBC  1035 cell saver Postop:   10/13/20:   4 FFP  4 PRBC    10/14/20:  1 FFP  1 PRBC   10/16/20:  1 PRBC   10/18/20:  1 PRBC   10/16/20:  1 PRBC   10/18/20:  1 PRBC  Device: -  Medtronic single - Therapies: off   Arrythmias: VT in OR requiring defibrillation   Renal: CRRT   - started 10/19/20   - stopped 10/26/20   - re-started 10/27/20   Drips: Epinephrine 3 mcg/min Amiodarone 30 mg/hr   Infection: - 10/27/20 respiratory culture - needs to be collected - 10/27/20 blood cultures - pending  Patient Education: 1. No family at bedside this morning. Pt states he is tired and in pain; teaching not appropriate at this time.   Plan/Recommendations:  1. Call VAD coordinator for equipment or drive line issues 2. Daily drive line dressing change per VAD coordinator or nurse champion. Next dressing change due 10/27/20  Zada Girt RN [place] Coordinator  Office: [phone]  24/7 Pager: [phone]

## 2020-10-27 NOTE — Progress Notes (Signed)
Occupational Therapy Treatment Patient Details Name: Marcus Johnson. MRN: [id number] DOB: 12/11/1960 Today's Date: 10/27/2020    History of present illness 60 y.o. male admitted 2/28 with ICM for LVAD workup. Impella placed 3/1. Heartmate 3 LVAD placed 3/10. CRRT started 3/15. CRRT held 3/23-3/24 then restarted. PMHx: DM2, CAD s/p CABG 3335, systolic HF due to ischemic cardiomyopathy with EF 20-25% (echo 12/15), DM2 and CKD. He is s/p Medtronic single chamber ICD. Covid 1/22.   OT comments  Excellent participation today. Incorporated music and playing cards into session to increase functional movement patterns of RUE. Thumb strap used to move thumb into opposition in addition to buddy taping index and middle fingers together to improve functional pinch pattern. Empty bottle left on table for pt to use to work on [employer] in preparation for manipulation connector of power cord and battery pack.  Pt "dancing" in bed during session to address exercise and endurance. Pt laughing and joking with OT. Will continue to follow acutely.  Follow Up Recommendations  CIR;Supervision/Assistance - 24 hour    Equipment Recommendations  Other (comment)    Recommendations for Other Services Rehab consult    Precautions / Restrictions Precautions Precautions: Fall;Sternal Precaution Booklet Issued: Yes (comment) Precaution Comments: LVAD, presyncope,  CRRT held 3/23-3/24       Mobility Bed Mobility Overal bed mobility: Needs Assistance   Rolling: Min assist         General bed mobility comments: vc for problem solving when rolling; pt getting use ot staff totally rolling him. Will benefit form reinforcing pt to do what he can with bed mobility    Transfers                      Balance     Sitting balance-Leahy Scale: Fair                                     ADL either performed or assessed with clinical judgement   ADL    Eating/Feeding: Set up   Grooming: Minimal assistance;Sitting                                       Vision       Perception     Praxis      Cognition Arousal/Alertness: Awake/alert Behavior During Therapy: WFL for tasks assessed/performed Overall Cognitive Status: Impaired/Different from baseline Area of Impairment: Problem solving;Attention;Awareness                   Current Attention Level: Selective Memory: Decreased short-term memory Following Commands: Follows one step commands consistently   Awareness: Emergent Problem Solving: Slow processing General Comments: more interactive as session progressed        Exercises General Exercises - Upper Extremity Shoulder Flexion: Both;10 reps;Supine;Seated (R to 90) Shoulder ABduction: AROM;AAROM;Both;10 reps;Seated Elbow Flexion: AROM;Right;10 reps;Seated Elbow Extension: AROM;Right;10 reps;Seated Wrist Flexion: AROM;AAROM;Right;10 reps;Seated Digit Composite Flexion: AROM;AAROM;Right;20 reps;Seated;Squeeze ball;Other (comment) (foam tubing x 20 - lateral adn "3 jaw chuck" position) General Exercises - Lower Extremity Heel Slides: Both;10 reps;Supine   Shoulder Instructions       General Comments RUE general strength appers to be improving, especially proximally. Pt moved into L semi sidelying in seated position to work on reaching across body to pick up cards while  playing a card game. Used thumb strap to pull thumb into opposition and buddy taped index to middle finger to improve pinch strength adn functional pinch pattern.    Pertinent Vitals/ Pain       Pain Assessment: Faces Faces Pain Scale: Hurts little more Pain Location: sacrum Pain Descriptors / Indicators: Discomfort;Grimacing;Aching Pain Intervention(s): Limited activity within patient's tolerance  Home Living                                          Prior Functioning/Environment               Frequency  Min 2X/week        Progress Toward Goals  OT Goals(current goals can now be found in the care plan section)  Progress towards OT goals: Progressing toward goals  Acute Rehab OT Goals Patient Stated Goal: to go home and be with his grandchildren OT Goal Formulation: With patient Time For Goal Achievement: 10/31/20 Potential to Achieve Goals: Good ADL Goals Pt Will Perform Eating: with set-up;with adaptive utensils Pt Will Perform Grooming: with set-up;sitting;with adaptive equipment Pt Will Transfer to Toilet: with min guard assist;ambulating Pt/caregiver will Perform Home Exercise Program: Increased ROM;Increased strength;Both right and left upper extremity;With minimal assist;With written HEP provided Additional ADL Goal #1: Pt will be able to recall sternal precautions Additional ADL Goal #2: Pt will be able to change out his lines from battery<>wall with min A  Plan Discharge plan remains appropriate    Co-evaluation                 AM-PAC OT "6 Clicks" Daily Activity     Outcome Measure   Help from another person eating meals?: A Little Help from another person taking care of personal grooming?: A Little Help from another person toileting, which includes using toliet, bedpan, or urinal?: Total Help from another person bathing (including washing, rinsing, drying)?: Total Help from another person to put on and taking off regular upper body clothing?: Total Help from another person to put on and taking off regular lower body clothing?: Total 6 Click Score: 10    End of Session    OT Visit Diagnosis: Unsteadiness on feet (R26.81);Other abnormalities of gait and mobility (R26.89);Muscle weakness (generalized) (M62.81);Other symptoms and signs involving cognitive function;Dizziness and giddiness (R42) Pain - Right/Left: Right Pain - part of body: Arm   Activity Tolerance Patient tolerated treatment well   Patient Left in bed;with call bell/phone  within reach   Nurse Communication Other (comment) (encourage functional use RUE)        Time: 5364-6803 OT Time Calculation (min): 40 min  Charges: OT General Charges $OT Visit: 1 Visit OT Treatments $Self Care/Home Management : 8-22 mins $Neuromuscular Re-education: 23-37 mins  Maurie Boettcher, OT/L   Acute OT Clinical Specialist Acute Rehabilitation Services Pager [phone] Office [phone]    [facility] 10/27/2020, 4:13 PM

## 2020-10-27 NOTE — Progress Notes (Signed)
Physical Therapy Treatment Patient Details Name: Marcus Johnson. MRN: [id number] DOB: [birth year] Today's Date: 10/27/2020    History of Present Illness 60 y.o. male admitted 2/28 with ICM for LVAD workup. Impella placed 3/1. Heartmate 3 LVAD placed 3/10. CRRT started 3/15. CRRT held 3/23-3/24 then restarted. PMHx: DM2, CAD s/p CABG 9381, systolic HF due to ischemic cardiomyopathy with EF 20-25% (echo 12/15), DM2 and CKD. He is s/p Medtronic single chamber ICD. Covid 1/22.    PT Comments    Pt in chair on arrival stating sacral pain and needing to return to bed for CRRT restart. Pt received oxy prior to arrival with diaphoresis on arrival and stating dizziness. Pt with slowly improving standing tolerance able to step 8' today prior to presyncope and beginning to recall steps for LVAD battery transition although continues to require assist to perform due to cognition and RUE weakness. Will continue to follow. Pt with pillow under right hip in bed.   Speed 5300, flow 4.6, PI 4.2-2.8, power 3.8 MAP 90 HR 108 SpO2 100% RA    Follow Up Recommendations  Supervision/Assistance - 24 hour;CIR     Equipment Recommendations  Other (comment) (TBD)    Recommendations for Other Services       Precautions / Restrictions Precautions Precautions: Fall;Sternal Precaution Comments: LVAD, presyncope,  CRRT held 3/23-3/24 Restrictions Weight Bearing Restrictions: Yes (sternal)    Mobility  Bed Mobility Overal bed mobility: Needs Assistance Bed Mobility: Sit to Supine       Sit to supine: Min assist   General bed mobility comments: min assist to fully clear legs onto surface with return to bed    Transfers Overall transfer level: Needs assistance   Transfers: Sit to/from Stand Sit to Stand: +2 safety/equipment;Min guard         General transfer comment: pt with good hand placement on thighs and able to stand from recliner and bed with guarding and +2 for  safety.  Ambulation/Gait Ambulation/Gait assistance: Min assist;+2 safety/equipment Gait Distance (Feet): 8 Feet Assistive device: 2 person hand held assist Gait Pattern/deviations: Shuffle   Gait velocity interpretation: <1.8 ft/sec, indicate of risk for recurrent falls General Gait Details: pt able to walk length of bed forward and back with bil UE support on therapist arms and stability with belt. With stepping back to surface pt with his freeze/tremor related to presyncope and assisted to sitting EOB   Stairs             Wheelchair Mobility    Modified Rankin (Stroke Patients Only)       Balance Overall balance assessment: Needs assistance   Sitting balance-Leahy Scale: Fair Sitting balance - Comments: sitting at EOB and edge of chair no support when not presynopal. With presyncope max assist for sitting balance   Standing balance support: Bilateral upper extremity supported Standing balance-Leahy Scale: Poor Standing balance comment: UE support for stepping                            Cognition Arousal/Alertness: Awake/alert Behavior During Therapy: Flat affect Overall Cognitive Status: Impaired/Different from baseline Area of Impairment: Memory                     Memory: Decreased short-term memory       Problem Solving: Slow processing General Comments: pt able to recall therapists and 2/4 precautions. Pt recalling some parts and sequence of battery transition. Pt able to state  need to check battery and align arrows but needs assist to actually perform and with assist able to unscrew and reattach power with left hand      Exercises General Exercises - Lower Extremity Long Arc Quad: Both;20 reps;Seated;AAROM Hip Flexion/Marching: Both;Seated;AAROM;20 reps    General Comments        Pertinent Vitals/Pain Pain Score: 6  Pain Location: sacrum Pain Descriptors / Indicators: Discomfort;Grimacing;Aching Pain Intervention(s): Limited  activity within patient's tolerance;Monitored during session;Premedicated before session;Repositioned    Home Living                      Prior Function            PT Goals (current goals can now be found in the care plan section) Progress towards PT goals: Progressing toward goals    Frequency    Min 4X/week      PT Plan Current plan remains appropriate    Co-evaluation              AM-PAC PT "6 Clicks" Mobility   Outcome Measure  Help needed turning from your back to your side while in a flat bed without using bedrails?: A Little Help needed moving from lying on your back to sitting on the side of a flat bed without using bedrails?: A Little Help needed moving to and from a bed to a chair (including a wheelchair)?: A Lot Help needed standing up from a chair using your arms (e.g., wheelchair or bedside chair)?: A Little Help needed to walk in hospital room?: A Lot Help needed climbing 3-5 steps with a railing? : Total 6 Click Score: 14    End of Session Equipment Utilized During Treatment: Gait belt Activity Tolerance: Patient tolerated treatment well;Patient limited by fatigue Patient left: in bed;with call bell/phone within reach;with nursing/sitter in room Nurse Communication: Precautions;Mobility status PT Visit Diagnosis: Other abnormalities of gait and mobility (R26.89);Muscle weakness (generalized) (M62.81);Difficulty in walking, not elsewhere classified (R26.2);Pain     Time: 2774-1287 PT Time Calculation (min) (ACUTE ONLY): 31 min  Charges:  $Therapeutic Exercise: 8-22 mins $Therapeutic Activity: 8-22 mins                     Livian Vanderbeck P, PT Acute Rehabilitation Services Pager: [phone] Office: [place] 10/27/2020, 9:30 AM

## 2020-10-27 NOTE — Progress Notes (Addendum)
Patient ID: Marcus Fosnaugh., male   DOB: 07/15/1961, 60 y.o.   MRN: [id number]     Advanced Heart Failure Rounding Note  PCP-Cardiologist: No primary care provider on file.   Subjective:    - 2/27 Milrinone switched to dobutamine 5 mcg.  - 2/28 Moved to ICU. Dobutamine increased to 7.5 mcg and Norepi 3 mcg added. Diuretics held.  - 3/1 Impella 5.5 placed.  - 3/4 Teeth removed - 3/10 HM3 LVAD placed -3/13 VAD speed increased to 5500. [name] out  - 3/14 Nephrology consulted. Given 1UPRBCs  - 3/15 Started CRRT. Given 1UPRBCs.  - 3/23 VAD Speed turned down to 5300  - 3/23 CRRT and Milrinone Stopped. Back in Afib.   Yesterday CRRT and milrinone both stopped. Went back in Afib w/ RVR and started back on amio gtt.   Today, Co-ox 59%. Remains on Epi 3 + Amio gt at 30/hr   Only 30 cc in urine production yesterday. SCr 2.5>>3.5. K 5.9 Na 129 Wt up 6 lb. CVP 13-14  Sinus tach on tele. HR low 100s. Diaphoretic. Feels bad. Has decubitus ulcer. WBC 17K. Low grade fever overnight, mTemp 100.    INR up to 2.2 LDH 310  Echo: EF 20-25%, normal RV, PASP 61, dilated IVC.   LVAD Interrogation HM 3: Speed: 5300 Flow: 4.6   PI: 4.3   Power: 3.2. no PI events  Objective:   Weight Range: 83.1 kg Body mass index is 25.92 kg/m.   Vital Signs:   Temp:  [98.3 F (36.8 C)-100.1 F (37.8 C)] 99.3 F (37.4 C) (03/24 0000) Pulse Rate:  [28-111] 30 (03/24 0400) Resp:  [7-26] 11 (03/24 0400) BP: (131)/(110) 131/110 (03/23 1600) SpO2:  [88 %-100 %] 98 % (03/24 0400) Weight:  [83.1 kg] 83.1 kg (03/24 0500) Last BM Date: 10/21/20  Weight change: Filed Weights   10/25/20 0600 10/26/20 0641 10/27/20 0500  Weight: 79.3 kg 80.4 kg 83.1 kg    Intake/Output:   Intake/Output Summary (Last 24 hours) at 10/27/2020 0714 Last data filed at 10/27/2020 0400 Gross per 24 hour  Intake 1367.81 ml  Output 69 ml  Net 1298.81 ml      Physical Exam   CVP 13-14 General: Fatigue appearing,  diaphoretic. No distress.  HEENT: Normal. Neck: Supple, JVP to jaw. Carotids OK.  Cardiac:  Mechanical heart sounds with LVAD hum present.  Lungs:  Faint crackles at the bases bilaterally, no wheezing  Abdomen:  NT, ND, no HSM. No bruits or masses. +BS  LVAD exit site: Well-healed and incorporated. Dressing dry and intact. No erythema or drainage. Stabilization device present and accurately applied. Driveline dressing changed daily per sterile technique. Extremities:  Cool extremities and dry. No cyanosis, clubbing, rash, or edema.  Neuro:  Alert & oriented x 3. Cranial nerves grossly intact. Moves all 4 extremities w/o difficulty. Affect pleasant     Telemetry   Sinus tach low 100s  Personally reviewed   Labs    CBC Recent Labs    10/26/20 1000 10/27/20 0424  WBC 17.3* 17.1*  HGB 8.0* 7.8*  HCT 25.9* 25.8*  MCV 90.6 91.5  PLT 370 778   Basic Metabolic Panel Recent Labs    10/26/20 0418 10/26/20 1600 10/27/20 0424  NA 131* 130* 129*  K 4.7 5.8* 5.9*  CL 97* 96* 95*  CO2 25 25 25   GLUCOSE 146* 269* 259*  BUN 24* 31* 43*  CREATININE 1.68* 2.55* 3.53*  CALCIUM 8.5* 8.9 8.7*  MG  2.4  --  2.5*  PHOS 2.0* 2.6 2.9   Liver Function Tests Recent Labs    10/26/20 1600 10/27/20 0424  ALBUMIN 2.4* 2.2*   No results for input(s): LIPASE, AMYLASE in the last 72 hours. Cardiac Enzymes No results for input(s): CKTOTAL, CKMB, CKMBINDEX, TROPONINI in the last 72 hours.  BNP: BNP (last 3 results) Recent Labs    10/14/20 0254 10/20/20 0305 10/27/20 0020  BNP 1,097.8* 466.7* 774.9*    ProBNP (last 3 results) No results for input(s): PROBNP in the last 8760 hours.   D-Dimer No results for input(s): DDIMER in the last 72 hours. Hemoglobin A1C No results for input(s): HGBA1C in the last 72 hours. Fasting Lipid Panel No results for input(s): CHOL, HDL, LDLCALC, TRIG, CHOLHDL, LDLDIRECT in the last 72 hours. Thyroid Function Tests No results for input(s): TSH,  T4TOTAL, T3FREE, THYROIDAB in the last 72 hours.  Invalid input(s): FREET3  Other results:   Imaging    No results found.   Medications:     Scheduled Medications: . aspirin EC  81 mg Oral Daily  . B-complex with vitamin C  1 tablet Oral Daily  . Chlorhexidine Gluconate Cloth  6 each Topical Daily  . collagenase   Topical Daily  . feeding supplement  237 mL Oral TID BM  . gabapentin  100 mg Oral QHS  . insulin aspart  0-20 Units Subcutaneous Q4H  . insulin aspart  5 Units Subcutaneous TID WC  . insulin glargine  20 Units Subcutaneous BID  . lidocaine  1 patch Transdermal Q24H  . mouth rinse  15 mL Mouth Rinse BID  . metoCLOPramide (REGLAN) injection  5 mg Intravenous Q12H  . midodrine  15 mg Oral TID WC  . pantoprazole  40 mg Oral Daily  . polyethylene glycol  17 g Oral Daily  . senna-docusate  2 tablet Oral BID  . sodium chloride flush  10-40 mL Intracatheter Q12H  . sorbitol  30 mL Oral Daily  . Warfarin - Pharmacist Dosing Inpatient   Does not apply q1600    Infusions: . amiodarone 30 mg/hr (10/27/20 0432)  . epinephrine Stopped (10/27/20 0351)  . insulin Stopped (10/26/20 1329)  . lactated ringers    . lactated ringers Stopped (10/17/20 1655)    PRN Medications: dextrose, dextrose, morphine injection, ondansetron (ZOFRAN) IV, oxyCODONE, sodium chloride flush, traMADol, white petrolatum    Assessment/Plan   1.Acute on chronic systolic HF -> cardiogenic shock:ICM.Has[employer] ICD. EF 15%. Low output persisted despite milrinone and then dobutamine with rise in creatinine to 2.5. Impella 5.5 placed 3/1.  Creatinine improved to 1.7 and HM3 LVAD was placed on 3/10. Developed post-op renal failure requiring CVVH. Now off milrinone. On Epi 3. Co-ox 59%. VAD speed decreased to 5300. CRRT stopped yesterday to see if renal recovery. Only 30 cc urine production yesterday. Wt up 6 lb. CVP 13-14.  - Will need to restart CRRT - Wean Epi today  - Continue midodrine 15  mg tid.  2. LVAD: 3/13 VAD speed increased to 5500. 3/23 speed decreased to 5300. VAD interrogated personally. Parameters stable.  LDH stable at 310 - INR 2.2 today on warfarin, on ASA 81 daily. - DL site ok 3. CAD s/p CABG2002:Last cath in 6/19 with patent grafts.  - Continue Crestor 40 mg daily.  4. AKI on CKD Stage 3b: Suspect that this is a combination of cardiorenal syndrome and diabetic nephropathy and possibly ATN. Developed post-op AKI. Started on CRRT 3/15. CVVH stopped  yesterday. Little UOP. Volume status trending up.  - Plan to resume CVVH  - Nephrology appreciated.  5. Atrial flutter/fibrillation: s/p DC-CV 08/21/18. Back in Afib w/ RVR yesterday. Restarted on amio gtt. Sinus tach this morning.   - continue amio gtt at 30/hr  6. Wound R Foot: Partial thickness skin loss noted. Cover with mepilex border and change every 5 days.  7. Right arm pain: Think neuropathic, per Dr. Cyndia Bent patient had stretch of brachial plexus with Impella placement.  8. ID:  WBC 17K. Low grade fever overnight, mTemp 100. Check blood and sputum cx 9. JK:KXFGHWE of very poor control but hgbA1ctrending down. Most recent 7.7%. Glucose very high yesterday, insulin gtt started.  - Consulted diabetes coordinator.  10. Anemia: hgb 7.8. Will need 1 Unit of blood w/ CVVH  11. Deconditioning - PT/OT following  - will need CIR once medically stable  12. Stage 2 sacral decubitus ulcer: Wound care.  13. Hyperkalemia: K 5.9  - restart CVVH   Length of Stay: [address] PA-C  10/27/2020, 7:14 AM  Advanced Heart Failure Team Pager [phone] (M-F; [place])  Please contact [facility] for night-coverage after hours (4p -7a ) and weekends on [email or web address]  Patient seen with PA, agree with the above note.   Minimal UOP, 30 cc recorded and has about 150 cc in bladder.  K and creatinine rose rapidly.  CVP 13 on my read.  Co-ox 59%, MAP 90.  LVAD speed decreased to 5300 rpm yesterday, no PI events.    Tm 100.1, WBCs 17.  Hgb down to 7.8.   General: Well appearing this am. NAD.  HEENT: Normal. Neck: Supple, JVP 10-12 cm. Carotids OK.  Cardiac:  Mechanical heart sounds with LVAD hum present.  Lungs:  CTAB, normal effort.  Abdomen:  NT, ND, no HSM. No bruits or masses. +BS  LVAD exit site: Well-healed and incorporated. Dressing dry and intact. No erythema or drainage. Stabilization device present and accurately applied. Driveline dressing changed daily per sterile technique. Extremities:  Warm and dry. No cyanosis, clubbing, rash, or edema.  Neuro:  Alert & oriented x 3. Cranial nerves grossly intact. Moves all 4 extremities w/o difficulty. Affect pleasant    We are going to need to restart CVVH today.  I discussed this with nephrology, will aim for net negative 50-100 cc/hr.   Decrease epinephrine to 2. Continue slow down-titration to off.   Still with pain from decubitus ulcer.  Continue to mobilize.   Will give 1 unit PRBCs today.  Panculture and get pCXR.   CRITICAL CARE Performed by: Loralie Champagne  Total critical care time: 40 minutes  Critical care time was exclusive of separately billable procedures and treating other patients.  Critical care was necessary to treat or prevent imminent or life-threatening deterioration.  Critical care was time spent personally by me on the following activities: development of treatment plan with patient and/or surrogate as well as nursing, discussions with consultants, evaluation of patient's response to treatment, examination of patient, obtaining history from patient or surrogate, ordering and performing treatments and interventions, ordering and review of laboratory studies, ordering and review of radiographic studies, pulse oximetry and re-evaluation of patient's condition.  Loralie Champagne 10/27/2020 7:52 AM

## 2020-10-27 NOTE — Progress Notes (Signed)
Inpatient Diabetes Program Recommendations  AACE/ADA: New Consensus Statement on Inpatient Glycemic Control   Target Ranges:  Prepandial:   less than 140 mg/dL      Peak postprandial:   less than 180 mg/dL (1-2 hours)      Critically ill patients:  140 - 180 mg/dL  Results for ADRYEN, COOKSON (MRN [id number]) as of 10/27/2020 08:48  Ref. Range 10/27/2020 04:24  Glucose Latest Ref Range: 70 - 99 mg/dL 259 (H)   Results for BRAIDEN, RODMAN (MRN [id number]) as of 10/27/2020 08:48  Ref. Range 10/26/2020 11:19 10/26/2020 12:00 10/26/2020 13:24 10/26/2020 16:01 10/26/2020 20:07 10/27/2020 00:19  Glucose-Capillary Latest Ref Range: 70 - 99 mg/dL 148 (H) 164 (H) 278 (H) 286 (H) 317 (H) 228 (H)   Review of Glycemic Control Diabetes history: DM2 Outpatient Diabetes medications: Jardiance 10 mg daily, Novolog 5 units TID with meals, Lantus 50 units daily Current orders for Inpatient glycemic control: Lantus 20 units BID, Novolog 0-20 units Q4H, Novolog 5 units TID with meals  Inpatient Diabetes Program Recommendations:    Insulin: Please consider increasing Lantus to 25 units BID and meal coverage to Novolog 10 units TID with meals.  Thanks, Barnie Alderman, RN, MSN, CDE Diabetes Coordinator Inpatient Diabetes Program [phone] (Team Pager from 8am to 5pm)

## 2020-10-28 DIAGNOSIS — Z95811 Presence of heart assist device: Secondary | ICD-10-CM | POA: Diagnosis not present

## 2020-10-28 DIAGNOSIS — I5023 Acute on chronic systolic (congestive) heart failure: Secondary | ICD-10-CM | POA: Diagnosis not present

## 2020-10-28 LAB — BPAM RBC
Blood Product Expiration Date: 202204232359
ISSUE DATE / TIME: 202203241554
Unit Type and Rh: 600

## 2020-10-28 LAB — COOXEMETRY PANEL
Carboxyhemoglobin: 2 % — ABNORMAL HIGH (ref 0.5–1.5)
Methemoglobin: 0.7 % (ref 0.0–1.5)
O2 Saturation: 58.5 %
Total hemoglobin: 8.9 g/dL — ABNORMAL LOW (ref 12.0–16.0)

## 2020-10-28 LAB — CBC
HCT: 29.9 % — ABNORMAL LOW (ref 39.0–52.0)
Hemoglobin: 9.2 g/dL — ABNORMAL LOW (ref 13.0–17.0)
MCH: 27.7 pg (ref 26.0–34.0)
MCHC: 30.8 g/dL (ref 30.0–36.0)
MCV: 90.1 fL (ref 80.0–100.0)
Platelets: 325 10*3/uL (ref 150–400)
RBC: 3.32 MIL/uL — ABNORMAL LOW (ref 4.22–5.81)
RDW: 17.8 % — ABNORMAL HIGH (ref 11.5–15.5)
WBC: 14.5 10*3/uL — ABNORMAL HIGH (ref 4.0–10.5)
nRBC: 0 % (ref 0.0–0.2)

## 2020-10-28 LAB — TYPE AND SCREEN
ABO/RH(D): A NEG
Antibody Screen: NEGATIVE
Unit division: 0

## 2020-10-28 LAB — RENAL FUNCTION PANEL
Albumin: 2.4 g/dL — ABNORMAL LOW (ref 3.5–5.0)
Albumin: 2.5 g/dL — ABNORMAL LOW (ref 3.5–5.0)
Anion gap: 11 (ref 5–15)
Anion gap: 9 (ref 5–15)
BUN: 33 mg/dL — ABNORMAL HIGH (ref 6–20)
BUN: 28 mg/dL — ABNORMAL HIGH (ref 6–20)
CO2: 26 mmol/L (ref 22–32)
CO2: 28 mmol/L (ref 22–32)
Calcium: 8.7 mg/dL — ABNORMAL LOW (ref 8.9–10.3)
Calcium: 8.6 mg/dL — ABNORMAL LOW (ref 8.9–10.3)
Chloride: 95 mmol/L — ABNORMAL LOW (ref 98–111)
Chloride: 95 mmol/L — ABNORMAL LOW (ref 98–111)
Creatinine, Ser: 2.54 mg/dL — ABNORMAL HIGH (ref 0.61–1.24)
Creatinine, Ser: 2.6 mg/dL — ABNORMAL HIGH (ref 0.61–1.24)
GFR, Estimated: 28 mL/min — ABNORMAL LOW (ref >60)
GFR, Estimated: 28 mL/min — ABNORMAL LOW (ref >60)
Glucose, Bld: 144 mg/dL — ABNORMAL HIGH (ref 70–99)
Glucose, Bld: 166 mg/dL — ABNORMAL HIGH (ref 70–99)
Phosphorus: 2.6 mg/dL (ref 2.5–4.6)
Phosphorus: 2.5 mg/dL (ref 2.5–4.6)
Potassium: 4.8 mmol/L (ref 3.5–5.1)
Potassium: 4.6 mmol/L (ref 3.5–5.1)
Sodium: 132 mmol/L — ABNORMAL LOW (ref 135–145)
Sodium: 132 mmol/L — ABNORMAL LOW (ref 135–145)

## 2020-10-28 LAB — LACTATE DEHYDROGENASE: LDH: 306 U/L — ABNORMAL HIGH (ref 98–192)

## 2020-10-28 LAB — PROTIME-INR
INR: 2.1 — ABNORMAL HIGH (ref 0.8–1.2)
Prothrombin Time: 23 seconds — ABNORMAL HIGH (ref 11.4–15.2)

## 2020-10-28 LAB — GLUCOSE, CAPILLARY
Glucose-Capillary: 130 mg/dL — ABNORMAL HIGH (ref 70–99)
Glucose-Capillary: 98 mg/dL (ref 70–99)
Glucose-Capillary: 196 mg/dL — ABNORMAL HIGH (ref 70–99)
Glucose-Capillary: 117 mg/dL — ABNORMAL HIGH (ref 70–99)

## 2020-10-28 LAB — MAGNESIUM: Magnesium: 2.4 mg/dL (ref 1.7–2.4)

## 2020-10-28 MED ORDER — POLYETHYLENE GLYCOL 3350 17 G PO PACK
17.0000 g | PACK | Freq: Two times a day (BID) | ORAL | Status: DC
  Administered 2020-10-28 – 2020-10-30 (×4): 17 g via ORAL
  Filled 2020-10-28 (×4): qty 1

## 2020-10-28 MED ORDER — WARFARIN SODIUM 3 MG PO TABS
3.0000 mg | ORAL_TABLET | Freq: Once | ORAL | Status: AC
  Administered 2020-10-28: 3 mg via ORAL
  Filled 2020-10-28: qty 1

## 2020-10-28 NOTE — Progress Notes (Signed)
EVENING ROUNDS NOTE :     [address]       [place] [address]             [phone]                 15 Days Post-Op Procedure(s) (LRB): REDO STERNOTOMY (N/A) INSERTION OF IMPLANTABLE LEFT VENTRICULAR ASSIST DEVICE (N/A) TRANSESOPHAGEAL ECHOCARDIOGRAM (TEE) (N/A) REMOVAL OF IMPELLA LEFT VENTRICULAR ASSIST DEVICE   Total Length of Stay:  LOS: 30 days  Events:   No events    BP (!) 84/66   Pulse 86   Temp 97.8 F (36.6 C)   Resp 17   Ht 5' 10.5" (1.791 m)   Wt 81.7 kg   SpO2 100%   BMI 25.48 kg/m   CVP:  [14 mmHg-21 mmHg] 17 mmHg     . amiodarone 30 mg/hr (10/28/20 1200)  . epinephrine 1 mcg/min (10/28/20 1200)  . lactated ringers    . lactated ringers Stopped (10/17/20 1655)  . prismasol BGK 2/2.5 dialysis solution 1,500 mL/hr at 10/28/20 1017  . prismasol BGK 2/2.5 replacement solution 500 mL/hr at 10/27/20 1307  . prismasol BGK 2/2.5 replacement solution 200 mL/hr at 10/28/20 1525    I/O last 3 completed shifts: In: 3299.9 [P.O.:1974; I.V.:965.9; Blood:360] Out: 2460 [Other:2460]   CBC Latest Ref Rng & Units 10/28/2020 10/27/2020 10/27/2020  WBC 4.0 - 10.5 K/uL 14.5(H) - 17.1(H)  Hemoglobin 13.0 - 17.0 g/dL 9.2(L) 9.5(L) 7.8(L)  Hematocrit 39.0 - 52.0 % 29.9(L) 30.6(L) 25.8(L)  Platelets 150 - 400 K/uL 325 - 348    BMP Latest Ref Rng & Units 10/28/2020 10/27/2020 10/27/2020  Glucose 70 - 99 mg/dL 144(H) 209(H) 259(H)  BUN 6 - 20 mg/dL 33(H) 47(H) 43(H)  Creatinine 0.61 - 1.24 mg/dL 2.54(H) 3.78(H) 3.53(H)  BUN/Creat Ratio 9 - 20 - - -  Sodium 135 - 145 mmol/L 132(L) 131(L) 129(L)  Potassium 3.5 - 5.1 mmol/L 4.8 5.3(H) 5.9(H)  Chloride 98 - 111 mmol/L 95(L) 97(L) 95(L)  CO2 22 - 32 mmol/L 26 24 25   Calcium 8.9 - 10.3 mg/dL 8.7(L) 8.7(L) 8.7(L)    ABG    Component Value Date/Time   PHART 7.565 (H) 10/22/2020 0439   PCO2ART 27.7 (L) 10/22/2020 0439   PO2ART 99 10/22/2020 0439   HCO3 25.1 10/22/2020 0439   TCO2 26 10/22/2020 0439    ACIDBASEDEF 1.0 10/19/2020 0324   O2SAT 58.5 10/28/2020 0513       Melodie Bouillon, MD 10/28/2020 4:17 PM

## 2020-10-28 NOTE — Consult Note (Signed)
[facility] Nurse wound follow up I have ordered PT for hydrotherapy to the sacral wound, and have requested them to consider performing 6 days/week.  This is in response to the primary RN reporting to me that the patient is having increased pain to the wound, and is an effort to remove the non-viable tissue more quickly than santyl alone can do. Val Riles, RN, MSN, CWOCN, CNS-BC, pager [phone]

## 2020-10-28 NOTE — Progress Notes (Signed)
Patient ID: Marcus Bautch., male   DOB: 1960/09/29, 60 y.o.   MRN: [id number] HeartMate 3 Rounding Note  Subjective:    Feels better this am although he did not sleep well due to interruptions.  Epi 2, co-ox 58.5  CVP 10 this am Remains on CRRT. I/O +219 cc yesterday. Aiming for -100cc/hr today.  Main complaint if pain from stage 2 sacral pressure sore. Receiving daily dressing changes.  LVAD INTERROGATION:  HeartMate IIl LVAD:  Flow 4.7 liters/min, speed 5300, power 4.1, PI 3.5.  No PI events  Objective:    Vital Signs:   Temp:  [97.6 F (36.4 C)-98.1 F (36.7 C)] 97.6 F (36.4 C) (03/25 0645) Pulse Rate:  [25-158] 122 (03/25 0700) Resp:  [8-33] 21 (03/25 0700) BP: (65-125)/(43-107) 90/65 (03/25 0500) SpO2:  [49 %-100 %] 49 % (03/25 0700) Weight:  [81.7 kg] 81.7 kg (03/25 0500) Last BM Date: 10/21/20 Mean arterial Pressure 75  Intake/Output:   Intake/Output Summary (Last 24 hours) at 10/28/2020 1048 Last data filed at 10/28/2020 1000 Gross per 24 hour  Intake 1916.68 ml  Output 3737 ml  Net -1820.32 ml     Physical Exam: General:  Good spirits,  No resp difficulty HEENT: normal Neck: right IJ dialysis catheter Cor: Distant heart sounds with LVAD hum present. Lungs: clear Chest incision healing well Abdomen: soft, nontender, nondistended. Good bowel sounds. Extremities: no edema Neuro: alert & orientedx3, cranial nerves grossly intact. moves all 4 extremities w/o difficulty. Affect pleasant  Telemetry: sinus 90's on IV amio  Labs: Basic Metabolic Panel: Recent Labs  Lab 10/24/20 1645 10/25/20 0537 10/25/20 1509 10/26/20 0418 10/26/20 1600 10/27/20 0424 10/27/20 1419 10/28/20 0512 10/28/20 0630  NA  --  132*   < > 131* 130* 129* 131*  --  132*  K  --  4.3   < > 4.7 5.8* 5.9* 5.3*  --  4.8  CL  --  99   < > 97* 96* 95* 97*  --  95*  CO2  --  24   < > 25 25 25 24   --  26  GLUCOSE  --  241*   < > 146* 269* 259* 209*  --  144*  BUN  --  25*   <  > 24* 31* 43* 47*  --  33*  CREATININE  --  1.76*   < > 1.68* 2.55* 3.53* 3.78*  --  2.54*  CALCIUM  --  8.3*   < > 8.5* 8.9 8.7* 8.7*  --  8.7*  MG 2.5* 2.5*  --  2.4  --  2.5*  --  2.4  --   PHOS  --  1.9*   < > 2.0* 2.6 2.9 4.2  --  2.6   < > = values in this interval not displayed.    Liver Function Tests: Recent Labs  Lab 10/26/20 0418 10/26/20 1600 10/27/20 0424 10/27/20 1419 10/28/20 0630  ALBUMIN 2.3* 2.4* 2.2* 2.3* 2.4*   No results for input(s): LIPASE, AMYLASE in the last 168 hours. No results for input(s): AMMONIA in the last 168 hours.  CBC: Recent Labs  Lab 10/25/20 0537 10/26/20 0418 10/26/20 1000 10/27/20 0424 10/27/20 2159 10/28/20 0512  WBC 15.7* 16.8* 17.3* 17.1*  --  14.5*  HGB 8.3* 8.1* 8.0* 7.8* 9.5* 9.2*  HCT 26.2* 26.7* 25.9* 25.8* 30.6* 29.9*  MCV 89.7 92.1 90.6 91.5  --  90.1  PLT 421* 396 370 348  --  325  INR: Recent Labs  Lab 10/24/20 0432 10/25/20 0537 10/26/20 0418 10/27/20 0424 10/28/20 0512  INR 1.9* 2.0* 2.0* 2.2* 2.1*    Other results:  EKG:   Imaging: DG CHEST PORT 1 VIEW  Result Date: 10/27/2020 CLINICAL DATA:  Fever. EXAM: PORTABLE CHEST 1 VIEW COMPARISON:  October 23, 2020. FINDINGS: Stable cardiomegaly. Left ventricular assist device is unchanged. Status post coronary bypass graft. Left-sided pacemaker is unchanged. Stable position of right internal jugular catheter. Feeding tube has been removed. No pneumothorax is noted. Right lung is clear. Mild left basilar subsegmental atelectasis may be present. Bony thorax is unremarkable. IMPRESSION: Stable cardiomegaly. Left ventricular assist device is unchanged. Mild left basilar subsegmental atelectasis may be present. Electronically Signed   By: Marijo Conception M.D.   On: 10/27/2020 08:20      Medications:     Scheduled Medications: . aspirin EC  81 mg Oral Daily  . B-complex with vitamin C  1 tablet Oral Daily  . Chlorhexidine Gluconate Cloth  6 each Topical Daily   . collagenase   Topical Daily  . feeding supplement  237 mL Oral TID BM  . gabapentin  100 mg Oral QHS  . insulin aspart  0-20 Units Subcutaneous Q4H  . insulin aspart  10 Units Subcutaneous TID WC  . insulin glargine  25 Units Subcutaneous BID  . lidocaine  1 patch Transdermal Q24H  . mouth rinse  15 mL Mouth Rinse BID  . metoCLOPramide (REGLAN) injection  5 mg Intravenous Q12H  . midodrine  15 mg Oral TID WC  . pantoprazole  40 mg Oral Daily  . polyethylene glycol  17 g Oral Daily  . senna-docusate  2 tablet Oral BID  . sodium chloride flush  10-40 mL Intracatheter Q12H  . sorbitol  30 mL Oral Daily  . Warfarin - Pharmacist Dosing Inpatient   Does not apply q1600     Infusions: . amiodarone 30 mg/hr (10/28/20 0900)  . epinephrine 1 mcg/min (10/28/20 0929)  . lactated ringers    . lactated ringers Stopped (10/17/20 1655)  . prismasol BGK 2/2.5 dialysis solution 1,500 mL/hr at 10/28/20 1017  . prismasol BGK 2/2.5 replacement solution 500 mL/hr at 10/27/20 1307  . prismasol BGK 2/2.5 replacement solution 200 mL/hr at 10/28/20 1018     PRN Medications:  dextrose, dextrose, heparin, heparin, morphine injection, ondansetron (ZOFRAN) IV, oxyCODONE, sodium chloride flush, traMADol, white petrolatum   Assessment:   1. Acute on chronic systolic congestive heart failure due to ischemic cardiomyopathy with ejection fraction of 20 to 25% admitted with class IIIb symptoms. [id number] LVAD.  2. CAD s/p CABG 2002 with patent grafts on last cath.  3. Stage 3 CKD due to diabetic nephropathy and cardiorenal syndrome. Baseline recently 1.8.CRRT postop due to preexisting renal dysfunction complicated by AKI from CP bypass run, lower BP, etc. Transition to intermittent HD per nephrology. 4. Atrial fib/flutter:Sinus alternating with AFon amio IV.  5. Right brachial plexus stretch from preop Impella and hematoma around the axillary artery. This should improve with time.He has pain in  shoulder and numbness in hand. PT/OT 6. Expected acute postop blood loss anemia.Hgbstable. 7. Stage 2 sacral pressure sore. Continue dressings daily, turn frequently and observe.  8. DM: glucose underfaircontrol on Levemir and SSI. 9.  Eating well and says he is drinking two Ensure per day.  Plan/Discussion:    Overall he is making continued slow progress. May be able to get off epi today.  Transition to intermittent HD  per nephrology. Unclear if kidneys will recover any significant function. Continue nutrition, rehab therapies. Will need CIR at some point. INR therapeutic on Coumadin.    Length of Stay: [address]  Fernande Boyden [facility] 10/28/2020, 10:48 AM

## 2020-10-28 NOTE — Progress Notes (Signed)
Occupational Therapy Treatment Patient Details Name: Marcus Johnson. MRN: [id number] DOB: [birth year] Today's Date: 10/28/2020    History of present illness 60 y.o. male admitted 2/28 with ICM for LVAD workup. Impella placed 3/1. Heartmate 3 LVAD placed 3/10. CRRT started 3/15. CRRT held 3/23-3/24 then restarted. PMHx: DM2, CAD s/p CABG 4098, systolic HF due to ischemic cardiomyopathy with EF 20-25% (echo 12/15), DM2 and CKD. He is s/p Medtronic single chamber ICD. Covid 1/22.   OT comments  Focus of session on fabricating buddy strap and thumb loop to improve functional pinch pattern R hand. Pt participated in [place] and was able to help feed himself using his R hand with strap system intact. Pictures of correct positioning and information placed in room/nsg educated. Activities for pt to work on with nsg placed in bin. Music used during session to encourage functional movement patterns. Apparent asterixis interfering at times with movement patterns. Pt progressing. Will continue to follow acutely.  Follow Up Recommendations  CIR;Supervision/Assistance - 24 hour    Equipment Recommendations  Other (comment)    Recommendations for Other Services Rehab consult    Precautions / Restrictions Precautions Precautions: Fall;Sternal;Other (comment) Precaution Booklet Issued: Yes (comment) Precaution Comments: LVAD, presyncope, CRRT Required Braces or Orthoses: Other Brace Other Brace: thumb loop and buddy strap R hand       Mobility Bed Mobility                    Transfers                      Balance                                           ADL either performed or assessed with clinical judgement   ADL   Eating/Feeding: Supervision/ safety;Set up;Sitting Eating/Feeding Details (indicate cue type and reason): using red tubing with R hand to feed self @ 5 bites                                          Vision       Perception     Praxis      Cognition Arousal/Alertness: Awake/alert Behavior During Therapy: WFL for tasks assessed/performed Overall Cognitive Status: Impaired/Different from baseline Area of Impairment: Memory;Problem solving;Awareness                   Current Attention Level: Selective Memory: Decreased short-term memory Following Commands: Follows one step commands consistently   Awareness: Emergent Problem Solving: Slow processing          Exercises General Exercises - Upper Extremity Shoulder Flexion: Both;10 reps;Supine;Seated Shoulder ABduction: AROM;AAROM;Both;10 reps;Seated Elbow Flexion: AROM;Right;10 reps;Seated Elbow Extension: AROM;Right;10 reps;Seated Wrist Flexion: AROM;AAROM;Right;10 reps;Seated Wrist Extension: AROM;Right;5 reps;Seated Digit Composite Flexion: AROM;AAROM;Right;20 reps;Seated;Squeeze ball;Other (comment) Composite Extension: AROM;Right;10 reps;Seated Other Exercises Other Exercises: pinch/grip strengthening x 10 Other Exercises: thumb strap used to increase opposition; index finger buddy taped to middle finger to improve functional position for pinch. pt worked on using R hand as functional assist while unscrewing top to simulate connector   Shoulder Instructions       General Comments Buddy strap made in addition to thumb loop for R hand due ot H and weakness especially  in [place] n distribution. Nsg educated on how to donn straps for functional use R hand/UE. Educated nsg on activities to use with pt to work on improving R hand use. Items placed in bin on shelf in room. Written information, including picutes of straps, placed in room.    Pertinent Vitals/ Pain       Pain Assessment: Faces Faces Pain Scale: Hurts little more Pain Location:  (butt/sacrum) Pain Descriptors / Indicators: Discomfort;Grimacing;Aching Pain Intervention(s): Limited activity within patient's tolerance  Home Living                                           Prior Functioning/Environment              Frequency  Min 2X/week        Progress Toward Goals  OT Goals(current goals can now be found in the care plan section)  Progress towards OT goals: Progressing toward goals  Acute Rehab OT Goals Patient Stated Goal: to go home and be with his grandchildren OT Goal Formulation: With patient Time For Goal Achievement: 10/31/20 Potential to Achieve Goals: Good ADL Goals Pt Will Perform Eating: with set-up;with adaptive utensils Pt Will Perform Grooming: with set-up;sitting;with adaptive equipment Pt Will Transfer to Toilet: with min guard assist;ambulating Pt/caregiver will Perform Home Exercise Program: Increased ROM;Increased strength;Both right and left upper extremity;With minimal assist;With written HEP provided Additional ADL Goal #1: Pt will be able to recall sternal precautions Additional ADL Goal #2: Pt will be able to change out his lines from battery<>wall with min A  Plan Discharge plan remains appropriate    Co-evaluation                 AM-PAC OT "6 Clicks" Daily Activity     Outcome Measure   Help from another person eating meals?: A Little Help from another person taking care of personal grooming?: A Little Help from another person toileting, which includes using toliet, bedpan, or urinal?: Total Help from another person bathing (including washing, rinsing, drying)?: Total Help from another person to put on and taking off regular upper body clothing?: Total Help from another person to put on and taking off regular lower body clothing?: Total 6 Click Score: 10    End of Session    OT Visit Diagnosis: Unsteadiness on feet (R26.81);Other abnormalities of gait and mobility (R26.89);Muscle weakness (generalized) (M62.81);Other symptoms and signs involving cognitive function;Dizziness and giddiness (R42) Pain - Right/Left: Right Pain - part of body: Arm   Activity  Tolerance Patient tolerated treatment well   Patient Left in bed;with call bell/phone within reach;with nursing/sitter in room   Nurse Communication Other (comment) (use of buddy strap and thumb loop)        Time: 2080-2233 OT Time Calculation (min): 60 min  Charges: OT General Charges $OT Visit: 1 Visit OT Treatments $Self Care/Home Management : 8-22 mins $Therapeutic Activity: 23-37 mins $Therapeutic Exercise: 8-22 mins  Maurie Boettcher, OT/L   Acute OT Clinical Specialist Acute Rehabilitation Services Pager [phone] Office [phone]    [facility] 10/28/2020, 5:58 PM

## 2020-10-28 NOTE — Progress Notes (Signed)
Physical Therapy Treatment Patient Details Name: Marcus Johnson. MRN: [id number] DOB: 01/23/1961 Today's Date: 10/28/2020    History of Present Illness 60 y.o. male admitted 2/28 with ICM for LVAD workup. Impella placed 3/1. Heartmate 3 LVAD placed 3/10. CRRT started 3/15. CRRT held 3/23-3/24 then restarted. PMHx: DM2, CAD s/p CABG 8850, systolic HF due to ischemic cardiomyopathy with EF 20-25% (echo 12/15), DM2 and CKD. He is s/p Medtronic single chamber ICD. Covid 1/22.    PT Comments    Pt pleasant and reports eating well this am. Pt with improved cognition and increased standing tolerance. Pt stood x 4 trials, performed pivot and 2 episodes of gait in front of chair. Pt with continued improvement of transfers, gait and awareness of precautions. Pt continues to need assist to grasp and manage power lines for transition due to weakness in RUE. Pt pleased with progression and continues to have sacral pain with education for limited sitting and pressure relief due to sacral wound. Will continue to progress mobility.  HR 83-88 SpO2 97% on RA Speed 5300, flow 4.9-5.0, PI 2.7-2.4, power 3.8    Follow Up Recommendations  Supervision/Assistance - 24 hour;CIR     Equipment Recommendations  Other (comment) (TBD)    Recommendations for Other Services       Precautions / Restrictions Precautions Precautions: Fall;Sternal;Other (comment) Precaution Comments: LVAD, presyncope Restrictions Weight Bearing Restrictions: Yes    Mobility  Bed Mobility Overal bed mobility: Needs Assistance Bed Mobility: Rolling;Sidelying to Sit Rolling: Min assist Sidelying to sit: Min assist       General bed mobility comments: cues for sequence with assist to clear legs and elevate trunk into sitting    Transfers Overall transfer level: Needs assistance   Transfers: Sit to/from Stand Sit to Stand: Min guard;+2 safety/equipment Stand pivot transfers: Min assist;+2 safety/equipment        General transfer comment: pt with good hand placement on thighs and able to stand from recliner and bed with guarding and +2 for safety. Pivoted bed to recliner with bil UE support on therapist  Ambulation/Gait Ambulation/Gait assistance: Min assist;+2 safety/equipment Gait Distance (Feet): 15 Feet Assistive device: 1 person hand held assist Gait Pattern/deviations: Step-through pattern;Decreased stride length     General Gait Details: pt with face to face technique holding onto therapist arms with pt able to step 5 steps forward and back at chair then 2nd trial of 3 reps of stepping 6 steps forward and back at chair. +2 for lines, safety and CRRT with pt having presyncope with 1st ambulation trial but not second   Chief Strategy Officer    Modified Rankin (Stroke Patients Only)       Balance Overall balance assessment: Needs assistance Sitting-balance support: No upper extremity supported;Feet supported Sitting balance-Leahy Scale: Fair     Standing balance support: Bilateral upper extremity supported Standing balance-Leahy Scale: Poor Standing balance comment: UE support for stepping                            Cognition Arousal/Alertness: Awake/alert Behavior During Therapy: WFL for tasks assessed/performed Overall Cognitive Status: Impaired/Different from baseline Area of Impairment: Memory;Problem solving;Awareness                   Current Attention Level: Selective Memory: Decreased short-term memory;Decreased recall of precautions Following Commands: Follows one step commands consistently  Problem Solving: Slow processing General Comments: pt able to recall 3/4 precautions and begin to recall process for power transition      Exercises General Exercises - Lower Extremity Long Arc Quad: Both;20 reps;Seated;AROM    General Comments        Pertinent Vitals/Pain Pain Score: 6  Pain Location: sacrum Pain  Descriptors / Indicators: Discomfort;Grimacing;Aching Pain Intervention(s): Limited activity within patient's tolerance;Monitored during session;Repositioned    Home Living                      Prior Function            PT Goals (current goals can now be found in the care plan section) Progress towards PT goals: Progressing toward goals    Frequency    Min 4X/week      PT Plan Current plan remains appropriate    Co-evaluation              AM-PAC PT "6 Clicks" Mobility   Outcome Measure  Help needed turning from your back to your side while in a flat bed without using bedrails?: A Little Help needed moving from lying on your back to sitting on the side of a flat bed without using bedrails?: A Little Help needed moving to and from a bed to a chair (including a wheelchair)?: A Lot Help needed standing up from a chair using your arms (e.g., wheelchair or bedside chair)?: A Little Help needed to walk in hospital room?: A Lot Help needed climbing 3-5 steps with a railing? : Total 6 Click Score: 14    End of Session Equipment Utilized During Treatment: Gait belt Activity Tolerance: Patient tolerated treatment well Patient left: in chair;with call bell/phone within reach;with nursing/sitter in room Nurse Communication: Precautions;Mobility status PT Visit Diagnosis: Other abnormalities of gait and mobility (R26.89);Muscle weakness (generalized) (M62.81);Difficulty in walking, not elsewhere classified (R26.2);Pain     Time: 2426-8341 PT Time Calculation (min) (ACUTE ONLY): 25 min  Charges:  $Gait Training: 8-22 mins $Therapeutic Activity: 8-22 mins                     Higinio Grow P, PT Acute Rehabilitation Services Pager: [phone] Office: [place] 10/28/2020, 11:03 AM

## 2020-10-28 NOTE — Progress Notes (Signed)
Upon standing for morning weight and transfer to chair, patient became dizzy and loss of balance. Patient placed back into bed. Vital signs stable. Patient stated "I didn't eat much yesterday."

## 2020-10-28 NOTE — Progress Notes (Addendum)
Patient ID: Marcus Detty., male   DOB: [birth year], 60 y.o.   MRN: [id number]     Advanced Heart Failure Rounding Note  PCP-Cardiologist: No primary care provider on file.   Subjective:    - 2/27 Milrinone switched to dobutamine 5 mcg.  - 2/28 Moved to ICU. Dobutamine increased to 7.5 mcg and Norepi 3 mcg added. Diuretics held.  - 3/1 Impella 5.5 placed.  - 3/4 Teeth removed - 3/10 HM3 LVAD placed -3/13 VAD speed increased to 5500. [name] out  - 3/14 Nephrology consulted. Given 1UPRBCs  - 3/15 Started CRRT. Given 1UPRBCs.  - 3/23 VAD Speed turned down to 5300  - 3/23 CRRT and Milrinone Stopped. Back in Afib -3/24 CRRT restarted.   INR up to 2.1 LDH 306  Blood Cx- NGTD.   BMET pending.   Hungry. Had some dizziness this morning.   Echo: EF 20-25%, normal RV, PASP 61, dilated IVC.   LVAD Interrogation HM 3: Speed: 5300 Flow: 4.7   PI: 3.8    Power: 4. no PI events  Objective:   Weight Range: 81.7 kg Body mass index is 25.48 kg/m.   Vital Signs:   Temp:  [97.6 F (36.4 C)-98.1 F (36.7 C)] 97.6 F (36.4 C) (03/25 0645) Pulse Rate:  [25-158] 122 (03/25 0700) Resp:  [8-33] 21 (03/25 0700) BP: (65-125)/(43-107) 90/65 (03/25 0500) SpO2:  [49 %-100 %] 49 % (03/25 0700) Weight:  [81.7 kg] 81.7 kg (03/25 0500) Last BM Date: 10/21/20  Weight change: Filed Weights   10/26/20 0641 10/27/20 0500 10/28/20 0500  Weight: 80.4 kg 83.1 kg 81.7 kg    Intake/Output:   Intake/Output Summary (Last 24 hours) at 10/28/2020 0715 Last data filed at 10/28/2020 0700 Gross per 24 hour  Intake 1998.72 ml  Output 2460 ml  Net -461.28 ml      Physical Exam  Maps 75 CVP 9-10  Physical Exam: GENERAL: No acute distress. In bed HEENT: normal  NECK: Supple, JVP  .  2+ bilaterally, no bruits.  No lymphadenopathy or thyromegaly appreciated.   CARDIAC:  Mechanical heart sounds with LVAD hum present. R HD cath. Sternal incision approximated.  LUNGS:  Clear to auscultation  bilaterally.  ABDOMEN:  Soft, round, nontender, positive bowel sounds x4.     LVAD exit site: well-healed and incorporated.  Dressing dry and intact.  No erythema or drainage.  Stabilization device present and accurately applied.  Driveline dressing is being changed daily per sterile technique. EXTREMITIES:  Warm and dry, no cyanosis, clubbing, rash or edema  NEUROLOGIC:  Alert and oriented x 3.    No aphasia.  No dysarthria.  Affect pleasant.    SKIN: Pressure Ulcer.    Telemetry   Paced in the 80s with occasional PVCs.    Labs    CBC Recent Labs    10/27/20 0424 10/27/20 2159 10/28/20 0512  WBC 17.1*  --  14.5*  HGB 7.8* 9.5* 9.2*  HCT 25.8* 30.6* 29.9*  MCV 91.5  --  90.1  PLT 348  --  347   Basic Metabolic Panel Recent Labs    10/27/20 0424 10/27/20 1419 10/28/20 0512  NA 129* 131*  --   K 5.9* 5.3*  --   CL 95* 97*  --   CO2 25 24  --   GLUCOSE 259* 209*  --   BUN 43* 47*  --   CREATININE 3.53* 3.78*  --   CALCIUM 8.7* 8.7*  --   MG 2.5*  --  2.4  PHOS 2.9 4.2  --    Liver Function Tests Recent Labs    10/27/20 0424 10/27/20 1419  ALBUMIN 2.2* 2.3*   No results for input(s): LIPASE, AMYLASE in the last 72 hours. Cardiac Enzymes No results for input(s): CKTOTAL, CKMB, CKMBINDEX, TROPONINI in the last 72 hours.  BNP: BNP (last 3 results) Recent Labs    10/14/20 0254 10/20/20 0305 10/27/20 0020  BNP 1,097.8* 466.7* 774.9*    ProBNP (last 3 results) No results for input(s): PROBNP in the last 8760 hours.   D-Dimer No results for input(s): DDIMER in the last 72 hours. Hemoglobin A1C No results for input(s): HGBA1C in the last 72 hours. Fasting Lipid Panel No results for input(s): CHOL, HDL, LDLCALC, TRIG, CHOLHDL, LDLDIRECT in the last 72 hours. Thyroid Function Tests No results for input(s): TSH, T4TOTAL, T3FREE, THYROIDAB in the last 72 hours.  Invalid input(s): FREET3  Other results:   Imaging    DG CHEST PORT 1 VIEW  Result  Date: 10/27/2020 CLINICAL DATA:  Fever. EXAM: PORTABLE CHEST 1 VIEW COMPARISON:  October 23, 2020. FINDINGS: Stable cardiomegaly. Left ventricular assist device is unchanged. Status post coronary bypass graft. Left-sided pacemaker is unchanged. Stable position of right internal jugular catheter. Feeding tube has been removed. No pneumothorax is noted. Right lung is clear. Mild left basilar subsegmental atelectasis may be present. Bony thorax is unremarkable. IMPRESSION: Stable cardiomegaly. Left ventricular assist device is unchanged. Mild left basilar subsegmental atelectasis may be present. Electronically Signed   By: Marijo Conception M.D.   On: 10/27/2020 08:20     Medications:     Scheduled Medications: . aspirin EC  81 mg Oral Daily  . B-complex with vitamin C  1 tablet Oral Daily  . Chlorhexidine Gluconate Cloth  6 each Topical Daily  . collagenase   Topical Daily  . feeding supplement  237 mL Oral TID BM  . gabapentin  100 mg Oral QHS  . insulin aspart  0-20 Units Subcutaneous Q4H  . insulin aspart  10 Units Subcutaneous TID WC  . insulin glargine  25 Units Subcutaneous BID  . lidocaine  1 patch Transdermal Q24H  . mouth rinse  15 mL Mouth Rinse BID  . metoCLOPramide (REGLAN) injection  5 mg Intravenous Q12H  . midodrine  15 mg Oral TID WC  . pantoprazole  40 mg Oral Daily  . polyethylene glycol  17 g Oral Daily  . senna-docusate  2 tablet Oral BID  . sodium chloride flush  10-40 mL Intracatheter Q12H  . sorbitol  30 mL Oral Daily  . Warfarin - Pharmacist Dosing Inpatient   Does not apply q1600    Infusions: . amiodarone 30 mg/hr (10/28/20 0700)  . epinephrine 2 mcg/min (10/28/20 0700)  . lactated ringers    . lactated ringers Stopped (10/17/20 1655)  . prismasol BGK 2/2.5 dialysis solution 1,500 mL/hr at 10/28/20 0606  . prismasol BGK 2/2.5 replacement solution 500 mL/hr at 10/27/20 1307  . prismasol BGK 2/2.5 replacement solution 200 mL/hr at 10/27/20 1308    PRN  Medications: dextrose, dextrose, heparin, heparin, morphine injection, ondansetron (ZOFRAN) IV, oxyCODONE, sodium chloride flush, traMADol, white petrolatum    Assessment/Plan   1.Acute on chronic systolic HF -> cardiogenic shock:ICM.HasMedtronic ICD. EF 15%. Low output persisted despite milrinone and then dobutamine with rise in creatinine to 2.5. Impella 5.5 placed 3/1.  Creatinine improved to 1.7 and HM3 LVAD was placed on 3/10. Developed post-op renal failure requiring CVVH. Now off  milrinone. VAD speed decreased to 5300. CRRT stopped 3/23 but restarted 3/24.  Pulling 100/hour. No urine output.  -ON 2 mcg Epi. CO-OX 58.5%.   - Continue midodrine 15 mg tid.  2. LVAD: 3/13 VAD speed increased to 5500. 3/23 speed decreased to 5300. VAD interrogated personally. Parameters stable.  LDH stable at 306 - INR 2.1 today on warfarin, on ASA 81 daily. - DL site ok 3. CAD s/p CABG2002:Last cath in 6/19 with patent grafts.  - Continue Crestor 40 mg daily.  4. AKI on CKD Stage 3b: Suspect that this is a combination of cardiorenal syndrome and diabetic nephropathy and possibly ATN. Developed post-op AKI. Started on CRRT 3/15. CVVH stopped 3/23 but restarted 3/24. No urine output. CVP down to 9.  - Nephrology appreciated.  5. Atrial flutter/fibrillation: s/p DC-CV 08/21/18. Back in Afib w/ RVR yesterday. Restarted on amio gtt. Sinus tach this morning.   - continue amio gtt at 30/hr  6. Wound R Foot: Partial thickness skin loss noted. Cover with mepilex border and change every 5 days.  7. Right arm pain: Think neuropathic, per Dr. Cyndia Bent patient had stretch of brachial plexus with Impella placement.  8. ID:  WBC 14K. Afebrile. Blood Cx- NGTD.  9. NI:DPOEUMP of very poor control but hgbA1ctrending down. Most recent 7.7%.  Insulin drip stopped 3/24.  Now on lantus + meal coverage.  - Diabetes coordinators.  10. Anemia: hgb up to 9.2 with 1 unit yesterday.  11. Deconditioning - PT/OT following   - will need CIR once medically stable  12. Stage 2 sacral decubitus ulcer: Wound care. Continue reposition every 2 hours.  13. Hyperkalemia:  - BMET pending.    Length of Stay: [place], NP  10/28/2020, 7:15 AM  Advanced Heart Failure Team Pager [phone] (M-F; [place])  Please contact [facility] for night-coverage after hours (4p -7a ) and weekends on [email or web address]  Patient seen with PA, agree with the above note.   CVVH restarted, minimal UOP.  Pulling UF -100 cc/hr.  CVP 9-10 this morning.  Co-ox 59%, on amiodarone gtt and epinephrine 2.   Up to chair yesterday, ate breakfast this morning.  Feels better today, still pain from decubitus ulcer.   General: Well appearing this am. NAD.  HEENT: Normal. Neck: Supple, JVP 8-9 cm. Carotids OK.  Cardiac:  Mechanical heart sounds with LVAD hum present.  Lungs:  CTAB, normal effort.  Abdomen:  NT, ND, no HSM. No bruits or masses. +BS  LVAD exit site: Well-healed and incorporated. Dressing dry and intact. No erythema or drainage. Stabilization device present and accurately applied. Driveline dressing changed daily per sterile technique. Extremities:  Warm and dry. No cyanosis, clubbing, rash, or edema.  Neuro:  Alert & oriented x 3. Cranial nerves grossly intact. Moves all 4 extremities w/o difficulty. Affect pleasant    Volume improved on CVVH, weight down 3 lbs.  Continue current UF, transition over to intermittent HD when nephrology feels appropriate.   Decrease epinephrine to 1. Hopefully stop later today.  Transition to po amiodarone after epinephrine off.    Still with pain from decubitus ulcer.  Continue to mobilize.   CRITICAL CARE Performed by: Loralie Champagne  Total critical care time: 35 minutes  Critical care time was exclusive of separately billable procedures and treating other patients.  Critical care was necessary to treat or prevent imminent or life-threatening deterioration.  Critical care was time  spent personally by me on the following activities: development  of treatment plan with patient and/or surrogate as well as nursing, discussions with consultants, evaluation of patient's response to treatment, examination of patient, obtaining history from patient or surrogate, ordering and performing treatments and interventions, ordering and review of laboratory studies, ordering and review of radiographic studies, pulse oximetry and re-evaluation of patient's condition.  Loralie Champagne 10/28/2020 7:57 AM

## 2020-10-28 NOTE — Progress Notes (Signed)
[facility] NEPHROLOGY PROGRESS NOTE  Assessment/ Plan:  Acute kidney injury on chronic kidney disease stage IIIa: With underlying chronic kidney disease likely from diabetes/hypertension and worsening renal function from hemodynamically mediated ATN versus cardiorenal syndrome.   -CRRT restarted 3/24. Will continue with CRRT, goal: net neg 50-100cc/hr  Hyperkalemia -secondary to AKI, restarted cvvhdf as above, K improved   Acute exacerbation of chronic systolic heart failure with cardiogenic shock: status post LVAD. Remains on pressors with epinephrine.  Volume status/UF managed with CRRT.  Anemia:Likely secondary to chronic disease and exacerbated by recent surgical losses.  Transfuse PRBC as needed. hgb stable  Atrial fibrillation: per cardiology  Hypophosphatemia: replete prn  Leukocytosis: stable, w/u per primary  Protein calorie malnutrition, hypoalbuminemia: push protein, per primary  Gean Quint, MD [facility]  Subjective: Seen and examined in ICU.  No acute events. Tolerating CRRT, epi down to 2. Anuric. No complaitns today, eating well. Objective Vital signs in last 24 hours: Vitals:   10/28/20 0645 10/28/20 0700 10/28/20 0800 10/28/20 1100  BP:      Pulse:  (!) 122    Resp:  (!) 21    Temp: 97.6 F (36.4 C)   97.6 F (36.4 C)  TempSrc: Oral  Oral   SpO2:  (!) 49%    Weight:      Height:       Weight change: -1.4 kg  Intake/Output Summary (Last 24 hours) at 10/28/2020 1124 Last data filed at 10/28/2020 1100 Gross per 24 hour  Intake 2122.87 ml  Output 4014 ml  Net -1891.13 ml       Labs: Basic Metabolic Panel: Recent Labs  Lab 10/27/20 0424 10/27/20 1419 10/28/20 0630  NA 129* 131* 132*  K 5.9* 5.3* 4.8  CL 95* 97* 95*  CO2 25 24 26   GLUCOSE 259* 209* 144*  BUN 43* 47* 33*  CREATININE 3.53* 3.78* 2.54*  CALCIUM 8.7* 8.7* 8.7*  PHOS 2.9 4.2 2.6   Liver Function Tests: Recent Labs  Lab 10/27/20 0424  10/27/20 1419 10/28/20 0630  ALBUMIN 2.2* 2.3* 2.4*   No results for input(s): LIPASE, AMYLASE in the last 168 hours. No results for input(s): AMMONIA in the last 168 hours. CBC: Recent Labs  Lab 10/25/20 0537 10/26/20 0418 10/26/20 1000 10/27/20 0424 10/27/20 2159 10/28/20 0512  WBC 15.7* 16.8* 17.3* 17.1*  --  14.5*  HGB 8.3* 8.1* 8.0* 7.8* 9.5* 9.2*  HCT 26.2* 26.7* 25.9* 25.8* 30.6* 29.9*  MCV 89.7 92.1 90.6 91.5  --  90.1  PLT 421* 396 370 348  --  325   Cardiac Enzymes: No results for input(s): CKTOTAL, CKMB, CKMBINDEX, TROPONINI in the last 168 hours. CBG: Recent Labs  Lab 10/27/20 2159 10/27/20 2335 10/28/20 0445 10/28/20 0647 10/28/20 1112  GLUCAP 233* 228* 130* 98 196*    Iron Studies: No results for input(s): IRON, TIBC, TRANSFERRIN, FERRITIN in the last 72 hours. Studies/Results: DG CHEST PORT 1 VIEW  Result Date: 10/27/2020 CLINICAL DATA:  Fever. EXAM: PORTABLE CHEST 1 VIEW COMPARISON:  October 23, 2020. FINDINGS: Stable cardiomegaly. Left ventricular assist device is unchanged. Status post coronary bypass graft. Left-sided pacemaker is unchanged. Stable position of right internal jugular catheter. Feeding tube has been removed. No pneumothorax is noted. Right lung is clear. Mild left basilar subsegmental atelectasis may be present. Bony thorax is unremarkable. IMPRESSION: Stable cardiomegaly. Left ventricular assist device is unchanged. Mild left basilar subsegmental atelectasis may be present. Electronically Signed   By: Jeneen Rinks  Murlean Caller M.D.   On: 10/27/2020 08:20    Medications: Infusions: . amiodarone 30 mg/hr (10/28/20 1100)  . epinephrine 1 mcg/min (10/28/20 1100)  . lactated ringers    . lactated ringers Stopped (10/17/20 1655)  . prismasol BGK 2/2.5 dialysis solution 1,500 mL/hr at 10/28/20 1017  . prismasol BGK 2/2.5 replacement solution 500 mL/hr at 10/27/20 1307  . prismasol BGK 2/2.5 replacement solution 200 mL/hr at 10/28/20 1018     Scheduled Medications: . aspirin EC  81 mg Oral Daily  . B-complex with vitamin C  1 tablet Oral Daily  . Chlorhexidine Gluconate Cloth  6 each Topical Daily  . collagenase   Topical Daily  . feeding supplement  237 mL Oral TID BM  . gabapentin  100 mg Oral QHS  . insulin aspart  0-20 Units Subcutaneous Q4H  . insulin aspart  10 Units Subcutaneous TID WC  . insulin glargine  25 Units Subcutaneous BID  . lidocaine  1 patch Transdermal Q24H  . mouth rinse  15 mL Mouth Rinse BID  . metoCLOPramide (REGLAN) injection  5 mg Intravenous Q12H  . midodrine  15 mg Oral TID WC  . pantoprazole  40 mg Oral Daily  . polyethylene glycol  17 g Oral BID  . senna-docusate  2 tablet Oral BID  . sodium chloride flush  10-40 mL Intracatheter Q12H  . sorbitol  30 mL Oral Daily  . warfarin  3 mg Oral ONCE-1600  . Warfarin - Pharmacist Dosing Inpatient   Does not apply q1600    have reviewed scheduled and prn medications.  Physical Exam: General: NAD, sitting up in chair Heart: Mechanical heart sound/lvad hum Lungs: cta bl Abdomen:soft, Non-tender, non-distended Extremities: trace pitting edema bl Dialysis Access: Temporary RIJ HD catheter.  Keniya Schlotterbeck 10/28/2020,11:24 AM  LOS: 30 days

## 2020-10-28 NOTE — Progress Notes (Signed)
CSW met at bedside with patient and VAD Coordinator. Patient spoke about his recovery and was concerned about weakness in his legs. Patient states he has been OOB to chair and continues with dialysis. CSW provided supportive intervention and encouraged patient with is recovery. Patient appeared down but continues to be motivated. CSW continues to support patient though recovery. Jackie Brennan, LCSW, CCSW-MCS [phone]  

## 2020-10-28 NOTE — Progress Notes (Addendum)
LVAD Coordinator Rounding Note:  Admitted 09/28/20 due to worsening heart failure symptoms. Recent COVID infection January 2022; did not take medications for 3-4 weeks.   S/p dental extractions 10/07/20 per Dr Benson Norway.  HM III LVAD implanted on 10/13/20 by Dr Cyndia Bent under Destination Therapy criteria.  Pt awake, sitting in chair at bedside. Dr. Cyndia Bent in room speaking with patient.  Patient reports he has been in chair @ 1.5 hrs and is c/o pain from sacral wound; pt sitting on pillow; assisted patient back to bed (2 nurse assist). While standing patient c/o "shaking" and wanted to "lay down"; explained shaking could be from weakness, verbal encouragement offered. Positioned patient on right side and off sacral area. Patient denied need for pain medication at this time.   Pt feeling much better today than yesterday. CVVH in progress pulling 100 cc/hr.   Vital signs: Temp: 97.6 HR: 85 Doppler:  88 Auto cuff: 90/65 (75) O2 Sat: 99% on RA Wt: 200.1>221.7>219.5..........212>196.6>192.4>182.7>174.8>177.2>183.2>180 lbs    LVAD interrogation reveals:  Speed: 5300 Flow: 5.1 Power: 3.8w PI: 2.2 Hematocrit: 30  Alarms: none Events: 12 PI events 10/27/20  Fixed speed: 5300 Low speed limit: 5000  Drive Line: Existing VAD dressing removed and site care performed using sterile technique. Drive line exit site cleaned with Chlora prep applicators x 2, allowed to dry, and gauze dressing with silver strip re-applied; skin tears healing. NO skin prep used due to skin intolerance of same. Exit site healing and unincorporated the velour is fully implanted at exit site. There is 1 suture visible that is wrapped around the driveline. Small amount of brown drainage from fat necrosis. No redness, tenderness, foul odor or rash noted. Few inches of drive line from exit site to modular cable connection, making positioning/anchoring of drive line difficult. Drive line anchor correctly applied. Continue daily dressing  changes by VAD coordinator or nurse champion.    Labs:  LDH trend: 444>670>575>571>485>467>541>414>370>361>327>310  INR trend: 1.4>1.7>1.8>1.7>1.6>1.8>1.9>2.0>2.0>2.2  Creatinine: 1.84>3.0>3.4>2.73>2.04>1.70>1.85>1.83>1.86>1.76>1.68>2.55>3.53  Anticoagulation Plan: -INR Goal: 2.0-2.5 -ASA Dose: 325 mg until INR therapeutic  Blood Products:  IntraOp:   1 Platelet  6 FFP  3 PRBC  1035 cell saver Postop:   10/13/20:   4 FFP  4 PRBC    10/14/20:  1 FFP  1 PRBC   10/16/20:  1 PRBC   10/18/20:  1 PRBC   10/16/20:  1 PRBC   10/18/20:  1 PRBC   10/27/20:  1 PRBC  Device: -  Medtronic single - Therapies: off   Arrythmias: VT in OR requiring defibrillation   Renal: CRRT   - started 10/19/20   - stopped 10/26/20   - re-started 10/27/20   Drips: Epinephrine 1 mcg/min Amiodarone 30 mg/hr   Infection: - 10/27/20 respiratory culture - needs to be collected - 10/27/20 blood cultures - pending  Patient Education: 1. Assisted patient back to bed. Re-enforced need to always be aware of VAD equipment before getting up, moving. Also explained importance of having controller attached to him before changing positions or moving. Pt currently wearing controller around neck and is attached to patient cable. Patient verbalized understanding of above.    Plan/Recommendations:  1. Call VAD coordinator for equipment or drive line issues 2. Daily drive line dressing change per VAD coordinator or nurse champion. Advamced tp every other day dressing changes. Next dressing change due 10/30/20  Zada Girt RN [place] Coordinator  Office: [phone]  24/7 Pager: [phone]

## 2020-10-28 NOTE — Progress Notes (Signed)
ANTICOAGULATION CONSULT NOTE - Follow Up Consult  Pharmacy Consult for Warfarin Indication: LVAD  Allergies  Allergen Reactions  . Meclizine Hcl Anaphylaxis and Swelling  . Ivabradine Nausea Only    Patient Measurements: Height: 5' 10.5" (179.1 cm) Weight: 81.7 kg (180 lb 1.9 oz) IBW/kg (Calculated) : 74.15 Heparin Dosing Weight: 89.2 kg  Vital Signs: Temp: 97.6 F (36.4 C) (03/25 0645) Temp Source: Oral (03/25 0645) BP: 90/65 (03/25 0500) Pulse Rate: 122 (03/25 0700)  Labs: Recent Labs    10/26/20 0418 10/26/20 1000 10/26/20 1600 10/27/20 0424 10/27/20 1419 10/27/20 2159 10/28/20 0512  HGB 8.1* 8.0*  --  7.8*  --  9.5* 9.2*  HCT 26.7* 25.9*  --  25.8*  --  30.6* 29.9*  PLT 396 370  --  348  --   --  325  LABPROT 21.8*  --   --  23.3*  --   --  23.0*  INR 2.0*  --   --  2.2*  --   --  2.1*  HEPARINUNFRC <0.10*  --   --   --   --   --   --   CREATININE 1.68*  --  2.55* 3.53* 3.78*  --   --     Estimated Creatinine Clearance: 22.1 mL/min (A) (by C-G formula based on SCr of 3.78 mg/dL (H)).   Medical History: Past Medical History:  Diagnosis Date  . AICD (automatic cardioverter/defibrillator) present    Single-chamber  implantable cardiac defibrillator - Medtronic  . Atrial fibrillation ([place])   . Cataract    Mixed form OU  . CHF (congestive heart failure) ([place])   . Chronic kidney disease   . Chronic kidney disease (CKD), stage III (moderate) (HCC)   . Chronic systolic heart failure ([place])    a. Echo 5/13: Mild LVE, mild LVH, EF 10%, anteroseptal, lateral, apical AK, mild MR, mild LAE, moderately reduced RVSF, mild RAE, PASP 60;  b. 07/2014 Echo: EF 20-2%, diff HK, AKI of antsep/apical/mid-apicalinferior, mod reduced RV.  Marland Kitchen Coronary artery disease    a. s/p CABG 2002 b. LHC 5/13:  dLM 80%, LAD subtotally occluded, pCFX occluded, pRCA 50%, mid? Occlusion with high take off of the PDA with 70% multiple lesions-not bypassed and supplies collaterals to LAD,  LIMA-IM/ramus ok, S-OM ok, S-PLA branches ok. Medical therapy was recommended  . COVID   . Diabetic retinopathy (HCC)    NPDR OD, PDR OS  . Dyspnea   . Gout    "on daily RX" (01/08/2018)  . Hypertension   . Hypertensive retinopathy    OU  . Ischemic cardiomyopathy    a. Echo 5/13: Mild LVE, mild LVH, EF 10%, anteroseptal, lateral, apical AK, mild MR, mild LAE, moderately reduced RVSF, mild RAE, PASP 60;  b. 01/2012 s/p MDT [id number] Protecta XT VR AICD;  c. 07/2014 Echo: EF 20-2%, diff HK, AKI of antsep/apical/mid-apicalinferior, mod reduced RV.  Marland Kitchen MRSA (methicillin resistant Staphylococcus aureus)    Status post right foot plantar deep infection with MRSA status post  I&D 10/2008  . Myocardial infarction [facility])    "was told I'd had several before heart OR in 2002" (01/08/2018)  . Noncompliance   . Peripheral neuropathy   . Retinopathy, diabetic, background ([place])   . Syncope   . Type II diabetes mellitus ([place])    requiring insulin   . Vitreous hemorrhage, left (HCC)    and proliferative diabetic retinopathy    Assessment: 60 yo M s/p new LVAD implant with  HeartMate III on 3/10. Warfarin started per MD on 3/12, and pharmacy asked to take over warfarin dosing.   INR remains therapeutic at 2.1 today. Currently taking 2-3 Ensures daily, eating more this morning. Hgb up to 9.2 after transfusion yesterday. No overt bleeding. LDH stable 300s.   Goal of Therapy:  INR 2-2.5 Monitor platelets by anticoagulation protocol: Yes   Plan:  Warfarin 3 mg PO tonight  Daily PT/INR, CBC, monitor s/sx bleeding  [name], PharmD, BCPS PGY2 Cardiology Pharmacy Resident Phone: [phone] 10/28/2020  7:26 AM  Please check [email or web address] for unit-specific pharmacy phone numbers.

## 2020-10-28 NOTE — Progress Notes (Signed)
Epicardial pacing wires x2 d/c'd per MD order.  Pt awake, alert and oriented.  INR 2.1 per protocol.  Site unremarkable w/gauze dressing applied at site.  Vitals currently stable.   Will continue to monitor per protocol.

## 2020-10-29 DIAGNOSIS — Z95811 Presence of heart assist device: Secondary | ICD-10-CM | POA: Diagnosis not present

## 2020-10-29 DIAGNOSIS — I5023 Acute on chronic systolic (congestive) heart failure: Secondary | ICD-10-CM | POA: Diagnosis not present

## 2020-10-29 LAB — CBC
HCT: 30.9 % — ABNORMAL LOW (ref 39.0–52.0)
Hemoglobin: 9.3 g/dL — ABNORMAL LOW (ref 13.0–17.0)
MCH: 27.4 pg (ref 26.0–34.0)
MCHC: 30.1 g/dL (ref 30.0–36.0)
MCV: 90.9 fL (ref 80.0–100.0)
Platelets: 329 10*3/uL (ref 150–400)
RBC: 3.4 MIL/uL — ABNORMAL LOW (ref 4.22–5.81)
RDW: 18 % — ABNORMAL HIGH (ref 11.5–15.5)
WBC: 12.3 10*3/uL — ABNORMAL HIGH (ref 4.0–10.5)
nRBC: 0 % (ref 0.0–0.2)

## 2020-10-29 LAB — RENAL FUNCTION PANEL
Albumin: 2.4 g/dL — ABNORMAL LOW (ref 3.5–5.0)
Albumin: 2.4 g/dL — ABNORMAL LOW (ref 3.5–5.0)
Anion gap: 8 (ref 5–15)
Anion gap: 8 (ref 5–15)
BUN: 26 mg/dL — ABNORMAL HIGH (ref 6–20)
BUN: 24 mg/dL — ABNORMAL HIGH (ref 6–20)
CO2: 28 mmol/L (ref 22–32)
CO2: 28 mmol/L (ref 22–32)
Calcium: 8.7 mg/dL — ABNORMAL LOW (ref 8.9–10.3)
Calcium: 8.7 mg/dL — ABNORMAL LOW (ref 8.9–10.3)
Chloride: 96 mmol/L — ABNORMAL LOW (ref 98–111)
Chloride: 98 mmol/L (ref 98–111)
Creatinine, Ser: 2.21 mg/dL — ABNORMAL HIGH (ref 0.61–1.24)
Creatinine, Ser: 2.15 mg/dL — ABNORMAL HIGH (ref 0.61–1.24)
GFR, Estimated: 33 mL/min — ABNORMAL LOW (ref >60)
GFR, Estimated: 35 mL/min — ABNORMAL LOW (ref >60)
Glucose, Bld: 161 mg/dL — ABNORMAL HIGH (ref 70–99)
Glucose, Bld: 86 mg/dL (ref 70–99)
Phosphorus: 2.1 mg/dL — ABNORMAL LOW (ref 2.5–4.6)
Phosphorus: 2.2 mg/dL — ABNORMAL LOW (ref 2.5–4.6)
Potassium: 4.1 mmol/L (ref 3.5–5.1)
Potassium: 4.2 mmol/L (ref 3.5–5.1)
Sodium: 132 mmol/L — ABNORMAL LOW (ref 135–145)
Sodium: 134 mmol/L — ABNORMAL LOW (ref 135–145)

## 2020-10-29 LAB — GLUCOSE, CAPILLARY
Glucose-Capillary: 158 mg/dL — ABNORMAL HIGH (ref 70–99)
Glucose-Capillary: 167 mg/dL — ABNORMAL HIGH (ref 70–99)
Glucose-Capillary: 76 mg/dL (ref 70–99)
Glucose-Capillary: 85 mg/dL (ref 70–99)
Glucose-Capillary: 93 mg/dL (ref 70–99)

## 2020-10-29 LAB — LACTATE DEHYDROGENASE: LDH: 281 U/L — ABNORMAL HIGH (ref 98–192)

## 2020-10-29 LAB — COOXEMETRY PANEL
Carboxyhemoglobin: 1.8 % — ABNORMAL HIGH (ref 0.5–1.5)
Methemoglobin: 0.7 % (ref 0.0–1.5)
O2 Saturation: 59.7 %
Total hemoglobin: 11.4 g/dL — ABNORMAL LOW (ref 12.0–16.0)

## 2020-10-29 LAB — MAGNESIUM: Magnesium: 2.6 mg/dL — ABNORMAL HIGH (ref 1.7–2.4)

## 2020-10-29 LAB — PROTIME-INR
INR: 2 — ABNORMAL HIGH (ref 0.8–1.2)
Prothrombin Time: 21.7 seconds — ABNORMAL HIGH (ref 11.4–15.2)

## 2020-10-29 MED ORDER — ACETAMINOPHEN 500 MG PO TABS
1000.0000 mg | ORAL_TABLET | Freq: Four times a day (QID) | ORAL | Status: DC | PRN
  Administered 2020-10-29 – 2020-11-03 (×4): 1000 mg via ORAL
  Filled 2020-10-29 (×4): qty 2

## 2020-10-29 MED ORDER — FENTANYL CITRATE (PF) 100 MCG/2ML IJ SOLN
12.5000 ug | INTRAMUSCULAR | Status: DC | PRN
  Administered 2020-10-30 (×2): 25 ug via INTRAVENOUS
  Administered 2020-10-30: 12.5 ug via INTRAVENOUS
  Administered 2020-10-31 – 2020-11-02 (×8): 25 ug via INTRAVENOUS
  Administered 2020-11-03 (×2): 12.5 ug via INTRAVENOUS
  Administered 2020-11-04 – 2020-11-07 (×8): 25 ug via INTRAVENOUS
  Filled 2020-10-29 (×21): qty 2

## 2020-10-29 MED ORDER — OXYCODONE HCL 5 MG PO TABS
5.0000 mg | ORAL_TABLET | ORAL | Status: DC | PRN
Start: 2020-10-29 — End: 2020-11-14
  Administered 2020-11-01: 5 mg via ORAL
  Administered 2020-11-02 – 2020-11-08 (×13): 10 mg via ORAL
  Administered 2020-11-09 (×2): 5 mg via ORAL
  Administered 2020-11-10: 10 mg via ORAL
  Administered 2020-11-10: 5 mg via ORAL
  Administered 2020-11-11 – 2020-11-14 (×9): 10 mg via ORAL
  Filled 2020-10-29 (×3): qty 2
  Filled 2020-10-29: qty 1
  Filled 2020-10-29 (×3): qty 2
  Filled 2020-10-29: qty 1
  Filled 2020-10-29 (×6): qty 2
  Filled 2020-10-29: qty 1
  Filled 2020-10-29 (×11): qty 2

## 2020-10-29 MED ORDER — WARFARIN SODIUM 3 MG PO TABS
3.0000 mg | ORAL_TABLET | Freq: Once | ORAL | Status: AC
  Administered 2020-10-29: 3 mg via ORAL
  Filled 2020-10-29: qty 1

## 2020-10-29 MED ORDER — FENTANYL CITRATE (PF) 100 MCG/2ML IJ SOLN
50.0000 ug | INTRAMUSCULAR | Status: DC | PRN
  Administered 2020-10-29: 50 ug via INTRAVENOUS
  Filled 2020-10-29: qty 2

## 2020-10-29 MED ORDER — FENTANYL CITRATE (PF) 100 MCG/2ML IJ SOLN: 50.0000 ug | INTRAMUSCULAR | Status: DC | PRN

## 2020-10-29 MED ORDER — AMIODARONE HCL 200 MG PO TABS
200.0000 mg | ORAL_TABLET | Freq: Two times a day (BID) | ORAL | Status: DC
  Administered 2020-10-29 – 2020-11-04 (×14): 200 mg via ORAL
  Filled 2020-10-29 (×14): qty 1

## 2020-10-29 NOTE — Progress Notes (Signed)
ANTICOAGULATION CONSULT NOTE - Follow Up Consult  Pharmacy Consult for Warfarin Indication: LVAD  Allergies  Allergen Reactions  . Meclizine Hcl Anaphylaxis and Swelling  . Ivabradine Nausea Only    Patient Measurements: Height: 5' 10.5" (179.1 cm) Weight: 79.4 kg (175 lb 0.7 oz) IBW/kg (Calculated) : 74.15 Heparin Dosing Weight: 89.2 kg  Vital Signs: Temp: 96.8 F (36 C) (03/26 0700) Temp Source: Axillary (03/26 0700) Pulse Rate: 38 (03/26 0700)  Labs: Recent Labs    10/27/20 0424 10/27/20 1419 10/27/20 2159 10/28/20 0512 10/28/20 0630 10/28/20 1558 10/29/20 0430  HGB 7.8*  --  9.5* 9.2*  --   --  9.3*  HCT 25.8*  --  30.6* 29.9*  --   --  30.9*  PLT 348  --   --  325  --   --  329  LABPROT 23.3*  --   --  23.0*  --   --  21.7*  INR 2.2*  --   --  2.1*  --   --  2.0*  CREATININE 3.53*   < >  --   --  2.54* 2.60* 2.21*   < > = values in this interval not displayed.    Estimated Creatinine Clearance: 37.8 mL/min (A) (by C-G formula based on SCr of 2.21 mg/dL (H)).   Medical History: Past Medical History:  Diagnosis Date  . AICD (automatic cardioverter/defibrillator) present    Single-chamber  implantable cardiac defibrillator - Medtronic  . Atrial fibrillation ([place])   . Cataract    Mixed form OU  . CHF (congestive heart failure) ([place])   . Chronic kidney disease   . Chronic kidney disease (CKD), stage III (moderate) (HCC)   . Chronic systolic heart failure ([place])    a. Echo 5/13: Mild LVE, mild LVH, EF 10%, anteroseptal, lateral, apical AK, mild MR, mild LAE, moderately reduced RVSF, mild RAE, PASP 60;  b. 07/2014 Echo: EF 20-2%, diff HK, AKI of antsep/apical/mid-apicalinferior, mod reduced RV.  Marland Kitchen Coronary artery disease    a. s/p CABG 2002 b. LHC 5/13:  dLM 80%, LAD subtotally occluded, pCFX occluded, pRCA 50%, mid? Occlusion with high take off of the PDA with 70% multiple lesions-not bypassed and supplies collaterals to LAD, LIMA-IM/ramus ok, S-OM ok, S-PLA  branches ok. Medical therapy was recommended  . COVID   . Diabetic retinopathy (HCC)    NPDR OD, PDR OS  . Dyspnea   . Gout    "on daily RX" (01/08/2018)  . Hypertension   . Hypertensive retinopathy    OU  . Ischemic cardiomyopathy    a. Echo 5/13: Mild LVE, mild LVH, EF 10%, anteroseptal, lateral, apical AK, mild MR, mild LAE, moderately reduced RVSF, mild RAE, PASP 60;  b. 01/2012 s/p MDT [id number] Protecta XT VR AICD;  c. 07/2014 Echo: EF 20-2%, diff HK, AKI of antsep/apical/mid-apicalinferior, mod reduced RV.  Marland Kitchen MRSA (methicillin resistant Staphylococcus aureus)    Status post right foot plantar deep infection with MRSA status post  I&D 10/2008  . Myocardial infarction [facility])    "was told I'd had several before heart OR in 2002" (01/08/2018)  . Noncompliance   . Peripheral neuropathy   . Retinopathy, diabetic, background ([place])   . Syncope   . Type II diabetes mellitus ([place])    requiring insulin   . Vitreous hemorrhage, left (HCC)    and proliferative diabetic retinopathy    Assessment: 60 yo M s/p new LVAD implant with HeartMate III on 3/10. Warfarin started per  MD on 3/12, and pharmacy asked to take over warfarin dosing.   INR remains therapeutic at 2 today. Currently taking 2-3 Ensures daily, eating more. Hgb up to 9.2 after transfusion yesterday. No overt bleeding. LDH stable 281, Hgb stable.  Goal of Therapy:  INR 2-2.5 Monitor platelets by anticoagulation protocol: Yes   Plan:  Warfarin 3 mg PO tonight  Daily PT/INR, CBC, monitor s/sx bleeding  [place] Crane, Vena Austria, BCPS, BCCP Clinical Pharmacist  10/29/2020 8:00 AM   [facility] pharmacy phone numbers are listed on [email or web address]  Please check [email or web address] for unit-specific pharmacy phone numbers.

## 2020-10-29 NOTE — Progress Notes (Signed)
Physical Therapy Wound Treatment Patient Details  Name: Marcus Johnson. MRN: [id number] Date of Birth: 1960/12/22  Today's Date: 10/29/2020 Time: 3220-2542 Time Calculation (min): 33 min  Subjective  Subjective Assessment Subjective: Pt pleasant and agreeable Patient and Family Stated Goals: did not state Date of Onset:  (unknown) Prior Treatments: dressing change  Pain Score:  4/10  Wound Assessment  Wound Image   10/29/20 1100  Dressing Type ABD;Barrier Film (skin prep);Moist to dry;Santyl;Normal saline moist dressing 10/29/20 1100  Dressing Changed;Clean;Dry;Intact 10/29/20 1100  Dressing Change Frequency Daily 10/29/20 1100  State of Healing Early/partial granulation 10/29/20 1100  Site / Wound Assessment Yellow;Brown;Pink 10/29/20 1100  % Wound base Red or Granulating 30% 10/29/20 1100  % Wound base Yellow/Fibrinous Exudate 70% 10/29/20 1100  % Wound base Black/Eschar 0% 10/29/20 1100  % Wound base Other/Granulation Tissue (Comment) 0% 10/29/20 1100  Peri-wound Assessment Intact 10/29/20 1100  Wound Length (cm) 4 cm 10/29/20 1100  Wound Width (cm) 7 cm 10/29/20 1100  Wound Depth (cm) 0.1 cm 10/29/20 1100  Wound Surface Area (cm^2) 28 cm^2 10/29/20 1100  Wound Volume (cm^3) 2.8 cm^3 10/29/20 1100  Margins Unattached edges (unapproximated) 10/29/20 1100  Drainage Amount Scant 10/29/20 1100  Drainage Description Serous 10/29/20 1100  Treatment Debridement (Selective);Hydrotherapy (Pulse lavage);Packing (Saline gauze) 10/29/20 1100     Hydrotherapy Pulsed lavage therapy - wound location: coccyx Pulsed Lavage with Suction (psi): 12 psi Pulsed Lavage with Suction - Normal Saline Used: 1000 mL Pulsed Lavage Tip: Tip with splash shield Selective Debridement Selective Debridement - Location: coccyx Selective Debridement - Tools Used: Forceps,Scalpel Selective Debridement - Tissue Removed: yellow/brown slough    Wound Assessment and Plan  Wound Therapy -  Assess/Plan/Recommendations Wound Therapy - Clinical Statement: Pt presents to hydrotherapy with pressure injury located on coccyx. Will benefit from selective debridement, pulsatile lavage and Santyl to cleanse wound and decrease amount of necrotic tissue. Wound Therapy - Functional Problem List: decreased mobility Factors Delaying/Impairing Wound Healing: Immobility,Multiple medical problems Hydrotherapy Plan: Debridement,Dressing change,Patient/family education,Pulsatile lavage with suction Wound Therapy - Frequency: 6X / week Wound Therapy - Follow Up Recommendations: dressing changes by RN   Wound Therapy Goals- Improve the function of patient's integumentary system by progressing the wound(s) through the phases of wound healing (inflammation - proliferation - remodeling) by: Wound Therapy Goals - Improve the function of patient's integumentary system by progressing the wound(s) through the phases of wound healing by: Decrease Necrotic Tissue to: 20 Decrease Necrotic Tissue - Progress: Goal set today Increase Granulation Tissue to: 80 Increase Granulation Tissue - Progress: Goal set today Goals/treatment plan/discharge plan were made with and agreed upon by patient/family: Yes Time For Goal Achievement: 7 days Wound Therapy - Potential for Goals: Good  Goals will be updated until maximal potential achieved or discharge criteria met.  Discharge criteria: when goals achieved, discharge from hospital, MD decision/surgical intervention, no progress towards goals, refusal/missing three consecutive treatments without notification or medical reason.  GP    Wyona Almas, PT, DPT Acute Rehabilitation Services Pager [phone] Office [phone]   Charges PT Wound Care Charges $Wound Debridement up to 20 cm: < or equal to 20 cm $PT PLS Gun and Tip: 1 Supply $PT Hydrotherapy Visit: 1 Visit       Deno Etienne 10/29/2020, 11:58 AM

## 2020-10-29 NOTE — Progress Notes (Signed)
Patient ID: Marcus Broughton., male   DOB: 1960/11/17, 60 y.o.   MRN: [id number]     Advanced Heart Failure Rounding Note  PCP-Cardiologist: No primary care provider on file.   Subjective:    - 2/27 Milrinone switched to dobutamine 5 mcg.  - 2/28 Moved to ICU. Dobutamine increased to 7.5 mcg and Norepi 3 mcg added. Diuretics held.  - 3/1 Impella 5.5 placed.  - 3/4 Teeth removed - 3/10 HM3 LVAD placed -3/13 VAD speed increased to 5500. [name] out  - 3/14 Nephrology consulted. Given 1UPRBCs  - 3/15 Started CRRT. Given 1UPRBCs.  - 3/23 VAD Speed turned down to 5300  - 3/23 CRRT and Milrinone Stopped. Back in Afib - 3/24 CRRT restarted.   INR 2.0, LDH 281  CVP 6-7 today with co-ox 60%.  Weight down 5 lbs.  Some dizziness with PIs this morning with standing. Has been up in chair.   Echo: EF 20-25%, normal RV, PASP 61, dilated IVC.   LVAD Interrogation HM 3: Speed: 5300 Flow: 4.7   PI: 2.9  Power: 3.8. PI events when standing.   Objective:   Weight Range: 79.4 kg Body mass index is 24.76 kg/m.   Vital Signs:   Temp:  [96.8 F (36 C)-98.2 F (36.8 C)] 96.8 F (36 C) (03/26 0700) Pulse Rate:  [38-120] 38 (03/26 0700) Resp:  [9-22] 16 (03/26 0700) BP: (84-185)/(66-159) 84/66 (03/25 1530) SpO2:  [94 %-100 %] 100 % (03/26 0700) Weight:  [79.4 kg] 79.4 kg (03/26 0700) Last BM Date: 10/28/20  Weight change: Filed Weights   10/27/20 0500 10/28/20 0500 10/29/20 0700  Weight: 83.1 kg 81.7 kg 79.4 kg    Intake/Output:   Intake/Output Summary (Last 24 hours) at 10/29/2020 0730 Last data filed at 10/29/2020 0700 Gross per 24 hour  Intake 1030.37 ml  Output 3499 ml  Net -2468.63 ml      Physical Exam  Maps 70s-80s CVP 6-7  General: Well appearing this am. NAD.  HEENT: Normal. Neck: Supple, JVP 7-8 cm. Carotids OK.  Cardiac:  Mechanical heart sounds with LVAD hum present.  Lungs:  CTAB, normal effort.  Abdomen:  NT, ND, no HSM. No bruits or masses. +BS  LVAD exit  site: Well-healed and incorporated. Dressing dry and intact. No erythema or drainage. Stabilization device present and accurately applied. Driveline dressing changed daily per sterile technique. Extremities:  Warm and dry. No cyanosis, clubbing, rash, or edema.  Neuro:  Alert & oriented x 3. Cranial nerves grossly intact. Moves all 4 extremities w/o difficulty. Affect pleasant   Skin: Sacral decubitus  Telemetry   NSR 80s (personally reviewed).     Labs    CBC Recent Labs    10/28/20 0512 10/29/20 0430  WBC 14.5* 12.3*  HGB 9.2* 9.3*  HCT 29.9* 30.9*  MCV 90.1 90.9  PLT 325 443   Basic Metabolic Panel Recent Labs    10/28/20 0512 10/28/20 0630 10/28/20 1558 10/29/20 0430  NA  --    < > 132* 132*  K  --    < > 4.6 4.1  CL  --    < > 95* 96*  CO2  --    < > 28 28  GLUCOSE  --    < > 166* 161*  BUN  --    < > 28* 26*  CREATININE  --    < > 2.60* 2.21*  CALCIUM  --    < > 8.6* 8.7*  MG 2.4  --   --  2.6*  PHOS  --    < > 2.5 2.1*   < > = values in this interval not displayed.   Liver Function Tests Recent Labs    10/28/20 1558 10/29/20 0430  ALBUMIN 2.5* 2.4*   No results for input(s): LIPASE, AMYLASE in the last 72 hours. Cardiac Enzymes No results for input(s): CKTOTAL, CKMB, CKMBINDEX, TROPONINI in the last 72 hours.  BNP: BNP (last 3 results) Recent Labs    10/14/20 0254 10/20/20 0305 10/27/20 0020  BNP 1,097.8* 466.7* 774.9*    ProBNP (last 3 results) No results for input(s): PROBNP in the last 8760 hours.   D-Dimer No results for input(s): DDIMER in the last 72 hours. Hemoglobin A1C No results for input(s): HGBA1C in the last 72 hours. Fasting Lipid Panel No results for input(s): CHOL, HDL, LDLCALC, TRIG, CHOLHDL, LDLDIRECT in the last 72 hours. Thyroid Function Tests No results for input(s): TSH, T4TOTAL, T3FREE, THYROIDAB in the last 72 hours.  Invalid input(s): FREET3  Other results:   Imaging    No results  found.   Medications:     Scheduled Medications: . aspirin EC  81 mg Oral Daily  . B-complex with vitamin C  1 tablet Oral Daily  . Chlorhexidine Gluconate Cloth  6 each Topical Daily  . collagenase   Topical Daily  . feeding supplement  237 mL Oral TID BM  . gabapentin  100 mg Oral QHS  . insulin aspart  0-20 Units Subcutaneous Q4H  . insulin aspart  10 Units Subcutaneous TID WC  . insulin glargine  25 Units Subcutaneous BID  . lidocaine  1 patch Transdermal Q24H  . mouth rinse  15 mL Mouth Rinse BID  . metoCLOPramide (REGLAN) injection  5 mg Intravenous Q12H  . midodrine  15 mg Oral TID WC  . pantoprazole  40 mg Oral Daily  . polyethylene glycol  17 g Oral BID  . senna-docusate  2 tablet Oral BID  . sodium chloride flush  10-40 mL Intracatheter Q12H  . sorbitol  30 mL Oral Daily  . Warfarin - Pharmacist Dosing Inpatient   Does not apply q1600    Infusions: . amiodarone 30 mg/hr (10/29/20 0700)  . epinephrine 1 mcg/min (10/29/20 0700)  . lactated ringers    . lactated ringers Stopped (10/17/20 1655)  . prismasol BGK 2/2.5 dialysis solution 1,500 mL/hr at 10/29/20 0405  . prismasol BGK 2/2.5 replacement solution 500 mL/hr at 10/29/20 0400  . prismasol BGK 2/2.5 replacement solution 200 mL/hr at 10/28/20 1525    PRN Medications: dextrose, dextrose, heparin, heparin, morphine injection, ondansetron (ZOFRAN) IV, oxyCODONE, sodium chloride flush, traMADol, white petrolatum    Assessment/Plan   1.Acute on chronic systolic HF -> cardiogenic shock:ICM.HasMedtronic ICD. EF 15%. Low output persisted despite milrinone and then dobutamine with rise in creatinine to 2.5. Impella 5.5 placed 3/1.  Creatinine improved to 1.7 and HM3 LVAD was placed on 3/10. Developed post-op renal failure requiring CVVH. Now off milrinone. VAD speed decreased to 5300. CRRT stopped 3/23 but restarted 3/24. Pulling net UF 100 cc/hr yesterday, now CVP 6-7 with co-ox 60%.  Orthostatic symptoms with  standing.  - Run CVVH even, would aim for CVP around 10.  Will need transition soon to [facility], timing per nephrology.  - Stop epinephrine.  - Continue midodrine 15 mg tid.  2. LVAD: 3/13 VAD speed increased to 5500. 3/23 speed decreased to 5300. VAD interrogated personally. Parameters stable.  LDH stable at 281.  - INR 2.0  today on warfarin, on ASA 81 daily. - DL site ok 3. CAD s/p CABG2002:Last cath in 6/19 with patent grafts.  - Continue Crestor 40 mg daily.  4. AKI on CKD Stage 3b: Suspect that this is a combination of cardiorenal syndrome and diabetic nephropathy and possibly ATN. Developed post-op AKI. Started on CRRT 3/15. CVVH stopped 3/23 but restarted 3/24. No urine output. CVP down to 6-7 with orthostasis.  - As above, run CVVH even aiming for CVP around 10.  - Transition to iHD, timing per nephrology.  5. Atrial flutter/fibrillation: s/p DC-CV 08/21/18. NSR today.    - Transition to po amiodarone.  6. Wound R Foot: Partial thickness skin loss noted. Cover with mepilex border and change every 5 days.  7. Right arm pain: Think neuropathic, per Dr. Cyndia Bent patient had stretch of brachial plexus with Impella placement.  8. ID:  WBC 12.3K. Afebrile. Blood Cx- NGTD.  9. XT:GGYIRSW of very poor control but hgbA1ctrending down. Most recent 7.7%.  Insulin drip stopped 3/24.  Now on lantus + meal coverage.  - Diabetes coordinators.  10. Anemia: Stable hgb.   11. Deconditioning - PT/OT following  - will need CIR once medically stable  12. Stage 2 sacral decubitus ulcer: Wound care. Continue reposition every 2 hours.    CRITICAL CARE Performed by: Loralie Champagne  Total critical care time: 35 minutes  Critical care time was exclusive of separately billable procedures and treating other patients.  Critical care was necessary to treat or prevent imminent or life-threatening deterioration.  Critical care was time spent personally by me on the following activities: development of  treatment plan with patient and/or surrogate as well as nursing, discussions with consultants, evaluation of patient's response to treatment, examination of patient, obtaining history from patient or surrogate, ordering and performing treatments and interventions, ordering and review of laboratory studies, ordering and review of radiographic studies, pulse oximetry and re-evaluation of patient's condition.  Loralie Champagne 10/29/2020 7:30 AM

## 2020-10-29 NOTE — Progress Notes (Signed)
[facility] NEPHROLOGY PROGRESS NOTE  Assessment/ Plan:  Acute kidney injury on chronic kidney disease stage IIIa: With underlying chronic kidney disease likely from diabetes/hypertension and worsening renal function from hemodynamically mediated ATN and cardiorenal syndrome.   -CRRT restarted 3/24. Will continue with CRRT (net even) with the plans on stopping tomorrow and then trial IHD on Monday -stop nsaids (still monitoring/hoping for renal recoery), would prefer fentanyl for severe pain (avoid morphine). Discussed with pharmd  Hyperkalemia -secondary to AKI, restarted cvvhdf as above, resolved  Acute exacerbation of chronic systolic heart failure with cardiogenic shock: status post LVAD. Remains on pressors with epinephrine.  Volume status/UF managed with CRRT.  Anemia:Likely secondary to chronic disease and exacerbated by recent surgical losses.  Transfuse PRBC as needed. hgb stable  Atrial fibrillation: per cardiology  Hypophosphatemia: replete prn  Leukocytosis: stable, w/u per primary  Protein calorie malnutrition, hypoalbuminemia: push protein, per primary  Pressure ulcer -per primary, see above for pain control  Gean Quint, MD [facility]  Subjective: Seen and examined in ICU earlier in AM.  Tolerating crrt, epi currently off. cvp's are down. More on the hypovolemic side. Biggest issue is pain from his pressure ulcer Objective Vital signs in last 24 hours: Vitals:   10/29/20 0800 10/29/20 0900 10/29/20 1000 10/29/20 1030  BP:      Pulse: 63 72  75  Resp: 15 11 17 12   Temp:      TempSrc:      SpO2: 100% 100%  100%  Weight:      Height:       Weight change: -2.3 kg  Intake/Output Summary (Last 24 hours) at 10/29/2020 1159 Last data filed at 10/29/2020 1000 Gross per 24 hour  Intake 1327.66 ml  Output 2237 ml  Net -909.34 ml       Labs: Basic Metabolic Panel: Recent Labs  Lab 10/28/20 0630 10/28/20 1558  10/29/20 0430  NA 132* 132* 132*  K 4.8 4.6 4.1  CL 95* 95* 96*  CO2 26 28 28   GLUCOSE 144* 166* 161*  BUN 33* 28* 26*  CREATININE 2.54* 2.60* 2.21*  CALCIUM 8.7* 8.6* 8.7*  PHOS 2.6 2.5 2.1*   Liver Function Tests: Recent Labs  Lab 10/28/20 0630 10/28/20 1558 10/29/20 0430  ALBUMIN 2.4* 2.5* 2.4*   No results for input(s): LIPASE, AMYLASE in the last 168 hours. No results for input(s): AMMONIA in the last 168 hours. CBC: Recent Labs  Lab 10/26/20 0418 10/26/20 1000 10/27/20 0424 10/27/20 2159 10/28/20 0512 10/29/20 0430  WBC 16.8* 17.3* 17.1*  --  14.5* 12.3*  HGB 8.1* 8.0* 7.8* 9.5* 9.2* 9.3*  HCT 26.7* 25.9* 25.8* 30.6* 29.9* 30.9*  MCV 92.1 90.6 91.5  --  90.1 90.9  PLT 396 370 348  --  325 329   Cardiac Enzymes: No results for input(s): CKTOTAL, CKMB, CKMBINDEX, TROPONINI in the last 168 hours. CBG: Recent Labs  Lab 10/28/20 1112 10/28/20 1523 10/28/20 2145 10/29/20 0419 10/29/20 0718  GLUCAP 196* 117* 158* 167* 76    Iron Studies: No results for input(s): IRON, TIBC, TRANSFERRIN, FERRITIN in the last 72 hours. Studies/Results: No results found.  Medications: Infusions: . lactated ringers    . lactated ringers Stopped (10/17/20 1655)  . prismasol BGK 2/2.5 dialysis solution 1,500 mL/hr at 10/29/20 0925  . prismasol BGK 2/2.5 replacement solution 500 mL/hr at 10/29/20 0400  . prismasol BGK 2/2.5 replacement solution 200 mL/hr at 10/28/20 1525    Scheduled Medications: . amiodarone  200 mg Oral BID  . aspirin EC  81 mg Oral Daily  . B-complex with vitamin C  1 tablet Oral Daily  . Chlorhexidine Gluconate Cloth  6 each Topical Daily  . collagenase   Topical Daily  . feeding supplement  237 mL Oral TID BM  . gabapentin  100 mg Oral QHS  . insulin aspart  0-20 Units Subcutaneous Q4H  . insulin aspart  10 Units Subcutaneous TID WC  . insulin glargine  25 Units Subcutaneous BID  . lidocaine  1 patch Transdermal Q24H  . mouth rinse  15 mL Mouth  Rinse BID  . metoCLOPramide (REGLAN) injection  5 mg Intravenous Q12H  . midodrine  15 mg Oral TID WC  . pantoprazole  40 mg Oral Daily  . polyethylene glycol  17 g Oral BID  . senna-docusate  2 tablet Oral BID  . sodium chloride flush  10-40 mL Intracatheter Q12H  . sorbitol  30 mL Oral Daily  . warfarin  3 mg Oral ONCE-1600  . Warfarin - Pharmacist Dosing Inpatient   Does not apply q1600    have reviewed scheduled and prn medications.  Physical Exam: General: NAD, sitting up in bed Heart: Mechanical heart sound/lvad hum Lungs: cta bl Abdomen:soft, Non-tender, non-distended Extremities: very trace pitting edema bl Dialysis Access: Temporary RIJ HD catheter.  Shone Leventhal 10/29/2020,11:59 AM  LOS: 31 days

## 2020-10-30 DIAGNOSIS — Z95811 Presence of heart assist device: Secondary | ICD-10-CM | POA: Diagnosis not present

## 2020-10-30 DIAGNOSIS — I5023 Acute on chronic systolic (congestive) heart failure: Secondary | ICD-10-CM | POA: Diagnosis not present

## 2020-10-30 LAB — RENAL FUNCTION PANEL
Albumin: 2.3 g/dL — ABNORMAL LOW (ref 3.5–5.0)
Albumin: 2.4 g/dL — ABNORMAL LOW (ref 3.5–5.0)
Anion gap: 7 (ref 5–15)
Anion gap: 6 (ref 5–15)
BUN: 24 mg/dL — ABNORMAL HIGH (ref 6–20)
BUN: 21 mg/dL — ABNORMAL HIGH (ref 6–20)
CO2: 28 mmol/L (ref 22–32)
CO2: 27 mmol/L (ref 22–32)
Calcium: 8.5 mg/dL — ABNORMAL LOW (ref 8.9–10.3)
Calcium: 8.3 mg/dL — ABNORMAL LOW (ref 8.9–10.3)
Chloride: 97 mmol/L — ABNORMAL LOW (ref 98–111)
Chloride: 98 mmol/L (ref 98–111)
Creatinine, Ser: 1.78 mg/dL — ABNORMAL HIGH (ref 0.61–1.24)
Creatinine, Ser: 1.64 mg/dL — ABNORMAL HIGH (ref 0.61–1.24)
GFR, Estimated: 43 mL/min — ABNORMAL LOW (ref >60)
GFR, Estimated: 48 mL/min — ABNORMAL LOW (ref >60)
Glucose, Bld: 91 mg/dL (ref 70–99)
Glucose, Bld: 104 mg/dL — ABNORMAL HIGH (ref 70–99)
Phosphorus: 2.1 mg/dL — ABNORMAL LOW (ref 2.5–4.6)
Phosphorus: 1.8 mg/dL — ABNORMAL LOW (ref 2.5–4.6)
Potassium: 3.9 mmol/L (ref 3.5–5.1)
Potassium: 3.8 mmol/L (ref 3.5–5.1)
Sodium: 132 mmol/L — ABNORMAL LOW (ref 135–145)
Sodium: 131 mmol/L — ABNORMAL LOW (ref 135–145)

## 2020-10-30 LAB — GLUCOSE, CAPILLARY
Glucose-Capillary: 161 mg/dL — ABNORMAL HIGH (ref 70–99)
Glucose-Capillary: 88 mg/dL (ref 70–99)
Glucose-Capillary: 64 mg/dL — ABNORMAL LOW (ref 70–99)
Glucose-Capillary: 148 mg/dL — ABNORMAL HIGH (ref 70–99)
Glucose-Capillary: 104 mg/dL — ABNORMAL HIGH (ref 70–99)
Glucose-Capillary: 92 mg/dL (ref 70–99)

## 2020-10-30 LAB — CBC
HCT: 29.6 % — ABNORMAL LOW (ref 39.0–52.0)
Hemoglobin: 8.9 g/dL — ABNORMAL LOW (ref 13.0–17.0)
MCH: 27.6 pg (ref 26.0–34.0)
MCHC: 30.1 g/dL (ref 30.0–36.0)
MCV: 91.9 fL (ref 80.0–100.0)
Platelets: 278 10*3/uL (ref 150–400)
RBC: 3.22 MIL/uL — ABNORMAL LOW (ref 4.22–5.81)
RDW: 17.4 % — ABNORMAL HIGH (ref 11.5–15.5)
WBC: 9.8 10*3/uL (ref 4.0–10.5)
nRBC: 0 % (ref 0.0–0.2)

## 2020-10-30 LAB — COOXEMETRY PANEL
Carboxyhemoglobin: 1.6 % — ABNORMAL HIGH (ref 0.5–1.5)
Methemoglobin: 1 % (ref 0.0–1.5)
O2 Saturation: 52.8 %
Total hemoglobin: 9.3 g/dL — ABNORMAL LOW (ref 12.0–16.0)

## 2020-10-30 LAB — PROTIME-INR
INR: 1.8 — ABNORMAL HIGH (ref 0.8–1.2)
Prothrombin Time: 20.3 seconds — ABNORMAL HIGH (ref 11.4–15.2)

## 2020-10-30 LAB — MAGNESIUM: Magnesium: 2.7 mg/dL — ABNORMAL HIGH (ref 1.7–2.4)

## 2020-10-30 LAB — LACTATE DEHYDROGENASE: LDH: 259 U/L — ABNORMAL HIGH (ref 98–192)

## 2020-10-30 MED ORDER — WARFARIN SODIUM 2 MG PO TABS
4.0000 mg | ORAL_TABLET | Freq: Once | ORAL | Status: AC
  Administered 2020-10-30: 4 mg via ORAL
  Filled 2020-10-30: qty 2

## 2020-10-30 MED ORDER — SILDENAFIL CITRATE 20 MG PO TABS
20.0000 mg | ORAL_TABLET | Freq: Three times a day (TID) | ORAL | Status: DC
  Administered 2020-10-30 (×3): 20 mg via ORAL
  Filled 2020-10-30 (×3): qty 1

## 2020-10-30 NOTE — Progress Notes (Signed)
ANTICOAGULATION CONSULT NOTE - Follow Up Consult  Pharmacy Consult for Warfarin Indication: LVAD  Allergies  Allergen Reactions  . Meclizine Hcl Anaphylaxis and Swelling  . Ivabradine Nausea Only    Patient Measurements: Height: 5' 10.5" (179.1 cm) Weight: 81.1 kg (178 lb 12.7 oz) IBW/kg (Calculated) : 74.15 Heparin Dosing Weight: 89.2 kg  Vital Signs: Temp: 97.8 F (36.6 C) (03/27 0700) Pulse Rate: 42 (03/27 0800)  Labs: Recent Labs    10/28/20 0512 10/28/20 0630 10/29/20 0430 10/29/20 1600 10/30/20 0404  HGB 9.2*  --  9.3*  --  8.9*  HCT 29.9*  --  30.9*  --  29.6*  PLT 325  --  329  --  278  LABPROT 23.0*  --  21.7*  --  20.3*  INR 2.1*  --  2.0*  --  1.8*  CREATININE  --    < > 2.21* 2.15* 1.78*   < > = values in this interval not displayed.    Estimated Creatinine Clearance: 46.9 mL/min (A) (by C-G formula based on SCr of 1.78 mg/dL (H)).   Medical History: Past Medical History:  Diagnosis Date  . AICD (automatic cardioverter/defibrillator) present    Single-chamber  implantable cardiac defibrillator - Medtronic  . Atrial fibrillation ([place])   . Cataract    Mixed form OU  . CHF (congestive heart failure) ([place])   . Chronic kidney disease   . Chronic kidney disease (CKD), stage III (moderate) (HCC)   . Chronic systolic heart failure ([place])    a. Echo 5/13: Mild LVE, mild LVH, EF 10%, anteroseptal, lateral, apical AK, mild MR, mild LAE, moderately reduced RVSF, mild RAE, PASP 60;  b. 07/2014 Echo: EF 20-2%, diff HK, AKI of antsep/apical/mid-apicalinferior, mod reduced RV.  Marland Kitchen Coronary artery disease    a. s/p CABG 2002 b. LHC 5/13:  dLM 80%, LAD subtotally occluded, pCFX occluded, pRCA 50%, mid? Occlusion with high take off of the PDA with 70% multiple lesions-not bypassed and supplies collaterals to LAD, LIMA-IM/ramus ok, S-OM ok, S-PLA branches ok. Medical therapy was recommended  . COVID   . Diabetic retinopathy (HCC)    NPDR OD, PDR OS  . Dyspnea   .  Gout    "on daily RX" (01/08/2018)  . Hypertension   . Hypertensive retinopathy    OU  . Ischemic cardiomyopathy    a. Echo 5/13: Mild LVE, mild LVH, EF 10%, anteroseptal, lateral, apical AK, mild MR, mild LAE, moderately reduced RVSF, mild RAE, PASP 60;  b. 01/2012 s/p MDT [id number] Protecta XT VR AICD;  c. 07/2014 Echo: EF 20-2%, diff HK, AKI of antsep/apical/mid-apicalinferior, mod reduced RV.  Marland Kitchen MRSA (methicillin resistant Staphylococcus aureus)    Status post right foot plantar deep infection with MRSA status post  I&D 10/2008  . Myocardial infarction [facility])    "was told I'd had several before heart OR in 2002" (01/08/2018)  . Noncompliance   . Peripheral neuropathy   . Retinopathy, diabetic, background ([place])   . Syncope   . Type II diabetes mellitus ([place])    requiring insulin   . Vitreous hemorrhage, left (HCC)    and proliferative diabetic retinopathy    Assessment: 60 yo M s/p new LVAD implant with HeartMate III on 3/10. Warfarin started per MD on 3/12, and pharmacy asked to take over warfarin dosing.   INR slightly subtherapeutic at 1.8. Currently taking 2-3 Ensures daily, eating more. Hgb 8.9. No overt bleeding. LDH stable 259, Hgb stable.  Goal of Therapy:  INR 2-2.5 Monitor platelets by anticoagulation protocol: Yes   Plan:  Warfarin 4 mg PO tonight  Daily PT/INR, CBC, monitor s/sx bleeding  [place] Crane, Vena [place], BCPS, BCCP Clinical Pharmacist  10/30/2020 10:02 AM   [facility] pharmacy phone numbers are listed on [email or web address]

## 2020-10-30 NOTE — Progress Notes (Signed)
LVAD Coordinator Rounding Note:  Admitted 09/28/20 due to worsening heart failure symptoms. Recent COVID infection January 2022; did not take medications for 3-4 weeks.   S/p dental extractions 10/07/20 per Dr Benson Norway.  HM III LVAD implanted on 10/13/20 by Dr Cyndia Bent under Destination Therapy criteria.  Pt lying in bed, Dr. Candiss Norse in room. Discussed plans with patient to continue pulling 50 dd/hr for another 6 hrs today then trying intermittent HD tomorrow with goal of 2L as tolerated based on his blood pressure.   Pt says he feels ok today, but is asking to be turned from right side to left side. BS nurse will turn patient. He reports he had therapy on his sacral wound yesterday that was very painful; encouragement offered. Patient plans on getting OOB later today.   Vital signs: Temp: 97.8   HR: 74 Doppler:  70 Auto cuff:  Not documented O2 Sat: 100% on RA Wt: 200.1>221.7>219.5..........182.7>174.8>177.2>183.2>180>175>178 lbs    LVAD interrogation reveals:  Speed: 5300 Flow: 4.9 Power: 3.8w PI: 2.3 Hematocrit: 31  Alarms: none Events: >80 PI events today  Fixed speed: 5300 Low speed limit: 5000  Drive Line: Existing VAD dressing removed and site care performed using sterile technique. Drive line exit site cleaned with Chlora prep applicators x 2, allowed to dry, and gauze dressing with silver strip re-applied; skin tears healing. NO skin prep used due to skin intolerance of same. Exit site healing and unincorporated the velour is fully implanted at exit site. There is 1 suture visible that is wrapped around the driveline. Small amount of brown drainage from fat necrosis. No redness, tenderness, foul odor or rash noted. Drive line anchor correctly applied. Continue daily dressing changes by VAD coordinator or nurse champion. Next dressing change due 10/31/20.  Labs:  LDH trend: 444>670>575>571.....370>361>327>310>306>281>259  INR trend:  1.4>1.7>1.8>1.7>1.6>1.8............2.0>2.2>2.1>1.8  Creatinine: 1.84>3.0>3.4>2.73.....1.76>1.68>2.55>3.53>2.54>2.6>2.2>1.78  Anticoagulation Plan: -INR Goal: 2.0-2.5 -ASA Dose: 81 mg  Blood Products:  IntraOp:   1 Platelet  6 FFP  3 PRBC  1035 cell saver Postop:   10/13/20:   4 FFP  4 PRBC    10/14/20:  1 FFP  1 PRBC   10/16/20:  1 PRBC   10/18/20:  1 PRBC   10/16/20:  1 PRBC   10/18/20:  1 PRBC   10/27/20:  1 PRBC  Device: -  Medtronic single - Therapies: off   Arrythmias: VT in OR requiring defibrillation   Renal: CRRT   - started 10/19/20   - stopped 10/26/20   - re-started 10/27/20  Infection: - 10/27/20 blood cultures - pending  Patient Education: 1. Patient states he is tired and uncomfortable; teaching not appropriate at this time.   Plan/Recommendations:  1. Call VAD coordinator for equipment or drive line issues 2. Daily drive line dressing change per VAD coordinator or nurse champion. Next dressing change due 10/31/20.  Zada Girt RN [place] Coordinator  Office: [phone]  24/7 Pager: [phone]

## 2020-10-30 NOTE — Progress Notes (Signed)
[facility] NEPHROLOGY PROGRESS NOTE  Assessment/ Plan:  Acute kidney injury on chronic kidney disease stage III: With underlying chronic kidney disease likely from diabetes/hypertension and worsening renal function from hemodynamically mediated ATN and cardiorenal syndrome. CRRT 3/15-3/22 -Unfortunately, no signs of renal recovery at the moment  -CRRT restarted 3/24. Continue with CRRT with the plans of making him net neg 50cc/hr for about 6 hours or so (also started on sildenafil today so we'll see how his BP tolerates) and then will be rinsed back this afternoon. Will aim for IHD trial tomorrow, UF goal will be 2L as tolerated. -strict I/O  Hyperkalemia -secondary to AKI, restarted cvvhdf as above, resolved  Hyponatremia -CVP's are up, likely hypervolemic. Pain is also definitely a contributing factor. Pain ctrl per primary service. Na being managed with renal replacement therapy  Acute exacerbation of chronic systolic heart failure with cardiogenic shock: LVAD, placed 3/10. off epinephrine now.  Volume status/UF managed with CRRT.  Anemia:Likely secondary to chronic disease and exacerbated by recent surgical losses.  Transfuse PRBC as needed. hgb stable  Atrial fibrillation: per cardiology  Hypophosphatemia: replete prn  Leukocytosis: improved, w/u per primary  Protein calorie malnutrition, hypoalbuminemia: push protein, per primary  Pressure ulcer -per primary, see above for pain control  Gean Quint, MD [facility]  Subjective: Seen and examined in ICU earlier in AM.  Tolerating crrt, epi currently off. cvp's are up to 12. Plan to make him net neg 50cc/hr and eventually will rinse back later this afternoon. Other than "butt pain" (pain at the site of his ulcer), patient denies chest pain, SOB. He reports that his appetite is very good. He does not reports any urine output. Objective Vital signs in last 24 hours: Vitals:   10/30/20 0500  10/30/20 0600 10/30/20 0700 10/30/20 0800  BP:      Pulse: 62 64 61 (!) 42  Resp: 16 (!) 8 19 (!) 22  Temp:   97.8 F (36.6 C)   TempSrc:      SpO2: 100% 100% 100% 94%  Weight:   81.1 kg   Height:       Weight change: 1.7 kg  Intake/Output Summary (Last 24 hours) at 10/30/2020 0914 Last data filed at 10/30/2020 0800 Gross per 24 hour  Intake 900 ml  Output 918 ml  Net -18 ml       Labs: Basic Metabolic Panel: Recent Labs  Lab 10/29/20 0430 10/29/20 1600 10/30/20 0404  NA 132* 134* 132*  K 4.1 4.2 3.9  CL 96* 98 97*  CO2 28 28 28   GLUCOSE 161* 86 91  BUN 26* 24* 24*  CREATININE 2.21* 2.15* 1.78*  CALCIUM 8.7* 8.7* 8.5*  PHOS 2.1* 2.2* 2.1*   Liver Function Tests: Recent Labs  Lab 10/29/20 0430 10/29/20 1600 10/30/20 0404  ALBUMIN 2.4* 2.4* 2.3*   No results for input(s): LIPASE, AMYLASE in the last 168 hours. No results for input(s): AMMONIA in the last 168 hours. CBC: Recent Labs  Lab 10/26/20 1000 10/27/20 0424 10/27/20 2159 10/28/20 0512 10/29/20 0430 10/30/20 0404  WBC 17.3* 17.1*  --  14.5* 12.3* 9.8  HGB 8.0* 7.8*   < > 9.2* 9.3* 8.9*  HCT 25.9* 25.8*   < > 29.9* 30.9* 29.6*  MCV 90.6 91.5  --  90.1 90.9 91.9  PLT 370 348  --  325 329 278   < > = values in this interval not displayed.   Cardiac Enzymes: No results for input(s): CKTOTAL,  CKMB, CKMBINDEX, TROPONINI in the last 168 hours. CBG: Recent Labs  Lab 10/29/20 1135 10/29/20 1605 10/29/20 1952 10/30/20 0400 10/30/20 0647  GLUCAP 161* 85 93 88 64*    Iron Studies: No results for input(s): IRON, TIBC, TRANSFERRIN, FERRITIN in the last 72 hours. Studies/Results: No results found.  Medications: Infusions: . lactated ringers    . lactated ringers Stopped (10/17/20 1655)  . prismasol BGK 2/2.5 dialysis solution 1,500 mL/hr at 10/30/20 0600  . prismasol BGK 2/2.5 replacement solution 500 mL/hr at 10/30/20 0601  . prismasol BGK 2/2.5 replacement solution 200 mL/hr at 10/28/20  1525    Scheduled Medications: . amiodarone  200 mg Oral BID  . aspirin EC  81 mg Oral Daily  . B-complex with vitamin C  1 tablet Oral Daily  . Chlorhexidine Gluconate Cloth  6 each Topical Daily  . collagenase   Topical Daily  . feeding supplement  237 mL Oral TID BM  . gabapentin  100 mg Oral QHS  . insulin aspart  0-20 Units Subcutaneous Q4H  . insulin aspart  10 Units Subcutaneous TID WC  . insulin glargine  25 Units Subcutaneous BID  . lidocaine  1 patch Transdermal Q24H  . mouth rinse  15 mL Mouth Rinse BID  . metoCLOPramide (REGLAN) injection  5 mg Intravenous Q12H  . midodrine  15 mg Oral TID WC  . pantoprazole  40 mg Oral Daily  . polyethylene glycol  17 g Oral BID  . senna-docusate  2 tablet Oral BID  . sildenafil  20 mg Oral TID  . sodium chloride flush  10-40 mL Intracatheter Q12H  . Warfarin - Pharmacist Dosing Inpatient   Does not apply q1600    have reviewed scheduled and prn medications.  Physical Exam: General: NAD, sitting up in bed Heart: Mechanical heart sound/lvad hum Lungs: cta bl Abdomen:soft, Non-tender, non-distended Extremities: very trace pitting edema bl Dialysis Access: Temporary RIJ HD catheter.  Dearius Hoffmann 10/30/2020,9:14 AM  LOS: 32 days

## 2020-10-30 NOTE — Progress Notes (Signed)
Patient ID: Saleh Bloodsaw., male   DOB: Jul 09, 1961, 60 y.o.   MRN: [id number]     Advanced Heart Failure Rounding Note  PCP-Cardiologist: No primary care provider on file.   Subjective:    - 2/27 Milrinone switched to dobutamine 5 mcg.  - 2/28 Moved to ICU. Dobutamine increased to 7.5 mcg and Norepi 3 mcg added. Diuretics held.  - 3/1 Impella 5.5 placed.  - 3/4 Teeth removed - 3/10 HM3 LVAD placed -3/13 VAD speed increased to 5500. Ernestine Conrad out  - 3/14 Nephrology consulted. Given 1UPRBCs  - 3/15 Started CRRT. Given 1UPRBCs.  - 3/23 VAD Speed turned down to 5300  - 3/23 CRRT and Milrinone Stopped. Back in Afib - 3/24 CRRT restarted.   INR 1.8, LDH 281 => 259  CVVH with UF running even for the last day.   CVP 12 today with early am co-ox 53%.  He had a lot of PI events yesterday morning, only 3 overnight.   Hydrotherapy to decubitus ulcer yesterday.   Echo: EF 20-25%, normal RV, PASP 61, dilated IVC.   LVAD Interrogation HM 3: Speed: 5300 Flow: 4.3  PI: 3.3  Power: 3.7.  Multiple PI events yesterday morning, only 3 overnight.   Objective:   Weight Range: 79.4 kg Body mass index is 24.76 kg/m.   Vital Signs:   Temp:  [96.8 F (36 C)-97.8 F (36.6 C)] 97.8 F (36.6 C) (03/26 1949) Pulse Rate:  [38-88] 64 (03/27 0600) Resp:  [8-22] 8 (03/27 0600) SpO2:  [94 %-100 %] 100 % (03/27 0600) Weight:  [79.4 kg] 79.4 kg (03/26 0700) Last BM Date: 10/28/20  Weight change: Filed Weights   10/27/20 0500 10/28/20 0500 10/29/20 0700  Weight: 83.1 kg 81.7 kg 79.4 kg    Intake/Output:   Intake/Output Summary (Last 24 hours) at 10/30/2020 0655 Last data filed at 10/30/2020 0600 Gross per 24 hour  Intake 1170.34 ml  Output 965 ml  Net 205.34 ml      Physical Exam  Maps 80s CVP 12 General: NAD Neck: JVP 10 cm, no thyromegaly or thyroid nodule.  Lungs: Clear to auscultation bilaterally with normal respiratory effort. CV: Nondisplaced PMI.  Heart regular S1/S2, no  S3/S4, no murmur.  No peripheral edema.  No carotid bruit.  Normal pedal pulses.  Abdomen: Soft, nontender, no hepatosplenomegaly, no distention.  Skin: Intact without lesions or rashes.  Neurologic: Alert and oriented x 3.  Psych: Normal affect. Extremities: No clubbing or cyanosis.  HEENT: Normal.  Skin: Sacral decubitus  Telemetry   NSR 80s (personally reviewed).     Labs    CBC Recent Labs    10/29/20 0430 10/30/20 0404  WBC 12.3* 9.8  HGB 9.3* 8.9*  HCT 30.9* 29.6*  MCV 90.9 91.9  PLT 329 278   Basic Metabolic Panel Recent Labs    43/32/95 0430 10/29/20 1600 10/30/20 0404  NA 132* 134* 132*  K 4.1 4.2 3.9  CL 96* 98 97*  CO2 28 28 28   GLUCOSE 161* 86 91  BUN 26* 24* 24*  CREATININE 2.21* 2.15* 1.78*  CALCIUM 8.7* 8.7* 8.5*  MG 2.6*  --  2.7*  PHOS 2.1* 2.2* 2.1*   Liver Function Tests Recent Labs    10/29/20 1600 10/30/20 0404  ALBUMIN 2.4* 2.3*   No results for input(s): LIPASE, AMYLASE in the last 72 hours. Cardiac Enzymes No results for input(s): CKTOTAL, CKMB, CKMBINDEX, TROPONINI in the last 72 hours.  BNP: BNP (last 3 results) Recent  Labs    10/14/20 0254 10/20/20 0305 10/27/20 0020  BNP 1,097.8* 466.7* 774.9*    ProBNP (last 3 results) No results for input(s): PROBNP in the last 8760 hours.   D-Dimer No results for input(s): DDIMER in the last 72 hours. Hemoglobin A1C No results for input(s): HGBA1C in the last 72 hours. Fasting Lipid Panel No results for input(s): CHOL, HDL, LDLCALC, TRIG, CHOLHDL, LDLDIRECT in the last 72 hours. Thyroid Function Tests No results for input(s): TSH, T4TOTAL, T3FREE, THYROIDAB in the last 72 hours.  Invalid input(s): FREET3  Other results:   Imaging    No results found.   Medications:     Scheduled Medications: . amiodarone  200 mg Oral BID  . aspirin EC  81 mg Oral Daily  . B-complex with vitamin C  1 tablet Oral Daily  . Chlorhexidine Gluconate Cloth  6 each Topical Daily  .  collagenase   Topical Daily  . feeding supplement  237 mL Oral TID BM  . gabapentin  100 mg Oral QHS  . insulin aspart  0-20 Units Subcutaneous Q4H  . insulin aspart  10 Units Subcutaneous TID WC  . insulin glargine  25 Units Subcutaneous BID  . lidocaine  1 patch Transdermal Q24H  . mouth rinse  15 mL Mouth Rinse BID  . metoCLOPramide (REGLAN) injection  5 mg Intravenous Q12H  . midodrine  15 mg Oral TID WC  . pantoprazole  40 mg Oral Daily  . polyethylene glycol  17 g Oral BID  . senna-docusate  2 tablet Oral BID  . sildenafil  20 mg Oral TID  . sodium chloride flush  10-40 mL Intracatheter Q12H  . Warfarin - Pharmacist Dosing Inpatient   Does not apply q1600    Infusions: . lactated ringers    . lactated ringers Stopped (10/17/20 1655)  . prismasol BGK 2/2.5 dialysis solution 1,500 mL/hr at 10/30/20 0600  . prismasol BGK 2/2.5 replacement solution 500 mL/hr at 10/30/20 0601  . prismasol BGK 2/2.5 replacement solution 200 mL/hr at 10/28/20 1525    PRN Medications: acetaminophen, dextrose, dextrose, fentaNYL (SUBLIMAZE) injection, heparin, heparin, ondansetron (ZOFRAN) IV, oxyCODONE, sodium chloride flush, white petrolatum    Assessment/Plan   1.Acute on chronic systolic HF -> cardiogenic shock:ICM.HasMedtronic ICD. EF 15%. Low output persisted despite milrinone and then dobutamine with rise in creatinine to 2.5. Impella 5.5 placed 3/1.  Creatinine improved to 1.7 and HM3 LVAD was placed on 3/10. Developed post-op renal failure requiring CVVH. Now off milrinone and epinephrine. Last echo showed moderate RV enlargement with mildly decreased RV function and VAD speed decreased to 5300 due to left-sided septum. CRRT stopped 3/23 but restarted 3/24. UF even over the last day, now CVP 12 with co-ox 60%.  Not as lightheaded with standing.  MAP 80s.  - Run CVVH UF net negative 50 cc/hr for now, plan to stop later today and start iHD tomorrow.   - Continue midodrine 15 mg tid.  -  With improved MAP, can try him on sildenafil 20 tid for RV failure.  2. LVAD: 3/13 VAD speed increased to 5500. 3/23 speed decreased to 5300. VAD interrogated personally. Parameters stable.  LDH stable at 259.  - INR 1.8 today on warfarin, on ASA 81 daily. - DL site ok 3. CAD s/p CABG2002:Last cath in 6/19 with patent grafts.  - Continue Crestor 40 mg daily.  4. AKI on CKD Stage 3b: Suspect that this is a combination of cardiorenal syndrome and diabetic nephropathy and  possibly ATN. Developed post-op AKI. Started on CRRT 3/15. CVVH stopped 3/23 but restarted 3/24. No urine output. CVP 12 today. - As above, run CVVH net UF -50 cc/hr, stop later today and transition to [facility] tomorrow.  5. Atrial flutter/fibrillation: s/p DC-CV 08/21/18. NSR today.    - Continue po amiodarone.  6. Wound R Foot: Partial thickness skin loss noted. Cover with mepilex border and change every 5 days.  7. Right arm pain: Think neuropathic, per Dr. Laneta Simmers patient had stretch of brachial plexus with Impella placement.  8. ID:  WBCs normal. Afebrile. Blood Cx- NGTD.  9. NW:GNFAOZH of very poor control but hgbA1ctrending down. Most recent 7.7%.  Insulin drip stopped 3/24.  Now on lantus + meal coverage.  - Diabetes coordinators.  10. Anemia: Stable hgb.   11. Deconditioning - PT/OT following, needs aggressive work.   - will need CIR once medically stable  12. Stage 2 sacral decubitus ulcer: Wound care. Continue reposition every 2 hours.    CRITICAL CARE Performed by: Marca Ancona  Total critical care time: 35 minutes  Critical care time was exclusive of separately billable procedures and treating other patients.  Critical care was necessary to treat or prevent imminent or life-threatening deterioration.  Critical care was time spent personally by me on the following activities: development of treatment plan with patient and/or surrogate as well as nursing, discussions with consultants, evaluation of patient's  response to treatment, examination of patient, obtaining history from patient or surrogate, ordering and performing treatments and interventions, ordering and review of laboratory studies, ordering and review of radiographic studies, pulse oximetry and re-evaluation of patient's condition.  Marca Ancona 10/30/2020 6:55 AM

## 2020-10-31 DIAGNOSIS — I5023 Acute on chronic systolic (congestive) heart failure: Secondary | ICD-10-CM | POA: Diagnosis not present

## 2020-10-31 DIAGNOSIS — Z95811 Presence of heart assist device: Secondary | ICD-10-CM | POA: Diagnosis not present

## 2020-10-31 LAB — RENAL FUNCTION PANEL
Albumin: 2.3 g/dL — ABNORMAL LOW (ref 3.5–5.0)
Albumin: 2.4 g/dL — ABNORMAL LOW (ref 3.5–5.0)
Anion gap: 9 (ref 5–15)
Anion gap: 8 (ref 5–15)
BUN: 33 mg/dL — ABNORMAL HIGH (ref 6–20)
BUN: 22 mg/dL — ABNORMAL HIGH (ref 6–20)
CO2: 26 mmol/L (ref 22–32)
CO2: 28 mmol/L (ref 22–32)
Calcium: 8.7 mg/dL — ABNORMAL LOW (ref 8.9–10.3)
Calcium: 8.6 mg/dL — ABNORMAL LOW (ref 8.9–10.3)
Chloride: 97 mmol/L — ABNORMAL LOW (ref 98–111)
Chloride: 97 mmol/L — ABNORMAL LOW (ref 98–111)
Creatinine, Ser: 2.68 mg/dL — ABNORMAL HIGH (ref 0.61–1.24)
Creatinine, Ser: 2.33 mg/dL — ABNORMAL HIGH (ref 0.61–1.24)
GFR, Estimated: 27 mL/min — ABNORMAL LOW (ref >60)
GFR, Estimated: 31 mL/min — ABNORMAL LOW (ref >60)
Glucose, Bld: 68 mg/dL — ABNORMAL LOW (ref 70–99)
Glucose, Bld: 82 mg/dL (ref 70–99)
Phosphorus: 1.6 mg/dL — ABNORMAL LOW (ref 2.5–4.6)
Phosphorus: 1.4 mg/dL — ABNORMAL LOW (ref 2.5–4.6)
Potassium: 4.1 mmol/L (ref 3.5–5.1)
Potassium: 3.9 mmol/L (ref 3.5–5.1)
Sodium: 132 mmol/L — ABNORMAL LOW (ref 135–145)
Sodium: 133 mmol/L — ABNORMAL LOW (ref 135–145)

## 2020-10-31 LAB — COOXEMETRY PANEL
Carboxyhemoglobin: 2 % — ABNORMAL HIGH (ref 0.5–1.5)
Methemoglobin: 0.7 % (ref 0.0–1.5)
O2 Saturation: 59.9 %
Total hemoglobin: 8.8 g/dL — ABNORMAL LOW (ref 12.0–16.0)

## 2020-10-31 LAB — CBC
HCT: 27.2 % — ABNORMAL LOW (ref 39.0–52.0)
Hemoglobin: 8.3 g/dL — ABNORMAL LOW (ref 13.0–17.0)
MCH: 27.7 pg (ref 26.0–34.0)
MCHC: 30.5 g/dL (ref 30.0–36.0)
MCV: 90.7 fL (ref 80.0–100.0)
Platelets: 277 10*3/uL (ref 150–400)
RBC: 3 MIL/uL — ABNORMAL LOW (ref 4.22–5.81)
RDW: 17.1 % — ABNORMAL HIGH (ref 11.5–15.5)
WBC: 11.3 10*3/uL — ABNORMAL HIGH (ref 4.0–10.5)
nRBC: 0 % (ref 0.0–0.2)

## 2020-10-31 LAB — GLUCOSE, CAPILLARY
Glucose-Capillary: 104 mg/dL — ABNORMAL HIGH (ref 70–99)
Glucose-Capillary: 119 mg/dL — ABNORMAL HIGH (ref 70–99)
Glucose-Capillary: 133 mg/dL — ABNORMAL HIGH (ref 70–99)
Glucose-Capillary: 188 mg/dL — ABNORMAL HIGH (ref 70–99)
Glucose-Capillary: 58 mg/dL — ABNORMAL LOW (ref 70–99)
Glucose-Capillary: 87 mg/dL (ref 70–99)
Glucose-Capillary: 92 mg/dL (ref 70–99)

## 2020-10-31 LAB — LACTATE DEHYDROGENASE: LDH: 246 U/L — ABNORMAL HIGH (ref 98–192)

## 2020-10-31 LAB — PROTIME-INR
INR: 1.9 — ABNORMAL HIGH (ref 0.8–1.2)
Prothrombin Time: 20.7 seconds — ABNORMAL HIGH (ref 11.4–15.2)

## 2020-10-31 LAB — MAGNESIUM: Magnesium: 2.5 mg/dL — ABNORMAL HIGH (ref 1.7–2.4)

## 2020-10-31 MED ORDER — INSULIN ASPART 100 UNIT/ML ~~LOC~~ SOLN
0.0000 [IU] | Freq: Every day | SUBCUTANEOUS | Status: DC
  Administered 2020-11-02: 2 [IU] via SUBCUTANEOUS

## 2020-10-31 MED ORDER — WARFARIN SODIUM 2 MG PO TABS
4.0000 mg | ORAL_TABLET | Freq: Once | ORAL | Status: AC
  Administered 2020-10-31: 4 mg via ORAL
  Filled 2020-10-31: qty 2

## 2020-10-31 MED ORDER — RENA-VITE PO TABS
1.0000 | ORAL_TABLET | Freq: Every day | ORAL | Status: DC
  Administered 2020-10-31 – 2020-11-13 (×14): 1 via ORAL
  Filled 2020-10-31 (×14): qty 1

## 2020-10-31 MED ORDER — INSULIN ASPART 100 UNIT/ML ~~LOC~~ SOLN: 10.0000 [IU] | Freq: Three times a day (TID) | SUBCUTANEOUS | Status: DC

## 2020-10-31 MED ORDER — INSULIN ASPART 100 UNIT/ML ~~LOC~~ SOLN
0.0000 [IU] | Freq: Three times a day (TID) | SUBCUTANEOUS | Status: DC
  Administered 2020-11-01 – 2020-11-03 (×5): 4 [IU] via SUBCUTANEOUS
  Administered 2020-11-03 – 2020-11-05 (×2): 3 [IU] via SUBCUTANEOUS

## 2020-10-31 MED ORDER — K PHOS MONO-SOD PHOS DI & MONO 155-852-130 MG PO TABS
250.0000 mg | ORAL_TABLET | Freq: Four times a day (QID) | ORAL | Status: AC
  Administered 2020-10-31 (×3): 250 mg via ORAL
  Filled 2020-10-31 (×3): qty 1

## 2020-10-31 MED ORDER — INSULIN GLARGINE 100 UNIT/ML ~~LOC~~ SOLN
20.0000 [IU] | Freq: Two times a day (BID) | SUBCUTANEOUS | Status: DC
  Administered 2020-10-31 – 2020-11-06 (×11): 20 [IU] via SUBCUTANEOUS
  Filled 2020-10-31 (×17): qty 0.2

## 2020-10-31 MED ORDER — TRAZODONE HCL 50 MG PO TABS
50.0000 mg | ORAL_TABLET | Freq: Every evening | ORAL | Status: DC | PRN
  Administered 2020-10-31 – 2020-11-13 (×11): 50 mg via ORAL
  Filled 2020-10-31 (×11): qty 1

## 2020-10-31 MED ORDER — PROSOURCE PLUS PO LIQD
30.0000 mL | Freq: Two times a day (BID) | ORAL | Status: DC
  Administered 2020-11-01 – 2020-11-14 (×20): 30 mL via ORAL
  Filled 2020-10-31 (×19): qty 30

## 2020-10-31 NOTE — Progress Notes (Signed)
Patient ID: Marcus Oriley., male   DOB: [birth year], 60 y.o.   MRN: [id number]     Advanced Heart Failure Rounding Note  PCP-Cardiologist: No primary care provider on file.   Subjective:    - 2/27 Milrinone switched to dobutamine 5 mcg.  - 2/28 Moved to ICU. Dobutamine increased to 7.5 mcg and Norepi 3 mcg added. Diuretics held.  - 3/1 Impella 5.5 placed.  - 3/4 Teeth removed - 3/10 HM3 LVAD placed -3/13 VAD speed increased to 5500. [name] out  - 3/14 Nephrology consulted. Given 1UPRBCs  - 3/15 Started CRRT. Given 1UPRBCs.  - 3/23 VAD Speed turned down to 5300  - 3/23 CRRT and Milrinone Stopped. Back in Afib - 3/24 CRRT restarted.  - 3/27 CVVH off.   INR 1.9, LDH 281 => 259 => 246  CVVH stopped yesterday.  CVP 16-17 today.  Frequent PI events overnight, MAP in 70s.  Co-ox 60%.   Did not sleep well due to decubitus ulcer pain. Up to chair for 2 hours yesterday.   Echo: EF 20-25%, normal RV, PASP 61, dilated IVC.   LVAD Interrogation HM 3: Speed: 5300 Flow: 4.9  PI: 2.4  Power: 3.8.  Multiple PI events  Objective:   Weight Range: 84.3 kg Body mass index is 26.29 kg/m.   Vital Signs:   Temp:  [97.9 F (36.6 C)-99.3 F (37.4 C)] 98.9 F (37.2 C) (03/28 0420) Pulse Rate:  [29-151] 86 (03/28 0600) Resp:  [8-27] 16 (03/28 0600) BP: (79-93)/(37-73) 82/73 (03/28 0420) SpO2:  [24 %-100 %] 99 % (03/28 0600) Weight:  [84.3 kg] 84.3 kg (03/28 0500) Last BM Date: 10/28/20  Weight change: Filed Weights   10/29/20 0700 10/30/20 0700 10/31/20 0500  Weight: 79.4 kg 81.1 kg 84.3 kg    Intake/Output:   Intake/Output Summary (Last 24 hours) at 10/31/2020 0737 Last data filed at 10/31/2020 0400 Gross per 24 hour  Intake 1077 ml  Output 541 ml  Net 536 ml      Physical Exam  Maps 70s CVP 16-17 General: Well appearing this am. NAD.  HEENT: Normal. Neck: Supple, JVP 12+ cm. Carotids OK.  Cardiac:  Mechanical heart sounds with LVAD hum present.  Lungs:  CTAB,  normal effort.  Abdomen:  NT, ND, no HSM. No bruits or masses. +BS  LVAD exit site: Well-healed and incorporated. Dressing dry and intact. No erythema or drainage. Stabilization device present and accurately applied. Driveline dressing changed daily per sterile technique. Extremities:  Warm and dry. No cyanosis, clubbing, rash, or edema.  Neuro:  Alert & oriented x 3. Cranial nerves grossly intact. Moves all 4 extremities w/o difficulty. Affect pleasant   Skin: Sacral decubitus  Telemetry   NSR 80s (personally reviewed).     Labs    CBC Recent Labs    10/30/20 0404 10/31/20 0420  WBC 9.8 11.3*  HGB 8.9* 8.3*  HCT 29.6* 27.2*  MCV 91.9 90.7  PLT 278 093   Basic Metabolic Panel Recent Labs    10/30/20 0404 10/30/20 1622 10/31/20 0420  NA 132* 131* 132*  K 3.9 3.8 4.1  CL 97* 98 97*  CO2 28 27 26   GLUCOSE 91 104* 68*  BUN 24* 21* 33*  CREATININE 1.78* 1.64* 2.68*  CALCIUM 8.5* 8.3* 8.7*  MG 2.7*  --  2.5*  PHOS 2.1* 1.8* 1.6*   Liver Function Tests Recent Labs    10/30/20 1622 10/31/20 0420  ALBUMIN 2.4* 2.3*   No results for input(s):  LIPASE, AMYLASE in the last 72 hours. Cardiac Enzymes No results for input(s): CKTOTAL, CKMB, CKMBINDEX, TROPONINI in the last 72 hours.  BNP: BNP (last 3 results) Recent Labs    10/14/20 0254 10/20/20 0305 10/27/20 0020  BNP 1,097.8* 466.7* 774.9*    ProBNP (last 3 results) No results for input(s): PROBNP in the last 8760 hours.   D-Dimer No results for input(s): DDIMER in the last 72 hours. Hemoglobin A1C No results for input(s): HGBA1C in the last 72 hours. Fasting Lipid Panel No results for input(s): CHOL, HDL, LDLCALC, TRIG, CHOLHDL, LDLDIRECT in the last 72 hours. Thyroid Function Tests No results for input(s): TSH, T4TOTAL, T3FREE, THYROIDAB in the last 72 hours.  Invalid input(s): FREET3  Other results:   Imaging    No results found.   Medications:     Scheduled Medications: . amiodarone   200 mg Oral BID  . aspirin EC  81 mg Oral Daily  . B-complex with vitamin C  1 tablet Oral Daily  . Chlorhexidine Gluconate Cloth  6 each Topical Daily  . collagenase   Topical Daily  . feeding supplement  237 mL Oral TID BM  . gabapentin  100 mg Oral QHS  . insulin aspart  0-20 Units Subcutaneous Q4H  . insulin aspart  10 Units Subcutaneous TID WC  . insulin glargine  25 Units Subcutaneous BID  . lidocaine  1 patch Transdermal Q24H  . mouth rinse  15 mL Mouth Rinse BID  . metoCLOPramide (REGLAN) injection  5 mg Intravenous Q12H  . midodrine  15 mg Oral TID WC  . pantoprazole  40 mg Oral Daily  . polyethylene glycol  17 g Oral BID  . senna-docusate  2 tablet Oral BID  . sodium chloride flush  10-40 mL Intracatheter Q12H  . Warfarin - Pharmacist Dosing Inpatient   Does not apply q1600    Infusions: . lactated ringers    . lactated ringers Stopped (10/17/20 1655)  . prismasol BGK 2/2.5 dialysis solution 1,500 mL/hr at 10/30/20 0946  . prismasol BGK 2/2.5 replacement solution 500 mL/hr at 10/30/20 0601  . prismasol BGK 2/2.5 replacement solution 200 mL/hr at 10/28/20 1525    PRN Medications: acetaminophen, dextrose, dextrose, fentaNYL (SUBLIMAZE) injection, heparin, heparin, ondansetron (ZOFRAN) IV, oxyCODONE, sodium chloride flush, traZODone, white petrolatum    Assessment/Plan   1.Acute on chronic systolic HF -> cardiogenic shock:ICM.HasMedtronic ICD. EF 15%. Low output persisted despite milrinone and then dobutamine with rise in creatinine to 2.5. Impella 5.5 placed 3/1.  Creatinine improved to 1.7 and HM3 LVAD was placed on 3/10. Developed post-op renal failure requiring CVVH. Now off milrinone and epinephrine. Last echo showed moderate RV enlargement with mildly decreased RV function and VAD speed decreased to 5300 due to left-sided septum. CRRT stopped 3/23 but restarted 3/24. CVVH stopped again on 3/27, co-ox 60% today with CVP 16-17.  MAP 70s.  - Plan for HD run today  for volume removal.   - Continue midodrine 15 mg tid.  - Will hold sildenafil today given plan for HD and marginal BP.   2. LVAD: 3/13 VAD speed increased to 5500. 3/23 speed decreased to 5300. VAD interrogated personally. Parameters stable but still frequent PIs.  LDH stable at 246.  - INR 1.9 today on warfarin, on ASA 81 daily. - DL site ok 3. CAD s/p [id number]:Last cath in 6/19 with patent grafts.  - Continue Crestor 40 mg daily.  4. AKI on CKD Stage 3b: Suspect that this is a  combination of cardiorenal syndrome and diabetic nephropathy and possibly ATN. Developed post-op AKI. Started on CRRT 3/15. CVVH stopped 3/23 but restarted 3/24. No urine output. CVP 16-17 today. - As above, plan HD run today.  5. Atrial flutter/fibrillation: s/p DC-CV 08/21/18. NSR today.    - Continue po amiodarone.  6. Wound R Foot: Partial thickness skin loss noted. Cover with mepilex border and change every 5 days.  7. Right arm pain: Think neuropathic, per Dr. Cyndia Bent patient had stretch of brachial plexus with Impella placement.  8. ID:  WBCs normal. Afebrile. Blood Cx- NGTD.  9. BD:ZHGDJME of very poor control but hgbA1ctrending down. Most recent 7.7%.  Insulin drip stopped 3/24.  Now on lantus + meal coverage.  - Diabetes coordinators.  10. Anemia: Stable hgb.   11. Deconditioning - PT/OT following, needs aggressive work.   - will need CIR once medically stable  12. Stage 2 sacral decubitus ulcer: Wound care. Continue reposition every 2 hours.    CRITICAL CARE Performed by: Loralie Champagne  Total critical care time: 35 minutes  Critical care time was exclusive of separately billable procedures and treating other patients.  Critical care was necessary to treat or prevent imminent or life-threatening deterioration.  Critical care was time spent personally by me on the following activities: development of treatment plan with patient and/or surrogate as well as nursing, discussions with consultants,  evaluation of patient's response to treatment, examination of patient, obtaining history from patient or surrogate, ordering and performing treatments and interventions, ordering and review of laboratory studies, ordering and review of radiographic studies, pulse oximetry and re-evaluation of patient's condition.  Loralie Champagne 10/31/2020 7:37 AM

## 2020-10-31 NOTE — Progress Notes (Signed)
Inpatient Rehab Admissions Coordinator Note:   Per therapy updated recommendations, pt was screened for CIR candidacy by Shann Medal, PT, DPT.  At this time we are recommending CIR consult and I will place an order per our protocol.  Please contact me with questions.   Shann Medal, PT, DPT [phone] 10/31/20 11:22 AM

## 2020-10-31 NOTE — Progress Notes (Signed)
ANTICOAGULATION CONSULT NOTE - Follow Up Consult  Pharmacy Consult for Warfarin Indication: LVAD  Allergies  Allergen Reactions  . Meclizine Hcl Anaphylaxis and Swelling  . Ivabradine Nausea Only    Patient Measurements: Height: 5' 10.5" (179.1 cm) Weight: 84.3 kg (185 lb 13.6 oz) IBW/kg (Calculated) : 74.15 Heparin Dosing Weight: 89.2 kg  Vital Signs: Temp: 99.3 F (37.4 C) (03/28 0805) Temp Source: Oral (03/28 0805) BP: 186/151 (03/28 1000) Pulse Rate: 38 (03/28 1000)  Labs: Recent Labs    10/29/20 0430 10/29/20 1600 10/30/20 0404 10/30/20 1622 10/31/20 0420  HGB 9.3*  --  8.9*  --  8.3*  HCT 30.9*  --  29.6*  --  27.2*  PLT 329  --  278  --  277  LABPROT 21.7*  --  20.3*  --  20.7*  INR 2.0*  --  1.8*  --  1.9*  CREATININE 2.21*   < > 1.78* 1.64* 2.68*   < > = values in this interval not displayed.    Estimated Creatinine Clearance: 31.1 mL/min (A) (by C-G formula based on SCr of 2.68 mg/dL (H)).   Medical History: Past Medical History:  Diagnosis Date  . AICD (automatic cardioverter/defibrillator) present    Single-chamber  implantable cardiac defibrillator - Medtronic  . Atrial fibrillation ([place])   . Cataract    Mixed form OU  . CHF (congestive heart failure) ([facility])   . Chronic kidney disease   . Chronic kidney disease (CKD), stage III (moderate) (HCC)   . Chronic systolic heart failure ([place])    a. Echo 5/13: Mild LVE, mild LVH, EF 10%, anteroseptal, lateral, apical AK, mild MR, mild LAE, moderately reduced RVSF, mild RAE, PASP 60;  b. 07/2014 Echo: EF 20-2%, diff HK, AKI of antsep/apical/mid-apicalinferior, mod reduced RV.  Marland Kitchen Coronary artery disease    a. s/p CABG 2002 b. LHC 5/13:  dLM 80%, LAD subtotally occluded, pCFX occluded, pRCA 50%, mid? Occlusion with high take off of the PDA with 70% multiple lesions-not bypassed and supplies collaterals to LAD, LIMA-IM/ramus ok, S-OM ok, S-PLA branches ok. Medical therapy was recommended  . COVID   .  Diabetic retinopathy (HCC)    NPDR OD, PDR OS  . Dyspnea   . Gout    "on daily RX" (01/08/2018)  . Hypertension   . Hypertensive retinopathy    OU  . Ischemic cardiomyopathy    a. Echo 5/13: Mild LVE, mild LVH, EF 10%, anteroseptal, lateral, apical AK, mild MR, mild LAE, moderately reduced RVSF, mild RAE, PASP 60;  b. 01/2012 s/p MDT [id number] Protecta XT VR AICD;  c. 07/2014 Echo: EF 20-2%, diff HK, AKI of antsep/apical/mid-apicalinferior, mod reduced RV.  Marland Kitchen MRSA (methicillin resistant Staphylococcus aureus)    Status post right foot plantar deep infection with MRSA status post  I&D 10/2008  . Myocardial infarction [facility])    "was told I'd had several before heart OR in 2002" (01/08/2018)  . Noncompliance   . Peripheral neuropathy   . Retinopathy, diabetic, background ([place])   . Syncope   . Type II diabetes mellitus ([place])    requiring insulin   . Vitreous hemorrhage, left (HCC)    and proliferative diabetic retinopathy    Assessment: 60 yo M s/p new LVAD implant with HeartMate III on 3/10. Warfarin started per MD on 3/12, and pharmacy asked to take over warfarin dosing.   INR slightly subtherapeutic at 1.9 but up from 1.8. Currently taking 2-3 Ensures daily, eating well. Hgb 8.3. No  overt bleeding. LDH stable 246.   Goal of Therapy:  INR 2-2.5 Monitor platelets by anticoagulation protocol: Yes   Plan:  Warfarin 4 mg PO tonight  Daily PT/INR, CBC, monitor s/sx bleeding  [name], PharmD, BCPS PGY2 Cardiology Pharmacy Resident Phone: [phone] 10/31/2020  10:34 AM  Please check [email or web address] for unit-specific pharmacy phone numbers.

## 2020-10-31 NOTE — Progress Notes (Signed)
Physical Therapy Treatment Patient Details Name: Marcus Johnson. MRN: [id number] DOB: 1960/12/09 Today's Date: 10/31/2020    History of Present Illness 60 y.o. male admitted 2/28 with ICM for LVAD workup. Impella placed 3/1. Heartmate 3 LVAD placed 3/10. CRRT started 3/15. CRRT held 3/23-3/24 then restarted until 3/27 with iHD trial 3/28. PMHx: DM2, CAD s/p CABG 5625, systolic HF due to ischemic cardiomyopathy with EF 20-25% (echo 12/15), DM2 and CKD. He is s/p Medtronic single chamber ICD. Covid 1/22.    PT Comments    Pt very pleasant with significant improvement in cognition, orientation and awareness this session. Pt with conversation, demeanor and recall almost that of preLVAD this session. Pt with improved standing tolerance and transfers limited by BM, sacral pain and HD arrival with need to return to bed. Will continue to follow and hope for transition to battery and an actual gait trial next session. Pt with noted nystagmus and dizziness with return to bed and anticipate further need for vestibular screening and repositioning. Pt also with nose bleed this session  Speed 5350, flow 5.1, power 3.8, PI 1.9-2.8 HR 87-90 SpO2 97% on RA   Follow Up Recommendations  Supervision/Assistance - 24 hour;CIR     Equipment Recommendations  Other (comment);3in1 (PT) (rollator)    Recommendations for Other Services       Precautions / Restrictions Precautions Precautions: Fall;Sternal;Other (comment) Precaution Comments: LVAD, presyncope, sacral wound Restrictions Weight Bearing Restrictions: Yes    Mobility  Bed Mobility Overal bed mobility: Needs Assistance Bed Mobility: Rolling;Sidelying to Sit;Sit to Sidelying Rolling: Min guard Sidelying to sit: Min assist     Sit to sidelying: Min assist General bed mobility comments: pt able to roll to edge for transition to sitting as well as bil in bed for pericare with guarding and cues for sequence. Transition from side to  sitting needing assist to fully clear legs and elevate trunk. Return to bed for HD with assist to bring legs to surface    Transfers Overall transfer level: Needs assistance   Transfers: Sit to/from Stand;Stand Pivot Transfers Sit to Stand: Min guard Stand pivot transfers: Min guard       General transfer comment: pt able to stand from bed, BSC and recliner with cues for hands on thighs x total of 6 trials. pt pivoted bed>BSc and end of session recliner>bed with only single assist with bil UE supported on therapist arms  Ambulation/Gait Ambulation/Gait assistance: Min assist;+2 safety/equipment Gait Distance (Feet): 10 Feet Assistive device: 1 person hand held assist Gait Pattern/deviations: Step-through pattern;Decreased stride length   Gait velocity interpretation: <1.8 ft/sec, indicate of risk for recurrent falls General Gait Details: pt face to face holding onto therapist arms for walking. pt able to walk 4' BSC to recliner, 4' forward then back at recliner (8') then additional trial forward and back 5' (10'). Pt did report initial dizziness with sitting and slight dizziness in standing but no episodes of presyncope (freezing/jerking) in standing today   Stairs             Wheelchair Mobility    Modified Rankin (Stroke Patients Only)       Balance Overall balance assessment: Needs assistance Sitting-balance support: No upper extremity supported;Feet supported Sitting balance-Leahy Scale: Fair Sitting balance - Comments: EOB without assist   Standing balance support: Bilateral upper extremity supported Standing balance-Leahy Scale: Poor Standing balance comment: UE support for stepping and standing  Cognition Arousal/Alertness: Awake/alert Behavior During Therapy: WFL for tasks assessed/performed Overall Cognitive Status: Impaired/Different from baseline Area of Impairment: Problem solving                              Problem Solving: Slow processing General Comments: Pt with significantly improved cognition able to recall all precautions, name and purpose of controller and begin to recall process of power transition      Exercises      General Comments        Pertinent Vitals/Pain Pain Score: 6  Pain Location: sacrum Pain Descriptors / Indicators: Discomfort;Grimacing;Aching Pain Intervention(s): Limited activity within patient's tolerance;Monitored during session;Repositioned    Home Living                      Prior Function            PT Goals (current goals can now be found in the care plan section) Progress towards PT goals: Progressing toward goals    Frequency    Min 4X/week      PT Plan Current plan remains appropriate    Co-evaluation              AM-PAC PT "6 Clicks" Mobility   Outcome Measure  Help needed turning from your back to your side while in a flat bed without using bedrails?: A Little Help needed moving from lying on your back to sitting on the side of a flat bed without using bedrails?: A Little Help needed moving to and from a bed to a chair (including a wheelchair)?: A Little Help needed standing up from a chair using your arms (e.g., wheelchair or bedside chair)?: A Little Help needed to walk in hospital room?: A Lot Help needed climbing 3-5 steps with a railing? : Total 6 Click Score: 15    End of Session Equipment Utilized During Treatment: Gait belt Activity Tolerance: Patient tolerated treatment well Patient left: in bed;with call bell/phone within reach;with nursing/sitter in room Nurse Communication: Mobility status PT Visit Diagnosis: Other abnormalities of gait and mobility (R26.89);Muscle weakness (generalized) (M62.81);Difficulty in walking, not elsewhere classified (R26.2);Pain     Time: 0092-3300 PT Time Calculation (min) (ACUTE ONLY): 46 min  Charges:  $Therapeutic Exercise: 8-22 mins $Therapeutic Activity:  23-37 mins                     Kayani Rapaport P, PT Acute Rehabilitation Services Pager: [phone] Office: [place] 10/31/2020, 10:00 AM

## 2020-10-31 NOTE — Progress Notes (Signed)
LVAD Coordinator Rounding Note:  Admitted 09/28/20 due to worsening heart failure symptoms. Recent COVID infection January 2022; did not take medications for 3-4 weeks.   S/p dental extractions 10/07/20 per Dr Benson Norway.  HM III LVAD implanted on 10/13/20 by Dr Cyndia Bent under Destination Therapy criteria.  Pt lying in bed. PT/OT and his bedside RNs at bedside, repositioning patient in bed. Pt c/o 3/10 sacral pain. Per bedside RN patient was OOB to chair this morning. Reports he was dizzy/lightheaded with standing.   Pt had nosebleed this morning and passed a few large clots. Pressure held by bedside RN; hemostasis achieved.   Pt is a 2-assist with movement, requiring steady lift for transfers. Reports continued weakness/numbness of his right hand. He has ball and putty at bedside, and a list of exercises that he is to complete throughout the day to increase dexterity in his hand. Will need extensive therapy in order to independently perform power source changes/handle VAD equipment. Encouraged patient to complete exercises after he completes HD this morning.   Pt is having hydrotherapy on his sacral wound wound 6 days a week Monday-Saturday. Continue turning from side to side to alleviate pressure on wound bed.    Intermittent HD started this morning with goal of 2L as tolerated based on his blood pressure.   Vital signs: Temp: 99.1  HR: 71 Doppler: 84 Auto cuff: 90/62 (71) O2 Sat: 97% on RA Wt: 200.1>221.7>219.5..........182.7>174.8>177.2>183.2>180>175>178>185.8 lbs    LVAD interrogation reveals:  Speed: 5300 Flow: 5.2 Power: 3.8w PI: 2.0 Hematocrit: 31  Alarms: none Events: few events this morning; >100 PI events yesterday & Saturday  Fixed speed: 5300 Low speed limit: 5000  Drive Line: Existing VAD dressing removed and site care performed using sterile technique. Drive line exit site cleaned with Chlora prep applicators x 2, allowed to dry, and gauze dressing with silver strip  re-applied; skin tears healing. NO skin prep used due to skin intolerance of same. Exit site healing and unincorporated the velour is fully implanted at exit site. There is 1 suture visible that is wrapped around the driveline. Small amount of brown drainage from fat necrosis. No redness, tenderness, foul odor or rash noted. Drive line anchor correctly applied. Continue daily dressing changes by VAD coordinator or nurse champion. Next dressing change due 10/31/20.      Labs:  LDH trend: 444>670>575>571.....370>361>327>310>306>281>259>246  INR trend: 1.4>1.7>1.8>1.7>1.6>1.8............2.0>2.2>2.1>1.8>1.9  Creatinine: 1.84>3.0>3.4>2.73.....1.76>1.68>2.55>3.53>2.54>2.6>2.2>1.78>2.68  Anticoagulation Plan: -INR Goal: 2.0-2.5 -ASA Dose: 81 mg  Blood Products:  IntraOp:   1 Platelet  6 FFP  3 PRBC  1035 cell saver Postop:   10/13/20:   4 FFP  4 PRBC    10/14/20:  1 FFP  1 PRBC   10/16/20:  1 PRBC   10/18/20:  1 PRBC   10/16/20:  1 PRBC   10/18/20:  1 PRBC   10/27/20:  1 PRBC  Device: -  Medtronic single - Therapies: off   Arrythmias: VT in OR requiring defibrillation   Renal: CRRT   - started 10/19/20   - stopped 10/26/20   - re-started 10/27/20  Infection: - 10/27/20 blood cultures - no growth x 4 days, final pending  Patient Education: 1. Patient states he is tired and uncomfortable, and HD treatment occuring; teaching not appropriate at this time. No family at bedside. Pt's son is on vacation. Pt unsure when his brother will come to visit this week.   Plan/Recommendations:  1. Call VAD coordinator for equipment or drive line issues 2. Daily drive line  dressing change per VAD coordinator or nurse champion. Next dressing change due 11/01/20.  Emerson Monte RN [place] Coordinator  Office: [phone]  24/7 Pager: [phone]

## 2020-10-31 NOTE — Progress Notes (Addendum)
[facility] NEPHROLOGY PROGRESS NOTE  Assessment/ Plan:  Acute kidney injury on chronic kidney disease stage III: With underlying chronic kidney disease likely from diabetes/hypertension and worsening renal function from hemodynamically mediated ATN and cardiorenal syndrome. CRRT 3/15-3/22 -Unfortunately, no signs of renal recovery at the moment  -CRRT restarted 3/24 - 3/27. Plan for IHD trial today, UF goal will be 2L as tolerated. Use low temp, albumin support if needed and profile 2.  On systemic heparin. -strict I/O  Hyponatremia -CVP's16-17 today, likely hypervolemic. Pain is also definitely a contributing factor. Pain ctrl per primary service. Na being managed with renal replacement therapy  Acute exacerbation of chronic systolic heart failure with cardiogenic shock: LVAD, placed 3/10. off epinephrine now.  Volume status/UF managed with RRT  Anemia:Likely secondary to chronic disease and exacerbated by recent surgical losses. Cont ESA qMon.   Transfuse PRBC as needed.   Atrial fibrillation: per cardiology  Hypophosphatemia: repleting prn  Leukocytosis: improved, w/u per primary  Protein calorie malnutrition, hypoalbuminemia: push protein, per primary  Pressure ulcer -per primary, see above for pain control  Jannifer Hick MD [facility] Pager [phone]  Subjective: no complaints from patient this AM.  In bed awaiting HD. D/w RN bedside. Sildenafil held today in prep for BP with HD.   Objective Vital signs in last 24 hours: Vitals:   10/31/20 0700 10/31/20 0755 10/31/20 0800 10/31/20 0805  BP:  (!) 83/72    Pulse: 86  88   Resp: (!) 26  (!) 23   Temp:    99.3 F (37.4 C)  TempSrc:    Oral  SpO2: 96%  99%   Weight:      Height:       Weight change: 3.2 kg  Intake/Output Summary (Last 24 hours) at 10/31/2020 0900 Last data filed at 10/31/2020 0400 Gross per 24 hour  Intake 717 ml  Output 443 ml  Net 274 ml       Labs: Basic  Metabolic Panel: Recent Labs  Lab 10/30/20 0404 10/30/20 1622 10/31/20 0420  NA 132* 131* 132*  K 3.9 3.8 4.1  CL 97* 98 97*  CO2 28 27 26   GLUCOSE 91 104* 68*  BUN 24* 21* 33*  CREATININE 1.78* 1.64* 2.68*  CALCIUM 8.5* 8.3* 8.7*  PHOS 2.1* 1.8* 1.6*   Liver Function Tests: Recent Labs  Lab 10/30/20 0404 10/30/20 1622 10/31/20 0420  ALBUMIN 2.3* 2.4* 2.3*   No results for input(s): LIPASE, AMYLASE in the last 168 hours. No results for input(s): AMMONIA in the last 168 hours. CBC: Recent Labs  Lab 10/27/20 0424 10/27/20 2159 10/28/20 0512 10/29/20 0430 10/30/20 0404 10/31/20 0420  WBC 17.1*  --  14.5* 12.3* 9.8 11.3*  HGB 7.8*   < > 9.2* 9.3* 8.9* 8.3*  HCT 25.8*   < > 29.9* 30.9* 29.6* 27.2*  MCV 91.5  --  90.1 90.9 91.9 90.7  PLT 348  --  325 329 278 277   < > = values in this interval not displayed.   Cardiac Enzymes: No results for input(s): CKTOTAL, CKMB, CKMBINDEX, TROPONINI in the last 168 hours. CBG: Recent Labs  Lab 10/30/20 1953 10/31/20 0043 10/31/20 0422 10/31/20 0525 10/31/20 0808  GLUCAP 92 133* 58* 92 104*    Iron Studies: No results for input(s): IRON, TIBC, TRANSFERRIN, FERRITIN in the last 72 hours. Studies/Results: No results found.  Medications: Infusions: . lactated ringers    . lactated ringers Stopped (10/17/20 1655)  . prismasol  BGK 2/2.5 dialysis solution 1,500 mL/hr at 10/30/20 0946  . prismasol BGK 2/2.5 replacement solution 500 mL/hr at 10/30/20 0601  . prismasol BGK 2/2.5 replacement solution 200 mL/hr at 10/28/20 1525    Scheduled Medications: . amiodarone  200 mg Oral BID  . aspirin EC  81 mg Oral Daily  . B-complex with vitamin C  1 tablet Oral Daily  . Chlorhexidine Gluconate Cloth  6 each Topical Daily  . collagenase   Topical Daily  . feeding supplement  237 mL Oral TID BM  . gabapentin  100 mg Oral QHS  . insulin aspart  0-20 Units Subcutaneous Q4H  . insulin aspart  10 Units Subcutaneous TID WC  .  insulin glargine  20 Units Subcutaneous BID  . lidocaine  1 patch Transdermal Q24H  . mouth rinse  15 mL Mouth Rinse BID  . metoCLOPramide (REGLAN) injection  5 mg Intravenous Q12H  . midodrine  15 mg Oral TID WC  . pantoprazole  40 mg Oral Daily  . polyethylene glycol  17 g Oral BID  . senna-docusate  2 tablet Oral BID  . sodium chloride flush  10-40 mL Intracatheter Q12H  . Warfarin - Pharmacist Dosing Inpatient   Does not apply q1600    have reviewed scheduled and prn medications.  Physical Exam: General: NAD, sitting up in bed  Heart: Mechanical heart sound/lvad hum Lungs: cta bl Abdomen:soft, Non-tender, non-distended Extremities: very trace pitting edema bl Dialysis Access: Temporary RIJ HD catheter.  Ria Comment A Madolyn Ackroyd 10/31/2020,9:00 AM  LOS: 33 days

## 2020-10-31 NOTE — Progress Notes (Signed)
Physical Therapy Wound Treatment Patient Details  Name: Marcus Johnson. MRN: [id number] Date of Birth: March 08, 1961  Today's Date: 10/31/2020 Time: 8325-4982 Time Calculation (min): 29 min  Subjective  Subjective Assessment Subjective: "Is this going to hurt as much as the first dose?" Patient and Family Stated Goals: did not state Date of Onset:  (unknown) Prior Treatments: dressing change  Pain Score:  8/10  Wound Assessment  Pressure Injury 10/16/20 Coccyx Mid Stage 2 -  Partial thickness loss of dermis presenting as a shallow open injury with a red, pink wound bed without slough. Blister that has drained, 2 cm X 4.5 cm (Active)  Dressing Type ABD;Barrier Film (skin prep);Moist to dry;Santyl;Normal saline moist dressing 10/31/20 1029  Dressing Changed;Clean;Dry;Intact 10/31/20 1029  Dressing Change Frequency Daily 10/31/20 1029  State of Healing Early/partial granulation 10/31/20 1029  Site / Wound Assessment Yellow;Brown;Pink 10/31/20 1029  % Wound base Red or Granulating 30% 10/29/20 1100  % Wound base Yellow/Fibrinous Exudate 70% 10/29/20 1100  % Wound base Black/Eschar 0% 10/29/20 1100  % Wound base Other/Granulation Tissue (Comment) 0% 10/29/20 1100  Peri-wound Assessment Intact 10/31/20 1029  Wound Length (cm) 4 cm 10/29/20 1100  Wound Width (cm) 7 cm 10/29/20 1100  Wound Depth (cm) 0.1 cm 10/29/20 1100  Wound Surface Area (cm^2) 28 cm^2 10/29/20 1100  Wound Volume (cm^3) 2.8 cm^3 10/29/20 1100  Margins Unattached edges (unapproximated) 10/31/20 1029  Drainage Amount Scant 10/31/20 1029  Drainage Description Serous 10/31/20 1029  Treatment Debridement (Selective);Hydrotherapy (Pulse lavage);Packing (Saline gauze) 10/31/20 1029      Hydrotherapy Pulsed lavage therapy - wound location: coccyx Pulsed Lavage with Suction (psi): 8 psi Pulsed Lavage with Suction - Normal Saline Used: 1000 mL Pulsed Lavage Tip: Tip with splash shield Selective  Debridement Selective Debridement - Location: coccyx Selective Debridement - Tools Used: Forceps,Scalpel Selective Debridement - Tissue Removed: yellow/brown slough    Wound Assessment and Plan  Wound Therapy - Assess/Plan/Recommendations Wound Therapy - Clinical Statement: Progressing with removal of yellow/brown slough. Will benefit from selective debridement, pulsatile lavage and Santyl to cleanse wound and decrease amount of necrotic tissue. Wound Therapy - Functional Problem List: decreased mobility Factors Delaying/Impairing Wound Healing: Immobility,Multiple medical problems Hydrotherapy Plan: Debridement,Dressing change,Patient/family education,Pulsatile lavage with suction Wound Therapy - Frequency: 6X / week Wound Therapy - Follow Up Recommendations: dressing changes by RN  Wound Therapy Goals- Improve the function of patient's integumentary system by progressing the wound(s) through the phases of wound healing (inflammation - proliferation - remodeling) by: Wound Therapy Goals - Improve the function of patient's integumentary system by progressing the wound(s) through the phases of wound healing by: Decrease Necrotic Tissue to: 20 Decrease Necrotic Tissue - Progress: Progressing toward goal Increase Granulation Tissue to: 80 Increase Granulation Tissue - Progress: Progressing toward goal Goals/treatment plan/discharge plan were made with and agreed upon by patient/family: Yes Time For Goal Achievement: 7 days Wound Therapy - Potential for Goals: Good  Goals will be updated until maximal potential achieved or discharge criteria met.  Discharge criteria: when goals achieved, discharge from hospital, MD decision/surgical intervention, no progress towards goals, refusal/missing three consecutive treatments without notification or medical reason.  GP     Charges PT Wound Care Charges $Wound Debridement up to 20 cm: < or equal to 20 cm $PT Hydrotherapy Dressing: 1 dressing $PT  PLS Gun and Tip: 1 Supply $PT Hydrotherapy Visit: 1 Visit  Wyona Almas, PT, DPT Acute Rehabilitation Services Pager [phone] Office [phone]  Deno Etienne 10/31/2020, 10:32 AM

## 2020-10-31 NOTE — Progress Notes (Signed)
Inpatient Diabetes Program Recommendations  AACE/ADA: New Consensus Statement on Inpatient Glycemic Control   Target Ranges:  Prepandial:   less than 140 mg/dL      Peak postprandial:   less than 180 mg/dL (1-2 hours)      Critically ill patients:  140 - 180 mg/dL   Results for TABB, CROGHAN (MRN [id number]) as of 10/31/2020 08:51  Ref. Range 10/30/2020 06:47 10/30/2020 9:46 10/30/2020 11:13 10/30/2020 16:15 10/30/2020 19:53 10/30/2020 23:01 10/31/2020 00:43 10/31/2020 04:22 10/31/2020 05:25 10/31/2020 08:08  Glucose-Capillary Latest Ref Range: 70 - 99 mg/dL 64 (L)     Lantus 25 units 148 (H)  Novolog 13 units   104 (H)  Novolog 10 units 92     Lantus 25 units 133 (H)  Novolog 3 units 58 (L) 92 104 (H)   Review of Glycemic Control  Diabetes history:DM2 Outpatient Diabetes medications:Jardiance 10 mg daily, Novolog 5 units TID with meals, Lantus 50 units daily Current orders for Inpatient glycemic control: Lantus 20 units BID, Novolog 0-20 units Q4H, Novolog 10 units TID with meals  Inpatient Diabetes Program Recommendations:    Insulin: Noted Lantus decreased to 20 units BID today.  Please consider changing CBGs to AC&HS and Novolog to 0-20 units TID with meals and Novolog 0-5 units QHS.  Thanks, Barnie Alderman, RN, MSN, CDE Diabetes Coordinator Inpatient Diabetes Program [phone] (Team Pager from 8am to 5pm)

## 2020-10-31 NOTE — Progress Notes (Addendum)
Nutrition Follow Up  DOCUMENTATION CODES:   Not applicable  INTERVENTION:   Continue liberalized diet of REGULAR   Continue Ensure Enlive po TID, each supplement provides 350 kcal and 20 grams of protein  Add 30 ml ProSource Plus BID, each supplement provides 100 kcals and 15 grams protein  Transition to rena-vit   Check labs Vitamin A, Vitamin C, and zinc given prolonged time on CRRT and wound status  NUTRITION DIAGNOSIS:   Increased nutrient needs related to chronic illness (CHF) as evidenced by estimated needs.  Ongoing  GOAL:   Patient will meet greater than or equal to 90% of their needs   Progressing   MONITOR:   PO intake,Supplement acceptance,Labs,Weight trends,Skin,I & O's  REASON FOR ASSESSMENT:   Consult LVAD Eval  ASSESSMENT:   Marcus Johnson. is a 60 y.o. male who has a h/o DM2, CAD s/p CABG 1937, systolic HF due to ischemic cardiomyopathy with EF 20-25% (echo 12/15), DM2 and CKD. He is s/p Medtronic single chamber ICD.Pt admitted with CHF.  3/01- s/p impella  3/04- s/p multiple tooth extraction  3/10- s/p impella removal, LVAD implantation  3/11- extubated  3/14- Cortrak placed, continuous feeds started 3/15- CRRT started 3/22- CRRT stopped 3/24- CRRT restarted  3/27- CRRT stopped  Pt discussed during ICU rounds and with RN.   Off pressors. First HD treatment today. Plan hydrotherapy for sacral wound. Appetite stable. Last 5 meal completions charted as 90%, 40%, 100%, 100%, and 100%. Taking Ensure at least twice daily. Add ProSource to provide more protein. Check vitamin labs given prolonged time on CRRT.   NFPE repeated. Bilateral lower extremities show moderate depletions, suspect this is largely due to immobility.   Admission weight: 97.5 kg  Current weight: 84.3 kg (stable over last week)  UOP: 50 ml x 24 hrs  HD (1st tx): 490 ml net UF  Medications: SS novolog, lantus, 5 mg reglan, k phos, miralax, senokot Labs: Na 132 (L)  Phosphorus 1.6 (L) Mg 2.5 (H) CBG 58-119  Flowsheet Row Most Recent Value  Orbital Region No depletion  Upper Arm Region Mild depletion  Thoracic and Lumbar Region No depletion  Buccal Region No depletion  Temple Region No depletion  Clavicle Bone Region No depletion  Clavicle and Acromion Bone Region Mild depletion  Scapular Bone Region Unable to assess  Dorsal Hand No depletion  Patellar Region Moderate depletion  Anterior Thigh Region Moderate depletion  Posterior Calf Region Moderate depletion  Edema (RD Assessment) None  Hair Reviewed  Eyes Reviewed  Mouth Reviewed  Skin Reviewed  Nails Reviewed     Diet Order:   Diet Order            Diet regular Room service appropriate? Yes; Fluid consistency: Thin  Diet effective now                 EDUCATION NEEDS:   Education needs have been addressed  Skin:  Skin Assessment: Skin Integrity Issues: Skin Integrity Issues:: Incisions Incisions: chest x3 Stage II: coccyx x3 Other: skin tear R chest, skin tear L arm, abdomen  Last BM:  3/27  Height:   Ht Readings from Last 1 Encounters:  10/21/20 5' 10.5" (1.791 m)    Weight:   Wt Readings from Last 1 Encounters:  10/31/20 84.3 kg    Ideal Body Weight:  76.8 kg  BMI:  Body mass index is 26.29 kg/m.  Estimated Nutritional Needs:   Kcal:  2250-2450 kcal  Protein:  115-130 grams  Fluid:  1000 ml + UOP  Mariana Single RD, LDN Clinical Nutrition Pager listed in [place]

## 2020-10-31 NOTE — Progress Notes (Signed)
CSW attempted to visit patient at bedside although patient resting comfortably and did not want to disturb. No family at bedside at the time of visit. CSW continues to follow. Raquel Sarna, [place], [place]

## 2020-11-01 DIAGNOSIS — I5023 Acute on chronic systolic (congestive) heart failure: Secondary | ICD-10-CM | POA: Diagnosis not present

## 2020-11-01 DIAGNOSIS — Z95811 Presence of heart assist device: Secondary | ICD-10-CM | POA: Diagnosis not present

## 2020-11-01 LAB — CULTURE, BLOOD (ROUTINE X 2)
Culture: NO GROWTH
Culture: NO GROWTH
Special Requests: ADEQUATE
Special Requests: ADEQUATE

## 2020-11-01 LAB — CBC
HCT: 27.1 % — ABNORMAL LOW (ref 39.0–52.0)
HCT: 27.3 % — ABNORMAL LOW (ref 39.0–52.0)
Hemoglobin: 8.4 g/dL — ABNORMAL LOW (ref 13.0–17.0)
Hemoglobin: 8.3 g/dL — ABNORMAL LOW (ref 13.0–17.0)
MCH: 28 pg (ref 26.0–34.0)
MCH: 27.5 pg (ref 26.0–34.0)
MCHC: 31 g/dL (ref 30.0–36.0)
MCHC: 30.4 g/dL (ref 30.0–36.0)
MCV: 90.3 fL (ref 80.0–100.0)
MCV: 90.4 fL (ref 80.0–100.0)
Platelets: 279 10*3/uL (ref 150–400)
Platelets: 301 10*3/uL (ref 150–400)
RBC: 3 MIL/uL — ABNORMAL LOW (ref 4.22–5.81)
RBC: 3.02 MIL/uL — ABNORMAL LOW (ref 4.22–5.81)
RDW: 17.3 % — ABNORMAL HIGH (ref 11.5–15.5)
RDW: 17.2 % — ABNORMAL HIGH (ref 11.5–15.5)
WBC: 11 10*3/uL — ABNORMAL HIGH (ref 4.0–10.5)
WBC: 12.2 10*3/uL — ABNORMAL HIGH (ref 4.0–10.5)
nRBC: 0 % (ref 0.0–0.2)
nRBC: 0 % (ref 0.0–0.2)

## 2020-11-01 LAB — RENAL FUNCTION PANEL
Albumin: 2.3 g/dL — ABNORMAL LOW (ref 3.5–5.0)
Anion gap: 8 (ref 5–15)
BUN: 35 mg/dL — ABNORMAL HIGH (ref 6–20)
CO2: 28 mmol/L (ref 22–32)
Calcium: 8.7 mg/dL — ABNORMAL LOW (ref 8.9–10.3)
Chloride: 98 mmol/L (ref 98–111)
Creatinine, Ser: 3.11 mg/dL — ABNORMAL HIGH (ref 0.61–1.24)
GFR, Estimated: 22 mL/min — ABNORMAL LOW (ref >60)
Glucose, Bld: 86 mg/dL (ref 70–99)
Phosphorus: 2 mg/dL — ABNORMAL LOW (ref 2.5–4.6)
Potassium: 4.1 mmol/L (ref 3.5–5.1)
Sodium: 134 mmol/L — ABNORMAL LOW (ref 135–145)

## 2020-11-01 LAB — COOXEMETRY PANEL
Carboxyhemoglobin: 1.8 % — ABNORMAL HIGH (ref 0.5–1.5)
Methemoglobin: 0.8 % (ref 0.0–1.5)
O2 Saturation: 51.5 %
Total hemoglobin: 13 g/dL (ref 12.0–16.0)

## 2020-11-01 LAB — MAGNESIUM: Magnesium: 2.3 mg/dL (ref 1.7–2.4)

## 2020-11-01 LAB — GLUCOSE, CAPILLARY
Glucose-Capillary: 101 mg/dL — ABNORMAL HIGH (ref 70–99)
Glucose-Capillary: 157 mg/dL — ABNORMAL HIGH (ref 70–99)
Glucose-Capillary: 166 mg/dL — ABNORMAL HIGH (ref 70–99)
Glucose-Capillary: 125 mg/dL — ABNORMAL HIGH (ref 70–99)

## 2020-11-01 LAB — LACTATE DEHYDROGENASE: LDH: 244 U/L — ABNORMAL HIGH (ref 98–192)

## 2020-11-01 LAB — PROTIME-INR
INR: 1.7 — ABNORMAL HIGH (ref 0.8–1.2)
Prothrombin Time: 19.6 seconds — ABNORMAL HIGH (ref 11.4–15.2)

## 2020-11-01 MED ORDER — PENTAFLUOROPROP-TETRAFLUOROETH EX AERO
1.0000 "application " | INHALATION_SPRAY | CUTANEOUS | Status: DC | PRN
  Filled 2020-11-01: qty 116

## 2020-11-01 MED ORDER — SENNOSIDES-DOCUSATE SODIUM 8.6-50 MG PO TABS: 2.0000 | ORAL_TABLET | Freq: Every evening | ORAL | Status: DC | PRN

## 2020-11-01 MED ORDER — POLYETHYLENE GLYCOL 3350 17 G PO PACK
17.0000 g | PACK | Freq: Every day | ORAL | Status: DC
  Filled 2020-11-01 (×7): qty 1

## 2020-11-01 MED ORDER — K PHOS MONO-SOD PHOS DI & MONO 155-852-130 MG PO TABS
250.0000 mg | ORAL_TABLET | Freq: Four times a day (QID) | ORAL | Status: AC
  Administered 2020-11-01 (×3): 250 mg via ORAL
  Filled 2020-11-01 (×3): qty 1

## 2020-11-01 MED ORDER — CHLORHEXIDINE GLUCONATE CLOTH 2 % EX PADS
6.0000 | MEDICATED_PAD | Freq: Every day | CUTANEOUS | Status: DC
  Administered 2020-11-04 – 2020-11-14 (×11): 6 via TOPICAL

## 2020-11-01 MED ORDER — SODIUM CHLORIDE 0.9 % IV SOLN: 100.0000 mL | INTRAVENOUS | Status: DC | PRN

## 2020-11-01 MED ORDER — LIDOCAINE HCL (PF) 1 % IJ SOLN: 5.0000 mL | INTRAMUSCULAR | Status: DC | PRN

## 2020-11-01 MED ORDER — HEPARIN SODIUM (PORCINE) 1000 UNIT/ML DIALYSIS
1000.0000 [IU] | INTRAMUSCULAR | Status: DC | PRN
  Administered 2020-11-01: 2400 [IU] via INTRAVENOUS_CENTRAL
  Administered 2020-11-10: 1000 [IU] via INTRAVENOUS_CENTRAL
  Filled 2020-11-01 (×5): qty 1

## 2020-11-01 MED ORDER — WARFARIN SODIUM 3 MG PO TABS
6.0000 mg | ORAL_TABLET | Freq: Once | ORAL | Status: AC
  Administered 2020-11-01: 6 mg via ORAL
  Filled 2020-11-01: qty 2

## 2020-11-01 MED ORDER — LIDOCAINE-PRILOCAINE 2.5-2.5 % EX CREA
1.0000 "application " | TOPICAL_CREAM | CUTANEOUS | Status: DC | PRN
  Filled 2020-11-01: qty 5

## 2020-11-01 MED ORDER — ALTEPLASE 2 MG IJ SOLR
2.0000 mg | Freq: Once | INTRAMUSCULAR | Status: DC | PRN
  Filled 2020-11-01: qty 2

## 2020-11-01 NOTE — Progress Notes (Signed)
ANTICOAGULATION CONSULT NOTE - Follow Up Consult  Pharmacy Consult for Warfarin Indication: LVAD  Allergies  Allergen Reactions  . Meclizine Hcl Anaphylaxis and Swelling  . Ivabradine Nausea Only    Patient Measurements: Height: 5' 10.5" (179.1 cm) Weight: 82.1 kg (181 lb) IBW/kg (Calculated) : 74.15 Heparin Dosing Weight: 89.2 kg  Vital Signs: Temp: 99 F (37.2 C) (03/29 0700) Temp Source: Oral (03/29 0700) BP: 73/55 (03/29 0600) Pulse Rate: 61 (03/29 0600)  Labs: Recent Labs    10/30/20 0404 10/30/20 1622 10/31/20 0420 10/31/20 1651 11/01/20 0513 11/01/20 0521  HGB 8.9*  --  8.3*  --   --  8.4*  HCT 29.6*  --  27.2*  --   --  27.1*  PLT 278  --  277  --   --  279  LABPROT 20.3*  --  20.7*  --  19.6*  --   INR 1.8*  --  1.9*  --  1.7*  --   CREATININE 1.78*   < > 2.68* 2.33* 3.11*  --    < > = values in this interval not displayed.    Estimated Creatinine Clearance: 26.8 mL/min (A) (by C-G formula based on SCr of 3.11 mg/dL (H)).   Medical History: Past Medical History:  Diagnosis Date  . AICD (automatic cardioverter/defibrillator) present    Single-chamber  implantable cardiac defibrillator - Medtronic  . Atrial fibrillation ([place])   . Cataract    Mixed form OU  . CHF (congestive heart failure) ([place])   . Chronic kidney disease   . Chronic kidney disease (CKD), stage III (moderate) (HCC)   . Chronic systolic heart failure ([place])    a. Echo 5/13: Mild LVE, mild LVH, EF 10%, anteroseptal, lateral, apical AK, mild MR, mild LAE, moderately reduced RVSF, mild RAE, PASP 60;  b. 07/2014 Echo: EF 20-2%, diff HK, AKI of antsep/apical/mid-apicalinferior, mod reduced RV.  Marland Kitchen Coronary artery disease    a. s/p CABG 2002 b. LHC 5/13:  dLM 80%, LAD subtotally occluded, pCFX occluded, pRCA 50%, mid? Occlusion with high take off of the PDA with 70% multiple lesions-not bypassed and supplies collaterals to LAD, LIMA-IM/ramus ok, S-OM ok, S-PLA branches ok. Medical therapy was  recommended  . COVID   . Diabetic retinopathy (HCC)    NPDR OD, PDR OS  . Dyspnea   . Gout    "on daily RX" (01/08/2018)  . Hypertension   . Hypertensive retinopathy    OU  . Ischemic cardiomyopathy    a. Echo 5/13: Mild LVE, mild LVH, EF 10%, anteroseptal, lateral, apical AK, mild MR, mild LAE, moderately reduced RVSF, mild RAE, PASP 60;  b. 01/2012 s/p MDT [id number] Protecta XT VR AICD;  c. 07/2014 Echo: EF 20-2%, diff HK, AKI of antsep/apical/mid-apicalinferior, mod reduced RV.  Marland Kitchen MRSA (methicillin resistant Staphylococcus aureus)    Status post right foot plantar deep infection with MRSA status post  I&D 10/2008  . Myocardial infarction [facility])    "was told I'd had several before heart OR in 2002" (01/08/2018)  . Noncompliance   . Peripheral neuropathy   . Retinopathy, diabetic, background ([place])   . Syncope   . Type II diabetes mellitus ([place])    requiring insulin   . Vitreous hemorrhage, left (HCC)    and proliferative diabetic retinopathy    Assessment: 60 yo M s/p new LVAD implant with HeartMate III on 3/10. Warfarin started per MD on 3/12, and pharmacy asked to take over warfarin dosing.   INR  subtherapeutic at 1.7 today. Currently taking 2-3 Ensures daily, eating very well. Hgb 8.4. No overt bleeding. LDH stable 244.   Goal of Therapy:  INR 2-2.5 Monitor platelets by anticoagulation protocol: Yes   Plan:  Warfarin 6 mg PO tonight  Daily PT/INR, CBC, monitor s/sx bleeding  [name], PharmD, BCPS PGY2 Cardiology Pharmacy Resident Phone: [phone] 11/01/2020  7:28 AM  Please check [email or web address] for unit-specific pharmacy phone numbers.

## 2020-11-01 NOTE — Progress Notes (Signed)
Occupational Therapy Treatment Patient Details Name: Marcus Johnson. MRN: [id number] DOB: 01-12-1961 Today's Date: 11/01/2020    History of present illness 60 y.o. male admitted 2/28 with ICM for LVAD workup. Impella placed 3/1. Heartmate 3 LVAD placed 3/10. CRRT started 3/15. CRRT held 3/23-3/24 then restarted until 3/27 with iHD trial 3/28. PMHx: DM2, CAD s/p CABG 1856, systolic HF due to ischemic cardiomyopathy with EF 20-25% (echo 12/15), DM2 and CKD. He is s/p Medtronic single chamber ICD. Covid 1/22.   OT comments  Pt seen while up in chair. Pt participated in ADL tasks in addition to [place] with focus on RUE. Pt progressing with RUE strength however continues to demonstrate increased weakness in apparent median n distribution. Thumb loop and buddy strap positioned to work on functional pinch patterns. Nsg educated on how to donn straps. Pt encouraged to work on activities throughout the day. A gel foam cushion (belongs to CIR) positioned in chair to attempt to improve comfort with sitting. Goals updated. Continue to recommend CIR when medically appropriate. Pt very appreciative.   Follow Up Recommendations  CIR;Supervision/Assistance - 24 hour    Equipment Recommendations  Other (comment) (TBA)    Recommendations for Other Services Rehab consult    Precautions / Restrictions Precautions Precautions: Fall;Sternal;Other (comment) Precaution Booklet Issued: Yes (comment) Precaution Comments: LVAD, presyncope, sacral wound; R IJ dialysis catheter Required Braces or Orthoses: Other Brace Other Brace: thumb loop and buddy strap R hand Restrictions Weight Bearing Restrictions: Yes (sternal precautions) Other Position/Activity Restrictions: sternal precautions       Mobility Bed Mobility               General bed mobility comments: OOB in chair    Transfers Overall transfer level: Needs assistance Equipment used: Rolling walker (2 wheeled) Transfers:  Sit to/from Stand Sit to Stand: Min assist         General transfer comment: good use of hands on knees to stand    Balance Overall balance assessment: Needs assistance   Sitting balance-Leahy Scale: Fair       Standing balance-Leahy Scale: Poor                             ADL either performed or assessed with clinical judgement   ADL Overall ADL's : Needs assistance/impaired     Grooming: Sitting;Minimal assistance   Upper Body Bathing: Sitting;Minimal assistance   Lower Body Bathing: Moderate assistance;Sit to/from stand Lower Body Bathing Details (indicate cue type and reason): A for pericare and lower legs. painful when reaching (sacral pain) Upper Body Dressing : Moderate assistance;Sitting   Lower Body Dressing: Sit to/from stand;Maximal assistance   Toilet Transfer: Minimal assistance Toilet Transfer Details (indicate cue type and reason): simulated Toileting- Clothing Manipulation and Hygiene: Maximal assistance       Functional mobility during ADLs: Minimal assistance;Rolling walker General ADL Comments: Iimproved ability to "stay in the tube"; ADL limited by RUE function in additionto sacral pain caused by movement     Vision       Perception     Praxis      Cognition Arousal/Alertness: Awake/alert Behavior During Therapy: WFL for tasks assessed/performed Overall Cognitive Status: Impaired/Different from baseline                     Current Attention Level: Selective Memory: Decreased short-term memory Following Commands: Follows one step commands consistently   Awareness: Emergent Problem Solving:  Slow processing General Comments: cognition  improving but not at baseline        Exercises General Exercises - Upper Extremity Shoulder Flexion: AAROM;AROM;Both;10 reps;Seated Elbow Extension: AAROM;AROM;Both;10 reps (passive stretch into full extension) Wrist Flexion: Right;AROM;AAROM;10 reps;Seated Digit Composite  Flexion: AROM;AAROM;Right;10 reps;Seated (passive stretch adn ROM iof index into full ROM) Other Exercises Other Exercises: pinch/grip strengthening x 10 Other Exercises: thumb strap used to increase opposition; index finger buddy taped to middle finger to improve functional position for pinch. pt worked on using R hand as functional assist while unscrewing top to simulate connector Other Exercises: scapular retraction and  depression x 10   Shoulder Instructions       General Comments      Pertinent Vitals/ Pain       Pain Assessment: Faces Faces Pain Scale: Hurts even more Pain Location: sacrum Pain Descriptors / Indicators: Discomfort;Grimacing;Aching Pain Intervention(s): Limited activity within patient's tolerance  Home Living                                          Prior Functioning/Environment              Frequency  Min 2X/week        Progress Toward Goals  OT Goals(current goals can now be found in the care plan section)  Progress towards OT goals: Progressing toward goals;Goals met and updated - see care plan  Acute Rehab OT Goals Patient Stated Goal: to go home and be with his grandchildren OT Goal Formulation: With patient Time For Goal Achievement: 11/14/20 Potential to Achieve Goals: Good ADL Goals Pt Will Perform Grooming: with set-up;sitting Pt Will Transfer to Toilet: with supervision;bedside commode;ambulating  Plan Discharge plan remains appropriate    Co-evaluation                 AM-PAC OT "6 Clicks" Daily Activity     Outcome Measure   Help from another person eating meals?: A Little Help from another person taking care of personal grooming?: A Little Help from another person toileting, which includes using toliet, bedpan, or urinal?: A Lot Help from another person bathing (including washing, rinsing, drying)?: A Lot Help from another person to put on and taking off regular upper body clothing?: A Lot Help  from another person to put on and taking off regular lower body clothing?: A Lot 6 Click Score: 14    End of Session Equipment Utilized During Treatment: Rolling walker  OT Visit Diagnosis: Unsteadiness on feet (R26.81);Other abnormalities of gait and mobility (R26.89);Muscle weakness (generalized) (M62.81);Other symptoms and signs involving cognitive function;Dizziness and giddiness (R42);Pain Pain - part of body:  (sacrum)   Activity Tolerance Patient tolerated treatment well (although painful)   Patient Left in chair;with call bell/phone within reach;with chair alarm set;with nursing/sitter in room   Nurse Communication Mobility status;Other (comment) (use of R hand strap)        Time: 9485-4627 OT Time Calculation (min): 27 min  Charges: OT General Charges $OT Visit: 1 Visit OT Treatments $Self Care/Home Management : 8-22 mins $Therapeutic Exercise: 8-22 mins  Maurie Boettcher, OT/L   Acute OT Clinical Specialist Acute Rehabilitation Services Pager [phone] Office [phone]    [facility] 11/01/2020, 9:52 AM

## 2020-11-01 NOTE — Progress Notes (Signed)
Inpatient Rehab Admissions Coordinator:   Met with patient briefly at bedside to introduce myself and possibility of inpatient rehab when medically stable.  Pt is very lethargic, but interested.  We would need to get insurance approval, which we can start closer to pt being ready.  Note still needing HD daily at this point, and IV pain medications.  Will continue to follow and discuss CIR goals/expectations once pt more alert.   Shann Medal, PT, DPT Admissions Coordinator [phone] 11/01/20  2:58 PM

## 2020-11-01 NOTE — Progress Notes (Signed)
Physical Therapy Wound Treatment Patient Details  Name: Marcus Johnson. MRN: [id number] Date of Birth: 26-Dec-1960  Today's Date: 11/01/2020 Time: 1224-4975 Time Calculation (min): 32 min  Subjective  Subjective Assessment Subjective: "I walked 75 feet today." Patient and Family Stated Goals: did not state Date of Onset:  (unknown) Prior Treatments: dressing change  Pain Score:  8/10  Wound Assessment  Pressure Injury 10/16/20 Coccyx Mid Stage 2 -  Partial thickness loss of dermis presenting as a shallow open injury with a red, pink wound bed without slough. Blister that has drained, 2 cm X 4.5 cm (Active)  Dressing Type ABD;Barrier Film (skin prep);Moist to dry;Normal saline moist dressing;Santyl 11/01/20 1300  Dressing Changed;Clean;Dry;Intact 11/01/20 1300  Dressing Change Frequency Daily 11/01/20 1300  State of Healing Early/partial granulation 11/01/20 1300  Site / Wound Assessment Pink;Pale;Yellow 11/01/20 1300  % Wound base Red or Granulating 60% 11/01/20 1300  % Wound base Yellow/Fibrinous Exudate 40% 11/01/20 1300  % Wound base Black/Eschar 0% 11/01/20 1300  % Wound base Other/Granulation Tissue (Comment) 0% 11/01/20 1300  Peri-wound Assessment Intact 11/01/20 1300  Wound Length (cm) 4 cm 10/29/20 1100  Wound Width (cm) 7 cm 10/29/20 1100  Wound Depth (cm) 0.1 cm 10/29/20 1100  Wound Surface Area (cm^2) 28 cm^2 10/29/20 1100  Wound Volume (cm^3) 2.8 cm^3 10/29/20 1100  Margins Unattached edges (unapproximated) 11/01/20 1300  Drainage Amount Scant 11/01/20 1300  Drainage Description Serous 11/01/20 1300  Treatment Debridement (Selective);Hydrotherapy (Pulse lavage);Packing (Saline gauze) 11/01/20 1300     Hydrotherapy Pulsed lavage therapy - wound location: coccyx Pulsed Lavage with Suction (psi): 8 psi Pulsed Lavage with Suction - Normal Saline Used: 1000 mL Pulsed Lavage Tip: Tip with splash shield Selective Debridement Selective Debridement - Location:  coccyx Selective Debridement - Tools Used: Forceps,Scalpel Selective Debridement - Tissue Removed: yellow slough    Wound Assessment and Plan  Wound Therapy - Assess/Plan/Recommendations Wound Therapy - Clinical Statement: Granulation tissue increasing with few areas of yellow slough remaining. Will benefit from selective debridement, pulsatile lavage and Santyl to cleanse wound and decrease amount of necrotic tissue. Wound Therapy - Functional Problem List: decreased mobility Factors Delaying/Impairing Wound Healing: Immobility,Multiple medical problems Hydrotherapy Plan: Debridement,Dressing change,Patient/family education,Pulsatile lavage with suction Wound Therapy - Frequency: 6X / week Wound Therapy - Follow Up Recommendations: dressing changes by RN  Wound Therapy Goals- Improve the function of patient's integumentary system by progressing the wound(s) through the phases of wound healing (inflammation - proliferation - remodeling) by: Wound Therapy Goals - Improve the function of patient's integumentary system by progressing the wound(s) through the phases of wound healing by: Decrease Necrotic Tissue to: 20 Decrease Necrotic Tissue - Progress: Progressing toward goal Increase Granulation Tissue to: 80 Increase Granulation Tissue - Progress: Progressing toward goal Goals/treatment plan/discharge plan were made with and agreed upon by patient/family: Yes Time For Goal Achievement: 7 days Wound Therapy - Potential for Goals: Good  Goals will be updated until maximal potential achieved or discharge criteria met.  Discharge criteria: when goals achieved, discharge from hospital, MD decision/surgical intervention, no progress towards goals, refusal/missing three consecutive treatments without notification or medical reason.  GP     Wyona Almas, PT, DPT Acute Rehabilitation Services Pager [phone] Office [phone]   Charges PT Wound Care Charges $Wound Debridement up  to 20 cm: < or equal to 20 cm $PT Hydrotherapy Dressing: 1 dressing $PT PLS Gun and Tip: 1 Supply $PT Hydrotherapy Visit: 1 Visit       [name]  Margo Aye 11/01/2020, 2:04 PM

## 2020-11-01 NOTE — Progress Notes (Signed)
LVAD Coordinator Rounding Note:  Admitted 09/28/20 due to worsening heart failure symptoms. Recent COVID infection January 2022; did not take medications for 3-4 weeks.   S/p dental extractions 10/07/20 per Dr Benson Norway.  HM III LVAD implanted on 10/13/20 by Dr Cyndia Bent under Destination Therapy criteria.  Pt lying in bed. PT at bedside performing hydrotherapy sacral wound dressing change. Per bedside RN patient was OOB to chair this morning and he walked 75 ft in the hallway to the charge desk. He was able to stand with minimal assist and shuffle back to bed from his recliner for dressing change.   Reports continued weakness/numbness of his right hand. He has ball and putty at bedside, and a list of exercises that he is to complete throughout the day to increase dexterity in his hand. Will need extensive therapy in order to independently perform power source changes/handle VAD equipment. Encouraged patient and bedside RN to complete exercises several times a day to increase hand strength.   Pt is having hydrotherapy on his sacral wound wound 6 days a week Monday-Saturday. Continue turning from side to side to alleviate pressure on wound bed.      Intermittent HD started yesterday; tolerated well. Plan for HD again today for further volume removal.  Plan for echo tomorrow.   Vital signs: Temp: 98.7 HR: 84 Doppler: 86 Auto cuff: 97/83 (89) O2 Sat: 100% on RA Wt: 200.1>221.7>219.5..........182.7>174.8>177.2>183.2>180>175>178>185.8>181 lbs    LVAD interrogation reveals:  Speed: 5300 Flow: 4.7 Power: 3.9w PI: 3.5 Hematocrit: 31  Alarms: none Events: 30 PI events so far today  Fixed speed: 5300 Low speed limit: 5000  Drive Line: Existing VAD dressing removed and site care performed using sterile technique. Drive line exit site cleaned with Chlora prep applicators x 2, allowed to dry, and gauze dressing with silver strip re-applied; skin tears healing. NO skin prep used due to skin  intolerance of same. Exit site healing and unincorporated the velour is fully implanted at exit site. There is 1 suture visible that is wrapped around the driveline. Small amount of brown drainage from fat necrosis. No redness, tenderness, foul odor or rash noted. Drive line anchor correctly applied. Continue daily dressing changes by VAD coordinator or nurse champion. Next dressing change due 11/02/20.     Labs:  LDH trend: 444>670>575>571.....370>361>327>310>306>281>259>246>244  INR trend: 1.4>1.7>1.8>1.7>1.6>1.8............2.0>2.2>2.1>1.8>1.9>1.7  Creatinine: 1.84>3.0>3.4>2.73.....1.76>1.68>2.55>3.53>2.54>2.6>2.2>1.78>2.68>3.11  Anticoagulation Plan: -INR Goal: 2.0-2.5 -ASA Dose: 81 mg  Blood Products:  IntraOp:   1 Platelet  6 FFP  3 PRBC  1035 cell saver Postop:   10/13/20:   4 FFP  4 PRBC    10/14/20:  1 FFP  1 PRBC   10/16/20:  1 PRBC   10/18/20:  1 PRBC   10/16/20:  1 PRBC   10/18/20:  1 PRBC   10/27/20:  1 PRBC  Device: -  Medtronic single - Therapies: off   Arrythmias: VT in OR requiring defibrillation   Renal: CRRT   - started 10/19/20   - stopped 10/26/20   - re-started 10/27/20  - stopped 10/30/20   iHD started 10/31/20   Infection: - 10/27/20 blood cultures - no growth x 5 days; final   Patient Education: 1. Pt able to verbalize steps to change power source, and how to get battery in and out of clips. He attempted to perform power source change but he has poor dexterity and weakness of his right hand, therefore is unable to perform power source change independently at this time. Encouraged to continue hand  exercises.  2. Pt's son is on vacation and will be unable to come to the hospital for teaching until next week. Pt's brother [name] is scheduled to come tomorrow 11/02/20 at 1300 to continue education.   Plan/Recommendations:  1. Call VAD coordinator for equipment or drive line issues 2. Daily drive line dressing change per VAD coordinator or  nurse champion. Next dressing change due 11/02/20.  Emerson Monte RN [place] Coordinator  Office: [phone]  24/7 Pager: [phone]

## 2020-11-01 NOTE — Progress Notes (Addendum)
Physical Therapy Treatment Patient Details Name: Marcus Johnson. MRN: [id number] DOB: 03/17/1961 Today's Date: 11/01/2020    History of Present Illness 60 y.o. male admitted 2/28 with ICM for LVAD workup. Impella placed 3/1. Heartmate 3 LVAD placed 3/10. CRRT started 3/15. CRRT held 3/23-3/24 then restarted until 3/27 with iHD trial 3/28. PMHx: DM2, CAD s/p CABG 0175, systolic HF due to ischemic cardiomyopathy with EF 20-25% (echo 12/15), DM2 and CKD. He is s/p Medtronic single chamber ICD. Covid 1/22.    PT Comments    Pt very pleasant and progressing well this session and able to walk in hallway. Pt educated for power transition, donning holster and black bag contents with assist for power transition and donning/doffing holster. Pt able to recall precautions and trying to perform battery management and line management but unable due to RUE weakness. Pt educated for progression and happy.   Speed 5200, power 3.9, flow 5.0, PI 1.7-2.4 HR 90-91 Pre gait 85/66 (73) Post 79/65 (71)    Follow Up Recommendations  Supervision/Assistance - 24 hour;CIR     Equipment Recommendations  Other (comment);3in1 (PT) (rollator)    Recommendations for Other Services       Precautions / Restrictions Precautions Precautions: Fall;Sternal;Other (comment) Precaution Booklet Issued: Yes (comment) Precaution Comments: LVAD, presyncope, sacral wound; R IJ dialysis catheter Required Braces or Orthoses: Other Brace  Restrictions Weight Bearing Restrictions: Yes (sternal precautions) Other Position/Activity Restrictions: sternal precautions    Mobility  Bed Mobility Overal bed mobility: Needs Assistance Bed Mobility: Rolling;Sidelying to Sit Rolling: Min guard Sidelying to sit: Min assist       General bed mobility comments: cues for sequence with assist to lift trunk from surface    Transfers Overall transfer level: Needs assistance Equipment used: Rolling walker (2  wheeled) Transfers: Sit to/from Stand Sit to Stand: Min assist         General transfer comment: cues for hands on thighs with pt able to stand 3x from bed and recliner  Ambulation/Gait Ambulation/Gait assistance: Min assist;+2 safety/equipment Gait Distance (Feet): 50 Feet Assistive device: Rolling walker (2 wheeled) Gait Pattern/deviations: Step-through pattern;Decreased stride length   Gait velocity interpretation: <1.8 ft/sec, indicate of risk for recurrent falls General Gait Details: cues for posture and position in [place]. Pt walked 30', 28', 46' with RW and close chair follow. Pt tolerating well with PI 1.8-2.4 throughout and no episodes of presyncope   Stairs             Wheelchair Mobility    Modified Rankin (Stroke Patients Only)       Balance Overall balance assessment: Needs assistance Sitting-balance support: No upper extremity supported;Feet supported Sitting balance-Leahy Scale: Fair Sitting balance - Comments: EOB without assist   Standing balance support: Bilateral upper extremity supported Standing balance-Leahy Scale: Poor Standing balance comment: UE support for stepping and standing                            Cognition Arousal/Alertness: Awake/alert Behavior During Therapy: WFL for tasks assessed/performed Overall Cognitive Status: Impaired/Different from baseline Area of Impairment: Problem solving                   Current Attention Level: Selective Memory: Decreased short-term memory Following Commands: Follows one step commands consistently   Awareness: Emergent Problem Solving: Slow processing General Comments: cognition much improved with pt able to recall all sternal precautions but continues to need step by step cues  and assist for power transition      Exercises     General Comments        Pertinent Vitals/Pain Pain Assessment: Faces Pain Score: 6  Faces Pain Scale: Hurts even more Pain Location:  sacrum Pain Descriptors / Indicators: Discomfort;Grimacing;Aching Pain Intervention(s): Limited activity within patient's tolerance;Monitored during session;Repositioned    Home Living                      Prior Function            PT Goals (current goals can now be found in the care plan section) Acute Rehab PT Goals Patient Stated Goal: to go home and be with his grandchildren Progress towards PT goals: Progressing toward goals    Frequency    Min 4X/week      PT Plan Current plan remains appropriate    Co-evaluation              AM-PAC PT "6 Clicks" Mobility   Outcome Measure  Help needed turning from your back to your side while in a flat bed without using bedrails?: A Little Help needed moving from lying on your back to sitting on the side of a flat bed without using bedrails?: A Little Help needed moving to and from a bed to a chair (including a wheelchair)?: A Little Help needed standing up from a chair using your arms (e.g., wheelchair or bedside chair)?: A Little Help needed to walk in hospital room?: A Lot Help needed climbing 3-5 steps with a railing? : Total 6 Click Score: 15    End of Session Equipment Utilized During Treatment: Gait belt Activity Tolerance: Patient tolerated treatment well Patient left: in chair;with call bell/phone within reach;with chair alarm set Nurse Communication: Mobility status PT Visit Diagnosis: Other abnormalities of gait and mobility (R26.89);Muscle weakness (generalized) (M62.81);Difficulty in walking, not elsewhere classified (R26.2);Pain     Time: 0258-5277 PT Time Calculation (min) (ACUTE ONLY): 41 min  Charges:  $Gait Training: 8-22 mins $Therapeutic Activity: 8-22 mins                     Shantale Holtmeyer P, PT Acute Rehabilitation Services Pager: [phone] Office: [place] 11/01/2020, 10:08 AM

## 2020-11-01 NOTE — Progress Notes (Addendum)
Patient ID: Marcus Bayus., male   DOB: Apr 04, 1961, 60 y.o.   MRN: [id number]     Advanced Heart Failure Rounding Note  PCP-Cardiologist: No primary care provider on file.   Subjective:    - 2/27 Milrinone switched to dobutamine 5 mcg.  - 2/28 Moved to ICU. Dobutamine increased to 7.5 mcg and Norepi 3 mcg added. Diuretics held.  - 3/1 Impella 5.5 placed.  - 3/4 Teeth removed - 3/10 HM3 LVAD placed - 3/13 VAD speed increased to 5500. [name] out  - 3/14 Nephrology consulted. Given 1UPRBCs  - 3/15 Started CRRT. Given 1UPRBCs.  - 3/23 VAD Speed turned down to 5300  - 3/23 CRRT and Milrinone Stopped. Back in Afib - 3/24 CRRT restarted.  - 3/27 CVVH off.  -3/28 iHD  INR 1.7, LDH 244  Slept better last night. Complaining of pain from pressure ulcer. 7 bms yesterday.   Echo: EF 20-25%, normal RV, PASP 61, dilated IVC.   LVAD Interrogation HM 3: Speed: 5300 Flow: 4.9  PI: 2.4  Power: 3.8.  Multiple PI events  Objective:   Weight Range: 82.1 kg Body mass index is 25.6 kg/m.   Vital Signs:   Temp:  [98.5 F (36.9 C)-99.6 F (37.6 C)] 99 F (37.2 C) (03/29 0700) Pulse Rate:  [30-99] 61 (03/29 0600) Resp:  [3-24] 14 (03/29 0600) BP: (65-196)/(45-163) 73/55 (03/29 0600) SpO2:  [90 %-100 %] 98 % (03/29 0600) Weight:  [82.1 kg] 82.1 kg (03/29 0500) Last BM Date: 10/31/20  Weight change: Filed Weights   10/30/20 0700 10/31/20 0500 11/01/20 0500  Weight: 81.1 kg 84.3 kg 82.1 kg    Intake/Output:   Intake/Output Summary (Last 24 hours) at 11/01/2020 0710 Last data filed at 10/31/2020 2100 Gross per 24 hour  Intake 957 ml  Output 1550 ml  Net -593 ml      Physical Exam  Maps 70s CVP 17-18 Physical Exam: GENERAL: No acute distress. Has HD catheter on right HEENT: normal  NECK: Supple, JVP to jaw .  2+ bilaterally, no bruits.  No lymphadenopathy or thyromegaly appreciated.   CARDIAC:  Mechanical heart sounds with LVAD hum present.  LUNGS:  Clear to  auscultation bilaterally.  ABDOMEN:  Soft, round, nontender, positive bowel sounds x4.     LVAD exit site: Dressing dry and intact.  No erythema or drainage.  Stabilization device present and accurately applied.  Driveline dressing is being changed daily per sterile technique. EXTREMITIES:  Warm and dry, no cyanosis, clubbing, rash or edema . RUE PICC NEUROLOGIC:  Alert and oriented x 3.    No aphasia.  No dysarthria.  Affect pleasant.    SKIN: Buttock pressure ulcer.  Telemetry   NSR 80s    Labs    CBC Recent Labs    10/31/20 0420 11/01/20 0521  WBC 11.3* 11.0*  HGB 8.3* 8.4*  HCT 27.2* 27.1*  MCV 90.7 90.3  PLT 277 154   Basic Metabolic Panel Recent Labs    10/31/20 0420 10/31/20 1651 11/01/20 0513  NA 132* 133* 134*  K 4.1 3.9 4.1  CL 97* 97* 98  CO2 26 28 28   GLUCOSE 68* 82 86  BUN 33* 22* 35*  CREATININE 2.68* 2.33* 3.11*  CALCIUM 8.7* 8.6* 8.7*  MG 2.5*  --  2.3  PHOS 1.6* 1.4* 2.0*   Liver Function Tests Recent Labs    10/31/20 1651 11/01/20 0513  ALBUMIN 2.4* 2.3*   No results for input(s): LIPASE, AMYLASE in the  last 72 hours. Cardiac Enzymes No results for input(s): CKTOTAL, CKMB, CKMBINDEX, TROPONINI in the last 72 hours.  BNP: BNP (last 3 results) Recent Labs    10/14/20 0254 10/20/20 0305 10/27/20 0020  BNP 1,097.8* 466.7* 774.9*    ProBNP (last 3 results) No results for input(s): PROBNP in the last 8760 hours.   D-Dimer No results for input(s): DDIMER in the last 72 hours. Hemoglobin A1C No results for input(s): HGBA1C in the last 72 hours. Fasting Lipid Panel No results for input(s): CHOL, HDL, LDLCALC, TRIG, CHOLHDL, LDLDIRECT in the last 72 hours. Thyroid Function Tests No results for input(s): TSH, T4TOTAL, T3FREE, THYROIDAB in the last 72 hours.  Invalid input(s): FREET3  Other results:   Imaging    No results found.   Medications:     Scheduled Medications: . (feeding supplement) PROSource Plus  30 mL Oral  BID BM  . amiodarone  200 mg Oral BID  . aspirin EC  81 mg Oral Daily  . Chlorhexidine Gluconate Cloth  6 each Topical Daily  . collagenase   Topical Daily  . feeding supplement  237 mL Oral TID BM  . gabapentin  100 mg Oral QHS  . insulin aspart  0-20 Units Subcutaneous TID WC  . insulin aspart  0-5 Units Subcutaneous QHS  . insulin aspart  10 Units Subcutaneous TID WC  . insulin glargine  20 Units Subcutaneous BID  . lidocaine  1 patch Transdermal Q24H  . metoCLOPramide (REGLAN) injection  5 mg Intravenous Q12H  . midodrine  15 mg Oral TID WC  . multivitamin  1 tablet Oral QHS  . pantoprazole  40 mg Oral Daily  . polyethylene glycol  17 g Oral BID  . senna-docusate  2 tablet Oral BID  . sodium chloride flush  10-40 mL Intracatheter Q12H  . Warfarin - Pharmacist Dosing Inpatient   Does not apply q1600    Infusions: . lactated ringers    . lactated ringers Stopped (10/17/20 1655)    PRN Medications: acetaminophen, dextrose, dextrose, fentaNYL (SUBLIMAZE) injection, heparin, ondansetron (ZOFRAN) IV, oxyCODONE, sodium chloride flush, traZODone, white petrolatum    Assessment/Plan   1.Acute on chronic systolic HF -> cardiogenic shock:ICM.HasMedtronic ICD. EF 15%. Low output persisted despite milrinone and then dobutamine with rise in creatinine to 2.5. Impella 5.5 placed 3/1.  Creatinine improved to 1.7 and HM3 LVAD was placed on 3/10. Developed post-op renal failure requiring CVVH. Now off milrinone and epinephrine. Last echo showed moderate RV enlargement with mildly decreased RV function and VAD speed decreased to 5300 due to left-sided septum. CRRT stopped 3/23 but restarted 3/24. CVVH stopped again on 3/27. Tolerated iHD  - co-ox 51.5% today. Off pressors.  - Continue midodrine 15 mg tid.  - Off sildenafil.  2. LVAD: 3/13 VAD speed increased to 5500. 3/23 speed decreased to 5300. VAD interrogated personally. Parameters stable but still frequent PIs.  LDH stable at 244   - INR 1.7 today on warfarin, on ASA 81 daily. Drinking supplements. Discussed warfarin dose with pharmacy.  - DL site ok 3. CAD s/p CABG2002:Last cath in 6/19 with patent grafts.  - Continue Crestor 40 mg daily.  4. AKI on CKD Stage 3b: Suspect that this is a combination of cardiorenal syndrome and diabetic nephropathy and possibly ATN. Developed post-op AKI. Started on CRRT 3/15. CVVH stopped 3/23 but restarted 3/24.  - Tolerated iHD  5. Atrial flutter/fibrillation: s/p DC-CV 08/21/18. NSR today.     - Continue po amiodarone.  6. Wound R Foot: Partial thickness skin loss noted. Cover with mepilex border and change every 5 days.  7. Right arm pain: Think neuropathic, per Dr. Cyndia Bent patient had stretch of brachial plexus with Impella placement.  8. ID:  WBCs normal. Afebrile. Blood Cx- NGTD.  9. OP:FYTWKMQ of very poor control but hgbA1ctrending down. Most recent 7.7%.  Insulin drip stopped 3/24.  Now on lantus. Stop meal coverage.   - Diabetes coordinators.  10. Anemia: Stable hgb.   11. Deconditioning - PT/OT following, needs aggressive work.   - CIR consult  12. Unstageable Pressure Ulcer on buttokcs.  - On hydrotherapy Mon-Sat.  - Continue reposition every 2 hours side lying position. Reposition frequently in the chair.   Frequent BMS. Stop daily Senakot,  Reglan, and cut miralax to daily.   Place CIR consult today. Will need aggressive therapy.   Amy Clegg NP-C  11/01/2020 7:10 AM  Patient seen with NP, agree with the above note.   Had HD yesterday, tolerated well.  Had about 50 cc UOP.  MAP 70s.  CVP still 17-18.   General: Well appearing this am. NAD.  HEENT: Normal. Neck: Supple, JVP 14-16 cm. Carotids OK.  Cardiac:  Mechanical heart sounds with LVAD hum present.  Lungs:  CTAB, normal effort.  Abdomen:  NT, ND, no HSM. No bruits or masses. +BS  LVAD exit site: Well-healed and incorporated. Dressing dry and intact. No erythema or drainage. Stabilization device  present and accurately applied. Driveline dressing changed daily per sterile technique. Extremities:  Warm and dry. No cyanosis, clubbing, rash, or edema.  Neuro:  Alert & oriented x 3. Cranial nerves grossly intact. Moves all 4 extremities w/o difficulty. Affect pleasant    MAP is good when checked by doppler (runs lower by cuff).  Would like him to get HD again today for fluid removal.  Discussed with Dr. Johnney Ou.   Will get echo again tomorrow after more fluid off via HD.   Needs ongoing work with PT. Will need CIR.   Increase warfarin.   Remains in NSR on amiodarone.   CRITICAL CARE Performed by: Loralie Champagne  Total critical care time: 35 minutes  Critical care time was exclusive of separately billable procedures and treating other patients.  Critical care was necessary to treat or prevent imminent or life-threatening deterioration.  Critical care was time spent personally by me on the following activities: development of treatment plan with patient and/or surrogate as well as nursing, discussions with consultants, evaluation of patient's response to treatment, examination of patient, obtaining history from patient or surrogate, ordering and performing treatments and interventions, ordering and review of laboratory studies, ordering and review of radiographic studies, pulse oximetry and re-evaluation of patient's condition.  Loralie Champagne 11/01/2020 8:04 AM

## 2020-11-01 NOTE — Plan of Care (Signed)
  Problem: Clinical Measurements: Goal: Ability to maintain clinical measurements within normal limits will improve Outcome: Progressing Goal: Will remain free from infection Outcome: Progressing   Problem: Activity: Goal: Risk for activity intolerance will decrease Outcome: Progressing   

## 2020-11-01 NOTE — Progress Notes (Signed)
[facility] NEPHROLOGY PROGRESS NOTE  Assessment/ Plan:  Acute kidney injury on chronic kidney disease stage III: With underlying chronic kidney disease likely from diabetes/hypertension and worsening renal function from hemodynamically mediated ATN and cardiorenal syndrome. CRRT 3/15-3/22 -Unfortunately, no signs of renal recovery at the moment  -CRRT restarted 3/24 - 3/27. Tolerated UF 1.5L with HD 3/28 -Plan UF treatment today for more volume removal.  Try 2.5h 3L DUF.  -Will need CLIP to outpt unit with LVAD, TDC and eval for perm access prior to d/c.  It's going to be a long road with his LVAD, deconditioning but he wishes to try.  -strict I/O  Hyponatremia -CVP's16-18today, likely hypervolemic. Pain is also definitely a contributing factor. Pain ctrl per primary service. Na being managed with renal replacement therapy  Acute exacerbation of chronic systolic heart failure with cardiogenic shock: LVAD, placed 3/10. off epinephrine now.  Volume status/UF managed with RRT  Anemia:Likely secondary to chronic disease and exacerbated by recent surgical losses. Cont ESA qMon.   Transfuse PRBC as needed.   Atrial fibrillation: per cardiology  Hypophosphatemia: repleting prn  Protein calorie malnutrition, hypoalbuminemia: push protein, per primary  Pressure ulcers per primary, see above for pain control  Jannifer Hick MD [facility] Pager [phone]  Subjective: no complaints from patient this AM.  Tolerated HD 1.5L UF yesterday. CVP 16 this AM, request for more UF.   Objective Vital signs in last 24 hours: Vitals:   11/01/20 0800 11/01/20 0830 11/01/20 0900 11/01/20 1000  BP:  (!) 79/65  97/83  Pulse: 91 96 89   Resp: 20 (!) 23 19 16   Temp:      TempSrc:      SpO2: 99% 100% 97%   Weight:      Height:       Weight change: -2.2 kg  Intake/Output Summary (Last 24 hours) at 11/01/2020 1034 Last data filed at 11/01/2020 0800 Gross per 24 hour   Intake 957 ml  Output 1550 ml  Net -593 ml       Labs: Basic Metabolic Panel: Recent Labs  Lab 10/31/20 0420 10/31/20 1651 11/01/20 0513  NA 132* 133* 134*  K 4.1 3.9 4.1  CL 97* 97* 98  CO2 26 28 28   GLUCOSE 68* 82 86  BUN 33* 22* 35*  CREATININE 2.68* 2.33* 3.11*  CALCIUM 8.7* 8.6* 8.7*  PHOS 1.6* 1.4* 2.0*   Liver Function Tests: Recent Labs  Lab 10/31/20 0420 10/31/20 1651 11/01/20 0513  ALBUMIN 2.3* 2.4* 2.3*   No results for input(s): LIPASE, AMYLASE in the last 168 hours. No results for input(s): AMMONIA in the last 168 hours. CBC: Recent Labs  Lab 10/28/20 0512 10/29/20 0430 10/30/20 0404 10/31/20 0420 11/01/20 0521  WBC 14.5* 12.3* 9.8 11.3* 11.0*  HGB 9.2* 9.3* 8.9* 8.3* 8.4*  HCT 29.9* 30.9* 29.6* 27.2* 27.1*  MCV 90.1 90.9 91.9 90.7 90.3  PLT 325 329 278 277 279   Cardiac Enzymes: No results for input(s): CKTOTAL, CKMB, CKMBINDEX, TROPONINI in the last 168 hours. CBG: Recent Labs  Lab 10/31/20 0808 10/31/20 1117 10/31/20 1649 10/31/20 2142 11/01/20 0701  GLUCAP 104* 119* 87 188* 101*    Iron Studies: No results for input(s): IRON, TIBC, TRANSFERRIN, FERRITIN in the last 72 hours. Studies/Results: No results found.  Medications: Infusions: . lactated ringers    . lactated ringers Stopped (10/17/20 1655)    Scheduled Medications: . (feeding supplement) PROSource Plus  30 mL Oral BID BM  .  amiodarone  200 mg Oral BID  . aspirin EC  81 mg Oral Daily  . Chlorhexidine Gluconate Cloth  6 each Topical Daily  . Chlorhexidine Gluconate Cloth  6 each Topical Q0600  . collagenase   Topical Daily  . feeding supplement  237 mL Oral TID BM  . gabapentin  100 mg Oral QHS  . insulin aspart  0-20 Units Subcutaneous TID WC  . insulin aspart  0-5 Units Subcutaneous QHS  . insulin glargine  20 Units Subcutaneous BID  . midodrine  15 mg Oral TID WC  . multivitamin  1 tablet Oral QHS  . pantoprazole  40 mg Oral Daily  . polyethylene glycol   17 g Oral Daily  . sodium chloride flush  10-40 mL Intracatheter Q12H  . Warfarin - Pharmacist Dosing Inpatient   Does not apply q1600    have reviewed scheduled and prn medications.  Physical Exam: General: NAD, sitting up in bed  Heart: Mechanical heart sound/lvad hum Lungs: cta bl Abdomen:soft, Non-tender, non-distended Extremities: very trace pitting edema bl Dialysis Access: Temporary RIJ HD catheter.  Ria Comment A Tye Vigo 11/01/2020,10:34 AM  LOS: 34 days

## 2020-11-02 ENCOUNTER — Inpatient Hospital Stay (HOSPITAL_COMMUNITY): Payer: Medicare HMO

## 2020-11-02 ENCOUNTER — Telehealth (HOSPITAL_COMMUNITY): Payer: Self-pay | Admitting: Pharmacy Technician

## 2020-11-02 DIAGNOSIS — I5023 Acute on chronic systolic (congestive) heart failure: Secondary | ICD-10-CM | POA: Diagnosis not present

## 2020-11-02 DIAGNOSIS — I351 Nonrheumatic aortic (valve) insufficiency: Secondary | ICD-10-CM | POA: Diagnosis not present

## 2020-11-02 DIAGNOSIS — I361 Nonrheumatic tricuspid (valve) insufficiency: Secondary | ICD-10-CM

## 2020-11-02 DIAGNOSIS — I5043 Acute on chronic combined systolic (congestive) and diastolic (congestive) heart failure: Secondary | ICD-10-CM

## 2020-11-02 DIAGNOSIS — Z95811 Presence of heart assist device: Secondary | ICD-10-CM

## 2020-11-02 LAB — RENAL FUNCTION PANEL
Albumin: 2.4 g/dL — ABNORMAL LOW (ref 3.5–5.0)
Anion gap: 11 (ref 5–15)
BUN: 63 mg/dL — ABNORMAL HIGH (ref 6–20)
CO2: 26 mmol/L (ref 22–32)
Calcium: 9 mg/dL (ref 8.9–10.3)
Chloride: 95 mmol/L — ABNORMAL LOW (ref 98–111)
Creatinine, Ser: 4.93 mg/dL — ABNORMAL HIGH (ref 0.61–1.24)
GFR, Estimated: 13 mL/min — ABNORMAL LOW (ref >60)
Glucose, Bld: 207 mg/dL — ABNORMAL HIGH (ref 70–99)
Phosphorus: 3 mg/dL (ref 2.5–4.6)
Potassium: 4.7 mmol/L (ref 3.5–5.1)
Sodium: 132 mmol/L — ABNORMAL LOW (ref 135–145)

## 2020-11-02 LAB — PROTIME-INR
INR: 1.9 — ABNORMAL HIGH (ref 0.8–1.2)
Prothrombin Time: 21.3 seconds — ABNORMAL HIGH (ref 11.4–15.2)

## 2020-11-02 LAB — COOXEMETRY PANEL
Carboxyhemoglobin: 2 % — ABNORMAL HIGH (ref 0.5–1.5)
Methemoglobin: 1 % (ref 0.0–1.5)
O2 Saturation: 56.1 %
Total hemoglobin: 8.9 g/dL — ABNORMAL LOW (ref 12.0–16.0)

## 2020-11-02 LAB — GLUCOSE, CAPILLARY
Glucose-Capillary: 154 mg/dL — ABNORMAL HIGH (ref 70–99)
Glucose-Capillary: 151 mg/dL — ABNORMAL HIGH (ref 70–99)
Glucose-Capillary: 117 mg/dL — ABNORMAL HIGH (ref 70–99)
Glucose-Capillary: 206 mg/dL — ABNORMAL HIGH (ref 70–99)

## 2020-11-02 LAB — ECHOCARDIOGRAM LIMITED
Height: 70.5 in
Weight: 2878.33 oz

## 2020-11-02 LAB — LACTATE DEHYDROGENASE: LDH: 245 U/L — ABNORMAL HIGH (ref 98–192)

## 2020-11-02 LAB — MAGNESIUM: Magnesium: 2.4 mg/dL (ref 1.7–2.4)

## 2020-11-02 MED ORDER — WARFARIN SODIUM 5 MG PO TABS
5.0000 mg | ORAL_TABLET | Freq: Once | ORAL | Status: AC
  Administered 2020-11-02: 5 mg via ORAL
  Filled 2020-11-02: qty 1

## 2020-11-02 NOTE — Progress Notes (Addendum)
Patient ID: Marcus Johnson., male   DOB: October 19, 1960, 60 y.o.   MRN: [id number]     Advanced Heart Failure Rounding Note  PCP-Cardiologist: No primary care provider on file.   Subjective:    - 2/27 Milrinone switched to dobutamine 5 mcg.  - 2/28 Moved to ICU. Dobutamine increased to 7.5 mcg and Norepi 3 mcg added. Diuretics held.  - 3/1 Impella 5.5 placed.  - 3/4 Teeth removed - 3/10 HM3 LVAD placed - 3/13 VAD speed increased to 5500. [name] out  - 3/14 Nephrology consulted. Given 1UPRBCs  - 3/15 Started CRRT. Given 1UPRBCs.  - 3/23 VAD Speed turned down to 5300  - 3/23 CRRT and Milrinone Stopped. Back in Afib - 3/24 CRRT restarted.  - 3/27 CVVH off.  -3/28 iHD -3/29 iHD. Walked 75 feet!!  INR 1. 9 LDH 245  Complaining of buttock pain. Denies SOB.   Echo: EF 20-25%, normal RV, PASP 61, dilated IVC.   LVAD Interrogation HM 3: Speed: 5300 Flow: 4.8  PI: 3.2   Power: 4.  Multiple PI events  Objective:   Weight Range: 81.6 kg Body mass index is 25.45 kg/m.   Vital Signs:   Temp:  [98.1 F (36.7 C)-99.8 F (37.7 C)] 99.6 F (37.6 C) (03/30 0328) Pulse Rate:  [57-96] 82 (03/30 0700) Resp:  [9-26] 14 (03/30 0700) BP: (64-145)/(48-100) 72/61 (03/30 0700) SpO2:  [93 %-100 %] 100 % (03/30 0700) Weight:  [81.6 kg] 81.6 kg (03/30 0500) Last BM Date: 10/31/20  Weight change: Filed Weights   10/31/20 0500 11/01/20 0500 11/02/20 0500  Weight: 84.3 kg 82.1 kg 81.6 kg    Intake/Output:   Intake/Output Summary (Last 24 hours) at 11/02/2020 0741 Last data filed at 11/01/2020 2340 Gross per 24 hour  Intake 1200 ml  Output 3000 ml  Net -1800 ml      Physical Exam  Maps 67-70  CVP 13  Physical Exam: GENERAL: No acute distress. HEENT: normal  NECK: Supple, JVP  11-12 .  2+ bilaterally, no bruits.  No lymphadenopathy or thyromegaly appreciated.   CARDIAC:  Mechanical heart sounds with LVAD hum present. HD catheter. LUNGS:  Clear to auscultation bilaterally.   ABDOMEN:  Soft, round, nontender, positive bowel sounds x4.     LVAD exit site:  Dressing dry and intact.  No erythema or drainage.  Stabilization device present and accurately applied.  Driveline dressing is being changed daily per sterile technique. EXTREMITIES:  Warm and dry, no cyanosis, clubbing, rash or edema . RUE PICC  NEUROLOGIC:  Alert and oriented x 3.    No aphasia.  No dysarthria.  Affect pleasant.     Telemetry   NSR 80s personally reviewed.    Labs    CBC Recent Labs    11/01/20 0521 11/01/20 2003  WBC 11.0* 12.2*  HGB 8.4* 8.3*  HCT 27.1* 27.3*  MCV 90.3 90.4  PLT 279 182   Basic Metabolic Panel Recent Labs    11/01/20 0513 11/02/20 0517  NA 134* 132*  K 4.1 4.7  CL 98 95*  CO2 28 26  GLUCOSE 86 207*  BUN 35* 63*  CREATININE 3.11* 4.93*  CALCIUM 8.7* 9.0  MG 2.3 2.4  PHOS 2.0* 3.0   Liver Function Tests Recent Labs    11/01/20 0513 11/02/20 0517  ALBUMIN 2.3* 2.4*   No results for input(s): LIPASE, AMYLASE in the last 72 hours. Cardiac Enzymes No results for input(s): CKTOTAL, CKMB, CKMBINDEX, TROPONINI in the last  72 hours.  BNP: BNP (last 3 results) Recent Labs    10/14/20 0254 10/20/20 0305 10/27/20 0020  BNP 1,097.8* 466.7* 774.9*    ProBNP (last 3 results) No results for input(s): PROBNP in the last 8760 hours.   D-Dimer No results for input(s): DDIMER in the last 72 hours. Hemoglobin A1C No results for input(s): HGBA1C in the last 72 hours. Fasting Lipid Panel No results for input(s): CHOL, HDL, LDLCALC, TRIG, CHOLHDL, LDLDIRECT in the last 72 hours. Thyroid Function Tests No results for input(s): TSH, T4TOTAL, T3FREE, THYROIDAB in the last 72 hours.  Invalid input(s): FREET3  Other results:   Imaging    No results found.   Medications:     Scheduled Medications: . (feeding supplement) PROSource Plus  30 mL Oral BID BM  . amiodarone  200 mg Oral BID  . aspirin EC  81 mg Oral Daily  . Chlorhexidine  Gluconate Cloth  6 each Topical Daily  . Chlorhexidine Gluconate Cloth  6 each Topical Q0600  . collagenase   Topical Daily  . feeding supplement  237 mL Oral TID BM  . gabapentin  100 mg Oral QHS  . insulin aspart  0-20 Units Subcutaneous TID WC  . insulin aspart  0-5 Units Subcutaneous QHS  . insulin glargine  20 Units Subcutaneous BID  . midodrine  15 mg Oral TID WC  . multivitamin  1 tablet Oral QHS  . pantoprazole  40 mg Oral Daily  . polyethylene glycol  17 g Oral Daily  . sodium chloride flush  10-40 mL Intracatheter Q12H  . Warfarin - Pharmacist Dosing Inpatient   Does not apply q1600    Infusions: . sodium chloride    . sodium chloride    . lactated ringers    . lactated ringers Stopped (10/17/20 1655)    PRN Medications: sodium chloride, sodium chloride, acetaminophen, alteplase, dextrose, dextrose, fentaNYL (SUBLIMAZE) injection, heparin, heparin, lidocaine (PF), lidocaine-prilocaine, ondansetron (ZOFRAN) IV, oxyCODONE, pentafluoroprop-tetrafluoroeth, senna-docusate, sodium chloride flush, traZODone, white petrolatum    Assessment/Plan   1.Acute on chronic systolic HF -> cardiogenic shock:ICM.HasMedtronic ICD. EF 15%. Low output persisted despite milrinone and then dobutamine with rise in creatinine to 2.5. Impella 5.5 placed 3/1.  Creatinine improved to 1.7 and HM3 LVAD was placed on 3/10. Developed post-op renal failure requiring CVVH. Now off milrinone and epinephrine. Last echo showed moderate RV enlargement with mildly decreased RV function and VAD speed decreased to 5300 due to left-sided septum. CRRT stopped 3/23 but restarted 3/24. CVVH stopped again on 3/27. Tolerated iHD  - co-ox 56% today. Off pressors.  - Continue midodrine 15 mg tid.  - Off sildenafil.  2. LVAD: 3/13 VAD speed increased to 5500. 3/23 speed decreased to 5300. VAD interrogated personally. Parameters stable but still frequent PIs.  LDH stable at 244  - INR 1.9 today on warfarin, on ASA 81  daily. Drinking supplements. Discussed warfarin dose with pharmacy.  - DL site ok 3. CAD s/p CABG2002:Last cath in 6/19 with patent grafts.  - Continue Crestor 40 mg daily.  4. AKI on CKD Stage 3b: Suspect that this is a combination of cardiorenal syndrome and diabetic nephropathy and possibly ATN. Developed post-op AKI. Started on CRRT 3/15. CVVH stopped 3/23 but restarted 3/24.  - Tolerating iHD  5. Atrial flutter/fibrillation: s/p DC-CV 08/21/18. Maintaining NSR.     - Continue po amiodarone.  6. Wound R Foot: Partial thickness skin loss noted. Cover with mepilex border and change every 5 days.  7. Right arm pain: Think neuropathic, per Dr. Cyndia Bent patient had stretch of brachial plexus with Impella placement.  8. ID:  WBCs normal. Afebrile. Blood Cx- NGTD.  9. EO:FHQRFXJ of very poor control but hgbA1ctrending down. Most recent 7.7%.  Insulin drip stopped 3/24.  Now on lantus. Stop meal coverage.   - Diabetes coordinators.  10. Anemia: Stable hgb.   11. Deconditioning - PT/OT following, needs aggressive work.   - CIR consulted 12. Unstageable Pressure Ulcer on buttokcs.  - On hydrotherapy Mon-Sat.  - Continue reposition every 2 hours side lying position. Reposition frequently in the chair.   PT/OT appreciated. Ramp ECHO today.  Likely transfer to [facility].   Amy Clegg NP-C  11/02/2020 7:41 AM  Patient seen with NP, agree with the above note.   Had HD yesterday, tolerated well.  Weight down, CVP 13. Co-ox 56%.   General: Well appearing this am. NAD.  HEENT: Normal. Neck: Supple, JVP 10 cm. Carotids OK.  Cardiac:  Mechanical heart sounds with LVAD hum present.  Lungs:  CTAB, normal effort.  Abdomen:  NT, ND, no HSM. No bruits or masses. +BS  LVAD exit site: Well-healed and incorporated. Dressing dry and intact. No erythema or drainage. Stabilization device present and accurately applied. Driveline dressing changed daily per sterile technique. Extremities:  Warm and dry. No  cyanosis, clubbing, rash, or edema.  Neuro:  Alert & oriented x 3. Cranial nerves grossly intact. Moves all 4 extremities w/o difficulty. Affect pleasant    MAP is good when checked by doppler (runs lower by cuff).  Continue midodrine. HD next will be tomorrow. Co-ox 56%.   Echo today, will see if we need to adjust speed further.   Needs ongoing work with PT. Will need CIR.  Walked 75 feet yesterday.   Remains in NSR on amiodarone.   Loralie Champagne 11/02/2020 8:21 AM

## 2020-11-02 NOTE — Progress Notes (Signed)
CSW met with patient while ambulating the hallway. Patient was motivated to ambulate a few steps further than the original target. Patient was in good spirits, smiled during the walk and joked with CSW and staff. Patient states he is trying with his recovery plan but "I am really tired". CSW provided supportive intervention and encouragement. CSW will continue to follow throughout implant hospitalization. Raquel Sarna, [place], [place]

## 2020-11-02 NOTE — Plan of Care (Signed)
  Problem: Education: ?Goal: Knowledge of General Education information will improve ?Description: Including pain rating scale, medication(s)/side effects and non-pharmacologic comfort measures ?Outcome: Progressing ?  ?Problem: Health Behavior/Discharge Planning: ?Goal: Ability to manage health-related needs will improve ?Outcome: Progressing ?  ?Problem: Clinical Measurements: ?Goal: Ability to maintain clinical measurements within normal limits will improve ?Outcome: Progressing ?Goal: Will remain free from infection ?Outcome: Progressing ?Goal: Diagnostic test results will improve ?Outcome: Progressing ?Goal: Cardiovascular complication will be avoided ?Outcome: Progressing ?  ?Problem: Activity: ?Goal: Risk for activity intolerance will decrease ?Outcome: Progressing ?  ?Problem: Nutrition: ?Goal: Adequate nutrition will be maintained ?Outcome: Progressing ?  ?Problem: Coping: ?Goal: Level of anxiety will decrease ?Outcome: Progressing ?  ?Problem: Elimination: ?Goal: Will not experience complications related to bowel motility ?Outcome: Progressing ?  ?Problem: Pain Managment: ?Goal: General experience of comfort will improve ?Outcome: Progressing ?  ?Problem: Safety: ?Goal: Ability to remain free from injury will improve ?Outcome: Progressing ?  ?Problem: Skin Integrity: ?Goal: Risk for impaired skin integrity will decrease ?Outcome: Progressing ?  ?

## 2020-11-02 NOTE — Progress Notes (Signed)
[facility] NEPHROLOGY PROGRESS NOTE  Assessment/ Plan:  Acute kidney injury on chronic kidney disease stage III: With underlying chronic kidney disease likely from diabetes/hypertension and worsening renal function from hemodynamically mediated ATN and cardiorenal syndrome. CRRT 3/15-3/22 -Unfortunately, no signs of renal recovery at the moment  -CRRT restarted 3/24 - 3/27. Tolerated UF 1.5L with HD 3/28, 3L DUF 3/29 -Plan next HD 11/03/20 -Will need CLIP to outpt unit with LVAD, TDC and eval for perm access prior to d/c.  It's going to be a long road with his LVAD, deconditioning but he wishes to try.  -strict I/O  Hyponatremia -hypervolemic. Pain is also definitely a contributing factor. Pain ctrl per primary service. Na being managed with renal replacement therapy  Acute exacerbation of chronic systolic heart failure with cardiogenic shock: LVAD, placed 3/10. off epinephrine now.  Volume status/UF managed with RRT  Anemia:Likely secondary to chronic disease and exacerbated by recent surgical losses. Cont ESA qMon.   Transfuse PRBC as needed.  Recheck iron tomorrow.   Atrial fibrillation: per cardiology  Hypophosphatemia: repleting prn  Protein calorie malnutrition, hypoalbuminemia: push protein, per primary  Pressure ulcers per primary, see above for pain control  Jannifer Hick MD [facility] Pager [phone]  Subjective: no complaints from patient this AM.  Tolerated HD 3 UF yesterday (DUF) - it was late last night. CVP pending this AM.   Objective Vital signs in last 24 hours: Vitals:   11/02/20 0400 11/02/20 0500 11/02/20 0506 11/02/20 0600  BP: 94/72 (!) 64/52 (!) 87/76 92/60  Pulse: 82 (!) 57 85   Resp: (!) 24 16 14 14   Temp:      TempSrc:      SpO2: 99% 100% 99%   Weight:  81.6 kg    Height:       Weight change: -0.5 kg  Intake/Output Summary (Last 24 hours) at 11/02/2020 7035 Last data filed at 11/01/2020 2340 Gross per 24 hour   Intake 1200 ml  Output 3000 ml  Net -1800 ml       Labs: Basic Metabolic Panel: Recent Labs  Lab 10/31/20 1651 11/01/20 0513 11/02/20 0517  NA 133* 134* 132*  K 3.9 4.1 4.7  CL 97* 98 95*  CO2 28 28 26   GLUCOSE 82 86 207*  BUN 22* 35* 63*  CREATININE 2.33* 3.11* 4.93*  CALCIUM 8.6* 8.7* 9.0  PHOS 1.4* 2.0* 3.0   Liver Function Tests: Recent Labs  Lab 10/31/20 1651 11/01/20 0513 11/02/20 0517  ALBUMIN 2.4* 2.3* 2.4*   No results for input(s): LIPASE, AMYLASE in the last 168 hours. No results for input(s): AMMONIA in the last 168 hours. CBC: Recent Labs  Lab 10/29/20 0430 10/30/20 0404 10/31/20 0420 11/01/20 0521 11/01/20 2003  WBC 12.3* 9.8 11.3* 11.0* 12.2*  HGB 9.3* 8.9* 8.3* 8.4* 8.3*  HCT 30.9* 29.6* 27.2* 27.1* 27.3*  MCV 90.9 91.9 90.7 90.3 90.4  PLT 329 278 277 279 301   Cardiac Enzymes: No results for input(s): CKTOTAL, CKMB, CKMBINDEX, TROPONINI in the last 168 hours. CBG: Recent Labs  Lab 10/31/20 2142 11/01/20 0701 11/01/20 1124 11/01/20 1538 11/01/20 2147  GLUCAP 188* 101* 157* 166* 125*    Iron Studies: No results for input(s): IRON, TIBC, TRANSFERRIN, FERRITIN in the last 72 hours. Studies/Results: No results found.  Medications: Infusions: . sodium chloride    . sodium chloride    . lactated ringers    . lactated ringers Stopped (10/17/20 1655)    Scheduled Medications: Marland Kitchen (  feeding supplement) PROSource Plus  30 mL Oral BID BM  . amiodarone  200 mg Oral BID  . aspirin EC  81 mg Oral Daily  . Chlorhexidine Gluconate Cloth  6 each Topical Daily  . Chlorhexidine Gluconate Cloth  6 each Topical Q0600  . collagenase   Topical Daily  . feeding supplement  237 mL Oral TID BM  . gabapentin  100 mg Oral QHS  . insulin aspart  0-20 Units Subcutaneous TID WC  . insulin aspart  0-5 Units Subcutaneous QHS  . insulin glargine  20 Units Subcutaneous BID  . midodrine  15 mg Oral TID WC  . multivitamin  1 tablet Oral QHS  .  pantoprazole  40 mg Oral Daily  . polyethylene glycol  17 g Oral Daily  . sodium chloride flush  10-40 mL Intracatheter Q12H  . Warfarin - Pharmacist Dosing Inpatient   Does not apply q1600    have reviewed scheduled and prn medications.  Physical Exam: General: NAD, sitting up in bed eating full breakfast Heart: Mechanical heart sound/lvad hum Lungs: cta bl Abdomen:soft, Non-tender, non-distended Extremities: no edema bl Dialysis Access: Temporary RIJ HD catheter.  Ria Comment A Angelette Ganus 11/02/2020,6:52 AM  LOS: 35 days

## 2020-11-02 NOTE — Progress Notes (Signed)
Outpatient HD referral completed pending TDC note and updated HepB surface antigen lab result. Navigator to follow closely.  Alphonzo Cruise, [facility] Renal Navigator [phone]

## 2020-11-02 NOTE — Progress Notes (Signed)
ANTICOAGULATION CONSULT NOTE - Follow Up Consult  Pharmacy Consult for Warfarin Indication: LVAD  Allergies  Allergen Reactions  . Meclizine Hcl Anaphylaxis and Swelling  . Ivabradine Nausea Only    Patient Measurements: Height: 5' 10.5" (179.1 cm) Weight: 81.6 kg (179 lb 14.3 oz) IBW/kg (Calculated) : 74.15 Heparin Dosing Weight: 89.2 kg  Vital Signs: Temp: 98.2 F (36.8 C) (03/30 0810) Temp Source: Oral (03/30 0810) BP: 97/63 (03/30 0800) Pulse Rate: 81 (03/30 0800)  Labs: Recent Labs    10/31/20 0420 10/31/20 1651 11/01/20 0513 11/01/20 0521 11/01/20 2003 11/02/20 0517  HGB 8.3*  --   --  8.4* 8.3*  --   HCT 27.2*  --   --  27.1* 27.3*  --   PLT 277  --   --  279 301  --   LABPROT 20.7*  --  19.6*  --   --  21.3*  INR 1.9*  --  1.7*  --   --  1.9*  CREATININE 2.68* 2.33* 3.11*  --   --  4.93*    Estimated Creatinine Clearance: 16.9 mL/min (A) (by C-G formula based on SCr of 4.93 mg/dL (H)).   Medical History: Past Medical History:  Diagnosis Date  . AICD (automatic cardioverter/defibrillator) present    Single-chamber  implantable cardiac defibrillator - Medtronic  . Atrial fibrillation ([place])   . Cataract    Mixed form OU  . CHF (congestive heart failure) ([place])   . Chronic kidney disease   . Chronic kidney disease (CKD), stage III (moderate) (HCC)   . Chronic systolic heart failure ([place])    a. Echo 5/13: Mild LVE, mild LVH, EF 10%, anteroseptal, lateral, apical AK, mild MR, mild LAE, moderately reduced RVSF, mild RAE, PASP 60;  b. 07/2014 Echo: EF 20-2%, diff HK, AKI of antsep/apical/mid-apicalinferior, mod reduced RV.  Marland Kitchen Coronary artery disease    a. s/p CABG 2002 b. LHC 5/13:  dLM 80%, LAD subtotally occluded, pCFX occluded, pRCA 50%, mid? Occlusion with high take off of the PDA with 70% multiple lesions-not bypassed and supplies collaterals to LAD, LIMA-IM/ramus ok, S-OM ok, S-PLA branches ok. Medical therapy was recommended  . COVID   . Diabetic  retinopathy (HCC)    NPDR OD, PDR OS  . Dyspnea   . Gout    "on daily RX" (01/08/2018)  . Hypertension   . Hypertensive retinopathy    OU  . Ischemic cardiomyopathy    a. Echo 5/13: Mild LVE, mild LVH, EF 10%, anteroseptal, lateral, apical AK, mild MR, mild LAE, moderately reduced RVSF, mild RAE, PASP 60;  b. 01/2012 s/p MDT [id number] Protecta XT VR AICD;  c. 07/2014 Echo: EF 20-2%, diff HK, AKI of antsep/apical/mid-apicalinferior, mod reduced RV.  Marland Kitchen MRSA (methicillin resistant Staphylococcus aureus)    Status post right foot plantar deep infection with MRSA status post  I&D 10/2008  . Myocardial infarction [facility])    "was told I'd had several before heart OR in 2002" (01/08/2018)  . Noncompliance   . Peripheral neuropathy   . Retinopathy, diabetic, background ([place])   . Syncope   . Type II diabetes mellitus ([place])    requiring insulin   . Vitreous hemorrhage, left (HCC)    and proliferative diabetic retinopathy    Assessment: 60 yo M s/p new LVAD implant with HeartMate III on 3/10. Warfarin started per MD on 3/12, and pharmacy asked to take over warfarin dosing.   INR subtherapeutic but up to 1.9 from 1.7. Currently taking 2-3  Ensures daily, eating very well. Hgb 8.3. No overt bleeding. LDH stable 200s  Goal of Therapy:  INR 2-2.5 Monitor platelets by anticoagulation protocol: Yes   Plan:  Warfarin 5 mg PO tonight  Daily PT/INR, CBC, monitor s/sx bleeding  [name], PharmD, BCPS PGY2 Cardiology Pharmacy Resident Phone: [phone] 11/02/2020  9:22 AM  Please check [email or web address] for unit-specific pharmacy phone numbers.

## 2020-11-02 NOTE — Progress Notes (Signed)
  I spoke to Renal Navigator, Garth Bigness about iHD and VAD.   She will discuss with Dr Johnney Ou and follow along.  The VAD coordinators are aware that HD center may need additional education on VAD.    Latrena Benegas NP-C  3:17 PM

## 2020-11-02 NOTE — Progress Notes (Signed)
  Echocardiogram 2D Echocardiogram has been performed.  Matilde Bash 11/02/2020, 1:56 PM

## 2020-11-02 NOTE — Progress Notes (Signed)
Speed  Flow  PI  Power  LVIDD  AI  Aortic openings  MR  TR  Septum  RV   5300 4.7  3.7 3.8 3.3 none 0/5  none none Slight pull left Mildly dilated    5200 4.5 3.6 3.6 3.6 none 0/5 none none midline Mildly dilated                                                       Doppler MAP:    Ramp ECHO performed at bedside.  At completion of ramp study, patients primary and back up controller programmed:  Fixed speed: 5200 Low speed limit: [place] RN Redbird Fahey Coordinator  Office: [phone]  24/7 Pager: [phone]

## 2020-11-02 NOTE — Progress Notes (Signed)
Physical Therapy Wound Treatment Patient Details  Name: Marcus Johnson. MRN: [id number] Date of Birth: [birth year]  Today's Date: 11/02/2020 Time: 7408-1448 Time Calculation (min): 31 min  Subjective  Subjective Assessment Subjective: "I'm going to transfer to a different room today." Patient and Family Stated Goals: did not state Date of Onset:  (unknown) Prior Treatments: dressing change  Pain Score:  6/10  Wound Assessment  Pressure Injury 10/16/20 Coccyx Mid Stage 2 -  Partial thickness loss of dermis presenting as a shallow open injury with a red, pink wound bed without slough. Blister that has drained, 2 cm X 4.5 cm (Active)  Dressing Type ABD;Barrier Film (skin prep);Moist to dry;Santyl;Normal saline moist dressing 11/02/20 1250  Dressing Changed;Clean;Dry;Intact 11/02/20 1250  Dressing Change Frequency Daily 11/02/20 1250  State of Healing Early/partial granulation 11/02/20 1250  Site / Wound Assessment Yellow;Pink;Pale 11/02/20 1250  % Wound base Red or Granulating 80% 11/02/20 1250  % Wound base Yellow/Fibrinous Exudate 20% 11/02/20 1250  % Wound base Black/Eschar 0% 11/02/20 1250  % Wound base Other/Granulation Tissue (Comment) 0% 11/02/20 1250  Peri-wound Assessment Intact 11/02/20 1250  Wound Length (cm) 4 cm 10/29/20 1100  Wound Width (cm) 7 cm 10/29/20 1100  Wound Depth (cm) 0.1 cm 10/29/20 1100  Wound Surface Area (cm^2) 28 cm^2 10/29/20 1100  Wound Volume (cm^3) 2.8 cm^3 10/29/20 1100  Margins Unattached edges (unapproximated) 11/01/20 1300  Drainage Amount Scant 11/02/20 1250  Drainage Description Serous 11/02/20 1250  Treatment Debridement (Selective);Hydrotherapy (Pulse lavage);Packing (Saline gauze) 11/02/20 1250     Hydrotherapy Pulsed lavage therapy - wound location: coccyx Pulsed Lavage with Suction (psi): 8 psi Pulsed Lavage with Suction - Normal Saline Used: 1000 mL Pulsed Lavage Tip: Tip with splash shield Selective Debridement Selective  Debridement - Location: coccyx Selective Debridement - Tools Used: Forceps,Scalpel Selective Debridement - Tissue Removed: yellow slough    Wound Assessment and Plan  Wound Therapy - Assess/Plan/Recommendations Wound Therapy - Clinical Statement: Granulation tissue increasing with few areas of yellow slough remaining. Plan for one more treatment 3/31 and d/c to RN dressing changes. [place] present. Will benefit from selective debridement, pulsatile lavage and Santyl to cleanse wound and decrease amount of necrotic tissue. Wound Therapy - Functional Problem List: decreased mobility Factors Delaying/Impairing Wound Healing: Immobility,Multiple medical problems Hydrotherapy Plan: Debridement,Dressing change,Patient/family education,Pulsatile lavage with suction Wound Therapy - Frequency: 6X / week Wound Therapy - Follow Up Recommendations: dressing changes by RN  Wound Therapy Goals- Improve the function of patient's integumentary system by progressing the wound(s) through the phases of wound healing (inflammation - proliferation - remodeling) by: Wound Therapy Goals - Improve the function of patient's integumentary system by progressing the wound(s) through the phases of wound healing by: Decrease Necrotic Tissue to: 10 Decrease Necrotic Tissue - Progress: Goal set today Increase Granulation Tissue to: 90 Increase Granulation Tissue - Progress: Goal set today Goals/treatment plan/discharge plan were made with and agreed upon by patient/family: Yes Time For Goal Achievement: 7 days Wound Therapy - Potential for Goals: Good  Goals will be updated until maximal potential achieved or discharge criteria met.  Discharge criteria: when goals achieved, discharge from hospital, MD decision/surgical intervention, no progress towards goals, refusal/missing three consecutive treatments without notification or medical reason.  GP     Wyona Almas, PT, DPT Acute Rehabilitation Services Pager  [phone] Office [phone]   Charges PT Wound Care Charges $Wound Debridement up to 20 cm: < or equal to 20 cm $PT Hydrotherapy Dressing: 1  dressing $PT PLS Gun and Tip: 1 Supply $PT Hydrotherapy Visit: 1 Visit       Deno Etienne 11/02/2020, 12:54 PM

## 2020-11-02 NOTE — Progress Notes (Signed)
CARDIAC REHAB PHASE I   PRE:  Rate/Rhythm: 82   BP:  Sitting: 80/69 (75)      SaO2: 100 RA  MODE:  Ambulation: 50 ft +80 ft   POST:  Rate/Rhythm: 89  BP:  Sitting: 111/99 (105)    SaO2: 100 RA  After some encouragement, pt agreeable to ambulate. Pt sat on EOB, and c/o dizziness. No improvement in dizziness but pt agreeable to ambulate. Pt ambulated ~46ft assist of 2 with chair follow and took a seated rest break. Pt c/o dizziness and leg weakness. Pt than agreeable to walk "a little further", pt than ambulated an additional 52ft. Pt sat and rolled back to room. Pt agreeable to sit in recliner. Encouraged continued ambulation and IS use. Will continue to follow.  [id number] Rufina Falco, RN BSN 11/02/2020 3:14 PM

## 2020-11-02 NOTE — Progress Notes (Signed)
Inpatient Rehab Admissions Coordinator:   Met with patient at bedside.  He remembers my visit from yesterday, despite being nearly sound asleep.  We discussed goals and expectations of CIR, expecting a shorter length of stay (7-10 days) with mod I goals.  His brother has already agreed to provide 24/7 supervision, as required for his LVAD.  We discussed that insurance authorization would be required for CIR stay, and I will open this closer to him being medically ready (typically they respond within 24 hrs). Will continue to follow.   Shann Medal, PT, DPT Admissions Coordinator [phone] 11/02/20  12:54 PM

## 2020-11-02 NOTE — Telephone Encounter (Signed)
Resent [employer] and [employer] applications via fax.

## 2020-11-02 NOTE — Progress Notes (Signed)
Renal Navigator received call from A. Clegg/Heart Failure NP to discuss patient. She states plan for him to move out of 2H to [facility] today with possibility of transfer to CIR as early as middle of next week. Outpatient HD referrals for patients with LVADs can be a timely process because they need to be approved by [place] after clinic staff are updated on LVAD training. Navigator spoke with Dr. Johnney Ou who is agreeable for Navigator to initiate referral to patient's closest clinic to home at discharge from [facility].  Navigator will submit referral to [facility] Admissions to request outpatient HD seat for AKI treatment and follow closely. Navigator will follow up with patient.   Alphonzo Cruise, [place] Renal Navigator [phone]

## 2020-11-02 NOTE — Progress Notes (Signed)
LVAD Coordinator Rounding Note:  Admitted 09/28/20 due to worsening heart failure symptoms. Recent COVID infection January 2022; did not take medications for 3-4 weeks.   S/p dental extractions 10/07/20 per Dr Benson Norway.  HM III LVAD implanted on 10/13/20 by Dr Cyndia Bent under Destination Therapy criteria.  Pt lying in bed. Reports sacral pain slightly improved after hydrotherapy this morning. Plans to walk in hallway with his nurse this afternoon.   Reports continued weakness/numbness of his right hand. He has ball and putty at bedside, and a list of exercises that he is to complete throughout the day to increase dexterity in his hand. Will need extensive therapy in order to independently perform power source changes/handle VAD equipment. Encouraged patient and bedside RN to complete exercises several times a day to increase hand strength.   Pt is having hydrotherapy on his sacral wound wound 6 days a week Monday-Saturday. Continue turning from side to side to alleviate pressure on wound bed.    Intermittent HD started yesterday; tolerated well. Plan for HD again tomorrow.  RAMP echo completed. See separate not for documentation. Plan to transfer to [facility] when bed available.   VAD education as documented below. Pt's brother is unable to come for further education this week, and patient's son is out of town until this weekend. Will set up education time with son and brother starting next week.   Vital signs: Temp: 98 HR: 85 Doppler: 88 Auto cuff: 99/73 (82) O2 Sat: 99% on RA Wt: 200.1>221.7>219.5...Marland Kitchen..182.7>174.8>177.2>183.2>180>175>178>185.8>181>179.9 lbs    LVAD interrogation reveals:  Speed: 5300   (speed decreased to 5200 this afternoon) Flow: 4.8 Power: 3.8w PI: 3.1 Hematocrit: 31  Alarms: none Events: 1 PI event today  Fixed speed: 5300 Low speed limit: 5000  Drive Line: Existing VAD dressing removed and site care performed using sterile technique. Drive line exit site cleaned with  Chlora prep applicators x 2, allowed to dry, and gauze dressing with silver strip re-applied; skin tears healing. NO skin prep used due to skin intolerance of same. Exit site healing and unincorporated the velour is fully implanted at exit site. There is 1 suture visible that is wrapped around the driveline. Scant amount of brown drainage from fat necrosis. No redness, tenderness, foul odor or rash noted. Drive line anchor correctly applied. May advance to every other day dressing changes by VAD coordinator or nurse champion. Next dressing change due 11/04/20.      Labs:  LDH trend: 444>670>575>571.....370>361>327>310>306>281>259>246>244  INR trend: 1.4>1.7>1.8>1.7>1.6>1.8............2.0>2.2>2.1>1.8>1.9>1.7  Creatinine: 1.84>3.0>3.4>2.73.....1.76>1.68>2.55>3.53>2.54>2.6>2.2>1.78>2.68>3.11  Anticoagulation Plan: -INR Goal: 2.0-2.5 -ASA Dose: 81 mg  Blood Products:  IntraOp:   1 Platelet  6 FFP  3 PRBC  1035 cell saver Postop:   10/13/20:   4 FFP  4 PRBC    10/14/20:  1 FFP  1 PRBC   10/16/20:  1 PRBC   10/18/20:  1 PRBC   10/16/20:  1 PRBC   10/18/20:  1 PRBC   10/27/20:  1 PRBC  Device: -  Medtronic single - Therapies: off   Arrythmias: VT in OR requiring defibrillation   Renal: CRRT   - started 10/19/20   - stopped 10/26/20   - re-started 10/27/20  - stopped 10/30/20   iHD started 10/31/20   Infection: - 10/27/20 blood cultures - no growth x 5 days; final   Patient Education: 1. Practiced sterile glove don/doff with patient's brother [name]. He requires frequent verbal cues from VAD coordinator to maintain sterile technique. Sent him home with a few pairs  of sterile gloves to continue practicing.  2. Demonstrated and verbalized drive line dressing change for [name]. Encouraged him to continue practicing don/doff of sterile gloves in preparation for dressing change next week.  3. [name] performed power source change with verbal cues from VAD coordinator. Pt able  to verbalize steps of power source change, and how to remove battery from clip. Pt unable to manage his own equipment at this time due to right hand weakness. 4. Will plan time to continue VAD education next week when [name] and pt's son [name] are available to come to hospital. Unsure at this time when that will be.   Plan/Recommendations:  1. Call VAD coordinator for equipment or drive line issues 2. Every other day drive line dressing change per VAD coordinator or nurse champion. Next dressing change due 11/04/20.  Emerson Monte RN [place] Coordinator  Office: [phone]  24/7 Pager: [phone]

## 2020-11-02 NOTE — Consult Note (Signed)
[place] Nurse wound follow up Patient receiving care in [facility] 2H17.  Wound assessed with PT at the time of hydrotherapy session today. Wound type: Stage 3 PI Measurement: please see PT note for all wound characteristics and details. Wound bed: Very small areas of slough remain scattered in the wound bed. Otherwise, it is 100% clean and pink Drainage (amount, consistency, odor)  Periwound: Dressing procedure/placement/frequency: PT will likely continue current POC through Thursday of this week, then d/c hydrotherapy.  They will let the [place] know when this action is taken, and at that time orders for local wound care can be entered by the [facility] nurse. Val Riles, RN, MSN, CWOCN, CNS-BC, pager [phone]

## 2020-11-03 DIAGNOSIS — I5023 Acute on chronic systolic (congestive) heart failure: Secondary | ICD-10-CM | POA: Diagnosis not present

## 2020-11-03 DIAGNOSIS — Z95811 Presence of heart assist device: Secondary | ICD-10-CM | POA: Diagnosis not present

## 2020-11-03 LAB — RENAL FUNCTION PANEL
Albumin: 2.4 g/dL — ABNORMAL LOW (ref 3.5–5.0)
Anion gap: 12 (ref 5–15)
BUN: 87 mg/dL — ABNORMAL HIGH (ref 6–20)
CO2: 25 mmol/L (ref 22–32)
Calcium: 9.2 mg/dL (ref 8.9–10.3)
Chloride: 95 mmol/L — ABNORMAL LOW (ref 98–111)
Creatinine, Ser: 6.37 mg/dL — ABNORMAL HIGH (ref 0.61–1.24)
GFR, Estimated: 9 mL/min — ABNORMAL LOW (ref >60)
Glucose, Bld: 154 mg/dL — ABNORMAL HIGH (ref 70–99)
Phosphorus: 4.8 mg/dL — ABNORMAL HIGH (ref 2.5–4.6)
Potassium: 5.2 mmol/L — ABNORMAL HIGH (ref 3.5–5.1)
Sodium: 132 mmol/L — ABNORMAL LOW (ref 135–145)

## 2020-11-03 LAB — HEPATITIS B SURFACE ANTIGEN: Hepatitis B Surface Ag: NONREACTIVE

## 2020-11-03 LAB — COOXEMETRY PANEL
Carboxyhemoglobin: 2 % — ABNORMAL HIGH (ref 0.5–1.5)
Methemoglobin: 1 % (ref 0.0–1.5)
O2 Saturation: 50.1 %
Total hemoglobin: 8.7 g/dL — ABNORMAL LOW (ref 12.0–16.0)

## 2020-11-03 LAB — IRON AND TIBC
Iron: 40 ug/dL — ABNORMAL LOW (ref 45–182)
Saturation Ratios: 12 % — ABNORMAL LOW (ref 17.9–39.5)
TIBC: 344 ug/dL (ref 250–450)
UIBC: 304 ug/dL

## 2020-11-03 LAB — LACTATE DEHYDROGENASE: LDH: 245 U/L — ABNORMAL HIGH (ref 98–192)

## 2020-11-03 LAB — ZINC: Zinc: 61 ug/dL (ref 44–115)

## 2020-11-03 LAB — GLUCOSE, CAPILLARY
Glucose-Capillary: 153 mg/dL — ABNORMAL HIGH (ref 70–99)
Glucose-Capillary: 186 mg/dL — ABNORMAL HIGH (ref 70–99)
Glucose-Capillary: 135 mg/dL — ABNORMAL HIGH (ref 70–99)

## 2020-11-03 LAB — FERRITIN: Ferritin: 158 ng/mL (ref 24–336)

## 2020-11-03 LAB — MAGNESIUM: Magnesium: 2.5 mg/dL — ABNORMAL HIGH (ref 1.7–2.4)

## 2020-11-03 LAB — PROTIME-INR
INR: 2.4 — ABNORMAL HIGH (ref 0.8–1.2)
Prothrombin Time: 25 seconds — ABNORMAL HIGH (ref 11.4–15.2)

## 2020-11-03 LAB — BRAIN NATRIURETIC PEPTIDE: B Natriuretic Peptide: 486 pg/mL — ABNORMAL HIGH (ref 0.0–100.0)

## 2020-11-03 MED ORDER — ALBUMIN HUMAN 25 % IV SOLN
INTRAVENOUS | Status: AC
  Filled 2020-11-03: qty 100

## 2020-11-03 MED ORDER — WARFARIN SODIUM 4 MG PO TABS
4.0000 mg | ORAL_TABLET | Freq: Once | ORAL | Status: AC
  Administered 2020-11-03: 4 mg via ORAL
  Filled 2020-11-03: qty 1

## 2020-11-03 MED ORDER — ALBUMIN HUMAN 25 % IV SOLN
25.0000 g | Freq: Once | INTRAVENOUS | Status: AC
  Administered 2020-11-03: 25 g via INTRAVENOUS

## 2020-11-03 MED ORDER — SODIUM CHLORIDE 0.9 % IV SOLN
510.0000 mg | Freq: Once | INTRAVENOUS | Status: DC
  Filled 2020-11-03: qty 17

## 2020-11-03 MED ORDER — ALBUMIN HUMAN 25 % IV SOLN: 25.0000 g | Freq: Once | INTRAVENOUS | Status: AC

## 2020-11-03 MED ORDER — ALBUMIN HUMAN 25 % IV SOLN
INTRAVENOUS | Status: AC
  Administered 2020-11-03: 25 g via INTRAVENOUS
  Filled 2020-11-03: qty 100

## 2020-11-03 NOTE — Progress Notes (Signed)
Physical Therapy Treatment Patient Details Name: Marcus Johnson. MRN: [id number] DOB: [birth year] Today's Date: 11/03/2020    History of Present Illness 60 y.o. male admitted 2/28 with ICM for LVAD workup. Impella placed 3/1. Heartmate 3 LVAD placed 3/10. CRRT started 3/15. CRRT held 3/23-3/24 then restarted until 3/27 with iHD started 3/28. PMHx: DM2, CAD s/p CABG 5956, systolic HF due to ischemic cardiomyopathy with EF 20-25% (echo 12/15), DM2 and CKD. He is s/p Medtronic single chamber ICD. Covid 1/22.    PT Comments    Pt pleasant and reports constant sacral pain and being uncomfortable at all times but not wanting to take pain medicine and unable to tolerate sitting in chair even with gel cushion. Pt continues to progress gait and activity with noted 3 seconds of rotational nystagmus with initial transition from left side to sitting and return to sidely. Pt unfortunately not currently able to tolerate Marye Round assessment due to sacral pain but will continue to further assess vestibular dysfunction. Pt celebrated progression in gait and reports brother will be at home at all times to assist with power transitions. Remains mod assist for power transitions due to RUE weakness and limited recall of specific sequence of line management.   Speed 5250, flow 4.1, PI 4.8 at rest with drop to 3.4 with gait, power 3.8 HR 90-94    Follow Up Recommendations  Supervision/Assistance - 24 hour;CIR     Equipment Recommendations  Other (comment);3in1 (PT)    Recommendations for Other Services       Precautions / Restrictions Precautions Precautions: Fall;Sternal;Other (comment) Precaution Comments: LVAD, sacral wound; R IJ dialysis catheter, ? vestibular dysfunction    Mobility  Bed Mobility Overal bed mobility: Needs Assistance Bed Mobility: Rolling;Sidelying to Sit;Sit to Sidelying Rolling: Min guard Sidelying to sit: Mod assist     Sit to sidelying: Min assist General bed  mobility comments: mod assist to clear legs from bed and elevate trunk into sitting. Assist to lift legs to surface for return to bed    Transfers Overall transfer level: Needs assistance   Transfers: Sit to/from Stand Sit to Stand: Min assist         General transfer comment: cues for hands on thighs with pt able to stand 4x from bed and recliner  Ambulation/Gait Ambulation/Gait assistance: Min assist;+2 safety/equipment Gait Distance (Feet): 110 Feet Assistive device: Rolling walker (2 wheeled) Gait Pattern/deviations: Step-through pattern;Decreased stride length   Gait velocity interpretation: <1.8 ft/sec, indicate of risk for recurrent falls General Gait Details: cues for posture and position in [place]. Pt walked 110,' 40,' 40' respectively over 3 trials with close chair follow. PI 3.4 with walking   Stairs             Wheelchair Mobility    Modified Rankin (Stroke Patients Only)       Balance Overall balance assessment: Needs assistance Sitting-balance support: No upper extremity supported;Feet supported Sitting balance-Leahy Scale: Fair Sitting balance - Comments: EOB without assist   Standing balance support: Bilateral upper extremity supported Standing balance-Leahy Scale: Poor Standing balance comment: UE support for stepping and standing                            Cognition Arousal/Alertness: Awake/alert Behavior During Therapy: WFL for tasks assessed/performed Overall Cognitive Status: Impaired/Different from baseline Area of Impairment: Problem solving;Memory  Memory: Decreased short-term memory Following Commands: Follows one step commands consistently     Problem Solving: Slow processing General Comments: cognition much improved with pt able to recall all sternal precautions but continues to need step by step cues and assist for power transition      Exercises General Exercises - Lower Extremity Short  Arc Quad: AROM;Both;Supine;20 reps Hip Flexion/Marching: Both;Seated;20 reps;AROM    General Comments        Pertinent Vitals/Pain Pain Score: 7  Pain Location: sacrum Pain Descriptors / Indicators: Discomfort;Grimacing;Aching Pain Intervention(s): Limited activity within patient's tolerance;Monitored during session;Repositioned;Premedicated before session    Home Living                      Prior Function            PT Goals (current goals can now be found in the care plan section) Progress towards PT goals: Progressing toward goals    Frequency    Min 4X/week      PT Plan Current plan remains appropriate    Co-evaluation              AM-PAC PT "6 Clicks" Mobility   Outcome Measure  Help needed turning from your back to your side while in a flat bed without using bedrails?: A Little Help needed moving from lying on your back to sitting on the side of a flat bed without using bedrails?: A Lot Help needed moving to and from a bed to a chair (including a wheelchair)?: A Little Help needed standing up from a chair using your arms (e.g., wheelchair or bedside chair)?: A Little Help needed to walk in hospital room?: A Lot Help needed climbing 3-5 steps with a railing? : Total 6 Click Score: 14    End of Session Equipment Utilized During Treatment: Gait belt Activity Tolerance: Patient tolerated treatment well Patient left: with call bell/phone within reach;in bed (returned to bed due to sacral pain) Nurse Communication: Mobility status PT Visit Diagnosis: Other abnormalities of gait and mobility (R26.89);Muscle weakness (generalized) (M62.81);Difficulty in walking, not elsewhere classified (R26.2);Pain     Time: 1000-1043 PT Time Calculation (min) (ACUTE ONLY): 43 min  Charges:  $Gait Training: 8-22 mins $Therapeutic Exercise: 8-22 mins $Therapeutic Activity: 8-22 mins                     Yerick Eggebrecht P, PT Acute Rehabilitation Services Pager:  [phone] Office: [place] 11/03/2020, 1:22 PM

## 2020-11-03 NOTE — Plan of Care (Signed)
  Problem: Education: Goal: Knowledge of General Education information will improve Description: Including pain rating scale, medication(s)/side effects and non-pharmacologic comfort measures Outcome: Progressing   Problem: Health Behavior/Discharge Planning: Goal: Ability to manage health-related needs will improve Outcome: Progressing   Problem: Clinical Measurements: Goal: Ability to maintain clinical measurements within normal limits will improve Outcome: Progressing Goal: Will remain free from infection Outcome: Progressing Goal: Diagnostic test results will improve Outcome: Progressing Goal: Cardiovascular complication will be avoided Outcome: Progressing   Problem: Nutrition: Goal: Adequate nutrition will be maintained Outcome: Progressing   Problem: Coping: Goal: Level of anxiety will decrease Outcome: Progressing

## 2020-11-03 NOTE — Progress Notes (Signed)
PT Cancellation Note  Patient Details Name: Marcus Johnson. MRN: [id number] DOB: 1961/08/06   Cancelled Treatment:    Reason Eval/Treat Not Completed: Patient at procedure or test/unavailable.  Pt was unavailable for the whole morning, then went to HD for the entire afternoon.  Unable to see pt today, RN was notified. 11/03/2020  Ginger Carne., PT Acute Rehabilitation Services [phone]  (pager) [phone]  (office)   Tessie Fass Vitoria Conyer 11/03/2020, 4:31 PM

## 2020-11-03 NOTE — Progress Notes (Signed)
LVAD Coordinator Rounding Note:  Admitted 09/28/20 due to worsening heart failure symptoms. Recent COVID infection January 2022; did not take medications for 3-4 weeks.   S/p dental extractions 10/07/20 per Dr Benson Norway.  HM III LVAD implanted on 10/13/20 by Dr Cyndia Bent under Destination Therapy criteria.  Pt lying in bed. Continues to c/o sacral pain slightly pt states he is worried about the hydrotherapy today as it is very painful. Pt tells me that he was able to walk 130 ft yesterday. Pt is having hydrotherapy on his sacral wound wound 6 days a week Monday-Saturday. Continue turning from side to side to alleviate pressure on wound bed.  Reports continued weakness/numbness of his right hand. He has ball and putty at bedside, and a list of exercises that he is to complete throughout the day to increase dexterity in his hand. Will need extensive therapy in order to independently perform power source changes/handle VAD equipment. Encouraged patient and bedside RN to complete exercises several times a day to increase hand strength.   Intermittent HD started; tolerated well. Plan for HD again today.  RAMP echo completed yesterday. Speed dropped to 5200-order modified to reflect this change and low speed corrected to 4900 today.  VAD education as documented below. Pt's brother is unable to come for further education this week, and patient's son is out of town until this weekend. Will set up education time with son and brother starting next week.   Vital signs: Temp: 98.4 HR: 85 Doppler: 84 Auto cuff: 106/87 (95) O2 Sat: 99% on RA Wt: 200.1>221.7>219.5...Marland Kitchen..182.7>174.8>177.2>183.2>180>175>178>185.8>181>179.9>190.2 lbs    LVAD interrogation reveals:  Speed: 5200 Flow: 4.4 Power: 3.7w PI: 4.0 Hematocrit: 31  Alarms: none Events: none  Fixed speed: 5200 Low speed limit: 4900  Drive Line: CDI. Drive line anchor correctly applied. May advance to every other day dressing changes by VAD  coordinator or nurse champion. Next dressing change due 11/04/20.     Labs:  LDH trend: 444>670>575>571.....370>361>327>310>306>281>259>246>244>245  INR trend: 1.4>1.7>1.8>1.7>1.6>1.8............2.0>2.2>2.1>1.8>1.9>1.7>2.4  Creatinine: 1.84>3.0>3.4>2.73.....1.76>1.68>2.55>3.53>2.54>2.6>2.2>1.78>2.68>3.11>6.37  Anticoagulation Plan: -INR Goal: 2.0-2.5 -ASA Dose: 81 mg  Blood Products:  IntraOp:   1 Platelet  6 FFP  3 PRBC  1035 cell saver Postop:   10/13/20:   4 FFP  4 PRBC    10/14/20:  1 FFP  1 PRBC   10/16/20:  1 PRBC   10/18/20:  1 PRBC   10/16/20:  1 PRBC   10/18/20:  1 PRBC   10/27/20:  1 PRBC  Device: -  Medtronic single - Therapies: off   Arrythmias: VT in OR requiring defibrillation   Renal: CRRT   - started 10/19/20   - stopped 10/26/20   - re-started 10/27/20  - stopped 10/30/20   iHD started 10/31/20   Infection: - 10/27/20 blood cultures - no growth x 5 days; final   Patient Education: 1. Practiced sterile glove don/doff with patient's brother [name]. He requires frequent verbal cues from VAD coordinator to maintain sterile technique. Sent him home with a few pairs of sterile gloves to continue practicing.  2. Demonstrated and verbalized drive line dressing change for [name]. Encouraged him to continue practicing don/doff of sterile gloves in preparation for dressing change next week.  3. [name] performed power source change with verbal cues from VAD coordinator. Pt able to verbalize steps of power source change, and how to remove battery from clip. Pt unable to manage his own equipment at this time due to right hand weakness. 4. Will plan time to continue VAD education  next week when [name] and pt's son [name] are available to come to hospital. Unsure at this time when that will be.   Plan/Recommendations:  1. Call VAD coordinator for equipment or drive line issues 2. Every other day drive line dressing change per VAD coordinator or nurse  champion. Next dressing change due 11/04/20.  Tanda Rockers RN [place] Coordinator  Office: [phone]  24/7 Pager: [phone]

## 2020-11-03 NOTE — Progress Notes (Addendum)
Patient ID: Marcus Johnson., male   DOB: [birth year], 60 y.o.   MRN: [id number]     Advanced Heart Failure Rounding Note  PCP-Cardiologist: No primary care provider on file.   Subjective:    - 2/27 Milrinone switched to dobutamine 5 mcg.  - 2/28 Moved to ICU. Dobutamine increased to 7.5 mcg and Norepi 3 mcg added. Diuretics held.  - 3/1 Impella 5.5 placed.  - 3/4 Teeth removed - 3/10 HM3 LVAD placed - 3/13 VAD speed increased to 5500. [name] out  - 3/14 Nephrology consulted. Given 1UPRBCs  - 3/15 Started CRRT. Given 1UPRBCs.  - 3/23 VAD Speed turned down to 5300  - 3/23 CRRT and Milrinone Stopped. Back in Afib - 3/24 CRRT restarted.  - 3/27 CVVH off.  -3/28 iHD -3/29 iHD. Walked 75 feet!! -3/20 Ramp Echo: Fixed speed: 5200 Low speed limit: 4900  Remains anuric. On iHD. K 5.2 today  CVP 12. Co--ox marginal at 50%  INR 2.4   Feels ok today. Has dry cough. No dyspnea.   Echo: EF 20-25%, normal RV, PASP 61, dilated IVC.   LVAD Interrogation HM 3: Speed: 5200 Flow: 4.4  PI: 3.8   Power: 3.6.  No  PI events  Objective:   Weight Range: 86.3 kg Body mass index is 26.91 kg/m.   Vital Signs:   Temp:  [97.6 F (36.4 C)-98.7 F (37.1 C)] 98.4 F (36.9 C) (03/31 0751) Pulse Rate:  [64-87] 83 (03/31 0454) Resp:  [10-24] 12 (03/31 0454) BP: (79-116)/(39-99) 106/87 (03/31 0800) SpO2:  [96 %-100 %] 99 % (03/31 0454) Weight:  [86.3 kg] 86.3 kg (03/31 0454) Last BM Date: 10/31/20  Weight change: Filed Weights   11/01/20 0500 11/02/20 0500 11/03/20 0454  Weight: 82.1 kg 81.6 kg 86.3 kg    Intake/Output:  No intake or output data in the 24 hours ending 11/03/20 0818    Physical Exam   MAPs 80s   CVP 12  GENERAL: laying in bed. No acute distress. HEENT: normal  NECK: Supple, JVP  12 cm .  2+ bilaterally, no bruits.  No lymphadenopathy or thyromegaly appreciated.   CARDIAC:  Mechanical heart sounds with LVAD hum present. HD catheter. LUNGS:  Clear to  auscultation bilaterally. No wheezing ABDOMEN:  Soft, round, nontender, positive bowel sounds x4.     LVAD exit site:  Dressing dry and intact.  No erythema or drainage.  Stabilization device present and accurately applied.  Driveline dressing is being changed daily per sterile technique. EXTREMITIES:  Warm and dry, no cyanosis, clubbing, rash or edema . RUE PICC  NEUROLOGIC:  Alert and oriented x 3.    No aphasia.  No dysarthria.  Flat affect but  pleasant.     Telemetry   NSR 80s personally reviewed.    Labs    CBC Recent Labs    11/01/20 0521 11/01/20 2003  WBC 11.0* 12.2*  HGB 8.4* 8.3*  HCT 27.1* 27.3*  MCV 90.3 90.4  PLT 279 409   Basic Metabolic Panel Recent Labs    11/02/20 0517 11/03/20 0540  NA 132* 132*  K 4.7 5.2*  CL 95* 95*  CO2 26 25  GLUCOSE 207* 154*  BUN 63* 87*  CREATININE 4.93* 6.37*  CALCIUM 9.0 9.2  MG 2.4 2.5*  PHOS 3.0 4.8*   Liver Function Tests Recent Labs    11/02/20 0517 11/03/20 0540  ALBUMIN 2.4* 2.4*   No results for input(s): LIPASE, AMYLASE in the last 72 hours.  Cardiac Enzymes No results for input(s): CKTOTAL, CKMB, CKMBINDEX, TROPONINI in the last 72 hours.  BNP: BNP (last 3 results) Recent Labs    10/20/20 0305 10/27/20 0020 11/02/20 2310  BNP 466.7* 774.9* 486.0*    ProBNP (last 3 results) No results for input(s): PROBNP in the last 8760 hours.   D-Dimer No results for input(s): DDIMER in the last 72 hours. Hemoglobin A1C No results for input(s): HGBA1C in the last 72 hours. Fasting Lipid Panel No results for input(s): CHOL, HDL, LDLCALC, TRIG, CHOLHDL, LDLDIRECT in the last 72 hours. Thyroid Function Tests No results for input(s): TSH, T4TOTAL, T3FREE, THYROIDAB in the last 72 hours.  Invalid input(s): FREET3  Other results:   Imaging    ECHOCARDIOGRAM LIMITED  Result Date: 11/02/2020    ECHOCARDIOGRAM REPORT   Patient Name:   Marcus Johnson. Date of Exam: 11/02/2020 Medical Rec #:   [id number]                Height:       70.5 in Accession #:    [id number]               Weight:       179.9 lb Date of Birth:  04-May-1961                BSA:          2.006 m Patient Age:    62 years                 BP:           00/00 mmHg Patient Gender: M                        HR:           84 bpm. Exam Location:  Inpatient Procedure: Limited Echo and Color Doppler Indications:    LVAD evaluation  History:        Patient has prior history of Echocardiogram examinations, most                 recent 10/26/2020. CHF and Cardiomyopathy, CAD, Prior CABG,                 Arrythmias:Atrial Fibrillation and Atrial Flutter; Risk                 Factors:Diabetes. ESRD.  Sonographer:    Dustin Flock Referring Phys: [place] Comments: RAMP study IMPRESSIONS  1. RV size seemed to decrease at LVAD RPM 5200. Left ventricular ejection fraction, by estimation, is 20 to 25%. The left ventricle has severely decreased function. The left ventricle demonstrates global hypokinesis. The left ventricular internal cavity  size was mildly dilated. There is moderate left ventricular hypertrophy. Left ventricular diastolic function could not be evaluated.  2. Right ventricular systolic function is mildly reduced. The right ventricular size is severely enlarged.  3. The mitral valve was not well visualized. Trivial mitral valve regurgitation.  4. The aortic valve was not well visualized. Aortic valve regurgitation is mild. Mild aortic valve sclerosis is present, with no evidence of aortic valve stenosis.  5. Pulmonic valve regurgitation not assessed. FINDINGS  Left Ventricle: RV size seemed to decrease at LVAD RPM 5200. Left ventricular ejection fraction, by estimation, is 20 to 25%. The left ventricle has severely decreased function. The left ventricle demonstrates global hypokinesis. The left ventricular internal cavity size was mildly dilated. There  is moderate left ventricular hypertrophy. Left ventricular  diastolic function could not be evaluated. Right Ventricle: The right ventricular size is severely enlarged. Right vetricular wall thickness was not assessed. Right ventricular systolic function is mildly reduced. Left Atrium: Left atrial size was not assessed. Right Atrium: Right atrial size was not assessed. Pericardium: There is no evidence of pericardial effusion. Mitral Valve: The mitral valve was not well visualized. Trivial mitral valve regurgitation. Tricuspid Valve: The tricuspid valve is not well visualized. Tricuspid valve regurgitation is mild. Aortic Valve: The aortic valve was not well visualized. Aortic valve regurgitation is mild. Mild aortic valve sclerosis is present, with no evidence of aortic valve stenosis. Pulmonic Valve: The pulmonic valve was not assessed. Pulmonic valve regurgitation not assessed. Aorta: The aortic root was not well visualized. IAS/Shunts: The interatrial septum was not assessed. Additional Comments: A device lead is visualized.  LEFT VENTRICLE PLAX 2D LVIDd:         3.45 cm  Jenkins Rouge MD Electronically signed by Jenkins Rouge MD Signature Date/Time: 11/02/2020/2:13:00 PM    Final      Medications:     Scheduled Medications: . (feeding supplement) PROSource Plus  30 mL Oral BID BM  . amiodarone  200 mg Oral BID  . aspirin EC  81 mg Oral Daily  . Chlorhexidine Gluconate Cloth  6 each Topical Daily  . Chlorhexidine Gluconate Cloth  6 each Topical Q0600  . collagenase   Topical Daily  . feeding supplement  237 mL Oral TID BM  . gabapentin  100 mg Oral QHS  . insulin aspart  0-20 Units Subcutaneous TID WC  . insulin aspart  0-5 Units Subcutaneous QHS  . insulin glargine  20 Units Subcutaneous BID  . midodrine  15 mg Oral TID WC  . multivitamin  1 tablet Oral QHS  . pantoprazole  40 mg Oral Daily  . polyethylene glycol  17 g Oral Daily  . sodium chloride flush  10-40 mL Intracatheter Q12H  . Warfarin - Pharmacist Dosing Inpatient   Does not apply q1600     Infusions: . sodium chloride    . sodium chloride    . lactated ringers    . lactated ringers Stopped (10/17/20 1655)    PRN Medications: sodium chloride, sodium chloride, acetaminophen, alteplase, dextrose, fentaNYL (SUBLIMAZE) injection, heparin, heparin, lidocaine (PF), lidocaine-prilocaine, ondansetron (ZOFRAN) IV, oxyCODONE, pentafluoroprop-tetrafluoroeth, senna-docusate, sodium chloride flush, traZODone, white petrolatum    Assessment/Plan   1.Acute on chronic systolic HF -> cardiogenic shock:ICM.HasMedtronic ICD. EF 15%. Low output persisted despite milrinone and then dobutamine with rise in creatinine to 2.5. Impella 5.5 placed 3/1.  Creatinine improved to 1.7 and HM3 LVAD was placed on 3/10. Developed post-op renal failure requiring CVVH. Now off milrinone and epinephrine. Last echo showed moderate RV enlargement with mildly decreased RV function and VAD speed decreased to 5300 due to left-sided septum. CRRT stopped 3/23 but restarted 3/24. CVVH stopped again on 3/27. Ramp echo 3/30:  Fixed speed: 5200 Low speed limit: 4900 Tolerated iHD  - co-ox 50% today. Off pressors.  - Continue midodrine 15 mg tid.  - Off sildenafil.  2. LVAD: 3/13 VAD speed increased to 5500. 3/23 speed decreased to 5300. VAD interrogated personally. Parameters stable but still frequent PIs.  LDH stable at 245  - INR 2.4 today on warfarin, on ASA 81 daily. Drinking supplements. Discussed warfarin dose with pharmacy.  - DL site ok 3. CAD s/p CABG2002:Last cath in 6/19 with patent grafts.  - Continue  Crestor 40 mg daily.  4. AKI on CKD Stage 3b: Suspect that this is a combination of cardiorenal syndrome and diabetic nephropathy and possibly ATN. Developed post-op AKI. Started on CRRT 3/15. CVVH stopped 3/23 but restarted 3/24.  - Tolerating iHD  5. Atrial flutter/fibrillation: s/p DC-CV 08/21/18. Maintaining NSR.     - Continue po amiodarone.  6. Wound R Foot: Partial thickness skin loss noted.  Cover with mepilex border and change every 5 days.  7. Right arm pain: Think neuropathic, per Dr. Cyndia Bent patient had stretch of brachial plexus with Impella placement.  8. ID:  WBCs normal. Afebrile. Blood Cx- NGTD.  9. LY:YTKPTWS of very poor control but hgbA1ctrending down. Most recent 7.7%.  Insulin drip stopped 3/24.  Now on lantus. Stop meal coverage.   - Diabetes coordinators.  10. Anemia: Stable hgb.   11. Deconditioning - PT/OT following, needs aggressive work.   - CIR consulted 12. Unstageable Pressure Ulcer on buttokcs.  - On hydrotherapy Mon-Sat.  - Continue reposition every 2 hours side lying position. Reposition frequently in the chair. 13. Hyperkalemia - K 5.2>>plan HD today  - PRN Lokelma on non HD days    Lyda Jester PA-C  11/03/2020 8:18 AM  Patient seen with NP, agree with the above note.   CVP 12 today.  To get HD later on.  Walked about 150 feet yesterday.  Slowly getting stronger.    Ramp echo again yesterday, speed decreased to 5200.  Flow stable.   General: Well appearing this am. NAD.  HEENT: Normal. Neck: Supple, JVP 12 cm. Carotids OK.  Cardiac:  Mechanical heart sounds with LVAD hum present.  Lungs:  CTAB, normal effort.  Abdomen:  NT, ND, no HSM. No bruits or masses. +BS  LVAD exit site: Well-healed and incorporated. Dressing dry and intact. No erythema or drainage. Stabilization device present and accurately applied. Driveline dressing changed daily per sterile technique. Extremities:  Warm and dry. No cyanosis, clubbing, rash, or edema.  Neuro:  Alert & oriented x 3. Cranial nerves grossly intact. Moves all 4 extremities w/o difficulty. Affect pleasant   Skin: Decubitus ulcer  Continue midodrine.  HD today for volume removal.     Needs ongoing work with PT. Will need CIR.  Walked farther yesterday.    Remains in NSR on amiodarone. INR therapeutic at 2.4.   Low transferrin saturation, will give IV Fe.   Loralie Champagne 11/03/2020 10:58 AM

## 2020-11-03 NOTE — Progress Notes (Signed)
CARDIAC REHAB PHASE I   Offered to walk with pt after lunch. Pt states ambulation this morning with PT, and is currently waiting on wound therapy and HD. Pt requested to try later. Upon return, pt out of room for HD. Will continue to follow and encourage ambulation as able.  Rufina Falco, RN BSN 11/03/2020 3:04 PM

## 2020-11-03 NOTE — Progress Notes (Signed)
Occupational Therapy Treatment Patient Details Name: Marcus Johnson. MRN: [id number] DOB: June 01, 1961 Today's Date: 11/03/2020    History of present illness 60 y.o. male admitted 2/28 with ICM for LVAD workup. Impella placed 3/1. Heartmate 3 LVAD placed 3/10. CRRT started 3/15. CRRT held 3/23-3/24 then restarted until 3/27 with iHD started 3/28. PMHx: DM2, CAD s/p CABG 0932, systolic HF due to ischemic cardiomyopathy with EF 20-25% (echo 12/15), DM2 and CKD. He is s/p Medtronic single chamber ICD. Covid 1/22.   OT comments  Pt tearful at times during session. Pt states he did not sleep well and just wants "the butt therapy to be over" - referring to wound care. Focus of session on RUE strengthening in L sidelying due significant sacral pain. Pt continues to demonstrate weakness in RUE, developing limited elbow extension and tightness in composite wrist flexion due to abnormal movement pattern. Increased tightness R shoulder capsule, limiting ER. Nsg educated on donning thumb strap and finger buddy strap to increase functional position of R hand. Pt encouraged to complete activities independently. Will continue to follow acutely.   Follow Up Recommendations  CIR;Supervision/Assistance - 24 hour    Equipment Recommendations  Other (comment) (TBA)    Recommendations for Other Services Rehab consult    Precautions / Restrictions Precautions Precautions: Fall;Sternal;Other (comment) Precaution Comments: LVAD, sacral wound; R IJ dialysis catheter, ? vestibular dysfunction          Vision       Perception     Praxis      Cognition Arousal/Alertness: Awake/alert Behavior During Therapy: WFL for tasks assessed/performed Overall Cognitive Status: Impaired/Different from baseline Area of Impairment: Attention;Awareness;Problem solving                   Current Attention Level: Selective Memory: Decreased short-term memory Following Commands: Follows one step commands  consistently   Awareness: Emergent Problem Solving: Slow processing General Comments: cognition much improved with pt able to recall all sternal precautions but continues to need step by step cues and assist for power transition        Exercises Exercises: Other exercises General Exercises - Upper Extremity Shoulder Flexion: Both;10 reps;Supine Elbow Extension: Right;10 reps (progressive stretch into extension) Wrist Flexion: AROM;AAROM;PROM;Right;10 reps;Sidelying Digit Composite Flexion: Right;AAROM;AROM;PROM;10 reps;Sidelying Other Exercises Other Exercises: pinch/grip strengthening x 10 Other Exercises: R scapular strengthening in PNF patterns in L sidelying; increased strength in elevation; pt demosntratign improved strength in depression Other Exercises: protraction/retraction in L sidelying Other Exercises: contract/relax to address increasing ER   Shoulder Instructions       General Comments Pt more upset and tearful today. "I just need to cry, is that OK?" Pt states "I know I just need to take it day by day" (Nsg educated on use of thumb strap and finger buddy strap to increase functional position R hand)    Pertinent Vitals/ Pain       Pain Assessment: Faces Pain Score: 7  Faces Pain Scale: Hurts whole lot Pain Location: sacrum Pain Descriptors / Indicators: Constant;Discomfort;Grimacing;Guarding;Crying Pain Intervention(s): Limited activity within patient's tolerance;Patient requesting pain meds-RN notified  Home Living                                          Prior Functioning/Environment              Frequency  Min 2X/week  Progress Toward Goals  OT Goals(current goals can now be found in the care plan section)  Progress towards OT goals: Progressing toward goals  Acute Rehab OT Goals Patient Stated Goal: to go home and be with his grandchildren OT Goal Formulation: With patient Time For Goal Achievement:  11/14/20 Potential to Achieve Goals: Good ADL Goals Pt Will Perform Eating: with set-up;with adaptive utensils Pt Will Perform Grooming: with set-up;sitting Pt Will Transfer to Toilet: with supervision;bedside commode;ambulating Pt/caregiver will Perform Home Exercise Program: Increased ROM;Increased strength;Both right and left upper extremity;With minimal assist;With written HEP provided Additional ADL Goal #1: Pt will be able to recall sternal precautions Additional ADL Goal #2: Pt will be able to change out his lines from battery<>wall with min A Additional ADL Goal #3: Pt will demonstrate functional pinch pattern to pick up 10 objects with no drops using thumb loop and buddy strap R hand  Plan Discharge plan remains appropriate    Co-evaluation                 AM-PAC OT "6 Clicks" Daily Activity     Outcome Measure   Help from another person eating meals?: A Little Help from another person taking care of personal grooming?: A Little Help from another person toileting, which includes using toliet, bedpan, or urinal?: A Lot Help from another person bathing (including washing, rinsing, drying)?: A Lot Help from another person to put on and taking off regular upper body clothing?: A Lot Help from another person to put on and taking off regular lower body clothing?: A Lot 6 Click Score: 14    End of Session    OT Visit Diagnosis: Unsteadiness on feet (R26.81);Other abnormalities of gait and mobility (R26.89);Muscle weakness (generalized) (M62.81);Other symptoms and signs involving cognitive function;Dizziness and giddiness (R42);Pain Pain - part of body:  (butt/sacrum)   Activity Tolerance Patient limited by pain   Patient Left in bed;with call bell/phone within reach   Nurse Communication Mobility status;Patient requests pain meds;Other (comment) (use of R hand strap to increase functional movement pattern)        Time: 0930-1001 OT Time Calculation (min): 31  min  Charges: OT General Charges $OT Visit: 1 Visit OT Treatments $Therapeutic Activity: 8-22 mins $Neuromuscular Re-education: 8-22 mins  Maurie Boettcher, OT/L   Acute OT Clinical Specialist Acute Rehabilitation Services Pager [phone] Office [phone]    [facility] 11/03/2020, 2:18 PM

## 2020-11-03 NOTE — Progress Notes (Signed)
ANTICOAGULATION CONSULT NOTE - Follow Up Consult  Pharmacy Consult for Warfarin Indication: LVAD  Allergies  Allergen Reactions  . Meclizine Hcl Anaphylaxis and Swelling  . Ivabradine Nausea Only    Patient Measurements: Height: 5' 10.5" (179.1 cm) Weight: 86.3 kg (190 lb 4.1 oz) IBW/kg (Calculated) : 74.15 Heparin Dosing Weight: 89.2 kg  Vital Signs: Temp: 98.4 F (36.9 C) (03/31 0751) Temp Source: Oral (03/31 0751) BP: 106/87 (03/31 0800) Pulse Rate: 83 (03/31 0454)  Labs: Recent Labs    11/01/20 0513 11/01/20 0521 11/01/20 2003 11/02/20 0517 11/03/20 0540  HGB  --  8.4* 8.3*  --   --   HCT  --  27.1* 27.3*  --   --   PLT  --  279 301  --   --   LABPROT 19.6*  --   --  21.3* 25.0*  INR 1.7*  --   --  1.9* 2.4*  CREATININE 3.11*  --   --  4.93* 6.37*    Estimated Creatinine Clearance: 13.1 mL/min (A) (by C-G formula based on SCr of 6.37 mg/dL (H)).   Medical History: Past Medical History:  Diagnosis Date  . AICD (automatic cardioverter/defibrillator) present    Single-chamber  implantable cardiac defibrillator - Medtronic  . Atrial fibrillation ([place])   . Cataract    Mixed form OU  . CHF (congestive heart failure) ([place])   . Chronic kidney disease   . Chronic kidney disease (CKD), stage III (moderate) (HCC)   . Chronic systolic heart failure ([place])    a. Echo 5/13: Mild LVE, mild LVH, EF 10%, anteroseptal, lateral, apical AK, mild MR, mild LAE, moderately reduced RVSF, mild RAE, PASP 60;  b. 07/2014 Echo: EF 20-2%, diff HK, AKI of antsep/apical/mid-apicalinferior, mod reduced RV.  Marland Kitchen Coronary artery disease    a. s/p CABG 2002 b. LHC 5/13:  dLM 80%, LAD subtotally occluded, pCFX occluded, pRCA 50%, mid? Occlusion with high take off of the PDA with 70% multiple lesions-not bypassed and supplies collaterals to LAD, LIMA-IM/ramus ok, S-OM ok, S-PLA branches ok. Medical therapy was recommended  . COVID   . Diabetic retinopathy (HCC)    NPDR OD, PDR OS  . Dyspnea    . Gout    "on daily RX" (01/08/2018)  . Hypertension   . Hypertensive retinopathy    OU  . Ischemic cardiomyopathy    a. Echo 5/13: Mild LVE, mild LVH, EF 10%, anteroseptal, lateral, apical AK, mild MR, mild LAE, moderately reduced RVSF, mild RAE, PASP 60;  b. 01/2012 s/p MDT [id number] Protecta XT VR AICD;  c. 07/2014 Echo: EF 20-2%, diff HK, AKI of antsep/apical/mid-apicalinferior, mod reduced RV.  Marland Kitchen MRSA (methicillin resistant Staphylococcus aureus)    Status post right foot plantar deep infection with MRSA status post  I&D 10/2008  . Myocardial infarction [facility])    "was told I'd had several before heart OR in 2002" (01/08/2018)  . Noncompliance   . Peripheral neuropathy   . Retinopathy, diabetic, background ([place])   . Syncope   . Type II diabetes mellitus ([place])    requiring insulin   . Vitreous hemorrhage, left (HCC)    and proliferative diabetic retinopathy    Assessment: 60 yo M s/p new LVAD implant with HeartMate III on 3/10. Warfarin started per MD on 3/12, and pharmacy asked to take over warfarin dosing.   INR therapeutic but up from 1.9 to 2.4 today. Currently taking 2-3 Ensures daily, eating well. Hgb 8.3 on last check yesterday. No  overt bleeding. LDH stable 200s  Goal of Therapy:  INR 2-2.5 Monitor platelets by anticoagulation protocol: Yes   Plan:  Warfarin 4 mg PO tonight  Daily PT/INR, CBC, monitor s/sx bleeding  [name], PharmD, BCPS PGY2 Cardiology Pharmacy Resident Phone: [phone] 11/03/2020  9:45 AM  Please check [email or web address] for unit-specific pharmacy phone numbers.

## 2020-11-03 NOTE — Progress Notes (Signed)
[facility] PROGRESS NOTE  Assessment/ Plan:  Acute kidney injury on chronic kidney disease stage III: With underlying chronic kidney disease likely from diabetes/hypertension and worsening renal function from hemodynamically mediated ATN and cardiorenal syndrome. CRRT 3/15-3/22 -Unfortunately, no signs of renal recovery at the moment  -CRRT restarted 3/24 - 3/27. Tolerated UF 1.5L with HD 3/28, 3L DUF 3/29 -Plan next HD 11/03/20 today -Will need CLIP to outpt unit with LVAD, TDC and eval for perm access prior to d/c.  It's going to be a long road with his LVAD, deconditioning but he wishes to try. CLIP initiated - Needs temp HD cath changed to Tunneled cath - will make IR aware, nonurgent -strict I/O  Hyponatremia -hypervolemic. Pain is also definitely a contributing factor. Pain ctrl per primary service. Na being managed with renal replacement therapy  Acute exacerbation of chronic systolic heart failure with cardiogenic shock: LVAD, placed 3/10. Volume status/UF managed with RRT  Anemia:Likely secondary to chronic disease and exacerbated by recent surgical losses. Cont ESA qMon.   Transfuse PRBC as needed.  Iron deficient, being repleted IV.  Atrial fibrillation: per cardiology  Protein calorie malnutrition, hypoalbuminemia: push protein, per primary  Pressure ulcers per primary, see above for pain control  Jannifer Hick MD [facility] Pager [phone]  Subjective: moved to [facility] yesterday.  On for iHD today.  Sleepy today but no new issues.   Walked 144ft yesterday.  Objective Vital signs in last 24 hours: Vitals:   11/02/20 2300 11/03/20 0454 11/03/20 0751 11/03/20 0800  BP: 104/67 103/85  106/87  Pulse: 76 83    Resp: 13 12    Temp: 98.5 F (36.9 C) 98.7 F (37.1 C) 98.4 F (36.9 C)   TempSrc: Oral Oral Oral   SpO2: 99% 99%    Weight:  86.3 kg    Height:       Weight change: 4.7 kg  Intake/Output Summary (Last 24 hours) at  11/03/2020 1155 Last data filed at 11/03/2020 0730 Gross per 24 hour  Intake 240 ml  Output --  Net 240 ml       Labs: Basic Metabolic Panel: Recent Labs  Lab 11/01/20 0513 11/02/20 0517 11/03/20 0540  NA 134* 132* 132*  K 4.1 4.7 5.2*  CL 98 95* 95*  CO2 28 26 25   GLUCOSE 86 207* 154*  BUN 35* 63* 87*  CREATININE 3.11* 4.93* 6.37*  CALCIUM 8.7* 9.0 9.2  PHOS 2.0* 3.0 4.8*   Liver Function Tests: Recent Labs  Lab 11/01/20 0513 11/02/20 0517 11/03/20 0540  ALBUMIN 2.3* 2.4* 2.4*   No results for input(s): LIPASE, AMYLASE in the last 168 hours. No results for input(s): AMMONIA in the last 168 hours. CBC: Recent Labs  Lab 10/29/20 0430 10/30/20 0404 10/31/20 0420 11/01/20 0521 11/01/20 2003  WBC 12.3* 9.8 11.3* 11.0* 12.2*  HGB 9.3* 8.9* 8.3* 8.4* 8.3*  HCT 30.9* 29.6* 27.2* 27.1* 27.3*  MCV 90.9 91.9 90.7 90.3 90.4  PLT 329 278 277 279 301   Cardiac Enzymes: No results for input(s): CKTOTAL, CKMB, CKMBINDEX, TROPONINI in the last 168 hours. CBG: Recent Labs  Lab 11/02/20 1134 11/02/20 1618 11/02/20 2140 11/03/20 0713 11/03/20 1101  GLUCAP 151* 117* 206* 153* 186*    Iron Studies:  Recent Labs    11/03/20 0540  IRON 40*  TIBC 344  FERRITIN 158   Studies/Results: ECHOCARDIOGRAM LIMITED  Result Date: 11/02/2020    ECHOCARDIOGRAM REPORT   Patient Name:   Marcus Johnson. Date of Exam: 11/02/2020 Medical Rec #:  [id number]                Height:       70.5 in Accession #:    [id number]               Weight:       179.9 lb Date of Birth:  11-Jun-1961                BSA:          2.006 m Patient Age:    1 years                 BP:           00/00 mmHg Patient Gender: M                        HR:           84 bpm. Exam Location:  Inpatient Procedure: Limited Echo and Color Doppler Indications:    LVAD evaluation  History:        Patient has prior history of Echocardiogram examinations, most                 recent 10/26/2020. CHF and Cardiomyopathy,  CAD, Prior CABG,                 Arrythmias:Atrial Fibrillation and Atrial Flutter; Risk                 Factors:Diabetes. ESRD.  Sonographer:    Dustin Flock Referring Phys: La[place] Comments: RAMP study IMPRESSIONS  1. RV size seemed to decrease at LVAD RPM 5200. Left ventricular ejection fraction, by estimation, is 20 to 25%. The left ventricle has severely decreased function. The left ventricle demonstrates global hypokinesis. The left ventricular internal cavity  size was mildly dilated. There is moderate left ventricular hypertrophy. Left ventricular diastolic function could not be evaluated.  2. Right ventricular systolic function is mildly reduced. The right ventricular size is severely enlarged.  3. The mitral valve was not well visualized. Trivial mitral valve regurgitation.  4. The aortic valve was not well visualized. Aortic valve regurgitation is mild. Mild aortic valve sclerosis is present, with no evidence of aortic valve stenosis.  5. Pulmonic valve regurgitation not assessed. FINDINGS  Left Ventricle: RV size seemed to decrease at LVAD RPM 5200. Left ventricular ejection fraction, by estimation, is 20 to 25%. The left ventricle has severely decreased function. The left ventricle demonstrates global hypokinesis. The left ventricular internal cavity size was mildly dilated. There is moderate left ventricular hypertrophy. Left ventricular diastolic function could not be evaluated. Right Ventricle: The right ventricular size is severely enlarged. Right vetricular wall thickness was not assessed. Right ventricular systolic function is mildly reduced. Left Atrium: Left atrial size was not assessed. Right Atrium: Right atrial size was not assessed. Pericardium: There is no evidence of pericardial effusion. Mitral Valve: The mitral valve was not well visualized. Trivial mitral valve regurgitation. Tricuspid Valve: The tricuspid valve is not well visualized. Tricuspid valve  regurgitation is mild. Aortic Valve: The aortic valve was not well visualized. Aortic valve regurgitation is mild. Mild aortic valve sclerosis is present, with no evidence of aortic valve stenosis. Pulmonic Valve: The pulmonic valve was not assessed. Pulmonic valve regurgitation not assessed. Aorta: The aortic root was not well visualized. IAS/Shunts: The interatrial septum was not assessed. Additional  Comments: A device lead is visualized.  LEFT VENTRICLE PLAX 2D LVIDd:         3.45 cm  Jenkins Rouge MD Electronically signed by Jenkins Rouge MD Signature Date/Time: 11/02/2020/2:13:00 PM    Final     Medications: Infusions: . sodium chloride    . sodium chloride    . ferumoxytol    . lactated ringers    . lactated ringers Stopped (10/17/20 1655)    Scheduled Medications: . (feeding supplement) PROSource Plus  30 mL Oral BID BM  . amiodarone  200 mg Oral BID  . aspirin EC  81 mg Oral Daily  . Chlorhexidine Gluconate Cloth  6 each Topical Daily  . Chlorhexidine Gluconate Cloth  6 each Topical Q0600  . collagenase   Topical Daily  . feeding supplement  237 mL Oral TID BM  . gabapentin  100 mg Oral QHS  . insulin aspart  0-20 Units Subcutaneous TID WC  . insulin aspart  0-5 Units Subcutaneous QHS  . insulin glargine  20 Units Subcutaneous BID  . midodrine  15 mg Oral TID WC  . multivitamin  1 tablet Oral QHS  . pantoprazole  40 mg Oral Daily  . polyethylene glycol  17 g Oral Daily  . sodium chloride flush  10-40 mL Intracatheter Q12H  . warfarin  4 mg Oral ONCE-1600  . Warfarin - Pharmacist Dosing Inpatient   Does not apply q1600    have reviewed scheduled and prn medications.  Physical Exam: General: NAD, sitting up in bed eating full breakfast Heart: Mechanical heart sound/lvad hum Lungs: cta bl Abdomen:soft, Non-tender, non-distended Extremities: no edema bl Dialysis Access: Temporary RIJ HD catheter.  Ria Comment A Aayla Marrocco 11/03/2020,11:55 AM  LOS: 36 days

## 2020-11-04 ENCOUNTER — Inpatient Hospital Stay (HOSPITAL_COMMUNITY): Payer: Medicare HMO

## 2020-11-04 DIAGNOSIS — N186 End stage renal disease: Secondary | ICD-10-CM | POA: Diagnosis not present

## 2020-11-04 DIAGNOSIS — I5023 Acute on chronic systolic (congestive) heart failure: Secondary | ICD-10-CM | POA: Diagnosis not present

## 2020-11-04 DIAGNOSIS — Z95811 Presence of heart assist device: Secondary | ICD-10-CM | POA: Diagnosis not present

## 2020-11-04 LAB — PROTIME-INR
INR: 2.8 — ABNORMAL HIGH (ref 0.8–1.2)
Prothrombin Time: 28.4 seconds — ABNORMAL HIGH (ref 11.4–15.2)

## 2020-11-04 LAB — COOXEMETRY PANEL
Carboxyhemoglobin: 2.1 % — ABNORMAL HIGH (ref 0.5–1.5)
Methemoglobin: 1.2 % (ref 0.0–1.5)
O2 Saturation: 56.1 %
Total hemoglobin: 7.7 g/dL — ABNORMAL LOW (ref 12.0–16.0)

## 2020-11-04 LAB — RENAL FUNCTION PANEL
Albumin: 2.9 g/dL — ABNORMAL LOW (ref 3.5–5.0)
Anion gap: 10 (ref 5–15)
BUN: 50 mg/dL — ABNORMAL HIGH (ref 6–20)
CO2: 27 mmol/L (ref 22–32)
Calcium: 8.8 mg/dL — ABNORMAL LOW (ref 8.9–10.3)
Chloride: 98 mmol/L (ref 98–111)
Creatinine, Ser: 4.32 mg/dL — ABNORMAL HIGH (ref 0.61–1.24)
GFR, Estimated: 15 mL/min — ABNORMAL LOW (ref >60)
Glucose, Bld: 125 mg/dL — ABNORMAL HIGH (ref 70–99)
Phosphorus: 3.9 mg/dL (ref 2.5–4.6)
Potassium: 4.3 mmol/L (ref 3.5–5.1)
Sodium: 135 mmol/L (ref 135–145)

## 2020-11-04 LAB — LACTATE DEHYDROGENASE: LDH: 220 U/L — ABNORMAL HIGH (ref 98–192)

## 2020-11-04 LAB — GLUCOSE, CAPILLARY
Glucose-Capillary: 99 mg/dL (ref 70–99)
Glucose-Capillary: 110 mg/dL — ABNORMAL HIGH (ref 70–99)
Glucose-Capillary: 95 mg/dL (ref 70–99)
Glucose-Capillary: 74 mg/dL (ref 70–99)

## 2020-11-04 LAB — MAGNESIUM: Magnesium: 2.2 mg/dL (ref 1.7–2.4)

## 2020-11-04 MED ORDER — WARFARIN SODIUM 2 MG PO TABS
2.0000 mg | ORAL_TABLET | Freq: Once | ORAL | Status: AC
  Administered 2020-11-04: 2 mg via ORAL
  Filled 2020-11-04: qty 1

## 2020-11-04 MED ORDER — SODIUM CHLORIDE 0.9 % IV SOLN
25.0000 mg | Freq: Once | INTRAVENOUS | Status: AC
  Administered 2020-11-04: 25 mg via INTRAVENOUS
  Filled 2020-11-04: qty 2

## 2020-11-04 MED ORDER — SODIUM CHLORIDE 0.9 % IV SOLN
125.0000 mg | INTRAVENOUS | Status: DC
  Administered 2020-11-05 – 2020-11-10 (×3): 125 mg via INTRAVENOUS
  Filled 2020-11-04 (×5): qty 10

## 2020-11-04 MED ORDER — SILDENAFIL CITRATE 20 MG PO TABS
20.0000 mg | ORAL_TABLET | Freq: Three times a day (TID) | ORAL | Status: DC
  Administered 2020-11-04 – 2020-11-07 (×12): 20 mg via ORAL
  Filled 2020-11-04 (×12): qty 1

## 2020-11-04 MED ORDER — CEFAZOLIN SODIUM-DEXTROSE 2-4 GM/100ML-% IV SOLN
2.0000 g | Freq: Once | INTRAVENOUS | Status: AC
  Administered 2020-11-07: 2 g via INTRAVENOUS
  Filled 2020-11-04: qty 100

## 2020-11-04 NOTE — Progress Notes (Addendum)
Patient ID: Marcus Sandoval., male   DOB: 12-Dec-1960, 60 y.o.   MRN: [id number]     Advanced Heart Failure Rounding Note  PCP-Cardiologist: No primary care provider on file.   Subjective:    - 2/27 Milrinone switched to dobutamine 5 mcg.  - 2/28 Moved to ICU. Dobutamine increased to 7.5 mcg and Norepi 3 mcg added. Diuretics held.  - 3/1 Impella 5.5 placed.  - 3/4 Teeth removed - 3/10 HM3 LVAD placed - 3/13 VAD speed increased to 5500. [name] out  - 3/14 Nephrology consulted. Given 1UPRBCs  - 3/15 Started CRRT. Given 1UPRBCs.  - 3/23 VAD Speed turned down to 5300  - 3/23 CRRT and Milrinone Stopped. Back in Afib - 3/24 CRRT restarted.  - 3/27 CVVH off.  -3/28 iHD -3/29 iHD. Walked 75 feet!! -3/20 Ramp Echo: Fixed speed: 5200 Low speed limit: 4900  Remains anuric. On iHD. Wt down 3 lb. CVP 12  Co-ox marginal at 56%  INR 2.8   Has nausea and vomiting x 1 ~4 am today. Feeling better now. Tolerating breakfast ok. No complaints.    Echo: EF 20-25%, normal RV, PASP 61, dilated IVC.   LVAD Interrogation HM 3: Speed: 5200 Flow: 4.1  PI: 4.9   Power: 3.8.  1  PI event  Objective:   Weight Range: 84.1 kg Body mass index is 26.23 kg/m.   Vital Signs:   Temp:  [98 F (36.7 C)-98.9 F (37.2 C)] 98.5 F (36.9 C) (04/01 0354) Pulse Rate:  [45-82] 79 (04/01 0354) Resp:  [15-18] 17 (04/01 0354) BP: (86-121)/(21-105) 94/79 (04/01 0600) SpO2:  [97 %-98 %] 98 % (04/01 0354) Weight:  [84.1 kg-85.5 kg] 84.1 kg (04/01 0648) Last BM Date: 10/31/20  Weight change: Filed Weights   11/03/20 1349 11/03/20 1710 11/04/20 0648  Weight: 85.5 kg 85.5 kg 84.1 kg    Intake/Output:   Intake/Output Summary (Last 24 hours) at 11/04/2020 0729 Last data filed at 11/03/2020 1700 Gross per 24 hour  Intake 240 ml  Output -240 ml  Net 480 ml      Physical Exam   MAPs 90s   CVP 12  GENERAL: mildly fatigue appearing. No acute distress. HEENT: normal  NECK: Supple, JVD to jaw 2+  bilaterally, no bruits.  No lymphadenopathy or thyromegaly appreciated.   CARDIAC:  Mechanical heart sounds with LVAD hum present. + Rt IJ HD catheter. LUNGS:  Clear to auscultation bilaterally. No wheezing ABDOMEN:  Soft, round, nontender, positive bowel sounds x4.     LVAD exit site:  Dressing dry and intact.  No erythema or drainage.  Stabilization device present and accurately applied.  Driveline dressing is being changed daily per sterile technique. EXTREMITIES:  Warm and dry, no cyanosis, clubbing, rash or edema . + RUE PICC  NEUROLOGIC:  Alert and oriented x 3.    No aphasia.  No dysarthria.  Flat affect but  pleasant.     Telemetry   NSR 80s personally reviewed.    Labs    CBC Recent Labs    11/01/20 2003  WBC 12.2*  HGB 8.3*  HCT 27.3*  MCV 90.4  PLT 542   Basic Metabolic Panel Recent Labs    11/03/20 0540 11/04/20 0334  NA 132* 135  K 5.2* 4.3  CL 95* 98  CO2 25 27  GLUCOSE 154* 125*  BUN 87* 50*  CREATININE 6.37* 4.32*  CALCIUM 9.2 8.8*  MG 2.5* 2.2  PHOS 4.8* 3.9   Liver Function  Tests Recent Labs    11/03/20 0540 11/04/20 0334  ALBUMIN 2.4* 2.9*   No results for input(s): LIPASE, AMYLASE in the last 72 hours. Cardiac Enzymes No results for input(s): CKTOTAL, CKMB, CKMBINDEX, TROPONINI in the last 72 hours.  BNP: BNP (last 3 results) Recent Labs    10/20/20 0305 10/27/20 0020 11/02/20 2310  BNP 466.7* 774.9* 486.0*    ProBNP (last 3 results) No results for input(s): PROBNP in the last 8760 hours.   D-Dimer No results for input(s): DDIMER in the last 72 hours. Hemoglobin A1C No results for input(s): HGBA1C in the last 72 hours. Fasting Lipid Panel No results for input(s): CHOL, HDL, LDLCALC, TRIG, CHOLHDL, LDLDIRECT in the last 72 hours. Thyroid Function Tests No results for input(s): TSH, T4TOTAL, T3FREE, THYROIDAB in the last 72 hours.  Invalid input(s): FREET3  Other results:   Imaging    No results  found.   Medications:     Scheduled Medications: . (feeding supplement) PROSource Plus  30 mL Oral BID BM  . amiodarone  200 mg Oral BID  . aspirin EC  81 mg Oral Daily  . Chlorhexidine Gluconate Cloth  6 each Topical Daily  . Chlorhexidine Gluconate Cloth  6 each Topical Q0600  . collagenase   Topical Daily  . feeding supplement  237 mL Oral TID BM  . gabapentin  100 mg Oral QHS  . insulin aspart  0-20 Units Subcutaneous TID WC  . insulin aspart  0-5 Units Subcutaneous QHS  . insulin glargine  20 Units Subcutaneous BID  . midodrine  15 mg Oral TID WC  . multivitamin  1 tablet Oral QHS  . pantoprazole  40 mg Oral Daily  . polyethylene glycol  17 g Oral Daily  . sodium chloride flush  10-40 mL Intracatheter Q12H  . Warfarin - Pharmacist Dosing Inpatient   Does not apply q1600    Infusions: . sodium chloride    . sodium chloride    . lactated ringers Stopped (10/17/20 1655)    PRN Medications: sodium chloride, sodium chloride, acetaminophen, alteplase, dextrose, fentaNYL (SUBLIMAZE) injection, heparin, heparin, lidocaine (PF), lidocaine-prilocaine, ondansetron (ZOFRAN) IV, oxyCODONE, pentafluoroprop-tetrafluoroeth, senna-docusate, sodium chloride flush, traZODone, white petrolatum    Assessment/Plan   1.Acute on chronic systolic HF -> cardiogenic shock:ICM.HasMedtronic ICD. EF 15%. Low output persisted despite milrinone and then dobutamine with rise in creatinine to 2.5. Impella 5.5 placed 3/1.  Creatinine improved to 1.7 and HM3 LVAD was placed on 3/10. Developed post-op renal failure requiring CVVH. Now off milrinone and epinephrine. Last echo showed moderate RV enlargement with mildly decreased RV function and VAD speed decreased to 5300 due to left-sided septum. CRRT stopped 3/23 but restarted 3/24. CVVH stopped again on 3/27. Ramp echo 3/30:  Fixed speed: 5200 Low speed limit: 4900 Tolerated iHD  - co-ox 56% today. Off pressors.  - Continue midodrine 15 mg tid.  -  Off sildenafil.  2. LVAD: 3/13 VAD speed increased to 5500. 3/23 speed decreased to 5300. VAD interrogated personally. Parameters stable but still frequent PIs.  LDH stable at 245  - INR 2.8 today on warfarin, on ASA 81 daily. Drinking supplements. Discussed warfarin dose with pharmacy.  - DL site ok 3. CAD s/p CABG2002:Last cath in 6/19 with patent grafts.  - Continue Crestor 40 mg daily.  4. AKI on CKD Stage 3b: Suspect that this is a combination of cardiorenal syndrome and diabetic nephropathy and possibly ATN. Developed post-op AKI. Started on CRRT 3/15. CVVH stopped 3/23  but restarted 3/24.  - Tolerating iHD  5. Atrial flutter/fibrillation: s/p DC-CV 08/21/18. Maintaining NSR.     - Continue po amiodarone.  6. Wound R Foot: Partial thickness skin loss noted. Cover with mepilex border and change every 5 days.  7. Right arm pain: Think neuropathic, per Dr. Cyndia Bent patient had stretch of brachial plexus with Impella placement.  8. ID:  WBCs normal. Afebrile. Blood Cx- NGTD.  9. HD:QQIWLNL of very poor control but hgbA1ctrending down. Most recent 7.7%.  Insulin drip stopped 3/24.  Now on lantus. Stop meal coverage.   - Diabetes coordinators.  10. Anemia: Stable hgb.  Low transferrin saturation. Give IV Fe today 11. Deconditioning - PT/OT following, needs aggressive work.   - CIR consulted 12. Unstageable Pressure Ulcer on buttokcs.  - On hydrotherapy Mon-Sat.  - Continue reposition every 2 hours side lying position. Reposition frequently in the chair. 13. Hyperkalemia - improved K 5.2>>4.3 today   - C/w iHD + PRN Lokelma on non HD days    Lyda Jester PA-C  11/04/2020 7:29 AM  Patient seen with PA, agree with the above note.    Walked with PT today, did well.    Had some nausea this morning and has had vertigo on and off in the hospital, having it today.    Tolerated HD yesterday.   General: Well appearing this am. NAD.  HEENT: Normal. Neck: Supple, JVP 10 cm.  Carotids OK.  Cardiac:  Mechanical heart sounds with LVAD hum present.  Lungs:  CTAB, normal effort.  Abdomen:  NT, ND, no HSM. No bruits or masses. +BS  LVAD exit site: Well-healed and incorporated. Dressing dry and intact. No erythema or drainage. Stabilization device present and accurately applied. Driveline dressing changed daily per sterile technique. Extremities:  Warm and dry. No cyanosis, clubbing, rash, or edema.  Neuro:  Alert & oriented x 3. Cranial nerves grossly intact. Moves all 4 extremities w/o difficulty. Affect pleasant    MAP higher today.  Tolerated HD yesterday.  It looks like he will be HD dependent, will need tunneled catheter => nephrology to arrange.   Think we can start sildenafil for RV failure.   Cannot start meclizine for vertigo => apparently had anaphylaxis in the past.   Continue PT, hope for CIR next week.   Loralie Champagne 11/04/2020 8:42 AM

## 2020-11-04 NOTE — Progress Notes (Signed)
Physical Therapy Wound Treatment/Discharge Patient Details  Name: Marcus Johnson. MRN: [id number] Date of Birth: Apr 19, 1961  Today's Date: 11/04/2020 Time: 2248-2500 Time Calculation (min): 25 min  Subjective  Subjective Assessment Subjective: "I'm going to transfer to a different room today." Patient and Family Stated Goals: did not state Date of Onset:  (unknown) Prior Treatments: dressing change  Pain Score:  Pt premedicated.   Wound Assessment     Pressure Injury 10/16/20 Coccyx Mid Stage 2 -  Partial thickness loss of dermis presenting as a shallow open injury with a red, pink wound bed without slough. Blister that has drained, 2 cm X 4.5 cm (Active)  Wound Image   11/04/20 1018  Dressing Type ABD;Barrier Film (skin prep);Gauze (Comment);Moist to moist 11/04/20 1018  Dressing Clean;Dry;Intact 11/04/20 1018  Dressing Change Frequency Daily 11/04/20 1018  State of Healing Early/partial granulation 11/04/20 1018  Site / Wound Assessment Yellow;Pink;Pale 11/04/20 1018  % Wound base Red or Granulating 80% 11/04/20 1018  % Wound base Yellow/Fibrinous Exudate 20% 11/04/20 1018  % Wound base Black/Eschar 0% 11/04/20 1018  % Wound base Other/Granulation Tissue (Comment) 0% 11/04/20 1018  Peri-wound Assessment Intact 11/04/20 1018  Wound Length (cm) 4 cm 10/29/20 1100  Wound Width (cm) 7 cm 10/29/20 1100  Wound Depth (cm) 0.1 cm 10/29/20 1100  Wound Surface Area (cm^2) 28 cm^2 10/29/20 1100  Wound Volume (cm^3) 2.8 cm^3 10/29/20 1100  Margins Unattached edges (unapproximated) 11/04/20 1018  Drainage Amount Scant 11/04/20 1018  Drainage Description Serous 11/04/20 1018  Treatment Debridement (Selective);Hydrotherapy (Pulse lavage);Packing (Saline gauze) 11/04/20 1018   Santyl applied to wound bed prior to applying dressing.    Hydrotherapy Pulsed lavage therapy - wound location: coccyx Pulsed Lavage with Suction (psi): 8 psi Pulsed Lavage with Suction - Normal Saline  Used: 1000 mL Pulsed Lavage Tip: Tip with splash shield Selective Debridement Selective Debridement - Location: coccyx Selective Debridement - Tools Used: Forceps,Scissors Selective Debridement - Tissue Removed: yellow slough    Wound Assessment and Plan  Wound Therapy - Assess/Plan/Recommendations Wound Therapy - Clinical Statement: A few areas of adherent yellow slough. Do not fell further hydro will be of benefit. Will dc from hydrotherapy with nursing to continue dressing changes. Will notify [place] nurse of hydro dc. Wound Therapy - Functional Problem List: decreased mobility Factors Delaying/Impairing Wound Healing: Immobility,Multiple medical problems Wound Therapy - Follow Up Recommendations: dressing changes by RN  Wound Therapy Goals- Improve the function of patient's integumentary system by progressing the wound(s) through the phases of wound healing (inflammation - proliferation - remodeling) by: Wound Therapy Goals - Improve the function of patient's integumentary system by progressing the wound(s) through the phases of wound healing by: Decrease Necrotic Tissue to: 10 Decrease Necrotic Tissue - Progress: Progressing toward goal Increase Granulation Tissue to: 90 Increase Granulation Tissue - Progress: Progressing toward goal  Goals will be updated until maximal potential achieved or discharge criteria met.  Discharge criteria: when goals achieved, discharge from hospital, MD decision/surgical intervention, no progress towards goals, refusal/missing three consecutive treatments without notification or medical reason.  GP     Charges PT Wound Care Charges $Wound Debridement up to 20 cm: < or equal to 20 cm $PT Hydrotherapy Dressing: 1 dressing $PT PLS Gun and Tip: 1 Supply $PT Hydrotherapy Visit: 1 Visit       Shary Decamp [facility] 11/04/2020, 10:25 AM [place] Pager [phone] Office [phone]

## 2020-11-04 NOTE — Progress Notes (Signed)
LVAD Coordinator Rounding Note:  Admitted 09/28/20 due to worsening heart failure symptoms. Recent COVID infection January 2022; did not take medications for 3-4 weeks.   S/p dental extractions 10/07/20 per Dr Benson Norway.  HM III LVAD implanted on 10/13/20 by Dr Cyndia Bent under Destination Therapy criteria.  Pt lying in bed resting upon my arrival. He ambulated 100 ft in the hallway this morning, but was limited by dizziness.   PT at bedside to complete final hydrotherapy session. Per [place] RN note will advance to BID wet to dry dressings. Continue turning from side to side to alleviate pressure on wound bed.  Reports continued weakness/numbness of his right hand. He has ball and putty at bedside, and a list of exercises that he is to complete throughout the day to increase dexterity in his hand. Will need extensive therapy in order to independently perform power source changes/handle VAD equipment. Encouraged patient and bedside RN to complete exercises several times a day to increase hand strength.   Intermittent HD started; tolerated well. Plan for HD again tomorrow. Will need tunneled catheter per nephrology.   VAD education as documented below. Pt's brother is unable to come for further education this week, and patient's son is out of town until this weekend. Will set up education time with son and brother starting next week.   Vital signs: Temp: 98.5 HR: 76 Doppler: 91 Auto cuff: 80/64 (70) O2 Sat: 98% on RA Wt: 200.1>221.7>219.5...Marland Kitchen..182.7>174.8>177.2>183.2>180>175>178>185.8>181>179.9>190.2>185.4 lbs    LVAD interrogation reveals:  Speed: 5200 Flow: 4.4 Power: 3.7w PI: 4.0 Hematocrit: 31  Alarms: none Events: none  Fixed speed: 5200 Low speed limit: 4900  Drive Line: CDI. Drive line anchor correctly applied. Existing VAD dressing removed and site care performed using sterile technique. Drive line exit site cleaned with Chlora prep applicators x 2, allowed to dry, and gauze dressing  with silver strip re-applied; skin tears healing. NO skin prep used due to skin intolerance of same. Exit site healing and unincorporated the velour is fully implanted at exit site. There is 1 suture visible that is wrapped around the driveline. Small amount of yellow drainage from fat necrosis. No redness, tenderness, foul odor or rash noted. Drive line anchor replaced. May advance to Monday, Wednesday, Friday dressing changes by VAD coordinator or nurse champion. Next dressing change due 11/07/20.   Labs:  LDH trend: 444>670>575>571.....370>361>327>310>306>281>259>246>244>245>220  INR trend: 1.4>1.7>1.8>1.7>1.6>1.8............2.0>2.2>2.1>1.8>1.9>1.7>2.4>2.8  Creatinine: 1.84>3.0>3.4>2.73.....1.76>1.68>2.55>3.53>2.54>2.6>2.2>1.78>2.68>3.11>6.37>4.32  Anticoagulation Plan: -INR Goal: 2.0-2.5 -ASA Dose: 81 mg  Blood Products:  IntraOp:   1 Platelet  6 FFP  3 PRBC  1035 cell saver Postop:   10/13/20:   4 FFP  4 PRBC    10/14/20:  1 FFP  1 PRBC   10/16/20:  1 PRBC   10/18/20:  1 PRBC   10/16/20:  1 PRBC   10/18/20:  1 PRBC   10/27/20:  1 PRBC  Device: -  Medtronic single - Therapies: off   Arrythmias: VT in OR requiring defibrillation   Renal: CRRT   - started 10/19/20   - stopped 10/26/20   - re-started 10/27/20  - stopped 10/30/20   iHD started 10/31/20   Infection: - 10/27/20 blood cultures - no growth x 5 days; final   Patient Education: 1. Pt not feeling well today. Reports he is dizzy, tired, and in pain. Education not appropriate at this time 2. Will plan time to continue VAD education next week when [name] and pt's son [name] are available to come to hospital.  3. Patient discharge  education binder brought to bedside. Encouraged patient to begin reviewing over the weekend.  Plan/Recommendations:  1. Call VAD coordinator for equipment or drive line issues 2. MWF drive line dressing change per VAD coordinator or nurse champion. Next dressing change due  11/07/20.   Emerson Monte RN [place] Coordinator  Office: [phone]  24/7 Pager: [phone]

## 2020-11-04 NOTE — Progress Notes (Signed)
Physical Therapy Treatment Patient Details Name: Marcus Johnson. MRN: [id number] DOB: 10-Apr-1961 Today's Date: 11/04/2020    History of Present Illness 60 y.o. male admitted 2/28 with ICM for LVAD workup. Impella placed 3/1. Heartmate 3 LVAD placed 3/10. CRRT started 3/15. CRRT held 3/23-3/24 then restarted until 3/27 with iHD started 3/28. PMHx: DM2, CAD s/p CABG 7371, systolic HF due to ischemic cardiomyopathy with EF 20-25% (echo 12/15), DM2 and CKD. He is s/p Med[employer] single chamber ICD. Covid 1/22.    PT Comments    Pt pleasant and reports lack of sleep and nausea this am. Pt with continued improvements in transfers and gait with noted nystagmus with return to sidelying but pt remains unable to tolerate positioning for canalith testing and repositioning at this time and discussed Meclazine with Dr.McLean. Pt also with intense pain of sacral wound limiting sitting tolerance with pt not wanting to take pain medicine due to the reaction to the medicine. Continuing to educate and assist for power transitions.   HR 86 Power 3.7, speed 5200, flow 4.2, PI 4.4 with drop to 3.3 with gait    Follow Up Recommendations  Supervision/Assistance - 24 hour;CIR     Equipment Recommendations  3in1 (PT)    Recommendations for Other Services       Precautions / Restrictions Precautions Precautions: Fall;Sternal;Other (comment) Precaution Comments: LVAD, sacral wound; R IJ dialysis catheter, ? left BPPV Restrictions Weight Bearing Restrictions: Yes (sternal prec.)    Mobility  Bed Mobility Overal bed mobility: Needs Assistance Bed Mobility: Rolling;Sidelying to Sit;Sit to Sidelying Rolling: Supervision Sidelying to sit: Min assist     Sit to sidelying: Min assist General bed mobility comments: pt able to roll without assist and rise with min assist from left without nystagmus with rise. Return to bed assist for legs to surface with grossly 15 sec of left downward rotational  nystagmus.    Transfers Overall transfer level: Needs assistance   Transfers: Sit to/from Stand Sit to Stand: Min assist         General transfer comment: cues for hand placement with pt able to stand 3x during session  Ambulation/Gait Ambulation/Gait assistance: Min assist;+2 safety/equipment Gait Distance (Feet): 115 Feet Assistive device: Rolling walker (2 wheeled);None Gait Pattern/deviations: Step-through pattern;Decreased stride length   Gait velocity interpretation: 1.31 - 2.62 ft/sec, indicative of limited community ambulator General Gait Details: pt walked initial 12' with RW with min assist to direct and control RW. After seated rest walked additional 80' without UE support with assist for IV and chair follow with improved control of trunk. Limited by fatigue. PI 3.3 with gait   Stairs             Wheelchair Mobility    Modified Rankin (Stroke Patients Only)       Balance Overall balance assessment: Needs assistance   Sitting balance-Leahy Scale: Good Sitting balance - Comments: EOB without assist     Standing balance-Leahy Scale: Good Standing balance comment: pt walking without UE support this session                            Cognition Arousal/Alertness: Awake/alert Behavior During Therapy: WFL for tasks assessed/performed Overall Cognitive Status: Impaired/Different from baseline Area of Impairment: Attention;Awareness;Problem solving                   Current Attention Level: Selective Memory: Decreased short-term memory Following Commands: Follows one step commands consistently  Problem Solving: Slow processing General Comments: pt recalling all sternal precautions appropriately and contents of black bag. Max cues and assist for power transition to and from battery      Exercises General Exercises - Lower Extremity Short Arc Quad: AROM;Both;Supine;20 reps Hip Flexion/Marching: Both;Seated;20 reps;AROM     General Comments        Pertinent Vitals/Pain Pain Score: 7  Pain Location: sacrum Pain Descriptors / Indicators: Constant;Discomfort;Grimacing;Guarding Pain Intervention(s): Limited activity within patient's tolerance;Monitored during session;Repositioned;Other (comment) (pt denied pain medicine and sitting in chair)    Home Living                      Prior Function            PT Goals (current goals can now be found in the care plan section) Progress towards PT goals: Progressing toward goals    Frequency    Min 4X/week      PT Plan Current plan remains appropriate    Co-evaluation              AM-PAC PT "6 Clicks" Mobility   Outcome Measure  Help needed turning from your back to your side while in a flat bed without using bedrails?: A Little Help needed moving from lying on your back to sitting on the side of a flat bed without using bedrails?: A Little Help needed moving to and from a bed to a chair (including a wheelchair)?: A Little Help needed standing up from a chair using your arms (e.g., wheelchair or bedside chair)?: A Little Help needed to walk in hospital room?: A Lot Help needed climbing 3-5 steps with a railing? : A Lot 6 Click Score: 16    End of Session Equipment Utilized During Treatment: Gait belt Activity Tolerance: Patient tolerated treatment well Patient left: in bed;with call bell/phone within reach Nurse Communication: Mobility status;Precautions PT Visit Diagnosis: Other abnormalities of gait and mobility (R26.89);Muscle weakness (generalized) (M62.81);Difficulty in walking, not elsewhere classified (R26.2);Pain     Time: 0802-0839 PT Time Calculation (min) (ACUTE ONLY): 37 min  Charges:  $Gait Training: 8-22 mins $Therapeutic Activity: 8-22 mins                     Hila Bolding P, PT Acute Rehabilitation Services Pager: [phone] Office: [place] 11/04/2020, 10:30 AM

## 2020-11-04 NOTE — Consult Note (Signed)
   [facility] CM Inpatient Consult   11/04/2020  [place] July 23, 1961 [id number]   [place] Organization [ACO] Patient: [facility] HMO  Patient screened for hospitalization with noted extreme high risk score for unplanned readmission risk and long length of stay [36 day] hospitalization to assess for potential [place] Management service needs for post hospital transition.  Review of patient's medical record reveals patient's primary care provider is a [facility] practice that has a chronic care management team.   Plan:  Continue to follow progress and disposition to assess for post hospital care management needs.    For questions contact:   Natividad Brood, RN BSN [facility] Liaison  [phone] business mobile phone Toll free office [phone]  Fax number: [phone] [email or web address]

## 2020-11-04 NOTE — Consult Note (Signed)
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 PORT 1 VIEW  Result Date: 10/10/2020 CLINICAL DATA:  Chest pain EXAM: PORTABLE CHEST 1 VIEW COMPARISON:  October 10, 2020 study obtained earlier in the day FINDINGS: Impella device unchanged in position. Swan-Ganz catheter tip is in the main pulmonary outflow tract directed slightly toward the right. A right peripherally inserted central catheter present with tip in superior vena cava. Pacemaker lead attached to right ventricle. No pneumothorax. Lungs are clear. Heart is mildly enlarged with pulmonary vascularity normal. No adenopathy. No bone lesions. IMPRESSION: Tube and catheter positions as described without evident pneumothorax. Lungs clear. Stable cardiac prominence. Electronically Signed   By: Lowella Grip III M.D.   On: 10/10/2020 08:19   DG  Abd Portable 1V  Result Date: 10/17/2020 CLINICAL DATA:  Feeding tube placement EXAM: PORTABLE ABDOMEN - 1 VIEW COMPARISON:  None. FINDINGS: Enteric tube passes into the stomach with tip likely within the fundus obscured by LVAD. Bowel gas pattern is unremarkable. IMPRESSION: Enteric tube within the fundus of the stomach. Electronically Signed   By: Macy Mis M.D.    On: 10/17/2020 14:13   DG Abd Portable 1V  Result Date: 10/17/2020 CLINICAL DATA:  Feeding tube placement. EXAM: PORTABLE ABDOMEN - 1 VIEW COMPARISON:  None. FINDINGS: The bowel gas pattern is normal. There appears to be a feeding tube in the proximal stomach, although distal tip is obscured due to overlying left ventricular assist device. No radio-opaque calculi or other significant radiographic abnormality are seen. IMPRESSION: No evidence of bowel obstruction or ileus. Distal tip of feeding tube is obscured due to overlying left ventricular assist device. Electronically Signed   By: Marijo Conception M.D.   On: 10/17/2020 14:12   DG Abd Portable 1V  Result Date: 10/17/2020 CLINICAL DATA:  Feeding tube placement EXAM: PORTABLE ABDOMEN - 1 VIEW COMPARISON:  Portable exam 1350 hours compared to 1345 hours FINDINGS: Tip of feeding tube projects over the region of the pylorus. LVAD and pacemaker with leads present. Bowel gas pattern normal. Epicardial pacing wires present. Visualized bowel gas pattern normal. IMPRESSION: Tip of feeding tube projects over pylorus. Electronically Signed   By: Lavonia Dana M.D.   On: 10/17/2020 14:11   ECHO INTRAOPERATIVE TEE  Result Date: 10/27/2020  *INTRAOPERATIVE TRANSESOPHAGEAL REPORT *  Patient Name:   Marcus Johnson. Date of Exam: 10/13/2020 Medical Rec #:  [id number]                Height:       70.5 in Accession #:    [id number]               Weight:       200.2 lb Date of Birth:  19-Feb-1961                BSA:          2.10 m Patient Age:    60 years                 BP:           100/73 mmHg Patient Gender: M                        HR:           106 bpm. Exam Location:  Anesthesiology Transesophogeal exam was perform intraoperatively during surgical procedure. Patient was closely monitored under general anesthesia during the entirety of examination. Indications:     insertion of LVAD Performing Phys: [place] Diagnosing Phys: Oleta Mouse MD  Complications: No known complications during this procedure. POST-OP IMPRESSIONS - Aorta: there is no dissection present in the aorta. - Comments: There is an LVAD inflow cannula in the LV apex with the tip directed toward central LV cavity. There is no mitral stenosis or LVAD inflow cannula obstruction when LV cavity full. The aortic valave opens occasionally during systole. There is no aortic regurgitation with Impella removed. There is no mitral regurgitaion. The TR remains severe. The RV is severely dilated with mild reduction in systolic function. PRE-OP FINDINGS  Left Ventricle: The left ventricle severe 10-15%. The cavity size was moderately dilated. There is concentric left ventricular hypertrophy.  There is borderline concentric left ventricular hypertrophy. Right Ventricle: The right ventricle has moderately reduced systolic function. The cavity was dialated. There is no increase in right ventricular wall thickness. Left Atrium: Left atrial size was normal in size. No left atrial/left atrial appendage thrombus was detected. Left atrial appendage velocity is reduced at less than 40 cm/s. Right Atrium: Right atrial size was normal in size. Interatrial Septum: No atrial level shunt detected by color flow Doppler. There is no evidence of a patent foramen ovale. Pericardium: Trivial pericardial effusion is present. The pericardial effusion is posterior to the left ventricle. Mitral Valve: The mitral valve is dilated. Mitral valve regurgitation is trivial by color flow Doppler. The MR jet is centrally-directed. There is No evidence of mitral stenosis. There is mild calcification present on the mitral valve anterior and posterior cusps with normal mobility. Tricuspid Valve: The tricuspid valve was normal in structure. Tricuspid valve regurgitation is severe by color flow Doppler. The jet is directed toward the atrial septum. No evidence of tricuspid stenosis is present. There is mild calcification present on the  tricuspid valve anterior cusp with normal mobility. Pacemaker lead appears to be restricting mobility of the septal leaflet during systole imparing coaptation and precipitating regurgitation. Aortic Valve: The aortic valve is tricuspid Aortic valve regurgitation is mild by color flow Doppler. There is not assessed of the aortic valve. There is an Impella across the aortic valve with associated regurgitation. Unable to assess valve function with device in place. Pulmonic Valve: The pulmonic valve was normal in structure, with normal. Pulmonic valve regurgitation is not visualized by color flow Doppler. Aorta: The aortic root and ascending aorta are normal in size and structure. There is evidence of a dissection in the none. Venous: The inferior vena cava is dilated in size with greater than 50% respiratory variability, suggesting right atrial pressure of 8 mmHg. +--------------+--------++ LEFT VENTRICLE         +--------------+--------++ PLAX 2D                +--------------+--------++ LVOT diam:    2.20 cm  +--------------+--------++ LVOT Area:    3.80 cm +--------------+--------++                        +--------------+--------++ +-------------+---------++ MITRAL VALVE           +--------------+-------+ +-------------+---------++ SHUNTS                MV Peak grad:2.7 mmHg  +--------------+-------+ +-------------+---------++ Systemic Diam:2.20 cm MV Mean grad:1.0 mmHg  +--------------+-------+ +-------------+---------++ MV Vmax:     0.82 m/s  +-------------+---------++ MV Vmean:    32.3 cm/s +-------------+---------++ MV VTI:      0.13 m    +-------------+---------++  Oleta Mouse MD Electronically signed by Oleta Mouse MD Signature Date/Time: 10/27/2020/12:07:58 PM    Final    VAS [place] UPPER EXT VEIN MAPPING (PRE-OP AVF)  Result Date: 11/04/2020 UPPER EXTREMITY VEIN MAPPING  Indications: Pre-access. Comparison Study: no prior Performing Technologist:  Abram Sander RVS  Examination Guidelines: A complete evaluation includes B-mode imaging, spectral Doppler, color Doppler, and power Doppler as needed of all accessible portions of each vessel. Bilateral testing is considered an integral part of a complete examination. Limited examinations for reoccurring indications may be performed as noted. +-----------------+-------------+----------+--------------+ Right Cephalic   Diameter (cm)Depth (cm)   Findings    +-----------------+-------------+----------+--------------+ Shoulder             0.18  1.25                  +-----------------+-------------+----------+--------------+ Prox upper arm       0.32        1.17                  +-----------------+-------------+----------+--------------+ Mid upper arm                           not visualized +-----------------+-------------+----------+--------------+ Dist upper arm                          not visualized +-----------------+-------------+----------+--------------+ Antecubital fossa    0.43        0.37                  +-----------------+-------------+----------+--------------+ Prox forearm         0.34        0.57                  +-----------------+-------------+----------+--------------+ Mid forearm          0.18        0.49                  +-----------------+-------------+----------+--------------+ Dist forearm         0.17        0.42                  +-----------------+-------------+----------+--------------+ Wrist                0.14        0.30                  +-----------------+-------------+----------+--------------+ +-----------------+-------------+----------+--------------+ Right Basilic    Diameter (cm)Depth (cm)   Findings    +-----------------+-------------+----------+--------------+ Prox upper arm       0.39        1.49                  +-----------------+-------------+----------+--------------+ Mid upper arm        0.39        1.35                   +-----------------+-------------+----------+--------------+ Dist upper arm       0.41        0.86                  +-----------------+-------------+----------+--------------+ Antecubital fossa    0.32        0.66                  +-----------------+-------------+----------+--------------+ Prox forearm         0.20        0.33     branching    +-----------------+-------------+----------+--------------+ Mid forearm                             not visualized +-----------------+-------------+----------+--------------+ Distal forearm                          not visualized +-----------------+-------------+----------+--------------+ Elbow                                   not visualized +-----------------+-------------+----------+--------------+ Wrist  not visualized +-----------------+-------------+----------+--------------+ +-----------------+-------------+----------+--------------+ Left Cephalic    Diameter (cm)Depth (cm)   Findings    +-----------------+-------------+----------+--------------+ Shoulder             0.24        1.20                  +-----------------+-------------+----------+--------------+ Prox upper arm       0.24        1.01                  +-----------------+-------------+----------+--------------+ Mid upper arm        0.34        0.63                  +-----------------+-------------+----------+--------------+ Dist upper arm       0.29        0.71                  +-----------------+-------------+----------+--------------+ Antecubital fossa    0.38        0.46                  +-----------------+-------------+----------+--------------+ Prox forearm         0.37        0.47                  +-----------------+-------------+----------+--------------+ Mid forearm          0.30        0.30     branching    +-----------------+-------------+----------+--------------+ Dist forearm                             not visualized +-----------------+-------------+----------+--------------+ Wrist                                   not visualized +-----------------+-------------+----------+--------------+ +-----------------+-------------+----------+--------------+ Left Basilic     Diameter (cm)Depth (cm)   Findings    +-----------------+-------------+----------+--------------+ Prox upper arm       0.45        1.65                  +-----------------+-------------+----------+--------------+ Mid upper arm        0.34        1.39                  +-----------------+-------------+----------+--------------+ Dist upper arm       0.35        1.18                  +-----------------+-------------+----------+--------------+ Antecubital fossa    0.27        0.65                  +-----------------+-------------+----------+--------------+ Prox forearm         0.23        0.63     branching    +-----------------+-------------+----------+--------------+ Mid forearm                             not visualized +-----------------+-------------+----------+--------------+ Distal forearm                          not visualized +-----------------+-------------+----------+--------------+ Elbow  not visualized +-----------------+-------------+----------+--------------+ *See table(s) above for measurements and observations.  Diagnosing physician: Monica Martinez MD Electronically signed by Monica Martinez MD on 11/04/2020 at 1:54:18 PM.    Final    ECHOCARDIOGRAM LIMITED  Result Date: 11/02/2020    ECHOCARDIOGRAM REPORT   Patient Name:   Wang Granada. Date of Exam: 11/02/2020 Medical Rec #:  [id number]                Height:       70.5 in Accession #:    [id number]               Weight:       179.9 lb Date of Birth:  19-Mar-1961                BSA:          2.006 m Patient Age:    41 years                 BP:           00/00 mmHg  Patient Gender: M                        HR:           84 bpm. Exam Location:  Inpatient Procedure: Limited Echo and Color Doppler Indications:    LVAD evaluation  History:        Patient has prior history of Echocardiogram examinations, most                 recent 10/26/2020. CHF and Cardiomyopathy, CAD, Prior CABG,                 Arrythmias:Atrial Fibrillation and Atrial Flutter; Risk                 Factors:Diabetes. ESRD.  Sonographer:    Dustin Flock Referring Phys: [place] Comments: RAMP study IMPRESSIONS  1. RV size seemed to decrease at LVAD RPM 5200. Left ventricular ejection fraction, by estimation, is 20 to 25%. The left ventricle has severely decreased function. The left ventricle demonstrates global hypokinesis. The left ventricular internal cavity  size was mildly dilated. There is moderate left ventricular hypertrophy. Left ventricular diastolic function could not be evaluated.  2. Right ventricular systolic function is mildly reduced. The right ventricular size is severely enlarged.  3. The mitral valve was not well visualized. Trivial mitral valve regurgitation.  4. The aortic valve was not well visualized. Aortic valve regurgitation is mild. Mild aortic valve sclerosis is present, with no evidence of aortic valve stenosis.  5. Pulmonic valve regurgitation not assessed. FINDINGS  Left Ventricle: RV size seemed to decrease at LVAD RPM 5200. Left ventricular ejection fraction, by estimation, is 20 to 25%. The left ventricle has severely decreased function. The left ventricle demonstrates global hypokinesis. The left ventricular internal cavity size was mildly dilated. There is moderate left ventricular hypertrophy. Left ventricular diastolic function could not be evaluated. Right Ventricle: The right ventricular size is severely enlarged. Right vetricular wall thickness was not assessed. Right ventricular systolic function is mildly reduced. Left Atrium: Left atrial size  was not assessed. Right Atrium: Right atrial size was not assessed. Pericardium: There is no evidence of pericardial effusion. Mitral Valve: The mitral valve was not well visualized. Trivial mitral valve regurgitation. Tricuspid Valve: The tricuspid valve is not well visualized. Tricuspid valve regurgitation is mild. Aortic Valve: The aortic  valve was not well visualized. Aortic valve regurgitation is mild. Mild aortic valve sclerosis is present, with no evidence of aortic valve stenosis. Pulmonic Valve: The pulmonic valve was not assessed. Pulmonic valve regurgitation not assessed. Aorta: The aortic root was not well visualized. IAS/Shunts: The interatrial septum was not assessed. Additional Comments: A device lead is visualized.  LEFT VENTRICLE PLAX 2D LVIDd:         3.45 cm  Jenkins Rouge MD Electronically signed by Jenkins Rouge MD Signature Date/Time: 11/02/2020/2:13:00 PM    Final    ECHOCARDIOGRAM LIMITED  Result Date: 10/17/2020    ECHOCARDIOGRAM LIMITED REPORT   Patient Name:   Marcus Johnson. Date of Exam: 10/17/2020 Medical Rec #:  [id number]                Height:       70.5 in Accession #:    [id number]               Weight:       212.7 lb Date of Birth:  12/10/1960                BSA:          2.154 m Patient Age:    25 years                 BP:           0/0 mmHg Patient Gender: M                        HR:           97 bpm. Exam Location:  Inpatient Procedure: Limited Echo, Limited Color Doppler and Cardiac Doppler Indications:    Congestive Heart Failure I50.9  History:        Patient has prior history of Echocardiogram examinations, most                 recent 10/13/2020. CHF and Cardiomyopathy, Previous Myocardial                 Infarction and CAD, Arrythmias:Atrial Fibrillation; Risk                 Factors:Diabetes and Hypertension.  Sonographer:    Bernadene Person RDCS Referring Phys: [id number] AMY D CLEGG  Sonographer Comments: Dr. Haroldine Laws present. IMPRESSIONS  1. Technically difficult  echo.  2. The LV endocardium is not well visualized. There appears to be severe LV dysfunction . Marland Kitchen Left ventricular ejection fraction, by estimation, is 20 to 25%. The left ventricle has severely decreased function.  3. The right ventricular size is moderately enlarged.  4. Tricuspid valve regurgitation is mild to moderate. FINDINGS  Left Ventricle: The LV endocardium is not well visualized. There appears to be severe LV dysfunction. Left ventricular ejection fraction, by estimation, is 20 to 25%. The left ventricle has severely decreased function. Right Ventricle: The right ventricular size is moderately enlarged. Tricuspid Valve: The tricuspid valve is grossly normal. Tricuspid valve regurgitation is mild to moderate. Pulmonic Valve: The pulmonic valve was grossly normal. Pulmonic valve regurgitation is trivial. Additional Comments: Technically difficult echo. A device lead is visualized. LEFT VENTRICLE PLAX 2D LVIDd:         3.00 cm  PULMONIC VALVE RVOT Peak grad: 3 mmHg  TRICUSPID VALVE TR Peak grad:   31.4 mmHg TR Vmax:        280.00 cm/s  SHUNTS Pulmonic VTI: 0.089 m  Mertie Moores MD Electronically signed by Mertie Moores MD Signature Date/Time: 10/17/2020/12:02:03 PM    Final    ECHOCARDIOGRAM LIMITED  Result Date: 10/10/2020    ECHOCARDIOGRAM LIMITED REPORT   Patient Name:   Marcus Johnson. Date of Exam: 10/10/2020 Medical Rec #:  [id number]                Height:       70.5 in Accession #:    [id number]               Weight:       201.5 lb Date of Birth:  1960-12-22                BSA:          2.105 m Patient Age:    28 years                 BP:           123/105 mmHg Patient Gender: M                        HR:           93 bpm. Exam Location:  Inpatient Procedure: Limited Echo Indications:    CHF  History:        Patient has prior history of Echocardiogram examinations, most                 recent 10/05/2020. Cardiomyopathy and CHF, CAD, Prior CABG,                 Arrythmias:Atrial Flutter; Risk  Factors:Diabetes.  Sonographer:    Dustin Flock Referring Phys: [place] Comments: Impella position only  FINDINGS  Aortic Valve: An impella 5.5 is visualized and measures 5.2cm from the aortic annulus. Gwyndolyn Kaufman MD Electronically signed by Gwyndolyn Kaufman MD Signature Date/Time: 10/10/2020/12:55:34 PM    Final    [place] EKG SITE RITE  Result Date: 10/17/2020 If Site Rite image not attached, placement could not be confirmed due to current cardiac rhythm.   Labs:  CBC: Recent Labs    10/30/20 0404 10/31/20 0420 11/01/20 0521 11/01/20 2003  WBC 9.8 11.3* 11.0* 12.2*  HGB 8.9* 8.3* 8.4* 8.3*  HCT 29.6* 27.2* 27.1* 27.3*  PLT 278 277 279 301    COAGS: Recent Labs    10/12/20 0648 10/12/20 1826 10/13/20 1651 10/13/20 2112 10/14/20 0254 10/22/20 0310 10/23/20 0454 11/01/20 0513 11/02/20 0517 11/03/20 0540 11/04/20 0334  INR  --    < > 1.6* 1.5*   < > 1.6*   < > 1.7* 1.9* 2.4* 2.8*  APTT 60*  --  63* 38*  --  44*  --   --   --   --   --    < > = values in this interval not displayed.    BMP: Recent Labs    02/01/20 1025 04/06/20 1050 04/18/20 0911 06/06/20 0935 06/23/20 1027 08/29/20 0427 11/01/20 0513 11/02/20 0517 11/03/20 0540 11/04/20 0334  NA 138 140 141   < > 140   < > 134* 132* 132* 135  K 3.5 4.3 4.1   < > 3.8   < > 4.1 4.7 5.2* 4.3  CL 97* 100 107   < > 99   < > 98 95* 95* 98  CO2 _0 < > 22   < > 28 26 25  27  GLUCOSE 276* 93 175*   < > 171*   < > 86 207* 154* 125*  BUN 52* 42* 32*   < > 46*   < > 35* 63* 87* 50*  CALCIUM 10.0 10.0 8.8*   < > 9.5   < > 8.7* 9.0 9.2 8.8*  CREATININE 1.76* 1.80* 1.74*   < > 2.05*   < > 3.11* 4.93* 6.37* 4.32*  GFRNONAA 41* 40* 42*   < > 34*   < > 22* 13* 9* 15*  GFRAA 48* 47* 49*  --  40*  --   --   --   --   --    < > = values in this interval not displayed.    LIVER FUNCTION TESTS: Recent Labs    10/12/20 1826 10/14/20 0254 10/15/20 0410 10/16/20 0435 10/18/20 1538  11/01/20 0513 11/02/20 0517 11/03/20 0540 11/04/20 0334  BILITOT 1.3* 3.5* 3.5* 3.1*  --   --   --   --   --   AST 31 138* 188* 145*  --   --   --   --   --   ALT 17 29 38 51*  --   --   --   --   --   ALKPHOS 136* 56 71 82  --   --   --   --   --   PROT 6.7 5.5* 6.0* 6.2*  --   --   --   --   --   ALBUMIN 2.5* 3.1* 2.6* 2.4*   < > 2.3* 2.4* 2.4* 2.9*   < > = values in this interval not displayed.    Assessment and Plan:  60 y.o. male, inpatient. History of CAD s/p CABG on 2002, AKI, HTN, COVID + January 2022, a fib/a flutter ischemic cardiomyopathy. Several recent admission for CHF. Presented to the ED at [facility] with flu like symptoms, nausea and vomiting with associated lightlessness. Found to be in acute on chronic systolic heart failure s/p on a LVAD on 3.10.22. Critical care placed a temp cath  on 3.15.22. Team is requesting a tunneled HD catheter for on - going dialysis access.   Chest xray from 3.24.22 shows LVAD and RIJ temp catheter. INR is 2.8. BUN 50, Cr 4.32. No pertinent allergies.  IR consulted for possible tunneled HD catheter placement. Case has been reviewed and procedure approved by Dr. Laurence Ferrari.  Patient tentatively scheduled for 4.4.22.  Team instructed to: Keep Patient to be NPO after midnight  IR will call patient when ready.  Risks and benefits discussed with the patient including, but not limited to bleeding, infection, vascular injury, pneumothorax which may require chest tube placement, air embolism or even death  All of the patient's questions were answered, patient is agreeable to proceed. Consent signed and in chart.   Thank you for this interesting consult.  I greatly enjoyed meeting Pablo Stauffer. and look forward to participating in their care.  A copy of this report was sent to the requesting provider on this date.  Electronically Signed: Jacqualine Mau, NP 11/04/2020, 1:57 PM   I spent a total of 40 Minutes    in face to face in  clinical consultation, greater than 50% of which was counseling/coordinating care for tunneled HD catheter placement

## 2020-11-04 NOTE — Progress Notes (Signed)
[facility] PROGRESS NOTE  Assessment/ Plan:  Acute kidney injury on chronic kidney disease stage III: With underlying chronic kidney disease likely from diabetes/hypertension and worsening renal function from hemodynamically mediated ATN and cardiorenal syndrome. CRRT 3/15-3/22 -Unfortunately, no signs of renal recovery at the moment  -CRRT restarted 3/24 - 3/27. Tolerated UF 1.5L with HD 3/28, 3L DUF 3/29 -Plan next HD tomorrow -- will need to make sure can tolerate adequate UF with HD to maintain euvolemia.   - Outpt CLIP underway, needs TDC and eval for perm access prior to d/c.  It's going to be a long road with his LVAD, deconditioning but he wishes to try. Will order vein mapping. IR consult re: TDC placed, nonurgent.  -strict I/O  Hyponatremia -hypervolemic. Pain is also definitely a contributing factor. Pain ctrl per primary service. Na being managed with renal replacement therapy  Acute exacerbation of chronic systolic heart failure with cardiogenic shock: LVAD, placed 3/10. Volume status/UF managed with RRT.   Per above UF to euvolemia.  Anemia:Likely secondary to chronic disease and exacerbated by recent surgical losses. Cont ESA qMon.   Transfuse PRBC as needed.  Iron deficient, being repleted IV.  Atrial fibrillation: per cardiology  Protein calorie malnutrition, hypoalbuminemia: push protein, per primary  Pressure ulcers per primary, see above for pain control  Jannifer Hick MD [facility] Pager [phone]  Subjective: HD yesterday UF only 0.25L with BP low.  He has some vertigo this AM.  No new issues.   Objective Vital signs in last 24 hours: Vitals:   11/04/20 0354 11/04/20 0600 11/04/20 0648 11/04/20 0800  BP: (!) 121/105 94/79    Pulse: 79   79  Resp: 17     Temp: 98.5 F (36.9 C)     TempSrc: Oral     SpO2: 98%   98%  Weight:   84.1 kg   Height:       Weight change: -0.8 kg  Intake/Output Summary (Last 24  hours) at 11/04/2020 1037 Last data filed at 11/03/2020 1700 Gross per 24 hour  Intake --  Output -240 ml  Net 240 ml       Labs: Basic Metabolic Panel: Recent Labs  Lab 11/02/20 0517 11/03/20 0540 11/04/20 0334  NA 132* 132* 135  K 4.7 5.2* 4.3  CL 95* 95* 98  CO2 26 25 27   GLUCOSE 207* 154* 125*  BUN 63* 87* 50*  CREATININE 4.93* 6.37* 4.32*  CALCIUM 9.0 9.2 8.8*  PHOS 3.0 4.8* 3.9   Liver Function Tests: Recent Labs  Lab 11/02/20 0517 11/03/20 0540 11/04/20 0334  ALBUMIN 2.4* 2.4* 2.9*   No results for input(s): LIPASE, AMYLASE in the last 168 hours. No results for input(s): AMMONIA in the last 168 hours. CBC: Recent Labs  Lab 10/29/20 0430 10/30/20 0404 10/31/20 0420 11/01/20 0521 11/01/20 2003  WBC 12.3* 9.8 11.3* 11.0* 12.2*  HGB 9.3* 8.9* 8.3* 8.4* 8.3*  HCT 30.9* 29.6* 27.2* 27.1* 27.3*  MCV 90.9 91.9 90.7 90.3 90.4  PLT 329 278 277 279 301   Cardiac Enzymes: No results for input(s): CKTOTAL, CKMB, CKMBINDEX, TROPONINI in the last 168 hours. CBG: Recent Labs  Lab 11/02/20 2140 11/03/20 0713 11/03/20 1101 11/03/20 2155 11/04/20 0646  GLUCAP 206* 153* 186* 135* 99    Iron Studies:  Recent Labs    11/03/20 0540  IRON 40*  TIBC 344  FERRITIN 158   Studies/Results: ECHOCARDIOGRAM LIMITED  Result Date: 11/02/2020    ECHOCARDIOGRAM  REPORT   Patient Name:   Marcus Johnson. Date of Exam: 11/02/2020 Medical Rec #:  [id number]                Height:       70.5 in Accession #:    [id number]               Weight:       179.9 lb Date of Birth:  [birth year]                BSA:          2.006 m Patient Age:    60 years                 BP:           00/00 mmHg Patient Gender: M                        HR:           84 bpm. Exam Location:  Inpatient Procedure: Limited Echo and Color Doppler Indications:    LVAD evaluation  History:        Patient has prior history of Echocardiogram examinations, most                 recent 10/26/2020. CHF and  Cardiomyopathy, CAD, Prior CABG,                 Arrythmias:Atrial Fibrillation and Atrial Flutter; Risk                 Factors:Diabetes. ESRD.  Sonographer:    Dustin Flock Referring Phys: [place] Comments: RAMP study IMPRESSIONS  1. RV size seemed to decrease at LVAD RPM 5200. Left ventricular ejection fraction, by estimation, is 20 to 25%. The left ventricle has severely decreased function. The left ventricle demonstrates global hypokinesis. The left ventricular internal cavity  size was mildly dilated. There is moderate left ventricular hypertrophy. Left ventricular diastolic function could not be evaluated.  2. Right ventricular systolic function is mildly reduced. The right ventricular size is severely enlarged.  3. The mitral valve was not well visualized. Trivial mitral valve regurgitation.  4. The aortic valve was not well visualized. Aortic valve regurgitation is mild. Mild aortic valve sclerosis is present, with no evidence of aortic valve stenosis.  5. Pulmonic valve regurgitation not assessed. FINDINGS  Left Ventricle: RV size seemed to decrease at LVAD RPM 5200. Left ventricular ejection fraction, by estimation, is 20 to 25%. The left ventricle has severely decreased function. The left ventricle demonstrates global hypokinesis. The left ventricular internal cavity size was mildly dilated. There is moderate left ventricular hypertrophy. Left ventricular diastolic function could not be evaluated. Right Ventricle: The right ventricular size is severely enlarged. Right vetricular wall thickness was not assessed. Right ventricular systolic function is mildly reduced. Left Atrium: Left atrial size was not assessed. Right Atrium: Right atrial size was not assessed. Pericardium: There is no evidence of pericardial effusion. Mitral Valve: The mitral valve was not well visualized. Trivial mitral valve regurgitation. Tricuspid Valve: The tricuspid valve is not well visualized.  Tricuspid valve regurgitation is mild. Aortic Valve: The aortic valve was not well visualized. Aortic valve regurgitation is mild. Mild aortic valve sclerosis is present, with no evidence of aortic valve stenosis. Pulmonic Valve: The pulmonic valve was not assessed. Pulmonic valve regurgitation not assessed. Aorta: The aortic root was not well visualized.  IAS/Shunts: The interatrial septum was not assessed. Additional Comments: A device lead is visualized.  LEFT VENTRICLE PLAX 2D LVIDd:         3.45 cm  Jenkins Rouge MD Electronically signed by Jenkins Rouge MD Signature Date/Time: 11/02/2020/2:13:00 PM    Final     Medications: Infusions: . sodium chloride    . sodium chloride    . [START ON 11/05/2020] ferric gluconate (FERRLECIT/NULECIT) IV    . ferric gluconate (FERRLECIT/NULECIT) Test Dose    . lactated ringers Stopped (10/17/20 1655)    Scheduled Medications: . (feeding supplement) PROSource Plus  30 mL Oral BID BM  . amiodarone  200 mg Oral BID  . aspirin EC  81 mg Oral Daily  . Chlorhexidine Gluconate Cloth  6 each Topical Daily  . Chlorhexidine Gluconate Cloth  6 each Topical Q0600  . collagenase   Topical Daily  . feeding supplement  237 mL Oral TID BM  . gabapentin  100 mg Oral QHS  . insulin aspart  0-20 Units Subcutaneous TID WC  . insulin aspart  0-5 Units Subcutaneous QHS  . insulin glargine  20 Units Subcutaneous BID  . midodrine  15 mg Oral TID WC  . multivitamin  1 tablet Oral QHS  . pantoprazole  40 mg Oral Daily  . polyethylene glycol  17 g Oral Daily  . sildenafil  20 mg Oral TID  . sodium chloride flush  10-40 mL Intracatheter Q12H  . warfarin  2 mg Oral ONCE-1600  . Warfarin - Pharmacist Dosing Inpatient   Does not apply q1600    have reviewed scheduled and prn medications.  Physical Exam: General: NAD, lying in bed comfortably Heart: Mechanical heart sound/lvad hum Lungs: cta bl Abdomen:soft, Non-tender, non-distended Extremities: no edema bl Dialysis  Access: Temporary RIJ HD catheter.  Ria Comment A Kysha Muralles 11/04/2020,10:37 AM  LOS: 37 days

## 2020-11-04 NOTE — Progress Notes (Signed)
CARDIAC REHAB PHASE I   PRE:  Rate/Rhythm: 81   BP:  Sitting: 75/64 (70) automatic; (76) doppler      SaO2: 95 RA  MODE:  Ambulation: 75 ft x2   POST:  Rate/Rhythm: 86   BP:  Sitting: 100/89 (94)    SaO2: 94 RA   After some encouragement, pt agreeable to ambulate. Pt ambulated ~20ft in hallway and needed a sitting rest break due to sacral pain. Pt took a seated rest break and was able to ambulate back to room. Vitals stable. Upon helping pt to lay in bed, pt became nauseated and vomited 300cc. RN at bedside administering medication. Encouraged rest. Will f/u and encourage ambulation as pts schedule allows.  [id number] Rufina Falco, RN BSN 11/04/2020 2:06 PM

## 2020-11-04 NOTE — Progress Notes (Signed)
ANTICOAGULATION CONSULT NOTE - Follow Up Consult  Pharmacy Consult for Warfarin Indication: LVAD  Allergies  Allergen Reactions  . Meclizine Hcl Anaphylaxis and Swelling  . Ivabradine Nausea Only    Patient Measurements: Height: 5' 10.5" (179.1 cm) Weight: 84.1 kg (185 lb 6.5 oz) IBW/kg (Calculated) : 74.15 Heparin Dosing Weight: 89.2 kg  Vital Signs: Temp: 98.5 F (36.9 C) (04/01 0354) Temp Source: Oral (04/01 0354) BP: 94/79 (04/01 0600) Pulse Rate: 79 (04/01 0800)  Labs: Recent Labs    11/01/20 2003 11/02/20 0517 11/03/20 0540 11/04/20 0334  HGB 8.3*  --   --   --   HCT 27.3*  --   --   --   PLT 301  --   --   --   LABPROT  --  21.3* 25.0* 28.4*  INR  --  1.9* 2.4* 2.8*  CREATININE  --  4.93* 6.37* 4.32*    Estimated Creatinine Clearance: 19.3 mL/min (A) (by C-G formula based on SCr of 4.32 mg/dL (H)).   Medical History: Past Medical History:  Diagnosis Date  . AICD (automatic cardioverter/defibrillator) present    Single-chamber  implantable cardiac defibrillator - Medtronic  . Atrial fibrillation ([place])   . Cataract    Mixed form OU  . CHF (congestive heart failure) ([place])   . Chronic kidney disease   . Chronic kidney disease (CKD), stage III (moderate) (HCC)   . Chronic systolic heart failure ([place])    a. Echo 5/13: Mild LVE, mild LVH, EF 10%, anteroseptal, lateral, apical AK, mild MR, mild LAE, moderately reduced RVSF, mild RAE, PASP 60;  b. 07/2014 Echo: EF 20-2%, diff HK, AKI of antsep/apical/mid-apicalinferior, mod reduced RV.  Marland Kitchen Coronary artery disease    a. s/p CABG 2002 b. LHC 5/13:  dLM 80%, LAD subtotally occluded, pCFX occluded, pRCA 50%, mid? Occlusion with high take off of the PDA with 70% multiple lesions-not bypassed and supplies collaterals to LAD, LIMA-IM/ramus ok, S-OM ok, S-PLA branches ok. Medical therapy was recommended  . COVID   . Diabetic retinopathy (HCC)    NPDR OD, PDR OS  . Dyspnea   . Gout    "on daily RX" (01/08/2018)  .  Hypertension   . Hypertensive retinopathy    OU  . Ischemic cardiomyopathy    a. Echo 5/13: Mild LVE, mild LVH, EF 10%, anteroseptal, lateral, apical AK, mild MR, mild LAE, moderately reduced RVSF, mild RAE, PASP 60;  b. 01/2012 s/p MDT [id number] Protecta XT VR AICD;  c. 07/2014 Echo: EF 20-2%, diff HK, AKI of antsep/apical/mid-apicalinferior, mod reduced RV.  Marland Kitchen MRSA (methicillin resistant Staphylococcus aureus)    Status post right foot plantar deep infection with MRSA status post  I&D 10/2008  . Myocardial infarction [facility])    "was told I'd had several before heart OR in 2002" (01/08/2018)  . Noncompliance   . Peripheral neuropathy   . Retinopathy, diabetic, background ([place])   . Syncope   . Type II diabetes mellitus ([place])    requiring insulin   . Vitreous hemorrhage, left (HCC)    and proliferative diabetic retinopathy    Assessment: 60 yo M s/p new LVAD implant with HeartMate III on 3/10. Warfarin started per MD on 3/12, and pharmacy asked to take over warfarin dosing.   INR above goal at 2.8 today. Currently taking 2-3 Ensures daily, eating well. Hgb stable. No overt bleeding. LDH stable 200s  Goal of Therapy:  INR 2-2.5 Monitor platelets by anticoagulation protocol: Yes   Plan:  Warfarin 2 mg PO tonight  Daily PT/INR, CBC, monitor s/sx bleeding  [place] Crane, Vena Austria, BCPS, [facility] Clinical Pharmacist  11/04/2020 9:38 AM   [facility] pharmacy phone numbers are listed on [email or web address]

## 2020-11-04 NOTE — Progress Notes (Signed)
Vomited to about 300 ml emesis after a walk with cardiac rehab. Zosyn given.

## 2020-11-04 NOTE — Progress Notes (Signed)
Felt dizzy after a walk with PT. MD aware.

## 2020-11-04 NOTE — Progress Notes (Signed)
Upper extremity vein mapping has been completed.   Preliminary results in CV Proc.   Marcus Johnson 11/04/2020 11:11 AM

## 2020-11-04 NOTE — Progress Notes (Signed)
Occupational Therapy Treatment Patient Details Name: Marcus Johnson. MRN: [id number] DOB: 02-Jul-1961 Today's Date: 11/04/2020    History of present illness 60 y.o. male admitted 2/28 with ICM for LVAD workup. Impella placed 3/1. Heartmate 3 LVAD placed 3/10. CRRT started 3/15. CRRT held 3/23-3/24 then restarted until 3/27 with iHD started 3/28. PMHx: DM2, CAD s/p CABG 5885, systolic HF due to ischemic cardiomyopathy with EF 20-25% (echo 12/15), DM2 and CKD. He is s/p Medtronic single chamber ICD. Covid 1/22.   OT comments  Session limited today due to n/v. Session focused on bed level ADL and RUE exercise due to c/o nausea. Thumb loop and buddy strap positioned for pt to work on grip/pinch strengthening when feeling better. Educated pt on the need to advocate for himself during his hospitalization. Pt appears to be having a difficult time emotionally given his recent set backs with recovery. Pt continues to try to participate with therapy and is an excellent candidate for CIR. Will continue to follow acutely.   Follow Up Recommendations  CIR;Supervision/Assistance - 24 hour    Equipment Recommendations  3 in 1 bedside commode    Recommendations for Other Services Rehab consult    Precautions / Restrictions Precautions Precautions: Fall;Sternal;Other (comment) Precaution Comments: LVAD, sacral wound; R IJ dialysis catheter, dizziness  Required Braces or Orthoses: Other Brace Other Brace: thumb loop and buddy strap R hand          Balance                                           ADL either performed or assessed with clinical judgement   ADL       Grooming: Set up;Supervision/safety;Bed level   Upper Body Bathing: Set up;Supervision/ safety;Bed level   Lower Body Bathing: Moderate assistance;Bed level   Upper Body Dressing : Supervision/safety;Set up;Bed level Upper Body Dressing Details (indicate cue type and reason): using B hand. L hand to reach  over head                   General ADL Comments: ADL at bed level to clean up after pt vomited large amount. Educated pt on need to increase his participation with bathing/ADL tasks     Vision       Perception     Praxis      Cognition Arousal/Alertness: Awake/alert Behavior During Therapy: WFL for tasks assessed/performed Overall Cognitive Status: Impaired/Different from baseline Area of Impairment: Attention;Awareness;Problem solving                   Current Attention Level: Selective Memory: Decreased short-term memory Following Commands: Follows one step commands consistently   Awareness: Emergent Problem Solving: Slow processing General Comments: pt recalling all sternal precautions appropriately        Exercises General Exercises - Upper Extremity Shoulder Flexion: Right;10 reps;Sidelying;AROM;AAROM;PROM Elbow Extension: AAROM;AROM;PROM;Right;10 reps;Sidelying (@ 30 degree extensor lag however able to achieve full PROM with extension) Wrist Flexion: AAROM;AROM;Right;10 reps;Sidelying Digit Composite Flexion: AROM;AAROM;PROM;Right;10 reps;Sidelying Other Exercises Other Exercises: pinch/grip strengthening x 10 Other Exercises: scapular depression against minimal resistance x 10 in L sidelying Other Exercises: external rotation A/AAROM - able to achieve neutral position   Shoulder Instructions       General Comments Pt states" nobody knows how to put that thing on my hand although you have picutres and have told everybody". Focus  on having pt educate staff on how to donn strapping system in order for pt to advocate for himself.    Pertinent Vitals/ Pain       Pain Assessment: Faces Faces Pain Scale: Hurts little more Pain Location: sacrum Pain Descriptors / Indicators: Constant;Discomfort;Grimacing;Guarding Pain Intervention(s): Limited activity within patient's tolerance  Home Living                                           Prior Functioning/Environment              Frequency  Min 3X/week        Progress Toward Goals  OT Goals(current goals can now be found in the care plan section)  Progress towards OT goals: Progressing toward goals  Acute Rehab OT Goals Patient Stated Goal: to go home and be with his grandchildren OT Goal Formulation: With patient Time For Goal Achievement: 11/14/20 Potential to Achieve Goals: Good ADL Goals Pt Will Perform Eating: with set-up;with adaptive utensils Pt Will Perform Grooming: with set-up;sitting Pt Will Transfer to Toilet: with supervision;bedside commode;ambulating Pt/caregiver will Perform Home Exercise Program: Increased ROM;Increased strength;Both right and left upper extremity;With minimal assist;With written HEP provided Additional ADL Goal #1: Pt will be able to recall sternal precautions Additional ADL Goal #2: Pt will be able to change out his lines from battery<>wall with min A Additional ADL Goal #3: Pt will demonstrate functional pinch pattern to pick up 10 objects with no drops using thumb loop and buddy strap R hand  Plan Discharge plan remains appropriate;Frequency needs to be updated    Co-evaluation                 AM-PAC OT "6 Clicks" Daily Activity     Outcome Measure   Help from another person eating meals?: A Little Help from another person taking care of personal grooming?: A Little Help from another person toileting, which includes using toliet, bedpan, or urinal?: A Lot Help from another person bathing (including washing, rinsing, drying)?: A Lot Help from another person to put on and taking off regular upper body clothing?: A Little Help from another person to put on and taking off regular lower body clothing?: A Lot 6 Click Score: 15    End of Session    OT Visit Diagnosis: Unsteadiness on feet (R26.81);Other abnormalities of gait and mobility (R26.89);Muscle weakness (generalized) (M62.81);Other symptoms and signs  involving cognitive function;Dizziness and giddiness (R42);Pain Pain - Right/Left: Right Pain - part of body:  (sacrum)   Activity Tolerance Patient limited by fatigue;Other (comment) (n/v)   Patient Left in bed;with call bell/phone within reach;with bed alarm set (L sidelying)   Nurse Communication Mobility status;Other (comment) (use of R thumb and buddy strap)        Time: 1410-1440 OT Time Calculation (min): 30 min  Charges: OT General Charges $OT Visit: 1 Visit OT Treatments $Self Care/Home Management : 8-22 mins $Neuromuscular Re-education: 8-22 mins  Maurie Boettcher, OT/L   Acute OT Clinical Specialist Acute Rehabilitation Services Pager [phone] Office [phone]    [facility] 11/04/2020, 3:50 PM

## 2020-11-04 NOTE — Consult Note (Signed)
[place] Nurse wound follow up Patient receiving care in [facility] 2C7. PT has completed hydrotherapy to the sacral wound. I have updated the wound care orders to twice daily saline dampened gauze and ABD pads for bedside nurses to perform.  WOC will see each week. Val Riles, RN, MSN, CWOCN, CNS-BC, pager [phone]

## 2020-11-04 NOTE — Plan of Care (Signed)
  Problem: Education: Goal: Knowledge of General Education information will improve Description: Including pain rating scale, medication(s)/side effects and non-pharmacologic comfort measures Outcome: Progressing   Problem: Health Behavior/Discharge Planning: Goal: Ability to manage health-related needs will improve Outcome: Progressing   Problem: Clinical Measurements: Goal: Ability to maintain clinical measurements within normal limits will improve Outcome: Progressing Goal: Will remain free from infection Outcome: Progressing Goal: Diagnostic test results will improve Outcome: Progressing Goal: Cardiovascular complication will be avoided Outcome: Progressing   Problem: Activity: Goal: Risk for activity intolerance will decrease Outcome: Progressing   Problem: Nutrition: Goal: Adequate nutrition will be maintained Outcome: Progressing   Problem: Coping: Goal: Level of anxiety will decrease Outcome: Progressing   Problem: Elimination: Goal: Will not experience complications related to bowel motility Outcome: Progressing   Problem: Pain Managment: Goal: General experience of comfort will improve Outcome: Progressing   Problem: Safety: Goal: Ability to remain free from injury will improve Outcome: Progressing   Problem: Skin Integrity: Goal: Risk for impaired skin integrity will decrease Outcome: Progressing   Problem: Education: Goal: Ability to demonstrate management of disease process will improve Outcome: Progressing Goal: Ability to verbalize understanding of medication therapies will improve Outcome: Progressing Goal: Individualized Educational Video(s) Outcome: Progressing   Problem: Activity: Goal: Capacity to carry out activities will improve Outcome: Progressing   Problem: Cardiac: Goal: Ability to achieve and maintain adequate cardiopulmonary perfusion will improve Outcome: Progressing   Problem: Education: Goal: Knowledge of disease or  condition will improve Outcome: Progressing Goal: Understanding of medication regimen will improve Outcome: Progressing Goal: Individualized Educational Video(s) Outcome: Progressing   Problem: Activity: Goal: Ability to tolerate increased activity will improve Outcome: Progressing   Problem: Cardiac: Goal: Ability to achieve and maintain adequate cardiopulmonary perfusion will improve Outcome: Progressing   Problem: Health Behavior/Discharge Planning: Goal: Ability to safely manage health-related needs after discharge will improve Outcome: Progressing   Problem: Education: Goal: Understanding of CV disease, CV risk reduction, and recovery process will improve Outcome: Progressing Goal: Individualized Educational Video(s) Outcome: Progressing   Problem: Activity: Goal: Ability to return to baseline activity level will improve Outcome: Progressing   Problem: Cardiovascular: Goal: Ability to achieve and maintain adequate cardiovascular perfusion will improve Outcome: Progressing Goal: Vascular access site(s) Level 0-1 will be maintained Outcome: Progressing   Problem: Health Behavior/Discharge Planning: Goal: Ability to safely manage health-related needs after discharge will improve Outcome: Progressing   Problem: Education: Goal: Knowledge of disease and its progression will improve Outcome: Progressing   Problem: Health Behavior/Discharge Planning: Goal: Ability to manage health-related needs will improve Outcome: Progressing   Problem: Clinical Measurements: Goal: Complications related to the disease process or treatment will be avoided or minimized Outcome: Progressing Goal: Dialysis access will remain free of complications Outcome: Progressing   Problem: Activity: Goal: Activity intolerance will improve Outcome: Progressing   Problem: Fluid Volume: Goal: Fluid volume balance will be maintained or improved Outcome: Progressing   Problem:  Nutritional: Goal: Ability to make appropriate dietary choices will improve Outcome: Progressing   Problem: Respiratory: Goal: Respiratory symptoms related to disease process will be avoided Outcome: Progressing   Problem: Self-Concept: Goal: Body image disturbance will be avoided or minimized Outcome: Progressing   Problem: Urinary Elimination: Goal: Progression of disease will be identified and treated Outcome: Progressing

## 2020-11-05 DIAGNOSIS — I5023 Acute on chronic systolic (congestive) heart failure: Secondary | ICD-10-CM | POA: Diagnosis not present

## 2020-11-05 DIAGNOSIS — Z95811 Presence of heart assist device: Secondary | ICD-10-CM | POA: Diagnosis not present

## 2020-11-05 LAB — RENAL FUNCTION PANEL
Albumin: 2.8 g/dL — ABNORMAL LOW (ref 3.5–5.0)
Anion gap: 13 (ref 5–15)
BUN: 69 mg/dL — ABNORMAL HIGH (ref 6–20)
CO2: 25 mmol/L (ref 22–32)
Calcium: 9.2 mg/dL (ref 8.9–10.3)
Chloride: 95 mmol/L — ABNORMAL LOW (ref 98–111)
Creatinine, Ser: 5.75 mg/dL — ABNORMAL HIGH (ref 0.61–1.24)
GFR, Estimated: 11 mL/min — ABNORMAL LOW (ref >60)
Glucose, Bld: 70 mg/dL (ref 70–99)
Phosphorus: 6.6 mg/dL — ABNORMAL HIGH (ref 2.5–4.6)
Potassium: 4.9 mmol/L (ref 3.5–5.1)
Sodium: 133 mmol/L — ABNORMAL LOW (ref 135–145)

## 2020-11-05 LAB — GLUCOSE, CAPILLARY
Glucose-Capillary: 67 mg/dL — ABNORMAL LOW (ref 70–99)
Glucose-Capillary: 91 mg/dL (ref 70–99)
Glucose-Capillary: 133 mg/dL — ABNORMAL HIGH (ref 70–99)
Glucose-Capillary: 79 mg/dL (ref 70–99)
Glucose-Capillary: 98 mg/dL (ref 70–99)

## 2020-11-05 LAB — COOXEMETRY PANEL
Carboxyhemoglobin: 2.1 % — ABNORMAL HIGH (ref 0.5–1.5)
Methemoglobin: 0.6 % (ref 0.0–1.5)
O2 Saturation: 48.8 %
Total hemoglobin: 8.9 g/dL — ABNORMAL LOW (ref 12.0–16.0)

## 2020-11-05 LAB — PROTIME-INR
INR: 3 — ABNORMAL HIGH (ref 0.8–1.2)
Prothrombin Time: 30.3 seconds — ABNORMAL HIGH (ref 11.4–15.2)

## 2020-11-05 LAB — LACTATE DEHYDROGENASE: LDH: 216 U/L — ABNORMAL HIGH (ref 98–192)

## 2020-11-05 LAB — MAGNESIUM: Magnesium: 2.2 mg/dL (ref 1.7–2.4)

## 2020-11-05 MED ORDER — AMIODARONE HCL 200 MG PO TABS
200.0000 mg | ORAL_TABLET | Freq: Every day | ORAL | Status: DC
  Administered 2020-11-05 – 2020-11-14 (×10): 200 mg via ORAL
  Filled 2020-11-05 (×9): qty 1

## 2020-11-05 MED ORDER — WARFARIN SODIUM 1 MG PO TABS
1.0000 mg | ORAL_TABLET | Freq: Once | ORAL | Status: AC
  Administered 2020-11-05: 1 mg via ORAL
  Filled 2020-11-05: qty 1

## 2020-11-05 NOTE — Progress Notes (Signed)
Patient ID: Marcus Johnson., male   DOB: 22-Dec-1960, 60 y.o.   MRN: [id number]     Advanced Heart Failure Rounding Note  PCP-Cardiologist: No primary care provider on file.   Subjective:    - 2/27 Milrinone switched to dobutamine 5 mcg.  - 2/28 Moved to ICU. Dobutamine increased to 7.5 mcg and Norepi 3 mcg added. Diuretics held.  - 3/1 Impella 5.5 placed.  - 3/4 Teeth removed - 3/10 HM3 LVAD placed - 3/13 VAD speed increased to 5500. [name] out  - 3/14 Nephrology consulted. Given 1UPRBCs  - 3/15 Started CRRT. Given 1UPRBCs.  - 3/23 VAD Speed turned down to 5300  - 3/23 CRRT and Milrinone Stopped. Back in Afib - 3/24 CRRT restarted.  - 3/27 CVVH off.  -3/28 iHD -3/29 iHD. Walked 75 feet!! -3/20 Ramp Echo: Fixed speed: 5200 Low speed limit: 4900  Remains anuric. On iHD, next today.   INR 3.0   MAP 88 but got dizzy when he stood up this morning.  He took an oxycodone just before standing and thinks this was the culprit.   LVAD Interrogation HM 3: Speed: 5200 Flow: 4.3  PI: 4.4  Power: 3.7.   Objective:   Weight Range: 85.8 kg Body mass index is 26.76 kg/m.   Vital Signs:   Temp:  [98.4 F (36.9 C)-98.7 F (37.1 C)] 98.4 F (36.9 C) (04/02 0453) Pulse Rate:  [77-83] 77 (04/02 0453) Resp:  [12-16] 16 (04/02 0453) BP: (95-108)/(54-90) 101/68 (04/02 0453) SpO2:  [91 %-98 %] 98 % (04/02 0453) Weight:  [85.8 kg] 85.8 kg (04/02 0500) Last BM Date: 10/31/20  Weight change: Filed Weights   11/03/20 1710 11/04/20 0648 11/05/20 0500  Weight: 85.5 kg 84.1 kg 85.8 kg    Intake/Output:   Intake/Output Summary (Last 24 hours) at 11/05/2020 0921 Last data filed at 11/04/2020 1400 Gross per 24 hour  Intake --  Output 300 ml  Net -300 ml      Physical Exam   MAPs 88   CVP not hooked up.   General: Well appearing this am. NAD.  HEENT: Normal. Neck: Supple, JVP 9-10. Carotids OK.  Cardiac:  Mechanical heart sounds with LVAD hum present.  Lungs:  CTAB, normal  effort.  Abdomen:  NT, ND, no HSM. No bruits or masses. +BS  LVAD exit site: Well-healed and incorporated. Dressing dry and intact. No erythema or drainage. Stabilization device present and accurately applied. Driveline dressing changed daily per sterile technique. Extremities:  Warm and dry. No cyanosis, clubbing, rash, or edema.  Neuro:  Alert & oriented x 3. Cranial nerves grossly intact. Moves all 4 extremities w/o difficulty. Affect pleasant    Telemetry   NSR 70s personally reviewed.    Labs    CBC No results for input(s): WBC, NEUTROABS, HGB, HCT, MCV, PLT in the last 72 hours. Basic Metabolic Panel Recent Labs    11/04/20 0334 11/05/20 0500  NA 135 133*  K 4.3 4.9  CL 98 95*  CO2 27 25  GLUCOSE 125* 70  BUN 50* 69*  CREATININE 4.32* 5.75*  CALCIUM 8.8* 9.2  MG 2.2 2.2  PHOS 3.9 6.6*   Liver Function Tests Recent Labs    11/04/20 0334 11/05/20 0500  ALBUMIN 2.9* 2.8*   No results for input(s): LIPASE, AMYLASE in the last 72 hours. Cardiac Enzymes No results for input(s): CKTOTAL, CKMB, CKMBINDEX, TROPONINI in the last 72 hours.  BNP: BNP (last 3 results) Recent Labs  10/20/20 0305 10/27/20 0020 11/02/20 2310  BNP 466.7* 774.9* 486.0*    ProBNP (last 3 results) No results for input(s): PROBNP in the last 8760 hours.   D-Dimer No results for input(s): DDIMER in the last 72 hours. Hemoglobin A1C No results for input(s): HGBA1C in the last 72 hours. Fasting Lipid Panel No results for input(s): CHOL, HDL, LDLCALC, TRIG, CHOLHDL, LDLDIRECT in the last 72 hours. Thyroid Function Tests No results for input(s): TSH, T4TOTAL, T3FREE, THYROIDAB in the last 72 hours.  Invalid input(s): FREET3  Other results:   Imaging    VAS [place] UPPER EXT VEIN MAPPING (PRE-OP AVF)  Result Date: 11/04/2020 UPPER EXTREMITY VEIN MAPPING  Indications: Pre-access. Comparison Study: no prior Performing Technologist: Abram Sander RVS  Examination Guidelines: A complete  evaluation includes B-mode imaging, spectral Doppler, color Doppler, and power Doppler as needed of all accessible portions of each vessel. Bilateral testing is considered an integral part of a complete examination. Limited examinations for reoccurring indications may be performed as noted. +-----------------+-------------+----------+--------------+ Right Cephalic   Diameter (cm)Depth (cm)   Findings    +-----------------+-------------+----------+--------------+ Shoulder             0.18        1.25                  +-----------------+-------------+----------+--------------+ Prox upper arm       0.32        1.17                  +-----------------+-------------+----------+--------------+ Mid upper arm                           not visualized +-----------------+-------------+----------+--------------+ Dist upper arm                          not visualized +-----------------+-------------+----------+--------------+ Antecubital fossa    0.43        0.37                  +-----------------+-------------+----------+--------------+ Prox forearm         0.34        0.57                  +-----------------+-------------+----------+--------------+ Mid forearm          0.18        0.49                  +-----------------+-------------+----------+--------------+ Dist forearm         0.17        0.42                  +-----------------+-------------+----------+--------------+ Wrist                0.14        0.30                  +-----------------+-------------+----------+--------------+ +-----------------+-------------+----------+--------------+ Right Basilic    Diameter (cm)Depth (cm)   Findings    +-----------------+-------------+----------+--------------+ Prox upper arm       0.39        1.49                  +-----------------+-------------+----------+--------------+ Mid upper arm        0.39        1.35                   +-----------------+-------------+----------+--------------+  Dist upper arm       0.41        0.86                  +-----------------+-------------+----------+--------------+ Antecubital fossa    0.32        0.66                  +-----------------+-------------+----------+--------------+ Prox forearm         0.20        0.33     branching    +-----------------+-------------+----------+--------------+ Mid forearm                             not visualized +-----------------+-------------+----------+--------------+ Distal forearm                          not visualized +-----------------+-------------+----------+--------------+ Elbow                                   not visualized +-----------------+-------------+----------+--------------+ Wrist                                   not visualized +-----------------+-------------+----------+--------------+ +-----------------+-------------+----------+--------------+ Left Cephalic    Diameter (cm)Depth (cm)   Findings    +-----------------+-------------+----------+--------------+ Shoulder             0.24        1.20                  +-----------------+-------------+----------+--------------+ Prox upper arm       0.24        1.01                  +-----------------+-------------+----------+--------------+ Mid upper arm        0.34        0.63                  +-----------------+-------------+----------+--------------+ Dist upper arm       0.29        0.71                  +-----------------+-------------+----------+--------------+ Antecubital fossa    0.38        0.46                  +-----------------+-------------+----------+--------------+ Prox forearm         0.37        0.47                  +-----------------+-------------+----------+--------------+ Mid forearm          0.30        0.30     branching    +-----------------+-------------+----------+--------------+ Dist forearm                             not visualized +-----------------+-------------+----------+--------------+ Wrist                                   not visualized +-----------------+-------------+----------+--------------+ +-----------------+-------------+----------+--------------+ Left Basilic     Diameter (cm)Depth (cm)   Findings    +-----------------+-------------+----------+--------------+ Prox upper arm       0.45  1.65                  +-----------------+-------------+----------+--------------+ Mid upper arm        0.34        1.39                  +-----------------+-------------+----------+--------------+ Dist upper arm       0.35        1.18                  +-----------------+-------------+----------+--------------+ Antecubital fossa    0.27        0.65                  +-----------------+-------------+----------+--------------+ Prox forearm         0.23        0.63     branching    +-----------------+-------------+----------+--------------+ Mid forearm                             not visualized +-----------------+-------------+----------+--------------+ Distal forearm                          not visualized +-----------------+-------------+----------+--------------+ Elbow                                   not visualized +-----------------+-------------+----------+--------------+ *See table(s) above for measurements and observations.  Diagnosing physician: Monica Martinez MD Electronically signed by Monica Martinez MD on 11/04/2020 at 1:54:18 PM.    Final      Medications:     Scheduled Medications: . (feeding supplement) PROSource Plus  30 mL Oral BID BM  . amiodarone  200 mg Oral Daily  . aspirin EC  81 mg Oral Daily  . Chlorhexidine Gluconate Cloth  6 each Topical Daily  . Chlorhexidine Gluconate Cloth  6 each Topical Q0600  . feeding supplement  237 mL Oral TID BM  . gabapentin  100 mg Oral QHS  . insulin aspart  0-20 Units Subcutaneous TID WC  .  insulin aspart  0-5 Units Subcutaneous QHS  . insulin glargine  20 Units Subcutaneous BID  . midodrine  15 mg Oral TID WC  . multivitamin  1 tablet Oral QHS  . pantoprazole  40 mg Oral Daily  . polyethylene glycol  17 g Oral Daily  . sildenafil  20 mg Oral TID  . sodium chloride flush  10-40 mL Intracatheter Q12H  . Warfarin - Pharmacist Dosing Inpatient   Does not apply q1600    Infusions: . sodium chloride    . sodium chloride    . [START ON 11/07/2020]  ceFAZolin (ANCEF) IV    . ferric gluconate (FERRLECIT/NULECIT) IV    . lactated ringers Stopped (10/17/20 1655)    PRN Medications: sodium chloride, sodium chloride, acetaminophen, alteplase, dextrose, fentaNYL (SUBLIMAZE) injection, heparin, heparin, lidocaine (PF), lidocaine-prilocaine, ondansetron (ZOFRAN) IV, oxyCODONE, pentafluoroprop-tetrafluoroeth, senna-docusate, sodium chloride flush, traZODone, white petrolatum    Assessment/Plan   1.Acute on chronic systolic HF -> cardiogenic shock:ICM.Has[employer] ICD. EF 15%. Low output persisted despite milrinone and then dobutamine with rise in creatinine to 2.5. Impella 5.5 placed 3/1.  Creatinine improved to 1.7 and HM3 LVAD was placed on 3/10. Developed post-op renal failure requiring CVVH. Now off milrinone and epinephrine. Last echo showed moderate RV enlargement with mildly decreased RV function and VAD speed decreased to  5300 due to left-sided septum. CRRT stopped 3/23 but restarted 3/24. CVVH stopped again on 3/27. Ramp echo 3/30 with speed decreased to 5200.  Tolerating iHD for volume management, next session today.   - Continue midodrine 15 mg tid.  - Continue sildenafil 20 mg tid for RV failure.  2. LVAD: 3/13 VAD speed increased to 5500. 3/23 speed decreased to 5300 and decreased to 5200 on 3/30.  VAD interrogated personally. Parameters stable.  LDH stable at 216.   - INR 3 today on warfarin, on ASA 81 daily.  - DL site ok 3. CAD s/p [id number]:Last cath in 6/19 with  patent grafts.  - Continue Crestor 40 mg daily.  4. AKI on CKD Stage 3b: Suspect that this is a combination of cardiorenal syndrome and diabetic nephropathy and possibly ATN. Developed post-op AKI. Started on CRRT 3/15. CVVH stopped 3/23 but restarted 3/24.  - Tolerating iHD  - Tunneled catheter on Monday.  5. Atrial flutter/fibrillation: s/p DC-CV 08/21/18. Maintaining NSR.     - Continue po amiodarone but decrease to 200 mg daily.  6. Wound R Foot: Partial thickness skin loss noted. Cover with mepilex border and change every 5 days.  7. Right arm pain: Think neuropathic, per Dr. Cyndia Bent patient had stretch of brachial plexus with Impella placement.  8. ID:  WBCs normal. Afebrile. Blood Cx- NGTD.  9. BP:PHKFEXM of very poor control but hgbA1ctrending down. Most recent 7.7%.  Insulin drip stopped 3/24.  Now on lantus. Stop meal coverage.   - Diabetes coordinators.  10. Anemia: Has been stable.  11. Deconditioning - PT/OT following, needs aggressive work.   - CIR consulted 12. Unstageable Pressure Ulcer on buttokcs.  - On hydrotherapy Mon-Sat.  - Continue reposition every 2 hours side lying position. Reposition frequently in the chair.  Loralie Champagne 11/05/2020 9:21 AM

## 2020-11-05 NOTE — Plan of Care (Signed)
  Problem: Education: Goal: Knowledge of General Education information will improve Description: Including pain rating scale, medication(s)/side effects and non-pharmacologic comfort measures Outcome: Progressing   Problem: Health Behavior/Discharge Planning: Goal: Ability to manage health-related needs will improve Outcome: Progressing   Problem: Clinical Measurements: Goal: Ability to maintain clinical measurements within normal limits will improve Outcome: Progressing Goal: Will remain free from infection Outcome: Progressing Goal: Diagnostic test results will improve Outcome: Progressing Goal: Cardiovascular complication will be avoided Outcome: Progressing   Problem: Activity: Goal: Risk for activity intolerance will decrease Outcome: Progressing   Problem: Nutrition: Goal: Adequate nutrition will be maintained Outcome: Progressing   Problem: Coping: Goal: Level of anxiety will decrease Outcome: Progressing   Problem: Elimination: Goal: Will not experience complications related to bowel motility Outcome: Progressing   Problem: Pain Managment: Goal: General experience of comfort will improve Outcome: Progressing   Problem: Safety: Goal: Ability to remain free from injury will improve Outcome: Progressing   Problem: Skin Integrity: Goal: Risk for impaired skin integrity will decrease Outcome: Progressing   Problem: Education: Goal: Ability to demonstrate management of disease process will improve Outcome: Progressing Goal: Ability to verbalize understanding of medication therapies will improve Outcome: Progressing Goal: Individualized Educational Video(s) Outcome: Progressing   Problem: Activity: Goal: Capacity to carry out activities will improve Outcome: Progressing   Problem: Cardiac: Goal: Ability to achieve and maintain adequate cardiopulmonary perfusion will improve Outcome: Progressing   Problem: Education: Goal: Knowledge of disease or  condition will improve Outcome: Progressing Goal: Understanding of medication regimen will improve Outcome: Progressing Goal: Individualized Educational Video(s) Outcome: Progressing   Problem: Activity: Goal: Ability to tolerate increased activity will improve Outcome: Progressing   Problem: Cardiac: Goal: Ability to achieve and maintain adequate cardiopulmonary perfusion will improve Outcome: Progressing   Problem: Health Behavior/Discharge Planning: Goal: Ability to safely manage health-related needs after discharge will improve Outcome: Progressing   Problem: Education: Goal: Understanding of CV disease, CV risk reduction, and recovery process will improve Outcome: Progressing Goal: Individualized Educational Video(s) Outcome: Progressing   Problem: Activity: Goal: Ability to return to baseline activity level will improve Outcome: Progressing   Problem: Cardiovascular: Goal: Ability to achieve and maintain adequate cardiovascular perfusion will improve Outcome: Progressing Goal: Vascular access site(s) Level 0-1 will be maintained Outcome: Progressing   Problem: Health Behavior/Discharge Planning: Goal: Ability to safely manage health-related needs after discharge will improve Outcome: Progressing   Problem: Education: Goal: Knowledge of disease and its progression will improve Outcome: Progressing   Problem: Health Behavior/Discharge Planning: Goal: Ability to manage health-related needs will improve Outcome: Progressing   Problem: Clinical Measurements: Goal: Complications related to the disease process or treatment will be avoided or minimized Outcome: Progressing Goal: Dialysis access will remain free of complications Outcome: Progressing   Problem: Activity: Goal: Activity intolerance will improve Outcome: Progressing   Problem: Fluid Volume: Goal: Fluid volume balance will be maintained or improved Outcome: Progressing   Problem:  Nutritional: Goal: Ability to make appropriate dietary choices will improve Outcome: Progressing   Problem: Respiratory: Goal: Respiratory symptoms related to disease process will be avoided Outcome: Progressing   Problem: Self-Concept: Goal: Body image disturbance will be avoided or minimized Outcome: Progressing   Problem: Urinary Elimination: Goal: Progression of disease will be identified and treated Outcome: Progressing

## 2020-11-05 NOTE — Progress Notes (Signed)
[facility] NEPHROLOGY PROGRESS NOTE  Assessment/ Plan:  Acute kidney injury on chronic kidney disease stage III: With underlying chronic kidney disease likely from diabetes/hypertension and worsening renal function from hemodynamically mediated ATN and cardiorenal syndrome. CRRT 3/15-3/22, 3/24-27; HD since 3/28. -Unfortunately, no signs of renal recovery at the moment  -Plan next HD today-- will need to make sure can tolerate adequate UF with HD to maintain euvolemia.  Try 1.5L UF today - Outpt CLIP underway, needs TDC and eval for perm access prior to d/c.  It's going to be a long road with his LVAD, deconditioning but he wishes to try. Will order vein mapping. IR consult re: TDC placed, nonurgent - he tells me planned Mon -strict I/O  Hyponatremia -hypervolemic. Pain is also definitely a contributing factor. Pain ctrl per primary service. Na being managed with renal replacement therapy  Acute exacerbation of chronic systolic heart failure with cardiogenic shock: LVAD, placed 3/10. Volume status/UF managed with RRT.   Per above UF to euvolemia.  Anemia:Likely secondary to chronic disease and exacerbated by recent surgical losses. Cont ESA qMon.   Transfuse PRBC as needed.  Iron deficient, being repleted IV.  Atrial fibrillation: per cardiology  Protein calorie malnutrition, hypoalbuminemia: push protein, per primary  Pressure ulcers per primary, see above for pain control  Jannifer Hick MD [facility] Pager [phone]  Subjective: no overnight events. He still has some vertigo this AM.  For HD today.  Objective Vital signs in last 24 hours: Vitals:   11/04/20 1602 11/04/20 2300 11/05/20 0453 11/05/20 0500  BP:  (!) 97/54 101/68   Pulse:  81 77   Resp:  16 16   Temp:  98.5 F (36.9 C) 98.4 F (36.9 C)   TempSrc:  Oral Oral   SpO2: 98% 98% 98%   Weight:    85.8 kg  Height:       Weight change: 0.3 kg  Intake/Output Summary (Last 24 hours)  at 11/05/2020 1001 Last data filed at 11/04/2020 1400 Gross per 24 hour  Intake --  Output 300 ml  Net -300 ml       Labs: Basic Metabolic Panel: Recent Labs  Lab 11/03/20 0540 11/04/20 0334 11/05/20 0500  NA 132* 135 133*  K 5.2* 4.3 4.9  CL 95* 98 95*  CO2 25 27 25   GLUCOSE 154* 125* 70  BUN 87* 50* 69*  CREATININE 6.37* 4.32* 5.75*  CALCIUM 9.2 8.8* 9.2  PHOS 4.8* 3.9 6.6*   Liver Function Tests: Recent Labs  Lab 11/03/20 0540 11/04/20 0334 11/05/20 0500  ALBUMIN 2.4* 2.9* 2.8*   No results for input(s): LIPASE, AMYLASE in the last 168 hours. No results for input(s): AMMONIA in the last 168 hours. CBC: Recent Labs  Lab 10/30/20 0404 10/31/20 0420 11/01/20 0521 11/01/20 2003  WBC 9.8 11.3* 11.0* 12.2*  HGB 8.9* 8.3* 8.4* 8.3*  HCT 29.6* 27.2* 27.1* 27.3*  MCV 91.9 90.7 90.3 90.4  PLT 278 277 279 301   Cardiac Enzymes: No results for input(s): CKTOTAL, CKMB, CKMBINDEX, TROPONINI in the last 168 hours. CBG: Recent Labs  Lab 11/04/20 1138 11/04/20 1641 11/04/20 2119 11/05/20 0638 11/05/20 0938  GLUCAP 110* 95 74 67* 91    Iron Studies:  Recent Labs    11/03/20 0540  IRON 40*  TIBC 344  FERRITIN 158   Studies/Results: VAS [place] UPPER EXT VEIN MAPPING (PRE-OP AVF)  Result Date: 11/04/2020 UPPER EXTREMITY VEIN MAPPING  Indications: Pre-access. Comparison Study: no prior  Performing Technologist: Abram Sander RVS  Examination Guidelines: A complete evaluation includes B-mode imaging, spectral Doppler, color Doppler, and power Doppler as needed of all accessible portions of each vessel. Bilateral testing is considered an integral part of a complete examination. Limited examinations for reoccurring indications may be performed as noted. +-----------------+-------------+----------+--------------+ Right Cephalic   Diameter (cm)Depth (cm)   Findings    +-----------------+-------------+----------+--------------+ Shoulder             0.18        1.25                   +-----------------+-------------+----------+--------------+ Prox upper arm       0.32        1.17                  +-----------------+-------------+----------+--------------+ Mid upper arm                           not visualized +-----------------+-------------+----------+--------------+ Dist upper arm                          not visualized +-----------------+-------------+----------+--------------+ Antecubital fossa    0.43        0.37                  +-----------------+-------------+----------+--------------+ Prox forearm         0.34        0.57                  +-----------------+-------------+----------+--------------+ Mid forearm          0.18        0.49                  +-----------------+-------------+----------+--------------+ Dist forearm         0.17        0.42                  +-----------------+-------------+----------+--------------+ Wrist                0.14        0.30                  +-----------------+-------------+----------+--------------+ +-----------------+-------------+----------+--------------+ Right Basilic    Diameter (cm)Depth (cm)   Findings    +-----------------+-------------+----------+--------------+ Prox upper arm       0.39        1.49                  +-----------------+-------------+----------+--------------+ Mid upper arm        0.39        1.35                  +-----------------+-------------+----------+--------------+ Dist upper arm       0.41        0.86                  +-----------------+-------------+----------+--------------+ Antecubital fossa    0.32        0.66                  +-----------------+-------------+----------+--------------+ Prox forearm         0.20        0.33     branching    +-----------------+-------------+----------+--------------+ Mid forearm  not visualized +-----------------+-------------+----------+--------------+ Distal forearm                           not visualized +-----------------+-------------+----------+--------------+ Elbow                                   not visualized +-----------------+-------------+----------+--------------+ Wrist                                   not visualized +-----------------+-------------+----------+--------------+ +-----------------+-------------+----------+--------------+ Left Cephalic    Diameter (cm)Depth (cm)   Findings    +-----------------+-------------+----------+--------------+ Shoulder             0.24        1.20                  +-----------------+-------------+----------+--------------+ Prox upper arm       0.24        1.01                  +-----------------+-------------+----------+--------------+ Mid upper arm        0.34        0.63                  +-----------------+-------------+----------+--------------+ Dist upper arm       0.29        0.71                  +-----------------+-------------+----------+--------------+ Antecubital fossa    0.38        0.46                  +-----------------+-------------+----------+--------------+ Prox forearm         0.37        0.47                  +-----------------+-------------+----------+--------------+ Mid forearm          0.30        0.30     branching    +-----------------+-------------+----------+--------------+ Dist forearm                            not visualized +-----------------+-------------+----------+--------------+ Wrist                                   not visualized +-----------------+-------------+----------+--------------+ +-----------------+-------------+----------+--------------+ Left Basilic     Diameter (cm)Depth (cm)   Findings    +-----------------+-------------+----------+--------------+ Prox upper arm       0.45        1.65                  +-----------------+-------------+----------+--------------+ Mid upper arm        0.34        1.39                   +-----------------+-------------+----------+--------------+ Dist upper arm       0.35        1.18                  +-----------------+-------------+----------+--------------+ Antecubital fossa    0.27        0.65                  +-----------------+-------------+----------+--------------+  Prox forearm         0.23        0.63     branching    +-----------------+-------------+----------+--------------+ Mid forearm                             not visualized +-----------------+-------------+----------+--------------+ Distal forearm                          not visualized +-----------------+-------------+----------+--------------+ Elbow                                   not visualized +-----------------+-------------+----------+--------------+ *See table(s) above for measurements and observations.  Diagnosing physician: Monica Martinez MD Electronically signed by Monica Martinez MD on 11/04/2020 at 1:54:18 PM.    Final     Medications: Infusions: . sodium chloride    . sodium chloride    . [START ON 11/07/2020]  ceFAZolin (ANCEF) IV    . ferric gluconate (FERRLECIT/NULECIT) IV    . lactated ringers Stopped (10/17/20 1655)    Scheduled Medications: . (feeding supplement) PROSource Plus  30 mL Oral BID BM  . amiodarone  200 mg Oral Daily  . aspirin EC  81 mg Oral Daily  . Chlorhexidine Gluconate Cloth  6 each Topical Daily  . Chlorhexidine Gluconate Cloth  6 each Topical Q0600  . feeding supplement  237 mL Oral TID BM  . gabapentin  100 mg Oral QHS  . insulin aspart  0-20 Units Subcutaneous TID WC  . insulin aspart  0-5 Units Subcutaneous QHS  . insulin glargine  20 Units Subcutaneous BID  . midodrine  15 mg Oral TID WC  . multivitamin  1 tablet Oral QHS  . pantoprazole  40 mg Oral Daily  . polyethylene glycol  17 g Oral Daily  . sildenafil  20 mg Oral TID  . sodium chloride flush  10-40 mL Intracatheter Q12H  . Warfarin - Pharmacist Dosing Inpatient    Does not apply q1600    have reviewed scheduled and prn medications.  Physical Exam: General: NAD, lying in bed comfortably Heart: Mechanical heart sound/lvad hum Lungs: cta bl Abdomen:soft, Non-tender, non-distended Extremities: no edema bl Dialysis Access: Temporary RIJ HD catheter.  Marcus Johnson 11/05/2020,10:01 AM  LOS: 38 days

## 2020-11-05 NOTE — Progress Notes (Signed)
Pt taken to dialysis with back up bag and batteries

## 2020-11-05 NOTE — Progress Notes (Signed)
ANTICOAGULATION CONSULT NOTE - Follow Up Consult  Pharmacy Consult for Warfarin Indication: LVAD  Allergies  Allergen Reactions  . Meclizine Hcl Anaphylaxis and Swelling  . Ivabradine Nausea Only    Patient Measurements: Height: 5' 10.5" (179.1 cm) Weight: 85.8 kg (189 lb 2.5 oz) IBW/kg (Calculated) : 74.15 Heparin Dosing Weight: 89.2 kg  Vital Signs: Temp: 98.4 F (36.9 C) (04/02 0453) Temp Source: Oral (04/02 0453) BP: 110/82 (04/02 0800) Pulse Rate: 81 (04/02 0800)  Labs: Recent Labs    11/03/20 0540 11/04/20 0334 11/05/20 0500  LABPROT 25.0* 28.4* 30.3*  INR 2.4* 2.8* 3.0*  CREATININE 6.37* 4.32* 5.75*    Estimated Creatinine Clearance: 14.5 mL/min (A) (by C-G formula based on SCr of 5.75 mg/dL (H)).   Medical History: Past Medical History:  Diagnosis Date  . AICD (automatic cardioverter/defibrillator) present    Single-chamber  implantable cardiac defibrillator - Medtronic  . Atrial fibrillation ([place])   . Cataract    Mixed form OU  . CHF (congestive heart failure) ([place])   . Chronic kidney disease   . Chronic kidney disease (CKD), stage III (moderate) (HCC)   . Chronic systolic heart failure ([place])    a. Echo 5/13: Mild LVE, mild LVH, EF 10%, anteroseptal, lateral, apical AK, mild MR, mild LAE, moderately reduced RVSF, mild RAE, PASP 60;  b. 07/2014 Echo: EF 20-2%, diff HK, AKI of antsep/apical/mid-apicalinferior, mod reduced RV.  Marland Kitchen Coronary artery disease    a. s/p CABG 2002 b. LHC 5/13:  dLM 80%, LAD subtotally occluded, pCFX occluded, pRCA 50%, mid? Occlusion with high take off of the PDA with 70% multiple lesions-not bypassed and supplies collaterals to LAD, LIMA-IM/ramus ok, S-OM ok, S-PLA branches ok. Medical therapy was recommended  . COVID   . Diabetic retinopathy (HCC)    NPDR OD, PDR OS  . Dyspnea   . Gout    "on daily RX" (01/08/2018)  . Hypertension   . Hypertensive retinopathy    OU  . Ischemic cardiomyopathy    a. Echo 5/13: Mild LVE, mild  LVH, EF 10%, anteroseptal, lateral, apical AK, mild MR, mild LAE, moderately reduced RVSF, mild RAE, PASP 60;  b. 01/2012 s/p MDT [id number] Protecta XT VR AICD;  c. 07/2014 Echo: EF 20-2%, diff HK, AKI of antsep/apical/mid-apicalinferior, mod reduced RV.  Marland Kitchen MRSA (methicillin resistant Staphylococcus aureus)    Status post right foot plantar deep infection with MRSA status post  I&D 10/2008  . Myocardial infarction [facility])    "was told I'd had several before heart OR in 2002" (01/08/2018)  . Noncompliance   . Peripheral neuropathy   . Retinopathy, diabetic, background ([place])   . Syncope   . Type II diabetes mellitus ([place])    requiring insulin   . Vitreous hemorrhage, left (HCC)    and proliferative diabetic retinopathy    Assessment: 60 yo M s/p new LVAD implant with HeartMate III on 3/10. Warfarin started per MD on 3/12, and pharmacy asked to take over warfarin dosing.   INR still above goal and up from 2.8 to 3 today after lower dose yesterday. Was taking 2-3 Ensures daily, but now is not eating much and only drank 30 ml of Ensure yesterday. Hgb stable at last check. No overt bleeding. LDH stable 200s  Goal of Therapy:  INR 2-2.5 Monitor platelets by anticoagulation protocol: Yes   Plan:  Warfarin 1 mg PO tonight  Daily PT/INR, CBC, monitor s/sx bleeding  [name], PharmD, BCPS PGY2 Cardiology Pharmacy Resident Phone:  [phone] 11/05/2020  11:28 AM  Please check [email or web address] for unit-specific pharmacy phone numbers.

## 2020-11-06 DIAGNOSIS — I5023 Acute on chronic systolic (congestive) heart failure: Secondary | ICD-10-CM | POA: Diagnosis not present

## 2020-11-06 DIAGNOSIS — Z95811 Presence of heart assist device: Secondary | ICD-10-CM | POA: Diagnosis not present

## 2020-11-06 DIAGNOSIS — I5043 Acute on chronic combined systolic (congestive) and diastolic (congestive) heart failure: Secondary | ICD-10-CM | POA: Diagnosis not present

## 2020-11-06 LAB — CBC
HCT: 28.3 % — ABNORMAL LOW (ref 39.0–52.0)
Hemoglobin: 8.7 g/dL — ABNORMAL LOW (ref 13.0–17.0)
MCH: 27.6 pg (ref 26.0–34.0)
MCHC: 30.7 g/dL (ref 30.0–36.0)
MCV: 89.8 fL (ref 80.0–100.0)
Platelets: 300 10*3/uL (ref 150–400)
RBC: 3.15 MIL/uL — ABNORMAL LOW (ref 4.22–5.81)
RDW: 17.4 % — ABNORMAL HIGH (ref 11.5–15.5)
WBC: 8.3 10*3/uL (ref 4.0–10.5)
nRBC: 0 % (ref 0.0–0.2)

## 2020-11-06 LAB — PROTIME-INR
INR: 3 — ABNORMAL HIGH (ref 0.8–1.2)
Prothrombin Time: 30.5 seconds — ABNORMAL HIGH (ref 11.4–15.2)

## 2020-11-06 LAB — COOXEMETRY PANEL
Carboxyhemoglobin: 2.1 % — ABNORMAL HIGH (ref 0.5–1.5)
Methemoglobin: 0.8 % (ref 0.0–1.5)
O2 Saturation: 50.3 %
Total hemoglobin: 8.8 g/dL — ABNORMAL LOW (ref 12.0–16.0)

## 2020-11-06 LAB — RENAL FUNCTION PANEL
Albumin: 2.7 g/dL — ABNORMAL LOW (ref 3.5–5.0)
Anion gap: 11 (ref 5–15)
BUN: 41 mg/dL — ABNORMAL HIGH (ref 6–20)
CO2: 28 mmol/L (ref 22–32)
Calcium: 9.1 mg/dL (ref 8.9–10.3)
Chloride: 97 mmol/L — ABNORMAL LOW (ref 98–111)
Creatinine, Ser: 4.37 mg/dL — ABNORMAL HIGH (ref 0.61–1.24)
GFR, Estimated: 15 mL/min — ABNORMAL LOW (ref >60)
Glucose, Bld: 107 mg/dL — ABNORMAL HIGH (ref 70–99)
Phosphorus: 5.2 mg/dL — ABNORMAL HIGH (ref 2.5–4.6)
Potassium: 4.6 mmol/L (ref 3.5–5.1)
Sodium: 136 mmol/L (ref 135–145)

## 2020-11-06 LAB — GLUCOSE, CAPILLARY
Glucose-Capillary: 113 mg/dL — ABNORMAL HIGH (ref 70–99)
Glucose-Capillary: 99 mg/dL (ref 70–99)
Glucose-Capillary: 114 mg/dL — ABNORMAL HIGH (ref 70–99)
Glucose-Capillary: 113 mg/dL — ABNORMAL HIGH (ref 70–99)

## 2020-11-06 LAB — LACTATE DEHYDROGENASE: LDH: 217 U/L — ABNORMAL HIGH (ref 98–192)

## 2020-11-06 LAB — MAGNESIUM: Magnesium: 2.1 mg/dL (ref 1.7–2.4)

## 2020-11-06 LAB — VITAMIN A: Vitamin A (Retinoic Acid): 90 ug/dL — ABNORMAL HIGH (ref 20.1–62.0)

## 2020-11-06 MED ORDER — WARFARIN SODIUM 1 MG PO TABS
1.0000 mg | ORAL_TABLET | Freq: Once | ORAL | Status: AC
  Administered 2020-11-06: 1 mg via ORAL
  Filled 2020-11-06: qty 1

## 2020-11-06 MED ORDER — SENNOSIDES-DOCUSATE SODIUM 8.6-50 MG PO TABS
1.0000 | ORAL_TABLET | Freq: Every day | ORAL | Status: DC
  Administered 2020-11-06: 1 via ORAL
  Filled 2020-11-06: qty 1

## 2020-11-06 NOTE — Progress Notes (Signed)
ANTICOAGULATION CONSULT NOTE - Follow Up Consult  Pharmacy Consult for Warfarin Indication: LVAD  Allergies  Allergen Reactions  . Meclizine Hcl Anaphylaxis and Swelling  . Ivabradine Nausea Only    Patient Measurements: Height: 5' 10.5" (179.1 cm) Weight: 86.5 kg (190 lb 11.2 oz) IBW/kg (Calculated) : 74.15 Heparin Dosing Weight: 89.2 kg  Vital Signs: Temp: 98.5 F (36.9 C) (04/03 1406) Temp Source: Oral (04/03 1406) BP: 104/89 (04/03 1406) Pulse Rate: 82 (04/03 0850)  Labs: Recent Labs    11/04/20 0334 11/05/20 0500 11/06/20 0500  HGB  --   --  8.7*  HCT  --   --  28.3*  PLT  --   --  300  LABPROT 28.4* 30.3* 30.5*  INR 2.8* 3.0* 3.0*  CREATININE 4.32* 5.75* 4.37*    Estimated Creatinine Clearance: 19.1 mL/min (A) (by C-G formula based on SCr of 4.37 mg/dL (H)).   Medical History: Past Medical History:  Diagnosis Date  . AICD (automatic cardioverter/defibrillator) present    Single-chamber  implantable cardiac defibrillator - Medtronic  . Atrial fibrillation ([place])   . Cataract    Mixed form OU  . CHF (congestive heart failure) ([place])   . Chronic kidney disease   . Chronic kidney disease (CKD), stage III (moderate) (HCC)   . Chronic systolic heart failure ([place])    a. Echo 5/13: Mild LVE, mild LVH, EF 10%, anteroseptal, lateral, apical AK, mild MR, mild LAE, moderately reduced RVSF, mild RAE, PASP 60;  b. 07/2014 Echo: EF 20-2%, diff HK, AKI of antsep/apical/mid-apicalinferior, mod reduced RV.  Marland Kitchen Coronary artery disease    a. s/p CABG 2002 b. LHC 5/13:  dLM 80%, LAD subtotally occluded, pCFX occluded, pRCA 50%, mid? Occlusion with high take off of the PDA with 70% multiple lesions-not bypassed and supplies collaterals to LAD, LIMA-IM/ramus ok, S-OM ok, S-PLA branches ok. Medical therapy was recommended  . COVID   . Diabetic retinopathy (HCC)    NPDR OD, PDR OS  . Dyspnea   . Gout    "on daily RX" (01/08/2018)  . Hypertension   . Hypertensive retinopathy     OU  . Ischemic cardiomyopathy    a. Echo 5/13: Mild LVE, mild LVH, EF 10%, anteroseptal, lateral, apical AK, mild MR, mild LAE, moderately reduced RVSF, mild RAE, PASP 60;  b. 01/2012 s/p MDT [id number] Protecta XT VR AICD;  c. 07/2014 Echo: EF 20-2%, diff HK, AKI of antsep/apical/mid-apicalinferior, mod reduced RV.  Marland Kitchen MRSA (methicillin resistant Staphylococcus aureus)    Status post right foot plantar deep infection with MRSA status post  I&D 10/2008  . Myocardial infarction [facility])    "was told I'd had several before heart OR in 2002" (01/08/2018)  . Noncompliance   . Peripheral neuropathy   . Retinopathy, diabetic, background ([place])   . Syncope   . Type II diabetes mellitus ([place])    requiring insulin   . Vitreous hemorrhage, left (HCC)    and proliferative diabetic retinopathy    Assessment: 60 yo M s/p new LVAD implant with HeartMate III on 3/10. Warfarin started per MD on 3/12, and pharmacy asked to take over warfarin dosing.   INR still above goal at 3 again today after lower dose yesterday. He is drinking more Ensures again ~1/day. CBC stable with Hgb 8s, pltc 300. LDH stable 200s  Goal of Therapy:  INR 2-2.5 Monitor platelets by anticoagulation protocol: Yes   Plan:  Warfarin 1 mg PO tonight again Daily PT/INR, CBC, monitor s/sx  bleeding  [name], PharmD, BCPS PGY2 Cardiology Pharmacy Resident Phone: [phone] 11/06/2020  3:05 PM  Please check [email or web address] for unit-specific pharmacy phone numbers.

## 2020-11-06 NOTE — Progress Notes (Signed)
[facility] NEPHROLOGY PROGRESS NOTE  Assessment/ Plan:  Acute kidney injury on chronic kidney disease stage III: With underlying chronic kidney disease likely from diabetes/hypertension and worsening renal function from hemodynamically mediated ATN and cardiorenal syndrome. CRRT 3/15-3/22, 3/24-27; HD since 3/28. -Unfortunately, no signs of renal recovery at the moment  -Plan next HD Tues.  Tolerating ok so far.  - Outpt CLIP underway.  TDC conversion tomorrow.  S/p Vein map - if all moving in right direction poss eval for perm access but would d/w CHF team prior.  It's going to be a long road with his LVAD, deconditioning but he wishes to try.  -strict I/O  Hyponatremia, hypervolemic: now resolved. Cont volume maintenance with HD.   Acute exacerbation of chronic systolic heart failure with cardiogenic shock: LVAD, placed 3/10. Volume status/UF managed with RRT.   Per above UF to euvolemia.  Anemia:Likely secondary to chronic disease and exacerbated by recent surgical losses. Cont ESA qMon.   Transfuse PRBC as needed.  Iron deficient, has been repleted IV.  Atrial fibrillation: per cardiology  Protein calorie malnutrition, hypoalbuminemia: push protein, per primary  Jannifer Hick MD [place] Pager [phone]  Subjective: no overnight events. HD yesterday UF 941mL.  UOP 384mL  No new issues.   Objective Vital signs in last 24 hours: Vitals:   11/05/20 1718 11/05/20 2003 11/05/20 2324 11/06/20 0414  BP: 91/78 96/75 112/87 (!) 103/91  Pulse: 78 80 81 84  Resp: 16 20 20 14   Temp: 98.6 F (37 C) 98 F (36.7 C) 98.2 F (36.8 C) 97.8 F (36.6 C)  TempSrc: Oral Oral Oral Oral  SpO2:  98% 99% (!) 12%  Weight:    86.5 kg  Height:       Weight change: 2.9 kg  Intake/Output Summary (Last 24 hours) at 11/06/2020 0745 Last data filed at 11/06/2020 0400 Gross per 24 hour  Intake 240 ml  Output 1249 ml  Net -1009 ml       Labs: Basic Metabolic  Panel: Recent Labs  Lab 11/04/20 0334 11/05/20 0500 11/06/20 0500  NA 135 133* 136  K 4.3 4.9 4.6  CL 98 95* 97*  CO2 27 25 28   GLUCOSE 125* 70 107*  BUN 50* 69* 41*  CREATININE 4.32* 5.75* 4.37*  CALCIUM 8.8* 9.2 9.1  PHOS 3.9 6.6* 5.2*   Liver Function Tests: Recent Labs  Lab 11/04/20 0334 11/05/20 0500 11/06/20 0500  ALBUMIN 2.9* 2.8* 2.7*   No results for input(s): LIPASE, AMYLASE in the last 168 hours. No results for input(s): AMMONIA in the last 168 hours. CBC: Recent Labs  Lab 10/31/20 0420 11/01/20 0521 11/01/20 2003 11/06/20 0500  WBC 11.3* 11.0* 12.2* 8.3  HGB 8.3* 8.4* 8.3* 8.7*  HCT 27.2* 27.1* 27.3* 28.3*  MCV 90.7 90.3 90.4 89.8  PLT 277 279 301 300   Cardiac Enzymes: No results for input(s): CKTOTAL, CKMB, CKMBINDEX, TROPONINI in the last 168 hours. CBG: Recent Labs  Lab 11/05/20 0938 11/05/20 1136 11/05/20 1735 11/05/20 2118 11/06/20 0610  GLUCAP 91 133* 79 98 113*    Iron Studies:  No results for input(s): IRON, TIBC, TRANSFERRIN, FERRITIN in the last 72 hours. Studies/Results: VAS [place] UPPER EXT VEIN MAPPING (PRE-OP AVF)  Result Date: 11/04/2020 UPPER EXTREMITY VEIN MAPPING  Indications: Pre-access. Comparison Study: no prior Performing Technologist: Abram Sander RVS  Examination Guidelines: A complete evaluation includes B-mode imaging, spectral Doppler, color Doppler, and power Doppler as needed of all accessible portions of  each vessel. Bilateral testing is considered an integral part of a complete examination. Limited examinations for reoccurring indications may be performed as noted. +-----------------+-------------+----------+--------------+ Right Cephalic   Diameter (cm)Depth (cm)   Findings    +-----------------+-------------+----------+--------------+ Shoulder             0.18        1.25                  +-----------------+-------------+----------+--------------+ Prox upper arm       0.32        1.17                   +-----------------+-------------+----------+--------------+ Mid upper arm                           not visualized +-----------------+-------------+----------+--------------+ Dist upper arm                          not visualized +-----------------+-------------+----------+--------------+ Antecubital fossa    0.43        0.37                  +-----------------+-------------+----------+--------------+ Prox forearm         0.34        0.57                  +-----------------+-------------+----------+--------------+ Mid forearm          0.18        0.49                  +-----------------+-------------+----------+--------------+ Dist forearm         0.17        0.42                  +-----------------+-------------+----------+--------------+ Wrist                0.14        0.30                  +-----------------+-------------+----------+--------------+ +-----------------+-------------+----------+--------------+ Right Basilic    Diameter (cm)Depth (cm)   Findings    +-----------------+-------------+----------+--------------+ Prox upper arm       0.39        1.49                  +-----------------+-------------+----------+--------------+ Mid upper arm        0.39        1.35                  +-----------------+-------------+----------+--------------+ Dist upper arm       0.41        0.86                  +-----------------+-------------+----------+--------------+ Antecubital fossa    0.32        0.66                  +-----------------+-------------+----------+--------------+ Prox forearm         0.20        0.33     branching    +-----------------+-------------+----------+--------------+ Mid forearm                             not visualized +-----------------+-------------+----------+--------------+ Distal forearm  not visualized +-----------------+-------------+----------+--------------+ Elbow                                    not visualized +-----------------+-------------+----------+--------------+ Wrist                                   not visualized +-----------------+-------------+----------+--------------+ +-----------------+-------------+----------+--------------+ Left Cephalic    Diameter (cm)Depth (cm)   Findings    +-----------------+-------------+----------+--------------+ Shoulder             0.24        1.20                  +-----------------+-------------+----------+--------------+ Prox upper arm       0.24        1.01                  +-----------------+-------------+----------+--------------+ Mid upper arm        0.34        0.63                  +-----------------+-------------+----------+--------------+ Dist upper arm       0.29        0.71                  +-----------------+-------------+----------+--------------+ Antecubital fossa    0.38        0.46                  +-----------------+-------------+----------+--------------+ Prox forearm         0.37        0.47                  +-----------------+-------------+----------+--------------+ Mid forearm          0.30        0.30     branching    +-----------------+-------------+----------+--------------+ Dist forearm                            not visualized +-----------------+-------------+----------+--------------+ Wrist                                   not visualized +-----------------+-------------+----------+--------------+ +-----------------+-------------+----------+--------------+ Left Basilic     Diameter (cm)Depth (cm)   Findings    +-----------------+-------------+----------+--------------+ Prox upper arm       0.45        1.65                  +-----------------+-------------+----------+--------------+ Mid upper arm        0.34        1.39                  +-----------------+-------------+----------+--------------+ Dist upper arm       0.35        1.18                   +-----------------+-------------+----------+--------------+ Antecubital fossa    0.27        0.65                  +-----------------+-------------+----------+--------------+ Prox forearm         0.23        0.63     branching    +-----------------+-------------+----------+--------------+  Mid forearm                             not visualized +-----------------+-------------+----------+--------------+ Distal forearm                          not visualized +-----------------+-------------+----------+--------------+ Elbow                                   not visualized +-----------------+-------------+----------+--------------+ *See table(s) above for measurements and observations.  Diagnosing physician: Monica Martinez MD Electronically signed by Monica Martinez MD on 11/04/2020 at 1:54:18 PM.    Final     Medications: Infusions: . sodium chloride    . sodium chloride    . [START ON 11/07/2020]  ceFAZolin (ANCEF) IV    . ferric gluconate (FERRLECIT/NULECIT) IV 125 mg (11/05/20 1635)  . lactated ringers Stopped (10/17/20 1655)    Scheduled Medications: . (feeding supplement) PROSource Plus  30 mL Oral BID BM  . amiodarone  200 mg Oral Daily  . aspirin EC  81 mg Oral Daily  . Chlorhexidine Gluconate Cloth  6 each Topical Daily  . Chlorhexidine Gluconate Cloth  6 each Topical Q0600  . feeding supplement  237 mL Oral TID BM  . gabapentin  100 mg Oral QHS  . insulin aspart  0-20 Units Subcutaneous TID WC  . insulin aspart  0-5 Units Subcutaneous QHS  . insulin glargine  20 Units Subcutaneous BID  . midodrine  15 mg Oral TID WC  . multivitamin  1 tablet Oral QHS  . pantoprazole  40 mg Oral Daily  . polyethylene glycol  17 g Oral Daily  . sildenafil  20 mg Oral TID  . sodium chloride flush  10-40 mL Intracatheter Q12H  . Warfarin - Pharmacist Dosing Inpatient   Does not apply q1600    have reviewed scheduled and prn medications.  Physical Exam: General: NAD, lying  in bed comfortably Heart: Mechanical heart sound/lvad hum Lungs: cta bl Abdomen:soft, Non-tender, non-distended Extremities: no edema bl Dialysis Access: Temporary RIJ HD catheter.  Ria Comment A Ridge Lafond 11/06/2020,7:45 AM  LOS: 39 days

## 2020-11-06 NOTE — Progress Notes (Signed)
Patient ID: Marcus Johnson., male   DOB: 1961/07/19, 61 y.o.   MRN: [id number]     Advanced Heart Failure Rounding Note  PCP-Cardiologist: No primary care provider on file.   Subjective:    - 2/27 Milrinone switched to dobutamine 5 mcg.  - 2/28 Moved to ICU. Dobutamine increased to 7.5 mcg and Norepi 3 mcg added. Diuretics held.  - 3/1 Impella 5.5 placed.  - 3/4 Teeth removed - 3/10 HM3 LVAD placed - 3/13 VAD speed increased to 5500. [name] out  - 3/14 Nephrology consulted. Given 1UPRBCs  - 3/15 Started CRRT. Given 1UPRBCs.  - 3/23 VAD Speed turned down to 5300  - 3/23 CRRT and Milrinone Stopped. Back in Afib - 3/24 CRRT restarted.  - 3/27 CVVH off.  -3/28 iHD -3/29 iHD. Walked 75 feet!! -3/20 Ramp Echo: Fixed speed: 5200 Low speed limit: 4900  Remains anuric. Tolerated iHD well yesterday. Denies SOB, orthopnea or PND  LVAD Interrogation HM 3: Speed: 5200 Flow: 4.4  PI: 3.7  Power: 4.0. VAD interrogated personally. Parameters stable.  Objective:   Weight Range: 86.5 kg Body mass index is 26.98 kg/m.   Vital Signs:   Temp:  [97.7 F (36.5 C)-98.6 F (37 C)] 98.2 F (36.8 C) (04/03 0850) Pulse Rate:  [52-87] 82 (04/03 0850) Resp:  [14-20] 14 (04/03 0414) BP: (86-112)/(46-91) 105/89 (04/03 0850) SpO2:  [12 %-99 %] 12 % (04/03 0414) Weight:  [86.5 kg-88.7 kg] 86.5 kg (04/03 0414) Last BM Date: 10/31/20  Weight change: Filed Weights   11/05/20 0500 11/05/20 1337 11/06/20 0414  Weight: 85.8 kg 88.7 kg 86.5 kg    Intake/Output:   Intake/Output Summary (Last 24 hours) at 11/06/2020 1006 Last data filed at 11/06/2020 0400 Gross per 24 hour  Intake 0 ml  Output 1249 ml  Net -1249 ml      Physical Exam   MAPs 90s   General:  NAD.  HEENT: normal  Neck: supple. JVP not elevated.  Carotids 2+ bilat; no bruits. No lymphadenopathy or thryomegaly appreciated. Cor: LVAD hum.  Lungs: Clear. Abdomen: obese soft, nontender, non-distended. No hepatosplenomegaly.  No bruits or masses. Good bowel sounds. Driveline site clean. Anchor in place.  Extremities: no cyanosis, clubbing, rash. Warm no edema  Neuro: alert & oriented x 3. No focal deficits. Moves all 4 without problem    Telemetry   NSR 70-80s Personally reviewed   Labs    CBC Recent Labs    11/06/20 0500  WBC 8.3  HGB 8.7*  HCT 28.3*  MCV 89.8  PLT 401   Basic Metabolic Panel Recent Labs    11/05/20 0500 11/06/20 0500  NA 133* 136  K 4.9 4.6  CL 95* 97*  CO2 25 28  GLUCOSE 70 107*  BUN 69* 41*  CREATININE 5.75* 4.37*  CALCIUM 9.2 9.1  MG 2.2 2.1  PHOS 6.6* 5.2*   Liver Function Tests Recent Labs    11/05/20 0500 11/06/20 0500  ALBUMIN 2.8* 2.7*   No results for input(s): LIPASE, AMYLASE in the last 72 hours. Cardiac Enzymes No results for input(s): CKTOTAL, CKMB, CKMBINDEX, TROPONINI in the last 72 hours.  BNP: BNP (last 3 results) Recent Labs    10/20/20 0305 10/27/20 0020 11/02/20 2310  BNP 466.7* 774.9* 486.0*    ProBNP (last 3 results) No results for input(s): PROBNP in the last 8760 hours.   D-Dimer No results for input(s): DDIMER in the last 72 hours. Hemoglobin A1C No results for input(s): HGBA1C  in the last 72 hours. Fasting Lipid Panel No results for input(s): CHOL, HDL, LDLCALC, TRIG, CHOLHDL, LDLDIRECT in the last 72 hours. Thyroid Function Tests No results for input(s): TSH, T4TOTAL, T3FREE, THYROIDAB in the last 72 hours.  Invalid input(s): FREET3  Other results:   Imaging    No results found.   Medications:     Scheduled Medications: . (feeding supplement) PROSource Plus  30 mL Oral BID BM  . amiodarone  200 mg Oral Daily  . aspirin EC  81 mg Oral Daily  . Chlorhexidine Gluconate Cloth  6 each Topical Daily  . Chlorhexidine Gluconate Cloth  6 each Topical Q0600  . feeding supplement  237 mL Oral TID BM  . gabapentin  100 mg Oral QHS  . insulin aspart  0-20 Units Subcutaneous TID WC  . insulin aspart  0-5 Units  Subcutaneous QHS  . insulin glargine  20 Units Subcutaneous BID  . midodrine  15 mg Oral TID WC  . multivitamin  1 tablet Oral QHS  . pantoprazole  40 mg Oral Daily  . polyethylene glycol  17 g Oral Daily  . sildenafil  20 mg Oral TID  . sodium chloride flush  10-40 mL Intracatheter Q12H  . Warfarin - Pharmacist Dosing Inpatient   Does not apply q1600    Infusions: . sodium chloride    . sodium chloride    . [START ON 11/07/2020]  ceFAZolin (ANCEF) IV    . ferric gluconate (FERRLECIT/NULECIT) IV 125 mg (11/05/20 1635)  . lactated ringers Stopped (10/17/20 1655)    PRN Medications: sodium chloride, sodium chloride, acetaminophen, alteplase, dextrose, fentaNYL (SUBLIMAZE) injection, heparin, heparin, lidocaine (PF), lidocaine-prilocaine, ondansetron (ZOFRAN) IV, oxyCODONE, pentafluoroprop-tetrafluoroeth, senna-docusate, sodium chloride flush, traZODone, white petrolatum    Assessment/Plan   1.Acute on chronic systolic HF -> cardiogenic shock:ICM.HasMedtronic ICD. EF 15%. Low output persisted despite milrinone and then dobutamine with rise in creatinine to 2.5. Impella 5.5 placed 3/1.  Creatinine improved to 1.7 and HM3 LVAD was placed on 3/10. Developed post-op renal failure requiring CVVH. Now off milrinone and epinephrine. Last echo showed moderate RV enlargement with mildly decreased RV function and VAD speed decreased to 5300 due to left-sided septum. CRRT stopped 3/23 but restarted 3/24. CVVH stopped again on 3/27. Ramp echo 3/30 with speed decreased to 5200. Co-ox remains marginal at 50%. Tolerating iHD for volume management. Next session on Tuesday   - Continue midodrine 15 mg tid.  - Continue sildenafil 20 mg tid for RV failure.  2. LVAD: 3/13 VAD speed increased to 5500. 3/23 speed decreased to 5300 and decreased to 5200 on 3/30. VAD interrogated personally. Parameters stable. - LDH 217. INR 3.0 today on warfarin, on ASA 81 daily. Discussed dosing with PharmD personally. - DL  site ok 3. CAD s/p CABG2002:Last cath in 6/19 with patent grafts.  - Continue Crestor 40 mg daily.  4. AKI on CKD Stage 3b: Suspect that this is a combination of cardiorenal syndrome and diabetic nephropathy and possibly ATN. Developed post-op AKI. Started on CRRT 3/15. CVVH stopped 3/23 but restarted 3/24.  - No signs of renal recovery yet, Tolerating iHD  - Tunneled catheter tomorrow.  5. Atrial flutter/fibrillation: s/p DC-CV 08/21/18. Maintaining NSR.     - Continue po amiodarone but decrease to 200 mg daily.  6. Wound R Foot: Partial thickness skin loss noted. Cover with mepilex border and change every 5 days.  7. Right arm pain: Likely neuropathic from stretch of brachial plexus with Impella placement.  8. ID:  WBCs normal. Afebrile. Blood Cx- NGTD.  9. GT:XMIWOEH of very poor control but hgbA1ctrending down. Most recent 7.7%.  Insulin drip stopped 3/24.  Now on lantus. Off meal coverage.   - Diabetes coordinators have seen 10. Anemia: Has been stable. hgb 8.7 11. Deconditioning - PT/OT following, continue aggressive PT/OT efforts - CIR consulted 12. Unstageable Pressure Ulcer on buttokcs.  - On hydrotherapy Mon-Sat.  - Continue reposition every 2 hours side lying position. Reposition frequently in the chair.  Glori Bickers MD 11/06/2020 10:06 AM

## 2020-11-06 NOTE — Plan of Care (Signed)
  Problem: Education: Goal: Knowledge of General Education information will improve Description: Including pain rating scale, medication(s)/side effects and non-pharmacologic comfort measures Outcome: Progressing   Problem: Health Behavior/Discharge Planning: Goal: Ability to manage health-related needs will improve Outcome: Progressing   Problem: Clinical Measurements: Goal: Ability to maintain clinical measurements within normal limits will improve Outcome: Progressing Goal: Will remain free from infection Outcome: Progressing Goal: Diagnostic test results will improve Outcome: Progressing Goal: Cardiovascular complication will be avoided Outcome: Progressing   Problem: Activity: Goal: Risk for activity intolerance will decrease Outcome: Progressing   Problem: Nutrition: Goal: Adequate nutrition will be maintained Outcome: Progressing   Problem: Coping: Goal: Level of anxiety will decrease Outcome: Progressing   Problem: Elimination: Goal: Will not experience complications related to bowel motility Outcome: Progressing   Problem: Pain Managment: Goal: General experience of comfort will improve Outcome: Progressing   Problem: Safety: Goal: Ability to remain free from injury will improve Outcome: Progressing   Problem: Skin Integrity: Goal: Risk for impaired skin integrity will decrease Outcome: Progressing   Problem: Education: Goal: Ability to demonstrate management of disease process will improve Outcome: Progressing Goal: Ability to verbalize understanding of medication therapies will improve Outcome: Progressing Goal: Individualized Educational Video(s) Outcome: Progressing   Problem: Activity: Goal: Capacity to carry out activities will improve Outcome: Progressing   Problem: Cardiac: Goal: Ability to achieve and maintain adequate cardiopulmonary perfusion will improve Outcome: Progressing   Problem: Education: Goal: Knowledge of disease or  condition will improve Outcome: Progressing Goal: Understanding of medication regimen will improve Outcome: Progressing Goal: Individualized Educational Video(s) Outcome: Progressing   Problem: Activity: Goal: Ability to tolerate increased activity will improve Outcome: Progressing   Problem: Cardiac: Goal: Ability to achieve and maintain adequate cardiopulmonary perfusion will improve Outcome: Progressing   Problem: Health Behavior/Discharge Planning: Goal: Ability to safely manage health-related needs after discharge will improve Outcome: Progressing   Problem: Education: Goal: Understanding of CV disease, CV risk reduction, and recovery process will improve Outcome: Progressing Goal: Individualized Educational Video(s) Outcome: Progressing   Problem: Activity: Goal: Ability to return to baseline activity level will improve Outcome: Progressing   Problem: Cardiovascular: Goal: Ability to achieve and maintain adequate cardiovascular perfusion will improve Outcome: Progressing Goal: Vascular access site(s) Level 0-1 will be maintained Outcome: Progressing   Problem: Health Behavior/Discharge Planning: Goal: Ability to safely manage health-related needs after discharge will improve Outcome: Progressing   Problem: Education: Goal: Knowledge of disease and its progression will improve Outcome: Progressing   Problem: Health Behavior/Discharge Planning: Goal: Ability to manage health-related needs will improve Outcome: Progressing   Problem: Clinical Measurements: Goal: Complications related to the disease process or treatment will be avoided or minimized Outcome: Progressing Goal: Dialysis access will remain free of complications Outcome: Progressing   Problem: Activity: Goal: Activity intolerance will improve Outcome: Progressing   Problem: Fluid Volume: Goal: Fluid volume balance will be maintained or improved Outcome: Progressing   Problem:  Nutritional: Goal: Ability to make appropriate dietary choices will improve Outcome: Progressing   Problem: Respiratory: Goal: Respiratory symptoms related to disease process will be avoided Outcome: Progressing   Problem: Self-Concept: Goal: Body image disturbance will be avoided or minimized Outcome: Progressing   Problem: Urinary Elimination: Goal: Progression of disease will be identified and treated Outcome: Progressing

## 2020-11-07 ENCOUNTER — Inpatient Hospital Stay (HOSPITAL_COMMUNITY): Payer: Medicare HMO

## 2020-11-07 DIAGNOSIS — I5023 Acute on chronic systolic (congestive) heart failure: Secondary | ICD-10-CM | POA: Diagnosis not present

## 2020-11-07 DIAGNOSIS — Z95811 Presence of heart assist device: Secondary | ICD-10-CM | POA: Diagnosis not present

## 2020-11-07 HISTORY — PX: IR US GUIDE VASC ACCESS RIGHT: IMG2390

## 2020-11-07 HISTORY — PX: IR FLUORO GUIDE CV LINE RIGHT: IMG2283

## 2020-11-07 LAB — RENAL FUNCTION PANEL
Albumin: 2.7 g/dL — ABNORMAL LOW (ref 3.5–5.0)
Anion gap: 12 (ref 5–15)
BUN: 57 mg/dL — ABNORMAL HIGH (ref 6–20)
CO2: 28 mmol/L (ref 22–32)
Calcium: 9.3 mg/dL (ref 8.9–10.3)
Chloride: 96 mmol/L — ABNORMAL LOW (ref 98–111)
Creatinine, Ser: 5.85 mg/dL — ABNORMAL HIGH (ref 0.61–1.24)
GFR, Estimated: 10 mL/min — ABNORMAL LOW (ref >60)
Glucose, Bld: 79 mg/dL (ref 70–99)
Phosphorus: 6.9 mg/dL — ABNORMAL HIGH (ref 2.5–4.6)
Potassium: 4.8 mmol/L (ref 3.5–5.1)
Sodium: 136 mmol/L (ref 135–145)

## 2020-11-07 LAB — CBC
HCT: 28.7 % — ABNORMAL LOW (ref 39.0–52.0)
Hemoglobin: 8.8 g/dL — ABNORMAL LOW (ref 13.0–17.0)
MCH: 27 pg (ref 26.0–34.0)
MCHC: 30.7 g/dL (ref 30.0–36.0)
MCV: 88 fL (ref 80.0–100.0)
Platelets: 272 10*3/uL (ref 150–400)
RBC: 3.26 MIL/uL — ABNORMAL LOW (ref 4.22–5.81)
RDW: 17.5 % — ABNORMAL HIGH (ref 11.5–15.5)
WBC: 9.1 10*3/uL (ref 4.0–10.5)
nRBC: 0 % (ref 0.0–0.2)

## 2020-11-07 LAB — GLUCOSE, CAPILLARY
Glucose-Capillary: 78 mg/dL (ref 70–99)
Glucose-Capillary: 125 mg/dL — ABNORMAL HIGH (ref 70–99)
Glucose-Capillary: 101 mg/dL — ABNORMAL HIGH (ref 70–99)
Glucose-Capillary: 93 mg/dL (ref 70–99)
Glucose-Capillary: 101 mg/dL — ABNORMAL HIGH (ref 70–99)

## 2020-11-07 LAB — COOXEMETRY PANEL
Carboxyhemoglobin: 2.1 % — ABNORMAL HIGH (ref 0.5–1.5)
Methemoglobin: 0.7 % (ref 0.0–1.5)
O2 Saturation: 56.7 %
Total hemoglobin: 10.8 g/dL — ABNORMAL LOW (ref 12.0–16.0)

## 2020-11-07 LAB — PROTIME-INR
INR: 2.6 — ABNORMAL HIGH (ref 0.8–1.2)
Prothrombin Time: 26.9 seconds — ABNORMAL HIGH (ref 11.4–15.2)

## 2020-11-07 LAB — MAGNESIUM: Magnesium: 2.2 mg/dL (ref 1.7–2.4)

## 2020-11-07 LAB — VITAMIN C: Vitamin C: 1.9 mg/dL (ref 0.4–2.0)

## 2020-11-07 LAB — LACTATE DEHYDROGENASE: LDH: 209 U/L — ABNORMAL HIGH (ref 98–192)

## 2020-11-07 MED ORDER — WARFARIN SODIUM 1 MG PO TABS: 1.0000 mg | ORAL_TABLET | Freq: Every day | ORAL | Status: DC

## 2020-11-07 MED ORDER — INSULIN GLARGINE 100 UNIT/ML ~~LOC~~ SOLN
15.0000 [IU] | Freq: Every day | SUBCUTANEOUS | Status: DC
  Administered 2020-11-08 – 2020-11-14 (×7): 15 [IU] via SUBCUTANEOUS
  Filled 2020-11-07 (×8): qty 0.15

## 2020-11-07 MED ORDER — LIDOCAINE-EPINEPHRINE 1 %-1:100000 IJ SOLN
INTRAMUSCULAR | Status: AC
  Filled 2020-11-07: qty 1

## 2020-11-07 MED ORDER — COLLAGENASE 250 UNIT/GM EX OINT
TOPICAL_OINTMENT | Freq: Every day | CUTANEOUS | Status: DC
  Filled 2020-11-07 (×2): qty 30

## 2020-11-07 MED ORDER — INSULIN ASPART 100 UNIT/ML ~~LOC~~ SOLN
0.0000 [IU] | Freq: Three times a day (TID) | SUBCUTANEOUS | Status: DC
  Administered 2020-11-07 – 2020-11-09 (×3): 2 [IU] via SUBCUTANEOUS
  Administered 2020-11-10 – 2020-11-12 (×3): 3 [IU] via SUBCUTANEOUS
  Administered 2020-11-12 – 2020-11-13 (×2): 2 [IU] via SUBCUTANEOUS
  Administered 2020-11-13: 3 [IU] via SUBCUTANEOUS
  Administered 2020-11-13: 2 [IU] via SUBCUTANEOUS

## 2020-11-07 MED ORDER — CEFAZOLIN SODIUM-DEXTROSE 2-4 GM/100ML-% IV SOLN
INTRAVENOUS | Status: AC
  Filled 2020-11-07: qty 100

## 2020-11-07 MED ORDER — MIDAZOLAM HCL 2 MG/2ML IJ SOLN
INTRAMUSCULAR | Status: AC | PRN
  Administered 2020-11-07: 0.5 mg via INTRAVENOUS

## 2020-11-07 MED ORDER — MIDAZOLAM HCL 2 MG/2ML IJ SOLN
INTRAMUSCULAR | Status: AC
  Filled 2020-11-07: qty 2

## 2020-11-07 MED ORDER — HEPARIN SODIUM (PORCINE) 1000 UNIT/ML IJ SOLN
INTRAMUSCULAR | Status: AC
  Filled 2020-11-07: qty 1

## 2020-11-07 MED ORDER — LIDOCAINE HCL (PF) 1 % IJ SOLN
INTRAMUSCULAR | Status: AC | PRN
  Administered 2020-11-07: 10 mL

## 2020-11-07 MED ORDER — SODIUM CHLORIDE 0.9 % IV SOLN
INTRAVENOUS | Status: AC | PRN
  Administered 2020-11-07: 10 mL/h via INTRAVENOUS

## 2020-11-07 MED ORDER — LIDOCAINE HCL 1 % IJ SOLN
INTRAMUSCULAR | Status: AC
  Filled 2020-11-07: qty 20

## 2020-11-07 MED ORDER — SENNOSIDES-DOCUSATE SODIUM 8.6-50 MG PO TABS
2.0000 | ORAL_TABLET | Freq: Every day | ORAL | Status: DC
  Administered 2020-11-07 – 2020-11-10 (×2): 2 via ORAL
  Filled 2020-11-07 (×5): qty 2

## 2020-11-07 MED ORDER — SORBITOL 70 % SOLN
30.0000 mL | Status: AC
  Administered 2020-11-07: 30 mL via ORAL
  Filled 2020-11-07: qty 30

## 2020-11-07 MED ORDER — CEFAZOLIN (ANCEF) 1 G IV SOLR: INTRAVENOUS | Status: DC | PRN

## 2020-11-07 MED ORDER — WARFARIN SODIUM 2 MG PO TABS
2.0000 mg | ORAL_TABLET | Freq: Every day | ORAL | Status: DC
  Administered 2020-11-07: 2 mg via ORAL
  Filled 2020-11-07: qty 1

## 2020-11-07 MED ORDER — FENTANYL CITRATE (PF) 100 MCG/2ML IJ SOLN
INTRAMUSCULAR | Status: AC | PRN
  Administered 2020-11-07: 25 ug via INTRAVENOUS

## 2020-11-07 MED ORDER — CEFAZOLIN SODIUM-DEXTROSE 2-4 GM/100ML-% IV SOLN
2.0000 g | Freq: Once | INTRAVENOUS | Status: AC
  Administered 2020-11-07: 2 g via INTRAVENOUS

## 2020-11-07 MED ORDER — FENTANYL CITRATE (PF) 100 MCG/2ML IJ SOLN
INTRAMUSCULAR | Status: AC
  Filled 2020-11-07: qty 2

## 2020-11-07 MED ORDER — FENTANYL CITRATE (PF) 100 MCG/2ML IJ SOLN
12.5000 ug | INTRAMUSCULAR | Status: DC | PRN
  Administered 2020-11-08 – 2020-11-09 (×2): 25 ug via INTRAVENOUS
  Administered 2020-11-11: 12.5 ug via INTRAVENOUS
  Administered 2020-11-14: 25 ug via INTRAVENOUS
  Filled 2020-11-07 (×4): qty 2

## 2020-11-07 MED ORDER — CHLORHEXIDINE GLUCONATE 4 % EX LIQD
CUTANEOUS | Status: AC
  Filled 2020-11-07: qty 15

## 2020-11-07 NOTE — Consult Note (Signed)
[place] Nurse wound follow up Patient receiving care in [place]. Wound type: unstageable to sacrum Measurement: 5.5 cm x 8 cm Wound bed: pink with increased yellowish slough scattered in wound bed Drainage (amount, consistency, odor) serosanginous on existing gauze dressing. Periwound: intact Dressing procedure/placement/frequency: Apply Santyl to sacral wound in a nickel thick layer. Cover with a saline moistened gauze, then dry gauze or ABD pad.  Change daily. PT to perform on days of hydrotherapy. Primary RN to do all others. I have requested PT to consider hydrotherapy for 6 days/week. Monitor the wound area(s) for worsening of condition such as: Signs/symptoms of infection,  Increase in size,  Development of or worsening of odor, Development of pain, or increased pain at the affected locations.  Notify the medical team if any of these develop. Val Riles, RN, MSN, CWOCN, CNS-BC, pager [phone]

## 2020-11-07 NOTE — Progress Notes (Signed)
LVAD Coordinator Rounding Note:  Admitted 09/28/20 due to worsening heart failure symptoms. Recent COVID infection January 2022; did not take medications for 3-4 weeks.   S/p dental extractions 10/07/20 per Dr Benson Norway.  HM III LVAD implanted on 10/13/20 by Dr Cyndia Bent under Destination Therapy criteria.  Pt lying in bed resting upon my arrival. Pt states he was able to walk about 253ft. But had to stop due to "butt pain."  Reports continued weakness/numbness of his right hand. He has ball and putty at bedside, and a list of exercises that he is to complete throughout the day to increase dexterity in his hand. Will need extensive therapy in order to independently perform power source changes/handle VAD equipment. Encouraged patient and bedside RN to complete exercises several times a day to increase hand strength.   Intermittent HD started; tolerated well. Plan for HD again tomorrow. Pt supposed to get  tunneled catheter per nephrology today in IR.  Some urine production yesterday, Oliguric w/ 200 cc in UOP.  C/w iHD.   Vital signs: Temp: 97.8 HR: 84 Doppler: 88 Auto cuff: 95/71 (78) O2 Sat: 99% on RA Wt: 200.1>221.7>219.5...180>175>178>185.8>181>179.9>190.2>185.4>187.6 lbs    LVAD interrogation reveals:  Speed: 5200 Flow: 4.8 Power: 3.7w PI: 2.8 Hematocrit: 28  Alarms: none Events: 3 today  Fixed speed: 5200 Low speed limit: 4900  Drive Line: CDI. Drive line anchor correctly applied. Existing VAD dressing removed and site care performed using sterile technique. Drive line exit site cleaned with Chlora prep applicators x 2,  allowed to dry, Cellurate applied to exit site and gauze dressing with silver strip re-applied; skin tears healing. I was careful not to apply tape over skin tears. NO skin prep used due to skin intolerance of same. Exit site healing and unincorporated the velour is fully implanted at exit site. Suture removed today. Along with "dissolvable" suture that was sticking  out of exit site. Small amount of serous drainage. No redness, tenderness, foul odor or rash noted. Drive line anchor replaced. Continue Monday, Wednesday, Friday dressing changes by VAD coordinator or nurse champion. Next dressing change due 11/09/20.      Labs:  LDH trend: 444>670>575>571.....>310>306>281>259>246>244>245>220>209  INR trend: 1.4>1.7>1.8>1.7>1.6>1.8............2.0>2.2>2.1>1.8>1.9>1.7>2.4>2.8>2.6  Creatinine: 1.84>3.0>3.4>2.73.....>2.6>2.2>1.78>2.68>3.11>6.37>4.32>5.8  Anticoagulation Plan: -INR Goal: 2.0-2.5 -ASA Dose: 81 mg  Blood Products:  IntraOp:   1 Platelet  6 FFP  3 PRBC  1035 cell saver Postop:   10/13/20:   4 FFP  4 PRBC    10/14/20:  1 FFP  1 PRBC   10/16/20:  1 PRBC   10/18/20:  1 PRBC   10/16/20:  1 PRBC   10/18/20:  1 PRBC   10/27/20:  1 PRBC  Device: -  Medtronic single - Therapies: off   Arrythmias: VT in OR requiring defibrillation   Renal: CRRT   - started 10/19/20   - stopped 10/26/20   - re-started 10/27/20  - stopped 10/30/20   iHD started 10/31/20   Infection: - 10/27/20 blood cultures - no growth x 5 days; final   Patient Education: 1. Pt not feeling well today. Reports he is dizzy, tired, and in pain. Education not appropriate at this time 2. Will plan time to continue VAD education next week when [name] and pt's son [name] are available to come to hospital.  3. Patient discharge education binder brought to bedside. Encouraged patient to begin reviewing over the weekend.  Plan/Recommendations:  1. Call VAD coordinator for equipment or drive line issues 2. MWF drive line dressing change per  VAD coordinator or nurse champion. Next dressing change due 11/09/20.   Tanda Rockers RN [place] Coordinator  Office: [phone]  24/7 Pager: [phone]

## 2020-11-07 NOTE — Progress Notes (Signed)
[facility] Progress Note    Assessment/ Plan:   Acute kidney injury on chronic kidney disease stage III: With underlying chronic kidney disease likely from diabetes/hypertension and worsening renal function from hemodynamically mediated ATN and cardiorenal syndrome. CRRT 3/15-3/22, 3/24-27; HD since 3/28. -Unfortunately, no signs of renal recovery at the moment -Plan next HD Tues 11/08/20.  Tolerating ok so far.  - Outpt CLIP underway.  TDC conversion tomorrow.  S/p Vein map - if all moving in right direction poss eval for perm access -strict I/O  Hyponatremia, hypervolemic: now resolved. Cont volume maintenance with HD.   Acute exacerbation of chronic systolic heart failure with cardiogenic shock: LVAD, placed 3/10. Volume status/UF managed with RRT.   Per above UF to euvolemia.  On both midodrine and revatio,  Anemia:Likely secondary to chronic disease and exacerbated by recent surgical losses. Cont ESA qMon.   Transfuse PRBC as needed.  Iron deficient, has been repleted IV.  Atrial fibrillation: per cardiology, on amiodarone  Protein calorie malnutrition, hypoalbuminemia: push protein, per primary  Subjective:    Walking in the halls--> doing well and is motivated for CIR.  Getting TDC hopefully today.   Objective:   BP 95/71 (BP Location: Left Wrist)   Pulse 84   Temp 97.8 F (36.6 C) (Oral)   Resp 17   Ht 5' 10.5" (1.791 m)   Wt 85.1 kg   SpO2 99%   BMI 26.54 kg/m   Intake/Output Summary (Last 24 hours) at 11/07/2020 1045 Last data filed at 11/07/2020 0823 Gross per 24 hour  Intake 360 ml  Output 700 ml  Net -340 ml   Weight change: -3.6 kg  Physical Exam: Gen: NAD, sitting in chair CVS: mechanical hum throughout  Resp: clear  Abd: soft, + drive line Ext: no LE edema, R foot with wound which is dressed  Imaging: No results found.  Labs: BMET Recent Labs  Lab 11/01/20 0513 11/02/20 0517 11/03/20 0540 11/04/20 0334 11/05/20 0500  11/06/20 0500 11/07/20 0923  NA 134* 132* 132* 135 133* 136 136  K 4.1 4.7 5.2* 4.3 4.9 4.6 4.8  CL 98 95* 95* 98 95* 97* 96*  CO2 28 26 25 27 25 28 28   GLUCOSE 86 207* 154* 125* 70 107* 79  BUN 35* 63* 87* 50* 69* 41* 57*  CREATININE 3.11* 4.93* 6.37* 4.32* 5.75* 4.37* 5.85*  CALCIUM 8.7* 9.0 9.2 8.8* 9.2 9.1 9.3  PHOS 2.0* 3.0 4.8* 3.9 6.6* 5.2* 6.9*   CBC Recent Labs  Lab 11/01/20 0521 11/01/20 2003 11/06/20 0500 11/07/20 0444  WBC 11.0* 12.2* 8.3 9.1  HGB 8.4* 8.3* 8.7* 8.8*  HCT 27.1* 27.3* 28.3* 28.7*  MCV 90.3 90.4 89.8 88.0  PLT 279 301 300 272    Medications:    . (feeding supplement) PROSource Plus  30 mL Oral BID BM  . amiodarone  200 mg Oral Daily  . aspirin EC  81 mg Oral Daily  . Chlorhexidine Gluconate Cloth  6 each Topical Daily  . Chlorhexidine Gluconate Cloth  6 each Topical Q0600  . collagenase   Topical Daily  . feeding supplement  237 mL Oral TID BM  . gabapentin  100 mg Oral QHS  . insulin aspart  0-15 Units Subcutaneous TID WC  . insulin aspart  0-5 Units Subcutaneous QHS  . insulin glargine  15 Units Subcutaneous Daily  . midodrine  15 mg Oral TID WC  . multivitamin  1 tablet Oral QHS  . pantoprazole  40 mg Oral Daily  . polyethylene glycol  17 g Oral Daily  . senna-docusate  2 tablet Oral QHS  . sildenafil  20 mg Oral TID  . sodium chloride flush  10-40 mL Intracatheter Q12H  . warfarin  2 mg Oral q1600  . Warfarin - Pharmacist Dosing Inpatient   Does not apply q1600      Madelon Lips, MD 11/07/2020, 10:45 AM

## 2020-11-07 NOTE — Progress Notes (Signed)
CARDIAC REHAB PHASE I   Offered to walk with pt several times this afternoon. Pt with recent session with PT earlier, now going to IR for a procedure. Stressed importance of ambulation and OOB. Will continue to follow and encourage.  Rufina Falco, RN BSN 11/07/2020 3:03 PM

## 2020-11-07 NOTE — Progress Notes (Addendum)
Patient ID: Marcus Trost., male   DOB: 1961/02/03, 60 y.o.   MRN: [id number]     Advanced Heart Failure Rounding Note  PCP-Cardiologist: No primary care provider on file.   Subjective:    - 2/27 Milrinone switched to dobutamine 5 mcg.  - 2/28 Moved to ICU. Dobutamine increased to 7.5 mcg and Norepi 3 mcg added. Diuretics held.  - 3/1 Impella 5.5 placed.  - 3/4 Teeth removed - 3/10 HM3 LVAD placed - 3/13 VAD speed increased to 5500. [name] out  - 3/14 Nephrology consulted. Given 1UPRBCs  - 3/15 Started CRRT. Given 1UPRBCs.  - 3/23 VAD Speed turned down to 5300  - 3/23 CRRT and Milrinone Stopped. Back in Afib - 3/24 CRRT restarted.  - 3/27 CVVH off.  -3/28 iHD -3/29 iHD. Walked 75 feet!! -3/20 Ramp Echo: Fixed speed: 5200 Low speed limit: 4900  Some urine production yesterday, Oliguric w/ 200 cc in UOP.  C/w iHD. Scheduled for tunneled cath placement today.  Feels ok today except blood glucose was low earlier at 78.   Co-ox 57%.    LVAD Interrogation HM 3: Speed: 5200 Flow: 4.5  PI: 4.0  Power: 3.7. VAD interrogated personally. Parameters stable.  Objective:   Weight Range: 85.1 kg Body mass index is 26.54 kg/m.   Vital Signs:   Temp:  [98.2 F (36.8 C)-98.5 F (36.9 C)] 98.4 F (36.9 C) (04/04 0300) Pulse Rate:  [78-110] 110 (04/04 0300) Resp:  [16-20] 17 (04/04 0300) BP: (101-114)/(67-92) 114/67 (04/04 0300) SpO2:  [99 %-100 %] 99 % (04/04 0300) Weight:  [85.1 kg] 85.1 kg (04/04 0300) Last BM Date: 10/31/20  Weight change: Filed Weights   11/05/20 1337 11/06/20 0414 11/07/20 0300  Weight: 88.7 kg 86.5 kg 85.1 kg    Intake/Output:   Intake/Output Summary (Last 24 hours) at 11/07/2020 0718 Last data filed at 11/06/2020 1922 Gross per 24 hour  Intake 520 ml  Output 200 ml  Net 320 ml      Physical Exam   MAPs 70s   General:  Mildly fatigue appearing male, laying in bed. No distress  HEENT: normal  Neck: supple. JVP not elevated. + Rt IJ HD  cath. Carotids 2+ bilat; no bruits. No lymphadenopathy or thryomegaly appreciated. Cor: LVAD hum.  Lungs: Clear bilaterally. No wheezing Abdomen: obese soft, nontender, non-distended. No hepatosplenomegaly. No bruits or masses. Good bowel sounds. Driveline site clean. Anchor in place.  Extremities: no cyanosis, clubbing, rash. Warm and dry no edema  Neuro: alert & oriented x 3. No focal deficits. Moves all 4 without problem    Telemetry   NSR 70s Personally reviewed   Labs    CBC Recent Labs    11/06/20 0500 11/07/20 0444  WBC 8.3 9.1  HGB 8.7* 8.8*  HCT 28.3* 28.7*  MCV 89.8 88.0  PLT 300 409   Basic Metabolic Panel Recent Labs    11/05/20 0500 11/06/20 0500 11/07/20 0444  NA 133* 136  --   K 4.9 4.6  --   CL 95* 97*  --   CO2 25 28  --   GLUCOSE 70 107*  --   BUN 69* 41*  --   CREATININE 5.75* 4.37*  --   CALCIUM 9.2 9.1  --   MG 2.2 2.1 2.2  PHOS 6.6* 5.2*  --    Liver Function Tests Recent Labs    11/05/20 0500 11/06/20 0500  ALBUMIN 2.8* 2.7*   No results for input(s): LIPASE, AMYLASE  in the last 72 hours. Cardiac Enzymes No results for input(s): CKTOTAL, CKMB, CKMBINDEX, TROPONINI in the last 72 hours.  BNP: BNP (last 3 results) Recent Labs    10/20/20 0305 10/27/20 0020 11/02/20 2310  BNP 466.7* 774.9* 486.0*    ProBNP (last 3 results) No results for input(s): PROBNP in the last 8760 hours.   D-Dimer No results for input(s): DDIMER in the last 72 hours. Hemoglobin A1C No results for input(s): HGBA1C in the last 72 hours. Fasting Lipid Panel No results for input(s): CHOL, HDL, LDLCALC, TRIG, CHOLHDL, LDLDIRECT in the last 72 hours. Thyroid Function Tests No results for input(s): TSH, T4TOTAL, T3FREE, THYROIDAB in the last 72 hours.  Invalid input(s): FREET3  Other results:   Imaging    No results found.   Medications:     Scheduled Medications: . (feeding supplement) PROSource Plus  30 mL Oral BID BM  . amiodarone  200  mg Oral Daily  . aspirin EC  81 mg Oral Daily  . Chlorhexidine Gluconate Cloth  6 each Topical Daily  . Chlorhexidine Gluconate Cloth  6 each Topical Q0600  . feeding supplement  237 mL Oral TID BM  . gabapentin  100 mg Oral QHS  . insulin aspart  0-20 Units Subcutaneous TID WC  . insulin aspart  0-5 Units Subcutaneous QHS  . insulin glargine  20 Units Subcutaneous BID  . midodrine  15 mg Oral TID WC  . multivitamin  1 tablet Oral QHS  . pantoprazole  40 mg Oral Daily  . polyethylene glycol  17 g Oral Daily  . senna-docusate  1 tablet Oral QHS  . sildenafil  20 mg Oral TID  . sodium chloride flush  10-40 mL Intracatheter Q12H  . Warfarin - Pharmacist Dosing Inpatient   Does not apply q1600    Infusions: . sodium chloride    . sodium chloride    .  ceFAZolin (ANCEF) IV    . ferric gluconate (FERRLECIT/NULECIT) IV 125 mg (11/05/20 1635)  . lactated ringers Stopped (10/17/20 1655)    PRN Medications: sodium chloride, sodium chloride, acetaminophen, alteplase, dextrose, fentaNYL (SUBLIMAZE) injection, heparin, heparin, lidocaine (PF), lidocaine-prilocaine, ondansetron (ZOFRAN) IV, oxyCODONE, pentafluoroprop-tetrafluoroeth, senna-docusate, sodium chloride flush, traZODone, white petrolatum    Assessment/Plan   1.Acute on chronic systolic HF -> cardiogenic shock:ICM.HasMedtronic ICD. EF 15%. Low output persisted despite milrinone and then dobutamine with rise in creatinine to 2.5. Impella 5.5 placed 3/1.  Creatinine improved to 1.7 and HM3 LVAD was placed on 3/10. Developed post-op renal failure requiring CVVH. Now off milrinone and epinephrine. Last echo showed moderate RV enlargement with mildly decreased RV function and VAD speed decreased to 5300 due to left-sided septum. CRRT stopped 3/23 but restarted 3/24. CVVH stopped again on 3/27. Ramp echo 3/30 with speed decreased to 5200. Co-ox remains marginal at 57%. Tolerating iHD for volume management. Next session on Tuesday   -  Continue midodrine 15 mg tid.  - Continue sildenafil 20 mg tid for RV failure.  2. LVAD: 3/13 VAD speed increased to 5500. 3/23 speed decreased to 5300 and decreased to 5200 on 3/30. VAD interrogated personally. Parameters stable. - LDH 209. INR 2.6 today on warfarin, on ASA 81 daily. Discussed dosing with PharmD personally. - DL site ok 3. CAD s/p [id number]:Last cath in 6/19 with patent grafts.  - Continue Crestor 40 mg daily.  4. AKI on CKD Stage 3b: Suspect that this is a combination of cardiorenal syndrome and diabetic nephropathy and possibly ATN. Developed  post-op AKI. Started on CRRT 3/15. CVVH stopped 3/23 but restarted 3/24.  - Tolerating iHD, some urine production yesterday, 200 cc in UOP  - Plan for tunneled catheter today   5. Atrial flutter/fibrillation: s/p DC-CV 08/21/18. Maintaining NSR.     - Continue po amiodarone but decrease to 200 mg daily.  6. Wound R Foot: Partial thickness skin loss noted. Cover with mepilex border and change every 5 days.  7. Right arm pain: Likely neuropathic from stretch of brachial plexus with Impella placement.  8. ID:  WBCs normal. Afebrile. Blood Cx- NGTD.  9. NO:IBBCWUG of very poor control but hgbA1ctrending down. Most recent 7.7%.  Insulin drip stopped 3/24. Blood glucose levels now running low. Reduce Lantus to 15 once daily and change sliding scale insulin to moderate.   - Diabetes coordinators have seen 10. Anemia: Has been stable. hgb 8.8 11. Deconditioning - PT/OT following, continue aggressive PT/OT efforts - CIR consulted 12. Unstageable Pressure Ulcer on buttokcs.  - On hydrotherapy Mon-Sat.  - Continue reposition every 2 hours side lying position. Reposition frequently in the chair.  Lyda Jester PA-C  11/07/2020 7:18 AM  Patient seen with PA, agree with the above note.   Stable today, still with pain from decubitus ulcer.  Did not walk yesterday.    Co-ox 58%.  Had last HD Saturday.  MAP 70s.   General: Well  appearing this am. NAD.  HEENT: Normal. Neck: Supple, JVP 10 cm. Carotids OK.  Cardiac:  Mechanical heart sounds with LVAD hum present.  Lungs:  CTAB, normal effort.  Abdomen:  NT, ND, no HSM. No bruits or masses. +BS  LVAD exit site: Well-healed and incorporated. Dressing dry and intact. No erythema or drainage. Stabilization device present and accurately applied. Driveline dressing changed daily per sterile technique. Extremities:  Warm and dry. No cyanosis, clubbing, rash, or edema.  Neuro:  Alert & oriented x 3. Cranial nerves grossly intact. Moves all 4 extremities w/o difficulty. Affect pleasant   Skin: Decubitus ulcer  Patient to get [facility] placed today by IR.  Had 200 cc UOP yesterday.  Next HD scheduled to tomorrow.  Can evaluate for AV fistula but can be difficult to get fistulae to mature in LVAD patients due to continuous flow.   INR therapeutic on warfarin.   No BM recently, will give laxatives today.   Hgb 8.8, follow (transfuse < 7).  No overt bleeding.  LDH 209.   Aggressive PT, to CIR hopefully this week.   Loralie Champagne 11/07/2020 7:47 AM

## 2020-11-07 NOTE — Progress Notes (Signed)
Occupational Therapy Treatment Note  Focus of session on RUE neuro rehab. Continues to demonstrate apparent neurapraxia RUE. Son present for session - educated son on RUE A/AA/PROM and positioning of thumb loop and buddy strap to work on functional pinch/use of RUE. Continue to recommend rehab at [facility]. Will continue to follow.     11/07/20 1100  OT Visit Information  Last OT Received On 11/07/20  Assistance Needed +1  History of Present Illness 60 y.o. male admitted 2/28 with ICM for LVAD workup. Impella placed 3/1. Heartmate 3 LVAD placed 3/10. CRRT started 3/15. CRRT held 3/23-3/24 then restarted until 3/27 with iHD started 3/28. PMHx: DM2, CAD s/p CABG 1572, systolic HF due to ischemic cardiomyopathy with EF 20-25% (echo 12/15), DM2 and CKD. He is s/p Medtronic single chamber ICD. Covid 1/22.  Precautions  Precautions Fall;Sternal;Other (comment)  Precaution Comments LVAD, sacral wound; R IJ dialysis catheter, ? left BPPV  Required Braces or Orthoses Other Brace  Other Brace thumb loop and buddy strap R hand  Pain Assessment  Pain Assessment Faces  Faces Pain Scale 6  Pain Location sacrum (R shoulder with ROM @ 90)  Pain Descriptors / Indicators Grimacing;Guarding  Pain Intervention(s) Limited activity within patient's tolerance  Cognition  Arousal/Alertness Awake/alert  Behavior During Therapy WFL for tasks assessed/performed  Overall Cognitive Status Impaired/Different from baseline  Area of Impairment Attention;Awareness;Problem solving  Current Attention Level Selective  Following Commands Follows one step commands consistently  Awareness Emergent  Problem Solving Slow processing  ADL  General ADL Comments focus of session on RUE this date  Bed Mobility  Overal bed mobility Needs Assistance  Bed Mobility Rolling  Rolling Supervision  General bed mobility comments dizziness with rolling L  General Comments  General comments (skin integrity, edema, etc.) son present adn  educated on completing RUE ROM including composite flexion; wrist flexion and terminal elbow extension adn shoulder flexion as tolerated while "moving in the tube"  General Exercises - Upper Extremity  Shoulder Flexion AAROM;AROM;Right;PROM;15 reps;Supine  Elbow Extension AROM;AAROM;PROM;Right;15 reps;Other (comment) (static stretch into extension)  Elbow Flexion AROM;Right;10 reps;Supine  Wrist Flexion AROM;AAROM;Right;10 reps;Sidelying;Supine  Digit Composite Flexion AAROM;AROM;Right;15 reps  Other Exercises  Other Exercises pinch/grip strengthening x 10 - using strap set up - son educated on donning straps. Having pt take lead in educating son/caregiver on how to donn straps in order to work on his pinch and functional use of R hand  Other Exercises scapular depression against minimal resistance x 10 in L sidelying  Other Exercises external rotation A/AAROM - able to achieve neutral position  Other Exercises contract/relax to address increasing ER  OT - End of Session  Activity Tolerance Patient limited by pain;Other (comment) (Had been vomiting earlier/nauseated)  Patient left in bed;with call bell/phone within reach;with family/visitor present  Nurse Communication Other (comment) (use of thumb loop and buddy strap)  OT Assessment/Plan  OT Plan Discharge plan remains appropriate;Frequency remains appropriate  OT Visit Diagnosis Unsteadiness on feet (R26.81);Other abnormalities of gait and mobility (R26.89);Muscle weakness (generalized) (M62.81);Other symptoms and signs involving cognitive function;Dizziness and giddiness (R42);Pain  Pain - Right/Left Right  Pain - part of body Arm  OT Frequency (ACUTE ONLY) Min 3X/week  Recommendations for Other Services Rehab consult  Follow Up Recommendations CIR;Supervision/Assistance - 24 hour  OT Equipment 3 in 1 bedside commode  AM-PAC OT "6 Clicks" Daily Activity Outcome Measure (Version 2)  Help from another person eating meals? 3  Help from  another person taking care of personal  grooming? 3  Help from another person toileting, which includes using toliet, bedpan, or urinal? 2  Help from another person bathing (including washing, rinsing, drying)? 2  Help from another person to put on and taking off regular upper body clothing? 3  Help from another person to put on and taking off regular lower body clothing? 2  6 Click Score 15  OT Goal Progression  Progress towards OT goals Progressing toward goals  Acute Rehab OT Goals  Patient Stated Goal to go home and be with his grandchildren  OT Goal Formulation With patient  Time For Goal Achievement 11/14/20  Potential to Achieve Goals Good  ADL Goals  Pt Will Perform Eating with set-up;with adaptive utensils  Pt Will Perform Grooming with set-up;sitting  Pt Will Transfer to Toilet with supervision;bedside commode;ambulating  Pt/caregiver will Perform Home Exercise Program Increased ROM;Increased strength;Both right and left upper extremity;With minimal assist;With written HEP provided  Additional ADL Goal #1 Pt will be able to recall sternal precautions  Additional ADL Goal #2 Pt will be able to change out his lines from battery<>wall with min A  Additional ADL Goal #3 Pt will demonstrate functional pinch pattern to pick up 10 objects with no drops using thumb loop and buddy strap R hand  OT Time Calculation  OT Start Time (ACUTE ONLY) 1001  OT Stop Time (ACUTE ONLY) 1025  OT Time Calculation (min) 24 min  OT General Charges  $OT Visit 1 Visit  OT Treatments  $Neuromuscular Re-education 8-22 mins  $Therapeutic Exercise 8-22 mins  Maurie Boettcher, OT/L   Acute OT Clinical Specialist Acute Rehabilitation Services Pager [phone] Office [phone]

## 2020-11-07 NOTE — Progress Notes (Addendum)
ANTICOAGULATION CONSULT NOTE - Follow Up Consult  Pharmacy Consult for Warfarin Indication: LVAD  Allergies  Allergen Reactions  . Meclizine Hcl Anaphylaxis and Swelling  . Ivabradine Nausea Only    Patient Measurements: Height: 5' 10.5" (179.1 cm) Weight: 85.1 kg (187 lb 9.8 oz) IBW/kg (Calculated) : 74.15 Heparin Dosing Weight: 89.2 kg  Vital Signs: Temp: 97.8 F (36.6 C) (04/04 0746) Temp Source: Oral (04/04 0746) BP: 95/71 (04/04 0746) Pulse Rate: 84 (04/04 0746)  Labs: Recent Labs    11/05/20 0500 11/06/20 0500 11/07/20 0444 11/07/20 0923  HGB  --  8.7* 8.8*  --   HCT  --  28.3* 28.7*  --   PLT  --  300 272  --   LABPROT 30.3* 30.5* 26.9*  --   INR 3.0* 3.0* 2.6*  --   CREATININE 5.75* 4.37*  --  5.85*    Estimated Creatinine Clearance: 14.3 mL/min (A) (by C-G formula based on SCr of 5.85 mg/dL (H)).   Medical History: Past Medical History:  Diagnosis Date  . AICD (automatic cardioverter/defibrillator) present    Single-chamber  implantable cardiac defibrillator - Medtronic  . Atrial fibrillation ([place])   . Cataract    Mixed form OU  . CHF (congestive heart failure) ([place])   . Chronic kidney disease   . Chronic kidney disease (CKD), stage III (moderate) (HCC)   . Chronic systolic heart failure ([place])    a. Echo 5/13: Mild LVE, mild LVH, EF 10%, anteroseptal, lateral, apical AK, mild MR, mild LAE, moderately reduced RVSF, mild RAE, PASP 60;  b. 07/2014 Echo: EF 20-2%, diff HK, AKI of antsep/apical/mid-apicalinferior, mod reduced RV.  Marland Kitchen Coronary artery disease    a. s/p CABG 2002 b. LHC 5/13:  dLM 80%, LAD subtotally occluded, pCFX occluded, pRCA 50%, mid? Occlusion with high take off of the PDA with 70% multiple lesions-not bypassed and supplies collaterals to LAD, LIMA-IM/ramus ok, S-OM ok, S-PLA branches ok. Medical therapy was recommended  . COVID   . Diabetic retinopathy (HCC)    NPDR OD, PDR OS  . Dyspnea   . Gout    "on daily RX" (01/08/2018)  .  Hypertension   . Hypertensive retinopathy    OU  . Ischemic cardiomyopathy    a. Echo 5/13: Mild LVE, mild LVH, EF 10%, anteroseptal, lateral, apical AK, mild MR, mild LAE, moderately reduced RVSF, mild RAE, PASP 60;  b. 01/2012 s/p MDT [id number] Protecta XT VR AICD;  c. 07/2014 Echo: EF 20-2%, diff HK, AKI of antsep/apical/mid-apicalinferior, mod reduced RV.  Marland Kitchen MRSA (methicillin resistant Staphylococcus aureus)    Status post right foot plantar deep infection with MRSA status post  I&D 10/2008  . Myocardial infarction [facility])    "was told I'd had several before heart OR in 2002" (01/08/2018)  . Noncompliance   . Peripheral neuropathy   . Retinopathy, diabetic, background ([place])   . Syncope   . Type II diabetes mellitus ([place])    requiring insulin   . Vitreous hemorrhage, left (HCC)    and proliferative diabetic retinopathy    Assessment: 60 yo M s/p new LVAD implant with HeartMate III on 3/10. Warfarin started per MD on 3/12, and pharmacy asked to take over warfarin dosing.   INR3> 2.6 at top of goal after lower dose yesterday. He is drinking 1 Ensure daily and eating well - per patient report. CBC stable with Hgb 8s, pltc 300. LDH stable 200s  Goal of Therapy:  INR 2-2.5 Monitor platelets by  anticoagulation protocol: Yes   Plan:  Warfarin 2 mg PO daily Daily PT/INR, CBC, monitor s/sx bleeding  Bonnita Nasuti Pharm.D. CPP, BCPS Clinical Pharmacist [phone] 11/07/2020 10:14 AM    Please check [email or web address] for unit-specific pharmacy phone numbers.

## 2020-11-07 NOTE — Progress Notes (Signed)
CSW met with patient at bedside. Patient lying in bed waiting for procedure. He shared that he is struggling with the wound on his buttocks but feels it is being addressed. Patient shared that he ambulated the hallway and sat in the chair for a bit today. Patient's son visited bedside earlier today and patient grateful for his family and visits. Patient shared he has a busy day tomorrow with therapy and dialysis. CSW provided supportive intervention and encouragement with recovery. CSW will continue to follow through implant hospitalization. Raquel Sarna, [place], [place]

## 2020-11-07 NOTE — Procedures (Signed)
Pre-procedure Diagnosis: ESRD Post-procedure Diagnosis: Same  Successful bedside removal of non-tunneled R IJ approach HD catheter. Successful placement of tunneled HD catheter with tips terminating within the superior aspect of the right atrium.    Complications: None Immediate EBL: Trace  The catheter is ready for immediate use.   Ronny Bacon, MD Pager #: [phone]

## 2020-11-07 NOTE — Progress Notes (Signed)
Nutrition Follow Up  DOCUMENTATION CODES:   Not applicable  INTERVENTION:   No BM x 7 days- strengthen bowel regimen   Continue Ensure Enlive po TID, each supplement provides 350 kcal and 20 grams of protein  ProSource Plus 30 ml BID, each supplement provides 100 kcals and 15 grams protein  Continue rena-vit   NUTRITION DIAGNOSIS:   Increased nutrient needs related to chronic illness (CHF) as evidenced by estimated needs.  Ongoing  GOAL:   Patient will meet greater than or equal to 90% of their needs   Progressing   MONITOR:   PO intake,Supplement acceptance,Labs,Weight trends,Skin,I & O's  REASON FOR ASSESSMENT:   Consult LVAD Eval  ASSESSMENT:   Marcus Johnson. is a 60 y.o. male who has a h/o DM2, CAD s/p CABG 1324, systolic HF due to ischemic cardiomyopathy with EF 20-25% (echo 12/15), DM2 and CKD. He is s/p Medtronic Johnson chamber ICD.Pt admitted with CHF.  3/01- s/p impella  3/04- s/p multiple tooth extraction  3/10- s/p impella removal, LVAD implantation  3/11- extubated  3/14- Cortrak placed, continuous feeds started 3/15- CRRT started 3/22- CRRT stopped 3/24- CRRT restarted  3/27- CRRT stopped 3/28- transitioned to iHD  Continues with iHD. No signs of renal recovery at this time. Plan for [facility] conversion this afternoon. Undergoing hydrotherapy for sacral wound. Appetite stable over the last few days. Last three meal completions charted as 50%, 90%, and 80%. Taking Ensure and Prosource BID. Had small vomiting episode this am due to nausea but feels better with medication.   Admission weight: 97.5 kg  Current weight: 85.1 kg (down from 88.7 kg on 4/2)  UOP: 200 ml x 24 hrs   Vitamin/MIneral Profile:  Folate- 13 (wdl) Vitamin A- 90 (H) Vitamin C- 1.9 (wdl) Zinc- 61 (wdl) Vitamin B12- 695 (wdl)  Medications: SS novolog, lantus, miralax, senokot Labs: Phosphorus 6.9 (H) Cr 5.85- up from yesterday  Diet Order:   Diet Order             Diet NPO time specified Except for: Sips with Meds  Diet effective midnight                 EDUCATION NEEDS:   Education needs have been addressed  Skin:  Skin Assessment: Skin Integrity Issues: Skin Integrity Issues:: Incisions Incisions: chest x3 Stage II: coccyx x3 Other: skin tear R chest, skin tear L arm, abdomen  Last BM:  3/28  Height:   Ht Readings from Last 1 Encounters:  10/21/20 5' 10.5" (1.791 m)    Weight:   Wt Readings from Last 1 Encounters:  11/07/20 85.1 kg    Ideal Body Weight:  76.8 kg  BMI:  Body mass index is 26.54 kg/m.  Estimated Nutritional Needs:   Kcal:  2250-2450 kcal  Protein:  115-130 grams  Fluid:  1000 ml + UOP  Marcus Johnson RD, LDN Clinical Nutrition Pager listed in [place]

## 2020-11-07 NOTE — Progress Notes (Signed)
Physical Therapy Treatment Patient Details Name: Marcus Johnson. MRN: [id number] DOB: 1961/03/28 Today's Date: 11/07/2020    History of Present Illness 60 y.o. male admitted 2/28 with ICM for LVAD workup. Impella placed 3/1. Heartmate 3 LVAD placed 3/10. CRRT started 3/15. CRRT held 3/23-3/24 then restarted until 3/27 with iHD started 3/28. PMHx: DM2, CAD s/p CABG 6812, systolic HF due to ischemic cardiomyopathy with EF 20-25% (echo 12/15), DM2 and CKD. He is s/p Medtronic single chamber ICD. Covid 1/22.    PT Comments    Pt pleasant and reports feeling nauseated this am. In supine with HOB elevated pt received meds from RN then proceeded to vomit only a few minutes later with assist for gown change. Pt with lack of nystagmus with transition to sitting from right side of bed and pt stating today that he is allergic to [place]. Pt with assist for power transition to and from battery but showing slowly increasing awareness of process and sequence of power transition. Pt agreeable to attempting sitting up in chair for limited time and again educated for pressure relief and position changes.   Speed 5250, flow 4.5, power 3.7, PI 3.5 with drop to 2.9 on 1st gait trial with report of dizziness  HR 88   Follow Up Recommendations  Supervision/Assistance - 24 hour;CIR     Equipment Recommendations  3in1 (PT)    Recommendations for Other Services       Precautions / Restrictions Precautions Precautions: Fall;Sternal;Other (comment) Precaution Comments: LVAD, sacral wound; R IJ dialysis catheter, ? left BPPV    Mobility  Bed Mobility Overal bed mobility: Needs Assistance Bed Mobility: Rolling;Sidelying to Sit Rolling: Supervision Sidelying to sit: Min guard       General bed mobility comments: cues for sequence with rolling to right to limit vestibular involvement. side to sit with increased time and cues. No nystagmus with transition to sitting from right     Transfers Overall transfer level: Needs assistance   Transfers: Sit to/from Stand Sit to Stand: Min guard;Min assist         General transfer comment: min assist to stand initial 2 trials, minguard last trial with cues for hand placement and anterior translation  Ambulation/Gait Ambulation/Gait assistance: Min assist;+2 safety/equipment Gait Distance (Feet): 84 Feet Assistive device: None;1 person hand held assist Gait Pattern/deviations: Step-through pattern;Decreased stride length   Gait velocity interpretation: 1.31 - 2.62 ft/sec, indicative of limited community ambulator General Gait Details: pt walked 42', 84', 66', respectively with close chair follow and no UE support or RUE support throughout gait. pt limited by dizziness and fatigue but able to progress overall distance today. Ride back to room in chair   Stairs             Wheelchair Mobility    Modified Rankin (Stroke Patients Only)       Balance Overall balance assessment: Needs assistance   Sitting balance-Leahy Scale: Good Sitting balance - Comments: EOB without assist     Standing balance-Leahy Scale: Fair Standing balance comment: static standing without support, single UE support for gait                            Cognition Arousal/Alertness: Awake/alert Behavior During Therapy: WFL for tasks assessed/performed Overall Cognitive Status: Impaired/Different from baseline Area of Impairment: Attention;Awareness;Problem solving                   Current Attention Level: Selective Memory:  Decreased short-term memory Following Commands: Follows one step commands consistently   Awareness: Emergent Problem Solving: Slow processing General Comments: pt recalling all sternal precautions appropriately, continues to need assist for contents of black bag and power transition      Exercises      General Comments        Pertinent Vitals/Pain Pain Score: 5  Pain Location:  sacrum Pain Descriptors / Indicators: Constant;Discomfort;Grimacing;Guarding Pain Intervention(s): Limited activity within patient's tolerance;Monitored during session;Repositioned    Home Living                      Prior Function            PT Goals (current goals can now be found in the care plan section) Progress towards PT goals: Progressing toward goals    Frequency    Min 4X/week      PT Plan Current plan remains appropriate    Co-evaluation              AM-PAC PT "6 Clicks" Mobility   Outcome Measure  Help needed turning from your back to your side while in a flat bed without using bedrails?: A Little Help needed moving from lying on your back to sitting on the side of a flat bed without using bedrails?: A Little Help needed moving to and from a bed to a chair (including a wheelchair)?: A Little Help needed standing up from a chair using your arms (e.g., wheelchair or bedside chair)?: A Little Help needed to walk in hospital room?: A Lot Help needed climbing 3-5 steps with a railing? : A Lot 6 Click Score: 16    End of Session Equipment Utilized During Treatment: Gait belt Activity Tolerance: Patient tolerated treatment well Patient left: in chair;with call bell/phone within reach;with chair alarm set Nurse Communication: Mobility status;Precautions PT Visit Diagnosis: Other abnormalities of gait and mobility (R26.89);Muscle weakness (generalized) (M62.81);Difficulty in walking, not elsewhere classified (R26.2);Pain     Time: 0805-0850 PT Time Calculation (min) (ACUTE ONLY): 45 min  Charges:  $Gait Training: 8-22 mins $Therapeutic Activity: 23-37 mins                     Ranger Petrich P, PT Acute Rehabilitation Services Pager: [phone] Office: [place] 11/07/2020, 10:20 AM

## 2020-11-07 NOTE — Progress Notes (Signed)
ANTICOAGULATION CONSULT NOTE - Follow Up Consult  Pharmacy Consult for Post- IR intervention - on warfarin Indication: LVAD  Plan:  Per "standard" bleeding risk, okay to get Coumadin dose this afternoon as scheduled.  [place] Crane, Roylene Reason, BCCP Clinical Pharmacist  11/07/2020 4:56 PM   [facility] pharmacy phone numbers are listed on [email or web address]

## 2020-11-07 NOTE — Discharge Instructions (Signed)
[address] Department of Dental Medicine Dr. Debe Coder B. Benson [place], DMD Phone: [phone] Fax: [phone]    MOUTH CARE AFTER SURGERY   FACTS:  Ice used in ice bag helps keep the swelling down, and can help lessen the pain.  It is easier to treat pain BEFORE it happens.  Spitting disturbs the clot and may cause bleeding to start again, or to get worse.  Smoking delays healing and can cause complications.  Sharing prescriptions can be dangerous.  Do not take medications not recently prescribed for you.  Antibiotics may stop birth control pills from working.  Use other means of birth control while on antibiotics.  Warm salt water rinses after the first 24 hours will help lessen the swelling:  Use 1/2 teaspoonful of table salt per oz.of water.  DO NOT:  Do not spit.    Do not drink through a straw.  Strongly advised not to smoke, dip snuff or chew tobacco at least for 3 days.  Do not eat sharp or crunchy foods.  Avoid the area of surgery when chewing.  Do not stop your antibiotics before your instructions say to do so.  Do not eat hot foods until bleeding has stopped.  If you need to, let your food cool down to room temperature.  EXPECT:  Some swelling, especially first 2-3 days.  Soreness or discomfort in varying degrees.  Follow your dentist's instructions about how to handle pain before it starts.  Pinkish saliva or light blood in saliva, or on your pillow in the morning.  This can last around 24 hours.  Bruising inside or outside the mouth.  This may not show up until 2-3 days after surgery.  Don't worry, it will go away in time.  Pieces of "bone" may work themselves loose.  It's OK.  If they bother you, let us know.  WHAT TO DO IMMEDIATELY AFTER SURGERY:  Bite on gauze with steady pressure for 30-45 minutes at a time.  Switch out the gauze after 30-45 minutes for clean gauze, and continue this for 1-2 hours or until bleeding subsides.  Do not chew on the  gauze.  Do not lie down flat.  Raise your head support especially for the first 24 hours.  Apply ice to your face on the side of the surgery.  You may apply it 20 minutes on and a few minutes off.  Ice for 8-12 hours.  You may use ice up to 24 hours.  Before the numbness wears off, take a pain pill as instructed.  Prescription pain medication is not always required.  SWELLING:  Expect swelling for the first couple of days.  It should get better after that.  If swelling increases 3 days or so after surgery, let us know as soon as possible.  FEVER:  Take Tylenol every 4 hours if needed to lower your temperature, especially if it is at 100F or higher.  Drink lots of fluids.  If the fever does not go away, let us know.  BREATHING TROUBLE:  Any unusual difficulty breathing means you have to have someone bring you to the emergency room ASAP.  BLEEDING:  Light oozing is expected for 24 hours or so.  Prop head up with pillows.  Do not spit.  Do not confuse bright red fresh flowing blood with lots of saliva colored with a little bit of blood.  If you notice some bleeding, place gauze or a tea bag where it is bleeding and apply CONSTANT pressure by biting  down for 1 hour.  Avoid talking during this time.  Do not remove the gauze or tea bag during this hour to "check" the bleeding.  If you notice bright RED bleeding FLOWING out of particular area, and filling the floor of your mouth, put a wad of gauze on that area, bite down firmly and constantly.  Call us immediately.  If we're closed, have someone bring you to the emergency room.  ORAL HYGIENE:  Brush your teeth as usual after meals and before bedtime.  Use a soft toothbrush around the area of surgery.  DO NOT AVOID BRUSHING.  Otherwise bacteria(germs) will grow and may delay healing or encourage infection.  Since you cannot spit, just gently rinse and let the water flow out of your mouth.  DO NOT SWISH  HARD.  EATING:  Cool liquids are a good point to start.  Increase to soft foods as tolerated.  PRESCRIPTIONS:  Follow the directions for your prescriptions exactly as written.  If your doctor gave you a narcotic pain medication, do not drive, operate machinery or drink alcohol when on that medication.  QUESTIONS:  Call our office during office hours [phone] or call the Emergency Room at [phone].   Information on my medicine - Coumadin   (Warfarin)  This medication education was reviewed with me or my healthcare representative as part of my discharge preparation.  Why was Coumadin prescribed for you? Coumadin was prescribed for you because you have a blood clot or a medical condition that can cause an increased risk of forming blood clots. Blood clots can cause serious health problems by blocking the flow of blood to the heart, lung, or brain. Coumadin can prevent harmful blood clots from forming. As a reminder your indication for Coumadin is:   Blood Clot Prevention After Heart Pump Surgery  What test will check on my response to Coumadin? While on Coumadin (warfarin) you will need to have an INR test regularly to ensure that your dose is keeping you in the desired range. The INR (international normalized ratio) number is calculated from the result of the laboratory test called prothrombin time (PT).  If an INR APPOINTMENT HAS NOT ALREADY BEEN MADE FOR YOU please schedule an appointment to have this lab work done by your health care provider within 7 days. Your INR goal is a number between:  2 to 2.5.  What  do you need to  know  About  COUMADIN? Take Coumadin (warfarin) exactly as prescribed by your healthcare provider about the same time each day.  DO NOT stop taking without talking to the doctor who prescribed the medication.  Stopping without other blood clot prevention medication to take the place of Coumadin may increase your risk of developing a new clot or stroke.   Get refills before you run out.  What do you do if you miss a dose? If you miss a dose, take it as soon as you remember on the same day then continue your regularly scheduled regimen the next day.  Do not take two doses of Coumadin at the same time.  Important Safety Information A possible side effect of Coumadin (Warfarin) is an increased risk of bleeding. You should call your healthcare provider right away if you experience any of the following: ? Bleeding from an injury or your nose that does not stop. ? Unusual colored urine (red or dark brown) or unusual colored stools (red or black). ? Unusual bruising for unknown reasons. ? A serious fall or  if you hit your head (even if there is no bleeding).  Some foods or medicines interact with Coumadin (warfarin) and might alter your response to warfarin. To help avoid this: ? Eat a balanced diet, maintaining a consistent amount of Vitamin K. ? Notify your provider about major diet changes you plan to make. ? Avoid alcohol or limit your intake to 1 drink for women and 2 drinks for men per day. (1 drink is 5 oz. wine, 12 oz. beer, or 1.5 oz. liquor.)  Make sure that ANY health care provider who prescribes medication for you knows that you are taking Coumadin (warfarin).  Also make sure the healthcare provider who is monitoring your Coumadin knows when you have started a new medication including herbals and non-prescription products.  Coumadin (Warfarin)  Major Drug Interactions  Increased Warfarin Effect Decreased Warfarin Effect  Alcohol (large quantities) Antibiotics (esp. Septra/Bactrim, Flagyl, Cipro) Amiodarone (Cordarone) Aspirin (ASA) Cimetidine (Tagamet) Megestrol (Megace) NSAIDs (ibuprofen, naproxen, etc.) Piroxicam (Feldene) Propafenone (Rythmol SR) Propranolol (Inderal) Isoniazid (INH) Posaconazole (Noxafil) Barbiturates (Phenobarbital) Carbamazepine (Tegretol) Chlordiazepoxide (Librium) Cholestyramine  (Questran) Griseofulvin Oral Contraceptives Rifampin Sucralfate (Carafate) Vitamin K   Coumadin (Warfarin) Major Herbal Interactions  Increased Warfarin Effect Decreased Warfarin Effect  Garlic Ginseng Ginkgo biloba Coenzyme Q10 Green tea St. Johns wort    Coumadin (Warfarin) FOOD Interactions  Eat a consistent number of servings per week of foods HIGH in Vitamin K (1 serving =  cup)  Collards (cooked, or boiled & drained) Kale (cooked, or boiled & drained) Mustard greens (cooked, or boiled & drained) Parsley *serving size only =  cup Spinach (cooked, or boiled & drained) Swiss chard (cooked, or boiled & drained) Turnip greens (cooked, or boiled & drained)  Eat a consistent number of servings per week of foods MEDIUM-HIGH in Vitamin K (1 serving = 1 cup)  Asparagus (cooked, or boiled & drained) Broccoli (cooked, boiled & drained, or raw & chopped) Brussel sprouts (cooked, or boiled & drained) *serving size only =  cup Lettuce, raw (green leaf, endive, romaine) Spinach, raw Turnip greens, raw & chopped   These websites have more information on Coumadin (warfarin):  FailFactory.se; VeganReport.com.au;

## 2020-11-08 DIAGNOSIS — Z95811 Presence of heart assist device: Secondary | ICD-10-CM | POA: Diagnosis not present

## 2020-11-08 DIAGNOSIS — I5043 Acute on chronic combined systolic (congestive) and diastolic (congestive) heart failure: Secondary | ICD-10-CM | POA: Diagnosis not present

## 2020-11-08 LAB — RENAL FUNCTION PANEL
Albumin: 2.7 g/dL — ABNORMAL LOW (ref 3.5–5.0)
Anion gap: 13 (ref 5–15)
BUN: 70 mg/dL — ABNORMAL HIGH (ref 6–20)
CO2: 26 mmol/L (ref 22–32)
Calcium: 9.1 mg/dL (ref 8.9–10.3)
Chloride: 97 mmol/L — ABNORMAL LOW (ref 98–111)
Creatinine, Ser: 6.75 mg/dL — ABNORMAL HIGH (ref 0.61–1.24)
GFR, Estimated: 9 mL/min — ABNORMAL LOW (ref >60)
Glucose, Bld: 110 mg/dL — ABNORMAL HIGH (ref 70–99)
Phosphorus: 7.5 mg/dL — ABNORMAL HIGH (ref 2.5–4.6)
Potassium: 5.5 mmol/L — ABNORMAL HIGH (ref 3.5–5.1)
Sodium: 136 mmol/L (ref 135–145)

## 2020-11-08 LAB — CBC
HCT: 28.1 % — ABNORMAL LOW (ref 39.0–52.0)
Hemoglobin: 8.7 g/dL — ABNORMAL LOW (ref 13.0–17.0)
MCH: 27.9 pg (ref 26.0–34.0)
MCHC: 31 g/dL (ref 30.0–36.0)
MCV: 90.1 fL (ref 80.0–100.0)
Platelets: 266 10*3/uL (ref 150–400)
RBC: 3.12 MIL/uL — ABNORMAL LOW (ref 4.22–5.81)
RDW: 17.9 % — ABNORMAL HIGH (ref 11.5–15.5)
WBC: 8.2 10*3/uL (ref 4.0–10.5)
nRBC: 0 % (ref 0.0–0.2)

## 2020-11-08 LAB — MAGNESIUM: Magnesium: 2.4 mg/dL (ref 1.7–2.4)

## 2020-11-08 LAB — PROTIME-INR
INR: 3 — ABNORMAL HIGH (ref 0.8–1.2)
Prothrombin Time: 30.3 seconds — ABNORMAL HIGH (ref 11.4–15.2)

## 2020-11-08 LAB — GLUCOSE, CAPILLARY
Glucose-Capillary: 104 mg/dL — ABNORMAL HIGH (ref 70–99)
Glucose-Capillary: 133 mg/dL — ABNORMAL HIGH (ref 70–99)
Glucose-Capillary: 100 mg/dL — ABNORMAL HIGH (ref 70–99)

## 2020-11-08 LAB — COOXEMETRY PANEL
Carboxyhemoglobin: 2.1 % — ABNORMAL HIGH (ref 0.5–1.5)
Methemoglobin: 0.6 % (ref 0.0–1.5)
O2 Saturation: 51.9 %
Total hemoglobin: 8.9 g/dL — ABNORMAL LOW (ref 12.0–16.0)

## 2020-11-08 LAB — LACTATE DEHYDROGENASE: LDH: 198 U/L — ABNORMAL HIGH (ref 98–192)

## 2020-11-08 MED ORDER — DARBEPOETIN ALFA 100 MCG/0.5ML IJ SOSY: 100.0000 ug | PREFILLED_SYRINGE | INTRAMUSCULAR | Status: DC

## 2020-11-08 MED ORDER — SCOPOLAMINE 1 MG/3DAYS TD PT72
1.0000 | MEDICATED_PATCH | TRANSDERMAL | Status: DC
  Administered 2020-11-08: 1.5 mg via TRANSDERMAL
  Filled 2020-11-08: qty 1

## 2020-11-08 MED ORDER — POLYETHYLENE GLYCOL 3350 17 G PO PACK
17.0000 g | PACK | Freq: Two times a day (BID) | ORAL | Status: DC
  Administered 2020-11-10 – 2020-11-14 (×5): 17 g via ORAL
  Filled 2020-11-08 (×7): qty 1

## 2020-11-08 MED ORDER — SORBITOL 70 % SOLN
30.0000 mL | Freq: Once | Status: AC
  Administered 2020-11-08: 30 mL via ORAL
  Filled 2020-11-08: qty 30

## 2020-11-08 MED ORDER — HEPARIN SODIUM (PORCINE) 1000 UNIT/ML IJ SOLN
INTRAMUSCULAR | Status: AC
  Filled 2020-11-08: qty 4

## 2020-11-08 MED ORDER — WARFARIN 0.5 MG HALF TABLET
0.5000 mg | ORAL_TABLET | Freq: Once | ORAL | Status: AC
  Administered 2020-11-08: 0.5 mg via ORAL
  Filled 2020-11-08: qty 1

## 2020-11-08 MED ORDER — ROSUVASTATIN CALCIUM 20 MG PO TABS
20.0000 mg | ORAL_TABLET | Freq: Every day | ORAL | Status: DC
  Administered 2020-11-08 – 2020-11-14 (×7): 20 mg via ORAL
  Filled 2020-11-08 (×7): qty 1

## 2020-11-08 NOTE — Progress Notes (Signed)
Renal Navigator has requested an update from [facility] Admissions regarding outpatient HD referral for AKI.  Navigator met with patient at bedside to introduce self and let him know that his outpatient HD referral is being worked on and that we will ensure that all staff in the outpatient clinic are properly trained to care for him and his LVAD, which can take some time before he is accepted for outpatient HD care. Patient is very [employer] attorney. Navigator will continue to follow closely and will address transportation needs at a later time.  Marcus Johnson, [place] Renal Navigator  [phone]

## 2020-11-08 NOTE — Progress Notes (Signed)
Physical Therapy Treatment Patient Details Name: Marcus Johnson. MRN: [id number] DOB: [birth year] Today's Date: 11/08/2020    History of Present Illness 60 y.o. male admitted 2/28 with ICM for LVAD workup. Impella placed 3/1. Heartmate 3 LVAD placed 3/10. CRRT started 3/15. CRRT held 3/23-3/24 then restarted until 3/27 with iHD started 3/28. PMHx: DM2, CAD s/p CABG 2458, systolic HF due to ischemic cardiomyopathy with EF 20-25% (echo 12/15), DM2 and CKD. He is s/p [employer] single chamber ICD. Covid 1/22.    PT Comments    Pt very pleasant and reports lack of sleep as well as did not eat yesterday or much at all this am. Pt with limited tolerance for gait and activity today with dizziness without vomiting and return of his jerking in standing with reports of dizziness. Pt with PI drop from 3.3 to 2.8 with significant dizziness in bed and maintained PI during events in standing. Pt declined OOB to chair today due to fatigue. Pt educated for all LVAD parts with total assist for all power transitions and equipment management today.   Speed 5200, flow 4.5, power 3.6, PI 3.3 >2.8 HR 92   Follow Up Recommendations  Supervision/Assistance - 24 hour;CIR     Equipment Recommendations  3in1 (PT)    Recommendations for Other Services       Precautions / Restrictions Precautions Precautions: Fall;Sternal;Other (comment) Precaution Comments: LVAD, sacral wound; ? left BPPV Other Brace: thumb loop and buddy strap R hand Restrictions Weight Bearing Restrictions: No    Mobility  Bed Mobility Overal bed mobility: Needs Assistance Bed Mobility: Rolling;Sidelying to Sit;Sit to Sidelying Rolling: Min assist Sidelying to sit: Min assist     Sit to sidelying: Min assist General bed mobility comments: pt with assist to roll to right today with pt noting spinning and jerking without nystagmus with drop in PI from 3.3 ot 2.8 during this event grossly 30 sec. Spinning cleared then pt able to  transition into sitting. Return to bed to right sidelying brief <5 sec spinning without nystagmus    Transfers Overall transfer level: Needs assistance   Transfers: Sit to/from Stand Sit to Stand: Min guard;Min assist         General transfer comment: min assist on initial stand from bed, repeated additional 3 trials with minguard for hand placement  Ambulation/Gait Ambulation/Gait assistance: Min assist;+2 safety/equipment Gait Distance (Feet): 60 Feet Assistive device: 1 person hand held assist Gait Pattern/deviations: Step-through pattern;Decreased stride length   Gait velocity interpretation: <1.8 ft/sec, indicate of risk for recurrent falls General Gait Details: RUE support with close chair follow. Pt walked 6', 20' 60' with close chair follow and each gait trial limited by dizziness. Initial gait trial with pt also exhibiting jerking.   Stairs             Wheelchair Mobility    Modified Rankin (Stroke Patients Only)       Balance Overall balance assessment: Needs assistance Sitting-balance support: No upper extremity supported;Feet supported Sitting balance-Leahy Scale: Good Sitting balance - Comments: EOB without assist   Standing balance support: Single extremity supported Standing balance-Leahy Scale: Poor Standing balance comment: single UE support in standing                            Cognition Arousal/Alertness: Awake/alert Behavior During Therapy: Flat affect Overall Cognitive Status: Impaired/Different from baseline Area of Impairment: Attention;Awareness;Problem solving  Current Attention Level: Selective   Following Commands: Follows one step commands consistently     Problem Solving: Slow processing General Comments: pt recalling all sternal precautions appropriately, continues to need assist for contents of black bag and power transition      Exercises      General Comments        Pertinent  Vitals/Pain Pain Score: 5  Pain Location: sacrum Pain Descriptors / Indicators: Grimacing;Guarding Pain Intervention(s): Limited activity within patient's tolerance;Monitored during session;Repositioned    Home Living                      Prior Function            PT Goals (current goals can now be found in the care plan section) Acute Rehab PT Goals Time For Goal Achievement: 11/22/20 Progress towards PT goals: Progressing toward goals    Frequency    Min 4X/week      PT Plan Current plan remains appropriate    Co-evaluation              AM-PAC PT "6 Clicks" Mobility   Outcome Measure  Help needed turning from your back to your side while in a flat bed without using bedrails?: A Little Help needed moving from lying on your back to sitting on the side of a flat bed without using bedrails?: A Little Help needed moving to and from a bed to a chair (including a wheelchair)?: A Little Help needed standing up from a chair using your arms (e.g., wheelchair or bedside chair)?: A Little Help needed to walk in hospital room?: A Lot Help needed climbing 3-5 steps with a railing? : Total 6 Click Score: 15    End of Session Equipment Utilized During Treatment: Gait belt Activity Tolerance: Patient limited by fatigue Patient left: in bed;with call bell/phone within reach Nurse Communication: Mobility status;Precautions PT Visit Diagnosis: Other abnormalities of gait and mobility (R26.89);Muscle weakness (generalized) (M62.81);Difficulty in walking, not elsewhere classified (R26.2);Pain     Time: 0800-0840 PT Time Calculation (min) (ACUTE ONLY): 40 min  Charges:  $Gait Training: 8-22 mins $Therapeutic Activity: 23-37 mins                     Shantea Poulton P, PT Acute Rehabilitation Services Pager: [phone] Office: [place] 11/08/2020, 10:06 AM

## 2020-11-08 NOTE — Progress Notes (Signed)
Physical Therapy Wound Treatment Patient Details  Name: Marcus Johnson. MRN: [id number] Date of Birth: Mar 20, 1961  Today's Date: 11/08/2020 Time: 1115-1200 Time Calculation (min): 45 min  Subjective  Subjective Assessment Subjective: Pt pleasant and agreeable to hydrotherapy. Appreciated explanation for return of hydrotherapy. Patient and Family Stated Goals: did not state Prior Treatments: dressing changes, prior hydrotherapy  Pain Score:  Pt premedicated and tolerated treatment well with minimal complaints of pain.   Wound Assessment  Pressure Injury 10/16/20 Coccyx Mid Stage 2 -  Partial thickness loss of dermis presenting as a shallow open injury with a red, pink wound bed without slough. Blister that has drained, 2 cm X 4.5 cm (Active)  Wound Image   11/08/20 1207  Dressing Type ABD;Barrier Film (skin prep);Gauze (Comment);Moist to moist;Santyl 11/08/20 1207  Dressing Changed;Clean;Dry;Intact 11/08/20 1207  Dressing Change Frequency Daily 11/08/20 1207  State of Healing Early/partial granulation 11/08/20 1207  Site / Wound Assessment Red;Yellow 11/08/20 1207  % Wound base Red or Granulating 80% 11/08/20 1207  % Wound base Yellow/Fibrinous Exudate 20% 11/08/20 1207  % Wound base Black/Eschar 0% 11/08/20 1207  % Wound base Other/Granulation Tissue (Comment) 0% 11/08/20 1207  Peri-wound Assessment Intact 11/08/20 1207  Wound Length (cm) 6 cm 11/08/20 1132  Wound Width (cm) 7.3 cm 11/08/20 1132  Wound Depth (cm) 0.1 cm 11/08/20 1132  Wound Surface Area (cm^2) 43.8 cm^2 11/08/20 1132  Wound Volume (cm^3) 4.38 cm^3 11/08/20 1132  Tunneling (cm) 0 11/08/20 1207  Undermining (cm) 0 11/08/20 1207  Margins Unattached edges (unapproximated) 11/08/20 1207  Drainage Amount Minimal 11/08/20 1207  Drainage Description Serosanguineous 11/08/20 1207  Treatment Debridement (Selective);Hydrotherapy (Pulse lavage);Packing (Saline gauze) 11/08/20 1207      Hydrotherapy Pulsed lavage  therapy - wound location: coccyx Pulsed Lavage with Suction (psi): 8 psi Pulsed Lavage with Suction - Normal Saline Used: 1000 mL Pulsed Lavage Tip: Tip with splash shield Selective Debridement Selective Debridement - Location: coccyx Selective Debridement - Tools Used: Forceps,Scalpel,Scissors Selective Debridement - Tissue Removed: yellow slough    Wound Assessment and Plan  Wound Therapy - Assess/Plan/Recommendations Wound Therapy - Clinical Statement: Hydrotherapy reconsulted by [place] RN. Wound appears largely unchanged, with improved appearance of pink/red tissue, and slough that is progressively softening. This patient will benefit from continued hydrotherapy on a short term basis for selective removal of unviable tissue, to decrease bioburden and promote wound bed healing. Will continue to assess daily for most appropriate plan of care. Wound Therapy - Functional Problem List: decreased mobility Factors Delaying/Impairing Wound Healing: Immobility,Multiple medical problems Hydrotherapy Plan: Debridement,Dressing change,Patient/family education,Pulsatile lavage with suction Wound Therapy - Frequency: 6X / week Wound Therapy - Follow Up Recommendations: dressing changes by RN  Wound Therapy Goals- Improve the function of patient's integumentary system by progressing the wound(s) through the phases of wound healing (inflammation - proliferation - remodeling) by: Wound Therapy Goals - Improve the function of patient's integumentary system by progressing the wound(s) through the phases of wound healing by: Decrease Necrotic Tissue to: 0 Decrease Necrotic Tissue - Progress: Goal set today Increase Granulation Tissue to: 100 Increase Granulation Tissue - Progress: Goal set today Goals/treatment plan/discharge plan were made with and agreed upon by patient/family: Yes Time For Goal Achievement: 7 days Wound Therapy - Potential for Goals: Good  Goals will be updated until maximal potential  achieved or discharge criteria met.  Discharge criteria: when goals achieved, discharge from hospital, MD decision/surgical intervention, no progress towards goals, refusal/missing three consecutive treatments without notification  or medical reason.  GP     Charges PT Wound Care Charges $Wound Debridement up to 20 cm: < or equal to 20 cm $PT Hydrotherapy Dressing: 1 dressing $PT PLS Gun and Tip: 1 Supply $PT Hydrotherapy Visit: 1 Visit       Thelma Comp 11/08/2020, 12:22 PM   Rolinda Roan, PT, DPT Acute Rehabilitation Services Pager: [phone] Office: [phone]

## 2020-11-08 NOTE — Progress Notes (Addendum)
Patient ID: Marcus Mitchum., male   DOB: April 27, 1961, 60 y.o.   MRN: [id number]     Advanced Heart Failure Rounding Note  PCP-Cardiologist: No primary care provider on file.   Subjective:    - 2/27 Milrinone switched to dobutamine 5 mcg.  - 2/28 Moved to ICU. Dobutamine increased to 7.5 mcg and Norepi 3 mcg added. Diuretics held.  - 3/1 Impella 5.5 placed.  - 3/4 Teeth removed - 3/10 HM3 LVAD placed - 3/13 VAD speed increased to 5500. [name] out  - 3/14 Nephrology consulted. Given 1UPRBCs  - 3/15 Started CRRT. Given 1UPRBCs.  - 3/23 VAD Speed turned down to 5300  - 3/23 CRRT and Milrinone Stopped. Back in Afib - 3/24 CRRT restarted.  - 3/27 CVVH off.  -3/28 iHD -3/29 iHD. Walked 75 feet!! -3/20 Ramp Echo: Fixed speed: 5200 Low speed limit: 4900 -4/3 S/P RIJ HD catheter.   Co-ox 52%.    Unable to stand this morning due to dizziness. Complaining of nausea.   LVAD Interrogation HM 3: Speed: 5200 Flow: 4.8  PI: 2.9   Power: 4. VAD interrogated personally. Parameters stable.  Objective:   Weight Range: 85.1 kg Body mass index is 26.54 kg/m.   Vital Signs:   Temp:  [97.8 F (36.6 C)-98.6 F (37 C)] 98.5 F (36.9 C) (04/05 0400) Pulse Rate:  [80-92] 88 (04/05 0400) Resp:  [14-21] 20 (04/05 0003) BP: (91-120)/(54-92) 102/81 (04/05 0400) SpO2:  [92 %-99 %] 93 % (04/05 0400) Last BM Date: 10/31/20  Weight change: Filed Weights   11/05/20 1337 11/06/20 0414 11/07/20 0300  Weight: 88.7 kg 86.5 kg 85.1 kg    Intake/Output:   Intake/Output Summary (Last 24 hours) at 11/08/2020 0715 Last data filed at 11/07/2020 1800 Gross per 24 hour  Intake 228.83 ml  Output 500 ml  Net -271.17 ml      Physical Exam   MAPs 80s  Physical Exam: GENERAL: No acute distress. HEENT: normal  NECK: Supple, JVP  8-9 .  2+ bilaterally, no bruits.  No lymphadenopathy or thyromegaly appreciated.   CARDIAC:  Mechanical heart sounds with LVAD hum present. Tunneled HD catheter LUNGS:   Clear to auscultation bilaterally.  ABDOMEN:  Soft, round, nontender, positive bowel sounds x4.     LVAD exit site:   Dressing dry and intact.  No erythema or drainage.  Stabilization device present and accurately applied.  Driveline dressing is being changed daily per sterile technique. EXTREMITIES:  Warm and dry, no cyanosis, clubbing, rash or edema . RUE PICC NEUROLOGIC:  Alert and oriented x 3.    No aphasia.  No dysarthria.  Affect pleasant.      Telemetry   NSR 70s -80s personally reviewed.    Labs    CBC Recent Labs    11/07/20 0444 11/08/20 0420  WBC 9.1 8.2  HGB 8.8* 8.7*  HCT 28.7* 28.1*  MCV 88.0 90.1  PLT 272 803   Basic Metabolic Panel Recent Labs    11/07/20 0444 11/07/20 0923 11/08/20 0420  NA  --  136 136  K  --  4.8 5.5*  CL  --  96* 97*  CO2  --  28 26  GLUCOSE  --  79 110*  BUN  --  57* 70*  CREATININE  --  5.85* 6.75*  CALCIUM  --  9.3 9.1  MG 2.2  --  2.4  PHOS  --  6.9* 7.5*   Liver Function Tests Recent Labs  11/07/20 0923 11/08/20 0420  ALBUMIN 2.7* 2.7*   No results for input(s): LIPASE, AMYLASE in the last 72 hours. Cardiac Enzymes No results for input(s): CKTOTAL, CKMB, CKMBINDEX, TROPONINI in the last 72 hours.  BNP: BNP (last 3 results) Recent Labs    10/20/20 0305 10/27/20 0020 11/02/20 2310  BNP 466.7* 774.9* 486.0*    ProBNP (last 3 results) No results for input(s): PROBNP in the last 8760 hours.   D-Dimer No results for input(s): DDIMER in the last 72 hours. Hemoglobin A1C No results for input(s): HGBA1C in the last 72 hours. Fasting Lipid Panel No results for input(s): CHOL, HDL, LDLCALC, TRIG, CHOLHDL, LDLDIRECT in the last 72 hours. Thyroid Function Tests No results for input(s): TSH, T4TOTAL, T3FREE, THYROIDAB in the last 72 hours.  Invalid input(s): FREET3  Other results:   Imaging    IR Fluoro Guide CV Line Right  Result Date: 11/07/2020 INDICATION: End-stage renal disease. In need of durable  intravenous access for continuation of hemodialysis. EXAM: 1. BEDSIDE REMOVAL OF PRE-EXISTING NON TUNNELED TEMPORARY RIGHT INTERNAL JUGULAR APPROACH DIALYSIS CATHETER. 2. TUNNELED CENTRAL VENOUS HEMODIALYSIS CATHETER PLACEMENT WITH ULTRASOUND AND FLUOROSCOPIC GUIDANCE MEDICATIONS: Ancef 2 gm IV . The antibiotic was given in an appropriate time interval prior to skin puncture. ANESTHESIA/SEDATION: Moderate (conscious) sedation was employed during this procedure. A total of Versed 0.5 mg and Fentanyl 25 mcg was administered intravenously. Moderate Sedation Time: 14 minutes. The patient's level of consciousness and vital signs were monitored continuously by radiology nursing throughout the procedure under my direct supervision. FLUOROSCOPY TIME:  24 seconds (5 mGy) COMPLICATIONS: None immediate. PROCEDURE: Informed written consent was obtained from the patient after a discussion of the risks, benefits, and alternatives to treatment. Questions regarding the procedure were encouraged and answered. Sonographic evaluation was performed of the right internal jugular vein demonstrating wide patency despite the presence of a pre-existing non tunneled hemodialysis catheter. As the access site of the non tunneled hemodialysis catheter was deemed to be slightly superiorly positioned, the temporary catheter was removed and hemostasis was achieved with manual compression. Next, the right neck and chest were prepped with chlorhexidine in a sterile fashion, and a sterile drape was applied covering the operative field. Maximum barrier sterile technique with sterile gowns and gloves were used for the procedure. A timeout was performed prior to the initiation of the procedure. After creating a small venotomy incision, a micropuncture kit was utilized to access the internal jugular vein. Real-time ultrasound guidance was utilized for vascular access including the acquisition of a permanent ultrasound image documenting patency of the  accessed vessel. The microwire was utilized to measure appropriate catheter length. A stiff Glidewire was advanced to the level of the IVC and the micropuncture sheath was exchanged for a peel-away sheath. A palindrome tunneled hemodialysis catheter measuring 23 cm from tip to cuff was tunneled in a retrograde fashion from the anterior chest wall to the venotomy incision (note, a 19 cm tip to cuff catheter was not available at the time of placement as they are currently on back order). The catheter was then placed through the peel-away sheath with tips ultimately positioned within the superior aspect of the right atrium. Final catheter positioning was confirmed and documented with a spot radiographic image. The catheter aspirates and flushes normally. The catheter was flushed with appropriate volume heparin dwells. The catheter exit site was secured with a 0-Prolene retention suture. The venotomy incision was closed with Dermabond and Steri-strips. Dressings were applied. The patient tolerated the  procedure well without immediate post procedural complication. IMPRESSION: Successful placement of 23 cm tip to cuff tunneled hemodialysis catheter via the right internal jugular vein with tips terminating within the superior aspect of the right atrium. The catheter is ready for immediate use. Electronically Signed   By: Sandi Mariscal M.D.   On: 11/07/2020 16:41   IR US Guide Vasc Access Right  Result Date: 11/07/2020 INDICATION: End-stage renal disease. In need of durable intravenous access for continuation of hemodialysis. EXAM: 1. BEDSIDE REMOVAL OF PRE-EXISTING NON TUNNELED TEMPORARY RIGHT INTERNAL JUGULAR APPROACH DIALYSIS CATHETER. 2. TUNNELED CENTRAL VENOUS HEMODIALYSIS CATHETER PLACEMENT WITH ULTRASOUND AND FLUOROSCOPIC GUIDANCE MEDICATIONS: Ancef 2 gm IV . The antibiotic was given in an appropriate time interval prior to skin puncture. ANESTHESIA/SEDATION: Moderate (conscious) sedation was employed during this  procedure. A total of Versed 0.5 mg and Fentanyl 25 mcg was administered intravenously. Moderate Sedation Time: 14 minutes. The patient's level of consciousness and vital signs were monitored continuously by radiology nursing throughout the procedure under my direct supervision. FLUOROSCOPY TIME:  24 seconds (5 mGy) COMPLICATIONS: None immediate. PROCEDURE: Informed written consent was obtained from the patient after a discussion of the risks, benefits, and alternatives to treatment. Questions regarding the procedure were encouraged and answered. Sonographic evaluation was performed of the right internal jugular vein demonstrating wide patency despite the presence of a pre-existing non tunneled hemodialysis catheter. As the access site of the non tunneled hemodialysis catheter was deemed to be slightly superiorly positioned, the temporary catheter was removed and hemostasis was achieved with manual compression. Next, the right neck and chest were prepped with chlorhexidine in a sterile fashion, and a sterile drape was applied covering the operative field. Maximum barrier sterile technique with sterile gowns and gloves were used for the procedure. A timeout was performed prior to the initiation of the procedure. After creating a small venotomy incision, a micropuncture kit was utilized to access the internal jugular vein. Real-time ultrasound guidance was utilized for vascular access including the acquisition of a permanent ultrasound image documenting patency of the accessed vessel. The microwire was utilized to measure appropriate catheter length. A stiff Glidewire was advanced to the level of the IVC and the micropuncture sheath was exchanged for a peel-away sheath. A palindrome tunneled hemodialysis catheter measuring 23 cm from tip to cuff was tunneled in a retrograde fashion from the anterior chest wall to the venotomy incision (note, a 19 cm tip to cuff catheter was not available at the time of placement as  they are currently on back order). The catheter was then placed through the peel-away sheath with tips ultimately positioned within the superior aspect of the right atrium. Final catheter positioning was confirmed and documented with a spot radiographic image. The catheter aspirates and flushes normally. The catheter was flushed with appropriate volume heparin dwells. The catheter exit site was secured with a 0-Prolene retention suture. The venotomy incision was closed with Dermabond and Steri-strips. Dressings were applied. The patient tolerated the procedure well without immediate post procedural complication. IMPRESSION: Successful placement of 23 cm tip to cuff tunneled hemodialysis catheter via the right internal jugular vein with tips terminating within the superior aspect of the right atrium. The catheter is ready for immediate use. Electronically Signed   By: Sandi Mariscal M.D.   On: 11/07/2020 16:41     Medications:     Scheduled Medications: . (feeding supplement) PROSource Plus  30 mL Oral BID BM  . amiodarone  200 mg Oral Daily  .  aspirin EC  81 mg Oral Daily  . Chlorhexidine Gluconate Cloth  6 each Topical Daily  . Chlorhexidine Gluconate Cloth  6 each Topical Q0600  . collagenase   Topical Daily  . feeding supplement  237 mL Oral TID BM  . gabapentin  100 mg Oral QHS  . insulin aspart  0-15 Units Subcutaneous TID WC  . insulin aspart  0-5 Units Subcutaneous QHS  . insulin glargine  15 Units Subcutaneous Daily  . midodrine  15 mg Oral TID WC  . multivitamin  1 tablet Oral QHS  . pantoprazole  40 mg Oral Daily  . polyethylene glycol  17 g Oral Daily  . senna-docusate  2 tablet Oral QHS  . sildenafil  20 mg Oral TID  . sodium chloride flush  10-40 mL Intracatheter Q12H  . warfarin  2 mg Oral q1600  . Warfarin - Pharmacist Dosing Inpatient   Does not apply q1600    Infusions: . sodium chloride    . sodium chloride    . ferric gluconate (FERRLECIT/NULECIT) IV 125 mg (11/05/20  1635)  . lactated ringers Stopped (10/17/20 1655)    PRN Medications: sodium chloride, sodium chloride, acetaminophen, alteplase, dextrose, fentaNYL (SUBLIMAZE) injection, heparin, heparin, lidocaine (PF), lidocaine-prilocaine, ondansetron (ZOFRAN) IV, oxyCODONE, pentafluoroprop-tetrafluoroeth, senna-docusate, sodium chloride flush, traZODone, white petrolatum    Assessment/Plan   1.Acute on chronic systolic HF -> cardiogenic shock:ICM.Has[employer]tr[employer] ICD. EF 15%. Low output persisted despite milrinone and then dobutamine with rise in creatinine to 2.5. Impella 5.5 placed 3/1.  Creatinine improved to 1.7 and HM3 LVAD was placed on 3/10. Developed post-op renal failure requiring CVVH. Now off milrinone and epinephrine. Last echo showed moderate RV enlargement with mildly decreased RV function and VAD speed decreased to 5300 due to left-sided septum. CRRT stopped 3/23 but restarted 3/24. CVVH stopped again on 3/27. Ramp echo 3/30 with speed decreased to 5200. Co-ox remains marginal at 52%. Tolerating iHD for volume management. - Continue midodrine 15 mg tid.  - Having ongoing dizziness when standing. May need to stop sildenafil 20 mg tid for RV failure.  2. LVAD: 3/13 VAD speed increased to 5500. 3/23 speed decreased to 5300 and decreased to 5200 on 3/30. VAD interrogated personally. Parameters stable. - LDH 198  INR 3 today on warfarin, on ASA 81 daily. Discussed dosing with PharmD personally. - DL site ok 3. CAD s/p [id number]:Last cath in 6/19 with patent grafts.  - Continue Crestor 40 mg daily.  4. AKI on CKD Stage 3b: Suspect that this is a combination of cardiorenal syndrome and diabetic nephropathy and possibly ATN. Developed post-op AKI. Started on CRRT 3/15. CVVH stopped 3/23 but restarted 3/24.  - Tolerating iHD. Plan for another session today.  -S/P tunneled HD catheter.    5. Atrial flutter/fibrillation: s/p DC-CV 08/21/18. Maintaining NSR.     - Continue amio 200 mg daily.   6.  Wound R Foot: Partial thickness skin loss noted. Cover with mepilex border and change every 5 days.  7. Right arm pain: Likely neuropathic from stretch of brachial plexus with Impella placement.  8. ID:  WBCs normal. Afebrile. Blood Cx- NGTD.  9. DJ:TTSVXBL of very poor control but hgbA1ctrending down. Most recent 7.7%.  Insulin drip stopped 3/24. Blood glucose controlled on reduced Lantus and  sliding scale insulin to moderate.   - Diabetes coordinators have seen 10. Anemia: Has been stable. hgb 8.7 11. Deconditioning - PT/OT following, continue aggressive PT/OT efforts - CIR consulted 12. Unstageable Pressure Ulcer on  buttokcs.  - On hydrotherapy Mon-Sat.  - Continue reposition every 2 hours side lying position. Reposition frequently in the chair.   Continue therapy. For iHD today.   Amy Clegg NP-C  11/08/2020 7:15 AM  Patient seen with NP, agree with the above note.   Still having difficulty with vertigo (nystagmus noted) and nausea.   MAP in 80s generally it appears.  Got tunneled HD catheter yesterday.  LDH stable 198 with INR 3.   General: Well appearing this am. NAD.  HEENT: Normal. Neck: Supple, JVP 9-10 cm. Carotids OK.  Cardiac:  Mechanical heart sounds with LVAD hum present.  Lungs:  CTAB, normal effort.  Abdomen:  NT, ND, no HSM. No bruits or masses. +BS  LVAD exit site: Well-healed and incorporated. Dressing dry and intact. No erythema or drainage. Stabilization device present and accurately applied. Driveline dressing changed daily per sterile technique. Extremities:  Warm and dry. No cyanosis, clubbing, rash, or edema.  Neuro:  Alert & oriented x 3. Cranial nerves grossly intact. Moves all 4 extremities w/o difficulty. Affect pleasant    Cannot take meclizine due to history of anaphylaxis, will give scopolamine patch.    Working with PT this morning, eventually will need CIR.   To HD later today.   Loralie Champagne 11/08/2020 8:20 AM

## 2020-11-08 NOTE — Progress Notes (Signed)
Inpatient Rehab Admissions Coordinator:   Continuing to follow for timing of potential rehab admission pending medical stability and insurance authorization.   Shann Medal, PT, DPT Admissions Coordinator [phone] 11/08/20  10:44 AM

## 2020-11-08 NOTE — Progress Notes (Signed)
LVAD Coordinator Rounding Note:  Admitted 09/28/20 due to worsening heart failure symptoms. Recent COVID infection January 2022; did not take medications for 3-4 weeks.   S/p dental extractions 10/07/20 per Dr Benson Norway.  HM III LVAD implanted on 10/13/20 by Dr Cyndia Bent under Destination Therapy criteria.  Pt lying in bed resting upon my arrival. Unable to stand this morning due to dizziness. Complaining of nausea.  Reports continued weakness/numbness of his right hand. He has ball and putty at bedside, and a list of exercises that he is to complete throughout the day to increase dexterity in his hand. Will need extensive therapy in order to independently perform power source changes/handle VAD equipment. Encouraged patient and bedside RN to complete exercises several times a day to increase hand strength.   Intermittent HD started; tolerated well. Plan for HD today. Pt received tunneled catheter IR yesterday.  Vital signs: Temp: 97.7 HR: 85 Doppler: 102 Auto cuff: 99/75 (85) O2 Sat: 93% on RA Wt: 200.1>221.7>219.5.Marland KitchenMarland Kitchen>185.8>181>179.9>190.2>185.4>187.6 lbs    LVAD interrogation reveals:  Speed: 5200 Flow: 4.7 Power: 3.7w PI: 2.9 Hematocrit: 28  Alarms: none Events: 3 today  Fixed speed: 5200 Low speed limit: 4900  Drive Line: CDI. Drive line anchor secure. Continue Monday, Wednesday, Friday dressing changes by VAD coordinator or nurse champion. Next dressing change due 11/09/20.   Labs:  LDH trend: 444>670>575>571.....>246>244>245>220>209>198  INR trend: 1.4>1.7>1.8>1.7>1.6>1.8............1.8>1.9>1.7>2.4>2.8>2.6>3.0  Creatinine: 1.84>3.0>3.4>2.73.....>2.6>2.2>1.78>2.68>3.11>6.37>4.32>5.8>6.75  Anticoagulation Plan: -INR Goal: 2.0-2.5 -ASA Dose: 81 mg  Blood Products:  IntraOp:   1 Platelet  6 FFP  3 PRBC  1035 cell saver Postop:   10/13/20:   4 FFP  4 PRBC    10/14/20:  1 FFP  1 PRBC   10/16/20:  1 PRBC   10/18/20:  1 PRBC   10/16/20:  1 PRBC   10/18/20:  1  PRBC   10/27/20:  1 PRBC  Device: notified Ronalee Belts from [employer] to turn pts ICD back on 11/08/20 -  [employer] single - Therapies: off   Arrythmias: VT in OR requiring defibrillation   Renal: CRRT   - started 10/19/20   - stopped 10/26/20   - re-started 10/27/20  - stopped 10/30/20   iHD started 10/31/20   Infection: - 10/27/20 blood cultures - no growth x 5 days; final   Patient Education: 1. Pt not feeling well today. Reports he is dizzy, tired, and in pain. Education not appropriate at this time 2. Will plan time to continue VAD education next week when [name] and pt's son [name] are available to come to hospital.  3. Patient discharge education binder brought to bedside. Encouraged patient to begin reviewing over the weekend.  Plan/Recommendations:  1. Call VAD coordinator for equipment or drive line issues 2. MWF drive line dressing change per VAD coordinator or nurse champion. Next dressing change due 11/09/20.   Tanda Rockers RN [facility] Coordinator  Office: [phone]  24/7 Pager: [phone]

## 2020-11-08 NOTE — Progress Notes (Signed)
CSW attempted a visit at bedside although patient is in dialysis. CSW will follow up tomorrow. Raquel Sarna, [place], [place]

## 2020-11-08 NOTE — Progress Notes (Signed)
ANTICOAGULATION CONSULT NOTE - Follow Up Consult  Pharmacy Consult for Warfarin Indication: LVAD  Allergies  Allergen Reactions  . Meclizine Hcl Anaphylaxis and Swelling  . Ivabradine Nausea Only    Patient Measurements: Height: 5' 10.5" (179.1 cm) Weight: 85.1 kg (187 lb 9.8 oz) IBW/kg (Calculated) : 74.15 Heparin Dosing Weight: 89.2 kg  Vital Signs: Temp: 98 F (36.7 C) (04/05 0700) Temp Source: Oral (04/05 0700) BP: 90/72 (04/05 0918) Pulse Rate: 85 (04/05 0918)  Labs: Recent Labs    11/06/20 0500 11/07/20 0444 11/07/20 0923 11/08/20 0420  HGB 8.7* 8.8*  --  8.7*  HCT 28.3* 28.7*  --  28.1*  PLT 300 272  --  266  LABPROT 30.5* 26.9*  --  30.3*  INR 3.0* 2.6*  --  3.0*  CREATININE 4.37*  --  5.85* 6.75*    Estimated Creatinine Clearance: 12.4 mL/min (A) (by C-G formula based on SCr of 6.75 mg/dL (H)).   Medical History: Past Medical History:  Diagnosis Date  . AICD (automatic cardioverter/defibrillator) present    Single-chamber  implantable cardiac defibrillator - [employer]  . Atrial fibrillation ([place])   . Cataract    Mixed form OU  . CHF (congestive heart failure) ([place])   . Chronic kidney disease   . Chronic kidney disease (CKD), stage III (moderate) (H[facility])   . Chronic systolic heart failure ([place])    a. Echo 5/13: Mild LVE, mild LVH, EF 10%, anteroseptal, lateral, apical AK, mild MR, mild LAE, moderately reduced RVSF, mild RAE, PASP 60;  b. 07/2014 Echo: EF 20-2%, diff HK, AKI of antsep/apical/mid-apicalinferior, mod reduced RV.  Marland Kitchen Coronary artery disease    a. s/p CABG 2002 b. LHC 5/13:  dLM 80%, LAD subtotally occluded, pCFX occluded, pRCA 50%, mid? Occlusion with high take off of the PDA with 70% multiple lesions-not bypassed and supplies collaterals to LAD, LIMA-IM/ramus ok, S-OM ok, S-PLA branches ok. Medical therapy was recommended  . COVID   . Diabetic retinopathy (HCC)    NPDR OD, PDR OS  . Dyspnea   . Gout    "on daily RX" (01/08/2018)  .  Hypertension   . Hypertensive retinopathy    OU  . Ischemic cardiomyopathy    a. Echo 5/13: Mild LVE, mild LVH, EF 10%, anteroseptal, lateral, apical AK, mild MR, mild LAE, moderately reduced RVSF, mild RAE, PASP 60;  b. 01/2012 s/p MDT [id number] Protecta XT VR AICD;  c. 07/2014 Echo: EF 20-2%, diff HK, AKI of antsep/apical/mid-apicalinferior, mod reduced RV.  Marland Kitchen MRSA (methicillin resistant Staphylococcus aureus)    Status post right foot plantar deep infection with MRSA status post  I&D 10/2008  . Myocardial infarction [facility])    "was told I'd had several before heart OR in 2002" (01/08/2018)  . Noncompliance   . Peripheral neuropathy   . Retinopathy, diabetic, background ([place])   . Syncope   . Type II diabetes mellitus ([place])    requiring insulin   . Vitreous hemorrhage, left (HCC)    and proliferative diabetic retinopathy    Assessment: 60 yo M s/p new LVAD implant with HeartMate III on 3/10. Warfarin started per MD on 3/12, and pharmacy asked to take over warfarin dosing.   INR 3>goal today - very sensitive to even low doses. He is drinking 1 Ensure daily and eating well - per patient report. CBC stable with Hgb 8s, pltc 300. LDH stable 200s  Goal of Therapy:  INR 2-2.5 Monitor platelets by anticoagulation protocol: Yes   Plan:  Warfarin 0.5mg  x1 today  Daily PT/INR, CBC, monitor s/sx bleeding  Bonnita Nasuti Pharm.D. CPP, BCPS Clinical Pharmacist [phone] 11/08/2020 9:57 AM    Please check [email or web address] for unit-specific pharmacy phone numbers.

## 2020-11-08 NOTE — Progress Notes (Signed)
[facility] Progress Note    Assessment/ Plan:   Acute kidney injury on chronic kidney disease stage III: With underlying chronic kidney disease likely from diabetes/hypertension and worsening renal function from hemodynamically mediated ATN and cardiorenal syndrome. CRRT 3/15-3/22, 3/24-27; HD since 3/28. -Unfortunately, no signs of renal recovery at the moment - s/p [facility] 11/07/20 with IR, appreciate assistance -Plan next HD today Tues 11/08/20.  Tolerating ok so far.  - Outpt CLIP underway.    Hyponatremia, hypervolemic: now resolved. Cont volume maintenance with HD.   Acute exacerbation of chronic systolic heart failure with cardiogenic shock: LVAD, placed 3/10. Volume status/UF managed with RRT.   Per above UF to euvolemia.  On both midodrine and revatio, warfarin  Anemia:Likely secondary to chronic disease and exacerbated by recent surgical losses.  Transfuse PRBC as needed.  Iron deficient, has been repleted IV.  ESA seems to have fallen off MAR- reinstating for 100 mcg q Tuesday  Atrial fibrillation: per cardiology, on amiodarone  Protein calorie malnutrition, hypoalbuminemia: push protein, per primary  Subjective:    S/p TDC yesterday.  Feeling well.  For HD today.  Has some vertigo and nausea, getting scopolamine patch.     Objective:   BP 90/72 (BP Location: Left Arm)   Pulse 85   Temp 98 F (36.7 C) (Oral)   Resp 20   Ht 5' 10.5" (1.791 m)   Wt 85.1 kg   SpO2 93%   BMI 26.54 kg/m   Intake/Output Summary (Last 24 hours) at 11/08/2020 1115 Last data filed at 11/08/2020 0815 Gross per 24 hour  Intake 468.83 ml  Output --  Net 468.83 ml   Weight change:   Physical Exam: Gen: NAD, sitting in chair CVS: mechanical hum throughout  Resp: clear  Abd: soft, + drive line Ext: no LE edema, R foot with wound which is dressed ACCESS: R TDC c/d/i  Imaging: IR Fluoro Guide CV Line Right  Result Date: 11/07/2020 INDICATION: End-stage renal disease. In  need of durable intravenous access for continuation of hemodialysis. EXAM: 1. BEDSIDE REMOVAL OF PRE-EXISTING NON TUNNELED TEMPORARY RIGHT INTERNAL JUGULAR APPROACH DIALYSIS CATHETER. 2. TUNNELED CENTRAL VENOUS HEMODIALYSIS CATHETER PLACEMENT WITH ULTRASOUND AND FLUOROSCOPIC GUIDANCE MEDICATIONS: Ancef 2 gm IV . The antibiotic was given in an appropriate time interval prior to skin puncture. ANESTHESIA/SEDATION: Moderate (conscious) sedation was employed during this procedure. A total of Versed 0.5 mg and Fentanyl 25 mcg was administered intravenously. Moderate Sedation Time: 14 minutes. The patient's level of consciousness and vital signs were monitored continuously by radiology nursing throughout the procedure under my direct supervision. FLUOROSCOPY TIME:  24 seconds (5 mGy) COMPLICATIONS: None immediate. PROCEDURE: Informed written consent was obtained from the patient after a discussion of the risks, benefits, and alternatives to treatment. Questions regarding the procedure were encouraged and answered. Sonographic evaluation was performed of the right internal jugular vein demonstrating wide patency despite the presence of a pre-existing non tunneled hemodialysis catheter. As the access site of the non tunneled hemodialysis catheter was deemed to be slightly superiorly positioned, the temporary catheter was removed and hemostasis was achieved with manual compression. Next, the right neck and chest were prepped with chlorhexidine in a sterile fashion, and a sterile drape was applied covering the operative field. Maximum barrier sterile technique with sterile gowns and gloves were used for the procedure. A timeout was performed prior to the initiation of the procedure. After creating a small venotomy incision, a micropuncture kit was utilized to access the internal  jugular vein. Real-time ultrasound guidance was utilized for vascular access including the acquisition of a permanent ultrasound image documenting  patency of the accessed vessel. The microwire was utilized to measure appropriate catheter length. A stiff Glidewire was advanced to the level of the IVC and the micropuncture sheath was exchanged for a peel-away sheath. A palindrome tunneled hemodialysis catheter measuring 23 cm from tip to cuff was tunneled in a retrograde fashion from the anterior chest wall to the venotomy incision (note, a 19 cm tip to cuff catheter was not available at the time of placement as they are currently on back order). The catheter was then placed through the peel-away sheath with tips ultimately positioned within the superior aspect of the right atrium. Final catheter positioning was confirmed and documented with a spot radiographic image. The catheter aspirates and flushes normally. The catheter was flushed with appropriate volume heparin dwells. The catheter exit site was secured with a 0-Prolene retention suture. The venotomy incision was closed with Dermabond and Steri-strips. Dressings were applied. The patient tolerated the procedure well without immediate post procedural complication. IMPRESSION: Successful placement of 23 cm tip to cuff tunneled hemodialysis catheter via the right internal jugular vein with tips terminating within the superior aspect of the right atrium. The catheter is ready for immediate use. Electronically Signed   By: Simonne Come M.D.   On: 11/07/2020 16:41   IR US Guide Vasc Access Right  Result Date: 11/07/2020 INDICATION: End-stage renal disease. In need of durable intravenous access for continuation of hemodialysis. EXAM: 1. BEDSIDE REMOVAL OF PRE-EXISTING NON TUNNELED TEMPORARY RIGHT INTERNAL JUGULAR APPROACH DIALYSIS CATHETER. 2. TUNNELED CENTRAL VENOUS HEMODIALYSIS CATHETER PLACEMENT WITH ULTRASOUND AND FLUOROSCOPIC GUIDANCE MEDICATIONS: Ancef 2 gm IV . The antibiotic was given in an appropriate time interval prior to skin puncture. ANESTHESIA/SEDATION: Moderate (conscious) sedation was employed  during this procedure. A total of Versed 0.5 mg and Fentanyl 25 mcg was administered intravenously. Moderate Sedation Time: 14 minutes. The patient's level of consciousness and vital signs were monitored continuously by radiology nursing throughout the procedure under my direct supervision. FLUOROSCOPY TIME:  24 seconds (5 mGy) COMPLICATIONS: None immediate. PROCEDURE: Informed written consent was obtained from the patient after a discussion of the risks, benefits, and alternatives to treatment. Questions regarding the procedure were encouraged and answered. Sonographic evaluation was performed of the right internal jugular vein demonstrating wide patency despite the presence of a pre-existing non tunneled hemodialysis catheter. As the access site of the non tunneled hemodialysis catheter was deemed to be slightly superiorly positioned, the temporary catheter was removed and hemostasis was achieved with manual compression. Next, the right neck and chest were prepped with chlorhexidine in a sterile fashion, and a sterile drape was applied covering the operative field. Maximum barrier sterile technique with sterile gowns and gloves were used for the procedure. A timeout was performed prior to the initiation of the procedure. After creating a small venotomy incision, a micropuncture kit was utilized to access the internal jugular vein. Real-time ultrasound guidance was utilized for vascular access including the acquisition of a permanent ultrasound image documenting patency of the accessed vessel. The microwire was utilized to measure appropriate catheter length. A stiff Glidewire was advanced to the level of the IVC and the micropuncture sheath was exchanged for a peel-away sheath. A palindrome tunneled hemodialysis catheter measuring 23 cm from tip to cuff was tunneled in a retrograde fashion from the anterior chest wall to the venotomy incision (note, a 19 cm tip to cuff  catheter was not available at the time of  placement as they are currently on back order). The catheter was then placed through the peel-away sheath with tips ultimately positioned within the superior aspect of the right atrium. Final catheter positioning was confirmed and documented with a spot radiographic image. The catheter aspirates and flushes normally. The catheter was flushed with appropriate volume heparin dwells. The catheter exit site was secured with a 0-Prolene retention suture. The venotomy incision was closed with Dermabond and Steri-strips. Dressings were applied. The patient tolerated the procedure well without immediate post procedural complication. IMPRESSION: Successful placement of 23 cm tip to cuff tunneled hemodialysis catheter via the right internal jugular vein with tips terminating within the superior aspect of the right atrium. The catheter is ready for immediate use. Electronically Signed   By: Sandi Mariscal M.D.   On: 11/07/2020 16:41    Labs: BMET Recent Labs  Lab 11/02/20 0517 11/03/20 0540 11/04/20 0334 11/05/20 0500 11/06/20 0500 11/07/20 0923 11/08/20 0420  NA 132* 132* 135 133* 136 136 136  K 4.7 5.2* 4.3 4.9 4.6 4.8 5.5*  CL 95* 95* 98 95* 97* 96* 97*  CO2 $Re'26 25 27 25 28 28 26  'Hmp$ GLUCOSE 207* 154* 125* 70 107* 79 110*  BUN 63* 87* 50* 69* 41* 57* 70*  CREATININE 4.93* 6.37* 4.32* 5.75* 4.37* 5.85* 6.75*  CALCIUM 9.0 9.2 8.8* 9.2 9.1 9.3 9.1  PHOS 3.0 4.8* 3.9 6.6* 5.2* 6.9* 7.5*   CBC Recent Labs  Lab 11/01/20 2003 11/06/20 0500 11/07/20 0444 11/08/20 0420  WBC 12.2* 8.3 9.1 8.2  HGB 8.3* 8.7* 8.8* 8.7*  HCT 27.3* 28.3* 28.7* 28.1*  MCV 90.4 89.8 88.0 90.1  PLT 301 300 272 266    Medications:    . (feeding supplement) PROSource Plus  30 mL Oral BID BM  . amiodarone  200 mg Oral Daily  . aspirin EC  81 mg Oral Daily  . Chlorhexidine Gluconate Cloth  6 each Topical Daily  . Chlorhexidine Gluconate Cloth  6 each Topical Q0600  . collagenase   Topical Daily  . feeding supplement  237  mL Oral TID BM  . gabapentin  100 mg Oral QHS  . insulin aspart  0-15 Units Subcutaneous TID WC  . insulin aspart  0-5 Units Subcutaneous QHS  . insulin glargine  15 Units Subcutaneous Daily  . midodrine  15 mg Oral TID WC  . multivitamin  1 tablet Oral QHS  . pantoprazole  40 mg Oral Daily  . polyethylene glycol  17 g Oral BID  . rosuvastatin  20 mg Oral Daily  . scopolamine  1 patch Transdermal Q72H  . senna-docusate  2 tablet Oral QHS  . sodium chloride flush  10-40 mL Intracatheter Q12H  . sorbitol  30 mL Oral Once  . warfarin  0.5 mg Oral ONCE-1600  . Warfarin - Pharmacist Dosing Inpatient   Does not apply q1600      Madelon Lips, MD 11/08/2020, 11:15 AM

## 2020-11-08 NOTE — Progress Notes (Signed)
Attempted to stand patient for morning weight and unable to stand with assist of two people. Patient became very weak and dizzy and vomited. There was no significant change on monitor at the time. Patient would benefit if we could find a way to better control the dizziness.  Bed weight inaccurate at this time.

## 2020-11-09 ENCOUNTER — Inpatient Hospital Stay (HOSPITAL_COMMUNITY): Payer: Medicare HMO

## 2020-11-09 DIAGNOSIS — I5023 Acute on chronic systolic (congestive) heart failure: Secondary | ICD-10-CM | POA: Diagnosis not present

## 2020-11-09 DIAGNOSIS — Z95811 Presence of heart assist device: Secondary | ICD-10-CM | POA: Diagnosis not present

## 2020-11-09 LAB — RENAL FUNCTION PANEL
Albumin: 2.8 g/dL — ABNORMAL LOW (ref 3.5–5.0)
Anion gap: 11 (ref 5–15)
BUN: 40 mg/dL — ABNORMAL HIGH (ref 6–20)
CO2: 27 mmol/L (ref 22–32)
Calcium: 9.2 mg/dL (ref 8.9–10.3)
Chloride: 96 mmol/L — ABNORMAL LOW (ref 98–111)
Creatinine, Ser: 4.85 mg/dL — ABNORMAL HIGH (ref 0.61–1.24)
GFR, Estimated: 13 mL/min — ABNORMAL LOW (ref >60)
Glucose, Bld: 73 mg/dL (ref 70–99)
Phosphorus: 5.4 mg/dL — ABNORMAL HIGH (ref 2.5–4.6)
Potassium: 4.9 mmol/L (ref 3.5–5.1)
Sodium: 134 mmol/L — ABNORMAL LOW (ref 135–145)

## 2020-11-09 LAB — CBC
HCT: 28.8 % — ABNORMAL LOW (ref 39.0–52.0)
Hemoglobin: 8.8 g/dL — ABNORMAL LOW (ref 13.0–17.0)
MCH: 27.3 pg (ref 26.0–34.0)
MCHC: 30.6 g/dL (ref 30.0–36.0)
MCV: 89.4 fL (ref 80.0–100.0)
Platelets: 246 10*3/uL (ref 150–400)
RBC: 3.22 MIL/uL — ABNORMAL LOW (ref 4.22–5.81)
RDW: 17.8 % — ABNORMAL HIGH (ref 11.5–15.5)
WBC: 8.3 10*3/uL (ref 4.0–10.5)
nRBC: 0 % (ref 0.0–0.2)

## 2020-11-09 LAB — GLUCOSE, CAPILLARY
Glucose-Capillary: 99 mg/dL (ref 70–99)
Glucose-Capillary: 74 mg/dL (ref 70–99)
Glucose-Capillary: 106 mg/dL — ABNORMAL HIGH (ref 70–99)
Glucose-Capillary: 127 mg/dL — ABNORMAL HIGH (ref 70–99)
Glucose-Capillary: 123 mg/dL — ABNORMAL HIGH (ref 70–99)

## 2020-11-09 LAB — PROCALCITONIN: Procalcitonin: 0.67 ng/mL

## 2020-11-09 LAB — PROTIME-INR
INR: 3.1 — ABNORMAL HIGH (ref 0.8–1.2)
Prothrombin Time: 30.7 seconds — ABNORMAL HIGH (ref 11.4–15.2)

## 2020-11-09 LAB — COOXEMETRY PANEL
Carboxyhemoglobin: 2.1 % — ABNORMAL HIGH (ref 0.5–1.5)
Methemoglobin: 1 % (ref 0.0–1.5)
O2 Saturation: 50.2 %
Total hemoglobin: 9 g/dL — ABNORMAL LOW (ref 12.0–16.0)

## 2020-11-09 LAB — LACTATE DEHYDROGENASE: LDH: 226 U/L — ABNORMAL HIGH (ref 98–192)

## 2020-11-09 LAB — MAGNESIUM: Magnesium: 2.3 mg/dL (ref 1.7–2.4)

## 2020-11-09 MED ORDER — KIDNEY FAILURE BOOK: Freq: Once | Status: AC

## 2020-11-09 MED ORDER — WARFARIN 0.5 MG HALF TABLET
0.5000 mg | ORAL_TABLET | Freq: Once | ORAL | Status: AC
  Administered 2020-11-09: 0.5 mg via ORAL
  Filled 2020-11-09: qty 1

## 2020-11-09 MED ORDER — METOCLOPRAMIDE HCL 10 MG PO TABS
5.0000 mg | ORAL_TABLET | Freq: Three times a day (TID) | ORAL | Status: DC
  Administered 2020-11-09 – 2020-11-14 (×16): 5 mg via ORAL
  Filled 2020-11-09 (×16): qty 1

## 2020-11-09 NOTE — Plan of Care (Signed)
Problem: Education: Goal: Knowledge of General Education information will improve Description: Including pain rating scale, medication(s)/side effects and non-pharmacologic comfort measures Outcome: Progressing   Problem: Health Behavior/Discharge Planning: Goal: Ability to manage health-related needs will improve Outcome: Progressing   Problem: Clinical Measurements: Goal: Ability to maintain clinical measurements within normal limits will improve Outcome: Progressing Goal: Will remain free from infection Outcome: Progressing Goal: Diagnostic test results will improve Outcome: Progressing Goal: Cardiovascular complication will be avoided Outcome: Progressing   Problem: Activity: Goal: Risk for activity intolerance will decrease Outcome: Progressing   Problem: Nutrition: Goal: Adequate nutrition will be maintained Outcome: Progressing   Problem: Coping: Goal: Level of anxiety will decrease Outcome: Progressing   Problem: Elimination: Goal: Will not experience complications related to bowel motility Outcome: Progressing   Problem: Pain Managment: Goal: General experience of comfort will improve Outcome: Progressing   Problem: Safety: Goal: Ability to remain free from injury will improve Outcome: Progressing   Problem: Skin Integrity: Goal: Risk for impaired skin integrity will decrease Outcome: Progressing   Problem: Education: Goal: Ability to demonstrate management of disease process will improve Outcome: Progressing Goal: Ability to verbalize understanding of medication therapies will improve Outcome: Progressing Goal: Individualized Educational Video(s) Outcome: Progressing   Problem: Activity: Goal: Capacity to carry out activities will improve Outcome: Progressing   Problem: Cardiac: Goal: Ability to achieve and maintain adequate cardiopulmonary perfusion will improve Outcome: Progressing   Problem: Education: Goal: Knowledge of disease or  condition will improve Outcome: Progressing Goal: Understanding of medication regimen will improve Outcome: Progressing Goal: Individualized Educational Video(s) Outcome: Progressing   Problem: Activity: Goal: Ability to tolerate increased activity will improve Outcome: Progressing   Problem: Cardiac: Goal: Ability to achieve and maintain adequate cardiopulmonary perfusion will improve Outcome: Progressing   Problem: Health Behavior/Discharge Planning: Goal: Ability to safely manage health-related needs after discharge will improve Outcome: Progressing   Problem: Education: Goal: Understanding of CV disease, CV risk reduction, and recovery process will improve Outcome: Progressing Goal: Individualized Educational Video(s) Outcome: Progressing   Problem: Activity: Goal: Ability to return to baseline activity level will improve Outcome: Progressing   Problem: Cardiovascular: Goal: Ability to achieve and maintain adequate cardiovascular perfusion will improve Outcome: Progressing Goal: Vascular access site(s) Level 0-1 will be maintained Outcome: Progressing   Problem: Health Behavior/Discharge Planning: Goal: Ability to safely manage health-related needs after discharge will improve Outcome: Progressing   Problem: Education: Goal: Knowledge of disease and its progression will improve Outcome: Progressing   Problem: Health Behavior/Discharge Planning: Goal: Ability to manage health-related needs will improve Outcome: Progressing   Problem: Clinical Measurements: Goal: Complications related to the disease process or treatment will be avoided or minimized Outcome: Progressing Goal: Dialysis access will remain free of complications Outcome: Progressing   Problem: Activity: Goal: Activity intolerance will improve Outcome: Progressing   Problem: Fluid Volume: Goal: Fluid volume balance will be maintained or improved Outcome: Progressing   Problem:  Nutritional: Goal: Ability to make appropriate dietary choices will improve Outcome: Progressing   Problem: Respiratory: Goal: Respiratory symptoms related to disease process will be avoided Outcome: Progressing   Problem: Self-Concept: Goal: Body image disturbance will be avoided or minimized Outcome: Progressing   Problem: Urinary Elimination: Goal: Progression of disease will be identified and treated Outcome: Progressing   Problem: Education: Goal: Knowledge of disease and its progression will improve Outcome: Progressing   Problem: Health Behavior/Discharge Planning: Goal: Ability to manage health-related needs will improve Outcome: Progressing   Problem: Clinical Measurements: Goal: Complications  related to the disease process or treatment will be avoided or minimized Outcome: Progressing Goal: Dialysis access will remain free of complications Outcome: Progressing   Problem: Activity: Goal: Activity intolerance will improve Outcome: Progressing   Problem: Fluid Volume: Goal: Fluid volume balance will be maintained or improved Outcome: Progressing   Problem: Nutritional: Goal: Ability to make appropriate dietary choices will improve Outcome: Progressing   Problem: Respiratory: Goal: Respiratory symptoms related to disease process will be avoided Outcome: Progressing   Problem: Self-Concept: Goal: Body image disturbance will be avoided or minimized Outcome: Progressing

## 2020-11-09 NOTE — Progress Notes (Signed)
ANTICOAGULATION CONSULT NOTE - Follow Up Consult  Pharmacy Consult for Warfarin Indication: LVAD  Allergies  Allergen Reactions  . Meclizine Hcl Anaphylaxis and Swelling  . Ivabradine Nausea Only    Patient Measurements: Height: 5' 10.5" (179.1 cm) Weight:  (scale broken) IBW/kg (Calculated) : 74.15 Heparin Dosing Weight: 89.2 kg  Vital Signs: Temp: 98.5 F (36.9 C) (04/06 0800) Temp Source: Tympanic (04/06 0800) BP: 98/84 (04/06 0800) Pulse Rate: 81 (04/06 0800)  Labs: Recent Labs    11/07/20 0444 11/07/20 0923 11/08/20 0420 11/09/20 0329 11/09/20 0330  HGB 8.8*  --  8.7* 8.8*  --   HCT 28.7*  --  28.1* 28.8*  --   PLT 272  --  266 246  --   LABPROT 26.9*  --  30.3* 30.7*  --   INR 2.6*  --  3.0* 3.1*  --   CREATININE  --  5.85* 6.75*  --  4.85*    Estimated Creatinine Clearance: 17.2 mL/min (A) (by C-G formula based on SCr of 4.85 mg/dL (H)).   Medical History: Past Medical History:  Diagnosis Date  . AICD (automatic cardioverter/defibrillator) present    Single-chamber  implantable cardiac defibrillator - [employer]  . Atrial fibrillation ([place])   . Cataract    Mixed form OU  . CHF (congestive heart failure) ([place])   . Chronic kidney disease   . Chronic kidney disease (CKD), stage III (moderate) (HCC)   . Chronic systolic heart failure ([place])    a. Echo 5/13: Mild LVE, mild LVH, EF 10%, anteroseptal, lateral, apical AK, mild MR, mild LAE, moderately reduced RVSF, mild RAE, PASP 60;  b. 07/2014 Echo: EF 20-2%, diff HK, AKI of antsep/apical/mid-apicalinferior, mod reduced RV.  Marland Kitchen Coronary artery disease    a. s/p CABG 2002 b. LHC 5/13:  dLM 80%, LAD subtotally occluded, pCFX occluded, pRCA 50%, mid? Occlusion with high take off of the PDA with 70% multiple lesions-not bypassed and supplies collaterals to LAD, LIMA-IM/ramus ok, S-OM ok, S-PLA branches ok. Medical therapy was recommended  . COVID   . Diabetic retinopathy (HCC)    NPDR OD, PDR OS  . Dyspnea   .  Gout    "on daily RX" (01/08/2018)  . Hypertension   . Hypertensive retinopathy    OU  . Ischemic cardiomyopathy    a. Echo 5/13: Mild LVE, mild LVH, EF 10%, anteroseptal, lateral, apical AK, mild MR, mild LAE, moderately reduced RVSF, mild RAE, PASP 60;  b. 01/2012 s/p MDT [id number] Protecta XT VR AICD;  c. 07/2014 Echo: EF 20-2%, diff HK, AKI of antsep/apical/mid-apicalinferior, mod reduced RV.  Marland Kitchen MRSA (methicillin resistant Staphylococcus aureus)    Status post right foot plantar deep infection with MRSA status post  I&D 10/2008  . Myocardial infarction [facility])    "was told I'd had several before heart OR in 2002" (01/08/2018)  . Noncompliance   . Peripheral neuropathy   . Retinopathy, diabetic, background ([place])   . Syncope   . Type II diabetes mellitus ([place])    requiring insulin   . Vitreous hemorrhage, left (HCC)    and proliferative diabetic retinopathy    Assessment: 60 yo M s/p new LVAD implant with HeartMate III on 3/10. Warfarin started per MD on 3/12, and pharmacy asked to take over warfarin dosing.   INR 3.1. above goal today - very sensitive to even low doses. He is drinking 1 Ensure daily and eating well - per patient report. CBC stable with Hgb 8s, pltc 300.  LDH stable 200s  Goal of Therapy:  INR 2-2.5 Monitor platelets by anticoagulation protocol: Yes   Plan:  Warfarin 0.5 mg x1 again today  Daily PT/INR, CBC, monitor s/sx bleeding  [place] Crane, Roylene Reason, [facility] Clinical Pharmacist  11/09/2020 9:40 AM   [facility] pharmacy phone numbers are listed on [email or web address]

## 2020-11-09 NOTE — Progress Notes (Signed)
Rounded on patient today in correlation to transition to outpatient HD. Patient found sitting up in recliner in his room. Patient is pleasant and agreeable to conversation. Ordered Kidney Failure book. Patient educated at the bedside regarding care of tunneled dialysis catheter and proper medication administration on HD days.  Patient also educated on the importance of adhering to scheduled dialysis treatments, the effects of fluid overload, hyperkalemia and hyperphosphatemia. Patient notably on a regular diet at this time. Most likely due to his lack of nutrition. Encouraged patient to attempt to complete 25 % of his meal. 1 ensure noted as finished.   Also educated patient on services available through the interdisciplinary team in the clinic setting and the differences they will note when transitioning to the outpatient setting from the hospital. Patient verbalizes his concern for stress on his son and brother since his admit. Patient was notably tearful. This RN allowed for time for patient to voice his concern. will re-round on patient tomorrow. Handouts and contact information provided to patient for any further assistance.   Dorthey Sawyer, RN  Dialysis Nurse Coordinator Phone: [phone]

## 2020-11-09 NOTE — Progress Notes (Signed)
[facility] Progress Note    Assessment/ Plan:   Acute kidney injury on chronic kidney disease stage III: With underlying chronic kidney disease likely from diabetes/hypertension and worsening renal function from hemodynamically mediated ATN and cardiorenal syndrome. CRRT 3/15-3/22, 3/24-27; HD since 3/28. -Unfortunately, no signs of renal recovery at the moment - s/p [facility] 11/07/20 with IR, appreciate assistance - HD TTS schedule now - Outpt CLIP underway.    Acute exacerbation of chronic systolic heart failure with cardiogenic shock: LVAD, placed 3/10. Volume status/UF managed with RRT.   Per above UF to euvolemia.  midodrine and warfarin  Anemia:Likely secondary to chronic disease and exacerbated by recent surgical losses.  Transfuse PRBC as needed.  Iron deficient, has been repleted IV.  ESA seems to have fallen off MAR- reinstating for 100 mcg q Tuesday  Atrial fibrillation: per cardiology, on amiodarone  Protein calorie malnutrition, hypoalbuminemia: push protein, per primary  Pressure ulcer: getting hydrotherapy  Subjective:    HD yesterday, did well.     Objective:   BP 98/84 (BP Location: Left Arm)   Pulse 81   Temp 98.5 F (36.9 C) (Tympanic)   Resp 16   Ht 5' 10.5" (1.791 m)   Wt 85.1 kg   SpO2 96%   BMI 26.54 kg/m   Intake/Output Summary (Last 24 hours) at 11/09/2020 1051 Last data filed at 11/09/2020 6237 Gross per 24 hour  Intake 270 ml  Output 1500 ml  Net -1230 ml   Weight change:   Physical Exam: Gen: NAD, sitting in chair CVS: mechanical hum throughout  Resp: clear  Abd: soft, + drive line Ext: no LE edema, R foot with wound which is dressed ACCESS: R TDC c/d/i  Imaging: IR Fluoro Guide CV Line Right  Result Date: 11/07/2020 INDICATION: End-stage renal disease. In need of durable intravenous access for continuation of hemodialysis. EXAM: 1. BEDSIDE REMOVAL OF PRE-EXISTING NON TUNNELED TEMPORARY RIGHT INTERNAL JUGULAR APPROACH  DIALYSIS CATHETER. 2. TUNNELED CENTRAL VENOUS HEMODIALYSIS CATHETER PLACEMENT WITH ULTRASOUND AND FLUOROSCOPIC GUIDANCE MEDICATIONS: Ancef 2 gm IV . The antibiotic was given in an appropriate time interval prior to skin puncture. ANESTHESIA/SEDATION: Moderate (conscious) sedation was employed during this procedure. A total of Versed 0.5 mg and Fentanyl 25 mcg was administered intravenously. Moderate Sedation Time: 14 minutes. The patient's level of consciousness and vital signs were monitored continuously by radiology nursing throughout the procedure under my direct supervision. FLUOROSCOPY TIME:  24 seconds (5 mGy) COMPLICATIONS: None immediate. PROCEDURE: Informed written consent was obtained from the patient after a discussion of the risks, benefits, and alternatives to treatment. Questions regarding the procedure were encouraged and answered. Sonographic evaluation was performed of the right internal jugular vein demonstrating wide patency despite the presence of a pre-existing non tunneled hemodialysis catheter. As the access site of the non tunneled hemodialysis catheter was deemed to be slightly superiorly positioned, the temporary catheter was removed and hemostasis was achieved with manual compression. Next, the right neck and chest were prepped with chlorhexidine in a sterile fashion, and a sterile drape was applied covering the operative field. Maximum barrier sterile technique with sterile gowns and gloves were used for the procedure. A timeout was performed prior to the initiation of the procedure. After creating a small venotomy incision, a micropuncture kit was utilized to access the internal jugular vein. Real-time ultrasound guidance was utilized for vascular access including the acquisition of a permanent ultrasound image documenting patency of the accessed vessel. The microwire was utilized to measure  appropriate catheter length. A stiff Glidewire was advanced to the level of the IVC and the  micropuncture sheath was exchanged for a peel-away sheath. A palindrome tunneled hemodialysis catheter measuring 23 cm from tip to cuff was tunneled in a retrograde fashion from the anterior chest wall to the venotomy incision (note, a 19 cm tip to cuff catheter was not available at the time of placement as they are currently on back order). The catheter was then placed through the peel-away sheath with tips ultimately positioned within the superior aspect of the right atrium. Final catheter positioning was confirmed and documented with a spot radiographic image. The catheter aspirates and flushes normally. The catheter was flushed with appropriate volume heparin dwells. The catheter exit site was secured with a 0-Prolene retention suture. The venotomy incision was closed with Dermabond and Steri-strips. Dressings were applied. The patient tolerated the procedure well without immediate post procedural complication. IMPRESSION: Successful placement of 23 cm tip to cuff tunneled hemodialysis catheter via the right internal jugular vein with tips terminating within the superior aspect of the right atrium. The catheter is ready for immediate use. Electronically Signed   By: Sandi Mariscal M.D.   On: 11/07/2020 16:41   IR US Guide Vasc Access Right  Result Date: 11/07/2020 INDICATION: End-stage renal disease. In need of durable intravenous access for continuation of hemodialysis. EXAM: 1. BEDSIDE REMOVAL OF PRE-EXISTING NON TUNNELED TEMPORARY RIGHT INTERNAL JUGULAR APPROACH DIALYSIS CATHETER. 2. TUNNELED CENTRAL VENOUS HEMODIALYSIS CATHETER PLACEMENT WITH ULTRASOUND AND FLUOROSCOPIC GUIDANCE MEDICATIONS: Ancef 2 gm IV . The antibiotic was given in an appropriate time interval prior to skin puncture. ANESTHESIA/SEDATION: Moderate (conscious) sedation was employed during this procedure. A total of Versed 0.5 mg and Fentanyl 25 mcg was administered intravenously. Moderate Sedation Time: 14 minutes. The patient's level of  consciousness and vital signs were monitored continuously by radiology nursing throughout the procedure under my direct supervision. FLUOROSCOPY TIME:  24 seconds (5 mGy) COMPLICATIONS: None immediate. PROCEDURE: Informed written consent was obtained from the patient after a discussion of the risks, benefits, and alternatives to treatment. Questions regarding the procedure were encouraged and answered. Sonographic evaluation was performed of the right internal jugular vein demonstrating wide patency despite the presence of a pre-existing non tunneled hemodialysis catheter. As the access site of the non tunneled hemodialysis catheter was deemed to be slightly superiorly positioned, the temporary catheter was removed and hemostasis was achieved with manual compression. Next, the right neck and chest were prepped with chlorhexidine in a sterile fashion, and a sterile drape was applied covering the operative field. Maximum barrier sterile technique with sterile gowns and gloves were used for the procedure. A timeout was performed prior to the initiation of the procedure. After creating a small venotomy incision, a micropuncture kit was utilized to access the internal jugular vein. Real-time ultrasound guidance was utilized for vascular access including the acquisition of a permanent ultrasound image documenting patency of the accessed vessel. The microwire was utilized to measure appropriate catheter length. A stiff Glidewire was advanced to the level of the IVC and the micropuncture sheath was exchanged for a peel-away sheath. A palindrome tunneled hemodialysis catheter measuring 23 cm from tip to cuff was tunneled in a retrograde fashion from the anterior chest wall to the venotomy incision (note, a 19 cm tip to cuff catheter was not available at the time of placement as they are currently on back order). The catheter was then placed through the peel-away sheath with tips ultimately positioned within  the superior  aspect of the right atrium. Final catheter positioning was confirmed and documented with a spot radiographic image. The catheter aspirates and flushes normally. The catheter was flushed with appropriate volume heparin dwells. The catheter exit site was secured with a 0-Prolene retention suture. The venotomy incision was closed with Dermabond and Steri-strips. Dressings were applied. The patient tolerated the procedure well without immediate post procedural complication. IMPRESSION: Successful placement of 23 cm tip to cuff tunneled hemodialysis catheter via the right internal jugular vein with tips terminating within the superior aspect of the right atrium. The catheter is ready for immediate use. Electronically Signed   By: Sandi Mariscal M.D.   On: 11/07/2020 16:41    Labs: BMET Recent Labs  Lab 11/03/20 0540 11/04/20 0334 11/05/20 0500 11/06/20 0500 11/07/20 0923 11/08/20 0420 11/09/20 0330  NA 132* 135 133* 136 136 136 134*  K 5.2* 4.3 4.9 4.6 4.8 5.5* 4.9  CL 95* 98 95* 97* 96* 97* 96*  CO2 _0 GLUCOSE 154* 125* 70 107* 79 110* 73  BUN 87* 50* 69* 41* 57* 70* 40*  CREATININE 6.37* 4.32* 5.75* 4.37* 5.85* 6.75* 4.85*  CALCIUM 9.2 8.8* 9.2 9.1 9.3 9.1 9.2  PHOS 4.8* 3.9 6.6* 5.2* 6.9* 7.5* 5.4*   CBC Recent Labs  Lab 11/06/20 0500 11/07/20 0444 11/08/20 0420 11/09/20 0329  WBC 8.3 9.1 8.2 8.3  HGB 8.7* 8.8* 8.7* 8.8*  HCT 28.3* 28.7* 28.1* 28.8*  MCV 89.8 88.0 90.1 89.4  PLT 300 272 266 246    Medications:    . (feeding supplement) PROSource Plus  30 mL Oral BID BM  . amiodarone  200 mg Oral Daily  . aspirin EC  81 mg Oral Daily  . Chlorhexidine Gluconate Cloth  6 each Topical Daily  . Chlorhexidine Gluconate Cloth  6 each Topical Q0600  . collagenase   Topical Daily  . [START ON 11/15/2020] darbepoetin (ARANESP) injection - DIALYSIS  100 mcg Intravenous Q Tue-HD  . feeding supplement  237 mL Oral TID BM  . gabapentin  100 mg Oral QHS  . insulin  aspart  0-15 Units Subcutaneous TID WC  . insulin aspart  0-5 Units Subcutaneous QHS  . insulin glargine  15 Units Subcutaneous Daily  . metoCLOPramide  5 mg Oral TID AC  . midodrine  15 mg Oral TID WC  . multivitamin  1 tablet Oral QHS  . pantoprazole  40 mg Oral Daily  . polyethylene glycol  17 g Oral BID  . rosuvastatin  20 mg Oral Daily  . scopolamine  1 patch Transdermal Q72H  . senna-docusate  2 tablet Oral QHS  . sodium chloride flush  10-40 mL Intracatheter Q12H  . warfarin  0.5 mg Oral ONCE-1600  . Warfarin - Pharmacist Dosing Inpatient   Does not apply q1600      Madelon Lips, MD 11/09/2020, 10:51 AM

## 2020-11-09 NOTE — Progress Notes (Signed)
Occupational Therapy Treatment Note  Seen as cotreat with PT this date. Ted hose applied before mobility. Requires Max A to switch power systems. Pt with complaints of dizziness with sitting, however subsided and willing to ambulate. Completed Counter-pressure exercises before ambulating.Upon standing, pt with increased posterior and R bias. +2 Min HHA with mobility - however had near syncopal event after ambulating @ 30 ft - buckling, leaning R and returned to seated position. Pt with short period of decreased responsiveness. Upon sitting in chair, significant complaints of pain due to sacral wound. Note increased jerky involuntary movements throughout session in addition to increased dry cough. Pt with complaints of blurred vision/double vision at times. Increased difficulty clearing RLE during attempted mobility. NP made aware. Encouraged pt to complete HEP for RUE. Will cotinue to follow acutely.    11/09/20 1135  OT Visit Information  Last OT Received On 11/09/20  Assistance Needed +2  PT/OT/SLP Co-Evaluation/Treatment Yes  Reason for Co-Treatment Complexity of the patient's impairments (multi-system involvement);For patient/therapist safety;To address functional/ADL transfers  OT goals addressed during session ADL's and self-care;Strengthening/ROM  History of Present Illness 60 y.o. male admitted 2/28 with ICM for LVAD workup. Impella placed 3/1. Heartmate 3 LVAD placed 3/10. CRRT started 3/15. CRRT held 3/23-3/24 then restarted until 3/27 with iHD started 3/28. PMHx: DM2, CAD s/p CABG 2595, systolic HF due to ischemic cardiomyopathy with EF 20-25% (echo 12/15), DM2 and CKD. He is s/p [employer] single chamber ICD. Covid 1/22.  Precautions  Precautions Fall;Sternal;Other (comment)  Precaution Comments LVAD, sacral wound  Required Braces or Orthoses Other Brace  Other Brace thumb loop and buddy strap R hand  Pain Assessment  Pain Assessment Faces  Faces Pain Scale 8  Pain Location sacrum   Pain Descriptors / Indicators Grimacing;Guarding  Pain Intervention(s) Limited activity within patient's tolerance;Repositioned;Patient requesting pain meds-RN notified  Cognition  Arousal/Alertness Awake/alert  Behavior During Therapy WFL for tasks assessed/performed  Overall Cognitive Status Impaired/Different from baseline  Area of Impairment Attention;Memory;Following commands;Safety/judgement;Awareness;Problem solving  Memory Decreased short-term memory  Following Commands Follows one step commands consistently  Safety/Judgement Decreased awareness of safety;Decreased awareness of deficits  Awareness Emergent (pt has R lateral lean in standing and sitting, but when asked reports L lateral lean)  Problem Solving Requires verbal cues;Difficulty sequencing;Requires tactile cues;Slow processing  General Comments Pt has had every session instruction on how to connect his battery packs, continues to need total assist even when asked to tell therapist what parts to connect where and what he needs to bring with him every time. (Increased cues to correctly connect battery pack system. Pt required VC to correctly identify proper cord to diconnect and to appropriately switch to battery system. Pt did remember to put R iarm in first)  Upper Extremity Assessment  Upper Extremity Assessment RUE deficits/detail  Lower Extremity Assessment  Lower Extremity Assessment Defer to PT evaluation (increased difficulty advancing RLE when ambulating)  ADL  Overall ADL's  Needs assistance/impaired  Grooming Set up;Supervision/safety;Bed level  Upper Body Dressing  Moderate assistance  Upper Body Dressing Details (indicate cue type and reason) to donn battery harness  Lower Body Dressing Maximal assistance;Sit to/from stand  Functional mobility during ADLs +2 for physical assistance;Minimal assistance;Cueing for safety;Cueing for sequencing  General ADL Comments Increased assistance with mobility this  session; posterior lean adn R bias  Bed Mobility  Overal bed mobility Needs Assistance  Bed Mobility Rolling;Sit to Sidelying;Sidelying to Sit  Rolling Min assist  Sidelying to sit Mod assist  Sit to  sidelying Mod assist  General bed mobility comments Mod assist for side to sit and sit to side transitions, two person assist used to return to bed to test L posterior canal in side lying.  He could likely be a one assist, but two used for positioning, safety, line awareness.  Balance  Overall balance assessment Needs assistance  Sitting-balance support Feet supported  Sitting balance-Leahy Scale Fair  Sitting balance - Comments R bias; unable to directly line self up with therapist when standing in front of him  Standing balance-Leahy Scale Poor  Standing balance comment bil UE support, R posterior lean in standing.  Vision- Assessment  Vision Assessment? Vision impaired- to be further tested in functional context  Additional Comments Pt with dysconjugate gaze and demonstrates difficulty with tracking; intermittently reporting double vision - vertical. Will further assess; low vision L eye however pt reports he needs to have his cataract removed from L eye; note over shooting when reaching for items demonstrating impaired depth perception  Transfers  Overall transfer level Needs assistance  Equipment used 2 person hand held assist  Transfers Sit to/from Stand  Sit to Stand Mod assist;+2 physical assistance  Stand pivot transfers Mod assist;+2 physical assistance  General transfer comment Two person mod assist to stand, lighter assist to pivot once standing.  Pt with dropping tremor throughout session which OT reports is worse.  Other Exercises  Other Exercises counter pressure exercises before standing - crossing BLE in seated position and completing isometrics x 5  OT - End of Session  Equipment Utilized During Treatment Other (comment) (LVAD equipment)  Activity Tolerance Treatment  limited secondary to medical complications (Comment);Patient limited by pain (near syncope event)  Patient left in bed;with call bell/phone within reach;with nursing/sitter in room;Other (comment) (hydro setting up)  Nurse Communication Mobility status;Other (comment) (concerns during session regarding cough, coordination and changes in vision)  OT Assessment/Plan  OT Plan Discharge plan remains appropriate;Frequency remains appropriate  OT Visit Diagnosis Unsteadiness on feet (R26.81);Other abnormalities of gait and mobility (R26.89);Muscle weakness (generalized) (M62.81);Other symptoms and signs involving cognitive function;Dizziness and giddiness (R42);Pain;Low vision, both eyes (H54.2)  Pain - part of body  (sacrum)  OT Frequency (ACUTE ONLY) Min 3X/week  Recommendations for Other Services Rehab consult  Follow Up Recommendations CIR;Supervision/Assistance - 24 hour  OT Equipment 3 in 1 bedside commode  AM-PAC OT "6 Clicks" Daily Activity Outcome Measure (Version 2)  Help from another person eating meals? 3  Help from another person taking care of personal grooming? 3  Help from another person toileting, which includes using toliet, bedpan, or urinal? 2  Help from another person bathing (including washing, rinsing, drying)? 2  Help from another person to put on and taking off regular upper body clothing? 3  Help from another person to put on and taking off regular lower body clothing? 2  6 Click Score 15  Acute Rehab OT Goals  Patient Stated Goal to go home and be with his grandchildren  OT Goal Formulation With patient  Time For Goal Achievement 11/14/20  Potential to Achieve Goals Good  ADL Goals  Pt Will Perform Eating with set-up;with adaptive utensils  Pt Will Perform Grooming with set-up;sitting  Pt Will Transfer to Toilet with supervision;bedside commode;ambulating  Pt/caregiver will Perform Home Exercise Program Increased ROM;Increased strength;Both right and left upper  extremity;With minimal assist;With written HEP provided  Additional ADL Goal #1 Pt will be able to recall sternal precautions  Additional ADL Goal #2 Pt will be able  to change out his lines from battery<>wall with min A  Additional ADL Goal #3 Pt will demonstrate functional pinch pattern to pick up 10 objects with no drops using thumb loop and buddy strap R hand  OT Time Calculation  OT Start Time (ACUTE ONLY) 1019  OT Stop Time (ACUTE ONLY) 1115  OT Time Calculation (min) 56 min  OT General Charges  $OT Visit 1 Visit  OT Treatments  $Self Care/Home Management  8-22 mins  $Therapeutic Activity 8-22 mins  Maurie Boettcher, OT/L   Acute OT Clinical Specialist [place] Pager [phone] Office [phone]

## 2020-11-09 NOTE — Progress Notes (Signed)
LVAD Coordinator Rounding Note:  Admitted 09/28/20 due to worsening heart failure symptoms. Recent COVID infection January 2022; did not take medications for 3-4 weeks.   S/p dental extractions 10/07/20 per Dr Benson Norway.  HM III LVAD implanted on 10/13/20 by Dr Cyndia Bent under Destination Therapy criteria.  Pt lying in bed this am, saying he wants to be re-positioned. BS nurse and myself re-positioned patient to left side.   Pt became dizzy during moving him in bed. PT reported patient had blurry vision, slightly more confused during walk this am. Dr. Aundra Dubin notified, scopolamine patch dc'd.   Pt reports continuing hydro therapy on sacral wound; says it is "very painful". He says he is receiving Fentanyl, but it "wears off" in 10 minutes. Discussed with BS nurse, she will give PO pain med @ 1 hour prior to therapy along with Fentanyl immediately prior to therapy.   Patient has intermittent dry cough, he says increasing in frequency. Worked with patient on using IS, explained being in bed increases risk of atelectasis and pneumonia. Pt says he "does not want that". Assisted patient along with BS nurse to chair at bedside. Pt stood, shuffled to chair, and sat without difficulty. Orthostatic BPs remained normal, pt denied dizziness or nausea.  Reports continued weakness/numbness of his right hand. Worked with patient on squeezing my hand and ball. Explained numbness may or may not resolve, but he must learn to use his hand regardless. Explained contractures can occur with lack of movement. Pt using hand to pick up sandwich and eat his lunch. Encouragement and praise offered.   Called son and updated per patient's request. Head CT ordered to assess for reported leaning to right, blurry vision, and slight confusion as reported per PT.   Vital signs: Temp: 98.5 HR: 102 Doppler: 82 Auto cuff: 98/84 (90) O2 Sat: 96% on RA Wt: 200.1>221.7>219.5.Marland KitchenMarland Kitchen>185.8>181>179.9>190.2>185.4>187.6 lbs    LVAD interrogation  reveals:  Speed: 5200 Flow: 4.0 Power: 3.7w PI: 4.9 Hematocrit: 29  Alarms: none Events: >120  Fixed speed: 5200 Low speed limit: 4900  Drive Line:  Existing VAD dressing removed and site care performed using sterile technique. Drive line exit site cleaned with Chlora prep applicators x 2,  allowed to dry, Cellurate applied to exit site and gauze dressing with silver strip re-applied; skin tears healing. I was careful not to apply tape over skin tears. NO skin prep used due to skin intolerance of same. Exit site partially incorporated,the velour is fully implanted at exit site. Small amount of tan drainage. No redness, tenderness, foul odor or rash noted. Drive line anchor replaced. Continue Monday, Wednesday, Friday dressing changes by VAD coordinator or nurse champion. Next dressing change due 11/11/20.   Chest tube sutures removed; sites completely healed, no bleeding or drainage noted.   Labs:  LDH trend: 444>670>575>571.....>809>983>382>505>397>673>419  INR trend: 1.4>1.7>1.8>1.7>1.6>1.8............1.8>1.9>1.7>2.4>2.8>2.6>3.0>3.1  Creatinine: 1.84>3.0>3.4>2.73.....>2.6>2.2>1.78>2.68>3.11>6.37>4.32>5.8>6.75>4.8  Anticoagulation Plan: -INR Goal: 2.0-2.5 -ASA Dose: 81 mg  Blood Products:  IntraOp:   1 Platelet  6 FFP  3 PRBC  1035 cell saver Postop:   10/13/20:   4 FFP  4 PRBC    10/14/20:  1 FFP  1 PRBC   10/16/20:  1 PRBC   10/18/20:  1 PRBC   10/16/20:  1 PRBC   10/18/20:  1 PRBC   10/27/20:  1 PRBC  Device: notified Ronalee Belts from [employer] to turn pts ICD back on 11/09/20 -  [employer] single -  Pacing: VVI 40 - Therapies: on 200 bpm  Arrythmias: VT in OR  requiring defibrillation   Renal: CRRT   - started 10/19/20   - stopped 10/26/20   - re-started 10/27/20  - stopped 10/30/20   iHD started 10/31/20   Infection: - 10/27/20 blood cultures - no growth x 5 days; final   Patient Education: 1. See notes above.   Plan/Recommendations:  1. Call VAD  coordinator for equipment or drive line issues 2. MWF drive line dressing change per VAD coordinator or nurse champion. Next dressing change due 11/11/20.   Zada Girt RN [place] Coordinator  Office: [phone]  24/7 Pager: [phone]

## 2020-11-09 NOTE — Progress Notes (Signed)
Patient refusing to stand to obtain his weight this morning d/t nausea and vomiting while sliding up in bed.  Will notify day shift RN to attempt.

## 2020-11-09 NOTE — Progress Notes (Signed)
Renal Navigator faxed Nephrology consult note and Cardiothoracic Surgery consult note to [facility] per Clinic Manager request.  Navigator to continue to follow closely.  Alphonzo Cruise, [place] Renal Navigator [phone]

## 2020-11-09 NOTE — Progress Notes (Signed)
Physical Therapy Wound Treatment Patient Details  Name: Marcus Johnson. MRN: [id number] Date of Birth: [birth year]  Today's Date: 11/09/2020 Time: 1110-1135 Time Calculation (min): 25 min  Subjective  Subjective Assessment Subjective: Pt pleasant and agreeable to hydrotherapy. Appreciated explanation for return of hydrotherapy. Patient and Family Stated Goals: did not state Date of Onset:  (unknown) Prior Treatments: dressing changes, prior hydrotherapy  Pain Score:  Pt was premedicated with IV pain meds prior to session and tolerated well with complaints of mild-moderate pain throughout.   Wound Assessment  Pressure Injury 10/16/20 Coccyx Mid Stage 2 -  Partial thickness loss of dermis presenting as a shallow open injury with a red, pink wound bed without slough. Blister that has drained, 2 cm X 4.5 cm (Active)  Dressing Type Barrier Film (skin prep);Gauze (Comment);Moist to moist;Santyl 11/09/20 1334  Dressing Changed;Dry;Intact;Clean 11/09/20 1334  Dressing Change Frequency Daily 11/09/20 1334  State of Healing Early/partial granulation 11/09/20 1334  Site / Wound Assessment Red;Yellow 11/09/20 1334  % Wound base Red or Granulating 80% 11/09/20 1334  % Wound base Yellow/Fibrinous Exudate 20% 11/09/20 1334  % Wound base Black/Eschar 0% 11/09/20 1334  % Wound base Other/Granulation Tissue (Comment) 0% 11/09/20 1334  Peri-wound Assessment Intact 11/09/20 1334  Wound Length (cm) 6 cm 11/08/20 1132  Wound Width (cm) 7.3 cm 11/08/20 1132  Wound Depth (cm) 0.1 cm 11/08/20 1132  Wound Surface Area (cm^2) 43.8 cm^2 11/08/20 1132  Wound Volume (cm^3) 4.38 cm^3 11/08/20 1132  Tunneling (cm) 0 11/08/20 1207  Undermining (cm) 0 11/08/20 1207  Margins Unattached edges (unapproximated) 11/09/20 1334  Drainage Amount Minimal 11/09/20 1334  Drainage Description Serosanguineous 11/09/20 1334  Treatment Debridement (Selective);Hydrotherapy (Pulse lavage);Packing (Saline gauze) 11/09/20  1334      Hydrotherapy Pulsed lavage therapy - wound location: coccyx Pulsed Lavage with Suction (psi): 8 psi Pulsed Lavage with Suction - Normal Saline Used: 1000 mL Pulsed Lavage Tip: Tip with splash shield Selective Debridement Selective Debridement - Location: coccyx Selective Debridement - Tools Used: Forceps,Scalpel Selective Debridement - Tissue Removed: yellow slough    Wound Assessment and Plan  Wound Therapy - Assess/Plan/Recommendations Wound Therapy - Clinical Statement: Progressing with debridement. Very small amounts able to be removed at a time. Viable tissue was bleeding upon removal of dressing before hydrotherapy began. Feel in time this wound will continue to heal. Hydrotherapy will continue to follow on a 3x/week basis. We will hold tomorrow, 11/10/2020, and return 11/11/2020 for another session and to assess necessity to continue with hydrotherapy treatments. Wound Therapy - Functional Problem List: decreased mobility Factors Delaying/Impairing Wound Healing: Immobility,Multiple medical problems Hydrotherapy Plan: Debridement,Dressing change,Patient/family education,Pulsatile lavage with suction Wound Therapy - Frequency: 6X / week Wound Therapy - Follow Up Recommendations: dressing changes by RN  Wound Therapy Goals- Improve the function of patient's integumentary system by progressing the wound(s) through the phases of wound healing (inflammation - proliferation - remodeling) by: Wound Therapy Goals - Improve the function of patient's integumentary system by progressing the wound(s) through the phases of wound healing by: Decrease Necrotic Tissue to: 0 Decrease Necrotic Tissue - Progress: Progressing toward goal Increase Granulation Tissue to: 100 Increase Granulation Tissue - Progress: Progressing toward goal Goals/treatment plan/discharge plan were made with and agreed upon by patient/family: Yes Time For Goal Achievement: 7 days Wound Therapy - Potential for  Goals: Good  Goals will be updated until maximal potential achieved or discharge criteria met.  Discharge criteria: when goals achieved, discharge from hospital, MD decision/surgical intervention,  no progress towards goals, refusal/missing three consecutive treatments without notification or medical reason.  GP     Charges PT Wound Care Charges $Wound Debridement up to 20 cm: < or equal to 20 cm $PT Hydrotherapy Dressing: 1 dressing $PT PLS Gun and Tip: 1 Supply $PT Hydrotherapy Visit: 1 Visit       Thelma Comp 11/09/2020, 1:40 PM   Rolinda Roan, PT, DPT Acute Rehabilitation Services Pager: [phone] Office: [phone]

## 2020-11-09 NOTE — Progress Notes (Signed)
Patient ID: Marcus Johnson., male   DOB: [birth year], 60 y.o.   MRN: [id number]     Advanced Heart Failure Rounding Note  PCP-Cardiologist: No primary care provider on file.   Subjective:    - 2/27 Milrinone switched to dobutamine 5 mcg.  - 2/28 Moved to ICU. Dobutamine increased to 7.5 mcg and Norepi 3 mcg added. Diuretics held.  - 3/1 Impella 5.5 placed.  - 3/4 Teeth removed - 3/10 HM3 LVAD placed - 3/13 VAD speed increased to 5500. [name] out  - 3/14 Nephrology consulted. Given 1UPRBCs  - 3/15 Started CRRT. Given 1UPRBCs.  - 3/23 VAD Speed turned down to 5300  - 3/23 CRRT and Milrinone Stopped. Back in Afib - 3/24 CRRT restarted.  - 3/27 CVVH off.  - 3/28 iHD - 3/29 iHD. Walked 75 feet!! - 3/20 Ramp Echo: Fixed speed: 5200 Low speed limit: 4900 - 4/3 S/P RIJ HD catheter.   Co-ox 50%.  Walked about 60 feet yesterday. Had HD yesterday.  Multiple PI events overnight after HD. MAP 70s.  Still getting nausea, now wearing scopolamine patch.    Has decubitus ulcer, getting treatment.   LVAD Interrogation HM 3: Speed: 5200 Flow: 4.5  PI: 3.7   Power: 3.7. Multiple PI events overnight. VAD interrogated personally. Parameters stable. LDH 226 INR 3.1  Objective:   Weight Range: 85.1 kg Body mass index is 26.54 kg/m.   Vital Signs:   Temp:  [97.6 F (36.4 C)-98.7 F (37.1 C)] 98.5 F (36.9 C) (04/06 0800) Pulse Rate:  [61-89] 81 (04/06 0800) Resp:  [14-24] 16 (04/06 0800) BP: (81-185)/(54-84) 98/84 (04/06 0800) SpO2:  [88 %-98 %] 96 % (04/06 0800) Last BM Date: 11/08/20  Weight change: Filed Weights   11/06/20 0414 11/07/20 0300  Weight: 86.5 kg 85.1 kg    Intake/Output:   Intake/Output Summary (Last 24 hours) at 11/09/2020 0839 Last data filed at 11/09/2020 0823 Gross per 24 hour  Intake 270 ml  Output 1500 ml  Net -1230 ml      Physical Exam   MAPs 70s  General: Well appearing this am. NAD.  HEENT: Normal. Neck: Supple, JVP 8-9 cm. Carotids OK.   Cardiac:  Mechanical heart sounds with LVAD hum present.  Lungs:  CTAB, normal effort.  Abdomen:  NT, ND, no HSM. No bruits or masses. +BS  LVAD exit site: Well-healed and incorporated. Dressing dry and intact. No erythema or drainage. Stabilization device present and accurately applied. Driveline dressing changed daily per sterile technique. Extremities:  Warm and dry. No cyanosis, clubbing, rash, or edema.  Neuro:  Alert & oriented x 3. Cranial nerves grossly intact. Moves all 4 extremities w/o difficulty. Affect pleasant      Telemetry   NSR 70s-80s personally reviewed.    Labs    CBC Recent Labs    11/08/20 0420 11/09/20 0329  WBC 8.2 8.3  HGB 8.7* 8.8*  HCT 28.1* 28.8*  MCV 90.1 89.4  PLT 266 546   Basic Metabolic Panel Recent Labs    11/08/20 0420 11/09/20 0329 11/09/20 0330  NA 136  --  134*  K 5.5*  --  4.9  CL 97*  --  96*  CO2 26  --  27  GLUCOSE 110*  --  73  BUN 70*  --  40*  CREATININE 6.75*  --  4.85*  CALCIUM 9.1  --  9.2  MG 2.4 2.3  --   PHOS 7.5*  --  5.4*  Liver Function Tests Recent Labs    11/08/20 0420 11/09/20 0330  ALBUMIN 2.7* 2.8*   No results for input(s): LIPASE, AMYLASE in the last 72 hours. Cardiac Enzymes No results for input(s): CKTOTAL, CKMB, CKMBINDEX, TROPONINI in the last 72 hours.  BNP: BNP (last 3 results) Recent Labs    10/20/20 0305 10/27/20 0020 11/02/20 2310  BNP 466.7* 774.9* 486.0*    ProBNP (last 3 results) No results for input(s): PROBNP in the last 8760 hours.   D-Dimer No results for input(s): DDIMER in the last 72 hours. Hemoglobin A1C No results for input(s): HGBA1C in the last 72 hours. Fasting Lipid Panel No results for input(s): CHOL, HDL, LDLCALC, TRIG, CHOLHDL, LDLDIRECT in the last 72 hours. Thyroid Function Tests No results for input(s): TSH, T4TOTAL, T3FREE, THYROIDAB in the last 72 hours.  Invalid input(s): FREET3  Other results:   Imaging    No results  found.   Medications:     Scheduled Medications: . (feeding supplement) PROSource Plus  30 mL Oral BID BM  . amiodarone  200 mg Oral Daily  . aspirin EC  81 mg Oral Daily  . Chlorhexidine Gluconate Cloth  6 each Topical Daily  . Chlorhexidine Gluconate Cloth  6 each Topical Q0600  . collagenase   Topical Daily  . [START ON 11/15/2020] darbepoetin (ARANESP) injection - DIALYSIS  100 mcg Intravenous Q Tue-HD  . feeding supplement  237 mL Oral TID BM  . gabapentin  100 mg Oral QHS  . insulin aspart  0-15 Units Subcutaneous TID WC  . insulin aspart  0-5 Units Subcutaneous QHS  . insulin glargine  15 Units Subcutaneous Daily  . metoCLOPramide  5 mg Oral TID AC  . midodrine  15 mg Oral TID WC  . multivitamin  1 tablet Oral QHS  . pantoprazole  40 mg Oral Daily  . polyethylene glycol  17 g Oral BID  . rosuvastatin  20 mg Oral Daily  . scopolamine  1 patch Transdermal Q72H  . senna-docusate  2 tablet Oral QHS  . sodium chloride flush  10-40 mL Intracatheter Q12H  . Warfarin - Pharmacist Dosing Inpatient   Does not apply q1600    Infusions: . sodium chloride    . sodium chloride    . ferric gluconate (FERRLECIT/NULECIT) IV Stopped (11/08/20 1847)  . lactated ringers Stopped (10/17/20 1655)    PRN Medications: sodium chloride, sodium chloride, acetaminophen, alteplase, dextrose, fentaNYL (SUBLIMAZE) injection, heparin, heparin, lidocaine (PF), lidocaine-prilocaine, ondansetron (ZOFRAN) IV, oxyCODONE, pentafluoroprop-tetrafluoroeth, senna-docusate, sodium chloride flush, traZODone, white petrolatum    Assessment/Plan   1.Acute on chronic systolic HF -> cardiogenic shock:ICM.HasMedtronic ICD. EF 15%. Low output persisted despite milrinone and then dobutamine with rise in creatinine to 2.5. Impella 5.5 placed 3/1.  Creatinine improved to 1.7 and HM3 LVAD was placed on 3/10. Developed post-op renal failure requiring CVVH. Now off milrinone and epinephrine. Last echo showed moderate  RV enlargement with mildly decreased RV function and VAD speed decreased to 5300 due to left-sided septum. CRRT stopped 3/23 but restarted 3/24. CVVH stopped again on 3/27. Ramp echo 3/30 with speed decreased to 5200. Tolerating iHD for volume management. Still with nausea.  MAP in 70s with orthostatic symptoms at times when he stands up.  - Continue midodrine 15 mg tid.  - Off sildenafil with orthostasis.  - Add compression stockings.  - HD again tomorrow for volume management.   - Add Reglan for nausea, already has scopolamine patch and Zofran.  2. LVAD:  3/13 VAD speed increased to 5500. 3/23 speed decreased to 5300 and decreased to 5200 on 3/30. VAD interrogated personally. Multiple PI events overnight after HD. LDH 226, INR 3.1.  - On warfarin, on ASA 81 daily. Discussed dosing with PharmD personally. - DL site ok 3. CAD s/p CABG2002:Last cath in 6/19 with patent grafts.  - Continue Crestor 40 mg daily.  4. AKI on CKD Stage 3b: Suspect that this is a combination of cardiorenal syndrome and diabetic nephropathy and possibly ATN. Developed post-op AKI. Started on CRRT 3/15. CVVH stopped 3/23 but restarted 3/24.  - Tolerating iHD. Next session tomorrow.  - S/P tunneled HD catheter.    5. Atrial flutter/fibrillation: s/p DC-CV 08/21/18. Maintaining NSR.     - Continue amio 200 mg daily.   6. Wound R Foot: Partial thickness skin loss noted. Cover with mepilex border and change every 5 days.  7. Right arm pain: Likely neuropathic from stretch of brachial plexus with Impella placement.  8. ID:  WBCs normal. Afebrile. Blood Cx- NGTD.  9. JF:HLKTGYB of very poor control but hgbA1ctrending down. Most recent 7.7%.  Insulin drip stopped 3/24. Blood glucose controlled on reduced Lantus and  sliding scale insulin to moderate.   - Diabetes coordinators have seen 10. Anemia: Has been stable. hgb 8.8 11. Deconditioning - PT/OT following, continue aggressive PT/OT efforts - CIR consulted 12.  Unstageable Pressure Ulcer on buttokcs.  - On hydrotherapy Mon-Sat.  - Continue reposition every 2 hours side lying position. Reposition frequently in the chair.   Loralie Champagne 11/09/2020 8:39 AM

## 2020-11-09 NOTE — Progress Notes (Signed)
11/09/2020 PT vestibular assessment revealed a mild L posterior canal BPPV (5-6 beats of nystagmus, sx settled in <10 seconds).  Unfortunately, we discovered this after preforming our whole session as we were getting back into bed, so we did not preform the Epley's maneuver to treat it today.  He also had some significant reports that we were un aware of in previous sessions: intermittent double vision, R lateral/posterior lean, decreased visual perception, increased assist needed and progressively decreasing gait distance this week.  His drop tremor has increased in it's intensity as well.  No obvious unilateral weakness in LEs, however, decreased ability to coordinate his legs during gait requiring two person hand held assist.  Per OT this is worse than she last saw him.  Medical team and RN made aware. Pt also with persistent cough. Plan to see to treat L posterior canal BPPV next session.     11/09/20 1114  PT Visit Information  Last PT Received On 11/09/20  Assistance Needed +2  PT/OT/SLP Co-Evaluation/Treatment Yes  Reason for Co-Treatment Complexity of the patient's impairments (multi-system involvement);Other (comment);For patient/therapist safety;To address functional/ADL transfers (LVAD therapist assisting non LVAD therapist)  PT goals addressed during session Mobility/safety with mobility;Balance;Strengthening/ROM (vestibular assessment)  History of Present Illness 60 y.o. male admitted 2/28 with ICM for LVAD workup. Impella placed 3/1. Heartmate 3 LVAD placed 3/10. CRRT started 3/15. CRRT held 3/23-3/24 then restarted until 3/27 with iHD started 3/28. PMHx: DM2, CAD s/p CABG 6759, systolic HF due to ischemic cardiomyopathy with EF 20-25% (echo 12/15), DM2 and CKD. He is s/p Medtronic single chamber ICD. Covid 1/22.  Subjective Data  Patient Stated Goal to go home and be with his grandchildren  Precautions  Precautions Fall;Sternal;Other (comment)  Precaution Booklet Issued Yes (comment)   Precaution Comments LVAD, sacral wound  Required Braces or Orthoses Other Brace  Other Brace thumb loop and buddy strap R hand  Pain Assessment  Pain Assessment Faces  Pain Score 8  Faces Pain Scale 8  Pain Location sacrum  Pain Descriptors / Indicators Grimacing;Guarding  Pain Intervention(s) Monitored during session;Limited activity within patient's tolerance;Repositioned;RN gave pain meds during session  Cognition  Arousal/Alertness Awake/alert  Behavior During Therapy [facility] for tasks assessed/performed  Overall Cognitive Status Impaired/Different from baseline  Memory Decreased short-term memory  Following Commands Follows one step commands consistently  Awareness Emergent (pt has R lateral lean in standing and sitting, but when asked reports L lateral lean)  Problem Solving Requires verbal cues;Difficulty sequencing;Requires tactile cues  General Comments Pt has had every session instruction on how to connect his battery packs, continues to need total assist even when asked to tell therapist what parts to connect where and what he needs to bring with him every time.  Bed Mobility  Overal bed mobility Needs Assistance  Bed Mobility Rolling;Sit to Sidelying;Sidelying to Sit  Rolling Min assist  Sidelying to sit Mod assist  Sit to sidelying Mod assist  General bed mobility comments Mod assist for side to sit and sit to side transitions, two person assist used to return to bed to test L posterior canal in side lying.  He could likely be a one assist, but two used for positioning, safety, line awareness.  Transfers  Overall transfer level Needs assistance  Equipment used 2 person hand held assist  Transfers Sit to/from Stand  Sit to Stand Mod assist;+2 physical assistance  Stand pivot transfers Mod assist;+2 physical assistance  General transfer comment Two person mod assist to stand, lighter assist  to pivot once standing.  Pt with dropping tremor throughout session which OT reports  is worse.  Ambulation/Gait  Ambulation/Gait assistance Mod assist;+2 physical assistance  Gait Distance (Feet) 35 Feet  Assistive device 2 person hand held assist  Gait Pattern/deviations Step-through pattern;Ataxic;Staggering right;Drifts right/left  General Gait Details right lateral lean in standing, increased intensity of "drop" tremor, and ataxic gait pattern, needing more assist and unable to go as far.  Pt followed with chair by RN tech and stopped responding, went rigid and buckled into the chair.  RN and medical team made aware.  Balance  Overall balance assessment Needs assistance  Sitting-balance support Feet supported;Bilateral upper extremity supported  Sitting balance-Leahy Scale Fair  Sitting balance - Comments supervision EOB with R lateral lean.  When asked, pt reported he was leaning to the left.  Postural control Posterior lean;Right lateral lean  Standing balance support Bilateral upper extremity supported  Standing balance-Leahy Scale Poor  Standing balance comment bil UE support, R posterior lean in standing.  PT - End of Session  Equipment Utilized During Treatment Gait belt  Activity Tolerance Patient limited by fatigue  Patient left in bed;with call bell/phone within reach;Other (comment) (with RN in room and hydrotherapy team coming in to treat his sacral wound.)  Nurse Communication Mobility status;Other (comment) (decreased)   PT - Assessment/Plan  PT Plan Current plan remains appropriate  PT Visit Diagnosis Other abnormalities of gait and mobility (R26.89);Muscle weakness (generalized) (M62.81);Difficulty in walking, not elsewhere classified (R26.2);Pain  Pain - Right/Left  (sacrum)  Pain - part of body  (sacrum)  PT Frequency (ACUTE ONLY) Min 4X/week  Follow Up Recommendations CIR  PT equipment 3in1 (PT)  AM-PAC PT "6 Clicks" Mobility Outcome Measure (Version 2)  Help needed turning from your back to your side while in a flat bed without using bedrails?  3  Help needed moving from lying on your back to sitting on the side of a flat bed without using bedrails? 2  Help needed moving to and from a bed to a chair (including a wheelchair)? 2  Help needed standing up from a chair using your arms (e.g., wheelchair or bedside chair)? 2  Help needed to walk in hospital room? 2  Help needed climbing 3-5 steps with a railing?  1  6 Click Score 12  Consider Recommendation of Discharge To: CIR/SNF/LTACH  PT Goal Progression  Progress towards PT goals Progressing toward goals  PT Time Calculation  PT Start Time (ACUTE ONLY) 1019  PT Stop Time (ACUTE ONLY) 1114  PT Time Calculation (min) (ACUTE ONLY) 55 min  PT General Charges  $$ ACUTE PT VISIT 1 Visit  PT Evaluation  $PT Re-evaluation 1 Re-eval  PT Treatments  $Therapeutic Activity 8-22 mins      11/09/20 0001  Vestibular Assessment  General Observation Left eye abducted, has a new cough, reports L eye strabismus is new, however, when speaking to LVAD staff who knew him prior to surgery, they state he has this PTA.  Symptom Behavior  Subjective history of current problem Pt reports swirling short duration dizziness  Type of Dizziness  Spinning  Frequency of Dizziness intermittent  Duration of Dizziness short  Symptom Nature Motion provoked;Positional  Aggravating Factors Rolling to left  Relieving Factors Closing eyes;Rest;Slow movements  Progression of Symptoms Worse  History of similar episodes  (no)  Oculomotor Exam  Oculomotor Alignment Abnormal (left eye abducted)  Spontaneous Absent  Gaze-induced  Absent  Smooth Pursuits Saccades (has great difficulty tracking,  reports intermittent double vision, perception is off)  Auditory  Comments grossly equal bil  Positional Testing  Sidelying Test Sidelying Left  Horizontal Canal Testing Horizontal Canal Left;Horizontal Canal Right  Sidelying Left  Sidelying Left Duration 6  Sidelying Left Symptoms Upbeat, left rotatory nystagmus   Horizontal Canal Right  Horizontal Canal Right Duration 0  Horizontal Canal Right Symptoms Normal  Horizontal Canal Left  Horizontal Canal Left Duration 0  Horizontal Canal Left Symptoms Other (comment) (reports it feels like he will "almost getting the swirls")     Verdene Lennert, PT, DPT  Acute Rehabilitation [phone] pager [phone] office

## 2020-11-10 ENCOUNTER — Telehealth (HOSPITAL_COMMUNITY): Payer: Self-pay | Admitting: Pharmacy Technician

## 2020-11-10 DIAGNOSIS — Z95811 Presence of heart assist device: Secondary | ICD-10-CM | POA: Diagnosis not present

## 2020-11-10 DIAGNOSIS — I5023 Acute on chronic systolic (congestive) heart failure: Secondary | ICD-10-CM | POA: Diagnosis not present

## 2020-11-10 LAB — CBC
HCT: 28.6 % — ABNORMAL LOW (ref 39.0–52.0)
Hemoglobin: 8.7 g/dL — ABNORMAL LOW (ref 13.0–17.0)
MCH: 27.3 pg (ref 26.0–34.0)
MCHC: 30.4 g/dL (ref 30.0–36.0)
MCV: 89.7 fL (ref 80.0–100.0)
Platelets: 251 10*3/uL (ref 150–400)
RBC: 3.19 MIL/uL — ABNORMAL LOW (ref 4.22–5.81)
RDW: 17.5 % — ABNORMAL HIGH (ref 11.5–15.5)
WBC: 9.7 10*3/uL (ref 4.0–10.5)
nRBC: 0 % (ref 0.0–0.2)

## 2020-11-10 LAB — COOXEMETRY PANEL
Carboxyhemoglobin: 1.7 % — ABNORMAL HIGH (ref 0.5–1.5)
Carboxyhemoglobin: 1.5 % (ref 0.5–1.5)
Methemoglobin: 0.7 % (ref 0.0–1.5)
Methemoglobin: 0.7 % (ref 0.0–1.5)
O2 Saturation: 44.2 %
O2 Saturation: 40.2 %
Total hemoglobin: 8.7 g/dL — ABNORMAL LOW (ref 12.0–16.0)
Total hemoglobin: 13 g/dL (ref 12.0–16.0)

## 2020-11-10 LAB — GLUCOSE, CAPILLARY
Glucose-Capillary: 119 mg/dL — ABNORMAL HIGH (ref 70–99)
Glucose-Capillary: 160 mg/dL — ABNORMAL HIGH (ref 70–99)
Glucose-Capillary: 188 mg/dL — ABNORMAL HIGH (ref 70–99)
Glucose-Capillary: 120 mg/dL — ABNORMAL HIGH (ref 70–99)
Glucose-Capillary: 170 mg/dL — ABNORMAL HIGH (ref 70–99)

## 2020-11-10 LAB — RENAL FUNCTION PANEL
Albumin: 2.7 g/dL — ABNORMAL LOW (ref 3.5–5.0)
Anion gap: 12 (ref 5–15)
BUN: 57 mg/dL — ABNORMAL HIGH (ref 6–20)
CO2: 26 mmol/L (ref 22–32)
Calcium: 8.9 mg/dL (ref 8.9–10.3)
Chloride: 96 mmol/L — ABNORMAL LOW (ref 98–111)
Creatinine, Ser: 6.09 mg/dL — ABNORMAL HIGH (ref 0.61–1.24)
GFR, Estimated: 10 mL/min — ABNORMAL LOW (ref >60)
Glucose, Bld: 174 mg/dL — ABNORMAL HIGH (ref 70–99)
Phosphorus: 6.4 mg/dL — ABNORMAL HIGH (ref 2.5–4.6)
Potassium: 5.2 mmol/L — ABNORMAL HIGH (ref 3.5–5.1)
Sodium: 134 mmol/L — ABNORMAL LOW (ref 135–145)

## 2020-11-10 LAB — PROTIME-INR
INR: 2.9 — ABNORMAL HIGH (ref 0.8–1.2)
Prothrombin Time: 29.2 seconds — ABNORMAL HIGH (ref 11.4–15.2)

## 2020-11-10 LAB — BRAIN NATRIURETIC PEPTIDE: B Natriuretic Peptide: 905.1 pg/mL — ABNORMAL HIGH (ref 0.0–100.0)

## 2020-11-10 LAB — MAGNESIUM: Magnesium: 2.3 mg/dL (ref 1.7–2.4)

## 2020-11-10 LAB — LACTATE DEHYDROGENASE: LDH: 209 U/L — ABNORMAL HIGH (ref 98–192)

## 2020-11-10 MED ORDER — HEPARIN SODIUM (PORCINE) 1000 UNIT/ML IJ SOLN
INTRAMUSCULAR | Status: AC
  Filled 2020-11-10: qty 4

## 2020-11-10 MED ORDER — NEPRO/CARBSTEADY PO LIQD
237.0000 mL | Freq: Two times a day (BID) | ORAL | Status: DC
  Administered 2020-11-10 – 2020-11-14 (×9): 237 mL via ORAL

## 2020-11-10 MED ORDER — WARFARIN 0.5 MG HALF TABLET
0.5000 mg | ORAL_TABLET | Freq: Once | ORAL | Status: AC
  Administered 2020-11-10: 0.5 mg via ORAL
  Filled 2020-11-10: qty 1

## 2020-11-10 NOTE — Progress Notes (Signed)
[facility] Progress Note    Assessment/ Plan:   Acute kidney injury on chronic kidney disease stage III: With underlying chronic kidney disease likely from diabetes/hypertension and worsening renal function from hemodynamically mediated ATN and cardiorenal syndrome. CRRT 3/15-3/22, 3/24-27; HD since 3/28. -Unfortunately, no signs of renal recovery at the moment - s/p [facility] 11/07/20 with IR, appreciate assistance - HD TTS schedule now - Outpt CLIP underway.    Acute exacerbation of chronic systolic heart failure with cardiogenic shock: LVAD, placed 3/10. Volume status/UF managed with RRT.   Per above UF to euvolemia.  midodrine and warfarin  Anemia:Likely secondary to chronic disease and exacerbated by recent surgical losses.  Transfuse PRBC as needed.  Iron deficient, has been repleted IV.  ESA seems to have fallen off MAR- reinstating for 100 mcg q Tuesday  Atrial fibrillation: per cardiology, on amiodarone  Protein calorie malnutrition, hypoalbuminemia: push protein, per primary  Pressure ulcer: getting hydrotherapy  DM II: per primary  Subjective:    Seen in room.  Having some nausea in bed.     Objective:   BP (!) 114/95 (BP Location: Left Arm)   Pulse 82   Temp 98.4 F (36.9 C) (Oral)   Resp 20   Ht 5' 10.5" (1.791 m)   Wt 84.2 kg   SpO2 92%   BMI 26.26 kg/m   Intake/Output Summary (Last 24 hours) at 11/10/2020 1103 Last data filed at 11/10/2020 0421 Gross per 24 hour  Intake 327 ml  Output 0 ml  Net 327 ml   Weight change:   Physical Exam: Gen: NAD, lying in bed CVS: mechanical hum throughout  Resp: clear  Abd: soft, + drive line Ext: no LE edema, R foot with wound which is dressed ACCESS: R TDC c/d/i  Imaging: CT HEAD WO CONTRAST  Result Date: 11/09/2020 CLINICAL DATA:  Dizziness. EXAM: CT HEAD WITHOUT CONTRAST TECHNIQUE: Contiguous axial images were obtained from the base of the skull through the vertex without intravenous contrast.  COMPARISON:  01/03/2012 FINDINGS: The study is mildly motion degraded. Brain: There is no evidence of an acute infarct, intracranial hemorrhage, mass, midline shift, or extra-axial fluid collection. The ventricles and sulci are within normal limits for age. Patchy hypodensities in the cerebral white matter bilaterally are new and nonspecific but compatible with mild chronic small vessel ischemic disease. Vascular: Calcified atherosclerosis at the skull base. No hyperdense vessel. Skull: No fracture or suspicious osseous lesion. Sinuses/Orbits: Small left maxillary sinus mucous retention cyst. Clear mastoid air cells. Unremarkable orbits. Other: None. IMPRESSION: 1. No evidence of acute intracranial abnormality. 2. Mild chronic small vessel ischemic disease. Electronically Signed   By: Logan Bores M.D.   On: 11/09/2020 17:12   DG CHEST PORT 1 VIEW  Result Date: 11/09/2020 CLINICAL DATA:  Cough. EXAM: PORTABLE CHEST 1 VIEW COMPARISON:  Chest x-ray 08/29/2020. FINDINGS: Dual-lumen catheter noted with tip over right atrium. Right PICC line noted with tip at cavoatrial junction. Cardiac pacer and left ventricular assist device in stable position. Prior CABG. Stable cardiomegaly. Pulmonary venous congestion. Mild bilateral interstitial prominence. A very mild component of CHF cannot be excluded. Left base atelectasis and or infiltrate. Small left pleural effusion. No pneumothorax. IMPRESSION: 1. Dual-lumen catheter with tip over right atrium. Right PICC line noted with tip at cavoatrial junction. Cardiac pacer and left ventricular assist device in stable position. 2. Prior CABG. Stable cardiomegaly. Pulmonary venous congestion. Mild bilateral interstitial prominence. A very mild component of CHF cannot be excluded. 3.  Left base atelectasis and or infiltrate. Small left pleural effusion. Electronically Signed   By: Marcello Moores  Register   On: 11/09/2020 13:46    Labs: BMET Recent Labs  Lab 11/04/20 0334  11/05/20 0500 11/06/20 0500 11/07/20 0923 11/08/20 0420 11/09/20 0330 11/10/20 0150  NA 135 133* 136 136 136 134* 134*  K 4.3 4.9 4.6 4.8 5.5* 4.9 5.2*  CL 98 95* 97* 96* 97* 96* 96*  CO2 27 25 28 28 26 27 26   GLUCOSE 125* 70 107* 79 110* 73 174*  BUN 50* 69* 41* 57* 70* 40* 57*  CREATININE 4.32* 5.75* 4.37* 5.85* 6.75* 4.85* 6.09*  CALCIUM 8.8* 9.2 9.1 9.3 9.1 9.2 8.9  PHOS 3.9 6.6* 5.2* 6.9* 7.5* 5.4* 6.4*   CBC Recent Labs  Lab 11/07/20 0444 11/08/20 0420 11/09/20 0329 11/10/20 0150  WBC 9.1 8.2 8.3 9.7  HGB 8.8* 8.7* 8.8* 8.7*  HCT 28.7* 28.1* 28.8* 28.6*  MCV 88.0 90.1 89.4 89.7  PLT 272 266 246 251    Medications:    . (feeding supplement) PROSource Plus  30 mL Oral BID BM  . amiodarone  200 mg Oral Daily  . aspirin EC  81 mg Oral Daily  . Chlorhexidine Gluconate Cloth  6 each Topical Daily  . Chlorhexidine Gluconate Cloth  6 each Topical Q0600  . collagenase   Topical Daily  . [START ON 11/15/2020] darbepoetin (ARANESP) injection - DIALYSIS  100 mcg Intravenous Q Tue-HD  . feeding supplement  237 mL Oral TID BM  . gabapentin  100 mg Oral QHS  . insulin aspart  0-15 Units Subcutaneous TID WC  . insulin aspart  0-5 Units Subcutaneous QHS  . insulin glargine  15 Units Subcutaneous Daily  . metoCLOPramide  5 mg Oral TID AC  . midodrine  15 mg Oral TID WC  . multivitamin  1 tablet Oral QHS  . pantoprazole  40 mg Oral Daily  . polyethylene glycol  17 g Oral BID  . rosuvastatin  20 mg Oral Daily  . senna-docusate  2 tablet Oral QHS  . sodium chloride flush  10-40 mL Intracatheter Q12H  . warfarin  0.5 mg Oral ONCE-1600  . Warfarin - Pharmacist Dosing Inpatient   Does not apply q1600      Madelon Lips, MD 11/10/2020, 11:03 AM

## 2020-11-10 NOTE — Progress Notes (Signed)
ANTICOAGULATION CONSULT NOTE - Follow Up Consult  Pharmacy Consult for Warfarin Indication: LVAD  Allergies  Allergen Reactions  . Meclizine Hcl Anaphylaxis and Swelling  . Ivabradine Nausea Only    Patient Measurements: Height: 5' 10.5" (179.1 cm) Weight: 84.2 kg (185 lb 10 oz) IBW/kg (Calculated) : 74.15 Heparin Dosing Weight: 89.2 kg  Vital Signs: Temp: 98.4 F (36.9 C) (04/07 0355) Temp Source: Oral (04/07 0355) BP: 114/95 (04/07 0810) Pulse Rate: 82 (04/07 0810)  Labs: Recent Labs    11/08/20 0420 11/09/20 0329 11/09/20 0330 11/10/20 0150  HGB 8.7* 8.8*  --  8.7*  HCT 28.1* 28.8*  --  28.6*  PLT 266 246  --  251  LABPROT 30.3* 30.7*  --  29.2*  INR 3.0* 3.1*  --  2.9*  CREATININE 6.75*  --  4.85* 6.09*    Estimated Creatinine Clearance: 13.7 mL/min (A) (by C-G formula based on SCr of 6.09 mg/dL (H)).   Medical History: Past Medical History:  Diagnosis Date  . AICD (automatic cardioverter/defibrillator) present    Single-chamber  implantable cardiac defibrillator - [employer]  . Atrial fibrillation ([place])   . Cataract    Mixed form OU  . CHF (congestive heart failure) ([place])   . Chronic kidney disease   . Chronic kidney disease (CKD), stage III (moderate) (HCC)   . Chronic systolic heart failure ([place])    a. Echo 5/13: Mild LVE, mild LVH, EF 10%, anteroseptal, lateral, apical AK, mild MR, mild LAE, moderately reduced RVSF, mild RAE, PASP 60;  b. 07/2014 Echo: EF 20-2%, diff HK, AKI of antsep/apical/mid-apicalinferior, mod reduced RV.  Marland Kitchen Coronary artery disease    a. s/p CABG 2002 b. LHC 5/13:  dLM 80%, LAD subtotally occluded, pCFX occluded, pRCA 50%, mid? Occlusion with high take off of the PDA with 70% multiple lesions-not bypassed and supplies collaterals to LAD, LIMA-IM/ramus ok, S-OM ok, S-PLA branches ok. Medical therapy was recommended  . COVID   . Diabetic retinopathy (HCC)    NPDR OD, PDR OS  . Dyspnea   . Gout    "on daily RX" (01/08/2018)  .  Hypertension   . Hypertensive retinopathy    OU  . Ischemic cardiomyopathy    a. Echo 5/13: Mild LVE, mild LVH, EF 10%, anteroseptal, lateral, apical AK, mild MR, mild LAE, moderately reduced RVSF, mild RAE, PASP 60;  b. 01/2012 s/p MDT [id number] Protecta XT VR AICD;  c. 07/2014 Echo: EF 20-2%, diff HK, AKI of antsep/apical/mid-apicalinferior, mod reduced RV.  Marland Kitchen MRSA (methicillin resistant Staphylococcus aureus)    Status post right foot plantar deep infection with MRSA status post  I&D 10/2008  . Myocardial infarction [facility])    "was told I'd had several before heart OR in 2002" (01/08/2018)  . Noncompliance   . Peripheral neuropathy   . Retinopathy, diabetic, background ([place])   . Syncope   . Type II diabetes mellitus ([place])    requiring insulin   . Vitreous hemorrhage, left (HCC)    and proliferative diabetic retinopathy    Assessment: 60 yo M s/p new LVAD implant with HeartMate III on 3/10. Warfarin started per MD on 3/12, and pharmacy asked to take over warfarin dosing.   INR 2.9 above goal today - very sensitive to even low doses. He is drinking 1 Ensure daily and eating well - per patient report. CBC stable with Hgb 8s, pltc 300. LDH stable 200s  Goal of Therapy:  INR 2-2.5 Monitor platelets by anticoagulation protocol: Yes  Plan:  Warfarin 0.5 mg x1 again today  Daily PT/INR, CBC, monitor s/sx bleeding   Bonnita Nasuti Pharm.D. CPP, BCPS Clinical Pharmacist [phone] 11/10/2020 12:08 PM     [facility] pharmacy phone numbers are listed on [email or web address]

## 2020-11-10 NOTE — Progress Notes (Signed)
LVAD Coordinator Rounding Note:  Admitted 09/28/20 due to worsening heart failure symptoms. Recent COVID infection January 2022; did not take medications for 3-4 weeks.   S/p dental extractions 10/07/20 per Dr Benson Norway.  HM III LVAD implanted on 10/13/20 by Dr Cyndia Bent under Destination Therapy criteria.  Pt sitting up in the chair this morning using his IS. Pt just finished walking with PT. Pt denies any issues with dizziness today.  There was concern pt may have had a stroke yesterday but CT of head is negative.  Pt states he was able to place himself on batteries this morning. Pt states that it takes him a little bit of time but that he was able to do it.   Pt scheduled for dialysis today.  Vital signs: Temp: 97.9 HR: 90 Doppler: 96 Auto cuff: 121/109 (115) O2 Sat: 97% on RA Wt: 200.1>221.7>219.5.Marland KitchenMarland Kitchen>185.8>181>179.9>190.2>185.4>187.6>185.6 lbs    LVAD interrogation reveals:  Speed: 5200 Flow: 4.4 Power: 3.7w PI: 3.5 Hematocrit: 28  Alarms: none Events: 1 PI today  Fixed speed: 5200 Low speed limit: 4900  Drive Line:  CDI. Drive line anchor secure. Continue Monday, Wednesday, Friday dressing changes by VAD coordinator or nurse champion. Next dressing change due 11/11/20.  Labs:  LDH trend: 444>670>575>571.....>246>244>245>220>209>198>226>209  INR trend: 1.4>1.7>1.8>1.7>1.6>1.8............1.8>1.9>1.7>2.4>2.8>2.6>3.0>3.1>2.9  Creatinine: 1.84>3.0>3.4>2.73.....>2.6>2.2>1.78>2.68>3.11>6.37>4.32>5.8>6.75>4.8>6.09  Anticoagulation Plan: -INR Goal: 2.0-2.5 -ASA Dose: 81 mg  Blood Products:  IntraOp:   1 Platelet  6 FFP  3 PRBC  1035 cell saver Postop:   10/13/20:   4 FFP  4 PRBC    10/14/20:  1 FFP  1 PRBC   10/16/20:  1 PRBC   10/18/20:  1 PRBC   10/16/20:  1 PRBC   10/18/20:  1 PRBC   10/27/20:  1 PRBC  Device: notified Ronalee Belts from [employer] to turn pts ICD back on 11/09/20 -  [employer] single -  Pacing: VVI 40 - Therapies: on 200 bpm  Arrythmias: VT in  OR requiring defibrillation   Renal: CRRT   - started 10/19/20   - stopped 10/26/20   - re-started 10/27/20  - stopped 10/30/20   iHD started 10/31/20   Infection: - 10/27/20 blood cultures - no growth x 5 days; final   Patient Education: 1. Ongoing.  Plan/Recommendations:  1. Call VAD coordinator for equipment or drive line issues 2. MWF drive line dressing change per VAD coordinator or nurse champion. Next dressing change due 11/11/20.  Tanda Rockers RN [place] Coordinator  Office: [phone]  24/7 Pager: [phone]

## 2020-11-10 NOTE — Telephone Encounter (Signed)
Called [employer] to check the status of the patient's application. Representative stated that the patient has a 3% OOP of $445.39 that has to be met in order to be approved. Called and left the patient message. Once he provides the OOP from the pharmacy showing that amount or greater, we will send it to [employer].  Elizabeth F Hines, CPhT   

## 2020-11-10 NOTE — Progress Notes (Addendum)
Patient ID: Lot Medford., male   DOB: [birth year], 60 y.o.   MRN: [id number]     Advanced Heart Failure Rounding Note  PCP-Cardiologist: No primary care provider on file.   Subjective:    - 2/27 Milrinone switched to dobutamine 5 mcg.  - 2/28 Moved to ICU. Dobutamine increased to 7.5 mcg and Norepi 3 mcg added. Diuretics held.  - 3/1 Impella 5.5 placed.  - 3/4 Teeth removed - 3/10 HM3 LVAD placed - 3/13 VAD speed increased to 5500. [name] out  - 3/14 Nephrology consulted. Given 1UPRBCs  - 3/15 Started CRRT. Given 1UPRBCs.  - 3/23 VAD Speed turned down to 5300  - 3/23 CRRT and Milrinone Stopped. Back in Afib - 3/24 CRRT restarted.  - 3/27 CVVH off.  - 3/28 iHD - 3/29 iHD. Walked 75 feet!! - 3/20 Ramp Echo: Fixed speed: 5200 Low speed limit: 4900 - 4/3 S/P RIJ HD catheter.   Yesterday w/ PT, he had dizziness, intermittent double vision R lateral/posterior lean and decreased visual perception. Head CT performed, negative for  acute intracranial abnormality. ? If SE of scopolamine patch (has been discontinued)   Co-ox lower today, 44% (early morning). Will repeat   Complaining of cough. CXR yesterday w/ Left base atelectasis and or infiltrate. Small left pleural effusion.  PCT 0.67 BNP 905 (up from 486)   Several runs of NSVT on tele up to 13 beats. K 5.2   Has decubitus ulcer, getting treatment.   Remains anuric. On iHD.   LVAD Interrogation HM 3: Speed: 5200 Flow: 4.5  PI: 3.6   Power: 3.6. No PI events.  VAD interrogated personally. Parameters stable.  LDH 209 INR 2.9  Objective:   Weight Range: 84.2 kg Body mass index is 26.26 kg/m.   Vital Signs:   Temp:  [98.4 F (36.9 C)-98.7 F (37.1 C)] 98.4 F (36.9 C) (04/07 0355) Pulse Rate:  [75-82] 82 (04/07 0810) Resp:  [12-20] 20 (04/07 0810) BP: (93-121)/(72-95) 114/95 (04/07 0810) SpO2:  [92 %-94 %] 92 % (04/07 0355) Weight:  [84.2 kg] 84.2 kg (04/07 0421) Last BM Date: 11/08/20  Weight  change: Filed Weights   11/07/20 0300 11/10/20 0421  Weight: 85.1 kg 84.2 kg    Intake/Output:   Intake/Output Summary (Last 24 hours) at 11/10/2020 0941 Last data filed at 11/10/2020 0421 Gross per 24 hour  Intake 327 ml  Output 0 ml  Net 327 ml      Physical Exam   MAPs elevated 90s-low 100s  General: Well appearing this am. Laying in bed. NAD.  HEENT: Normal. Neck: Supple, JVP 8-9 cm. Carotids OK.  Cardiac:  Mechanical heart sounds with LVAD hum present.  Lungs:  CTAB, normal effort. No wheezing  Abdomen:  NT, ND, no HSM. No bruits or masses. +BS  LVAD exit site: Well-healed and incorporated. Dressing dry and intact. No erythema or drainage. Stabilization device present and accurately applied. Driveline dressing changed daily per sterile technique. Extremities:  Warm and dry. No cyanosis, clubbing, rash, or edema.  Neuro:  Alert & oriented x 3. Cranial nerves grossly intact. Moves all 4 extremities w/o difficulty. Affect pleasant      Telemetry   NSR 70s-80s, runs of NSVT 8-13 beats personally reviewed.    Labs    CBC Recent Labs    11/09/20 0329 11/10/20 0150  WBC 8.3 9.7  HGB 8.8* 8.7*  HCT 28.8* 28.6*  MCV 89.4 89.7  PLT 246 008   Basic Metabolic Panel Recent  Labs    11/09/20 0329 11/09/20 0330 11/10/20 0150  NA  --  134* 134*  K  --  4.9 5.2*  CL  --  96* 96*  CO2  --  27 26  GLUCOSE  --  73 174*  BUN  --  40* 57*  CREATININE  --  4.85* 6.09*  CALCIUM  --  9.2 8.9  MG 2.3  --  2.3  PHOS  --  5.4* 6.4*   Liver Function Tests Recent Labs    11/09/20 0330 11/10/20 0150  ALBUMIN 2.8* 2.7*   No results for input(s): LIPASE, AMYLASE in the last 72 hours. Cardiac Enzymes No results for input(s): CKTOTAL, CKMB, CKMBINDEX, TROPONINI in the last 72 hours.  BNP: BNP (last 3 results) Recent Labs    10/27/20 0020 11/02/20 2310 11/10/20 0150  BNP 774.9* 486.0* 905.1*    ProBNP (last 3 results) No results for input(s): PROBNP in the last  8760 hours.   D-Dimer No results for input(s): DDIMER in the last 72 hours. Hemoglobin A1C No results for input(s): HGBA1C in the last 72 hours. Fasting Lipid Panel No results for input(s): CHOL, HDL, LDLCALC, TRIG, CHOLHDL, LDLDIRECT in the last 72 hours. Thyroid Function Tests No results for input(s): TSH, T4TOTAL, T3FREE, THYROIDAB in the last 72 hours.  Invalid input(s): FREET3  Other results:   Imaging    CT HEAD WO CONTRAST  Result Date: 11/09/2020 CLINICAL DATA:  Dizziness. EXAM: CT HEAD WITHOUT CONTRAST TECHNIQUE: Contiguous axial images were obtained from the base of the skull through the vertex without intravenous contrast. COMPARISON:  01/03/2012 FINDINGS: The study is mildly motion degraded. Brain: There is no evidence of an acute infarct, intracranial hemorrhage, mass, midline shift, or extra-axial fluid collection. The ventricles and sulci are within normal limits for age. Patchy hypodensities in the cerebral white matter bilaterally are new and nonspecific but compatible with mild chronic small vessel ischemic disease. Vascular: Calcified atherosclerosis at the skull base. No hyperdense vessel. Skull: No fracture or suspicious osseous lesion. Sinuses/Orbits: Small left maxillary sinus mucous retention cyst. Clear mastoid air cells. Unremarkable orbits. Other: None. IMPRESSION: 1. No evidence of acute intracranial abnormality. 2. Mild chronic small vessel ischemic disease. Electronically Signed   By: Logan Bores M.D.   On: 11/09/2020 17:12   DG CHEST PORT 1 VIEW  Result Date: 11/09/2020 CLINICAL DATA:  Cough. EXAM: PORTABLE CHEST 1 VIEW COMPARISON:  Chest x-ray 08/29/2020. FINDINGS: Dual-lumen catheter noted with tip over right atrium. Right PICC line noted with tip at cavoatrial junction. Cardiac pacer and left ventricular assist device in stable position. Prior CABG. Stable cardiomegaly. Pulmonary venous congestion. Mild bilateral interstitial prominence. A very mild  component of CHF cannot be excluded. Left base atelectasis and or infiltrate. Small left pleural effusion. No pneumothorax. IMPRESSION: 1. Dual-lumen catheter with tip over right atrium. Right PICC line noted with tip at cavoatrial junction. Cardiac pacer and left ventricular assist device in stable position. 2. Prior CABG. Stable cardiomegaly. Pulmonary venous congestion. Mild bilateral interstitial prominence. A very mild component of CHF cannot be excluded. 3. Left base atelectasis and or infiltrate. Small left pleural effusion. Electronically Signed   By: Marcello Moores  Register   On: 11/09/2020 13:46     Medications:     Scheduled Medications: . (feeding supplement) PROSource Plus  30 mL Oral BID BM  . amiodarone  200 mg Oral Daily  . aspirin EC  81 mg Oral Daily  . Chlorhexidine Gluconate Cloth  6 each  Topical Daily  . Chlorhexidine Gluconate Cloth  6 each Topical Q0600  . collagenase   Topical Daily  . [START ON 11/15/2020] darbepoetin (ARANESP) injection - DIALYSIS  100 mcg Intravenous Q Tue-HD  . feeding supplement  237 mL Oral TID BM  . gabapentin  100 mg Oral QHS  . insulin aspart  0-15 Units Subcutaneous TID WC  . insulin aspart  0-5 Units Subcutaneous QHS  . insulin glargine  15 Units Subcutaneous Daily  . metoCLOPramide  5 mg Oral TID AC  . midodrine  15 mg Oral TID WC  . multivitamin  1 tablet Oral QHS  . pantoprazole  40 mg Oral Daily  . polyethylene glycol  17 g Oral BID  . rosuvastatin  20 mg Oral Daily  . senna-docusate  2 tablet Oral QHS  . sodium chloride flush  10-40 mL Intracatheter Q12H  . Warfarin - Pharmacist Dosing Inpatient   Does not apply q1600    Infusions: . sodium chloride    . sodium chloride    . ferric gluconate (FERRLECIT/NULECIT) IV Stopped (11/08/20 1847)  . lactated ringers Stopped (10/17/20 1655)    PRN Medications: sodium chloride, sodium chloride, acetaminophen, alteplase, dextrose, fentaNYL (SUBLIMAZE) injection, heparin, heparin, lidocaine  (PF), lidocaine-prilocaine, ondansetron (ZOFRAN) IV, oxyCODONE, pentafluoroprop-tetrafluoroeth, senna-docusate, sodium chloride flush, traZODone, white petrolatum    Assessment/Plan   1.Acute on chronic systolic HF -> cardiogenic shock:ICM.Has[employer]tr[employer] ICD. EF 15%. Low output persisted despite milrinone and then dobutamine with rise in creatinine to 2.5. Impella 5.5 placed 3/1.  Creatinine improved to 1.7 and HM3 LVAD was placed on 3/10. Developed post-op renal failure requiring CVVH. Now off milrinone and epinephrine. Last echo showed moderate RV enlargement with mildly decreased RV function and VAD speed decreased to 5300 due to left-sided septum. CRRT stopped 3/23 but restarted 3/24. CVVH stopped again on 3/27. Ramp echo 3/30 with speed decreased to 5200. Tolerating iHD for volume management.  MAPs elevated 90s-low 100s  - reduce midodrine 10 mg tid on non HD days, continue 15 tid on HD days - Off sildenafil with orthostasis.  - Add compression stockings.  - HD again today for volume management.   - repeat co-ox, 44% on early morning labs  - c/w Reglan and zofran for nausea. Off scopolamine patch due to dizziness  2. LVAD: 3/13 VAD speed increased to 5500. 3/23 speed decreased to 5300 and decreased to 5200 on 3/30. VAD interrogated personally. LDH 209, INR 2.9  - On warfarin, on ASA 81 daily. Discussed dosing with PharmD personally. - DL site ok 3. CAD s/p CABG2002:Last cath in 6/19 with patent grafts.  - Continue Crestor 40 mg daily.  4. AKI on CKD Stage 3b: Suspect that this is a combination of cardiorenal syndrome and diabetic nephropathy and possibly ATN. Developed post-op AKI. Started on CRRT 3/15. CVVH stopped 3/23 but restarted 3/24.  - Tolerating iHD. Next session today - S/P tunneled HD catheter.    5. Atrial flutter/fibrillation: s/p DC-CV 08/21/18. Maintaining NSR.     - Continue amio 200 mg daily.   6. Wound R Foot: Partial thickness skin loss noted. Cover with mepilex  border and change every 5 days.  7. Right arm pain: Likely neuropathic from stretch of brachial plexus with Impella placement.  8. ID:  WBCs normal. Afebrile. Blood Cx- NGTD.  9. UD:JSHFWYO of very poor control but hgbA1ctrending down. Most recent 7.7%.  Insulin drip stopped 3/24. Blood glucose controlled on reduced Lantus and  sliding scale insulin to moderate.   -  Diabetes coordinators have seen 10. Anemia: Has been stable. hgb 8.7 11. Deconditioning - PT/OT following, continue aggressive PT/OT efforts - CIR consulted 12. Unstageable Pressure Ulcer on buttokcs.  - On hydrotherapy Mon-Sat.  - Continue reposition every 2 hours side lying position. Reposition frequently in the chair. - Cough: CXR yesterday w/ Left base atelectasis and or infiltrate. Small left pleural effusion.  PCT 0.67. BNP 905 (up from 486)  - plan HD today for fluid removal, may need to pull more off - ? Abx, defer to MD  13. NSVT - K 5.2, plan HD today  - monitor on tele  Marcus Johnson 11/10/2020 9:41 AM  Patient seen with PA, agree with the above note.   Patient doing better today, no nausea this morning.  MAP stable 80s-90s.  Walked farther with PT, 105 feet.  Minimal BPPV symptoms, PT did Epley maneuvers today.   General: Well appearing this am. NAD.  HEENT: Normal. Neck: Supple, JVP 7-8 cm. Carotids OK.  Cardiac:  Mechanical heart sounds with LVAD hum present.  Lungs:  CTAB, normal effort.  Abdomen:  NT, ND, no HSM. No bruits or masses. +BS  LVAD exit site: Well-healed and incorporated. Dressing dry and intact. No erythema or drainage. Stabilization device present and accurately applied. Driveline dressing changed daily per sterile technique. Extremities:  Warm and dry. No cyanosis, clubbing, rash, or edema.  Neuro:  Alert & oriented x 3. Cranial nerves grossly intact. Moves all 4 extremities w/o difficulty. Affect pleasant   Skin: Healing decubitus ulcer  Improved today, can continue Reglan  prn for nausea but did not need this morning.   BPPV symptoms improved, had Epley maneuvers with PT today.   Walked further, MAP stable.  LVAD parameters stable.  HD session today, think we can decrease midodrine tomorrow (will continue current dose pre-HD today).   Hopefully to CIR soon.   Loralie Champagne 11/10/2020 10:12 AM

## 2020-11-10 NOTE — Progress Notes (Signed)
OT Cancellation Note  Patient Details Name: Marcus Johnson. MRN: [id number] DOB: 01-30-1961   Cancelled Treatment:    Reason Eval/Treat Not Completed: Patient at procedure or test/ unavailable.  Pt in HD.  Will reattempt.  Nilsa Nutting., OTR/L Acute Rehabilitation Services Pager [phone] Office [phone]   Lucille Passy M 11/10/2020, 3:39 PM

## 2020-11-10 NOTE — Progress Notes (Signed)
Physical Therapy Treatment Patient Details Name: Marcus Johnson. MRN: [id number] DOB: [birth year] Today's Date: 11/10/2020    History of Present Illness 60 y.o. male admitted 2/28 with ICM for LVAD workup. Impella placed 3/1. Heartmate 3 LVAD placed 3/10. CRRT started 3/15. CRRT held 3/23-3/24 then restarted until 3/27 with iHD started 3/28. PMHx: DM2, CAD s/p CABG 7672, systolic HF due to ischemic cardiomyopathy with EF 20-25% (echo 12/15), DM2 and CKD. He is s/p Medtronic single chamber ICD. Covid 1/22.    PT Comments    Pt pleasant and reports sitting up to eat his breakfast EOB today. Initiated session with performing Epley for Left BPPV with pt stating only 5 sec of nystagmus with initial transition to supine with left head turn. No further reproduction of symptoms with completion of maneuver and no spinning during session. Pt very determined to progress gait this session with increased right lean from prior session and noted tremor with initial gait. Pt performed total of 5 gait attempts per pt request with limitation in dizziness with each trial without PI drop. Pt in chair end of session with cushions and agreeable to one hour in chair with RN aware.   Continues to requires assist and education for power transition with each trial.   HR 86-93, BP 114/95 (103 ) supine Speed5200, flow 5.3, PI 3.8 maintained throughout session, power 3.8    Follow Up Recommendations  CIR;Supervision/Assistance - 24 hour     Equipment Recommendations  3in1 (PT)    Recommendations for Other Services       Precautions / Restrictions Precautions Precautions: Fall;Sternal;Other (comment) Precaution Comments: LVAD, sacral wound, Left BPPV Restrictions Weight Bearing Restrictions: No    Mobility  Bed Mobility Overal bed mobility: Needs Assistance Bed Mobility: Supine to Sit;Rolling;Sidelying to Sit Rolling: Min assist   Supine to sit: Mod assist     General bed mobility comments:  performed supine to long sitting for epley manueuver with mod assist and mod assist to control sitting in long sitting due to sacral pain. Performed Epley with min assist for roll and rise from right side    Transfers Overall transfer level: Needs assistance   Transfers: Sit to/from Stand Sit to Stand: Min assist;Min guard         General transfer comment: min assist for initial stand from bed, minguard from repeated 4 stands from chair with cues for hand placement and safety  Ambulation/Gait Ambulation/Gait assistance: Min assist;+2 safety/equipment Gait Distance (Feet): 105 Feet Assistive device: 1 person hand held assist Gait Pattern/deviations: Step-through pattern;Staggering right;Decreased stride length   Gait velocity interpretation: 1.31 - 2.62 ft/sec, indicative of limited community ambulator General Gait Details: pt with initial quick gait speed with pt able to walk 105' prior to pt having tremor and right lean with chair pulled under pt. Pt without pause in standing or sitting as previously demonstrated and no drop in PI with this event. Pt then able to walk 4 additional trials of grossly 8' each with close chair follow and maintained right lean with addtional gait trials. LUe HHA assist with support of belt for postural control. Pt very determined to reach doors by 2c16   Stairs             Wheelchair Mobility    Modified Rankin (Stroke Patients Only)       Balance Overall balance assessment: Needs assistance   Sitting balance-Leahy Scale: Fair   Postural control: Right lateral lean Standing balance support: Single extremity supported Standing  balance-Leahy Scale: Poor Standing balance comment: LUE support in standing                            Cognition Arousal/Alertness: Awake/alert Behavior During Therapy: WFL for tasks assessed/performed Overall Cognitive Status: Impaired/Different from baseline Area of Impairment:  Attention;Memory;Following commands;Safety/judgement;Awareness;Problem solving                   Current Attention Level: Selective Memory: Decreased short-term memory Following Commands: Follows one step commands consistently Safety/Judgement: Decreased awareness of safety;Decreased awareness of deficits     General Comments: pt able to recall contents of black bag and sternal precautions. able to recall parts of power transition but out of order and max assist to transition to and from battery for sequence and physical management      Exercises      General Comments        Pertinent Vitals/Pain Pain Score: 5  Pain Location: sacrum Pain Descriptors / Indicators: Grimacing;Guarding Pain Intervention(s): Limited activity within patient's tolerance;Monitored during session;Repositioned    Home Living                      Prior Function            PT Goals (current goals can now be found in the care plan section) Progress towards PT goals: Progressing toward goals    Frequency    Min 4X/week      PT Plan Current plan remains appropriate    Co-evaluation              AM-PAC PT "6 Clicks" Mobility   Outcome Measure  Help needed turning from your back to your side while in a flat bed without using bedrails?: A Little Help needed moving from lying on your back to sitting on the side of a flat bed without using bedrails?: A Little Help needed moving to and from a bed to a chair (including a wheelchair)?: A Little Help needed standing up from a chair using your arms (e.g., wheelchair or bedside chair)?: A Little Help needed to walk in hospital room?: A Lot Help needed climbing 3-5 steps with a railing? : Total 6 Click Score: 15    End of Session Equipment Utilized During Treatment: Gait belt Activity Tolerance: Patient limited by fatigue Patient left: in chair;with call bell/phone within reach;with chair alarm set;with nursing/sitter in  room Nurse Communication: Mobility status PT Visit Diagnosis: Other abnormalities of gait and mobility (R26.89);Muscle weakness (generalized) (M62.81);Difficulty in walking, not elsewhere classified (R26.2);Pain;Unsteadiness on feet (R26.81)     Time: 5956-3875 PT Time Calculation (min) (ACUTE ONLY): 34 min  Charges:  $Gait Training: 8-22 mins $Therapeutic Activity: 8-22 mins                     Danashia Landers P, PT Acute Rehabilitation Services Pager: [phone] Office: [place] 11/10/2020, 10:00 AM

## 2020-11-10 NOTE — Progress Notes (Signed)
Brief Nutrition Follow Up   RD paged by dialysis nurse coordinator.   Patient taking 2-3 Ensure supplements per day. Now having issues with hyperkalemia. Will change to Nepro supplement which contains less potassium, more kcal, and more protein. Taste tested with patient at bedside and he is willing to make the switch.   Recommend continuing regular diet to provide patient with more options given high protein requirement and history of poor intake.   Mariana Single RD, LDN Clinical Nutrition Pager listed in [place]

## 2020-11-11 DIAGNOSIS — Z95811 Presence of heart assist device: Secondary | ICD-10-CM | POA: Diagnosis not present

## 2020-11-11 DIAGNOSIS — I5023 Acute on chronic systolic (congestive) heart failure: Secondary | ICD-10-CM | POA: Diagnosis not present

## 2020-11-11 LAB — GLUCOSE, CAPILLARY
Glucose-Capillary: 110 mg/dL — ABNORMAL HIGH (ref 70–99)
Glucose-Capillary: 164 mg/dL — ABNORMAL HIGH (ref 70–99)
Glucose-Capillary: 110 mg/dL — ABNORMAL HIGH (ref 70–99)
Glucose-Capillary: 153 mg/dL — ABNORMAL HIGH (ref 70–99)

## 2020-11-11 LAB — RENAL FUNCTION PANEL
Albumin: 2.7 g/dL — ABNORMAL LOW (ref 3.5–5.0)
Anion gap: 9 (ref 5–15)
BUN: 39 mg/dL — ABNORMAL HIGH (ref 6–20)
CO2: 28 mmol/L (ref 22–32)
Calcium: 9.1 mg/dL (ref 8.9–10.3)
Chloride: 97 mmol/L — ABNORMAL LOW (ref 98–111)
Creatinine, Ser: 4.78 mg/dL — ABNORMAL HIGH (ref 0.61–1.24)
GFR, Estimated: 13 mL/min — ABNORMAL LOW (ref >60)
Glucose, Bld: 110 mg/dL — ABNORMAL HIGH (ref 70–99)
Phosphorus: 4.7 mg/dL — ABNORMAL HIGH (ref 2.5–4.6)
Potassium: 4.9 mmol/L (ref 3.5–5.1)
Sodium: 134 mmol/L — ABNORMAL LOW (ref 135–145)

## 2020-11-11 LAB — MAGNESIUM: Magnesium: 2.1 mg/dL (ref 1.7–2.4)

## 2020-11-11 LAB — CBC
HCT: 29.3 % — ABNORMAL LOW (ref 39.0–52.0)
Hemoglobin: 8.8 g/dL — ABNORMAL LOW (ref 13.0–17.0)
MCH: 27.5 pg (ref 26.0–34.0)
MCHC: 30 g/dL (ref 30.0–36.0)
MCV: 91.6 fL (ref 80.0–100.0)
Platelets: 268 10*3/uL (ref 150–400)
RBC: 3.2 MIL/uL — ABNORMAL LOW (ref 4.22–5.81)
RDW: 17.6 % — ABNORMAL HIGH (ref 11.5–15.5)
WBC: 10.2 10*3/uL (ref 4.0–10.5)
nRBC: 0 % (ref 0.0–0.2)

## 2020-11-11 LAB — LACTATE DEHYDROGENASE: LDH: 237 U/L — ABNORMAL HIGH (ref 98–192)

## 2020-11-11 LAB — PROTIME-INR
INR: 2 — ABNORMAL HIGH (ref 0.8–1.2)
Prothrombin Time: 21.9 seconds — ABNORMAL HIGH (ref 11.4–15.2)

## 2020-11-11 MED ORDER — MIDODRINE HCL 5 MG PO TABS
15.0000 mg | ORAL_TABLET | ORAL | Status: DC
  Administered 2020-11-12: 15 mg via ORAL
  Filled 2020-11-11: qty 3

## 2020-11-11 MED ORDER — SORBITOL 70 % SOLN
30.0000 mL | Freq: Once | Status: AC
  Administered 2020-11-11: 30 mL via ORAL
  Filled 2020-11-11: qty 30

## 2020-11-11 MED ORDER — WARFARIN SODIUM 1 MG PO TABS
1.0000 mg | ORAL_TABLET | Freq: Every day | ORAL | Status: DC
  Administered 2020-11-11: 1 mg via ORAL
  Filled 2020-11-11 (×2): qty 1

## 2020-11-11 MED ORDER — MIDODRINE HCL 5 MG PO TABS: 10.0000 mg | ORAL_TABLET | Freq: Three times a day (TID) | ORAL | Status: DC

## 2020-11-11 MED ORDER — MIDODRINE HCL 5 MG PO TABS
10.0000 mg | ORAL_TABLET | ORAL | Status: DC
  Administered 2020-11-11 (×2): 10 mg via ORAL
  Filled 2020-11-11 (×2): qty 2

## 2020-11-11 NOTE — Progress Notes (Signed)
CSW attempted to visit patient at bedside although was busy with therapy at the time of visit. CSW continues to follow for needs. Raquel Sarna, [place], [place]

## 2020-11-11 NOTE — Plan of Care (Signed)
Problem: Education: Goal: Knowledge of General Education information will improve Description: Including pain rating scale, medication(s)/side effects and non-pharmacologic comfort measures Outcome: Progressing   Problem: Health Behavior/Discharge Planning: Goal: Ability to manage health-related needs will improve Outcome: Progressing   Problem: Clinical Measurements: Goal: Ability to maintain clinical measurements within normal limits will improve Outcome: Progressing Goal: Will remain free from infection Outcome: Progressing Goal: Diagnostic test results will improve Outcome: Progressing Goal: Cardiovascular complication will be avoided Outcome: Progressing   Problem: Activity: Goal: Risk for activity intolerance will decrease Outcome: Progressing   Problem: Nutrition: Goal: Adequate nutrition will be maintained Outcome: Progressing   Problem: Coping: Goal: Level of anxiety will decrease Outcome: Progressing   Problem: Elimination: Goal: Will not experience complications related to bowel motility Outcome: Progressing   Problem: Pain Managment: Goal: General experience of comfort will improve Outcome: Progressing   Problem: Safety: Goal: Ability to remain free from injury will improve Outcome: Progressing   Problem: Skin Integrity: Goal: Risk for impaired skin integrity will decrease Outcome: Progressing   Problem: Education: Goal: Ability to demonstrate management of disease process will improve Outcome: Progressing Goal: Ability to verbalize understanding of medication therapies will improve Outcome: Progressing Goal: Individualized Educational Video(s) Outcome: Progressing   Problem: Activity: Goal: Capacity to carry out activities will improve Outcome: Progressing   Problem: Cardiac: Goal: Ability to achieve and maintain adequate cardiopulmonary perfusion will improve Outcome: Progressing   Problem: Education: Goal: Knowledge of disease or  condition will improve Outcome: Progressing Goal: Understanding of medication regimen will improve Outcome: Progressing Goal: Individualized Educational Video(s) Outcome: Progressing   Problem: Activity: Goal: Ability to tolerate increased activity will improve Outcome: Progressing   Problem: Cardiac: Goal: Ability to achieve and maintain adequate cardiopulmonary perfusion will improve Outcome: Progressing   Problem: Health Behavior/Discharge Planning: Goal: Ability to safely manage health-related needs after discharge will improve Outcome: Progressing   Problem: Education: Goal: Understanding of CV disease, CV risk reduction, and recovery process will improve Outcome: Progressing Goal: Individualized Educational Video(s) Outcome: Progressing   Problem: Activity: Goal: Ability to return to baseline activity level will improve Outcome: Progressing   Problem: Cardiovascular: Goal: Ability to achieve and maintain adequate cardiovascular perfusion will improve Outcome: Progressing Goal: Vascular access site(s) Level 0-1 will be maintained Outcome: Progressing   Problem: Health Behavior/Discharge Planning: Goal: Ability to safely manage health-related needs after discharge will improve Outcome: Progressing   Problem: Education: Goal: Knowledge of disease and its progression will improve Outcome: Progressing   Problem: Health Behavior/Discharge Planning: Goal: Ability to manage health-related needs will improve Outcome: Progressing   Problem: Clinical Measurements: Goal: Complications related to the disease process or treatment will be avoided or minimized Outcome: Progressing Goal: Dialysis access will remain free of complications Outcome: Progressing   Problem: Activity: Goal: Activity intolerance will improve Outcome: Progressing   Problem: Fluid Volume: Goal: Fluid volume balance will be maintained or improved Outcome: Progressing   Problem:  Nutritional: Goal: Ability to make appropriate dietary choices will improve Outcome: Progressing   Problem: Respiratory: Goal: Respiratory symptoms related to disease process will be avoided Outcome: Progressing   Problem: Self-Concept: Goal: Body image disturbance will be avoided or minimized Outcome: Progressing   Problem: Urinary Elimination: Goal: Progression of disease will be identified and treated Outcome: Progressing   Problem: Education: Goal: Knowledge of disease and its progression will improve Outcome: Progressing   Problem: Health Behavior/Discharge Planning: Goal: Ability to manage health-related needs will improve Outcome: Progressing   Problem: Clinical Measurements: Goal: Complications  related to the disease process or treatment will be avoided or minimized Outcome: Progressing Goal: Dialysis access will remain free of complications Outcome: Progressing   Problem: Activity: Goal: Activity intolerance will improve Outcome: Progressing   Problem: Fluid Volume: Goal: Fluid volume balance will be maintained or improved Outcome: Progressing   Problem: Nutritional: Goal: Ability to make appropriate dietary choices will improve Outcome: Progressing   Problem: Respiratory: Goal: Respiratory symptoms related to disease process will be avoided Outcome: Progressing   Problem: Self-Concept: Goal: Body image disturbance will be avoided or minimized Outcome: Progressing

## 2020-11-11 NOTE — Progress Notes (Addendum)
Occupational Therapy Treatment Patient Details Name: Marcus Johnson. MRN: [id number] DOB: [birth year] Today's Date: 11/11/2020    History of present illness 60 y.o. male admitted 2/28 with ICM for LVAD workup. Impella placed 3/1. Heartmate 3 LVAD placed 3/10. CRRT started 3/15. CRRT held 3/23-3/24 then restarted until 3/27 with iHD started 3/28. PMHx: DM2, CAD s/p CABG 0272, systolic HF due to ischemic cardiomyopathy with EF 20-25% (echo 12/15), DM2 and CKD. He is s/p Medtronic single chamber ICD. Covid 1/22.   OT comments  Excellent session today. Cognition improved and affect much brighter. Focus of session on neuro rehab RUE in addition to modifying connector to have pt work on connecting/disconnecting as well as functional use of R hand. Power cord connector modified with red tubing and power cord threaded between ring and middle finger to prevent slippage. While wearing thumb loop and buddy strap, With min A and mod vc pt able to connect/disconnect.  Difficulty lining up "half moons". Will plan to modify with marking system and place splint foam on tubing to increase traction. Pt very excited about progress and very appreciative. "I will give you 110%" Continue to recommend CIR for rehab.   Follow Up Recommendations  CIR;Supervision/Assistance - 24 hour    Equipment Recommendations  3 in 1 bedside commode    Recommendations for Other Services Rehab consult    Precautions / Restrictions Precautions Precautions: Fall;Sternal;Other (comment) Precaution Comments: LVAD, sacral wound, Left BPPV Required Braces or Orthoses: Other Brace Other Brace: thumb loop and buddy strap R hand          Balance     Sitting balance-Leahy Scale: Good                                     ADL either performed or assessed with clinical judgement   ADL                                         General ADL Comments: focus of session on connecting/disconnecting  connector I "made it to the bathroom today to poop"; pt states he "almost fell" because he "got weak"    Vision  will further assess     Perception  "depth perception issues   Praxis      Cognition Arousal/Alertness: Awake/alert Behavior During Therapy: WFL for tasks assessed/performed Overall Cognitive Status: Impaired/Different from baseline                     Current Attention Level: Selective Memory: Decreased short-term memory Following Commands: Follows one step commands consistently Safety/Judgement: Decreased awareness of deficits Awareness: Emergent Problem Solving: Requires verbal cues General Comments: Brighter affect; improved problem solving        Exercises General Exercises - Upper Extremity Shoulder Flexion: AROM;AAROM;Right;20 reps;Supine Shoulder Extension: AROM;AAROM;Right;20 reps;Supine Shoulder ABduction: AROM;Right;15 reps Elbow Flexion: AROM;Right;10 reps Elbow Extension: AROM;AAROM;Right;20 reps;Supine;Other (comment) (terminal extension stretch) Wrist Flexion: AAROM;PROM;Right;15 reps Digit Composite Flexion: AROM;AAROM;Right;15 reps (composite wrist flexion with elbow extension) Other Exercises Other Exercises: scapualr depression x 15; retraction x 15; protraction x 15 (supine pushing up toward ceiling) Other Exercises: ER x 15 against minimal resistance Other Exercises: pinch/grip strengthening with/without R hand strapping system; squeeze ball x 15; clapping; traferring ball from one hand to the other Other Exercises: Medbridgge HEP issued adn began  reviewing   Shoulder Instructions       General Comments      Pertinent Vitals/ Pain       Pain Assessment: Faces Faces Pain Scale: Hurts little more Pain Location: sacrum; RUE Pain Descriptors / Indicators: Grimacing;Guarding Pain Intervention(s): Limited activity within patient's tolerance;Repositioned  Home Living                                           Prior Functioning/Environment              Frequency  Min 3X/week        Progress Toward Goals  OT Goals(current goals can now be found in the care plan section)  Progress towards OT goals: Progressing toward goals  Acute Rehab OT Goals Patient Stated Goal: to go home and be with his grandchildren OT Goal Formulation: With patient Time For Goal Achievement: 11/14/20 Potential to Achieve Goals: Good ADL Goals Pt Will Perform Eating: with set-up;with adaptive utensils Pt Will Perform Grooming: with set-up;sitting Pt Will Transfer to Toilet: with supervision;bedside commode;ambulating Pt/caregiver will Perform Home Exercise Program: Increased ROM;Increased strength;Both right and left upper extremity;With minimal assist;With written HEP provided Additional ADL Goal #1: Pt will be able to recall sternal precautions Additional ADL Goal #2: Pt will be able to change out his lines from battery<>wall with min A Additional ADL Goal #3: Pt will demonstrate functional pinch pattern to pick up 10 objects with no drops using thumb loop and buddy strap R hand  Plan Discharge plan remains appropriate;Frequency remains appropriate    Co-evaluation                 AM-PAC OT "6 Clicks" Daily Activity     Outcome Measure   Help from another person eating meals?: A Little Help from another person taking care of personal grooming?: A Little Help from another person toileting, which includes using toliet, bedpan, or urinal?: A Lot Help from another person bathing (including washing, rinsing, drying)?: A Lot Help from another person to put on and taking off regular upper body clothing?: A Little Help from another person to put on and taking off regular lower body clothing?: A Lot 6 Click Score: 15    End of Session    OT Visit Diagnosis: Unsteadiness on feet (R26.81);Other abnormalities of gait and mobility (R26.89);Muscle weakness (generalized) (M62.81);Other symptoms and signs  involving cognitive function;Dizziness and giddiness (R42);Pain;Low vision, both eyes (H54.2) Pain - Right/Left: Right Pain - part of body: Arm (sacrum)   Activity Tolerance Patient tolerated treatment well   Patient Left in bed;with call bell/phone within reach;with nursing/sitter in room;Other (comment)   Nurse Communication Other (comment) (encourage use of RUE;strapping system)        Time: 8185-6314 OT Time Calculation (min): 54 min  Charges: OT General Charges $OT Visit: 1 Visit OT Treatments $Self Care/Home Management : 8-22 mins $Therapeutic Activity: 8-22 mins $Neuromuscular Re-education: 23-37 mins  Maurie Boettcher, OT/L   Acute OT Clinical Specialist [place] Pager [phone] Office [phone]    [facility] 11/11/2020, 5:27 PM

## 2020-11-11 NOTE — Progress Notes (Signed)
Physical Therapy Treatment Patient Details Name: Marcus Johnson. MRN: [id number] DOB: 10-01-1960 Today's Date: 11/11/2020    History of Present Illness 60 y.o. male admitted 2/28 with ICM for LVAD workup. Impella placed 3/1. Heartmate 3 LVAD placed 3/10. CRRT started 3/15. CRRT held 3/23-3/24 then restarted until 3/27 with iHD started 3/28. PMHx: DM2, CAD s/p CABG 9983, systolic HF due to ischemic cardiomyopathy with EF 20-25% (echo 12/15), DM2 and CKD. He is s/p Medtronic single chamber ICD. Covid 1/22.    PT Comments    Pt very pleasant and returning to pre LVAD cognition with joking, laughing and improved recall of LVAD parts. Pt required mod cues for power transition today rather than max and improving with ability to control and manipulate power cords. Pt with significantly increased gait today without tremor or right lean. Pt educated for HEP and treated with Epley maneuver prior to getting OOB as pt reporting decreased spinning and encouraged exiting toward right side of bed to limit repositioning of canaliths to left. Will continue to follow.   HR 91 Speed 5200, flow 4.3, PI 4.1>3.5 with gait, power 3.7 103/91 (97) pre gait    Follow Up Recommendations  CIR;Supervision/Assistance - 24 hour     Equipment Recommendations  3in1 (PT)    Recommendations for Other Services       Precautions / Restrictions Precautions Precautions: Fall;Sternal;Other (comment) Precaution Comments: LVAD, sacral wound, Left BPPV    Mobility  Bed Mobility Overal bed mobility: Needs Assistance Bed Mobility: Supine to Sit;Rolling;Sidelying to Sit     Supine to sit: Mod assist     General bed mobility comments: performed supine to long sitting for epley manueuver with mod assist and mod assist to control sitting in long sitting due to sacral pain. Performed Epley with min assist for roll and rise from right side    Transfers Overall transfer level: Needs assistance   Transfers: Sit  to/from Stand Sit to Stand: Min guard         General transfer comment: minguard x 3 trials from bed and chair today with appropriate hand placement and guarding for safety  Ambulation/Gait Ambulation/Gait assistance: Min assist;+2 safety/equipment Gait Distance (Feet): 140 Feet Assistive device: 1 person hand held assist Gait Pattern/deviations: Step-through pattern;Decreased stride length   Gait velocity interpretation: >2.62 ft/sec, indicative of community ambulatory General Gait Details: pt with excellent progression with gait today. He walked 130' then 140' after seated rest all with LUE HHA and close chair follow. Pt still struggles to state need to sit and needs therapist closely monitoring for signs of fatigue. No jerking or dizziness with gait with PI 3.5 after first trial   Stairs             Wheelchair Mobility    Modified Rankin (Stroke Patients Only)       Balance Overall balance assessment: Needs assistance   Sitting balance-Leahy Scale: Good     Standing balance support: Single extremity supported Standing balance-Leahy Scale: Poor Standing balance comment: LUE support in standing                            Cognition Arousal/Alertness: Awake/alert Behavior During Therapy: WFL for tasks assessed/performed Overall Cognitive Status: Impaired/Different from baseline Area of Impairment: Attention;Memory;Following commands;Safety/judgement;Awareness;Problem solving                   Current Attention Level: Selective   Following Commands: Follows one step commands  consistently Safety/Judgement: Decreased awareness of deficits   Problem Solving: Requires verbal cues General Comments: pt recalled all contents of black bag, all sternal precautions and required mod cues for appropriate power transition to and from battery with mod physical assist      Exercises General Exercises - Lower Extremity Short Arc Quad: AROM;Both;Supine;20  reps Hip Flexion/Marching: Both;Seated;20 reps;AROM    General Comments        Pertinent Vitals/Pain Pain Score: 6  Pain Location: sacrum Pain Descriptors / Indicators: Grimacing;Guarding Pain Intervention(s): Limited activity within patient's tolerance;Monitored during session;Repositioned    Home Living                      Prior Function            PT Goals (current goals can now be found in the care plan section) Progress towards PT goals: Progressing toward goals    Frequency    Min 4X/week      PT Plan Current plan remains appropriate    Co-evaluation              AM-PAC PT "6 Clicks" Mobility   Outcome Measure  Help needed turning from your back to your side while in a flat bed without using bedrails?: A Little Help needed moving from lying on your back to sitting on the side of a flat bed without using bedrails?: A Little Help needed moving to and from a bed to a chair (including a wheelchair)?: A Little Help needed standing up from a chair using your arms (e.g., wheelchair or bedside chair)?: A Little Help needed to walk in hospital room?: A Lot Help needed climbing 3-5 steps with a railing? : Total 6 Click Score: 15    End of Session Equipment Utilized During Treatment: Gait belt Activity Tolerance: Patient tolerated treatment well Patient left: in chair;with call bell/phone within reach;with chair alarm set Nurse Communication: Mobility status PT Visit Diagnosis: Other abnormalities of gait and mobility (R26.89);Muscle weakness (generalized) (M62.81);Difficulty in walking, not elsewhere classified (R26.2);Pain;Unsteadiness on feet (R26.81)     Time: 0825-0903 PT Time Calculation (min) (ACUTE ONLY): 38 min  Charges:  $Gait Training: 23-37 mins $Therapeutic Activity: 8-22 mins                     Marcus Johnson P, PT Acute Rehabilitation Services Pager: [phone] Office: [place] 11/11/2020, 11:51 AM

## 2020-11-11 NOTE — Progress Notes (Signed)
Physical Therapy Wound Treatment Patient Details  Name: Marcus Johnson. MRN: [id number] Date of Birth: 10-11-1960  Today's Date: 11/11/2020 Time: 5397-6734 Time Calculation (min): 27 min  Subjective  Subjective Assessment Subjective: Pt pleasant and agreeable to hydrotherapy. Patient and Family Stated Goals: did not state Date of Onset:  (unknown) Prior Treatments: dressing changes, prior hydrotherapy  Pain Score:  Pt was premedicated with IV pain meds and tolerated treatment well without complaints of pain.   Wound Assessment  Pressure Injury 10/16/20 Coccyx Mid Stage 2 -  Partial thickness loss of dermis presenting as a shallow open injury with a red, pink wound bed without slough. Blister that has drained, 2 cm X 4.5 cm (Active)  Dressing Type ABD;Barrier Film (skin prep);Gauze (Comment);Moist to moist;Santyl 11/11/20 1356  Dressing Changed;Clean;Dry;Intact 11/11/20 1356  Dressing Change Frequency Daily 11/11/20 1356  State of Healing Early/partial granulation 11/11/20 1356  Site / Wound Assessment Pink;Yellow 11/11/20 1356  % Wound base Red or Granulating 85% 11/11/20 1356  % Wound base Yellow/Fibrinous Exudate 15% 11/11/20 1356  % Wound base Black/Eschar 0% 11/11/20 1356  % Wound base Other/Granulation Tissue (Comment) 0% 11/11/20 1356  Peri-wound Assessment Intact 11/11/20 1356  Wound Length (cm) 6 cm 11/08/20 1132  Wound Width (cm) 7.3 cm 11/08/20 1132  Wound Depth (cm) 0.1 cm 11/08/20 1132  Wound Surface Area (cm^2) 43.8 cm^2 11/08/20 1132  Wound Volume (cm^3) 4.38 cm^3 11/08/20 1132  Tunneling (cm) 0 11/08/20 1207  Undermining (cm) 0 11/08/20 1207  Margins Unattached edges (unapproximated) 11/11/20 1356  Drainage Amount Minimal 11/11/20 1356  Drainage Description Serosanguineous 11/11/20 1356  Treatment Debridement (Selective);Hydrotherapy (Pulse lavage);Packing (Saline gauze) 11/11/20 1356      Hydrotherapy Pulsed lavage therapy - wound location:  coccyx Pulsed Lavage with Suction (psi): 8 psi Pulsed Lavage with Suction - Normal Saline Used: 1000 mL Pulsed Lavage Tip: Tip with splash shield Selective Debridement Selective Debridement - Location: coccyx Selective Debridement - Tools Used: Forceps,Scalpel Selective Debridement - Tissue Removed: yellow slough    Wound Assessment and Plan  Wound Therapy - Assess/Plan/Recommendations Wound Therapy - Clinical Statement: Progressing with debridement. Very small amounts able to be removed at a time. Viable tissue was bleeding upon removal of dressing before hydrotherapy began. Feel in time this wound will continue to heal. Hydrotherapy will continue to follow on a 3x/week basis. We will hold tomorrow, 11/10/2020, and return 11/11/2020 for another session and to assess necessity to continue with hydrotherapy treatments. Wound Therapy - Functional Problem List: decreased mobility Factors Delaying/Impairing Wound Healing: Immobility,Multiple medical problems Hydrotherapy Plan: Debridement,Dressing change,Patient/family education,Pulsatile lavage with suction Wound Therapy - Frequency: 3X / week Wound Therapy - Follow Up Recommendations: dressing changes by RN  Wound Therapy Goals- Improve the function of patient's integumentary system by progressing the wound(s) through the phases of wound healing (inflammation - proliferation - remodeling) by: Wound Therapy Goals - Improve the function of patient's integumentary system by progressing the wound(s) through the phases of wound healing by: Decrease Necrotic Tissue to: 0 Decrease Necrotic Tissue - Progress: Progressing toward goal Increase Granulation Tissue to: 100 Increase Granulation Tissue - Progress: Progressing toward goal Goals/treatment plan/discharge plan were made with and agreed upon by patient/family: Yes Time For Goal Achievement: 7 days Wound Therapy - Potential for Goals: Good  Goals will be updated until maximal potential achieved  or discharge criteria met.  Discharge criteria: when goals achieved, discharge from hospital, MD decision/surgical intervention, no progress towards goals, refusal/missing three consecutive treatments without notification  or medical reason.  GP     Charges PT Wound Care Charges $Wound Debridement up to 20 cm: < or equal to 20 cm $PT Hydrotherapy Dressing: 1 dressing $PT PLS Gun and Tip: 1 Supply $PT Hydrotherapy Visit: 1 Visit       Thelma Comp 11/11/2020, 2:02 PM   Rolinda Roan, PT, DPT Acute Rehabilitation Services Pager: [phone] Office: [phone]

## 2020-11-11 NOTE — Progress Notes (Signed)
Renal Navigator faxed updated PT notes to [facility] for MD/Dr. Adin Hector review and to keep him updated.   Alphonzo Cruise, [place] Renal Navigator [phone]

## 2020-11-11 NOTE — PMR Pre-admission (Signed)
PMR Admission Coordinator Pre-Admission Assessment  Patient: Marcus Johnson. is an 60 y.o., male MRN: [id number] DOB: 05-15-1961 Height: 5' 10.5" (179.1 cm) Weight: 83.3 kg  Insurance Information HMO: Yes    PPO:       PCP:       IPA:       80/20:       OTHER:  Group [id number] PRIMARY: [employer]      Policy#: [id number]      Subscriber: patient CM Name: Marianna Fuss      Phone#: [phone]     Fax#: [phone] Pre-Cert#: [id number] [place] for CIR given by Marianna Fuss for admit 4/11 with updates due 4/18 to fax listed above   Employer: Not working, disabled Benefits:  Phone #: [phone]     Name:  Eff. Date: 06/06/2020     Deduct: $0      Out of Pocket Max: $3900      Life Max: N/A CIR: $295/day days 1-6      SNF: $0 days 1-20, $188 days 21-41, $0 days 42-100 Outpatient: medical necessity     Co-Pay: $10/visit Home Health: 100%      Co-Pay: none DME: 80%     Co-Pay: 20% Providers: in network  SECONDARY:        Policy#:       Phone#:    Development worker, community:       Phone#:   The Engineer, petroleum" for patients in Inpatient Rehabilitation Facilities with attached "Privacy Act [place] Records" was provided and verbally reviewed with: Patient  Emergency Contact Information Contact Information    Name Relation Home Work Mobile   Blecher,[name] [phone]  [phone]   kassim, guertin Brother   [phone]      Current Medical History  Patient Admitting Diagnosis: Debility post LVAD  History of Present Illness: A 60 year old gentleman with a history of diabetes with complications of diabetic retinopathy and peripheral neuropathy, stage III chronic kidney disease, atrial fibrillation on Eliquis, and chronic systolic heart failure status post coronary bypass graft surgery in 2002 by Dr. Darcey Nora.  He has been followed by Dr. Aundra Dubin for several years and had a CPX 05/09/2018 showing severe functional limitation due to heart failure.  His ejection  fraction at that time is 20% with moderately decreased RV systolic function.  He has required milrinone multiple times in the past for low output heart failure and discussions were held about proceeding with LVAD/transplant therapy but he has always been resistant.  He developed COVID-19 infection in January 2022 with predominantly GI symptoms with nausea and vomiting for several weeks and was unable to take his heart failure medicines.  He had some dehydration with worsening renal function.  His nausea and vomiting improved and he started his medications again but his weight increased 16 pounds and he continued to have some lightheadedness and worsening shortness of breath and orthopnea.  He was admitted and started on milrinone and IV diuretic.  He continued to have a low Co-ox and a rise in his creatinine.  His creatinine today was up to 2.5 from his admission creatinine of 1.8.  His milrinone was increased to 0.375 on 226 and then yesterday was switched to dobutamine 5 mcg.  His Co-ox today is 57.5%.  His last right heart cath in June 2019 showed patent grafts and elevated filling pressures with low cardiac output.  His most recent echocardiogram on 09/29/2020 showed an ejection fraction of 20 to 25% with global hypokinesis.  His LV internal dimension was 5.6 cm.  There was mild mitral regurgitation and no aortic regurgitation.  There was severe tricuspid regurgitation.  Right ventricular systolic function was felt to be normal with severely elevated pulmonary artery systolic pressure.  The patient is single and lives alone.  He has a son and a brother who could be his primary caregivers.  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  He is retired on disability.  He is a non-smoker and denies alcohol or drug use. Impella placed 3/1. Heartmate 3 LVAD placed 3/10. CRRT started 3/15. CRRT held 3/23-3/24 then restarted until 3/27 with iHD started 3/28.  He was  evaluated by PT and OT with recommendations for an inpatient rehab admission.    Patient's medical record from [facility] has been reviewed by the rehabilitation admission coordinator and physician.  Past Medical History  Past Medical History:  Diagnosis Date  . AICD (automatic cardioverter/defibrillator) present    Single-chamber  implantable cardiac defibrillator - Medtronic  . Atrial fibrillation ([place])   . Cataract    Mixed form OU  . CHF (congestive heart failure) ([place])   . Chronic kidney disease   . Chronic kidney disease (CKD), stage III (moderate) (HCC)   . Chronic systolic heart failure ([place])    a. Echo 5/13: Mild LVE, mild LVH, EF 10%, anteroseptal, lateral, apical AK, mild MR, mild LAE, moderately reduced RVSF, mild RAE, PASP 60;  b. 07/2014 Echo: EF 20-2%, diff HK, AKI of antsep/apical/mid-apicalinferior, mod reduced RV.  Marland Kitchen Coronary artery disease    a. s/p CABG 2002 b. LHC 5/13:  dLM 80%, LAD subtotally occluded, pCFX occluded, pRCA 50%, mid? Occlusion with high take off of the PDA with 70% multiple lesions-not bypassed and supplies collaterals to LAD, LIMA-IM/ramus ok, S-OM ok, S-PLA branches ok. Medical therapy was recommended  . COVID   . Diabetic retinopathy (HCC)    NPDR OD, PDR OS  . Dyspnea   . Gout    "on daily RX" (01/08/2018)  . Hypertension   . Hypertensive retinopathy    OU  . Ischemic cardiomyopathy    a. Echo 5/13: Mild LVE, mild LVH, EF 10%, anteroseptal, lateral, apical AK, mild MR, mild LAE, moderately reduced RVSF, mild RAE, PASP 60;  b. 01/2012 s/p MDT [id number] Protecta XT VR AICD;  c. 07/2014 Echo: EF 20-2%, diff HK, AKI of antsep/apical/mid-apicalinferior, mod reduced RV.  Marland Kitchen MRSA (methicillin resistant Staphylococcus aureus)    Status post right foot plantar deep infection with MRSA status post  I&D 10/2008  . Myocardial infarction [facility])    "was told I'd had several before heart OR in 2002" (01/08/2018)  . Noncompliance   . Peripheral neuropathy   .  Retinopathy, diabetic, background ([place])   . Syncope   . Type II diabetes mellitus ([place])    requiring insulin   . Vitreous hemorrhage, left (HCC)    and proliferative diabetic retinopathy    Family History   family history includes Diabetes in his father; Hypertension in his mother.  Prior Rehab/Hospitalizations Has the patient had prior rehab or hospitalizations prior to admission? Yes  Has the patient had major surgery during 100 days prior to admission? Yes   Current Medications  Current Facility-Administered Medications:  .  (feeding supplement) PROSource Plus liquid 30 mL, 30 mL, Oral, BID BM, Clegg, Amy D, NP, 30 mL at 11/14/20 0800 .  0.9 %  sodium chloride infusion, 100 mL, Intravenous,  PRN, Clegg, Amy D, NP .  0.9 %  sodium chloride infusion, 100 mL, Intravenous, PRN, Clegg, Amy D, NP .  acetaminophen (TYLENOL) tablet 1,000 mg, 1,000 mg, Oral, Q6H PRN, Clegg, Amy D, NP, 1,000 mg at 11/03/20 1003 .  alteplase (CATHFLO ACTIVASE) injection 2 mg, 2 mg, Intracatheter, Once PRN, Clegg, Amy D, NP .  amiodarone (PACERONE) tablet 200 mg, 200 mg, Oral, Daily, Larey Dresser, MD, 200 mg at 11/14/20 0801 .  aspirin EC tablet 81 mg, 81 mg, Oral, Daily, Clegg, Amy D, NP, 81 mg at 11/14/20 0801 .  Chlorhexidine Gluconate Cloth 2 % PADS 6 each, 6 each, Topical, Daily, Clegg, Amy D, NP, 6 each at 11/12/20 1700 .  Chlorhexidine Gluconate Cloth 2 % PADS 6 each, 6 each, Topical, Q0600, Clegg, Amy D, NP, 6 each at 11/14/20 0456 .  collagenase (SANTYL) ointment, , Topical, Daily, Gaye Pollack, MD, Given at 11/14/20 0802 .  [START ON 11/15/2020] Darbepoetin Alfa (ARANESP) injection 100 mcg, 100 mcg, Intravenous, Q Tue-HD, Madelon Lips, MD .  dextrose 50 % solution 0-50 mL, 0-50 mL, Intravenous, PRN, Clegg, Amy D, NP, 50 mL at 10/17/20 0420 .  feeding supplement (NEPRO CARB STEADY) liquid 237 mL, 237 mL, Oral, BID BM, Gaye Pollack, MD, 237 mL at 11/14/20 0802 .  fentaNYL (SUBLIMAZE)  injection 12.5-25 mcg, 12.5-25 mcg, Intravenous, Q4H PRN, Consuelo Pandy, PA-C, 12.5 mcg at 11/11/20 1103 .  ferric gluconate (NULECIT) 125 mg in sodium chloride 0.9 % 100 mL IVPB, 125 mg, Intravenous, Q T,Th,Sa-HD, Justin Mend, MD, Stopped at 11/10/20 1826 .  gabapentin (NEURONTIN) capsule 100 mg, 100 mg, Oral, QHS, Clegg, Amy D, NP, 100 mg at 11/13/20 2236 .  heparin injection 1,000 Units, 1,000 Units, Dialysis, PRN, Clegg, Amy D, NP, 1,000 Units at 11/10/20 1700 .  heparin injection 1,000-6,000 Units, 1,000-6,000 Units, CRRT, PRN, Clegg, Amy D, NP .  insulin aspart (novoLOG) injection 0-15 Units, 0-15 Units, Subcutaneous, TID WC, Larey Dresser, MD, 2 Units at 11/13/20 1740 .  insulin aspart (novoLOG) injection 0-5 Units, 0-5 Units, Subcutaneous, QHS, Clegg, Amy D, NP, 2 Units at 11/02/20 2156 .  insulin glargine (LANTUS) injection 15 Units, 15 Units, Subcutaneous, Daily, Larey Dresser, MD, 15 Units at 11/14/20 0801 .  lactated ringers infusion, , Intravenous, Continuous, Clegg, Amy D, NP, Stopped at 10/17/20 1655 .  lidocaine (PF) (XYLOCAINE) 1 % injection 5 mL, 5 mL, Intradermal, PRN, Clegg, Amy D, NP .  lidocaine-prilocaine (EMLA) cream 1 application, 1 application, Topical, PRN, Clegg, Amy D, NP .  metoCLOPramide (REGLAN) tablet 5 mg, 5 mg, Oral, TID AC, Larey Dresser, MD, 5 mg at 11/14/20 4970 .  [START ON 11/15/2020] midodrine (PROAMATINE) tablet 5 mg, 5 mg, Oral, 3 times per day on Tue Thu Sat, McLean, Dalton S, MD .  multivitamin (RENA-VIT) tablet 1 tablet, 1 tablet, Oral, QHS, Clegg, Amy D, NP, 1 tablet at 11/13/20 2236 .  ondansetron (ZOFRAN) injection 4 mg, 4 mg, Intravenous, Q6H PRN, Clegg, Amy D, NP, 4 mg at 11/12/20 0625 .  oxyCODONE (Oxy IR/ROXICODONE) immediate release tablet 5-10 mg, 5-10 mg, Oral, Q3H PRN, Clegg, Amy D, NP, 10 mg at 11/13/20 2235 .  pantoprazole (PROTONIX) EC tablet 40 mg, 40 mg, Oral, Daily, Clegg, Amy D, NP, 40 mg at 11/14/20 0801 .   pentafluoroprop-tetrafluoroeth (GEBAUERS) aerosol 1 application, 1 application, Topical, PRN, Clegg, Amy D, NP .  polyethylene glycol (MIRALAX / GLYCOLAX) packet 17 g, 17  g, Oral, BID, Gaye Pollack, MD, 17 g at 11/14/20 0801 .  rosuvastatin (CRESTOR) tablet 20 mg, 20 mg, Oral, Daily, Larey Dresser, MD, 20 mg at 11/14/20 0801 .  senna-docusate (Senokot-S) tablet 2 tablet, 2 tablet, Oral, QHS PRN, Clegg, Amy D, NP .  senna-docusate (Senokot-S) tablet 2 tablet, 2 tablet, Oral, QHS, Larey Dresser, MD, 2 tablet at 11/10/20 2204 .  sodium chloride flush (NS) 0.9 % injection 10-40 mL, 10-40 mL, Intracatheter, Q12H, Clegg, Amy D, NP, 10 mL at 11/14/20 0802 .  sodium chloride flush (NS) 0.9 % injection 10-40 mL, 10-40 mL, Intracatheter, PRN, Clegg, Amy D, NP, 20 mL at 10/26/20 1132 .  sodium zirconium cyclosilicate (LOKELMA) packet 10 g, 10 g, Oral, Once, Dwana Melena, MD .  traZODone (DESYREL) tablet 50 mg, 50 mg, Oral, QHS PRN, Clegg, Amy D, NP, 50 mg at 11/13/20 2236 .  Warfarin - Pharmacist Dosing Inpatient, , Does not apply, q1600, Clegg, Amy D, NP, Given at 11/13/20 1741 .  white petrolatum (VASELINE) gel, , Topical, PRN, Clegg, Amy D, NP  Patients Current Diet:  Diet Order            Diet regular Room service appropriate? Yes; Fluid consistency: Thin  Diet effective now                 Precautions / Restrictions Precautions Precautions: Fall,Sternal,Other (comment) Precaution Booklet Issued: Yes (comment) Precaution Comments: LVAD, sacral wound, Left BPPV Other Brace: thumb loop and buddy strap R hand Restrictions Weight Bearing Restrictions: No RUE Weight Bearing: Partial weight bearing RUE Partial Weight Bearing Percentage or Pounds:  (IMPELLA) Other Position/Activity Restrictions: sternal precautions   Has the patient had 2 or more falls or a fall with injury in the past year? No.  Reports one fall off his bed with no injury.  Prior Activity Level Community (5-7x/wk):  Went out 3-4 days a week, was driving.  Prior Functional Level Self Care: Did the patient need help bathing, dressing, using the toilet or eating? Independent  Indoor Mobility: Did the patient need assistance with walking from room to room (with or without device)? Independent  Stairs: Did the patient need assistance with internal or external stairs (with or without device)? Independent  Functional Cognition: Did the patient need help planning regular tasks such as shopping or remembering to take medications? Independent  Home Assistive Devices / Equipment Home Assistive Devices/Equipment: None Home Equipment: None  Prior Device Use: Indicate devices/aids used by the patient prior to current illness, exacerbation or injury? None of the above  Current Functional Level Cognition  Overall Cognitive Status: Impaired/Different from baseline Current Attention Level: Selective Orientation Level: Oriented X4 Following Commands: Follows one step commands consistently Safety/Judgement: Decreased awareness of deficits General Comments: pt recalling all sternal precautions, all contents of black bag, able to verbalize sequence for power transition but needs min cues for accuracy and mod assist due to weakness in RUE    Extremity Assessment (includes Sensation/Coordination)  Upper Extremity Assessment: RUE deficits/detail RUE Deficits / Details: increased edema with really stiff 2nd/3rd digits, cannot close hand fully (2/3); also edema throughout rest of arm RUE Coordination: decreased fine motor,decreased gross motor LUE Deficits / Details: increased edema can fully close hand but not tight--can hold ice pop and work on squeezing it. LUE Coordination: decreased fine motor,decreased gross motor  Lower Extremity Assessment: Defer to PT evaluation (increased difficulty advancing RLE when ambulating)    ADLs  Overall ADL's : Needs assistance/impaired  Eating/Feeding: Supervision/ safety,Set  up Eating/Feeding Details (indicate cue type and reason): fed self using R had @ 10 bites of potatoes using red tubing on spoon; thumb loop/buddy strap intact Grooming: Set up,Supervision/safety,Bed level Upper Body Bathing: Set up,Supervision/ safety,Bed level Lower Body Bathing: Moderate assistance,Bed level Lower Body Bathing Details (indicate cue type and reason): A for pericare and lower legs. painful when reaching (sacral pain) Upper Body Dressing : Moderate assistance Upper Body Dressing Details (indicate cue type and reason): to donn battery harness Lower Body Dressing: Maximal assistance,Sit to/from stand Toilet Transfer: Minimal assistance Toilet Transfer Details (indicate cue type and reason): simulated Toileting- Clothing Manipulation and Hygiene: Maximal assistance Functional mobility during ADLs: Minimal assistance (power up to stand) General ADL Comments: focus of session on connecting/disconnecting connector    Mobility  Overal bed mobility: Needs Assistance Bed Mobility: Rolling,Sidelying to Sit Rolling: Min guard Sidelying to sit: Min guard Supine to sit: Min assist Sit to supine: Min assist Sit to sidelying: Mod assist General bed mobility comments: pt able to roll to right and rise from side with only cues and increased time, no spinning with transition    Transfers  Overall transfer level: Needs assistance Equipment used: 2 person hand held assist Transfers: Sit to/from Stand Sit to Stand: Min guard Stand pivot transfers: Mod assist,+2 physical assistance General transfer comment: minguard x 3 trials with good hand placement with guarding to rise. Min assist to sit due to pt with jerking and reports of weakness with need to sit    Ambulation / Gait / Stairs / Wheelchair Mobility  Ambulation/Gait Ambulation/Gait assistance: Min assist,+2 safety/equipment Gait Distance (Feet): 60 Feet Assistive device: 1 person hand held assist Gait Pattern/deviations:  Step-through pattern,Decreased stride length General Gait Details: pt walked 60', 70', 60' respectively with close chair follow and limited distances by pt having jerking and leg weakness with need for assist to sit. With each trial PI maintained at  3.4 nd HR 90. Jerking continued grossly 1 min with each sitting trial and then pt able to reattempt gait. Return to room via chair. Gait velocity: functional Gait velocity interpretation: 1.31 - 2.62 ft/sec, indicative of limited community ambulator    Posture / Balance Dynamic Sitting Balance Sitting balance - Comments: EOB without assist with midline posture Balance Overall balance assessment: Needs assistance Sitting-balance support: Feet supported Sitting balance-Leahy Scale: Good Sitting balance - Comments: EOB without assist with midline posture Postural control: Right lateral lean Standing balance support: Single extremity supported Standing balance-Leahy Scale: Poor Standing balance comment: LUE support in standing; HHA    Special needs/care consideration Continuous Drip IV  KVO, Dialysis: Hemodialysis Tuesday, Thursday and Saturday, Oxygen Incentive spirometry, Skin Coccyx wound with dressing, Right chest, abdoman and arm incisions with dressings.  LVAD in place with dressing. and Special service needs Has an LVAD which is new    Previous Home Environment (from acute therapy documentation) Living Arrangements: Other relatives,Children Available Help at Discharge: Family,Available 24 hours/day Type of Home: Apartment Home Layout: One level Home Access: Stairs to enter Entrance Stairs-Rails: Surveyor, mining of Steps: 3 flights Bathroom Shower/Tub: Chiropodist: [place]: No  Discharge Living Setting Plans for Discharge Living Setting: Apartment,Lives with (comment) (Lives with his son and his brother) Type of Home at Discharge: Apartment Discharge Home Layout: One  level Discharge Home Access: Stairs to enter Entrance Stairs-Rails: Right,Left Entrance Stairs-Number of Steps: 27 stairs to 3rd floor apartment Discharge Bathroom Shower/Tub: Tub/shower unit,Curtain Discharge Bathroom Toilet:  Standard Discharge Bathroom Accessibility: Yes How Accessible: Accessible via walker Does the patient have any problems obtaining your medications?: No  Social/Family/Support Systems Patient Roles: Parent,Other (Comment) (Has a son and a brother) Anticipated Caregiver: brother and son Ability/Limitations of Caregiver: Son works varying shifts.  Brother is at home all the time and not working. Caregiver Availability: 24/7 Discharge Plan Discussed with Primary Caregiver: Yes Is Caregiver In Agreement with Plan?: Yes  Goals Patient/Family Goal for Rehab: PT/OT mod I and supervision goals Expected length of stay: 7-10 days Cultural Considerations: None Pt/Family Agrees to Admission and willing to participate: Yes Program Orientation Provided & Reviewed with Pt/Caregiver Including Roles  & Responsibilities: Yes  Decrease burden of Care through IP rehab admission:N/A  Possible need for SNF placement upon discharge: Not anticipated  Patient Condition: I have reviewed medical records from [facility], spoken with CM, and patient. I met with patient at the bedside for inpatient rehabilitation assessment.  Patient will benefit from ongoing PT and OT, can actively participate in 3 hours of therapy a day 5 days of the week, and can make measurable gains during the admission.  Patient will also benefit from the coordinated team approach during an Inpatient Acute Rehabilitation admission.  The patient will receive intensive therapy as well as Rehabilitation physician, nursing, social worker, and care management interventions.  Due to bladder management, bowel management, safety, skin/wound care, disease management, medication administration, pain management and patient education  the patient requires 24 hour a day rehabilitation nursing.  The patient is currently Minimum assistance with mobility and basic ADLs.  Discharge setting and therapy post discharge at home with home health is anticipated.  Patient has agreed to participate in the Acute Inpatient Rehabilitation Program and will admit today.  Preadmission Screen Completed By:  Michel Santee, PT, DPT 11/14/2020 11:19 AM ______________________________________________________________________   Discussed status with Dr. Naaman Plummer on 11/14/20  at 11:19 AM  and received approval for admission today.  Admission Coordinator:  Michel Santee, PT, DPT time 11:19 AM Sudie Grumbling 11/14/20    Assessment/Plan: Diagnosis: debility after LVAD placement 1. Does the need for close, 24 hr/day Medical supervision in concert with the patient's rehab needs make it unreasonable for this patient to be served in a less intensive setting? Yes 2. Co-Morbidities requiring supervision/potential complications: DM, CKDIII, csCHF, sacral wound 3. Due to bladder management, bowel management, safety, skin/wound care, disease management, medication administration, pain management and patient education, does the patient require 24 hr/day rehab nursing? Yes 4. Does the patient require coordinated care of a physician, rehab nurse, PT and OT to address physical and functional deficits in the context of the above medical diagnosis(es)? Yes Addressing deficits in the following areas: balance, endurance, locomotion, strength, transferring, bowel/bladder control, bathing, dressing, feeding, grooming, toileting and psychosocial support 5. Can the patient actively participate in an intensive therapy program of at least 3 hrs of therapy 5 days a week? Yes 6. The potential for patient to make measurable gains while on inpatient rehab is excellent 7. Anticipated functional outcomes upon discharge from inpatient rehab: modified independent and supervision PT, modified  independent and supervision OT, n/a SLP 8. Estimated rehab length of stay to reach the above functional goals is: 7-10 days 9. Anticipated discharge destination: Home 10. Overall Rehab/Functional Prognosis: excellent   MD Signature: Meredith Staggers, MD, [facility] 11/14/2020

## 2020-11-11 NOTE — Progress Notes (Signed)
ANTICOAGULATION CONSULT NOTE - Follow Up Consult  Pharmacy Consult for Warfarin Indication: LVAD  Allergies  Allergen Reactions  . Meclizine Hcl Anaphylaxis and Swelling  . Ivabradine Nausea Only    Patient Measurements: Height: 5' 10.5" (179.1 cm) Weight: 83.3 kg (183 lb 10.3 oz) IBW/kg (Calculated) : 74.15 Heparin Dosing Weight: 89.2 kg  Vital Signs: Temp: 99 F (37.2 C) (04/08 0730) Temp Source: Oral (04/08 0730) BP: 103/91 (04/08 0730) Pulse Rate: 83 (04/08 0730)  Labs: Recent Labs    11/09/20 0329 11/09/20 0330 11/10/20 0150 11/11/20 0342 11/11/20 0415  HGB 8.8*  --  8.7* 8.8*  --   HCT 28.8*  --  28.6* 29.3*  --   PLT 246  --  251 268  --   LABPROT 30.7*  --  29.2*  --  21.9*  INR 3.1*  --  2.9*  --  2.0*  CREATININE  --  4.85* 6.09*  --  4.78*    Estimated Creatinine Clearance: 17.5 mL/min (A) (by C-G formula based on SCr of 4.78 mg/dL (H)).   Medical History: Past Medical History:  Diagnosis Date  . AICD (automatic cardioverter/defibrillator) present    Single-chamber  implantable cardiac defibrillator - [employer]onic  . Atrial fibrillation ([place])   . Cataract    Mixed form OU  . CHF (congestive heart failure) ([place])   . Chronic kidney disease   . Chronic kidney disease (CKD), stage III (moderate) (HCC)   . Chronic systolic heart failure ([place])    a. Echo 5/13: Mild LVE, mild LVH, EF 10%, anteroseptal, lateral, apical AK, mild MR, mild LAE, moderately reduced RVSF, mild RAE, PASP 60;  b. 07/2014 Echo: EF 20-2%, diff HK, AKI of antsep/apical/mid-apicalinferior, mod reduced RV.  Marland Kitchen Coronary artery disease    a. s/p CABG 2002 b. LHC 5/13:  dLM 80%, LAD subtotally occluded, pCFX occluded, pRCA 50%, mid? Occlusion with high take off of the PDA with 70% multiple lesions-not bypassed and supplies collaterals to LAD, LIMA-IM/ramus ok, S-OM ok, S-PLA branches ok. Medical therapy was recommended  . COVID   . Diabetic retinopathy (HCC)    NPDR OD, PDR OS  . Dyspnea    . Gout    "on daily RX" (01/08/2018)  . Hypertension   . Hypertensive retinopathy    OU  . Ischemic cardiomyopathy    a. Echo 5/13: Mild LVE, mild LVH, EF 10%, anteroseptal, lateral, apical AK, mild MR, mild LAE, moderately reduced RVSF, mild RAE, PASP 60;  b. 01/2012 s/p MDT [id number] Protecta XT VR AICD;  c. 07/2014 Echo: EF 20-2%, diff HK, AKI of antsep/apical/mid-apicalinferior, mod reduced RV.  Marland Kitchen MRSA (methicillin resistant Staphylococcus aureus)    Status post right foot plantar deep infection with MRSA status post  I&D 10/2008  . Myocardial infarction [facility])    "was told I'd had several before heart OR in 2002" (01/08/2018)  . Noncompliance   . Peripheral neuropathy   . Retinopathy, diabetic, background ([place])   . Syncope   . Type II diabetes mellitus ([place])    requiring insulin   . Vitreous hemorrhage, left (HCC)    and proliferative diabetic retinopathy    Assessment: 60 yo M s/p new LVAD implant with HeartMate III on 3/10. Warfarin started per MD on 3/12, and pharmacy asked to take over warfarin dosing.   INR 2.9>2 after low doses of warfarin last few days - very sensitive. He is drinking Ensure daily and eating well - per patient report. CBC stable with Hgb  8s, pltc 300. LDH stable 200s  Goal of Therapy:  INR 2-2.5 Monitor platelets by anticoagulation protocol: Yes   Plan:  Warfarin 1mg  daily  Daily PT/INR, CBC, monitor s/sx bleeding   Bonnita Nasuti Pharm.D. CPP, BCPS Clinical Pharmacist [phone] 11/11/2020 7:54 AM     [facility] pharmacy phone numbers are listed on [email or web address]

## 2020-11-11 NOTE — Progress Notes (Signed)
Renal Navigator discussed outpatient HD referral for AKI treatment of LVAD patient to [facility] with clinic Medical Director Dr. Joelyn Oms, who feels patient is not yet medically stable enough for outpatient HD staff to patient ratio. He would like to see continued work with therapy as well as patient's ability to tolerate HD in recliner. Navigator asked Dr. Hollie Salk to order HD in recliner to evaluate this. Patient may require admission to inpatient rehab, with hopes of significant improvement of his debility, prior to MD feeling that patient is safe enough for outpatient HD if CIR agreeable. Renal Navigator will continue to monitor and discuss with Dr. Joelyn Oms.    Alphonzo Cruise, [place] Renal Navigator [phone]

## 2020-11-11 NOTE — Progress Notes (Signed)
[facility] Progress Note    Assessment/ Plan:   Acute kidney injury on chronic kidney disease stage III: With underlying chronic kidney disease likely from diabetes/hypertension and worsening renal function from hemodynamically mediated ATN and cardiorenal syndrome. CRRT 3/15-3/22, 3/24-27; HD since 3/28. -Unfortunately, no signs of renal recovery at the moment - s/p [facility] 11/07/20 with IR, appreciate assistance - HD TTS schedule now--> next HD 11/12/20 - Outpt CLIP underway.    Acute exacerbation of chronic systolic heart failure with cardiogenic shock: LVAD, placed 3/10. Volume status/UF managed with RRT.   Per above UF to euvolemia.  midodrine and warfarin, off Revatio d/t orthostasis  Anemia:Likely secondary to chronic disease and exacerbated by recent surgical losses.  Transfuse PRBC as needed.  Iron deficient, has been repleted IV.  ESA seems to have fallen off MAR- reinstated for 100 mcg q Tuesday  Atrial fibrillation: per cardiology, on amiodarone  Protein calorie malnutrition, hypoalbuminemia: push protein, per primary  Pressure ulcer: getting hydrotherapy  DM II: per primary  Vertigo: scopolamine patch, Epley maneuvers with PT  Subjective:    Seen in room.  Had HD yesterday with 1.4 L off.  Tolerated well.  Still having some vertigo- PT working with him doing Epley maneuvers.   Objective:   BP (!) 103/91 (BP Location: Left Arm)   Pulse 83   Temp 99 F (37.2 C) (Oral)   Resp 18   Ht 5' 10.5" (1.791 m)   Wt 83.3 kg   SpO2 92%   BMI 25.98 kg/m   Intake/Output Summary (Last 24 hours) at 11/11/2020 0947 Last data filed at 11/11/2020 7341 Gross per 24 hour  Intake 550 ml  Output 1600 ml  Net -1050 ml   Weight change: 0.2 kg  Physical Exam: Gen: NAD, lying in bed awake and alert CVS: mechanical hum throughout  Resp: clear  Abd: soft, + drive line Ext: no LE edema, R foot with wound which is dressed ACCESS: R TDC c/d/i  Imaging: CT HEAD WO  CONTRAST  Result Date: 11/09/2020 CLINICAL DATA:  Dizziness. EXAM: CT HEAD WITHOUT CONTRAST TECHNIQUE: Contiguous axial images were obtained from the base of the skull through the vertex without intravenous contrast. COMPARISON:  01/03/2012 FINDINGS: The study is mildly motion degraded. Brain: There is no evidence of an acute infarct, intracranial hemorrhage, mass, midline shift, or extra-axial fluid collection. The ventricles and sulci are within normal limits for age. Patchy hypodensities in the cerebral white matter bilaterally are new and nonspecific but compatible with mild chronic small vessel ischemic disease. Vascular: Calcified atherosclerosis at the skull base. No hyperdense vessel. Skull: No fracture or suspicious osseous lesion. Sinuses/Orbits: Small left maxillary sinus mucous retention cyst. Clear mastoid air cells. Unremarkable orbits. Other: None. IMPRESSION: 1. No evidence of acute intracranial abnormality. 2. Mild chronic small vessel ischemic disease. Electronically Signed   By: Logan Bores M.D.   On: 11/09/2020 17:12   DG CHEST PORT 1 VIEW  Result Date: 11/09/2020 CLINICAL DATA:  Cough. EXAM: PORTABLE CHEST 1 VIEW COMPARISON:  Chest x-ray 08/29/2020. FINDINGS: Dual-lumen catheter noted with tip over right atrium. Right PICC line noted with tip at cavoatrial junction. Cardiac pacer and left ventricular assist device in stable position. Prior CABG. Stable cardiomegaly. Pulmonary venous congestion. Mild bilateral interstitial prominence. A very mild component of CHF cannot be excluded. Left base atelectasis and or infiltrate. Small left pleural effusion. No pneumothorax. IMPRESSION: 1. Dual-lumen catheter with tip over right atrium. Right PICC line noted with tip at  cavoatrial junction. Cardiac pacer and left ventricular assist device in stable position. 2. Prior CABG. Stable cardiomegaly. Pulmonary venous congestion. Mild bilateral interstitial prominence. A very mild component of CHF cannot  be excluded. 3. Left base atelectasis and or infiltrate. Small left pleural effusion. Electronically Signed   By: Marcello Moores  Register   On: 11/09/2020 13:46    Labs: BMET Recent Labs  Lab 11/05/20 0500 11/06/20 0500 11/07/20 0923 11/08/20 0420 11/09/20 0330 11/10/20 0150 11/11/20 0415  NA 133* 136 136 136 134* 134* 134*  K 4.9 4.6 4.8 5.5* 4.9 5.2* 4.9  CL 95* 97* 96* 97* 96* 96* 97*  CO2 25 28 28 26 27 26 28   GLUCOSE 70 107* 79 110* 73 174* 110*  BUN 69* 41* 57* 70* 40* 57* 39*  CREATININE 5.75* 4.37* 5.85* 6.75* 4.85* 6.09* 4.78*  CALCIUM 9.2 9.1 9.3 9.1 9.2 8.9 9.1  PHOS 6.6* 5.2* 6.9* 7.5* 5.4* 6.4* 4.7*   CBC Recent Labs  Lab 11/08/20 0420 11/09/20 0329 11/10/20 0150 11/11/20 0342  WBC 8.2 8.3 9.7 10.2  HGB 8.7* 8.8* 8.7* 8.8*  HCT 28.1* 28.8* 28.6* 29.3*  MCV 90.1 89.4 89.7 91.6  PLT 266 246 251 268    Medications:    . (feeding supplement) PROSource Plus  30 mL Oral BID BM  . amiodarone  200 mg Oral Daily  . aspirin EC  81 mg Oral Daily  . Chlorhexidine Gluconate Cloth  6 each Topical Daily  . Chlorhexidine Gluconate Cloth  6 each Topical Q0600  . collagenase   Topical Daily  . [START ON 11/15/2020] darbepoetin (ARANESP) injection - DIALYSIS  100 mcg Intravenous Q Tue-HD  . feeding supplement (NEPRO CARB STEADY)  237 mL Oral BID BM  . gabapentin  100 mg Oral QHS  . insulin aspart  0-15 Units Subcutaneous TID WC  . insulin aspart  0-5 Units Subcutaneous QHS  . insulin glargine  15 Units Subcutaneous Daily  . metoCLOPramide  5 mg Oral TID AC  . midodrine  10 mg Oral 3 times per day on Sun Mon Wed Fri  . [START ON 11/12/2020] midodrine  15 mg Oral 3 times per day on Tue Thu Sat  . multivitamin  1 tablet Oral QHS  . pantoprazole  40 mg Oral Daily  . polyethylene glycol  17 g Oral BID  . rosuvastatin  20 mg Oral Daily  . senna-docusate  2 tablet Oral QHS  . sodium chloride flush  10-40 mL Intracatheter Q12H  . warfarin  1 mg Oral q1600  . Warfarin -  Pharmacist Dosing Inpatient   Does not apply q1600      Madelon Lips, MD 11/11/2020, 9:47 AM

## 2020-11-11 NOTE — Progress Notes (Signed)
IP rehab admissions - I met with patient at the bedside.  He would like CIR prior to home.  I gave him rehab booklets and explained inpatient rehab.  I have opened his case and have faxed clinicals requesting inpatient rehab.  I will follow up after we hear back from insurance case manager.  Call me for questions.  #[phone] 

## 2020-11-11 NOTE — Progress Notes (Addendum)
Patient ID: Marcus Johnson., male   DOB: 09-25-1960, 60 y.o.   MRN: [id number]     Advanced Heart Failure Rounding Note  PCP-Cardiologist: No primary care provider on file.   Subjective:    - 2/27 Milrinone switched to dobutamine 5 mcg.  - 2/28 Moved to ICU. Dobutamine increased to 7.5 mcg and Norepi 3 mcg added. Diuretics held.  - 3/1 Impella 5.5 placed.  - 3/4 Teeth removed - 3/10 HM3 LVAD placed - 3/13 VAD speed increased to 5500. [name] out  - 3/14 Nephrology consulted. Given 1UPRBCs  - 3/15 Started CRRT. Given 1UPRBCs.  - 3/23 VAD Speed turned down to 5300  - 3/23 CRRT and Milrinone Stopped. Back in Afib - 3/24 CRRT restarted.  - 3/27 CVVH off.  - 3/28 iHD - 3/29 iHD. Walked 75 feet!! - 3/20 Ramp Echo: Fixed speed: 5200 Low speed limit: 4900 - 4/3 S/P RIJ HD catheter.    CXR 4/6  Left base atelectasis and or infiltrate. Small left pleural effusion.   Had HD 4/7. Made 200 cc urine.   Feeling better. Had brief episode of dizziness.   LVAD Interrogation HM 3: Speed: 5200 Flow: 4.6  PI: 3.2   Power: 4. >36 PI events.   VAD interrogated personally. Parameters stable.  LDH - 237INR 2.0   Objective:   Weight Range: 83.3 kg Body mass index is 25.98 kg/m.   Vital Signs:   Temp:  [97.9 F (36.6 C)-98.4 F (36.9 C)] 98.3 F (36.8 C) (04/08 0339) Pulse Rate:  [25-113] 80 (04/08 0339) Resp:  [13-21] 21 (04/08 0420) BP: (84-132)/(32-109) 105/85 (04/08 0339) SpO2:  [90 %-99 %] 93 % (04/08 0339) Weight:  [83.2 kg-84.4 kg] 83.3 kg (04/08 0358) Last BM Date: 11/08/20  Weight change: Filed Weights   11/10/20 1355 11/10/20 1715 11/11/20 0358  Weight: 84.4 kg 83.2 kg 83.3 kg    Intake/Output:   Intake/Output Summary (Last 24 hours) at 11/11/2020 0733 Last data filed at 11/11/2020 0419 Gross per 24 hour  Intake 360 ml  Output 1600 ml  Net -1240 ml      Physical Exam   Maps 90-100s  Physical Exam: GENERAL: No acute distress. HEENT: normal  NECK: Supple,  JVP 7-8  .  2+ bilaterally, no bruits.  No lymphadenopathy or thyromegaly appreciated.   CARDIAC:  Mechanical heart sounds with LVAD hum present.  LUNGS:  Clear to auscultation bilaterally.  ABDOMEN:  Soft, round, nontender, positive bowel sounds x4.     LVAD exit site:  Dressing dry and intact.  No erythema or drainage.  Stabilization device present and accurately applied.EXTREMITIES:  Warm and dry, no cyanosis, clubbing, rash or edema. RUE PICC   NEUROLOGIC:  Alert and oriented x 3.    No aphasia.  No dysarthria.  Affect pleasant.       Telemetry   NSR 70-80s with occasional PVCs.    Labs    CBC Recent Labs    11/10/20 0150 11/11/20 0342  WBC 9.7 10.2  HGB 8.7* 8.8*  HCT 28.6* 29.3*  MCV 89.7 91.6  PLT 251 357   Basic Metabolic Panel Recent Labs    11/10/20 0150 11/11/20 0415  NA 134* 134*  K 5.2* 4.9  CL 96* 97*  CO2 26 28  GLUCOSE 174* 110*  BUN 57* 39*  CREATININE 6.09* 4.78*  CALCIUM 8.9 9.1  MG 2.3 2.1  PHOS 6.4* 4.7*   Liver Function Tests Recent Labs    11/10/20 0150 11/11/20  0415  ALBUMIN 2.7* 2.7*   No results for input(s): LIPASE, AMYLASE in the last 72 hours. Cardiac Enzymes No results for input(s): CKTOTAL, CKMB, CKMBINDEX, TROPONINI in the last 72 hours.  BNP: BNP (last 3 results) Recent Labs    10/27/20 0020 11/02/20 2310 11/10/20 0150  BNP 774.9* 486.0* 905.1*    ProBNP (last 3 results) No results for input(s): PROBNP in the last 8760 hours.   D-Dimer No results for input(s): DDIMER in the last 72 hours. Hemoglobin A1C No results for input(s): HGBA1C in the last 72 hours. Fasting Lipid Panel No results for input(s): CHOL, HDL, LDLCALC, TRIG, CHOLHDL, LDLDIRECT in the last 72 hours. Thyroid Function Tests No results for input(s): TSH, T4TOTAL, T3FREE, THYROIDAB in the last 72 hours.  Invalid input(s): FREET3  Other results:   Imaging    No results found.   Medications:     Scheduled Medications: . (feeding  supplement) PROSource Plus  30 mL Oral BID BM  . amiodarone  200 mg Oral Daily  . aspirin EC  81 mg Oral Daily  . Chlorhexidine Gluconate Cloth  6 each Topical Daily  . Chlorhexidine Gluconate Cloth  6 each Topical Q0600  . collagenase   Topical Daily  . [START ON 11/15/2020] darbepoetin (ARANESP) injection - DIALYSIS  100 mcg Intravenous Q Tue-HD  . feeding supplement (NEPRO CARB STEADY)  237 mL Oral BID BM  . gabapentin  100 mg Oral QHS  . insulin aspart  0-15 Units Subcutaneous TID WC  . insulin aspart  0-5 Units Subcutaneous QHS  . insulin glargine  15 Units Subcutaneous Daily  . metoCLOPramide  5 mg Oral TID AC  . midodrine  15 mg Oral TID WC  . multivitamin  1 tablet Oral QHS  . pantoprazole  40 mg Oral Daily  . polyethylene glycol  17 g Oral BID  . rosuvastatin  20 mg Oral Daily  . senna-docusate  2 tablet Oral QHS  . sodium chloride flush  10-40 mL Intracatheter Q12H  . Warfarin - Pharmacist Dosing Inpatient   Does not apply q1600    Infusions: . sodium chloride    . sodium chloride    . ferric gluconate (FERRLECIT/NULECIT) IV Stopped (11/10/20 1826)  . lactated ringers Stopped (10/17/20 1655)    PRN Medications: sodium chloride, sodium chloride, acetaminophen, alteplase, dextrose, fentaNYL (SUBLIMAZE) injection, heparin, heparin, lidocaine (PF), lidocaine-prilocaine, ondansetron (ZOFRAN) IV, oxyCODONE, pentafluoroprop-tetrafluoroeth, senna-docusate, sodium chloride flush, traZODone, white petrolatum    Assessment/Plan   1.Acute on chronic systolic HF -> cardiogenic shock:ICM.HasMedtronic ICD. EF 15%. Low output persisted despite milrinone and then dobutamine with rise in creatinine to 2.5. Impella 5.5 placed 3/1.  Creatinine improved to 1.7 and HM3 LVAD was placed on 3/10. Developed post-op renal failure requiring CVVH. Now off milrinone and epinephrine. Last echo showed moderate RV enlargement with mildly decreased RV function and VAD speed decreased to 5300 due to  left-sided septum. CRRT stopped 3/23 but restarted 3/24. CVVH stopped again on 3/27. Ramp echo 3/30 with speed decreased to 5200. Tolerating iHD for volume management.  MAPs 90s  - Cut back midodrine 10 mg  tid on non HD days, continue 15 tid on HD days - Off sildenafil with orthostasis.  - Continue compression stockings.  - HD  volume management.   - c/w Reglan and zofran for nausea. Off scopolamine patch due to dizziness  2. LVAD: 3/13 VAD speed increased to 5500. 3/23 speed decreased to 5300 and decreased to 5200 on 3/30. VAD  interrogated personally. LDH 237, INR 2.0 - On warfarin, on ASA 81 daily. Discussed dosing with PharmD personally. - DL site ok 3. CAD s/p [id number]:Last cath in 6/19 with patent grafts.  - Continue Crestor 40 mg daily.  4. AKI on CKD Stage 3b: Suspect that this is a combination of cardiorenal syndrome and diabetic nephropathy and possibly ATN. Developed post-op AKI. Started on CRRT 3/15. CVVH stopped 3/23 but restarted 3/24.  - Tolerating iHD.  - S/P tunneled HD catheter.    5. Atrial flutter/fibrillation: s/p DC-CV 08/21/18. Maintaining NSR.     - Continue amio 200 mg daily.   6. Wound R Foot: Partial thickness skin loss noted. Cover with mepilex border and change every 5 days. Resolved.  7. Right arm pain: Likely neuropathic from stretch of brachial plexus with Impella placement.  8. ID:  WBCs normal. Afebrile. Blood Cx- NGTD.  9. ML:JQGBEEF of very poor control but hgbA1ctrending down. Most recent 7.7%.  Insulin drip stopped 3/24. Blood glucose controlled on reduced Lantus and  sliding scale insulin to moderate.   - Diabetes coordinators have seen 10. Anemia: Has been stable. hgb 8.8 11. Deconditioning - PT/OT following, continue aggressive PT/OT efforts - CIR consulted 12. Unstageable Pressure Ulcer on buttokcs.  - On hydrotherapy Mon-Sat.  - Continue reposition every 2 hours side lying position. Reposition frequently in the chair. - Cough: CXR 11/09/20 w/  Left base atelectasis and or infiltrate. Small left pleural effusion.  PCT 0.67. BNP 905 (up from 486)  - WBC 10.2 13. NSVT - K 4.9 - mag ok.   Renal Navigator following.  Continue PT/OT. CIR can open for admit.   Darrick Grinder, NP-C  11/11/2020 7:33 AM  Patient seen with NP, agree with the above note.   Gradually doing better, walking more.  Less nausea today with Reglan.  MAP stable.  Tolerated HD yesterday.   General: Well appearing this am. NAD.  HEENT: Normal. Neck: Supple, JVP 8 cm. Carotids OK.  Cardiac:  Mechanical heart sounds with LVAD hum present.  Lungs:  CTAB, normal effort.  Abdomen:  NT, ND, no HSM. No bruits or masses. +BS  LVAD exit site: Well-healed and incorporated. Dressing dry and intact. No erythema or drainage. Stabilization device present and accurately applied. Driveline dressing changed daily per sterile technique. Extremities:  Warm and dry. No cyanosis, clubbing, rash, or edema.  Neuro:  Alert & oriented x 3. Cranial nerves grossly intact. Moves all 4 extremities w/o difficulty. Affect pleasant    Continue TTS HD, no renal recovery at this point.   Can decrease midodrine to 10 mg tid on non-HD days, gradually cut down with good MAP.  Continue ambulation with PT, continue to work with left hand.  Hopefully to CIR early next week.   Loralie Champagne 11/11/2020 8:02 AM

## 2020-11-11 NOTE — Progress Notes (Signed)
LVAD Coordinator Rounding Note:  Admitted 09/28/20 due to worsening heart failure symptoms. Recent COVID infection January 2022; did not take medications for 3-4 weeks.   S/p dental extractions 10/07/20 per Dr Benson Norway.  HM III LVAD implanted on 10/13/20 by Dr Cyndia Bent under Destination Therapy criteria.  Pt lying in bed. Pt just finished walking with PT. Pt states that he was able to walk all the way to the other side of the unit. Pt states that he feels like he is getting stronger. Pt states that he still cannot put himself on batteries independently but feels that he his right hand is doing better. Pt states that he had a brief episode of dizziness early this morning.  Pt may be able to go d/c to CIR early next week. D/c teaching with family schedule on Monday at 0900.  Vital signs: Temp: 97.8 HR: 89 Doppler: 86 Auto cuff: 138/84 (92) O2 Sat: 94% on RA Wt: 200.1>221.7>219.5.Marland KitchenMarland Kitchen>185.8>181>179.9>190.2>185.4>187.6>185.6>183.6 lbs    LVAD interrogation reveals:  Speed: 5200 Flow: 4.2 Power: 3.8w PI: 4.3 Hematocrit: 29  Alarms: none Events: 30 PI today; 60+ yesterday  Fixed speed: 5200 Low speed limit: 4900  Drive Line: Existing VAD dressing removed and site care performed using sterile technique. Drive line exit site cleaned with Chlora prep applicators x 2, allowed to dry, Cellurate applied to driveline site and gauze dressing with silver strip re-applied. Exit site healing and unincorporated, the velour is fully implanted at exit site. Small amount of serosanguinous drainage. No redness, tenderness, foul odor or rash noted. Drive line anchor re-applied. Continue Monday, Wednesday, Friday dressing changes by VAD coordinator or nurse champion. Next dressing change due 11/14/20.     Labs:  LDH trend: 444>670>575>571.....>246>244>245>220>209>198>226>209>237  INR trend: 1.4>1.7>1.8>1.7>1.6>1.8............1.8>1.9>1.7>2.4>2.8>2.6>3.0>3.1>2.9>2.0  Creatinine:  1.84>3.0>3.4>2.73.....2.68>3.11>6.37>4.32>5.8>6.75>4.8>6.09>4.78  Anticoagulation Plan: -INR Goal: 2.0-2.5 -ASA Dose: 81 mg  Blood Products:  IntraOp:   1 Platelet  6 FFP  3 PRBC  1035 cell saver Postop:   10/13/20:   4 FFP  4 PRBC    10/14/20:  1 FFP  1 PRBC   10/16/20:  1 PRBC   10/18/20:  1 PRBC   10/16/20:  1 PRBC   10/18/20:  1 PRBC   10/27/20:  1 PRBC  Device: notified Ronalee Belts from [employer] to turn pts ICD back on 11/09/20 -  [employer] single -  Pacing: VVI 40 - Therapies: on 200 bpm  Arrythmias: VT in OR requiring defibrillation   Renal: CRRT   - started 10/19/20   - stopped 10/26/20   - re-started 10/27/20  - stopped 10/30/20   iHD started 10/31/20   Infection: - 10/27/20 blood cultures - no growth x 5 days; final   Patient Education: 1. Ongoing.  Plan/Recommendations:  1. Call VAD coordinator for equipment or drive line issues 2. MWF drive line dressing change per VAD coordinator or nurse champion. Next dressing change due 11/14/20. 3. D/C education scheduled at 0900 on 11/14/20 for pts family.  Tanda Rockers RN Cache Coordinator  Office: [phone]  24/7 Pager: [phone]

## 2020-11-12 DIAGNOSIS — I5023 Acute on chronic systolic (congestive) heart failure: Secondary | ICD-10-CM | POA: Diagnosis not present

## 2020-11-12 DIAGNOSIS — Z95811 Presence of heart assist device: Secondary | ICD-10-CM | POA: Diagnosis not present

## 2020-11-12 LAB — COOXEMETRY PANEL
Carboxyhemoglobin: 1.9 % — ABNORMAL HIGH (ref 0.5–1.5)
Methemoglobin: 0.8 % (ref 0.0–1.5)
O2 Saturation: 47.9 %
Total hemoglobin: 8.8 g/dL — ABNORMAL LOW (ref 12.0–16.0)

## 2020-11-12 LAB — RENAL FUNCTION PANEL
Albumin: 2.7 g/dL — ABNORMAL LOW (ref 3.5–5.0)
Anion gap: 11 (ref 5–15)
BUN: 58 mg/dL — ABNORMAL HIGH (ref 6–20)
CO2: 29 mmol/L (ref 22–32)
Calcium: 9.3 mg/dL (ref 8.9–10.3)
Chloride: 96 mmol/L — ABNORMAL LOW (ref 98–111)
Creatinine, Ser: 6.43 mg/dL — ABNORMAL HIGH (ref 0.61–1.24)
GFR, Estimated: 9 mL/min — ABNORMAL LOW (ref >60)
Glucose, Bld: 135 mg/dL — ABNORMAL HIGH (ref 70–99)
Phosphorus: 5.4 mg/dL — ABNORMAL HIGH (ref 2.5–4.6)
Potassium: 5 mmol/L (ref 3.5–5.1)
Sodium: 136 mmol/L (ref 135–145)

## 2020-11-12 LAB — MAGNESIUM: Magnesium: 2.2 mg/dL (ref 1.7–2.4)

## 2020-11-12 LAB — GLUCOSE, CAPILLARY
Glucose-Capillary: 132 mg/dL — ABNORMAL HIGH (ref 70–99)
Glucose-Capillary: 143 mg/dL — ABNORMAL HIGH (ref 70–99)
Glucose-Capillary: 150 mg/dL — ABNORMAL HIGH (ref 70–99)

## 2020-11-12 LAB — PROTIME-INR
INR: 1.7 — ABNORMAL HIGH (ref 0.8–1.2)
Prothrombin Time: 19.2 seconds — ABNORMAL HIGH (ref 11.4–15.2)

## 2020-11-12 LAB — LACTATE DEHYDROGENASE: LDH: 209 U/L — ABNORMAL HIGH (ref 98–192)

## 2020-11-12 MED ORDER — MIDODRINE HCL 5 MG PO TABS
5.0000 mg | ORAL_TABLET | ORAL | Status: DC
  Administered 2020-11-13 – 2020-11-14 (×4): 5 mg via ORAL
  Filled 2020-11-12 (×3): qty 1

## 2020-11-12 MED ORDER — HEPARIN SODIUM (PORCINE) 1000 UNIT/ML IJ SOLN
INTRAMUSCULAR | Status: AC
  Filled 2020-11-12: qty 4

## 2020-11-12 MED ORDER — MIDODRINE HCL 5 MG PO TABS
10.0000 mg | ORAL_TABLET | ORAL | Status: DC
  Administered 2020-11-12 (×2): 10 mg via ORAL
  Filled 2020-11-12 (×3): qty 2

## 2020-11-12 MED ORDER — WARFARIN SODIUM 1 MG PO TABS
1.5000 mg | ORAL_TABLET | Freq: Once | ORAL | Status: AC
  Administered 2020-11-12: 1.5 mg via ORAL
  Filled 2020-11-12: qty 1

## 2020-11-12 NOTE — Progress Notes (Signed)
[facility] Progress Note    Assessment/ Plan:   Acute kidney injury on chronic kidney disease stage III: With underlying chronic kidney disease likely from diabetes/hypertension and worsening renal function from hemodynamically mediated ATN and cardiorenal syndrome. CRRT 3/15-3/22, 3/24-27; HD since 3/28. -Unfortunately, no signs of renal recovery at the moment - s/p [facility] 11/07/20 with IR, appreciate assistance - HD TTS schedule now--> next HD 11/12/20 - Outpt CLIP underway--> renal navigator following for improvement.    Acute exacerbation of chronic systolic heart failure with cardiogenic shock: LVAD, placed 3/10. Volume status/UF managed with RRT.   Per above UF to euvolemia.  midodrine and warfarin, off Revatio d/t orthostasis  Anemia:Likely secondary to chronic disease and exacerbated by recent surgical losses.  Transfuse PRBC as needed.  Iron deficient, has been repleted IV.  ESA seems to have fallen off MAR- reinstated for 100 mcg q Tuesday  Atrial fibrillation: per cardiology, on amiodarone  Protein calorie malnutrition, hypoalbuminemia: push protein, per primary  Pressure ulcer: getting hydrotherapy  DM II: per primary  Vertigo: scopolamine patch, Epley maneuvers with PT  Dispo: hopeful for CIR.  Pt will need to be able to sit in a chair for OP dialysis  Subjective:    Seen in room.  Still having vertigo.  Better when lays on the R.     Objective:   BP 94/79   Pulse 83   Temp 97.9 F (36.6 C) (Oral)   Resp 20   Ht 5' 10.5" (1.791 m)   Wt 83.4 kg   SpO2 93%   BMI 26.01 kg/m   Intake/Output Summary (Last 24 hours) at 11/12/2020 1032 Last data filed at 11/11/2020 1600 Gross per 24 hour  Intake 477 ml  Output --  Net 477 ml   Weight change: -1 kg  Physical Exam: Gen: NAD, lying in bed awake and alert CVS: mechanical hum throughout  Resp: clear  Abd: soft, + drive line Ext: no LE edema, R foot with wound which is dressed ACCESS: R TDC  c/d/i  Imaging: No results found.  Labs: BMET Recent Labs  Lab 11/06/20 0500 11/07/20 0923 11/08/20 0420 11/09/20 0330 11/10/20 0150 11/11/20 0415 11/12/20 0500  NA 136 136 136 134* 134* 134* 136  K 4.6 4.8 5.5* 4.9 5.2* 4.9 5.0  CL 97* 96* 97* 96* 96* 97* 96*  CO2 28 28 26 27 26 28 29   GLUCOSE 107* 79 110* 73 174* 110* 135*  BUN 41* 57* 70* 40* 57* 39* 58*  CREATININE 4.37* 5.85* 6.75* 4.85* 6.09* 4.78* 6.43*  CALCIUM 9.1 9.3 9.1 9.2 8.9 9.1 9.3  PHOS 5.2* 6.9* 7.5* 5.4* 6.4* 4.7* 5.4*   CBC Recent Labs  Lab 11/08/20 0420 11/09/20 0329 11/10/20 0150 11/11/20 0342  WBC 8.2 8.3 9.7 10.2  HGB 8.7* 8.8* 8.7* 8.8*  HCT 28.1* 28.8* 28.6* 29.3*  MCV 90.1 89.4 89.7 91.6  PLT 266 246 251 268    Medications:    . (feeding supplement) PROSource Plus  30 mL Oral BID BM  . amiodarone  200 mg Oral Daily  . aspirin EC  81 mg Oral Daily  . Chlorhexidine Gluconate Cloth  6 each Topical Daily  . Chlorhexidine Gluconate Cloth  6 each Topical Q0600  . collagenase   Topical Daily  . [START ON 11/15/2020] darbepoetin (ARANESP) injection - DIALYSIS  100 mcg Intravenous Q Tue-HD  . feeding supplement (NEPRO CARB STEADY)  237 mL Oral BID BM  . gabapentin  100 mg Oral  QHS  . insulin aspart  0-15 Units Subcutaneous TID WC  . insulin aspart  0-5 Units Subcutaneous QHS  . insulin glargine  15 Units Subcutaneous Daily  . metoCLOPramide  5 mg Oral TID AC  . midodrine  10 mg Oral 3 times per day on Tue Thu Sat  . [START ON 11/13/2020] midodrine  5 mg Oral 3 times per day on Sun Mon Wed Fri  . multivitamin  1 tablet Oral QHS  . pantoprazole  40 mg Oral Daily  . polyethylene glycol  17 g Oral BID  . rosuvastatin  20 mg Oral Daily  . senna-docusate  2 tablet Oral QHS  . sodium chloride flush  10-40 mL Intracatheter Q12H  . warfarin  1 mg Oral q1600  . Warfarin - Pharmacist Dosing Inpatient   Does not apply q1600      Madelon Lips, MD 11/12/2020, 10:32 AM

## 2020-11-12 NOTE — Progress Notes (Signed)
Patient ID: Marcus Callicott., male   DOB: [birth year], 60 y.o.   MRN: [id number]     Advanced Heart Failure Rounding Note  PCP-Cardiologist: No primary care provider on file.   Subjective:    - 2/27 Milrinone switched to dobutamine 5 mcg.  - 2/28 Moved to ICU. Dobutamine increased to 7.5 mcg and Norepi 3 mcg added. Diuretics held.  - 3/1 Impella 5.5 placed.  - 3/4 Teeth removed - 3/10 HM3 LVAD placed - 3/13 VAD speed increased to 5500. [name] out  - 3/14 Nephrology consulted. Given 1UPRBCs  - 3/15 Started CRRT. Given 1UPRBCs.  - 3/23 VAD Speed turned down to 5300  - 3/23 CRRT and Milrinone Stopped. Back in Afib - 3/24 CRRT restarted.  - 3/27 CVVH off.  - 3/28 iHD - 3/29 iHD. Walked 75 feet!! - 3/20 Ramp Echo: Fixed speed: 5200 Low speed limit: 4900 - 4/3 S/P RIJ HD catheter.   Still gets BPPV-like symptoms with nausea when he moves his head rapidly to left.  Had Epley maneuvers from PT.  Walking more every day, 140 feet yesterday.  MAP 90s.    LVAD Interrogation HM 3: Speed: 5200 Flow: 4.2  PI: 4.6  Power: 3.7. 2 PI events.   VAD interrogated personally. Parameters stable.  LDH 202 INR 1.7  Objective:   Weight Range: 83.4 kg Body mass index is 26.01 kg/m.   Vital Signs:   Temp:  [97.8 F (36.6 C)-98.6 F (37 C)] 97.9 F (36.6 C) (04/09 0736) Pulse Rate:  [83-90] 83 (04/08 2300) Resp:  [20] 20 (04/09 0422) BP: (94-138)/(79-90) 94/79 (04/09 0736) SpO2:  [93 %-94 %] 93 % (04/08 1900) Weight:  [83.4 kg] 83.4 kg (04/09 0500) Last BM Date: 11/11/20  Weight change: Filed Weights   11/10/20 1715 11/11/20 0358 11/12/20 0500  Weight: 83.2 kg 83.3 kg 83.4 kg    Intake/Output:   Intake/Output Summary (Last 24 hours) at 11/12/2020 0933 Last data filed at 11/11/2020 1600 Gross per 24 hour  Intake 477 ml  Output --  Net 477 ml      Physical Exam   MAP 90s General: Well appearing this am. NAD.  HEENT: Normal. Neck: Supple, JVP 7-8 cm. Carotids OK.  Cardiac:   Mechanical heart sounds with LVAD hum present.  Lungs:  CTAB, normal effort.  Abdomen:  NT, ND, no HSM. No bruits or masses. +BS  LVAD exit site: Well-healed and incorporated. Dressing dry and intact. No erythema or drainage. Stabilization device present and accurately applied. Driveline dressing changed daily per sterile technique. Extremities:  Warm and dry. No cyanosis, clubbing, rash, or edema.  Neuro:  Alert & oriented x 3. Cranial nerves grossly intact. Moves all 4 extremities w/o difficulty. Affect pleasant     Telemetry   NSR 70-80s with occasional PVCs (personally reviewed)   Labs    CBC Recent Labs    11/10/20 0150 11/11/20 0342  WBC 9.7 10.2  HGB 8.7* 8.8*  HCT 28.6* 29.3*  MCV 89.7 91.6  PLT 251 086   Basic Metabolic Panel Recent Labs    11/11/20 0415 11/12/20 0500  NA 134* 136  K 4.9 5.0  CL 97* 96*  CO2 28 29  GLUCOSE 110* 135*  BUN 39* 58*  CREATININE 4.78* 6.43*  CALCIUM 9.1 9.3  MG 2.1 2.2  PHOS 4.7* 5.4*   Liver Function Tests Recent Labs    11/11/20 0415 11/12/20 0500  ALBUMIN 2.7* 2.7*   No results for input(s): LIPASE, AMYLASE  in the last 72 hours. Cardiac Enzymes No results for input(s): CKTOTAL, CKMB, CKMBINDEX, TROPONINI in the last 72 hours.  BNP: BNP (last 3 results) Recent Labs    10/27/20 0020 11/02/20 2310 11/10/20 0150  BNP 774.9* 486.0* 905.1*    ProBNP (last 3 results) No results for input(s): PROBNP in the last 8760 hours.   D-Dimer No results for input(s): DDIMER in the last 72 hours. Hemoglobin A1C No results for input(s): HGBA1C in the last 72 hours. Fasting Lipid Panel No results for input(s): CHOL, HDL, LDLCALC, TRIG, CHOLHDL, LDLDIRECT in the last 72 hours. Thyroid Function Tests No results for input(s): TSH, T4TOTAL, T3FREE, THYROIDAB in the last 72 hours.  Invalid input(s): FREET3  Other results:   Imaging    No results found.   Medications:     Scheduled Medications: . (feeding  supplement) PROSource Plus  30 mL Oral BID BM  . amiodarone  200 mg Oral Daily  . aspirin EC  81 mg Oral Daily  . Chlorhexidine Gluconate Cloth  6 each Topical Daily  . Chlorhexidine Gluconate Cloth  6 each Topical Q0600  . collagenase   Topical Daily  . [START ON 11/15/2020] darbepoetin (ARANESP) injection - DIALYSIS  100 mcg Intravenous Q Tue-HD  . feeding supplement (NEPRO CARB STEADY)  237 mL Oral BID BM  . gabapentin  100 mg Oral QHS  . insulin aspart  0-15 Units Subcutaneous TID WC  . insulin aspart  0-5 Units Subcutaneous QHS  . insulin glargine  15 Units Subcutaneous Daily  . metoCLOPramide  5 mg Oral TID AC  . midodrine  10 mg Oral 3 times per day on Tue Thu Sat  . [START ON 11/13/2020] midodrine  5 mg Oral 3 times per day on Sun Mon Wed Fri  . multivitamin  1 tablet Oral QHS  . pantoprazole  40 mg Oral Daily  . polyethylene glycol  17 g Oral BID  . rosuvastatin  20 mg Oral Daily  . senna-docusate  2 tablet Oral QHS  . sodium chloride flush  10-40 mL Intracatheter Q12H  . warfarin  1 mg Oral q1600  . Warfarin - Pharmacist Dosing Inpatient   Does not apply q1600    Infusions: . sodium chloride    . sodium chloride    . ferric gluconate (FERRLECIT/NULECIT) IV Stopped (11/10/20 1826)  . lactated ringers Stopped (10/17/20 1655)    PRN Medications: sodium chloride, sodium chloride, acetaminophen, alteplase, dextrose, fentaNYL (SUBLIMAZE) injection, heparin, heparin, lidocaine (PF), lidocaine-prilocaine, ondansetron (ZOFRAN) IV, oxyCODONE, pentafluoroprop-tetrafluoroeth, senna-docusate, sodium chloride flush, traZODone, white petrolatum    Assessment/Plan   1.Acute on chronic systolic HF -> cardiogenic shock:ICM.HasMedtronic ICD. EF 15%. Low output persisted despite milrinone and then dobutamine with rise in creatinine to 2.5. Impella 5.5 placed 3/1.  Creatinine improved to 1.7 and HM3 LVAD was placed on 3/10. Developed post-op renal failure requiring CVVH. Now off  milrinone and epinephrine. Last echo showed moderate RV enlargement with mildly decreased RV function and VAD speed decreased to 5300 due to left-sided septum. CRRT stopped 3/23 but restarted 3/24. CVVH stopped again on 3/27. Ramp echo 3/30 with speed decreased to 5200. Tolerating iHD for volume management.  MAPs 90s  - Cut back midodrine 5 mg tid on non HD days, continue 10 tid on HD days. Continue to gradually decrease.  - Off sildenafil with orthostasis.  - Continue compression stockings.  - HD  volume management.   - c/w Reglan and zofran for nausea. Off scopolamine patch  due to dizziness  2. LVAD: 3/13 VAD speed increased to 5500. 3/23 speed decreased to 5300 and decreased to 5200 on 3/30. VAD interrogated personally. LDH 202, INR 1.7.  - On warfarin, on ASA 81 daily. Discussed dosing with PharmD personally. - DL site ok 3. CAD s/p CABG2002:Last cath in 6/19 with patent grafts.  - Continue Crestor 40 mg daily.  4. AKI on CKD Stage 3b: Suspect that this is a combination of cardiorenal syndrome and diabetic nephropathy and possibly ATN. Developed post-op AKI. Started on CRRT 3/15. CVVH stopped 3/23 but restarted 3/24. Now getting iHD, looks like he will be HD-dependent.  Has tunneled catheter.  - To get HD today.   5. Atrial flutter/fibrillation: s/p DC-CV 08/21/18. Maintaining NSR.     - Continue amio 200 mg daily.   6. Wound R Foot: Partial thickness skin loss noted. Cover with mepilex border and change every 5 days. Resolved.  7. Right arm pain: Likely neuropathic from stretch of brachial plexus with Impella placement.  8. ID:  WBCs normal. Afebrile. Blood Cx- NGTD.  9. DS:KAJGOTL of very poor control but hgbA1ctrending down. Most recent 7.7%.  Insulin drip stopped 3/24. Blood glucose controlled on reduced Lantus and  sliding scale insulin to moderate.   - Diabetes coordinators have seen 10. Anemia: Has been stable.  11. Deconditioning - PT/OT following, continue aggressive PT/OT  efforts - CIR consulted, hopefully to CIR early next week.  12. Unstageable Pressure Ulcer on buttocks: Improving.  - On hydrotherapy Mon-Sat.  - Continue reposition every 2 hours side lying position. Reposition frequently in the chair. 13. NSVT: Rare.  Renal Navigator following.  Continue PT/OT. CIR hopefully early next week.   Loralie Champagne 11/12/2020 9:33 AM

## 2020-11-12 NOTE — Progress Notes (Signed)
ANTICOAGULATION CONSULT NOTE - Follow Up Consult  Pharmacy Consult for Warfarin Indication: LVAD  Allergies  Allergen Reactions  . Meclizine Hcl Anaphylaxis and Swelling  . Ivabradine Nausea Only    Patient Measurements: Height: 5' 10.5" (179.1 cm) Weight:  (bed scale not working) IBW/kg (Calculated) : 74.15 Heparin Dosing Weight: 89.2 kg  Vital Signs: Temp: 98.4 F (36.9 C) (04/09 1300) Temp Source: Oral (04/09 1300) BP: 121/71 (04/09 1400)  Labs: Recent Labs    11/10/20 0150 11/11/20 0342 11/11/20 0415 11/12/20 0500  HGB 8.7* 8.8*  --   --   HCT 28.6* 29.3*  --   --   PLT 251 268  --   --   LABPROT 29.2*  --  21.9* 19.2*  INR 2.9*  --  2.0* 1.7*  CREATININE 6.09*  --  4.78* 6.43*    Estimated Creatinine Clearance: 13 mL/min (A) (by C-G formula based on SCr of 6.43 mg/dL (H)).   Medical History: Past Medical History:  Diagnosis Date  . AICD (automatic cardioverter/defibrillator) present    Single-chamber  implantable cardiac defibrillator - Medtronic  . Atrial fibrillation ([place])   . Cataract    Mixed form OU  . CHF (congestive heart failure) ([place])   . Chronic kidney disease   . Chronic kidney disease (CKD), stage III (moderate) (HCC)   . Chronic systolic heart failure ([place])    a. Echo 5/13: Mild LVE, mild LVH, EF 10%, anteroseptal, lateral, apical AK, mild MR, mild LAE, moderately reduced RVSF, mild RAE, PASP 60;  b. 07/2014 Echo: EF 20-2%, diff HK, AKI of antsep/apical/mid-apicalinferior, mod reduced RV.  Marland Kitchen Coronary artery disease    a. s/p CABG 2002 b. LHC 5/13:  dLM 80%, LAD subtotally occluded, pCFX occluded, pRCA 50%, mid? Occlusion with high take off of the PDA with 70% multiple lesions-not bypassed and supplies collaterals to LAD, LIMA-IM/ramus ok, S-OM ok, S-PLA branches ok. Medical therapy was recommended  . COVID   . Diabetic retinopathy (HCC)    NPDR OD, PDR OS  . Dyspnea   . Gout    "on daily RX" (01/08/2018)  . Hypertension   . Hypertensive  retinopathy    OU  . Ischemic cardiomyopathy    a. Echo 5/13: Mild LVE, mild LVH, EF 10%, anteroseptal, lateral, apical AK, mild MR, mild LAE, moderately reduced RVSF, mild RAE, PASP 60;  b. 01/2012 s/p MDT [id number] Protecta XT VR AICD;  c. 07/2014 Echo: EF 20-2%, diff HK, AKI of antsep/apical/mid-apicalinferior, mod reduced RV.  Marland Kitchen MRSA (methicillin resistant Staphylococcus aureus)    Status post right foot plantar deep infection with MRSA status post  I&D 10/2008  . Myocardial infarction [facility])    "was told I'd had several before heart OR in 2002" (01/08/2018)  . Noncompliance   . Peripheral neuropathy   . Retinopathy, diabetic, background ([place])   . Syncope   . Type II diabetes mellitus ([place])    requiring insulin   . Vitreous hemorrhage, left (HCC)    and proliferative diabetic retinopathy    Assessment: 60 yo M s/p new LVAD implant with HeartMate III on 3/10. Warfarin started per MD on 3/12, and pharmacy asked to take over warfarin dosing.   INR 2.9>2>1.7 after low doses of warfarin last few days - very sensitive. He is drinking Ensure daily and eating well - per patient report. CBC stable with Hgb 8s, pltc 300. LDH stable 200s  Goal of Therapy:  INR 2-2.5 Monitor platelets by anticoagulation protocol: Yes  Plan:  Warfarin 1.5 mg x 1 tonight. Daily PT/INR, CBC, monitor s/sx bleeding  [place] Crane, Roylene Reason, [facility] Clinical Pharmacist  11/12/2020 2:32 PM   [facility] pharmacy phone numbers are listed on [email or web address]

## 2020-11-13 DIAGNOSIS — Z95811 Presence of heart assist device: Secondary | ICD-10-CM | POA: Diagnosis not present

## 2020-11-13 DIAGNOSIS — I5023 Acute on chronic systolic (congestive) heart failure: Secondary | ICD-10-CM | POA: Diagnosis not present

## 2020-11-13 LAB — CBC
HCT: 29.8 % — ABNORMAL LOW (ref 39.0–52.0)
Hemoglobin: 8.9 g/dL — ABNORMAL LOW (ref 13.0–17.0)
MCH: 27.5 pg (ref 26.0–34.0)
MCHC: 29.9 g/dL — ABNORMAL LOW (ref 30.0–36.0)
MCV: 92 fL (ref 80.0–100.0)
Platelets: 258 10*3/uL (ref 150–400)
RBC: 3.24 MIL/uL — ABNORMAL LOW (ref 4.22–5.81)
RDW: 17.6 % — ABNORMAL HIGH (ref 11.5–15.5)
WBC: 10.6 10*3/uL — ABNORMAL HIGH (ref 4.0–10.5)
nRBC: 0 % (ref 0.0–0.2)

## 2020-11-13 LAB — GLUCOSE, CAPILLARY
Glucose-Capillary: 122 mg/dL — ABNORMAL HIGH (ref 70–99)
Glucose-Capillary: 155 mg/dL — ABNORMAL HIGH (ref 70–99)
Glucose-Capillary: 137 mg/dL — ABNORMAL HIGH (ref 70–99)
Glucose-Capillary: 138 mg/dL — ABNORMAL HIGH (ref 70–99)
Glucose-Capillary: 117 mg/dL — ABNORMAL HIGH (ref 70–99)

## 2020-11-13 LAB — RENAL FUNCTION PANEL
Albumin: 2.6 g/dL — ABNORMAL LOW (ref 3.5–5.0)
Anion gap: 11 (ref 5–15)
BUN: 35 mg/dL — ABNORMAL HIGH (ref 6–20)
CO2: 28 mmol/L (ref 22–32)
Calcium: 9.2 mg/dL (ref 8.9–10.3)
Chloride: 97 mmol/L — ABNORMAL LOW (ref 98–111)
Creatinine, Ser: 4.69 mg/dL — ABNORMAL HIGH (ref 0.61–1.24)
GFR, Estimated: 13 mL/min — ABNORMAL LOW (ref >60)
Glucose, Bld: 112 mg/dL — ABNORMAL HIGH (ref 70–99)
Phosphorus: 4.2 mg/dL (ref 2.5–4.6)
Potassium: 4.7 mmol/L (ref 3.5–5.1)
Sodium: 136 mmol/L (ref 135–145)

## 2020-11-13 LAB — PROTIME-INR
INR: 1.6 — ABNORMAL HIGH (ref 0.8–1.2)
Prothrombin Time: 18.4 seconds — ABNORMAL HIGH (ref 11.4–15.2)

## 2020-11-13 LAB — COOXEMETRY PANEL
Carboxyhemoglobin: 2 % — ABNORMAL HIGH (ref 0.5–1.5)
Methemoglobin: 0.6 % (ref 0.0–1.5)
O2 Saturation: 49.8 %
Total hemoglobin: 9 g/dL — ABNORMAL LOW (ref 12.0–16.0)

## 2020-11-13 LAB — MAGNESIUM: Magnesium: 2.1 mg/dL (ref 1.7–2.4)

## 2020-11-13 LAB — LACTATE DEHYDROGENASE: LDH: 204 U/L — ABNORMAL HIGH (ref 98–192)

## 2020-11-13 MED ORDER — WARFARIN SODIUM 1 MG PO TABS
1.5000 mg | ORAL_TABLET | Freq: Once | ORAL | Status: AC
  Administered 2020-11-13: 1.5 mg via ORAL
  Filled 2020-11-13: qty 1

## 2020-11-13 NOTE — Progress Notes (Signed)
Occupational Therapy Treatment Patient Details Name: Marcus Johnson. MRN: [id number] DOB: 07-Jun-1961 Today's Date: 11/13/2020    History of present illness 60 y.o. male admitted 2/28 with ICM for LVAD workup. Impella placed 3/1. Heartmate 3 LVAD placed 3/10. CRRT started 3/15. CRRT held 3/23-3/24 then restarted until 3/27 with iHD started 3/28. PMHx: DM2, CAD s/p CABG 4259, systolic HF due to ischemic cardiomyopathy with EF 20-25% (echo 12/15), DM2 and CKD. He is s/p Medtronic single chamber ICD. Covid 1/22.   OT comments  Excellent session today. Pt upbeat about his birthday and celebrating throughout session. Focus on mobility today. "I know it's my birthday but I know I need to walk". Min A with bed mobility (exited on R side) and transfers to power up. Improved awareness of equipment and steps to take prior to mobilizing.  Able to ambulate at least 220 ft today with 3 rest breaks with HHA and chair follow. Pt also using red tubing on utensil to help feed self using R hand. Left sitting up in chair to eat lunch and states he had already been up for an hour this am. Less complaints of sacral pain in sitting. Able to tolerated CIR when medically stable. As always, pt very appreciative. Will continue to follow acutely.  Follow Up Recommendations  CIR;Supervision/Assistance - 24 hour    Equipment Recommendations  3 in 1 bedside commode    Recommendations for Other Services Rehab consult    Precautions / Restrictions Precautions Precautions: Fall;Sternal;Other (comment) (LVAD) Required Braces or Orthoses: Other Brace Other Brace: thumb loop and buddy strap R hand       Mobility Bed Mobility Overal bed mobility: Needs Assistance       Supine to sit: Min assist          Transfers Overall transfer level: Needs assistance     Sit to Stand: Min assist              Balance Overall balance assessment: Needs assistance   Sitting balance-Leahy Scale: Good        Standing balance-Leahy Scale: Poor Standing balance comment: LUE support in standing; HHA                           ADL either performed or assessed with clinical judgement   ADL   Eating/Feeding: Supervision/ safety;Set up Eating/Feeding Details (indicate cue type and reason): fed self using R had @ 10 bites of potatoes using red tubing on spoon; thumb loop/buddy strap intact Grooming: Set up;Supervision/safety;Bed level                               Functional mobility during ADLs: Minimal assistance (power up to stand)       Vision       Perception     Praxis      Cognition Arousal/Alertness: Awake/alert Behavior During Therapy: WFL for tasks assessed/performed Overall Cognitive Status: Impaired/Different from baseline Area of Impairment: Attention;Memory;Following commands;Safety/judgement;Awareness;Problem solving                   Current Attention Level: Selective Memory: Decreased short-term memory Following Commands: Follows one step commands consistently Safety/Judgement: Decreased awareness of deficits Awareness: Emergent Problem Solving: Requires verbal cues General Comments: improved cognition from previous sessions        Exercises Other Exercises Other Exercises: counter pressure exercises before standing - crossing BLE in seated  position and completing isometrics x 5 Other Exercises: - encouraged - pinch/grip strengthening with/without R hand strapping system; squeeze ball x 15; clapping; transferring ball from one hand to the other   Shoulder Instructions       General Comments Pt able to correctly verbalie contents neededin bag; did not attempt power source switch today    Pertinent Vitals/ Pain       Pain Assessment: Faces Faces Pain Scale: Hurts little more Pain Location: sacrum; RUE Pain Descriptors / Indicators: Grimacing;Guarding Pain Intervention(s): Limited activity within patient's  tolerance;Repositioned  Home Living                                          Prior Functioning/Environment              Frequency  Min 3X/week        Progress Toward Goals  OT Goals(current goals can now be found in the care plan section)  Progress towards OT goals: Progressing toward goals  Acute Rehab OT Goals Patient Stated Goal: to go home and be with his grandchildren OT Goal Formulation: With patient Time For Goal Achievement: 11/14/20 Potential to Achieve Goals: Good ADL Goals Pt Will Perform Eating: with set-up;with adaptive utensils Pt Will Perform Grooming: with set-up;sitting Pt Will Transfer to Toilet: with supervision;bedside commode;ambulating Pt/caregiver will Perform Home Exercise Program: Increased ROM;Increased strength;Both right and left upper extremity;With minimal assist;With written HEP provided Additional ADL Goal #1: Pt will be able to recall sternal precautions Additional ADL Goal #2: Pt will be able to change out his lines from battery<>wall with min A Additional ADL Goal #3: Pt will demonstrate functional pinch pattern to pick up 10 objects with no drops using thumb loop and buddy strap R hand  Plan Discharge plan remains appropriate;Frequency remains appropriate    Co-evaluation                 AM-PAC OT "6 Clicks" Daily Activity     Outcome Measure   Help from another person eating meals?: A Little Help from another person taking care of personal grooming?: A Little Help from another person toileting, which includes using toliet, bedpan, or urinal?: A Lot Help from another person bathing (including washing, rinsing, drying)?: A Lot Help from another person to put on and taking off regular upper body clothing?: A Little Help from another person to put on and taking off regular lower body clothing?: A Lot 6 Click Score: 15    End of Session Equipment Utilized During Treatment: Gait belt  OT Visit Diagnosis:  Unsteadiness on feet (R26.81);Other abnormalities of gait and mobility (R26.89);Muscle weakness (generalized) (M62.81);Other symptoms and signs involving cognitive function;Dizziness and giddiness (R42);Pain;Low vision, both eyes (H54.2) Pain - Right/Left: Right Pain - part of body: Arm   Activity Tolerance Patient tolerated treatment well   Patient Left in chair;with call bell/phone within reach;with chair alarm set   Nurse Communication Mobility status        Time: 7619-5093 OT Time Calculation (min): 40 min  Charges: OT General Charges $OT Visit: 1 Visit OT Treatments $Self Care/Home Management : 8-22 mins $Therapeutic Activity: 23-37 mins  Maurie Boettcher, OT/L   Acute OT Clinical Specialist [place] Pager [phone] Office [phone]    [facility] 11/13/2020, 12:27 PM

## 2020-11-13 NOTE — Plan of Care (Signed)
Problem: Education: Goal: Knowledge of General Education information will improve Description: Including pain rating scale, medication(s)/side effects and non-pharmacologic comfort measures Outcome: Progressing   Problem: Health Behavior/Discharge Planning: Goal: Ability to manage health-related needs will improve Outcome: Progressing   Problem: Clinical Measurements: Goal: Ability to maintain clinical measurements within normal limits will improve Outcome: Progressing Goal: Will remain free from infection Outcome: Progressing Goal: Diagnostic test results will improve Outcome: Progressing Goal: Cardiovascular complication will be avoided Outcome: Progressing   Problem: Activity: Goal: Risk for activity intolerance will decrease Outcome: Progressing   Problem: Nutrition: Goal: Adequate nutrition will be maintained Outcome: Progressing   Problem: Coping: Goal: Level of anxiety will decrease Outcome: Progressing   Problem: Elimination: Goal: Will not experience complications related to bowel motility Outcome: Progressing   Problem: Pain Managment: Goal: General experience of comfort will improve Outcome: Progressing   Problem: Safety: Goal: Ability to remain free from injury will improve Outcome: Progressing   Problem: Skin Integrity: Goal: Risk for impaired skin integrity will decrease Outcome: Progressing   Problem: Education: Goal: Ability to demonstrate management of disease process will improve Outcome: Progressing Goal: Ability to verbalize understanding of medication therapies will improve Outcome: Progressing Goal: Individualized Educational Video(s) Outcome: Progressing   Problem: Activity: Goal: Capacity to carry out activities will improve Outcome: Progressing   Problem: Cardiac: Goal: Ability to achieve and maintain adequate cardiopulmonary perfusion will improve Outcome: Progressing   Problem: Education: Goal: Knowledge of disease or  condition will improve Outcome: Progressing Goal: Understanding of medication regimen will improve Outcome: Progressing Goal: Individualized Educational Video(s) Outcome: Progressing   Problem: Activity: Goal: Ability to tolerate increased activity will improve Outcome: Progressing   Problem: Cardiac: Goal: Ability to achieve and maintain adequate cardiopulmonary perfusion will improve Outcome: Progressing   Problem: Health Behavior/Discharge Planning: Goal: Ability to safely manage health-related needs after discharge will improve Outcome: Progressing   Problem: Education: Goal: Understanding of CV disease, CV risk reduction, and recovery process will improve Outcome: Progressing Goal: Individualized Educational Video(s) Outcome: Progressing   Problem: Activity: Goal: Ability to return to baseline activity level will improve Outcome: Progressing   Problem: Cardiovascular: Goal: Ability to achieve and maintain adequate cardiovascular perfusion will improve Outcome: Progressing Goal: Vascular access site(s) Level 0-1 will be maintained Outcome: Progressing   Problem: Health Behavior/Discharge Planning: Goal: Ability to safely manage health-related needs after discharge will improve Outcome: Progressing   Problem: Education: Goal: Knowledge of disease and its progression will improve Outcome: Progressing   Problem: Health Behavior/Discharge Planning: Goal: Ability to manage health-related needs will improve Outcome: Progressing   Problem: Clinical Measurements: Goal: Complications related to the disease process or treatment will be avoided or minimized Outcome: Progressing Goal: Dialysis access will remain free of complications Outcome: Progressing   Problem: Activity: Goal: Activity intolerance will improve Outcome: Progressing   Problem: Fluid Volume: Goal: Fluid volume balance will be maintained or improved Outcome: Progressing   Problem:  Nutritional: Goal: Ability to make appropriate dietary choices will improve Outcome: Progressing   Problem: Respiratory: Goal: Respiratory symptoms related to disease process will be avoided Outcome: Progressing   Problem: Self-Concept: Goal: Body image disturbance will be avoided or minimized Outcome: Progressing   Problem: Urinary Elimination: Goal: Progression of disease will be identified and treated Outcome: Progressing   Problem: Education: Goal: Knowledge of disease and its progression will improve Outcome: Progressing   Problem: Health Behavior/Discharge Planning: Goal: Ability to manage health-related needs will improve Outcome: Progressing   Problem: Clinical Measurements: Goal: Complications  related to the disease process or treatment will be avoided or minimized Outcome: Progressing Goal: Dialysis access will remain free of complications Outcome: Progressing   Problem: Activity: Goal: Activity intolerance will improve Outcome: Progressing   Problem: Fluid Volume: Goal: Fluid volume balance will be maintained or improved Outcome: Progressing   Problem: Nutritional: Goal: Ability to make appropriate dietary choices will improve Outcome: Progressing   Problem: Respiratory: Goal: Respiratory symptoms related to disease process will be avoided Outcome: Progressing   Problem: Self-Concept: Goal: Body image disturbance will be avoided or minimized Outcome: Progressing

## 2020-11-13 NOTE — Progress Notes (Signed)
ANTICOAGULATION CONSULT NOTE - Follow Up Consult  Pharmacy Consult for Warfarin Indication: LVAD  Allergies  Allergen Reactions  . Meclizine Hcl Anaphylaxis and Swelling  . Ivabradine Nausea Only    Patient Measurements: Height: 5' 10.5" (179.1 cm) Weight: 81.9 kg (180 lb 8.9 oz) IBW/kg (Calculated) : 74.15 Heparin Dosing Weight: 89.2 kg  Vital Signs: Temp: 97.9 F (36.6 C) (04/10 0759) Temp Source: Oral (04/10 0759) BP: 89/71 (04/10 0801)  Labs: Recent Labs    11/11/20 0342 11/11/20 0415 11/12/20 0500 11/13/20 0340  HGB 8.8*  --   --  8.9*  HCT 29.3*  --   --  29.8*  PLT 268  --   --  258  LABPROT  --  21.9* 19.2* 18.4*  INR  --  2.0* 1.7* 1.6*  CREATININE  --  4.78* 6.43* 4.69*    Estimated Creatinine Clearance: 17.6 mL/min (A) (by C-G formula based on SCr of 4.69 mg/dL (H)).   Medical History: Past Medical History:  Diagnosis Date  . AICD (automatic cardioverter/defibrillator) present    Single-chamber  implantable cardiac defibrillator - Medtronic  . Atrial fibrillation ([place])   . Cataract    Mixed form OU  . CHF (congestive heart failure) ([place])   . Chronic kidney disease   . Chronic kidney disease (CKD), stage III (moderate) (HCC)   . Chronic systolic heart failure ([place])    a. Echo 5/13: Mild LVE, mild LVH, EF 10%, anteroseptal, lateral, apical AK, mild MR, mild LAE, moderately reduced RVSF, mild RAE, PASP 60;  b. 07/2014 Echo: EF 20-2%, diff HK, AKI of antsep/apical/mid-apicalinferior, mod reduced RV.  Marland Kitchen Coronary artery disease    a. s/p CABG 2002 b. LHC 5/13:  dLM 80%, LAD subtotally occluded, pCFX occluded, pRCA 50%, mid? Occlusion with high take off of the PDA with 70% multiple lesions-not bypassed and supplies collaterals to LAD, LIMA-IM/ramus ok, S-OM ok, S-PLA branches ok. Medical therapy was recommended  . COVID   . Diabetic retinopathy (HCC)    NPDR OD, PDR OS  . Dyspnea   . Gout    "on daily RX" (01/08/2018)  . Hypertension   . Hypertensive  retinopathy    OU  . Ischemic cardiomyopathy    a. Echo 5/13: Mild LVE, mild LVH, EF 10%, anteroseptal, lateral, apical AK, mild MR, mild LAE, moderately reduced RVSF, mild RAE, PASP 60;  b. 01/2012 s/p MDT [id number] Protecta XT VR AICD;  c. 07/2014 Echo: EF 20-2%, diff HK, AKI of antsep/apical/mid-apicalinferior, mod reduced RV.  Marland Kitchen MRSA (methicillin resistant Staphylococcus aureus)    Status post right foot plantar deep infection with MRSA status post  I&D 10/2008  . Myocardial infarction [facility])    "was told I'd had several before heart OR in 2002" (01/08/2018)  . Noncompliance   . Peripheral neuropathy   . Retinopathy, diabetic, background ([place])   . Syncope   . Type II diabetes mellitus ([place])    requiring insulin   . Vitreous hemorrhage, left (HCC)    and proliferative diabetic retinopathy    Assessment: 60 yo M s/p new LVAD implant with HeartMate III on 3/10. Warfarin started per MD on 3/12, and pharmacy asked to take over warfarin dosing.   INR 2.9>2>1.6 - very sensitive, has been difficult to get into goal range. Discussed with Dr. Haroldine Laws, will not initiate heparin bridge for now.  He is drinking Ensure daily and eating well - per patient report. CBC stable with Hgb 8s, pltc 300. LDH stable 200s  Goal of Therapy:  INR 2-2.5 Monitor platelets by anticoagulation protocol: Yes   Plan:  Warfarin 1.5 mg x 1 tonight. Daily PT/INR, CBC, monitor s/sx bleeding  [place] Crane, Roylene Reason, BCCP Clinical Pharmacist  11/13/2020 2:16 PM   [facility] pharmacy phone numbers are listed on [email or web address]

## 2020-11-13 NOTE — Progress Notes (Signed)
Patient ID: Marcus Johnson., male   DOB: 02-Mar-1961, 60 y.o.   MRN: [id number]     Advanced Heart Failure Rounding Note  PCP-Cardiologist: No primary care provider on file.   Subjective:    - 2/27 Milrinone switched to dobutamine 5 mcg.  - 2/28 Moved to ICU. Dobutamine increased to 7.5 mcg and Norepi 3 mcg added. Diuretics held.  - 3/1 Impella 5.5 placed.  - 3/4 Teeth removed - 3/10 HM3 LVAD placed - 3/13 VAD speed increased to 5500. [name] out  - 3/14 Nephrology consulted. Given 1UPRBCs  - 3/15 Started CRRT. Given 1UPRBCs.  - 3/23 VAD Speed turned down to 5300  - 3/23 CRRT and Milrinone Stopped. Back in Afib - 3/24 CRRT restarted.  - 3/27 CVVH off.  - 3/28 iHD - 3/29 iHD. Walked 75 feet!! - 3/20 Ramp Echo: Fixed speed: 5200 Low speed limit: 4900 - 4/3 S/P RIJ HD catheter.  - [birth year] 60th B[birth year]day!   Feels better today. It is his 60th Bday! Was able to walk off the unit this am. Denies CP or SOB.   Co-ox 50%   LVAD Interrogation HM 3: Speed: 5200 Flow: 4.7  PI: 3.1 Power: 4.0 VAD interrogated personally. Parameters stable.  LDH 204 INR 1.7 -> 1.6  Objective:   Weight Range: 81.9 kg Body mass index is 25.54 kg/m.   Vital Signs:   Temp:  [97.9 F (36.6 C)-98.6 F (37 C)] 97.9 F (36.6 C) (04/10 0759) Pulse Rate:  [88] 88 (04/09 2300) Resp:  [10-20] 14 (04/10 0801) BP: (89-121)/(45-87) 89/71 (04/10 0801) SpO2:  [96 %] 96 % (04/09 1300) Weight:  [81.9 kg] 81.9 kg (04/10 0600) Last BM Date: 11/12/20  Weight change: Filed Weights   11/11/20 0358 11/12/20 0500 11/13/20 0600  Weight: 83.3 kg 83.4 kg 81.9 kg    Intake/Output:   Intake/Output Summary (Last 24 hours) at 11/13/2020 1216 Last data filed at 11/13/2020 0800 Gross per 24 hour  Intake 240 ml  Output 2000 ml  Net -1760 ml      Physical Exam   MAP 80s General:  Sitting up in bed NAD.  HEENT: normal  Neck: supple. JVP not elevated. RIJ tunneled cath Carotids 2+ bilat; no bruits. No  lymphadenopathy or thryomegaly appreciated. Cor: LVAD hum.  Lungs: Clear. Abdomen: obese soft, nontender, non-distended. No hepatosplenomegaly. No bruits or masses. Good bowel sounds. Driveline site clean. Anchor in place.  Extremities: no cyanosis, clubbing, rash. Warm no edema  RUE in splint Neuro: alert & oriented x 3. No focal deficits. Moves all 4 without problem     Telemetry   NSR 80s with occasional PVCs Personally reviewed   Labs    CBC Recent Labs    11/11/20 0342 11/13/20 0340  WBC 10.2 10.6*  HGB 8.8* 8.9*  HCT 29.3* 29.8*  MCV 91.6 92.0  PLT 268 193   Basic Metabolic Panel Recent Labs    11/12/20 0500 11/13/20 0340  NA 136 136  K 5.0 4.7  CL 96* 97*  CO2 29 28  GLUCOSE 135* 112*  BUN 58* 35*  CREATININE 6.43* 4.69*  CALCIUM 9.3 9.2  MG 2.2 2.1  PHOS 5.4* 4.2   Liver Function Tests Recent Labs    11/12/20 0500 11/13/20 0340  ALBUMIN 2.7* 2.6*   No results for input(s): LIPASE, AMYLASE in the last 72 hours. Cardiac Enzymes No results for input(s): CKTOTAL, CKMB, CKMBINDEX, TROPONINI in the last 72 hours.  BNP: BNP (last 3 results) Recent  Labs    10/27/20 0020 11/02/20 2310 11/10/20 0150  BNP 774.9* 486.0* 905.1*    ProBNP (last 3 results) No results for input(s): PROBNP in the last 8760 hours.   D-Dimer No results for input(s): DDIMER in the last 72 hours. Hemoglobin A1C No results for input(s): HGBA1C in the last 72 hours. Fasting Lipid Panel No results for input(s): CHOL, HDL, LDLCALC, TRIG, CHOLHDL, LDLDIRECT in the last 72 hours. Thyroid Function Tests No results for input(s): TSH, T4TOTAL, T3FREE, THYROIDAB in the last 72 hours.  Invalid input(s): FREET3  Other results:   Imaging    No results found.   Medications:     Scheduled Medications: . (feeding supplement) PROSource Plus  30 mL Oral BID BM  . amiodarone  200 mg Oral Daily  . aspirin EC  81 mg Oral Daily  . Chlorhexidine Gluconate Cloth  6 each  Topical Daily  . Chlorhexidine Gluconate Cloth  6 each Topical Q0600  . collagenase   Topical Daily  . [START ON 11/15/2020] darbepoetin (ARANESP) injection - DIALYSIS  100 mcg Intravenous Q Tue-HD  . feeding supplement (NEPRO CARB STEADY)  237 mL Oral BID BM  . gabapentin  100 mg Oral QHS  . insulin aspart  0-15 Units Subcutaneous TID WC  . insulin aspart  0-5 Units Subcutaneous QHS  . insulin glargine  15 Units Subcutaneous Daily  . metoCLOPramide  5 mg Oral TID AC  . midodrine  10 mg Oral 3 times per day on Tue Thu Sat  . midodrine  5 mg Oral 3 times per day on Sun Mon Wed Fri  . multivitamin  1 tablet Oral QHS  . pantoprazole  40 mg Oral Daily  . polyethylene glycol  17 g Oral BID  . rosuvastatin  20 mg Oral Daily  . senna-docusate  2 tablet Oral QHS  . sodium chloride flush  10-40 mL Intracatheter Q12H  . Warfarin - Pharmacist Dosing Inpatient   Does not apply q1600    Infusions: . sodium chloride    . sodium chloride    . ferric gluconate (FERRLECIT/NULECIT) IV Stopped (11/10/20 1826)  . lactated ringers Stopped (10/17/20 1655)    PRN Medications: sodium chloride, sodium chloride, acetaminophen, alteplase, dextrose, fentaNYL (SUBLIMAZE) injection, heparin, heparin, lidocaine (PF), lidocaine-prilocaine, ondansetron (ZOFRAN) IV, oxyCODONE, pentafluoroprop-tetrafluoroeth, senna-docusate, sodium chloride flush, traZODone, white petrolatum    Assessment/Plan   1.Acute on chronic systolic HF -> cardiogenic shock:ICM.HasMedtronic ICD. EF 15%. Low output persisted despite milrinone and then dobutamine with rise in creatinine to 2.5. Impella 5.5 placed 3/1.  Creatinine improved to 1.7 and HM3 LVAD was placed on 3/10. Developed post-op renal failure requiring CVVH. Now off milrinone and epinephrine. Last echo showed moderate RV enlargement with mildly decreased RV function and VAD speed decreased to 5300 due to left-sided septum. CRRT stopped 3/23 but restarted 3/24. CVVH stopped  again on 3/27. Ramp echo 3/30 with speed decreased to 5200. Tolerating iHD for volume management.  MAPs 80s Co-ox marginal at 50% may be slightly dry after HD - Cut back midodrine 5 mg tid on non HD days, continue 10 tid on HD days. Continue to gradually decrease.  - Off sildenafil with orthostasis.  - Continue compression stockings.  - HD  volume management.   - c/w Reglan and zofran for nausea. Off scopolamine patch due to dizziness. Symptoms improved  2. LVAD: 3/13 VAD speed increased to 5500. 3/23 speed decreased to 5300 and decreased to 5200 on 3/30. VAD interrogated personally. LDH  204, INR 1.6  - On warfarin, on ASA 81 daily. Discussed dosing with PharmD personally. If INR drops again will need heparin  - DL site ok 3. CAD s/p CABG2002:Last cath in 6/19 with patent grafts. - no s/s angina  - Continue Crestor 40 mg daily.  4. AKI on CKD Stage 3b: Suspect that this is a combination of cardiorenal syndrome and diabetic nephropathy and possibly ATN. Developed post-op AKI. Started on CRRT 3/15. CVVH stopped 3/23 but restarted 3/24. Now getting iHD, looks like he will be HD-dependent.  Has tunneled catheter.  - HD T/Th/S 5. Atrial flutter/fibrillation: s/p DC-CV 08/21/18. Maintaining NSR.     - Continue amio 200 mg daily.   6. Wound R Foot: Partial thickness skin loss noted. Cover with mepilex border and change every 5 days. Resolved.  7. Right arm pain: Likely neuropathic from stretch of brachial plexus with Impella placement.  8. ID:  WBCs normal. Afebrile. Blood Cx- NGTD.  9. SW:FUXNATF of very poor control but hgbA1ctrending down. Most recent 7.7%.  Insulin drip stopped 3/24. Blood glucose controlled on reduced Lantus and  sliding scale insulin to moderate.   - Diabetes coordinators have seen 10. Anemia: Has been stable.  11. Deconditioning - PT/OT following, continue aggressive PT/OT efforts - CIR consulted, hopefully to CIR early next week.  12. Unstageable Pressure Ulcer on  buttocks: Improving.  - On hydrotherapy Mon-Sat.  - Continue reposition every 2 hours side lying position. Reposition frequently in the chair. 13. NSVT: Rare.  Renal Navigator following.  Continue PT/OT. CIR hopefully early next week.   Glori Bickers MD 11/13/2020 12:16 PM

## 2020-11-13 NOTE — Progress Notes (Signed)
[facility] Progress Note    Assessment/ Plan:   Acute kidney injury on chronic kidney disease stage III: With underlying chronic kidney disease likely from diabetes/hypertension and worsening renal function from hemodynamically mediated ATN and cardiorenal syndrome. CRRT 3/15-3/22, 3/24-27; HD since 3/28. -Unfortunately, no signs of renal recovery at the moment - s/p [facility] 11/07/20 with IR, appreciate assistance - HD TTS schedule now--> next HD 11/14/20 - Outpt CLIP underway--> renal navigator following for improvement.    Acute exacerbation of chronic systolic heart failure with cardiogenic shock: LVAD, placed 3/10. Volume status/UF managed with RRT.   Per above UF to euvolemia.  midodrine and warfarin, off Revatio d/t orthostasis  Anemia:Likely secondary to chronic disease and exacerbated by recent surgical losses.  Transfuse PRBC as needed.  Iron deficient, has been repleted IV.  ESA seems to have fallen off MAR- reinstated for 100 mcg q Tuesday  Atrial fibrillation: per cardiology, on amiodarone  Protein calorie malnutrition, hypoalbuminemia: push protein, per primary  Pressure ulcer: getting hydrotherapy  DM II: per primary  Vertigo: scopolamine patch, Epley maneuvers with PT  Dispo: hopeful for CIR.  Pt will need to be able to sit in a chair for OP dialysis  Subjective:    Today is his 44th birthday!!  Good spirits.  HD yesterday with 2 L off.   Objective:   BP (!) 89/71 (BP Location: Left Arm)   Pulse 88   Temp 97.9 F (36.6 C) (Oral)   Resp 14   Ht 5' 10.5" (1.791 m)   Wt 81.9 kg   SpO2 96%   BMI 25.54 kg/m   Intake/Output Summary (Last 24 hours) at 11/13/2020 1043 Last data filed at 11/13/2020 0800 Gross per 24 hour  Intake 240 ml  Output 2000 ml  Net -1760 ml   Weight change: -1.5 kg  Physical Exam: Gen: NAD, lying in bed awake and alert CVS: mechanical hum throughout  Resp: clear  Abd: soft, + drive line Ext: no LE edema,  ACCESS:  R TDC c/d/i  Imaging: No results found.  Labs: BMET Recent Labs  Lab 11/07/20 0923 11/08/20 0420 11/09/20 0330 11/10/20 0150 11/11/20 0415 11/12/20 0500 11/13/20 0340  NA 136 136 134* 134* 134* 136 136  K 4.8 5.5* 4.9 5.2* 4.9 5.0 4.7  CL 96* 97* 96* 96* 97* 96* 97*  CO2 28 26 27 26 28 29 28   GLUCOSE 79 110* 73 174* 110* 135* 112*  BUN 57* 70* 40* 57* 39* 58* 35*  CREATININE 5.85* 6.75* 4.85* 6.09* 4.78* 6.43* 4.69*  CALCIUM 9.3 9.1 9.2 8.9 9.1 9.3 9.2  PHOS 6.9* 7.5* 5.4* 6.4* 4.7* 5.4* 4.2   CBC Recent Labs  Lab 11/09/20 0329 11/10/20 0150 11/11/20 0342 11/13/20 0340  WBC 8.3 9.7 10.2 10.6*  HGB 8.8* 8.7* 8.8* 8.9*  HCT 28.8* 28.6* 29.3* 29.8*  MCV 89.4 89.7 91.6 92.0  PLT 246 251 268 258    Medications:    . (feeding supplement) PROSource Plus  30 mL Oral BID BM  . amiodarone  200 mg Oral Daily  . aspirin EC  81 mg Oral Daily  . Chlorhexidine Gluconate Cloth  6 each Topical Daily  . Chlorhexidine Gluconate Cloth  6 each Topical Q0600  . collagenase   Topical Daily  . [START ON 11/15/2020] darbepoetin (ARANESP) injection - DIALYSIS  100 mcg Intravenous Q Tue-HD  . feeding supplement (NEPRO CARB STEADY)  237 mL Oral BID BM  . gabapentin  100 mg Oral QHS  .  insulin aspart  0-15 Units Subcutaneous TID WC  . insulin aspart  0-5 Units Subcutaneous QHS  . insulin glargine  15 Units Subcutaneous Daily  . metoCLOPramide  5 mg Oral TID AC  . midodrine  10 mg Oral 3 times per day on Tue Thu Sat  . midodrine  5 mg Oral 3 times per day on Sun Mon Wed Fri  . multivitamin  1 tablet Oral QHS  . pantoprazole  40 mg Oral Daily  . polyethylene glycol  17 g Oral BID  . rosuvastatin  20 mg Oral Daily  . senna-docusate  2 tablet Oral QHS  . sodium chloride flush  10-40 mL Intracatheter Q12H  . Warfarin - Pharmacist Dosing Inpatient   Does not apply q1600      Madelon Lips, MD 11/13/2020, 10:43 AM

## 2020-11-14 ENCOUNTER — Encounter (HOSPITAL_COMMUNITY): Payer: Self-pay | Admitting: Physical Medicine & Rehabilitation

## 2020-11-14 ENCOUNTER — Inpatient Hospital Stay (HOSPITAL_COMMUNITY)
Admission: RE | Admit: 2020-11-14 | Discharge: 2020-12-09 | DRG: 945 | Disposition: A | Discharge status: Home Health | Payer: Medicare HMO | Source: Intra-hospital | Attending: Physical Medicine & Rehabilitation | Admitting: Physical Medicine & Rehabilitation

## 2020-11-14 ENCOUNTER — Other Ambulatory Visit: Payer: Self-pay

## 2020-11-14 ENCOUNTER — Telehealth (HOSPITAL_COMMUNITY): Payer: Self-pay | Admitting: Pharmacy Technician

## 2020-11-14 DIAGNOSIS — E1143 Type 2 diabetes mellitus with diabetic autonomic (poly)neuropathy: Secondary | ICD-10-CM | POA: Diagnosis not present

## 2020-11-14 DIAGNOSIS — I255 Ischemic cardiomyopathy: Secondary | ICD-10-CM | POA: Diagnosis present

## 2020-11-14 DIAGNOSIS — E1129 Type 2 diabetes mellitus with other diabetic kidney complication: Secondary | ICD-10-CM | POA: Diagnosis not present

## 2020-11-14 DIAGNOSIS — I5023 Acute on chronic systolic (congestive) heart failure: Secondary | ICD-10-CM | POA: Diagnosis not present

## 2020-11-14 DIAGNOSIS — Z951 Presence of aortocoronary bypass graft: Secondary | ICD-10-CM | POA: Diagnosis not present

## 2020-11-14 DIAGNOSIS — E669 Obesity, unspecified: Secondary | ICD-10-CM | POA: Diagnosis present

## 2020-11-14 DIAGNOSIS — Z8616 Personal history of COVID-19: Secondary | ICD-10-CM | POA: Diagnosis not present

## 2020-11-14 DIAGNOSIS — Z79899 Other long term (current) drug therapy: Secondary | ICD-10-CM

## 2020-11-14 DIAGNOSIS — Z95811 Presence of heart assist device: Secondary | ICD-10-CM | POA: Diagnosis not present

## 2020-11-14 DIAGNOSIS — R5381 Other malaise: Principal | ICD-10-CM | POA: Diagnosis present

## 2020-11-14 DIAGNOSIS — Z7984 Long term (current) use of oral hypoglycemic drugs: Secondary | ICD-10-CM

## 2020-11-14 DIAGNOSIS — N250 Renal osteodystrophy: Secondary | ICD-10-CM | POA: Diagnosis present

## 2020-11-14 DIAGNOSIS — E46 Unspecified protein-calorie malnutrition: Secondary | ICD-10-CM | POA: Diagnosis present

## 2020-11-14 DIAGNOSIS — N1831 Chronic kidney disease, stage 3a: Secondary | ICD-10-CM | POA: Diagnosis not present

## 2020-11-14 DIAGNOSIS — R570 Cardiogenic shock: Secondary | ICD-10-CM | POA: Diagnosis not present

## 2020-11-14 DIAGNOSIS — L89153 Pressure ulcer of sacral region, stage 3: Secondary | ICD-10-CM | POA: Diagnosis present

## 2020-11-14 DIAGNOSIS — E875 Hyperkalemia: Secondary | ICD-10-CM | POA: Diagnosis present

## 2020-11-14 DIAGNOSIS — Z833 Family history of diabetes mellitus: Secondary | ICD-10-CM

## 2020-11-14 DIAGNOSIS — Z992 Dependence on renal dialysis: Secondary | ICD-10-CM | POA: Diagnosis not present

## 2020-11-14 DIAGNOSIS — K3184 Gastroparesis: Secondary | ICD-10-CM | POA: Diagnosis not present

## 2020-11-14 DIAGNOSIS — I951 Orthostatic hypotension: Secondary | ICD-10-CM

## 2020-11-14 DIAGNOSIS — D649 Anemia, unspecified: Secondary | ICD-10-CM | POA: Diagnosis not present

## 2020-11-14 DIAGNOSIS — R112 Nausea with vomiting, unspecified: Secondary | ICD-10-CM

## 2020-11-14 DIAGNOSIS — I482 Chronic atrial fibrillation, unspecified: Secondary | ICD-10-CM | POA: Diagnosis not present

## 2020-11-14 DIAGNOSIS — I132 Hypertensive heart and chronic kidney disease with heart failure and with stage 5 chronic kidney disease, or end stage renal disease: Secondary | ICD-10-CM | POA: Diagnosis not present

## 2020-11-14 DIAGNOSIS — Z6825 Body mass index (BMI) 25.0-25.9, adult: Secondary | ICD-10-CM | POA: Diagnosis not present

## 2020-11-14 DIAGNOSIS — I2510 Atherosclerotic heart disease of native coronary artery without angina pectoris: Secondary | ICD-10-CM | POA: Diagnosis present

## 2020-11-14 DIAGNOSIS — L89152 Pressure ulcer of sacral region, stage 2: Secondary | ICD-10-CM | POA: Diagnosis present

## 2020-11-14 DIAGNOSIS — E113293 Type 2 diabetes mellitus with mild nonproliferative diabetic retinopathy without macular edema, bilateral: Secondary | ICD-10-CM | POA: Diagnosis present

## 2020-11-14 DIAGNOSIS — N186 End stage renal disease: Secondary | ICD-10-CM | POA: Diagnosis present

## 2020-11-14 DIAGNOSIS — E611 Iron deficiency: Secondary | ICD-10-CM | POA: Diagnosis present

## 2020-11-14 DIAGNOSIS — I4891 Unspecified atrial fibrillation: Secondary | ICD-10-CM | POA: Diagnosis not present

## 2020-11-14 DIAGNOSIS — Z8249 Family history of ischemic heart disease and other diseases of the circulatory system: Secondary | ICD-10-CM

## 2020-11-14 DIAGNOSIS — E1142 Type 2 diabetes mellitus with diabetic polyneuropathy: Secondary | ICD-10-CM | POA: Diagnosis present

## 2020-11-14 DIAGNOSIS — H35033 Hypertensive retinopathy, bilateral: Secondary | ICD-10-CM | POA: Diagnosis present

## 2020-11-14 DIAGNOSIS — D638 Anemia in other chronic diseases classified elsewhere: Secondary | ICD-10-CM

## 2020-11-14 DIAGNOSIS — I509 Heart failure, unspecified: Secondary | ICD-10-CM | POA: Diagnosis not present

## 2020-11-14 DIAGNOSIS — E1122 Type 2 diabetes mellitus with diabetic chronic kidney disease: Secondary | ICD-10-CM | POA: Diagnosis present

## 2020-11-14 DIAGNOSIS — K5900 Constipation, unspecified: Secondary | ICD-10-CM | POA: Diagnosis not present

## 2020-11-14 DIAGNOSIS — I5042 Chronic combined systolic (congestive) and diastolic (congestive) heart failure: Secondary | ICD-10-CM | POA: Diagnosis present

## 2020-11-14 DIAGNOSIS — Z794 Long term (current) use of insulin: Secondary | ICD-10-CM

## 2020-11-14 DIAGNOSIS — I5043 Acute on chronic combined systolic (congestive) and diastolic (congestive) heart failure: Secondary | ICD-10-CM | POA: Diagnosis not present

## 2020-11-14 DIAGNOSIS — I5022 Chronic systolic (congestive) heart failure: Secondary | ICD-10-CM | POA: Diagnosis not present

## 2020-11-14 DIAGNOSIS — E1169 Type 2 diabetes mellitus with other specified complication: Secondary | ICD-10-CM | POA: Diagnosis not present

## 2020-11-14 DIAGNOSIS — Z95810 Presence of automatic (implantable) cardiac defibrillator: Secondary | ICD-10-CM

## 2020-11-14 DIAGNOSIS — N170 Acute kidney failure with tubular necrosis: Secondary | ICD-10-CM | POA: Diagnosis not present

## 2020-11-14 DIAGNOSIS — M109 Gout, unspecified: Secondary | ICD-10-CM | POA: Diagnosis present

## 2020-11-14 DIAGNOSIS — N179 Acute kidney failure, unspecified: Secondary | ICD-10-CM | POA: Diagnosis not present

## 2020-11-14 DIAGNOSIS — H8110 Benign paroxysmal vertigo, unspecified ear: Secondary | ICD-10-CM | POA: Diagnosis present

## 2020-11-14 DIAGNOSIS — D631 Anemia in chronic kidney disease: Secondary | ICD-10-CM | POA: Diagnosis present

## 2020-11-14 DIAGNOSIS — R531 Weakness: Secondary | ICD-10-CM

## 2020-11-14 DIAGNOSIS — Z888 Allergy status to other drugs, medicaments and biological substances status: Secondary | ICD-10-CM

## 2020-11-14 DIAGNOSIS — I130 Hypertensive heart and chronic kidney disease with heart failure and stage 1 through stage 4 chronic kidney disease, or unspecified chronic kidney disease: Secondary | ICD-10-CM | POA: Diagnosis not present

## 2020-11-14 LAB — COOXEMETRY PANEL
Carboxyhemoglobin: 1.5 % (ref 0.5–1.5)
Carboxyhemoglobin: 2.1 % — ABNORMAL HIGH (ref 0.5–1.5)
Methemoglobin: 0.6 % (ref 0.0–1.5)
Methemoglobin: 1.2 % (ref 0.0–1.5)
O2 Saturation: 28.4 %
O2 Saturation: 46.9 %
Total hemoglobin: 10.2 g/dL — ABNORMAL LOW (ref 12.0–16.0)
Total hemoglobin: 6.7 g/dL — CL (ref 12.0–16.0)

## 2020-11-14 LAB — LACTATE DEHYDROGENASE: LDH: 199 U/L — ABNORMAL HIGH (ref 98–192)

## 2020-11-14 LAB — GLUCOSE, CAPILLARY
Glucose-Capillary: 120 mg/dL — ABNORMAL HIGH (ref 70–99)
Glucose-Capillary: 114 mg/dL — ABNORMAL HIGH (ref 70–99)
Glucose-Capillary: 119 mg/dL — ABNORMAL HIGH (ref 70–99)
Glucose-Capillary: 135 mg/dL — ABNORMAL HIGH (ref 70–99)

## 2020-11-14 LAB — CBC
HCT: 29.2 % — ABNORMAL LOW (ref 39.0–52.0)
Hemoglobin: 8.8 g/dL — ABNORMAL LOW (ref 13.0–17.0)
MCH: 27.2 pg (ref 26.0–34.0)
MCHC: 30.1 g/dL (ref 30.0–36.0)
MCV: 90.4 fL (ref 80.0–100.0)
Platelets: 248 10*3/uL (ref 150–400)
RBC: 3.23 MIL/uL — ABNORMAL LOW (ref 4.22–5.81)
RDW: 17.4 % — ABNORMAL HIGH (ref 11.5–15.5)
WBC: 10 10*3/uL (ref 4.0–10.5)
nRBC: 0 % (ref 0.0–0.2)

## 2020-11-14 LAB — RENAL FUNCTION PANEL
Albumin: 2.7 g/dL — ABNORMAL LOW (ref 3.5–5.0)
Anion gap: 10 (ref 5–15)
BUN: 53 mg/dL — ABNORMAL HIGH (ref 6–20)
CO2: 27 mmol/L (ref 22–32)
Calcium: 9.5 mg/dL (ref 8.9–10.3)
Chloride: 98 mmol/L (ref 98–111)
Creatinine, Ser: 6.63 mg/dL — ABNORMAL HIGH (ref 0.61–1.24)
GFR, Estimated: 9 mL/min — ABNORMAL LOW (ref >60)
Glucose, Bld: 117 mg/dL — ABNORMAL HIGH (ref 70–99)
Phosphorus: 6.6 mg/dL — ABNORMAL HIGH (ref 2.5–4.6)
Potassium: 5.5 mmol/L — ABNORMAL HIGH (ref 3.5–5.1)
Sodium: 135 mmol/L (ref 135–145)

## 2020-11-14 LAB — HEPARIN LEVEL (UNFRACTIONATED): Heparin Unfractionated: <0.1 IU/mL — ABNORMAL LOW (ref 0.30–0.70)

## 2020-11-14 LAB — PROTIME-INR
INR: 1.4 — ABNORMAL HIGH (ref 0.8–1.2)
Prothrombin Time: 16.9 seconds — ABNORMAL HIGH (ref 11.4–15.2)

## 2020-11-14 LAB — MAGNESIUM: Magnesium: 2.3 mg/dL (ref 1.7–2.4)

## 2020-11-14 MED ORDER — NEPRO/CARBSTEADY PO LIQD
237.0000 mL | Freq: Three times a day (TID) | ORAL | Status: DC
  Administered 2020-11-14: 237 mL via ORAL

## 2020-11-14 MED ORDER — HEPARIN (PORCINE) 25000 UT/250ML-% IV SOLN: 800.0000 [IU]/h | INTRAVENOUS | Status: DC

## 2020-11-14 MED ORDER — ROSUVASTATIN CALCIUM 20 MG PO TABS
20.0000 mg | ORAL_TABLET | Freq: Every day | ORAL | Status: DC
  Administered 2020-11-15 – 2020-11-25 (×11): 20 mg via ORAL
  Filled 2020-11-14 (×11): qty 1

## 2020-11-14 MED ORDER — PROCHLORPERAZINE EDISYLATE 10 MG/2ML IJ SOLN
5.0000 mg | Freq: Four times a day (QID) | INTRAMUSCULAR | Status: DC | PRN
  Administered 2020-11-22: 5 mg via INTRAMUSCULAR
  Filled 2020-11-14: qty 2

## 2020-11-14 MED ORDER — WARFARIN SODIUM 4 MG PO TABS: 4.0000 mg | ORAL_TABLET | Freq: Once | ORAL | Status: DC

## 2020-11-14 MED ORDER — SORBITOL 70 % SOLN: 30.0000 mL | Freq: Once | 0 refills | Status: DC

## 2020-11-14 MED ORDER — PROCHLORPERAZINE 25 MG RE SUPP: 12.5000 mg | Freq: Four times a day (QID) | RECTAL | Status: DC | PRN

## 2020-11-14 MED ORDER — METOCLOPRAMIDE HCL 5 MG PO TABS: 5.0000 mg | ORAL_TABLET | Freq: Three times a day (TID) | ORAL | Status: DC

## 2020-11-14 MED ORDER — SODIUM ZIRCONIUM CYCLOSILICATE 10 G PO PACK
10.0000 g | PACK | Freq: Once | ORAL | Status: AC
  Administered 2020-11-14: 10 g via ORAL
  Filled 2020-11-14: qty 1

## 2020-11-14 MED ORDER — TRAZODONE HCL 50 MG PO TABS: 50.0000 mg | ORAL_TABLET | Freq: Every evening | ORAL | Status: DC | PRN

## 2020-11-14 MED ORDER — WARFARIN SODIUM 2 MG PO TABS
4.0000 mg | ORAL_TABLET | Freq: Once | ORAL | Status: AC
  Administered 2020-11-14: 4 mg via ORAL
  Filled 2020-11-14: qty 2

## 2020-11-14 MED ORDER — GABAPENTIN 100 MG PO CAPS: 100.0000 mg | ORAL_CAPSULE | Freq: Every day | ORAL | Status: DC

## 2020-11-14 MED ORDER — POLYETHYLENE GLYCOL 3350 17 G PO PACK: 17.0000 g | PACK | Freq: Every day | ORAL | Status: DC | PRN

## 2020-11-14 MED ORDER — SORBITOL 70 % SOLN
30.0000 mL | Freq: Once | Status: AC
  Administered 2020-11-14: 30 mL via ORAL
  Filled 2020-11-14: qty 30

## 2020-11-14 MED ORDER — LIDOCAINE HCL (PF) 1 % IJ SOLN: 5.0000 mL | INTRAMUSCULAR | 0 refills | Status: DC | PRN

## 2020-11-14 MED ORDER — PROSOURCE PLUS PO LIQD: 30.0000 mL | Freq: Two times a day (BID) | ORAL | Status: DC

## 2020-11-14 MED ORDER — HEPARIN SODIUM (PORCINE) 1000 UNIT/ML DIALYSIS: 1000.0000 [IU] | INTRAMUSCULAR | Status: DC | PRN

## 2020-11-14 MED ORDER — INSULIN GLARGINE 100 UNIT/ML ~~LOC~~ SOLN
15.0000 [IU] | Freq: Every day | SUBCUTANEOUS | Status: DC
  Administered 2020-11-15 – 2020-11-20 (×6): 15 [IU] via SUBCUTANEOUS
  Filled 2020-11-14 (×6): qty 0.15

## 2020-11-14 MED ORDER — LIDOCAINE-PRILOCAINE 2.5-2.5 % EX CREA: 1.0000 "application " | TOPICAL_CREAM | CUTANEOUS | 0 refills | Status: DC | PRN

## 2020-11-14 MED ORDER — SODIUM CHLORIDE 0.9% FLUSH: 10.0000 mL | Freq: Two times a day (BID) | INTRAVENOUS | Status: DC

## 2020-11-14 MED ORDER — HEPARIN (PORCINE) 25000 UT/250ML-% IV SOLN
800.0000 [IU]/h | INTRAVENOUS | Status: DC
  Administered 2020-11-14: 800 [IU]/h via INTRAVENOUS
  Filled 2020-11-14: qty 250

## 2020-11-14 MED ORDER — INSULIN ASPART 100 UNIT/ML ~~LOC~~ SOLN: 0.0000 [IU] | Freq: Every day | SUBCUTANEOUS | 11 refills | Status: DC

## 2020-11-14 MED ORDER — TRAZODONE HCL 50 MG PO TABS
50.0000 mg | ORAL_TABLET | Freq: Every evening | ORAL | Status: DC | PRN
  Administered 2020-11-18 – 2020-12-06 (×5): 50 mg via ORAL
  Filled 2020-11-14 (×5): qty 1

## 2020-11-14 MED ORDER — INSULIN GLARGINE 100 UNIT/ML ~~LOC~~ SOLN: 15.0000 [IU] | Freq: Every day | SUBCUTANEOUS | 11 refills | Status: DC

## 2020-11-14 MED ORDER — SODIUM CHLORIDE 0.9% FLUSH: 10.0000 mL | INTRAVENOUS | Status: DC | PRN

## 2020-11-14 MED ORDER — SENNOSIDES-DOCUSATE SODIUM 8.6-50 MG PO TABS: 2.0000 | ORAL_TABLET | Freq: Every evening | ORAL | Status: DC | PRN

## 2020-11-14 MED ORDER — PROCHLORPERAZINE MALEATE 5 MG PO TABS
5.0000 mg | ORAL_TABLET | Freq: Four times a day (QID) | ORAL | Status: DC | PRN
  Administered 2020-11-20: 10 mg via ORAL
  Filled 2020-11-14 (×2): qty 2

## 2020-11-14 MED ORDER — MIDODRINE HCL 5 MG PO TABS: 5.0000 mg | ORAL_TABLET | Freq: Three times a day (TID) | ORAL | Status: DC

## 2020-11-14 MED ORDER — ASCORBIC ACID 500 MG PO TABS
1000.0000 mg | ORAL_TABLET | Freq: Every day | ORAL | Status: DC
  Administered 2020-11-14 – 2020-12-09 (×26): 1000 mg via ORAL
  Filled 2020-11-14 (×26): qty 2

## 2020-11-14 MED ORDER — SODIUM CHLORIDE 0.9 % IV SOLN
100.0000 mL | INTRAVENOUS | Status: DC | PRN
  Administered 2020-11-17: 10 mL via INTRAVENOUS

## 2020-11-14 MED ORDER — ROSUVASTATIN CALCIUM 20 MG PO TABS: 20.0000 mg | ORAL_TABLET | Freq: Every day | ORAL | Status: DC

## 2020-11-14 MED ORDER — WARFARIN - PHARMACIST DOSING INPATIENT: Freq: Every day | Status: DC

## 2020-11-14 MED ORDER — RENA-VITE PO TABS: 1.0000 | ORAL_TABLET | Freq: Every day | ORAL | 0 refills | Status: DC

## 2020-11-14 MED ORDER — SODIUM CHLORIDE 0.9 % IV SOLN: 125.0000 mg | INTRAVENOUS | Status: DC

## 2020-11-14 MED ORDER — DARBEPOETIN ALFA 100 MCG/0.5ML IJ SOSY
100.0000 ug | PREFILLED_SYRINGE | INTRAMUSCULAR | Status: DC
  Administered 2020-12-06: 100 ug via INTRAVENOUS
  Filled 2020-11-14 (×4): qty 0.5

## 2020-11-14 MED ORDER — BISACODYL 10 MG RE SUPP
10.0000 mg | Freq: Every day | RECTAL | Status: DC | PRN
  Administered 2020-11-20: 10 mg via RECTAL
  Filled 2020-11-14: qty 1

## 2020-11-14 MED ORDER — SODIUM CHLORIDE 0.9 % IV SOLN: 100.0000 mL | INTRAVENOUS | 0 refills | Status: DC | PRN

## 2020-11-14 MED ORDER — CHLORHEXIDINE GLUCONATE CLOTH 2 % EX PADS: 6.0000 | MEDICATED_PAD | Freq: Every day | CUTANEOUS | Status: DC

## 2020-11-14 MED ORDER — PANTOPRAZOLE SODIUM 40 MG PO TBEC: 40.0000 mg | DELAYED_RELEASE_TABLET | Freq: Every day | ORAL | Status: DC

## 2020-11-14 MED ORDER — ONDANSETRON HCL 4 MG/2ML IJ SOLN
4.0000 mg | Freq: Four times a day (QID) | INTRAMUSCULAR | Status: DC | PRN
  Administered 2020-11-20 – 2020-11-30 (×4): 4 mg via INTRAVENOUS
  Filled 2020-11-14 (×7): qty 2

## 2020-11-14 MED ORDER — NEPRO/CARBSTEADY PO LIQD
237.0000 mL | Freq: Two times a day (BID) | ORAL | Status: DC
  Administered 2020-11-16 – 2020-12-09 (×34): 237 mL via ORAL
  Filled 2020-11-14 (×4): qty 237

## 2020-11-14 MED ORDER — PENTAFLUOROPROP-TETRAFLUOROETH EX AERO: 1.0000 "application " | INHALATION_SPRAY | CUTANEOUS | Status: DC | PRN

## 2020-11-14 MED ORDER — DIPHENHYDRAMINE HCL 12.5 MG/5ML PO ELIX: 12.5000 mg | ORAL_SOLUTION | Freq: Four times a day (QID) | ORAL | Status: DC | PRN

## 2020-11-14 MED ORDER — PROSOURCE PLUS PO LIQD
30.0000 mL | Freq: Two times a day (BID) | ORAL | Status: DC
  Administered 2020-11-15 – 2020-12-09 (×37): 30 mL via ORAL
  Filled 2020-11-14 (×30): qty 30

## 2020-11-14 MED ORDER — ACETAMINOPHEN 325 MG PO TABS
325.0000 mg | ORAL_TABLET | ORAL | Status: DC | PRN
  Administered 2020-11-21 – 2020-11-25 (×2): 650 mg via ORAL
  Filled 2020-11-14 (×3): qty 2

## 2020-11-14 MED ORDER — DARBEPOETIN ALFA 100 MCG/0.5ML IJ SOSY: 100.0000 ug | PREFILLED_SYRINGE | INTRAMUSCULAR | Status: DC

## 2020-11-14 MED ORDER — INSULIN ASPART 100 UNIT/ML ~~LOC~~ SOLN: 0.0000 [IU] | Freq: Every day | SUBCUTANEOUS | Status: DC

## 2020-11-14 MED ORDER — HEPARIN SODIUM (PORCINE) 1000 UNIT/ML DIALYSIS
1000.0000 [IU] | INTRAMUSCULAR | Status: DC | PRN
  Administered 2020-11-19: 2800 [IU] via INTRAVENOUS_CENTRAL
  Filled 2020-11-14 (×4): qty 1

## 2020-11-14 MED ORDER — MIDODRINE HCL 5 MG PO TABS
5.0000 mg | ORAL_TABLET | ORAL | Status: DC
  Administered 2020-11-15: 5 mg via ORAL
  Filled 2020-11-14: qty 1

## 2020-11-14 MED ORDER — WHITE PETROLATUM EX OINT: TOPICAL_OINTMENT | CUTANEOUS | Status: DC | PRN

## 2020-11-14 MED ORDER — SENNOSIDES-DOCUSATE SODIUM 8.6-50 MG PO TABS: 2.0000 | ORAL_TABLET | Freq: Every day | ORAL | Status: DC

## 2020-11-14 MED ORDER — TRAZODONE HCL 50 MG PO TABS: 25.0000 mg | ORAL_TABLET | Freq: Every evening | ORAL | Status: DC | PRN

## 2020-11-14 MED ORDER — AMIODARONE HCL 200 MG PO TABS: 200.0000 mg | ORAL_TABLET | Freq: Every day | ORAL | Status: DC

## 2020-11-14 MED ORDER — DEXTROSE 50 % IV SOLN: 0.0000 mL | INTRAVENOUS | Status: DC | PRN

## 2020-11-14 MED ORDER — PENTAFLUOROPROP-TETRAFLUOROETH EX AERO: 1.0000 "application " | INHALATION_SPRAY | CUTANEOUS | 0 refills | Status: DC | PRN

## 2020-11-14 MED ORDER — FLEET ENEMA 7-19 GM/118ML RE ENEM: 1.0000 | ENEMA | Freq: Once | RECTAL | Status: DC | PRN

## 2020-11-14 MED ORDER — MIDODRINE HCL 5 MG PO TABS: 5.0000 mg | ORAL_TABLET | ORAL | Status: DC

## 2020-11-14 MED ORDER — RENA-VITE PO TABS
1.0000 | ORAL_TABLET | Freq: Every day | ORAL | Status: DC
  Administered 2020-11-14 – 2020-12-08 (×25): 1 via ORAL
  Filled 2020-11-14 (×26): qty 1

## 2020-11-14 MED ORDER — INSULIN ASPART 100 UNIT/ML ~~LOC~~ SOLN
0.0000 [IU] | Freq: Three times a day (TID) | SUBCUTANEOUS | Status: DC
  Administered 2020-11-15 – 2020-11-16 (×3): 2 [IU] via SUBCUTANEOUS
  Administered 2020-11-17 – 2020-11-18 (×2): 3 [IU] via SUBCUTANEOUS
  Administered 2020-11-18 – 2020-11-19 (×2): 2 [IU] via SUBCUTANEOUS
  Administered 2020-11-21: 3 [IU] via SUBCUTANEOUS
  Administered 2020-11-21: 2 [IU] via SUBCUTANEOUS
  Administered 2020-11-23: 3 [IU] via SUBCUTANEOUS
  Administered 2020-11-25 – 2020-11-29 (×7): 2 [IU] via SUBCUTANEOUS
  Administered 2020-11-30 – 2020-12-02 (×3): 3 [IU] via SUBCUTANEOUS
  Administered 2020-12-02: 5 [IU] via SUBCUTANEOUS
  Administered 2020-12-03 – 2020-12-04 (×2): 2 [IU] via SUBCUTANEOUS
  Administered 2020-12-05 (×3): 3 [IU] via SUBCUTANEOUS
  Administered 2020-12-06 – 2020-12-07 (×3): 2 [IU] via SUBCUTANEOUS
  Administered 2020-12-08: 3 [IU] via SUBCUTANEOUS

## 2020-11-14 MED ORDER — CHLORHEXIDINE GLUCONATE CLOTH 2 % EX PADS
6.0000 | MEDICATED_PAD | Freq: Every day | CUTANEOUS | Status: DC
  Administered 2020-11-15 – 2020-11-21 (×7): 6 via TOPICAL

## 2020-11-14 MED ORDER — ALUM & MAG HYDROXIDE-SIMETH 200-200-20 MG/5ML PO SUSP: 30.0000 mL | ORAL | Status: DC | PRN

## 2020-11-14 MED ORDER — ACETAMINOPHEN 500 MG PO TABS: 1000.0000 mg | ORAL_TABLET | Freq: Four times a day (QID) | ORAL | 0 refills | Status: DC | PRN

## 2020-11-14 MED ORDER — OXYCODONE HCL 5 MG PO TABS: 5.0000 mg | ORAL_TABLET | ORAL | 0 refills | Status: DC | PRN

## 2020-11-14 MED ORDER — HEPARIN (PORCINE) 25000 UT/250ML-% IV SOLN
1100.0000 [IU]/h | INTRAVENOUS | Status: DC
  Administered 2020-11-15: 1000 [IU]/h via INTRAVENOUS
  Administered 2020-11-16: 1100 [IU]/h via INTRAVENOUS
  Filled 2020-11-14 (×3): qty 250

## 2020-11-14 MED ORDER — NEPRO/CARBSTEADY PO LIQD: 237.0000 mL | Freq: Two times a day (BID) | ORAL | 0 refills | Status: DC

## 2020-11-14 MED ORDER — LIDOCAINE-PRILOCAINE 2.5-2.5 % EX CREA
1.0000 "application " | TOPICAL_CREAM | CUTANEOUS | Status: DC | PRN
  Filled 2020-11-14: qty 5

## 2020-11-14 MED ORDER — ALTEPLASE 2 MG IJ SOLR
2.0000 mg | Freq: Once | INTRAMUSCULAR | Status: DC | PRN
  Filled 2020-11-14: qty 2

## 2020-11-14 MED ORDER — GUAIFENESIN-DM 100-10 MG/5ML PO SYRP
5.0000 mL | ORAL_SOLUTION | Freq: Four times a day (QID) | ORAL | Status: DC | PRN
  Administered 2020-11-26: 10 mL via ORAL
  Filled 2020-11-14: qty 10

## 2020-11-14 MED ORDER — OXYCODONE HCL 5 MG PO TABS
5.0000 mg | ORAL_TABLET | ORAL | Status: DC | PRN
  Administered 2020-11-14 – 2020-12-01 (×22): 10 mg via ORAL
  Administered 2020-12-02: 5 mg via ORAL
  Administered 2020-12-03 – 2020-12-06 (×4): 10 mg via ORAL
  Filled 2020-11-14 (×5): qty 2
  Filled 2020-11-14: qty 1
  Filled 2020-11-14 (×13): qty 2
  Filled 2020-11-14: qty 1
  Filled 2020-11-14 (×7): qty 2
  Filled 2020-11-14: qty 1

## 2020-11-14 MED ORDER — GABAPENTIN 100 MG PO CAPS
100.0000 mg | ORAL_CAPSULE | Freq: Every day | ORAL | Status: DC
  Administered 2020-11-14 – 2020-12-08 (×25): 100 mg via ORAL
  Filled 2020-11-14 (×26): qty 1

## 2020-11-14 MED ORDER — ONDANSETRON HCL 4 MG/2ML IJ SOLN: 4.0000 mg | Freq: Four times a day (QID) | INTRAMUSCULAR | 0 refills | Status: DC | PRN

## 2020-11-14 MED ORDER — PANTOPRAZOLE SODIUM 40 MG PO TBEC
40.0000 mg | DELAYED_RELEASE_TABLET | Freq: Every day | ORAL | Status: DC
  Administered 2020-11-15 – 2020-12-09 (×25): 40 mg via ORAL
  Filled 2020-11-14 (×25): qty 1

## 2020-11-14 MED ORDER — METOCLOPRAMIDE HCL 5 MG PO TABS
5.0000 mg | ORAL_TABLET | Freq: Three times a day (TID) | ORAL | Status: DC
  Administered 2020-11-14 – 2020-11-17 (×8): 5 mg via ORAL
  Filled 2020-11-14 (×8): qty 1

## 2020-11-14 MED ORDER — SENNOSIDES-DOCUSATE SODIUM 8.6-50 MG PO TABS
2.0000 | ORAL_TABLET | Freq: Two times a day (BID) | ORAL | Status: DC
  Administered 2020-11-15 – 2020-12-08 (×34): 2 via ORAL
  Filled 2020-11-14 (×47): qty 2

## 2020-11-14 MED ORDER — AMIODARONE HCL 200 MG PO TABS
200.0000 mg | ORAL_TABLET | Freq: Every day | ORAL | Status: DC
  Administered 2020-11-15 – 2020-11-29 (×15): 200 mg via ORAL
  Filled 2020-11-14 (×15): qty 1

## 2020-11-14 NOTE — Discharge Summary (Signed)
Advanced Heart Failure Team  Discharge Summary   Patient ID: Marcus Johnson. MRN: [id number], DOB/AGE: 60-Apr-1962 60 y.o. Admit date: 09/28/2020 D/C date:     11/14/2020   Primary Discharge Diagnoses:  1.Acute on chronic systolic HF -> cardiogenic shock:ICM. 2. S/P HMIII LVAD 10/04/20 3. CAD s/p CABG2002 4. AKI on CKD Stage 3b-->ESRD  5. Atrial flutter/fibrillation: s/p DC-CV 08/21/18. Maintaining NSR.     6. Wound R Foot 7. Right arm pain 8. ID 9. DM 10. Anemia  11. Deconditioning 12. Unstageable Pressure Ulcer on buttocks: 13. NSVT: Rare.  Hospital Course:   Moxon Messler. is a 60 y.o. male who has a h/o DM2, CAD s/p CABG 7253, systolic HF due to ischemic cardiomyopathy with EF 20-25% (echo 12/15), DM2 and CKD. He is s/p [employer] single chamber ICD.  Admitted marked A/C Systolic Heart and marked volume overload. Had low output heart failure and was placed on inotropes with little improvement. CT surgery consulted and placed Impella 5.5 on 3/1. VAD work up initiated and once optimized he underwent HMIII LVAD and removal of impella. Had R arm pain after impella removal which was felt to be from stretched nerve.   Post op course complicated by AKI resulting dialysis. Pressors gradually weaned off. Nephrology consulted and started CVVHD and later transitioned to [facility]. Tunneled HD cath placed by radiology. Renal Navigator following.   VAD coordinators consulted and followed closely for ongoing education. Ramp ECHO completed prior to d/c to optimize spee.   PT/OT following for deconditioning and right arm weakness.  CIR consulted and deemed appropriate for admit.   The VAD Team will continue to follow daily on CIR. PICC line will remain in place. Plan to continue to follow LDH/INR   See below for detailed problem list.    1.Acute on chronic systolic HF -> cardiogenic shock:ICM.HasMedtronic ICD. EF 15%. Low output persisted despite milrinone and then  dobutamine with rise in creatinine to 2.5. Impella 5.5 placed 3/1.  Creatinine improved to 1.7 and HM3 LVAD was placed on 3/10. Developed post-op renal failure requiring CVVH. Now off milrinone and epinephrine. Last echo showed moderate RV enlargement with mildly decreased RV function and VAD speed decreased to 5300 due to left-sided septum. CRRT stopped 3/23 but restarted 3/24. CVVH stopped again on 3/27. Ramp echo 3/30 with speed decreased to 5200. Tolerating iHD for volume management.  MAPs 80s.  Suspect co-ox this morning is not accurate.  - Cut back midodrine to 5 tid on HD days.   - Off sildenafil with orthostasis.  - Continue compression stockings.  - HD  volume management, next run tomorrow.   - c/w Reglan and zofran for nausea. Off scopolamine patch due to dizziness. Symptoms improved  2. LVAD: 3/13 VAD speed increased to 5500. 3/23 speed decreased to 5300 and decreased to 5200 on 3/30. VAD interrogated personally. LDH 199, INR 1.4  - On warfarin, on ASA 81 daily.  - Restart heparin gtt until INR back up to 1.8.   - DL site ok 3. CAD s/p CABG2002:Last cath in 6/19 with patent grafts. - Continue Crestor 40 mg daily.  4. AKI on CKD Stage 3b: Suspect that this is a combination of cardiorenal syndrome and diabetic nephropathy and possibly ATN. Developed post-op AKI. Started on CRRT 3/15. CVVH stopped 3/23 but restarted 3/24. Now getting iHD, looks like he will be HD-dependent.  Has tunneled catheter.  - HD T/Th/S 5. Atrial flutter/fibrillation: s/p DC-CV 08/21/18. Maintaining NSR.     -  Continue amio 200 mg daily.   6. Wound R Foot: Partial thickness skin loss noted. Cover with mepilex border and change every 5 days. Resolved.  7. Right arm pain: Likely neuropathic from stretch of brachial plexus with Impella placement.  8. ID:  WBCs normal. Afebrile. Blood Cx- NGTD.  9. JJ:KKXFGHW of very poor control but hgbA1ctrending down. Most recent 7.7%.  Insulin drip stopped 3/24. Blood glucose  controlled on reduced Lantus and  sliding scale insulin to moderate.   - Diabetes coordinators have seen 10. Anemia: Has been stable.  11. Deconditioning - PT/OT following, continue aggressive PT/OT efforts - CIR consulted, should be ready to go to CIR when there is a bed.  12. Unstageable Pressure Ulcer on buttocks: Improving.  - On hydrotherapy Mon-Sat.  - Continue reposition every 2 hours side lying position. Reposition frequently in the chair. 13. NSVT: Rare.  LVAD Interrogation HM III:   Speed:  5200   Flow:   4.4   PI:  3.6    Power: 4       Back-up speed:   5200     Discharge Vitals: Blood pressure 104/81, pulse 88, temperature 97.6 F (36.4 C), temperature source Oral, resp. rate 20, height 5' 10.5" (1.791 m), weight 83.3 kg, SpO2 94 %.  Labs: Lab Results  Component Value Date   WBC 10.0 11/14/2020   HGB 8.8 (L) 11/14/2020   HCT 29.2 (L) 11/14/2020   MCV 90.4 11/14/2020   PLT 248 11/14/2020    Recent Labs  Lab 11/14/20 0537  NA 135  K 5.5*  CL 98  CO2 27  BUN 53*  CREATININE 6.63*  CALCIUM 9.5  GLUCOSE 117*   Lab Results  Component Value Date   CHOL 103 09/30/2020   HDL 29 (L) 09/30/2020   LDLCALC 59 09/30/2020   TRIG 76 09/30/2020   BNP (last 3 results) Recent Labs    10/27/20 0020 11/02/20 2310 11/10/20 0150  BNP 774.9* 486.0* 905.1*    ProBNP (last 3 results) No results for input(s): PROBNP in the last 8760 hours.   Diagnostic Studies/Procedures   No results found.  Discharge Medications   Allergies as of 11/14/2020      Reactions   Meclizine Hcl Anaphylaxis, Swelling   Ivabradine Nausea Only      Medication List    STOP taking these medications   Accu-Chek Guide test strip Generic drug: glucose blood   Accu-Chek Guide w/Device Kit   Accu-Chek Softclix Lancets lancets   allopurinol 300 MG tablet Commonly known as: ZYLOPRIM   BD Pen Needle Nano U/F 32G X 4 MM Misc Generic drug: Insulin Pen Needle   blood glucose meter kit  and supplies Kit   carvedilol 12.5 MG tablet Commonly known as: COREG   digoxin 0.125 MG tablet Commonly known as: LANOXIN   Eliquis 5 MG Tabs tablet Generic drug: apixaban   empagliflozin 10 MG Tabs tablet Commonly known as: Jardiance   Entresto 97-103 MG Generic drug: sacubitril-valsartan   Klor-Con M20 20 MEQ tablet Generic drug: potassium chloride SA   Lantus SoloStar 100 UNIT/ML Solostar Pen Generic drug: insulin glargine Replaced by: insulin glargine 100 UNIT/ML injection   metolazone 2.5 MG tablet Commonly known as: ZAROXOLYN   NovoLOG FlexPen 100 UNIT/ML FlexPen Generic drug: insulin aspart Replaced by: insulin aspart 100 UNIT/ML injection   ondansetron 4 MG disintegrating tablet Commonly known as: Zofran ODT   spironolactone 25 MG tablet Commonly known as: ALDACTONE   torsemide 20 MG  tablet Commonly known as: DEMADEX     TAKE these medications   (feeding supplement) PROSource Plus liquid Take 30 mLs by mouth 2 (two) times daily between meals.   feeding supplement (NEPRO CARB STEADY) Liqd Take 237 mLs by mouth 2 (two) times daily between meals.   acetaminophen 500 MG tablet Commonly known as: TYLENOL Take 2 tablets (1,000 mg total) by mouth every 6 (six) hours as needed for mild pain.   amiodarone 200 MG tablet Commonly known as: PACERONE Take 1 tablet (200 mg total) by mouth daily. Start taking on: November 15, 2020   Darbepoetin Alfa 100 MCG/0.5ML Sosy injection Commonly known as: ARANESP Inject 0.5 mLs (100 mcg total) into the vein every Tuesday with hemodialysis. Start taking on: November 15, 2020   dextrose 50 % solution Inject 0-50 mLs into the vein as needed for low blood sugar.   ferric gluconate 125 mg in sodium chloride 0.9 % 100 mL Inject 125 mg into the vein Every Tuesday,Thursday,and Saturday with dialysis. Start taking on: November 15, 2020   gabapentin 100 MG capsule Commonly known as: NEURONTIN Take 1 capsule (100 mg total) by  mouth at bedtime.   heparin 1000 unit/mL Soln injection 1 mL (1,000 Units total) by Dialysis route as needed (in dialysis).   heparin 1000 unit/mL Soln injection 1-6 mLs (1,000-6,000 Units total) by CRRT route as needed (Use to fill CRRT catheter with heparin 1000 units/mL per catheter volume.).   heparin 25000 UT/250ML infusion Inject 800 Units/hr into the vein continuous.   insulin aspart 100 UNIT/ML injection Commonly known as: novoLOG Inject 0-5 Units into the skin at bedtime. Replaces: NovoLOG FlexPen 100 UNIT/ML FlexPen   insulin glargine 100 UNIT/ML injection Commonly known as: LANTUS Inject 0.15 mLs (15 Units total) into the skin daily. Start taking on: November 15, 2020 Replaces: Lantus SoloStar 100 UNIT/ML Solostar Pen   lidocaine (PF) 1 % Soln injection Commonly known as: XYLOCAINE Inject 5 mLs into the skin as needed (topical anesthesia for hemodialysis ifGEBAUERS is ineffective.).   lidocaine-prilocaine cream Commonly known as: EMLA Apply 1 application topically as needed (topical anesthesia for hemodialysis if Gebauers and Lidocaine injection are ineffective.).   metoCLOPramide 5 MG tablet Commonly known as: REGLAN Take 1 tablet (5 mg total) by mouth 3 (three) times daily before meals.   midodrine 5 MG tablet Commonly known as: PROAMATINE Take 1 tablet (5 mg total) by mouth 3 (three) times daily with meals.   multivitamin Tabs tablet Take 1 tablet by mouth at bedtime.   ondansetron 4 MG/2ML Soln injection Commonly known as: ZOFRAN Inject 2 mLs (4 mg total) into the vein every 6 (six) hours as needed for nausea or vomiting.   oxyCODONE 5 MG immediate release tablet Commonly known as: Oxy IR/ROXICODONE Take 1-2 tablets (5-10 mg total) by mouth every 3 (three) hours as needed for moderate pain.   pantoprazole 40 MG tablet Commonly known as: PROTONIX Take 1 tablet (40 mg total) by mouth daily. Start taking on: November 15, 2020    pentafluoroprop-tetrafluoroeth Aero Commonly known as: GEBAUERS Apply 1 application topically as needed (topical anesthesia for hemodialysis).   rosuvastatin 20 MG tablet Commonly known as: CRESTOR Take 1 tablet (20 mg total) by mouth daily. Start taking on: November 15, 2020 What changed:   medication strength  how much to take   senna-docusate 8.6-50 MG tablet Commonly known as: Senokot-S Take 2 tablets by mouth at bedtime.   senna-docusate 8.6-50 MG tablet Commonly known as: Senokot-S  Take 2 tablets by mouth at bedtime as needed for mild constipation.   sodium chloride 0.9 % infusion Inject 100 mLs into the vein as needed (symptomatic hypotension or decrease in SBP < 90 mmHg).   sodium chloride 0.9 % infusion Inject 100 mLs into the vein as needed (severe cramping).   sodium chloride flush 0.9 % Soln Commonly known as: NS 10-40 mLs by Intracatheter route every 12 (twelve) hours.   sodium chloride flush 0.9 % Soln Commonly known as: NS 10-40 mLs by Intracatheter route as needed (flush).   sorbitol 70 % Soln Take 30 mLs by mouth once for 1 dose.   traZODone 50 MG tablet Commonly known as: DESYREL Take 1 tablet (50 mg total) by mouth at bedtime as needed for sleep.   warfarin 4 MG tablet Commonly known as: COUMADIN Take 1 tablet (4 mg total) by mouth one time only at 4 PM.       Disposition   The patient will be discharged in stable condition to home. Discharge Instructions    Amb Referral to Cardiac Rehabilitation   Complete by: As directed    Diagnosis:  Other Heart Failure (see criteria below if ordering Phase II)     Heart Failure Type: Chronic Systolic Comment - LVAD   After initial evaluation and assessments completed: Virtual Based Care may be provided alone or in conjunction with Phase 2 Cardiac Rehab based on patient barriers.: Yes   Diet - low sodium heart healthy   Complete by: As directed    Diet - low sodium heart healthy   Complete by: As  directed    Heart Failure patients record your daily weight using the same scale at the same time of day   Complete by: As directed    INR  Goal: 2 - 2.5   Complete by: As directed    Goal: 2 - 2.5   Increase activity slowly   Complete by: As directed    Increase activity slowly   Complete by: As directed    Page VAD Coordinator at [phone]  Notify for: any VAD alarms, sustained elevations of power >10 watts, sustained drop in Pulse Index <3   Complete by: As directed    Notify for:  any VAD alarms sustained elevations of power >10 watts sustained drop in Pulse Index <3     Page VAD Coordinator at [phone]  Notify for: any VAD alarms, sustained elevations of power >10 watts, sustained drop in Pulse Index <3   Complete by: As directed    Notify for:  any VAD alarms sustained elevations of power >10 watts sustained drop in Pulse Index <3     Speed Settings:   Complete by: As directed    Fixed 5200RPM Low 4900 RPM   Speed Settings:   Complete by: As directed    Fixed 5200 RPM Low 4900 RPM         Duration of Discharge Encounter: Greater than 35 minutes   Signed, Antawn Sison NP-C  11/14/2020, 2:24 PM

## 2020-11-14 NOTE — Progress Notes (Addendum)
LVAD Coordinator Rounding Note:  Admitted 09/28/20 due to worsening heart failure symptoms. Recent COVID infection January 2022; did not take medications for 3-4 weeks.   S/p dental extractions 10/07/20 per Dr Benson Norway.  HM III LVAD implanted on 10/13/20 by Dr Cyndia Bent under Destination Therapy criteria.  Pt lying in bed with son and brother at bedside. VAD discharge teaching is being completed by Emerson Monte, [place] Coordinator.   Pt has HD dialysis scheduled on Tues/Thurs/Sat. Planned transfer to CIR later today.   Vital signs: Temp: 97.6 HR: 88 Doppler: 85 Auto cuff: 94/82 (87) O2 Sat: 94% on RA Wt: 200.1>221.7>219.5.Marland KitchenMarland Kitchen>185.8>181>179.9>190.2>185.4>187.6>185.6>183.6 lbs    LVAD interrogation reveals:  Speed: 5200 Flow: 4.1 Power: 3.8w PI: 4.4 Hematocrit: 29  Alarms: none Events: 24 PI events on 11/13/20  Fixed speed: 5200 Low speed limit: 4900  Drive Line: Existing VAD dressing C/D/I with anchor intact and accurately applied. Planned dressing change today per Emerson Monte, VAD Coordinator along with patient/family discharge teaching.  Continue Monday, Wednesday, Friday dressing changes by VAD coordinator or nurse champion. Next dressing change will be due 11/16/20.   Labs:  LDH trend: 444>670>575>571.....>246>244>245>220>209>198>226>209>237>199  INR trend: 1.4>1.7>1.8>1.7>1.6>1.8..........2.8>2.6>3.0>3.1>2.9>2.0>1.4  Creatinine: 1.84>3.0>3.4>2.73.....2.68>3.11>6.37>4.32>5.8>6.75>4.8>6.09>4.78>5.5  Anticoagulation Plan: -INR Goal: 2.0-2.5 -ASA Dose: 81 mg  Blood Products:  IntraOp:   1 Platelet  6 FFP  3 PRBC  1035 cell saver Postop:   10/13/20:   4 FFP  4 PRBC    10/14/20:  1 FFP  1 PRBC   10/16/20:  1 PRBC   10/18/20:  1 PRBC   10/16/20:  1 PRBC   10/18/20:  1 PRBC   10/27/20:  1 PRBC  Device: notified Ronalee Belts from [employer] to turn pts ICD back on 11/09/20 -  [employer] single -  Pacing: VVI 40 - Therapies: on 200 bpm  Arrythmias: VT in OR requiring  defibrillation   Renal: CRRT   - started 10/19/20   - stopped 10/26/20   - re-started 10/27/20  - stopped 10/30/20   iHD started 10/31/20    Patient Education: 1. Discharge teaching today per Emerson Monte, [place] Coordinator; see her note.   Plan/Recommendations:  1. Call VAD coordinator for equipment or drive line issues 2. MWF drive line dressing change per VAD coordinator or nurse champion. Next dressing change due 11/16/20. 3. Transfer to inpatient Rehab later today.  Orlando Penner RN VAD Coordinator  Office: [phone]  24/7 Pager: [phone]

## 2020-11-14 NOTE — Progress Notes (Signed)
Inpatient Rehabilitation Medication Review by a Pharmacist  A complete drug regimen review was completed for this patient to identify any potential clinically significant medication issues.  Clinically significant medication issues were identified:  no  Check AMION for pharmacist assigned to patient if future medication questions/issues arise during this admission.  Pharmacist comments:   Time spent performing this drug regimen review (minutes):  10   Einar Grad 11/14/2020 4:15 PM

## 2020-11-14 NOTE — Progress Notes (Signed)
PMR Admission Coordinator Pre-Admission Assessment   Patient: Marcus Johnson. is an 60 y.o., male MRN: [id number] DOB: January 05, 1961 Height: 5' 10.5" (179.1 cm) Weight: 83.3 kg   Insurance Information HMO: Yes    PPO:       PCP:       IPA:       80/20:       OTHER:  Group [id number] PRIMARY: [employer]      Policy#: [id number]      Subscriber: patient CM Name: Marcus Johnson      Phone#: [phone]     Fax#: [phone] Pre-Cert#: [id number] [place] for CIR given by Marcus Johnson for admit 4/11 with updates due 4/18 to fax listed above   Employer: Not working, disabled Benefits:  Phone #: [phone]     Name:  Eff. Date: 06/06/2020     Deduct: $0      Out of Pocket Max: $3900      Life Max: N/A CIR: $295/day days 1-6      SNF: $0 days 1-20, $188 days 21-41, $0 days 42-100 Outpatient: medical necessity     Co-Pay: $10/visit Home Health: 100%      Co-Pay: none DME: 80%     Co-Pay: 20% Providers: in network  SECONDARY:        Policy#:       Phone#:     Development worker, community:       Phone#:    The Engineer, petroleum" for patients in Inpatient Rehabilitation Facilities with attached "Privacy Act [place] Records" was provided and verbally reviewed with: Patient   Emergency Contact Information         Contact Information     Name Relation Home Work Mobile    Johnson,Marcus Son [phone]   [phone]    Marcus Johnson, Marcus Johnson Brother     [phone]         Current Medical History  Patient Admitting Diagnosis: Debility post LVAD   History of Present Illness: A 60 year old gentleman with a history of diabetes with complications of diabetic retinopathy and peripheral neuropathy, stage III chronic kidney disease, atrial fibrillation on Eliquis, and chronic systolic heart failure status post coronary bypass graft surgery in 2002 by Dr. Darcey Nora.  He has been followed by Dr. Aundra Dubin for several years and had a CPX 05/09/2018 showing severe functional limitation due to heart  failure.  His ejection fraction at that time is 20% with moderately decreased RV systolic function.  He has required milrinone multiple times in the past for low output heart failure and discussions were held about proceeding with LVAD/transplant therapy but he has always been resistant.  He developed COVID-19 infection in January 2022 with predominantly GI symptoms with nausea and vomiting for several weeks and was unable to take his heart failure medicines.  He had some dehydration with worsening renal function.  His nausea and vomiting improved and he started his medications again but his weight increased 16 pounds and he continued to have some lightheadedness and worsening shortness of breath and orthopnea.  He was admitted and started on milrinone and IV diuretic.  He continued to have a low Co-ox and a rise in his creatinine.  His creatinine today was up to 2.5 from his admission creatinine of 1.8.  His milrinone was increased to 0.375 on 226 and then yesterday was switched to dobutamine 5 mcg.  His Co-ox today is 57.5%.  His last right heart cath in June 2019 showed patent grafts and elevated filling pressures  with low cardiac output.  His most recent echocardiogram on 09/29/2020 showed an ejection fraction of 20 to 25% with global hypokinesis.  His LV internal dimension was 5.6 cm.  There was mild mitral regurgitation and no aortic regurgitation.  There was severe tricuspid regurgitation.  Right ventricular systolic function was felt to be normal with severely elevated pulmonary artery systolic pressure.  The patient is single and lives alone.  He has a son and a brother who could be his primary caregivers.  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  He is retired on disability.  He is a non-smoker and denies alcohol or drug use. Impella placed 3/1. Heartmate 3 LVAD placed 3/10. CRRT started 3/15. CRRT held 3/23-3/24 then restarted until 3/27 with iHD  started 3/28.  He was evaluated by PT and OT with recommendations for an inpatient rehab admission.     Patient's medical [facility] from [facility] has been reviewed by the rehabilitation admission coordinator and physician.   Past Medical History      Past Medical History:  Diagnosis Date  . AICD (automatic cardioverter/defibrillator) present      Single-chamber  implantable cardiac defibrillator - Medtronic  . Atrial fibrillation ([place])    . Cataract      Mixed form OU  . CHF (congestive heart failure) ([place])    . Chronic kidney disease    . Chronic kidney disease (CKD), stage III (moderate) (HCC)    . Chronic systolic heart failure ([place])      a. Echo 5/13: Mild LVE, mild LVH, EF 10%, anteroseptal, lateral, apical AK, mild MR, mild LAE, moderately reduced RVSF, mild RAE, PASP 60;  b. 07/2014 Echo: EF 20-2%, diff HK, AKI of antsep/apical/mid-apicalinferior, mod reduced RV.  Marland Kitchen Coronary artery disease      a. s/p CABG 2002 b. LHC 5/13:  dLM 80%, LAD subtotally occluded, pCFX occluded, pRCA 50%, mid? Occlusion with high take off of the PDA with 70% multiple lesions-not bypassed and supplies collaterals to LAD, LIMA-IM/ramus ok, S-OM ok, S-PLA branches ok. Medical therapy was recommended  . COVID    . Diabetic retinopathy (HCC)      NPDR OD, PDR OS  . Dyspnea    . Gout      "on daily RX" (01/08/2018)  . Hypertension    . Hypertensive retinopathy      OU  . Ischemic cardiomyopathy      a. Echo 5/13: Mild LVE, mild LVH, EF 10%, anteroseptal, lateral, apical AK, mild MR, mild LAE, moderately reduced RVSF, mild RAE, PASP 60;  b. 01/2012 s/p MDT [id number] Protecta XT VR AICD;  c. 07/2014 Echo: EF 20-2%, diff HK, AKI of antsep/apical/mid-apicalinferior, mod reduced RV.  Marland Kitchen MRSA (methicillin resistant Staphylococcus aureus)      Status post right foot plantar deep infection with MRSA status post  I&D 10/2008  . Myocardial infarction [facility])      "was told I'd had several before heart OR in 2002"  (01/08/2018)  . Noncompliance    . Peripheral neuropathy    . Retinopathy, diabetic, background ([place])    . Syncope    . Type II diabetes mellitus ([place])      requiring insulin   . Vitreous hemorrhage, left (HCC)      and proliferative diabetic retinopathy      Family History   family history includes Diabetes in his father; Hypertension in his mother.   Prior Rehab/Hospitalizations Has the  patient had prior rehab or hospitalizations prior to admission? Yes   Has the patient had major surgery during 100 days prior to admission? Yes              Current Medications   Current Facility-Administered Medications:  .  (feeding supplement) PROSource Plus liquid 30 mL, 30 mL, Oral, BID BM, Clegg, Amy D, NP, 30 mL at 11/14/20 0800 .  0.9 %  sodium chloride infusion, 100 mL, Intravenous, PRN, Clegg, Amy D, NP .  0.9 %  sodium chloride infusion, 100 mL, Intravenous, PRN, Clegg, Amy D, NP .  acetaminophen (TYLENOL) tablet 1,000 mg, 1,000 mg, Oral, Q6H PRN, Clegg, Amy D, NP, 1,000 mg at 11/03/20 1003 .  alteplase (CATHFLO ACTIVASE) injection 2 mg, 2 mg, Intracatheter, Once PRN, Clegg, Amy D, NP .  amiodarone (PACERONE) tablet 200 mg, 200 mg, Oral, Daily, Larey Dresser, MD, 200 mg at 11/14/20 0801 .  aspirin EC tablet 81 mg, 81 mg, Oral, Daily, Clegg, Amy D, NP, 81 mg at 11/14/20 0801 .  Chlorhexidine Gluconate Cloth 2 % PADS 6 each, 6 each, Topical, Daily, Clegg, Amy D, NP, 6 each at 11/12/20 1700 .  Chlorhexidine Gluconate Cloth 2 % PADS 6 each, 6 each, Topical, Q0600, Clegg, Amy D, NP, 6 each at 11/14/20 0456 .  collagenase (SANTYL) ointment, , Topical, Daily, Gaye Pollack, MD, Given at 11/14/20 0802 .  [START ON 11/15/2020] Darbepoetin Alfa (ARANESP) injection 100 mcg, 100 mcg, Intravenous, Q Tue-HD, Madelon Lips, MD .  dextrose 50 % solution 0-50 mL, 0-50 mL, Intravenous, PRN, Clegg, Amy D, NP, 50 mL at 10/17/20 0420 .  feeding supplement (NEPRO CARB STEADY) liquid 237 mL, 237 mL, Oral,  BID BM, Gaye Pollack, MD, 237 mL at 11/14/20 0802 .  fentaNYL (SUBLIMAZE) injection 12.5-25 mcg, 12.5-25 mcg, Intravenous, Q4H PRN, Consuelo Pandy, PA-C, 12.5 mcg at 11/11/20 1103 .  ferric gluconate (NULECIT) 125 mg in sodium chloride 0.9 % 100 mL IVPB, 125 mg, Intravenous, Q T,Th,Sa-HD, Justin Mend, MD, Stopped at 11/10/20 1826 .  gabapentin (NEURONTIN) capsule 100 mg, 100 mg, Oral, QHS, Clegg, Amy D, NP, 100 mg at 11/13/20 2236 .  heparin injection 1,000 Units, 1,000 Units, Dialysis, PRN, Clegg, Amy D, NP, 1,000 Units at 11/10/20 1700 .  heparin injection 1,000-6,000 Units, 1,000-6,000 Units, CRRT, PRN, Clegg, Amy D, NP .  insulin aspart (novoLOG) injection 0-15 Units, 0-15 Units, Subcutaneous, TID WC, Larey Dresser, MD, 2 Units at 11/13/20 1740 .  insulin aspart (novoLOG) injection 0-5 Units, 0-5 Units, Subcutaneous, QHS, Clegg, Amy D, NP, 2 Units at 11/02/20 2156 .  insulin glargine (LANTUS) injection 15 Units, 15 Units, Subcutaneous, Daily, Larey Dresser, MD, 15 Units at 11/14/20 0801 .  lactated ringers infusion, , Intravenous, Continuous, Clegg, Amy D, NP, Stopped at 10/17/20 1655 .  lidocaine (PF) (XYLOCAINE) 1 % injection 5 mL, 5 mL, Intradermal, PRN, Clegg, Amy D, NP .  lidocaine-prilocaine (EMLA) cream 1 application, 1 application, Topical, PRN, Clegg, Amy D, NP .  metoCLOPramide (REGLAN) tablet 5 mg, 5 mg, Oral, TID AC, Larey Dresser, MD, 5 mg at 11/14/20 5573 .  [START ON 11/15/2020] midodrine (PROAMATINE) tablet 5 mg, 5 mg, Oral, 3 times per day on Tue Thu Sat, McLean, Dalton S, MD .  multivitamin (RENA-VIT) tablet 1 tablet, 1 tablet, Oral, QHS, Clegg, Amy D, NP, 1 tablet at 11/13/20 2236 .  ondansetron (ZOFRAN) injection 4 mg, 4 mg, Intravenous, Q6H PRN, Clegg,  Amy D, NP, 4 mg at 11/12/20 0625 .  oxyCODONE (Oxy IR/ROXICODONE) immediate release tablet 5-10 mg, 5-10 mg, Oral, Q3H PRN, Clegg, Amy D, NP, 10 mg at 11/13/20 2235 .  pantoprazole (PROTONIX) EC tablet  40 mg, 40 mg, Oral, Daily, Clegg, Amy D, NP, 40 mg at 11/14/20 0801 .  pentafluoroprop-tetrafluoroeth (GEBAUERS) aerosol 1 application, 1 application, Topical, PRN, Clegg, Amy D, NP .  polyethylene glycol (MIRALAX / GLYCOLAX) packet 17 g, 17 g, Oral, BID, Gaye Pollack, MD, 17 g at 11/14/20 0801 .  rosuvastatin (CRESTOR) tablet 20 mg, 20 mg, Oral, Daily, Larey Dresser, MD, 20 mg at 11/14/20 0801 .  senna-docusate (Senokot-S) tablet 2 tablet, 2 tablet, Oral, QHS PRN, Clegg, Amy D, NP .  senna-docusate (Senokot-S) tablet 2 tablet, 2 tablet, Oral, QHS, Larey Dresser, MD, 2 tablet at 11/10/20 2204 .  sodium chloride flush (NS) 0.9 % injection 10-40 mL, 10-40 mL, Intracatheter, Q12H, Clegg, Amy D, NP, 10 mL at 11/14/20 0802 .  sodium chloride flush (NS) 0.9 % injection 10-40 mL, 10-40 mL, Intracatheter, PRN, Clegg, Amy D, NP, 20 mL at 10/26/20 1132 .  sodium zirconium cyclosilicate (LOKELMA) packet 10 g, 10 g, Oral, Once, Dwana Melena, MD .  traZODone (DESYREL) tablet 50 mg, 50 mg, Oral, QHS PRN, Clegg, Amy D, NP, 50 mg at 11/13/20 2236 .  Warfarin - Pharmacist Dosing Inpatient, , Does not apply, q1600, Clegg, Amy D, NP, Given at 11/13/20 1741 .  white petrolatum (VASELINE) gel, , Topical, PRN, Clegg, Amy D, NP   Patients Current Diet:     Diet Order                      Diet regular Room service appropriate? Yes; Fluid consistency: Thin  Diet effective now                      Precautions / Restrictions Precautions Precautions: Fall,Sternal,Other (comment) Precaution Booklet Issued: Yes (comment) Precaution Comments: LVAD, sacral wound, Left BPPV Other Brace: thumb loop and buddy strap R hand Restrictions Weight Bearing Restrictions: No RUE Weight Bearing: Partial weight bearing RUE Partial Weight Bearing Percentage or Pounds:  (IMPELLA) Other Position/Activity Restrictions: sternal precautions    Has the patient had 2 or more falls or a fall with injury in the past year? No.   Reports one fall off his bed with no injury.   Prior Activity Level Community (5-7x/wk): Went out 3-4 days a week, was driving.   Prior Functional Level Self Care: Did the patient need help bathing, dressing, using the toilet or eating? Independent   Indoor Mobility: Did the patient need assistance with walking from room to room (with or without device)? Independent   Stairs: Did the patient need assistance with internal or external stairs (with or without device)? Independent   Functional Cognition: Did the patient need help planning regular tasks such as shopping or remembering to take medications? Independent   Home Assistive Devices / Equipment Home Assistive Devices/Equipment: None Home Equipment: None   Prior Device Use: Indicate devices/aids used by the patient prior to current illness, exacerbation or injury? None of the above   Current Functional Level Cognition   Overall Cognitive Status: Impaired/Different from baseline Current Attention Level: Selective Orientation Level: Oriented X4 Following Commands: Follows one step commands consistently Safety/Judgement: Decreased awareness of deficits General Comments: pt recalling all sternal precautions, all contents of black bag, able to verbalize sequence for power  transition but needs min cues for accuracy and mod assist due to weakness in RUE    Extremity Assessment (includes Sensation/Coordination)   Upper Extremity Assessment: RUE deficits/detail RUE Deficits / Details: increased edema with really stiff 2nd/3rd digits, cannot close hand fully (2/3); also edema throughout rest of arm RUE Coordination: decreased fine motor,decreased gross motor LUE Deficits / Details: increased edema can fully close hand but not tight--can hold ice pop and work on squeezing it. LUE Coordination: decreased fine motor,decreased gross motor  Lower Extremity Assessment: Defer to PT evaluation (increased difficulty advancing RLE when  ambulating)     ADLs   Overall ADL's : Needs assistance/impaired Eating/Feeding: Supervision/ safety,Set up Eating/Feeding Details (indicate cue type and reason): fed self using R had @ 10 bites of potatoes using red tubing on spoon; thumb loop/buddy strap intact Grooming: Set up,Supervision/safety,Bed level Upper Body Bathing: Set up,Supervision/ safety,Bed level Lower Body Bathing: Moderate assistance,Bed level Lower Body Bathing Details (indicate cue type and reason): A for pericare and lower legs. painful when reaching (sacral pain) Upper Body Dressing : Moderate assistance Upper Body Dressing Details (indicate cue type and reason): to donn battery harness Lower Body Dressing: Maximal assistance,Sit to/from stand Toilet Transfer: Minimal assistance Toilet Transfer Details (indicate cue type and reason): simulated Toileting- Clothing Manipulation and Hygiene: Maximal assistance Functional mobility during ADLs: Minimal assistance (power up to stand) General ADL Comments: focus of session on connecting/disconnecting connector     Mobility   Overal bed mobility: Needs Assistance Bed Mobility: Rolling,Sidelying to Sit Rolling: Min guard Sidelying to sit: Min guard Supine to sit: Min assist Sit to supine: Min assist Sit to sidelying: Mod assist General bed mobility comments: pt able to roll to right and rise from side with only cues and increased time, no spinning with transition     Transfers   Overall transfer level: Needs assistance Equipment used: 2 person hand held assist Transfers: Sit to/from Stand Sit to Stand: Min guard Stand pivot transfers: Mod assist,+2 physical assistance General transfer comment: minguard x 3 trials with good hand placement with guarding to rise. Min assist to sit due to pt with jerking and reports of weakness with need to sit     Ambulation / Gait / Stairs / Wheelchair Mobility   Ambulation/Gait Ambulation/Gait assistance: Min assist,+2  safety/equipment Gait Distance (Feet): 60 Feet Assistive device: 1 person hand held assist Gait Pattern/deviations: Step-through pattern,Decreased stride length General Gait Details: pt walked 60', 70', 60' respectively with close chair follow and limited distances by pt having jerking and leg weakness with need for assist to sit. With each trial PI maintained at  3.4 nd HR 90. Jerking continued grossly 1 min with each sitting trial and then pt able to reattempt gait. Return to room via chair. Gait velocity: functional Gait velocity interpretation: 1.31 - 2.62 ft/sec, indicative of limited community ambulator     Posture / Balance Dynamic Sitting Balance Sitting balance - Comments: EOB without assist with midline posture Balance Overall balance assessment: Needs assistance Sitting-balance support: Feet supported Sitting balance-Leahy Scale: Good Sitting balance - Comments: EOB without assist with midline posture Postural control: Right lateral lean Standing balance support: Single extremity supported Standing balance-Leahy Scale: Poor Standing balance comment: LUE support in standing; HHA     Special needs/care consideration Continuous Drip IV  KVO, Dialysis: Hemodialysis Tuesday, Thursday and Saturday, Oxygen Incentive spirometry, Skin Coccyx wound with dressing, Right chest, abdoman and arm incisions with dressings.  LVAD in place with dressing. and Special  service needs Has an LVAD which is new     Previous Home Environment (from acute therapy documentation) Living Arrangements: Other relatives,Children Available Help at Discharge: Family,Available 24 hours/day Type of Home: Apartment Home Layout: One level Home Access: Stairs to enter Entrance Stairs-Rails: Interior and spatial designer of Steps: 3 flights Bathroom Shower/Tub: Engineer, manufacturing systems: Standard Home Care Services: No   Discharge Living Setting Plans for Discharge Living Setting: Apartment,Lives with  (comment) (Lives with his son and his brother) Type of Home at Discharge: Apartment Discharge Home Layout: One level Discharge Home Access: Stairs to enter Entrance Stairs-Rails: Interior and spatial designer of Steps: 27 stairs to 3rd floor apartment Discharge Bathroom Shower/Tub: Tub/shower unit,Curtain Discharge Bathroom Toilet: Standard Discharge Bathroom Accessibility: Yes How Accessible: Accessible via walker Does the patient have any problems obtaining your medications?: No   Social/Family/Support Systems Patient Roles: Parent,Other (Comment) (Has a son and a brother) Anticipated Caregiver: brother and son Ability/Limitations of Caregiver: Son works varying shifts.  Brother is at home all the time and not working. Caregiver Availability: 24/7 Discharge Plan Discussed with Primary Caregiver: Yes Is Caregiver In Agreement with Plan?: Yes   Goals Patient/Family Goal for Rehab: PT/OT mod I and supervision goals Expected length of stay: 7-10 days Cultural Considerations: None Pt/Family Agrees to Admission and willing to participate: Yes Program Orientation Provided & Reviewed with Pt/Caregiver Including Roles  & Responsibilities: Yes   Decrease burden of Care through IP rehab admission:N/A   Possible need for SNF placement upon discharge: Not anticipated   Patient Condition: I have reviewed medical records from [facility], spoken with CM, and patient. I met with patient at the bedside for inpatient rehabilitation assessment.  Patient will benefit from ongoing PT and OT, can actively participate in 3 hours of therapy a day 5 days of the week, and can make measurable gains during the admission.  Patient will also benefit from the coordinated team approach during an Inpatient Acute Rehabilitation admission.  The patient will receive intensive therapy as well as Rehabilitation physician, nursing, social worker, and care management interventions.  Due to bladder management, bowel  management, safety, skin/wound care, disease management, medication administration, pain management and patient education the patient requires 24 hour a day rehabilitation nursing.  The patient is currently Minimum assistance with mobility and basic ADLs.  Discharge setting and therapy post discharge at home with home health is anticipated.  Patient has agreed to participate in the Acute Inpatient Rehabilitation Program and will admit today.   Preadmission Screen Completed By:  Stephania Fragmin, PT, DPT 11/14/2020 11:19 AM ______________________________________________________________________   Discussed status with Dr. Riley Kill on 11/14/20  at 11:19 AM  and received approval for admission today.   Admission Coordinator:  Stephania Fragmin, PT, DPT time 11:19 AM Dorna Bloom 11/14/20     Assessment/Plan: Diagnosis: debility after LVAD placement 1. Does the need for close, 24 hr/day Medical supervision in concert with the patient's rehab needs make it unreasonable for this patient to be served in a less intensive setting? Yes 2. Co-Morbidities requiring supervision/potential complications: DM, CKDIII, csCHF, sacral wound 3. Due to bladder management, bowel management, safety, skin/wound care, disease management, medication administration, pain management and patient education, does the patient require 24 hr/day rehab nursing? Yes 4. Does the patient require coordinated care of a physician, rehab nurse, PT and OT to address physical and functional deficits in the context of the above medical diagnosis(es)? Yes Addressing deficits in the following areas: balance, endurance, locomotion, strength, transferring, bowel/bladder  control, bathing, dressing, feeding, grooming, toileting and psychosocial support 5. Can the patient actively participate in an intensive therapy program of at least 3 hrs of therapy 5 days a week? Yes 6. The potential for patient to make measurable gains while on inpatient rehab is  excellent 7. Anticipated functional outcomes upon discharge from inpatient rehab: modified independent and supervision PT, modified independent and supervision OT, n/a SLP 8. Estimated rehab length of stay to reach the above functional goals is: 7-10 days 9. Anticipated discharge destination: Home 10. Overall Rehab/Functional Prognosis: excellent     MD Signature: Meredith Staggers, MD, [place] [facility] 11/14/2020

## 2020-11-14 NOTE — Progress Notes (Signed)
Inpatient Rehab Admissions Coordinator:    I have insurance approval and a bed available for pt to admit to CIR today. Dr. Aundra Dubin in agreement.  Will let pt/family and TOC team know.   Shann Medal, PT, DPT Admissions Coordinator [phone] 11/14/20  11:11 AM

## 2020-11-14 NOTE — Progress Notes (Signed)
VAD Discharge Teaching Note:  Discharge VAD teaching completed with Marcus Johnson, his brother Marcus Johnson, and pt's son Marcus Johnson.  The home inspection checklist has been reviewed and no unsafe conditions have been identified. Family reports that there are at least two dedicated grounded, 3-prong outlets with clearly labeled circuit breaker has been established in the bedroom for power module and Charity fundraiser.   Both patient and caregivers have been trained on the following:  1. HM III LVAD overview of system operations  2. Overview of major lifestyle accommodations and cautions   3. Overview of system components (features and functions) 4. Changing power sources 5. Overview of alerts and alarms 6. How to identify and manage an emergency including when pump is running and when pump has stopped  7. Changing system controller 8. Maintain emergency contact list and medications  The patient and caregiver(s) have successfully demonstrated:  1. Changing power source (from batteries to mobile power unit, mobile power unit to batteries, and replacing batteries) 2. Perform system controller self test  3. Check and charge batteries  4. Change system controller 5. Paged VAD pager and programmed number in phones  A daily flow sheet with patient  weight, temperature,  flow, speed, power, and PI, along with daily self checks on system controller and power module have been performed by patient and caregiver(s) during hospitalization and will also be done daily at home.   The following routine activities and maintenance have been reviewed with patient and caregiver(s) and both verbalize understanding:  1. Stressed importance of never disconnecting power from both controller power leads at the same time, and never disconnecting both batteries at the same time, or the pump will stop 2. Plug the mobile power unit (MPU) and the universal battery charger (UBC) into properly grounded (3 prong) outlets dedicated to PM  use. Do NOT use adapter (cheater plug) for ungrounded outlets or multiple portable socket outlets (power strips) 3. Do not connect the PM or MPU to an outlet controlled by wall switch or the device may not work 4. Transfer from MPU to batteries during [facility] mains power failure. The PM has internal backup battery that will power the pump while you transfer to batteries 5. Keep a backup system controller, charged batteries, battery clips, and flashlight near you during sleep in case of electrical power outage 6. Clean battery, battery clip, and universal battery charger contacts weekly 7. Visually inspect percutaneous lead daily 8. Check cables and connectors when changing power source  9. Rotate batteries; keep all eight batteries charged 10. Always have backup system controller, battery clips, fully charged batteries, and spare fully charged batteries when traveling 11. Re-calibrate batteries every 70 uses; monitor battery life of 36 months or 360 uses; replace batteries at end of battery life   Identified the following changes in activities of daily living with pump:  1. No driving for at least six weeks and then only if doctor gives permission to do so 2. No tub baths while pump implanted, and shower only if doctor gives permission 3. No swimming or submersion in water while implanted with pump 4. Keep all VAD equipment away from water or moisture 5. Keep all VAD connections clean and dry 6. No contact sports or engage in jumping activities 7. Avoid strong static electricity (touching TV/computer screens, vacuuming) 8. Never have an MRI while implanted with the pump 9. Never leave or store batteries in extremely hot or cold places (such as   trunk of your car), or the battery  life will be shortened 10. Call the doctor or hospital contact person if any change in how the pump sounds, feels, or works 11. Plan to sleep only when connected to the power module. 12. Keep a backup system controller,  charged batteries, battery clips, and flashlight near you during sleep in case of electrical power outage 13. Do not sleep on your stomach 14. Talk with doctor before any long distance travel plans 15. Patient will need antibiotics prior to any dental procedure; instructed to contact VAD coordinator before any dental procedures (including routine cleaning)   Discharge binder given to patient and include the following: 1. List of emergency contacts 2. Wallet card 3. HM III Luggage tags 4. HM III Alarms for Patients and their Caregivers 5. HM III Patient Handbook 6. HM III Patient Education Program DVD 7. Daily diary sheets 8. Warfarin teaching sheets 9. Nosebleed teaching sheets 10. Medications you may and may not take with CHF list  Discharge equipment includes:  1. Two system controllers 2. One mobile power unit with attached 21 ' patient cable 3. One universal Charity fundraiser (UBC) 4. Eight fully charged batteries  5. Four battery clips 6. One travel case 7. One holster vest 8. Wearable accessory package 9. Daily dressing kits and anchors   Following notification process completed with: [facility] [employer]  Dr Alysia Penna,  PCP   Discussed frequency and importance of INR checks; emphasized importance of maintaining INR goal to prevent clotting and or bleeding issues with pump. Able to answer questions and asked good questions pertaining to warfarin and diet/lifestyle changes necessary to be successful and safe.  Patient will have INR managed in VAD clinic. Current INR goal is 2.0 - 2.5.  He has expressed interest in obtaining home INR monitor once appropriate and if insurance approves. Will work with her in the future for this equipment.   The patient has completed a proficiency test for the HM III and all questions have been answered. The pt and family have been instructed to call if any questions, problems, or concerns arise. Pt and caregiver  successfully paged VAD coordinator using VAD pager emergency number and have been instructed to use this number only for emergencies.  Patient and caregiver(s) asked appropriate questions, had good interaction with VAD coordinator, and verbalized understanding of above instructions.   Marcus Johnson and Marcus Johnson will need further education re: exit site care / dressing changes. VAD coordinators will continue to review this with them while pt is in CIR.    Emerson Monte RN [place] Coordinator  Office: [phone]  24/7 Pager: [phone]

## 2020-11-14 NOTE — Progress Notes (Signed)
Pt has TL PICC with heparin running. Notified pharmacy that VAST was drawing off of the line. Waiting 20min as recommended by pharmacy before drawing heparin level. Fran Lowes, RN VAST

## 2020-11-14 NOTE — Progress Notes (Signed)
Physical Therapy Treatment Patient Details Name: Marcus Johnson. MRN: [id number] DOB: 07/15/1961 Today's Date: 11/14/2020    History of Present Illness 60 y.o. male admitted 2/28 with ICM for LVAD workup. Impella placed 3/1. Heartmate 3 LVAD placed 3/10. CRRT started 3/15. CRRT held 3/23-3/24 then restarted until 3/27 with iHD started 3/28. PMHx: DM2, CAD s/Johnson CABG 0086, systolic HF due to ischemic cardiomyopathy with EF 20-25% (echo 12/15), DM2 and CKD. He is s/Johnson Medtronic single chamber ICD. Covid 1/22.    PT Comments    Pt pleasant and singing/dancing with [employer] playing today. Pt remains motivated and progressing with power transition with decreased need for cues for sequence and improving ability to manipulate equipment to connect and disconnect battery clips and power cord. Pt limited by jerking and weakness in standing during gait trials today with longest distance 7' but pt with 3 attempts and determination. Improved ROM with seated HEP today. No dizziness or spinning with session and pt reports he has been maintaining exiting to right side of bed to control vertigo. Plan remains appropriate.   Flow 4.3, speed 5200,PI 3.8>3.4, power 3.8 HR 90 Pre gait 94/82 (87) Post gait 102/70 (81)     Follow Up Recommendations  CIR;Supervision/Assistance - 24 hour     Equipment Recommendations  3in1 (PT)    Recommendations for Other Services       Precautions / Restrictions Precautions Precautions: Fall;Sternal;Other (comment) Precaution Comments: LVAD, sacral wound, Left BPPV Other Brace: thumb loop and buddy strap R hand Restrictions Weight Bearing Restrictions: No    Mobility  Bed Mobility Overal bed mobility: Needs Assistance Bed Mobility: Rolling;Sidelying to Sit Rolling: Min guard Sidelying to sit: Min guard       General bed mobility comments: pt able to roll to right and rise from side with only cues and increased time, no spinning with transition     Transfers Overall transfer level: Needs assistance     Sit to Stand: Min guard         General transfer comment: minguard x 3 trials with good hand placement with guarding to rise. Min assist to sit due to pt with jerking and reports of weakness with need to sit  Ambulation/Gait Ambulation/Gait assistance: Min assist;+2 safety/equipment Gait Distance (Feet): 60 Feet Assistive device: 1 person hand held assist Gait Pattern/deviations: Step-through pattern;Decreased stride length   Gait velocity interpretation: 1.31 - 2.62 ft/sec, indicative of limited community ambulator General Gait Details: pt walked 60', 70', 60' respectively with close chair follow and limited distances by pt having jerking and leg weakness with need for assist to sit. With each trial PI maintained at  3.4 nd HR 90. Jerking continued grossly 1 min with each sitting trial and then pt able to reattempt gait. Return to room via chair.   Stairs             Wheelchair Mobility    Modified Rankin (Stroke Patients Only)       Balance Overall balance assessment: Needs assistance   Sitting balance-Leahy Scale: Good Sitting balance - Comments: EOB without assist with midline posture   Standing balance support: Single extremity supported Standing balance-Leahy Scale: Poor Standing balance comment: LUE support in standing; HHA                            Cognition Arousal/Alertness: Awake/alert Behavior During Therapy: WFL for tasks assessed/performed Overall Cognitive Status: Impaired/Different from baseline Area of Impairment: Attention;Memory;Following  commands;Safety/judgement;Awareness;Problem solving                   Current Attention Level: Selective Memory: Decreased short-term memory Following Commands: Follows one step commands consistently Safety/Judgement: Decreased awareness of deficits   Problem Solving: Requires verbal cues General Comments: pt recalling all  sternal precautions, all contents of black bag, able to verbalize sequence for power transition but needs min cues for accuracy and mod assist due to weakness in RUE      Exercises General Exercises - Lower Extremity Long Arc Quad: Both;20 reps;Seated;AROM Hip Flexion/Marching: Both;Seated;20 reps;AROM    General Comments        Pertinent Vitals/Pain Pain Score: 5  Pain Location: sacrum Pain Descriptors / Indicators: Grimacing Pain Intervention(s): Limited activity within patient's tolerance;Monitored during session;Repositioned    Home Living                      Prior Function            PT Goals (current goals can now be found in the care plan section) Progress towards PT goals: Progressing toward goals    Frequency    Min 4X/week      PT Plan Current plan remains appropriate    Co-evaluation              AM-PAC PT "6 Clicks" Mobility   Outcome Measure  Help needed turning from your back to your side while in a flat bed without using bedrails?: None Help needed moving from lying on your back to sitting on the side of a flat bed without using bedrails?: A Little Help needed moving to and from a bed to a chair (including a wheelchair)?: A Little Help needed standing up from a chair using your arms (e.g., wheelchair or bedside chair)?: A Little Help needed to walk in hospital room?: A Lot Help needed climbing 3-5 steps with a railing? : Total 6 Click Score: 16    End of Session Equipment Utilized During Treatment: Gait belt Activity Tolerance: Patient tolerated treatment well Patient left: in chair;with call bell/phone within reach;with chair alarm set Nurse Communication: Mobility status PT Visit Diagnosis: Other abnormalities of gait and mobility (R26.89);Muscle weakness (generalized) (M62.81);Difficulty in walking, not elsewhere classified (R26.2);Pain;Unsteadiness on feet (R26.81)     Time: 5993-5701 PT Time Calculation (min) (ACUTE  ONLY): 43 min  Charges:  $Gait Training: 8-22 mins $Therapeutic Exercise: 8-22 mins $Therapeutic Activity: 8-22 mins                     Marcus Johnson, PT Acute Rehabilitation Services Pager: [phone] Office: [place] 11/14/2020, 11:08 AM

## 2020-11-14 NOTE — Progress Notes (Signed)
OT Cancellation Note  Patient Details Name: Marcus Johnson. MRN: [id number] DOB: [birth year]   Cancelled Treatment:    Reason Eval/Treat Not Completed: Other (comment) (LVAD coordinator in room and changing dressing. Pt also planning to transfer to CIR soon.)  [place], OTR/L Acute Rehab Pager: [phone] Office: [phone] 11/14/2020, 2:33 PM

## 2020-11-14 NOTE — Consult Note (Signed)
[place] Nurse wound follow up Patient receiving care in [place]. PT in for hydrotherapy. Patient discharging to CIR today. Please see PT notes on wound details.  I will discontinue the santyl after today's hydrotherapy, and begin bid saline moistened gauze for the stage 3 PI. Val Riles, RN, MSN, CWOCN, CNS-BC, pager [phone]

## 2020-11-14 NOTE — Progress Notes (Signed)
[facility] Progress Note     Assessment/ Plan:   Acute kidney injury on chronic kidney disease stage III: With underlying chronic kidney disease likely from diabetes/hypertension and worsening renal function from hemodynamically mediated ATN and cardiorenal syndrome. CRRT 3/15-3/22, 3/24-27; HD since 3/28. -Unfortunately, no signs of renal recovery at the moment - s/p [facility] 11/07/20 with IR, appreciate assistance - HD TTS schedule now--> next HD 11/15/20 but will give a dose of Lokelma - Outpt CLIP underway--> renal navigator following for improvement.   Acute exacerbation of chronic systolic heart failure with cardiogenic shock:LVAD, placed 3/10. Volume status/UF managed with RRT. Per above UF to euvolemia.  midodrine and warfarin, off Revatio d/t orthostasis  Anemia:Likely secondary to chronic disease and exacerbated by recent surgical losses.Transfuse PRBC as needed. Iron deficient, has beenrepleted IV.  ESA seems to have fallen off MAR- reinstated for 100 mcg q Tuesday  Atrial fibrillation: per cardiology, on amiodarone  Protein calorie malnutrition, hypoalbuminemia: push protein, per primary  Pressure ulcer: getting hydrotherapy  DM II: per primary  Vertigo: scopolamine patch, Epley maneuvers with PT  Dispo: hopeful for CIR.  Pt will need to be able to sit in a chair for OP dialysis  Subjective:   Tolerated HD Sat with 2 L off.  Occasional nausea but not often. Denies dyspnea and already walked this am.   Objective:   BP 94/82 (BP Location: Left Arm)   Pulse 88   Temp 97.6 F (36.4 C) (Oral)   Resp 18   Ht 5' 10.5" (1.791 m)   Wt 83.3 kg   SpO2 94%   BMI 25.98 kg/m   Intake/Output Summary (Last 24 hours) at 11/14/2020 1029 Last data filed at 11/13/2020 1738 Gross per 24 hour  Intake 120 ml  Output --  Net 120 ml   Weight change: 1.4 kg  Physical Exam: Gen: NAD, lying in bed awake and alert CVS: mechanical hum throughout   Resp: clear  Abd: soft, + drive line Ext: no LE edema,  ACCESS: R TDC c/d/i  Imaging: No results found.  Labs: BMET Recent Labs  Lab 11/08/20 0420 11/09/20 0330 11/10/20 0150 11/11/20 0415 11/12/20 0500 11/13/20 0340 11/14/20 0537  NA 136 134* 134* 134* 136 136 135  K 5.5* 4.9 5.2* 4.9 5.0 4.7 5.5*  CL 97* 96* 96* 97* 96* 97* 98  CO2 26 27 26 28 29 28 27   GLUCOSE 110* 73 174* 110* 135* 112* 117*  BUN 70* 40* 57* 39* 58* 35* 53*  CREATININE 6.75* 4.85* 6.09* 4.78* 6.43* 4.69* 6.63*  CALCIUM 9.1 9.2 8.9 9.1 9.3 9.2 9.5  PHOS 7.5* 5.4* 6.4* 4.7* 5.4* 4.2 6.6*   CBC Recent Labs  Lab 11/10/20 0150 11/11/20 0342 11/13/20 0340 11/14/20 0537  WBC 9.7 10.2 10.6* 10.0  HGB 8.7* 8.8* 8.9* 8.8*  HCT 28.6* 29.3* 29.8* 29.2*  MCV 89.7 91.6 92.0 90.4  PLT 251 268 258 248    Medications:    . (feeding supplement) PROSource Plus  30 mL Oral BID BM  . amiodarone  200 mg Oral Daily  . aspirin EC  81 mg Oral Daily  . Chlorhexidine Gluconate Cloth  6 each Topical Daily  . Chlorhexidine Gluconate Cloth  6 each Topical Q0600  . collagenase   Topical Daily  . [START ON 11/15/2020] darbepoetin (ARANESP) injection - DIALYSIS  100 mcg Intravenous Q Tue-HD  . feeding supplement (NEPRO CARB STEADY)  237 mL Oral BID BM  . gabapentin  100 mg  Oral QHS  . insulin aspart  0-15 Units Subcutaneous TID WC  . insulin aspart  0-5 Units Subcutaneous QHS  . insulin glargine  15 Units Subcutaneous Daily  . metoCLOPramide  5 mg Oral TID AC  . [START ON 11/15/2020] midodrine  5 mg Oral 3 times per day on Tue Thu Sat  . multivitamin  1 tablet Oral QHS  . pantoprazole  40 mg Oral Daily  . polyethylene glycol  17 g Oral BID  . rosuvastatin  20 mg Oral Daily  . senna-docusate  2 tablet Oral QHS  . sodium chloride flush  10-40 mL Intracatheter Q12H  . Warfarin - Pharmacist Dosing Inpatient   Does not apply q1600      Otelia Santee, MD 11/14/2020, 10:29 AM

## 2020-11-14 NOTE — Progress Notes (Signed)
ANTICOAGULATION CONSULT NOTE - Follow Up Consult  Pharmacy Consult for Warfarin Indication: LVAD  Allergies  Allergen Reactions  . Meclizine Hcl Anaphylaxis and Swelling  . Ivabradine Nausea Only    Patient Measurements: Height: 5' 10.5" (179.1 cm) Weight: 83.3 kg (183 lb 10.3 oz) IBW/kg (Calculated) : 74.15 Heparin Dosing Weight: 89.2 kg  Vital Signs: Temp: 97.6 F (36.4 C) (04/11 0753) Temp Source: Oral (04/11 0753) BP: 104/81 (04/11 1254) Pulse Rate: 88 (04/11 1254)  Labs: Recent Labs    11/12/20 0500 11/13/20 0340 11/14/20 0537  HGB  --  8.9* 8.8*  HCT  --  29.8* 29.2*  PLT  --  258 248  LABPROT 19.2* 18.4* 16.9*  INR 1.7* 1.6* 1.4*  CREATININE 6.43* 4.69* 6.63*    Estimated Creatinine Clearance: 12.4 mL/min (A) (by C-G formula based on SCr of 6.63 mg/dL (H)).   Medical History: Past Medical History:  Diagnosis Date  . AICD (automatic cardioverter/defibrillator) present    Single-chamber  implantable cardiac defibrillator - Medtronic  . Atrial fibrillation ([place])   . Cataract    Mixed form OU  . CHF (congestive heart failure) ([place])   . Chronic kidney disease   . Chronic kidney disease (CKD), stage III (moderate) (HCC)   . Chronic systolic heart failure ([place])    a. Echo 5/13: Mild LVE, mild LVH, EF 10%, anteroseptal, lateral, apical AK, mild MR, mild LAE, moderately reduced RVSF, mild RAE, PASP 60;  b. 07/2014 Echo: EF 20-2%, diff HK, AKI of antsep/apical/mid-apicalinferior, mod reduced RV.  Marland Kitchen Coronary artery disease    a. s/p CABG 2002 b. LHC 5/13:  dLM 80%, LAD subtotally occluded, pCFX occluded, pRCA 50%, mid? Occlusion with high take off of the PDA with 70% multiple lesions-not bypassed and supplies collaterals to LAD, LIMA-IM/ramus ok, S-OM ok, S-PLA branches ok. Medical therapy was recommended  . COVID   . Diabetic retinopathy (HCC)    NPDR OD, PDR OS  . Dyspnea   . Gout    "on daily RX" (01/08/2018)  . Hypertension   . Hypertensive retinopathy     OU  . Ischemic cardiomyopathy    a. Echo 5/13: Mild LVE, mild LVH, EF 10%, anteroseptal, lateral, apical AK, mild MR, mild LAE, moderately reduced RVSF, mild RAE, PASP 60;  b. 01/2012 s/p MDT [id number] Protecta XT VR AICD;  c. 07/2014 Echo: EF 20-2%, diff HK, AKI of antsep/apical/mid-apicalinferior, mod reduced RV.  Marland Kitchen MRSA (methicillin resistant Staphylococcus aureus)    Status post right foot plantar deep infection with MRSA status post  I&D 10/2008  . Myocardial infarction [facility])    "was told I'd had several before heart OR in 2002" (01/08/2018)  . Noncompliance   . Peripheral neuropathy   . Retinopathy, diabetic, background ([place])   . Syncope   . Type II diabetes mellitus ([place])    requiring insulin   . Vitreous hemorrhage, left (HCC)    and proliferative diabetic retinopathy    Assessment: 60 yo M s/p new LVAD implant with HeartMate III on 3/10. Warfarin started per MD on 3/12, and pharmacy asked to take over warfarin dosing.   INR 2.9>2>1.4 - very sensitive and has been > goal but now dramatic drop iin last 2 days  Will boost and begin low dose heparin drip   He is drinking Ensure daily and eating well - per patient report. CBC stable with Hgb 8s, pltc 300. LDH stable 200s  Goal of Therapy: heparin level approx 0.3  INR 2-2.5 Monitor  platelets by anticoagulation protocol: Yes   Plan:  Warfarin 4 mg x 1 tonight. heparin drip 800 uts/hr No bolus and slowly titrate  Daily PT/INR, CBC, monitor s/sx bleeding   Bonnita Nasuti Pharm.D. CPP, BCPS Clinical Pharmacist [phone] 11/14/2020 1:22 PM      [facility] pharmacy phone numbers are listed on [email or web address]

## 2020-11-14 NOTE — Progress Notes (Signed)
Inpatient Rehabilitation  Patient information reviewed and entered into eRehab system by S[place] Withey M. Emilynn Srinivasan, M.A., CCC/SLP, PPS Coordinator.  Information including medical coding, functional ability and quality indicators will be reviewed and updated through discharge.    

## 2020-11-14 NOTE — Plan of Care (Signed)
Problem: Education: Goal: Knowledge of General Education information will improve Description: Including pain rating scale, medication(s)/side effects and non-pharmacologic comfort measures Outcome: Progressing   Problem: Health Behavior/Discharge Planning: Goal: Ability to manage health-related needs will improve Outcome: Progressing   Problem: Clinical Measurements: Goal: Ability to maintain clinical measurements within normal limits will improve Outcome: Progressing Goal: Will remain free from infection Outcome: Progressing Goal: Diagnostic test results will improve Outcome: Progressing Goal: Cardiovascular complication will be avoided Outcome: Progressing   Problem: Activity: Goal: Risk for activity intolerance will decrease Outcome: Progressing   Problem: Nutrition: Goal: Adequate nutrition will be maintained Outcome: Progressing   Problem: Coping: Goal: Level of anxiety will decrease Outcome: Progressing   Problem: Elimination: Goal: Will not experience complications related to bowel motility Outcome: Progressing   Problem: Pain Managment: Goal: General experience of comfort will improve Outcome: Progressing   Problem: Safety: Goal: Ability to remain free from injury will improve Outcome: Progressing   Problem: Skin Integrity: Goal: Risk for impaired skin integrity will decrease Outcome: Progressing   Problem: Education: Goal: Ability to demonstrate management of disease process will improve Outcome: Progressing Goal: Ability to verbalize understanding of medication therapies will improve Outcome: Progressing Goal: Individualized Educational Video(s) Outcome: Progressing   Problem: Activity: Goal: Capacity to carry out activities will improve Outcome: Progressing   Problem: Cardiac: Goal: Ability to achieve and maintain adequate cardiopulmonary perfusion will improve Outcome: Progressing   Problem: Education: Goal: Knowledge of disease or  condition will improve Outcome: Progressing Goal: Understanding of medication regimen will improve Outcome: Progressing Goal: Individualized Educational Video(s) Outcome: Progressing   Problem: Activity: Goal: Ability to tolerate increased activity will improve Outcome: Progressing   Problem: Cardiac: Goal: Ability to achieve and maintain adequate cardiopulmonary perfusion will improve Outcome: Progressing   Problem: Health Behavior/Discharge Planning: Goal: Ability to safely manage health-related needs after discharge will improve Outcome: Progressing   Problem: Education: Goal: Understanding of CV disease, CV risk reduction, and recovery process will improve Outcome: Progressing Goal: Individualized Educational Video(s) Outcome: Progressing   Problem: Activity: Goal: Ability to return to baseline activity level will improve Outcome: Progressing   Problem: Cardiovascular: Goal: Ability to achieve and maintain adequate cardiovascular perfusion will improve Outcome: Progressing Goal: Vascular access site(s) Level 0-1 will be maintained Outcome: Progressing   Problem: Health Behavior/Discharge Planning: Goal: Ability to safely manage health-related needs after discharge will improve Outcome: Progressing   Problem: Education: Goal: Knowledge of disease and its progression will improve Outcome: Progressing   Problem: Health Behavior/Discharge Planning: Goal: Ability to manage health-related needs will improve Outcome: Progressing   Problem: Clinical Measurements: Goal: Complications related to the disease process or treatment will be avoided or minimized Outcome: Progressing Goal: Dialysis access will remain free of complications Outcome: Progressing   Problem: Activity: Goal: Activity intolerance will improve Outcome: Progressing   Problem: Fluid Volume: Goal: Fluid volume balance will be maintained or improved Outcome: Progressing   Problem:  Nutritional: Goal: Ability to make appropriate dietary choices will improve Outcome: Progressing   Problem: Respiratory: Goal: Respiratory symptoms related to disease process will be avoided Outcome: Progressing   Problem: Self-Concept: Goal: Body image disturbance will be avoided or minimized Outcome: Progressing   Problem: Urinary Elimination: Goal: Progression of disease will be identified and treated Outcome: Progressing   Problem: Education: Goal: Knowledge of disease and its progression will improve Outcome: Progressing   Problem: Health Behavior/Discharge Planning: Goal: Ability to manage health-related needs will improve Outcome: Progressing   Problem: Clinical Measurements: Goal: Complications  related to the disease process or treatment will be avoided or minimized Outcome: Progressing Goal: Dialysis access will remain free of complications Outcome: Progressing   Problem: Activity: Goal: Activity intolerance will improve Outcome: Progressing   Problem: Fluid Volume: Goal: Fluid volume balance will be maintained or improved Outcome: Progressing   Problem: Nutritional: Goal: Ability to make appropriate dietary choices will improve Outcome: Progressing   Problem: Respiratory: Goal: Respiratory symptoms related to disease process will be avoided Outcome: Progressing   Problem: Self-Concept: Goal: Body image disturbance will be avoided or minimized Outcome: Progressing

## 2020-11-14 NOTE — Progress Notes (Signed)
Pt admitted to 4M08. Pt is alert and oriented. Vitals are WNL. Connections to LVAD checked and pump is functioning correctly. Patient has no complaints of pain. Skin checked with Dorthula Nettles RN Care Coordinator. Pt oriented to unit and left in room with call bell in reach and all needs addressed. Bed alarm in place.

## 2020-11-14 NOTE — H&P (Signed)
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 subsequent LVAD placement             -patient may not shower             -ELOS/Goals: 7-10 days, mod I to supervision for mobility and self-care 2.  Antithrombotics: -DVT/anticoagulation:  Pharmaceutical: Coumadin/heparin bridge. Pharmacy to assist.              -  antiplatelet therapy: ASA 3.Right arm neuropathic pain/plexus injury?/Pain Management: Tylenol or oxycodone prn.              --Will increase gabapentin to 200 mg at bedtime.              -sling for RUE support and comfort 4. Mood: LCSW to follow for evaluation and support.              -antipsychotic agents: N/A 5. Neuropsych: This patient is capable of making decisions on his own behalf. 6. Skin/Wound Care: Optimize nutritional status--continue Nephro and prosource.              --Sacral decubitus: Air mattress. Hydrotherapy Mon-Sat per PT             -Monitor incisions for healing.              --Right foot  wound resolved.  7. Fluids/Electrolytes/Nutrition: Strict I/O. Continue to monitor labs with HD?  8. Chronic systolic CHF s/p LVAD 3:               --Cardiology to follow and manage cardiac issues.              -daily weights 9. Acute on chronic anemia: Continue to monitor H/H/ monitor for signs of bleeding.              --Was transfused with multiple units PRBC.             --ON Nulecit Tu/Th/Sa 10. T2DM: Hgb A1c- monitor BS ac/hs.BS reasonable on current intake             --continue Lantus 15 units/am with SSI for elevated BS.  11. ESRD: Hyperkalemia being managed with lokelma/HD.             -- On regular diet w/o FR --question due to intake. Phosphorous levels trending up.               --Schedule HD at the end of the day to help with tolerance of activity during the day.             --RD consult for diet education (multiple restrictions) 12. Mild leucocytosis: Resolving--continue to monitor for signs of infection.  13. A fib: In NSR w/rare episodes of NSVT. Monitor HR tid--controlled on Amiodarone.                     Bary Leriche, PA-C 11/14/2020  I have personally performed a face to face diagnostic evaluation of this patient and formulated the key components of the plan.  Additionally, I have personally reviewed laboratory data, imaging studies, as well as relevant notes and concur with the physician assistant's documentation above.  The patient's status has not changed from the original H&P.  Any changes in documentation from the acute care chart have been noted above.  Meredith Staggers, MD, [facility]

## 2020-11-14 NOTE — Progress Notes (Signed)
Pt's family has already left for the day. Will have to schedule time when pt's son [name] can come to perform dressing change with VAD coordinator observation.   Drive Line: Existing VAD dressing removed and site care performed using sterile technique. Drive line exit site cleaned with Chlora prep applicators x 2, allowed to dry, Cellurate applied to driveline site and gauze dressing with silver strip re-applied. Exit site healing and unincorporated, the velour is fully implanted at exit site. Small amount of serosanguinous drainage. No redness, tenderness, foul odor or rash noted. Drive line anchor re-applied. Continue Monday, Wednesday, Friday dressing changes by VAD coordinator or nurse champion. Next dressing change due 11/16/20.       Emerson Monte RN [facility] Coordinator  Office: [phone]  24/7 Pager: [phone]

## 2020-11-14 NOTE — H&P (Signed)
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 to 200 mg at bedtime.   -sling for RUE support and comfort 4. Mood: LCSW to follow for evaluation and support.   -antipsychotic agents: N/A 5. Neuropsych: This patient is capable of making decisions on his own behalf. 6. Skin/Wound Care: Optimize nutritional status--continue Nephro and prosource.   --Sacral decubitus: Air mattress. Hydrotherapy Mon-Sat per PT  -Monitor incisions for healing.   --Right foot wound resolved.  7. Fluids/Electrolytes/Nutrition: Strict I/O. Continue to monitor labs with HD?  8. Chronic systolic CHF s/p LVAD 3:    --Cardiology to follow and manage cardiac issues.   -daily weights 9. Acute on chronic anemia: Continue to monitor H/H/ monitor for signs of bleeding.   --Was transfused with multiple units PRBC.  --ON Nulecit Tu/Th/Sa 10. T2DM: Hgb A1c- monitor BS ac/hs.BS reasonable on current intake  --continue Lantus  15 units/am with SSI for elevated BS.  11. ESRD: Hyperkalemia being managed with lokelma/HD.  -- On regular diet w/o FR --question due to intake. Phosphorous levels trending up.    --Schedule HD at the end of the day to help with tolerance of activity during the day.  --RD consult for diet education (multiple restrictions) 12. Mild leucocytosis: Resolving--continue to monitor for signs of infection.  13. A fib: In NSR w/rare episodes of NSVT. Monitor HR tid--controlled on Amiodarone.        Bary Leriche, PA-C 11/14/2020

## 2020-11-14 NOTE — TOC Transition Note (Signed)
Transition of Care [facility]) - CM/SW Discharge Note   Patient Details  Name: Marcus Johnson. MRN: [id number] Date of Birth: Jan 10, 1961  Transition of Care [facility]) CM/SW Contact:  Zenon Mayo, RN Phone Number: 11/14/2020, 11:32 AM   Clinical Narrative:    CIR has a bed for patient today and has authorization.  Patient to be dc to CIR today per [facility] with CIR.    Final next level of care: IP Rehab Facility Barriers to Discharge: No Barriers Identified   Patient Goals and CMS Choice Patient states their goals for this hospitalization and ongoing recovery are:: CIR      Discharge Placement                       Discharge Plan and Services   Discharge Planning Services: CM Consult Post Acute Care Choice: Home Health            DME Agency: NA                  Social Determinants of Health (SDOH) Interventions     Readmission Risk Interventions Readmission Risk Prevention Plan 11/14/2020  Transportation Screening Complete  Medication Review Press photographer) Complete  PCP or Specialist appointment within 3-5 days of discharge Complete  HRI or [place] Complete  SW Recovery Care/Counseling Consult Complete  [place] Not Applicable  Some recent data might be hidden

## 2020-11-14 NOTE — Progress Notes (Signed)
Date and time results received: 11/14/20 1:48 PM     Test: Total Hemoglobin Critical Value: 6.7  Name of Provider Notified: Amy Clegg  Orders Received? Or Actions Taken?: CBC ordered

## 2020-11-14 NOTE — Progress Notes (Signed)
Physical Therapy Wound Treatment and Discharge Patient Details  Name: Marcus Johnson. MRN: [id number] Date of Birth: 1961/05/27  Today's Date: 11/14/2020 Time: 1330-1400 Time Calculation (min): 30 min  Subjective  Subjective Assessment Subjective: Pt pleasant and agreeable to hydrotherapy. Patient and Family Stated Goals: did not state Date of Onset:  (unknown) Prior Treatments: dressing changes, prior hydrotherapy  Pain Score:  Premedicated and tolerated treatment well.   Wound Assessment  Pressure Injury 10/16/20 Coccyx Mid Stage 2 -  Partial thickness loss of dermis presenting as a shallow open injury with a red, pink wound bed without slough. Blister that has drained, 2 cm X 4.5 cm (Active)  Wound Image   11/14/20 1426  Dressing Type ABD;Barrier Film (skin prep);Gauze (Comment);Moist to moist;Santyl 11/14/20 1426  Dressing Changed;Clean;Dry;Intact 11/14/20 1426  Dressing Change Frequency Daily 11/14/20 1426  State of Healing Early/partial granulation 11/14/20 1426  Site / Wound Assessment Yellow;Red 11/14/20 1426  % Wound base Red or Granulating 90% 11/14/20 1426  % Wound base Yellow/Fibrinous Exudate 10% 11/14/20 1426  % Wound base Black/Eschar 0% 11/14/20 1426  % Wound base Other/Granulation Tissue (Comment) 0% 11/14/20 1426  Peri-wound Assessment Intact 11/14/20 1426  Wound Length (cm) 6 cm 11/08/20 1132  Wound Width (cm) 7.3 cm 11/08/20 1132  Wound Depth (cm) 0.1 cm 11/08/20 1132  Wound Surface Area (cm^2) 43.8 cm^2 11/08/20 1132  Wound Volume (cm^3) 4.38 cm^3 11/08/20 1132  Tunneling (cm) 0 11/08/20 1207  Undermining (cm) 0 11/08/20 1207  Margins Unattached edges (unapproximated) 11/14/20 1426  Drainage Amount Minimal 11/14/20 1426  Drainage Description Serosanguineous 11/14/20 1426  Treatment Hydrotherapy (Pulse lavage);Packing (Saline gauze) 11/14/20 1426      Hydrotherapy Pulsed lavage therapy - wound location: coccyx Pulsed Lavage with Suction (psi): 8  psi Pulsed Lavage with Suction - Normal Saline Used: 1000 mL Pulsed Lavage Tip: Tip with splash shield Selective Debridement Selective Debridement - Location: coccyx Selective Debridement - Tools Used: Forceps,Scalpel Selective Debridement - Tissue Removed: yellow slough    Wound Assessment and Plan  Wound Therapy - Assess/Plan/Recommendations Wound Therapy - Clinical Statement: Assessed wound with Terrence Dupont and we agreed wound is progressing nicely and ready for d/c from hydrotherapy. Please see updated orders for further wound care. PT Hydrotherapy signing off. Please reconsult if needs change. Wound Therapy - Functional Problem List: decreased mobility Factors Delaying/Impairing Wound Healing: Immobility,Multiple medical problems Hydrotherapy Plan: Debridement,Dressing change,Patient/family education,Pulsatile lavage with suction Wound Therapy - Frequency: 3X / week Wound Therapy - Follow Up Recommendations: dressing changes by RN  Wound Therapy Goals- Improve the function of patient's integumentary system by progressing the wound(s) through the phases of wound healing (inflammation - proliferation - remodeling) by: Wound Therapy Goals - Improve the function of patient's integumentary system by progressing the wound(s) through the phases of wound healing by: Decrease Necrotic Tissue to: 0 Decrease Necrotic Tissue - Progress: Partly met Increase Granulation Tissue to: 100 Increase Granulation Tissue - Progress: Partly met Goals/treatment plan/discharge plan were made with and agreed upon by patient/family: Yes Time For Goal Achievement: 7 days Wound Therapy - Potential for Goals: Good  Goals will be updated until maximal potential achieved or discharge criteria met.  Discharge criteria: when goals achieved, discharge from hospital, MD decision/surgical intervention, no progress towards goals, refusal/missing three consecutive treatments without notification or medical  reason.  GP     Charges PT Wound Care Charges $Wound Debridement up to 20 cm: < or equal to 20 cm $PT Hydrotherapy Dressing: 1 dressing $  PT PLS Gun and Tip: 1 Supply $PT Hydrotherapy Visit: 1 Visit       Thelma Comp 11/14/2020, 2:30 PM  Rolinda Roan, PT, DPT Acute Rehabilitation Services Pager: [phone] Office: [phone]

## 2020-11-14 NOTE — TOC Initial Note (Signed)
Transition of Care [facility]) - Initial/Assessment Note    Patient Details  Name: Marcus Johnson. MRN: [id number] Date of Birth: 23-Mar-1961  Transition [facility] Care [facility]) CM/SW Contact:    Zenon Mayo, RN Phone Number: 11/14/2020, 11:30 AM  Clinical Narrative:                 CIR has a bed for patient today and has authorization.  Patient to be dc to CIR today per [facility] with CIR.  Expected Discharge Plan: IP Rehab Facility Barriers to Discharge: No Barriers Identified   Patient Goals and CMS Choice Patient states their goals for this hospitalization and ongoing recovery are:: CIR      Expected Discharge Plan and Services Expected Discharge Plan: [place]   Discharge Planning Services: CM Consult Post Acute Care Choice: [place] arrangements for the past 2 months: Apartment                   DME Agency: NA                  Prior Living Arrangements/Services Living arrangements for the past 2 months: Apartment Lives with:: Adult Children Patient language and need for interpreter reviewed:: Yes Do you feel safe going back to the place where you live?: Yes      Need for Family Participation in Patient Care: Yes (Comment) Care giver support system in place?: Yes (comment)   Criminal Activity/Legal Involvement Pertinent to Current Situation/Hospitalization: No - Comment as needed  Activities of Daily Living Home Assistive Devices/Equipment: None ADL Screening (condition at time of admission) Patient's cognitive ability adequate to safely complete daily activities?: Yes Is the patient deaf or have difficulty hearing?: No Does the patient have difficulty seeing, even when wearing glasses/contacts?: No Does the patient have difficulty concentrating, remembering, or making decisions?: No Patient able to express need for assistance with ADLs?: Yes Does the patient have difficulty dressing or bathing?: No Independently performs ADLs?: Yes  (appropriate for developmental age) Does the patient have difficulty walking or climbing stairs?: Yes Weakness of Legs: Both Weakness of Arms/Hands: None  Permission Sought/Granted Permission sought to share information with : Family Nature conservation officer Permission granted to share information with : Yes, Verbal Permission Granted        Permission granted to share info w Relationship: Bi,Cody [phone]     Emotional Assessment Appearance:: Appears stated age Attitude/Demeanor/Rapport: Engaged Affect (typically observed): Appropriate Orientation: : Oriented to Self,Oriented to Place,Oriented to  Time,Oriented to Situation Alcohol / Substance Use: Not Applicable Psych Involvement: No (comment)  Admission diagnosis:  Acute on chronic systolic heart failure ([place]) [I50.23] LVAD (left ventricular assist device) present [facility]) [M62.947] Patient Active Problem List   Diagnosis Date Noted  . Pressure injury of skin 10/16/2020  . LVAD (left ventricular assist device) present ([place]) 10/13/2020  . Dental caries   . Loss of teeth due to extraction   . Acute on chronic systolic heart failure ([place]) 09/28/2020  . Osteomyelitis of metatarsal ([place]) 08/11/2018  . Systolic CHF ([place]) 65/46/5035  . Acute on chronic systolic (congestive) heart failure ([place]) 01/07/2018  . CKD (chronic kidney disease), stage III ([place]) 12/20/2017  . Chronic combined systolic and diastolic congestive heart failure ([place]) 04/16/2016  . Generalized weakness 04/15/2016  . Acute renal failure superimposed on stage 3 chronic kidney disease ([place]) 04/15/2016  . Dehydration   . Non compliance with medical treatment   . Obesity (BMI 30-39.9) 07/21/2014  . Cardiomyopathy,  ischemic 07/21/2014  . Coronary artery disease involving native coronary artery of native heart without angina pectoris   . Lower extremity edema 07/19/2014  . DM (diabetes mellitus) type II controlled with renal manifestation ([place]) 07/19/2014  . CAP  (community acquired pneumonia) 10/21/2013  . Dyspnea 10/19/2013  . Automatic implantable cardioverter-defibrillator  Medtronic 01/08/2012  . PLANTAR FASCIITIS 10/08/2008  . CARBUNCLE AND FURUNCLE OF TRUNK 08/09/2008  . HLD (hyperlipidemia) 01/09/2008  . HTN (hypertension) 01/09/2008  . MYOCARDIAL INFARCTION, HX OF 01/09/2008  . Ischemic cardiomyopathy 01/09/2008   PCP:  [name], MD Pharmacy:   [facility], [place] [address] [place] [address] Phone: [phone] Fax: [phone]     Social Determinants of Health (SDOH) Interventions    Readmission Risk Interventions Readmission Risk Prevention Plan 11/14/2020  Transportation Screening Complete  Medication Review ([place]) Complete  PCP or Specialist appointment within 3-5 days of discharge Complete  HRI or [place] Complete  SW Recovery Care/Counseling Consult Complete  [place] Not Applicable  Some recent data might be hidden

## 2020-11-14 NOTE — Progress Notes (Signed)
Patient ID: Marcus Johnson., male   DOB: Sep 30, 1960, 60 y.o.   MRN: [id number]     Advanced Heart Failure Rounding Note  PCP-Cardiologist: No primary care provider on file.   Subjective:    - 2/27 Milrinone switched to dobutamine 5 mcg.  - 2/28 Moved to ICU. Dobutamine increased to 7.5 mcg and Norepi 3 mcg added. Diuretics held.  - 3/1 Impella 5.5 placed.  - 3/4 Teeth removed - 3/10 HM3 LVAD placed - 3/13 VAD speed increased to 5500. [name] out  - 3/14 Nephrology consulted. Given 1UPRBCs  - 3/15 Started CRRT. Given 1UPRBCs.  - 3/23 VAD Speed turned down to 5300  - 3/23 CRRT and Milrinone Stopped. Back in Afib - 3/24 CRRT restarted.  - 3/27 CVVH off.  - 3/28 iHD - 3/29 iHD. Walked 75 feet!! - 3/20 Ramp Echo: Fixed speed: 5200 Low speed limit: 4900 - 4/3 S/P RIJ HD catheter.  - [birth year] 60th B-day!   He has already walked today, went about 200 feet.  Slowly improving.  Still with some jerking at times when walking.   LVAD Interrogation HM 3: Speed: 5200 Flow: 4.4  PI: 3.6 Power: 4.0 VAD interrogated personally. Parameters stable.  LDH 199 INR 1.4  Objective:   Weight Range: 83.3 kg Body mass index is 25.98 kg/m.   Vital Signs:   Temp:  [97.6 F (36.4 C)-98.4 F (36.9 C)] 97.6 F (36.4 C) (04/11 0753) Pulse Rate:  [79-88] 88 (04/11 0753) Resp:  [15-20] 18 (04/11 0753) BP: (94-108)/(72-82) 94/82 (04/11 0753) SpO2:  [94 %] 94 % (04/11 0300) Weight:  [83.3 kg] 83.3 kg (04/11 0300) Last BM Date: 11/12/20  Weight change: Filed Weights   11/12/20 0500 11/13/20 0600 11/14/20 0300  Weight: 83.4 kg 81.9 kg 83.3 kg    Intake/Output:   Intake/Output Summary (Last 24 hours) at 11/14/2020 0840 Last data filed at 11/13/2020 1738 Gross per 24 hour  Intake 120 ml  Output --  Net 120 ml      Physical Exam   MAP 80s General: Well appearing this am. NAD.  HEENT: Normal. Neck: Supple, JVP 10 cm. Carotids OK.  Cardiac:  Mechanical heart sounds with LVAD hum present.   Lungs:  CTAB, normal effort.  Abdomen:  NT, ND, no HSM. No bruits or masses. +BS  LVAD exit site: Well-healed and incorporated. Dressing dry and intact. No erythema or drainage. Stabilization device present and accurately applied. Driveline dressing changed daily per sterile technique. Extremities:  Warm and dry. No cyanosis, clubbing, rash, or edema.  Neuro:  Alert & oriented x 3. Cranial nerves grossly intact. Moves all 4 extremities w/o difficulty. Affect pleasant    Telemetry   NSR 80s with occasional PVCs Personally reviewed   Labs    CBC Recent Labs    11/13/20 0340 11/14/20 0537  WBC 10.6* 10.0  HGB 8.9* 8.8*  HCT 29.8* 29.2*  MCV 92.0 90.4  PLT 258 324   Basic Metabolic Panel Recent Labs    11/13/20 0340 11/14/20 0537  NA 136 135  K 4.7 5.5*  CL 97* 98  CO2 28 27  GLUCOSE 112* 117*  BUN 35* 53*  CREATININE 4.69* 6.63*  CALCIUM 9.2 9.5  MG 2.1 2.3  PHOS 4.2 6.6*   Liver Function Tests Recent Labs    11/13/20 0340 11/14/20 0537  ALBUMIN 2.6* 2.7*   No results for input(s): LIPASE, AMYLASE in the last 72 hours. Cardiac Enzymes No results for input(s): CKTOTAL, CKMB, CKMBINDEX,  TROPONINI in the last 72 hours.  BNP: BNP (last 3 results) Recent Labs    10/27/20 0020 11/02/20 2310 11/10/20 0150  BNP 774.9* 486.0* 905.1*    ProBNP (last 3 results) No results for input(s): PROBNP in the last 8760 hours.   D-Dimer No results for input(s): DDIMER in the last 72 hours. Hemoglobin A1C No results for input(s): HGBA1C in the last 72 hours. Fasting Lipid Panel No results for input(s): CHOL, HDL, LDLCALC, TRIG, CHOLHDL, LDLDIRECT in the last 72 hours. Thyroid Function Tests No results for input(s): TSH, T4TOTAL, T3FREE, THYROIDAB in the last 72 hours.  Invalid input(s): FREET3  Other results:   Imaging    No results found.   Medications:     Scheduled Medications: . (feeding supplement) PROSource Plus  30 mL Oral BID BM  . amiodarone   200 mg Oral Daily  . aspirin EC  81 mg Oral Daily  . Chlorhexidine Gluconate Cloth  6 each Topical Daily  . Chlorhexidine Gluconate Cloth  6 each Topical Q0600  . collagenase   Topical Daily  . [START ON 11/15/2020] darbepoetin (ARANESP) injection - DIALYSIS  100 mcg Intravenous Q Tue-HD  . feeding supplement (NEPRO CARB STEADY)  237 mL Oral BID BM  . gabapentin  100 mg Oral QHS  . insulin aspart  0-15 Units Subcutaneous TID WC  . insulin aspart  0-5 Units Subcutaneous QHS  . insulin glargine  15 Units Subcutaneous Daily  . metoCLOPramide  5 mg Oral TID AC  . midodrine  10 mg Oral 3 times per day on Tue Thu Sat  . midodrine  5 mg Oral 3 times per day on Sun Mon Wed Fri  . multivitamin  1 tablet Oral QHS  . pantoprazole  40 mg Oral Daily  . polyethylene glycol  17 g Oral BID  . rosuvastatin  20 mg Oral Daily  . senna-docusate  2 tablet Oral QHS  . sodium chloride flush  10-40 mL Intracatheter Q12H  . Warfarin - Pharmacist Dosing Inpatient   Does not apply q1600    Infusions: . sodium chloride    . sodium chloride    . ferric gluconate (FERRLECIT/NULECIT) IV Stopped (11/10/20 1826)  . lactated ringers Stopped (10/17/20 1655)    PRN Medications: sodium chloride, sodium chloride, acetaminophen, alteplase, dextrose, fentaNYL (SUBLIMAZE) injection, heparin, heparin, lidocaine (PF), lidocaine-prilocaine, ondansetron (ZOFRAN) IV, oxyCODONE, pentafluoroprop-tetrafluoroeth, senna-docusate, sodium chloride flush, traZODone, white petrolatum    Assessment/Plan   1.Acute on chronic systolic HF -> cardiogenic shock:ICM.HasMedtronic ICD. EF 15%. Low output persisted despite milrinone and then dobutamine with rise in creatinine to 2.5. Impella 5.5 placed 3/1.  Creatinine improved to 1.7 and HM3 LVAD was placed on 3/10. Developed post-op renal failure requiring CVVH. Now off milrinone and epinephrine. Last echo showed moderate RV enlargement with mildly decreased RV function and VAD speed  decreased to 5300 due to left-sided septum. CRRT stopped 3/23 but restarted 3/24. CVVH stopped again on 3/27. Ramp echo 3/30 with speed decreased to 5200. Tolerating iHD for volume management.  MAPs 80s.  Suspect co-ox this morning is not accurate.  - Repeat co-ox.  - Cut back midodrine to 5 tid on HD days.   - Off sildenafil with orthostasis.  - Continue compression stockings.  - HD  volume management, next run tomorrow.   - c/w Reglan and zofran for nausea. Off scopolamine patch due to dizziness. Symptoms improved  2. LVAD: 3/13 VAD speed increased to 5500. 3/23 speed decreased to 5300 and  decreased to 5200 on 3/30. VAD interrogated personally. LDH 199, INR 1.4  - On warfarin, on ASA 81 daily.  - Restart heparin gtt until INR back up to 1.8.   - DL site ok 3. CAD s/p CABG2002:Last cath in 6/19 with patent grafts. - Continue Crestor 40 mg daily.  4. AKI on CKD Stage 3b: Suspect that this is a combination of cardiorenal syndrome and diabetic nephropathy and possibly ATN. Developed post-op AKI. Started on CRRT 3/15. CVVH stopped 3/23 but restarted 3/24. Now getting iHD, looks like he will be HD-dependent.  Has tunneled catheter.  - HD T/Th/S 5. Atrial flutter/fibrillation: s/p DC-CV 08/21/18. Maintaining NSR.     - Continue amio 200 mg daily.   6. Wound R Foot: Partial thickness skin loss noted. Cover with mepilex border and change every 5 days. Resolved.  7. Right arm pain: Likely neuropathic from stretch of brachial plexus with Impella placement.  8. ID:  WBCs normal. Afebrile. Blood Cx- NGTD.  9. GE:ZMOQHUT of very poor control but hgbA1ctrending down. Most recent 7.7%.  Insulin drip stopped 3/24. Blood glucose controlled on reduced Lantus and  sliding scale insulin to moderate.   - Diabetes coordinators have seen 10. Anemia: Has been stable.  11. Deconditioning - PT/OT following, continue aggressive PT/OT efforts - CIR consulted, should be ready to go to CIR when there is a bed.   12. Unstageable Pressure Ulcer on buttocks: Improving.  - On hydrotherapy Mon-Sat.  - Continue reposition every 2 hours side lying position. Reposition frequently in the chair. 13. NSVT: Rare.  Renal Navigator following.  Continue PT/OT. CIR hopefully soon  Loralie Champagne MD 11/14/2020 8:40 AM

## 2020-11-14 NOTE — Progress Notes (Signed)
ANTICOAGULATION CONSULT NOTE - Follow Up Consult  Pharmacy Consult for Heparin (while INR is sub-therapeutic) Indication: LVAD  Allergies  Allergen Reactions  . Meclizine Hcl Anaphylaxis and Swelling  . Ivabradine Nausea Only    Patient Measurements: Height: 5' 10.5" (179.1 cm) Weight: 83.3 kg (183 lb 10.3 oz) IBW/kg (Calculated) : 74.15 Heparin Dosing Weight: 89.2 kg  Vital Signs: Temp: 98.5 F (36.9 C) (04/11 1931) Temp Source: Oral (04/11 1616) BP: 100/80 (04/11 1931) Pulse Rate: 92 (04/11 1931)  Labs: Recent Labs    11/12/20 0500 11/13/20 0340 11/14/20 0537 11/14/20 2000  HGB  --  8.9* 8.8*  --   HCT  --  29.8* 29.2*  --   PLT  --  258 248  --   LABPROT 19.2* 18.4* 16.9*  --   INR 1.7* 1.6* 1.4*  --   HEPARINUNFRC  --   --   --  <0.10*  CREATININE 6.43* 4.69* 6.63*  --     Estimated Creatinine Clearance: 12.4 mL/min (A) (by C-G formula based on SCr of 6.63 mg/dL (H)).   Medical History: Past Medical History:  Diagnosis Date  . AICD (automatic cardioverter/defibrillator) present    Single-chamber  implantable cardiac defibrillator - Medtronic  . Atrial fibrillation ([place])   . Cataract    Mixed form OU  . CHF (congestive heart failure) ([employer])   . Chronic kidney disease   . Chronic kidney disease (CKD), stage III (moderate) (HCC)   . Chronic systolic heart failure ([place])    a. Echo 5/13: Mild LVE, mild LVH, EF 10%, anteroseptal, lateral, apical AK, mild MR, mild LAE, moderately reduced RVSF, mild RAE, PASP 60;  b. 07/2014 Echo: EF 20-2%, diff HK, AKI of antsep/apical/mid-apicalinferior, mod reduced RV.  Marland Kitchen Coronary artery disease    a. s/p CABG 2002 b. LHC 5/13:  dLM 80%, LAD subtotally occluded, pCFX occluded, pRCA 50%, mid? Occlusion with high take off of the PDA with 70% multiple lesions-not bypassed and supplies collaterals to LAD, LIMA-IM/ramus ok, S-OM ok, S-PLA branches ok. Medical therapy was recommended  . COVID   . Diabetic retinopathy (HCC)    NPDR  OD, PDR OS  . Dyspnea   . Gout    "on daily RX" (01/08/2018)  . Hypertension   . Hypertensive retinopathy    OU  . Ischemic cardiomyopathy    a. Echo 5/13: Mild LVE, mild LVH, EF 10%, anteroseptal, lateral, apical AK, mild MR, mild LAE, moderately reduced RVSF, mild RAE, PASP 60;  b. 01/2012 s/p MDT [id number] Protecta XT VR AICD;  c. 07/2014 Echo: EF 20-2%, diff HK, AKI of antsep/apical/mid-apicalinferior, mod reduced RV.  Marland Kitchen MRSA (methicillin resistant Staphylococcus aureus)    Status post right foot plantar deep infection with MRSA status post  I&D 10/2008  . Myocardial infarction [facility])    "was told I'd had several before heart OR in 2002" (01/08/2018)  . Noncompliance   . Peripheral neuropathy   . Retinopathy, diabetic, background ([place])   . Syncope   . Type II diabetes mellitus ([place])    requiring insulin   . Vitreous hemorrhage, left (HCC)    and proliferative diabetic retinopathy    Assessment: 60 yo M s/p new LVAD implant with HeartMate III on 3/10. Warfarin started per MD on 3/12, and pharmacy asked to take over warfarin dosing.   INR 2.9>2>1.4 - very sensitive and has been > goal but now dramatic drop iin last 2 days  Will boost and begin low dose heparin drip  He is drinking Ensure daily and eating well - per patient report. CBC stable with Hgb 8s, pltc 300. LDH stable 200s  4/11 PM update:  Heparin level is sub-therapeutic  No current issues per RN  Goal of Therapy: Heparin level ~0.3 units/mL INR 2-2.5 Monitor platelets by anticoagulation protocol: Yes   Plan:  No boluses  Inc heparin to 900 units/hr Re-check heparin level in 8 hours  Narda Bonds, PharmD, [place] Pharmacist Phone: [phone]

## 2020-11-14 NOTE — Progress Notes (Signed)
CARDIAC REHAB PHASE I   Brief d/c education completed with pt and family. Pt given in-the-tube sheet. Encouraged continued IS use, walks, and sternal precautions. Will refer to CRP II [place] with knowledge to complete CIR first.   [id number] Rufina Falco, RN BSN 11/14/2020 11:35 AM

## 2020-11-14 NOTE — Telephone Encounter (Signed)
Advanced Heart Failure Patient Advocate Encounter   Patient was approved to receive Entresto from [employer]  Patient ID: [id number] Effective dates: 11/07/20 through 08/05/21

## 2020-11-15 DIAGNOSIS — I951 Orthostatic hypotension: Secondary | ICD-10-CM | POA: Diagnosis not present

## 2020-11-15 DIAGNOSIS — I5022 Chronic systolic (congestive) heart failure: Secondary | ICD-10-CM

## 2020-11-15 DIAGNOSIS — Z95811 Presence of heart assist device: Secondary | ICD-10-CM | POA: Diagnosis not present

## 2020-11-15 DIAGNOSIS — R5381 Other malaise: Secondary | ICD-10-CM | POA: Diagnosis not present

## 2020-11-15 LAB — CBC
HCT: 28.4 % — ABNORMAL LOW (ref 39.0–52.0)
Hemoglobin: 8.5 g/dL — ABNORMAL LOW (ref 13.0–17.0)
MCH: 27.4 pg (ref 26.0–34.0)
MCHC: 29.9 g/dL — ABNORMAL LOW (ref 30.0–36.0)
MCV: 91.6 fL (ref 80.0–100.0)
Platelets: 244 10*3/uL (ref 150–400)
RBC: 3.1 MIL/uL — ABNORMAL LOW (ref 4.22–5.81)
RDW: 17.4 % — ABNORMAL HIGH (ref 11.5–15.5)
WBC: 9.9 10*3/uL (ref 4.0–10.5)
nRBC: 0 % (ref 0.0–0.2)

## 2020-11-15 LAB — GLUCOSE, CAPILLARY
Glucose-Capillary: 125 mg/dL — ABNORMAL HIGH (ref 70–99)
Glucose-Capillary: 152 mg/dL — ABNORMAL HIGH (ref 70–99)
Glucose-Capillary: 115 mg/dL — ABNORMAL HIGH (ref 70–99)
Glucose-Capillary: 119 mg/dL — ABNORMAL HIGH (ref 70–99)

## 2020-11-15 LAB — RENAL FUNCTION PANEL
Albumin: 2.8 g/dL — ABNORMAL LOW (ref 3.5–5.0)
Anion gap: 14 (ref 5–15)
BUN: 66 mg/dL — ABNORMAL HIGH (ref 6–20)
CO2: 23 mmol/L (ref 22–32)
Calcium: 9.2 mg/dL (ref 8.9–10.3)
Chloride: 97 mmol/L — ABNORMAL LOW (ref 98–111)
Creatinine, Ser: 7.73 mg/dL — ABNORMAL HIGH (ref 0.61–1.24)
GFR, Estimated: 7 mL/min — ABNORMAL LOW (ref >60)
Glucose, Bld: 130 mg/dL — ABNORMAL HIGH (ref 70–99)
Phosphorus: 8.5 mg/dL — ABNORMAL HIGH (ref 2.5–4.6)
Potassium: 5.3 mmol/L — ABNORMAL HIGH (ref 3.5–5.1)
Sodium: 134 mmol/L — ABNORMAL LOW (ref 135–145)

## 2020-11-15 LAB — MAGNESIUM: Magnesium: 2.4 mg/dL (ref 1.7–2.4)

## 2020-11-15 LAB — PROTIME-INR
INR: 1.5 — ABNORMAL HIGH (ref 0.8–1.2)
Prothrombin Time: 17.9 seconds — ABNORMAL HIGH (ref 11.4–15.2)

## 2020-11-15 LAB — HEPARIN LEVEL (UNFRACTIONATED): Heparin Unfractionated: 0.1 IU/mL — ABNORMAL LOW (ref 0.30–0.70)

## 2020-11-15 LAB — LACTATE DEHYDROGENASE: LDH: 191 U/L (ref 98–192)

## 2020-11-15 MED ORDER — DARBEPOETIN ALFA 100 MCG/0.5ML IJ SOSY
PREFILLED_SYRINGE | INTRAMUSCULAR | Status: AC
  Administered 2020-11-15: 100 ug via INTRAVENOUS
  Filled 2020-11-15: qty 0.5

## 2020-11-15 MED ORDER — POLYETHYLENE GLYCOL 3350 17 G PO PACK
17.0000 g | PACK | Freq: Every day | ORAL | Status: DC
  Administered 2020-11-15 – 2020-11-19 (×3): 17 g via ORAL
  Filled 2020-11-15 (×4): qty 1

## 2020-11-15 MED ORDER — MIDODRINE HCL 5 MG PO TABS
5.0000 mg | ORAL_TABLET | Freq: Three times a day (TID) | ORAL | Status: DC
  Administered 2020-11-15 – 2020-11-25 (×27): 5 mg via ORAL
  Filled 2020-11-15 (×28): qty 1

## 2020-11-15 MED ORDER — WARFARIN SODIUM 2 MG PO TABS
4.0000 mg | ORAL_TABLET | Freq: Once | ORAL | Status: AC
  Administered 2020-11-15: 4 mg via ORAL
  Filled 2020-11-15: qty 2

## 2020-11-15 MED ORDER — MIDODRINE HCL 5 MG PO TABS
5.0000 mg | ORAL_TABLET | ORAL | Status: DC
  Administered 2020-11-15: 5 mg via ORAL
  Filled 2020-11-15: qty 1

## 2020-11-15 MED ORDER — HEPARIN SODIUM (PORCINE) 1000 UNIT/ML IJ SOLN
INTRAMUSCULAR | Status: AC
  Filled 2020-11-15: qty 4

## 2020-11-15 MED ORDER — SODIUM CHLORIDE 0.9% FLUSH
10.0000 mL | INTRAVENOUS | Status: DC | PRN
  Administered 2020-11-16 – 2020-11-18 (×2): 10 mL
  Administered 2020-11-21: 30 mL
  Administered 2020-11-21: 10 mL
  Administered 2020-11-30 – 2020-12-02 (×2): 30 mL

## 2020-11-15 NOTE — Patient Care Conference (Signed)
Inpatient RehabilitationTeam Conference and Plan of Care Update Date: 11/15/2020   Time: 10:12 AM    Patient Name: Marcus Johnson.      Medical Record Number: [id number]  Date of Birth: Nov 17, 1960 Sex: Male         Room/Bed: 4M08C/4M08C-01 Payor Info: Payor: [employer] MEDICARE / Plan: [employer] HMO / Product Type: *No Product type* /    Admit Date/Time:  11/14/2020  3:52 PM  Primary Diagnosis:  [facility] Problems: Principal Problem:   Debility    Expected Discharge Date: Expected Discharge Date:  (2 weeks)  Team Members Present: Physician leading conference: Dr. Alger Simons Care Coodinator Present: Loralee Pacas, LCSWA;Serenitie Vinton Creig Hines, RN, BSN, CRRN Nurse Present: Dorthula Nettles, RN PT Present: Apolinar Junes, PT OT Present: Laverle Hobby, OT PPS Coordinator present : Gunnar Fusi, SLP     Current Status/Progress Goal Weekly Team Focus  Bowel/Bladder   Oliguria.last BM-4/10  remain continent  assess q shift and PRN   Swallow/Nutrition/ Hydration             ADL's             Mobility             Communication             Safety/Cognition/ Behavioral Observations            Pain   no c/o pain  pain,3  assess pain q shift and PRN   Skin   PI st - 2 to coccyx (right and mid sides)  no new skin breakdown  assess q shift and PRN . continue Tx to PI.     Discharge Planning:  Pt to be assessed. Per EMR, pt is a new LVAD; lives with brother and son in an apartment with 27 stairs toget to unit. Pt is new dialysis-MWF.   Team Discussion: Discontinuing hydrotherapy, patient is diabetic and anemic. Patient is on dialysis and continent of bowel, reports 3/10 pain, no sleep issues. Has stage 2 to the sacrum. Educating on LVAD, dressing changes, and wound care. Patient lives his brother and son, does have stairs at the home. Doesn't have HD seat yet.  Patient on target to meet rehab goals: Can't manage his LVAD at this point, can only stand  for several seconds. He is mod assist. He gets tunnel vision which had gotten better on acute but has returned.  evals pending  *See Care Plan and progress notes for long and short-term goals.   Revisions to Treatment Plan:  Not at this time.  Teaching Needs: Family education, medication management, pain management, LVAD training, skin/wound care, dressing changes, transfer training, gait training, balance training, endurance training, stair training, safety awareness, diabetes education.  Current Barriers to Discharge: Decreased caregiver support, Medical stability, Home enviroment access/layout, Wound care, Lack of/limited family support, Weight, Hemodialysis, Weight bearing restrictions, Medication compliance and Behavior  Possible Resolutions to Barriers: Continue current medications, provide emotional support.     Medical Summary Current Status: debility after CHF, LVAD. ESRD on HD. sacral decub stage II, finished hydro. a fib  Barriers to Discharge: Medical stability   Possible Resolutions to [employer] Focus: optimize volume status, local wound care, dc/ed hydrotherapy. lvad mgt, dm mgt   Continued Need for Acute Rehabilitation Level of Care: The patient requires daily medical management by a physician with specialized training in physical medicine and rehabilitation for the following reasons: Direction of a multidisciplinary physical rehabilitation program to maximize functional independence :  Yes Medical management of patient stability for increased activity during participation in an intensive rehabilitation regime.: Yes Analysis of laboratory values and/or radiology reports with any subsequent need for medication adjustment and/or medical intervention. : Yes   I attest that I was present, lead the team conference, and concur with the assessment and plan of the team.   Cristi Loron 11/15/2020, 7:47 PM
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
## 2020-11-15 NOTE — Progress Notes (Signed)
Physical Therapy Session Note  Patient Details  Name: Marcus Johnson. MRN: [id number] Date of Birth: 1960-11-05  Today's Date: 11/15/2020 PT Individual Time: 1111-1207 PT Individual Time Calculation (min): 56 min   Short Term Goals: Week 1:  PT Short Term Goal 1 (Week 1): Patient will perform bed mobility with min A consistently. PT Short Term Goal 2 (Week 1): Patient will perform basic transfers with CGA using LRAD. PT Short Term Goal 3 (Week 1): Patient will ambulate >50 ft without symptoms using LRAD. PT Short Term Goal 4 (Week 1): Patient will tolerate sitting OOB >1 hour. PT Short Term Goal 5 (Week 1): Patient will initate stair training.  Skilled Therapeutic Interventions/Progress Updates:    Pt received R semisidelying in bed stating he is not having a good day but is agreeable to therapy session. Reports sacral wound and R UE pain, unrated, details below. Pt had observable whole body "jerking" throughout session that worsens/is exacerbated in sitting and after activity. Sidelying>sitting R EOB with mod assist for trunk upright. Pt reports "lightheadedness" in sitting that does not improve despite attempts at L UE movements nor seated rest break. Sit>stand EOB>RW with mod assist for coming to stand due to strong posterior push/lean - but then heavy min assist for static standing balance. Pt reports lightheadedness worsens in standing and returns to sitting in <20 seconds. After seated therapeutic rest break pt agreeable to return to standing and attempt gait training despite reporting symptoms not significantly improved and still feeling a baseline "lightheadedness." Returned to standing as above. Gait training ~40ft using RW with mod assist of 1 due to posterior lean that worsens with distance and +2 for line management. Once returned to sitting EOB pt requests to lie down due to persistent lightheadedness. Sit>R sidelying with +2 mod assist for trunk descent and B LE management into  bed. Assisted with therapeutically repositioning on R side for pressure relief with pillows for support. Therapist retrieved TIS wheelchair with [place] cushion to allow pressure relief along with improved upright, activity tolerance due to pt's symptomatic lightheadedness. RN reports pt needing to go down to dialysis in specific dialysis chair - will ask primary PT to discuss with MD regarding concerns of pressure relief while in that chair for many hours. Pt left R sidelying in bed with needs in reach.  Therapy Documentation Precautions:  Precautions Precautions: Fall,Sternal,Other (comment) Precaution Comments: LVAD, sacral wound, Left BPPV Required Braces or Orthoses: Other Brace Other Brace: thumb loop and buddy strap R hand Restrictions Weight Bearing Restrictions: Yes Other Position/Activity Restrictions: sternal precautions  Pain:  Reports sacral wound and R UE pain - provided repositioning, breathing/relaxation strategies, and rest for pain management.   Therapy/Group: Individual Therapy  Tawana Scale , PT, DPT, CSRS   11/15/2020, 12:24 PM

## 2020-11-15 NOTE — Progress Notes (Signed)
PROGRESS NOTE   Subjective/Complaints: Pt had a restless night. Said someone was interrupting him all night. Didn't sleep much. Apparently very light-headed with therapies this morning when attempting to get OOB.   ROS: Patient denies fever, rash, sore throat, blurred vision, nausea, vomiting, diarrhea, cough, shortness of breath or chest pain, joint or back pain, headache, or mood change.    Objective:   No results found. Recent Labs    11/14/20 0537 11/15/20 0543  WBC 10.0 9.9  HGB 8.8* 8.5*  HCT 29.2* 28.4*  PLT 248 244   Recent Labs    11/13/20 0340 11/14/20 0537  NA 136 135  K 4.7 5.5*  CL 97* 98  CO2 28 27  GLUCOSE 112* 117*  BUN 35* 53*  CREATININE 4.69* 6.63*  CALCIUM 9.2 9.5    Intake/Output Summary (Last 24 hours) at 11/15/2020 1019 Last data filed at 11/14/2020 1814 Gross per 24 hour  Intake 236 ml  Output --  Net 236 ml     Pressure Injury 10/16/20 Coccyx Left Stage 2 -  Partial thickness loss of dermis presenting as a shallow open injury with a red, pink wound bed without slough. Blister that has drained, 3.5 cm X 3.5 in size (Active)  10/16/20 0800  Location: Coccyx  Location Orientation: Left  Staging: Stage 2 -  Partial thickness loss of dermis presenting as a shallow open injury with a red, pink wound bed without slough.  Wound Description (Comments): Blister that has drained, 3.5 cm X 3.5 in size  Present on Admission: No     Pressure Injury 10/16/20 Coccyx Mid Stage 2 -  Partial thickness loss of dermis presenting as a shallow open injury with a red, pink wound bed without slough. Blister that has drained, 2 cm X 4.5 cm (Active)  10/16/20 0800  Location: Coccyx  Location Orientation: Mid  Staging: Stage 2 -  Partial thickness loss of dermis presenting as a shallow open injury with a red, pink wound bed without slough.  Wound Description (Comments): Blister that has drained, 2 cm X 4.5 cm   Present on Admission: No     Pressure Injury 10/16/20 Coccyx Right Stage 2 -  Partial thickness loss of dermis presenting as a shallow open injury with a red, pink wound bed without slough. blister that has drained, 0.5 cm X 0.5 cm (Active)  10/16/20 0800  Location: Coccyx  Location Orientation: Right  Staging: Stage 2 -  Partial thickness loss of dermis presenting as a shallow open injury with a red, pink wound bed without slough.  Wound Description (Comments): blister that has drained, 0.5 cm X 0.5 cm  Present on Admission: No    Physical Exam: Vital Signs Blood pressure 110/70, pulse 98, temperature 97.9 F (36.6 C), temperature source Oral, resp. rate 17, height 5' 10.5" (1.791 m), weight 83.3 kg, SpO2 91 %.  General: Alert and oriented x 3, obese HEENT: Head is normocephalic, atraumatic, PERRLA, EOMI, sclera anicteric, oral mucosa pink and moist, dentition intact, ext ear canals clear,  Neck: Supple without JVD or lymphadenopathy Heart: Reg rate and rhythm. No murmurs rubs or gallops Chest: CTA bilaterally without wheezes, rales, or rhonchi;  no distress Abdomen: Soft, non-tender, non-distended, bowel sounds positive. LVAD site clean, dressed Extremities: No clubbing, cyanosis, or edema. Pulses are 2+ Psych: Pt's affect is appropriate. Pt is cooperative Skin: tunneled cath right shoulder. Right foot intact. Sacral wound 4-5 cm and fairly superficial with predominant granulation tissue, bled when dressing removed. Neuro: Pt is cognitively appropriate with normal insight, memory, and awareness. Cranial nerves 2-12 are intact. Sensory exam is normal. Reflexes are 2+ in all 4's. Fine motor coordination is intact. No tremors. Motor function is grossly 4/5 with tremor. RUE 3-4/but limited d/t pain. has scattered sensory loss throughout right arm. LE 3/5 Musculoskeletal: Right shoulder tender with PROM.      Assessment/Plan: 1. Functional deficits which require 3+ hours per day of  interdisciplinary therapy in a comprehensive inpatient rehab setting.  Physiatrist is providing close team supervision and 24 hour management of active medical problems listed below.  Physiatrist and rehab team continue to assess barriers to discharge/monitor patient progress toward functional and medical goals  Care Tool:  Bathing              Bathing assist       Upper Body Dressing/Undressing Upper body dressing        Upper body assist      Lower Body Dressing/Undressing Lower body dressing            Lower body assist       Toileting Toileting    Toileting assist       Transfers Chair/bed transfer  Transfers assist           Locomotion Ambulation   Ambulation assist              Walk 10 feet activity   Assist           Walk 50 feet activity   Assist           Walk 150 feet activity   Assist           Walk 10 feet on uneven surface  activity   Assist           Wheelchair     Assist               Wheelchair 50 feet with 2 turns activity    Assist            Wheelchair 150 feet activity     Assist          Blood pressure 110/70, pulse 98, temperature 97.9 F (36.6 C), temperature source Oral, resp. rate 17, height 5' 10.5" (1.791 m), weight 83.3 kg, SpO2 91 %.  Medical Problem List and Plan: 1.Functional and mobility deficitssecondary to debility after acute on chronic CHF with subsequent LVAD placement -patient maynotshower -ELOS/Goals: ?2 weeks, team conf today, mod I to supervision for mobility and self-care 2. Antithrombotics: -DVT/anticoagulation:Pharmaceutical:Coumadin/heparin bridge. Pharmacy to assist. -antiplatelet therapy: ASA 3.Right arm neuropathic pain/brachial plexus injury?/Pain Management:Tylenol or oxycodone prn.  --Increased gabapentin to 200 mgat bedtime. -sling for RUE support and  comfort  -?RTC involvement 4. Mood:LCSW to follow for evaluation and support. -antipsychotic agents: N/A 5. Neuropsych: This patientiscapable of making decisions on hisown behalf. 6. Skin/Wound Care:Optimize nutritional status--continue Nephro and prosource.  --Sacral decubitus: Air mattress.    -appears much improved-->WTD dressing   -no longer needs hydrotherapy --Right foot wound resolved.  7. Fluids/Electrolytes/Nutrition:Strict I/O. Continue to monitor labs with HD? 8. Chronic systolic CHF s/p LVAD 3:  --Cardiology to follow and  manage cardiac issues.  -need daily weights Filed Weights   11/14/20 1616  Weight: 83.3 kg    4/12 midodrine for bp support on HD days, may need more often--will d/w cards 9. Acute on chronic anemia: Continue to monitor H/H/ monitor for signs of bleeding.  --Was transfused with multiple units PRBC. --ON Nulecit Tu/Th/Sa 10. T2DM: Hgb A1c- monitor BS ac/hs.BS reasonable on current intake --continue Lantus 15 units/am with SSI for elevated BS.   4/11 reasonable control CBG (last 3)  Recent Labs    11/14/20 1639 11/14/20 2108 11/15/20 0559  GLUCAP 119* 135* 125*    11. ESRD: Hyperkalemia being managed with lokelma/HD. -- On regular diet w/o FR --question due to intake. Phosphorous levels trending up.  --Scheduled HD at the end of the day to help with tolerance of activity during the day. --RD consulted for diet education (multiple restrictions) 12. Mild leucocytosis: Resolving--no current signs of infection.  13. A fib: In NSR w/rare episodes of NSVT. Monitor HR tid--controlled on Amiodarone.    LOS: 1 days A FACE TO FACE EVALUATION WAS PERFORMED  Meredith Staggers 11/15/2020, 10:19 AM

## 2020-11-15 NOTE — Progress Notes (Signed)
ANTICOAGULATION CONSULT NOTE - Follow Up Consult  Pharmacy Consult for Warfarin Indication: LVAD  Allergies  Allergen Reactions  . Meclizine Hcl Anaphylaxis and Swelling  . Ivabradine Nausea Only    Patient Measurements: Height: 5' 10.5" (179.1 cm) Weight: 83.3 kg (183 lb 10.3 oz) IBW/kg (Calculated) : 74.15 Heparin Dosing Weight: 89.2 kg  Vital Signs: Temp: 97.9 F (36.6 C) (04/12 0447) Temp Source: Oral (04/12 0447) BP: 110/70 (04/12 0800) Pulse Rate: 98 (04/12 0447)  Labs: Recent Labs    11/13/20 0340 11/14/20 0537 11/14/20 2000 11/15/20 0543 11/15/20 0810  HGB 8.9* 8.8*  --  8.5*  --   HCT 29.8* 29.2*  --  28.4*  --   PLT 258 248  --  244  --   LABPROT 18.4* 16.9*  --  17.9*  --   INR 1.6* 1.4*  --  1.5*  --   HEPARINUNFRC  --   --  <0.10*  --  0.10*  CREATININE 4.69* 6.63*  --   --   --     Estimated Creatinine Clearance: 12.4 mL/min (A) (by C-G formula based on SCr of 6.63 mg/dL (H)).   Medical History: Past Medical History:  Diagnosis Date  . AICD (automatic cardioverter/defibrillator) present    Single-chamber  implantable cardiac defibrillator - [employer]  . Atrial fibrillation ([place])   . Cataract    Mixed form OU  . CHF (congestive heart failure) ([place])   . Chronic kidney disease   . Chronic kidney disease (CKD), stage III (moderate) (H[facility])   . Chronic systolic heart failure ([place])    a. Echo 5/13: Mild LVE, mild LVH, EF 10%, anteroseptal, lateral, apical AK, mild MR, mild LAE, moderately reduced RVSF, mild RAE, PASP 60;  b. 07/2014 Echo: EF 20-2%, diff HK, AKI of antsep/apical/mid-apicalinferior, mod reduced RV.  Marland Kitchen Coronary artery disease    a. s/p CABG 2002 b. LHC 5/13:  dLM 80%, LAD subtotally occluded, pCFX occluded, pRCA 50%, mid? Occlusion with high take off of the PDA with 70% multiple lesions-not bypassed and supplies collaterals to LAD, LIMA-IM/ramus ok, S-OM ok, S-PLA branches ok. Medical therapy was recommended  . COVID   . Diabetic  retinopathy (HCC)    NPDR OD, PDR OS  . Dyspnea   . Gout    "on daily RX" (01/08/2018)  . Hypertension   . Hypertensive retinopathy    OU  . Ischemic cardiomyopathy    a. Echo 5/13: Mild LVE, mild LVH, EF 10%, anteroseptal, lateral, apical AK, mild MR, mild LAE, moderately reduced RVSF, mild RAE, PASP 60;  b. 01/2012 s/p MDT [id number] Protecta XT VR AICD;  c. 07/2014 Echo: EF 20-2%, diff HK, AKI of antsep/apical/mid-apicalinferior, mod reduced RV.  Marland Kitchen MRSA (methicillin resistant Staphylococcus aureus)    Status post right foot plantar deep infection with MRSA status post  I&D 10/2008  . Myocardial infarction [facility])    "was told I'd had several before heart OR in 2002" (01/08/2018)  . Noncompliance   . Peripheral neuropathy   . Retinopathy, diabetic, background ([place])   . Syncope   . Type II diabetes mellitus ([place])    requiring insulin   . Vitreous hemorrhage, left (HCC)    and proliferative diabetic retinopathy    Assessment: 60 yo M s/p new LVAD implant with HeartMate III on 3/10. Warfarin started per MD on 3/12, and pharmacy asked to take over warfarin dosing.   INR 1.5 - very sensitive and has been > goal but then dramatic drop -  now subtherapeutic  Will boost again and continue low dose heparin drip  Heparin drip 900 uts/hr HL < 0.1   He is drinking Ensure daily and eating well - per patient report. CBC stable with Hgb 8s, pltc 300. LDH stable 200s  Goal of Therapy: heparin level < 0.3  INR 2-2.5 Monitor platelets by anticoagulation protocol: Yes   Plan:  Warfarin 4 mg x 1 tonight repeat heparin drip 1000 uts/hr No bolus and slowly titrate  Daily PT/INR, CBC, monitor s/sx bleeding   Bonnita Nasuti Pharm.D. CPP, BCPS Clinical Pharmacist [phone] 11/15/2020 9:37 AM      [facility] pharmacy phone numbers are listed on [email or web address]

## 2020-11-15 NOTE — Progress Notes (Signed)
Report received form primary nurse Varney Biles stating pt. Refuse to get in reclining chair for HD tx states he feels light headed and weak.
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
## 2020-11-15 NOTE — Progress Notes (Signed)
[facility] Progress Note     Assessment/ Plan:   Acute kidney injury on chronic kidney disease stage III: With underlying chronic kidney disease likely from diabetes/hypertension and worsening renal function from hemodynamically mediated ATN and cardiorenal syndrome. CRRT 3/15-3/22, 3/24-27; HD since 3/28. -Unfortunately, no signs of renal recovery at the moment - s/p [facility] 11/07/20 with IR, appreciate assistance - HD TTS schedule now--> next HD 11/15/20 but will give a dose of Lokelma - Outpt CLIP underway-->renal navigator following for improvement.   I d/w the patient referring for permanent access placement; he was not quite ready. We negotiated and I mentioned that we can continue to look for signs of recovery but if there are no significant signs by early next week then we should refer to [facility]VS for access placement. Pt agrees.  Pt seen on HD at 150pm 2k 91/69 incr from 0 ml to 500 ml net goal as tolerated.   Acute exacerbation of chronic systolic heart failure with cardiogenic shock:LVAD, placed 3/10. Volume status/UF managed with RRT. Per above UF to euvolemia. midodrine and warfarin, off Revatio d/t orthostasis  Anemia:Likely secondary to chronic disease and exacerbated by recent surgical losses.Transfuse PRBC as needed. Iron deficient, has beenrepleted IV. ESA seems to have fallen off MAR- reinstated for 100 mcg q Tuesday  Atrial fibrillation: per cardiology, on amiodarone  Protein calorie malnutrition, hypoalbuminemia: push protein, per primary  Pressure ulcer: getting hydrotherapy  DM II: per primary  Vertigo: scopolamine patch, Epley maneuvers with PT  Dispo: hopeful for CIR. Pt will need to be able to sit in a chair for OP dialysis  Subjective:   Tolerated HD Sat with 2 L off.  Occasional nausea but not often. Denies dyspnea and working as much as he can with PT. Took 3 tries before he was able to tolerate walking   Objective:    BP 91/69 (BP Location: Left Arm)   Pulse 84   Temp 98.9 F (37.2 C) (Oral)   Resp 13   Ht 5' 10.5" (1.791 m)   Wt 83.3 kg   SpO2 91%   BMI 25.98 kg/m   Intake/Output Summary (Last 24 hours) at 11/15/2020 1353 Last data filed at 11/14/2020 1814 Gross per 24 hour  Intake 236 ml  Output --  Net 236 ml   Weight change:   Physical Exam: Gen: NAD, lying in bed awake and alert CVS: mechanical hum throughout  Resp: clear  Abd: soft, + drive line Ext: no LE edema,  ACCESS: R TDC c/d/i  Imaging: No results found.  Labs: BMET Recent Labs  Lab 11/09/20 0330 11/10/20 0150 11/11/20 0415 11/12/20 0500 11/13/20 0340 11/14/20 0537 11/15/20 0543  NA 134* 134* 134* 136 136 135 134*  K 4.9 5.2* 4.9 5.0 4.7 5.5* 5.3*  CL 96* 96* 97* 96* 97* 98 97*  CO2 27 26 28 29 28 27 23   GLUCOSE 73 174* 110* 135* 112* 117* 130*  BUN 40* 57* 39* 58* 35* 53* 66*  CREATININE 4.85* 6.09* 4.78* 6.43* 4.69* 6.63* 7.73*  CALCIUM 9.2 8.9 9.1 9.3 9.2 9.5 9.2  PHOS 5.4* 6.4* 4.7* 5.4* 4.2 6.6* 8.5*   CBC Recent Labs  Lab 11/11/20 0342 11/13/20 0340 11/14/20 0537 11/15/20 0543  WBC 10.2 10.6* 10.0 9.9  HGB 8.8* 8.9* 8.8* 8.5*  HCT 29.3* 29.8* 29.2* 28.4*  MCV 91.6 92.0 90.4 91.6  PLT 268 258 248 244    Medications:    . (feeding supplement) PROSource Plus  30 mL  Oral BID BM  . amiodarone  200 mg Oral Daily  . vitamin C  1,000 mg Oral Daily  . Chlorhexidine Gluconate Cloth  6 each Topical Q0600  . darbepoetin (ARANESP) injection - DIALYSIS  100 mcg Intravenous Q Tue-HD  . feeding supplement (NEPRO CARB STEADY)  237 mL Oral BID BM  . gabapentin  100 mg Oral QHS  . insulin aspart  0-15 Units Subcutaneous TID WC  . insulin aspart  0-5 Units Subcutaneous QHS  . insulin glargine  15 Units Subcutaneous Daily  . metoCLOPramide  5 mg Oral TID AC  . midodrine  5 mg Oral TID WC  . multivitamin  1 tablet Oral QHS  . pantoprazole  40 mg Oral Daily  . polyethylene glycol  17 g Oral QHS  .  rosuvastatin  20 mg Oral Daily  . senna-docusate  2 tablet Oral BID  . Warfarin - Pharmacist Dosing Inpatient   Does not apply q1600      Otelia Santee, MD 11/15/2020, 1:53 PM

## 2020-11-15 NOTE — Progress Notes (Addendum)
Patient ID: Marcus Johnson., male   DOB: 01-May-1961, 60 y.o.   MRN: [id number]  SW unable to meet with pt due to pt being in dialysis. SW to follow-up to complete assessment.   Loralee Pacas, MSW, [place] Office: [phone] Cell: [phone] Fax: [phone]

## 2020-11-15 NOTE — Progress Notes (Addendum)
Patient ID: Marcus Blew., male   DOB: 10-24-1960, 60 y.o.   MRN: [id number]     Advanced Heart Failure Rounding Note  PCP-Cardiologist: No primary care provider on file.   Subjective:    - 2/27 Milrinone switched to dobutamine 5 mcg.  - 2/28 Moved to ICU. Dobutamine increased to 7.5 mcg and Norepi 3 mcg added. Diuretics held.  - 3/1 Impella 5.5 placed.  - 3/4 Teeth removed - 3/10 HM3 LVAD placed - 3/13 VAD speed increased to 5500. [name] out  - 3/14 Nephrology consulted. Given 1UPRBCs  - 3/15 Started CRRT. Given 1UPRBCs.  - 3/23 VAD Speed turned down to 5300  - 3/23 CRRT and Milrinone Stopped. Back in Afib - 3/24 CRRT restarted.  - 3/27 CVVH off.  - 3/28 iHD - 3/29 iHD. Walked 75 feet!! - 3/20 Ramp Echo: Fixed speed: 5200 Low speed limit: 4900 - 4/3 S/P RIJ HD catheter.  - 4/10 60th B-day!  - 4/11 Transferred to CIR. Back on heparin drip.   Had some dizziness this morning. Denies SOB.   LVAD Interrogation HM 3: Speed: 5200 Flow: 4.4  PI: 3.9 Power: 4.0 VAD interrogated personally. Parameters stable.  LDH 191 INR 1.5  Objective:   Weight Range: 83.3 kg Body mass index is 25.98 kg/m.   Vital Signs:   Temp:  [97.9 F (36.6 C)-98.5 F (36.9 C)] 97.9 F (36.6 C) (04/12 0447) Pulse Rate:  [79-98] 98 (04/12 0447) Resp:  [17-20] 17 (04/12 0447) BP: (97-110)/(63-86) 110/70 (04/12 0800) SpO2:  [91 %-100 %] 91 % (04/12 0447) Weight:  [83.3 kg] 83.3 kg (04/11 1616) Last BM Date: 11/12/20  Weight change: Filed Weights   11/14/20 1616  Weight: 83.3 kg    Intake/Output:   Intake/Output Summary (Last 24 hours) at 11/15/2020 0903 Last data filed at 11/14/2020 1814 Gross per 24 hour  Intake 236 ml  Output --  Net 236 ml      Physical Exam   MAP 90s  Physical Exam: GENERAL: No acute distress. HEENT: normal  NECK: Supple, JVP 7-8 .  2+ bilaterally, no bruits.  No lymphadenopathy or thyromegaly appreciated.   CARDIAC:  Mechanical heart sounds with LVAD  hum present. R upper chest HD catheter.  LUNGS:  Clear to auscultation bilaterally.  ABDOMEN:  Soft, round, nontender, positive bowel sounds x4.     LVAD exit site:  Dressing dry and intact.  No erythema or drainage.  Stabilization device present and accurately applied.  Driveline dressing is being changed daily per sterile technique. EXTREMITIES:  Warm and dry, no cyanosis, clubbing, rash or edema . RUE PICC  NEUROLOGIC:  Alert and oriented x 3.    No aphasia.  No dysarthria.  Affect pleasant.      Labs    CBC Recent Labs    11/14/20 0537 11/15/20 0543  WBC 10.0 9.9  HGB 8.8* 8.5*  HCT 29.2* 28.4*  MCV 90.4 91.6  PLT 248 425   Basic Metabolic Panel Recent Labs    11/13/20 0340 11/14/20 0537 11/15/20 0543  NA 136 135  --   K 4.7 5.5*  --   CL 97* 98  --   CO2 28 27  --   GLUCOSE 112* 117*  --   BUN 35* 53*  --   CREATININE 4.69* 6.63*  --   CALCIUM 9.2 9.5  --   MG 2.1 2.3 2.4  PHOS 4.2 6.6*  --    Liver Function Tests Recent Labs  11/13/20 0340 11/14/20 0537  ALBUMIN 2.6* 2.7*   No results for input(s): LIPASE, AMYLASE in the last 72 hours. Cardiac Enzymes No results for input(s): CKTOTAL, CKMB, CKMBINDEX, TROPONINI in the last 72 hours.  BNP: BNP (last 3 results) Recent Labs    10/27/20 0020 11/02/20 2310 11/10/20 0150  BNP 774.9* 486.0* 905.1*    ProBNP (last 3 results) No results for input(s): PROBNP in the last 8760 hours.   D-Dimer No results for input(s): DDIMER in the last 72 hours. Hemoglobin A1C No results for input(s): HGBA1C in the last 72 hours. Fasting Lipid Panel No results for input(s): CHOL, HDL, LDLCALC, TRIG, CHOLHDL, LDLDIRECT in the last 72 hours. Thyroid Function Tests No results for input(s): TSH, T4TOTAL, T3FREE, THYROIDAB in the last 72 hours.  Invalid input(s): FREET3  Other results:   Imaging    No results found.   Medications:     Scheduled Medications: . (feeding supplement) PROSource Plus  30 mL  Oral BID BM  . amiodarone  200 mg Oral Daily  . vitamin C  1,000 mg Oral Daily  . Chlorhexidine Gluconate Cloth  6 each Topical Daily  . Chlorhexidine Gluconate Cloth  6 each Topical Q0600  . darbepoetin (ARANESP) injection - DIALYSIS  100 mcg Intravenous Q Tue-HD  . feeding supplement (NEPRO CARB STEADY)  237 mL Oral BID BM  . feeding supplement (NEPRO CARB STEADY)  237 mL Oral TID WC  . gabapentin  100 mg Oral QHS  . insulin aspart  0-15 Units Subcutaneous TID WC  . insulin aspart  0-5 Units Subcutaneous QHS  . insulin glargine  15 Units Subcutaneous Daily  . metoCLOPramide  5 mg Oral TID AC  . midodrine  5 mg Oral 3 times per day on Tue Thu Sat  . multivitamin  1 tablet Oral QHS  . pantoprazole  40 mg Oral Daily  . rosuvastatin  20 mg Oral Daily  . senna-docusate  2 tablet Oral BID  . Warfarin - Pharmacist Dosing Inpatient   Does not apply q1600    Infusions: . sodium chloride    . heparin      PRN Medications: sodium chloride, acetaminophen, alteplase, alum & mag hydroxide-simeth, bisacodyl, dextrose, diphenhydrAMINE, guaiFENesin-dextromethorphan, heparin, lidocaine-prilocaine, ondansetron (ZOFRAN) IV, oxyCODONE, pentafluoroprop-tetrafluoroeth, polyethylene glycol, prochlorperazine **OR** prochlorperazine **OR** prochlorperazine, sodium chloride flush, sodium phosphate, traZODone, white petrolatum    Assessment/Plan   1.Acute on chronic systolic HF -> cardiogenic shock:ICM.Has[employer] ICD. EF 15%. Low output persisted despite milrinone and then dobutamine with rise in creatinine to 2.5. Impella 5.5 placed 3/1.  Creatinine improved to 1.7 and HM3 LVAD was placed on 3/10. Developed post-op renal failure requiring CVVH. Now off milrinone and epinephrine. Last echo showed moderate RV enlargement with mildly decreased RV function and VAD speed decreased to 5300 due to left-sided septum. CRRT stopped 3/23 but restarted 3/24. CVVH stopped again on 3/27. Ramp echo 3/30 with speed  decreased to 5200. Tolerating iHD for volume management.     - Continue midodrine to 5 tid on HD days.   - Off sildenafil with orthostasis.  - Continue compression stockings.  - HD  volume management, next run today.    - c/w Reglan and zofran for nausea. Off scopolamine patch due to dizziness. Symptoms improved  2. LVAD: 3/13 VAD speed increased to 5500. 3/23 speed decreased to 5300 and decreased to 5200 on 3/30. VAD interrogated personally. LDH 191, INR 1.5 today.  - On warfarin, on ASA 81 daily.  - 4/11  restarted on heparin gtt until INR back up to 1.8.   - DL site ok 3. CAD s/p [id number]:Last cath in 6/19 with patent grafts. - Continue Crestor 40 mg daily.  4. AKI on CKD Stage 3b: Suspect that this is a combination of cardiorenal syndrome and diabetic nephropathy and possibly ATN. Developed post-op AKI. Started on CRRT 3/15. CVVH stopped 3/23 but restarted 3/24. Now getting iHD, looks like he will be HD-dependent.  Has tunneled catheter.  - HD T/Th/S 5. Atrial flutter/fibrillation: s/p DC-CV 08/21/18.  - Continue amio 200 mg daily.   6. Wound R Foot: Partial thickness skin loss noted. Cover with mepilex border and change every 5 days. Resolved.  7. Right arm pain: Likely neuropathic from stretch of brachial plexus with Impella placement.  8. ID:  WBCs normal. Afebrile. Blood Cx- NGTD.  9. OT:LXBWIOM of very poor control but hgbA1ctrending down. Most recent 7.7%.  Insulin drip stopped 3/24. Blood glucose controlled on reduced Lantus and  sliding scale insulin to moderate.   - Diabetes coordinators have seen 10. Anemia: Has been stable.  11. Deconditioning - Rehab Team appreciated.  12. Unstageable Pressure Ulcer on buttocks: Improving.  - On hydrotherapy Mon-Sat.  - Continue reposition every 2 hours side lying position. Reposition frequently in the chair. 13. NSVT: No longer on the monitor.   Renal Navigator following.   Rehab Team appreciated.   Amy Clegg NP-C   11/15/2020 9:03 AM  Patient seen with NP, agree with the above note.   Getting HD currently.  He was more dizzy with standing today than yesterday, did not walk as far.  MAP 70s-80s.   General: NAD Neck: No JVD, no thyromegaly or thyroid nodule.  Lungs: Clear to auscultation bilaterally with normal respiratory effort. CV: Nondisplaced PMI.  Heart regular S1/S2, no S3/S4, no murmur.  No peripheral edema.  No carotid bruit.  Normal pedal pulses.  Abdomen: Soft, nontender, no hepatosplenomegaly, no distention.  Skin: Decubitus ulcer.  Neurologic: Alert and oriented x 3.  Psych: Normal affect. Extremities: No clubbing or cyanosis.  HEENT: Normal.   I will increase him back to midodrine 5 mg tid every day.  I do think that there is a component of BPPV to his dizziness.    Continue aggressive PT work.    Needs heparin gtt until INR 1.8 or above.   Loralie Champagne 11/15/2020 2:39 PM

## 2020-11-15 NOTE — Progress Notes (Signed)
LVAD Coordinator Rounding Note:  Admitted 09/28/20 due to worsening heart failure symptoms. Recent COVID infection January 2022; did not take medications for 3-4 weeks.   S/p dental extractions 10/07/20 per Dr Benson Norway.  HM III LVAD implanted on 10/13/20 by Dr Cyndia Bent under Destination Therapy criteria.  Pt lying in bed asleep upon my arrival. He tells me that he has had two PT sessions already this morning. He feels like like "his legs are weak today." He falls back to sleep mid-conversation, but arouses easily. Denies pain other than sacral pain.   Pt has HD dialysis scheduled on Tues/Thurs/Sat. Planned HD today.   Vital signs: Temp: 98.9 HR: 83 Doppler: 92 Auto cuff: 110/70 (75) O2 Sat: 99% on RA Wt: 200.1>221.7>219.5.Marland KitchenMarland Kitchen>185.8>181>179.9>190.2>185.4>187.6>185.6>183.6 lbs    LVAD interrogation reveals:  Speed: 5200 Flow: 4.5 Power: 3.7w PI: 3.7 Hematocrit: 29  Alarms: none Events: 10 PI events on 11/13/20  Fixed speed: 5200 Low speed limit: 4900  Drive Line: Existing VAD dressing C/D/I with anchor intact and accurately applied. Continue Monday, Wednesday, Friday dressing changes by VAD coordinator or nurse champion. Next dressing change will be due 11/16/20.   Labs:  LDH trend: 444>670>575>571.....>246>244>245>220>209>198>226>209>237>199  INR trend: 1.4>1.7>1.8>1.7>1.6>1.8..........2.8>2.6>3.0>3.1>2.9>2.0>1.4>1.5  Creatinine: 1.84>3.0>3.4>2.73.....2.68>3.11>6.37>4.32>5.8>6.75>4.8>6.09>4.78>5.5  Anticoagulation Plan: -INR Goal: 2.0-2.5 -ASA Dose: 81 mg  Blood Products:  IntraOp:   1 Platelet  6 FFP  3 PRBC  1035 cell saver Postop:   10/13/20:   4 FFP  4 PRBC    10/14/20:  1 FFP  1 PRBC   10/16/20:  1 PRBC   10/18/20:  1 PRBC   10/16/20:  1 PRBC   10/18/20:  1 PRBC   10/27/20:  1 PRBC  Device: notified Ronalee Belts from [employer] to turn pts ICD back on 11/09/20 -  [employer] single -  Pacing: VVI 40 - Therapies: on 200 bpm  Arrythmias: VT in OR requiring  defibrillation   Renal: CRRT   - started 10/19/20   - stopped 10/26/20   - re-started 10/27/20  - stopped 10/30/20   iHD started 10/31/20    Patient Education: 1. Discharge teaching completed yesterday with pt, his brother [name], and his son [name]. 2. Son [name] will need to be observed performing drive line dressing change in order to complete competency.   Plan/Recommendations:  1. Call VAD coordinator for equipment or drive line issues 2. MWF drive line dressing change per VAD coordinator or nurse champion. Next dressing change due 11/16/20.   Emerson Monte RN [place] Coordinator  Office: [phone]  24/7 Pager: [phone]

## 2020-11-16 DIAGNOSIS — R5381 Other malaise: Secondary | ICD-10-CM | POA: Diagnosis not present

## 2020-11-16 DIAGNOSIS — I951 Orthostatic hypotension: Secondary | ICD-10-CM | POA: Diagnosis not present

## 2020-11-16 DIAGNOSIS — Z95811 Presence of heart assist device: Secondary | ICD-10-CM | POA: Diagnosis not present

## 2020-11-16 DIAGNOSIS — I5022 Chronic systolic (congestive) heart failure: Secondary | ICD-10-CM | POA: Diagnosis not present

## 2020-11-16 LAB — GLUCOSE, CAPILLARY
Glucose-Capillary: 87 mg/dL (ref 70–99)
Glucose-Capillary: 127 mg/dL — ABNORMAL HIGH (ref 70–99)
Glucose-Capillary: 147 mg/dL — ABNORMAL HIGH (ref 70–99)
Glucose-Capillary: 133 mg/dL — ABNORMAL HIGH (ref 70–99)

## 2020-11-16 LAB — CBC
HCT: 28.8 % — ABNORMAL LOW (ref 39.0–52.0)
Hemoglobin: 8.6 g/dL — ABNORMAL LOW (ref 13.0–17.0)
MCH: 27.1 pg (ref 26.0–34.0)
MCHC: 29.9 g/dL — ABNORMAL LOW (ref 30.0–36.0)
MCV: 90.9 fL (ref 80.0–100.0)
Platelets: 231 10*3/uL (ref 150–400)
RBC: 3.17 MIL/uL — ABNORMAL LOW (ref 4.22–5.81)
RDW: 17.2 % — ABNORMAL HIGH (ref 11.5–15.5)
WBC: 9.5 10*3/uL (ref 4.0–10.5)
nRBC: 0 % (ref 0.0–0.2)

## 2020-11-16 LAB — LACTATE DEHYDROGENASE: LDH: 196 U/L — ABNORMAL HIGH (ref 98–192)

## 2020-11-16 LAB — RENAL FUNCTION PANEL
Albumin: 2.7 g/dL — ABNORMAL LOW (ref 3.5–5.0)
Anion gap: 10 (ref 5–15)
BUN: 34 mg/dL — ABNORMAL HIGH (ref 6–20)
CO2: 27 mmol/L (ref 22–32)
Calcium: 8.9 mg/dL (ref 8.9–10.3)
Chloride: 96 mmol/L — ABNORMAL LOW (ref 98–111)
Creatinine, Ser: 4.63 mg/dL — ABNORMAL HIGH (ref 0.61–1.24)
GFR, Estimated: 14 mL/min — ABNORMAL LOW (ref >60)
Glucose, Bld: 111 mg/dL — ABNORMAL HIGH (ref 70–99)
Phosphorus: 5.3 mg/dL — ABNORMAL HIGH (ref 2.5–4.6)
Potassium: 4.4 mmol/L (ref 3.5–5.1)
Sodium: 133 mmol/L — ABNORMAL LOW (ref 135–145)

## 2020-11-16 LAB — MAGNESIUM: Magnesium: 2.2 mg/dL (ref 1.7–2.4)

## 2020-11-16 LAB — HEPARIN LEVEL (UNFRACTIONATED): Heparin Unfractionated: 0.11 IU/mL — ABNORMAL LOW (ref 0.30–0.70)

## 2020-11-16 LAB — PROTIME-INR
INR: 1.7 — ABNORMAL HIGH (ref 0.8–1.2)
Prothrombin Time: 19.9 seconds — ABNORMAL HIGH (ref 11.4–15.2)

## 2020-11-16 MED ORDER — MUSCLE RUB 10-15 % EX CREA
TOPICAL_CREAM | Freq: Two times a day (BID) | CUTANEOUS | Status: DC
  Administered 2020-11-17 – 2020-12-03 (×9): 1 via TOPICAL
  Filled 2020-11-16 (×2): qty 85

## 2020-11-16 MED ORDER — WARFARIN SODIUM 3 MG PO TABS
3.0000 mg | ORAL_TABLET | Freq: Once | ORAL | Status: AC
  Administered 2020-11-16: 3 mg via ORAL
  Filled 2020-11-16: qty 1

## 2020-11-16 NOTE — Progress Notes (Addendum)
Occupational Therapy Session Note  Patient Details  Name: Marcus Johnson. MRN: [id number] Date of Birth: 01-Nov-1960  Today's Date: 11/16/2020 OT Individual Time: 1345-1444 OT Individual Time Calculation (min): 59 min    Short Term Goals: Week 1:  OT Short Term Goal 1 (Week 1): Pt will don pants with min A OT Short Term Goal 2 (Week 1): Pt will tolerate standing at the sink for ADL with no c/o light headness OT Short Term Goal 3 (Week 1): Pt will complete toilet transfer with min A  Skilled Therapeutic Interventions/Progress Updates:    1:1. Pt received in bed agreeable to OT. Pt and OT discuss home set up and apartment access as being big barrier to DC. Pt and OT discuss pts hobbies and OT educates on adaptive card holder for playing cards as this is a big pass time at home. Pt reporting agreeable to getting pants on. Pt requires MAX A for donning pants and MAX A for sit to stand at EOB from elevated surface with RW. Pt requries VC for forward weight shift to power up into standing. Ot provides cubes for pt to pick up and place in cup. Pt requires MOD HOH A to reach for cubes with RUE and place into cup on L. Pt continues to c/o dizziness and RN in to assess with doppler. Exited session with pt seated in bed, exit alarm on and call light in reach   Therapy Documentation Precautions:  Precautions Precautions: Fall,Sternal,Other (comment) Precaution Comments: LVAD, sacral wound, Left BPPV Required Braces or Orthoses: Other Brace Other Brace: thumb loop and buddy strap R hand Restrictions Weight Bearing Restrictions: Yes RUE Weight Bearing: Partial weight bearing Other Position/Activity Restrictions: sternal precautions General:   Vital Signs: Therapy Vitals Temp: 97.8 F (36.6 C) Pulse Rate: 78 Resp: 18 BP: (!) 106/93 Patient Position (if appropriate): Lying Oxygen Therapy SpO2: 98 % O2 Device: Room Air Pain:   ADL: ADL Eating: Set up Where Assessed-Eating: Bed  level Grooming: Minimal assistance Where Assessed-Grooming: Edge of bed Upper Body Bathing: Unable to assess Lower Body Bathing: Unable to assess Upper Body Dressing: Unable to assess Lower Body Dressing: Maximal assistance Toileting: Unable to assess Toilet Transfer: Unable to assess Tub/Shower Transfer Method: Unable to assess Gaffer Transfer: Unable to assess Vision   Perception    Praxis   Exercises:   Other Treatments:     Therapy/Group: Individual Therapy  Tonny Branch 11/16/2020, 6:48 AM

## 2020-11-16 NOTE — Progress Notes (Signed)
Unable to weight patient. Bed scale inaccurate, pt unable to stand  Due to weakness. Will give report to morning shift to follow up on Pt's weight.

## 2020-11-16 NOTE — Progress Notes (Addendum)
PROGRESS NOTE   Subjective/Complaints: Had a better night. Right arm still tender. Was quite dizzy yesterday with therapies. Midodrine dose adjusted to TID daily  ROS: Patient denies fever, rash, sore throat, blurred vision, nausea, vomiting, diarrhea, cough, shortness of breath or chest pain, joint or back pain, headache, or mood change.    Objective:   No results found. Recent Labs    11/15/20 0543 11/16/20 0307  WBC 9.9 9.5  HGB 8.5* 8.6*  HCT 28.4* 28.8*  PLT 244 231   Recent Labs    11/15/20 0543 11/16/20 0307  NA 134* 133*  K 5.3* 4.4  CL 97* 96*  CO2 23 27  GLUCOSE 130* 111*  BUN 66* 34*  CREATININE 7.73* 4.63*  CALCIUM 9.2 8.9    Intake/Output Summary (Last 24 hours) at 11/16/2020 0747 Last data filed at 11/15/2020 1559 Gross per 24 hour  Intake --  Output 368 ml  Net -368 ml     Pressure Injury 10/16/20 Coccyx Left Stage 2 -  Partial thickness loss of dermis presenting as a shallow open injury with a red, pink wound bed without slough. Blister that has drained, 3.5 cm X 3.5 in size (Active)  10/16/20 0800  Location: Coccyx  Location Orientation: Left  Staging: Stage 2 -  Partial thickness loss of dermis presenting as a shallow open injury with a red, pink wound bed without slough.  Wound Description (Comments): Blister that has drained, 3.5 cm X 3.5 in size  Present on Admission: No     Pressure Injury 10/16/20 Coccyx Mid Stage 2 -  Partial thickness loss of dermis presenting as a shallow open injury with a red, pink wound bed without slough. Blister that has drained, 2 cm X 4.5 cm (Active)  10/16/20 0800  Location: Coccyx  Location Orientation: Mid  Staging: Stage 2 -  Partial thickness loss of dermis presenting as a shallow open injury with a red, pink wound bed without slough.  Wound Description (Comments): Blister that has drained, 2 cm X 4.5 cm  Present on Admission: No     Pressure Injury  10/16/20 Coccyx Right Stage 2 -  Partial thickness loss of dermis presenting as a shallow open injury with a red, pink wound bed without slough. blister that has drained, 0.5 cm X 0.5 cm (Active)  10/16/20 0800  Location: Coccyx  Location Orientation: Right  Staging: Stage 2 -  Partial thickness loss of dermis presenting as a shallow open injury with a red, pink wound bed without slough.  Wound Description (Comments): blister that has drained, 0.5 cm X 0.5 cm  Present on Admission: No    Physical Exam: Vital Signs Blood pressure (!) 106/93, pulse 78, temperature 97.8 F (36.6 C), resp. rate 18, height 5' 10.5" (1.791 m), weight 83.3 kg, SpO2 98 %.  General: Alert and oriented x 3, obese HEENT: Head is normocephalic, atraumatic, PERRLA, EOMI, sclera anicteric, oral mucosa pink and moist, dentition intact, ext ear canals clear,  Neck: Supple without JVD or lymphadenopathy Heart: Reg rate and rhythm. No murmurs rubs or gallops Chest: CTA bilaterally without wheezes, rales, or rhonchi; no distress Abdomen: Soft, non-tender, non-distended, bowel sounds positive. LVAD  site clean, dressed Extremities: No clubbing, cyanosis, or edema. Pulses are 2+ Psych: Pt's affect is appropriate. Pt is cooperative Skin: tunneled cath right shoulder in place. Right foot intact. Sacral wound 4-5 cm and fairly superficial with predominant granulation tissue--stable. Neuro: Pt is cognitively appropriate with normal insight, memory, and awareness. Cranial nerves 2-12 are intact. Sensory exam is normal. Reflexes are 2+ in all 4's. Fine motor coordination is intact. No tremors. Motor function is grossly 4/5 with tremor. RUE 3-4/but limited d/t pain. has scattered sensory loss throughout right arm. LE 3/5 Musculoskeletal: Right shoulder tender with PROM/AROM      Assessment/Plan: 1. Functional deficits which require 3+ hours per day of interdisciplinary therapy in a comprehensive inpatient rehab  setting.  Physiatrist is providing close team supervision and 24 hour management of active medical problems listed below.  Physiatrist and rehab team continue to assess barriers to discharge/monitor patient progress toward functional and medical goals  Care Tool:  Bathing  Bathing activity did not occur: Safety/medical concerns (time constraints)           Bathing assist       Upper Body Dressing/Undressing Upper body dressing Upper body dressing/undressing activity did not occur (including orthotics): Environmental limitations (PICC running) What is the patient wearing?: Hospital gown only    Upper body assist Assist Level: Total Assistance - Patient < 25%    Lower Body Dressing/Undressing Lower body dressing      What is the patient wearing?: Hospital gown only     Lower body assist Assist for lower body dressing: Total Assistance - Patient < 25%     Toileting Toileting Toileting Activity did not occur Landscape architect and hygiene only): N/A (no void or bm)  Toileting assist Assist for toileting: 2 Helpers     Transfers Chair/bed transfer  Transfers assist  Chair/bed transfer activity did not occur: Safety/medical concerns        Locomotion Ambulation   Ambulation assist   Ambulation activity did not occur: Safety/medical concerns  Assist level: 2 helpers (mod A of 1 and +2 line management) Assistive device: Walker-rolling Max distance: 35ft   Walk 10 feet activity   Assist  Walk 10 feet activity did not occur: Safety/medical concerns  Assist level: 2 helpers (mod A of 1 and +2 line management) Assistive device: Walker-rolling   Walk 50 feet activity   Assist Walk 50 feet with 2 turns activity did not occur: Safety/medical concerns         Walk 150 feet activity   Assist Walk 150 feet activity did not occur: Safety/medical concerns         Walk 10 feet on uneven surface  activity   Assist Walk 10 feet on uneven surfaces  activity did not occur: Safety/medical Armed forces technical officer activity did not occur: Safety/medical concerns         Wheelchair 50 feet with 2 turns activity    Assist    Wheelchair 50 feet with 2 turns activity did not occur: Safety/medical concerns       Wheelchair 150 feet activity     Assist  Wheelchair 150 feet activity did not occur: Safety/medical concerns       Blood pressure (!) 106/93, pulse 78, temperature 97.8 F (36.6 C), resp. rate 18, height 5' 10.5" (1.791 m), weight 83.3 kg, SpO2 98 %.  Medical Problem List and Plan: 1.Functional and mobility  deficitssecondary to debility after acute on chronic CHF with subsequent LVAD placement -patient maynotshower -ELOS/Goals: ?2 weeks, team conf today, mod I to supervision for mobility and self-care 2. Antithrombotics: -DVT/anticoagulation:Pharmaceutical:Coumadin/heparin bridge. Pharmacy to assist. -antiplatelet therapy: ASA 3.Right arm neuropathic pain/brachial plexus injury?/Pain Management:Tylenol or oxycodone prn.  --Increased gabapentin to 200 mgat bedtime with some benefit -sling for RUE support and comfort  -I believe there is rotator cuff involvement on right---will add muscle rub to shoulder.   -ROM with therapy  4. Mood:LCSW to follow for evaluation and support. -antipsychotic agents: N/A 5. Neuropsych: This patientiscapable of making decisions on hisown behalf. 6. Skin/Wound Care:Optimize nutritional status--continue Nephro and prosource.  --Sacral decubitus: Air mattress.    -appears much improved-->WTD dressing   -no longer needs hydrotherapy --Right foot wound resolved.  7. Fluids/Electrolytes/Nutrition:Strict I/O. Continue to monitor labs with HD? 8. Chronic systolic CHF s/p LVAD 3:  --Cardiology to follow and manage  cardiac issues.  -need daily weight again today Filed Weights   11/14/20 1616  Weight: 83.3 kg    4/13 midodrine adjusted to 5mg  TID daily 9. Acute on chronic anemia: Continue to monitor H/H/ monitor for signs of bleeding.  --Was transfused with multiple units PRBC. --ON Nulecit Tu/Th/Sa 10. T2DM: Hgb A1c- monitor BS ac/hs.BS reasonable on current intake --continue Lantus 15 units/am with SSI for elevated BS.   4/13 reasonable control CBG (last 3)  Recent Labs    11/15/20 1726 11/15/20 2115 11/16/20 0555  GLUCAP 115* 119* 87    11. ESRD: Hyperkalemia being managed with lokelma/HD. -On regular diet w/o FR --question due to intake. Phosphorous levels trending up.  --Scheduled HD at the end of the day to help with tolerance of activity during the day. --RD consulted for diet education (multiple restrictions) 12. Mild leucocytosis: Resolving--no current signs of infection.  13. A fib: NSR, HR controlled on Amiodarone.    LOS: 2 days A FACE TO FACE EVALUATION WAS PERFORMED  Meredith Staggers 11/16/2020, 7:47 AM

## 2020-11-16 NOTE — Progress Notes (Addendum)
Physical Therapy Session Note  Patient Details  Name: Marcus Johnson. MRN: [id number] Date of Birth: 03-03-1961  Today's Date: 11/16/2020 PT Individual Time: 1010-1115 PT Individual Time Calculation (min): 65 min   Short Term Goals: Week 1:  PT Short Term Goal 1 (Week 1): Patient will perform bed mobility with min A consistently. PT Short Term Goal 2 (Week 1): Patient will perform basic transfers with CGA using LRAD. PT Short Term Goal 3 (Week 1): Patient will ambulate >50 ft without symptoms using LRAD. PT Short Term Goal 4 (Week 1): Patient will tolerate sitting OOB >1 hour. PT Short Term Goal 5 (Week 1): Patient will initate stair training.  Skilled Therapeutic Interventions/Progress Updates:     Patient in TIS w/c in the room upon PT arrival. Patient alert and agreeable to PT session. Patient denied pain during session, reported need for BM, stating, "I think I have already started."   PT switched patient from monitor to battery power. Patient able to recall sequencing and technique verbally, due to upper extremity coordination deficits and bowl urgency limiting patient's ability to perform at this time.   Therapeutic Activity: Bed Mobility: Patient performed sit to supine with CGA for trunk and lower extremity support. Provided verbal cues for controlled trunk lowering using bottom elbow. Transfers: Patient performed sit to/from stand x3 and stand pivot TIS<>BSC and TIS>bed with min A using RW. Provided verbal cues for hand placement each transfer, forward weight shift using 1-2-3 count, and reaching back to control descent for safety. Patient was unsuccessful with having a BM despite increased time and 3 attempts. Utilized standing balance and NMR to stimulate bowls between attempts without success.  Patient required total A for peri-care and lower body clothing management during toileting. Patient stepped on off scale with min-mod A +2 with assist from NT to attain patient's  weight, see flowsheet for details. Patient with minor LOB and poor awareness when backing off of scale and required mod-max A +2 to return to sitting safely.   Gait Training:  Patient ambulated 43 feet using RW with CGA and w/c follow for safety due to light headedness and decreased activity tolerance. Ambulated with decreased gait speed, decreased step length and height, forward trunk flexion, downward head gaze, and intermittent R/L knee buckling due to jerks without LOB. Provided verbal cues for erect posture, close proximity to RW, increased step height for safety, looking ahead, and pacing for improved activity tolerance with activity.  Neuromuscular Re-ed: Patient performed the following activities: -standing balance progressing from min A to supervision using B upper extremity support on RW 2x2 min -standing mini squats x5 using B upper extremity support on RW -standing marching x10 using B upper extremity support on RW  Patient switched back to monitor with patient using teach back method to tell therapist steps, and batteries returned to the charger with patient sitting EOB prior to end of session.  Patient in bed in preparation for dressing change at end of session with breaks locked, 4 rails up for safety, and all needs within reach.   Therapy Documentation Precautions:  Precautions Precautions: Fall,Sternal,Other (comment) Precaution Comments: LVAD, sacral wound, Left BPPV Required Braces or Orthoses: Other Brace Other Brace: thumb loop and buddy strap R hand Restrictions Weight Bearing Restrictions: Yes RUE Weight Bearing: Partial weight bearing Other Position/Activity Restrictions: sternal precautions   Therapy/Group: Individual Therapy  Kao Conry L Irish Breisch PT, DPT  11/16/2020, 4:19 PM

## 2020-11-16 NOTE — Care Management CC44 (Deleted)
[facility] Individual Statement of Services  Patient Name:  Marcus Johnson.  Date:  11/16/2020  Welcome to the [facility].  Our goal is to provide you with an individualized program based on your diagnosis and situation, designed to meet your specific needs.  With this comprehensive rehabilitation program, you will be expected to participate in at least 3 hours of rehabilitation therapies Monday-Friday, with modified therapy programming on the weekends.  Your rehabilitation program will include the following services:  Physical Therapy (PT), Occupational Therapy (OT), 24 hour per day rehabilitation nursing, Therapeutic Recreaction (TR), Psychology, Neuropsychology, Care Coordinator, Rehabilitation Medicine, Nutrition Services, Pharmacy Services and Other  Weekly team conferences will be held on Tuesdays to discuss your progress.  Your Inpatient Rehabilitation Care Coordinator will talk with you frequently to get your input and to update you on team discussions.  Team conferences with you and your family in attendance may also be held.  Expected length of stay: 2 weeks  Overall anticipated outcome: Supervision  Depending on your progress and recovery, your program may change. Your Inpatient Rehabilitation Care Coordinator will coordinate services and will keep you informed of any changes. Your Inpatient Rehabilitation Care Coordinator's name and contact numbers are listed  below.  The following services may also be recommended but are not provided by the [facility] will be made to provide these services after discharge if needed.  Arrangements include referral to agencies that provide these services.  Your insurance has been verified to be:  [employer]  Your primary doctor  is:  Alysia Penna  Pertinent information will be shared with your doctor and your insurance company.  Inpatient Rehabilitation Care Coordinator:  Cathleen Corti [phone] or ([phone]  Information discussed with and copy given to patient by: Rana Snare, 11/16/2020, 9:13 AM

## 2020-11-16 NOTE — Progress Notes (Signed)
Rounded on patient in room today for follow-up on how HD is going. Patient has granted permission for conversation. [name] that HD has been going well, but he did not attend HD in the chair yesterday due to dizziness. Patient had also become accustomed to completing his treatment in the bed. This RN spoke with patient at great length about the necessity for the patient to attend HD in the chair and the way this replicates HD in the outpatient setting. Patient voices his understanding. This RN to update current order for MD to co-sign. Also assisted patient up in the bed with nursing assistant to facilitate his ability to eat lunch. Patient with no further questions at this time. Will follow-up tomorrow for HD in the chair tolerance.   Dorthey Sawyer, RN  Dialysis Nurse Coordinator [phone]

## 2020-11-16 NOTE — Progress Notes (Signed)
Patient doing therapy in room. Complains of dizziness while in standing position. Patient put back in bed. RN check vital signs, BP 64/33 and HR 115. BP recheck 101/88 HR 97. Patient states symptoms of dizziness is gone and feels better. PA Reesa Chew notified. No new orders at this time.

## 2020-11-16 NOTE — Progress Notes (Signed)
ANTICOAGULATION CONSULT NOTE - Follow Up Consult  Pharmacy Consult for Warfarin Indication: LVAD  Allergies  Allergen Reactions  . Meclizine Hcl Anaphylaxis and Swelling  . Ivabradine Nausea Only    Patient Measurements: Height: 5' 10.5" (179.1 cm) Weight:  (scale on bed broken. RN notified) IBW/kg (Calculated) : 74.15 Heparin Dosing Weight: 89.2 kg  Vital Signs: Temp: 97.8 F (36.6 C) (04/13 0338) BP: 151/118 (04/13 0800) Pulse Rate: 78 (04/13 0338)  Labs: Recent Labs    11/14/20 0537 11/14/20 2000 11/15/20 0543 11/15/20 0810 11/16/20 0307 11/16/20 0337  HGB 8.8*  --  8.5*  --  8.6*  --   HCT 29.2*  --  28.4*  --  28.8*  --   PLT 248  --  244  --  231  --   LABPROT 16.9*  --  17.9*  --  19.9*  --   INR 1.4*  --  1.5*  --  1.7*  --   HEPARINUNFRC  --  <0.10*  --  0.10*  --  0.11*  CREATININE 6.63*  --  7.73*  --  4.63*  --     Estimated Creatinine Clearance: 17.8 mL/min (A) (by C-G formula based on SCr of 4.63 mg/dL (H)).   Medical History: Past Medical History:  Diagnosis Date  . AICD (automatic cardioverter/defibrillator) present    Single-chamber  implantable cardiac defibrillator - [employer]  . Atrial fibrillation ([place])   . Cataract    Mixed form OU  . CHF (congestive heart failure) ([place])   . Chronic kidney disease   . Chronic kidney disease (CKD), stage III (moderate) (HCC)   . Chronic systolic heart failure ([place])    a. Echo 5/13: Mild LVE, mild LVH, EF 10%, anteroseptal, lateral, apical AK, mild MR, mild LAE, moderately reduced RVSF, mild RAE, PASP 60;  b. 07/2014 Echo: EF 20-2%, diff HK, AKI of antsep/apical/mid-apicalinferior, mod reduced RV.  Marland Kitchen Coronary artery disease    a. s/p CABG 2002 b. LHC 5/13:  dLM 80%, LAD subtotally occluded, pCFX occluded, pRCA 50%, mid? Occlusion with high take off of the PDA with 70% multiple lesions-not bypassed and supplies collaterals to LAD, LIMA-IM/ramus ok, S-OM ok, S-PLA branches ok. Medical therapy was recommended   . COVID   . Diabetic retinopathy (HCC)    NPDR OD, PDR OS  . Dyspnea   . Gout    "on daily RX" (01/08/2018)  . Hypertension   . Hypertensive retinopathy    OU  . Ischemic cardiomyopathy    a. Echo 5/13: Mild LVE, mild LVH, EF 10%, anteroseptal, lateral, apical AK, mild MR, mild LAE, moderately reduced RVSF, mild RAE, PASP 60;  b. 01/2012 s/p MDT [id number] Protecta XT VR AICD;  c. 07/2014 Echo: EF 20-2%, diff HK, AKI of antsep/apical/mid-apicalinferior, mod reduced RV.  Marland Kitchen MRSA (methicillin resistant Staphylococcus aureus)    Status post right foot plantar deep infection with MRSA status post  I&D 10/2008  . Myocardial infarction [facility])    "was told I'd had several before heart OR in 2002" (01/08/2018)  . Noncompliance   . Peripheral neuropathy   . Retinopathy, diabetic, background ([place])   . Syncope   . Type II diabetes mellitus ([place])    requiring insulin   . Vitreous hemorrhage, left (HCC)    and proliferative diabetic retinopathy    Assessment: 60 yo M s/p new LVAD implant with HeartMate III on 3/10. Warfarin started per MD on 3/12, and pharmacy asked to take over warfarin dosing.  INR 1.7 - very sensitive and has been > goal but then dramatic drop - now subtherapeutic, continue low dose heparin drip . Heparin drip 1000 uts/hr HL 0.11   He is drinking Ensure daily and eating well - per patient report. CBC stable with Hgb 8s, pltc 300. LDH stable 200s  Goal of Therapy: heparin level < 0.3  INR 2-2.5 Monitor platelets by anticoagulation protocol: Yes   Plan:  Warfarin 3 mg x 1 tonight. Increase IV heparin slightly to 1100 units/hr.  Hopefully INR will be over 1.8 tomorrow and can stop. No bolus and slowly titrate  Daily PT/INR, CBC, monitor s/sx bleeding   Bonnita Nasuti Pharm.D. CPP, BCPS Clinical Pharmacist [phone] 11/16/2020 11:21 AM      [facility] pharmacy phone numbers are listed on [email or web address]

## 2020-11-16 NOTE — Progress Notes (Signed)
Occupational Therapy Session Note  Patient Details  Name: Marcus Johnson. MRN: [id number] Date of Birth: [birth year]  Today's Date: 11/16/2020 OT Individual Time: 2952-8413 OT Individual Time Calculation (min): 70 min    Short Term Goals: Week 1:  OT Short Term Goal 1 (Week 1): Pt will don pants with min A OT Short Term Goal 2 (Week 1): Pt will tolerate standing at the sink for ADL with no c/o light headness OT Short Term Goal 3 (Week 1): Pt will complete toilet transfer with min A  Skilled Therapeutic Interventions/Progress Updates:    Pt supine with no c/o pain at rest. Pt reporting dizziness/lightheadiness has much improved while supine. He transferred to EOB with min A and heavy reliance on bed rails. He sat EOB and reported 5/10 lightheadedness. Sit > stand from EOB with 3 attempts, mod A, benefiting from RW placement. He completed stand pivot with min A, requiring more like mod A to descend safely to [facility]. Cueing required for hand placement with slower processing. Pt reported 7/10 lightheadedness in standing and reporting it lowered to 4/10 seated on BSC. Pt completed UB bathing seated on commode with min A to reach under LUE thoroughly. Pt was able to grasp washcloth and deodorant with R hand. Pt had successful BM void. He required mod A for toileting tasks overall including hygiene in standing. He transferred to the TIS w/c with mod A overall. Oral care completed at the sink with set up assist. Pt was left sitting up in the TIS w/c- PICC running and attached, LVAD on wall power. Chair alarm belt set.   Therapy Documentation Precautions:  Precautions Precautions: Fall,Sternal,Other (comment) Precaution Comments: LVAD, sacral wound, Left BPPV Required Braces or Orthoses: Other Brace Other Brace: thumb loop and buddy strap R hand Restrictions Weight Bearing Restrictions: Yes RUE Weight Bearing: Partial weight bearing Other Position/Activity Restrictions: sternal  precautions   Therapy/Group: Individual Therapy  Curtis Sites 11/16/2020, 6:22 AM

## 2020-11-16 NOTE — Progress Notes (Signed)
Patient ID: Marcus Johnson., male   DOB: 03/14/1961, 60 y.o.   MRN: [id number]     Advanced Heart Failure Rounding Note  PCP-Cardiologist: No primary care provider on file.   Subjective:    - 2/27 Milrinone switched to dobutamine 5 mcg.  - 2/28 Moved to ICU. Dobutamine increased to 7.5 mcg and Norepi 3 mcg added. Diuretics held.  - 3/1 Impella 5.5 placed.  - 3/4 Teeth removed - 3/10 HM3 LVAD placed - 3/13 VAD speed increased to 5500. [name] out  - 3/14 Nephrology consulted. Given 1UPRBCs  - 3/15 Started CRRT. Given 1UPRBCs.  - 3/23 VAD Speed turned down to 5300  - 3/23 CRRT and Milrinone Stopped. Back in Afib - 3/24 CRRT restarted.  - 3/27 CVVH off.  - 3/28 iHD - 3/29 iHD. Walked 75 feet!! - 3/20 Ramp Echo: Fixed speed: 5200 Low speed limit: 4900 - 4/3 S/P RIJ HD catheter.  - 4/10 60th B-day!  - 4/11 Transferred to CIR. Back on heparin drip.   Less dizziness today, did better with PT.  Had HD yesterday.   LVAD Interrogation HM 3: Speed: 5200 Flow: 4.5  PI: 3.2 Power: 3.6. No PI events.  VAD interrogated personally. Parameters stable.  LDH 196 INR 1.7  Objective:   Weight Range: (P) 86.6 kg Body mass index is 27.01 kg/m (pended).   Vital Signs:   Temp:  [97.8 F (36.6 C)-98.6 F (37 C)] 97.8 F (36.6 C) (04/13 0338) Pulse Rate:  [70-99] 78 (04/13 0338) Resp:  [8-18] 18 (04/13 0338) BP: (86-151)/(58-118) 151/118 (04/13 0800) SpO2:  [88 %-100 %] 98 % (04/13 0338) Weight:  [86.6 kg] (P) 86.6 kg (04/13 1217) Last BM Date: 11/12/20  Weight change: Filed Weights   11/16/20 1217  Weight: (P) 86.6 kg    Intake/Output:   Intake/Output Summary (Last 24 hours) at 11/16/2020 1255 Last data filed at 11/15/2020 1559 Gross per 24 hour  Intake --  Output 368 ml  Net -368 ml      Physical Exam   General: Well appearing this am. NAD.  HEENT: Normal. Neck: Supple, JVP 7-8 cm. Carotids OK.  Cardiac:  Mechanical heart sounds with LVAD hum present.  Lungs:   CTAB, normal effort.  Abdomen:  NT, ND, no HSM. No bruits or masses. +BS  LVAD exit site: Well-healed and incorporated. Dressing dry and intact. No erythema or drainage. Stabilization device present and accurately applied. Driveline dressing changed daily per sterile technique. Extremities:  Warm and dry. No cyanosis, clubbing, rash, or edema.  Neuro:  Alert & oriented x 3. Cranial nerves grossly intact. Moves all 4 extremities w/o difficulty. Affect pleasant     Labs    CBC Recent Labs    11/15/20 0543 11/16/20 0307  WBC 9.9 9.5  HGB 8.5* 8.6*  HCT 28.4* 28.8*  MCV 91.6 90.9  PLT 244 976   Basic Metabolic Panel Recent Labs    11/15/20 0543 11/16/20 0307  NA 134* 133*  K 5.3* 4.4  CL 97* 96*  CO2 23 27  GLUCOSE 130* 111*  BUN 66* 34*  CREATININE 7.73* 4.63*  CALCIUM 9.2 8.9  MG 2.4 2.2  PHOS 8.5* 5.3*   Liver Function Tests Recent Labs    11/15/20 0543 11/16/20 0307  ALBUMIN 2.8* 2.7*   No results for input(s): LIPASE, AMYLASE in the last 72 hours. Cardiac Enzymes No results for input(s): CKTOTAL, CKMB, CKMBINDEX, TROPONINI in the last 72 hours.  BNP: BNP (last 3 results)  Recent Labs    10/27/20 0020 11/02/20 2310 11/10/20 0150  BNP 774.9* 486.0* 905.1*    ProBNP (last 3 results) No results for input(s): PROBNP in the last 8760 hours.   D-Dimer No results for input(s): DDIMER in the last 72 hours. Hemoglobin A1C No results for input(s): HGBA1C in the last 72 hours. Fasting Lipid Panel No results for input(s): CHOL, HDL, LDLCALC, TRIG, CHOLHDL, LDLDIRECT in the last 72 hours. Thyroid Function Tests No results for input(s): TSH, T4TOTAL, T3FREE, THYROIDAB in the last 72 hours.  Invalid input(s): FREET3  Other results:   Imaging    No results found.   Medications:     Scheduled Medications: . (feeding supplement) PROSource Plus  30 mL Oral BID BM  . amiodarone  200 mg Oral Daily  . vitamin C  1,000 mg Oral Daily  . Chlorhexidine  Gluconate Cloth  6 each Topical Q0600  . darbepoetin (ARANESP) injection - DIALYSIS  100 mcg Intravenous Q Tue-HD  . feeding supplement (NEPRO CARB STEADY)  237 mL Oral BID BM  . gabapentin  100 mg Oral QHS  . insulin aspart  0-15 Units Subcutaneous TID WC  . insulin aspart  0-5 Units Subcutaneous QHS  . insulin glargine  15 Units Subcutaneous Daily  . metoCLOPramide  5 mg Oral TID AC  . midodrine  5 mg Oral TID WC  . multivitamin  1 tablet Oral QHS  . Muscle Rub   Topical BID  . pantoprazole  40 mg Oral Daily  . polyethylene glycol  17 g Oral QHS  . rosuvastatin  20 mg Oral Daily  . senna-docusate  2 tablet Oral BID  . warfarin  3 mg Oral ONCE-1600  . Warfarin - Pharmacist Dosing Inpatient   Does not apply q1600    Infusions: . sodium chloride    . heparin 1,000 Units/hr (11/15/20 1731)    PRN Medications: sodium chloride, acetaminophen, alteplase, alum & mag hydroxide-simeth, bisacodyl, dextrose, diphenhydrAMINE, guaiFENesin-dextromethorphan, heparin, lidocaine-prilocaine, ondansetron (ZOFRAN) IV, oxyCODONE, pentafluoroprop-tetrafluoroeth, prochlorperazine **OR** prochlorperazine **OR** prochlorperazine, sodium chloride flush, sodium phosphate, traZODone, white petrolatum    Assessment/Plan   1.Acute on chronic systolic HF -> cardiogenic shock:ICM.Has[employer] ICD. EF 15%. Low output persisted despite milrinone and then dobutamine with rise in creatinine to 2.5. Impella 5.5 placed 3/1.  Creatinine improved to 1.7 and HM3 LVAD was placed on 3/10. Developed post-op renal failure requiring CVVH. Now off milrinone and epinephrine. Last echo showed moderate RV enlargement with mildly decreased RV function and VAD speed decreased to 5300 due to left-sided septum. CRRT stopped 3/23 but restarted 3/24. CVVH stopped again on 3/27. Ramp echo 3/30 with speed decreased to 5200. Tolerating iHD for volume management.     - Continue midodrine to 5 tid.   - Off sildenafil with orthostasis.   - Continue compression stockings.  - HD for volume management   - c/w Reglan and zofran for nausea. Off scopolamine patch due to dizziness. Symptoms improved  2. LVAD: 3/13 VAD speed increased to 5500. 3/23 speed decreased to 5300 and decreased to 5200 on 3/30. VAD interrogated personally. LDH 196, INR 1.7 today.  - On warfarin, on ASA 81 daily.  - 4/11 restarted on heparin gtt until INR back up to 1.8.   - DL site ok 3. CAD s/p [id number]:Last cath in 6/19 with patent grafts. - Continue Crestor 40 mg daily.  4. AKI on CKD Stage 3b: Suspect that this is a combination of cardiorenal syndrome and diabetic nephropathy and possibly  ATN. Developed post-op AKI. Started on CRRT 3/15. CVVH stopped 3/23 but restarted 3/24. Now getting iHD, looks like he will be HD-dependent.  Has tunneled catheter.  - HD T/Th/S - Dr Augustin Coupe to talk with [facility], but with continuous flow LVAD, think AV fistula would be less likely to mature.  5. Atrial flutter/fibrillation: s/p DC-CV 08/21/18.  - Continue amio 200 mg daily.   6. Wound R Foot: Partial thickness skin loss noted. Cover with mepilex border and change every 5 days. Resolved.  7. Right arm pain: Likely neuropathic from stretch of brachial plexus with Impella placement.  8. ID:  WBCs normal. Afebrile. Blood Cx- NGTD.  9. VV:KPQAESL of very poor control but hgbA1ctrending down. Most recent 7.7%.  Insulin drip stopped 3/24. Blood glucose controlled on reduced Lantus and  sliding scale insulin to moderate.   - Diabetes coordinators have seen 10. Anemia: Has been stable.  11. Deconditioning - Rehab Team appreciated.  12. Unstageable Pressure Ulcer on buttocks: Improving.  - On hydrotherapy Mon-Sat.  - Continue reposition every 2 hours side lying position. Reposition frequently in the chair. 13. NSVT: No longer on the monitor.    Loralie Champagne 11/16/2020 12:55 PM

## 2020-11-16 NOTE — Progress Notes (Signed)
   11/16/20 1728  Assess: MEWS Score  Temp 98.1 F (36.7 C)  BP 96/80  Pulse Rate (!) 112  Assess: MEWS Score  MEWS Temp 0  MEWS Systolic 1  MEWS Pulse 2  MEWS RR 0  MEWS LOC 0  MEWS Score 3  MEWS Score Color Yellow  Assess: if the MEWS score is Yellow or Red  Were vital signs taken at a resting state? Yes  Focused Assessment No change from prior assessment  Early Detection of Sepsis Score *See Row Information* Medium  MEWS guidelines implemented *See Row Information* Yes  Treat  Pain Scale 0-10  Pain Score 0  Notify: Charge Nurse/RN  Name of Charge Nurse/RN Notified [place]  Date Charge Nurse/RN Notified 11/16/20  Time Charge Nurse/RN Notified 1820  Document  Patient Outcome Other (Comment) (Patient is not unstable)  Progress note created (see row info) Yes

## 2020-11-16 NOTE — Care Management (Signed)
[facility] Individual Statement of Services  Patient Name:  Marcus Johnson.  Date:  11/16/2020  Welcome to the [facility].  Our goal is to provide you with an individualized program based on your diagnosis and situation, designed to meet your specific needs.  With this comprehensive rehabilitation program, you will be expected to participate in at least 3 hours of rehabilitation therapies Monday-Friday, with modified therapy programming on the weekends.  Your rehabilitation program will include the following services:  Physical Therapy (PT), Occupational Therapy (OT), Speech Therapy (ST), 24 hour per day rehabilitation nursing, Therapeutic Recreaction (TR), Psychology, Neuropsychology, Care Coordinator, Rehabilitation Medicine, Nutrition Services, Pharmacy Services and Other  Weekly team conferences will be held on Tuesdays to discuss your progress.  Your Inpatient Rehabilitation Care Coordinator will talk with you frequently to get your input and to update you on team discussions.  Team conferences with you and your family in attendance may also be held.  Expected length of stay: 2 weeks    Overall anticipated outcome: Supervision  Depending on your progress and recovery, your program may change. Your Inpatient Rehabilitation Care Coordinator will coordinate services and will keep you informed of any changes. Your Inpatient Rehabilitation Care Coordinator's name and contact numbers are listed  below.  The following services may also be recommended but are not provided by the [facility] will be made to provide these services after discharge if needed.  Arrangements include referral to agencies that provide these services.  Your insurance has been verified to be:  [employer]  Your primary doctor is:  Alysia Penna  Pertinent information will be shared with your doctor and your insurance company.  Inpatient Rehabilitation Care Coordinator:  Cathleen Corti [phone] or ([phone]  Information discussed with and copy given to patient by: Rana Snare, 11/16/2020, 3:13 PM

## 2020-11-16 NOTE — Progress Notes (Addendum)
[facility] Progress Note     Assessment/ Plan:   Acute kidney injury on chronic kidney disease stage III: With underlying chronic kidney disease likely from diabetes/hypertension and worsening renal function from hemodynamically mediated ATN and cardiorenal syndrome. CRRT 3/15-3/22, 3/24-27; HD since 3/28. -Unfortunately, no signs of renal recovery at the moment - s/p [facility] 11/07/20 with IR, appreciate assistance - HD TTS schedule now--> next HD 11/17/20. Able to UF only 368 ml on 4/12.  - Outpt CLIP underway-->renal navigator following for improvement.   I d/w the patient referring for permanent access placement; he was not quite ready. We will also have to discuss with Dr. Aundra Dubin whether in the subacute setting with a LVAD (placed 3/10) whether the timing is reasonable. Eventually would like to have a permanent acces in the arm given the risk of infection with a TC.  Also d/w Dr. Aundra Dubin and so far with low n success with perm access has not been good in the LVAD setting. I'm asking VVS for their opinion before subjecting the pt to another surgery.  Acute exacerbation of chronic systolic heart failure with cardiogenic shock:LVAD, placed 3/10. Volume status/UF managed with RRT. Per above UF to euvolemia. midodrine and warfarin, off Revatio d/t orthostasis  Anemia:Likely secondary to chronic disease and exacerbated by recent surgical losses.Transfuse PRBC as needed. Iron deficient, has beenrepleted IV. ESA seems to have fallen off MAR- reinstated for 100 mcg q Tuesday  Renal osteodystrophy:  Last phos 5.3 on 4/12  Atrial fibrillation: per cardiology, on amiodarone  Protein calorie malnutrition, hypoalbuminemia: push protein, per primary  Pressure ulcer: getting hydrotherapy  DM II: per primary  Vertigo: scopolamine patch, Epley maneuvers with PT  Dispo: hopeful for CIR. Pt will need to be able to sit in a chair for OP dialysis  Subjective:    Tolerated HD Sat with 2 L off but only 368 on 4/12.   Denies dyspnea and working as much as he can with PT, very deconditioned but he's trying. Has been able to tolerate ambulating but takes multiple efforts.   Objective:   BP (!) 151/118   Pulse 78   Temp 97.8 F (36.6 C)   Resp 18   Ht 5' 10.5" (1.791 m)   Wt 83.3 kg   SpO2 98%   BMI 25.98 kg/m   Intake/Output Summary (Last 24 hours) at 11/16/2020 0819 Last data filed at 11/15/2020 1559 Gross per 24 hour  Intake --  Output 368 ml  Net -368 ml   Weight change:   Physical Exam: Gen: NAD, lying in bed awake and alert CVS: mechanical hum throughout  Resp: clear  Abd: soft, + drive line Ext: no LE edema,  ACCESS: R TDC c/d/i  Imaging: No results found.  Labs: BMET Recent Labs  Lab 11/10/20 0150 11/11/20 0415 11/12/20 0500 11/13/20 0340 11/14/20 0537 11/15/20 0543 11/16/20 0307  NA 134* 134* 136 136 135 134* 133*  K 5.2* 4.9 5.0 4.7 5.5* 5.3* 4.4  CL 96* 97* 96* 97* 98 97* 96*  CO2 26 28 29 28 27 23 27   GLUCOSE 174* 110* 135* 112* 117* 130* 111*  BUN 57* 39* 58* 35* 53* 66* 34*  CREATININE 6.09* 4.78* 6.43* 4.69* 6.63* 7.73* 4.63*  CALCIUM 8.9 9.1 9.3 9.2 9.5 9.2 8.9  PHOS 6.4* 4.7* 5.4* 4.2 6.6* 8.5* 5.3*   CBC Recent Labs  Lab 11/13/20 0340 11/14/20 0537 11/15/20 0543 11/16/20 0307  WBC 10.6* 10.0 9.9 9.5  HGB 8.9* 8.8* 8.5*  8.6*  HCT 29.8* 29.2* 28.4* 28.8*  MCV 92.0 90.4 91.6 90.9  PLT 258 248 244 231    Medications:    . (feeding supplement) PROSource Plus  30 mL Oral BID BM  . amiodarone  200 mg Oral Daily  . vitamin C  1,000 mg Oral Daily  . Chlorhexidine Gluconate Cloth  6 each Topical Q0600  . darbepoetin (ARANESP) injection - DIALYSIS  100 mcg Intravenous Q Tue-HD  . feeding supplement (NEPRO CARB STEADY)  237 mL Oral BID BM  . gabapentin  100 mg Oral QHS  . insulin aspart  0-15 Units Subcutaneous TID WC  . insulin aspart  0-5 Units Subcutaneous QHS  . insulin glargine  15 Units  Subcutaneous Daily  . metoCLOPramide  5 mg Oral TID AC  . midodrine  5 mg Oral TID WC  . multivitamin  1 tablet Oral QHS  . Muscle Rub   Topical BID  . pantoprazole  40 mg Oral Daily  . polyethylene glycol  17 g Oral QHS  . rosuvastatin  20 mg Oral Daily  . senna-docusate  2 tablet Oral BID  . Warfarin - Pharmacist Dosing Inpatient   Does not apply q1600      Otelia Santee, MD 11/16/2020, 8:19 AM

## 2020-11-16 NOTE — Progress Notes (Signed)
Patient ID: Marcus Mano., male   DOB: 12-Oct-1960, 60 y.o.   MRN: [id number]  SW met with pt in room to introduce self, explain role, provide updates from team conference, and ELOS 2 weeks. SW informed there will be updates on Tuesday after team conference to confirm a discharge date. Pt states he will return home with his son [name] and brother [name]. States his brother will be his primary caregiver if he requires physical assistance. Confirms that his son is primary contact to discuss his discharge care needs. SW discussed with pt if he spoke with apartment complex about ground level apartment, and he states he has been told there is not one available. Pt states there are 26 steps to their apartment. SW expressed concerns about physical ability to walk steps. Pt is concerned as well. Pt is hoping to regain strength, and get back to how he was so he can do for himself. SW also encouraged pt to use recliner, as well as dialysis RN. He states he did not use yesterday as he complained about being dizzy, and nursing determine it was not safe for him to be in a recliner at the time. Pt aware SW to follow-up with his son [name] to discuss above.  SW left message for [name] [phone]) to introduce self, explain role, and discuss discharge process. SW will continue to make efforts to make contact.  *SW received call from [place], and discussed above with regard to his father's current progress, and pt father will require dialysis at discharge as reported at this time. [name] is able to transport him to/from dialysis clinic as long as first shift since he works 4pm-12am. He states that his employer is flexible. SW discussed applying for [employer] [place] in the event transportation is needed. SW suggested pt applying for Medicaid. SW informed will provide application to pt. SW will follow-up with more updates after team conference on Tuesday.  Loralee Pacas, MSW, [place] Office: [phone] Cell: [phone] Fax:  [phone]
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
## 2020-11-17 DIAGNOSIS — I5022 Chronic systolic (congestive) heart failure: Secondary | ICD-10-CM | POA: Diagnosis not present

## 2020-11-17 DIAGNOSIS — Z95811 Presence of heart assist device: Secondary | ICD-10-CM | POA: Diagnosis not present

## 2020-11-17 DIAGNOSIS — R5381 Other malaise: Secondary | ICD-10-CM | POA: Diagnosis not present

## 2020-11-17 DIAGNOSIS — I951 Orthostatic hypotension: Secondary | ICD-10-CM | POA: Diagnosis not present

## 2020-11-17 LAB — RENAL FUNCTION PANEL
Albumin: 2.7 g/dL — ABNORMAL LOW (ref 3.5–5.0)
Anion gap: 11 (ref 5–15)
BUN: 50 mg/dL — ABNORMAL HIGH (ref 6–20)
CO2: 27 mmol/L (ref 22–32)
Calcium: 9 mg/dL (ref 8.9–10.3)
Chloride: 97 mmol/L — ABNORMAL LOW (ref 98–111)
Creatinine, Ser: 6.32 mg/dL — ABNORMAL HIGH (ref 0.61–1.24)
GFR, Estimated: 9 mL/min — ABNORMAL LOW (ref >60)
Glucose, Bld: 115 mg/dL — ABNORMAL HIGH (ref 70–99)
Phosphorus: 6.5 mg/dL — ABNORMAL HIGH (ref 2.5–4.6)
Potassium: 4.6 mmol/L (ref 3.5–5.1)
Sodium: 135 mmol/L (ref 135–145)

## 2020-11-17 LAB — CBC
HCT: 27.6 % — ABNORMAL LOW (ref 39.0–52.0)
Hemoglobin: 8.2 g/dL — ABNORMAL LOW (ref 13.0–17.0)
MCH: 26.5 pg (ref 26.0–34.0)
MCHC: 29.7 g/dL — ABNORMAL LOW (ref 30.0–36.0)
MCV: 89.3 fL (ref 80.0–100.0)
Platelets: 203 10*3/uL (ref 150–400)
RBC: 3.09 MIL/uL — ABNORMAL LOW (ref 4.22–5.81)
RDW: 17.2 % — ABNORMAL HIGH (ref 11.5–15.5)
WBC: 9.1 10*3/uL (ref 4.0–10.5)
nRBC: 0 % (ref 0.0–0.2)

## 2020-11-17 LAB — MAGNESIUM: Magnesium: 2.2 mg/dL (ref 1.7–2.4)

## 2020-11-17 LAB — GLUCOSE, CAPILLARY
Glucose-Capillary: 106 mg/dL — ABNORMAL HIGH (ref 70–99)
Glucose-Capillary: 157 mg/dL — ABNORMAL HIGH (ref 70–99)
Glucose-Capillary: 86 mg/dL (ref 70–99)
Glucose-Capillary: 110 mg/dL — ABNORMAL HIGH (ref 70–99)

## 2020-11-17 LAB — PROTIME-INR
INR: 1.9 — ABNORMAL HIGH (ref 0.8–1.2)
Prothrombin Time: 21.7 seconds — ABNORMAL HIGH (ref 11.4–15.2)

## 2020-11-17 LAB — BRAIN NATRIURETIC PEPTIDE: B Natriuretic Peptide: 630.2 pg/mL — ABNORMAL HIGH (ref 0.0–100.0)

## 2020-11-17 LAB — LACTATE DEHYDROGENASE: LDH: 190 U/L (ref 98–192)

## 2020-11-17 LAB — HEPARIN LEVEL (UNFRACTIONATED): Heparin Unfractionated: 0.2 IU/mL — ABNORMAL LOW (ref 0.30–0.70)

## 2020-11-17 MED ORDER — WARFARIN SODIUM 3 MG PO TABS
3.0000 mg | ORAL_TABLET | Freq: Once | ORAL | Status: AC
  Administered 2020-11-17: 3 mg via ORAL
  Filled 2020-11-17: qty 1

## 2020-11-17 NOTE — Progress Notes (Addendum)
Patient ID: Marcus Johnson., male   DOB: 02/27/1961, 60 y.o.   MRN: [id number]     Advanced Heart Failure Rounding Note  PCP-Cardiologist: No primary care provider on file.   Subjective:    - 2/27 Milrinone switched to dobutamine 5 mcg.  - 2/28 Moved to ICU. Dobutamine increased to 7.5 mcg and Norepi 3 mcg added. Diuretics held.  - 3/1 Impella 5.5 placed.  - 3/4 Teeth removed - 3/10 HM3 LVAD placed - 3/13 VAD speed increased to 5500. [name] out  - 3/14 Nephrology consulted. Given 1UPRBCs  - 3/15 Started CRRT. Given 1UPRBCs.  - 3/23 VAD Speed turned down to 5300  - 3/23 CRRT and Milrinone Stopped. Back in Afib - 3/24 CRRT restarted.  - 3/27 CVVH off.  - 3/28 iHD - 3/29 iHD. Walked 75 feet!! - 3/20 Ramp Echo: Fixed speed: 5200 Low speed limit: 4900 - 4/3 S/P RIJ HD catheter.  - 4/10 60th B-day!  - 4/11 Transferred to CIR. Back on heparin drip.   Working with PT, had a vertigo-type spell when he turned his head a particular way.  INR 1.9 today.   LVAD Interrogation HM 3: Speed: 5200 Flow: 4 PI: 5.4 Power: 3.8.  VAD interrogated personally. Parameters stable.  LDH 190 INR 1.9  Objective:   Weight Range: 88 kg Body mass index is 27.44 kg/m.   Vital Signs:   Temp:  [97.9 F (36.6 C)-98.6 F (37 C)] 97.9 F (36.6 C) (04/14 1040) Pulse Rate:  [82-112] 85 (04/14 1040) Resp:  [15-20] 16 (04/14 1040) BP: (64-112)/(33-99) 105/87 (04/14 1040) SpO2:  [97 %-100 %] 100 % (04/14 1040) Weight:  [88 kg] 88 kg (04/14 0500) Last BM Date: 11/16/20  Weight change: Filed Weights   11/16/20 1217 11/17/20 0500  Weight: 86.6 kg 88 kg    Intake/Output:   Intake/Output Summary (Last 24 hours) at 11/17/2020 1318 Last data filed at 11/17/2020 1253 Gross per 24 hour  Intake 1271.2 ml  Output --  Net 1271.2 ml      Physical Exam   General: Well appearing this am. NAD.  HEENT: Normal. Neck: Supple, JVP 7-8 cm. Carotids OK.  Cardiac:  Mechanical heart sounds with LVAD hum  present.  Lungs:  CTAB, normal effort.  Abdomen:  NT, ND, no HSM. No bruits or masses. +BS  LVAD exit site: Well-healed and incorporated. Dressing dry and intact. No erythema or drainage. Stabilization device present and accurately applied. Driveline dressing changed daily per sterile technique. Extremities:  Warm and dry. No cyanosis, clubbing, rash, or edema.  Neuro:  Alert & oriented x 3. Cranial nerves grossly intact. Moves all 4 extremities w/o difficulty. Affect pleasant    Skin: sacral decubitus  Labs    CBC Recent Labs    11/16/20 0307 11/17/20 0411  WBC 9.5 9.1  HGB 8.6* 8.2*  HCT 28.8* 27.6*  MCV 90.9 89.3  PLT 231 242   Basic Metabolic Panel Recent Labs    11/16/20 0307 11/17/20 0411  NA 133* 135  K 4.4 4.6  CL 96* 97*  CO2 27 27  GLUCOSE 111* 115*  BUN 34* 50*  CREATININE 4.63* 6.32*  CALCIUM 8.9 9.0  MG 2.2 2.2  PHOS 5.3* 6.5*   Liver Function Tests Recent Labs    11/16/20 0307 11/17/20 0411  ALBUMIN 2.7* 2.7*   No results for input(s): LIPASE, AMYLASE in the last 72 hours. Cardiac Enzymes No results for input(s): CKTOTAL, CKMB, CKMBINDEX, TROPONINI in the last 72  hours.  BNP: BNP (last 3 results) Recent Labs    11/02/20 2310 11/10/20 0150 11/17/20 0411  BNP 486.0* 905.1* 630.2*    ProBNP (last 3 results) No results for input(s): PROBNP in the last 8760 hours.   D-Dimer No results for input(s): DDIMER in the last 72 hours. Hemoglobin A1C No results for input(s): HGBA1C in the last 72 hours. Fasting Lipid Panel No results for input(s): CHOL, HDL, LDLCALC, TRIG, CHOLHDL, LDLDIRECT in the last 72 hours. Thyroid Function Tests No results for input(s): TSH, T4TOTAL, T3FREE, THYROIDAB in the last 72 hours.  Invalid input(s): FREET3  Other results:   Imaging    No results found.   Medications:     Scheduled Medications: . (feeding supplement) PROSource Plus  30 mL Oral BID BM  . amiodarone  200 mg Oral Daily  . vitamin C   1,000 mg Oral Daily  . Chlorhexidine Gluconate Cloth  6 each Topical Q0600  . darbepoetin (ARANESP) injection - DIALYSIS  100 mcg Intravenous Q Tue-HD  . feeding supplement (NEPRO CARB STEADY)  237 mL Oral BID BM  . gabapentin  100 mg Oral QHS  . insulin aspart  0-15 Units Subcutaneous TID WC  . insulin aspart  0-5 Units Subcutaneous QHS  . insulin glargine  15 Units Subcutaneous Daily  . midodrine  5 mg Oral TID WC  . multivitamin  1 tablet Oral QHS  . Muscle Rub   Topical BID  . pantoprazole  40 mg Oral Daily  . polyethylene glycol  17 g Oral QHS  . rosuvastatin  20 mg Oral Daily  . senna-docusate  2 tablet Oral BID  . warfarin  3 mg Oral ONCE-1600  . Warfarin - Pharmacist Dosing Inpatient   Does not apply q1600    Infusions: . sodium chloride      PRN Medications: sodium chloride, acetaminophen, alteplase, alum & mag hydroxide-simeth, bisacodyl, dextrose, diphenhydrAMINE, guaiFENesin-dextromethorphan, heparin, lidocaine-prilocaine, ondansetron (ZOFRAN) IV, oxyCODONE, pentafluoroprop-tetrafluoroeth, prochlorperazine **OR** prochlorperazine **OR** prochlorperazine, sodium chloride flush, sodium phosphate, traZODone, white petrolatum    Assessment/Plan   1.Acute on chronic systolic HF -> cardiogenic shock:ICM.HasMedtronic ICD. EF 15%. Low output persisted despite milrinone and then dobutamine with rise in creatinine to 2.5. Impella 5.5 placed 3/1.  Creatinine improved to 1.7 and HM3 LVAD was placed on 3/10. Developed post-op renal failure requiring CVVH. Now off milrinone and epinephrine. Last echo showed moderate RV enlargement with mildly decreased RV function and VAD speed decreased to 5300 due to left-sided septum. CRRT stopped 3/23 but restarted 3/24. CVVH stopped again on 3/27. Ramp echo 3/30 with speed decreased to 5200. Tolerating iHD for volume management.     - Continue midodrine to 5 tid.   - Off sildenafil with orthostasis.  - Continue compression stockings.  - HD  for volume management,will go today. 2. LVAD: 3/13 VAD speed increased to 5500. 3/23 speed decreased to 5300 and decreased to 5200 on 3/30. VAD interrogated personally. LDH 190, INR 1.9 today.  - On warfarin, on ASA 81 daily.  - Stop heparin gtt today.    - DL site ok 3. CAD s/p CABG2002:Last cath in 6/19 with patent grafts. - Continue Crestor 40 mg daily.  4. AKI on CKD Stage 3b: Suspect that this is a combination of cardiorenal syndrome and diabetic nephropathy and possibly ATN. Developed post-op AKI. Started on CRRT 3/15. CVVH stopped 3/23 but restarted 3/24. Now getting iHD, looks like he will be HD-dependent.  Has tunneled catheter.  - HD T/Th/S -  Dr Augustin Coupe to talk with VVS, but with continuous flow LVAD, think AV fistula would be less likely to mature.  Will await decision by VVS and nephrology in terms of access.  5. Atrial flutter/fibrillation: s/p DC-CV 08/21/18.  - Continue amio 200 mg daily.   6. Wound R Foot: Partial thickness skin loss noted. Cover with mepilex border and change every 5 days. Resolved.  7. Right arm pain: Likely neuropathic from stretch of brachial plexus with Impella placement.  8. ID:  WBCs normal. Afebrile. Blood Cx- NGTD.  9. AX:ENMMHWK of very poor control but hgbA1ctrending down. Most recent 7.7%.  Insulin drip stopped 3/24. Blood glucose controlled on reduced Lantus and  sliding scale insulin to moderate.   - Diabetes coordinators have seen 10. Anemia: Has been stable.  11. Deconditioning - Rehab Team appreciated.  12. Unstageable Pressure Ulcer on buttocks: Improving.  - On hydrotherapy Mon-Sat.  - Continue reposition every 2 hours side lying position. Reposition frequently in the chair. 13. NSVT: No longer on the monitor.  14. Dizziness: This seems more due to positional vertigo than orthostasis.  Has had Epley maneuvers.    Loralie Champagne 11/17/2020 1:18 PM

## 2020-11-17 NOTE — Progress Notes (Signed)
ANTICOAGULATION CONSULT NOTE - Follow Up Consult  Pharmacy Consult for Warfarin Indication: LVAD  Allergies  Allergen Reactions  . Meclizine Hcl Anaphylaxis and Swelling  . Ivabradine Nausea Only    Patient Measurements: Height: 5' 10.5" (179.1 cm) Weight: 88 kg (194 lb) IBW/kg (Calculated) : 74.15 Heparin Dosing Weight: 89.2 kg  Vital Signs: Temp: 98.3 F (36.8 C) (04/14 0622) Temp Source: Oral (04/14 0238) BP: 112/99 (04/14 0622) Pulse Rate: 83 (04/14 0622)  Labs: Recent Labs    11/14/20 2000 11/15/20 0543 11/15/20 0543 11/15/20 0810 11/16/20 0307 11/16/20 0337 11/17/20 0411  HGB  --  8.5*   < >  --  8.6*  --  8.2*  HCT  --  28.4*  --   --  28.8*  --  27.6*  PLT  --  244  --   --  231  --  203  LABPROT  --  17.9*  --   --  19.9*  --  21.7*  INR  --  1.5*  --   --  1.7*  --  1.9*  HEPARINUNFRC <0.10*  --   --  0.10*  --  0.11*  --   CREATININE  --  7.73*  --   --  4.63*  --  6.32*   < > = values in this interval not displayed.    Estimated Creatinine Clearance: 13 mL/min (A) (by C-G formula based on SCr of 6.32 mg/dL (H)).   Medical History: Past Medical History:  Diagnosis Date  . AICD (automatic cardioverter/defibrillator) present    Single-chamber  implantable cardiac defibrillator - Medtronic  . Atrial fibrillation ([place])   . Cataract    Mixed form OU  . CHF (congestive heart failure) ([place])   . Chronic kidney disease   . Chronic kidney disease (CKD), stage III (moderate) (HCC)   . Chronic systolic heart failure ([place])    a. Echo 5/13: Mild LVE, mild LVH, EF 10%, anteroseptal, lateral, apical AK, mild MR, mild LAE, moderately reduced RVSF, mild RAE, PASP 60;  b. 07/2014 Echo: EF 20-2%, diff HK, AKI of antsep/apical/mid-apicalinferior, mod reduced RV.  Marland Kitchen Coronary artery disease    a. s/p CABG 2002 b. LHC 5/13:  dLM 80%, LAD subtotally occluded, pCFX occluded, pRCA 50%, mid? Occlusion with high take off of the PDA with 70% multiple lesions-not bypassed and  supplies collaterals to LAD, LIMA-IM/ramus ok, S-OM ok, S-PLA branches ok. Medical therapy was recommended  . COVID   . Diabetic retinopathy (HCC)    NPDR OD, PDR OS  . Dyspnea   . Gout    "on daily RX" (01/08/2018)  . Hypertension   . Hypertensive retinopathy    OU  . Ischemic cardiomyopathy    a. Echo 5/13: Mild LVE, mild LVH, EF 10%, anteroseptal, lateral, apical AK, mild MR, mild LAE, moderately reduced RVSF, mild RAE, PASP 60;  b. 01/2012 s/p MDT [id number] Protecta XT VR AICD;  c. 07/2014 Echo: EF 20-2%, diff HK, AKI of antsep/apical/mid-apicalinferior, mod reduced RV.  Marland Kitchen MRSA (methicillin resistant Staphylococcus aureus)    Status post right foot plantar deep infection with MRSA status post  I&D 10/2008  . Myocardial infarction [facility])    "was told I'd had several before heart OR in 2002" (01/08/2018)  . Noncompliance   . Peripheral neuropathy   . Retinopathy, diabetic, background ([place])   . Syncope   . Type II diabetes mellitus ([place])    requiring insulin   . Vitreous hemorrhage, left (HCC)  and proliferative diabetic retinopathy    Assessment: 60 yo M s/p new LVAD implant with HeartMate III on 3/10. Warfarin started per MD on 3/12, and pharmacy asked to take over warfarin dosing.   INR 1.9 - very sensitive and has been > goal but then dramatic drop - still just below goal  He is drinking Ensure daily and eating well - per patient report. CBC stable with Hgb 8s, pltc 200s. LDH stable 190  Goal of Therapy: heparin level < 0.3  INR 2-2.5 Monitor platelets by anticoagulation protocol: Yes   Plan:  Warfarin 3 mg x 1 again tonight. INR 1.9 today > will stop IV heparin Daily PT/INR, CBC, monitor s/sx bleeding  [place] Crane, Vena Austria, BCPS, [facility] Clinical Pharmacist  11/17/2020 7:37 AM   [facility] pharmacy phone numbers are listed on [email or web address]

## 2020-11-17 NOTE — Progress Notes (Signed)
Occupational Therapy Session Note  Patient Details  Name: Marcus Johnson. MRN: [id number] Date of Birth: 1960/11/27  Today's Date: 11/17/2020 OT Individual Time: 4142-3953 OT Individual Time Calculation (min): 75 min    Today's Date: 11/17/2020 OT Individual Time: 1030-1100 OT Individual Time Calculation (min): 30 min   Short Term Goals: Week 1:  OT Short Term Goal 1 (Week 1): Pt will don pants with min A OT Short Term Goal 2 (Week 1): Pt will tolerate standing at the sink for ADL with no c/o light headness OT Short Term Goal 3 (Week 1): Pt will complete toilet transfer with min A  Skilled Therapeutic Interventions/Progress Updates:    1:1. Pt received in bed agreeable to OT. Pt with improved tremulous movements this date at EOB. OT dons teds for BP support and non skid socks. Pt sup>sit EOB after RN delivers meds for pain/BP. Pt washes UB at EOB and dons new gown with MAX A. Pt delcines changing pants. Pt reporting 3/10 dizziness seated EOB and remains there ~30 min with supervisoin and minimal fading to no shaking at EOB. Pt washes with VC for ringing wash cloth out with both hands. Pt HD physcian comes in to discuss fistula placement/consultation from vascular. Pt reporting increased dizziness after "stressful" conversation. OT writes down questions on white board for pt to ask vascular when they come to do the consultation as compensatory strategy for any "forgetfulness" pt reports having. Pt requesting to shave face and returns to supine with MAX A for trunk management. No electric razor present therefore OT shaves face for safety d/t blood thinner. Exited session with pt seated in bed, exit alarm on and call light in reach    Session 2: Pt received in bed agreeable to OT. OT provides PROM of R shoulder but demo signifiant ER tightness. OT reviews self ROM of chest press, straight arm raise and int/ext with hands clasped to maintain sternal precautions. Pt able to return demo. OT  writes ex on the board for pt to complete 3x/day 5-10 reps. Exited session with pt seated in bed, exit alarm on and call light in reach   Therapy Documentation Precautions:  Precautions Precautions: Fall,Sternal,Other (comment) Precaution Comments: LVAD, sacral wound, Left BPPV Required Braces or Orthoses: Other Brace Other Brace: thumb loop and buddy strap R hand Restrictions Weight Bearing Restrictions: Yes RUE Weight Bearing: Partial weight bearing Other Position/Activity Restrictions: sternal precautions General:   Vital Signs: Therapy Vitals Temp: 98.3 F (36.8 C) Pulse Rate: 83 Resp: 15 BP: (!) 112/99 Patient Position (if appropriate): Lying Oxygen Therapy SpO2: 97 % O2 Device: Room Air Pain:   ADL: ADL Eating: Set up Where Assessed-Eating: Bed level Grooming: Minimal assistance Where Assessed-Grooming: Edge of bed Upper Body Bathing: Unable to assess Lower Body Bathing: Unable to assess Upper Body Dressing: Unable to assess Lower Body Dressing: Maximal assistance Toileting: Unable to assess Toilet Transfer: Unable to assess Tub/Shower Transfer Method: Unable to assess Gaffer Transfer: Unable to assess Vision   Perception    Praxis   Exercises:   Other Treatments:     Therapy/Group: Individual Therapy  Tonny Branch 11/17/2020, 6:50 AM

## 2020-11-17 NOTE — IPOC Note (Signed)
Overall Plan of Care (IPOC) Patient Details Name: Marcus Johnson. MRN: [id number] DOB: 01-17-1961  Admitting Diagnosis: Debility  Hospital Problems: Principal Problem:   Debility     Functional Problem List: Nursing Behavior,Edema,Endurance,Medication Management,Pain,Safety,Skin Integrity  PT Balance,Behavior,Edema,Endurance,Motor,Nutrition,Skin Integrity,Sensory,Safety,Perception,Pain  OT Balance,Safety,Cognition,Sensory,Edema,Skin Integrity,Endurance,Vision,Motor,Pain  SLP    TR         Basic ADL's: OT Grooming,Eating,Bathing,Dressing,Toileting     Advanced  ADL's: OT       Transfers: PT Bed Mobility,Car,Bed to Chair,Furniture  OT Toilet     Locomotion: PT Ambulation,Wheelchair Mobility,Stairs     Additional Impairments: OT Fuctional Use of Upper Extremity  SLP        TR      Anticipated Outcomes Item Anticipated Outcome  Self Feeding supervision  Swallowing      Basic self-care  supervision  Toileting  supervision   Bathroom Transfers supervision  Bowel/Bladder  Mod I  Transfers  supervision using LRAD  Locomotion  supervison >150 ft using LRAD  Communication     Cognition     Pain  <4  Safety/Judgment  Mod I with no falls   Therapy Plan: PT Intensity: Minimum of 1-2 x/day ,45 to 90 minutes PT Frequency: 5 out of 7 days PT Duration Estimated Length of Stay: 2 weeks OT Intensity: Minimum of 1-2 x/day, 45 to 90 minutes OT Frequency: 5 out of 7 days OT Duration/Estimated Length of Stay: 2 weeks     Due to the current state of emergency, patients may not be receiving their 3-hours of Medicare-mandated therapy.   Team Interventions: Nursing Interventions Patient/Family Education,Disease Management/Prevention,Pain Management,Medication Management,Skin Care/Wound Management,Discharge Planning,Psychosocial Support  PT interventions Ambulation/gait training,Cognitive remediation/compensation,Discharge planning,DME/adaptive equipment  instruction,Functional mobility training,Pain management,Psychosocial support,Splinting/orthotics,Therapeutic Activities,UE/LE Strength taining/ROM,Wheelchair propulsion/positioning,UE/LE Coordination activities,Therapeutic Exercise,Stair training,Skin care/wound management,Patient/family education,Neuromuscular re-education,Visual/perceptual remediation/compensation,Functional Insurance risk surveyor  OT Interventions Balance/vestibular training,Discharge planning,Pain management,Self Care/advanced ADL retraining,Therapeutic Activities,Cognitive remediation/compensation,Disease mangement/prevention,Patient/family education,Functional mobility training,Skin care/wound managment,Therapeutic Exercise,Visual/perceptual remediation/compensation,Community Corporate treasurer re-education,Psychosocial support,UE/LE Strength taining/ROM,UE/LE Museum/gallery conservator propulsion/positioning  SLP Interventions    TR Interventions    SW/CM Interventions Discharge Planning,Psychosocial Support,Patient/Family Education   Barriers to Discharge MD  Medical stability  Nursing Decreased caregiver support,Home environment access/layout,Wound Care,Lack of/limited family support,Weight,Hemodialysis,Weight bearing restrictions,Medication compliance,Behavior    PT Home environment access/layout,Inaccessible home environment,Hemodialysis,Behavior    OT Inaccessible home environment pt lives in 3rd floor apt  SLP      SW Decreased caregiver support,Lack of/limited family support,Hemodialysis     Team Discharge Planning: Destination: PT-Home ,OT- Home , SLP-  Projected Follow-up: PT-Home health PT, OT-  Home health OT, SLP-  Projected Equipment Needs: PT-To be determined, OT- To be determined, SLP-  Equipment Details: PT- , OT-  Patient/family involved in discharge planning: PT-  Patient,  OT-Patient, SLP-   MD ELOS: 2wks + Medical Rehab Prognosis:  Good Assessment: The patient has been admitted for CIR therapies with the diagnosis of debility related to [facility], LVAD placement. The team will be addressing functional mobility, strength, stamina, balance, safety, adaptive techniques and equipment, self-care, bowel and bladder mgt, patient and caregiver education, pain mgt, LVAD mgt, community reentry, navigation of stairs. Goals have been set at supervision for mobility and self-care.   Due to the current state of emergency, patients may not be receiving their 3 hours per day of Medicare-mandated therapy.    Meredith Staggers, MD, FAAPMR      See Team Conference Notes for weekly updates to the plan of care

## 2020-11-17 NOTE — Progress Notes (Signed)
Patient tolerated full treatment of dialysis in chair today and delay in transport to get back to room.

## 2020-11-17 NOTE — Progress Notes (Signed)
PROGRESS NOTE   Subjective/Complaints: Restless night as bp's were being checked frequently after drop yesterday afternoon. C/o frequency of reglan  ROS: Patient denies fever, rash, sore throat, blurred vision, nausea, vomiting, diarrhea, cough, shortness of breath or chest pain, joint or back pain, headache, or mood change.   Objective:   No results found. Recent Labs    11/16/20 0307 11/17/20 0411  WBC 9.5 9.1  HGB 8.6* 8.2*  HCT 28.8* 27.6*  PLT 231 203   Recent Labs    11/16/20 0307 11/17/20 0411  NA 133* 135  K 4.4 4.6  CL 96* 97*  CO2 27 27  GLUCOSE 111* 115*  BUN 34* 50*  CREATININE 4.63* 6.32*  CALCIUM 8.9 9.0    Intake/Output Summary (Last 24 hours) at 11/17/2020 1008 Last data filed at 11/17/2020 2671 Gross per 24 hour  Intake 1151.2 ml  Output 1 ml  Net 1150.2 ml     Pressure Injury 10/16/20 Coccyx Left Stage 2 -  Partial thickness loss of dermis presenting as a shallow open injury with a red, pink wound bed without slough. Blister that has drained, 3.5 cm X 3.5 in size (Active)  10/16/20 0800  Location: Coccyx  Location Orientation: Left  Staging: Stage 2 -  Partial thickness loss of dermis presenting as a shallow open injury with a red, pink wound bed without slough.  Wound Description (Comments): Blister that has drained, 3.5 cm X 3.5 in size  Present on Admission: No     Pressure Injury 10/16/20 Coccyx Mid Stage 2 -  Partial thickness loss of dermis presenting as a shallow open injury with a red, pink wound bed without slough. Blister that has drained, 2 cm X 4.5 cm (Active)  10/16/20 0800  Location: Coccyx  Location Orientation: Mid  Staging: Stage 2 -  Partial thickness loss of dermis presenting as a shallow open injury with a red, pink wound bed without slough.  Wound Description (Comments): Blister that has drained, 2 cm X 4.5 cm  Present on Admission: No     Pressure Injury 10/16/20  Coccyx Right Stage 2 -  Partial thickness loss of dermis presenting as a shallow open injury with a red, pink wound bed without slough. blister that has drained, 0.5 cm X 0.5 cm (Active)  10/16/20 0800  Location: Coccyx  Location Orientation: Right  Staging: Stage 2 -  Partial thickness loss of dermis presenting as a shallow open injury with a red, pink wound bed without slough.  Wound Description (Comments): blister that has drained, 0.5 cm X 0.5 cm  Present on Admission: No    Physical Exam: Vital Signs Blood pressure (!) 112/99, pulse 83, temperature 98.3 F (36.8 C), resp. rate 15, height 5' 10.5" (1.791 m), weight 88 kg, SpO2 97 %.  Constitutional: No distress . Vital signs reviewed. HEENT: EOMI, oral membranes moist Neck: supple Cardiovascular: RRR without murmur. No JVD    Respiratory/Chest: CTA Bilaterally without wheezes or rales. Normal effort    GI/Abdomen: BS +, non-tender, non-distended Ext: no clubbing, cyanosis, or edema Psych: pleasant and cooperative Skin: tunneled cath right shoulder in place. Right foot intact. Sacral wound 4-5 cm and fairly  superficial with predominant granulation tissue--stable. Neuro: Pt is cognitively appropriate with normal insight, memory, and awareness. Cranial nerves 2-12 are intact. Sensory exam is normal. Reflexes are 2+ in all 4's. Fine motor coordination is intact. No tremors. Motor function is grossly 4/5 with tremor. RUE  Remains 3-4/5 but still limited d/t pain in shoulder. has scattered sensory loss throughout right arm. LE's 3/5 Musculoskeletal: Right shoulder tender with PROM/AROM      Assessment/Plan: 1. Functional deficits which require 3+ hours per day of interdisciplinary therapy in a comprehensive inpatient rehab setting.  Physiatrist is providing close team supervision and 24 hour management of active medical problems listed below.  Physiatrist and rehab team continue to assess barriers to discharge/monitor patient progress  toward functional and medical goals  Care Tool:  Bathing  Bathing activity did not occur: Safety/medical concerns (time constraints) Body parts bathed by patient: Left arm,Abdomen,Chest,Front perineal area,Right upper leg,Left upper leg,Face   Body parts bathed by helper: Right arm,Buttocks,Left lower leg,Right lower leg     Bathing assist Assist Level: Moderate Assistance - Patient 50 - 74%     Upper Body Dressing/Undressing Upper body dressing Upper body dressing/undressing activity did not occur (including orthotics): Environmental limitations (PICC running) What is the patient wearing?: Hospital gown only    Upper body assist Assist Level: Moderate Assistance - Patient 50 - 74%    Lower Body Dressing/Undressing Lower body dressing      What is the patient wearing?: Incontinence brief     Lower body assist Assist for lower body dressing: Moderate Assistance - Patient 50 - 74%     Toileting Toileting Toileting Activity did not occur Landscape architect and hygiene only): N/A (no void or bm)  Toileting assist Assist for toileting: Dependent - Patient 0%     Transfers Chair/bed transfer  Transfers assist  Chair/bed transfer activity did not occur: Safety/medical concerns  Chair/bed transfer assist level: Minimal Assistance - Patient > 75%     Locomotion Ambulation   Ambulation assist   Ambulation activity did not occur: Safety/medical concerns  Assist level: Contact Guard/Touching assist Assistive device: Walker-rolling Max distance: 43 ft   Walk 10 feet activity   Assist  Walk 10 feet activity did not occur: Safety/medical concerns  Assist level: Contact Guard/Touching assist Assistive device: Walker-rolling   Walk 50 feet activity   Assist Walk 50 feet with 2 turns activity did not occur: Safety/medical concerns         Walk 150 feet activity   Assist Walk 150 feet activity did not occur: Safety/medical concerns         Walk 10  feet on uneven surface  activity   Assist Walk 10 feet on uneven surfaces activity did not occur: Safety/medical concerns         Wheelchair     Assist     Wheelchair activity did not occur: Safety/medical concerns         Wheelchair 50 feet with 2 turns activity    Assist    Wheelchair 50 feet with 2 turns activity did not occur: Safety/medical concerns       Wheelchair 150 feet activity     Assist  Wheelchair 150 feet activity did not occur: Safety/medical concerns       Blood pressure (!) 112/99, pulse 83, temperature 98.3 F (36.8 C), resp. rate 15, height 5' 10.5" (1.791 m), weight 88 kg, SpO2 97 %.  Medical Problem List and Plan: 1.Functional and mobility deficitssecondary to debility after acute  on chronic CHF with subsequent LVAD placement -patient maynotshower -ELOS/Goals: ?2 weeks+   --Continue CIR therapies including PT, OT to tolerance 2. Antithrombotics: -DVT/anticoagulation:Pharmaceutical:Coumadin now therapeutic -antiplatelet therapy: ASA 3.Right arm neuropathic pain/brachial plexus injury?/Pain Management:Tylenol or oxycodone prn.  --Increased gabapentin to 200 mgat bedtime with some benefit -sling for RUE support and comfort  -I believe there is rotator cuff involvement on right---added muscle rub to shoulder.   -ROM with therapy  4. Mood:LCSW to follow for evaluation and support. -antipsychotic agents: N/A 5. Neuropsych: This patientiscapable of making decisions on hisown behalf. 6. Skin/Wound Care:Optimize nutritional status--continue Nephro and prosource.  --Sacral decubitus: Air mattress.    -appears much improved-->WTD dressing   -pt is NO LONGER RECEIVING HYDRO --Right foot wound resolved.  7. Fluids/Electrolytes/Nutrition:Strict I/O. Continue to monitor labs with HD? 8. Chronic systolic CHF s/p LVAD 3:   --Cardiology to follow and manage cardiac issues.  -daily weights Filed Weights   11/16/20 1217 11/17/20 0500  Weight: 86.6 kg 88 kg    4/13 midodrine adjusted to 5mg  TID daily  4/14 bp/volume status per nephro and cards.    -off revatio 9. Acute on chronic anemia: Continue to monitor H/H/ monitor for signs of bleeding.  --Was transfused with multiple units PRBC. --ON Nulecit Tu/Th/Sa  -transfuse as needed in HD 10. T2DM: Hgb A1c- monitor BS ac/hs.BS reasonable on current intake --continue Lantus 15 units/am with SSI for elevated BS.   4/14 reasonable control CBG (last 3)  Recent Labs    11/16/20 1644 11/16/20 2109 11/17/20 0602  GLUCAP 147* 133* 106*    11. ESRD: Hyperkalemia being managed with lokelma/HD. -On regular diet w/o FR --question due to intake. Phosphorous levels trending up.  --Scheduled HD at the end of the day to help with tolerance of activity during the day. --RD consulted for diet education (multiple restrictions) 12. Mild leucocytosis: Resolving--no current signs of infection.  13. A fib: NSR, HR remains controlled on Amiodarone. 14. Nausea: improved  will hold reglan per pt request    LOS: 3 days A FACE TO [place] 11/17/2020, 10:08 AM

## 2020-11-17 NOTE — Progress Notes (Signed)
ANTICOAGULATION CONSULT NOTE - Follow Up Consult  Pharmacy Consult for Warfarin Indication: LVAD  Allergies  Allergen Reactions  . Meclizine Hcl Anaphylaxis and Swelling  . Ivabradine Nausea Only    Patient Measurements: Height: 5' 10.5" (179.1 cm) Weight: 88 kg (194 lb) IBW/kg (Calculated) : 74.15 Heparin Dosing Weight: 89.2 kg  Vital Signs: Temp: 97.9 F (36.6 C) (04/14 1040) Temp Source: Oral (04/14 1040) BP: 105/87 (04/14 1040) Pulse Rate: 85 (04/14 1040)  Labs: Recent Labs    11/15/20 0543 11/15/20 0810 11/16/20 0307 11/16/20 0337 11/17/20 0411 11/17/20 0811  HGB 8.5*  --  8.6*  --  8.2*  --   HCT 28.4*  --  28.8*  --  27.6*  --   PLT 244  --  231  --  203  --   LABPROT 17.9*  --  19.9*  --  21.7*  --   INR 1.5*  --  1.7*  --  1.9*  --   HEPARINUNFRC  --  0.10*  --  0.11*  --  0.20*  CREATININE 7.73*  --  4.63*  --  6.32*  --     Estimated Creatinine Clearance: 13 mL/min (A) (by C-G formula based on SCr of 6.32 mg/dL (H)).   Medical History: Past Medical History:  Diagnosis Date  . AICD (automatic cardioverter/defibrillator) present    Single-chamber  implantable cardiac defibrillator - [employer]  . Atrial fibrillation ([place])   . Cataract    Mixed form OU  . CHF (congestive heart failure) ([place])   . Chronic kidney disease   . Chronic kidney disease (CKD), stage III (moderate) (HCC)   . Chronic systolic heart failure ([place])    a. Echo 5/13: Mild LVE, mild LVH, EF 10%, anteroseptal, lateral, apical AK, mild MR, mild LAE, moderately reduced RVSF, mild RAE, PASP 60;  b. 07/2014 Echo: EF 20-2%, diff HK, AKI of antsep/apical/mid-apicalinferior, mod reduced RV.  Marland Kitchen Coronary artery disease    a. s/p CABG 2002 b. LHC 5/13:  dLM 80%, LAD subtotally occluded, pCFX occluded, pRCA 50%, mid? Occlusion with high take off of the PDA with 70% multiple lesions-not bypassed and supplies collaterals to LAD, LIMA-IM/ramus ok, S-OM ok, S-PLA branches ok. Medical therapy was  recommended  . COVID   . Diabetic retinopathy (HCC)    NPDR OD, PDR OS  . Dyspnea   . Gout    "on daily RX" (01/08/2018)  . Hypertension   . Hypertensive retinopathy    OU  . Ischemic cardiomyopathy    a. Echo 5/13: Mild LVE, mild LVH, EF 10%, anteroseptal, lateral, apical AK, mild MR, mild LAE, moderately reduced RVSF, mild RAE, PASP 60;  b. 01/2012 s/p MDT [id number] Protecta XT VR AICD;  c. 07/2014 Echo: EF 20-2%, diff HK, AKI of antsep/apical/mid-apicalinferior, mod reduced RV.  Marland Kitchen MRSA (methicillin resistant Staphylococcus aureus)    Status post right foot plantar deep infection with MRSA status post  I&D 10/2008  . Myocardial infarction [facility])    "was told I'd had several before heart OR in 2002" (01/08/2018)  . Noncompliance   . Peripheral neuropathy   . Retinopathy, diabetic, background ([place])   . Syncope   . Type II diabetes mellitus ([place])    requiring insulin   . Vitreous hemorrhage, left (HCC)    and proliferative diabetic retinopathy    Assessment: 60 yo M s/p new LVAD implant with HeartMate III on 3/10. Warfarin started per MD on 3/12, and pharmacy asked to take over warfarin  dosing.   INR 1.9 - very sensitive and has been > goal but then dramatic drop - still just below goal  He is drinking Ensure daily and eating well - per patient report. CBC stable with Hgb 8s, pltc 200s. LDH stable 190  Goal of Therapy: heparin level < 0.3  INR 2-2.5 Monitor platelets by anticoagulation protocol: Yes   Plan:  Warfarin 3 mg x 1 again tonight. INR 1.9 today > will stop IV heparin Daily PT/INR, CBC, monitor s/sx bleeding  Discussed with Gerri Lins, VVS PA, planning HD access likely early next week.  Will continue Coumadin for now.  Plan to hold this weekend, and will bridge with heparin once INR < 1.8.  VVS would prefer INR </= 1.5 prior to procedure.  [place] Crane, Roylene Reason, BCCP Clinical Pharmacist  11/17/2020 1:26 PM   [facility] pharmacy phone numbers are listed on  [email or web address]

## 2020-11-17 NOTE — Progress Notes (Signed)
Dialysis chair brought up for pt. Called Freda Munro in [place], nurse not available at this time. Informed that pt unable to tolerate chair at this time. Has wounds to buttocks and even with pressure cushion pt unable to tolerate. Pt also reporting intermittent dizziness. Will continue to try and acclimate pt for clip,

## 2020-11-17 NOTE — Progress Notes (Signed)
LVAD Coordinator Rounding Note:  HM III LVAD implanted on 10/13/20 by Dr Cyndia Bent under Destination Therapy criteria.  Discharged from [facility] and admitted to [facility] 11/14/20.  Pt lying in bed awake, lying in bed. Reports his dialysis is late today. He says he has been very busy throughout the day, and continues to work hard.   Pt has HD dialysis scheduled on Tues/Thurs/Sat.   Vital signs: Temp: 97.8 HR:  95 Doppler:  Not documented Auto cuff:  112/99 (105) O2 Sat: 99% on RA Wt: 183.6>190.9>194 lbs    LVAD interrogation reveals:  Speed: 5200 Flow: 4.2 Power: 3.8w PI: 4.5 Hematocrit: 29  Alarms: none Events: none  Fixed speed: 5200 Low speed limit: 4900  Drive Line:  Existing VAD dressing C/DI with anchor intact and accurately applied. Continue Monday, Wednesday, Friday dressing changes by VAD coordinator. Next dressing change will be due 11/18/20. No family available for dressing change.   Labs:  LDH trend: 199>191>196>190  INR trend: 1.5>1.7>1.9>1.9  Anticoagulation Plan: -INR Goal: 2.0-2.5 -ASA Dose: stopped 30 days post op per protocol   Device: notified Ronalee Belts from [employer] to turn pts ICD back on 11/09/20 -  [employer] single -  Pacing: VVI 40 - Therapies: on 200 bpm  Plan/Recommendations:  1. Call VAD coordinator for equipment or drive line issues 2. MWF drive line dressing change per VAD Coordinator. Next dressing change due 11/18/20.   Zada Girt RN [place] Coordinator  Office: [phone]  24/7 Pager: [phone]

## 2020-11-17 NOTE — Progress Notes (Signed)
Occupational Therapy Session Note  Patient Details  Name: Marcus Johnson. MRN: [id number] Date of Birth: 10/18/1960  Today's Date: 11/17/2020 OT Individual Time: 1002-1030 OT Individual Time Calculation (min): 28 min    Short Term Goals: Week 1:  OT Short Term Goal 1 (Week 1): Pt will don pants with min A OT Short Term Goal 2 (Week 1): Pt will tolerate standing at the sink for ADL with no c/o light headness OT Short Term Goal 3 (Week 1): Pt will complete toilet transfer with min A  Skilled Therapeutic Interventions/Progress Updates:    Pt greeted semi-reclined in bed and agreeable to OT treatment session. Pt came to sitting EOB with close supervision. Pt noted some dizziness in sitting, but tolerable. Worked on R hand fine motor control with picking up medium to small foam cubes. Focus on functional pinch, grasp, and in-hand manipulation. Pt getting frustrated at times, but working hard to improve. Pt returned to bed at end of session with CGA. Pt brought into supine and OT helped pt scoot up in bed. Pt left semi-reclined in bed awaiting next therapy session.   Therapy Documentation Precautions:  Precautions Precautions: Fall,Sternal,Other (comment) Precaution Comments: LVAD, sacral wound, Left BPPV Required Braces or Orthoses: Other Brace Other Brace: thumb loop and buddy strap R hand Restrictions Weight Bearing Restrictions: Yes (sternal precautions) RUE Weight Bearing: Partial weight bearing Other Position/Activity Restrictions: sternal precautions Pain:   denies pain at this time.  Therapy/Group: Individual Therapy  Valma Cava 11/17/2020, 10:36 AM
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
## 2020-11-17 NOTE — Progress Notes (Signed)
Physical Therapy Session Note  Patient Details  Name: Marcus Johnson. MRN: [id number] Date of Birth: January 07, 1961  Today's Date: 11/17/2020 PT Individual Time: 1100-1210 and 4098-1191 PT Individual Time Calculation (min): 70 min and 10 min  Short Term Goals: Week 1:  PT Short Term Goal 1 (Week 1): Patient will perform bed mobility with min A consistently. PT Short Term Goal 2 (Week 1): Patient will perform basic transfers with CGA using LRAD. PT Short Term Goal 3 (Week 1): Patient will ambulate >50 ft without symptoms using LRAD. PT Short Term Goal 4 (Week 1): Patient will tolerate sitting OOB >1 hour. PT Short Term Goal 5 (Week 1): Patient will initate stair training.  Skilled Therapeutic Interventions/Progress Updates:     Session 1: Patient in bed handed off from [place], [place], upon PT arrival. Patient alert and agreeable to PT session. Patient reported mild R elbow pain during session, RN made aware. PT provided repositioning, rest breaks, and distraction as pain interventions throughout session.   Focused beginning of session on Vestibular evaluation/treatment, as patient was being treated for L posterior canal BPPV on acute.  Visual assessment: lateral deviation of L eye, patient reports intermittent diploplia, but none at this time, mild disconjugate gaze with additional saccades during visual tracking, unable to converge on the L Cervical assessment: -Vertebrobasilar insufficiency test, ROM limited in all directions, but functional, mild dizziness reported with turning head L Dix Hallpike: + L [place] Hallpike, symptoms only, nystagmus not visible and patient with difficulty maintaining eyes open due to symptoms Treatment: L Epley Maneuver performed with significant symptoms in dumped position in R side-lying, leading patient with throw his legs off the bed due to having the feeling of "falling and spinning" unable to sustain head position for standard 60 seconds due to assisting  patient with staying on the bed Patient rested in R side-lying with R head turn and slowly progressed to sitting over 5 min to allow symptoms to resolve. Patient with 1 episode of dizziness when reclining in dialysis chair following Epley.   Therapeutic Activity: Bed Mobility: Patient performed supine to/from sit with min A for vestibular assessment and treatment. Provided verbal cues for hand placement to push up to long sitting and sitting from side-lying. Patient transitioned to LVAD battery packs with total A due to upper extremity coordination deficits. Patient able to teach back sequencing and placement without cues today. Placed additional batteries in portable bag and maintained spare batteries with patient throughout session. Transfers: Patient performed stand pivot bed>w/c and w/c>dialysis chair and sit to/from stand x1 from the w/c with min A using RW. Provided verbal cues for hand placement with hand over hand assist for pushing up from w/c and reaching back to sit, forward weight shift with facilitation and 1-2-3 rocking technique.  Gait Training:  Patient ambulated 103 feet using RW with CGA and +2 for w/c follow and line managment. Ambulated with decreased gait speed, decreased step length and height, and mild forward trunk lean. Provided verbal cues for erect posture, paced breathing, and increased step length with improved stride length after. Patient reported moderate "light headedness" following gait training. Resolved in sitting < 3 min.   Wheelchair Mobility:  Patient was transported in the w/c with total A throughout session for energy conservation and time management.  Patient reclined in dialysis chair with Roho cushion for improved pressure distribution at end of session with breaks locked, and all needs within reach. Patient reported 4/10 sacral pain sitting up in the  dialysis chair and 1/10 sacral pain reclined in dialysis chair, RN made aware and informed of patient's  symptoms with vestibular testing and gait. Patient's bed also alarming with error message, RN made aware.   Session 2: Patient in new air bed with RN and dialysis nurse in the room preparing patient for transport to dialysis in the dialysis chair. Educated Engineer, civil (consulting) on placement of Roho cushion in dialysis chair without sheets or chuck pad for improved pressure distribution with patient in prolonged sitting or reclined position. Also, discussed side-lying in reclined position for pressure relief. Patient still on battery packs from pervious PT session and spare batteries provided to dialysis nurses. Informed them of patient's symptoms of dizziness with changes in position in the chair and light headedness with gait earlier today. Patient performed sit to supine with min A +2 from nursing staff, educated on not having patient pull up to sitting due to plexus injury on R. He performed stand pivot transfer bed>dialysis chair with min A +2 for line management with cues as above. Performed posterior scoot in the chair with min A for safe positioning in the chair and improved sitting tolerance. Patient in dialysis chair handed off to nursing staff at end of session.   Therapy Documentation Precautions:  Precautions Precautions: Fall,Sternal,Other (comment) Precaution Comments: LVAD, sacral wound, Left BPPV Required Braces or Orthoses: Other Brace Other Brace: thumb loop and buddy strap R hand Restrictions Weight Bearing Restrictions: Yes RUE Weight Bearing: Partial weight bearing Other Position/Activity Restrictions: sternal precautions   Therapy/Group: Individual Therapy  Marcus Johnson PT, DPT  11/17/2020, 6:21 AM

## 2020-11-17 NOTE — Progress Notes (Signed)
Renal Navigator following along for progress notes indicating patient's improvements in health and stamina to review with Dr. Beryl Meager at [facility] for him to determine if patient can be safely accepted for outpatient HD with LVAD. Patient notes that we tried for two days this week to have patient be dialyzed in a recliner and it appears he is not yet ready. Navigator spoke with HD unit manager who is looking in to how we can support patient in this.  Navigator will continue to follow closely.   Marcus Johnson, [place] Renal Navigator [phone]

## 2020-11-17 NOTE — Progress Notes (Signed)
Ordered IV team to disconnect heparin. Called pharmacy to vfy that medication to discontinued as was unaware and order was not rolled over for RN to see. She confirmed that medication is to be discontinued per heart failure team according to her.

## 2020-11-17 NOTE — Progress Notes (Signed)
LVAD Coordinator Rounding Note:  HM III LVAD implanted on 10/13/20 by Dr Cyndia Bent under Destination Therapy criteria.  Discharged from [facility] and admitted to [facility] 11/14/20.  Pt lying in bed awake, pleasant, very talkative. Says he is "working hard" and gets tired, but is "doing my best". Encouragement offered. Pt denies any complaints.   Pt has HD dialysis scheduled on Tues/Thurs/Sat.   Vital signs: Temp: 98.6 HR: 85 Doppler: 100 Auto cuff: 106/93 (99) O2 Sat: 100% on RA Wt: 183.6>190.9>194 lbs    LVAD interrogation reveals:  Speed: 5200 Flow: 4.8 Power: 3.7w PI: 2.8 Hematocrit: 29  Alarms: none Events: 50 PI events 11/15/20  Fixed speed: 5200 Low speed limit: 4900  Drive Line:  Existing VAD dressing removed and site care performed using sterile technique. Drive line exit site cleaned with Chlora prep applicators x 2, allowed to dry,and [employer] dressing (all that is available) with silver stripapplied. Exit site healingand unincorporated, the velour is fully implanted at exit site.Small amount of serosanguinous drainage.No redness, tenderness, foul odor or rash noted. Drive line anchor re-applied. Continue Monday, Wednesday, Friday dressing changes by VAD coordinator. Next dressing change will be due 11/18/20. No family available for dressing change.   Labs:  LDH trend: 199>191>196  INR trend: 1.5>1.7>1.9  Anticoagulation Plan: -INR Goal: 2.0-2.5 -ASA Dose: stopped 30 days post op per protocol   Device: notified Ronalee Belts from [employer] to turn pts ICD back on 11/09/20 -  [employer] single -  Pacing: VVI 40 - Therapies: on 200 bpm  Plan/Recommendations:  1. Call VAD coordinator for equipment or drive line issues 2. MWF drive line dressing change per VAD Coordinator. Next dressing change due 11/18/20.   Zada Girt RN [place] Coordinator  Office: [phone]  24/7 Pager: [phone]

## 2020-11-17 NOTE — Progress Notes (Signed)
Marland Kitchen  [facility] Progress Note     Assessment/ Plan:   Acute kidney injury on chronic kidney disease stage III: With underlying chronic kidney disease likely from diabetes/hypertension and worsening renal function from hemodynamically mediated ATN and cardiorenal syndrome. CRRT 3/15-3/22, 3/24-27; HD since 3/28. -Unfortunately, no signs of renal recovery at the moment - s/p [facility] 11/07/20 with IR, appreciate assistance - HD TTS schedule now--> next HD 11/17/20. Able to UF only 368 ml on 4/12.  - Outpt CLIP underway-->renal navigator following for improvement.  - On for HD today on TTS schedule.  - I d/w the patient and his son referring for permanent access placement. I  also  discuss with Dr. Aundra Dubin and Dr. Donzetta Matters whether in the subacute setting with a LVAD (placed 3/10) whether the timing is reasonable. Eventually would like to have a permanent acces in the arm given the risk of infection with a TC.  - Dr. Donzetta Matters did not think a graft would be successful but thought it was reasonable to evaluate for a AVF. I would not advise attempt at an AVF unless chance of success was high (ie very good caliber of vein). Will have to factor in  whether the AVF will mature with LVAD hemodynamics and also whether worth risk of damage to left arm with right arm already compromised.   Acute exacerbation of chronic systolic heart failure with cardiogenic shock:LVAD, placed 3/10. Volume status/UF managed with RRT. Per above UF to euvolemia. midodrine and warfarin, off Revatio d/t orthostasis  Anemia:Likely secondary to chronic disease and exacerbated by recent surgical losses.Transfuse PRBC as needed. Iron deficient, has beenrepleted IV. ESA seems to have fallen off MAR- reinstated for 100 mcg q Tuesday  Renal osteodystrophy:  Last phos 5.3 on 4/12  Atrial fibrillation: per cardiology, on amiodarone  Protein calorie malnutrition, hypoalbuminemia: push protein, per  primary  Pressure ulcer: getting hydrotherapy  DM II: per primary  Vertigo: scopolamine patch, Epley maneuvers with PT  Dispo: hopeful for CIR. Pt will need to be able to sit in a chair for OP dialysis  Subjective:   Tolerated HD Sat with 2 L off but only 368 on 4/12.   Denies dyspnea and working as much as he can with PT, working hard and looked very comfortable sitting on side of bed this am.   Objective:   BP (!) 112/99 (BP Location: Left Arm)   Pulse 83   Temp 98.3 F (36.8 C)   Resp 15   Ht 5' 10.5" (1.791 m)   Wt 88 kg   SpO2 97%   BMI 27.44 kg/m   Intake/Output Summary (Last 24 hours) at 11/17/2020 0901 Last data filed at 11/17/2020 0733 Gross per 24 hour  Intake 1151.2 ml  Output 1 ml  Net 1150.2 ml   Weight change:   Physical Exam: Gen: NAD, sitting on side of  bed  CVS: mechanical hum throughout  Resp: clear  Abd: soft, + drive line Ext: no LE edema,  ACCESS: R TDC c/d/i  Imaging: No results found.  Labs: BMET Recent Labs  Lab 11/11/20 0415 11/12/20 0500 11/13/20 0340 11/14/20 0537 11/15/20 0543 11/16/20 0307 11/17/20 0411  NA 134* 136 136 135 134* 133* 135  K 4.9 5.0 4.7 5.5* 5.3* 4.4 4.6  CL 97* 96* 97* 98 97* 96* 97*  CO2 28 29 28 27 23 27 27   GLUCOSE 110* 135* 112* 117* 130* 111* 115*  BUN 39* 58* 35* 53* 66* 34* 50*  CREATININE  4.78* 6.43* 4.69* 6.63* 7.73* 4.63* 6.32*  CALCIUM 9.1 9.3 9.2 9.5 9.2 8.9 9.0  PHOS 4.7* 5.4* 4.2 6.6* 8.5* 5.3* 6.5*   CBC Recent Labs  Lab 11/14/20 0537 11/15/20 0543 11/16/20 0307 11/17/20 0411  WBC 10.0 9.9 9.5 9.1  HGB 8.8* 8.5* 8.6* 8.2*  HCT 29.2* 28.4* 28.8* 27.6*  MCV 90.4 91.6 90.9 89.3  PLT 248 244 231 203    Medications:    . (feeding supplement) PROSource Plus  30 mL Oral BID BM  . amiodarone  200 mg Oral Daily  . vitamin C  1,000 mg Oral Daily  . Chlorhexidine Gluconate Cloth  6 each Topical Q0600  . darbepoetin (ARANESP) injection - DIALYSIS  100 mcg Intravenous Q Tue-HD   . feeding supplement (NEPRO CARB STEADY)  237 mL Oral BID BM  . gabapentin  100 mg Oral QHS  . insulin aspart  0-15 Units Subcutaneous TID WC  . insulin aspart  0-5 Units Subcutaneous QHS  . insulin glargine  15 Units Subcutaneous Daily  . metoCLOPramide  5 mg Oral TID AC  . midodrine  5 mg Oral TID WC  . multivitamin  1 tablet Oral QHS  . Muscle Rub   Topical BID  . pantoprazole  40 mg Oral Daily  . polyethylene glycol  17 g Oral QHS  . rosuvastatin  20 mg Oral Daily  . senna-docusate  2 tablet Oral BID  . warfarin  3 mg Oral ONCE-1600  . Warfarin - Pharmacist Dosing Inpatient   Does not apply q1600      Otelia Santee, MD 11/17/2020, 9:01 AM

## 2020-11-17 NOTE — Progress Notes (Signed)
Rounded on patient today in correlation to need for HD in the chair to prepare patient for outpatient HD, per the renal navigator. It was noted that patient was not going to attend HD in the chair due to a decreased B/P and sacral wound. Spoke with patient's nurse in regard to the patient's necessity's. Upon rounding on patient the patient is agreeable to treatment in the HD chair. B/P = 116/77, HR = 70, and O2 sats = 95%. Patient's nurse, nurse tech and physical therapist all attended to assist placing patient into chair. Patient's RN traveled with patient to handoff to the hemodialysis nurse secondary to LVAD. Patient currently tolerating initiation well. Will follow for patient tolerance.  Dorthey Sawyer, RN  Dialysis Nurse Coordinator [phone]

## 2020-11-18 DIAGNOSIS — I951 Orthostatic hypotension: Secondary | ICD-10-CM | POA: Diagnosis not present

## 2020-11-18 DIAGNOSIS — I5022 Chronic systolic (congestive) heart failure: Secondary | ICD-10-CM | POA: Diagnosis not present

## 2020-11-18 DIAGNOSIS — Z95811 Presence of heart assist device: Secondary | ICD-10-CM | POA: Diagnosis not present

## 2020-11-18 DIAGNOSIS — R5381 Other malaise: Secondary | ICD-10-CM | POA: Diagnosis not present

## 2020-11-18 LAB — RENAL FUNCTION PANEL
Albumin: 2.6 g/dL — ABNORMAL LOW (ref 3.5–5.0)
Anion gap: 7 (ref 5–15)
BUN: 29 mg/dL — ABNORMAL HIGH (ref 6–20)
CO2: 30 mmol/L (ref 22–32)
Calcium: 9.1 mg/dL (ref 8.9–10.3)
Chloride: 97 mmol/L — ABNORMAL LOW (ref 98–111)
Creatinine, Ser: 4.35 mg/dL — ABNORMAL HIGH (ref 0.61–1.24)
GFR, Estimated: 15 mL/min — ABNORMAL LOW (ref >60)
Glucose, Bld: 91 mg/dL (ref 70–99)
Phosphorus: 4.4 mg/dL (ref 2.5–4.6)
Potassium: 4 mmol/L (ref 3.5–5.1)
Sodium: 134 mmol/L — ABNORMAL LOW (ref 135–145)

## 2020-11-18 LAB — CBC
HCT: 27.9 % — ABNORMAL LOW (ref 39.0–52.0)
Hemoglobin: 8.4 g/dL — ABNORMAL LOW (ref 13.0–17.0)
MCH: 27 pg (ref 26.0–34.0)
MCHC: 30.1 g/dL (ref 30.0–36.0)
MCV: 89.7 fL (ref 80.0–100.0)
Platelets: 202 10*3/uL (ref 150–400)
RBC: 3.11 MIL/uL — ABNORMAL LOW (ref 4.22–5.81)
RDW: 17.2 % — ABNORMAL HIGH (ref 11.5–15.5)
WBC: 8 10*3/uL (ref 4.0–10.5)
nRBC: 0 % (ref 0.0–0.2)

## 2020-11-18 LAB — GLUCOSE, CAPILLARY
Glucose-Capillary: 84 mg/dL (ref 70–99)
Glucose-Capillary: 152 mg/dL — ABNORMAL HIGH (ref 70–99)
Glucose-Capillary: 143 mg/dL — ABNORMAL HIGH (ref 70–99)
Glucose-Capillary: 128 mg/dL — ABNORMAL HIGH (ref 70–99)

## 2020-11-18 LAB — MAGNESIUM: Magnesium: 1.9 mg/dL (ref 1.7–2.4)

## 2020-11-18 LAB — PROTIME-INR
INR: 1.9 — ABNORMAL HIGH (ref 0.8–1.2)
Prothrombin Time: 21.3 seconds — ABNORMAL HIGH (ref 11.4–15.2)

## 2020-11-18 LAB — LACTATE DEHYDROGENASE: LDH: 180 U/L (ref 98–192)

## 2020-11-18 MED ORDER — WARFARIN SODIUM 3 MG PO TABS: 3.0000 mg | ORAL_TABLET | Freq: Once | ORAL | Status: DC

## 2020-11-18 MED ORDER — WARFARIN SODIUM 2 MG PO TABS
4.0000 mg | ORAL_TABLET | Freq: Once | ORAL | Status: AC
  Administered 2020-11-18: 4 mg via ORAL
  Filled 2020-11-18: qty 2

## 2020-11-18 NOTE — Progress Notes (Signed)
Occupational Therapy Session Note  Patient Details  Name: Marcus Johnson. MRN: [id number] Date of Birth: [birth year]  Today's Date: 11/18/2020 OT Individual Time: 5053-9767 OT Individual Time Calculation (min): 60 min    Short Term Goals: Week 1:  OT Short Term Goal 1 (Week 1): Pt will don pants with min A OT Short Term Goal 2 (Week 1): Pt will tolerate standing at the sink for ADL with no c/o light headness OT Short Term Goal 3 (Week 1): Pt will complete toilet transfer with min A  Skilled Therapeutic Interventions/Progress Updates:    1:1. Pt received in bed with NT present finishing up suing urinal. Pt very upset that dialysis transport was late and felt as through staff was unfamiliar with LVAD and made pt nervous. Pt BP assessed 92/82. Pt reporting dizziness improved with teds donned to 3/10. Pt sup>sit with trunk elevation and sits EOB for remainder of session with supervision and no increase in dizziness. Pt requires MAX A for donning pants threading BLE and MIN A for sit to stand from EOB with RW. Pt requires MAX A Overall to don pul over shirt. Pt would benefit from hemi strategy of LUE, head, RUE order. OT reviews process of LVAD change over quizzing pt on item names. Pt able to recall how to do a self check and check battery power charge without cues. Pt requires coaching to verbalize steps while OT pretends she doesn't know anything about LVADs during teach back method. Pt would benefit from continued practice. Exited session with pt seated in bed, exit alarm on and call light in reach   Therapy Documentation Precautions:  Precautions Precautions: Fall,Sternal,Other (comment) Precaution Comments: LVAD, sacral wound, Left BPPV Required Braces or Orthoses: Other Brace Other Brace: thumb loop and buddy strap R hand Restrictions Weight Bearing Restrictions: Yes RUE Weight Bearing: Partial weight bearing Other Position/Activity Restrictions: sternal precautions General:    Vital Signs: Therapy Vitals Temp: 98.6 F (37 C) Temp Source: Oral Pulse Rate: 83 Resp: 20 Patient Position (if appropriate): Lying Oxygen Therapy SpO2: 99 % O2 Device: Room Air Pain:   ADL: ADL Eating: Set up Where Assessed-Eating: Bed level Grooming: Minimal assistance Where Assessed-Grooming: Edge of bed Upper Body Bathing: Unable to assess Lower Body Bathing: Unable to assess Upper Body Dressing: Unable to assess Lower Body Dressing: Maximal assistance Toileting: Unable to assess Toilet Transfer: Unable to assess Tub/Shower Transfer Method: Unable to assess Gaffer Transfer: Unable to assess Vision   Perception    Praxis   Exercises:   Other Treatments:     Therapy/Group: Individual Therapy  Tonny Branch 11/18/2020, 6:45 AM

## 2020-11-18 NOTE — Progress Notes (Signed)
Marland Kitchen  [facility] Progress Note     Assessment/ Plan:   Acute kidney injury on chronic kidney disease stage III: With underlying chronic kidney disease likely from diabetes/hypertension and worsening renal function from hemodynamically mediated ATN and cardiorenal syndrome. CRRT 3/15-3/22, 3/24-27; HD since 3/28. -Unfortunately, no signs of renal recovery currently - s/p [facility] 11/07/20 with IR, appreciate assistance - HD TTS schedule now  - Outpt CLIP underway-->renal navigator following; likely G. Myriam Jacobson  -Previous provider d/w the patient and his son referring for permanent access placement. Also discuss with Dr. Aundra Dubin and Dr. Donzetta Matters whether in the subacute setting with a LVAD (placed 3/10) whether the timing is reasonable. Eventually would like to have a permanent acces in the arm given the risk of infection with a TC. Would prefer graft unless vein caliber is excellent  Acute exacerbation of chronic systolic heart failure with cardiogenic shock:LVAD, placed 3/10. Volume status/UF managed with RRT. Per above UF to euvolemia. midodrine and warfarin, off Revatio d/t orthostasis  Anemia:Likely secondary to chronic disease with CKD contributing.Transfuse PRBC as needed. Iron deficient, has beenrepleted IV.  Continue Aranesp 100 MCG weekly on Tuesday  Renal osteodystrophy: Phosphorus normal at this time  Atrial fibrillation: per cardiology, on amiodarone  Protein calorie malnutrition, hypoalbuminemia: push protein, per primary  Pressure ulcer: getting hydrotherapy  DM II: per primary   Dispo: patient able to do HD in chair; working on setting up Outpatient dialysis  Subjective:   Tolerated dialysis yesterday in a chair.  Nearly 1 L removed with dialysis.  Patient says that dialysis went well but frustrated afterwards regarding transportation.  No other complaints   Objective:   BP 106/81   Pulse 83   Temp 98.6 F (37 C) (Oral)   Resp 20   Ht 5' 10.5"  (1.791 m)   Wt 88 kg   SpO2 99%   BMI 27.44 kg/m   Intake/Output Summary (Last 24 hours) at 11/18/2020 1006 Last data filed at 11/18/2020 0700 Gross per 24 hour  Intake 475 ml  Output 992 ml  Net -517 ml   Weight change: 1.39 kg  Physical Exam: Gen: NAD, sitting on side of  bed  CVS: mechanical hum throughout  Resp: Bilateral chest rise with no increased work of breathing Abd: soft, + drive line Ext: no LE edema,  ACCESS: R TDC c/d/i  Imaging: No results found.  Labs: BMET Recent Labs  Lab 11/12/20 0500 11/13/20 0340 11/14/20 0537 11/15/20 0543 11/16/20 0307 11/17/20 0411 11/18/20 0428  NA 136 136 135 134* 133* 135 134*  K 5.0 4.7 5.5* 5.3* 4.4 4.6 4.0  CL 96* 97* 98 97* 96* 97* 97*  CO2 29 28 27 23 27 27 30   GLUCOSE 135* 112* 117* 130* 111* 115* 91  BUN 58* 35* 53* 66* 34* 50* 29*  CREATININE 6.43* 4.69* 6.63* 7.73* 4.63* 6.32* 4.35*  CALCIUM 9.3 9.2 9.5 9.2 8.9 9.0 9.1  PHOS 5.4* 4.2 6.6* 8.5* 5.3* 6.5* 4.4   CBC Recent Labs  Lab 11/15/20 0543 11/16/20 0307 11/17/20 0411 11/18/20 0428  WBC 9.9 9.5 9.1 8.0  HGB 8.5* 8.6* 8.2* 8.4*  HCT 28.4* 28.8* 27.6* 27.9*  MCV 91.6 90.9 89.3 89.7  PLT 244 231 203 202    Medications:    . (feeding supplement) PROSource Plus  30 mL Oral BID BM  . amiodarone  200 mg Oral Daily  . vitamin C  1,000 mg Oral Daily  . Chlorhexidine Gluconate Cloth  6 each  Topical Q0600  . darbepoetin (ARANESP) injection - DIALYSIS  100 mcg Intravenous Q Tue-HD  . feeding supplement (NEPRO CARB STEADY)  237 mL Oral BID BM  . gabapentin  100 mg Oral QHS  . insulin aspart  0-15 Units Subcutaneous TID WC  . insulin aspart  0-5 Units Subcutaneous QHS  . insulin glargine  15 Units Subcutaneous Daily  . midodrine  5 mg Oral TID WC  . multivitamin  1 tablet Oral QHS  . Muscle Rub   Topical BID  . pantoprazole  40 mg Oral Daily  . polyethylene glycol  17 g Oral QHS  . rosuvastatin  20 mg Oral Daily  . senna-docusate  2 tablet Oral BID   . Warfarin - Pharmacist Dosing Inpatient   Does not apply [place]  11/18/2020, 10:06 AM

## 2020-11-18 NOTE — Progress Notes (Signed)
LVAD Coordinator Rounding Note:  HM III LVAD implanted on 10/13/20 by Dr Cyndia Bent under Destination Therapy criteria.  Discharged from [facility] and admitted to [facility] 11/14/20.  Pt lying in bed awake, pleasant, very talkative. Pt denies complaints this morning. States he thinks therapy "is going well." Reviewed power source change steps with him this morning. His hands are still quite weak and he needs assistance to perform. He is able to verbalize all steps.   No family at bedside for dressing change education.   Pt has HD dialysis scheduled on Tues/Thurs/Sat.   Vital signs: Temp: 98.6 HR: 83 Doppler: 88 Auto cuff: not done O2 Sat: 99% on RA Wt: 183.6>190.9>194 lbs    LVAD interrogation reveals:  Speed: 5200 Flow: 4.8 Power: 3.7w PI: 3.4 Hematocrit: 28  Alarms: none Events: none  Fixed speed: 5200 Low speed limit: 4900  Drive Line:  Existing VAD dressing removed and site care performed using sterile technique. Drive line exit site cleaned with Chlora prep applicators x 2, allowed to dry,and gauze dressing with silver stripapplied. Exit site healingand unincorporated, the velour is fully implanted at exit site.Scant amount of serosanguinous drainage.No redness, tenderness, foul odor or rash noted. Drive line anchor re-applied. Continue Monday, Wednesday, Friday dressing changes by VAD coordinator. Next dressing change will be due 11/21/20. No family available for dressing change.     Labs:  LDH trend: 199>191>196>180  INR trend: 1.5>1.7>1.9  Anticoagulation Plan: -INR Goal: 2.0-2.5 -ASA Dose: stopped 30 days post op per protocol   Device: notified Ronalee Belts from [employer] to turn pts ICD back on 11/09/20 -  [employer] single -  Pacing: VVI 40 - Therapies: on 200 bpm  Plan/Recommendations:  1. Call VAD coordinator for equipment or drive line issues 2. MWF drive line dressing change per VAD Coordinator. Next dressing change due 11/21/20.   Emerson Monte RN [place] Coordinator   Office: [phone]  24/7 Pager: [phone]

## 2020-11-18 NOTE — Progress Notes (Signed)
Occupational Therapy Session Note  Patient Details  Name: Marcus Johnson. MRN: [id number] Date of Birth: 12/13/1960  Today's Date: 11/18/2020 OT Individual Time: 7412-8786 OT Individual Time Calculation (min): 58 min    Short Term Goals: Week 1:  OT Short Term Goal 1 (Week 1): Pt will don pants with min A OT Short Term Goal 2 (Week 1): Pt will tolerate standing at the sink for ADL with no c/o light headness OT Short Term Goal 3 (Week 1): Pt will complete toilet transfer with min A  Skilled Therapeutic Interventions/Progress Updates:    Pt seen for OT treatment starting in bed for further look at vestibular issues.  He presents with left eye exotropic with central gaze.  Decreased ability to converge the left eye to target at midline unless the right eye is occluded.  Decreased ability to track object in superior fields was noted bilaterally, however if he was told to look up at the ceiling AROM was close to [facility] grossly.  With midline fixed gaze as well as with tracking, noted horizontal nystagmus/jerking movement throughout.  However, pt with no report of letters moving around when looking at a word or letter on a piece of paper.  He did report diplopia however when looking superiorly at the "Call don't fall sign above his bed".  No other diplopia was reported when scanning the room with near or far vision.  With VOR testing, he would occasionally come off of the target with head rotation to the right as well as still demonstrating jerking movement/catch up saccade horizontally in both directions.  He also reported a slight increase in his dizziness as well with this.  No difficulty noted with vertical head movements while fixated on target.  Saccadic movement to targets left and right were normal with no increase in dizziness.  He reported this morning that both ears were "muffled" but now it was only slightly muffled on the left side.  VOR cancellation was impaired with horizontal  nystagmus/jerking throughout the movement.  Tested horizontal canal on both sides while was negative.  Completed Marye Round for both the right and left ear with no noted nystagmus after multiple attempts, that was different from the baseline nystagmus/jerking he presents at rest.  He reported slight dizziness with transitions to supine and to sitting but they were not significant.  He would state "here it comes" when transitioning to sitting in reference to the dizziness, however it never really worsened.  Instead his LEs with start spasming and twitching and at times his right arm would as well.  This would resolve after approximately one min or with transition back to supine.  Instructed pt on gaze stabilization exercises to be completed daily with horizontal movements only as this is what provoked the most dizziness.  Instructed him to continue to make a mental note of times when he is most dizzy with positional changes so we can try to dissect it further.  He was left in the bed with the call button and phone in reach.   Therapy Documentation Precautions:  Precautions Precautions: Fall,Sternal,Other (comment) Precaution Comments: LVAD, sacral wound, Left BPPV Required Braces or Orthoses: Other Brace Other Brace: thumb loop and buddy strap R hand Restrictions Weight Bearing Restrictions: No RUE Weight Bearing: Partial weight bearing Other Position/Activity Restrictions: sternal precautions  Pain: Pain Assessment Pain Scale: Faces Pain Score: 0-No pain ADL: See Care Tool Section for some details of mobility  Therapy/Group: Individual Therapy  Cora Stetson OTR/L 11/18/2020, 4:58  PM

## 2020-11-18 NOTE — Progress Notes (Addendum)
Patient ID: Marcus Johnson., male   DOB: [birth year], 60 y.o.   MRN: [id number]     Advanced Heart Failure Rounding Note  PCP-Cardiologist: No primary care provider on file.   Subjective:    - 2/27 Milrinone switched to dobutamine 5 mcg.  - 2/28 Moved to ICU. Dobutamine increased to 7.5 mcg and Norepi 3 mcg added. Diuretics held.  - 3/1 Impella 5.5 placed.  - 3/4 Teeth removed - 3/10 HM3 LVAD placed - 3/13 VAD speed increased to 5500. [name] out  - 3/14 Nephrology consulted. Given 1UPRBCs  - 3/15 Started CRRT. Given 1UPRBCs.  - 3/23 VAD Speed turned down to 5300  - 3/23 CRRT and Milrinone Stopped. Back in Afib - 3/24 CRRT restarted.  - 3/27 CVVH off.  - 3/28 iHD - 3/29 iHD. Walked 75 feet!! - 3/20 Ramp Echo: Fixed speed: 5200 Low speed limit: 4900 - 4/3 S/P RIJ HD catheter.  - 4/10 60th B-day!  - 4/11 Transferred to CIR. Back on heparin drip.   Just finished PT today. Eating lunch. No cardiac complaints.     LVAD Interrogation HM 3: Speed: 5200 Flow: 4.5 PI: 3.4 Power: 3.7.  VAD interrogated personally. Parameters stable.  LDH 180 INR 1.9  Objective:   Weight Range: 88 kg Body mass index is 27.44 kg/m.   Vital Signs:   Temp:  [97.8 F (36.6 C)-98.6 F (37 C)] 98.6 F (37 C) (04/15 0323) Pulse Rate:  [55-100] 83 (04/15 0323) Resp:  [16-20] 20 (04/15 0323) BP: (99-141)/(51-95) 106/81 (04/14 2029) SpO2:  [97 %-99 %] 99 % (04/15 0323) Weight:  [88 kg] 88 kg (04/15 0500) Last BM Date: 11/17/20  Weight change: Filed Weights   11/17/20 0500 11/17/20 1530 11/18/20 0500  Weight: 88 kg 88 kg 88 kg    Intake/Output:   Intake/Output Summary (Last 24 hours) at 11/18/2020 1234 Last data filed at 11/18/2020 0700 Gross per 24 hour  Intake 475 ml  Output 992 ml  Net -517 ml      Physical Exam   General: Well appearing, sitting up in bed eating lunch. No distress HEENT: Normal. Neck: Supple, 8 cm. Carotids OK.  Cardiac:  Mechanical heart sounds with LVAD  hum present.  Lungs:  Clear bilaterally, no wheezing   Abdomen:  NT, ND, no HSM. No bruits or masses. +BS  LVAD exit site: Well-healed and incorporated. Dressing dry and intact. No erythema or drainage. Stabilization device present and accurately applied. Driveline dressing changed daily per sterile technique. Extremities:  Warm and dry. No cyanosis, clubbing, rash, or edema.  Neuro:  Alert & oriented x 3. Cranial nerves grossly intact. Moves all 4 extremities w/o difficulty. Affect pleasant    Skin: sacral decubitus  Labs    CBC Recent Labs    11/17/20 0411 11/18/20 0428  WBC 9.1 8.0  HGB 8.2* 8.4*  HCT 27.6* 27.9*  MCV 89.3 89.7  PLT 203 568   Basic Metabolic Panel Recent Labs    11/17/20 0411 11/18/20 0428  NA 135 134*  K 4.6 4.0  CL 97* 97*  CO2 27 30  GLUCOSE 115* 91  BUN 50* 29*  CREATININE 6.32* 4.35*  CALCIUM 9.0 9.1  MG 2.2 1.9  PHOS 6.5* 4.4   Liver Function Tests Recent Labs    11/17/20 0411 11/18/20 0428  ALBUMIN 2.7* 2.6*   No results for input(s): LIPASE, AMYLASE in the last 72 hours. Cardiac Enzymes No results for input(s): CKTOTAL, CKMB, CKMBINDEX, TROPONINI in the  last 72 hours.  BNP: BNP (last 3 results) Recent Labs    11/02/20 2310 11/10/20 0150 11/17/20 0411  BNP 486.0* 905.1* 630.2*    ProBNP (last 3 results) No results for input(s): PROBNP in the last 8760 hours.   D-Dimer No results for input(s): DDIMER in the last 72 hours. Hemoglobin A1C No results for input(s): HGBA1C in the last 72 hours. Fasting Lipid Panel No results for input(s): CHOL, HDL, LDLCALC, TRIG, CHOLHDL, LDLDIRECT in the last 72 hours. Thyroid Function Tests No results for input(s): TSH, T4TOTAL, T3FREE, THYROIDAB in the last 72 hours.  Invalid input(s): FREET3  Other results:   Imaging    No results found.   Medications:     Scheduled Medications: . (feeding supplement) PROSource Plus  30 mL Oral BID BM  . amiodarone  200 mg Oral Daily  .  vitamin C  1,000 mg Oral Daily  . Chlorhexidine Gluconate Cloth  6 each Topical Q0600  . darbepoetin (ARANESP) injection - DIALYSIS  100 mcg Intravenous Q Tue-HD  . feeding supplement (NEPRO CARB STEADY)  237 mL Oral BID BM  . gabapentin  100 mg Oral QHS  . insulin aspart  0-15 Units Subcutaneous TID WC  . insulin aspart  0-5 Units Subcutaneous QHS  . insulin glargine  15 Units Subcutaneous Daily  . midodrine  5 mg Oral TID WC  . multivitamin  1 tablet Oral QHS  . Muscle Rub   Topical BID  . pantoprazole  40 mg Oral Daily  . polyethylene glycol  17 g Oral QHS  . rosuvastatin  20 mg Oral Daily  . senna-docusate  2 tablet Oral BID  . Warfarin - Pharmacist Dosing Inpatient   Does not apply q1600    Infusions: . sodium chloride 10 mL (11/17/20 1430)    PRN Medications: sodium chloride, acetaminophen, alteplase, alum & mag hydroxide-simeth, bisacodyl, dextrose, diphenhydrAMINE, guaiFENesin-dextromethorphan, heparin, lidocaine-prilocaine, ondansetron (ZOFRAN) IV, oxyCODONE, pentafluoroprop-tetrafluoroeth, prochlorperazine **OR** prochlorperazine **OR** prochlorperazine, sodium chloride flush, sodium phosphate, traZODone, white petrolatum    Assessment/Plan   1.Acute on chronic systolic HF -> cardiogenic shock:ICM.HasMedtronic ICD. EF 15%. Low output persisted despite milrinone and then dobutamine with rise in creatinine to 2.5. Impella 5.5 placed 3/1.  Creatinine improved to 1.7 and HM3 LVAD was placed on 3/10. Developed post-op renal failure requiring CVVH. Now off milrinone and epinephrine. Last echo showed moderate RV enlargement with mildly decreased RV function and VAD speed decreased to 5300 due to left-sided septum. CRRT stopped 3/23 but restarted 3/24. CVVH stopped again on 3/27. Ramp echo 3/30 with speed decreased to 5200. Tolerating iHD for volume management.     - Continue midodrine to 5 tid.   - Off sildenafil with orthostasis.  - Continue compression stockings.  - HD for  volume management 2. LVAD: 3/13 VAD speed increased to 5500. 3/23 speed decreased to 5300 and decreased to 5200 on 3/30. VAD interrogated personally. LDH 180, INR 1.9 today.  - On warfarin, on ASA 81 daily.    - DL site ok 3. CAD s/p CABG2002:Last cath in 6/19 with patent grafts. - Continue Crestor 40 mg daily.  4. AKI on CKD Stage 3b: Suspect that this is a combination of cardiorenal syndrome and diabetic nephropathy and possibly ATN. Developed post-op AKI. Started on CRRT 3/15. CVVH stopped 3/23 but restarted 3/24. Now getting iHD, looks like he will be HD-dependent.  Has tunneled catheter.  - HD T/Th/S - Dr Augustin Coupe to talk with VVS, but with continuous flow LVAD,  think AV fistula would be less likely to mature.  Will await decision by VVS and nephrology in terms of access.  5. Atrial flutter/fibrillation: s/p DC-CV 08/21/18.  - Continue amio 200 mg daily.   6. Wound R Foot: Partial thickness skin loss noted. Cover with mepilex border and change every 5 days. Resolved.  7. Right arm pain: Likely neuropathic from stretch of brachial plexus with Impella placement.  8. ID:  WBCs normal. Afebrile. Blood Cx- NGTD.  9. TG:YBWLSLH of very poor control but hgbA1ctrending down. Most recent 7.7%.  Insulin drip stopped 3/24. Blood glucose controlled on reduced Lantus and  sliding scale insulin to moderate.   - Diabetes coordinators have seen 10. Anemia: Has been stable.  11. Deconditioning - Rehab Team appreciated.  12. Unstageable Pressure Ulcer on buttocks: Improving.  - On hydrotherapy Mon-Sat.  - Continue reposition every 2 hours side lying position. Reposition frequently in the chair. 13. NSVT: No longer on the monitor.  14. Dizziness: This seems more due to positional vertigo than orthostasis.  Has had Epley maneuvers.    Lyda Jester, PA-C  11/18/2020 12:34 PM  Agree with the above PA note.   Stable day, working with PT.  Had HD yesterday.    General: Well appearing this am. NAD.   HEENT: Normal. Neck: Supple, JVP 7-8 cm. Carotids OK.  Cardiac:  Mechanical heart sounds with LVAD hum present.  Lungs:  CTAB, normal effort.  Abdomen:  NT, ND, no HSM. No bruits or masses. +BS  LVAD exit site: Well-healed and incorporated. Dressing dry and intact. No erythema or drainage. Stabilization device present and accurately applied. Driveline dressing changed daily per sterile technique. Extremities:  Warm and dry. No cyanosis, clubbing, rash, or edema.  Neuro:  Alert & oriented x 3. Cranial nerves grossly intact. Moves all 4 extremities w/o difficulty. Affect pleasant   Skin: Decubitus ulcer, healing.   Continue PT (appreciate).    No med changes.   Suspect AV fistula would not mature well with continuous flow LVAD, AV graft is option.  VVS and nephrology considering.    Loralie Champagne 11/18/2020

## 2020-11-18 NOTE — Progress Notes (Signed)
Physical Therapy Session Note  Patient Details  Name: Marcus Johnson. MRN: [id number] Date of Birth: [birth year]  Today's Date: 11/18/2020 PT Individual Time: 0901-1000 PT Individual Time Calculation (min): 59 min   Short Term Goals: Week 1:  PT Short Term Goal 1 (Week 1): Patient will perform bed mobility with min A consistently. PT Short Term Goal 2 (Week 1): Patient will perform basic transfers with CGA using LRAD. PT Short Term Goal 3 (Week 1): Patient will ambulate >50 ft without symptoms using LRAD. PT Short Term Goal 4 (Week 1): Patient will tolerate sitting OOB >1 hour. PT Short Term Goal 5 (Week 1): Patient will initate stair training.  Skilled Therapeutic Interventions/Progress Updates: Pt presents supine in bed and agreeable to therapy.   Pt required min A for rolling and then sidelying to sit at EOB.  Pt has no c/o unsteadiness, although some dizziness noted.  Pt sat EOB and able to explain set-up of LVAD to portable.  Nursing in to review.  Thumb loop and buddy strap applied to R hand.  Pt then transfers sit to stand w/ min A and amb t w/c.  Pt required seated rest break 2/2 dizziness.  Pt amb up to 30' w/ RW and CGA.  Pt performed LE there ex for increased strength w/ occasional rest breaks required.  Pt amb BTB w/ RW and CGA.  LVAD returned to main unit w/ all batteries in charger.  Pt transferred sit to supine w/ min A, all needs in reach.     Therapy Documentation Precautions:  Precautions Precautions: Fall,Sternal,Other (comment) Precaution Comments: LVAD, sacral wound, Left BPPV Required Braces or Orthoses: Other Brace Other Brace: thumb loop and buddy strap R hand Restrictions Weight Bearing Restrictions: Yes RUE Weight Bearing: Partial weight bearing Other Position/Activity Restrictions: sternal precautions General:   Vital Signs:   Pain:c/o buttock pain and R elbow of 7/10.      Therapy/Group: Individual Therapy  Ladoris Gene 11/18/2020,  11:45 AM

## 2020-11-18 NOTE — Progress Notes (Signed)
PROGRESS NOTE   Subjective/Complaints: Got back from HD late, "I was last person in room". Was also upset that nurse was not familiar with LVAD, equipment, etc when she first came in to see him last night when her returned from HD. Still with light-headedness with activity  ROS: Patient denies fever, rash, sore throat, blurred vision, nausea, vomiting, diarrhea, cough, shortness of breath or chest pain, joint or back pain, headache, or mood change.   Objective:   No results found. Recent Labs    11/17/20 0411 11/18/20 0428  WBC 9.1 8.0  HGB 8.2* 8.4*  HCT 27.6* 27.9*  PLT 203 202   Recent Labs    11/17/20 0411 11/18/20 0428  NA 135 134*  K 4.6 4.0  CL 97* 97*  CO2 27 30  GLUCOSE 115* 91  BUN 50* 29*  CREATININE 6.32* 4.35*  CALCIUM 9.0 9.1    Intake/Output Summary (Last 24 hours) at 11/18/2020 0908 Last data filed at 11/18/2020 0700 Gross per 24 hour  Intake 475 ml  Output 992 ml  Net -517 ml     Pressure Injury 10/16/20 Coccyx Left Stage 2 -  Partial thickness loss of dermis presenting as a shallow open injury with a red, pink wound bed without slough. Blister that has drained, 3.5 cm X 3.5 in size (Active)  10/16/20 0800  Location: Coccyx  Location Orientation: Left  Staging: Stage 2 -  Partial thickness loss of dermis presenting as a shallow open injury with a red, pink wound bed without slough.  Wound Description (Comments): Blister that has drained, 3.5 cm X 3.5 in size  Present on Admission: No     Pressure Injury 10/16/20 Coccyx Mid Stage 2 -  Partial thickness loss of dermis presenting as a shallow open injury with a red, pink wound bed without slough. Blister that has drained, 2 cm X 4.5 cm (Active)  10/16/20 0800  Location: Coccyx  Location Orientation: Mid  Staging: Stage 2 -  Partial thickness loss of dermis presenting as a shallow open injury with a red, pink wound bed without slough.  Wound  Description (Comments): Blister that has drained, 2 cm X 4.5 cm  Present on Admission: No     Pressure Injury 10/16/20 Coccyx Right Stage 2 -  Partial thickness loss of dermis presenting as a shallow open injury with a red, pink wound bed without slough. blister that has drained, 0.5 cm X 0.5 cm (Active)  10/16/20 0800  Location: Coccyx  Location Orientation: Right  Staging: Stage 2 -  Partial thickness loss of dermis presenting as a shallow open injury with a red, pink wound bed without slough.  Wound Description (Comments): blister that has drained, 0.5 cm X 0.5 cm  Present on Admission: No    Physical Exam: Vital Signs Blood pressure 106/81, pulse 83, temperature 98.6 F (37 C), temperature source Oral, resp. rate 20, height 5' 10.5" (1.791 m), weight 88 kg, SpO2 99 %.  Constitutional: No distress . Vital signs reviewed. HEENT: EOMI, oral membranes moist Neck: supple Cardiovascular: LVAD hum   Respiratory/Chest: CTA Bilaterally without wheezes or rales. Normal effort    GI/Abdomen: BS +, non-tender, non-distended Ext:  no clubbing, cyanosis, or edema Psych: pleasant and cooperative, a little anxious Skin: tunneled cath right shoulder in place. Right foot intact. Sacral wound 4-5 cm and fairly superficial with predominant granulation tissue--stable. Neuro: Pt is cognitively appropriate with normal insight, memory, and awareness. Cranial nerves 2-12 are intact. Sensory exam is normal. Reflexes are 2+ in all 4's. Fine motor coordination is intact. R>L UE tremors. Motor function is grossly 4/5 with tremor. RUE  Remains 3-4/5 but still limited d/t pain in shoulder. has scattered sensory loss throughout right arm. LE motor 3-4/5  Musculoskeletal: right shoulder doesn't appear all that tender. May be sensitive around right elbow.      Assessment/Plan: 1. Functional deficits which require 3+ hours per day of interdisciplinary therapy in a comprehensive inpatient rehab  setting.  Physiatrist is providing close team supervision and 24 hour management of active medical problems listed below.  Physiatrist and rehab team continue to assess barriers to discharge/monitor patient progress toward functional and medical goals  Care Tool:  Bathing  Bathing activity did not occur:  (time constraints) Body parts bathed by patient: Left arm,Abdomen,Chest,Front perineal area,Right upper leg,Left upper leg,Face   Body parts bathed by helper: Right arm,Buttocks,Left lower leg,Right lower leg     Bathing assist Assist Level: Moderate Assistance - Patient 50 - 74%     Upper Body Dressing/Undressing Upper body dressing Upper body dressing/undressing activity did not occur (including orthotics): Environmental limitations (PICC running) What is the patient wearing?: Hospital gown only    Upper body assist Assist Level: Moderate Assistance - Patient 50 - 74%    Lower Body Dressing/Undressing Lower body dressing      What is the patient wearing?: Incontinence brief     Lower body assist Assist for lower body dressing: Moderate Assistance - Patient 50 - 74%     Toileting Toileting Toileting Activity did not occur Landscape architect and hygiene only): N/A (no void or bm)  Toileting assist Assist for toileting: Dependent - Patient 0%     Transfers Chair/bed transfer  Transfers assist  Chair/bed transfer activity did not occur: Safety/medical concerns  Chair/bed transfer assist level: Minimal Assistance - Patient > 75% Chair/bed transfer assistive device: Programmer, multimedia   Ambulation assist   Ambulation activity did not occur: Safety/medical concerns  Assist level: Contact Guard/Touching assist Assistive device: Walker-rolling Max distance: 103 ft   Walk 10 feet activity   Assist  Walk 10 feet activity did not occur: Safety/medical concerns  Assist level: Contact Guard/Touching assist Assistive device: Walker-rolling    Walk 50 feet activity   Assist Walk 50 feet with 2 turns activity did not occur: Safety/medical concerns  Assist level: Contact Guard/Touching assist Assistive device: Walker-rolling    Walk 150 feet activity   Assist Walk 150 feet activity did not occur: Safety/medical concerns         Walk 10 feet on uneven surface  activity   Assist Walk 10 feet on uneven surfaces activity did not occur: Safety/medical concerns         Wheelchair     Assist Will patient use wheelchair at discharge?: No (Per PT long term goals)   Wheelchair activity did not occur: Safety/medical concerns         Wheelchair 50 feet with 2 turns activity    Assist    Wheelchair 50 feet with 2 turns activity did not occur: Safety/medical concerns       Wheelchair 150 feet activity     Assist  Wheelchair 150 feet activity did not occur: Safety/medical concerns       Blood pressure 106/81, pulse 83, temperature 98.6 F (37 C), temperature source Oral, resp. rate 20, height 5' 10.5" (1.791 m), weight 88 kg, SpO2 99 %.  Medical Problem List and Plan: 1.Functional and mobility deficitssecondary to debility after acute on chronic CHF with subsequent LVAD placement -patient maynotshower -ELOS/Goals: ?2 weeks+   -Continue CIR therapies including PT, OT   -vestibular evaluation today to assess for BPPV 2. Antithrombotics: -DVT/anticoagulation:Pharmaceutical:Coumadin now therapeutic -antiplatelet therapy: ASA 3.Right arm neuropathic pain/brachial plexus injury?/Pain Management:Tylenol or oxycodone prn.  --Increased gabapentin to 200 mgat bedtime with some benefit -sling for RUE support and comfort  -expect good recovery of motor/sensory function in RUE  -pain appears to be less likely RTC given recent exams/sx 4. Mood:LCSW to follow for evaluation and support. -antipsychotic agents: N/A 5.  Neuropsych: This patientiscapable of making decisions on hisown behalf. 6. Skin/Wound Care:Optimize nutritional status--continue Nephro and prosource.  --Sacral decubitus: Air mattress.    -appears much improved-->WTD dressing   -pt is NO LONGER RECEIVING HYDRO --Right foot wound resolved.  7. Fluids/Electrolytes/Nutrition:Strict I/O. Continue to monitor labs with HD? 8. Chronic systolic CHF s/p LVAD 3:  --Cardiology to follow and manage cardiac issues.  -daily weights Filed Weights   11/17/20 0500 11/17/20 1530 11/18/20 0500  Weight: 88 kg 88 kg 88 kg    4/13 midodrine adjusted to 5mg  TID daily  4/15 bp/volume mgt per nephro and cards.    -off revatio   -TEDS, acclimation 9. Acute on chronic anemia: Continue to monitor H/H/ monitor for signs of bleeding.  --Was transfused with multiple units PRBC. --ON Nulecit Tu/Th/Sa  -transfuse as needed in HD 10. T2DM: Hgb A1c- monitor BS ac/hs.BS reasonable on current intake --continue Lantus 15 units/am with SSI for elevated BS.   4/15 reasonable control CBG (last 3)  Recent Labs    11/17/20 2026 11/17/20 2159 11/18/20 0612  GLUCAP 86 110* 84    11. ESRD: Hyperkalemia being managed with lokelma/HD. -On regular diet w/o FR --question due to intake. Phosphorous levels trending up.  --Scheduled HD at the end of the day to help with tolerance of activity during the day. --RD consulted for diet education (multiple restrictions) 12. Mild leucocytosis: Resolving--no current signs of infection.  13. A fib: NSR, HR remains controlled on Amiodarone. 14. Nausea: improved  stopped reglan per pt request    LOS: 4 days A FACE TO [place]  Meredith Staggers 11/18/2020, 9:08 AM

## 2020-11-18 NOTE — Progress Notes (Signed)
Renal Navigator appreciates assistance by HD team to support patient in to recliner for HD treatment yesterday. Patient was agreeable and tolerated treatment well. Navigator faxed notes to MD at [facility] to provide update on this accomplishment-one step closer to outpatient HD. Patient should continue HD in recliner while in rehab as able.   Alphonzo Cruise, [place] Renal Navigator [phone]

## 2020-11-18 NOTE — Progress Notes (Signed)
ANTICOAGULATION CONSULT NOTE - Follow Up Consult  Pharmacy Consult for Warfarin Indication: LVAD  Allergies  Allergen Reactions  . Meclizine Hcl Anaphylaxis and Swelling  . Ivabradine Nausea Only    Patient Measurements: Height: 5' 10.5" (179.1 cm) Weight: 88 kg (194 lb) IBW/kg (Calculated) : 74.15 Heparin Dosing Weight: 89.2 kg  Vital Signs: Temp: 98.7 F (37.1 C) (04/15 1359) Temp Source: Oral (04/15 0323) BP: 107/89 (04/15 1359) Pulse Rate: 83 (04/15 0323)  Labs: Recent Labs    11/16/20 0307 11/16/20 0337 11/17/20 0411 11/17/20 0811 11/18/20 0428  HGB 8.6*  --  8.2*  --  8.4*  HCT 28.8*  --  27.6*  --  27.9*  PLT 231  --  203  --  202  LABPROT 19.9*  --  21.7*  --  21.3*  INR 1.7*  --  1.9*  --  1.9*  HEPARINUNFRC  --  0.11*  --  0.20*  --   CREATININE 4.63*  --  6.32*  --  4.35*    Estimated Creatinine Clearance: 19 mL/min (A) (by C-G formula based on SCr of 4.35 mg/dL (H)).   Medical History: Past Medical History:  Diagnosis Date  . AICD (automatic cardioverter/defibrillator) present    Single-chamber  implantable cardiac defibrillator - Medtronic  . Atrial fibrillation ([place])   . Cataract    Mixed form OU  . CHF (congestive heart failure) ([place])   . Chronic kidney disease   . Chronic kidney disease (CKD), stage III (moderate) (HCC)   . Chronic systolic heart failure ([place])    a. Echo 5/13: Mild LVE, mild LVH, EF 10%, anteroseptal, lateral, apical AK, mild MR, mild LAE, moderately reduced RVSF, mild RAE, PASP 60;  b. 07/2014 Echo: EF 20-2%, diff HK, AKI of antsep/apical/mid-apicalinferior, mod reduced RV.  Marland Kitchen Coronary artery disease    a. s/p CABG 2002 b. LHC 5/13:  dLM 80%, LAD subtotally occluded, pCFX occluded, pRCA 50%, mid? Occlusion with high take off of the PDA with 70% multiple lesions-not bypassed and supplies collaterals to LAD, LIMA-IM/ramus ok, S-OM ok, S-PLA branches ok. Medical therapy was recommended  . COVID   . Diabetic retinopathy (HCC)     NPDR OD, PDR OS  . Dyspnea   . Gout    "on daily RX" (01/08/2018)  . Hypertension   . Hypertensive retinopathy    OU  . Ischemic cardiomyopathy    a. Echo 5/13: Mild LVE, mild LVH, EF 10%, anteroseptal, lateral, apical AK, mild MR, mild LAE, moderately reduced RVSF, mild RAE, PASP 60;  b. 01/2012 s/p MDT [id number] Protecta XT VR AICD;  c. 07/2014 Echo: EF 20-2%, diff HK, AKI of antsep/apical/mid-apicalinferior, mod reduced RV.  Marland Kitchen MRSA (methicillin resistant Staphylococcus aureus)    Status post right foot plantar deep infection with MRSA status post  I&D 10/2008  . Myocardial infarction [facility])    "was told I'd had several before heart OR in 2002" (01/08/2018)  . Noncompliance   . Peripheral neuropathy   . Retinopathy, diabetic, background ([place])   . Syncope   . Type II diabetes mellitus ([place])    requiring insulin   . Vitreous hemorrhage, left (HCC)    and proliferative diabetic retinopathy    Assessment: 60 yo M s/p new LVAD implant with HeartMate III on 3/10. Warfarin started per MD on 3/12, and pharmacy asked to take over warfarin dosing.   INR 1.9 - very sensitive and has been > goal but then dramatic drop - still just below  goal  He is drinking Ensure daily and eating well - per patient report. CBC stable with Hgb 8s, pltc 200s. LDH stable 190  Per most recent VVS note and discussion with Dr. Aundra Dubin, no confirmed plans for vascular access as of yet.  Will continue Coumadin until further notice.  Goal of Therapy: INR 2-2.5 Monitor platelets by anticoagulation protocol: Yes   Plan:  Warfarin 4 mg po x 1 tonight. Daily INR.  [place] Crane, Roylene Reason, BCCP Clinical Pharmacist  11/18/2020 3:16 PM   [facility] pharmacy phone numbers are listed on [email or web address]

## 2020-11-19 DIAGNOSIS — R5381 Other malaise: Secondary | ICD-10-CM | POA: Diagnosis not present

## 2020-11-19 LAB — RENAL FUNCTION PANEL
Albumin: 2.6 g/dL — ABNORMAL LOW (ref 3.5–5.0)
Anion gap: 13 (ref 5–15)
BUN: 48 mg/dL — ABNORMAL HIGH (ref 6–20)
CO2: 27 mmol/L (ref 22–32)
Calcium: 9.4 mg/dL (ref 8.9–10.3)
Chloride: 97 mmol/L — ABNORMAL LOW (ref 98–111)
Creatinine, Ser: 5.88 mg/dL — ABNORMAL HIGH (ref 0.61–1.24)
GFR, Estimated: 10 mL/min — ABNORMAL LOW (ref >60)
Glucose, Bld: 119 mg/dL — ABNORMAL HIGH (ref 70–99)
Phosphorus: 5.6 mg/dL — ABNORMAL HIGH (ref 2.5–4.6)
Potassium: 4.5 mmol/L (ref 3.5–5.1)
Sodium: 137 mmol/L (ref 135–145)

## 2020-11-19 LAB — GLUCOSE, CAPILLARY
Glucose-Capillary: 106 mg/dL — ABNORMAL HIGH (ref 70–99)
Glucose-Capillary: 126 mg/dL — ABNORMAL HIGH (ref 70–99)
Glucose-Capillary: 109 mg/dL — ABNORMAL HIGH (ref 70–99)
Glucose-Capillary: 123 mg/dL — ABNORMAL HIGH (ref 70–99)

## 2020-11-19 LAB — CBC
HCT: 27.9 % — ABNORMAL LOW (ref 39.0–52.0)
Hemoglobin: 8.4 g/dL — ABNORMAL LOW (ref 13.0–17.0)
MCH: 27 pg (ref 26.0–34.0)
MCHC: 30.1 g/dL (ref 30.0–36.0)
MCV: 89.7 fL (ref 80.0–100.0)
Platelets: 202 10*3/uL (ref 150–400)
RBC: 3.11 MIL/uL — ABNORMAL LOW (ref 4.22–5.81)
RDW: 17.2 % — ABNORMAL HIGH (ref 11.5–15.5)
WBC: 9.5 10*3/uL (ref 4.0–10.5)
nRBC: 0 % (ref 0.0–0.2)

## 2020-11-19 LAB — MAGNESIUM: Magnesium: 2 mg/dL (ref 1.7–2.4)

## 2020-11-19 LAB — PROTIME-INR
INR: 1.9 — ABNORMAL HIGH (ref 0.8–1.2)
Prothrombin Time: 22.1 seconds — ABNORMAL HIGH (ref 11.4–15.2)

## 2020-11-19 LAB — LACTATE DEHYDROGENASE: LDH: 169 U/L (ref 98–192)

## 2020-11-19 MED ORDER — WARFARIN SODIUM 2 MG PO TABS
4.0000 mg | ORAL_TABLET | Freq: Once | ORAL | Status: AC
  Administered 2020-11-19: 4 mg via ORAL
  Filled 2020-11-19: qty 2

## 2020-11-19 NOTE — Progress Notes (Signed)
PROGRESS NOTE   Subjective/Complaints: Asks when his HD time is Complains he woke up late and breakfast was cold Has a little bit of pain in buttock and right elbow.  Had BM the day before yesterday  ROS: Patient denies fever, rash, sore throat, blurred vision, nausea, vomiting, diarrhea, cough, shortness of breath or chest pain, joint or back pain, headache, or mood change. +pain in right elbow along small scratch  Objective:   No results found. Recent Labs    11/18/20 0428 11/19/20 0435  WBC 8.0 9.5  HGB 8.4* 8.4*  HCT 27.9* 27.9*  PLT 202 202   Recent Labs    11/18/20 0428 11/19/20 0435  NA 134* 137  K 4.0 4.5  CL 97* 97*  CO2 30 27  GLUCOSE 91 119*  BUN 29* 48*  CREATININE 4.35* 5.88*  CALCIUM 9.1 9.4    Intake/Output Summary (Last 24 hours) at 11/19/2020 1217 Last data filed at 11/19/2020 9147 Gross per 24 hour  Intake 456 ml  Output --  Net 456 ml     Pressure Injury 10/16/20 Coccyx Mid Stage 2 -  Partial thickness loss of dermis presenting as a shallow open injury with a red, pink wound bed without slough. Blister that has drained, 2 cm X 4.5 cm (Active)  10/16/20 0800  Location: Coccyx  Location Orientation: Mid  Staging: Stage 2 -  Partial thickness loss of dermis presenting as a shallow open injury with a red, pink wound bed without slough.  Wound Description (Comments): Blister that has drained, 2 cm X 4.5 cm  Present on Admission: No     Pressure Injury 10/16/20 Coccyx Right Stage 2 -  Partial thickness loss of dermis presenting as a shallow open injury with a red, pink wound bed without slough. blister that has drained, 0.5 cm X 0.5 cm (Active)  10/16/20 0800  Location: Coccyx  Location Orientation: Right  Staging: Stage 2 -  Partial thickness loss of dermis presenting as a shallow open injury with a red, pink wound bed without slough.  Wound Description (Comments): blister that has drained,  0.5 cm X 0.5 cm  Present on Admission: No    Physical Exam: Vital Signs Blood pressure 125/87, pulse 83, temperature 98.1 F (36.7 C), temperature source Oral, resp. rate 18, height 5' 10.5" (1.791 m), weight 88 kg, SpO2 99 %. Gen: no distress, normal appearing HEENT: oral mucosa pink and moist, NCAT Cardio: Reg rate Chest: normal effort, normal rate of breathing Abd: soft, non-distended Ext: no edema Psych: pleasant, normal affect Skin: tunneled cath right shoulder in place. Right foot intact. Sacral wound 4-5 cm and fairly superficial with predominant granulation tissue--stable. Neuro: Pt is cognitively appropriate with normal insight, memory, and awareness. Cranial nerves 2-12 are intact. Sensory exam is normal. Reflexes are 2+ in all 4's. Fine motor coordination is intact. R>L UE tremors. Motor function is grossly 4/5 with tremor. RUE  Remains 3-4/5 but still limited d/t pain in shoulder. has scattered sensory loss throughout right arm. LE motor 3-4/5  Musculoskeletal: right shoulder doesn't appear all that tender. May be sensitive around right elbow.      Assessment/Plan: 1. Functional deficits which  require 3+ hours per day of interdisciplinary therapy in a comprehensive inpatient rehab setting.  Physiatrist is providing close team supervision and 24 hour management of active medical problems listed below.  Physiatrist and rehab team continue to assess barriers to discharge/monitor patient progress toward functional and medical goals  Care Tool:  Bathing  Bathing activity did not occur:  (time constraints) Body parts bathed by patient: Left arm,Abdomen,Chest,Front perineal area,Right upper leg,Left upper leg,Face   Body parts bathed by helper: Right arm,Buttocks,Left lower leg,Right lower leg     Bathing assist Assist Level: Moderate Assistance - Patient 50 - 74%     Upper Body Dressing/Undressing Upper body dressing Upper body dressing/undressing activity did not occur  (including orthotics): Environmental limitations (PICC running) What is the patient wearing?: Hospital gown only    Upper body assist Assist Level: Moderate Assistance - Patient 50 - 74%    Lower Body Dressing/Undressing Lower body dressing      What is the patient wearing?: Incontinence brief     Lower body assist Assist for lower body dressing: Moderate Assistance - Patient 50 - 74%     Toileting Toileting Toileting Activity did not occur Landscape architect and hygiene only): N/A (no void or bm)  Toileting assist Assist for toileting: Dependent - Patient 0%     Transfers Chair/bed transfer  Transfers assist  Chair/bed transfer activity did not occur: Safety/medical concerns  Chair/bed transfer assist level: Minimal Assistance - Patient > 75% Chair/bed transfer assistive device: Programmer, multimedia   Ambulation assist   Ambulation activity did not occur: Safety/medical concerns  Assist level: Contact Guard/Touching assist Assistive device: Walker-rolling Max distance: 30   Walk 10 feet activity   Assist  Walk 10 feet activity did not occur: Safety/medical concerns  Assist level: Contact Guard/Touching assist Assistive device: Walker-rolling   Walk 50 feet activity   Assist Walk 50 feet with 2 turns activity did not occur: Safety/medical concerns  Assist level: Contact Guard/Touching assist Assistive device: Walker-rolling    Walk 150 feet activity   Assist Walk 150 feet activity did not occur: Safety/medical concerns         Walk 10 feet on uneven surface  activity   Assist Walk 10 feet on uneven surfaces activity did not occur: Safety/medical concerns         Wheelchair     Assist Will patient use wheelchair at discharge?: No (Per PT long term goals)   Wheelchair activity did not occur: Safety/medical concerns         Wheelchair 50 feet with 2 turns activity    Assist    Wheelchair 50 feet with 2 turns  activity did not occur: Safety/medical concerns       Wheelchair 150 feet activity     Assist  Wheelchair 150 feet activity did not occur: Safety/medical concerns       Blood pressure 125/87, pulse 83, temperature 98.1 F (36.7 C), temperature source Oral, resp. rate 18, height 5' 10.5" (1.791 m), weight 88 kg, SpO2 99 %.  Medical Problem List and Plan: 1.Functional and mobility deficitssecondary to debility after acute on chronic CHF with subsequent LVAD placement -patient maynotshower -ELOS/Goals: ?2 weeks+   -Continue CIR therapies including PT, OT   -vestibular evaluation to assess for BPPV 2. Antithrombotics: -DVT/anticoagulation:Pharmaceutical:Continue Coumadin, INR 1.9 on 4/16 -antiplatelet therapy: ASA 3.Right arm neuropathic pain/brachial plexus injury?/Pain Management:Tylenol or oxycodone prn.  --Increased gabapentin to 200 mgat bedtime with some benefit -sling for RUE support and  comfort  -expect good recovery of motor/sensory function in RUE  -pain appears to be less likely RTC given recent exams/sx 4. Mood:LCSW to follow for evaluation and support. -antipsychotic agents: N/A 5. Neuropsych: This patientiscapable of making decisions on hisown behalf. 6. Skin/Wound Care:Optimize nutritional status--continue Nephro and prosource.  --Sacral decubitus: Air mattress.    -appears much improved-->WTD dressing   -pt is NO LONGER RECEIVING HYDRO --Right foot wound resolved.  7. Fluids/Electrolytes/Nutrition:Strict I/O. Continue to monitor labs with HD? 8. Chronic systolic CHF s/p LVAD 3:  --Cardiology to follow and manage cardiac issues.  -daily weights Filed Weights   11/17/20 1530 11/18/20 0500 11/19/20 0427  Weight: 88 kg 88 kg 88 kg    4/13 midodrine adjusted to 5mg  TID daily  4/15 bp/volume mgt per nephro and cards.     -off revatio   -TEDS, acclimation 9. Acute on chronic anemia: Continue to monitor H/H/ monitor for signs of bleeding.  --Was transfused with multiple units PRBC. --ON Nulecit Tu/Th/Sa  -transfuse as needed in HD 10. T2DM: Hgb A1c- monitor BS ac/hs.BS reasonable on current intake --continue Lantus 15 units/am with SSI for elevated BS.   4/16 reasonable control CBG (last 3)  Recent Labs    11/19/20 0614 11/19/20 1155 11/19/20 1156  GLUCAP 106* 37* 126*    11. ESRD: Hyperkalemia being managed with lokelma/HD. -On regular diet w/o FR --question due to intake. Phosphorous levels trending up.  --Scheduled HD at the end of the day to help with tolerance of activity during the day. --RD consulted for diet education (multiple restrictions) 12. Mild leucocytosis: Resolving--no current signs of infection.  13. A fib: NSR, HR remains controlled on Amiodarone, continue 14. Nausea: improved  stopped reglan per pt request    LOS: 5 days A FACE TO FACE EVALUATION WAS [place] Gertrude Tarbet 11/19/2020, 12:17 PM

## 2020-11-19 NOTE — Progress Notes (Signed)
CBG level of 37 taken via Capillary finger stick was verified to be falls with second finger stick resulting in 126 mg/dl

## 2020-11-19 NOTE — Progress Notes (Signed)
ANTICOAGULATION CONSULT NOTE - Follow Up Consult  Pharmacy Consult for Warfarin Indication: LVAD  Allergies  Allergen Reactions  . Meclizine Hcl Anaphylaxis and Swelling  . Ivabradine Nausea Only    Patient Measurements: Height: 5' 10.5" (179.1 cm) Weight: 88 kg (193 lb 15.7 oz) IBW/kg (Calculated) : 74.15 kg  Vital Signs: Temp: 98.1 F (36.7 C) (04/16 0312) Temp Source: Oral (04/16 0312) BP: 125/87 (04/16 0312) Pulse Rate: 83 (04/16 0312)  Labs: Recent Labs    11/17/20 0411 11/17/20 0811 11/18/20 0428 11/19/20 0435  HGB 8.2*  --  8.4* 8.4*  HCT 27.6*  --  27.9* 27.9*  PLT 203  --  202 202  LABPROT 21.7*  --  21.3* 22.1*  INR 1.9*  --  1.9* 1.9*  HEPARINUNFRC  --  0.20*  --   --   CREATININE 6.32*  --  4.35* 5.88*    Estimated Creatinine Clearance: 14 mL/min (A) (by C-G formula based on SCr of 5.88 mg/dL (H)).   Medical History: Past Medical History:  Diagnosis Date  . AICD (automatic cardioverter/defibrillator) present    Single-chamber  implantable cardiac defibrillator - Medtronic  . Atrial fibrillation ([place])   . Cataract    Mixed form OU  . CHF (congestive heart failure) ([place])   . Chronic kidney disease   . Chronic kidney disease (CKD), stage III (moderate) (HCC)   . Chronic systolic heart failure ([place])    a. Echo 5/13: Mild LVE, mild LVH, EF 10%, anteroseptal, lateral, apical AK, mild MR, mild LAE, moderately reduced RVSF, mild RAE, PASP 60;  b. 07/2014 Echo: EF 20-2%, diff HK, AKI of antsep/apical/mid-apicalinferior, mod reduced RV.  Marland Kitchen Coronary artery disease    a. s/p CABG 2002 b. LHC 5/13:  dLM 80%, LAD subtotally occluded, pCFX occluded, pRCA 50%, mid? Occlusion with high take off of the PDA with 70% multiple lesions-not bypassed and supplies collaterals to LAD, LIMA-IM/ramus ok, S-OM ok, S-PLA branches ok. Medical therapy was recommended  . COVID   . Diabetic retinopathy (HCC)    NPDR OD, PDR OS  . Dyspnea   . Gout    "on daily RX" (01/08/2018)   . Hypertension   . Hypertensive retinopathy    OU  . Ischemic cardiomyopathy    a. Echo 5/13: Mild LVE, mild LVH, EF 10%, anteroseptal, lateral, apical AK, mild MR, mild LAE, moderately reduced RVSF, mild RAE, PASP 60;  b. 01/2012 s/p MDT [id number] Protecta XT VR AICD;  c. 07/2014 Echo: EF 20-2%, diff HK, AKI of antsep/apical/mid-apicalinferior, mod reduced RV.  Marland Kitchen MRSA (methicillin resistant Staphylococcus aureus)    Status post right foot plantar deep infection with MRSA status post  I&D 10/2008  . Myocardial infarction [facility])    "was told I'd had several before heart OR in 2002" (01/08/2018)  . Noncompliance   . Peripheral neuropathy   . Retinopathy, diabetic, background ([place])   . Syncope   . Type II diabetes mellitus ([place])    requiring insulin   . Vitreous hemorrhage, left (HCC)    and proliferative diabetic retinopathy    Assessment: 60 yo male s/p new LVAD implant with HeartMate III on 3/10. Warfarin started per MD on 3/12, and pharmacy has now been consulted to take over warfarin dosing.   INR today is slightly subtherapeutic at 1.9. The patient has been very sensitive to small warfarin dose changes. The patient's diet has remained stable and no new interacting medications have been started. CBC and LDH are stable.  Per most recent VVS note and previous pharmacist's discussion with Dr. Aundra Dubin, no confirmed plans for vascular access as of yet. Will continue warfarin until further notice.  Goal of Therapy: INR 2-2.5 Monitor platelets by anticoagulation protocol: Yes   Plan:  Give warfarin PO 4 mg tonight x 1 dose Obtain daily PT/INR and CBC Monitor for signs and symptoms of bleeding  Shauna Hugh, PharmD, [place]  PGY-1 Pharmacy Resident 11/19/2020 8:27 AM  Please check [email or web address] for unit-specific pharmacy phone numbers.

## 2020-11-19 NOTE — Progress Notes (Signed)
Patient ID: Marcus Johnson., male   DOB: [birth year], 60 y.o.   MRN: [id number]     Advanced Heart Failure Rounding Note  PCP-Cardiologist: No primary care provider on file.   Subjective:    - 2/27 Milrinone switched to dobutamine 5 mcg.  - 2/28 Moved to ICU. Dobutamine increased to 7.5 mcg and Norepi 3 mcg added. Diuretics held.  - 3/1 Impella 5.5 placed.  - 3/4 Teeth removed - 3/10 HM3 LVAD placed - 3/13 VAD speed increased to 5500. [name] out  - 3/14 Nephrology consulted. Given 1UPRBCs  - 3/15 Started CRRT. Given 1UPRBCs.  - 3/23 VAD Speed turned down to 5300  - 3/23 CRRT and Milrinone Stopped. Back in Afib - 3/24 CRRT restarted.  - 3/27 CVVH off.  - 3/28 iHD - 3/29 iHD. Walked 75 feet!! - 3/20 Ramp Echo: Fixed speed: 5200 Low speed limit: 4900 - 4/3 S/P RIJ HD catheter.  - 4/10 60th B-day!  - 4/11 Transferred to CIR. Back on heparin drip.   Walked 30 feet with PT yesterday. Feeling ok. Now on HD. Denies CP or SOB.   INR 1.9  LVAD Interrogation HM 3: Speed: 5200 Flow: 4.1 PI: 5.1  Power: 3.7.  VAD interrogated personally. Parameters stable.   Objective:   Weight Range: 88 kg Body mass index is 27.44 kg/m.   Vital Signs:   Temp:  [98 F (36.7 C)-98.7 F (37.1 C)] 98.1 F (36.7 C) (04/16 0312) Pulse Rate:  [82-88] 82 (04/16 1345) Resp:  [18-20] 20 (04/16 1345) BP: (106-125)/(73-89) 106/73 (04/16 1345) SpO2:  [99 %] 99 % (04/16 0312) Weight:  [88 kg] 88 kg (04/16 0427) Last BM Date: 11/17/20  Weight change: Filed Weights   11/17/20 1530 11/18/20 0500 11/19/20 0427  Weight: 88 kg 88 kg 88 kg    Intake/Output:   Intake/Output Summary (Last 24 hours) at 11/19/2020 1348 Last data filed at 11/19/2020 0824 Gross per 24 hour  Intake 338 ml  Output --  Net 338 ml      Physical Exam   General:  NAD. In recliner on HD HEENT: normal  Neck: supple. JVP 9-10Carotids 2+ bilat; no bruits. No lymphadenopathy or thryomegaly appreciated. Cor: LVAD hum.  +  HD cath Lungs: Clear. Abdomen: obese soft, nontender, non-distended. No hepatosplenomegaly. No bruits or masses. Good bowel sounds. Driveline site clean. Anchor in place.  Extremities: no cyanosis, clubbing, rash. Warm tr edema  Neuro: alert & oriented x 3. No focal deficits. Moves all 4 without problem   Labs    CBC Recent Labs    11/18/20 0428 11/19/20 0435  WBC 8.0 9.5  HGB 8.4* 8.4*  HCT 27.9* 27.9*  MCV 89.7 89.7  PLT 202 500   Basic Metabolic Panel Recent Labs    11/18/20 0428 11/19/20 0435  NA 134* 137  K 4.0 4.5  CL 97* 97*  CO2 30 27  GLUCOSE 91 119*  BUN 29* 48*  CREATININE 4.35* 5.88*  CALCIUM 9.1 9.4  MG 1.9 2.0  PHOS 4.4 5.6*   Liver Function Tests Recent Labs    11/18/20 0428 11/19/20 0435  ALBUMIN 2.6* 2.6*   No results for input(s): LIPASE, AMYLASE in the last 72 hours. Cardiac Enzymes No results for input(s): CKTOTAL, CKMB, CKMBINDEX, TROPONINI in the last 72 hours.  BNP: BNP (last 3 results) Recent Labs    11/02/20 2310 11/10/20 0150 11/17/20 0411  BNP 486.0* 905.1* 630.2*    ProBNP (last 3 results) No results for  input(s): PROBNP in the last 8760 hours.   D-Dimer No results for input(s): DDIMER in the last 72 hours. Hemoglobin A1C No results for input(s): HGBA1C in the last 72 hours. Fasting Lipid Panel No results for input(s): CHOL, HDL, LDLCALC, TRIG, CHOLHDL, LDLDIRECT in the last 72 hours. Thyroid Function Tests No results for input(s): TSH, T4TOTAL, T3FREE, THYROIDAB in the last 72 hours.  Invalid input(s): FREET3  Other results:   Imaging    No results found.   Medications:     Scheduled Medications: . (feeding supplement) PROSource Plus  30 mL Oral BID BM  . amiodarone  200 mg Oral Daily  . vitamin C  1,000 mg Oral Daily  . Chlorhexidine Gluconate Cloth  6 each Topical Q0600  . darbepoetin (ARANESP) injection - DIALYSIS  100 mcg Intravenous Q Tue-HD  . feeding supplement (NEPRO CARB STEADY)  237 mL Oral  BID BM  . gabapentin  100 mg Oral QHS  . insulin aspart  0-15 Units Subcutaneous TID WC  . insulin aspart  0-5 Units Subcutaneous QHS  . insulin glargine  15 Units Subcutaneous Daily  . midodrine  5 mg Oral TID WC  . multivitamin  1 tablet Oral QHS  . Muscle Rub   Topical BID  . pantoprazole  40 mg Oral Daily  . polyethylene glycol  17 g Oral QHS  . rosuvastatin  20 mg Oral Daily  . senna-docusate  2 tablet Oral BID  . warfarin  4 mg Oral ONCE-1600  . Warfarin - Pharmacist Dosing Inpatient   Does not apply q1600    Infusions: . sodium chloride 10 mL (11/17/20 1430)    PRN Medications: sodium chloride, acetaminophen, alteplase, alum & mag hydroxide-simeth, bisacodyl, dextrose, diphenhydrAMINE, guaiFENesin-dextromethorphan, heparin, lidocaine-prilocaine, ondansetron (ZOFRAN) IV, oxyCODONE, pentafluoroprop-tetrafluoroeth, prochlorperazine **OR** prochlorperazine **OR** prochlorperazine, sodium chloride flush, sodium phosphate, traZODone, white petrolatum    Assessment/Plan   1.Acute on chronic systolic HF -> cardiogenic shock:ICM.HasMedtronic ICD. EF 15%. Low output persisted despite milrinone and then dobutamine with rise in creatinine to 2.5. Impella 5.5 placed 3/1.  Creatinine improved to 1.7 and HM3 LVAD was placed on 3/10. Developed post-op renal failure requiring CVVH. Now off milrinone and epinephrine. Last echo showed moderate RV enlargement with mildly decreased RV function and VAD speed decreased to 5300 due to left-sided septum. CRRT stopped 3/23 but restarted 3/24. CVVH stopped again on 3/27. Ramp echo 3/30 with speed decreased to 5200. Tolerating iHD for volume management.     - Continue midodrine to 5 tid.   - Off sildenafil with orthostasis.  - Continue compression stockings.  - HD for volume management. Tolerating well today. MAPs in 80s,  2. LVAD: 3/13 VAD speed increased to 5500. 3/23 speed decreased to 5300 and decreased to 5200 on 3/30. VAD interrogated  personally. LDH 169, INR 1.9 today - On warfarin, on ASA 81 daily.    - DL site ok 3. CAD s/p CABG2002:Last cath in 6/19 with patent grafts. - Continue Crestor 40 mg daily.  4. AKI on CKD Stage 3b: Suspect that this is a combination of cardiorenal syndrome and diabetic nephropathy and possibly ATN. Developed post-op AKI. Started on CRRT 3/15. CVVH stopped 3/23 but restarted 3/24. Now getting iHD, looks like he will be HD-dependent.  Has tunneled catheter.  - HD T/Th/S - Dr Augustin Coupe to talk with VVS, but with continuous flow LVAD, think AV fistula would be less likely to mature.  Will await decision by VVS and nephrology in terms of access.  5. Atrial flutter/fibrillation: s/p DC-CV 08/21/18.  - Continue amio 200 mg daily.   - Remains in NSR 6. Wound R Foot: Partial thickness skin loss noted. Cover with mepilex border and change every 5 days. Resolved.  7. Right arm pain: Likely neuropathic from stretch of brachial plexus with Impella placement.  8. ID:  WBCs normal. Afebrile. Blood Cx- NGTD.  9. BE:EFEOFHQ of very poor control but hgbA1ctrending down. Most recent 7.7%.  Insulin drip stopped 3/24. Blood glucose controlled on reduced Lantus and  sliding scale insulin to moderate.   - Diabetes coordinators have seen 10. Anemia: Has been stable. hgb 8.4 today 11. Deconditioning - Rehab Team appreciated.  12. Unstageable Pressure Ulcer on buttocks: Improving.  - On hydrotherapy Mon-Sat.  - Continue reposition every 2 hours side lying position. Reposition frequently in the chair. 13. NSVT: No longer on the monitor.  14. Dizziness: This seems more due to positional vertigo than orthostasis.  Has had Epley maneuvers.   Glori Bickers, MD  1:51 PM

## 2020-11-19 NOTE — Progress Notes (Signed)
Routine line care: Triple lumen PICC unremarkable.  Noted pt is no longer receiving IV medications. Last received IV bolus on 4/14. Please consider removal or exchange to single lumen if no IV medications are needed.

## 2020-11-19 NOTE — Progress Notes (Signed)
Marland Kitchen  [facility] Progress Note     Assessment/ Plan:   Acute kidney injury on chronic kidney disease stage III: With underlying chronic kidney disease likely from diabetes/hypertension and worsening renal function from hemodynamically mediated ATN and cardiorenal syndrome. CRRT 3/15-3/22, 3/24-27; HD since 3/28. -Unfortunately, no signs of renal recovery currently - s/p [facility] 11/07/20 with IR, appreciate assistance - HD TTS schedule now  - Outpt CLIP underway-->renal navigator following;  -Previous provider d/w the patient and his son referring for permanent access placement. Also discuss with Dr. Aundra Dubin and Dr. Donzetta Matters whether in the subacute setting with a LVAD (placed 3/10) whether the timing is reasonable. Eventually would like to have a permanent acces in the arm given the risk of infection with a TC. Would prefer graft unless vein caliber is excellent  Acute exacerbation of chronic systolic heart failure with cardiogenic shock:LVAD, placed 3/10. Volume status/UF managed with RRT. Per above UF to euvolemia. midodrine and warfarin, off Revatio d/t orthostasis  Anemia:Likely secondary to chronic disease with CKD contributing.Transfuse PRBC as needed. Iron deficient, has beenrepleted IV.  Continue Aranesp 100 MCG weekly on Tuesday  Renal osteodystrophy: Phosphorus normal at this time  Atrial fibrillation: per cardiology, on amiodarone  Protein calorie malnutrition, hypoalbuminemia: push protein, per primary  DM II: per primary  Dispo: patient able to do HD in chair; working on setting up Outpatient dialysis  Subjective:   Feels well with no complaints   Objective:   BP 125/87 (BP Location: Left Arm)   Pulse 83   Temp 98.1 F (36.7 C) (Oral)   Resp 18   Ht 5' 10.5" (1.791 m)   Wt 88 kg   SpO2 99%   BMI 27.44 kg/m   Intake/Output Summary (Last 24 hours) at 11/19/2020 0846 Last data filed at 11/19/2020 3903 Gross per 24 hour  Intake 693 ml  Output  --  Net 693 ml   Weight change: 0 kg  Physical Exam: Gen: NAD, lying in bed CVS: mechanical hum throughout  Resp: Bilateral chest rise with no increased work of breathing Abd: soft, + drive line Ext: no LE edema,  ACCESS: R TDC c/d/i  Imaging: No results found.  Labs: BMET Recent Labs  Lab 11/13/20 0340 11/14/20 0537 11/15/20 0543 11/16/20 0307 11/17/20 0411 11/18/20 0428 11/19/20 0435  NA 136 135 134* 133* 135 134* 137  K 4.7 5.5* 5.3* 4.4 4.6 4.0 4.5  CL 97* 98 97* 96* 97* 97* 97*  CO2 28 27 23 27 27 30 27   GLUCOSE 112* 117* 130* 111* 115* 91 119*  BUN 35* 53* 66* 34* 50* 29* 48*  CREATININE 4.69* 6.63* 7.73* 4.63* 6.32* 4.35* 5.88*  CALCIUM 9.2 9.5 9.2 8.9 9.0 9.1 9.4  PHOS 4.2 6.6* 8.5* 5.3* 6.5* 4.4 5.6*   CBC Recent Labs  Lab 11/16/20 0307 11/17/20 0411 11/18/20 0428 11/19/20 0435  WBC 9.5 9.1 8.0 9.5  HGB 8.6* 8.2* 8.4* 8.4*  HCT 28.8* 27.6* 27.9* 27.9*  MCV 90.9 89.3 89.7 89.7  PLT 231 203 202 202    Medications:    . (feeding supplement) PROSource Plus  30 mL Oral BID BM  . amiodarone  200 mg Oral Daily  . vitamin C  1,000 mg Oral Daily  . Chlorhexidine Gluconate Cloth  6 each Topical Q0600  . darbepoetin (ARANESP) injection - DIALYSIS  100 mcg Intravenous Q Tue-HD  . feeding supplement (NEPRO CARB STEADY)  237 mL Oral BID BM  . gabapentin  100 mg  Oral QHS  . insulin aspart  0-15 Units Subcutaneous TID WC  . insulin aspart  0-5 Units Subcutaneous QHS  . insulin glargine  15 Units Subcutaneous Daily  . midodrine  5 mg Oral TID WC  . multivitamin  1 tablet Oral QHS  . Muscle Rub   Topical BID  . pantoprazole  40 mg Oral Daily  . polyethylene glycol  17 g Oral QHS  . rosuvastatin  20 mg Oral Daily  . senna-docusate  2 tablet Oral BID  . warfarin  4 mg Oral ONCE-1600  . Warfarin - Pharmacist Dosing Inpatient   Does not apply [place]  11/19/2020, 8:46 AM

## 2020-11-20 ENCOUNTER — Inpatient Hospital Stay (HOSPITAL_COMMUNITY): Payer: Medicare HMO

## 2020-11-20 DIAGNOSIS — R5381 Other malaise: Secondary | ICD-10-CM | POA: Diagnosis not present

## 2020-11-20 LAB — LACTATE DEHYDROGENASE: LDH: 170 U/L (ref 98–192)

## 2020-11-20 LAB — RENAL FUNCTION PANEL
Albumin: 2.6 g/dL — ABNORMAL LOW (ref 3.5–5.0)
Anion gap: 9 (ref 5–15)
BUN: 29 mg/dL — ABNORMAL HIGH (ref 6–20)
CO2: 30 mmol/L (ref 22–32)
Calcium: 9 mg/dL (ref 8.9–10.3)
Chloride: 96 mmol/L — ABNORMAL LOW (ref 98–111)
Creatinine, Ser: 4.08 mg/dL — ABNORMAL HIGH (ref 0.61–1.24)
GFR, Estimated: 16 mL/min — ABNORMAL LOW (ref >60)
Glucose, Bld: 86 mg/dL (ref 70–99)
Phosphorus: 4.2 mg/dL (ref 2.5–4.6)
Potassium: 3.9 mmol/L (ref 3.5–5.1)
Sodium: 135 mmol/L (ref 135–145)

## 2020-11-20 LAB — GLUCOSE, CAPILLARY
Glucose-Capillary: 65 mg/dL — ABNORMAL LOW (ref 70–99)
Glucose-Capillary: 71 mg/dL (ref 70–99)
Glucose-Capillary: 118 mg/dL — ABNORMAL HIGH (ref 70–99)
Glucose-Capillary: 108 mg/dL — ABNORMAL HIGH (ref 70–99)
Glucose-Capillary: 80 mg/dL (ref 70–99)

## 2020-11-20 LAB — PROTIME-INR
INR: 2.1 — ABNORMAL HIGH (ref 0.8–1.2)
Prothrombin Time: 23.5 seconds — ABNORMAL HIGH (ref 11.4–15.2)

## 2020-11-20 LAB — MAGNESIUM: Magnesium: 2 mg/dL (ref 1.7–2.4)

## 2020-11-20 MED ORDER — INSULIN GLARGINE 100 UNIT/ML ~~LOC~~ SOLN
14.0000 [IU] | Freq: Every day | SUBCUTANEOUS | Status: DC
  Administered 2020-11-21 – 2020-11-24 (×4): 14 [IU] via SUBCUTANEOUS
  Filled 2020-11-20 (×5): qty 0.14

## 2020-11-20 MED ORDER — WARFARIN SODIUM 2 MG PO TABS
4.0000 mg | ORAL_TABLET | Freq: Once | ORAL | Status: AC
  Administered 2020-11-20: 4 mg via ORAL
  Filled 2020-11-20: qty 2

## 2020-11-20 MED ORDER — METOCLOPRAMIDE HCL 5 MG PO TABS
5.0000 mg | ORAL_TABLET | Freq: Three times a day (TID) | ORAL | Status: DC
  Administered 2020-11-20 – 2020-11-24 (×10): 5 mg via ORAL
  Filled 2020-11-20 (×10): qty 1

## 2020-11-20 MED ORDER — METOCLOPRAMIDE HCL 5 MG PO TABS
5.0000 mg | ORAL_TABLET | Freq: Three times a day (TID) | ORAL | Status: DC | PRN
  Administered 2020-11-20: 5 mg via ORAL
  Filled 2020-11-20: qty 1

## 2020-11-20 MED ORDER — SORBITOL 70 % SOLN
30.0000 mL | Freq: Every day | Status: DC
  Administered 2020-11-20 – 2020-12-07 (×15): 30 mL via ORAL
  Filled 2020-11-20 (×17): qty 30

## 2020-11-20 MED ORDER — POLYETHYLENE GLYCOL 3350 17 G PO PACK
17.0000 g | PACK | Freq: Two times a day (BID) | ORAL | Status: DC
  Administered 2020-11-20 – 2020-12-07 (×19): 17 g via ORAL
  Filled 2020-11-20 (×34): qty 1

## 2020-11-20 NOTE — Progress Notes (Addendum)
Physical Therapy Session Note  Patient Details  Name: Marcus Johnson. MRN: [id number] Date of Birth: September 27, 1960  Today's Date: 11/20/2020 PT Individual Time: 1300-1400 and 1500-1530 PT Individual Time Calculation (min): 60 min and 30 min   Short Term Goals: Week 1:  PT Short Term Goal 1 (Week 1): Patient will perform bed mobility with min A consistently. PT Short Term Goal 2 (Week 1): Patient will perform basic transfers with CGA using LRAD. PT Short Term Goal 3 (Week 1): Patient will ambulate >50 ft without symptoms using LRAD. PT Short Term Goal 4 (Week 1): Patient will tolerate sitting OOB >1 hour. PT Short Term Goal 5 (Week 1): Patient will initate stair training.  Skilled Therapeutic Interventions/Progress Updates:     Session 1: Patient in w/c with battery packs hooked up and placed in the vest in the room upon PT arrival. Patient alert and agreeable to PT session. Patient and RN report that the patient has several bouts of emesis prior to session. Patient denied nausea at this time. Patient reported 4-6/10 sacral and R elbow pain during session, RN made aware. PT provided repositioning, rest breaks, and distraction as pain interventions throughout session.  Patient eager to initiate stair training and gait training. Patient reports he has 27 steps to enter his apartment. Discussed challenges of performing this at least 3x per week for HD treatments. Patient stated understanding and reports that there is no other place for him to d/c to at this time.   Therapeutic Activity: Bed Mobility: Patient performed sit to supine with supervision and increased time for lifting lower extremities onto the bed. Provided verbal cues for bringing one leg up at a time for reduced abdominal strain and intraabdominal pressure. Transfers: Patient performed sit to/from stand x2 with CGA. Provided verbal cues for scooting forward, forward rocking, and forward weight shift. Patient able to use teach  back method for hand placement on RW and reaching back to sit. On first trial in the main therapy gym, patient reported feeling "woozy" and "dizzy" returned to sitting and symptoms continues to get worse per report. Transported patient back to his room to assess BP via doppler, MAP 80 in sitting. Patient reported improved symptoms in ~4 min in sitting. On second stand patient without symptom onset initially, attempted to obtain standing BP with doppler without success due to patient moving to return to the bed due to symptom onset as above.   Patient sat EOB with symptoms mildly improved. Patient able to teach back sequencing to switch from the battery packs to the monitor and doffed vest with total A. Patient returned to lying as above, had another bout of nausea in lying, sat patient up in the bed in case of emesis. Symptoms improved and patient tolerated lying flat again. Performed scooting up in the bed with min A x2, and rolled on his L side for pressure relief. Placed pillows between knees, behind back and under R arm.  Patient in bed at end of session with breaks locked and all needs within reach. RN made aware of patient's symptoms and MAP reading during session.   Session 2:  Patient in bed with LVAD hooked up to the monitor upon PT arrival. Patient alert and agreeable to PT session. Patient reported 7/10 sacral pain during session, RN made aware. PT provided repositioning, rest breaks, and distraction as pain interventions throughout session. Patient denied symptoms at beginning of session. Focused session on obtaining orthostatic vitals with RN assisting.  Orthostatic Vitals  via doppler: Supine: MAP: 85 (asymptomatic) Sitting: MAP: 82 (asymptomatic) Standing: MAP: 78 (light headed/woozy)  Therapeutic Activity: Bed Mobility: Patient performed supine to/from sit with min A from RN, educated RN on allowing patient to perform mobility as much as he can on his own each time for improved  independence with mobility throughout the day.  Transfers: Patient performed sit to/from stand x2 with min A using RW. Provided verbal cues as above. Patient stood >1 min on the first trial with moderate symptoms and stood to take 3 lateral steps to the R with moderate symptoms on the second trial  Patient in R side-lying with RN preparing to change sacral dressing at end of session with breaks locked and all needs within reach.   Therapy Documentation Precautions:  Precautions Precautions: Fall,Sternal,Other (comment) Precaution Comments: LVAD, sacral wound, Left BPPV Required Braces or Orthoses: Other Brace Other Brace: thumb loop and buddy strap R hand Restrictions Weight Bearing Restrictions: Yes RUE Weight Bearing: Partial weight bearing Other Position/Activity Restrictions: sternal precautions   Therapy/Group: Individual Therapy  Mele Sylvester L Neysa Arts PT, DPT  11/20/2020, 4:07 PM

## 2020-11-20 NOTE — Progress Notes (Signed)
PROGRESS NOTE   Subjective/Complaints: Feeling nauseous this morning. Received Reglan in the past and felt this was more effective for him, ordered He has no other complaints Asks for a chocolate bunny for Easter :)  ROS: Patient denies fever, rash, sore throat, blurred vision, nausea, vomiting, diarrhea, cough, shortness of breath or chest pain, joint or back pain, headache, or mood change. +pain in right elbow along small scratch  Objective:   No results found. Recent Labs    11/18/20 0428 11/19/20 0435  WBC 8.0 9.5  HGB 8.4* 8.4*  HCT 27.9* 27.9*  PLT 202 202   Recent Labs    11/19/20 0435 11/20/20 0418  NA 137 135  K 4.5 3.9  CL 97* 96*  CO2 27 30  GLUCOSE 119* 86  BUN 48* 29*  CREATININE 5.88* 4.08*  CALCIUM 9.4 9.0    Intake/Output Summary (Last 24 hours) at 11/20/2020 1310 Last data filed at 11/20/2020 0800 Gross per 24 hour  Intake 120 ml  Output 1000 ml  Net -880 ml     Pressure Injury 10/16/20 Coccyx Mid Stage 2 -  Partial thickness loss of dermis presenting as a shallow open injury with a red, pink wound bed without slough. Blister that has drained, 2 cm X 4.5 cm (Active)  10/16/20 0800  Location: Coccyx  Location Orientation: Mid  Staging: Stage 2 -  Partial thickness loss of dermis presenting as a shallow open injury with a red, pink wound bed without slough.  Wound Description (Comments): Blister that has drained, 2 cm X 4.5 cm  Present on Admission: No     Pressure Injury 10/16/20 Coccyx Right Stage 2 -  Partial thickness loss of dermis presenting as a shallow open injury with a red, pink wound bed without slough. blister that has drained, 0.5 cm X 0.5 cm (Active)  10/16/20 0800  Location: Coccyx  Location Orientation: Right  Staging: Stage 2 -  Partial thickness loss of dermis presenting as a shallow open injury with a red, pink wound bed without slough.  Wound Description (Comments):  blister that has drained, 0.5 cm X 0.5 cm  Present on Admission: No    Physical Exam: Vital Signs Blood pressure 94/81, pulse 82, temperature 98.8 F (37.1 C), temperature source Oral, resp. rate 18, height 5' 10.5" (1.791 m), weight 88 kg, SpO2 97 %. Gen: no distress, normal appearing HEENT: oral mucosa pink and moist, NCAT Cardio: Reg rate Chest: normal effort, normal rate of breathing Abd: soft, non-distended Ext: no edema Psych: pleasant, normal affect Skin: tunneled cath right shoulder in place. Right foot intact. Sacral wound 4-5 cm and fairly superficial with predominant granulation tissue--stable. Neuro: Pt is cognitively appropriate with normal insight, memory, and awareness. Cranial nerves 2-12 are intact. Sensory exam is normal. Reflexes are 2+ in all 4's. Fine motor coordination is intact. R>L UE tremors. Motor function is grossly 4/5 with tremor. RUE  Remains 3-4/5 but still limited d/t pain in shoulder. has scattered sensory loss throughout right arm. LE motor 3-4/5  Musculoskeletal: right shoulder doesn't appear all that tender. May be sensitive around right elbow.      Assessment/Plan: 1. Functional deficits which require  3+ hours per day of interdisciplinary therapy in a comprehensive inpatient rehab setting.  Physiatrist is providing close team supervision and 24 hour management of active medical problems listed below.  Physiatrist and rehab team continue to assess barriers to discharge/monitor patient progress toward functional and medical goals  Care Tool:  Bathing  Bathing activity did not occur:  (time constraints) Body parts bathed by patient: Left arm,Abdomen,Chest,Front perineal area,Right upper leg,Left upper leg,Face   Body parts bathed by helper: Right arm,Buttocks,Left lower leg,Right lower leg     Bathing assist Assist Level: Moderate Assistance - Patient 50 - 74%     Upper Body Dressing/Undressing Upper body dressing Upper body  dressing/undressing activity did not occur (including orthotics): Environmental limitations (PICC running) What is the patient wearing?: Hospital gown only    Upper body assist Assist Level: Moderate Assistance - Patient 50 - 74%    Lower Body Dressing/Undressing Lower body dressing      What is the patient wearing?: Incontinence brief     Lower body assist Assist for lower body dressing: Moderate Assistance - Patient 50 - 74%     Toileting Toileting Toileting Activity did not occur Landscape architect and hygiene only): N/A (no void or bm)  Toileting assist Assist for toileting: Dependent - Patient 0%     Transfers Chair/bed transfer  Transfers assist  Chair/bed transfer activity did not occur: Safety/medical concerns  Chair/bed transfer assist level: Minimal Assistance - Patient > 75% Chair/bed transfer assistive device: Programmer, multimedia   Ambulation assist   Ambulation activity did not occur: Safety/medical concerns  Assist level: Contact Guard/Touching assist Assistive device: Walker-rolling Max distance: 30   Walk 10 feet activity   Assist  Walk 10 feet activity did not occur: Safety/medical concerns  Assist level: Contact Guard/Touching assist Assistive device: Walker-rolling   Walk 50 feet activity   Assist Walk 50 feet with 2 turns activity did not occur: Safety/medical concerns  Assist level: Contact Guard/Touching assist Assistive device: Walker-rolling    Walk 150 feet activity   Assist Walk 150 feet activity did not occur: Safety/medical concerns         Walk 10 feet on uneven surface  activity   Assist Walk 10 feet on uneven surfaces activity did not occur: Safety/medical concerns         Wheelchair     Assist Will patient use wheelchair at discharge?: No (Per PT long term goals)   Wheelchair activity did not occur: Safety/medical concerns         Wheelchair 50 feet with 2 turns  activity    Assist    Wheelchair 50 feet with 2 turns activity did not occur: Safety/medical concerns       Wheelchair 150 feet activity     Assist  Wheelchair 150 feet activity did not occur: Safety/medical concerns       Blood pressure 94/81, pulse 82, temperature 98.8 F (37.1 C), temperature source Oral, resp. rate 18, height 5' 10.5" (1.791 m), weight 88 kg, SpO2 97 %.  Medical Problem List and Plan: 1.Functional and mobility deficitssecondary to debility after acute on chronic CHF with subsequent LVAD placement -patient maynotshower -ELOS/Goals: ?2 weeks+   -Continue CIR therapies including PT, OT   -vestibular evaluation to assess for BPPV 2. Chronic systolic CHF s/p LVAD 3:  -DVT/anticoagulation:Pharmaceutical:Continue Coumadin, INR 2.1 on 4/17 -antiplatelet therapy: ASA 3.Right arm neuropathic pain/brachial plexus injury?/Pain Management:Tylenol or oxycodone prn.  --Increased gabapentin to 200 mgat bedtime with some benefit -  sling for RUE support and comfort  -expect good recovery of motor/sensory function in RUE  -pain appears to be less likely RTC given recent exams/sx 4. Mood:LCSW to follow for evaluation and support. -antipsychotic agents: N/A 5. Neuropsych: This patientiscapable of making decisions on hisown behalf. 6. Skin/Wound Care:Optimize nutritional status--continue Nephro and prosource.  --Sacral decubitus: Air mattress.    -appears much improved-->WTD dressing   -pt is NO LONGER RECEIVING HYDRO --Right foot wound resolved.  7. Fluids/Electrolytes/Nutrition:Strict I/O. Continue to monitor labs with HD? 8. Chronic systolic CHF s/p LVAD 3:  --Cardiology to follow and manage cardiac issues.  -daily weights Filed Weights   11/19/20 0427 11/19/20 1300 11/20/20 0450  Weight: 88 kg 88 kg 88 kg    4/13  midodrine adjusted to 5mg  TID daily  4/15 bp/volume mgt per nephro and cards.    -off revatio   -TEDS, acclimation 9. Acute on chronic anemia: Continue to monitor H/H/ monitor for signs of bleeding.  --Was transfused with multiple units PRBC. --ON Nulecit Tu/Th/Sa  -transfuse as needed in HD 10. T2DM: Hgb A1c- monitor BS ac/hs.BS reasonable on current intake --continue Lantus 15 units/am with SSI for elevated BS.   4/17 CBG 65-118: will decrease lantus to 14U CBG (last 3)  Recent Labs    11/20/20 0611 11/20/20 0630 11/20/20 1209  GLUCAP 65* 71 118*    11. ESRD: Hyperkalemia being managed with lokelma/HD. -On regular diet w/o FR --question due to intake. Phosphorous levels trending up.  --Scheduled HD at the end of the day to help with tolerance of activity during the day. --RD consulted for diet education (multiple restrictions) 12. Mild leucocytosis: Resolving--no current signs of infection.  13. A fib: NSR, HR remains controlled on Amiodarone, continue 14. Nausea: patient now requested to restart reglan, started 5mg  TID prn.     LOS: 6 days A FACE TO FACE EVALUATION WAS PERFORMED  Armani Gawlik P Andrewjames Weirauch 11/20/2020, 1:10 PM

## 2020-11-20 NOTE — Progress Notes (Signed)
Hypoglycemic Event  CBG:65  Treatment: 8 oz juice/soda  Symptoms: None  Follow-up CBG: Time:0630 CBG Result:71  Possible Reasons for Event: Inadequate meal intake  Comments/MD notified:    Beverley Fiedler

## 2020-11-20 NOTE — Progress Notes (Signed)
Occupational Therapy Session Note  Patient Details  Name: Marcus Johnson. MRN: [id number] Date of Birth: 1961-06-12  Today's Date: 11/20/2020 OT Individual Time: 3202-3343 OT Individual Time Calculation (min): 60 min   Session 2: OT Individual Time: 5686-1683 OT Individual Time Calculation (min): 40 min    Short Term Goals: Week 1:  OT Short Term Goal 1 (Week 1): Pt will don pants with min A OT Short Term Goal 2 (Week 1): Pt will tolerate standing at the sink for ADL with no c/o light headness OT Short Term Goal 3 (Week 1): Pt will complete toilet transfer with min A  Skilled Therapeutic Interventions/Progress Updates:    Pt supine with no c/o pain at rest. Discussed vestibular eval with pt as he had several questions. He transitioned to EOB with supervision. Immediate onset of sacral pain along wound, 6/10. Pt reporting only slight lightheadedness seated. Pt then completed sit > stand with min A. 7/10 dizziness upon standing and increased confusion/motor planning (?) during turn to sit in w/c. CGA for 5 ft of functional mobility to w/c. Once seated MAP was 92. Gaze stabilization encouraged. Pt completed oral care with set up assist. Pt required assist with shaving d/t tremor for safety. 6/10 lightheadedness for remainder of session. Min A to doff shirt. Supervision for UB bathing. Shirt donned with min A and mod cueing for technique. Pt stood at the sink and pulled down pants to complete peri cleansing with min A. He required cueing for initiation. Pt completed ambulatory transfer back to bed with min A, increasing lightheadedness again requiring an abrupt sit. Pt returned to supine and was slightly nauseas but reported it subsided with a minute of rest. Pt was left supine with all needs met.   Session 2:  Pt supine with no c/o pain, reporting he had n/v after morning session. Discussed need to work on both dexterity and fine motor strength to be able to manage his own LVAD lines. Pt  transferred to EOB with supervision using bed rails. Increased time provided for pt to teach back steps of switching LVAD from wall to battery power. He was unable to manage any of the steps himself but able to verbalize. Pt lacks the [facility] in either hand to efficiently manage the lines. Pt completed stand pivot transfer to the w/c with min A using RW. Black bag packed (pt giving instructions) and taken with pt to gym. He completed activity focused on [facility] with min cueing for hand prehension. He was able to manipulate small object with very weak modified pincer grasp on the RUE. He returned to his room and was left sitting up in the w/c with RN present. Pt's LVAD was switched back over to wall power.    Therapy Documentation Precautions:  Precautions Precautions: Fall,Sternal,Other (comment) Precaution Comments: LVAD, sacral wound, Left BPPV Required Braces or Orthoses: Other Brace Other Brace: thumb loop and buddy strap R hand Restrictions Weight Bearing Restrictions: Yes RUE Weight Bearing: Partial weight bearing Other Position/Activity Restrictions: sternal precautions  Therapy/Group: Individual Therapy  Curtis Sites 11/20/2020, 6:42 AM

## 2020-11-20 NOTE — Progress Notes (Signed)
ANTICOAGULATION CONSULT NOTE - Follow Up Consult  Pharmacy Consult for Warfarin Indication: LVAD  Allergies  Allergen Reactions  . Meclizine Hcl Anaphylaxis and Swelling  . Ivabradine Nausea Only    Patient Measurements: Height: 5' 10.5" (179.1 cm) Weight: 88 kg (194 lb 0.1 oz) IBW/kg (Calculated) : 74.15 kg  Vital Signs: Temp: 98.8 F (37.1 C) (04/17 0428) Temp Source: Oral (04/17 0428) BP: 94/81 (04/17 0428) Pulse Rate: 82 (04/17 0428)  Labs: Recent Labs    11/17/20 0811 11/18/20 0428 11/19/20 0435 11/20/20 0418  HGB  --  8.4* 8.4*  --   HCT  --  27.9* 27.9*  --   PLT  --  202 202  --   LABPROT  --  21.3* 22.1* 23.5*  INR  --  1.9* 1.9* 2.1*  HEPARINUNFRC 0.20*  --   --   --   CREATININE  --  4.35* 5.88* 4.08*    Estimated Creatinine Clearance: 20.2 mL/min (A) (by C-G formula based on SCr of 4.08 mg/dL (H)).   Medical History: Past Medical History:  Diagnosis Date  . AICD (automatic cardioverter/defibrillator) present    Single-chamber  implantable cardiac defibrillator - Medtronic  . Atrial fibrillation ([place])   . Cataract    Mixed form OU  . CHF (congestive heart failure) ([place])   . Chronic kidney disease   . Chronic kidney disease (CKD), stage III (moderate) (HCC)   . Chronic systolic heart failure ([place])    a. Echo 5/13: Mild LVE, mild LVH, EF 10%, anteroseptal, lateral, apical AK, mild MR, mild LAE, moderately reduced RVSF, mild RAE, PASP 60;  b. 07/2014 Echo: EF 20-2%, diff HK, AKI of antsep/apical/mid-apicalinferior, mod reduced RV.  Marland Kitchen Coronary artery disease    a. s/p CABG 2002 b. LHC 5/13:  dLM 80%, LAD subtotally occluded, pCFX occluded, pRCA 50%, mid? Occlusion with high take off of the PDA with 70% multiple lesions-not bypassed and supplies collaterals to LAD, LIMA-IM/ramus ok, S-OM ok, S-PLA branches ok. Medical therapy was recommended  . COVID   . Diabetic retinopathy (HCC)    NPDR OD, PDR OS  . Dyspnea   . Gout    "on daily RX" (01/08/2018)   . Hypertension   . Hypertensive retinopathy    OU  . Ischemic cardiomyopathy    a. Echo 5/13: Mild LVE, mild LVH, EF 10%, anteroseptal, lateral, apical AK, mild MR, mild LAE, moderately reduced RVSF, mild RAE, PASP 60;  b. 01/2012 s/p MDT [id number] Protecta XT VR AICD;  c. 07/2014 Echo: EF 20-2%, diff HK, AKI of antsep/apical/mid-apicalinferior, mod reduced RV.  Marland Kitchen MRSA (methicillin resistant Staphylococcus aureus)    Status post right foot plantar deep infection with MRSA status post  I&D 10/2008  . Myocardial infarction [facility])    "was told I'd had several before heart OR in 2002" (01/08/2018)  . Noncompliance   . Peripheral neuropathy   . Retinopathy, diabetic, background ([place])   . Syncope   . Type II diabetes mellitus ([place])    requiring insulin   . Vitreous hemorrhage, left (HCC)    and proliferative diabetic retinopathy    Assessment: 60 yo male s/p new LVAD implant with HeartMate III on 3/10. Warfarin started per MD on 3/12, and pharmacy has now been consulted to take over warfarin dosing.   INR today is therapeutic at 2.1. The patient has been very sensitive to small warfarin dose changes. The patient's diet has remained stable and no new interacting medications have been started. CBC  and LDH are stable.   Per most recent VVS note and previous pharmacist's discussion with Dr. Aundra Dubin, no confirmed plans for permanent vascular access as of yet. [place] placed 11/07/20 with IR. Will continue warfarin until further notice.  Goal of Therapy: INR 2-2.5 Monitor platelets by anticoagulation protocol: Yes   Plan:  Give warfarin PO 4 mg tonight x 1 dose Obtain daily PT/INR and CBC Monitor for signs and symptoms of bleeding  Shauna Hugh, PharmD, [place]  PGY-1 Pharmacy Resident 11/20/2020 8:10 AM  Please check [email or web address] for unit-specific pharmacy phone numbers.

## 2020-11-20 NOTE — Progress Notes (Addendum)
Patient ID: Marcus Johnson., male   DOB: [birth year], 60 y.o.   MRN: [id number]     Advanced Heart Failure Rounding Note  PCP-Cardiologist: No primary care provider on file.   Subjective:    - 2/27 Milrinone switched to dobutamine 5 mcg.  - 2/28 Moved to ICU. Dobutamine increased to 7.5 mcg and Norepi 3 mcg added. Diuretics held.  - 3/1 Impella 5.5 placed.  - 3/4 Teeth removed - 3/10 HM3 LVAD placed - 3/13 VAD speed increased to 5500. [name] out  - 3/14 Nephrology consulted. Given 1UPRBCs  - 3/15 Started CRRT. Given 1UPRBCs.  - 3/23 VAD Speed turned down to 5300  - 3/23 CRRT and Milrinone Stopped. Back in Afib - 3/24 CRRT restarted.  - 3/27 CVVH off.  - 3/28 iHD - 3/29 iHD. Walked 75 feet!! - 3/20 Ramp Echo: Fixed speed: 5200 Low speed limit: 4900 - 4/3 S/P RIJ HD catheter.  - 4/10 60th B-day!  - 4/11 Transferred to CIR. Back on heparin drip.   Had HD yesterday. Tolerated well. Had nausea off/on all am. Treated with Reglan. Feels ok now. Denies CP or SOB. No BM x 3 days   INR 2.1 No bleeding   LVAD Interrogation HM 3: Speed: 5200 Flow: 3.7 PI: 3.4  Power: 4.0.  VAD interrogated personally. Parameters stable.   Objective:   Weight Range: 88 kg Body mass index is 27.44 kg/m.   Vital Signs:   Temp:  [98.2 F (36.8 C)-98.8 F (37.1 C)] 98.4 F (36.9 C) (04/17 1415) Pulse Rate:  [78-90] 90 (04/17 1415) Resp:  [14-18] 16 (04/17 1415) BP: (91-125)/(49-93) 118/86 (04/17 1415) SpO2:  [91 %-100 %] 91 % (04/17 1415) Weight:  [88 kg] 88 kg (04/17 0450) Last BM Date: 11/17/20  Weight change: Filed Weights   11/19/20 0427 11/19/20 1300 11/20/20 0450  Weight: 88 kg 88 kg 88 kg    Intake/Output:   Intake/Output Summary (Last 24 hours) at 11/20/2020 1605 Last data filed at 11/20/2020 1300 Gross per 24 hour  Intake 320 ml  Output 1000 ml  Net -680 ml      Physical Exam   General:  NAD.  HEENT: normal  Neck: supple. JVP not elevated.  Carotids 2+ bilat; no  bruits. No lymphadenopathy or thryomegaly appreciated. Cor: LVAD hum.  Lungs: Clear. Abdomen: obese soft, nontender, non-distended. No hepatosplenomegaly. No bruits or masses. Good bowel sounds. Driveline site clean. Anchor in place.  Extremities: no cyanosis, clubbing, rash. Warm no edema  Neuro: alert & oriented x 3. No focal deficits. Moves all 4 without problem    Labs    CBC Recent Labs    11/18/20 0428 11/19/20 0435  WBC 8.0 9.5  HGB 8.4* 8.4*  HCT 27.9* 27.9*  MCV 89.7 89.7  PLT 202 272   Basic Metabolic Panel Recent Labs    11/19/20 0435 11/20/20 0418  NA 137 135  K 4.5 3.9  CL 97* 96*  CO2 27 30  GLUCOSE 119* 86  BUN 48* 29*  CREATININE 5.88* 4.08*  CALCIUM 9.4 9.0  MG 2.0 2.0  PHOS 5.6* 4.2   Liver Function Tests Recent Labs    11/19/20 0435 11/20/20 0418  ALBUMIN 2.6* 2.6*   No results for input(s): LIPASE, AMYLASE in the last 72 hours. Cardiac Enzymes No results for input(s): CKTOTAL, CKMB, CKMBINDEX, TROPONINI in the last 72 hours.  BNP: BNP (last 3 results) Recent Labs    11/02/20 2310 11/10/20 0150 11/17/20 0411  BNP  486.0* 905.1* 630.2*    ProBNP (last 3 results) No results for input(s): PROBNP in the last 8760 hours.   D-Dimer No results for input(s): DDIMER in the last 72 hours. Hemoglobin A1C No results for input(s): HGBA1C in the last 72 hours. Fasting Lipid Panel No results for input(s): CHOL, HDL, LDLCALC, TRIG, CHOLHDL, LDLDIRECT in the last 72 hours. Thyroid Function Tests No results for input(s): TSH, T4TOTAL, T3FREE, THYROIDAB in the last 72 hours.  Invalid input(s): FREET3  Other results:   Imaging    No results found.   Medications:     Scheduled Medications: . (feeding supplement) PROSource Plus  30 mL Oral BID BM  . amiodarone  200 mg Oral Daily  . vitamin C  1,000 mg Oral Daily  . Chlorhexidine Gluconate Cloth  6 each Topical Q0600  . darbepoetin (ARANESP) injection - DIALYSIS  100 mcg Intravenous  Q Tue-HD  . feeding supplement (NEPRO CARB STEADY)  237 mL Oral BID BM  . gabapentin  100 mg Oral QHS  . insulin aspart  0-15 Units Subcutaneous TID WC  . insulin aspart  0-5 Units Subcutaneous QHS  . [START ON 11/21/2020] insulin glargine  14 Units Subcutaneous Daily  . midodrine  5 mg Oral TID WC  . multivitamin  1 tablet Oral QHS  . Muscle Rub   Topical BID  . pantoprazole  40 mg Oral Daily  . polyethylene glycol  17 g Oral QHS  . rosuvastatin  20 mg Oral Daily  . senna-docusate  2 tablet Oral BID  . warfarin  4 mg Oral ONCE-1600  . Warfarin - Pharmacist Dosing Inpatient   Does not apply q1600    Infusions: . sodium chloride 10 mL (11/17/20 1430)    PRN Medications: sodium chloride, acetaminophen, alteplase, alum & mag hydroxide-simeth, bisacodyl, dextrose, diphenhydrAMINE, guaiFENesin-dextromethorphan, heparin, lidocaine-prilocaine, metoCLOPramide, ondansetron (ZOFRAN) IV, oxyCODONE, pentafluoroprop-tetrafluoroeth, prochlorperazine **OR** prochlorperazine **OR** prochlorperazine, sodium chloride flush, sodium phosphate, traZODone, white petrolatum    Assessment/Plan   1.Acute on chronic systolic HF -> cardiogenic shock:ICM.HasMedtronic ICD. EF 15%. Low output persisted despite milrinone and then dobutamine with rise in creatinine to 2.5. Impella 5.5 placed 3/1.  Creatinine improved to 1.7 and HM3 LVAD was placed on 3/10. Developed post-op renal failure requiring CVVH. Now off milrinone and epinephrine. Last echo showed moderate RV enlargement with mildly decreased RV function and VAD speed decreased to 5300 due to left-sided septum. CRRT stopped 3/23 but restarted 3/24. CVVH stopped again on 3/27. Ramp echo 3/30 with speed decreased to 5200. Tolerating iHD for volume management.     - Continue midodrine to 5 tid.   - Off sildenafil with orthostasis.  - Continue compression stockings.  - HD for volume management. Tolerated well yesterday. MAPs in 80-90s,  2. LVAD: 3/13 VAD  speed increased to 5500. 3/23 speed decreased to 5300 and decreased to 5200 on 3/30. VAD interrogated personally. LDH 170, INR 2.1 today  - On warfarin, on ASA 81 daily.  Discussed warfarin dosing with PharmD personally. - DL site ok 3. CAD s/p CABG2002:Last cath in 6/19 with patent grafts. - Continue Crestor 40 mg daily.  4. AKI on CKD Stage 3b: Suspect that this is a combination of cardiorenal syndrome and diabetic nephropathy and possibly ATN. Developed post-op AKI. Started on CRRT 3/15. CVVH stopped 3/23 but restarted 3/24. Now getting iHD, looks like he will be HD-dependent.  Has tunneled catheter.  - HD T/Th/S - Dr Augustin Coupe to talk with VVS, but with continuous flow  LVAD, think AV fistula would be less likely to mature.  Will await decision by VVS and nephrology in terms of access.  5. Atrial flutter/fibrillation: s/p DC-CV 08/21/18.  - Continue amio 200 mg daily.   - Remains in NSR 6. Wound R Foot: Partial thickness skin loss noted. Cover with mepilex border and change every 5 days. Resolved.  7. Right arm pain: Likely neuropathic from stretch of brachial plexus with Impella placement.  - Continue gabapentin. Sling ordered 8. ID:  WBCs normal. Afebrile. Blood Cx- NGTD.  9. IZ:TIWPYKD of very poor control but hgbA1ctrending down. Most recent 7.7%.  Insulin drip stopped 3/24. Blood glucose controlled on reduced Lantus and  sliding scale insulin to moderate.   - Diabetes coordinators have seen 10. Anemia: Has been stable. hgb 8.4 on 4/16 11. Deconditioning - Continue to work with CIR 12. Unstageable Pressure Ulcer on buttocks: Much improved - Hydrotherapy completed - Continue repositioning 13. NSVT: No longer on the monitor.  14. Dizziness: Improved 15. N/V - Has been refusing laxative regimen because he doesn't wan to have BM in HD again - improved with Reglan - check KUB - increase bowel regimen. Discussed dosing with PharmD personally.   Glori Bickers, MD  4:05 PM

## 2020-11-20 NOTE — Progress Notes (Signed)
Marcus Johnson  [facility] Progress Note     Assessment/ Plan:   Acute kidney injury on chronic kidney disease stage III: With underlying chronic kidney disease likely from diabetes/hypertension and worsening renal function from hemodynamically mediated ATN and cardiorenal syndrome. CRRT 3/15-3/22, 3/24-27; HD since 3/28. -Unfortunately, no signs of renal recovery currently - s/p [facility] 11/07/20 with IR, appreciate assistance - HD TTS schedule now; tolerating well  - Outpt CLIP underway-->renal navigator following;  -Previous provider d/w the patient and his son referring for permanent access placement. Also discuss with Dr. Aundra Dubin and Dr. Donzetta Matters whether in the subacute setting with a LVAD (placed 3/10) whether the timing is reasonable. Eventually would like to have a permanent acces in the arm given the risk of infection with a TC. Would prefer graft unless vein caliber is excellent  Acute exacerbation of chronic systolic heart failure with cardiogenic shock:LVAD, placed 3/10. Volume status/UF managed with RRT. Per above UF to euvolemia. midodrine and warfarin, off Revatio d/t orthostasis  Anemia:Likely secondary to chronic disease with CKD contributing.Transfuse PRBC as needed. Iron deficient, has beenrepleted IV.  Continue Aranesp 100 MCG weekly on Tuesday  Renal osteodystrophy: Phosphorus normal at this time  Atrial fibrillation: per cardiology, on amiodarone  Protein calorie malnutrition, hypoalbuminemia: push protein, per primary  DM II: per primary  Dispo: patient able to do HD in chair; working on setting up Outpatient dialysis  Subjective:   Patient feels well today without any complaints.  Dialysis went well yesterday   Objective:   BP 94/81 (BP Location: Left Arm)   Pulse 82   Temp 98.8 F (37.1 C) (Oral)   Resp 18   Ht 5' 10.5" (1.791 m)   Wt 88 kg   SpO2 97%   BMI 27.44 kg/m   Intake/Output Summary (Last 24 hours) at 11/20/2020 0916 Last data  filed at 11/19/2020 1715 Gross per 24 hour  Intake --  Output 1000 ml  Net -1000 ml   Weight change: 0.01 kg  Physical Exam: Gen: NAD, lying in bed CVS: mechanical hum throughout  Resp: Bilateral chest rise with no increased work of breathing Abd: soft, + drive line Ext: no LE edema,  ACCESS: R TDC c/d/i  Imaging: No results found.  Labs: BMET Recent Labs  Lab 11/14/20 0537 11/15/20 0543 11/16/20 0307 11/17/20 0411 11/18/20 0428 11/19/20 0435 11/20/20 0418  NA 135 134* 133* 135 134* 137 135  K 5.5* 5.3* 4.4 4.6 4.0 4.5 3.9  CL 98 97* 96* 97* 97* 97* 96*  CO2 27 23 27 27 30 27 30   GLUCOSE 117* 130* 111* 115* 91 119* 86  BUN 53* 66* 34* 50* 29* 48* 29*  CREATININE 6.63* 7.73* 4.63* 6.32* 4.35* 5.88* 4.08*  CALCIUM 9.5 9.2 8.9 9.0 9.1 9.4 9.0  PHOS 6.6* 8.5* 5.3* 6.5* 4.4 5.6* 4.2   CBC Recent Labs  Lab 11/16/20 0307 11/17/20 0411 11/18/20 0428 11/19/20 0435  WBC 9.5 9.1 8.0 9.5  HGB 8.6* 8.2* 8.4* 8.4*  HCT 28.8* 27.6* 27.9* 27.9*  MCV 90.9 89.3 89.7 89.7  PLT 231 203 202 202    Medications:    . (feeding supplement) PROSource Plus  30 mL Oral BID BM  . amiodarone  200 mg Oral Daily  . vitamin C  1,000 mg Oral Daily  . Chlorhexidine Gluconate Cloth  6 each Topical Q0600  . darbepoetin (ARANESP) injection - DIALYSIS  100 mcg Intravenous Q Tue-HD  . feeding supplement (NEPRO CARB STEADY)  237 mL  Oral BID BM  . gabapentin  100 mg Oral QHS  . insulin aspart  0-15 Units Subcutaneous TID WC  . insulin aspart  0-5 Units Subcutaneous QHS  . insulin glargine  15 Units Subcutaneous Daily  . midodrine  5 mg Oral TID WC  . multivitamin  1 tablet Oral QHS  . Muscle Rub   Topical BID  . pantoprazole  40 mg Oral Daily  . polyethylene glycol  17 g Oral QHS  . rosuvastatin  20 mg Oral Daily  . senna-docusate  2 tablet Oral BID  . warfarin  4 mg Oral ONCE-1600  . Warfarin - Pharmacist Dosing Inpatient   Does not apply [place]  11/20/2020,  9:16 AM

## 2020-11-21 DIAGNOSIS — I5022 Chronic systolic (congestive) heart failure: Secondary | ICD-10-CM | POA: Diagnosis not present

## 2020-11-21 DIAGNOSIS — I951 Orthostatic hypotension: Secondary | ICD-10-CM | POA: Diagnosis not present

## 2020-11-21 DIAGNOSIS — R5381 Other malaise: Secondary | ICD-10-CM | POA: Diagnosis not present

## 2020-11-21 DIAGNOSIS — Z95811 Presence of heart assist device: Secondary | ICD-10-CM | POA: Diagnosis not present

## 2020-11-21 LAB — CBC
HCT: 28.3 % — ABNORMAL LOW (ref 39.0–52.0)
Hemoglobin: 8.6 g/dL — ABNORMAL LOW (ref 13.0–17.0)
MCH: 27.1 pg (ref 26.0–34.0)
MCHC: 30.4 g/dL (ref 30.0–36.0)
MCV: 89.3 fL (ref 80.0–100.0)
Platelets: 190 10*3/uL (ref 150–400)
RBC: 3.17 MIL/uL — ABNORMAL LOW (ref 4.22–5.81)
RDW: 17.2 % — ABNORMAL HIGH (ref 11.5–15.5)
WBC: 9.3 10*3/uL (ref 4.0–10.5)
nRBC: 0 % (ref 0.0–0.2)

## 2020-11-21 LAB — RENAL FUNCTION PANEL
Albumin: 2.7 g/dL — ABNORMAL LOW (ref 3.5–5.0)
Anion gap: 12 (ref 5–15)
BUN: 43 mg/dL — ABNORMAL HIGH (ref 6–20)
CO2: 28 mmol/L (ref 22–32)
Calcium: 9.3 mg/dL (ref 8.9–10.3)
Chloride: 95 mmol/L — ABNORMAL LOW (ref 98–111)
Creatinine, Ser: 5.52 mg/dL — ABNORMAL HIGH (ref 0.61–1.24)
GFR, Estimated: 11 mL/min — ABNORMAL LOW (ref >60)
Glucose, Bld: 79 mg/dL (ref 70–99)
Phosphorus: 5.7 mg/dL — ABNORMAL HIGH (ref 2.5–4.6)
Potassium: 4 mmol/L (ref 3.5–5.1)
Sodium: 135 mmol/L (ref 135–145)

## 2020-11-21 LAB — GLUCOSE, CAPILLARY
Glucose-Capillary: 37 mg/dL — CL (ref 70–99)
Glucose-Capillary: 60 mg/dL — ABNORMAL LOW (ref 70–99)
Glucose-Capillary: 79 mg/dL (ref 70–99)
Glucose-Capillary: 122 mg/dL — ABNORMAL HIGH (ref 70–99)
Glucose-Capillary: 158 mg/dL — ABNORMAL HIGH (ref 70–99)
Glucose-Capillary: 146 mg/dL — ABNORMAL HIGH (ref 70–99)

## 2020-11-21 LAB — LACTATE DEHYDROGENASE: LDH: 174 U/L (ref 98–192)

## 2020-11-21 LAB — MAGNESIUM: Magnesium: 2.2 mg/dL (ref 1.7–2.4)

## 2020-11-21 LAB — PROTIME-INR
INR: 2.4 — ABNORMAL HIGH (ref 0.8–1.2)
Prothrombin Time: 25.9 seconds — ABNORMAL HIGH (ref 11.4–15.2)

## 2020-11-21 MED ORDER — WARFARIN SODIUM 3 MG PO TABS
3.0000 mg | ORAL_TABLET | Freq: Once | ORAL | Status: AC
  Administered 2020-11-21: 3 mg via ORAL
  Filled 2020-11-21: qty 1

## 2020-11-21 MED ORDER — SCOPOLAMINE 1 MG/3DAYS TD PT72
1.0000 | MEDICATED_PATCH | TRANSDERMAL | Status: DC
  Administered 2020-11-21 – 2020-12-09 (×7): 1.5 mg via TRANSDERMAL
  Filled 2020-11-21 (×7): qty 1

## 2020-11-21 MED ORDER — CHLORHEXIDINE GLUCONATE CLOTH 2 % EX PADS
6.0000 | MEDICATED_PAD | Freq: Every day | CUTANEOUS | Status: DC
  Administered 2020-11-22 – 2020-12-01 (×2): 6 via TOPICAL

## 2020-11-21 NOTE — Progress Notes (Signed)
Marcus Johnson  [facility] Progress Note     Assessment/ Plan:   Acute kidney injury on chronic kidney disease stage III: With underlying chronic kidney disease likely from diabetes/hypertension and worsening renal function from hemodynamically mediated ATN and cardiorenal syndrome. CRRT 3/15-3/22, 3/24-27; HD since 3/28. -Unfortunately, no signs of renal recovery currently - s/p [facility] 11/07/20 with IR, appreciate assistance - HD TTS schedule now; tolerating well  - Outpt CLIP underway--> renal navigator following;  -Previous provider d/w the patient and his son referring for permanent access placement. Also discuss with Dr. Aundra Dubin and Dr. Donzetta Matters whether in the subacute setting with a LVAD (placed 3/10) whether the timing is reasonable. Eventually would like to have a permanent acces in the arm given the risk of infection with a TC. Would prefer graft unless vein caliber is excellent   Acute exacerbation of chronic systolic heart failure with cardiogenic shock: LVAD, placed 3/10.  Volume status/UF managed with RRT.   Per above UF to euvolemia.  midodrine and warfarin, off Revatio d/t orthostasis   Anemia: Likely secondary to chronic disease with CKD contributing.  Transfuse PRBC as needed.  Iron deficient, has been repleted IV.  Continue Aranesp 100 MCG weekly on Tuesday  Renal osteodystrophy: Phosphorus elevated 5.7   Atrial fibrillation: per cardiology, on amiodarone   Protein calorie malnutrition, hypoalbuminemia: push protein, per primary   DM II: per primary  Dispo: patient able to do HD in chair; working on setting up Outpatient dialysis  Subjective:    He is smiling and making jokes while working with PT this morning.    Objective:   BP 105/75 (BP Location: Left Arm)   Pulse 84   Temp 97.8 F (36.6 C)   Resp 20   Ht 5' 10.5" (1.791 m)   Wt 84.1 kg   SpO2 97%   BMI 26.23 kg/m   Intake/Output Summary (Last 24 hours) at 11/21/2020 0949 Last data filed at 11/21/2020  0734 Gross per 24 hour  Intake 640 ml  Output --  Net 640 ml   Weight change: -3.903 kg  Physical Exam: GEN: male sitting at the edge of his bed in no distress  CV: LVAD hum, RUE tunneled dialysis catheter  RESP: no increased work of breathing ABD: Bowel sounds present. Soft, Nontender MSK: no LE edema, or calf tenderness SKIN: warm, dry   Imaging: DG Abd Portable 1V  Result Date: 11/20/2020 CLINICAL DATA:  Nausea and vomiting EXAM: PORTABLE ABDOMEN - 1 VIEW COMPARISON:  10/17/2020 FINDINGS: Changes consistent with LVAD are again seen and stable. Feeding catheter has been removed in the interval. Defibrillator is noted. Scattered large and small bowel gas is noted. Retained fecal material is seen consistent with a degree of mild constipation. No obstructive changes are noted. IMPRESSION: Changes of mild constipation.  No obstruction is seen. Electronically Signed   By: Inez Catalina M.D.   On: 11/20/2020 17:20    Labs: BMET Recent Labs  Lab 11/15/20 0543 11/16/20 0307 11/17/20 0411 11/18/20 0428 11/19/20 0435 11/20/20 0418 11/21/20 0508  NA 134* 133* 135 134* 137 135 135  K 5.3* 4.4 4.6 4.0 4.5 3.9 4.0  CL 97* 96* 97* 97* 97* 96* 95*  CO2 23 27 27 30 27 30 28   GLUCOSE 130* 111* 115* 91 119* 86 79  BUN 66* 34* 50* 29* 48* 29* 43*  CREATININE 7.73* 4.63* 6.32* 4.35* 5.88* 4.08* 5.52*  CALCIUM 9.2 8.9 9.0 9.1 9.4 9.0 9.3  PHOS 8.5* 5.3* 6.5*  4.4 5.6* 4.2 5.7*   CBC Recent Labs  Lab 11/17/20 0411 11/18/20 0428 11/19/20 0435 11/21/20 0508  WBC 9.1 8.0 9.5 9.3  HGB 8.2* 8.4* 8.4* 8.6*  HCT 27.6* 27.9* 27.9* 28.3*  MCV 89.3 89.7 89.7 89.3  PLT 203 202 202 190    Medications:    . (feeding supplement) PROSource Plus  30 mL Oral BID BM  . amiodarone  200 mg Oral Daily  . vitamin C  1,000 mg Oral Daily  . Chlorhexidine Gluconate Cloth  6 each Topical Q0600  . darbepoetin (ARANESP) injection - DIALYSIS  100 mcg Intravenous Q Tue-HD  . feeding supplement (NEPRO  CARB STEADY)  237 mL Oral BID BM  . gabapentin  100 mg Oral QHS  . insulin aspart  0-15 Units Subcutaneous TID WC  . insulin aspart  0-5 Units Subcutaneous QHS  . insulin glargine  14 Units Subcutaneous Daily  . metoCLOPramide  5 mg Oral Q8H  . midodrine  5 mg Oral TID WC  . multivitamin  1 tablet Oral QHS  . Muscle Rub   Topical BID  . pantoprazole  40 mg Oral Daily  . polyethylene glycol  17 g Oral BID  . rosuvastatin  20 mg Oral Daily  . scopolamine  1 patch Transdermal Q72H  . senna-docusate  2 tablet Oral BID  . sorbitol  30 mL Oral Daily  . Warfarin - Pharmacist Dosing Inpatient   Does not apply [place], DO  11/21/2020, 9:49 AM

## 2020-11-21 NOTE — Progress Notes (Signed)
Patient ID: Marcus Johnson., male   DOB: 1961/06/22, 60 y.o.   MRN: [id number]     Advanced Heart Failure Rounding Note  PCP-Cardiologist: No primary care provider on file.   Subjective:    - 2/27 Milrinone switched to dobutamine 5 mcg.  - 2/28 Moved to ICU. Dobutamine increased to 7.5 mcg and Norepi 3 mcg added. Diuretics held.  - 3/1 Impella 5.5 placed.  - 3/4 Teeth removed - 3/10 HM3 LVAD placed - 3/13 VAD speed increased to 5500. [name] out  - 3/14 Nephrology consulted. Given 1UPRBCs  - 3/15 Started CRRT. Given 1UPRBCs.  - 3/23 VAD Speed turned down to 5300  - 3/23 CRRT and Milrinone Stopped. Back in Afib - 3/24 CRRT restarted.  - 3/27 CVVH off.  - 3/28 iHD - 3/29 iHD. Walked 75 feet!! - 3/20 Ramp Echo: Fixed speed: 5200 Low speed limit: 4900 - 4/3 S/P RIJ HD catheter.  - 4/10 60th B-day!  - 4/11 Transferred to CIR. Back on heparin drip.   No nausea this morning, but did not sleep last night, was up all night with multiple bowel movements due to aggressive bowel regimen.  MAP 83-94.   INR 2.4 No bleeding   LVAD Interrogation HM 3: Speed: 5200 Flow: 4.5 PI: 3.5  Power: 3.7.  VAD interrogated personally. Parameters stable.   Objective:   Weight Range: 84.1 kg Body mass index is 26.23 kg/m.   Vital Signs:   Temp:  [97.6 F (36.4 C)-98.4 F (36.9 C)] 97.8 F (36.6 C) (04/18 0435) Pulse Rate:  [78-90] 84 (04/18 0522) Resp:  [16-20] 20 (04/18 0435) BP: (105-118)/(70-86) 105/75 (04/18 0522) SpO2:  [91 %-98 %] 97 % (04/18 0435) Weight:  [84.1 kg] 84.1 kg (04/18 0522) Last BM Date: 11/21/20  Weight change: Filed Weights   11/19/20 1300 11/20/20 0450 11/21/20 0522  Weight: 88 kg 88 kg 84.1 kg    Intake/Output:   Intake/Output Summary (Last 24 hours) at 11/21/2020 1024 Last data filed at 11/21/2020 0734 Gross per 24 hour  Intake 640 ml  Output --  Net 640 ml      Physical Exam   General: Well appearing this am. NAD.  HEENT: Normal. Neck: Supple,  JVP 7-8 cm. Carotids OK.  Cardiac:  Mechanical heart sounds with LVAD hum present.  Lungs:  CTAB, normal effort.  Abdomen:  NT, ND, no HSM. No bruits or masses. +BS  LVAD exit site: Well-healed and incorporated. Dressing dry and intact. No erythema or drainage. Stabilization device present and accurately applied. Driveline dressing changed daily per sterile technique. Extremities:  Warm and dry. No cyanosis, clubbing, rash, or edema.  Neuro:  Alert & oriented x 3. Cranial nerves grossly intact. Moves all 4 extremities w/o difficulty. Affect pleasant     Labs    CBC Recent Labs    11/19/20 0435 11/21/20 0508  WBC 9.5 9.3  HGB 8.4* 8.6*  HCT 27.9* 28.3*  MCV 89.7 89.3  PLT 202 644   Basic Metabolic Panel Recent Labs    11/20/20 0418 11/21/20 0508  NA 135 135  K 3.9 4.0  CL 96* 95*  CO2 30 28  GLUCOSE 86 79  BUN 29* 43*  CREATININE 4.08* 5.52*  CALCIUM 9.0 9.3  MG 2.0 2.2  PHOS 4.2 5.7*   Liver Function Tests Recent Labs    11/20/20 0418 11/21/20 0508  ALBUMIN 2.6* 2.7*   No results for input(s): LIPASE, AMYLASE in the last 72 hours. Cardiac Enzymes No  results for input(s): CKTOTAL, CKMB, CKMBINDEX, TROPONINI in the last 72 hours.  BNP: BNP (last 3 results) Recent Labs    11/02/20 2310 11/10/20 0150 11/17/20 0411  BNP 486.0* 905.1* 630.2*    ProBNP (last 3 results) No results for input(s): PROBNP in the last 8760 hours.   D-Dimer No results for input(s): DDIMER in the last 72 hours. Hemoglobin A1C No results for input(s): HGBA1C in the last 72 hours. Fasting Lipid Panel No results for input(s): CHOL, HDL, LDLCALC, TRIG, CHOLHDL, LDLDIRECT in the last 72 hours. Thyroid Function Tests No results for input(s): TSH, T4TOTAL, T3FREE, THYROIDAB in the last 72 hours.  Invalid input(s): FREET3  Other results:   Imaging    DG Abd Portable 1V  Result Date: 11/20/2020 CLINICAL DATA:  Nausea and vomiting EXAM: PORTABLE ABDOMEN - 1 VIEW COMPARISON:   10/17/2020 FINDINGS: Changes consistent with LVAD are again seen and stable. Feeding catheter has been removed in the interval. Defibrillator is noted. Scattered large and small bowel gas is noted. Retained fecal material is seen consistent with a degree of mild constipation. No obstructive changes are noted. IMPRESSION: Changes of mild constipation.  No obstruction is seen. Electronically Signed   By: Inez Catalina M.D.   On: 11/20/2020 17:20     Medications:     Scheduled Medications: . (feeding supplement) PROSource Plus  30 mL Oral BID BM  . amiodarone  200 mg Oral Daily  . vitamin C  1,000 mg Oral Daily  . Chlorhexidine Gluconate Cloth  6 each Topical Q0600  . darbepoetin (ARANESP) injection - DIALYSIS  100 mcg Intravenous Q Tue-HD  . feeding supplement (NEPRO CARB STEADY)  237 mL Oral BID BM  . gabapentin  100 mg Oral QHS  . insulin aspart  0-15 Units Subcutaneous TID WC  . insulin aspart  0-5 Units Subcutaneous QHS  . insulin glargine  14 Units Subcutaneous Daily  . metoCLOPramide  5 mg Oral Q8H  . midodrine  5 mg Oral TID WC  . multivitamin  1 tablet Oral QHS  . Muscle Rub   Topical BID  . pantoprazole  40 mg Oral Daily  . polyethylene glycol  17 g Oral BID  . rosuvastatin  20 mg Oral Daily  . scopolamine  1 patch Transdermal Q72H  . senna-docusate  2 tablet Oral BID  . sorbitol  30 mL Oral Daily  . Warfarin - Pharmacist Dosing Inpatient   Does not apply q1600    Infusions: . sodium chloride 10 mL (11/17/20 1430)    PRN Medications: sodium chloride, acetaminophen, alteplase, alum & mag hydroxide-simeth, bisacodyl, dextrose, diphenhydrAMINE, guaiFENesin-dextromethorphan, heparin, lidocaine-prilocaine, ondansetron (ZOFRAN) IV, oxyCODONE, pentafluoroprop-tetrafluoroeth, prochlorperazine **OR** prochlorperazine **OR** prochlorperazine, sodium chloride flush, sodium phosphate, traZODone, white petrolatum    Assessment/Plan   1.Acute on chronic systolic HF -> cardiogenic  shock:ICM.HasMedtronic ICD. EF 15%. Low output persisted despite milrinone and then dobutamine with rise in creatinine to 2.5. Impella 5.5 placed 3/1.  Creatinine improved to 1.7 and HM3 LVAD was placed on 3/10. Developed post-op renal failure requiring CVVH. Now off milrinone and epinephrine. Last echo showed moderate RV enlargement with mildly decreased RV function and VAD speed decreased to 5300 due to left-sided septum. CRRT stopped 3/23 but restarted 3/24. CVVH stopped again on 3/27. Ramp echo 3/30 with speed decreased to 5200. Tolerating iHD for volume management.  MAP 80s-90s.  - Continue midodrine to 5 tid.   - Off sildenafil with orthostasis.  - Continue compression stockings.  - HD  for volume management, next tomorrow.  2. LVAD: 3/13 VAD speed increased to 5500. 3/23 speed decreased to 5300 and decreased to 5200 on 3/30. VAD interrogated personally. LDH 174, INR 2.4 today  - On warfarin, on ASA 81 daily.  Discussed warfarin dosing with PharmD personally. - DL site ok 3. CAD s/p CABG2002:Last cath in 6/19 with patent grafts. - Continue Crestor 40 mg daily.  4. AKI on CKD Stage 3b: Suspect that this is a combination of cardiorenal syndrome and diabetic nephropathy and possibly ATN. Developed post-op AKI. Started on CRRT 3/15. CVVH stopped 3/23 but restarted 3/24. Now getting iHD, looks like he will be HD-dependent.  Has tunneled catheter.  - HD T/Th/S - Dr Augustin Coupe to talk with VVS, but with continuous flow LVAD, think AV fistula would be less likely to mature.  Will await decision by VVS and nephrology in terms of access.  5. Atrial flutter/fibrillation: s/p DC-CV 08/21/18.  - Continue amio 200 mg daily.   - Remains in NSR 6. Wound R Foot: Partial thickness skin loss noted. Cover with mepilex border and change every 5 days. Resolved.  7. Right arm pain: Likely neuropathic from stretch of brachial plexus with Impella placement.  - Continue gabapentin.  8. ID:  WBCs normal. Afebrile. Blood  Cx- NGTD.  9. MH:DQQIWLN of very poor control but hgbA1ctrending down. Most recent 7.7%.  Insulin drip stopped 3/24. Blood glucose controlled on reduced Lantus and  sliding scale insulin to moderate.   - Diabetes coordinators have seen 10. Anemia: Has been stable. hgb 8.6 on 4/18 11. Deconditioning - Continue to work with CIR 12. Unstageable Pressure Ulcer on buttocks: Much improved - Hydrotherapy completed - Continue repositioning 13. NSVT: No longer on the monitor.  14. Dizziness: Improved 15. Constipation: Resolved but did not sleep due to multiple BMs.  Will need to continue stool softener to keep him regular.    Loralie Champagne, MD  10:24 AM

## 2020-11-21 NOTE — Progress Notes (Signed)
ANTICOAGULATION CONSULT NOTE - Follow Up Consult  Pharmacy Consult for Warfarin Indication: LVAD  Allergies  Allergen Reactions  . Meclizine Hcl Anaphylaxis and Swelling  . Ivabradine Nausea Only    Patient Measurements: Height: 5' 10.5" (179.1 cm) Weight: 84.1 kg (185 lb 6.4 oz) IBW/kg (Calculated) : 74.15 kg  Vital Signs: Temp: 97.5 F (36.4 C) (04/18 1330) BP: 83/66 (04/18 1330) Pulse Rate: 68 (04/18 1330)  Labs: Recent Labs    11/19/20 0435 11/20/20 0418 11/21/20 0508  HGB 8.4*  --  8.6*  HCT 27.9*  --  28.3*  PLT 202  --  190  LABPROT 22.1* 23.5* 25.9*  INR 1.9* 2.1* 2.4*  CREATININE 5.88* 4.08* 5.52*    Estimated Creatinine Clearance: 14.9 mL/min (A) (by C-G formula based on SCr of 5.52 mg/dL (H)).   Medical History: Past Medical History:  Diagnosis Date  . AICD (automatic cardioverter/defibrillator) present    Single-chamber  implantable cardiac defibrillator - Medtronic  . Atrial fibrillation ([place])   . Cataract    Mixed form OU  . CHF (congestive heart failure) ([place])   . Chronic kidney disease   . Chronic kidney disease (CKD), stage III (moderate) (HCC)   . Chronic systolic heart failure ([place])    a. Echo 5/13: Mild LVE, mild LVH, EF 10%, anteroseptal, lateral, apical AK, mild MR, mild LAE, moderately reduced RVSF, mild RAE, PASP 60;  b. 07/2014 Echo: EF 20-2%, diff HK, AKI of antsep/apical/mid-apicalinferior, mod reduced RV.  Marland Kitchen Coronary artery disease    a. s/p CABG 2002 b. LHC 5/13:  dLM 80%, LAD subtotally occluded, pCFX occluded, pRCA 50%, mid? Occlusion with high take off of the PDA with 70% multiple lesions-not bypassed and supplies collaterals to LAD, LIMA-IM/ramus ok, S-OM ok, S-PLA branches ok. Medical therapy was recommended  . COVID   . Diabetic retinopathy (HCC)    NPDR OD, PDR OS  . Dyspnea   . Gout    "on daily RX" (01/08/2018)  . Hypertension   . Hypertensive retinopathy    OU  . Ischemic cardiomyopathy    a. Echo 5/13: Mild LVE,  mild LVH, EF 10%, anteroseptal, lateral, apical AK, mild MR, mild LAE, moderately reduced RVSF, mild RAE, PASP 60;  b. 01/2012 s/p MDT [id number] Protecta XT VR AICD;  c. 07/2014 Echo: EF 20-2%, diff HK, AKI of antsep/apical/mid-apicalinferior, mod reduced RV.  Marland Kitchen MRSA (methicillin resistant Staphylococcus aureus)    Status post right foot plantar deep infection with MRSA status post  I&D 10/2008  . Myocardial infarction [facility])    "was told I'd had several before heart OR in 2002" (01/08/2018)  . Noncompliance   . Peripheral neuropathy   . Retinopathy, diabetic, background ([place])   . Syncope   . Type II diabetes mellitus ([place])    requiring insulin   . Vitreous hemorrhage, left (HCC)    and proliferative diabetic retinopathy    Assessment: 60 yo male s/p new LVAD implant with HeartMate III on 3/10. Warfarin started per MD on 3/12, and pharmacy has now been consulted to take over warfarin dosing.   INR today is therapeutic at 2.4. The patient has been very sensitive to small warfarin dose changes. The patient's diet has remained stable and no new interacting medications have been started. CBC and LDH are stable.   Per most recent VVS note and previous pharmacist's discussion with Dr. Aundra Dubin, no confirmed plans for permanent vascular access as of yet. Leota placed 11/07/20 with IR. Will continue warfarin until  further notice.  Goal of Therapy: INR 2-2.5 Monitor platelets by anticoagulation protocol: Yes   Plan:  Give warfarin PO 3 mg tonight x 1 dose Obtain daily PT/INR and CBC Monitor for signs and symptoms of bleeding  Erin Hearing PharmD., BCPS Clinical Pharmacist 11/21/2020 2:02 PM

## 2020-11-21 NOTE — Progress Notes (Signed)
Physical Therapy Session Note  Patient Details  Name: Marcus Johnson. MRN: [id number] Date of Birth: Dec 07, 1960  Today's Date: 11/21/2020 PT Individual Time: 0800-0830 PT Individual Time Calculation (min): 30 min   Short Term Goals: Week 1:  PT Short Term Goal 1 (Week 1): Patient will perform bed mobility with min A consistently. PT Short Term Goal 2 (Week 1): Patient will perform basic transfers with CGA using LRAD. PT Short Term Goal 3 (Week 1): Patient will ambulate >50 ft without symptoms using LRAD. PT Short Term Goal 4 (Week 1): Patient will tolerate sitting OOB >1 hour. PT Short Term Goal 5 (Week 1): Patient will initate stair training.  Skilled Therapeutic Interventions/Progress Updates:     Patient in bed upon PT arrival. Patient alert and agreeable to unscheduled PT session to make up missed time. Reported 6/10 R elbow and forearm pain and 2/10 sacral pain, RN made aware. PT provided repositioning, rest breaks, and distraction as pain interventions throughout session.   Performed R elbow flexion extension PROM, noted hard stop at ~10 deg lacking full extension and ~90 deg flexion, patient denied pain, but also unable to perform AROM past those ranges as well. Performed prolonged stretch without change in each direction x1 min. Prepared to don patient's TED hose, B feet with significant dryness with significant skin flaking off. Performed and educated on foot hygiene, as patient has diminished sensation bilaterally. Thoroughly washed and dried his feet, educating patient on technique, then applied lotion to his feet and ankles. Educated on importance of foot care due to infection risk and to promote mobility due to limitations from foot pain or injury. Also educated on wearing thick soled shoes for protection at all times when OOB. Patient stated understanding. Discussed asking medical team about follow-up with a podiatrist in outpatient due to hard calluses on the plantar  surface of his R midfoot and lateral to the fifth metatarsal on his L foot. Donned B TED hose with total A.   Patient in bed with IV nurse in the room at end of session with breaks locked and all needs within reach.    Therapy Documentation Precautions:  Precautions Precautions: Fall,Sternal,Other (comment) Precaution Comments: LVAD, sacral wound, Left BPPV Required Braces or Orthoses: Other Brace Other Brace: thumb loop and buddy strap R hand Restrictions Weight Bearing Restrictions: Yes RUE Weight Bearing: Partial weight bearing Other Position/Activity Restrictions: sternal precautions   Therapy/Group: Individual Therapy  Marthella Osorno L Tuyet Bader PT, DPT  11/21/2020, 11:54 AM

## 2020-11-21 NOTE — Progress Notes (Signed)
Patient ID: Marcus Johnson., male   DOB: 1961-07-30, 60 y.o.   MRN: [id number]  SW sent Access [place] application to [employer] for transportation. SW informed dialysis location pending, due to pt requiring safety acceptance for outpatient HD at [facility].   SW provided pt with Medicaid application, HHA list, and informed on access [place] application sent.   Loralee Pacas, MSW, [place] Office: [phone] Cell: [phone] Fax: [phone]

## 2020-11-21 NOTE — Progress Notes (Signed)
PROGRESS NOTE   Subjective/Complaints: Nauseas yesterday, feeling better after bowel movements over night. reglan resume. Still dizzy when he sits up/stands up. Sugars low as he didn't eat much yesterday d/t nausea  ROS: Patient denies fever, rash, sore throat, blurred vision,   diarrhea, cough,   chest pain, joint or back pain, headache, or mood change.    Objective:   DG Abd Portable 1V  Result Date: 11/20/2020 CLINICAL DATA:  Nausea and vomiting EXAM: PORTABLE ABDOMEN - 1 VIEW COMPARISON:  10/17/2020 FINDINGS: Changes consistent with LVAD are again seen and stable. Feeding catheter has been removed in the interval. Defibrillator is noted. Scattered large and small bowel gas is noted. Retained fecal material is seen consistent with a degree of mild constipation. No obstructive changes are noted. IMPRESSION: Changes of mild constipation.  No obstruction is seen. Electronically Signed   By: Inez Catalina M.D.   On: 11/20/2020 17:20   Recent Labs    11/19/20 0435 11/21/20 0508  WBC 9.5 9.3  HGB 8.4* 8.6*  HCT 27.9* 28.3*  PLT 202 190   Recent Labs    11/20/20 0418 11/21/20 0508  NA 135 135  K 3.9 4.0  CL 96* 95*  CO2 30 28  GLUCOSE 86 79  BUN 29* 43*  CREATININE 4.08* 5.52*  CALCIUM 9.0 9.3    Intake/Output Summary (Last 24 hours) at 11/21/2020 0927 Last data filed at 11/21/2020 0734 Gross per 24 hour  Intake 640 ml  Output --  Net 640 ml     Pressure Injury 10/16/20 Coccyx Mid Stage 2 -  Partial thickness loss of dermis presenting as a shallow open injury with a red, pink wound bed without slough. Blister that has drained, 2 cm X 4.5 cm (Active)  10/16/20 0800  Location: Coccyx  Location Orientation: Mid  Staging: Stage 2 -  Partial thickness loss of dermis presenting as a shallow open injury with a red, pink wound bed without slough.  Wound Description (Comments): Blister that has drained, 2 cm X 4.5 cm   Present on Admission: No     Pressure Injury 10/16/20 Coccyx Right Stage 2 -  Partial thickness loss of dermis presenting as a shallow open injury with a red, pink wound bed without slough. blister that has drained, 0.5 cm X 0.5 cm (Active)  10/16/20 0800  Location: Coccyx  Location Orientation: Right  Staging: Stage 2 -  Partial thickness loss of dermis presenting as a shallow open injury with a red, pink wound bed without slough.  Wound Description (Comments): blister that has drained, 0.5 cm X 0.5 cm  Present on Admission: No    Physical Exam: Vital Signs Blood pressure 105/75, pulse 84, temperature 97.8 F (36.6 C), resp. rate 20, height 5' 10.5" (1.791 m), weight 84.1 kg, SpO2 97 %. Constitutional: No distress . Vital signs reviewed. HEENT: EOMI, oral membranes moist Neck: supple Cardiovascular:LVAD hum.     Respiratory/Chest: CTA Bilaterally without wheezes or rales. Normal effort    GI/Abdomen: BS +,  tender, non-distended Ext: no clubbing, cyanosis, or edema Psych: pleasant and cooperative Skin: tunneled cath right shoulder in place. Right foot intact. Sacral wound  superficial with  predominant granulation tissue  Neuro: Pt is cognitively appropriate with normal insight, memory, and awareness. Cranial nerves 2-12 are intact. Sensory exam is normal. Reflexes are 2+ in all 4's. Fine motor coordination is intact. R>L UE tremors. Motor function is grossly 4/5 with tremor. RUE  Remains 3-4/5 but still limited d/t pain in shoulder. has scattered sensory loss throughout right arm. LE motor 3-4/5  Musculoskeletal: sensitivity right elbow    Assessment/Plan: 1. Functional deficits which require 3+ hours per day of interdisciplinary therapy in a comprehensive inpatient rehab setting.  Physiatrist is providing close team supervision and 24 hour management of active medical problems listed below.  Physiatrist and rehab team continue to assess barriers to discharge/monitor patient  progress toward functional and medical goals  Care Tool:  Bathing  Bathing activity did not occur:  (time constraints) Body parts bathed by patient: Left arm,Abdomen,Chest,Front perineal area,Right upper leg,Left upper leg,Face   Body parts bathed by helper: Right arm,Buttocks,Left lower leg,Right lower leg     Bathing assist Assist Level: Moderate Assistance - Patient 50 - 74%     Upper Body Dressing/Undressing Upper body dressing Upper body dressing/undressing activity did not occur (including orthotics): Environmental limitations (PICC running) What is the patient wearing?: Hospital gown only    Upper body assist Assist Level: Moderate Assistance - Patient 50 - 74%    Lower Body Dressing/Undressing Lower body dressing      What is the patient wearing?: Incontinence brief     Lower body assist Assist for lower body dressing: Moderate Assistance - Patient 50 - 74%     Toileting Toileting Toileting Activity did not occur Landscape architect and hygiene only): N/A (no void or bm)  Toileting assist Assist for toileting: Dependent - Patient 0%     Transfers Chair/bed transfer  Transfers assist  Chair/bed transfer activity did not occur: Safety/medical concerns  Chair/bed transfer assist level: Minimal Assistance - Patient > 75% Chair/bed transfer assistive device: Programmer, multimedia   Ambulation assist   Ambulation activity did not occur: Safety/medical concerns  Assist level: Contact Guard/Touching assist Assistive device: Walker-rolling Max distance: 30   Walk 10 feet activity   Assist  Walk 10 feet activity did not occur: Safety/medical concerns  Assist level: Contact Guard/Touching assist Assistive device: Walker-rolling   Walk 50 feet activity   Assist Walk 50 feet with 2 turns activity did not occur: Safety/medical concerns  Assist level: Contact Guard/Touching assist Assistive device: Walker-rolling    Walk 150 feet  activity   Assist Walk 150 feet activity did not occur: Safety/medical concerns         Walk 10 feet on uneven surface  activity   Assist Walk 10 feet on uneven surfaces activity did not occur: Safety/medical concerns         Wheelchair     Assist Will patient use wheelchair at discharge?: No (Per PT long term goals)   Wheelchair activity did not occur: Safety/medical concerns         Wheelchair 50 feet with 2 turns activity    Assist    Wheelchair 50 feet with 2 turns activity did not occur: Safety/medical concerns       Wheelchair 150 feet activity     Assist  Wheelchair 150 feet activity did not occur: Safety/medical concerns       Blood pressure 105/75, pulse 84, temperature 97.8 F (36.6 C), resp. rate 20, height 5' 10.5" (1.791 m), weight 84.1 kg, SpO2 97 %.  Medical  Problem List and Plan: 1.Functional and mobility deficitssecondary to debility after acute on chronic CHF with subsequent LVAD placement -patient maynotshower -ELOS/Goals: ?2 weeks+   -Continue CIR therapies including PT, OT   -vestibular evaluation to assess for BPPV was somewhat inconclusive   -will (re)try scopolamine patch for symptom relief 2. Chronic systolic CHF s/p LVAD 3:  -DVT/anticoagulation:Pharmaceutical:Continue Coumadin, INR 2.1 on 4/17 -antiplatelet therapy: ASA 3.Right arm neuropathic pain/brachial plexus injury/Pain Management:Tylenol or oxycodone prn.  --Increased gabapentin to 200 mgat bedtime with some benefit -sling for RUE support and comfort  -expect good recovery of motor/sensory function in RUE  4. Mood:LCSW to follow for evaluation and support. -antipsychotic agents: N/A 5. Neuropsych: This patientiscapable of making decisions on hisown behalf. 6. Skin/Wound Care:Optimize nutritional status--continue Nephro and prosource.  --Sacral decubitus:  Air mattress.    -appears much improved-->WTD dressing   -pt is NO LONGER RECEIVING HYDRO --Right foot wound resolved.  7. Fluids/Electrolytes/Nutrition:Strict I/O. Continue to monitor labs with HD? 8. Chronic systolic CHF s/p LVAD 3:  --Cardiology to follow and manage cardiac issues.  -daily weights Filed Weights   11/19/20 1300 11/20/20 0450 11/21/20 0522  Weight: 88 kg 88 kg 84.1 kg    4/13 midodrine adjusted to 5mg  TID daily  4/15 bp/volume mgt per nephro and cards.    -off revatio   -TEDS, acclimation 9. Acute on chronic anemia: Continue to monitor H/H/ monitor for signs of bleeding.  --Was transfused with multiple units PRBC. --ON Nulecit Tu/Th/Sa  -transfuse as needed in HD 8.6 4/18 10. T2DM: Hgb A1c- monitor BS ac/hs.BS reasonable on current intake --continue Lantus 15 units/am with SSI for elevated BS.   4/17 CBG 65-118: decreased lantus to 14U  4/18 observe with better po intake today CBG (last 3)  Recent Labs    11/20/20 2122 11/21/20 0629 11/21/20 0702  GLUCAP 80 60* 79    11. ESRD: Hyperkalemia being managed with lokelma/HD. -On regular diet w/o FR --question due to intake. Phosphorous levels trending up.  --Scheduled HD at the end of the day to help with tolerance of activity during the day. --RD consulted for diet education (multiple restrictions) 12. Mild leucocytosis: Resolved  13. A fib: NSR, HR remains controlled on Amiodarone, continue 14. Nausea: likely d/t nausea  -reglan resumed yesterday  -feels better today  -discussed importance of regular bm's.   -on miralax and senna-s bid    LOS: 7 days A FACE TO [place] 11/21/2020, 9:27 AM

## 2020-11-21 NOTE — Progress Notes (Signed)
At 0630 blood sugar reading was 60. Patient is asymptomatic and responding appropriately. Nurse gave patient a glass of orange juice and breakfast was given to patient. At 0700 blood sugar reading was 79. Patient is currently eating breakfast and responding appropriately. Call button within reach.

## 2020-11-21 NOTE — Progress Notes (Signed)
Physical Therapy Note  Patient Details  Name: Marcus Johnson. MRN: [id number] Date of Birth: [birth year] Today's Date: 11/21/2020    Patient sleeping upon PT arrival. Per OT, patient reported that he did not sleep last night due to frequent BMs and required max A with transfers, usually requires min A. Discussed with RN and agreed to allow patient to rest at this time for improved performance with therapy later today. Will attempt to initiate PT session at a later time.    Delynn Pursley L Rebbeca Sheperd PT, DPT  11/21/2020, 10:51 AM

## 2020-11-21 NOTE — Progress Notes (Signed)
LVAD Coordinator Rounding Note:  HM III LVAD implanted on 10/13/20 by Dr Cyndia Bent under Destination Therapy criteria.  Discharged from [facility] and admitted to [facility] 11/14/20.  Pt lying in bed awake, pleasant, very talkative.   Pt states that he hasn't been able to do any therapy today because "I am too weak." pt states that he was unable to stand with assistance today.  No family at bedside for dressing change education.   Pt has HD dialysis scheduled on Tues/Thurs/Sat.   Vital signs: Temp: 97.5 HR: 84 Doppler: 84 Auto cuff: 105/75 (83) O2 Sat: 99% on RA Wt: 183.6>190.9>194>185.4 lbs    LVAD interrogation reveals:  Speed: 5200 Flow: 4.8 Power: 3.6w PI: 2.6 Hematocrit: 28  Alarms: none Events: 10 PI today  Fixed speed: 5200 Low speed limit: 4900  Drive Line:  Existing VAD dressing removed and site care performed using sterile technique. Drive line exit site cleaned with Chlora prep applicators x 2, allowed to dry, Acell placed in the driveline, along with ky jelly and gauze dressing withOUT silver stripapplied. Exit site healingand unincorporated, the velour is fully implanted at exit site.Small amount of serous drainage.No redness, tenderness, foul odor or rash noted. Drive line anchor re-applied. Will advance to twice weekly while using acell. Next dressing change will be due 11/24/20. No family available for dressing change.         Labs:  LDH trend: 199>191>196>180>174  INR trend: 1.5>1.7>1.9>2.4  Anticoagulation Plan: -INR Goal: 2.0-2.5 -ASA Dose: stopped 30 days post op per protocol   Device:  -  Medtronic single -  Pacing: VVI 40 - Therapies: on 200 bpm  Plan/Recommendations:  1. Call VAD coordinator for equipment or drive line issues 2. Twice weekly dressing changes while using acell. Next dressing due 11/24/20.   Tanda Rockers RN [place] Coordinator  Office: [phone]  24/7 Pager: [phone]

## 2020-11-21 NOTE — Progress Notes (Signed)
Occupational Therapy Session Note  Patient Details  Name: Marcus Johnson. MRN: [id number] Date of Birth: July 26, 1961  Today's Date: 11/21/2020 OT Individual Time: 4782-9562 OT Individual Time Calculation (min): 45 min  and Today's Date: 11/21/2020 OT Missed Time: 15 Minutes Missed Time Reason: Patient fatigue  Session 2: OT Individual Time: 1400-1430 OT Individual Time Calculation (min): 30 min  Short Term Goals: Week 1:  OT Short Term Goal 1 (Week 1): Pt will don pants with min A OT Short Term Goal 2 (Week 1): Pt will tolerate standing at the sink for ADL with no c/o light headness OT Short Term Goal 3 (Week 1): Pt will complete toilet transfer with min A  Skilled Therapeutic Interventions/Progress Updates:    Pt received supine with no c/o pain but reporting he did not sleep at all last night and is very fatigued. Pt declined ADLs but was agreeable to come EOB. Supervision for bed mobility with bed rail. Pt attempted to stand from EOB- 4x attempts and max A to stand. He was unable to maintain stand for more than several seconds reporting increased dizziness and that he "was falling". Abrupt sit back on EOB. Pt declined any further OOB attempts d/t fatigue. He was agreeable to practice LVAD cord management. Used extra monitor and battery clip for pt to practice lining up cords and turning cap. He required frequent cueing for orientation of cord and was unable to push in the cord, lacking both strength and [place]. Built up cord to attempt and increase ease. Checked in with MD to ensure safety of wrapping the cord with co-band. Pt reported fatigue was increasing and requested to return to supine. He was left with all needs met, sleeping soundly on his side.   Session 2: Pt supine with his son and brother present, looking much better than this morning- smiling and alert. Pt agreeable to session. Pt completed bed mobility to EOB with mod A. He initially had great difficulty maintaining static  sitting balance with mod A required to stabilize and mod cueing. After several minutes he was able to maintain sitting balance with supervision like his recent baseline. Allowed pt's son and brother to change pt LVAD from wall to battery powered for practice. Only min cueing provided to pt's brother. Pt completed sit >stand from EOB with CGA using RW. He completed 15 ft of functional mobility around room with RW with CGA. He returned to supine in bed and was left with all needs met. LVAD back on wall power.  MAP 90 when checked EOB.   Therapy Documentation Precautions:  Precautions Precautions: Fall,Sternal,Other (comment) Precaution Comments: LVAD, sacral wound, Left BPPV Required Braces or Orthoses: Other Brace Other Brace: thumb loop and buddy strap R hand Restrictions Weight Bearing Restrictions: Yes RUE Weight Bearing: Partial weight bearing Other Position/Activity Restrictions: sternal precautions  Therapy/Group: Individual Therapy  Curtis Sites 11/21/2020, 6:35 AM

## 2020-11-21 NOTE — Progress Notes (Signed)
Physical Therapy Note  Patient Details  Name: Marcus Johnson. MRN: [id number] Date of Birth: October 22, 1960 Today's Date: 11/21/2020    Returned to initiate therapy session. Patient sleeping soundly upon PT arrival. Patient aroused to verbal stimulation and requested to continue resting at this time. Patient in bed with all needs in reach. Patient missed 45 min of skilled PT due to fatigue, RN made aware. Will attempt to make-up missed time as able.      Daphene Chisholm L Shterna Laramee PT, DPT  11/21/2020, 12:20 PM

## 2020-11-22 DIAGNOSIS — Z95811 Presence of heart assist device: Secondary | ICD-10-CM | POA: Diagnosis not present

## 2020-11-22 DIAGNOSIS — R5381 Other malaise: Secondary | ICD-10-CM | POA: Diagnosis not present

## 2020-11-22 DIAGNOSIS — I5022 Chronic systolic (congestive) heart failure: Secondary | ICD-10-CM | POA: Diagnosis not present

## 2020-11-22 DIAGNOSIS — I951 Orthostatic hypotension: Secondary | ICD-10-CM | POA: Diagnosis not present

## 2020-11-22 LAB — RENAL FUNCTION PANEL
Albumin: 2.7 g/dL — ABNORMAL LOW (ref 3.5–5.0)
Anion gap: 12 (ref 5–15)
BUN: 60 mg/dL — ABNORMAL HIGH (ref 6–20)
CO2: 27 mmol/L (ref 22–32)
Calcium: 9 mg/dL (ref 8.9–10.3)
Chloride: 95 mmol/L — ABNORMAL LOW (ref 98–111)
Creatinine, Ser: 6.86 mg/dL — ABNORMAL HIGH (ref 0.61–1.24)
GFR, Estimated: 9 mL/min — ABNORMAL LOW (ref >60)
Glucose, Bld: 96 mg/dL (ref 70–99)
Phosphorus: 6.8 mg/dL — ABNORMAL HIGH (ref 2.5–4.6)
Potassium: 4.9 mmol/L (ref 3.5–5.1)
Sodium: 134 mmol/L — ABNORMAL LOW (ref 135–145)

## 2020-11-22 LAB — CBC
HCT: 27.6 % — ABNORMAL LOW (ref 39.0–52.0)
Hemoglobin: 8.5 g/dL — ABNORMAL LOW (ref 13.0–17.0)
MCH: 27.4 pg (ref 26.0–34.0)
MCHC: 30.8 g/dL (ref 30.0–36.0)
MCV: 89 fL (ref 80.0–100.0)
Platelets: 174 10*3/uL (ref 150–400)
RBC: 3.1 MIL/uL — ABNORMAL LOW (ref 4.22–5.81)
RDW: 17.2 % — ABNORMAL HIGH (ref 11.5–15.5)
WBC: 9.5 10*3/uL (ref 4.0–10.5)
nRBC: 0 % (ref 0.0–0.2)

## 2020-11-22 LAB — GLUCOSE, CAPILLARY
Glucose-Capillary: 86 mg/dL (ref 70–99)
Glucose-Capillary: 84 mg/dL (ref 70–99)
Glucose-Capillary: 72 mg/dL (ref 70–99)
Glucose-Capillary: 121 mg/dL — ABNORMAL HIGH (ref 70–99)

## 2020-11-22 LAB — LACTATE DEHYDROGENASE: LDH: 168 U/L (ref 98–192)

## 2020-11-22 LAB — PROTIME-INR
INR: 3.2 — ABNORMAL HIGH (ref 0.8–1.2)
Prothrombin Time: 32.9 seconds — ABNORMAL HIGH (ref 11.4–15.2)

## 2020-11-22 LAB — MAGNESIUM: Magnesium: 2.2 mg/dL (ref 1.7–2.4)

## 2020-11-22 MED ORDER — HEPARIN SODIUM (PORCINE) 1000 UNIT/ML IJ SOLN
INTRAMUSCULAR | Status: AC
  Filled 2020-11-22: qty 4

## 2020-11-22 MED ORDER — GUAIFENESIN ER 600 MG PO TB12
600.0000 mg | ORAL_TABLET | Freq: Two times a day (BID) | ORAL | Status: DC
  Administered 2020-11-22 – 2020-12-02 (×18): 600 mg via ORAL
  Filled 2020-11-22 (×20): qty 1

## 2020-11-22 MED ORDER — DARBEPOETIN ALFA 100 MCG/0.5ML IJ SOSY
PREFILLED_SYRINGE | INTRAMUSCULAR | Status: AC
  Administered 2020-11-22: 100 ug via INTRAVENOUS
  Filled 2020-11-22: qty 0.5

## 2020-11-22 NOTE — Progress Notes (Signed)
Renal Navigator and RN Dialysis Coordinator met with patient at bedside to check in and offer support. Patient was welcoming and in good spirits. He states he tolerated HD in recliner last Thursday, but even better on Saturday. He says his bottom hurts, but he will do whatever is needed to make progress and discharge home. He states he is walking "about 100 ft" everyday, and the only complaint today is his right hand. He says that it is a hindrance to progress in therapy. He showed [place] how he is having trouble gripping. Navigator encouraged him to keep telling staff how he is doing and about anything that is troubling him.  Navigator provided update to Dr. Joelyn Oms on how conversant patient is today and has faxed notes to him to keep him updated on progress/tolerating HD in chair. Navigator also updated Dr. Joelyn Oms on targeted discharge date of 5/2, though patient cannot discharge until outpatient HD is secured.  Navigator will continue to follow.   Alphonzo Cruise, [place] Renal Navigator [phone]

## 2020-11-22 NOTE — Progress Notes (Signed)
ANTICOAGULATION CONSULT NOTE - Follow Up Consult  Pharmacy Consult for Warfarin Indication: LVAD  Allergies  Allergen Reactions  . Meclizine Hcl Anaphylaxis and Swelling  . Ivabradine Nausea Only    Patient Measurements: Height: 5' 10.5" (179.1 cm) Weight: 85.5 kg (188 lb 9.6 oz) IBW/kg (Calculated) : 74.15 kg  Vital Signs: Temp: 97.7 F (36.5 C) (04/19 0334) Temp Source: Oral (04/19 0334) Pulse Rate: 74 (04/19 0334)  Labs: Recent Labs    11/20/20 0418 11/21/20 0508 11/22/20 0449  HGB  --  8.6* 8.5*  HCT  --  28.3* 27.6*  PLT  --  190 174  LABPROT 23.5* 25.9* 32.9*  INR 2.1* 2.4* 3.2*  CREATININE 4.08* 5.52* 6.86*    Estimated Creatinine Clearance: 12 mL/min (A) (by C-G formula based on SCr of 6.86 mg/dL (H)).   Assessment: 60 yo male s/p new LVAD implant with HeartMate III on 3/10. Warfarin started per MD on 3/12, and pharmacy has now been consulted to take over warfarin dosing.   INR today is up above goal at 3.2. The patient has been very sensitive to small warfarin dose changes and dose was reduced last night. The patient's diet has remained stable but nausea noted this morning. No bleeding issues noted.   Will hold dose today to allow INR to drift down.   Goal of Therapy: INR 2-2.5 Monitor platelets by anticoagulation protocol: Yes   Plan:  Hold warfarin tonight Obtain daily PT/INR and CBC Monitor for signs and symptoms of bleeding  Erin Hearing PharmD., BCPS Clinical Pharmacist 11/22/2020 11:27 AM

## 2020-11-22 NOTE — Progress Notes (Signed)
PROGRESS NOTE   Subjective/Complaints: Awoke with nausea today. Stated that it happens often earloy in the morning and is related to "coughing up mucous" which seems to trigger. Nausea is not related to dizziness. Dizziness seems to come on when he's standing and he just feels "heavy" and that legs won't move.   ROS: Patient denies fever, rash, sore throat, blurred vision,   diarrhea, cough,   chest pain, joint or back pain, headache, or mood change. .    Objective:   DG Abd Portable 1V  Result Date: 11/20/2020 CLINICAL DATA:  Nausea and vomiting EXAM: PORTABLE ABDOMEN - 1 VIEW COMPARISON:  10/17/2020 FINDINGS: Changes consistent with LVAD are again seen and stable. Feeding catheter has been removed in the interval. Defibrillator is noted. Scattered large and small bowel gas is noted. Retained fecal material is seen consistent with a degree of mild constipation. No obstructive changes are noted. IMPRESSION: Changes of mild constipation.  No obstruction is seen. Electronically Signed   By: Inez Catalina M.D.   On: 11/20/2020 17:20   Recent Labs    11/21/20 0508 11/22/20 0449  WBC 9.3 9.5  HGB 8.6* 8.5*  HCT 28.3* 27.6*  PLT 190 174   Recent Labs    11/21/20 0508 11/22/20 0449  NA 135 134*  K 4.0 4.9  CL 95* 95*  CO2 28 27  GLUCOSE 79 96  BUN 43* 60*  CREATININE 5.52* 6.86*  CALCIUM 9.3 9.0    Intake/Output Summary (Last 24 hours) at 11/22/2020 0922 Last data filed at 11/22/2020 0734 Gross per 24 hour  Intake 480 ml  Output --  Net 480 ml     Pressure Injury 10/16/20 Coccyx Mid Stage 3 -  Full thickness tissue loss. Subcutaneous fat may be visible but bone, tendon or muscle are NOT exposed. Blister that has drained, 2 cm X 4.5 cm (Active)  10/16/20 0800  Location: Coccyx  Location Orientation: Mid  Staging: Stage 3 -  Full thickness tissue loss. Subcutaneous fat may be visible but bone, tendon or muscle are NOT  exposed. (as per WOC note on 11/14/20)  Wound Description (Comments): Blister that has drained, 2 cm X 4.5 cm  Present on Admission: No     Pressure Injury 10/16/20 Coccyx Right Stage 2 -  Partial thickness loss of dermis presenting as a shallow open injury with a red, pink wound bed without slough. blister that has drained, 0.5 cm X 0.5 cm (Active)  10/16/20 0800  Location: Coccyx  Location Orientation: Right  Staging: Stage 2 -  Partial thickness loss of dermis presenting as a shallow open injury with a red, pink wound bed without slough.  Wound Description (Comments): blister that has drained, 0.5 cm X 0.5 cm  Present on Admission: No    Physical Exam: Vital Signs Blood pressure 100/72, pulse 74, temperature 97.7 F (36.5 C), temperature source Oral, resp. rate 20, height 5' 10.5" (1.791 m), weight 85.5 kg, SpO2 91 %. Constitutional: No distress . Vital signs reviewed. HEENT: EOMI, oral membranes moist Neck: supple Cardiovascular: RRR without murmur. No JVD    Respiratory/Chest: CTA Bilaterally without obvious wheezes or rales. Normal effort  GI/Abdomen: BS +, non-tender, non-distended Ext: no clubbing, cyanosis, or edema Psych: pleasant and cooperative Skin: tunneled cath right shoulder in place. Right foot intact. Sacral wound  superficial with predominant granulation tissue. Scopolamine patch fell off neck Neuro: Pt is cognitively appropriate with normal insight, memory, and awareness. Cranial nerves 2-12 are intact. Sensory exam is normal. Reflexes are 2+ in all 4's. Fine motor coordination is intact. R>L UE tremors. Motor function is grossly 4/5 with tremor. RUE  Remains 3-4/5 but still limited d/t pain in shoulder. has scattered sensory loss throughout right arm. LE motor 3-4/5  Musculoskeletal: sensitivity right elbow    Assessment/Plan: 1. Functional deficits which require 3+ hours per day of interdisciplinary therapy in a comprehensive inpatient rehab  setting.  Physiatrist is providing close team supervision and 24 hour management of active medical problems listed below.  Physiatrist and rehab team continue to assess barriers to discharge/monitor patient progress toward functional and medical goals  Care Tool:  Bathing  Bathing activity did not occur:  (time constraints) Body parts bathed by patient: Left arm,Abdomen,Chest,Front perineal area,Right upper leg,Left upper leg,Face   Body parts bathed by helper: Right arm,Buttocks,Left lower leg,Right lower leg     Bathing assist Assist Level: Moderate Assistance - Patient 50 - 74%     Upper Body Dressing/Undressing Upper body dressing Upper body dressing/undressing activity did not occur (including orthotics): Environmental limitations (PICC running) What is the patient wearing?: Hospital gown only    Upper body assist Assist Level: Moderate Assistance - Patient 50 - 74%    Lower Body Dressing/Undressing Lower body dressing      What is the patient wearing?: Incontinence brief     Lower body assist Assist for lower body dressing: Moderate Assistance - Patient 50 - 74%     Toileting Toileting Toileting Activity did not occur Landscape architect and hygiene only): N/A (no void or bm)  Toileting assist Assist for toileting: Dependent - Patient 0%     Transfers Chair/bed transfer  Transfers assist  Chair/bed transfer activity did not occur: Safety/medical concerns  Chair/bed transfer assist level: Minimal Assistance - Patient > 75% Chair/bed transfer assistive device: Programmer, multimedia   Ambulation assist   Ambulation activity did not occur: Safety/medical concerns  Assist level: Contact Guard/Touching assist Assistive device: Walker-rolling Max distance: 30   Walk 10 feet activity   Assist  Walk 10 feet activity did not occur: Safety/medical concerns  Assist level: Contact Guard/Touching assist Assistive device: Walker-rolling   Walk 50  feet activity   Assist Walk 50 feet with 2 turns activity did not occur: Safety/medical concerns  Assist level: Contact Guard/Touching assist Assistive device: Walker-rolling    Walk 150 feet activity   Assist Walk 150 feet activity did not occur: Safety/medical concerns         Walk 10 feet on uneven surface  activity   Assist Walk 10 feet on uneven surfaces activity did not occur: Safety/medical concerns         Wheelchair     Assist Will patient use wheelchair at discharge?: No (Per PT long term goals)   Wheelchair activity did not occur: Safety/medical concerns         Wheelchair 50 feet with 2 turns activity    Assist    Wheelchair 50 feet with 2 turns activity did not occur: Safety/medical concerns       Wheelchair 150 feet activity     Assist  Wheelchair 150 feet activity did not occur:  Safety/medical concerns       Blood pressure 100/72, pulse 74, temperature 97.7 F (36.5 C), temperature source Oral, resp. rate 20, height 5' 10.5" (1.791 m), weight 85.5 kg, SpO2 91 %.  Medical Problem List and Plan: 1.Functional and mobility deficitssecondary to debility after acute on chronic CHF with subsequent LVAD placement -patient maynotshower -ELOS/Goals: ?2 weeks+   -Continue CIR therapies including PT, OT   -vestibular evaluation inconclusive   - scopolamine patch for symptom relief (needs new one) 2. Chronic systolic CHF s/p LVAD 3:  -DVT/anticoagulation:Pharmaceutical:Continue Coumadin, INR 2.1 on 4/17 -antiplatelet therapy: ASA 3.Right arm neuropathic pain/brachial plexus injury/Pain Management:Tylenol or oxycodone prn.  --Increased gabapentin to 200 mgat bedtime with some benefit -sling for RUE support and comfort  -expect good recovery of motor/sensory function in RUE  4. Mood:LCSW to follow for evaluation and support. -antipsychotic agents:  N/A 5. Neuropsych: This patientiscapable of making decisions on hisown behalf. 6. Skin/Wound Care:Optimize nutritional status--continue Nephro and prosource.  --Sacral decubitus: Air mattress.    -appears much improved-->WTD dressing   -pt is NO LONGER RECEIVING HYDRO --Right foot wound resolved.  7. Fluids/Electrolytes/Nutrition:Strict I/O. Continue to monitor labs with HD? 8. Chronic systolic CHF s/p LVAD 3:  --Cardiology to follow and manage cardiac issues.  -daily weights Filed Weights   11/20/20 0450 11/21/20 0522 11/22/20 0500  Weight: 88 kg 84.1 kg 85.5 kg    4/13 midodrine adjusted to 5mg  TID daily  4/18 bp/volume mgt per nephro and cards.    -off revatio   -TEDS, acclimation  4/19 check MAP in standing, suspect that "dizziness" is vascular, +/- anxiety 9. Acute on chronic anemia: Continue to monitor H/H/ monitor for signs of bleeding.  --Was transfused with multiple units PRBC. --ON Nulecit Tu/Th/Sa  -transfuse as needed in HD 8.6 4/18 10. T2DM: Hgb A1c- monitor BS ac/hs.BS reasonable on current intake --continue Lantus 15 units/am with SSI for elevated BS.   4/17 CBG 65-118: decreased lantus to 14U  4/19 borderline fair control today CBG (last 3)  Recent Labs    11/21/20 1655 11/21/20 2110 11/22/20 0625  GLUCAP 158* 146* 86    11. ESRD: Hyperkalemia being managed with lokelma/HD. -On regular diet w/o FR --question due to intake. Phosphorous levels trending up.  --Scheduled HD at the end of the day to help with tolerance of activity during the day. --RD consulted for diet education (multiple restrictions) 12. Mild leucocytosis: Resolved  13. A fib: NSR, HR remains controlled on Amiodarone, continue 14. Nausea:   -reglan resumed yesterday  -triggered by productive cough?---add mucinex  -needs to have regular bm's.   -on miralax and  senna-s bid    LOS: 8 days A FACE TO [place] 11/22/2020, 9:22 AM

## 2020-11-22 NOTE — Progress Notes (Signed)
Patient ID: Marcus Johnson., male   DOB: [birth year], 60 y.o.   MRN: [id number]  SW went by pt room to provide updates from team conference, however, pt off floor for dialysis.   SW updated pt dialysis coord-Marcus Johnson on anticipated d/c date 5/2. Reports dialysis seat pending at this time.   SW called [name] [phone]) to provide updates from team conference, importance pt of pt being in recliner during dialysis, and dialysis center pending at this time due to medical (ie. Tolerating being in recliner in outpatient clinic). SW informed will continue to provide updates from team conference.   Loralee Pacas, MSW, [place] Office: [phone] Cell: [phone] Fax: [phone]

## 2020-11-22 NOTE — Progress Notes (Signed)
Patient ID: Marcus Lader., male   DOB: 01-03-1961, 60 y.o.   MRN: [id number]     Advanced Heart Failure Rounding Note  PCP-Cardiologist: No primary care provider on file.   Subjective:    - 2/27 Milrinone switched to dobutamine 5 mcg.  - 2/28 Moved to ICU. Dobutamine increased to 7.5 mcg and Norepi 3 mcg added. Diuretics held.  - 3/1 Impella 5.5 placed.  - 3/4 Teeth removed - 3/10 HM3 LVAD placed - 3/13 VAD speed increased to 5500. [name] out  - 3/14 Nephrology consulted. Given 1UPRBCs  - 3/15 Started CRRT. Given 1UPRBCs.  - 3/23 VAD Speed turned down to 5300  - 3/23 CRRT and Milrinone Stopped. Back in Afib - 3/24 CRRT restarted.  - 3/27 CVVH off.  - 3/28 iHD - 3/29 iHD. Walked 75 feet!! - 3/20 Ramp Echo: Fixed speed: 5200 Low speed limit: 4900 - 4/3 S/P RIJ HD catheter.  - 4/10 60th B-day!  - 4/11 Transferred to CIR. Back on heparin drip.   Nausea with emesis last night, now better.  Still gets vertigo-type symptoms when he turns his head right.    INR 3.2, No bleeding   LVAD Interrogation HM 3: Speed: 5200 Flow: 4.3 PI: 4.1  Power: 3.7.  About 10 PI events/24 hrs. VAD interrogated personally. Parameters stable.   Objective:   Weight Range: 85.5 kg Body mass index is 26.68 kg/m.   Vital Signs:   Temp:  [97.5 F (36.4 C)-99 F (37.2 C)] 97.7 F (36.5 C) (04/19 0334) Pulse Rate:  [68-100] 74 (04/19 0334) Resp:  [16-20] 20 (04/19 0334) BP: (83-101)/(58-85) 100/72 (04/18 1928) SpO2:  [91 %-97 %] 91 % (04/19 0334) Weight:  [85.5 kg] 85.5 kg (04/19 0500) Last BM Date: 11/21/20  Weight change: Filed Weights   11/20/20 0450 11/21/20 0522 11/22/20 0500  Weight: 88 kg 84.1 kg 85.5 kg    Intake/Output:   Intake/Output Summary (Last 24 hours) at 11/22/2020 1110 Last data filed at 11/22/2020 0734 Gross per 24 hour  Intake 480 ml  Output --  Net 480 ml      Physical Exam   General: Well appearing this am. NAD.  HEENT: Normal. Neck: Supple, JVP 8-9  cm. Carotids OK.  Cardiac:  Mechanical heart sounds with LVAD hum present.  Lungs:  CTAB, normal effort.  Abdomen:  NT, ND, no HSM. No bruits or masses. +BS  LVAD exit site: Well-healed and incorporated. Dressing dry and intact. No erythema or drainage. Stabilization device present and accurately applied. Driveline dressing changed daily per sterile technique. Extremities:  Warm and dry. No cyanosis, clubbing, rash, or edema.  Neuro:  Alert & oriented x 3. Cranial nerves grossly intact. Moves all 4 extremities w/o difficulty. Affect pleasant     Labs    CBC Recent Labs    11/21/20 0508 11/22/20 0449  WBC 9.3 9.5  HGB 8.6* 8.5*  HCT 28.3* 27.6*  MCV 89.3 89.0  PLT 190 169   Basic Metabolic Panel Recent Labs    11/21/20 0508 11/22/20 0449  NA 135 134*  K 4.0 4.9  CL 95* 95*  CO2 28 27  GLUCOSE 79 96  BUN 43* 60*  CREATININE 5.52* 6.86*  CALCIUM 9.3 9.0  MG 2.2 2.2  PHOS 5.7* 6.8*   Liver Function Tests Recent Labs    11/21/20 0508 11/22/20 0449  ALBUMIN 2.7* 2.7*   No results for input(s): LIPASE, AMYLASE in the last 72 hours. Cardiac Enzymes No results for  input(s): CKTOTAL, CKMB, CKMBINDEX, TROPONINI in the last 72 hours.  BNP: BNP (last 3 results) Recent Labs    11/02/20 2310 11/10/20 0150 11/17/20 0411  BNP 486.0* 905.1* 630.2*    ProBNP (last 3 results) No results for input(s): PROBNP in the last 8760 hours.   D-Dimer No results for input(s): DDIMER in the last 72 hours. Hemoglobin A1C No results for input(s): HGBA1C in the last 72 hours. Fasting Lipid Panel No results for input(s): CHOL, HDL, LDLCALC, TRIG, CHOLHDL, LDLDIRECT in the last 72 hours. Thyroid Function Tests No results for input(s): TSH, T4TOTAL, T3FREE, THYROIDAB in the last 72 hours.  Invalid input(s): FREET3  Other results:   Imaging    No results found.   Medications:     Scheduled Medications: . (feeding supplement) PROSource Plus  30 mL Oral BID BM  .  amiodarone  200 mg Oral Daily  . vitamin C  1,000 mg Oral Daily  . Chlorhexidine Gluconate Cloth  6 each Topical Q0600  . darbepoetin (ARANESP) injection - DIALYSIS  100 mcg Intravenous Q Tue-HD  . feeding supplement (NEPRO CARB STEADY)  237 mL Oral BID BM  . gabapentin  100 mg Oral QHS  . guaiFENesin  600 mg Oral BID  . insulin aspart  0-15 Units Subcutaneous TID WC  . insulin aspart  0-5 Units Subcutaneous QHS  . insulin glargine  14 Units Subcutaneous Daily  . metoCLOPramide  5 mg Oral Q8H  . midodrine  5 mg Oral TID WC  . multivitamin  1 tablet Oral QHS  . Muscle Rub   Topical BID  . pantoprazole  40 mg Oral Daily  . polyethylene glycol  17 g Oral BID  . rosuvastatin  20 mg Oral Daily  . scopolamine  1 patch Transdermal Q72H  . senna-docusate  2 tablet Oral BID  . sorbitol  30 mL Oral Daily  . Warfarin - Pharmacist Dosing Inpatient   Does not apply q1600    Infusions: . sodium chloride 10 mL (11/17/20 1430)    PRN Medications: sodium chloride, acetaminophen, alteplase, alum & mag hydroxide-simeth, bisacodyl, dextrose, diphenhydrAMINE, guaiFENesin-dextromethorphan, heparin, lidocaine-prilocaine, ondansetron (ZOFRAN) IV, oxyCODONE, pentafluoroprop-tetrafluoroeth, prochlorperazine **OR** prochlorperazine **OR** prochlorperazine, sodium chloride flush, sodium phosphate, traZODone, white petrolatum    Assessment/Plan   1.Acute on chronic systolic HF -> cardiogenic shock:ICM.HasMedtronic ICD. EF 15%. Low output persisted despite milrinone and then dobutamine with rise in creatinine to 2.5. Impella 5.5 placed 3/1.  Creatinine improved to 1.7 and HM3 LVAD was placed on 3/10. Developed post-op renal failure requiring CVVH. Now off milrinone and epinephrine. Last echo showed moderate RV enlargement with mildly decreased RV function and VAD speed decreased to 5300 due to left-sided septum. CRRT stopped 3/23 but restarted 3/24. CVVH stopped again on 3/27. Ramp echo 3/30 with speed  decreased to 5200. Tolerating iHD for volume management.  MAP 80s-90s.  - Continue midodrine to 5 tid, check orthostatics and can increase if needed.   - Continue compression stockings.  - HD for volume management, next today.   2. LVAD: 3/13 VAD speed increased to 5500. 3/23 speed decreased to 5300 and decreased to 5200 on 3/30. VAD interrogated personally. LDH 168, INR 3.2 today  - On warfarin, on ASA 81 daily.  Discussed warfarin dosing with PharmD personally. - DL site ok 3. CAD s/p [id number]:Last cath in 6/19 with patent grafts. - Continue Crestor 40 mg daily.  4. AKI on CKD Stage 3b: Suspect that this is a combination of cardiorenal syndrome  and diabetic nephropathy and possibly ATN. Developed post-op AKI. Started on CRRT 3/15. CVVH stopped 3/23 but restarted 3/24. Now getting iHD, looks like he will be HD-dependent.  Has tunneled catheter.  - HD T/Th/S - Dr Augustin Coupe to talk with [facility], but with continuous flow LVAD, think AV fistula would be less likely to mature.  Will await decision by [facility] and nephrology in terms of access.  5. Atrial flutter/fibrillation: s/p DC-CV 08/21/18.  - Continue amio 200 mg daily.   - Remains in NSR 6. Wound R Foot: Partial thickness skin loss noted. Cover with mepilex border and change every 5 days. Resolved.  7. Right arm pain: Likely neuropathic from stretch of brachial plexus with Impella placement.  - Continue gabapentin.  8. ID:  WBCs normal. Afebrile. Blood Cx- NGTD.  9. IF:BPPHKFE of very poor control but hgbA1ctrending down. Most recent 7.7%.  Insulin drip stopped 3/24. Blood glucose controlled on reduced Lantus and  sliding scale insulin to moderate.   - Diabetes coordinators have seen 10. Anemia: Has been stable. hgb 8.5 today.  11. Deconditioning - Continue to work with CIR 12. Unstageable Pressure Ulcer on buttocks: Much improved - Hydrotherapy completed - Continue repositioning 13. NSVT: No longer on the monitor.  14. Dizziness: Still with  symptoms that seem like BPPV at times. Has had Epley maneuvers.  15. Nausea/vomiting: periodically occurring overnight/in the early am: Continue symptomatic treatment.    Loralie Champagne, MD  11:10 AM

## 2020-11-22 NOTE — Progress Notes (Signed)
Occupational Therapy Session Note  Patient Details  Name: Marcus Johnson. MRN: [id number] Date of Birth: [birth year]  Today's Date: 11/22/2020 OT Individual Time: 1115-1200 OT Individual Time Calculation (min): 45 min    Short Term Goals: Week 2:  OT Short Term Goal 1 (Week 2): Pt will complete toilet transfer with supervision OT Short Term Goal 2 (Week 2): Pt will complete LB dressing with CGA OT Short Term Goal 3 (Week 2): Pt will appropriately instruct a caregiver to change LVAD to battery power  Skilled Therapeutic Interventions/Progress Updates:    Pt received supine resting with no c/o pain. Pt agreeable to OT session. Discussed conference, d/c planning, and need for pt to complete HD in recliner not bed. Pt completed bed mobility to EOB with min A. He was able to maintain sitting balance with supervision after min A for stabilization. He stood from EOB with min A using RW, requiring cueing for hand placement. Mod A stand pivot transfer with worse myclonic jerking in BLE in standing, leading to a sudden and rather unsafe stand > sit. Pt was unable to sequence scooting back in his chair. He stood at the sink with min A to fix hip positioning in chair. Oral care completed with set up assist. Pt was taken to the therapy gym where he completed the [facility]. He scored 23/30 with >26 being normal, and his score being indicative of mild cognitive impairment. Pt returned to his room and was left sitting up in the w/c with - RN alerted to pt remaining on battery pack and how to transfer to HD chair.   Therapy Documentation Precautions:  Precautions Precautions: Fall,Sternal,Other (comment) Precaution Comments: LVAD, sacral wound, Left BPPV Required Braces or Orthoses: Other Brace Other Brace: thumb loop and buddy strap R hand Restrictions Weight Bearing Restrictions: Yes RUE Weight Bearing:  (sternal) Other Position/Activity Restrictions: sternal precautions   Therapy/Group: Individual  Therapy  Curtis Sites 11/22/2020, 11:39 AM

## 2020-11-22 NOTE — Progress Notes (Signed)
Occupational Therapy Weekly Progress Note  Patient Details  Name: Marcus Johnson. MRN: [id number] Date of Birth: April 01, 1961  Beginning of progress report period: November 15, 2020 End of progress report period: November 22, 2020  Today's Date: 11/22/2020 OT Individual Time: 3005-1102 OT Individual Time Calculation (min): 60 min    Patient has met 2 of 3 short term goals. Pt has made good progress despite recent fluctuations in ability. Pt is able to complete LB ADLs at min/mod A level and UB ADLs at supervision. He often fluctuates between requiring as little as CGA for transfers and as much as mod/max A when his myclonic jerking is worse and he has increased difficulty with sequencing.   Patient continues to demonstrate the following deficits: muscle weakness and muscle joint tightness, decreased cardiorespiratoy endurance, decreased awareness, decreased problem solving, decreased safety awareness and delayed processing, central origin and decreased sitting balance, decreased standing balance, decreased postural control and decreased balance strategies and therefore will continue to benefit from skilled OT intervention to enhance overall performance with BADL and Reduce care partner burden.  Patient progressing toward long term goals..  Continue plan of care.  OT Short Term Goals Week 1:  OT Short Term Goal 1 (Week 1): Pt will don pants with min A OT Short Term Goal 1 - Progress (Week 1): Progressing toward goal OT Short Term Goal 2 (Week 1): Pt will tolerate standing at the sink for ADL with no c/o light headness OT Short Term Goal 2 - Progress (Week 1): Met OT Short Term Goal 3 (Week 1): Pt will complete toilet transfer with min A OT Short Term Goal 3 - Progress (Week 1): Met Week 2:  OT Short Term Goal 1 (Week 2): Pt will complete toilet transfer with supervision OT Short Term Goal 2 (Week 2): Pt will complete LB dressing with CGA OT Short Term Goal 3 (Week 2): Pt will appropriately  instruct a caregiver to change LVAD to battery power  Skilled Therapeutic Interventions/Progress Updates:    Pt supine with 3/10 pain in his buttocks, agreeable to OT session. Pt completed bed mobility with mod-max A to come to EOB. He was unable to achieve midline with R LOB, requiring mod to max A. Pt eventually had to be assisted back to supine d/t him coming dangerously close to the EOB. Pt took a supine rest break, reporting dizziness had worsened and was now getting better. He returned to EOB with only CGA and then was able to maintain sitting balance with only supervision. Difficult to discern reasoning for this large of a fluctuation in performance. Pt completed sit > stand initially with only min A. He reported being stable in terms of dizziness and took a step forward before having B knee buckling and requiring total A to return to bed safely. Pt was returned to supine with max A. Practiced LVAD line management from bed level. Built up lines with co-band and pt was still unable to manage either part of the lines except for turning the cap when it was loosened. Pt was left supine with all needs met, bed alarm set.   Therapy Documentation Precautions:  Precautions Precautions: Fall,Sternal,Other (comment) Precaution Comments: LVAD, sacral wound, Left BPPV Required Braces or Orthoses: Other Brace Other Brace: thumb loop and buddy strap R hand Restrictions Weight Bearing Restrictions: Yes RUE Weight Bearing:  (sternal) Other Position/Activity Restrictions: sternal precautions  Therapy/Group: Individual Therapy  Curtis Sites 11/22/2020, 6:25 AM

## 2020-11-22 NOTE — Patient Care Conference (Signed)
Inpatient RehabilitationTeam Conference and Plan of Care Update Date: 11/22/2020   Time: 10:02 AM    Patient Name: Marcus Johnson.      Medical Record Number: [id number]  Date of Birth: Oct 27, 1960 Sex: Male         Room/Bed: 4M08C/4M08C-01 Payor Info: Payor: [employer] MEDICARE / Plan: [place] HMO / Product Type: *No Product type* /    Admit Date/Time:  11/14/2020  3:52 PM  Primary Diagnosis:  [facility] Problems: Principal Problem:   Debility    Expected Discharge Date: Expected Discharge Date: 12/05/20  Team Members Present: Physician leading conference: Dr. Alger Simons Care Coodinator Present: Loralee Pacas, LCSWA;Graciela Plato Creig Hines, RN, BSN, [place] Nurse Present: Dorthula Nettles, RN PT Present: Apolinar Junes, PT OT Present: Laverle Hobby, OT PPS Coordinator present : Gunnar Fusi, SLP     Current Status/Progress Goal Weekly Team Focus  Bowel/Bladder   Patient is oliguric, last BM 4/18, continent of B & B  remain continent  assess for changes   Swallow/Nutrition/ Hydration             ADL's   Supervision UB ADLs, min A LB ADLs. Still requires max A for LVAD line management, able to instruct with min cueing  supervision overall  LVAD management, dizziness management, ADL, transfers, activity tolerance   Mobility   CGA-min A overall, decline in function the last couple of days with intermittent max A limited by dizziness/lightheadedness  Supervision-CGA overall  Activity tolerance, balance, functional mobility, gait and stair training as tolerated, LVAD education,   Communication             Safety/Cognition/ Behavioral Observations            Pain   c/o mild pain to the right elbow & buttocks, has muscle rub & neurontin scheduled, oxycodone & tylenol are prn  pain <3/10- pain scale a 1 tonight  assess & treat as needed   Skin   has 2 stage 3 to the coccyx & right buttock area as per WOC on 4/11, wet to moist drsg BID  no new skin break  down, no signs of infection to existing wounds  assess q shift     Discharge Planning:  Pt to d/c to home with his son and brother. His brother [name] will be his primary caregiver since his son works 3rd shift. Pt is new dialysis and LVAD.   Team Discussion: Patient reports being lightheaded, dizzy, BP was elevated today, was nauseated today. Patient is anemic, CBG's are fair, has a cough at night and early morning. Nursing reports that he is continent of B/B, has a 3/10 pain this morning. He has a stage 3 to his sacrum that is being treated appropriately. Nursing is educating on Carb mod diet, insulin use and injections, wound care and dressing changes, and LVAD. Social work reports he has 26 steps to get to his apartment and his brother intends on assisting him with the stairs. His son works 3rd shift but will also be able to assist. He will need to be able to sit in a recliner for outpatient hemodialysis. His son and brother are comfortable with the LVAD. Patient on target to meet rehab goals: yes, patient has memory deficits, almost has a fall when the patient's knees buckled. The patient has very poor awareness of these deficits. The son and brother are very comfortable with changing the batteries for the LVAD and the overall management of the LVAD. Last week he was  able to walk over 150 ft, this week he hasn't been able to walk at all due to his symptoms.   *See Care Plan and progress notes for long and short-term goals.   Revisions to Treatment Plan:  Continue to work with Cardiology to manage his symptoms.  Teaching Needs: Family education, medication management, pain management, diabetes management, skin/wound care, dressing changes, LVAD training, transfer training, gait training, balance training, endurance training, stair training, safety awareness, sitting endurance.  Current Barriers to Discharge: Decreased caregiver support, Medical stability, Home enviroment access/layout, Wound  care, Lack of/limited family support, Weight, Hemodialysis, Weight bearing restrictions, Medication compliance and Behavior  Possible Resolutions to Barriers: Continue current medications, monitor lab values, monitor BP/dizziness, provide emotional support.     Medical Summary Current Status: still with dizziness/lightheadedness. am nausea. vestibular work up negative/inconclusive, ongoing wound care issues  Barriers to Discharge: Medical stability   Possible Resolutions to [employer]: ongoing bp/dizziness assessment and med/volume adjustments   Continued Need for Acute Rehabilitation Level of Care: The patient requires daily medical management by a physician with specialized training in physical medicine and rehabilitation for the following reasons: Direction of a multidisciplinary physical rehabilitation program to maximize functional independence : Yes Medical management of patient stability for increased activity during participation in an intensive rehabilitation regime.: Yes Analysis of laboratory values and/or radiology reports with any subsequent need for medication adjustment and/or medical intervention. : Yes   I attest that I was present, lead the team conference, and concur with the assessment and plan of the team.   Cristi Loron 11/22/2020, 11:55 AM

## 2020-11-22 NOTE — Progress Notes (Signed)
Patient again began vomiting. He was given prn order of compazine 5mg  IM. Reglan held at this time until he can tolerate po meds.

## 2020-11-22 NOTE — Progress Notes (Signed)
Marland Kitchen  [facility] Progress Note     Assessment/ Plan:   Acute kidney injury on chronic kidney disease stage III: With underlying chronic kidney disease likely from diabetes/hypertension and worsening renal function from hemodynamically mediated ATN and cardiorenal syndrome. CRRT 3/15-3/22, 3/24-27; HD since 3/28. -Unfortunately, no signs of renal recovery currently - s/p [facility] 11/07/20 with IR, appreciate assistance - HD TTS schedule now; tolerating well, dialysis today   - Outpt CLIP underway--> renal navigator following;  -Previous provider d/w the patient and his son referring for permanent access placement. Also discuss with Dr. Aundra Dubin and Dr. Donzetta Matters whether in the subacute setting with a LVAD (placed 3/10) whether the timing is reasonable. Eventually would like to have a permanent acces in the arm given the risk of infection with a tunneled catheter. Would prefer graft unless vein caliber is excellent   Acute exacerbation of chronic systolic heart failure with cardiogenic shock: LVAD, placed 3/10.  Volume status/UF managed with RRT.   Per above UF to euvolemia.  midodrine and warfarin, off Revatio d/t orthostasis   Anemia: Stable. Likely secondary to chronic disease with CKD contributing.  Transfuse PRBC as needed.  Iron deficient, has been repleted IV.  Continue Aranesp 100 MCG weekly on Tuesday  Renal osteodystrophy: Phosphorus elevated 6.86 today. Considering phosphate binder.    Atrial fibrillation: per cardiology, on amiodarone   Protein calorie malnutrition, hypoalbuminemia: push protein, per primary   DM II: per primary  Dispo: patient able to do HD in chair; working on setting up Outpatient dialysis  Subjective:   Patient reports being nauseous. States he vomited 3 times this morning. Reports RN has been giving him something for nausea. Rates current pain 3/10.    Objective:   BP 100/72 (BP Location: Left Arm)   Pulse 74   Temp 97.7 F (36.5 C) (Oral)   Resp  20   Ht 5' 10.5" (1.791 m)   Wt 85.5 kg   SpO2 91%   BMI 26.68 kg/m   Intake/Output Summary (Last 24 hours) at 11/22/2020 1014 Last data filed at 11/22/2020 0734 Gross per 24 hour  Intake 480 ml  Output --  Net 480 ml   Weight change: 1.452 kg  Physical Exam:  GEN: pleasant elderly male laying in bed, in no acute distress  CV: regular rate, mechanical hum, RUE dialysis catheter  RESP: no increased work of breathing, clear lungs bilaterally  ABD: soft, non-tender, bowel sounds present, non-distended  MSK: no Le edema SKIN: warm, dry   Imaging: DG Abd Portable 1V  Result Date: 11/20/2020 CLINICAL DATA:  Nausea and vomiting EXAM: PORTABLE ABDOMEN - 1 VIEW COMPARISON:  10/17/2020 FINDINGS: Changes consistent with LVAD are again seen and stable. Feeding catheter has been removed in the interval. Defibrillator is noted. Scattered large and small bowel gas is noted. Retained fecal material is seen consistent with a degree of mild constipation. No obstructive changes are noted. IMPRESSION: Changes of mild constipation.  No obstruction is seen. Electronically Signed   By: Inez Catalina M.D.   On: 11/20/2020 17:20    Labs: BMET Recent Labs  Lab 11/16/20 0307 11/17/20 0411 11/18/20 0428 11/19/20 0435 11/20/20 0418 11/21/20 0508 11/22/20 0449  NA 133* 135 134* 137 135 135 134*  K 4.4 4.6 4.0 4.5 3.9 4.0 4.9  CL 96* 97* 97* 97* 96* 95* 95*  CO2 27 27 30 27 30 28 27   GLUCOSE 111* 115* 91 119* 86 79 96  BUN 34* 50* 29* 48*  29* 43* 60*  CREATININE 4.63* 6.32* 4.35* 5.88* 4.08* 5.52* 6.86*  CALCIUM 8.9 9.0 9.1 9.4 9.0 9.3 9.0  PHOS 5.3* 6.5* 4.4 5.6* 4.2 5.7* 6.8*   CBC Recent Labs  Lab 11/18/20 0428 11/19/20 0435 11/21/20 0508 11/22/20 0449  WBC 8.0 9.5 9.3 9.5  HGB 8.4* 8.4* 8.6* 8.5*  HCT 27.9* 27.9* 28.3* 27.6*  MCV 89.7 89.7 89.3 89.0  PLT 202 202 190 174    Medications:    . (feeding supplement) PROSource Plus  30 mL Oral BID BM  . amiodarone  200 mg Oral Daily   . vitamin C  1,000 mg Oral Daily  . Chlorhexidine Gluconate Cloth  6 each Topical Q0600  . darbepoetin (ARANESP) injection - DIALYSIS  100 mcg Intravenous Q Tue-HD  . feeding supplement (NEPRO CARB STEADY)  237 mL Oral BID BM  . gabapentin  100 mg Oral QHS  . guaiFENesin  600 mg Oral BID  . insulin aspart  0-15 Units Subcutaneous TID WC  . insulin aspart  0-5 Units Subcutaneous QHS  . insulin glargine  14 Units Subcutaneous Daily  . metoCLOPramide  5 mg Oral Q8H  . midodrine  5 mg Oral TID WC  . multivitamin  1 tablet Oral QHS  . Muscle Rub   Topical BID  . pantoprazole  40 mg Oral Daily  . polyethylene glycol  17 g Oral BID  . rosuvastatin  20 mg Oral Daily  . scopolamine  1 patch Transdermal Q72H  . senna-docusate  2 tablet Oral BID  . sorbitol  30 mL Oral Daily  . Warfarin - Pharmacist Dosing Inpatient   Does not apply [place], DO  11/22/2020, 10:14 AM

## 2020-11-22 NOTE — Progress Notes (Signed)
Patient noted in bed at the beginning of the shift. LVAD is connected to wall power.No signs of acute distress noted. All LVAD checks were within normal limits. He took his medications without difficulty & tolerated it well. Coccyx & right coccyx wounds were dressed as ordered. Measurements taken. This morning just prior to 4am, patient started vomitting yellowish emesis, small amount. Offered compazine, he declined saying the other day it made it worse. He states that every night at around this time it happens & thinks that the Reglan is not working for him. He did ask for oxycodone since when staff pulled him up, his buttocks rubbed against the air mattress but when asked if he thought he could keep it down, he declined. "We'll wait a little while", he stated. The other night he was also given zofran. He was cleaned up & turned to his left side. Call bell is within reach. He seemed to be going to sleep. Will continue to monitor.

## 2020-11-22 NOTE — Progress Notes (Signed)
LVAD Coordinator Rounding Note:  HM III LVAD implanted on 10/13/20 by Dr Cyndia Bent under Destination Therapy criteria.  Discharged from [facility] and admitted to [facility] 11/14/20.  Pt sitting up in wheelchair awake, pleasant, very talkative. States he "worked hard" in therapy this morning. Reports he was nauseated and vomited x 3 this morning. Nausea now resolved.   No family at bedside for dressing change education.   Pt has HD dialysis scheduled on Tues/Thurs/Sat.   Vital signs: Temp: 98.5 HR: 83 Doppler: 97 Auto cuff: 109/76 (86) O2 Sat: 95% on RA Wt: 183.6>190.9>194>185.4>188.6 lbs    LVAD interrogation reveals:  Speed: 5200 Flow: 4.2 Power: 3.8w PI: 4.2 Hematocrit: 28  Alarms: none Events: 4 PI today  Fixed speed: 5200 Low speed limit: 4900  Drive Line:  Existing VAD dressing clean, dry, intact. Drive line anchor correctly applied. Will advance to twice weekly while using acell. Next dressing change will be due 11/24/20. No family available for dressing change.   Labs:  LDH trend: 199>191>196>180>174>168  INR trend: 1.5>1.7>1.9>2.4>3.2  Anticoagulation Plan: -INR Goal: 2.0-2.5 -ASA Dose: stopped 30 days post op per protocol  Device:  -  [employer]tr[employer] single -  Pacing: VVI 40 - Therapies: on 200 bpm  Plan/Recommendations:  1. Call VAD coordinator for equipment or drive line issues 2. Twice weekly dressing changes while using acell. Next dressing due 11/24/20.   Emerson Monte RN [place] Coordinator  Office: [phone]  24/7 Pager: [phone]

## 2020-11-22 NOTE — Progress Notes (Signed)
Physical Therapy Weekly Progress Note  Patient Details  Name: Marcus Johnson. MRN: [id number] Date of Birth: Jul 16, 1961  Beginning of progress report period: November 15, 2020 End of progress report period: November 22, 2020  Today's Date: 11/22/2020 PT Individual Time: 0910-0925 PT Individual Time Calculation (min): 15 min   Patient has met 2 of 5 short term goals.  Patient with slow progress initially then decline in function and activity tolerance due to symptoms of "lightheadedness," "dizzyness," and nausea/vomitying with and following mobility. Has performed bed mobility and transfers with min A-CGA using RW, and ambulated >100 ft using RW with CGA, however has not ambulated in 4 days due to symptoms in standing and has had fluctuating mobility, requiring up to max A for bed mobility and standing due to symptom onset. Intitiated vestibular evaluations and treatment without consistent presentations of symptom onset with testing or response to treatment for BPPV with Epley maneuvers.   Patient continues to demonstrate the following deficits muscle weakness and muscle joint tightness, decreased cardiorespiratoy endurance, abnormal tone and decreased coordination and decreased standing balance, decreased postural control and decreased balance strategies and therefore will continue to benefit from skilled PT intervention to increase functional independence with mobility.  Patient not progressing toward long term goals.  See goal revision..  Plan of care revisions: Downgraded gait and stair goals, discussing dialysis transport with CSW..  PT Short Term Goals Week 1:  PT Short Term Goal 1 (Week 1): Patient will perform bed mobility with min A consistently. PT Short Term Goal 1 - Progress (Week 1): Met PT Short Term Goal 2 (Week 1): Patient will perform basic transfers with CGA using LRAD. PT Short Term Goal 2 - Progress (Week 1): Progressing toward goal PT Short Term Goal 3 (Week 1): Patient  will ambulate >50 ft without symptoms using LRAD. PT Short Term Goal 3 - Progress (Week 1): Partly met (met x1, but not consistent) PT Short Term Goal 4 (Week 1): Patient will tolerate sitting OOB >1 hour. PT Short Term Goal 4 - Progress (Week 1): Met PT Short Term Goal 5 (Week 1): Patient will initate stair training. PT Short Term Goal 5 - Progress (Week 1): Progressing toward goal Week 2:  PT Short Term Goal 1 (Week 2): Patient will perform basic transfers with CGA using LRAD consistently. PT Short Term Goal 2 (Week 2): Patient will consistently ambulate >50 ft using LRAD with CGA. PT Short Term Goal 3 (Week 2): Patient will initiate stair training.  Skilled Therapeutic Interventions/Progress Updates:     Patient in bed upon PT arrival. Patient alert and agreeable to PT session. Patient denied pain during session, however reported feeling "lightheaded" while lying in the bed.   Obtained BP via Doppler with LPN assist, MAP 384. Discussed with MD and decided to hold mobility until MAP reduced.   Discussed if patient is having anxiety with mobility due to symptoms, he reports that he "may have some." Discussed stress management strategies using diaphragmatic breathing and patient practiced diaphragmatic breathing x10 breaths.   Will attempt orthostatic MAP readings at a later time when patient has rested and lying BP is reduced. LPN and charge nurse made aware.  Patient in bed with LVAD attached to the monitor at end of session with breaks and all needs within reach.   Returned at 1030 to complete session and reasses patient's vitals. Per LPN, Nephrology would like patient to stay in the bed at this time to improve patient's tolerance with sitting  in the dialysis recliner this afternoon. Discussed with OT to obtain orthostatic vitals and assist with transfer during OT session scheduled just before 1200.   Patient missed 45 min of skilled PT due to elevated MAP, RN made aware. Will attempt to  make-up missed time as able.     Therapy Documentation Precautions:  Precautions Precautions: Fall,Sternal,Other (comment) Precaution Comments: LVAD, sacral wound, Left BPPV Required Braces or Orthoses: Other Brace Other Brace: thumb loop and buddy strap R hand Restrictions Weight Bearing Restrictions: Yes RUE Weight Bearing:  (sternal) Other Position/Activity Restrictions: sternal precautions   Therapy/Group: Individual Therapy  Eular Panek L Janelys Glassner PT, DPT  11/22/2020, 11:53 AM

## 2020-11-23 DIAGNOSIS — I5022 Chronic systolic (congestive) heart failure: Secondary | ICD-10-CM | POA: Diagnosis not present

## 2020-11-23 DIAGNOSIS — Z95811 Presence of heart assist device: Secondary | ICD-10-CM | POA: Diagnosis not present

## 2020-11-23 DIAGNOSIS — I951 Orthostatic hypotension: Secondary | ICD-10-CM | POA: Diagnosis not present

## 2020-11-23 DIAGNOSIS — R5381 Other malaise: Secondary | ICD-10-CM | POA: Diagnosis not present

## 2020-11-23 LAB — RENAL FUNCTION PANEL
Albumin: 2.7 g/dL — ABNORMAL LOW (ref 3.5–5.0)
Anion gap: 13 (ref 5–15)
BUN: 38 mg/dL — ABNORMAL HIGH (ref 6–20)
CO2: 27 mmol/L (ref 22–32)
Calcium: 9.3 mg/dL (ref 8.9–10.3)
Chloride: 96 mmol/L — ABNORMAL LOW (ref 98–111)
Creatinine, Ser: 5.14 mg/dL — ABNORMAL HIGH (ref 0.61–1.24)
GFR, Estimated: 12 mL/min — ABNORMAL LOW (ref >60)
Glucose, Bld: 75 mg/dL (ref 70–99)
Phosphorus: 5.2 mg/dL — ABNORMAL HIGH (ref 2.5–4.6)
Potassium: 4.4 mmol/L (ref 3.5–5.1)
Sodium: 136 mmol/L (ref 135–145)

## 2020-11-23 LAB — CBC
HCT: 28.8 % — ABNORMAL LOW (ref 39.0–52.0)
Hemoglobin: 8.7 g/dL — ABNORMAL LOW (ref 13.0–17.0)
MCH: 27.1 pg (ref 26.0–34.0)
MCHC: 30.2 g/dL (ref 30.0–36.0)
MCV: 89.7 fL (ref 80.0–100.0)
Platelets: 151 10*3/uL (ref 150–400)
RBC: 3.21 MIL/uL — ABNORMAL LOW (ref 4.22–5.81)
RDW: 17 % — ABNORMAL HIGH (ref 11.5–15.5)
WBC: 8.9 10*3/uL (ref 4.0–10.5)
nRBC: 0 % (ref 0.0–0.2)

## 2020-11-23 LAB — MAGNESIUM: Magnesium: 2.1 mg/dL (ref 1.7–2.4)

## 2020-11-23 LAB — GLUCOSE, CAPILLARY
Glucose-Capillary: 74 mg/dL (ref 70–99)
Glucose-Capillary: 103 mg/dL — ABNORMAL HIGH (ref 70–99)
Glucose-Capillary: 152 mg/dL — ABNORMAL HIGH (ref 70–99)
Glucose-Capillary: 108 mg/dL — ABNORMAL HIGH (ref 70–99)

## 2020-11-23 LAB — PROTIME-INR
INR: 2.9 — ABNORMAL HIGH (ref 0.8–1.2)
Prothrombin Time: 30.4 seconds — ABNORMAL HIGH (ref 11.4–15.2)

## 2020-11-23 LAB — LACTATE DEHYDROGENASE: LDH: 182 U/L (ref 98–192)

## 2020-11-23 MED ORDER — WARFARIN SODIUM 1 MG PO TABS
1.0000 mg | ORAL_TABLET | Freq: Once | ORAL | Status: AC
  Administered 2020-11-23: 1 mg via ORAL
  Filled 2020-11-23: qty 1

## 2020-11-23 MED ORDER — CHLORHEXIDINE GLUCONATE CLOTH 2 % EX PADS
6.0000 | MEDICATED_PAD | Freq: Every day | CUTANEOUS | Status: DC
  Administered 2020-11-23 – 2020-11-25 (×3): 6 via TOPICAL

## 2020-11-23 NOTE — Progress Notes (Addendum)
Marland Kitchen  [facility] Progress Note     Assessment/ Plan:   Acute kidney injury on chronic kidney disease stage III: With underlying chronic kidney disease likely from diabetes/hypertension and worsening renal function from hemodynamically mediated ATN and cardiorenal syndrome. CRRT 3/15-3/22, 3/24-27; HD since 3/28. -Unfortunately, no signs of renal recovery currently - s/p [facility] 11/07/20 with IR, appreciate assistance - HD TTS schedule now; tolerating well, dialysis last on 4/19, plan for HD tomorrow  - Outpt CLIP underway--> renal navigator following;  -Previous provider d/w the patient and his son referring for permanent access placement. Also discuss with Dr. Aundra Dubin and Dr. Donzetta Matters whether in the subacute setting with a LVAD (placed 3/10) whether the timing is reasonable. Eventually would like to have a permanent acces in the arm given the risk of infection with a tunneled catheter. Would prefer graft unless vein caliber is excellent   Acute exacerbation of chronic systolic heart failure with cardiogenic shock: LVAD, placed 3/10.  Volume status/UF managed with RRT.   Per above UF to euvolemia.  midodrine and warfarin, off Revatio d/t orthostasis   Anemia: Stable. Likely secondary to chronic disease with CKD contributing.  Transfuse PRBC as needed.  Iron deficient, has been repleted IV.  Continue Aranesp 100 MCG weekly on Tuesday  Renal osteodystrophy:  Phosphorous 5.2   Atrial fibrillation: per cardiology, on amiodarone   Protein calorie malnutrition, hypoalbuminemia: push protein, per primary   DM II: per primary  Dispo: patient able to do HD in chair; working on setting up Outpatient dialysis  Subjective:   No nausea this morning. Reports no pain. No concerns about HD session yesterday.    Objective:   BP (!) 129/99 (BP Location: Left Arm)   Pulse 84   Temp 97.8 F (36.6 C)   Resp 20   Ht 5' 10.5" (1.791 m)   Wt 83.6 kg   SpO2 99%   BMI 26.06 kg/m   Intake/Output  Summary (Last 24 hours) at 11/23/2020 5188 Last data filed at 11/23/2020 0719 Gross per 24 hour  Intake 180 ml  Output 2100 ml  Net -1920 ml   Weight change: -1.996 kg  Physical Exam:  GEN: sitting upright in chair in no acute distress  CV: mechanical hum from LVAD, regular rate, RUE dialysis catheter  RESP: no increased work of breathing, clear to ascultation bilaterally with no crackles, wheezes, or rhonchi  ABD: Bowel sounds present. Soft, Nontender, Nondistended. MSK: no Le edema,  SKIN: warm, dry   Imaging: No results found.  Labs: BMET Recent Labs  Lab 11/17/20 0411 11/18/20 0428 11/19/20 0435 11/20/20 0418 11/21/20 0508 11/22/20 0449 11/23/20 0400  NA 135 134* 137 135 135 134* 136  K 4.6 4.0 4.5 3.9 4.0 4.9 4.4  CL 97* 97* 97* 96* 95* 95* 96*  CO2 27 30 27 30 28 27 27   GLUCOSE 115* 91 119* 86 79 96 75  BUN 50* 29* 48* 29* 43* 60* 38*  CREATININE 6.32* 4.35* 5.88* 4.08* 5.52* 6.86* 5.14*  CALCIUM 9.0 9.1 9.4 9.0 9.3 9.0 9.3  PHOS 6.5* 4.4 5.6* 4.2 5.7* 6.8* 5.2*   CBC Recent Labs  Lab 11/19/20 0435 11/21/20 0508 11/22/20 0449 11/23/20 0400  WBC 9.5 9.3 9.5 8.9  HGB 8.4* 8.6* 8.5* 8.7*  HCT 27.9* 28.3* 27.6* 28.8*  MCV 89.7 89.3 89.0 89.7  PLT 202 190 174 151    Medications:    . (feeding supplement) PROSource Plus  30 mL Oral BID BM  .  amiodarone  200 mg Oral Daily  . vitamin C  1,000 mg Oral Daily  . Chlorhexidine Gluconate Cloth  6 each Topical Q0600  . Chlorhexidine Gluconate Cloth  6 each Topical Q0600  . darbepoetin (ARANESP) injection - DIALYSIS  100 mcg Intravenous Q Tue-HD  . feeding supplement (NEPRO CARB STEADY)  237 mL Oral BID BM  . gabapentin  100 mg Oral QHS  . guaiFENesin  600 mg Oral BID  . insulin aspart  0-15 Units Subcutaneous TID WC  . insulin aspart  0-5 Units Subcutaneous QHS  . insulin glargine  14 Units Subcutaneous Daily  . metoCLOPramide  5 mg Oral Q8H  . midodrine  5 mg Oral TID WC  . multivitamin  1 tablet Oral  QHS  . Muscle Rub   Topical BID  . pantoprazole  40 mg Oral Daily  . polyethylene glycol  17 g Oral BID  . rosuvastatin  20 mg Oral Daily  . scopolamine  1 patch Transdermal Q72H  . senna-docusate  2 tablet Oral BID  . sorbitol  30 mL Oral Daily  . Warfarin - Pharmacist Dosing Inpatient   Does not apply [place], DO  11/23/2020, 9:37 AM

## 2020-11-23 NOTE — Progress Notes (Signed)
Physical Therapy Session Note  Patient Details  Name: Marcus Johnson. MRN: [id number] Date of Birth: Apr 04, 1961  Today's Date: 11/23/2020 PT Individual Time: 1000-1100 and 1436-1530 PT Individual Time Calculation (min): 60 min and 54 min   Short Term Goals: Week 2:  PT Short Term Goal 1 (Week 2): Patient will perform basic transfers with CGA using LRAD consistently. PT Short Term Goal 2 (Week 2): Patient will consistently ambulate >50 ft using LRAD with CGA. PT Short Term Goal 3 (Week 2): Patient will initiate stair training.  Skilled Therapeutic Interventions/Progress Updates:     Session 1: Patient in the w/c in the room upon PT arrival. Patient alert and agreeable to PT session. Patient reported 3/10 sacral pain during session. PT provided repositioning, rest breaks, and distraction as pain interventions throughout session.   Therapeutic Activity: Bed Mobility: Patient performed sit to supine with supervision-mod I for safety and increased time. Transfers: Patient performed sit to/from stand x4 and stand pivot x1 with min A progressing to CGA using RW. Patient able to teach back hand placement on RW, and reaching back to sit, required hand-over-hand assist to locate arm rest to control descent.  Gait Training:  Patient ascended/descended 2-6" steps, then 4-6" steps using B rails with min A-CGA +2 for safety due to "woozyness" with mobility. Performed step-to gait pattern leading with R while ascending and L while descending. Provided cues for technique and sequencing. Patient reported "woozyness" at the end of both trials requiring prolonged seated rest breaks after with symptoms resolving. Discussed home entry, 5 + 3x7 steps to access 3rd floor apartment with L rail on first 5 steps and wide B rails on all other steps, and PT goals and safety concerns during rest breaks. Patient stated that he and his family are looking for a first floor apartment at this time.   Patient  ambulated 96 feet and 78 feet using RW with CGA and w/c follow for safety due to decreased activity tolerance and "woozyness". Ambulated with decreased gait speed, step height, step length, trunk rotation, and visual scanning. Provided verbal cues for scanning the environment, increased step height and length, and paced breathing. Distance limited by onset of "woozyness" during both trials. Performed Doppler BP after last trial, MAP 98, RN made aware and with reassess patient after patient has time to rest in the bed.   Wheelchair Mobility:  Patient was transported in the w/c with total A throughout session for energy conservation and time management.  Patient in R side-lying in the bed for pressure relief and BP management at end of session with LVAD attached to monitor, all batteries charging, breaks locked, 4 rails up for safety per patient request, and all needs within reach.   Session 2: Patient sitting EOB  upon PT arrival. Patient alert and agreeable to PT session. Patient reported 7/10 sacral pain during session, RN made aware and provided pain meds during session. PT provided repositioning, rest breaks, and distraction as pain interventions throughout session. Also, re-inflated Roho cushion for improved sitting tolerance in w/c during session. Switched patient to the portable batteries with total A, as patient declined attempting at this time due to wanting to focus on mobility activities.   Therapeutic Activity: Bed Mobility: Patient performed sit to supine with supervision-mod I for safety and increased time. Transfers: Patient performed sit to/from stand pivot with RW x1 and sit to/from stand x5 without AD with min A-CGA. Provided verbal cues for forward weight shift and reaching back to  sit with intermittent hand over hand for finding arm rest.  Wheelchair Mobility:  Patient was transported in the w/c with total A throughout session for energy conservation and time  management.  Neuromuscular Re-ed: Patient performed the following activities: -sit to/from stand x4 without AD for improved balance with transfers with CGA -standing balance while bowling with plastic pins and rubber bowling ball x5, using L hand to grasp and throw the ball with min A for balance -ambulated 10 feet x2 without AD with min A-CGA to work on balance with functional mobility; ambulated as above, provided verbal cues for weight shift for improved balance on stance limb and increased step height for safety.  Patient in R side-lying in the bed for pressure relief and BP management at end of session with LVAD attached to monitor, all batteries charging, breaks locked, 4 rails up for safety per patient request, and all needs within reach.    Therapy Documentation Precautions:  Precautions Precautions: Fall,Sternal,Other (comment) Precaution Comments: LVAD, sacral wound, Left BPPV Required Braces or Orthoses: Other Brace Other Brace: thumb loop and buddy strap R hand Restrictions Weight Bearing Restrictions: Yes RUE Weight Bearing:  (sternal) Other Position/Activity Restrictions: sternal precautions   Therapy/Group: Individual Therapy  Marcus Johnson PT, DPT  11/23/2020, 4:54 PM

## 2020-11-23 NOTE — Consult Note (Signed)
Neuropsychological Consultation   Patient:   Marcus Johnson.   DOB:   [birth year]  MR Number:  [id number]  Location:  [facility] [address] [facility] [place] [id number] [place] [id number] Dept: [place]: [phone]           Date of Service:   11/23/2020  Start Time:   2 PM End Time:   3 PM  Provider/Observer:  Ilean Skill, Psy.D.       Clinical Neuropsychologist       Billing Code/Service: [id number]  Chief Complaint:    Neizan Debruhl. is a 60 year old male with history of type 2 diabetes with retinopathy, CABG, ICM chronic systolic congestive heart failure/ICD, chronic kidney disease, left foot wound, COVID-1 08/2020.  Patient was admitted on 09/28/20 with acute on chronic congestive heart failure as well as episodes of A. fib/a flutter and developed hypertensive requiring transfer to ICU.  Patient has been resistant to LVAD/transplant the patient was ultimately agreeable to undergo LVAD placement on 10/13/2020.  Patient developed acute on chronic renal failure requiring hemodialysis.  Patient had issues with hypotensive and presyncopal events with jerking episodes and visual deficits on 4/3 and CT head and that was negative for acute changes.  Patient continuing to describe numbness in his right arm postsurgery as well.  Due to limited mobility/debility with functional deficits patient was admitted to CIR due to functional decline.  Reason for Service:  Patient was referred for neuropsychological consultation due to coping and adjustment with new cardiovascular status and extended hospital stay post LVAD.  Below see HPI for the current admission.  PJK:DTOIZT L. Tisdale is a 60 year old male with history of T2DM with retinopathy, CABG, ICM with EF 24-58%/ chronic systolic CHF/ICD, CKD, left foot wound treated with IV antibiotics 07/2020, Covid 19 infection 08/2020; who was admitted on 09/28/20 with  acute on chronic CHF as well as episodes of Afib/A flutter per ICD interrogation. Patient had declined LVAD or transplant in the past. He was started on milrinone for diuresis and developed hypotension requiring transfer to ICU as dobutamine/norepinephrine. Impella placed due to RV failure with development of RUE neuropathy pain due stretch injury, teeth with caries removed and he was agreeable to undergo LVAD placement on 10/13/20 by Dr. Cyndia Bent. He developed acute on chronic renal failure and required CRRT without recovery and now on HD likely due to progression to ESRD.   Sacral decub being monitored by [place] with wet to dry dressings-->santyl but continued to worsen and hydrotherapy started on 11/02/20. tunneled catheter placed by Dr. Laurence Ferrari on 04/01. He has had issues with hypotension,presyncope with jerking episode and visual deficits on 04/06 and CT head done which was negative for acute changes. CXR showed LLL atelectasis v/s infiltrate treated with pulmonary hygiene. Medical issues optimized and therapy ongoing and reports improvement in mentation as well as activity tolerance. He continues to be limited by debility with functional deficits impacting ADLs and mobility. CIR recommended due to functional decline.  Current Status:  Patient was awake and alert sitting up on the edge of his bed as I entered the room.  Patient was oriented and aware of what had transpired with various medical interventions and his LVAD.  Patient talked about his prior reluctance for LVAD/transplant list placement as he was hoping he would improve without there is more dramatic intervention.  Patient realized that he was progressively failing from a cardiovascular status and family agreed  to LVAD placement.  Patient reports that he is feeling stronger and more stable.  He was able to walk up 1 flight of steps today which she was quite pleased about.  However, his home and has multiple family members living and is 3 flights  of steps to get into the front door.  The patient has not been able to accomplish that number of flights of stairs yet but they are actively working on increasing his status.  The patient has checked on the first floor unit but the units on the first and second floor have many fewer bedrooms then he and his family required.  Behavioral Observation: Erique Kaser.  presents as a 60 y.o.-year-old Right African American Male who appeared his stated age. his dress was Appropriate and he was Well Groomed and his manners were Appropriate to the situation.  his participation was indicative of Appropriate behaviors.  There were physical disabilities noted.  he displayed an appropriate level of cooperation and motivation.     Interactions:    Active Appropriate and Redirectable  Attention:   abnormal and attention span appeared shorter than expected for age  Memory:   within normal limits; recent and remote memory intact  Visuo-spatial:  not examined  Speech (Volume):  low  Speech:   normal; normal  Thought Process:  Coherent and Relevant  Though Content:  WNL; not suicidal and not homicidal  Orientation:   person, place, time/date and situation  Judgment:   Fair  Planning:   Fair  Affect:    Appropriate  Mood:    Anxious  Insight:   Good  Intelligence:   normal  Medical History:   Past Medical History:  Diagnosis Date  . AICD (automatic cardioverter/defibrillator) present    Single-chamber  implantable cardiac defibrillator - Medtronic  . Atrial fibrillation ([place])   . Cataract    Mixed form OU  . CHF (congestive heart failure) ([place])   . Chronic kidney disease   . Chronic kidney disease (CKD), stage III (moderate) (HCC)   . Chronic systolic heart failure ([place])    a. Echo 5/13: Mild LVE, mild LVH, EF 10%, anteroseptal, lateral, apical AK, mild MR, mild LAE, moderately reduced RVSF, mild RAE, PASP 60;  b. 07/2014 Echo: EF 20-2%, diff HK, AKI of  antsep/apical/mid-apicalinferior, mod reduced RV.  Marland Kitchen Coronary artery disease    a. s/p CABG 2002 b. LHC 5/13:  dLM 80%, LAD subtotally occluded, pCFX occluded, pRCA 50%, mid? Occlusion with high take off of the PDA with 70% multiple lesions-not bypassed and supplies collaterals to LAD, LIMA-IM/ramus ok, S-OM ok, S-PLA branches ok. Medical therapy was recommended  . COVID   . Diabetic retinopathy (HCC)    NPDR OD, PDR OS  . Dyspnea   . Gout    "on daily RX" (01/08/2018)  . Hypertension   . Hypertensive retinopathy    OU  . Ischemic cardiomyopathy    a. Echo 5/13: Mild LVE, mild LVH, EF 10%, anteroseptal, lateral, apical AK, mild MR, mild LAE, moderately reduced RVSF, mild RAE, PASP 60;  b. 01/2012 s/p MDT [id number] Protecta XT VR AICD;  c. 07/2014 Echo: EF 20-2%, diff HK, AKI of antsep/apical/mid-apicalinferior, mod reduced RV.  Marland Kitchen MRSA (methicillin resistant Staphylococcus aureus)    Status post right foot plantar deep infection with MRSA status post  I&D 10/2008  . Myocardial infarction [facility])    "was told I'd had several before heart OR in 2002" (01/08/2018)  . Noncompliance   .  Peripheral neuropathy   . Retinopathy, diabetic, background ([place])   . Syncope   . Type II diabetes mellitus ([place])    requiring insulin   . Vitreous hemorrhage, left (HCC)    and proliferative diabetic retinopathy         Patient Active Problem List   Diagnosis Date Noted  . Debility 11/14/2020  . Pressure injury of skin 10/16/2020  . LVAD (left ventricular assist device) present ([place]) 10/13/2020  . Dental caries   . Loss of teeth due to extraction   . Acute on chronic systolic heart failure ([place]) 09/28/2020  . Osteomyelitis of metatarsal ([place]) 08/11/2018  . Systolic CHF ([place]) 16/02/3709  . Acute on chronic systolic (congestive) heart failure ([place]) 01/07/2018  . CKD (chronic kidney disease), stage III ([place]) 12/20/2017  . Chronic combined systolic and diastolic congestive heart failure ([place]) 04/16/2016  .  Generalized weakness 04/15/2016  . Acute renal failure superimposed on stage 3 chronic kidney disease ([place]) 04/15/2016  . Dehydration   . Non compliance with medical treatment   . Obesity (BMI 30-39.9) 07/21/2014  . Cardiomyopathy, ischemic 07/21/2014  . Coronary artery disease involving native coronary artery of native heart without angina pectoris   . Lower extremity edema 07/19/2014  . DM (diabetes mellitus) type II controlled with renal manifestation ([place]) 07/19/2014  . CAP (community acquired pneumonia) 10/21/2013  . Dyspnea 10/19/2013  . Automatic implantable cardioverter-defibrillator  Medtronic 01/08/2012  . PLANTAR FASCIITIS 10/08/2008  . CARBUNCLE AND FURUNCLE OF TRUNK 08/09/2008  . HLD (hyperlipidemia) 01/09/2008  . HTN (hypertension) 01/09/2008  . MYOCARDIAL INFARCTION, HX OF 01/09/2008  . Ischemic cardiomyopathy 01/09/2008      Psychiatric History:  No prior psychiatric history  Family Med/Psych History:  Family History  Problem Relation Age of Onset  . Diabetes Father        died in his 63's  . Hypertension Mother        died in her 52's - had a ppm.  . Amblyopia Neg Hx   . Blindness Neg Hx   . Cataracts Neg Hx   . Glaucoma Neg Hx   . Macular degeneration Neg Hx   . Retinal detachment Neg Hx   . Strabismus Neg Hx   . Retinitis pigmentosa Neg Hx     Impression/DX:  Jahkai Yandell. is a 60 year old male with history of type 2 diabetes with retinopathy, CABG, ICM chronic systolic congestive heart failure/ICD, chronic kidney disease, left foot wound, COVID-1 08/2020.  Patient was admitted on 09/28/20 with acute on chronic congestive heart failure as well as episodes of A. fib/a flutter and developed hypertensive requiring transfer to ICU.  Patient has been resistant to LVAD/transplant the patient was ultimately agreeable to undergo LVAD placement on 10/13/2020.  Patient developed acute on chronic renal failure requiring hemodialysis.  Patient had issues with  hypotensive and presyncopal events with jerking episodes and visual deficits on 4/3 and CT head and that was negative for acute changes.  Patient continuing to describe numbness in his right arm postsurgery as well.  Due to limited mobility/debility with functional deficits patient was admitted to CIR due to functional decline.  Patient was awake and alert sitting up on the edge of his bed as I entered the room.  Patient was oriented and aware of what had transpired with various medical interventions and his LVAD.  Patient talked about his prior reluctance for LVAD/transplant list placement as he was hoping he would improve without there is more dramatic intervention.  Patient realized that he was progressively failing from a cardiovascular status and family agreed to LVAD placement.  Patient reports that he is feeling stronger and more stable.  He was able to walk up 1 flight of steps today which she was quite pleased about.  However, his home and has multiple family members living and is 3 flights of steps to get into the front door.  The patient has not been able to accomplish that number of flights of stairs yet but they are actively working on increasing his status.  The patient has checked on the first floor unit but the units on the first and second floor have many fewer bedrooms then he and his family required.  Disposition/Plan:  Worked on coping and adjustment issues with extended hospital stay and significant cardiovascular issues with recent LVAD placement.  Diagnosis:    Debility        Electronically Signed   _______________________ Ilean Skill, Psy.D. Clinical Neuropsychologist

## 2020-11-23 NOTE — Progress Notes (Signed)
ANTICOAGULATION CONSULT NOTE - Follow Up Consult  Pharmacy Consult for Warfarin Indication: LVAD  Allergies  Allergen Reactions  . Meclizine Hcl Anaphylaxis and Swelling  . Ivabradine Nausea Only    Patient Measurements: Height: 5' 10.5" (179.1 cm) Weight: 83.6 kg (184 lb 3.2 oz) IBW/kg (Calculated) : 74.15 kg  Vital Signs: Temp: 97.8 F (36.6 C) (04/20 0517) Pulse Rate: 84 (04/20 0517)  Labs: Recent Labs    11/21/20 0508 11/22/20 0449 11/23/20 0400  HGB 8.6* 8.5* 8.7*  HCT 28.3* 27.6* 28.8*  PLT 190 174 151  LABPROT 25.9* 32.9* 30.4*  INR 2.4* 3.2* 2.9*  CREATININE 5.52* 6.86* 5.14*    Estimated Creatinine Clearance: 16 mL/min (A) (by C-G formula based on SCr of 5.14 mg/dL (H)).   Assessment: 60 yo male s/p new LVAD implant with HeartMate III on 3/10. Warfarin started per MD on 3/12, and pharmacy has now been consulted to take over warfarin dosing.   INR today down but still above goal at 2.9. The patient has been very sensitive to small warfarin dose changes and dose was held last night. The patient's diet has remained stable but nausea noted this morning. No bleeding issues noted.   Goal of Therapy: INR 2-2.5 Monitor platelets by anticoagulation protocol: Yes   Plan:  Warfarin 1mg  tonight Daily PT/INR  Monitor for signs and symptoms of bleeding  Erin Hearing PharmD., BCPS Clinical Pharmacist 11/23/2020 9:37 AM

## 2020-11-23 NOTE — Progress Notes (Signed)
LVAD Coordinator Rounding Note:  HM III LVAD implanted on 10/13/20 by Dr Cyndia Bent under Destination Therapy criteria.  Discharged from [facility] and admitted to [facility] 11/14/20.  Pt sitting up in wheelchair awake, pleasant. He is currently working with OT bathing and dressing himself. Complaining of 2/10 sacral pain while sitting in wheelchair.   Per OT memory assessment yesterday pt found to have mild short term memory deficits.   No family at bedside for dressing change education.   Pt has HD dialysis scheduled on Tues/Thurs/Sat. Tolerating well. Tunneled catheter in place.   Vital signs: Temp: 97.8 HR: 83 Doppler: 98 Auto cuff: not done O2 Sat: 99% on RA Wt: 183.6>190.9>194>185.4>188.6>184.2 lbs    LVAD interrogation reveals:  Speed: 5200 Flow: 4.4 Power: 3.7w PI: 3.6 Hematocrit: 28  Alarms: none Events: 4 PI today  Fixed speed: 5200 Low speed limit: 4900  Drive Line:  Existing VAD dressing clean, dry, intact. Drive line anchor correctly applied. Will advance to twice weekly while using acell. Next dressing change will be due 11/24/20. No family available for dressing change.   Labs:  LDH trend: 199>191>196>180>174>168>182   INR trend: 1.5>1.7>1.9>2.4>3.2>2.9   Anticoagulation Plan: -INR Goal: 2.0-2.5 -ASA Dose: stopped 30 days post op per protocol  Device:  -  Medtronic single -  Pacing: VVI 40 - Therapies: on 200 bpm  Plan/Recommendations:  1. Call VAD coordinator for equipment or drive line issues 2. Twice weekly dressing changes while using acell. Next dressing due 11/24/20.  Emerson Monte RN [place] Coordinator  Office: [phone]  24/7 Pager: [phone]

## 2020-11-23 NOTE — Progress Notes (Signed)
Patient ID: Marcus Ozaki., male   DOB: 12/03/1960, 60 y.o.   MRN: [id number] Patient ID: Marcus Eslick., male   DOB: Jan 22, 1961, 60 y.o.   MRN: [id number]     Advanced Heart Failure Rounding Note  PCP-Cardiologist: No primary care provider on file.   Subjective:    - 2/27 Milrinone switched to dobutamine 5 mcg.  - 2/28 Moved to ICU. Dobutamine increased to 7.5 mcg and Norepi 3 mcg added. Diuretics held.  - 3/1 Impella 5.5 placed.  - 3/4 Teeth removed - 3/10 HM3 LVAD placed - 3/13 VAD speed increased to 5500. [name] out  - 3/14 Nephrology consulted. Given 1UPRBCs  - 3/15 Started CRRT. Given 1UPRBCs.  - 3/23 VAD Speed turned down to 5300  - 3/23 CRRT and Milrinone Stopped. Back in Afib - 3/24 CRRT restarted.  - 3/27 CVVH off.  - 3/28 iHD - 3/29 iHD. Walked 75 feet!! - 3/20 Ramp Echo: Fixed speed: 5200 Low speed limit: 4900 - 4/3 S/P RIJ HD catheter.  - 4/10 60th B-day!  - 4/11 Transferred to CIR. Back on heparin drip.   Better today, no nausea this morning.  Not orthostatic when checked today.  Working with PT.   INR 2.9, No bleeding   LVAD Interrogation HM 3: Speed: 5200 Flow: 4.5 PI: 3.9  Power: 3.8. VAD interrogated personally. Parameters stable.   Objective:   Weight Range: 83.6 kg Body mass index is 26.06 kg/m.   Vital Signs:   Temp:  [97.8 F (36.6 C)-98.8 F (37.1 C)] 97.8 F (36.6 C) (04/20 0517) Pulse Rate:  [60-92] 84 (04/20 0517) Resp:  [14-20] 20 (04/20 0517) BP: (100-158)/(40-99) 129/99 (04/19 1931) SpO2:  [95 %-99 %] 99 % (04/20 0517) Weight:  [83.6 kg] 83.6 kg (04/20 0517) Last BM Date: 11/22/20  Weight change: Filed Weights   11/22/20 0500 11/23/20 0500 11/23/20 0517  Weight: 85.5 kg 83.6 kg 83.6 kg    Intake/Output:   Intake/Output Summary (Last 24 hours) at 11/23/2020 1044 Last data filed at 11/23/2020 0719 Gross per 24 hour  Intake 180 ml  Output 2100 ml  Net -1920 ml      Physical Exam   General: Well appearing  this am. NAD.  HEENT: Normal. Neck: Supple, JVP 7-8 cm. Carotids OK.  Cardiac:  Mechanical heart sounds with LVAD hum present.  Lungs:  CTAB, normal effort.  Abdomen:  NT, ND, no HSM. No bruits or masses. +BS  LVAD exit site: Well-healed and incorporated. Dressing dry and intact. No erythema or drainage. Stabilization device present and accurately applied. Driveline dressing changed daily per sterile technique. Extremities:  Warm and dry. No cyanosis, clubbing, rash, or edema.  Neuro:  Alert & oriented x 3. Cranial nerves grossly intact. Moves all 4 extremities w/o difficulty. Affect pleasant    Labs    CBC Recent Labs    11/22/20 0449 11/23/20 0400  WBC 9.5 8.9  HGB 8.5* 8.7*  HCT 27.6* 28.8*  MCV 89.0 89.7  PLT 174 124   Basic Metabolic Panel Recent Labs    11/22/20 0449 11/23/20 0400  NA 134* 136  K 4.9 4.4  CL 95* 96*  CO2 27 27  GLUCOSE 96 75  BUN 60* 38*  CREATININE 6.86* 5.14*  CALCIUM 9.0 9.3  MG 2.2 2.1  PHOS 6.8* 5.2*   Liver Function Tests Recent Labs    11/22/20 0449 11/23/20 0400  ALBUMIN 2.7* 2.7*   No results for input(s): LIPASE, AMYLASE in the  last 72 hours. Cardiac Enzymes No results for input(s): CKTOTAL, CKMB, CKMBINDEX, TROPONINI in the last 72 hours.  BNP: BNP (last 3 results) Recent Labs    11/02/20 2310 11/10/20 0150 11/17/20 0411  BNP 486.0* 905.1* 630.2*    ProBNP (last 3 results) No results for input(s): PROBNP in the last 8760 hours.   D-Dimer No results for input(s): DDIMER in the last 72 hours. Hemoglobin A1C No results for input(s): HGBA1C in the last 72 hours. Fasting Lipid Panel No results for input(s): CHOL, HDL, LDLCALC, TRIG, CHOLHDL, LDLDIRECT in the last 72 hours. Thyroid Function Tests No results for input(s): TSH, T4TOTAL, T3FREE, THYROIDAB in the last 72 hours.  Invalid input(s): FREET3  Other results:   Imaging    No results found.   Medications:     Scheduled Medications: . (feeding  supplement) PROSource Plus  30 mL Oral BID BM  . amiodarone  200 mg Oral Daily  . vitamin C  1,000 mg Oral Daily  . Chlorhexidine Gluconate Cloth  6 each Topical Q0600  . Chlorhexidine Gluconate Cloth  6 each Topical Q0600  . darbepoetin (ARANESP) injection - DIALYSIS  100 mcg Intravenous Q Tue-HD  . feeding supplement (NEPRO CARB STEADY)  237 mL Oral BID BM  . gabapentin  100 mg Oral QHS  . guaiFENesin  600 mg Oral BID  . insulin aspart  0-15 Units Subcutaneous TID WC  . insulin aspart  0-5 Units Subcutaneous QHS  . insulin glargine  14 Units Subcutaneous Daily  . metoCLOPramide  5 mg Oral Q8H  . midodrine  5 mg Oral TID WC  . multivitamin  1 tablet Oral QHS  . Muscle Rub   Topical BID  . pantoprazole  40 mg Oral Daily  . polyethylene glycol  17 g Oral BID  . rosuvastatin  20 mg Oral Daily  . scopolamine  1 patch Transdermal Q72H  . senna-docusate  2 tablet Oral BID  . sorbitol  30 mL Oral Daily  . warfarin  1 mg Oral ONCE-1600  . Warfarin - Pharmacist Dosing Inpatient   Does not apply q1600    Infusions: . sodium chloride 10 mL (11/17/20 1430)    PRN Medications: sodium chloride, acetaminophen, alteplase, alum & mag hydroxide-simeth, bisacodyl, dextrose, diphenhydrAMINE, guaiFENesin-dextromethorphan, heparin, lidocaine-prilocaine, ondansetron (ZOFRAN) IV, oxyCODONE, pentafluoroprop-tetrafluoroeth, prochlorperazine **OR** prochlorperazine **OR** prochlorperazine, sodium chloride flush, sodium phosphate, traZODone, white petrolatum    Assessment/Plan   1.Acute on chronic systolic HF -> cardiogenic shock:ICM.Has[employer]tronic ICD. EF 15%. Low output persisted despite milrinone and then dobutamine with rise in creatinine to 2.5. Impella 5.5 placed 3/1.  Creatinine improved to 1.7 and HM3 LVAD was placed on 3/10. Developed post-op renal failure requiring CVVH. Now off milrinone and epinephrine. Last echo showed moderate RV enlargement with mildly decreased RV function and VAD speed  decreased to 5300 due to left-sided septum. CRRT stopped 3/23 but restarted 3/24. CVVH stopped again on 3/27. Ramp echo 3/30 with speed decreased to 5200. Tolerating iHD for volume management.  MAP 80s.   - Continue midodrine to 5 tid, not orthostatic today.   - Continue compression stockings.  - HD for volume management, next tomorrow.   2. LVAD: 3/13 VAD speed increased to 5500. 3/23 speed decreased to 5300 and decreased to 5200 on 3/30. VAD interrogated personally. LDH 182, INR 2.9 today  - On warfarin, on ASA 81 daily.  Discussed warfarin dosing with PharmD personally. - DL site ok 3. CAD s/p [id number]:Last cath in 6/19 with patent  grafts. - Continue Crestor 40 mg daily.  4. AKI on CKD Stage 3b: Suspect that this is a combination of cardiorenal syndrome and diabetic nephropathy and possibly ATN. Developed post-op AKI. Started on CRRT 3/15. CVVH stopped 3/23 but restarted 3/24. Now getting iHD, looks like he will be HD-dependent.  Has tunneled catheter.  - HD T/Th/S - Dr Augustin Coupe to talk with [facility], but with continuous flow LVAD, think AV fistula would be less likely to mature.  Will await decision by VVS and nephrology in terms of access.  5. Atrial flutter/fibrillation: s/p DC-CV 08/21/18.  - Continue amio 200 mg daily.   - Remains in NSR 6. Wound R Foot: Partial thickness skin loss noted. Cover with mepilex border and change every 5 days. Resolved.  7. Right arm pain: Likely neuropathic from stretch of brachial plexus with Impella placement.  - Continue gabapentin.  8. ID:  WBCs normal. Afebrile. Blood Cx- NGTD.  9. PB:AQVOHCS of very poor control but hgbA1ctrending down. Most recent 7.7%.  Insulin drip stopped 3/24. Blood glucose controlled on reduced Lantus and  sliding scale insulin to moderate.   - Diabetes coordinators have seen 10. Anemia: Has been stable. hgb 8.5 today.  11. Deconditioning - Continue to work with CIR 12. Unstageable Pressure Ulcer on buttocks: Much improved -  Hydrotherapy completed - Continue repositioning 13. NSVT: No longer on the monitor.  14. Dizziness: Still with symptoms that seem like BPPV at times. Has had Epley maneuvers.  15. Nausea/vomiting: periodically occurring overnight/in the early am, seems better today. Continue symptomatic treatment.    Loralie Champagne, MD  10:44 AM

## 2020-11-23 NOTE — Progress Notes (Signed)
Physical Therapy Session Note  Patient Details  Name: Marcus Johnson. MRN: [id number] Date of Birth: June 07, 1961  Today's Date: 11/23/2020 PT Individual Time: 1300-1400 PT Individual Time Calculation (min): 60 min   Short Term Goals: Week 1:  PT Short Term Goal 1 (Week 1): Patient will perform bed mobility with min A consistently. PT Short Term Goal 1 - Progress (Week 1): Met PT Short Term Goal 2 (Week 1): Patient will perform basic transfers with CGA using LRAD. PT Short Term Goal 2 - Progress (Week 1): Progressing toward goal PT Short Term Goal 3 (Week 1): Patient will ambulate >50 ft without symptoms using LRAD. PT Short Term Goal 3 - Progress (Week 1): Partly met (met x1, but not consistent) PT Short Term Goal 4 (Week 1): Patient will tolerate sitting OOB >1 hour. PT Short Term Goal 4 - Progress (Week 1): Met PT Short Term Goal 5 (Week 1): Patient will initate stair training. PT Short Term Goal 5 - Progress (Week 1): Progressing toward goal Week 2:  PT Short Term Goal 1 (Week 2): Patient will perform basic transfers with CGA using LRAD consistently. PT Short Term Goal 2 (Week 2): Patient will consistently ambulate >50 ft using LRAD with CGA. PT Short Term Goal 3 (Week 2): Patient will initiate stair training.  Skilled Therapeutic Interventions/Progress Updates:   Pt received sitting in EOB and agreeable to PT. NT present to assess VS. dinamap unable to calculate BP. REN then asked to performed via doppler. MAP ~90. Per RN.   Stand pivot transfer to [facility] with Min assist and RW. Pt transported to day room. Gait training with RW x68ft with min assist and min cues for safety of transition to sitting in WC. Dynamic gait training with RW to weave through 8 cones with min A from PT for safety. Pt  Noted to have difficulty navigating around cones on the R side with intermittent myoclonic jerking for last 33ft of gait training. Min assist to pivot in front of WC due to difficluty with  motor plan.   Kinetron 2 x 30sec and 2 x 45se at 35cm/sec. Cues for full ROM and pursed lip breathing. SpO2 >95% throughout. Pt rated RPE 5-6/10 upon completeion of each bout.   Patient returned to room and performed stand pivot to EOB with min assist and RW. Pt left sitting EOB with call bell in reach and all needs met.          Therapy Documentation Precautions:  Precautions Precautions: Fall,Sternal,Other (comment) Precaution Comments: LVAD, sacral wound, Left BPPV Required Braces or Orthoses: Other Brace Other Brace: thumb loop and buddy strap R hand Restrictions Weight Bearing Restrictions: Yes RUE Weight Bearing:  (sternal) Other Position/Activity Restrictions: sternal precautions    Vital Signs: Therapy Vitals Temp: 98.4 F (36.9 C) Pulse Rate: 87 Resp: 16 BP: (!) 152/138 Patient Position (if appropriate): Sitting Oxygen Therapy SpO2: 97 % O2 Device: Room Air Pain:   denies  Therapy/Group: Individual Therapy  Lorie Phenix 11/23/2020, 1:58 PM

## 2020-11-23 NOTE — Progress Notes (Signed)
Occupational Therapy Session Note  Patient Details  Name: Marcus Johnson. MRN: [id number] Date of Birth: 10/29/1960  Today's Date: 11/23/2020 OT Individual Time: 3174-0992 OT Individual Time Calculation (min): 60 min    Short Term Goals: Week 2:  OT Short Term Goal 1 (Week 2): Pt will complete toilet transfer with supervision OT Short Term Goal 2 (Week 2): Pt will complete LB dressing with CGA OT Short Term Goal 3 (Week 2): Pt will appropriately instruct a caregiver to change LVAD to battery power  Skilled Therapeutic Interventions/Progress Updates:    Pt supine upon arrival with 3/10 pain in his bottom, reporting a night of little sleep d/t frequent interruptions. Discussed results from yesterdays MOCA and resultant mild cognitive impairment assessment. Pt completed bed mobility with CGA to EOB. He stood with CGA and completed stand pivot transfer to w/c with CGA using RW. Great morning for pt! Pt completed UB bathing with min cueing for initiation and thoroughness and min A for donning shirt. Mod A to don pants- pt unable to reach distally. No reacher available to practice AE use. Pt required cueing for LVAD line management. Pt was able to sit > stand several times with just CGA. He pulled up pants with close supervision in standing. BP taken in standing- MAP was 78. Rest breaks required intermittently between each step of b/d. Pt was encouraged to remain seated in the w/c instead of return to bed, he was agreeable for 30 min. NT alerted to need to return to bed in 30 min. Pt left sitting up, all needs met, nephrology DO in room.   Therapy Documentation Precautions:  Precautions Precautions: Fall,Sternal,Other (comment) Precaution Comments: LVAD, sacral wound, Left BPPV Required Braces or Orthoses: Other Brace Other Brace: thumb loop and buddy strap R hand Restrictions Weight Bearing Restrictions: Yes RUE Weight Bearing:  (sternal) Other Position/Activity Restrictions: sternal  precautions  Therapy/Group: Individual Therapy  Curtis Sites 11/23/2020, 6:31 AM

## 2020-11-23 NOTE — Progress Notes (Signed)
PROGRESS NOTE   Subjective/Complaints: Had a better night. Feels that mucinex helped cough. Less nausea this morning. Doing well with OT so far. MAP in standing was 78 and no light-headedness experienced  ROS: Patient denies fever, rash, sore throat, blurred vision, nausea, vomiting, diarrhea, cough, shortness of breath or chest pain,  headache, or mood change. .    Objective:   No results found. Recent Labs    11/22/20 0449 11/23/20 0400  WBC 9.5 8.9  HGB 8.5* 8.7*  HCT 27.6* 28.8*  PLT 174 151   Recent Labs    11/22/20 0449 11/23/20 0400  NA 134* 136  K 4.9 4.4  CL 95* 96*  CO2 27 27  GLUCOSE 96 75  BUN 60* 38*  CREATININE 6.86* 5.14*  CALCIUM 9.0 9.3    Intake/Output Summary (Last 24 hours) at 11/23/2020 0837 Last data filed at 11/23/2020 0719 Gross per 24 hour  Intake 180 ml  Output 2100 ml  Net -1920 ml     Pressure Injury 10/16/20 Coccyx Mid Stage 3 -  Full thickness tissue loss. Subcutaneous fat may be visible but bone, tendon or muscle are NOT exposed. Blister that has drained, 2 cm X 4.5 cm (Active)  10/16/20 0800  Location: Coccyx  Location Orientation: Mid  Staging: Stage 3 -  Full thickness tissue loss. Subcutaneous fat may be visible but bone, tendon or muscle are NOT exposed. (as per WOC note on 11/14/20)  Wound Description (Comments): Blister that has drained, 2 cm X 4.5 cm  Present on Admission: No     Pressure Injury 10/16/20 Coccyx Right Stage 2 -  Partial thickness loss of dermis presenting as a shallow open injury with a red, pink wound bed without slough. blister that has drained, 0.5 cm X 0.5 cm (Active)  10/16/20 0800  Location: Coccyx  Location Orientation: Right  Staging: Stage 2 -  Partial thickness loss of dermis presenting as a shallow open injury with a red, pink wound bed without slough.  Wound Description (Comments): blister that has drained, 0.5 cm X 0.5 cm  Present on  Admission: No    Physical Exam: Vital Signs Blood pressure (!) 129/99, pulse 84, temperature 97.8 F (36.6 C), resp. rate 20, height 5' 10.5" (1.791 m), weight 83.6 kg, SpO2 99 %. Constitutional: No distress . Vital signs reviewed. HEENT: EOMI, oral membranes moist Neck: supple Cardiovascular: LVAD hum    Respiratory/Chest: CTA Bilaterally without wheezes or rales. Normal effort    GI/Abdomen: BS +, non-tender, non-distended Ext: no clubbing, cyanosis, or edema Psych: pleasant and cooperative, sl anxious Skin: tunneled cath right shoulder in place. Right foot intact. Sacral wound  superficial with predominant granulation tissue. Scopolamine patch fell off neck Neuro: Pt is cognitively appropriate with normal insight, memory, and awareness. Cranial nerves 2-12 are intact. Sensory exam is normal. Reflexes are 2+ in all 4's. Fine motor coordination is intact. R>L UE tremors. Motor function is grossly 4/5 with tremor. RUE  Remains 3-4/5 but still limited d/t pain in shoulder. has scattered sensory loss throughout right arm. LE motor 3-4/5  Musculoskeletal: sensitivity right elbow to an extent    Assessment/Plan: 1. Functional deficits which require  3+ hours per day of interdisciplinary therapy in a comprehensive inpatient rehab setting.  Physiatrist is providing close team supervision and 24 hour management of active medical problems listed below.  Physiatrist and rehab team continue to assess barriers to discharge/monitor patient progress toward functional and medical goals  Care Tool:  Bathing  Bathing activity did not occur:  (time constraints) Body parts bathed by patient: Left arm,Abdomen,Chest,Front perineal area,Right upper leg,Left upper leg,Face,Right arm,Buttocks   Body parts bathed by helper: Right lower leg,Left lower leg     Bathing assist Assist Level: Moderate Assistance - Patient 50 - 74%     Upper Body Dressing/Undressing Upper body dressing Upper body  dressing/undressing activity did not occur (including orthotics): Environmental limitations (PICC running) What is the patient wearing?: Pull over shirt    Upper body assist Assist Level: Minimal Assistance - Patient > 75%    Lower Body Dressing/Undressing Lower body dressing      What is the patient wearing?: Pants     Lower body assist Assist for lower body dressing: Moderate Assistance - Patient 50 - 74%     Toileting Toileting Toileting Activity did not occur Landscape architect and hygiene only): N/A (no void or bm)  Toileting assist Assist for toileting: Dependent - Patient 0%     Transfers Chair/bed transfer  Transfers assist  Chair/bed transfer activity did not occur: Safety/medical concerns  Chair/bed transfer assist level: Contact Guard/Touching assist Chair/bed transfer assistive device: Programmer, multimedia   Ambulation assist   Ambulation activity did not occur: Safety/medical concerns  Assist level: Contact Guard/Touching assist Assistive device: Walker-rolling Max distance: 30   Walk 10 feet activity   Assist  Walk 10 feet activity did not occur: Safety/medical concerns  Assist level: Contact Guard/Touching assist Assistive device: Walker-rolling   Walk 50 feet activity   Assist Walk 50 feet with 2 turns activity did not occur: Safety/medical concerns  Assist level: Contact Guard/Touching assist Assistive device: Walker-rolling    Walk 150 feet activity   Assist Walk 150 feet activity did not occur: Safety/medical concerns         Walk 10 feet on uneven surface  activity   Assist Walk 10 feet on uneven surfaces activity did not occur: Safety/medical concerns         Wheelchair     Assist Will patient use wheelchair at discharge?: No (Per PT long term goals)   Wheelchair activity did not occur: Safety/medical concerns         Wheelchair 50 feet with 2 turns activity    Assist    Wheelchair 50  feet with 2 turns activity did not occur: Safety/medical concerns       Wheelchair 150 feet activity     Assist  Wheelchair 150 feet activity did not occur: Safety/medical concerns       Blood pressure (!) 129/99, pulse 84, temperature 97.8 F (36.6 C), resp. rate 20, height 5' 10.5" (1.791 m), weight 83.6 kg, SpO2 99 %.  Medical Problem List and Plan: 1.Functional and mobility deficitssecondary to debility after acute on chronic CHF with subsequent LVAD placement -patient maynotshower -ELOS/Goals: ?2 weeks+   -Continue CIR therapies including PT, OT   -pt with STM deficits  -vestibular evaluation inconclusive   - scopolamine patch for symptom relief   2. Chronic systolic CHF s/p LVAD 3:  -DVT/anticoagulation:Pharmaceutical:Continue Coumadin, INR 2.1 on 4/17 -antiplatelet therapy: ASA 3.Right arm neuropathic pain/brachial plexus injury/Pain Management:Tylenol or oxycodone prn.  --Increased gabapentin to 200  mgat bedtime with some benefit -sling for RUE support and comfort  -expect good recovery of motor/sensory function in RUE  4. Mood:LCSW to follow for evaluation and support. -antipsychotic agents: N/A 5. Neuropsych: This patientiscapable of making decisions on hisown behalf. 6. Skin/Wound Care:Optimize nutritional status--continue Nephro and prosource.  --Sacral decubitus: Air mattress.    -appears much improved-->WTD dressing   -pt is NO LONGER RECEIVING HYDRO --Right foot wound resolved.  7. Fluids/Electrolytes/Nutrition:Strict I/O. Continue to monitor labs with HD? 8. Chronic systolic CHF s/p LVAD 3:  --Cardiology to follow and manage cardiac issues.  -daily weights Filed Weights   11/22/20 0500 11/23/20 0500 11/23/20 0517  Weight: 85.5 kg 83.6 kg 83.6 kg    4/13 midodrine adjusted to 5mg  TID daily  4/18 bp/volume mgt per  nephro and cards.    -off revatio   -TEDS, acclimation  4/20 MAP 78 in standing, continue to observe 9. Acute on chronic anemia: Continue to monitor H/H/ monitor for signs of bleeding.  --Was transfused with multiple units PRBC. --ON Nulecit Tu/Th/Sa  -transfuse as needed in HD 8.6 4/18 10. T2DM: Hgb A1c- monitor BS ac/hs.BS reasonable on current intake --continue Lantus 15 units/am with SSI for elevated BS.   4/17 CBG 65-118: decreased lantus to 14U  4/20 controlled today CBG (last 3)  Recent Labs    11/22/20 1800 11/22/20 2109 11/23/20 0628  GLUCAP 72 121* 74    11. ESRD: Hyperkalemia being managed with lokelma/HD. -On regular diet w/o FR --question due to intake. Phosphorous levels trending up.  --Scheduled HD at the end of the day to help with tolerance of activity during the day. --RD consulted for diet education (multiple restrictions) 12. Mild leucocytosis: Resolved  13. A fib: NSR, HR remains controlled on Amiodarone, continue 14. Nausea:   -reglan resumed yesterday  -triggered by productive cough?---added mucinex with benefit  -needs to have regular bm's.   -on miralax and senna-s bid    LOS: 9 days A FACE TO FACE EVALUATION WAS PERFORMED  Meredith Staggers 11/23/2020, 8:37 AM

## 2020-11-24 DIAGNOSIS — I5022 Chronic systolic (congestive) heart failure: Secondary | ICD-10-CM | POA: Diagnosis not present

## 2020-11-24 DIAGNOSIS — I951 Orthostatic hypotension: Secondary | ICD-10-CM | POA: Diagnosis not present

## 2020-11-24 DIAGNOSIS — Z95811 Presence of heart assist device: Secondary | ICD-10-CM | POA: Diagnosis not present

## 2020-11-24 DIAGNOSIS — R5381 Other malaise: Secondary | ICD-10-CM | POA: Diagnosis not present

## 2020-11-24 LAB — RENAL FUNCTION PANEL
Albumin: 2.7 g/dL — ABNORMAL LOW (ref 3.5–5.0)
Anion gap: 11 (ref 5–15)
BUN: 54 mg/dL — ABNORMAL HIGH (ref 6–20)
CO2: 27 mmol/L (ref 22–32)
Calcium: 9.3 mg/dL (ref 8.9–10.3)
Chloride: 99 mmol/L (ref 98–111)
Creatinine, Ser: 6.66 mg/dL — ABNORMAL HIGH (ref 0.61–1.24)
GFR, Estimated: 9 mL/min — ABNORMAL LOW (ref >60)
Glucose, Bld: 86 mg/dL (ref 70–99)
Phosphorus: 6 mg/dL — ABNORMAL HIGH (ref 2.5–4.6)
Potassium: 4.6 mmol/L (ref 3.5–5.1)
Sodium: 137 mmol/L (ref 135–145)

## 2020-11-24 LAB — CBC
HCT: 28.2 % — ABNORMAL LOW (ref 39.0–52.0)
Hemoglobin: 8.6 g/dL — ABNORMAL LOW (ref 13.0–17.0)
MCH: 27.1 pg (ref 26.0–34.0)
MCHC: 30.5 g/dL (ref 30.0–36.0)
MCV: 89 fL (ref 80.0–100.0)
Platelets: 172 10*3/uL (ref 150–400)
RBC: 3.17 MIL/uL — ABNORMAL LOW (ref 4.22–5.81)
RDW: 16.8 % — ABNORMAL HIGH (ref 11.5–15.5)
WBC: 9.7 10*3/uL (ref 4.0–10.5)
nRBC: 0 % (ref 0.0–0.2)

## 2020-11-24 LAB — BRAIN NATRIURETIC PEPTIDE: B Natriuretic Peptide: 723.8 pg/mL — ABNORMAL HIGH (ref 0.0–100.0)

## 2020-11-24 LAB — GLUCOSE, CAPILLARY
Glucose-Capillary: 86 mg/dL (ref 70–99)
Glucose-Capillary: 97 mg/dL (ref 70–99)
Glucose-Capillary: 64 mg/dL — ABNORMAL LOW (ref 70–99)
Glucose-Capillary: 67 mg/dL — ABNORMAL LOW (ref 70–99)
Glucose-Capillary: 93 mg/dL (ref 70–99)
Glucose-Capillary: 110 mg/dL — ABNORMAL HIGH (ref 70–99)

## 2020-11-24 LAB — PROTIME-INR
INR: 3.2 — ABNORMAL HIGH (ref 0.8–1.2)
Prothrombin Time: 32.8 seconds — ABNORMAL HIGH (ref 11.4–15.2)

## 2020-11-24 LAB — MAGNESIUM: Magnesium: 2.3 mg/dL (ref 1.7–2.4)

## 2020-11-24 LAB — LACTATE DEHYDROGENASE: LDH: 177 U/L (ref 98–192)

## 2020-11-24 MED ORDER — METOCLOPRAMIDE HCL 5 MG PO TABS
5.0000 mg | ORAL_TABLET | Freq: Three times a day (TID) | ORAL | Status: DC
  Administered 2020-11-24 – 2020-11-29 (×18): 5 mg via ORAL
  Filled 2020-11-24 (×20): qty 1

## 2020-11-24 MED ORDER — HEPARIN SODIUM (PORCINE) 1000 UNIT/ML IJ SOLN
INTRAMUSCULAR | Status: AC
  Administered 2020-11-24: 1000 [IU]
  Filled 2020-11-24: qty 4

## 2020-11-24 NOTE — Progress Notes (Signed)
Physical Therapy Session Note  Patient Details  Name: Marcus Johnson. MRN: [id number] Date of Birth: [birth year]  Today's Date: 11/24/2020 PT Individual Time: 1000-1045 PT Individual Time Calculation (min): 45 min   Short Term Goals: Week 2:  PT Short Term Goal 1 (Week 2): Patient will perform basic transfers with CGA using LRAD consistently. PT Short Term Goal 2 (Week 2): Patient will consistently ambulate >50 ft using LRAD with CGA. PT Short Term Goal 3 (Week 2): Patient will initiate stair training.  Skilled Therapeutic Interventions/Progress Updates:     Patient in bed upon PT arrival. Patient alert and agreeable to PT session. Patient denied pain during session, reported poor sleep due to multiple bouts of emesis last night.   Therapeutic Activity: Bed Mobility: Patient performed supine to/from sit with supervision-mod I in a flat bed without use of bed rail. Patient sat EOB with mod I and verbally walked PT through steps to switch over his LVAD lines from the monitor to the battery packs to simulate teaching a family member, as the patient cannot manage the lines himself due to upper extremity weakness and coordination deficits.  Transfers: Patient performed stand pivot bed<>w/c and sit to/from stand x1 with CGA with and without RW. Provided verbal cues for forward weight shift, patient able to teach-back hand placement on RW.  Gait Training:  Patient ascended/descended 4-6" steps using L rail with side-stepping technique to simulate home entry with CGA-min A while ascending and mod-max A while descending due to symptoms of "light headedness" at the top of the stairs. +2 assist to descend the last 2 steps and provide w/c for safety. Performed step-to gait pattern leading with R while ascending and L while descending. Provided cues for technique and sequencing. Had patient attempted to rest and perform diaphragmatic breathing at onset of symptoms at the top of the steps with  minimal improvement. Patient with delayed processing and difficulty following cues on descent due to symptoms. Provided hand-over-hand assist for hand placement and tactile cues for lower extremity advancement on descent. Returned patient to his room.   Vitals: MAP 108 x2 with symptoms persisting, patient returned to supine in the bed to rest, as above, and Dr. Naaman Plummer, MD, and Benjie Karvonen, LPN made aware.   Patient in bed at end of session with breaks locked, 4 rails up per patient request for safety, and all needs within reach.    Therapy Documentation Precautions:  Precautions Precautions: Fall,Sternal,Other (comment) Precaution Comments: LVAD, sacral wound, Left BPPV Required Braces or Orthoses: Other Brace Other Brace: thumb loop and buddy strap R hand Restrictions Weight Bearing Restrictions: Yes RUE Weight Bearing:  (sternal) Other Position/Activity Restrictions: sternal precautions   Therapy/Group: Individual Therapy  Azelyn Batie L Cierria Height PT, DPT  11/24/2020, 10:09 PM

## 2020-11-24 NOTE — Progress Notes (Signed)
ANTICOAGULATION CONSULT NOTE - Follow Up Consult  Pharmacy Consult for Warfarin Indication: LVAD  Allergies  Allergen Reactions  . Meclizine Hcl Anaphylaxis and Swelling  . Ivabradine Nausea Only    Patient Measurements: Height: 5' 10.5" (179.1 cm) Weight: 82.5 kg (181 lb 12.8 oz) IBW/kg (Calculated) : 74.15 kg  Vital Signs: Temp: 97.8 F (36.6 C) (04/21 1250) Temp Source: Oral (04/21 1250) BP: 88/74 (04/21 0543) Pulse Rate: 62 (04/21 1250)  Labs: Recent Labs    11/22/20 0449 11/23/20 0400 11/24/20 0454  HGB 8.5* 8.7* 8.6*  HCT 27.6* 28.8* 28.2*  PLT 174 151 172  LABPROT 32.9* 30.4* 32.8*  INR 3.2* 2.9* 3.2*  CREATININE 6.86* 5.14* 6.66*    Estimated Creatinine Clearance: 12.4 mL/min (A) (by C-G formula based on SCr of 6.66 mg/dL (H)).   Assessment: 60 yo male s/p new LVAD implant with HeartMate III on 3/10. Warfarin started per MD on 3/12, and pharmacy has now been consulted to take over warfarin dosing.   INR was trending back down yesterday but now back up to 3.2 after low dose overnight. The patient has been very sensitive to small warfarin dose changes and dose was held last night. The patient's diet has remained stable.. No bleeding issues noted.   Goal of Therapy: INR 2-2.5 Monitor platelets by anticoagulation protocol: Yes   Plan:  Hold warfarin tonight and let INR drift down Daily PT/INR  Monitor for signs and symptoms of bleeding  Erin Hearing PharmD., BCPS Clinical Pharmacist 11/24/2020 2:08 PM

## 2020-11-24 NOTE — Progress Notes (Signed)
Occupational Therapy Session Note  Patient Details  Name: Marcus Johnson. MRN: [id number] Date of Birth: 14-Oct-1960  Today's Date: 11/24/2020 OT Individual Time: 9735-3299 OT Individual Time Calculation (min): 73 min   AND  45 min (15 min missed d/t elevated MAP)   Short Term Goals: Week 2:  OT Short Term Goal 1 (Week 2): Pt will complete toilet transfer with supervision OT Short Term Goal 2 (Week 2): Pt will complete LB dressing with CGA OT Short Term Goal 3 (Week 2): Pt will appropriately instruct a caregiver to change LVAD to battery power  Skilled Therapeutic Interventions/Progress Updates:    1;1. Session 1: pt received in bed reporting bad night with emesis episode around 3am. Pt agreeable to OT. Pt completes supine>sitting EOB with MIN A for trunk elevation while maintaining sternal recautions. Pt completes SPT to w/c with MIN A and mild post lean. Pt BP 80/79 with dynamap. Pt without dizziness just "shaky." Pt completes grooming at sink with VC for use of RUE as support to LUE for managing ADL items. Pt requires total A for shaving d/t safety. Pt able to verbalize steps for LVAD management with min cuing demo improvement from last week. Pt completes standing checkers game ~18 min with no seated rest break with min VC for rule following throughout. Pt cued to use RUE to play bottom pieces and LUE for top. Exited session with pt seated in bed, exit alarm on and call light in reach  Session 2: Pt received in bed and LPN demo to OT how to take doppler. MAP 92. Pt requires 15 min for MAP to decrease to 82 (OT checked this time). Pt completes sup>sit with MIN A for trunk elevation. Significantly increased time and cuing provided to orient shirt to self and VC for hemi techniques. MIN Overall for donning shirt. OT dmeo sock aide. Pt requires MIN A to put sock onto top of sock aide but uses BUE and cuing to pull down. S overall for pulling up onto foot. Returned to laying down with pt  reporting nausea and RN delivers meds. Exited session with pt seated in bed, exit alarm on and call light in reach   Therapy Documentation  Precautions:  Precautions Precautions: Fall,Sternal,Other (comment) Precaution Comments: LVAD, sacral wound, Left BPPV Required Braces or Orthoses: Other Brace Other Brace: thumb loop and buddy strap R hand Restrictions Weight Bearing Restrictions: Yes RUE Weight Bearing:  (sternal) Other Position/Activity Restrictions: sternal precautions General:   Vital Signs: Therapy Vitals Temp: 98 F (36.7 C) Pulse Rate: 62 Resp: 17 BP: (!) 88/74 Patient Position (if appropriate): Lying Oxygen Therapy SpO2: 99 % O2 Device: Room Air Pain:   ADL: ADL Eating: Set up Where Assessed-Eating: Bed level Grooming: Minimal assistance Where Assessed-Grooming: Edge of bed Upper Body Bathing: Unable to assess Lower Body Bathing: Unable to assess Upper Body Dressing: Unable to assess Lower Body Dressing: Maximal assistance Toileting: Unable to assess Toilet Transfer: Unable to assess Tub/Shower Transfer Method: Unable to assess Gaffer Transfer: Unable to assess Vision   Perception    Praxis   Exercises:   Other Treatments:     Therapy/Group: Individual Therapy  Tonny Branch 11/24/2020, 6:47 AM

## 2020-11-24 NOTE — Progress Notes (Signed)
Marcus Johnson  [facility] Progress Note     Assessment/ Plan:   Acute kidney injury on chronic kidney disease stage III: With underlying chronic kidney disease likely from diabetes/hypertension and worsening renal function from hemodynamically mediated ATN and cardiorenal syndrome. CRRT 3/15-3/22, 3/24-27; HD since 3/28. -Unfortunately, no signs of renal recovery currently - s/p [facility] 11/07/20 with IR, appreciate assistance - HD TTS schedule now; tolerating well, dialysis last on 4/19, HD today - Outpt CLIP underway--> renal navigator following;  -Previous provider d/w the patient and his son referring for permanent access placement. Also discuss with Dr. Aundra Dubin and Dr. Donzetta Matters whether in the subacute setting with a LVAD (placed 3/10) whether the timing is reasonable. Eventually would like to have a permanent acces in the arm given the risk of infection with a tunneled catheter. Would prefer graft unless vein caliber is excellent   Acute exacerbation of chronic systolic heart failure with cardiogenic shock: LVAD, placed 3/10.  Volume status/UF managed with RRT.   Per above UF to euvolemia.  midodrine and warfarin, off Revatio d/t orthostasis   Anemia: Stable. Likely secondary to chronic disease with CKD contributing.  Transfuse PRBC as needed.  Iron deficient, has been repleted IV.  Continue Aranesp 100 MCG weekly on Tuesday  Renal osteodystrophy:  Phosphorous 6.0  Atrial fibrillation: per cardiology, on amiodarone   Protein calorie malnutrition, hypoalbuminemia: push protein, per primary   DM II: per primary  Dispo: patient able to do HD in chair; working on setting up Outpatient dialysis  Subjective:   No significant overnight events.    Objective:   BP (!) 88/74 (BP Location: Left Arm)   Pulse 62   Temp 98 F (36.7 C)   Resp 17   Ht 5' 10.5" (1.791 m)   Wt 82.5 kg   SpO2 99%   BMI 25.72 kg/m   Intake/Output Summary (Last 24 hours) at 11/24/2020 0949 Last data filed at  11/24/2020 0700 Gross per 24 hour  Intake 600 ml  Output 300 ml  Net 300 ml   Weight change: -1.089 kg  Physical Exam: GEN: elderly male resting in bed, in no acute distress  CV: regular rate, mechanical hum, RUE dialysis catheter  RESP: no increased work of breathing, clear lungs bilaterally  ABD: soft, non-tender, bowel sounds present, non-distended  MSK: no LE edema SKIN: warm, dry     Imaging: No results found.  Labs: BMET Recent Labs  Lab 11/18/20 0428 11/19/20 0435 11/20/20 0418 11/21/20 0508 11/22/20 0449 11/23/20 0400 11/24/20 0454  NA 134* 137 135 135 134* 136 137  K 4.0 4.5 3.9 4.0 4.9 4.4 4.6  CL 97* 97* 96* 95* 95* 96* 99  CO2 30 27 30 28 27 27 27   GLUCOSE 91 119* 86 79 96 75 86  BUN 29* 48* 29* 43* 60* 38* 54*  CREATININE 4.35* 5.88* 4.08* 5.52* 6.86* 5.14* 6.66*  CALCIUM 9.1 9.4 9.0 9.3 9.0 9.3 9.3  PHOS 4.4 5.6* 4.2 5.7* 6.8* 5.2* 6.0*   CBC Recent Labs  Lab 11/21/20 0508 11/22/20 0449 11/23/20 0400 11/24/20 0454  WBC 9.3 9.5 8.9 9.7  HGB 8.6* 8.5* 8.7* 8.6*  HCT 28.3* 27.6* 28.8* 28.2*  MCV 89.3 89.0 89.7 89.0  PLT 190 174 151 172    Medications:    . (feeding supplement) PROSource Plus  30 mL Oral BID BM  . amiodarone  200 mg Oral Daily  . vitamin C  1,000 mg Oral Daily  . Chlorhexidine Gluconate Cloth  6 each Topical Q0600  . Chlorhexidine Gluconate Cloth  6 each Topical Q0600  . darbepoetin (ARANESP) injection - DIALYSIS  100 mcg Intravenous Q Tue-HD  . feeding supplement (NEPRO CARB STEADY)  237 mL Oral BID BM  . gabapentin  100 mg Oral QHS  . guaiFENesin  600 mg Oral BID  . insulin aspart  0-15 Units Subcutaneous TID WC  . insulin aspart  0-5 Units Subcutaneous QHS  . insulin glargine  14 Units Subcutaneous Daily  . metoCLOPramide  5 mg Oral TID AC & HS  . midodrine  5 mg Oral TID WC  . multivitamin  1 tablet Oral QHS  . Muscle Rub   Topical BID  . pantoprazole  40 mg Oral Daily  . polyethylene glycol  17 g Oral BID  .  rosuvastatin  20 mg Oral Daily  . scopolamine  1 patch Transdermal Q72H  . senna-docusate  2 tablet Oral BID  . sorbitol  30 mL Oral Daily  . Warfarin - Pharmacist Dosing Inpatient   Does not apply [place], DO  11/24/2020, 9:49 AM

## 2020-11-24 NOTE — Significant Event (Signed)
Hypoglycemic Event  CBG: 64  Treatment: 8 oz juice/soda  Symptoms: None  Follow-up CBG: Time: 1800 CBG Result: 67  Possible Reasons for Event: Inadequate meal intake   Benjie Karvonen L Violanda Bobeck

## 2020-11-24 NOTE — Progress Notes (Signed)
PROGRESS NOTE   Subjective/Complaints: Woke up at 0300 and vomited 300cc. Denies associated coughing. Did have good day with therapy yesterday. Did a flight of stairs  ROS: Patient denies fever, rash, sore throat, blurred vision, nausea,   diarrhea, cough, shortness of breath or chest pain, joint or back pain, headache, or mood change.  .    Objective:   No results found. Recent Labs    11/23/20 0400 11/24/20 0454  WBC 8.9 9.7  HGB 8.7* 8.6*  HCT 28.8* 28.2*  PLT 151 172   Recent Labs    11/23/20 0400 11/24/20 0454  NA 136 137  K 4.4 4.6  CL 96* 99  CO2 27 27  GLUCOSE 75 86  BUN 38* 54*  CREATININE 5.14* 6.66*  CALCIUM 9.3 9.3    Intake/Output Summary (Last 24 hours) at 11/24/2020 0923 Last data filed at 11/24/2020 0700 Gross per 24 hour  Intake 600 ml  Output 300 ml  Net 300 ml     Pressure Injury 10/16/20 Coccyx Mid Stage 3 -  Full thickness tissue loss. Subcutaneous fat may be visible but bone, tendon or muscle are NOT exposed. Blister that has drained, 2 cm X 4.5 cm (Active)  10/16/20 0800  Location: Coccyx  Location Orientation: Mid  Staging: Stage 3 -  Full thickness tissue loss. Subcutaneous fat may be visible but bone, tendon or muscle are NOT exposed. (as per WOC note on 11/14/20)  Wound Description (Comments): Blister that has drained, 2 cm X 4.5 cm  Present on Admission: No     Pressure Injury 10/16/20 Coccyx Right Stage 2 -  Partial thickness loss of dermis presenting as a shallow open injury with a red, pink wound bed without slough. blister that has drained, 0.5 cm X 0.5 cm (Active)  10/16/20 0800  Location: Coccyx  Location Orientation: Right  Staging: Stage 2 -  Partial thickness loss of dermis presenting as a shallow open injury with a red, pink wound bed without slough.  Wound Description (Comments): blister that has drained, 0.5 cm X 0.5 cm  Present on Admission: No    Physical  Exam: Vital Signs Blood pressure (!) 88/74, pulse 62, temperature 98 F (36.7 C), resp. rate 17, height 5' 10.5" (1.791 m), weight 82.5 kg, SpO2 99 %. Constitutional: No distress . Vital signs reviewed. HEENT: EOMI, oral membranes moist Neck: supple Cardiovascular: RRR without murmur. No JVD    Respiratory/Chest: CTA Bilaterally without wheezes or rales. Normal effort    GI/Abdomen: BS +, non-tender, non-distended Ext: no clubbing, cyanosis, or edema Psych: pleasant and cooperative, sl anxious Skin: tunneled cath right shoulder in place. Right foot intact. Sacral wound  superficial with predominant granulation tissue.  LVAD site clean, dry Neuro: Pt is cognitively appropriate with normal insight and awareness.  STM deficits. Cranial nerves 2-12 are intact. Sensory exam is normal. Reflexes are 2+ in all 4's. Fine motor coordination is intact. R>L UE tremors. Motor function is grossly 4/5 with tremor. RUE  Remains 3-4/5 but still limited d/t pain in shoulder. has scattered sensory loss throughout right arm. LE motor 3-4/5 --no motor changes Musculoskeletal: sensitivity right elbow to an extent  Assessment/Plan: 1. Functional deficits which require 3+ hours per day of interdisciplinary therapy in a comprehensive inpatient rehab setting.  Physiatrist is providing close team supervision and 24 hour management of active medical problems listed below.  Physiatrist and rehab team continue to assess barriers to discharge/monitor patient progress toward functional and medical goals  Care Tool:  Bathing  Bathing activity did not occur:  (time constraints) Body parts bathed by patient: Left arm,Abdomen,Chest,Front perineal area,Right upper leg,Left upper leg,Face,Right arm,Buttocks   Body parts bathed by helper: Right lower leg,Left lower leg     Bathing assist Assist Level: Moderate Assistance - Patient 50 - 74%     Upper Body Dressing/Undressing Upper body dressing Upper body  dressing/undressing activity did not occur (including orthotics): Environmental limitations (PICC running) What is the patient wearing?: Pull over shirt    Upper body assist Assist Level: Minimal Assistance - Patient > 75%    Lower Body Dressing/Undressing Lower body dressing      What is the patient wearing?: Pants     Lower body assist Assist for lower body dressing: Moderate Assistance - Patient 50 - 74%     Toileting Toileting Toileting Activity did not occur Landscape architect and hygiene only): N/A (no void or bm)  Toileting assist Assist for toileting: Dependent - Patient 0%     Transfers Chair/bed transfer  Transfers assist  Chair/bed transfer activity did not occur: Safety/medical concerns  Chair/bed transfer assist level: Contact Guard/Touching assist Chair/bed transfer assistive device: Programmer, multimedia   Ambulation assist   Ambulation activity did not occur: Safety/medical concerns  Assist level: Contact Guard/Touching assist Assistive device: Walker-rolling Max distance: 30   Walk 10 feet activity   Assist  Walk 10 feet activity did not occur: Safety/medical concerns  Assist level: Contact Guard/Touching assist Assistive device: Walker-rolling   Walk 50 feet activity   Assist Walk 50 feet with 2 turns activity did not occur: Safety/medical concerns  Assist level: Contact Guard/Touching assist Assistive device: Walker-rolling    Walk 150 feet activity   Assist Walk 150 feet activity did not occur: Safety/medical concerns         Walk 10 feet on uneven surface  activity   Assist Walk 10 feet on uneven surfaces activity did not occur: Safety/medical concerns         Wheelchair     Assist Will patient use wheelchair at discharge?: No (Per PT long term goals)   Wheelchair activity did not occur: Safety/medical concerns         Wheelchair 50 feet with 2 turns activity    Assist    Wheelchair 50  feet with 2 turns activity did not occur: Safety/medical concerns       Wheelchair 150 feet activity     Assist  Wheelchair 150 feet activity did not occur: Safety/medical concerns       Blood pressure (!) 88/74, pulse 62, temperature 98 F (36.7 C), resp. rate 17, height 5' 10.5" (1.791 m), weight 82.5 kg, SpO2 99 %.  Medical Problem List and Plan: 1.Functional and mobility deficitssecondary to debility after acute on chronic CHF with subsequent LVAD placement -patient maynotshower -ELOS/Goals: ?2 weeks+   -Continue CIR therapies including PT, OT   -pt with STM deficits  -vestibular evaluation inconclusive   - scopolamine patch for symptom relief   2. Chronic systolic CHF s/p LVAD 3:  -DVT/anticoagulation:Pharmaceutical:Continue Coumadin, INR 2.1 on 4/17 -antiplatelet therapy: ASA 3.Right arm neuropathic pain/brachial plexus injury/Pain Management:Tylenol or oxycodone  prn.  --Increased gabapentin to 200 mgat bedtime with some benefit -sling for RUE support and comfort  -expect recovery of motor/sensory function in RUE  4. Mood:LCSW to follow for evaluation and support. -antipsychotic agents: N/A 5. Neuropsych: This patientiscapable of making decisions on hisown behalf. 6. Skin/Wound Care:Optimize nutritional status--continue Nephro and prosource.  --Sacral decubitus: Air mattress.    -appears much improved-->WTD dressing   -pt is NO LONGER RECEIVING HYDRO --Right foot wound resolved.  7. Fluids/Electrolytes/Nutrition:Strict I/O. Continue to monitor labs with HD? 8. Chronic systolic CHF s/p LVAD 3:  --Cardiology to follow and manage cardiac issues.  -daily weights Filed Weights   11/23/20 0500 11/23/20 0517 11/24/20 0500  Weight: 83.6 kg 83.6 kg 82.5 kg    4/13 midodrine adjusted to 5mg  TID daily  4/18 bp/volume mgt per nephro  and cards.    -off revatio   -TEDS, acclimation  4/21 bp's holding, MAP around 80 9. Acute on chronic anemia: Continue to monitor H/H/ monitor for signs of bleeding.  --Was transfused with multiple units PRBC. --ON Nulecit Tu/Th/Sa  -transfuse as needed in HD 8.6 4/18 10. T2DM: Hgb A1c- monitor BS ac/hs.BS reasonable on current intake --continue Lantus 15 units/am with SSI for elevated BS.   4/17 CBG 65-118: decreased lantus to 14U  4/21 controlled today CBG (last 3)  Recent Labs    11/23/20 1646 11/23/20 2119 11/24/20 0559  GLUCAP 152* 108* 86    11. ESRD: Hyperkalemia being managed with lokelma/HD. -On regular diet w/o FR --question due to intake. Phosphorous levels trending up.  --Scheduled HD at the end of the day to help with tolerance of activity during the day. --RD consulted for diet education (multiple restrictions) 12. Mild leucocytosis: Resolved  13. A fib: NSR, HR remains controlled on Amiodarone, continue 14. Nausea:   --triggered by productive cough?---added mucinex with benefit  -needs to have regular bm's.   -on miralax and senna-s bid  -4/21 add hs dose of reglan to help with overnight nausea/peristalsis    LOS: 10 days A FACE TO FACE EVALUATION WAS PERFORMED  Meredith Staggers 11/24/2020, 9:23 AM

## 2020-11-24 NOTE — Progress Notes (Signed)
LVAD Coordinator Rounding Note:  HM III LVAD implanted on 10/13/20 by Dr Cyndia Bent under Destination Therapy criteria.  Discharged from [facility] and admitted to [facility] 11/14/20.  Pt asleep in the bed on my arrival. Pt states that he worked with PT early this morning.  Per OT memory assessment pt found to have mild short term memory deficits.   No family at bedside for dressing change education.   Pt has HD dialysis scheduled on Tues/Thurs/Sat. Tolerating well. Tunneled catheter in place.   Vital signs: Temp: 97.6 HR: 87 Doppler: 92 Auto cuff: 115/92 O2 Sat: 90% on RA Wt: 183.6>190.9>194>185.4>188.6>184.2>181.6 lbs    LVAD interrogation reveals:  Speed: 5200 Flow: 4.4 Power: 3.7w PI: 3.9 Hematocrit: 28  Alarms: none Events: 1 PI today  Fixed speed: 5200 Low speed limit: 4900  Drive Line: Existing VAD dressing removed and site care performed using sterile technique. Drive line exit site cleaned with Chlora prep applicators x 2, acell placed in driveline site along with sterile ky jelly, and gauze dressing placed over the site. Site is much improved from Monday. Exit site healing and unincorporated, the velour is fully implanted at exit site Small amount of serosanguinous drainage. No redness, tenderness, foul odor or rash noted. Drive line anchor re-applied. Continue twice weekly dressing changes using acell. Next dressing change will be due 11/28/20. No family available for dressing change.         Labs:  LDH trend: 199>191>196>180>174>168>182>177  INR trend: 1.5>1.7>1.9>2.4>3.2>2.9>3.2  Anticoagulation Plan: -INR Goal: 2.0-2.5 -ASA Dose: stopped 30 days post op per protocol  Device:  -  [employer]tronic single -  Pacing: VVI 40 - Therapies: on 200 bpm  Plan/Recommendations:  1. Call VAD coordinator for equipment or drive line issues 2. Twice weekly dressing changes while using acell. Next dressing due 11/28/20.  Tanda Rockers RN [address] Coordinator  Office: [phone]  24/7  Pager: [phone]

## 2020-11-24 NOTE — Progress Notes (Signed)
Patient ID: Merrik Puebla., male   DOB: 1960-09-13, 60 y.o.   MRN: [id number]     Advanced Heart Failure Rounding Note  PCP-Cardiologist: No primary care provider on file.   Subjective:    - 2/27 Milrinone switched to dobutamine 5 mcg.  - 2/28 Moved to ICU. Dobutamine increased to 7.5 mcg and Norepi 3 mcg added. Diuretics held.  - 3/1 Impella 5.5 placed.  - 3/4 Teeth removed - 3/10 HM3 LVAD placed - 3/13 VAD speed increased to 5500. [name] out  - 3/14 Nephrology consulted. Given 1UPRBCs  - 3/15 Started CRRT. Given 1UPRBCs.  - 3/23 VAD Speed turned down to 5300  - 3/23 CRRT and Milrinone Stopped. Back in Afib - 3/24 CRRT restarted.  - 3/27 CVVH off.  - 3/28 iHD - 3/29 iHD. Walked 75 feet!! - 3/20 Ramp Echo: Fixed speed: 5200 Low speed limit: 4900 - 4/3 S/P RIJ HD catheter.  - 4/10 60th B-day!  - 4/11 Transferred to CIR. Back on heparin drip.   Slowly improving with PT, went up stairs today and yesterday.  Woke up early am again with nausea/vomiting, currently feels fine.    INR 3.2, No bleeding   LVAD Interrogation HM 3: Speed: 5200 Flow: 4.3 PI: 4.1  Power: 3.8. VAD interrogated personally. Parameters stable.   Objective:   Weight Range: 82.5 kg Body mass index is 25.72 kg/m.   Vital Signs:   Temp:  [98 F (36.7 C)-98.5 F (36.9 C)] 98 F (36.7 C) (04/21 0543) Pulse Rate:  [62-87] 62 (04/21 0543) Resp:  [15-17] 17 (04/21 0543) BP: (81-152)/(60-138) 88/74 (04/21 0543) SpO2:  [97 %-99 %] 99 % (04/21 0543) Weight:  [82.5 kg] 82.5 kg (04/21 0500) Last BM Date: 11/24/20  Weight change: Filed Weights   11/23/20 0500 11/23/20 0517 11/24/20 0500  Weight: 83.6 kg 83.6 kg 82.5 kg    Intake/Output:   Intake/Output Summary (Last 24 hours) at 11/24/2020 1104 Last data filed at 11/24/2020 0700 Gross per 24 hour  Intake 600 ml  Output 300 ml  Net 300 ml      Physical Exam   General: Well appearing this am. NAD.  HEENT: Normal. Neck: Supple, JVP 7-8 cm.  Carotids OK.  Cardiac:  Mechanical heart sounds with LVAD hum present.  Lungs:  CTAB, normal effort.  Abdomen:  NT, ND, no HSM. No bruits or masses. +BS  LVAD exit site: Well-healed and incorporated. Dressing dry and intact. No erythema or drainage. Stabilization device present and accurately applied. Driveline dressing changed daily per sterile technique. Extremities:  Warm and dry. No cyanosis, clubbing, rash, or edema.  Neuro:  Alert & oriented x 3. Cranial nerves grossly intact. Moves all 4 extremities w/o difficulty. Affect pleasant    Labs    CBC Recent Labs    11/23/20 0400 11/24/20 0454  WBC 8.9 9.7  HGB 8.7* 8.6*  HCT 28.8* 28.2*  MCV 89.7 89.0  PLT 151 850   Basic Metabolic Panel Recent Labs    11/23/20 0400 11/24/20 0454  NA 136 137  K 4.4 4.6  CL 96* 99  CO2 27 27  GLUCOSE 75 86  BUN 38* 54*  CREATININE 5.14* 6.66*  CALCIUM 9.3 9.3  MG 2.1 2.3  PHOS 5.2* 6.0*   Liver Function Tests Recent Labs    11/23/20 0400 11/24/20 0454  ALBUMIN 2.7* 2.7*   No results for input(s): LIPASE, AMYLASE in the last 72 hours. Cardiac Enzymes No results for input(s): CKTOTAL, CKMB,  CKMBINDEX, TROPONINI in the last 72 hours.  BNP: BNP (last 3 results) Recent Labs    11/10/20 0150 11/17/20 0411 11/24/20 0454  BNP 905.1* 630.2* 723.8*    ProBNP (last 3 results) No results for input(s): PROBNP in the last 8760 hours.   D-Dimer No results for input(s): DDIMER in the last 72 hours. Hemoglobin A1C No results for input(s): HGBA1C in the last 72 hours. Fasting Lipid Panel No results for input(s): CHOL, HDL, LDLCALC, TRIG, CHOLHDL, LDLDIRECT in the last 72 hours. Thyroid Function Tests No results for input(s): TSH, T4TOTAL, T3FREE, THYROIDAB in the last 72 hours.  Invalid input(s): FREET3  Other results:   Imaging    No results found.   Medications:     Scheduled Medications: . (feeding supplement) PROSource Plus  30 mL Oral BID BM  . amiodarone   200 mg Oral Daily  . vitamin C  1,000 mg Oral Daily  . Chlorhexidine Gluconate Cloth  6 each Topical Q0600  . Chlorhexidine Gluconate Cloth  6 each Topical Q0600  . darbepoetin (ARANESP) injection - DIALYSIS  100 mcg Intravenous Q Tue-HD  . feeding supplement (NEPRO CARB STEADY)  237 mL Oral BID BM  . gabapentin  100 mg Oral QHS  . guaiFENesin  600 mg Oral BID  . insulin aspart  0-15 Units Subcutaneous TID WC  . insulin aspart  0-5 Units Subcutaneous QHS  . insulin glargine  14 Units Subcutaneous Daily  . metoCLOPramide  5 mg Oral TID AC & HS  . midodrine  5 mg Oral TID WC  . multivitamin  1 tablet Oral QHS  . Muscle Rub   Topical BID  . pantoprazole  40 mg Oral Daily  . polyethylene glycol  17 g Oral BID  . rosuvastatin  20 mg Oral Daily  . scopolamine  1 patch Transdermal Q72H  . senna-docusate  2 tablet Oral BID  . sorbitol  30 mL Oral Daily  . Warfarin - Pharmacist Dosing Inpatient   Does not apply q1600    Infusions: . sodium chloride 10 mL (11/17/20 1430)    PRN Medications: sodium chloride, acetaminophen, alteplase, alum & mag hydroxide-simeth, bisacodyl, dextrose, diphenhydrAMINE, guaiFENesin-dextromethorphan, heparin, lidocaine-prilocaine, ondansetron (ZOFRAN) IV, oxyCODONE, pentafluoroprop-tetrafluoroeth, prochlorperazine **OR** prochlorperazine **OR** prochlorperazine, sodium chloride flush, sodium phosphate, traZODone, white petrolatum    Assessment/Plan   1.Acute on chronic systolic HF -> cardiogenic shock:ICM.HasMedtronic ICD. EF 15%. Low output persisted despite milrinone and then dobutamine with rise in creatinine to 2.5. Impella 5.5 placed 3/1.  Creatinine improved to 1.7 and HM3 LVAD was placed on 3/10. Developed post-op renal failure requiring CVVH. Now off milrinone and epinephrine. Last echo showed moderate RV enlargement with mildly decreased RV function and VAD speed decreased to 5300 due to left-sided septum. CRRT stopped 3/23 but restarted 3/24. CVVH  stopped again on 3/27. Ramp echo 3/30 with speed decreased to 5200. Tolerating iHD for volume management.  MAP 80s.   - Continue midodrine to 5 tid, not orthostatic today.   - Continue compression stockings.  - HD for volume management, next today.   2. LVAD: 3/13 VAD speed increased to 5500. 3/23 speed decreased to 5300 and decreased to 5200 on 3/30. VAD interrogated personally. LDH 177, INR 3.2 today  - On warfarin, on ASA 81 daily.  Discussed warfarin dosing with PharmD personally. - DL site ok 3. CAD s/p [id number]:Last cath in 6/19 with patent grafts. - Continue Crestor 40 mg daily.  4. AKI on CKD Stage 3b: Suspect that  this is a combination of cardiorenal syndrome and diabetic nephropathy and possibly ATN. Developed post-op AKI. Started on CRRT 3/15. CVVH stopped 3/23 but restarted 3/24. Now getting iHD, looks like he will be HD-dependent.  Has tunneled catheter.  - HD T/Th/S - Dr Augustin Coupe to talk with [facility]VS, but with continuous flow LVAD, think AV fistula would be less likely to mature.  Will await decision by VVS and nephrology in terms of access.  5. Atrial flutter/fibrillation: s/p DC-CV 08/21/18.  - Continue amio 200 mg daily.   - Remains in NSR 6. Wound R Foot: Partial thickness skin loss noted. Cover with mepilex border and change every 5 days. Resolved.  7. Right arm pain: Likely neuropathic from stretch of brachial plexus with Impella placement.  - Continue gabapentin.  8. ID:  WBCs normal. Afebrile. Blood Cx- NGTD.  9. ID:[id number] of very poor control but hgbA1ctrending down. Most recent 7.7%.  Insulin drip stopped 3/24. Blood glucose controlled on reduced Lantus and  sliding scale insulin to moderate.   - Diabetes coordinators have seen 10. Anemia: Has been stable. hgb 8.6 today.  11. Deconditioning - Continue to work with CIR 12. Unstageable Pressure Ulcer on buttocks: Much improved - Hydrotherapy completed - Continue repositioning 13. NSVT: No longer on the monitor.  14.  Dizziness: Still with symptoms that seem like BPPV at times. Has had Epley maneuvers.  15. Nausea/vomiting: periodically occurring overnight/in the early am. Continue symptomatic treatment.    Loralie Champagne, MD  11:04 AM

## 2020-11-24 NOTE — Progress Notes (Signed)
Round on patient in secondary to tolerance of HD in the chair. Patient found up in his room sitting on the side of the bed eating his lunch speaking with the CSW. Permission granted for this conversation. Patient reports that he is tolerating sitting in the chair well and is agreeable to continue to go in th HD chair. He also voices concern that he will have 21 steps to get up and down when he gets out of CIR. This will have to be done each time he goes to HD so he has his son working on plan for a first level apartment at this time. Patient with no further concerns at this time. Will continue to follow as appropriate.  Dorthey Sawyer, RN  Dialysis Nurse Coordinator [phone]

## 2020-11-25 DIAGNOSIS — R5381 Other malaise: Secondary | ICD-10-CM | POA: Diagnosis not present

## 2020-11-25 DIAGNOSIS — I951 Orthostatic hypotension: Secondary | ICD-10-CM | POA: Diagnosis not present

## 2020-11-25 DIAGNOSIS — I5022 Chronic systolic (congestive) heart failure: Secondary | ICD-10-CM | POA: Diagnosis not present

## 2020-11-25 DIAGNOSIS — Z95811 Presence of heart assist device: Secondary | ICD-10-CM | POA: Diagnosis not present

## 2020-11-25 LAB — RENAL FUNCTION PANEL
Albumin: 2.6 g/dL — ABNORMAL LOW (ref 3.5–5.0)
Anion gap: 10 (ref 5–15)
BUN: 25 mg/dL — ABNORMAL HIGH (ref 6–20)
CO2: 28 mmol/L (ref 22–32)
Calcium: 9.2 mg/dL (ref 8.9–10.3)
Chloride: 97 mmol/L — ABNORMAL LOW (ref 98–111)
Creatinine, Ser: 4.34 mg/dL — ABNORMAL HIGH (ref 0.61–1.24)
GFR, Estimated: 15 mL/min — ABNORMAL LOW (ref >60)
Glucose, Bld: 71 mg/dL (ref 70–99)
Phosphorus: 4.3 mg/dL (ref 2.5–4.6)
Potassium: 4.1 mmol/L (ref 3.5–5.1)
Sodium: 135 mmol/L (ref 135–145)

## 2020-11-25 LAB — MAGNESIUM: Magnesium: 2 mg/dL (ref 1.7–2.4)

## 2020-11-25 LAB — CBC
HCT: 28.7 % — ABNORMAL LOW (ref 39.0–52.0)
Hemoglobin: 8.6 g/dL — ABNORMAL LOW (ref 13.0–17.0)
MCH: 27 pg (ref 26.0–34.0)
MCHC: 30 g/dL (ref 30.0–36.0)
MCV: 90 fL (ref 80.0–100.0)
Platelets: 140 10*3/uL — ABNORMAL LOW (ref 150–400)
RBC: 3.19 MIL/uL — ABNORMAL LOW (ref 4.22–5.81)
RDW: 17 % — ABNORMAL HIGH (ref 11.5–15.5)
WBC: 7.9 10*3/uL (ref 4.0–10.5)
nRBC: 0 % (ref 0.0–0.2)

## 2020-11-25 LAB — PROTIME-INR
INR: 2.9 — ABNORMAL HIGH (ref 0.8–1.2)
Prothrombin Time: 30.7 seconds — ABNORMAL HIGH (ref 11.4–15.2)

## 2020-11-25 LAB — GLUCOSE, CAPILLARY
Glucose-Capillary: 64 mg/dL — ABNORMAL LOW (ref 70–99)
Glucose-Capillary: 75 mg/dL (ref 70–99)
Glucose-Capillary: 108 mg/dL — ABNORMAL HIGH (ref 70–99)
Glucose-Capillary: 150 mg/dL — ABNORMAL HIGH (ref 70–99)
Glucose-Capillary: 91 mg/dL (ref 70–99)
Glucose-Capillary: 94 mg/dL (ref 70–99)

## 2020-11-25 LAB — LACTATE DEHYDROGENASE: LDH: 167 U/L (ref 98–192)

## 2020-11-25 MED ORDER — ROSUVASTATIN CALCIUM 5 MG PO TABS
10.0000 mg | ORAL_TABLET | Freq: Every day | ORAL | Status: DC
  Administered 2020-11-26 – 2020-12-08 (×13): 10 mg via ORAL
  Filled 2020-11-25 (×14): qty 2

## 2020-11-25 MED ORDER — WARFARIN 0.5 MG HALF TABLET
0.5000 mg | ORAL_TABLET | Freq: Once | ORAL | Status: AC
  Administered 2020-11-25: 0.5 mg via ORAL
  Filled 2020-11-25: qty 1

## 2020-11-25 MED ORDER — INSULIN GLARGINE 100 UNIT/ML ~~LOC~~ SOLN
8.0000 [IU] | Freq: Every day | SUBCUTANEOUS | Status: DC
  Administered 2020-11-25 – 2020-12-09 (×14): 8 [IU] via SUBCUTANEOUS
  Filled 2020-11-25 (×17): qty 0.08

## 2020-11-25 MED ORDER — MIDODRINE HCL 5 MG PO TABS
10.0000 mg | ORAL_TABLET | Freq: Three times a day (TID) | ORAL | Status: DC
  Administered 2020-11-25 – 2020-12-09 (×42): 10 mg via ORAL
  Filled 2020-11-25 (×41): qty 2

## 2020-11-25 MED ORDER — CHLORHEXIDINE GLUCONATE CLOTH 2 % EX PADS
6.0000 | MEDICATED_PAD | Freq: Every day | CUTANEOUS | Status: DC
  Administered 2020-11-26 – 2020-11-28 (×3): 6 via TOPICAL

## 2020-11-25 NOTE — Progress Notes (Signed)
Occupational Therapy Session Note  Patient Details  Name: Marcus Johnson. MRN: [id number] Date of Birth: 10/12/1960  Today's Date: 11/25/2020 OT Individual Time: 1304-1400 OT Individual Time Calculation (min): 56 min    Short Term Goals: Week 2:  OT Short Term Goal 1 (Week 2): Pt will complete toilet transfer with supervision OT Short Term Goal 2 (Week 2): Pt will complete LB dressing with CGA OT Short Term Goal 3 (Week 2): Pt will appropriately instruct a caregiver to change LVAD to battery power  Skilled Therapeutic Interventions/Progress Updates:    Pt in bed to start session.  Map checked and it was 86 in supine with HOB up.  Had him work on sitting up from supine position.  Pt with max assist to start with increased posterior lean and right lean.  He was unable to achieve upright sitting on first attempt secondary to resistive lean to the right.  Had him lay back down and scoot further up in the bed.  Re-attempted sitting with min assist to roll to the right and total assist to transition to sitting.  He needed total assist to keep from pulling himself to the right as with the first attempt.  Therapist finally overpowered him and brought him over to the left side to help decrease the pulling to the right.  He then was able to re-adjust and sit at midline with min guard assist.  Increased twitching noted throughout transitions in his RUE and his LEs.  Next, had him complete stand pivot transfer to the wheelchair from the bed with use of the RW for support.  He was able to complete sit to stand with max assist initially, but demonstrated increased posterior lean and inability to step toward the wheelchair.  Had him sit back down and then complete sit to stand again, which was mod assist this time with transition to the wheelchair.  While sitting in the chair had pt work on guiding therapist and helping to switch from wall power with the LVAD to battery power.  He was not able to tell  therapist how to complete task without max instructional cueing.  Therapist assisted with putting pt on battery power and then taking him off before transferring back to the bed secondary to decreased time.  He was able to complete stand pivot transfer from the wheelchair back to the bed with min assist using the RW.  Min assist for supine to sit as well.  Call button and phone in reach at end of session.      Therapy Documentation Precautions:  Precautions Precautions: Fall,Sternal,Other (comment) Precaution Comments: LVAD, sacral wound, Left BPPV Required Braces or Orthoses: Other Brace Other Brace: thumb loop and buddy strap R hand Restrictions Weight Bearing Restrictions: Yes RUE Weight Bearing:  (sternal) Other Position/Activity Restrictions: sternal precautions  Pain: Pain Assessment Pain Scale: Faces Faces Pain Scale: No hurt ADL: See Care Tool Section for some details of mobility and selfcare  Therapy/Group: Individual Therapy  Khup Sapia OTR/L 11/25/2020, 3:53 PM

## 2020-11-25 NOTE — Progress Notes (Signed)
Occupational Therapy Session Note  Patient Details  Name: Marcus Johnson. MRN: [id number] Date of Birth: [birth year]  Today's Date: 11/25/2020 OT Individual Time: 1005-1100 OT Individual Time Calculation (min): 55 min    Short Term Goals: Week 1:  OT Short Term Goal 1 (Week 1): Pt will don pants with min A OT Short Term Goal 1 - Progress (Week 1): Progressing toward goal OT Short Term Goal 2 (Week 1): Pt will tolerate standing at the sink for ADL with no c/o light headness OT Short Term Goal 2 - Progress (Week 1): Met OT Short Term Goal 3 (Week 1): Pt will complete toilet transfer with min A OT Short Term Goal 3 - Progress (Week 1): Met  Skilled Therapeutic Interventions/Progress Updates:    Pt with MAP taken at beginning of session 77 after emesis and at end of session 72  Pt completes sup>sit EOB with MIN A for trunk elevation. Stand pivot transfer throughout with MIN A overall. Pt with emesis episode at w/c at sink. RN aware. Pt provided with IV Zofran. Pt completes grooming seated with Vc for RUE as support limb. Trials of standing at sink to doff pants and pt c/o wooziness and increased post lean. Transfer back to bed. Attempted use of reacher at EOB to practice threading BLE Into pants. Pt unable to thread any leg into pants and unable problem solve reacher despite saying that he used ot to pick up trash before. Trialed foot on stool and on chiar but stillunable to thread LEs. Exited session with pt seated in bed, exit alarm on and call light in reach   Therapy Documentation Precautions:  Precautions Precautions: Fall,Sternal,Other (comment) Precaution Comments: LVAD, sacral wound, Left BPPV Required Braces or Orthoses: Other Brace Other Brace: thumb loop and buddy strap R hand Restrictions Weight Bearing Restrictions: Yes RUE Weight Bearing:  (sternal) Other Position/Activity Restrictions: sternal precautions General:   Vital Signs: Therapy Vitals Temp: 98.7 F  (37.1 C) Temp Source: Oral Pulse Rate: 89 Resp: 20 Patient Position (if appropriate): Lying Oxygen Therapy SpO2: 98 % O2 Device: Room Air Pain:   ADL: ADL Eating: Set up Where Assessed-Eating: Bed level Grooming: Minimal assistance Where Assessed-Grooming: Edge of bed Upper Body Bathing: Unable to assess Lower Body Bathing: Unable to assess Upper Body Dressing: Unable to assess Lower Body Dressing: Maximal assistance Toileting: Unable to assess Toilet Transfer: Unable to assess Tub/Shower Transfer Method: Unable to assess Gaffer Transfer: Unable to assess Vision   Perception    Praxis   Exercises:   Other Treatments:     Therapy/Group: Individual Therapy  Tonny Branch 11/25/2020, 6:41 AM

## 2020-11-25 NOTE — Progress Notes (Signed)
Marcus Johnson  [facility] Progress Note     Assessment/ Plan:   Acute kidney injury on chronic kidney disease stage III: With underlying chronic kidney disease likely from diabetes/hypertension and worsening renal function from hemodynamically mediated ATN and cardiorenal syndrome. CRRT 3/15-3/22, 3/24-27; HD since 3/28. -Unfortunately, no signs of renal recovery currently - s/p [facility] 11/07/20 with IR, appreciate assistance - HD TTS schedule now; tolerating well, dialysis last on 4/19, HD today - Outpt CLIP underway--> renal navigator following;  -Previous provider d/w the patient and his son referring for permanent access placement. Also discuss with Dr. Aundra Dubin and Dr. Donzetta Matters whether in the subacute setting with a LVAD (placed 3/10) whether the timing is reasonable. Eventually would like to have a permanent acces in the arm given the risk of infection with a tunneled catheter. Would prefer graft unless vein caliber is excellent   Acute exacerbation of chronic systolic heart failure with cardiogenic shock: LVAD, placed 3/10.  Volume status/UF managed with RRT.   Per above UF to euvolemia.  midodrine and warfarin, off Revatio d/t orthostasis   Anemia: Stable.. Likely secondary to chronic disease with CKD contributing.  Transfuse PRBC as needed.  Iron deficient, has been repleted IV.  Continue Aranesp 100 MCG weekly on Tuesday  Renal osteodystrophy:  Phosphorous 4.3  Atrial fibrillation: per cardiology, on amiodarone   Protein calorie malnutrition, hypoalbuminemia: push protein, per primary   DM II: per primary  Dispo: patient able to do HD in chair; working on setting up Outpatient dialysis  Subjective:   Patient in good spirits this morning.  States that he slept during HD yesterday.  He is tolerating the chair.   Objective:   BP 94/63 (BP Location: Left Arm)   Pulse 89   Temp 98.7 F (37.1 C) (Oral)   Resp 20   Ht 5' 10.5" (1.791 m)   Wt 80 kg   SpO2 98%   BMI 24.95 kg/m    Intake/Output Summary (Last 24 hours) at 11/25/2020 1130 Last data filed at 11/25/2020 0803 Gross per 24 hour  Intake 560 ml  Output 2300 ml  Net -1740 ml   Weight change: -0.064 kg  Physical Exam:  General: Elderly male sitting upright in chair, no acute distress Cardiovascular: Regular rate, LVAD hum, RUE dialysis catheter Respiratory: No increased work of breathing, clear lungs bilaterally Abdomen: Soft, nontender, nondistended bowel sounds active Musculoskeletal: No lower extremity edema Skin: Warm, dry    Imaging: No results found.  Labs: BMET Recent Labs  Lab 11/19/20 0435 11/20/20 0418 11/21/20 0508 11/22/20 0449 11/23/20 0400 11/24/20 0454 11/25/20 0445  NA 137 135 135 134* 136 137 135  K 4.5 3.9 4.0 4.9 4.4 4.6 4.1  CL 97* 96* 95* 95* 96* 99 97*  CO2 27 30 28 27 27 27 28   GLUCOSE 119* 86 79 96 75 86 71  BUN 48* 29* 43* 60* 38* 54* 25*  CREATININE 5.88* 4.08* 5.52* 6.86* 5.14* 6.66* 4.34*  CALCIUM 9.4 9.0 9.3 9.0 9.3 9.3 9.2  PHOS 5.6* 4.2 5.7* 6.8* 5.2* 6.0* 4.3   CBC Recent Labs  Lab 11/22/20 0449 11/23/20 0400 11/24/20 0454 11/25/20 0445  WBC 9.5 8.9 9.7 7.9  HGB 8.5* 8.7* 8.6* 8.6*  HCT 27.6* 28.8* 28.2* 28.7*  MCV 89.0 89.7 89.0 90.0  PLT 174 151 172 140*    Medications:    . (feeding supplement) PROSource Plus  30 mL Oral BID BM  . amiodarone  200 mg Oral Daily  .  vitamin C  1,000 mg Oral Daily  . Chlorhexidine Gluconate Cloth  6 each Topical Q0600  . Chlorhexidine Gluconate Cloth  6 each Topical Q0600  . Chlorhexidine Gluconate Cloth  6 each Topical Q0600  . darbepoetin (ARANESP) injection - DIALYSIS  100 mcg Intravenous Q Tue-HD  . feeding supplement (NEPRO CARB STEADY)  237 mL Oral BID BM  . gabapentin  100 mg Oral QHS  . guaiFENesin  600 mg Oral BID  . insulin aspart  0-15 Units Subcutaneous TID WC  . insulin aspart  0-5 Units Subcutaneous QHS  . insulin glargine  8 Units Subcutaneous Daily  . metoCLOPramide  5 mg Oral TID  AC & HS  . midodrine  10 mg Oral TID WC  . multivitamin  1 tablet Oral QHS  . Muscle Rub   Topical BID  . pantoprazole  40 mg Oral Daily  . polyethylene glycol  17 g Oral BID  . [START ON 11/26/2020] rosuvastatin  10 mg Oral Daily  . scopolamine  1 patch Transdermal Q72H  . senna-docusate  2 tablet Oral BID  . sorbitol  30 mL Oral Daily  . warfarin  0.5 mg Oral ONCE-1600  . Warfarin - Pharmacist Dosing Inpatient   Does not apply [place], DO  11/25/2020, 11:30 AM

## 2020-11-25 NOTE — Progress Notes (Signed)
LVAD Coordinator Rounding Note:  HM III LVAD implanted on 10/13/20 by Dr Cyndia Bent under Destination Therapy criteria.  Discharged from [facility] and admitted to [facility] 11/14/20.  Pt asleep in the bed on my arrival. Pt states he is tired after PT this morning. Emesis x 1 this morning; nausea currently resolved.   Per OT memory assessment pt found to have mild short term memory deficits.   No family at bedside for dressing change education.   Pt has HD dialysis scheduled on Tues/Thurs/Sat. Tolerating well. Tunneled catheter in place.   Vital signs: Temp: 98.7 HR: 89 Doppler: 76 Auto cuff: not done O2 Sat: 90% on RA Wt: 183.6>190.9>194>185.4>188.6>184.2>181.6>176.3 lbs    LVAD interrogation reveals:  Speed: 5200 Flow: 4.4  Power: 3.7w PI: 3.9 Hematocrit: 28  Alarms: none Events: 1 PI today  Fixed speed: 5200 Low speed limit: 4900  Drive Line: Existing VAD dressing clean, dry, intact. Drive line anchor re-applied. Continue twice weekly dressing changes using acell. Next dressing change will be due 11/28/20. No family available for dressing change.   Labs:  LDH trend: 199>191>196>180>174>168>182>177>287  INR trend: 1.5>1.7>1.9>2.4>3.2>2.9>3.2>2.9  Anticoagulation Plan: -INR Goal: 2.0-2.5 -ASA Dose: stopped 30 days post op per protocol  Device:  -  Medtronic single -  Pacing: VVI 40 - Therapies: on 200 bpm  Plan/Recommendations:  1. Call VAD coordinator for equipment or drive line issues 2. Twice weekly dressing changes while using acell. Next dressing due 11/28/20.  Emerson Monte RN [place] Coordinator  Office: [phone]  24/7 Pager: [phone]

## 2020-11-25 NOTE — Progress Notes (Signed)
PROGRESS NOTE   Subjective/Complaints: Nausea better. Feels a little woozy with therapy while sitting in chair. Standing map was 82 though.   ROS: Patient denies fever, rash, sore throat, blurred vision,  vomiting, diarrhea, cough, shortness of breath or chest pain, joint or back pain, headache, or mood change.    Objective:   No results found. Recent Labs    11/24/20 0454 11/25/20 0445  WBC 9.7 7.9  HGB 8.6* 8.6*  HCT 28.2* 28.7*  PLT 172 140*   Recent Labs    11/24/20 0454 11/25/20 0445  NA 137 135  K 4.6 4.1  CL 99 97*  CO2 27 28  GLUCOSE 86 71  BUN 54* 25*  CREATININE 6.66* 4.34*  CALCIUM 9.3 9.2    Intake/Output Summary (Last 24 hours) at 11/25/2020 0956 Last data filed at 11/25/2020 0803 Gross per 24 hour  Intake 560 ml  Output 2300 ml  Net -1740 ml     Pressure Injury 10/16/20 Coccyx Mid Stage 3 -  Full thickness tissue loss. Subcutaneous fat may be visible but bone, tendon or muscle are NOT exposed. Blister that has drained, 2 cm X 4.5 cm (Active)  10/16/20 0800  Location: Coccyx  Location Orientation: Mid  Staging: Stage 3 -  Full thickness tissue loss. Subcutaneous fat may be visible but bone, tendon or muscle are NOT exposed. (as per WOC note on 11/14/20)  Wound Description (Comments): Blister that has drained, 2 cm X 4.5 cm  Present on Admission: No     Pressure Injury 10/16/20 Coccyx Right Stage 2 -  Partial thickness loss of dermis presenting as a shallow open injury with a red, pink wound bed without slough. blister that has drained, 0.5 cm X 0.5 cm (Active)  10/16/20 0800  Location: Coccyx  Location Orientation: Right  Staging: Stage 2 -  Partial thickness loss of dermis presenting as a shallow open injury with a red, pink wound bed without slough.  Wound Description (Comments): blister that has drained, 0.5 cm X 0.5 cm  Present on Admission: No    Physical Exam: Vital Signs Blood  pressure 94/63, pulse 89, temperature 98.7 F (37.1 C), temperature source Oral, resp. rate 20, height 5' 10.5" (1.791 m), weight 80 kg, SpO2 98 %. Constitutional: No distress . Vital signs reviewed. HEENT: EOMI, oral membranes moist Neck: supple Cardiovascular: RRR without murmur. No JVD    Respiratory/Chest: CTA Bilaterally without wheezes or rales. Normal effort    GI/Abdomen: BS +, non-tender, non-distended Ext: no clubbing, cyanosis, or edema Psych: pleasant and cooperative, a little anxious Skin: tunneled cath right shoulder in place. Right foot intact. Sacral wound  superficial with predominant granulation tissue.  LVAD site clean, dry Neuro: Pt is cognitively appropriate with normal insight and awareness.  STM deficits. Cranial nerves 2-12 are intact. Sensory exam is normal. Reflexes are 2+ in all 4's. Fine motor coordination is intact. R>L UE tremors are ongoing today. Motor function is grossly 4/5 with tremor. RUE  Remains 3-4/5 but still limited d/t pain in shoulder. has scattered sensory loss throughout right arm. LE motor 3+ to 4/5 Musculoskeletal: sensitivity right elbow to an extent    Assessment/Plan: 1.  Functional deficits which require 3+ hours per day of interdisciplinary therapy in a comprehensive inpatient rehab setting.  Physiatrist is providing close team supervision and 24 hour management of active medical problems listed below.  Physiatrist and rehab team continue to assess barriers to discharge/monitor patient progress toward functional and medical goals  Care Tool:  Bathing  Bathing activity did not occur:  (time constraints) Body parts bathed by patient: Left arm,Abdomen,Chest,Front perineal area,Right upper leg,Left upper leg,Face,Right arm,Buttocks   Body parts bathed by helper: Right lower leg,Left lower leg     Bathing assist Assist Level: Moderate Assistance - Patient 50 - 74%     Upper Body Dressing/Undressing Upper body dressing Upper body  dressing/undressing activity did not occur (including orthotics): Environmental limitations (PICC running) What is the patient wearing?: Pull over shirt    Upper body assist Assist Level: Minimal Assistance - Patient > 75%    Lower Body Dressing/Undressing Lower body dressing      What is the patient wearing?: Pants     Lower body assist Assist for lower body dressing: Moderate Assistance - Patient 50 - 74%     Toileting Toileting Toileting Activity did not occur Landscape architect and hygiene only): N/A (no void or bm)  Toileting assist Assist for toileting: Dependent - Patient 0%     Transfers Chair/bed transfer  Transfers assist  Chair/bed transfer activity did not occur: Safety/medical concerns  Chair/bed transfer assist level: Contact Guard/Touching assist Chair/bed transfer assistive device: Programmer, multimedia   Ambulation assist   Ambulation activity did not occur: Safety/medical concerns  Assist level: Contact Guard/Touching assist Assistive device: Walker-rolling Max distance: 30   Walk 10 feet activity   Assist  Walk 10 feet activity did not occur: Safety/medical concerns  Assist level: Contact Guard/Touching assist Assistive device: Walker-rolling   Walk 50 feet activity   Assist Walk 50 feet with 2 turns activity did not occur: Safety/medical concerns  Assist level: Contact Guard/Touching assist Assistive device: Walker-rolling    Walk 150 feet activity   Assist Walk 150 feet activity did not occur: Safety/medical concerns         Walk 10 feet on uneven surface  activity   Assist Walk 10 feet on uneven surfaces activity did not occur: Safety/medical concerns         Wheelchair     Assist Will patient use wheelchair at discharge?: No (Per PT long term goals)   Wheelchair activity did not occur: Safety/medical concerns         Wheelchair 50 feet with 2 turns activity    Assist    Wheelchair 50  feet with 2 turns activity did not occur: Safety/medical concerns       Wheelchair 150 feet activity     Assist  Wheelchair 150 feet activity did not occur: Safety/medical concerns       Blood pressure 94/63, pulse 89, temperature 98.7 F (37.1 C), temperature source Oral, resp. rate 20, height 5' 10.5" (1.791 m), weight 80 kg, SpO2 98 %.  Medical Problem List and Plan: 1.Functional and mobility deficitssecondary to debility after acute on chronic CHF with subsequent LVAD placement -patient maynotshower -ELOS/Goals: 5/2  -Continue CIR therapies including PT, OT   -pt with STM deficits  - scopolamine patch for ?vestibular symptom relief    -has 3 flights of stairs to enter home 2. Chronic systolic CHF s/p LVAD 3:  -DVT/anticoagulation:Pharmaceutical:Continue Coumadin, INR 2.1 on 4/17 -antiplatelet therapy: ASA 3.Right arm neuropathic pain/brachial plexus injury/Pain  Management:Tylenol or oxycodone prn.  -- gabapentin 200 mgat bedtime with some benefit -sling prn for RUE support and comfort  -expect recovery of motor/sensory function in RUE  4. Mood:LCSW to follow for evaluation and support. -antipsychotic agents: N/A 5. Neuropsych: This patientiscapable of making decisions on hisown behalf. 6. Skin/Wound Care:Optimize nutritional status--continue Nephro and prosource.  --Sacral decubitus: Air mattress.    -appears much improved-->continue WTD dressing   -pt is NO LONGER RECEIVING HYDRO --Right foot wound resolved.  7. Fluids/Electrolytes/Nutrition:Strict I/O. Continue to monitor labs with HD? 8. Chronic systolic CHF s/p LVAD 3:  --Cardiology to follow and manage cardiac issues.  -daily weights Filed Weights   11/24/20 0500 11/24/20 1400 11/24/20 1740  Weight: 82.5 kg 82.4 kg 80 kg    4/13 midodrine adjusted to 5mg  TID  daily  4/18 bp/volume mgt per nephro and cards.    -off revatio   -TEDS, acclimation  4/22 bp's holding, MAP 82 this morning 9. Acute on chronic anemia: Continue to monitor H/H/ monitor for signs of bleeding.  --Was transfused with multiple units PRBC. --ON Nulecit Tu/Th/Sa  -transfuse as needed in HD 8.6 4/18 10. T2DM: Hgb A1c- monitor BS ac/hs.BS reasonable on current intake --continue Lantus in am with SSI for elevated BS.   4/22 sugars low, not eating consistently. Reduce lantus to 8u qam CBG (last 3)  Recent Labs    11/24/20 2106 11/25/20 0605 11/25/20 0631  GLUCAP 110* 64* 75    11. ESRD: Hyperkalemia being managed with lokelma/HD. -Scheduled HD at the end of the day to help with tolerance of activity during the day. --RD following 12. Mild leucocytosis: Resolved  13. A fib: NSR, HR remains controlled on Amiodarone, continue 14. Nausea:   --triggered by productive cough?---added mucinex with benefit  -needs to have regular bm's.   -on miralax and senna-s bid  -4/22  hs dose of reglan helped at least last night with overnight nausea/peristalsis    LOS: 11 days A FACE TO FACE EVALUATION WAS PERFORMED  Meredith Staggers 11/25/2020, 9:56 AM

## 2020-11-25 NOTE — Progress Notes (Addendum)
Patient ID: Marcus Johnson., male   DOB: June 19, 1961, 60 y.o.   MRN: [id number]     Advanced Heart Failure Rounding Note  PCP-Cardiologist: No primary care provider on file.   Subjective:    - 2/27 Milrinone switched to dobutamine 5 mcg.  - 2/28 Moved to ICU. Dobutamine increased to 7.5 mcg and Norepi 3 mcg added. Diuretics held.  - 3/1 Impella 5.5 placed.  - 3/4 Teeth removed - 3/10 HM3 LVAD placed - 3/13 VAD speed increased to 5500. [name] out  - 3/14 Nephrology consulted. Given 1UPRBCs  - 3/15 Started CRRT. Given 1UPRBCs.  - 3/23 VAD Speed turned down to 5300  - 3/23 CRRT and Milrinone Stopped. Back in Afib - 3/24 CRRT restarted.  - 3/27 CVVH off.  - 3/28 iHD - 3/29 iHD. Walked 75 feet!! - 3/20 Ramp Echo: Fixed speed: 5200 Low speed limit: 4900 - 4/3 S/P RIJ HD catheter.  - 4/10 60th B-day!  - 4/11 Transferred to CIR. Back on heparin drip.    INR 2.9  No bleeding   Had some dizziness earlier today. Midodrine increased back to 10 mg tid.   LVAD Interrogation HM 3: Speed: 5200 Flow: 5 PI: 2.4   Power: 4. VAD interrogated personally. 1 PI event. Parameters stable.   Objective:   Weight Range: 80 kg Body mass index is 24.95 kg/m.   Vital Signs:   Temp:  [98.3 F (36.8 C)-98.8 F (37.1 C)] 98.5 F (36.9 C) (04/22 1403) Pulse Rate:  [50-94] 60 (04/22 1403) Resp:  [17-20] 18 (04/22 1403) BP: (87-116)/(32-77) 94/63 (04/21 1950) SpO2:  [94 %-98 %] 96 % (04/22 1403) Weight:  [80 kg] 80 kg (04/21 1740) Last BM Date: 11/24/20  Weight change: Filed Weights   11/24/20 0500 11/24/20 1400 11/24/20 1740  Weight: 82.5 kg 82.4 kg 80 kg    Intake/Output:   Intake/Output Summary (Last 24 hours) at 11/25/2020 1449 Last data filed at 11/25/2020 1300 Gross per 24 hour  Intake 560 ml  Output 2300 ml  Net -1740 ml      Physical Exam  Physical Exam: GENERAL: No acute distress. In bed.  HEENT: normal  NECK: Supple, JVP does not appear elevated.  .  2+ bilaterally,  no bruits.  No lymphadenopathy or thyromegaly appreciated.   CARDIAC:  Mechanical heart sounds with LVAD hum present.  LUNGS:  Clear to auscultation bilaterally.  ABDOMEN:  Soft, round, nontender, positive bowel sounds x4.     LVAD exit site:   Dressing dry and intact.  No erythema or drainage.  Stabilization device present and accurately applied.  Driveline dressing is being changed daily per sterile technique. EXTREMITIES:  Warm and dry, no cyanosis, clubbing, rash or edema  NEUROLOGIC:  Alert and oriented x 3.    No aphasia.  No dysarthria.  Affect pleasant.  SKIN: Full thickness pressure ulcer- assess with nursing staff. 100% granulation      Labs    CBC Recent Labs    11/24/20 0454 11/25/20 0445  WBC 9.7 7.9  HGB 8.6* 8.6*  HCT 28.2* 28.7*  MCV 89.0 90.0  PLT 172 478*   Basic Metabolic Panel Recent Labs    11/24/20 0454 11/25/20 0445  NA 137 135  K 4.6 4.1  CL 99 97*  CO2 27 28  GLUCOSE 86 71  BUN 54* 25*  CREATININE 6.66* 4.34*  CALCIUM 9.3 9.2  MG 2.3 2.0  PHOS 6.0* 4.3   Liver Function Tests Recent Labs  11/24/20 0454 11/25/20 0445  ALBUMIN 2.7* 2.6*   No results for input(s): LIPASE, AMYLASE in the last 72 hours. Cardiac Enzymes No results for input(s): CKTOTAL, CKMB, CKMBINDEX, TROPONINI in the last 72 hours.  BNP: BNP (last 3 results) Recent Labs    11/10/20 0150 11/17/20 0411 11/24/20 0454  BNP 905.1* 630.2* 723.8*    ProBNP (last 3 results) No results for input(s): PROBNP in the last 8760 hours.   D-Dimer No results for input(s): DDIMER in the last 72 hours. Hemoglobin A1C No results for input(s): HGBA1C in the last 72 hours. Fasting Lipid Panel No results for input(s): CHOL, HDL, LDLCALC, TRIG, CHOLHDL, LDLDIRECT in the last 72 hours. Thyroid Function Tests No results for input(s): TSH, T4TOTAL, T3FREE, THYROIDAB in the last 72 hours.  Invalid input(s): FREET3  Other results:   Imaging    No results  found.   Medications:     Scheduled Medications: . (feeding supplement) PROSource Plus  30 mL Oral BID BM  . amiodarone  200 mg Oral Daily  . vitamin C  1,000 mg Oral Daily  . Chlorhexidine Gluconate Cloth  6 each Topical Q0600  . Chlorhexidine Gluconate Cloth  6 each Topical Q0600  . Chlorhexidine Gluconate Cloth  6 each Topical Q0600  . darbepoetin (ARANESP) injection - DIALYSIS  100 mcg Intravenous Q Tue-HD  . feeding supplement (NEPRO CARB STEADY)  237 mL Oral BID BM  . gabapentin  100 mg Oral QHS  . guaiFENesin  600 mg Oral BID  . insulin aspart  0-15 Units Subcutaneous TID WC  . insulin aspart  0-5 Units Subcutaneous QHS  . insulin glargine  8 Units Subcutaneous Daily  . metoCLOPramide  5 mg Oral TID AC & HS  . midodrine  10 mg Oral TID WC  . multivitamin  1 tablet Oral QHS  . Muscle Rub   Topical BID  . pantoprazole  40 mg Oral Daily  . polyethylene glycol  17 g Oral BID  . [START ON 11/26/2020] rosuvastatin  10 mg Oral Daily  . scopolamine  1 patch Transdermal Q72H  . senna-docusate  2 tablet Oral BID  . sorbitol  30 mL Oral Daily  . warfarin  0.5 mg Oral ONCE-1600  . Warfarin - Pharmacist Dosing Inpatient   Does not apply q1600    Infusions: . sodium chloride 10 mL (11/17/20 1430)    PRN Medications: sodium chloride, acetaminophen, alteplase, alum & mag hydroxide-simeth, bisacodyl, dextrose, diphenhydrAMINE, guaiFENesin-dextromethorphan, heparin, lidocaine-prilocaine, ondansetron (ZOFRAN) IV, oxyCODONE, pentafluoroprop-tetrafluoroeth, prochlorperazine **OR** prochlorperazine **OR** prochlorperazine, sodium chloride flush, sodium phosphate, traZODone, white petrolatum    Assessment/Plan   1.Acute on chronic systolic HF -> cardiogenic shock:ICM.HasMedtronic ICD. EF 15%. Low output persisted despite milrinone and then dobutamine with rise in creatinine to 2.5. Impella 5.5 placed 3/1.  Creatinine improved to 1.7 and HM3 LVAD was placed on 3/10. Developed post-op  renal failure requiring CVVH. Now off milrinone and epinephrine. Last echo showed moderate RV enlargement with mildly decreased RV function and VAD speed decreased to 5300 due to left-sided septum. CRRT stopped 3/23 but restarted 3/24. CVVH stopped again on 3/27. Ramp echo 3/30 with speed decreased to 5200. Tolerating iHD for volume management.  MAP 80-90- Midodrine increased to 10 mg tid due to dizzinness/orthostatic.  - Continue compression stockings.  - HD for volume management 2. LVAD: 3/13 VAD speed increased to 5500. 3/23 speed decreased to 5300 and decreased to 5200 on 3/30. VAD interrogated personally. LDH 177, INR 3.2 today  -  On warfarin, on ASA 81 daily.  Discussed warfarin dosing with PharmD personally. - DL site ok 3. CAD s/p CABG2002:Last cath in 6/19 with patent grafts. - Continue Crestor 40 mg daily.  4. AKI on CKD Stage 3b: Suspect that this is a combination of cardiorenal syndrome and diabetic nephropathy and possibly ATN. Developed post-op AKI. Started on CRRT 3/15. CVVH stopped 3/23 but restarted 3/24. Now getting iHD, looks like he will be HD-dependent.  Has tunneled catheter.  - HD T/Th/S - Dr Augustin Coupe to talk with [facility], but with continuous flow LVAD, think AV fistula would be less likely to mature.  Will await decision by [facility] and nephrology in terms of access.  5. Atrial flutter/fibrillation: s/p DC-CV 08/21/18.  - Continue amio 200 mg daily.   6. Wound R Foot: Partial thickness skin loss noted. Cover with mepilex border and change every 5 days. Resolved.  7. Right arm pain: Likely neuropathic from stretch of brachial plexus with Impella placement.  - Continue gabapentin.  8. ID:  WBCs normal. Afebrile. Blood Cx- NGTD.  9. OH:FGBMSXJ of very poor control but hgbA1ctrending down. Most recent 7.7%.  Insulin drip stopped 3/24. Blood glucose controlled on reduced Lantus and  sliding scale insulin to moderate.   - Diabetes coordinators have seen 10. Anemia: Has been stable. hgb 8.6  today.  11. Deconditioning - Continue to work with CIR 12. Unstageable Pressure Ulcer on buttocks: Much improved 100% granulation.  - Hydrotherapy completed - Continue repositioning 13. NSVT: No longer on the monitor.  14. Dizziness: Still with symptoms that seem like BPPV at times. Has had Epley maneuvers.  15. Nausea/vomiting: periodically occurring overnight/in the early am. Continue symptomatic treatment.   Marcus Grinder, Marcus Johnson  2:49 PM  Agree with the above note.   Had some dizziness this morning with standing, increasing midodrine to 10 mg tid.  Needs to continue aggressive PT work.    LVAD parameters stable.   Marcus Johnson 11/25/2020

## 2020-11-25 NOTE — Progress Notes (Signed)
Physical Therapy Session Note  Patient Details  Name: Marcus Johnson. MRN: [id number] Date of Birth: [birth year]  Today's Date: 11/25/2020 PT Individual Time: 0800-0857 PT Individual Time Calculation (min): 57 min   Short Term Goals: Week 1:  PT Short Term Goal 1 (Week 1): Patient will perform bed mobility with min A consistently. PT Short Term Goal 1 - Progress (Week 1): Met PT Short Term Goal 2 (Week 1): Patient will perform basic transfers with CGA using LRAD. PT Short Term Goal 2 - Progress (Week 1): Progressing toward goal PT Short Term Goal 3 (Week 1): Patient will ambulate >50 ft without symptoms using LRAD. PT Short Term Goal 3 - Progress (Week 1): Partly met (met x1, but not consistent) PT Short Term Goal 4 (Week 1): Patient will tolerate sitting OOB >1 hour. PT Short Term Goal 4 - Progress (Week 1): Met PT Short Term Goal 5 (Week 1): Patient will initate stair training. PT Short Term Goal 5 - Progress (Week 1): Progressing toward goal Week 2:  PT Short Term Goal 1 (Week 2): Patient will perform basic transfers with CGA using LRAD consistently. PT Short Term Goal 2 (Week 2): Patient will consistently ambulate >50 ft using LRAD with CGA. PT Short Term Goal 3 (Week 2): Patient will initiate stair training.  Skilled Therapeutic Interventions/Progress Updates:   Received pt supine in bed, pt agreeable to therapy, and reported pain 3/10 in buttocks from laying in bed increasing to 6/10 pain when sitting up in WC. Session with focus on functional mobility/transfers, generalized strengthening, dynamic standing balance/coordination, and improved activity tolerance. Pt transferred supine<>sitting EOB with HOB elevated and use of bedrails with supervision. Sat EOB and talked PT through steps to switch over his LVAD lines from the monitor to the battery packs. Attempted to stand from EOB, however pt with increased difficulty standing from elevated bed due to feet "slipping" forwards and  reporting "weakness in legs". Transferred stand<>pivot bed<>WC without AD and mod A with therapist in front with cues to reach back for WC armrests prior to sitting. Pt reported feeling "woozy" upon sitting up. MD present for morning rounds and requesting Doppler, recommending to only perform light exercises until Doppler completed. RN notified and pt performed the following exercises sitting in WC with supervision and verbal cues for technique while waiting for RN: -hip flexion 2x15 -LAQ 2x15 -hip adduction towel squeezes 2x10 Therapist assisted RN in obtaining MAP pressure of 82. Kidney DO also present during session. Pt transferred sit<>stand with RW and min A and reported not feeling well and requested to return to bed. Stand<>pivot WC<>bed with RW and min A. Upon sitting EOB pt with multiple posterior leans and requesting to lay down. Sit<>supine with mod A. Reconnected LVAD wires to monitor and charged battery packs. Concluded session with pt supine in bed, needs within reach, and RN present at bedside attending to care.   Therapy Documentation Precautions:  Precautions Precautions: Fall,Sternal,Other (comment) Precaution Comments: LVAD, sacral wound, Left BPPV Required Braces or Orthoses: Other Brace Other Brace: thumb loop and buddy strap R hand Restrictions Weight Bearing Restrictions: Yes RUE Weight Bearing:  (sternal) Other Position/Activity Restrictions: sternal precautions  Therapy/Group: Individual Therapy Alfonse Alpers PT, DPT   11/25/2020, 7:17 AM

## 2020-11-25 NOTE — Progress Notes (Signed)
ANTICOAGULATION CONSULT NOTE - Follow Up Consult  Pharmacy Consult for Warfarin Indication: LVAD  Allergies  Allergen Reactions  . Meclizine Hcl Anaphylaxis and Swelling  . Ivabradine Nausea Only    Patient Measurements: Height: 5' 10.5" (179.1 cm) Weight:  (pt unable to stand for weight) IBW/kg (Calculated) : 74.15 kg  Vital Signs: Temp: 98.7 F (37.1 C) (04/22 0457) Temp Source: Oral (04/22 0457) Pulse Rate: 89 (04/22 0457)  Labs: Recent Labs    11/23/20 0400 11/24/20 0454 11/25/20 0445  HGB 8.7* 8.6* 8.6*  HCT 28.8* 28.2* 28.7*  PLT 151 172 140*  LABPROT 30.4* 32.8* 30.7*  INR 2.9* 3.2* 2.9*  CREATININE 5.14* 6.66* 4.34*    Estimated Creatinine Clearance: 19 mL/min (A) (by C-G formula based on SCr of 4.34 mg/dL (H)).   Assessment: 60 yo male s/p new LVAD implant with HeartMate III on 3/10. Warfarin started per MD on 3/12, and pharmacy has now been consulted to take over warfarin dosing.   INR was trending back down today to 2.9 after low dose overnight. The patient has been very sensitive to small warfarin dose changes and dose was held last night. The patient's diet has remained stable.. No bleeding issues noted.   Goal of Therapy: INR 2-2.5 Monitor platelets by anticoagulation protocol: Yes   Plan:  Warfarin 0.5mg  tonight Daily PT/INR  Monitor for signs and symptoms of bleeding  Erin Hearing PharmD., BCPS Clinical Pharmacist 11/25/2020 11:07 AM

## 2020-11-26 LAB — MAGNESIUM: Magnesium: 2.1 mg/dL (ref 1.7–2.4)

## 2020-11-26 LAB — CBC
HCT: 29.2 % — ABNORMAL LOW (ref 39.0–52.0)
Hemoglobin: 8.6 g/dL — ABNORMAL LOW (ref 13.0–17.0)
MCH: 26.5 pg (ref 26.0–34.0)
MCHC: 29.5 g/dL — ABNORMAL LOW (ref 30.0–36.0)
MCV: 90.1 fL (ref 80.0–100.0)
Platelets: 171 10*3/uL (ref 150–400)
RBC: 3.24 MIL/uL — ABNORMAL LOW (ref 4.22–5.81)
RDW: 17 % — ABNORMAL HIGH (ref 11.5–15.5)
WBC: 8.9 10*3/uL (ref 4.0–10.5)
nRBC: 0 % (ref 0.0–0.2)

## 2020-11-26 LAB — GLUCOSE, CAPILLARY
Glucose-Capillary: 84 mg/dL (ref 70–99)
Glucose-Capillary: 129 mg/dL — ABNORMAL HIGH (ref 70–99)
Glucose-Capillary: 134 mg/dL — ABNORMAL HIGH (ref 70–99)
Glucose-Capillary: 175 mg/dL — ABNORMAL HIGH (ref 70–99)

## 2020-11-26 LAB — RENAL FUNCTION PANEL
Albumin: 2.7 g/dL — ABNORMAL LOW (ref 3.5–5.0)
Anion gap: 11 (ref 5–15)
BUN: 41 mg/dL — ABNORMAL HIGH (ref 6–20)
CO2: 27 mmol/L (ref 22–32)
Calcium: 9.2 mg/dL (ref 8.9–10.3)
Chloride: 98 mmol/L (ref 98–111)
Creatinine, Ser: 5.93 mg/dL — ABNORMAL HIGH (ref 0.61–1.24)
GFR, Estimated: 10 mL/min — ABNORMAL LOW (ref >60)
Glucose, Bld: 97 mg/dL (ref 70–99)
Phosphorus: 5.3 mg/dL — ABNORMAL HIGH (ref 2.5–4.6)
Potassium: 4.2 mmol/L (ref 3.5–5.1)
Sodium: 136 mmol/L (ref 135–145)

## 2020-11-26 LAB — LACTATE DEHYDROGENASE: LDH: 172 U/L (ref 98–192)

## 2020-11-26 LAB — PROTIME-INR
INR: 2.8 — ABNORMAL HIGH (ref 0.8–1.2)
Prothrombin Time: 29.7 seconds — ABNORMAL HIGH (ref 11.4–15.2)

## 2020-11-26 MED ORDER — HEPARIN SODIUM (PORCINE) 1000 UNIT/ML DIALYSIS: 1000.0000 [IU] | INTRAMUSCULAR | Status: DC | PRN

## 2020-11-26 MED ORDER — LIDOCAINE HCL (PF) 1 % IJ SOLN: 5.0000 mL | INTRAMUSCULAR | Status: DC | PRN

## 2020-11-26 MED ORDER — SODIUM CHLORIDE 0.9 % IV SOLN: 100.0000 mL | INTRAVENOUS | Status: DC | PRN

## 2020-11-26 MED ORDER — PENTAFLUOROPROP-TETRAFLUOROETH EX AERO: 1.0000 "application " | INHALATION_SPRAY | CUTANEOUS | Status: DC | PRN

## 2020-11-26 MED ORDER — HEPARIN SODIUM (PORCINE) 1000 UNIT/ML IJ SOLN
INTRAMUSCULAR | Status: AC
  Filled 2020-11-26: qty 1

## 2020-11-26 MED ORDER — ALTEPLASE 2 MG IJ SOLR
2.0000 mg | Freq: Once | INTRAMUSCULAR | Status: DC | PRN
  Filled 2020-11-26: qty 2

## 2020-11-26 MED ORDER — LIDOCAINE-PRILOCAINE 2.5-2.5 % EX CREA: 1.0000 "application " | TOPICAL_CREAM | CUTANEOUS | Status: DC | PRN

## 2020-11-26 MED ORDER — WARFARIN 0.5 MG HALF TABLET
0.5000 mg | ORAL_TABLET | Freq: Once | ORAL | Status: AC
  Administered 2020-11-26: 0.5 mg via ORAL
  Filled 2020-11-26: qty 1

## 2020-11-26 NOTE — Progress Notes (Signed)
ANTICOAGULATION CONSULT NOTE - Follow Up Consult  Pharmacy Consult for Warfarin Indication: LVAD  Allergies  Allergen Reactions  . Meclizine Hcl Anaphylaxis and Swelling  . Ivabradine Nausea Only    Patient Measurements: Height: 5' 10.5" (179.1 cm) Weight:  (patient in chair) IBW/kg (Calculated) : 74.15 kg  Vital Signs: Temp: 98.1 F (36.7 C) (04/23 1335) Temp Source: Oral (04/23 1335) BP: 100/73 (04/23 1400) Pulse Rate: 57 (04/23 1400)  Labs: Recent Labs    11/24/20 0454 11/25/20 0445 11/26/20 0416  HGB 8.6* 8.6* 8.6*  HCT 28.2* 28.7* 29.2*  PLT 172 140* 171  LABPROT 32.8* 30.7* 29.7*  INR 3.2* 2.9* 2.8*  CREATININE 6.66* 4.34* 5.93*    Estimated Creatinine Clearance: 13.9 mL/min (A) (by C-G formula based on SCr of 5.93 mg/dL (H)).   Assessment: 60 yo male s/p new LVAD implant with HeartMate III on 3/10. Warfarin started per MD on 3/12, and pharmacy has now been consulted to take over warfarin dosing.   INR trending down slower today from 2.9 to 2.8 after restarting a very low dose. The patient has been very sensitive to small warfarin dose changes. The patient's diet has remained stable but he had 2 feeding supplements today (compared to 1 yesterday). No bleeding issues noted.   Goal of Therapy: INR 2-2.5 Monitor platelets by anticoagulation protocol: Yes   Plan:  Warfarin 0.5mg  tonight Daily PT/INR  Monitor for signs and symptoms of bleeding  [name], PharmD, BCPS PGY2 Cardiology Pharmacy Resident Phone: [phone] 11/26/2020  2:37 PM  Please check [email or web address] for unit-specific pharmacy phone numbers.

## 2020-11-26 NOTE — Progress Notes (Signed)
[facility] ROUNDING NOTE   Subjective:   Interval History: This is a 60 year old gentleman developed worsening renal failure with underlying stage III chronic kidney disease has been dialysis dependent since 10/18/2020.  He has a history of congestive heart failure with an ejection fraction of 20% status post LVAD 10/13/2020.  Blood pressure 100/80 pulse 83 temperature 99 O2 sats 95% room air  Last dialysis 11/24/2020 with 2.3 L removed.  Sodium 136 potassium 4.2 chloride 99 CO2 27 BUN 41 creatinine 5.93 glucose 97 calcium 9.2 phosphorus 5.3 magnesium 2.1 hemoglobin 8.6.   Objective:  Vital signs in last 24 hours:  Temp:  [98.5 F (36.9 C)-99 F (37.2 C)] 99 F (37.2 C) (04/23 0449) Pulse Rate:  [60-95] 83 (04/23 0449) Resp:  [16-20] 16 (04/23 0449) BP: (100)/(76-80) 100/80 (04/23 0449) SpO2:  [95 %-96 %] 95 % (04/23 0449) Weight:  [81 kg] 81 kg (04/23 0520)  Weight change: -1.433 kg Filed Weights   11/24/20 1400 11/24/20 1740 11/26/20 0520  Weight: 82.4 kg 80 kg 81 kg    Intake/Output: I/O last 3 completed shifts: In: 48 [P.O.:920] Out: -    Intake/Output this shift:  Total I/O In: 200 [P.O.:200] Out: 1 [Urine:1]  Gen: NAD, lying in bed CVS: mechanical hum throughout  Resp: Bilateral chest rise with no increased work of breathing Abd: soft, + drive line Ext: no LE edema,  ACCESS: R [facility] c/d/i   Basic Metabolic Panel: Recent Labs  Lab 11/22/20 0449 11/23/20 0400 11/24/20 0454 11/25/20 0445 11/26/20 0416  NA 134* 136 137 135 136  K 4.9 4.4 4.6 4.1 4.2  CL 95* 96* 99 97* 98  CO2 27 27 27 28 27   GLUCOSE 96 75 86 71 97  BUN 60* 38* 54* 25* 41*  CREATININE 6.86* 5.14* 6.66* 4.34* 5.93*  CALCIUM 9.0 9.3 9.3 9.2 9.2  MG 2.2 2.1 2.3 2.0 2.1  PHOS 6.8* 5.2* 6.0* 4.3 5.3*    Liver Function Tests: Recent Labs  Lab 11/22/20 0449 11/23/20 0400 11/24/20 0454 11/25/20 0445 11/26/20 0416  ALBUMIN 2.7* 2.7* 2.7* 2.6* 2.7*   No results for  input(s): LIPASE, AMYLASE in the last 168 hours. No results for input(s): AMMONIA in the last 168 hours.  CBC: Recent Labs  Lab 11/22/20 0449 11/23/20 0400 11/24/20 0454 11/25/20 0445 11/26/20 0416  WBC 9.5 8.9 9.7 7.9 8.9  HGB 8.5* 8.7* 8.6* 8.6* 8.6*  HCT 27.6* 28.8* 28.2* 28.7* 29.2*  MCV 89.0 89.7 89.0 90.0 90.1  PLT 174 151 172 140* 171    Cardiac Enzymes: No results for input(s): CKTOTAL, CKMB, CKMBINDEX, TROPONINI in the last 168 hours.  BNP: Invalid input(s): POCBNP  CBG: Recent Labs  Lab 11/25/20 1143 11/25/20 1638 11/25/20 2100 11/25/20 2111 11/26/20 0618  GLUCAP 108* 150* 94 91 84    Microbiology: Results for orders placed or performed during the hospital encounter of 09/28/20  Surgical PCR screen     Status: None   Collection Time: 10/03/20  9:12 PM   Specimen: Nasal Mucosa; Nasal Swab  Result Value Ref Range Status   MRSA, PCR NEGATIVE NEGATIVE Final   Staphylococcus aureus NEGATIVE NEGATIVE Final    Comment: (NOTE) The Xpert SA Assay (FDA approved for NASAL specimens in patients 66 years of age and older), is one component of a comprehensive surveillance program. It is not intended to diagnose infection nor to guide or monitor treatment. Performed at [facility], [place] [address] [place], [place] [address]  Culture, blood (routine x 2)     Status: None   Collection Time: 10/10/20  7:51 AM   Specimen: BLOOD LEFT ARM  Result Value Ref Range Status   Specimen Description BLOOD LEFT ARM  Final   Special Requests   Final    BOTTLES DRAWN AEROBIC ONLY Blood Culture adequate volume   Culture   Final    NO GROWTH 5 DAYS Performed at [facility], [address] [place], [place] [address]    Report Status 10/15/2020 FINAL  Final  Culture, blood (routine x 2)     Status: None   Collection Time: 10/10/20  7:52 AM   Specimen: BLOOD LEFT HAND  Result Value Ref Range Status   Specimen Description BLOOD LEFT HAND  Final   Special  Requests   Final    BOTTLES DRAWN AEROBIC ONLY Blood Culture adequate volume   Culture   Final    NO GROWTH 5 DAYS Performed at [facility], [place] [address] [place], [place] [address]    Report Status 10/15/2020 FINAL  Final  Culture, Urine     Status: None   Collection Time: 10/10/20 11:18 AM   Specimen: Urine, Random  Result Value Ref Range Status   Specimen Description URINE, RANDOM  Final   Special Requests NONE  Final   Culture   Final    NO GROWTH Performed at [facility], [place] [address], [place], [place] [address]    Report Status 10/11/2020 FINAL  Final  MRSA PCR Screening     Status: None   Collection Time: 10/12/20  6:16 PM   Specimen: Nasal Mucosa; Nasopharyngeal  Result Value Ref Range Status   MRSA by PCR NEGATIVE NEGATIVE Final    Comment:        The GeneXpert MRSA Assay (FDA approved for NASAL specimens only), is one component of a comprehensive MRSA colonization surveillance program. It is not intended to diagnose MRSA infection nor to guide or monitor treatment for MRSA infections. Performed at [facility], [place] [address], [place], [place] [address]   Culture, blood (routine x 2)     Status: None   Collection Time: 10/27/20  8:29 AM   Specimen: BLOOD LEFT HAND  Result Value Ref Range Status   Specimen Description BLOOD LEFT HAND  Final   Special Requests   Final    BOTTLES DRAWN AEROBIC ONLY Blood Culture adequate volume   Culture   Final    NO GROWTH 5 DAYS Performed at [facility], [place] [address], [place], [place] [address]    Report Status 11/01/2020 FINAL  Final  Culture, blood (routine x 2)     Status: None   Collection Time: 10/27/20  8:48 AM   Specimen: BLOOD LEFT FOREARM  Result Value Ref Range Status   Specimen Description BLOOD LEFT FOREARM  Final   Special Requests   Final    BOTTLES DRAWN AEROBIC ONLY Blood Culture adequate volume   Culture   Final    NO GROWTH 5 DAYS Performed at [facility],  [place] [address] [place], [place] [address]    Report Status 11/01/2020 FINAL  Final    Coagulation Studies: Recent Labs    11/24/20 0454 11/25/20 0445 11/26/20 0416  LABPROT 32.8* 30.7* 29.7*  INR 3.2* 2.9* 2.8*    Urinalysis: No results for input(s): COLORURINE, LABSPEC, PHURINE, GLUCOSEU, HGBUR, BILIRUBINUR, KETONESUR, PROTEINUR, UROBILINOGEN, NITRITE, LEUKOCYTESUR in the last  72 hours.  Invalid input(s): APPERANCEUR    Imaging: No results found.   Medications:   . sodium chloride 10 mL (11/17/20 1430)   . (feeding supplement) PROSource Plus  30 mL Oral BID BM  . amiodarone  200 mg Oral Daily  . vitamin C  1,000 mg Oral Daily  . Chlorhexidine Gluconate Cloth  6 each Topical Q0600  . Chlorhexidine Gluconate Cloth  6 each Topical Q0600  . Chlorhexidine Gluconate Cloth  6 each Topical Q0600  . darbepoetin (ARANESP) injection - DIALYSIS  100 mcg Intravenous Q Tue-HD  . feeding supplement (NEPRO CARB STEADY)  237 mL Oral BID BM  . gabapentin  100 mg Oral QHS  . guaiFENesin  600 mg Oral BID  . insulin aspart  0-15 Units Subcutaneous TID WC  . insulin aspart  0-5 Units Subcutaneous QHS  . insulin glargine  8 Units Subcutaneous Daily  . metoCLOPramide  5 mg Oral TID AC & HS  . midodrine  10 mg Oral TID WC  . multivitamin  1 tablet Oral QHS  . Muscle Rub   Topical BID  . pantoprazole  40 mg Oral Daily  . polyethylene glycol  17 g Oral BID  . rosuvastatin  10 mg Oral Daily  . scopolamine  1 patch Transdermal Q72H  . senna-docusate  2 tablet Oral BID  . sorbitol  30 mL Oral Daily  . Warfarin - Pharmacist Dosing Inpatient   Does not apply q1600   sodium chloride, acetaminophen, alteplase, alum & mag hydroxide-simeth, bisacodyl, dextrose, diphenhydrAMINE, guaiFENesin-dextromethorphan, heparin, lidocaine-prilocaine, ondansetron (ZOFRAN) IV, oxyCODONE, pentafluoroprop-tetrafluoroeth, prochlorperazine **OR** prochlorperazine **OR** prochlorperazine, sodium chloride flush, sodium  phosphate, traZODone, white petrolatum  Assessment/ Plan:  Acute kidney injury on chronic kidney disease stage III: With underlying chronic kidney disease likely from diabetes/hypertension and worsening renal function from hemodynamically mediated ATN and cardiorenal syndrome. CRRT 3/15-3/22, 3/24-27; HD since 3/28. -Unfortunately, no signs of renal recovery currently - s/p [facility] 11/07/20 with IR, appreciate assistance - HD TTS schedule now; tolerating well, dialysis last on 11/24/2020 with 2.3 L removed Next dialysis will be 11/26/2020 - Outpt CLIP underway-->renal navigator following;  -Previous provider d/w the patient and his son referring for permanent access placement. Also discuss with Dr. Aundra Dubin and Dr. Donzetta Matters whether in the subacute setting with a LVAD (placed 3/10) whether the timing is reasonable. Eventually would like to have a permanent acces in the arm given the risk of infection with a tunneled catheter. Would prefer graft unless vein caliber is excellent  Acute exacerbation of chronic systolic heart failure with cardiogenic shock:LVAD, placed 3/10. Volume status/UF managed with RRT. Per above UF to euvolemia. midodrine and warfarin, off Revatio d/t orthostasis  Anemia: Stable.. Likely secondary to chronic disease with CKD contributing.Transfuse PRBC as needed. Iron deficient, has beenrepleted IV.  Continue Aranesp 100 MCG weekly on Tuesday  Renal osteodystrophy:  Phosphorous 4.3  Atrial fibrillation: per cardiology, on amiodarone  Protein calorie malnutrition, hypoalbuminemia: push protein, per primary  DM II: per primary  Dispo: patient able to do HD in chair; working on setting up Outpatient dialysis   LOS: [place] @TODAY @9 :42 AM

## 2020-11-26 NOTE — Progress Notes (Signed)
PROGRESS NOTE   Subjective/Complaints: Had a pretty good night. No nausea this morning. Good appetite this morning, cleaned tray.  ROS: Patient denies fever, rash, sore throat, blurred vision,  vomiting, diarrhea, cough, shortness of breath or chest pain, joint or back pain, headache, or mood change.     Objective:   No results found. Recent Labs    11/25/20 0445 11/26/20 0416  WBC 7.9 8.9  HGB 8.6* 8.6*  HCT 28.7* 29.2*  PLT 140* 171   Recent Labs    11/25/20 0445 11/26/20 0416  NA 135 136  K 4.1 4.2  CL 97* 98  CO2 28 27  GLUCOSE 71 97  BUN 25* 41*  CREATININE 4.34* 5.93*  CALCIUM 9.2 9.2    Intake/Output Summary (Last 24 hours) at 11/26/2020 0859 Last data filed at 11/26/2020 0755 Gross per 24 hour  Intake 680 ml  Output --  Net 680 ml     Pressure Injury 10/16/20 Coccyx Mid Stage 3 -  Full thickness tissue loss. Subcutaneous fat may be visible but bone, tendon or muscle are NOT exposed. Blister that has drained, 2 cm X 4.5 cm (Active)  10/16/20 0800  Location: Coccyx  Location Orientation: Mid  Staging: Stage 3 -  Full thickness tissue loss. Subcutaneous fat may be visible but bone, tendon or muscle are NOT exposed. (as per WOC note on 11/14/20)  Wound Description (Comments): Blister that has drained, 2 cm X 4.5 cm  Present on Admission: No     Pressure Injury 10/16/20 Coccyx Right Stage 2 -  Partial thickness loss of dermis presenting as a shallow open injury with a red, pink wound bed without slough. blister that has drained, 0.5 cm X 0.5 cm (Active)  10/16/20 0800  Location: Coccyx  Location Orientation: Right  Staging: Stage 2 -  Partial thickness loss of dermis presenting as a shallow open injury with a red, pink wound bed without slough.  Wound Description (Comments): blister that has drained, 0.5 cm X 0.5 cm  Present on Admission: No    Physical Exam: Vital Signs Blood pressure 100/80, pulse  83, temperature 99 F (37.2 C), resp. rate 16, height 5' 10.5" (1.791 m), weight 81 kg, SpO2 95 %. Constitutional: No distress . Vital signs reviewed. HEENT: EOMI, oral membranes moist Neck: supple Cardiovascular: RRR without murmur. No JVD    Respiratory/Chest: CTA Bilaterally without wheezes or rales. Normal effort    GI/Abdomen: BS +, non-tender, non-distended Ext: no clubbing, cyanosis, or edema Psych: pleasant and cooperative Skin: tunneled cath right shoulder in place. Right foot intact. Sacral wound  superficial with predominant granulation tissue.  LVAD site clean, dry Neuro: Pt is cognitively appropriate with normal insight and awareness.  STM deficits. Cranial nerves 2-12 are intact. Sensory exam is normal. Reflexes are 2+ in all 4's. Fine motor coordination is intact. R>L UE tremors mild today. Motor function is grossly 4/5 with tremor. RUE  Remains 3-4/5 but still limited d/t pain in shoulder. has scattered sensory loss throughout right arm. LE motor 3+ to 4/5 Musculoskeletal: sensitivity right elbow---seems better, was feeding self with RUE    Assessment/Plan: 1. Functional deficits which require 3+ hours per  day of interdisciplinary therapy in a comprehensive inpatient rehab setting.  Physiatrist is providing close team supervision and 24 hour management of active medical problems listed below.  Physiatrist and rehab team continue to assess barriers to discharge/monitor patient progress toward functional and medical goals  Care Tool:  Bathing  Bathing activity did not occur:  (time constraints) Body parts bathed by patient: Left arm,Abdomen,Chest,Front perineal area,Right upper leg,Left upper leg,Face,Right arm,Buttocks   Body parts bathed by helper: Right lower leg,Left lower leg     Bathing assist Assist Level: Moderate Assistance - Patient 50 - 74%     Upper Body Dressing/Undressing Upper body dressing Upper body dressing/undressing activity did not occur (including  orthotics): Environmental limitations (PICC running) What is the patient wearing?: Pull over shirt    Upper body assist Assist Level: Minimal Assistance - Patient > 75%    Lower Body Dressing/Undressing Lower body dressing      What is the patient wearing?: Pants     Lower body assist Assist for lower body dressing: Moderate Assistance - Patient 50 - 74%     Toileting Toileting Toileting Activity did not occur Landscape architect and hygiene only): N/A (no void or bm)  Toileting assist Assist for toileting: Dependent - Patient 0%     Transfers Chair/bed transfer  Transfers assist  Chair/bed transfer activity did not occur: Safety/medical concerns  Chair/bed transfer assist level: Moderate Assistance - Patient 50 - 74% Chair/bed transfer assistive device: Programmer, multimedia   Ambulation assist   Ambulation activity did not occur: Safety/medical concerns  Assist level: Contact Guard/Touching assist Assistive device: Walker-rolling Max distance: 30   Walk 10 feet activity   Assist  Walk 10 feet activity did not occur: Safety/medical concerns  Assist level: Contact Guard/Touching assist Assistive device: Walker-rolling   Walk 50 feet activity   Assist Walk 50 feet with 2 turns activity did not occur: Safety/medical concerns  Assist level: Contact Guard/Touching assist Assistive device: Walker-rolling    Walk 150 feet activity   Assist Walk 150 feet activity did not occur: Safety/medical concerns         Walk 10 feet on uneven surface  activity   Assist Walk 10 feet on uneven surfaces activity did not occur: Safety/medical concerns         Wheelchair     Assist Will patient use wheelchair at discharge?: No (Per PT long term goals)   Wheelchair activity did not occur: Safety/medical concerns         Wheelchair 50 feet with 2 turns activity    Assist    Wheelchair 50 feet with 2 turns activity did not occur:  Safety/medical concerns       Wheelchair 150 feet activity     Assist  Wheelchair 150 feet activity did not occur: Safety/medical concerns       Blood pressure 100/80, pulse 83, temperature 99 F (37.2 C), resp. rate 16, height 5' 10.5" (1.791 m), weight 81 kg, SpO2 95 %.  Medical Problem List and Plan: 1.Functional and mobility deficitssecondary to debility after acute on chronic CHF with subsequent LVAD placement -patient maynotshower -ELOS/Goals: 5/2  -Continue CIR therapies including PT, OT    -pt with STM deficits  - scopolamine patch for ?vestibular symptom relief    -has 3 flights of stairs to enter home 2. Chronic systolic CHF s/p LVAD 3:  -DVT/anticoagulation:Pharmaceutical:Continue Coumadin, INR 2.1 on 4/17 -antiplatelet therapy: ASA 3.Right arm neuropathic pain/brachial plexus injury/Pain Management:Tylenol or oxycodone prn.  --  gabapentin 200 mgat bedtime with some benefit -sling prn for RUE support and comfort  -expect recovery of motor/sensory function in RUE  4. Mood:LCSW to follow for evaluation and support. -antipsychotic agents: N/A 5. Neuropsych: This patientiscapable of making decisions on hisown behalf. 6. Skin/Wound Care:Optimize nutritional status--continue Nephro and prosource.  --Sacral decubitus: Air mattress.    -appears much improved-->continue WTD dressing   -pt is NO LONGER RECEIVING HYDRO --Right foot wound resolved.  7. Fluids/Electrolytes/Nutrition:Strict I/O. Continue to monitor labs with HD? 8. Chronic systolic CHF s/p LVAD 3:  --Cardiology to follow and manage cardiac issues.  -daily weights Filed Weights   11/24/20 1400 11/24/20 1740 11/26/20 0520  Weight: 82.4 kg 80 kg 81 kg    4/23 bp/volume mgt per nephro and cards.    -off revatio   -TEDS, acclimation   -increased midodrine to 10mg  tid  with MAP in 70's yesterday 9. Acute on chronic anemia: Continue to monitor H/H/ monitor for signs of bleeding.  --Was transfused with multiple units PRBC. --ON Nulecit Tu/Th/Sa  -transfuse as needed in HD hgb holding at 8.6 10. T2DM: Hgb A1c- monitor BS ac/hs.BS reasonable on current intake --continue Lantus in am with SSI for elevated BS.   4/23 sugars low but improved lantus decreased to 8u qam   -may need to readjust as he's eating more CBG (last 3)  Recent Labs    11/25/20 2100 11/25/20 2111 11/26/20 0618  GLUCAP 94 91 84    11. ESRD: Hyperkalemia being managed with lokelma/HD. -Scheduled HD at the end of the day to help with tolerance of activity during the day. --RD following 12. Mild leucocytosis: Resolved  13. A fib: NSR, HR remains controlled on Amiodarone, continue 14. Nausea:   -- added mucinex with benefit for cough trigger  -working on regular bm's.   -on miralax and senna-s bid  -hs dose of reglan added    LOS: 12 days A FACE TO FACE EVALUATION WAS PERFORMED  Meredith Staggers 11/26/2020, 8:59 AM

## 2020-11-26 NOTE — Progress Notes (Signed)
Occupational Therapy Session Note  Patient Details  Name: Marcus Johnson. MRN: [id number] Date of Birth: August 30, 1960  Today's Date: 11/26/2020 OT Individual Time: 4098-1191 OT Individual Time Calculation (min): 58 min    Short Term Goals: Week 1:  OT Short Term Goal 1 (Week 1): Pt will don pants with min A OT Short Term Goal 1 - Progress (Week 1): Progressing toward goal OT Short Term Goal 2 (Week 1): Pt will tolerate standing at the sink for ADL with no c/o light headness OT Short Term Goal 2 - Progress (Week 1): Met OT Short Term Goal 3 (Week 1): Pt will complete toilet transfer with min A OT Short Term Goal 3 - Progress (Week 1): Met  Skilled Therapeutic Interventions/Progress Updates:    1;1. Pt received in bed finishing breakfast with no pain reported. Pt MAP assessed with doppler at 75 in bed. Pt sup>sit EOB with S using bed rail and cuing for sternal precautions. Pt completes UB bathing and dressing with MIN A for bathing and MOD A for dressing with significant difficulty and increased time to orient shirt. Pt demo increased dressing apraxia today but able to recall hemi order for dressing. Pt sit to stand with MOD A and mod-MAX A for balance d/t posterior lean. Pt completes peri/buttock hygeine in standing and OT dons brief. Pt requires A to pull pants back up past hips. Pt returns to [place] with MIN A for 1LE managemnet. Exited session with pt seated in bed, exit alarm on and call light in reach   Therapy Documentation Precautions:  Precautions Precautions: Fall,Sternal,Other (comment) Precaution Comments: LVAD, sacral wound, Left BPPV Required Braces or Orthoses: Other Brace Other Brace: thumb loop and buddy strap R hand Restrictions Weight Bearing Restrictions: Yes RUE Weight Bearing:  (sternal) Other Position/Activity Restrictions: sternal precautions General:   Vital Signs: Therapy Vitals Temp: 99 F (37.2 C) Pulse Rate: 83 Resp: 16 BP: 100/80 Patient  Position (if appropriate): Lying Oxygen Therapy SpO2: 95 % O2 Device: Room Air Pain: Pain Assessment Pain Scale: 0-10 Pain Score: 7  Pain Type: Acute pain Pain Location: Sacrum Pain Orientation: Mid Pain Descriptors / Indicators: Aching Pain Frequency: Intermittent Pain Onset: On-going Pain Intervention(s): Medication (See eMAR);Repositioned ADL: ADL Eating: Set up Where Assessed-Eating: Bed level Grooming: Minimal assistance Where Assessed-Grooming: Edge of bed Upper Body Bathing: Unable to assess Lower Body Bathing: Unable to assess Upper Body Dressing: Unable to assess Lower Body Dressing: Maximal assistance Toileting: Unable to assess Toilet Transfer: Unable to assess Tub/Shower Transfer Method: Unable to assess Gaffer Transfer: Unable to assess Vision   Perception    Praxis   Exercises:   Other Treatments:     Therapy/Group: Individual Therapy  Tonny Branch 11/26/2020, 6:57 AM

## 2020-11-26 NOTE — Progress Notes (Signed)
Patient ID: Marcus Lucken., male   DOB: May 12, 1961, 60 y.o.   MRN: [id number]     Advanced Heart Failure Rounding Note  PCP-Cardiologist: No primary care provider on file.   Subjective:    - 2/27 Milrinone switched to dobutamine 5 mcg.  - 2/28 Moved to ICU. Dobutamine increased to 7.5 mcg and Norepi 3 mcg added. Diuretics held.  - 3/1 Impella 5.5 placed.  - 3/4 Teeth removed - 3/10 HM3 LVAD placed - 3/13 VAD speed increased to 5500. [name] out  - 3/14 Nephrology consulted. Given 1UPRBCs  - 3/15 Started CRRT. Given 1UPRBCs.  - 3/23 VAD Speed turned down to 5300  - 3/23 CRRT and Milrinone Stopped. Back in Afib - 3/24 CRRT restarted.  - 3/27 CVVH off.  - 3/28 iHD - 3/29 iHD. Walked 75 feet!! - 3/20 Ramp Echo: Fixed speed: 5200 Low speed limit: 4900 - 4/3 S/P RIJ HD catheter.  - 4/10 60th B-day!  - 4/11 Transferred to CIR. Back on heparin drip.   INR 2.8, LDH 172.   No nausea/vomiting, still some dizziness with walking.  Midodrine at 10 mg tid.   LVAD Interrogation HM 3: Speed: 5200 Flow: 4.4 PI: 4.1 Power: 3.7. VAD interrogated personally. 1 PI event. Parameters stable.   Objective:   Weight Range: 81 kg Body mass index is 25.25 kg/m.   Vital Signs:   Temp:  [98.5 F (36.9 C)-99 F (37.2 C)] 99 F (37.2 C) (04/23 0449) Pulse Rate:  [60-95] 83 (04/23 0449) Resp:  [16-20] 16 (04/23 0449) BP: (100)/(76-80) 100/80 (04/23 0449) SpO2:  [95 %-96 %] 95 % (04/23 0449) Weight:  [81 kg] 81 kg (04/23 0520) Last BM Date: 11/25/20  Weight change: Filed Weights   11/24/20 1400 11/24/20 1740 11/26/20 0520  Weight: 82.4 kg 80 kg 81 kg    Intake/Output:   Intake/Output Summary (Last 24 hours) at 11/26/2020 1031 Last data filed at 11/26/2020 0941 Gross per 24 hour  Intake 680 ml  Output 1 ml  Net 679 ml      Physical Exam  General: Well appearing this am. NAD.  HEENT: Normal. Neck: Supple, JVP 7-8 cm. Carotids OK.  Cardiac:  Mechanical heart sounds with LVAD  hum present.  Lungs:  CTAB, normal effort.  Abdomen:  NT, ND, no HSM. No bruits or masses. +BS  LVAD exit site: Well-healed and incorporated. Dressing dry and intact. No erythema or drainage. Stabilization device present and accurately applied. Driveline dressing changed daily per sterile technique. Extremities:  Warm and dry. No cyanosis, clubbing, rash, or edema.  Neuro:  Alert & oriented x 3. Cranial nerves grossly intact. Moves all 4 extremities w/o difficulty. Affect pleasant     Labs    CBC Recent Labs    11/25/20 0445 11/26/20 0416  WBC 7.9 8.9  HGB 8.6* 8.6*  HCT 28.7* 29.2*  MCV 90.0 90.1  PLT 140* 818   Basic Metabolic Panel Recent Labs    11/25/20 0445 11/26/20 0416  NA 135 136  K 4.1 4.2  CL 97* 98  CO2 28 27  GLUCOSE 71 97  BUN 25* 41*  CREATININE 4.34* 5.93*  CALCIUM 9.2 9.2  MG 2.0 2.1  PHOS 4.3 5.3*   Liver Function Tests Recent Labs    11/25/20 0445 11/26/20 0416  ALBUMIN 2.6* 2.7*   No results for input(s): LIPASE, AMYLASE in the last 72 hours. Cardiac Enzymes No results for input(s): CKTOTAL, CKMB, CKMBINDEX, TROPONINI in the last 72 hours.  BNP: BNP (last 3 results) Recent Labs    11/10/20 0150 11/17/20 0411 11/24/20 0454  BNP 905.1* 630.2* 723.8*    ProBNP (last 3 results) No results for input(s): PROBNP in the last 8760 hours.   D-Dimer No results for input(s): DDIMER in the last 72 hours. Hemoglobin A1C No results for input(s): HGBA1C in the last 72 hours. Fasting Lipid Panel No results for input(s): CHOL, HDL, LDLCALC, TRIG, CHOLHDL, LDLDIRECT in the last 72 hours. Thyroid Function Tests No results for input(s): TSH, T4TOTAL, T3FREE, THYROIDAB in the last 72 hours.  Invalid input(s): FREET3  Other results:   Imaging    No results found.   Medications:     Scheduled Medications: . (feeding supplement) PROSource Plus  30 mL Oral BID BM  . amiodarone  200 mg Oral Daily  . vitamin C  1,000 mg Oral Daily  .  Chlorhexidine Gluconate Cloth  6 each Topical Q0600  . Chlorhexidine Gluconate Cloth  6 each Topical Q0600  . Chlorhexidine Gluconate Cloth  6 each Topical Q0600  . darbepoetin (ARANESP) injection - DIALYSIS  100 mcg Intravenous Q Tue-HD  . feeding supplement (NEPRO CARB STEADY)  237 mL Oral BID BM  . gabapentin  100 mg Oral QHS  . guaiFENesin  600 mg Oral BID  . insulin aspart  0-15 Units Subcutaneous TID WC  . insulin aspart  0-5 Units Subcutaneous QHS  . insulin glargine  8 Units Subcutaneous Daily  . metoCLOPramide  5 mg Oral TID AC & HS  . midodrine  10 mg Oral TID WC  . multivitamin  1 tablet Oral QHS  . Muscle Rub   Topical BID  . pantoprazole  40 mg Oral Daily  . polyethylene glycol  17 g Oral BID  . rosuvastatin  10 mg Oral Daily  . scopolamine  1 patch Transdermal Q72H  . senna-docusate  2 tablet Oral BID  . sorbitol  30 mL Oral Daily  . Warfarin - Pharmacist Dosing Inpatient   Does not apply q1600    Infusions: . sodium chloride 10 mL (11/17/20 1430)    PRN Medications: sodium chloride, acetaminophen, alteplase, alum & mag hydroxide-simeth, bisacodyl, dextrose, diphenhydrAMINE, guaiFENesin-dextromethorphan, heparin, lidocaine-prilocaine, ondansetron (ZOFRAN) IV, oxyCODONE, pentafluoroprop-tetrafluoroeth, prochlorperazine **OR** prochlorperazine **OR** prochlorperazine, sodium chloride flush, sodium phosphate, traZODone, white petrolatum    Assessment/Plan   1.Acute on chronic systolic HF -> cardiogenic shock:ICM.Has[employer]tr[employer] ICD. EF 15%. Low output persisted despite milrinone and then dobutamine with rise in creatinine to 2.5. Impella 5.5 placed 3/1.  Creatinine improved to 1.7 and HM3 LVAD was placed on 3/10. Developed post-op renal failure requiring CVVH. Now off milrinone and epinephrine. Last echo showed moderate RV enlargement with mildly decreased RV function and VAD speed decreased to 5300 due to left-sided septum. CRRT stopped 3/23 but restarted 3/24. CVVH  stopped again on 3/27. Ramp echo 3/30 with speed decreased to 5200. Tolerating iHD for volume management.  MAP 80s. Midodrine increased to 10 mg tid due to dizzinness/orthostatic.  - Continue compression stockings.  - HD for volume management - Continue midodrine 10 tid.  2. LVAD: 3/13 VAD speed increased to 5500. 3/23 speed decreased to 5300 and decreased to 5200 on 3/30. VAD interrogated personally. LDH 177, INR 3.2 today  - On warfarin, on ASA 81 daily.  Discussed warfarin dosing with PharmD personally. - DL site ok 3. CAD s/p [id number]:Last cath in 6/19 with patent grafts. - Continue Crestor 40 mg daily.  4. AKI on CKD Stage 3b: Suspect  that this is a combination of cardiorenal syndrome and diabetic nephropathy and possibly ATN. Developed post-op AKI. Started on CRRT 3/15. CVVH stopped 3/23 but restarted 3/24. Now getting iHD, looks like he will be HD-dependent.  Has tunneled catheter.  - HD T/Th/S - Dr Augustin Coupe to talk with VVS, but with continuous flow LVAD, think AV fistula would be less likely to mature.  Will await decision by VVS and nephrology in terms of access.  5. Atrial flutter/fibrillation: s/p DC-CV 08/21/18.  - Continue amio 200 mg daily.   6. Wound R Foot: Partial thickness skin loss noted. Cover with mepilex border and change every 5 days. Resolved.  7. Right arm pain: Likely neuropathic from stretch of brachial plexus with Impella placement.  - Continue gabapentin.  8. ID:  WBCs normal. Afebrile. Blood Cx- NGTD.  9. QP:RFFMBWG of very poor control but hgbA1ctrending down. Most recent 7.7%.  Insulin drip stopped 3/24. Blood glucose controlled on reduced Lantus and  sliding scale insulin to moderate.   - Diabetes coordinators have seen 10. Anemia: Has been stable. hgb 8.6 today.  11. Deconditioning - Continue to work with CIR 12. Unstageable Pressure Ulcer on buttocks: Much improved 100% granulation.  - Hydrotherapy completed - Continue repositioning 13. NSVT: No longer on  the monitor.  14. Dizziness: Still with symptoms that seem like BPPV at times. Has had Epley maneuvers.  15. Nausea/vomiting: periodically occurring overnight/in the early am. Continue symptomatic treatment.   Loralie Champagne 11/26/2020

## 2020-11-27 LAB — CBC
HCT: 31 % — ABNORMAL LOW (ref 39.0–52.0)
Hemoglobin: 9.1 g/dL — ABNORMAL LOW (ref 13.0–17.0)
MCH: 26.3 pg (ref 26.0–34.0)
MCHC: 29.4 g/dL — ABNORMAL LOW (ref 30.0–36.0)
MCV: 89.6 fL (ref 80.0–100.0)
Platelets: 171 10*3/uL (ref 150–400)
RBC: 3.46 MIL/uL — ABNORMAL LOW (ref 4.22–5.81)
RDW: 17.1 % — ABNORMAL HIGH (ref 11.5–15.5)
WBC: 9.3 10*3/uL (ref 4.0–10.5)
nRBC: 0 % (ref 0.0–0.2)

## 2020-11-27 LAB — PROTIME-INR
INR: 2.2 — ABNORMAL HIGH (ref 0.8–1.2)
Prothrombin Time: 24.6 seconds — ABNORMAL HIGH (ref 11.4–15.2)

## 2020-11-27 LAB — GLUCOSE, CAPILLARY
Glucose-Capillary: 98 mg/dL (ref 70–99)
Glucose-Capillary: 128 mg/dL — ABNORMAL HIGH (ref 70–99)
Glucose-Capillary: 122 mg/dL — ABNORMAL HIGH (ref 70–99)
Glucose-Capillary: 131 mg/dL — ABNORMAL HIGH (ref 70–99)

## 2020-11-27 LAB — RENAL FUNCTION PANEL
Albumin: 2.6 g/dL — ABNORMAL LOW (ref 3.5–5.0)
Anion gap: 8 (ref 5–15)
BUN: 28 mg/dL — ABNORMAL HIGH (ref 6–20)
CO2: 28 mmol/L (ref 22–32)
Calcium: 9.2 mg/dL (ref 8.9–10.3)
Chloride: 98 mmol/L (ref 98–111)
Creatinine, Ser: 4.4 mg/dL — ABNORMAL HIGH (ref 0.61–1.24)
GFR, Estimated: 15 mL/min — ABNORMAL LOW (ref >60)
Glucose, Bld: 107 mg/dL — ABNORMAL HIGH (ref 70–99)
Phosphorus: 3.8 mg/dL (ref 2.5–4.6)
Potassium: 4.3 mmol/L (ref 3.5–5.1)
Sodium: 134 mmol/L — ABNORMAL LOW (ref 135–145)

## 2020-11-27 LAB — MAGNESIUM: Magnesium: 2.1 mg/dL (ref 1.7–2.4)

## 2020-11-27 LAB — LACTATE DEHYDROGENASE: LDH: 164 U/L (ref 98–192)

## 2020-11-27 MED ORDER — WARFARIN SODIUM 1 MG PO TABS
1.0000 mg | ORAL_TABLET | Freq: Once | ORAL | Status: AC
  Administered 2020-11-27: 1 mg via ORAL
  Filled 2020-11-27: qty 1

## 2020-11-27 NOTE — Progress Notes (Signed)
[facility] ROUNDING NOTE   Subjective:   Interval History: This is a 60 year old gentleman developed worsening renal failure with underlying stage III chronic kidney disease has been dialysis dependent since 10/18/2020.  He has a history of congestive heart failure with an ejection fraction of 20% status post LVAD 10/13/2020.  Blood pressure 83/71 pulse 72 temperature 98 O2 sats 100% room air  Last dialysis 11/26/2020 with 1.2 L removed.  Next dialysis will be 11/29/2020  Sodium 134 potassium 4.3 chloride 98 CO2 28 BUN 28 creatinine 4.4 glucose 107 calcium 9.2 phosphorus 3.8 albumin 2.6 magnesium 2.1 hemoglobin 9.1   Objective:  Vital signs in last 24 hours:  Temp:  [98 F (36.7 C)-99 F (37.2 C)] 98 F (36.7 C) (04/24 0445) Pulse Rate:  [55-87] 72 (04/24 0934) Resp:  [17-19] 18 (04/24 0445) BP: (83-131)/(56-95) 83/71 (04/24 0934) SpO2:  [95 %-100 %] 100 % (04/24 0445) Weight:  [81.6 kg] 81.6 kg (04/24 0445)  Weight change: 0.59 kg Filed Weights   11/26/20 0520 11/27/20 0445  Weight: 81 kg 81.6 kg    Intake/Output: I/O last 3 completed shifts: In: 700 [P.O.:700] Out: 1223 [Urine:101; Other:1122]   Intake/Output this shift:  No intake/output data recorded.  Gen: NAD, lying in bed CVS: mechanical hum throughout  Resp: Bilateral chest rise with no increased work of breathing Abd: soft, + drive line Ext: no LE edema,  ACCESS: [facility] c/d/i   Basic Metabolic Panel: Recent Labs  Lab 11/23/20 0400 11/24/20 0454 11/25/20 0445 11/26/20 0416 11/27/20 0410  NA 136 137 135 136 134*  K 4.4 4.6 4.1 4.2 4.3  CL 96* 99 97* 98 98  CO2 27 27 28 27 28   GLUCOSE 75 86 71 97 107*  BUN 38* 54* 25* 41* 28*  CREATININE 5.14* 6.66* 4.34* 5.93* 4.40*  CALCIUM 9.3 9.3 9.2 9.2 9.2  MG 2.1 2.3 2.0 2.1 2.1  PHOS 5.2* 6.0* 4.3 5.3* 3.8    Liver Function Tests: Recent Labs  Lab 11/23/20 0400 11/24/20 0454 11/25/20 0445 11/26/20 0416 11/27/20 0410  ALBUMIN 2.7*  2.7* 2.6* 2.7* 2.6*   No results for input(s): LIPASE, AMYLASE in the last 168 hours. No results for input(s): AMMONIA in the last 168 hours.  CBC: Recent Labs  Lab 11/23/20 0400 11/24/20 0454 11/25/20 0445 11/26/20 0416 11/27/20 0410  WBC 8.9 9.7 7.9 8.9 9.3  HGB 8.7* 8.6* 8.6* 8.6* 9.1*  HCT 28.8* 28.2* 28.7* 29.2* 31.0*  MCV 89.7 89.0 90.0 90.1 89.6  PLT 151 172 140* 171 171    Cardiac Enzymes: No results for input(s): CKTOTAL, CKMB, CKMBINDEX, TROPONINI in the last 168 hours.  BNP: Invalid input(s): POCBNP  CBG: Recent Labs  Lab 11/26/20 0618 11/26/20 1128 11/26/20 1732 11/26/20 2100 11/27/20 0613  GLUCAP 84 129* 134* 175* 53    Microbiology: Results for orders placed or performed during the hospital encounter of 09/28/20  Surgical PCR screen     Status: None   Collection Time: 10/03/20  9:12 PM   Specimen: Nasal Mucosa; Nasal Swab  Result Value Ref Range Status   MRSA, PCR NEGATIVE NEGATIVE Final   Staphylococcus aureus NEGATIVE NEGATIVE Final    Comment: (NOTE) The Xpert SA Assay (FDA approved for NASAL specimens in patients 36 years of age and older), is one component of a comprehensive surveillance program. It is not intended to diagnose infection nor to guide or monitor treatment. Performed at [facility], [place] [address], [place], [place] [address]  Culture, blood (routine x 2)     Status: None   Collection Time: 10/10/20  7:51 AM   Specimen: BLOOD LEFT ARM  Result Value Ref Range Status   Specimen Description BLOOD LEFT ARM  Final   Special Requests   Final    BOTTLES DRAWN AEROBIC ONLY Blood Culture adequate volume   Culture   Final    NO GROWTH 5 DAYS Performed at [facility], [address], [place], [place] [address]    Report Status 10/15/2020 FINAL  Final  Culture, blood (routine x 2)     Status: None   Collection Time: 10/10/20  7:52 AM   Specimen: BLOOD LEFT HAND  Result Value Ref Range Status   Specimen Description  BLOOD LEFT HAND  Final   Special Requests   Final    BOTTLES DRAWN AEROBIC ONLY Blood Culture adequate volume   Culture   Final    NO GROWTH 5 DAYS Performed at [facility], [place] [address] [place], [place] [address]    Report Status 10/15/2020 FINAL  Final  Culture, Urine     Status: None   Collection Time: 10/10/20 11:18 AM   Specimen: Urine, Random  Result Value Ref Range Status   Specimen Description URINE, RANDOM  Final   Special Requests NONE  Final   Culture   Final    NO GROWTH Performed at [facility], [place] [address] [place], [place] [address]    Report Status 10/11/2020 FINAL  Final  MRSA PCR Screening     Status: None   Collection Time: 10/12/20  6:16 PM   Specimen: Nasal Mucosa; Nasopharyngeal  Result Value Ref Range Status   MRSA by PCR NEGATIVE NEGATIVE Final    Comment:        The GeneXpert MRSA Assay (FDA approved for NASAL specimens only), is one component of a comprehensive MRSA colonization surveillance program. It is not intended to diagnose MRSA infection nor to guide or monitor treatment for MRSA infections. Performed at [facility], [place] [address] [place], [place] [address]   Culture, blood (routine x 2)     Status: None   Collection Time: 10/27/20  8:29 AM   Specimen: BLOOD LEFT HAND  Result Value Ref Range Status   Specimen Description BLOOD LEFT HAND  Final   Special Requests   Final    BOTTLES DRAWN AEROBIC ONLY Blood Culture adequate volume   Culture   Final    NO GROWTH 5 DAYS Performed at [facility], [place] [address], [place], [place] [address]    Report Status 11/01/2020 FINAL  Final  Culture, blood (routine x 2)     Status: None   Collection Time: 10/27/20  8:48 AM   Specimen: BLOOD LEFT FOREARM  Result Value Ref Range Status   Specimen Description BLOOD LEFT FOREARM  Final   Special Requests   Final    BOTTLES DRAWN AEROBIC ONLY Blood Culture adequate volume   Culture   Final    NO GROWTH 5  DAYS Performed at [facility], [place] [address], [place], [place] [address]    Report Status 11/01/2020 FINAL  Final    Coagulation Studies: Recent Labs    11/25/20 0445 11/26/20 0416 11/27/20 0410  LABPROT 30.7* 29.7* 24.6*  INR 2.9* 2.8* 2.2*    Urinalysis: No results for input(s): COLORURINE, LABSPEC, PHURINE, GLUCOSEU, HGBUR, BILIRUBINUR, KETONESUR, PROTEINUR, UROBILINOGEN, NITRITE, LEUKOCYTESUR in the last  72 hours.  Invalid input(s): APPERANCEUR    Imaging: No results found.   Medications:   . sodium chloride 10 mL (11/17/20 1430)  . sodium chloride    . sodium chloride     . (feeding supplement) PROSource Plus  30 mL Oral BID BM  . amiodarone  200 mg Oral Daily  . vitamin C  1,000 mg Oral Daily  . Chlorhexidine Gluconate Cloth  6 each Topical Q0600  . Chlorhexidine Gluconate Cloth  6 each Topical Q0600  . Chlorhexidine Gluconate Cloth  6 each Topical Q0600  . darbepoetin (ARANESP) injection - DIALYSIS  100 mcg Intravenous Q Tue-HD  . feeding supplement (NEPRO CARB STEADY)  237 mL Oral BID BM  . gabapentin  100 mg Oral QHS  . guaiFENesin  600 mg Oral BID  . insulin aspart  0-15 Units Subcutaneous TID WC  . insulin aspart  0-5 Units Subcutaneous QHS  . insulin glargine  8 Units Subcutaneous Daily  . metoCLOPramide  5 mg Oral TID AC & HS  . midodrine  10 mg Oral TID WC  . multivitamin  1 tablet Oral QHS  . Muscle Rub   Topical BID  . pantoprazole  40 mg Oral Daily  . polyethylene glycol  17 g Oral BID  . rosuvastatin  10 mg Oral Daily  . scopolamine  1 patch Transdermal Q72H  . senna-docusate  2 tablet Oral BID  . sorbitol  30 mL Oral Daily  . Warfarin - Pharmacist Dosing Inpatient   Does not apply q1600   sodium chloride, sodium chloride, sodium chloride, acetaminophen, alteplase, alteplase, alum & mag hydroxide-simeth, bisacodyl, dextrose, diphenhydrAMINE, guaiFENesin-dextromethorphan, heparin, heparin, lidocaine (PF), lidocaine-prilocaine,  ondansetron (ZOFRAN) IV, oxyCODONE, pentafluoroprop-tetrafluoroeth, prochlorperazine **OR** prochlorperazine **OR** prochlorperazine, sodium chloride flush, sodium phosphate, traZODone, white petrolatum  Assessment/ Plan:  Acute kidney injury on chronic kidney disease stage III: With underlying chronic kidney disease likely from diabetes/hypertension and worsening renal function from hemodynamically mediated ATN and cardiorenal syndrome. CRRT 3/15-3/22, 3/24-27; HD since 3/28. -Unfortunately, no signs of renal recovery currently - s/p [facility] 11/07/20 with IR, appreciate assistance - HD TTS schedule now; tolerating well, dialysis last on 11/26/2020 with 1.2 L next dialysis will be 11/29/2020 - Outpt CLIP underway-->renal navigator following;  -Previous provider d/w the patient and his son referring for permanent access placement. Also discuss with Dr. Aundra Dubin and Dr. Donzetta Matters whether in the subacute setting with a LVAD (placed 3/10) whether the timing is reasonable. Eventually would like to have a permanent acces in the arm given the risk of infection with a tunneled catheter. Would prefer graft unless vein caliber is excellent  Acute exacerbation of chronic systolic heart failure with cardiogenic shock:LVAD, placed 3/10. Volume status/UF managed with RRT. Per above UF to euvolemia. midodrine and warfarin, off Revatio d/t orthostasis  Anemia: Stable.. Likely secondary to chronic disease with CKD contributing.Transfuse PRBC as needed. Iron deficient, has beenrepleted IV.  Continue Aranesp 100 MCG weekly on Tuesday  Renal osteodystrophy:  Phosphorous 4.3  Atrial fibrillation: per cardiology, on amiodarone  Protein calorie malnutrition, hypoalbuminemia: push protein, per primary  DM II: per primary  Dispo: patient able to do HD in chair; working on setting up Outpatient dialysis   LOS: [place] @TODAY @9 :44 AM

## 2020-11-27 NOTE — Progress Notes (Signed)
Physical Therapy Session Note  Patient Details  Name: Marcus Johnson. MRN: [id number] Date of Birth: 06-01-1961  Today's Date: 11/27/2020 PT Individual Time: 3267-1245 PT Individual Time Calculation (min): 80 min   Short Term Goals: Week 2:  PT Short Term Goal 1 (Week 2): Patient will perform basic transfers with CGA using LRAD consistently. PT Short Term Goal 2 (Week 2): Patient will consistently ambulate >50 ft using LRAD with CGA. PT Short Term Goal 3 (Week 2): Patient will initiate stair training.  Skilled Therapeutic Interventions/Progress Updates:     Patient in w/c in the room upon PT arrival. Patient alert and agreeable to PT session. Patient denied pain during session. Changed patient from LVAD monitor to battery packs with mod-max A. Patient able to manage locking mechanism with facilitation for R hand holding while independently turning with the L. Able to hold cord in his L hand at PT attached the battery on each side.   MAP with Doppler: Sitting: 82 (asymptomatic) Standing: 84 (asymptomatic) Patient asymptomatic with activity today, MAP not assessed unless symptoms present. After all activity: 94 (mild symptoms)  Therapeutic Activity: Transfers: Patient performed sit to/from stand x5 with min A-CGA using RW. Patient able to teach back hand position on RW, provided cues for touching B lower extremities to the chair to complete turn before sitting for safety.  Gait Training:  Patient ambulated 126 feet and 135 feet using RW with CGA. Ambulated with decreased gait speed, decreased step length and height with minimal trunk rotation or visual scanning and slight veer to the R. Provided verbal cues for pacing, paced breathing, and improved visual scanning for visual and spatial awareness. Patient ascended/descended 4-6" steps x3 using B rails with CGA. Performed step-to gait pattern leading with R while ascending and L while descending. Provided cues for technique and  pacing. Patient took 3-4 min sitting rest breaks between trials for energy conservation and to maintain MAP <90 to reduce onset of symptoms. Provided education on energy conservation, home set-up, instructed patient to place a chair between each flight of steps to rest between flights, patient also reports that he and his family are working diligently to find a first floor apartment to reduce the risk and strain of the stairs leading to their third floor apartment at least 3x/week for dialysis.   Patient returned to the LVAD monitor with total A due to patient fatigue and symptoms of nausea/wooziness. All batteries charging at end of session.  Patient in w/c in the room at end of session with breaks locked and all needs within reach. RN made aware of patient's symptoms and vitals at end of session.   Therapy Documentation Precautions:  Precautions Precautions: Fall,Sternal,Other (comment) Precaution Comments: LVAD, sacral wound, Left BPPV Required Braces or Orthoses: Other Brace Other Brace: thumb loop and buddy strap R hand Restrictions Weight Bearing Restrictions: Yes RUE Weight Bearing:  (sternal) Other Position/Activity Restrictions: sternal precautions   Therapy/Group: Individual Therapy  Cason Dabney L Brehanna Deveny PT, DPT  11/27/2020, 7:18 PM

## 2020-11-27 NOTE — Progress Notes (Signed)
Occupational Therapy Session Note  Patient Details  Name: Marcus Johnson. MRN: [id number] Date of Birth: 1960/12/15  Today's Date: 11/27/2020 OT Individual Time: 9774-1423 OT Individual Time Calculation (min): 56 min    Short Term Goals: Week 2:  OT Short Term Goal 1 (Week 2): Pt will complete toilet transfer with supervision OT Short Term Goal 2 (Week 2): Pt will complete LB dressing with CGA OT Short Term Goal 3 (Week 2): Pt will appropriately instruct a caregiver to change LVAD to battery power  Skilled Therapeutic Interventions/Progress Updates:    Pt received supine with mild pain in his sacrum. Pt completed bed mobility with supervision. Much better performance this morning overall. Pt completed sit >stand with min A and completed pivot to the w/c. He was taken to the sink where he completed oral care and UB bathing with set up assist. Pt requesting assist to shave head, OT provided for safety. Pt donned shirt with min A, improved motor planning but still requiring cueing for orientation and poor error recognition. Pt stood with UE support on the sink with CGA. He was able to complete peri hygiene in standing with supervision! Pt sat back down and required min A to doff clothing and to don shorts/incontinence brief over feet. Pt able to stand and pull up shorts in standing. Pt was encouraged to remain sitting up in w/c and he was agreeable. RN given report for carryover. All needs within reach.   Therapy Documentation Precautions:  Precautions Precautions: Fall,Sternal,Other (comment) Precaution Comments: LVAD, sacral wound, Left BPPV Required Braces or Orthoses: Other Brace Other Brace: thumb loop and buddy strap R hand Restrictions Weight Bearing Restrictions: Yes RUE Weight Bearing:  (sternal) Other Position/Activity Restrictions: sternal precautions   Therapy/Group: Individual Therapy  Curtis Sites 11/27/2020, 6:52 AM

## 2020-11-27 NOTE — Progress Notes (Signed)
ANTICOAGULATION CONSULT NOTE - Follow Up Consult  Pharmacy Consult for Warfarin Indication: LVAD  Allergies  Allergen Reactions  . Meclizine Hcl Anaphylaxis and Swelling  . Ivabradine Nausea Only    Patient Measurements: Height: 5' 10.5" (179.1 cm) Weight: 81.6 kg (179 lb 12.8 oz) IBW/kg (Calculated) : 74.15 kg  Vital Signs: Temp: 98 F (36.7 C) (04/24 1247) Temp Source: Oral (04/24 1247) BP: 97/75 (04/24 1247) Pulse Rate: 90 (04/24 1247)  Labs: Recent Labs    11/25/20 0445 11/26/20 0416 11/27/20 0410  HGB 8.6* 8.6* 9.1*  HCT 28.7* 29.2* 31.0*  PLT 140* 171 171  LABPROT 30.7* 29.7* 24.6*  INR 2.9* 2.8* 2.2*  CREATININE 4.34* 5.93* 4.40*    Estimated Creatinine Clearance: 18.7 mL/min (A) (by C-G formula based on SCr of 4.4 mg/dL (H)).   Assessment: 60 yo male s/p new LVAD implant with HeartMate III on 3/10. Warfarin started per MD on 3/12, and pharmacy has now been consulted to take over warfarin dosing.   INR now therapeutic but down from 2.8 to 2.2 today. The patient has been very sensitive to small warfarin dose changes. Had another episode of nausea/vomiting this AM. No bleeding issues noted. CBC and LDH stable.  Goal of Therapy: INR 2-2.5 Monitor platelets by anticoagulation protocol: Yes   Plan:  Warfarin 1mg  tonight Daily PT/INR  Monitor for signs and symptoms of bleeding  [name], PharmD, BCPS PGY2 Cardiology Pharmacy Resident Phone: [phone] 11/27/2020  2:12 PM  Please check [email or web address] for unit-specific pharmacy phone numbers.

## 2020-11-27 NOTE — Progress Notes (Signed)
Patient ID: Marcus Guaman., male   DOB: [birth year], 60 y.o.   MRN: [id number]     Advanced Heart Failure Rounding Note  PCP-Cardiologist: No primary care provider on file.   Subjective:    - 2/27 Milrinone switched to dobutamine 5 mcg.  - 2/28 Moved to ICU. Dobutamine increased to 7.5 mcg and Norepi 3 mcg added. Diuretics held.  - 3/1 Impella 5.5 placed.  - 3/4 Teeth removed - 3/10 HM3 LVAD placed - 3/13 VAD speed increased to 5500. [name] out  - 3/14 Nephrology consulted. Given 1UPRBCs  - 3/15 Started CRRT. Given 1UPRBCs.  - 3/23 VAD Speed turned down to 5300  - 3/23 CRRT and Milrinone Stopped. Back in Afib - 3/24 CRRT restarted.  - 3/27 CVVH off.  - 3/28 iHD - 3/29 iHD. Walked 75 feet!! - 3/20 Ramp Echo: Fixed speed: 5200 Low speed limit: 4900 - 4/3 S/P RIJ HD catheter.  - 4/10 60th B-day!  - 4/11 Transferred to CIR. Back on heparin drip.   INR 2.2, LDH 164.   Not dizzy today but had another episode of nausea/vomiting.  Feels fine now, going for PT.  Midodrine at 10 mg tid.   LVAD Interrogation HM 3: Speed: 5200 Flow: 4.4 PI: 4.0 Power: 3.8. VAD interrogated personally. Parameters stable.   Objective:   Weight Range: 81.6 kg Body mass index is 25.43 kg/m.   Vital Signs:   Temp:  [98 F (36.7 C)-99 F (37.2 C)] 98 F (36.7 C) (04/24 0445) Pulse Rate:  [55-87] 72 (04/24 0934) Resp:  [17-19] 18 (04/24 0445) BP: (83-131)/(56-95) 83/71 (04/24 0934) SpO2:  [95 %-100 %] 100 % (04/24 0445) Weight:  [81.6 kg] 81.6 kg (04/24 0445) Last BM Date: 11/25/20  Weight change: Filed Weights   11/26/20 0520 11/27/20 0445  Weight: 81 kg 81.6 kg    Intake/Output:   Intake/Output Summary (Last 24 hours) at 11/27/2020 1052 Last data filed at 11/26/2020 2100 Gross per 24 hour  Intake 260 ml  Output 1222 ml  Net -962 ml      Physical Exam  General: Well appearing this am. NAD.  HEENT: Normal. Neck: Supple, JVP 7-8 cm. Carotids OK.  Cardiac:  Mechanical heart  sounds with LVAD hum present.  Lungs:  CTAB, normal effort.  Abdomen:  NT, ND, no HSM. No bruits or masses. +BS  LVAD exit site: Well-healed and incorporated. Dressing dry and intact. No erythema or drainage. Stabilization device present and accurately applied. Driveline dressing changed daily per sterile technique. Extremities:  Warm and dry. No cyanosis, clubbing, rash, or edema.  Neuro:  Alert & oriented x 3. Cranial nerves grossly intact. Moves all 4 extremities w/o difficulty. Affect pleasant     Labs    CBC Recent Labs    11/26/20 0416 11/27/20 0410  WBC 8.9 9.3  HGB 8.6* 9.1*  HCT 29.2* 31.0*  MCV 90.1 89.6  PLT 171 295   Basic Metabolic Panel Recent Labs    11/26/20 0416 11/27/20 0410  NA 136 134*  K 4.2 4.3  CL 98 98  CO2 27 28  GLUCOSE 97 107*  BUN 41* 28*  CREATININE 5.93* 4.40*  CALCIUM 9.2 9.2  MG 2.1 2.1  PHOS 5.3* 3.8   Liver Function Tests Recent Labs    11/26/20 0416 11/27/20 0410  ALBUMIN 2.7* 2.6*   No results for input(s): LIPASE, AMYLASE in the last 72 hours. Cardiac Enzymes No results for input(s): CKTOTAL, CKMB, CKMBINDEX, TROPONINI in the last  72 hours.  BNP: BNP (last 3 results) Recent Labs    11/10/20 0150 11/17/20 0411 11/24/20 0454  BNP 905.1* 630.2* 723.8*    ProBNP (last 3 results) No results for input(s): PROBNP in the last 8760 hours.   D-Dimer No results for input(s): DDIMER in the last 72 hours. Hemoglobin A1C No results for input(s): HGBA1C in the last 72 hours. Fasting Lipid Panel No results for input(s): CHOL, HDL, LDLCALC, TRIG, CHOLHDL, LDLDIRECT in the last 72 hours. Thyroid Function Tests No results for input(s): TSH, T4TOTAL, T3FREE, THYROIDAB in the last 72 hours.  Invalid input(s): FREET3  Other results:   Imaging    No results found.   Medications:     Scheduled Medications: . (feeding supplement) PROSource Plus  30 mL Oral BID BM  . amiodarone  200 mg Oral Daily  . vitamin C  1,000 mg  Oral Daily  . Chlorhexidine Gluconate Cloth  6 each Topical Q0600  . Chlorhexidine Gluconate Cloth  6 each Topical Q0600  . Chlorhexidine Gluconate Cloth  6 each Topical Q0600  . darbepoetin (ARANESP) injection - DIALYSIS  100 mcg Intravenous Q Tue-HD  . feeding supplement (NEPRO CARB STEADY)  237 mL Oral BID BM  . gabapentin  100 mg Oral QHS  . guaiFENesin  600 mg Oral BID  . insulin aspart  0-15 Units Subcutaneous TID WC  . insulin aspart  0-5 Units Subcutaneous QHS  . insulin glargine  8 Units Subcutaneous Daily  . metoCLOPramide  5 mg Oral TID AC & HS  . midodrine  10 mg Oral TID WC  . multivitamin  1 tablet Oral QHS  . Muscle Rub   Topical BID  . pantoprazole  40 mg Oral Daily  . polyethylene glycol  17 g Oral BID  . rosuvastatin  10 mg Oral Daily  . scopolamine  1 patch Transdermal Q72H  . senna-docusate  2 tablet Oral BID  . sorbitol  30 mL Oral Daily  . Warfarin - Pharmacist Dosing Inpatient   Does not apply q1600    Infusions: . sodium chloride 10 mL (11/17/20 1430)  . sodium chloride    . sodium chloride      PRN Medications: sodium chloride, sodium chloride, sodium chloride, acetaminophen, alteplase, alteplase, alum & mag hydroxide-simeth, bisacodyl, dextrose, diphenhydrAMINE, guaiFENesin-dextromethorphan, heparin, heparin, lidocaine (PF), lidocaine-prilocaine, ondansetron (ZOFRAN) IV, oxyCODONE, pentafluoroprop-tetrafluoroeth, prochlorperazine **OR** prochlorperazine **OR** prochlorperazine, sodium chloride flush, sodium phosphate, traZODone, white petrolatum    Assessment/Plan   1.Acute on chronic systolic HF -> cardiogenic shock:ICM.Has[employer]tronic ICD. EF 15%. Low output persisted despite milrinone and then dobutamine with rise in creatinine to 2.5. Impella 5.5 placed 3/1.  Creatinine improved to 1.7 and HM3 LVAD was placed on 3/10. Developed post-op renal failure requiring CVVH. Now off milrinone and epinephrine. Last echo showed moderate RV enlargement with  mildly decreased RV function and VAD speed decreased to 5300 due to left-sided septum. CRRT stopped 3/23 but restarted 3/24. CVVH stopped again on 3/27. Ramp echo 3/30 with speed decreased to 5200. Tolerating iHD for volume management.  MAP 70s-80s. Midodrine increased to 10 mg tid due to dizzinness/orthostatic.  - Continue compression stockings.  - HD for volume management - Continue midodrine 10 tid.  2. LVAD: 3/13 VAD speed increased to 5500. 3/23 speed decreased to 5300 and decreased to 5200 on 3/30. VAD interrogated personally. LDH 164, INR 2.2 today  - On warfarin, on ASA 81 daily.  Discussed warfarin dosing with PharmD personally. - DL site ok 3.  CAD s/p [id number]:Last cath in 6/19 with patent grafts. - Continue Crestor 40 mg daily.  4. AKI on CKD Stage 3b: Suspect that this is a combination of cardiorenal syndrome and diabetic nephropathy and possibly ATN. Developed post-op AKI. Started on CRRT 3/15. CVVH stopped 3/23 but restarted 3/24. Now getting iHD, looks like he will be HD-dependent.  Has tunneled catheter.  - HD T/Th/S - Dr Augustin Coupe to talk with [facility], but with continuous flow LVAD, think AV fistula would be less likely to mature.  Will await decision by [facility] and nephrology in terms of access.  5. Atrial flutter/fibrillation: s/p DC-CV 08/21/18.  - Continue amio 200 mg daily.   6. Wound R Foot: Partial thickness skin loss noted. Cover with mepilex border and change every 5 days. Resolved.  7. Right arm pain: Likely neuropathic from stretch of brachial plexus with Impella placement.  - Continue gabapentin.  8. ID:  WBCs normal. Afebrile. Blood Cx- NGTD.  9. LS:LHTDSKA of very poor control but hgbA1ctrending down. Most recent 7.7%.  Insulin drip stopped 3/24. Blood glucose controlled on reduced Lantus and  sliding scale insulin to moderate.   - Diabetes coordinators have seen 10. Anemia: Has been stable. hgb 8.6 today.  11. Deconditioning - Continue to work with CIR 12. Unstageable  Pressure Ulcer on buttocks: Much improved 100% granulation.  - Hydrotherapy completed - Continue repositioning 13. NSVT: No longer on the monitor.  14. Dizziness: Still with symptoms that seem like BPPV at times. Has had Epley maneuvers.  15. Nausea/vomiting: periodically occurring overnight/in the early am. Continue symptomatic treatment.   Loralie Champagne 11/27/2020

## 2020-11-27 NOTE — Progress Notes (Signed)
PROGRESS NOTE   Subjective/Complaints: Had one episode of nausea yesterday but was able to hold it in. No nausea this morning. Slept most of the night "despite interruptions"  ROS: Patient denies fever, rash, sore throat, blurred vision,  vomiting, diarrhea, cough, shortness of breath or chest pain, joint or back pain, headache, or mood change.     Objective:   No results found. Recent Labs    11/26/20 0416 11/27/20 0410  WBC 8.9 9.3  HGB 8.6* 9.1*  HCT 29.2* 31.0*  PLT 171 171   Recent Labs    11/26/20 0416 11/27/20 0410  NA 136 134*  K 4.2 4.3  CL 98 98  CO2 27 28  GLUCOSE 97 107*  BUN 41* 28*  CREATININE 5.93* 4.40*  CALCIUM 9.2 9.2    Intake/Output Summary (Last 24 hours) at 11/27/2020 0753 Last data filed at 11/26/2020 2100 Gross per 24 hour  Intake 460 ml  Output 1223 ml  Net -763 ml     Pressure Injury 10/16/20 Coccyx Mid Stage 3 -  Full thickness tissue loss. Subcutaneous fat may be visible but bone, tendon or muscle are NOT exposed. Blister that has drained, 2 cm X 4.5 cm (Active)  10/16/20 0800  Location: Coccyx  Location Orientation: Mid  Staging: Stage 3 -  Full thickness tissue loss. Subcutaneous fat may be visible but bone, tendon or muscle are NOT exposed. (as per WOC note on 11/14/20)  Wound Description (Comments): Blister that has drained, 2 cm X 4.5 cm  Present on Admission: No     Pressure Injury 10/16/20 Coccyx Right Stage 2 -  Partial thickness loss of dermis presenting as a shallow open injury with a red, pink wound bed without slough. blister that has drained, 0.5 cm X 0.5 cm (Active)  10/16/20 0800  Location: Coccyx  Location Orientation: Right  Staging: Stage 2 -  Partial thickness loss of dermis presenting as a shallow open injury with a red, pink wound bed without slough.  Wound Description (Comments): blister that has drained, 0.5 cm X 0.5 cm  Present on Admission: No     Physical Exam: Vital Signs Blood pressure 113/87, pulse 85, temperature 98 F (36.7 C), resp. rate 18, height 5' 10.5" (1.791 m), weight 81.6 kg, SpO2 100 %. Constitutional: No distress . Vital signs reviewed. HEENT: EOMI, oral membranes moist Neck: supple Cardiovascular: LVAD hum    Respiratory/Chest: CTA Bilaterally without wheezes or rales. Normal effort    GI/Abdomen: BS +, non-tender, non-distended Ext: no clubbing, cyanosis, or edema Psych: pleasant and cooperative Skin: tunneled cath right shoulder in place. Right foot intact. Sacral wound  superficial with predominant granulation tissue.  LVAD site clean, dry Neuro: Pt is cognitively appropriate with normal insight and awareness.  STM deficits. Cranial nerves 2-12 are intact. Sensory exam is normal. Reflexes are 2+ in all 4's. Fine motor coordination is intact. R>L UE tremors mild today. Motor function is grossly 4/5 with tremor. RUE  Remains 3-4/5 but still limited d/t pain in shoulder. has scattered sensory loss throughout right arm. LE motor 3+ to 4/5 Musculoskeletal:  RUE with less pain during AROM   Assessment/Plan: 1. Functional deficits which  require 3+ hours per day of interdisciplinary therapy in a comprehensive inpatient rehab setting.  Physiatrist is providing close team supervision and 24 hour management of active medical problems listed below.  Physiatrist and rehab team continue to assess barriers to discharge/monitor patient progress toward functional and medical goals  Care Tool:  Bathing  Bathing activity did not occur:  (time constraints) Body parts bathed by patient: Left arm,Abdomen,Chest,Front perineal area,Right upper leg,Left upper leg,Face,Right arm,Buttocks   Body parts bathed by helper: Right lower leg,Left lower leg     Bathing assist Assist Level: Moderate Assistance - Patient 50 - 74%     Upper Body Dressing/Undressing Upper body dressing Upper body dressing/undressing activity did not  occur (including orthotics): Environmental limitations (PICC running) What is the patient wearing?: Pull over shirt    Upper body assist Assist Level: Minimal Assistance - Patient > 75%    Lower Body Dressing/Undressing Lower body dressing      What is the patient wearing?: Pants     Lower body assist Assist for lower body dressing: Moderate Assistance - Patient 50 - 74%     Toileting Toileting Toileting Activity did not occur Landscape architect and hygiene only): N/A (no void or bm)  Toileting assist Assist for toileting: Dependent - Patient 0%     Transfers Chair/bed transfer  Transfers assist  Chair/bed transfer activity did not occur: Safety/medical concerns  Chair/bed transfer assist level: Moderate Assistance - Patient 50 - 74% Chair/bed transfer assistive device: Programmer, multimedia   Ambulation assist   Ambulation activity did not occur: Safety/medical concerns  Assist level: Contact Guard/Touching assist Assistive device: Walker-rolling Max distance: 30   Walk 10 feet activity   Assist  Walk 10 feet activity did not occur: Safety/medical concerns  Assist level: Contact Guard/Touching assist Assistive device: Walker-rolling   Walk 50 feet activity   Assist Walk 50 feet with 2 turns activity did not occur: Safety/medical concerns  Assist level: Contact Guard/Touching assist Assistive device: Walker-rolling    Walk 150 feet activity   Assist Walk 150 feet activity did not occur: Safety/medical concerns         Walk 10 feet on uneven surface  activity   Assist Walk 10 feet on uneven surfaces activity did not occur: Safety/medical concerns         Wheelchair     Assist Will patient use wheelchair at discharge?: No (Per PT long term goals)   Wheelchair activity did not occur: Safety/medical concerns         Wheelchair 50 feet with 2 turns activity    Assist    Wheelchair 50 feet with 2 turns activity  did not occur: Safety/medical concerns       Wheelchair 150 feet activity     Assist  Wheelchair 150 feet activity did not occur: Safety/medical concerns       Blood pressure 113/87, pulse 85, temperature 98 F (36.7 C), resp. rate 18, height 5' 10.5" (1.791 m), weight 81.6 kg, SpO2 100 %.  Medical Problem List and Plan: 1.Functional and mobility deficitssecondary to debility after acute on chronic CHF with subsequent LVAD placement -patient maynotshower -ELOS/Goals: 5/2  -Continue CIR therapies including PT, OT    -pt with STM deficits  - scopolamine patch for ?vestibular symptom relief    -  3 flights of stairs to enter home 2. Chronic systolic CHF s/p LVAD 3:  -DVT/anticoagulation:Pharmaceutical:Continue Coumadin per pharmacy -antiplatelet therapy: ASA 3.Right arm neuropathic pain/brachial plexus injury/Pain Management:Tylenol or  oxycodone prn.  -- gabapentin 200 mgat bedtime with some benefit -sling prn for RUE support and comfort  -expect recovery of motor/sensory function in RUE  4. Mood:LCSW to follow for evaluation and support. -antipsychotic agents: N/A 5. Neuropsych: This patientiscapable of making decisions on hisown behalf. 6. Skin/Wound Care:Optimize nutritional status--continue Nephro and prosource.  --Sacral decubitus: Air mattress.    -appears much improved-->continue WTD dressing   -pt is NO LONGER RECEIVING HYDRO --Right foot wound resolved.  7. Fluids/Electrolytes/Nutrition:Strict I/O. Continue to monitor labs with HD? 8. Chronic systolic CHF s/p LVAD 3:  --Cardiology to follow and manage cardiac issues.  -daily weights Filed Weights   11/26/20 0520 11/27/20 0445  Weight: 81 kg 81.6 kg    4/24 bp/volume mgt per nephro and cards.    -off revatio   -TEDS, acclimation   -increased midodrine to 10mg  tid  4/22 9. Acute on chronic anemia: Continue to monitor H/H/ monitor for signs of bleeding.  --Was transfused with multiple units PRBC. --ON Nulecit Tu/Th/Sa  -transfuse as needed in HD hgb holding at 8.6 10. T2DM: Hgb A1c- monitor BS ac/hs.BS reasonable on current intake --continue Lantus in am with SSI for elevated BS.   4/23-24   improved after lantus decreased to 8u qam   -may need to readjust as he's eating more CBG (last 3)  Recent Labs    11/26/20 1732 11/26/20 2100 11/27/20 0613  GLUCAP 134* 175* 98    11. ESRD: Hyperkalemia being managed with lokelma/HD. -Scheduled HD at the end of the day to help with tolerance of activity during the day. --RD following 12. Mild leucocytosis: Resolved  13. A fib: NSR, HR remains controlled on Amiodarone, continue 14. Nausea: seems better  -- added mucinex with benefit for cough trigger  -last bm 4/22   -on miralax and senna-s bid  -hs dose of reglan added    LOS: 13 days A FACE TO FACE EVALUATION WAS PERFORMED  Meredith Staggers 11/27/2020, 7:53 AM

## 2020-11-28 LAB — RENAL FUNCTION PANEL
Albumin: 2.6 g/dL — ABNORMAL LOW (ref 3.5–5.0)
Anion gap: 10 (ref 5–15)
BUN: 43 mg/dL — ABNORMAL HIGH (ref 6–20)
CO2: 27 mmol/L (ref 22–32)
Calcium: 9.3 mg/dL (ref 8.9–10.3)
Chloride: 98 mmol/L (ref 98–111)
Creatinine, Ser: 5.67 mg/dL — ABNORMAL HIGH (ref 0.61–1.24)
GFR, Estimated: 11 mL/min — ABNORMAL LOW (ref >60)
Glucose, Bld: 100 mg/dL — ABNORMAL HIGH (ref 70–99)
Phosphorus: 5 mg/dL — ABNORMAL HIGH (ref 2.5–4.6)
Potassium: 4.2 mmol/L (ref 3.5–5.1)
Sodium: 135 mmol/L (ref 135–145)

## 2020-11-28 LAB — PROTIME-INR
INR: 2.2 — ABNORMAL HIGH (ref 0.8–1.2)
Prothrombin Time: 24.1 seconds — ABNORMAL HIGH (ref 11.4–15.2)

## 2020-11-28 LAB — GLUCOSE, CAPILLARY
Glucose-Capillary: 83 mg/dL (ref 70–99)
Glucose-Capillary: 139 mg/dL — ABNORMAL HIGH (ref 70–99)
Glucose-Capillary: 100 mg/dL — ABNORMAL HIGH (ref 70–99)
Glucose-Capillary: 133 mg/dL — ABNORMAL HIGH (ref 70–99)

## 2020-11-28 LAB — MAGNESIUM: Magnesium: 2.3 mg/dL (ref 1.7–2.4)

## 2020-11-28 LAB — LACTATE DEHYDROGENASE: LDH: 168 U/L (ref 98–192)

## 2020-11-28 MED ORDER — WARFARIN SODIUM 2 MG PO TABS
2.0000 mg | ORAL_TABLET | Freq: Once | ORAL | Status: AC
  Administered 2020-11-28: 2 mg via ORAL
  Filled 2020-11-28: qty 1

## 2020-11-28 MED ORDER — CHLORHEXIDINE GLUCONATE CLOTH 2 % EX PADS
6.0000 | MEDICATED_PAD | Freq: Two times a day (BID) | CUTANEOUS | Status: DC
  Administered 2020-11-28 – 2020-12-09 (×21): 6 via TOPICAL

## 2020-11-28 MED ORDER — WARFARIN SODIUM 3 MG PO TABS: 3.0000 mg | ORAL_TABLET | Freq: Once | ORAL | Status: DC

## 2020-11-28 MED ORDER — CHLORHEXIDINE GLUCONATE CLOTH 2 % EX PADS
6.0000 | MEDICATED_PAD | Freq: Every day | CUTANEOUS | Status: DC
  Administered 2020-11-28 – 2020-12-01 (×3): 6 via TOPICAL

## 2020-11-28 NOTE — Progress Notes (Signed)
PROGRESS NOTE   Subjective/Complaints: No complaints this morning Does appear sleepy, closing eyes several times during conversation  ROS: Patient denies fever, rash, sore throat, blurred vision,  vomiting, diarrhea, cough, shortness of breath or chest pain, joint or back pain, headache, or mood change.     Objective:   No results found. Recent Labs    11/26/20 0416 11/27/20 0410  WBC 8.9 9.3  HGB 8.6* 9.1*  HCT 29.2* 31.0*  PLT 171 171   Recent Labs    11/27/20 0410 11/28/20 0436  NA 134* 135  K 4.3 4.2  CL 98 98  CO2 28 27  GLUCOSE 107* 100*  BUN 28* 43*  CREATININE 4.40* 5.67*  CALCIUM 9.2 9.3    Intake/Output Summary (Last 24 hours) at 11/28/2020 1159 Last data filed at 11/28/2020 0715 Gross per 24 hour  Intake 843 ml  Output 50 ml  Net 793 ml     Pressure Injury 10/16/20 Coccyx Mid Stage 3 -  Full thickness tissue loss. Subcutaneous fat may be visible but bone, tendon or muscle are NOT exposed. Blister that has drained, 2 cm X 4.5 cm (Active)  10/16/20 0800  Location: Coccyx  Location Orientation: Mid  Staging: Stage 3 -  Full thickness tissue loss. Subcutaneous fat may be visible but bone, tendon or muscle are NOT exposed. (as per WOC note on 11/14/20)  Wound Description (Comments): Blister that has drained, 2 cm X 4.5 cm  Present on Admission: No     Pressure Injury 10/16/20 Coccyx Right Stage 2 -  Partial thickness loss of dermis presenting as a shallow open injury with a red, pink wound bed without slough. blister that has drained, 0.5 cm X 0.5 cm (Active)  10/16/20 0800  Location: Coccyx  Location Orientation: Right  Staging: Stage 2 -  Partial thickness loss of dermis presenting as a shallow open injury with a red, pink wound bed without slough.  Wound Description (Comments): blister that has drained, 0.5 cm X 0.5 cm  Present on Admission: No    Physical Exam: Vital Signs Blood pressure  100/81, pulse 81, temperature 98.5 F (36.9 C), resp. rate 16, height 5' 10.5" (1.791 m), weight 80.9 kg, SpO2 99 %. Gen: no distress, normal appearing HEENT: oral mucosa pink and moist, NCAT Cardio: Reg rate Chest: normal effort, normal rate of breathing Abd: soft, non-distended Ext: no edema Psych: pleasant, normal affect Skin: tunneled cath right shoulder in place. Right foot intact. Sacral wound  superficial with predominant granulation tissue.  LVAD site clean, dry Neuro: Pt is cognitively appropriate with normal insight and awareness.  STM deficits. Cranial nerves 2-12 are intact. Sensory exam is normal. Reflexes are 2+ in all 4's. Fine motor coordination is intact. R>L UE tremors mild today. Motor function is grossly 4/5 with tremor. RUE  Remains 3-4/5 but still limited d/t pain in shoulder. has scattered sensory loss throughout right arm. LE motor 3+ to 4/5 Musculoskeletal:  RUE with less pain during AROM   Assessment/Plan: 1. Functional deficits which require 3+ hours per day of interdisciplinary therapy in a comprehensive inpatient rehab setting.  Physiatrist is providing close team supervision and 24 hour management of  active medical problems listed below.  Physiatrist and rehab team continue to assess barriers to discharge/monitor patient progress toward functional and medical goals  Care Tool:  Bathing  Bathing activity did not occur:  (time constraints) Body parts bathed by patient: Left arm,Abdomen,Chest,Front perineal area,Right upper leg,Left upper leg,Face,Right arm,Buttocks   Body parts bathed by helper: Right lower leg,Left lower leg     Bathing assist Assist Level: Moderate Assistance - Patient 50 - 74%     Upper Body Dressing/Undressing Upper body dressing Upper body dressing/undressing activity did not occur (including orthotics): Environmental limitations (PICC running) What is the patient wearing?: Pull over shirt    Upper body assist Assist Level: Minimal  Assistance - Patient > 75%    Lower Body Dressing/Undressing Lower body dressing      What is the patient wearing?: Pants     Lower body assist Assist for lower body dressing: Moderate Assistance - Patient 50 - 74%     Toileting Toileting Toileting Activity did not occur Landscape architect and hygiene only): N/A (no void or bm)  Toileting assist Assist for toileting: Dependent - Patient 0%     Transfers Chair/bed transfer  Transfers assist  Chair/bed transfer activity did not occur: Safety/medical concerns  Chair/bed transfer assist level: Moderate Assistance - Patient 50 - 74% Chair/bed transfer assistive device: Programmer, multimedia   Ambulation assist   Ambulation activity did not occur: Safety/medical concerns  Assist level: Contact Guard/Touching assist Assistive device: Walker-rolling Max distance: 135 ft   Walk 10 feet activity   Assist  Walk 10 feet activity did not occur: Safety/medical concerns  Assist level: Contact Guard/Touching assist Assistive device: Walker-rolling   Walk 50 feet activity   Assist Walk 50 feet with 2 turns activity did not occur: Safety/medical concerns  Assist level: Contact Guard/Touching assist Assistive device: Walker-rolling    Walk 150 feet activity   Assist Walk 150 feet activity did not occur: Safety/medical concerns         Walk 10 feet on uneven surface  activity   Assist Walk 10 feet on uneven surfaces activity did not occur: Safety/medical concerns         Wheelchair     Assist Will patient use wheelchair at discharge?: No (Per PT long term goals)   Wheelchair activity did not occur: Safety/medical concerns         Wheelchair 50 feet with 2 turns activity    Assist    Wheelchair 50 feet with 2 turns activity did not occur: Safety/medical concerns       Wheelchair 150 feet activity     Assist  Wheelchair 150 feet activity did not occur: Safety/medical  concerns       Blood pressure 100/81, pulse 81, temperature 98.5 F (36.9 C), resp. rate 16, height 5' 10.5" (1.791 m), weight 80.9 kg, SpO2 99 %.  Medical Problem List and Plan: 1.Functional and mobility deficitssecondary to debility after acute on chronic CHF with subsequent LVAD placement -patient maynotshower -ELOS/Goals: 5/2  -Continue CIR therapies including PT, OT    -pt with STM deficits  - scopolamine patch for ?vestibular symptom relief    -  3 flights of stairs to enter home 2. Chronic systolic CHF s/p LVAD 3:  -DVT/anticoagulation:Pharmaceutical:Continue Coumadin per pharmacy -antiplatelet therapy: ASA 3.Right arm neuropathic pain/brachial plexus injury/Pain Management:Tylenol or oxycodone prn.  -- gabapentin 200 mgat bedtime with some benefit -sling prn for RUE support and comfort  -expect recovery of motor/sensory function in  RUE  4/25 well controlled, continue current regimen.  4. Mood:LCSW to follow for evaluation and support. -antipsychotic agents: N/A 5. Neuropsych: This patientiscapable of making decisions on hisown behalf. 6. Skin/Wound Care:Optimize nutritional status--continue Nephro and prosource.  --Sacral decubitus: Air mattress.    -appears much improved-->continue WTD dressing   -pt is NO LONGER RECEIVING HYDRO --Right foot wound resolved.  7. Fluids/Electrolytes/Nutrition:Strict I/O. Continue to monitor labs with HD? 8. Chronic systolic CHF s/p LVAD 3:  --Cardiology to follow and manage cardiac issues.  -4/25 weight down, continue to monitor Filed Weights   11/26/20 0520 11/27/20 0445 11/28/20 0420  Weight: 81 kg 81.6 kg 80.9 kg    4/24 bp/volume mgt per nephro and cards.    -off revatio   -TEDS, acclimation   -increased midodrine to 10mg  tid 4/22 9. Acute on chronic anemia: Continue to monitor H/H/ monitor  for signs of bleeding.  --Was transfused with multiple units PRBC. --ON Nulecit Tu/Th/Sa  -transfuse as needed in HD hgb holding at 8.6 10. T2DM: Hgb A1c- monitor BS ac/hs.BS reasonable on current intake --continue Lantus in am with SSI for elevated BS.   4/23-25   improved after lantus decreased to 8u qam   -may need to readjust as he's eating more CBG (last 3)  Recent Labs    11/27/20 1636 11/27/20 2132 11/28/20 0625  GLUCAP 122* 131* 83    11. ESRD: Hyperkalemia being managed with lokelma/HD. -Scheduled HD at the end of the day to help with tolerance of activity during the day. --RD following 12. Mild leucocytosis: Resolved  13. A fib: NSR, HR remains controlled on Amiodarone, continue 14. Nausea: seems better  -- added mucinex with benefit for cough trigger  -last bm 4/22   -on miralax and senna-s bid  -hs dose of reglan added    LOS: 14 days A FACE TO FACE EVALUATION WAS PERFORMED  Marcus Johnson 11/28/2020, 11:59 AM

## 2020-11-28 NOTE — Progress Notes (Signed)
Occupational Therapy Session Note  Patient Details  Name: Marcus Johnson. MRN: [id number] Date of Birth: 1961/03/13  Today's Date: 11/28/2020 OT Individual Time: 1330-1400 OT Individual Time Calculation (min): 30 min    Short Term Goals: Week 2:  OT Short Term Goal 1 (Week 2): Pt will complete toilet transfer with supervision OT Short Term Goal 2 (Week 2): Pt will complete LB dressing with CGA OT Short Term Goal 3 (Week 2): Pt will appropriately instruct a caregiver to change LVAD to battery power  Skilled Therapeutic Interventions/Progress Updates:    Pt received in bed ready for therapy. Pt sat to EOB with min A to avoid pushing with arms too much.  From EOB stood with support from RW with min -mod A and then with min A stepped to arm chair.  From arm chair worked on various UE/LE AROM and isometric exercises. Pt needed some assist to power up from arm chair (heavy mod ) then pivoted back to sit EOB. Moved back to supine to rest with all needs met.    Therapy Documentation Precautions:  Precautions Precautions: Fall,Sternal,Other (comment) Precaution Comments: LVAD, sacral wound, Left BPPV Required Braces or Orthoses: Other Brace Other Brace: thumb loop and buddy strap R hand Restrictions Weight Bearing Restrictions: Yes RUE Weight Bearing:  (sternal) Other Position/Activity Restrictions: sternal precautions    Vital Signs: Therapy Vitals Temp: 98 F (36.7 C) Temp Source: Oral Pulse Rate: 73 Resp: 18 BP: 105/88 Patient Position (if appropriate): Lying Oxygen Therapy SpO2: 96 % O2 Device: Room Air Pain: No c/o pain    Therapy/Group: Individual Therapy  Cole 11/28/2020, 4:35 PM

## 2020-11-28 NOTE — Progress Notes (Addendum)
Patient ID: Marcus Johnson., male   DOB: Jan 26, 1961, 60 y.o.   MRN: [id number]     Advanced Heart Failure Rounding Note  PCP-Cardiologist: No primary care provider on file.   Subjective:    - 2/27 Milrinone switched to dobutamine 5 mcg.  - 2/28 Moved to ICU. Dobutamine increased to 7.5 mcg and Norepi 3 mcg added. Diuretics held.  - 3/1 Impella 5.5 placed.  - 3/4 Teeth removed - 3/10 HM3 LVAD placed - 3/13 VAD speed increased to 5500. Marcus Johnson out  - 3/14 Nephrology consulted. Given 1UPRBCs  - 3/15 Started CRRT. Given 1UPRBCs.  - 3/23 VAD Speed turned down to 5300  - 3/23 CRRT and Milrinone Stopped. Back in Afib - 3/24 CRRT restarted.  - 3/27 CVVH off.  - 3/28 iHD - 3/29 iHD. Walked 75 feet!! - 3/20 Ramp Echo: Fixed speed: 5200 Low speed limit: 4900 - 4/3 S/P RIJ HD catheter.  - 4/10 60th B-day!  - 4/11 Transferred to CIR. Back on heparin drip.   INR 2.2, LDH 168  Frustrated today. Says he is working hard but the VAD doesn't last forever and this worries him.      LVAD Interrogation HM 3: Speed: 5200 Flow: 4.1 PI: 5.1 Power: 3.7  Rare PI events. Marland Kitchen VAD interrogated personally. Parameters stable.   Objective:   Weight Range: 80.9 kg Body mass index is 25.24 kg/m.   Vital Signs:   Temp:  [98 F (36.7 C)-98.5 F (36.9 C)] 98.5 F (36.9 C) (04/25 0420) Pulse Rate:  [81-90] 81 (04/25 0420) Resp:  [16] 16 (04/25 0420) BP: (97-104)/(71-88) 100/81 (04/25 0941) SpO2:  [97 %-99 %] 99 % (04/25 0420) Weight:  [80.9 kg] 80.9 kg (04/25 0420) Last BM Date: 11/25/20  Weight change: Filed Weights   11/26/20 0520 11/27/20 0445 11/28/20 0420  Weight: 81 kg 81.6 kg 80.9 kg    Intake/Output:   Intake/Output Summary (Last 24 hours) at 11/28/2020 0948 Last data filed at 11/28/2020 0715 Gross per 24 hour  Intake 843 ml  Output 50 ml  Net 793 ml      Physical Exam  Physical Exam: GENERAL: No acute distress. HEENT: normal  NECK: Supple, JVP 5-6  .  2+ bilaterally,  no bruits.  No lymphadenopathy or thyromegaly appreciated.   CARDIAC:  Mechanical heart sounds with LVAD hum present.  LUNGS:  Clear to auscultation bilaterally.  ABDOMEN:  Soft, round, nontender, positive bowel sounds x4.     LVAD exit site:  Dressing dry and intact.  No erythema or drainage.  Stabilization device present and accurately applied.  Driveline dressing is being changed daily per sterile technique. EXTREMITIES:  Warm and dry, no cyanosis, clubbing, rash or edema  NEUROLOGIC:  Alert and oriented x 3.    No aphasia.  No dysarthria.  Affect flat .      Labs    CBC Recent Labs    11/26/20 0416 11/27/20 0410  WBC 8.9 9.3  HGB 8.6* 9.1*  HCT 29.2* 31.0*  MCV 90.1 89.6  PLT 171 867   Basic Metabolic Panel Recent Labs    11/27/20 0410 11/28/20 0436  NA 134* 135  K 4.3 4.2  CL 98 98  CO2 28 27  GLUCOSE 107* 100*  BUN 28* 43*  CREATININE 4.40* 5.67*  CALCIUM 9.2 9.3  MG 2.1 2.3  PHOS 3.8 5.0*   Liver Function Tests Recent Labs    11/27/20 0410 11/28/20 0436  ALBUMIN 2.6* 2.6*   No  results for input(s): LIPASE, AMYLASE in the last 72 hours. Cardiac Enzymes No results for input(s): CKTOTAL, CKMB, CKMBINDEX, TROPONINI in the last 72 hours.  BNP: BNP (last 3 results) Recent Labs    11/10/20 0150 11/17/20 0411 11/24/20 0454  BNP 905.1* 630.2* 723.8*    ProBNP (last 3 results) No results for input(s): PROBNP in the last 8760 hours.   D-Dimer No results for input(s): DDIMER in the last 72 hours. Hemoglobin A1C No results for input(s): HGBA1C in the last 72 hours. Fasting Lipid Panel No results for input(s): CHOL, HDL, LDLCALC, TRIG, CHOLHDL, LDLDIRECT in the last 72 hours. Thyroid Function Tests No results for input(s): TSH, T4TOTAL, T3FREE, THYROIDAB in the last 72 hours.  Invalid input(s): FREET3  Other results:   Imaging    No results found.   Medications:     Scheduled Medications:  (feeding supplement) PROSource Plus  30 mL Oral  BID BM   amiodarone  200 mg Oral Daily   vitamin C  1,000 mg Oral Daily   Chlorhexidine Gluconate Cloth  6 each Topical Q0600   Chlorhexidine Gluconate Cloth  6 each Topical BID   darbepoetin (ARANESP) injection - DIALYSIS  100 mcg Intravenous Q Tue-HD   feeding supplement (NEPRO CARB STEADY)  237 mL Oral BID BM   gabapentin  100 mg Oral QHS   guaiFENesin  600 mg Oral BID   insulin aspart  0-15 Units Subcutaneous TID WC   insulin aspart  0-5 Units Subcutaneous QHS   insulin glargine  8 Units Subcutaneous Daily   metoCLOPramide  5 mg Oral TID AC & HS   midodrine  10 mg Oral TID WC   multivitamin  1 tablet Oral QHS   Muscle Rub   Topical BID   pantoprazole  40 mg Oral Daily   polyethylene glycol  17 g Oral BID   rosuvastatin  10 mg Oral Daily   scopolamine  1 patch Transdermal Q72H   senna-docusate  2 tablet Oral BID   sorbitol  30 mL Oral Daily   warfarin  3 mg Oral ONCE-1600   Warfarin - Pharmacist Dosing Inpatient   Does not apply q1600    Infusions:  sodium chloride 10 mL (11/17/20 1430)   sodium chloride     sodium chloride      PRN Medications: sodium chloride, sodium chloride, sodium chloride, acetaminophen, alteplase, alteplase, alum & mag hydroxide-simeth, bisacodyl, dextrose, diphenhydrAMINE, guaiFENesin-dextromethorphan, heparin, heparin, lidocaine (PF), lidocaine-prilocaine, ondansetron (ZOFRAN) IV, oxyCODONE, pentafluoroprop-tetrafluoroeth, prochlorperazine **OR** prochlorperazine **OR** prochlorperazine, sodium chloride flush, sodium phosphate, traZODone, white petrolatum    Assessment/Plan   1. Acute on chronic systolic HF -> cardiogenic shock:  ICM. Has [employer] ICD. EF 15%. Low output persisted despite milrinone and then dobutamine with rise in creatinine to 2.5. Impella 5.5 placed 3/1.  Creatinine improved to 1.7 and HM3 LVAD was placed on 3/10. Developed post-op renal failure requiring CVVH. Now off milrinone and epinephrine. Last echo showed moderate RV  enlargement with mildly decreased RV function and VAD speed decreased to 5300 due to left-sided septum. CRRT stopped 3/23 but restarted 3/24. CVVH stopped again on 3/27. Ramp echo 3/30 with speed decreased to 5200. Tolerating iHD for volume management.  MAP 80-90s  Midodrine increased to 10 mg tid due to dizzinness/orthostatic.  - Continue compression stockings.  - HD for volume management - Continue midodrine 10 tid.  2. LVAD: 3/13 VAD speed increased to 5500. 3/23 speed decreased to 5300 and decreased to 5200 on 3/30. VAD  interrogated personally. LDH 168  INR 2.2 today  - On warfarin, on ASA 81 daily.  Discussed warfarin dosing with PharmD personally. - DL site ok 3. CAD s/p CABG 2002:  Last cath in 6/19 with patent grafts. - Continue Crestor 40 mg daily.  4. AKI on CKD Stage 3b: Suspect that this is a combination of cardiorenal syndrome and diabetic nephropathy and possibly ATN. Developed post-op AKI. Started on CRRT 3/15. CVVH stopped 3/23 but restarted 3/24. Now getting iHD, looks like he will be HD-dependent.  Has tunneled catheter.  - HD T/Th/S - Dr Augustin Coupe to talk with [facility], but with continuous flow LVAD, think AV fistula would be less likely to mature.  Will await decision by [facility] and nephrology in terms of access.  5. Atrial flutter/fibrillation: s/p DC-CV 08/21/18.  - Continue amio 200 mg daily.   6. Wound R Foot: Partial thickness skin loss noted. Cover with mepilex border and change every 5 days. Resolved.  7. Right arm pain: Likely neuropathic from stretch of brachial plexus with Impella placement.  - Continue gabapentin. Able to move right hand a little better.  8. ID:  WBCs normal. Afebrile. Blood Cx- NGTD.  9. DM: History of very poor control but hgbA1c trending down. Most recent 7.7%.  Insulin drip stopped 3/24. Blood glucose controlled on reduced Lantus and  sliding scale insulin to moderate.   - Diabetes coordinators have seen 10. Anemia: check CBC tomorrow.  11. Deconditioning -  Continue to work with CIR 12. Unstageable Pressure Ulcer on buttocks: Much improved 100% granulation.  - Hydrotherapy completed - Continue repositioning 13. NSVT: No longer on the monitor.  14. Dizziness: Still with symptoms that seem like BPPV at times. Has had Epley maneuvers.  15. Nausea/vomiting: periodically occurring overnight/in the early am. Continue symptomatic treatment. On reglan. ?Gastroparesis.   Amy Clegg NP-C  11/28/2020  Agree with NP note above.   Continue to work with rehab.  No changes to plan today from my standpoint.  LVAD parameters stable.   Loralie Champagne 11/28/2020

## 2020-11-28 NOTE — Progress Notes (Signed)
Occupational Therapy Session Note  Patient Details  Name: Marcus Johnson. MRN: [id number] Date of Birth: [birth year]  Today's Date: 11/28/2020 OT Individual Time: 3545-6256 OT Individual Time Calculation (min): 63 min    Short Term Goals: Week 2:  OT Short Term Goal 1 (Week 2): Pt will complete toilet transfer with supervision OT Short Term Goal 2 (Week 2): Pt will complete LB dressing with CGA OT Short Term Goal 3 (Week 2): Pt will appropriately instruct a caregiver to change LVAD to battery power  Skilled Therapeutic Interventions/Progress Updates:    Pt supine with c/o soreness in his bottom from laying too long. He was agreeable to OT session. Lotion applied to B feet for hygiene/skin care. Teds and socks donned total A. Bed mobility completed with CGA to EOB. Sit > stand with min A and for stand pivot to w/c with RW. Pt completed grooming tasks at the sink with min cueing for initiation. Peri hygiene completed in standing with min A provided for thoroughness. New incontinence brief donned in standing. Pt was able to give instruction for switching LVAD from wall to battery power with min cueing. He was taken via w/c to the therapy gym. He completed reciprocal stepping activity focused on increasing functional activity tolerance for carryover to 3 flights of stairs he must ascend to access his apartment. 15, 20, and 30 steps completed 1st, 2nd, & 3rd trial, respectively. Pt required extended rest break between each trial. One episode of feeling nauseas but no emesis.  Pt returned to his room and was left sitting up in the w/c, on wall power, with all needs met.   Therapy Documentation Precautions:  Precautions Precautions: Fall,Sternal,Other (comment) Precaution Comments: LVAD, sacral wound, Left BPPV Required Braces or Orthoses: Other Brace Other Brace: thumb loop and buddy strap R hand Restrictions Weight Bearing Restrictions: Yes RUE Weight Bearing:  (sternal) Other  Position/Activity Restrictions: sternal precautions  Therapy/Group: Individual Therapy  Curtis Sites 11/28/2020, 6:27 AM

## 2020-11-28 NOTE — Plan of Care (Signed)
  Problem: Consults Goal: RH GENERAL PATIENT EDUCATION Description: See Patient Education module for education specifics. Outcome: Progressing Goal: Skin Care Protocol Initiated - if Braden Score 18 or less Description: If consults are not indicated, leave blank or document N/A Outcome: Progressing Goal: Diabetes Guidelines if Diabetic/Glucose > 140 Description: If diabetic or lab glucose is > 140 mg/dl - Initiate Diabetes/Hyperglycemia Guidelines & Document Interventions  Outcome: Progressing   Problem: RH SKIN INTEGRITY Goal: RH STG SKIN FREE OF INFECTION/BREAKDOWN Description: Skin to remain free from additional breakdown or infection with mod I assist. Outcome: Progressing Goal: RH STG MAINTAIN SKIN INTEGRITY WITH ASSISTANCE Description: STG Maintain Skin Integrity With supervision Assistance. Outcome: Progressing Goal: RH STG ABLE TO PERFORM INCISION/WOUND CARE W/ASSISTANCE Description: STG Able To Perform Incision/Wound Care With supervision Assistance. Outcome: Progressing   Problem: RH SAFETY Goal: RH STG ADHERE TO SAFETY PRECAUTIONS W/ASSISTANCE/DEVICE Description: STG Adhere to Safety Precautions With supervision Assistance/Device. Outcome: Progressing   Problem: RH PAIN MANAGEMENT Goal: RH STG PAIN MANAGED AT OR BELOW PT'S PAIN GOAL Description: <4 on a 0-10 pain scale. Outcome: Progressing   Problem: RH KNOWLEDGE DEFICIT GENERAL Goal: RH STG INCREASE KNOWLEDGE OF SELF CARE AFTER HOSPITALIZATION Description: Patient will demonstrate knowledge of medication management, dietary restrictions, fluid restrictions, skin/wound care, and dressing changes with educational materials and handouts provided by staff, at discharge with independence. Outcome: Progressing

## 2020-11-28 NOTE — Progress Notes (Signed)
ANTICOAGULATION CONSULT NOTE - Follow Up Consult  Pharmacy Consult for Warfarin Indication: LVAD  Allergies  Allergen Reactions  . Meclizine Hcl Anaphylaxis and Swelling  . Ivabradine Nausea Only    Patient Measurements: Height: 5' 10.5" (179.1 cm) Weight: 80.9 kg (178 lb 6.4 oz) IBW/kg (Calculated) : 74.15 kg  Vital Signs: Temp: 98.5 F (36.9 C) (04/25 0420) BP: 102/88 (04/25 0420) Pulse Rate: 81 (04/25 0420)  Labs: Recent Labs    11/26/20 0416 11/27/20 0410 11/28/20 0436  HGB 8.6* 9.1*  --   HCT 29.2* 31.0*  --   PLT 171 171  --   LABPROT 29.7* 24.6* 24.1*  INR 2.8* 2.2* 2.2*  CREATININE 5.93* 4.40* 5.67*    Estimated Creatinine Clearance: 14.5 mL/min (A) (by C-G formula based on SCr of 5.67 mg/dL (H)).   Assessment: 60 yo male s/p new LVAD implant with HeartMate III on 3/10. Warfarin started per MD on 3/12, and pharmacy has now been consulted to take over warfarin dosing.   INR now therapeutic but down 2.1 today. The patient has been very sensitive to small warfarin dose changes. Continues to struggle with daily nausea/vomiting. No bleeding issues noted. CBC and LDH stable.  Goal of Therapy: INR 2-2.5 Monitor platelets by anticoagulation protocol: Yes   Plan:  Warfarin 3mg  tonight Daily PT/INR  Monitor for signs and symptoms of bleeding  Erin Hearing PharmD., BCPS Clinical Pharmacist 11/28/2020 9:35 AM  Please check [email or web address] for unit-specific pharmacy phone numbers.

## 2020-11-28 NOTE — Progress Notes (Signed)
TCTS Subjective:  Feeling better overall. Feels like he is doing well with rehab therapies. His butt is feeling better, less pain.  His main complaint is of nausea and vomiting daily. Constipated.   Making a small volume of urine daily. Remains on HD.  Objective: Vital signs in last 24 hours: Temp:  [98 F (36.7 C)-98.5 F (36.9 C)] 98.5 F (36.9 C) (04/25 0420) Pulse Rate:  [72-90] 81 (04/25 0420) Resp:  [16] 16 (04/25 0420) BP: (83-104)/(71-88) 102/88 (04/25 0420) SpO2:  [97 %-99 %] 99 % (04/25 0420) Weight:  [80.9 kg] 80.9 kg (04/25 0420)  Hemodynamic parameters for last 24 hours:    Intake/Output from previous day: 04/24 0701 - 04/25 0700 In: 643 [P.O.:640; I.V.:3] Out: 50 [Urine:50] Intake/Output this shift: Total I/O In: 200 [P.O.:200] Out: -   General appearance: alert and cooperative Neurologic: right hand numbness and weakness Heart: regular rate and rhythm Lungs: clear to auscultation bilaterally Abdomen: + BS soft and nontender Chest incision healing well. Few small dry eschars. Chest tube sites healed.  Lab Results: Recent Labs    11/26/20 0416 11/27/20 0410  WBC 8.9 9.3  HGB 8.6* 9.1*  HCT 29.2* 31.0*  PLT 171 171   BMET:  Recent Labs    11/27/20 0410 11/28/20 0436  NA 134* 135  K 4.3 4.2  CL 98 98  CO2 28 27  GLUCOSE 107* 100*  BUN 28* 43*  CREATININE 4.40* 5.67*  CALCIUM 9.2 9.3    PT/INR:  Recent Labs    11/28/20 0436  LABPROT 24.1*  INR 2.2*   ABG    Component Value Date/Time   PHART 7.565 (H) 10/22/2020 0439   HCO3 25.1 10/22/2020 0439   TCO2 26 10/22/2020 0439   ACIDBASEDEF 1.0 10/19/2020 0324   O2SAT 46.9 11/14/2020 1313   CBG (last 3)  Recent Labs    11/27/20 1636 11/27/20 2132 11/28/20 0625  GLUCAP 122* 131* 83    Assessment/Plan:  Overall he is making good progress with rehab. Healing up from surgery.  Nausea and vomiting is a persistent problem and not sure what that is about. I don't remember him  having that while in the acute care hospital.  Question if it is medication related, such as amiodarone, or related to renal failure/HD, constipation, diabetic gastroparesis or combination of them all.   LOS: 14 days    Gaye Pollack 11/28/2020

## 2020-11-28 NOTE — Progress Notes (Signed)
Physical Therapy Session Note  Patient Details  Name: Marcus Johnson. MRN: [id number] Date of Birth: 1961-01-22  Today's Date: 11/28/2020 PT Individual Time: 1112-1210 PT Individual Time Calculation (min): 58 min   Short Term Goals: Week 2:  PT Short Term Goal 1 (Week 2): Patient will perform basic transfers with CGA using LRAD consistently. PT Short Term Goal 2 (Week 2): Patient will consistently ambulate >50 ft using LRAD with CGA. PT Short Term Goal 3 (Week 2): Patient will initiate stair training.  Skilled Therapeutic Interventions/Progress Updates:     Session 1: Patient in w/c with LVAD hooked up to the monitor upon PT arrival. Patient alert and agreeable to PT session. Patient reported 5/10 sacral pain during session, RN made aware. PT provided repositioning, rest breaks, and distraction as pain interventions throughout session. Switched patient to battery packs at beginning of session with total A for time/energy management due to upper extremity coordination deficits.   Therapeutic Activity: Bed Mobility: Patient performed sit to supine with supervision-mod I with min use of bed rail.  Transfers: Patient performed sit to/from stand x4 with CGA using RW, and x1 without RW. Provided verbal cues for forward weight shift and reaching back to sit with intermittent hand-over-hand to find arm rest.  Gait Training:  Patient ambulated 71 feet and 156 feet using RW with CGA and w/c follow for safety, progressed to min A and end of second trial due to lower extremity fatigue setting off mild jerking movement.. Ambulated with decreased gait speed, decreased step length and height with minimal trunk rotation or visual scanning and slight veer to the R. Provided verbal cues for pacing, paced breathing, and improved visual scanning for visual and spatial awareness. Patient ascended/descended 4-6" steps using B rails with CGA. Performed step-to gait pattern leading with R while ascending  and L while descending. Provided cues for technique and pacing. Patient reported mild "lightheadedness" on descent with small lower extremity jerking movements. MAP 84 after using Doppler.   Therapeutic Exercise: Patient performed the following exercises with verbal and tactile cues for proper technique. -seated marching x2 min for warm up prior to standing mobility, educated on performing while seated in the w/c -w/c push-ups 2x5, educated on use of this technique for pressure relief every 30 min when seated in the chair to reduce sacral pain and skin breakdown -alternating step-taps x10 as warm up for stair training  Patient in bed in R side-lying for sacral pressure relief, returned to LVAD monitor with all batteries charging at end of session with breaks locked and all needs within reach.   Session 2: Patient in bed upon PT arrival. Patient alert and agreeable to PT session. Patient denied pain during session. Reported 1 bout of dizziness when turning in the bed during session, otherwise asymptomatic.   Switched patient from the LVAD monitor to the battery packs with total A for time/energy management due to upper extremity coordination deficits. Patient continues to be able to state sequencing correctly without cues.   Therapeutic Activity: Bed Mobility: Patient performed supine to sit with min A-CGA for trunk support and sit to supine with supervision in a flat bed without use of bed rails. Provided verbal cues for setting bottom elbow to push up to sitting from side-lying. Transfers: Patient performed sit to/from stand x2 with min A from low surface and CGA from w/c. Provided verbal cues for scooting forward, forward weight shift, and reaching back to sit x1.  Gait Training:  Patient ambulated 165  feet x2 using RW as above with reduced knee instability. Took 3 brief standing rest breaks on first trial to breath or regroup, 5-10 sec, and no standing rest breaks on the second trial Provided  verbal cues as above.  Therapeutic Exercise: Patient performed the following exercises with verbal and tactile cues for proper technique. -heel slides and straight leg raises in the bed as warm up for OOB mobility  Discussed patient's symptoms of dizziness, no recurrent patterns of movement set it off, can be spontaneous, and no nystagmus noted in >1 week, does move his eyes around and unable to focus when it occurs. Symptoms more consistent with central dizziness rather than peripheral. Will continue to monitor symptoms, but improving overall.   Patient required a seated rest break between gait trials. Discussed home set up, patient sits in a kitchen chair, couch, and recliner at home and gets in/out of the L side of the bed. Discussed energy conservation for stairs on dialysis days and encouraged him to have a back-up plan of somewhere he can stay for a night on dialysis days if he does not feel strong enough to make it up the steps. Discussed use of a shower chair for seated rest breaks and safety on the steps. Will trial at a later time with patient.   Patient in bed hooked up to the LVAD monitor at end of session with breaks locked, 4 rails up per patient request, and all needs within reach.    Therapy Documentation Precautions:  Precautions Precautions: Fall,Sternal,Other (comment) Precaution Comments: LVAD, sacral wound, Left BPPV Required Braces or Orthoses: Other Brace Other Brace: thumb loop and buddy strap R hand Restrictions Weight Bearing Restrictions: Yes RUE Weight Bearing:  (sternal) Other Position/Activity Restrictions: sternal precautions   Therapy/Group: Individual Therapy  Deetya Drouillard L Vendela Troung PT, DPT  11/28/2020, 12:15 PM

## 2020-11-28 NOTE — Progress Notes (Signed)
Patient ID: Marcus Johnson., male   DOB: 05-31-1961, 60 y.o.   MRN: [id number]  SW spoke with pt son [name] [phone]) to discuss HHA preference. SW asked if he and father could review list, and inform SW so pt can have HHA in place at time of d/c. SW to follow-up tomorrow after team conference with updates.   Loralee Pacas, MSW, [place] Office: [phone] Cell: [phone] Fax: [phone]

## 2020-11-28 NOTE — Progress Notes (Signed)
[facility] NEPHROLOGY PROGRESS NOTE  Assessment/ Plan: Pt is a 60 y.o. yo male systolic CHF with EF of 66%, has ICD, LVAD AKI required CRRT initially now on intermittent hemodialysis.  #Acute kidney injury on CKD due to cardiorenal syndrome.  Initially required CRRT and then transition to intermittent hemodialysis since 3/28.  No sign of renal recovery so far and tolerating dialysis.  Status post TDC placed by IR on 11/07/2020. Last HD on 4/23, plan for next HD tomorrow.  Renal navigator is following for outpatient HD center arrangement.    #Acute on chronic systolic heart failure with cardiogenic shock: Has ICD and LVAD.  Volume management by dialysis.  Cardiology is following.  Also on midodrine.  #Anemia of CKD and critical illness: Hemoglobin is stable.  Continue Aranesp.  #CKD-MBD: Calcium and phosphorus level acceptable.  Monitor lab.  Subjective: Seen and examined.  No new event.  Denies nausea vomiting chest pain shortness of breath. Objective Vital signs in last 24 hours: Vitals:   11/27/20 1247 11/27/20 1912 11/28/20 0420 11/28/20 0941  BP: 97/75 104/71 102/88 100/81  Pulse: 90 86 81   Resp:  16 16   Temp: 98 F (36.7 C) 98.1 F (36.7 C) 98.5 F (36.9 C)   TempSrc: Oral     SpO2: 97% 98% 99%   Weight:   80.9 kg   Height:       Weight change: -0.635 kg  Intake/Output Summary (Last 24 hours) at 11/28/2020 1008 Last data filed at 11/28/2020 0715 Gross per 24 hour  Intake 843 ml  Output 50 ml  Net 793 ml       Labs: Basic Metabolic Panel: Recent Labs  Lab 11/26/20 0416 11/27/20 0410 11/28/20 0436  NA 136 134* 135  K 4.2 4.3 4.2  CL 98 98 98  CO2 27 28 27   GLUCOSE 97 107* 100*  BUN 41* 28* 43*  CREATININE 5.93* 4.40* 5.67*  CALCIUM 9.2 9.2 9.3  PHOS 5.3* 3.8 5.0*   Liver Function Tests: Recent Labs  Lab 11/26/20 0416 11/27/20 0410 11/28/20 0436  ALBUMIN 2.7* 2.6* 2.6*   No results for input(s): LIPASE, AMYLASE in the last 168  hours. No results for input(s): AMMONIA in the last 168 hours. CBC: Recent Labs  Lab 11/23/20 0400 11/24/20 0454 11/25/20 0445 11/26/20 0416 11/27/20 0410  WBC 8.9 9.7 7.9 8.9 9.3  HGB 8.7* 8.6* 8.6* 8.6* 9.1*  HCT 28.8* 28.2* 28.7* 29.2* 31.0*  MCV 89.7 89.0 90.0 90.1 89.6  PLT 151 172 140* 171 171   Cardiac Enzymes: No results for input(s): CKTOTAL, CKMB, CKMBINDEX, TROPONINI in the last 168 hours. CBG: Recent Labs  Lab 11/27/20 0613 11/27/20 1129 11/27/20 1636 11/27/20 2132 11/28/20 0625  GLUCAP 98 128* 122* 131* 83    Iron Studies: No results for input(s): IRON, TIBC, TRANSFERRIN, FERRITIN in the last 72 hours. Studies/Results: No results found.  Medications: Infusions: . sodium chloride 10 mL (11/17/20 1430)  . sodium chloride    . sodium chloride      Scheduled Medications: . (feeding supplement) PROSource Plus  30 mL Oral BID BM  . amiodarone  200 mg Oral Daily  . vitamin C  1,000 mg Oral Daily  . Chlorhexidine Gluconate Cloth  6 each Topical Q0600  . Chlorhexidine Gluconate Cloth  6 each Topical BID  . darbepoetin (ARANESP) injection - DIALYSIS  100 mcg Intravenous Q Tue-HD  . feeding supplement (NEPRO CARB STEADY)  237 mL Oral BID BM  .  gabapentin  100 mg Oral QHS  . guaiFENesin  600 mg Oral BID  . insulin aspart  0-15 Units Subcutaneous TID WC  . insulin aspart  0-5 Units Subcutaneous QHS  . insulin glargine  8 Units Subcutaneous Daily  . metoCLOPramide  5 mg Oral TID AC & HS  . midodrine  10 mg Oral TID WC  . multivitamin  1 tablet Oral QHS  . Muscle Rub   Topical BID  . pantoprazole  40 mg Oral Daily  . polyethylene glycol  17 g Oral BID  . rosuvastatin  10 mg Oral Daily  . scopolamine  1 patch Transdermal Q72H  . senna-docusate  2 tablet Oral BID  . sorbitol  30 mL Oral Daily  . warfarin  2 mg Oral ONCE-1600  . Warfarin - Pharmacist Dosing Inpatient   Does not apply q1600    have reviewed scheduled and prn medications.  Physical  Exam: General:NAD, comfortable Heart: Mechanical heart sound Lungs:clear b/l, no crackle Abdomen:soft, Non-tender, non-distended Extremities:No edema Dialysis Access: IJ TDC.  Marcus Johnson Marcus Johnson Marcus Johnson 11/28/2020,10:08 AM  LOS: 14 days

## 2020-11-28 NOTE — Progress Notes (Signed)
Patient ID: Marcus Johnson., male   DOB: Nov 26, 1960, 60 y.o.   MRN: [id number]  SW received  Updates from [place] that pt was pre-certified for transportation services.   Loralee Pacas, MSW, [place] Office: [phone] Cell: [phone] Fax: [phone]

## 2020-11-28 NOTE — Progress Notes (Addendum)
LVAD Coordinator Rounding Note:  HM III LVAD implanted on 10/13/20 by Dr Cyndia Bent under Destination Therapy criteria.  Discharged from [facility] and [facility] to [facility] 11/14/20.  Pt lying in bed this morning having his picc line dressing changed by IV nurse. Pt states that he has not had PT/OT yet this morning but is scheduled for 0930. Pt states that he did well with therapy over the weekend. He states that he is eating well and usually cleans his plate.   Per OT memory assessment pt found to have mild short term memory deficits.   No family at bedside for dressing change education.   Pt has HD scheduled on Tues/Thurs/Sat. Tolerating well. Tunneled catheter in place.   Vital signs: Temp: 98.5 HR: 81 Doppler: 100 Auto cuff: 102/88 (94) O2 Sat: 99% on RA Wt: 183.6>190.9>194>185.4>188.6>184.2>181.6>176.3>178.4 lbs    LVAD interrogation reveals:  Speed: 5200 Flow: 4.3  Power: 3.7w PI: 4.1 Hematocrit: 28  Alarms: none Events: none today; 5 yesterday  Fixed speed: 5200 Low speed limit: 4900  Drive Line: Existing VAD dressing removed and site care performed using sterile technique. Drive line exit site cleaned with Chlora prep applicators x 2, acell placed in driveline site along with sterile ky jelly, and gauze dressing placed over the site. Site continues to improve. Exit site healing and partially incorporated, the velour is fully implanted at exit site Small amount of brown drainage. No redness, tenderness, foul odor or rash noted. Drive line anchor re-applied. Continue twice weekly dressing changes using acell. Next dressing change will be due 12/01/20. No family available for dressing change.       Labs:  LDH trend: 199>191>196>180>174>168>182>177>287>168  INR trend: 1.5>1.7>1.9>2.4>3.2>2.9>3.2>2.9>2.2  Anticoagulation Plan: -INR Goal: 2.0-2.5 -ASA Dose: stopped 30 days post op per protocol  Device:  -  Medtronic single -  Pacing: VVI 40 - Therapies: on 200  bpm  Plan/Recommendations:  1. Call VAD coordinator for equipment or drive line issues 2. Twice weekly dressing changes while using acell. Next dressing due 12/01/20.  Tanda Rockers RN [place] Coordinator  Office: [phone]  24/7 Pager: [phone]

## 2020-11-29 LAB — CBC
HCT: 30.3 % — ABNORMAL LOW (ref 39.0–52.0)
Hemoglobin: 9.1 g/dL — ABNORMAL LOW (ref 13.0–17.0)
MCH: 26.5 pg (ref 26.0–34.0)
MCHC: 30 g/dL (ref 30.0–36.0)
MCV: 88.3 fL (ref 80.0–100.0)
Platelets: 198 10*3/uL (ref 150–400)
RBC: 3.43 MIL/uL — ABNORMAL LOW (ref 4.22–5.81)
RDW: 16.8 % — ABNORMAL HIGH (ref 11.5–15.5)
WBC: 9.7 10*3/uL (ref 4.0–10.5)
nRBC: 0 % (ref 0.0–0.2)

## 2020-11-29 LAB — GLUCOSE, CAPILLARY
Glucose-Capillary: 95 mg/dL (ref 70–99)
Glucose-Capillary: 132 mg/dL — ABNORMAL HIGH (ref 70–99)
Glucose-Capillary: 63 mg/dL — ABNORMAL LOW (ref 70–99)
Glucose-Capillary: 70 mg/dL (ref 70–99)
Glucose-Capillary: 127 mg/dL — ABNORMAL HIGH (ref 70–99)

## 2020-11-29 LAB — RENAL FUNCTION PANEL
Albumin: 2.7 g/dL — ABNORMAL LOW (ref 3.5–5.0)
Anion gap: 14 (ref 5–15)
BUN: 55 mg/dL — ABNORMAL HIGH (ref 6–20)
CO2: 26 mmol/L (ref 22–32)
Calcium: 9.5 mg/dL (ref 8.9–10.3)
Chloride: 96 mmol/L — ABNORMAL LOW (ref 98–111)
Creatinine, Ser: 6.72 mg/dL — ABNORMAL HIGH (ref 0.61–1.24)
GFR, Estimated: 9 mL/min — ABNORMAL LOW (ref >60)
Glucose, Bld: 117 mg/dL — ABNORMAL HIGH (ref 70–99)
Phosphorus: 5.3 mg/dL — ABNORMAL HIGH (ref 2.5–4.6)
Potassium: 4.7 mmol/L (ref 3.5–5.1)
Sodium: 136 mmol/L (ref 135–145)

## 2020-11-29 LAB — ECHOCARDIOGRAM LIMITED
Height: 70.5 in
Weight: 2836 oz

## 2020-11-29 LAB — PROTIME-INR
INR: 2.1 — ABNORMAL HIGH (ref 0.8–1.2)
Prothrombin Time: 24 seconds — ABNORMAL HIGH (ref 11.4–15.2)

## 2020-11-29 LAB — MAGNESIUM: Magnesium: 2.4 mg/dL (ref 1.7–2.4)

## 2020-11-29 LAB — LACTATE DEHYDROGENASE: LDH: 163 U/L (ref 98–192)

## 2020-11-29 MED ORDER — WARFARIN SODIUM 3 MG PO TABS
3.0000 mg | ORAL_TABLET | Freq: Once | ORAL | Status: AC
  Administered 2020-11-29: 3 mg via ORAL
  Filled 2020-11-29: qty 1

## 2020-11-29 MED ORDER — METOCLOPRAMIDE HCL 5 MG PO TABS
10.0000 mg | ORAL_TABLET | Freq: Three times a day (TID) | ORAL | Status: DC
  Administered 2020-11-29 – 2020-12-09 (×37): 10 mg via ORAL
  Filled 2020-11-29 (×42): qty 2

## 2020-11-29 MED ORDER — DARBEPOETIN ALFA 100 MCG/0.5ML IJ SOSY
PREFILLED_SYRINGE | INTRAMUSCULAR | Status: AC
  Administered 2020-11-29: 100 ug via INTRAVENOUS
  Filled 2020-11-29: qty 0.5

## 2020-11-29 MED ORDER — HEPARIN SODIUM (PORCINE) 1000 UNIT/ML IJ SOLN
INTRAMUSCULAR | Status: AC
  Administered 2020-11-29: 1000 [IU] via INTRAVENOUS_CENTRAL
  Filled 2020-11-29: qty 4

## 2020-11-29 NOTE — Progress Notes (Signed)
Physical Therapy Session Note  Patient Details  Name: Marcus Johnson. MRN: [id number] Date of Birth: 1961-05-29  Today's Date: 11/29/2020 PT Individual Time: 8341-9622 PT Individual Time Calculation (min): 57 min   Short Term Goals: Week 3:  PT Short Term Goal 1 (Week 3): STG=LTG due to ELOS.  Skilled Therapeutic Interventions/Progress Updates:    Pt received supine in bed and agreeable to therapy session stating he is feeling better compared to this AM. NT in/out to assess blood sugar and then RN in/out for medication administration. Supine>sitting R EOB, using bedrails as needed with supervision for safety. Sitting EOB donned shoes max assist for time management. Therapist had pt instruct therapist through connecting LVAD to battery power to assess his knowledge and pt correctly able to instruct with increased time (also able to recall need to take extra battery bag, which therapist prepared). L stand pivot EOB>w/c, no AD, with CGA for steadying. Gait training ~125ft to ADL apartment using RW with CGA - towards end of gait pt started to demonstrate B LE "shaking" with him reporting some LE fatigue but he was able to safely ambulate to recliner - overall demonstrates decreased gait speed with decreased B LE step lengths, foot clearances, and decreased speed of movement. Performed functional furniture transfer to/from [place] with pt requiring min assist for boosting to stand on 1st try and then CGA on 2nd - pt demos good form but decreased power and muscle recruitment through B LEs. Gait training ~168ft to main therapy gym using RW with CGA - gait deviations as above - therapist providing w/c follow for safety in event of fatigue during all gait training.   Repeated sit<>stands to/from lower EOM, no UE support, x3 reps with min assist for initial boosting to come to stand. Pt continued to require min assist to come to stand from this lower surface due to decreased muscle recruitment  to power up through LEs.  Dynamic standing balance task, no UE support, via R/L reciprocal heel taps on/off external targets on top of 4" step - required min assist for balance. Dynamic standing balance and B LE strengthening via repeated R/L step up/down on/off 4" step without UE support - min assist for balance and pt demoing good B LE muscle recruitment to control stepping up/down with no knee instability. Gait training ~14ft back towards room using RW with CGA, continued to require w/c follow in event of fatigue - continues to demo above gait deviations therefore therapist cued for increased gait speed with pt demoing improved coordination of movement with decreased "shakiness" though resulted in quicker fatigue and pt unable to ambulate full distance back to room. Pt transported remainder of distance back to room due to fatigue. Therapist transitioned LVAD back to wall power (off of battery power). Pt left sitting in w/c with needs in reach, lines intact, and seat belt alarm on.  Therapy Documentation Precautions:  Precautions Precautions: Fall,Sternal,Other (comment) Precaution Comments: LVAD, sacral wound, Left BPPV Required Braces or Orthoses: Other Brace Other Brace: thumb loop and buddy strap R hand Restrictions Weight Bearing Restrictions: No RUE Weight Bearing:  (sternal) Other Position/Activity Restrictions: sternal precautions  Pain:   No reports of pain throughout session.  Therapy/Group: Individual Therapy  Tawana Scale , PT, DPT, CSRS  11/29/2020, 8:04 AM

## 2020-11-29 NOTE — Progress Notes (Addendum)
[facility] Progress Note    Assessment/ Plan:    Acute kidney injury on chronic kidney disease due to cardiorenal syndrome: nitially required CRRT and then transition to intermittent hemodialysis since 3/28.  No sign of renal recovery so far and tolerating dialysis.  Status post TDC placed by IR on 11/07/2020. Last HD on 4/23. HD today.  Renal navigator is following for outpatient HD center arrangement.     Acute exacerbation of chronic systolic heart failure with cardiogenic shock:LVAD, placed 3/10. Volume status managed by dialysis.  On midodrine and warfarin.  Cardiology following   Anemia of CKD and critical illness:Stable. Continue Aranesp.  Renal osteodystrophy: Phosphorous 5.3  Atrial fibrillation: per cardiology, on amiodarone  Protein calorie malnutrition, hypoalbuminemia: push protein, per primary  DM II: per primary  Dispo: patient able to do HD in chair; working on setting up Outpatient dialysis Subjective:   Patient states he is having a bad morning as he has been vomiting.  He believes his vomiting is due to the Reglan.    Objective:   BP (!) 113/96   Pulse 83   Temp 98.3 F (36.8 C)   Resp 19   Ht 5' 10.5" (1.791 m)   Wt 81.6 kg   SpO2 93%   BMI 25.46 kg/m   Intake/Output Summary (Last 24 hours) at 11/29/2020 0920 Last data filed at 11/29/2020 0840 Gross per 24 hour  Intake 300 ml  Output 50 ml  Net 250 ml   Weight change: 0.726 kg  Physical Exam: Gen: Alert talking on the phone, no acute distress CVS: LVAD mechanical hum, regular rate, RUE dialysis catheter  Resp: Clear auscultation bilaterally, no increased work of breathing Abd: Soft, nontender Ext: No lower extremity edema  Imaging: No results found.  Labs: BMET Recent Labs  Lab 11/23/20 0400 11/24/20 0454 11/25/20 0445 11/26/20 0416 11/27/20 0410 11/28/20 0436 11/29/20 0413  NA 136 137 135 136 134* 135 136  K 4.4 4.6 4.1 4.2 4.3 4.2 4.7  CL 96* 99 97*  98 98 98 96*  CO2 27 27 28 27 28 27 26   GLUCOSE 75 86 71 97 107* 100* 117*  BUN 38* 54* 25* 41* 28* 43* 55*  CREATININE 5.14* 6.66* 4.34* 5.93* 4.40* 5.67* 6.72*  CALCIUM 9.3 9.3 9.2 9.2 9.2 9.3 9.5  PHOS 5.2* 6.0* 4.3 5.3* 3.8 5.0* 5.3*   CBC Recent Labs  Lab 11/25/20 0445 11/26/20 0416 11/27/20 0410 11/29/20 0413  WBC 7.9 8.9 9.3 9.7  HGB 8.6* 8.6* 9.1* 9.1*  HCT 28.7* 29.2* 31.0* 30.3*  MCV 90.0 90.1 89.6 88.3  PLT 140* 171 171 198    Medications:    . (feeding supplement) PROSource Plus  30 mL Oral BID BM  . vitamin C  1,000 mg Oral Daily  . Chlorhexidine Gluconate Cloth  6 each Topical Q0600  . Chlorhexidine Gluconate Cloth  6 each Topical BID  . Chlorhexidine Gluconate Cloth  6 each Topical Q0600  . darbepoetin (ARANESP) injection - DIALYSIS  100 mcg Intravenous Q Tue-HD  . feeding supplement (NEPRO CARB STEADY)  237 mL Oral BID BM  . gabapentin  100 mg Oral QHS  . guaiFENesin  600 mg Oral BID  . insulin aspart  0-15 Units Subcutaneous TID WC  . insulin aspart  0-5 Units Subcutaneous QHS  . insulin glargine  8 Units Subcutaneous Daily  . metoCLOPramide  10 mg Oral TID AC & HS  . midodrine  10 mg Oral TID WC  .  multivitamin  1 tablet Oral QHS  . Muscle Rub   Topical BID  . pantoprazole  40 mg Oral Daily  . polyethylene glycol  17 g Oral BID  . rosuvastatin  10 mg Oral Daily  . scopolamine  1 patch Transdermal Q72H  . senna-docusate  2 tablet Oral BID  . sorbitol  30 mL Oral Daily  . warfarin  3 mg Oral ONCE-1600  . Warfarin - Pharmacist Dosing Inpatient   Does not apply [place], DO 11/29/2020, 9:20 AM

## 2020-11-29 NOTE — Progress Notes (Signed)
Occupational Therapy Session Note  Patient Details  Name: Marcus Johnson. MRN: [id number] Date of Birth: 1960/10/24  Today's Date: 11/29/2020 OT Individual Time: 4235-3614 OT Individual Time Calculation (min): 60 min    Short Term Goals: Week 2:  OT Short Term Goal 1 (Week 2): Pt will complete toilet transfer with supervision OT Short Term Goal 2 (Week 2): Pt will complete LB dressing with CGA OT Short Term Goal 3 (Week 2): Pt will appropriately instruct a caregiver to change LVAD to battery power  Skilled Therapeutic Interventions/Progress Updates:    Pt sitting EOB reporting feeling "woozy" but no other c/o pain. Pt eager to get OOB. MAP EOB 89. He completed sit > stand and stand pivot transfer to w/c with min A using RW. At the sink he initiated grooming tasks and became suddenly very sick- vomiting and dry heaving several times. RN alerted. Pt settled back down and MAP was assessed- 78. Question accuracy of MAP d/t worse jerking this session giving a lot of feedback through doppler. Pt reported dizziness was subsiding and he completed sit > stand at the sink for peri hygiene and brief change. Pt able to complete peri hygiene with min A. He then completed endurance based marching activity standing at the sink with intermittent two and then one UE support. Pt reported dizziness was now 8/10. He was encouraged to return to bed. He was able to transfer with CGA back to supine, including stand pivot without AD. From supine he completed B coordination and [facility] task to simulate LVAD management. Pt had intermittent reports of dizziness getting better/worse. Pt was left supine with all needs met.   Therapy Documentation Precautions:  Precautions Precautions: Fall,Sternal,Other (comment) Precaution Comments: LVAD, sacral wound, Left BPPV Required Braces or Orthoses: Other Brace Other Brace: thumb loop and buddy strap R hand Restrictions Weight Bearing Restrictions: No RUE Weight Bearing:   (sternal) Other Position/Activity Restrictions: sternal precautions   Therapy/Group: Individual Therapy  Curtis Sites 11/29/2020, 6:44 AM

## 2020-11-29 NOTE — Progress Notes (Signed)
Occupational Therapy Weekly Progress Note  Patient Details  Name: Marcus Johnson. MRN: [id number] Date of Birth: 09-01-1960  Beginning of progress report period: November 22, 2020 End of progress report period: November 29, 2020  Patient has met 1 of 3 short term goals.  Pt continues to have inconsistent performance in therapy. He is often limited by fatigue, nausea and dizziness. He is often performing as well as min-CGA with ADLs, although he still requires assist to reach to his feet. Pt is able to provide caregiver instructions for LVAD management but unable to manage lines himself. Pt has a supportive brother and son who he lives with and are willing to provide care.   Patient continues to demonstrate the following deficits: muscle weakness, decreased problem solving and delayed processing and decreased sitting balance, decreased standing balance, decreased postural control and decreased balance strategies and therefore will continue to benefit from skilled OT intervention to enhance overall performance with BADL.  Patient progressing toward long term goals..  Continue plan of care.  OT Short Term Goals Week 2:  OT Short Term Goal 1 (Week 2): Pt will complete toilet transfer with supervision OT Short Term Goal 1 - Progress (Week 2): Not met OT Short Term Goal 2 (Week 2): Pt will complete LB dressing with CGA OT Short Term Goal 2 - Progress (Week 2): Not met OT Short Term Goal 3 (Week 2): Pt will appropriately instruct a caregiver to change LVAD to battery power OT Short Term Goal 3 - Progress (Week 2): Met Week 3:  OT Short Term Goal 1 (Week 3): Continue working toward established LTG   Curtis Sites 11/29/2020, 1:03 PM

## 2020-11-29 NOTE — Progress Notes (Signed)
Renal Navigator faxed updated progress notes to [facility] for review.  Will request first shift on Thursday for HD per CIR CSW request, so that he can have therapy in the afternoon to evaluate how well he tolerates mobility training after dialysis.  Navigator continues to follow closely.   Alphonzo Cruise, [place] Renal Navigator [phone]

## 2020-11-29 NOTE — Progress Notes (Signed)
Hypoglycemic Event  CBG: 63 at 1849 after returning from hemodialysis.  Treatment: Gave juice, graham crackers, peanut butter.   Symptoms: hunger, shaking  Follow-up CBG: Time:1920 CBG Result: 70  Possible Reasons for Event: Nausea/ vomiting this morning, unable to keep down breakfast and half of lunch. Comments/MD notified: On call PA notified.   Angie Fava

## 2020-11-29 NOTE — Progress Notes (Signed)
PROGRESS NOTE   Subjective/Complaints: Had significant nausea and vomiting after eating breakfast this morning. OT at bedside, pt eating some crackers, nausea now subsiding  ROS: Patient denies fever, rash, sore throat, blurred vision,   diarrhea, cough, shortness of breath or chest pain, joint or back pain, headache, or mood change.    Objective:   No results found. Recent Labs    11/27/20 0410 11/29/20 0413  WBC 9.3 9.7  HGB 9.1* 9.1*  HCT 31.0* 30.3*  PLT 171 198   Recent Labs    11/28/20 0436 11/29/20 0413  NA 135 136  K 4.2 4.7  CL 98 96*  CO2 27 26  GLUCOSE 100* 117*  BUN 43* 55*  CREATININE 5.67* 6.72*  CALCIUM 9.3 9.5    Intake/Output Summary (Last 24 hours) at 11/29/2020 0913 Last data filed at 11/29/2020 0840 Gross per 24 hour  Intake 300 ml  Output 50 ml  Net 250 ml     Pressure Injury 10/16/20 Coccyx Mid Stage 3 -  Full thickness tissue loss. Subcutaneous fat may be visible but bone, tendon or muscle are NOT exposed. Blister that has drained, 2 cm X 4.5 cm (Active)  10/16/20 0800  Location: Coccyx  Location Orientation: Mid  Staging: Stage 3 -  Full thickness tissue loss. Subcutaneous fat may be visible but bone, tendon or muscle are NOT exposed. (as per WOC note on 11/14/20)  Wound Description (Comments): Blister that has drained, 2 cm X 4.5 cm  Present on Admission: No     Pressure Injury 10/16/20 Coccyx Right Stage 2 -  Partial thickness loss of dermis presenting as a shallow open injury with a red, pink wound bed without slough. blister that has drained, 0.5 cm X 0.5 cm (Active)  10/16/20 0800  Location: Coccyx  Location Orientation: Right  Staging: Stage 2 -  Partial thickness loss of dermis presenting as a shallow open injury with a red, pink wound bed without slough.  Wound Description (Comments): blister that has drained, 0.5 cm X 0.5 cm  Present on Admission: No    Physical  Exam: Vital Signs Blood pressure (!) 113/96, pulse 83, temperature 98.3 F (36.8 C), resp. rate 19, height 5' 10.5" (1.791 m), weight 81.6 kg, SpO2 93 %. Constitutional: sl distress. Vital signs reviewed. HEENT: EOMI, oral membranes moist Neck: supple Cardiovascular: RRR without murmur. No JVD    Respiratory/Chest: CTA Bilaterally without wheezes or rales. Normal effort    GI/Abdomen: BS +, non-tender, non-distended Ext: no clubbing, cyanosis, or edema Psych: pleasant and cooperative Skin: tunneled cath right shoulder in place.LVAD site clean, sacral wound with granulation tissue Neuro: Pt is cognitively appropriate with normal insight and awareness.  STM deficits. Cranial nerves 2-12 are intact. Sensory exam is normal. Reflexes are 2+ in all 4's. Fine motor coordination is intact. R>L UE tremors mild today. Motor function is grossly 4/5 with tremor. RUE  Remains 3-4/5 but still limited d/t pain in shoulder. has scattered sensory loss throughout right arm. LE motor 3+ to 4/5 Musculoskeletal:  RUE moving more freely   Assessment/Plan: 1. Functional deficits which require 3+ hours per day of interdisciplinary therapy in a comprehensive inpatient rehab  setting.  Physiatrist is providing close team supervision and 24 hour management of active medical problems listed below.  Physiatrist and rehab team continue to assess barriers to discharge/monitor patient progress toward functional and medical goals  Care Tool:  Bathing  Bathing activity did not occur:  (time constraints) Body parts bathed by patient: Left arm,Abdomen,Chest,Front perineal area,Right upper leg,Left upper leg,Face,Right arm,Buttocks   Body parts bathed by helper: Right lower leg,Left lower leg     Bathing assist Assist Level: Moderate Assistance - Patient 50 - 74%     Upper Body Dressing/Undressing Upper body dressing Upper body dressing/undressing activity did not occur (including orthotics): Environmental limitations  (PICC running) What is the patient wearing?: Pull over shirt    Upper body assist Assist Level: Minimal Assistance - Patient > 75%    Lower Body Dressing/Undressing Lower body dressing      What is the patient wearing?: Pants     Lower body assist Assist for lower body dressing: Moderate Assistance - Patient 50 - 74%     Toileting Toileting Toileting Activity did not occur Landscape architect and hygiene only): N/A (no void or bm)  Toileting assist Assist for toileting: Dependent - Patient 0%     Transfers Chair/bed transfer  Transfers assist  Chair/bed transfer activity did not occur: Safety/medical concerns  Chair/bed transfer assist level: Contact Guard/Touching assist Chair/bed transfer assistive device: Programmer, multimedia   Ambulation assist   Ambulation activity did not occur: Safety/medical concerns  Assist level: Contact Guard/Touching assist Assistive device: Walker-rolling Max distance: 165 ft   Walk 10 feet activity   Assist  Walk 10 feet activity did not occur: Safety/medical concerns  Assist level: Contact Guard/Touching assist Assistive device: Walker-rolling   Walk 50 feet activity   Assist Walk 50 feet with 2 turns activity did not occur: Safety/medical concerns  Assist level: Contact Guard/Touching assist Assistive device: Walker-rolling    Walk 150 feet activity   Assist Walk 150 feet activity did not occur: Safety/medical concerns  Assist level: Contact Guard/Touching assist Assistive device: Walker-rolling    Walk 10 feet on uneven surface  activity   Assist Walk 10 feet on uneven surfaces activity did not occur: Safety/medical concerns         Wheelchair     Assist Will patient use wheelchair at discharge?: No (Per PT long term goals)   Wheelchair activity did not occur: Safety/medical concerns         Wheelchair 50 feet with 2 turns activity    Assist    Wheelchair 50 feet with 2  turns activity did not occur: Safety/medical concerns       Wheelchair 150 feet activity     Assist  Wheelchair 150 feet activity did not occur: Safety/medical concerns       Blood pressure (!) 113/96, pulse 83, temperature 98.3 F (36.8 C), resp. rate 19, height 5' 10.5" (1.791 m), weight 81.6 kg, SpO2 93 %.  Medical Problem List and Plan: 1.Functional and mobility deficitssecondary to debility after acute on chronic CHF with subsequent LVAD placement -patient maynotshower -ELOS/Goals: 5/2  -Continue CIR therapies including PT, OT    -pt with STM deficits  - scopolamine patch for ?vestibular symptoms   -team conf today 2. Chronic systolic CHF s/p LVAD 3:  -DVT/anticoagulation:Pharmaceutical:Continue Coumadin per pharmacy -antiplatelet therapy: ASA 3.Right arm neuropathic pain/brachial plexus injury/Pain Management:Tylenol or oxycodone prn.  -- gabapentin 200 mgat bedtime with some benefit -sling prn for RUE support and comfort  -  expect recovery of motor/sensory function in RUE  4/25 well controlled, continue current regimen.  4. Mood:LCSW to follow for evaluation and support. -antipsychotic agents: N/A 5. Neuropsych: This patientiscapable of making decisions on hisown behalf. 6. Skin/Wound Care:Optimize nutritional status--continue Nephro and prosource.  --Sacral decubitus: Air mattress.    -appears much improved-->continue WTD dressing   -pt is NO LONGER RECEIVING HYDRO --Right foot wound resolved.  7. Fluids/Electrolytes/Nutrition:Strict I/O. Continue to monitor labs with HD? 8. Chronic systolic CHF s/p LVAD 3:  --Cardiology to follow and manage cardiac issues.  -4/25 weight down, continue to monitor Filed Weights   11/27/20 0445 11/28/20 0420 11/29/20 0637  Weight: 81.6 kg 80.9 kg 81.6 kg    4/24 bp/volume mgt per nephro and  cards.    -off revatio   -TEDS, acclimation   -increased midodrine to 10mg  tid 4/22 9. Acute on chronic anemia: Continue to monitor H/H/ monitor for signs of bleeding.  --Was transfused with multiple units PRBC. --ON Nulecit Tu/Th/Sa  -transfuse as needed in HD hgb holding at 8.6 10. T2DM: Hgb A1c- monitor BS ac/hs.BS reasonable on current intake --continue Lantus in am with SSI for elevated BS.   4/23-25   improved after lantus decreased to 8u qam   -may need to readjust as he's eating more CBG (last 3)  Recent Labs    11/28/20 1647 11/28/20 2105 11/29/20 0625  GLUCAP 100* 133* 95    11. ESRD: Hyperkalemia being managed with lokelma/HD. -Scheduled HD at the end of the day to help with tolerance of activity during the day. --RD following 12. Mild leucocytosis: Resolved  13. A fib: NSR, HR remains controlled on Amiodarone, continue 14. Nausea: seems related to peristalsis  -- continue mucinex to reduce cough trigger  -last bm 4/22   -on miralax and senna-s bid  -increase reglan to 10mg  AC/HS    LOS: 15 days A FACE TO FACE EVALUATION WAS PERFORMED  Meredith Staggers 11/29/2020, 9:13 AM

## 2020-11-29 NOTE — Patient Care Conference (Signed)
Inpatient RehabilitationTeam Conference and Plan of Care Update Date: 11/29/2020   Time: 10:07 AM    Patient Name: Marcus Johnson.      Medical Record Number: [id number]  Date of Birth: February 12, 1961 Sex: Male         Room/Bed: 4M08C/4M08C-01 Payor Info: Payor: [employer] MEDICARE / Plan: [place] HMO / Product Type: *No Product type* /    Admit Date/Time:  11/14/2020  3:52 PM  Primary Diagnosis:  [facility] Problems: Principal Problem:   Debility    Expected Discharge Date: Expected Discharge Date: 12/05/20  Team Members Present: Physician leading conference: Dr. Alger Johnson Care Coodinator Present: Marcus Johnson, LCSWA;Marcus Basilio Creig Hines, RN, BSN, [place] Nurse Present: Marcus Nettles, RN PT Present: Marcus Johnson, PT OT Present: Marcus Johnson, OT PPS Coordinator present : Marcus Johnson, PT     Current Status/Progress Goal Weekly Team Focus  Bowel/Bladder   Oliguric, LBM 4/22- continent- on miralax and senna  remain continent,  provide complimetary intervention to assist with bowel movements   Swallow/Nutrition/ Hydration             ADL's   Still fluctuating greatly. Required max A over the weekend and vomited several times Sunday. Monday he was min A overall  supervision overall  d/c planning, ADLs, endurance, transfers   Mobility   CGA overall, ambulating >150 ft using RW, 4 steps B rails x3  Supervision-CGA overall  Activity tolerance, balance, functional mobility, gait and stair training, d/c planning, energy consevation techniques, LVAD education, patient/caregiver education   Communication             Safety/Cognition/ Behavioral Observations            Pain   pain to Rt elbow and buttocks about 6 out of 10- using scheduled muscle rub cream, gabapentin. Oxy PRN  pain <3/10- pain scale  Assess qshift and PRN-treat per order parameters   Skin   stage II to sacrum- wet to dry BID- healing per report  no new skin break down, no signs of  infection to existing wounds  assess skin q shift and PRN     Discharge Planning:  Pt to d/c to home with his son and brother. His brother Marcus Johnson will be his primary caregiver since his son works 3rd shift. Pt is new dialysis and LVAD.   Team Discussion: Titrating Reglan dose at night for nausea, GI tract does not seem to have a lot of peristalsis. Continent B/B, no reported pain or sleep issues. Stage 3 to the coccyx, dressing changes per order. Waiting on an assigned HD chair for outpatient dialysis. Brother and son will be available at discharge. Patient on target to meet rehab goals: yes, patient fluctuates in activity ability. Today his was min assist to contact guard. Son can provide most of the assistance that he needs, but he works during the day. Not sure if the brother will have the ability needed to assist during dialysis days. PT recommending having a chair at each landing to provide a rest break when going up and down the three flights of stairs at the apartment. PT also requesting to do dialysis this coming Thursday morning and have therapy in the afternoon to see how the patient can tolerate activity after a dialysis session.    *See Care Plan and progress notes for long and short-term goals.   Revisions to Treatment Plan:  Titrating Reglan dose.  Teaching Needs: Family education, medication management, pain management, skin/wound care, dressing changes,  LVAD education, transfer training, gait training, stair training, balance training, endurance training, safety awareness.  Current Barriers to Discharge: Decreased caregiver support, Medical stability, Home enviroment access/layout, Wound care, Lack of/limited family support, Weight, Hemodialysis, Medication compliance and Behavior  Possible Resolutions to Barriers: Continue current medications, LVAD education, provide emotional support.     Medical Summary Current Status: persistent nausea, better with reglan.  dizziness,light-headedness,  LVAD functioning, bp holding with midodrine  Barriers to Discharge: Medical stability   Possible Resolutions to [employer]: daily assessment of labs, data. titrating reglan for nausea   Continued Need for Acute Rehabilitation Level of Care: The patient requires daily medical management by a physician with specialized training in physical medicine and rehabilitation for the following reasons: Direction of a multidisciplinary physical rehabilitation program to maximize functional independence : Yes Medical management of patient stability for increased activity during participation in an intensive rehabilitation regime.: Yes Analysis of laboratory values and/or radiology reports with any subsequent need for medication adjustment and/or medical intervention. : Yes   I attest that I was present, lead the team conference, and concur with the assessment and plan of the team.   Marcus Johnson 11/29/2020, 1:05 PM

## 2020-11-29 NOTE — Progress Notes (Signed)
Physical Therapy Weekly Progress Note  Patient Details  Name: Marcus Johnson. MRN: [id number] Date of Birth: [birth year]  Beginning of progress report period: November 22, 2020 End of progress report period: November 29, 2020  Today's Date: 11/29/2020 PT Individual Time: 0902-1000 PT Individual Time Calculation (min): 58 min   Patient has met 3 of 3 short term goals.  Patient with steady progress this week with only intermittent symptoms of "lightheadedness" or "dizziness." Does have intermittent bouts of nausea/emesis between therapies, but not limiting participation. He did have 2 days of decline in functional mobility, requiring mod/max A and unable to progress to ambulation, unsure of cause.   He is currently performing bed mobility with supervision, transfers with CGA, min A for lower surfaces, ambulating >150 feet with CGA using RW, and able to perform 4-6" steps x3 with B rails and rest breaks in between.   Patient continues to demonstrate the following deficits muscle weakness, decreased cardiorespiratoy endurance, decreased coordination, decreased visual acuity, decreased problem solving and decreased memory, central vs peripheral vestibular disfunction and decreased standing balance, decreased postural control and decreased balance strategies and therefore will continue to benefit from skilled PT intervention to increase functional independence with mobility.  Patient progressing toward long term goals..  Continue plan of care.  PT Short Term Goals Week 2:  PT Short Term Goal 1 (Week 2): Patient will perform basic transfers with CGA using LRAD consistently. PT Short Term Goal 1 - Progress (Week 2): Met PT Short Term Goal 2 (Week 2): Patient will consistently ambulate >50 ft using LRAD with CGA. PT Short Term Goal 2 - Progress (Week 2): Met PT Short Term Goal 3 (Week 2): Patient will initiate stair training. PT Short Term Goal 3 - Progress (Week 2): Met Week 3:  PT Short Term  Goal 1 (Week 3): STG=LTG due to ELOS.  Skilled Therapeutic Interventions/Progress Updates:     Patient in bed upon PT arrival. Patient alert and agreeable to PT session. Patient denied pain during session, reported multiple bouts of emesis during OT session this morning. Reported minimal levels of nausea at this time and throughout session without emesis.   Switched patient to battery packs at beginning of session with total A for time/energy management due to upper extremity coordination deficits.   Therapeutic Activity: Bed Mobility: Patient performed supine to/from sit with supervision with HOB elevated due to nausea.  Transfers: Patient performed stand pivot bed<>w/c with CGA and sit to/from stand x3 with CGA-min A with fatigue without AD. Provided verbal cues for forward trunk lean and forward weight shift to bring COM over COG for improved balance during transfers.  Gait Training:  Patient ambulated 96 feet using RW with CGA. Ambulated withdecreased gait speed, decreased step length and heightwith minimal trunk rotation or visual scanning and slight veer to the R. Provided verbal cues forpacing, paced breathing, and improved visual scanning for visual and spatial awareness. Patient ascended/descended 4-6" steps and 5-6" steps using L rail with B upper extremity support with CGA-min A. Performed step-to gait pattern leading with R while ascending and L while descending. Provided cues for technique and sequencing.   Wheelchair Mobility:  Patient was transported in the w/c with total A throughout session for energy conservation and time management.  Patient required increased time and rest breaks with mobility due to decreased activity tolerance and attempts to minimize setting of nausea. Educated on energy conservation management with use of modified Borg RPE, maintaining levels below 7/10 with activity  to reduce over exertion. Also, discussed planning out activities and selecting whether  or not to perform more mobility prior to another therapy session or daily activity at home based on energy levels. Patient self selected to by transported by wheelchair back to the room to conserve energy for another upcoming PT session in 1 hour. Discussed concerns about patient having dialysis prior to having to go up 26 steps to his apartment 3x/week. Plan to discuss scheduling dialysis in the morning on Thursday to trial mobility following dialysis in PT session in the afternoon, patient in agreement. Patient reports his family is diligently looking for an alternative living space and even found a 1 level home to buy, but were unable to reach a decision in time before someone else bought it.    Patient in bed at end of session with breaks locked, 4 rails up per patient's request, returned to LVAD monitor with 4 batteries charging, and all needs within reach.   Therapy Documentation Precautions:  Precautions Precautions: Fall,Sternal,Other (comment) Precaution Comments: LVAD, sacral wound, Left BPPV Required Braces or Orthoses: Other Brace Other Brace: thumb loop and buddy strap R hand Restrictions Weight Bearing Restrictions: No RUE Weight Bearing:  (sternal) Other Position/Activity Restrictions: sternal precautions  Therapy/Group: Individual Therapy  Montee Tallman L Aneudy Champlain PT, DPT  11/29/2020, 4:53 PM

## 2020-11-29 NOTE — Progress Notes (Signed)
ANTICOAGULATION CONSULT NOTE - Follow Up Consult  Pharmacy Consult for Warfarin Indication: LVAD  Allergies  Allergen Reactions  . Meclizine Hcl Anaphylaxis and Swelling  . Ivabradine Nausea Only    Patient Measurements: Height: 5' 10.5" (179.1 cm) Weight: 81.6 kg (180 lb) IBW/kg (Calculated) : 74.15 kg  Vital Signs: Temp: 98.3 F (36.8 C) (04/26 0432) BP: 113/96 (04/26 0510) Pulse Rate: 83 (04/26 0432)  Labs: Recent Labs    11/27/20 0410 11/28/20 0436 11/29/20 0413  HGB 9.1*  --  9.1*  HCT 31.0*  --  30.3*  PLT 171  --  198  LABPROT 24.6* 24.1* 24.0*  INR 2.2* 2.2* 2.1*  CREATININE 4.40* 5.67* 6.72*    Estimated Creatinine Clearance: 12.3 mL/min (A) (by C-G formula based on SCr of 6.72 mg/dL (H)).   Assessment: 60 yo male s/p new LVAD implant with HeartMate III on 3/10. Warfarin started per MD on 3/12, and pharmacy has now been consulted to take over warfarin dosing.   INR stable and therapeutic atf 2.1 today. The patient has been very sensitive to small warfarin dose changes. Continues to struggle with daily nausea/vomiting/constipation. No bleeding issues noted. CBC and LDH stable.  Tremors also noted by patient. We discussed possible medication causes and working with rehab and cards team to address. We will hold amio for starters and follow closely. May need higher doses of warfarin going forward.   Goal of Therapy: INR 2-2.5 Monitor platelets by anticoagulation protocol: Yes   Plan:  Warfarin 3mg  tonight Daily PT/INR  Monitor for signs and symptoms of bleeding  Erin Hearing PharmD., BCPS Clinical Pharmacist 11/29/2020 9:03 AM  Please check [email or web address] for unit-specific pharmacy phone numbers.

## 2020-11-29 NOTE — Progress Notes (Signed)
LVAD Coordinator Rounding Note:  HM III LVAD implanted on 10/13/20 by Dr Cyndia Bent under Destination Therapy criteria.  Discharged from [facility] and admitted to [facility] 11/14/20.  Pt lying in bed this morning, just finished therapy. Rehab doc in the room. Pt is frustrated this morning with the ongoing nausea/vomiting. Pt states "no one listens to me, the meds aren't helping my nausea." supported pt this morning emotionally and let him vent about current situation. Relied pts feelings to heart failure team providers.  Per OT memory assessment pt found to have mild short term memory deficits.   No family at bedside for dressing change education.   Pt has HD scheduled on Tues/Thurs/Sat. Tolerating well. Tunneled catheter in place.   Vital signs: Temp: 98.5 HR: 81 Doppler: 100 Auto cuff: 113/96 (104) O2 Sat: 93% on RA Wt: 183.6>190.9>194>185.4>188.6>184.2>181.6>176.3>178.4>180 lbs    LVAD interrogation reveals:  Speed: 5200 Flow: 4.1  Power: 3.9w PI: 5.6 Hematocrit: 30  Alarms: none Events: none  Fixed speed: 5200 Low speed limit: 4900  Drive Line: CDI. Anchor secure. Next dressing change will be due 12/01/20 using acell. No family available for dressing change.   Labs:  LDH trend: 199>191>196>180>174>168>182>177>287>168>163  INR trend: 1.5>1.7>1.9>2.4>3.2>2.9>3.2>2.9>2.2>2.1  Anticoagulation Plan: -INR Goal: 2.0-2.5 -ASA Dose: stopped 30 days post op per protocol  Device:  -  Medtronic single -  Pacing: VVI 40 - Therapies: on 200 bpm  Plan/Recommendations:  1. Call VAD coordinator for equipment or drive line issues 2. Twice weekly dressing changes while using acell. Next dressing due 12/01/20.  Tanda Rockers RN [place] Coordinator  Office: [phone]  24/7 Pager: [phone]

## 2020-11-29 NOTE — Progress Notes (Addendum)
Patient ID: Marcus Sellin., male   DOB: [birth year], 60 y.o.   MRN: [id number]     Advanced Heart Failure Rounding Note  PCP-Cardiologist: No primary care provider on file.   Subjective:    - 2/27 Milrinone switched to dobutamine 5 mcg.  - 2/28 Moved to ICU. Dobutamine increased to 7.5 mcg and Norepi 3 mcg added. Diuretics held.  - 3/1 Impella 5.5 placed.  - 3/4 Teeth removed - 3/10 HM3 LVAD placed - 3/13 VAD speed increased to 5500. [name] out  - 3/14 Nephrology consulted. Given 1UPRBCs  - 3/15 Started CRRT. Given 1UPRBCs.  - 3/23 VAD Speed turned down to 5300  - 3/23 CRRT and Milrinone Stopped. Back in Afib - 3/24 CRRT restarted.  - 3/27 CVVH off.  - 3/28 iHD - 3/29 iHD. Walked 75 feet!! - 3/20 Ramp Echo: Fixed speed: 5200 Low speed limit: 4900 - 4/3 S/P RIJ HD catheter.  - 4/10 60th B-day!  - 4/11 Transferred to CIR. Back on heparin drip.   INR 2.1, LDH 163   Complains of tremors. Ongoing nausea.    LVAD Interrogation HM 3: Speed: 5200 Flow: 3.7 PI: 6.6 Power: 4  Rare PI events. Marland Kitchen VAD interrogated personally. Parameters stable.   Objective:   Weight Range: 81.6 kg Body mass index is 25.46 kg/m.   Vital Signs:   Temp:  [98 F (36.7 C)-98.7 F (37.1 C)] 98.3 F (36.8 C) (04/26 0432) Pulse Rate:  [73-86] 83 (04/26 0432) Resp:  [16-19] 19 (04/26 0432) BP: (100-119)/(81-102) 113/96 (04/26 0510) SpO2:  [93 %-97 %] 93 % (04/26 0432) Weight:  [81.6 kg] 81.6 kg (04/26 0637) Last BM Date: 11/25/20  Weight change: Filed Weights   11/27/20 0445 11/28/20 0420 11/29/20 0637  Weight: 81.6 kg 80.9 kg 81.6 kg    Intake/Output:   Intake/Output Summary (Last 24 hours) at 11/29/2020 0900 Last data filed at 11/29/2020 0840 Gross per 24 hour  Intake 300 ml  Output 50 ml  Net 250 ml      Physical Exam  Physical Exam: GENERAL: No acute distress. HEENT: normal  NECK: Supple, JVP flat   .  2+ bilaterally, no bruits.  No lymphadenopathy or thyromegaly  appreciated.   CARDIAC:  Mechanical heart sounds with LVAD hum present.  LUNGS:  Clear to auscultation bilaterally.  ABDOMEN:  Soft, round, nontender, positive bowel sounds x4.     LVAD exit site: well-healed and incorporated.  Dressing dry and intact.  No erythema or drainage.  Stabilization device present and accurately applied.  Driveline dressing is being changed daily per sterile technique. EXTREMITIES:  Warm and dry, no cyanosis, clubbing, rash or edema . Tremors noted in hands.  NEUROLOGIC:  Alert and oriented x 3.    No aphasia.  No dysarthria.  Affect pleasant.      Labs    CBC Recent Labs    11/27/20 0410 11/29/20 0413  WBC 9.3 9.7  HGB 9.1* 9.1*  HCT 31.0* 30.3*  MCV 89.6 88.3  PLT 171 672   Basic Metabolic Panel Recent Labs    11/28/20 0436 11/29/20 0413  NA 135 136  K 4.2 4.7  CL 98 96*  CO2 27 26  GLUCOSE 100* 117*  BUN 43* 55*  CREATININE 5.67* 6.72*  CALCIUM 9.3 9.5  MG 2.3 2.4  PHOS 5.0* 5.3*   Liver Function Tests Recent Labs    11/28/20 0436 11/29/20 0413  ALBUMIN 2.6* 2.7*   No results for input(s): LIPASE, AMYLASE  in the last 72 hours. Cardiac Enzymes No results for input(s): CKTOTAL, CKMB, CKMBINDEX, TROPONINI in the last 72 hours.  BNP: BNP (last 3 results) Recent Labs    11/10/20 0150 11/17/20 0411 11/24/20 0454  BNP 905.1* 630.2* 723.8*    ProBNP (last 3 results) No results for input(s): PROBNP in the last 8760 hours.   D-Dimer No results for input(s): DDIMER in the last 72 hours. Hemoglobin A1C No results for input(s): HGBA1C in the last 72 hours. Fasting Lipid Panel No results for input(s): CHOL, HDL, LDLCALC, TRIG, CHOLHDL, LDLDIRECT in the last 72 hours. Thyroid Function Tests No results for input(s): TSH, T4TOTAL, T3FREE, THYROIDAB in the last 72 hours.  Invalid input(s): FREET3  Other results:   Imaging    No results found.   Medications:     Scheduled Medications: . (feeding supplement) PROSource  Plus  30 mL Oral BID BM  . amiodarone  200 mg Oral Daily  . vitamin C  1,000 mg Oral Daily  . Chlorhexidine Gluconate Cloth  6 each Topical Q0600  . Chlorhexidine Gluconate Cloth  6 each Topical BID  . Chlorhexidine Gluconate Cloth  6 each Topical Q0600  . darbepoetin (ARANESP) injection - DIALYSIS  100 mcg Intravenous Q Tue-HD  . feeding supplement (NEPRO CARB STEADY)  237 mL Oral BID BM  . gabapentin  100 mg Oral QHS  . guaiFENesin  600 mg Oral BID  . insulin aspart  0-15 Units Subcutaneous TID WC  . insulin aspart  0-5 Units Subcutaneous QHS  . insulin glargine  8 Units Subcutaneous Daily  . metoCLOPramide  5 mg Oral TID AC & HS  . midodrine  10 mg Oral TID WC  . multivitamin  1 tablet Oral QHS  . Muscle Rub   Topical BID  . pantoprazole  40 mg Oral Daily  . polyethylene glycol  17 g Oral BID  . rosuvastatin  10 mg Oral Daily  . scopolamine  1 patch Transdermal Q72H  . senna-docusate  2 tablet Oral BID  . sorbitol  30 mL Oral Daily  . Warfarin - Pharmacist Dosing Inpatient   Does not apply q1600    Infusions: . sodium chloride 10 mL (11/17/20 1430)  . sodium chloride    . sodium chloride      PRN Medications: sodium chloride, sodium chloride, sodium chloride, acetaminophen, alteplase, alteplase, alum & mag hydroxide-simeth, bisacodyl, dextrose, diphenhydrAMINE, guaiFENesin-dextromethorphan, heparin, heparin, lidocaine (PF), lidocaine-prilocaine, ondansetron (ZOFRAN) IV, oxyCODONE, pentafluoroprop-tetrafluoroeth, prochlorperazine **OR** prochlorperazine **OR** prochlorperazine, sodium chloride flush, sodium phosphate, traZODone, white petrolatum    Assessment/Plan   1. Acute on chronic systolic HF -> cardiogenic shock:  ICM. Has Medtronic ICD. EF 15%. Low output persisted despite milrinone and then dobutamine with rise in creatinine to 2.5. Impella 5.5 placed 3/1.  Creatinine improved to 1.7 and HM3 LVAD was placed on 3/10. Developed post-op renal failure requiring CVVH. Now  off milrinone and epinephrine. Last echo showed moderate RV enlargement with mildly decreased RV function and VAD speed decreased to 5300 due to left-sided septum. CRRT stopped 3/23 but restarted 3/24. CVVH stopped again on 3/27. Ramp echo 3/30 with speed decreased to 5200. Tolerating iHD for volume management.   MAP 90-100s. Continue Midodrine  10 mg tid due to dizzinness/orthostatic.  - Continue compression stockings.  - HD for volume management - Continue midodrine 10 tid.  2. LVAD: 3/13 VAD speed increased to 5500. 3/23 speed decreased to 5300 and decreased to 5200 on 3/30. VAD interrogated personally. LDH  163  INR 2.1 today  - On warfarin, on ASA 81 daily.  Discussed warfarin dosing with PharmD personally. - DL site ok 3. CAD s/p CABG 2002:  Last cath in 6/19 with patent grafts. - Continue Crestor 40 mg daily.  4. AKI on CKD Stage 3b: Suspect that this is a combination of cardiorenal syndrome and diabetic nephropathy and possibly ATN. Developed post-op AKI. Started on CRRT 3/15. CVVH stopped 3/23 but restarted 3/24. Now getting iHD, looks like he will be HD-dependent.  Has tunneled catheter.  - HD T/Th/S - Dr Augustin Coupe to talk with VVS, but with continuous flow LVAD, think AV fistula would be less likely to mature.  Will await decision by VVS and nephrology in terms of access.  5. Atrial flutter/fibrillation: s/p DC-CV 08/21/18.  - Stop amio with tremors.  6. Wound R Foot:  Resolved.  7. Right arm pain: Likely neuropathic from stretch of brachial plexus with Impella placement.  - Continue gabapentin. Able to move right hand a little better.  8. ID:  WBCs normal.  9. DM: History of very poor control but hgbA1c trending down. Most recent 7.7%.  Insulin drip stopped 3/24. Blood glucose controlled on reduced Lantus and  sliding scale insulin to moderate.   - Diabetes coordinators have seen 10. Anemia: Hgb 9.1   11. Deconditioning - Continue to work with CIR 12. Unstageable Pressure Ulcer on  buttocks: Much improved 100% granulation.  - Hydrotherapy completed - Continue repositioning 13. NSVT: Stop amio with tremors.  14. Dizziness: Still with symptoms that seem like BPPV at times. Has had Epley maneuvers.  15. Suspected Gastroparesis. Ongoing nausea. On reglan. ? Adding erythromycin.   Tremors- I am going to stop amio and see if this helps with tremors.   Amy Clegg NP-C  11/29/2020  Patient seen with NP, agree with the above note.   Main problem continues to be nausea.  Also with tremors.    Suspect gastroparesis associated with diabetes.  Will see if amiodarone is making GI symptoms worse, stop today.  Increase Reglan.  Consider addition of erythromycin for gastroparesis.   Hopefully tremors will improve also off amiodarone.   Loralie Champagne 11/29/2020 10:07 AM

## 2020-11-29 NOTE — Progress Notes (Signed)
Patient ID: Marcus Borawski., male   DOB: 1960/12/17, 60 y.o.   MRN: [id number]  Medical team requested first shift dialysis on Thursday for mobility observation with PT after session.   SW spoke with [employer].to discuss. Will put pt as first shift. Still waiting on updates on dialysis center.   SW unable to meet with pt to give updates from team confernece as pt was in dialysis.   SW spoke with [name] [phone]) to provide updates from team conference, d/c date remains 5/2, selecting [facility], and family education. SW informed still waiting on updates on a dialysis location and pt would not discharge until he has an assigned location. Family edu scheduled for Friday (4/29)  9am-12pm with son and brother Marcus Johnson. Plans to review [facility]HA list with father and will inform SW on agency selected. SW to follow-up.   Loralee Pacas, MSW, [place] Office: [phone] Cell: [phone] Fax: [phone]

## 2020-11-30 LAB — RENAL FUNCTION PANEL
Albumin: 2.7 g/dL — ABNORMAL LOW (ref 3.5–5.0)
Anion gap: 11 (ref 5–15)
BUN: 27 mg/dL — ABNORMAL HIGH (ref 6–20)
CO2: 28 mmol/L (ref 22–32)
Calcium: 8.5 mg/dL — ABNORMAL LOW (ref 8.9–10.3)
Chloride: 97 mmol/L — ABNORMAL LOW (ref 98–111)
Creatinine, Ser: 4.09 mg/dL — ABNORMAL HIGH (ref 0.61–1.24)
GFR, Estimated: 16 mL/min — ABNORMAL LOW (ref >60)
Glucose, Bld: 80 mg/dL (ref 70–99)
Phosphorus: 3.8 mg/dL (ref 2.5–4.6)
Potassium: 3.5 mmol/L (ref 3.5–5.1)
Sodium: 136 mmol/L (ref 135–145)

## 2020-11-30 LAB — MAGNESIUM: Magnesium: 2 mg/dL (ref 1.7–2.4)

## 2020-11-30 LAB — GLUCOSE, CAPILLARY
Glucose-Capillary: 91 mg/dL (ref 70–99)
Glucose-Capillary: 175 mg/dL — ABNORMAL HIGH (ref 70–99)
Glucose-Capillary: 164 mg/dL — ABNORMAL HIGH (ref 70–99)
Glucose-Capillary: 149 mg/dL — ABNORMAL HIGH (ref 70–99)

## 2020-11-30 LAB — PROTIME-INR
INR: 2.1 — ABNORMAL HIGH (ref 0.8–1.2)
Prothrombin Time: 23.8 seconds — ABNORMAL HIGH (ref 11.4–15.2)

## 2020-11-30 LAB — LACTATE DEHYDROGENASE: LDH: 175 U/L (ref 98–192)

## 2020-11-30 LAB — AMMONIA: Ammonia: 20 umol/L (ref 9–35)

## 2020-11-30 MED ORDER — WARFARIN SODIUM 2 MG PO TABS
2.0000 mg | ORAL_TABLET | Freq: Once | ORAL | Status: AC
  Administered 2020-11-30: 2 mg via ORAL
  Filled 2020-11-30: qty 1

## 2020-11-30 MED ORDER — ERYTHROMYCIN BASE 250 MG PO TABS
250.0000 mg | ORAL_TABLET | Freq: Three times a day (TID) | ORAL | Status: DC
  Administered 2020-11-30 – 2020-12-09 (×28): 250 mg via ORAL
  Filled 2020-11-30 (×30): qty 1

## 2020-11-30 MED ORDER — CHLORHEXIDINE GLUCONATE CLOTH 2 % EX PADS
6.0000 | MEDICATED_PAD | Freq: Every day | CUTANEOUS | Status: DC
  Administered 2020-11-30 – 2020-12-01 (×2): 6 via TOPICAL

## 2020-11-30 NOTE — Progress Notes (Signed)
PROGRESS NOTE   Subjective/Complaints: Didn't get much rest last night. No nausea yet this morning. Took reglan by itself 15 minutes prior to eating to see how he does.   ROS: Patient denies fever, rash, sore throat, blurred vision, nausea, vomiting, diarrhea, cough, shortness of breath or chest pain, joint or back pain, headache, or mood change.     Objective:   No results found. Recent Labs    11/29/20 0413  WBC 9.7  HGB 9.1*  HCT 30.3*  PLT 198   Recent Labs    11/28/20 0436 11/29/20 0413  NA 135 136  K 4.2 4.7  CL 98 96*  CO2 27 26  GLUCOSE 100* 117*  BUN 43* 55*  CREATININE 5.67* 6.72*  CALCIUM 9.3 9.5    Intake/Output Summary (Last 24 hours) at 11/30/2020 0758 Last data filed at 11/30/2020 0730 Gross per 24 hour  Intake 240 ml  Output 900 ml  Net -660 ml     Pressure Injury 10/16/20 Coccyx Mid Stage 3 -  Full thickness tissue loss. Subcutaneous fat may be visible but bone, tendon or muscle are NOT exposed. Blister that has drained, 2 cm X 4.5 cm (Active)  10/16/20 0800  Location: Coccyx  Location Orientation: Mid  Staging: Stage 3 -  Full thickness tissue loss. Subcutaneous fat may be visible but bone, tendon or muscle are NOT exposed. (as per WOC note on 11/14/20)  Wound Description (Comments): Blister that has drained, 2 cm X 4.5 cm  Present on Admission: No     Pressure Injury 10/16/20 Coccyx Right Stage 2 -  Partial thickness loss of dermis presenting as a shallow open injury with a red, pink wound bed without slough. blister that has drained, 0.5 cm X 0.5 cm (Active)  10/16/20 0800  Location: Coccyx  Location Orientation: Right  Staging: Stage 2 -  Partial thickness loss of dermis presenting as a shallow open injury with a red, pink wound bed without slough.  Wound Description (Comments): blister that has drained, 0.5 cm X 0.5 cm  Present on Admission: No    Physical Exam: Vital Signs Blood  pressure 105/79, pulse 91, temperature 98.3 F (36.8 C), temperature source Oral, resp. rate 18, height 5' 10.5" (1.791 m), weight 78.4 kg, SpO2 100 %. Constitutional: No distress . Vital signs reviewed. HEENT: EOMI, oral membranes moist Neck: supple Cardiovascular: RRR without murmur. No JVD    Respiratory/Chest: CTA Bilaterally without wheezes or rales. Normal effort    GI/Abdomen: BS +, non-tender, non-distended Ext: no clubbing, cyanosis, or edema Psych: pleasant and cooperative Skin: tunneled cath right shoulder in place.LVAD site dry, sacral wound no visualized Neuro: Pt is cognitively appropriate with normal insight and awareness.  STM deficits. Cranial nerves 2-12 are intact. Sensory exam is normal. Reflexes are 2+ in all 4's. Fine motor coordination is intact. R>L UE tremors mild today. Motor function is grossly 4/5 with tremor. RUE  Remains 3-4/5 but still limited d/t pain in shoulder. has scattered sensory loss throughout right arm. LE motor 3+ to 4/5 Musculoskeletal:  RUE moving more freely   Assessment/Plan: 1. Functional deficits which require 3+ hours per day of interdisciplinary therapy in a  comprehensive inpatient rehab setting.  Physiatrist is providing close team supervision and 24 hour management of active medical problems listed below.  Physiatrist and rehab team continue to assess barriers to discharge/monitor patient progress toward functional and medical goals  Care Tool:  Bathing  Bathing activity did not occur:  (time constraints) Body parts bathed by patient: Left arm,Abdomen,Chest,Front perineal area,Right upper leg,Left upper leg,Face,Right arm,Buttocks   Body parts bathed by helper: Right lower leg,Left lower leg     Bathing assist Assist Level: Moderate Assistance - Patient 50 - 74%     Upper Body Dressing/Undressing Upper body dressing Upper body dressing/undressing activity did not occur (including orthotics): Environmental limitations (PICC  running) What is the patient wearing?: Pull over shirt    Upper body assist Assist Level: Minimal Assistance - Patient > 75%    Lower Body Dressing/Undressing Lower body dressing      What is the patient wearing?: Pants     Lower body assist Assist for lower body dressing: Moderate Assistance - Patient 50 - 74%     Toileting Toileting Toileting Activity did not occur Landscape architect and hygiene only): N/A (no void or bm)  Toileting assist Assist for toileting: Dependent - Patient 0%     Transfers Chair/bed transfer  Transfers assist  Chair/bed transfer activity did not occur: Safety/medical concerns  Chair/bed transfer assist level: Contact Guard/Touching assist Chair/bed transfer assistive device: Armrests   Locomotion Ambulation   Ambulation assist   Ambulation activity did not occur: Safety/medical concerns  Assist level: Contact Guard/Touching assist Assistive device: Walker-rolling Max distance: 96 ft   Walk 10 feet activity   Assist  Walk 10 feet activity did not occur: Safety/medical concerns  Assist level: Contact Guard/Touching assist Assistive device: Walker-rolling   Walk 50 feet activity   Assist Walk 50 feet with 2 turns activity did not occur: Safety/medical concerns  Assist level: Contact Guard/Touching assist Assistive device: Walker-rolling    Walk 150 feet activity   Assist Walk 150 feet activity did not occur: Safety/medical concerns  Assist level: Contact Guard/Touching assist Assistive device: Walker-rolling    Walk 10 feet on uneven surface  activity   Assist Walk 10 feet on uneven surfaces activity did not occur: Safety/medical concerns         Wheelchair     Assist Will patient use wheelchair at discharge?: No (Per PT long term goals)   Wheelchair activity did not occur: Safety/medical concerns         Wheelchair 50 feet with 2 turns activity    Assist    Wheelchair 50 feet with 2 turns  activity did not occur: Safety/medical concerns       Wheelchair 150 feet activity     Assist  Wheelchair 150 feet activity did not occur: Safety/medical concerns       Blood pressure 105/79, pulse 91, temperature 98.3 F (36.8 C), temperature source Oral, resp. rate 18, height 5' 10.5" (1.791 m), weight 78.4 kg, SpO2 100 %.  Medical Problem List and Plan: 1.Functional and mobility deficitssecondary to debility after acute on chronic CHF with subsequent LVAD placement -patient maynotshower -ELOS/Goals: 5/2  -Continue CIR therapies including PT, OT    -pt with STM deficits  - scopolamine patch for ?vestibular symptoms.  2. Chronic systolic CHF s/p LVAD 3:  -DVT/anticoagulation:Pharmaceutical:Continue Coumadin per pharmacy -antiplatelet therapy: ASA 3.Right arm neuropathic pain/brachial plexus injury/Pain Management:Tylenol or oxycodone prn.  -- gabapentin 200 mgat bedtime with some benefit -sling prn for RUE support and comfort  -  expect recovery of motor/sensory function in RUE  4/25 well controlled, continue current regimen.  4. Mood:LCSW to follow for evaluation and support. -antipsychotic agents: N/A 5. Neuropsych: This patientiscapable of making decisions on hisown behalf. 6. Skin/Wound Care:Optimize nutritional status--continue Nephro and prosource.  --Sacral decubitus: Air mattress.    -appears much improved-->continue WTD dressing   -pt is NO LONGER RECEIVING HYDRO --Right foot wound resolved.  7. Fluids/Electrolytes/Nutrition:Strict I/O. Continue to monitor labs with HD? 8. Chronic systolic CHF s/p LVAD 3:  --Cardiology to follow and manage cardiac issues.  -4/26 weight down, continue to monitor Filed Weights   11/29/20 1405 11/29/20 1750 11/30/20 0334  Weight: 81.8 kg 81 kg 78.4 kg    4/24 bp/volume mgt per nephro and  cards.    -off revatio   -TEDS, acclimation   -increased midodrine to 10mg  tid 4/22 9. Acute on chronic anemia: Continue to monitor H/H/ monitor for signs of bleeding.  --Was transfused with multiple units PRBC. --ON Nulecit Tu/Th/Sa  -transfuse as needed in HD hgb holding at 8.6 10. T2DM: Hgb A1c- monitor BS ac/hs.BS reasonable on current intake --continue Lantus in am with SSI for elevated BS.   4/23-25   improved after lantus decreased to 8u qam   -may need to readjust as he's eating more CBG (last 3)  Recent Labs    11/29/20 1920 11/29/20 2107 11/30/20 0610  GLUCAP 70 127* 91    11. ESRD: Hyperkalemia being managed with lokelma/HD. -Scheduled HD at the end of the day to help with tolerance of activity during the day. --RD following 12. Mild leucocytosis: Resolved  13. A fib: NSR, HR remains controlled on Amiodarone, continue 14. Nausea: likely d/t to peristalsis/gastroparesis  -continue mucinex to reduce cough trigger  -had large bm early this morning. Needs to be moving bowels regularly  -on miralax and senna-s bid  -increased reglan to 10mg  AC/HS--discu  -amiodarone reduced per cards  -discussed with the patient that the nausea is likely  Mult-faceted related to LVAD, diabetic gastroparesis, ?meds.     LOS: 16 days A FACE TO FACE EVALUATION WAS PERFORMED  Meredith Staggers 11/30/2020, 7:58 AM

## 2020-11-30 NOTE — Progress Notes (Addendum)
Patient ID: Marcus Klingbeil., male   DOB: 1960/09/25, 60 y.o.   MRN: [id number]     Advanced Heart Failure Rounding Note  PCP-Cardiologist: No primary care provider on file.   Subjective:    - 2/27 Milrinone switched to dobutamine 5 mcg.  - 2/28 Moved to ICU. Dobutamine increased to 7.5 mcg and Norepi 3 mcg added. Diuretics held.  - 3/1 Impella 5.5 placed.  - 3/4 Teeth removed - 3/10 HM3 LVAD placed - 3/13 VAD speed increased to 5500. [name] out  - 3/14 Nephrology consulted. Given 1UPRBCs  - 3/15 Started CRRT. Given 1UPRBCs.  - 3/23 VAD Speed turned down to 5300  - 3/23 CRRT and Milrinone Stopped. Back in Afib - 3/24 CRRT restarted.  - 3/27 CVVH off.  - 3/28 iHD - 3/29 iHD. Walked 75 feet!! - 3/20 Ramp Echo: Fixed speed: 5200 Low speed limit: 4900 - 4/3 S/P RIJ HD catheter.  - 4/10 60th B-day!  - 4/11 Transferred to CIR. Back on heparin drip.  -4/26 amio stopped due to tremors.   INR 2.1, LDH 163  Nausea a little better. Walked 200 feet and up 7 steps with PT.   LVAD Interrogation HM 3: Speed: 5200 Flow:4.5  PI: 4 Power: 4  Rare PI events. Marland Kitchen VAD interrogated personally. Parameters stable.   Objective:   Weight Range: 78.4 kg Body mass index is 24.44 kg/m.   Vital Signs:   Temp:  [97.6 F (36.4 C)-98.4 F (36.9 C)] 98.3 F (36.8 C) (04/27 0334) Pulse Rate:  [52-104] 91 (04/27 0334) Resp:  [14-18] 18 (04/27 0334) BP: (84-132)/(27-105) 112/84 (04/27 1100) SpO2:  [93 %-100 %] 100 % (04/27 0334) Weight:  [78.4 kg-81.8 kg] 78.4 kg (04/27 0334) Last BM Date:  (pt reports did not have bowel movement today??)  Weight change: Filed Weights   11/29/20 1405 11/29/20 1750 11/30/20 0334  Weight: 81.8 kg 81 kg 78.4 kg    Intake/Output:   Intake/Output Summary (Last 24 hours) at 11/30/2020 1358 Last data filed at 11/30/2020 0730 Gross per 24 hour  Intake --  Output 900 ml  Net -900 ml      Physical Exam   Physical Exam: GENERAL: No acute distress. Standing  in the room HEENT: normal  NECK: Supple, JVP flat .  2+ bilaterally, no bruits.  No lymphadenopathy or thyromegaly appreciated.   CARDIAC:  Mechanical heart sounds with LVAD hum present.  LUNGS:  Clear to auscultation bilaterally.  ABDOMEN:  Soft, round, nontender, positive bowel sounds x4.     LVAD exit site:  Dressing dry and intact.  No erythema or drainage.  Stabilization device present and accurately applied.  Driveline dressing is being changed daily per sterile technique. EXTREMITIES:  Warm and dry, no cyanosis, clubbing, rash or edema . R and LLE ted hose.  NEUROLOGIC:  Alert and oriented x 3.    No aphasia.  No dysarthria.  Affect pleasant.     Labs    CBC Recent Labs    11/29/20 0413  WBC 9.7  HGB 9.1*  HCT 30.3*  MCV 88.3  PLT 539   Basic Metabolic Panel Recent Labs    11/29/20 0413 11/30/20 0400  NA 136 136  K 4.7 3.5  CL 96* 97*  CO2 26 28  GLUCOSE 117* 80  BUN 55* 27*  CREATININE 6.72* 4.09*  CALCIUM 9.5 8.5*  MG 2.4 2.0  PHOS 5.3* 3.8   Liver Function Tests Recent Labs    11/29/20 0413  11/30/20 0400  ALBUMIN 2.7* 2.7*   No results for input(s): LIPASE, AMYLASE in the last 72 hours. Cardiac Enzymes No results for input(s): CKTOTAL, CKMB, CKMBINDEX, TROPONINI in the last 72 hours.  BNP: BNP (last 3 results) Recent Labs    11/10/20 0150 11/17/20 0411 11/24/20 0454  BNP 905.1* 630.2* 723.8*    ProBNP (last 3 results) No results for input(s): PROBNP in the last 8760 hours.   D-Dimer No results for input(s): DDIMER in the last 72 hours. Hemoglobin A1C No results for input(s): HGBA1C in the last 72 hours. Fasting Lipid Panel No results for input(s): CHOL, HDL, LDLCALC, TRIG, CHOLHDL, LDLDIRECT in the last 72 hours. Thyroid Function Tests No results for input(s): TSH, T4TOTAL, T3FREE, THYROIDAB in the last 72 hours.  Invalid input(s): FREET3  Other results:   Imaging    No results found.   Medications:     Scheduled  Medications:  (feeding supplement) PROSource Plus  30 mL Oral BID BM   vitamin C  1,000 mg Oral Daily   Chlorhexidine Gluconate Cloth  6 each Topical Q0600   Chlorhexidine Gluconate Cloth  6 each Topical BID   Chlorhexidine Gluconate Cloth  6 each Topical Q0600   Chlorhexidine Gluconate Cloth  6 each Topical Q0600   darbepoetin (ARANESP) injection - DIALYSIS  100 mcg Intravenous Q Tue-HD   erythromycin  250 mg Oral TID AC   feeding supplement (NEPRO CARB STEADY)  237 mL Oral BID BM   gabapentin  100 mg Oral QHS   guaiFENesin  600 mg Oral BID   insulin aspart  0-15 Units Subcutaneous TID WC   insulin aspart  0-5 Units Subcutaneous QHS   insulin glargine  8 Units Subcutaneous Daily   metoCLOPramide  10 mg Oral TID AC & HS   midodrine  10 mg Oral TID WC   multivitamin  1 tablet Oral QHS   Muscle Rub   Topical BID   pantoprazole  40 mg Oral Daily   polyethylene glycol  17 g Oral BID   rosuvastatin  10 mg Oral Daily   scopolamine  1 patch Transdermal Q72H   senna-docusate  2 tablet Oral BID   sorbitol  30 mL Oral Daily   warfarin  2 mg Oral ONCE-1600   Warfarin - Pharmacist Dosing Inpatient   Does not apply q1600    Infusions:  sodium chloride 10 mL (11/17/20 1430)    PRN Medications: sodium chloride, acetaminophen, alteplase, alum & mag hydroxide-simeth, bisacodyl, dextrose, diphenhydrAMINE, guaiFENesin-dextromethorphan, heparin, lidocaine-prilocaine, ondansetron (ZOFRAN) IV, oxyCODONE, pentafluoroprop-tetrafluoroeth, prochlorperazine **OR** prochlorperazine **OR** prochlorperazine, sodium chloride flush, sodium phosphate, traZODone, white petrolatum    Assessment/Plan   1. Acute on chronic systolic HF -> cardiogenic shock:  ICM. Has Medtronic ICD. EF 15%. Low output persisted despite milrinone and then dobutamine with rise in creatinine to 2.5. Impella 5.5 placed 3/1.  Creatinine improved to 1.7 and HM3 LVAD was placed on 3/10. Developed post-op renal failure requiring CVVH. Now  off milrinone and epinephrine. Last echo showed moderate RV enlargement with mildly decreased RV function and VAD speed decreased to 5300 due to left-sided septum. CRRT stopped 3/23 but restarted 3/24. CVVH stopped again on 3/27. Ramp echo 3/30 with speed decreased to 5200. Tolerating iHD for volume management.   Maps 90-100s  - Continue Midodrine  10 mg tid due to dizzinness/orthostatic.  - Continue compression stockings.  - HD for volume management - Continue midodrine 10 tid.  2. LVAD: 3/13 VAD speed increased to 5500.  3/23 speed decreased to 5300 and decreased to 5200 on 3/30. VAD interrogated personally. LDH 175   INR 2.1 today  - On warfarin, on ASA 81 daily.  Discussed warfarin dosing with PharmD personally. - DL site ok 3. CAD s/p CABG 2002:  Last cath in 6/19 with patent grafts. - Continue Crestor 40 mg daily.  4. AKI on CKD Stage 3b: Suspect that this is a combination of cardiorenal syndrome and diabetic nephropathy and possibly ATN. Developed post-op AKI. Started on CRRT 3/15. CVVH stopped 3/23 but restarted 3/24. Now getting iHD, looks like he will be HD-dependent.  Has tunneled catheter.  - HD T/Th/S - Dr Augustin Coupe to talk with VVS, but with continuous flow LVAD, think AV fistula would be less likely to mature.  Will await decision by VVS and nephrology in terms of access.  5. Atrial flutter/fibrillation: s/p DC-CV 08/21/18.  - 4/26 Off amio.  6. Wound R Foot:  Resolved.  7. Right arm pain: Likely neuropathic from stretch of brachial plexus with Impella placement.  - Continue gabapentin. Able to move right hand a little better.  8. ID:  WBCs normal.  9. DM: History of very poor control but hgbA1c trending down. Most recent 7.7%.  Insulin drip stopped 3/24. Blood glucose controlled on reduced Lantus and  sliding scale insulin to moderate.   - Diabetes coordinators have seen 10. Anemia: CBC tomorrow.  11. Deconditioning - Continue to work with CIR 12. Unstageable Pressure Ulcer on  buttocks: Much improved 100% granulation.  - Hydrotherapy completed - Continue repositioning 13. NSVT: Stop amio with tremors.  14. Dizziness: Still with symptoms that seem like BPPV at times. Has had Epley maneuvers.  15. Suspected Gastroparesis. Ongoing nausea. On reglan. Adding erythromycin today.    Tremors- 4/26 Off amio and see if this helps with tremors.   Amy Clegg NP-C  11/30/2020  Patient seen with NP, agree with the above note.   Still with nausea, ?gastroparesis.  Will add erythromycin.   Renal following, will likely aim for AV graft for permanent access.  Will need to be seen by vascular.  Loralie Champagne 11/30/2020

## 2020-11-30 NOTE — Progress Notes (Signed)
Patient ID: Marcus Englert., male   DOB: 05/23/1961, 60 y.o.   MRN: [id number]  DME ordered: RW, transport chair with [facility] via parachute.  SW met with in room to provide updates from team conference and [name] on phone with him. SW discuss H[facility] preference, and inform on DME ordered. Preferred [facility] is [facility]. SW confirms family edu on Friday 9am-12pm.  SW sent referral to [facility] and waiting on follow-up.   Loralee Pacas, MSW, [place] Office: [phone] Cell: [phone] Fax: [phone]

## 2020-11-30 NOTE — Progress Notes (Signed)
Physical Therapy Session Note  Patient Details  Name: Marcus Johnson. MRN: [id number] Date of Birth: [birth year]  Today's Date: 11/30/2020 PT Individual Time: 1335-1435 PT Individual Time Calculation (min): 60 min   Short Term Goals: Week 3:  PT Short Term Goal 1 (Week 3): STG=LTG due to ELOS.  Skilled Therapeutic Interventions/Progress Updates:     Patient in bed upon PT arrival. Patient alert and agreeable to PT session. Patient denied pain during session.  Therapeutic Activity: Bed Mobility: Patient performed supine to sit with supervision in a flat bed without use of bed rail to simulate home set-up. Switched patient from monitor to battery power for LVAD with total A due to upper extremity coordination deficits. Patient continues to recall steps without cues and asks appropriate questions about placing extra batteries and bag on the w/c to have them with [place] during session. Transfers: Patient performed sit to/from stand with min A from low bed without bed rail, otherwise with supervision from w/c using arm rest with and without RW. Provided verbal cues for hand placement x1 and forward weight shift.  Gait Training:  Patient ambulated 205 feet using RW with CGA with w/c follow for safety due to decreased activity tolerance. Performed 3 standing rest breaks to "reset" due to intermittent lower extremity shaking/jerking. Ambulated with decreased gait speed, decreased step length and height, and decreased trunk rotation and visual scanning. Provided verbal cues for paced breathing, increased step length, and visual scanning for improved environmental awareness.  Patient ascended/descended 7 6" steps x3 and 5 6" steps using L rail with B upper extremity support with CGA-min A simulating home entry with use of w/c for sitting rest breaks between flights. Performed step-to gait pattern leading with R while ascending and L while descending. Provided cues for technique and sequencing.   Patient required >5 min rest breaks between stair trials due to fatigue. Provided education on stair safety, +2 assist for chair management and guarding, use of manual w/c, and energy conservation strategies during rest breaks.  LVAD coordinator came by during stair training. Provided update on patient's mobility progress, challenges with home entry, limited patient progress with self-management of LVAD, however, consistent ability to verbalize clear cues. Discussed potential for extension of time in CIR due to challenges with stair management for home entry. Will assess patient's performance following dialysis tomorrow to determine need for extension, rehab team also made aware.   Patient in w/c on battery packs at end of session, nursing made aware, with breaks locked and all needs within reach.    Therapy Documentation Precautions:  Precautions Precautions: Fall,Sternal,Other (comment) Precaution Comments: LVAD, sacral wound, Left BPPV Required Braces or Orthoses: Other Brace Other Brace: thumb loop and buddy strap R hand Restrictions Weight Bearing Restrictions: No RUE Weight Bearing:  (sternal) Other Position/Activity Restrictions: sternal precautions   Therapy/Group: Individual Therapy  [name] PT, DPT  11/30/2020, 8:39 PM

## 2020-11-30 NOTE — Plan of Care (Signed)
  Problem: Consults Goal: RH GENERAL PATIENT EDUCATION Description: See Patient Education module for education specifics. Outcome: Progressing Goal: Skin Care Protocol Initiated - if Braden Score 18 or less Description: If consults are not indicated, leave blank or document N/A Outcome: Progressing Goal: Diabetes Guidelines if Diabetic/Glucose > 140 Description: If diabetic or lab glucose is > 140 mg/dl - Initiate Diabetes/Hyperglycemia Guidelines & Document Interventions  Outcome: Progressing   Problem: RH SKIN INTEGRITY Goal: RH STG SKIN FREE OF INFECTION/BREAKDOWN Description: Skin to remain free from additional breakdown or infection with mod I assist. Outcome: Progressing Goal: RH STG MAINTAIN SKIN INTEGRITY WITH ASSISTANCE Description: STG Maintain Skin Integrity With supervision Assistance. Outcome: Progressing Goal: RH STG ABLE TO PERFORM INCISION/WOUND CARE W/ASSISTANCE Description: STG Able To Perform Incision/Wound Care With supervision Assistance. Outcome: Progressing   Problem: RH SAFETY Goal: RH STG ADHERE TO SAFETY PRECAUTIONS W/ASSISTANCE/DEVICE Description: STG Adhere to Safety Precautions With supervision Assistance/Device. Outcome: Progressing   Problem: RH PAIN MANAGEMENT Goal: RH STG PAIN MANAGED AT OR BELOW PT'S PAIN GOAL Description: <4 on a 0-10 pain scale. Outcome: Progressing   Problem: RH KNOWLEDGE DEFICIT GENERAL Goal: RH STG INCREASE KNOWLEDGE OF SELF CARE AFTER HOSPITALIZATION Description: Patient will demonstrate knowledge of medication management, dietary restrictions, fluid restrictions, skin/wound care, and dressing changes with educational materials and handouts provided by staff, at discharge with independence. Outcome: Progressing

## 2020-11-30 NOTE — Progress Notes (Signed)
ANTICOAGULATION CONSULT NOTE - Follow Up Consult  Pharmacy Consult for Warfarin Indication: LVAD  Allergies  Allergen Reactions  . Meclizine Hcl Anaphylaxis and Swelling  . Ivabradine Nausea Only    Patient Measurements: Height: 5' 10.5" (179.1 cm) Weight: 78.4 kg (172 lb 12.8 oz) IBW/kg (Calculated) : 74.15 kg  Vital Signs: Temp: 98.3 F (36.8 C) (04/27 0334) Temp Source: Oral (04/27 0334) BP: 106/72 (04/27 0700) Pulse Rate: 91 (04/27 0334)  Labs: Recent Labs    11/28/20 0436 11/29/20 0413 11/30/20 0400  HGB  --  9.1*  --   HCT  --  30.3*  --   PLT  --  198  --   LABPROT 24.1* 24.0* 23.8*  INR 2.2* 2.1* 2.1*  CREATININE 5.67* 6.72* 4.09*    Estimated Creatinine Clearance: 20.2 mL/min (A) (by C-G formula based on SCr of 4.09 mg/dL (H)).   Assessment: 60 yo male s/p new LVAD implant with HeartMate III on 3/10. Warfarin started per MD on 3/12, and pharmacy has now been consulted to take over warfarin dosing.   INR stable and therapeutic atf 2.1 today. The patient has been very sensitive to small warfarin dose changes. Continues to struggle with daily nausea/vomiting/constipation. No bleeding issues noted. CBC and LDH stable.  Tremors also noted by patient. We discussed possible medication causes and working with rehab and cards team to address. We will hold amio for starters and follow closely. May need higher doses of warfarin going forward.   Adding erythromycin for suspected gastroparesis/nausea, which may make dosing Coumadin even more challenging.  Goal of Therapy: INR 2-2.5 Monitor platelets by anticoagulation protocol: Yes   Plan:  Warfarin 2 mg tonight Daily PT/INR  Monitor for signs and symptoms of bleeding  Marguerite Olea, [facility] Clinical Pharmacist  11/30/2020 10:37 AM   [facility] pharmacy phone numbers are listed on [email or web address]

## 2020-11-30 NOTE — Plan of Care (Signed)
Goals downgraded d/t fluctuating level of function   Problem: RH Dressing Goal: LTG Patient will perform lower body dressing w/assist (OT) Description: LTG: Patient will perform lower body dressing with assist, with/without cues in positioning using equipment (OT) Flowsheets (Taken 11/30/2020 1253) LTG: Pt will perform lower body dressing with assistance level of: (downgraded 4/27) Minimal Assistance - Patient > 75%   Problem: RH Toileting Goal: LTG Patient will perform toileting task (3/3 steps) with assistance level (OT) Description: LTG: Patient will perform toileting task (3/3 steps) with assistance level (OT)  Flowsheets (Taken 11/30/2020 1253) LTG: Pt will perform toileting task (3/3 steps) with assistance level: (downgraded 4/27) Moderate Assistance - Patient 50 - 74%   Problem: RH Toilet Transfers Goal: LTG Patient will perform toilet transfers w/assist (OT) Description: LTG: Patient will perform toilet transfers with assist, with/without cues using equipment (OT) Flowsheets (Taken 11/30/2020 1253) LTG: Pt will perform toilet transfers with assistance level of: Contact Guard/Touching assist

## 2020-11-30 NOTE — Progress Notes (Signed)
Occupational Therapy Session Note  Patient Details  Name: Marcus Lindsay Derasmo Jr. MRN: [id number] Date of Birth: 09/03/1960  Today's Date: 11/30/2020 OT Individual Time: 0915-1030 OT Individual Time Calculation (min): 75 min    Short Term Goals: Week 3:  OT Short Term Goal 1 (Week 3): Continue working toward established LTG  Skilled Therapeutic Interventions/Progress Updates:    Pt supine in bed with 1/10 pain in his R arm, described as sore. Pt reported nausea/vomiting this morning. Pt completed bed mobility to EOB with supervision using bed rail. He required min A to stand and then CGA to pivot to his w/c. Pt completed oral care and grooming tasks at the sink with set up assist. Pt was able to provide caregiver instructions to switch LVAD to battery power, as well as name all items needing to come with him in his black bag. Pt was taken to the therapy gym. He used the BITS to stand and complete functional reaching activity for 2 min blocks of time, focusing on using his RUE for NMR. First trial was 90% accurate and 2.64 seconds. Second trial pt completed 2 min with no UE support at all, supervision level, 1.72 sec and 94%. Pt then completed 50 ft of functional mobility at supervision to CGA level with the RW. He became fatigued quickly and required seated rest break. In the therapy gym pt completed reciprocal stepping activity with 4 in step using the RW with supervision. Pt returned to his room and was left sitting up in the w/c with all needs met.   Therapy Documentation Precautions:  Precautions Precautions: Fall,Sternal,Other (comment) Precaution Comments: LVAD, sacral wound, Left BPPV Required Braces or Orthoses: Other Brace Other Brace: thumb loop and buddy strap R hand Restrictions Weight Bearing Restrictions: No RUE Weight Bearing:  (sternal) Other Position/Activity Restrictions: sternal precautions   Therapy/Group: Individual Therapy  Sandra H Davis 11/30/2020, 9:29 AM  

## 2020-11-30 NOTE — Progress Notes (Signed)
LVAD Coordinator Rounding Note:  HM III LVAD implanted on 10/13/20 by Dr Cyndia Bent under Destination Therapy criteria.  Discharged from [facility] and admitted to [facility] 11/14/20.  Pt lying in bed this morning. Pt states that he is very nauseous today. Pt continues on Reglan. Amiodarone was stopped yesterday.  Per OT memory assessment pt found to have mild short term memory deficits.   No family at bedside for dressing change education.   Pt has HD scheduled on Tues/Thurs/Sat. Tolerating well. Tunneled catheter in place.   Vital signs: Temp: 98.3 HR: 81 Doppler: 98 Auto cuff: not done O2 Sat: 100% on RA Wt: 183.6>190.9>194>185.4>188.6>184.2>181.6>176.3>178.4>180>172.8 lbs    LVAD interrogation reveals:  Speed: 5200 Flow: 4.3  Power: 3.8w PI: 4.3 Hematocrit: 30  Alarms: none Events: 3 yest; none today  Fixed speed: 5200 Low speed limit: 4900  Drive Line: CDI. Anchor secure. Next dressing change will be due 12/01/20 using acell. No family available for dressing change.   Labs:  LDH trend: 199>191>196>180>174>168>182>177>287>168>163>175  INR trend: 1.5>1.7>1.9>2.4>3.2>2.9>3.2>2.9>2.2>2.1  Anticoagulation Plan: -INR Goal: 2.0-2.5 -ASA Dose: stopped 30 days post op per protocol  Device:  -  Medtronic single -  Pacing: VVI 40 - Therapies: on 200 bpm  Plan/Recommendations:  1. Call VAD coordinator for equipment or drive line issues 2. Twice weekly dressing changes while using acell. Next dressing due 12/01/20.  Tanda Rockers RN [place] Coordinator  Office: [phone]  24/7 Pager: [phone]

## 2020-11-30 NOTE — Progress Notes (Signed)
[facility] Progress Note    Assessment/ Plan:    Acute kidney injury on chronic kidney disease due to cardiorenal syndrome: nitially required CRRT and then transition to intermittent hemodialysis since 3/28.  No sign of renal recovery so far and tolerating dialysis.  Status post TDC placed by IR on 11/07/2020. Last HD on 4/23. Last HD 4/26. He was not in HD chair for dialysis and he states there was no chair available. Plan for AM HD tomorrow so to assess pt's ability to work with therapists after HD. Renal navigator is following for outpatient HD center arrangement.    Acute exacerbation of chronic systolic heart failure with cardiogenic shock:LVAD, placed 3/10. Volume status managed by dialysis.  On midodrine and warfarin.  Cardiology following   Anemia of CKD and critical illness:Stable. Continue Aranesp.  Renal osteodystrophy: Phosphorous 3.8.  Atrial fibrillation: per cardiology, on amiodarone  Protein calorie malnutrition, hypoalbuminemia: push protein, per primary  DM II: per primary  Dispo: patient able to do HD in chair; working on setting up Outpatient dialysis Subjective:   Continues to have nausea and vomited this morning. He denies pain. Looks forward to working with therapists. Reports not getting in HD chair yesterday as there was non available. He was transported in his bed.    Objective:   BP 106/72   Pulse 91   Temp 98.3 F (36.8 C) (Oral)   Resp 18   Ht 5' 10.5" (1.791 m)   Wt 78.4 kg   SpO2 100%   BMI 24.44 kg/m   Intake/Output Summary (Last 24 hours) at 11/30/2020 0856 Last data filed at 11/30/2020 0730 Gross per 24 hour  Intake 60 ml  Output 900 ml  Net -840 ml   Weight change: 0.153 kg  Physical Exam: GEN: pleasant elderly male, in no acute distress  CV: regular rate, LVAD mechanical hum RESP: no increased work of breathing, clear to ascultation bilaterally ABD: Bowel sounds present. Soft, nontender MSK: no LE  edema SKIN: warm, dry    Imaging: No results found.  Labs: BMET Recent Labs  Lab 11/24/20 0454 11/25/20 0445 11/26/20 0416 11/27/20 0410 11/28/20 0436 11/29/20 0413  NA 137 135 136 134* 135 136  K 4.6 4.1 4.2 4.3 4.2 4.7  CL 99 97* 98 98 98 96*  CO2 27 28 27 28 27 26   GLUCOSE 86 71 97 107* 100* 117*  BUN 54* 25* 41* 28* 43* 55*  CREATININE 6.66* 4.34* 5.93* 4.40* 5.67* 6.72*  CALCIUM 9.3 9.2 9.2 9.2 9.3 9.5  PHOS 6.0* 4.3 5.3* 3.8 5.0* 5.3*   CBC Recent Labs  Lab 11/25/20 0445 11/26/20 0416 11/27/20 0410 11/29/20 0413  WBC 7.9 8.9 9.3 9.7  HGB 8.6* 8.6* 9.1* 9.1*  HCT 28.7* 29.2* 31.0* 30.3*  MCV 90.0 90.1 89.6 88.3  PLT 140* 171 171 198    Medications:    . (feeding supplement) PROSource Plus  30 mL Oral BID BM  . vitamin C  1,000 mg Oral Daily  . Chlorhexidine Gluconate Cloth  6 each Topical Q0600  . Chlorhexidine Gluconate Cloth  6 each Topical BID  . Chlorhexidine Gluconate Cloth  6 each Topical Q0600  . darbepoetin (ARANESP) injection - DIALYSIS  100 mcg Intravenous Q Tue-HD  . feeding supplement (NEPRO CARB STEADY)  237 mL Oral BID BM  . gabapentin  100 mg Oral QHS  . guaiFENesin  600 mg Oral BID  . insulin aspart  0-15 Units Subcutaneous TID WC  . insulin  aspart  0-5 Units Subcutaneous QHS  . insulin glargine  8 Units Subcutaneous Daily  . metoCLOPramide  10 mg Oral TID AC & HS  . midodrine  10 mg Oral TID WC  . multivitamin  1 tablet Oral QHS  . Muscle Rub   Topical BID  . pantoprazole  40 mg Oral Daily  . polyethylene glycol  17 g Oral BID  . rosuvastatin  10 mg Oral Daily  . scopolamine  1 patch Transdermal Q72H  . senna-docusate  2 tablet Oral BID  . sorbitol  30 mL Oral Daily  . Warfarin - Pharmacist Dosing Inpatient   Does not apply [place], DO 11/30/2020, 8:56 AM

## 2020-11-30 NOTE — Progress Notes (Signed)
Renal Navigator received update from Dr. Hans Eden Director at [facility] that he is content to accept patient for outpatient HD at this time given the progress he is made and how well he is tolerating inpatient HD thus far. Clinic Manager is in process of submitting all documents to [facility] and ensuring all staff are trained for new LVAD patient. Per Clinic Manager, there is not currently an open seat for patient, but she is working to arrange the schedule to accommodate him as soon as possible. Navigator provided update to CIR CSW/A. Barbra Sarks. Navigator will continue to follow closely.  Alphonzo Cruise, [place] Renal Navigator [phone]

## 2020-12-01 LAB — RENAL FUNCTION PANEL
Albumin: 2.6 g/dL — ABNORMAL LOW (ref 3.5–5.0)
Anion gap: 11 (ref 5–15)
BUN: 41 mg/dL — ABNORMAL HIGH (ref 6–20)
CO2: 28 mmol/L (ref 22–32)
Calcium: 8.7 mg/dL — ABNORMAL LOW (ref 8.9–10.3)
Chloride: 96 mmol/L — ABNORMAL LOW (ref 98–111)
Creatinine, Ser: 5.59 mg/dL — ABNORMAL HIGH (ref 0.61–1.24)
GFR, Estimated: 11 mL/min — ABNORMAL LOW (ref >60)
Glucose, Bld: 101 mg/dL — ABNORMAL HIGH (ref 70–99)
Phosphorus: 4.8 mg/dL — ABNORMAL HIGH (ref 2.5–4.6)
Potassium: 4.2 mmol/L (ref 3.5–5.1)
Sodium: 135 mmol/L (ref 135–145)

## 2020-12-01 LAB — GLUCOSE, CAPILLARY
Glucose-Capillary: 88 mg/dL (ref 70–99)
Glucose-Capillary: 86 mg/dL (ref 70–99)
Glucose-Capillary: 155 mg/dL — ABNORMAL HIGH (ref 70–99)
Glucose-Capillary: 183 mg/dL — ABNORMAL HIGH (ref 70–99)

## 2020-12-01 LAB — PROTIME-INR
INR: 2.3 — ABNORMAL HIGH (ref 0.8–1.2)
Prothrombin Time: 24.9 seconds — ABNORMAL HIGH (ref 11.4–15.2)

## 2020-12-01 LAB — LACTATE DEHYDROGENASE: LDH: 168 U/L (ref 98–192)

## 2020-12-01 LAB — BRAIN NATRIURETIC PEPTIDE: B Natriuretic Peptide: 740.4 pg/mL — ABNORMAL HIGH (ref 0.0–100.0)

## 2020-12-01 LAB — MAGNESIUM: Magnesium: 2.1 mg/dL (ref 1.7–2.4)

## 2020-12-01 MED ORDER — HEPARIN SODIUM (PORCINE) 1000 UNIT/ML IJ SOLN
INTRAMUSCULAR | Status: AC
  Filled 2020-12-01: qty 4

## 2020-12-01 MED ORDER — WARFARIN SODIUM 2 MG PO TABS
2.0000 mg | ORAL_TABLET | Freq: Once | ORAL | Status: AC
  Administered 2020-12-01: 2 mg via ORAL
  Filled 2020-12-01: qty 1

## 2020-12-01 MED ORDER — CHLORHEXIDINE GLUCONATE CLOTH 2 % EX PADS: 6.0000 | MEDICATED_PAD | Freq: Two times a day (BID) | CUTANEOUS | Status: DC

## 2020-12-01 MED ORDER — SODIUM CHLORIDE 0.9 % IV SOLN
INTRAVENOUS | Status: DC | PRN
  Administered 2020-12-01: 250 mL via INTRAVENOUS

## 2020-12-01 NOTE — Progress Notes (Addendum)
Physical Therapy Session Note  Patient Details  Name: Marcus Johnson. MRN: [id number] Date of Birth: [birth year]  Today's Date: 12/01/2020 PT Individual Time: 1445-1515 PT Individual Time Calculation (min): 30 min  Short Term Goals: Week 3:  PT Short Term Goal 1 (Week 3): STG=LTG due to ELOS.  Skilled Therapeutic Interventions/Progress Updates:      1315 and 1415 Attempted to see patient for PT session x2, however patient had not yet returned from dialysis on first trial and was eating lunch on second trial. Patient agreeable to PT session following lunch.  1445: Patient sitting EOB upon PT arrival. Patient alert and agreeable to PT session. Patient denied pain during session.  Switched patient from LVAD monitor to battery packs and packed black bag with extra batteries at beginning of session.   Focus of session on assessment of patient's symptom response and activity tolerance during stair training following dialysis to simulate return from home following dialysis at d/c. Patient has 5 steps + 3x7 steps (26 steps total) with landings between each set of steps to reach his third floor apartment. Plan to have patient rest at each landing with +2 assist if available for w/c and RW management and physical assist. Patient reports family is not physically able to bump him up steps in the w/c.  Patient reported nausea/vomiting following eating only a small amount of his lunch. Reports Dialysis affects his appetite.   Therapeutic Activity: Transfers: Patient performed sit to/from stand x2 with supervision with RW and x1 with CGA without RW. Assist for safety/balance, patient able to teach back hand placement and forward weight shift cues provided in previous sessions.  Wheelchair Mobility:  Patient was transported in the w/c with total A throughout session for energy conservation and time management.  Gait Training:  Patient ascended/descended 4 steps x2 using L rail with B upper  extremity support with CGA-min A and second person SBA for safety. Performed step-to gait pattern leading with R while ascending and L while descending. Patient reported severe "wooziness" described as "feeling like I am going to pass out" with lower extremity shaking/jerking movements and was unable to continue on both trials.   Demonstrated use of modified shower chair as a safety option to sit on the steps if symptom onset occurs, however, he declined trying this on second attempt, stating he needed to stop.  Provided rest breaks after each trial with symptom improvement.   Discussed concerns about home access following dialysis at this time and need for extension of d/c date for improved activity tolerance for safe home enry, patient in agreement and shared PT's concerns. Will discuss with Rehab Team about extending d/c and continuing am dialysis to allow patient to work on stair training following dialysis during PT sessions.  Patient in w/c handed off to [place], [place], at end of session.    Therapy Documentation Precautions:  Precautions Precautions: Fall,Sternal,Other (comment) Precaution Comments: LVAD, sacral wound, Left BPPV Required Braces or Orthoses: Other Brace Other Brace: thumb loop and buddy strap R hand Restrictions Weight Bearing Restrictions: No RUE Weight Bearing:  (sternal precautions) Other Position/Activity Restrictions: sternal precautions   Therapy/Group: Individual Therapy  Kloie Whiting L Ellyce Lafevers PT, DPT  12/01/2020, 6:25 PM

## 2020-12-01 NOTE — Procedures (Signed)
Patient was seen on dialysis and the procedure was supervised.  BFR 400 Via TDC BP is 104/74. Patient appears to be tolerating treatment well.  Heidi Maclin Tanna Furry 12/01/2020

## 2020-12-01 NOTE — Progress Notes (Signed)
ANTICOAGULATION CONSULT NOTE - Follow Up Consult  Pharmacy Consult for Warfarin Indication: LVAD  Allergies  Allergen Reactions  . Meclizine Hcl Anaphylaxis and Swelling  . Ivabradine Nausea Only    Patient Measurements: Height: 5' 10.5" (179.1 cm) Weight: 78.4 kg (172 lb 13.5 oz) IBW/kg (Calculated) : 74.15 kg  Vital Signs: Temp: 99 F (37.2 C) (04/28 0005) Temp Source: Oral (04/28 0005) BP: 110/72 (04/28 0005) Pulse Rate: 100 (04/28 0005)  Labs: Recent Labs    11/29/20 0413 11/30/20 0400 12/01/20 0231  HGB 9.1*  --   --   HCT 30.3*  --   --   PLT 198  --   --   LABPROT 24.0* 23.8* 24.9*  INR 2.1* 2.1* 2.3*  CREATININE 6.72* 4.09* 5.59*    Estimated Creatinine Clearance: 14.7 mL/min (A) (by C-G formula based on SCr of 5.59 mg/dL (H)).   Assessment: 60 yo male s/p new LVAD implant with HeartMate III on 3/10. Warfarin started per MD on 3/12, and pharmacy has now been consulted to take over warfarin dosing.   INR stable and therapeutic atf 2.1 today. The patient has been very sensitive to small warfarin dose changes. Continues to struggle with daily nausea/vomiting/constipation. No bleeding issues noted. CBC and LDH stable.  Added erythromycin for suspected gastroparesis/nausea on 4/27 this may make dosing Coumadin even more challenging.  INR continues to be at goal this morning at 2.3.   Goal of Therapy: INR 2-2.5 Monitor platelets by anticoagulation protocol: Yes   Plan:  Warfarin 2 mg tonight Daily PT/INR  Monitor for signs and symptoms of bleeding  Erin Hearing PharmD., BCPS Clinical Pharmacist 12/01/2020 9:03 AM  [facility] pharmacy phone numbers are listed on [email or web address]

## 2020-12-01 NOTE — Progress Notes (Signed)
LVAD Coordinator Rounding Note:  HM III LVAD implanted on 10/13/20 by Dr Cyndia Bent under Destination Therapy criteria.  [facility] 11/14/20.  Pt lying in bed this morning. Patient reports nausea has improved, he has "not thrown up this morning". He is awaiting dialysis, reports they are running late today. Nurse in room and updated patient Dialysis is preparing machine and should be up to get him in about an hour.   Dr. Naaman Plummer in room, assessed patient; pleased with his progress. Plan is to see how much staminal patient has today after dialysis treatment is complete. Pt aware of plan and agrees with same.    Vital signs: Temp: 98.6 HR: 71 Doppler: 102 Auto cuff: 109/84 (93) O2 Sat: 99% on RA Wt: 183.6>190.9..........181.6>176.3>178.4>180>172.8>170.8 lbs    LVAD interrogation reveals:  Speed: 5200 Flow: 4.3  Power: 3.8w PI: 4.3 Hematocrit: 30  Alarms: none Events: 3 yest; none today  Fixed speed: 5200 Low speed limit: 4900  Drive Line:  Existing VAD dressing removed and site care performed using sterile technique. Drive line exit site cleaned with Chlora prep applicators x 2,acell paste placed in driveline site along with sterile ky jelly,and gauzedressingplaced over the site.Site continues to improve.Exit site healingand partially incorporated, the velour is fully implanted at exit site. Moderate amount of yellow drainage on gauze, no redness, tenderness, foul odor or rash noted. Drive line anchor re-applied.Continuetwice weekly dressing changesusing acell. Next dressing change will be due 12/05/20. No family available for dressing change.    Labs:  LDH trend: 199>191>196>180>174>168>182>177>287>168>163>175>168  INR trend: 1.5>1.7>1.9>2.4>3.2>2.9>3.2>2.9>2.2>2.1>2.3  Anticoagulation Plan: -INR Goal: 2.0-2.5 -ASA Dose: stopped 30 days post op per protocol  Device:  -  Medtronic single -  Pacing: VVI 40 - Therapies: on 200  bpm  Plan/Recommendations:  1. Call VAD coordinator for equipment or drive line issues 2. Twice weekly dressing changes while using acell. Next dressing due 12/05/20.   Zada Girt RN [place] Coordinator  Office: [phone]  24/7 Pager: [phone]

## 2020-12-01 NOTE — Progress Notes (Signed)
Round on patient today in HD secondary to dialysis tolerance. Patient is tolerating well and completing treatment in the dialysis chair. Patient reports that he was able to go up the stairs during his therapy treatments and tolerated well also. Patient plan to discharge home next week. Will continue to follow as appropriate.   Dorthey Sawyer, RN  Dialysis Nurse Coordinator [phone]

## 2020-12-01 NOTE — Progress Notes (Signed)
Occupational Therapy Session Note  Patient Details  Name: Marcus Johnson. MRN: [id number] Date of Birth: 1960/08/24  Today's Date: 12/01/2020 OT Individual Time: 5597-4163 OT Individual Time Calculation (min): 25 min    Short Term Goals: Week 1:  OT Short Term Goal 1 (Week 1): Pt will don pants with min A OT Short Term Goal 1 - Progress (Week 1): Progressing toward goal OT Short Term Goal 2 (Week 1): Pt will tolerate standing at the sink for ADL with no c/o light headness OT Short Term Goal 2 - Progress (Week 1): Met OT Short Term Goal 3 (Week 1): Pt will complete toilet transfer with min A OT Short Term Goal 3 - Progress (Week 1): Met Week 2:  OT Short Term Goal 1 (Week 2): Pt will complete toilet transfer with supervision OT Short Term Goal 1 - Progress (Week 2): Not met OT Short Term Goal 2 (Week 2): Pt will complete LB dressing with CGA OT Short Term Goal 2 - Progress (Week 2): Not met OT Short Term Goal 3 (Week 2): Pt will appropriately instruct a caregiver to change LVAD to battery power OT Short Term Goal 3 - Progress (Week 2): Met Week 3:  OT Short Term Goal 1 (Week 3): Continue working toward established LTG   Skilled Therapeutic Interventions/Progress Updates:    Pt greeted at time of session sitting up in wheelchair hand off from previous OT. Stating pt having trouble donning footwear, focus of session on donning footwear both tennis shoes and vans with adding elastic laces to tennis shoes and using shoe funnel/reacher as AE to adhere to sternal precautions, able to don both with Mod A. Pt receptive to education. Pt up in wheelchair for next session, alarm on call bell in reach.   Therapy Documentation Precautions:  Precautions Precautions: Fall,Sternal,Other (comment) Precaution Comments: LVAD, sacral wound, Left BPPV Required Braces or Orthoses: Other Brace Other Brace: thumb loop and buddy strap R hand Restrictions Weight Bearing Restrictions: No RUE Weight  Bearing:  (sternal precautions) Other Position/Activity Restrictions: sternal precautions    Therapy/Group: Individual Therapy  Viona Gilmore 12/01/2020, 4:40 PM

## 2020-12-01 NOTE — Progress Notes (Addendum)
Patient ID: Marcus Hollern., male   DOB: Nov 12, 1960, 60 y.o.   MRN: [id number]     Advanced Heart Failure Rounding Note  PCP-Cardiologist: No primary care provider on file.   Subjective:    - 2/27 Milrinone switched to dobutamine 5 mcg.  - 2/28 Moved to ICU. Dobutamine increased to 7.5 mcg and Norepi 3 mcg added. Diuretics held.  - 3/1 Impella 5.5 placed.  - 3/4 Teeth removed - 3/10 HM3 LVAD placed - 3/13 VAD speed increased to 5500. [name] out  - 3/14 Nephrology consulted. Given 1UPRBCs  - 3/15 Started CRRT. Given 1UPRBCs.  - 3/23 VAD Speed turned down to 5300  - 3/23 CRRT and Milrinone Stopped. Back in Afib - 3/24 CRRT restarted.  - 3/27 CVVH off.  - 3/28 iHD - 3/29 iHD. Walked 75 feet!! - 3/20 Ramp Echo: Fixed speed: 5200 Low speed limit: 4900 - 4/3 S/P RIJ HD catheter.  - 4/10 60th B-day!  - 4/11 Transferred to CIR. Back on heparin drip.  -4/26 amio stopped due to tremors. -4/27 Erythromycin    INR 2.3, LDH 168  Feeling better today. No nausea or tremors.    LVAD Interrogation HM 3: Speed: 5200 Flow:4.3  PI: 4 Power: 4  Rare PI events. Marland Kitchen VAD interrogated personally. Parameters stable.   Objective:   Weight Range: 78.4 kg Body mass index is 24.45 kg/m.   Vital Signs:   Temp:  [98.5 F (36.9 C)-99 F (37.2 C)] 98.6 F (37 C) (04/28 0944) Pulse Rate:  [50-112] 98 (04/28 1143) Resp:  [13-18] 13 (04/28 1143) BP: (65-114)/(46-88) 93/53 (04/28 1143) SpO2:  [96 %-99 %] 96 % (04/28 0944) Weight:  [78.4 kg] 78.4 kg (04/28 0944) Last BM Date: 11/30/20  Weight change: Filed Weights   11/30/20 0334 12/01/20 0005 12/01/20 0944  Weight: 78.4 kg 78.4 kg 78.4 kg    Intake/Output:   Intake/Output Summary (Last 24 hours) at 12/01/2020 1148 Last data filed at 11/30/2020 1725 Gross per 24 hour  Intake 472 ml  Output --  Net 472 ml      Physical Exam    Physical Exam: GENERAL: No acute distress. Seen In HD  HEENT: normal  NECK: Supple, JVP flat .  2+  bilaterally, no bruits.  No lymphadenopathy or thyromegaly appreciated.   CARDIAC:  Mechanical heart sounds with LVAD hum present.  LUNGS:  Clear to auscultation bilaterally.  ABDOMEN:  Soft, round, nontender, positive bowel sounds x4.     LVAD exit site:  Dressing dry and intact.  No erythema or drainage.  Stabilization device present and accurately applied.  Driveline dressing is being changed daily per sterile technique. EXTREMITIES:  Warm and dry, no cyanosis, clubbing, rash or edema  NEUROLOGIC:  Alert and oriented x 3.    No aphasia.  No dysarthria.  Affect pleasant.      Labs    CBC Recent Labs    11/29/20 0413  WBC 9.7  HGB 9.1*  HCT 30.3*  MCV 88.3  PLT 956   Basic Metabolic Panel Recent Labs    11/30/20 0400 12/01/20 0231  NA 136 135  K 3.5 4.2  CL 97* 96*  CO2 28 28  GLUCOSE 80 101*  BUN 27* 41*  CREATININE 4.09* 5.59*  CALCIUM 8.5* 8.7*  MG 2.0 2.1  PHOS 3.8 4.8*   Liver Function Tests Recent Labs    11/30/20 0400 12/01/20 0231  ALBUMIN 2.7* 2.6*   No results for input(s): LIPASE, AMYLASE in  the last 72 hours. Cardiac Enzymes No results for input(s): CKTOTAL, CKMB, CKMBINDEX, TROPONINI in the last 72 hours.  BNP: BNP (last 3 results) Recent Labs    11/17/20 0411 11/24/20 0454 12/01/20 0000  BNP 630.2* 723.8* 740.4*    ProBNP (last 3 results) No results for input(s): PROBNP in the last 8760 hours.   D-Dimer No results for input(s): DDIMER in the last 72 hours. Hemoglobin A1C No results for input(s): HGBA1C in the last 72 hours. Fasting Lipid Panel No results for input(s): CHOL, HDL, LDLCALC, TRIG, CHOLHDL, LDLDIRECT in the last 72 hours. Thyroid Function Tests No results for input(s): TSH, T4TOTAL, T3FREE, THYROIDAB in the last 72 hours.  Invalid input(s): FREET3  Other results:   Imaging    No results found.   Medications:     Scheduled Medications: . (feeding supplement) PROSource Plus  30 mL Oral BID BM  . vitamin C   1,000 mg Oral Daily  . Chlorhexidine Gluconate Cloth  6 each Topical BID  . darbepoetin (ARANESP) injection - DIALYSIS  100 mcg Intravenous Q Tue-HD  . erythromycin  250 mg Oral TID AC  . feeding supplement (NEPRO CARB STEADY)  237 mL Oral BID BM  . gabapentin  100 mg Oral QHS  . guaiFENesin  600 mg Oral BID  . insulin aspart  0-15 Units Subcutaneous TID WC  . insulin aspart  0-5 Units Subcutaneous QHS  . insulin glargine  8 Units Subcutaneous Daily  . metoCLOPramide  10 mg Oral TID AC & HS  . midodrine  10 mg Oral TID WC  . multivitamin  1 tablet Oral QHS  . Muscle Rub   Topical BID  . pantoprazole  40 mg Oral Daily  . polyethylene glycol  17 g Oral BID  . rosuvastatin  10 mg Oral Daily  . scopolamine  1 patch Transdermal Q72H  . senna-docusate  2 tablet Oral BID  . sorbitol  30 mL Oral Daily  . warfarin  2 mg Oral ONCE-1600  . Warfarin - Pharmacist Dosing Inpatient   Does not apply q1600    Infusions: . sodium chloride 10 mL (11/17/20 1430)    PRN Medications: sodium chloride, acetaminophen, alteplase, alum & mag hydroxide-simeth, bisacodyl, dextrose, diphenhydrAMINE, guaiFENesin-dextromethorphan, heparin, lidocaine-prilocaine, ondansetron (ZOFRAN) IV, oxyCODONE, pentafluoroprop-tetrafluoroeth, prochlorperazine **OR** prochlorperazine **OR** prochlorperazine, sodium chloride flush, sodium phosphate, traZODone, white petrolatum    Assessment/Plan   1. Acute on chronic systolic HF -> cardiogenic shock:  ICM. Has Medtronic ICD. EF 15%. Low output persisted despite milrinone and then dobutamine with rise in creatinine to 2.5. Impella 5.5 placed 3/1.  Creatinine improved to 1.7 and HM3 LVAD was placed on 3/10. Developed post-op renal failure requiring CVVH. Now off milrinone and epinephrine. Last echo showed moderate RV enlargement with mildly decreased RV function and VAD speed decreased to 5300 due to left-sided septum. CRRT stopped 3/23 but restarted 3/24. CVVH stopped again on  3/27. Ramp echo 3/30 with speed decreased to 5200. Tolerating iHD for volume management.   - Continue Midodrine  10 mg tid due to dizzinness/orthostatic.  Improved today.  - Continue compression stockings.  - HD for volume management - Continue midodrine 10 tid.  2. LVAD: 3/13 VAD speed increased to 5500. 3/23 speed decreased to 5300 and decreased to 5200 on 3/30. VAD interrogated personally. LDH 175   INR 2.1 today  - On warfarin, on ASA 81 daily.  Discussed warfarin dosing with PharmD personally. - DL site ok 3. CAD s/p CABG 2002:  Last cath in 6/19 with patent grafts. - Continue Crestor 40 mg daily.  4. AKI on CKD Stage 3b: Suspect that this is a combination of cardiorenal syndrome and diabetic nephropathy and possibly ATN. Developed post-op AKI. Started on CRRT 3/15. CVVH stopped 3/23 but restarted 3/24. Now getting iHD, looks like he will be HD-dependent.  Has tunneled catheter.  - HD T/Th/S - Dr Augustin Coupe to talk with VVS, but with continuous flow LVAD, think AV fistula would be less likely to mature.  Will await decision by VVS and nephrology in terms of access.  - Renal Navigator following for clip.  5. Atrial flutter/fibrillation: s/p DC-CV 08/21/18.  - 4/26 Off amio.  6. Wound R Foot:  Resolved.  7. Right arm pain: Likely neuropathic from stretch of brachial plexus with Impella placement.  - Continue gabapentin. Able to move right hand a little better.  8. ID:  WBCs normal.  9. DM: History of very poor control but hgbA1c trending down. Most recent 7.7%.  Insulin drip stopped 3/24. Blood glucose controlled on reduced Lantus and  sliding scale insulin to moderate.   - Diabetes coordinators have seen 10. Anemia: CBC tomorrow.  11. Deconditioning - Continue to work with CIR 12. Unstageable Pressure Ulcer on buttocks: Much improved 100% granulation.  - Hydrotherapy completed - Continue repositioning 13. NSVT: Stopped amio with tremors.  14. Dizziness: Still with symptoms that seem like  BPPV at times. Has had Epley maneuvers.  15. Suspected Gastroparesis. Ongoing nausea. On reglan. 4/27 Started  Erythromycin. No nausea today.     NO tremors today.   Amy Clegg NP-C  12/01/2020  Patient seen with NP, agree with the above note.   Tremors resolved off amiodarone.  Resolution of nausea so far off amiodarone and on erythromycin.   Tolerating HD.   Loralie Champagne 12/01/2020 1:30 PM

## 2020-12-01 NOTE — Progress Notes (Signed)
[facility] Progress Note    Assessment/ Plan:    Acute kidney injury on chronic kidney disease due to cardiorenal syndrome: nitially required CRRT and then transition to intermittent hemodialysis since 3/28.  No sign of renal recovery so far and tolerating dialysis. Vein mapping on 4/1. Status post TDC placed by IR on 11/07/2020. Patient requested to postpone permanent access at this time due. HD today so patient can work with PT/OT afterwards. Renal navigator following for outpt HD center arrangement.   Acute exacerbation of chronic systolic heart failure with cardiogenic shock:LVAD, placed 3/10. Volume status managed by dialysis.  On midodrine and warfarin.  Cardiology following   Anemia of CKD and critical illness:Stable. Continue Aranesp.  Renal osteodystrophy: Phosphorous 4.8.  Atrial fibrillation: per cardiology, on amiodarone  Protein calorie malnutrition, hypoalbuminemia: push protein, per primary  DM II: per primary  Dispo: patient tolerating HD in chair; working on setting up Outpatient dialysis  Subjective:   Denies nausea and vomiting this morning. HD chair in the room.    Objective:   BP 110/72 (BP Location: Left Arm)   Pulse 100   Temp 99 F (37.2 C) (Oral)   Resp 17   Ht 5' 10.5" (1.791 m)   Wt 78.4 kg   SpO2 99%   BMI 24.45 kg/m   Intake/Output Summary (Last 24 hours) at 12/01/2020 0843 Last data filed at 11/30/2020 1725 Gross per 24 hour  Intake 472 ml  Output --  Net 472 ml   Weight change: -3.4 kg  Physical Exam: GEN: pleasant older male, in no acute distress  CV: LVAD mechanical hum, RUE dialysis catheter RESP: no increased work of breathing, clear to ascultation bilaterally  ABD: Bowel sounds present. Soft, Nontender, Nondistended. MSK: no LE edema SKIN: warm, dry      Imaging: No results found.  Labs: BMET Recent Labs  Lab 11/25/20 0445 11/26/20 0416 11/27/20 0410 11/28/20 0436 11/29/20 0413  11/30/20 0400 12/01/20 0231  NA 135 136 134* 135 136 136 135  K 4.1 4.2 4.3 4.2 4.7 3.5 4.2  CL 97* 98 98 98 96* 97* 96*  CO2 28 27 28 27 26 28 28   GLUCOSE 71 97 107* 100* 117* 80 101*  BUN 25* 41* 28* 43* 55* 27* 41*  CREATININE 4.34* 5.93* 4.40* 5.67* 6.72* 4.09* 5.59*  CALCIUM 9.2 9.2 9.2 9.3 9.5 8.5* 8.7*  PHOS 4.3 5.3* 3.8 5.0* 5.3* 3.8 4.8*   CBC Recent Labs  Lab 11/25/20 0445 11/26/20 0416 11/27/20 0410 11/29/20 0413  WBC 7.9 8.9 9.3 9.7  HGB 8.6* 8.6* 9.1* 9.1*  HCT 28.7* 29.2* 31.0* 30.3*  MCV 90.0 90.1 89.6 88.3  PLT 140* 171 171 198    Medications:    . (feeding supplement) PROSource Plus  30 mL Oral BID BM  . vitamin C  1,000 mg Oral Daily  . Chlorhexidine Gluconate Cloth  6 each Topical BID  . darbepoetin (ARANESP) injection - DIALYSIS  100 mcg Intravenous Q Tue-HD  . erythromycin  250 mg Oral TID AC  . feeding supplement (NEPRO CARB STEADY)  237 mL Oral BID BM  . gabapentin  100 mg Oral QHS  . guaiFENesin  600 mg Oral BID  . insulin aspart  0-15 Units Subcutaneous TID WC  . insulin aspart  0-5 Units Subcutaneous QHS  . insulin glargine  8 Units Subcutaneous Daily  . metoCLOPramide  10 mg Oral TID AC & HS  . midodrine  10 mg Oral TID WC  .  multivitamin  1 tablet Oral QHS  . Muscle Rub   Topical BID  . pantoprazole  40 mg Oral Daily  . polyethylene glycol  17 g Oral BID  . rosuvastatin  10 mg Oral Daily  . scopolamine  1 patch Transdermal Q72H  . senna-docusate  2 tablet Oral BID  . sorbitol  30 mL Oral Daily  . Warfarin - Pharmacist Dosing Inpatient   Does not apply [place], DO 12/01/2020

## 2020-12-01 NOTE — Progress Notes (Signed)
Physical Therapy Session Note  Patient Details  Name: Marcus Johnson. MRN: [id number] Date of Birth: [birth year]  Today's Date: 12/01/2020 PT Individual Time: 1715-1730 PT Individual Time Calculation (min): 15 min   Short Term Goals:  Week 3:  PT Short Term Goal 1 (Week 3): STG=LTG due to ELOS.  Skilled Therapeutic Interventions/Progress Updates:   Pt received sitting in WC and agreeable to PT  gait training in rehab gym with RW 2 x 104ft with CGA for safety. Intermittent foot drag noted. Pt persevrating and talking to son about housing situation and requesting to return to room following gait training. Patient returned to room and left sitting in [facility] with call bell in reach and all needs met.        Therapy Documentation Precautions:  Precautions Precautions: Fall,Sternal,Other (comment) Precaution Comments: LVAD, sacral wound, Left BPPV Required Braces or Orthoses: Other Brace Other Brace: thumb loop and buddy strap R hand Restrictions Weight Bearing Restrictions: No RUE Weight Bearing:  (sternal precautions) Other Position/Activity Restrictions: sternal precautions    Vital Signs: Therapy Vitals Temp: 98.8 F (37.1 C) Temp Source: Oral Pulse Rate: 86 Resp: 18 BP: (!) 107/92 Patient Position (if appropriate): Sitting Oxygen Therapy SpO2: 100 % O2 Device: Room Air Pain:  denies    Therapy/Group: Individual Therapy  Lorie Phenix 12/01/2020, 6:05 PM

## 2020-12-01 NOTE — Progress Notes (Signed)
Occupational Therapy Session Note  Patient Details  Name: Marcus Johnson. MRN: [id number] Date of Birth: 04-24-1961  Today's Date: 12/01/2020 OT Individual Time: 1515-1600 OT Individual Time Calculation (min): 45 min    Short Term Goals: Week 1:  OT Short Term Goal 1 (Week 1): Pt will don pants with min A OT Short Term Goal 1 - Progress (Week 1): Progressing toward goal OT Short Term Goal 2 (Week 1): Pt will tolerate standing at the sink for ADL with no c/o light headness OT Short Term Goal 2 - Progress (Week 1): Met OT Short Term Goal 3 (Week 1): Pt will complete toilet transfer with min A OT Short Term Goal 3 - Progress (Week 1): Met  Skilled Therapeutic Interventions/Progress Updates:    1:1. Pt received with direct handoff from PT. No pain reported but mild wooziness since pt just worked ton stairs.  ADL:  Pt completes bathing with MIN-MOD A for standing balance for LB bathing. Set up for UB bathing and VC for w/c parts management to manage proximity to sink.  Pt completes UB dressing with S to doff shirt and S to doff shirt with order of HEAD, RUE, LUE.  Pt completes LB dressing with S and increased time for problem solving threading BLE (LLE first, RLE second) and MIN A for pulling pants up hips Pt completes footwear with MAX A  Direct handoff to OT for next session.   Therapy Documentation Precautions:  Precautions Precautions: Fall,Sternal,Other (comment) Precaution Comments: LVAD, sacral wound, Left BPPV Required Braces or Orthoses: Other Brace Other Brace: thumb loop and buddy strap R hand Restrictions Weight Bearing Restrictions: No RUE Weight Bearing:  (sternal precautions) Other Position/Activity Restrictions: sternal precautions General:   Vital Signs:   Pain:   ADL: ADL Eating: Set up Where Assessed-Eating: Bed level Grooming: Minimal assistance Where Assessed-Grooming: Edge of bed Upper Body Bathing: Unable to assess Lower Body Bathing:  Unable to assess Upper Body Dressing: Unable to assess Lower Body Dressing: Maximal assistance Toileting: Unable to assess Toilet Transfer: Unable to assess Tub/Shower Transfer Method: Unable to assess Gaffer Transfer: Unable to assess Vision   Perception    Praxis   Exercises:   Other Treatments:     Therapy/Group: Individual Therapy  Tonny Branch 12/01/2020, 6:54 AM

## 2020-12-01 NOTE — Progress Notes (Signed)
Patient ID: Marcus Johnson., male   DOB: [birth year], 60 y.o.   MRN: [id number]  SW received updates from [facility] who declined referral due to acuity of care as thy are not trained in LVAD.  SW sent HHPT/OT/aide/SN referral to [facility] and waiting on follow-up.  *referral declined by branch.  SW sent referral to [facility] and waiting on follow-up.  Loralee Pacas, MSW, [place] Office: [phone] Cell: [phone] Fax: [phone]

## 2020-12-02 LAB — PROTIME-INR
INR: 1.9 — ABNORMAL HIGH (ref 0.8–1.2)
Prothrombin Time: 21.4 seconds — ABNORMAL HIGH (ref 11.4–15.2)

## 2020-12-02 LAB — RENAL FUNCTION PANEL
Albumin: 2.8 g/dL — ABNORMAL LOW (ref 3.5–5.0)
Anion gap: 10 (ref 5–15)
BUN: 24 mg/dL — ABNORMAL HIGH (ref 6–20)
CO2: 29 mmol/L (ref 22–32)
Calcium: 9.2 mg/dL (ref 8.9–10.3)
Chloride: 98 mmol/L (ref 98–111)
Creatinine, Ser: 4.04 mg/dL — ABNORMAL HIGH (ref 0.61–1.24)
GFR, Estimated: 16 mL/min — ABNORMAL LOW (ref >60)
Glucose, Bld: 97 mg/dL (ref 70–99)
Phosphorus: 3.4 mg/dL (ref 2.5–4.6)
Potassium: 3.9 mmol/L (ref 3.5–5.1)
Sodium: 137 mmol/L (ref 135–145)

## 2020-12-02 LAB — GLUCOSE, CAPILLARY
Glucose-Capillary: 96 mg/dL (ref 70–99)
Glucose-Capillary: 160 mg/dL — ABNORMAL HIGH (ref 70–99)
Glucose-Capillary: 205 mg/dL — ABNORMAL HIGH (ref 70–99)
Glucose-Capillary: 122 mg/dL — ABNORMAL HIGH (ref 70–99)

## 2020-12-02 LAB — LACTATE DEHYDROGENASE: LDH: 184 U/L (ref 98–192)

## 2020-12-02 LAB — MAGNESIUM: Magnesium: 2.1 mg/dL (ref 1.7–2.4)

## 2020-12-02 MED ORDER — CHLORHEXIDINE GLUCONATE CLOTH 2 % EX PADS
6.0000 | MEDICATED_PAD | Freq: Every day | CUTANEOUS | Status: DC
  Administered 2020-12-02 – 2020-12-09 (×6): 6 via TOPICAL

## 2020-12-02 MED ORDER — WARFARIN SODIUM 3 MG PO TABS
3.0000 mg | ORAL_TABLET | Freq: Once | ORAL | Status: AC
  Administered 2020-12-02: 3 mg via ORAL
  Filled 2020-12-02: qty 1

## 2020-12-02 NOTE — Progress Notes (Signed)
[facility] Progress Note    Assessment/ Plan:    Acute kidney injury on chronic kidney disease due to cardiorenal syndrome: nitially required CRRT and then transition to intermittent hemodialysis since 3/28.  No sign of renal recovery so far and tolerating dialysis. Vein mapping on 4/1. Status post TDC placed by IR on 11/07/2020. Patient requested to postpone permanent access at this time due. HD yesterday and patient was able to work with PT/OT afterwards. Renal navigator following for outpt HD center arrangement.   Acute exacerbation of chronic systolic heart failure with cardiogenic shock:LVAD, placed 3/10. Volume status managed by dialysis.  On midodrine and warfarin.  Cardiology following   Anemia of CKD and critical illness:Stable. Continue Aranesp.  Renal osteodystrophy: Phosphorous 3.4, Ca 9.2.   Atrial fibrillation: per cardiology, on amiodarone  Protein calorie malnutrition, hypoalbuminemia: push protein, per primary  DM II: per primary  Dispo: patient tolerating HD in chair; working on setting up Outpatient dialysis  Subjective:   Son and brother at bedside.  Patient in good spirits this morning.  Some nausea but no vomiting.    Objective:   BP 134/75   Pulse 69   Temp 98.5 F (36.9 C) (Oral)   Resp 17   Ht 5' 10.5" (1.791 m)   Wt 77 kg   SpO2 94%   BMI 24.01 kg/m   Intake/Output Summary (Last 24 hours) at 12/02/2020 0912 Last data filed at 12/02/2020 0746 Gross per 24 hour  Intake 450 ml  Output 500 ml  Net -50 ml   Weight change: 0 kg  Physical Exam:  GEN: pleasant older male, in no acute distress  CV: Mechanical hum of LVAD, RUE catheter  RESP: no increased work of breathing, clear to ascultation bilaterally ABD: Bowel sounds present. Soft, non-tender MSK: no LE edema SKIN: warm, dry     Imaging: No results found.  Labs: BMET Recent Labs  Lab 11/26/20 0416 11/27/20 0410 11/28/20 0436 11/29/20 0413  11/30/20 0400 12/01/20 0231 12/02/20 0412  NA 136 134* 135 136 136 135 137  K 4.2 4.3 4.2 4.7 3.5 4.2 3.9  CL 98 98 98 96* 97* 96* 98  CO2 27 28 27 26 28 28 29   GLUCOSE 97 107* 100* 117* 80 101* 97  BUN 41* 28* 43* 55* 27* 41* 24*  CREATININE 5.93* 4.40* 5.67* 6.72* 4.09* 5.59* 4.04*  CALCIUM 9.2 9.2 9.3 9.5 8.5* 8.7* 9.2  PHOS 5.3* 3.8 5.0* 5.3* 3.8 4.8* 3.4   CBC Recent Labs  Lab 11/26/20 0416 11/27/20 0410 11/29/20 0413  WBC 8.9 9.3 9.7  HGB 8.6* 9.1* 9.1*  HCT 29.2* 31.0* 30.3*  MCV 90.1 89.6 88.3  PLT 171 171 198    Medications:    . (feeding supplement) PROSource Plus  30 mL Oral BID BM  . vitamin C  1,000 mg Oral Daily  . Chlorhexidine Gluconate Cloth  6 each Topical BID  . darbepoetin (ARANESP) injection - DIALYSIS  100 mcg Intravenous Q Tue-HD  . erythromycin  250 mg Oral TID AC  . feeding supplement (NEPRO CARB STEADY)  237 mL Oral BID BM  . gabapentin  100 mg Oral QHS  . guaiFENesin  600 mg Oral BID  . insulin aspart  0-15 Units Subcutaneous TID WC  . insulin aspart  0-5 Units Subcutaneous QHS  . insulin glargine  8 Units Subcutaneous Daily  . metoCLOPramide  10 mg Oral TID AC & HS  . midodrine  10 mg Oral TID WC  .  multivitamin  1 tablet Oral QHS  . Muscle Rub   Topical BID  . pantoprazole  40 mg Oral Daily  . polyethylene glycol  17 g Oral BID  . rosuvastatin  10 mg Oral Daily  . scopolamine  1 patch Transdermal Q72H  . senna-docusate  2 tablet Oral BID  . sorbitol  30 mL Oral Daily  . warfarin  3 mg Oral ONCE-1600  . Warfarin - Pharmacist Dosing Inpatient   Does not apply [place], DO 12/02/2020

## 2020-12-02 NOTE — Progress Notes (Signed)
PROGRESS NOTE   Subjective/Complaints: Had a pretty good night. Nausea seems better the last 2 days. Struggled even with a few stairs post-HD d/t significant light-headedness, pre-syncopal complaints  ROS: Patient denies fever, rash, sore throat, blurred vision, nausea, vomiting, diarrhea, cough,   chest pain, joint or back pain, headache, or mood change.     Objective:   No results found. No results for input(s): WBC, HGB, HCT, PLT in the last 72 hours. Recent Labs    12/01/20 0231 12/02/20 0412  NA 135 137  K 4.2 3.9  CL 96* 98  CO2 28 29  GLUCOSE 101* 97  BUN 41* 24*  CREATININE 5.59* 4.04*  CALCIUM 8.7* 9.2    Intake/Output Summary (Last 24 hours) at 12/02/2020 1111 Last data filed at 12/02/2020 0746 Gross per 24 hour  Intake 450 ml  Output 500 ml  Net -50 ml     Pressure Injury 10/16/20 Coccyx Mid Stage 3 -  Full thickness tissue loss. Subcutaneous fat may be visible but bone, tendon or muscle are NOT exposed. Blister that has drained, 2 cm X 4.5 cm (Active)  10/16/20 0800  Location: Coccyx  Location Orientation: Mid  Staging: Stage 3 -  Full thickness tissue loss. Subcutaneous fat may be visible but bone, tendon or muscle are NOT exposed. (as per WOC note on 11/14/20)  Wound Description (Comments): Blister that has drained, 2 cm X 4.5 cm  Present on Admission: No     Pressure Injury 10/16/20 Coccyx Right Stage 2 -  Partial thickness loss of dermis presenting as a shallow open injury with a red, pink wound bed without slough. blister that has drained, 0.5 cm X 0.5 cm (Active)  10/16/20 0800  Location: Coccyx  Location Orientation: Right  Staging: Stage 2 -  Partial thickness loss of dermis presenting as a shallow open injury with a red, pink wound bed without slough.  Wound Description (Comments): blister that has drained, 0.5 cm X 0.5 cm  Present on Admission: No    Physical Exam: Vital Signs Blood  pressure 134/75, pulse 69, temperature 98.5 F (36.9 C), temperature source Oral, resp. rate 17, height 5' 10.5" (1.791 m), weight 77 kg, SpO2 94 %. Constitutional: No distress . Vital signs reviewed. HEENT: EOMI, oral membranes moist Neck: supple Cardiovascular: RRR without murmur. No JVD    Respiratory/Chest: CTA Bilaterally without wheezes or rales. Normal effort    GI/Abdomen: BS +, non-tender, non-distended Ext: no clubbing, cyanosis, or edema Psych: pleasant and cooperative Skin: tunneled cath right shoulder in place.LVAD site dry, sacral wound very superficial, pink granulation tissue Neuro: Alert and oriented x 3. Normal insight and awareness. STM deficits. Normal language and speech. Cranial nerve exam unremarkable  Sensory exam is normal. Reflexes are 2+ in all 4's. Fine motor coordination is intact. R>L UE tremors mild today. Motor function is grossly 4/5 with tremor. RUE  Remains 3-4/5 but still limited d/t pain in shoulder. has scattered sensory loss throughout right arm. LE motor 3+ to 4/5 Musculoskeletal:  RUE moving more freely   Assessment/Plan: 1. Functional deficits which require 3+ hours per day of interdisciplinary therapy in a comprehensive inpatient rehab setting.  Physiatrist  is providing close team supervision and 24 hour management of active medical problems listed below.  Physiatrist and rehab team continue to assess barriers to discharge/monitor patient progress toward functional and medical goals  Care Tool:  Bathing  Bathing activity did not occur:  (time constraints) Body parts bathed by patient: Left arm,Abdomen,Chest,Front perineal area,Right upper leg,Left upper leg,Face,Right arm,Buttocks   Body parts bathed by helper: Right lower leg,Left lower leg     Bathing assist Assist Level: Moderate Assistance - Patient 50 - 74%     Upper Body Dressing/Undressing Upper body dressing Upper body dressing/undressing activity did not occur (including  orthotics): Environmental limitations (PICC running) What is the patient wearing?: Pull over shirt    Upper body assist Assist Level: Minimal Assistance - Patient > 75%    Lower Body Dressing/Undressing Lower body dressing      What is the patient wearing?: Pants     Lower body assist Assist for lower body dressing: Moderate Assistance - Patient 50 - 74%     Toileting Toileting Toileting Activity did not occur Landscape architect and hygiene only): N/A (no void or bm)  Toileting assist Assist for toileting: Dependent - Patient 0%     Transfers Chair/bed transfer  Transfers assist  Chair/bed transfer activity did not occur: Safety/medical concerns  Chair/bed transfer assist level: Contact Guard/Touching assist Chair/bed transfer assistive device: Programmer, multimedia   Ambulation assist   Ambulation activity did not occur: Safety/medical concerns  Assist level: Contact Guard/Touching assist Assistive device: Walker-rolling Max distance: 205 ft   Walk 10 feet activity   Assist  Walk 10 feet activity did not occur: Safety/medical concerns  Assist level: Contact Guard/Touching assist Assistive device: Walker-rolling   Walk 50 feet activity   Assist Walk 50 feet with 2 turns activity did not occur: Safety/medical concerns  Assist level: Contact Guard/Touching assist Assistive device: Walker-rolling    Walk 150 feet activity   Assist Walk 150 feet activity did not occur: Safety/medical concerns  Assist level: Contact Guard/Touching assist Assistive device: Walker-rolling    Walk 10 feet on uneven surface  activity   Assist Walk 10 feet on uneven surfaces activity did not occur: Safety/medical concerns         Wheelchair     Assist Will patient use wheelchair at discharge?: No (Per PT long term goals)   Wheelchair activity did not occur: Safety/medical concerns         Wheelchair 50 feet with 2 turns  activity    Assist    Wheelchair 50 feet with 2 turns activity did not occur: Safety/medical concerns       Wheelchair 150 feet activity     Assist  Wheelchair 150 feet activity did not occur: Safety/medical concerns       Blood pressure 134/75, pulse 69, temperature 98.5 F (36.9 C), temperature source Oral, resp. rate 17, height 5' 10.5" (1.791 m), weight 77 kg, SpO2 94 %.  Medical Problem List and Plan: 1.Functional and mobility deficitssecondary to debility after acute on chronic CHF with subsequent LVAD placement -patient maynotshower -ELOS/Goals: 5/2  -Continue CIR therapies including PT, OT    -pt with STM deficits  - scopolamine patch for ?vestibular symptoms  -change HD to AM schedule so that we can work on stairs afterwards   -take midodrine immediately after HD 2. Chronic systolic CHF s/p LVAD 3:  -DVT/anticoagulation:Pharmaceutical:Continue Coumadin per pharmacy -antiplatelet therapy: ASA 3.Right arm neuropathic pain/brachial plexus injury/Pain Management:Tylenol or oxycodone prn.  --  gabapentin 200 mgat bedtime with some benefit -sling prn for RUE support and comfort  -expect recovery of motor/sensory function in RUE  4/25 well controlled, continue current regimen.  4. Mood:LCSW to follow for evaluation and support. -antipsychotic agents: N/A 5. Neuropsych: This patientiscapable of making decisions on hisown behalf. 6. Skin/Wound Care:Optimize nutritional status--continue Nephro and prosource.  --Sacral decubitus: Air mattress.    -appears much improved-->change to hydrogel/gauze daily   -pt is NO LONGER RECEIVING HYDRO --Right foot wound resolved.  7. Fluids/Electrolytes/Nutrition:Strict I/O. Continue to monitor labs with HD? 8. Chronic systolic CHF s/p LVAD 3:  --Cardiology to follow and manage cardiac issues.   -4/29 weight down, continue to monitor Filed Weights   12/01/20 0944 12/01/20 1325 12/02/20 0415  Weight: 78.4 kg 77.5 kg 77 kg    bp/volume mgt per nephro and cards.    -off revatio   -TEDS, acclimation   - midodrine   10mg  tid (give dose immediately after HD) 9. Acute on chronic anemia: Continue to monitor H/H/ monitor for signs of bleeding.  --Was transfused with multiple units PRBC. --ON Nulecit Tu/Th/Sa  -transfuse as needed in HD hgb holding at 8.6 10. T2DM: Hgb A1c- monitor BS ac/hs.BS reasonable on current intake --continue Lantus in am with SSI for elevated BS.   4/23-25   improved after lantus decreased to 8u qam   -may need to readjust as he's eating more CBG (last 3)  Recent Labs    12/01/20 1747 12/01/20 2057 12/02/20 0626  GLUCAP 155* 183* 96    11. ESRD: Hyperkalemia being managed with lokelma/HD. -see above 12. Mild leucocytosis: Resolved  13. A fib: NSR, HR remains controlled on Amiodarone, continue 14. Nausea: likely d/t to peristalsis/gastroparesis---improved  -keep bowels moving  - reglan 10mg  AC/HS   -amiodarone reduced per cards        LOS: 18 days A FACE TO FACE EVALUATION WAS PERFORMED  Meredith Staggers 12/02/2020, 11:11 AM

## 2020-12-02 NOTE — Progress Notes (Signed)
Patient ID: Marcus Johnson., male   DOB: Feb 05, 1961, 60 y.o.   MRN: [id number]  4/28- SW spoke with dialysis coordinator Terri Piedra about request for first shift dialysis for next week, to continue to practice mobility training, and pt d/c date to likely be at the end of the week. SW to confirm.  4/29- medical team confirms d/c date 5/6 due to mobility training required. Request for Saturday first shift dialysis as well. SW spoke with Dialysis Coord. Terri Piedra. Will ask about first shift for tomorrow.   SW met with pt, pt son [name], and brother [name] to inform on above. Family in agreement. SW also discussed waiting on dialysis seat, and pt cannot discharge until we have this in place. Pt and family communicated understanding. SW to provide updates once available.    Loralee Pacas, MSW, [place] Office: [phone] Cell: [phone] Fax: [phone]

## 2020-12-02 NOTE — Progress Notes (Signed)
Occupational Therapy Session Note  Patient Details  Name: Marcus Johnson. MRN: [id number] Date of Birth: [birth year]  Today's Date: 12/02/2020 OT Individual Time: 0900-1000 OT Individual Time Calculation (min): 60 min    Short Term Goals: Week 1:  OT Short Term Goal 1 (Week 1): Pt will don pants with min A OT Short Term Goal 1 - Progress (Week 1): Progressing toward goal OT Short Term Goal 2 (Week 1): Pt will tolerate standing at the sink for ADL with no c/o light headness OT Short Term Goal 2 - Progress (Week 1): Met OT Short Term Goal 3 (Week 1): Pt will complete toilet transfer with min A OT Short Term Goal 3 - Progress (Week 1): Met  Skilled Therapeutic Interventions/Progress Updates:    1:1. Pt received in bed agreeable to OT. Family (son and brother present). Educated on dressing strategies however with intermittent interruptions from other healthcare providers unable to practice. Family able ot verbalize and repeat dressing order. Pt and OT ambulate to toilet and OT demo safe functional transfers, reach back, RW management, hand placement, slowing pace during turns, management/monitoring of symptoms, and steadying A. Brother and son able to carry over and dmeo safe cuing for mobility with no intervention from OT. Family requires MIN cuing from OT to change over to battery power. More practice would be beneficial. Pt placed in w/c at sink for grooming. Exited session with pt seated in bed, exit alarm on and call light in reach   Therapy Documentation Precautions:  Precautions Precautions: Fall,Sternal,Other (comment) Precaution Comments: LVAD, sacral wound, Left BPPV Required Braces or Orthoses: Other Brace Other Brace: thumb loop and buddy strap R hand Restrictions Weight Bearing Restrictions: No RUE Weight Bearing:  (sternal precautions) Other Position/Activity Restrictions: sternal precautions General:   Vital Signs: Therapy Vitals Temp: 98.5 F (36.9 C) Temp  Source: Oral Pulse Rate: 69 Resp: 17 BP: (!) 155/122 Patient Position (if appropriate): Lying Oxygen Therapy SpO2: 94 % O2 Device: Room Air Pain:   ADL: ADL Eating: Set up Where Assessed-Eating: Bed level Grooming: Minimal assistance Where Assessed-Grooming: Edge of bed Upper Body Bathing: Unable to assess Lower Body Bathing: Unable to assess Upper Body Dressing: Unable to assess Lower Body Dressing: Maximal assistance Toileting: Unable to assess Toilet Transfer: Unable to assess Tub/Shower Transfer Method: Unable to assess Gaffer Transfer: Unable to assess Vision   Perception    Praxis   Exercises:   Other Treatments:     Therapy/Group: Individual Therapy  Tonny Branch 12/02/2020, 6:51 AM

## 2020-12-02 NOTE — Progress Notes (Signed)
LVAD Coordinator Rounding Note:  HM III LVAD implanted on 10/13/20 by Dr Cyndia Bent under Destination Therapy criteria.  Discharged from [facility] and admitted to [facility] 11/14/20.  Pt sitting up in wheelchair eating lunch. States his nausea has improved today. Reports therapy this morning "went well." He was able to walk up "5 or 10" steps and "walked about 50 ft or so" in the hall. Complaining of 6/10 sacral pain. Bedside RN bringing PRN pain medication.   Pt has 27 steps up to his apartment. Pt is working on going up and down steps in therapy, but reports this is "difficult."  His son [name] is in the process of trying to find housing that is all one story, and is within their budget.   Vital signs: Temp: 98.8 HR: 73 Doppler: 86 Auto cuff: 134/75 (91) O2 Sat: 94% on RA Wt: 183.6>190.9..........181.6>176.3>178.4>180>172.8>170.8>169.7 lbs    LVAD interrogation reveals:  Speed: 5200 Flow: 4.7 Power: 3.7w PI: 3.3 Hematocrit: 30  Alarms: none Events: none   Fixed speed: 5200 Low speed limit: 4900  Drive Line:  Existing VAD dressing clean, dry, and intact. Drive line anchor correctly applied.Continuetwice weekly dressing changesusing acell. Next dressing change will be due 12/05/20. No family available for dressing change.   Labs:  LDH trend: 199>191>196>180>174>168>182>177>287>168>163>175>168>184  INR trend: 1.5>1.7>1.9>2.4>3.2>2.9>3.2>2.9>2.2>2.1>2.3>1.9  Anticoagulation Plan: -INR Goal: 2.0-2.5 -ASA Dose: stopped 30 days post op per protocol  Device:  -  Medtronic single -  Pacing: VVI 40 - Therapies: on 200 bpm  Plan/Recommendations:  1. Call VAD coordinator for equipment or drive line issues 2. Twice weekly dressing changes while using acell. Next dressing due 12/05/20.   Emerson Monte RN [place] Coordinator  Office: [phone]  24/7 Pager: [phone]

## 2020-12-02 NOTE — Progress Notes (Signed)
Occupational Therapy Session Note  Patient Details  Name: Marcus Johnson. MRN: [id number] Date of Birth: 1960/10/10  Today's Date: 12/02/2020 OT Individual Time: 4920-1007 OT Individual Time Calculation (min): 30 min    Short Term Goals: Week 3:  OT Short Term Goal 1 (Week 3): Continue working toward established LTG  Skilled Therapeutic Interventions/Progress Updates:    Treatment session with focus on fine motor control with grip strength to simulate managing LVAD connections.  Pt received in bed in sidelying agreeable to therapy session.  Pt completed bed mobility Mod I to come to sitting EOB.  Pt engaged in peg board replication with focus on functional use of RUE, pt reports numbness in thumb and first two digits.  Incorporated stacking pegs and then pulling them apart to simulate LVAD connections.  Pt reports difficulty when pulling LVAD wires apart when changing from battery <> wall power.  Pt reports difficulty with spinning connections too as needed to disconnect cords, may benefit from use of nuts and bolts to practice the rotation/spinning aspect with managing LVAD connections.  Pt returned to supine and left with all needs in reach.  Therapy Documentation Precautions:  Precautions Precautions: Fall,Sternal,Other (comment) Precaution Comments: LVAD, sacral wound, Left BPPV Required Braces or Orthoses: Other Brace Other Brace: thumb loop and buddy strap R hand Restrictions Weight Bearing Restrictions: No RUE Weight Bearing:  (sternal precautions) Other Position/Activity Restrictions: sternal precautions General:   Vital Signs: Therapy Vitals Temp: 98.8 F (37.1 C) Temp Source: Oral Pulse Rate: 73 Resp: 16 BP: (!) 84/67 Patient Position (if appropriate): Lying Oxygen Therapy O2 Device: Room Air Pain:  Pt with no c/o pain   Therapy/Group: Individual Therapy  Simonne Come 12/02/2020, 3:12 PM

## 2020-12-02 NOTE — Progress Notes (Signed)
Patient up in [facility] for meal at 1241. Instructed nurse to consult VAST when patient is back in bed for HD drsg change. VU. Fran Lowes, RN VAST

## 2020-12-02 NOTE — Progress Notes (Addendum)
Patient ID: Marcus Johnson., male   DOB: Jun 20, 1961, 60 y.o.   MRN: [id number]     Advanced Johnson Failure Rounding Note  PCP-Cardiologist: No primary care provider on file.   Subjective:    - 2/27 Milrinone switched to dobutamine 5 mcg.  - 2/28 Moved to ICU. Dobutamine increased to 7.5 mcg and Norepi 3 mcg added. Diuretics held.  - 3/1 Impella 5.5 placed.  - 3/4 Teeth removed - 3/10 HM3 LVAD placed - 3/13 VAD speed increased to 5500. Marcus Johnson out  - 3/14 Nephrology consulted. Given 1UPRBCs  - 3/15 Started CRRT. Given 1UPRBCs.  - 3/23 VAD Speed turned down to 5300  - 3/23 CRRT and Milrinone Stopped. Back in Afib - 3/24 CRRT restarted.  - 3/27 CVVH off.  - 3/28 iHD - 3/29 iHD. Walked 75 feet!! - 3/20 Ramp Echo: Fixed speed: 5200 Low speed limit: 4900 - 4/3 S/P RIJ HD catheter.  - 4/10 60th B-day!  - 4/11 Transferred to CIR. Back on heparin drip.  -4/26 amio stopped due to tremors. -4/27 Erythromycin    INR 1.9 , LDH 184   Working with OT. Feeling ok. Denies SOB/nausea   LVAD Interrogation HM 3: Speed: 5200 Flow: 4.9  PI: 3 Power: 4  Rare PI events. Marland Kitchen VAD interrogated personally. Parameters stable.   Objective:   Weight Range: 77 kg Body mass index is 24.01 kg/m.   Vital Signs:   Temp:  [98.5 F (36.9 C)-98.8 F (37.1 C)] 98.5 F (36.9 C) (04/29 0415) Pulse Rate:  [50-101] 69 (04/29 0415) Resp:  [13-18] 17 (04/29 0415) BP: (83-155)/(46-122) 134/75 (04/29 0841) SpO2:  [92 %-100 %] 94 % (04/29 0415) Weight:  [77 kg-78.4 kg] 77 kg (04/29 0415) Last BM Date: 11/30/20  Weight change: Filed Weights   12/01/20 0944 12/01/20 1325 12/02/20 0415  Weight: 78.4 kg 77.5 kg 77 kg    Intake/Output:   Intake/Output Summary (Last 24 hours) at 12/02/2020 0850 Last data filed at 12/02/2020 0746 Gross per 24 hour  Intake 450 ml  Output 500 ml  Net -50 ml      Physical Exam    Physical Exam: GENERAL: No acute distress. Sitting on the side of the be  HEENT:  normal  NECK: Supple, JVP difficult to assess.  2+ bilaterally, no bruits.  No lymphadenopathy or thyromegaly appreciated.   CARDIAC:  Mechanical Johnson sounds with LVAD hum present.  LUNGS:  Clear to auscultation bilaterally.  ABDOMEN:  Soft, round, nontender, positive bowel sounds x4.     LVAD exit site: well-healed and incorporated.  Dressing dry and intact.  No erythema or drainage.  Stabilization device present and accurately applied.  Driveline dressing is being changed daily per sterile technique. EXTREMITIES:  Warm and dry, no cyanosis, clubbing, rash or edema . RUE PICC NEUROLOGIC:  Alert and oriented x 3.    No aphasia.  No dysarthria.  Affect pleasant.     Labs    CBC No results for input(s): WBC, NEUTROABS, HGB, HCT, MCV, PLT in the last 72 hours. Basic Metabolic Panel Recent Labs    12/01/20 0231 12/02/20 0412  NA 135 137  K 4.2 3.9  CL 96* 98  CO2 28 29  GLUCOSE 101* 97  BUN 41* 24*  CREATININE 5.59* 4.04*  CALCIUM 8.7* 9.2  MG 2.1 2.1  PHOS 4.8* 3.4   Liver Function Tests Recent Labs    12/01/20 0231 12/02/20 0412  ALBUMIN 2.6* 2.8*   No results for  input(s): LIPASE, AMYLASE in the last 72 hours. Cardiac Enzymes No results for input(s): CKTOTAL, CKMB, CKMBINDEX, TROPONINI in the last 72 hours.  BNP: BNP (last 3 results) Recent Labs    11/17/20 0411 11/24/20 0454 12/01/20 0000  BNP 630.2* 723.8* 740.4*    ProBNP (last 3 results) No results for input(s): PROBNP in the last 8760 hours.   D-Dimer No results for input(s): DDIMER in the last 72 hours. Hemoglobin A1C No results for input(s): HGBA1C in the last 72 hours. Fasting Lipid Panel No results for input(s): CHOL, HDL, LDLCALC, TRIG, CHOLHDL, LDLDIRECT in the last 72 hours. Thyroid Function Tests No results for input(s): TSH, T4TOTAL, T3FREE, THYROIDAB in the last 72 hours.  Invalid input(s): FREET3  Other results:   Imaging    No results found.   Medications:     Scheduled  Medications: . (feeding supplement) PROSource Plus  30 mL Oral BID BM  . vitamin C  1,000 mg Oral Daily  . Chlorhexidine Gluconate Cloth  6 each Topical BID  . darbepoetin (ARANESP) injection - DIALYSIS  100 mcg Intravenous Q Tue-HD  . erythromycin  250 mg Oral TID AC  . feeding supplement (NEPRO CARB STEADY)  237 mL Oral BID BM  . gabapentin  100 mg Oral QHS  . guaiFENesin  600 mg Oral BID  . insulin aspart  0-15 Units Subcutaneous TID WC  . insulin aspart  0-5 Units Subcutaneous QHS  . insulin glargine  8 Units Subcutaneous Daily  . metoCLOPramide  10 mg Oral TID AC & HS  . midodrine  10 mg Oral TID WC  . multivitamin  1 tablet Oral QHS  . Muscle Rub   Topical BID  . pantoprazole  40 mg Oral Daily  . polyethylene glycol  17 g Oral BID  . rosuvastatin  10 mg Oral Daily  . scopolamine  1 patch Transdermal Q72H  . senna-docusate  2 tablet Oral BID  . sorbitol  30 mL Oral Daily  . Warfarin - Pharmacist Dosing Inpatient   Does not apply q1600    Infusions: . sodium chloride 10 mL (11/17/20 1430)  . sodium chloride 250 mL (12/01/20 1806)    PRN Medications: sodium chloride, sodium chloride, acetaminophen, alteplase, alum & mag hydroxide-simeth, bisacodyl, dextrose, diphenhydrAMINE, guaiFENesin-dextromethorphan, heparin, lidocaine-prilocaine, ondansetron (ZOFRAN) IV, oxyCODONE, pentafluoroprop-tetrafluoroeth, prochlorperazine **OR** prochlorperazine **OR** prochlorperazine, sodium chloride flush, sodium phosphate, traZODone, white petrolatum    Assessment/Plan   1. Acute on chronic systolic HF -> cardiogenic shock:  ICM. Has Medtronic ICD. EF 15%. Low output persisted despite milrinone and then dobutamine with rise in creatinine to 2.5. Impella 5.5 placed 3/1.  Creatinine improved to 1.7 and HM3 LVAD was placed on 3/10. Developed post-op renal failure requiring CVVH. Now off milrinone and epinephrine. Last echo showed moderate RV enlargement with mildly decreased RV function and VAD  speed decreased to 5300 due to left-sided septum. CRRT stopped 3/23 but restarted 3/24. CVVH stopped again on 3/27. Ramp echo 3/30 with speed decreased to 5200. Tolerating iHD for volume management.   - Continue Midodrine  10 mg tid due to dizzinness/orthostatic.    - Continue compression stockings.   - HD for volume management - Continue midodrine 10 tid.  2. LVAD: 3/13 VAD speed increased to 5500. 3/23 speed decreased to 5300 and decreased to 5200 on 3/30. VAD interrogated personally. LDH 184   INR 1.9 today  Discussed with coumadin.  - On warfarin, on ASA 81 daily.  Discussed warfarin dosing with PharmD  personally. - DL site ok 3. CAD s/p CABG 2002:  Last cath in 6/19 with patent grafts. - Continue Crestor 40 mg daily.  4. AKI on CKD Stage 3b: Suspect that this is a combination of cardiorenal syndrome and diabetic nephropathy and possibly ATN. Developed post-op AKI. Started on CRRT 3/15. CVVH stopped 3/23 but restarted 3/24. Now getting iHD, looks like he will be HD-dependent.  Has tunneled catheter.  - HD T/Th/S - Dr Augustin Coupe to talk with VVS, but with continuous flow LVAD, think AV fistula would be less likely to mature.  Will await decision by VVS and nephrology in terms of access.  - Renal Navigator following for clip.  5. Atrial flutter/fibrillation: s/p DC-CV 08/21/18.  - 4/26 Off amio.  6. Wound R Foot:  Resolved.  7. Right arm pain: Likely neuropathic from stretch of brachial plexus with Impella placement.  - Continue gabapentin. Able to move right hand a little better.  8. ID:  WBCs normal.  9. DM: History of very poor control but hgbA1c trending down. Most recent 7.7%.  Insulin drip stopped 3/24. Blood glucose controlled on reduced Lantus and  sliding scale insulin to moderate.   - Diabetes coordinators have seen 10. Anemia: CBC tomorrow.  11. Deconditioning - Continue to work with CIR 12. Unstageable Pressure Ulcer on buttocks: Much improved 100% granulation.  - Hydrotherapy  completed - Continue repositioning 13. NSVT: Stopped amio with tremors.  14. Dizziness: Still with symptoms that seem like BPPV at times. Has had Epley maneuvers.  15. Suspected Gastroparesis. Ongoing nausea. On reglan. 4/27 Started  Erythromycin.   Amy Clegg NP-C  12/02/2020  Agree with the above NP note.   Stable today, no nausea/vomiting.  Working with PT.   Continue current regimen including Reglan and erythromycin.  He is not going to get AV graft as inpatient, wants to revisit down the road.   Loralie Champagne 12/02/2020

## 2020-12-02 NOTE — Progress Notes (Signed)
ANTICOAGULATION CONSULT NOTE - Follow Up Consult  Pharmacy Consult for Warfarin Indication: LVAD  Allergies  Allergen Reactions  . Meclizine Hcl Anaphylaxis and Swelling  . Ivabradine Nausea Only    Patient Measurements: Height: 5' 10.5" (179.1 cm) Weight: 77 kg (169 lb 12.1 oz) IBW/kg (Calculated) : 74.15 kg  Vital Signs: Temp: 98.5 F (36.9 C) (04/29 0415) Temp Source: Oral (04/29 0415) BP: 134/75 (04/29 0841) Pulse Rate: 69 (04/29 0415)  Labs: Recent Labs    11/30/20 0400 12/01/20 0231 12/02/20 0412  LABPROT 23.8* 24.9* 21.4*  INR 2.1* 2.3* 1.9*  CREATININE 4.09* 5.59* 4.04*    Estimated Creatinine Clearance: 20.4 mL/min (A) (by C-G formula based on SCr of 4.04 mg/dL (H)).   Assessment: 60 yo male s/p new LVAD implant with HeartMate III on 3/10. Warfarin started per MD on 3/12, and pharmacy has now been consulted to take over warfarin dosing.   INR slightly below goal at 1.9 today, LDH stable. The patient has been very sensitive to small warfarin dose changes. Nausea is improving with erythromycin which will affect warfarin dosing, also amiodarone stopped 4/26.  Goal of Therapy: INR 2-2.5 Monitor platelets by anticoagulation protocol: Yes   Plan:  Warfarin 3 mg tonight Daily PT/INR  Monitor for signs and symptoms of bleeding  Arrie Senate, PharmD, BCPS, [facility] Clinical Pharmacist [phone] Please check AMION for all [place] numbers 12/02/2020  [facility] pharmacy phone numbers are listed on [email or web address]

## 2020-12-02 NOTE — Progress Notes (Signed)
Physical Therapy Session Note  Patient Details  Name: Marcus Johnson. MRN: [id number] Date of Birth: 10-26-1960  Today's Date: 12/02/2020 PT Individual Time: 1030-1155 PT Individual Time Calculation (min): 85 min   Short Term Goals: Week 3:  PT Short Term Goal 1 (Week 3): STG=LTG due to ELOS.  Skilled Therapeutic Interventions/Progress Updates:   Pt received sitting in WC and agreeable to PT. Pt's son and brother present for family education. Pt transported to rehab gym. Gait training with RW 2 x 72f with supervision assist from PT and brother. Cues for safety in turns and [facility]. Brother able to guard pt with safe technique consistantly. Stair management training with CGA from PT and and brother + 4 steps + 8 steps + 4 steps to ascend/descend each set using 1 rail on the L and step to gait pattern. Cues for proper guarding technique and step to pattern to improve safety. PT educated family in use of shower chair for seated rest break with legs set to adjusted height as needed. Car transfer training x 2 with min assist provided x 2 with assist from PT and then from son. Cues for AD management and cues use of car parts of RW for UE support to prevent posterior LOB. Assist required on the LLE intermittently. Curb management training with W performed x 4 with assist from PT and then from broth with min assist for safety and AD management over step edge. PT then educated [facility] on [facility] management for home use as needed. Patient returned to room and [facility] and all needs met.         Therapy Documentation Precautions:  Precautions Precautions: Fall,Sternal,Other (comment) Precaution Comments: LVAD, sacral wound, Left BPPV Required Braces or Orthoses: Other Brace Other Brace: thumb loop and buddy strap R hand Restrictions Weight Bearing Restrictions: No RUE Weight Bearing:  (sternal  precautions) Other Position/Activity Restrictions: sternal precautions Pain: denies   Therapy/Group: Individual Therapy  ALorie Phenix4/29/2022, 6:28 PM

## 2020-12-03 LAB — PROTIME-INR
INR: 2.2 — ABNORMAL HIGH (ref 0.8–1.2)
Prothrombin Time: 24.8 seconds — ABNORMAL HIGH (ref 11.4–15.2)

## 2020-12-03 LAB — RENAL FUNCTION PANEL
Albumin: 2.8 g/dL — ABNORMAL LOW (ref 3.5–5.0)
Anion gap: 10 (ref 5–15)
BUN: 39 mg/dL — ABNORMAL HIGH (ref 6–20)
CO2: 28 mmol/L (ref 22–32)
Calcium: 9.1 mg/dL (ref 8.9–10.3)
Chloride: 99 mmol/L (ref 98–111)
Creatinine, Ser: 5.22 mg/dL — ABNORMAL HIGH (ref 0.61–1.24)
GFR, Estimated: 12 mL/min — ABNORMAL LOW (ref >60)
Glucose, Bld: 118 mg/dL — ABNORMAL HIGH (ref 70–99)
Phosphorus: 3.3 mg/dL (ref 2.5–4.6)
Potassium: 4.4 mmol/L (ref 3.5–5.1)
Sodium: 137 mmol/L (ref 135–145)

## 2020-12-03 LAB — GLUCOSE, CAPILLARY
Glucose-Capillary: 114 mg/dL — ABNORMAL HIGH (ref 70–99)
Glucose-Capillary: 112 mg/dL — ABNORMAL HIGH (ref 70–99)
Glucose-Capillary: 135 mg/dL — ABNORMAL HIGH (ref 70–99)
Glucose-Capillary: 140 mg/dL — ABNORMAL HIGH (ref 70–99)

## 2020-12-03 LAB — HEPATITIS B SURFACE ANTIGEN: Hepatitis B Surface Ag: NONREACTIVE

## 2020-12-03 LAB — CBC
HCT: 31.2 % — ABNORMAL LOW (ref 39.0–52.0)
Hemoglobin: 9.2 g/dL — ABNORMAL LOW (ref 13.0–17.0)
MCH: 26.4 pg (ref 26.0–34.0)
MCHC: 29.5 g/dL — ABNORMAL LOW (ref 30.0–36.0)
MCV: 89.4 fL (ref 80.0–100.0)
Platelets: 208 10*3/uL (ref 150–400)
RBC: 3.49 MIL/uL — ABNORMAL LOW (ref 4.22–5.81)
RDW: 16.8 % — ABNORMAL HIGH (ref 11.5–15.5)
WBC: 8.6 10*3/uL (ref 4.0–10.5)
nRBC: 0 % (ref 0.0–0.2)

## 2020-12-03 LAB — LACTATE DEHYDROGENASE: LDH: 179 U/L (ref 98–192)

## 2020-12-03 LAB — MAGNESIUM: Magnesium: 2.1 mg/dL (ref 1.7–2.4)

## 2020-12-03 MED ORDER — MIDODRINE HCL 5 MG PO TABS
ORAL_TABLET | ORAL | Status: AC
  Administered 2020-12-03: 10 mg via ORAL
  Filled 2020-12-03: qty 2

## 2020-12-03 MED ORDER — ALTEPLASE 2 MG IJ SOLR: 2.0000 mg | Freq: Once | INTRAMUSCULAR | Status: DC | PRN

## 2020-12-03 MED ORDER — HEPARIN SODIUM (PORCINE) 1000 UNIT/ML DIALYSIS: 1000.0000 [IU] | INTRAMUSCULAR | Status: DC | PRN

## 2020-12-03 MED ORDER — LIDOCAINE-PRILOCAINE 2.5-2.5 % EX CREA: 1.0000 "application " | TOPICAL_CREAM | CUTANEOUS | Status: DC | PRN

## 2020-12-03 MED ORDER — PENTAFLUOROPROP-TETRAFLUOROETH EX AERO: 1.0000 "application " | INHALATION_SPRAY | CUTANEOUS | Status: DC | PRN

## 2020-12-03 MED ORDER — SODIUM CHLORIDE 0.9 % IV SOLN: 100.0000 mL | INTRAVENOUS | Status: DC | PRN

## 2020-12-03 MED ORDER — LIDOCAINE HCL (PF) 1 % IJ SOLN: 5.0000 mL | INTRAMUSCULAR | Status: DC | PRN

## 2020-12-03 MED ORDER — HEPARIN SODIUM (PORCINE) 1000 UNIT/ML IJ SOLN
INTRAMUSCULAR | Status: AC
  Administered 2020-12-03: 3800 [IU] via INTRAVENOUS_CENTRAL
  Filled 2020-12-03: qty 4

## 2020-12-03 NOTE — Progress Notes (Signed)
Patient ID: Marcus Johnson., male   DOB: 03-Sep-1960, 60 y.o.   MRN: [id number]     Advanced Heart Failure Rounding Note  PCP-Cardiologist: No primary care provider on file.   Subjective:    - 2/27 Milrinone switched to dobutamine 5 mcg.  - 2/28 Moved to ICU. Dobutamine increased to 7.5 mcg and Norepi 3 mcg added. Diuretics held.  - 3/1 Impella 5.5 placed.  - 3/4 Teeth removed - 3/10 HM3 LVAD placed - 3/13 VAD speed increased to 5500. [name] out  - 3/14 Nephrology consulted. Given 1UPRBCs  - 3/15 Started CRRT. Given 1UPRBCs.  - 3/23 VAD Speed turned down to 5300  - 3/23 CRRT and Milrinone Stopped. Back in Afib - 3/24 CRRT restarted.  - 3/27 CVVH off.  - 3/28 iHD - 3/29 iHD. Walked 75 feet!! - 3/20 Ramp Echo: Fixed speed: 5200 Low speed limit: 4900 - 4/3 S/P RIJ HD catheter.  - 4/10 60th B-day!  - 4/11 Transferred to CIR. Back on heparin drip.  - 4/26 amio stopped due to tremors. - 4/27 Erythromycin    INR 2.2, LDH 179   Doing better with PT, nausea resolved, minimal dyspnea.  Tolerating HD.  LVAD Interrogation HM 3: Speed: 5200 Flow: 4.3  PI: 4.4 Power: 3.8  Rare PI events.  VAD interrogated personally. Parameters stable.   Objective:   Weight Range: 77 kg Body mass index is 24.01 kg/m.   Vital Signs:   Temp:  [97.9 F (36.6 C)-98.8 F (37.1 C)] 98.7 F (37.1 C) (04/30 0720) Pulse Rate:  [67-95] 89 (04/30 0830) Resp:  [16-18] 18 (04/30 0720) BP: (84-122)/(63-107) 112/73 (04/30 0830) SpO2:  [91 %-99 %] 94 % (04/30 0720) Weight:  [77 kg] 77 kg (04/30 0720) Last BM Date: 11/30/20  Weight change: Filed Weights   12/02/20 0415 12/03/20 0427 12/03/20 0720  Weight: 77 kg 77 kg 77 kg    Intake/Output:   Intake/Output Summary (Last 24 hours) at 12/03/2020 0908 Last data filed at 12/03/2020 0630 Gross per 24 hour  Intake 480 ml  Output 200 ml  Net 280 ml      Physical Exam   General: Well appearing this am. NAD.  HEENT: Normal. Neck: Supple, JVP  7-8 cm. Carotids OK.  Cardiac:  Mechanical heart sounds with LVAD hum present.  Lungs:  CTAB, normal effort.  Abdomen:  NT, ND, no HSM. No bruits or masses. +BS  LVAD exit site: Well-healed and incorporated. Dressing dry and intact. No erythema or drainage. Stabilization device present and accurately applied. Driveline dressing changed daily per sterile technique. Extremities:  Warm and dry. No cyanosis, clubbing, rash, or edema.  Neuro:  Alert & oriented x 3. Cranial nerves grossly intact. Moves all 4 extremities w/o difficulty. Affect pleasant     Labs    CBC Recent Labs    12/03/20 0317  WBC 8.6  HGB 9.2*  HCT 31.2*  MCV 89.4  PLT 253   Basic Metabolic Panel Recent Labs    12/02/20 0412 12/03/20 0317  NA 137 137  K 3.9 4.4  CL 98 99  CO2 29 28  GLUCOSE 97 118*  BUN 24* 39*  CREATININE 4.04* 5.22*  CALCIUM 9.2 9.1  MG 2.1 2.1  PHOS 3.4 3.3   Liver Function Tests Recent Labs    12/02/20 0412 12/03/20 0317  ALBUMIN 2.8* 2.8*   No results for input(s): LIPASE, AMYLASE in the last 72 hours. Cardiac Enzymes No results for input(s): CKTOTAL, CKMB, CKMBINDEX,  TROPONINI in the last 72 hours.  BNP: BNP (last 3 results) Recent Labs    11/17/20 0411 11/24/20 0454 12/01/20 0000  BNP 630.2* 723.8* 740.4*    ProBNP (last 3 results) No results for input(s): PROBNP in the last 8760 hours.   D-Dimer No results for input(s): DDIMER in the last 72 hours. Hemoglobin A1C No results for input(s): HGBA1C in the last 72 hours. Fasting Lipid Panel No results for input(s): CHOL, HDL, LDLCALC, TRIG, CHOLHDL, LDLDIRECT in the last 72 hours. Thyroid Function Tests No results for input(s): TSH, T4TOTAL, T3FREE, THYROIDAB in the last 72 hours.  Invalid input(s): FREET3  Other results:   Imaging    No results found.   Medications:     Scheduled Medications: . (feeding supplement) PROSource Plus  30 mL Oral BID BM  . vitamin C  1,000 mg Oral Daily  .  Chlorhexidine Gluconate Cloth  6 each Topical BID  . Chlorhexidine Gluconate Cloth  6 each Topical Q0600  . darbepoetin (ARANESP) injection - DIALYSIS  100 mcg Intravenous Q Tue-HD  . erythromycin  250 mg Oral TID AC  . feeding supplement (NEPRO CARB STEADY)  237 mL Oral BID BM  . gabapentin  100 mg Oral QHS  . insulin aspart  0-15 Units Subcutaneous TID WC  . insulin aspart  0-5 Units Subcutaneous QHS  . insulin glargine  8 Units Subcutaneous Daily  . metoCLOPramide  10 mg Oral TID AC & HS  . midodrine  10 mg Oral TID WC  . multivitamin  1 tablet Oral QHS  . Muscle Rub   Topical BID  . pantoprazole  40 mg Oral Daily  . polyethylene glycol  17 g Oral BID  . rosuvastatin  10 mg Oral Daily  . scopolamine  1 patch Transdermal Q72H  . senna-docusate  2 tablet Oral BID  . sorbitol  30 mL Oral Daily  . Warfarin - Pharmacist Dosing Inpatient   Does not apply q1600    Infusions: . sodium chloride 10 mL (11/17/20 1430)  . sodium chloride 250 mL (12/01/20 1806)    PRN Medications: sodium chloride, sodium chloride, acetaminophen, alteplase, alum & mag hydroxide-simeth, bisacodyl, dextrose, diphenhydrAMINE, guaiFENesin-dextromethorphan, heparin, lidocaine (PF), lidocaine-prilocaine, ondansetron (ZOFRAN) IV, oxyCODONE, pentafluoroprop-tetrafluoroeth, prochlorperazine **OR** prochlorperazine **OR** prochlorperazine, sodium chloride flush, sodium phosphate, traZODone, white petrolatum    Assessment/Plan   1. Acute on chronic systolic HF -> cardiogenic shock:  ICM. Has Medtronic ICD. EF 15%. Low output persisted despite milrinone and then dobutamine with rise in creatinine to 2.5. Impella 5.5 placed 3/1.  Creatinine improved to 1.7 and HM3 LVAD was placed on 3/10. Developed post-op renal failure requiring CVVH. Now off milrinone and epinephrine. Last echo showed moderate RV enlargement with mildly decreased RV function and VAD speed decreased to 5300 due to left-sided septum. CRRT stopped 3/23 but  restarted 3/24. CVVH stopped again on 3/27. Ramp echo 3/30 with speed decreased to 5200. Tolerating iHD for volume management.   - Continue Midodrine 10 mg tid due to dizzinness/orthostasis (resolved for the most part).    - Continue compression stockings.   - HD for volume management 2. LVAD: 3/13 VAD speed increased to 5500. 3/23 speed decreased to 5300 and decreased to 5200 on 3/30. VAD interrogated personally. LDH 179, INR 2.2 today  - On warfarin, on ASA 81 daily.  - DL site ok 3. CAD s/p CABG 2002:  Last cath in 6/19 with patent grafts. - Continue Crestor 40 mg daily.  4. AKI  on CKD Stage 3b: Suspect that this is a combination of cardiorenal syndrome and diabetic nephropathy and possibly ATN. Developed post-op AKI. Started on CRRT 3/15. CVVH stopped 3/23 but restarted 3/24. Now getting iHD, looks like he will be HD-dependent.  Has tunneled catheter.  - HD T/Th/S - Renal and VVS discussing, but with continuous flow LVAD, think AV fistula would be less likely to mature.  Patient wants to wait on getting AV graft until some point after discharge.  - Renal Navigator following for clip.  5. Atrial flutter/fibrillation: s/p DC-CV 08/21/18.  - 4/26 Off amio.  6. Wound R Foot:  Resolved.  7. Right arm pain: Likely neuropathic from stretch of brachial plexus with Impella placement.  - Continue gabapentin. Able to move right hand a little better.  8. ID:  WBCs normal.  9. DM: History of very poor control but hgbA1c trending down. Most recent 7.7%.  Insulin drip stopped 3/24. Blood glucose controlled on reduced Lantus and  sliding scale insulin to moderate.   - Diabetes coordinators have seen 10. Anemia: Stable CBC.  11. Deconditioning - Continue to work with CIR 12. Unstageable Pressure Ulcer on buttocks: Much improved 100% granulation.  - Hydrotherapy completed - Continue repositioning 13. NSVT: Stopped amio with tremors.  14. Dizziness: Still with symptoms that seem like BPPV at times. Has  had Epley maneuvers.  15. Suspected Gastroparesis. Nausea resolved on Reglan and erythromycin.   Doing better, hopefully home soon when outpatient HD has been worked out.   Loralie Champagne 12/03/2020

## 2020-12-03 NOTE — Progress Notes (Signed)
Patient transported off the unit to dialysis appointment at [facility].

## 2020-12-03 NOTE — Progress Notes (Signed)
Physical Therapy Session Note  Patient Details  Name: Marcus Johnson. MRN: [id number] Date of Birth: [birth year]  Today's Date: 12/03/2020 PT Individual Time: 1635-1730 PT Individual Time Calculation (min): 55 min   Short Term Goals: Week 3:  PT Short Term Goal 1 (Week 3): STG=LTG due to ELOS.  Skilled Therapeutic Interventions/Progress Updates:   Pt received supine in bed and agreeable to PT. PT instructed pt in bed level therex: SAQ, SLR, heel slides, hip abduction, hip flexion, ankle press, hip adduction, quad sets. Pt performed x 12 BLE with intermittent use of level 2 tband. Pt performed UE therex holding therapy ball chest press and trunkal rotation within available range, as well as AROM shoulder flexion. Cues for full ROM within pain free range as well as decreased speed of eccentric movement and increased hold at end rage.  Supine>sit transfer without assist. Seated therex, calf raise, LAQ, ankle DF. Each completed x 10 with cues for proper speed as listed above. PT found pill on floor in pt's room. RN notified. Patient left sitting  On EOB with call bell in reach and all needs met.         Therapy Documentation Precautions:  Precautions Precautions: Fall,Sternal,Other (comment) Precaution Comments: LVAD, sacral wound, Left BPPV Required Braces or Orthoses: Other Brace Other Brace: thumb loop and buddy strap R hand Restrictions Weight Bearing Restrictions: No RUE Weight Bearing:  (sternal precautions) Other Position/Activity Restrictions: sternal precautions   Pain: Pain Assessment Pain Score: 3     Therapy/Group: Individual Therapy  Lorie Phenix 12/03/2020, 5:41 PM

## 2020-12-03 NOTE — Progress Notes (Signed)
ANTICOAGULATION CONSULT NOTE - Follow Up Consult  Pharmacy Consult for Warfarin Indication: LVAD  Allergies  Allergen Reactions  . Meclizine Hcl Anaphylaxis and Swelling  . Ivabradine Nausea Only    Patient Measurements: Height: 5' 10.5" (179.1 cm) Weight: 77 kg (169 lb 12.1 oz) IBW/kg (Calculated) : 74.15 kg  Vital Signs: Temp: 98 F (36.7 C) (04/30 0427) BP: 122/107 (04/30 0427) Pulse Rate: 85 (04/30 0427)  Labs: Recent Labs    12/01/20 0231 12/02/20 0412 12/03/20 0317  HGB  --   --  9.2*  HCT  --   --  31.2*  PLT  --   --  208  LABPROT 24.9* 21.4* 24.8*  INR 2.3* 1.9* 2.2*  CREATININE 5.59* 4.04* 5.22*    Estimated Creatinine Clearance: 15.8 mL/min (A) (by C-G formula based on SCr of 5.22 mg/dL (H)).   Assessment: 60 yo male s/p new LVAD implant with HeartMate III on 3/10. Warfarin started per MD on 3/12, and pharmacy has now been consulted to take over warfarin dosing.   INR back within range this morning at 2.2, LDH stable. The patient has been very sensitive to small warfarin dose changes. Nausea is improving with erythromycin which will affect warfarin dosing, also amiodarone stopped 4/26.  Goal of Therapy: INR 2-2.5 Monitor platelets by anticoagulation protocol: Yes   Plan:  Warfarin 3 mg again tonight Daily PT/INR  Monitor for signs and symptoms of bleeding  Erin Hearing PharmD., BCPS Clinical Pharmacist 12/03/2020 7:22 AM   [facility] pharmacy phone numbers are listed on [email or web address]

## 2020-12-03 NOTE — Progress Notes (Signed)
   12/03/20 1100  Vitals  Temp 98.6 F (37 C)  Temp Source Oral  BP (!) 111/53  BP Location Left Arm  BP Method Automatic  Patient Position (if appropriate) Lying  Pulse Rate 91  Pulse Rate Source Monitor  Resp 16  Oxygen Therapy  SpO2 100 %  O2 Device Room Air  Pain Assessment  Pain Scale 0-10  Pain Score 1  Post-Hemodialysis Assessment  Rinseback Volume (mL) 250 mL  KECN 264 V  Dialyzer Clearance Lightly streaked  Duration of HD Treatment -hour(s) 3.5 hour(s)  Hemodialysis Intake (mL) 500 mL  UF Total -Machine (mL) 1963 mL  Net UF (mL) 1463 mL  Tolerated HD Treatment Yes  Post-Hemodialysis Comments tx complete-pt stable  Hemodialysis Catheter Right Internal jugular Double lumen Permanent (Tunneled)  Placement Date/Time: 11/07/20 1612   Time Out: Correct patient;Correct site;Correct procedure  Maximum sterile barrier precautions: Cap;Mask;Sterile gown;Sterile gloves;Large sterile sheet  Site Prep: Chlorhexidine (preferred);Skin Prep Completely Dry...  Site Condition No complications  Blue Lumen Status Flushed;Heparin locked;Capped (Central line)  Red Lumen Status Flushed;Capped (Central line);Heparin locked  Catheter fill solution Heparin 1000 units/ml  Catheter fill volume (Arterial) 1.9 cc  Catheter fill volume (Venous) 1.9  Dressing Type Occlusive  Dressing Status Clean;Dry;Intact  Antimicrobial disc in place? Yes  Drainage Description None  Dressing Change Due 12/09/20  Post treatment catheter status Capped and Clamped

## 2020-12-03 NOTE — Procedures (Signed)
Patient was seen on dialysis and the procedure was supervised.  BFR 400  Via TDC BP is  112/73. HD in chair/recliner.   Patient appears to be tolerating treatment well. No new event.   Malaika Arnall Tanna Furry 12/03/2020

## 2020-12-04 LAB — GLUCOSE, CAPILLARY
Glucose-Capillary: 82 mg/dL (ref 70–99)
Glucose-Capillary: 150 mg/dL — ABNORMAL HIGH (ref 70–99)
Glucose-Capillary: 120 mg/dL — ABNORMAL HIGH (ref 70–99)
Glucose-Capillary: 174 mg/dL — ABNORMAL HIGH (ref 70–99)

## 2020-12-04 LAB — PROTIME-INR
INR: 1.8 — ABNORMAL HIGH (ref 0.8–1.2)
Prothrombin Time: 21.3 seconds — ABNORMAL HIGH (ref 11.4–15.2)

## 2020-12-04 LAB — RENAL FUNCTION PANEL
Albumin: 2.7 g/dL — ABNORMAL LOW (ref 3.5–5.0)
Anion gap: 9 (ref 5–15)
BUN: 25 mg/dL — ABNORMAL HIGH (ref 6–20)
CO2: 28 mmol/L (ref 22–32)
Calcium: 9.2 mg/dL (ref 8.9–10.3)
Chloride: 98 mmol/L (ref 98–111)
Creatinine, Ser: 3.89 mg/dL — ABNORMAL HIGH (ref 0.61–1.24)
GFR, Estimated: 17 mL/min — ABNORMAL LOW (ref >60)
Glucose, Bld: 92 mg/dL (ref 70–99)
Phosphorus: 3.2 mg/dL (ref 2.5–4.6)
Potassium: 4.3 mmol/L (ref 3.5–5.1)
Sodium: 135 mmol/L (ref 135–145)

## 2020-12-04 LAB — LACTATE DEHYDROGENASE: LDH: 172 U/L (ref 98–192)

## 2020-12-04 LAB — MAGNESIUM: Magnesium: 2 mg/dL (ref 1.7–2.4)

## 2020-12-04 MED ORDER — WARFARIN SODIUM 5 MG PO TABS
5.0000 mg | ORAL_TABLET | Freq: Once | ORAL | Status: AC
  Administered 2020-12-04: 5 mg via ORAL
  Filled 2020-12-04: qty 1

## 2020-12-04 NOTE — Progress Notes (Signed)
PROGRESS NOTE   Subjective/Complaints: Patient's chart reviewed- No issues reported overnight Vitals signs stable except for hypotension- on midodrine Notes weakness in his right hand, stable Sitting up at side of bed about to work with Cherie  ROS: Patient denies fever, rash, sore throat, blurred vision, nausea, vomiting, diarrhea, cough,   chest pain, joint or back pain, headache, or mood change.     Objective:   No results found. Recent Labs    12/03/20 0317  WBC 8.6  HGB 9.2*  HCT 31.2*  PLT 208   Recent Labs    12/03/20 0317 12/04/20 0532  NA 137 135  K 4.4 4.3  CL 99 98  CO2 28 28  GLUCOSE 118* 92  BUN 39* 25*  CREATININE 5.22* 3.89*  CALCIUM 9.1 9.2    Intake/Output Summary (Last 24 hours) at 12/04/2020 0852 Last data filed at 12/04/2020 0759 Gross per 24 hour  Intake 480 ml  Output 1463 ml  Net -983 ml     Pressure Injury 10/16/20 Coccyx Mid Stage 3 -  Full thickness tissue loss. Subcutaneous fat may be visible but bone, tendon or muscle are NOT exposed. Blister that has drained, 2 cm X 4.5 cm (Active)  10/16/20 0800  Location: Coccyx  Location Orientation: Mid  Staging: Stage 3 -  Full thickness tissue loss. Subcutaneous fat may be visible but bone, tendon or muscle are NOT exposed. (as per WOC note on 11/14/20)  Wound Description (Comments): Blister that has drained, 2 cm X 4.5 cm  Present on Admission: No     Pressure Injury 10/16/20 Coccyx Right Stage 2 -  Partial thickness loss of dermis presenting as a shallow open injury with a red, pink wound bed without slough. blister that has drained, 0.5 cm X 0.5 cm (Active)  10/16/20 0800  Location: Coccyx  Location Orientation: Right  Staging: Stage 2 -  Partial thickness loss of dermis presenting as a shallow open injury with a red, pink wound bed without slough.  Wound Description (Comments): blister that has drained, 0.5 cm X 0.5 cm  Present on  Admission: No    Physical Exam: Vital Signs Blood pressure (!) 85/70, pulse 87, temperature 98.6 F (37 C), temperature source Oral, resp. rate 19, height 5' 10.5" (1.791 m), weight 82.9 kg, SpO2 96 %. Gen: no distress, normal appearing HEENT: oral mucosa pink and moist, NCAT Cardio: Reg rate Chest: normal effort, normal rate of breathing Abd: soft, non-distended Ext: no edema Psych: pleasant, normal affect Skin: tunneled cath right shoulder in place.LVAD site dry, sacral wound very superficial, pink granulation tissue Neuro: Alert and oriented x 3. Normal insight and awareness. STM deficits. Normal language and speech. Cranial nerve exam unremarkable  Sensory exam is normal. Reflexes are 2+ in all 4's. Fine motor coordination is intact. R>L UE tremors mild today. Motor function is grossly 4/5 with tremor. RUE  Remains 3-4/5 but still limited d/t pain in shoulder. has scattered sensory loss throughout right arm. LE motor 3+ to 4/5 Musculoskeletal:  RUE moving more freely   Assessment/Plan: 1. Functional deficits which require 3+ hours per day of interdisciplinary therapy in a comprehensive inpatient rehab setting.  Physiatrist  is providing close team supervision and 24 hour management of active medical problems listed below.  Physiatrist and rehab team continue to assess barriers to discharge/monitor patient progress toward functional and medical goals  Care Tool:  Bathing  Bathing activity did not occur:  (time constraints) Body parts bathed by patient: Left arm,Abdomen,Chest,Front perineal area,Right upper leg,Left upper leg,Face,Right arm,Buttocks   Body parts bathed by helper: Right lower leg,Left lower leg     Bathing assist Assist Level: Moderate Assistance - Patient 50 - 74%     Upper Body Dressing/Undressing Upper body dressing Upper body dressing/undressing activity did not occur (including orthotics): Environmental limitations (PICC running) What is the patient  wearing?: Pull over shirt    Upper body assist Assist Level: Minimal Assistance - Patient > 75%    Lower Body Dressing/Undressing Lower body dressing      What is the patient wearing?: Pants     Lower body assist Assist for lower body dressing: Moderate Assistance - Patient 50 - 74%     Toileting Toileting Toileting Activity did not occur Landscape architect and hygiene only): N/A (no void or bm)  Toileting assist Assist for toileting: Dependent - Patient 0%     Transfers Chair/bed transfer  Transfers assist  Chair/bed transfer activity did not occur: Safety/medical concerns  Chair/bed transfer assist level: Contact Guard/Touching assist Chair/bed transfer assistive device: Programmer, multimedia   Ambulation assist   Ambulation activity did not occur: Safety/medical concerns  Assist level: Contact Guard/Touching assist Assistive device: Walker-rolling Max distance: 205 ft   Walk 10 feet activity   Assist  Walk 10 feet activity did not occur: Safety/medical concerns  Assist level: Contact Guard/Touching assist Assistive device: Walker-rolling   Walk 50 feet activity   Assist Walk 50 feet with 2 turns activity did not occur: Safety/medical concerns  Assist level: Contact Guard/Touching assist Assistive device: Walker-rolling    Walk 150 feet activity   Assist Walk 150 feet activity did not occur: Safety/medical concerns  Assist level: Contact Guard/Touching assist Assistive device: Walker-rolling    Walk 10 feet on uneven surface  activity   Assist Walk 10 feet on uneven surfaces activity did not occur: Safety/medical concerns         Wheelchair     Assist Will patient use wheelchair at discharge?: No (Per PT long term goals)   Wheelchair activity did not occur: Safety/medical concerns         Wheelchair 50 feet with 2 turns activity    Assist    Wheelchair 50 feet with 2 turns activity did not occur:  Safety/medical concerns       Wheelchair 150 feet activity     Assist  Wheelchair 150 feet activity did not occur: Safety/medical concerns       Blood pressure (!) 85/70, pulse 87, temperature 98.6 F (37 C), temperature source Oral, resp. rate 19, height 5' 10.5" (1.791 m), weight 82.9 kg, SpO2 96 %.  Medical Problem List and Plan: 1.Functional and mobility deficitssecondary to debility after acute on chronic CHF with subsequent LVAD placement -patient maynotshower -ELOS/Goals: 5/2  -Continue CIR therapies including PT, OT    -pt with STM deficits  - scopolamine patch for ?vestibular symptoms  -change HD to AM schedule so that we can work on stairs afterwards   -take midodrine immediately after HD 2. Chronic systolic CHF s/p LVAD 3:  -DVT/anticoagulation:Pharmaceutical:Continue Coumadin per pharmacy -antiplatelet therapy: ASA 3.Right arm neuropathic pain/brachial plexus injury/Pain Management:Tylenol or oxycodone prn.  --  gabapentin 200 mgat bedtime with some benefit -sling prn for RUE support and comfort  -expect recovery of motor/sensory function in RUE  5/1 well controlled, continue current regimen.  4. Mood:LCSW to follow for evaluation and support. -antipsychotic agents: N/A 5. Neuropsych: This patientiscapable of making decisions on hisown behalf. 6. Skin/Wound Care:Optimize nutritional status--continue Nephro and prosource.  --Sacral decubitus: Air mattress.    -appears much improved-->change to hydrogel/gauze daily   -pt is NO LONGER RECEIVING HYDRO --Right foot wound resolved.  7. Fluids/Electrolytes/Nutrition:Strict I/O. Continue to monitor labs with HD? 8. Chronic systolic CHF s/p LVAD 3:  --Cardiology to follow and manage cardiac issues.  -5/1 large increase in weight Filed Weights   12/03/20 0427 12/03/20 0720  12/04/20 0622  Weight: 77 kg 77 kg 82.9 kg    bp/volume mgt per nephro and cards.    -off revatio   -TEDS, acclimation   - continue midodrine   10mg  tid (give dose immediately after HD) 9. Acute on chronic anemia: Continue to monitor H/H/ monitor for signs of bleeding.  --Was transfused with multiple units PRBC. --ON Nulecit Tu/Th/Sa  -transfuse as needed in HD hgb holding at 8.6 10. T2DM: Hgb A1c- monitor BS ac/hs.BS reasonable on current intake --continue Lantus in am with SSI for elevated BS.   4/23-5/1   improved after lantus decreased to 8u qam   -may need to readjust as he's eating more CBG (last 3)  Recent Labs    12/03/20 1646 12/03/20 2054 12/04/20 0603  GLUCAP 135* 140* 82    11. ESRD: Hyperkalemia being managed with lokelma/HD. -see above 12. Mild leucocytosis: Resolved  13. A fib: NSR, HR remains controlled on Amiodarone, continue 14. Nausea: likely d/t to peristalsis/gastroparesis---improved  -keep bowels moving  - continue reglan 10mg  AC/HS   -amiodarone reduced per cards        LOS: 20 days A FACE TO [place] 12/04/2020, 8:52 AM

## 2020-12-04 NOTE — Progress Notes (Signed)
[facility] NEPHROLOGY PROGRESS NOTE  Assessment/ Plan: Pt is a 60 y.o. yo male withsystolic CHF with EF of 71%, has ICD, LVAD AKI required  CRRT initially now on intermittent hemodialysis.    #Acute kidney injury on CKD due to cardiorenal syndrome. Initially required CRRT and then transition to intermittent hemodialysis since 3/28.  No sign of renal recovery so far and tolerating dialysis.  Status post TDC placed by IR on 11/07/2020. Renal navigator is following for outpatient HD center arrangement, currently doing inpatient rehab. Status post HD on 4/30 with 1.4 L UF, tolerated well.  Plan for next HD on 5/3.   # Vascular access: vein mapping was done on 4/1 and was seen by [facility]VS. At that time the plan was follow outpatient for permanent access. He is now on coumadin and LVAD. Discussed with Dr. Marigene Ehlers, AVF probably won't mature therefore he will need AVG. I have discussed perm access with the patient and his son over the phone. He has numbness in one hand and worries about the complication of surgery, failure, losing another hand etc. He does not want  perm access at this time.     #Acute on chronic systolic heart failure with cardiogenic shock: Has ICD and LVAD. Volume management by dialysis. Cardiology is following. Also on midodrine.    #Anemia of CKD and critical illness: Hemoglobin is stable. Continue Aranesp.    #CKD-MBD: Calcium and phosphorus level acceptable. Monitor lab.   Subjective: Seen and examined.  He is working with therapy today.  Denies any nausea, vomiting, chest pain, shortness of breath.  No new event.  Had dialysis yesterday. Objective Vital signs in last 24 hours: Vitals:   12/04/20 0004 12/04/20 0409 12/04/20 0622 12/04/20 0806  BP: (!) 111/98 109/87  (!) 85/70  Pulse: 97 87    Resp: 19 19    Temp: 98.8 F (37.1 C) 98.6 F (37 C)    TempSrc: Oral Oral    SpO2: 98% 96%    Weight:   82.9 kg   Height:        Weight change: 0 kg  Intake/Output Summary (Last 24 hours) at 12/04/2020 1035 Last data filed at 12/04/2020 0759 Gross per 24 hour  Intake 480 ml  Output 1463 ml  Net -983 ml       Labs: Basic Metabolic Panel: Recent Labs  Lab 12/02/20 0412 12/03/20 0317 12/04/20 0532  NA 137 137 135  K 3.9 4.4 4.3  CL 98 99 98  CO2 29 28 28   GLUCOSE 97 118* 92  BUN 24* 39* 25*  CREATININE 4.04* 5.22* 3.89*  CALCIUM 9.2 9.1 9.2  PHOS 3.4 3.3 3.2   Liver Function Tests: Recent Labs  Lab 12/02/20 0412 12/03/20 0317 12/04/20 0532  ALBUMIN 2.8* 2.8* 2.7*   No results for input(s): LIPASE, AMYLASE in the last 168 hours. Recent Labs  Lab 11/30/20 0423  AMMONIA 20   CBC: Recent Labs  Lab 11/29/20 0413 12/03/20 0317  WBC 9.7 8.6  HGB 9.1* 9.2*  HCT 30.3* 31.2*  MCV 88.3 89.4  PLT 198 208   Cardiac Enzymes: No results for input(s): CKTOTAL, CKMB, CKMBINDEX, TROPONINI in the last 168 hours. CBG: Recent Labs  Lab 12/03/20 0606 12/03/20 1146 12/03/20 1646 12/03/20 2054 12/04/20 0603  GLUCAP 114* 112* 135* 140* 82    Iron Studies: No results for input(s): IRON, TIBC, TRANSFERRIN, FERRITIN in the last 72 hours. Studies/Results: No results found.  Medications: Infusions: . sodium chloride 10 mL (  11/17/20 1430)  . sodium chloride 250 mL (12/01/20 1806)    Scheduled Medications: . (feeding supplement) PROSource Plus  30 mL Oral BID BM  . vitamin C  1,000 mg Oral Daily  . Chlorhexidine Gluconate Cloth  6 each Topical BID  . Chlorhexidine Gluconate Cloth  6 each Topical Q0600  . darbepoetin (ARANESP) injection - DIALYSIS  100 mcg Intravenous Q Tue-HD  . erythromycin  250 mg Oral TID AC  . feeding supplement (NEPRO CARB STEADY)  237 mL Oral BID BM  . gabapentin  100 mg Oral QHS  . insulin aspart  0-15 Units Subcutaneous TID WC  . insulin aspart  0-5 Units Subcutaneous QHS  . insulin glargine  8 Units Subcutaneous Daily  . metoCLOPramide  10 mg Oral TID AC & HS  .  midodrine  10 mg Oral TID WC  . multivitamin  1 tablet Oral QHS  . Muscle Rub   Topical BID  . pantoprazole  40 mg Oral Daily  . polyethylene glycol  17 g Oral BID  . rosuvastatin  10 mg Oral Daily  . scopolamine  1 patch Transdermal Q72H  . senna-docusate  2 tablet Oral BID  . sorbitol  30 mL Oral Daily  . warfarin  5 mg Oral ONCE-1600  . Warfarin - Pharmacist Dosing Inpatient   Does not apply q1600    have reviewed scheduled and prn medications.  Physical Exam: General:NAD, comfortable Heart: Mechanical heart sound Lungs:clear b/l, no crackle Abdomen:soft, Non-tender, non-distended Extremities:No edema Dialysis Access: Right IJ TDC in place  Kiauna Zywicki Prasad Damione Robideau 12/04/2020,10:35 AM  LOS: 20 days

## 2020-12-04 NOTE — Progress Notes (Signed)
Physical Therapy Session Note  Patient Details  Name: Marcus Johnson. MRN: [id number] Date of Birth: 09-Aug-1960  Today's Date: 12/04/2020 PT Individual Time: 0915-1015 PT Individual Time Calculation (min): 60 min   Short Term Goals: Week 3:  PT Short Term Goal 1 (Week 3): STG=LTG due to ELOS.  Skilled Therapeutic Interventions/Progress Updates:     Patient in bed asleep upon PT arrival. Patient easily aroused and agreeable to PT session. Patient reported 2/10 sacral pain during session, RN made aware. PT provided repositioning, rest breaks, and distraction as pain interventions throughout session.   MAP 90 with Doppler in sitting at beginning of session. Patient asymptomatic throughout session.   Therapeutic Activity: Bed Mobility: Patient performed supine to sit with supervision with use of bed rail. Provided verbal cues for omitting use of the bed rail to simulate home set-up, patient in agreement. Switched patient from monitor to battery power for LVAD with total A due to upper extremity coordination deficits. Educated patient on battery vest set-up and readjusted due to significant twisting occurring. Patient sat EOB with mod I throughout. Transfers: Patient performed sit to/from stand x1 and stand pivot x1 with supervision using RW. Provided verbal cues for forward weight shift. Patient was continent of bladder in standing with use of urinal with mod A for placement and peri-care. Maintained standing >2 min with supervision using RW.  Gait Training:  Patient ambulated ~200 feet using RW with CGA-close supervision and w/c follow for safety due to decreased activity tolerance. Required 1 standing rest break due to lower extremity shaking/buckling without LOB. Ambulated with decreased gait speed, decreased step length and height, and decreased trunk rotation and visual scanning. Provided verbal cues for paced breathing, increased step length, and visual scanning for improved  environmental awareness.  Neuromuscular Re-ed: Patient performed the following activities with blocked practice for improved technique and motor control with sit to stand from a lower surface (low mat table): Sit to/from stand without AD with close supervision with use of hands on mat to push up 2x10 Sit to/from stand without AD with min a-CGA hands on thighs 2x5 Sit to/from stand with RW with distant supervision x10  Therapeutic Exercise: Patient performed the following exercises with verbal and tactile cues for proper technique. -seated marching x2 min for active rest break x2  Patient returned to nurses station and handed off to LPN in the w/c at end of session.    Therapy Documentation Precautions:  Precautions Precautions: Fall,Sternal,Other (comment) Precaution Comments: LVAD, sacral wound, Left BPPV Required Braces or Orthoses: Other Brace Other Brace: thumb loop and buddy strap R hand Restrictions Weight Bearing Restrictions: No RUE Weight Bearing:  (sternal precautions) Other Position/Activity Restrictions: sternal precautions   Therapy/Group: Individual Therapy  Esgar Barnick L Marquisha Nikolov PT, DPT  12/04/2020, 4:42 PM

## 2020-12-04 NOTE — Progress Notes (Signed)
ANTICOAGULATION CONSULT NOTE - Follow Up Consult  Pharmacy Consult for Warfarin Indication: LVAD  Allergies  Allergen Reactions  . Meclizine Hcl Anaphylaxis and Swelling  . Ivabradine Nausea Only    Patient Measurements: Height: 5' 10.5" (179.1 cm) Weight: 82.9 kg (182 lb 11.2 oz) IBW/kg (Calculated) : 74.15 kg  Vital Signs: Temp: 98.6 F (37 C) (05/01 0409) Temp Source: Oral (05/01 0409) BP: 109/87 (05/01 0409) Pulse Rate: 87 (05/01 0409)  Labs: Recent Labs    12/02/20 0412 12/03/20 0317 12/04/20 0532  HGB  --  9.2*  --   HCT  --  31.2*  --   PLT  --  208  --   LABPROT 21.4* 24.8* 21.3*  INR 1.9* 2.2* 1.8*  CREATININE 4.04* 5.22* 3.89*    Estimated Creatinine Clearance: 21.2 mL/min (A) (by C-G formula based on SCr of 3.89 mg/dL (H)).   Assessment: 60 yo male s/p new LVAD implant with HeartMate III on 3/10. Warfarin started per MD on 3/12, and pharmacy has now been consulted to take over warfarin dosing.   INR down this morning at 1.8, LDH stable. Does not appear warfarin was given last night so will give extra today. Nausea is improving with erythromycin which will affect warfarin dosing, also amiodarone stopped 4/26.  Goal of Therapy: INR 2-2.5 Monitor platelets by anticoagulation protocol: Yes   Plan:  Warfarin 5 mg today Daily PT/INR  Monitor for signs and symptoms of bleeding  Erin Hearing PharmD., BCPS Clinical Pharmacist 12/04/2020 7:17 AM  [facility] pharmacy phone numbers are listed on [email or web address]

## 2020-12-05 LAB — RENAL FUNCTION PANEL
Albumin: 2.7 g/dL — ABNORMAL LOW (ref 3.5–5.0)
Anion gap: 10 (ref 5–15)
BUN: 39 mg/dL — ABNORMAL HIGH (ref 6–20)
CO2: 27 mmol/L (ref 22–32)
Calcium: 9.3 mg/dL (ref 8.9–10.3)
Chloride: 99 mmol/L (ref 98–111)
Creatinine, Ser: 5.55 mg/dL — ABNORMAL HIGH (ref 0.61–1.24)
GFR, Estimated: 11 mL/min — ABNORMAL LOW (ref >60)
Glucose, Bld: 126 mg/dL — ABNORMAL HIGH (ref 70–99)
Phosphorus: 4 mg/dL (ref 2.5–4.6)
Potassium: 4.3 mmol/L (ref 3.5–5.1)
Sodium: 136 mmol/L (ref 135–145)

## 2020-12-05 LAB — PROTIME-INR
INR: 1.9 — ABNORMAL HIGH (ref 0.8–1.2)
Prothrombin Time: 22 seconds — ABNORMAL HIGH (ref 11.4–15.2)

## 2020-12-05 LAB — GLUCOSE, CAPILLARY
Glucose-Capillary: 100 mg/dL — ABNORMAL HIGH (ref 70–99)
Glucose-Capillary: 165 mg/dL — ABNORMAL HIGH (ref 70–99)
Glucose-Capillary: 157 mg/dL — ABNORMAL HIGH (ref 70–99)
Glucose-Capillary: 177 mg/dL — ABNORMAL HIGH (ref 70–99)
Glucose-Capillary: 188 mg/dL — ABNORMAL HIGH (ref 70–99)

## 2020-12-05 LAB — MAGNESIUM: Magnesium: 2.3 mg/dL (ref 1.7–2.4)

## 2020-12-05 LAB — LACTATE DEHYDROGENASE: LDH: 169 U/L (ref 98–192)

## 2020-12-05 MED ORDER — ACETAMINOPHEN 325 MG PO TABS: 325.0000 mg | ORAL_TABLET | ORAL | Status: DC | PRN

## 2020-12-05 MED ORDER — WARFARIN SODIUM 2.5 MG PO TABS
2.5000 mg | ORAL_TABLET | Freq: Once | ORAL | Status: AC
  Administered 2020-12-05: 2.5 mg via ORAL
  Filled 2020-12-05: qty 1

## 2020-12-05 NOTE — Progress Notes (Signed)
ANTICOAGULATION CONSULT NOTE - Follow Up Consult  Pharmacy Consult for Warfarin Indication: LVAD  Allergies  Allergen Reactions  . Meclizine Hcl Anaphylaxis and Swelling  . Ivabradine Nausea Only    Patient Measurements: Height: 5' 10.5" (179.1 cm) Weight: 83.2 kg (183 lb 6.1 oz) IBW/kg (Calculated) : 74.15 kg  Vital Signs: Temp: 98.1 F (36.7 C) (05/02 0403) Temp Source: Oral (05/02 0403) BP: 134/97 (05/02 0403) Pulse Rate: 83 (05/02 0403)  Labs: Recent Labs    12/03/20 0317 12/04/20 0532 12/05/20 0401  HGB 9.2*  --   --   HCT 31.2*  --   --   PLT 208  --   --   LABPROT 24.8* 21.3* 22.0*  INR 2.2* 1.8* 1.9*  CREATININE 5.22* 3.89* 5.55*    Estimated Creatinine Clearance: 14.9 mL/min (A) (by C-G formula based on SCr of 5.55 mg/dL (H)).   Assessment: 60 yo male s/p new LVAD implant with HeartMate III on 3/10. Warfarin started per MD on 3/12, and pharmacy has now been consulted to take over warfarin dosing.   INR this morning slightly below goal at 1.9, warfarin dose missed 4/30 for unclear reasons. Nausea is improving with erythromycin which will affect warfarin dosing, also amiodarone stopped 4/26.  Goal of Therapy: INR 2-2.5 Monitor platelets by anticoagulation protocol: Yes   Plan:  Warfarin 2.5 mg today Daily PT/INR  Monitor for signs and symptoms of bleeding  Arrie Senate, PharmD, BCPS, [facility] Clinical Pharmacist [phone] Please check AMION for all [place] numbers 12/05/2020

## 2020-12-05 NOTE — Progress Notes (Signed)
Marcus Johnson  Assessment/ Plan: Pt is a 60 y.o. yo male withsystolic CHF with EF of 26%, has ICD and LVAD, developed AKI and is now HD depdnent    #Acute kidney injury on CKD due to cardiorenal syndrome. Initially required CRRT and then transition to intermittent hemodialysis since 3/28.   No sign of renal recovery so far and tolerating dialysis.   Status post TDC placed by IR on 11/07/2020.  Renal navigator is following for outpatient HD center arrangement, currently doing inpatient rehab.  Status post HD on 4/30 with 1.4 L UF, tolerated well.  Plan for next HD on 5/3.  2K, 3.5h, 1 to 2L UF, no heparin,    # Vascular access: vein mapping was done on 4/1 and was seen by [facility]. At that time the plan was follow outpatient for permanent access. He is now on coumadin and LVAD. Discussed with AHF. AVF probably won't mature therefore he will need AVG. I have discussed perm access with the patient and his son over the phone. He has numbness in one hand and worries about the complication of surgery, failure, losing another hand etc. He does not want  perm access at this time.  Will f/u with [facility] as outpatient.   #Acute on chronic systolic heart failure with cardiogenic shock: Has ICD and LVAD. Volume management by dialysis. Cardiology is following. Also on midodrine.    #Anemia of CKD and critical illness: Hemoglobin is stable. Continue Aranesp.    #CKD-MBD: Calcium and phosphorus level acceptable. Monitor lab.   Subjective:   No new c/o.  Objective Vital signs in last 24 hours: Vitals:   12/04/20 1939 12/04/20 2003 12/05/20 0403 12/05/20 0500  BP: (!) 132/101 (!) 118/99 (!) 134/97   Pulse:  87 83   Resp:  17 17   Temp:  98.6 F (37 C) 98.1 F (36.7 C)   TempSrc:  Oral Oral   SpO2:  98% 96%   Weight:    83.2 kg  Height:       Weight change: 6.181 kg  Intake/Output Summary (Last 24 hours) at 12/05/2020  1357 Last data filed at 12/04/2020 2339 Gross per 24 hour  Intake --  Output 125 ml  Net -125 ml       Labs: Basic Metabolic Panel: Recent Labs  Lab 12/03/20 0317 12/04/20 0532 12/05/20 0401  NA 137 135 136  K 4.4 4.3 4.3  CL 99 98 99  CO2 28 28 27   GLUCOSE 118* 92 126*  BUN 39* 25* 39*  CREATININE 5.22* 3.89* 5.55*  CALCIUM 9.1 9.2 9.3  PHOS 3.3 3.2 4.0   Liver Function Tests: Recent Labs  Lab 12/03/20 0317 12/04/20 0532 12/05/20 0401  ALBUMIN 2.8* 2.7* 2.7*   No results for input(s): LIPASE, AMYLASE in the last 168 hours. Recent Labs  Lab 11/30/20 0423  AMMONIA 20   CBC: Recent Labs  Lab 11/29/20 0413 12/03/20 0317  WBC 9.7 8.6  HGB 9.1* 9.2*  HCT 30.3* 31.2*  MCV 88.3 89.4  PLT 198 208   Cardiac Enzymes: No results for input(s): CKTOTAL, CKMB, CKMBINDEX, TROPONINI in the last 168 hours. CBG: Recent Labs  Lab 12/04/20 1731 12/04/20 2100 12/05/20 0607 12/05/20 0823 12/05/20 1132  GLUCAP 120* 174* 100* 165* 157*    Iron Studies: No results for input(s): IRON, TIBC, TRANSFERRIN, FERRITIN in the last 72 hours. Studies/Results: No results found.  Medications: Infusions: . sodium chloride 10 mL (11/17/20 1430)  . sodium  chloride 250 mL (12/01/20 1806)    Scheduled Medications: . (feeding supplement) PROSource Plus  30 mL Oral BID BM  . vitamin C  1,000 mg Oral Daily  . Chlorhexidine Gluconate Cloth  6 each Topical BID  . Chlorhexidine Gluconate Cloth  6 each Topical Q0600  . darbepoetin (ARANESP) injection - DIALYSIS  100 mcg Intravenous Q Tue-HD  . erythromycin  250 mg Oral TID AC  . feeding supplement (NEPRO CARB STEADY)  237 mL Oral BID BM  . gabapentin  100 mg Oral QHS  . insulin aspart  0-15 Units Subcutaneous TID WC  . insulin aspart  0-5 Units Subcutaneous QHS  . insulin glargine  8 Units Subcutaneous Daily  . metoCLOPramide  10 mg Oral TID AC & HS  . midodrine  10 mg Oral TID WC  . multivitamin  1 tablet Oral QHS  . Muscle  Rub   Topical BID  . pantoprazole  40 mg Oral Daily  . polyethylene glycol  17 g Oral BID  . rosuvastatin  10 mg Oral Daily  . scopolamine  1 patch Transdermal Q72H  . senna-docusate  2 tablet Oral BID  . sorbitol  30 mL Oral Daily  . warfarin  2.5 mg Oral ONCE-1600  . Warfarin - Pharmacist Dosing Inpatient   Does not apply q1600    have reviewed scheduled and prn medications.  Physical Exam: General:NAD, comfortable Heart: Mechanical heart sound Lungs:clear b/l, no crackle Abdomen:soft, Non-tender, non-distended Extremities:No edema Dialysis Access: Right IJ TDC in place  Eulalia Ellerman B Taim Wurm 12/05/2020,1:57 PM  LOS: 21 days

## 2020-12-05 NOTE — Progress Notes (Signed)
Physical Therapy Session Note  Patient Details  Name: Marcus Johnson. MRN: [id number] Date of Birth: 01-30-1961  Today's Date: 12/05/2020 PT Individual Time: 1445-1525 PT Individual Time Calculation (min): 40 min   Short Term Goals: Week 2:  PT Short Term Goal 1 (Week 2): Patient will perform basic transfers with CGA using LRAD consistently. PT Short Term Goal 1 - Progress (Week 2): Met PT Short Term Goal 2 (Week 2): Patient will consistently ambulate >50 ft using LRAD with CGA. PT Short Term Goal 2 - Progress (Week 2): Met PT Short Term Goal 3 (Week 2): Patient will initiate stair training. PT Short Term Goal 3 - Progress (Week 2): Met Week 3:  PT Short Term Goal 1 (Week 3): STG=LTG due to ELOS.  Skilled Therapeutic Interventions/Progress Updates:   Received pt sitting EOB, pt agreeable to therapy, and reported pain 2/10 in buttocks increasing to 6/10 with mobility. Repositioning, rest breaks, and distraction done to reduce pain levels. Session with emphasis on functional mobility/transfers, generalized strengthening, dynamic standing balance/coordination, gait training, and improved activity tolerance. Switched over LVAD from monitor to battery pack vest and donned shoes sitting EOB with max A. Pt transferred sit<>stand with RW and CGA/min A and ambulated 61ft x 2 trials with RW and CGA. Pt required seated rest break due to fatigue and LE weakness. Pt performed BLE strengthening on Nustep at workload 3 decreasing to workload 2 for 3 minutes for a total of 124 steps for improved cardiovascular endurance but stopped due to reports of increased buttocks pain. Stand<>pivot Nustep<>WC with RW and CGA and pt transported back to room in [facility] total A and reconnected LVAD to monitor. Concluded session with pt sitting in WC, needs within reach, and seatbelt alarm on with NT present at bedside.   Therapy Documentation Precautions:  Precautions Precautions: Fall,Sternal,Other (comment) Precaution  Comments: LVAD, sacral wound, Left BPPV Required Braces or Orthoses: Other Brace Other Brace: thumb loop and buddy strap R hand Restrictions Weight Bearing Restrictions: No RUE Weight Bearing:  (sternal precautions) Other Position/Activity Restrictions: sternal precautions  Therapy/Group: Individual Therapy Alfonse Alpers PT, DPT   12/05/2020, 7:35 AM

## 2020-12-05 NOTE — Progress Notes (Signed)
Occupational Therapy Session Note  Patient Details  Name: Marcus Johnson. MRN: [id number] Date of Birth: May 16, 1961  Today's Date: 12/05/2020 OT Individual Time: 0815-0900 OT Individual Time Calculation (min): 45 min    Short Term Goals: Week 3:  OT Short Term Goal 1 (Week 3): Continue working toward established LTG  Skilled Therapeutic Interventions/Progress Updates:    Pt supine with 2/10 pain in his R UE and sacrum, agreeable to OT session. Pt completed bed mobility with use of bed rails and supervision overall. He required min A to stand from EOB with the RW. Ambulatory transfer to the sink with min A, myoclonic jerking requiring external stabilization. He sat at the sink and completed grooming tasks with set up assist. He stood from w/c with CGA. Pt completed LB bathing in standing with CGA. He required mod A to don pants sit <> stand. Pt required cueing/min A for LVAD line management. Pt required assist to shave d/t tremor. Pt completed UB dressing with min A. Pt was left sitting up with all needs met.   Therapy Documentation Precautions:  Precautions Precautions: Fall,Sternal,Other (comment) Precaution Comments: LVAD, sacral wound, Left BPPV Required Braces or Orthoses: Other Brace Other Brace: thumb loop and buddy strap R hand Restrictions Weight Bearing Restrictions: No RUE Weight Bearing:  (sternal precautions) Other Position/Activity Restrictions: sternal precautions   Therapy/Group: Individual Therapy  Curtis Sites 12/05/2020, 6:23 AM

## 2020-12-05 NOTE — Progress Notes (Addendum)
Patient ID: Marcus Johnson., male   DOB: 10-10-1960, 60 y.o.   MRN: [id number]  SW waiting on [facility] if able to accept referral.   SW received updates from [facility] reporting they are out of stock of transport chairs, and this item will be shipped to the home. SW to update pt.   Loralee Pacas, MSW, [place] Office: [phone] Cell: [phone] Fax: [phone]

## 2020-12-05 NOTE — Progress Notes (Signed)
Physical Therapy Session Note  Patient Details  Name: Marcus Johnson. MRN: [id number] Date of Birth: 1961/08/02  Today's Date: 12/05/2020 PT Individual Time: 0973-5329 PT Individual Time Calculation (min): 65 min   Short Term Goals: Week 3:  PT Short Term Goal 1 (Week 3): STG=LTG due to ELOS.  Skilled Therapeutic Interventions/Progress Updates:     Patient in w/c in the room on LVAD monitor upon PT arrival. Patient alert and agreeable to PT session. Patient reported 5/10 R arm pain during session, RN made aware. PT provided repositioning, rest breaks, and distraction as pain interventions throughout session.   Switched patient to battery power at beginning of session with total A due to decreased fine motor skills. Patient continues to be able to walk PT through the steps to switch from monitor to LVAD and provided appropriate safety cues.   Therapeutic Activity: Transfers: Patient performed sit to/from stand x6 from the w/c, x2 from the ADL couch, and x1 from the ADL bed with supervision with RW. Provided verbal cues for scooting to edge of seat, forward weight shift, and locating arm rest when reaching back to sit.  Gait Training:  Patient ambulated >200 feet using RW with 1 sitting rest break to apply Koban to R hand grip due to patient slipping forward. He also ambulated ~50 feet transitioning from level tile to carpet to simulate household surfaces. Ambulated with decreased gait speed, decreased step length and height,and decreased trunk rotation and improved visual scanning. Provided verbal cues forpaced breathing and increased step length.  Patient ascended/descended 5+8+8+10 6" steps using L rail with B upper extremity support to overshoot home entry set-up for increased endurance with CGA. Performed step-to gait pattern leading with R while ascending and L while descending. Provided min cues for technique and sequencing. Patient took timed 2 min seated rest breaks x3 between  each set of steps. RPE 7/10 at beginning of rest breaks and 3-4/10 at end of rest breaks.   Patient sitting EOB bed reporting increased fatigue and appropriately identifying the need to recover prior to next therapy session at end of session with breaks locked and all needs within reach. Patient missed 10 min of skilled PT due to fatigue, RN made aware. Will attempt to make-up missed time as able.     Therapy Documentation Precautions:  Precautions Precautions: Fall,Sternal,Other (comment) Precaution Comments: LVAD, sacral wound, Left BPPV Required Braces or Orthoses: Other Brace Other Brace: thumb loop and buddy strap R hand Restrictions Weight Bearing Restrictions: No RUE Weight Bearing:  (sternal precautions) Other Position/Activity Restrictions: sternal precautions General: PT Amount of Missed Time (min): 10 Minutes PT Missed Treatment Reason: Patient fatigue    Therapy/Group: Individual Therapy  Livia Tarr L Kalah Pflum PT, DPT  12/05/2020, 8:38 PM
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
## 2020-12-05 NOTE — Progress Notes (Signed)
PROGRESS NOTE   Subjective/Complaints: No problems over night. We discussed HD and that he's typically felt pretty tired afterward. Feeling well this morning. Nausea has improved but still intermittent. Had questions about sutures on chest/abdomen.   ROS: Patient denies fever, rash, sore throat, blurred vision, nausea, vomiting, diarrhea, cough, shortness of breath or chest pain, joint or back pain, headache, or mood change.     Objective:   No results found. Recent Labs    12/03/20 0317  WBC 8.6  HGB 9.2*  HCT 31.2*  PLT 208   Recent Labs    12/04/20 0532 12/05/20 0401  NA 135 136  K 4.3 4.3  CL 98 99  CO2 28 27  GLUCOSE 92 126*  BUN 25* 39*  CREATININE 3.89* 5.55*  CALCIUM 9.2 9.3    Intake/Output Summary (Last 24 hours) at 12/05/2020 0930 Last data filed at 12/04/2020 2339 Gross per 24 hour  Intake --  Output 125 ml  Net -125 ml     Pressure Injury 10/16/20 Coccyx Mid Stage 3 -  Full thickness tissue loss. Subcutaneous fat may be visible but bone, tendon or muscle are NOT exposed. Blister that has drained, 2 cm X 4.5 cm (Active)  10/16/20 0800  Location: Coccyx  Location Orientation: Mid  Staging: Stage 3 -  Full thickness tissue loss. Subcutaneous fat may be visible but bone, tendon or muscle are NOT exposed. (as per WOC note on 11/14/20)  Wound Description (Comments): Blister that has drained, 2 cm X 4.5 cm  Present on Admission: No     Pressure Injury 10/16/20 Coccyx Right Stage 2 -  Partial thickness loss of dermis presenting as a shallow open injury with a red, pink wound bed without slough. blister that has drained, 0.5 cm X 0.5 cm (Active)  10/16/20 0800  Location: Coccyx  Location Orientation: Right  Staging: Stage 2 -  Partial thickness loss of dermis presenting as a shallow open injury with a red, pink wound bed without slough.  Wound Description (Comments): blister that has drained, 0.5 cm X 0.5  cm  Present on Admission: No    Physical Exam: Vital Signs Blood pressure (!) 134/97, pulse 83, temperature 98.1 F (36.7 C), temperature source Oral, resp. rate 17, height 5' 10.5" (1.791 m), weight 83.2 kg, SpO2 96 %. Constitutional: No distress . Vital signs reviewed. HEENT: EOMI, oral membranes moist Neck: supple Cardiovascular: RRR without murmur. No JVD    Respiratory/Chest: CTA Bilaterally without wheezes or rales. Normal effort    GI/Abdomen: BS +, non-tender, non-distended Ext: no clubbing, cyanosis, or edema Psych: pleasant and cooperative Skin: tunneled cath right shoulder in place.LVAD site dry, sacral wound with pink granulation tissue. Sutures in chest.  Neuro: Alert and oriented x 3. Normal insight and awareness. STM deficits. Normal language and speech. Cranial nerve exam unremarkable  Sensory exam is normal. Reflexes are 2+ in all 4's. Fine motor coordination is intact. R>L UE tremors mild today. Motor function is grossly 4/5 with tremor. RUE  Remains 3-4/5 but still limited d/t pain in shoulder. has scattered sensory loss throughout right arm. LE motor 3+ to 4/5, extra time for sit to stand.  Musculoskeletal:  RUE moving more freely   Assessment/Plan: 1. Functional deficits which require 3+ hours per day of interdisciplinary therapy in a comprehensive inpatient rehab setting.  Physiatrist is providing close team supervision and 24 hour management of active medical problems listed below.  Physiatrist and rehab team continue to assess barriers to discharge/monitor patient progress toward functional and medical goals  Care Tool:  Bathing  Bathing activity did not occur:  (time constraints) Body parts bathed by patient: Left arm,Abdomen,Chest,Front perineal area,Right upper leg,Left upper leg,Face,Right arm,Buttocks   Body parts bathed by helper: Right lower leg,Left lower leg     Bathing assist Assist Level: Moderate Assistance - Patient 50 - 74%     Upper Body  Dressing/Undressing Upper body dressing Upper body dressing/undressing activity did not occur (including orthotics): Environmental limitations (PICC running) What is the patient wearing?: Pull over shirt    Upper body assist Assist Level: Minimal Assistance - Patient > 75%    Lower Body Dressing/Undressing Lower body dressing      What is the patient wearing?: Pants     Lower body assist Assist for lower body dressing: Moderate Assistance - Patient 50 - 74%     Toileting Toileting Toileting Activity did not occur Landscape architect and hygiene only): N/A (no void or bm)  Toileting assist Assist for toileting: Dependent - Patient 0%     Transfers Chair/bed transfer  Transfers assist  Chair/bed transfer activity did not occur: Safety/medical concerns  Chair/bed transfer assist level: Contact Guard/Touching assist Chair/bed transfer assistive device: Programmer, multimedia   Ambulation assist   Ambulation activity did not occur: Safety/medical concerns  Assist level: Contact Guard/Touching assist Assistive device: Walker-rolling Max distance: 205 ft   Walk 10 feet activity   Assist  Walk 10 feet activity did not occur: Safety/medical concerns  Assist level: Contact Guard/Touching assist Assistive device: Walker-rolling   Walk 50 feet activity   Assist Walk 50 feet with 2 turns activity did not occur: Safety/medical concerns  Assist level: Contact Guard/Touching assist Assistive device: Walker-rolling    Walk 150 feet activity   Assist Walk 150 feet activity did not occur: Safety/medical concerns  Assist level: Contact Guard/Touching assist Assistive device: Walker-rolling    Walk 10 feet on uneven surface  activity   Assist Walk 10 feet on uneven surfaces activity did not occur: Safety/medical concerns         Wheelchair     Assist Will patient use wheelchair at discharge?: No (Per PT long term goals)   Wheelchair activity  did not occur: Safety/medical concerns         Wheelchair 50 feet with 2 turns activity    Assist    Wheelchair 50 feet with 2 turns activity did not occur: Safety/medical concerns       Wheelchair 150 feet activity     Assist  Wheelchair 150 feet activity did not occur: Safety/medical concerns       Blood pressure (!) 134/97, pulse 83, temperature 98.1 F (36.7 C), temperature source Oral, resp. rate 17, height 5' 10.5" (1.791 m), weight 83.2 kg, SpO2 96 %.  Medical Problem List and Plan: 1.Functional and mobility deficitssecondary to debility after acute on chronic CHF with subsequent LVAD placement -patient maynotshower -ELOS/Goals: 5/2  -Continue CIR therapies including PT, OT    -pt with STM deficits  -scopolamine patch for ?vestibular symptoms  -changed HD to AM schedule so that we can work on stairs afterwards   -needs midodrine immediately after HD  2. Chronic systolic CHF s/p LVAD 3:  -DVT/anticoagulation:Pharmaceutical:Continue Coumadin per pharmacy -antiplatelet therapy: ASA 3.Right arm neuropathic pain/brachial plexus injury/Pain Management:Tylenol or oxycodone prn.  -- gabapentin 200 mgat bedtime with some benefit -sling prn for RUE support and comfort  -expect recovery of motor/sensory function in RUE  5/2 well controlled, continue current regimen.  4. Mood:LCSW to follow for evaluation and support. -antipsychotic agents: N/A 5. Neuropsych: This patientiscapable of making decisions on hisown behalf. 6. Skin/Wound Care:Optimize nutritional status--continue Nephro and prosource.  --Sacral decubitus: Air mattress.    -appears much improved-->change to hydrogel/gauze daily   -pt is NO LONGER RECEIVING HYDRO --Right foot wound resolved.  7. Fluids/Electrolytes/Nutrition:Strict I/O. Continue to monitor labs with HD? 8. Chronic systolic  CHF s/p LVAD 3:  --Cardiology to follow and manage cardiac issues.  -5/2 weight up Filed Weights   12/03/20 0720 12/04/20 0622 12/05/20 0500  Weight: 77 kg 82.9 kg 83.2 kg    bp/volume mgt per nephro and cards.    -off revatio   -TEDS, acclimation   - continue midodrine 10mg  tid (give dose immediately after HD) 9. Acute on chronic anemia: Continue to monitor H/H/ monitor for signs of bleeding.  --Was transfused with multiple units PRBC. --ON Nulecit Tu/Th/Sa  -transfuse as needed in HD hgb holding at 8.6 10. T2DM: Hgb A1c- monitor BS ac/hs.BS reasonable on current intake --continue Lantus in am with SSI for elevated BS.   4/23-5/2   improved after lantus decreased to 8u qam   -cover elevations with SSI for now to avoid hypogycemic events CBG (last 3)  Recent Labs    12/04/20 2100 12/05/20 0607 12/05/20 0823  GLUCAP 174* 100* 165*    11. ESRD: Hyperkalemia being managed with lokelma/HD. -see above 12. Mild leucocytosis: Resolved  13. A fib: NSR, HR remains controlled on Amiodarone, continue 14. Nausea: likely d/t to peristalsis/gastroparesis---improved  -keep bowels moving---+bm 5/1  -continue reglan 10mg  AC/HS   -amiodarone reduced per cards        LOS: 21 days A FACE TO [place] 12/05/2020, 9:30 AM

## 2020-12-05 NOTE — Progress Notes (Addendum)
Patient ID: Marcus Johnson., male   DOB: [birth year], 60 y.o.   MRN: [id number]     Advanced Heart Failure Rounding Note  PCP-Cardiologist: No primary care provider on file.   Subjective:    - 2/27 Milrinone switched to dobutamine 5 mcg.  - 2/28 Moved to ICU. Dobutamine increased to 7.5 mcg and Norepi 3 mcg added. Diuretics held.  - 3/1 Impella 5.5 placed.  - 3/4 Teeth removed - 3/10 HM3 LVAD placed - 3/13 VAD speed increased to 5500. Marcus Johnson out  - 3/14 Nephrology consulted. Given 1UPRBCs  - 3/15 Started CRRT. Given 1UPRBCs.  - 3/23 VAD Speed turned down to 5300  - 3/23 CRRT and Milrinone Stopped. Back in Afib - 3/24 CRRT restarted.  - 3/27 CVVH off.  - 3/28 iHD - 3/29 iHD. Walked 75 feet!! - 3/20 Ramp Echo: Fixed speed: 5200 Low speed limit: 4900 - 4/3 S/P RIJ HD catheter.  - 4/10 60th B-day!  - 4/11 Transferred to CIR. Back on heparin drip.  - 4/26 amio stopped due to tremors. - 4/27 Erythromycin    Reports PT is coming along. Feels well. No complaints today.    INR 1.9 LDH 169   LVAD Interrogation HM 3: Speed: 5200 Flow: 4.2  PI: 4.3 Power: 3.7  1 PI event thus far today. VAD interrogated personally. Parameters stable.   Objective:   Weight Range: 83.2 kg Body mass index is 25.94 kg/m.   Vital Signs:   Temp:  [98.1 F (36.7 C)-98.6 F (37 C)] 98.1 F (36.7 C) (05/02 0403) Pulse Rate:  [83-94] 83 (05/02 0403) Resp:  [17] 17 (05/02 0403) BP: (114-134)/(92-101) 134/97 (05/02 0403) SpO2:  [96 %-98 %] 96 % (05/02 0403) Weight:  [83.2 kg] 83.2 kg (05/02 0500) Last BM Date: 12/04/20  Weight change: Filed Weights   12/03/20 0720 12/04/20 0622 12/05/20 0500  Weight: 77 kg 82.9 kg 83.2 kg    Intake/Output:   Intake/Output Summary (Last 24 hours) at 12/05/2020 0940 Last data filed at 12/04/2020 2339 Gross per 24 hour  Intake --  Output 125 ml  Net -125 ml      Physical Exam   General:  Well appearing, sitting up in chair. No distress  HEENT:  normal  Neck: supple. JVP 10 cm.  Carotids 2+ bilat; no bruits. No lymphadenopathy or thryomegaly appreciated. Cor: LVAD hum.   Lungs: CTAB. No wheezing  Abdomen: obese soft, nontender, non-distended. No hepatosplenomegaly. No bruits or masses. Good bowel sounds. Driveline site clean. Anchor in place.  Extremities: no cyanosis, clubbing, rash. Warm no edema + bilateral TED hoses  Neuro: alert & oriented x 3. No focal deficits. Moves all 4 without problem    Labs    CBC Recent Labs    12/03/20 0317  WBC 8.6  HGB 9.2*  HCT 31.2*  MCV 89.4  PLT 701   Basic Metabolic Panel Recent Labs    12/04/20 0532 12/05/20 0401  NA 135 136  K 4.3 4.3  CL 98 99  CO2 28 27  GLUCOSE 92 126*  BUN 25* 39*  CREATININE 3.89* 5.55*  CALCIUM 9.2 9.3  MG 2.0 2.3  PHOS 3.2 4.0   Liver Function Tests Recent Labs    12/04/20 0532 12/05/20 0401  ALBUMIN 2.7* 2.7*   No results for input(s): LIPASE, AMYLASE in the last 72 hours. Cardiac Enzymes No results for input(s): CKTOTAL, CKMB, CKMBINDEX, TROPONINI in the last 72 hours.  BNP: BNP (last 3 results) Recent Labs  11/17/20 0411 11/24/20 0454 12/01/20 0000  BNP 630.2* 723.8* 740.4*    ProBNP (last 3 results) No results for input(s): PROBNP in the last 8760 hours.   D-Dimer No results for input(s): DDIMER in the last 72 hours. Hemoglobin A1C No results for input(s): HGBA1C in the last 72 hours. Fasting Lipid Panel No results for input(s): CHOL, HDL, LDLCALC, TRIG, CHOLHDL, LDLDIRECT in the last 72 hours. Thyroid Function Tests No results for input(s): TSH, T4TOTAL, T3FREE, THYROIDAB in the last 72 hours.  Invalid input(s): FREET3  Other results:   Imaging    No results found.   Medications:     Scheduled Medications: . (feeding supplement) PROSource Plus  30 mL Oral BID BM  . vitamin C  1,000 mg Oral Daily  . Chlorhexidine Gluconate Cloth  6 each Topical BID  . Chlorhexidine Gluconate Cloth  6 each Topical  Q0600  . darbepoetin (ARANESP) injection - DIALYSIS  100 mcg Intravenous Q Tue-HD  . erythromycin  250 mg Oral TID AC  . feeding supplement (NEPRO CARB STEADY)  237 mL Oral BID BM  . gabapentin  100 mg Oral QHS  . insulin aspart  0-15 Units Subcutaneous TID WC  . insulin aspart  0-5 Units Subcutaneous QHS  . insulin glargine  8 Units Subcutaneous Daily  . metoCLOPramide  10 mg Oral TID AC & HS  . midodrine  10 mg Oral TID WC  . multivitamin  1 tablet Oral QHS  . Muscle Rub   Topical BID  . pantoprazole  40 mg Oral Daily  . polyethylene glycol  17 g Oral BID  . rosuvastatin  10 mg Oral Daily  . scopolamine  1 patch Transdermal Q72H  . senna-docusate  2 tablet Oral BID  . sorbitol  30 mL Oral Daily  . Warfarin - Pharmacist Dosing Inpatient   Does not apply q1600    Infusions: . sodium chloride 10 mL (11/17/20 1430)  . sodium chloride 250 mL (12/01/20 1806)    PRN Medications: sodium chloride, sodium chloride, acetaminophen, alteplase, alum & mag hydroxide-simeth, bisacodyl, dextrose, diphenhydrAMINE, guaiFENesin-dextromethorphan, heparin, lidocaine-prilocaine, ondansetron (ZOFRAN) IV, oxyCODONE, pentafluoroprop-tetrafluoroeth, prochlorperazine **OR** prochlorperazine **OR** prochlorperazine, sodium chloride flush, sodium phosphate, traZODone, white petrolatum    Assessment/Plan   1. Acute on chronic systolic HF -> cardiogenic shock:  ICM. Has [employer] ICD. EF 15%. Low output persisted despite milrinone and then dobutamine with rise in creatinine to 2.5. Impella 5.5 placed 3/1.  Creatinine improved to 1.7 and HM3 LVAD was placed on 3/10. Developed post-op renal failure requiring CVVH. Now off milrinone and epinephrine. Last echo showed moderate RV enlargement with mildly decreased RV function and VAD speed decreased to 5300 due to left-sided septum. CRRT stopped 3/23 but restarted 3/24. CVVH stopped again on 3/27. Ramp echo 3/30 with speed decreased to 5200. Tolerating iHD for volume  management.  MAPs stable on midodrine. - Continue Midodrine 10 mg tid due to dizzinness/orthostasis (resolved for the most part).    - Continue compression stockings.   - HD for volume management  2. LVAD: 3/13 VAD speed increased to 5500. 3/23 speed decreased to 5300 and decreased to 5200 on 3/30. VAD interrogated personally. Parameters stable.LDH 169, INR 1.9 today  - On warfarin, on ASA 81 daily.  - DL site ok 3. CAD s/p CABG 2002:  Last cath in 6/19 with patent grafts. No s/s angina- Continue Crestor 40 mg daily.  4. AKI on CKD Stage 3b: Suspect that this is a combination of cardiorenal  syndrome and diabetic nephropathy and possibly ATN. Developed post-op AKI. Started on CRRT 3/15. CVVH stopped 3/23 but restarted 3/24. Now getting iHD, looks like he will be HD-dependent.  Has tunneled catheter.  - HD T/Th/S - Renal and VVS discussing, but with continuous flow LVAD, think AV fistula would be less likely to mature.  Patient wants to wait on getting AV graft until some point after discharge. No change - Renal Navigator following for clip. Says he now has a seat. 5. Atrial flutter/fibrillation: s/p DC-CV 08/21/18.  - 4/26 Off amio.  6. Wound R Foot:  Resolved.  7. Right arm pain: Likely neuropathic from stretch of brachial plexus with Impella placement.  - Continue gabapentin. Able to move right hand a little better.  8. ID:  WBCs normal.  9. DM: History of very poor control but hgbA1c trending down. Most recent 7.7%.  Insulin drip stopped 3/24. Blood glucose controlled on reduced Lantus and  sliding scale insulin to moderate.   - Diabetes coordinators have seen 10. Anemia: Stable CBC. hGB 9.2 on 4/30 11. Deconditioning - Continue to work with CIR 12. Unstageable Pressure Ulcer on buttocks: Much improved 100% granulation.  - Hydrotherapy completed - Continue repositioning 13. NSVT: Stopped amio with tremors.  14. Dizziness: Still with symptoms that seem like BPPV at times. Has had Epley  maneuvers.  15. Suspected Gastroparesis. Nausea resolved on Reglan and erythromycin.   Continue w/ CIR. Appreciate their assistance.   Lyda Jester PA-C 12/05/2020  Patient seen with PA, agree with the above note.   Strength gradually improving.  Nausea much improved.  Minimal dizziness.   Continue PT, continue regular HD.   Loralie Champagne 12/05/2020 11:03 AM

## 2020-12-05 NOTE — Progress Notes (Signed)
Patient ID: Marcus Johnson., male   DOB: 1961-05-27, 60 y.o.   MRN: [id number]     Advanced Heart Failure Rounding Note  PCP-Cardiologist: No primary care provider on file.   Subjective:    - 2/27 Milrinone switched to dobutamine 5 mcg.  - 2/28 Moved to ICU. Dobutamine increased to 7.5 mcg and Norepi 3 mcg added. Diuretics held.  - 3/1 Impella 5.5 placed.  - 3/4 Teeth removed - 3/10 HM3 LVAD placed - 3/13 VAD speed increased to 5500. [name] out  - 3/14 Nephrology consulted. Given 1UPRBCs  - 3/15 Started CRRT. Given 1UPRBCs.  - 3/23 VAD Speed turned down to 5300  - 3/23 CRRT and Milrinone Stopped. Back in Afib - 3/24 CRRT restarted.  - 3/27 CVVH off.  - 3/28 iHD - 3/29 iHD. Walked 75 feet!! - 3/20 Ramp Echo: Fixed speed: 5200 Low speed limit: 4900 - 4/3 S/P RIJ HD catheter.  - 4/10 60th B-day!  - 4/11 Transferred to CIR. Back on heparin drip.  - 4/26 amio stopped due to tremors. - 4/27 Erythromycin    Lying in bed about to take his afternoon nap. Denies SOB, orthopnea or PND. Tolaerating HD well.   INR 1.8 LDH 172  MAPs 80s   LVAD Interrogation HM 3: Speed: 5200 Flow: 4.4  PI: 5.3 Power: 4.0  1 PI event overnight. VAD interrogated personally. Parameters stable.   Objective:   Weight Range: 83.2 kg Body mass index is 25.94 kg/m.   Vital Signs:   Temp:  [98.1 F (36.7 C)-98.6 F (37 C)] 98.1 F (36.7 C) (05/02 0403) Pulse Rate:  [83-94] 83 (05/02 0403) Resp:  [17] 17 (05/02 0403) BP: (85-134)/(70-101) 134/97 (05/02 0403) SpO2:  [96 %-98 %] 96 % (05/02 0403) Weight:  [83.2 kg] 83.2 kg (05/02 0500) Last BM Date: 12/04/20  Weight change: Filed Weights   12/03/20 0720 12/04/20 0622 12/05/20 0500  Weight: 77 kg 82.9 kg 83.2 kg    Intake/Output:   Intake/Output Summary (Last 24 hours) at 12/05/2020 0750 Last data filed at 12/04/2020 2339 Gross per 24 hour  Intake 240 ml  Output 125 ml  Net 115 ml      Physical Exam   General:  NAD.  HEENT: normal   Neck: supple. JVP not elevated.  Carotids 2+ bilat; no bruits. No lymphadenopathy or thryomegaly appreciated. Cor: LVAD hum.  + HD cath Lungs: Clear. Abdomen: obese soft, nontender, non-distended. No hepatosplenomegaly. No bruits or masses. Good bowel sounds. Driveline site clean. Anchor in place.  Extremities: no cyanosis, clubbing, rash. Warm no edema  Neuro: alert & oriented x 3. No focal deficits. Moves all 4 without problem    Labs    CBC Recent Labs    12/03/20 0317  WBC 8.6  HGB 9.2*  HCT 31.2*  MCV 89.4  PLT 638   Basic Metabolic Panel Recent Labs    12/04/20 0532 12/05/20 0401  NA 135 136  K 4.3 4.3  CL 98 99  CO2 28 27  GLUCOSE 92 126*  BUN 25* 39*  CREATININE 3.89* 5.55*  CALCIUM 9.2 9.3  MG 2.0 2.3  PHOS 3.2 4.0   Liver Function Tests Recent Labs    12/04/20 0532 12/05/20 0401  ALBUMIN 2.7* 2.7*   No results for input(s): LIPASE, AMYLASE in the last 72 hours. Cardiac Enzymes No results for input(s): CKTOTAL, CKMB, CKMBINDEX, TROPONINI in the last 72 hours.  BNP: BNP (last 3 results) Recent Labs    11/17/20  0411 11/24/20 0454 12/01/20 0000  BNP 630.2* 723.8* 740.4*    ProBNP (last 3 results) No results for input(s): PROBNP in the last 8760 hours.   D-Dimer No results for input(s): DDIMER in the last 72 hours. Hemoglobin A1C No results for input(s): HGBA1C in the last 72 hours. Fasting Lipid Panel No results for input(s): CHOL, HDL, LDLCALC, TRIG, CHOLHDL, LDLDIRECT in the last 72 hours. Thyroid Function Tests No results for input(s): TSH, T4TOTAL, T3FREE, THYROIDAB in the last 72 hours.  Invalid input(s): FREET3  Other results:   Imaging    No results found.   Medications:     Scheduled Medications: . (feeding supplement) PROSource Plus  30 mL Oral BID BM  . vitamin C  1,000 mg Oral Daily  . Chlorhexidine Gluconate Cloth  6 each Topical BID  . Chlorhexidine Gluconate Cloth  6 each Topical Q0600  . darbepoetin  (ARANESP) injection - DIALYSIS  100 mcg Intravenous Q Tue-HD  . erythromycin  250 mg Oral TID AC  . feeding supplement (NEPRO CARB STEADY)  237 mL Oral BID BM  . gabapentin  100 mg Oral QHS  . insulin aspart  0-15 Units Subcutaneous TID WC  . insulin aspart  0-5 Units Subcutaneous QHS  . insulin glargine  8 Units Subcutaneous Daily  . metoCLOPramide  10 mg Oral TID AC & HS  . midodrine  10 mg Oral TID WC  . multivitamin  1 tablet Oral QHS  . Muscle Rub   Topical BID  . pantoprazole  40 mg Oral Daily  . polyethylene glycol  17 g Oral BID  . rosuvastatin  10 mg Oral Daily  . scopolamine  1 patch Transdermal Q72H  . senna-docusate  2 tablet Oral BID  . sorbitol  30 mL Oral Daily  . Warfarin - Pharmacist Dosing Inpatient   Does not apply q1600    Infusions: . sodium chloride 10 mL (11/17/20 1430)  . sodium chloride 250 mL (12/01/20 1806)    PRN Medications: sodium chloride, sodium chloride, acetaminophen, alteplase, alum & mag hydroxide-simeth, bisacodyl, dextrose, diphenhydrAMINE, guaiFENesin-dextromethorphan, heparin, lidocaine-prilocaine, ondansetron (ZOFRAN) IV, oxyCODONE, pentafluoroprop-tetrafluoroeth, prochlorperazine **OR** prochlorperazine **OR** prochlorperazine, sodium chloride flush, sodium phosphate, traZODone, white petrolatum    Assessment/Plan   1. Acute on chronic systolic HF -> cardiogenic shock:  ICM. Has Medtronic ICD. EF 15%. Low output persisted despite milrinone and then dobutamine with rise in creatinine to 2.5. Impella 5.5 placed 3/1.  Creatinine improved to 1.7 and HM3 LVAD was placed on 3/10. Developed post-op renal failure requiring CVVH. Now off milrinone and epinephrine. Last echo showed moderate RV enlargement with mildly decreased RV function and VAD speed decreased to 5300 due to left-sided septum. CRRT stopped 3/23 but restarted 3/24. CVVH stopped again on 3/27. Ramp echo 3/30 with speed decreased to 5200. Tolerating iHD for volume management.  MAPs  stable on midodrine. - Continue Midodrine 10 mg tid due to dizzinness/orthostasis (resolved for the most part).    - Continue compression stockings.   - HD for volume management  2. LVAD: 3/13 VAD speed increased to 5500. 3/23 speed decreased to 5300 and decreased to 5200 on 3/30. VAD interrogated personally. Parameters stable.LDH 169, INR 1.8 today  - On warfarin, on ASA 81 daily.  - DL site ok 3. CAD s/p CABG 2002:  Last cath in 6/19 with patent grafts. No s/s angina- Continue Crestor 40 mg daily.  4. AKI on CKD Stage 3b: Suspect that this is a combination of cardiorenal syndrome  and diabetic nephropathy and possibly ATN. Developed post-op AKI. Started on CRRT 3/15. CVVH stopped 3/23 but restarted 3/24. Now getting iHD, looks like he will be HD-dependent.  Has tunneled catheter.  - HD T/Th/S - Renal and VVS discussing, but with continuous flow LVAD, think AV fistula would be less likely to mature.  Patient wants to wait on getting AV graft until some point after discharge. No change - Renal Navigator following for clip. Says he now has a seat. 5. Atrial flutter/fibrillation: s/p DC-CV 08/21/18.  - 4/26 Off amio.  6. Wound R Foot:  Resolved.  7. Right arm pain: Likely neuropathic from stretch of brachial plexus with Impella placement.  - Continue gabapentin. Able to move right hand a little better.  8. ID:  WBCs normal.  9. DM: History of very poor control but hgbA1c trending down. Most recent 7.7%.  Insulin drip stopped 3/24. Blood glucose controlled on reduced Lantus and  sliding scale insulin to moderate.   - Diabetes coordinators have seen 10. Anemia: Stable CBC. hGB 9.2 11. Deconditioning - Continue to work with CIR 12. Unstageable Pressure Ulcer on buttocks: Much improved 100% granulation.  - Hydrotherapy completed - Continue repositioning 13. NSVT: Stopped amio with tremors.  14. Dizziness: Still with symptoms that seem like BPPV at times. Has had Epley maneuvers.  15. Suspected  Gastroparesis. Nausea resolved on Reglan and erythromycin.   Says he may be ready for CIR d/c by Friday.   Glori Bickers MD 12/05/2020

## 2020-12-05 NOTE — Progress Notes (Signed)
LVAD Coordinator Rounding Note:  HM III LVAD implanted on 10/13/20 by Dr Cyndia Bent under Destination Therapy criteria.  Discharged from [facility] and admitted to [facility] 11/14/20.  Pt sitting up in wheelchair after eating breakfast. Denies complaints.   Pt reports he had planned discharge today, but was bumped until end of week due to waiting on spot to open up at [facility].  Asked patient if he feels ready for discharge and if he can manage 3 flights of stairs at home to get to his apartment. He says PT is going to work with him the rest of this week and he plans on "working hard". Asked if he thinks he might need admission to OP rehab facility until he is stronger and reports "someone here is working on that".  He says his son is still actively looking for main level apartment, Raquel Sarna LCSW updated and she will call his son.   Spoke with Loralee Pacas, SW on this floor. She reports family is comfortable with providing care for patient on discharge and says they have completed discharge training with no reported barriers to discharge home.   Vital signs: Temp: 98.1 HR: 83 Doppler: no documented Auto cuff: 134/97 O2 Sat: 96% on RA Wt: 183.6>190.9..........181.6>176.3>178.4>180>172.8>170.8>169.7>182.7>183.3lbs    LVAD interrogation reveals:  Speed: 5200 Flow: 4.3 Power: 3.6w PI: 4.3 Hematocrit: 30  Alarms: none Events: none   Fixed speed: 5200 Low speed limit: 4900  Drive Line:  Existing VAD dressing removed and site care performed using sterile technique. Drive line exit site cleaned with Chlora prep applicators x 2,silver strip around exit site, and gauzedressingplaced over the site.Sitecontinues to improve.Exit site with near complete incorporation, the velour is fully implanted at exit site. Moderate amount ofyellowdrainage on gauze, no redness, tenderness, foul odor or rash noted. Drive line anchor re-applied.Continuetwice weekly dressing changes.Next  dressing change will be due 12/08/20. No family available for dressing change.  Labs:  LDH trend: 199>191>196.............163>175>168>184>172>169  INR trend: 1.5>1.7>1.9>2.4>3.2>2.9>3.2>2.9>2.2>2.1>2.3>1.9>1.8>1.9  Anticoagulation Plan: -INR Goal: 2.0-2.5 -ASA Dose: stopped 30 days post op per protocol  Device:  -  Medtronic single -  Pacing: VVI 40 - Therapies: on 200 bpm  Plan/Recommendations:  1. Call VAD coordinator for equipment or drive line issues 2. Twice weekly dressing changes while using acell. Next dressing due 12/08/20.   Zada Girt RN [place] Coordinator  Office: [phone]  24/7 Pager: [phone]

## 2020-12-06 ENCOUNTER — Ambulatory Visit: Payer: Medicare HMO | Admitting: Vascular Surgery

## 2020-12-06 LAB — RENAL FUNCTION PANEL
Albumin: 2.9 g/dL — ABNORMAL LOW (ref 3.5–5.0)
Anion gap: 11 (ref 5–15)
BUN: 57 mg/dL — ABNORMAL HIGH (ref 6–20)
CO2: 25 mmol/L (ref 22–32)
Calcium: 9.3 mg/dL (ref 8.9–10.3)
Chloride: 99 mmol/L (ref 98–111)
Creatinine, Ser: 6.49 mg/dL — ABNORMAL HIGH (ref 0.61–1.24)
GFR, Estimated: 9 mL/min — ABNORMAL LOW (ref >60)
Glucose, Bld: 131 mg/dL — ABNORMAL HIGH (ref 70–99)
Phosphorus: 4.1 mg/dL (ref 2.5–4.6)
Potassium: 4.4 mmol/L (ref 3.5–5.1)
Sodium: 135 mmol/L (ref 135–145)

## 2020-12-06 LAB — LACTATE DEHYDROGENASE: LDH: 175 U/L (ref 98–192)

## 2020-12-06 LAB — GLUCOSE, CAPILLARY
Glucose-Capillary: 122 mg/dL — ABNORMAL HIGH (ref 70–99)
Glucose-Capillary: 109 mg/dL — ABNORMAL HIGH (ref 70–99)
Glucose-Capillary: 143 mg/dL — ABNORMAL HIGH (ref 70–99)
Glucose-Capillary: 190 mg/dL — ABNORMAL HIGH (ref 70–99)

## 2020-12-06 LAB — MAGNESIUM: Magnesium: 2.2 mg/dL (ref 1.7–2.4)

## 2020-12-06 LAB — PROTIME-INR
INR: 2.1 — ABNORMAL HIGH (ref 0.8–1.2)
Prothrombin Time: 23.3 seconds — ABNORMAL HIGH (ref 11.4–15.2)

## 2020-12-06 MED ORDER — HEPARIN SODIUM (PORCINE) 1000 UNIT/ML IJ SOLN
INTRAMUSCULAR | Status: AC
  Filled 2020-12-06: qty 1

## 2020-12-06 MED ORDER — WARFARIN SODIUM 2 MG PO TABS
2.0000 mg | ORAL_TABLET | Freq: Once | ORAL | Status: AC
  Administered 2020-12-06: 2 mg via ORAL
  Filled 2020-12-06 (×2): qty 1

## 2020-12-06 MED ORDER — DARBEPOETIN ALFA 100 MCG/0.5ML IJ SOSY
PREFILLED_SYRINGE | INTRAMUSCULAR | Status: AC
  Filled 2020-12-06: qty 0.5

## 2020-12-06 NOTE — Procedures (Signed)
I was present at this dialysis session. I have reviewed the session itself and made appropriate changes.   TOl HD, TDC qb 400. No c/o or needs. Next HD 5/5 RS  Filed Weights   12/04/20 0622 12/05/20 0500 12/06/20 0437  Weight: 82.9 kg 83.2 kg 83.1 kg    Recent Labs  Lab 12/06/20 0414  NA 135  K 4.4  CL 99  CO2 25  GLUCOSE 131*  BUN 57*  CREATININE 6.49*  CALCIUM 9.3  PHOS 4.1    Recent Labs  Lab 12/03/20 0317  WBC 8.6  HGB 9.2*  HCT 31.2*  MCV 89.4  PLT 208    Scheduled Meds: . (feeding supplement) PROSource Plus  30 mL Oral BID BM  . vitamin C  1,000 mg Oral Daily  . Chlorhexidine Gluconate Cloth  6 each Topical BID  . Chlorhexidine Gluconate Cloth  6 each Topical Q0600  . darbepoetin (ARANESP) injection - DIALYSIS  100 mcg Intravenous Q Tue-HD  . erythromycin  250 mg Oral TID AC  . feeding supplement (NEPRO CARB STEADY)  237 mL Oral BID BM  . gabapentin  100 mg Oral QHS  . insulin aspart  0-15 Units Subcutaneous TID WC  . insulin aspart  0-5 Units Subcutaneous QHS  . insulin glargine  8 Units Subcutaneous Daily  . metoCLOPramide  10 mg Oral TID AC & HS  . midodrine  10 mg Oral TID WC  . multivitamin  1 tablet Oral QHS  . Muscle Rub   Topical BID  . pantoprazole  40 mg Oral Daily  . polyethylene glycol  17 g Oral BID  . rosuvastatin  10 mg Oral Daily  . scopolamine  1 patch Transdermal Q72H  . senna-docusate  2 tablet Oral BID  . sorbitol  30 mL Oral Daily  . Warfarin - Pharmacist Dosing Inpatient   Does not apply q1600   Continuous Infusions: . sodium chloride 10 mL (11/17/20 1430)  . sodium chloride 250 mL (12/01/20 1806)   PRN Meds:.sodium chloride, sodium chloride, acetaminophen, alteplase, alum & mag hydroxide-simeth, bisacodyl, dextrose, diphenhydrAMINE, guaiFENesin-dextromethorphan, heparin, lidocaine-prilocaine, ondansetron (ZOFRAN) IV, oxyCODONE, pentafluoroprop-tetrafluoroeth, prochlorperazine **OR** prochlorperazine **OR** prochlorperazine,  sodium chloride flush, sodium phosphate, traZODone, white petrolatum   Pearson Grippe  MD 12/06/2020, 9:18 AM

## 2020-12-06 NOTE — Progress Notes (Signed)
Renal Navigator sent message to [facility] Manager to request update on outpatient HD seat schedule and provided update on targeted discharge date of 12/09/20. Navigator informed her that patient's son can transport patient if he has a schedule that allows for patient's son to get to work by 4:00pm, per [employer] CSW. Navigator realizes that this may not be possible at this time, given how limited appointment times are at the clinic at this time. He may need to utilize [facility] [place] until he can be moved in to an earlier time. Navigator will continue to follow closely.  Alphonzo Cruise, [place] Renal Navigator [phone]

## 2020-12-06 NOTE — Progress Notes (Signed)
LVAD Coordinator Rounding Note:  HM III LVAD implanted on 10/13/20 by Dr Cyndia Bent under Destination Therapy criteria.  Discharged from [facility] and admitted to [facility] 11/14/20.  Pt laying back in chair resting during HD treatment. Denies complaints other than 2/10 sacral pain. He is hopeful he can participate in PT later today "as long as I don't feel wiped out."   Discharge was bumped until end of week due to waiting on spot to open up at [facility].  He says his son is still actively looking for main level apartment, Raquel Sarna LCSW updated and she will call his son. Current plan is for patient to be discharged home per Loralee Pacas SW. She reports family is comfortable with providing care for patient on discharge and says they have completed discharge training with no reported barriers to discharge home.   Vital signs: Temp: 98.4 HR: 81 Doppler: no documented Auto cuff: 106/84 (95) O2 Sat: 98% on RA Wt: 183.6>190.9..........181.6>176.3>178.4>180>172.8>170.8>169.7>182.7>183.3>183.2 lbs    LVAD interrogation reveals:  Speed: 5200 Flow: 3.9 Power: 3.8w PI: 6.5 Hematocrit: 30  Alarms: none Events: none   Fixed speed: 5200 Low speed limit: 4900  Drive Line:  Existing VAD dressing clean, dry, intact. Anchor correctly applied. Continuetwice weekly dressing changes.Next dressing change will be due 12/08/20. No family available for dressing change.  Labs:  LDH trend: 199>191>196.............163>175>168>184>172>169>175  INR trend: 1.5>1.7>1.9>2.4>3.2>2.9>3.2>2.9>2.2>2.1>2.3>1.9>1.8>1.9>2.1  Anticoagulation Plan: -INR Goal: 2.0-2.5 -ASA Dose: stopped 30 days post op per protocol  Device:  -  Medtronic single -  Pacing: VVI 40 - Therapies: on 200 bpm  Plan/Recommendations:  1. Call VAD coordinator for equipment or drive line issues 2. Twice weekly dressing changes while using acell. Next dressing due 12/08/20.   Emerson Monte RN [place] Coordinator  Office:  [phone]  24/7 Pager: [phone]

## 2020-12-06 NOTE — Progress Notes (Signed)
PROGRESS NOTE   Subjective/Complaints: No problems overnight. For AM HD today d/t need to work on steps post-HD  ROS: Patient denies fever, rash, sore throat, blurred vision, nausea, vomiting, diarrhea, cough, shortness of breath or chest pain,  headache, or mood change.     Objective:   No results found. No results for input(s): WBC, HGB, HCT, PLT in the last 72 hours. Recent Labs    12/05/20 0401 12/06/20 0414  NA 136 135  K 4.3 4.4  CL 99 99  CO2 27 25  GLUCOSE 126* 131*  BUN 39* 57*  CREATININE 5.55* 6.49*  CALCIUM 9.3 9.3    Intake/Output Summary (Last 24 hours) at 12/06/2020 1212 Last data filed at 12/06/2020 0732 Gross per 24 hour  Intake 240 ml  Output --  Net 240 ml     Pressure Injury 10/16/20 Coccyx Mid Stage 3 -  Full thickness tissue loss. Subcutaneous fat may be visible but bone, tendon or muscle are NOT exposed. Blister that has drained, 2 cm X 4.5 cm (Active)  10/16/20 0800  Location: Coccyx  Location Orientation: Mid  Staging: Stage 3 -  Full thickness tissue loss. Subcutaneous fat may be visible but bone, tendon or muscle are NOT exposed. (as per WOC note on 11/14/20)  Wound Description (Comments): Blister that has drained, 2 cm X 4.5 cm  Present on Admission: No     Pressure Injury 10/16/20 Coccyx Right Stage 2 -  Partial thickness loss of dermis presenting as a shallow open injury with a red, pink wound bed without slough. blister that has drained, 0.5 cm X 0.5 cm (Active)  10/16/20 0800  Location: Coccyx  Location Orientation: Right  Staging: Stage 2 -  Partial thickness loss of dermis presenting as a shallow open injury with a red, pink wound bed without slough.  Wound Description (Comments): blister that has drained, 0.5 cm X 0.5 cm  Present on Admission: No    Physical Exam: Vital Signs Blood pressure 117/87, pulse 67, temperature 98.4 F (36.9 C), temperature source Oral, resp. rate  18, height 5' 10.5" (1.791 m), weight 83.1 kg, SpO2 98 %. Constitutional: No distress . Vital signs reviewed. HEENT: EOMI, oral membranes moist Neck: supple Cardiovascular: RRR without murmur. No JVD    Respiratory/Chest: CTA Bilaterally without wheezes or rales. Normal effort    GI/Abdomen: BS +, non-tender, non-distended Ext: no clubbing, cyanosis, or edema Psych: pleasant and cooperative Skin:  cath right shoulder in place.LVAD site dry, sacral wound with pink granulation tissue--not visualized today.  .  Neuro: Alert and oriented x 3. Normal insight and awareness. STM deficits. Normal language and speech. Cranial nerve exam unremarkable  Sensory exam is normal. Reflexes are 2+ in all 4's. Fine motor coordination is intact. R>L UE tremors mild today. Motor function is grossly 4/5 with tremor. RUE  4-/5.  LE motor 4/5 with more weakness proximally.  Musculoskeletal:  RUE moving more freely   Assessment/Plan: 1. Functional deficits which require 3+ hours per day of interdisciplinary therapy in a comprehensive inpatient rehab setting.  Physiatrist is providing close team supervision and 24 hour management of active medical problems listed below.  Physiatrist and rehab  team continue to assess barriers to discharge/monitor patient progress toward functional and medical goals  Care Tool:  Bathing  Bathing activity did not occur:  (time constraints) Body parts bathed by patient: Left arm,Abdomen,Chest,Front perineal area,Right upper leg,Left upper leg,Face,Right arm,Buttocks   Body parts bathed by helper: Right lower leg,Left lower leg     Bathing assist Assist Level: Moderate Assistance - Patient 50 - 74%     Upper Body Dressing/Undressing Upper body dressing Upper body dressing/undressing activity did not occur (including orthotics): Environmental limitations (PICC running) What is the patient wearing?: Pull over shirt    Upper body assist Assist Level: Minimal Assistance - Patient  > 75%    Lower Body Dressing/Undressing Lower body dressing      What is the patient wearing?: Pants     Lower body assist Assist for lower body dressing: Moderate Assistance - Patient 50 - 74%     Toileting Toileting Toileting Activity did not occur Landscape architect and hygiene only): N/A (no void or bm)  Toileting assist Assist for toileting: Dependent - Patient 0%     Transfers Chair/bed transfer  Transfers assist  Chair/bed transfer activity did not occur: Safety/medical concerns  Chair/bed transfer assist level: Supervision/Verbal cueing Chair/bed transfer assistive device: Programmer, multimedia   Ambulation assist   Ambulation activity did not occur: Safety/medical concerns  Assist level: Contact Guard/Touching assist Assistive device: Walker-rolling Max distance: >100 ft   Walk 10 feet activity   Assist  Walk 10 feet activity did not occur: Safety/medical concerns  Assist level: Contact Guard/Touching assist Assistive device: Walker-rolling   Walk 50 feet activity   Assist Walk 50 feet with 2 turns activity did not occur: Safety/medical concerns  Assist level: Contact Guard/Touching assist Assistive device: Walker-rolling    Walk 150 feet activity   Assist Walk 150 feet activity did not occur: Safety/medical concerns  Assist level: Contact Guard/Touching assist Assistive device: Walker-rolling    Walk 10 feet on uneven surface  activity   Assist Walk 10 feet on uneven surfaces activity did not occur: Safety/medical concerns         Wheelchair     Assist Will patient use wheelchair at discharge?: No (Per PT long term goals)   Wheelchair activity did not occur: Safety/medical concerns         Wheelchair 50 feet with 2 turns activity    Assist    Wheelchair 50 feet with 2 turns activity did not occur: Safety/medical concerns       Wheelchair 150 feet activity     Assist  Wheelchair 150 feet  activity did not occur: Safety/medical concerns       Blood pressure 117/87, pulse 67, temperature 98.4 F (36.9 C), temperature source Oral, resp. rate 18, height 5' 10.5" (1.791 m), weight 83.1 kg, SpO2 98 %.  Medical Problem List and Plan: 1.Functional and mobility deficitssecondary to debility after acute on chronic CHF with subsequent LVAD placement -patient maynotshower -ELOS/Goals: 5/2  -Continue CIR therapies including PT, OT    -pt with STM deficits  -scopolamine patch for ?vestibular symptoms--dc today 5/3  -changed HD to AM schedule so that we can work on stairs afterwards   -needs midodrine immediately after HD 2. Chronic systolic CHF s/p LVAD 3:  -DVT/anticoagulation:Pharmaceutical:Continue Coumadin per pharmacy -antiplatelet therapy: ASA 3.Right arm neuropathic pain/brachial plexus injury/Pain Management:Tylenol or oxycodone prn.  -- gabapentin 200 mgat bedtime with some benefit -sling prn for RUE support and comfort  -expect recovery of motor/sensory  function in RUE  5/3 well controlled, continue current regimen.  4. Mood:LCSW to follow for evaluation and support. -antipsychotic agents: N/A 5. Neuropsych: This patientiscapable of making decisions on hisown behalf. 6. Skin/Wound Care:Optimize nutritional status--continue Nephro and prosource.  --Sacral decubitus: Air mattress.    -appears much improved-->change to hydrogel/gauze daily   -pt is NO LONGER RECEIVING HYDRO --Right foot wound resolved.  7. Fluids/Electrolytes/Nutrition:Strict I/O. Continue to monitor labs with HD? 8. Chronic systolic CHF s/p LVAD 3:  --Cardiology to follow and manage cardiac issues.  -5/3 weights stable Filed Weights   12/04/20 0622 12/05/20 0500 12/06/20 0437  Weight: 82.9 kg 83.2 kg 83.1 kg    bp/volume mgt per nephro and cards.    -off  revatio   -TEDS, acclimation   - continue midodrine 10mg  tid (give dose immediately after HD to prop up bp for stairs/activity) 9. Acute on chronic anemia: Continue to monitor H/H/ monitor for signs of bleeding.  --Was transfused with multiple units PRBC. --ON Nulecit Tu/Th/Sa  -transfuse as needed in HD hgb holding at 8.6 10. T2DM: Hgb A1c- monitor BS ac/hs.BS reasonable on current intake --continue Lantus in am with SSI for elevated BS.    5/2   improved after lantus decreased to 8u qam   -continue to cover elevations with SSI for now to avoid hypogycemic events CBG (last 3)  Recent Labs    12/05/20 1654 12/05/20 2026 12/06/20 0607  GLUCAP 177* 188* 122*    11. ESRD: Hyperkalemia being managed with lokelma/HD. -see above 12. Mild leucocytosis: Resolved  13. A fib: NSR, HR remains controlled on Amiodarone, continue 14. Nausea: likely d/t to peristalsis/gastroparesis---improved  -keep bowels moving---+bm 5/1  -continue reglan 10mg  AC/HS   -amiodarone reduced per cards        LOS: 22 days A FACE TO [place] 12/06/2020, 12:12 PM

## 2020-12-06 NOTE — Progress Notes (Addendum)
Patient ID: Marcus Johnson., male   DOB: January 27, 1961, 60 y.o.   MRN: [id number]     Advanced Heart Failure Rounding Note  PCP-Cardiologist: None   Subjective:    - 2/27 Milrinone switched to dobutamine 5 mcg.  - 2/28 Moved to ICU. Dobutamine increased to 7.5 mcg and Norepi 3 mcg added. Diuretics held.  - 3/1 Impella 5.5 placed.  - 3/4 Teeth removed - 3/10 HM3 LVAD placed - 3/13 VAD speed increased to 5500. [name] out  - 3/14 Nephrology consulted. Given 1UPRBCs  - 3/15 Started CRRT. Given 1UPRBCs.  - 3/23 VAD Speed turned down to 5300  - 3/23 CRRT and Milrinone Stopped. Back in Afib - 3/24 CRRT restarted.  - 3/27 CVVH off.  - 3/28 iHD - 3/29 iHD. Walked 75 feet!! - 3/20 Ramp Echo: Fixed speed: 5200 Low speed limit: 4900 - 4/3 S/P RIJ HD catheter.  - 4/10 60th B-day!  - 4/11 Transferred to CIR. Back on heparin drip.  - 4/26 amio stopped due to tremors. - 4/27 Erythromycin    Had HD earlier today. Now feels tired. Just started afteroon PT session.   INR 2.1 LDH 175  LVAD Interrogation HM 3: Speed: 5200 Flow: 4.4  PI: 3.9 Power: 3.8  VAD interrogated personally. Parameters stable.   Objective:   Weight Range: 83.1 kg Body mass index is 25.92 kg/m.   Vital Signs:   Temp:  [97.8 F (36.6 C)-98.7 F (37.1 C)] 98.2 F (36.8 C) (05/03 1304) Pulse Rate:  [64-97] 92 (05/03 1304) Resp:  [14-19] 19 (05/03 1304) BP: (96-125)/(53-102) 104/53 (05/03 1304) SpO2:  [98 %-100 %] 100 % (05/03 1304) Weight:  [83.1 kg] 83.1 kg (05/03 0437) Last BM Date: 12/04/20  Weight change: Filed Weights   12/05/20 0500 12/06/20 0437  Weight: 83.2 kg 83.1 kg    Intake/Output:   Intake/Output Summary (Last 24 hours) at 12/06/2020 1306 Last data filed at 12/06/2020 1210 Gross per 24 hour  Intake 240 ml  Output 2000 ml  Net -1760 ml      Physical Exam   General:  Well appearing, participating in PT currently No distress  HEENT: normal  Neck: supple. No JVP .  Carotids 2+ bilat;  no bruits. No lymphadenopathy or thryomegaly appreciated. Cor: LVAD hum.   Lungs: clear  Abdomen: obese soft, nontender, non-distended. No hepatosplenomegaly. No bruits or masses. Good bowel sounds. Driveline site clean. Anchor in place.  Extremities: no cyanosis, clubbing, rash. Warm no edema  Neuro: alert & oriented x 3. No focal deficits. Moves all 4 without problem    Labs    CBC No results for input(s): WBC, NEUTROABS, HGB, HCT, MCV, PLT in the last 72 hours. Basic Metabolic Panel Recent Labs    12/05/20 0401 12/06/20 0414  NA 136 135  K 4.3 4.4  CL 99 99  CO2 27 25  GLUCOSE 126* 131*  BUN 39* 57*  CREATININE 5.55* 6.49*  CALCIUM 9.3 9.3  MG 2.3 2.2  PHOS 4.0 4.1   Liver Function Tests Recent Labs    12/05/20 0401 12/06/20 0414  ALBUMIN 2.7* 2.9*   No results for input(s): LIPASE, AMYLASE in the last 72 hours. Cardiac Enzymes No results for input(s): CKTOTAL, CKMB, CKMBINDEX, TROPONINI in the last 72 hours.  BNP: BNP (last 3 results) Recent Labs    11/17/20 0411 11/24/20 0454 12/01/20 0000  BNP 630.2* 723.8* 740.4*    ProBNP (last 3 results) No results for input(s): PROBNP in the last  8760 hours.   D-Dimer No results for input(s): DDIMER in the last 72 hours. Hemoglobin A1C No results for input(s): HGBA1C in the last 72 hours. Fasting Lipid Panel No results for input(s): CHOL, HDL, LDLCALC, TRIG, CHOLHDL, LDLDIRECT in the last 72 hours. Thyroid Function Tests No results for input(s): TSH, T4TOTAL, T3FREE, THYROIDAB in the last 72 hours.  Invalid input(s): FREET3  Other results:   Imaging    No results found.   Medications:     Scheduled Medications: . (feeding supplement) PROSource Plus  30 mL Oral BID BM  . vitamin C  1,000 mg Oral Daily  . Chlorhexidine Gluconate Cloth  6 each Topical BID  . Chlorhexidine Gluconate Cloth  6 each Topical Q0600  . darbepoetin (ARANESP) injection - DIALYSIS  100 mcg Intravenous Q Tue-HD  .  erythromycin  250 mg Oral TID AC  . feeding supplement (NEPRO CARB STEADY)  237 mL Oral BID BM  . gabapentin  100 mg Oral QHS  . heparin sodium (porcine)      . insulin aspart  0-15 Units Subcutaneous TID WC  . insulin aspart  0-5 Units Subcutaneous QHS  . insulin glargine  8 Units Subcutaneous Daily  . metoCLOPramide  10 mg Oral TID AC & HS  . midodrine  10 mg Oral TID WC  . multivitamin  1 tablet Oral QHS  . Muscle Rub   Topical BID  . pantoprazole  40 mg Oral Daily  . polyethylene glycol  17 g Oral BID  . rosuvastatin  10 mg Oral Daily  . scopolamine  1 patch Transdermal Q72H  . senna-docusate  2 tablet Oral BID  . sorbitol  30 mL Oral Daily  . warfarin  2 mg Oral ONCE-1600  . Warfarin - Pharmacist Dosing Inpatient   Does not apply q1600    Infusions: . sodium chloride 10 mL (11/17/20 1430)  . sodium chloride 250 mL (12/01/20 1806)    PRN Medications: sodium chloride, sodium chloride, acetaminophen, alteplase, alum & mag hydroxide-simeth, bisacodyl, dextrose, diphenhydrAMINE, guaiFENesin-dextromethorphan, heparin, lidocaine-prilocaine, ondansetron (ZOFRAN) IV, oxyCODONE, pentafluoroprop-tetrafluoroeth, prochlorperazine **OR** prochlorperazine **OR** prochlorperazine, sodium chloride flush, sodium phosphate, traZODone, white petrolatum    Assessment/Plan   1. Acute on chronic systolic HF -> cardiogenic shock:  ICM. Has Medtronic ICD. EF 15%. Low output persisted despite milrinone and then dobutamine with rise in creatinine to 2.5. Impella 5.5 placed 3/1.  Creatinine improved to 1.7 and HM3 LVAD was placed on 3/10. Developed post-op renal failure requiring CVVH. Now off milrinone and epinephrine. Last echo showed moderate RV enlargement with mildly decreased RV function and VAD speed decreased to 5300 due to left-sided septum. CRRT stopped 3/23 but restarted 3/24. CVVH stopped again on 3/27. Ramp echo 3/30 with speed decreased to 5200. Tolerating iHD for volume management.  MAPs  stable on midodrine. - Continue Midodrine 10 mg tid due to dizzinness/orthostasis (resolved for the most part).    - Continue compression stockings.   - HD for volume management  2. LVAD: 3/13 VAD speed increased to 5500. 3/23 speed decreased to 5300 and decreased to 5200 on 3/30. VAD interrogated personally. Parameters stable.LDH 175, INR 2.1 today  - On warfarin, on ASA 81 daily.  - DL site ok 3. CAD s/p CABG 2002:  Last cath in 6/19 with patent grafts. No s/s angina- Continue Crestor 40 mg daily.  4. AKI on CKD Stage 3b: Suspect that this is a combination of cardiorenal syndrome and diabetic nephropathy and possibly ATN. Developed post-op  AKI. Started on CRRT 3/15. CVVH stopped 3/23 but restarted 3/24. Now getting iHD, looks like he will be HD-dependent.  Has tunneled catheter.  - HD T/Th/S - Renal and VVS discussing, but with continuous flow LVAD, think AV fistula would be less likely to mature.  Patient wants to wait on getting AV graft until some point after discharge. No change - Renal Navigator following for clip. Says he now has a seat. 5. Atrial flutter/fibrillation: s/p DC-CV 08/21/18.  - 4/26 Off amio.  6. Wound R Foot:  Resolved.  7. Right arm pain: Likely neuropathic from stretch of brachial plexus with Impella placement.  - Continue gabapentin. Able to move right hand a little better.  8. ID:  WBCs normal.  9. DM: History of very poor control but hgbA1c trending down. Most recent 7.7%.  Insulin drip stopped 3/24. Blood glucose controlled on reduced Lantus and  sliding scale insulin to moderate.   - Diabetes coordinators have seen 10. Anemia: Stable CBC. hGB 9.2 on 4/30 11. Deconditioning - Continue to work with CIR. Appreciate their assistance  12. Unstageable Pressure Ulcer on buttocks: Much improved 100% granulation.  - Hydrotherapy completed - Continue repositioning 13. NSVT: Stopped amio with tremors.  14. Dizziness: Still with symptoms that seem like BPPV at times. Has  had Epley maneuvers.  15. Suspected Gastroparesis. Nausea resolved on Reglan and erythromycin.   Per CIR, anticipate d/c to home on 5/6. Nephrology working to secure outpatient HD seat.   Lyda Jester PA-C 12/06/2020  Agree with the above note.   Stable today, nausea resolved.  HD today.  Continues to work with PT. LVAD parameters stable.   Home planned for 5/6.   Loralie Champagne 12/06/2020

## 2020-12-06 NOTE — Progress Notes (Signed)
Physical Therapy Session Note  Patient Details  Name: Marcus Johnson. MRN: [id number] Date of Birth: 03-30-1961  Today's Date: 12/06/2020 PT Individual Time: 6644-0347 PT Individual Time Calculation (min): 53 min  PT Co-Treatment Time: 1415-1430 PT Co-Treatment Time Calculation (min): 15 min  Short Term Goals: Week 3:  PT Short Term Goal 1 (Week 3): STG=LTG due to ELOS.  Skilled Therapeutic Interventions/Progress Updates:     Patient in main therapy gym on battery power for LVAD with Katharine Look, [place] for 15 min co-treatment for stair navigation upon PT arrival. Patient alert and agreeable to PT session. Patient denied pain during session.  Focused session on stair training after dialysis to simulate home entry at d/c on dialysis days.   Therapeutic Activity: Transfers: Patient performed sit to/from stand x10 with supervision with RW and CGA without RW. Provided verbal cues for forward weight shift and controlled descent. Provided hand-over-hand for locating arm rest x3.  Gait Training:  Patient ambulated >100 feet using RW with CGA and w/c follow for safety due to decreased activity tolerance. Ambulated with decreased gait speed, step height, step length, trunk rotation, and visual scanning. Provided verbal cues for increased step length, paced breathing for improved breath support, and increased visual scanning in the environment.  Stair Training: Patient ascended/descended 4+8+4+8+4 steps (28 steps total) with rest breaks between each trial using B upper extremities on L rail to simulate home set-up with CGA. Performed step gait pattern leading with R while ascending and L while descending. Provided cues for technique, pacing, and sequencing. Patient unable to tolerate the exact home set-up (5+7+7+7 steps, total 26 steps) due to increased fatigue following dialysis, however, tolerated 2 more steps than the total number of steps necessary with only 1 additional rest break than is  available (plan to rest at each landing between flights), demonstrating significant improvement since last trial following dialysis (4+4 steps).  Educated on taking time to rest and eat following dialysis at discharge before returning home to perform steps to enter third floor apartment. Recommending determining a back-up location to stay if he is not feeling well following dialysis one day to ensure safety, patient in agreement, but unsure of where this would be at this time. He continues to report that his family is working diligently to secure and new living space with more appropriate access for patient's current medical condition.   Neuromuscular Re-ed: Patient performed the following activities: -standing balance while using R hand to grab and place cards to it's match on the back of the tall mirror, dropped 2/14 cards due to decreased grip strength and motor control -standing attempted to bend down to pick up cards on the floor x1, unable to reach due to gluteal and hamstring tightness, progressed to using a reacher in his L hand to pick them up in standing with min A for card placement, attempted to place the card in his R hand from the reacher in standing and in sitting with poor success, but very focused effort for R hand control  Manual Therapy: Patient reported R arm tightness/fatigue after other activities performed during session. Performed R median nerve glide with elbow extended and wrist performing flexion extension, attempted to add cervical lateral flexion/extension, however patient with very poor lateral flexion ROM and no difference reported with this addition. Performed soft tissue mobilization and trigger point release to upper traps and scalenes due to palpable muscle tightness and positive jerk response to trigger points. Followed with manual upper trap stretch with opposite  lateral cervical flexion and depression at the acromion 2x1 min  Patient in w/c on battery power in  preparation for upcoming OT session, OT made aware, at end of session with breaks locked and all needs within reach.    Therapy Documentation Precautions:  Precautions Precautions: Fall,Sternal,Other (comment) Precaution Comments: LVAD, sacral wound, Left BPPV Required Braces or Orthoses: Other Brace Other Brace: thumb loop and buddy strap R hand Restrictions Weight Bearing Restrictions: No RUE Weight Bearing:  (sternal precautions) Other Position/Activity Restrictions: sternal precautions General: PT Amount of Missed Time (min): 7 Minutes PT Missed Treatment Reason: Patient fatigue   Therapy/Group: Individual Therapy  [name] L Errik Mitchelle PT, DPT  12/06/2020, 8:55 PM

## 2020-12-06 NOTE — Progress Notes (Signed)
Patient ID: Marcus Vandyken., male   DOB: 12-11-1960, 60 y.o.   MRN: [id number]  SW received updates from [place] reporting still waiting on a seat from [facility].    SW made efforts to update pt from team conference, but pt off floor due to dialysis.   *SW spoke with [name] [phone]) to provide updates from team conference on some gains made in rehab with practicing stairs. While completed, pt continued to require frequent rest breaks. SW also informed about working on a HHA, and on above with regard to a dialysis seat pending which could potentially delay his discharge. SW to follow-up once there is more information.   HHPT/OT/SN referral accepted by [facility].   Loralee Pacas, MSW, [place] Office: [phone] Cell: [phone] Fax: [phone]

## 2020-12-06 NOTE — Progress Notes (Signed)
ANTICOAGULATION CONSULT NOTE - Follow Up Consult  Pharmacy Consult for Warfarin Indication: LVAD  Allergies  Allergen Reactions  . Meclizine Hcl Anaphylaxis and Swelling  . Ivabradine Nausea Only    Patient Measurements: Height: 5' 10.5" (179.1 cm) Weight:  (floor to weigh) IBW/kg (Calculated) : 74.15 kg  Vital Signs: Temp: 98.4 F (36.9 C) (05/03 0833) Temp Source: Oral (05/03 0833) BP: 119/83 (05/03 0930) Pulse Rate: 64 (05/03 0930)  Labs: Recent Labs    12/04/20 0532 12/05/20 0401 12/06/20 0414  LABPROT 21.3* 22.0* 23.3*  INR 1.8* 1.9* 2.1*  CREATININE 3.89* 5.55* 6.49*    Estimated Creatinine Clearance: 12.7 mL/min (A) (by C-G formula based on SCr of 6.49 mg/dL (H)).   Assessment: 60 yo male s/p new LVAD implant with HeartMate III on 3/10. Warfarin started per MD on 3/12, and pharmacy has now been consulted to take over warfarin dosing.   INR this morning therapeutic at 2.1, warfarin dose missed 4/30 for unclear reasons. Nausea is improving with erythromycin which will affect warfarin dosing, also amiodarone stopped 4/26.  Goal of Therapy: INR 2-2.5 Monitor platelets by anticoagulation protocol: Yes   Plan:  Warfarin 2mg  today Daily PT/INR  Monitor for signs and symptoms of bleeding  Arrie Senate, PharmD, BCPS, [facility] Clinical Pharmacist [phone] Please check AMION for all [place] numbers 12/06/2020

## 2020-12-06 NOTE — Progress Notes (Signed)
Occupational Therapy Session Note  Patient Details  Name: Marcus Johnson. MRN: [id number] Date of Birth: Feb 21, 1961  Today's Date: 12/06/2020 OT Individual Time: 1330-1415 OT Individual Time Calculation (min): 45 min  and Today's Date: 12/06/2020 OT Co-Treatment Time: 1415-1430 OT Co-Treatment Time Calculation (min): 15 min   Short Term Goals: Week 3:  OT Short Term Goal 1 (Week 3): Continue working toward established LTG  Skilled Therapeutic Interventions/Progress Updates:    Pt received sitting EOB awaiting lunch delivery. Discussed d/c planning while pt ate. Pt completed sit > stand from EOB with close supervision. CGA ambulatory transfer to the sink. He reported dizziness but it subsided upon sitting. Pt completed oral care with set up assist while seated. He was provided max A for shaving task d/t tremor making him nervous to use a disposable razor independently. Pt donned his shoes with min A. He provided caregiver instructions to switch LVAD from wall to battery powered, requiring only min questioning cueing. Pt was taken to the therapy gym via w/c. He completed short distance functional mobility with CGA using RW before PT joined session. Remainder of session focused on functional activity tolerance and dynamic standing balance in the context of stair training. See PT note for specifics of stair training. Pt was left with PT in the gym.   Therapy Documentation Precautions:  Precautions Precautions: Fall,Sternal,Other (comment) Precaution Comments: LVAD, sacral wound, Left BPPV Required Braces or Orthoses: Other Brace Other Brace: thumb loop and buddy strap R hand Restrictions Weight Bearing Restrictions: No RUE Weight Bearing:  (sternal precautions) Other Position/Activity Restrictions: sternal precautions  Therapy/Group: Individual Therapy  Curtis Sites 12/06/2020, 6:31 AM

## 2020-12-06 NOTE — Patient Care Conference (Signed)
Inpatient RehabilitationTeam Conference and Plan of Care Update Date: 12/06/2020   Time: 10:18 AM    Patient Name: Marcus Johnson.      Medical Record Number: [id number]  Date of Birth: 1961/07/11 Sex: Male         Room/Bed: 4M08C/4M08C-01 Payor Info: Payor: [employer] MEDICARE / Plan: [place] HMO / Product Type: *No Product type* /    Admit Date/Time:  11/14/2020  3:52 PM  Primary Diagnosis:  [facility] Problems: Principal Problem:   Debility    Expected Discharge Date: Expected Discharge Date: 12/09/20  Team Members Present: Physician leading conference: Dr. Alger Simons Care Coodinator Present: Loralee Pacas, LCSWA;Solara Goodchild Creig Hines, RN, BSN, [place] Nurse Present: Dorthula Nettles, RN PT Present: Apolinar Junes, PT OT Present: Laverle Hobby, OT PPS Coordinator present : Gunnar Fusi, SLP     Current Status/Progress Goal Weekly Team Focus  Bowel/Bladder   Oligurin, LBM 12/04/2020  remain continent,  provide complimetary intervention to assist with bowel movements.   Swallow/Nutrition/ Hydration             ADL's   Better couple of days, CGA transfers, supervision UB ADLs, min-mod A LB  supervision overall  d/c planning, ADLs, endurance, transfers, LVAD management   Mobility   CGA-supervision transfers and gait 100-150 ft consistently, 5+8+8+10 steps on non-dialysis days, 4 steps following dialysis limited by "wooziness" and fatigue.  Supervision-CGA overall, 5+7+7+7 steps to enter apartment after dialysis  Activity tolerance, stair training with focus on fatigue managment following dialysis, gait training, functional mobility, d/c planning, energy conservation techniques, LVAD education, patient/caregiver education   Communication             Safety/Cognition/ Behavioral Observations            Pain   Pain to buttocks and right elbow 6/10. Scheduled muscle rub, PRN oxy  pain <3/10- pain scale  Assess Qshift and PRN.   Skin   Stage III and Stage  II to sacrum.  no new skin break down, no signs of infection to existing wounds  Assess Skin qshift and PRN     Discharge Planning:  Pt to d/c to home with his son and brother. His brother [name] will be his primary caregiver since his son works 3rd shift. Pt is new dialysis and LVAD. Waiting on dialysis seat and HHA. Fam edu completed on 4/29.   Team Discussion: Nursing is to give Midodrine after patient returns from HD, has nausea associated with LVAD. Continent B/B. Stage 3 to sacrum is healing nicely. Still doesn't have a HD seat. Patient on target to meet rehab goals: yes, has had better couple of days. Supervision for upper body, min/mod assist lower body. Patient's family will be able to assist. Good week on non HD days. Family is actively trying to find ground level apartment or house to live in.   *See Care Plan and progress notes for long and short-term goals.   Revisions to Treatment Plan:  Not at this time.  Teaching Needs: Family education, medication management, LVAD management, skin/wound care, dressing changes, transfer training, gait training, balance training, stair training, endurance training, safety awareness.  Current Barriers to Discharge: Decreased caregiver support, Medical stability, Home enviroment access/layout, Wound care, Lack of/limited family support, Weight, Hemodialysis, Medication compliance and Behavior  Possible Resolutions to Barriers: Continue current medications, provide emotional support.     Medical Summary Current Status: improving nausea with medication adjustments. bp holding more with midodrine but has more problems after HD  with bp/activity tolerance  Barriers to Discharge: Medical stability   Possible Resolutions to Barriers/Weekly Focus: daily mgt of bp/meds, maintain peristalsis with meds/diet, CV mgt   Continued Need for Acute Rehabilitation Level of Care: The patient requires daily medical management by a physician with specialized  training in physical medicine and rehabilitation for the following reasons: Direction of a multidisciplinary physical rehabilitation program to maximize functional independence : Yes Medical management of patient stability for increased activity during participation in an intensive rehabilitation regime.: Yes Analysis of laboratory values and/or radiology reports with any subsequent need for medication adjustment and/or medical intervention. : Yes   I attest that I was present, lead the team conference, and concur with the assessment and plan of the team.   Cristi Loron 12/06/2020, 2:39 PM

## 2020-12-07 LAB — RENAL FUNCTION PANEL
Albumin: 2.7 g/dL — ABNORMAL LOW (ref 3.5–5.0)
Anion gap: 11 (ref 5–15)
BUN: 38 mg/dL — ABNORMAL HIGH (ref 6–20)
CO2: 27 mmol/L (ref 22–32)
Calcium: 9.2 mg/dL (ref 8.9–10.3)
Chloride: 99 mmol/L (ref 98–111)
Creatinine, Ser: 4.61 mg/dL — ABNORMAL HIGH (ref 0.61–1.24)
GFR, Estimated: 14 mL/min — ABNORMAL LOW (ref >60)
Glucose, Bld: 99 mg/dL (ref 70–99)
Phosphorus: 3.5 mg/dL (ref 2.5–4.6)
Potassium: 4.1 mmol/L (ref 3.5–5.1)
Sodium: 137 mmol/L (ref 135–145)

## 2020-12-07 LAB — GLUCOSE, CAPILLARY
Glucose-Capillary: 94 mg/dL (ref 70–99)
Glucose-Capillary: 123 mg/dL — ABNORMAL HIGH (ref 70–99)
Glucose-Capillary: 108 mg/dL — ABNORMAL HIGH (ref 70–99)
Glucose-Capillary: 146 mg/dL — ABNORMAL HIGH (ref 70–99)
Glucose-Capillary: 177 mg/dL — ABNORMAL HIGH (ref 70–99)

## 2020-12-07 LAB — PROTIME-INR
INR: 1.9 — ABNORMAL HIGH (ref 0.8–1.2)
Prothrombin Time: 21.4 seconds — ABNORMAL HIGH (ref 11.4–15.2)

## 2020-12-07 LAB — MAGNESIUM: Magnesium: 2 mg/dL (ref 1.7–2.4)

## 2020-12-07 LAB — LACTATE DEHYDROGENASE: LDH: 167 U/L (ref 98–192)

## 2020-12-07 MED ORDER — WARFARIN SODIUM 3 MG PO TABS
3.0000 mg | ORAL_TABLET | Freq: Once | ORAL | Status: AC
  Administered 2020-12-07: 3 mg via ORAL
  Filled 2020-12-07: qty 1

## 2020-12-07 NOTE — Progress Notes (Addendum)
Patient ID: Marcus Diantonio., male   DOB: [birth year], 60 y.o.   MRN: [id number]     Advanced Heart Failure Rounding Note  PCP-Cardiologist: None   Subjective:    - 2/27 Milrinone switched to dobutamine 5 mcg.  - 2/28 Moved to ICU. Dobutamine increased to 7.5 mcg and Norepi 3 mcg added. Diuretics held.  - 3/1 Impella 5.5 placed.  - 3/4 Teeth removed - 3/10 HM3 LVAD placed - 3/13 VAD speed increased to 5500. Marcus Johnson out  - 3/14 Nephrology consulted. Given 1UPRBCs  - 3/15 Started CRRT. Given 1UPRBCs.  - 3/23 VAD Speed turned down to 5300  - 3/23 CRRT and Milrinone Stopped. Back in Afib - 3/24 CRRT restarted.  - 3/27 CVVH off.  - 3/28 iHD - 3/29 iHD. Walked 75 feet!! - 3/20 Ramp Echo: Fixed speed: 5200 Low speed limit: 4900 - 4/3 S/P RIJ HD catheter.  - 4/10 60th B-day!  - 4/11 Transferred to CIR. Back on heparin drip.  - 4/26 amio stopped due to tremors. - 4/27 Erythromycin    Doing ok but feels drowsy this morning. Took a sleep aid last night. Also complaining of irritation from CT suture.   Had HD yesterday.   Completed 2 PT sessions this morning. Feels he is still making progress. Hoping to go home later this week.   INR 2.1 LDH 167  LVAD Interrogation HM 3: Speed: 5200 Flow: 4.1  PI: 5.1 Power: 3.9 1 PI event today  VAD interrogated personally. Parameters stable.   Objective:   Weight Range: 83.1 kg Body mass index is 25.92 kg/m.   Vital Signs:   Temp:  [97.8 F (36.6 C)-98.5 F (36.9 C)] 98 F (36.7 C) (05/04 0432) Pulse Rate:  [85-92] 90 (05/04 0432) Resp:  [16-19] 18 (05/04 0432) BP: (100-179)/(53-155) 100/79 (05/04 0554) SpO2:  [92 %-100 %] 100 % (05/04 0432) Weight:  [83.1 kg] 83.1 kg (05/04 0432) Last BM Date: 12/05/20  Weight change: Filed Weights   12/06/20 0437 12/07/20 0432  Weight: 83.1 kg 83.1 kg    Intake/Output:   Intake/Output Summary (Last 24 hours) at 12/07/2020 1153 Last data filed at 12/07/2020 0500 Gross per 24 hour  Intake  460 ml  Output 2050 ml  Net -1590 ml      Physical Exam   General:  Well appearing, laying in bed currently No distress  HEENT: normal  Neck: supple. No JVP. Carotids 2+ bilat; no bruits. No lymphadenopathy or thryomegaly appreciated. Cor: LVAD hum.   Lungs: clear, no wheezing  Abdomen: obese soft, nontender, non-distended. No hepatosplenomegaly. No bruits or masses. Good bowel sounds. Driveline site clean. Anchor in place. Ct sutures still present  Extremities: no cyanosis, clubbing, rash. Warm no edema  Neuro: alert & oriented x 3. No focal deficits. Moves all 4 without problem    Labs    CBC No results for input(s): WBC, NEUTROABS, HGB, HCT, MCV, PLT in the last 72 hours. Basic Metabolic Panel Recent Labs    12/06/20 0414 12/07/20 0451  NA 135 137  K 4.4 4.1  CL 99 99  CO2 25 27  GLUCOSE 131* 99  BUN 57* 38*  CREATININE 6.49* 4.61*  CALCIUM 9.3 9.2  MG 2.2 2.0  PHOS 4.1 3.5   Liver Function Tests Recent Labs    12/06/20 0414 12/07/20 0451  ALBUMIN 2.9* 2.7*   No results for input(s): LIPASE, AMYLASE in the last 72 hours. Cardiac Enzymes No results for input(s): CKTOTAL, CKMB, CKMBINDEX, TROPONINI  in the last 72 hours.  BNP: BNP (last 3 results) Recent Labs    11/17/20 0411 11/24/20 0454 12/01/20 0000  BNP 630.2* 723.8* 740.4*    ProBNP (last 3 results) No results for input(s): PROBNP in the last 8760 hours.   D-Dimer No results for input(s): DDIMER in the last 72 hours. Hemoglobin A1C No results for input(s): HGBA1C in the last 72 hours. Fasting Lipid Panel No results for input(s): CHOL, HDL, LDLCALC, TRIG, CHOLHDL, LDLDIRECT in the last 72 hours. Thyroid Function Tests No results for input(s): TSH, T4TOTAL, T3FREE, THYROIDAB in the last 72 hours.  Invalid input(s): FREET3  Other results:   Imaging    No results found.   Medications:     Scheduled Medications: . (feeding supplement) PROSource Plus  30 mL Oral BID BM  . vitamin  C  1,000 mg Oral Daily  . Chlorhexidine Gluconate Cloth  6 each Topical BID  . Chlorhexidine Gluconate Cloth  6 each Topical Q0600  . darbepoetin (ARANESP) injection - DIALYSIS  100 mcg Intravenous Q Tue-HD  . erythromycin  250 mg Oral TID AC  . feeding supplement (NEPRO CARB STEADY)  237 mL Oral BID BM  . gabapentin  100 mg Oral QHS  . insulin aspart  0-15 Units Subcutaneous TID WC  . insulin aspart  0-5 Units Subcutaneous QHS  . insulin glargine  8 Units Subcutaneous Daily  . metoCLOPramide  10 mg Oral TID AC & HS  . midodrine  10 mg Oral TID WC  . multivitamin  1 tablet Oral QHS  . Muscle Rub   Topical BID  . pantoprazole  40 mg Oral Daily  . polyethylene glycol  17 g Oral BID  . rosuvastatin  10 mg Oral Daily  . scopolamine  1 patch Transdermal Q72H  . senna-docusate  2 tablet Oral BID  . sorbitol  30 mL Oral Daily  . Warfarin - Pharmacist Dosing Inpatient   Does not apply q1600    Infusions: . sodium chloride 10 mL (11/17/20 1430)  . sodium chloride 250 mL (12/01/20 1806)    PRN Medications: sodium chloride, sodium chloride, acetaminophen, alteplase, alum & mag hydroxide-simeth, bisacodyl, dextrose, diphenhydrAMINE, guaiFENesin-dextromethorphan, heparin, lidocaine-prilocaine, ondansetron (ZOFRAN) IV, oxyCODONE, pentafluoroprop-tetrafluoroeth, prochlorperazine **OR** prochlorperazine **OR** prochlorperazine, sodium chloride flush, sodium phosphate, white petrolatum    Assessment/Plan   1. Acute on chronic systolic HF -> cardiogenic shock:  ICM. Has Medtronic ICD. EF 15%. Low output persisted despite milrinone and then dobutamine with rise in creatinine to 2.5. Impella 5.5 placed 3/1.  Creatinine improved to 1.7 and HM3 LVAD was placed on 3/10. Developed post-op renal failure requiring CVVH. Now off milrinone and epinephrine. Last echo showed moderate RV enlargement with mildly decreased RV function and VAD speed decreased to 5300 due to left-sided septum. CRRT stopped 3/23 but  restarted 3/24. CVVH stopped again on 3/27. Ramp echo 3/30 with speed decreased to 5200. Tolerating iHD for volume management.  MAPs stable on midodrine. - Continue Midodrine 10 mg tid due to dizzinness/orthostasis (resolved for the most part).    - Continue compression stockings.   - HD for volume management  2. LVAD: 3/13 VAD speed increased to 5500. 3/23 speed decreased to 5300 and decreased to 5200 on 3/30. VAD interrogated personally. Parameters stable. LDH 167, INR 1.9 today  - On warfarin, on ASA 81 daily.  - DL site ok 3. CAD s/p CABG 2002:  Last cath in 6/19 with patent grafts. No s/s angina- Continue Crestor 40 mg  daily.  4. AKI on CKD Stage 3b: Suspect that this is a combination of cardiorenal syndrome and diabetic nephropathy and possibly ATN. Developed post-op AKI. Started on CRRT 3/15. CVVH stopped 3/23 but restarted 3/24. Now getting iHD, looks like he will be HD-dependent.  Has tunneled catheter.  - HD T/Th/S - Renal and VVS discussing, but with continuous flow LVAD, think AV fistula would be less likely to mature.  Patient wants to wait on getting AV graft until some point after discharge. No change - Renal Navigator following for clip. Says he now has a seat. 5. Atrial flutter/fibrillation: s/p DC-CV 08/21/18.  - 4/26 Off amio.  6. Wound R Foot:  Resolved.  7. Right arm pain: Likely neuropathic from stretch of brachial plexus with Impella placement.  - Continue gabapentin. Able to move right hand a little better.  8. ID:  WBCs normal.  9. DM: History of very poor control but hgbA1c trending down. Most recent 7.7%.  Insulin drip stopped 3/24. Blood glucose controlled on reduced Lantus and  sliding scale insulin to moderate.   - Diabetes coordinators have seen 10. Anemia: Stable CBC. hGB 9.2 on 4/30 11. Deconditioning - Continue to work with CIR. Appreciate their assistance  12. Unstageable Pressure Ulcer on buttocks: Much improved 100% granulation.  - Hydrotherapy completed -  Continue repositioning 13. NSVT: Stopped amio with tremors.  14. Dizziness: Still with symptoms that seem like BPPV at times. Has had Epley maneuvers.  15. Suspected Gastroparesis. Nausea resolved on Reglan and erythromycin.   He is complaining of irritation/itching from remaining CT sutures. Have discussed w/ LVAD nurses. They will clip.   Per CIR, anticipate d/c to home on 5/6. Nephrology working to secure outpatient HD seat.    Lyda Jester PA-C 12/07/2020  Patient seen with PA, agree with the above note.   Had some dizziness this morning, thinks related to his sleeping pill.  No nausea/vomiting, this is much better.   Continues to work with PT, hopefully home Friday. LVAD parameters stable.   Loralie Champagne 12/07/2020 12:26 PM

## 2020-12-07 NOTE — Progress Notes (Signed)
PROGRESS NOTE   Subjective/Complaints: Woke up feeling dizzy this morning, (room spinning). Had a good day with PT yesterday. Improved tolerance of stairs.   ROS: Patient denies fever, rash, sore throat, blurred vision,  vomiting, diarrhea, cough, shortness of breath or chest pain, joint or back pain, headache, or mood change.    Objective:   No results found. No results for input(s): WBC, HGB, HCT, PLT in the last 72 hours. Recent Labs    12/06/20 0414 12/07/20 0451  NA 135 137  K 4.4 4.1  CL 99 99  CO2 25 27  GLUCOSE 131* 99  BUN 57* 38*  CREATININE 6.49* 4.61*  CALCIUM 9.3 9.2    Intake/Output Summary (Last 24 hours) at 12/07/2020 0831 Last data filed at 12/07/2020 0500 Gross per 24 hour  Intake 460 ml  Output 2050 ml  Net -1590 ml     Pressure Injury 10/16/20 Coccyx Mid Stage 3 -  Full thickness tissue loss. Subcutaneous fat may be visible but bone, tendon or muscle are NOT exposed. Blister that has drained, 2 cm X 4.5 cm (Active)  10/16/20 0800  Location: Coccyx  Location Orientation: Mid  Staging: Stage 3 -  Full thickness tissue loss. Subcutaneous fat may be visible but bone, tendon or muscle are NOT exposed. (as per WOC note on 11/14/20)  Wound Description (Comments): Blister that has drained, 2 cm X 4.5 cm  Present on Admission: No     Pressure Injury 10/16/20 Coccyx Right Stage 2 -  Partial thickness loss of dermis presenting as a shallow open injury with a red, pink wound bed without slough. blister that has drained, 0.5 cm X 0.5 cm (Active)  10/16/20 0800  Location: Coccyx  Location Orientation: Right  Staging: Stage 2 -  Partial thickness loss of dermis presenting as a shallow open injury with a red, pink wound bed without slough.  Wound Description (Comments): blister that has drained, 0.5 cm X 0.5 cm  Present on Admission: No    Physical Exam: Vital Signs Blood pressure 100/79, pulse 90,  temperature 98 F (36.7 C), resp. rate 18, height 5' 10.5" (1.791 m), weight 83.1 kg, SpO2 100 %. Constitutional: No distress . Vital signs reviewed. HEENT: EOMI, oral membranes moist Neck: supple Cardiovascular: RRR without murmur. No JVD    Respiratory/Chest: CTA Bilaterally without wheezes or rales. Normal effort    GI/Abdomen: BS +, non-tender, non-distended Ext: no clubbing, cyanosis, or edema Psych: pleasant and cooperative, sl anxious Skin:  cath right shoulder in place.LVAD site dry, sacral wound with pink granulation tissue.   Marland Kitchen  Neuro: Alert and oriented x 3. Normal insight and awareness. STM deficits. Normal language and speech. Cranial nerve exam unremarkable  Sensory exam is normal. Reflexes are 2+ in all 4's. Fine motor coordination is intact. R>L UE tremors mild today. Motor function is grossly 4/5 with tremor. RUE  4-/5.  LE motor 4/5 with more weakness proximally--motor stable. No nystagmus seen.  Musculoskeletal:  RUE moving more freely   Assessment/Plan: 1. Functional deficits which require 3+ hours per day of interdisciplinary therapy in a comprehensive inpatient rehab setting.  Physiatrist is providing close team supervision and 24 hour  management of active medical problems listed below.  Physiatrist and rehab team continue to assess barriers to discharge/monitor patient progress toward functional and medical goals  Care Tool:  Bathing  Bathing activity did not occur:  (time constraints) Body parts bathed by patient: Left arm,Abdomen,Chest,Front perineal area,Right upper leg,Left upper leg,Face,Right arm,Buttocks   Body parts bathed by helper: Right lower leg,Left lower leg     Bathing assist Assist Level: Moderate Assistance - Patient 50 - 74%     Upper Body Dressing/Undressing Upper body dressing Upper body dressing/undressing activity did not occur (including orthotics): Environmental limitations (PICC running) What is the patient wearing?: Pull over shirt     Upper body assist Assist Level: Minimal Assistance - Patient > 75%    Lower Body Dressing/Undressing Lower body dressing      What is the patient wearing?: Pants     Lower body assist Assist for lower body dressing: Moderate Assistance - Patient 50 - 74%     Toileting Toileting Toileting Activity did not occur Landscape architect and hygiene only): N/A (no void or bm)  Toileting assist Assist for toileting: Dependent - Patient 0%     Transfers Chair/bed transfer  Transfers assist  Chair/bed transfer activity did not occur: Safety/medical concerns  Chair/bed transfer assist level: Supervision/Verbal cueing Chair/bed transfer assistive device: Programmer, multimedia   Ambulation assist   Ambulation activity did not occur: Safety/medical concerns  Assist level: Contact Guard/Touching assist Assistive device: Walker-rolling Max distance: >100 ft   Walk 10 feet activity   Assist  Walk 10 feet activity did not occur: Safety/medical concerns  Assist level: Contact Guard/Touching assist Assistive device: Walker-rolling   Walk 50 feet activity   Assist Walk 50 feet with 2 turns activity did not occur: Safety/medical concerns  Assist level: Contact Guard/Touching assist Assistive device: Walker-rolling    Walk 150 feet activity   Assist Walk 150 feet activity did not occur: Safety/medical concerns  Assist level: Contact Guard/Touching assist Assistive device: Walker-rolling    Walk 10 feet on uneven surface  activity   Assist Walk 10 feet on uneven surfaces activity did not occur: Safety/medical concerns         Wheelchair     Assist Will patient use wheelchair at discharge?: No (Per PT long term goals)   Wheelchair activity did not occur: Safety/medical concerns         Wheelchair 50 feet with 2 turns activity    Assist    Wheelchair 50 feet with 2 turns activity did not occur: Safety/medical concerns        Wheelchair 150 feet activity     Assist  Wheelchair 150 feet activity did not occur: Safety/medical concerns       Blood pressure 100/79, pulse 90, temperature 98 F (36.7 C), resp. rate 18, height 5' 10.5" (1.791 m), weight 83.1 kg, SpO2 100 %.  Medical Problem List and Plan: 1.Functional and mobility deficitssecondary to debility after acute on chronic CHF with subsequent LVAD placement -patient maynotshower -ELOS/Goals: 5/2  -Continue CIR therapies including PT, OT    -pt with STM deficits  -scopolamine patch for ?vestibular symptoms-will continue given vestibular sx today  -HD on AM schedule so that we can work on stairs afterwards   -needs midodrine immediately after HD   -did well 5/3 2. Chronic systolic CHF s/p LVAD 3:  -DVT/anticoagulation:Pharmaceutical:Continue Coumadin per pharmacy -antiplatelet therapy: ASA 3.Right arm neuropathic pain/brachial plexus injury/Pain Management:Tylenol or oxycodone prn.  -- gabapentin 200 mgat bedtime  with some benefit -sling prn for RUE support and comfort  -expect recovery of motor/sensory function in RUE  5/3 well controlled, continue current regimen.  4. Mood:LCSW to follow for evaluation and support. -antipsychotic agents: N/A 5. Neuropsych: This patientiscapable of making decisions on hisown behalf. 6. Skin/Wound Care:Optimize nutritional status--continue Nephro and prosource.  --Sacral decubitus: Air mattress.    -appears much improved-->change to hydrogel/gauze daily   -pt is NO LONGER RECEIVING HYDRO --Right foot wound resolved.  7. Fluids/Electrolytes/Nutrition:Strict I/O. Continue to monitor labs with HD? 8. Chronic systolic CHF s/p LVAD 3:  --Cardiology to follow and manage cardiac issues.  -5/4 weights stable Filed Weights   12/06/20 0437 12/07/20 0432  Weight: 83.1 kg 83.1  kg    bp/volume mgt per nephro and cards.    -off revatio   -TEDS, acclimation   - continue midodrine 10mg  tid (give dose immediately after HD to prop up bp for stairs/activity) 9. Acute on chronic anemia: Continue to monitor H/H/ monitor for signs of bleeding.  --Was transfused with multiple units PRBC. --ON Nulecit Tu/Th/Sa  -transfuse as needed in HD hgb holding at 8.6 10. T2DM: Hgb A1c- monitor BS ac/hs.BS reasonable on current intake --continue Lantus in am with SSI for elevated BS.    5/2   improved after lantus decreased to 8u qam     CBG (last 3)  Recent Labs    12/06/20 2048 12/07/20 0605 12/07/20 0745  GLUCAP 190* 94 123*    5/4 bump lantus to 10u qam to cover PM better 11. ESRD: Hyperkalemia being managed with lokelma/HD. -see above 12. Mild leucocytosis: Resolved  13. A fib: NSR, HR remains controlled on Amiodarone, continue 14. Nausea: likely d/t to peristalsis/gastroparesis---improved  -keep bowels moving---+bm 5/1  -continue reglan 10mg  AC/HS   -amiodarone reduced per cards   -needs bm 5/4, sorbitol, dulcolax supp    LOS: 23 days A FACE TO FACE EVALUATION WAS PERFORMED  Meredith Staggers 12/07/2020, 8:31 AM

## 2020-12-07 NOTE — Progress Notes (Signed)
LVAD Coordinator Rounding Note:  HM III LVAD implanted on 10/13/20 by Dr Cyndia Bent under Destination Therapy criteria.  Discharged from [facility] and admitted to [facility] 11/14/20.  Pt awake, sitting in wheelchair at bedside eating breakfast. Denies complaints. Reports he may be going home this Friday, 12/09/20 since he now has spot at [facility].   Patient reports his son has been unable to find first floor apartment, but thinks he "can manage" three flights of stairs at home. Reminded him of 3x week dialysis treatments, trips to [facility] and that his son works during day. He says his brother will be helping him navigate stairs and that "he is strong enough".  Vital signs: Temp: 98 HR: 90 Doppler: no documented Auto cuff: 106/84 (95) O2 Sat: 98% on RA Wt: 183.6>190.9..........172.8>170.8>169.7>182.7>183.3>183.2 lbs    LVAD interrogation reveals:  Speed: 5200 Flow: 4.4 Power: 3.8w PI: 3.9 Hematocrit: 30  Alarms: none Events: none   Fixed speed: 5200 Low speed limit: 4900  Drive Line:  Existing VAD dressing clean, dry, intact. Anchor correctly applied. Continuetwice weekly dressing changes.Next dressing change will be due 12/08/20. No family available for dressing change.  Labs:  LDH trend: 199>191>196.............459>977>414>239>532>023>343>568  INR trend: 1.5>1.7>1.9>2.4>3.2>2.9>3.2>2.9>2.2>2.1>2.3>1.9>1.8>1.9>2.1>1.9  Anticoagulation Plan: -INR Goal: 2.0-2.5 -ASA Dose: stopped 30 days post op per protocol  Device:  -  Medtronic single -  Pacing: VVI 40 - Therapies: on 200 bpm  Plan/Recommendations:  1. Call VAD coordinator for equipment or drive line issues 2. Twice weekly dressing changes while using acell. Next dressing due 12/08/20.   Zada Girt RN [place] Coordinator  Office: [phone]  24/7 Pager: [phone]

## 2020-12-07 NOTE — Progress Notes (Signed)
ANTICOAGULATION CONSULT NOTE - Follow Up Consult  Pharmacy Consult for Warfarin Indication: LVAD  Allergies  Allergen Reactions  . Meclizine Hcl Anaphylaxis and Swelling  . Ivabradine Nausea Only    Patient Measurements: Height: 5' 10.5" (179.1 cm) Weight: 83.1 kg (183 lb 3.2 oz) IBW/kg (Calculated) : 74.15 kg  Vital Signs: Temp: 98 F (36.7 C) (05/04 0432) BP: 100/79 (05/04 0554) Pulse Rate: 90 (05/04 0432)  Labs: Recent Labs    12/05/20 0401 12/06/20 0414 12/07/20 0451  LABPROT 22.0* 23.3* 21.4*  INR 1.9* 2.1* 1.9*  CREATININE 5.55* 6.49* 4.61*    Estimated Creatinine Clearance: 17.9 mL/min (A) (by C-G formula based on SCr of 4.61 mg/dL (H)).   Assessment: 60 yo male s/p new LVAD implant with HeartMate III on 3/10. Warfarin started per MD on 3/12, and pharmacy has now been consulted to take over warfarin dosing.   INR slightly subtherapeutic at 1.9, LDH stable.  Goal of Therapy: INR 2-2.5 Monitor platelets by anticoagulation protocol: Yes   Plan:  Warfarin 3mg  today Daily PT/INR  Monitor for signs and symptoms of bleeding  Arrie Senate, PharmD, BCPS, [facility] Clinical Pharmacist [phone] Please check AMION for all [place] numbers 12/07/2020

## 2020-12-07 NOTE — Progress Notes (Signed)
Physical Therapy Weekly Progress Note  Patient Details  Name: Marcus Johnson. MRN: [id number] Date of Birth: Oct 25, 1960  Beginning of progress report period: November 29, 2020 End of progress report period: Dec 07, 2020  Today's Date: 12/07/2020 PT Individual Time: 1400-1505 PT Individual Time Calculation (min): 65 min   Patient has made good progress, with continued fluctuations in performance, this week with improved activity tolerance with stair training following dialysis on 5/3 performing 28 steps with 4 seated rest breaks. However, had poor performance today, 5/4, with need for total A to sit at top of stairs after ascending 8 steps. He is becoming more consistent with functional mobility at CGA-supervision with RW and ambulating 100-150 feet consistently. Extended patient's stay due to patient needing to access his third floor apartment (26 steps with 3 landings for rest breaks) following dialysis and patient demonstrating increased fatigue and decreased activity tolerance with stair training trials following dialysis with significant safety concerns. Will continue to focus efforts of improving activity tolerance with stair training and family education for safe home entry at d/c.  Patient continues to demonstrate the following deficits muscle weakness and muscle joint tightness, decreased cardiorespiratoy endurance, unbalanced muscle activation and decreased coordination, decreased visual acuity, decreased problem solving and decreased memory, central origin and decreased standing balance, decreased postural control and decreased balance strategies and therefore will continue to benefit from skilled PT intervention to increase functional independence with mobility.  Patient progressing toward long term goals.  Continue plan of care.  PT Short Term Goals Week 3:  PT Short Term Goal 1 (Week 3): STG=LTG due to ELOS. Week 4:  PT Short Term Goal 1 (Week 4): STG=LTG due to ELOS.  Skilled  Therapeutic Interventions/Progress Updates:     Session 1: Patient in w/c on LVAD monitor in the room with RN providing medications upon PT arrival. Patient alert and agreeable to PT session. Patient denied pain during session. OT reported that patient had a poor performance this morning, requiring max-total A due to lower extremity buckling. Discussed with patient, who confirmed that his knees buckled and "it was rough." Asked about symptoms during buckling and he reported weakness and the room spinning. He also reported that the room was spinning "8x" last night keeping him awake. He suspects that "the sleeping pill" referring to Trazodone upon chart review. Switched patient to battery power and loaded extra batteries into his bag during discussion.  Patient with multiple bouts of "dizziness," mild SOB, and fatigue with mild knee buckling throughout session. MAP 98 after activity and 94 at rest taken with Doppler, RN and MD made aware. Mobility limited today by symptoms above.  Therapeutic Activity: Bed Mobility: Patient performed sit to supine with mod I using bed rail.  Transfers: Patient performed sit to/from stand x4 with CGA. Provided verbal cues for forward weight shift.  Gait Training:  Patient ambulated 40-50 feet x3 using RW with CGA and intermittent min A due to B knee buckling/shaking with reports of 2-4/10 dizziness stating "the room is spinning". Ambulated with narrow BOS, increased R lean, and decreased gait speed, step length, and step height. Provided verbal cues for paced breathing, resetting after knee buckling.  Patient in bed, returned to LVAD monitor at end of session with breaks locked and all needs within reach.   Session 2: Patient in bed on LVAD monitor upon PT arrival. Patient alert and agreeable to PT session. Patient denied pain during session. Switched patient to battery packs with total A at  beginning of session.   Noted patient's abdomen appeared swollen in lying,  firm upon palpation. Patient reported need to have BM at beginning of session, patient was able to have BM without significant change in abdominal swelling. Patient continued to have B lower extremity weakness, shaking, and knee buckling requiring max-total a x2 due to fatigue.   Reviewed patient's vitals and labs from his chart with the patient. Noted in [place], patient with 14 lb increase in weight from 4/30 at 169 lb, to 5/3 at 183 lb, and remained stable today at 183 lbs. Patient aware that this is something he should be looking out for and reporting to his doctors. Dr. Naaman Plummer, MD, Jeannene Patella, PA, and nurse case manager, Erline Levine, made aware via secure chat.   Therapeutic Activity: Bed Mobility: Patient performed supine to/from sit with mod I with use of bed rail.  Transfers: Patient performed sit to/from stand x3 with CGA for safety using RW. He performed an ambulatory toilet transfer with CGA for ambulation into the bathroom, patient with sudden B lower extremity shaking, strong posterior lean, and B knee buckling when entering the bathroom, required max A to remain standing to recover to take last steps to the toilet with mod A. Patient was continent of bowl and bladder during toileting, required total A for peri-care and lower body dressing. Required mod A to stand from the toilet and CGA to pivot to the w/c.   Stair Training:  Patient performed sit to/from stands x5 as cardio warm up for stair training with CGA for safety without AD.  Patient ascended/descended 4 steps and 8 steps using B upper extremity support on L rail with CGA with min a at end of first 4 steps due to mild lower extremity shaking. During second trial, patient with B knee buckling at top of steps resulting in Total A with patient seated on therapist's knee to prevent patient from falling. A second therapist provided an adapted shower chair set-up for steps for patient to rest on, patient transitioned to shower chair with mod A.  Patient provided seated rest break until symptoms/fatigue resolved, ~2 min. Then returned to the bottom of the steps with min A-CGA and verbal cuing for sequencing.  Wheelchair Mobility:  Patient was transported in the w/c with total A throughout session for energy conservation and time management.  Discussed concerns about inconsistent performance on the stairs with patient, who shares the same concerns. Reports no new lead on alternative housing, however his family is still pursuing options. Educated on need for +2 assist and benefit of shower chair for rest breaks if her is unable to complete one of the flights and calling emergency services if his symptoms limit him from being able to continue to the top of the stairs to prevent falls. Patient in agreement.   Patient in bed, returned to LVAD monitor, at end of session with breaks locked, 4 rails up per patient request, and all needs within reach.   Therapy Documentation Precautions:  Precautions Precautions: Fall,Sternal,Other (comment) Precaution Comments: LVAD, sacral wound, Left BPPV Required Braces or Orthoses: Other Brace Other Brace: thumb loop and buddy strap R hand Restrictions Weight Bearing Restrictions: No RUE Weight Bearing:  (sternal precautions) Other Position/Activity Restrictions: sternal precautions  Therapy/Group: Individual Therapy  Saralyn Willison L Zeke Aker PT, DPT  12/07/2020, 3:42 PM

## 2020-12-07 NOTE — Progress Notes (Signed)
Patient ID: Marcus Chap., male   DOB: Apr 18, 1961, 60 y.o.   MRN: [id number]  SW received updates from [facility] about to accept HHPT/OT/SN referral.  SW met with pt in room to provide above updates. Pt understands his discharge is pending if he is able to get a dialysis seat. Pt did disclose they were actively looking for a home that was one level.   SW left message for pt son [name] [phone]) to provide updates on HHA and will update if able to get a dialysis seat.   Loralee Pacas, MSW, [place] Office: [phone] Cell: [phone] Fax: [phone]

## 2020-12-07 NOTE — Progress Notes (Signed)
Renal Navigator has not heard back from [place] at [facility] (message sent yesterday morning) in the event that she is out of office and someone else needs to be reached. Navigator asked [facility] for an update on outpatient HD seat schedule as patient's targeted discharge date is fast approaching.  Navigator will continue to follow closely.   Alphonzo Cruise, [place] Renal Navigator [phone]

## 2020-12-07 NOTE — Progress Notes (Signed)
Occupational Therapy Session Note  Patient Details  Name: Marcus Johnson. MRN: [id number] Date of Birth: 1961-05-06  Today's Date: 12/07/2020 OT Individual Time: 9767-3419 OT Individual Time Calculation (min): 55 min    Short Term Goals: Week 3:  OT Short Term Goal 1 (Week 3): Continue working toward established LTG  Skilled Therapeutic Interventions/Progress Updates:    Pt supine, reporting dizziness (room spinning) 7/10, no c/o pain. Feet washed and lotion applied to maintain foot hygiene/skin care. Encouraged pt to f/u with a podiatrist. Pt completed bed mobility with min A to EOB. He required min A and increased time to stabilize statically sitting EOB. Pt still reporting 7/10 dizziness. MAP checked EOB- 72. Sit > stand with min A and pt reporting he feels well enough to ambulate to the sink. 5 steps in pt had B knee buckling (myoclonic jerking) and required MAX A recovery from OT. Very unsafe 4 ft of mobility to make it to the w/c with max A from OT and mod-max cueing as pt became anxious. Pt sat at the sink and completed oral care and UB bathing with set up assist. He did require min cueing for LVAD management during UB dressing. Pt donned shirt with min assist, requiring mod cueing for sequencing orientation of shirt. Min A to apply deodorant. Pt stood with CGA for peri hygiene. He washed anterior and posterior peri areas with min A for thoroughness. Pt completed LB dressing with max A despite increased time and cueing for problem solving reaching down to feet. Deficit in LB dressing a combination in forward flexion limitation and in sequencing. Pt was left sitting up in the w/c with all needs met.   Therapy Documentation Precautions:  Precautions Precautions: Fall,Sternal,Other (comment) Precaution Comments: LVAD, sacral wound, Left BPPV Required Braces or Orthoses: Other Brace Other Brace: thumb loop and buddy strap R hand Restrictions Weight Bearing Restrictions: No RUE  Weight Bearing:  (sternal precautions) Other Position/Activity Restrictions: sternal precautions   Therapy/Group: Individual Therapy  Curtis Sites 12/07/2020, 6:27 AM

## 2020-12-07 NOTE — Progress Notes (Signed)
Inpatient Rehabilitation Care Coordinator Discharge Note  The overall goal for the admission was met for:   Discharge location: Yes. D/c to home with 24/7 care from family.   Length of Stay: Yes. 24 days.   Discharge activity level: Yes. Supervision.  Home/community participation: Yes.Limited  Services provided included: MD, RD, PT, OT, SLP, RN, CM, TR, Pharmacy, Neuropsych and SW  Financial Services: Private Insurance: [employer]  Choices offered to/list presented to:Yes  Follow-up services arranged: Home Health: [facility] for HHPT/OT/SN and DME: [place] for RW and transport chair   Comments (or additional information):  Patient/Family verbalized understanding of follow-up arrangements: Yes  Individual responsible for coordination of the follow-up plan: contact pt [phone] or pt son [name] [phone]  Confirmed correct DME delivered: Rana Snare 12/07/2020    Mekala Winger A Laasya Peyton

## 2020-12-07 NOTE — Progress Notes (Signed)
[facility] NEPHROLOGY PROGRESS NOTE  Assessment/ Plan: Pt is a 60 y.o. yo male withsystolic CHF with EF of 07%, has ICD and LVAD, developed AKI and is now HD depdnent    Acute kidney injury on CKD due to cardiorenal syndrome. Initially required CRRT and then transition to intermittent hemodialysis since 3/28. Given prolonged HD dependence, is ESRD.   Status post TDC placed by IR on 11/07/2020.  Renal navigator is following for outpatient HD center arrangement, currently doing inpatient rehab.  Cont HD on THS Schedule currently: 2K, 3.5h, 2L UF, no heparin  # Vascular access: vein mapping was done on 4/1 and was seen by [facility]. At that time the plan was follow outpatient for permanent access. He is now on coumadin and LVAD. Discussed with AHF. AVF probably won't mature therefore he will need AVG. I have discussed perm access with the patient and his son over the phone. He has numbness in one hand and worries about the complication of surgery, failure, losing another hand etc. He does not want  perm access at this time.  Will f/u with VVS as outpatient.   #Acute on chronic systolic heart failure with cardiogenic shock: Has ICD and LVAD. Volume management by dialysis. Cardiology is following. Also on midodrine.    #Anemia of CKD and critical illness: Hemoglobin is stable. Continue Aranesp.    #CKD-MBD: Calcium and phosphorus level acceptable. Monitor lab.   Subjective:   No new c/o.  Tol HD yesterday  Objective Vital signs in last 24 hours: Vitals:   12/07/20 0432 12/07/20 0554 12/07/20 1200 12/07/20 1417  BP: 108/89 100/79 92/81 112/65  Pulse: 90   78  Resp: 18   18  Temp: 98 F (36.7 C)   97.7 F (36.5 C)  TempSrc:      SpO2: 100%   92%  Weight: 83.1 kg     Height:       Weight change: 0 kg  Intake/Output Summary (Last 24 hours) at 12/07/2020 1628 Last data filed at 12/07/2020 0500 Gross per 24 hour  Intake 240 ml  Output 50 ml   Net 190 ml       Labs: Basic Metabolic Panel: Recent Labs  Lab 12/05/20 0401 12/06/20 0414 12/07/20 0451  NA 136 135 137  K 4.3 4.4 4.1  CL 99 99 99  CO2 27 25 27   GLUCOSE 126* 131* 99  BUN 39* 57* 38*  CREATININE 5.55* 6.49* 4.61*  CALCIUM 9.3 9.3 9.2  PHOS 4.0 4.1 3.5   Liver Function Tests: Recent Labs  Lab 12/05/20 0401 12/06/20 0414 12/07/20 0451  ALBUMIN 2.7* 2.9* 2.7*   No results for input(s): LIPASE, AMYLASE in the last 168 hours. No results for input(s): AMMONIA in the last 168 hours. CBC: Recent Labs  Lab 12/03/20 0317  WBC 8.6  HGB 9.2*  HCT 31.2*  MCV 89.4  PLT 208   Cardiac Enzymes: No results for input(s): CKTOTAL, CKMB, CKMBINDEX, TROPONINI in the last 168 hours. CBG: Recent Labs  Lab 12/06/20 2048 12/07/20 0605 12/07/20 0745 12/07/20 1148 12/07/20 1623  GLUCAP 190* 94 123* 108* 146*    Iron Studies: No results for input(s): IRON, TIBC, TRANSFERRIN, FERRITIN in the last 72 hours. Studies/Results: No results found.  Medications: Infusions: . sodium chloride 10 mL (11/17/20 1430)  . sodium chloride 250 mL (12/01/20 1806)    Scheduled Medications: . (feeding supplement) PROSource Plus  30 mL Oral BID BM  . vitamin C  1,000 mg Oral  Daily  . Chlorhexidine Gluconate Cloth  6 each Topical BID  . Chlorhexidine Gluconate Cloth  6 each Topical Q0600  . darbepoetin (ARANESP) injection - DIALYSIS  100 mcg Intravenous Q Tue-HD  . erythromycin  250 mg Oral TID AC  . feeding supplement (NEPRO CARB STEADY)  237 mL Oral BID BM  . gabapentin  100 mg Oral QHS  . insulin aspart  0-15 Units Subcutaneous TID WC  . insulin aspart  0-5 Units Subcutaneous QHS  . insulin glargine  8 Units Subcutaneous Daily  . metoCLOPramide  10 mg Oral TID AC & HS  . midodrine  10 mg Oral TID WC  . multivitamin  1 tablet Oral QHS  . Muscle Rub   Topical BID  . pantoprazole  40 mg Oral Daily  . polyethylene glycol  17 g Oral BID  . rosuvastatin  10 mg Oral  Daily  . scopolamine  1 patch Transdermal Q72H  . senna-docusate  2 tablet Oral BID  . sorbitol  30 mL Oral Daily  . warfarin  3 mg Oral ONCE-1600  . Warfarin - Pharmacist Dosing Inpatient   Does not apply q1600    have reviewed scheduled and prn medications.  Physical Exam: General:NAD, comfortable Heart: Mechanical heart sound Lungs:clear b/l, no crackle Abdomen:soft, Non-tender, non-distended Extremities:No edema Dialysis Access: Right IJ TDC in place  Toshia Larkin B Khamron Gellert 12/07/2020,4:28 PM  LOS: 23 days

## 2020-12-08 LAB — RENAL FUNCTION PANEL
Albumin: 2.8 g/dL — ABNORMAL LOW (ref 3.5–5.0)
Anion gap: 12 (ref 5–15)
BUN: 49 mg/dL — ABNORMAL HIGH (ref 6–20)
CO2: 26 mmol/L (ref 22–32)
Calcium: 9.5 mg/dL (ref 8.9–10.3)
Chloride: 98 mmol/L (ref 98–111)
Creatinine, Ser: 5.56 mg/dL — ABNORMAL HIGH (ref 0.61–1.24)
GFR, Estimated: 11 mL/min — ABNORMAL LOW (ref >60)
Glucose, Bld: 115 mg/dL — ABNORMAL HIGH (ref 70–99)
Phosphorus: 4.1 mg/dL (ref 2.5–4.6)
Potassium: 4.2 mmol/L (ref 3.5–5.1)
Sodium: 136 mmol/L (ref 135–145)

## 2020-12-08 LAB — GLUCOSE, CAPILLARY
Glucose-Capillary: 102 mg/dL — ABNORMAL HIGH (ref 70–99)
Glucose-Capillary: 110 mg/dL — ABNORMAL HIGH (ref 70–99)
Glucose-Capillary: 164 mg/dL — ABNORMAL HIGH (ref 70–99)
Glucose-Capillary: 145 mg/dL — ABNORMAL HIGH (ref 70–99)

## 2020-12-08 LAB — CBC
HCT: 30 % — ABNORMAL LOW (ref 39.0–52.0)
Hemoglobin: 8.9 g/dL — ABNORMAL LOW (ref 13.0–17.0)
MCH: 26.2 pg (ref 26.0–34.0)
MCHC: 29.7 g/dL — ABNORMAL LOW (ref 30.0–36.0)
MCV: 88.2 fL (ref 80.0–100.0)
Platelets: 194 10*3/uL (ref 150–400)
RBC: 3.4 MIL/uL — ABNORMAL LOW (ref 4.22–5.81)
RDW: 17.2 % — ABNORMAL HIGH (ref 11.5–15.5)
WBC: 8.1 10*3/uL (ref 4.0–10.5)
nRBC: 0 % (ref 0.0–0.2)

## 2020-12-08 LAB — LACTATE DEHYDROGENASE: LDH: 180 U/L (ref 98–192)

## 2020-12-08 LAB — PROTIME-INR
INR: 1.9 — ABNORMAL HIGH (ref 0.8–1.2)
Prothrombin Time: 21.8 seconds — ABNORMAL HIGH (ref 11.4–15.2)

## 2020-12-08 LAB — BRAIN NATRIURETIC PEPTIDE: B Natriuretic Peptide: 777.5 pg/mL — ABNORMAL HIGH (ref 0.0–100.0)

## 2020-12-08 LAB — MAGNESIUM: Magnesium: 2.2 mg/dL (ref 1.7–2.4)

## 2020-12-08 MED ORDER — WARFARIN SODIUM 3 MG PO TABS
3.0000 mg | ORAL_TABLET | Freq: Once | ORAL | Status: AC
  Administered 2020-12-08: 3 mg via ORAL
  Filled 2020-12-08: qty 1

## 2020-12-08 MED ORDER — HEPARIN SODIUM (PORCINE) 1000 UNIT/ML IJ SOLN
INTRAMUSCULAR | Status: AC
  Administered 2020-12-08: 1000 [IU]
  Filled 2020-12-08: qty 4

## 2020-12-08 NOTE — Progress Notes (Signed)
Pt will now d/c on Saturday 5/7 per pt and Renal Navigator ([address], [place])  [place], [place]

## 2020-12-08 NOTE — Progress Notes (Signed)
Renal Navigator received call from Clinic Manager/J. Juleen China at [facility] who has provided a seat schedule to patient of MWF 12:20pm. He needs to arrive to his appointments at 12:00pm. She reports that [employer] has also given final approval for patient to start in the outpatient clinic. Renal Navigator spoke with Nephrologist/Dr. Joelyn Oms to discuss getting patient on outpatient HD schedule, since he has currently been on TTS in the hospital. He will plan to order another tx tomorrow for 3 hours. Navigator has discussed with CIR staff who agree for discharge either after HD tomorrow or Saturday. Navigator feels discharge after HD may be stressful for patient and appreciate CIR staff for this.  Navigator called patient's son/Cody to discuss and inform of outpatient HD plan. He states no barriers to support patient for discharge either Friday or Saturday and states that he can take his father to HD on Monday. He states he has been given all information on how to arrange Access [place] as needed from CIR CSW/A. Barbra Sarks. He is very appreciative and states no questions at this time. Navigator met with patient at HD bedside who is also very appreciative of the support and assistance from all staff give to him throughout this hospitalization and CIR stay. He is agreeable to plan and when discharge was discussed, he thinks discharge Saturday morning would be best for him. Navigator states he can also discuss this with his CIR team.  We will plan for him to start in the clinic on Monday, 12/12/20. He needs to get to the clinic at 11:00am on his first day in order to complete intake paperwork prior to his first treatment.  Navigator provided patient with written information regarding clinic information and transition to outpatient HD plan in addition to verbally telling him and his son. Navigator will ask Renal PA to send outpatient orders to clinic tomorrow after patient's last inpt  HD. Navigator remains available for questions and support.   Alphonzo Cruise, [place] Renal Navigator [phone]

## 2020-12-08 NOTE — Progress Notes (Signed)
Physical Therapy Session Note  Patient Details  Name: Marcus Johnson. MRN: [id number] Date of Birth: [birth year]  Today's Date: 12/08/2020 PT Individual Time: 1330-1410 PT Individual Time Calculation (min): 40 min   Short Term Goals: Week 4:  PT Short Term Goal 1 (Week 4): STG=LTG due to ELOS.  Skilled Therapeutic Interventions/Progress Updates:    Pt received seated EOB in room having just returned from dialysis and received medication from RN. Pt finishing eating his lunch. Assisted pt with donning knee-high TEDs and shoes. Assisted pt with transferring LVAD from wall power to battery power. Sit to stand with CGA to RW. Stand pivot transfer to w/c with RW and CGA. Ambulation x 30 ft with RW and CGA before pt reports onset of fatigue and needing to sit, also required 2 standing rest breaks during this short distance ambulation. Ascend/descend 4 x 6" stairs with L handrail and min A for balance, step-to gait pattern. As pt is descending stairs he reports feeling very dizzy and weak with BLE shaking/buckling on patient, assisted to sit down safely in chair. MAP assessed to be 90. Pt reports improvement in symptoms while seated in w/c. Handed pt off to OT for next therapy session.  Therapy Documentation Precautions:  Precautions Precautions: Fall,Sternal,Other (comment) Precaution Comments: LVAD, sacral wound, Left BPPV Required Braces or Orthoses: Other Brace Other Brace: thumb loop and buddy strap R hand Restrictions Weight Bearing Restrictions: No RUE Weight Bearing:  (sternal precautions) Other Position/Activity Restrictions: sternal precautions   Therapy/Group: Individual Therapy   Excell Seltzer, PT, DPT, CSRS  12/08/2020, 2:37 PM

## 2020-12-08 NOTE — Plan of Care (Signed)
  Problem: RH Car Transfers Goal: LTG Patient will perform car transfers with assist (PT) Description: LTG: Patient will perform car transfers with assistance (PT). Flowsheets (Taken 12/08/2020 0554) LTG: Pt will perform car transfers with assist:: (downgraded due to fluctuations in patient's progress due to medical fragility.) Minimal Assistance - Patient > 75% Note: downgraded due to fluctuations in patient's progress due to medical fragility.    Problem: RH Furniture Transfers Goal: LTG Patient will perform furniture transfers w/assist (OT/PT) Description: LTG: Patient will perform furniture transfers  with assistance (OT/PT). Flowsheets (Taken 12/08/2020 0554) LTG: Pt will perform furniture transfers with assist:: (downgraded due to fluctuations in patient's progress due to medical fragility.) Contact Guard/Touching assist Note: downgraded due to fluctuations in patient's progress due to medical fragility.    Problem: RH Ambulation Goal: LTG Patient will ambulate in controlled environment (PT) Description: LTG: Patient will ambulate in a controlled environment, # of feet with assistance (PT). Flowsheets (Taken 12/08/2020 0554) LTG: Pt will ambulate in controlled environ  assist needed:: (downgraded due to fluctuations in patient's progress due to medical fragility.) Contact Guard/Touching assist Note: downgraded due to fluctuations in patient's progress due to medical fragility.  Goal: LTG Patient will ambulate in home environment (PT) Description: LTG: Patient will ambulate in home environment, # of feet with assistance (PT). Flowsheets (Taken 12/08/2020 0554) LTG: Pt will ambulate in home environ  assist needed:: (downgraded due to fluctuations in patient's progress due to medical fragility.) Contact Guard/Touching assist Note: downgraded due to fluctuations in patient's progress due to medical fragility.    Problem: RH Stairs Goal: LTG Patient will ambulate up and down stairs w/assist  (PT) Description: LTG: Patient will ambulate up and down # of stairs with assistance (PT) Flowsheets (Taken 12/08/2020 0554) LTG: Pt will  ambulate up and down number of stairs: (Adjusted goal to accurately reflect patient's home set-up.) 5+7+7+7 steps with L rail for home entry to third floor apartment Note: Adjusted goal to accurately reflect patient's home set-up.

## 2020-12-08 NOTE — Progress Notes (Signed)
Physical Therapy Session Note  Patient Details  Name: Marcus Johnson. MRN: [id number] Date of Birth: 07/20/1961  Today's Date: 12/08/2020 PT Individual Time: 1540-1650 PT Individual Time Calculation (min): 70 min   Short Term Goals: Week 1:  PT Short Term Goal 1 (Week 1): Patient will perform bed mobility with min A consistently. PT Short Term Goal 1 - Progress (Week 1): Met PT Short Term Goal 2 (Week 1): Patient will perform basic transfers with CGA using LRAD. PT Short Term Goal 2 - Progress (Week 1): Progressing toward goal PT Short Term Goal 3 (Week 1): Patient will ambulate >50 ft without symptoms using LRAD. PT Short Term Goal 3 - Progress (Week 1): Partly met (met x1, but not consistent) PT Short Term Goal 4 (Week 1): Patient will tolerate sitting OOB >1 hour. PT Short Term Goal 4 - Progress (Week 1): Met PT Short Term Goal 5 (Week 1): Patient will initate stair training. PT Short Term Goal 5 - Progress (Week 1): Progressing toward goal Week 2:  PT Short Term Goal 1 (Week 2): Patient will perform basic transfers with CGA using LRAD consistently. PT Short Term Goal 1 - Progress (Week 2): Met PT Short Term Goal 2 (Week 2): Patient will consistently ambulate >50 ft using LRAD with CGA. PT Short Term Goal 2 - Progress (Week 2): Met PT Short Term Goal 3 (Week 2): Patient will initiate stair training. PT Short Term Goal 3 - Progress (Week 2): Met Week 3:  PT Short Term Goal 1 (Week 3): STG=LTG due to ELOS. Week 4:  PT Short Term Goal 1 (Week 4): STG=LTG due to ELOS. Week 5:     Skilled Therapeutic Interventions/Progress Updates:   Pt received sitting in EOB and agreeable to PT. PT placed pt on Battery power for Lvad Pt performed stand pivot transfer to [facility] with CGA. Pt transported to entrance of [place]. Gait training over unlevel cement sidewalk 3 x 55ft with supervision assist from PT with cues for RW management in turns. Sit<>stand transfer and strength training 2 x 5 with UE  support on arm rest and RW. PT transported pt to entrance of hospital in [name]. Seated LE therex LAQ x 12, calf raises x 15, hip abduction x 12 with manual resistance, reciprocal hip fleixon x 12. Pt required prolonged therapeutic rest breaks throughout session due to fatigue with mild dizziness reported pt pt intermittently. Patient returned to room and performed ambulatory transfer to bed with supervision assist and RW.  PT placed LVAD on wall power and, left pt sitting EOB  with call bell in reach and all needs met.        Therapy Documentation Precautions:  Precautions Precautions: Fall,Sternal,Other (comment) Precaution Comments: LVAD, sacral wound, Left BPPV Required Braces or Orthoses: Other Brace Other Brace: thumb loop and buddy strap R hand Restrictions Weight Bearing Restrictions: No RUE Weight Bearing:  (sternal precautions) Other Position/Activity Restrictions: sternal precautions Vital Signs: Therapy Vitals Temp: 98.6 F (37 C) Temp Source: Oral Pulse Rate: 96 Resp: 17 BP: (!) 113/94 Patient Position (if appropriate): Lying Oxygen Therapy SpO2: 100 % O2 Device: Room Air Pain: denies   Therapy/Group: Individual Therapy  Lorie Phenix 12/08/2020, 5:31 PM

## 2020-12-08 NOTE — Progress Notes (Signed)
Pt resting in bed on right side, all 4 batteries charging. Call light in reach, SR up x 4.

## 2020-12-08 NOTE — Progress Notes (Signed)
Covering for primary SW, Auria   SW spoke with pt son in reference to pt d/c on tomorrow. Potential pt will d/c Saturday due to HD. As of now son is shooting for Friday, son will speak with pt and provide update by the end of the day.  [place], [place]

## 2020-12-08 NOTE — Progress Notes (Addendum)
LVAD Coordinator Rounding Note:  HM III LVAD implanted on 10/13/20 by Dr Cyndia Bent under Destination Therapy criteria.  Discharged from [facility] and admitted to [facility] 11/14/20.  Patient dozing in chair in HD unit. He arouses easily, denies complaints.   Patient reports he thinks discharge is still pending an opening at [facility].   Home VAD equipment delivered to patient's room including Charity fundraiser, Mobile Power Unit, 4 battery clips, and 8 fully charged batteries, 14 daily dressing kits, and 10 anchors.   Vital signs: Temp: 98.4 HR: 97 Doppler: not documented Auto cuff:  100/62 O2 Sat: 99% on RA Wt: 183.6>190.9..........172.8>170.8>169.7>182.7>183.3>183.2 lbs    LVAD interrogation reveals:  Speed: 5200 Flow: 4.1 Power: 3.8w PI: 4.1 Hematocrit: 30  Alarms: none Events:   Fixed speed: 5200 Low speed limit: 4900  Drive Line:  Existing VAD dressing intact with small bloody drainage noted. Existing VAD dressing removed and site care performed using sterile technique. Drive line exit site cleaned with Chlora prep applicators x 2, Cellurate placed in exit site, and gauze dressingplaced over the site. Exit site healing with less incorporation noted than last dressing change, the velour is fully implanted at exit site. Evidence of drive line trauma with moderate amount ofbloody drainage on gauze, no redness, tenderness, foul odor or rash noted. Drive line anchor re-applied.Continuetwice weekly dressing changesusing silver strip on exit site. Next dressing change will be due 12/12/20. No family available for dressing change.   Small imbedded black suture removed from previous chest tube site.   Labs:   LDH trend: 199>191>196.............[phone]  INR trend: 1.5>1.7>1.9>2.4>3.2>2.9>3.2>2.9>2.2>2.1>2.3>1.9>1.8>1.9>2.1>1.9>1.9  Anticoagulation Plan: -INR Goal: 2.0-2.5 -ASA Dose: stopped 30 days post op per protocol  Device:  -  Medtronic single -   Pacing: VVI 40 - Therapies: on 200 bpm  Plan/Recommendations:  1. Call VAD coordinator for equipment or drive line issues 2. Twice weekly dressing changes while using acell. Next dressing due 12/12/20.   Zada Girt RN [place] Coordinator  Office: [phone]  24/7 Pager: [phone]

## 2020-12-08 NOTE — Progress Notes (Signed)
Occupational Therapy Session Note  Patient Details  Name: Marcus Johnson. MRN: [id number] Date of Birth: Apr 02, 1961  Today's Date: 12/08/2020 OT Individual Time: 2979-8921 OT Individual Time Calculation (min): 24 min   Pt missed 35 skilled OT d/t scheduling conflict and will follow up to makeup missed time.   Short Term Goals: Week 3:  OT Short Term Goal 1 (Week 3): Continue working toward established LTG  Skilled Therapeutic Interventions/Progress Updates:    1;1. Pt received with direct handoff from PT. Pt completes standing game of connect for 3x with focus on dynamic balance, functional reach, sit to stand and safety with mobility. Pt requires CGA overall for standing balance during reaching in mod ranges outside BOS. Pt requires seated rest break but no pain or woosiness noted. Ambulatory transfer back to bed with RW with CGA. Pt missed 35 min skilled OT d/t schedule conflict. Exited session with pt seated in bed, exit alarm on and call light in reach   Therapy Documentation Precautions:  Precautions Precautions: Fall,Sternal,Other (comment) Precaution Comments: LVAD, sacral wound, Left BPPV Required Braces or Orthoses: Other Brace Other Brace: thumb loop and buddy strap R hand Restrictions Weight Bearing Restrictions: No RUE Weight Bearing:  (sternal precautions) Other Position/Activity Restrictions: sternal precautions General:   Vital Signs: Therapy Vitals Temp: 98.4 F (36.9 C) Pulse Rate: 99 Resp: 17 BP: 110/75 Patient Position (if appropriate): Lying Oxygen Therapy SpO2: 100 % O2 Device: Room Air Pain:   ADL: ADL Eating: Set up Where Assessed-Eating: Bed level Grooming: Minimal assistance Where Assessed-Grooming: Edge of bed Upper Body Bathing: Unable to assess Lower Body Bathing: Unable to assess Upper Body Dressing: Unable to assess Lower Body Dressing: Maximal assistance Toileting: Unable to assess Toilet Transfer: Unable to  assess Tub/Shower Transfer Method: Unable to assess Gaffer Transfer: Unable to assess Vision   Perception    Praxis   Exercises:   Other Treatments:     Therapy/Group: Individual Therapy  Tonny Branch 12/08/2020, 6:48 AM

## 2020-12-08 NOTE — Progress Notes (Signed)
Pt discharging home tomorrow 12/09/20 per renal co-ordinater Colleen. Conformed with SW/CM

## 2020-12-08 NOTE — Progress Notes (Signed)
ANTICOAGULATION CONSULT NOTE - Follow Up Consult  Pharmacy Consult for Warfarin Indication: LVAD  Allergies  Allergen Reactions  . Meclizine Hcl Anaphylaxis and Swelling  . Ivabradine Nausea Only    Patient Measurements: Height: 5' 10.5" (179.1 cm) Weight:  (pt on the chair) IBW/kg (Calculated) : 74.15 kg  Vital Signs: Temp: 98.2 F (36.8 C) (05/05 0841) Temp Source: Oral (05/05 0841) BP: 112/58 (05/05 1100) Pulse Rate: 86 (05/05 1100)  Labs: Recent Labs    12/06/20 0414 12/07/20 0451 12/08/20 0354  HGB  --   --  8.9*  HCT  --   --  30.0*  PLT  --   --  194  LABPROT 23.3* 21.4* 21.8*  INR 2.1* 1.9* 1.9*  CREATININE 6.49* 4.61* 5.56*    Estimated Creatinine Clearance: 14.8 mL/min (A) (by C-G formula based on SCr of 5.56 mg/dL (H)).   Assessment: 60 yo male s/p new LVAD implant with HeartMate III on 3/10. Warfarin started per MD on 3/12, and pharmacy has now been consulted to take over warfarin dosing.   INR slightly subtherapeutic at 1.9, LDH stable.  Goal of Therapy: INR 2-2.5 Monitor platelets by anticoagulation protocol: Yes   Plan:  Warfarin 3mg  today Daily PT/INR  Monitor for signs and symptoms of bleeding  Arrie Senate, PharmD, BCPS, [facility] Clinical Pharmacist [phone] Please check AMION for all [place] numbers 12/08/2020

## 2020-12-08 NOTE — Progress Notes (Addendum)
Patient ID: Marcus Resurreccion., male   DOB: 1961-04-13, 60 y.o.   MRN: [id number]     Advanced Heart Failure Rounding Note  PCP-Cardiologist: None   Subjective:    - 2/27 Milrinone switched to dobutamine 5 mcg.  - 2/28 Moved to ICU. Dobutamine increased to 7.5 mcg and Norepi 3 mcg added. Diuretics held.  - 3/1 Impella 5.5 placed.  - 3/4 Teeth removed - 3/10 HM3 LVAD placed - 3/13 VAD speed increased to 5500. [name] out  - 3/14 Nephrology consulted. Given 1UPRBCs  - 3/15 Started CRRT. Given 1UPRBCs.  - 3/23 VAD Speed turned down to 5300  - 3/23 CRRT and Milrinone Stopped. Back in Afib - 3/24 CRRT restarted.  - 3/27 CVVH off.  - 3/28 iHD - 3/29 iHD. Walked 75 feet!! - 3/20 Ramp Echo: Fixed speed: 5200 Low speed limit: 4900 - 4/3 S/P RIJ HD catheter.  - 4/10 60th B-day!  - 4/11 Transferred to CIR. Back on heparin drip.  - 4/26 amio stopped due to tremors. - 4/27 Erythromycin    Just returned from HD.   INR 1.9 LDH 180   He now has seat at outpatient HD at [facility] MWF, first session planned Mon 5/9. Plan is to d/c from CIR on Sat 5/7.   LVAD Interrogation HM 3: Speed: 5200 Flow: 4.5  PI: 3.3 Power: 3.9 no PI events  VAD interrogated personally. Parameters stable.   Objective:   Weight Range: 83.1 kg Body mass index is 25.92 kg/m.   Vital Signs:   Temp:  [97.7 F (36.5 C)-98.4 F (36.9 C)] 98.2 F (36.8 C) (05/05 0841) Pulse Rate:  [78-99] 90 (05/05 0900) Resp:  [17-18] 18 (05/05 0841) BP: (87-112)/(52-86) 98/81 (05/05 0900) SpO2:  [92 %-100 %] 99 % (05/05 0841) Last BM Date: 12/07/20  Weight change: Filed Weights   12/06/20 0437 12/07/20 0432  Weight: 83.1 kg 83.1 kg    Intake/Output:   Intake/Output Summary (Last 24 hours) at 12/08/2020 0915 Last data filed at 12/08/2020 0030 Gross per 24 hour  Intake 240 ml  Output 2 ml  Net 238 ml      Physical Exam   General:  Well appearing. No distress  HEENT: normal  Neck: supple. No JVP.  Carotids 2+ bilat; no bruits. No lymphadenopathy or thryomegaly appreciated. Cor: LVAD hum.   Lungs: clear Abdomen: obese soft, nontender, non-distended. No hepatosplenomegaly. No bruits or masses. Good bowel sounds. Driveline site clean. Anchor in place. Extremities: no cyanosis, clubbing, rash. Warm no edema  Neuro: alert & oriented x 3. No focal deficits. Moves all 4 without problem    Labs    CBC Recent Labs    12/08/20 0354  WBC 8.1  HGB 8.9*  HCT 30.0*  MCV 88.2  PLT 568   Basic Metabolic Panel Recent Labs    12/07/20 0451 12/08/20 0354  NA 137 136  K 4.1 4.2  CL 99 98  CO2 27 26  GLUCOSE 99 115*  BUN 38* 49*  CREATININE 4.61* 5.56*  CALCIUM 9.2 9.5  MG 2.0 2.2  PHOS 3.5 4.1   Liver Function Tests Recent Labs    12/07/20 0451 12/08/20 0354  ALBUMIN 2.7* 2.8*   No results for input(s): LIPASE, AMYLASE in the last 72 hours. Cardiac Enzymes No results for input(s): CKTOTAL, CKMB, CKMBINDEX, TROPONINI in the last 72 hours.  BNP: BNP (last 3 results) Recent Labs    11/24/20 0454 12/01/20 0000 12/08/20 0354  BNP 723.8*  740.4* 777.5*    ProBNP (last 3 results) No results for input(s): PROBNP in the last 8760 hours.   D-Dimer No results for input(s): DDIMER in the last 72 hours. Hemoglobin A1C No results for input(s): HGBA1C in the last 72 hours. Fasting Lipid Panel No results for input(s): CHOL, HDL, LDLCALC, TRIG, CHOLHDL, LDLDIRECT in the last 72 hours. Thyroid Function Tests No results for input(s): TSH, T4TOTAL, T3FREE, THYROIDAB in the last 72 hours.  Invalid input(s): FREET3  Other results:   Imaging    No results found.   Medications:     Scheduled Medications: . (feeding supplement) PROSource Plus  30 mL Oral BID BM  . vitamin C  1,000 mg Oral Daily  . Chlorhexidine Gluconate Cloth  6 each Topical BID  . Chlorhexidine Gluconate Cloth  6 each Topical Q0600  . darbepoetin (ARANESP) injection - DIALYSIS  100 mcg Intravenous  Q Tue-HD  . erythromycin  250 mg Oral TID AC  . feeding supplement (NEPRO CARB STEADY)  237 mL Oral BID BM  . gabapentin  100 mg Oral QHS  . insulin aspart  0-15 Units Subcutaneous TID WC  . insulin aspart  0-5 Units Subcutaneous QHS  . insulin glargine  8 Units Subcutaneous Daily  . metoCLOPramide  10 mg Oral TID AC & HS  . midodrine  10 mg Oral TID WC  . multivitamin  1 tablet Oral QHS  . Muscle Rub   Topical BID  . pantoprazole  40 mg Oral Daily  . polyethylene glycol  17 g Oral BID  . rosuvastatin  10 mg Oral Daily  . scopolamine  1 patch Transdermal Q72H  . senna-docusate  2 tablet Oral BID  . sorbitol  30 mL Oral Daily  . Warfarin - Pharmacist Dosing Inpatient   Does not apply q1600    Infusions: . sodium chloride 10 mL (11/17/20 1430)  . sodium chloride 250 mL (12/01/20 1806)    PRN Medications: sodium chloride, sodium chloride, acetaminophen, alteplase, alum & mag hydroxide-simeth, bisacodyl, dextrose, diphenhydrAMINE, guaiFENesin-dextromethorphan, heparin, lidocaine-prilocaine, ondansetron (ZOFRAN) IV, oxyCODONE, pentafluoroprop-tetrafluoroeth, prochlorperazine **OR** prochlorperazine **OR** prochlorperazine, sodium chloride flush, sodium phosphate, white petrolatum    Assessment/Plan   1. Acute on chronic systolic HF -> cardiogenic shock:  ICM. Has Medtronic ICD. EF 15%. Low output persisted despite milrinone and then dobutamine with rise in creatinine to 2.5. Impella 5.5 placed 3/1.  Creatinine improved to 1.7 and HM3 LVAD was placed on 3/10. Developed post-op renal failure requiring CVVH. Now off milrinone and epinephrine. Last echo showed moderate RV enlargement with mildly decreased RV function and VAD speed decreased to 5300 due to left-sided septum. CRRT stopped 3/23 but restarted 3/24. CVVH stopped again on 3/27. Ramp echo 3/30 with speed decreased to 5200. Tolerating iHD for volume management.  MAPs stable on midodrine. - Continue Midodrine 10 mg tid due to  dizzinness/orthostasis (resolved for the most part).    - Continue compression stockings.   - HD for volume management  2. LVAD: 3/13 VAD speed increased to 5500. 3/23 speed decreased to 5300 and decreased to 5200 on 3/30. VAD interrogated personally. Parameters stable. LDH 167, INR 1.9 today  - On warfarin, on ASA 81 daily.  - DL site ok 3. CAD s/p CABG 2002:  Last cath in 6/19 with patent grafts. No s/s angina- Continue Crestor 40 mg daily.  4. AKI on CKD Stage 3b: Suspect that this is a combination of cardiorenal syndrome and diabetic nephropathy and possibly ATN. Developed post-op  AKI. Started on CRRT 3/15. CVVH stopped 3/23 but restarted 3/24. Now getting iHD, looks like he will be HD-dependent.  Has tunneled catheter.  - HD T/Th/S - Renal and VVS discussing, but with continuous flow LVAD, think AV fistula would be less likely to mature.  Patient wants to wait on getting AV graft until some point after discharge. No change - He now has seat at outpatient HD at [facility] MWF, first session planned Mon 5/9. Appreciate nephrology SW assistance  5. Atrial flutter/fibrillation: s/p DC-CV 08/21/18.  - 4/26 Off amio.  6. Wound R Foot:  Resolved.  7. Right arm pain: Likely neuropathic from stretch of brachial plexus with Impella placement.  - Continue gabapentin. Able to move right hand a little better.  8. ID:  WBCs normal.  9. DM: History of very poor control but hgbA1c trending down. Most recent 7.7%.  Insulin drip stopped 3/24. Blood glucose controlled on reduced Lantus and  sliding scale insulin to moderate.   - Diabetes coordinators have seen 10. Anemia: Stable CBC. hGB 9.2 on 4/30 11. Deconditioning - Continue to work with CIR. Appreciate their assistance  12. Unstageable Pressure Ulcer on buttocks: Much improved 100% granulation.  - Hydrotherapy completed - Continue repositioning 13. NSVT: Stopped amio with tremors.  14. Dizziness: Still with symptoms that seem like BPPV  at times. Has had Epley maneuvers.  15. Suspected Gastroparesis. Nausea resolved on Reglan and erythromycin.   He now has seat at outpatient HD at [facility] MWF, first session planned Mon 5/9. Appreciate nephrology SW's assistance. Plan is to d/c from CIR on Saturday 5/7. Will work on getting VAD clinic f/u arranged.    Marcus Johnson 12/08/2020  Patient seen with PA, agree with the above note.   Stable today, continues to work with PT.  LVAD parameters stable.   Plan for discharge home on Saturday from CIR .   No changes from my standpoint.   Marcus Johnson 12/08/2020

## 2020-12-08 NOTE — Procedures (Signed)
I was present at this dialysis session. I have reviewed the session itself and made appropriate changes.   Sene on HD. Tolerating well. No c/o. 2K bath.  2L UF goal.    [facility] Weights   12/06/20 0437 12/07/20 0432  Weight: 83.1 kg 83.1 kg    Recent Labs  Lab 12/08/20 0354  NA 136  K 4.2  CL 98  CO2 26  GLUCOSE 115*  BUN 49*  CREATININE 5.56*  CALCIUM 9.5  PHOS 4.1    Recent Labs  Lab 12/03/20 0317 12/08/20 0354  WBC 8.6 8.1  HGB 9.2* 8.9*  HCT 31.2* 30.0*  MCV 89.4 88.2  PLT 208 194    Scheduled Meds: . (feeding supplement) PROSource Plus  30 mL Oral BID BM  . vitamin C  1,000 mg Oral Daily  . Chlorhexidine Gluconate Cloth  6 each Topical BID  . Chlorhexidine Gluconate Cloth  6 each Topical Q0600  . darbepoetin (ARANESP) injection - DIALYSIS  100 mcg Intravenous Q Tue-HD  . erythromycin  250 mg Oral TID AC  . feeding supplement (NEPRO CARB STEADY)  237 mL Oral BID BM  . gabapentin  100 mg Oral QHS  . insulin aspart  0-15 Units Subcutaneous TID WC  . insulin aspart  0-5 Units Subcutaneous QHS  . insulin glargine  8 Units Subcutaneous Daily  . metoCLOPramide  10 mg Oral TID AC & HS  . midodrine  10 mg Oral TID WC  . multivitamin  1 tablet Oral QHS  . Muscle Rub   Topical BID  . pantoprazole  40 mg Oral Daily  . polyethylene glycol  17 g Oral BID  . rosuvastatin  10 mg Oral Daily  . scopolamine  1 patch Transdermal Q72H  . senna-docusate  2 tablet Oral BID  . sorbitol  30 mL Oral Daily  . Warfarin - Pharmacist Dosing Inpatient   Does not apply q1600   Continuous Infusions: . sodium chloride 10 mL (11/17/20 1430)  . sodium chloride 250 mL (12/01/20 1806)   PRN Meds:.sodium chloride, sodium chloride, acetaminophen, alteplase, alum & mag hydroxide-simeth, bisacodyl, dextrose, diphenhydrAMINE, guaiFENesin-dextromethorphan, heparin, lidocaine-prilocaine, ondansetron (ZOFRAN) IV, oxyCODONE, pentafluoroprop-tetrafluoroeth, prochlorperazine **OR** prochlorperazine  **OR** prochlorperazine, sodium chloride flush, sodium phosphate, white petrolatum   Pearson Grippe  MD 12/08/2020, 9:05 AM

## 2020-12-08 NOTE — Progress Notes (Signed)
PROGRESS NOTE   Subjective/Complaints: Had a difficult day yesterday with dizziness, fatigue, leg weakness. Didn't do as well with PT.  Feeling better this morning. About to go off to HD  ROS: Patient denies fever, rash, sore throat, blurred vision, vomiting, diarrhea, cough, shortness of breath   joint or back pain, headache, or mood change.    Objective:   No results found. Recent Labs    12/08/20 0354  WBC 8.1  HGB 8.9*  HCT 30.0*  PLT 194   Recent Labs    12/07/20 0451 12/08/20 0354  NA 137 136  K 4.1 4.2  CL 99 98  CO2 27 26  GLUCOSE 99 115*  BUN 38* 49*  CREATININE 4.61* 5.56*  CALCIUM 9.2 9.5    Intake/Output Summary (Last 24 hours) at 12/08/2020 1112 Last data filed at 12/08/2020 0030 Gross per 24 hour  Intake 240 ml  Output 2 ml  Net 238 ml     Pressure Injury 10/16/20 Coccyx Mid Stage 3 -  Full thickness tissue loss. Subcutaneous fat may be visible but bone, tendon or muscle are NOT exposed. Blister that has drained, 2 cm X 4.5 cm (Active)  10/16/20 0800  Location: Coccyx  Location Orientation: Mid  Staging: Stage 3 -  Full thickness tissue loss. Subcutaneous fat may be visible but bone, tendon or muscle are NOT exposed. (as per WOC note on 11/14/20)  Wound Description (Comments): Blister that has drained, 2 cm X 4.5 cm  Present on Admission: No     Pressure Injury 10/16/20 Coccyx Right Stage 2 -  Partial thickness loss of dermis presenting as a shallow open injury with a red, pink wound bed without slough. blister that has drained, 0.5 cm X 0.5 cm (Active)  10/16/20 0800  Location: Coccyx  Location Orientation: Right  Staging: Stage 2 -  Partial thickness loss of dermis presenting as a shallow open injury with a red, pink wound bed without slough.  Wound Description (Comments): blister that has drained, 0.5 cm X 0.5 cm  Present on Admission: No    Physical Exam: Vital Signs Blood pressure (!)  112/58, pulse 86, temperature 98.2 F (36.8 C), temperature source Oral, resp. rate 18, height 5' 10.5" (1.791 m), weight 83.1 kg, SpO2 99 %. Constitutional: No distress . Vital signs reviewed. HEENT: EOMI, oral membranes moist Neck: supple Cardiovascular: LVAD hum   Respiratory/Chest: CTA Bilaterally without wheezes or rales. Normal effort    GI/Abdomen: BS +, non-tender, non-distended Ext: no clubbing, cyanosis, or edema Psych: pleasant and cooperative Skin:  cath right shoulder in place.LVAD site dry, sacral wound not visualized Neuro: Alert and oriented x 3. Normal insight and awareness. STM deficits. Normal language and speech. Cranial nerve exam unremarkable  Sensory exam is normal. Reflexes are 2+ in all 4's. Fine motor coordination is intact. R>L UE tremors mild today. Motor function is grossly 4/5 with tremor. RUE  4-/5.  LE motor 4/5 with more weakness proximally--no motor changes Musculoskeletal:  Mild RUE discomfort with AROM   Assessment/Plan: 1. Functional deficits which require 3+ hours per day of interdisciplinary therapy in a comprehensive inpatient rehab setting.  Physiatrist is providing close team supervision  and 24 hour management of active medical problems listed below.  Physiatrist and rehab team continue to assess barriers to discharge/monitor patient progress toward functional and medical goals  Care Tool:  Bathing  Bathing activity did not occur:  (time constraints) Body parts bathed by patient: Left arm,Abdomen,Chest,Front perineal area,Right upper leg,Left upper leg,Face,Right arm,Buttocks   Body parts bathed by helper: Right lower leg,Left lower leg     Bathing assist Assist Level: Moderate Assistance - Patient 50 - 74%     Upper Body Dressing/Undressing Upper body dressing Upper body dressing/undressing activity did not occur (including orthotics): Environmental limitations (PICC running) What is the patient wearing?: Pull over shirt    Upper body  assist Assist Level: Minimal Assistance - Patient > 75%    Lower Body Dressing/Undressing Lower body dressing      What is the patient wearing?: Pants     Lower body assist Assist for lower body dressing: Moderate Assistance - Patient 50 - 74%     Toileting Toileting Toileting Activity did not occur Landscape architect and hygiene only): N/A (no void or bm)  Toileting assist Assist for toileting: Dependent - Patient 0%     Transfers Chair/bed transfer  Transfers assist  Chair/bed transfer activity did not occur: Safety/medical concerns  Chair/bed transfer assist level: Supervision/Verbal cueing Chair/bed transfer assistive device: Programmer, multimedia   Ambulation assist   Ambulation activity did not occur: Safety/medical concerns  Assist level: Contact Guard/Touching assist Assistive device: Walker-rolling Max distance: >100 ft   Walk 10 feet activity   Assist  Walk 10 feet activity did not occur: Safety/medical concerns  Assist level: Contact Guard/Touching assist Assistive device: Walker-rolling   Walk 50 feet activity   Assist Walk 50 feet with 2 turns activity did not occur: Safety/medical concerns  Assist level: Contact Guard/Touching assist Assistive device: Walker-rolling    Walk 150 feet activity   Assist Walk 150 feet activity did not occur: Safety/medical concerns  Assist level: Contact Guard/Touching assist Assistive device: Walker-rolling    Walk 10 feet on uneven surface  activity   Assist Walk 10 feet on uneven surfaces activity did not occur: Safety/medical concerns         Wheelchair     Assist Will patient use wheelchair at discharge?: No (Per PT long term goals)   Wheelchair activity did not occur: Safety/medical concerns         Wheelchair 50 feet with 2 turns activity    Assist    Wheelchair 50 feet with 2 turns activity did not occur: Safety/medical concerns       Wheelchair 150 feet  activity     Assist  Wheelchair 150 feet activity did not occur: Safety/medical concerns       Blood pressure (!) 112/58, pulse 86, temperature 98.2 F (36.8 C), temperature source Oral, resp. rate 18, height 5' 10.5" (1.791 m), weight 83.1 kg, SpO2 99 %.  Medical Problem List and Plan: 1.Functional and mobility deficitssecondary to debility after acute on chronic CHF with subsequent LVAD placement -patient maynotshower -ELOS/Goals: 5/2  -Continue CIR therapies including PT, OT    -pt with STM deficits  -scopolamine patch for ?vestibular symptoms-will continue given vestibular sx today  -HD on AM schedule so that we can work on stairs afterwards   -needs midodrine immediately after HD   -did well 5/3, not so much 5/4.  2. Chronic systolic CHF s/p LVAD 3:  -DVT/anticoagulation:Pharmaceutical:Continue Coumadin per pharmacy -antiplatelet therapy: ASA 3.Right arm neuropathic pain/brachial  plexus injury/Pain Management:Tylenol or oxycodone prn.  -- gabapentin 200 mgat bedtime with some benefit -sling prn for RUE support and comfort  -expect recovery of motor/sensory function in RUE  5/5 well controlled, continue current regimen.  4. Mood:LCSW to follow for evaluation and support. -antipsychotic agents: N/A 5. Neuropsych: This patientiscapable of making decisions on hisown behalf. 6. Skin/Wound Care:Optimize nutritional status--continue Nephro and prosource.  --Sacral decubitus: Air mattress.    -appears much improved-->change to hydrogel/gauze daily   -pt is NO LONGER RECEIVING HYDRO --Right foot wound resolved.  7. Fluids/Electrolytes/Nutrition:Strict I/O. Continue to monitor labs with HD? 8. Chronic systolic CHF s/p LVAD 3:  --Cardiology to follow and manage cardiac issues.  -5/5 weights stable, volume mgt per cards/nephro Filed Weights    12/06/20 0437 12/07/20 0432  Weight: 83.1 kg 83.1 kg     -off revatio   -TEDS, acclimation   - continue midodrine 10mg  tid (give dose immediately after HD to prop up bp for stairs/activity) 9. Acute on chronic anemia: Continue to monitor H/H/ monitor for signs of bleeding.  --Was transfused with multiple units PRBC. --ON Nulecit Tu/Th/Sa  -transfuse as needed in HD hgb holding at 8.6 10. T2DM: Hgb A1c- monitor BS ac/hs.BS reasonable on current intake --continue Lantus in am with SSI for elevated BS.    5/2   improved after lantus decreased to 8u qam     CBG (last 3)  Recent Labs    12/07/20 1623 12/07/20 2058 12/08/20 0612  GLUCAP 146* 177* 102*    5/4 bumped lantus to 10u qam to cover PM better--observe today 11. ESRD: Hyperkalemia being managed with lokelma/HD. -see above 12. Mild leucocytosis: Resolved  13. A fib: NSR, HR remains controlled on Amiodarone, continue 14. Nausea: likely d/t to peristalsis/gastroparesis---improved  -keep bowels moving---+bm 5/1  -continue reglan 10mg  AC/HS   -amiodarone reduced per cards   -needs bm 5/4, sorbitol, dulcolax supp    LOS: 24 days A FACE TO [place] 12/08/2020, 11:12 AM

## 2020-12-09 ENCOUNTER — Encounter (HOSPITAL_COMMUNITY): Payer: Self-pay | Admitting: Unknown Physician Specialty

## 2020-12-09 ENCOUNTER — Other Ambulatory Visit (HOSPITAL_COMMUNITY): Payer: Self-pay

## 2020-12-09 DIAGNOSIS — Z992 Dependence on renal dialysis: Secondary | ICD-10-CM

## 2020-12-09 DIAGNOSIS — N2581 Secondary hyperparathyroidism of renal origin: Secondary | ICD-10-CM | POA: Insufficient documentation

## 2020-12-09 DIAGNOSIS — K3184 Gastroparesis: Secondary | ICD-10-CM

## 2020-12-09 DIAGNOSIS — I4891 Unspecified atrial fibrillation: Secondary | ICD-10-CM

## 2020-12-09 DIAGNOSIS — D509 Iron deficiency anemia, unspecified: Secondary | ICD-10-CM | POA: Insufficient documentation

## 2020-12-09 DIAGNOSIS — D638 Anemia in other chronic diseases classified elsewhere: Secondary | ICD-10-CM

## 2020-12-09 DIAGNOSIS — T7840XA Allergy, unspecified, initial encounter: Secondary | ICD-10-CM | POA: Insufficient documentation

## 2020-12-09 DIAGNOSIS — T829XXA Unspecified complication of cardiac and vascular prosthetic device, implant and graft, initial encounter: Secondary | ICD-10-CM | POA: Insufficient documentation

## 2020-12-09 DIAGNOSIS — I951 Orthostatic hypotension: Secondary | ICD-10-CM

## 2020-12-09 LAB — PROTIME-INR
INR: 1.7 — ABNORMAL HIGH (ref 0.8–1.2)
Prothrombin Time: 20.2 seconds — ABNORMAL HIGH (ref 11.4–15.2)

## 2020-12-09 LAB — RENAL FUNCTION PANEL
Albumin: 2.8 g/dL — ABNORMAL LOW (ref 3.5–5.0)
Anion gap: 8 (ref 5–15)
BUN: 32 mg/dL — ABNORMAL HIGH (ref 6–20)
CO2: 28 mmol/L (ref 22–32)
Calcium: 9.2 mg/dL (ref 8.9–10.3)
Chloride: 99 mmol/L (ref 98–111)
Creatinine, Ser: 3.96 mg/dL — ABNORMAL HIGH (ref 0.61–1.24)
GFR, Estimated: 17 mL/min — ABNORMAL LOW
Glucose, Bld: 109 mg/dL — ABNORMAL HIGH (ref 70–99)
Phosphorus: 3.4 mg/dL (ref 2.5–4.6)
Potassium: 3.6 mmol/L (ref 3.5–5.1)
Sodium: 135 mmol/L (ref 135–145)

## 2020-12-09 LAB — GLUCOSE, CAPILLARY
Glucose-Capillary: 108 mg/dL — ABNORMAL HIGH (ref 70–99)
Glucose-Capillary: 109 mg/dL — ABNORMAL HIGH (ref 70–99)

## 2020-12-09 LAB — MAGNESIUM: Magnesium: 1.9 mg/dL (ref 1.7–2.4)

## 2020-12-09 LAB — LACTATE DEHYDROGENASE: LDH: 174 U/L (ref 98–192)

## 2020-12-09 MED ORDER — WARFARIN SODIUM 3 MG PO TABS: 3.0000 mg | ORAL_TABLET | Freq: Once | ORAL | Status: DC

## 2020-12-09 MED ORDER — ROSUVASTATIN CALCIUM 10 MG PO TABS
10.0000 mg | ORAL_TABLET | Freq: Every day | ORAL | 0 refills | Status: DC
  Filled 2020-12-09: qty 30, 30d supply, fill #0

## 2020-12-09 MED ORDER — METOCLOPRAMIDE HCL 10 MG PO TABS
10.0000 mg | ORAL_TABLET | Freq: Three times a day (TID) | ORAL | 0 refills | Status: DC
  Filled 2020-12-09: qty 90, 30d supply, fill #0

## 2020-12-09 MED ORDER — SENNOSIDES-DOCUSATE SODIUM 8.6-50 MG PO TABS
2.0000 | ORAL_TABLET | Freq: Two times a day (BID) | ORAL | 0 refills | Status: DC
  Filled 2020-12-09: qty 120, 30d supply, fill #0

## 2020-12-09 MED ORDER — SCOPOLAMINE 1 MG/3DAYS TD PT72
1.0000 | MEDICATED_PATCH | TRANSDERMAL | 0 refills | Status: DC
  Filled 2020-12-09: qty 10, 30d supply, fill #0

## 2020-12-09 MED ORDER — MIDODRINE HCL 10 MG PO TABS
10.0000 mg | ORAL_TABLET | Freq: Three times a day (TID) | ORAL | 0 refills | Status: DC
  Filled 2020-12-09: qty 90, 30d supply, fill #0

## 2020-12-09 MED ORDER — ERYTHROMYCIN BASE 250 MG PO TABS: 250.0000 mg | ORAL_TABLET | Freq: Three times a day (TID) | ORAL | 0 refills | Status: DC

## 2020-12-09 MED ORDER — POLYETHYLENE GLYCOL 3350 17 GM/SCOOP PO POWD
17.0000 g | Freq: Two times a day (BID) | ORAL | 0 refills | Status: DC
  Filled 2020-12-09: qty 510, 15d supply, fill #0

## 2020-12-09 MED ORDER — ASCORBIC ACID 1000 MG PO TABS
1000.0000 mg | ORAL_TABLET | Freq: Every day | ORAL | 0 refills | Status: DC
  Filled 2020-12-09: qty 30, 30d supply, fill #0

## 2020-12-09 MED ORDER — INSULIN GLARGINE 100 UNIT/ML ~~LOC~~ SOLN: 8.0000 [IU] | Freq: Every day | SUBCUTANEOUS | 11 refills | Status: DC

## 2020-12-09 MED ORDER — WARFARIN SODIUM 4 MG PO TABS
4.0000 mg | ORAL_TABLET | Freq: Once | ORAL | Status: AC
  Administered 2020-12-09: 4 mg via ORAL
  Filled 2020-12-09: qty 1

## 2020-12-09 MED ORDER — PANTOPRAZOLE SODIUM 40 MG PO TBEC
40.0000 mg | DELAYED_RELEASE_TABLET | Freq: Every day | ORAL | 0 refills | Status: DC
  Filled 2020-12-09: qty 30, 30d supply, fill #0

## 2020-12-09 MED ORDER — RENA-VITE PO TABS
1.0000 | ORAL_TABLET | Freq: Every day | ORAL | 0 refills | Status: DC
  Filled 2020-12-09: qty 30, 30d supply, fill #0

## 2020-12-09 MED ORDER — WARFARIN SODIUM 3 MG PO TABS
3.0000 mg | ORAL_TABLET | Freq: Every day | ORAL | 0 refills | Status: DC
  Filled 2020-12-09: qty 30, 30d supply, fill #0

## 2020-12-09 MED ORDER — OXYCODONE HCL 5 MG PO TABS
5.0000 mg | ORAL_TABLET | Freq: Four times a day (QID) | ORAL | 0 refills | Status: DC | PRN
  Filled 2020-12-09: qty 24, 6d supply, fill #0

## 2020-12-09 MED ORDER — MUSCLE RUB 10-15 % EX CREA
1.0000 "application " | TOPICAL_CREAM | Freq: Two times a day (BID) | CUTANEOUS | 0 refills | Status: DC
  Filled 2020-12-09 (×2): qty 85, 43d supply, fill #0

## 2020-12-09 MED ORDER — GABAPENTIN 100 MG PO CAPS
100.0000 mg | ORAL_CAPSULE | Freq: Every day | ORAL | 0 refills | Status: DC
  Filled 2020-12-09: qty 30, 30d supply, fill #0

## 2020-12-09 MED ORDER — ERYTHROMYCIN BASE 250 MG PO TABS
250.0000 mg | ORAL_TABLET | Freq: Three times a day (TID) | ORAL | 0 refills | Status: DC
  Filled 2020-12-09: qty 21, 7d supply, fill #0

## 2020-12-09 NOTE — Progress Notes (Signed)
ANTICOAGULATION CONSULT NOTE - Follow Up Consult  Pharmacy Consult for Warfarin Indication: LVAD  Allergies  Allergen Reactions  . Meclizine Hcl Anaphylaxis and Swelling  . Ivabradine Nausea Only    Patient Measurements: Height: 5' 10.5" (179.1 cm) Weight:  (pt on the chair) IBW/kg (Calculated) : 74.15 kg  Vital Signs: Temp: 98.5 F (36.9 C) (05/06 0623) BP: 116/82 (05/06 0623) Pulse Rate: 97 (05/06 0623)  Labs: Recent Labs    12/07/20 0451 12/08/20 0354 12/09/20 0300  HGB  --  8.9*  --   HCT  --  30.0*  --   PLT  --  194  --   LABPROT 21.4* 21.8* 20.2*  INR 1.9* 1.9* 1.7*  CREATININE 4.61* 5.56* 3.96*    Estimated Creatinine Clearance: 20.8 mL/min (A) (by C-G formula based on SCr of 3.96 mg/dL (H)).   Assessment: 60 yo male s/p new LVAD implant with HeartMate III on 3/10. Warfarin started per MD on 3/12, and pharmacy has now been consulted to take over warfarin dosing.   INR dropped further subtherapeutic at 1.7,  LDH stable at 174. CBC stable on 5/5.  Goal of Therapy: INR 2-2.5 Monitor platelets by anticoagulation protocol: Yes   Plan:  Warfarin 4 mg today before he discharges to get into goal range  Daily PT/INR  Monitor for signs and symptoms of bleeding Regimen at discharge - warfarin 3 mg daily   Antonietta Jewel, PharmD, [place] Pharmacist  Phone: [phone] 12/09/2020 11:08 AM  Please check AMION for all [place] phone numbers After 10:00 PM, call [place] [phone]

## 2020-12-09 NOTE — Progress Notes (Signed)
Physical Therapy Discharge Summary  Patient Details  Name: Marcus Johnson. MRN: [id number] Date of Birth: 1961-01-28  Today's Date: 12/09/2020    Patient has met 10 of 10 long term goals due to improved activity tolerance, improved balance, improved postural control, increased strength, increased range of motion, decreased pain, ability to compensate for deficits, functional use of  right upper extremity and right lower extremity, improved attention and improved awareness.  Patient to discharge at an ambulatory level Supervision.   Patient's care partner is independent to provide the necessary physical and cognitive assistance at discharge.  Reasons goals not met: All PT goals met   Recommendation:  Patient will benefit from ongoing skilled PT services in home health setting to continue to advance safe functional mobility, address ongoing impairments in balance, gait, transfers, strenght, awareness, stair management, and minimize fall risk.  Equipment: RW, WC  Reasons for discharge: treatment goals met and discharge from hospital  Patient/family agrees with progress made and goals achieved: Yes  PT Discharge Precautions/Restrictions Precautions Precautions: Fall;Sternal;Other (comment) Precaution Booklet Issued: Yes (comment) Precaution Comments: LVAD, sacral wound, Left BPPV Required Braces or Orthoses: Other Brace Other Brace: thumb loop and buddy strap R hand Restrictions Weight Bearing Restrictions: No Other Position/Activity Restrictions: sternal precautions Vital Signs Therapy Vitals Temp: (!) 97.5 F (36.4 C) Temp Source: Oral Pulse Rate: 98 Resp: 18 BP: 114/78 Patient Position (if appropriate): Sitting Oxygen Therapy SpO2: 96 % O2 Device: Room Air Pain Pain Assessment Pain Scale: 0-10 Pain Score: 0-No pain Vision/Perception  Vision - Assessment Eye Alignment: Impaired (comment) Ocular Range of Motion: Within Functional Limits Tracking/Visual  Pursuits: Able to track stimulus in all quads without difficulty Saccades: Within functional limits Perception Perception: Within Functional Limits Praxis Praxis: Intact  Cognition Overall Cognitive Status: Impaired/Different from baseline Arousal/Alertness: Awake/alert Selective Attention: Impaired Memory: Impaired Memory Impairment: Decreased recall of new information Problem Solving: Impaired Sequencing: Impaired Safety/Judgment: Appears intact Sensation Sensation Light Touch: Impaired Detail Central sensation comments: B feet impaired sensation. Impaired sensation in median nerve distribution of R UE Light Touch Impaired Details: Impaired RUE;Impaired RLE;Impaired LLE Coordination Gross Motor Movements are Fluid and Coordinated: No Fine Motor Movements are Fluid and Coordinated: No Coordination and Movement Description: Significant deconditioning and generalized weakness, limited upper extremity coordinationation due to R plexus injury and jerking movements Heel Shin Test: limited hip flexion ROM bilateraaly Motor  Motor Motor: Other (comment) Motor - Skilled Clinical Observations: generalized deconditioning  Mobility Bed Mobility Rolling Right: Independent with assistive device Rolling Left: Independent with assistive device Supine to Sit: Supervision/Verbal cueing Sit to Supine: Supervision/Verbal cueing Transfers Sit to Stand: Contact Guard/Touching assist Stand to Sit: Contact Guard/Touching assist Transfer (Assistive device): Rolling walker Locomotion  Gait Ambulation: Yes Gait Assistance: Supervision/Verbal cueing Gait Distance (Feet): 150 Feet Assistive device: Rolling walker Gait Gait: Yes Stairs / Additional Locomotion Stairs: Yes Stair Management Technique: No rails Number of Stairs: 12 Height of Stairs: 6 Wheelchair Mobility Wheelchair Mobility: Yes  Trunk/Postural Assessment  Cervical Assessment Cervical Assessment:  (forward head) Thoracic  Assessment Thoracic Assessment:  (rounded shoudlers) Lumbar Assessment Lumbar Assessment:  (post pelvic tilt) Postural Control Postural Control: Deficits on evaluation (post bias- improved since eval but still requries A to correct)  Balance Balance Balance Assessed: Yes Static Sitting Balance Static Sitting - Level of Assistance: 5: Stand by assistance Dynamic Sitting Balance Dynamic Sitting - Level of Assistance: 5: Stand by assistance Static Standing Balance Static Standing - Balance Support: No upper extremity supported Static Standing -  Level of Assistance: 4: Min assist Extremity Assessment  RUE Assessment General Strength Comments: Shoulder AROM to 90 degrees, unable to flex elbow more than 110 degrees d/t pain in elbow. Edema in R hand, especially along median nerve distribution. able to oppose thumb with 2nd and 3rd finger. LUE Assessment LUE Assessment: Within Functional Limits RLE Assessment RLE Assessment: Exceptions to [facility] Active Range of Motion (AROM) Comments: WFL for functional mobility General Strength Comments: Grossly in sitting 4 to 4+/5 LLE Assessment LLE Assessment: Exceptions to [facility] Active Range of Motion (AROM) Comments: WFL for functional mobility General Strength Comments: Grossly in sitting 4 to 4+/5    Lorie Phenix 12/09/2020, 5:25 PM

## 2020-12-09 NOTE — Progress Notes (Signed)
Renal Navigator informed Renal PA of discharge after HD today and asked her to send outpatient orders to [facility].  Alphonzo Cruise, [place] Renal Navigator [phone]

## 2020-12-09 NOTE — Procedures (Signed)
I was present at this dialysis session. I have reviewed the session itself and made appropriate changes.   2L UF goal.  3K bath. TDC.    Ready to go home.  WIll be at [facility] next week. Advised caution on dietary Na and Fliuds over weekend.   Filed Weights   12/06/20 0437 12/07/20 0432  Weight: 83.1 kg 83.1 kg    Recent Labs  Lab 12/09/20 0300  NA 135  K 3.6  CL 99  CO2 28  GLUCOSE 109*  BUN 32*  CREATININE 3.96*  CALCIUM 9.2  PHOS 3.4    Recent Labs  Lab 12/03/20 0317 12/08/20 0354  WBC 8.6 8.1  HGB 9.2* 8.9*  HCT 31.2* 30.0*  MCV 89.4 88.2  PLT 208 194    Scheduled Meds: . (feeding supplement) PROSource Plus  30 mL Oral BID BM  . vitamin C  1,000 mg Oral Daily  . Chlorhexidine Gluconate Cloth  6 each Topical BID  . Chlorhexidine Gluconate Cloth  6 each Topical Q0600  . darbepoetin (ARANESP) injection - DIALYSIS  100 mcg Intravenous Q Tue-HD  . erythromycin  250 mg Oral TID AC  . feeding supplement (NEPRO CARB STEADY)  237 mL Oral BID BM  . gabapentin  100 mg Oral QHS  . insulin aspart  0-15 Units Subcutaneous TID WC  . insulin aspart  0-5 Units Subcutaneous QHS  . insulin glargine  8 Units Subcutaneous Daily  . metoCLOPramide  10 mg Oral TID AC & HS  . midodrine  10 mg Oral TID WC  . multivitamin  1 tablet Oral QHS  . Muscle Rub   Topical BID  . pantoprazole  40 mg Oral Daily  . polyethylene glycol  17 g Oral BID  . rosuvastatin  10 mg Oral Daily  . scopolamine  1 patch Transdermal Q72H  . senna-docusate  2 tablet Oral BID  . sorbitol  30 mL Oral Daily  . warfarin  4 mg Oral Once  . Warfarin - Pharmacist Dosing Inpatient   Does not apply q1600   Continuous Infusions: . sodium chloride 10 mL (11/17/20 1430)  . sodium chloride 250 mL (12/01/20 1806)   PRN Meds:.sodium chloride, sodium chloride, acetaminophen, alteplase, alum & mag hydroxide-simeth, bisacodyl, dextrose, diphenhydrAMINE, guaiFENesin-dextromethorphan, heparin, lidocaine-prilocaine,  ondansetron (ZOFRAN) IV, oxyCODONE, pentafluoroprop-tetrafluoroeth, prochlorperazine **OR** prochlorperazine **OR** prochlorperazine, sodium chloride flush, sodium phosphate, white petrolatum   Pearson Grippe  MD 12/09/2020, 1:08 PM

## 2020-12-09 NOTE — Progress Notes (Signed)
Occupational Therapy Discharge Summary  Patient Details  Name: Marcus Johnson. MRN: [id number] Date of Birth: Jul 07, 1961   Patient has met 7 of 9 long term goals due to improved activity tolerance, improved balance, postural control, ability to compensate for deficits, functional use of  RIGHT upper extremity, improved awareness and improved coordination.  Patient to discharge at [facility] Assist level.  Patient's care partner is independent to provide the necessary physical and cognitive assistance at discharge. Pt's brother as well as son were present for family education and were able to demonstrate safe transfers as well as A for dressing. Family supportive and son physically strong. emphasized pt taking time for all tasks to not rush. Therapy still with concerns of conditioning and access to third floor apartment, however pt and family wanting to DC home despite encouragement to continue with tx to improve efficiency with stair negotiation and decreased effort with accessing apartment after dialysis.   Reasons goals not met: Pt did not meet LB dressing or bathing goal d/t need fo rCGA for standing balance and A to don teds/shoes.  Recommendation:  Patient will benefit from ongoing skilled OT services in home health setting to continue to advance functional skills in the area of BADL and iADL.  Equipment: BSC  Reasons for discharge: treatment goals met and discharge from hospital  Patient/family agrees with progress made and goals achieved: Yes  OT Discharge Precautions/Restrictions  Precautions Precautions: Fall;Sternal;Other (comment) Precaution Booklet Issued: Yes (comment) Precaution Comments: LVAD, sacral wound, Left BPPV Required Braces or Orthoses: Other Brace Other Brace: thumb loop and buddy strap R hand Restrictions Weight Bearing Restrictions: No Other Position/Activity Restrictions: sternal precautions General   Vital Signs Therapy Vitals Temp: (!) 97.5  F (36.4 C) Temp Source: Oral Pulse Rate: 98 Resp: 18 BP: 114/78 Patient Position (if appropriate): Sitting Oxygen Therapy SpO2: 96 % O2 Device: Room Air Pain Pain Assessment Pain Scale: 0-10 Pain Score: 0-No pain ADL ADL Eating: Set up Where Assessed-Eating: Wheelchair Grooming: Supervision/safety Where Assessed-Grooming: Sitting at sink Upper Body Bathing: Supervision/safety Where Assessed-Upper Body Bathing: Sitting at sink Lower Body Bathing: Minimal assistance Where Assessed-Lower Body Bathing: Sitting at sink Upper Body Dressing: Supervision/safety Where Assessed-Upper Body Dressing: Wheelchair Lower Body Dressing: Moderate assistance Where Assessed-Lower Body Dressing: Wheelchair Toileting: Minimal assistance Where Assessed-Toileting: Bedside Commode Toilet Transfer: Therapist, music Method: Counselling psychologist: Radiographer, therapeutic Method: Unable to assess Social research officer, government: Unable to assess Vision Baseline Vision/History: Wears glasses Wears Glasses: Reading only Patient Visual Report: No change from baseline Eye Alignment: Impaired (comment) Ocular Range of Motion: Within Functional Limits Tracking/Visual Pursuits: Able to track stimulus in all quads without difficulty Saccades: Within functional limits Perception  Perception: Within Functional Limits Praxis Praxis: Intact Cognition Overall Cognitive Status: Impaired/Different from baseline Arousal/Alertness: Awake/alert Selective Attention: Impaired Memory: Impaired Memory Impairment: Decreased recall of new information Problem Solving: Impaired Sequencing: Impaired Safety/Judgment: Appears intact Sensation Sensation Light Touch: Impaired Detail Central sensation comments: B feet impaired sensation. Impaired sensation in median nerve distribution of R UE Light Touch Impaired Details: Impaired RUE;Impaired RLE;Impaired LLE Coordination Gross  Motor Movements are Fluid and Coordinated: No Fine Motor Movements are Fluid and Coordinated: No Coordination and Movement Description: Significant deconditioning and generalized weakness, limited upper extremity coordinationation due to R plexus injury and jerking movements Heel Shin Test: limited hip flexion ROM bilateraaly Motor  Motor Motor: Other (comment) Motor - Skilled Clinical Observations: generalized deconditioning Mobility  Bed Mobility Rolling Right: Independent  with assistive device Rolling Left: Independent with assistive device Supine to Sit: Supervision/Verbal cueing Sit to Supine: Supervision/Verbal cueing Transfers Sit to Stand: Contact Guard/Touching assist Stand to Sit: Contact Guard/Touching assist  Trunk/Postural Assessment  Cervical Assessment Cervical Assessment:  (forward head) Thoracic Assessment Thoracic Assessment:  (rounded shoudlers) Lumbar Assessment Lumbar Assessment:  (post pelvic tilt) Postural Control Postural Control: Deficits on evaluation (post bias- improved since eval but still requries A to correct)  Balance Balance Balance Assessed: Yes Static Sitting Balance Static Sitting - Level of Assistance: 5: Stand by assistance Dynamic Sitting Balance Dynamic Sitting - Level of Assistance: 5: Stand by assistance Static Standing Balance Static Standing - Balance Support: No upper extremity supported Static Standing - Level of Assistance: 4: Min assist Extremity/Trunk Assessment RUE Assessment General Strength Comments: Shoulder AROM to 90 degrees, unable to flex elbow more than 110 degrees d/t pain in elbow. Edema in R hand, especially along median nerve distribution. able to oppose thumb with 2nd and 3rd finger. LUE Assessment LUE Assessment: Within Functional Limits   Tonny Branch 12/09/2020, 4:08 PM

## 2020-12-09 NOTE — Progress Notes (Signed)
Called [employer] for expedited prior authorization on EES--case #[id number].

## 2020-12-09 NOTE — Progress Notes (Signed)
LVAD Coordinator Rounding Note:  HM III LVAD implanted on 10/13/20 by Dr Cyndia Bent under Destination Therapy criteria.  Discharged from [facility] and admitted to [facility] 11/14/20.  Patient lying in bed awake. Denies complaints.    Patient reports he will be discharged after dialysis today. Reports Marcus Johnson has opening on MWF schedule and he will be switched to same.   Confirmed home VAD equipment is in room ready for discharge. Spoke with his son via telephone and went over home equipment, dressing change protocol, clinic follow up. Asked him to call VAD pager if any questions. Son confirmed he had VAD pager # and was comfortable using if he needed to reach [place] at any time. Also told son if he needs help with dressing changes next week, to call us and we can see Marcus Johnson in clinic for dressing change review with family if they want. Son agreed to call if any issues.   VAD Clinic appointment made and discussed with patient and son. Both agree to same and will call if any questions.   Vital signs: Temp: 98.5 HR: 97 Doppler:  Not documented Auto cuff:  116/82 (89) O2 Sat: 100% on RA Wt: 183.6>190.9..........172.8>170.8>169.7>182.7>183.3>183.2>183.2 lbs    LVAD interrogation reveals:  Speed: 5200 Flow: 4.1 Power: 3.7w PI: 4.6 Hematocrit: 30  Alarms: none Events: none  Fixed speed: 5200 Low speed limit: 4900  Drive Line:  Existing VAD dressing C/D/I with anchor intact and accurately applied. The velour is fully implanted at exit site.Continuetwice weekly dressing changesusing silver strip on exit site. Next dressing change will be due 12/12/20. Spoke with son via telephone about dressing change schedule.  Labs:   LDH trend: 199>191>196.............163>175>168>184>172>169>175>167>180>174  INR trend: 1.5>1.7>1.9>2.4>3.2>2.9>3.2>2.9>2.2>2.1>2.3>1.9>1.8>1.9>2.1>1.9>1.9>1.7  Anticoagulation Plan: -INR Goal: 2.0-2.5 -ASA Dose: stopped 30 days post op per protocol  Device:  -   Medtronic single -  Pacing: VVI 40 - Therapies: on 200 bpm  Plan/Recommendations:  1. Call VAD coordinator for equipment or drive line issues 2. Twice weekly dressing changes while using acell. Next dressing due 12/12/20.  3. Possible discharge home today; equipment and dressing supplies are in room Son and patient verbalized understanding same. Discharge follow up scheduled and both agree to same as well.   Marcus Girt RN Faith Coordinator  Office: [phone]  24/7 Pager: [phone]

## 2020-12-09 NOTE — Discharge Summary (Signed)
Physician Discharge Summary  Patient ID: Marcus Johnson. MRN: [id number] DOB/AGE: 06-Jan-1961 60 y.o.  Admit date: 11/14/2020 Discharge date: 12/09/2020  Discharge Diagnoses:  Principal Problem:   Debility Active Problems:   DM (diabetes mellitus) type II controlled with renal manifestation (HCC)   Generalized weakness   Chronic combined systolic and diastolic congestive heart failure (HCC)   LVAD (left ventricular assist device) present (HCC)   Gastroparesis   Orthostatic hypotension   Dependence on hemodialysis (HCC)   Anemia of chronic illness   A-fib (HCC)   Discharged Condition: stable   Significant Diagnostic Studies: DG Abd Portable 1V  Result Date: 11/20/2020 CLINICAL DATA:  Nausea and vomiting EXAM: PORTABLE ABDOMEN - 1 VIEW COMPARISON:  10/17/2020 FINDINGS: Changes consistent with LVAD are again seen and stable. Feeding catheter has been removed in the interval. Defibrillator is noted. Scattered large and small bowel gas is noted. Retained fecal material is seen consistent with a degree of mild constipation. No obstructive changes are noted. IMPRESSION: Changes of mild constipation.  No obstruction is seen. Electronically Signed   By: Inez Catalina M.D.   On: 11/20/2020 17:20    Labs:  Basic Metabolic Panel: Recent Labs  Lab 12/03/20 0317 12/04/20 0532 12/05/20 0401 12/06/20 0414 12/07/20 0451 12/08/20 0354 12/09/20 0300  NA 137 135 136 135 137 136 135  K 4.4 4.3 4.3 4.4 4.1 4.2 3.6  CL 99 98 99 99 99 98 99  CO2 28 28 27 25 27 26 28   GLUCOSE 118* 92 126* 131* 99 115* 109*  BUN 39* 25* 39* 57* 38* 49* 32*  CREATININE 5.22* 3.89* 5.55* 6.49* 4.61* 5.56* 3.96*  CALCIUM 9.1 9.2 9.3 9.3 9.2 9.5 9.2  MG 2.1 2.0 2.3 2.2 2.0 2.2 1.9  PHOS 3.3 3.2 4.0 4.1 3.5 4.1 3.4    CBC: CBC Latest Ref Rng & Units 12/08/2020 12/03/2020 11/29/2020  WBC 4.0 - 10.5 K/uL 8.1 8.6 9.7  Hemoglobin 13.0 - 17.0 g/dL 8.9(L) 9.2(L) 9.1(L)  Hematocrit 39.0 - 52.0 % 30.0(L) 31.2(L)  30.3(L)  Platelets 150 - 400 K/uL 194 208 198    CBG: Recent Labs  Lab 12/08/20 1302 12/08/20 1649 12/08/20 2101 12/09/20 0619 12/09/20 1602  GLUCAP 110* 164* 145* 108* 109*    INR:  Lab Results  Component Value Date   INR 1.7 (H) 12/09/2020   INR 1.9 (H) 12/08/2020   INR 1.9 (H) 12/07/2020   Brief HPI:   Marcus Jerger. is a 60 y.o. male with history of T2DM with retinopathy, CABG, ICM with EF 20 to 20%//NOBSJGG systolic CHF/ICD, CKD, left foot wound, COVID-19 infection 01/22; who was admitted on 09/28/2020 with acute on chronic CHF as well as episodes of A. fib/a flutter for ICD interrogation.  Patient had declined LVAD or cardiac transplant in the past.  He was started on milrinone for diuresis and developed hypotension requiring transfer to ICU as well as initiation of dobutamine and norepinephrine.  Impella was placed due to RV failure with development of RUE neuropathic pain and weakness due to stretch injury.  He was agreeable to undergo LVAD placement on 10/13/2020 by Dr. Cyndia Bent.  He developed acute on chronic renal failure requiring CRRT without recovery and was felt to have progressed to ESRD is hemodialysis dependent.  He developed sacral decubitus that continued to worsen and was started on hydrotherapy and Santyl with improvement.  Tunneled catheter was placed by Dr. Laurence Ferrari on 04/01.  He had issues with hypotension, presyncope jerking  episodes as well as residual deficits on 04/03.  CT of head done which was negative for acute changes.  Chest x-ray showed question of LLL atelectasis versus infiltrate and was treated with pulmonary hygiene.  His mentation was improving, medical issues were resolving however patient continued to be limited by debility with weakness impacting ADLs and mobility.  CIR was recommended due to functional decline.   Hospital Course: Marcus Johnson. was admitted to rehab 11/14/2020 for inpatient therapies to consist of PT and OT at  least three hours five days a week. Past admission physiatrist, therapy team and rehab RN have worked together to provide customized collaborative inpatient rehab. His hemodialysis was been scheduled at the end of the day to help with tolerance of activity. He was noted to have issues with weakness on days post hemodialysis and was advised to take brief rest and pace himself after d/c. Marland Kitchen He did not want perm cath placed during his stay as he continues to hope for renal recovery.  His blood pressures were monitored on TID basis and he continued to have issues with orthostatic hypotension.  Dizziness felt to be like BPPV at times, was treated with Epley and Scopolamine patch was added help manage symptoms.   Revatio was discontinued and teds were added for BP support.  Amiodarone was discontinued with resolution of tremors.  He continued to have issues with nausea and vomiting felt to be due to gastroparesis and has resolved with titration of Reglan and addition of erythromycin. Sacral decubiti improved with hydrotherapy therefore this was discontinued and damp to dry dressing changes were ordered per WOC input.  Sacral decubitus resolved with improvement in activity as well as nutritional status. Pharmacy has been assisting with Coumadin management and dosing.  INR showed slight drop to 1.7 at discharge and he  received 4 mg prior to discharge with recommendations to continue 3 mg daily.  Home health nurse to draw pro time on Monday 05/09 with results to Coumadin clinic.  His diabetes has been monitored with ac/hs CBG checks and SSI was use prn for tighter BS control.  He has had issues with hypoglycemia and Lantus dose has been decreased  to 8 units with reasonable control.  Acute on chronic anemia has been treated with Nulecit MWF and weekly Aranesp.  Serial CBC shows H&H to be relatively stable.  Low dose gabapentin has been effective in managing RUE neuropathic pain and he is having some improvement in motor  function. LVAD dressing changes have been performed by LVAD coordinator and family education completed on dressing changes and therapy has been working with patient on education on Home VAD equipment use.  He has had improvement in functional status and endurance during his stay. Supervision is recommended and he will continue to receive follow up [place] and [place] by [facility] after discharge.     Rehab course: During patient's stay in rehab weekly team conferences were held to monitor patient's progress, set goals and discuss barriers to discharge. At admission, patient required mod assist with mobility and with basic self-care tasks.  He has had improvement in activity tolerance, balance, postural control as well as ability to compensate for deficits. He requires supervision for UB care and min to mod assist with LB care. He requires supervision with verbal cues for transfers and to ambulate 150 feet with rolling walker. Family education has been completed.   Disposition: Home  Diet: Renal/carb modified with 1200 cc FR/day   Special  Instructions: 1.  Home health RN to draw pro time on 05/9 with results to heart failure clinic. 2.  Continue sternal precautions till cleared.  3. Monitor BS ac/hs and follow up with PCP for further adjustment.    Allergies as of 12/09/2020      Reactions   Meclizine Hcl Anaphylaxis, Swelling   Ivabradine Nausea Only      Medication List    STOP taking these medications   (feeding supplement) PROSource Plus liquid   amiodarone 200 MG tablet Commonly known as: PACERONE   Darbepoetin Alfa 100 MCG/0.5ML Sosy injection Commonly known as: ARANESP   dextrose 50 % solution   feeding supplement (NEPRO CARB STEADY) Liqd   ferric gluconate 125 mg in sodium chloride 0.9 % 100 mL   heparin 1000 unit/mL Soln injection   heparin 25000 UT/250ML infusion   insulin aspart 100 UNIT/ML injection Commonly known as: novoLOG   lidocaine (PF) 1 % Soln  injection Commonly known as: XYLOCAINE   lidocaine-prilocaine cream Commonly known as: EMLA   ondansetron 4 MG/2ML Soln injection Commonly known as: ZOFRAN   pentafluoroprop-tetrafluoroeth Aero Commonly known as: GEBAUERS   sodium chloride 0.9 % infusion   sodium chloride flush 0.9 % Soln Commonly known as: NS   sorbitol 70 % Soln   traZODone 50 MG tablet Commonly known as: DESYREL     TAKE these medications   acetaminophen 325 MG tablet Commonly known as: TYLENOL Take 1-2 tablets (325-650 mg total) by mouth every 4 (four) hours as needed for mild pain. What changed:   medication strength  how much to take  when to take this   erythromycin 250 MG tablet Commonly known as: E-MYCIN Take 1 tablet (250 mg total) by mouth 3 (three) times daily before meals.   gabapentin 100 MG capsule Commonly known as: NEURONTIN Take 1 capsule (100 mg total) by mouth at bedtime.   insulin glargine 100 UNIT/ML injection Commonly known as: LANTUS Inject 0.08 mLs (8 Units total) into the skin daily. What changed: how much to take   metoCLOPramide 10 MG tablet Commonly known as: REGLAN Take 1 tablet (10 mg total) by mouth 3 (three) times daily before meals. What changed:   medication strength  how much to take   midodrine 10 MG tablet Commonly known as: PROAMATINE Take 1 tablet (10 mg total) by mouth 3 (three) times daily with meals. What changed:   medication strength  how much to take   multivitamin Tabs tablet Take 1 tablet by mouth at bedtime.   Muscle Rub 10-15 % Crea Apply 1 application topically 2 (two) times daily. To right shoulder--avoid IV site   oxyCODONE 5 MG immediate release tablet Commonly known as: Oxy IR/ROXICODONE Take 1 tablet (5 mg total) by mouth every 6 (six) hours as needed for severe pain. What changed:   how much to take  when to take this  reasons to take this   pantoprazole 40 MG tablet Commonly known as: PROTONIX Take 1 tablet  (40 mg total) by mouth daily.   polyethylene glycol powder 17 GM/SCOOP powder Commonly known as: GLYCOLAX/MIRALAX Dissolve 1 capful in water and drink 2 (two) times daily.   rosuvastatin 10 MG tablet Commonly known as: CRESTOR Take 1 tablet (10 mg total) by mouth daily. What changed:   medication strength  how much to take   scopolamine 1 MG/3DAYS Commonly known as: TRANSDERM-SCOP Place 1 patch (1.5 mg total) onto the skin every 3 (three) days.   Senexon-S  8.6-50 MG tablet Generic drug: senna-docusate Take 2 tablets by mouth 2 (two) times daily. What changed:   when to take this  Another medication with the same name was removed. Continue taking this medication, and follow the directions you see here.   vitamin C 1000 MG tablet Take 1 tablet (1,000 mg total) by mouth daily. Start taking on: Dec 10, 2020   warfarin 3 MG tablet Commonly known as: COUMADIN Take 1 tablet (3 mg total) by mouth daily at 4 PM. What changed:   medication strength  how much to take  when to take this       Follow-up Information    Meredith Staggers, MD. Call.   Specialty: Physical Medicine and Rehabilitation Why: as needed Contact information: [address] [place] [address] [phone]        [name], MD Follow up.   Specialty: Family Medicine Contact information: [place] [address] [phone]        Deboraha Sprang, MD .   Specialty: Cardiology Contact information: [phone] [address] [place] [address] [phone]        [place] .   Specialty: Cardiovascular Intensive Care Contact information: [address] [id number] Danice Goltz [place] [address] [place] Follow up on 12/20/2020.   Specialty: Cardiology Why: at 900. VAD Clinic . Garage Code [id number] Contact information: [address] [id number] [place] [phone]              Signed: Bary Leriche 12/09/2020, 7:34 PM

## 2020-12-09 NOTE — Progress Notes (Addendum)
Patient ID: Marcus Thain., male   DOB: 1961/03/24, 60 y.o.   MRN: [id number]  Per EMR, pt to discharge to home after dialysis today since he now has a dialysis seat. [facility] MWF 12:20pm. He needs to arrive to his appointments at 12:00pm. On Monday, 5/9/2,  He needs to get to the clinic at 11:00am on his first day in order to complete intake paperwork prior to his first treatment.   SW spoke with [name] to discuss above with waiting on pt to be taken to dialysis. He reports that his schedule is flexible and he will still take pt home today.  SW spoke with [name] about DME: [facility] recommended and if he was aware TTB was ordered. States he spoke with [facility] and declined TTB since he already ordered one. SW informed on [facility] being ordered.   SW updated Marcus Johnson/[facility] on pt dialysis schedule, and asked for follow-up with pt or pt son to confirm they will not require transportation services.  *Reports family can call the reservations department [phone] anytime to arrange transportation.   Marcus Johnson, MSW, [place] Office: [phone] Cell: [phone] Fax: [phone]

## 2020-12-09 NOTE — Progress Notes (Addendum)
Patient ID: Marcus Beckford., male   DOB: 10/25/1960, 60 y.o.   MRN: [id number]     Advanced Heart Failure Rounding Note  PCP-Cardiologist: None   Subjective:    - 2/27 Milrinone switched to dobutamine 5 mcg.  - 2/28 Moved to ICU. Dobutamine increased to 7.5 mcg and Norepi 3 mcg added. Diuretics held.  - 3/1 Impella 5.5 placed.  - 3/4 Teeth removed - 3/10 HM3 LVAD placed - 3/13 VAD speed increased to 5500. [name] out  - 3/14 Nephrology consulted. Given 1UPRBCs  - 3/15 Started CRRT. Given 1UPRBCs.  - 3/23 VAD Speed turned down to 5300  - 3/23 CRRT and Milrinone Stopped. Back in Afib - 3/24 CRRT restarted.  - 3/27 CVVH off.  - 3/28 iHD - 3/29 iHD. Walked 75 feet!! - 3/20 Ramp Echo: Fixed speed: 5200 Low speed limit: 4900 - 4/3 S/P RIJ HD catheter.  - 4/10 60th B-day!  - 4/11 Transferred to CIR. Back on heparin drip.  - 4/26 amio stopped due to tremors. - 4/27 Erythromycin     INR 1.7 LDH 174   Wants to go home !   LVAD Interrogation HM 3: Speed: 5250 Flow: 4  PI: 5 Power: 4 no PI events  VAD interrogated personally. Parameters stable.   Objective:   Weight Range: 83.1 kg Body mass index is 25.92 kg/m.   Vital Signs:   Temp:  [97.9 F (36.6 C)-98.6 F (37 C)] 97.9 F (36.6 C) (05/06 1135) Pulse Rate:  [88-97] 97 (05/06 0623) Resp:  [14-20] 16 (05/06 1230) BP: (103-125)/(82-103) 125/95 (05/06 1230) SpO2:  [99 %-100 %] 100 % (05/06 1135) Last BM Date: 12/07/20  Weight change: Filed Weights   12/06/20 0437 12/07/20 0432  Weight: 83.1 kg 83.1 kg    Intake/Output:   Intake/Output Summary (Last 24 hours) at 12/09/2020 1243 Last data filed at 12/09/2020 0744 Gross per 24 hour  Intake 476 ml  Output 100 ml  Net 376 ml    Maps 80s   Physical Exam  Physical Exam: Seen in HD  GENERAL: No acute distress. HEENT: normal  NECK: Supple, JVP flat   .  2+ bilaterally, no bruits.  No lymphadenopathy or thyromegaly appreciated.  CARDIAC:  Mechanical heart sounds  with LVAD hum present.  LUNGS:  Clear to auscultation bilaterally.  ABDOMEN:  Soft, round, nontender, positive bowel sounds x4.     LVAD exit site:   Dressing dry and intact.  No erythema or drainage.  Stabilization device present and accurately applied.  EXTREMITIES:  Warm and dry, no cyanosis, clubbing, rash or edema  NEUROLOGIC:  Alert and oriented x 3.    No aphasia.  No dysarthria.  Affect pleasant.      Labs    CBC Recent Labs    12/08/20 0354  WBC 8.1  HGB 8.9*  HCT 30.0*  MCV 88.2  PLT 222   Basic Metabolic Panel Recent Labs    12/08/20 0354 12/09/20 0300  NA 136 135  K 4.2 3.6  CL 98 99  CO2 26 28  GLUCOSE 115* 109*  BUN 49* 32*  CREATININE 5.56* 3.96*  CALCIUM 9.5 9.2  MG 2.2 1.9  PHOS 4.1 3.4   Liver Function Tests Recent Labs    12/08/20 0354 12/09/20 0300  ALBUMIN 2.8* 2.8*   No results for input(s): LIPASE, AMYLASE in the last 72 hours. Cardiac Enzymes No results for input(s): CKTOTAL, CKMB, CKMBINDEX, TROPONINI in the last 72 hours.  BNP: BNP (  last 3 results) Recent Labs    11/24/20 0454 12/01/20 0000 12/08/20 0354  BNP 723.8* 740.4* 777.5*    ProBNP (last 3 results) No results for input(s): PROBNP in the last 8760 hours.   D-Dimer No results for input(s): DDIMER in the last 72 hours. Hemoglobin A1C No results for input(s): HGBA1C in the last 72 hours. Fasting Lipid Panel No results for input(s): CHOL, HDL, LDLCALC, TRIG, CHOLHDL, LDLDIRECT in the last 72 hours. Thyroid Function Tests No results for input(s): TSH, T4TOTAL, T3FREE, THYROIDAB in the last 72 hours.  Invalid input(s): FREET3  Other results:   Imaging    No results found.   Medications:     Scheduled Medications: . (feeding supplement) PROSource Plus  30 mL Oral BID BM  . vitamin C  1,000 mg Oral Daily  . Chlorhexidine Gluconate Cloth  6 each Topical BID  . Chlorhexidine Gluconate Cloth  6 each Topical Q0600  . darbepoetin (ARANESP) injection -  DIALYSIS  100 mcg Intravenous Q Tue-HD  . erythromycin  250 mg Oral TID AC  . feeding supplement (NEPRO CARB STEADY)  237 mL Oral BID BM  . gabapentin  100 mg Oral QHS  . insulin aspart  0-15 Units Subcutaneous TID WC  . insulin aspart  0-5 Units Subcutaneous QHS  . insulin glargine  8 Units Subcutaneous Daily  . metoCLOPramide  10 mg Oral TID AC & HS  . midodrine  10 mg Oral TID WC  . multivitamin  1 tablet Oral QHS  . Muscle Rub   Topical BID  . pantoprazole  40 mg Oral Daily  . polyethylene glycol  17 g Oral BID  . rosuvastatin  10 mg Oral Daily  . scopolamine  1 patch Transdermal Q72H  . senna-docusate  2 tablet Oral BID  . sorbitol  30 mL Oral Daily  . warfarin  4 mg Oral Once  . Warfarin - Pharmacist Dosing Inpatient   Does not apply q1600    Infusions: . sodium chloride 10 mL (11/17/20 1430)  . sodium chloride 250 mL (12/01/20 1806)    PRN Medications: sodium chloride, sodium chloride, acetaminophen, alteplase, alum & mag hydroxide-simeth, bisacodyl, dextrose, diphenhydrAMINE, guaiFENesin-dextromethorphan, heparin, lidocaine-prilocaine, ondansetron (ZOFRAN) IV, oxyCODONE, pentafluoroprop-tetrafluoroeth, prochlorperazine **OR** prochlorperazine **OR** prochlorperazine, sodium chloride flush, sodium phosphate, white petrolatum    Assessment/Plan   1. Acute on chronic systolic HF -> cardiogenic shock:  ICM. Has Medtronic ICD. EF 15%. Low output persisted despite milrinone and then dobutamine with rise in creatinine to 2.5. Impella 5.5 placed 3/1.  Creatinine improved to 1.7 and HM3 LVAD was placed on 3/10. Developed post-op renal failure requiring CVVH. Now off milrinone and epinephrine. Last echo showed moderate RV enlargement with mildly decreased RV function and VAD speed decreased to 5300 due to left-sided septum. CRRT stopped 3/23 but restarted 3/24. CVVH stopped again on 3/27. Ramp echo 3/30 with speed decreased to 5200. Tolerating iHD for volume management.   - maps  stable on midodrine. - Continue Midodrine 10 mg tid due to dizzinness/orthostasis (resolved for the most part).    - Continue compression stockings.   - HD for volume management  2. LVAD: 3/13 VAD speed increased to 5500. 3/23 speed decreased to 5300 and decreased to 5200 on 3/30. VAD interrogated personally. Parameters stable. LDH 174, INR 1.7 today  - On warfarin, on ASA 81 daily.  - DL site ok 3. CAD s/p CABG 2002:  Last cath in 6/19 with patent grafts. No s/s angina- Continue Crestor  40 mg daily.  4. AKI on CKD Stage 3b: Suspect that this is a combination of cardiorenal syndrome and diabetic nephropathy and possibly ATN. Developed post-op AKI. Started on CRRT 3/15. CVVH stopped 3/23 but restarted 3/24. Now getting iHD, looks like he will be HD-dependent.  Has tunneled catheter.  - Plan for HD M-W-F  - Renal and VVS discussing, but with continuous flow LVAD, think AV fistula would be less likely to mature.  Patient wants to wait on getting AV graft until some point after discharge. No change - He now has [facility], first session planned Mon 5/9. Appreciate nephrology SW assistance  5. Atrial flutter/fibrillation: s/p DC-CV 08/21/18.  - 4/26 Off amio.  6. Wound R Foot:  Resolved.  7. Right arm pain: Likely neuropathic from stretch of brachial plexus with Impella placement.  - Continue gabapentin. Able to move right hand a little better.  8. ID:  WBCs normal.  9. DM: History of very poor control but hgbA1c trending down. Most recent 7.7%.  Insulin drip stopped 3/24. Blood glucose controlled on reduced Lantus and  sliding scale insulin to moderate.  - Diabetes coordinators have seen 10. Anemia: Stable CBC. hGB 9.2 on 4/30 11. Deconditioning - Continue to work with CIR. Appreciate their assistance  12. Unstageable Pressure Ulcer on buttocks: Much improved 100% granulation.  - Hydrotherapy completed - Continue repositioning 13. NSVT: Stopped amio with  tremors.  14. Dizziness: Still with symptoms that seem like BPPV at times. Has had Epley maneuvers.  15. Suspected Gastroparesis. Nausea resolved on Reglan and erythromycin.   He now has seat at outpatient HD at [facility] MWF, first session planned Mon 5/9. Appreciate nephrology SW's assistance.   D/C today. Family teaching completed for VAD. F/U in the VAD clinic set up for next week.   Amy Clegg NP-C  12/09/2020  Stable for home today, d/c teaching for LVAD done.  LVAD parameters stable.  Close followup in LVAD clinic, will manage INR in LVAD clinic.   Loralie Champagne 12/09/2020

## 2020-12-09 NOTE — Progress Notes (Addendum)
PROGRESS NOTE   Subjective/Complaints: Had a good night. No complaints this morning. For morning HD today. Wants to go home afterward  ROS: Patient denies fever, rash, sore throat, blurred vision, nausea, vomiting, diarrhea, cough, shortness of breath or chest pain,  headache, or mood change.   Objective:   No results found. Recent Labs    12/08/20 0354  WBC 8.1  HGB 8.9*  HCT 30.0*  PLT 194   Recent Labs    12/08/20 0354 12/09/20 0300  NA 136 135  K 4.2 3.6  CL 98 99  CO2 26 28  GLUCOSE 115* 109*  BUN 49* 32*  CREATININE 5.56* 3.96*  CALCIUM 9.5 9.2    Intake/Output Summary (Last 24 hours) at 12/09/2020 1016 Last data filed at 12/09/2020 0744 Gross per 24 hour  Intake 476 ml  Output 1600 ml  Net -1124 ml     Pressure Injury 10/16/20 Coccyx Mid Stage 3 -  Full thickness tissue loss. Subcutaneous fat may be visible but bone, tendon or muscle are NOT exposed. Blister that has drained, 2 cm X 4.5 cm (Active)  10/16/20 0800  Location: Coccyx  Location Orientation: Mid  Staging: Stage 3 -  Full thickness tissue loss. Subcutaneous fat may be visible but bone, tendon or muscle are NOT exposed. (as per WOC note on 11/14/20)  Wound Description (Comments): Blister that has drained, 2 cm X 4.5 cm  Present on Admission: No     Pressure Injury 10/16/20 Coccyx Right Stage 2 -  Partial thickness loss of dermis presenting as a shallow open injury with a red, pink wound bed without slough. blister that has drained, 0.5 cm X 0.5 cm (Active)  10/16/20 0800  Location: Coccyx  Location Orientation: Right  Staging: Stage 2 -  Partial thickness loss of dermis presenting as a shallow open injury with a red, pink wound bed without slough.  Wound Description (Comments): blister that has drained, 0.5 cm X 0.5 cm  Present on Admission: No    Physical Exam: Vital Signs Blood pressure 116/82, pulse 97, temperature 98.5 F (36.9 C),  resp. rate 18, height 5' 10.5" (1.791 m), weight 83.1 kg, SpO2 100 %. Constitutional: No distress . Vital signs reviewed. HEENT: EOMI, oral membranes moist Neck: supple Cardiovascular: LVAD hum    Respiratory/Chest: CTA Bilaterally without wheezes or rales. Normal effort    GI/Abdomen: BS +, non-tender, non-distended Ext: no clubbing, cyanosis, or edema Psych: pleasant and cooperative, no anxiety this morning Skin:  cath right shoulder in place.LVAD site dry, sacral wound not visualized Neuro: Alert and oriented x 3. Normal insight and awareness. STM deficits. Normal language and speech. Cranial nerve exam unremarkable  Sensory exam is normal. Reflexes are 2+ in all 4's. Fine motor coordination is intact. R>L UE tremors mild today. Motor function is grossly 4/5 with tremor. RUE  4/5.  LE motor 4/5 with more weakness proximally--stable to improved exam. Musculoskeletal:  Mild RUE discomfort with AROM but much improved   Assessment/Plan: 1. Functional deficits which require 3+ hours per day of interdisciplinary therapy in a comprehensive inpatient rehab setting.  Physiatrist is providing close team supervision and 24 hour management of active medical  problems listed below.  Physiatrist and rehab team continue to assess barriers to discharge/monitor patient progress toward functional and medical goals  Care Tool:  Bathing  Bathing activity did not occur:  (time constraints) Body parts bathed by patient: Left arm,Abdomen,Chest,Front perineal area,Right upper leg,Left upper leg,Face,Right arm,Buttocks   Body parts bathed by helper: Right lower leg,Left lower leg     Bathing assist Assist Level: Minimal Assistance - Patient > 75%     Upper Body Dressing/Undressing Upper body dressing Upper body dressing/undressing activity did not occur (including orthotics): Environmental limitations (PICC running) What is the patient wearing?: Pull over shirt    Upper body assist Assist Level: Minimal  Assistance - Patient > 75%    Lower Body Dressing/Undressing Lower body dressing      What is the patient wearing?: Hospital gown only     Lower body assist Assist for lower body dressing: Minimal Assistance - Patient > 75%     Toileting Toileting Toileting Activity did not occur (Clothing management and hygiene only): N/A (no void or bm)  Toileting assist Assist for toileting: Minimal Assistance - Patient > 75%     Transfers Chair/bed transfer  Transfers assist  Chair/bed transfer activity did not occur: Safety/medical concerns  Chair/bed transfer assist level: Supervision/Verbal cueing Chair/bed transfer assistive device: Programmer, multimedia   Ambulation assist   Ambulation activity did not occur: Safety/medical concerns  Assist level: Supervision/Verbal cueing Assistive device: Walker-rolling Max distance: 150   Walk 10 feet activity   Assist  Walk 10 feet activity did not occur: Safety/medical concerns  Assist level: Supervision/Verbal cueing Assistive device: Walker-rolling   Walk 50 feet activity   Assist Walk 50 feet with 2 turns activity did not occur: Safety/medical concerns  Assist level: Supervision/Verbal cueing Assistive device: Walker-rolling    Walk 150 feet activity   Assist Walk 150 feet activity did not occur: Safety/medical concerns  Assist level: Supervision/Verbal cueing Assistive device: Walker-rolling    Walk 10 feet on uneven surface  activity   Assist Walk 10 feet on uneven surfaces activity did not occur: Safety/medical concerns   Assist level: Contact Guard/Touching assist     Wheelchair     Assist Will patient use wheelchair at discharge?: No (Per PT long term goals)   Wheelchair activity did not occur: Safety/medical concerns         Wheelchair 50 feet with 2 turns activity    Assist    Wheelchair 50 feet with 2 turns activity did not occur: Safety/medical concerns        Wheelchair 150 feet activity     Assist  Wheelchair 150 feet activity did not occur: Safety/medical concerns       Blood pressure 116/82, pulse 97, temperature 98.5 F (36.9 C), resp. rate 18, height 5' 10.5" (1.791 m), weight 83.1 kg, SpO2 100 %.  Medical Problem List and Plan: 1.Functional and mobility deficitssecondary to debility after acute on chronic CHF with subsequent LVAD placement -patient [place] home today 5/6 after HD, has dialysis seat as outpt   -scopolamine patch for ?vestibular symptoms-can go home on this 2. Chronic systolic CHF s/p LVAD 3:  -DVT/anticoagulation:Pharmaceutical:Continue Coumadin per pharmacy -antiplatelet therapy: ASA 3.Right arm neuropathic pain/brachial plexus injury/Pain Management:Tylenol or oxycodone prn.  -- gabapentin 200 mgat bedtime with some benefit -sling prn for RUE support and comfort  -expect recovery of motor/sensory function in RUE  5/6 well controlled, continue current regimen.  4. Mood:LCSW to follow for evaluation and support. -  antipsychotic agents: N/A 5. Neuropsych: This patientiscapable of making decisions on hisown behalf. 6. Skin/Wound Care:Optimize nutritional status--continue Nephro and prosource.  --Sacral decubitus: Air mattress.    -  hydrogel/gauze daily   --Right foot wound resolved.  7. Fluids/Electrolytes/Nutrition:Strict I/O. Continue to monitor labs with HD? 8. Chronic systolic CHF s/p LVAD 3:  --Cardiology to follow and manage cardiac issues.  -5/6 weights stable, volume mgt per cards/nephro--no weight today yet Filed Weights   12/06/20 0437 12/07/20 0432  Weight: 83.1 kg 83.1 kg     -off revatio   -TEDS, acclimation   - continue midodrine 10mg  tid (give dose immediately after HD to prop up bp for stairs/activity) 9. Acute on chronic anemia: Continue to monitor  H/H/ monitor for signs of bleeding.  --Was transfused with multiple units PRBC. --ON Nulecit Tu/Th/Sa  -transfuse as needed in HD hgb holding at 8.6 10. T2DM: Hgb A1c- monitor BS ac/hs.BS reasonable on current intake --continue Lantus in am with SSI for elevated BS.    5/2   improved after lantus decreased to 8u qam     CBG (last 3)  Recent Labs    12/08/20 1649 12/08/20 2101 12/09/20 0619  GLUCAP 164* 145* 108*    5/6 reasonable control with lantus 10u qam   11. ESRD: Hyperkalemia being managed with lokelma/HD. -see above 12. Mild leucocytosis: Resolved  13. A fib: NSR, HR remains controlled on Amiodarone, continue 14. Nausea: likely d/t to peristalsis/gastroparesis---improved  -continue reglan 10mg  AC/HS   -amiodarone reduced per cards   -had bm yesterday. Asked him to try for bm QOD.    LOS: 25 days A FACE TO [place] 12/09/2020, 10:16 AM

## 2020-12-09 NOTE — Discharge Instructions (Addendum)
Inpatient Rehab Discharge Instructions  Marcus [facility]. Discharge date and time: 12/09/20 Activities/Precautions/ Functional Status: Activity: no lifting, driving, or strenuous exercise till cleared by MD Diet: Heart healthy, renal diet--1200 fluid per day.  Wound Care: keep wound clean and dry    Functional status:  ___ No restrictions     ___ Walk up steps independently _X__ 24/7 supervision/assistance   ___ Walk up steps with assistance ___ Intermittent supervision/assistance  ___ Bathe/dress independently ___ Walk with walker     _X__ Bathe/dress with assistance ___ Walk Independently    ___ Shower independently ___ Walk with assistance    ___ Shower with assistance _X__ No alcohol     ___ Return to work/school ________    COMMUNITY REFERRALS UPON DISCHARGE:    Home Health:   PT     OT    RN                [facility]   Phone: [phone] *Please expect follow-up within 2-3 days from discharge to schedule your home visit. If you do not receive follow-up, be sure to contact the site directly.   Medical Equipment/Items Ordered: rolling walker, transport chair ( to be shipped to the home), 3in1 bedside commode, tub transfer bench (ordered already)                                                 Agency/Supplier: [facility] PATIENT/FAMILY: You have been pre-certified for [facility] transportation- reservations department [phone] due to being a current dialysis patient. Please be sure to follow-up with [facility] to discuss transportation any time it is needed  Special Instructions: 1. Continue sternal precautions.  2. Take rest breaks but continue to work on building endurance.  3. You need to take Senna twice a day and use miralax if needed for no BM in 2-3 days or the opposite. If you keep refusing laxative, you will continue to have issues with nausea/constipation.  3. Monitor blood sugars before meals and  at bedtime. Follow up with PCP for adjustment in insulin.  4. Erythromycin copay is $90/- pharmacy at hospital only had a week supply and recommend filling it at your local pharmacy.   My questions have been answered and I understand these instructions. I will adhere to these goals and the provided educational materials after my discharge from the hospital.  Patient/Caregiver Signature _______________________________ Date __________  Clinician Signature _______________________________________ Date __________  Please bring this form and your medication list with you to all your follow-up doctor's appointments.    Information on my medicine - Coumadin   (Warfarin)  This medication education was reviewed with me or my healthcare representative as part of my discharge preparation.  The pharmacist that spoke with me during my hospital stay was:  Marcus Johnson, [facility]  Why was Coumadin prescribed for you? Coumadin was prescribed for you because you have a blood clot or a medical condition that can cause an increased risk of forming blood clots. Blood clots can cause serious health problems by blocking the flow of blood to the heart, lung, or brain. Coumadin can prevent harmful blood clots from forming. As a reminder your indication for Coumadin is: LVAD  What test will check on my response to Coumadin? While on Coumadin (warfarin) you will need  to have an INR test regularly to ensure that your dose is keeping you in the desired range. The INR (international normalized ratio) number is calculated from the result of the laboratory test called prothrombin time (PT).  If an INR APPOINTMENT HAS NOT ALREADY BEEN MADE FOR YOU please schedule an appointment to have this lab work done by your health care provider within 7 days. Your INR goal is 2-2.5.  Ask your health care provider during an office visit what your goal INR is.  What  do you need to  know  About  COUMADIN? Take Coumadin (warfarin)  exactly as prescribed by your healthcare provider about the same time each day.  DO NOT stop taking without talking to the doctor who prescribed the medication.  Stopping without other blood clot prevention medication to take the place of Coumadin may increase your risk of developing a new clot or stroke.  Get refills before you run out.  What do you do if you miss a dose? If you miss a dose, take it as soon as you remember on the same day then continue your regularly scheduled regimen the next day.  Do not take two doses of Coumadin at the same time.  Important Safety Information A possible side effect of Coumadin (Warfarin) is an increased risk of bleeding. You should call your healthcare provider right away if you experience any of the following: ? Bleeding from an injury or your nose that does not stop. ? Unusual colored urine (red or dark brown) or unusual colored stools (red or black). ? Unusual bruising for unknown reasons. ? A serious fall or if you hit your head (even if there is no bleeding).  Some foods or medicines interact with Coumadin (warfarin) and might alter your response to warfarin. To help avoid this: ? Eat a balanced diet, maintaining a consistent amount of Vitamin K. ? Notify your provider about major diet changes you plan to make. ? Avoid alcohol or limit your intake to 1 drink for women and 2 drinks for men per day. (1 drink is 5 oz. wine, 12 oz. beer, or 1.5 oz. liquor.)  Make sure that ANY health care provider who prescribes medication for you knows that you are taking Coumadin (warfarin).  Also make sure the healthcare provider who is monitoring your Coumadin knows when you have started a new medication including herbals and non-prescription products.  Coumadin (Warfarin)  Major Drug Interactions  Increased Warfarin Effect Decreased Warfarin Effect  Alcohol (large quantities) Antibiotics (esp. Septra/Bactrim, Flagyl, Cipro) Amiodarone (Cordarone) Aspirin  (ASA) Cimetidine (Tagamet) Megestrol (Megace) NSAIDs (ibuprofen, naproxen, etc.) Piroxicam (Feldene) Propafenone (Rythmol SR) Propranolol (Inderal) Isoniazid (INH) Posaconazole (Noxafil) Barbiturates (Phenobarbital) Carbamazepine (Tegretol) Chlordiazepoxide (Librium) Cholestyramine (Questran) Griseofulvin Oral Contraceptives Rifampin Sucralfate (Carafate) Vitamin K   Coumadin (Warfarin) Major Herbal Interactions  Increased Warfarin Effect Decreased Warfarin Effect  Garlic Ginseng Ginkgo biloba Coenzyme Q10 Green tea St. John's wort    Coumadin (Warfarin) FOOD Interactions  Eat a consistent number of servings per week of foods HIGH in Vitamin K (1 serving =  cup)  Collards (cooked, or boiled & drained) Kale (cooked, or boiled & drained) Mustard greens (cooked, or boiled & drained) Parsley *serving size only =  cup Spinach (cooked, or boiled & drained) Swiss chard (cooked, or boiled & drained) Turnip greens (cooked, or boiled & drained)  Eat a consistent number of servings per week of foods MEDIUM-HIGH in Vitamin K (1 serving = 1 cup)  Asparagus (cooked, or boiled &  drained) Broccoli (cooked, boiled & drained, or raw & chopped) Brussel sprouts (cooked, or boiled & drained) *serving size only =  cup Lettuce, raw (green leaf, endive, romaine) Spinach, raw Turnip greens, raw & chopped   These websites have more information on Coumadin (warfarin):  FailFactory.se; VeganReport.com.au;

## 2020-12-09 NOTE — Progress Notes (Signed)
Right arm triple lumen PICC removed per protocol per MD order. Manual pressure applied for 5 mins. No bleeding or swelling noted. Instructed patient to remain in bed for thirty mins. Educated patient about S/S of infection and when to call MD; no heavy lifting or pressure on right side for 24 hours; keep dressing dry and intact for 24 hours. Pt verbalized comprehension.

## 2020-12-09 NOTE — Plan of Care (Signed)
  Problem: RH Balance Goal: LTG Patient will maintain dynamic sitting balance (PT) Description: LTG:  Patient will maintain dynamic sitting balance with assistance during mobility activities (PT) Outcome: Completed/Met Goal: LTG Patient will maintain dynamic standing balance (PT) Description: LTG:  Patient will maintain dynamic standing balance with assistance during mobility activities (PT) Outcome: Completed/Met   Problem: Sit to Stand Goal: LTG:  Patient will perform sit to stand with assistance level (PT) Description: LTG:  Patient will perform sit to stand with assistance level (PT) Outcome: Completed/Met   Problem: RH Bed Mobility Goal: LTG Patient will perform bed mobility with assist (PT) Description: LTG: Patient will perform bed mobility with assistance, with/without cues (PT). Outcome: Completed/Met   Problem: RH Bed to Chair Transfers Goal: LTG Patient will perform bed/chair transfers w/assist (PT) Description: LTG: Patient will perform bed to chair transfers with assistance (PT). Outcome: Completed/Met   Problem: RH Car Transfers Goal: LTG Patient will perform car transfers with assist (PT) Description: LTG: Patient will perform car transfers with assistance (PT). Outcome: Completed/Met   Problem: RH Furniture Transfers Goal: LTG Patient will perform furniture transfers w/assist (OT/PT) Description: LTG: Patient will perform furniture transfers  with assistance (OT/PT). Outcome: Completed/Met   Problem: RH Ambulation Goal: LTG Patient will ambulate in controlled environment (PT) Description: LTG: Patient will ambulate in a controlled environment, # of feet with assistance (PT). Outcome: Completed/Met Goal: LTG Patient will ambulate in home environment (PT) Description: LTG: Patient will ambulate in home environment, # of feet with assistance (PT). Outcome: Completed/Met Goal: LTG Patient will ambulate in community environment (PT) Description: LTG: Patient will  ambulate in community environment, # of feet with assistance (PT). Outcome: Completed/Met   Problem: RH Stairs Goal: LTG Patient will ambulate up and down stairs w/assist (PT) Description: LTG: Patient will ambulate up and down # of stairs with assistance (PT) Outcome: Completed/Met

## 2020-12-10 DIAGNOSIS — E1122 Type 2 diabetes mellitus with diabetic chronic kidney disease: Secondary | ICD-10-CM | POA: Diagnosis not present

## 2020-12-10 DIAGNOSIS — Z48812 Encounter for surgical aftercare following surgery on the circulatory system: Secondary | ICD-10-CM | POA: Diagnosis not present

## 2020-12-10 DIAGNOSIS — M1030 Gout due to renal impairment, unspecified site: Secondary | ICD-10-CM | POA: Diagnosis not present

## 2020-12-10 DIAGNOSIS — H35033 Hypertensive retinopathy, bilateral: Secondary | ICD-10-CM | POA: Diagnosis not present

## 2020-12-10 DIAGNOSIS — D631 Anemia in chronic kidney disease: Secondary | ICD-10-CM | POA: Diagnosis not present

## 2020-12-10 DIAGNOSIS — I132 Hypertensive heart and chronic kidney disease with heart failure and with stage 5 chronic kidney disease, or end stage renal disease: Secondary | ICD-10-CM | POA: Diagnosis not present

## 2020-12-10 DIAGNOSIS — L89323 Pressure ulcer of left buttock, stage 3: Secondary | ICD-10-CM | POA: Diagnosis not present

## 2020-12-10 DIAGNOSIS — N186 End stage renal disease: Secondary | ICD-10-CM | POA: Diagnosis not present

## 2020-12-10 DIAGNOSIS — I5023 Acute on chronic systolic (congestive) heart failure: Secondary | ICD-10-CM | POA: Diagnosis not present

## 2020-12-11 DIAGNOSIS — Z48812 Encounter for surgical aftercare following surgery on the circulatory system: Secondary | ICD-10-CM | POA: Diagnosis not present

## 2020-12-11 DIAGNOSIS — D631 Anemia in chronic kidney disease: Secondary | ICD-10-CM | POA: Diagnosis not present

## 2020-12-11 DIAGNOSIS — H35033 Hypertensive retinopathy, bilateral: Secondary | ICD-10-CM | POA: Diagnosis not present

## 2020-12-11 DIAGNOSIS — I132 Hypertensive heart and chronic kidney disease with heart failure and with stage 5 chronic kidney disease, or end stage renal disease: Secondary | ICD-10-CM | POA: Diagnosis not present

## 2020-12-11 DIAGNOSIS — E1122 Type 2 diabetes mellitus with diabetic chronic kidney disease: Secondary | ICD-10-CM | POA: Diagnosis not present

## 2020-12-11 DIAGNOSIS — I5023 Acute on chronic systolic (congestive) heart failure: Secondary | ICD-10-CM | POA: Diagnosis not present

## 2020-12-11 DIAGNOSIS — L89323 Pressure ulcer of left buttock, stage 3: Secondary | ICD-10-CM | POA: Diagnosis not present

## 2020-12-11 DIAGNOSIS — N186 End stage renal disease: Secondary | ICD-10-CM | POA: Diagnosis not present

## 2020-12-11 DIAGNOSIS — M1030 Gout due to renal impairment, unspecified site: Secondary | ICD-10-CM | POA: Diagnosis not present

## 2020-12-12 ENCOUNTER — Other Ambulatory Visit (HOSPITAL_BASED_OUTPATIENT_CLINIC_OR_DEPARTMENT_OTHER): Payer: Self-pay

## 2020-12-12 DIAGNOSIS — N186 End stage renal disease: Secondary | ICD-10-CM | POA: Diagnosis not present

## 2020-12-12 DIAGNOSIS — N2581 Secondary hyperparathyroidism of renal origin: Secondary | ICD-10-CM | POA: Diagnosis not present

## 2020-12-12 DIAGNOSIS — Z992 Dependence on renal dialysis: Secondary | ICD-10-CM | POA: Diagnosis not present

## 2020-12-13 ENCOUNTER — Telehealth: Payer: Self-pay

## 2020-12-13 ENCOUNTER — Telehealth (HOSPITAL_COMMUNITY): Payer: Self-pay

## 2020-12-13 DIAGNOSIS — H35033 Hypertensive retinopathy, bilateral: Secondary | ICD-10-CM | POA: Diagnosis not present

## 2020-12-13 DIAGNOSIS — L89323 Pressure ulcer of left buttock, stage 3: Secondary | ICD-10-CM | POA: Diagnosis not present

## 2020-12-13 DIAGNOSIS — I132 Hypertensive heart and chronic kidney disease with heart failure and with stage 5 chronic kidney disease, or end stage renal disease: Secondary | ICD-10-CM | POA: Diagnosis not present

## 2020-12-13 DIAGNOSIS — D631 Anemia in chronic kidney disease: Secondary | ICD-10-CM | POA: Diagnosis not present

## 2020-12-13 DIAGNOSIS — M1030 Gout due to renal impairment, unspecified site: Secondary | ICD-10-CM | POA: Diagnosis not present

## 2020-12-13 DIAGNOSIS — N186 End stage renal disease: Secondary | ICD-10-CM | POA: Diagnosis not present

## 2020-12-13 DIAGNOSIS — I5023 Acute on chronic systolic (congestive) heart failure: Secondary | ICD-10-CM | POA: Diagnosis not present

## 2020-12-13 DIAGNOSIS — Z48812 Encounter for surgical aftercare following surgery on the circulatory system: Secondary | ICD-10-CM | POA: Diagnosis not present

## 2020-12-13 DIAGNOSIS — E1122 Type 2 diabetes mellitus with diabetic chronic kidney disease: Secondary | ICD-10-CM | POA: Diagnosis not present

## 2020-12-13 NOTE — Telephone Encounter (Signed)
Transition Care Management Follow-up Telephone Call  Date of discharge and from where: 12/09/2020   [facility]   How have you been since you were released from the hospital? Doing Well  Any questions or concerns? No  Items Reviewed:  Did the pt receive and understand the discharge instructions provided? Yes   Medications obtained and verified? Yes   Other? No   Any new allergies since your discharge? No   Dietary orders reviewed? Yes  Do you have support at home? Yes   Home Care and Equipment/Supplies: Were home health services ordered? yes If so, what is the name of the agency? [facility]  Has the agency set up a time to come to the patient's home? yes Were any new equipment or medical supplies ordered?  Yes: shower chair wheelchair walker  [facility] What is the name of the medical supply agency? [facility]  Were you able to get the supplies/equipment? yes Do you have any questions related to the use of the equipment or supplies? No  Functional Questionnaire: (I = Independent and D = Dependent) ADLs: Dependable   Bathing/Dressing- dependable   Meal Prep- d  Eating- i  Maintaining continence- i  Transferring/Ambulation- d  Managing Meds- d  Follow up appointments reviewed:   PCP Hospital f/u appt confirmed? Yes  Scheduled to see Dr.Fry on 12/27/2020@ [address] f/u appt confirmed? Yes  Scheduled to see 12/17/2020   Are transportation arrangements needed? No   If their condition worsens, is the pt aware to call PCP or go to the Emergency Dept.? Yes  Was the patient provided with contact information for the PCP's office or ED? Yes  Was to pt encouraged to call back with questions or concerns? Yes

## 2020-12-13 NOTE — Telephone Encounter (Signed)
Left VM on 05/10

## 2020-12-14 ENCOUNTER — Telehealth: Payer: Self-pay | Admitting: *Deleted

## 2020-12-14 DIAGNOSIS — N186 End stage renal disease: Secondary | ICD-10-CM | POA: Diagnosis not present

## 2020-12-14 DIAGNOSIS — D688 Other specified coagulation defects: Secondary | ICD-10-CM | POA: Insufficient documentation

## 2020-12-14 DIAGNOSIS — Z992 Dependence on renal dialysis: Secondary | ICD-10-CM | POA: Diagnosis not present

## 2020-12-14 DIAGNOSIS — N2581 Secondary hyperparathyroidism of renal origin: Secondary | ICD-10-CM | POA: Diagnosis not present

## 2020-12-14 NOTE — Telephone Encounter (Signed)
Don [facility] called for  POC 1wk3, 0wk1 1wk1, 0wk1 2wk1. Approval given.

## 2020-12-15 ENCOUNTER — Other Ambulatory Visit (HOSPITAL_COMMUNITY): Payer: Self-pay | Admitting: Unknown Physician Specialty

## 2020-12-15 ENCOUNTER — Telehealth (HOSPITAL_COMMUNITY): Payer: Self-pay

## 2020-12-15 ENCOUNTER — Other Ambulatory Visit (HOSPITAL_COMMUNITY): Payer: Self-pay

## 2020-12-15 ENCOUNTER — Other Ambulatory Visit (HOSPITAL_COMMUNITY): Payer: Self-pay | Admitting: *Deleted

## 2020-12-15 ENCOUNTER — Ambulatory Visit (HOSPITAL_COMMUNITY): Payer: Self-pay

## 2020-12-15 ENCOUNTER — Encounter (HOSPITAL_COMMUNITY): Payer: Self-pay | Admitting: Unknown Physician Specialty

## 2020-12-15 DIAGNOSIS — H35033 Hypertensive retinopathy, bilateral: Secondary | ICD-10-CM | POA: Diagnosis not present

## 2020-12-15 DIAGNOSIS — I132 Hypertensive heart and chronic kidney disease with heart failure and with stage 5 chronic kidney disease, or end stage renal disease: Secondary | ICD-10-CM | POA: Diagnosis not present

## 2020-12-15 DIAGNOSIS — D631 Anemia in chronic kidney disease: Secondary | ICD-10-CM | POA: Diagnosis not present

## 2020-12-15 DIAGNOSIS — E1122 Type 2 diabetes mellitus with diabetic chronic kidney disease: Secondary | ICD-10-CM | POA: Diagnosis not present

## 2020-12-15 DIAGNOSIS — I5023 Acute on chronic systolic (congestive) heart failure: Secondary | ICD-10-CM | POA: Diagnosis not present

## 2020-12-15 DIAGNOSIS — I5042 Chronic combined systolic (congestive) and diastolic (congestive) heart failure: Secondary | ICD-10-CM

## 2020-12-15 DIAGNOSIS — Z48812 Encounter for surgical aftercare following surgery on the circulatory system: Secondary | ICD-10-CM | POA: Diagnosis not present

## 2020-12-15 DIAGNOSIS — M1030 Gout due to renal impairment, unspecified site: Secondary | ICD-10-CM | POA: Diagnosis not present

## 2020-12-15 DIAGNOSIS — Z95811 Presence of heart assist device: Secondary | ICD-10-CM

## 2020-12-15 DIAGNOSIS — L89323 Pressure ulcer of left buttock, stage 3: Secondary | ICD-10-CM | POA: Diagnosis not present

## 2020-12-15 DIAGNOSIS — N186 End stage renal disease: Secondary | ICD-10-CM | POA: Diagnosis not present

## 2020-12-15 DIAGNOSIS — Z7901 Long term (current) use of anticoagulants: Secondary | ICD-10-CM

## 2020-12-15 LAB — POCT INR: INR: 2.1 (ref 2.0–3.0)

## 2020-12-15 NOTE — Telephone Encounter (Signed)
Pharmacy Transitions of Care Follow-up Telephone Call  Date of discharge: 12/09/20 Discharge Diagnosis: debility  How have you been since you were released from the hospital? fine   Medication changes made at discharge:  - START: erythromycin, warfarin  - STOPPED: revatio, amiodorone  - CHANGED: reglan  Medication changes verified by the patient?yes    Medication Accessibility:  Home Pharmacy:   Was the patient provided with refills on discharged medications? no  Have all prescriptions been transferred from [facility] to home pharmacy? no  Is the patient able to afford medications? He is not sure at this time . Notable copays:  . Eligible patient assistance:     Medication Review: WARFARIN - Please verify dosing schedule and ensure they are scheduled to get INR checked at a clinic -reviewed bleeding risk, pt has nosebleed recently but it stopped quickly -pt states he was told to avoid dark green leafy vegetable etc.   Follow-up Appointments:  PCP Hospital f/u appt confirmed? yes  Specialist Hospital f/u appt confirmed? Multiple, pt aware of appts  If their condition worsens, is the pt aware to call PCP or go to the Emergency Dept.? yes  Final Patient Assessment: Pt will get meds refilled at follow up appts as necessary based on physician review.

## 2020-12-15 NOTE — Progress Notes (Signed)
LVAD INR 

## 2020-12-16 ENCOUNTER — Other Ambulatory Visit: Payer: Self-pay

## 2020-12-16 ENCOUNTER — Other Ambulatory Visit (HOSPITAL_COMMUNITY): Payer: Self-pay | Admitting: Cardiology

## 2020-12-16 ENCOUNTER — Encounter (HOSPITAL_COMMUNITY): Payer: Self-pay

## 2020-12-16 ENCOUNTER — Ambulatory Visit (HOSPITAL_COMMUNITY)
Admission: RE | Admit: 2020-12-16 | Discharge: 2020-12-16 | Disposition: A | Discharge status: Home / Self Care | Payer: Medicare HMO | Source: Ambulatory Visit | Attending: Cardiology | Admitting: Cardiology

## 2020-12-16 VITALS — BP 117/61 | HR 92 | Ht 70.0 in | Wt 181.6 lb

## 2020-12-16 DIAGNOSIS — Z95811 Presence of heart assist device: Secondary | ICD-10-CM | POA: Insufficient documentation

## 2020-12-16 DIAGNOSIS — Z4801 Encounter for change or removal of surgical wound dressing: Secondary | ICD-10-CM | POA: Insufficient documentation

## 2020-12-16 DIAGNOSIS — I5042 Chronic combined systolic (congestive) and diastolic (congestive) heart failure: Secondary | ICD-10-CM | POA: Insufficient documentation

## 2020-12-16 DIAGNOSIS — Z951 Presence of aortocoronary bypass graft: Secondary | ICD-10-CM | POA: Insufficient documentation

## 2020-12-16 DIAGNOSIS — N2581 Secondary hyperparathyroidism of renal origin: Secondary | ICD-10-CM | POA: Diagnosis not present

## 2020-12-16 DIAGNOSIS — N186 End stage renal disease: Secondary | ICD-10-CM | POA: Diagnosis not present

## 2020-12-16 DIAGNOSIS — I472 Ventricular tachycardia: Secondary | ICD-10-CM | POA: Insufficient documentation

## 2020-12-16 DIAGNOSIS — Z794 Long term (current) use of insulin: Secondary | ICD-10-CM | POA: Insufficient documentation

## 2020-12-16 DIAGNOSIS — Z23 Encounter for immunization: Secondary | ICD-10-CM | POA: Insufficient documentation

## 2020-12-16 DIAGNOSIS — I4892 Unspecified atrial flutter: Secondary | ICD-10-CM | POA: Insufficient documentation

## 2020-12-16 DIAGNOSIS — I4891 Unspecified atrial fibrillation: Secondary | ICD-10-CM | POA: Diagnosis not present

## 2020-12-16 DIAGNOSIS — Z992 Dependence on renal dialysis: Secondary | ICD-10-CM | POA: Insufficient documentation

## 2020-12-16 DIAGNOSIS — R52 Pain, unspecified: Secondary | ICD-10-CM

## 2020-12-16 DIAGNOSIS — Z8616 Personal history of COVID-19: Secondary | ICD-10-CM | POA: Insufficient documentation

## 2020-12-16 DIAGNOSIS — Z7901 Long term (current) use of anticoagulants: Secondary | ICD-10-CM

## 2020-12-16 DIAGNOSIS — I255 Ischemic cardiomyopathy: Secondary | ICD-10-CM | POA: Diagnosis not present

## 2020-12-16 DIAGNOSIS — Z79899 Other long term (current) drug therapy: Secondary | ICD-10-CM | POA: Diagnosis not present

## 2020-12-16 DIAGNOSIS — E1122 Type 2 diabetes mellitus with diabetic chronic kidney disease: Secondary | ICD-10-CM | POA: Insufficient documentation

## 2020-12-16 DIAGNOSIS — D649 Anemia, unspecified: Secondary | ICD-10-CM | POA: Insufficient documentation

## 2020-12-16 DIAGNOSIS — H8110 Benign paroxysmal vertigo, unspecified ear: Secondary | ICD-10-CM | POA: Insufficient documentation

## 2020-12-16 DIAGNOSIS — I2510 Atherosclerotic heart disease of native coronary artery without angina pectoris: Secondary | ICD-10-CM | POA: Diagnosis not present

## 2020-12-16 DIAGNOSIS — I130 Hypertensive heart and chronic kidney disease with heart failure and stage 1 through stage 4 chronic kidney disease, or unspecified chronic kidney disease: Secondary | ICD-10-CM | POA: Diagnosis not present

## 2020-12-16 MED ORDER — GABAPENTIN 100 MG PO CAPS: 100.0000 mg | ORAL_CAPSULE | Freq: Two times a day (BID) | ORAL | 5 refills | Status: DC

## 2020-12-16 NOTE — Progress Notes (Signed)
Patient presents for hospital discharge follow up in [facility] today with son Marcus Johnson) and brother Marcus Johnson). Reports no problems with VAD equipment or concerns with drive line.  Patient arrived in w/c but walked to scales using rolling walker. He says he has home PT/OT twice weekly on Tues/Thurs.   Patient reports no issues with home equipment. He does report he has had "hard time sleeping" worrying with dropping, pulling, or "messing up" equipment during sleep. Provided and demonstrated use wearing controller around waist on belt and covering with abdominal binder. Pt voiced he liked the added security and will try at home.   Patient reports he is doing dialysis on M/W/F starting at 12:30 every week. Results from labs from [facility] drawn on 12/12/20 reviewed by Dr. Aundra Dubin.   Patient reports he is taking Oxy for hand pain as needed at home. Dr. Aundra Dubin increased gabapentin to 100 mg twice daily for pain.   Son reports patient is not requiring daily Insulin at home with blood sugars running 103 - 120.  Home weights have been between 179 - 177 lbs.    EKG obtained and revealed atrial fib; reviewed by Dr. Aundra Dubin.  Vital Signs:  Doppler Pressure: 86/0 Automatc BP: 117/61 (87) HR:  92 SPO2: 97% on RA   Weight: 181.6 lb w/o eqt Discharge wt: 183.2 lb Home weights:  179 - 177 lbs    VAD Indication: Destination Therapy due to CKD Stage 3b   LVAD assessment:  HM III:: VAD Speed: 5200 rpms Flow:  4.4 Power: 3.8 w    PI:  4.2 Alarms: no clinical alarms  Events: rare  Fixed speed:  5200 Low speed limit:  4900  Primary controller: back up battery due for replacement in 29 months Secondary controller:  back up battery due for replacement in 29 months   I reviewed the LVAD parameters from today and compared the results to the patient's prior recorded data. LVAD interrogation was NEGATIVE for significant power changes, NEGATIVE for clinical alarms and STABLE for PI events/speed  drops. No programming changes were made and pump is functioning within specified parameters. Pt is performing daily controller and system monitor self tests along with completing weekly and monthly maintenance for LVAD equipment.   LVAD equipment check completed and is in good working order. Back-up equipment present.    Annual Equipment Maintenance on UBC/PM was performed 3/22.  Exit Site Care: VAD dressing and anchor removed and site care performed using sterile technique. Drive line exit site cleaned with Chlora prep applicators x 2, allowed to dry before gauze dressing and Aquacel silver strip re-applied. Exit site partially incorporated with small amount light brown drainage with no foul odor. The velour is fully implanted at exit site. No redness or tenderness noted. Drive line anchor re-applied. Pt denies fever or chills. Driveline dressing is being changed twice weekly per sterile technique by son Marcus Johnson) who filmed th dressing change for continued reference. Provided patient with 7 daily dressing kits, 5 anchors, silver strips, and larger sterile gloves for Marcus Johnson to use.    Device: Medtronic single Therapies: on 150 bpm Pacing: VVI 40 Last check:  11/09/20   BP & Labs:  Doppler 86 - Doppler is reflecting MAP   Hgb 10.6 - No S/S of bleeding. Specifically denies melena/BRBPR or nosebleeds.   LDH not done - established baseline of 160 - 180. Denies tea-colored urine. No power elevations noted on interrogation.    Patient Instructions: 1. Stop erythromycin. 2. Stop scopolomine  patch. 3. Increase gabapentin to 100 mg twice daily for pain. 4. Return to [place] [facility] in 2 weeks.    Zada Girt, RN VAD Coordinator    Office: [phone] 24/7 Emergency VAD Pager: [phone]  HF Cardiology: Dr. Aundra Dubin  Follow up for Heart Failure/LVAD:  Marcus Johnson. is a 60 y.o. male who has a h/o DM2, CAD s/p CABG 7616, systolic HF due to ischemic cardiomyopathy with EF 20-25% (echo 12/15),  DM2 and CKD. He is s/p Medtronic single chamber ICD.  Admitted in 12/15 due to ADHF. Required short course milrinone for diuresis. Diuresed 30 pounds.   CPX 2/16 showed severely reduced functional capacity.  There was a significant disconnect between his symptoms and the CPX.  RHC in 6/16 showed fairly normal filling pressures and low but not markedly low cardiac output.   CPX (4/19) was submaximal but suggested severe limitation from HF.   He was admitted in 6/19 with marked volume overload and dyspnea.  Echo in 6/19 showed EF 20% with severe global hypokinesis.  LHC/RHC showed severe native vessel CAD with patent LIMA-D, SVG-OM, and SVG-PLV; cardiac index low.  He was started on milrinone and diuresed.  We discussed LVAD extensively in light of low output, creatinine that is trending up and concern for decompensation of RV over time. He was adamant that he did not want to pursue LVAD workup yet and did not want a referral for transplant evaluation. Milrinone was weaned off and he was discharged home.   Admitted 08/01/18-08/07/18 with volume overload and left foot wound. Diuresed 38 lbs with IV lasix and then transitioned back to torsemide 100 mg BID. ID was consulted with concerns for left foot osteomyelitis noted on CT scan. He got IV ancef q8 hours x 42 days. [facility]HC provided teaching and supplies for home antibiotics.Course complicated by AKI. DC weight 200 lbs.   He went into atrial flutter and had DCCV to NSR in 1/20.   Echo in 4/21 showed EF 25% with apical akinesis and diffuse hypokinesis relatively preserved in lateral wall, mildly decreased RV systolic function, PASP 41 mmHg. CPX in 5/21 showed severe functional limitation suggestive of advanced HF. 5/21 Zio patch showed short atrial flutter runs, 3.8% PVCs.   He developed COVID-19 infection in 1/22.  Symptoms were predominantly GI.    He developed progressive worsening of CHF in early 2022 and was admitted in 3/22 with low output HF.   Impella 5.5 was placed as bridge to Heartmate III LVAD placed in 10/13/20.  He developed post-op renal failure and ended up on dialysis.  He was markedly deconditioned post-op and went to CIR.  Now he is at home.   Denies LVAD alarms.  Denies driveline trauma, erythema or drainage.  Denies ICD shocks.   Reports taking Coumadin as prescribed and adherence to anticoagulation based dietary restrictions.  Denies bright red blood per rectum or melena, no dark urine or hematuria.    He is doing ok at home.  Still feels weak in general and using his walker most of the time.  Getting PT twice a week.  Tolerating HD.  He is able to walk up 3 flights of stairs to his apartment.  Right hand still numb and tingling.  Still gets symptoms consistent with positional vertigo when he lies down.  Minimal nausea now.    ECG (personally reviewed): baseline artifact, difficult to discern rhythm  Labs (5/22): LDH 174, hgb 10.6   Past Medical History:  Diagnosis Date  .  AICD (automatic cardioverter/defibrillator) present    Single-chamber  implantable cardiac defibrillator - Medtronic  . Atrial fibrillation ([place])   . Cataract    Mixed form OU  . CHF (congestive heart failure) ([place])   . Chronic kidney disease   . Chronic kidney disease (CKD), stage III (moderate) (HCC)   . Chronic systolic heart failure ([place])    a. Echo 5/13: Mild LVE, mild LVH, EF 10%, anteroseptal, lateral, apical AK, mild MR, mild LAE, moderately reduced RVSF, mild RAE, PASP 60;  b. 07/2014 Echo: EF 20-2%, diff HK, AKI of antsep/apical/mid-apicalinferior, mod reduced RV.  Marland Kitchen Coronary artery disease    a. s/p CABG 2002 b. LHC 5/13:  dLM 80%, LAD subtotally occluded, pCFX occluded, pRCA 50%, mid? Occlusion with high take off of the PDA with 70% multiple lesions-not bypassed and supplies collaterals to LAD, LIMA-IM/ramus ok, S-OM ok, S-PLA branches ok. Medical therapy was recommended  . COVID   . Diabetic retinopathy (HCC)    NPDR OD, PDR OS  .  Dyspnea   . Gout    "on daily RX" (01/08/2018)  . Hypertension   . Hypertensive retinopathy    OU  . Ischemic cardiomyopathy    a. Echo 5/13: Mild LVE, mild LVH, EF 10%, anteroseptal, lateral, apical AK, mild MR, mild LAE, moderately reduced RVSF, mild RAE, PASP 60;  b. 01/2012 s/p MDT [id number] Protecta XT VR AICD;  c. 07/2014 Echo: EF 20-2%, diff HK, AKI of antsep/apical/mid-apicalinferior, mod reduced RV.  Marland Kitchen MRSA (methicillin resistant Staphylococcus aureus)    Status post right foot plantar deep infection with MRSA status post  I&D 10/2008  . Myocardial infarction [facility])    "was told I'd had several before heart OR in 2002" (01/08/2018)  . Noncompliance   . Peripheral neuropathy   . Retinopathy, diabetic, background ([place])   . Syncope   . Type II diabetes mellitus ([place])    requiring insulin   . Vitreous hemorrhage, left (HCC)    and proliferative diabetic retinopathy    Current Outpatient Medications  Medication Sig Dispense Refill  . acetaminophen (TYLENOL) 325 MG tablet Take 1-2 tablets (325-650 mg total) by mouth every 4 (four) hours as needed for mild pain.    Marland Kitchen ascorbic acid (VITAMIN C) 1000 MG tablet Take 1 tablet (1,000 mg total) by mouth daily. 30 tablet 0  . gabapentin (NEURONTIN) 100 MG capsule Take 1 capsule (100 mg total) by mouth 2 (two) times daily. 60 capsule 5  . insulin glargine (LANTUS) 100 UNIT/ML injection Inject 0.08 mLs (8 Units total) into the skin daily. 10 mL 11  . Menthol-Methyl Salicylate (MUSCLE RUB) 10-15 % CREA Apply 1 application topically 2 (two) times daily. To right shoulder--avoid IV site 85 g 0  . metoCLOPramide (REGLAN) 10 MG tablet Take 1 tablet (10 mg total) by mouth 3 (three) times daily before meals. 90 tablet 0  . midodrine (PROAMATINE) 10 MG tablet Take 1 tablet (10 mg total) by mouth 3 (three) times daily with meals. 90 tablet 0  . multivitamin (RENA-VIT) TABS tablet Take 1 tablet by mouth at bedtime. 30 tablet 0  . oxyCODONE (OXY IR/ROXICODONE) 5  MG immediate release tablet Take 1 tablet (5 mg total) by mouth every 6 (six) hours as needed for severe pain. 24 tablet 0  . pantoprazole (PROTONIX) 40 MG tablet Take 1 tablet (40 mg total) by mouth daily. 30 tablet 0  . polyethylene glycol powder (GLYCOLAX/MIRALAX) 17 GM/SCOOP powder Dissolve 1 capful in water and drink  2 (two) times daily. 510 g 0  . rosuvastatin (CRESTOR) 10 MG tablet Take 1 tablet (10 mg total) by mouth daily. 30 tablet 0  . senna-docusate (SENOKOT-S) 8.6-50 MG tablet Take 2 tablets by mouth 2 (two) times daily. 120 tablet 0  . warfarin (COUMADIN) 3 MG tablet Take 1 tablet (3 mg total) by mouth daily at 4 PM. 30 tablet 0   No current facility-administered medications for this encounter.    Meclizine hcl and Ivabradine  REVIEW OF SYSTEMS: All systems negative except as listed in HPI, PMH and Problem list.   LVAD INTERROGATION:  Please see LVAD nurse's note above.   I reviewed the LVAD parameters from today, and compared the results to the patient's prior recorded data.  No programming changes were made.  The LVAD is functioning within specified parameters.  The patient performs LVAD self-test daily.  LVAD interrogation was negative for any significant power changes, alarms or PI events/speed drops.  LVAD equipment check completed and is in good working order.  Back-up equipment present.   LVAD education done on emergency procedures and precautions and reviewed exit site care.    Vitals:   12/16/20 1215 12/16/20 1216  BP: (!) 86/0 117/61  Pulse:  92  SpO2:  97%  Weight:  82.4 kg (181 lb 9.6 oz)  Height:  5\' 10"  (1.778 m)    Physical Exam: GENERAL: Well appearing, male who presents to clinic today in no acute distress. HEENT: normal  NECK: Supple, JVP 8.  2+ bilaterally, no bruits.  No lymphadenopathy or thyromegaly appreciated.   CARDIAC:  Mechanical heart sounds with LVAD hum present.  LUNGS:  Clear to auscultation bilaterally.  ABDOMEN:  Soft, round,  nontender, positive bowel sounds x4.     LVAD exit site: well-healed and incorporated.  Dressing dry and intact.  No erythema or drainage.  Stabilization device present and accurately applied.  Driveline dressing is being changed daily per sterile technique. EXTREMITIES:  Warm and dry, no cyanosis, clubbing, rash or edema  NEUROLOGIC:  Alert and oriented x 4.  Gait steady.  No aphasia.  No dysarthria.  Affect pleasant.     ASSESSMENT AND PLAN:  1.Chronic systolic VZC:HYIFOYDX cardiomyopathy.HasMedtronic ICD. Low output HF, admitted and Impella 5.5 placed 10/04/20.  HM3 LVAD was placed on 10/13/20. Developed post-op renal failure requiring eventual hemodialysis.   - MAP stable on midodrine, continue for now.     - Continue compression stockings.   - HD for volume management  2. LVAD:  VAD interrogated personally. Parameters stable with rare PI events.  - He is now off ASA.  - Continue warfarin with INR goal 2-2.5.   - DL site ok 3. CAD s/p CABG2002:Last cath in 6/19 with patent grafts. No chest pain.  - Continue Crestor.  4. ESRD: Currently dialyzing via tunneled catheter.  - Continue HD M-W-F  - With continuous flow LVAD, think AV fistula would be less likely to mature.  Will need to place AV graft at some point in the future.  5. Atrial flutter/fibrillation: s/p DC-CV 08/21/18.  ECG today with baseline artifact, unable to tell rhythm.  6. Right hand: Likely neuropathic from stretch of brachial plexus with Impella placement.  - Continue gabapentin, increase to 100 mg bid.  7. DM:Per primary care.  8. Anemia: Hgb including.  9. Deconditioning: Continue PT, gradually improving.  10. Decubitus ulcer: Improving.  11. NSVT: Stopped amiodarone with tremors, resolved off amiodarone.  12. BPPV: Still with occasional symptoms.  13. Suspected Gastroparesis. Nausea improved on erythromycin and Reglan.  - stop erythromycin.  Loralie Champagne 12/18/2020

## 2020-12-16 NOTE — Patient Instructions (Signed)
1. Stop erythromycin. 2. Stop scopolomine patch. 3. Increase gabapentin to 100 mg twice daily for pain. 4. Return to [place] clinic in 2 weeks.

## 2020-12-19 ENCOUNTER — Encounter (HOSPITAL_COMMUNITY): Payer: Medicare HMO

## 2020-12-19 DIAGNOSIS — N186 End stage renal disease: Secondary | ICD-10-CM | POA: Diagnosis not present

## 2020-12-19 DIAGNOSIS — Z992 Dependence on renal dialysis: Secondary | ICD-10-CM | POA: Diagnosis not present

## 2020-12-19 DIAGNOSIS — N2581 Secondary hyperparathyroidism of renal origin: Secondary | ICD-10-CM | POA: Diagnosis not present

## 2020-12-20 ENCOUNTER — Encounter (HOSPITAL_COMMUNITY): Payer: Medicare HMO

## 2020-12-21 DIAGNOSIS — Z992 Dependence on renal dialysis: Secondary | ICD-10-CM | POA: Diagnosis not present

## 2020-12-21 DIAGNOSIS — N2581 Secondary hyperparathyroidism of renal origin: Secondary | ICD-10-CM | POA: Diagnosis not present

## 2020-12-21 DIAGNOSIS — N186 End stage renal disease: Secondary | ICD-10-CM | POA: Diagnosis not present

## 2020-12-22 DIAGNOSIS — Z48812 Encounter for surgical aftercare following surgery on the circulatory system: Secondary | ICD-10-CM | POA: Diagnosis not present

## 2020-12-22 DIAGNOSIS — E1122 Type 2 diabetes mellitus with diabetic chronic kidney disease: Secondary | ICD-10-CM | POA: Diagnosis not present

## 2020-12-22 DIAGNOSIS — H35033 Hypertensive retinopathy, bilateral: Secondary | ICD-10-CM | POA: Diagnosis not present

## 2020-12-22 DIAGNOSIS — N186 End stage renal disease: Secondary | ICD-10-CM | POA: Diagnosis not present

## 2020-12-22 DIAGNOSIS — D631 Anemia in chronic kidney disease: Secondary | ICD-10-CM | POA: Diagnosis not present

## 2020-12-22 DIAGNOSIS — L89323 Pressure ulcer of left buttock, stage 3: Secondary | ICD-10-CM | POA: Diagnosis not present

## 2020-12-22 DIAGNOSIS — M1030 Gout due to renal impairment, unspecified site: Secondary | ICD-10-CM | POA: Diagnosis not present

## 2020-12-22 DIAGNOSIS — I5023 Acute on chronic systolic (congestive) heart failure: Secondary | ICD-10-CM | POA: Diagnosis not present

## 2020-12-22 DIAGNOSIS — I132 Hypertensive heart and chronic kidney disease with heart failure and with stage 5 chronic kidney disease, or end stage renal disease: Secondary | ICD-10-CM | POA: Diagnosis not present

## 2020-12-23 DIAGNOSIS — L89323 Pressure ulcer of left buttock, stage 3: Secondary | ICD-10-CM | POA: Diagnosis not present

## 2020-12-23 DIAGNOSIS — N186 End stage renal disease: Secondary | ICD-10-CM | POA: Diagnosis not present

## 2020-12-23 DIAGNOSIS — Z48812 Encounter for surgical aftercare following surgery on the circulatory system: Secondary | ICD-10-CM | POA: Diagnosis not present

## 2020-12-23 DIAGNOSIS — Z992 Dependence on renal dialysis: Secondary | ICD-10-CM | POA: Diagnosis not present

## 2020-12-23 DIAGNOSIS — N2581 Secondary hyperparathyroidism of renal origin: Secondary | ICD-10-CM | POA: Diagnosis not present

## 2020-12-23 DIAGNOSIS — I132 Hypertensive heart and chronic kidney disease with heart failure and with stage 5 chronic kidney disease, or end stage renal disease: Secondary | ICD-10-CM | POA: Diagnosis not present

## 2020-12-23 DIAGNOSIS — E1122 Type 2 diabetes mellitus with diabetic chronic kidney disease: Secondary | ICD-10-CM | POA: Diagnosis not present

## 2020-12-23 DIAGNOSIS — I5023 Acute on chronic systolic (congestive) heart failure: Secondary | ICD-10-CM | POA: Diagnosis not present

## 2020-12-23 DIAGNOSIS — H35033 Hypertensive retinopathy, bilateral: Secondary | ICD-10-CM | POA: Diagnosis not present

## 2020-12-23 DIAGNOSIS — M1030 Gout due to renal impairment, unspecified site: Secondary | ICD-10-CM | POA: Diagnosis not present

## 2020-12-23 DIAGNOSIS — D631 Anemia in chronic kidney disease: Secondary | ICD-10-CM | POA: Diagnosis not present

## 2020-12-26 ENCOUNTER — Other Ambulatory Visit: Payer: Self-pay

## 2020-12-26 DIAGNOSIS — N2581 Secondary hyperparathyroidism of renal origin: Secondary | ICD-10-CM | POA: Diagnosis not present

## 2020-12-26 DIAGNOSIS — N186 End stage renal disease: Secondary | ICD-10-CM | POA: Diagnosis not present

## 2020-12-26 DIAGNOSIS — Z992 Dependence on renal dialysis: Secondary | ICD-10-CM | POA: Diagnosis not present

## 2020-12-27 ENCOUNTER — Telehealth: Payer: Self-pay | Admitting: Family Medicine

## 2020-12-27 ENCOUNTER — Ambulatory Visit (INDEPENDENT_AMBULATORY_CARE_PROVIDER_SITE_OTHER): Payer: Medicare HMO | Admitting: Family Medicine

## 2020-12-27 ENCOUNTER — Other Ambulatory Visit (HOSPITAL_COMMUNITY): Payer: Medicare HMO

## 2020-12-27 ENCOUNTER — Ambulatory Visit (HOSPITAL_COMMUNITY): Payer: Self-pay

## 2020-12-27 ENCOUNTER — Encounter: Payer: Self-pay | Admitting: Family Medicine

## 2020-12-27 ENCOUNTER — Telehealth: Payer: Self-pay

## 2020-12-27 VITALS — BP 96/58 | HR 84 | Temp 98.1°F | Wt 176.0 lb

## 2020-12-27 DIAGNOSIS — I5023 Acute on chronic systolic (congestive) heart failure: Secondary | ICD-10-CM

## 2020-12-27 DIAGNOSIS — D631 Anemia in chronic kidney disease: Secondary | ICD-10-CM | POA: Diagnosis not present

## 2020-12-27 DIAGNOSIS — E1122 Type 2 diabetes mellitus with diabetic chronic kidney disease: Secondary | ICD-10-CM

## 2020-12-27 DIAGNOSIS — H35033 Hypertensive retinopathy, bilateral: Secondary | ICD-10-CM | POA: Diagnosis not present

## 2020-12-27 DIAGNOSIS — I5042 Chronic combined systolic (congestive) and diastolic (congestive) heart failure: Secondary | ICD-10-CM | POA: Diagnosis not present

## 2020-12-27 DIAGNOSIS — N186 End stage renal disease: Secondary | ICD-10-CM | POA: Diagnosis not present

## 2020-12-27 DIAGNOSIS — N1832 Chronic kidney disease, stage 3b: Secondary | ICD-10-CM | POA: Diagnosis not present

## 2020-12-27 DIAGNOSIS — N183 Chronic kidney disease, stage 3 unspecified: Secondary | ICD-10-CM

## 2020-12-27 DIAGNOSIS — I4819 Other persistent atrial fibrillation: Secondary | ICD-10-CM | POA: Diagnosis not present

## 2020-12-27 DIAGNOSIS — L89323 Pressure ulcer of left buttock, stage 3: Secondary | ICD-10-CM | POA: Diagnosis not present

## 2020-12-27 DIAGNOSIS — Z48812 Encounter for surgical aftercare following surgery on the circulatory system: Secondary | ICD-10-CM | POA: Diagnosis not present

## 2020-12-27 DIAGNOSIS — I132 Hypertensive heart and chronic kidney disease with heart failure and with stage 5 chronic kidney disease, or end stage renal disease: Secondary | ICD-10-CM | POA: Diagnosis not present

## 2020-12-27 DIAGNOSIS — Z794 Long term (current) use of insulin: Secondary | ICD-10-CM

## 2020-12-27 DIAGNOSIS — M1030 Gout due to renal impairment, unspecified site: Secondary | ICD-10-CM | POA: Diagnosis not present

## 2020-12-27 LAB — POCT INR: INR: 1.7 — AB (ref 2.0–3.0)

## 2020-12-27 MED ORDER — METOCLOPRAMIDE HCL 10 MG PO TABS: 10.0000 mg | ORAL_TABLET | Freq: Three times a day (TID) | ORAL | 11 refills | Status: DC

## 2020-12-27 MED ORDER — ROSUVASTATIN CALCIUM 10 MG PO TABS: 10.0000 mg | ORAL_TABLET | Freq: Every day | ORAL | 11 refills | Status: DC

## 2020-12-27 NOTE — Telephone Encounter (Signed)
The patient is receiving a new wirex for his monitor. He has an appointment on May 31st and his ICD can be interrogated at that time.

## 2020-12-27 NOTE — Progress Notes (Signed)
   Subjective:    Patient ID: Marcus Johnson., male    DOB: 1960/10/19, 60 y.o.   MRN: [id number]  HPI Here with his brother to follow up on recent hospital stays. He was in [facility] from 09-28-20 to 11-14-20 for acute on chronic systolic heart failure, and this led to AKI. His LVEF was down to 15% and he did not respond to vasopressors, so an Impella LVAD was implanted. As a result of his creatinine rising to 5.58 he was started on hemodialysis. His A1c on admission was 7.8, and as a result his short acting insulin was stopped and he was converted to a Lantus sliding scale. He also developed a sacral decubitus which was treated with hydrotherapy. He was stabilized and then he was in rehab under Dr. Naaman Plummer from 11-14-20 to 12-09-20. He has been at home since then with his brother as his caretaker. He has [facility] coming out for nursing visits 5 days a week, and he has PT and OT coming 3 days a week. He goes to dialysis 3 days a week. He feels well in general except for chronic fatigue. His BP at home averages 120/70. His fasting glucoses average 90-110. His appetite is good. He asks [place] to check an A1c and an INR today, so he won't have to travel to the Coumadin clinic today.    Review of Systems  Constitutional: Positive for fatigue.  Respiratory: Negative.   Cardiovascular: Negative.   Gastrointestinal: Negative.   Genitourinary: Negative.        Objective:   Physical Exam Constitutional:      Appearance: Normal appearance.     Comments: Walks with assistance   Cardiovascular:     Rate and Rhythm: Normal rate and regular rhythm.     Pulses: Normal pulses.     Heart sounds: Normal heart sounds.  Pulmonary:     Effort: Pulmonary effort is normal.     Breath sounds: Normal breath sounds. No rales.  Musculoskeletal:     Right lower leg: No edema.     Left lower leg: No edema.     Comments: Wearing compression stockings   Neurological:     Mental Status: He is alert.            Assessment & Plan:  He is doing well after a hospital and rehab stay where a LVAD was placed and he was started  On hemodualysis. We will check an INR to day so the Coumadin clinic can monitor his Coumadin dose. We will get an A1c for the diabetes. He is scheduled to see Dr. Marval Regal in Nephrology tomorrow. He will follow up with Cardiology soon. He is getting PT and OT. We spent 60 minutes together today reviewing records and discussing these issues.  Alysia Penna, MD

## 2020-12-27 NOTE — Progress Notes (Signed)
LVAD INR 

## 2020-12-27 NOTE — Telephone Encounter (Signed)
Left secured voicemail on [name], OT for [place].  Clarification for Rosuvastatin that was submitted to pharmacy on 12/27/20

## 2020-12-27 NOTE — Telephone Encounter (Signed)
The patient states he is sending a transmission because he been in the hospital for 2 months. He states his tachy therapies was turned off. He wants to know was it turned back on. He would like for the nurse to call him when she receive the transmission.

## 2020-12-27 NOTE — Telephone Encounter (Signed)
[facility] nurse  [name] needs clarification on a medication change that was done today on one of the patients medication  contact number  [phone]

## 2020-12-28 ENCOUNTER — Telehealth: Payer: Self-pay

## 2020-12-28 DIAGNOSIS — N2581 Secondary hyperparathyroidism of renal origin: Secondary | ICD-10-CM | POA: Diagnosis not present

## 2020-12-28 DIAGNOSIS — Z992 Dependence on renal dialysis: Secondary | ICD-10-CM | POA: Diagnosis not present

## 2020-12-28 DIAGNOSIS — N186 End stage renal disease: Secondary | ICD-10-CM | POA: Diagnosis not present

## 2020-12-28 NOTE — Telephone Encounter (Signed)
[name], OT from [facility] called requesting HHOT to add edema control for right hand swollen. Orders approved and given.

## 2020-12-29 ENCOUNTER — Telehealth (HOSPITAL_COMMUNITY): Payer: Self-pay | Admitting: Licensed Clinical Social Worker

## 2020-12-29 DIAGNOSIS — N186 End stage renal disease: Secondary | ICD-10-CM | POA: Diagnosis not present

## 2020-12-29 DIAGNOSIS — I5023 Acute on chronic systolic (congestive) heart failure: Secondary | ICD-10-CM | POA: Diagnosis not present

## 2020-12-29 DIAGNOSIS — L89323 Pressure ulcer of left buttock, stage 3: Secondary | ICD-10-CM | POA: Diagnosis not present

## 2020-12-29 DIAGNOSIS — H35033 Hypertensive retinopathy, bilateral: Secondary | ICD-10-CM | POA: Diagnosis not present

## 2020-12-29 DIAGNOSIS — E1122 Type 2 diabetes mellitus with diabetic chronic kidney disease: Secondary | ICD-10-CM | POA: Diagnosis not present

## 2020-12-29 DIAGNOSIS — Z48812 Encounter for surgical aftercare following surgery on the circulatory system: Secondary | ICD-10-CM | POA: Diagnosis not present

## 2020-12-29 DIAGNOSIS — M1030 Gout due to renal impairment, unspecified site: Secondary | ICD-10-CM | POA: Diagnosis not present

## 2020-12-29 DIAGNOSIS — I132 Hypertensive heart and chronic kidney disease with heart failure and with stage 5 chronic kidney disease, or end stage renal disease: Secondary | ICD-10-CM | POA: Diagnosis not present

## 2020-12-29 DIAGNOSIS — D631 Anemia in chronic kidney disease: Secondary | ICD-10-CM | POA: Diagnosis not present

## 2020-12-29 NOTE — Telephone Encounter (Signed)
CSW contacted patient to remind of [employer] although patient stated he will not be able to attend due to dialysis. Patient stated he is managing at home but feeling fatigued from dialysis treatments. Patient shared that he is busy with home care visits and therapy. Patient sounded fatigued on the phone but denied any concerns at this time. CSW will continue to follow for supportive needs as identified. Raquel Sarna, [place], [place]

## 2020-12-30 DIAGNOSIS — N186 End stage renal disease: Secondary | ICD-10-CM | POA: Diagnosis not present

## 2020-12-30 DIAGNOSIS — Z992 Dependence on renal dialysis: Secondary | ICD-10-CM | POA: Diagnosis not present

## 2020-12-30 DIAGNOSIS — N2581 Secondary hyperparathyroidism of renal origin: Secondary | ICD-10-CM | POA: Diagnosis not present

## 2021-01-02 DIAGNOSIS — Z992 Dependence on renal dialysis: Secondary | ICD-10-CM | POA: Diagnosis not present

## 2021-01-02 DIAGNOSIS — N2581 Secondary hyperparathyroidism of renal origin: Secondary | ICD-10-CM | POA: Diagnosis not present

## 2021-01-02 DIAGNOSIS — N186 End stage renal disease: Secondary | ICD-10-CM | POA: Diagnosis not present

## 2021-01-03 ENCOUNTER — Ambulatory Visit (HOSPITAL_COMMUNITY)
Admission: RE | Admit: 2021-01-03 | Discharge: 2021-01-03 | Disposition: A | Discharge status: Home / Self Care | Payer: Medicare HMO | Source: Ambulatory Visit | Attending: Cardiology | Admitting: Cardiology

## 2021-01-03 ENCOUNTER — Other Ambulatory Visit: Payer: Self-pay

## 2021-01-03 ENCOUNTER — Encounter (HOSPITAL_COMMUNITY): Payer: Self-pay

## 2021-01-03 ENCOUNTER — Telehealth: Payer: Self-pay | Admitting: Family Medicine

## 2021-01-03 ENCOUNTER — Ambulatory Visit (HOSPITAL_COMMUNITY): Payer: Self-pay

## 2021-01-03 VITALS — BP 115/76 | HR 102 | Temp 98.0°F | Ht 70.0 in | Wt 182.0 lb

## 2021-01-03 DIAGNOSIS — I5023 Acute on chronic systolic (congestive) heart failure: Secondary | ICD-10-CM | POA: Diagnosis not present

## 2021-01-03 DIAGNOSIS — Z452 Encounter for adjustment and management of vascular access device: Secondary | ICD-10-CM | POA: Insufficient documentation

## 2021-01-03 DIAGNOSIS — R531 Weakness: Secondary | ICD-10-CM

## 2021-01-03 DIAGNOSIS — H8110 Benign paroxysmal vertigo, unspecified ear: Secondary | ICD-10-CM | POA: Diagnosis not present

## 2021-01-03 DIAGNOSIS — N186 End stage renal disease: Secondary | ICD-10-CM | POA: Diagnosis not present

## 2021-01-03 DIAGNOSIS — I472 Ventricular tachycardia: Secondary | ICD-10-CM | POA: Insufficient documentation

## 2021-01-03 DIAGNOSIS — Z7901 Long term (current) use of anticoagulants: Secondary | ICD-10-CM | POA: Diagnosis not present

## 2021-01-03 DIAGNOSIS — Z95810 Presence of automatic (implantable) cardiac defibrillator: Secondary | ICD-10-CM | POA: Diagnosis not present

## 2021-01-03 DIAGNOSIS — D631 Anemia in chronic kidney disease: Secondary | ICD-10-CM | POA: Diagnosis not present

## 2021-01-03 DIAGNOSIS — Z951 Presence of aortocoronary bypass graft: Secondary | ICD-10-CM | POA: Insufficient documentation

## 2021-01-03 DIAGNOSIS — I5022 Chronic systolic (congestive) heart failure: Secondary | ICD-10-CM | POA: Diagnosis not present

## 2021-01-03 DIAGNOSIS — L8990 Pressure ulcer of unspecified site, unspecified stage: Secondary | ICD-10-CM | POA: Insufficient documentation

## 2021-01-03 DIAGNOSIS — Z79899 Other long term (current) drug therapy: Secondary | ICD-10-CM | POA: Diagnosis not present

## 2021-01-03 DIAGNOSIS — Z794 Long term (current) use of insulin: Secondary | ICD-10-CM | POA: Diagnosis not present

## 2021-01-03 DIAGNOSIS — Z95811 Presence of heart assist device: Secondary | ICD-10-CM | POA: Diagnosis not present

## 2021-01-03 DIAGNOSIS — I2510 Atherosclerotic heart disease of native coronary artery without angina pectoris: Secondary | ICD-10-CM | POA: Diagnosis not present

## 2021-01-03 DIAGNOSIS — I4891 Unspecified atrial fibrillation: Secondary | ICD-10-CM | POA: Diagnosis not present

## 2021-01-03 DIAGNOSIS — Z992 Dependence on renal dialysis: Secondary | ICD-10-CM | POA: Insufficient documentation

## 2021-01-03 DIAGNOSIS — I255 Ischemic cardiomyopathy: Secondary | ICD-10-CM | POA: Insufficient documentation

## 2021-01-03 DIAGNOSIS — I132 Hypertensive heart and chronic kidney disease with heart failure and with stage 5 chronic kidney disease, or end stage renal disease: Secondary | ICD-10-CM | POA: Insufficient documentation

## 2021-01-03 DIAGNOSIS — E1122 Type 2 diabetes mellitus with diabetic chronic kidney disease: Secondary | ICD-10-CM | POA: Insufficient documentation

## 2021-01-03 DIAGNOSIS — G629 Polyneuropathy, unspecified: Secondary | ICD-10-CM | POA: Diagnosis not present

## 2021-01-03 DIAGNOSIS — K3184 Gastroparesis: Secondary | ICD-10-CM

## 2021-01-03 DIAGNOSIS — I951 Orthostatic hypotension: Secondary | ICD-10-CM | POA: Diagnosis not present

## 2021-01-03 DIAGNOSIS — I509 Heart failure, unspecified: Secondary | ICD-10-CM | POA: Diagnosis not present

## 2021-01-03 LAB — PROTIME-INR
INR: 1.9 — ABNORMAL HIGH (ref 0.8–1.2)
Prothrombin Time: 21.5 seconds — ABNORMAL HIGH (ref 11.4–15.2)

## 2021-01-03 MED ORDER — WARFARIN SODIUM 3 MG PO TABS: ORAL_TABLET | ORAL | 3 refills | Status: DC

## 2021-01-03 MED ORDER — MIDODRINE HCL 10 MG PO TABS: 10.0000 mg | ORAL_TABLET | Freq: Three times a day (TID) | ORAL | 3 refills | Status: DC

## 2021-01-03 MED ORDER — ASCORBIC ACID 1000 MG PO TABS: 1000.0000 mg | ORAL_TABLET | Freq: Every day | ORAL | 3 refills | Status: DC

## 2021-01-03 MED ORDER — GABAPENTIN 100 MG PO CAPS: ORAL_CAPSULE | ORAL | 11 refills | Status: DC

## 2021-01-03 MED ORDER — PANTOPRAZOLE SODIUM 40 MG PO TBEC: 40.0000 mg | DELAYED_RELEASE_TABLET | Freq: Every day | ORAL | 3 refills | Status: DC

## 2021-01-03 MED ORDER — RENA-VITE PO TABS: 1.0000 | ORAL_TABLET | Freq: Every day | ORAL | 3 refills | Status: DC

## 2021-01-03 NOTE — Patient Instructions (Addendum)
1. Increase Gabapentin to 100 mg in am and 200 mg in evening.  2. PharmD Jinny Blossom) will call with your INR and warfarin dosing. 3. Return to [facility] in 1 month.  4. Advance to weekly dressings. If drainage occurs, go back twice week dressing changes with gauze dressing.  5. Will send referral for [facility] 6. Will send requested refills to local pharmacy 7. Will send referral for home INR machine

## 2021-01-03 NOTE — Progress Notes (Addendum)
Patient presents for two week follow up in [facility] today with brother [name]). Reports no problems with VAD equipment or concerns with drive line.  Patient arrived in w/c but walked to scales and room without aid. He says he has home PT/OT twice weekly on Tues/Thurs, but no one has been out for two weeks. PT is supposed to call him and arrange next visit. Will refer patient to Cardiac Rehab per Dr. Haroldine Laws.   Patient reports no issues with home equipment. VAD interrogation revealed several "No External Power" alarms on 12/25/20. Patient explained loss of power during this time. His brother helped him switch to battery power until electrical power was back on.   Patient reports he continues M/W/F dialysis with established dry weight of 180 lbs. Patient reports his wt does not vary "much" from his goal weight.   Patient reports he is still taking Oxy occasionally at night for right hand pain. He does report improvement in pain since increasing gabapentin dose to 100 mg twice daily as instructed last clinic visit, but still has pain at night. Per Dr. Aundra Dubin, dose will be increased to 100 mg am and 200 mg pm. Asked patient to call us if this does not relieve pain. New Rx sent.    Son reports patient is not requiring daily Insulin at home with blood sugars running 100 - 120.   Vital Signs:  Temp: 98.0 Doppler Pressure: 100/0 Automatc BP: 115/76 (97)  HR:  102 SPO2: UTO   Weight: 182 lb w/o eqt Last wt: 181.6 lb    VAD Indication: Destination Therapy due to CKD Stage 3b   LVAD assessment:  HM III:: VAD Speed: 5200 rpms Flow:  4.3 Power: 3.9 w    PI:  5.1 Alarms: several No External Power (power outage) Events: rare  Fixed speed:  5200 Low speed limit:  4900  Primary controller: back up battery due for replacement in 29 months Secondary controller:  back up battery due for replacement in 29 months   I reviewed the LVAD parameters from today and compared the results to the  patient's prior recorded data. LVAD interrogation was NEGATIVE for significant power changes, NEGATIVE for clinical alarms and STABLE for PI events/speed drops. No programming changes were made and pump is functioning within specified parameters. Pt is performing daily controller and system monitor self tests along with completing weekly and monthly maintenance for LVAD equipment.   LVAD equipment check completed and is in good working order. Back-up equipment present.    Annual Equipment Maintenance on UBC/PM was performed 3/22.  Exit Site Care: VAD dressing and anchor removed and site care performed using sterile technique. Drive line exit site cleaned with Chlora prep applicators x 2, allowed to dry before Sorbaview dressing with bio patch applied. Exit site near total incorporation with small amount light brown drainage with no foul odor. The velour is fully implanted at exit site. No redness or tenderness noted. Drive line anchor re-applied. Pt denies fever or chills. Driveline dressing is being changed twice weekly per sterile technique by son [name]). Will advance to weekly dressing changes, instructed patient and brother to go back to gauze dressings twice weekly if drainage occurs under sorbaview window; also asked them to call us if this occurs. Provided with 8 weekly dressing kits; patient has daily kits available at home. Provided patient with large sterile gloves for [place] and extra anchors for home use.     Device: Medtronic single Therapies: on 150 bpm Pacing: VVI  40 Last check:  11/09/20   BP & Labs:  Doppler 100 - Doppler is reflecting MAP   Hgb not done today  - No S/S of bleeding. Specifically denies melena/BRBPR or nosebleeds.   LDH  Pending - established baseline of 160 - 180. Denies tea-colored urine. No power elevations noted on interrogation.   3 month Intermacs follow up completed including:  Quality of Life and KCCQ-12. Patient unable to do Neurocognitive Trailmaking, he  is right handed and unable to write at this time. Patient unable to perform 6 minute walk, he is still using w/c and walker.  Patient Instructions: 1. Increase Gabapentin to 100 mg in am and 200 mg in evening.  2. PharmD Jinny Blossom) will call with your INR and warfarin dosing. 3. Return to [facility] in 1 month.  4. Advance to weekly dressings. If drainage occurs, go back twice week dressing changes with gauze dressing.  5. Will send referral for [facility] Cardiac Rehab 6. Will send requested refills to local pharmacy 7. Will send referral for home INR machine    Zada Girt, RN [place] Coordinator  Office: [phone] 24/7 Emergency VAD Pager: [phone]

## 2021-01-03 NOTE — Progress Notes (Signed)
Signed            Show:Clear all [x] Manual[] Template[x] Copied  Added by: [x] Lezlie Octave, RN   [] Hover for details  Patient presents for two week follow up in [facility] today with brother [name]). Reports no problems with VAD equipment or concerns with drive line.  Patient arrived in w/c but walked to scales and room without aid. He says he has home PT/OT twice weekly on Tues/Thurs, but no one has been out for two weeks. PT is supposed to call him and arrange next visit. Will refer patient to Cardiac Rehab per Dr. Haroldine Laws.   Patient reports no issues with home equipment. VAD interrogation revealed several "No External Power" alarms on 12/25/20. Patient explained loss of power during this time. His brother helped him switch to battery power until electrical power was back on.   Patient reports he continues M/W/F dialysis with established dry weight of 180 lbs. Patient reports his wt does not vary "much" from his goal weight.   Patient reports he is still taking Oxy occasionally at night for right hand pain. He does report improvement in pain since increasing gabapentin dose to 100 mg twice daily as instructed last clinic visit, but still has pain at night. Per Dr. Aundra Dubin, dose will be increased to 100 mg am and 200 mg pm. Asked patient to call us if this does not relieve pain. New Rx sent.    Son reports patient is not requiring daily Insulin at home with blood sugars running 100 - 120.   Vital Signs:  Temp: 98.0 Doppler Pressure:100/0 Automatc BP: 115/76 (97)  HR: 102 SPO2: UTO  Weight: 182 lb w/o eqt Last wt: 181.6 lb   VAD Indication: Destination Therapy due to CKD Stage 3b  LVAD assessment:  HM III:: VAD Speed: 5200 rpms Flow:  4.3 Power: 3.9 w PI:  5.1 Alarms: several No External Power (power outage) Events: rare  Fixed speed:  5200 Low speed limit:  4900  Primary controller: back up battery due for replacement in 29  months Secondary controller: back up battery due for replacement in 29 months   I reviewed the LVAD parameters from todayand compared the results to the patient's prior recorded data.LVAD interrogation was NEGATIVEfor significant power changes, NEGATIVEfor clinicalalarms and STABLEfor PI events/speed drops. No programming changes were madeand pump is functioning within specified parameters. Pt is performing daily controller and system monitor self tests along with completing weekly and monthly maintenance for LVAD equipment.  LVAD equipment check completed and is in good working order. Back-up equipment present.   Annual Equipment Maintenance on UBC/PM was performed 3/22.  Exit Site Care: VAD dressing and anchor removed and site care performed using sterile technique. Drive line exit site cleaned with Chlora prep applicators x 2, allowed to dry before Sorbaview dressing with bio patch applied. Exit site near total incorporation with small amount light brown drainage with no foul odor. The velour is fully implanted at exit site. No redness or tenderness noted. Drive line anchor re-applied. Pt denies fever or chills. Driveline dressing is being changed twice weekly per sterile technique by son [name]). Will advance to weekly dressing changes, instructed patient and brother to go back to gauze dressings twice weekly if drainage occurs under sorbaview window; also asked them to call us if this occurs. Provided with 8 weekly dressing kits; patient has daily kits available at home. Provided patient with large sterile gloves for [place] and extra anchors for home use.  Device:Medtronic single Therapies: on 150 bpm Pacing: VVI 40 Last check:  11/09/20  BP &Labs:  Doppler 100- Doppler is reflecting MAP  Hgb not done today  - No S/S of bleeding. Specifically denies melena/BRBPR or nosebleeds.  LDH  Pending - established baseline of 160 - 180. Denies tea-colored urine. No power elevations  noted on interrogation.    Patient Instructions: 1. Increase Gabapentin to 100 mg in am and 200 mg in evening.  2. PharmD Jinny Blossom) will call with your INR and warfarin dosing. 3. Return to [facility] in 1 month.  4. Advance to weekly dressings. If drainage occurs, go back twice week dressing changes with gauze dressing.    Zada Girt, RN VAD Coordinator  Office: [phone] 24/7 Emergency VAD Pager: [phone]       Follow up for Heart Failure/LVAD:  Jakarie Pember. is a 60 y.o. male who has a h/o DM2, CAD s/p CABG 6578, systolic HF due to ischemic cardiomyopathy with EF 20-25% (echo 12/15), DM2 and CKD. He is s/p Medtronic single chamber ICD.  Admitted in 12/15 due to ADHF. Required short course milrinone for diuresis. Diuresed 30 pounds.   CPX 2/16 showed severely reduced functional capacity.  There was a significant disconnect between his symptoms and the CPX.  RHC in 6/16 showed fairly normal filling pressures and low but not markedly low cardiac output.   CPX (4/19) was submaximal but suggested severe limitation from HF.   He was admitted in 6/19 with marked volume overload and dyspnea.  Echo in 6/19 showed EF 20% with severe global hypokinesis.  LHC/RHC showed severe native vessel CAD with patent LIMA-D, SVG-OM, and SVG-PLV; cardiac index low.  He was started on milrinone and diuresed.  We discussed LVAD extensively in light of low output, creatinine that is trending up and concern for decompensation of RV over time. He was adamant that he did not want to pursue LVAD workup yet and did not want a referral for transplant evaluation. Milrinone was weaned off and he was discharged home.   Admitted 08/01/18-08/07/18 with volume overload and left foot wound. Diuresed 38 lbs with IV lasix and then transitioned back to torsemide 100 mg BID. ID was consulted with concerns for left foot osteomyelitis noted on CT scan. He got IV ancef q8 hours x 42 days. [facility] provided teaching and  supplies for home antibiotics.Course complicated by AKI. DC weight 200 lbs.   He went into atrial flutter and had DCCV to NSR in 1/20.   Echo in 4/21 showed EF 25% with apical akinesis and diffuse hypokinesis relatively preserved in lateral wall, mildly decreased RV systolic function, PASP 41 mmHg. CPX in 5/21 showed severe functional limitation suggestive of advanced HF. 5/21 Zio patch showed short atrial flutter runs, 3.8% PVCs.   He developed COVID-19 infection in 1/22.  Symptoms were predominantly GI.    He developed progressive worsening of CHF in early 2022 and was admitted in 3/22 with low output HF.  Impella 5.5 was placed as bridge to Heartmate III LVAD placed in 10/13/20.  He developed post-op renal failure and ended up on dialysis.  He was markedly deconditioned post-op and went to CIR.  Now he is at home.   Today he returns for HF follow up.Overall feeling fine. Continues on HD M-W-F Denies N/V. Denies SOB/PND/Orthopnea. Having ongoing right hand pain. Appetite ok. No fever or chills. Weight at home 182 pounds. No bleeding issues. Taking all medications.  I reviewed the LVAD parameters from  today, and compared the results to the patient's prior recorded data.  No programming changes were made.  The LVAD is functioning within specified parameters.  The patient performs LVAD self-test daily.  LVAD interrogation was negative for any significant power changes, alarms or PI events/speed drops.  LVAD equipment check completed and is in good working order.  Back-up equipment present.   LVAD education done on emergency procedures and precautions and reviewed exit site care.  Labs (5/22): LDH 174, hgb 10.6  LVAD Interrogation HM III: Speed: 5200 Flow: 4.3 PI: 5.1 Power: 3.9  No PI events Had once no external power with power outage.   events   Past Medical History:  Diagnosis Date  . AICD (automatic cardioverter/defibrillator) present    Single-chamber  implantable cardiac  defibrillator - Medtronic  . Atrial fibrillation ([place])   . Cataract    Mixed form OU  . CHF (congestive heart failure) ([place])   . Chronic kidney disease   . Chronic kidney disease (CKD), stage III (moderate) (HCC)   . Chronic systolic heart failure ([place])    a. Echo 5/13: Mild LVE, mild LVH, EF 10%, anteroseptal, lateral, apical AK, mild MR, mild LAE, moderately reduced RVSF, mild RAE, PASP 60;  b. 07/2014 Echo: EF 20-2%, diff HK, AKI of antsep/apical/mid-apicalinferior, mod reduced RV.  Marland Kitchen Coronary artery disease    a. s/p CABG 2002 b. LHC 5/13:  dLM 80%, LAD subtotally occluded, pCFX occluded, pRCA 50%, mid? Occlusion with high take off of the PDA with 70% multiple lesions-not bypassed and supplies collaterals to LAD, LIMA-IM/ramus ok, S-OM ok, S-PLA branches ok. Medical therapy was recommended  . COVID   . Diabetic retinopathy (HCC)    NPDR OD, PDR OS  . Dyspnea   . Gout    "on daily RX" (01/08/2018)  . Hypertension   . Hypertensive retinopathy    OU  . Ischemic cardiomyopathy    a. Echo 5/13: Mild LVE, mild LVH, EF 10%, anteroseptal, lateral, apical AK, mild MR, mild LAE, moderately reduced RVSF, mild RAE, PASP 60;  b. 01/2012 s/p MDT [id number] Protecta XT VR AICD;  c. 07/2014 Echo: EF 20-2%, diff HK, AKI of antsep/apical/mid-apicalinferior, mod reduced RV.  Marland Kitchen MRSA (methicillin resistant Staphylococcus aureus)    Status post right foot plantar deep infection with MRSA status post  I&D 10/2008  . Myocardial infarction [facility])    "was told I'd had several before heart OR in 2002" (01/08/2018)  . Noncompliance   . Peripheral neuropathy   . Retinopathy, diabetic, background ([place])   . Syncope   . Type II diabetes mellitus ([place])    requiring insulin   . Vitreous hemorrhage, left (HCC)    and proliferative diabetic retinopathy    Current Outpatient Medications  Medication Sig Dispense Refill  . acetaminophen (TYLENOL) 325 MG tablet Take 1-2 tablets (325-650 mg total) by mouth every 4 (four)  hours as needed for mild pain.    Marland Kitchen ascorbic acid (VITAMIN C) 1000 MG tablet Take 1 tablet (1,000 mg total) by mouth daily. 30 tablet 0  . gabapentin (NEURONTIN) 100 MG capsule Take 1 capsule (100 mg total) by mouth 2 (two) times daily. 60 capsule 5  . insulin glargine (LANTUS) 100 UNIT/ML injection Inject 0.08 mLs (8 Units total) into the skin daily. 10 mL 11  . Menthol-Methyl Salicylate (MUSCLE RUB) 10-15 % CREA Apply 1 application topically 2 (two) times daily. To right shoulder--avoid IV site 85 g 0  . metoCLOPramide (REGLAN) 10 MG tablet  Take 1 tablet (10 mg total) by mouth 3 (three) times daily before meals. 90 tablet 11  . midodrine (PROAMATINE) 10 MG tablet Take 1 tablet (10 mg total) by mouth 3 (three) times daily with meals. 90 tablet 0  . multivitamin (RENA-VIT) TABS tablet Take 1 tablet by mouth at bedtime. 30 tablet 0  . oxyCODONE (OXY IR/ROXICODONE) 5 MG immediate release tablet Take 1 tablet (5 mg total) by mouth every 6 (six) hours as needed for severe pain. 24 tablet 0  . pantoprazole (PROTONIX) 40 MG tablet Take 1 tablet (40 mg total) by mouth daily. 30 tablet 0  . polyethylene glycol powder (GLYCOLAX/MIRALAX) 17 GM/SCOOP powder Dissolve 1 capful in water and drink 2 (two) times daily. 510 g 0  . rosuvastatin (CRESTOR) 10 MG tablet Take 1 tablet (10 mg total) by mouth daily. 30 tablet 11  . senna-docusate (SENOKOT-S) 8.6-50 MG tablet Take 2 tablets by mouth 2 (two) times daily. 120 tablet 0  . warfarin (COUMADIN) 3 MG tablet Take 1 tablet (3 mg total) by mouth daily at 4 PM. 30 tablet 0   No current facility-administered medications for this encounter.    Meclizine hcl and Ivabradine  REVIEW OF SYSTEMS: All systems negative except as listed in HPI, PMH and Problem list.   LVAD INTERROGATION:  Please see LVAD nurse's note above.   I reviewed the LVAD parameters from today, and compared the results to the patient's prior recorded data.  No programming changes were made.  The  LVAD is functioning within specified parameters.  The patient performs LVAD self-test daily.  LVAD interrogation was negative for any significant power changes, alarms or PI events/speed drops.  LVAD equipment check completed and is in good working order.  Back-up equipment present.   LVAD education done on emergency procedures and precautions and reviewed exit site care.    Vitals:   01/03/21 1133 01/03/21 1134  BP: (!) 100/0 115/76  Pulse:  (!) 102  Temp:  98 F (36.7 C)  Weight:  82.6 kg  Height:  5\' 10"  (1.778 m)     Physical Exam: GENERAL: Walked some in the clinic.  no acute distress. HEENT: normal  NECK: Supple, JVP flat  2+ bilaterally, no bruits.  No lymphadenopathy or thyromegaly appreciated.   CARDIAC:  Mechanical heart sounds with LVAD hum present.  LUNGS:  Clear to auscultation bilaterally.  ABDOMEN:  Soft, round, nontender, positive bowel sounds x4.     LVAD exit site: well-healed and incorporated.  Dressing dry and intact.  No erythema or drainage.  Stabilization device present and accurately applied.  Driveline dressing is being changed daily per sterile technique. EXTREMITIES:  Warm and dry, no cyanosis, clubbing, rash or edema  NEUROLOGIC:  Alert and oriented x 4.  Gait steady.  No aphasia.  No dysarthria.  Affect pleasant.      ASSESSMENT AND PLAN:  1.Chronic systolic ULA:GTXMIWOE cardiomyopathy.HasMedtronic ICD. Low output HF, admitted and Impella 5.5 placed 10/04/20.  HM3 LVAD was placed on 10/13/20. Developed post-op renal failure requiring eventual hemodialysis.   - MAP stable. Continue midodrine - Continue compression stockings.   - HD for volume management  2. LVAD:  VAD interrogated personally. Parameters stable with rare PI events.  - He is now off ASA.  - Continue warfarin with INR goal 2-2.5.   - DL stable. Changing to weekly dressings/  3. CAD s/p CABG2002:Last cath in 6/19 with patent grafts. No chest pain.  - Continue Crestor.  4. ESRD:  Currently dialyzing via tunneled catheter.  - Continue HD M-W-F  - No low flows.  - With continuous flow LVAD, think AV fistula would be less likely to mature.  Will need to place AV graft at some point in the future.  5. Atrial flutter/fibrillation: s/p DC-CV 08/21/18.  ECG today with baseline artifact, unable to tell rhythm.  6. Right hand: Likely neuropathic from stretch of brachial plexus with Impella placement.  - Continue gabapentin, continue 100 mg in am and increase bed time dose to 200 mg  7. DM:Per primary care.  8. Anemia: Hgb including.  9. Deconditioning:Walking up 3 flights of stairs! 10. Decubitus ulcer: Improving.  11. NSVT: Stopped amiodarone with tremors, resolved off amiodarone.  12. BPPV: Still with occasional symptoms.   13. Suspected Gastroparesis. Nausea improved on erythromycin and Reglan.  - stop erythromycin, continue Reglan.  Follow up in 3-4 weeks.  Amy Clegg 01/03/2021   Patient seen with NP, agree with the above note.   He is doing well today, working with PT and gradually increasing activity.  Able to walk up the 3 flights to his apartment.  MAP is stable with midodrine, no lightheadedness.  Tolerating HD well so far.   General: NAD Neck: No JVD, no thyromegaly or thyroid nodule.  Lungs: Clear to auscultation bilaterally with normal respiratory effort. CV: Nondisplaced PMI.  Heart regular S1/S2, no S3/S4, no murmur.  No peripheral edema.  No carotid bruit.  Normal pedal pulses.  Abdomen: Soft, nontender, no hepatosplenomegaly, no distention.  Skin: Intact without lesions or rashes.  Neurologic: Alert and oriented x 3.  Psych: Normal affect. Extremities: No clubbing or cyanosis.  HEENT: Normal.   Continue midodrine.    Continue warfarin with INR goal 2-2.5, now off ASA 81.   Increase gabapentin for neuropathic right hand pain.   Short vertigo episodes still when he lies down, these are tolerable.   Nausea much better, can stop erythromycin but  continue Reglan.    Loralie Champagne 01/03/2021

## 2021-01-03 NOTE — Progress Notes (Signed)
Heart and Vascular Care Navigation  01/03/2021  Marcus Johnson. [birth year] [id number]  Reason for Referral: Patient seen in VAD clinic for follow up appointment post VAD.   Engaged with patient face to face for follow up visit for Heart and Vascular Care Coordination.                                                                                                   Assessment: Patient is a 60yo male who resides in an apartment with his brother and adult son. Patient lives in a 3 bedroom apartment on the 3rd floor of a walk up building. Patient states he is managing the stairs but would like to find an apartment on the first floor. Patient reports he and his son have explored options with no success at this point. Patient receives disability and has managed medicare. Patient denies any SDoH needs at this time and states he is doing well at home.                                    HRT/VAS Care Coordination     Patients Home Cardiology Office Heart Failure Clinic  LVAD Patient   Outpatient Care Team Social Worker   Community Paramedic Name: Not needed at this time   Social Worker Name: Raquel Sarna, Marlinda Mike [phone]   Living arrangements for the past 2 months Apartment   Lives with: Adult Children; Siblings  Son and Brother   Patient Current Insurance Coverage Managed Medicare   Patient Has Concern With Paying Medical Bills No   Does Patient Have Prescription Coverage? Yes   Home Assistive Devices/Equipment Other (Comment); Scales; Walker (specify type); Shower chair without back; Wheelchair; Blood pressure cuff  LVAD Patient   DME [place]   Current home services Home RN; Home PT       Social History:                                                                             SDOH Screenings   Alcohol Screen: Not on file  Depression [facility]): Low Risk   . PHQ-2 Score: 0  Financial Resource Strain: Low Risk   .  Difficulty of Paying Living Expenses: Not very hard  Food Insecurity: No Food Insecurity  . Worried About Charity fundraiser in the Last Year: Never true  . Ran Out of Food in the Last Year: Never true  Housing: Low Risk   . Last Housing Risk Score: 0  Physical Activity: Not on file  Social Connections: Not on file  Stress: Not on file  Tobacco Use: Low Risk   . Smoking Tobacco Use:  Never Smoker  . Smokeless Tobacco Use: Never Used  Transportation Needs: No Transportation Needs  . Lack of Transportation (Medical): No  . Lack of Transportation (Non-Medical): No    SDOH Interventions: Financial Resources:  Sales promotion account executive Interventions: Intervention Not Indicated n/a  Food Insecurity:  Food Insecurity Interventions: Intervention Not Indicated  Housing Insecurity:  Housing Interventions: Intervention Not Indicated  Transportation:   Transportation Interventions: Intervention Not Indicated    Follow-up plan: Patient appears to be in good spirits and adjusting well to life with an LVAD. CSW provided support and will continue to follow as needed. Raquel Sarna, [place], [place]

## 2021-01-03 NOTE — Progress Notes (Signed)
LVAD INR 

## 2021-01-03 NOTE — Telephone Encounter (Signed)
Patient is calling and stated that the pharmacy hasn't received refill for metoCLOPramide (REGLAN) 10 MG tablet to be sent to  [facility] [place] [facility] [place]  [address] [address]  Phone:  [phone] Fax:  [phone]   Also patient is requesting a call back regarding lab results, please advise. CB is [phone]

## 2021-01-04 DIAGNOSIS — Z992 Dependence on renal dialysis: Secondary | ICD-10-CM | POA: Diagnosis not present

## 2021-01-04 DIAGNOSIS — N186 End stage renal disease: Secondary | ICD-10-CM | POA: Diagnosis not present

## 2021-01-04 DIAGNOSIS — N2581 Secondary hyperparathyroidism of renal origin: Secondary | ICD-10-CM | POA: Diagnosis not present

## 2021-01-05 DIAGNOSIS — M1030 Gout due to renal impairment, unspecified site: Secondary | ICD-10-CM | POA: Diagnosis not present

## 2021-01-05 DIAGNOSIS — D631 Anemia in chronic kidney disease: Secondary | ICD-10-CM | POA: Diagnosis not present

## 2021-01-05 DIAGNOSIS — Z48812 Encounter for surgical aftercare following surgery on the circulatory system: Secondary | ICD-10-CM | POA: Diagnosis not present

## 2021-01-05 DIAGNOSIS — H35033 Hypertensive retinopathy, bilateral: Secondary | ICD-10-CM | POA: Diagnosis not present

## 2021-01-05 DIAGNOSIS — E1122 Type 2 diabetes mellitus with diabetic chronic kidney disease: Secondary | ICD-10-CM | POA: Diagnosis not present

## 2021-01-05 DIAGNOSIS — L89323 Pressure ulcer of left buttock, stage 3: Secondary | ICD-10-CM | POA: Diagnosis not present

## 2021-01-05 DIAGNOSIS — I132 Hypertensive heart and chronic kidney disease with heart failure and with stage 5 chronic kidney disease, or end stage renal disease: Secondary | ICD-10-CM | POA: Diagnosis not present

## 2021-01-05 DIAGNOSIS — N186 End stage renal disease: Secondary | ICD-10-CM | POA: Diagnosis not present

## 2021-01-05 DIAGNOSIS — I5023 Acute on chronic systolic (congestive) heart failure: Secondary | ICD-10-CM | POA: Diagnosis not present

## 2021-01-05 NOTE — Telephone Encounter (Signed)
Spoke with pt advised to call his pharmacy for refill on his Rx Reglan.

## 2021-01-05 NOTE — Telephone Encounter (Signed)
Spoke with pt wants to know if he can come to the lab to have his A1C done since this was not done at his last visit. Please advice

## 2021-01-06 DIAGNOSIS — N2581 Secondary hyperparathyroidism of renal origin: Secondary | ICD-10-CM | POA: Diagnosis not present

## 2021-01-06 DIAGNOSIS — Z992 Dependence on renal dialysis: Secondary | ICD-10-CM | POA: Diagnosis not present

## 2021-01-06 DIAGNOSIS — N186 End stage renal disease: Secondary | ICD-10-CM | POA: Diagnosis not present

## 2021-01-06 NOTE — Telephone Encounter (Signed)
I put in the A1c order

## 2021-01-06 NOTE — Telephone Encounter (Signed)
Pt has been scheduled for a lab appointment

## 2021-01-09 ENCOUNTER — Telehealth: Payer: Self-pay

## 2021-01-09 DIAGNOSIS — Z992 Dependence on renal dialysis: Secondary | ICD-10-CM | POA: Diagnosis not present

## 2021-01-09 DIAGNOSIS — N2581 Secondary hyperparathyroidism of renal origin: Secondary | ICD-10-CM | POA: Diagnosis not present

## 2021-01-09 DIAGNOSIS — R5381 Other malaise: Secondary | ICD-10-CM | POA: Diagnosis not present

## 2021-01-09 DIAGNOSIS — N186 End stage renal disease: Secondary | ICD-10-CM | POA: Diagnosis not present

## 2021-01-09 NOTE — Telephone Encounter (Signed)
The patient called because he has not received his wirex yet. I gave him the number to [place] support to get additional help.

## 2021-01-10 ENCOUNTER — Telehealth: Payer: Self-pay

## 2021-01-10 ENCOUNTER — Ambulatory Visit (INDEPENDENT_AMBULATORY_CARE_PROVIDER_SITE_OTHER): Payer: Medicare HMO

## 2021-01-10 ENCOUNTER — Other Ambulatory Visit: Payer: Self-pay

## 2021-01-10 ENCOUNTER — Other Ambulatory Visit (INDEPENDENT_AMBULATORY_CARE_PROVIDER_SITE_OTHER): Payer: Medicare HMO

## 2021-01-10 DIAGNOSIS — L89323 Pressure ulcer of left buttock, stage 3: Secondary | ICD-10-CM | POA: Diagnosis not present

## 2021-01-10 DIAGNOSIS — I132 Hypertensive heart and chronic kidney disease with heart failure and with stage 5 chronic kidney disease, or end stage renal disease: Secondary | ICD-10-CM | POA: Diagnosis not present

## 2021-01-10 DIAGNOSIS — M1030 Gout due to renal impairment, unspecified site: Secondary | ICD-10-CM | POA: Diagnosis not present

## 2021-01-10 DIAGNOSIS — I255 Ischemic cardiomyopathy: Secondary | ICD-10-CM | POA: Diagnosis not present

## 2021-01-10 DIAGNOSIS — D631 Anemia in chronic kidney disease: Secondary | ICD-10-CM | POA: Diagnosis not present

## 2021-01-10 DIAGNOSIS — N183 Chronic kidney disease, stage 3 unspecified: Secondary | ICD-10-CM | POA: Diagnosis not present

## 2021-01-10 DIAGNOSIS — E1122 Type 2 diabetes mellitus with diabetic chronic kidney disease: Secondary | ICD-10-CM | POA: Diagnosis not present

## 2021-01-10 DIAGNOSIS — N186 End stage renal disease: Secondary | ICD-10-CM | POA: Diagnosis not present

## 2021-01-10 DIAGNOSIS — Z794 Long term (current) use of insulin: Secondary | ICD-10-CM

## 2021-01-10 DIAGNOSIS — Z48812 Encounter for surgical aftercare following surgery on the circulatory system: Secondary | ICD-10-CM | POA: Diagnosis not present

## 2021-01-10 DIAGNOSIS — I5023 Acute on chronic systolic (congestive) heart failure: Secondary | ICD-10-CM | POA: Diagnosis not present

## 2021-01-10 DIAGNOSIS — H35033 Hypertensive retinopathy, bilateral: Secondary | ICD-10-CM | POA: Diagnosis not present

## 2021-01-10 LAB — HEMOGLOBIN A1C: Hgb A1c MFr Bld: 5.7 % (ref 4.6–6.5)

## 2021-01-10 NOTE — Telephone Encounter (Signed)
LMOVM that we did receive his transmission.

## 2021-01-11 DIAGNOSIS — Z992 Dependence on renal dialysis: Secondary | ICD-10-CM | POA: Diagnosis not present

## 2021-01-11 DIAGNOSIS — N186 End stage renal disease: Secondary | ICD-10-CM | POA: Diagnosis not present

## 2021-01-11 DIAGNOSIS — N2581 Secondary hyperparathyroidism of renal origin: Secondary | ICD-10-CM | POA: Diagnosis not present

## 2021-01-11 LAB — CUP PACEART REMOTE DEVICE CHECK
Battery Remaining Longevity: 135 mo
Battery Voltage: 3.08 V
Brady Statistic RV Percent Paced: 0.01 %
Date Time Interrogation Session: 20220607131234
HighPow Impedance: 28 Ohm
HighPow Impedance: 37 Ohm
Implantable Lead Implant Date: 20130603
Implantable Lead Location: 753860
Implantable Lead Model: 6947
Implantable Pulse Generator Implant Date: 20211129
Lead Channel Impedance Value: 285 Ohm
Lead Channel Impedance Value: 399 Ohm
Lead Channel Pacing Threshold Amplitude: 0.875 V
Lead Channel Pacing Threshold Pulse Width: 0.4 ms
Lead Channel Sensing Intrinsic Amplitude: 4.75 mV
Lead Channel Sensing Intrinsic Amplitude: 4.75 mV
Lead Channel Setting Pacing Amplitude: 2 V
Lead Channel Setting Pacing Pulse Width: 0.4 ms
Lead Channel Setting Sensing Sensitivity: 0.3 mV

## 2021-01-12 ENCOUNTER — Ambulatory Visit (HOSPITAL_COMMUNITY): Payer: Self-pay

## 2021-01-12 DIAGNOSIS — H35033 Hypertensive retinopathy, bilateral: Secondary | ICD-10-CM | POA: Diagnosis not present

## 2021-01-12 DIAGNOSIS — I132 Hypertensive heart and chronic kidney disease with heart failure and with stage 5 chronic kidney disease, or end stage renal disease: Secondary | ICD-10-CM | POA: Diagnosis not present

## 2021-01-12 DIAGNOSIS — E1122 Type 2 diabetes mellitus with diabetic chronic kidney disease: Secondary | ICD-10-CM | POA: Diagnosis not present

## 2021-01-12 DIAGNOSIS — Z48812 Encounter for surgical aftercare following surgery on the circulatory system: Secondary | ICD-10-CM | POA: Diagnosis not present

## 2021-01-12 DIAGNOSIS — N186 End stage renal disease: Secondary | ICD-10-CM | POA: Diagnosis not present

## 2021-01-12 DIAGNOSIS — I5023 Acute on chronic systolic (congestive) heart failure: Secondary | ICD-10-CM | POA: Diagnosis not present

## 2021-01-12 DIAGNOSIS — D631 Anemia in chronic kidney disease: Secondary | ICD-10-CM | POA: Diagnosis not present

## 2021-01-12 DIAGNOSIS — M1030 Gout due to renal impairment, unspecified site: Secondary | ICD-10-CM | POA: Diagnosis not present

## 2021-01-12 DIAGNOSIS — L89323 Pressure ulcer of left buttock, stage 3: Secondary | ICD-10-CM | POA: Diagnosis not present

## 2021-01-12 LAB — POCT INR: INR: 2.2 (ref 2.0–3.0)

## 2021-01-12 NOTE — Progress Notes (Signed)
LVAD INR 

## 2021-01-13 DIAGNOSIS — Z992 Dependence on renal dialysis: Secondary | ICD-10-CM | POA: Diagnosis not present

## 2021-01-13 DIAGNOSIS — N2581 Secondary hyperparathyroidism of renal origin: Secondary | ICD-10-CM | POA: Diagnosis not present

## 2021-01-13 DIAGNOSIS — N186 End stage renal disease: Secondary | ICD-10-CM | POA: Diagnosis not present

## 2021-01-16 DIAGNOSIS — N2581 Secondary hyperparathyroidism of renal origin: Secondary | ICD-10-CM | POA: Diagnosis not present

## 2021-01-16 DIAGNOSIS — N186 End stage renal disease: Secondary | ICD-10-CM | POA: Diagnosis not present

## 2021-01-16 DIAGNOSIS — Z992 Dependence on renal dialysis: Secondary | ICD-10-CM | POA: Diagnosis not present

## 2021-01-17 ENCOUNTER — Ambulatory Visit (HOSPITAL_COMMUNITY): Payer: Self-pay

## 2021-01-17 ENCOUNTER — Other Ambulatory Visit (HOSPITAL_COMMUNITY): Payer: Self-pay | Admitting: Cardiology

## 2021-01-17 DIAGNOSIS — Z95811 Presence of heart assist device: Secondary | ICD-10-CM

## 2021-01-17 DIAGNOSIS — Z7901 Long term (current) use of anticoagulants: Secondary | ICD-10-CM | POA: Diagnosis not present

## 2021-01-17 LAB — POCT INR: INR: 2.1 (ref 2.0–3.0)

## 2021-01-17 MED ORDER — OXYCODONE HCL 5 MG PO TABS: 5.0000 mg | ORAL_TABLET | Freq: Four times a day (QID) | ORAL | 0 refills | Status: DC | PRN

## 2021-01-17 NOTE — Progress Notes (Signed)
LVAD INR 

## 2021-01-18 DIAGNOSIS — N186 End stage renal disease: Secondary | ICD-10-CM | POA: Diagnosis not present

## 2021-01-18 DIAGNOSIS — Z992 Dependence on renal dialysis: Secondary | ICD-10-CM | POA: Diagnosis not present

## 2021-01-18 DIAGNOSIS — N2581 Secondary hyperparathyroidism of renal origin: Secondary | ICD-10-CM | POA: Diagnosis not present

## 2021-01-20 DIAGNOSIS — N2581 Secondary hyperparathyroidism of renal origin: Secondary | ICD-10-CM | POA: Diagnosis not present

## 2021-01-20 DIAGNOSIS — Z992 Dependence on renal dialysis: Secondary | ICD-10-CM | POA: Diagnosis not present

## 2021-01-20 DIAGNOSIS — N186 End stage renal disease: Secondary | ICD-10-CM | POA: Diagnosis not present

## 2021-01-23 DIAGNOSIS — N2581 Secondary hyperparathyroidism of renal origin: Secondary | ICD-10-CM | POA: Diagnosis not present

## 2021-01-23 DIAGNOSIS — Z992 Dependence on renal dialysis: Secondary | ICD-10-CM | POA: Diagnosis not present

## 2021-01-23 DIAGNOSIS — N186 End stage renal disease: Secondary | ICD-10-CM | POA: Diagnosis not present

## 2021-01-24 ENCOUNTER — Telehealth (HOSPITAL_COMMUNITY): Payer: Self-pay | Admitting: *Deleted

## 2021-01-24 ENCOUNTER — Ambulatory Visit (HOSPITAL_COMMUNITY): Payer: Self-pay

## 2021-01-24 DIAGNOSIS — Z7901 Long term (current) use of anticoagulants: Secondary | ICD-10-CM | POA: Diagnosis not present

## 2021-01-24 DIAGNOSIS — D171 Benign lipomatous neoplasm of skin and subcutaneous tissue of trunk: Secondary | ICD-10-CM | POA: Diagnosis not present

## 2021-01-24 DIAGNOSIS — Z992 Dependence on renal dialysis: Secondary | ICD-10-CM | POA: Diagnosis not present

## 2021-01-24 DIAGNOSIS — L918 Other hypertrophic disorders of the skin: Secondary | ICD-10-CM | POA: Diagnosis not present

## 2021-01-24 DIAGNOSIS — I509 Heart failure, unspecified: Secondary | ICD-10-CM | POA: Diagnosis not present

## 2021-01-24 LAB — POCT INR: INR: 1.4 — AB (ref 2.0–3.0)

## 2021-01-24 NOTE — Telephone Encounter (Signed)
Pt paged VAD coordinator stating he has a skin tag on the back on his left arm that has "been there all my life" that has been intermittently bleeding since Friday. Currently he has a bandaid in place. Denies pain. Denies that wound is bleeding through the bandaid. INR 1.4 today. Encouraged to make an appointment with his PCP or a dermatologist to assess skin tag. He states he plans to go to urgent care this afternoon to have area assessed. Kerby Nora PharmD and Dr Haroldine Laws made aware of the above.   Emerson Monte RN [place] Coordinator  Office: [phone]  24/7 Pager: [phone]

## 2021-01-24 NOTE — Progress Notes (Signed)
LVAD INR 

## 2021-01-25 ENCOUNTER — Telehealth (HOSPITAL_COMMUNITY): Payer: Self-pay | Admitting: Licensed Clinical Social Worker

## 2021-01-25 DIAGNOSIS — N186 End stage renal disease: Secondary | ICD-10-CM | POA: Diagnosis not present

## 2021-01-25 DIAGNOSIS — R233 Spontaneous ecchymoses: Secondary | ICD-10-CM | POA: Diagnosis not present

## 2021-01-25 DIAGNOSIS — Z992 Dependence on renal dialysis: Secondary | ICD-10-CM | POA: Diagnosis not present

## 2021-01-25 DIAGNOSIS — N2581 Secondary hyperparathyroidism of renal origin: Secondary | ICD-10-CM | POA: Diagnosis not present

## 2021-01-25 NOTE — Telephone Encounter (Signed)
CSW contacted patient to remind of [facility] meeting this Friday. Patient states he has dialysis on fridays and will be unable to attend. CSW shared LVAD Support Group on Tuesday and possibly he could make that group meeting. Patient will see what he can do and try to make the group on Tuesday.  Raquel Sarna, [place], [place]

## 2021-01-26 DIAGNOSIS — E1122 Type 2 diabetes mellitus with diabetic chronic kidney disease: Secondary | ICD-10-CM | POA: Diagnosis not present

## 2021-01-26 DIAGNOSIS — N186 End stage renal disease: Secondary | ICD-10-CM | POA: Diagnosis not present

## 2021-01-26 DIAGNOSIS — H35033 Hypertensive retinopathy, bilateral: Secondary | ICD-10-CM | POA: Diagnosis not present

## 2021-01-26 DIAGNOSIS — D631 Anemia in chronic kidney disease: Secondary | ICD-10-CM | POA: Diagnosis not present

## 2021-01-26 DIAGNOSIS — I5023 Acute on chronic systolic (congestive) heart failure: Secondary | ICD-10-CM | POA: Diagnosis not present

## 2021-01-26 DIAGNOSIS — Z48812 Encounter for surgical aftercare following surgery on the circulatory system: Secondary | ICD-10-CM | POA: Diagnosis not present

## 2021-01-26 DIAGNOSIS — L89323 Pressure ulcer of left buttock, stage 3: Secondary | ICD-10-CM | POA: Diagnosis not present

## 2021-01-26 DIAGNOSIS — M1030 Gout due to renal impairment, unspecified site: Secondary | ICD-10-CM | POA: Diagnosis not present

## 2021-01-26 DIAGNOSIS — I132 Hypertensive heart and chronic kidney disease with heart failure and with stage 5 chronic kidney disease, or end stage renal disease: Secondary | ICD-10-CM | POA: Diagnosis not present

## 2021-01-27 DIAGNOSIS — Z992 Dependence on renal dialysis: Secondary | ICD-10-CM | POA: Diagnosis not present

## 2021-01-27 DIAGNOSIS — N2581 Secondary hyperparathyroidism of renal origin: Secondary | ICD-10-CM | POA: Diagnosis not present

## 2021-01-27 DIAGNOSIS — N186 End stage renal disease: Secondary | ICD-10-CM | POA: Diagnosis not present

## 2021-01-29 ENCOUNTER — Telehealth (HOSPITAL_COMMUNITY): Payer: Self-pay | Admitting: *Deleted

## 2021-01-29 NOTE — Telephone Encounter (Signed)
Received page from patient this morning regarding persistent dry "hacking" cough. Denies fever, chills, and sinus pain/pressure. Sputum is "clear when I blow my nose, but I am not coughing anything up." Reports occasional shortness of breath, but he does not feel this is not necessarily cough related. Weights "have been stable." Denies swelling. States he does not feel poorly, but has "had this cough for a while." He has been taking Mucinex, but does not feel like this is working. Instructed him to try Delsym or plain Robitussin, or refer to his cold/cough med list in his discharge binder for other medication options. He asked about getting an order for home O2. I explained that he would need to qualify for home O2 based off clinical findings, and that we could assess for that need in clinic. Offered to bring him to clinic tomorrow to assess fluid and O2 status, and pt denied, stating "I have too much going on." Instructed to call VAD clinic or pager if he decides he would like to be seen, or his symptoms worsen. He verbalized understanding.   Emerson Monte RN [place] Coordinator  Office: [phone]  24/7 Pager: [phone]

## 2021-01-30 DIAGNOSIS — N2581 Secondary hyperparathyroidism of renal origin: Secondary | ICD-10-CM | POA: Diagnosis not present

## 2021-01-30 DIAGNOSIS — Z992 Dependence on renal dialysis: Secondary | ICD-10-CM | POA: Diagnosis not present

## 2021-01-30 DIAGNOSIS — N186 End stage renal disease: Secondary | ICD-10-CM | POA: Diagnosis not present

## 2021-01-31 ENCOUNTER — Encounter (HOSPITAL_COMMUNITY): Payer: Medicare HMO

## 2021-01-31 ENCOUNTER — Ambulatory Visit (HOSPITAL_COMMUNITY): Payer: Self-pay

## 2021-01-31 DIAGNOSIS — Z20822 Contact with and (suspected) exposure to covid-19: Secondary | ICD-10-CM | POA: Diagnosis not present

## 2021-01-31 LAB — POCT INR: INR: 2.7 (ref 2.0–3.0)

## 2021-01-31 NOTE — Progress Notes (Signed)
LVAD INR 

## 2021-02-01 DIAGNOSIS — Z992 Dependence on renal dialysis: Secondary | ICD-10-CM | POA: Diagnosis not present

## 2021-02-01 DIAGNOSIS — N186 End stage renal disease: Secondary | ICD-10-CM | POA: Diagnosis not present

## 2021-02-01 DIAGNOSIS — N2581 Secondary hyperparathyroidism of renal origin: Secondary | ICD-10-CM | POA: Diagnosis not present

## 2021-02-01 NOTE — Progress Notes (Signed)
Remote ICD transmission.   

## 2021-02-02 ENCOUNTER — Telehealth (HOSPITAL_COMMUNITY): Payer: Self-pay

## 2021-02-02 DIAGNOSIS — I5023 Acute on chronic systolic (congestive) heart failure: Secondary | ICD-10-CM | POA: Diagnosis not present

## 2021-02-02 DIAGNOSIS — N186 End stage renal disease: Secondary | ICD-10-CM | POA: Diagnosis not present

## 2021-02-02 DIAGNOSIS — D631 Anemia in chronic kidney disease: Secondary | ICD-10-CM | POA: Diagnosis not present

## 2021-02-02 DIAGNOSIS — I509 Heart failure, unspecified: Secondary | ICD-10-CM | POA: Diagnosis not present

## 2021-02-02 DIAGNOSIS — M1030 Gout due to renal impairment, unspecified site: Secondary | ICD-10-CM | POA: Diagnosis not present

## 2021-02-02 DIAGNOSIS — E1122 Type 2 diabetes mellitus with diabetic chronic kidney disease: Secondary | ICD-10-CM | POA: Diagnosis not present

## 2021-02-02 DIAGNOSIS — H35033 Hypertensive retinopathy, bilateral: Secondary | ICD-10-CM | POA: Diagnosis not present

## 2021-02-02 DIAGNOSIS — I132 Hypertensive heart and chronic kidney disease with heart failure and with stage 5 chronic kidney disease, or end stage renal disease: Secondary | ICD-10-CM | POA: Diagnosis not present

## 2021-02-02 DIAGNOSIS — Z992 Dependence on renal dialysis: Secondary | ICD-10-CM | POA: Diagnosis not present

## 2021-02-02 DIAGNOSIS — Z48812 Encounter for surgical aftercare following surgery on the circulatory system: Secondary | ICD-10-CM | POA: Diagnosis not present

## 2021-02-02 DIAGNOSIS — L89323 Pressure ulcer of left buttock, stage 3: Secondary | ICD-10-CM | POA: Diagnosis not present

## 2021-02-02 NOTE — Telephone Encounter (Signed)
Pt insurance is active and benefits verified through [place] $10, DED 0/0 met, out of pocket $3,900/$1,906.01 met, co-insurance 0%. no pre-authorization required. Passport, 02/02/2021@8 :55am, REF# [id number]   Will contact patient to see if he is interested in the Cardiac Rehab Program. If interested, patient will need to complete follow up appt. Once completed, patient will be contacted for scheduling upon review by the RN Navigator.

## 2021-02-02 NOTE — Telephone Encounter (Signed)
Attempted to call patient in regards to Cardiac Rehab - LM on VM 

## 2021-02-03 DIAGNOSIS — N2581 Secondary hyperparathyroidism of renal origin: Secondary | ICD-10-CM | POA: Diagnosis not present

## 2021-02-03 DIAGNOSIS — Z992 Dependence on renal dialysis: Secondary | ICD-10-CM | POA: Diagnosis not present

## 2021-02-03 DIAGNOSIS — N186 End stage renal disease: Secondary | ICD-10-CM | POA: Diagnosis not present

## 2021-02-06 DIAGNOSIS — N2581 Secondary hyperparathyroidism of renal origin: Secondary | ICD-10-CM | POA: Diagnosis not present

## 2021-02-06 DIAGNOSIS — N186 End stage renal disease: Secondary | ICD-10-CM | POA: Diagnosis not present

## 2021-02-06 DIAGNOSIS — Z992 Dependence on renal dialysis: Secondary | ICD-10-CM | POA: Diagnosis not present

## 2021-02-07 ENCOUNTER — Ambulatory Visit (HOSPITAL_COMMUNITY)
Admission: RE | Admit: 2021-02-07 | Discharge: 2021-02-07 | Disposition: A | Discharge status: Home / Self Care | Payer: Medicare HMO | Source: Ambulatory Visit | Attending: Cardiology | Admitting: Cardiology

## 2021-02-07 ENCOUNTER — Other Ambulatory Visit (HOSPITAL_COMMUNITY): Payer: Self-pay | Admitting: Unknown Physician Specialty

## 2021-02-07 ENCOUNTER — Other Ambulatory Visit: Payer: Self-pay

## 2021-02-07 ENCOUNTER — Ambulatory Visit (HOSPITAL_COMMUNITY): Payer: Self-pay

## 2021-02-07 VITALS — BP 114/0 | HR 95 | Temp 98.2°F | Ht 70.0 in | Wt 180.2 lb

## 2021-02-07 DIAGNOSIS — R053 Chronic cough: Secondary | ICD-10-CM | POA: Diagnosis not present

## 2021-02-07 DIAGNOSIS — J90 Pleural effusion, not elsewhere classified: Secondary | ICD-10-CM | POA: Insufficient documentation

## 2021-02-07 DIAGNOSIS — Z992 Dependence on renal dialysis: Secondary | ICD-10-CM | POA: Insufficient documentation

## 2021-02-07 DIAGNOSIS — K3184 Gastroparesis: Secondary | ICD-10-CM | POA: Insufficient documentation

## 2021-02-07 DIAGNOSIS — Z951 Presence of aortocoronary bypass graft: Secondary | ICD-10-CM | POA: Diagnosis not present

## 2021-02-07 DIAGNOSIS — R0602 Shortness of breath: Secondary | ICD-10-CM | POA: Diagnosis not present

## 2021-02-07 DIAGNOSIS — E1136 Type 2 diabetes mellitus with diabetic cataract: Secondary | ICD-10-CM | POA: Insufficient documentation

## 2021-02-07 DIAGNOSIS — I4891 Unspecified atrial fibrillation: Secondary | ICD-10-CM | POA: Insufficient documentation

## 2021-02-07 DIAGNOSIS — Z95811 Presence of heart assist device: Secondary | ICD-10-CM | POA: Diagnosis not present

## 2021-02-07 DIAGNOSIS — Z4801 Encounter for change or removal of surgical wound dressing: Secondary | ICD-10-CM | POA: Diagnosis not present

## 2021-02-07 DIAGNOSIS — I951 Orthostatic hypotension: Secondary | ICD-10-CM | POA: Insufficient documentation

## 2021-02-07 DIAGNOSIS — I132 Hypertensive heart and chronic kidney disease with heart failure and with stage 5 chronic kidney disease, or end stage renal disease: Secondary | ICD-10-CM | POA: Insufficient documentation

## 2021-02-07 DIAGNOSIS — Z794 Long term (current) use of insulin: Secondary | ICD-10-CM | POA: Diagnosis not present

## 2021-02-07 DIAGNOSIS — J811 Chronic pulmonary edema: Secondary | ICD-10-CM | POA: Insufficient documentation

## 2021-02-07 DIAGNOSIS — Z8616 Personal history of COVID-19: Secondary | ICD-10-CM | POA: Insufficient documentation

## 2021-02-07 DIAGNOSIS — E1142 Type 2 diabetes mellitus with diabetic polyneuropathy: Secondary | ICD-10-CM | POA: Diagnosis not present

## 2021-02-07 DIAGNOSIS — R531 Weakness: Secondary | ICD-10-CM | POA: Insufficient documentation

## 2021-02-07 DIAGNOSIS — I4892 Unspecified atrial flutter: Secondary | ICD-10-CM | POA: Insufficient documentation

## 2021-02-07 DIAGNOSIS — Z79899 Other long term (current) drug therapy: Secondary | ICD-10-CM | POA: Insufficient documentation

## 2021-02-07 DIAGNOSIS — R110 Nausea: Secondary | ICD-10-CM | POA: Diagnosis not present

## 2021-02-07 DIAGNOSIS — Z7901 Long term (current) use of anticoagulants: Secondary | ICD-10-CM | POA: Diagnosis not present

## 2021-02-07 DIAGNOSIS — I255 Ischemic cardiomyopathy: Secondary | ICD-10-CM | POA: Diagnosis not present

## 2021-02-07 DIAGNOSIS — I2510 Atherosclerotic heart disease of native coronary artery without angina pectoris: Secondary | ICD-10-CM | POA: Insufficient documentation

## 2021-02-07 LAB — COMPREHENSIVE METABOLIC PANEL
ALT: 12 U/L (ref 0–44)
AST: 22 U/L (ref 15–41)
Albumin: 3.4 g/dL — ABNORMAL LOW (ref 3.5–5.0)
Alkaline Phosphatase: 141 U/L — ABNORMAL HIGH (ref 38–126)
Anion gap: 11 (ref 5–15)
BUN: 21 mg/dL — ABNORMAL HIGH (ref 6–20)
CO2: 19 mmol/L — ABNORMAL LOW (ref 22–32)
Calcium: 9.8 mg/dL (ref 8.9–10.3)
Chloride: 106 mmol/L (ref 98–111)
Creatinine, Ser: 3.43 mg/dL — ABNORMAL HIGH (ref 0.61–1.24)
GFR, Estimated: 20 mL/min — ABNORMAL LOW (ref >60)
Glucose, Bld: 146 mg/dL — ABNORMAL HIGH (ref 70–99)
Potassium: 3.9 mmol/L (ref 3.5–5.1)
Sodium: 136 mmol/L (ref 135–145)
Total Bilirubin: 1.3 mg/dL — ABNORMAL HIGH (ref 0.3–1.2)
Total Protein: 8.8 g/dL — ABNORMAL HIGH (ref 6.5–8.1)

## 2021-02-07 LAB — CBC
HCT: 44 % (ref 39.0–52.0)
Hemoglobin: 13 g/dL (ref 13.0–17.0)
MCH: 23.7 pg — ABNORMAL LOW (ref 26.0–34.0)
MCHC: 29.5 g/dL — ABNORMAL LOW (ref 30.0–36.0)
MCV: 80.3 fL (ref 80.0–100.0)
Platelets: 203 10*3/uL (ref 150–400)
RBC: 5.48 MIL/uL (ref 4.22–5.81)
RDW: 17.8 % — ABNORMAL HIGH (ref 11.5–15.5)
WBC: 7.3 10*3/uL (ref 4.0–10.5)
nRBC: 0 % (ref 0.0–0.2)

## 2021-02-07 LAB — POCT INR: INR: 3.7 — AB (ref 2.0–3.0)

## 2021-02-07 LAB — PROTIME-INR
INR: 2.8 — ABNORMAL HIGH (ref 0.8–1.2)
Prothrombin Time: 29.4 seconds — ABNORMAL HIGH (ref 11.4–15.2)

## 2021-02-07 LAB — LACTATE DEHYDROGENASE: LDH: 178 U/L (ref 98–192)

## 2021-02-07 MED ORDER — DOXYCYCLINE HYCLATE 100 MG PO CAPS: 100.0000 mg | ORAL_CAPSULE | Freq: Two times a day (BID) | ORAL | 0 refills | Status: AC

## 2021-02-07 MED ORDER — MIDODRINE HCL 10 MG PO TABS: 10.0000 mg | ORAL_TABLET | Freq: Three times a day (TID) | ORAL | 3 refills | Status: DC

## 2021-02-07 MED ORDER — BENZONATATE 100 MG PO CAPS: 100.0000 mg | ORAL_CAPSULE | Freq: Three times a day (TID) | ORAL | 0 refills | Status: DC | PRN

## 2021-02-07 NOTE — Patient Instructions (Addendum)
Decrease Midodrine to 5 mg on NON-dialysis days only Start Tessalon pearles as needed for cough Start Doxycycline 100 mg twice a day for 10 days Please start Cardiac rehab We obtained a chest xray today we will call you with results Return to clinic in 2 weeks

## 2021-02-07 NOTE — Progress Notes (Addendum)
Patient presents for sick visit in [facility] today with brother [name]) and his son. Reports no problems with VAD equipment or concerns with drive line.  Patient arrived in w/c but walked to scales pt is very weak. Pt states that he fell yesterday out of his chair from the dining room table. He isn't sure if he passed out or not.  Pt paged VAD pager over the weekend for increased cough, SOB and N/V. Pt was instructed to take a covid test. Covid test was negative. Pt paged again this morning with the same issues, so a clinic visit was scheduled.   Pt had 1 day last week that he was very nauseous but this past and he hasn't had anymore issues.   Patient reports he continues M/W/F dialysis with established dry weight of 180 lbs. Patient reports his wt does not vary "much" from his goal weight.   Patient reports he is still taking Oxy occasionally at night for right hand pain. He does report improvement in pain since increasing gabapentin dose to 100 mg twice daily as instructed last clinic visit, but still has pain at night. Pt has not taken any Oxy in the last 3-4 days.   EKG obtained Dr. Aundra Dubin reviewed.   Chest xray obtained: 1. Increasing LEFT effusion. 2. Mild pulmonary edema pattern.  I called the dialysis center and spoke with the nurse. I have faxed orders for the physician to consider dropping dry weight due to pt being volume overloaded in clinic today and on xray. I have included xray for Dr. Louie Boston review.   Vital Signs:  Temp: 98.2 Doppler Pressure: 114 Automatc BP: 115/82 (100)  HR:  95 SPO2: 96   Weight: 180.2 lb w/ eqt Last wt: 181.6 lb    VAD Indication: Destination Therapy due to CKD Stage 3b   LVAD assessment:  HM III:: VAD Speed: 5200 rpms Flow:  4 Power: 4 w    PI:  7.3 Alarms: none Events: rare  Fixed speed:  5200 Low speed limit:  4900  Primary controller: back up battery due for replacement in 27 months Secondary controller:  back up battery  due for replacement in 27 months   I reviewed the LVAD parameters from today and compared the results to the patient's prior recorded data. LVAD interrogation was NEGATIVE for significant power changes, NEGATIVE for clinical alarms and STABLE for PI events/speed drops. No programming changes were made and pump is functioning within specified parameters. Pt is performing daily controller and system monitor self tests along with completing weekly and monthly maintenance for LVAD equipment.   LVAD equipment check completed and is in good working order. Back-up equipment present.    Annual Equipment Maintenance on UBC/PM was performed 3/22.  Exit Site Care: VAD dressing and anchor removed and site care performed using sterile technique. Drive line exit site cleaned with Chlora prep applicators x 2, allowed to dry before Sorbaview dressing with bio patch applied. Exit site near total incorporation with scant amount light brown drainage with no foul odor. The velour is fully implanted at exit site. No redness or tenderness noted. Drive line anchor re-applied. Pt denies fever or chills. Driveline dressing is being changed once weekly per sterile technique by son [name]). Will advance to weekly dressing changes, instructed patient and brother to go back to gauze dressings twice weekly if drainage occurs under sorbaview window; also asked them to call us if this occurs. Provided with 4 weekly dressing kits; patient has daily kits  available at home.   Device: Medtronic single Therapies: on 150 bpm Pacing: VVI 40 Last check:  11/09/20   BP & Labs:  Doppler 114 - Doppler is reflecting MAP   Hgb 13  - No S/S of bleeding. Specifically denies melena/BRBPR or nosebleeds.   LDH  Pending - established baseline of 160 - 180. Denies tea-colored urine. No power elevations noted on interrogation.    Patient Instructions: Decrease Midodrine to 5 mg on NON-dialysis days only Start Tessalon pearles as needed for  cough Start Doxycycline 100 mg twice a day for 10 days Please start Cardiac rehab We obtained a chest xray today we will call you with results Return to clinic in 2 weeks  Tanda Rockers BSN, RN VAD Coordinator  Office: [phone] 24/7 Emergency VAD Pager: [phone]    Follow up for Heart Failure/LVAD:  Gawain Crombie. is a 60 y.o. male who has a h/o DM2, CAD s/p CABG 4562, systolic HF due to ischemic cardiomyopathy with EF 20-25% (echo 12/15), DM2 and CKD. He is s/p Medtronic single chamber ICD.   Admitted in 12/15 due to ADHF. Required short course milrinone for diuresis. Diuresed 30 pounds.    CPX 2/16 showed severely reduced functional capacity.  There was a significant disconnect between his symptoms and the CPX.  RHC in 6/16 showed fairly normal filling pressures and low but not markedly low cardiac output.    CPX (4/19) was submaximal but suggested severe limitation from HF.    He was admitted in 6/19 with marked volume overload and dyspnea.  Echo in 6/19 showed EF 20% with severe global hypokinesis.  LHC/RHC showed severe native vessel CAD with patent LIMA-D, SVG-OM, and SVG-PLV; cardiac index low.  He was started on milrinone and diuresed.  We discussed LVAD extensively in light of low output, creatinine that is trending up and concern for decompensation of RV over time. He was adamant that he did not want to pursue LVAD workup yet and did not want a referral for transplant evaluation. Milrinone was weaned off and he was discharged home.    Admitted 08/01/18-08/07/18 with volume overload and left foot wound. Diuresed 38 lbs with IV lasix and then transitioned back to torsemide 100 mg BID. ID was consulted with concerns for left foot osteomyelitis noted on CT scan. He got IV ancef q8 hours x 42 days. [facility] provided teaching and supplies for home antibiotics. Course complicated by AKI. DC weight 200 lbs.    He went into atrial flutter and had DCCV to NSR in 1/20.    Echo in 4/21  showed EF 25% with apical akinesis and diffuse hypokinesis relatively preserved in lateral wall, mildly decreased RV systolic function, PASP 41 mmHg. CPX in 5/21 showed severe functional limitation suggestive of advanced HF. 5/21 Zio patch showed short atrial flutter runs, 3.8% PVCs.    He developed COVID-19 infection in 1/22.  Symptoms were predominantly GI.    He developed progressive worsening of CHF in early 2022 and was admitted in 3/22 with low output HF.  Impella 5.5 was placed as bridge to Heartmate III LVAD placed in 10/13/20.  He developed post-op renal failure and ended up on dialysis.  He was markedly deconditioned post-op and went to CIR.  Now he is at home.   Today he returns for HF follow up. He has felt bad for about 3 wks.  He has been weak and more short of breath.  He has had a chronic cough.  MAP elevated  today at 100.  No fever, COVID-19 negative x 2.  Weight has been stable.  LVAD parameters stable.    I reviewed the LVAD parameters from today, and compared the results to the patient's prior recorded data.  No programming changes were made.  The LVAD is functioning within specified parameters.  The patient performs LVAD self-test daily.  LVAD interrogation was negative for any significant power changes, alarms or PI events/speed drops.  LVAD equipment check completed and is in good working order.  Back-up equipment present.   LVAD education done on emergency procedures and precautions and reviewed exit site care.  Labs (5/22): LDH 174, hgb 10.6  LVAD Interrogation HM III: See LVAD coordinator's note above   Past Medical History:  Diagnosis Date   AICD (automatic cardioverter/defibrillator) present    Single-chamber  implantable cardiac defibrillator - Medtronic   Atrial fibrillation ([place])    Cataract    Mixed form OU   CHF (congestive heart failure) ([place])    Chronic kidney disease    Chronic kidney disease (CKD), stage III (moderate) (HCC)    Chronic systolic heart  failure ([place])    a. Echo 5/13: Mild LVE, mild LVH, EF 10%, anteroseptal, lateral, apical AK, mild MR, mild LAE, moderately reduced RVSF, mild RAE, PASP 60;  b. 07/2014 Echo: EF 20-2%, diff HK, AKI of antsep/apical/mid-apicalinferior, mod reduced RV.   Coronary artery disease    a. s/p CABG 2002 b. LHC 5/13:  dLM 80%, LAD subtotally occluded, pCFX occluded, pRCA 50%, mid? Occlusion with high take off of the PDA with 70% multiple lesions-not bypassed and supplies collaterals to LAD, LIMA-IM/ramus ok, S-OM ok, S-PLA branches ok. Medical therapy was recommended   COVID    Diabetic retinopathy ([place])    NPDR OD, PDR OS   Dyspnea    Gout    "on daily RX" (01/08/2018)   Hypertension    Hypertensive retinopathy    OU   Ischemic cardiomyopathy    a. Echo 5/13: Mild LVE, mild LVH, EF 10%, anteroseptal, lateral, apical AK, mild MR, mild LAE, moderately reduced RVSF, mild RAE, PASP 60;  b. 01/2012 s/p MDT [id number] Protecta XT VR AICD;  c. 07/2014 Echo: EF 20-2%, diff HK, AKI of antsep/apical/mid-apicalinferior, mod reduced RV.   MRSA (methicillin resistant Staphylococcus aureus)    Status post right foot plantar deep infection with MRSA status post  I&D 10/2008   Myocardial infarction [facility])    "was told I'd had several before heart OR in 2002" (01/08/2018)   Noncompliance    Peripheral neuropathy    Retinopathy, diabetic, background ([place])    Syncope    Type II diabetes mellitus ([place])    requiring insulin    Vitreous hemorrhage, left (HCC)    and proliferative diabetic retinopathy    Current Outpatient Medications  Medication Sig Dispense Refill   ascorbic acid (VITAMIN C) 1000 MG tablet Take 1 tablet (1,000 mg total) by mouth daily. 90 tablet 3   benzonatate (TESSALON PERLES) 100 MG capsule Take 1 capsule (100 mg total) by mouth 3 (three) times daily as needed for cough. 20 capsule 0   doxycycline (VIBRAMYCIN) 100 MG capsule Take 1 capsule (100 mg total) by mouth 2 (two) times daily for 10 days. 20  capsule 0   gabapentin (NEURONTIN) 100 MG capsule Take one tablet in am and 2 tablets in evening 90 capsule 11   insulin glargine (LANTUS) 100 UNIT/ML injection Inject 0.08 mLs (8 Units total) into the skin daily. 10  mL 11   Menthol-Methyl Salicylate (MUSCLE RUB) 10-15 % CREA Apply 1 application topically 2 (two) times daily. To right shoulder--avoid IV site 85 g 0   metoCLOPramide (REGLAN) 10 MG tablet Take 1 tablet (10 mg total) by mouth 3 (three) times daily before meals. 90 tablet 11   multivitamin (RENA-VIT) TABS tablet Take 1 tablet by mouth at bedtime. 90 tablet 3   oxyCODONE (OXY IR/ROXICODONE) 5 MG immediate release tablet Take 1 tablet (5 mg total) by mouth every 6 (six) hours as needed for severe pain. 24 tablet 0   pantoprazole (PROTONIX) 40 MG tablet Take 1 tablet (40 mg total) by mouth daily. 90 tablet 3   rosuvastatin (CRESTOR) 10 MG tablet Take 1 tablet (10 mg total) by mouth daily. 30 tablet 11   senna-docusate (SENOKOT-S) 8.6-50 MG tablet Take 2 tablets by mouth 2 (two) times daily. 120 tablet 0   warfarin (COUMADIN) 3 MG tablet Take 3 mg daily or as directed 120 tablet 3   acetaminophen (TYLENOL) 325 MG tablet Take 1-2 tablets (325-650 mg total) by mouth every 4 (four) hours as needed for mild pain. (Patient not taking: Reported on 02/07/2021)     midodrine (PROAMATINE) 10 MG tablet Take 1 tablet (10 mg total) by mouth 3 (three) times daily with meals. Take 0.5 tablet tid on NON dialysis days 270 tablet 3   polyethylene glycol powder (GLYCOLAX/MIRALAX) 17 GM/SCOOP powder Dissolve 1 capful in water and drink 2 (two) times daily. (Patient not taking: Reported on 02/07/2021) 510 g 0   No current facility-administered medications for this encounter.    Meclizine hcl and Ivabradine  REVIEW OF SYSTEMS: All systems negative except as listed in HPI, PMH and Problem list.   LVAD INTERROGATION:  Please see LVAD nurse's note above.   I reviewed the LVAD parameters from today, and  compared the results to the patient's prior recorded data.  No programming changes were made.  The LVAD is functioning within specified parameters.  The patient performs LVAD self-test daily.  LVAD interrogation was negative for any significant power changes, alarms or PI events/speed drops.  LVAD equipment check completed and is in good working order.  Back-up equipment present.   LVAD education done on emergency procedures and precautions and reviewed exit site care.    Vitals:   02/07/21 1114 02/07/21 1115  BP: 125/82 (!) 114/0  Pulse: 95   Temp: 98.2 F (36.8 C)   SpO2: 96%   Weight: 81.7 kg (180 lb 3.2 oz)   Height: 5\' 10"  (1.778 m)      Physical Exam: General: Well appearing this am. NAD.  HEENT: Normal. Neck: Supple, JVP 14 cm. Carotids OK.  Cardiac:  Mechanical heart sounds with LVAD hum present.  Lungs:  CTAB, normal effort.  Abdomen:  NT, ND, no HSM. No bruits or masses. +BS  LVAD exit site: Well-healed and incorporated. Dressing dry and intact. No erythema or drainage. Stabilization device present and accurately applied. Driveline dressing changed daily per sterile technique. Extremities:  Warm and dry. No cyanosis, clubbing, rash, or edema.  Neuro:  Alert & oriented x 3. Cranial nerves grossly intact. Moves all 4 extremities w/o difficulty. Affect pleasant    ASSESSMENT AND PLAN:  1. Chronic systolic CHF:  Ischemic cardiomyopathy. Has Medtronic ICD. Low output HF, admitted and Impella 5.5 placed 10/04/20.  HM3 LVAD was placed on 10/13/20. Developed post-op renal failure requiring eventual hemodialysis.  Patient has had 3 wks of worsening dyspnea and cough.  He is volume overloaded on exam today, this is probably the cause of his symptoms.  - I think that he needs more fluid off with HD, will contact his nephrologist.  - CXR today.  - Will give a course of doxycycline 100 mg tid for ?bronchitis (though suspect more likely symptoms are due to volume overload) and will give him  tessalon for cough.  - MAP elevated, decrease midodrine to 5 mg tid on non-HD days.  2. LVAD:  VAD interrogated personally. Parameters stable with rare PI events.  - He is now off ASA.  - Continue warfarin with INR goal 2-2.5.   3. CAD s/p CABG 2002:  Last cath in 6/19 with patent grafts. No chest pain.  - Continue Crestor.  4. ESRD: Currently dialyzing via tunneled catheter.  - Continue HD M-W-F  - No low flows.  - With continuous flow LVAD, think AV fistula would be less likely to mature.  Will need to place AV graft at some point in the future.  5. Atrial flutter/fibrillation: s/p DC-CV 08/21/18.  NSR today.  6. Right hand: Likely neuropathic from stretch of brachial plexus with Impella placement.  - Continue gabapentin 7. DM: Per primary care.  8. Anemia: Hgb pending.  9. Deconditioning:  He will start cardiac rehab.  10. Suspected Gastroparesis. Nausea improved on Reglan.   Follow up in 2 wks.   Loralie Champagne 02/07/2021   Patient seen with NP, agree with the above note.   He is doing well today, working with PT and gradually increasing activity.  Able to walk up the 3 flights to his apartment.  MAP is stable with midodrine, no lightheadedness.  Tolerating HD well so far.   General: NAD Neck: No JVD, no thyromegaly or thyroid nodule.  Lungs: Clear to auscultation bilaterally with normal respiratory effort. CV: Nondisplaced PMI.  Heart regular S1/S2, no S3/S4, no murmur.  No peripheral edema.  No carotid bruit.  Normal pedal pulses.  Abdomen: Soft, nontender, no hepatosplenomegaly, no distention.  Skin: Intact without lesions or rashes.  Neurologic: Alert and oriented x 3.  Psych: Normal affect. Extremities: No clubbing or cyanosis.  HEENT: Normal.   Continue midodrine.    Continue warfarin with INR goal 2-2.5, now off ASA 81.   Increase gabapentin for neuropathic right hand pain.   Short vertigo episodes still when he lies down, these are tolerable.   Nausea much  better, can stop erythromycin but continue Reglan.    Loralie Champagne 02/07/2021

## 2021-02-07 NOTE — Progress Notes (Signed)
LVAD INR 

## 2021-02-08 DIAGNOSIS — R5381 Other malaise: Secondary | ICD-10-CM | POA: Diagnosis not present

## 2021-02-10 DIAGNOSIS — N186 End stage renal disease: Secondary | ICD-10-CM | POA: Diagnosis not present

## 2021-02-10 DIAGNOSIS — Z992 Dependence on renal dialysis: Secondary | ICD-10-CM | POA: Diagnosis not present

## 2021-02-10 DIAGNOSIS — N2581 Secondary hyperparathyroidism of renal origin: Secondary | ICD-10-CM | POA: Diagnosis not present

## 2021-02-13 DIAGNOSIS — N2581 Secondary hyperparathyroidism of renal origin: Secondary | ICD-10-CM | POA: Diagnosis not present

## 2021-02-13 DIAGNOSIS — Z992 Dependence on renal dialysis: Secondary | ICD-10-CM | POA: Diagnosis not present

## 2021-02-13 DIAGNOSIS — N186 End stage renal disease: Secondary | ICD-10-CM | POA: Diagnosis not present

## 2021-02-14 ENCOUNTER — Encounter (HOSPITAL_COMMUNITY): Payer: Medicare HMO

## 2021-02-15 DIAGNOSIS — Z992 Dependence on renal dialysis: Secondary | ICD-10-CM | POA: Diagnosis not present

## 2021-02-15 DIAGNOSIS — N2581 Secondary hyperparathyroidism of renal origin: Secondary | ICD-10-CM | POA: Diagnosis not present

## 2021-02-15 DIAGNOSIS — N186 End stage renal disease: Secondary | ICD-10-CM | POA: Diagnosis not present

## 2021-02-16 ENCOUNTER — Ambulatory Visit (HOSPITAL_COMMUNITY): Payer: Self-pay

## 2021-02-16 LAB — POCT INR: INR: 2.9 (ref 2.0–3.0)

## 2021-02-16 NOTE — Progress Notes (Signed)
LVAD INR 

## 2021-02-17 DIAGNOSIS — N2581 Secondary hyperparathyroidism of renal origin: Secondary | ICD-10-CM | POA: Diagnosis not present

## 2021-02-17 DIAGNOSIS — Z992 Dependence on renal dialysis: Secondary | ICD-10-CM | POA: Diagnosis not present

## 2021-02-17 DIAGNOSIS — N186 End stage renal disease: Secondary | ICD-10-CM | POA: Diagnosis not present

## 2021-02-19 ENCOUNTER — Other Ambulatory Visit (HOSPITAL_COMMUNITY): Payer: Self-pay | Admitting: Cardiology

## 2021-02-19 DIAGNOSIS — I5042 Chronic combined systolic (congestive) and diastolic (congestive) heart failure: Secondary | ICD-10-CM

## 2021-02-20 ENCOUNTER — Other Ambulatory Visit (HOSPITAL_COMMUNITY): Payer: Self-pay | Admitting: Unknown Physician Specialty

## 2021-02-20 ENCOUNTER — Telehealth (HOSPITAL_COMMUNITY): Payer: Self-pay | Admitting: *Deleted

## 2021-02-20 DIAGNOSIS — Z992 Dependence on renal dialysis: Secondary | ICD-10-CM | POA: Diagnosis not present

## 2021-02-20 DIAGNOSIS — Z95811 Presence of heart assist device: Secondary | ICD-10-CM

## 2021-02-20 DIAGNOSIS — J90 Pleural effusion, not elsewhere classified: Secondary | ICD-10-CM

## 2021-02-20 DIAGNOSIS — N2581 Secondary hyperparathyroidism of renal origin: Secondary | ICD-10-CM | POA: Diagnosis not present

## 2021-02-20 DIAGNOSIS — N186 End stage renal disease: Secondary | ICD-10-CM | POA: Diagnosis not present

## 2021-02-20 NOTE — Telephone Encounter (Signed)
Spoke with patient's son [name] regarding plan for pt's thoracentesis tomorrow. (Pt is currently receiving dialysis.) Plan for pt to arrive at admitting at [phone] for scheduled thoracentesis at 10:00. He verbalized understanding.  Emerson Monte RN [place] Coordinator  Office: [phone]  24/7 Pager: [phone]

## 2021-02-20 NOTE — Addendum Note (Signed)
Addended by: Tanda Rockers B on: 02/20/2021 04:59 PM   Modules accepted: Orders

## 2021-02-21 ENCOUNTER — Ambulatory Visit (HOSPITAL_COMMUNITY): Payer: Self-pay

## 2021-02-21 ENCOUNTER — Other Ambulatory Visit (HOSPITAL_COMMUNITY): Payer: Medicare HMO

## 2021-02-21 ENCOUNTER — Other Ambulatory Visit (HOSPITAL_COMMUNITY): Payer: Self-pay | Admitting: Physician Assistant

## 2021-02-21 ENCOUNTER — Other Ambulatory Visit: Payer: Self-pay

## 2021-02-21 ENCOUNTER — Ambulatory Visit (HOSPITAL_COMMUNITY)
Admission: RE | Admit: 2021-02-21 | Discharge: 2021-02-21 | Disposition: A | Discharge status: Home / Self Care | Payer: Medicare HMO | Source: Ambulatory Visit | Attending: Cardiology | Admitting: Cardiology

## 2021-02-21 ENCOUNTER — Ambulatory Visit (HOSPITAL_COMMUNITY)
Admission: RE | Admit: 2021-02-21 | Discharge: 2021-02-21 | Disposition: A | Discharge status: Home / Self Care | Payer: Medicare HMO | Source: Ambulatory Visit | Attending: Physician Assistant | Admitting: Physician Assistant

## 2021-02-21 DIAGNOSIS — Z48813 Encounter for surgical aftercare following surgery on the respiratory system: Secondary | ICD-10-CM | POA: Diagnosis not present

## 2021-02-21 DIAGNOSIS — J90 Pleural effusion, not elsewhere classified: Secondary | ICD-10-CM

## 2021-02-21 DIAGNOSIS — N186 End stage renal disease: Secondary | ICD-10-CM | POA: Diagnosis not present

## 2021-02-21 DIAGNOSIS — Z95811 Presence of heart assist device: Secondary | ICD-10-CM

## 2021-02-21 HISTORY — PX: IR THORACENTESIS ASP PLEURAL SPACE W/IMG GUIDE: IMG5380

## 2021-02-21 LAB — GRAM STAIN

## 2021-02-21 LAB — COMPREHENSIVE METABOLIC PANEL
ALT: 16 U/L (ref 0–44)
AST: 24 U/L (ref 15–41)
Albumin: 3.2 g/dL — ABNORMAL LOW (ref 3.5–5.0)
Alkaline Phosphatase: 153 U/L — ABNORMAL HIGH (ref 38–126)
Anion gap: 11 (ref 5–15)
BUN: 26 mg/dL — ABNORMAL HIGH (ref 6–20)
CO2: 25 mmol/L (ref 22–32)
Calcium: 9.4 mg/dL (ref 8.9–10.3)
Chloride: 102 mmol/L (ref 98–111)
Creatinine, Ser: 2.86 mg/dL — ABNORMAL HIGH (ref 0.61–1.24)
GFR, Estimated: 24 mL/min — ABNORMAL LOW (ref >60)
Glucose, Bld: 161 mg/dL — ABNORMAL HIGH (ref 70–99)
Potassium: 3.6 mmol/L (ref 3.5–5.1)
Sodium: 138 mmol/L (ref 135–145)
Total Bilirubin: 1.2 mg/dL (ref 0.3–1.2)
Total Protein: 8.2 g/dL — ABNORMAL HIGH (ref 6.5–8.1)

## 2021-02-21 LAB — LACTATE DEHYDROGENASE, PLEURAL OR PERITONEAL FLUID: LD, Fluid: 90 U/L — ABNORMAL HIGH (ref 3–23)

## 2021-02-21 LAB — PROTIME-INR
INR: 2.5 — ABNORMAL HIGH (ref 0.8–1.2)
Prothrombin Time: 27.2 seconds — ABNORMAL HIGH (ref 11.4–15.2)

## 2021-02-21 LAB — LACTATE DEHYDROGENASE: LDH: 164 U/L (ref 98–192)

## 2021-02-21 LAB — PROTEIN, PLEURAL OR PERITONEAL FLUID: Total protein, fluid: 4 g/dL

## 2021-02-21 MED ORDER — LIDOCAINE HCL 1 % IJ SOLN
INTRAMUSCULAR | Status: AC
  Filled 2021-02-21: qty 20

## 2021-02-21 MED ORDER — LIDOCAINE HCL 1 % IJ SOLN
INTRAMUSCULAR | Status: DC | PRN
  Administered 2021-02-21: 10 mL

## 2021-02-21 NOTE — Addendum Note (Signed)
Addended by: Tanda Rockers B on: 02/21/2021 09:48 AM   Modules accepted: Orders

## 2021-02-21 NOTE — Progress Notes (Signed)
VAD Coordinator met patient in IR procedure room during thoracentesis. Patient tolerated procedure well with no VAD alarms noted. CXR performed post procedure. Labs drawn in [place] clinic post procedure.   Dr. Aundra Dubin updated re: 1.6 liters drained from left pleural effusion.   Per Dr. Aundra Dubin, will re-schedule patient's VAD clinic appointment from this Thursday as requested by patient.   Informed patient to call VAD pager if SOB or cough continues or worsens or if he experiences any other symptoms. Patient and brother [name]) verbalized understanding of same.   Zada Girt RN, VAD Coordinator 24/7 VAD Pager: [phone]

## 2021-02-21 NOTE — Procedures (Addendum)
PROCEDURE SUMMARY:  Successful image-guided left thoracentesis. Yielded 1.6 liters of clear, dark yellow fluid. Patient tolerated procedure well. EBL < 1 mL No immediate complications.  Specimen was sent for labs. Post procedure CXR shows no pneumothorax.  Please see imaging section of Epic for full dictation.  Joaquim Nam PA-C 02/21/2021 10:01 AM

## 2021-02-21 NOTE — Progress Notes (Signed)
LVAD INR 

## 2021-02-22 DIAGNOSIS — Z992 Dependence on renal dialysis: Secondary | ICD-10-CM | POA: Diagnosis not present

## 2021-02-22 DIAGNOSIS — N186 End stage renal disease: Secondary | ICD-10-CM | POA: Diagnosis not present

## 2021-02-22 DIAGNOSIS — N2581 Secondary hyperparathyroidism of renal origin: Secondary | ICD-10-CM | POA: Diagnosis not present

## 2021-02-22 LAB — ACID FAST SMEAR (AFB, MYCOBACTERIA): Acid Fast Smear: NEGATIVE

## 2021-02-23 ENCOUNTER — Encounter (HOSPITAL_COMMUNITY): Payer: Medicare HMO

## 2021-02-24 ENCOUNTER — Other Ambulatory Visit (HOSPITAL_COMMUNITY): Payer: Self-pay | Admitting: Unknown Physician Specialty

## 2021-02-24 DIAGNOSIS — Z992 Dependence on renal dialysis: Secondary | ICD-10-CM | POA: Diagnosis not present

## 2021-02-24 DIAGNOSIS — Z7901 Long term (current) use of anticoagulants: Secondary | ICD-10-CM

## 2021-02-24 DIAGNOSIS — N186 End stage renal disease: Secondary | ICD-10-CM | POA: Diagnosis not present

## 2021-02-24 DIAGNOSIS — N2581 Secondary hyperparathyroidism of renal origin: Secondary | ICD-10-CM | POA: Diagnosis not present

## 2021-02-24 DIAGNOSIS — Z95811 Presence of heart assist device: Secondary | ICD-10-CM

## 2021-02-27 DIAGNOSIS — Z992 Dependence on renal dialysis: Secondary | ICD-10-CM | POA: Diagnosis not present

## 2021-02-27 DIAGNOSIS — N186 End stage renal disease: Secondary | ICD-10-CM | POA: Diagnosis not present

## 2021-02-27 DIAGNOSIS — N2581 Secondary hyperparathyroidism of renal origin: Secondary | ICD-10-CM | POA: Diagnosis not present

## 2021-02-28 ENCOUNTER — Ambulatory Visit (HOSPITAL_COMMUNITY)
Admission: RE | Admit: 2021-02-28 | Discharge: 2021-02-28 | Disposition: A | Discharge status: Home / Self Care | Payer: Medicare HMO | Source: Ambulatory Visit | Attending: Cardiology | Admitting: Cardiology

## 2021-02-28 ENCOUNTER — Other Ambulatory Visit (HOSPITAL_COMMUNITY): Payer: Self-pay | Admitting: *Deleted

## 2021-02-28 ENCOUNTER — Other Ambulatory Visit: Payer: Self-pay

## 2021-02-28 ENCOUNTER — Ambulatory Visit (HOSPITAL_COMMUNITY): Payer: Self-pay

## 2021-02-28 ENCOUNTER — Telehealth (HOSPITAL_COMMUNITY): Payer: Self-pay | Admitting: *Deleted

## 2021-02-28 DIAGNOSIS — I517 Cardiomegaly: Secondary | ICD-10-CM | POA: Diagnosis not present

## 2021-02-28 DIAGNOSIS — Z7901 Long term (current) use of anticoagulants: Secondary | ICD-10-CM | POA: Diagnosis not present

## 2021-02-28 DIAGNOSIS — R0602 Shortness of breath: Secondary | ICD-10-CM | POA: Diagnosis not present

## 2021-02-28 DIAGNOSIS — J90 Pleural effusion, not elsewhere classified: Secondary | ICD-10-CM | POA: Insufficient documentation

## 2021-02-28 DIAGNOSIS — Z95811 Presence of heart assist device: Secondary | ICD-10-CM

## 2021-02-28 LAB — PROTIME-INR
INR: 2.7 — ABNORMAL HIGH (ref 0.8–1.2)
Prothrombin Time: 28.4 seconds — ABNORMAL HIGH (ref 11.4–15.2)

## 2021-02-28 NOTE — Telephone Encounter (Signed)
Patient presented in [place] clinic this am for lab (INR) with his brother.  Pt reports his cough has improved since left pleural effusion last week, but he "can't sleep at night". Says he gets SOB when lying on left side; he has to sit up to catch his breath. CXR obtained per Dr. Claris Gladden order.  CXR results reviewed by Dr. Aundra Dubin. Left pleural thoracentesis with cell count and culture ordered and scheduled for Thursday, 03/02/21 at 9:00 am.   Called patient to inform him of above, asked that he arrive to Radiology at 8:45 am for 9:00 am procedure. Pt verbalized understanding and agreement to same.  Zada Girt RN, VAD Coordinator 24/7 VAD Pager: [phone]

## 2021-02-28 NOTE — Progress Notes (Signed)
LVAD INR 

## 2021-03-01 DIAGNOSIS — Z992 Dependence on renal dialysis: Secondary | ICD-10-CM | POA: Diagnosis not present

## 2021-03-01 DIAGNOSIS — N186 End stage renal disease: Secondary | ICD-10-CM | POA: Diagnosis not present

## 2021-03-01 DIAGNOSIS — N2581 Secondary hyperparathyroidism of renal origin: Secondary | ICD-10-CM | POA: Diagnosis not present

## 2021-03-02 ENCOUNTER — Other Ambulatory Visit (HOSPITAL_COMMUNITY): Payer: Self-pay | Admitting: Student

## 2021-03-02 ENCOUNTER — Ambulatory Visit (HOSPITAL_COMMUNITY)
Admission: RE | Admit: 2021-03-02 | Discharge: 2021-03-02 | Disposition: A | Discharge status: Home / Self Care | Payer: Medicare HMO | Source: Ambulatory Visit | Attending: Cardiology | Admitting: Cardiology

## 2021-03-02 ENCOUNTER — Other Ambulatory Visit (HOSPITAL_COMMUNITY): Payer: Self-pay | Admitting: Cardiology

## 2021-03-02 ENCOUNTER — Other Ambulatory Visit: Payer: Self-pay

## 2021-03-02 ENCOUNTER — Ambulatory Visit (HOSPITAL_COMMUNITY)
Admission: RE | Admit: 2021-03-02 | Discharge: 2021-03-02 | Disposition: A | Discharge status: Home / Self Care | Payer: Medicare HMO | Source: Ambulatory Visit | Attending: Student | Admitting: Student

## 2021-03-02 ENCOUNTER — Ambulatory Visit (HOSPITAL_COMMUNITY): Admission: RE | Admit: 2021-03-02 | Payer: Medicare HMO | Source: Ambulatory Visit

## 2021-03-02 DIAGNOSIS — J90 Pleural effusion, not elsewhere classified: Secondary | ICD-10-CM | POA: Diagnosis not present

## 2021-03-02 DIAGNOSIS — I509 Heart failure, unspecified: Secondary | ICD-10-CM

## 2021-03-02 DIAGNOSIS — Z95811 Presence of heart assist device: Secondary | ICD-10-CM

## 2021-03-02 HISTORY — PX: IR THORACENTESIS ASP PLEURAL SPACE W/IMG GUIDE: IMG5380

## 2021-03-02 LAB — BODY FLUID CELL COUNT WITH DIFFERENTIAL
Eos, Fluid: 0 %
Lymphs, Fluid: 86 %
Monocyte-Macrophage-Serous Fluid: 10 % — ABNORMAL LOW (ref 50–90)
Neutrophil Count, Fluid: 2 % (ref 0–25)
Total Nucleated Cell Count, Fluid: 79 cu mm (ref 0–1000)

## 2021-03-02 LAB — GRAM STAIN

## 2021-03-02 MED ORDER — LIDOCAINE HCL (PF) 1 % IJ SOLN
INTRAMUSCULAR | Status: DC | PRN
  Administered 2021-03-02: 10 mL

## 2021-03-02 MED ORDER — LIDOCAINE HCL 1 % IJ SOLN
INTRAMUSCULAR | Status: AC
  Filled 2021-03-02: qty 20

## 2021-03-02 NOTE — Progress Notes (Signed)
VAD Coordinator met patient in IR for thoracentesis of left pleural effusion.   Pt tolerated procedure with no VAD alarms.   PA removed 1.7 L from left pleural effusion.   Dr. Aundra Dubin and [facility] updated.   Messaged Dr. Carlus Pavlov at [facility] per Dr. Aundra Dubin asking if he can decrease patient's dry weight again.   Zada Girt RN, [place] Coordinator [phone]

## 2021-03-02 NOTE — Procedures (Signed)
PROCEDURE SUMMARY:  Successful image-guided left thoracentesis. Yielded 1.7L of clear amber fluid. Pt tolerated procedure well. No immediate complications. EBL = trace   Specimen was  sent for labs. CXR ordered.  Please see imaging section of Epic for full dictation.  Armando Gang Albertus Chiarelli PA-C 03/02/2021 9:46 AM

## 2021-03-03 ENCOUNTER — Other Ambulatory Visit (HOSPITAL_COMMUNITY): Payer: Self-pay | Admitting: Unknown Physician Specialty

## 2021-03-03 DIAGNOSIS — Z992 Dependence on renal dialysis: Secondary | ICD-10-CM | POA: Diagnosis not present

## 2021-03-03 DIAGNOSIS — Z95811 Presence of heart assist device: Secondary | ICD-10-CM

## 2021-03-03 DIAGNOSIS — Z7901 Long term (current) use of anticoagulants: Secondary | ICD-10-CM

## 2021-03-03 DIAGNOSIS — N2581 Secondary hyperparathyroidism of renal origin: Secondary | ICD-10-CM | POA: Diagnosis not present

## 2021-03-03 DIAGNOSIS — N186 End stage renal disease: Secondary | ICD-10-CM | POA: Diagnosis not present

## 2021-03-03 LAB — PATHOLOGIST SMEAR REVIEW

## 2021-03-05 DIAGNOSIS — N186 End stage renal disease: Secondary | ICD-10-CM | POA: Diagnosis not present

## 2021-03-05 DIAGNOSIS — Z992 Dependence on renal dialysis: Secondary | ICD-10-CM | POA: Diagnosis not present

## 2021-03-05 DIAGNOSIS — I509 Heart failure, unspecified: Secondary | ICD-10-CM | POA: Diagnosis not present

## 2021-03-06 DIAGNOSIS — Z992 Dependence on renal dialysis: Secondary | ICD-10-CM | POA: Diagnosis not present

## 2021-03-06 DIAGNOSIS — N2581 Secondary hyperparathyroidism of renal origin: Secondary | ICD-10-CM | POA: Diagnosis not present

## 2021-03-06 DIAGNOSIS — N186 End stage renal disease: Secondary | ICD-10-CM | POA: Diagnosis not present

## 2021-03-07 ENCOUNTER — Other Ambulatory Visit: Payer: Self-pay

## 2021-03-07 ENCOUNTER — Ambulatory Visit (HOSPITAL_COMMUNITY): Payer: Self-pay | Admitting: Pharmacist

## 2021-03-07 ENCOUNTER — Ambulatory Visit (HOSPITAL_COMMUNITY)
Admission: RE | Admit: 2021-03-07 | Discharge: 2021-03-07 | Disposition: A | Discharge status: Home / Self Care | Payer: Medicare HMO | Source: Ambulatory Visit | Attending: Internal Medicine | Admitting: Internal Medicine

## 2021-03-07 DIAGNOSIS — Z48812 Encounter for surgical aftercare following surgery on the circulatory system: Secondary | ICD-10-CM | POA: Diagnosis not present

## 2021-03-07 DIAGNOSIS — Z95811 Presence of heart assist device: Secondary | ICD-10-CM | POA: Diagnosis not present

## 2021-03-07 DIAGNOSIS — Z7901 Long term (current) use of anticoagulants: Secondary | ICD-10-CM | POA: Insufficient documentation

## 2021-03-07 DIAGNOSIS — Z5181 Encounter for therapeutic drug level monitoring: Secondary | ICD-10-CM | POA: Insufficient documentation

## 2021-03-07 LAB — PROTIME-INR
INR: 1.9 — ABNORMAL HIGH (ref 0.8–1.2)
Prothrombin Time: 21.5 seconds — ABNORMAL HIGH (ref 11.4–15.2)

## 2021-03-07 LAB — CULTURE, BODY FLUID W GRAM STAIN -BOTTLE: Culture: NO GROWTH

## 2021-03-07 NOTE — Progress Notes (Signed)
LVAD INR 

## 2021-03-08 DIAGNOSIS — N186 End stage renal disease: Secondary | ICD-10-CM | POA: Diagnosis not present

## 2021-03-08 DIAGNOSIS — Z992 Dependence on renal dialysis: Secondary | ICD-10-CM | POA: Diagnosis not present

## 2021-03-08 DIAGNOSIS — N2581 Secondary hyperparathyroidism of renal origin: Secondary | ICD-10-CM | POA: Diagnosis not present

## 2021-03-10 DIAGNOSIS — Z992 Dependence on renal dialysis: Secondary | ICD-10-CM | POA: Diagnosis not present

## 2021-03-10 DIAGNOSIS — N2581 Secondary hyperparathyroidism of renal origin: Secondary | ICD-10-CM | POA: Diagnosis not present

## 2021-03-10 DIAGNOSIS — N186 End stage renal disease: Secondary | ICD-10-CM | POA: Diagnosis not present

## 2021-03-11 DIAGNOSIS — R5381 Other malaise: Secondary | ICD-10-CM | POA: Diagnosis not present

## 2021-03-13 DIAGNOSIS — N2581 Secondary hyperparathyroidism of renal origin: Secondary | ICD-10-CM | POA: Diagnosis not present

## 2021-03-13 DIAGNOSIS — Z992 Dependence on renal dialysis: Secondary | ICD-10-CM | POA: Diagnosis not present

## 2021-03-13 DIAGNOSIS — N186 End stage renal disease: Secondary | ICD-10-CM | POA: Diagnosis not present

## 2021-03-15 DIAGNOSIS — N186 End stage renal disease: Secondary | ICD-10-CM | POA: Diagnosis not present

## 2021-03-15 DIAGNOSIS — Z992 Dependence on renal dialysis: Secondary | ICD-10-CM | POA: Diagnosis not present

## 2021-03-15 DIAGNOSIS — N2581 Secondary hyperparathyroidism of renal origin: Secondary | ICD-10-CM | POA: Diagnosis not present

## 2021-03-16 ENCOUNTER — Ambulatory Visit (HOSPITAL_COMMUNITY): Payer: Self-pay | Admitting: Pharmacist

## 2021-03-16 ENCOUNTER — Other Ambulatory Visit (HOSPITAL_COMMUNITY): Payer: Self-pay | Admitting: *Deleted

## 2021-03-16 DIAGNOSIS — Z7901 Long term (current) use of anticoagulants: Secondary | ICD-10-CM | POA: Diagnosis not present

## 2021-03-16 DIAGNOSIS — Z95811 Presence of heart assist device: Secondary | ICD-10-CM

## 2021-03-16 LAB — POCT INR: INR: 2.6 (ref 2.0–3.0)

## 2021-03-16 NOTE — Progress Notes (Signed)
LVAD INR 

## 2021-03-17 DIAGNOSIS — N186 End stage renal disease: Secondary | ICD-10-CM | POA: Diagnosis not present

## 2021-03-17 DIAGNOSIS — N2581 Secondary hyperparathyroidism of renal origin: Secondary | ICD-10-CM | POA: Diagnosis not present

## 2021-03-17 DIAGNOSIS — Z992 Dependence on renal dialysis: Secondary | ICD-10-CM | POA: Diagnosis not present

## 2021-03-20 DIAGNOSIS — N186 End stage renal disease: Secondary | ICD-10-CM | POA: Diagnosis not present

## 2021-03-20 DIAGNOSIS — Z992 Dependence on renal dialysis: Secondary | ICD-10-CM | POA: Diagnosis not present

## 2021-03-20 DIAGNOSIS — N2581 Secondary hyperparathyroidism of renal origin: Secondary | ICD-10-CM | POA: Diagnosis not present

## 2021-03-21 ENCOUNTER — Other Ambulatory Visit (HOSPITAL_COMMUNITY): Payer: Medicare HMO

## 2021-03-21 ENCOUNTER — Ambulatory Visit (HOSPITAL_COMMUNITY): Payer: Self-pay | Admitting: Pharmacist

## 2021-03-21 LAB — POCT INR: INR: 4.3 — AB (ref 2.0–3.0)

## 2021-03-21 NOTE — Progress Notes (Signed)
LVAD INR 

## 2021-03-22 DIAGNOSIS — Z992 Dependence on renal dialysis: Secondary | ICD-10-CM | POA: Diagnosis not present

## 2021-03-22 DIAGNOSIS — N186 End stage renal disease: Secondary | ICD-10-CM | POA: Diagnosis not present

## 2021-03-22 DIAGNOSIS — N2581 Secondary hyperparathyroidism of renal origin: Secondary | ICD-10-CM | POA: Diagnosis not present

## 2021-03-23 ENCOUNTER — Telehealth (HOSPITAL_COMMUNITY): Payer: Self-pay | Admitting: *Deleted

## 2021-03-23 ENCOUNTER — Other Ambulatory Visit (HOSPITAL_COMMUNITY): Payer: Self-pay | Admitting: Cardiology

## 2021-03-23 DIAGNOSIS — Z95811 Presence of heart assist device: Secondary | ICD-10-CM

## 2021-03-23 DIAGNOSIS — R52 Pain, unspecified: Secondary | ICD-10-CM

## 2021-03-23 MED ORDER — OXYCODONE HCL 5 MG PO TABS: 5.0000 mg | ORAL_TABLET | Freq: Four times a day (QID) | ORAL | 0 refills | Status: DC | PRN

## 2021-03-23 MED ORDER — TRAMADOL HCL 100 MG PO TABS: 100.0000 mg | ORAL_TABLET | Freq: Two times a day (BID) | ORAL | 2 refills | Status: DC | PRN

## 2021-03-23 NOTE — Telephone Encounter (Signed)
Pt called requesting refill on Oxy for hand pain. He says he is not taking daily, but as needed.   He is taking gabapentin, but does not help with hand pain.  He has not tried tramadol, thinks it "made me sick one time".   Per Dr. Aundra Dubin, asked patient to try tramadol (do not take on empty stomach). Will send Rx for oxy for more severe pain.  Zada Girt RN, [place] Coordinator [phone]

## 2021-03-24 DIAGNOSIS — Z992 Dependence on renal dialysis: Secondary | ICD-10-CM | POA: Diagnosis not present

## 2021-03-24 DIAGNOSIS — N186 End stage renal disease: Secondary | ICD-10-CM | POA: Diagnosis not present

## 2021-03-24 DIAGNOSIS — N2581 Secondary hyperparathyroidism of renal origin: Secondary | ICD-10-CM | POA: Diagnosis not present

## 2021-03-27 ENCOUNTER — Other Ambulatory Visit (HOSPITAL_COMMUNITY): Payer: Self-pay | Admitting: Cardiology

## 2021-03-27 DIAGNOSIS — Z992 Dependence on renal dialysis: Secondary | ICD-10-CM | POA: Diagnosis not present

## 2021-03-27 DIAGNOSIS — N186 End stage renal disease: Secondary | ICD-10-CM | POA: Diagnosis not present

## 2021-03-27 DIAGNOSIS — N2581 Secondary hyperparathyroidism of renal origin: Secondary | ICD-10-CM | POA: Diagnosis not present

## 2021-03-28 ENCOUNTER — Other Ambulatory Visit: Payer: Self-pay

## 2021-03-28 ENCOUNTER — Ambulatory Visit (HOSPITAL_COMMUNITY)
Admission: RE | Admit: 2021-03-28 | Discharge: 2021-03-28 | Disposition: A | Discharge status: Home / Self Care | Payer: Medicare HMO | Source: Ambulatory Visit | Attending: Internal Medicine | Admitting: Internal Medicine

## 2021-03-28 ENCOUNTER — Ambulatory Visit (HOSPITAL_COMMUNITY): Payer: Self-pay | Admitting: Pharmacist

## 2021-03-28 VITALS — BP 114/0 | HR 92 | Ht 70.0 in | Wt 173.2 lb

## 2021-03-28 DIAGNOSIS — I2510 Atherosclerotic heart disease of native coronary artery without angina pectoris: Secondary | ICD-10-CM | POA: Diagnosis not present

## 2021-03-28 DIAGNOSIS — I951 Orthostatic hypotension: Secondary | ICD-10-CM | POA: Insufficient documentation

## 2021-03-28 DIAGNOSIS — Z7901 Long term (current) use of anticoagulants: Secondary | ICD-10-CM | POA: Diagnosis not present

## 2021-03-28 DIAGNOSIS — E1122 Type 2 diabetes mellitus with diabetic chronic kidney disease: Secondary | ICD-10-CM | POA: Diagnosis not present

## 2021-03-28 DIAGNOSIS — E1142 Type 2 diabetes mellitus with diabetic polyneuropathy: Secondary | ICD-10-CM | POA: Insufficient documentation

## 2021-03-28 DIAGNOSIS — Z8616 Personal history of COVID-19: Secondary | ICD-10-CM | POA: Insufficient documentation

## 2021-03-28 DIAGNOSIS — N186 End stage renal disease: Secondary | ICD-10-CM | POA: Diagnosis not present

## 2021-03-28 DIAGNOSIS — I4891 Unspecified atrial fibrillation: Secondary | ICD-10-CM | POA: Diagnosis not present

## 2021-03-28 DIAGNOSIS — D649 Anemia, unspecified: Secondary | ICD-10-CM | POA: Insufficient documentation

## 2021-03-28 DIAGNOSIS — I132 Hypertensive heart and chronic kidney disease with heart failure and with stage 5 chronic kidney disease, or end stage renal disease: Secondary | ICD-10-CM | POA: Insufficient documentation

## 2021-03-28 DIAGNOSIS — Z951 Presence of aortocoronary bypass graft: Secondary | ICD-10-CM | POA: Insufficient documentation

## 2021-03-28 DIAGNOSIS — Z79899 Other long term (current) drug therapy: Secondary | ICD-10-CM | POA: Insufficient documentation

## 2021-03-28 DIAGNOSIS — E11319 Type 2 diabetes mellitus with unspecified diabetic retinopathy without macular edema: Secondary | ICD-10-CM | POA: Diagnosis not present

## 2021-03-28 DIAGNOSIS — Z992 Dependence on renal dialysis: Secondary | ICD-10-CM | POA: Diagnosis not present

## 2021-03-28 DIAGNOSIS — I5022 Chronic systolic (congestive) heart failure: Secondary | ICD-10-CM | POA: Insufficient documentation

## 2021-03-28 DIAGNOSIS — Z95811 Presence of heart assist device: Secondary | ICD-10-CM | POA: Insufficient documentation

## 2021-03-28 DIAGNOSIS — E1136 Type 2 diabetes mellitus with diabetic cataract: Secondary | ICD-10-CM | POA: Diagnosis not present

## 2021-03-28 DIAGNOSIS — I255 Ischemic cardiomyopathy: Secondary | ICD-10-CM | POA: Diagnosis not present

## 2021-03-28 DIAGNOSIS — K3184 Gastroparesis: Secondary | ICD-10-CM | POA: Insufficient documentation

## 2021-03-28 DIAGNOSIS — Z794 Long term (current) use of insulin: Secondary | ICD-10-CM | POA: Diagnosis not present

## 2021-03-28 DIAGNOSIS — Z4801 Encounter for change or removal of surgical wound dressing: Secondary | ICD-10-CM | POA: Insufficient documentation

## 2021-03-28 DIAGNOSIS — I4892 Unspecified atrial flutter: Secondary | ICD-10-CM | POA: Insufficient documentation

## 2021-03-28 LAB — PROTIME-INR
INR: 1.6 — ABNORMAL HIGH (ref 0.8–1.2)
Prothrombin Time: 18.6 seconds — ABNORMAL HIGH (ref 11.4–15.2)

## 2021-03-28 MED ORDER — MIDODRINE HCL 10 MG PO TABS: ORAL_TABLET | ORAL | 3 refills | Status: DC

## 2021-03-28 NOTE — Progress Notes (Signed)
LVAD INR 

## 2021-03-28 NOTE — Progress Notes (Addendum)
Patient presents for 1 mo f/u in [facility] today with brother [name]). Reports no problems with VAD equipment or concerns with drive line.  Patient arrived in w/c but walked to scales.   Patient reports he continues M/W/F dialysis - his dry weight was dropped as we requested after his last visit. Pt denies any dizziness or lightheadness today - weight is 173.2 lbs  Pt has had 2 thoracentesis since his last visit 02/21/21 and 03/02/21. Pt had over a liter of fluid removed each time. Pt states that since having these procedures his cough has completely resolved.  Patient reports he is still taking Oxy occasionally for right hand pain. Pt states that he is doing his hand exercises that were ordered by PT for him.  Dr. Aundra Dubin would like for the pt to start CR. They have reached out in the past but pt did not return their phone call. I will reach out to them today to see if they can see him. Pt is only able to do tues/thur due to diaylysis. He was able to walk 1020 ft today in clinic during his 6MW.  Pt checked his INR on his home machine in clinic today to compare to venipuncture.  Home: 1.5 Hospital: 1.6  Vital Signs:  Doppler Pressure: 92 Automatc BP: 119/80 (105)  HR:  92 SPO2: UTO   Weight: 173.2 lb w/ eqt Last wt: 180.2lb    VAD Indication: Destination Therapy due to CKD Stage 3b   LVAD assessment:  HM III:: VAD Speed: 5200 rpms Flow:  4.1 Power: 3.8 w    PI:  5.4 Alarms: none Events: 0 - 5  Fixed speed:  5200 Low speed limit:  4900  Primary controller: back up battery due for replacement in 26 months Secondary controller:  back up battery due for replacement in 26 months   I reviewed the LVAD parameters from today and compared the results to the patient's prior recorded data. LVAD interrogation was NEGATIVE for significant power changes, NEGATIVE for clinical alarms and STABLE for PI events/speed drops. No programming changes were made and pump is functioning within  specified parameters. Pt is performing daily controller and system monitor self tests along with completing weekly and monthly maintenance for LVAD equipment.   LVAD equipment check completed and is in good working order. Back-up equipment present.    Annual Equipment Maintenance on UBC/PM was performed 3/22.  Exit Site Care: VAD dressing and anchor removed and site care performed using sterile technique. Drive line exit site cleaned with Chlora prep applicators x 2, allowed to dry before Sorbaview dressing with silverlon patch applied. Exit site incorporated. The velour is fully implanted at exit site. No redness or tenderness noted. Drive line anchor re-applied. Pt denies fever or chills. Driveline dressing is being changed once weekly per sterile technique by son [name]).  Provided with 8 weekly dressing kits. If site looks great next visit may release pt to shower.  Device: Medtronic single Therapies: on 150 bpm Pacing: VVI 40 Last check:  01/10/21   BP & Labs:  Doppler 114 - Doppler is reflecting MAP   Hgb 11.4  - No S/S of bleeding. Specifically denies melena/BRBPR or nosebleeds.   LDH  178 - established baseline of 160 - 180. Denies tea-colored urine. No power elevations noted on interrogation.    Patient Instructions: STOP Midodrine on NON dialysis days. DECREASE Midodrine to half tablet (5mg ) three times a day on Diaylsis days We will reach out to Cardiac rehab  so that you can begin this soon Return to clinic in 1 mo    Tanda Rockers BSN, RN VAD Coordinator  Office: [phone] 24/7 Emergency VAD Pager: [phone]   Follow up for Heart Failure/LVAD:  Valeriano Bain. is a 60 y.o. male who has a h/o DM2, CAD s/p CABG 3875, systolic HF due to ischemic cardiomyopathy with EF 20-25% (echo 12/15), DM2 and CKD. He is s/p Medtronic single chamber ICD.   Admitted in 12/15 due to ADHF. Required short course milrinone for diuresis. Diuresed 30 pounds.    CPX 2/16 showed  severely reduced functional capacity.  There was a significant disconnect between his symptoms and the CPX.  RHC in 6/16 showed fairly normal filling pressures and low but not markedly low cardiac output.    CPX (4/19) was submaximal but suggested severe limitation from HF.    He was admitted in 6/19 with marked volume overload and dyspnea.  Echo in 6/19 showed EF 20% with severe global hypokinesis.  LHC/RHC showed severe native vessel CAD with patent LIMA-D, SVG-OM, and SVG-PLV; cardiac index low.  He was started on milrinone and diuresed.  We discussed LVAD extensively in light of low output, creatinine that is trending up and concern for decompensation of RV over time. He was adamant that he did not want to pursue LVAD workup yet and did not want a referral for transplant evaluation. Milrinone was weaned off and he was discharged home.    Admitted 08/01/18-08/07/18 with volume overload and left foot wound. Diuresed 38 lbs with IV lasix and then transitioned back to torsemide 100 mg BID. ID was consulted with concerns for left foot osteomyelitis noted on CT scan. He got IV ancef q8 hours x 42 days. [facility] provided teaching and supplies for home antibiotics. Course complicated by AKI. DC weight 200 lbs.    He went into atrial flutter and had DCCV to NSR in 1/20.    Echo in 4/21 showed EF 25% with apical akinesis and diffuse hypokinesis relatively preserved in lateral wall, mildly decreased RV systolic function, PASP 41 mmHg. CPX in 5/21 showed severe functional limitation suggestive of advanced HF. 5/21 Zio patch showed short atrial flutter runs, 3.8% PVCs.    He developed COVID-19 infection in 1/22.  Symptoms were predominantly GI.    He developed progressive worsening of CHF in early 2022 and was admitted in 3/22 with low output HF.  Impella 5.5 was placed as bridge to Heartmate III LVAD placed in 10/13/20.  He developed post-op renal failure and ended up on dialysis.  He was markedly deconditioned  post-op and went to CIR.  Now he is at home.   At last appointment, he was short of breath and coughing.  He appeared volume overloaded.  We contacted his dialysis center, and dry weight was lowered.  He had a left pleural effusion and left thoracentesis was done. Breathing better and cough resolved.  He is still short of breath walking longer distances and walking up stairs.  Does ok around the house and for short distances.  Still has pain in right hand. MAP 105 today.   I reviewed the LVAD parameters from today, and compared the results to the patient's prior recorded data.  No programming changes were made.  The LVAD is functioning within specified parameters.  The patient performs LVAD self-test daily.  LVAD interrogation was negative for any significant power changes, alarms or PI events/speed drops.  LVAD equipment check completed and is in good working  order.  Back-up equipment present.   LVAD education done on emergency procedures and precautions and reviewed exit site care.  Labs (5/22): LDH 174, hgb 10.6 Labs (7/22): LDH 164 Labs (8/22): hgb 11.4  LVAD Interrogation HM III: See LVAD coordinator's note above   Past Medical History:  Diagnosis Date   AICD (automatic cardioverter/defibrillator) present    Single-chamber  implantable cardiac defibrillator - Medtronic   Atrial fibrillation ([place])    Cataract    Mixed form OU   CHF (congestive heart failure) ([place])    Chronic kidney disease    Chronic kidney disease (CKD), stage III (moderate) (HCC)    Chronic systolic heart failure ([place])    a. Echo 5/13: Mild LVE, mild LVH, EF 10%, anteroseptal, lateral, apical AK, mild MR, mild LAE, moderately reduced RVSF, mild RAE, PASP 60;  b. 07/2014 Echo: EF 20-2%, diff HK, AKI of antsep/apical/mid-apicalinferior, mod reduced RV.   Coronary artery disease    a. s/p CABG 2002 b. LHC 5/13:  dLM 80%, LAD subtotally occluded, pCFX occluded, pRCA 50%, mid? Occlusion with high take off of the PDA with  70% multiple lesions-not bypassed and supplies collaterals to LAD, LIMA-IM/ramus ok, S-OM ok, S-PLA branches ok. Medical therapy was recommended   COVID    Diabetic retinopathy ([place])    NPDR OD, PDR OS   Dyspnea    Gout    "on daily RX" (01/08/2018)   Hypertension    Hypertensive retinopathy    OU   Ischemic cardiomyopathy    a. Echo 5/13: Mild LVE, mild LVH, EF 10%, anteroseptal, lateral, apical AK, mild MR, mild LAE, moderately reduced RVSF, mild RAE, PASP 60;  b. 01/2012 s/p MDT [id number] Protecta XT VR AICD;  c. 07/2014 Echo: EF 20-2%, diff HK, AKI of antsep/apical/mid-apicalinferior, mod reduced RV.   MRSA (methicillin resistant Staphylococcus aureus)    Status post right foot plantar deep infection with MRSA status post  I&D 10/2008   Myocardial infarction [facility])    "was told I'd had several before heart OR in 2002" (01/08/2018)   Noncompliance    Peripheral neuropathy    Retinopathy, diabetic, background ([place])    Syncope    Type II diabetes mellitus ([place])    requiring insulin    Vitreous hemorrhage, left (HCC)    and proliferative diabetic retinopathy    Current Outpatient Medications  Medication Sig Dispense Refill   ascorbic acid (VITAMIN C) 1000 MG tablet Take 1 tablet (1,000 mg total) by mouth daily. 90 tablet 3   gabapentin (NEURONTIN) 100 MG capsule Take one tablet in am and 2 tablets in evening 90 capsule 11   insulin glargine (LANTUS) 100 UNIT/ML injection Inject 0.08 mLs (8 Units total) into the skin daily. 10 mL 11   metoCLOPramide (REGLAN) 10 MG tablet Take 1 tablet (10 mg total) by mouth 3 (three) times daily before meals. 90 tablet 11   multivitamin (RENA-VIT) TABS tablet Take 1 tablet by mouth at bedtime. 90 tablet 3   oxyCODONE (OXY IR/ROXICODONE) 5 MG immediate release tablet Take 1 tablet (5 mg total) by mouth every 6 (six) hours as needed for severe pain. 30 tablet 0   pantoprazole (PROTONIX) 40 MG tablet Take 1 tablet (40 mg total) by mouth daily. 90 tablet 3    polyethylene glycol powder (GLYCOLAX/MIRALAX) 17 GM/SCOOP powder Dissolve 1 capful in water and drink 2 (two) times daily. 510 g 0   rosuvastatin (CRESTOR) 10 MG tablet Take 1 tablet (10 mg total) by mouth  daily. 30 tablet 11   traMADol HCl 100 MG TABS Take 100 mg by mouth 2 (two) times daily as needed. 30 tablet 2   warfarin (COUMADIN) 3 MG tablet Take 3 mg daily or as directed 120 tablet 3   acetaminophen (TYLENOL) 325 MG tablet Take 1-2 tablets (325-650 mg total) by mouth every 4 (four) hours as needed for mild pain. (Patient not taking: No sig reported)     benzonatate (TESSALON PERLES) 100 MG capsule Take 1 capsule (100 mg total) by mouth 3 (three) times daily as needed for cough. (Patient not taking: Reported on 03/28/2021) 20 capsule 0   Menthol-Methyl Salicylate (MUSCLE RUB) 10-15 % CREA Apply 1 application topically 2 (two) times daily. To right shoulder--avoid IV site (Patient not taking: Reported on 03/28/2021) 85 g 0   midodrine (PROAMATINE) 10 MG tablet Take 0.5 tablet tid on dialysis days; NO midodrine on NON dialysis days 270 tablet 3   senna-docusate (SENOKOT-S) 8.6-50 MG tablet Take 2 tablets by mouth 2 (two) times daily. (Patient not taking: Reported on 03/28/2021) 120 tablet 0   No current facility-administered medications for this encounter.    Meclizine hcl and Ivabradine  REVIEW OF SYSTEMS: All systems negative except as listed in HPI, PMH and Problem list.   LVAD INTERROGATION:  Please see LVAD nurse's note above.   I reviewed the LVAD parameters from today, and compared the results to the patient's prior recorded data.  No programming changes were made.  The LVAD is functioning within specified parameters.  The patient performs LVAD self-test daily.  LVAD interrogation was negative for any significant power changes, alarms or PI events/speed drops.  LVAD equipment check completed and is in good working order.  Back-up equipment present.   LVAD education done on emergency  procedures and precautions and reviewed exit site care.   MAP 105  HR 92  wt 173  Physical Exam: General: Well appearing this am. NAD.  HEENT: Normal. Neck: Supple, JVP 10 cm. Carotids OK.  Cardiac:  Mechanical heart sounds with LVAD hum present.  Lungs:  Decreased BS left base.  Abdomen:  NT, ND, no HSM. No bruits or masses. +BS  LVAD exit site: Well-healed and incorporated. Dressing dry and intact. No erythema or drainage. Stabilization device present and accurately applied. Driveline dressing changed daily per sterile technique. Extremities:  Warm and dry. No cyanosis, clubbing, rash, or edema.  Neuro:  Alert & oriented x 3. Cranial nerves grossly intact. Moves all 4 extremities w/o difficulty. Affect pleasant    ASSESSMENT AND PLAN:  1. Chronic systolic CHF:  Ischemic cardiomyopathy. Has Medtronic ICD. Low output HF, admitted and Impella 5.5 placed 10/04/20.  HM3 LVAD was placed on 10/13/20. Developed post-op renal failure requiring eventual hemodialysis.  Volume status better and weight down after lowering dry weight at HD.  Still mild volume overload but improved. MAP still elevated.  - Stop midodrine on non-HD days and decrease to 5 mg tid on HD days.  2. LVAD:  VAD interrogated personally. Parameters stable with rare PI events.  - He is now off ASA.  - Continue warfarin with INR goal 2-2.5.   3. CAD s/p CABG 2002:  Last cath in 6/19 with patent grafts. No chest pain.  - Continue Crestor.  4. ESRD: Currently dialyzing via tunneled catheter.  - Continue HD M-W-F  - No low flows.  - With continuous flow LVAD, think AV fistula would be unlikely to mature.   5. Atrial flutter/fibrillation: s/p DC-CV  08/21/18.  NSR today.  6. Right hand: Likely neuropathic from stretch of brachial plexus with Impella placement.  - Continue gabapentin 7. DM: Per primary care.  8. Anemia: Hgb stable on recent labs.   9. Deconditioning:  I recommended that he start cardiac rehab.   10. Suspected  Gastroparesis. Nausea improved on Reglan.   Followup in 1 month.   Loralie Champagne 03/28/2021

## 2021-03-28 NOTE — Patient Instructions (Signed)
STOP Midodrine on NON dialysis days. DECREASE Midodrine to half tablet (5mg ) three times a day on Diaylsis days We will reach out to Cardiac rehab so that you can begin this soon Return to clinic in 1 mo

## 2021-03-28 NOTE — Progress Notes (Signed)
Patient did a 6 minute walk at 1050 feet and 320.0 meters.

## 2021-03-28 NOTE — Addendum Note (Signed)
Encounter addended by: Christinia Gully, RN on: 03/28/2021 2:09 PM  Actions taken: Vitals modified

## 2021-03-29 DIAGNOSIS — N186 End stage renal disease: Secondary | ICD-10-CM | POA: Diagnosis not present

## 2021-03-29 DIAGNOSIS — N2581 Secondary hyperparathyroidism of renal origin: Secondary | ICD-10-CM | POA: Diagnosis not present

## 2021-03-29 DIAGNOSIS — Z992 Dependence on renal dialysis: Secondary | ICD-10-CM | POA: Diagnosis not present

## 2021-03-31 DIAGNOSIS — Z992 Dependence on renal dialysis: Secondary | ICD-10-CM | POA: Diagnosis not present

## 2021-03-31 DIAGNOSIS — N2581 Secondary hyperparathyroidism of renal origin: Secondary | ICD-10-CM | POA: Diagnosis not present

## 2021-03-31 DIAGNOSIS — N186 End stage renal disease: Secondary | ICD-10-CM | POA: Diagnosis not present

## 2021-04-03 DIAGNOSIS — Z992 Dependence on renal dialysis: Secondary | ICD-10-CM | POA: Diagnosis not present

## 2021-04-03 DIAGNOSIS — N186 End stage renal disease: Secondary | ICD-10-CM | POA: Diagnosis not present

## 2021-04-03 DIAGNOSIS — N2581 Secondary hyperparathyroidism of renal origin: Secondary | ICD-10-CM | POA: Diagnosis not present

## 2021-04-04 ENCOUNTER — Telehealth (HOSPITAL_COMMUNITY): Payer: Self-pay

## 2021-04-04 ENCOUNTER — Encounter (HOSPITAL_COMMUNITY): Payer: Medicare HMO

## 2021-04-04 ENCOUNTER — Ambulatory Visit (HOSPITAL_COMMUNITY): Payer: Self-pay | Admitting: Pharmacist

## 2021-04-04 LAB — POCT INR: INR: 1.5 — AB (ref 2.0–3.0)

## 2021-04-04 NOTE — Telephone Encounter (Signed)
Called and spoke with pt in regards to CR, pt stated he is not interested at this time due to other health issues.   Closed referral

## 2021-04-04 NOTE — Progress Notes (Signed)
LVAD INR 

## 2021-04-05 DIAGNOSIS — N186 End stage renal disease: Secondary | ICD-10-CM | POA: Diagnosis not present

## 2021-04-05 DIAGNOSIS — N2581 Secondary hyperparathyroidism of renal origin: Secondary | ICD-10-CM | POA: Diagnosis not present

## 2021-04-05 DIAGNOSIS — I509 Heart failure, unspecified: Secondary | ICD-10-CM | POA: Diagnosis not present

## 2021-04-05 DIAGNOSIS — Z992 Dependence on renal dialysis: Secondary | ICD-10-CM | POA: Diagnosis not present

## 2021-04-06 LAB — ACID FAST CULTURE WITH REFLEXED SENSITIVITIES (MYCOBACTERIA): Acid Fast Culture: NEGATIVE

## 2021-04-07 DIAGNOSIS — Z992 Dependence on renal dialysis: Secondary | ICD-10-CM | POA: Diagnosis not present

## 2021-04-07 DIAGNOSIS — N186 End stage renal disease: Secondary | ICD-10-CM | POA: Diagnosis not present

## 2021-04-07 DIAGNOSIS — N2581 Secondary hyperparathyroidism of renal origin: Secondary | ICD-10-CM | POA: Diagnosis not present

## 2021-04-10 DIAGNOSIS — N186 End stage renal disease: Secondary | ICD-10-CM | POA: Diagnosis not present

## 2021-04-10 DIAGNOSIS — Z992 Dependence on renal dialysis: Secondary | ICD-10-CM | POA: Diagnosis not present

## 2021-04-10 DIAGNOSIS — N2581 Secondary hyperparathyroidism of renal origin: Secondary | ICD-10-CM | POA: Diagnosis not present

## 2021-04-11 ENCOUNTER — Ambulatory Visit: Payer: Medicare HMO

## 2021-04-11 ENCOUNTER — Ambulatory Visit (HOSPITAL_COMMUNITY): Payer: Self-pay | Admitting: Pharmacist

## 2021-04-11 DIAGNOSIS — R5381 Other malaise: Secondary | ICD-10-CM | POA: Diagnosis not present

## 2021-04-11 DIAGNOSIS — I255 Ischemic cardiomyopathy: Secondary | ICD-10-CM

## 2021-04-11 DIAGNOSIS — Z95811 Presence of heart assist device: Secondary | ICD-10-CM

## 2021-04-11 DIAGNOSIS — Z7901 Long term (current) use of anticoagulants: Secondary | ICD-10-CM | POA: Diagnosis not present

## 2021-04-11 DIAGNOSIS — I951 Orthostatic hypotension: Secondary | ICD-10-CM

## 2021-04-11 DIAGNOSIS — K3184 Gastroparesis: Secondary | ICD-10-CM

## 2021-04-11 LAB — CUP PACEART REMOTE DEVICE CHECK
Battery Remaining Longevity: 134 mo
Battery Voltage: 3.05 V
Brady Statistic RV Percent Paced: 0.01 %
Date Time Interrogation Session: 20220906080346
HighPow Impedance: 27 Ohm
HighPow Impedance: 35 Ohm
Implantable Lead Implant Date: 20130603
Implantable Lead Location: 753860
Implantable Lead Model: 6947
Implantable Pulse Generator Implant Date: 20211129
Lead Channel Impedance Value: 285 Ohm
Lead Channel Impedance Value: 418 Ohm
Lead Channel Pacing Threshold Amplitude: 1.125 V
Lead Channel Pacing Threshold Pulse Width: 0.4 ms
Lead Channel Sensing Intrinsic Amplitude: 4.25 mV
Lead Channel Sensing Intrinsic Amplitude: 4.25 mV
Lead Channel Setting Pacing Amplitude: 2.25 V
Lead Channel Setting Pacing Pulse Width: 0.4 ms
Lead Channel Setting Sensing Sensitivity: 0.3 mV

## 2021-04-11 LAB — POCT INR: INR: 1.3 — AB (ref 2.0–3.0)

## 2021-04-11 MED ORDER — WARFARIN SODIUM 3 MG PO TABS: ORAL_TABLET | ORAL | 3 refills | Status: DC

## 2021-04-11 MED ORDER — ENOXAPARIN SODIUM 40 MG/0.4ML IJ SOSY: 40.0000 mg | PREFILLED_SYRINGE | INTRAMUSCULAR | 0 refills | Status: DC

## 2021-04-11 NOTE — Progress Notes (Signed)
LVAD INR 

## 2021-04-12 DIAGNOSIS — N186 End stage renal disease: Secondary | ICD-10-CM | POA: Diagnosis not present

## 2021-04-12 DIAGNOSIS — Z992 Dependence on renal dialysis: Secondary | ICD-10-CM | POA: Diagnosis not present

## 2021-04-12 DIAGNOSIS — N2581 Secondary hyperparathyroidism of renal origin: Secondary | ICD-10-CM | POA: Diagnosis not present

## 2021-04-14 DIAGNOSIS — Z992 Dependence on renal dialysis: Secondary | ICD-10-CM | POA: Diagnosis not present

## 2021-04-14 DIAGNOSIS — N186 End stage renal disease: Secondary | ICD-10-CM | POA: Diagnosis not present

## 2021-04-14 DIAGNOSIS — N2581 Secondary hyperparathyroidism of renal origin: Secondary | ICD-10-CM | POA: Diagnosis not present

## 2021-04-17 ENCOUNTER — Telehealth (HOSPITAL_COMMUNITY): Payer: Self-pay | Admitting: Licensed Clinical Social Worker

## 2021-04-17 DIAGNOSIS — N186 End stage renal disease: Secondary | ICD-10-CM | POA: Diagnosis not present

## 2021-04-17 DIAGNOSIS — N2581 Secondary hyperparathyroidism of renal origin: Secondary | ICD-10-CM | POA: Diagnosis not present

## 2021-04-17 DIAGNOSIS — Z992 Dependence on renal dialysis: Secondary | ICD-10-CM | POA: Diagnosis not present

## 2021-04-17 NOTE — Telephone Encounter (Signed)
CSW contacted patient to remind of upcoming VAdiversary and to RSVP. Message left for return call. Marcus Johnson, [place], [place]

## 2021-04-18 ENCOUNTER — Ambulatory Visit (HOSPITAL_COMMUNITY): Payer: Self-pay | Admitting: Pharmacist

## 2021-04-18 LAB — POCT INR: INR: 0.8 — AB (ref 2.0–3.0)

## 2021-04-18 NOTE — Progress Notes (Signed)
LVAD INR 

## 2021-04-19 DIAGNOSIS — Z992 Dependence on renal dialysis: Secondary | ICD-10-CM | POA: Diagnosis not present

## 2021-04-19 DIAGNOSIS — N2581 Secondary hyperparathyroidism of renal origin: Secondary | ICD-10-CM | POA: Diagnosis not present

## 2021-04-19 DIAGNOSIS — N186 End stage renal disease: Secondary | ICD-10-CM | POA: Diagnosis not present

## 2021-04-20 ENCOUNTER — Ambulatory Visit (HOSPITAL_COMMUNITY)
Admission: RE | Admit: 2021-04-20 | Discharge: 2021-04-20 | Disposition: A | Discharge status: Home / Self Care | Payer: Medicare HMO | Source: Ambulatory Visit | Attending: Cardiology | Admitting: Cardiology

## 2021-04-20 ENCOUNTER — Other Ambulatory Visit: Payer: Self-pay

## 2021-04-20 ENCOUNTER — Ambulatory Visit (HOSPITAL_COMMUNITY): Payer: Self-pay | Admitting: Pharmacist

## 2021-04-20 DIAGNOSIS — Z7901 Long term (current) use of anticoagulants: Secondary | ICD-10-CM | POA: Diagnosis not present

## 2021-04-20 DIAGNOSIS — Z95811 Presence of heart assist device: Secondary | ICD-10-CM

## 2021-04-20 LAB — PROTIME-INR
INR: 1.7 — ABNORMAL HIGH (ref 0.8–1.2)
Prothrombin Time: 19.7 seconds — ABNORMAL HIGH (ref 11.4–15.2)

## 2021-04-20 NOTE — Progress Notes (Signed)
Remote ICD transmission.   

## 2021-04-20 NOTE — Progress Notes (Signed)
LVAD INR 

## 2021-04-21 DIAGNOSIS — Z992 Dependence on renal dialysis: Secondary | ICD-10-CM | POA: Diagnosis not present

## 2021-04-21 DIAGNOSIS — N2581 Secondary hyperparathyroidism of renal origin: Secondary | ICD-10-CM | POA: Diagnosis not present

## 2021-04-21 DIAGNOSIS — N186 End stage renal disease: Secondary | ICD-10-CM | POA: Diagnosis not present

## 2021-04-24 DIAGNOSIS — N2581 Secondary hyperparathyroidism of renal origin: Secondary | ICD-10-CM | POA: Diagnosis not present

## 2021-04-24 DIAGNOSIS — Z992 Dependence on renal dialysis: Secondary | ICD-10-CM | POA: Diagnosis not present

## 2021-04-24 DIAGNOSIS — N186 End stage renal disease: Secondary | ICD-10-CM | POA: Diagnosis not present

## 2021-04-25 ENCOUNTER — Ambulatory Visit (HOSPITAL_COMMUNITY): Payer: Self-pay | Admitting: Pharmacist

## 2021-04-25 ENCOUNTER — Other Ambulatory Visit (HOSPITAL_COMMUNITY): Payer: Self-pay | Admitting: *Deleted

## 2021-04-25 DIAGNOSIS — I951 Orthostatic hypotension: Secondary | ICD-10-CM

## 2021-04-25 DIAGNOSIS — K3184 Gastroparesis: Secondary | ICD-10-CM

## 2021-04-25 DIAGNOSIS — Z95811 Presence of heart assist device: Secondary | ICD-10-CM

## 2021-04-25 DIAGNOSIS — Z7901 Long term (current) use of anticoagulants: Secondary | ICD-10-CM

## 2021-04-25 LAB — POCT INR: INR: 1.7 — AB (ref 2.0–3.0)

## 2021-04-25 MED ORDER — WARFARIN SODIUM 3 MG PO TABS: ORAL_TABLET | ORAL | 3 refills | Status: DC

## 2021-04-25 NOTE — Progress Notes (Signed)
LVAD INR 

## 2021-04-26 DIAGNOSIS — Z992 Dependence on renal dialysis: Secondary | ICD-10-CM | POA: Diagnosis not present

## 2021-04-26 DIAGNOSIS — N2581 Secondary hyperparathyroidism of renal origin: Secondary | ICD-10-CM | POA: Diagnosis not present

## 2021-04-26 DIAGNOSIS — N186 End stage renal disease: Secondary | ICD-10-CM | POA: Diagnosis not present

## 2021-04-27 ENCOUNTER — Other Ambulatory Visit: Payer: Self-pay

## 2021-04-27 ENCOUNTER — Ambulatory Visit (HOSPITAL_COMMUNITY)
Admission: RE | Admit: 2021-04-27 | Discharge: 2021-04-27 | Disposition: A | Discharge status: Home / Self Care | Payer: Medicare HMO | Source: Ambulatory Visit | Attending: Cardiology | Admitting: Cardiology

## 2021-04-27 VITALS — BP 122/69 | HR 98 | Temp 97.7°F | Ht 70.0 in | Wt 166.4 lb

## 2021-04-27 DIAGNOSIS — Z794 Long term (current) use of insulin: Secondary | ICD-10-CM | POA: Diagnosis not present

## 2021-04-27 DIAGNOSIS — Z79899 Other long term (current) drug therapy: Secondary | ICD-10-CM | POA: Insufficient documentation

## 2021-04-27 DIAGNOSIS — I4892 Unspecified atrial flutter: Secondary | ICD-10-CM | POA: Diagnosis not present

## 2021-04-27 DIAGNOSIS — I2510 Atherosclerotic heart disease of native coronary artery without angina pectoris: Secondary | ICD-10-CM | POA: Insufficient documentation

## 2021-04-27 DIAGNOSIS — Z8616 Personal history of COVID-19: Secondary | ICD-10-CM | POA: Insufficient documentation

## 2021-04-27 DIAGNOSIS — Z951 Presence of aortocoronary bypass graft: Secondary | ICD-10-CM | POA: Insufficient documentation

## 2021-04-27 DIAGNOSIS — Z992 Dependence on renal dialysis: Secondary | ICD-10-CM | POA: Insufficient documentation

## 2021-04-27 DIAGNOSIS — E1142 Type 2 diabetes mellitus with diabetic polyneuropathy: Secondary | ICD-10-CM | POA: Insufficient documentation

## 2021-04-27 DIAGNOSIS — I5022 Chronic systolic (congestive) heart failure: Secondary | ICD-10-CM | POA: Diagnosis not present

## 2021-04-27 DIAGNOSIS — I132 Hypertensive heart and chronic kidney disease with heart failure and with stage 5 chronic kidney disease, or end stage renal disease: Secondary | ICD-10-CM | POA: Insufficient documentation

## 2021-04-27 DIAGNOSIS — E11319 Type 2 diabetes mellitus with unspecified diabetic retinopathy without macular edema: Secondary | ICD-10-CM | POA: Diagnosis not present

## 2021-04-27 DIAGNOSIS — Z95811 Presence of heart assist device: Secondary | ICD-10-CM | POA: Diagnosis not present

## 2021-04-27 DIAGNOSIS — Z4801 Encounter for change or removal of surgical wound dressing: Secondary | ICD-10-CM | POA: Diagnosis present

## 2021-04-27 DIAGNOSIS — E1122 Type 2 diabetes mellitus with diabetic chronic kidney disease: Secondary | ICD-10-CM | POA: Insufficient documentation

## 2021-04-27 DIAGNOSIS — N186 End stage renal disease: Secondary | ICD-10-CM | POA: Diagnosis not present

## 2021-04-27 DIAGNOSIS — D649 Anemia, unspecified: Secondary | ICD-10-CM | POA: Insufficient documentation

## 2021-04-27 DIAGNOSIS — Z09 Encounter for follow-up examination after completed treatment for conditions other than malignant neoplasm: Secondary | ICD-10-CM | POA: Insufficient documentation

## 2021-04-27 DIAGNOSIS — K3184 Gastroparesis: Secondary | ICD-10-CM | POA: Diagnosis not present

## 2021-04-27 DIAGNOSIS — I4891 Unspecified atrial fibrillation: Secondary | ICD-10-CM | POA: Diagnosis not present

## 2021-04-27 DIAGNOSIS — E1136 Type 2 diabetes mellitus with diabetic cataract: Secondary | ICD-10-CM | POA: Insufficient documentation

## 2021-04-27 DIAGNOSIS — Z7901 Long term (current) use of anticoagulants: Secondary | ICD-10-CM | POA: Diagnosis not present

## 2021-04-27 DIAGNOSIS — I255 Ischemic cardiomyopathy: Secondary | ICD-10-CM

## 2021-04-27 NOTE — Progress Notes (Signed)
Patient presents for 1 mo f/u in [facility] today with brother Marcus Johnson). Reports no problems with VAD equipment or concerns with drive line.   Patient arrived in w/c but walked from scales to exam room 1 without difficulty. Pt reports he gets around "pretty well", reports his stamina is "about the same". He says he is doing "ok" with stairs  at home. He says he was evaluated for cardiac rehab and was given option of MWF mornings. He says he cannot come on those days due to conflict with dialysis treatments on those days; says dialysis leaves him exhausted and he doesn't feel he could do both.    Patient reports he continues M/W/F dialysis and that is dry weight has been dropped again to 74 kg (163 lbs) and his dialysis time has increased from 4 hrs to 4 hrs 15 mins. Pt confirms he decreased his midodrine to 5 mg on HD days only. He denies any lightheaded or dizzy spells.    Pt reports his cough has not returned (two previous thoracentesis for left sided pleural effusion).    Recent remote ICD device check showed afib for 6 hrs over 3 days with AF burden 3.8%. Reviewed by Darrick Grinder, NP. Pt denies any symptoms on those days. Cardiac monitor shows sinus rhythm today. Will continue to monitor.    Pt reports daily home blood sugar testing reveal BS < 100. He is no longer requiring sliding scale insulin. He will continue to monitor blood sugars.    Labs drawn weekly at dialysis. Results from 04/19/21 reviewed. K+ 3.3 - pt reports dialysis is aware and is treating. They also told him to add K+ rich foods to diet (ex: banana).      Vital Signs:  Temp: 97.7 Doppler Pressure: 90 Automatc BP: 122/69 (87) HR:  98% RA   Weight: 166.4 lb w/ eqt Last wt: 173.2 lb     VAD Indication: Destination Therapy due to CKD Stage 3b   LVAD assessment:  HM III:: VAD Speed: 5200 rpms Flow:  4.2 Power: 3.9w    PI:  4.5 Alarms: no external power x 1 (brief power outage per patient) Events: rare  Fixed speed:   5200 Low speed limit:  4900   Primary controller: back up battery due for replacement in 25 months Secondary controller:  back up battery due for replacement in 27 months     I reviewed the LVAD parameters from today and compared the results to the patient's prior recorded data. LVAD interrogation was NEGATIVE for significant power changes, NEGATIVE for clinical alarms and STABLE for PI events/speed drops. No programming changes were made and pump is functioning within specified parameters. Pt is performing daily controller and system monitor self tests along with completing weekly and monthly maintenance for LVAD equipment.   LVAD equipment check completed and is in good working order. Back-up equipment present.    Annual Equipment Maintenance on UBC/PM was performed 3/22.   Exit Site Care: VAD dressing and anchor removed and site care performed using sterile technique. Drive line exit site cleaned with Chlora prep applicators x 2, allowed to dry before Sorbaview dressing with silverlon patch applied. Exit site incorporated. The velour is fully implanted at exit site. No redness or tenderness noted. Drive line anchor re-applied. Pt denies fever or chills. Driveline dressing is being changed once weekly per sterile technique by son Marcus Johnson).  Provided with 8 weekly dressing kits. Patient not ready to start showering yet, feels it is "too much"  to manage VAD equipment, drive line, and dialysis catheter.    Device: Medtronic single Therapies: on 150 bpm Pacing: VVI 40 Last check:  04/11/21   BP & Labs:  Doppler 90 - Doppler is reflecting MAP   Hgb 11.3 - No S/S of bleeding. Specifically denies melena/BRBPR or nosebleeds.   LDH  not done - established baseline of 160 - 180. Denies tea-colored urine. No power elevations noted on interrogation.      Patient Instructions: No change in medications. Return to [facility] in 2 months.      Zada Girt BSN, RN VAD Coordinator  Office:  [phone] 24/7 Emergency VAD Pager: [phone]   Follow up for Heart Failure/LVAD:  Marcus Johnson. is a 60 y.o. male who has a h/o DM2, CAD s/p CABG 3762, systolic HF due to ischemic cardiomyopathy with EF 20-25% (echo 12/15), DM2 and CKD. He is s/p Medtronic single chamber ICD.   Admitted in 12/15 due to ADHF. Required short course milrinone for diuresis. Diuresed 30 pounds.    CPX 2/16 showed severely reduced functional capacity.  There was a significant disconnect between his symptoms and the CPX.  RHC in 6/16 showed fairly normal filling pressures and low but not markedly low cardiac output.    CPX (4/19) was submaximal but suggested severe limitation from HF.    He was admitted in 6/19 with marked volume overload and dyspnea.  Echo in 6/19 showed EF 20% with severe global hypokinesis.  LHC/RHC showed severe native vessel CAD with patent LIMA-D, SVG-OM, and SVG-PLV; cardiac index low.  He was started on milrinone and diuresed.  We discussed LVAD extensively in light of low output, creatinine that is trending up and concern for decompensation of RV over time. He was adamant that he did not want to pursue LVAD workup yet and did not want a referral for transplant evaluation. Milrinone was weaned off and he was discharged home.    Admitted 08/01/18-08/07/18 with volume overload and left foot wound. Diuresed 38 lbs with IV lasix and then transitioned back to torsemide 100 mg BID. ID was consulted with concerns for left foot osteomyelitis noted on CT scan. He got IV ancef q8 hours x 42 days. [facility] provided teaching and supplies for home antibiotics. Course complicated by AKI. DC weight 200 lbs.    He went into atrial flutter and had DCCV to NSR in 1/20.    Echo in 4/21 showed EF 25% with apical akinesis and diffuse hypokinesis relatively preserved in lateral wall, mildly decreased RV systolic function, PASP 41 mmHg. CPX in 5/21 showed severe functional limitation suggestive of advanced HF. 5/21  Zio patch showed short atrial flutter runs, 3.8% PVCs.    He developed COVID-19 infection in 1/22.  Symptoms were predominantly GI.    He developed progressive worsening of CHF in early 2022 and was admitted in 3/22 with low output HF.  Impella 5.5 was placed as bridge to Heartmate III LVAD placed in 10/13/20.  He developed post-op renal failure and ended up on dialysis.  He was markedly deconditioned post-op and went to CIR.  Now he is at home.   Today he returns for HF/LAVD  follow up.Overall feeling fine. Denies SOB/PND/Orthopnea. Able to walk up several steps . No bleeding issues. Tolerating dialysis. Appetite ok. No fever or chills. Dry weight 74 kg. Taking all medications.  I reviewed the LVAD parameters from today, and compared the results to the patient's prior recorded data.  No programming changes were  made.  The LVAD is functioning within specified parameters.  The patient performs LVAD self-test daily.  LVAD interrogation was negative for any significant power changes, alarms or PI events/speed drops.  LVAD equipment check completed and is in good working order.  Back-up equipment present.   LVAD education done on emergency procedures and precautions and reviewed exit site care.  Optivol with 6 hours A fib x 3 days.   Labs (5/22): LDH 174, hgb 10.6 Labs (7/22): LDH 164 Labs (8/22): hgb 11.4 Labs (04/20/21): Creatinine 2.66 Hgb 11.3  Labs (04/25/21): 1.7   LVAD Interrogation HM III: See LVAD note.    Past Medical History:  Diagnosis Date   AICD (automatic cardioverter/defibrillator) present    Single-chamber  implantable cardiac defibrillator - Medtronic   Atrial fibrillation (HCC)    Cataract    Mixed form OU   CHF (congestive heart failure) (HCC)    Chronic kidney disease    Chronic kidney disease (CKD), stage III (moderate) (HCC)    Chronic systolic heart failure ([place])    a. Echo 5/13: Mild LVE, mild LVH, EF 10%, anteroseptal, lateral, apical AK, mild MR, mild LAE,  moderately reduced RVSF, mild RAE, PASP 60;  b. 07/2014 Echo: EF 20-2%, diff HK, AKI of antsep/apical/mid-apicalinferior, mod reduced RV.   Coronary artery disease    a. s/p CABG 2002 b. LHC 5/13:  dLM 80%, LAD subtotally occluded, pCFX occluded, pRCA 50%, mid? Occlusion with high take off of the PDA with 70% multiple lesions-not bypassed and supplies collaterals to LAD, LIMA-IM/ramus ok, S-OM ok, S-PLA branches ok. Medical therapy was recommended   COVID    Diabetic retinopathy ([place])    NPDR OD, PDR OS   Dyspnea    Gout    "on daily RX" (01/08/2018)   Hypertension    Hypertensive retinopathy    OU   Ischemic cardiomyopathy    a. Echo 5/13: Mild LVE, mild LVH, EF 10%, anteroseptal, lateral, apical AK, mild MR, mild LAE, moderately reduced RVSF, mild RAE, PASP 60;  b. 01/2012 s/p MDT [id number] Protecta XT VR AICD;  c. 07/2014 Echo: EF 20-2%, diff HK, AKI of antsep/apical/mid-apicalinferior, mod reduced RV.   MRSA (methicillin resistant Staphylococcus aureus)    Status post right foot plantar deep infection with MRSA status post  I&D 10/2008   Myocardial infarction [facility])    "was told I'd had several before heart OR in 2002" (01/08/2018)   Noncompliance    Peripheral neuropathy    Retinopathy, diabetic, background ([place])    Syncope    Type II diabetes mellitus ([place])    requiring insulin    Vitreous hemorrhage, left (HCC)    and proliferative diabetic retinopathy    Current Outpatient Medications  Medication Sig Dispense Refill   acetaminophen (TYLENOL) 325 MG tablet Take 1-2 tablets (325-650 mg total) by mouth every 4 (four) hours as needed for mild pain.     ascorbic acid (VITAMIN C) 1000 MG tablet Take 1 tablet (1,000 mg total) by mouth daily. 90 tablet 3   gabapentin (NEURONTIN) 100 MG capsule Take one tablet in am and 2 tablets in evening 90 capsule 11   metoCLOPramide (REGLAN) 10 MG tablet Take 1 tablet (10 mg total) by mouth 3 (three) times daily before meals. 90 tablet 11   midodrine  (PROAMATINE) 10 MG tablet Take 0.5 tablet tid on dialysis days; NO midodrine on NON dialysis days 270 tablet 3   multivitamin (RENA-VIT) TABS tablet Take 1 tablet by mouth at bedtime.  90 tablet 3   oxyCODONE (OXY IR/ROXICODONE) 5 MG immediate release tablet Take 1 tablet (5 mg total) by mouth every 6 (six) hours as needed for severe pain. 30 tablet 0   pantoprazole (PROTONIX) 40 MG tablet Take 1 tablet (40 mg total) by mouth daily. 90 tablet 3   polyethylene glycol powder (GLYCOLAX/MIRALAX) 17 GM/SCOOP powder Dissolve 1 capful in water and drink 2 (two) times daily. 510 g 0   rosuvastatin (CRESTOR) 10 MG tablet Take 1 tablet (10 mg total) by mouth daily. 30 tablet 11   senna-docusate (SENOKOT-S) 8.6-50 MG tablet Take 2 tablets by mouth 2 (two) times daily. 120 tablet 0   traMADol HCl 100 MG TABS Take 100 mg by mouth 2 (two) times daily as needed. 30 tablet 2   warfarin (COUMADIN) 3 MG tablet Take 4.5 mg (1.5 tabs) every Tuesday/Thursday/Saturday and 3 mg (1 tab) all other days or as directed by [facility] Clinic 180 tablet 3   insulin glargine (LANTUS) 100 UNIT/ML injection Inject 0.08 mLs (8 Units total) into the skin daily. (Patient not taking: Reported on 04/27/2021) 10 mL 11   No current facility-administered medications for this encounter.    Meclizine hcl and Ivabradine  REVIEW OF SYSTEMS: All systems negative except as listed in HPI, PMH and Problem list.   LVAD INTERROGATION:  Please see LVAD nurse's note above.   I reviewed the LVAD parameters from today, and compared the results to the patient's prior recorded data.  No programming changes were made.  The LVAD is functioning within specified parameters.  The patient performs LVAD self-test daily.  LVAD interrogation was negative for any significant power changes, alarms or PI events/speed drops.  LVAD equipment check completed and is in good working order.  Back-up equipment present.   LVAD education done on emergency procedures and  precautions and reviewed exit site care.  Vitals:   04/27/21 1426 04/27/21 1427  BP: (!) 90/0 122/69  Pulse:  98  Temp:  97.7 F (36.5 C)  SpO2:  98%     Physical Exam: GENERAL: Well appearing, male who presents to clinic today in no acute distress. Walked in the clinic  HEENT: normal  NECK: Supple, JVP flat .  2+ bilaterally, no bruits.  No lymphadenopathy or thyromegaly appreciated.   CARDIAC:  Mechanical heart sounds with LVAD hum present.  LUNGS:  Clear to auscultation bilaterally.  ABDOMEN:  Soft, round, nontender, positive bowel sounds x4.     LVAD exit site: well-healed and incorporated.  Dressing dry and intact.  No erythema or drainage.  Stabilization device present and accurately applied.  Driveline dressing is being changed daily per sterile technique. EXTREMITIES:  Warm and dry, no cyanosis, clubbing, rash or edema  NEUROLOGIC:  Alert and oriented x 4.  Gait steady.  No aphasia.  No dysarthria.  Affect pleasant.      ASSESSMENT AND PLAN: 1. Chronic systolic CHF:  Ischemic cardiomyopathy. Has Medtronic ICD. Low output HF, admitted and Impella 5.5 placed 10/04/20.  HM3 LVAD was placed on 10/13/20. Developed post-op renal failure requiring eventual hemodialysis.  Volume status better and weight down after lowering dry weight at HD.  Volume status stable.   - Stop midodrine on non-HD days and decrease to 5 mg tid on HD days.  2. LVAD:  VAD interrogated personally. Parameters stable with rare PI events.  - He is now off ASA.  - Check INR 1.7  - Continue warfarin with INR goal 2-2.5.  Coumadin dose discussed  with pharmacy.  3. CAD s/p CABG 2002:  Last cath in 6/19 with patent grafts. No chest pain.  - Continue Crestor.  4. ESRD: Currently dialyzing via tunneled catheter.  - Continue HD M-W-F  - No low flows  - With continuous flow LVAD, think AV fistula would be unlikely to mature.   5. Atrial flutter/fibrillation: s/p DC-CV 08/21/18.  NSR today.  6. Right hand: Likely  neuropathic from stretch of brachial plexus with Impella placement.  - Continue gabapentin 7. DM: Per primary care.  8. Anemia: Hgb stable on recent labs.   9. Deconditioning:  Referred for cardiac rehab but conflicts with dialysis.     10. Suspected Gastroparesis. Nausea improved on Reglan.   Follow up in 4-6 weeks.    Kemba Hoppes NP-C  04/27/2021

## 2021-04-27 NOTE — Progress Notes (Signed)
Patient presents for 1 mo f/u in [facility] today with brother [name]). Reports no problems with VAD equipment or concerns with drive line.  Patient arrived in w/c but walked from scales to exam room 1 without difficulty. Pt reports he gets around "pretty well", reports his stamina is "about the same". He says he is doing "ok" with stairs  at home. He says he was evaluated for cardiac rehab and was given option of MWF mornings. He says he cannot come on those days due to conflict with dialysis treatments on those days; says dialysis leaves him exhausted and he doesn't feel he could do both.   Patient reports he continues M/W/F dialysis and that is dry weight has been dropped again to 74 kg (163 lbs) and his dialysis time has increased from 4 hrs to 4 hrs 15 mins. Pt confirms he decreased his midodrine to 5 mg on HD days only. He denies any lightheaded or dizzy spells.   Pt reports his cough has not returned (two previous thoracentesis for left sided pleural effusion).   Recent remote ICD device check showed afib for 6 hrs over 3 days with AF burden 3.8%. Reviewed by Darrick Grinder, NP. Pt denies any symptoms on those days. Cardiac monitor shows sinus rhythm today. Will continue to monitor.   Pt reports daily home blood sugar testing reveal BS < 100. He is no longer requiring sliding scale insulin. He will continue to monitor blood sugars.   Labs drawn weekly at dialysis. Results from 04/19/21 reviewed. K+ 3.3 - pt reports dialysis is aware and is treating. They also told him to add K+ rich foods to diet (ex: banana).    Vital Signs:  Temp: 97.7 Doppler Pressure: 90 Automatc BP: 122/69 (87) HR:  98% RA   Weight: 166.4 lb w/ eqt Last wt: 173.2 lb    VAD Indication: Destination Therapy due to CKD Stage 3b   LVAD assessment:  HM III:: VAD Speed: 5200 rpms Flow:  4.2 Power: 3.9w    PI:  4.5 Alarms: no external power x 1 (brief power outage per patient) Events: rare  Fixed speed:  5200 Low  speed limit:  4900  Primary controller: back up battery due for replacement in 25 months Secondary controller:  back up battery due for replacement in 27 months   I reviewed the LVAD parameters from today and compared the results to the patient's prior recorded data. LVAD interrogation was NEGATIVE for significant power changes, NEGATIVE for clinical alarms and STABLE for PI events/speed drops. No programming changes were made and pump is functioning within specified parameters. Pt is performing daily controller and system monitor self tests along with completing weekly and monthly maintenance for LVAD equipment.   LVAD equipment check completed and is in good working order. Back-up equipment present.    Annual Equipment Maintenance on UBC/PM was performed 3/22.  Exit Site Care: VAD dressing and anchor removed and site care performed using sterile technique. Drive line exit site cleaned with Chlora prep applicators x 2, allowed to dry before Sorbaview dressing with silverlon patch applied. Exit site incorporated. The velour is fully implanted at exit site. No redness or tenderness noted. Drive line anchor re-applied. Pt denies fever or chills. Driveline dressing is being changed once weekly per sterile technique by son [name]).  Provided with 8 weekly dressing kits. Patient not ready to start showering yet, feels it is "too much" to manage VAD equipment, drive line, and dialysis catheter.   Device: Medtronic  single Therapies: on 150 bpm Pacing: VVI 40 Last check:  04/11/21   BP & Labs:  Doppler 90 - Doppler is reflecting MAP   Hgb 11.3 - No S/S of bleeding. Specifically denies melena/BRBPR or nosebleeds.   LDH  not done - established baseline of 160 - 180. Denies tea-colored urine. No power elevations noted on interrogation.    Patient Instructions: No change in medications. Return to [facility] in 2 months.    Zada Girt BSN, RN VAD Coordinator  Office: [phone] 24/7 Emergency VAD  Pager: [phone]

## 2021-04-28 DIAGNOSIS — Z992 Dependence on renal dialysis: Secondary | ICD-10-CM | POA: Diagnosis not present

## 2021-04-28 DIAGNOSIS — N186 End stage renal disease: Secondary | ICD-10-CM | POA: Diagnosis not present

## 2021-04-28 DIAGNOSIS — N2581 Secondary hyperparathyroidism of renal origin: Secondary | ICD-10-CM | POA: Diagnosis not present

## 2021-05-02 ENCOUNTER — Ambulatory Visit (HOSPITAL_COMMUNITY): Payer: Self-pay | Admitting: Pharmacist

## 2021-05-02 LAB — POCT INR: INR: 2 (ref 2.0–3.0)

## 2021-05-02 NOTE — Progress Notes (Signed)
LVAD INR 

## 2021-05-03 DIAGNOSIS — N2581 Secondary hyperparathyroidism of renal origin: Secondary | ICD-10-CM | POA: Diagnosis not present

## 2021-05-03 DIAGNOSIS — Z992 Dependence on renal dialysis: Secondary | ICD-10-CM | POA: Diagnosis not present

## 2021-05-03 DIAGNOSIS — N186 End stage renal disease: Secondary | ICD-10-CM | POA: Diagnosis not present

## 2021-05-05 DIAGNOSIS — Z992 Dependence on renal dialysis: Secondary | ICD-10-CM | POA: Diagnosis not present

## 2021-05-05 DIAGNOSIS — I509 Heart failure, unspecified: Secondary | ICD-10-CM | POA: Diagnosis not present

## 2021-05-05 DIAGNOSIS — N186 End stage renal disease: Secondary | ICD-10-CM | POA: Diagnosis not present

## 2021-05-05 DIAGNOSIS — N2581 Secondary hyperparathyroidism of renal origin: Secondary | ICD-10-CM | POA: Diagnosis not present

## 2021-05-08 DIAGNOSIS — Z992 Dependence on renal dialysis: Secondary | ICD-10-CM | POA: Diagnosis not present

## 2021-05-08 DIAGNOSIS — N2581 Secondary hyperparathyroidism of renal origin: Secondary | ICD-10-CM | POA: Diagnosis not present

## 2021-05-08 DIAGNOSIS — N186 End stage renal disease: Secondary | ICD-10-CM | POA: Diagnosis not present

## 2021-05-09 ENCOUNTER — Ambulatory Visit (HOSPITAL_COMMUNITY): Payer: Self-pay | Admitting: Pharmacist

## 2021-05-09 DIAGNOSIS — Z7901 Long term (current) use of anticoagulants: Secondary | ICD-10-CM | POA: Diagnosis not present

## 2021-05-09 LAB — POCT INR: INR: 2.1 (ref 2.0–3.0)

## 2021-05-09 NOTE — Progress Notes (Signed)
LVAD INR 

## 2021-05-10 DIAGNOSIS — N186 End stage renal disease: Secondary | ICD-10-CM | POA: Diagnosis not present

## 2021-05-10 DIAGNOSIS — N2581 Secondary hyperparathyroidism of renal origin: Secondary | ICD-10-CM | POA: Diagnosis not present

## 2021-05-10 DIAGNOSIS — Z992 Dependence on renal dialysis: Secondary | ICD-10-CM | POA: Diagnosis not present

## 2021-05-11 ENCOUNTER — Other Ambulatory Visit (HOSPITAL_COMMUNITY): Payer: Self-pay | Admitting: Cardiology

## 2021-05-11 DIAGNOSIS — R5381 Other malaise: Secondary | ICD-10-CM | POA: Diagnosis not present

## 2021-05-12 DIAGNOSIS — Z992 Dependence on renal dialysis: Secondary | ICD-10-CM | POA: Diagnosis not present

## 2021-05-12 DIAGNOSIS — N186 End stage renal disease: Secondary | ICD-10-CM | POA: Diagnosis not present

## 2021-05-12 DIAGNOSIS — N2581 Secondary hyperparathyroidism of renal origin: Secondary | ICD-10-CM | POA: Diagnosis not present

## 2021-05-15 DIAGNOSIS — N186 End stage renal disease: Secondary | ICD-10-CM | POA: Diagnosis not present

## 2021-05-15 DIAGNOSIS — Z992 Dependence on renal dialysis: Secondary | ICD-10-CM | POA: Diagnosis not present

## 2021-05-15 DIAGNOSIS — N2581 Secondary hyperparathyroidism of renal origin: Secondary | ICD-10-CM | POA: Diagnosis not present

## 2021-05-16 ENCOUNTER — Ambulatory Visit (HOSPITAL_COMMUNITY): Payer: Self-pay | Admitting: Pharmacist

## 2021-05-16 LAB — POCT INR: INR: 1.9 — AB (ref 2.0–3.0)

## 2021-05-16 NOTE — Progress Notes (Signed)
LVAD INR 

## 2021-05-17 DIAGNOSIS — Z992 Dependence on renal dialysis: Secondary | ICD-10-CM | POA: Diagnosis not present

## 2021-05-17 DIAGNOSIS — N2581 Secondary hyperparathyroidism of renal origin: Secondary | ICD-10-CM | POA: Diagnosis not present

## 2021-05-17 DIAGNOSIS — N186 End stage renal disease: Secondary | ICD-10-CM | POA: Diagnosis not present

## 2021-05-19 DIAGNOSIS — Z992 Dependence on renal dialysis: Secondary | ICD-10-CM | POA: Diagnosis not present

## 2021-05-19 DIAGNOSIS — N2581 Secondary hyperparathyroidism of renal origin: Secondary | ICD-10-CM | POA: Diagnosis not present

## 2021-05-19 DIAGNOSIS — N186 End stage renal disease: Secondary | ICD-10-CM | POA: Diagnosis not present

## 2021-05-22 DIAGNOSIS — N186 End stage renal disease: Secondary | ICD-10-CM | POA: Diagnosis not present

## 2021-05-22 DIAGNOSIS — N2581 Secondary hyperparathyroidism of renal origin: Secondary | ICD-10-CM | POA: Diagnosis not present

## 2021-05-22 DIAGNOSIS — Z992 Dependence on renal dialysis: Secondary | ICD-10-CM | POA: Diagnosis not present

## 2021-05-23 ENCOUNTER — Ambulatory Visit (HOSPITAL_COMMUNITY): Payer: Self-pay | Admitting: Pharmacist

## 2021-05-23 LAB — POCT INR: INR: 1.9 — AB (ref 2.0–3.0)

## 2021-05-23 NOTE — Progress Notes (Signed)
LVAD INR 

## 2021-05-24 ENCOUNTER — Other Ambulatory Visit: Payer: Self-pay | Admitting: Family Medicine

## 2021-05-24 DIAGNOSIS — Z794 Long term (current) use of insulin: Secondary | ICD-10-CM

## 2021-05-24 DIAGNOSIS — N2581 Secondary hyperparathyroidism of renal origin: Secondary | ICD-10-CM | POA: Diagnosis not present

## 2021-05-24 DIAGNOSIS — N186 End stage renal disease: Secondary | ICD-10-CM | POA: Diagnosis not present

## 2021-05-24 DIAGNOSIS — N183 Chronic kidney disease, stage 3 unspecified: Secondary | ICD-10-CM

## 2021-05-24 DIAGNOSIS — Z992 Dependence on renal dialysis: Secondary | ICD-10-CM | POA: Diagnosis not present

## 2021-05-26 DIAGNOSIS — N2581 Secondary hyperparathyroidism of renal origin: Secondary | ICD-10-CM | POA: Diagnosis not present

## 2021-05-26 DIAGNOSIS — Z992 Dependence on renal dialysis: Secondary | ICD-10-CM | POA: Diagnosis not present

## 2021-05-26 DIAGNOSIS — N186 End stage renal disease: Secondary | ICD-10-CM | POA: Diagnosis not present

## 2021-05-29 DIAGNOSIS — Z992 Dependence on renal dialysis: Secondary | ICD-10-CM | POA: Diagnosis not present

## 2021-05-29 DIAGNOSIS — N2581 Secondary hyperparathyroidism of renal origin: Secondary | ICD-10-CM | POA: Diagnosis not present

## 2021-05-29 DIAGNOSIS — N186 End stage renal disease: Secondary | ICD-10-CM | POA: Diagnosis not present

## 2021-05-30 ENCOUNTER — Ambulatory Visit (HOSPITAL_COMMUNITY): Payer: Self-pay | Admitting: Pharmacist

## 2021-05-30 ENCOUNTER — Other Ambulatory Visit: Payer: Self-pay

## 2021-05-30 LAB — POCT INR: INR: 2 (ref 2.0–3.0)

## 2021-05-30 MED ORDER — INSULIN GLARGINE 100 UNIT/ML ~~LOC~~ SOLN: 8.0000 [IU] | Freq: Every day | SUBCUTANEOUS | 11 refills | Status: DC

## 2021-05-30 NOTE — Progress Notes (Signed)
LVAD INR 

## 2021-05-31 DIAGNOSIS — N186 End stage renal disease: Secondary | ICD-10-CM | POA: Diagnosis not present

## 2021-05-31 DIAGNOSIS — Z992 Dependence on renal dialysis: Secondary | ICD-10-CM | POA: Diagnosis not present

## 2021-05-31 DIAGNOSIS — N2581 Secondary hyperparathyroidism of renal origin: Secondary | ICD-10-CM | POA: Diagnosis not present

## 2021-06-02 ENCOUNTER — Other Ambulatory Visit: Payer: Self-pay | Admitting: Family Medicine

## 2021-06-02 DIAGNOSIS — Z794 Long term (current) use of insulin: Secondary | ICD-10-CM

## 2021-06-02 DIAGNOSIS — N183 Chronic kidney disease, stage 3 unspecified: Secondary | ICD-10-CM

## 2021-06-02 DIAGNOSIS — N2581 Secondary hyperparathyroidism of renal origin: Secondary | ICD-10-CM | POA: Diagnosis not present

## 2021-06-02 DIAGNOSIS — Z992 Dependence on renal dialysis: Secondary | ICD-10-CM | POA: Diagnosis not present

## 2021-06-02 DIAGNOSIS — N186 End stage renal disease: Secondary | ICD-10-CM | POA: Diagnosis not present

## 2021-06-05 DIAGNOSIS — I509 Heart failure, unspecified: Secondary | ICD-10-CM | POA: Diagnosis not present

## 2021-06-05 DIAGNOSIS — N2581 Secondary hyperparathyroidism of renal origin: Secondary | ICD-10-CM | POA: Diagnosis not present

## 2021-06-05 DIAGNOSIS — Z992 Dependence on renal dialysis: Secondary | ICD-10-CM | POA: Diagnosis not present

## 2021-06-05 DIAGNOSIS — N186 End stage renal disease: Secondary | ICD-10-CM | POA: Diagnosis not present

## 2021-06-06 ENCOUNTER — Ambulatory Visit (HOSPITAL_COMMUNITY): Payer: Self-pay | Admitting: Pharmacist

## 2021-06-06 DIAGNOSIS — Z7901 Long term (current) use of anticoagulants: Secondary | ICD-10-CM | POA: Diagnosis not present

## 2021-06-06 LAB — POCT INR: INR: 1.6 — AB (ref 2.0–3.0)

## 2021-06-06 NOTE — Progress Notes (Signed)
LVAD INR 

## 2021-06-07 DIAGNOSIS — N2581 Secondary hyperparathyroidism of renal origin: Secondary | ICD-10-CM | POA: Diagnosis not present

## 2021-06-07 DIAGNOSIS — N186 End stage renal disease: Secondary | ICD-10-CM | POA: Diagnosis not present

## 2021-06-07 DIAGNOSIS — Z992 Dependence on renal dialysis: Secondary | ICD-10-CM | POA: Diagnosis not present

## 2021-06-08 ENCOUNTER — Other Ambulatory Visit (HOSPITAL_COMMUNITY): Payer: Self-pay | Admitting: Cardiology

## 2021-06-09 DIAGNOSIS — N186 End stage renal disease: Secondary | ICD-10-CM | POA: Diagnosis not present

## 2021-06-09 DIAGNOSIS — Z992 Dependence on renal dialysis: Secondary | ICD-10-CM | POA: Diagnosis not present

## 2021-06-09 DIAGNOSIS — N2581 Secondary hyperparathyroidism of renal origin: Secondary | ICD-10-CM | POA: Diagnosis not present

## 2021-06-11 DIAGNOSIS — R5381 Other malaise: Secondary | ICD-10-CM | POA: Diagnosis not present

## 2021-06-12 DIAGNOSIS — N2581 Secondary hyperparathyroidism of renal origin: Secondary | ICD-10-CM | POA: Diagnosis not present

## 2021-06-12 DIAGNOSIS — N186 End stage renal disease: Secondary | ICD-10-CM | POA: Diagnosis not present

## 2021-06-12 DIAGNOSIS — Z992 Dependence on renal dialysis: Secondary | ICD-10-CM | POA: Diagnosis not present

## 2021-06-13 ENCOUNTER — Ambulatory Visit (HOSPITAL_COMMUNITY): Payer: Self-pay | Admitting: Pharmacist

## 2021-06-13 LAB — POCT INR: INR: 1.4 — AB (ref 2.0–3.0)

## 2021-06-13 NOTE — Progress Notes (Signed)
LVAD INR 

## 2021-06-14 DIAGNOSIS — N2581 Secondary hyperparathyroidism of renal origin: Secondary | ICD-10-CM | POA: Diagnosis not present

## 2021-06-14 DIAGNOSIS — N186 End stage renal disease: Secondary | ICD-10-CM | POA: Diagnosis not present

## 2021-06-14 DIAGNOSIS — Z992 Dependence on renal dialysis: Secondary | ICD-10-CM | POA: Diagnosis not present

## 2021-06-16 DIAGNOSIS — N186 End stage renal disease: Secondary | ICD-10-CM | POA: Diagnosis not present

## 2021-06-16 DIAGNOSIS — N2581 Secondary hyperparathyroidism of renal origin: Secondary | ICD-10-CM | POA: Diagnosis not present

## 2021-06-16 DIAGNOSIS — Z992 Dependence on renal dialysis: Secondary | ICD-10-CM | POA: Diagnosis not present

## 2021-06-19 DIAGNOSIS — N2581 Secondary hyperparathyroidism of renal origin: Secondary | ICD-10-CM | POA: Diagnosis not present

## 2021-06-19 DIAGNOSIS — N186 End stage renal disease: Secondary | ICD-10-CM | POA: Diagnosis not present

## 2021-06-19 DIAGNOSIS — Z992 Dependence on renal dialysis: Secondary | ICD-10-CM | POA: Diagnosis not present

## 2021-06-20 ENCOUNTER — Ambulatory Visit (HOSPITAL_COMMUNITY): Payer: Self-pay | Admitting: Pharmacist

## 2021-06-20 ENCOUNTER — Other Ambulatory Visit (HOSPITAL_COMMUNITY): Payer: Self-pay

## 2021-06-20 LAB — POCT INR: INR: 1.2 — AB (ref 2.0–3.0)

## 2021-06-20 NOTE — Progress Notes (Signed)
LVAD INR 

## 2021-06-21 DIAGNOSIS — Z992 Dependence on renal dialysis: Secondary | ICD-10-CM | POA: Diagnosis not present

## 2021-06-21 DIAGNOSIS — N186 End stage renal disease: Secondary | ICD-10-CM | POA: Diagnosis not present

## 2021-06-21 DIAGNOSIS — N2581 Secondary hyperparathyroidism of renal origin: Secondary | ICD-10-CM | POA: Diagnosis not present

## 2021-06-23 DIAGNOSIS — N2581 Secondary hyperparathyroidism of renal origin: Secondary | ICD-10-CM | POA: Diagnosis not present

## 2021-06-23 DIAGNOSIS — N186 End stage renal disease: Secondary | ICD-10-CM | POA: Diagnosis not present

## 2021-06-23 DIAGNOSIS — Z992 Dependence on renal dialysis: Secondary | ICD-10-CM | POA: Diagnosis not present

## 2021-06-26 ENCOUNTER — Ambulatory Visit (HOSPITAL_COMMUNITY): Payer: Self-pay | Admitting: Pharmacist

## 2021-06-26 LAB — POCT INR: INR: 3.2 — AB (ref 2.0–3.0)

## 2021-06-26 NOTE — Progress Notes (Signed)
LVAD INR 

## 2021-06-27 ENCOUNTER — Ambulatory Visit (HOSPITAL_COMMUNITY)
Admission: RE | Admit: 2021-06-27 | Discharge: 2021-06-27 | Disposition: A | Discharge status: Home / Self Care | Payer: Medicare HMO | Source: Ambulatory Visit | Attending: Family Medicine | Admitting: Family Medicine

## 2021-06-27 ENCOUNTER — Encounter (HOSPITAL_COMMUNITY): Payer: Self-pay

## 2021-06-27 VITALS — BP 115/50 | HR 90 | Wt 158.4 lb

## 2021-06-27 DIAGNOSIS — I4891 Unspecified atrial fibrillation: Secondary | ICD-10-CM | POA: Diagnosis not present

## 2021-06-27 DIAGNOSIS — I5022 Chronic systolic (congestive) heart failure: Secondary | ICD-10-CM | POA: Insufficient documentation

## 2021-06-27 DIAGNOSIS — Z992 Dependence on renal dialysis: Secondary | ICD-10-CM | POA: Insufficient documentation

## 2021-06-27 DIAGNOSIS — I4892 Unspecified atrial flutter: Secondary | ICD-10-CM | POA: Insufficient documentation

## 2021-06-27 DIAGNOSIS — I5042 Chronic combined systolic (congestive) and diastolic (congestive) heart failure: Secondary | ICD-10-CM | POA: Diagnosis not present

## 2021-06-27 DIAGNOSIS — Z8616 Personal history of COVID-19: Secondary | ICD-10-CM | POA: Diagnosis not present

## 2021-06-27 DIAGNOSIS — E1136 Type 2 diabetes mellitus with diabetic cataract: Secondary | ICD-10-CM | POA: Diagnosis not present

## 2021-06-27 DIAGNOSIS — Z79899 Other long term (current) drug therapy: Secondary | ICD-10-CM | POA: Insufficient documentation

## 2021-06-27 DIAGNOSIS — I132 Hypertensive heart and chronic kidney disease with heart failure and with stage 5 chronic kidney disease, or end stage renal disease: Secondary | ICD-10-CM | POA: Diagnosis not present

## 2021-06-27 DIAGNOSIS — K3184 Gastroparesis: Secondary | ICD-10-CM | POA: Diagnosis not present

## 2021-06-27 DIAGNOSIS — E1122 Type 2 diabetes mellitus with diabetic chronic kidney disease: Secondary | ICD-10-CM | POA: Diagnosis not present

## 2021-06-27 DIAGNOSIS — M79641 Pain in right hand: Secondary | ICD-10-CM | POA: Insufficient documentation

## 2021-06-27 DIAGNOSIS — N1832 Chronic kidney disease, stage 3b: Secondary | ICD-10-CM | POA: Diagnosis not present

## 2021-06-27 DIAGNOSIS — D649 Anemia, unspecified: Secondary | ICD-10-CM | POA: Insufficient documentation

## 2021-06-27 DIAGNOSIS — I2510 Atherosclerotic heart disease of native coronary artery without angina pectoris: Secondary | ICD-10-CM | POA: Insufficient documentation

## 2021-06-27 DIAGNOSIS — E1142 Type 2 diabetes mellitus with diabetic polyneuropathy: Secondary | ICD-10-CM | POA: Diagnosis not present

## 2021-06-27 DIAGNOSIS — Z9181 History of falling: Secondary | ICD-10-CM | POA: Diagnosis not present

## 2021-06-27 DIAGNOSIS — N186 End stage renal disease: Secondary | ICD-10-CM | POA: Insufficient documentation

## 2021-06-27 DIAGNOSIS — Z7901 Long term (current) use of anticoagulants: Secondary | ICD-10-CM | POA: Diagnosis not present

## 2021-06-27 DIAGNOSIS — Z95810 Presence of automatic (implantable) cardiac defibrillator: Secondary | ICD-10-CM | POA: Diagnosis not present

## 2021-06-27 DIAGNOSIS — Z95811 Presence of heart assist device: Secondary | ICD-10-CM | POA: Diagnosis not present

## 2021-06-27 DIAGNOSIS — E11319 Type 2 diabetes mellitus with unspecified diabetic retinopathy without macular edema: Secondary | ICD-10-CM | POA: Insufficient documentation

## 2021-06-27 DIAGNOSIS — I255 Ischemic cardiomyopathy: Secondary | ICD-10-CM | POA: Insufficient documentation

## 2021-06-27 DIAGNOSIS — Z951 Presence of aortocoronary bypass graft: Secondary | ICD-10-CM | POA: Insufficient documentation

## 2021-06-27 DIAGNOSIS — I951 Orthostatic hypotension: Secondary | ICD-10-CM

## 2021-06-27 MED ORDER — MIDODRINE HCL 10 MG PO TABS: 10.0000 mg | ORAL_TABLET | Freq: Three times a day (TID) | ORAL | 6 refills | Status: DC

## 2021-06-27 NOTE — Progress Notes (Addendum)
Patient presents for 2 mo f/u in [place] today with brother [name]). Reports no problems with VAD equipment or concerns with drive line.  Patient arrived in w/c but walked from scales to exam room 1 without difficulty. Pt reports he gets around "pretty well", reports his stamina is "about the same".   Patient reports he continues M/W/F dialysis and that is dry weight has been dropped again to 74 kg (163 lbs) and his dialysis time has increased from 4 hrs to 4 hrs 15 mins. Pt confirms he is taking midodrine to 5 mg TID on HD days only. Reports lightheadedness/dizziness after dialysis treatments. He has sustained 2 falls while climbing the stairs last week after dialysis. No injuries noted. Per Dr Aundra Dubin will increase dry weight to 165 lb, and increase Midodrine to 10 mg TID on HD days. Updated prescription sent in to patient's pharmacy. Called and spoke with Hotel manager at [facility] and made aware of dry weight increase. Order faxed over for dry weight increase.   Pt reports his cough has not returned (two previous thoracentesis for left sided pleural effusion). Instructed to call VAD coordinators if cough reoccurs with dry weight increase. He verbalized understanding.   Pt had nosebleed yesterday. Reports it took "5 hours" to stop bleeding. No bleeding noted today. Discussed use of saline nasal spray as needed for dry nose. He verbalized understanding.   Pt reports continued right hand pain from possible brachial plexus injury related to Impella placement pre-VAD. Reports weakness, intermittent numbness, and intermittent tingling of his fingers. States he is still working on PT exercises at home, with no noted improvement. Will refer him to neurology per Dr Aundra Dubin for nerve conduction studies.   VAD drive line, controller, and power cords all tangled and bent at odd angles. VAD coordinator untangled equipment, and reiterated importance of keeping equipment untangled, and not bent at odd angles due to  risk for wire fracture. Both pt and [name] verbalized understanding.   Pt reports daily home blood sugar testing reveal BS < 100. He is no longer requiring sliding scale insulin. He will continue to monitor blood sugars. Encouraged him to have continued follow up with his PCP for diabetes management as needed.   Labs drawn weekly at dialysis. Results from 06/22/21 reviewed. K+ 3.4 - pt reports dialysis is aware and is treating. They also told him to add K+ rich foods to diet (ex: banana).   Vital Signs:  Temp:  Doppler Pressure: 96 Automatc BP: 115/50 (71) HR:  89  O2: 98% RA   Weight: 158.4 lb w/ eqt Last wt: 166.4 lb    VAD Indication: Destination Therapy due to CKD Stage 3b   LVAD assessment:  HM III:: VAD Speed: 5200 rpms Flow:  4.3 Power: 3.9w    PI: 3.8 Alarms: none Events: 10-20 daily; 11/18- 60+ while at HD  Fixed speed:  5200 Low speed limit:  4900  Primary controller: back up battery due for replacement in 23 months Secondary controller:  back up battery due for replacement in 27 months   I reviewed the LVAD parameters from today and compared the results to the patient's prior recorded data. LVAD interrogation was NEGATIVE for significant power changes, NEGATIVE for clinical alarms and STABLE for PI events/speed drops. No programming changes were made and pump is functioning within specified parameters. Pt is performing daily controller and system monitor self tests along with completing weekly and monthly maintenance for LVAD equipment.   LVAD equipment check  completed and is in good working order. Back-up equipment present.    Annual Equipment Maintenance on UBC/PM was performed 3/22.  Exit Site Care: VAD dressing and anchor removed and site care performed using sterile technique. Drive line exit site cleaned with Chlora prep applicators x 2, allowed to dry before Sorbaview dressing with silverlon patch applied. Exit site incorporated. The velour is fully  implanted at exit site. No redness or tenderness noted. Drive line anchor re-applied. Pt denies fever or chills. Driveline dressing is being changed once weekly per sterile technique by son [name]).  Provided with 8 weekly dressing kits. Patient not ready to start showering yet, feels it is "too much" to manage VAD equipment, drive line, and dialysis catheter.   Device: Medtronic single Therapies: on 150 bpm Pacing: VVI 40 Last check:  04/11/21   BP & Labs:  Doppler 96 - Doppler is reflecting modified systolic   Hgb 29.9 - No S/S of bleeding. Specifically denies melena/BRBPR or nosebleeds.   LDH  not done - established baseline of 160 - 180. Denies tea-colored urine. No power elevations noted on interrogation.    Patient Instructions: Increase Midodrine to 10 mg three times a day on dialysis days. Do not take on non-dialysis days. New prescription sent in to your pharmacy. We will increase your dry weight to 165 lbs.  Coumadin dosing per Ander Purpura PharmD We will refer you to [facility] for assessment of your right hand Return to [facility] in 6 weeks for follow up with Dr Dwyane Dee RN [place] Coordinator  Office: [phone]  24/7 Pager: [phone]    Follow up for Heart Failure/LVAD:  [facility]. is a 60 y.o. male who has a h/o DM2, CAD s/p CABG 1941, systolic HF due to ischemic cardiomyopathy with EF 20-25% (echo 12/15), DM2 and CKD. He is s/p Medtronic single chamber ICD.   Admitted in 12/15 due to ADHF. Required short course milrinone for diuresis. Diuresed 30 pounds.    CPX 2/16 showed severely reduced functional capacity.  There was a significant disconnect between his symptoms and the CPX.  RHC in 6/16 showed fairly normal filling pressures and low but not markedly low cardiac output.    CPX (4/19) was submaximal but suggested severe limitation from HF.    He was admitted in 6/19 with marked volume overload and dyspnea.  Echo in 6/19 showed EF  20% with severe global hypokinesis.  LHC/RHC showed severe native vessel CAD with patent LIMA-D, SVG-OM, and SVG-PLV; cardiac index low.  He was started on milrinone and diuresed.  We discussed LVAD extensively in light of low output, creatinine that is trending up and concern for decompensation of RV over time. He was adamant that he did not want to pursue LVAD workup yet and did not want a referral for transplant evaluation. Milrinone was weaned off and he was discharged home.    Admitted 08/01/18-08/07/18 with volume overload and left foot wound. Diuresed 38 lbs with IV lasix and then transitioned back to torsemide 100 mg BID. ID was consulted with concerns for left foot osteomyelitis noted on CT scan. He got IV ancef q8 hours x 42 days. [facility] provided teaching and supplies for home antibiotics. Course complicated by AKI. DC weight 200 lbs.    He went into atrial flutter and had DCCV to NSR in 1/20.    Echo in 4/21 showed EF 25% with apical akinesis and diffuse hypokinesis relatively preserved in lateral wall, mildly decreased RV systolic  function, PASP 41 mmHg. CPX in 5/21 showed severe functional limitation suggestive of advanced HF. 5/21 Zio patch showed short atrial flutter runs, 3.8% PVCs.    He developed COVID-19 infection in 1/22.  Symptoms were predominantly GI.    He developed progressive worsening of CHF in early 2022 and was admitted in 3/22 with low output HF.  Impella 5.5 was placed as bridge to Heartmate III LVAD placed in 10/13/20.  He developed post-op renal failure and ended up on dialysis.  He was markedly deconditioned post-op and went to CIR.    Recently, patient has felt "dizzy" after HD.  He fell once last week.  He is not dizzy on non-HD days.  He is taking midodrine 5 mg tid on HD days. He has PI events during dialysis but rarely otherwise.  He can walk around his apartment and up 3 flights of stairs to get to his apartment with minimal dyspnea.  Right hand is still weak and numb.   No BRBPR/melena.   MAP stable today.   I reviewed the LVAD parameters from today, and compared the results to the patient's prior recorded data.  No programming changes were made.  The LVAD is functioning within specified parameters.  The patient performs LVAD self-test daily.  LVAD interrogation was negative for any significant power changes, alarms or PI events/speed drops.  LVAD equipment check completed and is in good working order.  Back-up equipment present.   LVAD education done on emergency procedures and precautions and reviewed exit site care.  Labs (5/22): LDH 174, hgb 10.6 Labs (7/22): LDH 164 Labs (8/22): hgb 11.4 Labs (10/22): hgb 12  LVAD Interrogation HM III: See LVAD coordinator's note above   Past Medical History:  Diagnosis Date   AICD (automatic cardioverter/defibrillator) present    Single-chamber  implantable cardiac defibrillator - Medtronic   Atrial fibrillation ([place])    Cataract    Mixed form OU   CHF (congestive heart failure) ([place])    Chronic kidney disease    Chronic kidney disease (CKD), stage III (moderate) (HCC)    Chronic systolic heart failure ([place])    a. Echo 5/13: Mild LVE, mild LVH, EF 10%, anteroseptal, lateral, apical AK, mild MR, mild LAE, moderately reduced RVSF, mild RAE, PASP 60;  b. 07/2014 Echo: EF 20-2%, diff HK, AKI of antsep/apical/mid-apicalinferior, mod reduced RV.   Coronary artery disease    a. s/p CABG 2002 b. LHC 5/13:  dLM 80%, LAD subtotally occluded, pCFX occluded, pRCA 50%, mid? Occlusion with high take off of the PDA with 70% multiple lesions-not bypassed and supplies collaterals to LAD, LIMA-IM/ramus ok, S-OM ok, S-PLA branches ok. Medical therapy was recommended   COVID    Diabetic retinopathy ([place])    NPDR OD, PDR OS   Dyspnea    Gout    "on daily RX" (01/08/2018)   Hypertension    Hypertensive retinopathy    OU   Ischemic cardiomyopathy    a. Echo 5/13: Mild LVE, mild LVH, EF 10%, anteroseptal, lateral, apical AK, mild  MR, mild LAE, moderately reduced RVSF, mild RAE, PASP 60;  b. 01/2012 s/p MDT [id number] Protecta XT VR AICD;  c. 07/2014 Echo: EF 20-2%, diff HK, AKI of antsep/apical/mid-apicalinferior, mod reduced RV.   MRSA (methicillin resistant Staphylococcus aureus)    Status post right foot plantar deep infection with MRSA status post  I&D 10/2008   Myocardial infarction [facility])    "was told I'd had several before heart OR in 2002" (01/08/2018)  Noncompliance    Peripheral neuropathy    Retinopathy, diabetic, background ([place])    Syncope    Type II diabetes mellitus ([place])    requiring insulin    Vitreous hemorrhage, left (HCC)    and proliferative diabetic retinopathy    Current Outpatient Medications  Medication Sig Dispense Refill   acetaminophen (TYLENOL) 325 MG tablet Take 1-2 tablets (325-650 mg total) by mouth every 4 (four) hours as needed for mild pain.     ascorbic acid (VITAMIN C) 1000 MG tablet Take 1 tablet (1,000 mg total) by mouth daily. 90 tablet 3   gabapentin (NEURONTIN) 100 MG capsule Take one tablet in am and 2 tablets in evening 90 capsule 11   insulin glargine (LANTUS) 100 UNIT/ML injection Inject 0.08 mLs (8 Units total) into the skin daily. Dx Code: E11.29 9 Pt is on sliding scale 10 mL 11   metoCLOPramide (REGLAN) 10 MG tablet Take 1 tablet (10 mg total) by mouth 3 (three) times daily before meals. 90 tablet 11   midodrine (PROAMATINE) 10 MG tablet Take 1 tablet (10 mg total) by mouth 3 (three) times daily. Three times a day on dialysis days; NO midodrine on NON dialysis days 90 tablet 6   multivitamin (RENA-VIT) TABS tablet Take 1 tablet by mouth at bedtime. 90 tablet 3   pantoprazole (PROTONIX) 40 MG tablet Take 1 tablet (40 mg total) by mouth daily. 90 tablet 3   polyethylene glycol powder (GLYCOLAX/MIRALAX) 17 GM/SCOOP powder Dissolve 1 capful in water and drink 2 (two) times daily. 510 g 0   rosuvastatin (CRESTOR) 10 MG tablet Take 1 tablet (10 mg total) by mouth daily. 30 tablet  11   senna-docusate (SENOKOT-S) 8.6-50 MG tablet Take 2 tablets by mouth 2 (two) times daily. 120 tablet 0   warfarin (COUMADIN) 3 MG tablet Take 4.5 mg (1.5 tabs) every Tuesday/Thursday/Saturday and 3 mg (1 tab) all other days or as directed by HF [facility] 180 tablet 3   LANTUS SOLOSTAR 100 UNIT/ML Solostar Pen INJECT 50 UNITS INTO THE SKIN AT BEDTIME. (Patient not taking: Reported on 06/27/2021) 3 mL 5   oxyCODONE (OXY IR/ROXICODONE) 5 MG immediate release tablet Take 1 tablet (5 mg total) by mouth every 6 (six) hours as needed for severe pain. (Patient not taking: Reported on 06/27/2021) 30 tablet 0   traMADol HCl 100 MG TABS Take 100 mg by mouth 2 (two) times daily as needed. (Patient not taking: Reported on 06/27/2021) 30 tablet 2   No current facility-administered medications for this encounter.    Meclizine hcl and Ivabradine  REVIEW OF SYSTEMS: All systems negative except as listed in HPI, PMH and Problem list.   LVAD INTERROGATION:  Please see LVAD nurse's note above.   I reviewed the LVAD parameters from today, and compared the results to the patient's prior recorded data.  No programming changes were made.  The LVAD is functioning within specified parameters.  The patient performs LVAD self-test daily.  LVAD interrogation was negative for any significant power changes, alarms or PI events/speed drops.  LVAD equipment check completed and is in good working order.  Back-up equipment present.   LVAD education done on emergency procedures and precautions and reviewed exit site care.   BP (!) 115/50 Comment: map 71  Pulse 90   Wt 71.8 kg (158 lb 6.4 oz)   SpO2 98%   BMI 22.73 kg/m  MAP 71  Physical Exam: General: Well appearing this am. NAD.  HEENT: Normal. Neck:  Supple, JVP 7-8 cm. Carotids OK.  Cardiac:  Mechanical heart sounds with LVAD hum present.  Lungs:  CTAB, normal effort.  Abdomen:  NT, ND, no HSM. No bruits or masses. +BS  LVAD exit site: Well-healed and  incorporated. Dressing dry and intact. No erythema or drainage. Stabilization device present and accurately applied. Driveline dressing changed daily per sterile technique. Extremities:  Warm and dry. No cyanosis, clubbing, rash, or edema.  Neuro:  Alert & oriented x 3. Cranial nerves grossly intact. Moves all 4 extremities w/o difficulty. Affect pleasant     ASSESSMENT AND PLAN:  1. Chronic systolic CHF:  Ischemic cardiomyopathy. Has Medtronic ICD. Low output HF, admitted and Impella 5.5 placed 10/04/20.  HM3 LVAD was placed on 10/13/20. Developed post-op renal failure requiring eventual hemodialysis.  MAP stable.  He is not volume overloaded on exam.  He has had orthostatic symptoms recently.  - Would allow dry weight to rise 2 lbs or so to limit orthostatic symptoms.  - Increase midodrine to 10 mg tid on HD days.   2. LVAD:  VAD interrogated personally. Parameters stable with rare PI events.  - He is now off ASA.  - Continue warfarin with INR goal 2-2.5.   3. CAD s/p CABG 2002:  Last cath in 6/19 with patent grafts. No chest pain.  - Continue Crestor.  4. ESRD: Currently dialyzing via tunneled catheter. No low flow events.  - Continue HD M-W-F  - With continuous flow LVAD, think AV fistula would be unlikely to mature.   5. Atrial flutter/fibrillation: s/p DC-CV 08/21/18.  NSR today.  6. Right hand pain/weakness: Likely neuropathic from stretch of brachial plexus with Impella placement.  - Continue gabapentin - Will get evaluation by neurology.  7. DM: Per primary care.  8. Anemia: Hgb stable on recent labs.   9. Suspected Gastroparesis. Nausea improved on Reglan.   Followup in 4-6 wks.   Loralie Champagne 06/27/2021

## 2021-06-27 NOTE — Patient Instructions (Addendum)
Increase Midodrine to 10 mg three times a day on dialysis days. Do not take on non-dialysis days. New prescription sent in to your pharmacy. We will increase your dry weight to 165 lbs.  Coumadin dosing per Ander Purpura PharmD We will refer you to [facility]rology for assessment of your right hand Return to [facility] in 6 weeks for follow up with Dr Aundra Dubin

## 2021-06-28 ENCOUNTER — Other Ambulatory Visit (HOSPITAL_COMMUNITY): Payer: Self-pay | Admitting: Cardiology

## 2021-06-28 DIAGNOSIS — N2581 Secondary hyperparathyroidism of renal origin: Secondary | ICD-10-CM | POA: Diagnosis not present

## 2021-06-28 DIAGNOSIS — N186 End stage renal disease: Secondary | ICD-10-CM | POA: Diagnosis not present

## 2021-06-28 DIAGNOSIS — Z992 Dependence on renal dialysis: Secondary | ICD-10-CM | POA: Diagnosis not present

## 2021-07-01 DIAGNOSIS — N186 End stage renal disease: Secondary | ICD-10-CM | POA: Diagnosis not present

## 2021-07-01 DIAGNOSIS — N2581 Secondary hyperparathyroidism of renal origin: Secondary | ICD-10-CM | POA: Diagnosis not present

## 2021-07-01 DIAGNOSIS — Z992 Dependence on renal dialysis: Secondary | ICD-10-CM | POA: Diagnosis not present

## 2021-07-03 ENCOUNTER — Telehealth (HOSPITAL_COMMUNITY): Payer: Self-pay | Admitting: *Deleted

## 2021-07-03 DIAGNOSIS — Z992 Dependence on renal dialysis: Secondary | ICD-10-CM | POA: Diagnosis not present

## 2021-07-03 DIAGNOSIS — N2581 Secondary hyperparathyroidism of renal origin: Secondary | ICD-10-CM | POA: Diagnosis not present

## 2021-07-03 DIAGNOSIS — N186 End stage renal disease: Secondary | ICD-10-CM | POA: Diagnosis not present

## 2021-07-03 NOTE — Telephone Encounter (Signed)
Caren Griffins from [facility] called re: patient dry weight. Recent orders from Dr. Aundra Dubin asked that patients' dry weight be increased to 75 kg based on recent dizziness/falls evaluated last clinic visit.   Pt presents to dialysis today with pre-weight of 72 kg. He denies any dizziness/falls. He is up 1.1 kg from last dialysis treatment Saturday, 07/01/21.   Dr. Aundra Dubin updated, will increase dry weight to 72 kg since patient feels good and is asymptomatic. Caren Griffins verbalized understanding of same.  She will call if any further issues arise.  Zada Girt, RN, [place] Coordinator [phone]

## 2021-07-04 ENCOUNTER — Ambulatory Visit (HOSPITAL_COMMUNITY): Payer: Self-pay | Admitting: Pharmacist

## 2021-07-04 DIAGNOSIS — Z7901 Long term (current) use of anticoagulants: Secondary | ICD-10-CM | POA: Diagnosis not present

## 2021-07-04 LAB — POCT INR: INR: 1.8 — AB (ref 2.0–3.0)

## 2021-07-04 NOTE — Progress Notes (Signed)
LVAD INR 

## 2021-07-05 DIAGNOSIS — Z992 Dependence on renal dialysis: Secondary | ICD-10-CM | POA: Diagnosis not present

## 2021-07-05 DIAGNOSIS — I509 Heart failure, unspecified: Secondary | ICD-10-CM | POA: Diagnosis not present

## 2021-07-05 DIAGNOSIS — N186 End stage renal disease: Secondary | ICD-10-CM | POA: Diagnosis not present

## 2021-07-05 DIAGNOSIS — N2581 Secondary hyperparathyroidism of renal origin: Secondary | ICD-10-CM | POA: Diagnosis not present

## 2021-07-06 ENCOUNTER — Encounter: Payer: Self-pay | Admitting: Neurology

## 2021-07-06 ENCOUNTER — Other Ambulatory Visit: Payer: Self-pay

## 2021-07-06 ENCOUNTER — Other Ambulatory Visit (HOSPITAL_COMMUNITY): Payer: Self-pay | Admitting: *Deleted

## 2021-07-06 DIAGNOSIS — Z95811 Presence of heart assist device: Secondary | ICD-10-CM

## 2021-07-06 DIAGNOSIS — R202 Paresthesia of skin: Secondary | ICD-10-CM

## 2021-07-07 DIAGNOSIS — Z992 Dependence on renal dialysis: Secondary | ICD-10-CM | POA: Diagnosis not present

## 2021-07-07 DIAGNOSIS — N186 End stage renal disease: Secondary | ICD-10-CM | POA: Diagnosis not present

## 2021-07-07 DIAGNOSIS — N2581 Secondary hyperparathyroidism of renal origin: Secondary | ICD-10-CM | POA: Diagnosis not present

## 2021-07-10 DIAGNOSIS — Z992 Dependence on renal dialysis: Secondary | ICD-10-CM | POA: Diagnosis not present

## 2021-07-10 DIAGNOSIS — N2581 Secondary hyperparathyroidism of renal origin: Secondary | ICD-10-CM | POA: Diagnosis not present

## 2021-07-10 DIAGNOSIS — N186 End stage renal disease: Secondary | ICD-10-CM | POA: Diagnosis not present

## 2021-07-11 ENCOUNTER — Ambulatory Visit (HOSPITAL_COMMUNITY): Payer: Self-pay | Admitting: Pharmacist

## 2021-07-11 ENCOUNTER — Ambulatory Visit (INDEPENDENT_AMBULATORY_CARE_PROVIDER_SITE_OTHER): Payer: Medicare HMO

## 2021-07-11 ENCOUNTER — Other Ambulatory Visit: Payer: Self-pay

## 2021-07-11 DIAGNOSIS — I5022 Chronic systolic (congestive) heart failure: Secondary | ICD-10-CM

## 2021-07-11 DIAGNOSIS — Z950 Presence of cardiac pacemaker: Secondary | ICD-10-CM | POA: Diagnosis not present

## 2021-07-11 LAB — POCT INR: INR: 1.8 — AB (ref 2.0–3.0)

## 2021-07-11 NOTE — Progress Notes (Signed)
LVAD INR 

## 2021-07-12 DIAGNOSIS — Z992 Dependence on renal dialysis: Secondary | ICD-10-CM | POA: Diagnosis not present

## 2021-07-12 DIAGNOSIS — N186 End stage renal disease: Secondary | ICD-10-CM | POA: Diagnosis not present

## 2021-07-12 DIAGNOSIS — N2581 Secondary hyperparathyroidism of renal origin: Secondary | ICD-10-CM | POA: Diagnosis not present

## 2021-07-12 LAB — CUP PACEART REMOTE DEVICE CHECK
Battery Remaining Longevity: 132 mo
Battery Voltage: 3.03 V
Brady Statistic RV Percent Paced: 0 %
Date Time Interrogation Session: 20221207123553
HighPow Impedance: 27 Ohm
HighPow Impedance: 34 Ohm
Implantable Lead Implant Date: 20130603
Implantable Lead Location: 753860
Implantable Lead Model: 6947
Implantable Pulse Generator Implant Date: 20211129
Lead Channel Impedance Value: 285 Ohm
Lead Channel Impedance Value: 532 Ohm
Lead Channel Pacing Threshold Amplitude: 1 V
Lead Channel Pacing Threshold Pulse Width: 0.4 ms
Lead Channel Sensing Intrinsic Amplitude: 3.625 mV
Lead Channel Sensing Intrinsic Amplitude: 3.625 mV
Lead Channel Setting Pacing Amplitude: 2.25 V
Lead Channel Setting Pacing Pulse Width: 0.4 ms
Lead Channel Setting Sensing Sensitivity: 0.3 mV

## 2021-07-13 ENCOUNTER — Telehealth: Payer: Self-pay | Admitting: Family Medicine

## 2021-07-13 NOTE — Telephone Encounter (Signed)
Left message for patient to call back and schedule Medicare Annual Wellness Visit (AWV) either virtually or in office. Left  my Herbie Drape number [phone]  awvi 08/06/20 per [facility]  please schedule at anytime with [facility] Nurse Health Advisor 1 or 2   This should be a 45 minute visit.

## 2021-07-14 DIAGNOSIS — Z992 Dependence on renal dialysis: Secondary | ICD-10-CM | POA: Diagnosis not present

## 2021-07-14 DIAGNOSIS — N186 End stage renal disease: Secondary | ICD-10-CM | POA: Diagnosis not present

## 2021-07-14 DIAGNOSIS — N2581 Secondary hyperparathyroidism of renal origin: Secondary | ICD-10-CM | POA: Diagnosis not present

## 2021-07-17 DIAGNOSIS — N186 End stage renal disease: Secondary | ICD-10-CM | POA: Diagnosis not present

## 2021-07-17 DIAGNOSIS — N2581 Secondary hyperparathyroidism of renal origin: Secondary | ICD-10-CM | POA: Diagnosis not present

## 2021-07-17 DIAGNOSIS — Z992 Dependence on renal dialysis: Secondary | ICD-10-CM | POA: Diagnosis not present

## 2021-07-18 ENCOUNTER — Ambulatory Visit (HOSPITAL_COMMUNITY): Payer: Self-pay | Admitting: Pharmacist

## 2021-07-18 ENCOUNTER — Ambulatory Visit (INDEPENDENT_AMBULATORY_CARE_PROVIDER_SITE_OTHER): Payer: Medicare HMO

## 2021-07-18 VITALS — Ht 70.5 in | Wt 157.0 lb

## 2021-07-18 DIAGNOSIS — Z0000 Encounter for general adult medical examination without abnormal findings: Secondary | ICD-10-CM

## 2021-07-18 LAB — POCT INR: INR: 1.8 — AB (ref 2.0–3.0)

## 2021-07-18 NOTE — Progress Notes (Signed)
LVAD INR 

## 2021-07-18 NOTE — Patient Instructions (Signed)
[name] , Thank you for taking time to come for your Medicare Wellness Visit. I appreciate your ongoing commitment to your health goals. Please review the following plan we discussed and let me know if I can assist you in the future.   Screening recommendations/referrals: Colonoscopy: decline at this time Recommended yearly ophthalmology/optometry visit for glaucoma screening and checkup Recommended yearly dental visit for hygiene and checkup  Vaccinations: Influenza vaccine: completed 05/17/2021 Pneumococcal vaccine: due Tdap vaccine: due Shingles vaccine: discussed   Covid-19:  11/23/2019, 10/29/2019  Advanced directives: copy in chart  Conditions/risks identified: none  Next appointment: Follow up in one year for your annual wellness visit   Preventive Care 40-64 Years, Male Preventive care refers to lifestyle choices and visits with your health care provider that can promote health and wellness. What does preventive care include? A yearly physical exam. This is also called an annual well check. Dental exams once or twice a year. Routine eye exams. Ask your health care provider how often you should have your eyes checked. Personal lifestyle choices, including: Daily care of your teeth and gums. Regular physical activity. Eating a healthy diet. Avoiding tobacco and drug use. Limiting alcohol use. Practicing safe sex. Taking low-dose aspirin every day starting at age 63. What happens during an annual well check? The services and screenings done by your health care provider during your annual well check will depend on your age, overall health, lifestyle risk factors, and family history of disease. Counseling  Your health care provider may ask you questions about your: Alcohol use. Tobacco use. Drug use. Emotional well-being. Home and relationship well-being. Sexual activity. Eating habits. Work and work Statistician. Screening  You may have the following tests or  measurements: Height, weight, and BMI. Blood pressure. Lipid and cholesterol levels. These may be checked every 5 years, or more frequently if you are over 37 years old. Skin check. Lung cancer screening. You may have this screening every year starting at age 11 if you have a 30-pack-year history of smoking and currently smoke or have quit within the past 15 years. Fecal occult blood test (FOBT) of the stool. You may have this test every year starting at age 35. Flexible sigmoidoscopy or colonoscopy. You may have a sigmoidoscopy every 5 years or a colonoscopy every 10 years starting at age 69. Prostate cancer screening. Recommendations will vary depending on your family history and other risks. Hepatitis C blood test. Hepatitis B blood test. Sexually transmitted disease (STD) testing. Diabetes screening. This is done by checking your blood sugar (glucose) after you have not eaten for a while (fasting). You may have this done every 1-3 years. Discuss your test results, treatment options, and if necessary, the need for more tests with your health care provider. Vaccines  Your health care provider may recommend certain vaccines, such as: Influenza vaccine. This is recommended every year. Tetanus, diphtheria, and acellular pertussis (Tdap, Td) vaccine. You may need a Td booster every 10 years. Zoster vaccine. You may need this after age 68. Pneumococcal 13-valent conjugate (PCV13) vaccine. You may need this if you have certain conditions and have not been vaccinated. Pneumococcal polysaccharide (PPSV23) vaccine. You may need one or two doses if you smoke cigarettes or if you have certain conditions. Talk to your health care provider about which screenings and vaccines you need and how often you need them. This information is not intended to replace advice given to you by your health care provider. Make sure you discuss any questions  you have with your health care provider. Document Released:  08/19/2015 Document Revised: 04/11/2016 Document Reviewed: 05/24/2015 Elsevier Interactive Patient Education  2017 [place] Prevention in the Home Falls can cause injuries. They can happen to people of all ages. There are many things you can do to make your home safe and to help prevent falls. What can I do on the outside of my home? Regularly fix the edges of walkways and driveways and fix any cracks. Remove anything that might make you trip as you walk through a door, such as a raised step or threshold. Trim any bushes or trees on the path to your home. Use bright outdoor lighting. Clear any walking paths of anything that might make someone trip, such as rocks or tools. Regularly check to see if handrails are loose or broken. Make sure that both sides of any steps have handrails. Any raised decks and porches should have guardrails on the edges. Have any leaves, snow, or ice cleared regularly. Use sand or salt on walking paths during winter. Clean up any spills in your garage right away. This includes oil or grease spills. What can I do in the bathroom? Use night lights. Install grab bars by the toilet and in the tub and shower. Do not use towel bars as grab bars. Use non-skid mats or decals in the tub or shower. If you need to sit down in the shower, use a plastic, non-slip stool. Keep the floor dry. Clean up any water that spills on the floor as soon as it happens. Remove soap buildup in the tub or shower regularly. Attach bath mats securely with double-sided non-slip rug tape. Do not have throw rugs and other things on the floor that can make you trip. What can I do in the bedroom? Use night lights. Make sure that you have a light by your bed that is easy to reach. Do not use any sheets or blankets that are too big for your bed. They should not hang down onto the floor. Have a firm chair that has side arms. You can use this for support while you get dressed. Do not have  throw rugs and other things on the floor that can make you trip. What can I do in the kitchen? Clean up any spills right away. Avoid walking on wet floors. Keep items that you use a lot in easy-to-reach places. If you need to reach something above you, use a strong step stool that has a grab bar. Keep electrical cords out of the way. Do not use floor polish or wax that makes floors slippery. If you must use wax, use non-skid floor wax. Do not have throw rugs and other things on the floor that can make you trip. What can I do with my stairs? Do not leave any items on the stairs. Make sure that there are handrails on both sides of the stairs and use them. Fix handrails that are broken or loose. Make sure that handrails are as long as the stairways. Check any carpeting to make sure that it is firmly attached to the stairs. Fix any carpet that is loose or worn. Avoid having throw rugs at the top or bottom of the stairs. If you do have throw rugs, attach them to the floor with carpet tape. Make sure that you have a light switch at the top of the stairs and the bottom of the stairs. If you do not have them, ask someone to add them for  you. What else can I do to help prevent falls? Wear shoes that: Do not have high heels. Have rubber bottoms. Are comfortable and fit you well. Are closed at the toe. Do not wear sandals. If you use a stepladder: Make sure that it is fully opened. Do not climb a closed stepladder. Make sure that both sides of the stepladder are locked into place. Ask someone to hold it for you, if possible. Clearly mark and make sure that you can see: Any grab bars or handrails. First and last steps. Where the edge of each step is. Use tools that help you move around (mobility aids) if they are needed. These include: Canes. Walkers. Scooters. Crutches. Turn on the lights when you go into a dark area. Replace any light bulbs as soon as they burn out. Set up your furniture so  you have a clear path. Avoid moving your furniture around. If any of your floors are uneven, fix them. If there are any pets around you, be aware of where they are. Review your medicines with your doctor. Some medicines can make you feel dizzy. This can increase your chance of falling. Ask your doctor what other things that you can do to help prevent falls. This information is not intended to replace advice given to you by your health care provider. Make sure you discuss any questions you have with your health care provider. Document Released: 05/19/2009 Document Revised: 12/29/2015 Document Reviewed: 08/27/2014 Elsevier Interactive Patient Education  2017 [employer].
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
## 2021-07-19 DIAGNOSIS — N186 End stage renal disease: Secondary | ICD-10-CM | POA: Diagnosis not present

## 2021-07-19 DIAGNOSIS — Z992 Dependence on renal dialysis: Secondary | ICD-10-CM | POA: Diagnosis not present

## 2021-07-19 DIAGNOSIS — N2581 Secondary hyperparathyroidism of renal origin: Secondary | ICD-10-CM | POA: Diagnosis not present

## 2021-07-20 NOTE — Progress Notes (Signed)
Remote ICD transmission.   

## 2021-07-21 DIAGNOSIS — N186 End stage renal disease: Secondary | ICD-10-CM | POA: Diagnosis not present

## 2021-07-21 DIAGNOSIS — N2581 Secondary hyperparathyroidism of renal origin: Secondary | ICD-10-CM | POA: Diagnosis not present

## 2021-07-21 DIAGNOSIS — Z992 Dependence on renal dialysis: Secondary | ICD-10-CM | POA: Diagnosis not present

## 2021-07-24 DIAGNOSIS — Z992 Dependence on renal dialysis: Secondary | ICD-10-CM | POA: Diagnosis not present

## 2021-07-24 DIAGNOSIS — N2581 Secondary hyperparathyroidism of renal origin: Secondary | ICD-10-CM | POA: Diagnosis not present

## 2021-07-24 DIAGNOSIS — N186 End stage renal disease: Secondary | ICD-10-CM | POA: Diagnosis not present

## 2021-07-25 ENCOUNTER — Ambulatory Visit (HOSPITAL_COMMUNITY): Payer: Self-pay | Admitting: Pharmacist

## 2021-07-25 LAB — POCT INR: INR: 2 (ref 2.0–3.0)

## 2021-07-25 NOTE — Progress Notes (Signed)
LVAD INR 

## 2021-07-26 DIAGNOSIS — Z992 Dependence on renal dialysis: Secondary | ICD-10-CM | POA: Diagnosis not present

## 2021-07-26 DIAGNOSIS — N186 End stage renal disease: Secondary | ICD-10-CM | POA: Diagnosis not present

## 2021-07-26 DIAGNOSIS — N2581 Secondary hyperparathyroidism of renal origin: Secondary | ICD-10-CM | POA: Diagnosis not present

## 2021-07-28 DIAGNOSIS — N2581 Secondary hyperparathyroidism of renal origin: Secondary | ICD-10-CM | POA: Diagnosis not present

## 2021-07-28 DIAGNOSIS — N186 End stage renal disease: Secondary | ICD-10-CM | POA: Diagnosis not present

## 2021-07-28 DIAGNOSIS — Z992 Dependence on renal dialysis: Secondary | ICD-10-CM | POA: Diagnosis not present

## 2021-07-31 DIAGNOSIS — N186 End stage renal disease: Secondary | ICD-10-CM | POA: Diagnosis not present

## 2021-07-31 DIAGNOSIS — Z992 Dependence on renal dialysis: Secondary | ICD-10-CM | POA: Diagnosis not present

## 2021-07-31 DIAGNOSIS — N2581 Secondary hyperparathyroidism of renal origin: Secondary | ICD-10-CM | POA: Diagnosis not present

## 2021-08-01 ENCOUNTER — Ambulatory Visit (HOSPITAL_COMMUNITY): Payer: Self-pay | Admitting: Pharmacist

## 2021-08-01 DIAGNOSIS — Z7901 Long term (current) use of anticoagulants: Secondary | ICD-10-CM | POA: Diagnosis not present

## 2021-08-01 LAB — POCT INR: INR: 1.7 — AB (ref 2.0–3.0)

## 2021-08-01 NOTE — Progress Notes (Signed)
LVAD INR 

## 2021-08-02 DIAGNOSIS — N186 End stage renal disease: Secondary | ICD-10-CM | POA: Diagnosis not present

## 2021-08-02 DIAGNOSIS — N2581 Secondary hyperparathyroidism of renal origin: Secondary | ICD-10-CM | POA: Diagnosis not present

## 2021-08-02 DIAGNOSIS — Z992 Dependence on renal dialysis: Secondary | ICD-10-CM | POA: Diagnosis not present

## 2021-08-04 DIAGNOSIS — N186 End stage renal disease: Secondary | ICD-10-CM | POA: Diagnosis not present

## 2021-08-04 DIAGNOSIS — N2581 Secondary hyperparathyroidism of renal origin: Secondary | ICD-10-CM | POA: Diagnosis not present

## 2021-08-04 DIAGNOSIS — Z992 Dependence on renal dialysis: Secondary | ICD-10-CM | POA: Diagnosis not present

## 2021-08-05 DIAGNOSIS — Z992 Dependence on renal dialysis: Secondary | ICD-10-CM | POA: Diagnosis not present

## 2021-08-05 DIAGNOSIS — N186 End stage renal disease: Secondary | ICD-10-CM | POA: Diagnosis not present

## 2021-08-05 DIAGNOSIS — I509 Heart failure, unspecified: Secondary | ICD-10-CM | POA: Diagnosis not present

## 2021-08-07 DIAGNOSIS — Z992 Dependence on renal dialysis: Secondary | ICD-10-CM | POA: Diagnosis not present

## 2021-08-07 DIAGNOSIS — N2581 Secondary hyperparathyroidism of renal origin: Secondary | ICD-10-CM | POA: Diagnosis not present

## 2021-08-07 DIAGNOSIS — N186 End stage renal disease: Secondary | ICD-10-CM | POA: Diagnosis not present

## 2021-08-08 ENCOUNTER — Ambulatory Visit (HOSPITAL_COMMUNITY): Payer: Self-pay | Admitting: Pharmacist

## 2021-08-08 ENCOUNTER — Other Ambulatory Visit: Payer: Self-pay

## 2021-08-08 ENCOUNTER — Ambulatory Visit (INDEPENDENT_AMBULATORY_CARE_PROVIDER_SITE_OTHER): Payer: 59 | Admitting: Neurology

## 2021-08-08 DIAGNOSIS — G629 Polyneuropathy, unspecified: Secondary | ICD-10-CM

## 2021-08-08 DIAGNOSIS — R202 Paresthesia of skin: Secondary | ICD-10-CM | POA: Diagnosis not present

## 2021-08-08 LAB — POCT INR: INR: 1.9 — AB (ref 2.0–3.0)

## 2021-08-08 NOTE — Progress Notes (Signed)
LVAD INR 

## 2021-08-08 NOTE — Procedures (Signed)
[facility]  [place], [place],  [address] Tel: [phone] Fax:  [phone] Test Date:  08/08/2021  Patient: Marcus Johnson, Marcus Johnson DOB: 1961-03-25 Physician: Narda Amber, DO  Sex: Male Height: 5\' 10"  Ref Phys: Loralie Champagne, MD  ID#: [id number]   Technician:    Patient Complaints: This is a 61 year old man with diabetes mellitus type 2, end-stage renal disease on dialysis, and congestive heart failure s/p LVAD referred for evaluation of right hand pain and paresthesias.  NCV & EMG Findings: Extensive electrodiagnostic testing of the right upper extremity shows:  All sensory responses including the right median, ulnar, radial, medial antebrachial, and lateral antebrachial cutaneous sensory responses are absent. Right median motor response is absent.  Right ulnar motor response shows prolonged distal onset latency (4.5 ms), reduced amplitude (1.9 mV), decreased conduction velocity (B Elbow-Wrist, 45 m/s), and decreased conduction velocity (A Elbow-B Elbow, 40 m/s).   Chronic motor axonal loss changes are seen affecting the first dorsal interosseous, extensor indicis proprius, flexor carpi ulnaris, and pronator teres muscles.  Despite maximal activation, no motor unit recruitment is seen in the right abductor pollicis brevis.  There is no evidence of accompanied active denervation.  Impression: The electrophysiologic findings are consistent with a severe and chronic sensorimotor predominantly axonal polyneuropathy affecting the right upper extremity.     ___________________________ Narda Amber, DO    Nerve Conduction Studies Anti Sensory Summary Table   Stim Site NR Peak (ms) Norm Peak (ms) P-T Amp (V) Norm P-T Amp  Right Lat Ante Brach [place] Anti Sensory (Lat Forearm)  32C  Lat Biceps NR  <2.8  >10  Right Med Ante Brach [place] Anti Sensory (Med Forearm)  32C  Elbow NR      Right Median Anti Sensory (2nd Digit)  32C  Wrist NR  <3.8  >10  Right  Radial Anti Sensory (Base 1st Digit)  32C  Wrist NR  <2.8  >10  Right Ulnar Anti Sensory (5th Digit)  32C  Wrist NR  <3.2  >5   Motor Summary Table   Stim Site NR Onset (ms) Norm Onset (ms) O-P Amp (mV) Norm O-P Amp Site1 Site2 Delta-0 (ms) Dist (cm) Vel (m/s) Norm Vel (m/s)  Right Median Motor (Abd Poll Brev)  32C  Wrist NR  <4.0  >5 Elbow Wrist  0.0  >50  Elbow NR            Right Ulnar Motor (Abd Dig Minimi)  32C  Wrist    4.5 <3.1 1.9 >7 B Elbow Wrist 6.0 27.0 45 >50  B Elbow    10.5  1.4  A Elbow B Elbow 2.5 10.0 40 >50  A Elbow    13.0  1.2          EMG   Side Muscle Ins Act Fibs Psw Fasc Number Recrt Dur Dur. Amp Amp. Poly Poly. Comment  Right Abd Poll Brev Nml Nml Nml Nml NE None - - - - - - ATR  Right 1stDorInt Nml Nml Nml Nml SMU Rapid All 1+ All 1+ All 1+ ATR  Right Ext Indicis Nml Nml Nml Nml 3- Rapid All 1+ All 1+ All 1+ ATR  Right Triceps Nml Nml Nml Nml Nml Nml Nml Nml Nml Nml Nml Nml N/A  Right Biceps Nml Nml Nml Nml Nml Nml Nml Nml Nml Nml Nml Nml N/A  Right Deltoid Nml Nml Nml Nml Nml Nml Nml Nml Nml Nml Nml Nml N/A  Right  FlexCarpiUln Nml Nml Nml Nml 2- Rapid Some 1+ Some 1+ Some 1+ N/A  Right PronatorTeres Nml Nml Nml Nml 2- Rapid Some 1+ Some 1+ Some 1+ N/A      Waveforms:

## 2021-08-09 DIAGNOSIS — N186 End stage renal disease: Secondary | ICD-10-CM | POA: Diagnosis not present

## 2021-08-09 DIAGNOSIS — N2581 Secondary hyperparathyroidism of renal origin: Secondary | ICD-10-CM | POA: Diagnosis not present

## 2021-08-09 DIAGNOSIS — Z992 Dependence on renal dialysis: Secondary | ICD-10-CM | POA: Diagnosis not present

## 2021-08-10 ENCOUNTER — Other Ambulatory Visit: Payer: Self-pay

## 2021-08-10 ENCOUNTER — Other Ambulatory Visit (HOSPITAL_COMMUNITY): Payer: Self-pay | Admitting: *Deleted

## 2021-08-10 ENCOUNTER — Encounter (HOSPITAL_COMMUNITY): Payer: Self-pay

## 2021-08-10 ENCOUNTER — Ambulatory Visit (HOSPITAL_COMMUNITY)
Admission: RE | Admit: 2021-08-10 | Discharge: 2021-08-10 | Disposition: A | Discharge status: Home / Self Care | Payer: Medicare HMO | Source: Ambulatory Visit | Attending: Cardiology | Admitting: Cardiology

## 2021-08-10 VITALS — BP 132/96 | HR 74 | Temp 98.3°F | Ht 70.0 in | Wt 161.2 lb

## 2021-08-10 DIAGNOSIS — D649 Anemia, unspecified: Secondary | ICD-10-CM | POA: Diagnosis not present

## 2021-08-10 DIAGNOSIS — I4891 Unspecified atrial fibrillation: Secondary | ICD-10-CM | POA: Diagnosis not present

## 2021-08-10 DIAGNOSIS — Z951 Presence of aortocoronary bypass graft: Secondary | ICD-10-CM | POA: Diagnosis not present

## 2021-08-10 DIAGNOSIS — I5022 Chronic systolic (congestive) heart failure: Secondary | ICD-10-CM | POA: Insufficient documentation

## 2021-08-10 DIAGNOSIS — Z95811 Presence of heart assist device: Secondary | ICD-10-CM | POA: Diagnosis not present

## 2021-08-10 DIAGNOSIS — E11319 Type 2 diabetes mellitus with unspecified diabetic retinopathy without macular edema: Secondary | ICD-10-CM | POA: Insufficient documentation

## 2021-08-10 DIAGNOSIS — M79641 Pain in right hand: Secondary | ICD-10-CM | POA: Insufficient documentation

## 2021-08-10 DIAGNOSIS — E1122 Type 2 diabetes mellitus with diabetic chronic kidney disease: Secondary | ICD-10-CM | POA: Diagnosis not present

## 2021-08-10 DIAGNOSIS — Z95810 Presence of automatic (implantable) cardiac defibrillator: Secondary | ICD-10-CM | POA: Insufficient documentation

## 2021-08-10 DIAGNOSIS — I255 Ischemic cardiomyopathy: Secondary | ICD-10-CM | POA: Insufficient documentation

## 2021-08-10 DIAGNOSIS — G629 Polyneuropathy, unspecified: Secondary | ICD-10-CM | POA: Diagnosis not present

## 2021-08-10 DIAGNOSIS — E1142 Type 2 diabetes mellitus with diabetic polyneuropathy: Secondary | ICD-10-CM | POA: Insufficient documentation

## 2021-08-10 DIAGNOSIS — I132 Hypertensive heart and chronic kidney disease with heart failure and with stage 5 chronic kidney disease, or end stage renal disease: Secondary | ICD-10-CM | POA: Insufficient documentation

## 2021-08-10 DIAGNOSIS — Z992 Dependence on renal dialysis: Secondary | ICD-10-CM | POA: Insufficient documentation

## 2021-08-10 DIAGNOSIS — Z8616 Personal history of COVID-19: Secondary | ICD-10-CM | POA: Insufficient documentation

## 2021-08-10 DIAGNOSIS — Z7901 Long term (current) use of anticoagulants: Secondary | ICD-10-CM | POA: Diagnosis not present

## 2021-08-10 DIAGNOSIS — E1136 Type 2 diabetes mellitus with diabetic cataract: Secondary | ICD-10-CM | POA: Insufficient documentation

## 2021-08-10 DIAGNOSIS — I2510 Atherosclerotic heart disease of native coronary artery without angina pectoris: Secondary | ICD-10-CM | POA: Insufficient documentation

## 2021-08-10 DIAGNOSIS — I4892 Unspecified atrial flutter: Secondary | ICD-10-CM | POA: Diagnosis not present

## 2021-08-10 DIAGNOSIS — N186 End stage renal disease: Secondary | ICD-10-CM | POA: Diagnosis not present

## 2021-08-10 DIAGNOSIS — Z79899 Other long term (current) drug therapy: Secondary | ICD-10-CM | POA: Insufficient documentation

## 2021-08-10 MED ORDER — GABAPENTIN 100 MG PO CAPS: ORAL_CAPSULE | ORAL | 11 refills | Status: DC

## 2021-08-10 NOTE — Progress Notes (Addendum)
Patient presents for 2 mo f/u in [facility] today with brother Marcus Johnson). Reports no problems with VAD equipment or concerns with drive line.  Patient arrived in w/c but reports he is walking at home without difficulty. He is going up and down 3 flights of stairs without SOB and has started walking to mailbox and back (about 1/4 mile) without symptoms.    Patient reports he continues M/W/F dialysis and that is dry weight is now 72 kg. Pt confirms he is taking midodrine 10 mg TID on HD days only. Reports occasional lightheadedness/dizziness after dialysis treatments, but denies falls. Pt says he has not been eating before dialysis and feels his BS "may be dropping". He plans to start eating a meal prior to dialysis and/or taking a snack with him.    Pt reports he has reduced gabapentin dose to 100 mg twice daily. He says he thinks it might be causing dizziness. Discussed with Dr. Aundra Dubin - he may take only if needed for pain.   Pt saw Dr. Posey Pronto (Neurology) and had nerve conduction studies done on right hand. Discussed findings with Dr. Aundra Dubin.    Pt reports daily home blood sugar testing reveal  80 - 85. He is no longer requiring PM insulin. He will continue to monitor blood sugars. Encouraged him to have continued follow up with his PCP for diabetes management as needed.   Labs drawn weekly at dialysis. Results from 07/19/21 reviewed by Dr. Aundra Dubin. Order faxed to [facility] asking them to add monthly LDH's.   Pt asked Dr. Aundra Dubin to look at "dark spots" on right upper arm. Pt will see Dermatology for evaluation of same.   Vital Signs:  Temp:  98.3 Doppler Pressure: 104 Automatc BP: 132/75 (96) 116/68 (95) HR:  74 O2: 100% RA   Weight: 161.2 lb w/ eqt Last wt: 158.4 lb    VAD Indication: Destination Therapy due to CKD Stage 3b   LVAD assessment:  HM III:: VAD Speed: 5200 rpms Flow:  4.1 Power: 3.8w    PI: 4.6 Alarms: none Events: 0 - 5 PI events daily Hct: 32  Fixed speed:   5200 Low speed limit:  4900  Primary controller: back up battery due for replacement in 21 months Secondary controller:  back up battery due for replacement in 25 months   I reviewed the LVAD parameters from today and compared the results to the patient's prior recorded data. LVAD interrogation was NEGATIVE for significant power changes, NEGATIVE for clinical alarms and STABLE for PI events/speed drops. No programming changes were made and pump is functioning within specified parameters. Pt is performing daily controller and system monitor self tests along with completing weekly and monthly maintenance for LVAD equipment.   LVAD equipment check completed and is in good working order. Back-up equipment present.    Annual Equipment Maintenance on UBC/PM was performed 3/22.  Exit Site Care: VAD dressing and anchor removed and site care performed using sterile technique. Drive line exit site cleaned with Chlora prep applicators x 2, allowed to dry before Sorbaview dressing with silverlon patch applied. Exit site incorporated. The velour is fully implanted at exit site. No redness or tenderness noted. Drive line anchor re-applied. Pt denies fever or chills. Driveline dressing is being changed once weekly per sterile technique by son Marcus Johnson).  Provided with 8 weekly dressing kits and XL sterile gloves for caregiver.    Device: Medtronic single Therapies: on 240 bpm Pacing: VVI 40 Last check:  07/12/21  BP & Labs:  Doppler 104 - Doppler is reflecting modified systolic   Hgb 67.6 - No S/S of bleeding. Specifically denies melena/BRBPR or nosebleeds.   LDH  not done - established baseline of 160 - 180. Denies tea-colored urine. No power elevations noted on interrogation.    Patient Instructions: May take gabapentin as needed only. Will ask Dialysis center to add LDH to labs per Dr. Aundra Dubin. Return to [facility] in 2 months. Will be your 1 yr Intermacs f/u - be prepared to do 6 minute walk. Bring  your home equipment in for annual maintenance.  Ramp echo to be performed at next clinic visit.   Zada Girt RN VAD Coordinator  Office: [phone]  24/7 Pager: [phone]    Follow up for Heart Failure/LVAD:  Marcus Johnson. is a 61 y.o. male who has a h/o DM2, CAD s/p CABG 8250, systolic HF due to ischemic cardiomyopathy with EF 20-25% (echo 12/15), DM2 and CKD. He is s/p Medtronic single chamber ICD.   Admitted in 12/15 due to ADHF. Required short course milrinone for diuresis. Diuresed 30 pounds.    CPX 2/16 showed severely reduced functional capacity.  There was a significant disconnect between his symptoms and the CPX.  RHC in 6/16 showed fairly normal filling pressures and low but not markedly low cardiac output.    CPX (4/19) was submaximal but suggested severe limitation from HF.    He was admitted in 6/19 with marked volume overload and dyspnea.  Echo in 6/19 showed EF 20% with severe global hypokinesis.  LHC/RHC showed severe native vessel CAD with patent LIMA-D, SVG-OM, and SVG-PLV; cardiac index low.  He was started on milrinone and diuresed.  We discussed LVAD extensively in light of low output, creatinine that is trending up and concern for decompensation of RV over time. He was adamant that he did not want to pursue LVAD workup yet and did not want a referral for transplant evaluation. Milrinone was weaned off and he was discharged home.    Admitted 08/01/18-08/07/18 with volume overload and left foot wound. Diuresed 38 lbs with IV lasix and then transitioned back to torsemide 100 mg BID. ID was consulted with concerns for left foot osteomyelitis noted on CT scan. He got IV ancef q8 hours x 42 days. [facility] provided teaching and supplies for home antibiotics. Course complicated by AKI. DC weight 200 lbs.    He went into atrial flutter and had DCCV to NSR in 1/20.    Echo in 4/21 showed EF 25% with apical akinesis and diffuse hypokinesis relatively preserved in lateral  wall, mildly decreased RV systolic function, PASP 41 mmHg. CPX in 5/21 showed severe functional limitation suggestive of advanced HF. 5/21 Zio patch showed short atrial flutter runs, 3.8% PVCs.    He developed COVID-19 infection in 1/22.  Symptoms were predominantly GI.    He developed progressive worsening of CHF in early 2022 and was admitted in 3/22 with low output HF.  Impella 5.5 was placed as bridge to Heartmate III LVAD placed in 10/13/20.  He developed post-op renal failure and ended up on dialysis.  He was markedly deconditioned post-op and went to CIR.    Patient returns for LVAD followup.  He is doing well generally.  Still has pain and minimal function in his right hand.  Nerve conduction study showed a chronic sensorimotor neuropathy in the right arm.  Weight is up about 3 lbs. No dyspnea walking on flat ground but eventually tires.  He can walk up 3 flights of stairs to his apartment.  He can walk 1/4 mile to the mailbox. Device interrogation today showed 3.8% atrial fibrillation.   I reviewed the LVAD parameters from today, and compared the results to the patient's prior recorded data.  No programming changes were made.  The LVAD is functioning within specified parameters.  The patient performs LVAD self-test daily.  LVAD interrogation was negative for any significant power changes, alarms or PI events/speed drops.  LVAD equipment check completed and is in good working order.  Back-up equipment present.   LVAD education done on emergency procedures and precautions and reviewed exit site care.  Labs (5/22): LDH 174, hgb 10.6 Labs (7/22): LDH 164 Labs (8/22): hgb 11.4 Labs (10/22): hgb 12 Labs (11/22): hgb 12.2   Past Medical History:  Diagnosis Date   AICD (automatic cardioverter/defibrillator) present    Single-chamber  implantable cardiac defibrillator - Medtronic   Atrial fibrillation ([place])    Cataract    Mixed form OU   CHF (congestive heart failure) ([place])    Chronic kidney  disease    Chronic kidney disease (CKD), stage III (moderate) (HCC)    Chronic systolic heart failure ([place])    a. Echo 5/13: Mild LVE, mild LVH, EF 10%, anteroseptal, lateral, apical AK, mild MR, mild LAE, moderately reduced RVSF, mild RAE, PASP 60;  b. 07/2014 Echo: EF 20-2%, diff HK, AKI of antsep/apical/mid-apicalinferior, mod reduced RV.   Coronary artery disease    a. s/p CABG 2002 b. LHC 5/13:  dLM 80%, LAD subtotally occluded, pCFX occluded, pRCA 50%, mid? Occlusion with high take off of the PDA with 70% multiple lesions-not bypassed and supplies collaterals to LAD, LIMA-IM/ramus ok, S-OM ok, S-PLA branches ok. Medical therapy was recommended   COVID    Diabetic retinopathy ([place])    NPDR OD, PDR OS   Dyspnea    Gout    "on daily RX" (01/08/2018)   Hypertension    Hypertensive retinopathy    OU   Ischemic cardiomyopathy    a. Echo 5/13: Mild LVE, mild LVH, EF 10%, anteroseptal, lateral, apical AK, mild MR, mild LAE, moderately reduced RVSF, mild RAE, PASP 60;  b. 01/2012 s/p MDT [id number] Protecta XT VR AICD;  c. 07/2014 Echo: EF 20-2%, diff HK, AKI of antsep/apical/mid-apicalinferior, mod reduced RV.   MRSA (methicillin resistant Staphylococcus aureus)    Status post right foot plantar deep infection with MRSA status post  I&D 10/2008   Myocardial infarction [facility])    "was told I'd had several before heart OR in 2002" (01/08/2018)   Noncompliance    Peripheral neuropathy    Retinopathy, diabetic, background ([place])    Syncope    Type II diabetes mellitus ([place])    requiring insulin    Vitreous hemorrhage, left (HCC)    and proliferative diabetic retinopathy    Current Outpatient Medications  Medication Sig Dispense Refill   acetaminophen (TYLENOL) 325 MG tablet Take 1-2 tablets (325-650 mg total) by mouth every 4 (four) hours as needed for mild pain.     ascorbic acid (VITAMIN C) 1000 MG tablet Take 1 tablet (1,000 mg total) by mouth daily. 90 tablet 3   insulin glargine (LANTUS) 100  UNIT/ML injection Inject 0.08 mLs (8 Units total) into the skin daily. Dx Code: E11.29 9 Pt is on sliding scale 10 mL 11   metoCLOPramide (REGLAN) 10 MG tablet Take 1 tablet (10 mg total) by mouth 3 (three) times daily before meals. [place]  tablet 11   midodrine (PROAMATINE) 10 MG tablet Take 1 tablet (10 mg total) by mouth 3 (three) times daily. Three times a day on dialysis days; NO midodrine on NON dialysis days 90 tablet 6   multivitamin (RENA-VIT) TABS tablet Take 1 tablet by mouth at bedtime. 90 tablet 3   pantoprazole (PROTONIX) 40 MG tablet Take 1 tablet (40 mg total) by mouth daily. 90 tablet 3   polyethylene glycol powder (GLYCOLAX/MIRALAX) 17 GM/SCOOP powder Dissolve 1 capful in water and drink 2 (two) times daily. 510 g 0   rosuvastatin (CRESTOR) 10 MG tablet Take 1 tablet (10 mg total) by mouth daily. 30 tablet 11   warfarin (COUMADIN) 3 MG tablet Take 4.5 mg (1.5 tabs) every Tuesday/Thursday/Saturday and 3 mg (1 tab) all other days or as directed by HF Clinic 180 tablet 3   gabapentin (NEURONTIN) 100 MG capsule Take one tablet as needed 90 capsule 11   LANTUS SOLOSTAR 100 UNIT/ML Solostar Pen INJECT 50 UNITS INTO THE SKIN AT BEDTIME. (Patient not taking: Reported on 06/27/2021) 3 mL 5   No current facility-administered medications for this encounter.    Meclizine hcl and Ivabradine  REVIEW OF SYSTEMS: All systems negative except as listed in HPI, PMH and Problem list.   LVAD INTERROGATION:  Please see LVAD nurse's note above.   BP (!) 132/96 Comment: MAP 96   Pulse 74    Temp 98.3 F (36.8 C) (Oral)    Ht 5\' 10"  (1.778 m)    Wt 73.1 kg (161 lb 3.2 oz)    SpO2 100%    BMI 23.13 kg/m  MAP 90  Physical Exam: General: Well appearing this am. NAD.  HEENT: Normal. Neck: Supple, JVP 7-8 cm. Carotids OK.  Cardiac:  Mechanical heart sounds with LVAD hum present.  Lungs:  CTAB, normal effort.  Abdomen:  NT, ND, no HSM. No bruits or masses. +BS  LVAD exit site: Well-healed and  incorporated. Dressing dry and intact. No erythema or drainage. Stabilization device present and accurately applied. Driveline dressing changed daily per sterile technique. Extremities:  Warm and dry. No cyanosis, clubbing, rash, or edema.  Neuro:  Alert & oriented x 3. Cranial nerves grossly intact. Right hand weakness. Affect pleasant    ASSESSMENT AND PLAN:  1. Chronic systolic CHF:  Ischemic cardiomyopathy. Has Medtronic ICD. Low output HF, admitted and Impella 5.5 placed 10/04/20.  HM3 LVAD was placed on 10/13/20. Developed post-op renal failure requiring eventual hemodialysis.  MAP stable.  He is not volume overloaded on exam.  No recent orthostatic symptoms.  - Continue midodrine to 10 mg tid on HD days.   2. LVAD:  VAD interrogated personally. Parameters stable with rare PI events.  - He is now off ASA.  - Continue warfarin with INR goal 2-2.5.   3. CAD s/p CABG 2002:  Last cath in 6/19 with patent grafts. No chest pain.  - Continue Crestor.  4. ESRD: Currently dialyzing via tunneled catheter. No low flow events.  - Continue HD M-W-F  - With continuous flow LVAD, think AV fistula would be unlikely to mature.   5. Atrial flutter/fibrillation: s/p DC-CV 08/21/18.  He has about 3.8% AF on device interrogation.  6. 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  - Continue gabapentin 7. DM: Per primary care.  8. Anemia: Hgb stable on recent labs.   9. Suspected Gastroparesis. Nausea improved on Reglan.   Followup in 2 months  Loralie Champagne 08/10/2021

## 2021-08-10 NOTE — Patient Instructions (Addendum)
May take gabapentin as needed only. Will ask Dialysis center to add LDH to labs per Dr. Aundra Dubin. Return to [facility] in 2 months. Will be your 1 yr Intermacs f/u - be prepared to do 6 minute walk. Bring your home equipment in for annual maintenance.  Ramp echo to be performed at next clinic visit.

## 2021-08-10 NOTE — Progress Notes (Signed)
CSW met with patient in the clinic. Patient reports he continues to attend dialysis 3x weekly and denies any concerns. He has a very supportive brother and son who he lives with and assist with all his care needs. Patient was in good spirits and was very talkative and social during visit. Patient appears to be adjusting well to VAD life and denies any concerns at this time. CSW continues to follow for support as needed. Raquel Sarna, [place], [place]

## 2021-08-11 ENCOUNTER — Other Ambulatory Visit (HOSPITAL_COMMUNITY): Payer: Self-pay | Admitting: Cardiology

## 2021-08-11 DIAGNOSIS — N2581 Secondary hyperparathyroidism of renal origin: Secondary | ICD-10-CM | POA: Diagnosis not present

## 2021-08-11 DIAGNOSIS — N186 End stage renal disease: Secondary | ICD-10-CM | POA: Diagnosis not present

## 2021-08-11 DIAGNOSIS — Z992 Dependence on renal dialysis: Secondary | ICD-10-CM | POA: Diagnosis not present

## 2021-08-14 ENCOUNTER — Other Ambulatory Visit (HOSPITAL_COMMUNITY): Payer: Self-pay | Admitting: *Deleted

## 2021-08-14 DIAGNOSIS — K3184 Gastroparesis: Secondary | ICD-10-CM

## 2021-08-14 DIAGNOSIS — I951 Orthostatic hypotension: Secondary | ICD-10-CM

## 2021-08-14 DIAGNOSIS — Z7901 Long term (current) use of anticoagulants: Secondary | ICD-10-CM

## 2021-08-14 DIAGNOSIS — Z95811 Presence of heart assist device: Secondary | ICD-10-CM

## 2021-08-14 DIAGNOSIS — Z992 Dependence on renal dialysis: Secondary | ICD-10-CM | POA: Diagnosis not present

## 2021-08-14 DIAGNOSIS — N2581 Secondary hyperparathyroidism of renal origin: Secondary | ICD-10-CM | POA: Diagnosis not present

## 2021-08-14 DIAGNOSIS — N186 End stage renal disease: Secondary | ICD-10-CM | POA: Diagnosis not present

## 2021-08-14 MED ORDER — WARFARIN SODIUM 3 MG PO TABS: ORAL_TABLET | ORAL | 3 refills | Status: DC

## 2021-08-15 ENCOUNTER — Other Ambulatory Visit (HOSPITAL_COMMUNITY): Payer: Self-pay | Admitting: *Deleted

## 2021-08-15 ENCOUNTER — Ambulatory Visit (HOSPITAL_COMMUNITY): Payer: Self-pay | Admitting: Pharmacist

## 2021-08-15 DIAGNOSIS — Z95811 Presence of heart assist device: Secondary | ICD-10-CM

## 2021-08-15 DIAGNOSIS — Z7901 Long term (current) use of anticoagulants: Secondary | ICD-10-CM

## 2021-08-15 DIAGNOSIS — I951 Orthostatic hypotension: Secondary | ICD-10-CM

## 2021-08-15 DIAGNOSIS — K3184 Gastroparesis: Secondary | ICD-10-CM

## 2021-08-15 LAB — POCT INR: INR: 2.2 (ref 2.0–3.0)

## 2021-08-15 MED ORDER — WARFARIN SODIUM 3 MG PO TABS: ORAL_TABLET | ORAL | 3 refills | Status: DC

## 2021-08-15 NOTE — Progress Notes (Signed)
LVAD INR 

## 2021-08-16 DIAGNOSIS — N186 End stage renal disease: Secondary | ICD-10-CM | POA: Diagnosis not present

## 2021-08-16 DIAGNOSIS — N2581 Secondary hyperparathyroidism of renal origin: Secondary | ICD-10-CM | POA: Diagnosis not present

## 2021-08-16 DIAGNOSIS — Z992 Dependence on renal dialysis: Secondary | ICD-10-CM | POA: Diagnosis not present

## 2021-08-18 DIAGNOSIS — N2581 Secondary hyperparathyroidism of renal origin: Secondary | ICD-10-CM | POA: Diagnosis not present

## 2021-08-18 DIAGNOSIS — N186 End stage renal disease: Secondary | ICD-10-CM | POA: Diagnosis not present

## 2021-08-18 DIAGNOSIS — Z992 Dependence on renal dialysis: Secondary | ICD-10-CM | POA: Diagnosis not present

## 2021-08-21 ENCOUNTER — Other Ambulatory Visit (HOSPITAL_COMMUNITY): Payer: Self-pay | Admitting: Cardiology

## 2021-08-21 DIAGNOSIS — N186 End stage renal disease: Secondary | ICD-10-CM | POA: Diagnosis not present

## 2021-08-21 DIAGNOSIS — N2581 Secondary hyperparathyroidism of renal origin: Secondary | ICD-10-CM | POA: Diagnosis not present

## 2021-08-21 DIAGNOSIS — Z992 Dependence on renal dialysis: Secondary | ICD-10-CM | POA: Diagnosis not present

## 2021-08-22 ENCOUNTER — Ambulatory Visit (HOSPITAL_COMMUNITY): Payer: Self-pay | Admitting: Pharmacist

## 2021-08-22 LAB — POCT INR: INR: 2.2 (ref 2.0–3.0)

## 2021-08-22 NOTE — Progress Notes (Signed)
LVAD INR 

## 2021-08-23 DIAGNOSIS — N2581 Secondary hyperparathyroidism of renal origin: Secondary | ICD-10-CM | POA: Diagnosis not present

## 2021-08-23 DIAGNOSIS — Z992 Dependence on renal dialysis: Secondary | ICD-10-CM | POA: Diagnosis not present

## 2021-08-23 DIAGNOSIS — N186 End stage renal disease: Secondary | ICD-10-CM | POA: Diagnosis not present

## 2021-08-24 ENCOUNTER — Encounter (HOSPITAL_COMMUNITY): Payer: Medicare HMO

## 2021-08-25 DIAGNOSIS — Z992 Dependence on renal dialysis: Secondary | ICD-10-CM | POA: Diagnosis not present

## 2021-08-25 DIAGNOSIS — N186 End stage renal disease: Secondary | ICD-10-CM | POA: Diagnosis not present

## 2021-08-25 DIAGNOSIS — N2581 Secondary hyperparathyroidism of renal origin: Secondary | ICD-10-CM | POA: Diagnosis not present

## 2021-08-28 DIAGNOSIS — Z992 Dependence on renal dialysis: Secondary | ICD-10-CM | POA: Diagnosis not present

## 2021-08-28 DIAGNOSIS — N2581 Secondary hyperparathyroidism of renal origin: Secondary | ICD-10-CM | POA: Diagnosis not present

## 2021-08-28 DIAGNOSIS — N186 End stage renal disease: Secondary | ICD-10-CM | POA: Diagnosis not present

## 2021-08-29 ENCOUNTER — Ambulatory Visit (HOSPITAL_COMMUNITY): Payer: Self-pay | Admitting: Pharmacist

## 2021-08-29 DIAGNOSIS — Z7901 Long term (current) use of anticoagulants: Secondary | ICD-10-CM | POA: Diagnosis not present

## 2021-08-29 LAB — POCT INR: INR: 1.9 — AB (ref 2.0–3.0)

## 2021-08-29 NOTE — Progress Notes (Signed)
LVAD INR 

## 2021-08-30 DIAGNOSIS — Z992 Dependence on renal dialysis: Secondary | ICD-10-CM | POA: Diagnosis not present

## 2021-08-30 DIAGNOSIS — N2581 Secondary hyperparathyroidism of renal origin: Secondary | ICD-10-CM | POA: Diagnosis not present

## 2021-08-30 DIAGNOSIS — N186 End stage renal disease: Secondary | ICD-10-CM | POA: Diagnosis not present

## 2021-09-01 DIAGNOSIS — N2581 Secondary hyperparathyroidism of renal origin: Secondary | ICD-10-CM | POA: Diagnosis not present

## 2021-09-01 DIAGNOSIS — N186 End stage renal disease: Secondary | ICD-10-CM | POA: Diagnosis not present

## 2021-09-01 DIAGNOSIS — Z992 Dependence on renal dialysis: Secondary | ICD-10-CM | POA: Diagnosis not present

## 2021-09-04 DIAGNOSIS — N2581 Secondary hyperparathyroidism of renal origin: Secondary | ICD-10-CM | POA: Diagnosis not present

## 2021-09-04 DIAGNOSIS — N186 End stage renal disease: Secondary | ICD-10-CM | POA: Diagnosis not present

## 2021-09-04 DIAGNOSIS — Z992 Dependence on renal dialysis: Secondary | ICD-10-CM | POA: Diagnosis not present

## 2021-09-05 ENCOUNTER — Ambulatory Visit (HOSPITAL_COMMUNITY): Payer: Self-pay | Admitting: Pharmacist

## 2021-09-05 DIAGNOSIS — Z992 Dependence on renal dialysis: Secondary | ICD-10-CM | POA: Diagnosis not present

## 2021-09-05 DIAGNOSIS — I509 Heart failure, unspecified: Secondary | ICD-10-CM | POA: Diagnosis not present

## 2021-09-05 DIAGNOSIS — N186 End stage renal disease: Secondary | ICD-10-CM | POA: Diagnosis not present

## 2021-09-05 LAB — POCT INR: INR: 2.3 (ref 2.0–3.0)

## 2021-09-05 NOTE — Progress Notes (Signed)
LVAD INR 

## 2021-09-06 DIAGNOSIS — N2581 Secondary hyperparathyroidism of renal origin: Secondary | ICD-10-CM | POA: Diagnosis not present

## 2021-09-06 DIAGNOSIS — Z992 Dependence on renal dialysis: Secondary | ICD-10-CM | POA: Diagnosis not present

## 2021-09-06 DIAGNOSIS — N186 End stage renal disease: Secondary | ICD-10-CM | POA: Diagnosis not present

## 2021-09-08 DIAGNOSIS — N2581 Secondary hyperparathyroidism of renal origin: Secondary | ICD-10-CM | POA: Diagnosis not present

## 2021-09-08 DIAGNOSIS — N186 End stage renal disease: Secondary | ICD-10-CM | POA: Diagnosis not present

## 2021-09-08 DIAGNOSIS — Z992 Dependence on renal dialysis: Secondary | ICD-10-CM | POA: Diagnosis not present

## 2021-09-11 DIAGNOSIS — N186 End stage renal disease: Secondary | ICD-10-CM | POA: Diagnosis not present

## 2021-09-11 DIAGNOSIS — N2581 Secondary hyperparathyroidism of renal origin: Secondary | ICD-10-CM | POA: Diagnosis not present

## 2021-09-11 DIAGNOSIS — Z992 Dependence on renal dialysis: Secondary | ICD-10-CM | POA: Diagnosis not present

## 2021-09-12 ENCOUNTER — Ambulatory Visit (HOSPITAL_COMMUNITY): Payer: Self-pay | Admitting: Pharmacist

## 2021-09-12 LAB — POCT INR: INR: 2.6 (ref 2.0–3.0)

## 2021-09-12 NOTE — Progress Notes (Signed)
LVAD INR 

## 2021-09-13 DIAGNOSIS — Z992 Dependence on renal dialysis: Secondary | ICD-10-CM | POA: Diagnosis not present

## 2021-09-13 DIAGNOSIS — N2581 Secondary hyperparathyroidism of renal origin: Secondary | ICD-10-CM | POA: Diagnosis not present

## 2021-09-13 DIAGNOSIS — N186 End stage renal disease: Secondary | ICD-10-CM | POA: Diagnosis not present

## 2021-09-15 DIAGNOSIS — Z992 Dependence on renal dialysis: Secondary | ICD-10-CM | POA: Diagnosis not present

## 2021-09-15 DIAGNOSIS — N2581 Secondary hyperparathyroidism of renal origin: Secondary | ICD-10-CM | POA: Diagnosis not present

## 2021-09-15 DIAGNOSIS — N186 End stage renal disease: Secondary | ICD-10-CM | POA: Diagnosis not present

## 2021-09-18 DIAGNOSIS — N186 End stage renal disease: Secondary | ICD-10-CM | POA: Diagnosis not present

## 2021-09-18 DIAGNOSIS — N2581 Secondary hyperparathyroidism of renal origin: Secondary | ICD-10-CM | POA: Diagnosis not present

## 2021-09-18 DIAGNOSIS — Z992 Dependence on renal dialysis: Secondary | ICD-10-CM | POA: Diagnosis not present

## 2021-09-19 ENCOUNTER — Telehealth: Payer: Self-pay

## 2021-09-19 ENCOUNTER — Other Ambulatory Visit (HOSPITAL_COMMUNITY): Payer: Self-pay | Admitting: *Deleted

## 2021-09-19 ENCOUNTER — Encounter (HOSPITAL_COMMUNITY): Payer: Self-pay | Admitting: *Deleted

## 2021-09-19 ENCOUNTER — Ambulatory Visit (HOSPITAL_COMMUNITY)
Admission: RE | Admit: 2021-09-19 | Discharge: 2021-09-19 | Disposition: A | Discharge status: Home / Self Care | Payer: Medicare HMO | Source: Ambulatory Visit | Attending: Cardiology | Admitting: Cardiology

## 2021-09-19 ENCOUNTER — Ambulatory Visit (HOSPITAL_COMMUNITY): Payer: Self-pay | Admitting: Pharmacist

## 2021-09-19 ENCOUNTER — Telehealth (HOSPITAL_COMMUNITY): Payer: Self-pay | Admitting: *Deleted

## 2021-09-19 DIAGNOSIS — R200 Anesthesia of skin: Secondary | ICD-10-CM | POA: Diagnosis not present

## 2021-09-19 DIAGNOSIS — R202 Paresthesia of skin: Secondary | ICD-10-CM | POA: Insufficient documentation

## 2021-09-19 DIAGNOSIS — Z95811 Presence of heart assist device: Secondary | ICD-10-CM

## 2021-09-19 LAB — POCT INR: INR: 2.5 (ref 2.0–3.0)

## 2021-09-19 NOTE — Telephone Encounter (Addendum)
Allison at the heart clinic is placing NP referral for pt to [facility] neuro , sending this to Hinton Dyer so she can be on the look out for the referral

## 2021-09-19 NOTE — Telephone Encounter (Signed)
Ebony Hail from the heart clinic called stated at pt called in stated he was having right arm numbness and intermittent right facial numbness up into the top of his head, and left arm intermittent numbness that he feels after dialysis treatments over the last week. No other stroke symptoms noted per Ebony Hail. They are asking if pt can get a sooner appointment with Dr Posey Pronto. Per Ebony Hail they talked to Dr Aundra Dubin pt was not advised to go be evaluated. Pt needs appointments on Tuesday and Thursdays because of dialysis

## 2021-09-19 NOTE — Progress Notes (Signed)
Prior authorization completed for CT head without contrast. Auth #: [id number].  Emerson Monte RN [place] Coordinator  Office: [phone]  24/7 Pager: [phone]

## 2021-09-19 NOTE — Progress Notes (Signed)
LVAD INR 

## 2021-09-19 NOTE — Telephone Encounter (Signed)
Received call from patient reporting intermittent right facial numbness up into the top of his head, right arm intermittent numbness, and left arm intermittent numbness that he feels after HD treatments over the last week. He is asymptomatic this morning. Denies slurred speech, difficulty speaking, and leg weakness/numbness during any of these episodes.   VAD numbers stable: Speed: 5200 Flow: 4.9 Power: 3.7 w PI: 2.9   Reports PI as low as 1.0 this morning. They pulled 3.6 L off in HD yesterday. He is asymptomatic with low PI. Encouraged pt it increase fluid intake today as needed.   Discussed the above with Dr Aundra Dubin. Will schedule outpatient CT head and schedule appt with his neurologist Dr Posey Pronto. Spoke with Nira Conn at [facility] and they will call patient to schedule an appointment.   Called and made pt aware of the above. Will call him with CT head appt information once scheduled. He verbalized understanding of all the above.   Emerson Monte RN [place] Coordinator  Office: [phone]  24/7 Pager: [phone]

## 2021-09-19 NOTE — Telephone Encounter (Signed)
Patient had EMG only by me.  He is not established patient of mine.  He will need a NP referral and can see any provider with next available appointment.

## 2021-09-20 DIAGNOSIS — N2581 Secondary hyperparathyroidism of renal origin: Secondary | ICD-10-CM | POA: Diagnosis not present

## 2021-09-20 DIAGNOSIS — Z992 Dependence on renal dialysis: Secondary | ICD-10-CM | POA: Diagnosis not present

## 2021-09-20 DIAGNOSIS — N186 End stage renal disease: Secondary | ICD-10-CM | POA: Diagnosis not present

## 2021-09-21 DIAGNOSIS — E876 Hypokalemia: Secondary | ICD-10-CM | POA: Insufficient documentation

## 2021-09-22 DIAGNOSIS — N2581 Secondary hyperparathyroidism of renal origin: Secondary | ICD-10-CM | POA: Diagnosis not present

## 2021-09-22 DIAGNOSIS — N186 End stage renal disease: Secondary | ICD-10-CM | POA: Diagnosis not present

## 2021-09-22 DIAGNOSIS — Z992 Dependence on renal dialysis: Secondary | ICD-10-CM | POA: Diagnosis not present

## 2021-09-25 DIAGNOSIS — N186 End stage renal disease: Secondary | ICD-10-CM | POA: Diagnosis not present

## 2021-09-25 DIAGNOSIS — N2581 Secondary hyperparathyroidism of renal origin: Secondary | ICD-10-CM | POA: Diagnosis not present

## 2021-09-25 DIAGNOSIS — Z992 Dependence on renal dialysis: Secondary | ICD-10-CM | POA: Diagnosis not present

## 2021-09-26 ENCOUNTER — Ambulatory Visit (HOSPITAL_COMMUNITY): Payer: Self-pay | Admitting: Pharmacist

## 2021-09-26 DIAGNOSIS — Z7901 Long term (current) use of anticoagulants: Secondary | ICD-10-CM | POA: Diagnosis not present

## 2021-09-26 LAB — POCT INR: INR: 3.1 — AB (ref 2.0–3.0)

## 2021-09-26 NOTE — Progress Notes (Signed)
LVAD INR 

## 2021-09-27 DIAGNOSIS — N186 End stage renal disease: Secondary | ICD-10-CM | POA: Diagnosis not present

## 2021-09-27 DIAGNOSIS — Z992 Dependence on renal dialysis: Secondary | ICD-10-CM | POA: Diagnosis not present

## 2021-09-27 DIAGNOSIS — N2581 Secondary hyperparathyroidism of renal origin: Secondary | ICD-10-CM | POA: Diagnosis not present

## 2021-09-28 ENCOUNTER — Encounter: Payer: Self-pay | Admitting: Neurology

## 2021-09-28 ENCOUNTER — Other Ambulatory Visit: Payer: Self-pay

## 2021-09-28 ENCOUNTER — Ambulatory Visit: Payer: Medicare HMO | Admitting: Neurology

## 2021-09-28 VITALS — HR 81 | Ht 70.0 in | Wt 155.0 lb

## 2021-09-28 DIAGNOSIS — G629 Polyneuropathy, unspecified: Secondary | ICD-10-CM
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
## 2021-09-28 NOTE — Patient Instructions (Addendum)
Please talk to your nephrologist about any potential changes to your dialysis regimen to see if it will help your tingling on days of dialysis.  You can continue to take gabapentin 100mg  twice daily  Take extra caution when you are walking on uneven ground, always use a walker  Check your feet daily  If your symptoms change or you would like to proceed with CT cervical spine, please let me know  You can try using a wrist brace at night time and see if this provides any relief

## 2021-09-29 DIAGNOSIS — N186 End stage renal disease: Secondary | ICD-10-CM | POA: Diagnosis not present

## 2021-09-29 DIAGNOSIS — Z992 Dependence on renal dialysis: Secondary | ICD-10-CM | POA: Diagnosis not present

## 2021-09-29 DIAGNOSIS — N2581 Secondary hyperparathyroidism of renal origin: Secondary | ICD-10-CM | POA: Diagnosis not present

## 2021-10-02 ENCOUNTER — Other Ambulatory Visit: Payer: Self-pay

## 2021-10-02 ENCOUNTER — Ambulatory Visit (HOSPITAL_COMMUNITY)
Admission: RE | Admit: 2021-10-02 | Discharge: 2021-10-02 | Disposition: A | Discharge status: Home / Self Care | Payer: Medicare HMO | Source: Ambulatory Visit | Attending: Internal Medicine | Admitting: Internal Medicine

## 2021-10-02 DIAGNOSIS — N186 End stage renal disease: Secondary | ICD-10-CM | POA: Diagnosis not present

## 2021-10-02 DIAGNOSIS — Z95811 Presence of heart assist device: Secondary | ICD-10-CM | POA: Insufficient documentation

## 2021-10-02 DIAGNOSIS — N2581 Secondary hyperparathyroidism of renal origin: Secondary | ICD-10-CM | POA: Diagnosis not present

## 2021-10-02 DIAGNOSIS — I5023 Acute on chronic systolic (congestive) heart failure: Secondary | ICD-10-CM

## 2021-10-02 DIAGNOSIS — Z4801 Encounter for change or removal of surgical wound dressing: Secondary | ICD-10-CM | POA: Diagnosis not present

## 2021-10-02 DIAGNOSIS — Z992 Dependence on renal dialysis: Secondary | ICD-10-CM | POA: Diagnosis not present

## 2021-10-02 MED ORDER — DOXYCYCLINE HYCLATE 50 MG PO CAPS: 100.0000 mg | ORAL_CAPSULE | Freq: Two times a day (BID) | ORAL | 0 refills | Status: AC

## 2021-10-02 MED ORDER — TRAMADOL HCL 50 MG PO TABS
50.0000 mg | ORAL_TABLET | Freq: Three times a day (TID) | ORAL | 0 refills | Status: DC | PRN
Start: 2021-10-02 — End: 2021-11-08

## 2021-10-02 NOTE — Patient Instructions (Signed)
Advance to twice weekly dressing changes. Change more often if drainage occurs.  Start Doxy 100 mg (2 tablets) twice daily for 10 days. Beware of sun exposure.  Return to [facility] 10/12/21 at 9:00 am. Bring home equipment and be prepared to walk. Will also do ramp echo at that visit.

## 2021-10-03 ENCOUNTER — Ambulatory Visit (HOSPITAL_COMMUNITY): Payer: Self-pay | Admitting: Pharmacist

## 2021-10-03 DIAGNOSIS — N186 End stage renal disease: Secondary | ICD-10-CM | POA: Diagnosis not present

## 2021-10-03 DIAGNOSIS — I509 Heart failure, unspecified: Secondary | ICD-10-CM | POA: Diagnosis not present

## 2021-10-03 DIAGNOSIS — Z992 Dependence on renal dialysis: Secondary | ICD-10-CM | POA: Diagnosis not present

## 2021-10-03 LAB — POCT INR: INR: 2.2 (ref 2.0–3.0)

## 2021-10-03 NOTE — Progress Notes (Signed)
LVAD INR 

## 2021-10-03 NOTE — Progress Notes (Signed)
Received page from pt stating his has "blood pouring from my driveline." Pt denies any known trauma. Asked pt to come to clinic.  Pt arrived to clinic by [facility] with his brother [place].   Existing VAD dressing removed and site care performed using sterile technique. Drive line exit site cleaned with Chlora prep applicators x 2, rinsed with saline, allowed to dry, and gauze dressing with silver strip applied. Exit site slightly red and incorporated, the velour is fully implanted at exit site. Mild tenderness, moderate amount of brown drainage, with foul odor, no rash noted. Drive line anchor re-applied. Pt denies fever or chills.      Plan:  Start Doxcycline 100 mg bid for 10 days. Increase dressing changes to twice a week using the daily kit  We will f/u with you on 3/9, please call if drainage increases or you start to have pain  D/w all orders etc with Dr. Harvie Heck RN, BSN VAD Coordinator 24/7 Pager [phone]

## 2021-10-04 DIAGNOSIS — N2581 Secondary hyperparathyroidism of renal origin: Secondary | ICD-10-CM | POA: Diagnosis not present

## 2021-10-04 DIAGNOSIS — Z992 Dependence on renal dialysis: Secondary | ICD-10-CM | POA: Diagnosis not present

## 2021-10-04 DIAGNOSIS — N186 End stage renal disease: Secondary | ICD-10-CM | POA: Diagnosis not present

## 2021-10-05 ENCOUNTER — Other Ambulatory Visit (HOSPITAL_COMMUNITY): Payer: Medicare HMO

## 2021-10-05 ENCOUNTER — Encounter (HOSPITAL_COMMUNITY): Payer: Medicare HMO

## 2021-10-06 DIAGNOSIS — Z992 Dependence on renal dialysis: Secondary | ICD-10-CM | POA: Diagnosis not present

## 2021-10-06 DIAGNOSIS — N186 End stage renal disease: Secondary | ICD-10-CM | POA: Diagnosis not present

## 2021-10-06 DIAGNOSIS — N2581 Secondary hyperparathyroidism of renal origin: Secondary | ICD-10-CM | POA: Diagnosis not present

## 2021-10-06 LAB — AEROBIC CULTURE W GRAM STAIN (SUPERFICIAL SPECIMEN): Gram Stain: NONE SEEN

## 2021-10-09 DIAGNOSIS — N2581 Secondary hyperparathyroidism of renal origin: Secondary | ICD-10-CM | POA: Diagnosis not present

## 2021-10-09 DIAGNOSIS — N186 End stage renal disease: Secondary | ICD-10-CM | POA: Diagnosis not present

## 2021-10-09 DIAGNOSIS — Z992 Dependence on renal dialysis: Secondary | ICD-10-CM | POA: Diagnosis not present

## 2021-10-10 ENCOUNTER — Ambulatory Visit (INDEPENDENT_AMBULATORY_CARE_PROVIDER_SITE_OTHER): Payer: Medicare HMO

## 2021-10-10 ENCOUNTER — Ambulatory Visit (HOSPITAL_COMMUNITY): Payer: Self-pay | Admitting: Pharmacist

## 2021-10-10 DIAGNOSIS — I255 Ischemic cardiomyopathy: Secondary | ICD-10-CM | POA: Diagnosis not present

## 2021-10-10 DIAGNOSIS — Z95811 Presence of heart assist device: Secondary | ICD-10-CM | POA: Insufficient documentation

## 2021-10-10 LAB — CUP PACEART REMOTE DEVICE CHECK
Battery Remaining Longevity: 131 mo
Battery Voltage: 3.03 V
Brady Statistic RV Percent Paced: 0.01 %
Date Time Interrogation Session: 20230307071230
HighPow Impedance: 30 Ohm
HighPow Impedance: 36 Ohm
Implantable Lead Implant Date: 20130603
Implantable Lead Location: 753860
Implantable Lead Model: 6947
Implantable Pulse Generator Implant Date: 20211129
Lead Channel Impedance Value: 304 Ohm
Lead Channel Impedance Value: 589 Ohm
Lead Channel Pacing Threshold Amplitude: 1.25 V
Lead Channel Pacing Threshold Pulse Width: 0.4 ms
Lead Channel Sensing Intrinsic Amplitude: 4.375 mV
Lead Channel Sensing Intrinsic Amplitude: 4.375 mV
Lead Channel Setting Pacing Amplitude: 2.75 V
Lead Channel Setting Pacing Pulse Width: 0.4 ms
Lead Channel Setting Sensing Sensitivity: 0.3 mV

## 2021-10-10 LAB — POCT INR: INR: 2 (ref 2.0–3.0)

## 2021-10-10 NOTE — Progress Notes (Signed)
LVAD INR 

## 2021-10-11 DIAGNOSIS — N2581 Secondary hyperparathyroidism of renal origin: Secondary | ICD-10-CM | POA: Diagnosis not present

## 2021-10-11 DIAGNOSIS — Z992 Dependence on renal dialysis: Secondary | ICD-10-CM | POA: Diagnosis not present

## 2021-10-11 DIAGNOSIS — N186 End stage renal disease: Secondary | ICD-10-CM | POA: Diagnosis not present

## 2021-10-12 ENCOUNTER — Encounter (HOSPITAL_COMMUNITY): Payer: Self-pay

## 2021-10-12 ENCOUNTER — Other Ambulatory Visit: Payer: Self-pay

## 2021-10-12 ENCOUNTER — Ambulatory Visit (HOSPITAL_COMMUNITY)
Admission: RE | Admit: 2021-10-12 | Discharge: 2021-10-12 | Disposition: A | Discharge status: Home / Self Care | Payer: Medicare HMO | Source: Ambulatory Visit | Attending: Internal Medicine | Admitting: Internal Medicine

## 2021-10-12 ENCOUNTER — Ambulatory Visit (HOSPITAL_BASED_OUTPATIENT_CLINIC_OR_DEPARTMENT_OTHER)
Admission: RE | Admit: 2021-10-12 | Discharge: 2021-10-12 | Disposition: A | Discharge status: Home / Self Care | Payer: Medicare HMO | Source: Ambulatory Visit | Attending: Cardiology | Admitting: Cardiology

## 2021-10-12 VITALS — BP 109/86 | HR 92 | Temp 98.2°F | Ht 70.0 in | Wt 160.4 lb

## 2021-10-12 DIAGNOSIS — I5042 Chronic combined systolic (congestive) and diastolic (congestive) heart failure: Secondary | ICD-10-CM | POA: Diagnosis not present

## 2021-10-12 DIAGNOSIS — Z95811 Presence of heart assist device: Secondary | ICD-10-CM

## 2021-10-12 DIAGNOSIS — E1136 Type 2 diabetes mellitus with diabetic cataract: Secondary | ICD-10-CM | POA: Insufficient documentation

## 2021-10-12 DIAGNOSIS — I132 Hypertensive heart and chronic kidney disease with heart failure and with stage 5 chronic kidney disease, or end stage renal disease: Secondary | ICD-10-CM | POA: Diagnosis not present

## 2021-10-12 DIAGNOSIS — N186 End stage renal disease: Secondary | ICD-10-CM | POA: Insufficient documentation

## 2021-10-12 DIAGNOSIS — Z951 Presence of aortocoronary bypass graft: Secondary | ICD-10-CM | POA: Diagnosis not present

## 2021-10-12 DIAGNOSIS — I255 Ischemic cardiomyopathy: Secondary | ICD-10-CM | POA: Diagnosis not present

## 2021-10-12 DIAGNOSIS — I5022 Chronic systolic (congestive) heart failure: Secondary | ICD-10-CM | POA: Diagnosis not present

## 2021-10-12 DIAGNOSIS — I4891 Unspecified atrial fibrillation: Secondary | ICD-10-CM | POA: Diagnosis not present

## 2021-10-12 DIAGNOSIS — I2510 Atherosclerotic heart disease of native coronary artery without angina pectoris: Secondary | ICD-10-CM | POA: Insufficient documentation

## 2021-10-12 DIAGNOSIS — Z7901 Long term (current) use of anticoagulants: Secondary | ICD-10-CM | POA: Diagnosis not present

## 2021-10-12 DIAGNOSIS — I4892 Unspecified atrial flutter: Secondary | ICD-10-CM | POA: Diagnosis not present

## 2021-10-12 DIAGNOSIS — Z992 Dependence on renal dialysis: Secondary | ICD-10-CM | POA: Insufficient documentation

## 2021-10-12 DIAGNOSIS — Z79899 Other long term (current) drug therapy: Secondary | ICD-10-CM | POA: Insufficient documentation

## 2021-10-12 DIAGNOSIS — D649 Anemia, unspecified: Secondary | ICD-10-CM | POA: Diagnosis not present

## 2021-10-12 DIAGNOSIS — E1122 Type 2 diabetes mellitus with diabetic chronic kidney disease: Secondary | ICD-10-CM | POA: Diagnosis not present

## 2021-10-12 DIAGNOSIS — M79641 Pain in right hand: Secondary | ICD-10-CM | POA: Insufficient documentation

## 2021-10-12 NOTE — Patient Instructions (Signed)
No change in medications.  ?2.   Call if any dizziness or lightheadedness. ?3    Return to [place] clinic in two months. ?

## 2021-10-12 NOTE — Progress Notes (Addendum)
Patient presents for 2 mo f/u with 1 year Intermacs and annual maintenance in [facility] today with brother Marcus Johnson). Reports no problems with VAD equipment or concerns with drive line.  Ramp echo performed this visit per Dr. Aundra Dubin - see below.   Patient arrived ambulatory today. Pt reports he gets around "better" and reports he walked @ 1/2 mile with his brother this past week. He denies any SOB during this walk. He is also going up and down 1 flight of stairs daily at home without difficulty.    Patient reports he continues M/W/F dialysis and that is dry weight has been dropped to 72 kg Pt confirms he increased his midodrine to 10 mg TID on HD days only. Reports lightheadedness/dizziness after dialysis treatments has improved but he does still have occasionally. He denies any falls since last clinic visit.    Pt reports he completed course of Doxy today for drive line discharge - dressing changed; see below.   Pt reports he had Neurology visit and was told to continue gabapentin. No other interventions at this time.   Labs drawn weekly at dialysis. Results from 09/13/21 reviewed by Dr. Aundra Dubin. Order has been sent to [facility] to add monthly LDH.  Called and confirmed order has been received and LDH will be drawn monthly.  MPU with very tangled patient cable; examined by Belva Bertin [facility]) with recommendation to replace. Loaner MPU [phone] given to patient for home use. Will order new MPU for patient.   Vital Signs:  Temp: 98.2 Doppler Pressure: 96 Automatc BP: 109?86 (94) HR: 92 O2: 100% RA   Weight: 160.4 lb w/ eqt Last wt: 161.2 lb    VAD Indication: Destination Therapy due to CKD Stage 3b   LVAD assessment:  HM III:: VAD Speed: 5200 rpms Flow:  4.5 Power: 3.8w    PI: 3.6 Alarms: none Events: 10-40 PI events daily  Fixed speed:  5200 Low speed limit:  4900  Primary controller: back up battery due for replacement in 21 months Secondary controller:  back up  battery due for replacement in 25 months   I reviewed the LVAD parameters from today and compared the results to the patient's prior recorded data. LVAD interrogation was NEGATIVE for significant power changes, NEGATIVE for clinical alarms and STABLE for PI events/speed drops. No programming changes were made and pump is functioning within specified parameters. Pt is performing daily controller and system monitor self tests along with completing weekly and monthly maintenance for LVAD equipment.   LVAD equipment check completed and is in good working order. Back-up equipment present.    Annual Equipment Maintenance on UBC/PM was performed today 10/12/21.  Exit Site Care: Gauze VAD dressing and anchor removed and site care performed using sterile technique. Drive line exit site cleaned with Chlora prep applicators x 2, allowed to dry before Sorbaview dressing with silverlon patch applied. Exit site incorporated. The velour is fully implanted at exit site. No redness or tenderness noted. Drive line anchor re-applied. Pt denies fever or chills. Will advance back to weekly dressing change per sterile technique by son Marcus Johnson).  Provided with 8 weekly dressing kits and size 8 sterile gloves.    Device: Medtronic single Therapies: on 150 bpm Pacing: VVI 40 Last check:  07/12/21   BP & Labs:  Doppler 96 - Doppler is reflecting MAP   Hgb 10.4- No S/S of bleeding. Specifically denies melena/BRBPR or nosebleeds.   LDH  not done - established baseline of 160 -  180. Denies tea-colored urine. No power elevations noted on interrogation.    Batteries Manufacture Date: Number of uses: Re-calibration  08/31/20 15 - 20 Performed by patient   Annual maintenance completed per [employer] on patients home power module and Electrical engineer.    Backup system controller 11 volt battery charged during visit.   1 year Intermacs follow up completed including:  Quality of Life and KCCQ-12 with assistance of  brother. Patient unable to write - did not attempt Neurocognitive trail making.   Pt did not attempt 6 minute walk - no time per brother; needed to leave.   [facility] Cardiomyopathy Questionnaire  KCCQ-12 10/12/2021 03/28/2021 01/03/2021  1 a. Ability to shower/bathe Extremely limited Slightly limited Not at all limited  1 b. Ability to walk 1 block Slightly limited Slightly limited Moderately limited  1 c. Ability to hurry/jog Other, Did not do Extremely limited Other, Did not do  2. Edema feet/ankles/legs Never over the past 2 weeks Never over the past 2 weeks Never over the past 2 weeks  3. Limited by fatigue Never over the past 2 weeks Never over the past 2 weeks 3+ times per week, not every day  4. Limited by dyspnea Less than once a week At least once a day Never over the past 2 weeks  5. Sitting up / on 3+ pillows Never over the past 2 weeks 1-2 times a week Never over the past 2 weeks  6. Limited enjoyment of life Not limited at all Limited quite a bit Extremely limited  7. Rest of life w/ symptoms Somewhat satisfied Somewhat satisfied Somewhat satisfied  8 a. Participation in hobbies Slightly limited Limited quite a bit Limited quite a bit  8 b. Participation in chores Slightly limited Limited quite a bit Moderately limited  8 c. Visiting family/friends Slightly limited Limited quite a bit N/A, did not do for other reasons    Patient Goals:. To live and see my grandchildren do something with their lives.    Ramp Echo:   Speed  Flow  PI  Power  LVIDD  AI  Aortic opening MR  TR  Septum  RV   5200  4.5 3.6 3.8 5.5 mod 5/5 none mild midline Mild HK   5300  4.6 3.3 3.9 5.2 mod 5/5 trace mild midline   5400  4.8 3.2 4.1 5.1 mod 5/5 trace mild midline   5500  4.9 3.3 4.2 5.0 Mild mod 5/5 trace mild midline   5600  5.0 3.3 4.4 5.0 Mild mod 5/5 trace Mild  Midline    5700  5.2 3.2 4.8 4.9 Mild mod 5/5 trace mild midline   5800  5.3 3.3 4.7 4.9 Mild mod 5/5 Trace  mild midline                  Doppler MAP: 96 Auto cuff BP: 109/86 (94)     122/96 (106)   Ramp ECHO performed in clinic by Dr. Aundra Dubin  At completion of ramp study, patients primary controller programmed:  Fixed speed:  5800 Low speed limit:  5500   Patient Instructions: No change in medications.  2.   Call if any dizziness or lightheadedness. 3    Return to [place] [facility] in two months.  Zada Girt RN [place] Coordinator  Office: [phone]  24/7 Pager: [phone]    Follow up for Heart Failure/LVAD:  Marcus Johnson. is a 61 y.o. male who has a h/o DM2, CAD s/p CABG  3295, systolic HF due to ischemic cardiomyopathy with EF 20-25% (echo 12/15), DM2 and CKD. He is s/p Medtronic single chamber ICD.   Admitted in 12/15 due to ADHF. Required short course milrinone for diuresis. Diuresed 30 pounds.    CPX 2/16 showed severely reduced functional capacity.  There was a significant disconnect between his symptoms and the CPX.  RHC in 6/16 showed fairly normal filling pressures and low but not markedly low cardiac output.    CPX (4/19) was submaximal but suggested severe limitation from HF.    He was admitted in 6/19 with marked volume overload and dyspnea.  Echo in 6/19 showed EF 20% with severe global hypokinesis.  LHC/RHC showed severe native vessel CAD with patent LIMA-D, SVG-OM, and SVG-PLV; cardiac index low.  He was started on milrinone and diuresed.  We discussed LVAD extensively in light of low output, creatinine that is trending up and concern for decompensation of RV over time. He was adamant that he did not want to pursue LVAD workup yet and did not want a referral for transplant evaluation. Milrinone was weaned off and he was discharged home.    Admitted 08/01/18-08/07/18 with volume overload and left foot wound. Diuresed 38 lbs with IV lasix and then transitioned back to torsemide 100 mg BID. ID was consulted with concerns for left foot osteomyelitis noted on CT scan. He got IV ancef q8  hours x 42 days. [facility]HC provided teaching and supplies for home antibiotics. Course complicated by AKI. DC weight 200 lbs.    He went into atrial flutter and had DCCV to NSR in 1/20.    Echo in 4/21 showed EF 25% with apical akinesis and diffuse hypokinesis relatively preserved in lateral wall, mildly decreased RV systolic function, PASP 41 mmHg. CPX in 5/21 showed severe functional limitation suggestive of advanced HF. 5/21 Zio patch showed short atrial flutter runs, 3.8% PVCs.    He developed COVID-19 infection in 1/22.  Symptoms were predominantly GI.    He developed progressive worsening of CHF in early 2022 and was admitted in 3/22 with low output HF.  Impella 5.5 was placed as bridge to Heartmate III LVAD placed in 10/13/20.  He developed post-op renal failure and ended up on dialysis.  He was markedly deconditioned post-op and went to CIR.    Still has pain and minimal function in his right hand.  Nerve conduction study showed a chronic sensorimotor neuropathy in the right arm.    Ramp echo today showed severe LV dysfunction with mild RV enlargement and mild dysfunction, mild-moderate AI.  Speed was increased to 5800 rpm.  The aortic valve still opened with every beat but only slightly. Flow increased from 4.1 to 5.3 L/min and PI remained stable.   Patient returns today for followup of CHF/LVAD.  He is gradually getting stronger. Weight is stable.  He can walk up 2 flights of stairs to his apartment without dyspnea now.  He can walk for 10 minutes without dyspnea.  No orthopnea/PND.  No lightheadedness.  He has not been having any trouble with dialysis. MAP stable.  He has finished course of doxycycline for his driveline.   I reviewed the LVAD parameters from today, and compared the results to the patient's prior recorded data.  No programming changes were made.  The LVAD is functioning within specified parameters.  The patient performs LVAD self-test daily.  LVAD interrogation was negative for  any significant power changes, alarms or PI events/speed drops.  LVAD equipment check completed  and is in good working order.  Back-up equipment present.   LVAD education done on emergency procedures and precautions and reviewed exit site care.  Labs (5/22): LDH 174, hgb 10.6 Labs (7/22): LDH 164 Labs (8/22): hgb 11.4 Labs (10/22): hgb 12 Labs (11/22): hgb 12.2   Past Medical History:  Diagnosis Date   AICD (automatic cardioverter/defibrillator) present    Single-chamber  implantable cardiac defibrillator - Medtronic   Atrial fibrillation ([place])    Cataract    Mixed form OU   CHF (congestive heart failure) ([place])    Chronic kidney disease    Chronic kidney disease (CKD), stage III (moderate) (HCC)    Chronic systolic heart failure ([place])    a. Echo 5/13: Mild LVE, mild LVH, EF 10%, anteroseptal, lateral, apical AK, mild MR, mild LAE, moderately reduced RVSF, mild RAE, PASP 60;  b. 07/2014 Echo: EF 20-2%, diff HK, AKI of antsep/apical/mid-apicalinferior, mod reduced RV.   Coronary artery disease    a. s/p CABG 2002 b. LHC 5/13:  dLM 80%, LAD subtotally occluded, pCFX occluded, pRCA 50%, mid? Occlusion with high take off of the PDA with 70% multiple lesions-not bypassed and supplies collaterals to LAD, LIMA-IM/ramus ok, S-OM ok, S-PLA branches ok. Medical therapy was recommended   COVID    Diabetic retinopathy ([place])    NPDR OD, PDR OS   Dyspnea    Gout    "on daily RX" (01/08/2018)   Hypertension    Hypertensive retinopathy    OU   Ischemic cardiomyopathy    a. Echo 5/13: Mild LVE, mild LVH, EF 10%, anteroseptal, lateral, apical AK, mild MR, mild LAE, moderately reduced RVSF, mild RAE, PASP 60;  b. 01/2012 s/p MDT [id number] Protecta XT VR AICD;  c. 07/2014 Echo: EF 20-2%, diff HK, AKI of antsep/apical/mid-apicalinferior, mod reduced RV.   MRSA (methicillin resistant Staphylococcus aureus)    Status post right foot plantar deep infection with MRSA status post  I&D 10/2008   Myocardial  infarction [facility]    "was told I'd had several before heart OR in 2002" (01/08/2018)   Noncompliance    Peripheral neuropathy    Retinopathy, diabetic, background ([place])    Syncope    Type II diabetes mellitus ([place])    requiring insulin    Vitreous hemorrhage, left (HCC)    and proliferative diabetic retinopathy    Current Outpatient Medications  Medication Sig Dispense Refill   acetaminophen (TYLENOL) 325 MG tablet Take 1-2 tablets (325-650 mg total) by mouth every 4 (four) hours as needed for mild pain.     ascorbic acid (VITAMIN C) 1000 MG tablet Take 1 tablet (1,000 mg total) by mouth daily. 90 tablet 3   gabapentin (NEURONTIN) 100 MG capsule Take one tablet as needed 90 capsule 11   metoCLOPramide (REGLAN) 10 MG tablet Take 1 tablet (10 mg total) by mouth 3 (three) times daily before meals. 90 tablet 11   midodrine (PROAMATINE) 10 MG tablet Take 1 tablet (10 mg total) by mouth 3 (three) times daily. Three times a day on dialysis days; NO midodrine on NON dialysis days 90 tablet 6   multivitamin (RENA-VIT) TABS tablet Take 1 tablet by mouth at bedtime. 90 tablet 3   pantoprazole (PROTONIX) 40 MG tablet Take 1 tablet (40 mg total) by mouth daily. 90 tablet 3   polyethylene glycol powder (GLYCOLAX/MIRALAX) 17 GM/SCOOP powder Dissolve 1 capful in water and drink 2 (two) times daily. 510 g 0   rosuvastatin (CRESTOR) 10 MG tablet Take  1 tablet (10 mg total) by mouth daily. 30 tablet 11   traMADol (ULTRAM) 50 MG tablet Take 1 tablet (50 mg total) by mouth every 8 (eight) hours as needed. 30 tablet 0   warfarin (COUMADIN) 3 MG tablet Take 6 mg (3 mg x 2) every Tue; 4.5 mg (3 mg x 1.5) all other days, or as directed by HF Clinic 250 tablet 3   doxycycline (VIBRAMYCIN) 50 MG capsule Take 2 capsules (100 mg total) by mouth 2 (two) times daily for 10 days. (Patient not taking: Reported on 10/12/2021) 40 capsule 0   insulin glargine (LANTUS) 100 UNIT/ML injection Inject 0.08 mLs (8 Units total) into the  skin daily. Dx Code: E11.29 9 Pt is on sliding scale (Patient not taking: Reported on 10/12/2021) 10 mL 11   No current facility-administered medications for this encounter.    Meclizine hcl and Ivabradine  REVIEW OF SYSTEMS: All systems negative except as listed in HPI, PMH and Problem list.   LVAD INTERROGATION:  Please see LVAD nurse's note above.   BP 109/86 Comment: MAP 94   Pulse 92    Temp 98.2 F (36.8 C) (Oral)    Ht '5\' 10"'$  (1.778 m)    Wt 72.8 kg (160 lb 6.4 oz)    SpO2 100%    BMI 23.02 kg/m  MAP 94  Physical Exam: General: Well appearing this am. NAD.  HEENT: Normal. Neck: Supple, JVP 10 cm. Carotids OK.  Cardiac:  Mechanical heart sounds with LVAD hum present.  Lungs:  CTAB, normal effort.  Abdomen:  NT, ND, no HSM. No bruits or masses. +BS  LVAD exit site: Well-healed and incorporated. Dressing dry and intact. No erythema or drainage. Stabilization device present and accurately applied. Driveline dressing changed daily per sterile technique. Extremities:  Warm and dry. No cyanosis, clubbing, rash, or edema.  Neuro:  Alert & oriented x 3. Cranial nerves grossly intact. Moves all 4 extremities w/o difficulty. Affect pleasant    ASSESSMENT AND PLAN:  1. Chronic systolic CHF:  Ischemic cardiomyopathy. Has Medtronic ICD. Low output HF, admitted and Impella 5.5 placed 10/04/20.  HM3 LVAD was placed on 10/13/20. Developed post-op renal failure requiring eventual hemodialysis.  Ramp echo today, speed increased to 5800 rpm under echo guidance with increase in flow and stable PI.  MAP stable.  Mild volume overload on exam.  NYHA class II.  No recent orthostatic symptoms.  - Continue midodrine to 10 mg tid on HD days.   2. LVAD:  VAD interrogated personally.  As above, ramp echo done with increase in speed. Parameters stable with rare PI events.  - He is now off ASA.  - Continue warfarin with INR goal 2-2.5.   - Check LDH.  3. CAD s/p CABG 2002:  Last cath in 6/19 with patent  grafts. No chest pain.  - Continue Crestor.  4. ESRD: Currently dialyzing via tunneled catheter. No low flow events.  - Continue HD M-W-F  - With continuous flow LVAD, think AV fistula would be unlikely to mature.   5. Atrial flutter/fibrillation: s/p DC-CV 08/21/18.   6. 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  - Continue gabapentin 7. DM: Per primary care.  8. Anemia: Hgb stable on recent labs.   9. Suspected Gastroparesis. Nausea improved on Reglan.   Followup in 2 months  Loralie Champagne 10/12/2021

## 2021-10-12 NOTE — Progress Notes (Signed)
?  Echocardiogram ?2D Echocardiogram has been performed. ? ?Marcus Johnson ?10/12/2021, 10:18 AM ?

## 2021-10-13 DIAGNOSIS — Z992 Dependence on renal dialysis: Secondary | ICD-10-CM | POA: Diagnosis not present

## 2021-10-13 DIAGNOSIS — N186 End stage renal disease: Secondary | ICD-10-CM | POA: Diagnosis not present

## 2021-10-13 DIAGNOSIS — N2581 Secondary hyperparathyroidism of renal origin: Secondary | ICD-10-CM | POA: Diagnosis not present

## 2021-10-16 DIAGNOSIS — N2581 Secondary hyperparathyroidism of renal origin: Secondary | ICD-10-CM | POA: Diagnosis not present

## 2021-10-16 DIAGNOSIS — Z992 Dependence on renal dialysis: Secondary | ICD-10-CM | POA: Diagnosis not present

## 2021-10-16 DIAGNOSIS — N186 End stage renal disease: Secondary | ICD-10-CM | POA: Diagnosis not present

## 2021-10-17 ENCOUNTER — Ambulatory Visit (HOSPITAL_COMMUNITY): Payer: Self-pay | Admitting: Pharmacist

## 2021-10-17 LAB — POCT INR: INR: 2.8 (ref 2.0–3.0)

## 2021-10-17 NOTE — Progress Notes (Signed)
LVAD INR 

## 2021-10-18 ENCOUNTER — Other Ambulatory Visit (HOSPITAL_COMMUNITY): Payer: Self-pay | Admitting: Cardiology

## 2021-10-18 DIAGNOSIS — N186 End stage renal disease: Secondary | ICD-10-CM | POA: Diagnosis not present

## 2021-10-18 DIAGNOSIS — N2581 Secondary hyperparathyroidism of renal origin: Secondary | ICD-10-CM | POA: Diagnosis not present

## 2021-10-18 DIAGNOSIS — Z992 Dependence on renal dialysis: Secondary | ICD-10-CM | POA: Diagnosis not present

## 2021-10-20 DIAGNOSIS — N2581 Secondary hyperparathyroidism of renal origin: Secondary | ICD-10-CM | POA: Diagnosis not present

## 2021-10-20 DIAGNOSIS — N186 End stage renal disease: Secondary | ICD-10-CM | POA: Diagnosis not present

## 2021-10-20 DIAGNOSIS — Z992 Dependence on renal dialysis: Secondary | ICD-10-CM | POA: Diagnosis not present

## 2021-10-23 DIAGNOSIS — N2581 Secondary hyperparathyroidism of renal origin: Secondary | ICD-10-CM | POA: Diagnosis not present

## 2021-10-23 DIAGNOSIS — N186 End stage renal disease: Secondary | ICD-10-CM | POA: Diagnosis not present

## 2021-10-23 DIAGNOSIS — Z992 Dependence on renal dialysis: Secondary | ICD-10-CM | POA: Diagnosis not present

## 2021-10-23 NOTE — Progress Notes (Signed)
Remote ICD transmission.   

## 2021-10-24 ENCOUNTER — Ambulatory Visit (HOSPITAL_COMMUNITY): Payer: Self-pay | Admitting: Pharmacist

## 2021-10-24 DIAGNOSIS — Z7901 Long term (current) use of anticoagulants: Secondary | ICD-10-CM | POA: Diagnosis not present

## 2021-10-24 LAB — POCT INR: INR: 1.8 — AB (ref 2.0–3.0)

## 2021-10-24 NOTE — Progress Notes (Signed)
LVAD INR 

## 2021-10-25 DIAGNOSIS — Z992 Dependence on renal dialysis: Secondary | ICD-10-CM | POA: Diagnosis not present

## 2021-10-25 DIAGNOSIS — N186 End stage renal disease: Secondary | ICD-10-CM | POA: Diagnosis not present

## 2021-10-25 DIAGNOSIS — N2581 Secondary hyperparathyroidism of renal origin: Secondary | ICD-10-CM | POA: Diagnosis not present

## 2021-10-27 DIAGNOSIS — N186 End stage renal disease: Secondary | ICD-10-CM | POA: Diagnosis not present

## 2021-10-27 DIAGNOSIS — Z992 Dependence on renal dialysis: Secondary | ICD-10-CM | POA: Diagnosis not present

## 2021-10-27 DIAGNOSIS — N2581 Secondary hyperparathyroidism of renal origin: Secondary | ICD-10-CM | POA: Diagnosis not present

## 2021-10-30 ENCOUNTER — Other Ambulatory Visit: Payer: Self-pay

## 2021-10-30 ENCOUNTER — Emergency Department (HOSPITAL_COMMUNITY): Payer: Medicare HMO

## 2021-10-30 ENCOUNTER — Inpatient Hospital Stay (HOSPITAL_COMMUNITY)
Admission: EM | Admit: 2021-10-30 | Discharge: 2021-11-08 | DRG: 683 | Disposition: A | Discharge status: Home Health | Payer: Medicare HMO | Attending: Cardiology | Admitting: Cardiology

## 2021-10-30 ENCOUNTER — Encounter (HOSPITAL_COMMUNITY): Payer: Self-pay

## 2021-10-30 DIAGNOSIS — I129 Hypertensive chronic kidney disease with stage 1 through stage 4 chronic kidney disease, or unspecified chronic kidney disease: Secondary | ICD-10-CM | POA: Diagnosis not present

## 2021-10-30 DIAGNOSIS — E113592 Type 2 diabetes mellitus with proliferative diabetic retinopathy without macular edema, left eye: Secondary | ICD-10-CM | POA: Diagnosis present

## 2021-10-30 DIAGNOSIS — I5020 Unspecified systolic (congestive) heart failure: Secondary | ICD-10-CM | POA: Diagnosis not present

## 2021-10-30 DIAGNOSIS — E1122 Type 2 diabetes mellitus with diabetic chronic kidney disease: Secondary | ICD-10-CM | POA: Diagnosis not present

## 2021-10-30 DIAGNOSIS — S199XXA Unspecified injury of neck, initial encounter: Secondary | ICD-10-CM | POA: Diagnosis not present

## 2021-10-30 DIAGNOSIS — I4892 Unspecified atrial flutter: Secondary | ICD-10-CM | POA: Diagnosis present

## 2021-10-30 DIAGNOSIS — S0990XA Unspecified injury of head, initial encounter: Secondary | ICD-10-CM | POA: Diagnosis not present

## 2021-10-30 DIAGNOSIS — Z794 Long term (current) use of insulin: Secondary | ICD-10-CM

## 2021-10-30 DIAGNOSIS — Z951 Presence of aortocoronary bypass graft: Secondary | ICD-10-CM | POA: Diagnosis not present

## 2021-10-30 DIAGNOSIS — E876 Hypokalemia: Secondary | ICD-10-CM | POA: Diagnosis present

## 2021-10-30 DIAGNOSIS — M7989 Other specified soft tissue disorders: Secondary | ICD-10-CM | POA: Diagnosis not present

## 2021-10-30 DIAGNOSIS — R42 Dizziness and giddiness: Secondary | ICD-10-CM | POA: Diagnosis not present

## 2021-10-30 DIAGNOSIS — I4891 Unspecified atrial fibrillation: Secondary | ICD-10-CM | POA: Diagnosis present

## 2021-10-30 DIAGNOSIS — N179 Acute kidney failure, unspecified: Principal | ICD-10-CM | POA: Diagnosis present

## 2021-10-30 DIAGNOSIS — Y92009 Unspecified place in unspecified non-institutional (private) residence as the place of occurrence of the external cause: Secondary | ICD-10-CM | POA: Diagnosis not present

## 2021-10-30 DIAGNOSIS — E113291 Type 2 diabetes mellitus with mild nonproliferative diabetic retinopathy without macular edema, right eye: Secondary | ICD-10-CM | POA: Diagnosis present

## 2021-10-30 DIAGNOSIS — Z95811 Presence of heart assist device: Secondary | ICD-10-CM | POA: Diagnosis not present

## 2021-10-30 DIAGNOSIS — Z992 Dependence on renal dialysis: Secondary | ICD-10-CM | POA: Diagnosis not present

## 2021-10-30 DIAGNOSIS — S92355A Nondisplaced fracture of fifth metatarsal bone, left foot, initial encounter for closed fracture: Secondary | ICD-10-CM | POA: Diagnosis present

## 2021-10-30 DIAGNOSIS — R110 Nausea: Secondary | ICD-10-CM | POA: Diagnosis present

## 2021-10-30 DIAGNOSIS — N186 End stage renal disease: Secondary | ICD-10-CM | POA: Diagnosis not present

## 2021-10-30 DIAGNOSIS — S92352A Displaced fracture of fifth metatarsal bone, left foot, initial encounter for closed fracture: Secondary | ICD-10-CM | POA: Diagnosis not present

## 2021-10-30 DIAGNOSIS — Z8616 Personal history of COVID-19: Secondary | ICD-10-CM | POA: Diagnosis not present

## 2021-10-30 DIAGNOSIS — I959 Hypotension, unspecified: Secondary | ICD-10-CM | POA: Diagnosis present

## 2021-10-30 DIAGNOSIS — S42272A Torus fracture of upper end of left humerus, initial encounter for closed fracture: Secondary | ICD-10-CM | POA: Diagnosis not present

## 2021-10-30 DIAGNOSIS — I5022 Chronic systolic (congestive) heart failure: Secondary | ICD-10-CM

## 2021-10-30 DIAGNOSIS — I2510 Atherosclerotic heart disease of native coronary artery without angina pectoris: Secondary | ICD-10-CM | POA: Diagnosis present

## 2021-10-30 DIAGNOSIS — I255 Ischemic cardiomyopathy: Secondary | ICD-10-CM | POA: Diagnosis present

## 2021-10-30 DIAGNOSIS — N189 Chronic kidney disease, unspecified: Secondary | ICD-10-CM | POA: Diagnosis not present

## 2021-10-30 DIAGNOSIS — Z95810 Presence of automatic (implantable) cardiac defibrillator: Secondary | ICD-10-CM

## 2021-10-30 DIAGNOSIS — K3184 Gastroparesis: Secondary | ICD-10-CM

## 2021-10-30 DIAGNOSIS — Z8249 Family history of ischemic heart disease and other diseases of the circulatory system: Secondary | ICD-10-CM

## 2021-10-30 DIAGNOSIS — I951 Orthostatic hypotension: Secondary | ICD-10-CM

## 2021-10-30 DIAGNOSIS — W19XXXA Unspecified fall, initial encounter: Secondary | ICD-10-CM | POA: Diagnosis not present

## 2021-10-30 DIAGNOSIS — I252 Old myocardial infarction: Secondary | ICD-10-CM

## 2021-10-30 DIAGNOSIS — R569 Unspecified convulsions: Secondary | ICD-10-CM | POA: Diagnosis present

## 2021-10-30 DIAGNOSIS — M109 Gout, unspecified: Secondary | ICD-10-CM | POA: Diagnosis present

## 2021-10-30 DIAGNOSIS — D631 Anemia in chronic kidney disease: Secondary | ICD-10-CM | POA: Diagnosis not present

## 2021-10-30 DIAGNOSIS — R296 Repeated falls: Secondary | ICD-10-CM | POA: Diagnosis present

## 2021-10-30 DIAGNOSIS — Z888 Allergy status to other drugs, medicaments and biological substances status: Secondary | ICD-10-CM

## 2021-10-30 DIAGNOSIS — Z7901 Long term (current) use of anticoagulants: Secondary | ICD-10-CM

## 2021-10-30 DIAGNOSIS — G629 Polyneuropathy, unspecified: Secondary | ICD-10-CM | POA: Diagnosis present

## 2021-10-30 DIAGNOSIS — R55 Syncope and collapse: Secondary | ICD-10-CM | POA: Diagnosis not present

## 2021-10-30 DIAGNOSIS — N2581 Secondary hyperparathyroidism of renal origin: Secondary | ICD-10-CM | POA: Diagnosis present

## 2021-10-30 DIAGNOSIS — I509 Heart failure, unspecified: Secondary | ICD-10-CM | POA: Diagnosis not present

## 2021-10-30 DIAGNOSIS — Z20822 Contact with and (suspected) exposure to covid-19: Secondary | ICD-10-CM | POA: Diagnosis not present

## 2021-10-30 DIAGNOSIS — Z833 Family history of diabetes mellitus: Secondary | ICD-10-CM

## 2021-10-30 DIAGNOSIS — M25562 Pain in left knee: Secondary | ICD-10-CM | POA: Diagnosis not present

## 2021-10-30 DIAGNOSIS — M898X9 Other specified disorders of bone, unspecified site: Secondary | ICD-10-CM | POA: Diagnosis present

## 2021-10-30 DIAGNOSIS — Z79899 Other long term (current) drug therapy: Secondary | ICD-10-CM

## 2021-10-30 DIAGNOSIS — N250 Renal osteodystrophy: Secondary | ICD-10-CM | POA: Diagnosis not present

## 2021-10-30 DIAGNOSIS — I132 Hypertensive heart and chronic kidney disease with heart failure and with stage 5 chronic kidney disease, or end stage renal disease: Secondary | ICD-10-CM | POA: Diagnosis present

## 2021-10-30 LAB — I-STAT CHEM 8, ED
BUN: 65 mg/dL — ABNORMAL HIGH (ref 6–20)
Calcium, Ion: 1.01 mmol/L — ABNORMAL LOW (ref 1.15–1.40)
Chloride: 103 mmol/L (ref 98–111)
Creatinine, Ser: 8.1 mg/dL — ABNORMAL HIGH (ref 0.61–1.24)
Glucose, Bld: 113 mg/dL — ABNORMAL HIGH (ref 70–99)
HCT: 34 % — ABNORMAL LOW (ref 39.0–52.0)
Hemoglobin: 11.6 g/dL — ABNORMAL LOW (ref 13.0–17.0)
Potassium: 3.9 mmol/L (ref 3.5–5.1)
Sodium: 137 mmol/L (ref 135–145)
TCO2: 27 mmol/L (ref 22–32)

## 2021-10-30 LAB — TROPONIN I (HIGH SENSITIVITY)
Troponin I (High Sensitivity): 463 ng/L (ref <18)
Troponin I (High Sensitivity): 1274 ng/L (ref <18)

## 2021-10-30 LAB — RESP PANEL BY RT-PCR (FLU A&B, COVID) ARPGX2
Influenza A by PCR: NEGATIVE
Influenza B by PCR: NEGATIVE
SARS Coronavirus 2 by RT PCR: NEGATIVE

## 2021-10-30 LAB — CBC
HCT: 33.8 % — ABNORMAL LOW (ref 39.0–52.0)
Hemoglobin: 10.8 g/dL — ABNORMAL LOW (ref 13.0–17.0)
MCH: 29.5 pg (ref 26.0–34.0)
MCHC: 32 g/dL (ref 30.0–36.0)
MCV: 92.3 fL (ref 80.0–100.0)
Platelets: 133 10*3/uL — ABNORMAL LOW (ref 150–400)
RBC: 3.66 MIL/uL — ABNORMAL LOW (ref 4.22–5.81)
RDW: 13.3 % (ref 11.5–15.5)
WBC: 8.1 10*3/uL (ref 4.0–10.5)
nRBC: 0 % (ref 0.0–0.2)

## 2021-10-30 LAB — COMPREHENSIVE METABOLIC PANEL
ALT: 13 U/L (ref 0–44)
AST: 28 U/L (ref 15–41)
Albumin: 3.6 g/dL (ref 3.5–5.0)
Alkaline Phosphatase: 95 U/L (ref 38–126)
Anion gap: 14 (ref 5–15)
BUN: 68 mg/dL — ABNORMAL HIGH (ref 6–20)
CO2: 24 mmol/L (ref 22–32)
Calcium: 9.7 mg/dL (ref 8.9–10.3)
Chloride: 102 mmol/L (ref 98–111)
Creatinine, Ser: 7.7 mg/dL — ABNORMAL HIGH (ref 0.61–1.24)
GFR, Estimated: 7 mL/min — ABNORMAL LOW (ref >60)
Glucose, Bld: 110 mg/dL — ABNORMAL HIGH (ref 70–99)
Potassium: 4 mmol/L (ref 3.5–5.1)
Sodium: 140 mmol/L (ref 135–145)
Total Bilirubin: 1.1 mg/dL (ref 0.3–1.2)
Total Protein: 7.9 g/dL (ref 6.5–8.1)

## 2021-10-30 LAB — CBG MONITORING, ED: Glucose-Capillary: 92 mg/dL (ref 70–99)

## 2021-10-30 LAB — PROTIME-INR
INR: 1.8 — ABNORMAL HIGH (ref 0.8–1.2)
INR: 1.8 — ABNORMAL HIGH (ref 0.8–1.2)
Prothrombin Time: 20.8 seconds — ABNORMAL HIGH (ref 11.4–15.2)
Prothrombin Time: 21.1 seconds — ABNORMAL HIGH (ref 11.4–15.2)

## 2021-10-30 LAB — HEMOGLOBIN A1C
Hgb A1c MFr Bld: 5.5 % (ref 4.8–5.6)
Mean Plasma Glucose: 111.15 mg/dL

## 2021-10-30 LAB — LACTATE DEHYDROGENASE: LDH: 177 U/L (ref 98–192)

## 2021-10-30 LAB — GLUCOSE, CAPILLARY: Glucose-Capillary: 75 mg/dL (ref 70–99)

## 2021-10-30 MED ORDER — ACETAMINOPHEN 325 MG PO TABS
650.0000 mg | ORAL_TABLET | ORAL | Status: DC | PRN
  Administered 2021-10-31: 650 mg via ORAL
  Filled 2021-10-30: qty 2

## 2021-10-30 MED ORDER — WARFARIN SODIUM 5 MG PO TABS
5.0000 mg | ORAL_TABLET | Freq: Once | ORAL | Status: DC
  Filled 2021-10-30: qty 1

## 2021-10-30 MED ORDER — INSULIN ASPART 100 UNIT/ML IJ SOLN: 0.0000 [IU] | Freq: Every day | INTRAMUSCULAR | Status: DC

## 2021-10-30 MED ORDER — INSULIN ASPART 100 UNIT/ML IJ SOLN
0.0000 [IU] | Freq: Three times a day (TID) | INTRAMUSCULAR | Status: DC
  Administered 2021-11-03: 1 [IU] via SUBCUTANEOUS

## 2021-10-30 MED ORDER — VANCOMYCIN HCL 1500 MG/300ML IV SOLN
1500.0000 mg | Freq: Once | INTRAVENOUS | Status: AC
  Administered 2021-10-30: 1500 mg via INTRAVENOUS
  Filled 2021-10-30: qty 300

## 2021-10-30 MED ORDER — WARFARIN - PHARMACIST DOSING INPATIENT
Freq: Every day | Status: DC
Start: 2021-10-30 — End: 2021-11-08

## 2021-10-30 MED ORDER — CHLORHEXIDINE GLUCONATE CLOTH 2 % EX PADS
6.0000 | MEDICATED_PAD | Freq: Every day | CUTANEOUS | Status: DC
  Administered 2021-10-31 – 2021-11-08 (×9): 6 via TOPICAL

## 2021-10-30 MED ORDER — GABAPENTIN 100 MG PO CAPS
200.0000 mg | ORAL_CAPSULE | Freq: Every day | ORAL | Status: DC
  Administered 2021-10-30 – 2021-11-07 (×9): 200 mg via ORAL
  Filled 2021-10-30 (×9): qty 2

## 2021-10-30 MED ORDER — GABAPENTIN 100 MG PO CAPS
100.0000 mg | ORAL_CAPSULE | Freq: Every day | ORAL | Status: DC
  Administered 2021-10-31 – 2021-11-08 (×9): 100 mg via ORAL
  Filled 2021-10-30 (×9): qty 1

## 2021-10-30 MED ORDER — VANCOMYCIN HCL 750 MG/150ML IV SOLN
750.0000 mg | INTRAVENOUS | Status: DC
  Administered 2021-11-01 – 2021-11-06 (×3): 750 mg via INTRAVENOUS
  Filled 2021-10-30 (×7): qty 150

## 2021-10-30 MED ORDER — ONDANSETRON HCL 4 MG/2ML IJ SOLN: 4.0000 mg | Freq: Four times a day (QID) | INTRAMUSCULAR | Status: DC | PRN

## 2021-10-30 MED ORDER — MIDODRINE HCL 5 MG PO TABS
10.0000 mg | ORAL_TABLET | ORAL | Status: DC
  Administered 2021-10-30: 10 mg via ORAL
  Filled 2021-10-30: qty 2

## 2021-10-30 NOTE — ED Provider Notes (Signed)
?[facility] ?Provider Note ? ? ?CSN: [id number] ?Arrival date & time: 10/30/21  3785 ? ?  ? ?History ? ?Chief Complaint  ?Patient presents with  ? Fall  ? ? ?Marcus Johnson. is a 61 y.o. male. ? ?HPI ?61 year old male presents today complaining of syncope and weakness.  Patient had his usual dialysis on Friday.  His son is at the bedside giving additional history.  He states is not unusual for him to be weak immediately after dialysis.  On Friday as they walked him back up he kept having some shaking motions and collapsing.  They feel he would have fallen to the ground having not been holding him up.  This happened multiple times.  They got him into his bed and he seemed to have a normal night.  Saturday morning, he continued to feel weak and was unable to walk to the bathroom.  He had a syncopal episode.  They found him on the floor.  He did hurt his right foot.  He then ate in bed and has essentially been in bed or only up with assistance since then.  He denies any headache.  He struck his head after his son helped him to the bathroom yesterday.  He had a shaking episode that lasted 30 seconds while he was on the commode and during that time he struck his head on the wall.  He was in immediately awake and alert afterwards.  They deny any history of seizures and do not describe any postictal events.  He has an abrasion and skin tear to the dorsal aspect of the left foot that occurred on the fall on Saturday.  This was cleaned and bandaged by family member. ?Patient denies headache, head injury, fever, chills, chest pain, dyspnea, abdominal pain, nausea, vomiting, or diarrhea. ?He had COVID and has had 2 of his COVID vaccines. ?  ? ?Home Medications ?Prior to Admission medications   ?Medication Sig Start Date End Date Taking? Authorizing Provider  ?ascorbic acid (VITAMIN C) 1000 MG tablet Take 1 tablet (1,000 mg total) by mouth daily. 01/03/21  Yes Larey Dresser, MD   ?gabapentin (NEURONTIN) 100 MG capsule Take one tablet as needed ?Patient taking differently: Take 100-200 mg by mouth See admin instructions. Taking 1 capsule ( 100 mg) in the AM and 2 capsules ( 200 mg) at bedtime 08/10/21  Yes Larey Dresser, MD  ?insulin glargine (LANTUS) 100 UNIT/ML injection Inject 0.08 mLs (8 Units total) into the skin daily. Dx Code: E11.29 9 Pt is on sliding scale ?Patient taking differently: Inject 8 Units into the skin daily as needed (CBG > 120). Dx Code: E11.29 9 Pt is on sliding scale > CBG 120 05/30/21  Yes [name], MD  ?midodrine (PROAMATINE) 10 MG tablet Take 1 tablet (10 mg total) by mouth 3 (three) times daily. Three times a day on dialysis days; NO midodrine on NON dialysis days ?Patient taking differently: Take 10 mg by mouth 3 (three) times daily. Three times a day on dialysis days; Mon, Wed, Friday   NO midodrine on NON dialysis days 06/27/21  Yes Bowlus, Maricela Bo, FNP  ?multivitamin (RENA-VIT) TABS tablet Take 1 tablet by mouth at bedtime. 01/03/21  Yes Larey Dresser, MD  ?pantoprazole (PROTONIX) 40 MG tablet Take 1 tablet (40 mg total) by mouth daily. 01/03/21  Yes Larey Dresser, MD  ?polyethylene glycol powder (GLYCOLAX/MIRALAX) 17 GM/SCOOP powder Dissolve 1 capful in water and drink  2 (two) times daily. ?Patient taking differently: Take 17 g by mouth 2 (two) times daily as needed for mild constipation. 12/09/20  Yes Love, Ivan Anchors, PA-C  ?rosuvastatin (CRESTOR) 10 MG tablet Take 1 tablet (10 mg total) by mouth daily. 12/27/20  Yes [name], MD  ?traMADol (ULTRAM) 50 MG tablet Take 1 tablet (50 mg total) by mouth every 8 (eight) hours as needed. ?Patient taking differently: Take 50 mg by mouth every 8 (eight) hours as needed for moderate pain. 10/02/21  Yes Larey Dresser, MD  ?warfarin (COUMADIN) 3 MG tablet Take 6 mg (3 mg x 2) every Tue; 4.5 mg (3 mg x 1.5) all other days, or as directed by HF Clinic ?Patient taking differently: Take 4.5 mg by mouth  daily at 4 PM. , or as directed by HF Clinic 08/15/21  Yes Larey Dresser, MD  ?   ? ?Allergies    ?Meclizine hcl and Ivabradine   ? ?Review of Systems   ?Review of Systems ? ?Physical Exam ?Updated Vital Signs ?Pulse 86   Resp 15   Ht 1.791 m (5' 10.5")   Wt 71.7 kg   SpO2 100%   BMI 22.35 kg/m?  ?Physical Exam ?Vitals and nursing note reviewed.  ?Constitutional:   ?   General: He is not in acute distress. ?   Appearance: Normal appearance. He is not ill-appearing.  ?HENT:  ?   Head: Normocephalic and atraumatic.  ?   Right Ear: External ear normal.  ?   Left Ear: External ear normal.  ?   Nose: Nose normal.  ?   Mouth/Throat:  ?   Pharynx: Oropharynx is clear.  ?Eyes:  ?   Pupils: Pupils are equal, round, and reactive to light.  ?   Comments: Right subconjunctival hemorrhage  ?Cardiovascular:  ?   Rate and Rhythm: Normal rate and regular rhythm.  ?   Pulses: Normal pulses.  ?Pulmonary:  ?   Effort: Pulmonary effort is normal.  ?   Breath sounds: Normal breath sounds.  ?Abdominal:  ?   General: Abdomen is flat. Bowel sounds are normal.  ?   Palpations: Abdomen is soft.  ?Musculoskeletal:     ?   General: Normal range of motion.  ?   Cervical back: Normal range of motion and neck supple.  ?Skin: ?   General: Skin is warm.  ?   Capillary Refill: Capillary refill takes less than 2 seconds.  ?   Comments: Some mild tenderness to palpation of her left knee and left foot  ?Neurological:  ?   General: No focal deficit present.  ?   Mental Status: He is alert.  ?Psychiatric:     ?   Mood and Affect: Mood normal.     ?   Behavior: Behavior normal.  ? ? ?ED Results / Procedures / Treatments   ?Labs ?(all labs ordered are listed, but only abnormal results are displayed) ?Labs Reviewed  ?PROTIME-INR - Abnormal; Notable for the following components:  ?    Result Value  ? Prothrombin Time 20.8 (*)   ? INR 1.8 (*)   ? All other components within normal limits  ?CBC - Abnormal; Notable for the following components:  ? RBC  3.66 (*)   ? Hemoglobin 10.8 (*)   ? HCT 33.8 (*)   ? Platelets 133 (*)   ? All other components within normal limits  ?COMPREHENSIVE METABOLIC PANEL - Abnormal; Notable for the following components:  ?  Glucose, Bld 110 (*)   ? BUN 68 (*)   ? Creatinine, Ser 7.70 (*)   ? GFR, Estimated 7 (*)   ? All other components within normal limits  ?I-STAT CHEM 8, ED - Abnormal; Notable for the following components:  ? BUN 65 (*)   ? Creatinine, Ser 8.10 (*)   ? Glucose, Bld 113 (*)   ? Calcium, Ion 1.01 (*)   ? Hemoglobin 11.6 (*)   ? HCT 34.0 (*)   ? All other components within normal limits  ?RESP PANEL BY RT-PCR (FLU A&B, COVID) [id number]  ?PROTIME-INR  ?LACTATE DEHYDROGENASE  ?HEMOGLOBIN A1C  ?TROPONIN I (HIGH SENSITIVITY)  ? ? ?EKG ?EKG Interpretation ? ?Date/Time:  Monday October 30 2021 09:31:24 EDT ?Ventricular Rate:  89 ?PR Interval:  234 ?QRS Duration: 97 ?QT Interval:  343 ?QTC Calculation: 418 ?R Axis:   251 ?Text Interpretation: Poor quality data, interpretation may be affected Unknown rhythm, irregular rate Poor data quality in current ECG precludes serial comparison Confirmed by Pattricia Boss [phone]) on 10/30/2021 9:36:45 AM ? ?Radiology ?CT Head Wo Contrast ? ?Result Date: 10/30/2021 ?CLINICAL DATA:  Head trauma, moderate/severe. Neck trauma, impaired range of motion. Evaluate for injury status post fall. EXAM: CT HEAD WITHOUT CONTRAST CT CERVICAL SPINE WITHOUT CONTRAST TECHNIQUE: Multidetector CT imaging of the head and cervical spine was performed following the standard protocol without intravenous contrast. Multiplanar CT image reconstructions of the cervical spine were also generated. RADIATION DOSE REDUCTION: This exam was performed according to the departmental dose-optimization program which includes automated exposure control, adjustment of the mA and/or kV according to patient size and/or use of iterative reconstruction technique. COMPARISON:  Prior head CT examinations 09/19/2021 and earlier. FINDINGS: CT  HEAD FINDINGS Brain: Mild generalized cerebral and cerebellar atrophy. Mild to moderate patchy and ill-defined hypoattenuation within the cerebral white matter, nonspecific but compatible with chronic small vesse

## 2021-10-30 NOTE — Progress Notes (Addendum)
LVAD Coordinator ED Encounter ? ?Marcus Johnson. a 62 y.o. male that presented to [facility] ER today due to multiple falls over the weekend. He has a past medical history of AICD (automatic cardioverter/defibrillator) present, Atrial fibrillation ([place]), Cataract, CHF (congestive heart failure) ([place]), Chronic kidney disease, Chronic kidney disease (CKD), stage III (moderate) ([place]), Chronic systolic heart failure ([place]), Coronary artery disease, COVID, Diabetic retinopathy ([place]), Dyspnea, Gout, Hypertension, Hypertensive retinopathy, Ischemic cardiomyopathy, MRSA (methicillin resistant Staphylococcus aureus), Myocardial infarction ([place]), Noncompliance, Peripheral neuropathy, Retinopathy, diabetic, background ([place]), Syncope, Type II diabetes mellitus ([place]), and Vitreous hemorrhage, left ([place])..  ? ?LVAD is a HM3 and was implanted on 10/13/20 by [facility].  ? ?Pt paged the VAD pager this morning for multiple falls over the weekend. Pt tells me that he as "blacked out" several times. Pt is c/o pain in left leg, has conjunctival hemorrhage in Right eye. Pt has some drainage around his driveline-VAD coordinator changed-see full note below. ? ?Medtronic device interrogated in the ER. ? ? ?Vital signs: ?HR: 86 NSR ?Doppler MAP: 96 done personally ?Automated BP: 111/84 (94) ?O2 Sat: 100% RA ? ?Orthostatics performed by VAD coordinators in ER: ?Lying: ?HR: 96   Speed: 5800 ?BP: 117/104 (110) Flow: 5.1 ?   Power: 4.7 ?   PI: 3.2 ? ?Sitting:  ?HR: 95   Speed: 5800 ?BP: 103/85 (92) Flow: 5.2 ?   Power: 4.6 ?   PI: 3.3 ? ?Standing: ?HR: 94   Speed: 5800 ?BP: 113/110 (118) Flow: 5.2 ?   Power: 4.6 ?   PI: 3.2 ? ?LVAD interrogation reveals:  ?Speed: 5800 ?Flow: 5.1 ?Power:  4.6 ?PI: 3.3 ? ?Alarms: none ?Events: 40-50 daily ? ?Drive Line: Existing VAD dressing removed and site care performed using sterile technique. Drive line exit site cleaned with Chlora prep applicators x 2, RINSED W/SALINE, allowed to dry, and Sorbaview dressing with  Silverlon patch applied. Exit site healed and incorporated, the velour is fully implanted at exit site. Small amount of bloody drainage. No redness, tenderness, slight foul odor or rash noted. Drive line anchor re-applied. Continue weekly dressing changes, VAD coordinator will re-evaluate tomorrow. D/w Dr. Aundra Dubin that pt will need antibiotics for potential driveline infection.  ? ? ? ? ? ?Significant Events with LVAD: ?He developed progressive worsening of CHF in early 2022 and was admitted in 3/22 with low output HF.  Impella 5.5 was placed as bridge to Heartmate III LVAD placed in 10/13/20.  He developed post-op renal failure and ended up on dialysis.  He was markedly deconditioned post-op and went to CIR.   ? ?Updated VAD Providers Dr. Aundra Dubin and Marlyce Huge, PA about the above. No LVAD issues and pump is functioning as expected. Able to independently manage LVAD equipment. No LVAD needs at this time.  ? ? ?Tanda Rockers, RN ?VAD Coordinator  ? ?Office: [phone] ?24/7 Emergency VAD Pager: [phone] ? ?

## 2021-10-30 NOTE — ED Triage Notes (Signed)
Pt bib ems from home c/o fall (Warfarin blood thinners) on Saturday. Pt do not recall falling. He just recalls being out for a brief moment. Pt c/o left leg pain and laceration on left foot covered with bandaide.  ?Pt right eye is blood shot. ? ?Pt goes to dialysis  M/W/F ?

## 2021-10-30 NOTE — ED Notes (Signed)
Rapid Response' \\RN'$  Lisa notified Pt has LVAD ?

## 2021-10-30 NOTE — Progress Notes (Signed)
Pharmacy Antibiotic Note ? ?Marcus Johnson Wynne Jury. is a 61 y.o. male admitted on 10/30/2021 with  driveline infection .  Pharmacy has been consulted for vancomycin dosing. ? ?WBC 8.1, afebrile. Noted bloody drainage.  ? ?Plan: ?Vancomycin 1500 mg IV once then 750 mg IV qHD ?Monitor HD schedule, cx results, clinical pic, and vanc levels as appropriate ? ?Height: 5' 10.5" (179.1 cm) ?Weight: 71.7 kg (158 lb) ?IBW/kg (Calculated) : 74.15 ? ?No data recorded. ? ?Recent Labs  ?Lab 10/30/21 ?1046 10/30/21 ?1054  ?WBC 8.1  --   ?CREATININE  --  8.10*  ?  ?Estimated Creatinine Clearance: 9.8 mL/min (A) (by C-G formula based on SCr of 8.1 mg/dL (H)).   ? ?Allergies  ?Allergen Reactions  ? Meclizine Hcl Anaphylaxis and Swelling  ? Ivabradine Nausea Only  ? ? ?Antimicrobials this admission: ?Vancomycin 3/27 >>  ? ?Dose adjustments this admission: ?N/A ? ?Microbiology results: ?3/27 COVID/Flu PCR: neg ? ?Thank you for allowing pharmacy to be a part of this patient?s care. ? ?Antonietta Jewel, PharmD, BCCCP ?Clinical Pharmacist  ?Phone: [phone] ?10/30/2021 11:59 AM ? ?Please check AMION for all [facility] phone numbers ?After 10:00 PM, call [facility] [facility] [phone] ? ? ?
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
## 2021-10-30 NOTE — Progress Notes (Signed)
?  Transition of Care (TOC) Screening Note ? ? ?Patient Details  ?Name: [name]. ?Date of Birth: 05/07/1961 ? ? ?Transition of Care (TOC) CM/SW Contact:    ?Aundreya Souffrant, LCSW ?Phone Number: ?10/30/2021, 3:15 PM ? ? ? ?Transition of Care Department [facility]) has reviewed patient and no TOC needs have been identified at this time. We will continue to monitor patient advancement through interdisciplinary progression rounds. Patient will benefit from PT/OT consult for disposition recommendations. If new patient transition needs arise, please place a TOC consult. ?  ?

## 2021-10-30 NOTE — H&P (Addendum)
? ?Advanced Heart Failure Team History and Physical Note ?  ?PCP:  [name], MD  ?PCP-Cardiology: None    ? ?Reason for Admission: Recurrent falls, ? Seizure activity/syncopal episodes ? ? ?HPI:   ? ?Marcus Johnson. is a 61 y.o. male who has a h/o DM2, CAD s/p CABG 4481, systolic HF due to ischemic cardiomyopathy with EF 20-25% (echo 12/15), DM2 and CKD. He is s/p Medtronic single chamber ICD. ?  ?He was admitted in 6/19 with marked volume overload and dyspnea.  Echo EF 20% with severe global hypokinesis.  LHC/RHC showed severe native vessel CAD with patent LIMA-D, SVG-OM, and SVG-PLV; cardiac index low.  He was started on milrinone and diuresed.  He was adamant that he did not want to pursue LVAD workup yet and did not want a referral for transplant evaluation.  ?  ?Admitted 08/01/18-08/07/18 with volume overload and left foot wound. Diuresed 38 lbs with IV lasix and then transitioned back to torsemide 100 mg BID. ID was consulted with concerns for left foot osteomyelitis noted on CT scan. He got IV ancef q8 hours x 42 days. [facility] provided teaching and supplies for home antibiotics. Course complicated by AKI. DC weight 200 lbs.  ?  ?He went into atrial flutter and had DCCV to NSR in 1/20.  ?  ?He developed COVID-19 infection in 1/22.  Symptoms were predominantly GI.   ?  ?He developed progressive worsening of CHF in early 2022 and was admitted in 3/22 with low output HF.  Impella 5.5 was placed as bridge to Heartmate III LVAD placed in 10/13/20.  He developed post-op renal failure and ended up on dialysis.    ?  ?Last seen in VAD clinic for f/u 10/12/21. Had recently completed course of doxycycline for driveline infection. INR 1.8 on 03/21. Had not taken warfarin for 2 days d/t bleeding after dental procedure. Recent bleeding at driveline and R eye noted. ? ?Patient's son notes episodes of possible seizure activity at home beginning shortly after dialysis session on 03/24. Patient appeared to convulse  and stare into space for about 30 seconds during each episode. Episodes recurred several times over the weekend. He has been extremely weak and was in bed most of the weekend. Did not bite his tongue, no loss of bowels or bladder. Was awake and alert after the events. On 03/25 he had one episode while on the toilet. When he stood up, he fell and hit the left side of his head on a wall in the bathroom. Had multiple injuries including abrasian to left foot and left leg pain unable to bear weight. Denies any prior history of seizure activity. Has not missed any doses of medications recently.  ? ?VAD coordinator paged this am and he was referred to the ED for additional evaluation.  ? ? ?LVAD interrogation HM III: ?Speed 5200, Flow 4.5, Power 3.9, PI 3.4. > 50 PI events. No low flow alarms. ? ?Review of Systems: [y] = yes, '[ ]'$  = no  ? ?General: Weight gain '[ ]'$ ; Weight loss '[ ]'$ ; Anorexia '[ ]'$ ; Fatigue [Y]; Fever '[ ]'$ ; Chills '[ ]'$ ; Weakness [Y]  ?Cardiac: Chest pain/pressure '[ ]'$ ; Resting SOB '[ ]'$ ; Exertional SOB '[ ]'$ ; Orthopnea '[ ]'$ ; Pedal Edema '[ ]'$ ; Palpitations '[ ]'$ ; Syncope '[ ]'$ ; Presyncope '[ ]'$ ; Paroxysmal nocturnal dyspnea'[ ]'$   ?Pulmonary: Cough '[ ]'$ ; Wheezing'[ ]'$ ; Hemoptysis'[ ]'$ ; Sputum '[ ]'$ ; Snoring '[ ]'$   ?GI: Vomiting'[ ]'$ ; Dysphagia'[ ]'$ ; Melena'[ ]'$ ; Hematochezia '[ ]'$ ;  Heartburn'[ ]'$ ; Abdominal pain '[ ]'$ ; Constipation '[ ]'$ ; Diarrhea '[ ]'$ ; BRBPR '[ ]'$   ?GU: Hematuria'[ ]'$ ; Dysuria '[ ]'$ ; Nocturia'[ ]'$   ?Vascular: Pain in legs with walking '[ ]'$ ; Pain in feet with lying flat '[ ]'$ ; Non-healing sores '[ ]'$ ; Stroke '[ ]'$ ; TIA '[ ]'$ ; Slurred speech '[ ]'$ ;  ?Neuro: Headaches'[ ]'$ ; Vertigo'[ ]'$ ; Seizures'[ ]'$ ; Paresthesias'[ ]'$ ;Blurred vision '[ ]'$ ; Diplopia '[ ]'$ ; Vision changes '[ ]'$   ?Ortho/Skin: Arthritis '[ ]'$ ; Joint pain '[ ]'$ ; Muscle pain '[ ]'$ ; Joint swelling '[ ]'$ ; Back Pain '[ ]'$ ; Rash '[ ]'$   ?Psych: Depression'[ ]'$ ; Anxiety'[ ]'$   ?Heme: Bleeding problems '[ ]'$ ; Clotting disorders '[ ]'$ ; Anemia '[ ]'$   ?Endocrine: Diabetes [Y]; Thyroid dysfunction'[ ]'$  ? ? ?Home Medications ?Prior to Admission  medications   ?Medication Sig Start Date End Date Taking? Authorizing Provider  ?acetaminophen (TYLENOL) 325 MG tablet Take 1-2 tablets (325-650 mg total) by mouth every 4 (four) hours as needed for mild pain. 12/05/20   Bary Leriche, PA-C  ?ascorbic acid (VITAMIN C) 1000 MG tablet Take 1 tablet (1,000 mg total) by mouth daily. 01/03/21   Larey Dresser, MD  ?gabapentin (NEURONTIN) 100 MG capsule Take one tablet as needed 08/10/21   Larey Dresser, MD  ?insulin glargine (LANTUS) 100 UNIT/ML injection Inject 0.08 mLs (8 Units total) into the skin daily. Dx Code: E11.29 9 Pt is on sliding scale ?Patient not taking: Reported on 10/12/2021 05/30/21   [name], MD  ?metoCLOPramide (REGLAN) 10 MG tablet Take 1 tablet (10 mg total) by mouth 3 (three) times daily before meals. 12/27/20   [name], MD  ?midodrine (PROAMATINE) 10 MG tablet Take 1 tablet (10 mg total) by mouth 3 (three) times daily. Three times a day on dialysis days; NO midodrine on NON dialysis days 06/27/21   Rafael Bihari, FNP  ?multivitamin (RENA-VIT) TABS tablet Take 1 tablet by mouth at bedtime. 01/03/21   Larey Dresser, MD  ?pantoprazole (PROTONIX) 40 MG tablet Take 1 tablet (40 mg total) by mouth daily. 01/03/21   Larey Dresser, MD  ?polyethylene glycol powder (GLYCOLAX/MIRALAX) 17 GM/SCOOP powder Dissolve 1 capful in water and drink 2 (two) times daily. 12/09/20   Love, Ivan Anchors, PA-C  ?rosuvastatin (CRESTOR) 10 MG tablet Take 1 tablet (10 mg total) by mouth daily. 12/27/20   [name], MD  ?traMADol (ULTRAM) 50 MG tablet Take 1 tablet (50 mg total) by mouth every 8 (eight) hours as needed. 10/02/21   Larey Dresser, MD  ?warfarin (COUMADIN) 3 MG tablet Take 6 mg (3 mg x 2) every Tue; 4.5 mg (3 mg x 1.5) all other days, or as directed by HF Clinic 08/15/21   Larey Dresser, MD  ? ? ?Past Medical History: ?Past Medical History:  ?Diagnosis Date  ? AICD (automatic cardioverter/defibrillator) present   ? Single-chamber   implantable cardiac defibrillator - Medtronic  ? Atrial fibrillation ([place])   ? Cataract   ? Mixed form OU  ? CHF (congestive heart failure) ([place])   ? Chronic kidney disease   ? Chronic kidney disease (CKD), stage III (moderate) ([facility])   ? Chronic systolic heart failure ([place])   ? a. Echo 5/13: Mild LVE, mild LVH, EF 10%, anteroseptal, lateral, apical AK, mild MR, mild LAE, moderately reduced RVSF, mild RAE, PASP 60;  b. 07/2014 Echo: EF 20-2%, diff HK, AKI of antsep/apical/mid-apicalinferior, mod reduced RV.  ? Coronary artery disease   ? a.  s/p CABG 2002 b. LHC 5/13:  dLM 80%, LAD subtotally occluded, pCFX occluded, pRCA 50%, mid? Occlusion with high take off of the PDA with 70% multiple lesions-not bypassed and supplies collaterals to LAD, LIMA-IM/ramus ok, S-OM ok, S-PLA branches ok. Medical therapy was recommended  ? COVID   ? Diabetic retinopathy ([place])   ? NPDR OD, PDR OS  ? Dyspnea   ? Gout   ? "on daily RX" (01/08/2018)  ? Hypertension   ? Hypertensive retinopathy   ? OU  ? Ischemic cardiomyopathy   ? a. Echo 5/13: Mild LVE, mild LVH, EF 10%, anteroseptal, lateral, apical AK, mild MR, mild LAE, moderately reduced RVSF, mild RAE, PASP 60;  b. 01/2012 s/p MDT [id number] Protecta XT VR AICD;  c. 07/2014 Echo: EF 20-2%, diff HK, AKI of antsep/apical/mid-apicalinferior, mod reduced RV.  ? MRSA (methicillin resistant Staphylococcus aureus)   ? Status post right foot plantar deep infection with MRSA status post  I&D 10/2008  ? Myocardial infarction [facility])   ? "was told I'd had several before heart OR in 2002" (01/08/2018)  ? Noncompliance   ? Peripheral neuropathy   ? Retinopathy, diabetic, background ([place])   ? Syncope   ? Type II diabetes mellitus ([place])   ? requiring insulin   ? Vitreous hemorrhage, left (H[facility])   ? and proliferative diabetic retinopathy  ? ? ?Past Surgical History: ?Past Surgical History:  ?Procedure Laterality Date  ? CARDIAC CATHETERIZATION  2002  ? CARDIAC CATHETERIZATION N/A 01/18/2015  ? Procedure: Right  Heart Cath;  Surgeon: Larey Dresser, MD;  Location: [place] [facility];  Service: Cardiovascular;  Laterality: N/A;  ? CARDIOVERSION N/A 09/03/2018  ? Procedure: CARDIOVERSION;  Surgeon: Larey Dresser, MD;  Carlean Jews

## 2021-10-30 NOTE — ED Notes (Signed)
Critical Troponin reported from lab  463 ?

## 2021-10-30 NOTE — Consult Note (Addendum)
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 10/25/2021) -Venofer 50 mg IV weekly -No VDRA   Assessment/Plan:  Falls at home, Syncope vs Seizures: W/U per primary. Concerned that EDW may be too low. EDW was raised last week but order wasn't in place when he had HD 10/27/2021. Allow EDW to float up possibly around 71.5 kg.   Fx L foot- Ortho consult per primary HFrEF-in setting of ischemic cardiomyopathy. LVAD intact. Has been seen by VAD coordinator. Speed 5800 rpm. On coumadin .Per primary  ESRD -  MWF. Missed HD today. Will have HD today, 2.0 K bath, no heparin   Hypertension/volume  - Doppler BP 96. Volume appears stable, no edema by exam or CXR. As noted above, allow EDW to come up at least 1 kg with HD. Continue midodrine  10 mg PO  TID on MWF.    Anemia  - HGB 11.6. Recent OP ESA. Follow HGB.   Metabolic bone disease -  No binders, vdra on op med list. Ca+ OK.   Nutrition - Albumin  acceptable. Renal/Carb mod diet when able to eat. Add nepro.   DMT2-per primary CAD S/P CABG 2002 GOC-Limited Code.   Marcus Louvier H. Delores, NP-C 10/30/2021, 1:07 PM  [employer] [phone]

## 2021-10-30 NOTE — ED Notes (Signed)
The has a fluid filled pocket on his lower right shin. ?

## 2021-10-30 NOTE — ED Notes (Signed)
Ortho Tech educated pt on CAM Psychiatrist applied the boot. ?

## 2021-10-30 NOTE — Progress Notes (Signed)
ANTICOAGULATION CONSULT NOTE - Initial Consult ? ?Pharmacy Consult for warfarin ?Indication:  LVAD HM3 ? ?Allergies  ?Allergen Reactions  ? Meclizine Hcl Anaphylaxis and Swelling  ? Ivabradine Nausea Only  ? ? ?Patient Measurements: ?Height: 5' 10.5" (179.1 cm) ?Weight: 71.7 kg (158 lb) ?IBW/kg (Calculated) : 74.15 ?Heparin Dosing Weight: 71.7 kg ? ?Vital Signs: ?Pulse Rate: 86 (03/27 0930) ? ?Labs: ?Recent Labs  ?  10/30/21 ?1046 10/30/21 ?1054  ?HGB 10.8* 11.6*  ?HCT 33.8* 34.0*  ?PLT 133*  --   ?LABPROT 20.8*  --   ?INR 1.8*  --   ?CREATININE  --  8.10*  ? ? ?Estimated Creatinine Clearance: 9.8 mL/min (A) (by C-G formula based on SCr of 8.1 mg/dL (H)). ? ? ?Medical History: ?Past Medical History:  ?Diagnosis Date  ? AICD (automatic cardioverter/defibrillator) present   ? Single-chamber  implantable cardiac defibrillator - Medtronic  ? Atrial fibrillation ([place])   ? Cataract   ? Mixed form OU  ? CHF (congestive heart failure) ([place])   ? Chronic kidney disease   ? Chronic kidney disease (CKD), stage III (moderate) (HCC)   ? Chronic systolic heart failure ([place])   ? a. Echo 5/13: Mild LVE, mild LVH, EF 10%, anteroseptal, lateral, apical AK, mild MR, mild LAE, moderately reduced RVSF, mild RAE, PASP 60;  b. 07/2014 Echo: EF 20-2%, diff HK, AKI of antsep/apical/mid-apicalinferior, mod reduced RV.  ? Coronary artery disease   ? a. s/p CABG 2002 b. LHC 5/13:  dLM 80%, LAD subtotally occluded, pCFX occluded, pRCA 50%, mid? Occlusion with high take off of the PDA with 70% multiple lesions-not bypassed and supplies collaterals to LAD, LIMA-IM/ramus ok, S-OM ok, S-PLA branches ok. Medical therapy was recommended  ? COVID   ? Diabetic retinopathy ([place])   ? NPDR OD, PDR OS  ? Dyspnea   ? Gout   ? "on daily RX" (01/08/2018)  ? Hypertension   ? Hypertensive retinopathy   ? OU  ? Ischemic cardiomyopathy   ? a. Echo 5/13: Mild LVE, mild LVH, EF 10%, anteroseptal, lateral, apical AK, mild MR, mild LAE, moderately reduced RVSF, mild  RAE, PASP 60;  b. 01/2012 s/p MDT [id number] Protecta XT VR AICD;  c. 07/2014 Echo: EF 20-2%, diff HK, AKI of antsep/apical/mid-apicalinferior, mod reduced RV.  ? MRSA (methicillin resistant Staphylococcus aureus)   ? Status post right foot plantar deep infection with MRSA status post  I&D 10/2008  ? Myocardial infarction [facility])   ? "was told I'd had several before heart OR in 2002" (01/08/2018)  ? Noncompliance   ? Peripheral neuropathy   ? Retinopathy, diabetic, background ([place])   ? Syncope   ? Type II diabetes mellitus ([place])   ? requiring insulin   ? Vitreous hemorrhage, left (HCC)   ? and proliferative diabetic retinopathy  ? ? ?Medications:  ?Scheduled:  ? [START ON 10/31/2021] gabapentin  100 mg Oral Daily  ? gabapentin  200 mg Oral QHS  ? insulin aspart  0-5 Units Subcutaneous QHS  ? insulin aspart  0-9 Units Subcutaneous TID WC  ? midodrine  10 mg Oral 3 times per day on Mon Wed Fri  ? ? ?Assessment: ?80 yom presenting after fall, head CT negative. Was found to have mild displaced fractures at 5h metatarsal. On warfarin PTA for hx LVAD HM3 - LD 3/26'@1900'$ . ? ?INR on admission came back subtherapeutic at 1.8. Hgb 11.6, plt 133. No s/sx of bleeding.  ? ?PTA regimen 4.5 mg daily except 6 mg  on Tuesday. ? ?Goal of Therapy:  ?INR 2-2.5 ?Monitor platelets by anticoagulation protocol: Yes ?  ?Plan:  ?Warfarin 5 mg tonight ?Monitor daily INR, CBC, and for s/sx of bleeding  ? ?Antonietta Jewel, PharmD, BCCCP ?Clinical Pharmacist  ?Phone: [phone] ?10/30/2021 11:51 AM ? ?Please check AMION for all [place] phone numbers ?After 10:00 PM, call [place] [phone] ? ? ? ?

## 2021-10-30 NOTE — Procedures (Signed)
I was present at this dialysis session. I have reviewed the session itself and made appropriate changes.  ? ?No UF.  2K bath.  TDC.  T Oelrating well.  ? ?Filed Weights  ? 10/30/21 1021  ?Weight: 71.7 kg  ? ? ?Recent Labs  ?Lab 10/30/21 ?1046 10/30/21 ?1054  ?NA 140 137  ?K 4.0 3.9  ?CL 102 103  ?CO2 24  --   ?GLUCOSE 110* 113*  ?BUN 68* 65*  ?CREATININE 7.70* 8.10*  ?CALCIUM 9.7  --   ? ? ?Recent Labs  ?Lab 10/30/21 ?1046 10/30/21 ?1054  ?WBC 8.1  --   ?HGB 10.8* 11.6*  ?HCT 33.8* 34.0*  ?MCV 92.3  --   ?PLT 133*  --   ? ? ?Scheduled Meds: ? [START ON 10/31/2021] Chlorhexidine Gluconate Cloth  6 each Topical Q0600  ? [START ON 10/31/2021] gabapentin  100 mg Oral Daily  ? gabapentin  200 mg Oral QHS  ? insulin aspart  0-5 Units Subcutaneous QHS  ? insulin aspart  0-9 Units Subcutaneous TID WC  ? midodrine  10 mg Oral 3 times per day on Mon Wed Fri  ? warfarin  5 mg Oral ONCE-1600  ? Warfarin - Pharmacist Dosing Inpatient   Does not apply [id number]  ? ?Continuous Infusions: ? vancomycin 1,500 mg (10/30/21 1501)  ? vancomycin    ? ?PRN Meds:.acetaminophen, ondansetron (ZOFRAN) IV   ?Pearson Grippe  MD ?10/30/2021, 4:18 PM ?  ?

## 2021-10-30 NOTE — Progress Notes (Signed)
Orthopedic Tech Progress Note ?Patient Details:  ?Marcus Johnson. ?05/24/1961 ?[id number] ? ?Ortho Devices ?Type of Ortho Device: CAM walker ?Ortho Device/Splint Location: LLE ?Ortho Device/Splint Interventions: Ordered ?  ?Post Interventions ?Patient Tolerated: Well ?Instructions Provided: Care of device ? ?Janit Pagan ?10/30/2021, 3:39 PM ? ?

## 2021-10-31 DIAGNOSIS — R55 Syncope and collapse: Secondary | ICD-10-CM | POA: Diagnosis present

## 2021-10-31 LAB — BASIC METABOLIC PANEL
Anion gap: 11 (ref 5–15)
BUN: 25 mg/dL — ABNORMAL HIGH (ref 6–20)
CO2: 28 mmol/L (ref 22–32)
Calcium: 8.9 mg/dL (ref 8.9–10.3)
Chloride: 99 mmol/L (ref 98–111)
Creatinine, Ser: 3.89 mg/dL — ABNORMAL HIGH (ref 0.61–1.24)
GFR, Estimated: 17 mL/min — ABNORMAL LOW (ref >60)
Glucose, Bld: 129 mg/dL — ABNORMAL HIGH (ref 70–99)
Potassium: 3.1 mmol/L — ABNORMAL LOW (ref 3.5–5.1)
Sodium: 138 mmol/L (ref 135–145)

## 2021-10-31 LAB — LIPID PANEL
Cholesterol: 143 mg/dL (ref 0–200)
HDL: 33 mg/dL — ABNORMAL LOW (ref >40)
LDL Cholesterol: 82 mg/dL (ref 0–99)
Total CHOL/HDL Ratio: 4.3 RATIO
Triglycerides: 139 mg/dL (ref <150)
VLDL: 28 mg/dL (ref 0–40)

## 2021-10-31 LAB — CBC
HCT: 32.8 % — ABNORMAL LOW (ref 39.0–52.0)
Hemoglobin: 10.9 g/dL — ABNORMAL LOW (ref 13.0–17.0)
MCH: 29.9 pg (ref 26.0–34.0)
MCHC: 33.2 g/dL (ref 30.0–36.0)
MCV: 90.1 fL (ref 80.0–100.0)
Platelets: 138 10*3/uL — ABNORMAL LOW (ref 150–400)
RBC: 3.64 MIL/uL — ABNORMAL LOW (ref 4.22–5.81)
RDW: 13.1 % (ref 11.5–15.5)
WBC: 6.8 10*3/uL (ref 4.0–10.5)
nRBC: 0 % (ref 0.0–0.2)

## 2021-10-31 LAB — GLUCOSE, CAPILLARY
Glucose-Capillary: 84 mg/dL (ref 70–99)
Glucose-Capillary: 109 mg/dL — ABNORMAL HIGH (ref 70–99)
Glucose-Capillary: 102 mg/dL — ABNORMAL HIGH (ref 70–99)
Glucose-Capillary: 111 mg/dL — ABNORMAL HIGH (ref 70–99)

## 2021-10-31 LAB — HEPATITIS B SURFACE ANTIBODY,QUALITATIVE: Hep B S Ab: REACTIVE — AB

## 2021-10-31 LAB — PROTIME-INR
INR: 1.8 — ABNORMAL HIGH (ref 0.8–1.2)
Prothrombin Time: 20.8 seconds — ABNORMAL HIGH (ref 11.4–15.2)

## 2021-10-31 LAB — LACTATE DEHYDROGENASE: LDH: 173 U/L (ref 98–192)

## 2021-10-31 LAB — HIV ANTIBODY (ROUTINE TESTING W REFLEX): HIV Screen 4th Generation wRfx: NONREACTIVE

## 2021-10-31 LAB — MRSA NEXT GEN BY PCR, NASAL: MRSA by PCR Next Gen: NOT DETECTED

## 2021-10-31 LAB — HEPATITIS B SURFACE ANTIGEN: Hepatitis B Surface Ag: NONREACTIVE

## 2021-10-31 MED ORDER — ALTEPLASE 2 MG IJ SOLR
2.0000 mg | Freq: Once | INTRAMUSCULAR | Status: DC | PRN
  Filled 2021-10-31: qty 2

## 2021-10-31 MED ORDER — MIDODRINE HCL 5 MG PO TABS: 5.0000 mg | ORAL_TABLET | Freq: Three times a day (TID) | ORAL | Status: DC

## 2021-10-31 MED ORDER — SODIUM CHLORIDE 0.9 % IV SOLN: 250.0000 mL | INTRAVENOUS | Status: DC | PRN

## 2021-10-31 MED ORDER — TRAMADOL HCL 50 MG PO TABS
50.0000 mg | ORAL_TABLET | Freq: Two times a day (BID) | ORAL | Status: DC | PRN
  Administered 2021-10-31 – 2021-11-02 (×4): 50 mg via ORAL
  Filled 2021-10-31 (×4): qty 1

## 2021-10-31 MED ORDER — MIDODRINE HCL 5 MG PO TABS
10.0000 mg | ORAL_TABLET | ORAL | Status: DC
  Administered 2021-11-01 – 2021-11-08 (×10): 10 mg via ORAL
  Filled 2021-10-31 (×9): qty 2

## 2021-10-31 MED ORDER — WARFARIN SODIUM 5 MG PO TABS: 5.0000 mg | ORAL_TABLET | Freq: Once | ORAL | Status: DC

## 2021-10-31 MED ORDER — VANCOMYCIN HCL 750 MG/150ML IV SOLN
750.0000 mg | Freq: Once | INTRAVENOUS | Status: AC
  Administered 2021-10-31: 750 mg via INTRAVENOUS
  Filled 2021-10-31: qty 150

## 2021-10-31 MED ORDER — MIDODRINE HCL 5 MG PO TABS: 5.0000 mg | ORAL_TABLET | ORAL | Status: DC

## 2021-10-31 MED ORDER — ACETAMINOPHEN 325 MG PO TABS: 650.0000 mg | ORAL_TABLET | ORAL | Status: DC | PRN

## 2021-10-31 MED ORDER — SODIUM CHLORIDE 0.9 % IV SOLN: 100.0000 mL | INTRAVENOUS | Status: DC | PRN

## 2021-10-31 MED ORDER — ROSUVASTATIN CALCIUM 5 MG PO TABS
10.0000 mg | ORAL_TABLET | Freq: Every day | ORAL | Status: DC
Start: 2021-10-31 — End: 2021-10-31

## 2021-10-31 MED ORDER — HEPARIN SODIUM (PORCINE) 1000 UNIT/ML DIALYSIS
1000.0000 [IU] | INTRAMUSCULAR | Status: DC | PRN
  Administered 2021-11-01: 1000 [IU] via INTRAVENOUS_CENTRAL
  Filled 2021-10-31 (×3): qty 1

## 2021-10-31 MED ORDER — SODIUM CHLORIDE 0.9% FLUSH
3.0000 mL | Freq: Two times a day (BID) | INTRAVENOUS | Status: DC
  Administered 2021-10-31 – 2021-11-07 (×16): 3 mL via INTRAVENOUS

## 2021-10-31 MED ORDER — MIDODRINE HCL 5 MG PO TABS
10.0000 mg | ORAL_TABLET | Freq: Three times a day (TID) | ORAL | Status: DC
  Administered 2021-10-31 (×2): 10 mg via ORAL
  Filled 2021-10-31 (×2): qty 2

## 2021-10-31 MED ORDER — PANTOPRAZOLE SODIUM 40 MG PO TBEC
40.0000 mg | DELAYED_RELEASE_TABLET | Freq: Every day | ORAL | Status: DC
  Administered 2021-10-31 – 2021-11-08 (×9): 40 mg via ORAL
  Filled 2021-10-31 (×8): qty 1

## 2021-10-31 MED ORDER — ONDANSETRON HCL 4 MG/2ML IJ SOLN: 4.0000 mg | Freq: Four times a day (QID) | INTRAMUSCULAR | Status: DC | PRN

## 2021-10-31 MED ORDER — POTASSIUM CHLORIDE CRYS ER 20 MEQ PO TBCR
30.0000 meq | EXTENDED_RELEASE_TABLET | Freq: Once | ORAL | Status: AC
  Administered 2021-10-31: 30 meq via ORAL
  Filled 2021-10-31: qty 1

## 2021-10-31 MED ORDER — WARFARIN SODIUM 3 MG PO TABS
6.0000 mg | ORAL_TABLET | Freq: Once | ORAL | Status: AC
  Administered 2021-10-31: 6 mg via ORAL
  Filled 2021-10-31: qty 2

## 2021-10-31 MED ORDER — PENTAFLUOROPROP-TETRAFLUOROETH EX AERO
1.0000 "application " | INHALATION_SPRAY | CUTANEOUS | Status: DC | PRN
  Filled 2021-10-31: qty 116

## 2021-10-31 MED ORDER — ATORVASTATIN CALCIUM 40 MG PO TABS
40.0000 mg | ORAL_TABLET | Freq: Every day | ORAL | Status: DC
  Administered 2021-10-31 – 2021-11-08 (×9): 40 mg via ORAL
  Filled 2021-10-31 (×9): qty 1

## 2021-10-31 MED ORDER — SODIUM CHLORIDE 0.9% FLUSH: 3.0000 mL | INTRAVENOUS | Status: DC | PRN

## 2021-10-31 MED ORDER — LIDOCAINE-PRILOCAINE 2.5-2.5 % EX CREA
1.0000 "application " | TOPICAL_CREAM | CUTANEOUS | Status: DC | PRN
  Filled 2021-10-31: qty 5

## 2021-10-31 MED ORDER — LIDOCAINE HCL (PF) 1 % IJ SOLN: 5.0000 mL | INTRAMUSCULAR | Status: DC | PRN

## 2021-10-31 NOTE — Care Management Important Message (Signed)
Important Message ? ?Patient Details  ?Name: Marcus Johnson. ?MRN: [id number] ?Date of Birth: 09-Nov-1960 ? ? ?Medicare Important Message Given:  Yes ? ? ? ? ?[name] ?10/31/2021, 1:41 PM ?

## 2021-10-31 NOTE — Progress Notes (Signed)
Heart Failure Navigator Progress Note ? ?Assessed for Heart & Vascular TOC clinic readiness.  ?Patient does not meet criteria due to patient is a LVAD and part of the AHF Team. .  ? ? ? ?Earnestine Leys, BSN, RN ?Heart Failure Nurse Navigator ?[phone]   ?

## 2021-10-31 NOTE — TOC Initial Note (Signed)
Transition of Care (TOC) - Initial/Assessment Note  ? ? ?Patient Details  ?Name: Marcus Johnson. ?MRN: [id number] ?Date of Birth: 07/31/1961 ? ?Transition of Care [facility]) CM/SW Contact:    ?Marcus Grammes Rexene Alberts, RN ?Phone Number: [phone] ?10/31/2021, 5:12 PM ? ?Clinical Narrative:                 ?HF TOC CM spoke to pt and states he lives at home with son and brother. States he has a flight of 24 stairs to get to his apt. States he has RW, wheelchair and shower bench in bathroom. Pt states he would like to go to IP rehab prior to dc home. Will continue to follow for dc needs.  ? ?Expected Discharge Plan: Home/Self Care ?Barriers to Discharge: Continued Medical Work up ? ? ?Patient Goals and CMS Choice ?Patient states their goals for this hospitalization and ongoing recovery are:: wants to be able to ambulate and get back to baseline ?CMS Medicare.gov Compare Post Acute Care list provided to:: Patient ?  ? ?Expected Discharge Plan and Services ?Expected Discharge Plan: Home/Self Care ?  ?Discharge Planning Services: CM Consult ?  ?Living arrangements for the past 2 months: Apartment ?                ?  ?  ?  ?  ?  ?  ?  ?  ?  ?  ? ?Prior Living Arrangements/Services ?Living arrangements for the past 2 months: Apartment ?Lives with:: Adult Children, Siblings (brother, son) ?  ?Do you feel safe going back to the place where you live?: Yes      ?Need for Family Participation in Patient Care: Yes (Comment) ?Care giver support system in place?: Yes (comment) ?  ?Criminal Activity/Legal Involvement Pertinent to Current Situation/Hospitalization: No - Comment as needed ? ?Activities of Daily Living ?Home Assistive Devices/Equipment: Cane (specify quad or straight) ?ADL Screening (condition at time of admission) ?Patient's cognitive ability adequate to safely complete daily activities?: Yes ?Is the patient deaf or have difficulty hearing?: No ?Does the patient have difficulty seeing, even when wearing  glasses/contacts?: No ?Does the patient have difficulty concentrating, remembering, or making decisions?: No ?Patient able to express need for assistance with ADLs?: Yes ?Does the patient have difficulty dressing or bathing?: No ?Independently performs ADLs?: Yes (appropriate for developmental age) ?Does the patient have difficulty walking or climbing stairs?: Yes ?Weakness of Legs: Left ?Weakness of Arms/Hands: None ? ?Permission Sought/Granted ?Permission sought to share information with : Case Manager, Family Supports, PCP ?Permission granted to share information with : Yes, Verbal Permission Granted ? Share Information with NAME: Marcus Johnson ?   ? Permission granted to share info w Relationship: son ? Permission granted to share info w Contact Information: [phone] ? ?Emotional Assessment ?Appearance:: Appears stated age ?Attitude/Demeanor/Rapport: Engaged ?Affect (typically observed): Accepting ?Orientation: : Oriented to Self, Oriented to Place, Oriented to  Time, Oriented to Situation ?  ?Psych Involvement: No (comment) ? ?Admission diagnosis:  Syncope and collapse [R55] ?Fall, initial encounter [W19.XXXA] ?Closed nondisplaced fracture of fifth metatarsal bone of left foot, initial encounter [S92.355A] ?Chronic kidney disease, unspecified CKD stage [N18.9] ?Syncope [R55] ?Patient Active Problem List  ? Diagnosis Date Noted  ? Syncope 10/31/2021  ? Syncope and collapse 10/30/2021  ? Gastroparesis 12/09/2020  ? Orthostatic hypotension 12/09/2020  ? Dependence on hemodialysis ([place]) 12/09/2020  ? Anemia of chronic illness 12/09/2020  ? A-fib ([place]) 12/09/2020  ? Debility 11/14/2020  ? Pressure injury of  skin 10/16/2020  ? LVAD (left ventricular assist device) present ([place]) 10/13/2020  ? Dental caries   ? Loss of teeth due to extraction   ? Acute on chronic systolic heart failure ([place]) 09/28/2020  ? Osteomyelitis of metatarsal ([place]) 08/11/2018  ? Systolic CHF ([place]) 09/81/1914  ? Acute on chronic systolic  (congestive) heart failure ([place]) 01/07/2018  ? CKD (chronic kidney disease), stage III ([place]) 12/20/2017  ? Chronic combined systolic and diastolic congestive heart failure ([place]) 04/16/2016  ? Generalized weakness 04/15/2016  ? Acute renal failure superimposed on stage 3 chronic kidney disease ([place]) 04/15/2016  ? Dehydration   ? Non compliance with medical treatment   ? Obesity (BMI 30-39.9) 07/21/2014  ? Cardiomyopathy, ischemic 07/21/2014  ? Coronary artery disease involving native coronary artery of native heart without angina pectoris   ? Lower extremity edema 07/19/2014  ? DM (diabetes mellitus) type II controlled with renal manifestation ([place]) 07/19/2014  ? CAP (community acquired pneumonia) 10/21/2013  ? Dyspnea 10/19/2013  ? Automatic implantable cardioverter-defibrillator  Medtronic 01/08/2012  ? PLANTAR FASCIITIS 10/08/2008  ? CARBUNCLE AND FURUNCLE OF TRUNK 08/09/2008  ? HLD (hyperlipidemia) 01/09/2008  ? HTN (hypertension) 01/09/2008  ? MYOCARDIAL INFARCTION, HX OF 01/09/2008  ? Ischemic cardiomyopathy 01/09/2008  ? ?PCP:  Marcus Morale, MD ?Pharmacy:   ?[facility], [place] - [address] ?[address] ?[place] [address] ?Phone: [phone] Fax: [phone] ? ? ? ? ?Social Determinants of Health (SDOH) Interventions ?  ? ?Readmission Risk Interventions ? ?  11/14/2020  ? 11:28 AM  ?Readmission Risk Prevention Plan  ?Transportation Screening Complete  ?Medication Review Press photographer) Complete  ?PCP or Specialist appointment within 3-5 days of discharge Complete  ?[place] or Home Care Consult Complete  ?SW Recovery Care/Counseling Consult Complete  ?Palliative Care Screening Not Applicable  ?[place] Not Applicable  ? ? ? ?

## 2021-10-31 NOTE — Progress Notes (Signed)
?   10/30/21 2329  ?Assess: MEWS Score  ?BP (!) 69/55 ?(LVAD)  ?Pulse Rate 84  ?ECG Heart Rate 89  ?Resp 18  ?Level of Consciousness Alert  ?SpO2 97 %  ?O2 Device Room Air  ?Assess: MEWS Score  ?MEWS Temp 0  ?MEWS Systolic 3  ?MEWS Pulse 0  ?MEWS RR 0  ?MEWS LOC 0  ?MEWS Score 3  ?MEWS Score Color Yellow  ?Assess: if the MEWS score is Yellow or Red  ?Were vital signs taken at a resting state? Yes  ?Focused Assessment No change from prior assessment  ?Early Detection of Sepsis Score *See Row Information* Medium  ?MEWS guidelines implemented *See Row Information* Yes  ?Treat  ?MEWS Interventions Escalated (See documentation below)  ?Pain Scale 0-10  ?Pain Score 0  ?Take Vital Signs  ?Increase Vital Sign Frequency  Yellow: Q 2hr X 2 then Q 4hr X 2, if remains yellow, continue Q 4hrs  ?Escalate  ?MEWS: Escalate Yellow: discuss with charge nurse/RN and consider discussing with provider and RRT  ?Notify: Charge Nurse/RN  ?Name of Charge Nurse/RN Notified Tanya, RN  ?Date Charge Nurse/RN Notified 10/30/21  ?Time Charge Nurse/RN Notified 2329  ?Document  ?Patient Outcome Other (Comment) ?(stable and on unit- LVAd patient)  ?Progress note created (see row info) Yes  ? ? ?

## 2021-10-31 NOTE — Evaluation (Signed)
Occupational Therapy Evaluation ?Patient Details ?Name: Marcus Johnson. ?MRN: [id number] ?DOB: 01-08-1961 ?Today's Date: 10/31/2021 ? ? ?History of Present Illness Pt is a 61 y.o. M who presents 10/30/2021 with multiple falls; thought to be possible seizure activity vs syncopal episodes. Found to have left MT fxs with left 5th and possible 4th MT base fxs. CT head negative for acute abnoramlity. Significant PMH: chronic systolic CHF/ICM s/p Medtronic single chamber ICD, HM III LVAD, CAD s/p CABG, ESRD on HD.  ? ?Clinical Impression ?  ?Pt typically ambulates without DME (but has it for PRN needs - is more fatigued on HD days), Brother and Son complete IADL, and brother assists with LB ADL. Today he was max A for LB dressing (baseline), min A for bed mobility, min A +2 safety for transfers, set up for grooming activities in seated position. Pt today is limited by orthostatic pressure (see below) and episode of decreased responsiveness. At this time recommending AIR level therapy post-acute to maximize safety and independence in ADL and functional transfers as Pt has 3 flights of stairs to access his apt. Pt has excellent support from his brother and son (24/7) and is a HIGHLY motivated and very pleasant individual. OT will continue to follow acutely with focus on activity tolerance, energy conservation education, and safety/fall risk prevention during ADL.  ? ?100% on RA ?78/67 (72) supine, 95 HR ?111/59 (75) sitting EOB ?57/49 (54) sitting EOB after attempted stand (unable to complete BP in standing) - and went into "decreased responsiveness episode"  ?87/73 (79) sitting EOB ?85/67 (74) after step pivot to recliner ? ?OF NOTE: SpO2 WFL on RA, HR WFL, Pt had a 60 second window sitting EOB immediately after standing where he had decreased-responsiveness: not responding to questions, staring off into space, exhibiting rhymic movement of mouth and tongue and odd breathing patterns. When he did start responding to  questions he did not remember this 60 second time frame or the incident. But was alert, answering questions, and coherent immediately afterwards. ?   ? ?Recommendations for follow up therapy are one component of a multi-disciplinary discharge planning process, led by the attending physician.  Recommendations may be updated based on patient status, additional functional criteria and insurance authorization.  ? ?Follow Up Recommendations ? Acute inpatient rehab (3hours/day)  ?  ?Assistance Recommended at Discharge Intermittent Supervision/Assistance (whenever he is up walking/standing)  ?Patient can return home with the following A little help with walking and/or transfers;A lot of help with bathing/dressing/bathroom;Assistance with cooking/housework;Assist for transportation;Help with stairs or ramp for entrance ? ?  ?Functional Status Assessment ? Patient has had a recent decline in their functional status and demonstrates the ability to make significant improvements in function in a reasonable and predictable amount of time.  ?Equipment Recommendations ? None recommended by OT (Pt has appropriate DME)  ?  ?Recommendations for Other Services Rehab consult;PT consult ? ? ?  ?Precautions / Restrictions Precautions ?Precautions: Fall (Orthostatic, LVAD) ?Required Braces or Orthoses: Other Brace ?Other Brace: L CAM boot ?Restrictions ?Weight Bearing Restrictions: Yes ?LLE Weight Bearing: Weight bearing as tolerated  ? ?  ? ?Mobility Bed Mobility ?Overal bed mobility: Needs Assistance ?Bed Mobility: Supine to Sit ?  ?  ?Supine to sit: Min assist, HOB elevated ?  ?  ?General bed mobility comments: Pt reaching out for therapist for trunk elevation ?  ? ?Transfers ?Overall transfer level: Needs assistance ?Equipment used: Rolling walker (2 wheels) ?Transfers: Sit to/from Stand, Bed to chair/wheelchair/BSC ?Sit to  Stand: Min assist, +2 safety/equipment ?  ?  ?Step pivot transfers: Min assist, +2 safety/equipment ?  ?   ?General transfer comment: Pt pulling up on RW for transfer, pain in L foot with WB, unable to maintain standing ?  ? ?  ?Balance Overall balance assessment: Needs assistance ?Sitting-balance support: No upper extremity supported, Feet supported ?Sitting balance-Leahy Scale: Good ?  ?  ?Standing balance support: Bilateral upper extremity supported, Reliant on assistive device for balance ?Standing balance-Leahy Scale: Poor ?  ?  ?  ?  ?  ?  ?  ?  ?  ?  ?  ?  ?   ? ?ADL either performed or assessed with clinical judgement  ? ?ADL Overall ADL's : Needs assistance/impaired ?Eating/Feeding: Set up;Sitting ?  ?Grooming: Min guard;Sitting ?Grooming Details (indicate cue type and reason): cannot perform in standing today ?Upper Body Bathing: Moderate assistance ?Upper Body Bathing Details (indicate cue type and reason): for back ?Lower Body Bathing: Moderate assistance;Sitting/lateral leans ?Lower Body Bathing Details (indicate cue type and reason): knees down ?Upper Body Dressing : Minimal assistance;Sitting ?Upper Body Dressing Details (indicate cue type and reason): to don vest around lines ?Lower Body Dressing: Maximal assistance ?Lower Body Dressing Details (indicate cue type and reason): to don socks and R shoe - brother does this at baseline ?Toilet Transfer: Minimal assistance;+2 for safety/equipment;Stand-pivot;Rolling walker (2 wheels) ?Toilet Transfer Details (indicate cue type and reason): see transfer section for details ?  ?  ?Tub/ Shower Transfer:  (NA) ?  ?Functional mobility during ADLs: Minimal assistance;+2 for safety/equipment;Rolling walker (2 wheels) ?General ADL Comments: decreased ability to perform ADL in standing, frequent falls, neuropathy in R (dominant) hand,  ? ? ? ?Vision   ?   ?   ?Perception   ?  ?Praxis   ?  ? ?Pertinent Vitals/Pain Pain Assessment ?Pain Assessment: 0-10 ?Pain Score: 4  ?Pain Location: L foot in WB ?Pain Descriptors / Indicators: Discomfort, Grimacing ?Pain  Intervention(s): Limited activity within patient's tolerance, Monitored during session, Repositioned, Premedicated before session  ? ? ? ?Hand Dominance Right (but uses Left to feed himself) ?  ?Extremity/Trunk Assessment Upper Extremity Assessment ?Upper Extremity Assessment: Generalized weakness (baseline neuropathy in R hand) ?  ?Lower Extremity Assessment ?Lower Extremity Assessment: LLE deficits/detail ?LLE Deficits / Details: in CAM boot ?  ?  ?  ?Communication Communication ?Communication: No difficulties ?  ?Cognition   ?  ?  ?  ?  ?  ?  ?  ?  ?  ?  ?  ?  ?  ?  ?  ?  ?  ?  ?  ?  ?  ?General Comments  SpO2 WFL on RA, HR WFL, Pt had a 60 second window sitting EOB immediately after standing where he was decreased-responsiveness (not responding to questions) where he was exhibiting rhymic movement of mouth and tongue and odd breathing patterns. When he did start responding to questions he did not remember this time frame or the incident. ? ?  ?Exercises   ?  ?Shoulder Instructions    ? ? ?Home Living Family/patient expects to be discharged to:: Private residence ?Living Arrangements: Other relatives;Children (brother, son) ?Available Help at Discharge: Family;Available 24 hours/day ?Type of Home: Apartment ?Home Access: Stairs to enter ?Entrance Stairs-Number of Steps: 3 flights ?Entrance Stairs-Rails: Right;Left ?Home Layout: One level ?  ?  ?Bathroom Shower/Tub: Tub/shower unit;Sponge bathes at baseline ?  ?Bathroom Toilet: Standard ?  ?  ?Home Equipment: Conservation officer, nature (2  wheels);Rollator (4 wheels);Wheelchair - manual;BSC/3in1;Shower seat;Grab bars - toilet ?  ?Additional Comments: Brother drives to HD ?  ? ?  ?Prior Functioning/Environment Prior Level of Function : Needs assist;History of Falls (last six months) ?  ?  ?  ?  ?  ?  ?Mobility Comments: has RW, uses PRN ?ADLs Comments: Sponge bathes at sink, brother assists with LB drressing, brother/son complete IADL, brother drives to HD ?  ? ?  ?  ?OT  Problem List: Decreased activity tolerance;Impaired balance (sitting and/or standing);Decreased knowledge of use of DME or AE;Cardiopulmonary status limiting activity;Impaired sensation;Impaired UE functional use;Pain ?  ?   ?OT

## 2021-10-31 NOTE — TOC CAGE-AID Note (Signed)
Transition of Care (TOC) - CAGE-AID Screening ? ? ?Patient Details  ?Name: Chaney Maclaren. ?MRN: [id number] ?Date of Birth: October 19, 1960 ? ?Transition of Care (TOC) CM/SW Contact:    ?Vicki Chaffin C Tarpley-Carter, LCSWA ?Phone Number: ?10/31/2021, 2:30 PM ? ? ?Clinical Narrative: ?Pt participated in [place].  Pt stated he does not use substance or ETOH.  Pt was not offered resources, due to no usage of substance or ETOH.    ? ?Passenger transport manager, MSW, LCSW-A ?Pronouns:  She/Her/Hers ?[facility] HealthTransitions of Care ?Clinical Social Worker ?Direct Number:  [phone] ?[email or web address] ? ?CAGE-AID Screening: ?  ? ?Have You Ever Felt You Ought to Cut Down on Your Drinking or Drug Use?: No ?Have People Annoyed You By Critizing Your Drinking Or Drug Use?: No ?Have You Felt Bad Or Guilty About Your Drinking Or Drug Use?: No ?Have You Ever Had a Drink or Used Drugs First Thing In The Morning to Steady Your Nerves or to Get Rid of a Hangover?: No ?CAGE-AID Score: 0 ? ?Substance Abuse Education Offered: No ? ?  ? ? ? ? ? ? ?

## 2021-10-31 NOTE — Progress Notes (Signed)
Admit: 10/30/2021 ?LOS: 1 ? ?57M ESRD on HD [facility] MWF, LVAD; s/p fall at home and s/p L metatarsal fracture ? ?Subjective:  ?HD yesterday, No UF ?Midodrine increased to daily this AM by AHF ?Post weight was near/just above EDW ?OOB to chair this AM ?AM labs K 3.1, ordered KCl 16mq ? ?03/27 0701 - 03/28 0700 ?In: 240 [P.O.:240] ?Out: 300 [Urine:300] ? ?Filed Weights  ? 10/30/21 2015 10/30/21 2113 10/31/21 0325  ?Weight: 71.6 kg 71 kg 70.7 kg  ? ? ?Scheduled Meds: ? atorvastatin  40 mg Oral Daily  ? Chlorhexidine Gluconate Cloth  6 each Topical Q0600  ? gabapentin  100 mg Oral Daily  ? gabapentin  200 mg Oral QHS  ? insulin aspart  0-5 Units Subcutaneous QHS  ? insulin aspart  0-9 Units Subcutaneous TID WC  ? midodrine  10 mg Oral TID WC  ? pantoprazole  40 mg Oral Daily  ? sodium chloride flush  3 mL Intravenous Q12H  ? warfarin  5 mg Oral ONCE-1600  ? Warfarin - Pharmacist Dosing Inpatient   Does not apply [id number] ? ?Continuous Infusions: ? sodium chloride    ? sodium chloride    ? sodium chloride    ? vancomycin    ? ?PRN Meds:.sodium chloride, sodium chloride, sodium chloride, acetaminophen, alteplase, heparin, lidocaine (PF), lidocaine-prilocaine, ondansetron (ZOFRAN) IV, pentafluoroprop-tetrafluoroeth, sodium chloride flush, traMADol ? ?Current Labs: reviewed  ? ? ?Physical Exam:  Blood pressure (!) 85/74, pulse (!) 194, temperature 98.7 ?F (37.1 ?C), temperature source Oral, resp. rate (!) 22, height '5\' 10"'$  (1.778 m), weight 70.7 kg, SpO2 98 %. ?[place] ?Pleasant positive affect ?Cont hum on chest exam ?Nl WOB ?R IJ TDC c/d/I ?No sig LEE ?Nonfocal CN2-12 intact ? ?[facility] ?4 hrs 180NRe 400/Autoflow 1.5 71 kg 2.0K/2.0 Ca TDC ?-No heparin ?-Mircera 30 mcg IV q 2 weeks (last dose 10/25/2021) ?-Venofer 50 mg IV weekly ?-No VDRA ? ?A ?ESRD MWF [facility] ?Syncope post HD / malaise post HD; likely needs EDW increased ?HFrEFS/p LVAD, on vanc for ? Driveline infection ?S/p L metatarsal  fractures, ortho following, PT/OT ?Anemia, hb stable, not due for ESA ?CMD-BMD: not on binders, Ca ok, CTM ?DM2 ? ?P ?HD tomorrow, no UF, allow post weights to drift up: 3K, TDC, 400/800, no heparin ?PT/OT ?Medication Issues; ?Preferred narcotic agents for pain control are hydromorphone, fentanyl, and methadone. Morphine should not be used.  ?Baclofen should be avoided ?Avoid oral sodium phosphate and magnesium citrate based laxatives / bowel preps  ? ? ?RPearson GrippeMD ?10/31/2021, 9:38 AM ? ?Recent Labs  ?Lab 10/30/21 ?1046 10/30/21 ?1054 10/31/21 ?0118  ?NA 140 137 138  ?K 4.0 3.9 3.1*  ?CL 102 103 99  ?CO2 24  --  28  ?GLUCOSE 110* 113* 129*  ?BUN 68* 65* 25*  ?CREATININE 7.70* 8.10* 3.89*  ?CALCIUM 9.7  --  8.9  ? ?Recent Labs  ?Lab 10/30/21 ?1046 10/30/21 ?1054 10/31/21 ?0118  ?WBC 8.1  --  6.8  ?HGB 10.8* 11.6* 10.9*  ?HCT 33.8* 34.0* 32.8*  ?MCV 92.3  --  90.1  ?PLT 133*  --  138*  ? ? ? ? ? ? ? ? ? ?  ?

## 2021-10-31 NOTE — Progress Notes (Addendum)
ANTICOAGULATION CONSULT NOTE - Initial Consult ? ?Pharmacy Consult for warfarin ?Indication:  LVAD HM3 ? ?Allergies  ?Allergen Reactions  ? Meclizine Hcl Anaphylaxis and Swelling  ? Ivabradine Nausea Only  ? ? ?Patient Measurements: ?Height: '5\' 10"'$  (177.8 cm) ?Weight: 70.7 kg (155 lb 13.8 oz) (patient too weak to stand) ?IBW/kg (Calculated) : 73 ?Heparin Dosing Weight: 71.7 kg ? ?Vital Signs: ?Temp: 98.8 ?F (37.1 ?C) (03/28 0325) ?Temp Source: Oral (03/28 0325) ?BP: 65/57 (03/28 0325) ?Pulse Rate: 85 (03/28 0325) ? ?Labs: ?Recent Labs  ?  10/30/21 ?1046 10/30/21 ?1054 10/30/21 ?1059 10/30/21 ?1344 10/31/21 ?0118  ?HGB 10.8* 11.6*  --   --  10.9*  ?HCT 33.8* 34.0*  --   --  32.8*  ?PLT 133*  --   --   --  138*  ?LABPROT 20.8*  --   --  21.1* 20.8*  ?INR 1.8*  --   --  1.8* 1.8*  ?CREATININE 7.70* 8.10*  --   --  3.89*  ?TROPONINIHS  --   --  463* 1,274*  --   ? ? ? ?Estimated Creatinine Clearance: 20.2 mL/min (A) (by C-G formula based on SCr of 3.89 mg/dL (H)). ? ? ?Medical History: ?Past Medical History:  ?Diagnosis Date  ? AICD (automatic cardioverter/defibrillator) present   ? Single-chamber  implantable cardiac defibrillator - [employer]  ? Atrial fibrillation ([place])   ? Cataract   ? Mixed form OU  ? CHF (congestive heart failure) ([place])   ? Chronic kidney disease   ? Chronic kidney disease (CKD), stage III (moderate) (HCC)   ? Chronic systolic heart failure ([employer])   ? a. Echo 5/13: Mild LVE, mild LVH, EF 10%, anteroseptal, lateral, apical AK, mild MR, mild LAE, moderately reduced RVSF, mild RAE, PASP 60;  b. 07/2014 Echo: EF 20-2%, diff HK, AKI of antsep/apical/mid-apicalinferior, mod reduced RV.  ? Coronary artery disease   ? a. s/p CABG 2002 b. LHC 5/13:  dLM 80%, LAD subtotally occluded, pCFX occluded, pRCA 50%, mid? Occlusion with high take off of the PDA with 70% multiple lesions-not bypassed and supplies collaterals to LAD, LIMA-IM/ramus ok, S-OM ok, S-PLA branches ok. Medical therapy was recommended  ? COVID    ? Diabetic retinopathy ([place])   ? NPDR OD, PDR OS  ? Dyspnea   ? Gout   ? "on daily RX" (01/08/2018)  ? Hypertension   ? Hypertensive retinopathy   ? OU  ? Ischemic cardiomyopathy   ? a. Echo 5/13: Mild LVE, mild LVH, EF 10%, anteroseptal, lateral, apical AK, mild MR, mild LAE, moderately reduced RVSF, mild RAE, PASP 60;  b. 01/2012 s/p MDT [id number] Protecta XT VR AICD;  c. 07/2014 Echo: EF 20-2%, diff HK, AKI of antsep/apical/mid-apicalinferior, mod reduced RV.  ? MRSA (methicillin resistant Staphylococcus aureus)   ? Status post right foot plantar deep infection with MRSA status post  I&D 10/2008  ? Myocardial infarction [facility])   ? "was told I'd had several before heart OR in 2002" (01/08/2018)  ? Noncompliance   ? Peripheral neuropathy   ? Retinopathy, diabetic, background ([place])   ? Syncope   ? Type II diabetes mellitus ([place])   ? requiring insulin   ? Vitreous hemorrhage, left (HCC)   ? and proliferative diabetic retinopathy  ? ? ?Medications:  ?Scheduled:  ? Chlorhexidine Gluconate Cloth  6 each Topical Q0600  ? gabapentin  100 mg Oral Daily  ? gabapentin  200 mg Oral QHS  ? insulin aspart  0-5 Units  Subcutaneous QHS  ? insulin aspart  0-9 Units Subcutaneous TID WC  ? midodrine  10 mg Oral 3 times per day on Mon Wed Fri  ? pantoprazole  40 mg Oral Daily  ? rosuvastatin  10 mg Oral Daily  ? sodium chloride flush  3 mL Intravenous Q12H  ? warfarin  5 mg Oral ONCE-1600  ? Warfarin - Pharmacist Dosing Inpatient   Does not apply [id number]  ? ? ?Assessment: ?70 yom presenting after fall, head CT negative. Was found to have mild displaced fractures at 5h metatarsal. On warfarin PTA for hx LVAD HM3 - LD 3/26'@1900'$ . ? ?INR this morning remains subtherapeutic at 1.8. Warfarin dose of 5 mg ordered for 3/27, but not given. Hgb dropped from 11.6 to 10.9, but plt stable in the 130s. LDH stable in the 170s. No s/sx of bleeding. Will give higher dose tonight given missed dose yesterday. ? ?PTA regimen 4.5 mg daily except 6 mg on  Tuesday. ? ?Goal of Therapy:  ?INR 2-2.5 ?Monitor platelets by anticoagulation protocol: Yes ?  ?Plan:  ?Warfarin 6 mg tonight ?Monitor daily INR, CBC, and for s/sx of bleeding  ? ?Cathrine Muster, PharmD ?PGY2 Cardiology Pharmacy Resident ?Phone: [phone] ?10/31/2021  6:20 AM ? ?Please check [email or web address] for unit-specific pharmacy phone numbers. ? ?Please check AMION for all [place] phone numbers ?After 10:00 PM, call [facility] [phone] ? ? ? ?

## 2021-10-31 NOTE — Progress Notes (Addendum)
?Advanced Heart Failure VAD Team Note ? ?PCP-Cardiologist: None  ? ?Subjective:   ? ?Started on empiric vancomycin for suspected DL infection. Cultures pending. AF. WBC nl.  ? ?Had HD yesterday. Felt dizzy afterwards ? ?MAPs upper 60s-70s overnight  ?  ?Hgb ok 10.9  ? ?Appetite is ok. Continues w/ left foot pain. In boot, still unable to bear weight. Unable to check orthostatic VS given inability to participate.   ? ?LVAD INTERROGATION:  ?HeartMate III LVAD:   ?Flow 5.4 liters/min, speed 5800, power 4.6, PI 3.2.  Numerous PI events  ? ?Objective:   ? ?Vital Signs:   ?Temp:  [97.9 ?F (36.6 ?C)-98.8 ?F (37.1 ?C)] 98.8 ?F (37.1 ?C) (03/28 0325) ?Pulse Rate:  [78-92] 85 (03/28 0325) ?Resp:  [13-22] 19 (03/28 0325) ?BP: (65-118)/(48-103) 65/57 (03/28 0325) ?SpO2:  [95 %-100 %] 95 % (03/28 0325) ?Weight:  [70.7 kg-71.7 kg] 70.7 kg (03/28 0325) ?Last BM Date : 10/30/21 ?Mean arterial Pressure 60s-70s  ? ?Intake/Output:  ? ?Intake/Output Summary (Last 24 hours) at 10/31/2021 0719 ?Last data filed at 10/31/2021 8527 ?Gross per 24 hour  ?Intake 240 ml  ?Output 300 ml  ?Net -60 ml  ?  ? ?Physical Exam  ?  ?General:  Well appearing. No resp difficulty ?HEENT: normal ?Neck: supple. JVP not elevated . Carotids 2+ bilat; no bruits. No lymphadenopathy or thyromegaly appreciated. ?Cor: Mechanical heart sounds with LVAD hum present. ?Lungs: clear ?Abdomen: soft, nontender, nondistended. No hepatosplenomegaly. No bruits or masses. Good bowel sounds. ?Driveline: C/D/I; securement device intact and driveline incorporated ?Extremities: no cyanosis, clubbing, rash, edema ?Neuro: alert & orientedx3, cranial nerves grossly intact. moves all 4 extremities w/o difficulty. Affect pleasant ? ? ?Telemetry  ? ?Afib 80s-90s  ? ?EKG  ?  ?No new EKG  ? ?Labs  ? ?Basic Metabolic Panel: ?Recent Labs  ?Lab 10/30/21 ?1046 10/30/21 ?1054 10/31/21 ?0118  ?NA 140 137 138  ?K 4.0 3.9 3.1*  ?CL 102 103 99  ?CO2 24  --  28  ?GLUCOSE 110* 113* 129*  ?BUN 68*  65* 25*  ?CREATININE 7.70* 8.10* 3.89*  ?CALCIUM 9.7  --  8.9  ? ? ?Liver Function Tests: ?Recent Labs  ?Lab 10/30/21 ?1046  ?AST 28  ?ALT 13  ?ALKPHOS 95  ?BILITOT 1.1  ?PROT 7.9  ?ALBUMIN 3.6  ? ?No results for input(s): LIPASE, AMYLASE in the last 168 hours. ?No results for input(s): AMMONIA in the last 168 hours. ? ?CBC: ?Recent Labs  ?Lab 10/30/21 ?1046 10/30/21 ?1054 10/31/21 ?0118  ?WBC 8.1  --  6.8  ?HGB 10.8* 11.6* 10.9*  ?HCT 33.8* 34.0* 32.8*  ?MCV 92.3  --  90.1  ?PLT 133*  --  138*  ? ? ?INR: ?Recent Labs  ?Lab 10/30/21 ?1046 10/30/21 ?1344 10/31/21 ?0118  ?INR 1.8* 1.8* 1.8*  ? ? ?Other results: ?EKG:  ? ? ?Imaging  ? ?CT Head Wo Contrast ? ?Result Date: 10/30/2021 ?CLINICAL DATA:  Head trauma, moderate/severe. Neck trauma, impaired range of motion. Evaluate for injury status post fall. EXAM: CT HEAD WITHOUT CONTRAST CT CERVICAL SPINE WITHOUT CONTRAST TECHNIQUE: Multidetector CT imaging of the head and cervical spine was performed following the standard protocol without intravenous contrast. Multiplanar CT image reconstructions of the cervical spine were also generated. RADIATION DOSE REDUCTION: This exam was performed according to the departmental dose-optimization program which includes automated exposure control, adjustment of the mA and/or kV according to patient size and/or use of iterative reconstruction technique. COMPARISON:  Prior head CT  examinations 09/19/2021 and earlier. FINDINGS: CT HEAD FINDINGS Brain: Mild generalized cerebral and cerebellar atrophy. Mild to moderate patchy and ill-defined hypoattenuation within the cerebral white matter, nonspecific but compatible with chronic small vessel ischemic disease. There is no acute intracranial hemorrhage. No demarcated cortical infarct. No extra-axial fluid collection. No evidence of an intracranial mass. No midline shift. Vascular: No hyperdense vessel.  Atherosclerotic calcifications. Skull: Normal. Negative for fracture or focal lesion.  Sinuses/Orbits: Visualized orbits show no acute finding. 1.1 cm mucous retention cyst within the left maxillary sinus. Trace mucosal thickening and tiny mucous retention cysts within the bilateral ethmoid air cells. CT CERVICAL SPINE FINDINGS Alignment: Straightening of the expected cervical lordosis. No significant spondylolisthesis. Skull base and vertebrae: The basion-dental and atlanto-dental intervals are maintained.No evidence of acute fracture to the cervical spine. Soft tissues and spinal canal: No prevertebral fluid or swelling. No visible canal hematoma. Disc levels: Cervical spondylosis. No more than mild disc space narrowing. Shallow multilevel disc bulges/central disc protrusions. Multilevel uncovertebral hypertrophy, greatest on the right at C3-C4. No significant spinal canal stenosis. No high-grade bony neural foraminal narrowing. Upper chest: No consolidation within the imaged lung apices. No visible pneumothorax. Other: Atherosclerotic plaque within the carotid and vertebral arteries. Chronic, healed fracture deformity of the posterior left first rib. IMPRESSION: CT head: 1. No evidence of acute intracranial abnormality. 2. Mild-to-moderate chronic small vessel ischemic changes within the cerebral white matter. 3. Mild generalized parenchymal atrophy. 4. Paranasal sinus disease at the imaged levels, as described. CT cervical spine: 1. No evidence of acute fracture to the cervical spine. 2. Nonspecific straightening of the expected cervical lordosis. 3. Cervical spondylosis, as described. Electronically Signed   By: Kellie Simmering D.O.   On: 10/30/2021 11:42  ? ?CT Cervical Spine Wo Contrast ? ?Result Date: 10/30/2021 ?CLINICAL DATA:  Head trauma, moderate/severe. Neck trauma, impaired range of motion. Evaluate for injury status post fall. EXAM: CT HEAD WITHOUT CONTRAST CT CERVICAL SPINE WITHOUT CONTRAST TECHNIQUE: Multidetector CT imaging of the head and cervical spine was performed following the  standard protocol without intravenous contrast. Multiplanar CT image reconstructions of the cervical spine were also generated. RADIATION DOSE REDUCTION: This exam was performed according to the departmental dose-optimization program which includes automated exposure control, adjustment of the mA and/or kV according to patient size and/or use of iterative reconstruction technique. COMPARISON:  Prior head CT examinations 09/19/2021 and earlier. FINDINGS: CT HEAD FINDINGS Brain: Mild generalized cerebral and cerebellar atrophy. Mild to moderate patchy and ill-defined hypoattenuation within the cerebral white matter, nonspecific but compatible with chronic small vessel ischemic disease. There is no acute intracranial hemorrhage. No demarcated cortical infarct. No extra-axial fluid collection. No evidence of an intracranial mass. No midline shift. Vascular: No hyperdense vessel.  Atherosclerotic calcifications. Skull: Normal. Negative for fracture or focal lesion. Sinuses/Orbits: Visualized orbits show no acute finding. 1.1 cm mucous retention cyst within the left maxillary sinus. Trace mucosal thickening and tiny mucous retention cysts within the bilateral ethmoid air cells. CT CERVICAL SPINE FINDINGS Alignment: Straightening of the expected cervical lordosis. No significant spondylolisthesis. Skull base and vertebrae: The basion-dental and atlanto-dental intervals are maintained.No evidence of acute fracture to the cervical spine. Soft tissues and spinal canal: No prevertebral fluid or swelling. No visible canal hematoma. Disc levels: Cervical spondylosis. No more than mild disc space narrowing. Shallow multilevel disc bulges/central disc protrusions. Multilevel uncovertebral hypertrophy, greatest on the right at C3-C4. No significant spinal canal stenosis. No high-grade bony neural foraminal narrowing. Upper chest: No consolidation  within the imaged lung apices. No visible pneumothorax. Other: Atherosclerotic plaque  within the carotid and vertebral arteries. Chronic, healed fracture deformity of the posterior left first rib. IMPRESSION: CT head: 1. No evidence of acute intracranial abnormality. 2. Mild-to-moderate chroni

## 2021-10-31 NOTE — Progress Notes (Signed)
Pt receives out-pt HD at [facility]) on MWF. Pt arrives at 12:00 for 12:20 chair time. Will assist as needed.  ? ?Melven Sartorius ?Renal Navigator ?[phone] ?

## 2021-10-31 NOTE — Progress Notes (Signed)
I was made aware patient had MAP and doppler pressure 100s this afternoon. Will decrease midodrine to 5 mg TID on nondialysis days and continue 10 mg TID dialysis days ?

## 2021-10-31 NOTE — Evaluation (Signed)
Physical Therapy Evaluation ?Patient Details ?Name: Marcus Johnson. ?MRN: [id number] ?DOB: 11/20/1960 ?Today's Date: 10/31/2021 ? ?History of Present Illness ? Pt is a 61 y.o. M who presents 10/30/2021 with multiple falls; thought to be possible seizure activity vs syncopal episodes. Found to have left MT fxs with left 5th and possible 4th MT base fxs. CT head negative for acute abnoramlity. Significant PMH: chronic systolic CHF/ICM s/p Medtronic single chamber ICD, HM III LVAD, CAD s/p CABG, ESRD on HD.  ?Clinical Impression ? PTA, pt lives with his brother and son, is ambulatory and negotiates 3 flights of stairs 3x/wk for dialysis to enter and exit his apartment, and requires assist for lower body ADL's and LVAD management. Pt presents with decreased functional mobility secondary to pain, gait abnormalities, impaired balance, and orthostatic hypotension (see below for further detail). Pt requiring two person minimal assist for stand pivot transfers using a walker. Recommend post acute rehab to address deficits and maximize functional mobility; suspect good progress based on pt PLOF, high motivation, and age.  ? ?Of note: Pt with decreased responsiveness x 60 seconds when standing; pt staring blankly into space, not responding to questions, and exhibiting rhythmic movements of mouth and tongue and odd breathing patterns. Following this minute, pt alert, answering questions and back to baseline cognition.  ? ?Vitals: ?Supine: 78/67 (72) ?Sitting: 111/59 (75) ?Standing: 57/49 (54) ?Sitting edge of bed x 3 minutes: 87/73 (79) ?Sitting in recliner post mobility: 85/67 (74) ?   ? ?Recommendations for follow up therapy are one component of a multi-disciplinary discharge planning process, led by the attending physician.  Recommendations may be updated based on patient status, additional functional criteria and insurance authorization. ? ?Follow Up Recommendations Acute inpatient rehab (3hours/day) ? ?  ?Assistance  Recommended at Discharge Frequent or constant Supervision/Assistance  ?Patient can return home with the following ? A little help with walking and/or transfers;A little help with bathing/dressing/bathroom;Assistance with cooking/housework;Assist for transportation;Help with stairs or ramp for entrance ? ?  ?Equipment Recommendations None recommended by PT  ?Recommendations for Other Services ? Rehab consult  ?  ?Functional Status Assessment Patient has had a recent decline in their functional status and demonstrates the ability to make significant improvements in function in a reasonable and predictable amount of time.  ? ?  ?Precautions / Restrictions Precautions ?Precautions: Fall (Orthostatic, LVAD) ?Required Braces or Orthoses: Other Brace ?Other Brace: L CAM boot ?Restrictions ?Weight Bearing Restrictions: Yes ?LLE Weight Bearing: Weight bearing as tolerated  ? ?  ? ?Mobility ? Bed Mobility ?Overal bed mobility: Needs Assistance ?Bed Mobility: Supine to Sit ?  ?  ?Supine to sit: Min assist, HOB elevated ?  ?  ?General bed mobility comments: Pt reaching out for therapist for trunk elevation ?  ? ?Transfers ?Overall transfer level: Needs assistance ?Equipment used: Rolling walker (2 wheels) ?Transfers: Sit to/from Stand, Bed to chair/wheelchair/BSC ?Sit to Stand: Min assist, +2 safety/equipment ?  ?Step pivot transfers: Min assist, +2 safety/equipment ?  ?  ?  ?General transfer comment: Pt pulling up on RW for transfer, pain in L foot with WB, unable to maintain standing due to orthostasis and sat back down on edge of bed. On subsequent attempt, pt able to perform step pivot from bed to chair ?  ? ?Ambulation/Gait ?  ?  ?  ?  ?  ?  ?  ?General Gait Details: unable due to orthostasis ? ?Stairs ?  ?  ?  ?  ?  ? ?Wheelchair  Mobility ?  ? ?Modified Rankin (Stroke Patients Only) ?  ? ?  ? ?Balance Overall balance assessment: Needs assistance ?Sitting-balance support: No upper extremity supported, Feet  supported ?Sitting balance-Leahy Scale: Good ?  ?  ?Standing balance support: Bilateral upper extremity supported, Reliant on assistive device for balance ?Standing balance-Leahy Scale: Poor ?  ?  ?  ?  ?  ?  ?  ?  ?  ?  ?  ?  ?   ? ? ? ?Pertinent Vitals/Pain Pain Assessment ?Pain Assessment: 0-10 ?Pain Score: 4  ?Pain Location: L foot in WB ?Pain Descriptors / Indicators: Discomfort, Grimacing ?Pain Intervention(s): Limited activity within patient's tolerance, Monitored during session  ? ? ?Home Living Family/patient expects to be discharged to:: Private residence ?Living Arrangements: Other relatives;Children (brother, son) ?Available Help at Discharge: Family;Available 24 hours/day ?Type of Home: Apartment ?Home Access: Stairs to enter ?Entrance Stairs-Rails: Right;Left ?Entrance Stairs-Number of Steps: 3 flights ?  ?Home Layout: One level ?Home Equipment: Conservation officer, nature (2 wheels);Rollator (4 wheels);Wheelchair - manual;BSC/3in1;Shower seat;Grab bars - toilet ?Additional Comments: Brother drives to HD  ?  ?Prior Function Prior Level of Function : Needs assist;History of Falls (last six months) ?  ?  ?  ?  ?  ?  ?Mobility Comments: has RW, uses PRN ?ADLs Comments: Sponge bathes at sink, brother assists with LB drressing, brother/son complete IADL, brother drives to HD ?  ? ? ?Hand Dominance  ? Dominant Hand: Right (but uses Left to feed himself) ? ?  ?Extremity/Trunk Assessment  ? Upper Extremity Assessment ?Upper Extremity Assessment: Defer to OT evaluation ?  ? ?Lower Extremity Assessment ?Lower Extremity Assessment: LLE deficits/detail ?LLE Deficits / Details: in CAM boot ?  ? ?   ?Communication  ? Communication: No difficulties  ?Cognition Arousal/Alertness: Awake/alert ?Behavior During Therapy: [facility] for tasks assessed/performed ?Overall Cognitive Status: Within Functional Limits for tasks assessed ?  ?  ?  ?  ?  ?  ?  ?  ?  ?  ?  ?  ?  ?  ?  ?  ?  ?  ?  ? ?  ?General Comments General comments (skin integrity,  edema, etc.): SpO2 WFL on RA, HR WFL, Pt had a 60 second window sitting EOB immediately after standing where he was decreased-responsiveness (not responding to questions) where he was exhibiting rhymic movement of mouth and tongue and odd breathing patterns. When he did start responding to questions he did not remember this time frame or the incident. ? ?  ?Exercises    ? ?Assessment/Plan  ?  ?PT Assessment Patient needs continued PT services  ?PT Problem List Decreased strength;Decreased activity tolerance;Decreased balance;Decreased mobility;Pain ? ?   ?  ?PT Treatment Interventions DME instruction;Gait training;Functional mobility training;Therapeutic activities;Therapeutic exercise;Balance training;Stair training;Patient/family education   ? ?PT Goals (Current goals can be found in the Care Plan section)  ?Acute Rehab PT Goals ?Patient Stated Goal: return to baseline ?PT Goal Formulation: With patient ?Time For Goal Achievement: 11/14/21 ?Potential to Achieve Goals: Good ? ?  ?Frequency Min 3X/week ?  ? ? ?Co-evaluation PT/OT/SLP Co-Evaluation/Treatment: Yes ?Reason for Co-Treatment: Complexity of the patient's impairments (multi-system involvement);For patient/therapist safety;To address functional/ADL transfers ?PT goals addressed during session: Mobility/safety with mobility ?OT goals addressed during session: ADL's and self-care;Strengthening/ROM ?  ? ? ?  ?AM-PAC PT "6 Clicks" Mobility  ?Outcome Measure Help needed turning from your back to your side while in a flat bed without using bedrails?: None ?Help needed moving  from lying on your back to sitting on the side of a flat bed without using bedrails?: A Little ?Help needed moving to and from a bed to a chair (including a wheelchair)?: A Little ?Help needed standing up from a chair using your arms (e.g., wheelchair or bedside chair)?: A Little ?Help needed to walk in hospital room?: Total ?Help needed climbing 3-5 steps with a railing? : Total ?6 Click  Score: 15 ? ?  ?End of Session Equipment Utilized During Treatment: Gait belt;Other (comment) (CAM boot) ?Activity Tolerance: Other (comment) (limited by orthostasis) ?Patient left: in chair;with call bell/phone within

## 2021-10-31 NOTE — Progress Notes (Signed)
LVAD Coordinator Rounding Note: ? ?HM III LVAD implanted on 10/13/20 by Dr Cyndia Bent under Destination Therapy criteria. ? ?Pt admitted from the ED yesterday due to multiple falls and syncope. ? ?Patient lying in bed this morning, pt tells me that he still feels weak from dialysis yesterday. Pt states that his foot hurt really bad last night but he was told he could only have tylenol so he was unable to get very much rest. D/w Dr Vassie Moment will order Tramadol for the pts pain.  ? ?Pt had 60 seconds of unresponsiveness when he stood with PT this morning, pts blood pressure dropped significantly as well. See PT note. Pt tells me that he does not realize he is doing this and does not remember this happening.  ? ?Vital signs: ?Temp: 98.7 ?HR: 88 ?Doppler:  72 ?Auto cuff:  85/74 (80) ?O2 Sat: 98% on RA ?Wt: 155.8 lbs   ? ?LVAD interrogation reveals:  ?Speed: 5800 ?Flow: 5.3 ?Power: 4.6w ?PI: 3.7 ?Hematocrit: 33 ? ?Alarms: none ?Events: 100 + for today ? ?Fixed speed: 5800 ?Low speed limit: 5500 ? ?Drive Line:  Existing VAD dressing removed and site care performed using sterile technique. Drive line exit site cleaned with Chlora prep applicators x 2, allowed to dry, and gauze dressing applied. Exit site healed and incorporated, the velour is fully implanted at exit site. Scant amount of yellow drainage. No redness, tenderness, foul odor or rash noted. Drive line anchor re-applied. Continue twice weekly dressing changes. Next dressing change will be due 11/03/21. Will hopefully be able to go back to weekly dressing changes on Friday. ? ?Labs:  ? ?LDH trend: 173 ? ?INR trend: 1.8 ? ?Anticoagulation Plan: ?-INR Goal: 2.0-2.5 ?-ASA Dose: none ? ?Device:  ?-  Medtronic single ?-  Pacing: VVI 40 ?- Therapies: on 200 bpm ? ?Plan/Recommendations:  ?1. Call VAD coordinator for equipment or drive line issues ?2. Twice weekly dressing changes while using acell. Next dressing due 11/03/20. VAD coordinator will evaluate for possible  advancement back to weekly. ? ?Tanda Rockers RN ?VAD Coordinator  ?Office: [phone]  ?24/7 Pager: [phone]  ?  ? ? ? ? ? ? ? ?

## 2021-11-01 LAB — GLUCOSE, CAPILLARY
Glucose-Capillary: 73 mg/dL (ref 70–99)
Glucose-Capillary: 76 mg/dL (ref 70–99)
Glucose-Capillary: 96 mg/dL (ref 70–99)
Glucose-Capillary: 93 mg/dL (ref 70–99)

## 2021-11-01 LAB — BASIC METABOLIC PANEL
Anion gap: 13 (ref 5–15)
BUN: 44 mg/dL — ABNORMAL HIGH (ref 6–20)
CO2: 24 mmol/L (ref 22–32)
Calcium: 9.3 mg/dL (ref 8.9–10.3)
Chloride: 99 mmol/L (ref 98–111)
Creatinine, Ser: 5.23 mg/dL — ABNORMAL HIGH (ref 0.61–1.24)
GFR, Estimated: 12 mL/min — ABNORMAL LOW (ref >60)
Glucose, Bld: 93 mg/dL (ref 70–99)
Potassium: 4 mmol/L (ref 3.5–5.1)
Sodium: 136 mmol/L (ref 135–145)

## 2021-11-01 LAB — LACTATE DEHYDROGENASE: LDH: 189 U/L (ref 98–192)

## 2021-11-01 LAB — CBC
HCT: 31.9 % — ABNORMAL LOW (ref 39.0–52.0)
Hemoglobin: 10.3 g/dL — ABNORMAL LOW (ref 13.0–17.0)
MCH: 29.4 pg (ref 26.0–34.0)
MCHC: 32.3 g/dL (ref 30.0–36.0)
MCV: 91.1 fL (ref 80.0–100.0)
Platelets: 147 10*3/uL — ABNORMAL LOW (ref 150–400)
RBC: 3.5 MIL/uL — ABNORMAL LOW (ref 4.22–5.81)
RDW: 13.2 % (ref 11.5–15.5)
WBC: 6.8 10*3/uL (ref 4.0–10.5)
nRBC: 0 % (ref 0.0–0.2)

## 2021-11-01 LAB — PROTIME-INR
INR: 2 — ABNORMAL HIGH (ref 0.8–1.2)
Prothrombin Time: 22.9 seconds — ABNORMAL HIGH (ref 11.4–15.2)

## 2021-11-01 LAB — HEPATITIS B SURFACE ANTIBODY, QUANTITATIVE: Hep B S AB Quant (Post): 316.4 m[IU]/mL (ref >9.9)

## 2021-11-01 MED ORDER — NAPHAZOLINE-GLYCERIN 0.012-0.25 % OP SOLN
1.0000 [drp] | Freq: Four times a day (QID) | OPHTHALMIC | Status: DC | PRN
  Administered 2021-11-02: 2 [drp] via OPHTHALMIC
  Filled 2021-11-01: qty 15

## 2021-11-01 MED ORDER — WARFARIN SODIUM 3 MG PO TABS
6.0000 mg | ORAL_TABLET | Freq: Once | ORAL | Status: AC
  Administered 2021-11-01: 6 mg via ORAL
  Filled 2021-11-01: qty 2

## 2021-11-01 MED ORDER — MIDODRINE HCL 5 MG PO TABS
2.5000 mg | ORAL_TABLET | ORAL | Status: DC
  Administered 2021-11-02 (×3): 2.5 mg via ORAL
  Filled 2021-11-01 (×3): qty 1

## 2021-11-01 NOTE — Progress Notes (Signed)
LVAD Coordinator Rounding Note: ? ?HM III LVAD implanted on 10/13/20 by Dr Cyndia Bent under Destination Therapy criteria. ? ?Pt admitted from ED to Dr. Claris Gladden service due to multiple falls and syncope. ? ?Patient in IP dialysis unit this am undergoing HD. He says he feels "ok" but does report an "episode" yesterday while working with PT that was similar to what his son described happened at home prior to his fall/ED visit.  ? ?Patient says Dr. Aundra Dubin and Nephrologist talked about increasing dry weight and his HD treatment today will reflect changes.  ? ?Patient says he is concerned about 3 flights of stairs at home with the fractures in his left foot. He has minimal weight bearing even with boot in place. He reports Dr. Aundra Dubin ordered consult for IP rehab this admission. Pt and family discussed options of alternative places to stay (on one level) and have been unable to come up with any. Third story home is only option for patient if discharged home (no elevator). If IP rehab not an option, skilled nursing may be next option. Updated Jonnie Finner, Case Manager.  ? ? ?Vital signs: ?Temp: 97.3 ?HR: 76 ?Doppler:  108 ?Auto cuff:  112/88 (95) ?O2 Sat: 100% on RA ?Wt: 156.5>155.8>160.5>168.6 lbs   ? ?LVAD interrogation reveals:  ?Speed: 5800 ?Flow: 5.1 ?Power: 4.9w ?PI: 3.3 ?Hematocrit: 33 ? ?Alarms: none ?Events: on batteries in HD ? ?Fixed speed: 5800 ?Low speed limit: 5500 ? ?Drive Line:  Existing VAD dressing C/D/I with anchor intact and accurately applied. Continue twice weekly dressing changes. Next dressing change will be due 11/03/21. Will hopefully be able to go back to weekly dressing changes on Friday. ? ?Labs:  ? ?LDH trend: [phone] ? ?INR trend: 1.8>1.8>2.0 ? ?Anticoagulation Plan: ?-INR Goal: 2.0-2.5 ?-ASA Dose: none ? ?Device:  ?-  Medtronic single ?-  Pacing: VVI 40 ?- Therapies: on 200 bpm ? ?Plan/Recommendations:  ?1. Call VAD coordinator for equipment or drive line issues ?2. Twice weekly dressing  changes while using acell. Next dressing due 11/03/20. VAD coordinator will evaluate for possible advancement back to weekly. ? ?Zada Girt, RN ?VAD Coordinator  ?Office: [phone]  ?24/7 Pager: [phone]  ?  ? ? ? ? ? ? ? ?

## 2021-11-01 NOTE — Progress Notes (Signed)
Physical Therapy Treatment ?Patient Details ?Name: Marcus Johnson. ?MRN: [id number] ?DOB: [birth year] ?Today's Date: 11/01/2021 ? ? ?History of Present Illness Pt is a 61 y.o. M who presents 10/30/2021 with multiple falls; thought to be possible seizure activity vs syncopal episodes. Found to have left MT fxs with left 5th and possible 4th MT base fxs. CT head negative for acute abnoramlity. Significant PMH: chronic systolic CHF/ICM s/p Medtronic single chamber ICD, HM III LVAD, CAD s/p CABG, ESRD on HD. ? ?  ?PT Comments  ? ? Pt making good, steady progress with mobility this date, ambulating up to ~40 ft with a RW and minA. He does ambulate at a slow pace and with feet shuffling. A slow gait speed is indicative of being at risk for falls. Pt's BP remained stable today with no significant events during session. Pt requiring minA for bed mobility and transfers as well. Will continue to follow acutely. Current recommendations remain appropriate. ?   ?Recommendations for follow up therapy are one component of a multi-disciplinary discharge planning process, led by the attending physician.  Recommendations may be updated based on patient status, additional functional criteria and insurance authorization. ? ?Follow Up Recommendations ? Acute inpatient rehab (3hours/day) ?  ?  ?Assistance Recommended at Discharge Frequent or constant Supervision/Assistance  ?Patient can return home with the following A little help with walking and/or transfers;A little help with bathing/dressing/bathroom;Assistance with cooking/housework;Assist for transportation;Help with stairs or ramp for entrance ?  ?Equipment Recommendations ? None recommended by PT  ?  ?Recommendations for Other Services   ? ? ?  ?Precautions / Restrictions Precautions ?Precautions: Fall ?Precaution Comments: Orthostatic (not today's session 3/29), LVAD ?Required Braces or Orthoses: Other Brace ?Other Brace: L CAM boot ?Restrictions ?Weight Bearing  Restrictions: Yes ?LLE Weight Bearing: Weight bearing as tolerated  ?  ? ?Mobility ? Bed Mobility ?Overal bed mobility: Needs Assistance ?Bed Mobility: Supine to Sit ?  ?  ?Supine to sit: Min assist, HOB elevated ?  ?  ?General bed mobility comments: Pt reaching out for therapist for trunk elevation ?  ? ?Transfers ?Overall transfer level: Needs assistance ?Equipment used: 1 person hand held assist ?Transfers: Sit to/from Stand ?Sit to Stand: Min assist, +2 safety/equipment ?  ?  ?  ?  ?  ?General transfer comment: Cues to push up from bed and steady self on PT anterior to him, minA to power up to stand and steady. ?  ? ?Ambulation/Gait ?Ambulation/Gait assistance: Min assist, +2 safety/equipment ?Gait Distance (Feet): 40 Feet ?Assistive device: Rolling walker (2 wheels), 1 person hand held assist ?Gait Pattern/deviations: Step-to pattern, Decreased step length - right, Decreased step length - left, Decreased stride length, Decreased weight shift to left, Shuffle, Trunk flexed ?Gait velocity: reduced ?Gait velocity interpretation: <1.31 ft/sec, indicative of household ambulator ?  ?General Gait Details: Pt initially ambulating forward with hands with support on PT anterior to him. Transitioned to RW with pt displaying a slow, shuffling gait pattern with poor bil feet clearance. MinA to steady. Extra time to manage RW with many steps to turn. ? ? ?Stairs ?  ?  ?  ?  ?  ? ? ?Wheelchair Mobility ?  ? ?Modified Rankin (Stroke Patients Only) ?  ? ? ?  ?Balance Overall balance assessment: Needs assistance ?Sitting-balance support: No upper extremity supported, Feet supported ?Sitting balance-Leahy Scale: Good ?  ?  ?Standing balance support: Bilateral upper extremity supported, Reliant on assistive device for balance ?Standing balance-Leahy Scale: Poor ?Standing balance  comment: Reliant on RW ?  ?  ?  ?  ?  ?  ?  ?  ?  ?  ?  ?  ? ?  ?Cognition Arousal/Alertness: Awake/alert ?Behavior During Therapy: [facility] for tasks  assessed/performed ?Overall Cognitive Status: Within Functional Limits for tasks assessed ?  ?  ?  ?  ?  ?  ?  ?  ?  ?  ?  ?  ?  ?  ?  ?  ?General Comments: Pt repeating comments and questions at times, will need to assess further in future sessions if this is baseline cog ?  ?  ? ?  ?Exercises   ? ?  ?General Comments General comments (skin integrity, edema, etc.): BP stable with transitions today, no lightheadedness ?  ?  ? ?Pertinent Vitals/Pain Pain Assessment ?Pain Assessment: 0-10 ?Pain Score: 4  ?Pain Location: L foot in WB ?Pain Descriptors / Indicators: Discomfort, Grimacing ?Pain Intervention(s): Limited activity within patient's tolerance, Monitored during session, Premedicated before session, Repositioned  ? ? ?Home Living   ?Living Arrangements:  (Son, [name] and his wife  and 2 children 64 and 55 as well as his brother, [name] live with patient) ?  ?  ?  ?  ?  ?  ?  ?  ?   ?  ?Prior Function    ?  ?  ?   ? ?PT Goals (current goals can now be found in the care plan section) Acute Rehab PT Goals ?Patient Stated Goal: to improve ?PT Goal Formulation: With patient ?Time For Goal Achievement: 11/14/21 ?Potential to Achieve Goals: Good ?Progress towards PT goals: Progressing toward goals ? ?  ?Frequency ? ? ? Min 3X/week ? ? ? ?  ?PT Plan Current plan remains appropriate  ? ? ?Co-evaluation   ?  ?  ?  ?  ? ?  ?AM-PAC PT "6 Clicks" Mobility   ?Outcome Measure ? Help needed turning from your back to your side while in a flat bed without using bedrails?: None ?Help needed moving from lying on your back to sitting on the side of a flat bed without using bedrails?: A Little ?Help needed moving to and from a bed to a chair (including a wheelchair)?: A Little ?Help needed standing up from a chair using your arms (e.g., wheelchair or bedside chair)?: A Little ?Help needed to walk in hospital room?: A Little ?Help needed climbing 3-5 steps with a railing? : Total ?6 Click Score: 17 ? ?  ?End of Session Equipment  Utilized During Treatment: Other (comment) (CAM boot) ?Activity Tolerance: Patient tolerated treatment well ?Patient left: in chair;with call bell/phone within reach;with chair alarm set ?Nurse Communication: Mobility status;Other (comment) (BP stable) ?PT Visit Diagnosis: Unsteadiness on feet (R26.81);Other abnormalities of gait and mobility (R26.89);History of falling (Z91.81);Difficulty in walking, not elsewhere classified (R26.2) ?  ? ? ?Time: 2248-2500 ?PT Time Calculation (min) (ACUTE ONLY): 28 min ? ?Charges:  $Gait Training: 8-22 mins ?$Therapeutic Activity: 8-22 mins          ?          ? ?Moishe Spice, PT, DPT ?Acute Rehabilitation Services  ?Pager: [phone] ?Office: [phone] ? ? ? ?Maretta Bees Pettis ?11/01/2021, 3:34 PM ? ?

## 2021-11-01 NOTE — Progress Notes (Signed)
Mobility Specialist: Progress Note ? ? 11/01/21 1703  ?Mobility  ?Activity Transferred from chair to bed  ?Level of Assistance Minimal assist, patient does 75% or more  ?Assistive Device Other (Comment) ?(HHA)  ?Distance Ambulated (ft) 2 ft  ?Activity Response Tolerated well  ?$Mobility charge 1 Mobility  ? ?Received pt in chair having no complaints and agreeable to transfer. Asymptomatic during transfer, returned back to bed w/ call bell in reach and all needs met.  ? ?Harrell Gave Arvil Utz ?Mobility Specialist ?Mobility Specialist [place]: [phone] ?Mobility Specialist [place]: [phone] ? ?

## 2021-11-01 NOTE — Progress Notes (Signed)
OT Cancellation Note ? ?Patient Details ?Name: Marcus Johnson. ?MRN: [id number] ?DOB: 24-Jan-1961 ? ? ?Cancelled Treatment:    Reason Eval/Treat Not Completed: Patient at procedure or test/ unavailable (HD). OT will continue to follow acutely, POC AIR post-acute remains appropriate at this time.  ? ?[name] ?11/01/2021, 10:00 AM ? ?Jesse Sans OTR/L ?Acute Rehabilitation Services ?Pager: [phone] ?Office: [phone] ? ?

## 2021-11-01 NOTE — PMR Pre-admission (Shared)
PMR Admission Coordinator Pre-Admission Assessment ? ?Patient: Marcus Johnson. is an 61 y.o., male ?MRN: [id number] ?DOB: 01-10-1961 ?Height: '5\' 10"'$  (177.8 cm) ?Weight: 73 kg ? ?Insurance Information ?HMO: yes    PPO:      PCP:      IPA:      80/20:      OTHER:  ?PRIMARY: [employer] Medicare      Policy#: [id number]      Subscriber: pt ?CM Name: ***      Phone#: [phone] ext *     Fax#: [phone] ?Pre-Cert#: [id number]      Employer:  ?Benefits:  Phone #: [phone]     Name: 3/29 ?Eff. Date: 08/06/2021     Deduct: none      Out of Pocket Max: $3400      Life Max: none ?CIR: $295 co pay per day days 1 until 6      SNF: no co pay per day days 1 until 20; $196 co pay per day days 21 until 100 ?Outpatient: $10 to $20 per visit     Co-Pay: visits per medical neccesity ?Home Health: 100%      Co-Pay: visits per medical neccesity ?DME: 80%     Co-Pay: 20% ?Providers: in network ? ?SECONDARY: Medicaid [place] for family planning   Policy#: [id number]      ? ?Financial Counselor:       Phone#:  ? ?The ?Data Collection Information Summary? for patients in Inpatient Rehabilitation Facilities with attached ?Privacy Act [place] Records? was provided and verbally reviewed with: Patient ? ?Emergency Contact Information ?Contact Information   ? ? Name Relation Home Work Mobile  ? Guillermo,[name] [phone]  [phone]  ? sloane, junkin Brother   [phone]  ? ?  ? ?Current Medical History  ?Patient Admitting Diagnosis: Falls, debility ? ?History of Present Illness: 61 year old male with history of DM2, CAD s/p CABG, systolic HF due to ischemic cardiomyopathy with EF 20 to 25 % and CKD. S/p Medtronic single chamber ICD. Heart mate III LVAD placement 10/13/2020. Became ESRD requiring hemodialysis postoperatively 3/22.  ? ?Presented on 10/30/2021 with son noting episodes of possible seizure activity at home beginning after hemodialysis on 3/24. Patient would appear to convulse and stare into space. On 3/25 he fell  and hit his head on the wall in the bathroom.. Started on empiric vancomycin for suspected LVAD drive line infection. Wound culture negative. To transition to doxycycline at discharge to complete 2 weeks treatment.  ? ?Midodrine initially increased but with elevated MAPs > 100 reduced back on non HD days. Nephrology consulted and dry weight increased.Slight dizziness noted with PT. Head CT negative. Left Metatarsal fractures with xrays showed left 5th and possibly 4th MT base fxs. Orthopedics consulted and plan conservative management with WBAT in [place] boot.  ? ?Patient's medical record from [facility]  has been reviewed by the rehabilitation admission coordinator and physician. ? ?Past Medical History  ?Past Medical History:  ?Diagnosis Date  ? AICD (automatic cardioverter/defibrillator) present   ? Single-chamber  implantable cardiac defibrillator - Medtronic  ? Atrial fibrillation ([place])   ? Cataract   ? Mixed form OU  ? CHF (congestive heart failure) ([place])   ? Chronic kidney disease   ? Chronic kidney disease (CKD), stage III (moderate) (HCC)   ? Chronic systolic heart failure ([place])   ? a. Echo 5/13: Mild LVE, mild LVH, EF 10%, anteroseptal, lateral, apical AK, mild MR, mild LAE, moderately reduced  RVSF, mild RAE, PASP 60;  b. 07/2014 Echo: EF 20-2%, diff HK, AKI of antsep/apical/mid-apicalinferior, mod reduced RV.  ? Coronary artery disease   ? a. s/p CABG 2002 b. LHC 5/13:  dLM 80%, LAD subtotally occluded, pCFX occluded, pRCA 50%, mid? Occlusion with high take off of the PDA with 70% multiple lesions-not bypassed and supplies collaterals to LAD, LIMA-IM/ramus ok, S-OM ok, S-PLA branches ok. Medical therapy was recommended  ? COVID   ? Diabetic retinopathy ([place])   ? NPDR OD, PDR OS  ? Dyspnea   ? Gout   ? "on daily RX" (01/08/2018)  ? Hypertension   ? Hypertensive retinopathy   ? OU  ? Ischemic cardiomyopathy   ? a. Echo 5/13: Mild LVE, mild LVH, EF 10%, anteroseptal, lateral, apical AK, mild MR, mild  LAE, moderately reduced RVSF, mild RAE, PASP 60;  b. 01/2012 s/p MDT [id number] Protecta XT VR AICD;  c. 07/2014 Echo: EF 20-2%, diff HK, AKI of antsep/apical/mid-apicalinferior, mod reduced RV.  ? MRSA (methicillin resistant Staphylococcus aureus)   ? Status post right foot plantar deep infection with MRSA status post  I&D 10/2008  ? Myocardial infarction [facility]   ? "was told I'd had several before heart OR in 2002" (01/08/2018)  ? Noncompliance   ? Peripheral neuropathy   ? Retinopathy, diabetic, background ([place])   ? Syncope   ? Type II diabetes mellitus ([place])   ? requiring insulin   ? Vitreous hemorrhage, left (HCC)   ? and proliferative diabetic retinopathy  ? ?Has the patient had major surgery during 100 days prior to admission? No ? ?Family History   ?family history includes Diabetes in his father; Hypertension in his mother. ? ?Current Medications ? ?Current Facility-Administered Medications:  ?  0.9 %  sodium chloride infusion, 250 mL, Intravenous, PRN, Joette Catching, PA-C ?  acetaminophen (TYLENOL) tablet 650 mg, 650 mg, Oral, Q4H PRN, Joette Catching, PA-C, 650 mg at 10/31/21 2951 ?  atorvastatin (LIPITOR) tablet 40 mg, 40 mg, Oral, Daily, Lyda Jester M, PA-C, 40 mg at 11/02/21 0930 ?  Chlorhexidine Gluconate Cloth 2 % PADS 6 each, 6 each, Topical, Q0600, Valentina Gu, NP, 6 each at 11/02/21 [phone] ?  gabapentin (NEURONTIN) capsule 100 mg, 100 mg, Oral, Daily, Joette Catching, PA-C, 100 mg at 11/02/21 0930 ?  gabapentin (NEURONTIN) capsule 200 mg, 200 mg, Oral, QHS, Joette Catching, [place], 200 mg at 11/01/21 2042 ?  insulin aspart (novoLOG) injection 0-5 Units, 0-5 Units, Subcutaneous, QHS, Joette Catching, PA-C ?  insulin aspart (novoLOG) injection 0-9 Units, 0-9 Units, Subcutaneous, TID WC, Joette Catching, PA-C ?  midodrine (PROAMATINE) tablet 10 mg, 10 mg, Oral, 3 times per day on Mon Wed Fri, Joette Catching, PA-C, 10 mg at 11/01/21 1655 ?  midodrine  (PROAMATINE) tablet 2.5 mg, 2.5 mg, Oral, 3 times per day on Sun Tue Thu Sat, McLean, Dalton S, MD, 2.5 mg at 11/02/21 1259 ?  naphazoline-glycerin (CLEAR EYES REDNESS) ophth solution 1-2 drop, 1-2 drop, Right Eye, QID PRN, Larey Dresser, MD, 2 drop at 11/02/21 6606 ?  ondansetron [facility]) injection 4 mg, 4 mg, Intravenous, Q6H PRN, Joette Catching, PA-C ?  pantoprazole (PROTONIX) EC tablet 40 mg, 40 mg, Oral, Daily, Joette Catching, [place], 40 mg at 11/02/21 0930 ?  sodium chloride flush (NS) 0.9 % injection 3 mL, 3 mL, Intravenous, Q12H, Joette Catching, PA-C, 3 mL at 11/02/21 3016 ?  sodium chloride flush (  NS) 0.9 % injection 3 mL, 3 mL, Intravenous, PRN, Joette Catching, PA-C ?  traMADol Veatrice [place]) tablet 50 mg, 50 mg, Oral, Q12H PRN, Larey Dresser, MD, 50 mg at 11/01/21 1245 ?  vancomycin (VANCOREADY) IVPB 750 mg/150 mL, 750 mg, Intravenous, Q M,W,F-HD, Larey Dresser, MD, Stopped at 11/01/21 1248 ?  warfarin (COUMADIN) tablet 6 mg, 6 mg, Oral, ONCE-1600, Larey Dresser, MD ?  Warfarin - Pharmacist Dosing Inpatient, , Does not apply, q1600, Larey Dresser, MD ? ?Patients Current Diet:  ?Diet Order   ? ?       ?  Diet heart healthy/carb modified Room service appropriate? Yes; Fluid consistency: Thin  Diet effective now       ?  ? ?  ?  ? ?  ? ?Precautions / Restrictions ?Precautions ?Precautions: Fall ?Precaution Comments: LVAD, monitor BP ?Other Brace: L CAM boot ?Restrictions ?Weight Bearing Restrictions: Yes ?LLE Weight Bearing: Weight bearing as tolerated  ? ?Has the patient had 2 or more falls or a fall with injury in the past year? Yes ? ?Prior Activity Level ?Community (5-7x/wk): Mod I with RW prn ? ?Prior Functional Level ?Self Care: Did the patient need help bathing, dressing, using the toilet or eating? Needed some help ? ?Indoor Mobility: Did the patient need assistance with walking from room to room (with or without device)? Needed some help ? ?Stairs: Did the patient  need assistance with internal or external stairs (with or without device)? Needed some help ? ?Functional Cognition: Did the patient need help planning regular tasks such as shopping or remembering to take

## 2021-11-01 NOTE — Progress Notes (Signed)
ANTICOAGULATION CONSULT NOTE - Initial Consult ? ?Pharmacy Consult for warfarin ?Indication:  LVAD HM3 ? ?Allergies  ?Allergen Reactions  ? Meclizine Hcl Anaphylaxis and Swelling  ? Ivabradine Nausea Only  ? ? ?Patient Measurements: ?Height: '5\' 10"'$  (177.8 cm) ?Weight: 76.5 kg (168 lb 10.4 oz) ?IBW/kg (Calculated) : 73 ?Heparin Dosing Weight: 71.7 kg ? ?Vital Signs: ?Temp: 98.6 ?F (37 ?C) (03/29 1240) ?Temp Source: Oral (03/29 1240) ?BP: 102/89 (03/29 1240) ?Pulse Rate: 80 (03/29 1240) ? ?Labs: ?Recent Labs  ?  10/30/21 ?1046 10/30/21 ?1054 10/30/21 ?1059 10/30/21 ?1344 10/31/21 ?0118 11/01/21 ?0960  ?HGB 10.8* 11.6*  --   --  10.9* 10.3*  ?HCT 33.8* 34.0*  --   --  32.8* 31.9*  ?PLT 133*  --   --   --  138* 147*  ?LABPROT 20.8*  --   --  21.1* 20.8* 22.9*  ?INR 1.8*  --   --  1.8* 1.8* 2.0*  ?CREATININE 7.70* 8.10*  --   --  3.89* 5.23*  ?TROPONINIHS  --   --  463* 4,540*  --   --   ? ? ? ?Estimated Creatinine Clearance: 15.5 mL/min (A) (by C-G formula based on SCr of 5.23 mg/dL (H)). ? ? ?Medical History: ?Past Medical History:  ?Diagnosis Date  ? AICD (automatic cardioverter/defibrillator) present   ? Single-chamber  implantable cardiac defibrillator - Medtronic  ? Atrial fibrillation ([place])   ? Cataract   ? Mixed form OU  ? CHF (congestive heart failure) ([place])   ? Chronic kidney disease   ? Chronic kidney disease (CKD), stage III (moderate) (HCC)   ? Chronic systolic heart failure ([place])   ? a. Echo 5/13: Mild LVE, mild LVH, EF 10%, anteroseptal, lateral, apical AK, mild MR, mild LAE, moderately reduced RVSF, mild RAE, PASP 60;  b. 07/2014 Echo: EF 20-2%, diff HK, AKI of antsep/apical/mid-apicalinferior, mod reduced RV.  ? Coronary artery disease   ? a. s/p CABG 2002 b. LHC 5/13:  dLM 80%, LAD subtotally occluded, pCFX occluded, pRCA 50%, mid? Occlusion with high take off of the PDA with 70% multiple lesions-not bypassed and supplies collaterals to LAD, LIMA-IM/ramus ok, S-OM ok, S-PLA branches ok. Medical therapy  was recommended  ? COVID   ? Diabetic retinopathy ([place])   ? NPDR OD, PDR OS  ? Dyspnea   ? Gout   ? "on daily RX" (01/08/2018)  ? Hypertension   ? Hypertensive retinopathy   ? OU  ? Ischemic cardiomyopathy   ? a. Echo 5/13: Mild LVE, mild LVH, EF 10%, anteroseptal, lateral, apical AK, mild MR, mild LAE, moderately reduced RVSF, mild RAE, PASP 60;  b. 01/2012 s/p MDT [id number] Protecta XT VR AICD;  c. 07/2014 Echo: EF 20-2%, diff HK, AKI of antsep/apical/mid-apicalinferior, mod reduced RV.  ? MRSA (methicillin resistant Staphylococcus aureus)   ? Status post right foot plantar deep infection with MRSA status post  I&D 10/2008  ? Myocardial infarction [facility])   ? "was told I'd had several before heart OR in 2002" (01/08/2018)  ? Noncompliance   ? Peripheral neuropathy   ? Retinopathy, diabetic, background ([place])   ? Syncope   ? Type II diabetes mellitus ([place])   ? requiring insulin   ? Vitreous hemorrhage, left (HCC)   ? and proliferative diabetic retinopathy  ? ? ?Medications:  ?Scheduled:  ? atorvastatin  40 mg Oral Daily  ? Chlorhexidine Gluconate Cloth  6 each Topical Q0600  ? gabapentin  100 mg Oral Daily  ?  gabapentin  200 mg Oral QHS  ? insulin aspart  0-5 Units Subcutaneous QHS  ? insulin aspart  0-9 Units Subcutaneous TID WC  ? midodrine  10 mg Oral 3 times per day on Mon Wed Fri  ? [START ON 11/02/2021] midodrine  5 mg Oral 3 times per day on Sun Tue Thu Sat  ? pantoprazole  40 mg Oral Daily  ? sodium chloride flush  3 mL Intravenous Q12H  ? Warfarin - Pharmacist Dosing Inpatient   Does not apply [id number]  ? ? ?Assessment: ?41 yom presenting after fall, head CT negative. Was found to have mild displaced fractures at 5h metatarsal. On warfarin PTA for hx LVAD HM3 - LD 3/26'@1900'$ . ? ?INR therapeutic today at 2.0. Warfarin dose of 5 mg ordered for 3/27, but not given. Hgb stable at 10.3, but plt stable in the 130-140s. LDH stable in the 170-180ss. No s/sx of bleeding. Will give higher dose tonight given missed dose on  3/27. ? ?PTA regimen 4.5 mg daily except 6 mg on Tuesday. ? ?Goal of Therapy:  ?INR 2-2.5 ?Monitor platelets by anticoagulation protocol: Yes ?  ?Plan:  ?Warfarin 6 mg tonight ?Monitor daily INR, CBC, and for s/sx of bleeding  ? ?Cathrine Muster, PharmD ?PGY2 Cardiology Pharmacy Resident ?Phone: [phone] ?11/01/2021  1:10 PM ? ?Please check [email or web address] for unit-specific pharmacy phone numbers. ? ?Please check AMION for all [facility] phone numbers ?After 10:00 PM, call [place] [phone] ? ? ? ?

## 2021-11-01 NOTE — Plan of Care (Signed)
?  Problem: Education: ?Goal: Knowledge of General Education information will improve ?Description: Including pain rating scale, medication(s)/side effects and non-pharmacologic comfort measures ?Outcome: Progressing ?  ?Problem: Health Behavior/Discharge Planning: ?Goal: Ability to manage health-related needs will improve ?Outcome: Progressing ?  ?Problem: Clinical Measurements: ?Goal: Will remain free from infection ?Outcome: Progressing ?Goal: Diagnostic test results will improve ?Outcome: Progressing ?Goal: Cardiovascular complication will be avoided ?Outcome: Progressing ?  ?Problem: Activity: ?Goal: Risk for activity intolerance will decrease ?Outcome: Progressing ?  ?

## 2021-11-01 NOTE — Progress Notes (Addendum)
?Advanced Heart Failure VAD Team Note ? ?PCP-Cardiologist: None  ? ?Subjective:   ? ?Started on empiric vancomycin for suspected DL infection. Cultures + for few staph aureus. Remains AF. WBC nl 6.8 ? ?Midodrine initially increased to 10 mg tid daily yesterday, but had elevated MAPs >100. Reduced back to 5 tid on non HD days. Cuff MAPs upper 60s this morning.  ? ?Feeling better. Slight dizziness after working w/ PT yesterday but no further recurrence.  ? ?Denies dyspnea.  ? ?Foot pain improved slightly. Was able to bear slight weight yesterday.  ?  ? ?LVAD INTERROGATION:  ?HeartMate III LVAD:   ?Flow 5.1 liters/min, speed 5850, power 4.7, PI 3.2.  Numerous PI events  ? ?Objective:   ? ?Vital Signs:   ?Temp:  [97.8 ?F (36.6 ?C)-98.7 ?F (37.1 ?C)] 97.8 ?F (36.6 ?C) (03/29 0420) ?Pulse Rate:  [72-194] 72 (03/29 0420) ?Resp:  [14-22] 16 (03/29 0420) ?BP: (78-106)/(61-89) 81/61 (03/29 0420) ?SpO2:  [92 %-99 %] 93 % (03/29 0420) ?Weight:  [72.8 kg] 72.8 kg (03/29 0500) ?Last BM Date : 10/30/21 ?Mean arterial Pressure  upper 60s  ? ?Intake/Output:  ? ?Intake/Output Summary (Last 24 hours) at 11/01/2021 0727 ?Last data filed at 10/31/2021 1854 ?Gross per 24 hour  ?Intake 0 ml  ?Output --  ?Net 0 ml  ?  ? ?Physical Exam  ?  ?General:  Well appearing. No resp difficulty ?HEENT: normal ?Neck: supple. JVD 8 cm . Carotids 2+ bilat; no bruits. No lymphadenopathy or thyromegaly appreciated. ?Cor: Mechanical heart sounds with LVAD hum present. ?Lungs: clear ?Abdomen: soft, nontender, nondistended. No hepatosplenomegaly. No bruits or masses. Good bowel sounds. ?Driveline: C/D/I; securement device intact and driveline incorporated ?Extremities: no cyanosis, clubbing, rash, edema ?Neuro: alert & orientedx3, cranial nerves grossly intact. moves all 4 extremities w/o difficulty. Affect pleasant ? ?Telemetry  ? ?NSR 80s  ? ?EKG  ?  ?No new EKG  ? ?Labs  ? ?Basic Metabolic Panel: ?Recent Labs  ?Lab 10/30/21 ?1046 10/30/21 ?1054  10/31/21 ?0118 11/01/21 ?5170  ?NA 140 137 138 136  ?K 4.0 3.9 3.1* 4.0  ?CL 102 103 99 99  ?CO2 24  --  28 24  ?GLUCOSE 110* 113* 129* 93  ?BUN 68* 65* 25* 44*  ?CREATININE 7.70* 8.10* 3.89* 5.23*  ?CALCIUM 9.7  --  8.9 9.3  ? ? ?Liver Function Tests: ?Recent Labs  ?Lab 10/30/21 ?1046  ?AST 28  ?ALT 13  ?ALKPHOS 95  ?BILITOT 1.1  ?PROT 7.9  ?ALBUMIN 3.6  ? ?No results for input(s): LIPASE, AMYLASE in the last 168 hours. ?No results for input(s): AMMONIA in the last 168 hours. ? ?CBC: ?Recent Labs  ?Lab 10/30/21 ?1046 10/30/21 ?1054 10/31/21 ?0118 11/01/21 ?0174  ?WBC 8.1  --  6.8 6.8  ?HGB 10.8* 11.6* 10.9* 10.3*  ?HCT 33.8* 34.0* 32.8* 31.9*  ?MCV 92.3  --  90.1 91.1  ?PLT 133*  --  138* 147*  ? ? ?INR: ?Recent Labs  ?Lab 10/30/21 ?1046 10/30/21 ?1344 10/31/21 ?0118 11/01/21 ?9449  ?INR 1.8* 1.8* 1.8* 2.0*  ? ? ?Other results: ?EKG:  ? ? ?Imaging  ? ?CT Head Wo Contrast ? ?Result Date: 10/30/2021 ?CLINICAL DATA:  Head trauma, moderate/severe. Neck trauma, impaired range of motion. Evaluate for injury status post fall. EXAM: CT HEAD WITHOUT CONTRAST CT CERVICAL SPINE WITHOUT CONTRAST TECHNIQUE: Multidetector CT imaging of the head and cervical spine was performed following the standard protocol without intravenous contrast. Multiplanar CT image reconstructions of the cervical  spine were also generated. RADIATION DOSE REDUCTION: This exam was performed according to the departmental dose-optimization program which includes automated exposure control, adjustment of the mA and/or kV according to patient size and/or use of iterative reconstruction technique. COMPARISON:  Prior head CT examinations 09/19/2021 and earlier. FINDINGS: CT HEAD FINDINGS Brain: Mild generalized cerebral and cerebellar atrophy. Mild to moderate patchy and ill-defined hypoattenuation within the cerebral white matter, nonspecific but compatible with chronic small vessel ischemic disease. There is no acute intracranial hemorrhage. No demarcated  cortical infarct. No extra-axial fluid collection. No evidence of an intracranial mass. No midline shift. Vascular: No hyperdense vessel.  Atherosclerotic calcifications. Skull: Normal. Negative for fracture or focal lesion. Sinuses/Orbits: Visualized orbits show no acute finding. 1.1 cm mucous retention cyst within the left maxillary sinus. Trace mucosal thickening and tiny mucous retention cysts within the bilateral ethmoid air cells. CT CERVICAL SPINE FINDINGS Alignment: Straightening of the expected cervical lordosis. No significant spondylolisthesis. Skull base and vertebrae: The basion-dental and atlanto-dental intervals are maintained.No evidence of acute fracture to the cervical spine. Soft tissues and spinal canal: No prevertebral fluid or swelling. No visible canal hematoma. Disc levels: Cervical spondylosis. No more than mild disc space narrowing. Shallow multilevel disc bulges/central disc protrusions. Multilevel uncovertebral hypertrophy, greatest on the right at C3-C4. No significant spinal canal stenosis. No high-grade bony neural foraminal narrowing. Upper chest: No consolidation within the imaged lung apices. No visible pneumothorax. Other: Atherosclerotic plaque within the carotid and vertebral arteries. Chronic, healed fracture deformity of the posterior left first rib. IMPRESSION: CT head: 1. No evidence of acute intracranial abnormality. 2. Mild-to-moderate chronic small vessel ischemic changes within the cerebral white matter. 3. Mild generalized parenchymal atrophy. 4. Paranasal sinus disease at the imaged levels, as described. CT cervical spine: 1. No evidence of acute fracture to the cervical spine. 2. Nonspecific straightening of the expected cervical lordosis. 3. Cervical spondylosis, as described. Electronically Signed   By: Kellie Simmering D.O.   On: 10/30/2021 11:42  ? ?CT Cervical Spine Wo Contrast ? ?Result Date: 10/30/2021 ?CLINICAL DATA:  Head trauma, moderate/severe. Neck trauma,  impaired range of motion. Evaluate for injury status post fall. EXAM: CT HEAD WITHOUT CONTRAST CT CERVICAL SPINE WITHOUT CONTRAST TECHNIQUE: Multidetector CT imaging of the head and cervical spine was performed following the standard protocol without intravenous contrast. Multiplanar CT image reconstructions of the cervical spine were also generated. RADIATION DOSE REDUCTION: This exam was performed according to the departmental dose-optimization program which includes automated exposure control, adjustment of the mA and/or kV according to patient size and/or use of iterative reconstruction technique. COMPARISON:  Prior head CT examinations 09/19/2021 and earlier. FINDINGS: CT HEAD FINDINGS Brain: Mild generalized cerebral and cerebellar atrophy. Mild to moderate patchy and ill-defined hypoattenuation within the cerebral white matter, nonspecific but compatible with chronic small vessel ischemic disease. There is no acute intracranial hemorrhage. No demarcated cortical infarct. No extra-axial fluid collection. No evidence of an intracranial mass. No midline shift. Vascular: No hyperdense vessel.  Atherosclerotic calcifications. Skull: Normal. Negative for fracture or focal lesion. Sinuses/Orbits: Visualized orbits show no acute finding. 1.1 cm mucous retention cyst within the left maxillary sinus. Trace mucosal thickening and tiny mucous retention cysts within the bilateral ethmoid air cells. CT CERVICAL SPINE FINDINGS Alignment: Straightening of the expected cervical lordosis. No significant spondylolisthesis. Skull base and vertebrae: The basion-dental and atlanto-dental intervals are maintained.No evidence of acute fracture to the cervical spine. Soft tissues and spinal canal: No prevertebral fluid or swelling. No visible  canal hematoma. Disc levels: Cervical spondylosis. No more than mild disc space narrowing. Shallow multilevel disc bulges/central disc protrusions. Multilevel uncovertebral hypertrophy, greatest  on the right at C3-C4. No significant spinal canal stenosis. No high-grade bony neural foraminal narrowing. Upper chest: No consolidation within the imaged lung apices. No visible pneumothorax. Other: Atherosclerotic

## 2021-11-01 NOTE — Progress Notes (Signed)
Inpatient Rehabilitation Admissions Coordinator  ? ?I met with patient at bedside . We discussed goals and expectations of a possible Cir admit. He was admitted to CIR previously and is in agreement. I will begin Auth with [facility] Medicare for possible Admit. ? ?Danne Baxter, RN, MSN ?Rehab Admissions Coordinator ?[phone] ?11/01/2021 1:39 PM ? ?

## 2021-11-01 NOTE — Plan of Care (Signed)
  Problem: Education: Goal: Knowledge of General Education information will improve Description: Including pain rating scale, medication(s)/side effects and non-pharmacologic comfort measures Outcome: Progressing   Problem: Health Behavior/Discharge Planning: Goal: Ability to manage health-related needs will improve Outcome: Progressing   Problem: Clinical Measurements: Goal: Ability to maintain clinical measurements within normal limits will improve Outcome: Progressing Goal: Will remain free from infection Outcome: Progressing Goal: Diagnostic test results will improve Outcome: Progressing   Problem: Activity: Goal: Risk for activity intolerance will decrease Outcome: Progressing   Problem: Nutrition: Goal: Adequate nutrition will be maintained Outcome: Progressing   Problem: Coping: Goal: Level of anxiety will decrease Outcome: Progressing   Problem: Pain Managment: Goal: General experience of comfort will improve Outcome: Progressing   Problem: Safety: Goal: Ability to remain free from injury will improve Outcome: Progressing   

## 2021-11-01 NOTE — Procedures (Signed)
I was present at this dialysis session. I have reviewed the session itself and made appropriate changes.  ? ?3K bath. No UF.  Preweight 72.8kg.  Hb stable. See how he feels after HD today.  Dispo plan in process.  ? ?Filed Weights  ? 10/30/21 2113 10/31/21 0325 11/01/21 0500  ?Weight: 71 kg 70.7 kg 72.8 kg  ? ? ?Recent Labs  ?Lab 11/01/21 ?9323  ?NA 136  ?K 4.0  ?CL 99  ?CO2 24  ?GLUCOSE 93  ?BUN 44*  ?CREATININE 5.23*  ?CALCIUM 9.3  ? ? ?Recent Labs  ?Lab 10/30/21 ?1046 10/30/21 ?1054 10/31/21 ?0118 11/01/21 ?5573  ?WBC 8.1  --  6.8 6.8  ?HGB 10.8* 11.6* 10.9* 10.3*  ?HCT 33.8* 34.0* 32.8* 31.9*  ?MCV 92.3  --  90.1 91.1  ?PLT 133*  --  138* 147*  ? ? ?Scheduled Meds: ? atorvastatin  40 mg Oral Daily  ? Chlorhexidine Gluconate Cloth  6 each Topical Q0600  ? gabapentin  100 mg Oral Daily  ? gabapentin  200 mg Oral QHS  ? insulin aspart  0-5 Units Subcutaneous QHS  ? insulin aspart  0-9 Units Subcutaneous TID WC  ? midodrine  10 mg Oral 3 times per day on Mon Wed Fri  ? [START ON 11/02/2021] midodrine  5 mg Oral 3 times per day on Sun Tue Thu Sat  ? pantoprazole  40 mg Oral Daily  ? sodium chloride flush  3 mL Intravenous Q12H  ? Warfarin - Pharmacist Dosing Inpatient   Does not apply U2025  ? ?Continuous Infusions: ? sodium chloride    ? sodium chloride    ? sodium chloride    ? vancomycin    ? ?PRN Meds:.sodium chloride, sodium chloride, sodium chloride, acetaminophen, alteplase, heparin, lidocaine (PF), lidocaine-prilocaine, naphazoline-glycerin, ondansetron (ZOFRAN) IV, pentafluoroprop-tetrafluoroeth, sodium chloride flush, traMADol   ?Pearson Grippe  MD ?11/01/2021, 8:36 AM ?  ?

## 2021-11-02 LAB — LACTATE DEHYDROGENASE: LDH: 171 U/L (ref 98–192)

## 2021-11-02 LAB — GLUCOSE, CAPILLARY
Glucose-Capillary: 78 mg/dL (ref 70–99)
Glucose-Capillary: 91 mg/dL (ref 70–99)
Glucose-Capillary: 113 mg/dL — ABNORMAL HIGH (ref 70–99)
Glucose-Capillary: 140 mg/dL — ABNORMAL HIGH (ref 70–99)

## 2021-11-02 LAB — CBC
HCT: 31.8 % — ABNORMAL LOW (ref 39.0–52.0)
Hemoglobin: 10.1 g/dL — ABNORMAL LOW (ref 13.0–17.0)
MCH: 28.9 pg (ref 26.0–34.0)
MCHC: 31.8 g/dL (ref 30.0–36.0)
MCV: 90.9 fL (ref 80.0–100.0)
Platelets: 147 10*3/uL — ABNORMAL LOW (ref 150–400)
RBC: 3.5 MIL/uL — ABNORMAL LOW (ref 4.22–5.81)
RDW: 13.2 % (ref 11.5–15.5)
WBC: 6.4 10*3/uL (ref 4.0–10.5)
nRBC: 0 % (ref 0.0–0.2)

## 2021-11-02 LAB — BASIC METABOLIC PANEL
Anion gap: 11 (ref 5–15)
BUN: 23 mg/dL — ABNORMAL HIGH (ref 6–20)
CO2: 26 mmol/L (ref 22–32)
Calcium: 9.4 mg/dL (ref 8.9–10.3)
Chloride: 100 mmol/L (ref 98–111)
Creatinine, Ser: 3.45 mg/dL — ABNORMAL HIGH (ref 0.61–1.24)
GFR, Estimated: 19 mL/min — ABNORMAL LOW (ref >60)
Glucose, Bld: 79 mg/dL (ref 70–99)
Potassium: 4 mmol/L (ref 3.5–5.1)
Sodium: 137 mmol/L (ref 135–145)

## 2021-11-02 LAB — PROTIME-INR
INR: 1.9 — ABNORMAL HIGH (ref 0.8–1.2)
Prothrombin Time: 21.5 seconds — ABNORMAL HIGH (ref 11.4–15.2)

## 2021-11-02 LAB — MAGNESIUM: Magnesium: 1.7 mg/dL (ref 1.7–2.4)

## 2021-11-02 MED ORDER — MAGNESIUM SULFATE 2 GM/50ML IV SOLN
2.0000 g | Freq: Once | INTRAVENOUS | Status: AC
  Administered 2021-11-02: 2 g via INTRAVENOUS
  Filled 2021-11-02: qty 50

## 2021-11-02 MED ORDER — WARFARIN SODIUM 3 MG PO TABS
6.0000 mg | ORAL_TABLET | Freq: Once | ORAL | Status: AC
  Administered 2021-11-02: 6 mg via ORAL
  Filled 2021-11-02: qty 2

## 2021-11-02 MED ORDER — WARFARIN SODIUM 3 MG PO TABS: 6.0000 mg | ORAL_TABLET | Freq: Once | ORAL | Status: DC

## 2021-11-02 NOTE — Progress Notes (Signed)
Patient ID: Marcus Johnson., male   DOB: Jan 27, 1961, 61 y.o.   MRN: [id number] ?  ?Advanced Heart Failure VAD Team Note ? ?PCP-Cardiologist: None  ? ?Subjective:   ? ?Started on empiric vancomycin for suspected DL infection. Wound culture NGTD.  Afebrile.  ? ?MAPs now generally around 90s, denies lightheadedness.  Was able to walk short distance yesterday.  HD went fine yesterday, dry weight has been raised.  ? ?LVAD INTERROGATION:  ?HeartMate III LVAD:   ?Flow 5.1 liters/min, speed 5800, power 4.7, PI 3.3.  Decreased PI events  ? ?Objective:   ? ?Vital Signs:   ?Temp:  [97.7 ?F (36.5 ?C)-98.6 ?F (37 ?C)] 98 ?F (36.7 ?C) (03/30 0308) ?Pulse Rate:  [62-88] 86 (03/30 0617) ?Resp:  [13-18] 17 (03/30 0308) ?BP: (74-116)/(43-98) 110/89 (03/30 0617) ?SpO2:  [97 %-100 %] 100 % (03/30 0617) ?Weight:  [73 kg-76.5 kg] 73 kg (03/30 0452) ?Last BM Date : 10/30/21 ?Mean arterial Pressure  90s ? ?Intake/Output:  ? ?Intake/Output Summary (Last 24 hours) at 11/02/2021 0811 ?Last data filed at 11/01/2021 1151 ?Gross per 24 hour  ?Intake --  ?Output 0 ml  ?Net 0 ml  ?  ? ?Physical Exam  ?  ?General: Well appearing this am. NAD.  ?HEENT: Normal. ?Neck: Supple, JVP 7-8 cm. Carotids OK.  ?Cardiac:  Mechanical heart sounds with LVAD hum present.  ?Lungs:  CTAB, normal effort.  ?Abdomen:  NT, ND, no HSM. No bruits or masses. +BS  ?LVAD exit site: Well-healed and incorporated. Dressing dry and intact. No erythema or drainage. Stabilization device present and accurately applied. Driveline dressing changed daily per sterile technique. ?Extremities:  Warm and dry. No cyanosis, clubbing, rash, or edema.  ?Neuro:  Alert & oriented x 3. Cranial nerves grossly intact. Moves all 4 extremities w/o difficulty. Affect pleasant   ? ?Telemetry  ? ?NSR 80s  ? ?EKG  ?  ?No new EKG  ? ?Labs  ? ?Basic Metabolic Panel: ?Recent Labs  ?Lab 10/30/21 ?1046 10/30/21 ?1054 10/31/21 ?0118 11/01/21 ?4481 11/02/21 ?0203  ?NA 140 137 138 136 137  ?K 4.0 3.9 3.1*  4.0 4.0  ?CL 102 103 99 99 100  ?CO2 24  --  '28 24 26  '$ ?GLUCOSE 110* 113* 129* 93 79  ?BUN 68* 65* 25* 44* 23*  ?CREATININE 7.70* 8.10* 3.89* 5.23* 3.45*  ?CALCIUM 9.7  --  8.9 9.3 9.4  ?MG  --   --   --   --  1.7  ? ? ?Liver Function Tests: ?Recent Labs  ?Lab 10/30/21 ?1046  ?AST 28  ?ALT 13  ?ALKPHOS 95  ?BILITOT 1.1  ?PROT 7.9  ?ALBUMIN 3.6  ? ?No results for input(s): LIPASE, AMYLASE in the last 168 hours. ?No results for input(s): AMMONIA in the last 168 hours. ? ?CBC: ?Recent Labs  ?Lab 10/30/21 ?1046 10/30/21 ?1054 10/31/21 ?0118 11/01/21 ?8563 11/02/21 ?0203  ?WBC 8.1  --  6.8 6.8 6.4  ?HGB 10.8* 11.6* 10.9* 10.3* 10.1*  ?HCT 33.8* 34.0* 32.8* 31.9* 31.8*  ?MCV 92.3  --  90.1 91.1 90.9  ?PLT 133*  --  138* 147* 147*  ? ? ?INR: ?Recent Labs  ?Lab 10/30/21 ?1046 10/30/21 ?1344 10/31/21 ?0118 11/01/21 ?1497 11/02/21 ?0203  ?INR 1.8* 1.8* 1.8* 2.0* 1.9*  ? ? ?Other results: ?EKG:  ? ? ?Imaging  ? ?No results found. ? ? ?Medications:   ? ? ?Scheduled Medications: ? atorvastatin  40 mg Oral Daily  ? Chlorhexidine Gluconate Cloth  6 each Topical Q0600  ? gabapentin  100 mg Oral Daily  ? gabapentin  200 mg Oral QHS  ? insulin aspart  0-5 Units Subcutaneous QHS  ? insulin aspart  0-9 Units Subcutaneous TID WC  ? midodrine  10 mg Oral 3 times per day on Mon Wed Fri  ? midodrine  2.5 mg Oral 3 times per day on Sun Tue Thu Sat  ? pantoprazole  40 mg Oral Daily  ? sodium chloride flush  3 mL Intravenous Q12H  ? Warfarin - Pharmacist Dosing Inpatient   Does not apply M6294  ? ? ?Infusions: ? sodium chloride    ? vancomycin Stopped (11/01/21 1248)  ? ? ?PRN Medications: ?sodium chloride, acetaminophen, naphazoline-glycerin, ondansetron (ZOFRAN) IV, sodium chloride flush, traMADol ? ? ?Patient Profile  ? ?61 y.o. male with history of chornic systolic CHF/ICM s/p Medtronic single chamber ICD, HM III LVAD, CAD s/p CABG, ESRD on HD. Now presenting with multiple falls and ? Seizure activity/syncopal episodes.   ? ?Assessment/Plan:   ? ?1. Falls: Weakness and multiple falls over the weekend. Suspect too much fluid off with HD and orthostasis. No sustained VT on device interrogation.  Doing better now with midodrine and higher dry weight.  ?- Increased dry weight at HD, discussed with nephrology  ?- Continue midodrine 2.5 tid on non-HD days and 10 tid on HD days.  ?2. Chronic systolic CHF:  Ischemic cardiomyopathy. Has Medtronic ICD. Low output HF, admitted and Impella 5.5 placed 10/04/20.  HM3 LVAD was placed on 10/13/20. Developed post-op renal failure requiring eventual hemodialysis.  Ramp echo earlier this month, speed increased to 5800 rpm under echo guidance with increase in flow and stable PI.  MAP stable.  Multiple PI events on VAD interrogation, no low flow alarrms. Volume looks okay on exam, managed with HD. NYHA class II.   ?- Continue midodrine 10 mg tid on HD days, 2.5 mg tid on non HD days  ?3. LVAD HM III:  VAD interrogated personally.  He is now off ASA.  ?- Continue warfarin with INR goal 2-2.5.  PharmD to dose warfarin.  ?- Treating with vancomycin for driveline infection. Bloody drainage noted on admit. Suspect trauma from recent fall. ?4. CAD s/p CABG 2002:  Last cath in 6/19 with patent grafts. No chest pain.  ?- Continue Crestor.  ?5. ESRD: Currently dialyzing via tunneled catheter. No low flow events.  ?- Continue HD M-W-F. Nephrology following  ?- With continuous flow LVAD, think AV fistula would be unlikely to mature.   ?6. Atrial flutter/fibrillation: s/p DC-CV 08/21/18.   ?- on coumadin  ?7. 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  ?- Continue gabapentin ?8. DM: SSI  ?9. Anemia: Hgb stable on recent labs.   ?10. Suspected Gastroparesis. Nausea improved on Reglan.  ?11. Left Metatarsal Fractures: X-rays showed left 5th and possibly 4th MT base fxs. Ortho consulted: conservative management ?- plan WBAT in CAM boot ?- will f/u w/  ortho in 2 wks  ?12. Possible driveline infection: Wound culture negative. Has been on vancomycin, transition to doxycycline at discharge to complete 2 wks treatment.  ?13. Deconditioning: Think he can go to CIR today.  ? ?I reviewed the LVAD parameters from today, and compared the results to the patient's prior recorded data.  No programming changes were made.  The LVAD is functioning within specified parameters.  The patient performs LVAD self-test daily.  LVAD interrogation was negative for any  significant power changes, alarms or PI events/speed drops.  LVAD equipment check completed and is in good working order.  Back-up equipment present.   LVAD education done on emergency procedures and precautions and reviewed exit site care. ? ?Length of Stay: 3 ? ?Loralie Champagne, MD ?11/02/2021, 8:11 AM ? ?VAD Team --- VAD ISSUES ONLY--- ?Pager [phone] (7am - 7am) ? ?Advanced Heart Failure Team  ?Pager [phone] (M-F; 7a - 5p)  ?Please contact [facility] for night-coverage after hours (5p -7a ) and weekends on [email or web address] ? ?

## 2021-11-02 NOTE — Progress Notes (Signed)
LVAD Coordinator Rounding Note: ? ?HM III LVAD implanted on 10/13/20 by Dr Cyndia Bent under Destination Therapy criteria. ? ?Pt admitted from ED to Dr. Claris Gladden service due to multiple falls and syncope. ? ?Patient had dialysis yesterday. Pt tells me today that he had felt "really bad" afterwards and almost "had another episode." Pt tells me that he does feel better this morning. ? ?Dr Joelyn Oms in the room, he explained to the pt they are letting his dry weight drift up a bit. Pt vented frustrations to Dr Joelyn Oms about feeling like "no one listens to me at the dialysis center." Dr Joelyn Oms explained to the pt if the VAD team reaches out to him he can help. Dr Joelyn Oms said for the VAD team to secure chat or epic msg him any issues with Mr. Wolfson.  ? ?Patient says he is concerned about 3 flights of stairs at home with the fractures in his left foot. He has minimal weight bearing even with boot in place. He reports Dr. Aundra Dubin ordered consult for IP rehab this admission. Pt and family discussed options of alternative places to stay (on one level) and have been unable to come up with any. Third story home is only option for patient if discharged home (no elevator). If IP rehab not an option, skilled nursing may be next option. CIR is awaiting insurance approval. ? ?Vital signs: ?Temp: 98 ?HR: 70 ?Doppler:  106 ?Auto cuff:  97/73 (82) ?O2 Sat: 100% on RA ?Wt: 156.5>155.8>160.5>168.6>160.9 lbs   ? ?LVAD interrogation reveals:  ?Speed: 5800 ?Flow: 5.2 ?Power: 4.7w ?PI: 3.4 ?Hematocrit: 32 ? ?Alarms: none ?Events: 30+ for today  ? ?Fixed speed: 5800 ?Low speed limit: 5500 ? ?Drive Line: Existing VAD dressing removed and site care performed using sterile technique. Drive line exit site cleaned with Chlora prep applicators x 2, allowed to dry, and gauze dressing applied. Exit site healed and incorporated, the velour is fully implanted at exit site. Pt has scant amount of bloody drainage. Driveline appears to have repeated trauma. Pt  tells me that since he hurt his foot sleeping is difficult and he believes he may be tugging on it during night and sleeping on the line on his left side. VAD coordinator suggested placing a pillow under his left side so that he does a half turn, this may prevent the pt from laying directly on the line. No redness, tenderness , foul odor or rash noted. Drive line anchor re-applied. Next dressing change will be due 11/06/21. Continue twice weekly dressing changes.  ? ? ? ? ?Labs:  ? ?LDH trend: 177>173>189>171 ? ?INR trend: 1.8>1.8>2.0>1.9 ? ?Anticoagulation Plan: ?-INR Goal: 2.0-2.5 ?-ASA Dose: none ? ?Device:  ?-  Medtronic single ?-  Pacing: VVI 40 ?- Therapies: on 200 bpm ? ?Plan/Recommendations:  ?1. Call VAD coordinator for equipment or drive line issues ?2. Twice weekly dressing changes. Next dressing due 11/06/20. VAD coordinator will evaluate for possible advancement back to weekly. ? ?Tanda Rockers, RN ?VAD Coordinator  ?Office: [phone]  ?24/7 Pager: [phone]  ?  ? ? ? ? ? ? ? ?

## 2021-11-02 NOTE — Progress Notes (Addendum)
Inpatient Rehabilitation Admissions Coordinator  ? ?I await [employer] approval for CIR admit. Determination is pending. I met with patient and his brother, [name], at bedside. We reviewed cost of care benefits letter. They are in agreement. ? ?Danne Baxter, RN, MSN ?Rehab Admissions Coordinator ?([phone] ?11/02/2021 8:28 AM ? ?

## 2021-11-02 NOTE — Progress Notes (Signed)
Occupational Therapy Treatment ?Patient Details ?Name: Marcus Johnson. ?MRN: [id number] ?DOB: 09/24/1960 ?Today's Date: 11/02/2021 ? ? ?History of present illness Pt is a 61 y.o. M who presents 10/30/2021 with multiple falls; thought to be possible seizure activity vs syncopal episodes. Found to have left MT fxs with left 5th and possible 4th MT base fxs. CT head negative for acute abnoramlity. Significant PMH: chronic systolic CHF/ICM s/p Medtronic single chamber ICD, HM III LVAD, CAD s/p CABG, ESRD on HD. ?  ?OT comments ? Pt met and very pleasant and excited to work with therapy today. He just recently completed session with mobility tech so session focused on LB dressing, sit<>stand transfers and static standing balance, education on AE tools. Pt reports concern about bending and pinching his driveline to access his LLE at this time he is unable to perform figure 4 method due to weight of CAM boot. Verbally educated in AE for LB. Pt reports that he does have this equipment - just his family assists him instead of using it. Assessed his BUE ability to grasp and manipulate items like grabbers/reachers and other AE - RUE does have BASELINE deficits in grasp and fine motor coordination, and L hand is non-dominant. Pt able to perform sit<>stand transfers with min A for slight boost and balance. Pt and family also provided with gait belt for safety. At this time Pt requires AIR level rehab to maximize safety and independence in ADL.   ? ?Recommendations for follow up therapy are one component of a multi-disciplinary discharge planning process, led by the attending physician.  Recommendations may be updated based on patient status, additional functional criteria and insurance authorization. ?   ?Follow Up Recommendations ? Acute inpatient rehab (3hours/day)  ?  ?Assistance Recommended at Discharge Intermittent Supervision/Assistance  ?Patient can return home with the following ? A little help with walking and/or  transfers;A lot of help with bathing/dressing/bathroom;Assistance with cooking/housework;Assist for transportation;Help with stairs or ramp for entrance ?  ?Equipment Recommendations ? Other (comment) (defer to next venue of care)  ?  ?Recommendations for Other Services Rehab consult;PT consult ? ?  ?Precautions / Restrictions Precautions ?Precautions: Fall ?Precaution Comments: LVAD, monitor BP ?Required Braces or Orthoses: Other Brace ?Other Brace: L CAM boot ?Restrictions ?Weight Bearing Restrictions: Yes ?LLE Weight Bearing: Weight bearing as tolerated  ? ? ?  ? ?Mobility Bed Mobility ?  ?  ?  ?  ?  ?  ?  ?General bed mobility comments: OOB in recliner from walking with mobility tech ?  ? ?Transfers ?Overall transfer level: Needs assistance ?Equipment used: 1 person hand held assist ?Transfers: Sit to/from Stand ?Sit to Stand: Min assist ?  ?  ?  ?  ?  ?General transfer comment: minA to power up to stand and steady. ?  ?  ?Balance Overall balance assessment: Needs assistance ?Sitting-balance support: No upper extremity supported, Feet supported ?Sitting balance-Leahy Scale: Good ?  ?  ?Standing balance support: Single extremity supported ?Standing balance-Leahy Scale: Poor ?Standing balance comment: HHA, able to static stand without physical assist ?  ?  ?  ?  ?  ?  ?  ?  ?  ?  ?  ?   ? ?ADL either performed or assessed with clinical judgement  ? ?ADL Overall ADL's : Needs assistance/impaired ?Eating/Feeding: Set up;Sitting ?Eating/Feeding Details (indicate cue type and reason): in recliner, set up including opening containers ?  ?  ?  ?  ?  ?  ?  ?  ?  Lower Body Dressing: Maximal assistance ?Lower Body Dressing Details (indicate cue type and reason): concerned about driveline pinching when he bends over to access to LLE. unable to perform figure 4 due to heavyness of boot. ?Toilet Transfer: Minimal assistance;Ambulation ?Toilet Transfer Details (indicate cue type and reason): see transfer section for details ?   ?  ?  ?  ?Functional mobility during ADLs: Minimal assistance ?  ?  ? ?Extremity/Trunk Assessment Upper Extremity Assessment ?Upper Extremity Assessment: RUE deficits/detail ?RUE Deficits / Details: baseline deficits in L hand, strength generally 4/5, decreased grasp and fine motor (baseline) ?RUE Sensation: history of peripheral neuropathy ?RUE Coordination: decreased fine motor (baseline) ?  ?Lower Extremity Assessment ?Lower Extremity Assessment: LLE deficits/detail ?LLE Deficits / Details: in CAM boot ?  ?  ?  ? ?Vision   ?  ?  ?Perception   ?  ?Praxis   ?  ? ?Cognition Arousal/Alertness: Awake/alert ?Behavior During Therapy: [facility] for tasks assessed/performed ?Overall Cognitive Status: Within Functional Limits for tasks assessed ?  ?  ?  ?  ?  ?  ?  ?  ?  ?  ?  ?  ?  ?  ?  ?  ?General Comments: easily distractable, but the most pleasant and cooperative patient ?  ?  ?   ?Exercises   ? ?  ?Shoulder Instructions   ? ? ?  ?General Comments ted hose on RLE, BP stable today  ? ? ?Pertinent Vitals/ Pain       Pain Assessment ?Pain Assessment: Faces ?Faces Pain Scale: Hurts little more ?Pain Location: L foot in WB ?Pain Descriptors / Indicators: Discomfort, Grimacing ?Pain Intervention(s): Limited activity within patient's tolerance, Monitored during session ? ?Home Living   ?  ?  ?  ?  ?  ?  ?  ?  ?  ?  ?  ?  ?  ?  ?  ?  ?  ?  ? ?  ?Prior Functioning/Environment    ?  ?  ?  ?   ? ?Frequency ? Min 2X/week  ? ? ? ? ?  ?Progress Toward Goals ? ?OT Goals(current goals can now be found in the care plan section) ? Progress towards OT goals: Progressing toward goals ? ?Acute Rehab OT Goals ?Patient Stated Goal: get strong enough to do 3 flights of stairs ?OT Goal Formulation: With patient/family ?Time For Goal Achievement: 11/14/21 ?Potential to Achieve Goals: Good  ?Plan Discharge plan remains appropriate;Frequency remains appropriate   ? ?Co-evaluation ? ? ?   ?  ?  ?  ?  ? ?  ?AM-PAC OT "6 Clicks" Daily Activity      ?Outcome Measure ? ? Help from another person eating meals?: None ?Help from another person taking care of personal grooming?: A Little ?Help from another person toileting, which includes using toliet, bedpan, or urinal?: A Lot ?Help from another person bathing (including washing, rinsing, drying)?: A Little ?Help from another person to put on and taking off regular upper body clothing?: A Little ?Help from another person to put on and taking off regular lower body clothing?: A Lot ?6 Click Score: 17 ? ?  ?End of Session Equipment Utilized During Treatment: Gait belt;Other (comment) (L CAM boot) ? ?OT Visit Diagnosis: Unsteadiness on feet (R26.81);Repeated falls (R29.6);History of falling (Z91.81);Muscle weakness (generalized) (M62.81);Pain ?Pain - Right/Left: Left ?Pain - part of body: Ankle and joints of foot ?  ?Activity Tolerance Patient tolerated treatment well ?  ?Patient Left in chair;with  call bell/phone within reach;with chair alarm set;with family/visitor present ?  ?Nurse Communication Mobility status;Precautions ?  ? ?   ? ?Time: 1275-1700 ?OT Time Calculation (min): 26 min ? ?Charges: OT General Charges ?$OT Visit: 1 Visit ?OT Treatments ?$Self Care/Home Management : 8-22 mins ?$Therapeutic Activity: 8-22 mins ? ?Jesse Sans OTR/L ?Acute Rehabilitation Services ?Pager: [phone] ?Office: [phone] ? ?Merri Ray Prospero Mahnke ?11/02/2021, 1:20 PM ?

## 2021-11-02 NOTE — Progress Notes (Signed)
Mobility Specialist: Progress Note ? ? 11/02/21 1057  ?Mobility  ?Activity Ambulated with assistance in room  ?Level of Assistance Minimal assist, patient does 75% or more  ?Assistive Device Other (Comment) ?(HHA)  ?LLE Weight Bearing WBAT  ?Distance Ambulated (ft) 72 ft  ?Activity Response Tolerated well  ?$Mobility charge 1 Mobility  ? ?Pre-Mobility: 86 HR, 100% SpO2 ?Post-Mobility: 90 HR, 95% SpO2 ? ?Pt received in bed and agreeable to ambulation. Pt required minA to sit EOB and contact guard during ambulation. LOB x1 with anterior lean requiring minA to correct. No c/o throughout session. Pt to recliner after session with call bell and phone in reach.  ? ?Harrell Gave Kindsey Eblin ?Mobility Specialist ?Mobility Specialist [place]: [phone] ?Mobility Specialist [place]: [phone] ? ?

## 2021-11-02 NOTE — Progress Notes (Signed)
Admit: 10/30/2021 ?LOS: 3 ? ?35M ESRD on HD [facility] MWF, LVAD; s/p fall at home and s/p L metatarsal fracture ? ?Subjective:  ?HD yesterday, No UF, post weight unclear, 73kg this AM ?Feels better, noted sig dec in post HD malaise ?Less PI events on LVAD ?Brother at bedside ?Maybe to CIR ?OOB to chair this AM ? ?03/29 0701 - 03/30 0700 ?In: 118 [P.O.:118] ?Out: 0  ? ?Filed Weights  ? 11/01/21 0810 11/01/21 1151 11/02/21 0452  ?Weight: 76.5 kg 76.5 kg 73 kg  ? ? ?Scheduled Meds: ? atorvastatin  40 mg Oral Daily  ? Chlorhexidine Gluconate Cloth  6 each Topical Q0600  ? gabapentin  100 mg Oral Daily  ? gabapentin  200 mg Oral QHS  ? insulin aspart  0-5 Units Subcutaneous QHS  ? insulin aspart  0-9 Units Subcutaneous TID WC  ? midodrine  10 mg Oral 3 times per day on Mon Wed Fri  ? midodrine  2.5 mg Oral 3 times per day on Sun Tue Thu Sat  ? pantoprazole  40 mg Oral Daily  ? sodium chloride flush  3 mL Intravenous Q12H  ? warfarin  6 mg Oral ONCE-1600  ? Warfarin - Pharmacist Dosing Inpatient   Does not apply G3151  ? ?Continuous Infusions: ? sodium chloride    ? magnesium sulfate bolus IVPB    ? vancomycin Stopped (11/01/21 1248)  ? ?PRN Meds:.sodium chloride, acetaminophen, naphazoline-glycerin, ondansetron (ZOFRAN) IV, sodium chloride flush, traMADol ? ?Current Labs: reviewed  ? ? ?Physical Exam:  Blood pressure 110/89, pulse 86, temperature 98 ?F (36.7 ?C), temperature source Oral, resp. rate 17, height '5\' 10"'$  (1.778 m), weight 73 kg, SpO2 100 %. ?[place] NAD in bed ?Pleasant positive affect ?Cont hum on chest exam ?Nl WOB ?R IJ TDC c/d/I ?No sig LEE ?Nonfocal CN2-12 intact ? ?Dialysis Orders: Center: [facility] MWF ?4 hrs 180NRe 400/Autoflow 1.5 71 kg 2.0K/2.0 Ca TDC ?-No heparin ?-Mircera 30 mcg IV q 2 weeks (last dose 10/25/2021) ?-Venofer 50 mg IV weekly ?-No VDRA ? ?A ?ESRD MWF [facility] T[facility] ?Syncope post HD / malaise post HD; needs EDW increased ?HFrEFS/p LVAD, on vanc for ? Driveline infection ?S/p L metatarsal fractures, ortho  following, PT/OT; CIR? ?Anemia, hb stable, not due for ESA ?CMD-BMD: not on binders, Ca ok, CTM ?DM2 ? ?P ?Set 73kg as new EDW ?HD tomorrow, UF to 73kg EDW, not to exceed 2L, 2K, TDC, 400/800, no heparin ?PT/OT, ? CIR ?Medication Issues; ?Preferred narcotic agents for pain control are hydromorphone, fentanyl, and methadone. Morphine should not be used.  ?Baclofen should be avoided ?Avoid oral sodium phosphate and magnesium citrate based laxatives / bowel preps  ? ? ?Pearson Grippe MD ?11/02/2021, 11:37 AM ? ?Recent Labs  ?Lab 10/31/21 ?0118 11/01/21 ?7616 11/02/21 ?0203  ?NA 138 136 137  ?K 3.1* 4.0 4.0  ?CL 99 99 100  ?CO2 '28 24 26  '$ ?GLUCOSE 129* 93 79  ?BUN 25* 44* 23*  ?CREATININE 3.89* 5.23* 3.45*  ?CALCIUM 8.9 9.3 9.4  ? ? ?Recent Labs  ?Lab 10/31/21 ?0118 11/01/21 ?0737 11/02/21 ?0203  ?WBC 6.8 6.8 6.4  ?HGB 10.9* 10.3* 10.1*  ?HCT 32.8* 31.9* 31.8*  ?MCV 90.1 91.1 90.9  ?PLT 138* 147* 147*  ? ? ? ? ? ? ? ? ? ? ?  ?

## 2021-11-02 NOTE — Discharge Summary (Addendum)
Advanced Heart Failure Team ? ?Discharge Summary  ? ?Patient ID: [name]. ?MRN: [id number], DOB/AGE: [birth year] 61 y.o. Admit date: 10/30/2021 ?D/C date:     11/08/2021  ? ?Primary Discharge Diagnoses:  ?Falls ?Chronic systolic CHF ?Ischemic cardiomyopathy ?HM III LVAD ?CAD ?ESRD on HD ?Atrial flutter/fibrillation ?DM II ?Anemia ?Left foot metatarsal fractures ?Driveline infection ?Deconditioning ? ?Consults: ?Orthopedics ?Nephrology ?PT/OT ?CIR ? ?Hospital Course:  ?Emmert Roethler Jamion Carter. is a 61 y.o. male who has a h/o DM2, CAD s/p CABG 1017, systolic HF due to ischemic cardiomyopathy with EF 20-25% (echo 12/15), DM2 and CKD. He is s/p Medtronic single chamber ICD. ?  ?He was admitted in 6/19 with marked volume overload and dyspnea.  Echo EF 20% with severe global hypokinesis.  LHC/RHC showed severe native vessel CAD with patent LIMA-D, SVG-OM, and SVG-PLV; cardiac index low.  He was started on milrinone and diuresed.  He was adamant that he did not want to pursue LVAD workup yet and did not want a referral for transplant evaluation.  ?  ?Admitted 08/01/18-08/07/18 with volume overload and left foot wound. Diuresed 38 lbs with IV lasix and then transitioned back to torsemide 100 mg BID. ID was consulted with concerns for left foot osteomyelitis noted on CT scan. He got IV ancef q8 hours x 42 days. [facility] provided teaching and supplies for home antibiotics. Course complicated by AKI. DC weight 200 lbs.  ?  ?He went into atrial flutter and had DCCV to NSR in 1/20.  ?  ?He developed COVID-19 infection in 1/22.  Symptoms were predominantly GI.   ?  ?He developed progressive worsening of CHF in early 2022 and was admitted in 3/22 with low output HF.  Impella 5.5 was placed as bridge to Heartmate III LVAD placed in 10/13/20.  He developed post-op renal failure and ended up on dialysis.    ?  ?Last seen in VAD clinic for f/u 10/12/21. Had recently completed course of doxycycline for driveline infection.  ?   ?Admitted on 10/30/21 after recurrent falls and possible syncopal episodes at home. No VT on ICD interrogation.Had noted feeling worse after dry weight recently lowered and more volume removed at HD. Had positive orthostatics. Midodrine increased. Nephrology consulted and continued HD on regular schedule while inpatient. Dry weight increased. ? ?CT head no acute findings. Xray left foot with fracture left 5th and possibly 4th metatarsal. Ortho consulted. WBAT and CAM boot recommended. Outpatient f/u recommended.  ?  ?Treated with vancomycin for possible driveline infection. Wound culture negative. At discharge, he was transition to doxycycline, to complete 2 weeks treatment. ? ?Insurance denied CIR. Evaluated by PT/OT again on 4/4 and home health PT was recommended, however pt refused home health services. He will have family support to help w/ transition to home.  ? ?On 4/5, he was last seen and examined by Dr. Aundra Dubin and felt stable for d/c home. D/w ortho, he will f/u w/ Dr. Roma Kayser at [facility]. VAD clinic f/u for post hospital.  ? ? ?Hospital Course by Problem: ? ?1. Falls: Weakness and multiple falls prior to admission. Suspect too much fluid off with HD and orthostasis. No sustained VT on device interrogation.  Doing better now with higher dry weight.  ?- Continue higher EDW per nephrology.  ?- Continue midodrine 10 tid on HD days, have stopped non-HD midodrine with higher MAP.  ?2. Chronic systolic CHF:  Ischemic cardiomyopathy. Has Medtronic ICD. Low output HF, admitted and Impella 5.5 placed 10/04/20.  HM3 LVAD was placed on 10/13/20. Developed post-op renal failure requiring eventual hemodialysis.  MAP now elevated.  For now, will accept elevated MAP on non-HD days.  LDH stable.   ?- NYHA II. Volume looks good on exam, managed with HD. ?- Continue midodrine 10 mg tid on HD days, stopped midodrine on non-HD days.   ?3. LVAD HM III:  VAD interrogated personally.  He is now off ASA.  ?- Continue warfarin  with INR goal 2-2.5.  PharmD to dose warfarin. INR 2.1. ?- Treating with vancomycin for driveline infection. Bloody drainage noted on admit. Suspect trauma from recent fall. ?4. CAD s/p CABG 2002:  Last cath in 6/19 with patent grafts. No chest pain.  ?- Continue Crestor.  ?5. ESRD: Currently dialyzing via tunneled catheter. No low flow events.  ?- Continue HD M-W-F. Nephrology following  ?- With continuous flow LVAD, think AV fistula would be unlikely to mature.   ?6. Atrial flutter/fibrillation: s/p DC-CV 08/21/18.   ?- on coumadin ?7. 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  ?- Continue gabapentin ?8. DM: SSI .He was running low so home lantus stopped. ?9. Anemia: Hgb stable, 9.4 today. ?10. Suspected Gastroparesis. Nausea improved on Reglan.  ?11. Left Metatarsal Fractures: X-rays showed left 5th and possibly 4th MT base fxs. Ortho consulted: conservative management ?- plan WBAT in CAM boot ?- will f/u w/ ortho in 2 wks  ?12. Possible driveline infection: Wound culture negative. Has been on vancomycin, transition to doxycycline at discharge to complete 2 wks treatment.  ?13. Deconditioning: Insurance denied CIR. PT now recommending HH PT  ?14. Hypomagnesemia: Resolved w/ supp. Mg 2.1 today  ? ? ?Discharge Weight Range: Lowest 155 lb>>>up to 166 lb ?Discharge Vitals: Blood pressure (!) 133/101, pulse 90, temperature 98.4 ?F (36.9 ?C), temperature source Oral, resp. rate 19, height '5\' 10"'$  (1.778 m), weight 72 kg, SpO2 100 %. ? ?Labs: ?Lab Results  ?Component Value Date  ? WBC 7.7 11/08/2021  ? HGB 9.4 (L) 11/08/2021  ? HCT 28.0 (L) 11/08/2021  ? MCV 90.0 11/08/2021  ? PLT 157 11/08/2021  ?  ?Recent Labs  ?Lab 11/08/21 ?3825  ?NA 135  ?K 4.2  ?CL 101  ?CO2 26  ?BUN 42*  ?CREATININE 4.10*  ?CALCIUM 9.5  ?GLUCOSE 96  ? ?Lab Results  ?Component Value Date  ? CHOL 143 10/31/2021  ? HDL 33 (L) 10/31/2021  ? [place] 82 10/31/2021  ? TRIG 139  10/31/2021  ? ?BNP (last 3 results) ?Recent Labs  ?  11/24/20 ?0539 12/01/20 ?0000 12/08/20 ?0354  ?BNP 723.8* 740.4* 777.5*  ? ? ?ProBNP (last 3 results) ?No results for input(s): PROBNP in the last 8760 hours. ? ? ?Diagnostic Studies/Procedures  ? ?CT head/ Cspine ?IMPRESSION: ?CT head: ?  ?1. No evidence of acute intracranial abnormality. ?2. Mild-to-moderate chronic small vessel ischemic changes within the ?cerebral white matter. ?3. Mild generalized parenchymal atrophy. ?4. Paranasal sinus disease at the imaged levels, as described. ?  ?CT cervical spine: ?  ?1. No evidence of acute fracture to the cervical spine. ?2. Nonspecific straightening of the expected cervical lordosis. ?3. Cervical spondylosis, as described. ?  ?Xray left foot ?IMPRESSION: ?Mildly displaced fractures at the base and head of the fifth ?metatarsal. ?  ?Question associated injury at the base of the fourth metatarsal. ?  ?Correlate with point tenderness over the midfoot medially and ?consider weighted views of the foot if there is clinical suspicion ?for  more extensive midfoot/Lisfranc injury. ?  ?Degenerative changes about the midfoot and first MTP joint. ? ? ?Discharge Medications  ? ?Allergies as of 11/08/2021   ? ?   Reactions  ? Meclizine Hcl Anaphylaxis, Swelling  ? Ivabradine Nausea Only  ? ?  ? ?  ?Medication List  ?  ? ?STOP taking these medications   ? ?ascorbic acid 1000 MG tablet ?Commonly known as: VITAMIN C ?  ?insulin glargine 100 UNIT/ML injection ?Commonly known as: LANTUS ?  ?multivitamin Tabs tablet ?  ?polyethylene glycol powder 17 GM/SCOOP powder ?Commonly known as: GLYCOLAX/MIRALAX ?  ?rosuvastatin 10 MG tablet ?Commonly known as: CRESTOR ?  ? ?  ? ?TAKE these medications   ? ?atorvastatin 40 MG tablet ?Commonly known as: LIPITOR ?Take 1 tablet (40 mg total) by mouth daily. ?  ?Chlorhexidine Gluconate Cloth 2 % Pads ?Apply 6 each topically daily at 6 (six) AM. ?  ?doxycycline 100 MG tablet ?Commonly known as:  VIBRA-TABS ?Take 1 tablet (100 mg total) by mouth 2 (two) times daily. ?  ?gabapentin 100 MG capsule ?Commonly known as: NEURONTIN ?Take one tablet as needed ?What changed:  ?how much to take ?how to take this ?when to take

## 2021-11-02 NOTE — Plan of Care (Signed)
?  Problem: Education: ?Goal: Knowledge of General Education information will improve ?Description: Including pain rating scale, medication(s)/side effects and non-pharmacologic comfort measures ?Outcome: Progressing ?  ?Problem: Health Behavior/Discharge Planning: ?Goal: Ability to manage health-related needs will improve ?Outcome: Progressing ?  ?Problem: Clinical Measurements: ?Goal: Ability to maintain clinical measurements within normal limits will improve ?Outcome: Progressing ?Goal: Diagnostic test results will improve ?Outcome: Progressing ?  ?Problem: Activity: ?Goal: Risk for activity intolerance will decrease ?Outcome: Progressing ?  ?Problem: Nutrition: ?Goal: Adequate nutrition will be maintained ?Outcome: Progressing ?  ?Problem: Coping: ?Goal: Level of anxiety will decrease ?Outcome: Progressing ?  ?Problem: Elimination: ?Goal: Will not experience complications related to urinary retention ?Outcome: Progressing ?  ?Problem: Coping: ?Goal: Level of anxiety will decrease ?Outcome: Progressing ?  ?Problem: Elimination: ?Goal: Will not experience complications related to urinary retention ?Outcome: Progressing ?  ?Problem: Pain Managment: ?Goal: General experience of comfort will improve ?Outcome: Progressing ?  ?

## 2021-11-02 NOTE — Progress Notes (Signed)
ANTICOAGULATION CONSULT NOTE - Initial Consult ? ?Pharmacy Consult for warfarin ?Indication:  LVAD HM3 ? ?Allergies  ?Allergen Reactions  ? Meclizine Hcl Anaphylaxis and Swelling  ? Ivabradine Nausea Only  ? ? ?Patient Measurements: ?Height: '5\' 10"'$  (177.8 cm) ?Weight: 73 kg (160 lb 15 oz) ?IBW/kg (Calculated) : 73 ?Heparin Dosing Weight: 71.7 kg ? ?Vital Signs: ?Temp: 98 ?F (36.7 ?C) (03/30 0308) ?Temp Source: Oral (03/30 0617) ?BP: 110/89 (03/30 0617) ?Pulse Rate: 86 (03/30 0617) ? ?Labs: ?Recent Labs  ?  10/30/21 ?1344 10/30/21 ?1344 10/31/21 ?0118 11/01/21 ?4097 11/02/21 ?0203  ?HGB  --    < > 10.9* 10.3* 10.1*  ?HCT  --   --  32.8* 31.9* 31.8*  ?PLT  --   --  138* 147* 147*  ?LABPROT 21.1*  --  20.8* 22.9* 21.5*  ?INR 1.8*  --  1.8* 2.0* 1.9*  ?CREATININE  --   --  3.89* 5.23* 3.45*  ?TROPONINIHS 1,274*  --   --   --   --   ? < > = values in this interval not displayed.  ? ? ? ?Estimated Creatinine Clearance: 23.5 mL/min (A) (by C-G formula based on SCr of 3.45 mg/dL (H)). ? ? ?Medical History: ?Past Medical History:  ?Diagnosis Date  ? AICD (automatic cardioverter/defibrillator) present   ? Single-chamber  implantable cardiac defibrillator - Medtronic  ? Atrial fibrillation ([place])   ? Cataract   ? Mixed form OU  ? CHF (congestive heart failure) ([place])   ? Chronic kidney disease   ? Chronic kidney disease (CKD), stage III (moderate) (HCC)   ? Chronic systolic heart failure ([place])   ? a. Echo 5/13: Mild LVE, mild LVH, EF 10%, anteroseptal, lateral, apical AK, mild MR, mild LAE, moderately reduced RVSF, mild RAE, PASP 60;  b. 07/2014 Echo: EF 20-2%, diff HK, AKI of antsep/apical/mid-apicalinferior, mod reduced RV.  ? Coronary artery disease   ? a. s/p CABG 2002 b. LHC 5/13:  dLM 80%, LAD subtotally occluded, pCFX occluded, pRCA 50%, mid? Occlusion with high take off of the PDA with 70% multiple lesions-not bypassed and supplies collaterals to LAD, LIMA-IM/ramus ok, S-OM ok, S-PLA branches ok. Medical therapy was  recommended  ? COVID   ? Diabetic retinopathy ([place])   ? NPDR OD, PDR OS  ? Dyspnea   ? Gout   ? "on daily RX" (01/08/2018)  ? Hypertension   ? Hypertensive retinopathy   ? OU  ? Ischemic cardiomyopathy   ? a. Echo 5/13: Mild LVE, mild LVH, EF 10%, anteroseptal, lateral, apical AK, mild MR, mild LAE, moderately reduced RVSF, mild RAE, PASP 60;  b. 01/2012 s/p MDT [id number] Protecta XT VR AICD;  c. 07/2014 Echo: EF 20-2%, diff HK, AKI of antsep/apical/mid-apicalinferior, mod reduced RV.  ? MRSA (methicillin resistant Staphylococcus aureus)   ? Status post right foot plantar deep infection with MRSA status post  I&D 10/2008  ? Myocardial infarction [facility])   ? "was told I'd had several before heart OR in 2002" (01/08/2018)  ? Noncompliance   ? Peripheral neuropathy   ? Retinopathy, diabetic, background ([place])   ? Syncope   ? Type II diabetes mellitus ([place])   ? requiring insulin   ? Vitreous hemorrhage, left (HCC)   ? and proliferative diabetic retinopathy  ? ? ?Medications:  ?Scheduled:  ? atorvastatin  40 mg Oral Daily  ? Chlorhexidine Gluconate Cloth  6 each Topical Q0600  ? gabapentin  100 mg Oral Daily  ? gabapentin  200 mg Oral QHS  ? insulin aspart  0-5 Units Subcutaneous QHS  ? insulin aspart  0-9 Units Subcutaneous TID WC  ? midodrine  10 mg Oral 3 times per day on Mon Wed Fri  ? midodrine  2.5 mg Oral 3 times per day on Sun Tue Thu Sat  ? pantoprazole  40 mg Oral Daily  ? sodium chloride flush  3 mL Intravenous Q12H  ? Warfarin - Pharmacist Dosing Inpatient   Does not apply [id number]  ? ? ?Assessment: ?61 yom presenting after fall, head CT negative. Was found to have mild displaced fractures at 5h metatarsal. On warfarin PTA for hx LVAD HM3 - LD 3/26'@1900'$ . ? ?INR subtherapeutic today at 1.9. Warfarin dose of 5 mg ordered for 3/27, but not given likely effecting INR today. Hgb stable at 10.1 and plt stable in the 130-140s. LDH stable in the 170-180s. No s/sx of bleeding. Will give higher dose tonight given missed dose on  3/27. ? ?PTA regimen 4.5 mg daily except 6 mg on Tuesday. ? ?Goal of Therapy:  ?INR 2-2.5 ?Monitor platelets by anticoagulation protocol: Yes ?  ?Plan:  ?Warfarin 6 mg tonight ?Monitor daily INR, CBC, and for s/sx of bleeding  ? ?Cathrine Muster, PharmD ?PGY2 Cardiology Pharmacy Resident ?Phone: [phone] ?11/02/2021  11:16 AM ? ?Please check [email or web address] for unit-specific pharmacy phone numbers. ? ?Please check AMION for all [place] phone numbers ?After 10:00 PM, call [place] [phone] ? ? ? ?

## 2021-11-03 DIAGNOSIS — N186 End stage renal disease: Secondary | ICD-10-CM | POA: Diagnosis not present

## 2021-11-03 DIAGNOSIS — Z992 Dependence on renal dialysis: Secondary | ICD-10-CM | POA: Diagnosis not present

## 2021-11-03 DIAGNOSIS — I509 Heart failure, unspecified: Secondary | ICD-10-CM | POA: Diagnosis not present

## 2021-11-03 LAB — PROTIME-INR
INR: 2.1 — ABNORMAL HIGH (ref 0.8–1.2)
Prothrombin Time: 23.9 seconds — ABNORMAL HIGH (ref 11.4–15.2)

## 2021-11-03 LAB — BASIC METABOLIC PANEL
Anion gap: 10 (ref 5–15)
BUN: 38 mg/dL — ABNORMAL HIGH (ref 6–20)
CO2: 26 mmol/L (ref 22–32)
Calcium: 9.2 mg/dL (ref 8.9–10.3)
Chloride: 102 mmol/L (ref 98–111)
Creatinine, Ser: 4.62 mg/dL — ABNORMAL HIGH (ref 0.61–1.24)
GFR, Estimated: 14 mL/min — ABNORMAL LOW (ref >60)
Glucose, Bld: 118 mg/dL — ABNORMAL HIGH (ref 70–99)
Potassium: 4.2 mmol/L (ref 3.5–5.1)
Sodium: 138 mmol/L (ref 135–145)

## 2021-11-03 LAB — CBC
HCT: 30.3 % — ABNORMAL LOW (ref 39.0–52.0)
Hemoglobin: 10.2 g/dL — ABNORMAL LOW (ref 13.0–17.0)
MCH: 30.1 pg (ref 26.0–34.0)
MCHC: 33.7 g/dL (ref 30.0–36.0)
MCV: 89.4 fL (ref 80.0–100.0)
Platelets: 150 10*3/uL (ref 150–400)
RBC: 3.39 MIL/uL — ABNORMAL LOW (ref 4.22–5.81)
RDW: 13.1 % (ref 11.5–15.5)
WBC: 6.8 10*3/uL (ref 4.0–10.5)
nRBC: 0 % (ref 0.0–0.2)

## 2021-11-03 LAB — GLUCOSE, CAPILLARY
Glucose-Capillary: 89 mg/dL (ref 70–99)
Glucose-Capillary: 76 mg/dL (ref 70–99)
Glucose-Capillary: 141 mg/dL — ABNORMAL HIGH (ref 70–99)
Glucose-Capillary: 124 mg/dL — ABNORMAL HIGH (ref 70–99)

## 2021-11-03 LAB — MAGNESIUM: Magnesium: 2.3 mg/dL (ref 1.7–2.4)

## 2021-11-03 LAB — VANCOMYCIN, RANDOM: Vancomycin Rm: 15

## 2021-11-03 LAB — LACTATE DEHYDROGENASE: LDH: 164 U/L (ref 98–192)

## 2021-11-03 MED ORDER — WARFARIN SODIUM 2.5 MG PO TABS
4.5000 mg | ORAL_TABLET | Freq: Every day | ORAL | Status: DC
  Administered 2021-11-03 – 2021-11-07 (×5): 4.5 mg via ORAL
  Filled 2021-11-03 (×5): qty 1

## 2021-11-03 NOTE — Progress Notes (Signed)
ANTICOAGULATION CONSULT NOTE - Initial Consult ? ?Pharmacy Consult for warfarin ?Indication:  LVAD HM3 ? ?Allergies  ?Allergen Reactions  ? Meclizine Hcl Anaphylaxis and Swelling  ? Ivabradine Nausea Only  ? ? ?Patient Measurements: ?Height: '5\' 10"'$  (177.8 cm) ?Weight: 72.5 kg (159 lb 13.3 oz) ?IBW/kg (Calculated) : 73 ?Heparin Dosing Weight: 71.7 kg ? ?Vital Signs: ?Temp: 97.7 ?F (36.5 ?C) (03/31 0720) ?Temp Source: Oral (03/31 0700) ?BP: 116/88 (03/31 0700) ?Pulse Rate: 87 (03/31 1130) ? ?Labs: ?Recent Labs  ?  11/01/21 ?1443 11/02/21 ?0203 11/03/21 ?0050  ?HGB 10.3* 10.1* 10.2*  ?HCT 31.9* 31.8* 30.3*  ?PLT 147* 147* 150  ?LABPROT 22.9* 21.5* 23.9*  ?INR 2.0* 1.9* 2.1*  ?CREATININE 5.23* 3.45* 4.62*  ? ? ? ?Estimated Creatinine Clearance: 17.4 mL/min (A) (by C-G formula based on SCr of 4.62 mg/dL (H)). ? ? ?Medical History: ?Past Medical History:  ?Diagnosis Date  ? AICD (automatic cardioverter/defibrillator) present   ? Single-chamber  implantable cardiac defibrillator - Medtronic  ? Atrial fibrillation ([place])   ? Cataract   ? Mixed form OU  ? CHF (congestive heart failure) ([place])   ? Chronic kidney disease   ? Chronic kidney disease (CKD), stage III (moderate) (HCC)   ? Chronic systolic heart failure ([place])   ? a. Echo 5/13: Mild LVE, mild LVH, EF 10%, anteroseptal, lateral, apical AK, mild MR, mild LAE, moderately reduced RVSF, mild RAE, PASP 60;  b. 07/2014 Echo: EF 20-2%, diff HK, AKI of antsep/apical/mid-apicalinferior, mod reduced RV.  ? Coronary artery disease   ? a. s/p CABG 2002 b. LHC 5/13:  dLM 80%, LAD subtotally occluded, pCFX occluded, pRCA 50%, mid? Occlusion with high take off of the PDA with 70% multiple lesions-not bypassed and supplies collaterals to LAD, LIMA-IM/ramus ok, S-OM ok, S-PLA branches ok. Medical therapy was recommended  ? COVID   ? Diabetic retinopathy ([place])   ? NPDR OD, PDR OS  ? Dyspnea   ? Gout   ? "on daily RX" (01/08/2018)  ? Hypertension   ? Hypertensive retinopathy   ? OU  ?  Ischemic cardiomyopathy   ? a. Echo 5/13: Mild LVE, mild LVH, EF 10%, anteroseptal, lateral, apical AK, mild MR, mild LAE, moderately reduced RVSF, mild RAE, PASP 60;  b. 01/2012 s/p MDT [id number] Protecta XT VR AICD;  c. 07/2014 Echo: EF 20-2%, diff HK, AKI of antsep/apical/mid-apicalinferior, mod reduced RV.  ? MRSA (methicillin resistant Staphylococcus aureus)   ? Status post right foot plantar deep infection with MRSA status post  I&D 10/2008  ? Myocardial infarction [facility])   ? "was told I'd had several before heart OR in 2002" (01/08/2018)  ? Noncompliance   ? Peripheral neuropathy   ? Retinopathy, diabetic, background ([place])   ? Syncope   ? Type II diabetes mellitus ([place])   ? requiring insulin   ? Vitreous hemorrhage, left (HCC)   ? and proliferative diabetic retinopathy  ? ? ?Medications:  ?Scheduled:  ? atorvastatin  40 mg Oral Daily  ? Chlorhexidine Gluconate Cloth  6 each Topical Q0600  ? gabapentin  100 mg Oral Daily  ? gabapentin  200 mg Oral QHS  ? insulin aspart  0-5 Units Subcutaneous QHS  ? insulin aspart  0-9 Units Subcutaneous TID WC  ? midodrine  10 mg Oral 3 times per day on Mon Wed Fri  ? pantoprazole  40 mg Oral Daily  ? sodium chloride flush  3 mL Intravenous Q12H  ? warfarin  4.5 mg Oral  q1600  ? Warfarin - Pharmacist Dosing Inpatient   Does not apply [id number]  ? ? ?Assessment: ?38 yom presenting after fall, head CT negative. Was found to have mild displaced fractures at 5h metatarsal. On warfarin PTA for hx LVAD HM3  ? ?INR therapeutic today at 2.1. Hgb stable at 10 and plt stable in the 130-140s. LDH stable in the 160-180s. No s/sx of bleeding.  ? ?PTA regimen 4.5 mg daily except 6 mg on Tuesday. ? ?Goal of Therapy:  ?INR 2-2.5 ?Monitor platelets by anticoagulation protocol: Yes ?  ?Plan:  ?Warfarin 4.'5mg'$  daily as per PTA dose ?Monitor daily INR, CBC, and for s/sx of bleeding  ? ? ?Bonnita Nasuti Pharm.D. CPP, BCPS ?Clinical Pharmacist ?[phone] ?11/03/2021 2:22 PM  ? ? ?Please check [email or web address] for  unit-specific pharmacy phone numbers. ? ?Please check AMION for all [place] phone numbers ?After 10:00 PM, call [place] [phone] ? ? ? ?

## 2021-11-03 NOTE — Progress Notes (Signed)
Inpatient Rehab Admissions Coordinator:  ?Received a denial from insurance. Informed pt and he would like to appeal decision. Will begin appeal process. Will continue to follow. ? ? ?Gayland Curry, MS, CCC-SLP ?Admissions Coordinator ?[phone] ? ?

## 2021-11-03 NOTE — Progress Notes (Signed)
Patient ID: Marcus Auld., male   DOB: 1961-02-28, 61 y.o.   MRN: [id number] ?  ?Advanced Heart Failure VAD Team Note ? ?PCP-Cardiologist: None  ? ?Subjective:   ? ?Started on empiric vancomycin for suspected DL infection. Wound culture NGTD.  Afebrile.  ? ?MAPs now 90s, denies lightheadedness.  Was able to walk short distance yesterday.  Feeling better with increased EDW at HD.  Seen at HD today.  ? ?LVAD INTERROGATION:  ?HeartMate III LVAD:   ?Flow 4.9 liters/min, speed 5800, power 4.6, PI 3.5.  \ ? ?Objective:   ? ?Vital Signs:   ?Temp:  [97.7 ?F (36.5 ?C)-98.9 ?F (37.2 ?C)] 97.7 ?F (36.5 ?C) (03/31 0720) ?Pulse Rate:  [76-87] 83 (03/31 0802) ?Resp:  [14-23] 18 (03/31 0802) ?BP: (84-116)/(71-96) 116/88 (03/31 0700) ?SpO2:  [98 %-100 %] 100 % (03/31 0700) ?Weight:  [72.5 kg] 72.5 kg (03/31 0720) ?Last BM Date : 10/30/21 ?Mean arterial Pressure  90s ? ?Intake/Output:  ? ?Intake/Output Summary (Last 24 hours) at 11/03/2021 0830 ?Last data filed at 11/03/2021 0700 ?Gross per 24 hour  ?Intake 360 ml  ?Output 250 ml  ?Net 110 ml  ?  ? ?Physical Exam  ?  ?General: Well appearing this am. NAD.  ?HEENT: Normal. ?Neck: Supple, JVP 7-8 cm. Carotids OK.  ?Cardiac:  Mechanical heart sounds with LVAD hum present.  ?Lungs:  CTAB, normal effort.  ?Abdomen:  NT, ND, no HSM. No bruits or masses. +BS  ?LVAD exit site: Well-healed and incorporated. Dressing dry and intact. No erythema or drainage. Stabilization device present and accurately applied. Driveline dressing changed daily per sterile technique. ?Extremities:  Warm and dry. No cyanosis, clubbing, rash, or edema. Left lower leg in boot.  ?Neuro:  Alert & oriented x 3. Cranial nerves grossly intact. Moves all 4 extremities w/o difficulty. Affect pleasant   ? ? ?Telemetry  ? ?NSR 80s  ? ?EKG  ?  ?No new EKG  ? ?Labs  ? ?Basic Metabolic Panel: ?Recent Labs  ?Lab 10/30/21 ?1046 10/30/21 ?1054 10/31/21 ?0118 11/01/21 ?4481 11/02/21 ?0203 11/03/21 ?0050  ?NA 140 137 138 136  137 138  ?K 4.0 3.9 3.1* 4.0 4.0 4.2  ?CL 102 103 99 99 100 102  ?CO2 24  --  '28 24 26 26  '$ ?GLUCOSE 110* 113* 129* 93 79 118*  ?BUN 68* 65* 25* 44* 23* 38*  ?CREATININE 7.70* 8.10* 3.89* 5.23* 3.45* 4.62*  ?CALCIUM 9.7  --  8.9 9.3 9.4 9.2  ?MG  --   --   --   --  1.7 2.3  ? ? ?Liver Function Tests: ?Recent Labs  ?Lab 10/30/21 ?1046  ?AST 28  ?ALT 13  ?ALKPHOS 95  ?BILITOT 1.1  ?PROT 7.9  ?ALBUMIN 3.6  ? ?No results for input(s): LIPASE, AMYLASE in the last 168 hours. ?No results for input(s): AMMONIA in the last 168 hours. ? ?CBC: ?Recent Labs  ?Lab 10/30/21 ?1046 10/30/21 ?1054 10/31/21 ?0118 11/01/21 ?8563 11/02/21 ?0203 11/03/21 ?0050  ?WBC 8.1  --  6.8 6.8 6.4 6.8  ?HGB 10.8* 11.6* 10.9* 10.3* 10.1* 10.2*  ?HCT 33.8* 34.0* 32.8* 31.9* 31.8* 30.3*  ?MCV 92.3  --  90.1 91.1 90.9 89.4  ?PLT 133*  --  138* 147* 147* 150  ? ? ?INR: ?Recent Labs  ?Lab 10/30/21 ?1344 10/31/21 ?0118 11/01/21 ?1497 11/02/21 ?0203 11/03/21 ?0050  ?INR 1.8* 1.8* 2.0* 1.9* 2.1*  ? ? ?Other results: ?EKG:  ? ? ?Imaging  ? ?No results found. ? ? ?  Medications:   ? ? ?Scheduled Medications: ? atorvastatin  40 mg Oral Daily  ? Chlorhexidine Gluconate Cloth  6 each Topical Q0600  ? gabapentin  100 mg Oral Daily  ? gabapentin  200 mg Oral QHS  ? insulin aspart  0-5 Units Subcutaneous QHS  ? insulin aspart  0-9 Units Subcutaneous TID WC  ? midodrine  10 mg Oral 3 times per day on Mon Wed Fri  ? pantoprazole  40 mg Oral Daily  ? sodium chloride flush  3 mL Intravenous Q12H  ? Warfarin - Pharmacist Dosing Inpatient   Does not apply [id number]  ? ? ?Infusions: ? sodium chloride    ? vancomycin Stopped (11/01/21 1248)  ? ? ?PRN Medications: ?sodium chloride, acetaminophen, naphazoline-glycerin, ondansetron (ZOFRAN) IV, sodium chloride flush, traMADol ? ? ?Patient Profile  ? ?61 y.o. male with history of chornic systolic CHF/ICM s/p Medtronic single chamber ICD, HM III LVAD, CAD s/p CABG, ESRD on HD. Now presenting with multiple falls and ? Seizure  activity/syncopal episodes.  ? ?Assessment/Plan:   ? ?1. Falls: Weakness and multiple falls over the weekend. Suspect too much fluid off with HD and orthostasis. No sustained VT on device interrogation.  Doing better now with higher dry weight.  ?- Continue higher EDW per nephrology.  ?- Continue midodrine 10 tid on HD days, will stop non-HD midodrine with higher MAP.  ?2. Chronic systolic CHF:  Ischemic cardiomyopathy. Has Medtronic ICD. Low output HF, admitted and Impella 5.5 placed 10/04/20.  HM3 LVAD was placed on 10/13/20. Developed post-op renal failure requiring eventual hemodialysis.  MAP now elevated.  Volume looks okay on exam, managed with HD. NYHA class II.   ?- Continue midodrine 10 mg tid on HD days, stop midodrine on non-HD days.   ?3. LVAD HM III:  VAD interrogated personally.  He is now off ASA.  ?- Continue warfarin with INR goal 2-2.5.  PharmD to dose warfarin.  ?- Treating with vancomycin for driveline infection. Bloody drainage noted on admit. Suspect trauma from recent fall. ?4. CAD s/p CABG 2002:  Last cath in 6/19 with patent grafts. No chest pain.  ?- Continue Crestor.  ?5. ESRD: Currently dialyzing via tunneled catheter. No low flow events.  ?- Continue HD M-W-F. Nephrology following  ?- With continuous flow LVAD, think AV fistula would be unlikely to mature.   ?6. Atrial flutter/fibrillation: s/p DC-CV 08/21/18.   ?- on coumadin  ?7. 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  ?- Continue gabapentin ?8. DM: SSI  ?9. Anemia: Hgb stable on recent labs.   ?10. Suspected Gastroparesis. Nausea improved on Reglan.  ?11. Left Metatarsal Fractures: X-rays showed left 5th and possibly 4th MT base fxs. Ortho consulted: conservative management ?- plan WBAT in CAM boot ?- will f/u w/ ortho in 2 wks  ?12. Possible driveline infection: Wound culture negative. Has been on vancomycin, transition to doxycycline at discharge to complete 2  wks treatment.  ?13. Deconditioning: Think he can go to CIR today (whenever approved).  ? ?I reviewed the LVAD parameters from today, and compared the results to the patient's prior recorded data.  No programming changes were made.  The LVAD is functioning within specified parameters.  The patient performs LVAD self-test daily.  LVAD interrogation was negative for any significant power changes, alarms or PI events/speed drops.  LVAD equipment check completed and is in good working order.  Back-up equipment present.   LVAD education done on  emergency procedures and precautions and reviewed exit site care. ? ?Length of Stay: 4 ? ?Loralie Champagne, MD ?11/03/2021, 8:30 AM ? ?VAD Team --- VAD ISSUES ONLY--- ?Pager [phone] (7am - 7am) ? ?Advanced Heart Failure Team  ?Pager [phone] (M-F; 7a - 5p)  ?Please contact [facility] for night-coverage after hours (5p -7a ) and weekends on [email or web address] ? ?

## 2021-11-03 NOTE — Plan of Care (Signed)

## 2021-11-03 NOTE — Progress Notes (Signed)
Physical Therapy Treatment ?Patient Details ?Name: Marcus Johnson. ?MRN: [id number] ?DOB: 1961-03-20 ?Today's Date: 11/03/2021 ? ? ?History of Present Illness Pt is a 61 y.o. M who presents 10/30/2021 with multiple falls; thought to be possible seizure activity vs syncopal episodes. Found to have left MT fxs with left 5th and possible 4th MT base fxs. CT head negative for acute abnoramlity. Significant PMH: chronic systolic CHF/ICM s/p Medtronic single chamber ICD, HM III LVAD, CAD s/p CABG, ESRD on HD. ? ?  ?PT Comments  ? ? Planned to focus session on gait training and gait distance progression. However, upon arrival pt reported feeling "weird" with BP noted to be 75/49. His BP did not improve with standing, thus due to being symptomatic with low BP deferred gait training this date for safety purposes. Instead, focused session on seated upper and lower extremity exercises for strengthening and muscular endurance. Pt displaying muscular weakness, needing minA to power up to stand today. Pt is at risk for falls and would have significant difficulty navigating the x3 flights of stairs to access his home. He has had a significant decline in functional status and would greatly benefit from intensive therapy in the AIR setting to maximize his return to baseline. Will continue to follow acutely. ?   ?Recommendations for follow up therapy are one component of a multi-disciplinary discharge planning process, led by the attending physician.  Recommendations may be updated based on patient status, additional functional criteria and insurance authorization. ? ?Follow Up Recommendations ? Acute inpatient rehab (3hours/day) ?  ?  ?Assistance Recommended at Discharge Frequent or constant Supervision/Assistance  ?Patient can return home with the following A little help with walking and/or transfers;A little help with bathing/dressing/bathroom;Assistance with cooking/housework;Assist for transportation;Help with stairs or  ramp for entrance ?  ?Equipment Recommendations ? None recommended by PT  ?  ?Recommendations for Other Services   ? ? ?  ?Precautions / Restrictions Precautions ?Precautions: Fall ?Precaution Comments: Orthostatic, LVAD ?Required Braces or Orthoses: Other Brace ?Other Brace: L CAM boot ?Restrictions ?Weight Bearing Restrictions: Yes ?LLE Weight Bearing: Weight bearing as tolerated  ?  ? ?Mobility ? Bed Mobility ?  ?  ?  ?  ?  ?  ?  ?General bed mobility comments: Pt up in chair upon arrival. ?  ? ?Transfers ?Overall transfer level: Needs assistance ?Equipment used: Rolling walker (2 wheels) ?Transfers: Sit to/from Stand ?Sit to Stand: Min assist ?  ?  ?  ?  ?  ?General transfer comment: Attempting to come to stand 2x before successful with minA under buttocks to initiate power up to stand. ?  ? ?Ambulation/Gait ?  ?  ?  ?  ?  ?  ?  ?General Gait Details: deferred due to low BP and pt feeling symptomatic ? ? ?Stairs ?  ?  ?  ?  ?  ? ? ?Wheelchair Mobility ?  ? ?Modified Rankin (Stroke Patients Only) ?  ? ? ?  ?Balance Overall balance assessment: Needs assistance ?Sitting-balance support: No upper extremity supported, Feet supported ?Sitting balance-Leahy Scale: Good ?  ?  ?Standing balance support: Bilateral upper extremity supported, Reliant on assistive device for balance ?Standing balance-Leahy Scale: Poor ?Standing balance comment: Reliant on RW ?  ?  ?  ?  ?  ?  ?  ?  ?  ?  ?  ?  ? ?  ?Cognition Arousal/Alertness: Awake/alert ?Behavior During Therapy: [facility] for tasks assessed/performed ?Overall Cognitive Status: Within Functional Limits for tasks assessed ?  ?  ?  ?  ?  ?  ?  ?  ?  ?  ?  ?  ?  ?  ?  ?  ?  ?  ?  ? ?  ?  Exercises General Exercises - Upper Extremity ?Shoulder Flexion: AROM, Strengthening, Both, 20 reps, Seated ?Shoulder ABduction: AROM, Strengthening, Both, 20 reps, Seated ?General Exercises - Lower Extremity ?Long Arc Quad: AROM, Strengthening, Both, 20 reps, Seated ?Hip ABduction/ADduction: AROM,  Strengthening, Both, 10 reps, Seated ?Hip Flexion/Marching: AROM, Strengthening, Both, 20 reps, Seated ? ?  ?General Comments General comments (skin integrity, edema, etc.): BP 75/49 sitting upon arrival, 77/64 sitting after standing 1x ?  ?  ? ?Pertinent Vitals/Pain Pain Assessment ?Pain Assessment: Faces ?Faces Pain Scale: Hurts a little bit ?Pain Location: L foot in WB ?Pain Descriptors / Indicators: Discomfort, Grimacing ?Pain Intervention(s): Limited activity within patient's tolerance, Monitored during session, Repositioned  ? ? ?Home Living   ?  ?  ?  ?  ?  ?  ?  ?  ?  ?   ?  ?Prior Function    ?  ?  ?   ? ?PT Goals (current goals can now be found in the care plan section) Acute Rehab PT Goals ?Patient Stated Goal: to walk ?PT Goal Formulation: With patient ?Time For Goal Achievement: 11/14/21 ?Potential to Achieve Goals: Good ?Progress towards PT goals: Progressing toward goals ? ?  ?Frequency ? ? ? Min 3X/week ? ? ? ?  ?PT Plan Current plan remains appropriate  ? ? ?Co-evaluation   ?  ?  ?  ?  ? ?  ?AM-PAC PT "6 Clicks" Mobility   ?Outcome Measure ? Help needed turning from your back to your side while in a flat bed without using bedrails?: None ?Help needed moving from lying on your back to sitting on the side of a flat bed without using bedrails?: A Little ?Help needed moving to and from a bed to a chair (including a wheelchair)?: A Little ?Help needed standing up from a chair using your arms (e.g., wheelchair or bedside chair)?: A Little ?Help needed to walk in hospital room?: A Little ?Help needed climbing 3-5 steps with a railing? : Total ?6 Click Score: 17 ? ?  ?End of Session Equipment Utilized During Treatment: Other (comment);Gait belt (CAM boot) ?Activity Tolerance: Other (comment) (limited by low BP) ?Patient left: in chair;with call bell/phone within reach ?Nurse Communication: Mobility status;Other (comment) (BP low, pt asking RN to look at dressing on L foot with boot doffed) ?PT Visit  Diagnosis: Unsteadiness on feet (R26.81);Other abnormalities of gait and mobility (R26.89);History of falling (Z91.81);Difficulty in walking, not elsewhere classified (R26.2) ?  ? ? ?Time: 9476-5465 ?PT Time Calculation (min) (ACUTE ONLY): 29 min ? ?Charges:  $Therapeutic Exercise: 8-22 mins ?$Therapeutic Activity: 8-22 mins          ?          ? ?Moishe Spice, PT, DPT ?Acute Rehabilitation Services  ?Pager: [phone] ?Office: [phone] ? ? ? ?Maretta Bees Pettis ?11/03/2021, 2:25 PM ? ?

## 2021-11-03 NOTE — Progress Notes (Signed)
BP 112/94 (102), doppler 110.  Marcus Johnson LVAD coordinator called.  [place] for doppler to be greater than 100.  Per Marcus Johnson Dr. Aundra Dubin aware.   ?

## 2021-11-03 NOTE — Progress Notes (Signed)
PT Cancellation Note ? ?Patient Details ?Name: Marcus Johnson. ?MRN: [id number] ?DOB: 04-Aug-1961 ? ? ?Cancelled Treatment:    Reason Eval/Treat Not Completed: Patient at procedure or test/unavailable. Pt at HD treatment. Will plan to follow-up later as time permits. ? ? ?Moishe Spice, PT, DPT ?Acute Rehabilitation Services  ?Pager: [phone] ?Office: [phone] ? ? ? ?Maretta Bees Pettis ?11/03/2021, 8:36 AM ? ? ?

## 2021-11-03 NOTE — Procedures (Signed)
removed 529ms net fluid. order for edw of 73. pt weight 72.5 kg bed scales.  pt unable to stand due to foot [pain and not having lvad vest in dialysis.  no blood pressures taken in dialysis.  Doppler Maps performed q 30 minutes ranged from 82 to 104.  pre map 104 post map 82.  pre weight 72.5 kg post weight 72.0kg bed scales.  gave vancomycin as ordered.  catheter ran well packed with heparin clamped capped and qwrapped. ?

## 2021-11-03 NOTE — Procedures (Signed)
I was present at this dialysis session. I have reviewed the session itself and made appropriate changes.  ? ?K 4.2 on 2 K bath.  UF goal 0.5L. Not able to get pre HD standing weight but will try for after HD weight, goal is 73kg.   ? ?Hb stable.   ? ?TDC.   ? ?Filed Weights  ? 11/02/21 0452 11/03/21 0422 11/03/21 0720  ?Weight: 73 kg 72.5 kg 72.5 kg  ? ? ?Recent Labs  ?Lab 11/03/21 ?0050  ?NA 138  ?K 4.2  ?CL 102  ?CO2 26  ?GLUCOSE 118*  ?BUN 38*  ?CREATININE 4.62*  ?CALCIUM 9.2  ? ? ?Recent Labs  ?Lab 11/01/21 ?6160 11/02/21 ?0203 11/03/21 ?0050  ?WBC 6.8 6.4 6.8  ?HGB 10.3* 10.1* 10.2*  ?HCT 31.9* 31.8* 30.3*  ?MCV 91.1 90.9 89.4  ?PLT 147* 147* 150  ? ? ?Scheduled Meds: ? atorvastatin  40 mg Oral Daily  ? Chlorhexidine Gluconate Cloth  6 each Topical Q0600  ? gabapentin  100 mg Oral Daily  ? gabapentin  200 mg Oral QHS  ? insulin aspart  0-5 Units Subcutaneous QHS  ? insulin aspart  0-9 Units Subcutaneous TID WC  ? midodrine  10 mg Oral 3 times per day on Mon Wed Fri  ? pantoprazole  40 mg Oral Daily  ? sodium chloride flush  3 mL Intravenous Q12H  ? Warfarin - Pharmacist Dosing Inpatient   Does not apply [id number]  ? ?Continuous Infusions: ? sodium chloride    ? vancomycin Stopped (11/01/21 1248)  ? ?PRN Meds:.sodium chloride, acetaminophen, naphazoline-glycerin, ondansetron (ZOFRAN) IV, sodium chloride flush, traMADol   ?Pearson Grippe  MD ?11/03/2021, 8:31 AM ?  ?

## 2021-11-03 NOTE — Progress Notes (Signed)
LVAD Coordinator Rounding Note: ? ?HM III LVAD implanted on 10/13/20 by Dr Cyndia Bent under Destination Therapy criteria. ? ?Pt admitted from ED to Dr. Claris Gladden service due to multiple falls and syncope. ? ?Met pt in dialysis this morning. Pt is in good spirits today and says the pain in his foot is getting better.  ? ?Patient says he is concerned about 3 flights of stairs at home with the fractures in his left foot. He has minimal weight bearing even with boot in place. He reports Dr. Aundra Dubin ordered consult for IP rehab this admission. Pt and family discussed options of alternative places to stay (on one level) and have been unable to come up with any. Third story home is only option for patient if discharged home (no elevator). If IP rehab not an option, skilled nursing may be next option. CIR is awaiting insurance approval. ? ?Vital signs: ?Temp: 97.7 ?HR: 70 ?Doppler:  104 ?Auto cuff:  116/88 (98) ?O2 Sat: 100% on RA ?Wt: 156.5>155.8>160.5>168.6>160.9>159.8 lbs   ? ?LVAD interrogation reveals:  ?Speed: 5800 ?Flow: 5.1 ?Power: 4.7w ?PI: 3.5 ?Hematocrit: 30 ? ?Alarms: none ?Events: on batteries  ? ?Fixed speed: 5800 ?Low speed limit: 5500 ? ?Drive Line: CDI. Drive line anchor secure. Next dressing change will be due 11/06/21. Continue twice weekly dressing changes.  ? ?Labs:  ? ?LDH trend: 177>173>189>171>164 ? ?INR trend: 1.8>1.8>2.0>1.9>2.1 ? ?Anticoagulation Plan: ?-INR Goal: 2.0-2.5 ?-ASA Dose: none ? ?Device:  ?-  Medtronic single ?-  Pacing: VVI 40 ?- Therapies: on 200 bpm ? ?Plan/Recommendations:  ?1. Call VAD coordinator for equipment or drive line issues ?2. Twice weekly dressing changes. Next dressing due 11/06/20. VAD coordinator will evaluate for possible advancement back to weekly. ? ?Tanda Rockers, RN ?VAD Coordinator  ?Office: [phone]  ?24/7 Pager: [phone]  ?  ? ? ? ? ? ? ? ?

## 2021-11-04 LAB — BASIC METABOLIC PANEL
Anion gap: 8 (ref 5–15)
BUN: 19 mg/dL (ref 6–20)
CO2: 29 mmol/L (ref 22–32)
Calcium: 9.2 mg/dL (ref 8.9–10.3)
Chloride: 100 mmol/L (ref 98–111)
Creatinine, Ser: 3.01 mg/dL — ABNORMAL HIGH (ref 0.61–1.24)
GFR, Estimated: 23 mL/min — ABNORMAL LOW (ref >60)
Glucose, Bld: 95 mg/dL (ref 70–99)
Potassium: 3.8 mmol/L (ref 3.5–5.1)
Sodium: 137 mmol/L (ref 135–145)

## 2021-11-04 LAB — AEROBIC/ANAEROBIC CULTURE W GRAM STAIN (SURGICAL/DEEP WOUND)
Culture: NO GROWTH
Gram Stain: NONE SEEN

## 2021-11-04 LAB — CBC
HCT: 30.8 % — ABNORMAL LOW (ref 39.0–52.0)
Hemoglobin: 10.2 g/dL — ABNORMAL LOW (ref 13.0–17.0)
MCH: 29.7 pg (ref 26.0–34.0)
MCHC: 33.1 g/dL (ref 30.0–36.0)
MCV: 89.5 fL (ref 80.0–100.0)
Platelets: 150 10*3/uL (ref 150–400)
RBC: 3.44 MIL/uL — ABNORMAL LOW (ref 4.22–5.81)
RDW: 13.1 % (ref 11.5–15.5)
WBC: 6.9 10*3/uL (ref 4.0–10.5)
nRBC: 0 % (ref 0.0–0.2)

## 2021-11-04 LAB — GLUCOSE, CAPILLARY
Glucose-Capillary: 78 mg/dL (ref 70–99)
Glucose-Capillary: 98 mg/dL (ref 70–99)
Glucose-Capillary: 98 mg/dL (ref 70–99)
Glucose-Capillary: 122 mg/dL — ABNORMAL HIGH (ref 70–99)

## 2021-11-04 LAB — MAGNESIUM: Magnesium: 1.9 mg/dL (ref 1.7–2.4)

## 2021-11-04 LAB — PROTIME-INR
INR: 2.1 — ABNORMAL HIGH (ref 0.8–1.2)
Prothrombin Time: 23.8 seconds — ABNORMAL HIGH (ref 11.4–15.2)

## 2021-11-04 LAB — LACTATE DEHYDROGENASE: LDH: 164 U/L (ref 98–192)

## 2021-11-04 NOTE — Progress Notes (Signed)
Admit: 10/30/2021 ?LOS: 5 ? ?6M ESRD on HD [facility] TDC MWF, LVAD; s/p fall at home and s/p L metatarsal fracture ? ?Subjective:  ?HD yesterday, 0.5 UF, unable to get standign weights, bed weight was 72.5kg ?Appealing denial for CIR ?Felt a tad dizzy post HD, but definitely an improvement ? ?03/31 0701 - 04/01 0700 ?In: -  ?Out: 725 [Urine:225] ? ?Filed Weights  ? 11/03/21 0422 11/03/21 0720 11/04/21 0519  ?Weight: 72.5 kg 72.5 kg 71.7 kg  ? ? ?Scheduled Meds: ? atorvastatin  40 mg Oral Daily  ? Chlorhexidine Gluconate Cloth  6 each Topical Q0600  ? gabapentin  100 mg Oral Daily  ? gabapentin  200 mg Oral QHS  ? insulin aspart  0-5 Units Subcutaneous QHS  ? insulin aspart  0-9 Units Subcutaneous TID WC  ? midodrine  10 mg Oral 3 times per day on Mon Wed Fri  ? pantoprazole  40 mg Oral Daily  ? sodium chloride flush  3 mL Intravenous Q12H  ? warfarin  4.5 mg Oral q1600  ? Warfarin - Pharmacist Dosing Inpatient   Does not apply [id number]  ? ?Continuous Infusions: ? sodium chloride    ? vancomycin Stopped (11/03/21 1204)  ? ?PRN Meds:.sodium chloride, acetaminophen, naphazoline-glycerin, ondansetron (ZOFRAN) IV, sodium chloride flush, traMADol ? ?Current Labs: reviewed  ? ? ?Physical Exam:  Blood pressure (!) 114/95, pulse 87, temperature 98.6 ?F (37 ?C), temperature source Oral, resp. rate 18, height '5\' 10"'$  (1.778 m), weight 71.7 kg, SpO2 98 %. ?[place] NAD in bed ?Pleasant positive affect ?Cont hum on chest exam ?Nl WOB ?R IJ TDC c/d/I ?No sig LEE ?Nonfocal CN2-12 intact ? ?Dialysis Orders: Center: [facility] MWF ?4 hrs 180NRe 400/Autoflow 1.5 71 kg 2.0K/2.0 Ca TDC ?-No heparin ?-Mircera 30 mcg IV q 2 weeks (last dose 10/25/2021) ?-Venofer 50 mg IV weekly ?-No VDRA ? ?A ?ESRD MWF [facility] T[facility] ?Syncope post HD / malaise post HD; needs EDW increased ?HFrEFS/p LVAD, on vanc for ? Driveline infection ?S/p L metatarsal fractures, ortho following, PT/OT; CIR? ?Anemia, hb stable, not due for ESA ?CMD-BMD: not on binders, Ca ok, CTM ?DM2 ? ?P ?Set  73kg as new EDW ?HD next 4/3 on MWF, UF to 73kg EDW, not to exceed 2L, 2K, TDC, 400/800, no heparin; need standing labs  ?PT/OT, ? CIR ?Medication Issues; ?Preferred narcotic agents for pain control are hydromorphone, fentanyl, and methadone. Morphine should not be used.  ?Baclofen should be avoided ?Avoid oral sodium phosphate and magnesium citrate based laxatives / bowel preps  ? ? ?Pearson Grippe MD ?11/04/2021, 6:41 AM ? ?Recent Labs  ?Lab 11/02/21 ?0203 11/03/21 ?5643 11/04/21 ?0116  ?NA 137 138 137  ?K 4.0 4.2 3.8  ?CL 100 102 100  ?CO2 '26 26 29  '$ ?GLUCOSE 79 118* 95  ?BUN 23* 38* 19  ?CREATININE 3.45* 4.62* 3.01*  ?CALCIUM 9.4 9.2 9.2  ? ? ?Recent Labs  ?Lab 11/02/21 ?0203 11/03/21 ?3295 11/04/21 ?0116  ?WBC 6.4 6.8 6.9  ?HGB 10.1* 10.2* 10.2*  ?HCT 31.8* 30.3* 30.8*  ?MCV 90.9 89.4 89.5  ?PLT 147* 150 150  ? ? ? ? ? ? ? ? ? ? ?  ?

## 2021-11-04 NOTE — Progress Notes (Signed)
Patient ID: Marcus Swails., male   DOB: 04-12-1961, 61 y.o.   MRN: [id number] ?  ?Advanced Heart Failure VAD Team Note ? ?PCP-Cardiologist: None  ? ?Subjective:   ? ?Started on empiric vancomycin for suspected DL infection. Wound culture NGTD.  Afebrile.  ? ?MAPs now 90s-100s, denies lightheadedness currently. Still takes a while for him to recover from HD.  Feeling better with increased EDW at HD.   ? ?LVAD INTERROGATION:  ?HeartMate III LVAD:   ?Flow 5.1 liters/min, speed 5800, power 4.6, PI 3.4.  No PIs since just after HD yesterday.  ? ?Objective:   ? ?Vital Signs:   ?Temp:  [97.7 ?F (36.5 ?C)-98.6 ?F (37 ?C)] 98.6 ?F (37 ?C) (04/01 0519) ?Pulse Rate:  [81-93] 87 (04/01 0519) ?Resp:  [12-21] 18 (04/01 0519) ?BP: (98-132)/(87-118) 114/95 (04/01 0519) ?SpO2:  [98 %-99 %] 98 % (04/01 0519) ?Weight:  [71.7 kg] 71.7 kg (04/01 0519) ?Last BM Date : 10/30/21 ?Mean arterial Pressure  90s-100s ? ?Intake/Output:  ? ?Intake/Output Summary (Last 24 hours) at 11/04/2021 0814 ?Last data filed at 11/04/2021 0800 ?Gross per 24 hour  ?Intake 240 ml  ?Output 725 ml  ?Net -485 ml  ?  ? ?Physical Exam  ?  ?General: Well appearing this am. NAD.  ?HEENT: Normal. ?Neck: Supple, JVP 7-8 cm. Carotids OK.  ?Cardiac:  Mechanical heart sounds with LVAD hum present.  ?Lungs:  CTAB, normal effort.  ?Abdomen:  NT, ND, no HSM. No bruits or masses. +BS  ?LVAD exit site: Well-healed and incorporated. Dressing dry and intact. No erythema or drainage. Stabilization device present and accurately applied. Driveline dressing changed daily per sterile technique. ?Extremities:  Warm and dry. No cyanosis, clubbing, rash, or edema.  ?Neuro:  Alert & oriented x 3. Cranial nerves grossly intact. Moves all 4 extremities w/o difficulty. Affect pleasant   ? ?Telemetry  ? ?NSR 80s  ? ?EKG  ?  ?No new EKG  ? ?Labs  ? ?Basic Metabolic Panel: ?Recent Labs  ?Lab 10/31/21 ?0118 11/01/21 ?7829 11/02/21 ?0203 11/03/21 ?5621 11/04/21 ?0116  ?NA 138 136 137 138 137   ?K 3.1* 4.0 4.0 4.2 3.8  ?CL 99 99 100 102 100  ?CO2 '28 24 26 26 29  '$ ?GLUCOSE 129* 93 79 118* 95  ?BUN 25* 44* 23* 38* 19  ?CREATININE 3.89* 5.23* 3.45* 4.62* 3.01*  ?CALCIUM 8.9 9.3 9.4 9.2 9.2  ?MG  --   --  1.7 2.3 1.9  ? ? ?Liver Function Tests: ?Recent Labs  ?Lab 10/30/21 ?1046  ?AST 28  ?ALT 13  ?ALKPHOS 95  ?BILITOT 1.1  ?PROT 7.9  ?ALBUMIN 3.6  ? ?No results for input(s): LIPASE, AMYLASE in the last 168 hours. ?No results for input(s): AMMONIA in the last 168 hours. ? ?CBC: ?Recent Labs  ?Lab 10/31/21 ?0118 11/01/21 ?3086 11/02/21 ?0203 11/03/21 ?5784 11/04/21 ?0116  ?WBC 6.8 6.8 6.4 6.8 6.9  ?HGB 10.9* 10.3* 10.1* 10.2* 10.2*  ?HCT 32.8* 31.9* 31.8* 30.3* 30.8*  ?MCV 90.1 91.1 90.9 89.4 89.5  ?PLT 138* 147* 147* 150 150  ? ? ?INR: ?Recent Labs  ?Lab 10/31/21 ?0118 11/01/21 ?6962 11/02/21 ?0203 11/03/21 ?9528 11/04/21 ?0116  ?INR 1.8* 2.0* 1.9* 2.1* 2.1*  ? ? ?Other results: ?EKG:  ? ? ?Imaging  ? ?No results found. ? ? ?Medications:   ? ? ?Scheduled Medications: ? atorvastatin  40 mg Oral Daily  ? Chlorhexidine Gluconate Cloth  6 each Topical Q0600  ? gabapentin  100 mg  Oral Daily  ? gabapentin  200 mg Oral QHS  ? insulin aspart  0-5 Units Subcutaneous QHS  ? insulin aspart  0-9 Units Subcutaneous TID WC  ? midodrine  10 mg Oral 3 times per day on Mon Wed Fri  ? pantoprazole  40 mg Oral Daily  ? sodium chloride flush  3 mL Intravenous Q12H  ? warfarin  4.5 mg Oral q1600  ? Warfarin - Pharmacist Dosing Inpatient   Does not apply [id number]  ? ? ?Infusions: ? sodium chloride    ? vancomycin Stopped (11/03/21 1204)  ? ? ?PRN Medications: ?sodium chloride, acetaminophen, naphazoline-glycerin, ondansetron (ZOFRAN) IV, sodium chloride flush, traMADol ? ? ?Patient Profile  ? ?61 y.o. male with history of chornic systolic CHF/ICM s/p Medtronic single chamber ICD, HM III LVAD, CAD s/p CABG, ESRD on HD. Now presenting with multiple falls and ? Seizure activity/syncopal episodes.  ? ?Assessment/Plan:   ? ?1. Falls:  Weakness and multiple falls prior to admission. Suspect too much fluid off with HD and orthostasis. No sustained VT on device interrogation.  Doing better now with higher dry weight.  ?- Continue higher EDW per nephrology.  ?- Continue midodrine 10 tid on HD days, have stopped non-HD midodrine with higher MAP.  ?2. Chronic systolic CHF:  Ischemic cardiomyopathy. Has Medtronic ICD. Low output HF, admitted and Impella 5.5 placed 10/04/20.  HM3 LVAD was placed on 10/13/20. Developed post-op renal failure requiring eventual hemodialysis.  MAP now elevated.  For now, will accept elevated MAP on non-HD days.  LDH stable.  Volume looks okay on exam, managed with HD. NYHA class II.   ?- Continue midodrine 10 mg tid on HD days, stopped midodrine on non-HD days.   ?3. LVAD HM III:  VAD interrogated personally.  He is now off ASA.  ?- Continue warfarin with INR goal 2-2.5.  PharmD to dose warfarin.  ?- Treating with vancomycin for driveline infection. Bloody drainage noted on admit. Suspect trauma from recent fall. ?4. CAD s/p CABG 2002:  Last cath in 6/19 with patent grafts. No chest pain.  ?- Continue Crestor.  ?5. ESRD: Currently dialyzing via tunneled catheter. No low flow events.  ?- Continue HD M-W-F. Nephrology following  ?- With continuous flow LVAD, think AV fistula would be unlikely to mature.   ?6. Atrial flutter/fibrillation: s/p DC-CV 08/21/18.   ?- on coumadin  ?7. 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  ?- Continue gabapentin ?8. DM: SSI  ?9. Anemia: Hgb stable on recent labs.   ?10. Suspected Gastroparesis. Nausea improved on Reglan.  ?11. Left Metatarsal Fractures: X-rays showed left 5th and possibly 4th MT base fxs. Ortho consulted: conservative management ?- plan WBAT in CAM boot ?- will f/u w/ ortho in 2 wks  ?12. Possible driveline infection: Wound culture negative. Has been on vancomycin, transition to doxycycline at discharge to  complete 2 wks treatment.  ?13. Deconditioning: Appealing CIR denial.  If unable to go to CIR, will need SNF stay. Currently unable to climb to his 3rd floor apt.   ? ?I reviewed the LVAD parameters from today, and compared the results to the patient's prior recorded data.  No programming changes were made.  The LVAD is functioning within specified parameters.  The patient performs LVAD self-test daily.  LVAD interrogation was negative for any significant power changes, alarms or PI events/speed drops.  LVAD equipment check completed and is in good working order.  Back-up equipment present.  LVAD education done on emergency procedures and precautions and reviewed exit site care. ? ?Length of Stay: 5 ? ?Loralie Champagne, MD ?11/04/2021, 8:14 AM ? ?VAD Team --- VAD ISSUES ONLY--- ?Pager [phone] (7am - 7am) ? ?Advanced Heart Failure Team  ?Pager [phone] (M-F; 7a - 5p)  ?Please contact [facility] for night-coverage after hours (5p -7a ) and weekends on [email or web address] ? ?

## 2021-11-04 NOTE — Plan of Care (Signed)

## 2021-11-04 NOTE — Progress Notes (Signed)
ANTICOAGULATION CONSULT NOTE ? ?Pharmacy Consult for warfarin ?Indication:  LVAD HM3 ? ?Allergies  ?Allergen Reactions  ? Meclizine Hcl Anaphylaxis and Swelling  ? Ivabradine Nausea Only  ? ? ?Patient Measurements: ?Height: '5\' 10"'$  (177.8 cm) ?Weight: 71.7 kg (158 lb 1.1 oz) ?IBW/kg (Calculated) : 73 ?Heparin Dosing Weight: 71.7 kg ? ?Vital Signs: ?Temp: 98.3 ?F (36.8 ?C) (04/01 1007) ?Temp Source: Oral (04/01 1007) ?BP: 116/85 (04/01 1007) ?Pulse Rate: 87 (04/01 0519) ? ?Labs: ?Recent Labs  ?  11/02/21 ?0203 11/03/21 ?5701 11/04/21 ?0116  ?HGB 10.1* 10.2* 10.2*  ?HCT 31.8* 30.3* 30.8*  ?PLT 147* 150 150  ?LABPROT 21.5* 23.9* 23.8*  ?INR 1.9* 2.1* 2.1*  ?CREATININE 3.45* 4.62* 3.01*  ? ? ? ?Estimated Creatinine Clearance: 26.5 mL/min (A) (by C-G formula based on SCr of 3.01 mg/dL (H)). ? ? ?Medical History: ?Past Medical History:  ?Diagnosis Date  ? AICD (automatic cardioverter/defibrillator) present   ? Single-chamber  implantable cardiac defibrillator - Medtronic  ? Atrial fibrillation ([place])   ? Cataract   ? Mixed form OU  ? CHF (congestive heart failure) ([place])   ? Chronic kidney disease   ? Chronic kidney disease (CKD), stage III (moderate) (HCC)   ? Chronic systolic heart failure ([place])   ? a. Echo 5/13: Mild LVE, mild LVH, EF 10%, anteroseptal, lateral, apical AK, mild MR, mild LAE, moderately reduced RVSF, mild RAE, PASP 60;  b. 07/2014 Echo: EF 20-2%, diff HK, AKI of antsep/apical/mid-apicalinferior, mod reduced RV.  ? Coronary artery disease   ? a. s/p CABG 2002 b. LHC 5/13:  dLM 80%, LAD subtotally occluded, pCFX occluded, pRCA 50%, mid? Occlusion with high take off of the PDA with 70% multiple lesions-not bypassed and supplies collaterals to LAD, LIMA-IM/ramus ok, S-OM ok, S-PLA branches ok. Medical therapy was recommended  ? COVID   ? Diabetic retinopathy ([place])   ? NPDR OD, PDR OS  ? Dyspnea   ? Gout   ? "on daily RX" (01/08/2018)  ? Hypertension   ? Hypertensive retinopathy   ? OU  ? Ischemic cardiomyopathy    ? a. Echo 5/13: Mild LVE, mild LVH, EF 10%, anteroseptal, lateral, apical AK, mild MR, mild LAE, moderately reduced RVSF, mild RAE, PASP 60;  b. 01/2012 s/p MDT [id number] Protecta XT VR AICD;  c. 07/2014 Echo: EF 20-2%, diff HK, AKI of antsep/apical/mid-apicalinferior, mod reduced RV.  ? MRSA (methicillin resistant Staphylococcus aureus)   ? Status post right foot plantar deep infection with MRSA status post  I&D 10/2008  ? Myocardial infarction [facility])   ? "was told I'd had several before heart OR in 2002" (01/08/2018)  ? Noncompliance   ? Peripheral neuropathy   ? Retinopathy, diabetic, background ([place])   ? Syncope   ? Type II diabetes mellitus ([place])   ? requiring insulin   ? Vitreous hemorrhage, left (HCC)   ? and proliferative diabetic retinopathy  ? ? ?Medications:  ?Scheduled:  ? atorvastatin  40 mg Oral Daily  ? Chlorhexidine Gluconate Cloth  6 each Topical Q0600  ? gabapentin  100 mg Oral Daily  ? gabapentin  200 mg Oral QHS  ? insulin aspart  0-5 Units Subcutaneous QHS  ? insulin aspart  0-9 Units Subcutaneous TID WC  ? midodrine  10 mg Oral 3 times per day on Mon Wed Fri  ? pantoprazole  40 mg Oral Daily  ? sodium chloride flush  3 mL Intravenous Q12H  ? warfarin  4.5 mg Oral q1600  ?  Warfarin - Pharmacist Dosing Inpatient   Does not apply [id number]  ? ? ?Assessment: ?40 yom presenting after fall, head CT negative. Was found to have mild displaced fractures at 5h metatarsal. On warfarin PTA for hx LVAD HM3 - LD 3/26'@1900'$ . ? ?INR therapeutic today at 2.1.  LDH stable in the 160s. . ? ?PTA regimen 4.5 mg daily except 6 mg on Tuesday. ? ?Goal of Therapy:  ?INR 2-2.5 ?Monitor platelets by anticoagulation protocol: Yes ?  ?Plan:  ?Warfarin 4.'5mg'$  daily  ?Monitor daily INR ? ?Hildred Laser, PharmD ?Clinical Pharmacist ?**Pharmacist phone directory can now be found on [email or web address] ([email or web address]).  Listed under [place]. ? ? ? ? ?

## 2021-11-04 NOTE — Progress Notes (Signed)
Pharmacy Antibiotic Note ? ?Marcus Johnson. is a 61 y.o. male admitted on 10/30/2021 with  driveline infection .  Pharmacy has been consulted for vancomycin dosing. He is noted with ESRD with HD on MWF.  ?-last HD 3/31 ?-WBC= 6.9, afebrile ?-cultures- ngtd ? ? ?Plan: ?Continue vancomycin 750 mg IV MWF ?Monitor HD schedule, cx results, clinical pic, and vanc levels as appropriate ? ?Height: '5\' 10"'$  (177.8 cm) ?Weight: 71.7 kg (158 lb 1.1 oz) ?IBW/kg (Calculated) : 73 ? ?Temp (24hrs), Avg:98.1 ?F (36.7 ?C), Min:97.7 ?F (36.5 ?C), Max:98.6 ?F (37 ?C) ? ?Recent Labs  ?Lab 10/31/21 ?0118 11/01/21 ?2671 11/02/21 ?0203 11/03/21 ?2458 11/04/21 ?0116  ?WBC 6.8 6.8 6.4 6.8 6.9  ?CREATININE 3.89* 5.23* 3.45* 4.62* 3.01*  ?VANCORANDOM  --   --   --  15  --   ? ?  ?Estimated Creatinine Clearance: 26.5 mL/min (A) (by C-G formula based on SCr of 3.01 mg/dL (H)).   ? ?Allergies  ?Allergen Reactions  ? Meclizine Hcl Anaphylaxis and Swelling  ? Ivabradine Nausea Only  ? ? ?Antimicrobials this admission: ?Vancomycin 3/27 >>  ? ?Dose adjustments this admission: ?N/A ? ?Microbiology results: ?3/27 wound cx- ngtd ?3/27 COVID/Flu PCR: neg ? ?Thank you for allowing pharmacy to be a part of this patient?s care. ? ?Hildred Laser, PharmD ?Clinical Pharmacist ?**Pharmacist phone directory can now be found on [email or web address] ([email or web address]).  Listed under [place]. ? ? ?

## 2021-11-05 LAB — GLUCOSE, CAPILLARY
Glucose-Capillary: 82 mg/dL (ref 70–99)
Glucose-Capillary: 94 mg/dL (ref 70–99)
Glucose-Capillary: 102 mg/dL — ABNORMAL HIGH (ref 70–99)
Glucose-Capillary: 125 mg/dL — ABNORMAL HIGH (ref 70–99)

## 2021-11-05 LAB — LACTATE DEHYDROGENASE: LDH: 156 U/L (ref 98–192)

## 2021-11-05 LAB — BASIC METABOLIC PANEL
Anion gap: 15 (ref 5–15)
BUN: 36 mg/dL — ABNORMAL HIGH (ref 6–20)
CO2: 25 mmol/L (ref 22–32)
Calcium: 9.7 mg/dL (ref 8.9–10.3)
Chloride: 100 mmol/L (ref 98–111)
Creatinine, Ser: 4.25 mg/dL — ABNORMAL HIGH (ref 0.61–1.24)
GFR, Estimated: 15 mL/min — ABNORMAL LOW (ref >60)
Glucose, Bld: 87 mg/dL (ref 70–99)
Potassium: 3.9 mmol/L (ref 3.5–5.1)
Sodium: 140 mmol/L (ref 135–145)

## 2021-11-05 LAB — CBC
HCT: 30.5 % — ABNORMAL LOW (ref 39.0–52.0)
Hemoglobin: 9.8 g/dL — ABNORMAL LOW (ref 13.0–17.0)
MCH: 29.2 pg (ref 26.0–34.0)
MCHC: 32.1 g/dL (ref 30.0–36.0)
MCV: 90.8 fL (ref 80.0–100.0)
Platelets: 140 10*3/uL — ABNORMAL LOW (ref 150–400)
RBC: 3.36 MIL/uL — ABNORMAL LOW (ref 4.22–5.81)
RDW: 13.2 % (ref 11.5–15.5)
WBC: 6.9 10*3/uL (ref 4.0–10.5)
nRBC: 0 % (ref 0.0–0.2)

## 2021-11-05 LAB — PROTIME-INR
INR: 2.1 — ABNORMAL HIGH (ref 0.8–1.2)
Prothrombin Time: 23.4 seconds — ABNORMAL HIGH (ref 11.4–15.2)

## 2021-11-05 LAB — MAGNESIUM: Magnesium: 1.9 mg/dL (ref 1.7–2.4)

## 2021-11-05 NOTE — Progress Notes (Signed)
Admit: 10/30/2021 ?LOS: 6 ? ?17M ESRD on HD [facility] TDC MWF, LVAD; s/p fall at home and s/p L metatarsal fracture ? ?Subjective:  ?No interval events ?Seen in room ? ?04/01 0701 - 04/02 0700 ?In: 720 [P.O.:720] ?Out: 250 [Urine:250] ? ?Filed Weights  ? 11/03/21 0720 11/04/21 0519 11/05/21 0510  ?Weight: 72.5 kg 71.7 kg 73.4 kg  ? ? ?Scheduled Meds: ? atorvastatin  40 mg Oral Daily  ? Chlorhexidine Gluconate Cloth  6 each Topical Q0600  ? gabapentin  100 mg Oral Daily  ? gabapentin  200 mg Oral QHS  ? insulin aspart  0-5 Units Subcutaneous QHS  ? insulin aspart  0-9 Units Subcutaneous TID WC  ? midodrine  10 mg Oral 3 times per day on Mon Wed Fri  ? pantoprazole  40 mg Oral Daily  ? sodium chloride flush  3 mL Intravenous Q12H  ? warfarin  4.5 mg Oral q1600  ? Warfarin - Pharmacist Dosing Inpatient   Does not apply E2800  ? ?Continuous Infusions: ? sodium chloride    ? vancomycin Stopped (11/03/21 1204)  ? ?PRN Meds:.sodium chloride, acetaminophen, naphazoline-glycerin, ondansetron (ZOFRAN) IV, sodium chloride flush, traMADol ? ?Current Labs: reviewed  ? ? ?Physical Exam:  Blood pressure (!) 117/98, pulse 89, temperature 98.7 ?F (37.1 ?C), temperature source Oral, resp. rate 19, height '5\' 10"'$  (1.778 m), weight 73.4 kg, SpO2 100 %. ?Marcus Johnson NAD sitting at edge of bed ?Pleasant positive affect ?Cont hum on chest exam ?Nl WOB ?R IJ TDC c/d/I ?No sig LEE ?Nonfocal CN2-12 intact ? ?Dialysis Orders: Center: [facility] MWF ?4 hrs 180NRe 400/Autoflow 1.5 71 kg 2.0K/2.0 Ca TDC ?-No heparin ?-Mircera 30 mcg IV q 2 weeks (last dose 10/25/2021) ?-Venofer 50 mg IV weekly ?-No VDRA ? ?A ?ESRD MWF [facility] TDC ?Syncope post HD / malaise post HD; needs EDW increased probably to 73kg? Weights have been labile ?HFrEFS/p LVAD, on vanc for ? Driveline infection ?S/p L metatarsal fractures, ortho following, PT/OT; CIR deneid but in process of appealing ?Anemia, hb stable, due for ESA 4/5 ?CMD-BMD: not on binders, Ca ok, CTM ?DM2 ? ?P ?Set 73kg as new EDW;  need standing pre/post weights ?HD next 4/3 on MWF, UF to 73kg EDW, not to exceed 1.5L, 3K, TDC, 400/600, no heparin; standing labs  ?PT/OT, appeal for CIR in process ?Medication Issues; ?Preferred narcotic agents for pain control are hydromorphone, fentanyl, and methadone. Morphine should not be used.  ?Baclofen should be avoided ?Avoid oral sodium phosphate and magnesium citrate based laxatives / bowel preps  ? ? ?Pearson Grippe MD ?11/05/2021, 8:08 AM ? ?Recent Labs  ?Lab 11/03/21 ?0050 11/04/21 ?0116 11/05/21 ?3491  ?NA 138 137 140  ?K 4.2 3.8 3.9  ?CL 102 100 100  ?CO2 '26 29 25  '$ ?GLUCOSE 118* 95 87  ?BUN 38* 19 36*  ?CREATININE 4.62* 3.01* 4.25*  ?CALCIUM 9.2 9.2 9.7  ? ? ?Recent Labs  ?Lab 11/03/21 ?0050 11/04/21 ?0116 11/05/21 ?7915  ?WBC 6.8 6.9 6.9  ?HGB 10.2* 10.2* 9.8*  ?HCT 30.3* 30.8* 30.5*  ?MCV 89.4 89.5 90.8  ?PLT 150 150 140*  ? ? ? ? ? ? ? ? ? ? ?  ?

## 2021-11-05 NOTE — Progress Notes (Signed)
Patient ID: Marcus Johnson., male   DOB: [birth year], 61 y.o.   MRN: [id number] ?  ?Advanced Heart Failure VAD Team Note ? ?PCP-Cardiologist: None  ? ?Subjective:   ? ?Started on empiric vancomycin for suspected DL infection. Wound culture NGTD.  Afebrile.  ? ?MAPs now 80s-100s, denies lightheadedness.  Feeling better with increased EDW at HD.  Pain in left foot is main limitation now.  ? ?LVAD INTERROGATION:  ?HeartMate III LVAD:   ?Flow 5.5 liters/min, speed 5800, power 4.6, PI 2.4.  About 8 PI events yesterday.   ? ?Objective:   ? ?Vital Signs:   ?Temp:  [98.3 ?F (36.8 ?C)-98.7 ?F (37.1 ?C)] 98.7 ?F (37.1 ?C) (04/02 0345) ?Pulse Rate:  [79-89] 89 (04/02 0345) ?Resp:  [16-19] 19 (04/02 0345) ?BP: (92-117)/(71-98) 117/98 (04/02 0345) ?SpO2:  [98 %-100 %] 100 % (04/02 0345) ?Weight:  [73.4 kg] 73.4 kg (04/02 0510) ?Last BM Date : 11/04/21 ?Mean arterial Pressure  80s-100s ? ?Intake/Output:  ? ?Intake/Output Summary (Last 24 hours) at 11/05/2021 0751 ?Last data filed at 11/05/2021 0400 ?Gross per 24 hour  ?Intake 720 ml  ?Output 250 ml  ?Net 470 ml  ?  ? ?Physical Exam  ?  ?General: Well appearing this am. NAD.  ?HEENT: Normal. ?Neck: Supple, JVP 8 cm. Carotids OK.  ?Cardiac:  Mechanical heart sounds with LVAD hum present.  ?Lungs:  CTAB, normal effort.  ?Abdomen:  NT, ND, no HSM. No bruits or masses. +BS  ?LVAD exit site: Well-healed and incorporated. Dressing dry and intact. No erythema or drainage. Stabilization device present and accurately applied. Driveline dressing changed daily per sterile technique. ?Extremities:  Warm and dry. No cyanosis, clubbing, rash, or edema. Left foot in boot.  ?Neuro:  Alert & oriented x 3. Cranial nerves grossly intact. Moves all 4 extremities w/o difficulty. Affect pleasant   ? ? ?Telemetry  ? ?NSR 80s  ? ?EKG  ?  ?No new EKG  ? ?Labs  ? ?Basic Metabolic Panel: ?Recent Labs  ?Lab 11/01/21 ?5176 11/02/21 ?0203 11/03/21 ?0050 11/04/21 ?0116 11/05/21 ?1607  ?NA 136 137 138 137 140   ?K 4.0 4.0 4.2 3.8 3.9  ?CL 99 100 102 100 100  ?CO2 '24 26 26 29 25  '$ ?GLUCOSE 93 79 118* 95 87  ?BUN 44* 23* 38* 19 36*  ?CREATININE 5.23* 3.45* 4.62* 3.01* 4.25*  ?CALCIUM 9.3 9.4 9.2 9.2 9.7  ?MG  --  1.7 2.3 1.9 1.9  ? ? ?Liver Function Tests: ?Recent Labs  ?Lab 10/30/21 ?1046  ?AST 28  ?ALT 13  ?ALKPHOS 95  ?BILITOT 1.1  ?PROT 7.9  ?ALBUMIN 3.6  ? ?No results for input(s): LIPASE, AMYLASE in the last 168 hours. ?No results for input(s): AMMONIA in the last 168 hours. ? ?CBC: ?Recent Labs  ?Lab 11/01/21 ?3710 11/02/21 ?0203 11/03/21 ?0050 11/04/21 ?0116 11/05/21 ?6269  ?WBC 6.8 6.4 6.8 6.9 6.9  ?HGB 10.3* 10.1* 10.2* 10.2* 9.8*  ?HCT 31.9* 31.8* 30.3* 30.8* 30.5*  ?MCV 91.1 90.9 89.4 89.5 90.8  ?PLT 147* 147* 150 150 140*  ? ? ?INR: ?Recent Labs  ?Lab 11/01/21 ?4854 11/02/21 ?0203 11/03/21 ?0050 11/04/21 ?0116 11/05/21 ?6270  ?INR 2.0* 1.9* 2.1* 2.1* 2.1*  ? ? ?Other results: ?EKG:  ? ? ?Imaging  ? ?No results found. ? ? ?Medications:   ? ? ?Scheduled Medications: ? atorvastatin  40 mg Oral Daily  ? Chlorhexidine Gluconate Cloth  6 each Topical Q0600  ? gabapentin  100 mg Oral  Daily  ? gabapentin  200 mg Oral QHS  ? insulin aspart  0-5 Units Subcutaneous QHS  ? insulin aspart  0-9 Units Subcutaneous TID WC  ? midodrine  10 mg Oral 3 times per day on Mon Wed Fri  ? pantoprazole  40 mg Oral Daily  ? sodium chloride flush  3 mL Intravenous Q12H  ? warfarin  4.5 mg Oral q1600  ? Warfarin - Pharmacist Dosing Inpatient   Does not apply [id number]  ? ? ?Infusions: ? sodium chloride    ? vancomycin Stopped (11/03/21 1204)  ? ? ?PRN Medications: ?sodium chloride, acetaminophen, naphazoline-glycerin, ondansetron (ZOFRAN) IV, sodium chloride flush, traMADol ? ? ?Patient Profile  ? ?61 y.o. male with history of chornic systolic CHF/ICM s/p Medtronic single chamber ICD, HM III LVAD, CAD s/p CABG, ESRD on HD. Now presenting with multiple falls and ? Seizure activity/syncopal episodes.  ? ?Assessment/Plan:   ? ?1. Falls: Weakness  and multiple falls prior to admission. Suspect too much fluid off with HD and orthostasis. No sustained VT on device interrogation.  Doing better now with higher dry weight.  ?- Continue higher EDW per nephrology.  ?- Continue midodrine 10 tid on HD days, have stopped non-HD midodrine with higher MAP.  ?2. Chronic systolic CHF:  Ischemic cardiomyopathy. Has Medtronic ICD. Low output HF, admitted and Impella 5.5 placed 10/04/20.  HM3 LVAD was placed on 10/13/20. Developed post-op renal failure requiring eventual hemodialysis.  MAP now elevated.  For now, will accept elevated MAP on non-HD days.  LDH stable.  Volume looks okay on exam, managed with HD. NYHA class II.   ?- Continue midodrine 10 mg tid on HD days, stopped midodrine on non-HD days.   ?3. LVAD HM III:  VAD interrogated personally.  He is now off ASA.  ?- Continue warfarin with INR goal 2-2.5.  PharmD to dose warfarin.  ?- Treating with vancomycin for driveline infection. Bloody drainage noted on admit. Suspect trauma from recent fall. ?4. CAD s/p CABG 2002:  Last cath in 6/19 with patent grafts. No chest pain.  ?- Continue Crestor.  ?5. ESRD: Currently dialyzing via tunneled catheter. No low flow events.  ?- Continue HD M-W-F. Nephrology following  ?- With continuous flow LVAD, think AV fistula would be unlikely to mature.   ?6. Atrial flutter/fibrillation: s/p DC-CV 08/21/18.   ?- on coumadin  ?7. 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  ?- Continue gabapentin ?8. DM: SSI  ?9. Anemia: Hgb stable on recent labs.   ?10. Suspected Gastroparesis. Nausea improved on Reglan.  ?11. Left Metatarsal Fractures: X-rays showed left 5th and possibly 4th MT base fxs. Ortho consulted: conservative management ?- plan WBAT in CAM boot ?- will f/u w/ ortho in 2 wks  ?12. Possible driveline infection: Wound culture negative. Has been on vancomycin, transition to doxycycline at discharge to complete 2 wks  treatment.  ?13. Deconditioning: Appealing CIR denial.  If unable to go to CIR, will need SNF stay. Currently unable to climb to his 3rd floor apt.  Still with pain with bearing weight on left foot.  ? ?I reviewed the LVAD parameters from today, and compared the results to the patient's prior recorded data.  No programming changes were made.  The LVAD is functioning within specified parameters.  The patient performs LVAD self-test daily.  LVAD interrogation was negative for any significant power changes, alarms or PI events/speed drops.  LVAD equipment check completed and is  in good working order.  Back-up equipment present.   LVAD education done on emergency procedures and precautions and reviewed exit site care. ? ?Length of Stay: 6 ? ?Loralie Champagne, MD ?11/05/2021, 7:51 AM ? ?VAD Team --- VAD ISSUES ONLY--- ?Pager [phone] (7am - 7am) ? ?Advanced Heart Failure Team  ?Pager [phone] (M-F; 7a - 5p)  ?Please contact [facility] for night-coverage after hours (5p -7a ) and weekends on [email or web address] ? ?

## 2021-11-05 NOTE — Plan of Care (Signed)

## 2021-11-05 NOTE — Progress Notes (Signed)
ANTICOAGULATION CONSULT NOTE ? ?Pharmacy Consult for warfarin ?Indication:  LVAD HM3 ? ?Allergies  ?Allergen Reactions  ? Meclizine Hcl Anaphylaxis and Swelling  ? Ivabradine Nausea Only  ? ? ?Patient Measurements: ?Height: '5\' 10"'$  (177.8 cm) ?Weight: 73.4 kg (161 lb 13.1 oz) ?IBW/kg (Calculated) : 73 ?Heparin Dosing Weight: 71.7 kg ? ?Vital Signs: ?Temp: 98.7 ?F (37.1 ?C) (04/02 0345) ?Temp Source: Oral (04/02 0345) ?BP: 98/79 (04/02 0844) ?Pulse Rate: 89 (04/02 0345) ? ?Labs: ?Recent Labs  ?  11/03/21 ?0050 11/04/21 ?0116 11/05/21 ?2707  ?HGB 10.2* 10.2* 9.8*  ?HCT 30.3* 30.8* 30.5*  ?PLT 150 150 140*  ?LABPROT 23.9* 23.8* 23.4*  ?INR 2.1* 2.1* 2.1*  ?CREATININE 4.62* 3.01* 4.25*  ? ? ? ?Estimated Creatinine Clearance: 19.1 mL/min (A) (by C-G formula based on SCr of 4.25 mg/dL (H)). ? ? ?Medical History: ?Past Medical History:  ?Diagnosis Date  ? AICD (automatic cardioverter/defibrillator) present   ? Single-chamber  implantable cardiac defibrillator - Medtronic  ? Atrial fibrillation ([place])   ? Cataract   ? Mixed form OU  ? CHF (congestive heart failure) ([place])   ? Chronic kidney disease   ? Chronic kidney disease (CKD), stage III (moderate) (HCC)   ? Chronic systolic heart failure ([place])   ? a. Echo 5/13: Mild LVE, mild LVH, EF 10%, anteroseptal, lateral, apical AK, mild MR, mild LAE, moderately reduced RVSF, mild RAE, PASP 60;  b. 07/2014 Echo: EF 20-2%, diff HK, AKI of antsep/apical/mid-apicalinferior, mod reduced RV.  ? Coronary artery disease   ? a. s/p CABG 2002 b. LHC 5/13:  dLM 80%, LAD subtotally occluded, pCFX occluded, pRCA 50%, mid? Occlusion with high take off of the PDA with 70% multiple lesions-not bypassed and supplies collaterals to LAD, LIMA-IM/ramus ok, S-OM ok, S-PLA branches ok. Medical therapy was recommended  ? COVID   ? Diabetic retinopathy ([place])   ? NPDR OD, PDR OS  ? Dyspnea   ? Gout   ? "on daily RX" (01/08/2018)  ? Hypertension   ? Hypertensive retinopathy   ? OU  ? Ischemic cardiomyopathy    ? a. Echo 5/13: Mild LVE, mild LVH, EF 10%, anteroseptal, lateral, apical AK, mild MR, mild LAE, moderately reduced RVSF, mild RAE, PASP 60;  b. 01/2012 s/p MDT [id number] Protecta XT VR AICD;  c. 07/2014 Echo: EF 20-2%, diff HK, AKI of antsep/apical/mid-apicalinferior, mod reduced RV.  ? MRSA (methicillin resistant Staphylococcus aureus)   ? Status post right foot plantar deep infection with MRSA status post  I&D 10/2008  ? Myocardial infarction [facility])   ? "was told I'd had several before heart OR in 2002" (01/08/2018)  ? Noncompliance   ? Peripheral neuropathy   ? Retinopathy, diabetic, background ([place])   ? Syncope   ? Type II diabetes mellitus ([place])   ? requiring insulin   ? Vitreous hemorrhage, left (HCC)   ? and proliferative diabetic retinopathy  ? ? ?Medications:  ?Scheduled:  ? atorvastatin  40 mg Oral Daily  ? Chlorhexidine Gluconate Cloth  6 each Topical Q0600  ? gabapentin  100 mg Oral Daily  ? gabapentin  200 mg Oral QHS  ? insulin aspart  0-5 Units Subcutaneous QHS  ? insulin aspart  0-9 Units Subcutaneous TID WC  ? midodrine  10 mg Oral 3 times per day on Mon Wed Fri  ? pantoprazole  40 mg Oral Daily  ? sodium chloride flush  3 mL Intravenous Q12H  ? warfarin  4.5 mg Oral q1600  ?  Warfarin - Pharmacist Dosing Inpatient   Does not apply [id number]  ? ? ?Assessment: ?71 yom presenting after fall, head CT negative. Was found to have mild displaced fractures at 5h metatarsal. On warfarin PTA for hx LVAD HM3 - LD 3/26'@1900'$ . ? ?INR therapeutic today at 2.1.  LDH stable in the 150s. . ? ?PTA regimen 4.5 mg daily except 6 mg on Tuesday. ? ?Goal of Therapy:  ?INR 2-2.5 ?Monitor platelets by anticoagulation protocol: Yes ?  ?Plan:  ?Warfarin 4.'5mg'$  daily  ?Monitor daily INR ? ?Hildred Laser, PharmD ?Clinical Pharmacist ?**Pharmacist phone directory can now be found on [email or web address] ([email or web address]).  Listed under [place]. ? ? ? ? ?

## 2021-11-06 LAB — CBC
HCT: 29.3 % — ABNORMAL LOW (ref 39.0–52.0)
Hemoglobin: 9.7 g/dL — ABNORMAL LOW (ref 13.0–17.0)
MCH: 29.8 pg (ref 26.0–34.0)
MCHC: 33.1 g/dL (ref 30.0–36.0)
MCV: 90.2 fL (ref 80.0–100.0)
Platelets: 155 10*3/uL (ref 150–400)
RBC: 3.25 MIL/uL — ABNORMAL LOW (ref 4.22–5.81)
RDW: 13.1 % (ref 11.5–15.5)
WBC: 7 10*3/uL (ref 4.0–10.5)
nRBC: 0 % (ref 0.0–0.2)

## 2021-11-06 LAB — BASIC METABOLIC PANEL
Anion gap: 10 (ref 5–15)
BUN: 45 mg/dL — ABNORMAL HIGH (ref 6–20)
CO2: 24 mmol/L (ref 22–32)
Calcium: 9.2 mg/dL (ref 8.9–10.3)
Chloride: 99 mmol/L (ref 98–111)
Creatinine, Ser: 5.02 mg/dL — ABNORMAL HIGH (ref 0.61–1.24)
GFR, Estimated: 12 mL/min — ABNORMAL LOW (ref >60)
Glucose, Bld: 121 mg/dL — ABNORMAL HIGH (ref 70–99)
Potassium: 3.8 mmol/L (ref 3.5–5.1)
Sodium: 133 mmol/L — ABNORMAL LOW (ref 135–145)

## 2021-11-06 LAB — LACTATE DEHYDROGENASE: LDH: 173 U/L (ref 98–192)

## 2021-11-06 LAB — GLUCOSE, CAPILLARY
Glucose-Capillary: 91 mg/dL (ref 70–99)
Glucose-Capillary: 83 mg/dL (ref 70–99)
Glucose-Capillary: 72 mg/dL (ref 70–99)
Glucose-Capillary: 107 mg/dL — ABNORMAL HIGH (ref 70–99)

## 2021-11-06 LAB — MAGNESIUM: Magnesium: 1.8 mg/dL (ref 1.7–2.4)

## 2021-11-06 LAB — PROTIME-INR
INR: 2 — ABNORMAL HIGH (ref 0.8–1.2)
Prothrombin Time: 22.3 seconds — ABNORMAL HIGH (ref 11.4–15.2)

## 2021-11-06 MED ORDER — HEPARIN SODIUM (PORCINE) 1000 UNIT/ML IJ SOLN
INTRAMUSCULAR | Status: AC
  Administered 2021-11-06: 1000 [IU]
  Filled 2021-11-06: qty 4

## 2021-11-06 NOTE — Progress Notes (Signed)
Mobility Specialist: Progress Note ? ? 11/06/21 1702  ?Mobility  ?Activity Ambulated with assistance in hallway  ?Level of Assistance Contact guard assist, steadying assist  ?Assistive Device Front wheel walker  ?LLE Weight Bearing WBAT  ?Distance Ambulated (ft) 150 ft  ?Activity Response Tolerated well  ?$Mobility charge 1 Mobility  ? ?Received pt in chair having no complaints and agreeable to mobility. Asymptomatic throughout ambulation, returned back to chair w/ call bell in reach and all needs met. ? ?Harrell Gave Seylah Wernert ?Mobility Specialist ?Mobility Specialist [place]: [phone] ?Mobility Specialist [place]: [phone] ? ?

## 2021-11-06 NOTE — Progress Notes (Signed)
Physical Therapy Treatment ?Patient Details ?Name: Marcus Johnson. ?MRN: [id number] ?DOB: 1961-03-07 ?Today's Date: 11/06/2021 ? ? ?History of Present Illness Pt is a 61 y.o. M who presents 10/30/2021 with multiple falls; thought to be possible seizure activity vs syncopal episodes. Found to have left MT fxs with left 5th and possible 4th MT base fxs. CT head negative for acute abnoramlity. Significant PMH: chronic systolic CHF/ICM s/p Medtronic single chamber ICD, HM III LVAD, CAD s/p CABG, ESRD on HD. ? ?  ?PT Comments  ? ? Patient progressing well towards PT goals. Reports feeling good to day; glad we came before HD. Session focused on progressive ambulation and standing tasks at sink. Total A to change to batteries; reports his family manages his LVAD at home.  Attempted walking without UE support however pt very unsteady; does better with BUE support and use of RW for ambulation. Pt with drop in BP post walk- Sitting BP 123/93, sitting BP post walk 86/53 (61), asymptomatic. RN made aware.  Pt continues to make progress; will attempt stair training next session. Will follow. ?  ?Recommendations for follow up therapy are one component of a multi-disciplinary discharge planning process, led by the attending physician.  Recommendations may be updated based on patient status, additional functional criteria and insurance authorization. ? ?Follow Up Recommendations ? Acute inpatient rehab (3hours/day) ?  ?  ?Assistance Recommended at Discharge Frequent or constant Supervision/Assistance  ?Patient can return home with the following A little help with walking and/or transfers;A little help with bathing/dressing/bathroom;Assistance with cooking/housework;Assist for transportation;Help with stairs or ramp for entrance ?  ?Equipment Recommendations ? None recommended by PT  ?  ?Recommendations for Other Services   ? ? ?  ?Precautions / Restrictions Precautions ?Precautions: Fall ?Precaution Comments: Orthostatic,  LVAD ?Required Braces or Orthoses: Other Brace ?Other Brace: L CAM boot ?Restrictions ?Weight Bearing Restrictions: Yes ?LLE Weight Bearing: Weight bearing as tolerated  ?  ? ?Mobility ? Bed Mobility ?  ?  ?  ?  ?  ?  ?  ?General bed mobility comments: Sitting EOB upon PT arrival. ?  ? ?Transfers ?Overall transfer level: Needs assistance ?Equipment used: Rolling walker (2 wheels) ?Transfers: Sit to/from Stand ?Sit to Stand: Min guard ?  ?  ?  ?  ?  ?General transfer comment: Min guard for safety. Stood from [employer], required 2 attempts to rise. ?  ? ?Ambulation/Gait ?Ambulation/Gait assistance: Min guard ?Gait Distance (Feet): 200 Feet ?Assistive device: Rolling walker (2 wheels) ?Gait Pattern/deviations: Decreased step length - right, Decreased step length - left, Decreased stride length, Decreased weight shift to left, Trunk flexed, Step-through pattern ?Gait velocity: reduced ?  ?  ?General Gait Details: Slow, mostly steady gait with LLE bumping into RW at times, picking up RW to turn it. 2 standing rest breaks. Attempted walking wihtout UE support however more unsteady so preferred to use RW. ? ? ?Stairs ?  ?  ?  ?  ?  ? ? ?Wheelchair Mobility ?  ? ?Modified Rankin (Stroke Patients Only) ?  ? ? ?  ?Balance Overall balance assessment: Needs assistance ?Sitting-balance support: No upper extremity supported, Feet supported ?Sitting balance-Leahy Scale: Good ?  ?  ?Standing balance support: During functional activity ?Standing balance-Leahy Scale: Fair ?Standing balance comment: Reliant on RW, able to stand at sink and brush teeth with supervision ?  ?  ?  ?  ?  ?  ?  ?  ?  ?  ?  ?  ? ?  ?  Cognition Arousal/Alertness: Awake/alert ?Behavior During Therapy: [facility] for tasks assessed/performed ?Overall Cognitive Status: Within Functional Limits for tasks assessed ?  ?  ?  ?  ?  ?  ?  ?  ?  ?  ?  ?  ?  ?  ?  ?  ?General Comments: pleasant and cooperative patient, distractable. ?  ?  ? ?  ?Exercises   ? ?  ?General Comments  General comments (skin integrity, edema, etc.): Sitting BP 123/93, sitting BP post walk 86/53 (61), asymptomatic, RN made aware. ?  ?  ? ?Pertinent Vitals/Pain Pain Assessment ?Pain Assessment: Faces ?Faces Pain Scale: Hurts little more ?Pain Location: Lft foot in WB ?Pain Descriptors / Indicators: Discomfort, Grimacing ?Pain Intervention(s): Monitored during session, Repositioned  ? ? ?Home Living   ?  ?  ?  ?  ?  ?  ?  ?  ?  ?   ?  ?Prior Function    ?  ?  ?   ? ?PT Goals (current goals can now be found in the care plan section) Progress towards PT goals: Progressing toward goals ? ?  ?Frequency ? ? ? Min 3X/week ? ? ? ?  ?PT Plan Current plan remains appropriate  ? ? ?Co-evaluation   ?  ?  ?  ?  ? ?  ?AM-PAC PT "6 Clicks" Mobility   ?Outcome Measure ? Help needed turning from your back to your side while in a flat bed without using bedrails?: None ?Help needed moving from lying on your back to sitting on the side of a flat bed without using bedrails?: A Little ?Help needed moving to and from a bed to a chair (including a wheelchair)?: A Little ?Help needed standing up from a chair using your arms (e.g., wheelchair or bedside chair)?: A Little ?Help needed to walk in hospital room?: A Little ?Help needed climbing 3-5 steps with a railing? : A Lot ?6 Click Score: 18 ? ?  ?End of Session Equipment Utilized During Treatment: Other (comment) (CAM boot) ?Activity Tolerance: Patient tolerated treatment well;Treatment limited secondary to medical complications (Comment) (drop in BP) ?Patient left: in bed;with call bell/phone within reach (sitting EOB) ?Nurse Communication: Mobility status;Other (comment) (BP drop) ?PT Visit Diagnosis: Unsteadiness on feet (R26.81);Other abnormalities of gait and mobility (R26.89);History of falling (Z91.81);Difficulty in walking, not elsewhere classified (R26.2) ?  ? ? ?Time: 6503-5465 ?PT Time Calculation (min) (ACUTE ONLY): 29 min ? ?Charges:  $Gait Training: 8-22 mins ?$Therapeutic  Activity: 8-22 mins          ?          ? ?Marisa Severin, PT, DPT ?Acute Rehabilitation Services ?Secure chat preferred ?Office [phone] ? ? ? ? ? ?[place] ?11/06/2021, 12:29 PM ? ?

## 2021-11-06 NOTE — Progress Notes (Signed)
OT Cancellation Note ? ?Patient Details ?Name: Marcus Johnson. ?MRN: [id number] ?DOB: Dec 26, 1960 ? ? ?Cancelled Treatment:    Reason Eval/Treat Not Completed: Patient at procedure or test/ unavailable (HD) ? ?[name] ?11/06/2021, 1:16 PM ?

## 2021-11-06 NOTE — Progress Notes (Signed)
Admit: 10/30/2021 ?LOS: 7 ? ?32M ESRD on HD [facility] TDC MWF, +LVAD; s/p fall at home and s/p L metatarsal fracture ? ?Subjective: no c/o's today ? ?04/02 0701 - 04/03 0700 ?In: -  ?Out: 600 [Urine:600] ? ?Filed Weights  ? 11/05/21 0510 11/06/21 0407 11/06/21 1155  ?Weight: 73.4 kg 74.2 kg 74.3 kg  ? ? ?Scheduled Meds: ? atorvastatin  40 mg Oral Daily  ? Chlorhexidine Gluconate Cloth  6 each Topical Q0600  ? gabapentin  100 mg Oral Daily  ? gabapentin  200 mg Oral QHS  ? heparin sodium (porcine)      ? insulin aspart  0-5 Units Subcutaneous QHS  ? insulin aspart  0-9 Units Subcutaneous TID WC  ? midodrine  10 mg Oral 3 times per day on Mon Wed Fri  ? pantoprazole  40 mg Oral Daily  ? sodium chloride flush  3 mL Intravenous Q12H  ? warfarin  4.5 mg Oral q1600  ? Warfarin - Pharmacist Dosing Inpatient   Does not apply [id number]  ? ?Continuous Infusions: ? sodium chloride    ? vancomycin Stopped (11/03/21 1204)  ? ?PRN Meds:.sodium chloride, acetaminophen, naphazoline-glycerin, ondansetron (ZOFRAN) IV, sodium chloride flush, traMADol ? ?Current Labs: reviewed  ? ? ?Physical Exam:  Blood pressure 120/80, pulse 88, temperature 98 ?F (36.7 ?C), temperature source Temporal, resp. rate 19, height '5\' 10"'$  (1.778 m), weight 74.3 kg, SpO2 100 %. ?Cucumber NAD seen in room ?Pleasant positive affect ?Cont hum on chest exam ?Nl WOB ?R IJ TDC c/d/I ?No sig LEE ?Nonfocal CN2-12 intact ? ?OP HD: [facility] MWF ? 4h  400/1.5  71kg  2/2 bath  TDC  Hep none ?- on 3/28 > hep B Ag neg and hep B Ab's were high / protective ?-Mircera 30 mcg IV q 2 weeks (last dose 10/25/2021) ?-Venofer 50 mg IV weekly ?-No VDRA ? ?Assessment/ Plan: ?ESRD - HD MWF [facility] TDC. HD today.  ?Syncope/ malaise post HD - prob needs EDW increased, will plan ^ to 73kg. Weights have been labile. No vol ^ on exam.  ?HFrEF/ sp LVAD - on vanc for ? Driveline infection ?S/p L metatarsal fractures - ortho following, PT/OT; CIR deneid but in process of appealing ?Anemia esrd - hb stable, due for ESA  4/5 ?CMD-BMD-  not on binders, Ca ok, CTM ?DM2 ? ?Kelly Splinter, MD ?11/06/2021, 3:05 PM ? ? ? ?Recent Labs  ?Lab 11/04/21 ?0116 11/05/21 ?8299 11/06/21 ?0111  ?NA 137 140 133*  ?K 3.8 3.9 3.8  ?CL 100 100 99  ?CO2 '29 25 24  '$ ?GLUCOSE 95 87 121*  ?BUN 19 36* 45*  ?CREATININE 3.01* 4.25* 5.02*  ?CALCIUM 9.2 9.7 9.2  ? ? ?Recent Labs  ?Lab 11/04/21 ?0116 11/05/21 ?3716 11/06/21 ?0111  ?WBC 6.9 6.9 7.0  ?HGB 10.2* 9.8* 9.7*  ?HCT 30.8* 30.5* 29.3*  ?MCV 89.5 90.8 90.2  ?PLT 150 140* 155  ? ? ? ? ? ? ? ? ? ? ?  ?

## 2021-11-06 NOTE — Progress Notes (Addendum)
LVAD Coordinator Rounding Note: ? ?HM III LVAD implanted on 10/13/20 by Dr Cyndia Bent under Destination Therapy criteria. ? ?Pt admitted from ED to Dr. Claris Gladden service due to multiple falls and syncope. ? ?Patient sitting up on side of bed talking on phone with brother. Just finished breakfast.  ? ?Patient says he spoke with "Lauren" from CIR Sunday evening about his appeal with his insurance company for admission to CIR. He says his initial request was refused because paperwork did not have patients' signature. He says with deficits in right hand, it is difficult for him to write, but he has "been practicing". He is hoping to hear back from [place] with update on possible acceptance to CIR. He says if he doesn't get in, he will plan on going home and "do whatever I have to to get up and down 3 flights of stairs". ? ?Advised patient to inform PT/OT of his situation at home in case they need to work with him on navigating stairs. Pt agreed to same.  ? ?Pt scheduled for HD today; reports he has been going for sessions before he gets am Midodrine. Asked Kristy [facility] nurse) to give his dose prior to him going for HD, she verbalized agreement to same.  ? ?Vital signs: ?Temp: 97.9 ?HR: 84 ?Doppler:  120 ?Auto cuff:  124/97 (106) ?O2 Sat: 98% on RA ?Wt: 156.5>155.8>160.5>168.6>160.9>159.8>158>161>163.5 lbs   ? ?LVAD interrogation reveals:  ?Speed: 5800 ?Flow: 5.0 ?Power: 4.6w ?PI: 3.5 ?Hematocrit: 31 ? ?Alarms: none ?Events: 1 PI event on 11/05/21 ? ?Fixed speed: 5800 ?Low speed limit: 5500 ? ?Drive Line: Existing VAD dressing removed and site care performed using sterile technique. Drive line exit site cleaned with Chlora prep applicators x 2, allowed to dry, and gauze dressing applied. Exit site healed and incorporated, the velour is fully implanted at exit site. Pt has moderate amount of bloody drainage. Driveline appears to have repeated trauma. Pt admits it may be due to trauma while sleeping. Will add second anchor. No  redness, tenderness , foul odor or rash noted. Drive line anchor x 2 applied. Will advance to MWF dressing changes per [facility] nurse (or as needed to keep dressing C/D/I). Next dressing change due 11/08/21. ? ? ? ?Labs:  ?LDH trend: 177>173>189>171>164>156>173 ? ?INR trend: 1.8>1.8>2.0>1.9>2.1>2.1>2.0 ? ?Anticoagulation Plan: ?-INR Goal: 2.0-2.5 ?-ASA Dose: none ? ?Device:  ?-  Medtronic single ?-  Pacing: VVI 40 ?- Therapies: on 200 bpm ?-  Last device check 10/10/21 ? ?Plan/Recommendations:  ?1. Call VAD coordinator for equipment or drive line issues ?2. MWF dressing changes using 2 anchors. Next dressing due 11/08/20 per BS nurse.  ?3. Will asked BS nurse to order abdominal binder for patient to attempt to decrease drive line trauma.  ? ?Zada Girt, RN ?VAD Coordinator  ?Office: [phone]  ?24/7 Pager: [phone]  ?  ? ? ? ? ? ? ? ?

## 2021-11-06 NOTE — Progress Notes (Signed)
ANTICOAGULATION CONSULT NOTE ? ?Pharmacy Consult for warfarin ?Indication:  LVAD HM3 ? ?Allergies  ?Allergen Reactions  ? Meclizine Hcl Anaphylaxis and Swelling  ? Ivabradine Nausea Only  ? ? ?Patient Measurements: ?Height: '5\' 10"'$  (177.8 cm) ?Weight: 74.2 kg (163 lb 9.3 oz) ?IBW/kg (Calculated) : 73 ?Heparin Dosing Weight: 71.7 kg ? ?Vital Signs: ?Temp: 98.5 ?F (36.9 ?C) (04/03 0407) ?Temp Source: Oral (04/03 0407) ?BP: 101/86 (04/03 0407) ?Pulse Rate: 84 (04/03 0407) ? ?Labs: ?Recent Labs  ?  11/04/21 ?0116 11/05/21 ?6578 11/06/21 ?0111  ?HGB 10.2* 9.8* 9.7*  ?HCT 30.8* 30.5* 29.3*  ?PLT 150 140* 155  ?LABPROT 23.8* 23.4* 22.3*  ?INR 2.1* 2.1* 2.0*  ?CREATININE 3.01* 4.25* 5.02*  ? ? ? ?Estimated Creatinine Clearance: 16.2 mL/min (A) (by C-G formula based on SCr of 5.02 mg/dL (H)). ? ? ?Medical History: ?Past Medical History:  ?Diagnosis Date  ? AICD (automatic cardioverter/defibrillator) present   ? Single-chamber  implantable cardiac defibrillator - Medtronic  ? Atrial fibrillation ([place])   ? Cataract   ? Mixed form OU  ? CHF (congestive heart failure) ([place])   ? Chronic kidney disease   ? Chronic kidney disease (CKD), stage III (moderate) (HCC)   ? Chronic systolic heart failure ([place])   ? a. Echo 5/13: Mild LVE, mild LVH, EF 10%, anteroseptal, lateral, apical AK, mild MR, mild LAE, moderately reduced RVSF, mild RAE, PASP 60;  b. 07/2014 Echo: EF 20-2%, diff HK, AKI of antsep/apical/mid-apicalinferior, mod reduced RV.  ? Coronary artery disease   ? a. s/p CABG 2002 b. LHC 5/13:  dLM 80%, LAD subtotally occluded, pCFX occluded, pRCA 50%, mid? Occlusion with high take off of the PDA with 70% multiple lesions-not bypassed and supplies collaterals to LAD, LIMA-IM/ramus ok, S-OM ok, S-PLA branches ok. Medical therapy was recommended  ? COVID   ? Diabetic retinopathy ([place])   ? NPDR OD, PDR OS  ? Dyspnea   ? Gout   ? "on daily RX" (01/08/2018)  ? Hypertension   ? Hypertensive retinopathy   ? OU  ? Ischemic cardiomyopathy    ? a. Echo 5/13: Mild LVE, mild LVH, EF 10%, anteroseptal, lateral, apical AK, mild MR, mild LAE, moderately reduced RVSF, mild RAE, PASP 60;  b. 01/2012 s/p MDT [id number] Protecta XT VR AICD;  c. 07/2014 Echo: EF 20-2%, diff HK, AKI of antsep/apical/mid-apicalinferior, mod reduced RV.  ? MRSA (methicillin resistant Staphylococcus aureus)   ? Status post right foot plantar deep infection with MRSA status post  I&D 10/2008  ? Myocardial infarction [facility])   ? "was told I'd had several before heart OR in 2002" (01/08/2018)  ? Noncompliance   ? Peripheral neuropathy   ? Retinopathy, diabetic, background ([place])   ? Syncope   ? Type II diabetes mellitus ([place])   ? requiring insulin   ? Vitreous hemorrhage, left (HCC)   ? and proliferative diabetic retinopathy  ? ? ?Medications:  ?Scheduled:  ? atorvastatin  40 mg Oral Daily  ? Chlorhexidine Gluconate Cloth  6 each Topical Q0600  ? gabapentin  100 mg Oral Daily  ? gabapentin  200 mg Oral QHS  ? insulin aspart  0-5 Units Subcutaneous QHS  ? insulin aspart  0-9 Units Subcutaneous TID WC  ? midodrine  10 mg Oral 3 times per day on Mon Wed Fri  ? pantoprazole  40 mg Oral Daily  ? sodium chloride flush  3 mL Intravenous Q12H  ? warfarin  4.5 mg Oral q1600  ?  Warfarin - Pharmacist Dosing Inpatient   Does not apply [id number]  ? ? ?Assessment: ?47 yom presenting after fall, head CT negative. Was found to have mild displaced fractures at 5h metatarsal. On warfarin PTA for hx LVAD HM3. ? ?INR remains therapeutic at 2.0, CBC and LDH stable. ? ?PTA regimen 4.5 mg daily except 6 mg on Tuesday. ? ?Goal of Therapy:  ?INR 2-2.5 ?Monitor platelets by anticoagulation protocol: Yes ?  ?Plan:  ?Warfarin 4.'5mg'$  daily  ?Monitor daily INR ? ?Arrie Senate, PharmD, BCPS, BCCP ?Clinical Pharmacist ?[phone] ?Please check AMION for all [place] numbers ?11/06/2021 ? ? ? ? ?

## 2021-11-06 NOTE — Progress Notes (Addendum)
Patient ID: Marcus Johnson., male   DOB: 1960-10-08, 61 y.o.   MRN: [id number] ?  ?Advanced Heart Failure VAD Team Note ? ?PCP-Cardiologist: None  ?HF Cardiologist: Dr. Aundra Dubin ? ?Subjective:   ? ?Started on empiric vancomycin for suspected DL infection. Wound culture NGTD.  Afebrile.  ? ?MAPs now in 90s, seems to be doing better with increasing EDW. Able to ambulate short distances in room but left foot pain primarily limiting mobility. ? ?Denies dizziness. No CP or dyspnea. ? ?Old blood noted from DL during dressing change. Suspect recurrent trauma.  ? ?LVAD INTERROGATION:  ?HeartMate III LVAD:   ?Flow 5.0 liters/min, speed 5800, power 4.5, PI 3.5.  8 PI events yesterday, 1 so far this am. ? ?Objective:   ? ?Vital Signs:   ?Temp:  [98.2 ?F (36.8 ?C)-98.6 ?F (37 ?C)] 98.5 ?F (36.9 ?C) (04/03 0407) ?Pulse Rate:  [81-87] 84 (04/03 0407) ?Resp:  [15-21] 16 (04/03 0407) ?BP: (98-129)/(79-94) 101/86 (04/03 0407) ?SpO2:  [98 %-100 %] 98 % (04/03 0407) ?Weight:  [74.2 kg] 74.2 kg (04/03 0407) ?Last BM Date : 11/05/21 ?Mean arterial Pressure  80s-100s ? ?Intake/Output:  ? ?Intake/Output Summary (Last 24 hours) at 11/06/2021 0809 ?Last data filed at 11/06/2021 0540 ?Gross per 24 hour  ?Intake --  ?Output 600 ml  ?Net -600 ml  ?  ? ?Physical Exam  ?  ?Physical Exam: ?GENERAL: No distress. Sitting up in bed. ?HEENT: normal  ?NECK: Supple, JVP 8 cm  2+ bilaterally, no bruits.   ?CARDIAC:  Mechanical heart sounds with LVAD hum present.  ?LUNGS:  CTA ?ABDOMEN:  Soft, round, nontender, positive bowel sounds x4.     ?LVAD exit site: well-healed and incorporated.  Dressing dry and intact.  Old blood noted from DL.  Stabilization device present and accurately applied.  Driveline dressing is being changed daily per sterile technique. ?EXTREMITIES:  Warm and dry, no cyanosis, clubbing, rash or edema  ?NEUROLOGIC:  Alert and oriented x 4.  Gait steady.  No aphasia.  No dysarthria.  Affect pleasant.    ? ? ? ?Telemetry  ? ?NSR 80s   ? ?EKG  ?  ?No new EKG  ? ?Labs  ? ?Basic Metabolic Panel: ?Recent Labs  ?Lab 11/02/21 ?0203 11/03/21 ?0050 11/04/21 ?0116 11/05/21 ?4665 11/06/21 ?0111  ?NA 137 138 137 140 133*  ?K 4.0 4.2 3.8 3.9 3.8  ?CL 100 102 100 100 99  ?CO2 '26 26 29 25 24  '$ ?GLUCOSE 79 118* 95 87 121*  ?BUN 23* 38* 19 36* 45*  ?CREATININE 3.45* 4.62* 3.01* 4.25* 5.02*  ?CALCIUM 9.4 9.2 9.2 9.7 9.2  ?MG 1.7 2.3 1.9 1.9 1.8  ? ? ?Liver Function Tests: ?Recent Labs  ?Lab 10/30/21 ?1046  ?AST 28  ?ALT 13  ?ALKPHOS 95  ?BILITOT 1.1  ?PROT 7.9  ?ALBUMIN 3.6  ? ?No results for input(s): LIPASE, AMYLASE in the last 168 hours. ?No results for input(s): AMMONIA in the last 168 hours. ? ?CBC: ?Recent Labs  ?Lab 11/02/21 ?0203 11/03/21 ?0050 11/04/21 ?0116 11/05/21 ?9935 11/06/21 ?0111  ?WBC 6.4 6.8 6.9 6.9 7.0  ?HGB 10.1* 10.2* 10.2* 9.8* 9.7*  ?HCT 31.8* 30.3* 30.8* 30.5* 29.3*  ?MCV 90.9 89.4 89.5 90.8 90.2  ?PLT 147* 150 150 140* 155  ? ? ?INR: ?Recent Labs  ?Lab 11/02/21 ?0203 11/03/21 ?0050 11/04/21 ?0116 11/05/21 ?7017 11/06/21 ?0111  ?INR 1.9* 2.1* 2.1* 2.1* 2.0*  ? ? ?Other results: ?EKG:  ? ? ?Imaging  ? ?  No results found. ? ? ?Medications:   ? ? ?Scheduled Medications: ? atorvastatin  40 mg Oral Daily  ? Chlorhexidine Gluconate Cloth  6 each Topical Q0600  ? gabapentin  100 mg Oral Daily  ? gabapentin  200 mg Oral QHS  ? insulin aspart  0-5 Units Subcutaneous QHS  ? insulin aspart  0-9 Units Subcutaneous TID WC  ? midodrine  10 mg Oral 3 times per day on Mon Wed Fri  ? pantoprazole  40 mg Oral Daily  ? sodium chloride flush  3 mL Intravenous Q12H  ? warfarin  4.5 mg Oral q1600  ? Warfarin - Pharmacist Dosing Inpatient   Does not apply [id number]  ? ? ?Infusions: ? sodium chloride    ? vancomycin Stopped (11/03/21 1204)  ? ? ?PRN Medications: ?sodium chloride, acetaminophen, naphazoline-glycerin, ondansetron (ZOFRAN) IV, sodium chloride flush, traMADol ? ? ?Patient Profile  ? ?61 y.o. male with history of chornic systolic CHF/ICM s/p Medtronic  single chamber ICD, HM III LVAD, CAD s/p CABG, ESRD on HD. Now presenting with multiple falls and ? Seizure activity/syncopal episodes.  ? ?Assessment/Plan:   ? ?1. Falls: Weakness and multiple falls prior to admission. Suspect too much fluid off with HD and orthostasis. No sustained VT on device interrogation.  Doing better now with higher dry weight.  ?- Continue higher EDW per nephrology.  ?- Continue midodrine 10 tid on HD days, have stopped non-HD midodrine with higher MAP.  ?2. Chronic systolic CHF:  Ischemic cardiomyopathy. Has Medtronic ICD. Low output HF, admitted and Impella 5.5 placed 10/04/20.  HM3 LVAD was placed on 10/13/20. Developed post-op renal failure requiring eventual hemodialysis.  MAP now elevated.  For now, will accept elevated MAP on non-HD days.  LDH stable.   ?- NYHA II. Volume looks good on exam, managed with HD. ?- Continue midodrine 10 mg tid on HD days, stopped midodrine on non-HD days.   ?3. LVAD HM III:  VAD interrogated personally.  He is now off ASA.  ?- Continue warfarin with INR goal 2-2.5.  PharmD to dose warfarin. INR 2.0. ?- Treating with vancomycin for driveline infection. Bloody drainage noted on admit. Suspect trauma from recent fall. ?4. CAD s/p CABG 2002:  Last cath in 6/19 with patent grafts. No chest pain.  ?- Continue Crestor.  ?5. ESRD: Currently dialyzing via tunneled catheter. No low flow events.  ?- Continue HD M-W-F. Nephrology following  ?- With continuous flow LVAD, think AV fistula would be unlikely to mature.   ?6. Atrial flutter/fibrillation: s/p DC-CV 08/21/18.   ?- on coumadin ?7. 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  ?- Continue gabapentin ?8. DM: SSI  ?9. Anemia: Hgb stable, 9.7 today. ?10. Suspected Gastroparesis. Nausea improved on Reglan.  ?11. Left Metatarsal Fractures: X-rays showed left 5th and possibly 4th MT base fxs. Ortho consulted: conservative management ?- plan WBAT in  CAM boot ?- will f/u w/ ortho in 2 wks  ?12. Possible driveline infection: Wound culture negative. Has been on vancomycin, transition to doxycycline at discharge to complete 2 wks treatment.  ?13. Deconditioning: Appealing CIR denial.  If unable to go to CIR, will need SNF stay. Currently unable to climb to his 3rd floor apt.  Still with pain with bearing weight on left foot.  ? ?I reviewed the LVAD parameters from today, and compared the results to the patient's prior recorded data.  No programming changes were made.  The LVAD is functioning  within specified parameters.  The patient performs LVAD self-test daily.  LVAD interrogation was negative for any significant power changes, alarms or PI events/speed drops.  LVAD equipment check completed and is in good working order.  Back-up equipment present.   LVAD education done on emergency procedures and precautions and reviewed exit site care. ? ?Length of Stay: 7 ? ?FINCH, LINDSAY N, PA-C ?11/06/2021, 8:09 AM ? ?VAD Team --- VAD ISSUES ONLY--- ?Pager [phone] (7am - 7am) ? ?Advanced Heart Failure Team  ?Pager [phone] (M-F; 7a - 5p)  ?Please contact [facility] for night-coverage after hours (5p -7a ) and weekends on [email or web address] ? ?Patient seen with PA, agree with the above note.  ? ?Not getting orthostatic symptoms now.  Overall stable, still hard to walk on left foot.  ? ?General: Well appearing this am. NAD.  ?HEENT: Normal. ?Neck: Supple, JVP 7-8 cm. Carotids OK.  ?Cardiac:  Mechanical heart sounds with LVAD hum present.  ?Lungs:  CTAB, normal effort.  ?Abdomen:  NT, ND, no HSM. No bruits or masses. +BS  ?LVAD exit site: Well-healed and incorporated. Dressing dry and intact. No erythema or drainage. Stabilization device present and accurately applied. Driveline dressing changed daily per sterile technique. ?Extremities:  Warm and dry. No cyanosis, clubbing, rash, or edema. Left foot in boot.  ?Neuro:  Alert & oriented x 3. Cranial nerves grossly intact. Moves all  4 extremities w/o difficulty. Affect pleasant   ? ?LVAD parameters stable. ? ?Adjusted EDW at HD seems to be appropriate at this point.  No change to midodrine regimen today.  ? ?Waiting on CIR appeal, if tu

## 2021-11-06 NOTE — Progress Notes (Signed)
Inpatient Rehab Admissions Coordinator:  ?Continue to await expedited appeal decision. Will continue to follow. ? ? ?Gayland Curry, MS, CCC-SLP ?Admissions Coordinator ?[phone] ? ?
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
## 2021-11-06 NOTE — Plan of Care (Signed)

## 2021-11-07 ENCOUNTER — Ambulatory Visit: Payer: Medicare HMO | Admitting: Podiatry

## 2021-11-07 LAB — BASIC METABOLIC PANEL
Anion gap: 8 (ref 5–15)
BUN: 23 mg/dL — ABNORMAL HIGH (ref 6–20)
CO2: 27 mmol/L (ref 22–32)
Calcium: 9.1 mg/dL (ref 8.9–10.3)
Chloride: 103 mmol/L (ref 98–111)
Creatinine, Ser: 2.97 mg/dL — ABNORMAL HIGH (ref 0.61–1.24)
GFR, Estimated: 23 mL/min — ABNORMAL LOW (ref >60)
Glucose, Bld: 111 mg/dL — ABNORMAL HIGH (ref 70–99)
Potassium: 3.9 mmol/L (ref 3.5–5.1)
Sodium: 138 mmol/L (ref 135–145)

## 2021-11-07 LAB — CBC
HCT: 29.9 % — ABNORMAL LOW (ref 39.0–52.0)
Hemoglobin: 9.8 g/dL — ABNORMAL LOW (ref 13.0–17.0)
MCH: 29.3 pg (ref 26.0–34.0)
MCHC: 32.8 g/dL (ref 30.0–36.0)
MCV: 89.5 fL (ref 80.0–100.0)
Platelets: 153 10*3/uL (ref 150–400)
RBC: 3.34 MIL/uL — ABNORMAL LOW (ref 4.22–5.81)
RDW: 13.1 % (ref 11.5–15.5)
WBC: 7.2 10*3/uL (ref 4.0–10.5)
nRBC: 0 % (ref 0.0–0.2)

## 2021-11-07 LAB — GLUCOSE, CAPILLARY
Glucose-Capillary: 91 mg/dL (ref 70–99)
Glucose-Capillary: 112 mg/dL — ABNORMAL HIGH (ref 70–99)
Glucose-Capillary: 117 mg/dL — ABNORMAL HIGH (ref 70–99)
Glucose-Capillary: 109 mg/dL — ABNORMAL HIGH (ref 70–99)

## 2021-11-07 LAB — PROTIME-INR
INR: 2 — ABNORMAL HIGH (ref 0.8–1.2)
Prothrombin Time: 22.3 seconds — ABNORMAL HIGH (ref 11.4–15.2)

## 2021-11-07 LAB — MAGNESIUM: Magnesium: 1.7 mg/dL (ref 1.7–2.4)

## 2021-11-07 LAB — LACTATE DEHYDROGENASE: LDH: 197 U/L — ABNORMAL HIGH (ref 98–192)

## 2021-11-07 MED ORDER — DOXYCYCLINE HYCLATE 100 MG PO TABS
100.0000 mg | ORAL_TABLET | Freq: Two times a day (BID) | ORAL | Status: DC
  Administered 2021-11-08: 100 mg via ORAL
  Filled 2021-11-07: qty 1

## 2021-11-07 MED ORDER — MAGNESIUM SULFATE 2 GM/50ML IV SOLN
2.0000 g | Freq: Once | INTRAVENOUS | Status: AC
  Administered 2021-11-07: 2 g via INTRAVENOUS
  Filled 2021-11-07: qty 50

## 2021-11-07 NOTE — Progress Notes (Addendum)
Admit: 10/30/2021 ?LOS: 8 ? ?55M ESRD on HD [facility] TDC MWF, +LVAD; s/p fall at home and s/p L metatarsal fracture ? ?Subjective: no c/o's today ? ?04/03 0701 - 04/04 0700 ?In: -  ?Out: 1000  ? ?Filed Weights  ? 11/06/21 1155 11/06/21 1622 11/07/21 0438  ?Weight: 74.3 kg 73 kg 73.2 kg  ? ? ?Scheduled Meds: ? atorvastatin  40 mg Oral Daily  ? Chlorhexidine Gluconate Cloth  6 each Topical Q0600  ? gabapentin  100 mg Oral Daily  ? gabapentin  200 mg Oral QHS  ? insulin aspart  0-5 Units Subcutaneous QHS  ? insulin aspart  0-9 Units Subcutaneous TID WC  ? midodrine  10 mg Oral 3 times per day on Mon Wed Fri  ? pantoprazole  40 mg Oral Daily  ? sodium chloride flush  3 mL Intravenous Q12H  ? warfarin  4.5 mg Oral q1600  ? Warfarin - Pharmacist Dosing Inpatient   Does not apply Q8250  ? ?Continuous Infusions: ? sodium chloride    ? vancomycin Stopped (11/06/21 1643)  ? ?PRN Meds:.sodium chloride, acetaminophen, naphazoline-glycerin, ondansetron (ZOFRAN) IV, sodium chloride flush, traMADol ? ?Current Labs: reviewed  ? ? ?Physical Exam:  Blood pressure 93/76, pulse 85, temperature 98 ?F (36.7 ?C), temperature source Oral, resp. rate 18, height '5\' 10"'$  (1.778 m), weight 73.2 kg, SpO2 96 %. ?[place] NAD seen in room ?Pleasant positive affect ?Cont hum on chest exam ?Nl WOB ?R IJ TDC c/d/I ?No sig LEE ?Nonfocal CN2-12 intact ? ?OP HD: [facility] MWF ? 4h  400/1.5  71kg  2/2 bath  TDC  Hep none ?- pt weighs w/ controller (0.8kg) and 2 batteries (1kg total) at the OP unit,  total to subtract at OP unit is 1.8kg ?- here in hospital is just wearing the controller so total to subtract is 0.8kg ?- on 3/28 > hep B Ag neg and hep B Ab's were high / protective ?-Mircera 30 mcg IV q 2 weeks (last dose 10/25/2021) ?-Venofer 50 mg IV weekly ?-No VDRA ? ?Assessment/ Plan: ?ESRD - HD MWF [facility] TDC. HD Wednesday.  ?Syncope/ malaise post HD - recurrent issue per the pt, we are raising his dry wt to see if this helps. No vol ^ on exam. Weights here are up to  73kg, (which would be 72.2kg outpatient). MAPs are higher per CHF team and pt feeling better. Will keep this as new dry wt for now.  ?HFrEF/ sp LVAD - on vanc for possible driveline infection ?S/p L metatarsal fractures - ortho following, PT/OT; CIR denied but in process of appealing ?Hypotension - was on tid midodrine, now lowered to '10mg'$  pre HD mwf due to higher MAPs.  ?Anemia esrd - hb stable, due for ESA 4/5 ?CMD-BMD-  not on binders, Ca ok, CTM ?DM2 ? ?Kelly Splinter, MD ?11/07/2021, 9:06 AM ? ? ? ?Recent Labs  ?Lab 11/05/21 ?0370 11/06/21 ?0111 11/07/21 ?0110  ?NA 140 133* 138  ?K 3.9 3.8 3.9  ?CL 100 99 103  ?CO2 '25 24 27  '$ ?GLUCOSE 87 121* 111*  ?BUN 36* 45* 23*  ?CREATININE 4.25* 5.02* 2.97*  ?CALCIUM 9.7 9.2 9.1  ? ? ?Recent Labs  ?Lab 11/05/21 ?4888 11/06/21 ?0111 11/07/21 ?0110  ?WBC 6.9 7.0 7.2  ?HGB 9.8* 9.7* 9.8*  ?HCT 30.5* 29.3* 29.9*  ?MCV 90.8 90.2 89.5  ?PLT 140* 155 153  ? ? ? ? ? ? ? ? ? ? ?  ?

## 2021-11-07 NOTE — TOC Progression Note (Addendum)
Transition of Care (TOC) - Progression Note  ? ? ?Patient Details  ?Name: Marcus Johnson. ?MRN: [id number] ?Date of Birth: Jul 09, 1961 ? ?Transition of Care (TOC) CM/SW Contact  ?Adena Sima, LCSW ?Phone Number: ?11/07/2021, 3:20 PM ? ?Clinical Narrative:    ?HF CSW spoke with [name] at bedside about his options due to his insurance denying the CIR appeal. HF CSW discussed about rehab at a SNF however most SNF's are not comfortable with the LVAD but the CSW is happy to attempt to try and find a SNF for him. [name] reported that he isn't comfortable going to a SNF that isn't able to handle his LVAD and is concerned about the insurance denying rehab at the SNF as well. [name] reported that he would like to work with PT and try to navigate stairs in order to get back home and is agreeable to do what Dr. Aundra Dubin ultimately wants. [name] reports he and his family can take care of him at home if he needs to go home. HF CSW reached out to the treatment team to provide and update and also the HF RNCM, Alesia to help with home health plans. ? ?CSW will continue to follow through discharge. ? ?Expected Discharge Plan: [place] ?Barriers to Discharge: Continued Medical Work up ? ?Expected Discharge Plan and Services ?Expected Discharge Plan: [place] ?  ?Discharge Planning Services: CM Consult ?  ?Living arrangements for the past 2 months: Apartment ?                ?  ?  ?  ?  ?  ?  ?  ?  ?  ?  ? ? ?Social Determinants of Health (SDOH) Interventions ?  ? ?Readmission Risk Interventions ? ?  11/14/2020  ? 11:28 AM  ?Readmission Risk Prevention Plan  ?Transportation Screening Complete  ?Medication Review Press photographer) Complete  ?PCP or Specialist appointment within 3-5 days of discharge Complete  ?[place] or Home Care Consult Complete  ?SW Recovery Care/Counseling Consult Complete  ?Palliative Care Screening Not Applicable  ?[place] Not Applicable   ? ? ?[place], MSW, LCSW ?[phone] ?Heart Failure Social Worker  ? ?

## 2021-11-07 NOTE — Progress Notes (Signed)
ANTICOAGULATION CONSULT NOTE ? ?Pharmacy Consult for warfarin ?Indication:  LVAD HM3 ? ?Allergies  ?Allergen Reactions  ? Meclizine Hcl Anaphylaxis and Swelling  ? Ivabradine Nausea Only  ? ? ?Patient Measurements: ?Height: '5\' 10"'$  (177.8 cm) ?Weight: 73.2 kg (161 lb 6 oz) ?IBW/kg (Calculated) : 73 ?Heparin Dosing Weight: 71.7 kg ? ?Vital Signs: ?Temp: 98 ?F (36.7 ?C) (04/04 0749) ?Temp Source: Oral (04/04 0749) ?BP: 93/76 (04/04 0438) ?Pulse Rate: 85 (04/04 0438) ? ?Labs: ?Recent Labs  ?  11/05/21 ?9528 11/06/21 ?0111 11/07/21 ?0110  ?HGB 9.8* 9.7* 9.8*  ?HCT 30.5* 29.3* 29.9*  ?PLT 140* 155 153  ?LABPROT 23.4* 22.3* 22.3*  ?INR 2.1* 2.0* 2.0*  ?CREATININE 4.25* 5.02* 2.97*  ? ? ? ?Estimated Creatinine Clearance: 27.3 mL/min (A) (by C-G formula based on SCr of 2.97 mg/dL (H)). ? ? ?Medical History: ?Past Medical History:  ?Diagnosis Date  ? AICD (automatic cardioverter/defibrillator) present   ? Single-chamber  implantable cardiac defibrillator - Medtronic  ? Atrial fibrillation ([place])   ? Cataract   ? Mixed form OU  ? CHF (congestive heart failure) ([place])   ? Chronic kidney disease   ? Chronic kidney disease (CKD), stage III (moderate) (HCC)   ? Chronic systolic heart failure ([place])   ? a. Echo 5/13: Mild LVE, mild LVH, EF 10%, anteroseptal, lateral, apical AK, mild MR, mild LAE, moderately reduced RVSF, mild RAE, PASP 60;  b. 07/2014 Echo: EF 20-2%, diff HK, AKI of antsep/apical/mid-apicalinferior, mod reduced RV.  ? Coronary artery disease   ? a. s/p CABG 2002 b. LHC 5/13:  dLM 80%, LAD subtotally occluded, pCFX occluded, pRCA 50%, mid? Occlusion with high take off of the PDA with 70% multiple lesions-not bypassed and supplies collaterals to LAD, LIMA-IM/ramus ok, S-OM ok, S-PLA branches ok. Medical therapy was recommended  ? COVID   ? Diabetic retinopathy ([place])   ? NPDR OD, PDR OS  ? Dyspnea   ? Gout   ? "on daily RX" (01/08/2018)  ? Hypertension   ? Hypertensive retinopathy   ? OU  ? Ischemic cardiomyopathy   ? a.  Echo 5/13: Mild LVE, mild LVH, EF 10%, anteroseptal, lateral, apical AK, mild MR, mild LAE, moderately reduced RVSF, mild RAE, PASP 60;  b. 01/2012 s/p MDT [id number] Protecta XT VR AICD;  c. 07/2014 Echo: EF 20-2%, diff HK, AKI of antsep/apical/mid-apicalinferior, mod reduced RV.  ? MRSA (methicillin resistant Staphylococcus aureus)   ? Status post right foot plantar deep infection with MRSA status post  I&D 10/2008  ? Myocardial infarction [facility])   ? "was told I'd had several before heart OR in 2002" (01/08/2018)  ? Noncompliance   ? Peripheral neuropathy   ? Retinopathy, diabetic, background ([place])   ? Syncope   ? Type II diabetes mellitus ([place])   ? requiring insulin   ? Vitreous hemorrhage, left (HCC)   ? and proliferative diabetic retinopathy  ? ? ?Medications:  ?Scheduled:  ? atorvastatin  40 mg Oral Daily  ? Chlorhexidine Gluconate Cloth  6 each Topical Q0600  ? [START ON 11/08/2021] doxycycline  100 mg Oral Q12H  ? gabapentin  100 mg Oral Daily  ? gabapentin  200 mg Oral QHS  ? insulin aspart  0-5 Units Subcutaneous QHS  ? insulin aspart  0-9 Units Subcutaneous TID WC  ? midodrine  10 mg Oral 3 times per day on Mon Wed Fri  ? pantoprazole  40 mg Oral Daily  ? sodium chloride flush  3 mL Intravenous  Q12H  ? warfarin  4.5 mg Oral q1600  ? Warfarin - Pharmacist Dosing Inpatient   Does not apply [id number]  ? ? ?Assessment: ?54 yom presenting after fall, head CT negative. Was found to have mild displaced fractures at 5h metatarsal. On warfarin PTA for hx LVAD HM3. ? ?INR remains therapeutic today at 2.0, CBC and LDH stable. ? ?PTA regimen 4.5 mg daily except 6 mg on Tuesday. ? ?Goal of Therapy:  ?INR 2-2.5 ?Monitor platelets by anticoagulation protocol: Yes ?  ?Plan:  ?Warfarin 4.'5mg'$  daily for now ?Monitor daily INR ? ?Arrie Senate, PharmD, BCPS, BCCP ?Clinical Pharmacist ?[phone] ?Please check AMION for all [place] numbers ?11/07/2021 ? ? ? ? ?

## 2021-11-07 NOTE — Progress Notes (Signed)
Occupational Therapy Treatment ?Patient Details ?Name: Marcus Johnson. ?MRN: [id number] ?DOB: [birth year] ?Today's Date: 11/07/2021 ? ? ?History of present illness Pt is a 61 y.o. M who presents 10/30/2021 with multiple falls; thought to be possible seizure activity vs syncopal episodes. Found to have left MT fxs with left 5th and possible 4th MT base fxs. CT head negative for acute abnoramlity. Significant PMH: chronic systolic CHF/ICM s/p Medtronic single chamber ICD, HM III LVAD, CAD s/p CABG, ESRD on HD. ?  ?OT comments ? Pt very motivated to work with therapy today, improved ability to assist with power change over (lots of potential for him to do this independently and NOT rely on his brother), max A to don L cam boot, max A in standing to don abdominal binder to assist with protection of drive line site. Pt min guard assist with RW for transfers and in room mobility to sink for grooming at the sink. Pt requires standing rest breaks due to L foot (reports improving) and continues to demonstrate improvements. However, Pt continues to require skilled OT at the acute level as well as afterwards at the comprehensive inpatient rehab level to continue to address deficits in balance, stamina, fine motor control, LB dressing, and activity tolerance. OT will continue to follow.   ? ?Recommendations for follow up therapy are one component of a multi-disciplinary discharge planning process, led by the attending physician.  Recommendations may be updated based on patient status, additional functional criteria and insurance authorization. ?   ?Follow Up Recommendations ? Acute inpatient rehab (3hours/day)  ?  ?Assistance Recommended at Discharge Intermittent Supervision/Assistance  ?Patient can return home with the following ? A little help with walking and/or transfers;A lot of help with bathing/dressing/bathroom;Assistance with cooking/housework;Assist for transportation;Help with stairs or ramp for entrance ?   ?Equipment Recommendations ? Other (comment) (defer to next venue of care)  ?  ?Recommendations for Other Services Rehab consult;PT consult ? ?  ?Precautions / Restrictions Precautions ?Precautions: Fall ?Precaution Comments: LVAD ?Required Braces or Orthoses: Other Brace ?Other Brace: L CAM boot ?Restrictions ?Weight Bearing Restrictions: Yes ?LLE Weight Bearing: Weight bearing as tolerated  ? ? ?  ? ?Mobility Bed Mobility ?  ?  ?  ?  ?  ?  ?  ?General bed mobility comments: Sitting EOB upon OT arrival. ?  ? ?Transfers ?Overall transfer level: Needs assistance ?Equipment used: Rolling walker (2 wheels) ?Transfers: Sit to/from Stand ?Sit to Stand: Min guard, Min assist ?  ?  ?  ?  ?  ?General transfer comment: Min guard for safety. vc for safe hand placement. Stood from [employer], recliner x4, from bed required 2 attempts ?  ?  ?Balance Overall balance assessment: Needs assistance ?Sitting-balance support: No upper extremity supported, Feet supported ?Sitting balance-Leahy Scale: Good ?  ?  ?Standing balance support: During functional activity ?Standing balance-Leahy Scale: Fair ?Standing balance comment: Reliant on RW, able to stand at sink and brush teeth with mihn guard ?  ?  ?  ?  ?  ?  ?  ?  ?  ?  ?  ?   ? ?ADL either performed or assessed with clinical judgement  ? ?ADL Overall ADL's : Needs assistance/impaired ?  ?  ?Grooming: Min guard;Standing;Oral care;Wash/dry hands;Wash/dry face ?Grooming Details (indicate cue type and reason): decreased standing tolerance, requires sitting rest breaks ?  ?  ?  ?  ?Upper Body Dressing : Maximal assistance;Standing ?Upper Body Dressing Details (indicate cue type and reason):  to don abdominal binder ?Lower Body Dressing: Maximal assistance ?Lower Body Dressing Details (indicate cue type and reason): to don cam boot ?Toilet Transfer: Minimal assistance;Ambulation;Rolling walker (2 wheels) ?  ?  ?  ?  ?  ?Functional mobility during ADLs: Minimal assistance;Rolling walker (2  wheels);Min guard ?General ADL Comments: decreased ability to perform ADL in standing, frequent falls, neuropathy in R (dominant) hand, ?  ? ?Extremity/Trunk Assessment Upper Extremity Assessment ?Upper Extremity Assessment: RUE deficits/detail ?RUE Deficits / Details: baseline deficits in L hand, strength generally 4/5, decreased grasp and fine motor (baseline) ?RUE Sensation: history of peripheral neuropathy ?RUE Coordination: decreased fine motor ?  ?Lower Extremity Assessment ?Lower Extremity Assessment: LLE deficits/detail ?LLE Deficits / Details: in CAM boot ?  ?  ?  ? ?Vision   ?  ?  ?Perception   ?  ?Praxis   ?  ? ?Cognition Arousal/Alertness: Awake/alert ?Behavior During Therapy: [facility] for tasks assessed/performed ?Overall Cognitive Status: Within Functional Limits for tasks assessed ?  ?  ?  ?  ?  ?  ?  ?  ?  ?  ?  ?  ?  ?  ?  ?  ?General Comments: pleasant and cooperative patient, distractable. ?  ?  ?   ?Exercises   ? ?  ?Shoulder Instructions   ? ? ?  ?General Comments    ? ? ?Pertinent Vitals/ Pain       Pain Assessment ?Pain Assessment: Faces ?Faces Pain Scale: Hurts little more ?Pain Location: Lft foot in WB ?Pain Descriptors / Indicators: Discomfort, Grimacing ?Pain Intervention(s): Limited activity within patient's tolerance, Monitored during session, Repositioned ? ?Home Living   ?  ?  ?  ?  ?  ?  ?  ?  ?  ?  ?  ?  ?  ?  ?  ?  ?  ?  ? ?  ?Prior Functioning/Environment    ?  ?  ?  ?   ? ?Frequency ? Min 2X/week  ? ? ? ? ?  ?Progress Toward Goals ? ?OT Goals(current goals can now be found in the care plan section) ? Progress towards OT goals: Progressing toward goals ? ?Acute Rehab OT Goals ?Patient Stated Goal: get strong enough to get up steps ?OT Goal Formulation: With patient/family ?Time For Goal Achievement: 11/14/21 ?Potential to Achieve Goals: Good  ?Plan Discharge plan remains appropriate;Frequency remains appropriate   ? ?Co-evaluation ? ? ?   ?  ?  ?  ?  ? ?  ?AM-PAC OT "6 Clicks" Daily  Activity     ?Outcome Measure ? ? Help from another person eating meals?: None ?Help from another person taking care of personal grooming?: A Little ?Help from another person toileting, which includes using toliet, bedpan, or urinal?: A Little ?Help from another person bathing (including washing, rinsing, drying)?: A Little ?Help from another person to put on and taking off regular upper body clothing?: A Lot ?Help from another person to put on and taking off regular lower body clothing?: A Lot ?6 Click Score: 17 ? ?  ?End of Session Equipment Utilized During Treatment: Rolling walker (2 wheels);Other (comment) (L Cam Boot) ? ?OT Visit Diagnosis: Unsteadiness on feet (R26.81);Repeated falls (R29.6);History of falling (Z91.81);Muscle weakness (generalized) (M62.81);Pain ?Pain - Right/Left: Left ?Pain - part of body: Ankle and joints of foot ?  ?Activity Tolerance Patient tolerated treatment well ?  ?Patient Left in chair;with call bell/phone within reach;with chair alarm set;with nursing/sitter in room ?  ?  Nurse Communication Mobility status;Precautions ?  ? ?   ? ?Time: 4718-5501 ?OT Time Calculation (min): 38 min ? ?Charges: OT General Charges ?$OT Visit: 1 Visit ?OT Treatments ?$Self Care/Home Management : 23-37 mins ?$Therapeutic Activity: 8-22 mins ? ?Jesse Sans OTR/L ?Acute Rehabilitation Services ?Pager: [phone] ?Office: [phone] ? ? ?Merri Ray Journe [facility] ?11/07/2021, 11:49 AM ?

## 2021-11-07 NOTE — Progress Notes (Addendum)
Patient ID: Marcus Johnson., male   DOB: 1960/12/16, 61 y.o.   MRN: [id number] ?  ?Advanced Heart Failure VAD Team Note ? ?PCP-Cardiologist: None  ?HF Cardiologist: Dr. Aundra Dubin ? ?Subjective:   ? ?Started on empiric vancomycin for suspected DL infection. Wound culture NGTD.  Afebrile.  ? ?Improved w/ adjusted EDW at HD. Less orthostatic symptoms. MAPs in 80s.  ? ? ?INR 2.0  ? ?Mg 1.7  ?K 3.9  ? ?Waiting on CIR appeal.  ? ?Feeling better. Eating breakfast. No dyspnea. Foot pain improving.  ? ?LVAD INTERROGATION:  ?HeartMate III LVAD:   ?Flow 5.0 liters/min, speed 5800, power 4.7, PI 3.6.  1 PI event yesterday ?Objective:   ? ?Vital Signs:   ?Temp:  [97.8 ?F (36.6 ?C)-98.6 ?F (37 ?C)] 98.6 ?F (37 ?C) (04/04 7989) ?Pulse Rate:  [81-116] 85 (04/04 0438) ?Resp:  [16-28] 18 (04/04 0438) ?BP: (92-124)/(54-97) 93/76 (04/04 2119) ?SpO2:  [95 %-100 %] 97 % (04/04 0438) ?Weight:  [73 kg-74.3 kg] 73.2 kg (04/04 0438) ?Last BM Date : 11/06/21 ?Mean arterial Pressure  80s-100s ? ?Intake/Output:  ? ?Intake/Output Summary (Last 24 hours) at 11/07/2021 0713 ?Last data filed at 11/07/2021 0539 ?Gross per 24 hour  ?Intake --  ?Output 1000 ml  ?Net -1000 ml  ?  ? ?Physical Exam  ?  ?Physical Exam: ?GENERAL: No distress. Sitting up in bed. ?HEENT: normal  ?NECK: Supple, JVD 8 cm  2+ bilaterally, no bruits.   ?CARDIAC:  Mechanical heart sounds with LVAD hum present.  ?LUNGS:  CTA. No wheezing  ?ABDOMEN:  Soft, round, nontender, positive bowel sounds x4.     ?LVAD exit site: well-healed and incorporated.  Dressing dry and intact.  Old blood noted from DL.  Stabilization device present and accurately applied.  Driveline dressing is being changed daily per sterile technique. ?EXTREMITIES:  Warm and dry, no cyanosis, clubbing, rash or edema Lt LE in boot  ?NEUROLOGIC:  Alert and oriented x 4.  Gait steady.  No aphasia.  No dysarthria.  Affect pleasant.    ? ? ?Telemetry  ? ?NSR 80s  ? ?EKG  ?  ?No new EKG to review  ? ?Labs  ? ?Basic  Metabolic Panel: ?Recent Labs  ?Lab 11/03/21 ?0050 11/04/21 ?0116 11/05/21 ?4174 11/06/21 ?0111 11/07/21 ?0110  ?NA 138 137 140 133* 138  ?K 4.2 3.8 3.9 3.8 3.9  ?CL 102 100 100 99 103  ?CO2 '26 29 25 24 27  '$ ?GLUCOSE 118* 95 87 121* 111*  ?BUN 38* 19 36* 45* 23*  ?CREATININE 4.62* 3.01* 4.25* 5.02* 2.97*  ?CALCIUM 9.2 9.2 9.7 9.2 9.1  ?MG 2.3 1.9 1.9 1.8 1.7  ? ? ?Liver Function Tests: ?No results for input(s): AST, ALT, ALKPHOS, BILITOT, PROT, ALBUMIN in the last 168 hours. ? ?No results for input(s): LIPASE, AMYLASE in the last 168 hours. ?No results for input(s): AMMONIA in the last 168 hours. ? ?CBC: ?Recent Labs  ?Lab 11/03/21 ?0050 11/04/21 ?0116 11/05/21 ?0814 11/06/21 ?0111 11/07/21 ?0110  ?WBC 6.8 6.9 6.9 7.0 7.2  ?HGB 10.2* 10.2* 9.8* 9.7* 9.8*  ?HCT 30.3* 30.8* 30.5* 29.3* 29.9*  ?MCV 89.4 89.5 90.8 90.2 89.5  ?PLT 150 150 140* 155 153  ? ? ?INR: ?Recent Labs  ?Lab 11/03/21 ?0050 11/04/21 ?0116 11/05/21 ?4818 11/06/21 ?0111 11/07/21 ?0110  ?INR 2.1* 2.1* 2.1* 2.0* 2.0*  ? ? ?Other results: ?EKG:  ? ? ?Imaging  ? ?No results found. ? ? ?Medications:   ? ? ?Scheduled  Medications: ? atorvastatin  40 mg Oral Daily  ? Chlorhexidine Gluconate Cloth  6 each Topical Q0600  ? gabapentin  100 mg Oral Daily  ? gabapentin  200 mg Oral QHS  ? insulin aspart  0-5 Units Subcutaneous QHS  ? insulin aspart  0-9 Units Subcutaneous TID WC  ? midodrine  10 mg Oral 3 times per day on Mon Wed Fri  ? pantoprazole  40 mg Oral Daily  ? sodium chloride flush  3 mL Intravenous Q12H  ? warfarin  4.5 mg Oral q1600  ? Warfarin - Pharmacist Dosing Inpatient   Does not apply [id number]  ? ? ?Infusions: ? sodium chloride    ? vancomycin Stopped (11/06/21 1643)  ? ? ?PRN Medications: ?sodium chloride, acetaminophen, naphazoline-glycerin, ondansetron (ZOFRAN) IV, sodium chloride flush, traMADol ? ? ?Patient Profile  ? ?61 y.o. male with history of chornic systolic CHF/ICM s/p Medtronic single chamber ICD, HM III LVAD, CAD s/p CABG, ESRD on HD.  Now presenting with multiple falls and ? Seizure activity/syncopal episodes.  ? ?Assessment/Plan:   ? ?1. Falls: Weakness and multiple falls prior to admission. Suspect too much fluid off with HD and orthostasis. No sustained VT on device interrogation.  Doing better now with higher dry weight.  ?- Continue higher EDW per nephrology.  ?- Continue midodrine 10 tid on HD days, have stopped non-HD midodrine with higher MAP.  ?2. Chronic systolic CHF:  Ischemic cardiomyopathy. Has Medtronic ICD. Low output HF, admitted and Impella 5.5 placed 10/04/20.  HM3 LVAD was placed on 10/13/20. Developed post-op renal failure requiring eventual hemodialysis.  MAP now elevated.  For now, will accept elevated MAP on non-HD days.  LDH stable.   ?- NYHA II. Volume looks good on exam, managed with HD. ?- Continue midodrine 10 mg tid on HD days, stopped midodrine on non-HD days.   ?3. LVAD HM III:  VAD interrogated personally.  He is now off ASA.  ?- Continue warfarin with INR goal 2-2.5.  PharmD to dose warfarin. INR 2.0. ?- Treating with vancomycin for driveline infection. Bloody drainage noted on admit. Suspect trauma from recent fall. ?4. CAD s/p CABG 2002:  Last cath in 6/19 with patent grafts. No chest pain.  ?- Continue Crestor.  ?5. ESRD: Currently dialyzing via tunneled catheter. No low flow events.  ?- Continue HD M-W-F. Nephrology following  ?- With continuous flow LVAD, think AV fistula would be unlikely to mature.   ?6. Atrial flutter/fibrillation: s/p DC-CV 08/21/18.   ?- on coumadin ?7. 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  ?- Continue gabapentin ?8. DM: SSI  ?9. Anemia: Hgb stable, 9.7 today. ?10. Suspected Gastroparesis. Nausea improved on Reglan.  ?11. Left Metatarsal Fractures: X-rays showed left 5th and possibly 4th MT base fxs. Ortho consulted: conservative management ?- plan WBAT in CAM boot ?- will f/u w/ ortho in 2 wks  ?12. Possible  driveline infection: Wound culture negative. Has been on vancomycin, transition to doxycycline at discharge to complete 2 wks treatment.  ?13. Deconditioning: Appealing CIR denial.  If unable to go to CIR, will need SNF stay. Currently unable to climb to his 3rd floor apt.  Still with pain with bearing weight on left foot.  ?14. Hypomagnesemia: Mg 1.7 ?- gentle supp  ? ?I reviewed the LVAD parameters from today, and compared the results to the patient's prior recorded data.  No programming changes were made.  The LVAD is functioning within specified parameters.  The patient performs LVAD self-test daily.  LVAD interrogation was negative for any significant power changes, alarms or PI events/speed drops.  LVAD equipment check completed and is in good working order.  Back-up equipment present.   LVAD education done on emergency procedures and precautions and reviewed exit site care. ? ?Length of Stay: 8 ? ?Lyda Jester, PA-C ?11/07/2021, 7:13 AM ? ?VAD Team --- VAD ISSUES ONLY--- ?Pager [phone] (7am - 7am) ? ?Advanced Heart Failure Team  ?Pager [phone] (M-F; 7a - 5p)  ?Please contact [facility] for night-coverage after hours (5p -7a ) and weekends on [email or web address] ? ?Patient seen with PA, agree with the above note. ? ?Feeling better now after HD with higher EDW.  No lightheadedness yesterday.  Was able to walk some in hall.  ? ?General: Well appearing this am. NAD.  ?HEENT: Normal. ?Neck: Supple, JVP 8 cm. Carotids OK.  ?Cardiac:  Mechanical heart sounds with LVAD hum present.  ?Lungs:  CTAB, normal effort.  ?Abdomen:  NT, ND, no HSM. No bruits or masses. +BS  ?LVAD exit site: Well-healed and incorporated. Dressing dry and intact. No erythema or drainage. Stabilization device present and accurately applied. Driveline dressing changed daily per sterile technique. ?Extremities:  Warm and dry. No cyanosis, clubbing, rash, or edema. Left foot in boot.  ?Neuro:  Alert & oriented x 3. Cranial nerves grossly intact.  Moves all 4 extremities w/o difficulty. Affect pleasant   ? ?Still with limited mobility due to foot fracture but lightheadedness has resolved.  Able to walk a little in hall.  ? ?LVAD parameters stable.   ? ?Ready

## 2021-11-07 NOTE — TOC CM/SW Note (Signed)
HF TOC CM spoke to pt and he is declining HH at this time.  States his son and brother will help him maneuver the stairs at dialysis. Attending updated.  Jonnie Finner RN3 CCM, Heart Failure TOC CM [phone]  ?

## 2021-11-07 NOTE — TOC CM/SW Note (Addendum)
HF TOC CM contacted IP rehab rep to discuss [facility] Medicare appeal for CIR. Appeal has been started for IP rehab with [facility]. Jonnie Finner RN3 CCM, Heart Failure TOC CM [phone]  ? ?[place], [place] x [phone] Direct Fax # [phone] She requested updated MD notes, PT notes, etc from the last 72 h. CIP coordinator aware.  ?

## 2021-11-07 NOTE — Progress Notes (Addendum)
Inpatient Rehabilitation Admissions Coordinator  ? ?Insurance appeal has begun 3/31 with all forms needed for appeal sent 11/06/21. I contacted [employer] appeals and the case is under review for appeal. ? ?Danne Baxter, RN, MSN ?Rehab Admissions Coordinator ?([phone] ?11/07/2021 10:59 AM ? ?I have faxed additional information as requested by appeals at [facility] today. ? ?Danne Baxter, RN, MSN ?Rehab Admissions Coordinator ?([phone] ?11/07/2021 11:45 AM ? ? ?

## 2021-11-07 NOTE — Progress Notes (Signed)
Mobility Specialist: Progress Note ? ? 11/07/21 1038  ?Mobility  ?Activity Ambulated with assistance in hallway  ?Level of Assistance Contact guard assist, steadying assist  ?Assistive Device Front wheel walker  ?LLE Weight Bearing WBAT  ?Distance Ambulated (ft) 400 ft  ?Activity Response Tolerated well  ?$Mobility charge 1 Mobility  ? ?Received pt in bed having no complaints and agreeable to mobility. Asymptomatic throughout ambulation, returned to room standing at the sink with OT present in the room. Pt requesting to perform stair training with PT.  ? ?Marcus Johnson ?Mobility Specialist ?Mobility Specialist [place]: [phone] ?Mobility Specialist [place]: [phone] ? ?

## 2021-11-07 NOTE — Progress Notes (Signed)
Inpatient Rehabilitation Admissions Coordinator  ? ?[employer] Medicare appeals has upheld denial for Cir admit. I met with patient at bedside and he is aware. He continues to request practicing stairs with therapy and is asking how long can he remain admitted before discharge home. I have alerted acute team and TOC. We will sign off at this time. ? ?Danne Baxter, RN, MSN ?Rehab Admissions Coordinator ?([phone] ?11/07/2021 2:56 PM ? ?

## 2021-11-07 NOTE — Progress Notes (Signed)
LVAD Coordinator Rounding Note: ? ?HM III LVAD implanted on 10/13/20 by Dr Cyndia Bent under Destination Therapy criteria. ? ?Pt admitted from ED to Dr. Claris Gladden service due to multiple falls and syncope. ? ?Patient sitting up on side of bed talking on phone with brother. Just finished breakfast.  ? ?Patient says he is feeling better today as he usually does day after dialysis treatment. Patient expressed feelings about long term dialysis and frustrations with feeling so weak and tired during and after treatments. He feels that since his dry weight has been increased, he does feel "a little better" than prior to this admission.  ? ?His insurance Personnel officer) is reviewing his appeal for IP rehab admission. Per patient, the paperwork has been completed and faxed to appropriate #. [facility] nurse) says [facility] appeals nurse called [place] nursing station this am and was transferred to [facility] with CIR.  ? ?Discussed needs for first floor living with patient. He says he and his family have been unable to find anything available, but would be willing to look outside of [place] area if necessary. Per Raquel Sarna, LCSW gave patient information on available rental properties and site on how to continue looking. Pt very appreciative of same.  ? ? ?Vital signs: ?Temp: 97.8 ?HR: 89 ?Doppler:  78 ?Auto cuff:  not documented ?O2 Sat: 96% on RA ?Wt: 156.5>155.8>160.5>168.6>160.9>159.8>158>161>163.5>161.3 lbs   ? ? ?LVAD interrogation reveals:  ?Speed: 5800 ?Flow: 5.4 ?Power: 4.7w ?PI: 3.1 ?Hematocrit: 31 ? ?Alarms: none ?Events: 20 PI events on 11/06/21 ? ?Fixed speed: 5800 ?Low speed limit: 5500 ? ?Drive Line: Existing VAD dressing C/D/I with anchors x 2 intact and accurately applied. Patent wearing 4 panel abdominal binder and is too large (46 - 62) and "falling off" per PT. Replaced with breast binder provided by [facility] nurse). Continue MWF dressing changes per BS nurse (or as needed to keep dressing C/D/I). Next dressing  change due 11/08/21. ? ? ?Labs:  ?LDH trend: 177>173>189>171>164>156>173>197 ? ?INR trend: 1.8>1.8>2.0>1.9>2.1>2.1>2.0>2.0 ? ?Anticoagulation Plan: ?-INR Goal: 2.0-2.5 ?-ASA Dose: none ? ?Device:  ?-  Medtronic single ?-  Pacing: VVI 40 ?- Therapies: on 200 bpm ?-  Last device check 10/10/21 ? ?Plan/Recommendations:  ?1. Call VAD coordinator for equipment or drive line issues ?2. MWF dressing changes using 2 anchors and abdominal binder. Next dressing due 11/08/20 per BS nurse.  ? ? ?Zada Girt, RN ?VAD Coordinator  ?Office: [phone]  ?24/7 Pager: [phone]  ?  ? ? ? ? ? ? ? ?

## 2021-11-07 NOTE — Progress Notes (Signed)
Physical Therapy Treatment ?Patient Details ?Name: Izsak Johnson. ?MRN: [id number] ?DOB: August 26, 1960 ?Today's Date: 11/07/2021 ? ? ?History of Present Illness Pt is a 61 y.o. M who presents 10/30/2021 with multiple falls; thought to be possible seizure activity vs syncopal episodes. Found to have left MT fxs with left 5th and possible 4th MT base fxs. CT head negative for acute abnoramlity. Significant PMH: chronic systolic CHF/ICM s/p Medtronic single chamber ICD, HM III LVAD, CAD s/p CABG, ESRD on HD. ? ?  ?PT Comments  ? ?  Pt is progressing well towards his physical therapy goals. Able to self direct PT for donning CAM boot and verbalizing need for carrying LVAD equipment bag. Pt requiring assist to switch from wall power to batteries, which is his baseline. Pt is overall mobilizing well. Ambulating 200 ft with a walker at a supervision level. Negotiated a half flight of stairs with railings with cues for sequencing/technique. Updated d/c plan.  ?  ?Recommendations for follow up therapy are one component of a multi-disciplinary discharge planning process, led by the attending physician.  Recommendations may be updated based on patient status, additional functional criteria and insurance authorization. ? ?Follow Up Recommendations ? Home health PT (denied from CIR) ?  ?  ?Assistance Recommended at Discharge Frequent or constant Supervision/Assistance  ?Patient can return home with the following A little help with walking and/or transfers;A little help with bathing/dressing/bathroom;Assistance with cooking/housework;Assist for transportation;Help with stairs or ramp for entrance ?  ?Equipment Recommendations ? None recommended by PT  ?  ?Recommendations for Other Services   ? ? ?  ?Precautions / Restrictions Precautions ?Precautions: Fall ?Precaution Comments: LVAD ?Required Braces or Orthoses: Other Brace ?Other Brace: L CAM boot ?Restrictions ?Weight Bearing Restrictions: Yes ?LLE Weight Bearing: Weight  bearing as tolerated  ?  ? ?Mobility ? Bed Mobility ?  ?  ?  ?  ?  ?  ?  ?General bed mobility comments: Sitting EOB upon arrival ?  ? ?Transfers ?Overall transfer level: Needs assistance ?Equipment used: Rolling walker (2 wheels) ?Transfers: Sit to/from Stand ?Sit to Stand: Min guard, Min assist ?  ?  ?  ?  ?  ?General transfer comment: MinA from low surface of bed, min guard from standard height chair. Pt self cueing for hand placement ?  ? ?Ambulation/Gait ?Ambulation/Gait assistance: Supervision ?Gait Distance (Feet): 200 Feet ?Assistive device: Rolling walker (2 wheels) ?Gait Pattern/deviations: Decreased step length - right, Decreased step length - left, Decreased stride length, Decreased weight shift to left, Trunk flexed, Step-through pattern ?  ?  ?  ?General Gait Details: Slow, mostly steady gait ? ? ?Stairs ?Stairs: Yes ?Stairs assistance: Min guard ?Stair Management: One rail Right, One rail Left ?Number of Stairs: 10 ?General stair comments: cues for sequencing/technique, min guard for safety ? ? ?Wheelchair Mobility ?  ? ?Modified Rankin (Stroke Patients Only) ?  ? ? ?  ?Balance Overall balance assessment: Needs assistance ?Sitting-balance support: No upper extremity supported, Feet supported ?Sitting balance-Leahy Scale: Good ?  ?  ?Standing balance support: During functional activity ?Standing balance-Leahy Scale: Fair ?Standing balance comment: Reliant on RW, able to stand at sink and brush teeth with mihn guard ?  ?  ?  ?  ?  ?  ?  ?  ?  ?  ?  ?  ? ?  ?Cognition Arousal/Alertness: Awake/alert ?Behavior During Therapy: [facility] for tasks assessed/performed ?Overall Cognitive Status: Within Functional Limits for tasks assessed ?  ?  ?  ?  ?  ?  ?  ?  ?  ?  ?  ?  ?  ?  ?  ?  ?  General Comments: pleasant and cooperative patient, distractable. ?  ?  ? ?  ?Exercises   ? ?  ?General Comments   ?  ?  ? ?Pertinent Vitals/Pain Pain Assessment ?Pain Assessment: Faces ?Faces Pain Scale: Hurts a little bit ?Pain  Location: Lft foot in WB ?Pain Descriptors / Indicators: Discomfort, Grimacing ?Pain Intervention(s): Monitored during session  ? ? ?Home Living   ?  ?  ?  ?  ?  ?  ?  ?  ?  ?   ?  ?Prior Function    ?  ?  ?   ? ?PT Goals (current goals can now be found in the care plan section) Acute Rehab PT Goals ?Patient Stated Goal: to walk ?Potential to Achieve Goals: Good ?Progress towards PT goals: Progressing toward goals ? ?  ?Frequency ? ? ? Min 3X/week ? ? ? ?  ?PT Plan Discharge plan needs to be updated  ? ? ?Co-evaluation   ?  ?  ?  ?  ? ?  ?AM-PAC PT "6 Clicks" Mobility   ?Outcome Measure ? Help needed turning from your back to your side while in a flat bed without using bedrails?: None ?Help needed moving from lying on your back to sitting on the side of a flat bed without using bedrails?: None ?Help needed moving to and from a bed to a chair (including a wheelchair)?: A Little ?Help needed standing up from a chair using your arms (e.g., wheelchair or bedside chair)?: A Little ?Help needed to walk in hospital room?: A Little ?Help needed climbing 3-5 steps with a railing? : A Little ?6 Click Score: 20 ? ?  ?End of Session Equipment Utilized During Treatment: Other (comment) (CAM boot) ?Activity Tolerance: Patient tolerated treatment well ?Patient left: in bed;with call bell/phone within reach ?Nurse Communication: Mobility status ?PT Visit Diagnosis: Unsteadiness on feet (R26.81);Other abnormalities of gait and mobility (R26.89);History of falling (Z91.81);Difficulty in walking, not elsewhere classified (R26.2) ?  ? ? ?Time: 5732-2025 ?PT Time Calculation (min) (ACUTE ONLY): 37 min ? ?Charges:  $Gait Training: 8-22 mins ?$Therapeutic Activity: 23-37 mins          ?          ? ?Wyona Almas, PT, DPT ?Acute Rehabilitation Services ?Pager [phone] ?Office [phone] ? ? ? ?Marcus Johnson ?11/07/2021, 5:09 PM ? ?

## 2021-11-07 NOTE — Progress Notes (Signed)
Inpatient Rehabilitation Admissions Coordinator  ? ?I have faxed clinicals today also to Abigail Butts at expedited appeal phone [phone] fax [phone] for [place]'s phone is "invalid". ? ?Danne Baxter, RN, MSN ?Rehab Admissions Coordinator ?[phone] ?11/07/2021 12:49 PM ? ?

## 2021-11-08 ENCOUNTER — Other Ambulatory Visit (HOSPITAL_COMMUNITY): Payer: Self-pay

## 2021-11-08 LAB — BASIC METABOLIC PANEL
Anion gap: 8 (ref 5–15)
BUN: 42 mg/dL — ABNORMAL HIGH (ref 6–20)
CO2: 26 mmol/L (ref 22–32)
Calcium: 9.5 mg/dL (ref 8.9–10.3)
Chloride: 101 mmol/L (ref 98–111)
Creatinine, Ser: 4.1 mg/dL — ABNORMAL HIGH (ref 0.61–1.24)
GFR, Estimated: 16 mL/min — ABNORMAL LOW (ref >60)
Glucose, Bld: 96 mg/dL (ref 70–99)
Potassium: 4.2 mmol/L (ref 3.5–5.1)
Sodium: 135 mmol/L (ref 135–145)

## 2021-11-08 LAB — CBC
HCT: 28 % — ABNORMAL LOW (ref 39.0–52.0)
Hemoglobin: 9.4 g/dL — ABNORMAL LOW (ref 13.0–17.0)
MCH: 30.2 pg (ref 26.0–34.0)
MCHC: 33.6 g/dL (ref 30.0–36.0)
MCV: 90 fL (ref 80.0–100.0)
Platelets: 157 10*3/uL (ref 150–400)
RBC: 3.11 MIL/uL — ABNORMAL LOW (ref 4.22–5.81)
RDW: 13.3 % (ref 11.5–15.5)
WBC: 7.7 10*3/uL (ref 4.0–10.5)
nRBC: 0 % (ref 0.0–0.2)

## 2021-11-08 LAB — MAGNESIUM: Magnesium: 2.1 mg/dL (ref 1.7–2.4)

## 2021-11-08 LAB — PROTIME-INR
INR: 2.1 — ABNORMAL HIGH (ref 0.8–1.2)
Prothrombin Time: 23.2 seconds — ABNORMAL HIGH (ref 11.4–15.2)

## 2021-11-08 LAB — LACTATE DEHYDROGENASE: LDH: 163 U/L (ref 98–192)

## 2021-11-08 LAB — GLUCOSE, CAPILLARY
Glucose-Capillary: 73 mg/dL (ref 70–99)
Glucose-Capillary: 72 mg/dL (ref 70–99)

## 2021-11-08 MED ORDER — ACETAMINOPHEN 325 MG PO TABS: 650.0000 mg | ORAL_TABLET | ORAL | Status: DC | PRN

## 2021-11-08 MED ORDER — CHLORHEXIDINE GLUCONATE CLOTH 2 % EX PADS: 6.0000 | MEDICATED_PAD | Freq: Every day | CUTANEOUS | Status: DC

## 2021-11-08 MED ORDER — MIDODRINE HCL 10 MG PO TABS: ORAL_TABLET | ORAL | Status: DC

## 2021-11-08 MED ORDER — TRAMADOL HCL 50 MG PO TABS: 50.0000 mg | ORAL_TABLET | Freq: Two times a day (BID) | ORAL | 0 refills | Status: DC | PRN

## 2021-11-08 MED ORDER — ATORVASTATIN CALCIUM 40 MG PO TABS: 40.0000 mg | ORAL_TABLET | Freq: Every day | ORAL | Status: DC

## 2021-11-08 MED ORDER — ONDANSETRON HCL 4 MG/2ML IJ SOLN: 4.0000 mg | Freq: Four times a day (QID) | INTRAMUSCULAR | 0 refills | Status: DC | PRN

## 2021-11-08 MED ORDER — INSULIN ASPART 100 UNIT/ML IJ SOLN: 0.0000 [IU] | Freq: Every day | INTRAMUSCULAR | 11 refills | Status: DC

## 2021-11-08 MED ORDER — INSULIN ASPART 100 UNIT/ML IJ SOLN: 0.0000 [IU] | Freq: Three times a day (TID) | INTRAMUSCULAR | 11 refills | Status: DC

## 2021-11-08 MED ORDER — NAPHAZOLINE-GLYCERIN 0.012-0.25 % OP SOLN: 1.0000 [drp] | Freq: Four times a day (QID) | OPHTHALMIC | Status: AC | PRN

## 2021-11-08 MED ORDER — DOXYCYCLINE HYCLATE 100 MG PO TABS
100.0000 mg | ORAL_TABLET | Freq: Two times a day (BID) | ORAL | 0 refills | Status: DC
  Filled 2021-11-08: qty 28, 14d supply, fill #0

## 2021-11-08 MED ORDER — GABAPENTIN 100 MG PO CAPS
100.0000 mg | ORAL_CAPSULE | Freq: Every day | ORAL | Status: DC
Start: 2021-11-08 — End: 2021-11-08

## 2021-11-08 MED ORDER — TRAMADOL HCL 50 MG PO TABS: 50.0000 mg | ORAL_TABLET | Freq: Two times a day (BID) | ORAL | Status: DC | PRN

## 2021-11-08 MED ORDER — GABAPENTIN 100 MG PO CAPS: 200.0000 mg | ORAL_CAPSULE | Freq: Every day | ORAL | Status: DC

## 2021-11-08 MED ORDER — WARFARIN SODIUM 3 MG PO TABS: ORAL_TABLET | ORAL | 3 refills | Status: DC

## 2021-11-08 NOTE — Progress Notes (Signed)
D/C order noted this am. Due to pt's insurance denial for CIR, pt will return home. Contacted [place] and spoke to [employer] [place] manager. Clinic advised of pt's d/c today and that pt should resume on Friday. Clinic aware of pt's need for LVAD from pt having device prior to admission. ? ? ?Melven Sartorius ?Renal Navigator ?[phone] ?

## 2021-11-08 NOTE — TOC Progression Note (Signed)
Transition of Care (TOC) - Progression Note  ? ? ?Patient Details  ?Name: Marcus Johnson. ?MRN: [id number] ?Date of Birth: [birth year] ? ?Transition of Care (TOC) CM/SW Contact  ?Angelita Ingles, RN ?Phone [phone] ? ?11/08/2021, 3:10 PM ? ?Clinical Narrative:    ?Cm received call from patients nurse requesting CM speak with patient about HH. Cm at bedside to offer to set up home health. Patient states that he has told 3 people that he does not want home health. He states that he is getting aggravated and does not want to talk about it. CM has made nurse aware.  ? ? ?Expected Discharge Plan: [place] ?Barriers to Discharge: Continued Medical Work up ? ?Expected Discharge Plan and Services ?Expected Discharge Plan: [place] ?  ?Discharge Planning Services: CM Consult ?  ?Living arrangements for the past 2 months: Apartment ?Expected Discharge Date: 11/08/21               ?  ?  ?  ?  ?  ?  ?  ?  ?  ?  ? ? ?Social Determinants of Health (SDOH) Interventions ?  ? ?Readmission Risk Interventions ? ?  11/14/2020  ? 11:28 AM  ?Readmission Risk Prevention Plan  ?Transportation Screening Complete  ?Medication Review Press photographer) Complete  ?PCP or Specialist appointment within 3-5 days of discharge Complete  ?[place] or Home Care Consult Complete  ?SW Recovery Care/Counseling Consult Complete  ?Palliative Care Screening Not Applicable  ?[place] Not Applicable  ? ? ?

## 2021-11-08 NOTE — Progress Notes (Addendum)
Patient ID: Marcus Johnson., male   DOB: 04-10-1961, 61 y.o.   MRN: [id number] ?  ?Advanced Heart Failure VAD Team Note ? ?PCP-Cardiologist: None  ?HF Cardiologist: Dr. Aundra Dubin ? ?Subjective:   ? ?Started on empiric vancomycin for suspected DL infection. Wound culture NGTD.  Afebrile.  ? ?Improved w/ adjusted EDW at HD. Less orthostatic symptoms. MAPs in 80s.  ? ?INR 2.1  ? ?Feels better.  ? ?Insurance denied CIR. PT reassessed and now recommends [facility] PT but pt declining. Wants to go home today w/ family support.  ? ?LVAD INTERROGATION:  ?HeartMate III LVAD:   ?Flow 5.0 liters/min, speed 5800, power 4.6, PI 3.4.  1 PI event  ?Objective:   ? ?Vital Signs:   ?Temp:  [97.8 ?F (36.6 ?C)-98.2 ?F (36.8 ?C)] 98.2 ?F (36.8 ?C) (04/05 0330) ?Pulse Rate:  [84-91] 84 (04/05 0330) ?Resp:  [12-21] 18 (04/05 0330) ?BP: (94-110)/(79-86) 94/79 (04/05 0330) ?SpO2:  [96 %-99 %] 97 % (04/05 0330) ?Weight:  [75.5 kg] 75.5 kg (04/05 0346) ?Last BM Date : 11/06/21 ?Mean arterial Pressure  80d ? ?Intake/Output:  ? ?Intake/Output Summary (Last 24 hours) at 11/08/2021 6270 ?Last data filed at 11/08/2021 0346 ?Gross per 24 hour  ?Intake 970 ml  ?Output 400 ml  ?Net 570 ml  ?  ? ?Physical Exam  ?  ?Physical Exam: ?GENERAL: Well appearing, sitting up on side of bed. No distress.  ?HEENT: normal  ?NECK: Supple, JVD 8 cm  2+ bilaterally, no bruits.   ?CARDIAC:  Mechanical heart sounds with LVAD hum present.  ?LUNGS:  clear bilaterally   ?ABDOMEN:  Soft, round, nontender, positive bowel sounds x4.     ?LVAD exit site: well-healed and incorporated.  Dressing dry and intact.  Old blood noted from DL.  Stabilization device present and accurately applied.  Driveline dressing is being changed daily per sterile technique. ?EXTREMITIES:  Warm and dry, no cyanosis, clubbing, rash or edema Lt LE in boot  ?NEUROLOGIC:  Alert and oriented x 4.  Gait steady.  No aphasia.  No dysarthria.  Affect pleasant.    ? ? ?Telemetry  ? ?NSR 80s  ? ?EKG  ?  ?No new EKG  to review  ? ?Labs  ? ?Basic Metabolic Panel: ?Recent Labs  ?Lab 11/04/21 ?0116 11/05/21 ?3500 11/06/21 ?0111 11/07/21 ?0110 11/08/21 ?9381  ?NA 137 140 133* 138 135  ?K 3.8 3.9 3.8 3.9 4.2  ?CL 100 100 99 103 101  ?CO2 '29 25 24 27 26  '$ ?GLUCOSE 95 87 121* 111* 96  ?BUN 19 36* 45* 23* 42*  ?CREATININE 3.01* 4.25* 5.02* 2.97* 4.10*  ?CALCIUM 9.2 9.7 9.2 9.1 9.5  ?MG 1.9 1.9 1.8 1.7 2.1  ? ? ?Liver Function Tests: ?No results for input(s): AST, ALT, ALKPHOS, BILITOT, PROT, ALBUMIN in the last 168 hours. ? ?No results for input(s): LIPASE, AMYLASE in the last 168 hours. ?No results for input(s): AMMONIA in the last 168 hours. ? ?CBC: ?Recent Labs  ?Lab 11/04/21 ?0116 11/05/21 ?8299 11/06/21 ?0111 11/07/21 ?0110 11/08/21 ?3716  ?WBC 6.9 6.9 7.0 7.2 7.7  ?HGB 10.2* 9.8* 9.7* 9.8* 9.4*  ?HCT 30.8* 30.5* 29.3* 29.9* 28.0*  ?MCV 89.5 90.8 90.2 89.5 90.0  ?PLT 150 140* 155 153 157  ? ? ?INR: ?Recent Labs  ?Lab 11/04/21 ?0116 11/05/21 ?9678 11/06/21 ?0111 11/07/21 ?0110 11/08/21 ?9381  ?INR 2.1* 2.1* 2.0* 2.0* 2.1*  ? ? ?Other results: ?EKG:  ? ? ?Imaging  ? ?No results found. ? ? ?  Medications:   ? ? ?Scheduled Medications: ? atorvastatin  40 mg Oral Daily  ? Chlorhexidine Gluconate Cloth  6 each Topical Q0600  ? doxycycline  100 mg Oral Q12H  ? gabapentin  100 mg Oral Daily  ? gabapentin  200 mg Oral QHS  ? insulin aspart  0-5 Units Subcutaneous QHS  ? insulin aspart  0-9 Units Subcutaneous TID WC  ? midodrine  10 mg Oral 3 times per day on Mon Wed Fri  ? pantoprazole  40 mg Oral Daily  ? sodium chloride flush  3 mL Intravenous Q12H  ? warfarin  4.5 mg Oral q1600  ? Warfarin - Pharmacist Dosing Inpatient   Does not apply [id number]  ? ? ?Infusions: ? sodium chloride    ? ? ?PRN Medications: ?sodium chloride, acetaminophen, naphazoline-glycerin, ondansetron (ZOFRAN) IV, sodium chloride flush, traMADol ? ? ?Patient Profile  ? ?61 y.o. male with history of chornic systolic CHF/ICM s/p Medtronic single chamber ICD, HM III LVAD, CAD  s/p CABG, ESRD on HD. Now presenting with multiple falls and ? Seizure activity/syncopal episodes.  ? ?Assessment/Plan:   ? ?1. Falls: Weakness and multiple falls prior to admission. Suspect too much fluid off with HD and orthostasis. No sustained VT on device interrogation.  Doing better now with higher dry weight.  ?- Continue higher EDW per nephrology.  ?- Continue midodrine 10 tid on HD days, have stopped non-HD midodrine with higher MAP.  ?2. Chronic systolic CHF:  Ischemic cardiomyopathy. Has Medtronic ICD. Low output HF, admitted and Impella 5.5 placed 10/04/20.  HM3 LVAD was placed on 10/13/20. Developed post-op renal failure requiring eventual hemodialysis.  MAP now elevated.  For now, will accept elevated MAP on non-HD days.  LDH stable.   ?- NYHA II. Volume looks good on exam, managed with HD. ?- Continue midodrine 10 mg tid on HD days, stopped midodrine on non-HD days.   ?3. LVAD HM III:  VAD interrogated personally.  He is now off ASA.  ?- Continue warfarin with INR goal 2-2.5.  PharmD to dose warfarin. INR 2.1. ?- Treating with vancomycin for driveline infection. Bloody drainage noted on admit. Suspect trauma from recent fall. ?4. CAD s/p CABG 2002:  Last cath in 6/19 with patent grafts. No chest pain.  ?- Continue Crestor.  ?5. ESRD: Currently dialyzing via tunneled catheter. No low flow events.  ?- Continue HD M-W-F. Nephrology following  ?- With continuous flow LVAD, think AV fistula would be unlikely to mature.   ?6. Atrial flutter/fibrillation: s/p DC-CV 08/21/18.   ?- on coumadin ?7. 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  ?- Continue gabapentin ?8. DM: SSI  ?9. Anemia: Hgb stable, 9.4 today. ?10. Suspected Gastroparesis. Nausea improved on Reglan.  ?11. Left Metatarsal Fractures: X-rays showed left 5th and possibly 4th MT base fxs. Ortho consulted: conservative management ?- plan WBAT in CAM boot ?- will f/u w/ ortho in 2 wks   ?12. Possible driveline infection: Wound culture negative. Has been on vancomycin, transition to doxycycline at discharge to complete 2 wks treatment.  ?13. Deconditioning: Insurance denied CIR. PT now recommending HH PT  ?14. Hypomagnesemia: Resolved w/ supp. Mg 2.1 today  ? ?He is due for HD today. Will plan d/c home today after his HD.  ? ? ?I reviewed the LVAD parameters from today, and compared the results to the patient's prior recorded data.  No programming changes were made.  The LVAD is functioning within specified  parameters.  The patient performs LVAD self-test daily.  LVAD interrogation was negative for any significant power changes, alarms or PI events/speed drops.  LVAD equipment check completed and is in good working order.  Back-up equipment present.   LVAD education done on emergency procedures and precautions and reviewed exit site care. ? ?Length of Stay: 9 ? ?Lyda Jester, PA-C ?11/08/2021, 7:12 AM ? ?VAD Team --- VAD ISSUES ONLY--- ?Pager [phone] (7am - 7am) ? ?Advanced Heart Failure Team  ?Pager [phone] (M-F; 7a - 5p)  ?Please contact [facility] for night-coverage after hours (5p -7a ) and weekends on [email or web address] ? ?Patient seen with PA, agree with the above note.  ? ?Seen by PT yesterday, doing better.  Think he can manage at home at this point.  No lightheadedness.  ? ?General: Well appearing this am. NAD.  ?HEENT: Normal. ?Neck: Supple, JVP 7-8 cm. Carotids OK.  ?Cardiac:  Mechanical heart sounds with LVAD hum present.  ?Lungs:  CTAB, normal effort.  ?Abdomen:  NT, ND, no HSM. No bruits or masses. +BS  ?LVAD exit site: Well-healed and incorporated. Dressing dry and intact. No erythema or drainage. Stabilization device present and accurately applied. Driveline dressing changed daily per sterile technique. ?Extremities:  Warm and dry. No cyanosis, clubbing, rash, or edema. Left foot in boot.  ?Neuro:  Alert & oriented x 3. Cranial nerves grossly intact. Moves all 4 extremities w/o  difficulty. Affect pleasant   ? ?Mobility improving.  No lightheadedness with increased EDW.  Think he can go home now without rehab stay.  Will get HD today, then family will take him home afterwards.  W

## 2021-11-08 NOTE — Progress Notes (Signed)
Admit: 10/30/2021 ?LOS: 9 ? ?25M ESRD on HD [facility] TDC MWF, +LVAD; s/p fall at home and s/p L metatarsal fracture ? ?Subjective: no c/o's today, seen on HD ? ?04/04 0701 - 04/05 0700 ?In: 49 [P.O.:960; I.V.:10] ?Out: 400 [Urine:400] ? ?Filed Weights  ? 11/07/21 0438 11/08/21 0346 11/08/21 0950  ?Weight: 73.2 kg 75.5 kg 72 kg  ? ? ?Scheduled Meds: ? atorvastatin  40 mg Oral Daily  ? Chlorhexidine Gluconate Cloth  6 each Topical Q0600  ? doxycycline  100 mg Oral Q12H  ? gabapentin  100 mg Oral Daily  ? gabapentin  200 mg Oral QHS  ? insulin aspart  0-5 Units Subcutaneous QHS  ? insulin aspart  0-9 Units Subcutaneous TID WC  ? midodrine  10 mg Oral 3 times per day on Mon Wed Fri  ? pantoprazole  40 mg Oral Daily  ? sodium chloride flush  3 mL Intravenous Q12H  ? warfarin  4.5 mg Oral q1600  ? Warfarin - Pharmacist Dosing Inpatient   Does not apply [id number]  ? ?Continuous Infusions: ? sodium chloride    ? ?PRN Meds:.sodium chloride, acetaminophen, naphazoline-glycerin, ondansetron (ZOFRAN) IV, sodium chloride flush, traMADol ? ?Current Labs: reviewed  ? ? ?Physical Exam:  Blood pressure 122/84, pulse 92, temperature 97.8 ?F (36.6 ?C), temperature source Oral, resp. rate 18, height '5\' 10"'$  (1.778 m), weight 72 kg, SpO2 100 %. ?[place] NAD seen in room ?Pleasant positive affect ?Cont hum on chest exam ?Nl WOB ?R IJ TDC c/d/I ?No sig LEE ?Nonfocal CN2-12 intact ? ?OP HD: [facility] MWF ? 4h  400/1.5  71kg  2/2 bath  TDC  Hep none ?- pt weighs w/ controller (0.8kg) and 2 batteries (1kg total) at the OP unit,  total to subtract at OP unit is 1.8kg ?- here in hospital is just wearing the controller so total to subtract is 0.8kg ?- on 3/28 > hep B Ag neg and hep B Ab's were high / protective ?-Mircera 30 mcg IV q 2 weeks (last dose 10/25/2021) ?-Venofer 50 mg IV weekly ?-No VDRA ? ?Assessment/ Plan: ?ESRD - HD MWF [facility] TDC. HD Wednesday.  ?Syncope/ malaise post HD - recurrent issue per the pt, we are raising his dry wt to see if this helps. No  vol ^ on exam. Raise dry wt 71kg >> 72.2kg. No fluid off w/ HD today. MAPs are higher per CHF team and pt feeling better.  ?HFrEF/ sp LVAD - on vanc for possible driveline infection ?S/p L metatarsal fractures - ortho following, PT/OT; CIR denied but in process of appealing ?Hypotension - midodrine now lowered to '10mg'$  pre HD mwf due to higher MAPs.  ?Anemia esrd - hb stable, due for ESA 4/5 ?CMD-BMD-  not on binders, Ca ok, CTM ?DM2 ?Dispo - for dc home today after HD ? ?Kelly Splinter, MD ?11/08/2021, 12:07 PM ? ? ? ?Recent Labs  ?Lab 11/06/21 ?0111 11/07/21 ?0110 11/08/21 ?7673  ?NA 133* 138 135  ?K 3.8 3.9 4.2  ?CL 99 103 101  ?CO2 '24 27 26  '$ ?GLUCOSE 121* 111* 96  ?BUN 45* 23* 42*  ?CREATININE 5.02* 2.97* 4.10*  ?CALCIUM 9.2 9.1 9.5  ? ? ?Recent Labs  ?Lab 11/06/21 ?0111 11/07/21 ?0110 11/08/21 ?4193  ?WBC 7.0 7.2 7.7  ?HGB 9.7* 9.8* 9.4*  ?HCT 29.3* 29.9* 28.0*  ?MCV 90.2 89.5 90.0  ?PLT 155 153 157  ? ? ? ? ? ? ? ? ? ? ?  ?

## 2021-11-08 NOTE — Progress Notes (Signed)
ANTICOAGULATION CONSULT NOTE ? ?Pharmacy Consult for warfarin ?Indication:  LVAD HM3 ? ?Allergies  ?Allergen Reactions  ? Meclizine Hcl Anaphylaxis and Swelling  ? Ivabradine Nausea Only  ? ? ?Patient Measurements: ?Height: '5\' 10"'$  (177.8 cm) ?Weight: 72 kg (158 lb 11.7 oz) ?IBW/kg (Calculated) : 73 ?Heparin Dosing Weight: 71.7 kg ? ?Vital Signs: ?Temp: 98.4 ?F (36.9 ?C) (04/05 1357) ?Temp Source: Oral (04/05 1357) ?BP: 133/101 (04/05 1357) ?Pulse Rate: 90 (04/05 1357) ? ?Labs: ?Recent Labs  ?  11/06/21 ?0111 11/07/21 ?0110 11/08/21 ?2778  ?HGB 9.7* 9.8* 9.4*  ?HCT 29.3* 29.9* 28.0*  ?PLT 155 153 157  ?LABPROT 22.3* 22.3* 23.2*  ?INR 2.0* 2.0* 2.1*  ?CREATININE 5.02* 2.97* 4.10*  ? ? ? ?Estimated Creatinine Clearance: 19.5 mL/min (A) (by C-G formula based on SCr of 4.1 mg/dL (H)). ? ? ?Medical History: ?Past Medical History:  ?Diagnosis Date  ? AICD (automatic cardioverter/defibrillator) present   ? Single-chamber  implantable cardiac defibrillator - Medtronic  ? Atrial fibrillation ([place])   ? Cataract   ? Mixed form OU  ? CHF (congestive heart failure) ([place])   ? Chronic kidney disease   ? Chronic kidney disease (CKD), stage III (moderate) (HCC)   ? Chronic systolic heart failure ([place])   ? a. Echo 5/13: Mild LVE, mild LVH, EF 10%, anteroseptal, lateral, apical AK, mild MR, mild LAE, moderately reduced RVSF, mild RAE, PASP 60;  b. 07/2014 Echo: EF 20-2%, diff HK, AKI of antsep/apical/mid-apicalinferior, mod reduced RV.  ? Coronary artery disease   ? a. s/p CABG 2002 b. LHC 5/13:  dLM 80%, LAD subtotally occluded, pCFX occluded, pRCA 50%, mid? Occlusion with high take off of the PDA with 70% multiple lesions-not bypassed and supplies collaterals to LAD, LIMA-IM/ramus ok, S-OM ok, S-PLA branches ok. Medical therapy was recommended  ? COVID   ? Diabetic retinopathy ([place])   ? NPDR OD, PDR OS  ? Dyspnea   ? Gout   ? "on daily RX" (01/08/2018)  ? Hypertension   ? Hypertensive retinopathy   ? OU  ? Ischemic cardiomyopathy   ?  a. Echo 5/13: Mild LVE, mild LVH, EF 10%, anteroseptal, lateral, apical AK, mild MR, mild LAE, moderately reduced RVSF, mild RAE, PASP 60;  b. 01/2012 s/p MDT [id number] Protecta XT VR AICD;  c. 07/2014 Echo: EF 20-2%, diff HK, AKI of antsep/apical/mid-apicalinferior, mod reduced RV.  ? MRSA (methicillin resistant Staphylococcus aureus)   ? Status post right foot plantar deep infection with MRSA status post  I&D 10/2008  ? Myocardial infarction [facility])   ? "was told I'd had several before heart OR in 2002" (01/08/2018)  ? Noncompliance   ? Peripheral neuropathy   ? Retinopathy, diabetic, background ([place])   ? Syncope   ? Type II diabetes mellitus ([place])   ? requiring insulin   ? Vitreous hemorrhage, left (HCC)   ? and proliferative diabetic retinopathy  ? ? ?Medications:  ?Scheduled:  ? atorvastatin  40 mg Oral Daily  ? Chlorhexidine Gluconate Cloth  6 each Topical Q0600  ? doxycycline  100 mg Oral Q12H  ? gabapentin  100 mg Oral Daily  ? gabapentin  200 mg Oral QHS  ? insulin aspart  0-5 Units Subcutaneous QHS  ? insulin aspart  0-9 Units Subcutaneous TID WC  ? midodrine  10 mg Oral 3 times per day on Mon Wed Fri  ? pantoprazole  40 mg Oral Daily  ? sodium chloride flush  3 mL Intravenous Q12H  ?  warfarin  4.5 mg Oral q1600  ? Warfarin - Pharmacist Dosing Inpatient   Does not apply [id number]  ? ? ?Assessment: ?35 yom presenting after fall, head CT negative. Was found to have mild displaced fractures at 5h metatarsal. On warfarin PTA for hx LVAD HM3. ? ?INR remains therapeutic today at 2.1, CBC and LDH stable. ? ?PTA regimen 4.5 mg daily except 6 mg on Tuesday. ? ?Goal of Therapy:  ?INR 2-2.5 ?Monitor platelets by anticoagulation protocol: Yes ?  ?Plan:  ?Warfarin 4.'5mg'$  daily for now ?Monitor daily INR ? ? ? ?Bonnita Nasuti Pharm.D. CPP, BCPS ?Clinical Pharmacist ?[phone] ?11/08/2021 3:14 PM ] ?Please check AMION for all [place] numbers ?11/08/2021 ? ? ? ? ?

## 2021-11-08 NOTE — Progress Notes (Signed)
LVAD Coordinator Rounding Note: ? ?HM III LVAD implanted on 10/13/20 by Dr Cyndia Bent under Destination Therapy criteria. ? ?Pt admitted from ED to Dr. Claris Gladden service due to multiple falls and syncope. ? ?Patient laying in bed talking on phone with [facility]. Just finished breakfast. He is very excited about going home today.  ? ?His insurance [facility]) is reviewing his appeal for IP rehab admission- pt denied again. PT/OT recommending HHPT. Pt declined HHPT at this time.  ? ?Plan to discharge home after HD today per Dr Aundra Dubin. Follow up appts scheduled and placed on AVS. I called and spoke with pt's brother [name] about the below appts: ?VAD clinic follow up appointment: 11/21/21 at 10:00. Parking garage code: 1104 ?[facility] (orthopedic appointment): 11/23/21 at 9:30.  ?Office address: [address] [place] [address].  ?Office Phone: [phone] or [phone] (appt desk)  ? ?Vital signs: ?Temp: 97.6 ?HR: 86 ?Doppler: 88 ?Auto cuff: 107/91 (98) ?O2 Sat: 100% on RA ?Wt: 156.5>155.8>160.5>168.6>160.9>159.8>158>161>163.5>161.3>166.4 lbs   ? ? ?LVAD interrogation reveals:  ?Speed: 5800 ?Flow: 5.1 ?Power: 4.6w ?PI: 3.5 ?Hematocrit: 31 ? ?Alarms: none ?Events: none ? ?Fixed speed: 5800 ?Low speed limit: 5500 ? ?Drive Line: Existing VAD dressing C/D/I with anchors x 2 intact and accurately applied. Patent wearing breast binder. Existing VAD dressing removed and site care performed using sterile technique. Drive line exit site cleaned with Chlora prep applicators x 2, allowed to dry, and gauze dressing applied. Exit site healed and incorporated, the velour is fully implanted at exit site. Pt has small amount of bloody drainage. No redness, tenderness, foul odor or rash noted. Drive line anchor x 2 applied. Breast binder reapplied. Will continue MWF dressing changes per BS nurse (or as needed to keep dressing C/D/I). Next dressing change due 11/10/21. ? ? ?Labs:  ?LDH trend:  202>334>356>861>683>729>021>115>520 ? ?INR trend: 1.8>1.8>2.0>1.9>2.1>2.1>2.0>2.0>2.1 ? ?Anticoagulation Plan: ?-INR Goal: 2.0-2.5 ?-ASA Dose: none ? ?Device:  ?-  Medtronic single ?-  Pacing: VVI 40 ?- Therapies: on 200 bpm ?-  Last device check 10/10/21 ? ?Plan/Recommendations:  ?1. Call VAD coordinator for equipment or drive line issues ?2. MWF dressing changes using 2 anchors and abdominal binder. Next dressing due 11/10/21 per BS nurse.  ?3. Plan to discharge home after HD treatment.  ?4. VAD clinic follow up appointment: 11/21/21 at 10:00. Parking garage code: 1104 ?5. [facility] (orthopedic appointment): 11/23/21 at 9:30.  ?Office address: [address] [place] [address].  ?Office Phone: [phone] or [phone] (appt desk)  ? ?Emerson Monte RN ?VAD Coordinator  ?Office: [phone]  ?24/7 Pager: [phone]  ? ? ? ? ? ? ? ? ?

## 2021-11-08 NOTE — Patient Instructions (Addendum)
VAD clinic follow up appointment: 11/21/21 at 10:00. Parking garage code: 1104 ?[facility] (orthopedic appointment): 11/23/21 at 9:30.  ?Office address: [address] [place] [address].  ?Office Phone: [phone] or [phone] (appt desk)  ?

## 2021-11-09 DIAGNOSIS — R296 Repeated falls: Secondary | ICD-10-CM | POA: Insufficient documentation

## 2021-11-10 ENCOUNTER — Telehealth: Payer: Self-pay | Admitting: Nephrology

## 2021-11-10 DIAGNOSIS — N186 End stage renal disease: Secondary | ICD-10-CM | POA: Diagnosis not present

## 2021-11-10 DIAGNOSIS — N2581 Secondary hyperparathyroidism of renal origin: Secondary | ICD-10-CM | POA: Diagnosis not present

## 2021-11-10 DIAGNOSIS — Z992 Dependence on renal dialysis: Secondary | ICD-10-CM | POA: Diagnosis not present

## 2021-11-10 NOTE — Telephone Encounter (Signed)
Transition of care contact from inpatient facility ? ?Date of Discharge: 11/08/21 ?Date of Contact: 11/10/21 ?Method of contact: Phone ? ?Attempted to contact patient to discuss transition of care from inpatient admission. Patient did not answer the phone. Message was left on the patient's voicemail. Will try to reach at dialysis.  ? ?

## 2021-11-13 DIAGNOSIS — Z992 Dependence on renal dialysis: Secondary | ICD-10-CM | POA: Diagnosis not present

## 2021-11-13 DIAGNOSIS — N2581 Secondary hyperparathyroidism of renal origin: Secondary | ICD-10-CM | POA: Diagnosis not present

## 2021-11-13 DIAGNOSIS — N186 End stage renal disease: Secondary | ICD-10-CM | POA: Diagnosis not present

## 2021-11-14 ENCOUNTER — Ambulatory Visit (HOSPITAL_COMMUNITY): Payer: Self-pay | Admitting: Pharmacist

## 2021-11-14 LAB — POCT INR: INR: 2.1 (ref 2.0–3.0)

## 2021-11-14 NOTE — Progress Notes (Signed)
LVAD INR 

## 2021-11-15 DIAGNOSIS — N186 End stage renal disease: Secondary | ICD-10-CM | POA: Diagnosis not present

## 2021-11-15 DIAGNOSIS — Z992 Dependence on renal dialysis: Secondary | ICD-10-CM | POA: Diagnosis not present

## 2021-11-15 DIAGNOSIS — N2581 Secondary hyperparathyroidism of renal origin: Secondary | ICD-10-CM | POA: Diagnosis not present

## 2021-11-17 ENCOUNTER — Other Ambulatory Visit (HOSPITAL_COMMUNITY): Payer: Self-pay | Admitting: Cardiology

## 2021-11-17 DIAGNOSIS — Z992 Dependence on renal dialysis: Secondary | ICD-10-CM | POA: Diagnosis not present

## 2021-11-17 DIAGNOSIS — N186 End stage renal disease: Secondary | ICD-10-CM | POA: Diagnosis not present

## 2021-11-17 DIAGNOSIS — N2581 Secondary hyperparathyroidism of renal origin: Secondary | ICD-10-CM | POA: Diagnosis not present

## 2021-11-20 DIAGNOSIS — N2581 Secondary hyperparathyroidism of renal origin: Secondary | ICD-10-CM | POA: Diagnosis not present

## 2021-11-20 DIAGNOSIS — Z992 Dependence on renal dialysis: Secondary | ICD-10-CM | POA: Diagnosis not present

## 2021-11-20 DIAGNOSIS — N186 End stage renal disease: Secondary | ICD-10-CM | POA: Diagnosis not present

## 2021-11-21 ENCOUNTER — Ambulatory Visit (HOSPITAL_COMMUNITY)
Admit: 2021-11-21 | Discharge: 2021-11-21 | Disposition: A | Discharge status: Home / Self Care | Payer: Medicare HMO | Attending: Cardiology | Admitting: Cardiology

## 2021-11-21 ENCOUNTER — Ambulatory Visit (HOSPITAL_COMMUNITY): Payer: Self-pay | Admitting: Pharmacist

## 2021-11-21 ENCOUNTER — Encounter (HOSPITAL_COMMUNITY): Payer: Self-pay

## 2021-11-21 VITALS — BP 99/67 | HR 93 | Wt 160.8 lb

## 2021-11-21 DIAGNOSIS — D649 Anemia, unspecified: Secondary | ICD-10-CM | POA: Diagnosis not present

## 2021-11-21 DIAGNOSIS — N186 End stage renal disease: Secondary | ICD-10-CM | POA: Diagnosis not present

## 2021-11-21 DIAGNOSIS — I4891 Unspecified atrial fibrillation: Secondary | ICD-10-CM | POA: Insufficient documentation

## 2021-11-21 DIAGNOSIS — Z992 Dependence on renal dialysis: Secondary | ICD-10-CM | POA: Diagnosis not present

## 2021-11-21 DIAGNOSIS — I2510 Atherosclerotic heart disease of native coronary artery without angina pectoris: Secondary | ICD-10-CM | POA: Insufficient documentation

## 2021-11-21 DIAGNOSIS — Z95811 Presence of heart assist device: Secondary | ICD-10-CM | POA: Diagnosis not present

## 2021-11-21 DIAGNOSIS — Z79899 Other long term (current) drug therapy: Secondary | ICD-10-CM | POA: Diagnosis not present

## 2021-11-21 DIAGNOSIS — I132 Hypertensive heart and chronic kidney disease with heart failure and with stage 5 chronic kidney disease, or end stage renal disease: Secondary | ICD-10-CM | POA: Insufficient documentation

## 2021-11-21 DIAGNOSIS — I5022 Chronic systolic (congestive) heart failure: Secondary | ICD-10-CM | POA: Insufficient documentation

## 2021-11-21 DIAGNOSIS — I4892 Unspecified atrial flutter: Secondary | ICD-10-CM | POA: Insufficient documentation

## 2021-11-21 DIAGNOSIS — E1122 Type 2 diabetes mellitus with diabetic chronic kidney disease: Secondary | ICD-10-CM | POA: Insufficient documentation

## 2021-11-21 DIAGNOSIS — Z7901 Long term (current) use of anticoagulants: Secondary | ICD-10-CM | POA: Insufficient documentation

## 2021-11-21 DIAGNOSIS — Z951 Presence of aortocoronary bypass graft: Secondary | ICD-10-CM | POA: Insufficient documentation

## 2021-11-21 DIAGNOSIS — Z95810 Presence of automatic (implantable) cardiac defibrillator: Secondary | ICD-10-CM | POA: Insufficient documentation

## 2021-11-21 DIAGNOSIS — I255 Ischemic cardiomyopathy: Secondary | ICD-10-CM | POA: Insufficient documentation

## 2021-11-21 DIAGNOSIS — G540 Brachial plexus disorders: Secondary | ICD-10-CM | POA: Diagnosis not present

## 2021-11-21 LAB — POCT INR: INR: 1.9 — AB (ref 2.0–3.0)

## 2021-11-21 NOTE — Progress Notes (Addendum)
Patient presents for hospital d/c f/u in [facility] today with brother Marcus Johnson). Reports no problems with VAD equipment or concerns with drive line. ? ?Patient arrived via wheelchair today. CAM boot in place on left foot. Pt reports pain is well controlled. He is able to ambulate and climb stairs without too much difficulty. Denies lightheadedness/dizziness on non-HD days, falls, heart failure symptoms, and signs of bleeding.  ? ?Patient reports he continues M/W/F dialysis and that is dry weight has been dropped to 72.5 kg. Pt confirms he takes Midodrine to 10 mg TID on HD days only. Reports lightheadedness/dizziness after dialysis treatments has improved some but he does still have episodes occasionally, especially since hospitalization. Spoke with Jolene RN and [facility] center- plan to increase dry weight to 73 kg to see if this helps orthostatic symptoms. Also messaged Dr Joelyn Oms in [facility] regarding orthostatic symptoms.  ? ?Pt reports he completed course of Doxy yesterday for drive line discharge. He is wearing 2 anchors and abdominal binder. He feels this is very helpful preventing further drive line trauma. Dressing changed; see below.  ? ?Labs drawn at dialysis. Results from 11/15/21 reviewed by Dr. Aundra Dubin.  ? ?Pt returned loaner MPU [id number]. Replaced with [id number] Man: 07/20/21. ? ?Vital Signs:  ?Temp:  ?Doppler Pressure: 86 ?Automatc BP: 99/67 (78) ?HR: 93 ?O2: 100% RA ?  ?Weight: 160.8 lb w/ eqt & CAM boot ?Last wt: 166.4 lb ?  ?VAD Indication: ?Destination Therapy due to CKD Stage 3b ?  ?LVAD assessment:  HM III:: ?VAD Speed: 5200 rpms ?Flow: 5.6 ?Power: 4.7w    ?PI: 3.0 ?Alarms: none ?Events: 100+ PI events on HD days ? ?Fixed speed:  5200 ?Low speed limit:  4900 ? ?Primary controller: back up battery due for replacement in 18 months ?Secondary controller:  back up battery due for replacement in 25 months ? ?I reviewed the LVAD parameters from today and compared the results to the patient's prior  recorded data. LVAD interrogation was NEGATIVE for significant power changes, NEGATIVE for clinical alarms and STABLE for PI events/speed drops. No programming changes were made and pump is functioning within specified parameters. Pt is performing daily controller and system monitor self tests along with completing weekly and monthly maintenance for LVAD equipment. ?  ?LVAD equipment check completed and is in good working order. Back-up equipment present.  ?  ?Annual Equipment Maintenance on UBC/PM was performed today 10/12/21. ? ?Exit Site Care: ?Gauze VAD dressing and anchor removed and site care performed using sterile technique. Drive line exit site cleaned with Chlora prep applicators x 2, allowed to dry before Sorbaview dressing with silverlon patch applied. Exit site incorporated. The velour is fully implanted at exit site. No redness or tenderness noted. Drive line anchor re-applied x 2. Abdominal binder in place. Pt denies fever or chills. Will advance back to weekly dressing change per sterile technique by son [name]).  Provided with 8 weekly dressing kits and 10 anchors for home use.  ? ? ?Device: Medtronic single ?Therapies: on 150 bpm ?Pacing: VVI 40 ?Last check:  07/12/21 ?  ?BP & Labs:  ?Doppler 86 - Doppler is reflecting modified systolic ?  ?Hgb 10.4 on labs from HD 11/15/21- No S/S of bleeding. Specifically denies melena/BRBPR or nosebleeds. ?  ?LDH difficult stick. Labs not obtained today - established baseline of 160 - 180. Denies tea-colored urine. No power elevations noted on interrogation.  ? ?Patient Instructions: ?No medication changes today   ?Coumadin dosing per Ander Purpura PharmD ?You  may advance to weekly dressing changes. If drainage reoccurs notify VAD coordinators  ?Return to [place] clinic in 2 months ? ?Emerson Monte RN ?VAD Coordinator  ?Office: [phone]  ?24/7 Pager: [phone]  ? ? ?Follow up for Heart Failure/LVAD: ? ?Marcus Johnson. is a 61 y.o. male who has a h/o DM2, CAD  s/p CABG 5784, systolic HF due to ischemic cardiomyopathy with EF 20-25% (echo 12/15), DM2 and CKD. He is s/p Medtronic single chamber ICD. ?  ?Admitted in 12/15 due to ADHF. Required short course milrinone for diuresis. Diuresed 30 pounds.  ?  ?CPX 2/16 showed severely reduced functional capacity.  There was a significant disconnect between his symptoms and the CPX.  RHC in 6/16 showed fairly normal filling pressures and low but not markedly low cardiac output.  ?  ?CPX (4/19) was submaximal but suggested severe limitation from HF.  ?  ?He was admitted in 6/19 with marked volume overload and dyspnea.  Echo in 6/19 showed EF 20% with severe global hypokinesis.  LHC/RHC showed severe native vessel CAD with patent LIMA-D, SVG-OM, and SVG-PLV; cardiac index low.  He was started on milrinone and diuresed.  We discussed LVAD extensively in light of low output, creatinine that is trending up and concern for decompensation of RV over time. He was adamant that he did not want to pursue LVAD workup yet and did not want a referral for transplant evaluation. Milrinone was weaned off and he was discharged home.  ?  ?Admitted 08/01/18-08/07/18 with volume overload and left foot wound. Diuresed 38 lbs with IV lasix and then transitioned back to torsemide 100 mg BID. ID was consulted with concerns for left foot osteomyelitis noted on CT scan. He got IV ancef q8 hours x 42 days. AHC provided teaching and supplies for home antibiotics. Course complicated by AKI. DC weight 200 lbs.  ?  ?He went into atrial flutter and had DCCV to NSR in 1/20.  ?  ?Echo in 4/21 showed EF 25% with apical akinesis and diffuse hypokinesis relatively preserved in lateral wall, mildly decreased RV systolic function, PASP 41 mmHg. CPX in 5/21 showed severe functional limitation suggestive of advanced HF. 5/21 Zio patch showed short atrial flutter runs, 3.8% PVCs.  ?  ?He developed COVID-19 infection in 1/22.  Symptoms were predominantly GI.   ? ?He developed  progressive worsening of CHF in early 2022 and was admitted in 3/22 with low output HF.  Impella 5.5 was placed as bridge to Heartmate III LVAD placed in 10/13/20.  He developed post-op renal failure and ended up on dialysis.  He was markedly deconditioned post-op and went to CIR.   ? ?Still has pain and minimal function in his right hand.  Nerve conduction study showed a chronic sensorimotor neuropathy in the right arm.   ? ?Ramp echo in 3/23 showed severe LV dysfunction with mild RV enlargement and mild dysfunction, mild-moderate AI.  Speed was increased to 5800 rpm.  The aortic valve still opened with every beat but only slightly. Flow increased from 4.1 to 5.3 L/min and PI remained stable.  ? ?Patient was admitted in 4/23 with falls and lightheadedness. He fractured his left foot when falling and initially was unable to walk.  He was thought to be hypotensive due to over-diuresis and dry weight was increased.  Midodrine on HD days was also increased.  He was treated for a driveline infection with vancomycin then doxycycline, now off.  ? ?Patient returns today for followup of CHF/LVAD.  He is doing better.  No falls.  Able to walk without much difficulty with left foot in boot.  Has followup with orthopedics for fracture on Thursday.  Yesterday after HD, he was a little dizzy but feels fine today.  No dyspnea walking slowly on flat ground.  Able to climb the stairs to his apartment. Frequent PI events on HD days, similar to the past.  ? ?I reviewed the LVAD parameters from today, and compared the results to the patient's prior recorded data.  No programming changes were made.  The LVAD is functioning within specified parameters.  The patient performs LVAD self-test daily.  LVAD interrogation was negative for any significant power changes, alarms or PI events/speed drops.  LVAD equipment check completed and is in good working order.  Back-up equipment present.   LVAD education done on emergency procedures and  precautions and reviewed exit site care. ? ?Labs (5/22): LDH 174, hgb 10.6 ?Labs (7/22): LDH 164 ?Labs (8/22): hgb 11.4 ?Labs (10/22): hgb 12 ?Labs (11/22): hgb 12.2 ?Labs (4/23): LDL 163, hgb 10.4 ? ? ?Past M

## 2021-11-21 NOTE — Progress Notes (Signed)
LVAD INR 

## 2021-11-21 NOTE — Patient Instructions (Signed)
NO medication changes today   ?Coumadin dosing per Ander Purpura PharmD ?You may advance to weekly dressing changes. If drainage reoccurs notify VAD coordinators  ?Return to [place] clinic in 2 months ?

## 2021-11-22 DIAGNOSIS — N186 End stage renal disease: Secondary | ICD-10-CM | POA: Diagnosis not present

## 2021-11-22 DIAGNOSIS — Z992 Dependence on renal dialysis: Secondary | ICD-10-CM | POA: Diagnosis not present

## 2021-11-22 DIAGNOSIS — N2581 Secondary hyperparathyroidism of renal origin: Secondary | ICD-10-CM | POA: Diagnosis not present

## 2021-11-23 DIAGNOSIS — M79672 Pain in left foot: Secondary | ICD-10-CM | POA: Diagnosis not present

## 2021-11-23 DIAGNOSIS — S92355A Nondisplaced fracture of fifth metatarsal bone, left foot, initial encounter for closed fracture: Secondary | ICD-10-CM | POA: Diagnosis not present

## 2021-11-24 DIAGNOSIS — N186 End stage renal disease: Secondary | ICD-10-CM | POA: Diagnosis not present

## 2021-11-24 DIAGNOSIS — N2581 Secondary hyperparathyroidism of renal origin: Secondary | ICD-10-CM | POA: Diagnosis not present

## 2021-11-24 DIAGNOSIS — Z992 Dependence on renal dialysis: Secondary | ICD-10-CM | POA: Diagnosis not present

## 2021-11-27 DIAGNOSIS — Z992 Dependence on renal dialysis: Secondary | ICD-10-CM | POA: Diagnosis not present

## 2021-11-27 DIAGNOSIS — N2581 Secondary hyperparathyroidism of renal origin: Secondary | ICD-10-CM | POA: Diagnosis not present

## 2021-11-27 DIAGNOSIS — N186 End stage renal disease: Secondary | ICD-10-CM | POA: Diagnosis not present

## 2021-11-28 ENCOUNTER — Ambulatory Visit (HOSPITAL_COMMUNITY): Payer: Self-pay | Admitting: Pharmacist

## 2021-11-28 LAB — POCT INR: INR: 2 (ref 2.0–3.0)

## 2021-11-28 NOTE — Progress Notes (Signed)
LVAD INR 

## 2021-11-29 DIAGNOSIS — N2581 Secondary hyperparathyroidism of renal origin: Secondary | ICD-10-CM | POA: Diagnosis not present

## 2021-11-29 DIAGNOSIS — N186 End stage renal disease: Secondary | ICD-10-CM | POA: Diagnosis not present

## 2021-11-29 DIAGNOSIS — Z992 Dependence on renal dialysis: Secondary | ICD-10-CM | POA: Diagnosis not present

## 2021-12-01 DIAGNOSIS — N2581 Secondary hyperparathyroidism of renal origin: Secondary | ICD-10-CM | POA: Diagnosis not present

## 2021-12-01 DIAGNOSIS — N186 End stage renal disease: Secondary | ICD-10-CM | POA: Diagnosis not present

## 2021-12-01 DIAGNOSIS — Z992 Dependence on renal dialysis: Secondary | ICD-10-CM | POA: Diagnosis not present

## 2021-12-03 DIAGNOSIS — Z992 Dependence on renal dialysis: Secondary | ICD-10-CM | POA: Diagnosis not present

## 2021-12-03 DIAGNOSIS — N186 End stage renal disease: Secondary | ICD-10-CM | POA: Diagnosis not present

## 2021-12-03 DIAGNOSIS — I509 Heart failure, unspecified: Secondary | ICD-10-CM | POA: Diagnosis not present

## 2021-12-04 ENCOUNTER — Telehealth (HOSPITAL_COMMUNITY): Payer: Self-pay | Admitting: Unknown Physician Specialty

## 2021-12-04 DIAGNOSIS — N186 End stage renal disease: Secondary | ICD-10-CM | POA: Diagnosis not present

## 2021-12-04 DIAGNOSIS — N2581 Secondary hyperparathyroidism of renal origin: Secondary | ICD-10-CM | POA: Diagnosis not present

## 2021-12-04 DIAGNOSIS — Z992 Dependence on renal dialysis: Secondary | ICD-10-CM | POA: Diagnosis not present

## 2021-12-04 MED ORDER — DOXYCYCLINE HYCLATE 100 MG PO CAPS: 100.0000 mg | ORAL_CAPSULE | Freq: Two times a day (BID) | ORAL | 0 refills | Status: AC

## 2021-12-04 NOTE — Telephone Encounter (Signed)
Received call from pt stating that his son changed his driveline yesterday and he had "a lot of drainage." Pt states that this drainage was bloody, denies any odor. His son placed a weekly dressing back on the driveline. D/w Dr Aundra Dubin we will start the pt on Doxycycline 100 mg bid for 2 weeks. Pt was instructed to put a gauze dressing on the driveline. Pt was also instructed to let us know if drainage increases or has odor. Pt instructed to place double anchor on the site to try to prevent trauma during sleeping. Pt verbalized understanding of all instructions given. ? ?Tanda Rockers RN, BSN ?VAD Coordinator ?24/7 Pager [phone] ? ?

## 2021-12-05 ENCOUNTER — Ambulatory Visit (HOSPITAL_COMMUNITY): Payer: Self-pay | Admitting: Pharmacist

## 2021-12-05 DIAGNOSIS — Z7901 Long term (current) use of anticoagulants: Secondary | ICD-10-CM | POA: Diagnosis not present

## 2021-12-05 LAB — POCT INR: INR: 1.7 — AB (ref 2.0–3.0)

## 2021-12-05 NOTE — Progress Notes (Signed)
LVAD INR 

## 2021-12-06 DIAGNOSIS — N186 End stage renal disease: Secondary | ICD-10-CM | POA: Diagnosis not present

## 2021-12-06 DIAGNOSIS — Z992 Dependence on renal dialysis: Secondary | ICD-10-CM | POA: Diagnosis not present

## 2021-12-06 DIAGNOSIS — N2581 Secondary hyperparathyroidism of renal origin: Secondary | ICD-10-CM | POA: Diagnosis not present

## 2021-12-07 ENCOUNTER — Encounter (HOSPITAL_COMMUNITY): Payer: Medicare HMO

## 2021-12-08 DIAGNOSIS — N2581 Secondary hyperparathyroidism of renal origin: Secondary | ICD-10-CM | POA: Diagnosis not present

## 2021-12-08 DIAGNOSIS — Z992 Dependence on renal dialysis: Secondary | ICD-10-CM | POA: Diagnosis not present

## 2021-12-08 DIAGNOSIS — N186 End stage renal disease: Secondary | ICD-10-CM | POA: Diagnosis not present

## 2021-12-12 ENCOUNTER — Ambulatory Visit (HOSPITAL_COMMUNITY): Payer: Self-pay | Admitting: Pharmacist

## 2021-12-12 LAB — POCT INR: INR: 2.8 (ref 2.0–3.0)

## 2021-12-12 NOTE — Progress Notes (Signed)
LVAD INR 

## 2021-12-13 DIAGNOSIS — N186 End stage renal disease: Secondary | ICD-10-CM | POA: Diagnosis not present

## 2021-12-13 DIAGNOSIS — N2581 Secondary hyperparathyroidism of renal origin: Secondary | ICD-10-CM | POA: Diagnosis not present

## 2021-12-13 DIAGNOSIS — Z992 Dependence on renal dialysis: Secondary | ICD-10-CM | POA: Diagnosis not present

## 2021-12-14 DIAGNOSIS — M79672 Pain in left foot: Secondary | ICD-10-CM | POA: Diagnosis not present

## 2021-12-14 DIAGNOSIS — S92355A Nondisplaced fracture of fifth metatarsal bone, left foot, initial encounter for closed fracture: Secondary | ICD-10-CM | POA: Diagnosis not present

## 2021-12-15 DIAGNOSIS — Z992 Dependence on renal dialysis: Secondary | ICD-10-CM | POA: Diagnosis not present

## 2021-12-15 DIAGNOSIS — N186 End stage renal disease: Secondary | ICD-10-CM | POA: Diagnosis not present

## 2021-12-15 DIAGNOSIS — N2581 Secondary hyperparathyroidism of renal origin: Secondary | ICD-10-CM | POA: Diagnosis not present

## 2021-12-18 DIAGNOSIS — N2581 Secondary hyperparathyroidism of renal origin: Secondary | ICD-10-CM | POA: Diagnosis not present

## 2021-12-18 DIAGNOSIS — Z992 Dependence on renal dialysis: Secondary | ICD-10-CM | POA: Diagnosis not present

## 2021-12-18 DIAGNOSIS — N186 End stage renal disease: Secondary | ICD-10-CM | POA: Diagnosis not present

## 2021-12-19 ENCOUNTER — Ambulatory Visit (HOSPITAL_COMMUNITY): Payer: Self-pay | Admitting: Pharmacist

## 2021-12-19 LAB — POCT INR: INR: 1.8 — AB (ref 2.0–3.0)

## 2021-12-19 NOTE — Progress Notes (Signed)
LVAD INR 

## 2021-12-20 DIAGNOSIS — N2581 Secondary hyperparathyroidism of renal origin: Secondary | ICD-10-CM | POA: Diagnosis not present

## 2021-12-20 DIAGNOSIS — N186 End stage renal disease: Secondary | ICD-10-CM | POA: Diagnosis not present

## 2021-12-20 DIAGNOSIS — Z992 Dependence on renal dialysis: Secondary | ICD-10-CM | POA: Diagnosis not present

## 2021-12-22 ENCOUNTER — Other Ambulatory Visit: Payer: Self-pay | Admitting: Family Medicine

## 2021-12-22 ENCOUNTER — Other Ambulatory Visit (HOSPITAL_COMMUNITY): Payer: Self-pay | Admitting: Cardiology

## 2021-12-22 ENCOUNTER — Other Ambulatory Visit (HOSPITAL_COMMUNITY): Payer: Self-pay | Admitting: *Deleted

## 2021-12-22 DIAGNOSIS — I951 Orthostatic hypotension: Secondary | ICD-10-CM

## 2021-12-22 DIAGNOSIS — K3184 Gastroparesis: Secondary | ICD-10-CM

## 2021-12-22 DIAGNOSIS — Z992 Dependence on renal dialysis: Secondary | ICD-10-CM | POA: Diagnosis not present

## 2021-12-22 DIAGNOSIS — Z95811 Presence of heart assist device: Secondary | ICD-10-CM

## 2021-12-22 DIAGNOSIS — Z7901 Long term (current) use of anticoagulants: Secondary | ICD-10-CM

## 2021-12-22 DIAGNOSIS — N186 End stage renal disease: Secondary | ICD-10-CM | POA: Diagnosis not present

## 2021-12-22 DIAGNOSIS — N2581 Secondary hyperparathyroidism of renal origin: Secondary | ICD-10-CM | POA: Diagnosis not present

## 2021-12-22 MED ORDER — PANTOPRAZOLE SODIUM 40 MG PO TBEC: 40.0000 mg | DELAYED_RELEASE_TABLET | Freq: Every day | ORAL | 3 refills | Status: DC

## 2021-12-22 NOTE — Telephone Encounter (Signed)
Last OV- 12/27/20 Medication has been discontinued on 11/08/2021  No future OV scheduled.   Can patient receive a refill?

## 2021-12-25 DIAGNOSIS — N2581 Secondary hyperparathyroidism of renal origin: Secondary | ICD-10-CM | POA: Diagnosis not present

## 2021-12-25 DIAGNOSIS — Z992 Dependence on renal dialysis: Secondary | ICD-10-CM | POA: Diagnosis not present

## 2021-12-25 DIAGNOSIS — N186 End stage renal disease: Secondary | ICD-10-CM | POA: Diagnosis not present

## 2021-12-26 ENCOUNTER — Ambulatory Visit (HOSPITAL_COMMUNITY): Payer: Self-pay | Admitting: Pharmacist

## 2021-12-26 LAB — POCT INR: INR: 2 (ref 2.0–3.0)

## 2021-12-26 NOTE — Progress Notes (Signed)
LVAD INR 

## 2021-12-27 DIAGNOSIS — N2581 Secondary hyperparathyroidism of renal origin: Secondary | ICD-10-CM | POA: Diagnosis not present

## 2021-12-27 DIAGNOSIS — N186 End stage renal disease: Secondary | ICD-10-CM | POA: Diagnosis not present

## 2021-12-27 DIAGNOSIS — Z992 Dependence on renal dialysis: Secondary | ICD-10-CM | POA: Diagnosis not present

## 2021-12-29 DIAGNOSIS — Z992 Dependence on renal dialysis: Secondary | ICD-10-CM | POA: Diagnosis not present

## 2021-12-29 DIAGNOSIS — N2581 Secondary hyperparathyroidism of renal origin: Secondary | ICD-10-CM | POA: Diagnosis not present

## 2021-12-29 DIAGNOSIS — N186 End stage renal disease: Secondary | ICD-10-CM | POA: Diagnosis not present

## 2022-01-01 DIAGNOSIS — N2581 Secondary hyperparathyroidism of renal origin: Secondary | ICD-10-CM | POA: Diagnosis not present

## 2022-01-01 DIAGNOSIS — N186 End stage renal disease: Secondary | ICD-10-CM | POA: Diagnosis not present

## 2022-01-01 DIAGNOSIS — Z992 Dependence on renal dialysis: Secondary | ICD-10-CM | POA: Diagnosis not present

## 2022-01-02 ENCOUNTER — Ambulatory Visit (HOSPITAL_COMMUNITY): Payer: Self-pay | Admitting: Pharmacist

## 2022-01-02 DIAGNOSIS — Z7901 Long term (current) use of anticoagulants: Secondary | ICD-10-CM | POA: Diagnosis not present

## 2022-01-02 LAB — POCT INR: INR: 2.2 (ref 2.0–3.0)

## 2022-01-02 NOTE — Progress Notes (Signed)
LVAD INR 

## 2022-01-03 DIAGNOSIS — N186 End stage renal disease: Secondary | ICD-10-CM | POA: Diagnosis not present

## 2022-01-03 DIAGNOSIS — Z992 Dependence on renal dialysis: Secondary | ICD-10-CM | POA: Diagnosis not present

## 2022-01-03 DIAGNOSIS — N2581 Secondary hyperparathyroidism of renal origin: Secondary | ICD-10-CM | POA: Diagnosis not present

## 2022-01-03 DIAGNOSIS — I509 Heart failure, unspecified: Secondary | ICD-10-CM | POA: Diagnosis not present

## 2022-01-05 DIAGNOSIS — N2581 Secondary hyperparathyroidism of renal origin: Secondary | ICD-10-CM | POA: Diagnosis not present

## 2022-01-05 DIAGNOSIS — N186 End stage renal disease: Secondary | ICD-10-CM | POA: Diagnosis not present

## 2022-01-05 DIAGNOSIS — Z992 Dependence on renal dialysis: Secondary | ICD-10-CM | POA: Diagnosis not present

## 2022-01-08 DIAGNOSIS — N2581 Secondary hyperparathyroidism of renal origin: Secondary | ICD-10-CM | POA: Diagnosis not present

## 2022-01-08 DIAGNOSIS — Z992 Dependence on renal dialysis: Secondary | ICD-10-CM | POA: Diagnosis not present

## 2022-01-08 DIAGNOSIS — N186 End stage renal disease: Secondary | ICD-10-CM | POA: Diagnosis not present

## 2022-01-09 ENCOUNTER — Ambulatory Visit (HOSPITAL_COMMUNITY): Payer: Self-pay | Admitting: Pharmacist

## 2022-01-09 ENCOUNTER — Ambulatory Visit (INDEPENDENT_AMBULATORY_CARE_PROVIDER_SITE_OTHER): Payer: Medicare HMO

## 2022-01-09 DIAGNOSIS — I255 Ischemic cardiomyopathy: Secondary | ICD-10-CM | POA: Diagnosis not present

## 2022-01-09 LAB — CUP PACEART REMOTE DEVICE CHECK
Battery Remaining Longevity: 129 mo
Battery Voltage: 3.02 V
Brady Statistic RV Percent Paced: 0.01 %
Date Time Interrogation Session: 20230606060932
HighPow Impedance: 31 Ohm
HighPow Impedance: 37 Ohm
Implantable Lead Implant Date: 20130603
Implantable Lead Location: 753860
Implantable Lead Model: 6947
Implantable Pulse Generator Implant Date: 20211129
Lead Channel Impedance Value: 285 Ohm
Lead Channel Impedance Value: 551 Ohm
Lead Channel Pacing Threshold Amplitude: 1.25 V
Lead Channel Pacing Threshold Pulse Width: 0.4 ms
Lead Channel Sensing Intrinsic Amplitude: 4 mV
Lead Channel Sensing Intrinsic Amplitude: 4 mV
Lead Channel Setting Pacing Amplitude: 2.75 V
Lead Channel Setting Pacing Pulse Width: 0.4 ms
Lead Channel Setting Sensing Sensitivity: 0.3 mV

## 2022-01-09 LAB — POCT INR: INR: 2.8 (ref 2.0–3.0)

## 2022-01-09 NOTE — Progress Notes (Signed)
LVAD INR 

## 2022-01-10 DIAGNOSIS — N2581 Secondary hyperparathyroidism of renal origin: Secondary | ICD-10-CM | POA: Diagnosis not present

## 2022-01-10 DIAGNOSIS — N186 End stage renal disease: Secondary | ICD-10-CM | POA: Diagnosis not present

## 2022-01-10 DIAGNOSIS — Z992 Dependence on renal dialysis: Secondary | ICD-10-CM | POA: Diagnosis not present

## 2022-01-12 ENCOUNTER — Ambulatory Visit (HOSPITAL_COMMUNITY)
Admission: RE | Admit: 2022-01-12 | Discharge: 2022-01-12 | Disposition: A | Discharge status: Home / Self Care | Payer: Medicare HMO | Source: Ambulatory Visit | Attending: Cardiology | Admitting: Cardiology

## 2022-01-12 DIAGNOSIS — N2581 Secondary hyperparathyroidism of renal origin: Secondary | ICD-10-CM | POA: Diagnosis not present

## 2022-01-12 DIAGNOSIS — T827XXA Infection and inflammatory reaction due to other cardiac and vascular devices, implants and grafts, initial encounter: Secondary | ICD-10-CM | POA: Diagnosis not present

## 2022-01-12 DIAGNOSIS — Z95811 Presence of heart assist device: Secondary | ICD-10-CM | POA: Insufficient documentation

## 2022-01-12 DIAGNOSIS — Y849 Medical procedure, unspecified as the cause of abnormal reaction of the patient, or of later complication, without mention of misadventure at the time of the procedure: Secondary | ICD-10-CM | POA: Diagnosis not present

## 2022-01-12 DIAGNOSIS — N186 End stage renal disease: Secondary | ICD-10-CM | POA: Diagnosis not present

## 2022-01-12 DIAGNOSIS — T8579XA Infection and inflammatory reaction due to other internal prosthetic devices, implants and grafts, initial encounter: Secondary | ICD-10-CM | POA: Insufficient documentation

## 2022-01-12 DIAGNOSIS — Z48812 Encounter for surgical aftercare following surgery on the circulatory system: Secondary | ICD-10-CM | POA: Diagnosis not present

## 2022-01-12 DIAGNOSIS — Z992 Dependence on renal dialysis: Secondary | ICD-10-CM | POA: Diagnosis not present

## 2022-01-12 MED ORDER — DOXYCYCLINE HYCLATE 100 MG PO CAPS: 100.0000 mg | ORAL_CAPSULE | Freq: Two times a day (BID) | ORAL | 0 refills | Status: AC

## 2022-01-12 NOTE — Patient Instructions (Addendum)
Start Doxycycline 100 mg twice a day for 14 days Change drive line dressing every other day using daily kit and silver strip Call VAD coordinator for increased drainage, redness, tenderness, fever, or chills. [phone] (pager)  Return to VAD clinic for previously scheduled appt on 01/30/22

## 2022-01-12 NOTE — Progress Notes (Signed)
Pt paged VAD coordinator this morning reporting bloody drive line drainage. Instructed to come to clinic prior to dialysis for drive line check.  Pt presents to clinic with his brother [name]. Reports that when his son changed his dressing yesterday he noticed site "looked irritated" and moderate amount of bloody drainage. Pt reports he has not been wear abdominal binder as instructed due to it "being so hot". Advised to wear binder at night when in bed to prevent trauma. He verbalized understanding. He is only wearing one anchor today. Advised to wear 2 anchors at all times. He verbalized understanding.   Dressing changed today; see documentation below. Discussed with Dr Aundra Dubin. Will start Doxycycline 100 mg BID x 14 days. Prescription sent to pt's pharmacy. Instructed to call VAD coordinator for increased drainage, redness, tenderness, fever, or chills. He and [name] both verbalized understanding. Had [employer]. Paged successfully.   Exit Site Care: Dressing maintained weekly by pt's son. Gauze VAD dressing and anchor removed and site care performed using sterile technique. Drive line exit site cleaned with Chlora prep applicators x 2, allowed to dry before gauze dressing with silverlon patch applied. Exit site partially incorporated. The velour is fully implanted at exit site. No redness, tenderness, or foul odor noted. Small amount of bloody drainage at site and on silver strip. Hypergranulation tissue at exit site. Silver nitrate to bleeding hypergranulation tissue; hemostasis achieved. Drive line anchor re-applied x 2. Pt denies fever or chills. Instructed to change dressing every other day using daily kit and silver strip. Provided with 14 daily dressing kits, silver strips, and 15 anchors for home use.    Plan: Start Doxycycline 100 mg twice a day for 14 days Change drive line dressing every other day using daily kit and silver strip Call VAD coordinator for increased drainage,  redness, tenderness, fever, or chills. [phone] (pager)  Return to VAD clinic for previously scheduled appt on 01/30/22  Emerson Monte RN [place] Coordinator  Office: [phone]  24/7 Pager: [phone]

## 2022-01-15 DIAGNOSIS — Z992 Dependence on renal dialysis: Secondary | ICD-10-CM | POA: Diagnosis not present

## 2022-01-15 DIAGNOSIS — N186 End stage renal disease: Secondary | ICD-10-CM | POA: Diagnosis not present

## 2022-01-15 DIAGNOSIS — N2581 Secondary hyperparathyroidism of renal origin: Secondary | ICD-10-CM | POA: Diagnosis not present

## 2022-01-16 ENCOUNTER — Ambulatory Visit (HOSPITAL_COMMUNITY): Payer: Self-pay | Admitting: Pharmacist

## 2022-01-16 ENCOUNTER — Other Ambulatory Visit (HOSPITAL_COMMUNITY): Payer: Self-pay | Admitting: Cardiology

## 2022-01-16 DIAGNOSIS — K3184 Gastroparesis: Secondary | ICD-10-CM

## 2022-01-16 DIAGNOSIS — G629 Polyneuropathy, unspecified: Secondary | ICD-10-CM

## 2022-01-16 DIAGNOSIS — Z7901 Long term (current) use of anticoagulants: Secondary | ICD-10-CM

## 2022-01-16 DIAGNOSIS — Z95811 Presence of heart assist device: Secondary | ICD-10-CM

## 2022-01-16 DIAGNOSIS — I951 Orthostatic hypotension: Secondary | ICD-10-CM

## 2022-01-16 LAB — POCT INR: INR: 2.4 (ref 2.0–3.0)

## 2022-01-16 NOTE — Progress Notes (Signed)
LVAD INR 

## 2022-01-17 DIAGNOSIS — Z992 Dependence on renal dialysis: Secondary | ICD-10-CM | POA: Diagnosis not present

## 2022-01-17 DIAGNOSIS — N186 End stage renal disease: Secondary | ICD-10-CM | POA: Diagnosis not present

## 2022-01-17 DIAGNOSIS — N2581 Secondary hyperparathyroidism of renal origin: Secondary | ICD-10-CM | POA: Diagnosis not present

## 2022-01-19 DIAGNOSIS — N2581 Secondary hyperparathyroidism of renal origin: Secondary | ICD-10-CM | POA: Diagnosis not present

## 2022-01-19 DIAGNOSIS — Z992 Dependence on renal dialysis: Secondary | ICD-10-CM | POA: Diagnosis not present

## 2022-01-19 DIAGNOSIS — N186 End stage renal disease: Secondary | ICD-10-CM | POA: Diagnosis not present

## 2022-01-22 DIAGNOSIS — N2581 Secondary hyperparathyroidism of renal origin: Secondary | ICD-10-CM | POA: Diagnosis not present

## 2022-01-22 DIAGNOSIS — Z992 Dependence on renal dialysis: Secondary | ICD-10-CM | POA: Diagnosis not present

## 2022-01-22 DIAGNOSIS — N186 End stage renal disease: Secondary | ICD-10-CM | POA: Diagnosis not present

## 2022-01-23 ENCOUNTER — Encounter (HOSPITAL_COMMUNITY): Payer: Medicare HMO

## 2022-01-23 ENCOUNTER — Ambulatory Visit (HOSPITAL_COMMUNITY): Payer: Self-pay | Admitting: Pharmacist

## 2022-01-23 LAB — POCT INR: INR: 2.4 (ref 2.0–3.0)

## 2022-01-23 NOTE — Progress Notes (Signed)
LVAD INR 

## 2022-01-24 DIAGNOSIS — N2581 Secondary hyperparathyroidism of renal origin: Secondary | ICD-10-CM | POA: Diagnosis not present

## 2022-01-24 DIAGNOSIS — Z992 Dependence on renal dialysis: Secondary | ICD-10-CM | POA: Diagnosis not present

## 2022-01-24 DIAGNOSIS — N186 End stage renal disease: Secondary | ICD-10-CM | POA: Diagnosis not present

## 2022-01-24 NOTE — Progress Notes (Signed)
Remote ICD transmission.   

## 2022-01-26 DIAGNOSIS — N2581 Secondary hyperparathyroidism of renal origin: Secondary | ICD-10-CM | POA: Diagnosis not present

## 2022-01-26 DIAGNOSIS — Z992 Dependence on renal dialysis: Secondary | ICD-10-CM | POA: Diagnosis not present

## 2022-01-26 DIAGNOSIS — N186 End stage renal disease: Secondary | ICD-10-CM | POA: Diagnosis not present

## 2022-01-29 DIAGNOSIS — Z992 Dependence on renal dialysis: Secondary | ICD-10-CM | POA: Diagnosis not present

## 2022-01-29 DIAGNOSIS — N186 End stage renal disease: Secondary | ICD-10-CM | POA: Diagnosis not present

## 2022-01-29 DIAGNOSIS — N2581 Secondary hyperparathyroidism of renal origin: Secondary | ICD-10-CM | POA: Diagnosis not present

## 2022-01-30 ENCOUNTER — Ambulatory Visit (HOSPITAL_COMMUNITY)
Admission: RE | Admit: 2022-01-30 | Discharge: 2022-01-30 | Disposition: A | Discharge status: Home / Self Care | Payer: Medicare HMO | Source: Ambulatory Visit | Attending: Cardiology | Admitting: Cardiology

## 2022-01-30 ENCOUNTER — Ambulatory Visit (HOSPITAL_COMMUNITY): Payer: Self-pay | Admitting: Pharmacist

## 2022-01-30 ENCOUNTER — Encounter (HOSPITAL_COMMUNITY): Payer: Self-pay

## 2022-01-30 ENCOUNTER — Encounter (HOSPITAL_COMMUNITY): Payer: Self-pay | Admitting: *Deleted

## 2022-01-30 VITALS — BP 103/62 | HR 87 | Wt 174.8 lb

## 2022-01-30 DIAGNOSIS — I255 Ischemic cardiomyopathy: Secondary | ICD-10-CM | POA: Insufficient documentation

## 2022-01-30 DIAGNOSIS — I2510 Atherosclerotic heart disease of native coronary artery without angina pectoris: Secondary | ICD-10-CM | POA: Insufficient documentation

## 2022-01-30 DIAGNOSIS — I4892 Unspecified atrial flutter: Secondary | ICD-10-CM | POA: Insufficient documentation

## 2022-01-30 DIAGNOSIS — Z79899 Other long term (current) drug therapy: Secondary | ICD-10-CM | POA: Diagnosis not present

## 2022-01-30 DIAGNOSIS — Z8616 Personal history of COVID-19: Secondary | ICD-10-CM | POA: Insufficient documentation

## 2022-01-30 DIAGNOSIS — Z95811 Presence of heart assist device: Secondary | ICD-10-CM

## 2022-01-30 DIAGNOSIS — N186 End stage renal disease: Secondary | ICD-10-CM | POA: Insufficient documentation

## 2022-01-30 DIAGNOSIS — E1136 Type 2 diabetes mellitus with diabetic cataract: Secondary | ICD-10-CM | POA: Diagnosis not present

## 2022-01-30 DIAGNOSIS — Z951 Presence of aortocoronary bypass graft: Secondary | ICD-10-CM | POA: Insufficient documentation

## 2022-01-30 DIAGNOSIS — D631 Anemia in chronic kidney disease: Secondary | ICD-10-CM | POA: Insufficient documentation

## 2022-01-30 DIAGNOSIS — Z992 Dependence on renal dialysis: Secondary | ICD-10-CM | POA: Insufficient documentation

## 2022-01-30 DIAGNOSIS — Z4801 Encounter for change or removal of surgical wound dressing: Secondary | ICD-10-CM | POA: Diagnosis not present

## 2022-01-30 DIAGNOSIS — E1142 Type 2 diabetes mellitus with diabetic polyneuropathy: Secondary | ICD-10-CM | POA: Insufficient documentation

## 2022-01-30 DIAGNOSIS — Z7901 Long term (current) use of anticoagulants: Secondary | ICD-10-CM | POA: Diagnosis not present

## 2022-01-30 DIAGNOSIS — I132 Hypertensive heart and chronic kidney disease with heart failure and with stage 5 chronic kidney disease, or end stage renal disease: Secondary | ICD-10-CM | POA: Diagnosis not present

## 2022-01-30 DIAGNOSIS — M79641 Pain in right hand: Secondary | ICD-10-CM | POA: Insufficient documentation

## 2022-01-30 DIAGNOSIS — I5022 Chronic systolic (congestive) heart failure: Secondary | ICD-10-CM | POA: Diagnosis not present

## 2022-01-30 DIAGNOSIS — E1122 Type 2 diabetes mellitus with diabetic chronic kidney disease: Secondary | ICD-10-CM | POA: Diagnosis not present

## 2022-01-30 DIAGNOSIS — Z794 Long term (current) use of insulin: Secondary | ICD-10-CM | POA: Insufficient documentation

## 2022-01-30 DIAGNOSIS — I4891 Unspecified atrial fibrillation: Secondary | ICD-10-CM | POA: Diagnosis not present

## 2022-01-30 LAB — POCT INR: INR: 2.4 (ref 2.0–3.0)

## 2022-01-30 NOTE — Progress Notes (Signed)
LVAD INR 

## 2022-01-31 DIAGNOSIS — Z992 Dependence on renal dialysis: Secondary | ICD-10-CM | POA: Diagnosis not present

## 2022-01-31 DIAGNOSIS — N2581 Secondary hyperparathyroidism of renal origin: Secondary | ICD-10-CM | POA: Diagnosis not present

## 2022-01-31 DIAGNOSIS — N186 End stage renal disease: Secondary | ICD-10-CM | POA: Diagnosis not present

## 2022-02-02 DIAGNOSIS — N186 End stage renal disease: Secondary | ICD-10-CM | POA: Diagnosis not present

## 2022-02-02 DIAGNOSIS — I509 Heart failure, unspecified: Secondary | ICD-10-CM | POA: Diagnosis not present

## 2022-02-02 DIAGNOSIS — N2581 Secondary hyperparathyroidism of renal origin: Secondary | ICD-10-CM | POA: Diagnosis not present

## 2022-02-02 DIAGNOSIS — Z992 Dependence on renal dialysis: Secondary | ICD-10-CM | POA: Diagnosis not present

## 2022-02-05 ENCOUNTER — Ambulatory Visit (HOSPITAL_COMMUNITY): Payer: Self-pay

## 2022-02-05 ENCOUNTER — Other Ambulatory Visit (HOSPITAL_COMMUNITY): Payer: Self-pay | Admitting: Cardiology

## 2022-02-05 DIAGNOSIS — N186 End stage renal disease: Secondary | ICD-10-CM | POA: Diagnosis not present

## 2022-02-05 DIAGNOSIS — Z95811 Presence of heart assist device: Secondary | ICD-10-CM

## 2022-02-05 DIAGNOSIS — I951 Orthostatic hypotension: Secondary | ICD-10-CM

## 2022-02-05 DIAGNOSIS — Z7901 Long term (current) use of anticoagulants: Secondary | ICD-10-CM

## 2022-02-05 DIAGNOSIS — Z992 Dependence on renal dialysis: Secondary | ICD-10-CM | POA: Diagnosis not present

## 2022-02-05 DIAGNOSIS — N2581 Secondary hyperparathyroidism of renal origin: Secondary | ICD-10-CM | POA: Diagnosis not present

## 2022-02-05 DIAGNOSIS — K3184 Gastroparesis: Secondary | ICD-10-CM

## 2022-02-05 LAB — POCT INR: INR: 2.6 (ref 2.0–3.0)

## 2022-02-05 NOTE — Patient Instructions (Signed)
Description   INR slightly > goal. No dose change required. Instructed to continue current regimen of warfarin 4.5 mg daily.  Home INR machine - checks every Tuesday  Notes they have been pulling off more fluid in iHD lately. Does not eat greens iHD - if needs lovenox, dose is 40 mg daily for 2-3 days

## 2022-02-05 NOTE — Progress Notes (Signed)
LVAD INR 

## 2022-02-07 DIAGNOSIS — N2581 Secondary hyperparathyroidism of renal origin: Secondary | ICD-10-CM | POA: Diagnosis not present

## 2022-02-07 DIAGNOSIS — N186 End stage renal disease: Secondary | ICD-10-CM | POA: Diagnosis not present

## 2022-02-07 DIAGNOSIS — Z992 Dependence on renal dialysis: Secondary | ICD-10-CM | POA: Diagnosis not present

## 2022-02-09 DIAGNOSIS — N186 End stage renal disease: Secondary | ICD-10-CM | POA: Diagnosis not present

## 2022-02-09 DIAGNOSIS — N2581 Secondary hyperparathyroidism of renal origin: Secondary | ICD-10-CM | POA: Diagnosis not present

## 2022-02-09 DIAGNOSIS — Z992 Dependence on renal dialysis: Secondary | ICD-10-CM | POA: Diagnosis not present

## 2022-02-12 DIAGNOSIS — N186 End stage renal disease: Secondary | ICD-10-CM | POA: Diagnosis not present

## 2022-02-12 DIAGNOSIS — Z992 Dependence on renal dialysis: Secondary | ICD-10-CM | POA: Diagnosis not present

## 2022-02-12 DIAGNOSIS — N2581 Secondary hyperparathyroidism of renal origin: Secondary | ICD-10-CM | POA: Diagnosis not present

## 2022-02-13 ENCOUNTER — Ambulatory Visit (HOSPITAL_COMMUNITY)
Admission: RE | Admit: 2022-02-13 | Discharge: 2022-02-13 | Disposition: A | Discharge status: Home / Self Care | Payer: Medicare HMO | Source: Ambulatory Visit | Attending: Cardiology | Admitting: Cardiology

## 2022-02-13 ENCOUNTER — Ambulatory Visit (HOSPITAL_COMMUNITY): Payer: Self-pay | Admitting: Pharmacist

## 2022-02-13 DIAGNOSIS — I5042 Chronic combined systolic (congestive) and diastolic (congestive) heart failure: Secondary | ICD-10-CM | POA: Diagnosis not present

## 2022-02-13 DIAGNOSIS — Y838 Other surgical procedures as the cause of abnormal reaction of the patient, or of later complication, without mention of misadventure at the time of the procedure: Secondary | ICD-10-CM | POA: Diagnosis not present

## 2022-02-13 DIAGNOSIS — Y718 Miscellaneous cardiovascular devices associated with adverse incidents, not elsewhere classified: Secondary | ICD-10-CM | POA: Insufficient documentation

## 2022-02-13 DIAGNOSIS — Z95811 Presence of heart assist device: Secondary | ICD-10-CM

## 2022-02-13 DIAGNOSIS — T827XXA Infection and inflammatory reaction due to other cardiac and vascular devices, implants and grafts, initial encounter: Secondary | ICD-10-CM | POA: Diagnosis not present

## 2022-02-13 LAB — POCT INR: INR: 2 (ref 2.0–3.0)

## 2022-02-13 MED ORDER — DOXYCYCLINE HYCLATE 50 MG PO CAPS: 100.0000 mg | ORAL_CAPSULE | Freq: Two times a day (BID) | ORAL | 0 refills | Status: DC

## 2022-02-13 NOTE — Progress Notes (Signed)
Patient called VAD pager last night to report during dressing change, his son noticed bloody and "pus" drainage. Advised patient per Dr. Aundra Dubin to come to [facility] this morning for wound check.  Pt presents to [facility] with his brother [name]. Patient reports he has been wearing his abdominal binder "almost all the time" and does wear during sleep at night. He is also wearing 2 anchors.   His son has been changing dressing weekly, using gauze dressing due to patient "sweating a lot". They have not been using the silver strip due to skin irritation. His son changed last night and placed silver strip that one time (see below). Patient denies any fevers or chills or pain at exit site or along internal drive line.   Darrick Grinder, NP in to inspect wound. Wound culture obtained, Doxy ordered, and will refer patient to Infectious Disease per Amy. Pt verbalized understanding of above.   Exit Site Care: Dressing maintained weekly by pt's son. Gauze VAD dressing and anchor x 2 removed and site care performed using sterile technique. Drive line exit site cleaned with Chlora prep applicators x 2, allowed to dry before gauze dressing, and anchor x 2 re-applied. Exit site partially incorporated. The velour is fully implanted at exit site. No redness or tenderness noted. Small amount of bloody/tan drainage at site and on silver strip with slight foul odor. Tan/green drainage noted in wound bed with site tunneling 7 cm; wound obtained.   Pt denies fever or chills. Instructed to change dressing as often as needed to keep dressing clean and dry. Patient says he has adequate supplies at home.     Patient Instructions: Start Doxy 100 mg twice daily x 14 days. Avoid sun while on Doxy with increased risk of sun burn. Will refer you to Infectious Disease for chronic wound infections. Call if site worsens or does not improve.  Keep scheduled f/u appointment in [facility].   Zada Girt RN, [place]  Coordinator [phone]

## 2022-02-14 DIAGNOSIS — N186 End stage renal disease: Secondary | ICD-10-CM | POA: Diagnosis not present

## 2022-02-14 DIAGNOSIS — N2581 Secondary hyperparathyroidism of renal origin: Secondary | ICD-10-CM | POA: Diagnosis not present

## 2022-02-14 DIAGNOSIS — Z992 Dependence on renal dialysis: Secondary | ICD-10-CM | POA: Diagnosis not present

## 2022-02-16 DIAGNOSIS — Z992 Dependence on renal dialysis: Secondary | ICD-10-CM | POA: Diagnosis not present

## 2022-02-16 DIAGNOSIS — N186 End stage renal disease: Secondary | ICD-10-CM | POA: Diagnosis not present

## 2022-02-16 DIAGNOSIS — N2581 Secondary hyperparathyroidism of renal origin: Secondary | ICD-10-CM | POA: Diagnosis not present

## 2022-02-16 LAB — AEROBIC CULTURE W GRAM STAIN (SUPERFICIAL SPECIMEN)

## 2022-02-19 DIAGNOSIS — N186 End stage renal disease: Secondary | ICD-10-CM | POA: Diagnosis not present

## 2022-02-19 DIAGNOSIS — N2581 Secondary hyperparathyroidism of renal origin: Secondary | ICD-10-CM | POA: Diagnosis not present

## 2022-02-19 DIAGNOSIS — Z992 Dependence on renal dialysis: Secondary | ICD-10-CM | POA: Diagnosis not present

## 2022-02-20 ENCOUNTER — Ambulatory Visit (HOSPITAL_COMMUNITY): Payer: Self-pay | Admitting: Pharmacist

## 2022-02-20 LAB — POCT INR: INR: 2 (ref 2.0–3.0)

## 2022-02-21 DIAGNOSIS — N186 End stage renal disease: Secondary | ICD-10-CM | POA: Diagnosis not present

## 2022-02-21 DIAGNOSIS — Z992 Dependence on renal dialysis: Secondary | ICD-10-CM | POA: Diagnosis not present

## 2022-02-21 DIAGNOSIS — N2581 Secondary hyperparathyroidism of renal origin: Secondary | ICD-10-CM | POA: Diagnosis not present

## 2022-02-22 ENCOUNTER — Other Ambulatory Visit: Payer: Self-pay

## 2022-02-22 ENCOUNTER — Encounter: Payer: Self-pay | Admitting: Internal Medicine

## 2022-02-22 ENCOUNTER — Ambulatory Visit: Payer: Medicare HMO | Admitting: Internal Medicine

## 2022-02-22 VITALS — Temp 97.9°F | Wt 172.0 lb

## 2022-02-22 DIAGNOSIS — N186 End stage renal disease: Secondary | ICD-10-CM

## 2022-02-22 DIAGNOSIS — A4901 Methicillin susceptible Staphylococcus aureus infection, unspecified site: Secondary | ICD-10-CM

## 2022-02-22 DIAGNOSIS — T827XXA Infection and inflammatory reaction due to other cardiac and vascular devices, implants and grafts, initial encounter: Secondary | ICD-10-CM

## 2022-02-22 DIAGNOSIS — Z95811 Presence of heart assist device: Secondary | ICD-10-CM | POA: Diagnosis not present

## 2022-02-22 DIAGNOSIS — Z992 Dependence on renal dialysis: Secondary | ICD-10-CM

## 2022-02-22 MED ORDER — CEPHALEXIN 500 MG PO CAPS: 500.0000 mg | ORAL_CAPSULE | Freq: Two times a day (BID) | ORAL | 0 refills | Status: DC

## 2022-02-22 NOTE — Patient Instructions (Signed)
Thank you for coming to see me today. It was a pleasure seeing you.  To Do: Stop taking doxycycline Start taking keflex '500mg'$  twice daily Follow up in 6 weeks Sooner if needed.  If you have any questions or concerns, please do not hesitate to call the office at [phone].  Take Care,   Jule Ser

## 2022-02-22 NOTE — Progress Notes (Signed)
[facility]  Reason for Consult: Driveline infection  Referring Provider: Dr Aundra Dubin   HPI:    Marcus Johnson. is a 61 y.o. male with PMHx as below who presents to the clinic for an LVAD driveline infection.   Patient has a complicated past medical hx of DM2, CAD s/p CABG, ischemic cardiomyopathy s/p LVAD placed in March 2022, ESRD on HD via tunneled catheter.  He was seen in the [facility] 02/13/22 after noticing blood and pus drainage from his site.  Patient had wound culture obtained at that time and was referred to our clinic in addition to being started on doxycycline.  He was noted to have tan/green drainage from wound bed at that time with site tunneling 7 cm.  He has been on doxycycline since that time and reports improvement but still has some bleeding/drainage.  He has no fevers, chills.  The Doxycycline is causing some GI disturbance.  His cultures have grown MSSA.  He also grew MSSA from a driveline wound culture in February 2023.  Patient's Medications  New Prescriptions   CEPHALEXIN (KEFLEX) 500 MG CAPSULE    Take 1 capsule (500 mg total) by mouth 2 (two) times daily. Take evening dose after dialysis on HD days.  Previous Medications   ATORVASTATIN (LIPITOR) 40 MG TABLET    Take 1 tablet (40 mg total) by mouth daily.   GABAPENTIN (NEURONTIN) 100 MG CAPSULE    Take 1-2 capsules (100-200 mg total) by mouth See admin instructions. Taking 1 capsule ( 100 mg) in the AM and 2 capsules ( 200 mg) at bedtime   MIDODRINE (PROAMATINE) 10 MG TABLET    Take 1 tablet (10 mg total) by mouth 3 (three) times daily. Three times a day on dialysis days; NO midodrine on NON dialysis days   MULTIVITAMIN (RENA-VIT) TABS TABLET    TAKE 1 TABLET BY MOUTH EVERYDAY AT BEDTIME   NAPHAZOLINE-GLYCERIN (CLEAR EYES REDNESS) 0.012-0.25 % SOLN    Place 1-2 drops into the right eye 4 (four) times daily as needed for eye irritation.   PANTOPRAZOLE (PROTONIX) 40 MG TABLET     Take 1 tablet (40 mg total) by mouth daily.   ROSUVASTATIN (CRESTOR) 10 MG TABLET    TAKE 1 TABLET BY MOUTH EVERY DAY   TRAMADOL (ULTRAM) 50 MG TABLET    Take 1 tablet (50 mg total) by mouth every 12 (twelve) hours as needed.   WARFARIN (COUMADIN) 3 MG TABLET    TAKE 1 TABLET BY MOUTH DAILY OR AS DIRECTED  Modified Medications   No medications on file  Discontinued Medications   DOXYCYCLINE (VIBRAMYCIN) 50 MG CAPSULE    Take 2 capsules (100 mg total) by mouth 2 (two) times daily for 14 days.      Past Medical History:  Diagnosis Date   AICD (automatic cardioverter/defibrillator) present    Single-chamber  implantable cardiac defibrillator - [employer]   Atrial fibrillation (HCC)    Cataract    Mixed form OU   CHF (congestive heart failure) (HCC)    Chronic kidney disease    Chronic kidney disease (CKD), stage III (moderate) (HCC)    Chronic systolic heart failure ([place])    a. Echo 5/13: Mild LVE, mild LVH, EF 10%, anteroseptal, lateral, apical AK, mild MR, mild LAE, moderately reduced RVSF, mild RAE, PASP 60;  b. 07/2014 Echo: EF 20-2%, diff HK, AKI of antsep/apical/mid-apicalinferior, mod reduced RV.   Coronary artery disease  a. s/p CABG 2002 b. LHC 5/13:  dLM 80%, LAD subtotally occluded, pCFX occluded, pRCA 50%, mid? Occlusion with high take off of the PDA with 70% multiple lesions-not bypassed and supplies collaterals to LAD, LIMA-IM/ramus ok, S-OM ok, S-PLA branches ok. Medical therapy was recommended   COVID    Diabetic retinopathy ([place])    NPDR OD, PDR OS   Dyspnea    Gout    "on daily RX" (01/08/2018)   Hypertension    Hypertensive retinopathy    OU   Ischemic cardiomyopathy    a. Echo 5/13: Mild LVE, mild LVH, EF 10%, anteroseptal, lateral, apical AK, mild MR, mild LAE, moderately reduced RVSF, mild RAE, PASP 60;  b. 01/2012 s/p MDT [id number] Protecta XT VR AICD;  c. 07/2014 Echo: EF 20-2%, diff HK, AKI of antsep/apical/mid-apicalinferior, mod reduced RV.   MRSA (methicillin  resistant Staphylococcus aureus)    Status post right foot plantar deep infection with MRSA status post  I&D 10/2008   Myocardial infarction [employer])    "was told I'd had several before heart OR in 2002" (01/08/2018)   Noncompliance    Peripheral neuropathy    Retinopathy, diabetic, background ([place])    Syncope    Type II diabetes mellitus ([place])    requiring insulin    Vitreous hemorrhage, left (HCC)    and proliferative diabetic retinopathy    Social History   Tobacco Use   Smoking status: Never   Smokeless tobacco: Never  Vaping Use   Vaping Use: Never used  Substance Use Topics   Alcohol use: No    Alcohol/week: 0.0 standard drinks of alcohol   Drug use: No    Family History  Problem Relation Age of Onset   Diabetes Father        died in his 49's   Hypertension Mother        died in her 19's - had a ppm.   Amblyopia Neg Hx    Blindness Neg Hx    Cataracts Neg Hx    Glaucoma Neg Hx    Macular degeneration Neg Hx    Retinal detachment Neg Hx    Strabismus Neg Hx    Retinitis pigmentosa Neg Hx     Allergies  Allergen Reactions   Meclizine Hcl Anaphylaxis and Swelling   Ivabradine Nausea Only    Review of Systems  Constitutional:  Negative for chills and fever.  Respiratory: Negative.    Cardiovascular:        + Bleeding/drainage from driveline site  Gastrointestinal:  Positive for nausea.      OBJECTIVE:    Vitals:   02/22/22 0929  Temp: 97.9 F (36.6 C)  TempSrc: Oral  Weight: 172 lb (78 kg)     Body mass index is 24.68 kg/m.  Physical Exam Constitutional:      General: He is not in acute distress.    Appearance: Normal appearance.  HENT:     Head: Normocephalic and atraumatic.  Pulmonary:     Effort: Pulmonary effort is normal. No respiratory distress.  Abdominal:     Comments: Driveline in place and site is covered with dressings intact and no strike through.   Musculoskeletal:        General: Normal range of motion.  Skin:    General:  Skin is warm and dry.     Findings: No rash.  Neurological:     General: No focal deficit present.     Mental Status: He is alert and  oriented to person, place, and time.  Psychiatric:        Mood and Affect: Mood normal.        Behavior: Behavior normal.      Labs and Microbiology:     Latest Ref Rng & Units 11/08/2021   12:48 AM 11/07/2021    1:10 AM 11/06/2021    1:11 AM  CBC  WBC 4.0 - 10.5 K/uL 7.7  7.2  7.0   Hemoglobin 13.0 - 17.0 g/dL 9.4  9.8  9.7   Hematocrit 39.0 - 52.0 % 28.0  29.9  29.3   Platelets 150 - 400 K/uL 157  153  155       Latest Ref Rng & Units 11/08/2021   12:48 AM 11/07/2021    1:10 AM 11/06/2021    1:11 AM  CMP  Glucose 70 - 99 mg/dL 96  111  121   BUN 6 - 20 mg/dL 42  23  45   Creatinine 0.61 - 1.24 mg/dL 4.10  2.97  5.02   Sodium 135 - 145 mmol/L 135  138  133   Potassium 3.5 - 5.1 mmol/L 4.2  3.9  3.8   Chloride 98 - 111 mmol/L 101  103  99   CO2 22 - 32 mmol/L '26  27  24   '$ Calcium 8.9 - 10.3 mg/dL 9.5  9.1  9.2      Recent Results (from the past 240 hour(s))  Aerobic Culture w Gram Stain (superficial specimen)     Status: None   Collection Time: 02/13/22 11:10 AM   Specimen: Wound  Result Value Ref Range Status   Specimen Description WOUND  Final   Special Requests left ventricular divice  Final   Gram Stain   Final    FEW WBC PRESENT, PREDOMINANTLY PMN RARE GRAM POSITIVE COCCI Performed at [facility], [address] [place], [place] [address]    Culture FEW STAPHYLOCOCCUS AUREUS  Final   Report Status 02/16/2022 FINAL  Final   Organism ID, Bacteria STAPHYLOCOCCUS AUREUS  Final      Susceptibility   Staphylococcus aureus - MIC*    CIPROFLOXACIN <=0.5 SENSITIVE Sensitive     ERYTHROMYCIN <=0.25 SENSITIVE Sensitive     GENTAMICIN <=0.5 SENSITIVE Sensitive     OXACILLIN <=0.25 SENSITIVE Sensitive     TETRACYCLINE <=1 SENSITIVE Sensitive     VANCOMYCIN 1 SENSITIVE Sensitive     TRIMETH/SULFA <=10 SENSITIVE Sensitive      CLINDAMYCIN <=0.25 SENSITIVE Sensitive     RIFAMPIN <=0.5 SENSITIVE Sensitive     Inducible Clindamycin NEGATIVE Sensitive     * FEW STAPHYLOCOCCUS AUREUS     ASSESSMENT & PLAN:    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 Cultures have grown MSSA and will change from doxycycline to Keflex 500 mg every 12 hours dosed for his HD schedule.  Advised to take Keflex after HD on his dialysis days and he verbalized understanding.  Given the wound with 7cm of tunneling noted he may benefit from CVTS evaluation and debridement in the future, however, for now he would prefer to avoid surgery and see how he responds to antibiotic therapy.  Anticipate that he will be on antibiotics indefinitely and discussed with patient that once drive line or prosthetic material is infected it will presumably always be the case (especially with MSSA) unless explanted which is often times prohibitive particularly in this case. RTC in ~  6 weeks.        Raynelle Highland for Infectious Disease [facility] 02/22/2022, 10:07 AM  I have personally spent 45 minutes involved in face-to-face and non-face-to-face activities for this patient on the day of the visit. Professional time spent includes the following activities: Preparing to see the patient (review of tests), Obtaining and/or reviewing separately obtained history (admission/discharge record), Performing a medically appropriate examination and/or evaluation , Ordering medications/tests/procedures, referring and communicating with other health care professionals, Documenting clinical information in the EMR, Independently interpreting results (not separately reported), Communicating results to the patient/family/caregiver, Counseling and educating the patient/family/caregiver and Care coordination (not separately reported).

## 2022-02-22 NOTE — Assessment & Plan Note (Addendum)
Discussed with patient findings of LVAD and driveline infection. Cultures have grown MSSA and will change from doxycycline to Keflex 500 mg every 12 hours dosed for his HD schedule.  Advised to take Keflex after HD on his dialysis days and he verbalized understanding.  Given the wound with 7cm of tunneling noted he may benefit from CVTS evaluation and debridement in the future, however, for now he would prefer to avoid surgery and see how he responds to antibiotic therapy.  Anticipate that he will be on antibiotics indefinitely and discussed with patient that once drive line or prosthetic material is infected it will presumably always be the case (especially with MSSA) unless explanted which is often times prohibitive particularly in this case. RTC in ~ 6 weeks.

## 2022-02-23 DIAGNOSIS — N2581 Secondary hyperparathyroidism of renal origin: Secondary | ICD-10-CM | POA: Diagnosis not present

## 2022-02-23 DIAGNOSIS — Z992 Dependence on renal dialysis: Secondary | ICD-10-CM | POA: Diagnosis not present

## 2022-02-23 DIAGNOSIS — N186 End stage renal disease: Secondary | ICD-10-CM | POA: Diagnosis not present

## 2022-02-26 DIAGNOSIS — Z992 Dependence on renal dialysis: Secondary | ICD-10-CM | POA: Diagnosis not present

## 2022-02-26 DIAGNOSIS — N186 End stage renal disease: Secondary | ICD-10-CM | POA: Diagnosis not present

## 2022-02-26 DIAGNOSIS — N2581 Secondary hyperparathyroidism of renal origin: Secondary | ICD-10-CM | POA: Diagnosis not present

## 2022-02-27 ENCOUNTER — Ambulatory Visit (HOSPITAL_COMMUNITY): Payer: Self-pay | Admitting: Pharmacist

## 2022-02-27 ENCOUNTER — Telehealth: Payer: Self-pay

## 2022-02-27 DIAGNOSIS — Z7901 Long term (current) use of anticoagulants: Secondary | ICD-10-CM | POA: Diagnosis not present

## 2022-02-27 LAB — POCT INR: INR: 1.9 — AB (ref 2.0–3.0)

## 2022-02-27 NOTE — Patient Outreach (Signed)
  Care Management   Outreach Note  02/27/2022 Name: Marcus Johnson. MRN: [id number] DOB: 10-Aug-1960  An unsuccessful telephone outreach was attempted today. The patient was referred to the case management team for assistance with care management and care coordination.   Follow Up Plan:  The care management team will reach out to the patient again over the next 7 days.   Daneen Schick, BSW, CDP Social Worker, Certified Dementia Practitioner Care Coordination [phone]

## 2022-02-28 ENCOUNTER — Encounter (HOSPITAL_COMMUNITY): Payer: Self-pay

## 2022-02-28 ENCOUNTER — Other Ambulatory Visit (HOSPITAL_COMMUNITY): Payer: Self-pay | Admitting: *Deleted

## 2022-02-28 ENCOUNTER — Ambulatory Visit (HOSPITAL_COMMUNITY)
Admission: RE | Admit: 2022-02-28 | Discharge: 2022-02-28 | Disposition: A | Discharge status: Home / Self Care | Payer: Medicare HMO | Source: Ambulatory Visit | Attending: Internal Medicine | Admitting: Internal Medicine

## 2022-02-28 ENCOUNTER — Telehealth: Payer: Self-pay

## 2022-02-28 DIAGNOSIS — I255 Ischemic cardiomyopathy: Secondary | ICD-10-CM | POA: Diagnosis not present

## 2022-02-28 DIAGNOSIS — I4891 Unspecified atrial fibrillation: Secondary | ICD-10-CM | POA: Diagnosis not present

## 2022-02-28 DIAGNOSIS — Z9889 Other specified postprocedural states: Secondary | ICD-10-CM | POA: Diagnosis not present

## 2022-02-28 DIAGNOSIS — Z992 Dependence on renal dialysis: Secondary | ICD-10-CM | POA: Insufficient documentation

## 2022-02-28 DIAGNOSIS — R110 Nausea: Secondary | ICD-10-CM | POA: Insufficient documentation

## 2022-02-28 DIAGNOSIS — I2510 Atherosclerotic heart disease of native coronary artery without angina pectoris: Secondary | ICD-10-CM | POA: Diagnosis not present

## 2022-02-28 DIAGNOSIS — D649 Anemia, unspecified: Secondary | ICD-10-CM | POA: Insufficient documentation

## 2022-02-28 DIAGNOSIS — Z792 Long term (current) use of antibiotics: Secondary | ICD-10-CM | POA: Diagnosis not present

## 2022-02-28 DIAGNOSIS — Z951 Presence of aortocoronary bypass graft: Secondary | ICD-10-CM | POA: Insufficient documentation

## 2022-02-28 DIAGNOSIS — I5022 Chronic systolic (congestive) heart failure: Secondary | ICD-10-CM | POA: Diagnosis not present

## 2022-02-28 DIAGNOSIS — Z4502 Encounter for adjustment and management of automatic implantable cardiac defibrillator: Secondary | ICD-10-CM | POA: Insufficient documentation

## 2022-02-28 DIAGNOSIS — I4892 Unspecified atrial flutter: Secondary | ICD-10-CM | POA: Diagnosis not present

## 2022-02-28 DIAGNOSIS — E1122 Type 2 diabetes mellitus with diabetic chronic kidney disease: Secondary | ICD-10-CM | POA: Diagnosis not present

## 2022-02-28 DIAGNOSIS — N186 End stage renal disease: Secondary | ICD-10-CM | POA: Diagnosis not present

## 2022-02-28 DIAGNOSIS — Z95811 Presence of heart assist device: Secondary | ICD-10-CM | POA: Diagnosis not present

## 2022-02-28 DIAGNOSIS — I132 Hypertensive heart and chronic kidney disease with heart failure and with stage 5 chronic kidney disease, or end stage renal disease: Secondary | ICD-10-CM | POA: Insufficient documentation

## 2022-02-28 DIAGNOSIS — Z95810 Presence of automatic (implantable) cardiac defibrillator: Secondary | ICD-10-CM | POA: Diagnosis not present

## 2022-02-28 DIAGNOSIS — N2581 Secondary hyperparathyroidism of renal origin: Secondary | ICD-10-CM | POA: Diagnosis not present

## 2022-02-28 DIAGNOSIS — Z7901 Long term (current) use of anticoagulants: Secondary | ICD-10-CM | POA: Insufficient documentation

## 2022-02-28 DIAGNOSIS — G540 Brachial plexus disorders: Secondary | ICD-10-CM | POA: Diagnosis not present

## 2022-02-28 DIAGNOSIS — Z8616 Personal history of COVID-19: Secondary | ICD-10-CM | POA: Insufficient documentation

## 2022-02-28 DIAGNOSIS — Z794 Long term (current) use of insulin: Secondary | ICD-10-CM | POA: Diagnosis not present

## 2022-02-28 DIAGNOSIS — I252 Old myocardial infarction: Secondary | ICD-10-CM | POA: Diagnosis not present

## 2022-02-28 DIAGNOSIS — Z79899 Other long term (current) drug therapy: Secondary | ICD-10-CM | POA: Diagnosis not present

## 2022-02-28 DIAGNOSIS — I472 Ventricular tachycardia, unspecified: Secondary | ICD-10-CM | POA: Diagnosis not present

## 2022-02-28 DIAGNOSIS — B9561 Methicillin susceptible Staphylococcus aureus infection as the cause of diseases classified elsewhere: Secondary | ICD-10-CM | POA: Diagnosis not present

## 2022-02-28 LAB — CBC
HCT: 34.1 % — ABNORMAL LOW (ref 39.0–52.0)
Hemoglobin: 11.3 g/dL — ABNORMAL LOW (ref 13.0–17.0)
MCH: 30.2 pg (ref 26.0–34.0)
MCHC: 33.1 g/dL (ref 30.0–36.0)
MCV: 91.2 fL (ref 80.0–100.0)
Platelets: 166 10*3/uL (ref 150–400)
RBC: 3.74 MIL/uL — ABNORMAL LOW (ref 4.22–5.81)
RDW: 14.3 % (ref 11.5–15.5)
WBC: 7.7 10*3/uL (ref 4.0–10.5)
nRBC: 0 % (ref 0.0–0.2)

## 2022-02-28 LAB — PROTIME-INR
INR: 1.8 — ABNORMAL HIGH (ref 0.8–1.2)
Prothrombin Time: 21 seconds — ABNORMAL HIGH (ref 11.4–15.2)

## 2022-02-28 LAB — COMPREHENSIVE METABOLIC PANEL
ALT: 23 U/L (ref 0–44)
AST: 38 U/L (ref 15–41)
Albumin: 3.9 g/dL (ref 3.5–5.0)
Alkaline Phosphatase: 109 U/L (ref 38–126)
Anion gap: 13 (ref 5–15)
BUN: 67 mg/dL — ABNORMAL HIGH (ref 8–23)
CO2: 27 mmol/L (ref 22–32)
Calcium: 9.2 mg/dL (ref 8.9–10.3)
Chloride: 99 mmol/L (ref 98–111)
Creatinine, Ser: 6.8 mg/dL — ABNORMAL HIGH (ref 0.61–1.24)
GFR, Estimated: 9 mL/min — ABNORMAL LOW (ref >60)
Glucose, Bld: 192 mg/dL — ABNORMAL HIGH (ref 70–99)
Potassium: 4.2 mmol/L (ref 3.5–5.1)
Sodium: 139 mmol/L (ref 135–145)
Total Bilirubin: 0.7 mg/dL (ref 0.3–1.2)
Total Protein: 7.7 g/dL (ref 6.5–8.1)

## 2022-02-28 LAB — LACTATE DEHYDROGENASE: LDH: 235 U/L — ABNORMAL HIGH (ref 98–192)

## 2022-02-28 LAB — MAGNESIUM: Magnesium: 2 mg/dL (ref 1.7–2.4)

## 2022-02-28 NOTE — Telephone Encounter (Signed)
Patient going to HF clinic to have labs prior to dialysis, MDT transmission faxed to heart failure clinic, per SK patient got appropriate shock for VT/ VT started below detection, MDT contacted to meet patient at HF clinic to reprogram detection rate

## 2022-02-28 NOTE — Patient Instructions (Signed)
No medication changes Coumadin dosing per Ander Purpura PharmD Return to [place] clinic for previously scheduled appointment May change drive line dressing every other day if drainage improves. Otherwise change dressing daily.

## 2022-02-28 NOTE — Progress Notes (Addendum)
Patient presents for sick visit in [facility] today with brother [name]). Reports no problems with VAD equipment or concerns with drive line.  Received page from pt this morning reporting he was shocked by his ICD. Prior to shock pt felt fine, no lightheadedness or dizziness. He was sitting on the side of his bed at time of shock. Remote transmission sent to device clinic. Device attempted to ATP x 3 unsuccessfully. Pt received 1 shock appropriately for VT 180 to > 200 BPM. Per Dr Caryl Comes device rep will adjust VT settings.   Patient arrived via wheelchair today. Denies lightheadedness/dizziness on non-HD days, falls, heart failure symptoms, and signs of bleeding.   Medtronic rep to bedside. Decreased VT detection to 182 bpm, and ATP sequence RSI interval to 84% per Dr Caryl Comes.   Per Dr Aundra Dubin since this is pt's first episode of VT, will hold off on starting Amiodarone for now.  Patient reports he continues M/W/F dialysis and that is dry weight is 74 kg. Pt confirms he takes Midodrine 10 mg TID on HD days only. Reports lightheadedness/dizziness after dialysis treatments has improved some but he does still have episodes occasionally. I spoke with Caren Griffins RN at [facility] about shock this morning. Will fax labs from today to them once resulted.   Pt had appt with Dr Juleen China ID on 02/22/22. Changed antibiotic from Doxy to Keflex 500 mg BID (dosed for HD schedule). Pt would like to avoid surgery if possible. [name] reports they are changing dressing every 3 days. Dressing changed today in clinic. Currently wearing 2 anchors. Reports he wears binder at home, but does not like to wear during dialysis. See documentation below for dressing change details. Site tunneling 5 cm. Discussed need for Dr Prescott Gum to see pt to discuss debridement. Pt in agreement to discuss possible surgical options with surgeon. Discussed need to change dressing every other day, and if drainage continues change dressing daily. Both pt and  [name] verbalized understanding.    Vital Signs:  Temp: 97.8 Doppler Pressure: 130 Automatc BP: 122/91 (104) (took Midodrine this morning for HD) HR: 93 SR O2: UTO % RA   Weight: did not weight today lb w/ eqt  Last wt: 174.8 lb   VAD Indication: Destination Therapy due to CKD Stage 3b   LVAD assessment:  HM III:: Speed: 5800 rpms Flow: 5.2 Power: 4.8 w    PI: 3.4 Alarms: few low voltage Events: 10 PI events so far today; 60+ PI events on HD days  Fixed speed:  5200 Low speed limit:  4900  Primary controller: back up battery due for replacement in 16 months Secondary controller:  back up battery due for replacement in 25 months  I reviewed the LVAD parameters from today and compared the results to the patient's prior recorded data. LVAD interrogation was NEGATIVE for significant power changes, NEGATIVE for clinical alarms and STABLE for PI events/speed drops. No programming changes were made and pump is functioning within specified parameters. Pt is performing daily controller and system monitor self tests along with completing weekly and monthly maintenance for LVAD equipment.   LVAD equipment check completed and is in good working order. Back-up equipment present.    Annual Equipment Maintenance on UBC/PM was performed today 10/12/21.  Exit Site Care: Dressing maintained weekly by pt's son. Gauze VAD dressing and anchor removed and site care performed using sterile technique. Drive line exit site cleaned with Chlora prep applicators x 2, allowed to dry before gauze dressing NO  SILVER STRIP applied. Exit site partially incorporated. The velour is fully implanted at exit site. Slight  redness, no tenderness, or foul odor noted. Small amount of brown drainage at site. Site tunnels 5 cm. Hypergranulation tissue at exit site. Silver nitrate to bleeding hypergranulation tissue; hemostasis achieved. Drive line anchor re-applied x 2. Pt denies fever or chills. Instructed to change  dressing every other day if drainage improves, if not, change daily. Provided with 21 daily dressing kits and 20 anchors for home use.     Device: Medtronic single Therapies: on 150 bpm Pacing: VVI 40 Last check:  07/12/21   BP & Labs:  Doppler 130 - Doppler is reflecting modified systolic   Hgb 95.0 - No S/S of bleeding. Specifically denies melena/BRBPR or nosebleeds.   LDH 235 - established baseline of 160 - 180. Denies tea-colored urine. No power elevations noted on interrogation.   K- 4.2  Mag- 2.0  Patient Instructions: No medication changes Coumadin dosing per Lauren PharmD Return to [facility] for previously scheduled appointment May change drive line dressing every other day if drainage improves. Otherwise change dressing daily.   Emerson Monte RN VAD Coordinator  Office: [phone]  24/7 Pager: [phone]    Follow up for Heart Failure/LVAD:  Ruven Corradi. is a 61 y.o. male who has a h/o DM2, CAD s/p CABG 5397, systolic HF due to ischemic cardiomyopathy with EF 20-25% (echo 12/15), DM2 and CKD. He is s/p Medtronic single chamber ICD.   Admitted in 12/15 due to ADHF. Required short course milrinone for diuresis. Diuresed 30 pounds.    CPX 2/16 showed severely reduced functional capacity.  There was a significant disconnect between his symptoms and the CPX.  RHC in 6/16 showed fairly normal filling pressures and low but not markedly low cardiac output.    CPX (4/19) was submaximal but suggested severe limitation from HF.    He was admitted in 6/19 with marked volume overload and dyspnea.  Echo in 6/19 showed EF 20% with severe global hypokinesis.  LHC/RHC showed severe native vessel CAD with patent LIMA-D, SVG-OM, and SVG-PLV; cardiac index low.  He was started on milrinone and diuresed.  We discussed LVAD extensively in light of low output, creatinine that is trending up and concern for decompensation of RV over time. He was adamant that he did not  want to pursue LVAD workup yet and did not want a referral for transplant evaluation. Milrinone was weaned off and he was discharged home.    Admitted 08/01/18-08/07/18 with volume overload and left foot wound. Diuresed 38 lbs with IV lasix and then transitioned back to torsemide 100 mg BID. ID was consulted with concerns for left foot osteomyelitis noted on CT scan. He got IV ancef q8 hours x 42 days. [facility]HC provided teaching and supplies for home antibiotics. Course complicated by AKI. DC weight 200 lbs.    He went into atrial flutter and had DCCV to NSR in 1/20.    Echo in 4/21 showed EF 25% with apical akinesis and diffuse hypokinesis relatively preserved in lateral wall, mildly decreased RV systolic function, PASP 41 mmHg. CPX in 5/21 showed severe functional limitation suggestive of advanced HF. 5/21 Zio patch showed short atrial flutter runs, 3.8% PVCs.    He developed COVID-19 infection in 1/22.  Symptoms were predominantly GI.    He developed progressive worsening of CHF in early 2022 and was admitted in 3/22 with low output HF.  Impella 5.5 was placed as bridge to  Heartmate III LVAD placed in 10/13/20.  He developed post-op renal failure and ended up on dialysis.  He was markedly deconditioned post-op and went to CIR.    Still has pain and minimal function in his right hand.  Nerve conduction study showed a chronic sensorimotor neuropathy in the right arm.    Ramp echo in 3/23 showed severe LV dysfunction with mild RV enlargement and mild dysfunction, mild-moderate AI.  Speed was increased to 5800 rpm.  The aortic valve still opened with every beat but only slightly. Flow increased from 4.1 to 5.3 L/min and PI remained stable.   Patient was admitted in 4/23 with falls and lightheadedness. He fractured his left foot when falling and initially was unable to walk.  He was thought to be hypotensive due to over-diuresis and dry weight was increased.  Midodrine on HD days was also increased.  He was  treated for a driveline infection with vancomycin then doxycycline, now off.   Patient returns today for followup of CHF/LVAD.  He had an ICD shock this morning.  Prior to the shock, he was not lightheaded.  Device was interrogated today, VT with 1 appropriate shock.  He has been in AF about 12.7% of the time.  He is stable symptomatically otherwise.  Appetite has been better recently.  Generally, no dyspnea walking on flat ground.  Still very weak after HD, using midodrine on HD days.  No severe lightheadedness or falls.   I reviewed the LVAD parameters from today, and compared the results to the patient's prior recorded data.  No programming changes were made.  The LVAD is functioning within specified parameters.  The patient performs LVAD self-test daily.  LVAD interrogation was negative for any significant power changes, alarms or PI events/speed drops.  LVAD equipment check completed and is in good working order.  Back-up equipment present.   LVAD education done on emergency procedures and precautions and reviewed exit site care.  Labs (5/22): LDH 174, hgb 10.6 Labs (7/22): LDH 164 Labs (8/22): hgb 11.4 Labs (10/22): hgb 12 Labs (11/22): hgb 12.2 Labs (4/23): LDL 163, hgb 10.4 Labs (6/23): hgb 12.2, LDH 284   Past Medical History:  Diagnosis Date   AICD (automatic cardioverter/defibrillator) present    Single-chamber  implantable cardiac defibrillator - Medtronic   Atrial fibrillation (HCC)    Cataract    Mixed form OU   CHF (congestive heart failure) (HCC)    Chronic kidney disease    Chronic kidney disease (CKD), stage III (moderate) (HCC)    Chronic systolic heart failure ([place])    a. Echo 5/13: Mild LVE, mild LVH, EF 10%, anteroseptal, lateral, apical AK, mild MR, mild LAE, moderately reduced RVSF, mild RAE, PASP 60;  b. 07/2014 Echo: EF 20-2%, diff HK, AKI of antsep/apical/mid-apicalinferior, mod reduced RV.   Coronary artery disease    a. s/p CABG 2002 b. LHC 5/13:  dLM 80%, LAD  subtotally occluded, pCFX occluded, pRCA 50%, mid? Occlusion with high take off of the PDA with 70% multiple lesions-not bypassed and supplies collaterals to LAD, LIMA-IM/ramus ok, S-OM ok, S-PLA branches ok. Medical therapy was recommended   COVID    Diabetic retinopathy ([place])    NPDR OD, PDR OS   Dyspnea    Gout    "on daily RX" (01/08/2018)   Hypertension    Hypertensive retinopathy    OU   Ischemic cardiomyopathy    a. Echo 5/13: Mild LVE, mild LVH, EF 10%, anteroseptal, lateral, apical AK, mild  MR, mild LAE, moderately reduced RVSF, mild RAE, PASP 60;  b. 01/2012 s/p MDT [id number] Protecta XT VR AICD;  c. 07/2014 Echo: EF 20-2%, diff HK, AKI of antsep/apical/mid-apicalinferior, mod reduced RV.   MRSA (methicillin resistant Staphylococcus aureus)    Status post right foot plantar deep infection with MRSA status post  I&D 10/2008   Myocardial infarction [facility])    "was told I'd had several before heart OR in 2002" (01/08/2018)   Noncompliance    Peripheral neuropathy    Retinopathy, diabetic, background ([place])    Syncope    Type II diabetes mellitus ([place])    requiring insulin    Vitreous hemorrhage, left (HCC)    and proliferative diabetic retinopathy    Current Outpatient Medications  Medication Sig Dispense Refill   atorvastatin (LIPITOR) 40 MG tablet Take 1 tablet (40 mg total) by mouth daily.     cephALEXin (KEFLEX) 500 MG capsule Take 1 capsule (500 mg total) by mouth 2 (two) times daily. Take evening dose after dialysis on HD days. 120 capsule 0   gabapentin (NEURONTIN) 100 MG capsule Take 1-2 capsules (100-200 mg total) by mouth See admin instructions. Taking 1 capsule ( 100 mg) in the AM and 2 capsules ( 200 mg) at bedtime 90 capsule 3   midodrine (PROAMATINE) 10 MG tablet Take 1 tablet (10 mg total) by mouth 3 (three) times daily. Three times a day on dialysis days; NO midodrine on NON dialysis days (Patient taking differently: Take 10 mg by mouth 3 (three) times daily. Three times a  day on dialysis days; Mon, Wed, Friday   NO midodrine on NON dialysis days) 90 tablet 6   multivitamin (RENA-VIT) TABS tablet TAKE 1 TABLET BY MOUTH EVERYDAY AT BEDTIME 90 tablet 3   pantoprazole (PROTONIX) 40 MG tablet Take 1 tablet (40 mg total) by mouth daily. 90 tablet 3   rosuvastatin (CRESTOR) 10 MG tablet TAKE 1 TABLET BY MOUTH EVERY DAY 90 tablet 3   traMADol (ULTRAM) 50 MG tablet Take 1 tablet (50 mg total) by mouth every 12 (twelve) hours as needed. 30 tablet 0   warfarin (COUMADIN) 3 MG tablet TAKE 1 TABLET BY MOUTH DAILY OR AS DIRECTED 90 tablet 5   naphazoline-glycerin (CLEAR EYES REDNESS) 0.012-0.25 % SOLN Place 1-2 drops into the right eye 4 (four) times daily as needed for eye irritation. (Patient not taking: Reported on 01/30/2022)     No current facility-administered medications for this encounter.    Meclizine hcl and Ivabradine  REVIEW OF SYSTEMS: All systems negative except as listed in HPI, PMH and Problem list.   LVAD INTERROGATION:  Please see LVAD nurse's note above.   BP (!) 122/91 Comment: map 104. took Midodrine this morning  Pulse 93 Comment: SR  Physical Exam: General: Well appearing this am. NAD.  HEENT: Normal. Neck: Supple, JVP 7-8 cm. Carotids OK.  Cardiac:  Mechanical heart sounds with LVAD hum present.  Lungs:  CTAB, normal effort.  Abdomen:  NT, ND, no HSM. No bruits or masses. +BS  LVAD exit site: Well-healed and incorporated. Dressing dry and intact. No erythema or drainage. Stabilization device present and accurately applied. Driveline dressing changed daily per sterile technique. Extremities:  Warm and dry. No cyanosis, clubbing, rash, or edema.  Neuro:  Alert & oriented x 3. Cranial nerves grossly intact. Moves all 4 extremities w/o difficulty. Affect pleasant    ASSESSMENT AND PLAN:  1. Chronic systolic CHF:  Ischemic cardiomyopathy. Has Medtronic ICD. Low output  HF, admitted and Impella 5.5 placed 10/04/20.  HM3 LVAD was placed on 10/13/20.  Developed post-op renal failure requiring eventual hemodialysis.  Ramp echo in 3/23, speed increased to 5800 rpm under echo guidance with increase in flow and stable PI.  MAP stable.  Not volume overloaded.  NYHA class II.  No falls or syncope, still weak after HD.   - Continue midodrine to 10 mg tid on HD days.   2. LVAD:  VAD interrogated personally.  Parameters stable with PI events on HD days.  - He is now off ASA.  - Continue warfarin with INR goal 2-2.5.   3. CAD s/p CABG 2002:  Last cath in 6/19 with patent grafts. No chest pain.  - Continue Crestor.  4. ESRD: Currently dialyzing via tunneled catheter. No low flow events.  - Continue HD M-W-F  - With continuous flow LVAD, think AV fistula would be unlikely to mature.   5. Atrial flutter/fibrillation: s/p DC-CV 08/21/18.  AF burden 12.7% on device interrogation today.  6. 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  - Continue gabapentin 7. DM: Per primary care.  8. Anemia: Hgb stable on recent labs.   9. Suspected Gastroparesis. Nausea improved on Reglan.  10. VT: Episode today with successful ICD discharge.  No definite trigger, he did not actually feel the VT.  - As this is his first episode, would not start amiodarone.  - ICD parameters to be adjusted by EP.  - Check K, Mg.  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   Loralie Champagne 02/28/2022

## 2022-02-28 NOTE — Telephone Encounter (Signed)
The patient states he was sitting on his bed looking at his phone and it felt like a horse kicked him in the chest. He states he felt fine. The patient sent a transmission and he did get shocked. The nurse told him she was going to talk with Dr. Caryl Comes and give him a call back.

## 2022-03-01 ENCOUNTER — Telehealth: Payer: Self-pay

## 2022-03-01 NOTE — Patient Outreach (Signed)
  Care Management   Outreach Note  03/01/2022 Name: Marcus Johnson. MRN: [id number] DOB: June 26, 1961  An unsuccessful telephone outreach was attempted today. The patient was referred to the case management team for assistance with care management and care coordination.   Follow Up Plan:  The care management team will reach out to the patient again over the next 10 days.   Daneen Schick, BSW, CDP Social Worker, Certified Dementia Practitioner Care Coordination [phone]

## 2022-03-02 DIAGNOSIS — N2581 Secondary hyperparathyroidism of renal origin: Secondary | ICD-10-CM | POA: Diagnosis not present

## 2022-03-02 DIAGNOSIS — Z992 Dependence on renal dialysis: Secondary | ICD-10-CM | POA: Diagnosis not present

## 2022-03-02 DIAGNOSIS — N186 End stage renal disease: Secondary | ICD-10-CM | POA: Diagnosis not present

## 2022-03-05 ENCOUNTER — Ambulatory Visit: Payer: Self-pay

## 2022-03-05 DIAGNOSIS — N2581 Secondary hyperparathyroidism of renal origin: Secondary | ICD-10-CM | POA: Diagnosis not present

## 2022-03-05 DIAGNOSIS — Z992 Dependence on renal dialysis: Secondary | ICD-10-CM | POA: Diagnosis not present

## 2022-03-05 DIAGNOSIS — I509 Heart failure, unspecified: Secondary | ICD-10-CM | POA: Diagnosis not present

## 2022-03-05 DIAGNOSIS — N186 End stage renal disease: Secondary | ICD-10-CM | POA: Diagnosis not present

## 2022-03-05 NOTE — Patient Outreach (Signed)
  Care Management   Outreach Note  03/05/2022 Name: Marcus Johnson. MRN: [id number] DOB: 08-23-1960  Third unsuccessful telephone outreach was attempted today. The patient was referred to the case management team for assistance with care management and care coordination. The patient's primary care provider has been notified of our unsuccessful attempts to make or maintain contact with the patient. The care management team is pleased to engage with this patient at any time in the future should he/she be interested in assistance from the care management team.   Follow Up Plan:  A HIPAA compliant phone message was left for the patient providing contact information and requesting a return call. No further outreach planned at this time.   Daneen Schick, BSW, CDP Social Worker, Certified Dementia Practitioner Care Coordination [phone]

## 2022-03-06 ENCOUNTER — Ambulatory Visit (HOSPITAL_COMMUNITY): Payer: Self-pay | Admitting: Pharmacist

## 2022-03-06 LAB — POCT INR: INR: 2 (ref 2.0–3.0)

## 2022-03-06 NOTE — Progress Notes (Signed)
LVAD INR 

## 2022-03-07 DIAGNOSIS — N186 End stage renal disease: Secondary | ICD-10-CM | POA: Diagnosis not present

## 2022-03-07 DIAGNOSIS — Z992 Dependence on renal dialysis: Secondary | ICD-10-CM | POA: Diagnosis not present

## 2022-03-07 DIAGNOSIS — N2581 Secondary hyperparathyroidism of renal origin: Secondary | ICD-10-CM | POA: Diagnosis not present

## 2022-03-09 DIAGNOSIS — N186 End stage renal disease: Secondary | ICD-10-CM | POA: Diagnosis not present

## 2022-03-09 DIAGNOSIS — N2581 Secondary hyperparathyroidism of renal origin: Secondary | ICD-10-CM | POA: Diagnosis not present

## 2022-03-09 DIAGNOSIS — Z992 Dependence on renal dialysis: Secondary | ICD-10-CM | POA: Diagnosis not present

## 2022-03-12 DIAGNOSIS — N2581 Secondary hyperparathyroidism of renal origin: Secondary | ICD-10-CM | POA: Diagnosis not present

## 2022-03-12 DIAGNOSIS — N186 End stage renal disease: Secondary | ICD-10-CM | POA: Diagnosis not present

## 2022-03-12 DIAGNOSIS — Z992 Dependence on renal dialysis: Secondary | ICD-10-CM | POA: Diagnosis not present

## 2022-03-13 ENCOUNTER — Ambulatory Visit (HOSPITAL_COMMUNITY): Payer: Self-pay | Admitting: Pharmacist

## 2022-03-13 LAB — POCT INR: INR: 1.8 — AB (ref 2.0–3.0)

## 2022-03-13 NOTE — Progress Notes (Signed)
LVAD INR 

## 2022-03-14 DIAGNOSIS — N2581 Secondary hyperparathyroidism of renal origin: Secondary | ICD-10-CM | POA: Diagnosis not present

## 2022-03-14 DIAGNOSIS — Z992 Dependence on renal dialysis: Secondary | ICD-10-CM | POA: Diagnosis not present

## 2022-03-14 DIAGNOSIS — N186 End stage renal disease: Secondary | ICD-10-CM | POA: Diagnosis not present

## 2022-03-16 DIAGNOSIS — Z992 Dependence on renal dialysis: Secondary | ICD-10-CM | POA: Diagnosis not present

## 2022-03-16 DIAGNOSIS — N2581 Secondary hyperparathyroidism of renal origin: Secondary | ICD-10-CM | POA: Diagnosis not present

## 2022-03-16 DIAGNOSIS — N186 End stage renal disease: Secondary | ICD-10-CM | POA: Diagnosis not present

## 2022-03-19 DIAGNOSIS — Z992 Dependence on renal dialysis: Secondary | ICD-10-CM | POA: Diagnosis not present

## 2022-03-19 DIAGNOSIS — N2581 Secondary hyperparathyroidism of renal origin: Secondary | ICD-10-CM | POA: Diagnosis not present

## 2022-03-19 DIAGNOSIS — N186 End stage renal disease: Secondary | ICD-10-CM | POA: Diagnosis not present

## 2022-03-20 ENCOUNTER — Ambulatory Visit (HOSPITAL_COMMUNITY): Payer: Self-pay | Admitting: Pharmacist

## 2022-03-20 ENCOUNTER — Ambulatory Visit: Payer: Medicare HMO | Admitting: Podiatry

## 2022-03-20 LAB — POCT INR: INR: 2.2 (ref 2.0–3.0)

## 2022-03-21 DIAGNOSIS — N2581 Secondary hyperparathyroidism of renal origin: Secondary | ICD-10-CM | POA: Diagnosis not present

## 2022-03-21 DIAGNOSIS — Z992 Dependence on renal dialysis: Secondary | ICD-10-CM | POA: Diagnosis not present

## 2022-03-21 DIAGNOSIS — N186 End stage renal disease: Secondary | ICD-10-CM | POA: Diagnosis not present

## 2022-03-23 DIAGNOSIS — N2581 Secondary hyperparathyroidism of renal origin: Secondary | ICD-10-CM | POA: Diagnosis not present

## 2022-03-23 DIAGNOSIS — Z992 Dependence on renal dialysis: Secondary | ICD-10-CM | POA: Diagnosis not present

## 2022-03-23 DIAGNOSIS — N186 End stage renal disease: Secondary | ICD-10-CM | POA: Diagnosis not present

## 2022-03-26 DIAGNOSIS — Z992 Dependence on renal dialysis: Secondary | ICD-10-CM | POA: Diagnosis not present

## 2022-03-26 DIAGNOSIS — N186 End stage renal disease: Secondary | ICD-10-CM | POA: Diagnosis not present

## 2022-03-26 DIAGNOSIS — N2581 Secondary hyperparathyroidism of renal origin: Secondary | ICD-10-CM | POA: Diagnosis not present

## 2022-03-27 ENCOUNTER — Ambulatory Visit (INDEPENDENT_AMBULATORY_CARE_PROVIDER_SITE_OTHER): Payer: Medicare HMO | Admitting: Podiatry

## 2022-03-27 ENCOUNTER — Encounter: Payer: Self-pay | Admitting: Podiatry

## 2022-03-27 ENCOUNTER — Encounter: Payer: Self-pay | Admitting: Family Medicine

## 2022-03-27 ENCOUNTER — Ambulatory Visit (INDEPENDENT_AMBULATORY_CARE_PROVIDER_SITE_OTHER): Payer: Medicare HMO | Admitting: Family Medicine

## 2022-03-27 ENCOUNTER — Ambulatory Visit (HOSPITAL_COMMUNITY): Payer: Self-pay | Admitting: Pharmacist

## 2022-03-27 ENCOUNTER — Other Ambulatory Visit: Payer: Self-pay | Admitting: Family Medicine

## 2022-03-27 VITALS — BP 100/68 | HR 82 | Temp 98.2°F | Wt 178.0 lb

## 2022-03-27 DIAGNOSIS — I10 Essential (primary) hypertension: Secondary | ICD-10-CM

## 2022-03-27 DIAGNOSIS — E1122 Type 2 diabetes mellitus with diabetic chronic kidney disease: Secondary | ICD-10-CM

## 2022-03-27 DIAGNOSIS — N183 Chronic kidney disease, stage 3 unspecified: Secondary | ICD-10-CM

## 2022-03-27 DIAGNOSIS — M79674 Pain in right toe(s): Secondary | ICD-10-CM | POA: Diagnosis not present

## 2022-03-27 DIAGNOSIS — N179 Acute kidney failure, unspecified: Secondary | ICD-10-CM | POA: Diagnosis not present

## 2022-03-27 DIAGNOSIS — Z992 Dependence on renal dialysis: Secondary | ICD-10-CM

## 2022-03-27 DIAGNOSIS — Z794 Long term (current) use of insulin: Secondary | ICD-10-CM | POA: Diagnosis not present

## 2022-03-27 DIAGNOSIS — M2010 Hallux valgus (acquired), unspecified foot: Secondary | ICD-10-CM

## 2022-03-27 DIAGNOSIS — N185 Chronic kidney disease, stage 5: Secondary | ICD-10-CM

## 2022-03-27 DIAGNOSIS — Z7901 Long term (current) use of anticoagulants: Secondary | ICD-10-CM | POA: Diagnosis not present

## 2022-03-27 DIAGNOSIS — I2510 Atherosclerotic heart disease of native coronary artery without angina pectoris: Secondary | ICD-10-CM

## 2022-03-27 DIAGNOSIS — M79675 Pain in left toe(s): Secondary | ICD-10-CM | POA: Diagnosis not present

## 2022-03-27 DIAGNOSIS — N186 End stage renal disease: Secondary | ICD-10-CM

## 2022-03-27 DIAGNOSIS — B351 Tinea unguium: Secondary | ICD-10-CM

## 2022-03-27 DIAGNOSIS — I5042 Chronic combined systolic (congestive) and diastolic (congestive) heart failure: Secondary | ICD-10-CM

## 2022-03-27 LAB — POCT GLYCOSYLATED HEMOGLOBIN (HGB A1C): Hemoglobin A1C: 7 % — AB (ref 4.0–5.6)

## 2022-03-27 LAB — POCT INR: INR: 2.2 (ref 2.0–3.0)

## 2022-03-27 MED ORDER — LANTUS SOLOSTAR 100 UNIT/ML ~~LOC~~ SOPN: 10.0000 [IU] | PEN_INJECTOR | Freq: Every day | SUBCUTANEOUS | 5 refills | Status: DC

## 2022-03-27 NOTE — Addendum Note (Signed)
Addended by: Wyvonne Lenz on: 03/27/2022 09:19 AM   Modules accepted: Orders

## 2022-03-27 NOTE — Telephone Encounter (Signed)
Please call this new RX in for him. Give 15 with 5 rf

## 2022-03-27 NOTE — Telephone Encounter (Signed)
Pharmacy comment is insurance will not pay for medication.   Seeking alternative.

## 2022-03-27 NOTE — Progress Notes (Signed)
This patient presents  to my office for at risk foot care.  This patient requires this care by a professional since this patient will be at risk due to having  CKD, coagulation defect and DM.  This patient is unable to cut nails himself since the patient cannot reach his nails.These nails are painful walking and wearing shoes.  This patient presents for at risk foot care today.  General Appearance  Alert, conversant and in no acute stress.  Vascular  Dorsalis pedis and posterior tibial  pulses are absent  bilaterally.  Capillary return is within normal limits  bilaterally. Temperature is within normal limits  bilaterally.  Neurologic  Senn-Weinstein monofilament wire test absent  bilaterally. Muscle power within normal limits bilaterally.  Nails Thick disfigured discolored nails with subungual debris  from hallux to fifth toes bilaterally. No evidence of bacterial infection or drainage bilaterally.  Orthopedic  No limitations of motion  feet .  No crepitus or effusions noted.  No bony pathology or digital deformities noted.  No  STJ ROM left foot.  Skin  normotropic skin with no porokeratosis noted bilaterally.  No signs of infections or ulcers noted.   Callus right midfoot.  Onychomycosis  Pain in right toes  Pain in left toes  Consent was obtained for treatment procedures.   Mechanical debridement of nails 1-5  bilaterally performed with a nail nipper.  Filed with dremel without incident.   Debride callus right foot with dremel tool.  To consider diabetic shoes in future.   Return office visit     3 months                 Told patient to return for periodic foot care and evaluation due to potential at risk complications.   Azaria Bartell DPM   

## 2022-03-27 NOTE — Progress Notes (Signed)
   Subjective:    Patient ID: Marcus Jewett., male    DOB: 12-Sep-1960, 61 y.o.   MRN: [id number]  HPI Here to follow up on diabetes. He has been seeing Cardiology frequently in the past 2 years, and he goes to dialysis 3 days a week. He had a left ventricular assist device installed on 10-13-20, and this has been working well. His creatine and GFR on 02-28-22 were 6.8 and 9. His last A1c in March was excellent at 5.5%. At that time he was taking Lantus every day at bedtime and his am fasting glucoses averaged 90-120. At some point however the cardiologist stopped his Lantus, and his glucoses have been trending up ever since. Now his am fasting glucoses average 200-250. He feels fine in general other than some fatigue.    Review of Systems  Constitutional:  Positive for fatigue.  Respiratory: Negative.    Cardiovascular: Negative.        Objective:   Physical Exam Constitutional:      Appearance: Normal appearance.  Cardiovascular:     Rate and Rhythm: Normal rate and regular rhythm.     Pulses: Normal pulses.     Heart sounds: Normal heart sounds.  Pulmonary:     Effort: Pulmonary effort is normal.     Breath sounds: Normal breath sounds.  Neurological:     Mental Status: He is alert.           Assessment & Plan:  His type 2 diabetes is a little out of control. He will need to get back on Lantus at bedtime. But he will not require nearly as much as he used to. He will start on 10 units each night, and he will report back to [place] in 2 weeks.  Alysia Penna, MD

## 2022-03-28 DIAGNOSIS — N2581 Secondary hyperparathyroidism of renal origin: Secondary | ICD-10-CM | POA: Diagnosis not present

## 2022-03-28 DIAGNOSIS — Z992 Dependence on renal dialysis: Secondary | ICD-10-CM | POA: Diagnosis not present

## 2022-03-28 DIAGNOSIS — N186 End stage renal disease: Secondary | ICD-10-CM | POA: Diagnosis not present

## 2022-03-29 ENCOUNTER — Encounter: Payer: Self-pay | Admitting: Internal Medicine

## 2022-03-29 ENCOUNTER — Other Ambulatory Visit: Payer: Self-pay

## 2022-03-29 ENCOUNTER — Ambulatory Visit (INDEPENDENT_AMBULATORY_CARE_PROVIDER_SITE_OTHER): Payer: Medicare HMO | Admitting: Internal Medicine

## 2022-03-29 DIAGNOSIS — T827XXA Infection and inflammatory reaction due to other cardiac and vascular devices, implants and grafts, initial encounter: Secondary | ICD-10-CM | POA: Diagnosis not present

## 2022-03-29 MED ORDER — CEPHALEXIN 500 MG PO CAPS: 500.0000 mg | ORAL_CAPSULE | Freq: Two times a day (BID) | ORAL | 1 refills | Status: DC

## 2022-03-29 NOTE — Progress Notes (Signed)
[facility]  CHIEF COMPLAINT:    Follow up for driveline infection  SUBJECTIVE:    Marcus Johnson. is a 61 y.o. male with PMHx as below who presents to the clinic for driveline infection of LVAD.   Patient here today for follow up after initial visit on 02/22/22.  Per that note, patient was seen in the [place] clinic 02/13/22 after noticing blood and pus drainage from his site.  Patient had wound culture obtained at that time and was referred to our clinic in addition to being started on doxycycline.  He was noted to have tan/green drainage from wound bed at that time with site tunneling 7 cm.  He has been on doxycycline since that time and reports improvement but still has some bleeding/drainage.  He has no fevers, chills.  The Doxycycline is causing some GI disturbance.  His cultures have grown MSSA.  He also grew MSSA from a driveline wound culture in February 2023.  At visit on 02/22/22 we changed to Keflex '500mg'$  BID dosed for HD schedule.  He saw the VAD clinic on 7/26 and they discussed dressing changes.  Site tunneling 5cm at that time and he was agreeable to seeing Dr Prescott Gum for possible debridement.  Today, reports doing well.  No issues with antibiotic.  Sees Dr Prescott Gum on Tuesday for discussion about debridement.  Reports his driveline site is healing up and improving.   Please see A&P for the details of today's visit and status of the patient's medical problems.   Patient's Medications  New Prescriptions   No medications on file  Previous Medications   ATORVASTATIN (LIPITOR) 40 MG TABLET    Take 1 tablet (40 mg total) by mouth daily.   GABAPENTIN (NEURONTIN) 100 MG CAPSULE    Take 1-2 capsules (100-200 mg total) by mouth See admin instructions. Taking 1 capsule ( 100 mg) in the AM and 2 capsules ( 200 mg) at bedtime   INSULIN GLARGINE (LANTUS SOLOSTAR) 100 UNIT/ML SOLOSTAR PEN    Inject 10 Units into the skin at bedtime.   MIDODRINE  (PROAMATINE) 10 MG TABLET    Take 1 tablet (10 mg total) by mouth 3 (three) times daily. Three times a day on dialysis days; NO midodrine on NON dialysis days   MULTIVITAMIN (RENA-VIT) TABS TABLET    TAKE 1 TABLET BY MOUTH EVERYDAY AT BEDTIME   NAPHAZOLINE-GLYCERIN (CLEAR EYES REDNESS) 0.012-0.25 % SOLN    Place 1-2 drops into the right eye 4 (four) times daily as needed for eye irritation.   PANTOPRAZOLE (PROTONIX) 40 MG TABLET    Take 1 tablet (40 mg total) by mouth daily.   ROSUVASTATIN (CRESTOR) 10 MG TABLET    TAKE 1 TABLET BY MOUTH EVERY DAY   SEVELAMER (RENAGEL) 800 MG TABLET    Take 800 mg by mouth 3 (three) times daily.   TRAMADOL (ULTRAM) 50 MG TABLET    Take 1 tablet (50 mg total) by mouth every 12 (twelve) hours as needed.   WARFARIN (COUMADIN) 3 MG TABLET    TAKE 1 TABLET BY MOUTH DAILY OR AS DIRECTED  Modified Medications   Modified Medication Previous Medication   CEPHALEXIN (KEFLEX) 500 MG CAPSULE cephALEXin (KEFLEX) 500 MG capsule      Take 1 capsule (500 mg total) by mouth 2 (two) times daily. Take evening dose after dialysis on HD days.    Take 1 capsule (500 mg total) by  mouth 2 (two) times daily. Take evening dose after dialysis on HD days.  Discontinued Medications   No medications on file      Past Medical History:  Diagnosis Date   AICD (automatic cardioverter/defibrillator) present    Single-chamber  implantable cardiac defibrillator - [employer]   Atrial fibrillation (HCC)    Cataract    Mixed form OU   CHF (congestive heart failure) (HCC)    Chronic kidney disease    Chronic kidney disease (CKD), stage III (moderate) (HCC)    Chronic systolic heart failure ([place])    a. Echo 5/13: Mild LVE, mild LVH, EF 10%, anteroseptal, lateral, apical AK, mild MR, mild LAE, moderately reduced RVSF, mild RAE, PASP 60;  b. 07/2014 Echo: EF 20-2%, diff HK, AKI of antsep/apical/mid-apicalinferior, mod reduced RV.   Coronary artery disease    a. s/p CABG 2002 b. LHC 5/13:  dLM 80%,  LAD subtotally occluded, pCFX occluded, pRCA 50%, mid? Occlusion with high take off of the PDA with 70% multiple lesions-not bypassed and supplies collaterals to LAD, LIMA-IM/ramus ok, S-OM ok, S-PLA branches ok. Medical therapy was recommended   COVID    Diabetic retinopathy ([place])    NPDR OD, PDR OS   Dyspnea    Gout    "on daily RX" (01/08/2018)   Hypertension    Hypertensive retinopathy    OU   Ischemic cardiomyopathy    a. Echo 5/13: Mild LVE, mild LVH, EF 10%, anteroseptal, lateral, apical AK, mild MR, mild LAE, moderately reduced RVSF, mild RAE, PASP 60;  b. 01/2012 s/p MDT [id number] Protecta XT VR AICD;  c. 07/2014 Echo: EF 20-2%, diff HK, AKI of antsep/apical/mid-apicalinferior, mod reduced RV.   MRSA (methicillin resistant Staphylococcus aureus)    Status post right foot plantar deep infection with MRSA status post  I&D 10/2008   Myocardial infarction [facility])    "was told I'd had several before heart OR in 2002" (01/08/2018)   Noncompliance    Peripheral neuropathy    Retinopathy, diabetic, background ([place])    Syncope    Type II diabetes mellitus ([place])    requiring insulin    Vitreous hemorrhage, left (HCC)    and proliferative diabetic retinopathy    Social History   Tobacco Use   Smoking status: Never   Smokeless tobacco: Never  Vaping Use   Vaping Use: Never used  Substance Use Topics   Alcohol use: No    Alcohol/week: 0.0 standard drinks of alcohol   Drug use: No    Family History  Problem Relation Age of Onset   Diabetes Father        died in his 74's   Hypertension Mother        died in her 41's - had a ppm.   Amblyopia Neg Hx    Blindness Neg Hx    Cataracts Neg Hx    Glaucoma Neg Hx    Macular degeneration Neg Hx    Retinal detachment Neg Hx    Strabismus Neg Hx    Retinitis pigmentosa Neg Hx     Allergies  Allergen Reactions   Meclizine Hcl Anaphylaxis and Swelling   Ivabradine Nausea Only    Review of Systems  All other systems reviewed and are  negative.  Except as noted above.   OBJECTIVE:    Vitals:   03/29/22 0914  Temp: 67 F (36.7 C)  TempSrc: Oral  Weight: 179 lb (81.2 kg)   Body mass index is 25.68 kg/m.  Physical Exam Constitutional:      Appearance: Normal appearance.  HENT:     Head: Normocephalic and atraumatic.  Skin:    General: Skin is warm and dry.     Findings: No rash.  Neurological:     General: No focal deficit present.     Mental Status: He is alert and oriented to person, place, and time.  Psychiatric:        Mood and Affect: Mood normal.        Behavior: Behavior normal.      Labs and Microbiology:    Latest Ref Rng & Units 02/28/2022   10:50 AM 11/08/2021   12:48 AM 11/07/2021    1:10 AM  CBC  WBC 4.0 - 10.5 K/uL 7.7  7.7  7.2   Hemoglobin 13.0 - 17.0 g/dL 11.3  9.4  9.8   Hematocrit 39.0 - 52.0 % 34.1  28.0  29.9   Platelets 150 - 400 K/uL 166  157  153       Latest Ref Rng & Units 02/28/2022   10:50 AM 11/08/2021   12:48 AM 11/07/2021    1:10 AM  CMP  Glucose 70 - 99 mg/dL 192  96  111   BUN 8 - 23 mg/dL 67  42  23   Creatinine 0.61 - 1.24 mg/dL 6.80  4.10  2.97   Sodium 135 - 145 mmol/L 139  135  138   Potassium 3.5 - 5.1 mmol/L 4.2  4.2  3.9   Chloride 98 - 111 mmol/L 99  101  103   CO2 22 - 32 mmol/L '27  26  27   '$ Calcium 8.9 - 10.3 mg/dL 9.2  9.5  9.1   Total Protein 6.5 - 8.1 g/dL 7.7     Total Bilirubin 0.3 - 1.2 mg/dL 0.7     Alkaline Phos 38 - 126 U/L 109     AST 15 - 41 U/L 38     ALT 0 - 44 U/L 23         ASSESSMENT & PLAN:    Infection due to implanted cardiac device [facility]) Patient here today for follow up of LVAD and driveline infection.  Noted to be tunneling 5cm at recent LVAD clinic visit on 02/28/22.  Discussed seeing Dr Prescott Gum for consideration of debridement and he reports he will do this next Tuesday.  Will continue Keflex '500mg'$  BID dosed for HD schedule and refills sent today. Anticipate that he will be on antibiotics indefinitely and discussed with  patient that once drive line or prosthetic material is infected it will presumably always be the case (especially with MSSA) unless explanted which is often times prohibitive particularly in this case.  RTC 3 months.     Raynelle [facility] for Infectious [facility] 03/29/2022, 9:29 AM   I have personally spent 30 minutes involved in face-to-face and non-face-to-face activities for this patient on the day of the visit. Professional time spent includes the following activities: Preparing to see the patient (review of tests), Obtaining and/or reviewing separately obtained history (admission/discharge record), Performing a medically appropriate examination and/or evaluation , Ordering medications/tests/procedures, referring and communicating with other health care professionals, Documenting clinical information in the EMR, Independently interpreting results (not separately reported), Communicating results to the patient/family/caregiver, Counseling and educating the patient/family/caregiver and Care coordination (not separately reported).

## 2022-03-29 NOTE — Assessment & Plan Note (Signed)
Patient here today for follow up of LVAD and driveline infection.  Noted to be tunneling 5cm at recent LVAD clinic visit on 02/28/22.  Discussed seeing Dr Prescott Gum for consideration of debridement and he reports he will do this next Tuesday.  Will continue Keflex '500mg'$  BID dosed for HD schedule and refills sent today. Anticipate that he will be on antibiotics indefinitely and discussed with patient that once drive line or prosthetic material is infected it will presumably always be the case (especially with MSSA) unless explanted which is often times prohibitive particularly in this case.  RTC 3 months.

## 2022-03-29 NOTE — Patient Instructions (Signed)
Thank you for coming to see me today. It was a pleasure seeing you.  To Do: I refilled your Keflex '500mg'$  Twice Daily Follow up in 3 months  If you have any questions or concerns, please do not hesitate to call the office at [phone].  Take Care,   Jule Ser

## 2022-03-30 DIAGNOSIS — N186 End stage renal disease: Secondary | ICD-10-CM | POA: Diagnosis not present

## 2022-03-30 DIAGNOSIS — N2581 Secondary hyperparathyroidism of renal origin: Secondary | ICD-10-CM | POA: Diagnosis not present

## 2022-03-30 DIAGNOSIS — Z992 Dependence on renal dialysis: Secondary | ICD-10-CM | POA: Diagnosis not present

## 2022-03-31 ENCOUNTER — Other Ambulatory Visit: Payer: Self-pay | Admitting: Family Medicine

## 2022-03-31 DIAGNOSIS — E1122 Type 2 diabetes mellitus with diabetic chronic kidney disease: Secondary | ICD-10-CM

## 2022-04-02 DIAGNOSIS — Z992 Dependence on renal dialysis: Secondary | ICD-10-CM | POA: Diagnosis not present

## 2022-04-02 DIAGNOSIS — N186 End stage renal disease: Secondary | ICD-10-CM | POA: Diagnosis not present

## 2022-04-02 DIAGNOSIS — N2581 Secondary hyperparathyroidism of renal origin: Secondary | ICD-10-CM | POA: Diagnosis not present

## 2022-04-03 ENCOUNTER — Telehealth (HOSPITAL_COMMUNITY): Payer: Self-pay | Admitting: Pharmacist

## 2022-04-03 ENCOUNTER — Other Ambulatory Visit (HOSPITAL_COMMUNITY): Payer: Self-pay

## 2022-04-03 ENCOUNTER — Encounter (HOSPITAL_COMMUNITY): Payer: Self-pay

## 2022-04-03 ENCOUNTER — Ambulatory Visit (HOSPITAL_COMMUNITY): Payer: Self-pay | Admitting: Pharmacist

## 2022-04-03 ENCOUNTER — Ambulatory Visit (HOSPITAL_COMMUNITY)
Admission: RE | Admit: 2022-04-03 | Discharge: 2022-04-03 | Disposition: A | Discharge status: Home / Self Care | Payer: Medicare HMO | Source: Ambulatory Visit | Attending: Cardiology | Admitting: Cardiology

## 2022-04-03 VITALS — BP 110/0 | HR 97 | Wt 180.4 lb

## 2022-04-03 DIAGNOSIS — R531 Weakness: Secondary | ICD-10-CM | POA: Insufficient documentation

## 2022-04-03 DIAGNOSIS — N186 End stage renal disease: Secondary | ICD-10-CM | POA: Insufficient documentation

## 2022-04-03 DIAGNOSIS — N1832 Chronic kidney disease, stage 3b: Secondary | ICD-10-CM

## 2022-04-03 DIAGNOSIS — E1136 Type 2 diabetes mellitus with diabetic cataract: Secondary | ICD-10-CM | POA: Insufficient documentation

## 2022-04-03 DIAGNOSIS — I5022 Chronic systolic (congestive) heart failure: Secondary | ICD-10-CM | POA: Diagnosis not present

## 2022-04-03 DIAGNOSIS — I255 Ischemic cardiomyopathy: Secondary | ICD-10-CM | POA: Insufficient documentation

## 2022-04-03 DIAGNOSIS — E1122 Type 2 diabetes mellitus with diabetic chronic kidney disease: Secondary | ICD-10-CM | POA: Diagnosis not present

## 2022-04-03 DIAGNOSIS — Z7901 Long term (current) use of anticoagulants: Secondary | ICD-10-CM | POA: Insufficient documentation

## 2022-04-03 DIAGNOSIS — Z79899 Other long term (current) drug therapy: Secondary | ICD-10-CM | POA: Diagnosis not present

## 2022-04-03 DIAGNOSIS — D649 Anemia, unspecified: Secondary | ICD-10-CM | POA: Insufficient documentation

## 2022-04-03 DIAGNOSIS — Z95811 Presence of heart assist device: Secondary | ICD-10-CM

## 2022-04-03 DIAGNOSIS — I4891 Unspecified atrial fibrillation: Secondary | ICD-10-CM | POA: Insufficient documentation

## 2022-04-03 DIAGNOSIS — R110 Nausea: Secondary | ICD-10-CM | POA: Diagnosis not present

## 2022-04-03 DIAGNOSIS — Z951 Presence of aortocoronary bypass graft: Secondary | ICD-10-CM | POA: Insufficient documentation

## 2022-04-03 DIAGNOSIS — I5042 Chronic combined systolic (congestive) and diastolic (congestive) heart failure: Secondary | ICD-10-CM | POA: Diagnosis not present

## 2022-04-03 DIAGNOSIS — M79641 Pain in right hand: Secondary | ICD-10-CM | POA: Insufficient documentation

## 2022-04-03 DIAGNOSIS — I4892 Unspecified atrial flutter: Secondary | ICD-10-CM | POA: Diagnosis not present

## 2022-04-03 DIAGNOSIS — I132 Hypertensive heart and chronic kidney disease with heart failure and with stage 5 chronic kidney disease, or end stage renal disease: Secondary | ICD-10-CM | POA: Insufficient documentation

## 2022-04-03 DIAGNOSIS — Z9889 Other specified postprocedural states: Secondary | ICD-10-CM | POA: Insufficient documentation

## 2022-04-03 DIAGNOSIS — I2510 Atherosclerotic heart disease of native coronary artery without angina pectoris: Secondary | ICD-10-CM | POA: Diagnosis not present

## 2022-04-03 DIAGNOSIS — B9561 Methicillin susceptible Staphylococcus aureus infection as the cause of diseases classified elsewhere: Secondary | ICD-10-CM | POA: Insufficient documentation

## 2022-04-03 DIAGNOSIS — Z992 Dependence on renal dialysis: Secondary | ICD-10-CM | POA: Diagnosis not present

## 2022-04-03 LAB — POCT INR: INR: 1.8 — AB (ref 2.0–3.0)

## 2022-04-03 MED ORDER — MIDODRINE HCL 10 MG PO TABS: 15.0000 mg | ORAL_TABLET | Freq: Three times a day (TID) | ORAL | 11 refills | Status: DC

## 2022-04-03 NOTE — Progress Notes (Addendum)
Patient presents for  in [facility] today with brother [name]) for 2 month f/u with 1.5 year Intermacs. Reports no problems with VAD equipment or concerns with drive line.  Patient arrived via wheelchair today. Pt denies falls, heart failure symptoms, or signs of bleeding.  Patient reports he continues M/W/F dialysis and that is dry weight is 172 lbs. Pt confirms he takes Midodrine 10 mg TID on HD days only. Reports lightheadedness/dizziness after dialysis treatments has improved some but he does still have episodes occasionally. Per Dr. Aundra Dubin, pt will increase Midodrine to 15 mg three times daily. Pt reports cost of this drug is an issue. Audry Riles, PharmD in to speak with patient, she will attempt to apply for tier change to decrease patient cost.   Pt on Keflex 500 mg BID (dosed for HD schedule). [name] reports they are changing dressing every other day with improvement in drainage from wound. Dressing changed today in clinic. Currently wearing 2 anchors. Reports he wears binder at home, but does not like to wear during dialysis. See documentation below for dressing change details.   Dr. Prescott Gum here to assess wound. Site tunneling 4 cm with minimal drainage. He instructed patient to increase to daily dressing changes. Patient verbalized understanding of same. Per Dr. Prescott Gum, patient should remain on lifelong antibiotic for suppression.   Labs from Dialysis center from 03/28/22 reviewed by Dr. Aundra Dubin.   Vital Signs:  Doppler Pressure: 110 Automatc BP: 108/67 (92) HR: 97 SR O2: UTO % RA   Weight: 180.4 lbs Last wt: 174 lbs   VAD Indication: Destination Therapy due to CKD Stage 3b   LVAD assessment:  HM III: Speed: 5800 rpms Flow: 5.3 Power: 4.7 w    PI: 3.3 Alarms: few low voltage Events: > 90 PI events yesterday  Fixed speed:  5800 Low speed limit:  5500  Primary controller: back up battery due for replacement in 14 months Secondary controller:  back up battery due for  replacement in 16 months  I reviewed the LVAD parameters from today and compared the results to the patient's prior recorded data. LVAD interrogation was NEGATIVE for significant power changes, NEGATIVE for clinical alarms and STABLE for PI events/speed drops. No programming changes were made and pump is functioning within specified parameters. Pt is performing daily controller and system monitor self tests along with completing weekly and monthly maintenance for LVAD equipment.   LVAD equipment check completed and is in good working order. Back-up equipment present.    Annual Equipment Maintenance on UBC/PM was performed 10/12/21.  Exit Site Care: Dressing maintained every other day by pt's son. Gauze VAD dressing and anchor removed and site care performed using sterile technique. Drive line exit site cleaned with Chlora prep applicators x 2, allowed to dry before gauze dressing NO SILVER STRIP applied. Exit site partially incorporated. The velour is fully implanted at exit site. Slight  redness, no tenderness, or foul odor noted. Small amount of brown drainage at site. Site tunnels 4 cm. Drive line anchor re-applied x 2. Pt denies fever or chills. Instructed to change dressing every day. Provided with 21 daily dressing kits and 14 anchors for home use.   Device: Medtronic single Therapies: on 182 bpm Pacing: VVI 40 Last check:  02/28/22   BP & Labs:  Doppler 110 - Doppler is reflecting modified systolic   Hgb 21.3- No S/S of bleeding. Specifically denies melena/BRBPR or nosebleeds.   LDH 243 - established baseline of 160 - 180.  Denies tea-colored urine. No power elevations noted on interrogation.   K- 3.7  1.5 year Intermacs follow up completed including:  Quality of Life, KCCQ-12, and attempted Neurocognitive trail making.   Pt did not attempt 6 minute walk due to time restraint of VAD Coordinator.   Back up controller: 11V backup battery charged during this visit.  Loa     04/03/2022    3:22 PM 10/12/2021    1:21 PM 03/28/2021   12:19 PM  KCCQ-12  1 a. Ability to shower/bathe Not at all limited Extremely limited Slightly limited  1 b. Ability to walk 1 block Moderately limited Slightly limited Slightly limited  1 c. Ability to hurry/jog Not at all limited Other, Did not do Extremely limited  2. Edema feet/ankles/legs Never over the past 2 weeks Never over the past 2 weeks Never over the past 2 weeks  3. Limited by fatigue Several times a day Never over the past 2 weeks Never over the past 2 weeks  4. Limited by dyspnea Never over the past 2 weeks Less than once a week At least once a day  5. Sitting up / on 3+ pillows 1-2 times a week Never over the past 2 weeks 1-2 times a week  6. Limited enjoyment of life Not limited at all Not limited at all Limited quite a bit  7. Rest of life w/ symptoms Somewhat satisfied Somewhat satisfied Somewhat satisfied  8 a. Participation in hobbies N/A, did not do for other reasons Slightly limited Limited quite a bit  8 b. Participation in chores N/A, did not do for other reasons Slightly limited Limited quite a bit  8 c. Visiting family/friends Moderately limited Slightly limited Limited quite a bit      Patient Goals:  Would love to get off dialysis.   Patient Instructions: Increase Midodrine to 15 mg three times daily. Lauren (PharmD) is checking on pricing issue. Return to [facility] in 2 months.   Zada Girt RN VAD Coordinator  Office: [phone]  24/7 Pager: [phone]  24/7 Pager: [phone]    Follow up for Heart Failure/LVAD:  Ellamae Sia Reyhan [place]. is a 61 y.o. male who has a h/o DM2, CAD s/p CABG 2536, systolic HF due to ischemic cardiomyopathy with EF 20-25% (echo 12/15), DM2 and CKD. He is s/p Medtronic single chamber ICD.   Admitted in 12/15 due to ADHF. Required short course milrinone for diuresis. Diuresed 30 pounds.    CPX 2/16 showed severely reduced  functional capacity.  There was a significant disconnect between his symptoms and the CPX.  RHC in 6/16 showed fairly normal filling pressures and low but not markedly low cardiac output.    CPX (4/19) was submaximal but suggested severe limitation from HF.    He was admitted in 6/19 with marked volume overload and dyspnea.  Echo in 6/19 showed EF 20% with severe global hypokinesis.  LHC/RHC showed severe native vessel CAD with patent LIMA-D, SVG-OM, and SVG-PLV; cardiac index low.  He was started on milrinone and diuresed.  We discussed LVAD extensively in light of low output, creatinine that is trending up and concern for decompensation of RV over time. He was adamant that he did not want to pursue LVAD workup yet and did not want a referral for transplant evaluation. Milrinone was weaned off and he was discharged home.    Admitted 08/01/18-08/07/18 with volume overload and left foot wound. Diuresed 38 lbs with IV lasix and then transitioned  back to torsemide 100 mg BID. ID was consulted with concerns for left foot osteomyelitis noted on CT scan. He got IV ancef q8 hours x 42 days. [facility] provided teaching and supplies for home antibiotics. Course complicated by AKI. DC weight 200 lbs.    He went into atrial flutter and had DCCV to NSR in 1/20.    Echo in 4/21 showed EF 25% with apical akinesis and diffuse hypokinesis relatively preserved in lateral wall, mildly decreased RV systolic function, PASP 41 mmHg. CPX in 5/21 showed severe functional limitation suggestive of advanced HF. 5/21 Zio patch showed short atrial flutter runs, 3.8% PVCs.    He developed COVID-19 infection in 1/22.  Symptoms were predominantly GI.    He developed progressive worsening of CHF in early 2022 and was admitted in 3/22 with low output HF.  Impella 5.5 was placed as bridge to Heartmate III LVAD placed in 10/13/20.  He developed post-op renal failure and ended up on dialysis.  He was markedly deconditioned post-op and went to  CIR.    Still has pain and minimal function in his right hand.  Nerve conduction study showed a chronic sensorimotor neuropathy in the right arm.    Ramp echo in 3/23 showed severe LV dysfunction with mild RV enlargement and mild dysfunction, mild-moderate AI.  Speed was increased to 5800 rpm.  The aortic valve still opened with every beat but only slightly. Flow increased from 4.1 to 5.3 L/min and PI remained stable.   Patient was admitted in 4/23 with falls and lightheadedness. He fractured his left foot when falling and initially was unable to walk.  He was thought to be hypotensive due to over-diuresis and dry weight was increased.  Midodrine on HD days was also increased.  He was treated for a driveline infection with vancomycin then doxycycline, now off.   ICD shock for VT in 7/23, not started on amiodarone given only 1 episode.   Patient returns today for followup of CHF/LVAD.  No further VT. He still has "weak spells" after HD.  He has not fallen. No dyspnea walking on flat ground though not very active. He continues on Keflex for chronic suppression of driveline infection.  Driveline site looks ok today.     I reviewed the LVAD parameters from today, and compared the results to the patient's prior recorded data.  No programming changes were made.  The LVAD is functioning within specified parameters.  The patient performs LVAD self-test daily.  LVAD interrogation was negative for any significant power changes, alarms or PI events/speed drops.  LVAD equipment check completed and is in good working order.  Back-up equipment present.   LVAD education done on emergency procedures and precautions and reviewed exit site care.  Labs (5/22): LDH 174, hgb 10.6 Labs (7/22): LDH 164 Labs (8/22): hgb 11.4 Labs (10/22): hgb 12 Labs (11/22): hgb 12.2 Labs (4/23): LDL 163, hgb 10.4 Labs (6/23): hgb 12.2, LDH 284 Labs (7/23): LFTs normal, hgb 11.3, LDH 235   Past Medical History:  Diagnosis Date    AICD (automatic cardioverter/defibrillator) present    Single-chamber  implantable cardiac defibrillator - Medtronic   Atrial fibrillation (HCC)    Cataract    Mixed form OU   CHF (congestive heart failure) (HCC)    Chronic kidney disease    Chronic kidney disease (CKD), stage III (moderate) (HCC)    Chronic systolic heart failure ([place])    a. Echo 5/13: Mild LVE, mild LVH, EF 10%, anteroseptal, lateral, apical  AK, mild MR, mild LAE, moderately reduced RVSF, mild RAE, PASP 60;  b. 07/2014 Echo: EF 20-2%, diff HK, AKI of antsep/apical/mid-apicalinferior, mod reduced RV.   Coronary artery disease    a. s/p CABG 2002 b. LHC 5/13:  dLM 80%, LAD subtotally occluded, pCFX occluded, pRCA 50%, mid? Occlusion with high take off of the PDA with 70% multiple lesions-not bypassed and supplies collaterals to LAD, LIMA-IM/ramus ok, S-OM ok, S-PLA branches ok. Medical therapy was recommended   COVID    Diabetic retinopathy ([place])    NPDR OD, PDR OS   Dyspnea    Gout    "on daily RX" (01/08/2018)   Hypertension    Hypertensive retinopathy    OU   Ischemic cardiomyopathy    a. Echo 5/13: Mild LVE, mild LVH, EF 10%, anteroseptal, lateral, apical AK, mild MR, mild LAE, moderately reduced RVSF, mild RAE, PASP 60;  b. 01/2012 s/p MDT [id number] Protecta XT VR AICD;  c. 07/2014 Echo: EF 20-2%, diff HK, AKI of antsep/apical/mid-apicalinferior, mod reduced RV.   MRSA (methicillin resistant Staphylococcus aureus)    Status post right foot plantar deep infection with MRSA status post  I&D 10/2008   Myocardial infarction [facility])    "was told I'd had several before heart OR in 2002" (01/08/2018)   Noncompliance    Peripheral neuropathy    Retinopathy, diabetic, background ([place])    Syncope    Type II diabetes mellitus ([place])    requiring insulin    Vitreous hemorrhage, left (HCC)    and proliferative diabetic retinopathy    Current Outpatient Medications  Medication Sig Dispense Refill   atorvastatin (LIPITOR) 40 MG  tablet Take 1 tablet (40 mg total) by mouth daily.     cephALEXin (KEFLEX) 500 MG capsule Take 1 capsule (500 mg total) by mouth 2 (two) times daily. Take evening dose after dialysis on HD days. 120 capsule 1   gabapentin (NEURONTIN) 100 MG capsule Take 1-2 capsules (100-200 mg total) by mouth See admin instructions. Taking 1 capsule ( 100 mg) in the AM and 2 capsules ( 200 mg) at bedtime 90 capsule 3   insulin glargine (LANTUS SOLOSTAR) 100 UNIT/ML Solostar Pen Inject 10 Units into the skin at bedtime. 15 mL 5   multivitamin (RENA-VIT) TABS tablet TAKE 1 TABLET BY MOUTH EVERYDAY AT BEDTIME 90 tablet 3   naphazoline-glycerin (CLEAR EYES REDNESS) 0.012-0.25 % SOLN Place 1-2 drops into the right eye 4 (four) times daily as needed for eye irritation.     pantoprazole (PROTONIX) 40 MG tablet Take 1 tablet (40 mg total) by mouth daily. 90 tablet 3   rosuvastatin (CRESTOR) 10 MG tablet TAKE 1 TABLET BY MOUTH EVERY DAY 90 tablet 3   sevelamer (RENAGEL) 800 MG tablet Take 800 mg by mouth 3 (three) times daily.     traMADol (ULTRAM) 50 MG tablet Take 1 tablet (50 mg total) by mouth every 12 (twelve) hours as needed. 30 tablet 0   warfarin (COUMADIN) 3 MG tablet TAKE 1 TABLET BY MOUTH DAILY OR AS DIRECTED 90 tablet 5   midodrine (PROAMATINE) 10 MG tablet Take 1.5 tablets (15 mg total) by mouth 3 (three) times daily. Three times a day on dialysis days; NO midodrine on NON dialysis days 135 tablet 11   No current facility-administered medications for this encounter.    Meclizine hcl and Ivabradine  REVIEW OF SYSTEMS: All systems negative except as listed in HPI, PMH and Problem list.   LVAD INTERROGATION:  Please  see LVAD nurse's note above.   BP (!) 110/0   Pulse 97   Wt 81.8 kg (180 lb 6.4 oz)   SpO2 100%   BMI 25.88 kg/m   Physical Exam: General: Well appearing this am. NAD.  HEENT: Normal. Neck: Supple, JVP 7-8 cm. Carotids OK.  Cardiac:  Mechanical heart sounds with LVAD hum present.   Lungs:  CTAB, normal effort.  Abdomen:  NT, ND, no HSM. No bruits or masses. +BS  LVAD exit site: Well-healed and incorporated. Dressing dry and intact. No erythema or drainage. Stabilization device present and accurately applied. Driveline dressing changed daily per sterile technique. Extremities:  Warm and dry. No cyanosis, clubbing, rash, or edema.  Neuro:  Alert & oriented x 3. Cranial nerves grossly intact. Moves all 4 extremities w/o difficulty. Affect pleasant    ASSESSMENT AND PLAN:  1. Chronic systolic CHF:  Ischemic cardiomyopathy. Has Medtronic ICD. Low output HF, admitted and Impella 5.5 placed 10/04/20.  HM3 LVAD was placed on 10/13/20. Developed post-op renal failure requiring eventual hemodialysis.  Ramp echo in 3/23, speed increased to 5800 rpm under echo guidance with increase in flow and stable PI.  MAP stable.  Not volume overloaded.  NYHA class II.  No falls or syncope, still feels weak after HD.  LDH stable at 235.   - Increase midodrine to 15 mg tid on HD days.   2. LVAD:  VAD interrogated personally.  Parameters stable with PI events on HD days.  - He is now off ASA.  - Continue warfarin with INR goal 2-2.5.   3. CAD s/p CABG 2002:  Last cath in 6/19 with patent grafts. No chest pain.  - Continue Crestor.  4. ESRD: Currently dialyzing via tunneled catheter. No low flow events.  - Continue HD M-W-F  - With continuous flow LVAD, think AV fistula would be unlikely to mature.   5. Atrial flutter/fibrillation: s/p DC-CV 08/21/18.  Has occasional episodes of AF but not prolonged.  6. 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  - Continue gabapentin 7. DM: Per primary care.  8. Anemia: Hgb stable on recent labs in 7/23.    9. Suspected Gastroparesis. Nausea improved on Reglan.  10. VT: Episode in 7/23 with successful ICD discharge.  No definite trigger, he did not actually feel the VT. No further episodes.   - As this was his first episode and there has been no recurrence, would not start amiodarone.  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   Loralie Champagne 04/03/2022

## 2022-04-03 NOTE — Telephone Encounter (Signed)
Tier exception submitted for midodrine.   Status is pending   Will continue to follow.   Audry Riles, PharmD, BCPS, BCCP, CPP Heart Failure Clinic Pharmacist [phone]

## 2022-04-03 NOTE — Patient Instructions (Signed)
Increase Midodrine to 15 mg three times daily. Lauren (PharmD) is checking on pricing issue. Return to [facility] in 2 months.

## 2022-04-04 DIAGNOSIS — Z992 Dependence on renal dialysis: Secondary | ICD-10-CM | POA: Diagnosis not present

## 2022-04-04 DIAGNOSIS — N2581 Secondary hyperparathyroidism of renal origin: Secondary | ICD-10-CM | POA: Diagnosis not present

## 2022-04-04 DIAGNOSIS — N186 End stage renal disease: Secondary | ICD-10-CM | POA: Diagnosis not present

## 2022-04-05 ENCOUNTER — Ambulatory Visit (INDEPENDENT_AMBULATORY_CARE_PROVIDER_SITE_OTHER): Payer: Medicare HMO

## 2022-04-05 ENCOUNTER — Other Ambulatory Visit (HOSPITAL_COMMUNITY): Payer: Self-pay

## 2022-04-05 DIAGNOSIS — N186 End stage renal disease: Secondary | ICD-10-CM | POA: Diagnosis not present

## 2022-04-05 DIAGNOSIS — Z992 Dependence on renal dialysis: Secondary | ICD-10-CM | POA: Diagnosis not present

## 2022-04-05 DIAGNOSIS — M2010 Hallux valgus (acquired), unspecified foot: Secondary | ICD-10-CM

## 2022-04-05 DIAGNOSIS — E1122 Type 2 diabetes mellitus with diabetic chronic kidney disease: Secondary | ICD-10-CM

## 2022-04-05 DIAGNOSIS — Z794 Long term (current) use of insulin: Secondary | ICD-10-CM

## 2022-04-05 DIAGNOSIS — I509 Heart failure, unspecified: Secondary | ICD-10-CM | POA: Diagnosis not present

## 2022-04-05 NOTE — Progress Notes (Signed)
Patient presents to the office today for diabetic shoe and insole measuring.  Patient was measured with brannock device to determine size and width for 1 pair of extra depth shoes and foam casted for 3 pair of insoles.   Documentation of medical necessity will be sent to patient's treating diabetic doctor to verify and sign.   Patient's diabetic provider: Alysia Penna, MD  Shoes and insoles will be ordered at that time and patient will be notified for an appointment for fitting when they arrive.   Shoe size (per patient): 12 medium men's   Brannock measurement: 10.5 medium (D) men's  Patient shoe selection-   1st choice:   [id number] Apex [place]  2nd choice:  [id number] Apex Active walker  Shoe size ordered: 10.5 medium men's

## 2022-04-05 NOTE — Telephone Encounter (Signed)
Advanced Heart Failure Patient Advocate Encounter  Tier exception for midodrine has been approved. Medication will be covered at the Tier 2 copay.      Effective dates: 04/05/22 through 08/05/22  Audry Riles, PharmD, BCPS, BCCP, CPP Heart Failure Clinic Pharmacist [phone]

## 2022-04-06 DIAGNOSIS — Z992 Dependence on renal dialysis: Secondary | ICD-10-CM | POA: Diagnosis not present

## 2022-04-06 DIAGNOSIS — N186 End stage renal disease: Secondary | ICD-10-CM | POA: Diagnosis not present

## 2022-04-06 DIAGNOSIS — N2581 Secondary hyperparathyroidism of renal origin: Secondary | ICD-10-CM | POA: Diagnosis not present

## 2022-04-09 DIAGNOSIS — Z992 Dependence on renal dialysis: Secondary | ICD-10-CM | POA: Diagnosis not present

## 2022-04-09 DIAGNOSIS — N186 End stage renal disease: Secondary | ICD-10-CM | POA: Diagnosis not present

## 2022-04-09 DIAGNOSIS — N2581 Secondary hyperparathyroidism of renal origin: Secondary | ICD-10-CM | POA: Diagnosis not present

## 2022-04-10 ENCOUNTER — Ambulatory Visit (HOSPITAL_COMMUNITY): Payer: Self-pay | Admitting: Pharmacist

## 2022-04-10 ENCOUNTER — Ambulatory Visit (INDEPENDENT_AMBULATORY_CARE_PROVIDER_SITE_OTHER): Payer: Medicare HMO

## 2022-04-10 DIAGNOSIS — I5042 Chronic combined systolic (congestive) and diastolic (congestive) heart failure: Secondary | ICD-10-CM

## 2022-04-10 LAB — POCT INR: INR: 2.1 (ref 2.0–3.0)

## 2022-04-11 DIAGNOSIS — Z992 Dependence on renal dialysis: Secondary | ICD-10-CM | POA: Diagnosis not present

## 2022-04-11 DIAGNOSIS — N2581 Secondary hyperparathyroidism of renal origin: Secondary | ICD-10-CM | POA: Diagnosis not present

## 2022-04-11 DIAGNOSIS — N186 End stage renal disease: Secondary | ICD-10-CM | POA: Diagnosis not present

## 2022-04-12 LAB — CUP PACEART REMOTE DEVICE CHECK
Battery Remaining Longevity: 126 mo
Battery Voltage: 3.03 V
Brady Statistic RV Percent Paced: 0.01 %
Date Time Interrogation Session: 20230906175729
HighPow Impedance: 33 Ohm
HighPow Impedance: 40 Ohm
Implantable Lead Implant Date: 20130603
Implantable Lead Location: 753860
Implantable Lead Model: 6947
Implantable Pulse Generator Implant Date: 20211129
Lead Channel Impedance Value: 304 Ohm
Lead Channel Impedance Value: 608 Ohm
Lead Channel Pacing Threshold Amplitude: 1.125 V
Lead Channel Pacing Threshold Pulse Width: 0.4 ms
Lead Channel Sensing Intrinsic Amplitude: 4.625 mV
Lead Channel Sensing Intrinsic Amplitude: 4.625 mV
Lead Channel Setting Pacing Amplitude: 2.75 V
Lead Channel Setting Pacing Pulse Width: 0.4 ms
Lead Channel Setting Sensing Sensitivity: 0.3 mV

## 2022-04-13 DIAGNOSIS — N186 End stage renal disease: Secondary | ICD-10-CM | POA: Diagnosis not present

## 2022-04-13 DIAGNOSIS — N2581 Secondary hyperparathyroidism of renal origin: Secondary | ICD-10-CM | POA: Diagnosis not present

## 2022-04-13 DIAGNOSIS — Z992 Dependence on renal dialysis: Secondary | ICD-10-CM | POA: Diagnosis not present

## 2022-04-16 DIAGNOSIS — Z992 Dependence on renal dialysis: Secondary | ICD-10-CM | POA: Diagnosis not present

## 2022-04-16 DIAGNOSIS — N2581 Secondary hyperparathyroidism of renal origin: Secondary | ICD-10-CM | POA: Diagnosis not present

## 2022-04-16 DIAGNOSIS — N186 End stage renal disease: Secondary | ICD-10-CM | POA: Diagnosis not present

## 2022-04-17 ENCOUNTER — Ambulatory Visit (HOSPITAL_COMMUNITY): Payer: Self-pay | Admitting: Pharmacist

## 2022-04-17 LAB — POCT INR: INR: 2.1 (ref 2.0–3.0)

## 2022-04-18 DIAGNOSIS — N2581 Secondary hyperparathyroidism of renal origin: Secondary | ICD-10-CM | POA: Diagnosis not present

## 2022-04-18 DIAGNOSIS — N186 End stage renal disease: Secondary | ICD-10-CM | POA: Diagnosis not present

## 2022-04-18 DIAGNOSIS — Z992 Dependence on renal dialysis: Secondary | ICD-10-CM | POA: Diagnosis not present

## 2022-04-20 DIAGNOSIS — Z992 Dependence on renal dialysis: Secondary | ICD-10-CM | POA: Diagnosis not present

## 2022-04-20 DIAGNOSIS — N186 End stage renal disease: Secondary | ICD-10-CM | POA: Diagnosis not present

## 2022-04-20 DIAGNOSIS — N2581 Secondary hyperparathyroidism of renal origin: Secondary | ICD-10-CM | POA: Diagnosis not present

## 2022-04-23 DIAGNOSIS — N2581 Secondary hyperparathyroidism of renal origin: Secondary | ICD-10-CM | POA: Diagnosis not present

## 2022-04-23 DIAGNOSIS — N186 End stage renal disease: Secondary | ICD-10-CM | POA: Diagnosis not present

## 2022-04-23 DIAGNOSIS — Z992 Dependence on renal dialysis: Secondary | ICD-10-CM | POA: Diagnosis not present

## 2022-04-24 ENCOUNTER — Ambulatory Visit (HOSPITAL_COMMUNITY): Payer: Self-pay | Admitting: Pharmacist

## 2022-04-24 DIAGNOSIS — Z7901 Long term (current) use of anticoagulants: Secondary | ICD-10-CM | POA: Diagnosis not present

## 2022-04-24 LAB — POCT INR: INR: 2.6 (ref 2.0–3.0)

## 2022-04-25 DIAGNOSIS — Z992 Dependence on renal dialysis: Secondary | ICD-10-CM | POA: Diagnosis not present

## 2022-04-25 DIAGNOSIS — N2581 Secondary hyperparathyroidism of renal origin: Secondary | ICD-10-CM | POA: Diagnosis not present

## 2022-04-25 DIAGNOSIS — N186 End stage renal disease: Secondary | ICD-10-CM | POA: Diagnosis not present

## 2022-04-27 DIAGNOSIS — N2581 Secondary hyperparathyroidism of renal origin: Secondary | ICD-10-CM | POA: Diagnosis not present

## 2022-04-27 DIAGNOSIS — Z992 Dependence on renal dialysis: Secondary | ICD-10-CM | POA: Diagnosis not present

## 2022-04-27 DIAGNOSIS — N186 End stage renal disease: Secondary | ICD-10-CM | POA: Diagnosis not present

## 2022-04-30 DIAGNOSIS — N2581 Secondary hyperparathyroidism of renal origin: Secondary | ICD-10-CM | POA: Diagnosis not present

## 2022-04-30 DIAGNOSIS — Z992 Dependence on renal dialysis: Secondary | ICD-10-CM | POA: Diagnosis not present

## 2022-04-30 DIAGNOSIS — N186 End stage renal disease: Secondary | ICD-10-CM | POA: Diagnosis not present

## 2022-05-01 ENCOUNTER — Ambulatory Visit (HOSPITAL_COMMUNITY): Payer: Self-pay | Admitting: Pharmacist

## 2022-05-01 LAB — POCT INR: INR: 2 (ref 2.0–3.0)

## 2022-05-02 ENCOUNTER — Encounter: Payer: Self-pay | Admitting: Family Medicine

## 2022-05-02 DIAGNOSIS — N186 End stage renal disease: Secondary | ICD-10-CM | POA: Diagnosis not present

## 2022-05-02 DIAGNOSIS — Z992 Dependence on renal dialysis: Secondary | ICD-10-CM | POA: Diagnosis not present

## 2022-05-02 DIAGNOSIS — N2581 Secondary hyperparathyroidism of renal origin: Secondary | ICD-10-CM | POA: Diagnosis not present

## 2022-05-03 MED ORDER — LANTUS SOLOSTAR 100 UNIT/ML ~~LOC~~ SOPN: 60.0000 [IU] | PEN_INJECTOR | Freq: Every day | SUBCUTANEOUS | 11 refills | Status: DC

## 2022-05-03 NOTE — Telephone Encounter (Signed)
I increased the dose to 60 units and sent in refills

## 2022-05-04 DIAGNOSIS — N2581 Secondary hyperparathyroidism of renal origin: Secondary | ICD-10-CM | POA: Diagnosis not present

## 2022-05-04 DIAGNOSIS — N186 End stage renal disease: Secondary | ICD-10-CM | POA: Diagnosis not present

## 2022-05-04 DIAGNOSIS — Z992 Dependence on renal dialysis: Secondary | ICD-10-CM | POA: Diagnosis not present

## 2022-05-04 NOTE — Progress Notes (Signed)
Remote ICD transmission.   

## 2022-05-05 ENCOUNTER — Other Ambulatory Visit (HOSPITAL_COMMUNITY): Payer: Self-pay | Admitting: Cardiology

## 2022-05-05 DIAGNOSIS — Z95811 Presence of heart assist device: Secondary | ICD-10-CM

## 2022-05-05 DIAGNOSIS — N186 End stage renal disease: Secondary | ICD-10-CM | POA: Diagnosis not present

## 2022-05-05 DIAGNOSIS — I951 Orthostatic hypotension: Secondary | ICD-10-CM

## 2022-05-05 DIAGNOSIS — Z7901 Long term (current) use of anticoagulants: Secondary | ICD-10-CM

## 2022-05-05 DIAGNOSIS — I509 Heart failure, unspecified: Secondary | ICD-10-CM | POA: Diagnosis not present

## 2022-05-05 DIAGNOSIS — K3184 Gastroparesis: Secondary | ICD-10-CM

## 2022-05-05 DIAGNOSIS — Z992 Dependence on renal dialysis: Secondary | ICD-10-CM | POA: Diagnosis not present

## 2022-05-07 DIAGNOSIS — N2581 Secondary hyperparathyroidism of renal origin: Secondary | ICD-10-CM | POA: Diagnosis not present

## 2022-05-07 DIAGNOSIS — Z992 Dependence on renal dialysis: Secondary | ICD-10-CM | POA: Diagnosis not present

## 2022-05-07 DIAGNOSIS — N186 End stage renal disease: Secondary | ICD-10-CM | POA: Diagnosis not present

## 2022-05-08 ENCOUNTER — Ambulatory Visit (HOSPITAL_COMMUNITY): Payer: Self-pay | Admitting: Pharmacist

## 2022-05-08 LAB — POCT INR: INR: 2 (ref 2.0–3.0)

## 2022-05-09 DIAGNOSIS — Z992 Dependence on renal dialysis: Secondary | ICD-10-CM | POA: Diagnosis not present

## 2022-05-09 DIAGNOSIS — N2581 Secondary hyperparathyroidism of renal origin: Secondary | ICD-10-CM | POA: Diagnosis not present

## 2022-05-09 DIAGNOSIS — N186 End stage renal disease: Secondary | ICD-10-CM | POA: Diagnosis not present

## 2022-05-11 DIAGNOSIS — N2581 Secondary hyperparathyroidism of renal origin: Secondary | ICD-10-CM | POA: Diagnosis not present

## 2022-05-11 DIAGNOSIS — Z992 Dependence on renal dialysis: Secondary | ICD-10-CM | POA: Diagnosis not present

## 2022-05-11 DIAGNOSIS — N186 End stage renal disease: Secondary | ICD-10-CM | POA: Diagnosis not present

## 2022-05-14 DIAGNOSIS — Z992 Dependence on renal dialysis: Secondary | ICD-10-CM | POA: Diagnosis not present

## 2022-05-14 DIAGNOSIS — N186 End stage renal disease: Secondary | ICD-10-CM | POA: Diagnosis not present

## 2022-05-14 DIAGNOSIS — N2581 Secondary hyperparathyroidism of renal origin: Secondary | ICD-10-CM | POA: Diagnosis not present

## 2022-05-15 ENCOUNTER — Ambulatory Visit (HOSPITAL_COMMUNITY): Payer: Self-pay | Admitting: Pharmacist

## 2022-05-15 ENCOUNTER — Telehealth: Payer: Self-pay

## 2022-05-15 LAB — POCT INR: INR: 3.3 — AB (ref 2.0–3.0)

## 2022-05-15 NOTE — Telephone Encounter (Signed)
Pt states he do not keep the monitor plug in at all times because he do not have enough plugs. I explained to him if he get treated by his ICD we will not know until the next scheduled transmission or if he have any arrhythmias we will not know. The patient verbalized understanding.

## 2022-05-16 DIAGNOSIS — N2581 Secondary hyperparathyroidism of renal origin: Secondary | ICD-10-CM | POA: Diagnosis not present

## 2022-05-16 DIAGNOSIS — Z992 Dependence on renal dialysis: Secondary | ICD-10-CM | POA: Diagnosis not present

## 2022-05-16 DIAGNOSIS — N186 End stage renal disease: Secondary | ICD-10-CM | POA: Diagnosis not present

## 2022-05-18 DIAGNOSIS — Z992 Dependence on renal dialysis: Secondary | ICD-10-CM | POA: Diagnosis not present

## 2022-05-18 DIAGNOSIS — N2581 Secondary hyperparathyroidism of renal origin: Secondary | ICD-10-CM | POA: Diagnosis not present

## 2022-05-18 DIAGNOSIS — N186 End stage renal disease: Secondary | ICD-10-CM | POA: Diagnosis not present

## 2022-05-21 DIAGNOSIS — N186 End stage renal disease: Secondary | ICD-10-CM | POA: Diagnosis not present

## 2022-05-21 DIAGNOSIS — N2581 Secondary hyperparathyroidism of renal origin: Secondary | ICD-10-CM | POA: Diagnosis not present

## 2022-05-21 DIAGNOSIS — Z992 Dependence on renal dialysis: Secondary | ICD-10-CM | POA: Diagnosis not present

## 2022-05-22 ENCOUNTER — Ambulatory Visit (HOSPITAL_COMMUNITY): Payer: Self-pay | Admitting: Pharmacist

## 2022-05-22 DIAGNOSIS — Z7901 Long term (current) use of anticoagulants: Secondary | ICD-10-CM | POA: Diagnosis not present

## 2022-05-22 LAB — POCT INR: INR: 3.2 — AB (ref 2.0–3.0)

## 2022-05-23 ENCOUNTER — Other Ambulatory Visit (HOSPITAL_COMMUNITY): Payer: Self-pay | Admitting: Cardiology

## 2022-05-23 DIAGNOSIS — N2581 Secondary hyperparathyroidism of renal origin: Secondary | ICD-10-CM | POA: Diagnosis not present

## 2022-05-23 DIAGNOSIS — N186 End stage renal disease: Secondary | ICD-10-CM | POA: Diagnosis not present

## 2022-05-23 DIAGNOSIS — Z992 Dependence on renal dialysis: Secondary | ICD-10-CM | POA: Diagnosis not present

## 2022-05-23 DIAGNOSIS — G629 Polyneuropathy, unspecified: Secondary | ICD-10-CM

## 2022-05-24 ENCOUNTER — Ambulatory Visit (INDEPENDENT_AMBULATORY_CARE_PROVIDER_SITE_OTHER): Payer: Medicare HMO | Admitting: *Deleted

## 2022-05-24 DIAGNOSIS — Z794 Long term (current) use of insulin: Secondary | ICD-10-CM

## 2022-05-24 DIAGNOSIS — M2011 Hallux valgus (acquired), right foot: Secondary | ICD-10-CM | POA: Diagnosis not present

## 2022-05-24 DIAGNOSIS — M2012 Hallux valgus (acquired), left foot: Secondary | ICD-10-CM | POA: Diagnosis not present

## 2022-05-24 DIAGNOSIS — E1122 Type 2 diabetes mellitus with diabetic chronic kidney disease: Secondary | ICD-10-CM | POA: Diagnosis not present

## 2022-05-24 DIAGNOSIS — N186 End stage renal disease: Secondary | ICD-10-CM

## 2022-05-24 DIAGNOSIS — M2010 Hallux valgus (acquired), unspecified foot: Secondary | ICD-10-CM | POA: Diagnosis not present

## 2022-05-24 DIAGNOSIS — E1129 Type 2 diabetes mellitus with other diabetic kidney complication: Secondary | ICD-10-CM

## 2022-05-24 DIAGNOSIS — Z992 Dependence on renal dialysis: Secondary | ICD-10-CM

## 2022-05-24 NOTE — Progress Notes (Signed)
Patient presents today to pick up diabetic shoes and insoles.  Patient was dispensed 1 pair of diabetic shoes and 3 pairs of foam casted diabetic insoles. Fit was satisfactory. Instructions for break-in and wear was reviewed and a copy was given to the patient.   Re-appointment for regularly scheduled diabetic foot care visits or if they should experience any trouble with the shoes or insoles.  

## 2022-05-25 DIAGNOSIS — N2581 Secondary hyperparathyroidism of renal origin: Secondary | ICD-10-CM | POA: Diagnosis not present

## 2022-05-25 DIAGNOSIS — N186 End stage renal disease: Secondary | ICD-10-CM | POA: Diagnosis not present

## 2022-05-25 DIAGNOSIS — Z992 Dependence on renal dialysis: Secondary | ICD-10-CM | POA: Diagnosis not present

## 2022-05-28 DIAGNOSIS — Z992 Dependence on renal dialysis: Secondary | ICD-10-CM | POA: Diagnosis not present

## 2022-05-28 DIAGNOSIS — N186 End stage renal disease: Secondary | ICD-10-CM | POA: Diagnosis not present

## 2022-05-28 DIAGNOSIS — N2581 Secondary hyperparathyroidism of renal origin: Secondary | ICD-10-CM | POA: Diagnosis not present

## 2022-05-29 ENCOUNTER — Ambulatory Visit (HOSPITAL_COMMUNITY): Payer: Self-pay | Admitting: Pharmacist

## 2022-05-29 LAB — POCT INR: INR: 1.6 — AB (ref 2.0–3.0)

## 2022-05-30 DIAGNOSIS — N186 End stage renal disease: Secondary | ICD-10-CM | POA: Diagnosis not present

## 2022-05-30 DIAGNOSIS — Z992 Dependence on renal dialysis: Secondary | ICD-10-CM | POA: Diagnosis not present

## 2022-05-30 DIAGNOSIS — N2581 Secondary hyperparathyroidism of renal origin: Secondary | ICD-10-CM | POA: Diagnosis not present

## 2022-06-01 DIAGNOSIS — N186 End stage renal disease: Secondary | ICD-10-CM | POA: Diagnosis not present

## 2022-06-01 DIAGNOSIS — N2581 Secondary hyperparathyroidism of renal origin: Secondary | ICD-10-CM | POA: Diagnosis not present

## 2022-06-01 DIAGNOSIS — Z992 Dependence on renal dialysis: Secondary | ICD-10-CM | POA: Diagnosis not present

## 2022-06-04 DIAGNOSIS — Z992 Dependence on renal dialysis: Secondary | ICD-10-CM | POA: Diagnosis not present

## 2022-06-04 DIAGNOSIS — N2581 Secondary hyperparathyroidism of renal origin: Secondary | ICD-10-CM | POA: Diagnosis not present

## 2022-06-04 DIAGNOSIS — N186 End stage renal disease: Secondary | ICD-10-CM | POA: Diagnosis not present

## 2022-06-05 ENCOUNTER — Ambulatory Visit (HOSPITAL_COMMUNITY)
Admission: RE | Admit: 2022-06-05 | Discharge: 2022-06-05 | Disposition: A | Discharge status: Home / Self Care | Payer: Medicare HMO | Source: Ambulatory Visit | Attending: Cardiology | Admitting: Cardiology

## 2022-06-05 ENCOUNTER — Ambulatory Visit (HOSPITAL_COMMUNITY): Payer: Self-pay | Admitting: Pharmacist

## 2022-06-05 ENCOUNTER — Encounter (HOSPITAL_COMMUNITY): Payer: Self-pay

## 2022-06-05 VITALS — BP 113/91 | HR 90 | Wt 191.6 lb

## 2022-06-05 DIAGNOSIS — T827XXA Infection and inflammatory reaction due to other cardiac and vascular devices, implants and grafts, initial encounter: Secondary | ICD-10-CM | POA: Diagnosis not present

## 2022-06-05 DIAGNOSIS — Z792 Long term (current) use of antibiotics: Secondary | ICD-10-CM | POA: Insufficient documentation

## 2022-06-05 DIAGNOSIS — R531 Weakness: Secondary | ICD-10-CM | POA: Diagnosis not present

## 2022-06-05 DIAGNOSIS — Z951 Presence of aortocoronary bypass graft: Secondary | ICD-10-CM | POA: Diagnosis not present

## 2022-06-05 DIAGNOSIS — I5022 Chronic systolic (congestive) heart failure: Secondary | ICD-10-CM | POA: Insufficient documentation

## 2022-06-05 DIAGNOSIS — Z992 Dependence on renal dialysis: Secondary | ICD-10-CM | POA: Diagnosis not present

## 2022-06-05 DIAGNOSIS — Y718 Miscellaneous cardiovascular devices associated with adverse incidents, not elsewhere classified: Secondary | ICD-10-CM | POA: Insufficient documentation

## 2022-06-05 DIAGNOSIS — B9561 Methicillin susceptible Staphylococcus aureus infection as the cause of diseases classified elsewhere: Secondary | ICD-10-CM | POA: Insufficient documentation

## 2022-06-05 DIAGNOSIS — Z79899 Other long term (current) drug therapy: Secondary | ICD-10-CM | POA: Diagnosis not present

## 2022-06-05 DIAGNOSIS — I509 Heart failure, unspecified: Secondary | ICD-10-CM | POA: Diagnosis not present

## 2022-06-05 DIAGNOSIS — I5042 Chronic combined systolic (congestive) and diastolic (congestive) heart failure: Secondary | ICD-10-CM | POA: Diagnosis not present

## 2022-06-05 DIAGNOSIS — Z95811 Presence of heart assist device: Secondary | ICD-10-CM | POA: Diagnosis not present

## 2022-06-05 DIAGNOSIS — Y838 Other surgical procedures as the cause of abnormal reaction of the patient, or of later complication, without mention of misadventure at the time of the procedure: Secondary | ICD-10-CM | POA: Diagnosis not present

## 2022-06-05 DIAGNOSIS — I4892 Unspecified atrial flutter: Secondary | ICD-10-CM | POA: Diagnosis not present

## 2022-06-05 DIAGNOSIS — I4891 Unspecified atrial fibrillation: Secondary | ICD-10-CM | POA: Diagnosis not present

## 2022-06-05 DIAGNOSIS — E1122 Type 2 diabetes mellitus with diabetic chronic kidney disease: Secondary | ICD-10-CM | POA: Diagnosis not present

## 2022-06-05 DIAGNOSIS — Z7901 Long term (current) use of anticoagulants: Secondary | ICD-10-CM

## 2022-06-05 DIAGNOSIS — I2510 Atherosclerotic heart disease of native coronary artery without angina pectoris: Secondary | ICD-10-CM | POA: Insufficient documentation

## 2022-06-05 DIAGNOSIS — N186 End stage renal disease: Secondary | ICD-10-CM | POA: Diagnosis not present

## 2022-06-05 DIAGNOSIS — M79641 Pain in right hand: Secondary | ICD-10-CM | POA: Insufficient documentation

## 2022-06-05 DIAGNOSIS — I132 Hypertensive heart and chronic kidney disease with heart failure and with stage 5 chronic kidney disease, or end stage renal disease: Secondary | ICD-10-CM | POA: Insufficient documentation

## 2022-06-05 DIAGNOSIS — I255 Ischemic cardiomyopathy: Secondary | ICD-10-CM | POA: Insufficient documentation

## 2022-06-05 LAB — POCT INR: INR: 2.2 (ref 2.0–3.0)

## 2022-06-05 NOTE — Patient Instructions (Signed)
No medication changes today Coumadin per Lauren PharmD Return to clinic in 2 months for follow up with Dr Aundra Dubin

## 2022-06-05 NOTE — Progress Notes (Addendum)
Patient presents for 2 month f/u in [facility] today with brother [name]). Reports no problems with VAD equipment or concerns with drive line.  Patient arrived via wheelchair today. Pt denies falls, heart failure symptoms, or signs of bleeding.   Patient reports he continues M/W/F dialysis and that is dry weight is 83 kg. Pt confirms he takes Midodrine 15 mg TID on HD days only. Reports lightheadedness/dizziness after dialysis treatments, but this resolves with rest.    Pt on Keflex 500 mg BID (dosed for HD schedule). [name] reports they are changing dressing weekly with improvement in drainage from wound. Dressing changed today in clinic. Currently wearing 2 anchors. Reports he wears binder at home, but does not like to wear during dialysis. See documentation below for dressing change details.   Wt is up 10 lbs today. Reports he is eating well. Will discuss need to gently remove a little more fluid with treatments with Dr Joelyn Oms per Dr Aundra Dubin. Today's clinic note routed to Dr Joelyn Oms.   Reviewed labs from Dialysis center 05/17/22 with Dr. Aundra Dubin.   Vital Signs:  Doppler Pressure: 102 Automatc BP: 113/91 (100) HR: 90 SR O2: 100 % RA   Weight: 191.6 lbs Last wt: 180.4 lbs   VAD Indication: Destination Therapy due to CKD Stage 3b   LVAD assessment:  HM III: Speed: 5800 rpms Flow: 5.4 Power: 4.7 w    PI: 3.6 Alarms: none Events: > 90 PI events yesterday with HD  Fixed speed:  5800 Low speed limit:  5500  Primary controller: back up battery due for replacement in 12 months Secondary controller:  back up battery due for replacement in 16 months  I reviewed the LVAD parameters from today and compared the results to the patient's prior recorded data. LVAD interrogation was NEGATIVE for significant power changes, NEGATIVE for clinical alarms and STABLE for PI events/speed drops. No programming changes were made and pump is functioning within specified parameters. Pt is performing  daily controller and system monitor self tests along with completing weekly and monthly maintenance for LVAD equipment.   LVAD equipment check completed and is in good working order. Back-up equipment present.    Annual Equipment Maintenance on UBC/PM was performed 10/12/21.  Exit Site Care: Dressing maintained weekly by pt's son. Gauze VAD dressing and anchor removed and site care performed using sterile technique. Drive line exit site cleaned with Chlora prep applicators x 2, allowed to dry before gauze dressing NO SILVER STRIP applied. Exit site incorporated. The velour is fully implanted at exit site. No drainage, redness, tenderness, or foul odor noted. Small dried scab at exit site removed with cleansing. Drive line anchor re-applied x 2. Pt denies fever or chills. May continue weekly dressing using daily kits. Provided with 15 anchors for home use.   Device: Medtronic single Therapies: on 182 bpm Pacing: VVI 40 Last check:  02/28/22   BP & Labs:  Doppler 102 - Doppler is reflecting modified systolic   Hgb 96.2- No S/S of bleeding. Specifically denies melena/BRBPR or nosebleeds.   LDH not collected - established baseline of 160 - 180. Denies tea-colored urine. No power elevations noted on interrogation.     Patient Instructions: No medication changes today Coumadin per Lauren PharmD Return to clinic in 2 months for follow up with Dr Dwyane Dee RN [employer] Coordinator  Office: [phone]  24/7 Pager: [phone]    Follow up for Heart Failure/LVAD:  Ellamae Sia Amani Nodarse. is a 61 y.o. male who  has a h/o DM2, CAD s/p CABG 4158, systolic HF due to ischemic cardiomyopathy with EF 20-25% (echo 12/15), DM2 and CKD. He is s/p Medtronic single chamber ICD.   Admitted in 12/15 due to ADHF. Required short course milrinone for diuresis. Diuresed 30 pounds.    CPX 2/16 showed severely reduced functional capacity.  There was a significant disconnect between his symptoms and  the CPX.  RHC in 6/16 showed fairly normal filling pressures and low but not markedly low cardiac output.    CPX (4/19) was submaximal but suggested severe limitation from HF.    He was admitted in 6/19 with marked volume overload and dyspnea.  Echo in 6/19 showed EF 20% with severe global hypokinesis.  LHC/RHC showed severe native vessel CAD with patent LIMA-D, SVG-OM, and SVG-PLV; cardiac index low.  He was started on milrinone and diuresed.  We discussed LVAD extensively in light of low output, creatinine that is trending up and concern for decompensation of RV over time. He was adamant that he did not want to pursue LVAD workup yet and did not want a referral for transplant evaluation. Milrinone was weaned off and he was discharged home.    Admitted 08/01/18-08/07/18 with volume overload and left foot wound. Diuresed 38 lbs with IV lasix and then transitioned back to torsemide 100 mg BID. ID was consulted with concerns for left foot osteomyelitis noted on CT scan. He got IV ancef q8 hours x 42 days. [facility] provided teaching and supplies for home antibiotics. Course complicated by AKI. DC weight 200 lbs.    He went into atrial flutter and had DCCV to NSR in 1/20.    Echo in 4/21 showed EF 25% with apical akinesis and diffuse hypokinesis relatively preserved in lateral wall, mildly decreased RV systolic function, PASP 41 mmHg. CPX in 5/21 showed severe functional limitation suggestive of advanced HF. 5/21 Zio patch showed short atrial flutter runs, 3.8% PVCs.    He developed COVID-19 infection in 1/22.  Symptoms were predominantly GI.    He developed progressive worsening of CHF in early 2022 and was admitted in 3/22 with low output HF.  Impella 5.5 was placed as bridge to Heartmate III LVAD placed in 10/13/20.  He developed post-op renal failure and ended up on dialysis.  He was markedly deconditioned post-op and went to CIR.    Still has pain and minimal function in his right hand.  Nerve conduction  study showed a chronic sensorimotor neuropathy in the right arm.    Ramp echo in 3/23 showed severe LV dysfunction with mild RV enlargement and mild dysfunction, mild-moderate AI.  Speed was increased to 5800 rpm.  The aortic valve still opened with every beat but only slightly. Flow increased from 4.1 to 5.3 L/min and PI remained stable.   Patient was admitted in 4/23 with falls and lightheadedness. He fractured his left foot when falling and initially was unable to walk.  He was thought to be hypotensive due to over-diuresis and dry weight was increased.  Midodrine on HD days was also increased.  He was treated for a driveline infection with vancomycin then doxycycline, now off.   ICD shock for VT in 7/23, not started on amiodarone given only 1 episode.   Patient returns today for followup of CHF/LVAD.  No VT.  Still feels weak after HD.  Using midodrine only on HD days.  MAP 100 today.  His right hand is getting stronger.  Weight is up today.  No further ICD  shocks.  Driveline site looks good today with no drainage.  He denies dyspnea walking on flat ground.   I reviewed the LVAD parameters from today, and compared the results to the patient's prior recorded data.  No programming changes were made.  The LVAD is functioning within specified parameters.  The patient performs LVAD self-test daily.  LVAD interrogation was negative for any significant power changes, alarms or PI events/speed drops.  LVAD equipment check completed and is in good working order.  Back-up equipment present.   LVAD education done on emergency procedures and precautions and reviewed exit site care.  Labs (5/22): LDH 174, hgb 10.6 Labs (7/22): LDH 164 Labs (8/22): hgb 11.4 Labs (10/22): hgb 12 Labs (11/22): hgb 12.2 Labs (4/23): LDL 163, hgb 10.4 Labs (6/23): hgb 12.2, LDH 284 Labs (7/23): LFTs normal, hgb 11.3, LDH 235 Labs (9/23): hgb 10.2   Past Medical History:  Diagnosis Date   AICD (automatic  cardioverter/defibrillator) present    Single-chamber  implantable cardiac defibrillator - Medtronic   Atrial fibrillation ([place])    Cataract    Mixed form OU   CHF (congestive heart failure) ([place])    Chronic kidney disease    Chronic kidney disease (CKD), stage III (moderate) (HCC)    Chronic systolic heart failure ([place])    a. Echo 5/13: Mild LVE, mild LVH, EF 10%, anteroseptal, lateral, apical AK, mild MR, mild LAE, moderately reduced RVSF, mild RAE, PASP 60;  b. 07/2014 Echo: EF 20-2%, diff HK, AKI of antsep/apical/mid-apicalinferior, mod reduced RV.   Coronary artery disease    a. s/p CABG 2002 b. LHC 5/13:  dLM 80%, LAD subtotally occluded, pCFX occluded, pRCA 50%, mid? Occlusion with high take off of the PDA with 70% multiple lesions-not bypassed and supplies collaterals to LAD, LIMA-IM/ramus ok, S-OM ok, S-PLA branches ok. Medical therapy was recommended   COVID    Diabetic retinopathy ([place])    NPDR OD, PDR OS   Dyspnea    Gout    "on daily RX" (01/08/2018)   Hypertension    Hypertensive retinopathy    OU   Ischemic cardiomyopathy    a. Echo 5/13: Mild LVE, mild LVH, EF 10%, anteroseptal, lateral, apical AK, mild MR, mild LAE, moderately reduced RVSF, mild RAE, PASP 60;  b. 01/2012 s/p MDT [id number] Protecta XT VR AICD;  c. 07/2014 Echo: EF 20-2%, diff HK, AKI of antsep/apical/mid-apicalinferior, mod reduced RV.   MRSA (methicillin resistant Staphylococcus aureus)    Status post right foot plantar deep infection with MRSA status post  I&D 10/2008   Myocardial infarction [facility])    "was told I'd had several before heart OR in 2002" (01/08/2018)   Noncompliance    Peripheral neuropathy    Retinopathy, diabetic, background ([place])    Syncope    Type II diabetes mellitus ([place])    requiring insulin    Vitreous hemorrhage, left (HCC)    and proliferative diabetic retinopathy    Current Outpatient Medications  Medication Sig Dispense Refill   atorvastatin (LIPITOR) 40 MG tablet Take 1  tablet (40 mg total) by mouth daily.     cephALEXin (KEFLEX) 500 MG capsule Take 1 capsule (500 mg total) by mouth 2 (two) times daily. Take evening dose after dialysis on HD days. 120 capsule 1   gabapentin (NEURONTIN) 100 MG capsule TAKE 1-2 CAPSULES (100-200 MG TOTAL) BY MOUTH SEE ADMIN INSTRUCTIONS. TAKING 1 CAPSULE ( 100 MG) IN THE AM AND 2 CAPSULES ( 200 MG) AT BEDTIME 90  capsule 3   insulin glargine (LANTUS SOLOSTAR) 100 UNIT/ML Solostar Pen Inject 60 Units into the skin at bedtime. (Patient taking differently: Inject 50 Units into the skin daily as needed.) 30 mL 11   midodrine (PROAMATINE) 10 MG tablet Take 1.5 tablets (15 mg total) by mouth 3 (three) times daily. Three times a day on dialysis days; NO midodrine on NON dialysis days 135 tablet 11   multivitamin (RENA-VIT) TABS tablet TAKE 1 TABLET BY MOUTH EVERYDAY AT BEDTIME 90 tablet 3   pantoprazole (PROTONIX) 40 MG tablet Take 1 tablet (40 mg total) by mouth daily. 90 tablet 3   rosuvastatin (CRESTOR) 10 MG tablet TAKE 1 TABLET BY MOUTH EVERY DAY 90 tablet 3   sevelamer (RENAGEL) 800 MG tablet Take 800 mg by mouth 3 (three) times daily.     warfarin (COUMADIN) 3 MG tablet TAKE 1.5 TABLET EVERY TUESDAY/SATURDAY AND 1 TAB ALL OTHER DAYS OR AS DIRECTED BY HF CLINIC 103 tablet 6   naphazoline-glycerin (CLEAR EYES REDNESS) 0.012-0.25 % SOLN Place 1-2 drops into the right eye 4 (four) times daily as needed for eye irritation. (Patient not taking: Reported on 06/05/2022)     traMADol (ULTRAM) 50 MG tablet Take 1 tablet (50 mg total) by mouth every 12 (twelve) hours as needed. (Patient not taking: Reported on 06/05/2022) 30 tablet 0   No current facility-administered medications for this encounter.    Meclizine hcl and Ivabradine  REVIEW OF SYSTEMS: All systems negative except as listed in HPI, PMH and Problem list.   LVAD INTERROGATION:  Please see LVAD nurse's note above.   BP (!) 113/91 Comment: map 100  Pulse 90   Wt 86.9 kg (191  lb 9.6 oz)   SpO2 100%   BMI 27.49 kg/m  MAP 100  Physical Exam: General: Well appearing this am. NAD.  HEENT: Normal. Neck: Supple, JVP 8-9 cm. Carotids OK.  Cardiac:  Mechanical heart sounds with LVAD hum present.  Lungs:  CTAB, normal effort.  Abdomen:  NT, ND, no HSM. No bruits or masses. +BS  LVAD exit site: Well-healed and incorporated. Dressing dry and intact. No erythema or drainage. Stabilization device present and accurately applied. Driveline dressing changed daily per sterile technique. Extremities:  Warm and dry. No cyanosis, clubbing, rash. Trace ankle edema.  Neuro:  Alert & oriented x 3. Cranial nerves grossly intact. Moves all 4 extremities w/o difficulty. Affect pleasant    ASSESSMENT AND PLAN:  1. Chronic systolic CHF:  Ischemic cardiomyopathy. Has Medtronic ICD. Low output HF, admitted and Impella 5.5 placed 10/04/20.  HM3 LVAD was placed on 10/13/20. Developed post-op renal failure requiring eventual hemodialysis.  Ramp echo in 3/23, speed increased to 5800 rpm under echo guidance with increase in flow and stable PI.  MAP mildly elevated but drops on HD days (takes midodrine on HD days).  Mild volume overload on exam today.    - Continue midodrine 15 mg tid on HD days.   - He looks like he needs a bit more fluid off with dialysis.  This is a delicate balance with him, however.  2. LVAD:  VAD interrogated personally.  Parameters stable with PI events on HD days.  - He is now off ASA.  - Continue warfarin with INR goal 2-2.5.   3. CAD s/p CABG 2002:  Last cath in 6/19 with patent grafts. No chest pain.  - Continue Crestor.  4. ESRD: Currently dialyzing via tunneled catheter. No low flow events.  - Continue HD  M-W-F  - With continuous flow LVAD, think AV fistula would be unlikely to mature.   5. Atrial flutter/fibrillation: s/p DC-CV 08/21/18.  Has occasional episodes of AF but not prolonged.  6. Right hand pain/weakness: chronic sensorimotor neuropathy from stretch of  brachial plexus with Impella placement. This has been improving, his grip is better.  - Continue gabapentin 7. DM: Per primary care.  8. Anemia: Hgb stable on recent labs in 9/23.    9. Suspected Gastroparesis. Nausea improved on Reglan.  10. VT: Episode in 7/23 with successful ICD discharge.  No definite trigger, he did not actually feel the VT. No further episodes.  - As this was his first episode and there has been no recurrence, we did not start amiodarone.  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 Driveline site looks good today.   Loralie Champagne 06/05/2022

## 2022-06-06 DIAGNOSIS — Z992 Dependence on renal dialysis: Secondary | ICD-10-CM | POA: Diagnosis not present

## 2022-06-06 DIAGNOSIS — N2581 Secondary hyperparathyroidism of renal origin: Secondary | ICD-10-CM | POA: Diagnosis not present

## 2022-06-06 DIAGNOSIS — N186 End stage renal disease: Secondary | ICD-10-CM | POA: Diagnosis not present

## 2022-06-08 DIAGNOSIS — N2581 Secondary hyperparathyroidism of renal origin: Secondary | ICD-10-CM | POA: Diagnosis not present

## 2022-06-08 DIAGNOSIS — Z992 Dependence on renal dialysis: Secondary | ICD-10-CM | POA: Diagnosis not present

## 2022-06-08 DIAGNOSIS — N186 End stage renal disease: Secondary | ICD-10-CM | POA: Diagnosis not present

## 2022-06-11 DIAGNOSIS — N2581 Secondary hyperparathyroidism of renal origin: Secondary | ICD-10-CM | POA: Diagnosis not present

## 2022-06-11 DIAGNOSIS — N186 End stage renal disease: Secondary | ICD-10-CM | POA: Diagnosis not present

## 2022-06-11 DIAGNOSIS — Z992 Dependence on renal dialysis: Secondary | ICD-10-CM | POA: Diagnosis not present

## 2022-06-12 ENCOUNTER — Ambulatory Visit (HOSPITAL_COMMUNITY): Payer: Self-pay | Admitting: Pharmacist

## 2022-06-12 LAB — POCT INR: INR: 2.4 (ref 2.0–3.0)

## 2022-06-13 DIAGNOSIS — Z992 Dependence on renal dialysis: Secondary | ICD-10-CM | POA: Diagnosis not present

## 2022-06-13 DIAGNOSIS — N2581 Secondary hyperparathyroidism of renal origin: Secondary | ICD-10-CM | POA: Diagnosis not present

## 2022-06-13 DIAGNOSIS — N186 End stage renal disease: Secondary | ICD-10-CM | POA: Diagnosis not present

## 2022-06-15 DIAGNOSIS — Z992 Dependence on renal dialysis: Secondary | ICD-10-CM | POA: Diagnosis not present

## 2022-06-15 DIAGNOSIS — N2581 Secondary hyperparathyroidism of renal origin: Secondary | ICD-10-CM | POA: Diagnosis not present

## 2022-06-15 DIAGNOSIS — N186 End stage renal disease: Secondary | ICD-10-CM | POA: Diagnosis not present

## 2022-06-18 DIAGNOSIS — N186 End stage renal disease: Secondary | ICD-10-CM | POA: Diagnosis not present

## 2022-06-18 DIAGNOSIS — N2581 Secondary hyperparathyroidism of renal origin: Secondary | ICD-10-CM | POA: Diagnosis not present

## 2022-06-18 DIAGNOSIS — Z992 Dependence on renal dialysis: Secondary | ICD-10-CM | POA: Diagnosis not present

## 2022-06-19 ENCOUNTER — Ambulatory Visit (HOSPITAL_COMMUNITY): Payer: Self-pay | Admitting: Pharmacist

## 2022-06-19 ENCOUNTER — Ambulatory Visit: Payer: Medicare HMO | Admitting: Internal Medicine

## 2022-06-19 DIAGNOSIS — Z7901 Long term (current) use of anticoagulants: Secondary | ICD-10-CM | POA: Diagnosis not present

## 2022-06-19 LAB — POCT INR: INR: 2.2 (ref 2.0–3.0)

## 2022-06-20 DIAGNOSIS — Z992 Dependence on renal dialysis: Secondary | ICD-10-CM | POA: Diagnosis not present

## 2022-06-20 DIAGNOSIS — N2581 Secondary hyperparathyroidism of renal origin: Secondary | ICD-10-CM | POA: Diagnosis not present

## 2022-06-20 DIAGNOSIS — N186 End stage renal disease: Secondary | ICD-10-CM | POA: Diagnosis not present

## 2022-06-22 DIAGNOSIS — Z992 Dependence on renal dialysis: Secondary | ICD-10-CM | POA: Diagnosis not present

## 2022-06-22 DIAGNOSIS — N186 End stage renal disease: Secondary | ICD-10-CM | POA: Diagnosis not present

## 2022-06-22 DIAGNOSIS — N2581 Secondary hyperparathyroidism of renal origin: Secondary | ICD-10-CM | POA: Diagnosis not present

## 2022-06-24 DIAGNOSIS — N186 End stage renal disease: Secondary | ICD-10-CM | POA: Diagnosis not present

## 2022-06-24 DIAGNOSIS — N2581 Secondary hyperparathyroidism of renal origin: Secondary | ICD-10-CM | POA: Diagnosis not present

## 2022-06-24 DIAGNOSIS — Z992 Dependence on renal dialysis: Secondary | ICD-10-CM | POA: Diagnosis not present

## 2022-06-26 ENCOUNTER — Ambulatory Visit (HOSPITAL_COMMUNITY): Payer: Self-pay | Admitting: Pharmacist

## 2022-06-26 DIAGNOSIS — N186 End stage renal disease: Secondary | ICD-10-CM | POA: Diagnosis not present

## 2022-06-26 DIAGNOSIS — N2581 Secondary hyperparathyroidism of renal origin: Secondary | ICD-10-CM | POA: Diagnosis not present

## 2022-06-26 DIAGNOSIS — Z992 Dependence on renal dialysis: Secondary | ICD-10-CM | POA: Diagnosis not present

## 2022-06-26 LAB — POCT INR: INR: 1.9 — AB (ref 2.0–3.0)

## 2022-06-27 ENCOUNTER — Encounter: Payer: Self-pay | Admitting: Podiatry

## 2022-06-27 ENCOUNTER — Ambulatory Visit (INDEPENDENT_AMBULATORY_CARE_PROVIDER_SITE_OTHER): Payer: Medicare HMO | Admitting: Podiatry

## 2022-06-27 DIAGNOSIS — M79674 Pain in right toe(s): Secondary | ICD-10-CM | POA: Diagnosis not present

## 2022-06-27 DIAGNOSIS — Z794 Long term (current) use of insulin: Secondary | ICD-10-CM

## 2022-06-27 DIAGNOSIS — Z992 Dependence on renal dialysis: Secondary | ICD-10-CM

## 2022-06-27 DIAGNOSIS — M79675 Pain in left toe(s): Secondary | ICD-10-CM

## 2022-06-27 DIAGNOSIS — E1122 Type 2 diabetes mellitus with diabetic chronic kidney disease: Secondary | ICD-10-CM | POA: Diagnosis not present

## 2022-06-27 DIAGNOSIS — N179 Acute kidney failure, unspecified: Secondary | ICD-10-CM

## 2022-06-27 DIAGNOSIS — N183 Chronic kidney disease, stage 3 unspecified: Secondary | ICD-10-CM | POA: Diagnosis not present

## 2022-06-27 DIAGNOSIS — B351 Tinea unguium: Secondary | ICD-10-CM

## 2022-06-27 DIAGNOSIS — N186 End stage renal disease: Secondary | ICD-10-CM

## 2022-06-27 NOTE — Progress Notes (Signed)
This patient presents  to my office for at risk foot care.  This patient requires this care by a professional since this patient will be at risk due to having  CKD, coagulation defect and DM.  This patient is unable to cut nails himself since the patient cannot reach his nails.These nails are painful walking and wearing shoes.  This patient presents for at risk foot care today.  General Appearance  Alert, conversant and in no acute stress.  Vascular  Dorsalis pedis and posterior tibial  pulses are absent  bilaterally.  Capillary return is within normal limits  bilaterally. Temperature is within normal limits  bilaterally.  Neurologic  Senn-Weinstein monofilament wire test absent  bilaterally. Muscle power within normal limits bilaterally.  Nails Thick disfigured discolored nails with subungual debris  from hallux to fifth toes bilaterally. No evidence of bacterial infection or drainage bilaterally.  Orthopedic  No limitations of motion  feet .  No crepitus or effusions noted.  No bony pathology or digital deformities noted.  No  STJ ROM left foot.  Skin  normotropic skin with no porokeratosis noted bilaterally.  No signs of infections or ulcers noted.   Callus right midfoot.  Onychomycosis  Pain in right toes  Pain in left toes  Consent was obtained for treatment procedures.   Mechanical debridement of nails 1-5  bilaterally performed with a nail nipper.  Filed with dremel without incident.   Debride callus right foot with dremel tool.  To consider diabetic shoes in future.   Return office visit     3 months                 Told patient to return for periodic foot care and evaluation due to potential at risk complications.   Gardiner Barefoot DPM

## 2022-06-29 DIAGNOSIS — N186 End stage renal disease: Secondary | ICD-10-CM | POA: Diagnosis not present

## 2022-06-29 DIAGNOSIS — Z992 Dependence on renal dialysis: Secondary | ICD-10-CM | POA: Diagnosis not present

## 2022-06-29 DIAGNOSIS — N2581 Secondary hyperparathyroidism of renal origin: Secondary | ICD-10-CM | POA: Diagnosis not present

## 2022-07-02 DIAGNOSIS — N186 End stage renal disease: Secondary | ICD-10-CM | POA: Diagnosis not present

## 2022-07-02 DIAGNOSIS — Z992 Dependence on renal dialysis: Secondary | ICD-10-CM | POA: Diagnosis not present

## 2022-07-02 DIAGNOSIS — N2581 Secondary hyperparathyroidism of renal origin: Secondary | ICD-10-CM | POA: Diagnosis not present

## 2022-07-03 ENCOUNTER — Ambulatory Visit (HOSPITAL_COMMUNITY): Payer: Self-pay | Admitting: Pharmacist

## 2022-07-03 ENCOUNTER — Other Ambulatory Visit: Payer: Self-pay

## 2022-07-03 ENCOUNTER — Telehealth (INDEPENDENT_AMBULATORY_CARE_PROVIDER_SITE_OTHER): Payer: Medicare HMO | Admitting: Internal Medicine

## 2022-07-03 ENCOUNTER — Encounter: Payer: Self-pay | Admitting: Internal Medicine

## 2022-07-03 DIAGNOSIS — N186 End stage renal disease: Secondary | ICD-10-CM

## 2022-07-03 DIAGNOSIS — Z992 Dependence on renal dialysis: Secondary | ICD-10-CM

## 2022-07-03 DIAGNOSIS — Z95811 Presence of heart assist device: Secondary | ICD-10-CM | POA: Diagnosis not present

## 2022-07-03 DIAGNOSIS — T827XXA Infection and inflammatory reaction due to other cardiac and vascular devices, implants and grafts, initial encounter: Secondary | ICD-10-CM

## 2022-07-03 DIAGNOSIS — A4901 Methicillin susceptible Staphylococcus aureus infection, unspecified site: Secondary | ICD-10-CM | POA: Diagnosis not present

## 2022-07-03 LAB — POCT INR: INR: 1.7 — AB (ref 2.0–3.0)

## 2022-07-03 MED ORDER — CEPHALEXIN 500 MG PO CAPS: 500.0000 mg | ORAL_CAPSULE | Freq: Two times a day (BID) | ORAL | 1 refills | Status: DC

## 2022-07-03 NOTE — Assessment & Plan Note (Signed)
Patient here today via video visit for routine follow up of LVAD driveline infection.  Recent follow up at heart failure clinic noted site looked good and patient with no new complaints today.  He is tolerating Keflex '500mg'$  BID dosed for HD schedule and will continue with this.  Refills sent today.  Discussed with patient that anticipation he may be on antibiotics indefinitely and that once drive line or prosthetic material is infected it will presumably always be the case (especially with MSSA) unless explanted which is often times prohibitive particularly in this case. RTC 3 months.

## 2022-07-03 NOTE — Progress Notes (Signed)
[facility]  CHIEF COMPLAINT:    Follow up for LVAD driveline infection  SUBJECTIVE:    Marcus Johnson. is a 61 y.o. 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   Patient here today for follow up after initial visit on 02/22/22.  Per that note, patient was seen in the [place] clinic 02/13/22 after noticing blood and pus drainage from his site.  Patient had wound culture obtained at that time and was referred to our clinic in addition to being started on doxycycline.  He was noted to have tan/green drainage from wound bed at that time with site tunneling 7 cm.  He has been on doxycycline since that time and reports improvement but still has some bleeding/drainage.  He has no fevers, chills.  The Doxycycline is causing some GI disturbance.  His cultures have grown MSSA.  He also grew MSSA from a driveline wound culture in February 2023.   At visit on 02/22/22 we changed to Keflex '500mg'$  BID dosed for HD schedule.  I saw him last on 03/29/22 and was continued on Keflex.  He reports improvement in drainage from  wound.  He saw the HF clinic on 06/05/22.  Dressing change that date noted no drainage, redness, tenderness, or odor.    He is doing very well today with no issues.  He had a nice Thanksgiving holiday and only "complaint" is that he ate too much.  Please see A&P for the details of today's visit and status of the patient's medical problems.   Patient's Medications  New Prescriptions   No medications on file  Previous Medications   ATORVASTATIN (LIPITOR) 40 MG TABLET    Take 1 tablet (40 mg total) by mouth daily.   GABAPENTIN (NEURONTIN) 100 MG CAPSULE    TAKE 1-2 CAPSULES (100-200 MG TOTAL) BY MOUTH SEE ADMIN INSTRUCTIONS. TAKING 1 CAPSULE ( 100 MG) IN THE AM AND 2 CAPSULES ( 200 MG) AT BEDTIME   INSULIN GLARGINE (LANTUS SOLOSTAR) 100 UNIT/ML SOLOSTAR PEN    Inject 60 Units into the skin at  bedtime.   MIDODRINE (PROAMATINE) 10 MG TABLET    Take 1.5 tablets (15 mg total) by mouth 3 (three) times daily. Three times a day on dialysis days; NO midodrine on NON dialysis days   MULTIVITAMIN (RENA-VIT) TABS TABLET    TAKE 1 TABLET BY MOUTH EVERYDAY AT BEDTIME   NAPHAZOLINE-GLYCERIN (CLEAR EYES REDNESS) 0.012-0.25 % SOLN    Place 1-2 drops into the right eye 4 (four) times daily as needed for eye irritation.   PANTOPRAZOLE (PROTONIX) 40 MG TABLET    Take 1 tablet (40 mg total) by mouth daily.   ROSUVASTATIN (CRESTOR) 10 MG TABLET    TAKE 1 TABLET BY MOUTH EVERY DAY   SEVELAMER (RENAGEL) 800 MG TABLET    Take 800 mg by mouth 3 (three) times daily.   TRAMADOL (ULTRAM) 50 MG TABLET    Take 1 tablet (50 mg total) by mouth every 12 (twelve) hours as needed.   WARFARIN (COUMADIN) 3 MG TABLET    TAKE 1.5 TABLET EVERY TUESDAY/SATURDAY AND 1 TAB ALL OTHER DAYS OR AS DIRECTED BY HF CLINIC  Modified Medications   Modified Medication Previous Medication   CEPHALEXIN (KEFLEX) 500 MG CAPSULE cephALEXin (KEFLEX) 500 MG capsule      Take 1 capsule (500 mg total) by mouth 2 (two) times daily. Take  evening dose after dialysis on HD days.    Take 1 capsule (500 mg total) by mouth 2 (two) times daily. Take evening dose after dialysis on HD days.  Discontinued Medications   No medications on file      Past Medical History:  Diagnosis Date   AICD (automatic cardioverter/defibrillator) present    Single-chamber  implantable cardiac defibrillator - [employer]   Atrial fibrillation (HCC)    Cataract    Mixed form OU   CHF (congestive heart failure) (HCC)    Chronic kidney disease    Chronic kidney disease (CKD), stage III (moderate) (HCC)    Chronic systolic heart failure ([place])    a. Echo 5/13: Mild LVE, mild LVH, EF 10%, anteroseptal, lateral, apical AK, mild MR, mild LAE, moderately reduced RVSF, mild RAE, PASP 60;  b. 07/2014 Echo: EF 20-2%, diff HK, AKI of antsep/apical/mid-apicalinferior, mod reduced  RV.   Coronary artery disease    a. s/p CABG 2002 b. LHC 5/13:  dLM 80%, LAD subtotally occluded, pCFX occluded, pRCA 50%, mid? Occlusion with high take off of the PDA with 70% multiple lesions-not bypassed and supplies collaterals to LAD, LIMA-IM/ramus ok, S-OM ok, S-PLA branches ok. Medical therapy was recommended   COVID    Diabetic retinopathy ([place])    NPDR OD, PDR OS   Dyspnea    Gout    "on daily RX" (01/08/2018)   Hypertension    Hypertensive retinopathy    OU   Ischemic cardiomyopathy    a. Echo 5/13: Mild LVE, mild LVH, EF 10%, anteroseptal, lateral, apical AK, mild MR, mild LAE, moderately reduced RVSF, mild RAE, PASP 60;  b. 01/2012 s/p MDT [id number] Protecta XT VR AICD;  c. 07/2014 Echo: EF 20-2%, diff HK, AKI of antsep/apical/mid-apicalinferior, mod reduced RV.   MRSA (methicillin resistant Staphylococcus aureus)    Status post right foot plantar deep infection with MRSA status post  I&D 10/2008   Myocardial infarction [facility])    "was told I'd had several before heart OR in 2002" (01/08/2018)   Noncompliance    Peripheral neuropathy    Retinopathy, diabetic, background ([place])    Syncope    Type II diabetes mellitus ([place])    requiring insulin    Vitreous hemorrhage, left (HCC)    and proliferative diabetic retinopathy    Social History   Tobacco Use   Smoking status: Never   Smokeless tobacco: Never  Vaping Use   Vaping Use: Never used  Substance Use Topics   Alcohol use: No    Alcohol/week: 0.0 standard drinks of alcohol   Drug use: No    Family History  Problem Relation Age of Onset   Diabetes Father        died in his 69's   Hypertension Mother        died in her 26's - had a ppm.   Amblyopia Neg Hx    Blindness Neg Hx    Cataracts Neg Hx    Glaucoma Neg Hx    Macular degeneration Neg Hx    Retinal detachment Neg Hx    Strabismus Neg Hx    Retinitis pigmentosa Neg Hx     Allergies  Allergen Reactions   Meclizine Hcl Anaphylaxis and Swelling   Ivabradine  Nausea Only    Review of Systems  Constitutional: Negative.   Respiratory: Negative.    Cardiovascular: Negative.   Gastrointestinal: Negative.   All other systems reviewed and are negative.    OBJECTIVE:  There were no vitals filed for this visit. There is no height or weight on file to calculate BMI.  Physical Exam Constitutional:      General: He is not in acute distress.    Appearance: Normal appearance.     Comments: He appears via video in NAD and in good spirits.   Neurological:     Mental Status: He is alert.      Labs and Microbiology:    Latest Ref Rng & Units 02/28/2022   10:50 AM 11/08/2021   12:48 AM 11/07/2021    1:10 AM  CBC  WBC 4.0 - 10.5 K/uL 7.7  7.7  7.2   Hemoglobin 13.0 - 17.0 g/dL 11.3  9.4  9.8   Hematocrit 39.0 - 52.0 % 34.1  28.0  29.9   Platelets 150 - 400 K/uL 166  157  153       Latest Ref Rng & Units 02/28/2022   10:50 AM 11/08/2021   12:48 AM 11/07/2021    1:10 AM  CMP  Glucose 70 - 99 mg/dL 192  96  111   BUN 8 - 23 mg/dL 67  42  23   Creatinine 0.61 - 1.24 mg/dL 6.80  4.10  2.97   Sodium 135 - 145 mmol/L 139  135  138   Potassium 3.5 - 5.1 mmol/L 4.2  4.2  3.9   Chloride 98 - 111 mmol/L 99  101  103   CO2 22 - 32 mmol/L '27  26  27   '$ Calcium 8.9 - 10.3 mg/dL 9.2  9.5  9.1   Total Protein 6.5 - 8.1 g/dL 7.7     Total Bilirubin 0.3 - 1.2 mg/dL 0.7     Alkaline Phos 38 - 126 U/L 109     AST 15 - 41 U/L 38     ALT 0 - 44 U/L 23         ASSESSMENT & PLAN:    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  Recent follow up at heart failure clinic noted site looked good and patient with no new complaints today.  He is tolerating Keflex '500mg'$  BID dosed for HD schedule and will continue with this.  Refills sent today.  Discussed with patient that anticipation he may be on antibiotics indefinitely and that once drive line or prosthetic material is  infected it will presumably always be the case (especially with MSSA) unless explanted which is often times prohibitive particularly in this case. RTC 3 months.      [facility] 07/03/2022, 8:44 AM   Virtual Visit via Video Note   I connected with Marcus Johnson. on 07/03/22 at 8:44 AM by video and verified that I am speaking with the correct person using two identifiers.   I discussed the limitations, risks, security and privacy concerns of performing an evaluation and management service by telephone and the availability of in person appointments. I also discussed with the patient that there may be a patient responsible charge related to this service. The patient expressed understanding and agreed to proceed.  Patient location: home My location: rcid Duration of call: 4 minutes (total time in chart = 12 min)

## 2022-07-04 DIAGNOSIS — N186 End stage renal disease: Secondary | ICD-10-CM | POA: Diagnosis not present

## 2022-07-04 DIAGNOSIS — N2581 Secondary hyperparathyroidism of renal origin: Secondary | ICD-10-CM | POA: Diagnosis not present

## 2022-07-04 DIAGNOSIS — Z992 Dependence on renal dialysis: Secondary | ICD-10-CM | POA: Diagnosis not present

## 2022-07-05 DIAGNOSIS — Z992 Dependence on renal dialysis: Secondary | ICD-10-CM | POA: Diagnosis not present

## 2022-07-05 DIAGNOSIS — I509 Heart failure, unspecified: Secondary | ICD-10-CM | POA: Diagnosis not present

## 2022-07-05 DIAGNOSIS — N186 End stage renal disease: Secondary | ICD-10-CM | POA: Diagnosis not present

## 2022-07-06 DIAGNOSIS — Z992 Dependence on renal dialysis: Secondary | ICD-10-CM | POA: Diagnosis not present

## 2022-07-06 DIAGNOSIS — N186 End stage renal disease: Secondary | ICD-10-CM | POA: Diagnosis not present

## 2022-07-06 DIAGNOSIS — N2581 Secondary hyperparathyroidism of renal origin: Secondary | ICD-10-CM | POA: Diagnosis not present

## 2022-07-09 DIAGNOSIS — N2581 Secondary hyperparathyroidism of renal origin: Secondary | ICD-10-CM | POA: Diagnosis not present

## 2022-07-09 DIAGNOSIS — Z992 Dependence on renal dialysis: Secondary | ICD-10-CM | POA: Diagnosis not present

## 2022-07-09 DIAGNOSIS — N186 End stage renal disease: Secondary | ICD-10-CM | POA: Diagnosis not present

## 2022-07-10 ENCOUNTER — Ambulatory Visit (HOSPITAL_COMMUNITY): Payer: Self-pay | Admitting: Pharmacist

## 2022-07-10 ENCOUNTER — Ambulatory Visit (INDEPENDENT_AMBULATORY_CARE_PROVIDER_SITE_OTHER): Payer: Medicare HMO

## 2022-07-10 DIAGNOSIS — I255 Ischemic cardiomyopathy: Secondary | ICD-10-CM

## 2022-07-10 LAB — CUP PACEART REMOTE DEVICE CHECK
Battery Remaining Longevity: 125 mo
Battery Voltage: 3.02 V
Brady Statistic RV Percent Paced: 0.01 %
Date Time Interrogation Session: 20231205081842
HighPow Impedance: 34 Ohm
HighPow Impedance: 40 Ohm
Implantable Lead Connection Status: 753985
Implantable Lead Implant Date: 20130603
Implantable Lead Location: 753860
Implantable Lead Model: 6947
Implantable Pulse Generator Implant Date: 20211129
Lead Channel Impedance Value: 399 Ohm
Lead Channel Impedance Value: 665 Ohm
Lead Channel Pacing Threshold Amplitude: 0.875 V
Lead Channel Pacing Threshold Pulse Width: 0.4 ms
Lead Channel Sensing Intrinsic Amplitude: 5.25 mV
Lead Channel Sensing Intrinsic Amplitude: 5.25 mV
Lead Channel Setting Pacing Amplitude: 2.75 V
Lead Channel Setting Pacing Pulse Width: 0.4 ms
Lead Channel Setting Sensing Sensitivity: 0.3 mV
Zone Setting Status: 755011
Zone Setting Status: 755011

## 2022-07-10 LAB — POCT INR: INR: 2 (ref 2.0–3.0)

## 2022-07-11 DIAGNOSIS — N2581 Secondary hyperparathyroidism of renal origin: Secondary | ICD-10-CM | POA: Diagnosis not present

## 2022-07-11 DIAGNOSIS — N186 End stage renal disease: Secondary | ICD-10-CM | POA: Diagnosis not present

## 2022-07-11 DIAGNOSIS — Z992 Dependence on renal dialysis: Secondary | ICD-10-CM | POA: Diagnosis not present

## 2022-07-12 DIAGNOSIS — N2581 Secondary hyperparathyroidism of renal origin: Secondary | ICD-10-CM | POA: Diagnosis not present

## 2022-07-12 DIAGNOSIS — Z992 Dependence on renal dialysis: Secondary | ICD-10-CM | POA: Diagnosis not present

## 2022-07-12 DIAGNOSIS — N186 End stage renal disease: Secondary | ICD-10-CM | POA: Diagnosis not present

## 2022-07-16 DIAGNOSIS — N186 End stage renal disease: Secondary | ICD-10-CM | POA: Diagnosis not present

## 2022-07-16 DIAGNOSIS — Z992 Dependence on renal dialysis: Secondary | ICD-10-CM | POA: Diagnosis not present

## 2022-07-16 DIAGNOSIS — N2581 Secondary hyperparathyroidism of renal origin: Secondary | ICD-10-CM | POA: Diagnosis not present

## 2022-07-17 ENCOUNTER — Ambulatory Visit (HOSPITAL_COMMUNITY): Payer: Self-pay | Admitting: Pharmacist

## 2022-07-17 DIAGNOSIS — Z7901 Long term (current) use of anticoagulants: Secondary | ICD-10-CM | POA: Diagnosis not present

## 2022-07-17 LAB — POCT INR: INR: 1.5 — AB (ref 2.0–3.0)

## 2022-07-17 NOTE — Progress Notes (Signed)
2 days of bleeding so held warfarin x2 days

## 2022-07-18 DIAGNOSIS — Z992 Dependence on renal dialysis: Secondary | ICD-10-CM | POA: Diagnosis not present

## 2022-07-18 DIAGNOSIS — N186 End stage renal disease: Secondary | ICD-10-CM | POA: Diagnosis not present

## 2022-07-18 DIAGNOSIS — N2581 Secondary hyperparathyroidism of renal origin: Secondary | ICD-10-CM | POA: Diagnosis not present

## 2022-07-20 DIAGNOSIS — Z992 Dependence on renal dialysis: Secondary | ICD-10-CM | POA: Diagnosis not present

## 2022-07-20 DIAGNOSIS — N186 End stage renal disease: Secondary | ICD-10-CM | POA: Diagnosis not present

## 2022-07-20 DIAGNOSIS — N2581 Secondary hyperparathyroidism of renal origin: Secondary | ICD-10-CM | POA: Diagnosis not present

## 2022-07-23 DIAGNOSIS — N186 End stage renal disease: Secondary | ICD-10-CM | POA: Diagnosis not present

## 2022-07-23 DIAGNOSIS — Z992 Dependence on renal dialysis: Secondary | ICD-10-CM | POA: Diagnosis not present

## 2022-07-23 DIAGNOSIS — N2581 Secondary hyperparathyroidism of renal origin: Secondary | ICD-10-CM | POA: Diagnosis not present

## 2022-07-23 IMAGING — CT CT ABD-PELV W/O CM
2 of 4 series · 15 of 46 positions shown, 17 images · non-contrast
Comparison: CT 01/11/2018.

CLINICAL DATA: Preoperative evaluation prior to planned ventricular
assist device placement.

EXAM:
CT CHEST, ABDOMEN AND PELVIS WITHOUT CONTRAST
TECHNIQUE: Multidetector CT imaging of the chest, abdomen and pelvis was
performed following the standard protocol without IV contrast.

[Series 3: cap without · axial · non-contrast · 0.86mm/px · z∈[+713,+1268]mm · 12 of 133 slices shown, 14 images]
[im 11/133  soft-tissue]
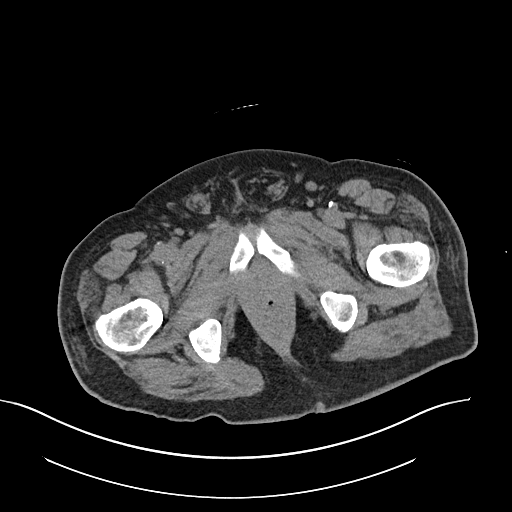
[im 11/133  bone]
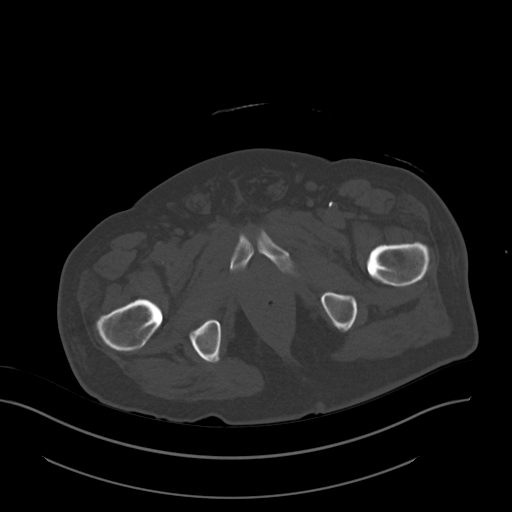
[im 21/133  soft-tissue]
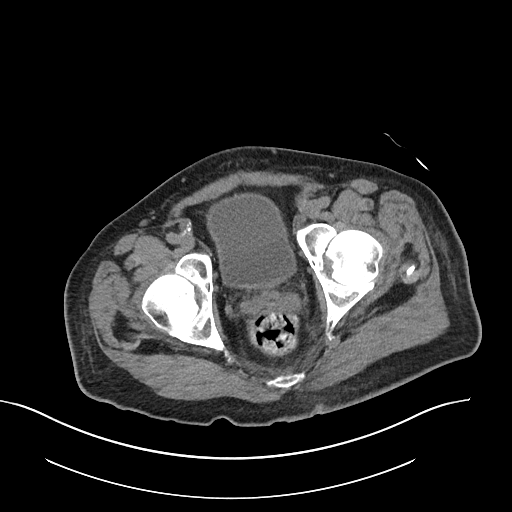
[im 31/133  soft-tissue]
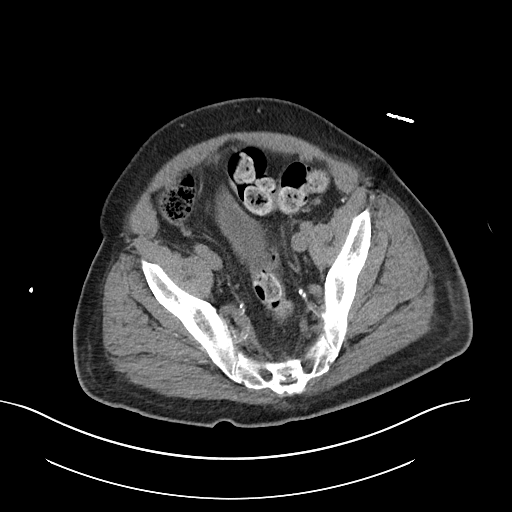
[im 41/133  soft-tissue]
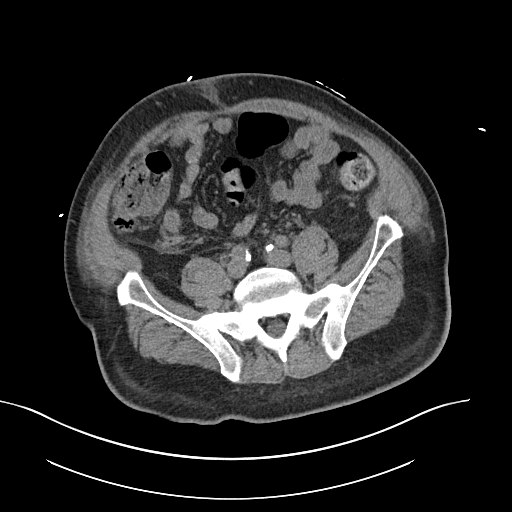
[im 51/133  soft-tissue]
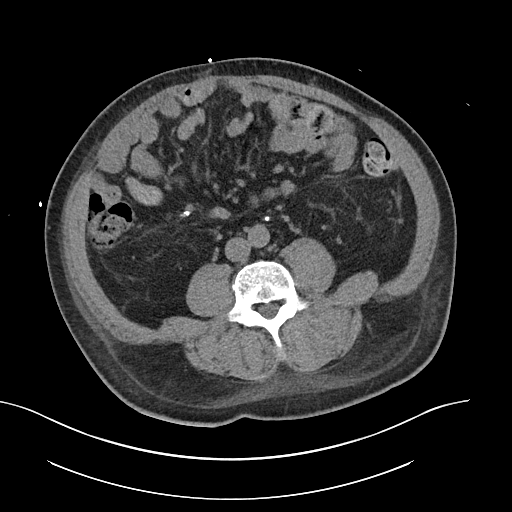
[im 61/133  soft-tissue]
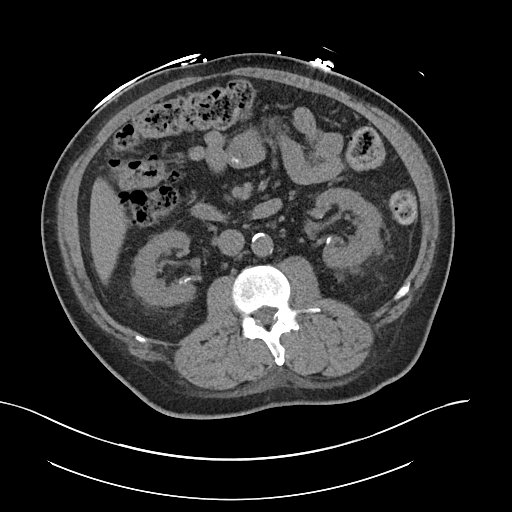
[im 72/133  soft-tissue]
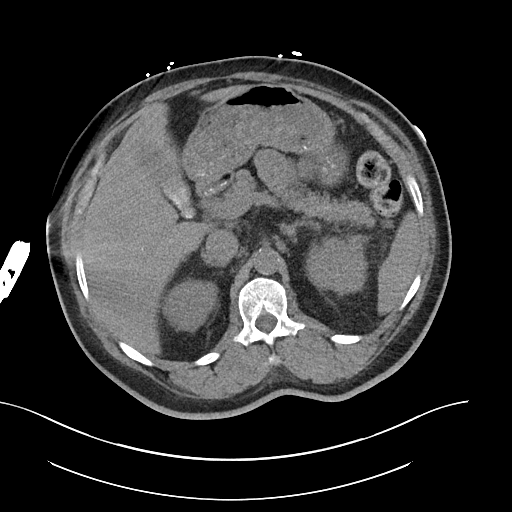
[im 82/133  soft-tissue]
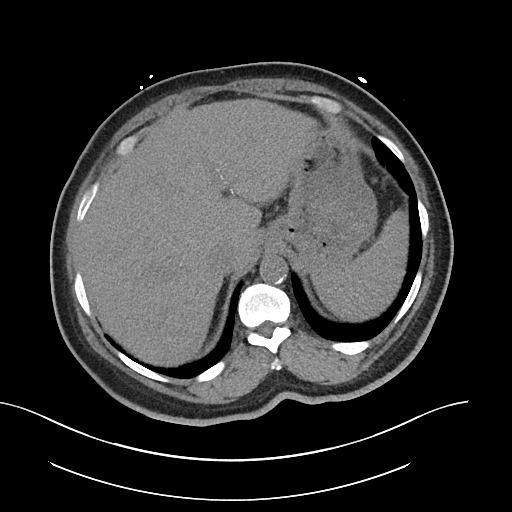
[im 92/133  soft-tissue]
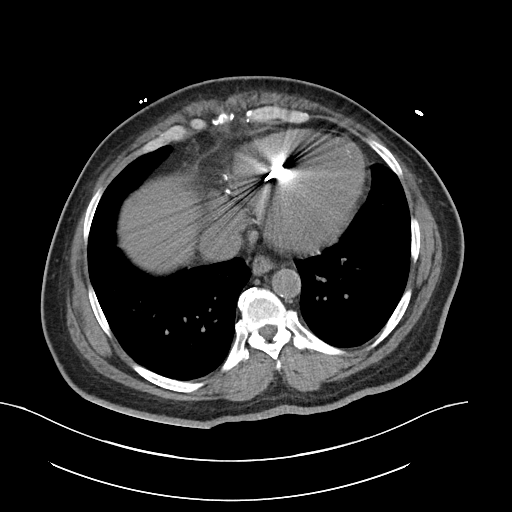
[im 92/133  bone]
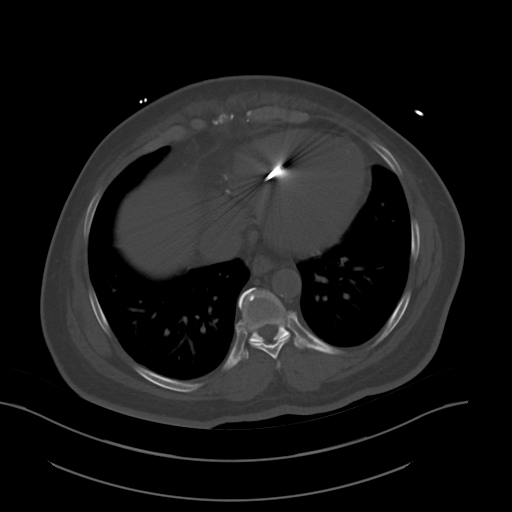
[im 102/133  soft-tissue]
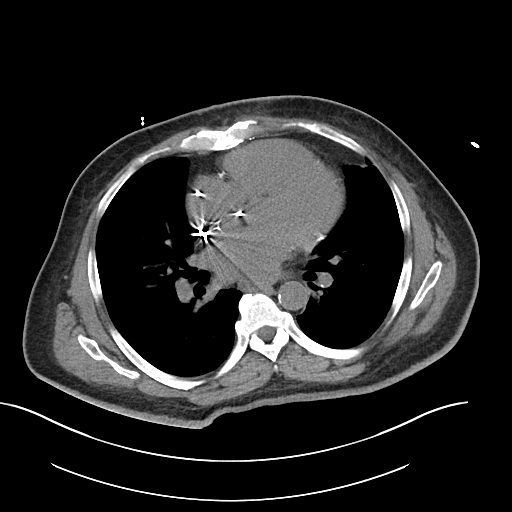
[im 112/133  soft-tissue]
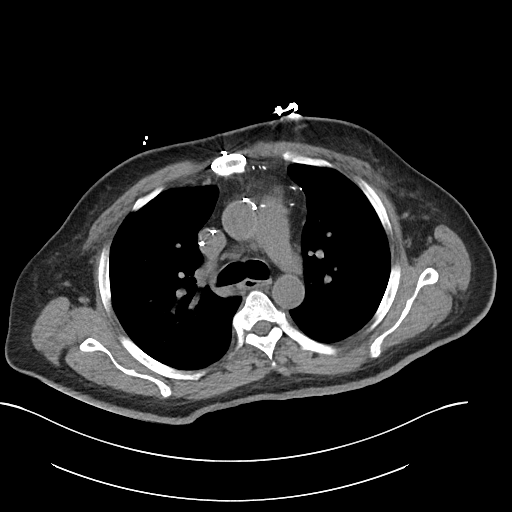
[im 122/133  soft-tissue]
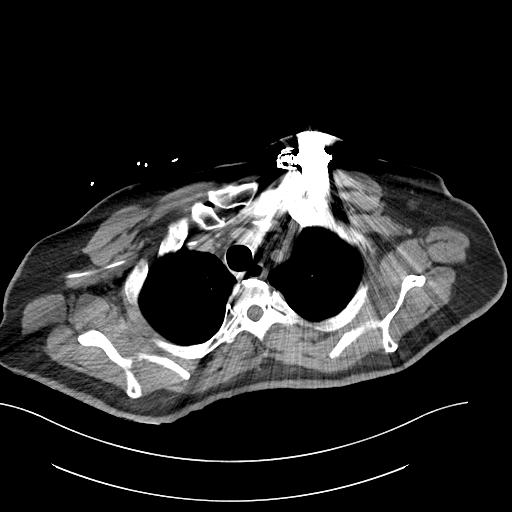

[Series 6: cor · coronal · 0.88mm/px · 3 of 116 slices shown]
[im 39/116  soft-tissue]
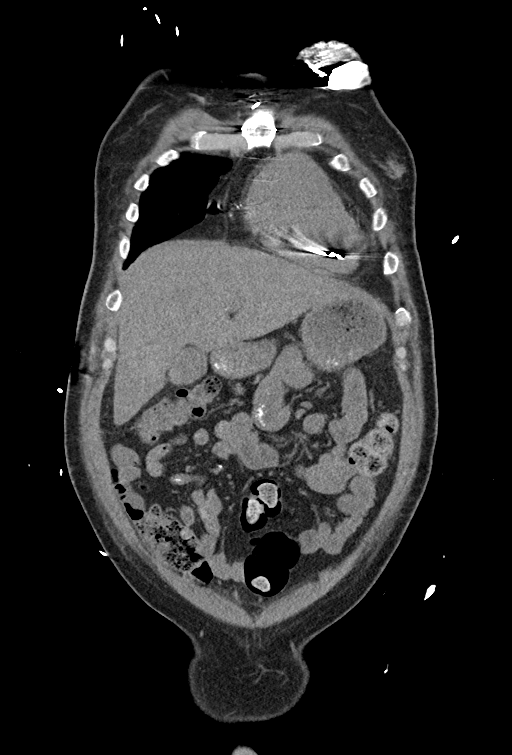
[im 52/116  soft-tissue]
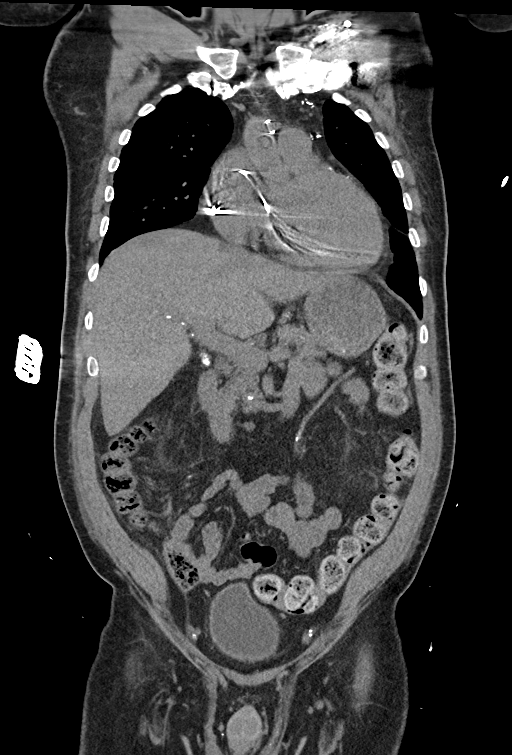
[im 64/116  soft-tissue]
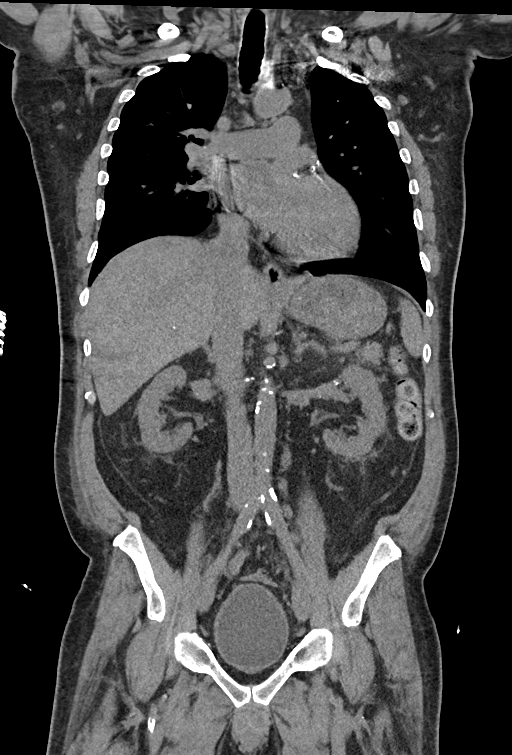

[15 of 46 positions shown; findings below may reference images not displayed]

FINDINGS: CT CHEST FINDINGS

Cardiovascular: Diffuse three-vessel coronary artery atherosclerosis
with lesser involvement of the aorta and great vessel status post
median sternotomy and CABG. Right arm PICC projects to the superior
cavoatrial junction. Left subclavian pacemaker leads appear
unchanged. There is stable mild cardiomegaly. No significant
pericardial fluid.

Mediastinum/Nodes: There are no enlarged mediastinal, hilar or
axillary lymph nodes. Small mediastinal lymph nodes are stable.
Hilar assessment is limited by the lack of intravenous contrast,
although the hilar contours appear unchanged. The thyroid gland,
trachea and esophagus demonstrate no significant findings.

Lungs/Pleura: No pleural effusion or pneumothorax. The lungs are
clear. There are probable mild centrilobular emphysematous changes.

Musculoskeletal/Chest wall: No chest wall mass or suspicious osseous
findings. Mild asymmetric left-sided gynecomastia.

CT ABDOMEN AND PELVIS FINDINGS

Hepatobiliary: The liver is normal in density without suspicious
focal abnormality. Dependent small gallstones or high density bile
in the gallbladder, similar to previous study. No gallbladder wall
thickening, surrounding inflammation or biliary dilatation.

Pancreas: Unremarkable. No pancreatic ductal dilatation or
surrounding inflammatory changes.

Spleen: Normal in size without focal abnormality.

Adrenals/Urinary Tract: Both adrenal glands appear normal. Renal
vascular calcifications and symmetric perinephric soft tissue
stranding bilaterally are stable. No evidence of urinary tract
calculus or hydronephrosis. The bladder appears unremarkable.

Stomach/Bowel: The stomach appears unremarkable for its degree of
distension. No evidence of bowel wall thickening, distention or
surrounding inflammatory change. The appendix appears normal.

Vascular/Lymphatic: There are no enlarged abdominal or pelvic lymph
nodes. Stable small lymph nodes in the porta hepatis. Diffuse aortic
and branch vessel atherosclerosis without acute vascular findings on
noncontrast imaging.

Reproductive: Stable mild enlargement of the prostate gland.

Other: No evidence of abdominal wall mass or hernia. No ascites.

Musculoskeletal: No acute or significant osseous findings.
IMPRESSION: 1. No acute findings or significant changes from previous study.
2. Cholelithiasis without evidence of cholecystitis or biliary
dilatation.
3. Cardiomegaly post CABG.  Aortic Atherosclerosis ([id number]).

## 2022-07-23 IMAGING — DX DG ORTHOPANTOGRAM /PANORAMIC
1 series · 1 of 1 positions shown · non-contrast
Comparison: 01/08/2018

CLINICAL DATA: Pre LVAD evaluation to rule out surgical
contraindication.

EXAM:
ORTHOPANTOGRAM/PANORAMIC

[view not recorded]
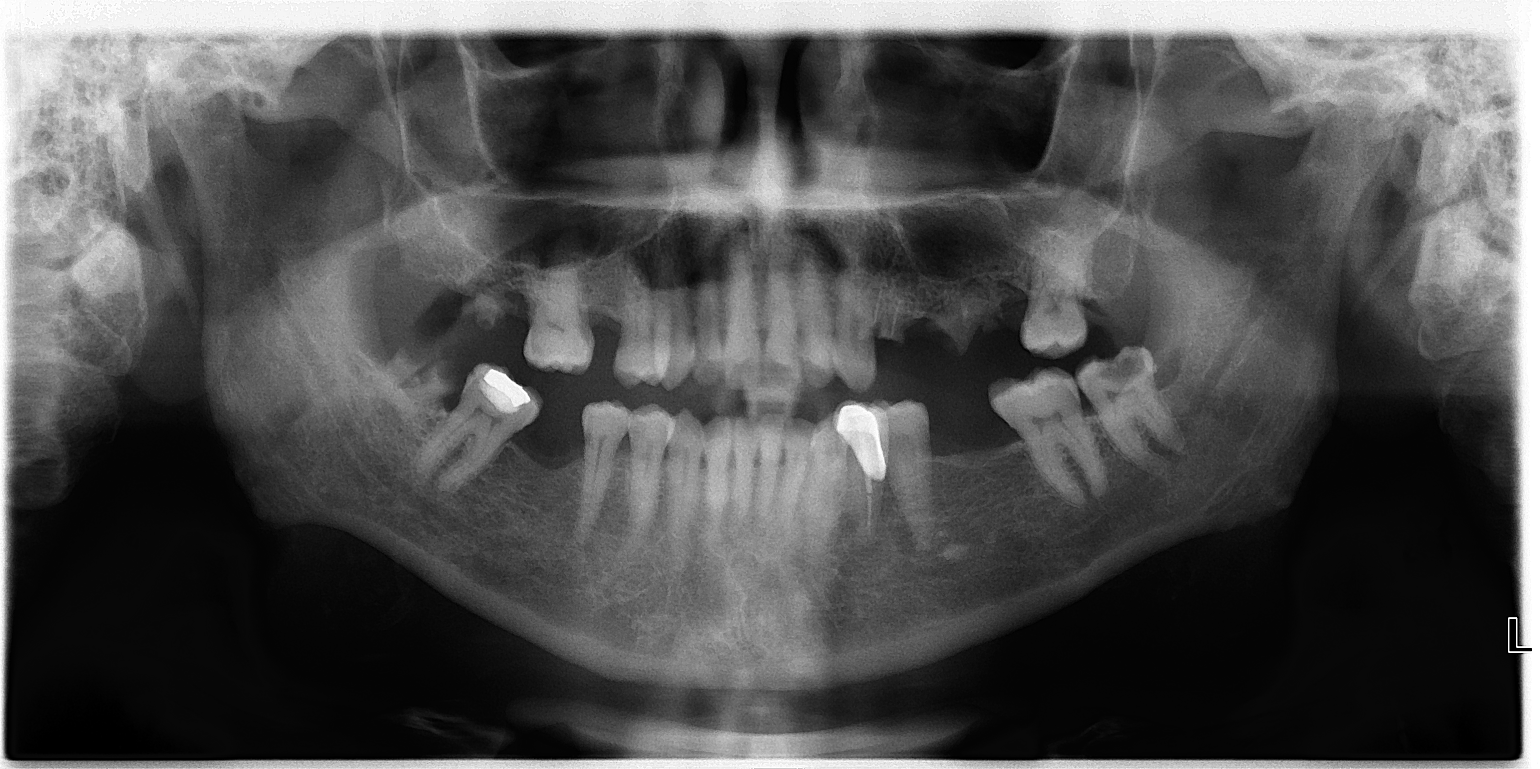

[1 of 1 positions shown; findings below may reference images not displayed]

FINDINGS: There are extensive caries, with caries destruction of numerous
teeth in the maxilla and mandible. No evidence for periapical
abscess or sinus fluid.
IMPRESSION: Multiple caries, with destruction of numerous teeth in the maxilla
and mandible.

## 2022-07-24 ENCOUNTER — Ambulatory Visit (HOSPITAL_COMMUNITY): Payer: Self-pay | Admitting: Pharmacist

## 2022-07-24 ENCOUNTER — Ambulatory Visit (INDEPENDENT_AMBULATORY_CARE_PROVIDER_SITE_OTHER): Payer: Medicare HMO

## 2022-07-24 ENCOUNTER — Ambulatory Visit: Payer: Medicare HMO

## 2022-07-24 VITALS — Ht 70.5 in | Wt 184.0 lb

## 2022-07-24 DIAGNOSIS — Z0000 Encounter for general adult medical examination without abnormal findings: Secondary | ICD-10-CM | POA: Diagnosis not present

## 2022-07-24 LAB — POCT INR: INR: 1.9 — AB (ref 2.0–3.0)

## 2022-07-24 NOTE — Patient Instructions (Addendum)
Mr. Roarty , Thank you for taking time to come for your Medicare Wellness Visit. I appreciate your ongoing commitment to your health goals. Please review the following plan we discussed and let me know if I can assist you in the future.   These are the goals we discussed:  Goals       Live everyday (pt-stated)      Patient Stated      07/18/2021, live as best he can        This is a list of the screening recommended for you and due dates:  Health Maintenance  Topic Date Due   DTaP/Tdap/Td vaccine (1 - Tdap) Never done   Eye exam for diabetics  07/12/2021   COVID-19 Vaccine (4 - 2023-24 season) 08/09/2022*   Zoster (Shingles) Vaccine (1 of 2) 10/23/2022*   Colon Cancer Screening  07/25/2023*   Hemoglobin A1C  09/27/2022   Complete foot exam   06/28/2023   Medicare Annual Wellness Visit  07/25/2023   Flu Shot  Completed   Hepatitis C Screening: USPSTF Recommendation to screen - Ages 18-79 yo.  Completed   HIV Screening  Completed   HPV Vaccine  Aged Out  *Topic was postponed. The date shown is not the original due date.   Opioid Pain Medicine Management Opioids are powerful medicines that are used to treat moderate to severe pain. When used for short periods of time, they can help you to: Sleep better. Do better in physical or occupational therapy. Feel better in the first few days after an injury. Recover from surgery. Opioids should be taken with the supervision of a trained health care provider. They should be taken for the shortest period of time possible. This is because opioids can be addictive, and the longer you take opioids, the greater your risk of addiction. This addiction can also be called opioid use disorder. What are the risks? Using opioid pain medicines for longer than 3 days increases your risk of side effects. Side effects include: Constipation. Nausea and vomiting. Breathing difficulties (respiratory depression). Drowsiness. Confusion. Opioid use  disorder. Itching. Taking opioid pain medicine for a long period of time can affect your ability to do daily tasks. It also puts you at risk for: Motor vehicle crashes. Depression. Suicide. Heart attack. Overdose, which can be life-threatening. What is a pain treatment plan? A pain treatment plan is an agreement between you and your health care provider. Pain is unique to each person, and treatments vary depending on your condition. To manage your pain, you and your health care provider need to work together. To help you do this: Discuss the goals of your treatment, including how much pain you might expect to have and how you will manage the pain. Review the risks and benefits of taking opioid medicines. Remember that a good treatment plan uses more than one approach and minimizes the chance of side effects. Be honest about the amount of medicines you take and about any drug or alcohol use. Get pain medicine prescriptions from only one health care provider. Pain can be managed with many types of alternative treatments. Ask your health care provider to refer you to one or more specialists who can help you manage pain through: Physical or occupational therapy. Counseling (cognitive behavioral therapy). Good nutrition. Biofeedback. Massage. Meditation. Non-opioid medicine. Following a gentle exercise program. How to use opioid pain medicine Taking medicine Take your pain medicine exactly as told by your health care provider. Take it only when you need  it. If your pain gets less severe, you may take less than your prescribed dose if your health care provider approves. If you are not having pain, do nottake pain medicine unless your health care provider tells you to take it. If your pain is severe, do nottry to treat it yourself by taking more pills than instructed on your prescription. Contact your health care provider for help. Write down the times when you take your pain medicine. It is  easy to become confused while on pain medicine. Writing the time can help you avoid overdose. Take other over-the-counter or prescription medicines only as told by your health care provider. Keeping yourself and others safe  While you are taking opioid pain medicine: Do not drive, use machinery, or power tools. Do not sign legal documents. Do not drink alcohol. Do not take sleeping pills. Do not supervise children by yourself. Do not do activities that require climbing or being in high places. Do not go to a lake, river, ocean, spa, or swimming pool. Do not share your pain medicine with anyone. Keep pain medicine in a locked cabinet or in a secure area where pets and children cannot reach it. Stopping your use of opioids If you have been taking opioid medicine for more than a few weeks, you may need to slowly decrease (taper) how much you take until you stop completely. Tapering your use of opioids can decrease your risk of symptoms of withdrawal, such as: Pain and cramping in the abdomen. Nausea. Sweating. Sleepiness. Restlessness. Uncontrollable shaking (tremors). Cravings for the medicine. Do not attempt to taper your use of opioids on your own. Talk with your health care provider about how to do this. Your health care provider may prescribe a step-down schedule based on how much medicine you are taking and how long you have been taking it. Getting rid of leftover pills Do not save any leftover pills. Get rid of leftover pills safely by: Taking the medicine to a prescription take-back program. This is usually offered by the county or law enforcement. Bringing them to a pharmacy that has a drug disposal container. Flushing them down the toilet. Check the label or package insert of your medicine to see whether this is safe to do. Throwing them out in the trash. Check the label or package insert of your medicine to see whether this is safe to do. If it is safe to throw it out, remove  the medicine from the original container, put it into a sealable bag or container, and mix it with used coffee grounds, food scraps, dirt, or cat litter before putting it in the trash. Follow these instructions at home: Activity Do exercises as told by your health care provider. Avoid activities that make your pain worse. Return to your normal activities as told by your health care provider. Ask your health care provider what activities are safe for you. General instructions You may need to take these actions to prevent or treat constipation: Drink enough fluid to keep your urine pale yellow. Take over-the-counter or prescription medicines. Eat foods that are high in fiber, such as beans, whole grains, and fresh fruits and vegetables. Limit foods that are high in fat and processed sugars, such as fried or sweet foods. Keep all follow-up visits. This is important. Where to find support If you have been taking opioids for a long time, you may benefit from receiving support for quitting from a local support group or counselor. Ask your health care provider for a  referral to these resources in your area. Where to find more information Centers for Disease Control and Prevention (CDC): http://www.wolf.info/ [place] Food and Drug Administration (FDA): [employer] Get help right away if: You may have taken too much of an opioid (overdosed). Common symptoms of an overdose: Your breathing is slower or more shallow than normal. You have a very slow heartbeat (pulse). You have slurred speech. You have nausea and vomiting. Your pupils become very small. You have other potential symptoms: You are very confused. You faint or feel like you will faint. You have cold, clammy skin. You have blue lips or fingernails. You have thoughts of harming yourself or harming others. These symptoms may represent a serious problem that is an emergency. Do not wait to see if the symptoms will go away. Get medical help right away.  Call your local emergency services (911 in the [place] Do not drive yourself to the hospital.  If you ever feel like you may hurt yourself or others, or have thoughts about taking your own life, get help right away. Go to your nearest emergency department or: Call your local emergency services (911 in the [place] Call the [facility] 901-399-7340 in the [place] Call a suicide crisis helpline, such as the [place] at 905-385-8232 or 988 in the [place]. This is open 24 hours a day in the [place] Text the Crisis Text Line at 540-096-3687 (in the [place].). Summary Opioid medicines can help you manage moderate to severe pain for a short period of time. A pain treatment plan is an agreement between you and your health care provider. Discuss the goals of your treatment, including how much pain you might expect to have and how you will manage the pain. If you think that you or someone else may have taken too much of an opioid, get medical help right away. This information is not intended to replace advice given to you by your health care provider. Make sure you discuss any questions you have with your health care provider. Document Revised: 02/15/2021 Document Reviewed: 11/02/2020 Elsevier Patient Education  [place] directives: In Chart  Conditions/risks identified: None  Next appointment: Follow up in one year for your annual wellness visit    Preventive Care 40-64 Years, Male Preventive care refers to lifestyle choices and visits with your health care provider that can promote health and wellness. What does preventive care include? A yearly physical exam. This is also called an annual well check. Dental exams once or twice a year. Routine eye exams. Ask your health care provider how often you should have your eyes checked. Personal lifestyle choices, including: Daily care of your teeth and gums. Regular physical activity. Eating a healthy  diet. Avoiding tobacco and drug use. Limiting alcohol use. Practicing safe sex. Taking low-dose aspirin every day starting at age 47. What happens during an annual well check? The services and screenings done by your health care provider during your annual well check will depend on your age, overall health, lifestyle risk factors, and family history of disease. Counseling  Your health care provider may ask you questions about your: Alcohol use. Tobacco use. Drug use. Emotional well-being. Home and relationship well-being. Sexual activity. Eating habits. Work and work Statistician. Screening  You may have the following tests or measurements: Height, weight, and BMI. Blood pressure. Lipid and cholesterol levels. These may be checked every 5 years, or more frequently if you are over 62 years old. Skin check. Lung  cancer screening. You may have this screening every year starting at age 35 if you have a 30-pack-year history of smoking and currently smoke or have quit within the past 15 years. Fecal occult blood test (FOBT) of the stool. You may have this test every year starting at age 34. Flexible sigmoidoscopy or colonoscopy. You may have a sigmoidoscopy every 5 years or a colonoscopy every 10 years starting at age 34. Prostate cancer screening. Recommendations will vary depending on your family history and other risks. Hepatitis C blood test. Hepatitis B blood test. Sexually transmitted disease (STD) testing. Diabetes screening. This is done by checking your blood sugar (glucose) after you have not eaten for a while (fasting). You may have this done every 1-3 years. Discuss your test results, treatment options, and if necessary, the need for more tests with your health care provider. Vaccines  Your health care provider may recommend certain vaccines, such as: Influenza vaccine. This is recommended every year. Tetanus, diphtheria, and acellular pertussis (Tdap, Td) vaccine. You may  need a Td booster every 10 years. Zoster vaccine. You may need this after age 53. Pneumococcal 13-valent conjugate (PCV13) vaccine. You may need this if you have certain conditions and have not been vaccinated. Pneumococcal polysaccharide (PPSV23) vaccine. You may need one or two doses if you smoke cigarettes or if you have certain conditions. Talk to your health care provider about which screenings and vaccines you need and how often you need them. This information is not intended to replace advice given to you by your health care provider. Make sure you discuss any questions you have with your health care provider. Document Released: 08/19/2015 Document Revised: 04/11/2016 Document Reviewed: 05/24/2015 Elsevier Interactive Patient Education  2017 [place] Prevention in the Home Falls can cause injuries. They can happen to people of all ages. There are many things you can do to make your home safe and to help prevent falls. What can I do on the outside of my home? Regularly fix the edges of walkways and driveways and fix any cracks. Remove anything that might make you trip as you walk through a door, such as a raised step or threshold. Trim any bushes or trees on the path to your home. Use bright outdoor lighting. Clear any walking paths of anything that might make someone trip, such as rocks or tools. Regularly check to see if handrails are loose or broken. Make sure that both sides of any steps have handrails. Any raised decks and porches should have guardrails on the edges. Have any leaves, snow, or ice cleared regularly. Use sand or salt on walking paths during winter. Clean up any spills in your garage right away. This includes oil or grease spills. What can I do in the bathroom? Use night lights. Install grab bars by the toilet and in the tub and shower. Do not use towel bars as grab bars. Use non-skid mats or decals in the tub or shower. If you need to sit down in the  shower, use a plastic, non-slip stool. Keep the floor dry. Clean up any water that spills on the floor as soon as it happens. Remove soap buildup in the tub or shower regularly. Attach bath mats securely with double-sided non-slip rug tape. Do not have throw rugs and other things on the floor that can make you trip. What can I do in the bedroom? Use night lights. Make sure that you have a light by your bed that is easy to  reach. Do not use any sheets or blankets that are too big for your bed. They should not hang down onto the floor. Have a firm chair that has side arms. You can use this for support while you get dressed. Do not have throw rugs and other things on the floor that can make you trip. What can I do in the kitchen? Clean up any spills right away. Avoid walking on wet floors. Keep items that you use a lot in easy-to-reach places. If you need to reach something above you, use a strong step stool that has a grab bar. Keep electrical cords out of the way. Do not use floor polish or wax that makes floors slippery. If you must use wax, use non-skid floor wax. Do not have throw rugs and other things on the floor that can make you trip. What can I do with my stairs? Do not leave any items on the stairs. Make sure that there are handrails on both sides of the stairs and use them. Fix handrails that are broken or loose. Make sure that handrails are as long as the stairways. Check any carpeting to make sure that it is firmly attached to the stairs. Fix any carpet that is loose or worn. Avoid having throw rugs at the top or bottom of the stairs. If you do have throw rugs, attach them to the floor with carpet tape. Make sure that you have a light switch at the top of the stairs and the bottom of the stairs. If you do not have them, ask someone to add them for you. What else can I do to help prevent falls? Wear shoes that: Do not have high heels. Have rubber bottoms. Are comfortable and  fit you well. Are closed at the toe. Do not wear sandals. If you use a stepladder: Make sure that it is fully opened. Do not climb a closed stepladder. Make sure that both sides of the stepladder are locked into place. Ask someone to hold it for you, if possible. Clearly mark and make sure that you can see: Any grab bars or handrails. First and last steps. Where the edge of each step is. Use tools that help you move around (mobility aids) if they are needed. These include: Canes. Walkers. Scooters. Crutches. Turn on the lights when you go into a dark area. Replace any light bulbs as soon as they burn out. Set up your furniture so you have a clear path. Avoid moving your furniture around. If any of your floors are uneven, fix them. If there are any pets around you, be aware of where they are. Review your medicines with your doctor. Some medicines can make you feel dizzy. This can increase your chance of falling. Ask your doctor what other things that you can do to help prevent falls. This information is not intended to replace advice given to you by your health care provider. Make sure you discuss any questions you have with your health care provider. Document Released: 05/19/2009 Document Revised: 12/29/2015 Document Reviewed: 08/27/2014 Elsevier Interactive Patient Education  2017 [employer].
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
## 2022-07-25 DIAGNOSIS — N2581 Secondary hyperparathyroidism of renal origin: Secondary | ICD-10-CM | POA: Diagnosis not present

## 2022-07-25 DIAGNOSIS — Z992 Dependence on renal dialysis: Secondary | ICD-10-CM | POA: Diagnosis not present

## 2022-07-25 DIAGNOSIS — N186 End stage renal disease: Secondary | ICD-10-CM | POA: Diagnosis not present

## 2022-07-26 ENCOUNTER — Other Ambulatory Visit: Payer: Self-pay

## 2022-07-26 ENCOUNTER — Telehealth: Payer: Self-pay | Admitting: Family Medicine

## 2022-07-26 DIAGNOSIS — E1122 Type 2 diabetes mellitus with diabetic chronic kidney disease: Secondary | ICD-10-CM

## 2022-07-26 DIAGNOSIS — N186 End stage renal disease: Secondary | ICD-10-CM

## 2022-07-26 MED ORDER — ONETOUCH ULTRA 2 W/DEVICE KIT: 1.0000 | PACK | Freq: Three times a day (TID) | 0 refills | Status: DC

## 2022-07-26 MED ORDER — ONETOUCH ULTRASOFT 2 LANCETS MISC: 1.0000 | Freq: Three times a day (TID) | 1 refills | Status: DC

## 2022-07-26 NOTE — Telephone Encounter (Signed)
Pt needs new one touch ultra 2 glucometer with lancets please send to  [facility] [place], [place] Phone: [phone]  Fax: [phone]

## 2022-07-27 DIAGNOSIS — N2581 Secondary hyperparathyroidism of renal origin: Secondary | ICD-10-CM | POA: Diagnosis not present

## 2022-07-27 DIAGNOSIS — N186 End stage renal disease: Secondary | ICD-10-CM | POA: Diagnosis not present

## 2022-07-27 DIAGNOSIS — Z992 Dependence on renal dialysis: Secondary | ICD-10-CM | POA: Diagnosis not present

## 2022-07-27 MED ORDER — ONETOUCH ULTRASOFT 2 LANCETS MISC: 1.0000 | Freq: Three times a day (TID) | 1 refills | Status: DC

## 2022-07-27 MED ORDER — ONETOUCH ULTRA 2 W/DEVICE KIT: 1.0000 | PACK | Freq: Three times a day (TID) | 0 refills | Status: DC

## 2022-07-27 NOTE — Telephone Encounter (Signed)
Reordered One touch Glucometer with lancets.  [facility] [place].

## 2022-07-29 DIAGNOSIS — Z992 Dependence on renal dialysis: Secondary | ICD-10-CM | POA: Diagnosis not present

## 2022-07-29 DIAGNOSIS — N2581 Secondary hyperparathyroidism of renal origin: Secondary | ICD-10-CM | POA: Diagnosis not present

## 2022-07-29 DIAGNOSIS — N186 End stage renal disease: Secondary | ICD-10-CM | POA: Diagnosis not present

## 2022-07-31 ENCOUNTER — Ambulatory Visit (HOSPITAL_COMMUNITY): Payer: Self-pay | Admitting: Pharmacist

## 2022-07-31 ENCOUNTER — Other Ambulatory Visit: Payer: Self-pay

## 2022-07-31 LAB — POCT INR: INR: 1.9 — AB (ref 2.0–3.0)

## 2022-07-31 MED ORDER — ACCU-CHEK GUIDE VI STRP: ORAL_STRIP | 1 refills | Status: DC

## 2022-08-01 DIAGNOSIS — N186 End stage renal disease: Secondary | ICD-10-CM | POA: Diagnosis not present

## 2022-08-01 DIAGNOSIS — Z992 Dependence on renal dialysis: Secondary | ICD-10-CM | POA: Diagnosis not present

## 2022-08-01 DIAGNOSIS — N2581 Secondary hyperparathyroidism of renal origin: Secondary | ICD-10-CM | POA: Diagnosis not present

## 2022-08-03 DIAGNOSIS — N2581 Secondary hyperparathyroidism of renal origin: Secondary | ICD-10-CM | POA: Diagnosis not present

## 2022-08-03 DIAGNOSIS — N186 End stage renal disease: Secondary | ICD-10-CM | POA: Diagnosis not present

## 2022-08-03 DIAGNOSIS — Z992 Dependence on renal dialysis: Secondary | ICD-10-CM | POA: Diagnosis not present

## 2022-08-05 DIAGNOSIS — N186 End stage renal disease: Secondary | ICD-10-CM | POA: Diagnosis not present

## 2022-08-05 DIAGNOSIS — N2581 Secondary hyperparathyroidism of renal origin: Secondary | ICD-10-CM | POA: Diagnosis not present

## 2022-08-05 DIAGNOSIS — I509 Heart failure, unspecified: Secondary | ICD-10-CM | POA: Diagnosis not present

## 2022-08-05 DIAGNOSIS — Z992 Dependence on renal dialysis: Secondary | ICD-10-CM | POA: Diagnosis not present

## 2022-08-07 ENCOUNTER — Ambulatory Visit (HOSPITAL_COMMUNITY): Payer: Self-pay | Admitting: Pharmacist

## 2022-08-07 LAB — POCT INR: INR: 2 (ref 2.0–3.0)

## 2022-08-08 DIAGNOSIS — N186 End stage renal disease: Secondary | ICD-10-CM | POA: Diagnosis not present

## 2022-08-08 DIAGNOSIS — Z992 Dependence on renal dialysis: Secondary | ICD-10-CM | POA: Diagnosis not present

## 2022-08-08 DIAGNOSIS — N2581 Secondary hyperparathyroidism of renal origin: Secondary | ICD-10-CM | POA: Diagnosis not present

## 2022-08-08 NOTE — Progress Notes (Signed)
Remote ICD transmission.   

## 2022-08-10 DIAGNOSIS — Z992 Dependence on renal dialysis: Secondary | ICD-10-CM | POA: Diagnosis not present

## 2022-08-10 DIAGNOSIS — N2581 Secondary hyperparathyroidism of renal origin: Secondary | ICD-10-CM | POA: Diagnosis not present

## 2022-08-10 DIAGNOSIS — N186 End stage renal disease: Secondary | ICD-10-CM | POA: Diagnosis not present

## 2022-08-13 DIAGNOSIS — N186 End stage renal disease: Secondary | ICD-10-CM | POA: Diagnosis not present

## 2022-08-13 DIAGNOSIS — N2581 Secondary hyperparathyroidism of renal origin: Secondary | ICD-10-CM | POA: Diagnosis not present

## 2022-08-13 DIAGNOSIS — Z992 Dependence on renal dialysis: Secondary | ICD-10-CM | POA: Diagnosis not present

## 2022-08-14 ENCOUNTER — Ambulatory Visit (HOSPITAL_COMMUNITY): Payer: Self-pay | Admitting: Pharmacist

## 2022-08-14 DIAGNOSIS — Z7901 Long term (current) use of anticoagulants: Secondary | ICD-10-CM | POA: Diagnosis not present

## 2022-08-14 LAB — POCT INR: INR: 2 (ref 2.0–3.0)

## 2022-08-15 DIAGNOSIS — N186 End stage renal disease: Secondary | ICD-10-CM | POA: Diagnosis not present

## 2022-08-15 DIAGNOSIS — Z992 Dependence on renal dialysis: Secondary | ICD-10-CM | POA: Diagnosis not present

## 2022-08-15 DIAGNOSIS — N2581 Secondary hyperparathyroidism of renal origin: Secondary | ICD-10-CM | POA: Diagnosis not present

## 2022-08-17 DIAGNOSIS — N2581 Secondary hyperparathyroidism of renal origin: Secondary | ICD-10-CM | POA: Diagnosis not present

## 2022-08-17 DIAGNOSIS — N186 End stage renal disease: Secondary | ICD-10-CM | POA: Diagnosis not present

## 2022-08-17 DIAGNOSIS — Z992 Dependence on renal dialysis: Secondary | ICD-10-CM | POA: Diagnosis not present

## 2022-08-20 DIAGNOSIS — N2581 Secondary hyperparathyroidism of renal origin: Secondary | ICD-10-CM | POA: Diagnosis not present

## 2022-08-20 DIAGNOSIS — N186 End stage renal disease: Secondary | ICD-10-CM | POA: Diagnosis not present

## 2022-08-20 DIAGNOSIS — Z992 Dependence on renal dialysis: Secondary | ICD-10-CM | POA: Diagnosis not present

## 2022-08-21 ENCOUNTER — Ambulatory Visit (HOSPITAL_COMMUNITY)
Admission: RE | Admit: 2022-08-21 | Discharge: 2022-08-21 | Disposition: A | Discharge status: Home / Self Care | Payer: Medicare HMO | Source: Ambulatory Visit | Attending: Cardiology | Admitting: Cardiology

## 2022-08-21 ENCOUNTER — Ambulatory Visit (HOSPITAL_COMMUNITY): Payer: Self-pay | Admitting: Pharmacist

## 2022-08-21 ENCOUNTER — Encounter (HOSPITAL_COMMUNITY): Payer: Self-pay | Admitting: Cardiology

## 2022-08-21 ENCOUNTER — Encounter (HOSPITAL_COMMUNITY): Payer: Medicare HMO

## 2022-08-21 DIAGNOSIS — G629 Polyneuropathy, unspecified: Secondary | ICD-10-CM | POA: Diagnosis not present

## 2022-08-21 DIAGNOSIS — N186 End stage renal disease: Secondary | ICD-10-CM | POA: Diagnosis not present

## 2022-08-21 DIAGNOSIS — B9561 Methicillin susceptible Staphylococcus aureus infection as the cause of diseases classified elsewhere: Secondary | ICD-10-CM | POA: Diagnosis not present

## 2022-08-21 DIAGNOSIS — E1142 Type 2 diabetes mellitus with diabetic polyneuropathy: Secondary | ICD-10-CM | POA: Insufficient documentation

## 2022-08-21 DIAGNOSIS — E11319 Type 2 diabetes mellitus with unspecified diabetic retinopathy without macular edema: Secondary | ICD-10-CM | POA: Insufficient documentation

## 2022-08-21 DIAGNOSIS — I252 Old myocardial infarction: Secondary | ICD-10-CM | POA: Insufficient documentation

## 2022-08-21 DIAGNOSIS — I5042 Chronic combined systolic (congestive) and diastolic (congestive) heart failure: Secondary | ICD-10-CM

## 2022-08-21 DIAGNOSIS — Z7901 Long term (current) use of anticoagulants: Secondary | ICD-10-CM | POA: Diagnosis not present

## 2022-08-21 DIAGNOSIS — Z7984 Long term (current) use of oral hypoglycemic drugs: Secondary | ICD-10-CM | POA: Diagnosis not present

## 2022-08-21 DIAGNOSIS — I255 Ischemic cardiomyopathy: Secondary | ICD-10-CM | POA: Insufficient documentation

## 2022-08-21 DIAGNOSIS — M79641 Pain in right hand: Secondary | ICD-10-CM | POA: Insufficient documentation

## 2022-08-21 DIAGNOSIS — I2510 Atherosclerotic heart disease of native coronary artery without angina pectoris: Secondary | ICD-10-CM | POA: Diagnosis not present

## 2022-08-21 DIAGNOSIS — D649 Anemia, unspecified: Secondary | ICD-10-CM | POA: Insufficient documentation

## 2022-08-21 DIAGNOSIS — E1136 Type 2 diabetes mellitus with diabetic cataract: Secondary | ICD-10-CM | POA: Diagnosis not present

## 2022-08-21 DIAGNOSIS — Z8616 Personal history of COVID-19: Secondary | ICD-10-CM | POA: Diagnosis not present

## 2022-08-21 DIAGNOSIS — N1832 Chronic kidney disease, stage 3b: Secondary | ICD-10-CM

## 2022-08-21 DIAGNOSIS — I4891 Unspecified atrial fibrillation: Secondary | ICD-10-CM | POA: Diagnosis not present

## 2022-08-21 DIAGNOSIS — Z95810 Presence of automatic (implantable) cardiac defibrillator: Secondary | ICD-10-CM | POA: Insufficient documentation

## 2022-08-21 DIAGNOSIS — Z79899 Other long term (current) drug therapy: Secondary | ICD-10-CM | POA: Insufficient documentation

## 2022-08-21 DIAGNOSIS — I132 Hypertensive heart and chronic kidney disease with heart failure and with stage 5 chronic kidney disease, or end stage renal disease: Secondary | ICD-10-CM | POA: Diagnosis not present

## 2022-08-21 DIAGNOSIS — Z95811 Presence of heart assist device: Secondary | ICD-10-CM | POA: Diagnosis not present

## 2022-08-21 DIAGNOSIS — I5022 Chronic systolic (congestive) heart failure: Secondary | ICD-10-CM | POA: Diagnosis not present

## 2022-08-21 DIAGNOSIS — I4892 Unspecified atrial flutter: Secondary | ICD-10-CM | POA: Insufficient documentation

## 2022-08-21 DIAGNOSIS — E1122 Type 2 diabetes mellitus with diabetic chronic kidney disease: Secondary | ICD-10-CM | POA: Insufficient documentation

## 2022-08-21 DIAGNOSIS — Z951 Presence of aortocoronary bypass graft: Secondary | ICD-10-CM | POA: Diagnosis not present

## 2022-08-21 DIAGNOSIS — Z992 Dependence on renal dialysis: Secondary | ICD-10-CM | POA: Insufficient documentation

## 2022-08-21 DIAGNOSIS — Z794 Long term (current) use of insulin: Secondary | ICD-10-CM | POA: Insufficient documentation

## 2022-08-21 LAB — POCT INR: INR: 1.9 — AB (ref 2.0–3.0)

## 2022-08-21 MED ORDER — GABAPENTIN 100 MG PO CAPS: 100.0000 mg | ORAL_CAPSULE | ORAL | 3 refills | Status: DC

## 2022-08-21 MED ORDER — MIDODRINE HCL 10 MG PO TABS: 20.0000 mg | ORAL_TABLET | Freq: Three times a day (TID) | ORAL | 11 refills | Status: DC

## 2022-08-21 NOTE — Patient Instructions (Signed)
Follow up in [facility] in 2 months you will have an echocardiogram at this appointment Continue Midodrine '20mg'$  on Dialysis days Coumadin dosing per Ander Purpura [facility]

## 2022-08-21 NOTE — Progress Notes (Addendum)
Patient presents for 2 month f/u in [facility] today with brother Marcus Johnson). Reports no problems with VAD equipment or concerns with drive line.  Patient arrived via wheelchair today. Pt denies falls, heart failure symptoms, or signs of bleeding.   Patient reports he continues M/W/F dialysis. Pt confirms he takes Midodrine TID on HD days only. Pt states he has been taking increased dose of '20mg'$  of Midodrine due to pills breaking with he tries to use his pill cutter to split them. Reviewed with Dr.Gustie Bobb today. MD states increased dose is fine to continue as long as patient is not symptomatic.  Patient reports lightheadedness/dizziness and cramping after dialysis treatments, but this resolves with rest.    Pt on Keflex 500 mg BID (dosed for HD schedule). Marcus Johnson reports they are changing dressing weekly with improvement in drainage from wound. Dressing changed today in clinic. Currently wearing 2 anchors. Reports he wears binder at home, but does not like to wear during dialysis. See documentation below for dressing change details.   Reviewed labs from Dialysis center 05/17/22 with Dr. Aundra Dubin. Will fax order for lipid panel to dialysis center per Dr.Nafisa Olds.  Plan for echocardiogram prior to next visit per Dr. Aundra Dubin.  Vital Signs:  Doppler Pressure: 122 Automatc BP: 113/71 (96) HR: 90 SR O2: 96 % RA   Weight: 197.6 lbs w/ eqt Last wt: 191.6 w/ eqt   VAD Indication: Destination Therapy due to CKD Stage 3b   LVAD assessment:  HM III: Speed: 5800 rpms Flow: 5.3 Power: 4.7 w    PI: 3.3 Alarms: none Events: 30-50  Fixed speed:  5800 Low speed limit:  5500  Primary controller: back up battery due for replacement in 9 months Secondary controller:  back up battery due for replacement in 14 months  I reviewed the LVAD parameters from today and compared the results to the patient's prior recorded data. LVAD interrogation was NEGATIVE for significant power changes, NEGATIVE for clinical  alarms and STABLE for PI events/speed drops. No programming changes were made and pump is functioning within specified parameters. Pt is performing daily controller and system monitor self tests along with completing weekly and monthly maintenance for LVAD equipment.   LVAD equipment check completed and is in good working order. Back-up equipment present.    Annual Equipment Maintenance on UBC/PM was performed 10/12/21.  Exit Site Care: Dressing maintained weekly by pt's brother. Gauze VAD dressing and anchor removed and site care performed using sterile technique. Drive line exit site cleaned with Chlora prep applicators x 2, allowed to dry before gauze dressing NO SILVER STRIP applied. Exit site incorporated. The velour is fully implanted at exit site. No drainage, redness, tenderness, or foul odor noted. Small dried scab at exit site removed with cleansing. Drive line anchor re-applied x 2. Pt denies fever or chills. May continue weekly dressing using daily kits. Pt has adequate dressing kits for at home use.  Device: Medtronic single Therapies: on 182 bpm Pacing: VVI 40 Last check:  02/28/22   BP & Labs:  Doppler 122 - Doppler is reflecting modified systolic   Hgb 76.2- No S/S of bleeding. Specifically denies melena/BRBPR or nosebleeds.   LDH 276 - established baseline of 160 - 180. Denies tea-colored urine. No power elevations noted on interrogation.     Patient Instructions: No medication changes today Coumadin per Marcus Johnson Return to clinic in 2 months for follow up with Dr Aundra Dubin  Marcus Morton, RN 08/21/2022  Follow up for Heart Failure/LVAD:  Marcus Johnson. is a 62 y.o. male who has a h/o DM2, CAD s/p CABG 1856, systolic HF due to ischemic cardiomyopathy with EF 20-25% (echo 12/15), DM2 and CKD. He is s/p Medtronic single chamber ICD.   Admitted in 12/15 due to ADHF. Required short course milrinone for diuresis. Diuresed 30 pounds.    CPX 2/16 showed severely  reduced functional capacity.  There was a significant disconnect between his symptoms and the CPX.  RHC in 6/16 showed fairly normal filling pressures and low but not markedly low cardiac output.    CPX (4/19) was submaximal but suggested severe limitation from HF.    He was admitted in 6/19 with marked volume overload and dyspnea.  Echo in 6/19 showed EF 20% with severe global hypokinesis.  LHC/RHC showed severe native vessel CAD with patent LIMA-D, SVG-OM, and SVG-PLV; cardiac index low.  He was started on milrinone and diuresed.  We discussed LVAD extensively in light of low output, creatinine that is trending up and concern for decompensation of RV over time. He was adamant that he did not want to pursue LVAD workup yet and did not want a referral for transplant evaluation. Milrinone was weaned off and he was discharged home.    Admitted 08/01/18-08/07/18 with volume overload and left foot wound. Diuresed 38 lbs with IV lasix and then transitioned back to torsemide 100 mg BID. ID was consulted with concerns for left foot osteomyelitis noted on CT scan. He got IV ancef q8 hours x 42 days. [facility] provided teaching and supplies for home antibiotics. Course complicated by AKI. DC weight 200 lbs.    He went into atrial flutter and had DCCV to NSR in 1/20.    Echo in 4/21 showed EF 25% with apical akinesis and diffuse hypokinesis relatively preserved in lateral wall, mildly decreased RV systolic function, PASP 41 mmHg. CPX in 5/21 showed severe functional limitation suggestive of advanced HF. 5/21 Zio patch showed short atrial flutter runs, 3.8% PVCs.    He developed COVID-19 infection in 1/22.  Symptoms were predominantly GI.    He developed progressive worsening of CHF in early 2022 and was admitted in 3/22 with low output HF.  Impella 5.5 was placed as bridge to Heartmate III LVAD placed in 10/13/20.  He developed post-op renal failure and ended up on dialysis.  He was markedly deconditioned post-op and  went to CIR.    Still has pain and minimal function in his right hand.  Nerve conduction study showed a chronic sensorimotor neuropathy in the right arm.    Ramp echo in 3/23 showed severe LV dysfunction with mild RV enlargement and mild dysfunction, mild-moderate AI.  Speed was increased to 5800 rpm.  The aortic valve still opened with every beat but only slightly. Flow increased from 4.1 to 5.3 L/min and PI remained stable.   Patient was admitted in 4/23 with falls and lightheadedness. He fractured his left foot when falling and initially was unable to walk.  He was thought to be hypotensive due to over-diuresis and dry weight was increased.  Midodrine on HD days was also increased.  He was treated for a driveline infection with vancomycin then doxycycline, now off.   ICD shock for VT in 7/23, not started on amiodarone given only 1 episode.   Patient returns today for followup of CHF/LVAD.  No VT.  Weight is stable.  Overall, he seems to be doing better.  His right hand strength has improved considerably. He is getting  through HD without lightheadedness/hypotension.  Occasional cramps after HD.  He is not getting short of breath walking on flat ground or doing ADLs.  He does take 1 midodrine on non-HD days to keep his BP up. He has PI events on HD days, not on non-HD days.   I reviewed the LVAD parameters from today, and compared the results to the patient's prior recorded data.  No programming changes were made.  The LVAD is functioning within specified parameters.  The patient performs LVAD self-test daily.  LVAD interrogation was negative for any significant power changes, alarms or PI events/speed drops.  LVAD equipment check completed and is in good working order.  Back-up equipment present.   LVAD education done on emergency procedures and precautions and reviewed exit site care.  Labs (5/22): LDH 174, hgb 10.6 Labs (7/22): LDH 164 Labs (8/22): hgb 11.4 Labs (10/22): hgb 12 Labs (11/22):  hgb 12.2 Labs (4/23): LDL 163, hgb 10.4 Labs (6/23): hgb 12.2, LDH 284 Labs (7/23): LFTs normal, hgb 11.3, LDH 235 Labs (9/23): hgb 10.2 Labs (1/24): hgb 10.6, LDH 279   Past Medical History:  Diagnosis Date   AICD (automatic cardioverter/defibrillator) present    Single-chamber  implantable cardiac defibrillator - Medtronic   Atrial fibrillation (HCC)    Cataract    Mixed form OU   CHF (congestive heart failure) (HCC)    Chronic kidney disease    Chronic kidney disease (CKD), stage III (moderate) (HCC)    Chronic systolic heart failure ([place])    a. Echo 5/13: Mild LVE, mild LVH, EF 10%, anteroseptal, lateral, apical AK, mild MR, mild LAE, moderately reduced RVSF, mild RAE, PASP 60;  b. 07/2014 Echo: EF 20-2%, diff HK, AKI of antsep/apical/mid-apicalinferior, mod reduced RV.   Coronary artery disease    a. s/p CABG 2002 b. LHC 5/13:  dLM 80%, LAD subtotally occluded, pCFX occluded, pRCA 50%, mid? Occlusion with high take off of the PDA with 70% multiple lesions-not bypassed and supplies collaterals to LAD, LIMA-IM/ramus ok, S-OM ok, S-PLA branches ok. Medical therapy was recommended   COVID    Diabetic retinopathy ([place])    NPDR OD, PDR OS   Dyspnea    Gout    "on daily RX" (01/08/2018)   Hypertension    Hypertensive retinopathy    OU   Ischemic cardiomyopathy    a. Echo 5/13: Mild LVE, mild LVH, EF 10%, anteroseptal, lateral, apical AK, mild MR, mild LAE, moderately reduced RVSF, mild RAE, PASP 60;  b. 01/2012 s/p MDT [id number] Protecta XT VR AICD;  c. 07/2014 Echo: EF 20-2%, diff HK, AKI of antsep/apical/mid-apicalinferior, mod reduced RV.   MRSA (methicillin resistant Staphylococcus aureus)    Status post right foot plantar deep infection with MRSA status post  I&D 10/2008   Myocardial infarction [facility])    "was told I'd had several before heart OR in 2002" (01/08/2018)   Noncompliance    Peripheral neuropathy    Retinopathy, diabetic, background ([place])    Syncope    Type II diabetes  mellitus ([place])    requiring insulin    Vitreous hemorrhage, left (HCC)    and proliferative diabetic retinopathy    Current Outpatient Medications  Medication Sig Dispense Refill   atorvastatin (LIPITOR) 40 MG tablet Take 1 tablet (40 mg total) by mouth daily.     Blood Glucose Monitoring Suppl (ONE TOUCH ULTRA 2) w/Device KIT 1 Device by Does not apply route 3 (three) times daily. 1 kit 0   cephALEXin (  KEFLEX) 500 MG capsule Take 1 capsule (500 mg total) by mouth 2 (two) times daily. Take evening dose after dialysis on HD days. 120 capsule 1   glucose blood (ACCU-CHEK GUIDE) test strip Use as instructed 100 each 1   insulin glargine (LANTUS SOLOSTAR) 100 UNIT/ML Solostar Pen Inject 60 Units into the skin at bedtime. (Patient taking differently: Inject 50 Units into the skin daily as needed.) 30 mL 11   multivitamin (RENA-VIT) TABS tablet TAKE 1 TABLET BY MOUTH EVERYDAY AT BEDTIME 90 tablet 3   naphazoline-glycerin (CLEAR EYES REDNESS) 0.012-0.25 % SOLN Place 1-2 drops into the right eye 4 (four) times daily as needed for eye irritation.     OneTouch UltraSoft 2 Lancets MISC 1 each by Does not apply route 3 (three) times daily before meals. 100 each 1   pantoprazole (PROTONIX) 40 MG tablet Take 1 tablet (40 mg total) by mouth daily. 90 tablet 3   rosuvastatin (CRESTOR) 10 MG tablet TAKE 1 TABLET BY MOUTH EVERY DAY 90 tablet 3   sevelamer (RENAGEL) 800 MG tablet Take 800 mg by mouth 3 (three) times daily.     warfarin (COUMADIN) 3 MG tablet TAKE 1.5 TABLET EVERY TUESDAY/SATURDAY AND 1 TAB ALL OTHER DAYS OR AS DIRECTED BY HF CLINIC 103 tablet 6   gabapentin (NEURONTIN) 100 MG capsule Take 1-2 capsules (100-200 mg total) by mouth See admin instructions. Taking 1 capsule ( 100 mg) in the AM and 2 capsules ( 200 mg) at bedtime 90 capsule 3   midodrine (PROAMATINE) 10 MG tablet Take 2 tablets (20 mg total) by mouth 3 (three) times daily. Three times a day on dialysis days; NO midodrine on NON dialysis  days 135 tablet 11   traMADol (ULTRAM) 50 MG tablet Take 1 tablet (50 mg total) by mouth every 12 (twelve) hours as needed. (Patient not taking: Reported on 06/05/2022) 30 tablet 0   No current facility-administered medications for this encounter.    Meclizine hcl and Ivabradine  REVIEW OF SYSTEMS: All systems negative except as listed in HPI, PMH and Problem list.   LVAD INTERROGATION:  Please see LVAD nurse's note above.   BP (!) 122/0   Pulse 90   Ht 5' 10.5" (1.791 m)   Wt 89.6 kg (197 lb 9.6 oz) Comment: with equipment  SpO2 96%   BMI 27.95 kg/m  MAP 96  Physical Exam: General: Well appearing this am. NAD.  HEENT: Normal. Neck: Supple, JVP 7-8 cm. Carotids OK.  Cardiac:  Mechanical heart sounds with LVAD hum present.  Lungs:  CTAB, normal effort.  Abdomen:  NT, ND, no HSM. No bruits or masses. +BS  LVAD exit site: Well-healed and incorporated. Dressing dry and intact. No erythema or drainage. Stabilization device present and accurately applied. Driveline dressing changed daily per sterile technique. Extremities:  Warm and dry. No cyanosis, clubbing, rash, or edema.  Neuro:  Alert & oriented x 3. Cranial nerves grossly intact. Moves all 4 extremities w/o difficulty. Affect pleasant    ASSESSMENT AND PLAN:  1. Chronic systolic CHF:  Ischemic cardiomyopathy. Has Medtronic ICD. Low output HF, admitted and Impella 5.5 placed 10/04/20.  HM3 LVAD was placed on 10/13/20. Developed post-op renal failure requiring eventual hemodialysis.  Ramp echo in 3/23, speed increased to 5800 rpm under echo guidance with increase in flow and stable PI.  MAP mildly elevated today but drops on HD days without midodrine.  He is not volume overloaded.  - Continue midodrine 20 mg tid on  HD days.   - I will get echo at next appointment.  2. LVAD:  VAD interrogated personally.  Parameters stable with PI events on HD days.  - He is now off ASA.  - Continue warfarin with INR goal 2-2.5.   3. CAD s/p CABG  2002:  Last cath in 6/19 with patent grafts. No chest pain.  - Continue Crestor.  4. ESRD: Currently dialyzing via tunneled catheter. No low flow events.  - Continue HD M-W-F  - With continuous flow LVAD, think AV fistula would be unlikely to mature.   5. Atrial flutter/fibrillation: s/p DC-CV 08/21/18.  Has occasional episodes of AF but not prolonged.  6. 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 This has been improving, his grip is significantly better today.  7. DM: Per primary care.  8. Anemia: Hgb stable on recent labs in 1/24.    9. Suspected Gastroparesis: He is no longer taking Reglan.  10. VT: Episode in 7/23 with successful ICD discharge.  No definite trigger, he did not actually feel the VT. No further episodes.  - As this was his first episode and there has been no recurrence, we did not start amiodarone.  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 Driveline site looks good today.   Loralie Champagne 08/21/2022

## 2022-08-22 ENCOUNTER — Other Ambulatory Visit (HOSPITAL_COMMUNITY): Payer: Self-pay

## 2022-08-22 DIAGNOSIS — Z95811 Presence of heart assist device: Secondary | ICD-10-CM

## 2022-08-22 DIAGNOSIS — I5042 Chronic combined systolic (congestive) and diastolic (congestive) heart failure: Secondary | ICD-10-CM

## 2022-08-22 DIAGNOSIS — N186 End stage renal disease: Secondary | ICD-10-CM | POA: Diagnosis not present

## 2022-08-22 DIAGNOSIS — N2581 Secondary hyperparathyroidism of renal origin: Secondary | ICD-10-CM | POA: Diagnosis not present

## 2022-08-22 DIAGNOSIS — Z992 Dependence on renal dialysis: Secondary | ICD-10-CM | POA: Diagnosis not present

## 2022-08-24 DIAGNOSIS — N2581 Secondary hyperparathyroidism of renal origin: Secondary | ICD-10-CM | POA: Diagnosis not present

## 2022-08-24 DIAGNOSIS — N186 End stage renal disease: Secondary | ICD-10-CM | POA: Diagnosis not present

## 2022-08-24 DIAGNOSIS — Z992 Dependence on renal dialysis: Secondary | ICD-10-CM | POA: Diagnosis not present

## 2022-08-27 DIAGNOSIS — N2581 Secondary hyperparathyroidism of renal origin: Secondary | ICD-10-CM | POA: Diagnosis not present

## 2022-08-27 DIAGNOSIS — N186 End stage renal disease: Secondary | ICD-10-CM | POA: Diagnosis not present

## 2022-08-27 DIAGNOSIS — Z992 Dependence on renal dialysis: Secondary | ICD-10-CM | POA: Diagnosis not present

## 2022-08-28 ENCOUNTER — Ambulatory Visit (HOSPITAL_COMMUNITY): Payer: Self-pay | Admitting: Pharmacist

## 2022-08-28 LAB — POCT INR: INR: 1.7 — AB (ref 2.0–3.0)

## 2022-08-29 DIAGNOSIS — Z992 Dependence on renal dialysis: Secondary | ICD-10-CM | POA: Diagnosis not present

## 2022-08-29 DIAGNOSIS — N186 End stage renal disease: Secondary | ICD-10-CM | POA: Diagnosis not present

## 2022-08-29 DIAGNOSIS — N2581 Secondary hyperparathyroidism of renal origin: Secondary | ICD-10-CM | POA: Diagnosis not present

## 2022-08-31 DIAGNOSIS — N2581 Secondary hyperparathyroidism of renal origin: Secondary | ICD-10-CM | POA: Diagnosis not present

## 2022-08-31 DIAGNOSIS — N186 End stage renal disease: Secondary | ICD-10-CM | POA: Diagnosis not present

## 2022-08-31 DIAGNOSIS — Z992 Dependence on renal dialysis: Secondary | ICD-10-CM | POA: Diagnosis not present

## 2022-09-03 DIAGNOSIS — N2581 Secondary hyperparathyroidism of renal origin: Secondary | ICD-10-CM | POA: Diagnosis not present

## 2022-09-03 DIAGNOSIS — N186 End stage renal disease: Secondary | ICD-10-CM | POA: Diagnosis not present

## 2022-09-03 DIAGNOSIS — Z992 Dependence on renal dialysis: Secondary | ICD-10-CM | POA: Diagnosis not present

## 2022-09-04 ENCOUNTER — Ambulatory Visit (HOSPITAL_COMMUNITY): Payer: Self-pay | Admitting: Pharmacist

## 2022-09-04 LAB — POCT INR: INR: 1.5 — AB (ref 2.0–3.0)

## 2022-09-05 DIAGNOSIS — I509 Heart failure, unspecified: Secondary | ICD-10-CM | POA: Diagnosis not present

## 2022-09-05 DIAGNOSIS — Z992 Dependence on renal dialysis: Secondary | ICD-10-CM | POA: Diagnosis not present

## 2022-09-05 DIAGNOSIS — N2581 Secondary hyperparathyroidism of renal origin: Secondary | ICD-10-CM | POA: Diagnosis not present

## 2022-09-05 DIAGNOSIS — N186 End stage renal disease: Secondary | ICD-10-CM | POA: Diagnosis not present

## 2022-09-07 DIAGNOSIS — Z992 Dependence on renal dialysis: Secondary | ICD-10-CM | POA: Diagnosis not present

## 2022-09-07 DIAGNOSIS — N2581 Secondary hyperparathyroidism of renal origin: Secondary | ICD-10-CM | POA: Diagnosis not present

## 2022-09-07 DIAGNOSIS — N186 End stage renal disease: Secondary | ICD-10-CM | POA: Diagnosis not present

## 2022-09-10 DIAGNOSIS — N2581 Secondary hyperparathyroidism of renal origin: Secondary | ICD-10-CM | POA: Diagnosis not present

## 2022-09-10 DIAGNOSIS — Z992 Dependence on renal dialysis: Secondary | ICD-10-CM | POA: Diagnosis not present

## 2022-09-10 DIAGNOSIS — N186 End stage renal disease: Secondary | ICD-10-CM | POA: Diagnosis not present

## 2022-09-11 ENCOUNTER — Ambulatory Visit (HOSPITAL_COMMUNITY): Payer: Self-pay | Admitting: Pharmacist

## 2022-09-11 DIAGNOSIS — Z7901 Long term (current) use of anticoagulants: Secondary | ICD-10-CM | POA: Diagnosis not present

## 2022-09-11 LAB — POCT INR: INR: 1.7 — AB (ref 2.0–3.0)

## 2022-09-12 DIAGNOSIS — N186 End stage renal disease: Secondary | ICD-10-CM | POA: Diagnosis not present

## 2022-09-12 DIAGNOSIS — N2581 Secondary hyperparathyroidism of renal origin: Secondary | ICD-10-CM | POA: Diagnosis not present

## 2022-09-12 DIAGNOSIS — Z992 Dependence on renal dialysis: Secondary | ICD-10-CM | POA: Diagnosis not present

## 2022-09-14 DIAGNOSIS — N186 End stage renal disease: Secondary | ICD-10-CM | POA: Diagnosis not present

## 2022-09-14 DIAGNOSIS — Z992 Dependence on renal dialysis: Secondary | ICD-10-CM | POA: Diagnosis not present

## 2022-09-14 DIAGNOSIS — N2581 Secondary hyperparathyroidism of renal origin: Secondary | ICD-10-CM | POA: Diagnosis not present

## 2022-09-17 DIAGNOSIS — Z992 Dependence on renal dialysis: Secondary | ICD-10-CM | POA: Diagnosis not present

## 2022-09-17 DIAGNOSIS — N2581 Secondary hyperparathyroidism of renal origin: Secondary | ICD-10-CM | POA: Diagnosis not present

## 2022-09-17 DIAGNOSIS — N186 End stage renal disease: Secondary | ICD-10-CM | POA: Diagnosis not present

## 2022-09-18 ENCOUNTER — Ambulatory Visit (HOSPITAL_COMMUNITY): Payer: Self-pay | Admitting: Pharmacist

## 2022-09-18 LAB — POCT INR: INR: 1.9 — AB (ref 2.0–3.0)

## 2022-09-19 DIAGNOSIS — N2581 Secondary hyperparathyroidism of renal origin: Secondary | ICD-10-CM | POA: Diagnosis not present

## 2022-09-19 DIAGNOSIS — N186 End stage renal disease: Secondary | ICD-10-CM | POA: Diagnosis not present

## 2022-09-19 DIAGNOSIS — Z992 Dependence on renal dialysis: Secondary | ICD-10-CM | POA: Diagnosis not present

## 2022-09-21 DIAGNOSIS — N2581 Secondary hyperparathyroidism of renal origin: Secondary | ICD-10-CM | POA: Diagnosis not present

## 2022-09-21 DIAGNOSIS — Z992 Dependence on renal dialysis: Secondary | ICD-10-CM | POA: Diagnosis not present

## 2022-09-21 DIAGNOSIS — N186 End stage renal disease: Secondary | ICD-10-CM | POA: Diagnosis not present

## 2022-09-24 DIAGNOSIS — Z992 Dependence on renal dialysis: Secondary | ICD-10-CM | POA: Diagnosis not present

## 2022-09-24 DIAGNOSIS — N2581 Secondary hyperparathyroidism of renal origin: Secondary | ICD-10-CM | POA: Diagnosis not present

## 2022-09-24 DIAGNOSIS — N186 End stage renal disease: Secondary | ICD-10-CM | POA: Diagnosis not present

## 2022-09-25 ENCOUNTER — Ambulatory Visit (HOSPITAL_COMMUNITY): Payer: Self-pay | Admitting: Pharmacist

## 2022-09-25 LAB — POCT INR: INR: 1.9 — AB (ref 2.0–3.0)

## 2022-09-26 DIAGNOSIS — N2581 Secondary hyperparathyroidism of renal origin: Secondary | ICD-10-CM | POA: Diagnosis not present

## 2022-09-26 DIAGNOSIS — Z992 Dependence on renal dialysis: Secondary | ICD-10-CM | POA: Diagnosis not present

## 2022-09-26 DIAGNOSIS — N186 End stage renal disease: Secondary | ICD-10-CM | POA: Diagnosis not present

## 2022-09-28 DIAGNOSIS — N186 End stage renal disease: Secondary | ICD-10-CM | POA: Diagnosis not present

## 2022-09-28 DIAGNOSIS — N2581 Secondary hyperparathyroidism of renal origin: Secondary | ICD-10-CM | POA: Diagnosis not present

## 2022-09-28 DIAGNOSIS — Z992 Dependence on renal dialysis: Secondary | ICD-10-CM | POA: Diagnosis not present

## 2022-10-01 DIAGNOSIS — N186 End stage renal disease: Secondary | ICD-10-CM | POA: Diagnosis not present

## 2022-10-01 DIAGNOSIS — N2581 Secondary hyperparathyroidism of renal origin: Secondary | ICD-10-CM | POA: Diagnosis not present

## 2022-10-01 DIAGNOSIS — Z992 Dependence on renal dialysis: Secondary | ICD-10-CM | POA: Diagnosis not present

## 2022-10-02 ENCOUNTER — Ambulatory Visit (HOSPITAL_COMMUNITY): Payer: Self-pay | Admitting: Pharmacist

## 2022-10-02 ENCOUNTER — Ambulatory Visit (INDEPENDENT_AMBULATORY_CARE_PROVIDER_SITE_OTHER): Payer: Medicare HMO | Admitting: Podiatry

## 2022-10-02 ENCOUNTER — Encounter: Payer: Self-pay | Admitting: Podiatry

## 2022-10-02 DIAGNOSIS — B351 Tinea unguium: Secondary | ICD-10-CM | POA: Diagnosis not present

## 2022-10-02 DIAGNOSIS — M79674 Pain in right toe(s): Secondary | ICD-10-CM

## 2022-10-02 DIAGNOSIS — Z794 Long term (current) use of insulin: Secondary | ICD-10-CM

## 2022-10-02 DIAGNOSIS — N186 End stage renal disease: Secondary | ICD-10-CM | POA: Diagnosis not present

## 2022-10-02 DIAGNOSIS — M79675 Pain in left toe(s): Secondary | ICD-10-CM | POA: Diagnosis not present

## 2022-10-02 DIAGNOSIS — Q828 Other specified congenital malformations of skin: Secondary | ICD-10-CM | POA: Insufficient documentation

## 2022-10-02 DIAGNOSIS — Z992 Dependence on renal dialysis: Secondary | ICD-10-CM

## 2022-10-02 DIAGNOSIS — E1122 Type 2 diabetes mellitus with diabetic chronic kidney disease: Secondary | ICD-10-CM

## 2022-10-02 LAB — POCT INR: INR: 1.7 — AB (ref 2.0–3.0)

## 2022-10-02 NOTE — Progress Notes (Signed)
This patient presents  to my office for at risk foot care.  This patient requires this care by a professional since this patient will be at risk due to having  CKD, coagulation defect and DM.  This patient is unable to cut nails himself since the patient cannot reach his nails.These nails are painful walking and wearing shoes. Painful callus left foot.Marcus Johnson  He presents to the office with his brother.  This patient presents for at risk foot care today.  General Appearance  Alert, conversant and in no acute stress.  Vascular  Dorsalis pedis and posterior tibial  pulses are absent  bilaterally.  Capillary return is within normal limits  bilaterally. Temperature is within normal limits  bilaterally.  Neurologic  Senn-Weinstein monofilament wire test absent  bilaterally. Muscle power within normal limits bilaterally.  Nails Thick disfigured discolored nails with subungual debris  from hallux to fifth toes bilaterally. No evidence of bacterial infection or drainage bilaterally.  Orthopedic  No limitations of motion  feet .  No crepitus or effusions noted.  No bony pathology or digital deformities noted.  No  STJ ROM left foot.  Skin  normotropic skin with no porokeratosis noted bilaterally.  No signs of infections or ulcers noted.   Callus lateral aspect fifth metabase left foot..  Onychomycosis  Pain in right toes  Pain in left toes  Consent was obtained for treatment procedures.   Mechanical debridement of nails 1-5  bilaterally performed with a nail nipper.  Filed with dremel without incident.   Debride porokeratosis left foot with dremel tool.    Return office visit     3 months                 Told patient to return for periodic foot care and evaluation due to potential at risk complications.   Gardiner Barefoot DPM

## 2022-10-03 DIAGNOSIS — N186 End stage renal disease: Secondary | ICD-10-CM | POA: Diagnosis not present

## 2022-10-03 DIAGNOSIS — Z992 Dependence on renal dialysis: Secondary | ICD-10-CM | POA: Diagnosis not present

## 2022-10-03 DIAGNOSIS — N2581 Secondary hyperparathyroidism of renal origin: Secondary | ICD-10-CM | POA: Diagnosis not present

## 2022-10-04 ENCOUNTER — Other Ambulatory Visit: Payer: Self-pay | Admitting: Family Medicine

## 2022-10-04 DIAGNOSIS — Z992 Dependence on renal dialysis: Secondary | ICD-10-CM | POA: Diagnosis not present

## 2022-10-04 DIAGNOSIS — N186 End stage renal disease: Secondary | ICD-10-CM | POA: Diagnosis not present

## 2022-10-04 DIAGNOSIS — I509 Heart failure, unspecified: Secondary | ICD-10-CM | POA: Diagnosis not present

## 2022-10-05 DIAGNOSIS — N186 End stage renal disease: Secondary | ICD-10-CM | POA: Diagnosis not present

## 2022-10-05 DIAGNOSIS — Z992 Dependence on renal dialysis: Secondary | ICD-10-CM | POA: Diagnosis not present

## 2022-10-05 DIAGNOSIS — N2581 Secondary hyperparathyroidism of renal origin: Secondary | ICD-10-CM | POA: Diagnosis not present

## 2022-10-08 DIAGNOSIS — N186 End stage renal disease: Secondary | ICD-10-CM | POA: Diagnosis not present

## 2022-10-08 DIAGNOSIS — N2581 Secondary hyperparathyroidism of renal origin: Secondary | ICD-10-CM | POA: Diagnosis not present

## 2022-10-08 DIAGNOSIS — Z992 Dependence on renal dialysis: Secondary | ICD-10-CM | POA: Diagnosis not present

## 2022-10-09 ENCOUNTER — Ambulatory Visit (HOSPITAL_COMMUNITY): Payer: Self-pay | Admitting: Pharmacist

## 2022-10-09 ENCOUNTER — Ambulatory Visit (INDEPENDENT_AMBULATORY_CARE_PROVIDER_SITE_OTHER): Payer: Medicare HMO

## 2022-10-09 ENCOUNTER — Other Ambulatory Visit: Payer: Self-pay

## 2022-10-09 DIAGNOSIS — Z7901 Long term (current) use of anticoagulants: Secondary | ICD-10-CM | POA: Diagnosis not present

## 2022-10-09 DIAGNOSIS — I255 Ischemic cardiomyopathy: Secondary | ICD-10-CM

## 2022-10-09 DIAGNOSIS — R112 Nausea with vomiting, unspecified: Secondary | ICD-10-CM

## 2022-10-09 LAB — CUP PACEART REMOTE DEVICE CHECK
Battery Remaining Longevity: 123 mo
Battery Voltage: 3.02 V
Brady Statistic RV Percent Paced: 0.01 %
Date Time Interrogation Session: 20240305084728
HighPow Impedance: 35 Ohm
HighPow Impedance: 40 Ohm
Implantable Lead Connection Status: 753985
Implantable Lead Implant Date: 20130603
Implantable Lead Location: 753860
Implantable Lead Model: 6947
Implantable Pulse Generator Implant Date: 20211129
Lead Channel Impedance Value: 361 Ohm
Lead Channel Impedance Value: 646 Ohm
Lead Channel Pacing Threshold Amplitude: 1.25 V
Lead Channel Pacing Threshold Pulse Width: 0.4 ms
Lead Channel Sensing Intrinsic Amplitude: 5.125 mV
Lead Channel Sensing Intrinsic Amplitude: 5.125 mV
Lead Channel Setting Pacing Amplitude: 2.75 V
Lead Channel Setting Pacing Pulse Width: 0.4 ms
Lead Channel Setting Sensing Sensitivity: 0.3 mV
Zone Setting Status: 755011
Zone Setting Status: 755011

## 2022-10-09 LAB — POCT INR: INR: 1.9 — AB (ref 2.0–3.0)

## 2022-10-09 MED ORDER — ONDANSETRON 4 MG PO TBDP: 4.0000 mg | ORAL_TABLET | Freq: Three times a day (TID) | ORAL | Status: DC | PRN

## 2022-10-10 DIAGNOSIS — Z992 Dependence on renal dialysis: Secondary | ICD-10-CM | POA: Diagnosis not present

## 2022-10-10 DIAGNOSIS — N186 End stage renal disease: Secondary | ICD-10-CM | POA: Diagnosis not present

## 2022-10-10 DIAGNOSIS — N2581 Secondary hyperparathyroidism of renal origin: Secondary | ICD-10-CM | POA: Diagnosis not present

## 2022-10-11 ENCOUNTER — Other Ambulatory Visit (HOSPITAL_COMMUNITY): Payer: Self-pay | Admitting: *Deleted

## 2022-10-11 DIAGNOSIS — R110 Nausea: Secondary | ICD-10-CM

## 2022-10-11 DIAGNOSIS — Z95811 Presence of heart assist device: Secondary | ICD-10-CM

## 2022-10-11 MED ORDER — ONDANSETRON HCL 4 MG PO TABS: 4.0000 mg | ORAL_TABLET | Freq: Three times a day (TID) | ORAL | 3 refills | Status: DC | PRN

## 2022-10-12 DIAGNOSIS — N2581 Secondary hyperparathyroidism of renal origin: Secondary | ICD-10-CM | POA: Diagnosis not present

## 2022-10-12 DIAGNOSIS — N186 End stage renal disease: Secondary | ICD-10-CM | POA: Diagnosis not present

## 2022-10-12 DIAGNOSIS — Z992 Dependence on renal dialysis: Secondary | ICD-10-CM | POA: Diagnosis not present

## 2022-10-15 DIAGNOSIS — Z992 Dependence on renal dialysis: Secondary | ICD-10-CM | POA: Diagnosis not present

## 2022-10-15 DIAGNOSIS — N2581 Secondary hyperparathyroidism of renal origin: Secondary | ICD-10-CM | POA: Diagnosis not present

## 2022-10-15 DIAGNOSIS — N186 End stage renal disease: Secondary | ICD-10-CM | POA: Diagnosis not present

## 2022-10-16 ENCOUNTER — Telehealth (INDEPENDENT_AMBULATORY_CARE_PROVIDER_SITE_OTHER): Payer: Medicare HMO | Admitting: Internal Medicine

## 2022-10-16 ENCOUNTER — Other Ambulatory Visit: Payer: Self-pay

## 2022-10-16 ENCOUNTER — Encounter: Payer: Self-pay | Admitting: Internal Medicine

## 2022-10-16 ENCOUNTER — Ambulatory Visit (HOSPITAL_COMMUNITY): Payer: Self-pay | Admitting: Pharmacist

## 2022-10-16 VITALS — Wt 186.0 lb

## 2022-10-16 DIAGNOSIS — T827XXA Infection and inflammatory reaction due to other cardiac and vascular devices, implants and grafts, initial encounter: Secondary | ICD-10-CM | POA: Diagnosis not present

## 2022-10-16 LAB — POCT INR: INR: 2.2 (ref 2.0–3.0)

## 2022-10-16 MED ORDER — CEPHALEXIN 500 MG PO CAPS: 500.0000 mg | ORAL_CAPSULE | Freq: Two times a day (BID) | ORAL | 1 refills | Status: DC

## 2022-10-16 NOTE — Assessment & Plan Note (Signed)
Patient here today via video visit for routine follow-up of LVAD driveline infection secondary to MSSA.  Recent follow-up in January at the heart failure clinic noted his driveline site looked good and patient has no new complaints today.  Will plan to continue Keflex 500 mg twice daily dosed for his HD schedule and send refills today.  The anticipation is that he will likely be on antibiotics indefinitely given the presumed seeding of his driveline/prosthetic material.  RTC 3 months for video visit.

## 2022-10-16 NOTE — Progress Notes (Signed)
[facility]  CHIEF COMPLAINT:    Follow up for LVAD driveline infection  SUBJECTIVE:    Marcus Johnson. is a 62 y.o. 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   Patient is here today for planned 50-monthfollow-up.  He was last seen on 07/03/2022.  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  Since being seen in November, he reports no significant issues.  He had follow-up with Dr. MAundra Dubinon 04/22/2023.  At that time there was no drainage, redness, tenderness, or foul odor noted.  He reports no new complaints other than some nausea with HD.  He thinks maybe they're pulling too much fluid off during HD.   Please see A&P for the details of today's visit and status of the patient's medical problems.   Patient's Medications  New Prescriptions   No medications on file  Previous Medications   ACETAMINOPHEN (TYLENOL) 325 MG TABLET    Take by mouth.   ALLOPURINOL (ZYLOPRIM) 300 MG TABLET    Take by mouth.   ATORVASTATIN (LIPITOR) 40 MG TABLET    Take 1 tablet (40 mg total) by mouth daily.   BLOOD GLUCOSE MONITORING SUPPL (ONE TOUCH ULTRA 2) W/DEVICE KIT    1 Device by Does not apply route 3 (three) times daily.   GABAPENTIN (NEURONTIN) 100 MG CAPSULE    Take 1-2 capsules (100-200 mg total) by mouth See admin instructions. Taking 1 capsule ( 100 mg) in the AM and 2 capsules ( 200 mg) at bedtime   GLUCOSE BLOOD (ACCU-CHEK GUIDE) TEST STRIP    USE AS INSTRUCTED 3 TIMES DAILY   INSULIN GLARGINE (LANTUS SOLOSTAR) 100 UNIT/ML SOLOSTAR PEN    Inject 60 Units into the skin at bedtime.   MIDODRINE (PROAMATINE) 10 MG TABLET    Take 2 tablets (20 mg total) by mouth 3 (three) times daily. Three times a day on dialysis days; NO midodrine on NON dialysis days   MULTIVITAMIN (RENA-VIT) TABS TABLET    TAKE 1 TABLET BY MOUTH  EVERYDAY AT BEDTIME   NAPHAZOLINE-GLYCERIN (CLEAR EYES REDNESS) 0.012-0.25 % SOLN    Place 1-2 drops into the right eye 4 (four) times daily as needed for eye irritation.   ONDANSETRON (ZOFRAN) 4 MG TABLET    Take 1 tablet (4 mg total) by mouth every 8 (eight) hours as needed for nausea or vomiting.   ONETOUCH ULTRASOFT 2 LANCETS MISC    1 each by Does not apply route 3 (three) times daily before meals.   PANTOPRAZOLE (PROTONIX) 40 MG TABLET    Take 1 tablet (40 mg total) by mouth daily.   POLYETHYLENE GLYCOL POWDER (GLYCOLAX/MIRALAX) 17 GM/SCOOP POWDER    Glycolax/Miralax 17 gm   ROSUVASTATIN (CRESTOR) 10 MG TABLET    TAKE 1 TABLET BY MOUTH EVERY DAY   SEVELAMER (RENAGEL) 800 MG TABLET    Take 800 mg by mouth 3 (three) times daily.   TRAMADOL (ULTRAM) 50 MG TABLET    Take 1 tablet (50 mg total) by mouth every 12 (twelve) hours as needed.   WARFARIN (COUMADIN) 3 MG TABLET    TAKE 1.5 TABLET EVERY TUESDAY/SATURDAY AND 1 TAB ALL OTHER DAYS OR AS DIRECTED BY HF CLINIC  Modified Medications   Modified Medication Previous Medication   CEPHALEXIN (  KEFLEX) 500 MG CAPSULE cephALEXin (KEFLEX) 500 MG capsule      Take 1 capsule (500 mg total) by mouth 2 (two) times daily. Take evening dose after dialysis on HD days.    Take 1 capsule (500 mg total) by mouth 2 (two) times daily. Take evening dose after dialysis on HD days.  Discontinued Medications   No medications on file      Past Medical History:  Diagnosis Date   AICD (automatic cardioverter/defibrillator) present    Single-chamber  implantable cardiac defibrillator - [employer]   Atrial fibrillation (HCC)    Cataract    Mixed form OU   CHF (congestive heart failure) (HCC)    Chronic kidney disease    Chronic kidney disease (CKD), stage III (moderate) (HCC)    Chronic systolic heart failure ([place])    a. Echo 5/13: Mild LVE, mild LVH, EF 10%, anteroseptal, lateral, apical AK, mild MR, mild LAE, moderately reduced RVSF, mild RAE, PASP 60;  b.  07/2014 Echo: EF 20-2%, diff HK, AKI of antsep/apical/mid-apicalinferior, mod reduced RV.   Coronary artery disease    a. s/p CABG 2002 b. LHC 5/13:  dLM 80%, LAD subtotally occluded, pCFX occluded, pRCA 50%, mid? Occlusion with high take off of the PDA with 70% multiple lesions-not bypassed and supplies collaterals to LAD, LIMA-IM/ramus ok, S-OM ok, S-PLA branches ok. Medical therapy was recommended   COVID    Diabetic retinopathy ([place])    NPDR OD, PDR OS   Dyspnea    Gout    "on daily RX" (01/08/2018)   Hypertension    Hypertensive retinopathy    OU   Ischemic cardiomyopathy    a. Echo 5/13: Mild LVE, mild LVH, EF 10%, anteroseptal, lateral, apical AK, mild MR, mild LAE, moderately reduced RVSF, mild RAE, PASP 60;  b. 01/2012 s/p MDT [id number] Protecta XT VR AICD;  c. 07/2014 Echo: EF 20-2%, diff HK, AKI of antsep/apical/mid-apicalinferior, mod reduced RV.   MRSA (methicillin resistant Staphylococcus aureus)    Status post right foot plantar deep infection with MRSA status post  I&D 10/2008   Myocardial infarction [facility])    "was told I'd had several before heart OR in 2002" (01/08/2018)   Noncompliance    Peripheral neuropathy    Retinopathy, diabetic, background ([place])    Syncope    Type II diabetes mellitus ([place])    requiring insulin    Vitreous hemorrhage, left (HCC)    and proliferative diabetic retinopathy    Social History   Tobacco Use   Smoking status: Never   Smokeless tobacco: Never  Vaping Use   Vaping Use: Never used  Substance Use Topics   Alcohol use: No    Alcohol/week: 0.0 standard drinks of alcohol   Drug use: No    Family History  Problem Relation Age of Onset   Diabetes Father        died in his 65's   Hypertension Mother        died in her 25's - had a ppm.   Amblyopia Neg Hx    Blindness Neg Hx    Cataracts Neg Hx    Glaucoma Neg Hx    Macular degeneration Neg Hx    Retinal detachment Neg Hx    Strabismus Neg Hx    Retinitis pigmentosa Neg Hx      Allergies  Allergen Reactions   Meclizine Hcl Anaphylaxis and Swelling   Ivabradine Nausea Only    Review of Systems  Constitutional: Negative.  Respiratory: Negative.    Cardiovascular: Negative.   Gastrointestinal:  Positive for nausea.  All other systems reviewed and are negative.    OBJECTIVE:    Vitals:   10/16/22 1433  Weight: 186 lb (84.4 kg)   Body mass index is 26.31 kg/m.  Physical Exam Constitutional:      Appearance: Normal appearance.  HENT:     Head: Normocephalic and atraumatic.  Eyes:     Extraocular Movements: Extraocular movements intact.     Conjunctiva/sclera: Conjunctivae normal.  Pulmonary:     Effort: Pulmonary effort is normal. No respiratory distress.  Musculoskeletal:     Cervical back: Normal range of motion and neck supple.  Skin:    General: Skin is warm and dry.  Neurological:     General: No focal deficit present.     Mental Status: He is alert and oriented to person, place, and time.  Psychiatric:        Mood and Affect: Mood normal.        Behavior: Behavior normal.      Labs and Microbiology:    Latest Ref Rng & Units 02/28/2022   10:50 AM 11/08/2021   12:48 AM 11/07/2021    1:10 AM  CBC  WBC 4.0 - 10.5 K/uL 7.7  7.7  7.2   Hemoglobin 13.0 - 17.0 g/dL 11.3  9.4  9.8   Hematocrit 39.0 - 52.0 % 34.1  28.0  29.9   Platelets 150 - 400 K/uL 166  157  153       Latest Ref Rng & Units 02/28/2022   10:50 AM 11/08/2021   12:48 AM 11/07/2021    1:10 AM  CMP  Glucose 70 - 99 mg/dL 192  96  111   BUN 8 - 23 mg/dL 67  42  23   Creatinine 0.61 - 1.24 mg/dL 6.80  4.10  2.97   Sodium 135 - 145 mmol/L 139  135  138   Potassium 3.5 - 5.1 mmol/L 4.2  4.2  3.9   Chloride 98 - 111 mmol/L 99  101  103   CO2 22 - 32 mmol/L '27  26  27   '$ Calcium 8.9 - 10.3 mg/dL 9.2  9.5  9.1   Total Protein 6.5 - 8.1 g/dL 7.7     Total Bilirubin 0.3 - 1.2 mg/dL 0.7     Alkaline Phos 38 - 126 U/L 109     AST 15 - 41 U/L 38     ALT 0 - 44 U/L 23         No results found for this or any previous visit (from the past 240 hour(s)).  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  Recent follow-up in January at the heart failure clinic noted his driveline site looked good and patient has no new complaints today.  Will plan to continue Keflex 500 mg twice daily dosed for his HD schedule and send refills today.  The anticipation is that he will likely be on antibiotics indefinitely given the presumed seeding of his driveline/prosthetic material.  RTC 3 months for video visit.   No orders of the defined types were placed in this encounter.      [facility] 10/16/2022, 2:52 PM   Virtual Visit via Video Note  I connected with Marcus Johnson. on 10/16/22 at 2:52 PM by video and verified that I am speaking with the correct person using two identifiers.   I discussed the limitations, risks, security and privacy concerns of performing an evaluation and management service by telephone and the availability of in person appointments. I also discussed with the patient that there may be a patient responsible charge related to this service. The patient expressed understanding and agreed to proceed.  Patient location: home My location: [facility] Duration of call: 5 min on video (15 min in chart)

## 2022-10-17 DIAGNOSIS — N2581 Secondary hyperparathyroidism of renal origin: Secondary | ICD-10-CM | POA: Diagnosis not present

## 2022-10-17 DIAGNOSIS — Z992 Dependence on renal dialysis: Secondary | ICD-10-CM | POA: Diagnosis not present

## 2022-10-17 DIAGNOSIS — N186 End stage renal disease: Secondary | ICD-10-CM | POA: Diagnosis not present

## 2022-10-19 ENCOUNTER — Telehealth: Payer: Self-pay | Admitting: *Deleted

## 2022-10-19 DIAGNOSIS — N2581 Secondary hyperparathyroidism of renal origin: Secondary | ICD-10-CM | POA: Diagnosis not present

## 2022-10-19 DIAGNOSIS — Z992 Dependence on renal dialysis: Secondary | ICD-10-CM | POA: Diagnosis not present

## 2022-10-19 DIAGNOSIS — N186 End stage renal disease: Secondary | ICD-10-CM | POA: Diagnosis not present

## 2022-10-22 DIAGNOSIS — N2581 Secondary hyperparathyroidism of renal origin: Secondary | ICD-10-CM | POA: Diagnosis not present

## 2022-10-22 DIAGNOSIS — Z992 Dependence on renal dialysis: Secondary | ICD-10-CM | POA: Diagnosis not present

## 2022-10-22 DIAGNOSIS — N186 End stage renal disease: Secondary | ICD-10-CM | POA: Diagnosis not present

## 2022-10-23 ENCOUNTER — Ambulatory Visit (HOSPITAL_BASED_OUTPATIENT_CLINIC_OR_DEPARTMENT_OTHER)
Admission: RE | Admit: 2022-10-23 | Discharge: 2022-10-23 | Disposition: A | Discharge status: Home / Self Care | Payer: Medicare HMO | Source: Ambulatory Visit | Attending: Cardiology | Admitting: Cardiology

## 2022-10-23 ENCOUNTER — Ambulatory Visit (HOSPITAL_COMMUNITY)
Admission: RE | Admit: 2022-10-23 | Discharge: 2022-10-23 | Disposition: A | Discharge status: Home / Self Care | Payer: Medicare HMO | Source: Ambulatory Visit | Attending: Cardiology | Admitting: Cardiology

## 2022-10-23 ENCOUNTER — Ambulatory Visit (HOSPITAL_COMMUNITY): Payer: Self-pay | Admitting: Pharmacist

## 2022-10-23 VITALS — BP 110/0 | HR 86 | Ht 70.0 in | Wt 200.4 lb

## 2022-10-23 DIAGNOSIS — M79641 Pain in right hand: Secondary | ICD-10-CM | POA: Insufficient documentation

## 2022-10-23 DIAGNOSIS — I5042 Chronic combined systolic (congestive) and diastolic (congestive) heart failure: Secondary | ICD-10-CM | POA: Insufficient documentation

## 2022-10-23 DIAGNOSIS — I132 Hypertensive heart and chronic kidney disease with heart failure and with stage 5 chronic kidney disease, or end stage renal disease: Secondary | ICD-10-CM | POA: Insufficient documentation

## 2022-10-23 DIAGNOSIS — I4892 Unspecified atrial flutter: Secondary | ICD-10-CM | POA: Insufficient documentation

## 2022-10-23 DIAGNOSIS — E1136 Type 2 diabetes mellitus with diabetic cataract: Secondary | ICD-10-CM | POA: Diagnosis not present

## 2022-10-23 DIAGNOSIS — Z79899 Other long term (current) drug therapy: Secondary | ICD-10-CM | POA: Insufficient documentation

## 2022-10-23 DIAGNOSIS — I351 Nonrheumatic aortic (valve) insufficiency: Secondary | ICD-10-CM | POA: Diagnosis not present

## 2022-10-23 DIAGNOSIS — B9561 Methicillin susceptible Staphylococcus aureus infection as the cause of diseases classified elsewhere: Secondary | ICD-10-CM | POA: Insufficient documentation

## 2022-10-23 DIAGNOSIS — Z8616 Personal history of COVID-19: Secondary | ICD-10-CM | POA: Insufficient documentation

## 2022-10-23 DIAGNOSIS — I4891 Unspecified atrial fibrillation: Secondary | ICD-10-CM | POA: Diagnosis not present

## 2022-10-23 DIAGNOSIS — Z992 Dependence on renal dialysis: Secondary | ICD-10-CM | POA: Diagnosis not present

## 2022-10-23 DIAGNOSIS — D631 Anemia in chronic kidney disease: Secondary | ICD-10-CM | POA: Diagnosis not present

## 2022-10-23 DIAGNOSIS — Z951 Presence of aortocoronary bypass graft: Secondary | ICD-10-CM | POA: Diagnosis not present

## 2022-10-23 DIAGNOSIS — E785 Hyperlipidemia, unspecified: Secondary | ICD-10-CM | POA: Diagnosis not present

## 2022-10-23 DIAGNOSIS — Z794 Long term (current) use of insulin: Secondary | ICD-10-CM | POA: Insufficient documentation

## 2022-10-23 DIAGNOSIS — Z95811 Presence of heart assist device: Secondary | ICD-10-CM | POA: Insufficient documentation

## 2022-10-23 DIAGNOSIS — E1122 Type 2 diabetes mellitus with diabetic chronic kidney disease: Secondary | ICD-10-CM | POA: Diagnosis not present

## 2022-10-23 DIAGNOSIS — I255 Ischemic cardiomyopathy: Secondary | ICD-10-CM | POA: Diagnosis not present

## 2022-10-23 DIAGNOSIS — I2510 Atherosclerotic heart disease of native coronary artery without angina pectoris: Secondary | ICD-10-CM | POA: Diagnosis not present

## 2022-10-23 DIAGNOSIS — N186 End stage renal disease: Secondary | ICD-10-CM | POA: Insufficient documentation

## 2022-10-23 DIAGNOSIS — Z7901 Long term (current) use of anticoagulants: Secondary | ICD-10-CM | POA: Diagnosis not present

## 2022-10-23 LAB — PREALBUMIN: Prealbumin: 44 mg/dL — ABNORMAL HIGH (ref 18–38)

## 2022-10-23 LAB — COMPREHENSIVE METABOLIC PANEL
ALT: 19 U/L (ref 0–44)
AST: 33 U/L (ref 15–41)
Albumin: 4.4 g/dL (ref 3.5–5.0)
Alkaline Phosphatase: 104 U/L (ref 38–126)
Anion gap: 18 — ABNORMAL HIGH (ref 5–15)
BUN: 29 mg/dL — ABNORMAL HIGH (ref 8–23)
CO2: 27 mmol/L (ref 22–32)
Calcium: 8.7 mg/dL — ABNORMAL LOW (ref 8.9–10.3)
Chloride: 94 mmol/L — ABNORMAL LOW (ref 98–111)
Creatinine, Ser: 4.61 mg/dL — ABNORMAL HIGH (ref 0.61–1.24)
GFR, Estimated: 14 mL/min — ABNORMAL LOW (ref >60)
Glucose, Bld: 87 mg/dL (ref 70–99)
Potassium: 3.9 mmol/L (ref 3.5–5.1)
Sodium: 139 mmol/L (ref 135–145)
Total Bilirubin: 0.8 mg/dL (ref 0.3–1.2)
Total Protein: 8.4 g/dL — ABNORMAL HIGH (ref 6.5–8.1)

## 2022-10-23 LAB — CBC
HCT: 35.2 % — ABNORMAL LOW (ref 39.0–52.0)
Hemoglobin: 11.2 g/dL — ABNORMAL LOW (ref 13.0–17.0)
MCH: 30.4 pg (ref 26.0–34.0)
MCHC: 31.8 g/dL (ref 30.0–36.0)
MCV: 95.4 fL (ref 80.0–100.0)
Platelets: 179 10*3/uL (ref 150–400)
RBC: 3.69 MIL/uL — ABNORMAL LOW (ref 4.22–5.81)
RDW: 15 % (ref 11.5–15.5)
WBC: 7.8 10*3/uL (ref 4.0–10.5)
nRBC: 0 % (ref 0.0–0.2)

## 2022-10-23 LAB — PROTIME-INR
INR: 1.7 — ABNORMAL HIGH (ref 0.8–1.2)
Prothrombin Time: 19.8 seconds — ABNORMAL HIGH (ref 11.4–15.2)

## 2022-10-23 LAB — LACTATE DEHYDROGENASE: LDH: 222 U/L — ABNORMAL HIGH (ref 98–192)

## 2022-10-23 LAB — ECHOCARDIOGRAM COMPLETE: S' Lateral: 3.4 cm

## 2022-10-23 NOTE — Patient Instructions (Signed)
No medication changes today Coumadin per Lauren PharmD Return to clinic in 2 months for follow up with Dr McLean 

## 2022-10-23 NOTE — Progress Notes (Addendum)
Patient presents for 2 month f/u with annual main and 2 year itnermacs  VAD Clinic today with brother Marcus Johnson). Reports no problems with VAD equipment or concerns with drive line.  Patient arrived via wheelchair today. Pt denies falls, heart failure symptoms, or signs of bleeding.   Patient reports he continues M/W/F dialysis. Pt confirms he takes Midodrine TID on HD days only.   Pt on Keflex 500 mg BID (dosed for HD schedule). Marcus Johnson reports they are changing dressing weekly. Dressing changed today in clinic. Currently wearing 2 anchors. Reports he wears binder at home, but does not like to wear during dialysis. See documentation below for dressing change details.   Pt has tape all around his white lead today from normal wear and tear of equipment. Pt elected to have controller changed today.    HeartMate 3 controller exchange performed at the bedside. We discussed that they should never attempt to do this at home without notifying VAD Pager first to triage need and provide support for the patient's safety as they may incur difficulties with the procedure and impair ability to restart pump. I performed elective controller exchange. New Controller [id number]. Patient tolerated controller exchange well and was asymptomatic. Pump restarted as expected with stable VAD parameters on correct prescribed speed of 5800 / 5500 rpms.     Pt had echocardiogram today.  Vital Signs:  Doppler Pressure: 110 Automatc BP: 117/94 (103) HR: 86 SR O2: 100 % RA   Weight: 200.4 lbs w/ eqt Last wt: 197.6 w/ eqt   VAD Indication: Destination Therapy due to CKD Stage 3b   LVAD assessment:  HM III: Speed: 5800 rpms Flow: 5.2 Power: 4.7 w    PI: 3.1 Alarms: none Events: 10-30  Fixed speed:  5800 Low speed limit:  5500  Primary controller: back up battery due for replacement in 32 months Secondary controller:  back up battery due for replacement in 9 months  I reviewed the LVAD parameters from today  and compared the results to the patient's prior recorded data. LVAD interrogation was NEGATIVE for significant power changes, NEGATIVE for clinical alarms and STABLE for PI events/speed drops. No programming changes were made and pump is functioning within specified parameters. Pt is performing daily controller and system monitor self tests along with completing weekly and monthly maintenance for LVAD equipment.   LVAD equipment check completed and is in good working order. Back-up equipment present.    Annual Equipment Maintenance on UBC/PM was performed 10/23/22.  Exit Site Care: Dressing maintained weekly by pt's brother. Gauze VAD dressing and anchor removed and site care performed using sterile technique. Drive line exit site cleaned with Chlora prep applicators x 2, allowed to dry before gauze dressing NO SILVER STRIP applied. Exit site incorporated. The velour is fully implanted at exit site. No drainage, redness, tenderness, or foul odor noted. Small dried scab at exit site removed with cleansing. Drive line anchor re-applied x 2. Pt denies fever or chills. May continue weekly dressing using daily kits. Pt given 20 anchors for at home use.  Device: Medtronic single Therapies: on 182 bpm Pacing: VVI 40 Last check:  02/28/22   BP & Labs:  Doppler 110 - Doppler is reflecting modified systolic   Hgb 123XX123 - No S/S of bleeding. Specifically denies melena/BRBPR or nosebleeds.   LDH 222 - established baseline of 160 - 180. Denies tea-colored urine. No power elevations noted on interrogation.    Batteries Manufacture Date: Number of uses: Re-calibration  08/30/20 79  Performed by patient   Annual maintenance completed per [employer] on patient's home power module and Electrical engineer.    Backup system controller 11 volt battery charged during visit.   2 year Intermacs follow up completed including:  Quality of Life, KCCQ-12, and Neurocognitive trail making.   Pt unable to complete 6  minute walk due to weakness and being in a wheelchair.   Patient Goals: Continue doing well with dialysis    Patient Instructions: No medication changes today Coumadin per Marcus Johnson PharmD Return to clinic in 2 months for follow up with Dr Aundra Dubin  Tanda Rockers RN, BSN VAD Coordinator 24/7 Pager [phone]  Follow up for Heart Failure/LVAD:  Marcus Johnson. is a 62 y.o. male who has a h/o DM2, CAD s/p CABG 123XX123, systolic HF due to ischemic cardiomyopathy with EF 20-25% (echo 12/15), DM2 and CKD. He is s/p Medtronic single chamber ICD.   Admitted in 12/15 due to ADHF. Required short course milrinone for diuresis. Diuresed 30 pounds.    CPX 2/16 showed severely reduced functional capacity.  There was a significant disconnect between his symptoms and the CPX.  RHC in 6/16 showed fairly normal filling pressures and low but not markedly low cardiac output.    CPX (4/19) was submaximal but suggested severe limitation from HF.    He was admitted in 6/19 with marked volume overload and dyspnea.  Echo in 6/19 showed EF 20% with severe global hypokinesis.  LHC/RHC showed severe native vessel CAD with patent LIMA-D, SVG-OM, and SVG-PLV; cardiac index low.  He was started on milrinone and diuresed.  We discussed LVAD extensively in light of low output, creatinine that is trending up and concern for decompensation of RV over time. He was adamant that he did not want to pursue LVAD workup yet and did not want a referral for transplant evaluation. Milrinone was weaned off and he was discharged home.    Admitted 08/01/18-08/07/18 with volume overload and left foot wound. Diuresed 38 lbs with IV lasix and then transitioned back to torsemide 100 mg BID. ID was consulted with concerns for left foot osteomyelitis noted on CT scan. He got IV ancef q8 hours x 42 days. [facility] provided teaching and supplies for home antibiotics. Course complicated by AKI. DC weight 200 lbs.    He went into atrial flutter  and had DCCV to NSR in 1/20.    Echo in 4/21 showed EF 25% with apical akinesis and diffuse hypokinesis relatively preserved in lateral wall, mildly decreased RV systolic function, PASP 41 mmHg. CPX in 5/21 showed severe functional limitation suggestive of advanced HF. 5/21 Zio patch showed short atrial flutter runs, 3.8% PVCs.    He developed COVID-19 infection in 1/22.  Symptoms were predominantly GI.    He developed progressive worsening of CHF in early 2022 and was admitted in 3/22 with low output HF.  Impella 5.5 was placed as bridge to Heartmate III LVAD placed in 10/13/20.  He developed post-op renal failure and ended up on dialysis.  He was markedly deconditioned post-op and went to CIR.    Still has pain and minimal function in his right hand.  Nerve conduction study showed a chronic sensorimotor neuropathy in the right arm.    Ramp echo in 3/23 showed severe LV dysfunction with mild RV enlargement and mild dysfunction, mild-moderate AI.  Speed was increased to 5800 rpm.  The aortic valve still opened with every beat but only slightly. Flow increased from 4.1 to 5.3  L/min and PI remained stable.   Patient was admitted in 4/23 with falls and lightheadedness. He fractured his left foot when falling and initially was unable to walk.  He was thought to be hypotensive due to over-diuresis and dry weight was increased.  Midodrine on HD days was also increased.  He was treated for a driveline infection with vancomycin then doxycycline, now off.   ICD shock for VT in 7/23, not started on amiodarone given only 1 episode.   Echo was done today and reviewed, EF 20-25% with midline IV septum, mild RV dysfunction with severe RVE, moderate AI.   Patient returns today for followup of CHF/LVAD.  Weight is stable.  MAP mildly elevated today.  He is tolerating HD without lightheadedness, takes midodrine on HD days. He is not short of breath walking on flat ground, doing "pretty good."  He has regained most  use of his right hand. Rarely lightheaded. Stable 10-30 PI events/day on device interrogation.   I reviewed the LVAD parameters from today, and compared the results to the patient's prior recorded data.  No programming changes were made.  The LVAD is functioning within specified parameters.  The patient performs LVAD self-test daily.  LVAD interrogation was negative for any significant power changes, alarms or PI events/speed drops.  LVAD equipment check completed and is in good working order.  Back-up equipment present.   LVAD education done on emergency procedures and precautions and reviewed exit site care.  Labs (5/22): LDH 174, hgb 10.6 Labs (7/22): LDH 164 Labs (8/22): hgb 11.4 Labs (10/22): hgb 12 Labs (11/22): hgb 12.2 Labs (4/23): LDL 163, hgb 10.4 Labs (6/23): hgb 12.2, LDH 284 Labs (7/23): LFTs normal, hgb 11.3, LDH 235 Labs (9/23): hgb 10.2 Labs (1/24): hgb 10.6, LDH 279   Past Medical History:  Diagnosis Date   AICD (automatic cardioverter/defibrillator) present    Single-chamber  implantable cardiac defibrillator - Medtronic   Atrial fibrillation (HCC)    Cataract    Mixed form OU   CHF (congestive heart failure) (HCC)    Chronic kidney disease    Chronic kidney disease (CKD), stage III (moderate) (HCC)    Chronic systolic heart failure ([place])    a. Echo 5/13: Mild LVE, mild LVH, EF 10%, anteroseptal, lateral, apical AK, mild MR, mild LAE, moderately reduced RVSF, mild RAE, PASP 60;  b. 07/2014 Echo: EF 20-2%, diff HK, AKI of antsep/apical/mid-apicalinferior, mod reduced RV.   Coronary artery disease    a. s/p CABG 2002 b. LHC 5/13:  dLM 80%, LAD subtotally occluded, pCFX occluded, pRCA 50%, mid? Occlusion with high take off of the PDA with 70% multiple lesions-not bypassed and supplies collaterals to LAD, LIMA-IM/ramus ok, S-OM ok, S-PLA branches ok. Medical therapy was recommended   COVID    Diabetic retinopathy ([place])    NPDR OD, PDR OS   Dyspnea    Gout    "on daily  RX" (01/08/2018)   Hypertension    Hypertensive retinopathy    OU   Ischemic cardiomyopathy    a. Echo 5/13: Mild LVE, mild LVH, EF 10%, anteroseptal, lateral, apical AK, mild MR, mild LAE, moderately reduced RVSF, mild RAE, PASP 60;  b. 01/2012 s/p MDT [id number] Protecta XT VR AICD;  c. 07/2014 Echo: EF 20-2%, diff HK, AKI of antsep/apical/mid-apicalinferior, mod reduced RV.   MRSA (methicillin resistant Staphylococcus aureus)    Status post right foot plantar deep infection with MRSA status post  I&D 10/2008   Myocardial infarction [facility])    "  was told I'd had several before heart OR in 2002" (01/08/2018)   Noncompliance    Peripheral neuropathy    Retinopathy, diabetic, background ([place])    Syncope    Type II diabetes mellitus ([place])    requiring insulin    Vitreous hemorrhage, left (HCC)    and proliferative diabetic retinopathy    Current Outpatient Medications  Medication Sig Dispense Refill   Blood Glucose Monitoring Suppl (ONE TOUCH ULTRA 2) w/Device KIT 1 Device by Does not apply route 3 (three) times daily. 1 kit 0   cephALEXin (KEFLEX) 500 MG capsule Take 1 capsule (500 mg total) by mouth 2 (two) times daily. Take evening dose after dialysis on HD days. 120 capsule 1   gabapentin (NEURONTIN) 100 MG capsule Take 1-2 capsules (100-200 mg total) by mouth See admin instructions. Taking 1 capsule ( 100 mg) in the AM and 2 capsules ( 200 mg) at bedtime 90 capsule 3   glucose blood (ACCU-CHEK GUIDE) test strip USE AS INSTRUCTED 3 TIMES DAILY 100 strip 1   insulin glargine (LANTUS SOLOSTAR) 100 UNIT/ML Solostar Pen Inject 60 Units into the skin at bedtime. (Patient taking differently: Inject 60 Units into the skin daily as needed.) 30 mL 11   midodrine (PROAMATINE) 10 MG tablet Take 2 tablets (20 mg total) by mouth 3 (three) times daily. Three times a day on dialysis days; NO midodrine on NON dialysis days 135 tablet 11   multivitamin (RENA-VIT) TABS tablet TAKE 1 TABLET BY MOUTH EVERYDAY AT  BEDTIME 90 tablet 3   ondansetron (ZOFRAN) 4 MG tablet Take 1 tablet (4 mg total) by mouth every 8 (eight) hours as needed for nausea or vomiting. 30 tablet 3   OneTouch UltraSoft 2 Lancets MISC 1 each by Does not apply route 3 (three) times daily before meals. 100 each 1   pantoprazole (PROTONIX) 40 MG tablet Take 1 tablet (40 mg total) by mouth daily. 90 tablet 3   rosuvastatin (CRESTOR) 10 MG tablet TAKE 1 TABLET BY MOUTH EVERY DAY 90 tablet 3   sevelamer (RENAGEL) 800 MG tablet Take 800 mg by mouth 3 (three) times daily.     traMADol (ULTRAM) 50 MG tablet Take 1 tablet (50 mg total) by mouth every 12 (twelve) hours as needed. 30 tablet 0   warfarin (COUMADIN) 3 MG tablet TAKE 1.5 TABLET EVERY TUESDAY/SATURDAY AND 1 TAB ALL OTHER DAYS OR AS DIRECTED BY HF CLINIC 103 tablet 6   acetaminophen (TYLENOL) 325 MG tablet Take by mouth. (Patient not taking: Reported on 10/23/2022)     allopurinol (ZYLOPRIM) 300 MG tablet Take by mouth. (Patient not taking: Reported on 10/23/2022)     atorvastatin (LIPITOR) 40 MG tablet Take 1 tablet (40 mg total) by mouth daily. (Patient not taking: Reported on 10/23/2022)     naphazoline-glycerin (CLEAR EYES REDNESS) 0.012-0.25 % SOLN Place 1-2 drops into the right eye 4 (four) times daily as needed for eye irritation. (Patient not taking: Reported on 10/23/2022)     polyethylene glycol powder (GLYCOLAX/MIRALAX) 17 GM/SCOOP powder Glycolax/Miralax 17 gm (Patient not taking: Reported on 10/23/2022)     No current facility-administered medications for this encounter.    Meclizine hcl and Ivabradine  REVIEW OF SYSTEMS: All systems negative except as listed in HPI, PMH and Problem list.   LVAD INTERROGATION:  Please see LVAD nurse's note above.   BP (!) 110/0   Pulse 86   Ht 5\' 10"  (1.778 m)   Wt 90.9 kg (200 lb  6.4 oz)   SpO2 100%   BMI 28.75 kg/m  MAP 103  Physical Exam: General: Well appearing this am. NAD.  HEENT: Normal. Neck: Supple, JVP 7-8 cm.  Carotids OK.  Cardiac:  Mechanical heart sounds with LVAD hum present.  Lungs:  CTAB, normal effort.  Abdomen:  NT, ND, no HSM. No bruits or masses. +BS  LVAD exit site: Well-healed and incorporated. Dressing dry and intact. No erythema or drainage. Stabilization device present and accurately applied. Driveline dressing changed daily per sterile technique. Extremities:  Warm and dry. No cyanosis, clubbing, rash, or edema.  Neuro:  Alert & oriented x 3. Cranial nerves grossly intact. Moves all 4 extremities w/o difficulty. Affect pleasant    ASSESSMENT AND PLAN:  1. Chronic systolic CHF:  Ischemic cardiomyopathy. Has Medtronic ICD. Low output HF, admitted and Impella 5.5 placed 10/04/20.  HM3 LVAD was placed on 10/13/20. Developed post-op renal failure requiring eventual hemodialysis.  Ramp echo in 3/23, speed increased to 5800 rpm under echo guidance with increase in flow and stable PI.  Echo done today showed EF 20-25% with midline IV septum, mild RV dysfunction with severe RVE, moderate AI. MAP mildly elevated today but drops on HD days without midodrine.  He is not volume overloaded.  - Continue midodrine 20 mg tid on HD days.   - Controller changed out today due to wear and tear.  2. LVAD:  VAD interrogated personally.  Parameters stable with PI events on HD days.  - He is now off ASA.  - Continue warfarin with INR goal 2-2.5.   3. CAD s/p CABG 2002:  Last cath in 6/19 with patent grafts. No chest pain.  - Continue Crestor.  4. ESRD: Currently dialyzing via tunneled catheter. No low flow events.  - Continue HD M-W-F  - With continuous flow LVAD, think AV fistula would be unlikely to mature.   5. Atrial flutter/fibrillation: s/p DC-CV 08/21/18.  Has occasional episodes of AF but not prolonged.  6. 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 This has been improving, his grip is significantly better today.  7. DM: Per primary  care.  8. Anemia: CBC today.  9. Suspected Gastroparesis: He is no longer taking Reglan.  10. VT: Episode in 7/23 with successful ICD discharge.  No definite trigger, he did not actually feel the VT. No further episodes.  - As this was his first episode and there has been no recurrence, we did not start amiodarone.  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 Driveline site looks good today.   Loralie Champagne 10/23/2022

## 2022-10-24 DIAGNOSIS — Z992 Dependence on renal dialysis: Secondary | ICD-10-CM | POA: Diagnosis not present

## 2022-10-24 DIAGNOSIS — N2581 Secondary hyperparathyroidism of renal origin: Secondary | ICD-10-CM | POA: Diagnosis not present

## 2022-10-24 DIAGNOSIS — N186 End stage renal disease: Secondary | ICD-10-CM | POA: Diagnosis not present

## 2022-10-26 DIAGNOSIS — N2581 Secondary hyperparathyroidism of renal origin: Secondary | ICD-10-CM | POA: Diagnosis not present

## 2022-10-26 DIAGNOSIS — N186 End stage renal disease: Secondary | ICD-10-CM | POA: Diagnosis not present

## 2022-10-26 DIAGNOSIS — Z992 Dependence on renal dialysis: Secondary | ICD-10-CM | POA: Diagnosis not present

## 2022-10-29 DIAGNOSIS — Z992 Dependence on renal dialysis: Secondary | ICD-10-CM | POA: Diagnosis not present

## 2022-10-29 DIAGNOSIS — N2581 Secondary hyperparathyroidism of renal origin: Secondary | ICD-10-CM | POA: Diagnosis not present

## 2022-10-29 DIAGNOSIS — N186 End stage renal disease: Secondary | ICD-10-CM | POA: Diagnosis not present

## 2022-10-30 ENCOUNTER — Ambulatory Visit (HOSPITAL_COMMUNITY): Payer: Self-pay | Admitting: Pharmacist

## 2022-10-30 LAB — POCT INR: INR: 2.1 (ref 2.0–3.0)

## 2022-10-31 DIAGNOSIS — N2581 Secondary hyperparathyroidism of renal origin: Secondary | ICD-10-CM | POA: Diagnosis not present

## 2022-10-31 DIAGNOSIS — N186 End stage renal disease: Secondary | ICD-10-CM | POA: Diagnosis not present

## 2022-10-31 DIAGNOSIS — Z992 Dependence on renal dialysis: Secondary | ICD-10-CM | POA: Diagnosis not present

## 2022-11-02 DIAGNOSIS — Z992 Dependence on renal dialysis: Secondary | ICD-10-CM | POA: Diagnosis not present

## 2022-11-02 DIAGNOSIS — N186 End stage renal disease: Secondary | ICD-10-CM | POA: Diagnosis not present

## 2022-11-02 DIAGNOSIS — N2581 Secondary hyperparathyroidism of renal origin: Secondary | ICD-10-CM | POA: Diagnosis not present

## 2022-11-04 DIAGNOSIS — N186 End stage renal disease: Secondary | ICD-10-CM | POA: Diagnosis not present

## 2022-11-04 DIAGNOSIS — Z992 Dependence on renal dialysis: Secondary | ICD-10-CM | POA: Diagnosis not present

## 2022-11-04 DIAGNOSIS — I509 Heart failure, unspecified: Secondary | ICD-10-CM | POA: Diagnosis not present

## 2022-11-05 DIAGNOSIS — N186 End stage renal disease: Secondary | ICD-10-CM | POA: Diagnosis not present

## 2022-11-05 DIAGNOSIS — Z992 Dependence on renal dialysis: Secondary | ICD-10-CM | POA: Diagnosis not present

## 2022-11-05 DIAGNOSIS — N2581 Secondary hyperparathyroidism of renal origin: Secondary | ICD-10-CM | POA: Diagnosis not present

## 2022-11-06 ENCOUNTER — Ambulatory Visit (HOSPITAL_COMMUNITY): Payer: Self-pay | Admitting: Pharmacist

## 2022-11-06 DIAGNOSIS — Z7901 Long term (current) use of anticoagulants: Secondary | ICD-10-CM | POA: Diagnosis not present

## 2022-11-06 LAB — POCT INR: INR: 2.1 (ref 2.0–3.0)

## 2022-11-07 DIAGNOSIS — N186 End stage renal disease: Secondary | ICD-10-CM | POA: Diagnosis not present

## 2022-11-07 DIAGNOSIS — Z992 Dependence on renal dialysis: Secondary | ICD-10-CM | POA: Diagnosis not present

## 2022-11-07 DIAGNOSIS — N2581 Secondary hyperparathyroidism of renal origin: Secondary | ICD-10-CM | POA: Diagnosis not present

## 2022-11-09 DIAGNOSIS — Z992 Dependence on renal dialysis: Secondary | ICD-10-CM | POA: Diagnosis not present

## 2022-11-09 DIAGNOSIS — N2581 Secondary hyperparathyroidism of renal origin: Secondary | ICD-10-CM | POA: Diagnosis not present

## 2022-11-09 DIAGNOSIS — N186 End stage renal disease: Secondary | ICD-10-CM | POA: Diagnosis not present

## 2022-11-12 DIAGNOSIS — Z992 Dependence on renal dialysis: Secondary | ICD-10-CM | POA: Diagnosis not present

## 2022-11-12 DIAGNOSIS — N2581 Secondary hyperparathyroidism of renal origin: Secondary | ICD-10-CM | POA: Diagnosis not present

## 2022-11-12 DIAGNOSIS — N186 End stage renal disease: Secondary | ICD-10-CM | POA: Diagnosis not present

## 2022-11-13 ENCOUNTER — Ambulatory Visit (HOSPITAL_COMMUNITY): Payer: Self-pay

## 2022-11-13 LAB — POCT INR: INR: 2.3 (ref 2.0–3.0)
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
## 2022-11-14 DIAGNOSIS — N186 End stage renal disease: Secondary | ICD-10-CM | POA: Diagnosis not present

## 2022-11-14 DIAGNOSIS — N2581 Secondary hyperparathyroidism of renal origin: Secondary | ICD-10-CM | POA: Diagnosis not present

## 2022-11-14 DIAGNOSIS — Z992 Dependence on renal dialysis: Secondary | ICD-10-CM | POA: Diagnosis not present

## 2022-11-16 DIAGNOSIS — Z992 Dependence on renal dialysis: Secondary | ICD-10-CM | POA: Diagnosis not present

## 2022-11-16 DIAGNOSIS — N2581 Secondary hyperparathyroidism of renal origin: Secondary | ICD-10-CM | POA: Diagnosis not present

## 2022-11-16 DIAGNOSIS — N186 End stage renal disease: Secondary | ICD-10-CM | POA: Diagnosis not present

## 2022-11-19 DIAGNOSIS — N2581 Secondary hyperparathyroidism of renal origin: Secondary | ICD-10-CM | POA: Diagnosis not present

## 2022-11-19 DIAGNOSIS — N186 End stage renal disease: Secondary | ICD-10-CM | POA: Diagnosis not present

## 2022-11-19 DIAGNOSIS — Z992 Dependence on renal dialysis: Secondary | ICD-10-CM | POA: Diagnosis not present

## 2022-11-19 NOTE — Progress Notes (Signed)
Remote ICD transmission.   

## 2022-11-20 ENCOUNTER — Ambulatory Visit (HOSPITAL_COMMUNITY): Payer: Self-pay

## 2022-11-20 LAB — POCT INR: INR: 2.1 (ref 2.0–3.0)

## 2022-11-21 DIAGNOSIS — N2581 Secondary hyperparathyroidism of renal origin: Secondary | ICD-10-CM | POA: Diagnosis not present

## 2022-11-21 DIAGNOSIS — Z992 Dependence on renal dialysis: Secondary | ICD-10-CM | POA: Diagnosis not present

## 2022-11-21 DIAGNOSIS — N186 End stage renal disease: Secondary | ICD-10-CM | POA: Diagnosis not present

## 2022-11-23 DIAGNOSIS — Z992 Dependence on renal dialysis: Secondary | ICD-10-CM | POA: Diagnosis not present

## 2022-11-23 DIAGNOSIS — N2581 Secondary hyperparathyroidism of renal origin: Secondary | ICD-10-CM | POA: Diagnosis not present

## 2022-11-23 DIAGNOSIS — N186 End stage renal disease: Secondary | ICD-10-CM | POA: Diagnosis not present

## 2022-11-26 DIAGNOSIS — Z992 Dependence on renal dialysis: Secondary | ICD-10-CM | POA: Diagnosis not present

## 2022-11-26 DIAGNOSIS — N186 End stage renal disease: Secondary | ICD-10-CM | POA: Diagnosis not present

## 2022-11-26 DIAGNOSIS — N2581 Secondary hyperparathyroidism of renal origin: Secondary | ICD-10-CM | POA: Diagnosis not present

## 2022-11-27 ENCOUNTER — Encounter (INDEPENDENT_AMBULATORY_CARE_PROVIDER_SITE_OTHER): Payer: Self-pay | Admitting: Ophthalmology

## 2022-11-27 ENCOUNTER — Ambulatory Visit (HOSPITAL_COMMUNITY): Payer: Self-pay

## 2022-11-27 ENCOUNTER — Ambulatory Visit (INDEPENDENT_AMBULATORY_CARE_PROVIDER_SITE_OTHER): Payer: Medicare HMO | Admitting: Ophthalmology

## 2022-11-27 DIAGNOSIS — E113512 Type 2 diabetes mellitus with proliferative diabetic retinopathy with macular edema, left eye: Secondary | ICD-10-CM

## 2022-11-27 DIAGNOSIS — H35033 Hypertensive retinopathy, bilateral: Secondary | ICD-10-CM | POA: Diagnosis not present

## 2022-11-27 DIAGNOSIS — H25813 Combined forms of age-related cataract, bilateral: Secondary | ICD-10-CM | POA: Diagnosis not present

## 2022-11-27 DIAGNOSIS — H4312 Vitreous hemorrhage, left eye: Secondary | ICD-10-CM | POA: Diagnosis not present

## 2022-11-27 DIAGNOSIS — Z794 Long term (current) use of insulin: Secondary | ICD-10-CM | POA: Diagnosis not present

## 2022-11-27 DIAGNOSIS — I10 Essential (primary) hypertension: Secondary | ICD-10-CM

## 2022-11-27 DIAGNOSIS — E113513 Type 2 diabetes mellitus with proliferative diabetic retinopathy with macular edema, bilateral: Secondary | ICD-10-CM | POA: Diagnosis not present

## 2022-11-27 LAB — POCT INR: INR: 2.4 (ref 2.0–3.0)

## 2022-11-27 MED ORDER — BEVACIZUMAB CHEMO INJECTION 1.25MG/0.05ML SYRINGE FOR KALEIDOSCOPE
1.2500 mg | INTRAVITREAL | Status: AC | PRN
  Administered 2022-11-27: 1.25 mg via INTRAVITREAL

## 2022-11-28 DIAGNOSIS — N2581 Secondary hyperparathyroidism of renal origin: Secondary | ICD-10-CM | POA: Diagnosis not present

## 2022-11-28 DIAGNOSIS — N186 End stage renal disease: Secondary | ICD-10-CM | POA: Diagnosis not present

## 2022-11-28 DIAGNOSIS — Z992 Dependence on renal dialysis: Secondary | ICD-10-CM | POA: Diagnosis not present

## 2022-11-30 DIAGNOSIS — N2581 Secondary hyperparathyroidism of renal origin: Secondary | ICD-10-CM | POA: Diagnosis not present

## 2022-11-30 DIAGNOSIS — N186 End stage renal disease: Secondary | ICD-10-CM | POA: Diagnosis not present

## 2022-11-30 DIAGNOSIS — Z992 Dependence on renal dialysis: Secondary | ICD-10-CM | POA: Diagnosis not present

## 2022-11-30 IMAGING — CR DG CHEST 2V
2 series · 2 of 2 positions shown · non-contrast
Comparison: None.

CLINICAL DATA: Short of breath

EXAM:
CHEST - 2 VIEW

[chest lat]
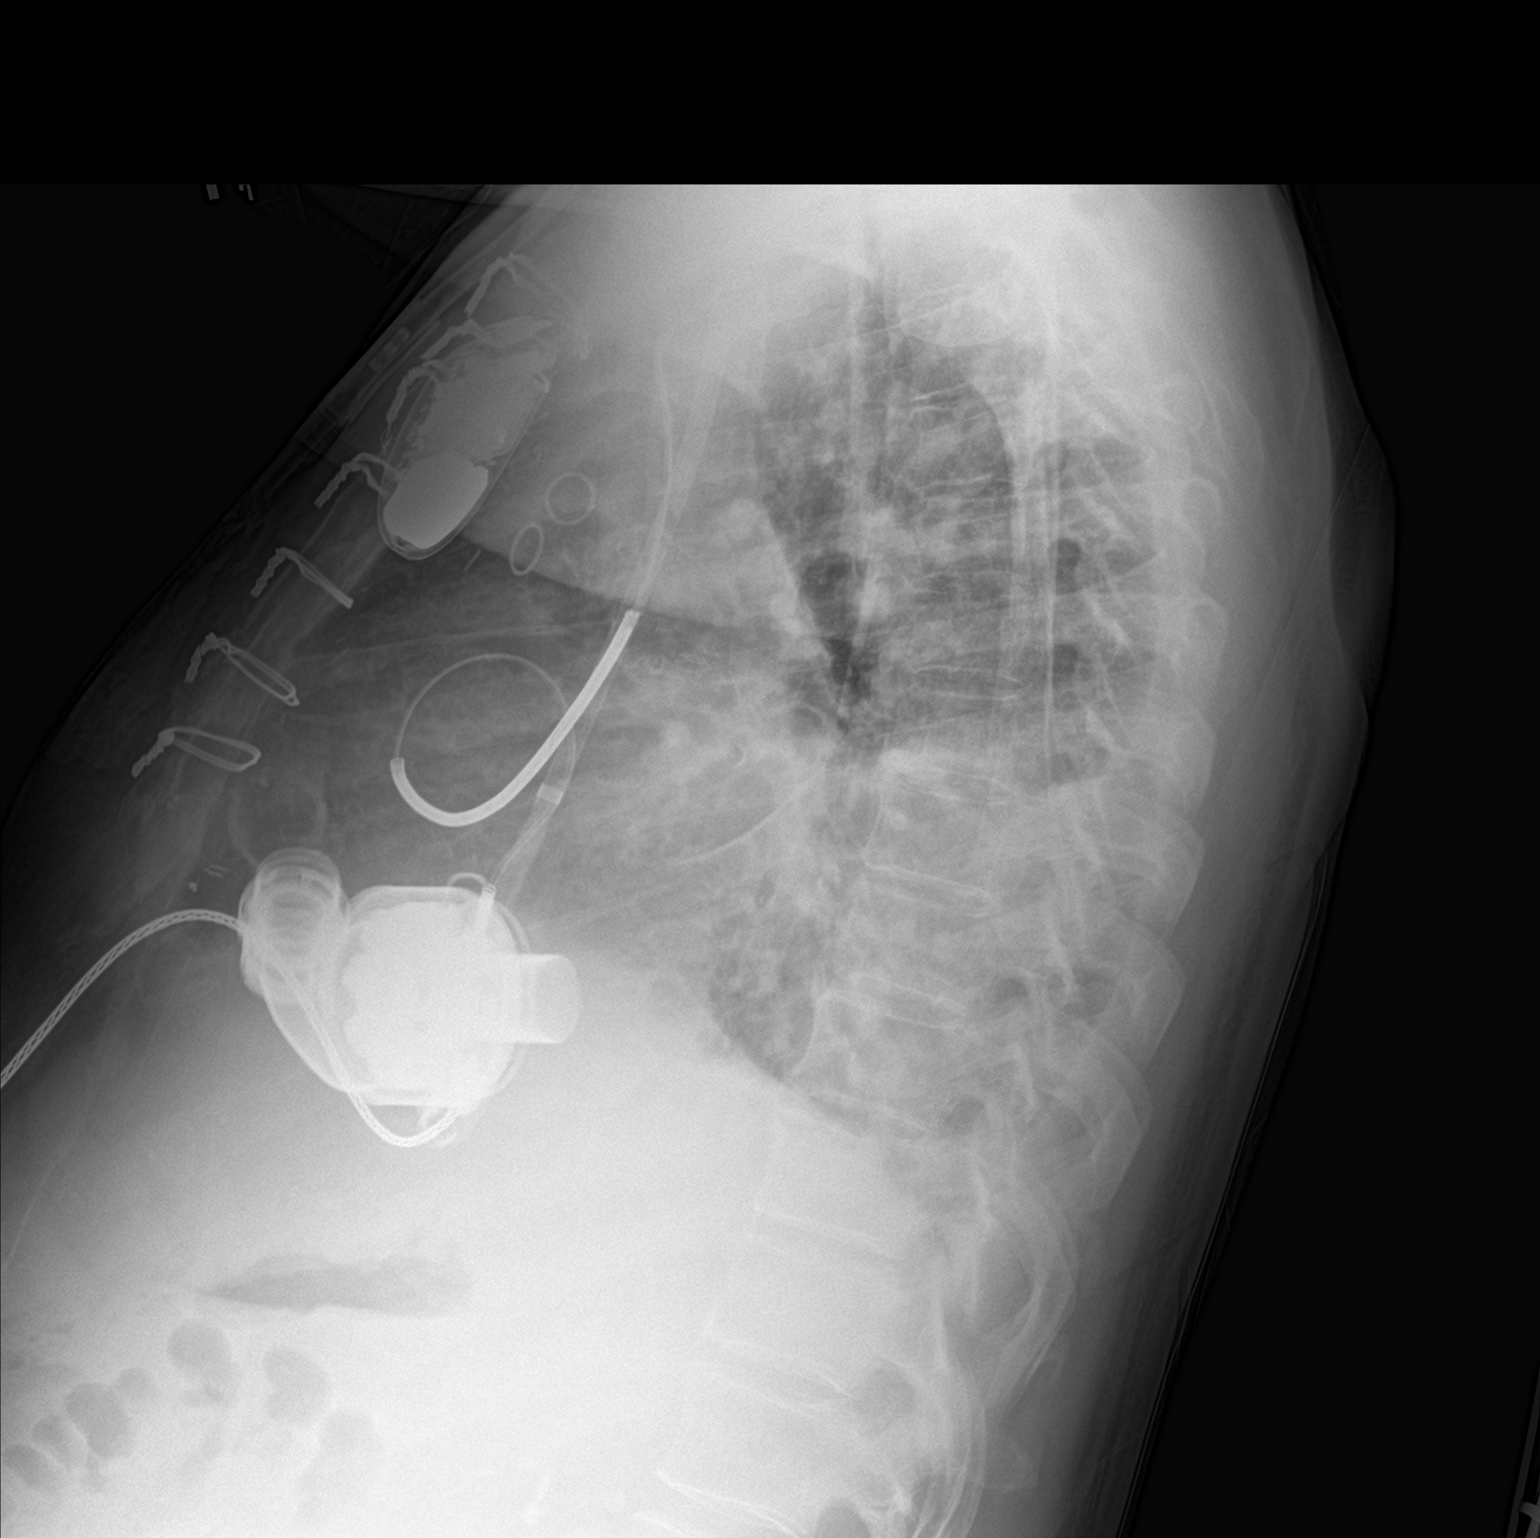

[chest ap]
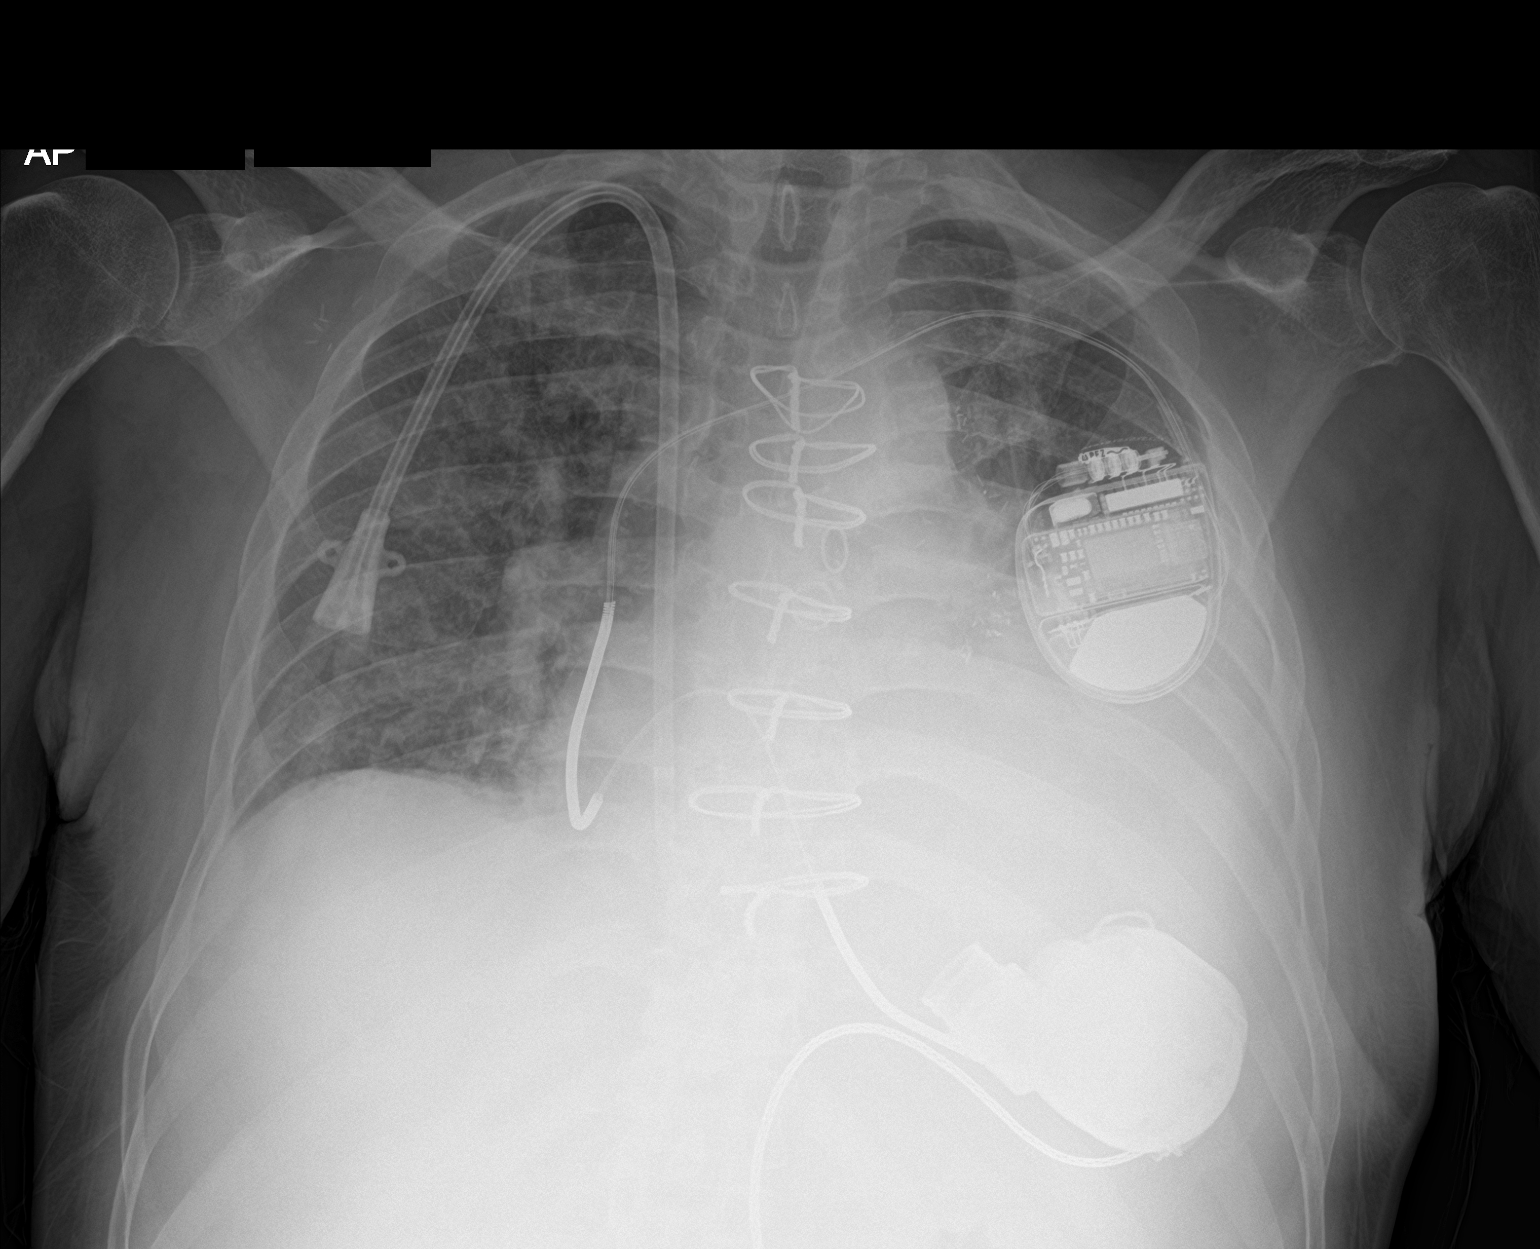

[2 of 2 positions shown; findings below may reference images not displayed]

FINDINGS: LVAD device and LEFT pacer noted. Large-bore central venous line
unchanged. Large LEFT effusion is increased in volume compared to
[DATE] mild fine airspace disease pattern suggest mild
pulmonary edema.
IMPRESSION: 1. Increasing LEFT effusion.
2. Mild pulmonary edema pattern.

## 2022-12-03 DIAGNOSIS — Z992 Dependence on renal dialysis: Secondary | ICD-10-CM | POA: Diagnosis not present

## 2022-12-03 DIAGNOSIS — N186 End stage renal disease: Secondary | ICD-10-CM | POA: Diagnosis not present

## 2022-12-03 DIAGNOSIS — N2581 Secondary hyperparathyroidism of renal origin: Secondary | ICD-10-CM | POA: Diagnosis not present

## 2022-12-04 ENCOUNTER — Ambulatory Visit (HOSPITAL_COMMUNITY): Payer: Self-pay

## 2022-12-04 DIAGNOSIS — I509 Heart failure, unspecified: Secondary | ICD-10-CM | POA: Diagnosis not present

## 2022-12-04 DIAGNOSIS — Z992 Dependence on renal dialysis: Secondary | ICD-10-CM | POA: Diagnosis not present

## 2022-12-04 DIAGNOSIS — Z7901 Long term (current) use of anticoagulants: Secondary | ICD-10-CM | POA: Diagnosis not present

## 2022-12-04 DIAGNOSIS — N186 End stage renal disease: Secondary | ICD-10-CM | POA: Diagnosis not present

## 2022-12-04 LAB — POCT INR: INR: 2.3 (ref 2.0–3.0)

## 2022-12-05 DIAGNOSIS — N2581 Secondary hyperparathyroidism of renal origin: Secondary | ICD-10-CM | POA: Diagnosis not present

## 2022-12-05 DIAGNOSIS — Z992 Dependence on renal dialysis: Secondary | ICD-10-CM | POA: Diagnosis not present

## 2022-12-05 DIAGNOSIS — N186 End stage renal disease: Secondary | ICD-10-CM | POA: Diagnosis not present

## 2022-12-07 DIAGNOSIS — N2581 Secondary hyperparathyroidism of renal origin: Secondary | ICD-10-CM | POA: Diagnosis not present

## 2022-12-07 DIAGNOSIS — N186 End stage renal disease: Secondary | ICD-10-CM | POA: Diagnosis not present

## 2022-12-07 DIAGNOSIS — Z992 Dependence on renal dialysis: Secondary | ICD-10-CM | POA: Diagnosis not present

## 2022-12-10 DIAGNOSIS — N186 End stage renal disease: Secondary | ICD-10-CM | POA: Diagnosis not present

## 2022-12-10 DIAGNOSIS — Z992 Dependence on renal dialysis: Secondary | ICD-10-CM | POA: Diagnosis not present

## 2022-12-10 DIAGNOSIS — N2581 Secondary hyperparathyroidism of renal origin: Secondary | ICD-10-CM | POA: Diagnosis not present
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
## 2022-12-11 ENCOUNTER — Encounter (INDEPENDENT_AMBULATORY_CARE_PROVIDER_SITE_OTHER): Payer: Self-pay | Admitting: Ophthalmology

## 2022-12-11 ENCOUNTER — Ambulatory Visit (HOSPITAL_COMMUNITY): Payer: Self-pay | Admitting: Pharmacist

## 2022-12-11 ENCOUNTER — Ambulatory Visit (INDEPENDENT_AMBULATORY_CARE_PROVIDER_SITE_OTHER): Payer: Medicare HMO | Admitting: Ophthalmology

## 2022-12-11 DIAGNOSIS — Z794 Long term (current) use of insulin: Secondary | ICD-10-CM

## 2022-12-11 DIAGNOSIS — E113513 Type 2 diabetes mellitus with proliferative diabetic retinopathy with macular edema, bilateral: Secondary | ICD-10-CM | POA: Diagnosis not present

## 2022-12-11 DIAGNOSIS — H35033 Hypertensive retinopathy, bilateral: Secondary | ICD-10-CM

## 2022-12-11 DIAGNOSIS — H25813 Combined forms of age-related cataract, bilateral: Secondary | ICD-10-CM

## 2022-12-11 DIAGNOSIS — H4312 Vitreous hemorrhage, left eye: Secondary | ICD-10-CM | POA: Diagnosis not present

## 2022-12-11 DIAGNOSIS — I10 Essential (primary) hypertension: Secondary | ICD-10-CM | POA: Diagnosis not present

## 2022-12-11 LAB — POCT INR: INR: 1.9 — AB (ref 2.0–3.0)

## 2022-12-11 MED ORDER — PREDNISOLONE ACETATE 1 % OP SUSP: 1.0000 [drp] | Freq: Four times a day (QID) | OPHTHALMIC | 0 refills | Status: AC

## 2022-12-12 DIAGNOSIS — Z992 Dependence on renal dialysis: Secondary | ICD-10-CM | POA: Diagnosis not present

## 2022-12-12 DIAGNOSIS — N186 End stage renal disease: Secondary | ICD-10-CM | POA: Diagnosis not present

## 2022-12-12 DIAGNOSIS — N2581 Secondary hyperparathyroidism of renal origin: Secondary | ICD-10-CM | POA: Diagnosis not present
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
## 2022-12-14 DIAGNOSIS — Z992 Dependence on renal dialysis: Secondary | ICD-10-CM | POA: Diagnosis not present

## 2022-12-14 DIAGNOSIS — N186 End stage renal disease: Secondary | ICD-10-CM | POA: Diagnosis not present

## 2022-12-14 DIAGNOSIS — N2581 Secondary hyperparathyroidism of renal origin: Secondary | ICD-10-CM | POA: Diagnosis not present

## 2022-12-17 ENCOUNTER — Other Ambulatory Visit (HOSPITAL_COMMUNITY): Payer: Self-pay | Admitting: Unknown Physician Specialty

## 2022-12-17 DIAGNOSIS — I5042 Chronic combined systolic (congestive) and diastolic (congestive) heart failure: Secondary | ICD-10-CM

## 2022-12-17 DIAGNOSIS — N186 End stage renal disease: Secondary | ICD-10-CM | POA: Diagnosis not present

## 2022-12-17 DIAGNOSIS — N1832 Chronic kidney disease, stage 3b: Secondary | ICD-10-CM

## 2022-12-17 DIAGNOSIS — N2581 Secondary hyperparathyroidism of renal origin: Secondary | ICD-10-CM | POA: Diagnosis not present

## 2022-12-17 DIAGNOSIS — Z992 Dependence on renal dialysis: Secondary | ICD-10-CM | POA: Diagnosis not present

## 2022-12-17 DIAGNOSIS — Z95811 Presence of heart assist device: Secondary | ICD-10-CM

## 2022-12-17 MED ORDER — MIDODRINE HCL 10 MG PO TABS: 20.0000 mg | ORAL_TABLET | Freq: Three times a day (TID) | ORAL | 11 refills | Status: DC

## 2022-12-18 ENCOUNTER — Ambulatory Visit (HOSPITAL_COMMUNITY): Payer: Self-pay | Admitting: Pharmacist

## 2022-12-18 LAB — POCT INR: INR: 2.2 (ref 2.0–3.0)

## 2022-12-19 DIAGNOSIS — Z992 Dependence on renal dialysis: Secondary | ICD-10-CM | POA: Diagnosis not present

## 2022-12-19 DIAGNOSIS — N186 End stage renal disease: Secondary | ICD-10-CM | POA: Diagnosis not present

## 2022-12-19 DIAGNOSIS — N2581 Secondary hyperparathyroidism of renal origin: Secondary | ICD-10-CM | POA: Diagnosis not present

## 2022-12-21 ENCOUNTER — Other Ambulatory Visit (HOSPITAL_COMMUNITY): Payer: Self-pay | Admitting: Cardiology

## 2022-12-21 ENCOUNTER — Other Ambulatory Visit: Payer: Self-pay | Admitting: Family Medicine

## 2022-12-21 DIAGNOSIS — Z992 Dependence on renal dialysis: Secondary | ICD-10-CM | POA: Diagnosis not present

## 2022-12-21 DIAGNOSIS — N186 End stage renal disease: Secondary | ICD-10-CM | POA: Diagnosis not present

## 2022-12-21 DIAGNOSIS — Z7901 Long term (current) use of anticoagulants: Secondary | ICD-10-CM

## 2022-12-21 DIAGNOSIS — K3184 Gastroparesis: Secondary | ICD-10-CM

## 2022-12-21 DIAGNOSIS — N2581 Secondary hyperparathyroidism of renal origin: Secondary | ICD-10-CM | POA: Diagnosis not present

## 2022-12-21 DIAGNOSIS — Z95811 Presence of heart assist device: Secondary | ICD-10-CM

## 2022-12-21 DIAGNOSIS — I951 Orthostatic hypotension: Secondary | ICD-10-CM

## 2022-12-24 DIAGNOSIS — Z992 Dependence on renal dialysis: Secondary | ICD-10-CM | POA: Diagnosis not present

## 2022-12-24 DIAGNOSIS — N2581 Secondary hyperparathyroidism of renal origin: Secondary | ICD-10-CM | POA: Diagnosis not present

## 2022-12-24 DIAGNOSIS — N186 End stage renal disease: Secondary | ICD-10-CM | POA: Diagnosis not present

## 2022-12-25 ENCOUNTER — Ambulatory Visit (INDEPENDENT_AMBULATORY_CARE_PROVIDER_SITE_OTHER): Payer: Medicare HMO | Admitting: Ophthalmology

## 2022-12-25 ENCOUNTER — Encounter (INDEPENDENT_AMBULATORY_CARE_PROVIDER_SITE_OTHER): Payer: Self-pay | Admitting: Ophthalmology

## 2022-12-25 ENCOUNTER — Ambulatory Visit (HOSPITAL_COMMUNITY): Payer: Self-pay | Admitting: Pharmacist

## 2022-12-25 DIAGNOSIS — I10 Essential (primary) hypertension: Secondary | ICD-10-CM | POA: Diagnosis not present

## 2022-12-25 DIAGNOSIS — Z794 Long term (current) use of insulin: Secondary | ICD-10-CM | POA: Diagnosis not present

## 2022-12-25 DIAGNOSIS — H25813 Combined forms of age-related cataract, bilateral: Secondary | ICD-10-CM | POA: Diagnosis not present

## 2022-12-25 DIAGNOSIS — H4312 Vitreous hemorrhage, left eye: Secondary | ICD-10-CM | POA: Diagnosis not present

## 2022-12-25 DIAGNOSIS — E113513 Type 2 diabetes mellitus with proliferative diabetic retinopathy with macular edema, bilateral: Secondary | ICD-10-CM | POA: Diagnosis not present

## 2022-12-25 DIAGNOSIS — H35033 Hypertensive retinopathy, bilateral: Secondary | ICD-10-CM | POA: Diagnosis not present

## 2022-12-25 LAB — POCT INR: INR: 2.1 (ref 2.0–3.0)

## 2022-12-25 MED ORDER — BEVACIZUMAB CHEMO INJECTION 1.25MG/0.05ML SYRINGE FOR KALEIDOSCOPE
1.2500 mg | INTRAVITREAL | Status: AC | PRN
  Administered 2022-12-25: 1.25 mg via INTRAVITREAL

## 2022-12-26 DIAGNOSIS — Z992 Dependence on renal dialysis: Secondary | ICD-10-CM | POA: Diagnosis not present

## 2022-12-26 DIAGNOSIS — N2581 Secondary hyperparathyroidism of renal origin: Secondary | ICD-10-CM | POA: Diagnosis not present

## 2022-12-26 DIAGNOSIS — N186 End stage renal disease: Secondary | ICD-10-CM | POA: Diagnosis not present

## 2022-12-27 ENCOUNTER — Ambulatory Visit (HOSPITAL_COMMUNITY)
Admission: RE | Admit: 2022-12-27 | Discharge: 2022-12-27 | Disposition: A | Discharge status: Home / Self Care | Payer: Medicare HMO | Source: Ambulatory Visit | Attending: Cardiology | Admitting: Cardiology

## 2022-12-27 ENCOUNTER — Encounter (HOSPITAL_COMMUNITY): Payer: Self-pay | Admitting: Cardiology

## 2022-12-27 VITALS — BP 104/0 | HR 98 | Wt 198.4 lb

## 2022-12-27 DIAGNOSIS — I4892 Unspecified atrial flutter: Secondary | ICD-10-CM | POA: Diagnosis not present

## 2022-12-27 DIAGNOSIS — Z992 Dependence on renal dialysis: Secondary | ICD-10-CM | POA: Insufficient documentation

## 2022-12-27 DIAGNOSIS — Z9889 Other specified postprocedural states: Secondary | ICD-10-CM | POA: Diagnosis not present

## 2022-12-27 DIAGNOSIS — Z95811 Presence of heart assist device: Secondary | ICD-10-CM | POA: Diagnosis not present

## 2022-12-27 DIAGNOSIS — I4891 Unspecified atrial fibrillation: Secondary | ICD-10-CM | POA: Diagnosis not present

## 2022-12-27 DIAGNOSIS — Z7901 Long term (current) use of anticoagulants: Secondary | ICD-10-CM | POA: Insufficient documentation

## 2022-12-27 DIAGNOSIS — Z8616 Personal history of COVID-19: Secondary | ICD-10-CM | POA: Insufficient documentation

## 2022-12-27 DIAGNOSIS — I5042 Chronic combined systolic (congestive) and diastolic (congestive) heart failure: Secondary | ICD-10-CM | POA: Diagnosis not present

## 2022-12-27 DIAGNOSIS — D649 Anemia, unspecified: Secondary | ICD-10-CM | POA: Insufficient documentation

## 2022-12-27 DIAGNOSIS — N186 End stage renal disease: Secondary | ICD-10-CM | POA: Diagnosis not present

## 2022-12-27 DIAGNOSIS — I132 Hypertensive heart and chronic kidney disease with heart failure and with stage 5 chronic kidney disease, or end stage renal disease: Secondary | ICD-10-CM | POA: Diagnosis not present

## 2022-12-27 DIAGNOSIS — E1122 Type 2 diabetes mellitus with diabetic chronic kidney disease: Secondary | ICD-10-CM | POA: Insufficient documentation

## 2022-12-27 DIAGNOSIS — Z95810 Presence of automatic (implantable) cardiac defibrillator: Secondary | ICD-10-CM | POA: Insufficient documentation

## 2022-12-27 DIAGNOSIS — I255 Ischemic cardiomyopathy: Secondary | ICD-10-CM | POA: Insufficient documentation

## 2022-12-27 DIAGNOSIS — I5022 Chronic systolic (congestive) heart failure: Secondary | ICD-10-CM | POA: Insufficient documentation

## 2022-12-27 DIAGNOSIS — I2510 Atherosclerotic heart disease of native coronary artery without angina pectoris: Secondary | ICD-10-CM | POA: Insufficient documentation

## 2022-12-27 DIAGNOSIS — Z79899 Other long term (current) drug therapy: Secondary | ICD-10-CM | POA: Diagnosis not present

## 2022-12-27 NOTE — Addendum Note (Signed)
Encounter addended by: Flora Lipps, RN on: 12/27/2022 2:44 PM  Actions taken: Clinical Note Signed

## 2022-12-27 NOTE — Addendum Note (Signed)
Encounter addended by: Flora Lipps, RN on: 12/27/2022 10:49 AM  Actions taken: Pend clinical note

## 2022-12-27 NOTE — Patient Instructions (Signed)
No medication changes today Take Zofran as needed for nausea with HD Return to [facility] in 2 months to see Dr. Shirlee Latch

## 2022-12-27 NOTE — Progress Notes (Addendum)
Patient presents for 2 month f/u VAD Clinic today with brother [name]). Reports no problems with VAD equipment or concerns with drive line.  Patient arrived via wheelchair today but was able to walk to clinic room unassisted. Pt denies falls, heart failure symptoms, or signs of bleeding. Complains of nausea and fatigue on dialysis days. States he is currently not using Zofran to help alleviate symptoms. Dicussed with Dr. Shirlee Latch. Pt encouraged to take Zofran after dialysis to see if symptoms improve.  Patient reports he continues M/W/F dialysis. Pt confirms he takes Midodrine TID on HD days only.   Pt on Keflex 500 mg BID (dosed for HD schedule). [name] reports they are changing dressing weekly. Dressing changed today in clinic. Currently wearing 2 anchors. Reports he wears binder at home, but does not like to wear during dialysis. See documentation below for dressing change details.     Pt states he is scheduled for cataract surgery with Dr. Maxwell Caul at [facility]. Unfortunately this site not equipped to managed VAD under conscious sedation. VAD Coordinator spoke with referral coordinator at [facility] they will refer to [facility]. Pt made aware and verbalized understanding.    Vital Signs:  Doppler Pressure: 104 Automatic BP: 115/65 (92) HR: 98 NSR O2: 100 % RA   Weight: 198.4 lbs w/ eqt Last wt: 200.4 w/ eqt   VAD Indication: Destination Therapy due to CKD Stage 3b   LVAD assessment:  HM III: Speed: 5800 rpms Flow: 5.8 Power: 4.7 w    PI: 2.7 Alarms: none Events: 10-30  Fixed speed:  5800 Low speed limit:  5500  Primary controller: back up battery due for replacement in 30 months Secondary controller:  back up battery due for replacement in 7 months  I reviewed the LVAD parameters from today and compared the results to the patient's prior recorded data. LVAD interrogation was NEGATIVE for significant power changes,  NEGATIVE for clinical alarms and STABLE for PI events/speed drops. No programming changes were made and pump is functioning within specified parameters. Pt is performing daily controller and system monitor self tests along with completing weekly and monthly maintenance for LVAD equipment.   LVAD equipment check completed and is in good working order. Back-up equipment present.    Annual Equipment Maintenance on UBC/PM was performed 10/23/22.  Exit Site Care: Dressing maintained weekly by pt's brother. Gauze VAD dressing and anchor removed and site care performed using sterile technique. Drive line exit site cleaned with Chlora prep applicators x 2, allowed to dry before gauze dressing NO SILVER STRIP applied. Exit site incorporated. The velour is fully implanted at exit site. No drainage, redness, tenderness, or foul odor noted. Small dried scab at exit site removed with cleansing. Drive line anchor re-applied x 2. Pt denies fever or chills. May continue weekly dressing using daily kits. Pt given 8 daily dressing kits and 20 anchors for at home use.  Device: Medtronic single Therapies: on 182 bpm Pacing: VVI 40 Last check:  02/28/22   BP & Labs:  Doppler 104 - Doppler is reflecting modified systolic   Hgb 11.1 - No S/S of bleeding. Specifically denies melena/BRBPR or nosebleeds.   LDH 238 - established baseline of 160 - 180. Denies tea-colored urine. No power elevations noted on interrogation.    [name] RN, BSN VAD Coordinator 24/7 Pager [phone]

## 2022-12-27 NOTE — Progress Notes (Signed)
Patient presents for 2 month f/u VAD Clinic today with brother Marcus Johnson). Reports no problems with VAD equipment or concerns with drive line.  Patient arrived via wheelchair today. Pt denies falls, heart failure symptoms, or signs of bleeding.   Patient reports he continues M/W/F dialysis. Pt confirms he takes Midodrine TID on HD days only.   Pt on Keflex 500 mg BID (dosed for HD schedule). Marcus Johnson reports they are changing dressing weekly. Dressing changed today in clinic. Currently wearing 2 anchors. Reports he wears binder at home, but does not like to wear during dialysis. See documentation below for dressing change details.   Pt has tape all around his white lead today from normal wear and tear of equipment. Pt elected to have controller changed today.  HeartMate 3 controller exchange performed at the bedside. We discussed that they should never attempt to do this at home without notifying VAD Pager first to triage need and provide support for the patient's safety as they may incur difficulties with the procedure and impair ability to restart pump. I performed elective controller exchange. New Controller [id number]. Patient tolerated controller exchange well and was asymptomatic. Pump restarted as expected with stable VAD parameters on correct prescribed speed of 5800 / 5500 rpms.     Vital Signs:  Doppler Pressure: 110 Automatc BP: 117/94 (103) HR: 86 SR O2: 100 % RA   Weight: 200.4 lbs w/ eqt Last wt: 197.6 w/ eqt   VAD Indication: Destination Therapy due to CKD Stage 3b   LVAD assessment:  HM III: Speed: 5800 rpms Flow: 5.2 Power: 4.7 w    PI: 3.1 Alarms: none Events: 10-30  Fixed speed:  5800 Low speed limit:  5500  Primary controller: back up battery due for replacement in 32 months Secondary controller:  back up battery due for replacement in 9 months  I reviewed the LVAD parameters from today and compared the results to the patient's prior recorded data. LVAD  interrogation was NEGATIVE for significant power changes, NEGATIVE for clinical alarms and STABLE for PI events/speed drops. No programming changes were made and pump is functioning within specified parameters. Pt is performing daily controller and system monitor self tests along with completing weekly and monthly maintenance for LVAD equipment.   LVAD equipment check completed and is in good working order. Back-up equipment present.    Annual Equipment Maintenance on UBC/PM was performed 10/23/22.  Exit Site Care: Dressing maintained weekly by pt's brother. Gauze VAD dressing and anchor removed and site care performed using sterile technique. Drive line exit site cleaned with Chlora prep applicators x 2, allowed to dry before gauze dressing NO SILVER STRIP applied. Exit site incorporated. The velour is fully implanted at exit site. No drainage, redness, tenderness, or foul odor noted. Small dried scab at exit site removed with cleansing. Drive line anchor re-applied x 2. Pt denies fever or chills. May continue weekly dressing using daily kits. Pt given 20 anchors for at home use.  Device: Medtronic single Therapies: on 182 bpm Pacing: VVI 40 Last check:  02/28/22   BP & Labs:  Doppler 110 - Doppler is reflecting modified systolic   Hgb 11.2 - No S/S of bleeding. Specifically denies melena/BRBPR or nosebleeds.   LDH 222 - established baseline of 160 - 180. Denies tea-colored urine. No power elevations noted on interrogation.    Marcus Johnson, BSN VAD Coordinator 24/7 Pager [phone]  Follow up for Heart Failure/LVAD:  Marcus Johnson. is a 62 y.o. male  who has a h/o DM2, CAD s/p CABG 2002, systolic HF due to ischemic cardiomyopathy with EF 20-25% (echo 12/15), DM2 and CKD. He is s/p Medtronic single chamber ICD.   Admitted in 12/15 due to ADHF. Required short course milrinone for diuresis. Diuresed 30 pounds.    CPX 2/16 showed severely reduced functional capacity.  There  was a significant disconnect between his symptoms and the CPX.  RHC in 6/16 showed fairly normal filling pressures and low but not markedly low cardiac output.    CPX (4/19) was submaximal but suggested severe limitation from HF.    He was admitted in 6/19 with marked volume overload and dyspnea.  Echo in 6/19 showed EF 20% with severe global hypokinesis.  LHC/RHC showed severe native vessel CAD with patent LIMA-D, SVG-OM, and SVG-PLV; cardiac index low.  He was started on milrinone and diuresed.  We discussed LVAD extensively in light of low output, creatinine that is trending up and concern for decompensation of RV over time. He was adamant that he did not want to pursue LVAD workup yet and did not want a referral for transplant evaluation. Milrinone was weaned off and he was discharged home.    Admitted 08/01/18-08/07/18 with volume overload and left foot wound. Diuresed 38 lbs with IV lasix and then transitioned back to torsemide 100 mg BID. ID was consulted with concerns for left foot osteomyelitis noted on CT scan. He got IV ancef q8 hours x 42 days. [facility] provided teaching and supplies for home antibiotics. Course complicated by AKI. DC weight 200 lbs.    He went into atrial flutter and had DCCV to NSR in 1/20.    Echo in 4/21 showed EF 25% with apical akinesis and diffuse hypokinesis relatively preserved in lateral wall, mildly decreased RV systolic function, PASP 41 mmHg. CPX in 5/21 showed severe functional limitation suggestive of advanced HF. 5/21 Zio patch showed short atrial flutter runs, 3.8% PVCs.    He developed COVID-19 infection in 1/22.  Symptoms were predominantly GI.    He developed progressive worsening of CHF in early 2022 and was admitted in 3/22 with low output HF.  Impella 5.5 was placed as bridge to Heartmate III LVAD placed in 10/13/20.  He developed post-op renal failure and ended up on dialysis.  He was markedly deconditioned post-op and went to CIR.    Still has pain and  minimal function in his right hand.  Nerve conduction study showed a chronic sensorimotor neuropathy in the right arm.    Ramp echo in 3/23 showed severe LV dysfunction with mild RV enlargement and mild dysfunction, mild-moderate AI.  Speed was increased to 5800 rpm.  The aortic valve still opened with every beat but only slightly. Flow increased from 4.1 to 5.3 L/min and PI remained stable.   Patient was admitted in 4/23 with falls and lightheadedness. He fractured his left foot when falling and initially was unable to walk.  He was thought to be hypotensive due to over-diuresis and dry weight was increased.  Midodrine on HD days was also increased.  He was treated for a driveline infection with vancomycin then doxycycline, now off.   ICD shock for VT in 7/23, not started on amiodarone given only 1 episode.   Echo was done today and reviewed, EF 20-25% with midline IV septum, mild RV dysfunction with severe RVE, moderate AI.   Patient returns today for followup of CHF/LVAD.  MAP 92 today.  He takes midodrine on HD days to keep  BP up with HD. No low flows though PI drops < 2 after HD.  He has had some nausea post-HD though the last 2 treatments went ok.  Not very active but denies dyspnea walking on flat ground. No lightheadedness. No BRBPR/melena.   I reviewed the LVAD parameters from today, and compared the results to the patient's prior recorded data.  No programming changes were made.  The LVAD is functioning within specified parameters.  The patient performs LVAD self-test daily.  LVAD interrogation was negative for any significant power changes, alarms or PI events/speed drops.  LVAD equipment check completed and is in good working order.  Back-up equipment present.   LVAD education done on emergency procedures and precautions and reviewed exit site care.  Labs (5/22): LDH 174, hgb 10.6 Labs (7/22): LDH 164 Labs (8/22): hgb 11.4 Labs (10/22): hgb 12 Labs (11/22): hgb 12.2 Labs (4/23): LDL  163, hgb 10.4 Labs (6/23): hgb 12.2, LDH 284 Labs (7/23): LFTs normal, hgb 11.3, LDH 235 Labs (9/23): hgb 10.2 Labs (1/24): hgb 10.6, LDH 279 Labs (3/24): LDL 222, hgb 11.2   Past Medical History:  Diagnosis Date   AICD (automatic cardioverter/defibrillator) present    Single-chamber  implantable cardiac defibrillator - Medtronic   Atrial fibrillation (HCC)    Cataract    Mixed form OU   CHF (congestive heart failure) (HCC)    Chronic kidney disease    Chronic kidney disease (CKD), stage III (moderate) (HCC)    Chronic systolic heart failure (HCC)    a. Echo 5/13: Mild LVE, mild LVH, EF 10%, anteroseptal, lateral, apical AK, mild MR, mild LAE, moderately reduced RVSF, mild RAE, PASP 60;  b. 07/2014 Echo: EF 20-2%, diff HK, AKI of antsep/apical/mid-apicalinferior, mod reduced RV.   Coronary artery disease    a. s/p CABG 2002 b. LHC 5/13:  dLM 80%, LAD subtotally occluded, pCFX occluded, pRCA 50%, mid? Occlusion with high take off of the PDA with 70% multiple lesions-not bypassed and supplies collaterals to LAD, LIMA-IM/ramus ok, S-OM ok, S-PLA branches ok. Medical therapy was recommended   COVID    Diabetic retinopathy (HCC)    NPDR OD, PDR OS   Dyspnea    Gout    "on daily RX" (01/08/2018)   Hypertension    Hypertensive retinopathy    OU   Ischemic cardiomyopathy    a. Echo 5/13: Mild LVE, mild LVH, EF 10%, anteroseptal, lateral, apical AK, mild MR, mild LAE, moderately reduced RVSF, mild RAE, PASP 60;  b. 01/2012 s/p MDT [id number] Protecta XT VR AICD;  c. 07/2014 Echo: EF 20-2%, diff HK, AKI of antsep/apical/mid-apicalinferior, mod reduced RV.   MRSA (methicillin resistant Staphylococcus aureus)    Status post right foot plantar deep infection with MRSA status post  I&D 10/2008   Myocardial infarction [facility])    "was told I'd had several before heart OR in 2002" (01/08/2018)   Noncompliance    Peripheral neuropathy    Retinopathy, diabetic, background (HCC)    Syncope    Type II  diabetes mellitus (HCC)    requiring insulin    Vitreous hemorrhage, left (HCC)    and proliferative diabetic retinopathy    Current Outpatient Medications  Medication Sig Dispense Refill   atorvastatin (LIPITOR) 40 MG tablet Take 1 tablet (40 mg total) by mouth daily.     Blood Glucose Monitoring Suppl (ONE TOUCH ULTRA 2) w/Device KIT 1 Device by Does not apply route 3 (three) times daily. 1 kit 0   cephALEXin (  KEFLEX) 500 MG capsule Take 1 capsule (500 mg total) by mouth 2 (two) times daily. Take evening dose after dialysis on HD days. 120 capsule 1   gabapentin (NEURONTIN) 100 MG capsule Take 1-2 capsules (100-200 mg total) by mouth See admin instructions. Taking 1 capsule ( 100 mg) in the AM and 2 capsules ( 200 mg) at bedtime 90 capsule 3   glucose blood (ACCU-CHEK GUIDE) test strip USE AS INSTRUCTED 3 TIMES DAILY 100 strip 1   insulin glargine (LANTUS SOLOSTAR) 100 UNIT/ML Solostar Pen Inject 60 Units into the skin at bedtime. (Patient taking differently: Inject 60 Units into the skin daily as needed.) 30 mL 11   midodrine (PROAMATINE) 10 MG tablet Take 2 tablets (20 mg total) by mouth 3 (three) times daily. Three times a day on dialysis days; NO midodrine on NON dialysis days 160 tablet 11   multivitamin (RENA-VIT) TABS tablet TAKE 1 TABLET BY MOUTH EVERYDAY AT BEDTIME 90 tablet 3   naphazoline-glycerin (CLEAR EYES REDNESS) 0.012-0.25 % SOLN Place 1-2 drops into the right eye 4 (four) times daily as needed for eye irritation.     ondansetron (ZOFRAN) 4 MG tablet Take 1 tablet (4 mg total) by mouth every 8 (eight) hours as needed for nausea or vomiting. 30 tablet 3   OneTouch UltraSoft 2 Lancets MISC 1 each by Does not apply route 3 (three) times daily before meals. 100 each 1   pantoprazole (PROTONIX) 40 MG tablet TAKE 1 TABLET BY MOUTH EVERY DAY 90 tablet 3   polyethylene glycol powder (GLYCOLAX/MIRALAX) 17 GM/SCOOP powder      rosuvastatin (CRESTOR) 10 MG tablet TAKE 1 TABLET BY MOUTH  EVERY DAY 30 tablet 0   sevelamer (RENAGEL) 800 MG tablet Take 800 mg by mouth 3 (three) times daily.     traMADol (ULTRAM) 50 MG tablet Take 1 tablet (50 mg total) by mouth every 12 (twelve) hours as needed. 30 tablet 0   warfarin (COUMADIN) 3 MG tablet TAKE 1.5 TABLET EVERY TUESDAY/SATURDAY AND 1 TAB ALL OTHER DAYS OR AS DIRECTED BY HF CLINIC 103 tablet 6   acetaminophen (TYLENOL) 325 MG tablet Take by mouth.     allopurinol (ZYLOPRIM) 300 MG tablet Take by mouth. (Patient not taking: Reported on 12/27/2022)     No current facility-administered medications for this encounter.    Meclizine hcl and Ivabradine  REVIEW OF SYSTEMS: All systems negative except as listed in HPI, PMH and Problem list.   LVAD INTERROGATION:  Please see LVAD nurse's note above.   MAP 92  Physical Exam: General: Well appearing this am. NAD.  HEENT: Normal. Neck: Supple, JVP 7-8 cm. Carotids OK.  Cardiac:  Mechanical heart sounds with LVAD hum present.  Lungs:  CTAB, normal effort.  Abdomen:  NT, ND, no HSM. No bruits or masses. +BS  LVAD exit site: Well-healed and incorporated. Dressing dry and intact. No erythema or drainage. Stabilization device present and accurately applied. Driveline dressing changed daily per sterile technique. Extremities:  Warm and dry. No cyanosis, clubbing, rash, or edema.  Neuro:  Alert & oriented x 3. Cranial nerves grossly intact. Moves all 4 extremities w/o difficulty. Affect pleasant    ASSESSMENT AND PLAN:  1. Chronic systolic CHF:  Ischemic cardiomyopathy. Has Medtronic ICD. Low output HF, admitted and Impella 5.5 placed 10/04/20.  HM3 LVAD was placed on 10/13/20. Developed post-op renal failure requiring eventual hemodialysis.  Ramp echo in 3/23, speed increased to 5800 rpm under echo guidance with increase in  flow and stable PI.  Echo in 3/24 showed EF 20-25% with midline IV septum, mild RV dysfunction with severe RVE, moderate AI. MAP mildly elevated today (92) but drops on HD  days without midodrine.  He is not volume overloaded.  - Continue midodrine on HD days.   2. LVAD:  VAD interrogated personally.  Parameters stable with PI events on HD days. No low flow events.  - He is now off ASA.  - Continue warfarin with INR goal 2-2.5.   3. CAD s/p CABG 2002:  Last cath in 6/19 with patent grafts. No chest pain.  - Continue Crestor.  4. ESRD: Currently dialyzing via tunneled catheter. No low flow events. He has had some nausea after dialysis.  - Continue HD M-W-F  - With continuous flow LVAD, think AV fistula would be unlikely to mature.   - Can use Zofran with nausea.  5. Atrial flutter/fibrillation: s/p DC-CV 08/21/18.  Has occasional episodes of AF but not prolonged.  6. 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 This has been improving though not completely normal.   7. DM: Per primary care.  8. Anemia: CBC today.  9. Suspected Gastroparesis: He is no longer taking Reglan.  10. VT: Episode in 7/23 with successful ICD discharge.  No definite trigger, he did not actually feel the VT. No further episodes.  - As this was his first episode and there has been no recurrence, we did not start amiodarone.  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 Driveline site looks good today.   Marca Ancona 12/27/2022

## 2022-12-27 NOTE — Addendum Note (Signed)
Encounter addended by: Flora Lipps, RN on: 12/27/2022 1:18 PM  Actions taken: Vitals modified, Pend clinical note

## 2022-12-27 NOTE — Addendum Note (Signed)
Encounter addended by: Flora Lipps, RN on: 12/27/2022 4:12 PM  Actions taken: Clinical Note Signed

## 2022-12-28 DIAGNOSIS — N186 End stage renal disease: Secondary | ICD-10-CM | POA: Diagnosis not present

## 2022-12-28 DIAGNOSIS — Z992 Dependence on renal dialysis: Secondary | ICD-10-CM | POA: Diagnosis not present

## 2022-12-28 DIAGNOSIS — N2581 Secondary hyperparathyroidism of renal origin: Secondary | ICD-10-CM | POA: Diagnosis not present

## 2022-12-31 DIAGNOSIS — Z992 Dependence on renal dialysis: Secondary | ICD-10-CM | POA: Diagnosis not present

## 2022-12-31 DIAGNOSIS — N2581 Secondary hyperparathyroidism of renal origin: Secondary | ICD-10-CM | POA: Diagnosis not present

## 2022-12-31 DIAGNOSIS — N186 End stage renal disease: Secondary | ICD-10-CM | POA: Diagnosis not present

## 2023-01-01 ENCOUNTER — Ambulatory Visit (HOSPITAL_COMMUNITY): Payer: Self-pay | Admitting: Pharmacist

## 2023-01-01 DIAGNOSIS — Z7901 Long term (current) use of anticoagulants: Secondary | ICD-10-CM | POA: Diagnosis not present

## 2023-01-01 LAB — POCT INR: INR: 1.4 — AB (ref 2.0–3.0)

## 2023-01-01 NOTE — Progress Notes (Signed)
LVAD INR 

## 2023-01-02 ENCOUNTER — Ambulatory Visit (HOSPITAL_COMMUNITY)
Admission: RE | Admit: 2023-01-02 | Discharge: 2023-01-02 | Disposition: A | Discharge status: Home / Self Care | Payer: Medicare HMO | Source: Ambulatory Visit | Attending: Family Medicine | Admitting: Family Medicine

## 2023-01-02 ENCOUNTER — Telehealth (HOSPITAL_COMMUNITY): Payer: Self-pay

## 2023-01-02 ENCOUNTER — Other Ambulatory Visit (HOSPITAL_COMMUNITY): Payer: Self-pay | Admitting: Unknown Physician Specialty

## 2023-01-02 DIAGNOSIS — Z992 Dependence on renal dialysis: Secondary | ICD-10-CM | POA: Diagnosis not present

## 2023-01-02 DIAGNOSIS — Z95811 Presence of heart assist device: Secondary | ICD-10-CM | POA: Insufficient documentation

## 2023-01-02 DIAGNOSIS — R319 Hematuria, unspecified: Secondary | ICD-10-CM | POA: Diagnosis not present

## 2023-01-02 DIAGNOSIS — N2581 Secondary hyperparathyroidism of renal origin: Secondary | ICD-10-CM | POA: Diagnosis not present

## 2023-01-02 DIAGNOSIS — N186 End stage renal disease: Secondary | ICD-10-CM | POA: Diagnosis not present

## 2023-01-02 LAB — URINALYSIS, ROUTINE W REFLEX MICROSCOPIC
Bilirubin Urine: NEGATIVE
Glucose, UA: 50 mg/dL — AB
Ketones, ur: NEGATIVE mg/dL
Nitrite: NEGATIVE
Protein, ur: >=300 mg/dL — AB
RBC / HPF: >50 RBC/hpf (ref 0–5)
Specific Gravity, Urine: 1.013 (ref 1.005–1.030)
WBC, UA: >50 WBC/hpf (ref 0–5)
pH: 7 (ref 5.0–8.0)

## 2023-01-02 NOTE — Progress Notes (Signed)
Pt reports to clinic today for u/a and urine cx. Pt paged the pager this morning stating that he has blood in his stool. Pt states that he only urinates very little approx 2x a day. Pt denies any pain. Blood is dark and not a large amount. D/w Dr Shirlee Latch pt informed to come to clinic today for u/a and urine cx. Urine obtained clean catch and sent to lab.   Carlton Adam RN, BSN VAD Coordinator 24/7 Pager [phone]

## 2023-01-03 ENCOUNTER — Other Ambulatory Visit (HOSPITAL_COMMUNITY): Payer: Self-pay | Admitting: Pharmacist

## 2023-01-03 ENCOUNTER — Encounter: Payer: Self-pay | Admitting: Podiatry

## 2023-01-03 ENCOUNTER — Ambulatory Visit (INDEPENDENT_AMBULATORY_CARE_PROVIDER_SITE_OTHER): Payer: Medicare HMO | Admitting: Podiatry

## 2023-01-03 DIAGNOSIS — Z7901 Long term (current) use of anticoagulants: Secondary | ICD-10-CM

## 2023-01-03 DIAGNOSIS — E1122 Type 2 diabetes mellitus with diabetic chronic kidney disease: Secondary | ICD-10-CM

## 2023-01-03 DIAGNOSIS — Z992 Dependence on renal dialysis: Secondary | ICD-10-CM | POA: Diagnosis not present

## 2023-01-03 DIAGNOSIS — M79674 Pain in right toe(s): Secondary | ICD-10-CM

## 2023-01-03 DIAGNOSIS — Q828 Other specified congenital malformations of skin: Secondary | ICD-10-CM | POA: Diagnosis not present

## 2023-01-03 DIAGNOSIS — B351 Tinea unguium: Secondary | ICD-10-CM | POA: Diagnosis not present

## 2023-01-03 DIAGNOSIS — Z794 Long term (current) use of insulin: Secondary | ICD-10-CM

## 2023-01-03 DIAGNOSIS — N186 End stage renal disease: Secondary | ICD-10-CM | POA: Diagnosis not present

## 2023-01-03 DIAGNOSIS — K3184 Gastroparesis: Secondary | ICD-10-CM

## 2023-01-03 DIAGNOSIS — Z95811 Presence of heart assist device: Secondary | ICD-10-CM

## 2023-01-03 DIAGNOSIS — I951 Orthostatic hypotension: Secondary | ICD-10-CM

## 2023-01-03 DIAGNOSIS — M79675 Pain in left toe(s): Secondary | ICD-10-CM | POA: Diagnosis not present

## 2023-01-03 LAB — URINE CULTURE: Culture: NO GROWTH

## 2023-01-03 MED ORDER — WARFARIN SODIUM 3 MG PO TABS: ORAL_TABLET | ORAL | 3 refills | Status: DC

## 2023-01-03 NOTE — Progress Notes (Signed)
This patient presents  to my office for at risk foot care.  This patient requires this care by a professional since this patient will be at risk due to having  CKD, coagulation defect and DM.  This patient is unable to cut nails himself since the patient cannot reach his nails.These nails are painful walking and wearing shoes. Painful callus right  foot.Marland Kitchen  He presents to the office with his brother.  This patient presents for at risk foot care today.  General Appearance  Alert, conversant and in no acute stress.  Vascular  Dorsalis pedis and posterior tibial  pulses are absent  bilaterally.  Capillary return is within normal limits  bilaterally. Temperature is within normal limits  bilaterally.  Neurologic  Senn-Weinstein monofilament wire test absent  bilaterally. Muscle power within normal limits bilaterally.  Nails Thick disfigured discolored nails with subungual debris  from hallux to fifth toes bilaterally. No evidence of bacterial infection or drainage bilaterally.  Orthopedic  No limitations of motion  feet .  No crepitus or effusions noted.  No bony pathology or digital deformities noted.  No  STJ ROM left foot.  Skin  normotropic skin with no porokeratosis noted bilaterally.  No signs of infections or ulcers noted.   Callus lateral aspect fifth metabase left foot asymptomatic. Callus right heel.  Onychomycosis  Pain in right toes  Pain in left toes  Consent was obtained for treatment procedures.   Mechanical debridement of nails 1-5  bilaterally performed with a nail nipper.  Filed with dremel without incident.   Debride porokeratosis left foot with dremel tool.    Return office visit     3 months                 Told patient to return for periodic foot care and evaluation due to potential at risk complications.   Helane Gunther DPM

## 2023-01-03 NOTE — Telephone Encounter (Signed)
error 

## 2023-01-04 DIAGNOSIS — N186 End stage renal disease: Secondary | ICD-10-CM | POA: Diagnosis not present

## 2023-01-04 DIAGNOSIS — Z992 Dependence on renal dialysis: Secondary | ICD-10-CM | POA: Diagnosis not present

## 2023-01-04 DIAGNOSIS — I509 Heart failure, unspecified: Secondary | ICD-10-CM | POA: Diagnosis not present

## 2023-01-04 DIAGNOSIS — N2581 Secondary hyperparathyroidism of renal origin: Secondary | ICD-10-CM | POA: Diagnosis not present

## 2023-01-07 DIAGNOSIS — Z992 Dependence on renal dialysis: Secondary | ICD-10-CM | POA: Diagnosis not present

## 2023-01-07 DIAGNOSIS — N2581 Secondary hyperparathyroidism of renal origin: Secondary | ICD-10-CM | POA: Diagnosis not present

## 2023-01-07 DIAGNOSIS — N186 End stage renal disease: Secondary | ICD-10-CM | POA: Diagnosis not present

## 2023-01-08 ENCOUNTER — Ambulatory Visit (HOSPITAL_COMMUNITY): Payer: Self-pay | Admitting: Pharmacist

## 2023-01-08 ENCOUNTER — Ambulatory Visit (INDEPENDENT_AMBULATORY_CARE_PROVIDER_SITE_OTHER): Payer: Medicare HMO

## 2023-01-08 ENCOUNTER — Encounter: Payer: Self-pay | Admitting: Family

## 2023-01-08 ENCOUNTER — Telehealth: Payer: Medicare HMO | Admitting: Internal Medicine

## 2023-01-08 ENCOUNTER — Telehealth (INDEPENDENT_AMBULATORY_CARE_PROVIDER_SITE_OTHER): Payer: Medicare HMO | Admitting: Family

## 2023-01-08 DIAGNOSIS — T827XXA Infection and inflammatory reaction due to other cardiac and vascular devices, implants and grafts, initial encounter: Secondary | ICD-10-CM

## 2023-01-08 DIAGNOSIS — I5042 Chronic combined systolic (congestive) and diastolic (congestive) heart failure: Secondary | ICD-10-CM | POA: Diagnosis not present

## 2023-01-08 LAB — CUP PACEART REMOTE DEVICE CHECK
Battery Remaining Longevity: 121 mo
Battery Voltage: 3.01 V
Brady Statistic RV Percent Paced: 0.01 %
Date Time Interrogation Session: 20240604070019
HighPow Impedance: 37 Ohm
HighPow Impedance: 44 Ohm
Implantable Lead Connection Status: 753985
Implantable Lead Implant Date: 20130603
Implantable Lead Location: 753860
Implantable Lead Model: 6947
Implantable Pulse Generator Implant Date: 20211129
Lead Channel Impedance Value: 399 Ohm
Lead Channel Impedance Value: 646 Ohm
Lead Channel Pacing Threshold Amplitude: 1.25 V
Lead Channel Pacing Threshold Pulse Width: 0.4 ms
Lead Channel Sensing Intrinsic Amplitude: 6.5 mV
Lead Channel Sensing Intrinsic Amplitude: 6.5 mV
Lead Channel Setting Pacing Amplitude: 3.5 V
Lead Channel Setting Pacing Pulse Width: 0.4 ms
Lead Channel Setting Sensing Sensitivity: 0.3 mV
Zone Setting Status: 755011
Zone Setting Status: 755011

## 2023-01-08 LAB — POCT INR: INR: 2.3 (ref 2.0–3.0)

## 2023-01-08 MED ORDER — CEPHALEXIN 500 MG PO CAPS: 500.0000 mg | ORAL_CAPSULE | Freq: Two times a day (BID) | ORAL | 1 refills | Status: DC

## 2023-01-08 NOTE — Patient Instructions (Signed)
Nice to see you.  Continue to take your medication daily as prescribed.  Refills have been sent to the pharmacy.  Plan for follow up in 3 months or sooner if needed with lab work on the same day.  Have a great day and stay safe!  

## 2023-01-08 NOTE — Assessment & Plan Note (Signed)
Mr. Fierstein continues to have suppression of his MSSA infection with good adherence and tolerance to cephalexin. No current evidence of infection. Reviewed plan of care and will continue current dose of cephalexin. Continue drive line care per LVAD team. Plan for follow up in 3 months or sooner if needed.
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
## 2023-01-09 ENCOUNTER — Telehealth: Payer: Self-pay

## 2023-01-09 DIAGNOSIS — Z992 Dependence on renal dialysis: Secondary | ICD-10-CM | POA: Diagnosis not present

## 2023-01-09 DIAGNOSIS — N2581 Secondary hyperparathyroidism of renal origin: Secondary | ICD-10-CM | POA: Diagnosis not present

## 2023-01-09 DIAGNOSIS — N186 End stage renal disease: Secondary | ICD-10-CM | POA: Diagnosis not present

## 2023-01-09 NOTE — Telephone Encounter (Signed)
Spoke w/ patient. Asymptomatic during alerted episodes shown below. Of note, today 6/5, pt had HD where they removed 4L, so patient was net positive at least 4L at the time of the dysrhythmias. Educated patient as to importance of med regimen adherence, shock plan, and driving restrictions X6 months. Routed to MD.

## 2023-01-09 NOTE — Telephone Encounter (Signed)
He had VT in 7/23.  Now with additional episodes and ATP.  Also had some AF.  Think at this point would add amiodarone to his regimen, 200 mg bid.  Would get followup in clinic also.

## 2023-01-09 NOTE — Telephone Encounter (Signed)
Following alert received from [employer] received for    Scheduled remote reviewed. Normal device function.   1 VT episode treated with ATP x 1 burst on 12/09/22 at 11:16, 10 sec, 188 bpm. Routed to triage for review.  3 monitored VT and 3 VT-NS classified events, longest on 01/02/23 at 08:30 of 11 min 24 sec duration at 161 bpm in monitor-only zone. Routed to triage for review. 439 AF classified events, Burden 8.7%, longest 12 hr 52 min, average V rates primarily 70-140 bpm, on warfarin per Epic. Next remote 91 days. [facility], [facility].

## 2023-01-10 ENCOUNTER — Other Ambulatory Visit (HOSPITAL_COMMUNITY): Payer: Self-pay | Admitting: *Deleted

## 2023-01-10 DIAGNOSIS — I472 Ventricular tachycardia, unspecified: Secondary | ICD-10-CM

## 2023-01-10 DIAGNOSIS — Z95811 Presence of heart assist device: Secondary | ICD-10-CM

## 2023-01-10 MED ORDER — AMIODARONE HCL 200 MG PO TABS: 200.0000 mg | ORAL_TABLET | Freq: Two times a day (BID) | ORAL | 6 refills | Status: DC

## 2023-01-10 MED ORDER — AMIODARONE HCL 200 MG PO TABS: 200.0000 mg | ORAL_TABLET | Freq: Every day | ORAL | 6 refills | Status: DC

## 2023-01-10 NOTE — Progress Notes (Addendum)
Received alert from device clinic:  Following alert received from [employer] received for      Scheduled remote reviewed. Normal device function.   1 VT episode treated with ATP x 1 burst on 12/09/22 at 11:16, 10 sec, 188 bpm. Routed to triage for review.  3 monitored VT and 3 VT-NS classified events, longest on 01/02/23 at 08:30 of 11 min 24 sec duration at 161 bpm in monitor-only zone. Routed to triage for review. 439 AF classified events, Burden 8.7%, longest 12 hr 52 min, average V rates primarily 70-140 bpm, on warfarin per Epic.   Per device clinic pt was asymptomatic during episodes.   Per Dr Shirlee Latch- He had VT in 7/23. Now with additional episodes and ATP. Also had some AF. Think at this point would add amiodarone to his regimen, 200 mg bid. Would get followup in clinic also.   Amiodarone 200 mg BID x 2, then decrease to 200 mg daily sent to pt's pharmacy. Follow up appt schedule 01/31/23 at 11:00 per provider's availability.   Pt updated on need for Amiodarone and follow up appt. He verbalized understanding.   Alyce Pagan RN VAD Coordinator  Office: [phone]  24/7 Pager: [phone]

## 2023-01-10 NOTE — Addendum Note (Signed)
Addended by: Alyce Pagan B on: 01/10/2023 05:38 PM   Modules accepted: Orders

## 2023-01-11 DIAGNOSIS — N2581 Secondary hyperparathyroidism of renal origin: Secondary | ICD-10-CM | POA: Diagnosis not present

## 2023-01-11 DIAGNOSIS — Z992 Dependence on renal dialysis: Secondary | ICD-10-CM | POA: Diagnosis not present

## 2023-01-11 DIAGNOSIS — N186 End stage renal disease: Secondary | ICD-10-CM | POA: Diagnosis not present

## 2023-01-14 DIAGNOSIS — N186 End stage renal disease: Secondary | ICD-10-CM | POA: Diagnosis not present

## 2023-01-14 DIAGNOSIS — Z992 Dependence on renal dialysis: Secondary | ICD-10-CM | POA: Diagnosis not present

## 2023-01-14 DIAGNOSIS — N2581 Secondary hyperparathyroidism of renal origin: Secondary | ICD-10-CM | POA: Diagnosis not present

## 2023-01-15 ENCOUNTER — Ambulatory Visit (HOSPITAL_COMMUNITY): Payer: Self-pay | Admitting: Pharmacist

## 2023-01-15 LAB — POCT INR: INR: 2.1 (ref 2.0–3.0)

## 2023-01-16 DIAGNOSIS — N186 End stage renal disease: Secondary | ICD-10-CM | POA: Diagnosis not present

## 2023-01-16 DIAGNOSIS — N2581 Secondary hyperparathyroidism of renal origin: Secondary | ICD-10-CM | POA: Diagnosis not present

## 2023-01-16 DIAGNOSIS — Z992 Dependence on renal dialysis: Secondary | ICD-10-CM | POA: Diagnosis not present
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
## 2023-01-17 ENCOUNTER — Other Ambulatory Visit (HOSPITAL_COMMUNITY): Payer: Self-pay | Admitting: Cardiology

## 2023-01-17 DIAGNOSIS — Z95811 Presence of heart assist device: Secondary | ICD-10-CM

## 2023-01-17 DIAGNOSIS — K3184 Gastroparesis: Secondary | ICD-10-CM

## 2023-01-17 DIAGNOSIS — I951 Orthostatic hypotension: Secondary | ICD-10-CM

## 2023-01-17 DIAGNOSIS — Z7901 Long term (current) use of anticoagulants: Secondary | ICD-10-CM

## 2023-01-18 DIAGNOSIS — N186 End stage renal disease: Secondary | ICD-10-CM | POA: Diagnosis not present

## 2023-01-18 DIAGNOSIS — Z992 Dependence on renal dialysis: Secondary | ICD-10-CM | POA: Diagnosis not present

## 2023-01-18 DIAGNOSIS — N2581 Secondary hyperparathyroidism of renal origin: Secondary | ICD-10-CM | POA: Diagnosis not present

## 2023-01-19 ENCOUNTER — Other Ambulatory Visit: Payer: Self-pay | Admitting: Family Medicine

## 2023-01-21 DIAGNOSIS — Z992 Dependence on renal dialysis: Secondary | ICD-10-CM | POA: Diagnosis not present

## 2023-01-21 DIAGNOSIS — N2581 Secondary hyperparathyroidism of renal origin: Secondary | ICD-10-CM | POA: Diagnosis not present

## 2023-01-21 DIAGNOSIS — N186 End stage renal disease: Secondary | ICD-10-CM | POA: Diagnosis not present

## 2023-01-22 ENCOUNTER — Ambulatory Visit (HOSPITAL_COMMUNITY): Payer: Self-pay | Admitting: Pharmacist

## 2023-01-22 ENCOUNTER — Ambulatory Visit (INDEPENDENT_AMBULATORY_CARE_PROVIDER_SITE_OTHER): Payer: Medicare HMO | Admitting: Ophthalmology

## 2023-01-22 ENCOUNTER — Encounter (INDEPENDENT_AMBULATORY_CARE_PROVIDER_SITE_OTHER): Payer: Self-pay | Admitting: Ophthalmology

## 2023-01-22 DIAGNOSIS — Z794 Long term (current) use of insulin: Secondary | ICD-10-CM | POA: Diagnosis not present

## 2023-01-22 DIAGNOSIS — H35033 Hypertensive retinopathy, bilateral: Secondary | ICD-10-CM

## 2023-01-22 DIAGNOSIS — I10 Essential (primary) hypertension: Secondary | ICD-10-CM

## 2023-01-22 DIAGNOSIS — E113513 Type 2 diabetes mellitus with proliferative diabetic retinopathy with macular edema, bilateral: Secondary | ICD-10-CM | POA: Diagnosis not present

## 2023-01-22 DIAGNOSIS — H25813 Combined forms of age-related cataract, bilateral: Secondary | ICD-10-CM | POA: Diagnosis not present

## 2023-01-22 DIAGNOSIS — H4312 Vitreous hemorrhage, left eye: Secondary | ICD-10-CM

## 2023-01-22 LAB — POCT INR: INR: 2.6 (ref 2.0–3.0)

## 2023-01-22 MED ORDER — BEVACIZUMAB CHEMO INJECTION 1.25MG/0.05ML SYRINGE FOR KALEIDOSCOPE
1.2500 mg | INTRAVITREAL | Status: AC | PRN
  Administered 2023-01-22: 1.25 mg via INTRAVITREAL

## 2023-01-23 ENCOUNTER — Other Ambulatory Visit (HOSPITAL_COMMUNITY): Payer: Self-pay | Admitting: Cardiology

## 2023-01-23 DIAGNOSIS — G629 Polyneuropathy, unspecified: Secondary | ICD-10-CM

## 2023-01-23 DIAGNOSIS — N2581 Secondary hyperparathyroidism of renal origin: Secondary | ICD-10-CM | POA: Diagnosis not present

## 2023-01-23 DIAGNOSIS — Z992 Dependence on renal dialysis: Secondary | ICD-10-CM | POA: Diagnosis not present

## 2023-01-23 DIAGNOSIS — N186 End stage renal disease: Secondary | ICD-10-CM | POA: Diagnosis not present

## 2023-01-24 ENCOUNTER — Encounter: Payer: Self-pay | Admitting: Family Medicine

## 2023-01-24 ENCOUNTER — Other Ambulatory Visit: Payer: Self-pay

## 2023-01-24 ENCOUNTER — Telehealth: Payer: Self-pay | Admitting: Family Medicine

## 2023-01-24 DIAGNOSIS — E1122 Type 2 diabetes mellitus with diabetic chronic kidney disease: Secondary | ICD-10-CM

## 2023-01-24 MED ORDER — ONETOUCH ULTRASOFT 2 LANCETS MISC: 1.0000 | Freq: Three times a day (TID) | 1 refills | Status: DC

## 2023-01-24 NOTE — Telephone Encounter (Signed)
Prescription Request  01/24/2023  LOV: 03/27/2022  What is the name of the medication or equipment? OneTouch UltraSoft 2 Lancets OneTouch UltraSoft 2 Lancets MISC  Have you contacted your pharmacy to request a refill? No   Which pharmacy would you like this sent to?  [id number] [name], [place] - [name] [address] Phone: [phone] Fax: [phone]    Patient notified that their request is being sent to the clinical staff for review and that they should receive a response within 2 business days.   Please advise at Mobile [phone] (mobile)

## 2023-01-24 NOTE — Telephone Encounter (Signed)
Done

## 2023-01-25 DIAGNOSIS — Z992 Dependence on renal dialysis: Secondary | ICD-10-CM | POA: Diagnosis not present

## 2023-01-25 DIAGNOSIS — N186 End stage renal disease: Secondary | ICD-10-CM | POA: Diagnosis not present

## 2023-01-25 DIAGNOSIS — N2581 Secondary hyperparathyroidism of renal origin: Secondary | ICD-10-CM | POA: Diagnosis not present

## 2023-01-28 ENCOUNTER — Other Ambulatory Visit: Payer: Self-pay

## 2023-01-28 DIAGNOSIS — N2581 Secondary hyperparathyroidism of renal origin: Secondary | ICD-10-CM | POA: Diagnosis not present

## 2023-01-28 DIAGNOSIS — Z992 Dependence on renal dialysis: Secondary | ICD-10-CM | POA: Diagnosis not present

## 2023-01-28 DIAGNOSIS — N186 End stage renal disease: Secondary | ICD-10-CM | POA: Diagnosis not present

## 2023-01-28 MED ORDER — INSULIN PEN NEEDLE 32G X 4 MM MISC: 0 refills | Status: DC

## 2023-01-28 NOTE — Telephone Encounter (Addendum)
Pt states he was sent the wrong Rx.  Pt states he was sent the lancets and he does not want that.  Pt says he cannot use his pen without these 4mm pen needles. I do not see the needles he is requesting on his Med List.   [facility] [name], [place] - [address] Phone: [phone]  Fax: [phone]

## 2023-01-29 ENCOUNTER — Ambulatory Visit (HOSPITAL_COMMUNITY): Payer: Self-pay | Admitting: Pharmacist

## 2023-01-29 DIAGNOSIS — Z7901 Long term (current) use of anticoagulants: Secondary | ICD-10-CM | POA: Diagnosis not present

## 2023-01-29 LAB — POCT INR: INR: 2.6 (ref 2.0–3.0)

## 2023-01-30 ENCOUNTER — Other Ambulatory Visit (HOSPITAL_COMMUNITY): Payer: Self-pay | Admitting: *Deleted

## 2023-01-30 DIAGNOSIS — Z992 Dependence on renal dialysis: Secondary | ICD-10-CM | POA: Diagnosis not present

## 2023-01-30 DIAGNOSIS — N2581 Secondary hyperparathyroidism of renal origin: Secondary | ICD-10-CM | POA: Diagnosis not present

## 2023-01-30 DIAGNOSIS — Z7901 Long term (current) use of anticoagulants: Secondary | ICD-10-CM

## 2023-01-30 DIAGNOSIS — N186 End stage renal disease: Secondary | ICD-10-CM | POA: Diagnosis not present

## 2023-01-30 DIAGNOSIS — Z95811 Presence of heart assist device: Secondary | ICD-10-CM

## 2023-01-30 DIAGNOSIS — Z79899 Other long term (current) drug therapy: Secondary | ICD-10-CM

## 2023-01-30 NOTE — Addendum Note (Signed)
Addended by: Alyce Pagan B on: 01/30/2023 09:05 AM   Modules accepted: Orders

## 2023-01-31 ENCOUNTER — Encounter (HOSPITAL_COMMUNITY): Payer: Self-pay | Admitting: Cardiology

## 2023-01-31 ENCOUNTER — Ambulatory Visit (HOSPITAL_COMMUNITY)
Admission: RE | Admit: 2023-01-31 | Discharge: 2023-01-31 | Disposition: A | Discharge status: Home / Self Care | Payer: Medicare HMO | Source: Ambulatory Visit | Attending: Cardiology | Admitting: Cardiology

## 2023-01-31 DIAGNOSIS — M79641 Pain in right hand: Secondary | ICD-10-CM | POA: Diagnosis not present

## 2023-01-31 DIAGNOSIS — E1142 Type 2 diabetes mellitus with diabetic polyneuropathy: Secondary | ICD-10-CM | POA: Diagnosis not present

## 2023-01-31 DIAGNOSIS — I4891 Unspecified atrial fibrillation: Secondary | ICD-10-CM | POA: Insufficient documentation

## 2023-01-31 DIAGNOSIS — I4892 Unspecified atrial flutter: Secondary | ICD-10-CM | POA: Diagnosis not present

## 2023-01-31 DIAGNOSIS — I255 Ischemic cardiomyopathy: Secondary | ICD-10-CM | POA: Insufficient documentation

## 2023-01-31 DIAGNOSIS — I2510 Atherosclerotic heart disease of native coronary artery without angina pectoris: Secondary | ICD-10-CM | POA: Diagnosis not present

## 2023-01-31 DIAGNOSIS — B9561 Methicillin susceptible Staphylococcus aureus infection as the cause of diseases classified elsewhere: Secondary | ICD-10-CM | POA: Insufficient documentation

## 2023-01-31 DIAGNOSIS — Z79899 Other long term (current) drug therapy: Secondary | ICD-10-CM

## 2023-01-31 DIAGNOSIS — Z95811 Presence of heart assist device: Secondary | ICD-10-CM | POA: Diagnosis not present

## 2023-01-31 DIAGNOSIS — I5022 Chronic systolic (congestive) heart failure: Secondary | ICD-10-CM | POA: Insufficient documentation

## 2023-01-31 DIAGNOSIS — Z9889 Other specified postprocedural states: Secondary | ICD-10-CM | POA: Insufficient documentation

## 2023-01-31 DIAGNOSIS — E1122 Type 2 diabetes mellitus with diabetic chronic kidney disease: Secondary | ICD-10-CM | POA: Diagnosis not present

## 2023-01-31 DIAGNOSIS — E1136 Type 2 diabetes mellitus with diabetic cataract: Secondary | ICD-10-CM | POA: Insufficient documentation

## 2023-01-31 DIAGNOSIS — R110 Nausea: Secondary | ICD-10-CM | POA: Diagnosis not present

## 2023-01-31 DIAGNOSIS — D649 Anemia, unspecified: Secondary | ICD-10-CM | POA: Insufficient documentation

## 2023-01-31 DIAGNOSIS — Z7901 Long term (current) use of anticoagulants: Secondary | ICD-10-CM | POA: Diagnosis not present

## 2023-01-31 DIAGNOSIS — I472 Ventricular tachycardia, unspecified: Secondary | ICD-10-CM

## 2023-01-31 DIAGNOSIS — Z951 Presence of aortocoronary bypass graft: Secondary | ICD-10-CM | POA: Diagnosis not present

## 2023-01-31 DIAGNOSIS — N186 End stage renal disease: Secondary | ICD-10-CM | POA: Diagnosis not present

## 2023-01-31 DIAGNOSIS — I132 Hypertensive heart and chronic kidney disease with heart failure and with stage 5 chronic kidney disease, or end stage renal disease: Secondary | ICD-10-CM | POA: Diagnosis not present

## 2023-01-31 LAB — CBC
HCT: 35.5 % — ABNORMAL LOW (ref 39.0–52.0)
Hemoglobin: 11.3 g/dL — ABNORMAL LOW (ref 13.0–17.0)
MCH: 31 pg (ref 26.0–34.0)
MCHC: 31.8 g/dL (ref 30.0–36.0)
MCV: 97.5 fL (ref 80.0–100.0)
Platelets: 178 10*3/uL (ref 150–400)
RBC: 3.64 MIL/uL — ABNORMAL LOW (ref 4.22–5.81)
RDW: 15.4 % (ref 11.5–15.5)
WBC: 8.9 10*3/uL (ref 4.0–10.5)
nRBC: 0 % (ref 0.0–0.2)

## 2023-01-31 LAB — TSH: TSH: 2.351 u[IU]/mL (ref 0.350–4.500)

## 2023-01-31 LAB — BASIC METABOLIC PANEL
Anion gap: 16 — ABNORMAL HIGH (ref 5–15)
BUN: 34 mg/dL — ABNORMAL HIGH (ref 8–23)
CO2: 26 mmol/L (ref 22–32)
Calcium: 8.6 mg/dL — ABNORMAL LOW (ref 8.9–10.3)
Chloride: 96 mmol/L — ABNORMAL LOW (ref 98–111)
Creatinine, Ser: 5.74 mg/dL — ABNORMAL HIGH (ref 0.61–1.24)
GFR, Estimated: 10 mL/min — ABNORMAL LOW (ref >60)
Glucose, Bld: 173 mg/dL — ABNORMAL HIGH (ref 70–99)
Potassium: 4.5 mmol/L (ref 3.5–5.1)
Sodium: 138 mmol/L (ref 135–145)

## 2023-01-31 LAB — MAGNESIUM: Magnesium: 2 mg/dL (ref 1.7–2.4)

## 2023-01-31 LAB — PROTIME-INR
INR: 2.1 — ABNORMAL HIGH (ref 0.8–1.2)
Prothrombin Time: 23.9 seconds — ABNORMAL HIGH (ref 11.4–15.2)

## 2023-01-31 LAB — LACTATE DEHYDROGENASE: LDH: 219 U/L — ABNORMAL HIGH (ref 98–192)

## 2023-01-31 LAB — T4, FREE: Free T4: 1.1 ng/dL (ref 0.61–1.12)

## 2023-01-31 MED ORDER — AMIODARONE HCL 200 MG PO TABS
200.0000 mg | ORAL_TABLET | Freq: Every day | ORAL | 3 refills | Status: DC
Start: 2023-01-31 — End: 2023-04-22

## 2023-01-31 NOTE — Patient Instructions (Signed)
No medication changes ?Coumadin dosing per Lauren PharmD ?Return to [facility] clinic in 2 months  ?

## 2023-01-31 NOTE — Progress Notes (Addendum)
Patient presents for 1 month f/u VAD Clinic today with brother Marcus Johnson). Reports no problems with VAD equipment or concerns with drive line.  Patient arrived via wheelchair today. Pt denies falls, heart failure symptoms, or signs of bleeding. Reports intermittent lightheadedness/dizziness after HD. This resolves with rest.   Patient reports he continues M/W/F dialysis. Pt confirms he takes Midodrine TID on HD days. Reports taking Midodrine PRN on none HD days based off if he is lightheaded/dizzy or not.    Pt on Keflex 500 mg BID (dosed for HD schedule). Marcus Johnson reports they are changing dressing weekly. Dressing changed today in clinic. Currently wearing 2 anchors. Reports he wears binder at home, but does not like to wear during dialysis. See documentation below for dressing change details.   Reports feeling intermittent palpitations, especially when he is lying down. Completed Amiodarone 200 mg BID x 14 days on 6/20, and is now taking Amiodarone 200 mg. EKG obtained today- reviewed by Dr Shirlee Latch- SR 86. Will continue Amiodarone 200 mg daily at this time. Updated prescription sent to pt's pharmacy.   His ophthalmologist has referred him to [facility] for cataract surgery. Cleared for surgery per Dr Shirlee Latch. Recently started on Avastin eye drops per opthalmology.   Vital Signs:  Doppler Pressure: 118 Automatc BP: 111/92 (98) HR: 86 SR O2: UTO % RA   Weight: 200.4 lbs w/ eqt Last wt: 200.4 w/ eqt   VAD Indication: Destination Therapy due to CKD Stage 3b   LVAD assessment:  HM III: Speed: 5800 rpms Flow: 5.4 Power: 4.9 w    PI: 3.2 Alarms: none Events: 10-30 daily; 80+ on HD days  Fixed speed:  5800 Low speed limit:  5500  Primary controller: back up battery due for replacement in 29 months Secondary controller:  back up battery due for replacement in 9 months  I reviewed the LVAD parameters from today and compared the results to the patient's prior recorded data. LVAD interrogation  was NEGATIVE for significant power changes, NEGATIVE for clinical alarms and STABLE for PI events/speed drops. No programming changes were made and pump is functioning within specified parameters. Pt is performing daily controller and system monitor self tests along with completing weekly and monthly maintenance for LVAD equipment.   LVAD equipment check completed and is in good working order. Back-up equipment present.    Annual Equipment Maintenance on UBC/PM was performed 10/23/22.  Exit Site Care: Dressing maintained weekly by pt's brother. Gauze VAD dressing and anchor removed and site care performed using sterile technique. Drive line exit site cleaned with Chlora prep applicators x 2, allowed to dry before gauze dressing NO SILVER STRIP applied. Exit site incorporated. The velour is fully implanted at exit site. No drainage, redness, tenderness, or foul odor noted. Drive line anchor re-applied x 2. Pt denies fever or chills. May continue weekly dressing using daily kits. Pt given 25 anchors for at home use.  Device: Medtronic single Therapies: on VF 240 VT 182 (VT 150- monitor) Pacing: VVI 40 Last check: 01/08/23   BP & Labs:  Doppler 110 - Doppler is reflecting modified systolic   Hgb 11.3 - No S/S of bleeding. Specifically denies melena/BRBPR or nosebleeds.   LDH 219 - established baseline of 160 - 180. Denies tea-colored urine. No power elevations noted on interrogation.   Plan:  No medication changes Coumadin dosing per Lauren PharmD Return to VAD clinic in 2 months  Alyce Pagan RN VAD Coordinator  Office: [phone]  24/7 Pager: [phone]  Follow up for Heart Failure/LVAD:  Marcus Johnson. is a 62 y.o. male who has a h/o DM2, CAD s/p CABG 2002, systolic HF due to ischemic cardiomyopathy with EF 20-25% (echo 12/15), DM2 and CKD. He is s/p Medtronic single chamber ICD.   Admitted in 12/15 due to ADHF. Required short course milrinone for diuresis. Diuresed  30 pounds.    CPX 2/16 showed severely reduced functional capacity.  There was a significant disconnect between his symptoms and the CPX.  RHC in 6/16 showed fairly normal filling pressures and low but not markedly low cardiac output.    CPX (4/19) was submaximal but suggested severe limitation from HF.    He was admitted in 6/19 with marked volume overload and dyspnea.  Echo in 6/19 showed EF 20% with severe global hypokinesis.  LHC/RHC showed severe native vessel CAD with patent LIMA-D, SVG-OM, and SVG-PLV; cardiac index low.  He was started on milrinone and diuresed.  We discussed LVAD extensively in light of low output, creatinine that is trending up and concern for decompensation of RV over time. He was adamant that he did not want to pursue LVAD workup yet and did not want a referral for transplant evaluation. Milrinone was weaned off and he was discharged home.    Admitted 08/01/18-08/07/18 with volume overload and left foot wound. Diuresed 38 lbs with IV lasix and then transitioned back to torsemide 100 mg BID. ID was consulted with concerns for left foot osteomyelitis noted on CT scan. He got IV ancef q8 hours x 42 days. [facility] provided teaching and supplies for home antibiotics. Course complicated by AKI. DC weight 200 lbs.    He went into atrial flutter and had DCCV to NSR in 1/20.    Echo in 4/21 showed EF 25% with apical akinesis and diffuse hypokinesis relatively preserved in lateral wall, mildly decreased RV systolic function, PASP 41 mmHg. CPX in 5/21 showed severe functional limitation suggestive of advanced HF. 5/21 Zio patch showed short atrial flutter runs, 3.8% PVCs.    He developed COVID-19 infection in 1/22.  Symptoms were predominantly GI.    He developed progressive worsening of CHF in early 2022 and was admitted in 3/22 with low output HF.  Impella 5.5 was placed as bridge to Heartmate III LVAD placed in 10/13/20.  He developed post-op renal failure and ended up on dialysis.  He  was markedly deconditioned post-op and went to CIR.    Still has pain and minimal function in his right hand.  Nerve conduction study showed a chronic sensorimotor neuropathy in the right arm.    Ramp echo in 3/23 showed severe LV dysfunction with mild RV enlargement and mild dysfunction, mild-moderate AI.  Speed was increased to 5800 rpm.  The aortic valve still opened with every beat but only slightly. Flow increased from 4.1 to 5.3 L/min and PI remained stable.   Patient was admitted in 4/23 with falls and lightheadedness. He fractured his left foot when falling and initially was unable to walk.  He was thought to be hypotensive due to over-diuresis and dry weight was increased.  Midodrine on HD days was also increased.  He was treated for a driveline infection with vancomycin then doxycycline, now off.   ICD shock for VT in 7/23, not started on amiodarone given only 1 episode.   Echo in 3/24 showed EF 20-25% with midline IV septum, mild RV dysfunction with severe RVE, moderate AI.   In 5/24, patient had VT treated with  ATP.  He was started on amiodarone.   Patient returns today for followup of CHF/LVAD.  No further VT detected since starting amiodarone.  No dyspnea walking on flat ground.  Mild dyspnea walking up stairs.  No orthopnea/PND.  Gradually getting stronger.  No lightheadedness. Weight up 2 lbs.   I reviewed the LVAD parameters from today, and compared the results to the patient's prior recorded data.  No programming changes were made.  The LVAD is functioning within specified parameters.  The patient performs LVAD self-test daily.  LVAD interrogation was negative for any significant power changes, alarms or PI events/speed drops.  LVAD equipment check completed and is in good working order.  Back-up equipment present.   LVAD education done on emergency procedures and precautions and reviewed exit site care.  Labs (5/22): LDH 174, hgb 10.6 Labs (7/22): LDH 164 Labs (8/22): hgb  11.4 Labs (10/22): hgb 12 Labs (11/22): hgb 12.2 Labs (4/23): LDL 163, hgb 10.4 Labs (6/23): hgb 12.2, LDH 284 Labs (7/23): LFTs normal, hgb 11.3, LDH 235 Labs (9/23): hgb 10.2 Labs (1/24): hgb 10.6, LDH 279 Labs (3/24): LDL 222, hgb 11.2  ECG (personally reviewed): NSR, IVCD   Past Medical History:  Diagnosis Date   AICD (automatic cardioverter/defibrillator) present    Single-chamber  implantable cardiac defibrillator - Medtronic   Atrial fibrillation (HCC)    Cataract    Mixed form OU   CHF (congestive heart failure) (HCC)    Chronic kidney disease    Chronic kidney disease (CKD), stage III (moderate) (HCC)    Chronic systolic heart failure (HCC)    a. Echo 5/13: Mild LVE, mild LVH, EF 10%, anteroseptal, lateral, apical AK, mild MR, mild LAE, moderately reduced RVSF, mild RAE, PASP 60;  b. 07/2014 Echo: EF 20-2%, diff HK, AKI of antsep/apical/mid-apicalinferior, mod reduced RV.   Coronary artery disease    a. s/p CABG 2002 b. LHC 5/13:  dLM 80%, LAD subtotally occluded, pCFX occluded, pRCA 50%, mid? Occlusion with high take off of the PDA with 70% multiple lesions-not bypassed and supplies collaterals to LAD, LIMA-IM/ramus ok, S-OM ok, S-PLA branches ok. Medical therapy was recommended   COVID    Diabetic retinopathy (HCC)    NPDR OD, PDR OS   Dyspnea    Gout    "on daily RX" (01/08/2018)   Hypertension    Hypertensive retinopathy    OU   Ischemic cardiomyopathy    a. Echo 5/13: Mild LVE, mild LVH, EF 10%, anteroseptal, lateral, apical AK, mild MR, mild LAE, moderately reduced RVSF, mild RAE, PASP 60;  b. 01/2012 s/p MDT [id number] Protecta XT VR AICD;  c. 07/2014 Echo: EF 20-2%, diff HK, AKI of antsep/apical/mid-apicalinferior, mod reduced RV.   MRSA (methicillin resistant Staphylococcus aureus)    Status post right foot plantar deep infection with MRSA status post  I&D 10/2008   Myocardial infarction [facility])    "was told I'd had several before heart OR in 2002" (01/08/2018)    Noncompliance    Peripheral neuropathy    Retinopathy, diabetic, background (HCC)    Syncope    Type II diabetes mellitus (HCC)    requiring insulin    Vitreous hemorrhage, left (HCC)    and proliferative diabetic retinopathy    Current Outpatient Medications  Medication Sig Dispense Refill   atorvastatin (LIPITOR) 40 MG tablet Take 1 tablet (40 mg total) by mouth daily.     Blood Glucose Monitoring Suppl (ONE TOUCH ULTRA 2) w/Device KIT 1 Device by  Does not apply route 3 (three) times daily. 1 kit 0   cephALEXin (KEFLEX) 500 MG capsule Take 1 capsule (500 mg total) by mouth 2 (two) times daily. Take evening dose after dialysis on HD days. 120 capsule 1   gabapentin (NEURONTIN) 100 MG capsule TAKE 1 CAPSULE ( 100 MG) IN THE MORNING AND 2 CAPSULES ( 200 MG) AT BEDTIME 90 capsule 3   glucose blood (ACCU-CHEK GUIDE) test strip USE AS INSTRUCTED 3 TIMES DAILY 100 strip 1   insulin glargine (LANTUS SOLOSTAR) 100 UNIT/ML Solostar Pen Inject 60 Units into the skin at bedtime. (Patient taking differently: Inject 60 Units into the skin daily as needed.) 30 mL 11   Insulin Pen Needle 32G X 4 MM MISC Use to inject insulin daily 100 each 0   midodrine (PROAMATINE) 10 MG tablet Take 2 tablets (20 mg total) by mouth 3 (three) times daily. Three times a day on dialysis days; NO midodrine on NON dialysis days 160 tablet 11   multivitamin (RENA-VIT) TABS tablet TAKE 1 TABLET BY MOUTH EVERYDAY AT BEDTIME 90 tablet 3   naphazoline-glycerin (CLEAR EYES REDNESS) 0.012-0.25 % SOLN Place 1-2 drops into the right eye 4 (four) times daily as needed for eye irritation.     OneTouch UltraSoft 2 Lancets MISC 1 each by Does not apply route 3 (three) times daily before meals. 100 each 1   pantoprazole (PROTONIX) 40 MG tablet TAKE 1 TABLET BY MOUTH EVERY DAY 90 tablet 3   rosuvastatin (CRESTOR) 10 MG tablet TAKE 1 TABLET BY MOUTH EVERY DAY 90 tablet 1   sevelamer (RENAGEL) 800 MG tablet Take 800 mg by mouth 3 (three)  times daily.     warfarin (COUMADIN) 3 MG tablet TAKE 4.5 MG (1.5 TABS) DAILY OR AS DIRECTED BY HF CLINIC 150 tablet 3   acetaminophen (TYLENOL) 325 MG tablet Take by mouth. (Patient not taking: Reported on 01/31/2023)     allopurinol (ZYLOPRIM) 300 MG tablet Take by mouth. (Patient not taking: Reported on 01/31/2023)     amiodarone (PACERONE) 200 MG tablet Take 1 tablet (200 mg total) by mouth daily. Take 1 tablet (200 mg) by mouth twice daily for 2 weeks, then decrease to 1 tablet (200 mg) daily 90 tablet 3   ondansetron (ZOFRAN) 4 MG tablet Take 1 tablet (4 mg total) by mouth every 8 (eight) hours as needed for nausea or vomiting. (Patient not taking: Reported on 01/31/2023) 30 tablet 3   polyethylene glycol powder (GLYCOLAX/MIRALAX) 17 GM/SCOOP powder  (Patient not taking: Reported on 01/31/2023)     traMADol (ULTRAM) 50 MG tablet Take 1 tablet (50 mg total) by mouth every 12 (twelve) hours as needed. (Patient not taking: Reported on 01/31/2023) 30 tablet 0   No current facility-administered medications for this encounter.    Meclizine hcl and Ivabradine  REVIEW OF SYSTEMS: All systems negative except as listed in HPI, PMH and Problem list.   LVAD INTERROGATION:  Please see LVAD nurse's note above.   MAP 98  Physical Exam: General: Well appearing this am. NAD.  HEENT: Normal. Neck: Supple, JVP 8 cm. Carotids OK.  Cardiac:  Mechanical heart sounds with LVAD hum present.  Lungs:  CTAB, normal effort.  Abdomen:  NT, ND, no HSM. No bruits or masses. +BS  LVAD exit site: Well-healed and incorporated. Dressing dry and intact. No erythema or drainage. Stabilization device present and accurately applied. Driveline dressing changed daily per sterile technique. Extremities:  Warm and dry. No cyanosis, clubbing,  rash, or edema.  Neuro:  Alert & oriented x 3. Cranial nerves grossly intact. Moves all 4 extremities w/o difficulty. Affect pleasant    ASSESSMENT AND PLAN:  1. Chronic systolic CHF:   Ischemic cardiomyopathy. Has Medtronic ICD. Low output HF, admitted and Impella 5.5 placed 10/04/20.  HM3 LVAD was placed on 10/13/20. Developed post-op renal failure requiring eventual hemodialysis.  Ramp echo in 3/23, speed increased to 5800 rpm under echo guidance with increase in flow and stable PI.  Echo in 3/24 showed EF 20-25% with midline IV septum, mild RV dysfunction with severe RVE, moderate AI. MAP mildly elevated today (98) but drops on HD days without midodrine.  He is not volume overloaded.  - Continue midodrine on HD days.   2. LVAD:  VAD interrogated personally.  Parameters stable with PI events on HD days. No low flow events.  - He is now off ASA.  - Continue warfarin with INR goal 2-2.5.   3. CAD s/p CABG 2002:  Last cath in 6/19 with patent grafts. No chest pain.  - Continue Crestor.  4. ESRD: Currently dialyzing via tunneled catheter. No low flow events. He has had some nausea after dialysis.  - Continue HD M-W-F  - With continuous flow LVAD, think AV fistula would be unlikely to mature.   - Can use Zofran with nausea.  5. Atrial flutter/fibrillation: s/p DC-CV 08/21/18.  Has occasional episodes of AF but not prolonged.  - Continue amiodarone.  6. 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 This has been improving though not completely normal.   7. DM: Per primary care.  8. Anemia: CBC today.  9. Suspected Gastroparesis: He is no longer taking Reglan.  10. VT: Episode in 7/23 with successful ICD discharge.  No definite trigger, he did not actually feel the VT. VT again in 5/24 treated with ATP, amiodarone started.    - Continue amiodarone.  Check LFTs and TSH, will need regular eye exam.  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 Driveline site looks good today.   Followup in 2 months.   Marca Ancona 01/31/2023

## 2023-01-31 NOTE — Progress Notes (Signed)
Remote ICD transmission.   

## 2023-02-01 DIAGNOSIS — N2581 Secondary hyperparathyroidism of renal origin: Secondary | ICD-10-CM | POA: Diagnosis not present

## 2023-02-01 DIAGNOSIS — N186 End stage renal disease: Secondary | ICD-10-CM | POA: Diagnosis not present

## 2023-02-01 DIAGNOSIS — Z992 Dependence on renal dialysis: Secondary | ICD-10-CM | POA: Diagnosis not present

## 2023-02-01 LAB — T3, FREE: T3, Free: 1.9 pg/mL — ABNORMAL LOW (ref 2.0–4.4)

## 2023-02-03 DIAGNOSIS — N186 End stage renal disease: Secondary | ICD-10-CM | POA: Diagnosis not present

## 2023-02-03 DIAGNOSIS — Z992 Dependence on renal dialysis: Secondary | ICD-10-CM | POA: Diagnosis not present

## 2023-02-03 DIAGNOSIS — I509 Heart failure, unspecified: Secondary | ICD-10-CM | POA: Diagnosis not present

## 2023-02-04 DIAGNOSIS — N186 End stage renal disease: Secondary | ICD-10-CM | POA: Diagnosis not present

## 2023-02-04 DIAGNOSIS — Z992 Dependence on renal dialysis: Secondary | ICD-10-CM | POA: Diagnosis not present

## 2023-02-04 DIAGNOSIS — N2581 Secondary hyperparathyroidism of renal origin: Secondary | ICD-10-CM | POA: Diagnosis not present

## 2023-02-05 ENCOUNTER — Ambulatory Visit: Payer: Medicare HMO | Attending: Internal Medicine | Admitting: Internal Medicine

## 2023-02-05 ENCOUNTER — Ambulatory Visit: Payer: Medicare HMO | Admitting: Internal Medicine

## 2023-02-05 ENCOUNTER — Encounter: Payer: Self-pay | Admitting: Internal Medicine

## 2023-02-05 ENCOUNTER — Ambulatory Visit (HOSPITAL_COMMUNITY): Payer: Self-pay | Admitting: Pharmacist

## 2023-02-05 VITALS — BP 120/0 | HR 81 | Ht 70.0 in | Wt 200.0 lb

## 2023-02-05 DIAGNOSIS — Z95810 Presence of automatic (implantable) cardiac defibrillator: Secondary | ICD-10-CM

## 2023-02-05 DIAGNOSIS — I255 Ischemic cardiomyopathy: Secondary | ICD-10-CM

## 2023-02-05 DIAGNOSIS — Z95811 Presence of heart assist device: Secondary | ICD-10-CM

## 2023-02-05 DIAGNOSIS — I472 Ventricular tachycardia, unspecified: Secondary | ICD-10-CM

## 2023-02-05 DIAGNOSIS — I5022 Chronic systolic (congestive) heart failure: Secondary | ICD-10-CM

## 2023-02-05 LAB — CUP PACEART INCLINIC DEVICE CHECK
Battery Remaining Longevity: 120 mo
Battery Voltage: 3.02 V
Brady Statistic RV Percent Paced: 0.01 %
Date Time Interrogation Session: 20240702202051
HighPow Impedance: 35 Ohm
HighPow Impedance: 43 Ohm
Implantable Lead Connection Status: 753985
Implantable Lead Implant Date: 20130603
Implantable Lead Location: 753860
Implantable Lead Model: 6947
Implantable Pulse Generator Implant Date: 20211129
Lead Channel Impedance Value: 342 Ohm
Lead Channel Impedance Value: 608 Ohm
Lead Channel Pacing Threshold Amplitude: 0.75 V
Lead Channel Pacing Threshold Amplitude: 1.375 V
Lead Channel Pacing Threshold Pulse Width: 0.4 ms
Lead Channel Pacing Threshold Pulse Width: 0.4 ms
Lead Channel Sensing Intrinsic Amplitude: 4.875 mV
Lead Channel Sensing Intrinsic Amplitude: 5.25 mV
Lead Channel Setting Pacing Amplitude: 3.5 V
Lead Channel Setting Pacing Pulse Width: 0.4 ms
Lead Channel Setting Sensing Sensitivity: 0.3 mV
Zone Setting Status: 755011
Zone Setting Status: 755011

## 2023-02-05 LAB — POCT INR: INR: 2.5 (ref 2.0–3.0)

## 2023-02-05 NOTE — Patient Instructions (Signed)
Medication Instructions:  Your physician recommends that you continue on your current medications as directed. Please refer to the Current Medication list given to you today.  *If you need a refill on your cardiac medications before your next appointment, please call your pharmacy*   Lab Work: None ordered.  If you have labs (blood work) drawn today and your tests are completely normal, you will receive your results only by: MyChart Message (if you have MyChart) OR A paper copy in the mail If you have any lab test that is abnormal or we need to change your treatment, we will call you to review the results.   Testing/Procedures: None ordered.    Follow-Up: At [facility], you and your health needs are our priority.  As part of our continuing mission to provide you with exceptional heart care, we have created designated Provider Care Teams.  These Care Teams include your primary Cardiologist (physician) and Advanced Practice Providers (APPs -  Physician Assistants and Nurse Practitioners) who all work together to provide you with the care you need, when you need it.  We recommend signing up for the patient portal called "MyChart".  Sign up information is provided on this After Visit Summary.  MyChart is used to connect with patients for Virtual Visits (Telemedicine).  Patients are able to view lab/test results, encounter notes, upcoming appointments, etc.  Non-urgent messages can be sent to your provider as well.   To learn more about what you can do with MyChart, go to [email or web address].    Your next appointment:   Follow up with Dr Klein as needed 
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
## 2023-02-06 DIAGNOSIS — N186 End stage renal disease: Secondary | ICD-10-CM | POA: Diagnosis not present

## 2023-02-06 DIAGNOSIS — N2581 Secondary hyperparathyroidism of renal origin: Secondary | ICD-10-CM | POA: Diagnosis not present

## 2023-02-06 DIAGNOSIS — Z992 Dependence on renal dialysis: Secondary | ICD-10-CM | POA: Diagnosis not present

## 2023-02-08 DIAGNOSIS — Z992 Dependence on renal dialysis: Secondary | ICD-10-CM | POA: Diagnosis not present

## 2023-02-08 DIAGNOSIS — N2581 Secondary hyperparathyroidism of renal origin: Secondary | ICD-10-CM | POA: Diagnosis not present

## 2023-02-08 DIAGNOSIS — N186 End stage renal disease: Secondary | ICD-10-CM | POA: Diagnosis not present

## 2023-02-11 DIAGNOSIS — N2581 Secondary hyperparathyroidism of renal origin: Secondary | ICD-10-CM | POA: Diagnosis not present

## 2023-02-11 DIAGNOSIS — Z992 Dependence on renal dialysis: Secondary | ICD-10-CM | POA: Diagnosis not present

## 2023-02-11 DIAGNOSIS — N186 End stage renal disease: Secondary | ICD-10-CM | POA: Diagnosis not present

## 2023-02-12 ENCOUNTER — Ambulatory Visit (HOSPITAL_COMMUNITY): Payer: Self-pay | Admitting: Pharmacist

## 2023-02-12 LAB — POCT INR: INR: 2.5 (ref 2.0–3.0)

## 2023-02-13 DIAGNOSIS — Z992 Dependence on renal dialysis: Secondary | ICD-10-CM | POA: Diagnosis not present

## 2023-02-13 DIAGNOSIS — N2581 Secondary hyperparathyroidism of renal origin: Secondary | ICD-10-CM | POA: Diagnosis not present

## 2023-02-13 DIAGNOSIS — N186 End stage renal disease: Secondary | ICD-10-CM | POA: Diagnosis not present

## 2023-02-15 DIAGNOSIS — N186 End stage renal disease: Secondary | ICD-10-CM | POA: Diagnosis not present

## 2023-02-15 DIAGNOSIS — Z992 Dependence on renal dialysis: Secondary | ICD-10-CM | POA: Diagnosis not present

## 2023-02-15 DIAGNOSIS — N2581 Secondary hyperparathyroidism of renal origin: Secondary | ICD-10-CM | POA: Diagnosis not present
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
## 2023-02-18 DIAGNOSIS — Z992 Dependence on renal dialysis: Secondary | ICD-10-CM | POA: Diagnosis not present

## 2023-02-18 DIAGNOSIS — N2581 Secondary hyperparathyroidism of renal origin: Secondary | ICD-10-CM | POA: Diagnosis not present

## 2023-02-18 DIAGNOSIS — N186 End stage renal disease: Secondary | ICD-10-CM | POA: Diagnosis not present

## 2023-02-19 ENCOUNTER — Ambulatory Visit (HOSPITAL_COMMUNITY): Payer: Self-pay | Admitting: Pharmacist

## 2023-02-19 LAB — POCT INR: INR: 3.3 — AB (ref 2.0–3.0)

## 2023-02-20 DIAGNOSIS — N2581 Secondary hyperparathyroidism of renal origin: Secondary | ICD-10-CM | POA: Diagnosis not present

## 2023-02-20 DIAGNOSIS — N186 End stage renal disease: Secondary | ICD-10-CM | POA: Diagnosis not present

## 2023-02-20 DIAGNOSIS — Z992 Dependence on renal dialysis: Secondary | ICD-10-CM | POA: Diagnosis not present

## 2023-02-22 DIAGNOSIS — N2581 Secondary hyperparathyroidism of renal origin: Secondary | ICD-10-CM | POA: Diagnosis not present

## 2023-02-22 DIAGNOSIS — N186 End stage renal disease: Secondary | ICD-10-CM | POA: Diagnosis not present

## 2023-02-22 DIAGNOSIS — Z992 Dependence on renal dialysis: Secondary | ICD-10-CM | POA: Diagnosis not present

## 2023-02-25 DIAGNOSIS — N2581 Secondary hyperparathyroidism of renal origin: Secondary | ICD-10-CM | POA: Diagnosis not present

## 2023-02-25 DIAGNOSIS — N186 End stage renal disease: Secondary | ICD-10-CM | POA: Diagnosis not present

## 2023-02-25 DIAGNOSIS — Z992 Dependence on renal dialysis: Secondary | ICD-10-CM | POA: Diagnosis not present

## 2023-02-26 ENCOUNTER — Encounter (HOSPITAL_COMMUNITY): Payer: Medicare HMO | Admitting: Cardiology

## 2023-02-26 ENCOUNTER — Ambulatory Visit (HOSPITAL_COMMUNITY): Payer: Self-pay | Admitting: Pharmacist

## 2023-02-26 DIAGNOSIS — Z7901 Long term (current) use of anticoagulants: Secondary | ICD-10-CM | POA: Diagnosis not present

## 2023-02-26 LAB — POCT INR: INR: 2.3 (ref 2.0–3.0)

## 2023-02-27 DIAGNOSIS — N2581 Secondary hyperparathyroidism of renal origin: Secondary | ICD-10-CM | POA: Diagnosis not present

## 2023-02-27 DIAGNOSIS — Z992 Dependence on renal dialysis: Secondary | ICD-10-CM | POA: Diagnosis not present

## 2023-02-27 DIAGNOSIS — N186 End stage renal disease: Secondary | ICD-10-CM | POA: Diagnosis not present

## 2023-02-28 ENCOUNTER — Ambulatory Visit (INDEPENDENT_AMBULATORY_CARE_PROVIDER_SITE_OTHER): Payer: Medicare HMO | Admitting: Ophthalmology

## 2023-02-28 ENCOUNTER — Encounter (INDEPENDENT_AMBULATORY_CARE_PROVIDER_SITE_OTHER): Payer: Self-pay | Admitting: Ophthalmology

## 2023-02-28 DIAGNOSIS — H25813 Combined forms of age-related cataract, bilateral: Secondary | ICD-10-CM | POA: Diagnosis not present

## 2023-02-28 DIAGNOSIS — H35033 Hypertensive retinopathy, bilateral: Secondary | ICD-10-CM | POA: Diagnosis not present

## 2023-02-28 DIAGNOSIS — I10 Essential (primary) hypertension: Secondary | ICD-10-CM

## 2023-02-28 DIAGNOSIS — Z794 Long term (current) use of insulin: Secondary | ICD-10-CM

## 2023-02-28 DIAGNOSIS — H4312 Vitreous hemorrhage, left eye: Secondary | ICD-10-CM

## 2023-02-28 DIAGNOSIS — E113513 Type 2 diabetes mellitus with proliferative diabetic retinopathy with macular edema, bilateral: Secondary | ICD-10-CM | POA: Diagnosis not present

## 2023-02-28 MED ORDER — AFLIBERCEPT 2MG/0.05ML IZ SOLN FOR KALEIDOSCOPE
2.0000 mg | INTRAVITREAL | Status: AC | PRN
Start: 2023-02-28 — End: 2023-02-28
  Administered 2023-02-28: 2 mg via INTRAVITREAL

## 2023-03-01 DIAGNOSIS — Z992 Dependence on renal dialysis: Secondary | ICD-10-CM | POA: Diagnosis not present

## 2023-03-01 DIAGNOSIS — N186 End stage renal disease: Secondary | ICD-10-CM | POA: Diagnosis not present

## 2023-03-01 DIAGNOSIS — N2581 Secondary hyperparathyroidism of renal origin: Secondary | ICD-10-CM | POA: Diagnosis not present

## 2023-03-04 DIAGNOSIS — Z992 Dependence on renal dialysis: Secondary | ICD-10-CM | POA: Diagnosis not present

## 2023-03-04 DIAGNOSIS — N186 End stage renal disease: Secondary | ICD-10-CM | POA: Diagnosis not present

## 2023-03-04 DIAGNOSIS — N2581 Secondary hyperparathyroidism of renal origin: Secondary | ICD-10-CM | POA: Diagnosis not present

## 2023-03-05 ENCOUNTER — Ambulatory Visit (HOSPITAL_COMMUNITY): Payer: Self-pay | Admitting: Student-PharmD

## 2023-03-05 LAB — POCT INR: INR: 2.3 (ref 2.0–3.0)

## 2023-03-06 DIAGNOSIS — N186 End stage renal disease: Secondary | ICD-10-CM | POA: Diagnosis not present

## 2023-03-06 DIAGNOSIS — I509 Heart failure, unspecified: Secondary | ICD-10-CM | POA: Diagnosis not present

## 2023-03-06 DIAGNOSIS — N2581 Secondary hyperparathyroidism of renal origin: Secondary | ICD-10-CM | POA: Diagnosis not present

## 2023-03-06 DIAGNOSIS — Z992 Dependence on renal dialysis: Secondary | ICD-10-CM | POA: Diagnosis not present

## 2023-03-08 DIAGNOSIS — Z992 Dependence on renal dialysis: Secondary | ICD-10-CM | POA: Diagnosis not present

## 2023-03-08 DIAGNOSIS — N2581 Secondary hyperparathyroidism of renal origin: Secondary | ICD-10-CM | POA: Diagnosis not present

## 2023-03-08 DIAGNOSIS — N186 End stage renal disease: Secondary | ICD-10-CM | POA: Diagnosis not present

## 2023-03-11 DIAGNOSIS — N2581 Secondary hyperparathyroidism of renal origin: Secondary | ICD-10-CM | POA: Diagnosis not present

## 2023-03-11 DIAGNOSIS — Z992 Dependence on renal dialysis: Secondary | ICD-10-CM | POA: Diagnosis not present

## 2023-03-11 DIAGNOSIS — N186 End stage renal disease: Secondary | ICD-10-CM | POA: Diagnosis not present

## 2023-03-12 ENCOUNTER — Ambulatory Visit (HOSPITAL_COMMUNITY): Payer: Self-pay | Admitting: Pharmacist

## 2023-03-12 LAB — POCT INR: INR: 2.2 (ref 2.0–3.0)

## 2023-03-13 DIAGNOSIS — N2581 Secondary hyperparathyroidism of renal origin: Secondary | ICD-10-CM | POA: Diagnosis not present

## 2023-03-13 DIAGNOSIS — N186 End stage renal disease: Secondary | ICD-10-CM | POA: Diagnosis not present

## 2023-03-13 DIAGNOSIS — Z992 Dependence on renal dialysis: Secondary | ICD-10-CM | POA: Diagnosis not present

## 2023-03-15 DIAGNOSIS — N186 End stage renal disease: Secondary | ICD-10-CM | POA: Diagnosis not present

## 2023-03-15 DIAGNOSIS — N2581 Secondary hyperparathyroidism of renal origin: Secondary | ICD-10-CM | POA: Diagnosis not present

## 2023-03-15 DIAGNOSIS — Z992 Dependence on renal dialysis: Secondary | ICD-10-CM | POA: Diagnosis not present

## 2023-03-18 DIAGNOSIS — N186 End stage renal disease: Secondary | ICD-10-CM | POA: Diagnosis not present

## 2023-03-18 DIAGNOSIS — Z992 Dependence on renal dialysis: Secondary | ICD-10-CM | POA: Diagnosis not present

## 2023-03-18 DIAGNOSIS — N2581 Secondary hyperparathyroidism of renal origin: Secondary | ICD-10-CM | POA: Diagnosis not present

## 2023-03-19 ENCOUNTER — Ambulatory Visit (HOSPITAL_COMMUNITY): Payer: Self-pay | Admitting: Pharmacist

## 2023-03-19 LAB — POCT INR: INR: 3.1 — AB (ref 2.0–3.0)

## 2023-03-20 DIAGNOSIS — N2581 Secondary hyperparathyroidism of renal origin: Secondary | ICD-10-CM | POA: Diagnosis not present

## 2023-03-20 DIAGNOSIS — Z992 Dependence on renal dialysis: Secondary | ICD-10-CM | POA: Diagnosis not present

## 2023-03-20 DIAGNOSIS — N186 End stage renal disease: Secondary | ICD-10-CM | POA: Diagnosis not present
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
## 2023-03-22 DIAGNOSIS — Z992 Dependence on renal dialysis: Secondary | ICD-10-CM | POA: Diagnosis not present

## 2023-03-22 DIAGNOSIS — N186 End stage renal disease: Secondary | ICD-10-CM | POA: Diagnosis not present

## 2023-03-22 DIAGNOSIS — N2581 Secondary hyperparathyroidism of renal origin: Secondary | ICD-10-CM | POA: Diagnosis not present

## 2023-03-25 DIAGNOSIS — N186 End stage renal disease: Secondary | ICD-10-CM | POA: Diagnosis not present

## 2023-03-25 DIAGNOSIS — Z992 Dependence on renal dialysis: Secondary | ICD-10-CM | POA: Diagnosis not present

## 2023-03-25 DIAGNOSIS — N2581 Secondary hyperparathyroidism of renal origin: Secondary | ICD-10-CM | POA: Diagnosis not present

## 2023-03-26 ENCOUNTER — Other Ambulatory Visit: Payer: Self-pay | Admitting: Family Medicine

## 2023-03-26 ENCOUNTER — Ambulatory Visit (HOSPITAL_COMMUNITY): Payer: Self-pay | Admitting: Pharmacist

## 2023-03-26 DIAGNOSIS — Z7901 Long term (current) use of anticoagulants: Secondary | ICD-10-CM | POA: Diagnosis not present

## 2023-03-26 LAB — POCT INR: INR: 2.4 (ref 2.0–3.0)

## 2023-03-27 DIAGNOSIS — Z992 Dependence on renal dialysis: Secondary | ICD-10-CM | POA: Diagnosis not present

## 2023-03-27 DIAGNOSIS — N186 End stage renal disease: Secondary | ICD-10-CM | POA: Diagnosis not present

## 2023-03-27 DIAGNOSIS — N2581 Secondary hyperparathyroidism of renal origin: Secondary | ICD-10-CM | POA: Diagnosis not present

## 2023-03-29 DIAGNOSIS — Z992 Dependence on renal dialysis: Secondary | ICD-10-CM | POA: Diagnosis not present

## 2023-03-29 DIAGNOSIS — N186 End stage renal disease: Secondary | ICD-10-CM | POA: Diagnosis not present

## 2023-03-29 DIAGNOSIS — N2581 Secondary hyperparathyroidism of renal origin: Secondary | ICD-10-CM | POA: Diagnosis not present

## 2023-04-01 DIAGNOSIS — N186 End stage renal disease: Secondary | ICD-10-CM | POA: Diagnosis not present

## 2023-04-01 DIAGNOSIS — N2581 Secondary hyperparathyroidism of renal origin: Secondary | ICD-10-CM | POA: Diagnosis not present

## 2023-04-01 DIAGNOSIS — Z992 Dependence on renal dialysis: Secondary | ICD-10-CM | POA: Diagnosis not present

## 2023-04-02 ENCOUNTER — Ambulatory Visit (HOSPITAL_COMMUNITY): Payer: Self-pay | Admitting: Pharmacist

## 2023-04-02 LAB — POCT INR: INR: 2.4 (ref 2.0–3.0)

## 2023-04-03 DIAGNOSIS — N2581 Secondary hyperparathyroidism of renal origin: Secondary | ICD-10-CM | POA: Diagnosis not present

## 2023-04-03 DIAGNOSIS — Z992 Dependence on renal dialysis: Secondary | ICD-10-CM | POA: Diagnosis not present

## 2023-04-03 DIAGNOSIS — N186 End stage renal disease: Secondary | ICD-10-CM | POA: Diagnosis not present

## 2023-04-04 ENCOUNTER — Encounter (INDEPENDENT_AMBULATORY_CARE_PROVIDER_SITE_OTHER): Payer: Self-pay | Admitting: Ophthalmology

## 2023-04-04 ENCOUNTER — Ambulatory Visit (INDEPENDENT_AMBULATORY_CARE_PROVIDER_SITE_OTHER): Payer: Medicare HMO | Admitting: Ophthalmology

## 2023-04-04 DIAGNOSIS — I10 Essential (primary) hypertension: Secondary | ICD-10-CM | POA: Diagnosis not present

## 2023-04-04 DIAGNOSIS — H4312 Vitreous hemorrhage, left eye: Secondary | ICD-10-CM

## 2023-04-04 DIAGNOSIS — E113513 Type 2 diabetes mellitus with proliferative diabetic retinopathy with macular edema, bilateral: Secondary | ICD-10-CM

## 2023-04-04 DIAGNOSIS — Z794 Long term (current) use of insulin: Secondary | ICD-10-CM

## 2023-04-04 DIAGNOSIS — H25813 Combined forms of age-related cataract, bilateral: Secondary | ICD-10-CM

## 2023-04-04 DIAGNOSIS — H35033 Hypertensive retinopathy, bilateral: Secondary | ICD-10-CM

## 2023-04-04 MED ORDER — AFLIBERCEPT 2MG/0.05ML IZ SOLN FOR KALEIDOSCOPE
2.0000 mg | INTRAVITREAL | Status: AC | PRN
Start: 2023-04-04 — End: 2023-04-04
  Administered 2023-04-04: 2 mg via INTRAVITREAL

## 2023-04-05 ENCOUNTER — Other Ambulatory Visit (HOSPITAL_COMMUNITY): Payer: Self-pay | Admitting: *Deleted

## 2023-04-05 DIAGNOSIS — N2581 Secondary hyperparathyroidism of renal origin: Secondary | ICD-10-CM | POA: Diagnosis not present

## 2023-04-05 DIAGNOSIS — Z7901 Long term (current) use of anticoagulants: Secondary | ICD-10-CM

## 2023-04-05 DIAGNOSIS — N186 End stage renal disease: Secondary | ICD-10-CM | POA: Diagnosis not present

## 2023-04-05 DIAGNOSIS — Z992 Dependence on renal dialysis: Secondary | ICD-10-CM | POA: Diagnosis not present

## 2023-04-05 DIAGNOSIS — Z95811 Presence of heart assist device: Secondary | ICD-10-CM

## 2023-04-05 DIAGNOSIS — Z79899 Other long term (current) drug therapy: Secondary | ICD-10-CM

## 2023-04-05 DIAGNOSIS — I5042 Chronic combined systolic (congestive) and diastolic (congestive) heart failure: Secondary | ICD-10-CM

## 2023-04-06 DIAGNOSIS — Z992 Dependence on renal dialysis: Secondary | ICD-10-CM | POA: Diagnosis not present

## 2023-04-06 DIAGNOSIS — N186 End stage renal disease: Secondary | ICD-10-CM | POA: Diagnosis not present

## 2023-04-06 DIAGNOSIS — I509 Heart failure, unspecified: Secondary | ICD-10-CM | POA: Diagnosis not present

## 2023-04-08 DIAGNOSIS — Z992 Dependence on renal dialysis: Secondary | ICD-10-CM | POA: Diagnosis not present

## 2023-04-08 DIAGNOSIS — N2581 Secondary hyperparathyroidism of renal origin: Secondary | ICD-10-CM | POA: Diagnosis not present

## 2023-04-08 DIAGNOSIS — N186 End stage renal disease: Secondary | ICD-10-CM | POA: Diagnosis not present

## 2023-04-09 ENCOUNTER — Ambulatory Visit (HOSPITAL_COMMUNITY): Payer: Self-pay | Admitting: Pharmacist

## 2023-04-09 ENCOUNTER — Ambulatory Visit (HOSPITAL_COMMUNITY)
Admission: RE | Admit: 2023-04-09 | Discharge: 2023-04-09 | Disposition: A | Discharge status: Home / Self Care | Payer: Medicare HMO | Source: Ambulatory Visit | Attending: Cardiology | Admitting: Cardiology

## 2023-04-09 ENCOUNTER — Ambulatory Visit (INDEPENDENT_AMBULATORY_CARE_PROVIDER_SITE_OTHER): Payer: Medicare HMO

## 2023-04-09 DIAGNOSIS — M6281 Muscle weakness (generalized): Secondary | ICD-10-CM | POA: Diagnosis not present

## 2023-04-09 DIAGNOSIS — I5042 Chronic combined systolic (congestive) and diastolic (congestive) heart failure: Secondary | ICD-10-CM | POA: Insufficient documentation

## 2023-04-09 DIAGNOSIS — Z79899 Other long term (current) drug therapy: Secondary | ICD-10-CM | POA: Insufficient documentation

## 2023-04-09 DIAGNOSIS — D631 Anemia in chronic kidney disease: Secondary | ICD-10-CM | POA: Diagnosis not present

## 2023-04-09 DIAGNOSIS — T827XXD Infection and inflammatory reaction due to other cardiac and vascular devices, implants and grafts, subsequent encounter: Secondary | ICD-10-CM | POA: Insufficient documentation

## 2023-04-09 DIAGNOSIS — I255 Ischemic cardiomyopathy: Secondary | ICD-10-CM

## 2023-04-09 DIAGNOSIS — M79641 Pain in right hand: Secondary | ICD-10-CM | POA: Insufficient documentation

## 2023-04-09 DIAGNOSIS — Z951 Presence of aortocoronary bypass graft: Secondary | ICD-10-CM | POA: Insufficient documentation

## 2023-04-09 DIAGNOSIS — Z792 Long term (current) use of antibiotics: Secondary | ICD-10-CM | POA: Insufficient documentation

## 2023-04-09 DIAGNOSIS — Z7901 Long term (current) use of anticoagulants: Secondary | ICD-10-CM | POA: Insufficient documentation

## 2023-04-09 DIAGNOSIS — I2510 Atherosclerotic heart disease of native coronary artery without angina pectoris: Secondary | ICD-10-CM | POA: Diagnosis not present

## 2023-04-09 DIAGNOSIS — Z95810 Presence of automatic (implantable) cardiac defibrillator: Secondary | ICD-10-CM | POA: Insufficient documentation

## 2023-04-09 DIAGNOSIS — Z992 Dependence on renal dialysis: Secondary | ICD-10-CM | POA: Diagnosis not present

## 2023-04-09 DIAGNOSIS — I472 Ventricular tachycardia, unspecified: Secondary | ICD-10-CM | POA: Diagnosis not present

## 2023-04-09 DIAGNOSIS — Z95811 Presence of heart assist device: Secondary | ICD-10-CM | POA: Diagnosis not present

## 2023-04-09 DIAGNOSIS — I5022 Chronic systolic (congestive) heart failure: Secondary | ICD-10-CM

## 2023-04-09 DIAGNOSIS — N186 End stage renal disease: Secondary | ICD-10-CM | POA: Diagnosis not present

## 2023-04-09 DIAGNOSIS — B9561 Methicillin susceptible Staphylococcus aureus infection as the cause of diseases classified elsewhere: Secondary | ICD-10-CM | POA: Insufficient documentation

## 2023-04-09 DIAGNOSIS — I4892 Unspecified atrial flutter: Secondary | ICD-10-CM | POA: Insufficient documentation

## 2023-04-09 DIAGNOSIS — E1122 Type 2 diabetes mellitus with diabetic chronic kidney disease: Secondary | ICD-10-CM | POA: Insufficient documentation

## 2023-04-09 DIAGNOSIS — I428 Other cardiomyopathies: Secondary | ICD-10-CM | POA: Diagnosis not present

## 2023-04-09 LAB — POCT INR: INR: 2.7 (ref 2.0–3.0)

## 2023-04-09 LAB — CBC
HCT: 36.9 % — ABNORMAL LOW (ref 39.0–52.0)
Hemoglobin: 11.5 g/dL — ABNORMAL LOW (ref 13.0–17.0)
MCH: 30.1 pg (ref 26.0–34.0)
MCHC: 31.2 g/dL (ref 30.0–36.0)
MCV: 96.6 fL (ref 80.0–100.0)
Platelets: 224 10*3/uL (ref 150–400)
RBC: 3.82 MIL/uL — ABNORMAL LOW (ref 4.22–5.81)
RDW: 18 % — ABNORMAL HIGH (ref 11.5–15.5)
WBC: 7.8 10*3/uL (ref 4.0–10.5)
nRBC: 0.5 % — ABNORMAL HIGH (ref 0.0–0.2)

## 2023-04-09 LAB — TSH: TSH: 1.847 u[IU]/mL (ref 0.350–4.500)

## 2023-04-09 LAB — PROTIME-INR
INR: 2.2 — ABNORMAL HIGH (ref 0.8–1.2)
Prothrombin Time: 24.5 s — ABNORMAL HIGH (ref 11.4–15.2)

## 2023-04-09 LAB — COMPREHENSIVE METABOLIC PANEL
ALT: 20 U/L (ref 0–44)
AST: 30 U/L (ref 15–41)
Albumin: 4.2 g/dL (ref 3.5–5.0)
Alkaline Phosphatase: 77 U/L (ref 38–126)
Anion gap: 18 — ABNORMAL HIGH (ref 5–15)
BUN: 36 mg/dL — ABNORMAL HIGH (ref 8–23)
CO2: 26 mmol/L (ref 22–32)
Calcium: 7.9 mg/dL — ABNORMAL LOW (ref 8.9–10.3)
Chloride: 97 mmol/L — ABNORMAL LOW (ref 98–111)
Creatinine, Ser: 6.5 mg/dL — ABNORMAL HIGH (ref 0.61–1.24)
GFR, Estimated: 9 mL/min — ABNORMAL LOW (ref >60)
Glucose, Bld: 123 mg/dL — ABNORMAL HIGH (ref 70–99)
Potassium: 4.6 mmol/L (ref 3.5–5.1)
Sodium: 141 mmol/L (ref 135–145)
Total Bilirubin: 0.5 mg/dL (ref 0.3–1.2)
Total Protein: 8.1 g/dL (ref 6.5–8.1)

## 2023-04-09 LAB — MAGNESIUM: Magnesium: 2 mg/dL (ref 1.7–2.4)

## 2023-04-09 LAB — T4, FREE: Free T4: 1.26 ng/dL — ABNORMAL HIGH (ref 0.61–1.12)

## 2023-04-09 LAB — PREALBUMIN: Prealbumin: 44 mg/dL — ABNORMAL HIGH (ref 18–38)

## 2023-04-09 LAB — LACTATE DEHYDROGENASE: LDH: 215 U/L — ABNORMAL HIGH (ref 98–192)

## 2023-04-09 NOTE — Progress Notes (Addendum)
Patient presents for 2 month f/u w/2.5 year inter[facility] today with brother Marcus Johnson). Reports no problems with VAD equipment or concerns with drive line.  Patient arrived via wheelchair today but was able to step on the scale for his weight. Pt denies falls, heart failure symptoms, or signs of bleeding. Complains of nausea and fatigue on dialysis days. States he is currently not using Zofran to help alleviate symptoms. He states that the Zofran prohibits him for vomiting and so he doesn't take it.  Patient reports he continues M/W/F dialysis. Pt confirms he takes Midodrine TID on HD days only.   Pt on Keflex 500 mg BID (dosed for HD schedule). Marcus Johnson reports they are changing dressing weekly. Dressing changed today in clinic. Currently wearing 2 anchors. Reports he wears binder at home, but does not like to wear during dialysis. See documentation below for dressing change details.     Pt continues on amiodarone. We have drawn thyroid panel today as well as CMET. Order will be sent to dialysis to obtain a lipid panel at his next lab draw there.   Vital Signs:  Doppler Pressure: 108 Automatic BP: 107/93 (100) HR: 76 NSR O2: UTO   Weight: 202.8 lbs w/ eqt Last wt: 200.4 w/ eqt   VAD Indication: Destination Therapy due to CKD Stage 3b   LVAD assessment:  HM III: Speed: 5800 rpms Flow: 5.8 Power: 4.7 w    PI: 2.7 Alarms: none Events: 10-20  Fixed speed:  5800 Low speed limit:  5500  Primary controller: back up battery due for replacement in 26 months Secondary controller: Expired. Replaced [id number].  back up battery due for replacement in 32 months  I reviewed the LVAD parameters from today and compared the results to the patient's prior recorded data. LVAD interrogation was NEGATIVE for significant power changes, NEGATIVE for clinical alarms and STABLE for PI events/speed drops. No programming changes were made and pump is functioning within specified parameters. Pt is  performing daily controller and system monitor self tests along with completing weekly and monthly maintenance for LVAD equipment.   LVAD equipment check completed and is in good working order. Back-up equipment present.    Annual Equipment Maintenance on UBC/PM was performed 10/23/22.  Exit Site Care: Dressing maintained weekly by pt's brother. Gauze VAD dressing and anchor removed and site care performed using sterile technique. Drive line exit site cleaned with Chlora prep applicators x 2, allowed to dry before gauze dressing NO SILVER STRIP applied. Exit site incorporated. The velour is fully implanted at exit site. No drainage, redness, tenderness, or foul odor noted. Small dried scab at exit site removed with cleansing. Drive line anchor re-applied x 2. Pt denies fever or chills. May continue weekly dressing using daily kits. Pt given 8 daily dressing kits and 20 anchors for at home use.  Device: Medtronic single Therapies: on 182 bpm Pacing: VVI 40 Last check:  02/28/22   BP & Labs:  Doppler 108 - Doppler is reflecting modified systolic   Hgb 11.5 - No S/S of bleeding. Specifically denies melena/BRBPR or nosebleeds.   LDH 238 - established baseline of 160 - 180. Denies tea-colored urine. No power elevations noted on interrogation.   [place] Cardiomyopathy Questionnaire     04/09/2023   11:07 AM 10/23/2022    2:32 PM 04/03/2022    3:22 PM  KCCQ-12  1 a. Ability to shower/bathe Extremely limited Extremely limited Not at all limited  1 b. Ability to walk 1  block Not at all limited Extremely limited Moderately limited  1 c. Ability to hurry/jog Extremely limited Extremely limited Not at all limited  2. Edema feet/ankles/legs Never over the past 2 weeks Never over the past 2 weeks Never over the past 2 weeks  3. Limited by fatigue At least once a day Never over the past 2 weeks Several times a day  4. Limited by dyspnea Never over the past 2 weeks Never over the past 2 weeks Never  over the past 2 weeks  5. Sitting up / on 3+ pillows Never over the past 2 weeks Never over the past 2 weeks 1-2 times a week  6. Limited enjoyment of life Not limited at all Not limited at all Not limited at all  7. Rest of life w/ symptoms Somewhat satisfied Completely satisfied Somewhat satisfied  8 a. Participation in hobbies Severely limited N/A, did not do for other reasons N/A, did not do for other reasons  8 b. Participation in chores Slightly limited N/A, did not do for other reasons N/A, did not do for other reasons  8 c. Visiting family/friends Slightly limited Limited quite a bit Moderately limited     Plan: No change in medications We will add a lipid panel to your labs at dialysis Return to clinic in 2 months  Marcus Adam RN, BSN VAD Coordinator 24/7 Pager [phone]  Follow up for Heart Failure/LVAD:  Marcus Johnson. is a 62 y.o. male who has a h/o DM2, CAD s/p CABG 2002, systolic HF due to ischemic cardiomyopathy with EF 20-25% (echo 12/15), DM2 and CKD. He is s/p Medtronic single chamber ICD.   Admitted in 12/15 due to ADHF. Required short course milrinone for diuresis. Diuresed 30 pounds.    CPX 2/16 showed severely reduced functional capacity.  There was a significant disconnect between his symptoms and the CPX.  RHC in 6/16 showed fairly normal filling pressures and low but not markedly low cardiac output.    CPX (4/19) was submaximal but suggested severe limitation from HF.    He was admitted in 6/19 with marked volume overload and dyspnea.  Echo in 6/19 showed EF 20% with severe global hypokinesis.  LHC/RHC showed severe native vessel CAD with patent LIMA-D, SVG-OM, and SVG-PLV; cardiac index low.  He was started on milrinone and diuresed.  We discussed LVAD extensively in light of low output, creatinine that is trending up and concern for decompensation of RV over time. He was adamant that he did not want to pursue LVAD workup yet and did not want a  referral for transplant evaluation. Milrinone was weaned off and he was discharged home.    Admitted 08/01/18-08/07/18 with volume overload and left foot wound. Diuresed 38 lbs with IV lasix and then transitioned back to torsemide 100 mg BID. ID was consulted with concerns for left foot osteomyelitis noted on CT scan. He got IV ancef q8 hours x 42 days. [facility] provided teaching and supplies for home antibiotics. Course complicated by AKI. DC weight 200 lbs.    He went into atrial flutter and had DCCV to NSR in 1/20.    Echo in 4/21 showed EF 25% with apical akinesis and diffuse hypokinesis relatively preserved in lateral wall, mildly decreased RV systolic function, PASP 41 mmHg. CPX in 5/21 showed severe functional limitation suggestive of advanced HF. 5/21 Zio patch showed short atrial flutter runs, 3.8% PVCs.    He developed COVID-19 infection in 1/22.  Symptoms were predominantly GI.  He developed progressive worsening of CHF in early 2022 and was admitted in 3/22 with low output HF.  Impella 5.5 was placed as bridge to Heartmate III LVAD placed in 10/13/20.  He developed post-op renal failure and ended up on dialysis.  He was markedly deconditioned post-op and went to CIR.    Still has pain and minimal function in his right hand.  Nerve conduction study showed a chronic sensorimotor neuropathy in the right arm.    Ramp echo in 3/23 showed severe LV dysfunction with mild RV enlargement and mild dysfunction, mild-moderate AI.  Speed was increased to 5800 rpm.  The aortic valve still opened with every beat but only slightly. Flow increased from 4.1 to 5.3 L/min and PI remained stable.   Patient was admitted in 4/23 with falls and lightheadedness. He fractured his left foot when falling and initially was unable to walk.  He was thought to be hypotensive due to over-diuresis and dry weight was increased.  Midodrine on HD days was also increased.  He was treated for a driveline infection with vancomycin  then doxycycline, now off.   ICD shock for VT in 7/23, not started on amiodarone given only 1 episode.   Echo in 3/24 showed EF 20-25% with midline IV septum, mild RV dysfunction with severe RVE, moderate AI.   In 5/24, patient had VT treated with ATP.  He was started on amiodarone.   Patient returns today for followup of CHF/LVAD.  Weight up 2 lbs.  No BRBPR/melena.  Driveline without drainage or pain at site.  He remains on chronic cephalexin.  Still needs midodrine on HD days, still gets very tired/weak after HD.  Generally, no dyspnea walking on flat ground.  Able to do his ADLs without trouble.  No lightheadedness.  Trying to stay active.  No VT.   I reviewed the LVAD parameters from today, and compared the results to the patient's prior recorded data.  No programming changes were made.  The LVAD is functioning within specified parameters.  The patient performs LVAD self-test daily.  LVAD interrogation was negative for any significant power changes, alarms or PI events/speed drops.  LVAD equipment check completed and is in good working order.  Back-up equipment present.   LVAD education done on emergency procedures and precautions and reviewed exit site care.  Labs (5/22): LDH 174, hgb 10.6 Labs (7/22): LDH 164 Labs (8/22): hgb 11.4 Labs (10/22): hgb 12 Labs (11/22): hgb 12.2 Labs (4/23): LDL 163, hgb 10.4 Labs (6/23): hgb 12.2, LDH 284 Labs (7/23): LFTs normal, hgb 11.3, LDH 235 Labs (9/23): hgb 10.2 Labs (1/24): hgb 10.6, LDH 279 Labs (3/24): LDH 222, hgb 11.2 Labs (6/24): LDH 219, hgb 11.3, TSH normal  Past Medical History:  Diagnosis Date   AICD (automatic cardioverter/defibrillator) present    Single-chamber  implantable cardiac defibrillator - Medtronic   Atrial fibrillation (HCC)    Cataract    Mixed form OU   CHF (congestive heart failure) (HCC)    Chronic kidney disease    Chronic kidney disease (CKD), stage III (moderate) (HCC)    Chronic systolic heart failure (HCC)     a. Echo 5/13: Mild LVE, mild LVH, EF 10%, anteroseptal, lateral, apical AK, mild MR, mild LAE, moderately reduced RVSF, mild RAE, PASP 60;  b. 07/2014 Echo: EF 20-2%, diff HK, AKI of antsep/apical/mid-apicalinferior, mod reduced RV.   Coronary artery disease    a. s/p CABG 2002 b. LHC 5/13:  dLM 80%, LAD subtotally occluded, pCFX  occluded, pRCA 50%, mid? Occlusion with high take off of the PDA with 70% multiple lesions-not bypassed and supplies collaterals to LAD, LIMA-IM/ramus ok, S-OM ok, S-PLA branches ok. Medical therapy was recommended   COVID    Diabetic retinopathy (HCC)    NPDR OD, PDR OS   Dyspnea    Gout    "on daily RX" (01/08/2018)   Hypertension    Hypertensive retinopathy    OU   Ischemic cardiomyopathy    a. Echo 5/13: Mild LVE, mild LVH, EF 10%, anteroseptal, lateral, apical AK, mild MR, mild LAE, moderately reduced RVSF, mild RAE, PASP 60;  b. 01/2012 s/p MDT [id number] Protecta XT VR AICD;  c. 07/2014 Echo: EF 20-2%, diff HK, AKI of antsep/apical/mid-apicalinferior, mod reduced RV.   MRSA (methicillin resistant Staphylococcus aureus)    Status post right foot plantar deep infection with MRSA status post  I&D 10/2008   Myocardial infarction [facility])    "was told I'd had several before heart OR in 2002" (01/08/2018)   Noncompliance    Peripheral neuropathy    Retinopathy, diabetic, background (HCC)    Syncope    Type II diabetes mellitus (HCC)    requiring insulin    Vitreous hemorrhage, left (HCC)    and proliferative diabetic retinopathy    Current Outpatient Medications  Medication Sig Dispense Refill   acetaminophen (TYLENOL) 325 MG tablet Take by mouth.     amiodarone (PACERONE) 200 MG tablet Take 1 tablet (200 mg total) by mouth daily. Take 1 tablet (200 mg) by mouth twice daily for 2 weeks, then decrease to 1 tablet (200 mg) daily 90 tablet 3   atorvastatin (LIPITOR) 40 MG tablet Take 1 tablet (40 mg total) by mouth daily.     BD PEN NEEDLE NANO 2ND GEN 32G X 4 MM MISC  USE TO INJECT INSULIN DAILY 100 each 0   Blood Glucose Monitoring Suppl (ONE TOUCH ULTRA 2) w/Device KIT 1 Device by Does not apply route 3 (three) times daily. 1 kit 0   cephALEXin (KEFLEX) 500 MG capsule Take 1 capsule (500 mg total) by mouth 2 (two) times daily. Take evening dose after dialysis on HD days. 120 capsule 1   gabapentin (NEURONTIN) 100 MG capsule TAKE 1 CAPSULE ( 100 MG) IN THE MORNING AND 2 CAPSULES ( 200 MG) AT BEDTIME 90 capsule 3   glucose blood (ACCU-CHEK GUIDE) test strip USE AS INSTRUCTED 3 TIMES DAILY 100 strip 1   insulin glargine (LANTUS SOLOSTAR) 100 UNIT/ML Solostar Pen Inject 60 Units into the skin at bedtime. (Patient taking differently: Inject 60 Units into the skin daily as needed.) 30 mL 11   midodrine (PROAMATINE) 10 MG tablet Take 2 tablets (20 mg total) by mouth 3 (three) times daily. Three times a day on dialysis days; NO midodrine on NON dialysis days 160 tablet 11   multivitamin (RENA-VIT) TABS tablet TAKE 1 TABLET BY MOUTH EVERYDAY AT BEDTIME 90 tablet 3   naphazoline-glycerin (CLEAR EYES REDNESS) 0.012-0.25 % SOLN Place 1-2 drops into the right eye 4 (four) times daily as needed for eye irritation.     OneTouch UltraSoft 2 Lancets MISC 1 each by Does not apply route 3 (three) times daily before meals. 100 each 1   pantoprazole (PROTONIX) 40 MG tablet TAKE 1 TABLET BY MOUTH EVERY DAY 90 tablet 3   polyethylene glycol powder (GLYCOLAX/MIRALAX) 17 GM/SCOOP powder      rosuvastatin (CRESTOR) 10 MG tablet TAKE 1 TABLET BY MOUTH EVERY DAY 90  tablet 1   sevelamer (RENAGEL) 800 MG tablet Take 800 mg by mouth 3 (three) times daily.     warfarin (COUMADIN) 3 MG tablet TAKE 4.5 MG (1.5 TABS) DAILY OR AS DIRECTED BY HF CLINIC 150 tablet 3   allopurinol (ZYLOPRIM) 300 MG tablet Take by mouth. (Patient not taking: Reported on 04/09/2023)     ondansetron (ZOFRAN) 4 MG tablet Take 1 tablet (4 mg total) by mouth every 8 (eight) hours as needed for nausea or vomiting. (Patient  not taking: Reported on 04/09/2023) 30 tablet 3   traMADol (ULTRAM) 50 MG tablet Take 1 tablet (50 mg total) by mouth every 12 (twelve) hours as needed. (Patient not taking: Reported on 04/09/2023) 30 tablet 0   No current facility-administered medications for this encounter.    Meclizine hcl and Ivabradine  REVIEW OF SYSTEMS: All systems negative except as listed in HPI, PMH and Problem list.   LVAD INTERROGATION:  Please see LVAD nurse's note above.   MAP 100  Physical Exam: General: Well appearing this am. NAD.  HEENT: Normal. Neck: Supple, JVP 7-8 cm. Carotids OK.  Cardiac:  Mechanical heart sounds with LVAD hum present.  Lungs:  CTAB, normal effort.  Abdomen:  NT, ND, no HSM. No bruits or masses. +BS. Small ventral hernia.  LVAD exit site: Well-healed and incorporated. Dressing dry and intact. No erythema or drainage. Stabilization device present and accurately applied. Driveline dressing changed daily per sterile technique. Extremities:  Warm and dry. No cyanosis, clubbing, rash, or edema.  Neuro:  Alert & oriented x 3. Cranial nerves grossly intact. Moves all 4 extremities w/o difficulty. Affect pleasant     ASSESSMENT AND PLAN:  1. Chronic systolic CHF:  Ischemic cardiomyopathy. Has Medtronic ICD. Low output HF, admitted and Impella 5.5 placed 10/04/20.  HM3 LVAD was placed on 10/13/20. Developed post-op renal failure requiring eventual hemodialysis.  Ramp echo in 3/23, speed increased to 5800 rpm under echo guidance with increase in flow and stable PI.  Echo in 3/24 showed EF 20-25% with midline IV septum, mild RV dysfunction with severe RVE, moderate AI. MAP mildly elevated today (100) but drops on HD days without midodrine.  He is not volume overloaded.  - Continue midodrine on HD days and reminded him not to take midodrine on the non-HD days.    2. LVAD:  VAD interrogated personally.  Parameters stable with PI events on HD days. No low flow events.  - He is now off ASA.  -  Continue warfarin with INR goal 2-2.5.   3. CAD s/p CABG 2002:  Last cath in 6/19 with patent grafts. No chest pain.  - Continue Crestor.  4. ESRD: Currently dialyzing via tunneled catheter. No low flow events. He has had some nausea after dialysis.  - Continue HD M-W-F  - With continuous flow LVAD, think AV fistula would be unlikely to mature.   5. Atrial flutter/fibrillation: s/p DC-CV 08/21/18.  Has occasional episodes of AF but not prolonged.  - Continue amiodarone, check LFTs and TSH today.  He will need regular eye exam.  6. 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 This has been improving though not completely normal.   7. DM: Per primary care.  8. Anemia: CBC today, no evidence for overt bleeding.  9. Suspected Gastroparesis: He is no longer taking Reglan.  10. VT: Episode in 7/23 with successful ICD discharge.  No definite trigger, he did not actually feel the VT.  VT again in 5/24 treated with ATP, amiodarone started.  No further VT.  - Continue amiodarone.  Check LFTs and TSH, will need regular eye exam.  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 Driveline site looks good today.   Followup in 2 months.   Marca Ancona 04/09/2023

## 2023-04-09 NOTE — Patient Instructions (Signed)
No change in medications We will add a lipid panel to your labs at dialysis Return to clinic in 2 months

## 2023-04-09 NOTE — Progress Notes (Signed)
LVAD INR 

## 2023-04-10 DIAGNOSIS — N186 End stage renal disease: Secondary | ICD-10-CM | POA: Diagnosis not present

## 2023-04-10 DIAGNOSIS — Z992 Dependence on renal dialysis: Secondary | ICD-10-CM | POA: Diagnosis not present

## 2023-04-10 DIAGNOSIS — N2581 Secondary hyperparathyroidism of renal origin: Secondary | ICD-10-CM | POA: Diagnosis not present

## 2023-04-10 LAB — CUP PACEART REMOTE DEVICE CHECK
Battery Remaining Longevity: 119 mo
Battery Voltage: 3.01 V
Brady Statistic RV Percent Paced: 0.01 %
Date Time Interrogation Session: 20240903103530
HighPow Impedance: 36 Ohm
HighPow Impedance: 44 Ohm
Implantable Lead Connection Status: 753985
Implantable Lead Implant Date: 20130603
Implantable Lead Location: 753860
Implantable Lead Model: 6947
Implantable Pulse Generator Implant Date: 20211129
Lead Channel Impedance Value: 399 Ohm
Lead Channel Impedance Value: 646 Ohm
Lead Channel Pacing Threshold Amplitude: 0.75 V
Lead Channel Pacing Threshold Pulse Width: 0.4 ms
Lead Channel Sensing Intrinsic Amplitude: 5.25 mV
Lead Channel Sensing Intrinsic Amplitude: 5.25 mV
Lead Channel Setting Pacing Amplitude: 2 V
Lead Channel Setting Pacing Pulse Width: 0.4 ms
Lead Channel Setting Sensing Sensitivity: 0.3 mV
Zone Setting Status: 755011
Zone Setting Status: 755011

## 2023-04-10 LAB — T3, FREE: T3, Free: 1.8 pg/mL — ABNORMAL LOW (ref 2.0–4.4)

## 2023-04-11 ENCOUNTER — Ambulatory Visit (INDEPENDENT_AMBULATORY_CARE_PROVIDER_SITE_OTHER): Payer: Medicare HMO | Admitting: Podiatry

## 2023-04-11 ENCOUNTER — Encounter: Payer: Self-pay | Admitting: Podiatry

## 2023-04-11 DIAGNOSIS — M79675 Pain in left toe(s): Secondary | ICD-10-CM | POA: Diagnosis not present

## 2023-04-11 DIAGNOSIS — E1122 Type 2 diabetes mellitus with diabetic chronic kidney disease: Secondary | ICD-10-CM

## 2023-04-11 DIAGNOSIS — N186 End stage renal disease: Secondary | ICD-10-CM

## 2023-04-11 DIAGNOSIS — M79674 Pain in right toe(s): Secondary | ICD-10-CM

## 2023-04-11 DIAGNOSIS — Z794 Long term (current) use of insulin: Secondary | ICD-10-CM

## 2023-04-11 DIAGNOSIS — Z992 Dependence on renal dialysis: Secondary | ICD-10-CM

## 2023-04-11 DIAGNOSIS — B351 Tinea unguium: Secondary | ICD-10-CM

## 2023-04-11 NOTE — Progress Notes (Signed)
This patient presents  to my office for at risk foot care.  This patient requires this care by a professional since this patient will be at risk due to having  CKD, coagulation defect and DM.  This patient is unable to cut nails himself since the patient cannot reach his nails.These nails are painful walking and wearing shoes. Painful callus right  foot.Marland Kitchen  He presents to the office with his brother.  This patient presents for at risk foot care today.  General Appearance  Alert, conversant and in no acute stress.  Vascular  Dorsalis pedis and posterior tibial  pulses are absent  bilaterally.  Capillary return is within normal limits  bilaterally. Temperature is within normal limits  bilaterally.  Neurologic  Senn-Weinstein monofilament wire test absent  bilaterally. Muscle power within normal limits bilaterally.  Nails Thick disfigured discolored nails with subungual debris  from hallux to fifth toes bilaterally. No evidence of bacterial infection or drainage bilaterally.  Orthopedic  No limitations of motion  feet .  No crepitus or effusions noted.  No bony pathology or digital deformities noted.  No  STJ ROM left foot.  Skin  normotropic skin with no porokeratosis noted bilaterally.  No signs of infections or ulcers noted.   Callus lateral aspect fifth metabase left foot asymptomatic. Callus right heel.  Onychomycosis  Pain in right toes  Pain in left toes  Consent was obtained for treatment procedures.   Mechanical debridement of nails 1-5  bilaterally performed with a nail nipper.  Filed with dremel without incident.   Debride porokeratosis left foot with dremel tool. .   Return office visit     3 months                 Told patient to return for periodic foot care and evaluation due to potential at risk complications. Patient requests diabetic shoes.  Told him to make an appointment with pedorthist.   Helane Gunther DPM

## 2023-04-12 DIAGNOSIS — N186 End stage renal disease: Secondary | ICD-10-CM | POA: Diagnosis not present

## 2023-04-12 DIAGNOSIS — Z992 Dependence on renal dialysis: Secondary | ICD-10-CM | POA: Diagnosis not present

## 2023-04-12 DIAGNOSIS — N2581 Secondary hyperparathyroidism of renal origin: Secondary | ICD-10-CM | POA: Diagnosis not present

## 2023-04-15 DIAGNOSIS — Z992 Dependence on renal dialysis: Secondary | ICD-10-CM | POA: Diagnosis not present

## 2023-04-15 DIAGNOSIS — N2581 Secondary hyperparathyroidism of renal origin: Secondary | ICD-10-CM | POA: Diagnosis not present

## 2023-04-15 DIAGNOSIS — N186 End stage renal disease: Secondary | ICD-10-CM | POA: Diagnosis not present

## 2023-04-16 ENCOUNTER — Ambulatory Visit (HOSPITAL_COMMUNITY): Payer: Self-pay | Admitting: Student-PharmD

## 2023-04-16 LAB — POCT INR: INR: 2.1 (ref 2.0–3.0)

## 2023-04-17 DIAGNOSIS — N2581 Secondary hyperparathyroidism of renal origin: Secondary | ICD-10-CM | POA: Diagnosis not present

## 2023-04-17 DIAGNOSIS — N186 End stage renal disease: Secondary | ICD-10-CM | POA: Diagnosis not present

## 2023-04-17 DIAGNOSIS — Z992 Dependence on renal dialysis: Secondary | ICD-10-CM | POA: Diagnosis not present

## 2023-04-19 DIAGNOSIS — N2581 Secondary hyperparathyroidism of renal origin: Secondary | ICD-10-CM | POA: Diagnosis not present

## 2023-04-19 DIAGNOSIS — Z992 Dependence on renal dialysis: Secondary | ICD-10-CM | POA: Diagnosis not present

## 2023-04-19 DIAGNOSIS — N186 End stage renal disease: Secondary | ICD-10-CM | POA: Diagnosis not present

## 2023-04-21 ENCOUNTER — Telehealth (HOSPITAL_COMMUNITY): Payer: Self-pay | Admitting: Unknown Physician Specialty

## 2023-04-21 NOTE — Telephone Encounter (Addendum)
Received page from pt this morning at 0745. Pt states that his ICD fired at 0730 this morning while he sitting on the side of the bed. Pt tells me that his ICD also fired yesterday morning while he was sleeping. Pt denies any VAD alarms and tells me that he feels normal. Denies any SOB, weakness or dizziness. Pt states that he had dialysis on Friday and did not have any issues during his treatment or alarms from his VAD. D/w Dr Shirlee Latch, pt was instructed to take an extra 400 mg of Amiodarone. We have made him appt to be seen in clinic tomorrow at 1p following his dialysis treatment. I have ask pt to send a remote transmission from his medtronic device today so we can view it tomorrow when he is in clinic. Pt was instructed to page if his ICD fires again. We may need to send him to the ED if the problem persists before we can see him in clinic tomorrow. Pt verbalized understanding understanding of all instructions.   Carlton Adam RN, BSN VAD Coordinator 24/7 Pager [phone]

## 2023-04-22 ENCOUNTER — Other Ambulatory Visit (HOSPITAL_COMMUNITY): Payer: Self-pay | Admitting: *Deleted

## 2023-04-22 ENCOUNTER — Encounter (HOSPITAL_COMMUNITY): Payer: Self-pay | Admitting: Cardiology

## 2023-04-22 ENCOUNTER — Telehealth: Payer: Self-pay | Admitting: Internal Medicine

## 2023-04-22 ENCOUNTER — Ambulatory Visit (HOSPITAL_COMMUNITY): Payer: Self-pay | Admitting: Pharmacist

## 2023-04-22 ENCOUNTER — Ambulatory Visit (HOSPITAL_COMMUNITY)
Admission: RE | Admit: 2023-04-22 | Discharge: 2023-04-22 | Disposition: A | Discharge status: Home / Self Care | Payer: Medicare HMO | Source: Ambulatory Visit | Attending: Cardiology | Admitting: Cardiology

## 2023-04-22 DIAGNOSIS — E1136 Type 2 diabetes mellitus with diabetic cataract: Secondary | ICD-10-CM | POA: Diagnosis not present

## 2023-04-22 DIAGNOSIS — I5022 Chronic systolic (congestive) heart failure: Secondary | ICD-10-CM | POA: Diagnosis not present

## 2023-04-22 DIAGNOSIS — Z95811 Presence of heart assist device: Secondary | ICD-10-CM | POA: Diagnosis not present

## 2023-04-22 DIAGNOSIS — I4892 Unspecified atrial flutter: Secondary | ICD-10-CM | POA: Insufficient documentation

## 2023-04-22 DIAGNOSIS — I132 Hypertensive heart and chronic kidney disease with heart failure and with stage 5 chronic kidney disease, or end stage renal disease: Secondary | ICD-10-CM | POA: Diagnosis not present

## 2023-04-22 DIAGNOSIS — I472 Ventricular tachycardia, unspecified: Secondary | ICD-10-CM | POA: Diagnosis not present

## 2023-04-22 DIAGNOSIS — Z79899 Other long term (current) drug therapy: Secondary | ICD-10-CM | POA: Diagnosis not present

## 2023-04-22 DIAGNOSIS — M79641 Pain in right hand: Secondary | ICD-10-CM | POA: Insufficient documentation

## 2023-04-22 DIAGNOSIS — N186 End stage renal disease: Secondary | ICD-10-CM | POA: Diagnosis not present

## 2023-04-22 DIAGNOSIS — B9561 Methicillin susceptible Staphylococcus aureus infection as the cause of diseases classified elsewhere: Secondary | ICD-10-CM | POA: Insufficient documentation

## 2023-04-22 DIAGNOSIS — N2581 Secondary hyperparathyroidism of renal origin: Secondary | ICD-10-CM | POA: Diagnosis not present

## 2023-04-22 DIAGNOSIS — D649 Anemia, unspecified: Secondary | ICD-10-CM | POA: Diagnosis not present

## 2023-04-22 DIAGNOSIS — I4891 Unspecified atrial fibrillation: Secondary | ICD-10-CM | POA: Diagnosis not present

## 2023-04-22 DIAGNOSIS — I2510 Atherosclerotic heart disease of native coronary artery without angina pectoris: Secondary | ICD-10-CM | POA: Insufficient documentation

## 2023-04-22 DIAGNOSIS — I255 Ischemic cardiomyopathy: Secondary | ICD-10-CM | POA: Diagnosis not present

## 2023-04-22 DIAGNOSIS — Z7901 Long term (current) use of anticoagulants: Secondary | ICD-10-CM | POA: Insufficient documentation

## 2023-04-22 DIAGNOSIS — E1122 Type 2 diabetes mellitus with diabetic chronic kidney disease: Secondary | ICD-10-CM | POA: Insufficient documentation

## 2023-04-22 DIAGNOSIS — Z951 Presence of aortocoronary bypass graft: Secondary | ICD-10-CM | POA: Insufficient documentation

## 2023-04-22 DIAGNOSIS — I5042 Chronic combined systolic (congestive) and diastolic (congestive) heart failure: Secondary | ICD-10-CM

## 2023-04-22 DIAGNOSIS — Z992 Dependence on renal dialysis: Secondary | ICD-10-CM | POA: Diagnosis not present

## 2023-04-22 LAB — CBC
HCT: 36.8 % — ABNORMAL LOW (ref 39.0–52.0)
Hemoglobin: 11.4 g/dL — ABNORMAL LOW (ref 13.0–17.0)
MCH: 29.6 pg (ref 26.0–34.0)
MCHC: 31 g/dL (ref 30.0–36.0)
MCV: 95.6 fL (ref 80.0–100.0)
Platelets: 184 10*3/uL (ref 150–400)
RBC: 3.85 MIL/uL — ABNORMAL LOW (ref 4.22–5.81)
RDW: 17.8 % — ABNORMAL HIGH (ref 11.5–15.5)
WBC: 7.7 10*3/uL (ref 4.0–10.5)
nRBC: 0 % (ref 0.0–0.2)

## 2023-04-22 LAB — BASIC METABOLIC PANEL
Anion gap: 13 (ref 5–15)
BUN: 20 mg/dL (ref 8–23)
CO2: 30 mmol/L (ref 22–32)
Calcium: 8 mg/dL — ABNORMAL LOW (ref 8.9–10.3)
Chloride: 95 mmol/L — ABNORMAL LOW (ref 98–111)
Creatinine, Ser: 4.23 mg/dL — ABNORMAL HIGH (ref 0.61–1.24)
GFR, Estimated: 15 mL/min — ABNORMAL LOW (ref >60)
Glucose, Bld: 134 mg/dL — ABNORMAL HIGH (ref 70–99)
Potassium: 3.4 mmol/L — ABNORMAL LOW (ref 3.5–5.1)
Sodium: 138 mmol/L (ref 135–145)

## 2023-04-22 LAB — MAGNESIUM: Magnesium: 1.9 mg/dL (ref 1.7–2.4)

## 2023-04-22 LAB — PROTIME-INR
INR: 2 — ABNORMAL HIGH (ref 0.8–1.2)
Prothrombin Time: 22.7 s — ABNORMAL HIGH (ref 11.4–15.2)

## 2023-04-22 LAB — LACTATE DEHYDROGENASE: LDH: 218 U/L — ABNORMAL HIGH (ref 98–192)

## 2023-04-22 MED ORDER — AMIODARONE HCL 200 MG PO TABS: 200.0000 mg | ORAL_TABLET | Freq: Two times a day (BID) | ORAL | 3 refills | Status: AC

## 2023-04-22 NOTE — Progress Notes (Signed)
Remote ICD transmission.   

## 2023-04-22 NOTE — Telephone Encounter (Signed)
Marcus Johnson states she needs a copy of the remote report to see why he was shocked. Please advise

## 2023-04-22 NOTE — Patient Instructions (Addendum)
Take Amiodarone 200 mg twice daily for 2 weeks starting 04/23/23. Then decrease to 200 mg daily.  Coumadin dosing per Posey Boyer D Return to clinic next week for follow up with RAMP echo. We will call you with appointment information.

## 2023-04-22 NOTE — Progress Notes (Addendum)
Patient presents for sick visit following ICD shock x 2 VAD Clinic today with brother [name]). Reports no problems with VAD equipment or concerns with drive line.  Pt paged VAD coordinator on Sunday morning reporting he was shocked x 2- 1 Saturday night and 1 Sunday morning. Asymptomatic at time of shocks. Denies VAD alarms. VAD coordinator discussed with Dr Shirlee Latch. Pt instructed to take Amiodarone 400 mg in AM and 200 mg in PM starting 9/15. Advised to come to clinic for evaluation today.   Patient arrived via wheelchair today but was able to step on the scale for his weight. Pt denies falls, heart failure symptoms, or signs of bleeding. Complains of fatigue on dialysis days. States he was in his usual state of health over the weekend when he was shocked x 2. Pt was shocked- 9/11, 9/14, & 9/15 for sustained VT events rates as high at 330. Received several ATP attempts prior to shocks. Shocked appropriately with return to SR. Remote transmission report reviewed with Dr Shirlee Latch. Will continue Amiodarone 200 mg BID x 2 weeks, then 200 mg daily after that. Updated prescription sent to pt's pharmacy. Will obtain RAMP echo with visit next week.   Patient reports he continues M/W/F dialysis. Pt confirms he takes Midodrine TID on HD days only.   Pt on Keflex 500 mg BID (dosed for HD schedule). [name] reports they are changing dressing weekly. Currently wearing 2 anchors. Reports he wears binder at home, but does not like to wear during dialysis.   Dry weight at HD: 87.5 kg. Pt states they typically pull 4 L at each treatment.   [employer] medical release form filled out and faxed in today.    Vital Signs:  Doppler Pressure: 98 Automatic BP: 102/85 (91) HR: 79 NSR O2: UTO   Weight: 207.5 lbs w/ eqt Last wt: 2082.8 w/ eqt   VAD Indication: Destination Therapy due to CKD Stage 3b   LVAD assessment:  HM III: Speed: 5800 rpms Flow: 5.7 Power: 4.8 w    PI: 2.6 Alarms: none Events: 10-20  Fixed  speed:  5800 Low speed limit:  5500  Primary controller: Back up battery due for replacement in 26 months Secondary controller: Back up battery due for replacement in 32 months  I reviewed the LVAD parameters from today and compared the results to the patient's prior recorded data. LVAD interrogation was NEGATIVE for significant power changes, NEGATIVE for clinical alarms and STABLE for PI events/speed drops. No programming changes were made and pump is functioning within specified parameters. Pt is performing daily controller and system monitor self tests along with completing weekly and monthly maintenance for LVAD equipment.   LVAD equipment check completed and is in good working order. Back-up equipment present.    Annual Equipment Maintenance on UBC/PM was performed 10/23/22.  Exit Site Care: Dressing maintained weekly by pt's brother. Dressing is clean, dry, and intact. Anchors correctly applied. Pt denies fever or chills. May continue weekly dressing using daily kits. Pt has adequate supplies for home use.   Device: Medtronic single Therapies: VF: 240 VT: 200 Slow VT: 143 Pacing: VVI 40 Last check: 04/09/23   BP & Labs:  Doppler 98 - Doppler is reflecting MAP  Hgb pending - No S/S of bleeding. Specifically denies melena/BRBPR or nosebleeds.   LDH 218- established baseline of 160 - 180. Denies tea-colored urine. No power elevations noted on interrogation.    Plan: Take Amiodarone 200 mg twice daily for 2 weeks starting 04/23/23. Then  decrease to 200 mg daily on 05/07/23. Coumadin dosing per Posey Boyer D Return to clinic next week for follow up with RAMP echo. We will call you with appointment information.   Alyce Pagan RN VAD Coordinator  Office: [phone]  24/7 Pager: [phone]   Follow up for Heart Failure/LVAD:  Hameed Aber. is a 62 y.o. male who has a h/o DM2, CAD s/p CABG 2002, systolic HF due to ischemic cardiomyopathy with EF 20-25% (echo  12/15), DM2 and CKD. He is s/p Medtronic single chamber ICD.   Admitted in 12/15 due to ADHF. Required short course milrinone for diuresis. Diuresed 30 pounds.    CPX 2/16 showed severely reduced functional capacity.  There was a significant disconnect between his symptoms and the CPX.  RHC in 6/16 showed fairly normal filling pressures and low but not markedly low cardiac output.    CPX (4/19) was submaximal but suggested severe limitation from HF.    He was admitted in 6/19 with marked volume overload and dyspnea.  Echo in 6/19 showed EF 20% with severe global hypokinesis.  LHC/RHC showed severe native vessel CAD with patent LIMA-D, SVG-OM, and SVG-PLV; cardiac index low.  He was started on milrinone and diuresed.  We discussed LVAD extensively in light of low output, creatinine that is trending up and concern for decompensation of RV over time. He was adamant that he did not want to pursue LVAD workup yet and did not want a referral for transplant evaluation. Milrinone was weaned off and he was discharged home.    Admitted 08/01/18-08/07/18 with volume overload and left foot wound. Diuresed 38 lbs with IV lasix and then transitioned back to torsemide 100 mg BID. ID was consulted with concerns for left foot osteomyelitis noted on CT scan. He got IV ancef q8 hours x 42 days. [facility] provided teaching and supplies for home antibiotics. Course complicated by AKI. DC weight 200 lbs.    He went into atrial flutter and had DCCV to NSR in 1/20.    Echo in 4/21 showed EF 25% with apical akinesis and diffuse hypokinesis relatively preserved in lateral wall, mildly decreased RV systolic function, PASP 41 mmHg. CPX in 5/21 showed severe functional limitation suggestive of advanced HF. 5/21 Zio patch showed short atrial flutter runs, 3.8% PVCs.    He developed COVID-19 infection in 1/22.  Symptoms were predominantly GI.    He developed progressive worsening of CHF in early 2022 and was admitted in 3/22 with low  output HF.  Impella 5.5 was placed as bridge to Heartmate III LVAD placed in 10/13/20.  He developed post-op renal failure and ended up on dialysis.  He was markedly deconditioned post-op and went to CIR.    Still has pain and minimal function in his right hand.  Nerve conduction study showed a chronic sensorimotor neuropathy in the right arm.    Ramp echo in 3/23 showed severe LV dysfunction with mild RV enlargement and mild dysfunction, mild-moderate AI.  Speed was increased to 5800 rpm.  The aortic valve still opened with every beat but only slightly. Flow increased from 4.1 to 5.3 L/min and PI remained stable.   Patient was admitted in 4/23 with falls and lightheadedness. He fractured his left foot when falling and initially was unable to walk.  He was thought to be hypotensive due to over-diuresis and dry weight was increased.  Midodrine on HD days was also increased.  He was treated for a driveline infection with vancomycin then doxycycline,  now off.   ICD shock for VT in 7/23, not started on amiodarone given only 1 episode.   Echo in 3/24 showed EF 20-25% with midline IV septum, mild RV dysfunction with severe RVE, moderate AI.   In 5/24, patient had VT treated with ATP.  He was started on amiodarone.   Patient was shocked by his ICD for VT on 9/11, 9/14, and 9/15.  I had him increase his amiodarone to 200 mg bid.   Patient comes for work-in visit today because of VT.  He has not had VT since yesterday morning.  He did not feel the VT (no lightheadedness/syncope), just felt the shocks.  No recent changes.  He has been getting HD as per usual, still feels weak after HD (had today), but this is not a change for him.  MAP has been stable, takes midodrine before HD.  Weight has been stable.  No dyspnea walking on flat ground.  Mild dyspnea with heavy exertion.   Labs (5/22): LDH 174, hgb 10.6 Labs (7/22): LDH 164 Labs (8/22): hgb 11.4 Labs (10/22): hgb 12 Labs (11/22): hgb 12.2 Labs (4/23):  LDL 163, hgb 10.4 Labs (6/23): hgb 12.2, LDH 284 Labs (7/23): LFTs normal, hgb 11.3, LDH 235 Labs (9/23): hgb 10.2 Labs (1/24): hgb 10.6, LDH 279 Labs (3/24): LDH 222, hgb 11.2 Labs (6/24): LDH 219, hgb 11.3, TSH normal Labs (9/24): TSH normal, hgb 11.5, LFTs normal, LDH 215  Past Medical History:  Diagnosis Date   AICD (automatic cardioverter/defibrillator) present    Single-chamber  implantable cardiac defibrillator - Medtronic   Atrial fibrillation (HCC)    Cataract    Mixed form OU   CHF (congestive heart failure) (HCC)    Chronic kidney disease    Chronic kidney disease (CKD), stage III (moderate) (HCC)    Chronic systolic heart failure (HCC)    a. Echo 5/13: Mild LVE, mild LVH, EF 10%, anteroseptal, lateral, apical AK, mild MR, mild LAE, moderately reduced RVSF, mild RAE, PASP 60;  b. 07/2014 Echo: EF 20-2%, diff HK, AKI of antsep/apical/mid-apicalinferior, mod reduced RV.   Coronary artery disease    a. s/p CABG 2002 b. LHC 5/13:  dLM 80%, LAD subtotally occluded, pCFX occluded, pRCA 50%, mid? Occlusion with high take off of the PDA with 70% multiple lesions-not bypassed and supplies collaterals to LAD, LIMA-IM/ramus ok, S-OM ok, S-PLA branches ok. Medical therapy was recommended   COVID    Diabetic retinopathy (HCC)    NPDR OD, PDR OS   Dyspnea    Gout    "on daily RX" (01/08/2018)   Hypertension    Hypertensive retinopathy    OU   Ischemic cardiomyopathy    a. Echo 5/13: Mild LVE, mild LVH, EF 10%, anteroseptal, lateral, apical AK, mild MR, mild LAE, moderately reduced RVSF, mild RAE, PASP 60;  b. 01/2012 s/p MDT [id number] Protecta XT VR AICD;  c. 07/2014 Echo: EF 20-2%, diff HK, AKI of antsep/apical/mid-apicalinferior, mod reduced RV.   MRSA (methicillin resistant Staphylococcus aureus)    Status post right foot plantar deep infection with MRSA status post  I&D 10/2008   Myocardial infarction [facility])    "was told I'd had several before heart OR in 2002" (01/08/2018)    Noncompliance    Peripheral neuropathy    Retinopathy, diabetic, background (HCC)    Syncope    Type II diabetes mellitus (HCC)    requiring insulin    Vitreous hemorrhage, left (HCC)    and proliferative diabetic retinopathy  Current Outpatient Medications  Medication Sig Dispense Refill   acetaminophen (TYLENOL) 325 MG tablet Take by mouth.     atorvastatin (LIPITOR) 40 MG tablet Take 1 tablet (40 mg total) by mouth daily.     BD PEN NEEDLE NANO 2ND GEN 32G X 4 MM MISC USE TO INJECT INSULIN DAILY 100 each 0   Blood Glucose Monitoring Suppl (ONE TOUCH ULTRA 2) w/Device KIT 1 Device by Does not apply route 3 (three) times daily. 1 kit 0   cephALEXin (KEFLEX) 500 MG capsule Take 1 capsule (500 mg total) by mouth 2 (two) times daily. Take evening dose after dialysis on HD days. 120 capsule 1   gabapentin (NEURONTIN) 100 MG capsule TAKE 1 CAPSULE ( 100 MG) IN THE MORNING AND 2 CAPSULES ( 200 MG) AT BEDTIME 90 capsule 3   glucose blood (ACCU-CHEK GUIDE) test strip USE AS INSTRUCTED 3 TIMES DAILY 100 strip 1   insulin glargine (LANTUS SOLOSTAR) 100 UNIT/ML Solostar Pen Inject 60 Units into the skin at bedtime. (Patient taking differently: Inject 60 Units into the skin daily as needed.) 30 mL 11   midodrine (PROAMATINE) 10 MG tablet Take 2 tablets (20 mg total) by mouth 3 (three) times daily. Three times a day on dialysis days; NO midodrine on NON dialysis days 160 tablet 11   multivitamin (RENA-VIT) TABS tablet TAKE 1 TABLET BY MOUTH EVERYDAY AT BEDTIME 90 tablet 3   naphazoline-glycerin (CLEAR EYES REDNESS) 0.012-0.25 % SOLN Place 1-2 drops into the right eye 4 (four) times daily as needed for eye irritation.     OneTouch UltraSoft 2 Lancets MISC 1 each by Does not apply route 3 (three) times daily before meals. 100 each 1   pantoprazole (PROTONIX) 40 MG tablet TAKE 1 TABLET BY MOUTH EVERY DAY 90 tablet 3   polyethylene glycol powder (GLYCOLAX/MIRALAX) 17 GM/SCOOP powder      rosuvastatin  (CRESTOR) 10 MG tablet TAKE 1 TABLET BY MOUTH EVERY DAY 90 tablet 1   sevelamer (RENAGEL) 800 MG tablet Take 800 mg by mouth 3 (three) times daily.     traMADol (ULTRAM) 50 MG tablet Take 1 tablet (50 mg total) by mouth every 12 (twelve) hours as needed. 30 tablet 0   warfarin (COUMADIN) 3 MG tablet TAKE 4.5 MG (1.5 TABS) DAILY OR AS DIRECTED BY HF CLINIC 150 tablet 3   allopurinol (ZYLOPRIM) 300 MG tablet Take by mouth. (Patient not taking: Reported on 04/09/2023)     amiodarone (PACERONE) 200 MG tablet Take 1 tablet (200 mg total) by mouth 2 (two) times daily. Take 1 tablet (200 mg) by mouth twice daily for 2 weeks starting 04/23/23, then decrease to 1 tablet (200 mg) daily 05/07/23. 90 tablet 3   ondansetron (ZOFRAN) 4 MG tablet Take 1 tablet (4 mg total) by mouth every 8 (eight) hours as needed for nausea or vomiting. (Patient not taking: Reported on 04/09/2023) 30 tablet 3   No current facility-administered medications for this encounter.    Meclizine hcl and Ivabradine  REVIEW OF SYSTEMS: All systems negative except as listed in HPI, PMH and Problem list.   LVAD INTERROGATION:  Please see LVAD nurse's note above.   MAP 81  Physical Exam: General: Well appearing this am. NAD.  HEENT: Normal. Neck: Supple, JVP 7-8 cm. Carotids OK.  Cardiac:  Mechanical heart sounds with LVAD hum present.  Lungs:  CTAB, normal effort.  Abdomen:  NT, ND, no HSM. No bruits or masses. +BS  LVAD exit site:  Well-healed and incorporated. Dressing dry and intact. No erythema or drainage. Stabilization device present and accurately applied. Driveline dressing changed daily per sterile technique. Extremities:  Warm and dry. No cyanosis, clubbing, rash, or edema.  Neuro:  Alert & oriented x 3. Cranial nerves grossly intact. Moves all 4 extremities w/o difficulty. Affect pleasant    ASSESSMENT AND PLAN:  1. Chronic systolic CHF:  Ischemic cardiomyopathy. Has Medtronic ICD. Low output HF, admitted and Impella 5.5  placed 10/04/20.  HM3 LVAD was placed on 10/13/20. Developed post-op renal failure requiring eventual hemodialysis.  Ramp echo in 3/23, speed increased to 5800 rpm under echo guidance with increase in flow and stable PI.  Echo in 3/24 showed EF 20-25% with midline IV septum, mild RV dysfunction with severe RVE, moderate AI. MAP controlled today.  3 episodes of recent VT.  He does not look volume overloaded on exam.  LVAD interrogation shows stable PI events with HD, no low flows/suction.  - Repeat echo to reassess LV/RV function with more frequent VT.   - Continue midodrine on HD days and reminded him not to take midodrine on the non-HD days.    2. LVAD:  VAD interrogated personally.  Parameters stable with PI events on HD days. No low flow events or suction.  - He is now off ASA.  - Continue warfarin with INR goal 2-2.5.   3. CAD s/p CABG 2002:  Last cath in 6/19 with patent grafts. No chest pain.  - Continue Crestor.  4. ESRD: Currently dialyzing via tunneled catheter. No low flow events. He has had some nausea after dialysis.  - Continue HD M-W-F  - With continuous flow LVAD, think AV fistula would be unlikely to mature.   5. Atrial flutter/fibrillation: s/p DC-CV 08/21/18.  Has occasional episodes of AF but not prolonged.  - Continue amiodarone, recent LFTs and TSH normal.  He will need regular eye exam.  6. 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 This has been improving though not completely normal.   7. DM: Per primary care.  8. Anemia: CBC today, no evidence for overt bleeding.  9. Suspected Gastroparesis: He is no longer taking Reglan.  10. VT: Episode in 7/23 with successful ICD discharge.  No definite trigger, he did not actually feel the VT. VT again in 5/24 treated with ATP, amiodarone started.  Episodes of VT 9/11, 9/14, and 9/15.  No lightheadedness, just felt the shock. He does not look volume overloaded on exam. No  overt GI bleeding.  - Amiodarone increased to 200 mg bid, keep bid x 2 wks then back to daily.  - Echo to reassess LV/RV function.   - Will get full set of labs today.  - Will ask Dr. Graciela Husbands to review, should we add on mexiletine?  - Volume controlled with HD and does not appear volume overloaded on exam, do not think RHC will be particularly helpful.  Will start with echo.  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 Driveline site looks good today.   Followup with echo next week.   Marca Ancona 04/22/2023

## 2023-04-22 NOTE — Telephone Encounter (Signed)
Spoke with Revonda Standard.  Emailed copy of Hiatt Dascher full report secured through [employer] to her.  She will review with provider and patient at his appt today.  Multiple ATP, Shocks for sustained VT events. 9/11, 9/14, 9/15 Some appear as ? Torsades.    Patient being seen today by Dr. Shirlee Latch in VAD clinic who will follow up.  Will CC: to Dr. Graciela Husbands and Francis Dowse, PA-C (who is in hospital today).

## 2023-04-23 DIAGNOSIS — Z7901 Long term (current) use of anticoagulants: Secondary | ICD-10-CM | POA: Diagnosis not present

## 2023-04-24 ENCOUNTER — Telehealth (HOSPITAL_COMMUNITY): Payer: Self-pay | Admitting: *Deleted

## 2023-04-24 DIAGNOSIS — N186 End stage renal disease: Secondary | ICD-10-CM | POA: Diagnosis not present

## 2023-04-24 DIAGNOSIS — N2581 Secondary hyperparathyroidism of renal origin: Secondary | ICD-10-CM | POA: Diagnosis not present

## 2023-04-24 DIAGNOSIS — Z992 Dependence on renal dialysis: Secondary | ICD-10-CM | POA: Diagnosis not present

## 2023-04-24 NOTE — Telephone Encounter (Signed)
Spoke with pt's brother Cathey Endow and made aware of pt's f/u appt scheduled Thursday 9/26 at 10:00 with RAMP echo. He verbalized understanding.   Alyce Pagan RN VAD Coordinator  Office: [phone]  24/7 Pager: [phone]

## 2023-04-25 ENCOUNTER — Ambulatory Visit: Payer: Medicare HMO

## 2023-04-25 DIAGNOSIS — M2010 Hallux valgus (acquired), unspecified foot: Secondary | ICD-10-CM

## 2023-04-25 DIAGNOSIS — E1122 Type 2 diabetes mellitus with diabetic chronic kidney disease: Secondary | ICD-10-CM

## 2023-04-26 DIAGNOSIS — N186 End stage renal disease: Secondary | ICD-10-CM | POA: Diagnosis not present

## 2023-04-26 DIAGNOSIS — Z992 Dependence on renal dialysis: Secondary | ICD-10-CM | POA: Diagnosis not present

## 2023-04-26 DIAGNOSIS — N2581 Secondary hyperparathyroidism of renal origin: Secondary | ICD-10-CM | POA: Diagnosis not present

## 2023-04-27 NOTE — Progress Notes (Signed)
Patient presents to the office today for diabetic shoe and insole measuring.  Patient was measured with brannock device to determine size and width for 1 pair of extra depth shoes and foam casted for 3 pair of insoles.   Documentation of medical necessity will be sent to patient's treating diabetic doctor to verify and sign.   Patient's diabetic provider: Gershon Crane MD  Shoes and insoles will be ordered at that time and patient will be notified for an appointment for fitting when they arrive.   Shoe size (per patient): 11 Brannock measurement: 11 Patient shoe selection- Shoe choice:   [id number] / [id number] Shoe size ordered: 11WD ABN and Financials Signed  Heidi Maclin Cped< CFo, CFm

## 2023-04-29 ENCOUNTER — Other Ambulatory Visit (HOSPITAL_COMMUNITY): Payer: Self-pay

## 2023-04-29 DIAGNOSIS — N2581 Secondary hyperparathyroidism of renal origin: Secondary | ICD-10-CM | POA: Diagnosis not present

## 2023-04-29 DIAGNOSIS — Z992 Dependence on renal dialysis: Secondary | ICD-10-CM | POA: Diagnosis not present

## 2023-04-29 DIAGNOSIS — N186 End stage renal disease: Secondary | ICD-10-CM | POA: Diagnosis not present

## 2023-04-29 MED ORDER — MEXILETINE HCL 150 MG PO CAPS: 150.0000 mg | ORAL_CAPSULE | Freq: Two times a day (BID) | ORAL | 3 refills | Status: DC

## 2023-04-30 ENCOUNTER — Ambulatory Visit (HOSPITAL_COMMUNITY): Payer: Self-pay | Admitting: Pharmacist

## 2023-04-30 LAB — POCT INR: INR: 2.3 (ref 2.0–3.0)
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
## 2023-05-01 ENCOUNTER — Encounter (HOSPITAL_COMMUNITY): Payer: Medicare HMO | Admitting: Cardiology

## 2023-05-01 ENCOUNTER — Other Ambulatory Visit (HOSPITAL_COMMUNITY): Payer: Self-pay

## 2023-05-01 ENCOUNTER — Telehealth (HOSPITAL_COMMUNITY): Payer: Self-pay | Admitting: Pharmacy Technician

## 2023-05-01 ENCOUNTER — Other Ambulatory Visit (HOSPITAL_COMMUNITY): Payer: Medicare HMO

## 2023-05-01 DIAGNOSIS — N2581 Secondary hyperparathyroidism of renal origin: Secondary | ICD-10-CM | POA: Diagnosis not present

## 2023-05-01 DIAGNOSIS — Z992 Dependence on renal dialysis: Secondary | ICD-10-CM | POA: Diagnosis not present

## 2023-05-01 DIAGNOSIS — Z7901 Long term (current) use of anticoagulants: Secondary | ICD-10-CM

## 2023-05-01 DIAGNOSIS — N186 End stage renal disease: Secondary | ICD-10-CM | POA: Diagnosis not present

## 2023-05-01 DIAGNOSIS — Z95811 Presence of heart assist device: Secondary | ICD-10-CM

## 2023-05-01 NOTE — Telephone Encounter (Signed)
Patient Advocate Encounter   Received notification from [facility] that prior authorization for Mexiletine is required.   PA submitted on CoverMyMeds Key [id number] Status is pending   Will continue to follow.

## 2023-05-02 ENCOUNTER — Ambulatory Visit (HOSPITAL_BASED_OUTPATIENT_CLINIC_OR_DEPARTMENT_OTHER)
Admission: RE | Admit: 2023-05-02 | Discharge: 2023-05-02 | Disposition: A | Discharge status: Home / Self Care | Payer: Medicare HMO | Source: Ambulatory Visit | Attending: Cardiology | Admitting: Cardiology

## 2023-05-02 ENCOUNTER — Encounter (HOSPITAL_COMMUNITY): Payer: Self-pay | Admitting: Cardiology

## 2023-05-02 ENCOUNTER — Ambulatory Visit (HOSPITAL_COMMUNITY)
Admission: RE | Admit: 2023-05-02 | Discharge: 2023-05-02 | Disposition: A | Discharge status: Home / Self Care | Payer: Medicare HMO | Source: Ambulatory Visit | Attending: Cardiology | Admitting: Cardiology

## 2023-05-02 ENCOUNTER — Other Ambulatory Visit (HOSPITAL_COMMUNITY): Payer: Self-pay

## 2023-05-02 ENCOUNTER — Ambulatory Visit (HOSPITAL_COMMUNITY): Payer: Self-pay | Admitting: Pharmacist

## 2023-05-02 ENCOUNTER — Other Ambulatory Visit (HOSPITAL_COMMUNITY): Payer: Self-pay | Admitting: Pharmacist

## 2023-05-02 DIAGNOSIS — N186 End stage renal disease: Secondary | ICD-10-CM | POA: Insufficient documentation

## 2023-05-02 DIAGNOSIS — I255 Ischemic cardiomyopathy: Secondary | ICD-10-CM | POA: Diagnosis not present

## 2023-05-02 DIAGNOSIS — M79641 Pain in right hand: Secondary | ICD-10-CM | POA: Insufficient documentation

## 2023-05-02 DIAGNOSIS — Z95811 Presence of heart assist device: Secondary | ICD-10-CM | POA: Insufficient documentation

## 2023-05-02 DIAGNOSIS — D631 Anemia in chronic kidney disease: Secondary | ICD-10-CM | POA: Diagnosis not present

## 2023-05-02 DIAGNOSIS — I472 Ventricular tachycardia, unspecified: Secondary | ICD-10-CM | POA: Diagnosis not present

## 2023-05-02 DIAGNOSIS — Z7901 Long term (current) use of anticoagulants: Secondary | ICD-10-CM | POA: Diagnosis not present

## 2023-05-02 DIAGNOSIS — I132 Hypertensive heart and chronic kidney disease with heart failure and with stage 5 chronic kidney disease, or end stage renal disease: Secondary | ICD-10-CM | POA: Insufficient documentation

## 2023-05-02 DIAGNOSIS — I5042 Chronic combined systolic (congestive) and diastolic (congestive) heart failure: Secondary | ICD-10-CM | POA: Diagnosis not present

## 2023-05-02 DIAGNOSIS — Z4502 Encounter for adjustment and management of automatic implantable cardiac defibrillator: Secondary | ICD-10-CM | POA: Insufficient documentation

## 2023-05-02 DIAGNOSIS — Z992 Dependence on renal dialysis: Secondary | ICD-10-CM | POA: Insufficient documentation

## 2023-05-02 DIAGNOSIS — I4892 Unspecified atrial flutter: Secondary | ICD-10-CM | POA: Diagnosis not present

## 2023-05-02 DIAGNOSIS — Z792 Long term (current) use of antibiotics: Secondary | ICD-10-CM | POA: Diagnosis not present

## 2023-05-02 DIAGNOSIS — I351 Nonrheumatic aortic (valve) insufficiency: Secondary | ICD-10-CM | POA: Diagnosis not present

## 2023-05-02 DIAGNOSIS — E1122 Type 2 diabetes mellitus with diabetic chronic kidney disease: Secondary | ICD-10-CM | POA: Diagnosis not present

## 2023-05-02 DIAGNOSIS — Z4509 Encounter for adjustment and management of other cardiac device: Secondary | ICD-10-CM | POA: Insufficient documentation

## 2023-05-02 DIAGNOSIS — I2510 Atherosclerotic heart disease of native coronary artery without angina pectoris: Secondary | ICD-10-CM | POA: Insufficient documentation

## 2023-05-02 DIAGNOSIS — Z79899 Other long term (current) drug therapy: Secondary | ICD-10-CM | POA: Diagnosis not present

## 2023-05-02 DIAGNOSIS — I5022 Chronic systolic (congestive) heart failure: Secondary | ICD-10-CM | POA: Diagnosis not present

## 2023-05-02 DIAGNOSIS — Z794 Long term (current) use of insulin: Secondary | ICD-10-CM | POA: Insufficient documentation

## 2023-05-02 DIAGNOSIS — I4891 Unspecified atrial fibrillation: Secondary | ICD-10-CM | POA: Insufficient documentation

## 2023-05-02 DIAGNOSIS — I252 Old myocardial infarction: Secondary | ICD-10-CM | POA: Diagnosis not present

## 2023-05-02 DIAGNOSIS — Z951 Presence of aortocoronary bypass graft: Secondary | ICD-10-CM | POA: Diagnosis not present

## 2023-05-02 DIAGNOSIS — Z8616 Personal history of COVID-19: Secondary | ICD-10-CM | POA: Diagnosis not present

## 2023-05-02 DIAGNOSIS — B9561 Methicillin susceptible Staphylococcus aureus infection as the cause of diseases classified elsewhere: Secondary | ICD-10-CM | POA: Insufficient documentation

## 2023-05-02 DIAGNOSIS — T827XXD Infection and inflammatory reaction due to other cardiac and vascular devices, implants and grafts, subsequent encounter: Secondary | ICD-10-CM | POA: Diagnosis not present

## 2023-05-02 DIAGNOSIS — G6289 Other specified polyneuropathies: Secondary | ICD-10-CM | POA: Insufficient documentation

## 2023-05-02 LAB — CBC
HCT: 38.9 % — ABNORMAL LOW (ref 39.0–52.0)
Hemoglobin: 12 g/dL — ABNORMAL LOW (ref 13.0–17.0)
MCH: 28.8 pg (ref 26.0–34.0)
MCHC: 30.8 g/dL (ref 30.0–36.0)
MCV: 93.3 fL (ref 80.0–100.0)
Platelets: 194 10*3/uL (ref 150–400)
RBC: 4.17 MIL/uL — ABNORMAL LOW (ref 4.22–5.81)
RDW: 17.3 % — ABNORMAL HIGH (ref 11.5–15.5)
WBC: 9.2 10*3/uL (ref 4.0–10.5)
nRBC: 0 % (ref 0.0–0.2)

## 2023-05-02 LAB — COMPREHENSIVE METABOLIC PANEL
ALT: 19 U/L (ref 0–44)
AST: 27 U/L (ref 15–41)
Albumin: 4.4 g/dL (ref 3.5–5.0)
Alkaline Phosphatase: 83 U/L (ref 38–126)
Anion gap: 18 — ABNORMAL HIGH (ref 5–15)
BUN: 28 mg/dL — ABNORMAL HIGH (ref 8–23)
CO2: 24 mmol/L (ref 22–32)
Calcium: 8.5 mg/dL — ABNORMAL LOW (ref 8.9–10.3)
Chloride: 97 mmol/L — ABNORMAL LOW (ref 98–111)
Creatinine, Ser: 5.74 mg/dL — ABNORMAL HIGH (ref 0.61–1.24)
GFR, Estimated: 10 mL/min — ABNORMAL LOW (ref >60)
Glucose, Bld: 116 mg/dL — ABNORMAL HIGH (ref 70–99)
Potassium: 4.1 mmol/L (ref 3.5–5.1)
Sodium: 139 mmol/L (ref 135–145)
Total Bilirubin: 0.8 mg/dL (ref 0.3–1.2)
Total Protein: 8.4 g/dL — ABNORMAL HIGH (ref 6.5–8.1)

## 2023-05-02 LAB — PROTIME-INR
INR: 1.9 — ABNORMAL HIGH (ref 0.8–1.2)
Prothrombin Time: 21.9 seconds — ABNORMAL HIGH (ref 11.4–15.2)

## 2023-05-02 LAB — ECHOCARDIOGRAM LIMITED: S' Lateral: 4.3 cm

## 2023-05-02 LAB — LACTATE DEHYDROGENASE: LDH: 215 U/L — ABNORMAL HIGH (ref 98–192)

## 2023-05-02 MED ORDER — MEXILETINE HCL 150 MG PO CAPS: 150.0000 mg | ORAL_CAPSULE | Freq: Two times a day (BID) | ORAL | 3 refills | Status: DC

## 2023-05-02 NOTE — Progress Notes (Signed)
Speed  Flow  PI  Power  LVIDD  AI  Aortic opening MR  TR  Septum  RV  VTI (>18cm)  5700 5.0 3.6 4.6 4.9 mod 5/5   mid mod   5800 5.4  3.1 4.7 5.4 mod 1/5 none  mod  mid mod     5900 5.4 3.1 5.1 4.5 mod 0/5 none mod Pulling in mod    6000 5.4 3.3 5.2 5.1 mod 4/5 trivial mod Pulling in mod                                              Doppler MAP: 90 Auto cuff BP: 110/86 (95)   Ramp ECHO performed at bedside per Dr.McLean  At completion of ramp study, patients primary controller programmed:  Fixed speed: 5700 Low speed limit: 5400  Ernesta Amble, [facility] Coordinator 24/7 pager [phone]

## 2023-05-02 NOTE — Patient Instructions (Signed)
Please pick up Mexiletine today Return to [facility] in 2 months

## 2023-05-02 NOTE — Telephone Encounter (Signed)
Advanced Heart Failure Patient Advocate Encounter  Prior Authorization for Mexiletine has been approved.    PA# [id number] Effective dates: 05/01/23 through 08/06/23  Patients co-pay is $19.20 (30 days), $57 (90 days)  Archer Asa, CPhT

## 2023-05-02 NOTE — Progress Notes (Addendum)
Patient presents for 1 week follow up with RAMP ECHO. Reports no problems with VAD equipment or concerns with drive line.  Patient arrived via wheelchair today but was able to step on the scale for his weight. Pt denies falls, heart failure symptoms, or signs of bleeding. Complains of fatigue on dialysis days and poor appetite the day following. Pt was shocked- 9/11, 9/14, & 9/15 for sustained VT events rates as high at 330. Received several ATP attempts prior to shocks. Shocked appropriately with return to SR. Pt given instructed to increase Amiodarone to 400mg  in the morning and 200mg  in the evening for 2 days and then decreased to 200mg  BID. Pt confirms he is taking 200mg  twice a day at today's visit. Pt acknowledged prior instructions to decrease to 200mg  daily 10/1.   Mexiletine 150mg  BID added Monday. Prior Authorization approved for medication today. Pt states he will pick medication up on his way home today. Medtronic ICD interrogated at today's visit. Reviewed with Dr. Shirlee Latch no recurrent episodes of VT/VF.   Patient reports he continues M/W/F dialysis. Pt confirms he takes Midodrine TID on HD days only.   Pt on Keflex 500 mg BID (dosed for HD schedule). Marcus Johnson reports they are changing dressing weekly. Currently wearing 2 anchors. Reports he wears binder at home, but does not like to wear during dialysis.   Dry weight at HD: 87.5 kg. Pt states they typically pull 4 L at each treatment.   Ramp Echo images reviewed with Marcus Johnson in [facility]. Speed reduced to 5700. Please see separate note for details.   Vital Signs:  Doppler Pressure: 90 Automatic BP: 110/86 (95) HR: 78 NSR O2: 98% RA   Weight: 202.8 lbs w/ eqt Last wt: 207.5 w/ eqt   VAD Indication: Destination Therapy due to CKD Stage 3b   LVAD assessment:  HM III: Speed: 5800 rpms Flow: 5.4 Power: 4.7 w    PI: 3.1 Alarms: none Events: 10-20  Fixed speed:  5800>>5700 Low speed limit:  5500>>5400  Primary controller:  Back up battery due for replacement in 26 months Secondary controller: Back up battery due for replacement in 32 months  I reviewed the LVAD parameters from today and compared the results to the patient's prior recorded data. LVAD interrogation was NEGATIVE for significant power changes, NEGATIVE for clinical alarms and STABLE for PI events/speed drops. No programming changes were made and pump is functioning within specified parameters. Pt is performing daily controller and system monitor self tests along with completing weekly and monthly maintenance for LVAD equipment.   LVAD equipment check completed and is in good working order. Back-up equipment present.    Annual Equipment Maintenance on UBC/PM was performed 10/23/22.  Exit Site Care: Dressing maintained weekly by pt's brother. Dressing is clean, dry, and intact. Anchors correctly applied. Pt denies fever or chills. May continue weekly dressing using daily kits. Pt has adequate supplies for home use.   Device: Medtronic single Therapies: VF: 240 VT: 200 Slow VT: 143 Pacing: VVI 40 Last check: 05/02/23   BP & Labs:  Doppler 90 - Doppler is reflecting MAP  Hgb 12.0 - No S/S of bleeding. Specifically denies melena/BRBPR or nosebleeds.   LDH 215- established baseline of 160 - 180. Denies tea-colored urine. No power elevations noted on interrogation.   Plan: Decrease Amiodarone to 200 mg daily on 05/07/23. Coumadin dosing per Marcus Johnson D Return to V[facility] Clinic in 2 months for follow up with Dr.Nuh Lipton  [name] RN,BSN VAD Coordinator  Office: [phone]  24/7 Pager: [phone]   Follow up for Heart Failure/LVAD:  Marcus Johnson. is a 62 y.o. male who has a h/o DM2, CAD s/p CABG 2002, systolic HF due to ischemic cardiomyopathy with EF 20-25% (echo 12/15), DM2 and CKD. He is s/p Medtronic single chamber ICD.   Admitted in 12/15 due to ADHF. Required short course milrinone for diuresis. Diuresed 30 pounds.    CPX  2/16 showed severely reduced functional capacity.  There was a significant disconnect between his symptoms and the CPX.  RHC in 6/16 showed fairly normal filling pressures and low but not markedly low cardiac output.    CPX (4/19) was submaximal but suggested severe limitation from HF.    He was admitted in 6/19 with marked volume overload and dyspnea.  Echo in 6/19 showed EF 20% with severe global hypokinesis.  LHC/RHC showed severe native vessel CAD with patent LIMA-D, SVG-OM, and SVG-PLV; cardiac index low.  He was started on milrinone and diuresed.  We discussed LVAD extensively in light of low output, creatinine that is trending up and concern for decompensation of RV over time. He was adamant that he did not want to pursue LVAD workup yet and did not want a referral for transplant evaluation. Milrinone was weaned off and he was discharged home.    Admitted 08/01/18-08/07/18 with volume overload and left foot wound. Diuresed 38 lbs with IV lasix and then transitioned back to torsemide 100 mg BID. ID was consulted with concerns for left foot osteomyelitis noted on CT scan. He got IV ancef q8 hours x 42 days. [facility] provided teaching and supplies for home antibiotics. Course complicated by AKI. DC weight 200 lbs.    He went into atrial flutter and had DCCV to NSR in 1/20.    Echo in 4/21 showed EF 25% with apical akinesis and diffuse hypokinesis relatively preserved in lateral wall, mildly decreased RV systolic function, PASP 41 mmHg. CPX in 5/21 showed severe functional limitation suggestive of advanced HF. 5/21 Zio patch showed short atrial flutter runs, 3.8% PVCs.    He developed COVID-19 infection in 1/22.  Symptoms were predominantly GI.    He developed progressive worsening of CHF in early 2022 and was admitted in 3/22 with low output HF.  Impella 5.5 was placed as bridge to Heartmate III LVAD placed in 10/13/20.  He developed post-op renal failure and ended up on dialysis.  He was markedly  deconditioned post-op and went to CIR.    Still has pain and minimal function in his right hand.  Nerve conduction study showed a chronic sensorimotor neuropathy in the right arm.    Ramp echo in 3/23 showed severe LV dysfunction with mild RV enlargement and mild dysfunction, mild-moderate AI.  Speed was increased to 5800 rpm.  The aortic valve still opened with every beat but only slightly. Flow increased from 4.1 to 5.3 L/min and PI remained stable.   Patient was admitted in 4/23 with falls and lightheadedness. He fractured his left foot when falling and initially was unable to walk.  He was thought to be hypotensive due to over-diuresis and dry weight was increased.  Midodrine on HD days was also increased.  He was treated for a driveline infection with vancomycin then doxycycline, now off.   ICD shock for VT in 7/23, not started on amiodarone given only 1 episode.   Echo in 3/24 showed EF 20-25% with midline IV septum, mild RV dysfunction with severe RVE, moderate AI.  In 5/24, patient had VT treated with ATP.  He was started on amiodarone.   Patient was shocked by his ICD for VT on 9/11, 9/14, and 9/15.  I had him increase his amiodarone to 200 mg bid x 2 wks then back to 200 mg daily.  After discussion with Dr. Graciela Husbands, I started him on mexiletine as well.   Ramp echo was done today.  Starting speed 5800 rpm with moderate AI, slight aortic valve opening each beat, moderate RV enlargement/moderate RV dysfunction, septum shifted slightly to the left.  Speed decreased to 5700 rpm with moderate AI, aortic valve opening each beat, midline septum with flow fairly stable at 5 L/min.  I left speed at 5700 rpm.   Patient comes for followup of LVAD.  No further VT/shocks.  Ramp echo as above.  He reports no dyspnea walking on flat ground.  Able to do all ADLs.  He still gets tired after HD but tolerates it relatively well.   Labs (5/22): LDH 174, hgb 10.6 Labs (7/22): LDH 164 Labs (8/22): hgb  11.4 Labs (10/22): hgb 12 Labs (11/22): hgb 12.2 Labs (4/23): LDL 163, hgb 10.4 Labs (6/23): hgb 12.2, LDH 284 Labs (7/23): LFTs normal, hgb 11.3, LDH 235 Labs (9/23): hgb 10.2 Labs (1/24): hgb 10.6, LDH 279 Labs (3/24): LDH 222, hgb 11.2 Labs (6/24): LDH 219, hgb 11.3, TSH normal Labs (9/24): TSH normal, hgb 11.5, LFTs normal, LDH 215  Past Medical History:  Diagnosis Date   AICD (automatic cardioverter/defibrillator) present    Single-chamber  implantable cardiac defibrillator - Medtronic   Atrial fibrillation (HCC)    Cataract    Mixed form OU   CHF (congestive heart failure) (HCC)    Chronic kidney disease    Chronic kidney disease (CKD), stage III (moderate) (HCC)    Chronic systolic heart failure (HCC)    a. Echo 5/13: Mild LVE, mild LVH, EF 10%, anteroseptal, lateral, apical AK, mild MR, mild LAE, moderately reduced RVSF, mild RAE, PASP 60;  b. 07/2014 Echo: EF 20-2%, diff HK, AKI of antsep/apical/mid-apicalinferior, mod reduced RV.   Coronary artery disease    a. s/p CABG 2002 b. LHC 5/13:  dLM 80%, LAD subtotally occluded, pCFX occluded, pRCA 50%, mid? Occlusion with high take off of the PDA with 70% multiple lesions-not bypassed and supplies collaterals to LAD, LIMA-IM/ramus ok, S-OM ok, S-PLA branches ok. Medical therapy was recommended   COVID    Diabetic retinopathy (HCC)    NPDR OD, PDR OS   Dyspnea    Gout    "on daily RX" (01/08/2018)   Hypertension    Hypertensive retinopathy    OU   Ischemic cardiomyopathy    a. Echo 5/13: Mild LVE, mild LVH, EF 10%, anteroseptal, lateral, apical AK, mild MR, mild LAE, moderately reduced RVSF, mild RAE, PASP 60;  b. 01/2012 s/p MDT [id number] Protecta XT VR AICD;  c. 07/2014 Echo: EF 20-2%, diff HK, AKI of antsep/apical/mid-apicalinferior, mod reduced RV.   MRSA (methicillin resistant Staphylococcus aureus)    Status post right foot plantar deep infection with MRSA status post  I&D 10/2008   Myocardial infarction [facility])    "was told  I'd had several before heart OR in 2002" (01/08/2018)   Noncompliance    Peripheral neuropathy    Retinopathy, diabetic, background (HCC)    Syncope    Type II diabetes mellitus (HCC)    requiring insulin    Vitreous hemorrhage, left (HCC)    and proliferative diabetic retinopathy  Current Outpatient Medications  Medication Sig Dispense Refill   acetaminophen (TYLENOL) 325 MG tablet Take by mouth.     allopurinol (ZYLOPRIM) 300 MG tablet Take by mouth.     amiodarone (PACERONE) 200 MG tablet Take 1 tablet (200 mg total) by mouth 2 (two) times daily. Take 1 tablet (200 mg) by mouth twice daily for 2 weeks starting 04/23/23, then decrease to 1 tablet (200 mg) daily 05/07/23. 90 tablet 3   atorvastatin (LIPITOR) 40 MG tablet Take 1 tablet (40 mg total) by mouth daily.     BD PEN NEEDLE NANO 2ND GEN 32G X 4 MM MISC USE TO INJECT INSULIN DAILY 100 each 0   Blood Glucose Monitoring Suppl (ONE TOUCH ULTRA 2) w/Device KIT 1 Device by Does not apply route 3 (three) times daily. 1 kit 0   cephALEXin (KEFLEX) 500 MG capsule Take 1 capsule (500 mg total) by mouth 2 (two) times daily. Take evening dose after dialysis on HD days. 120 capsule 1   gabapentin (NEURONTIN) 100 MG capsule TAKE 1 CAPSULE ( 100 MG) IN THE MORNING AND 2 CAPSULES ( 200 MG) AT BEDTIME 90 capsule 3   glucose blood (ACCU-CHEK GUIDE) test strip USE AS INSTRUCTED 3 TIMES DAILY 100 strip 1   insulin glargine (LANTUS SOLOSTAR) 100 UNIT/ML Solostar Pen Inject 60 Units into the skin at bedtime. (Patient taking differently: Inject 60 Units into the skin daily as needed.) 30 mL 11   mexiletine (MEXITIL) 150 MG capsule Take 1 capsule (150 mg total) by mouth 2 (two) times daily. 180 capsule 3   midodrine (PROAMATINE) 10 MG tablet Take 2 tablets (20 mg total) by mouth 3 (three) times daily. Three times a day on dialysis days; NO midodrine on NON dialysis days 160 tablet 11   multivitamin (RENA-VIT) TABS tablet TAKE 1 TABLET BY MOUTH EVERYDAY AT  BEDTIME 90 tablet 3   naphazoline-glycerin (CLEAR EYES REDNESS) 0.012-0.25 % SOLN Place 1-2 drops into the right eye 4 (four) times daily as needed for eye irritation.     OneTouch UltraSoft 2 Lancets MISC 1 each by Does not apply route 3 (three) times daily before meals. 100 each 1   pantoprazole (PROTONIX) 40 MG tablet TAKE 1 TABLET BY MOUTH EVERY DAY 90 tablet 3   polyethylene glycol powder (GLYCOLAX/MIRALAX) 17 GM/SCOOP powder      rosuvastatin (CRESTOR) 10 MG tablet TAKE 1 TABLET BY MOUTH EVERY DAY 90 tablet 1   sevelamer (RENAGEL) 800 MG tablet Take 800 mg by mouth 3 (three) times daily.     traMADol (ULTRAM) 50 MG tablet Take 1 tablet (50 mg total) by mouth every 12 (twelve) hours as needed. 30 tablet 0   warfarin (COUMADIN) 3 MG tablet TAKE 4.5 MG (1.5 TABS) DAILY OR AS DIRECTED BY HF CLINIC 150 tablet 3   ondansetron (ZOFRAN) 4 MG tablet Take 1 tablet (4 mg total) by mouth every 8 (eight) hours as needed for nausea or vomiting. (Patient not taking: Reported on 04/09/2023) 30 tablet 3   No current facility-administered medications for this encounter.    Meclizine hcl and Ivabradine  REVIEW OF SYSTEMS: All systems negative except as listed in HPI, PMH and Problem list.   LVAD INTERROGATION:  Please see LVAD nurse's note above.   BP (!) 90/0   Pulse 78   Wt 92 kg (202 lb 12.8 oz)   SpO2 98%   BMI 29.10 kg/m   Physical Exam: General: Well appearing this am. NAD.  HEENT:  Normal. Neck: Supple, JVP 7-8 cm. Carotids OK.  Cardiac:  Mechanical heart sounds with LVAD hum present.  Lungs:  CTAB, normal effort.  Abdomen:  NT, ND, no HSM. No bruits or masses. +BS  LVAD exit site: Well-healed and incorporated. Dressing dry and intact. No erythema or drainage. Stabilization device present and accurately applied. Driveline dressing changed daily per sterile technique. Extremities:  Warm and dry. No cyanosis, clubbing, rash, or edema.  Neuro:  Alert & oriented x 3. Cranial nerves grossly  intact. Moves all 4 extremities w/o difficulty. Affect pleasant    ASSESSMENT AND PLAN:  1. Chronic systolic CHF:  Ischemic cardiomyopathy. Has Medtronic ICD. Low output HF, admitted and Impella 5.5 placed 10/04/20.  HM3 LVAD was placed on 10/13/20. Developed post-op renal failure requiring eventual hemodialysis.  Ramp echo in 3/23, speed increased to 5800 rpm under echo guidance with increase in flow and stable PI.  Ramp echo was done again today due to episodes of VT; starting speed 5800 rpm with moderate AI, slight aortic valve opening each beat, moderate RV enlargement/moderate RV dysfunction, septum shifted slightly to the left.  Speed decreased to 5700 rpm with moderate AI, aortic valve opening each beat, midline septum with flow fairly stable at 5 L/min. I left speed at 5700 rpm. He has had no further VT.  NYHA class II.   - Continue midodrine on HD days and reminded him not to take midodrine on the non-HD days.    2. LVAD:  VAD interrogated personally.  Parameters stable with PI events on HD days. No low flow events or suction.  - He is now off ASA.  - Continue warfarin with INR goal 2-2.5.   3. CAD s/p CABG 2002:  Last cath in 6/19 with patent grafts. No chest pain.  - Continue Crestor.  4. ESRD: Currently dialyzing via tunneled catheter. No low flow events. He has had some nausea after dialysis.  - Continue HD M-W-F  - With continuous flow LVAD, think AV fistula would be unlikely to mature.   5. Atrial flutter/fibrillation: s/p DC-CV 08/21/18.  Has occasional episodes of AF but not prolonged.  - Continue amiodarone, recent LFTs and TSH normal.  He will need regular eye exam.  6. 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 This has been improving though not completely normal.   7. DM: Per primary care.  8. Anemia: CBC today, no evidence for overt bleeding.  9. Suspected Gastroparesis: He is no longer taking Reglan.  10.  VT: Episode in 7/23 with successful ICD discharge.  No definite trigger, he did not actually feel the VT. VT again in 5/24 treated with ATP, amiodarone started.  Episodes of VT 9/11, 9/14, and 9/15.  No lightheadedness, just felt the shock. He does not look volume overloaded on exam. No overt GI bleeding.  - Septum mildly to right on echo with LVAD speed 5800 rpm, speed decreased to 5700 rpm with midline septum.  - Amiodarone decrease amiodarone back to once daily on 05/07/23.  - Continue mexiletine 150 mg bid.  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 Driveline site looks good today.   Followup in 2 months.   Marca Ancona 05/02/2023

## 2023-05-03 DIAGNOSIS — N186 End stage renal disease: Secondary | ICD-10-CM | POA: Diagnosis not present

## 2023-05-03 DIAGNOSIS — Z992 Dependence on renal dialysis: Secondary | ICD-10-CM | POA: Diagnosis not present

## 2023-05-03 DIAGNOSIS — N2581 Secondary hyperparathyroidism of renal origin: Secondary | ICD-10-CM | POA: Diagnosis not present

## 2023-05-06 ENCOUNTER — Telehealth: Payer: Self-pay

## 2023-05-06 ENCOUNTER — Other Ambulatory Visit: Payer: Self-pay

## 2023-05-06 DIAGNOSIS — I509 Heart failure, unspecified: Secondary | ICD-10-CM | POA: Diagnosis not present

## 2023-05-06 DIAGNOSIS — Z992 Dependence on renal dialysis: Secondary | ICD-10-CM | POA: Diagnosis not present

## 2023-05-06 DIAGNOSIS — N186 End stage renal disease: Secondary | ICD-10-CM | POA: Diagnosis not present

## 2023-05-06 NOTE — Telephone Encounter (Signed)
Pt paged VAD Coordinator this morning complaining of GI upset since Friday. He states he started his Mexiletine 150mg  BID Thursday afternoon and ever since reports nausea, vomiting and overall fatigue. Discussed with Dr. Shirlee Latch and Leotis Shames. Pt advised to take medication 30 minutes after eating and also with an antiacid like OTC TUMS. Pt instructed to call VAD Coordinator back if symptoms have not resolved.   [name] RN, BSN VAD Coordinator 24/7 Pager [phone]

## 2023-05-07 ENCOUNTER — Ambulatory Visit (HOSPITAL_COMMUNITY): Payer: Self-pay | Admitting: Pharmacist

## 2023-05-07 LAB — POCT INR: INR: 3.2 — AB (ref 2.0–3.0)

## 2023-05-08 DIAGNOSIS — N2581 Secondary hyperparathyroidism of renal origin: Secondary | ICD-10-CM | POA: Diagnosis not present

## 2023-05-08 DIAGNOSIS — Z992 Dependence on renal dialysis: Secondary | ICD-10-CM | POA: Diagnosis not present

## 2023-05-08 DIAGNOSIS — N186 End stage renal disease: Secondary | ICD-10-CM | POA: Diagnosis not present

## 2023-05-09 ENCOUNTER — Encounter (INDEPENDENT_AMBULATORY_CARE_PROVIDER_SITE_OTHER): Payer: Self-pay | Admitting: Ophthalmology

## 2023-05-09 ENCOUNTER — Ambulatory Visit (INDEPENDENT_AMBULATORY_CARE_PROVIDER_SITE_OTHER): Payer: Medicare HMO | Admitting: Ophthalmology

## 2023-05-09 DIAGNOSIS — E113513 Type 2 diabetes mellitus with proliferative diabetic retinopathy with macular edema, bilateral: Secondary | ICD-10-CM | POA: Diagnosis not present

## 2023-05-09 DIAGNOSIS — Z794 Long term (current) use of insulin: Secondary | ICD-10-CM | POA: Diagnosis not present

## 2023-05-09 DIAGNOSIS — H4312 Vitreous hemorrhage, left eye: Secondary | ICD-10-CM

## 2023-05-09 DIAGNOSIS — H25813 Combined forms of age-related cataract, bilateral: Secondary | ICD-10-CM | POA: Diagnosis not present

## 2023-05-09 DIAGNOSIS — H35033 Hypertensive retinopathy, bilateral: Secondary | ICD-10-CM | POA: Diagnosis not present

## 2023-05-09 DIAGNOSIS — I10 Essential (primary) hypertension: Secondary | ICD-10-CM | POA: Diagnosis not present

## 2023-05-09 MED ORDER — AFLIBERCEPT 2MG/0.05ML IZ SOLN FOR KALEIDOSCOPE
2.0000 mg | INTRAVITREAL | Status: AC | PRN
Start: 2023-05-09 — End: 2023-05-09
  Administered 2023-05-09: 2 mg via INTRAVITREAL

## 2023-05-10 ENCOUNTER — Other Ambulatory Visit: Payer: Self-pay | Admitting: Family Medicine

## 2023-05-10 DIAGNOSIS — N186 End stage renal disease: Secondary | ICD-10-CM | POA: Diagnosis not present

## 2023-05-10 DIAGNOSIS — Z1212 Encounter for screening for malignant neoplasm of rectum: Secondary | ICD-10-CM

## 2023-05-10 DIAGNOSIS — N2581 Secondary hyperparathyroidism of renal origin: Secondary | ICD-10-CM | POA: Diagnosis not present

## 2023-05-10 DIAGNOSIS — Z992 Dependence on renal dialysis: Secondary | ICD-10-CM | POA: Diagnosis not present

## 2023-05-10 DIAGNOSIS — Z1211 Encounter for screening for malignant neoplasm of colon: Secondary | ICD-10-CM

## 2023-05-13 DIAGNOSIS — N186 End stage renal disease: Secondary | ICD-10-CM | POA: Diagnosis not present

## 2023-05-13 DIAGNOSIS — Z992 Dependence on renal dialysis: Secondary | ICD-10-CM | POA: Diagnosis not present

## 2023-05-13 DIAGNOSIS — N2581 Secondary hyperparathyroidism of renal origin: Secondary | ICD-10-CM | POA: Diagnosis not present

## 2023-05-14 ENCOUNTER — Ambulatory Visit (HOSPITAL_COMMUNITY): Payer: Self-pay | Admitting: Pharmacist

## 2023-05-14 LAB — POCT INR: INR: 3.2 — AB (ref 2.0–3.0)

## 2023-05-15 DIAGNOSIS — Z992 Dependence on renal dialysis: Secondary | ICD-10-CM | POA: Diagnosis not present

## 2023-05-15 DIAGNOSIS — N2581 Secondary hyperparathyroidism of renal origin: Secondary | ICD-10-CM | POA: Diagnosis not present

## 2023-05-15 DIAGNOSIS — N186 End stage renal disease: Secondary | ICD-10-CM | POA: Diagnosis not present

## 2023-05-17 DIAGNOSIS — N186 End stage renal disease: Secondary | ICD-10-CM | POA: Diagnosis not present

## 2023-05-17 DIAGNOSIS — N2581 Secondary hyperparathyroidism of renal origin: Secondary | ICD-10-CM | POA: Diagnosis not present

## 2023-05-17 DIAGNOSIS — Z992 Dependence on renal dialysis: Secondary | ICD-10-CM | POA: Diagnosis not present

## 2023-05-20 DIAGNOSIS — Z992 Dependence on renal dialysis: Secondary | ICD-10-CM | POA: Diagnosis not present

## 2023-05-20 DIAGNOSIS — N186 End stage renal disease: Secondary | ICD-10-CM | POA: Diagnosis not present

## 2023-05-20 DIAGNOSIS — N2581 Secondary hyperparathyroidism of renal origin: Secondary | ICD-10-CM | POA: Diagnosis not present

## 2023-05-20 NOTE — Telephone Encounter (Signed)
Called pt several times,lefta voice message with Dr Clent Ridges advise for pt to schedule a Physical and Diabetes f/u / Therapeutic Diabetic Shoes Form completion.

## 2023-05-21 ENCOUNTER — Other Ambulatory Visit (HOSPITAL_COMMUNITY): Payer: Self-pay | Admitting: Cardiology

## 2023-05-21 ENCOUNTER — Ambulatory Visit (HOSPITAL_COMMUNITY): Payer: Self-pay | Admitting: Pharmacist

## 2023-05-21 ENCOUNTER — Other Ambulatory Visit: Payer: Self-pay | Admitting: Family

## 2023-05-21 DIAGNOSIS — G629 Polyneuropathy, unspecified: Secondary | ICD-10-CM

## 2023-05-21 DIAGNOSIS — Z95811 Presence of heart assist device: Secondary | ICD-10-CM

## 2023-05-21 DIAGNOSIS — Z7901 Long term (current) use of anticoagulants: Secondary | ICD-10-CM | POA: Diagnosis not present

## 2023-05-21 LAB — POCT INR: INR: 3.1 — AB (ref 2.0–3.0)

## 2023-05-22 DIAGNOSIS — N186 End stage renal disease: Secondary | ICD-10-CM | POA: Diagnosis not present

## 2023-05-22 DIAGNOSIS — N2581 Secondary hyperparathyroidism of renal origin: Secondary | ICD-10-CM | POA: Diagnosis not present

## 2023-05-22 DIAGNOSIS — Z992 Dependence on renal dialysis: Secondary | ICD-10-CM | POA: Diagnosis not present

## 2023-05-23 ENCOUNTER — Ambulatory Visit: Payer: Medicare HMO | Admitting: Family Medicine

## 2023-05-23 ENCOUNTER — Encounter: Payer: Self-pay | Admitting: Family Medicine

## 2023-05-23 VITALS — Temp 98.2°F | Wt 197.0 lb

## 2023-05-23 DIAGNOSIS — Z794 Long term (current) use of insulin: Secondary | ICD-10-CM | POA: Diagnosis not present

## 2023-05-23 DIAGNOSIS — Z992 Dependence on renal dialysis: Secondary | ICD-10-CM

## 2023-05-23 DIAGNOSIS — E785 Hyperlipidemia, unspecified: Secondary | ICD-10-CM

## 2023-05-23 DIAGNOSIS — I255 Ischemic cardiomyopathy: Secondary | ICD-10-CM

## 2023-05-23 DIAGNOSIS — N401 Enlarged prostate with lower urinary tract symptoms: Secondary | ICD-10-CM

## 2023-05-23 DIAGNOSIS — E1122 Type 2 diabetes mellitus with diabetic chronic kidney disease: Secondary | ICD-10-CM | POA: Diagnosis not present

## 2023-05-23 DIAGNOSIS — I10 Essential (primary) hypertension: Secondary | ICD-10-CM | POA: Diagnosis not present

## 2023-05-23 DIAGNOSIS — N138 Other obstructive and reflux uropathy: Secondary | ICD-10-CM

## 2023-05-23 DIAGNOSIS — I4819 Other persistent atrial fibrillation: Secondary | ICD-10-CM | POA: Diagnosis not present

## 2023-05-23 DIAGNOSIS — N186 End stage renal disease: Secondary | ICD-10-CM | POA: Diagnosis not present

## 2023-05-23 DIAGNOSIS — I5042 Chronic combined systolic (congestive) and diastolic (congestive) heart failure: Secondary | ICD-10-CM

## 2023-05-23 LAB — POCT GLYCOSYLATED HEMOGLOBIN (HGB A1C): Hemoglobin A1C: 6.7 % — AB (ref 4.0–5.6)

## 2023-05-23 MED ORDER — ROSUVASTATIN CALCIUM 10 MG PO TABS: 10.0000 mg | ORAL_TABLET | Freq: Every day | ORAL | 3 refills | Status: DC

## 2023-05-23 NOTE — Progress Notes (Signed)
Subjective:    Patient ID: Marcus Johnson., male    DOB: 12-11-1960, 62 y.o.   MRN: [id number]  HPI Here with his brother to follow up on issues. He seems to be doing fairly well. He gets regular dialysis. He sees Dr. Shirlee Latch regularly to manage his LVAD and BP. He still takes insulin per our usual regimen ,and his A1c today is 6.7%. He sees [facility] and they have ordered him diabetic shoes. He has a form for [place] to fill out. He sees Dr.Zamora frequently for diabetic retinopathy.    Review of Systems  Constitutional: Negative.   Respiratory: Negative.    Cardiovascular: Negative.   Gastrointestinal: Negative.   Genitourinary: Negative.   Neurological: Negative.        Objective:   Physical Exam Constitutional:      Appearance: Normal appearance.  Cardiovascular:     Rate and Rhythm: Normal rate and regular rhythm.     Pulses: Normal pulses.     Heart sounds: Normal heart sounds.  Pulmonary:     Effort: Pulmonary effort is normal.     Breath sounds: Normal breath sounds.  Musculoskeletal:     Comments: Trace edema in both ankles   Neurological:     General: No focal deficit present.     Mental Status: He is alert and oriented to person, place, and time.           Assessment & Plan:  His diabetes is well controlled. His HTN and ischemic cardiopathy and CHF are stable. His atrial fibrillation is stable and he remains on Coumadin. We filled out the forms to allow him to get diabetic shoes. We spent a total of ( 33  ) minutes reviewing records and discussing these issues. We will set him up to check a lipid panel and a PSA.  Gershon Crane, MD

## 2023-05-24 DIAGNOSIS — Z992 Dependence on renal dialysis: Secondary | ICD-10-CM | POA: Diagnosis not present

## 2023-05-24 DIAGNOSIS — N186 End stage renal disease: Secondary | ICD-10-CM | POA: Diagnosis not present

## 2023-05-24 DIAGNOSIS — N2581 Secondary hyperparathyroidism of renal origin: Secondary | ICD-10-CM | POA: Diagnosis not present

## 2023-05-27 DIAGNOSIS — N186 End stage renal disease: Secondary | ICD-10-CM | POA: Diagnosis not present

## 2023-05-27 DIAGNOSIS — Z992 Dependence on renal dialysis: Secondary | ICD-10-CM | POA: Diagnosis not present

## 2023-05-27 DIAGNOSIS — N2581 Secondary hyperparathyroidism of renal origin: Secondary | ICD-10-CM | POA: Diagnosis not present

## 2023-05-27 MED ORDER — ACCU-CHEK GUIDE VI STRP: ORAL_STRIP | 1 refills | Status: DC

## 2023-05-28 ENCOUNTER — Ambulatory Visit (HOSPITAL_COMMUNITY): Payer: Self-pay | Admitting: Pharmacist

## 2023-05-28 LAB — POCT INR: INR: 2.8 (ref 2.0–3.0)

## 2023-05-29 DIAGNOSIS — N186 End stage renal disease: Secondary | ICD-10-CM | POA: Diagnosis not present

## 2023-05-29 DIAGNOSIS — Z992 Dependence on renal dialysis: Secondary | ICD-10-CM | POA: Diagnosis not present

## 2023-05-29 DIAGNOSIS — N2581 Secondary hyperparathyroidism of renal origin: Secondary | ICD-10-CM | POA: Diagnosis not present

## 2023-05-31 DIAGNOSIS — N2581 Secondary hyperparathyroidism of renal origin: Secondary | ICD-10-CM | POA: Diagnosis not present

## 2023-05-31 DIAGNOSIS — Z992 Dependence on renal dialysis: Secondary | ICD-10-CM | POA: Diagnosis not present

## 2023-05-31 DIAGNOSIS — N186 End stage renal disease: Secondary | ICD-10-CM | POA: Diagnosis not present

## 2023-06-03 DIAGNOSIS — N2581 Secondary hyperparathyroidism of renal origin: Secondary | ICD-10-CM | POA: Diagnosis not present

## 2023-06-03 DIAGNOSIS — Z992 Dependence on renal dialysis: Secondary | ICD-10-CM | POA: Diagnosis not present

## 2023-06-03 DIAGNOSIS — N186 End stage renal disease: Secondary | ICD-10-CM | POA: Diagnosis not present
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
## 2023-06-04 ENCOUNTER — Ambulatory Visit (HOSPITAL_COMMUNITY): Payer: Self-pay | Admitting: Pharmacist

## 2023-06-04 LAB — POCT INR: INR: 2.8 (ref 2.0–3.0)

## 2023-06-05 DIAGNOSIS — N186 End stage renal disease: Secondary | ICD-10-CM | POA: Diagnosis not present

## 2023-06-05 DIAGNOSIS — N2581 Secondary hyperparathyroidism of renal origin: Secondary | ICD-10-CM | POA: Diagnosis not present

## 2023-06-05 DIAGNOSIS — Z992 Dependence on renal dialysis: Secondary | ICD-10-CM | POA: Diagnosis not present

## 2023-06-06 DIAGNOSIS — Z992 Dependence on renal dialysis: Secondary | ICD-10-CM | POA: Diagnosis not present

## 2023-06-06 DIAGNOSIS — I509 Heart failure, unspecified: Secondary | ICD-10-CM | POA: Diagnosis not present

## 2023-06-06 DIAGNOSIS — N186 End stage renal disease: Secondary | ICD-10-CM | POA: Diagnosis not present

## 2023-06-07 DIAGNOSIS — Z992 Dependence on renal dialysis: Secondary | ICD-10-CM | POA: Diagnosis not present

## 2023-06-07 DIAGNOSIS — N2581 Secondary hyperparathyroidism of renal origin: Secondary | ICD-10-CM | POA: Diagnosis not present

## 2023-06-07 DIAGNOSIS — N186 End stage renal disease: Secondary | ICD-10-CM | POA: Diagnosis not present

## 2023-06-10 DIAGNOSIS — N186 End stage renal disease: Secondary | ICD-10-CM | POA: Diagnosis not present

## 2023-06-10 DIAGNOSIS — Z992 Dependence on renal dialysis: Secondary | ICD-10-CM | POA: Diagnosis not present

## 2023-06-10 DIAGNOSIS — N2581 Secondary hyperparathyroidism of renal origin: Secondary | ICD-10-CM | POA: Diagnosis not present

## 2023-06-11 ENCOUNTER — Ambulatory Visit (HOSPITAL_COMMUNITY): Payer: Self-pay | Admitting: Pharmacist

## 2023-06-11 LAB — POCT INR: INR: 2.9 (ref 2.0–3.0)

## 2023-06-12 ENCOUNTER — Encounter: Payer: Self-pay | Admitting: Family

## 2023-06-12 ENCOUNTER — Other Ambulatory Visit: Payer: Self-pay

## 2023-06-12 ENCOUNTER — Telehealth (INDEPENDENT_AMBULATORY_CARE_PROVIDER_SITE_OTHER): Payer: Medicare HMO | Admitting: Family

## 2023-06-12 DIAGNOSIS — N2581 Secondary hyperparathyroidism of renal origin: Secondary | ICD-10-CM | POA: Diagnosis not present

## 2023-06-12 DIAGNOSIS — Z95811 Presence of heart assist device: Secondary | ICD-10-CM | POA: Diagnosis not present

## 2023-06-12 DIAGNOSIS — N186 End stage renal disease: Secondary | ICD-10-CM | POA: Diagnosis not present

## 2023-06-12 DIAGNOSIS — T827XXA Infection and inflammatory reaction due to other cardiac and vascular devices, implants and grafts, initial encounter: Secondary | ICD-10-CM

## 2023-06-12 DIAGNOSIS — Z992 Dependence on renal dialysis: Secondary | ICD-10-CM | POA: Diagnosis not present

## 2023-06-12 MED ORDER — CEPHALEXIN 500 MG PO CAPS: 500.0000 mg | ORAL_CAPSULE | Freq: Two times a day (BID) | ORAL | 5 refills | Status: DC

## 2023-06-12 NOTE — Patient Instructions (Signed)
Nice to see you.  Continue take your Keflex for suppression.  Continue exit site care/wound care per LVAD team.  Plan for follow-up in 4 months or sooner if needed.  Have a great day and stay safe!
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
## 2023-06-12 NOTE — Assessment & Plan Note (Signed)
Marcus Johnson continues to have well suppressed MSSA infection with current dose of Keflex and no adverse side effects.  Continue exit site care per LVAD team.  Continue current dose of Keflex.  Plan for follow-up in 4 months or sooner if needed.

## 2023-06-13 ENCOUNTER — Ambulatory Visit (INDEPENDENT_AMBULATORY_CARE_PROVIDER_SITE_OTHER): Payer: Medicare HMO | Admitting: Ophthalmology

## 2023-06-13 ENCOUNTER — Encounter (INDEPENDENT_AMBULATORY_CARE_PROVIDER_SITE_OTHER): Payer: Self-pay | Admitting: Ophthalmology

## 2023-06-13 DIAGNOSIS — E113512 Type 2 diabetes mellitus with proliferative diabetic retinopathy with macular edema, left eye: Secondary | ICD-10-CM

## 2023-06-13 DIAGNOSIS — H35033 Hypertensive retinopathy, bilateral: Secondary | ICD-10-CM | POA: Diagnosis not present

## 2023-06-13 DIAGNOSIS — H25813 Combined forms of age-related cataract, bilateral: Secondary | ICD-10-CM

## 2023-06-13 DIAGNOSIS — I10 Essential (primary) hypertension: Secondary | ICD-10-CM

## 2023-06-13 DIAGNOSIS — H4312 Vitreous hemorrhage, left eye: Secondary | ICD-10-CM | POA: Diagnosis not present

## 2023-06-13 DIAGNOSIS — E113513 Type 2 diabetes mellitus with proliferative diabetic retinopathy with macular edema, bilateral: Secondary | ICD-10-CM | POA: Diagnosis not present

## 2023-06-13 DIAGNOSIS — Z794 Long term (current) use of insulin: Secondary | ICD-10-CM

## 2023-06-13 MED ORDER — AFLIBERCEPT 2MG/0.05ML IZ SOLN FOR KALEIDOSCOPE
2.0000 mg | INTRAVITREAL | Status: AC | PRN
  Administered 2023-06-13: 2 mg via INTRAVITREAL

## 2023-06-14 DIAGNOSIS — N2581 Secondary hyperparathyroidism of renal origin: Secondary | ICD-10-CM | POA: Diagnosis not present

## 2023-06-14 DIAGNOSIS — N186 End stage renal disease: Secondary | ICD-10-CM | POA: Diagnosis not present

## 2023-06-14 DIAGNOSIS — Z992 Dependence on renal dialysis: Secondary | ICD-10-CM | POA: Diagnosis not present

## 2023-06-17 DIAGNOSIS — Z992 Dependence on renal dialysis: Secondary | ICD-10-CM | POA: Diagnosis not present

## 2023-06-17 DIAGNOSIS — N186 End stage renal disease: Secondary | ICD-10-CM | POA: Diagnosis not present

## 2023-06-17 DIAGNOSIS — N2581 Secondary hyperparathyroidism of renal origin: Secondary | ICD-10-CM | POA: Diagnosis not present

## 2023-06-18 ENCOUNTER — Ambulatory Visit (HOSPITAL_COMMUNITY): Payer: Self-pay | Admitting: Pharmacist

## 2023-06-18 DIAGNOSIS — Z7901 Long term (current) use of anticoagulants: Secondary | ICD-10-CM | POA: Diagnosis not present

## 2023-06-18 LAB — POCT INR: INR: 2.4 (ref 2.0–3.0)

## 2023-06-19 DIAGNOSIS — Z992 Dependence on renal dialysis: Secondary | ICD-10-CM | POA: Diagnosis not present

## 2023-06-19 DIAGNOSIS — N186 End stage renal disease: Secondary | ICD-10-CM | POA: Diagnosis not present

## 2023-06-19 DIAGNOSIS — N2581 Secondary hyperparathyroidism of renal origin: Secondary | ICD-10-CM | POA: Diagnosis not present

## 2023-06-20 ENCOUNTER — Telehealth: Payer: Self-pay | Admitting: Podiatry

## 2023-06-20 ENCOUNTER — Ambulatory Visit (HOSPITAL_COMMUNITY)
Admission: RE | Admit: 2023-06-20 | Discharge: 2023-06-20 | Disposition: A | Discharge status: Home / Self Care | Payer: Medicare HMO | Source: Ambulatory Visit | Attending: Cardiology | Admitting: Cardiology

## 2023-06-20 VITALS — BP 120/0 | HR 78 | Ht 70.0 in | Wt 197.0 lb

## 2023-06-20 DIAGNOSIS — N186 End stage renal disease: Secondary | ICD-10-CM | POA: Insufficient documentation

## 2023-06-20 DIAGNOSIS — I4891 Unspecified atrial fibrillation: Secondary | ICD-10-CM | POA: Insufficient documentation

## 2023-06-20 DIAGNOSIS — D631 Anemia in chronic kidney disease: Secondary | ICD-10-CM | POA: Insufficient documentation

## 2023-06-20 DIAGNOSIS — Z992 Dependence on renal dialysis: Secondary | ICD-10-CM | POA: Diagnosis not present

## 2023-06-20 DIAGNOSIS — I4892 Unspecified atrial flutter: Secondary | ICD-10-CM | POA: Insufficient documentation

## 2023-06-20 DIAGNOSIS — Z95811 Presence of heart assist device: Secondary | ICD-10-CM | POA: Insufficient documentation

## 2023-06-20 DIAGNOSIS — I5022 Chronic systolic (congestive) heart failure: Secondary | ICD-10-CM | POA: Diagnosis not present

## 2023-06-20 DIAGNOSIS — E1122 Type 2 diabetes mellitus with diabetic chronic kidney disease: Secondary | ICD-10-CM | POA: Insufficient documentation

## 2023-06-20 DIAGNOSIS — I472 Ventricular tachycardia, unspecified: Secondary | ICD-10-CM | POA: Diagnosis not present

## 2023-06-20 DIAGNOSIS — G540 Brachial plexus disorders: Secondary | ICD-10-CM | POA: Insufficient documentation

## 2023-06-20 DIAGNOSIS — Z7901 Long term (current) use of anticoagulants: Secondary | ICD-10-CM | POA: Diagnosis not present

## 2023-06-20 DIAGNOSIS — Z951 Presence of aortocoronary bypass graft: Secondary | ICD-10-CM | POA: Diagnosis not present

## 2023-06-20 DIAGNOSIS — T827XXD Infection and inflammatory reaction due to other cardiac and vascular devices, implants and grafts, subsequent encounter: Secondary | ICD-10-CM | POA: Diagnosis not present

## 2023-06-20 DIAGNOSIS — I132 Hypertensive heart and chronic kidney disease with heart failure and with stage 5 chronic kidney disease, or end stage renal disease: Secondary | ICD-10-CM | POA: Insufficient documentation

## 2023-06-20 DIAGNOSIS — B9561 Methicillin susceptible Staphylococcus aureus infection as the cause of diseases classified elsewhere: Secondary | ICD-10-CM | POA: Insufficient documentation

## 2023-06-20 DIAGNOSIS — Z4509 Encounter for adjustment and management of other cardiac device: Secondary | ICD-10-CM | POA: Diagnosis present

## 2023-06-20 DIAGNOSIS — I2510 Atherosclerotic heart disease of native coronary artery without angina pectoris: Secondary | ICD-10-CM | POA: Insufficient documentation

## 2023-06-20 NOTE — Telephone Encounter (Signed)
Pt called checking on status of diabetic shoes. Checked safestep and they have not received the needed documents.  Pt stated hhis provider told him they were sent over. I apologized but they did not get them. I refaxed them to the doctor with note.

## 2023-06-20 NOTE — Patient Instructions (Signed)
No change in medications Coumadin dosing per Posey Boyer D Return to [facility] in 2 months for follow up with Dr.McLean w/CMET and thyroid panel

## 2023-06-20 NOTE — Progress Notes (Addendum)
Patient presents for 2 mo follow up. Reports no problems with VAD equipment or concerns with drive line.  Patient arrived via wheelchair today but was able to step on the scale for his weight. Pt denies falls, heart failure symptoms, or signs of bleeding. Complains of fatigue on dialysis days and poor appetite the day following along with nausea. Pt was instructed that he should take the prescribed Zofran to help with this.   Pt has not had anymore ICD shocks since his last visit and is taking his amiodarone 200 daily and Mexiletine 150 mg bid.  Patient reports he continues M/W/F dialysis. Pt confirms he takes Midodrine TID on HD days only. He tells me that he feels lightheaded on non HD days at times and if this happens he takes a Midodrine.  Pt on Keflex 500 mg BID (dosed for HD schedule). [name] reports they are changing dressing weekly. Currently wearing 2 anchors. Reports he wears binder at home, but does not like to wear during dialysis.   Dry weight at HD: 87.5 kg. Pt states they typically pull 4 L at each treatment.    Vital Signs:  Doppler Pressure: 120 Automatic BP: 120/101 (110) HR: 78 NSR O2: 100% RA   Weight: 197 lbs w/ eqt Last wt: 202.8 w/ eqt   VAD Indication: Destination Therapy due to CKD Stage 3b   LVAD assessment:  HM III: Speed: 5700 rpms Flow: 5.3 Power: 4.6 w    PI: 3.5 Alarms: none Events: 10-20  Fixed speed:  5700 Low speed limit:  5400  Primary controller: Back up battery due for replacement in 24 months Secondary controller: Back up battery due for replacement in 30 months  I reviewed the LVAD parameters from today and compared the results to the patient's prior recorded data. LVAD interrogation was NEGATIVE for significant power changes, NEGATIVE for clinical alarms and STABLE for PI events/speed drops. No programming changes were made and pump is functioning within specified parameters. Pt is performing daily controller and system monitor self tests  along with completing weekly and monthly maintenance for LVAD equipment.   LVAD equipment check completed and is in good working order. Back-up equipment present.    Annual Equipment Maintenance on UBC/PM was performed 10/23/22.  Exit Site Care: Dressing maintained weekly by pt's brother. Dressing is clean, dry, and intact. Anchors correctly applied. Pt denies fever or chills. May continue weekly dressing using daily kits. Pt given tape today and 16 anchors.   Device: Medtronic single Therapies: VF: 240 VT: 200 Slow VT: 143 Pacing: VVI 40 Last check: 05/02/23   BP & Labs:  Doppler 120 - Doppler is reflecting modified systolic  Hgb 10.6- No S/S of bleeding. Specifically denies melena/BRBPR or nosebleeds.   LDH 265- established baseline of 160 - 180. Denies tea-colored urine. No power elevations noted on interrogation.   Plan: Stop Atorvastatin. Coumadin dosing per Posey Boyer D Return to [facility] in 2 months for follow up with Dr.Ahyana Skillin w/CMET and thyroid panel and lipid panel. Please draw blood at this appt.  Carlton Adam RN,BSN VAD Coordinator  Office: [phone]  24/7 Pager: [phone]   Follow up for Heart Failure/LVAD:  Marcus Johnson. is a 62 y.o. male who has a h/o DM2, CAD s/p CABG 2002, systolic HF due to ischemic cardiomyopathy with EF 20-25% (echo 12/15), DM2 and CKD. He is s/p Medtronic single chamber ICD.   Admitted in 12/15 due to ADHF. Required short course milrinone for diuresis. Diuresed 30 pounds.  CPX 2/16 showed severely reduced functional capacity.  There was a significant disconnect between his symptoms and the CPX.  RHC in 6/16 showed fairly normal filling pressures and low but not markedly low cardiac output.    CPX (4/19) was submaximal but suggested severe limitation from HF.    He was admitted in 6/19 with marked volume overload and dyspnea.  Echo in 6/19 showed EF 20% with severe global hypokinesis.  LHC/RHC showed severe native  vessel CAD with patent LIMA-D, SVG-OM, and SVG-PLV; cardiac index low.  He was started on milrinone and diuresed.  We discussed LVAD extensively in light of low output, creatinine that is trending up and concern for decompensation of RV over time. He was adamant that he did not want to pursue LVAD workup yet and did not want a referral for transplant evaluation. Milrinone was weaned off and he was discharged home.    Admitted 08/01/18-08/07/18 with volume overload and left foot wound. Diuresed 38 lbs with IV lasix and then transitioned back to torsemide 100 mg BID. ID was consulted with concerns for left foot osteomyelitis noted on CT scan. He got IV ancef q8 hours x 42 days. [facility] provided teaching and supplies for home antibiotics. Course complicated by AKI. DC weight 200 lbs.    He went into atrial flutter and had DCCV to NSR in 1/20.    Echo in 4/21 showed EF 25% with apical akinesis and diffuse hypokinesis relatively preserved in lateral wall, mildly decreased RV systolic function, PASP 41 mmHg. CPX in 5/21 showed severe functional limitation suggestive of advanced HF. 5/21 Zio patch showed short atrial flutter runs, 3.8% PVCs.    He developed COVID-19 infection in 1/22.  Symptoms were predominantly GI.    He developed progressive worsening of CHF in early 2022 and was admitted in 3/22 with low output HF.  Impella 5.5 was placed as bridge to Heartmate III LVAD placed in 10/13/20.  He developed post-op renal failure and ended up on dialysis.  He was markedly deconditioned post-op and went to CIR.    Still has pain and minimal function in his right hand.  Nerve conduction study showed a chronic sensorimotor neuropathy in the right arm.    Ramp echo in 3/23 showed severe LV dysfunction with mild RV enlargement and mild dysfunction, mild-moderate AI.  Speed was increased to 5800 rpm.  The aortic valve still opened with every beat but only slightly. Flow increased from 4.1 to 5.3 L/min and PI remained  stable.   Patient was admitted in 4/23 with falls and lightheadedness. He fractured his left foot when falling and initially was unable to walk.  He was thought to be hypotensive due to over-diuresis and dry weight was increased.  Midodrine on HD days was also increased.  He was treated for a driveline infection with vancomycin then doxycycline, now off.   ICD shock for VT in 7/23, not started on amiodarone given only 1 episode.   Echo in 3/24 showed EF 20-25% with midline IV septum, mild RV dysfunction with severe RVE, moderate AI.   In 5/24, patient had VT treated with ATP.      Patient was shocked by his ICD for VT on 04/17/23, 04/20/23, and 04/21/23.  I had him increase his amiodarone to 200 mg bid x 2 wks then back to 200 mg daily.  After discussion with Dr. Graciela Husbands, I started him on mexiletine as well.    Ramp echo was in 9/24.  Starting speed 5800 rpm  with moderate AI, slight aortic valve opening each beat, moderate RV enlargement/moderate RV dysfunction, septum shifted slightly to the left.  Speed decreased to 5700 rpm with moderate AI, aortic valve opening each beat, midline septum with flow fairly stable at 5 L/min.  I left speed at 5700 rpm.   Patient returns for followup of LVAD.  He continues to have nausea and fatigue after HD. Has not been using Zofran.  No dyspnea with ADLs, able to walk into the office with no problems.  No further VT.  No lightheadedness.   MAP and weight are stable.   Labs (5/22): LDH 174, hgb 10.6 Labs (7/22): LDH 164 Labs (8/22): hgb 11.4 Labs (10/22): hgb 12 Labs (11/22): hgb 12.2 Labs (4/23): LDL 163, hgb 10.4 Labs (6/23): hgb 12.2, LDH 284 Labs (7/23): LFTs normal, hgb 11.3, LDH 235 Labs (9/23): hgb 10.2 Labs (1/24): hgb 10.6, LDH 279 Labs (3/24): LDH 222, hgb 11.2 Labs (6/24): LDH 219, hgb 11.3, TSH normal Labs (9/24): TSH normal, hgb 12, LFTs normal, LDH 215  Past Medical History:  Diagnosis Date   AICD (automatic cardioverter/defibrillator)  present    Single-chamber  implantable cardiac defibrillator - Medtronic   Atrial fibrillation (HCC)    Cataract    Mixed form OU   CHF (congestive heart failure) (HCC)    Chronic kidney disease    Chronic kidney disease (CKD), stage III (moderate) (HCC)    Chronic systolic heart failure (HCC)    a. Echo 5/13: Mild LVE, mild LVH, EF 10%, anteroseptal, lateral, apical AK, mild MR, mild LAE, moderately reduced RVSF, mild RAE, PASP 60;  b. 07/2014 Echo: EF 20-2%, diff HK, AKI of antsep/apical/mid-apicalinferior, mod reduced RV.   Coronary artery disease    a. s/p CABG 2002 b. LHC 5/13:  dLM 80%, LAD subtotally occluded, pCFX occluded, pRCA 50%, mid? Occlusion with high take off of the PDA with 70% multiple lesions-not bypassed and supplies collaterals to LAD, LIMA-IM/ramus ok, S-OM ok, S-PLA branches ok. Medical therapy was recommended   COVID    Diabetic retinopathy (HCC)    NPDR OD, PDR OS   Dyspnea    Gout    "on daily RX" (01/08/2018)   Hypertension    Hypertensive retinopathy    OU   Ischemic cardiomyopathy    a. Echo 5/13: Mild LVE, mild LVH, EF 10%, anteroseptal, lateral, apical AK, mild MR, mild LAE, moderately reduced RVSF, mild RAE, PASP 60;  b. 01/2012 s/p MDT [id number] Protecta XT VR AICD;  c. 07/2014 Echo: EF 20-2%, diff HK, AKI of antsep/apical/mid-apicalinferior, mod reduced RV.   MRSA (methicillin resistant Staphylococcus aureus)    Status post right foot plantar deep infection with MRSA status post  I&D 10/2008   Myocardial infarction [facility])    "was told I'd had several before heart OR in 2002" (01/08/2018)   Noncompliance    Peripheral neuropathy    Retinopathy, diabetic, background (HCC)    Syncope    Type II diabetes mellitus (HCC)    requiring insulin    Vitreous hemorrhage, left (HCC)    and proliferative diabetic retinopathy    Current Outpatient Medications  Medication Sig Dispense Refill   amiodarone (PACERONE) 200 MG tablet Take 1 tablet (200 mg total) by mouth 2  (two) times daily. Take 1 tablet (200 mg) by mouth twice daily for 2 weeks starting 04/23/23, then decrease to 1 tablet (200 mg) daily 05/07/23. (Patient taking differently: Take 200 mg by mouth daily.) 90 tablet 3  BD PEN NEEDLE NANO 2ND GEN 32G X 4 MM MISC USE TO INJECT INSULIN DAILY 100 each 0   Blood Glucose Monitoring Suppl (ONE TOUCH ULTRA 2) w/Device KIT 1 Device by Does not apply route 3 (three) times daily. 1 kit 0   cephALEXin (KEFLEX) 500 MG capsule Take 1 capsule (500 mg total) by mouth 2 (two) times daily. Take evening dose after dialysis on HD days. 60 capsule 5   gabapentin (NEURONTIN) 100 MG capsule TAKE 1 CAPSULE ( 100 MG) IN THE MORNING AND 2 CAPSULES ( 200 MG) AT BEDTIME 90 capsule 3   glucose blood (ACCU-CHEK GUIDE) test strip USE AS INSTRUCTED 3 TIMES DAILY 100 strip 1   insulin glargine (LANTUS SOLOSTAR) 100 UNIT/ML Solostar Pen Inject 60 Units into the skin at bedtime. (Patient taking differently: Inject 60 Units into the skin daily as needed.) 30 mL 11   mexiletine (MEXITIL) 150 MG capsule Take 1 capsule (150 mg total) by mouth 2 (two) times daily. 180 capsule 3   midodrine (PROAMATINE) 10 MG tablet Take 2 tablets (20 mg total) by mouth 3 (three) times daily. Three times a day on dialysis days; NO midodrine on NON dialysis days 160 tablet 11   multivitamin (RENA-VIT) TABS tablet TAKE 1 TABLET BY MOUTH EVERYDAY AT BEDTIME 90 tablet 3   OneTouch UltraSoft 2 Lancets MISC 1 each by Does not apply route 3 (three) times daily before meals. 100 each 1   pantoprazole (PROTONIX) 40 MG tablet TAKE 1 TABLET BY MOUTH EVERY DAY 90 tablet 3   rosuvastatin (CRESTOR) 10 MG tablet Take 1 tablet (10 mg total) by mouth daily. 90 tablet 3   sevelamer (RENAGEL) 800 MG tablet Take 800 mg by mouth 3 (three) times daily.     warfarin (COUMADIN) 3 MG tablet TAKE 4.5 MG (1.5 TABS) DAILY OR AS DIRECTED BY HF CLINIC 150 tablet 3   acetaminophen (TYLENOL) 325 MG tablet Take by mouth. (Patient not taking:  Reported on 06/20/2023)     allopurinol (ZYLOPRIM) 300 MG tablet Take by mouth. (Patient not taking: Reported on 06/20/2023)     naphazoline-glycerin (CLEAR EYES REDNESS) 0.012-0.25 % SOLN Place 1-2 drops into the right eye 4 (four) times daily as needed for eye irritation. (Patient not taking: Reported on 06/20/2023)     ondansetron (ZOFRAN) 4 MG tablet Take 1 tablet (4 mg total) by mouth every 8 (eight) hours as needed for nausea or vomiting. (Patient not taking: Reported on 06/20/2023) 30 tablet 3   polyethylene glycol powder (GLYCOLAX/MIRALAX) 17 GM/SCOOP powder  (Patient not taking: Reported on 06/20/2023)     traMADol (ULTRAM) 50 MG tablet Take 1 tablet (50 mg total) by mouth every 12 (twelve) hours as needed. (Patient not taking: Reported on 06/20/2023) 30 tablet 0   No current facility-administered medications for this encounter.    Meclizine hcl and Ivabradine  REVIEW OF SYSTEMS: All systems negative except as listed in HPI, PMH and Problem list.   LVAD INTERROGATION:  Please see LVAD nurse's note above.   Physical Exam: General: Well appearing this am. NAD.  HEENT: Normal. Neck: Supple, JVP 7-8 cm. Carotids OK.  Cardiac:  Mechanical heart sounds with LVAD hum present.  Lungs:  CTAB, normal effort.  Abdomen:  NT, ND, no HSM. No bruits or masses. +BS  LVAD exit site: Well-healed and incorporated. Dressing dry and intact. No erythema or drainage. Stabilization device present and accurately applied. Driveline dressing changed daily per sterile technique. Extremities:  Warm and  dry. No cyanosis, clubbing, rash, or edema.  Neuro:  Alert & oriented x 3. Cranial nerves grossly intact. Moves all 4 extremities w/o difficulty. Affect pleasant    ASSESSMENT AND PLAN:  1. Chronic systolic CHF:  Ischemic cardiomyopathy. Has Medtronic ICD. Low output HF, admitted and Impella 5.5 placed 10/04/20.  HM3 LVAD was placed on 10/13/20. Developed post-op renal failure requiring eventual hemodialysis.   Ramp echo in 3/23, speed increased to 5800 rpm under echo guidance with increase in flow and stable PI.  Echo in 3/24 showed EF 20-25% with midline IV septum, mild RV dysfunction with severe RVE, moderate AI. Ramp echo in 9/24 with decrease in speed in setting of VT episodes.  MAP controlled today. No further VT.  He does not look volume overloaded on exam.  LVAD interrogation shows stable PI events with HD, no low flows/suction.  - Continue midodrine on HD days and reminded him not to take midodrine on the non-HD days.    2. LVAD:  VAD interrogated personally.  Parameters stable with PI events on HD days. No low flow events or suction.  - He is now off ASA.  - Continue warfarin with INR goal 2-2.5.   3. CAD s/p CABG 2002:  Last cath in 6/19 with patent grafts. No chest pain.  - Continue Crestor (atorvastatin is on his list also, needs to make sure that he is not taking both of these meds).  4. ESRD: Currently dialyzing via tunneled catheter. No low flow events. He has had some nausea after dialysis.  - Continue HD M-W-F  - With continuous flow LVAD, think AV fistula would be unlikely to mature.   - He has Zofran to use prn.  5. Atrial flutter/fibrillation: s/p DC-CV 08/21/18.  Has occasional episodes of AF but not prolonged.  - Continue amiodarone, recent LFTs and TSH normal.  He will need regular eye exam.  6. 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 This has been improving though not completely normal.   7. DM: Per primary care.  8. Anemia: CBC today, no evidence for overt bleeding.  9. Suspected Gastroparesis: He is no longer taking Reglan.  10. VT: Episode in 7/23 with successful ICD discharge.  No definite trigger, he did not actually feel the VT. VT again in 5/24 treated with ATP, amiodarone started.  Episodes of VT 9/11, 9/14, and 04/21/23.  Ramp echo in 9/24 with decreased speed.  No further VT.  - Continue amiodarone  200 mg daily.  - Continue mexiletine 150 mg bid.  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 Driveline site looks good today.   Followup 2 months.   Marca Ancona 06/20/2023

## 2023-06-21 DIAGNOSIS — N2581 Secondary hyperparathyroidism of renal origin: Secondary | ICD-10-CM | POA: Diagnosis not present

## 2023-06-21 DIAGNOSIS — N186 End stage renal disease: Secondary | ICD-10-CM | POA: Diagnosis not present

## 2023-06-21 DIAGNOSIS — Z992 Dependence on renal dialysis: Secondary | ICD-10-CM | POA: Diagnosis not present

## 2023-06-24 DIAGNOSIS — Z992 Dependence on renal dialysis: Secondary | ICD-10-CM | POA: Diagnosis not present

## 2023-06-24 DIAGNOSIS — N2581 Secondary hyperparathyroidism of renal origin: Secondary | ICD-10-CM | POA: Diagnosis not present

## 2023-06-24 DIAGNOSIS — N186 End stage renal disease: Secondary | ICD-10-CM | POA: Diagnosis not present

## 2023-06-25 ENCOUNTER — Ambulatory Visit (HOSPITAL_COMMUNITY): Payer: Self-pay | Admitting: Pharmacist

## 2023-06-25 LAB — POCT INR: INR: 2.9 (ref 2.0–3.0)

## 2023-06-26 DIAGNOSIS — N2581 Secondary hyperparathyroidism of renal origin: Secondary | ICD-10-CM | POA: Diagnosis not present

## 2023-06-26 DIAGNOSIS — Z992 Dependence on renal dialysis: Secondary | ICD-10-CM | POA: Diagnosis not present

## 2023-06-26 DIAGNOSIS — N186 End stage renal disease: Secondary | ICD-10-CM | POA: Diagnosis not present

## 2023-06-28 DIAGNOSIS — Z992 Dependence on renal dialysis: Secondary | ICD-10-CM | POA: Diagnosis not present

## 2023-06-28 DIAGNOSIS — N2581 Secondary hyperparathyroidism of renal origin: Secondary | ICD-10-CM | POA: Diagnosis not present

## 2023-06-28 DIAGNOSIS — N186 End stage renal disease: Secondary | ICD-10-CM | POA: Diagnosis not present

## 2023-06-30 DIAGNOSIS — N2581 Secondary hyperparathyroidism of renal origin: Secondary | ICD-10-CM | POA: Diagnosis not present

## 2023-06-30 DIAGNOSIS — Z992 Dependence on renal dialysis: Secondary | ICD-10-CM | POA: Diagnosis not present

## 2023-06-30 DIAGNOSIS — N186 End stage renal disease: Secondary | ICD-10-CM | POA: Diagnosis not present

## 2023-07-01 ENCOUNTER — Ambulatory Visit (HOSPITAL_COMMUNITY): Payer: Self-pay | Admitting: Pharmacist

## 2023-07-01 LAB — POCT INR: INR: 2.3 (ref 2.0–3.0)

## 2023-07-02 DIAGNOSIS — Z992 Dependence on renal dialysis: Secondary | ICD-10-CM | POA: Diagnosis not present

## 2023-07-02 DIAGNOSIS — N186 End stage renal disease: Secondary | ICD-10-CM | POA: Diagnosis not present

## 2023-07-02 DIAGNOSIS — N2581 Secondary hyperparathyroidism of renal origin: Secondary | ICD-10-CM | POA: Diagnosis not present

## 2023-07-05 DIAGNOSIS — N2581 Secondary hyperparathyroidism of renal origin: Secondary | ICD-10-CM | POA: Diagnosis not present

## 2023-07-05 DIAGNOSIS — Z992 Dependence on renal dialysis: Secondary | ICD-10-CM | POA: Diagnosis not present

## 2023-07-05 DIAGNOSIS — N186 End stage renal disease: Secondary | ICD-10-CM | POA: Diagnosis not present

## 2023-07-06 DIAGNOSIS — N186 End stage renal disease: Secondary | ICD-10-CM | POA: Diagnosis not present

## 2023-07-06 DIAGNOSIS — I509 Heart failure, unspecified: Secondary | ICD-10-CM | POA: Diagnosis not present

## 2023-07-06 DIAGNOSIS — Z992 Dependence on renal dialysis: Secondary | ICD-10-CM | POA: Diagnosis not present

## 2023-07-08 DIAGNOSIS — N186 End stage renal disease: Secondary | ICD-10-CM | POA: Diagnosis not present

## 2023-07-08 DIAGNOSIS — Z992 Dependence on renal dialysis: Secondary | ICD-10-CM | POA: Diagnosis not present

## 2023-07-08 DIAGNOSIS — N2581 Secondary hyperparathyroidism of renal origin: Secondary | ICD-10-CM | POA: Diagnosis not present

## 2023-07-09 ENCOUNTER — Ambulatory Visit (INDEPENDENT_AMBULATORY_CARE_PROVIDER_SITE_OTHER): Payer: Medicare HMO

## 2023-07-09 ENCOUNTER — Ambulatory Visit (HOSPITAL_COMMUNITY): Payer: Self-pay | Admitting: Pharmacist

## 2023-07-09 DIAGNOSIS — H259 Unspecified age-related cataract: Secondary | ICD-10-CM | POA: Diagnosis not present

## 2023-07-09 DIAGNOSIS — H50112 Monocular exotropia, left eye: Secondary | ICD-10-CM | POA: Diagnosis not present

## 2023-07-09 DIAGNOSIS — E113513 Type 2 diabetes mellitus with proliferative diabetic retinopathy with macular edema, bilateral: Secondary | ICD-10-CM | POA: Diagnosis not present

## 2023-07-09 DIAGNOSIS — I255 Ischemic cardiomyopathy: Secondary | ICD-10-CM

## 2023-07-09 DIAGNOSIS — H547 Unspecified visual loss: Secondary | ICD-10-CM | POA: Diagnosis not present

## 2023-07-09 DIAGNOSIS — H25813 Combined forms of age-related cataract, bilateral: Secondary | ICD-10-CM | POA: Diagnosis not present

## 2023-07-09 DIAGNOSIS — H4312 Vitreous hemorrhage, left eye: Secondary | ICD-10-CM | POA: Diagnosis not present

## 2023-07-09 DIAGNOSIS — H35033 Hypertensive retinopathy, bilateral: Secondary | ICD-10-CM | POA: Diagnosis not present

## 2023-07-09 LAB — POCT INR: INR: 3 (ref 2.0–3.0)

## 2023-07-10 DIAGNOSIS — Z992 Dependence on renal dialysis: Secondary | ICD-10-CM | POA: Diagnosis not present

## 2023-07-10 DIAGNOSIS — N2581 Secondary hyperparathyroidism of renal origin: Secondary | ICD-10-CM | POA: Diagnosis not present

## 2023-07-10 DIAGNOSIS — N186 End stage renal disease: Secondary | ICD-10-CM | POA: Diagnosis not present

## 2023-07-10 LAB — CUP PACEART REMOTE DEVICE CHECK
Battery Remaining Longevity: 109 mo
Battery Voltage: 3 V
Brady Statistic RV Percent Paced: 0.01 %
Date Time Interrogation Session: 20241203115947
HighPow Impedance: 35 Ohm
HighPow Impedance: 43 Ohm
Implantable Lead Connection Status: 753985
Implantable Lead Implant Date: 20130603
Implantable Lead Location: 753860
Implantable Lead Model: 6947
Implantable Pulse Generator Implant Date: 20211129
Lead Channel Impedance Value: 418 Ohm
Lead Channel Impedance Value: 665 Ohm
Lead Channel Pacing Threshold Amplitude: 0.75 V
Lead Channel Pacing Threshold Pulse Width: 0.4 ms
Lead Channel Sensing Intrinsic Amplitude: 5.5 mV
Lead Channel Sensing Intrinsic Amplitude: 5.5 mV
Lead Channel Setting Pacing Amplitude: 2 V
Lead Channel Setting Pacing Pulse Width: 0.4 ms
Lead Channel Setting Sensing Sensitivity: 0.3 mV
Zone Setting Status: 755011
Zone Setting Status: 755011

## 2023-07-11 ENCOUNTER — Ambulatory Visit: Payer: Medicare HMO | Admitting: Podiatry

## 2023-07-12 DIAGNOSIS — Z992 Dependence on renal dialysis: Secondary | ICD-10-CM | POA: Diagnosis not present

## 2023-07-12 DIAGNOSIS — N186 End stage renal disease: Secondary | ICD-10-CM | POA: Diagnosis not present

## 2023-07-12 DIAGNOSIS — N2581 Secondary hyperparathyroidism of renal origin: Secondary | ICD-10-CM | POA: Diagnosis not present

## 2023-07-15 DIAGNOSIS — N186 End stage renal disease: Secondary | ICD-10-CM | POA: Diagnosis not present

## 2023-07-15 DIAGNOSIS — N2581 Secondary hyperparathyroidism of renal origin: Secondary | ICD-10-CM | POA: Diagnosis not present

## 2023-07-15 DIAGNOSIS — Z992 Dependence on renal dialysis: Secondary | ICD-10-CM | POA: Diagnosis not present

## 2023-07-16 ENCOUNTER — Ambulatory Visit (HOSPITAL_COMMUNITY): Payer: Self-pay | Admitting: Pharmacist

## 2023-07-16 DIAGNOSIS — Z7901 Long term (current) use of anticoagulants: Secondary | ICD-10-CM | POA: Diagnosis not present

## 2023-07-16 LAB — POCT INR: INR: 2.7 (ref 2.0–3.0)
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
## 2023-07-17 DIAGNOSIS — N2581 Secondary hyperparathyroidism of renal origin: Secondary | ICD-10-CM | POA: Diagnosis not present

## 2023-07-17 DIAGNOSIS — N186 End stage renal disease: Secondary | ICD-10-CM | POA: Diagnosis not present

## 2023-07-17 DIAGNOSIS — Z992 Dependence on renal dialysis: Secondary | ICD-10-CM | POA: Diagnosis not present

## 2023-07-18 ENCOUNTER — Other Ambulatory Visit (INDEPENDENT_AMBULATORY_CARE_PROVIDER_SITE_OTHER): Payer: Self-pay | Admitting: Ophthalmology

## 2023-07-18 ENCOUNTER — Encounter (INDEPENDENT_AMBULATORY_CARE_PROVIDER_SITE_OTHER): Payer: Self-pay | Admitting: Ophthalmology

## 2023-07-18 ENCOUNTER — Ambulatory Visit (INDEPENDENT_AMBULATORY_CARE_PROVIDER_SITE_OTHER): Payer: Medicare HMO | Admitting: Ophthalmology

## 2023-07-18 DIAGNOSIS — H35033 Hypertensive retinopathy, bilateral: Secondary | ICD-10-CM | POA: Diagnosis not present

## 2023-07-18 DIAGNOSIS — H25813 Combined forms of age-related cataract, bilateral: Secondary | ICD-10-CM

## 2023-07-18 DIAGNOSIS — E113513 Type 2 diabetes mellitus with proliferative diabetic retinopathy with macular edema, bilateral: Secondary | ICD-10-CM | POA: Diagnosis not present

## 2023-07-18 DIAGNOSIS — Z794 Long term (current) use of insulin: Secondary | ICD-10-CM | POA: Diagnosis not present

## 2023-07-18 DIAGNOSIS — H4312 Vitreous hemorrhage, left eye: Secondary | ICD-10-CM | POA: Diagnosis not present

## 2023-07-18 DIAGNOSIS — I10 Essential (primary) hypertension: Secondary | ICD-10-CM

## 2023-07-18 MED ORDER — AFLIBERCEPT 2MG/0.05ML IZ SOLN FOR KALEIDOSCOPE
2.0000 mg | INTRAVITREAL | Status: AC | PRN
  Administered 2023-07-18: 2 mg via INTRAVITREAL

## 2023-07-19 ENCOUNTER — Other Ambulatory Visit (HOSPITAL_COMMUNITY): Payer: Self-pay | Admitting: Adult Health

## 2023-07-19 DIAGNOSIS — N2581 Secondary hyperparathyroidism of renal origin: Secondary | ICD-10-CM | POA: Diagnosis not present

## 2023-07-19 DIAGNOSIS — Z992 Dependence on renal dialysis: Secondary | ICD-10-CM | POA: Diagnosis not present

## 2023-07-19 DIAGNOSIS — N186 End stage renal disease: Secondary | ICD-10-CM | POA: Diagnosis not present

## 2023-07-22 DIAGNOSIS — Z992 Dependence on renal dialysis: Secondary | ICD-10-CM | POA: Diagnosis not present

## 2023-07-22 DIAGNOSIS — N186 End stage renal disease: Secondary | ICD-10-CM | POA: Diagnosis not present

## 2023-07-22 DIAGNOSIS — N2581 Secondary hyperparathyroidism of renal origin: Secondary | ICD-10-CM | POA: Diagnosis not present

## 2023-07-23 ENCOUNTER — Ambulatory Visit (HOSPITAL_COMMUNITY): Payer: Self-pay | Admitting: Pharmacist

## 2023-07-23 LAB — POCT INR: INR: 3.1 — AB (ref 2.0–3.0)

## 2023-07-24 ENCOUNTER — Other Ambulatory Visit (HOSPITAL_COMMUNITY): Payer: Self-pay | Admitting: Pharmacist

## 2023-07-24 ENCOUNTER — Other Ambulatory Visit (HOSPITAL_COMMUNITY): Payer: Self-pay | Admitting: *Deleted

## 2023-07-24 DIAGNOSIS — N186 End stage renal disease: Secondary | ICD-10-CM | POA: Diagnosis not present

## 2023-07-24 DIAGNOSIS — N2581 Secondary hyperparathyroidism of renal origin: Secondary | ICD-10-CM | POA: Diagnosis not present

## 2023-07-24 DIAGNOSIS — Z992 Dependence on renal dialysis: Secondary | ICD-10-CM | POA: Diagnosis not present

## 2023-07-25 ENCOUNTER — Encounter: Payer: Self-pay | Admitting: Podiatry

## 2023-07-25 ENCOUNTER — Ambulatory Visit (INDEPENDENT_AMBULATORY_CARE_PROVIDER_SITE_OTHER): Payer: Medicare HMO | Admitting: Podiatry

## 2023-07-25 ENCOUNTER — Telehealth: Payer: Self-pay

## 2023-07-25 DIAGNOSIS — Z794 Long term (current) use of insulin: Secondary | ICD-10-CM

## 2023-07-25 DIAGNOSIS — N186 End stage renal disease: Secondary | ICD-10-CM | POA: Diagnosis not present

## 2023-07-25 DIAGNOSIS — M79675 Pain in left toe(s): Secondary | ICD-10-CM

## 2023-07-25 DIAGNOSIS — E1122 Type 2 diabetes mellitus with diabetic chronic kidney disease: Secondary | ICD-10-CM

## 2023-07-25 DIAGNOSIS — B351 Tinea unguium: Secondary | ICD-10-CM

## 2023-07-25 DIAGNOSIS — Z992 Dependence on renal dialysis: Secondary | ICD-10-CM

## 2023-07-25 DIAGNOSIS — M79674 Pain in right toe(s): Secondary | ICD-10-CM | POA: Diagnosis not present

## 2023-07-25 NOTE — Progress Notes (Signed)
This patient presents  to my office for at risk foot care.  This patient requires this care by a professional since this patient will be at risk due to having  CKD, coagulation defect and DM.  This patient is unable to cut nails himself since the patient cannot reach his nails.These nails are painful walking and wearing shoes. Marland Kitchen  He presents to the office with his brother.  This patient presents for at risk foot care today.  General Appearance  Alert, conversant and in no acute stress.  Vascular  Dorsalis pedis and posterior tibial  pulses are absent  bilaterally.  Capillary return is within normal limits  bilaterally. Temperature is within normal limits  bilaterally.  Neurologic  Senn-Weinstein monofilament wire test absent  bilaterally. Muscle power within normal limits bilaterally.  Nails Thick disfigured discolored nails with subungual debris  from hallux to fifth toes bilaterally. No evidence of bacterial infection or drainage bilaterally.  Orthopedic  No limitations of motion  feet .  No crepitus or effusions noted.  No bony pathology or digital deformities noted.  No  STJ ROM left foot.  Skin  normotropic skin with no porokeratosis noted bilaterally.  No signs of infections or ulcers noted.   Callus lateral aspect fifth metabase left foot asymptomatic.   Onychomycosis  Pain in right toes  Pain in left toes  Consent was obtained for treatment procedures.   Mechanical debridement of nails 1-5  bilaterally performed with a nail nipper.  Filed with dremel without incident.   Checked on his shoes and pecorthist says they will be in soon.     Helane Gunther DPM

## 2023-07-25 NOTE — Telephone Encounter (Signed)
Patient came in. Checking on status of his diabetic shoes

## 2023-07-26 DIAGNOSIS — N2581 Secondary hyperparathyroidism of renal origin: Secondary | ICD-10-CM | POA: Diagnosis not present

## 2023-07-26 DIAGNOSIS — Z992 Dependence on renal dialysis: Secondary | ICD-10-CM | POA: Diagnosis not present

## 2023-07-26 DIAGNOSIS — N186 End stage renal disease: Secondary | ICD-10-CM | POA: Diagnosis not present

## 2023-07-28 DIAGNOSIS — N2581 Secondary hyperparathyroidism of renal origin: Secondary | ICD-10-CM | POA: Diagnosis not present

## 2023-07-28 DIAGNOSIS — N186 End stage renal disease: Secondary | ICD-10-CM | POA: Diagnosis not present

## 2023-07-28 DIAGNOSIS — Z992 Dependence on renal dialysis: Secondary | ICD-10-CM | POA: Diagnosis not present

## 2023-07-29 ENCOUNTER — Ambulatory Visit (HOSPITAL_COMMUNITY): Payer: Self-pay | Admitting: Pharmacist

## 2023-07-29 ENCOUNTER — Ambulatory Visit: Payer: Medicare HMO

## 2023-07-29 VITALS — Ht 70.0 in | Wt 197.0 lb

## 2023-07-29 DIAGNOSIS — Z0000 Encounter for general adult medical examination without abnormal findings: Secondary | ICD-10-CM | POA: Diagnosis not present

## 2023-07-29 LAB — POCT INR: INR: 2.7 (ref 2.0–3.0)
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
## 2023-07-29 NOTE — Patient Instructions (Addendum)
Marcus Johnson , Thank you for taking time to come for your Medicare Wellness Visit. I appreciate your ongoing commitment to your health goals. Please review the following plan we discussed and let me know if I can assist you in the future.   Referrals/Orders/Follow-Ups/Clinician Recommendations:   This is a list of the screening recommended for you and due dates:  Health Maintenance  Topic Date Due   DTaP/Tdap/Td vaccine (1 - Tdap) Never done   Zoster (Shingles) Vaccine (1 of 2) Never done   Cologuard (Stool DNA test)  Never done   Eye exam for diabetics  07/12/2021   COVID-19 Vaccine (4 - 2024-25 season) 04/07/2023   Complete foot exam   06/28/2023   Hemoglobin A1C  11/21/2023   Medicare Annual Wellness Visit  07/28/2024   Flu Shot  Completed   Hepatitis C Screening  Completed   HIV Screening  Completed   HPV Vaccine  Aged Out    Advanced directives: (In Chart) A copy of your advanced directives are scanned into your chart should your provider ever need it.  Next Medicare Annual Wellness Visit scheduled for next year: Yes

## 2023-07-30 DIAGNOSIS — Z992 Dependence on renal dialysis: Secondary | ICD-10-CM | POA: Diagnosis not present

## 2023-07-30 DIAGNOSIS — N2581 Secondary hyperparathyroidism of renal origin: Secondary | ICD-10-CM | POA: Diagnosis not present

## 2023-07-30 DIAGNOSIS — N186 End stage renal disease: Secondary | ICD-10-CM | POA: Diagnosis not present

## 2023-08-02 DIAGNOSIS — Z992 Dependence on renal dialysis: Secondary | ICD-10-CM | POA: Diagnosis not present

## 2023-08-02 DIAGNOSIS — N186 End stage renal disease: Secondary | ICD-10-CM | POA: Diagnosis not present

## 2023-08-02 DIAGNOSIS — N2581 Secondary hyperparathyroidism of renal origin: Secondary | ICD-10-CM | POA: Diagnosis not present

## 2023-08-04 DIAGNOSIS — Z992 Dependence on renal dialysis: Secondary | ICD-10-CM | POA: Diagnosis not present

## 2023-08-04 DIAGNOSIS — N2581 Secondary hyperparathyroidism of renal origin: Secondary | ICD-10-CM | POA: Diagnosis not present

## 2023-08-04 DIAGNOSIS — N186 End stage renal disease: Secondary | ICD-10-CM | POA: Diagnosis not present

## 2023-08-05 ENCOUNTER — Ambulatory Visit (HOSPITAL_COMMUNITY): Payer: Self-pay | Admitting: Pharmacist

## 2023-08-05 LAB — POCT INR: INR: 2.3 (ref 2.0–3.0)

## 2023-08-06 DIAGNOSIS — N2581 Secondary hyperparathyroidism of renal origin: Secondary | ICD-10-CM | POA: Diagnosis not present

## 2023-08-06 DIAGNOSIS — I509 Heart failure, unspecified: Secondary | ICD-10-CM | POA: Diagnosis not present

## 2023-08-06 DIAGNOSIS — Z992 Dependence on renal dialysis: Secondary | ICD-10-CM | POA: Diagnosis not present

## 2023-08-06 DIAGNOSIS — N186 End stage renal disease: Secondary | ICD-10-CM | POA: Diagnosis not present

## 2023-08-09 DIAGNOSIS — Z992 Dependence on renal dialysis: Secondary | ICD-10-CM | POA: Diagnosis not present

## 2023-08-09 DIAGNOSIS — N2581 Secondary hyperparathyroidism of renal origin: Secondary | ICD-10-CM | POA: Diagnosis not present

## 2023-08-09 DIAGNOSIS — N186 End stage renal disease: Secondary | ICD-10-CM | POA: Diagnosis not present

## 2023-08-12 DIAGNOSIS — N2581 Secondary hyperparathyroidism of renal origin: Secondary | ICD-10-CM | POA: Diagnosis not present

## 2023-08-12 DIAGNOSIS — Z992 Dependence on renal dialysis: Secondary | ICD-10-CM | POA: Diagnosis not present

## 2023-08-12 DIAGNOSIS — N186 End stage renal disease: Secondary | ICD-10-CM | POA: Diagnosis not present

## 2023-08-13 ENCOUNTER — Ambulatory Visit (INDEPENDENT_AMBULATORY_CARE_PROVIDER_SITE_OTHER): Payer: Medicare HMO

## 2023-08-13 ENCOUNTER — Ambulatory Visit (HOSPITAL_COMMUNITY): Payer: Self-pay | Admitting: Pharmacist

## 2023-08-13 DIAGNOSIS — M2141 Flat foot [pes planus] (acquired), right foot: Secondary | ICD-10-CM

## 2023-08-13 DIAGNOSIS — Z794 Long term (current) use of insulin: Secondary | ICD-10-CM | POA: Diagnosis not present

## 2023-08-13 DIAGNOSIS — E1122 Type 2 diabetes mellitus with diabetic chronic kidney disease: Secondary | ICD-10-CM

## 2023-08-13 DIAGNOSIS — M2010 Hallux valgus (acquired), unspecified foot: Secondary | ICD-10-CM

## 2023-08-13 DIAGNOSIS — M2142 Flat foot [pes planus] (acquired), left foot: Secondary | ICD-10-CM

## 2023-08-13 DIAGNOSIS — Z7901 Long term (current) use of anticoagulants: Secondary | ICD-10-CM | POA: Diagnosis not present

## 2023-08-13 DIAGNOSIS — N186 End stage renal disease: Secondary | ICD-10-CM

## 2023-08-13 DIAGNOSIS — Z992 Dependence on renal dialysis: Secondary | ICD-10-CM

## 2023-08-13 LAB — POCT INR: INR: 3.1 — AB (ref 2.0–3.0)

## 2023-08-13 NOTE — Progress Notes (Signed)

## 2023-08-14 DIAGNOSIS — N2581 Secondary hyperparathyroidism of renal origin: Secondary | ICD-10-CM | POA: Diagnosis not present

## 2023-08-14 DIAGNOSIS — N186 End stage renal disease: Secondary | ICD-10-CM | POA: Diagnosis not present

## 2023-08-14 DIAGNOSIS — Z992 Dependence on renal dialysis: Secondary | ICD-10-CM | POA: Diagnosis not present

## 2023-08-16 DIAGNOSIS — Z992 Dependence on renal dialysis: Secondary | ICD-10-CM | POA: Diagnosis not present

## 2023-08-16 DIAGNOSIS — N2581 Secondary hyperparathyroidism of renal origin: Secondary | ICD-10-CM | POA: Diagnosis not present

## 2023-08-16 DIAGNOSIS — N186 End stage renal disease: Secondary | ICD-10-CM | POA: Diagnosis not present

## 2023-08-19 ENCOUNTER — Other Ambulatory Visit (HOSPITAL_COMMUNITY): Payer: Self-pay | Admitting: *Deleted

## 2023-08-19 DIAGNOSIS — Z7901 Long term (current) use of anticoagulants: Secondary | ICD-10-CM

## 2023-08-19 DIAGNOSIS — I5042 Chronic combined systolic (congestive) and diastolic (congestive) heart failure: Secondary | ICD-10-CM

## 2023-08-19 DIAGNOSIS — Z79899 Other long term (current) drug therapy: Secondary | ICD-10-CM

## 2023-08-19 DIAGNOSIS — Z95811 Presence of heart assist device: Secondary | ICD-10-CM

## 2023-08-19 DIAGNOSIS — Z992 Dependence on renal dialysis: Secondary | ICD-10-CM | POA: Diagnosis not present

## 2023-08-19 DIAGNOSIS — N2581 Secondary hyperparathyroidism of renal origin: Secondary | ICD-10-CM | POA: Diagnosis not present

## 2023-08-19 DIAGNOSIS — N186 End stage renal disease: Secondary | ICD-10-CM | POA: Diagnosis not present

## 2023-08-20 ENCOUNTER — Ambulatory Visit (HOSPITAL_COMMUNITY)
Admission: RE | Admit: 2023-08-20 | Discharge: 2023-08-20 | Disposition: A | Discharge status: Home / Self Care | Payer: Medicare HMO | Source: Ambulatory Visit | Attending: Cardiology | Admitting: Cardiology

## 2023-08-20 ENCOUNTER — Ambulatory Visit (HOSPITAL_COMMUNITY): Payer: Self-pay | Admitting: Pharmacist

## 2023-08-20 DIAGNOSIS — I255 Ischemic cardiomyopathy: Secondary | ICD-10-CM | POA: Diagnosis not present

## 2023-08-20 DIAGNOSIS — I2510 Atherosclerotic heart disease of native coronary artery without angina pectoris: Secondary | ICD-10-CM | POA: Diagnosis not present

## 2023-08-20 DIAGNOSIS — Z95811 Presence of heart assist device: Secondary | ICD-10-CM

## 2023-08-20 DIAGNOSIS — E1122 Type 2 diabetes mellitus with diabetic chronic kidney disease: Secondary | ICD-10-CM | POA: Diagnosis not present

## 2023-08-20 DIAGNOSIS — I5042 Chronic combined systolic (congestive) and diastolic (congestive) heart failure: Secondary | ICD-10-CM

## 2023-08-20 DIAGNOSIS — I4892 Unspecified atrial flutter: Secondary | ICD-10-CM | POA: Insufficient documentation

## 2023-08-20 DIAGNOSIS — Z79899 Other long term (current) drug therapy: Secondary | ICD-10-CM | POA: Diagnosis not present

## 2023-08-20 DIAGNOSIS — N186 End stage renal disease: Secondary | ICD-10-CM | POA: Diagnosis not present

## 2023-08-20 DIAGNOSIS — Z9889 Other specified postprocedural states: Secondary | ICD-10-CM | POA: Diagnosis not present

## 2023-08-20 DIAGNOSIS — I4891 Unspecified atrial fibrillation: Secondary | ICD-10-CM | POA: Diagnosis not present

## 2023-08-20 DIAGNOSIS — M79641 Pain in right hand: Secondary | ICD-10-CM | POA: Insufficient documentation

## 2023-08-20 DIAGNOSIS — E1142 Type 2 diabetes mellitus with diabetic polyneuropathy: Secondary | ICD-10-CM | POA: Insufficient documentation

## 2023-08-20 DIAGNOSIS — B9561 Methicillin susceptible Staphylococcus aureus infection as the cause of diseases classified elsewhere: Secondary | ICD-10-CM | POA: Insufficient documentation

## 2023-08-20 DIAGNOSIS — I132 Hypertensive heart and chronic kidney disease with heart failure and with stage 5 chronic kidney disease, or end stage renal disease: Secondary | ICD-10-CM | POA: Diagnosis not present

## 2023-08-20 DIAGNOSIS — Z7901 Long term (current) use of anticoagulants: Secondary | ICD-10-CM | POA: Diagnosis not present

## 2023-08-20 DIAGNOSIS — Z951 Presence of aortocoronary bypass graft: Secondary | ICD-10-CM | POA: Diagnosis not present

## 2023-08-20 DIAGNOSIS — I5022 Chronic systolic (congestive) heart failure: Secondary | ICD-10-CM | POA: Diagnosis not present

## 2023-08-20 DIAGNOSIS — D649 Anemia, unspecified: Secondary | ICD-10-CM | POA: Insufficient documentation

## 2023-08-20 LAB — COMPREHENSIVE METABOLIC PANEL
ALT: 20 U/L (ref 0–44)
AST: 33 U/L (ref 15–41)
Albumin: 4.2 g/dL (ref 3.5–5.0)
Alkaline Phosphatase: 97 U/L (ref 38–126)
Anion gap: 13 (ref 5–15)
BUN: 26 mg/dL — ABNORMAL HIGH (ref 8–23)
CO2: 30 mmol/L (ref 22–32)
Calcium: 8 mg/dL — ABNORMAL LOW (ref 8.9–10.3)
Chloride: 97 mmol/L — ABNORMAL LOW (ref 98–111)
Creatinine, Ser: 5.54 mg/dL — ABNORMAL HIGH (ref 0.61–1.24)
GFR, Estimated: 11 mL/min — ABNORMAL LOW (ref >60)
Glucose, Bld: 126 mg/dL — ABNORMAL HIGH (ref 70–99)
Potassium: 4.1 mmol/L (ref 3.5–5.1)
Sodium: 140 mmol/L (ref 135–145)
Total Bilirubin: 0.9 mg/dL (ref 0.0–1.2)
Total Protein: 8.1 g/dL (ref 6.5–8.1)

## 2023-08-20 LAB — PROTIME-INR
INR: 1.7 — ABNORMAL HIGH (ref 0.8–1.2)
Prothrombin Time: 20.6 s — ABNORMAL HIGH (ref 11.4–15.2)

## 2023-08-20 LAB — MAGNESIUM: Magnesium: 2 mg/dL (ref 1.7–2.4)

## 2023-08-20 LAB — LIPID PANEL
Cholesterol: 106 mg/dL (ref 0–200)
HDL: 28 mg/dL — ABNORMAL LOW (ref >40)
LDL Cholesterol: 49 mg/dL (ref 0–99)
Total CHOL/HDL Ratio: 3.8 {ratio}
Triglycerides: 144 mg/dL (ref <150)
VLDL: 29 mg/dL (ref 0–40)

## 2023-08-20 LAB — T4, FREE: Free T4: 1.39 ng/dL — ABNORMAL HIGH (ref 0.61–1.12)

## 2023-08-20 LAB — LACTATE DEHYDROGENASE: LDH: 198 U/L — ABNORMAL HIGH (ref 98–192)

## 2023-08-20 LAB — CBC
HCT: 35.8 % — ABNORMAL LOW (ref 39.0–52.0)
Hemoglobin: 11.1 g/dL — ABNORMAL LOW (ref 13.0–17.0)
MCH: 28.9 pg (ref 26.0–34.0)
MCHC: 31 g/dL (ref 30.0–36.0)
MCV: 93.2 fL (ref 80.0–100.0)
Platelets: 138 10*3/uL — ABNORMAL LOW (ref 150–400)
RBC: 3.84 MIL/uL — ABNORMAL LOW (ref 4.22–5.81)
RDW: 18.6 % — ABNORMAL HIGH (ref 11.5–15.5)
WBC: 9 10*3/uL (ref 4.0–10.5)
nRBC: 0 % (ref 0.0–0.2)

## 2023-08-20 LAB — TSH: TSH: 3.265 u[IU]/mL (ref 0.350–4.500)

## 2023-08-20 NOTE — Patient Instructions (Addendum)
 No medication changes Coumadin  dosing per Lauren PharmD Return to clinic in 2 months. We will complete your 3 year Intermacs at this visit. Please bring your battery charger, MPU, and black bag for annual maintenance. Please wear tennis shoes for your 6 minute walk

## 2023-08-20 NOTE — Progress Notes (Addendum)
Patient presents for 2 mo follow up. Reports no problems with VAD equipment or concerns with drive line.  Patient arrived via wheelchair today but was able to step on the scale for his weight. Pt denies dizziness, falls, heart failure symptoms, or signs of bleeding (other than minor nosebleeds due to dry air). Complains of intermittent lightheadedness and fatigue on dialysis days. Still experiencing intermittent nausea on HD days, but this has improved. He uses PRN Zofran  occasionally.   BP elevated today. Discussed with Dr Rolan. Minimal PI events, flows appropriate on VAD. Will continue to monitor at this time.   Pt has not had anymore ICD shocks since his last visit and is taking his Amiodarone  200 BID and Mexiletine 150 mg BID. [employer] with [employer]tr[employer] in to see pt to reprogram device vectors per Dr Celine request.   Patient reports he continues M/W/F dialysis. Pt confirms he takes Midodrine  TID on HD days only. He tells me that if he feels lightheaded on non HD days at times and if this happens he takes a Midodrine , but this is rare.   Pt on Keflex  500 mg BID (dosed for HD schedule). Silver reports they are changing dressing weekly. Currently wearing 2 anchors. Reports he wears binder at home, but does not like to wear during dialysis.   Pt scheduled for cataract removal at [facility] 09/26/23.   Dry weight at HD: 87.5 kg. Pt states they typically pull ~4 L at each treatment.    Vital Signs:  Doppler Pressure: 120 Automatic BP: 123/109 (116) HR: 83 NSR O2: 100% RA   Weight: 195 lbs w/ eqt Last wt: 197 w/ eqt   VAD Indication: Destination Therapy due to CKD Stage 3b   LVAD assessment:  HM III: Speed: 5700 rpms Flow: 5.0 Power: 4.6 w    PI: 3.4 Alarms: none Events: 0 - 5  Fixed speed:  5700 Low speed limit:  5400  Primary controller: Back up battery due for replacement in 22 months Secondary controller: Back up battery due for replacement in 30 months  I reviewed the LVAD  parameters from today and compared the results to the patient's prior recorded data. LVAD interrogation was NEGATIVE for significant power changes, NEGATIVE for clinical alarms and STABLE for PI events/speed drops. No programming changes were made and pump is functioning within specified parameters. Pt is performing daily controller and system monitor self tests along with completing weekly and monthly maintenance for LVAD equipment.   LVAD equipment check completed and is in good working order. Back-up equipment present.    Annual Equipment Maintenance on UBC/PM was performed 10/23/22.  Exit Site Care: Dressing maintained weekly by pt's brother. Dressing is clean, dry, and intact. Anchors correctly applied. Pt denies fever or chills. May continue weekly dressing using daily kits. Pt given tape today, 7 daily kits, and 10 cath grip anchors.   Device: Medtronic single Therapies: VF: 240 VT: 200 Slow VT: 143 Pacing: VVI 40 Last check: 07/09/23   BP & Labs:  Doppler 120 - Doppler is reflecting modified systolic  Hgb 11.1- No S/S of bleeding. Specifically denies melena/BRBPR or nosebleeds.   LDH 198- established baseline of 160 - 180. Denies tea-colored urine. No power elevations noted on interrogation.   Plan: No medication changes Coumadin  dosing per Lauren PharmD Return to clinic in 2 months. We will complete your 3 year Intermacs at this visit. Please bring your battery charger, MPU, and black bag for annual maintenance. Please wear tennis shoes for your 6  minute walk  Isaiah Knoll RN VAD Coordinator  Office: [phone]  24/7 Pager: [phone]   Follow up for Heart Failure/LVAD:  Garen Woolbright. is a 63 y.o. male who has a h/o DM2, CAD s/p CABG 2002, systolic HF due to ischemic cardiomyopathy with EF 20-25% (echo 12/15), DM2 and CKD. He is s/p Medtronic single chamber ICD.   Admitted in 12/15 due to ADHF. Required short course milrinone  for diuresis. Diuresed 30  pounds.    CPX 2/16 showed severely reduced functional capacity.  There was a significant disconnect between his symptoms and the CPX.  RHC in 6/16 showed fairly normal filling pressures and low but not markedly low cardiac output.    CPX (4/19) was submaximal but suggested severe limitation from HF.    He was admitted in 6/19 with marked volume overload and dyspnea.  Echo in 6/19 showed EF 20% with severe global hypokinesis.  LHC/RHC showed severe native vessel CAD with patent LIMA-D, SVG-OM, and SVG-PLV; cardiac index low.  He was started on milrinone  and diuresed.  We discussed LVAD extensively in light of low output, creatinine that is trending up and concern for decompensation of RV over time. He was adamant that he did not want to pursue LVAD workup yet and did not want a referral for transplant evaluation. Milrinone  was weaned off and he was discharged home.    Admitted 08/01/18-08/07/18 with volume overload and left foot wound. Diuresed 38 lbs with IV lasix  and then transitioned back to torsemide  100 mg BID. ID was consulted with concerns for left foot osteomyelitis noted on CT scan. He got IV ancef  q8 hours x 42 days. [facility] provided teaching and supplies for home antibiotics. Course complicated by AKI. DC weight 200 lbs.    He went into atrial flutter and had DCCV to NSR in 1/20.    Echo in 4/21 showed EF 25% with apical akinesis and diffuse hypokinesis relatively preserved in lateral wall, mildly decreased RV systolic function, PASP 41 mmHg. CPX in 5/21 showed severe functional limitation suggestive of advanced HF. 5/21 Zio patch showed short atrial flutter runs, 3.8% PVCs.    He developed COVID-19 infection in 1/22.  Symptoms were predominantly GI.    He developed progressive worsening of CHF in early 2022 and was admitted in 3/22 with low output HF.  Impella 5.5 was placed as bridge to Heartmate III LVAD placed in 10/13/20.  He developed post-op renal failure and ended up on dialysis.  He was  markedly deconditioned post-op and went to CIR.    Still has pain and minimal function in his right hand.  Nerve conduction study showed a chronic sensorimotor neuropathy in the right arm.    Ramp echo in 3/23 showed severe LV dysfunction with mild RV enlargement and mild dysfunction, mild-moderate AI.  Speed was increased to 5800 rpm.  The aortic valve still opened with every beat but only slightly. Flow increased from 4.1 to 5.3 L/min and PI remained stable.   Patient was admitted in 4/23 with falls and lightheadedness. He fractured his left foot when falling and initially was unable to walk.  He was thought to be hypotensive due to over-diuresis and dry weight was increased.  Midodrine  on HD days was also increased.  He was treated for a driveline infection with vancomycin  then doxycycline , now off.   ICD shock for VT in 7/23, not started on amiodarone  given only 1 episode.   Echo in 3/24 showed EF 20-25% with midline IV septum,  mild RV dysfunction with severe RVE, moderate AI.   In 5/24, patient had VT treated with ATP.      Patient was shocked by his ICD for VT on 04/17/23, 04/20/23, and 04/21/23.  I had him increase his amiodarone  to 200 mg bid x 2 wks then back to 200 mg daily.  After discussion with Dr. Fernande, I started him on mexiletine as well.    Ramp echo was in 9/24.  Starting speed 5800 rpm with moderate AI, slight aortic valve opening each beat, moderate RV enlargement/moderate RV dysfunction, septum shifted slightly to the left.  Speed decreased to 5700 rpm with moderate AI, aortic valve opening each beat, midline septum with flow fairly stable at 5 L/min.  I left speed at 5700 rpm.   Patient returns for followup of LVAD.  MAP elevated today, but also at times runs low on non-HD days.  BP is stable on HD days as long as he takes midodrine  pre-HD.  He is not taking midodrine  on non-HD days.  He still has some nausea with dialysis but this has improved significantly.  He feels like he is  doing well overall.  He is getting out more, goes shopping.  No dyspnea walking on flat ground.  Weight down 2 lbs. LVAD parameters are stable and device interrogation shows no recent sustained VT.     Labs (5/22): LDH 174, hgb 10.6 Labs (7/22): LDH 164 Labs (8/22): hgb 11.4 Labs (10/22): hgb 12 Labs (11/22): hgb 12.2 Labs (4/23): LDL 163, hgb 10.4 Labs (6/23): hgb 12.2, LDH 284 Labs (7/23): LFTs normal, hgb 11.3, LDH 235 Labs (9/23): hgb 10.2 Labs (1/24): hgb 10.6, LDH 279 Labs (3/24): LDH 222, hgb 11.2 Labs (6/24): LDH 219, hgb 11.3, TSH normal Labs (9/24): TSH normal, hgb 12, LFTs normal, LDH 215  Past Medical History:  Diagnosis Date   AICD (automatic cardioverter/defibrillator) present    Single-chamber  implantable cardiac defibrillator - Medtronic   Atrial fibrillation (HCC)    Cataract    Mixed form OU   CHF (congestive heart failure) (HCC)    Chronic kidney disease    Chronic kidney disease (CKD), stage III (moderate) (HCC)    Chronic systolic heart failure (HCC)    a. Echo 5/13: Mild LVE, mild LVH, EF 10%, anteroseptal, lateral, apical AK, mild MR, mild LAE, moderately reduced RVSF, mild RAE, PASP 60;  b. 07/2014 Echo: EF 20-2%, diff HK, AKI of antsep/apical/mid-apicalinferior, mod reduced RV.   Coronary artery disease    a. s/p CABG 2002 b. LHC 5/13:  dLM 80%, LAD subtotally occluded, pCFX occluded, pRCA 50%, mid? Occlusion with high take off of the PDA with 70% multiple lesions-not bypassed and supplies collaterals to LAD, LIMA-IM/ramus ok, S-OM ok, S-PLA branches ok. Medical therapy was recommended   COVID    Diabetic retinopathy (HCC)    NPDR OD, PDR OS   Dyspnea    Gout    on daily RX (01/08/2018)   Hypertension    Hypertensive retinopathy    OU   Ischemic cardiomyopathy    a. Echo 5/13: Mild LVE, mild LVH, EF 10%, anteroseptal, lateral, apical AK, mild MR, mild LAE, moderately reduced RVSF, mild RAE, PASP 60;  b. 01/2012 s/p MDT [id number] Protecta XT VR AICD;  c.  07/2014 Echo: EF 20-2%, diff HK, AKI of antsep/apical/mid-apicalinferior, mod reduced RV.   MRSA (methicillin resistant Staphylococcus aureus)    Status post right foot plantar deep infection with MRSA status post  I&D 10/2008  Myocardial infarction [facility])    was told I'd had several before heart OR in 2002 (01/08/2018)   Noncompliance    Peripheral neuropathy    Retinopathy, diabetic, background (HCC)    Syncope    Type II diabetes mellitus (HCC)    requiring insulin     Vitreous hemorrhage, left (HCC)    and proliferative diabetic retinopathy    Current Outpatient Medications  Medication Sig Dispense Refill   acetaminophen  (TYLENOL ) 325 MG tablet Take by mouth.     allopurinol  (ZYLOPRIM ) 300 MG tablet Take by mouth.     amiodarone  (PACERONE ) 200 MG tablet Take 1 tablet (200 mg total) by mouth 2 (two) times daily. Take 1 tablet (200 mg) by mouth twice daily for 2 weeks starting 04/23/23, then decrease to 1 tablet (200 mg) daily 05/07/23. (Patient taking differently: Take 200 mg by mouth daily.) 90 tablet 3   BD PEN NEEDLE NANO 2ND GEN 32G X 4 MM MISC USE TO INJECT INSULIN  DAILY 100 each 0   Blood Glucose Monitoring Suppl (ONE TOUCH ULTRA 2) w/Device KIT 1 Device by Does not apply route 3 (three) times daily. 1 kit 0   cephALEXin  (KEFLEX ) 500 MG capsule Take 1 capsule (500 mg total) by mouth 2 (two) times daily. Take evening dose after dialysis on HD days. 60 capsule 5   gabapentin  (NEURONTIN ) 100 MG capsule TAKE 1 CAPSULE ( 100 MG) IN THE MORNING AND 2 CAPSULES ( 200 MG) AT BEDTIME 90 capsule 3   glucose blood (ACCU-CHEK GUIDE) test strip USE AS INSTRUCTED 3 TIMES DAILY 100 strip 1   insulin  glargine (LANTUS  SOLOSTAR) 100 UNIT/ML Solostar Pen Inject 60 Units into the skin at bedtime. (Patient taking differently: Inject 60 Units into the skin daily as needed.) 30 mL 11   mexiletine (MEXITIL) 150 MG capsule Take 1 capsule (150 mg total) by mouth 2 (two) times daily. 180 capsule 3   midodrine   (PROAMATINE ) 10 MG tablet Take 2 tablets (20 mg total) by mouth 3 (three) times daily. Three times a day on dialysis days; NO midodrine  on NON dialysis days 160 tablet 11   multivitamin (RENA-VIT) TABS tablet TAKE 1 TABLET BY MOUTH EVERYDAY AT BEDTIME 90 tablet 3   ondansetron  (ZOFRAN ) 4 MG tablet Take 1 tablet (4 mg total) by mouth every 8 (eight) hours as needed for nausea or vomiting. 30 tablet 3   OneTouch UltraSoft 2 Lancets MISC 1 each by Does not apply route 3 (three) times daily before meals. 100 each 1   pantoprazole  (PROTONIX ) 40 MG tablet TAKE 1 TABLET BY MOUTH EVERY DAY 90 tablet 3   rosuvastatin  (CRESTOR ) 10 MG tablet Take 1 tablet (10 mg total) by mouth daily. 90 tablet 3   sevelamer (RENAGEL) 800 MG tablet Take 800 mg by mouth 3 (three) times daily.     traMADol  (ULTRAM ) 50 MG tablet TAKE 1 TABLET BY MOUTH EVERY 12 HOURS AS NEEDED 30 tablet 3   warfarin (COUMADIN ) 3 MG tablet TAKE 4.5 MG (1.5 TABS) DAILY OR AS DIRECTED BY HF CLINIC 150 tablet 3   naphazoline-glycerin  (CLEAR EYES REDNESS) 0.012-0.25 % SOLN Place 1-2 drops into the right eye 4 (four) times daily as needed for eye irritation. (Patient not taking: Reported on 08/20/2023)     polyethylene glycol powder (GLYCOLAX /MIRALAX ) 17 GM/SCOOP powder  (Patient not taking: Reported on 08/20/2023)     No current facility-administered medications for this encounter.    Meclizine  hcl and Ivabradine   REVIEW OF SYSTEMS: All  systems negative except as listed in HPI, PMH and Problem list.   LVAD INTERROGATION:  Please see LVAD nurse's note above.   Physical Exam: General: Well appearing this am. NAD.  HEENT: Normal. Neck: Supple, JVP 10 cm. Carotids OK.  Cardiac:  Mechanical heart sounds with LVAD hum present.  Lungs:  CTAB, normal effort.  Abdomen:  NT, ND, no HSM. No bruits or masses. +BS  LVAD exit site: Well-healed and incorporated. Dressing dry and intact. No erythema or drainage. Stabilization device present and accurately  applied. Driveline dressing changed daily per sterile technique. Extremities:  Warm and dry. No cyanosis, clubbing, rash, or edema.  Neuro:  Alert & oriented x 3. Cranial nerves grossly intact. Moves all 4 extremities w/o difficulty. Affect pleasant     ASSESSMENT AND PLAN:  1. Chronic systolic CHF:  Ischemic cardiomyopathy. Has Medtronic ICD. Low output HF, admitted and Impella 5.5 placed 10/04/20.  HM3 LVAD was placed on 10/13/20. Developed post-op renal failure requiring eventual hemodialysis.  Ramp echo in 3/23, speed increased to 5800 rpm under echo guidance with increase in flow and stable PI.  Echo in 3/24 showed EF 20-25% with midline IV septum, mild RV dysfunction with severe RVE, moderate AI. Ramp echo in 9/24 with decrease in speed in setting of VT episodes.  MAP elevated today but also low at times, needs midodrine  pre-HD. No further VT.  He does appear to have mild volume overload on exam.  LVAD interrogation shows occasional PI events, no low flows/suction.  - Continue midodrine  on HD days and reminded him not to take midodrine  on the non-HD days.    - Would not push lowering his dry weight though mildly volume overloaded on exam, weight is down from prior and he is very sensitive to volume depletion with lightheadedness.  2. LVAD:  VAD interrogated personally.  Parameters stable with occasional PI events on HD days. No low flow events or suction.  - He is now off ASA.  - Continue warfarin with INR goal 2-2.5.   3. CAD s/p CABG 2002:  Last cath in 6/19 with patent grafts. No chest pain.  - Continue Crestor , check lipids today.  4. ESRD: Currently dialyzing via tunneled catheter. No low flow events. He has had some nausea after dialysis but this seems to be improving.  - Continue HD M-W-F  - With continuous flow LVAD, think AV fistula would be unlikely to mature.   - He has Zofran  to use prn.  5. Atrial flutter/fibrillation: s/p DC-CV 08/21/18.  Has occasional episodes of AF but not  prolonged.  - Continue amiodarone , check LFTs and TSH today.  He will need regular eye exam.  6. 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 This has been improving though not completely normal.   7. DM: Per primary care.  8. Anemia: CBC today, no evidence for overt bleeding.  9. Suspected Gastroparesis: He is no longer taking Reglan .  10. VT: Episode in 7/23 with successful ICD discharge.  No definite trigger, he did not actually feel the VT. VT again in 5/24 treated with ATP, amiodarone  started.  Episodes of VT 9/11, 9/14, and 04/21/23.  Ramp echo in 9/24 with decreased speed.  No further VT.  - Continue amiodarone  200 mg daily.  - Continue mexiletine 150 mg bid.  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 Driveline site looks good today.   Followup 2 months.   Wynelle Dreier [employer]  08/20/2023                                                                                                   

## 2023-08-21 DIAGNOSIS — Z992 Dependence on renal dialysis: Secondary | ICD-10-CM | POA: Diagnosis not present

## 2023-08-21 DIAGNOSIS — N186 End stage renal disease: Secondary | ICD-10-CM | POA: Diagnosis not present

## 2023-08-21 DIAGNOSIS — N2581 Secondary hyperparathyroidism of renal origin: Secondary | ICD-10-CM | POA: Diagnosis not present

## 2023-08-21 LAB — T3, FREE: T3, Free: 1.9 pg/mL — ABNORMAL LOW (ref 2.0–4.4)
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
## 2023-08-22 ENCOUNTER — Other Ambulatory Visit: Payer: Self-pay

## 2023-08-22 ENCOUNTER — Ambulatory Visit (INDEPENDENT_AMBULATORY_CARE_PROVIDER_SITE_OTHER): Payer: Medicare HMO | Admitting: Ophthalmology

## 2023-08-22 ENCOUNTER — Encounter (INDEPENDENT_AMBULATORY_CARE_PROVIDER_SITE_OTHER): Payer: Self-pay | Admitting: Ophthalmology

## 2023-08-22 DIAGNOSIS — Z95811 Presence of heart assist device: Secondary | ICD-10-CM

## 2023-08-22 DIAGNOSIS — H35033 Hypertensive retinopathy, bilateral: Secondary | ICD-10-CM | POA: Diagnosis not present

## 2023-08-22 DIAGNOSIS — H25813 Combined forms of age-related cataract, bilateral: Secondary | ICD-10-CM | POA: Diagnosis not present

## 2023-08-22 DIAGNOSIS — E113513 Type 2 diabetes mellitus with proliferative diabetic retinopathy with macular edema, bilateral: Secondary | ICD-10-CM

## 2023-08-22 DIAGNOSIS — I10 Essential (primary) hypertension: Secondary | ICD-10-CM | POA: Diagnosis not present

## 2023-08-22 DIAGNOSIS — H4312 Vitreous hemorrhage, left eye: Secondary | ICD-10-CM

## 2023-08-22 DIAGNOSIS — Z794 Long term (current) use of insulin: Secondary | ICD-10-CM

## 2023-08-22 MED ORDER — CEPHALEXIN 500 MG PO CAPS: 500.0000 mg | ORAL_CAPSULE | Freq: Two times a day (BID) | ORAL | 2 refills | Status: AC

## 2023-08-22 MED ORDER — AFLIBERCEPT 2MG/0.05ML IZ SOLN FOR KALEIDOSCOPE
2.0000 mg | INTRAVITREAL | Status: AC | PRN
  Administered 2023-08-22: 2 mg via INTRAVITREAL

## 2023-08-23 DIAGNOSIS — N2581 Secondary hyperparathyroidism of renal origin: Secondary | ICD-10-CM | POA: Diagnosis not present

## 2023-08-23 DIAGNOSIS — N186 End stage renal disease: Secondary | ICD-10-CM | POA: Diagnosis not present

## 2023-08-23 DIAGNOSIS — Z992 Dependence on renal dialysis: Secondary | ICD-10-CM | POA: Diagnosis not present

## 2023-08-26 DIAGNOSIS — Z992 Dependence on renal dialysis: Secondary | ICD-10-CM | POA: Diagnosis not present

## 2023-08-26 DIAGNOSIS — N186 End stage renal disease: Secondary | ICD-10-CM | POA: Diagnosis not present

## 2023-08-26 DIAGNOSIS — N2581 Secondary hyperparathyroidism of renal origin: Secondary | ICD-10-CM | POA: Diagnosis not present

## 2023-08-27 ENCOUNTER — Ambulatory Visit (HOSPITAL_COMMUNITY): Payer: Self-pay | Admitting: Pharmacist

## 2023-08-27 LAB — POCT INR: INR: 2.1 (ref 2.0–3.0)

## 2023-08-28 DIAGNOSIS — Z992 Dependence on renal dialysis: Secondary | ICD-10-CM | POA: Diagnosis not present

## 2023-08-28 DIAGNOSIS — N2581 Secondary hyperparathyroidism of renal origin: Secondary | ICD-10-CM | POA: Diagnosis not present

## 2023-08-28 DIAGNOSIS — N186 End stage renal disease: Secondary | ICD-10-CM | POA: Diagnosis not present

## 2023-08-30 DIAGNOSIS — Z992 Dependence on renal dialysis: Secondary | ICD-10-CM | POA: Diagnosis not present

## 2023-08-30 DIAGNOSIS — N2581 Secondary hyperparathyroidism of renal origin: Secondary | ICD-10-CM | POA: Diagnosis not present

## 2023-08-30 DIAGNOSIS — N186 End stage renal disease: Secondary | ICD-10-CM | POA: Diagnosis not present

## 2023-09-02 DIAGNOSIS — N186 End stage renal disease: Secondary | ICD-10-CM | POA: Diagnosis not present

## 2023-09-02 DIAGNOSIS — Z992 Dependence on renal dialysis: Secondary | ICD-10-CM | POA: Diagnosis not present

## 2023-09-02 DIAGNOSIS — N2581 Secondary hyperparathyroidism of renal origin: Secondary | ICD-10-CM | POA: Diagnosis not present

## 2023-09-03 ENCOUNTER — Ambulatory Visit (HOSPITAL_COMMUNITY): Payer: Self-pay | Admitting: Pharmacist

## 2023-09-03 LAB — POCT INR: INR: 2.6 (ref 2.0–3.0)

## 2023-09-04 DIAGNOSIS — Z992 Dependence on renal dialysis: Secondary | ICD-10-CM | POA: Diagnosis not present

## 2023-09-04 DIAGNOSIS — N2581 Secondary hyperparathyroidism of renal origin: Secondary | ICD-10-CM | POA: Diagnosis not present

## 2023-09-04 DIAGNOSIS — N186 End stage renal disease: Secondary | ICD-10-CM | POA: Diagnosis not present

## 2023-09-05 DIAGNOSIS — N186 End stage renal disease: Secondary | ICD-10-CM | POA: Diagnosis not present

## 2023-09-05 DIAGNOSIS — Z992 Dependence on renal dialysis: Secondary | ICD-10-CM | POA: Diagnosis not present

## 2023-09-05 DIAGNOSIS — N2581 Secondary hyperparathyroidism of renal origin: Secondary | ICD-10-CM | POA: Diagnosis not present

## 2023-09-06 DIAGNOSIS — N2581 Secondary hyperparathyroidism of renal origin: Secondary | ICD-10-CM | POA: Diagnosis not present

## 2023-09-06 DIAGNOSIS — N186 End stage renal disease: Secondary | ICD-10-CM | POA: Diagnosis not present

## 2023-09-06 DIAGNOSIS — I509 Heart failure, unspecified: Secondary | ICD-10-CM | POA: Diagnosis not present

## 2023-09-06 DIAGNOSIS — Z992 Dependence on renal dialysis: Secondary | ICD-10-CM | POA: Diagnosis not present

## 2023-09-09 DIAGNOSIS — N186 End stage renal disease: Secondary | ICD-10-CM | POA: Diagnosis not present

## 2023-09-09 DIAGNOSIS — Z992 Dependence on renal dialysis: Secondary | ICD-10-CM | POA: Diagnosis not present

## 2023-09-09 DIAGNOSIS — N2581 Secondary hyperparathyroidism of renal origin: Secondary | ICD-10-CM | POA: Diagnosis not present

## 2023-09-10 ENCOUNTER — Ambulatory Visit (HOSPITAL_COMMUNITY): Payer: Self-pay | Admitting: Pharmacist

## 2023-09-10 DIAGNOSIS — Z7901 Long term (current) use of anticoagulants: Secondary | ICD-10-CM | POA: Diagnosis not present

## 2023-09-10 LAB — POCT INR: INR: 2.6 (ref 2.0–3.0)

## 2023-09-11 DIAGNOSIS — N2581 Secondary hyperparathyroidism of renal origin: Secondary | ICD-10-CM | POA: Diagnosis not present

## 2023-09-11 DIAGNOSIS — N186 End stage renal disease: Secondary | ICD-10-CM | POA: Diagnosis not present

## 2023-09-11 DIAGNOSIS — Z992 Dependence on renal dialysis: Secondary | ICD-10-CM | POA: Diagnosis not present

## 2023-09-13 DIAGNOSIS — Z992 Dependence on renal dialysis: Secondary | ICD-10-CM | POA: Diagnosis not present

## 2023-09-13 DIAGNOSIS — N186 End stage renal disease: Secondary | ICD-10-CM | POA: Diagnosis not present

## 2023-09-13 DIAGNOSIS — N2581 Secondary hyperparathyroidism of renal origin: Secondary | ICD-10-CM | POA: Diagnosis not present

## 2023-09-16 DIAGNOSIS — N186 End stage renal disease: Secondary | ICD-10-CM | POA: Diagnosis not present

## 2023-09-16 DIAGNOSIS — N2581 Secondary hyperparathyroidism of renal origin: Secondary | ICD-10-CM | POA: Diagnosis not present

## 2023-09-16 DIAGNOSIS — Z992 Dependence on renal dialysis: Secondary | ICD-10-CM | POA: Diagnosis not present

## 2023-09-17 ENCOUNTER — Ambulatory Visit (HOSPITAL_COMMUNITY): Payer: Self-pay | Admitting: Pharmacist

## 2023-09-17 LAB — POCT INR: INR: 2.4 (ref 2.0–3.0)

## 2023-09-18 DIAGNOSIS — N186 End stage renal disease: Secondary | ICD-10-CM | POA: Diagnosis not present

## 2023-09-18 DIAGNOSIS — N2581 Secondary hyperparathyroidism of renal origin: Secondary | ICD-10-CM | POA: Diagnosis not present

## 2023-09-18 DIAGNOSIS — Z992 Dependence on renal dialysis: Secondary | ICD-10-CM | POA: Diagnosis not present

## 2023-09-19 DIAGNOSIS — H2512 Age-related nuclear cataract, left eye: Secondary | ICD-10-CM | POA: Diagnosis not present

## 2023-09-20 DIAGNOSIS — N186 End stage renal disease: Secondary | ICD-10-CM | POA: Diagnosis not present

## 2023-09-20 DIAGNOSIS — N2581 Secondary hyperparathyroidism of renal origin: Secondary | ICD-10-CM | POA: Diagnosis not present

## 2023-09-20 DIAGNOSIS — Z992 Dependence on renal dialysis: Secondary | ICD-10-CM | POA: Diagnosis not present

## 2023-09-23 DIAGNOSIS — N186 End stage renal disease: Secondary | ICD-10-CM | POA: Diagnosis not present

## 2023-09-23 DIAGNOSIS — Z992 Dependence on renal dialysis: Secondary | ICD-10-CM | POA: Diagnosis not present

## 2023-09-23 DIAGNOSIS — N2581 Secondary hyperparathyroidism of renal origin: Secondary | ICD-10-CM | POA: Diagnosis not present

## 2023-09-24 ENCOUNTER — Ambulatory Visit (HOSPITAL_COMMUNITY): Payer: Self-pay | Admitting: Pharmacist

## 2023-09-24 LAB — POCT INR: INR: 2 (ref 2.0–3.0)

## 2023-09-25 DIAGNOSIS — Z992 Dependence on renal dialysis: Secondary | ICD-10-CM | POA: Diagnosis not present

## 2023-09-25 DIAGNOSIS — N2581 Secondary hyperparathyroidism of renal origin: Secondary | ICD-10-CM | POA: Diagnosis not present

## 2023-09-25 DIAGNOSIS — N186 End stage renal disease: Secondary | ICD-10-CM | POA: Diagnosis not present

## 2023-09-27 DIAGNOSIS — Z992 Dependence on renal dialysis: Secondary | ICD-10-CM | POA: Diagnosis not present

## 2023-09-27 DIAGNOSIS — N186 End stage renal disease: Secondary | ICD-10-CM | POA: Diagnosis not present

## 2023-09-27 DIAGNOSIS — N2581 Secondary hyperparathyroidism of renal origin: Secondary | ICD-10-CM | POA: Diagnosis not present

## 2023-09-30 DIAGNOSIS — N2581 Secondary hyperparathyroidism of renal origin: Secondary | ICD-10-CM | POA: Diagnosis not present

## 2023-09-30 DIAGNOSIS — Z992 Dependence on renal dialysis: Secondary | ICD-10-CM | POA: Diagnosis not present

## 2023-09-30 DIAGNOSIS — N186 End stage renal disease: Secondary | ICD-10-CM | POA: Diagnosis not present
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
## 2023-10-01 ENCOUNTER — Ambulatory Visit (HOSPITAL_COMMUNITY): Payer: Self-pay | Admitting: Pharmacist

## 2023-10-01 LAB — POCT INR: INR: 2.3 (ref 2.0–3.0)

## 2023-10-01 NOTE — Progress Notes (Signed)
 LVAD INR

## 2023-10-02 DIAGNOSIS — N2581 Secondary hyperparathyroidism of renal origin: Secondary | ICD-10-CM | POA: Diagnosis not present

## 2023-10-02 DIAGNOSIS — N186 End stage renal disease: Secondary | ICD-10-CM | POA: Diagnosis not present

## 2023-10-02 DIAGNOSIS — Z992 Dependence on renal dialysis: Secondary | ICD-10-CM | POA: Diagnosis not present

## 2023-10-03 ENCOUNTER — Encounter (INDEPENDENT_AMBULATORY_CARE_PROVIDER_SITE_OTHER): Payer: Self-pay

## 2023-10-03 ENCOUNTER — Encounter (INDEPENDENT_AMBULATORY_CARE_PROVIDER_SITE_OTHER): Payer: Medicare HMO | Admitting: Ophthalmology

## 2023-10-03 DIAGNOSIS — I10 Essential (primary) hypertension: Secondary | ICD-10-CM

## 2023-10-03 DIAGNOSIS — H35033 Hypertensive retinopathy, bilateral: Secondary | ICD-10-CM

## 2023-10-03 DIAGNOSIS — H25813 Combined forms of age-related cataract, bilateral: Secondary | ICD-10-CM

## 2023-10-03 DIAGNOSIS — Z794 Long term (current) use of insulin: Secondary | ICD-10-CM

## 2023-10-03 DIAGNOSIS — E113513 Type 2 diabetes mellitus with proliferative diabetic retinopathy with macular edema, bilateral: Secondary | ICD-10-CM

## 2023-10-03 DIAGNOSIS — H4312 Vitreous hemorrhage, left eye: Secondary | ICD-10-CM

## 2023-10-04 DIAGNOSIS — Z992 Dependence on renal dialysis: Secondary | ICD-10-CM | POA: Diagnosis not present

## 2023-10-04 DIAGNOSIS — I509 Heart failure, unspecified: Secondary | ICD-10-CM | POA: Diagnosis not present

## 2023-10-04 DIAGNOSIS — N186 End stage renal disease: Secondary | ICD-10-CM | POA: Diagnosis not present

## 2023-10-07 DIAGNOSIS — N2581 Secondary hyperparathyroidism of renal origin: Secondary | ICD-10-CM | POA: Diagnosis not present

## 2023-10-07 DIAGNOSIS — N186 End stage renal disease: Secondary | ICD-10-CM | POA: Diagnosis not present

## 2023-10-07 DIAGNOSIS — Z992 Dependence on renal dialysis: Secondary | ICD-10-CM | POA: Diagnosis not present

## 2023-10-08 ENCOUNTER — Ambulatory Visit (HOSPITAL_COMMUNITY): Payer: Self-pay | Admitting: Pharmacist

## 2023-10-08 ENCOUNTER — Ambulatory Visit (INDEPENDENT_AMBULATORY_CARE_PROVIDER_SITE_OTHER): Payer: Medicare HMO

## 2023-10-08 DIAGNOSIS — I5042 Chronic combined systolic (congestive) and diastolic (congestive) heart failure: Secondary | ICD-10-CM

## 2023-10-08 DIAGNOSIS — I255 Ischemic cardiomyopathy: Secondary | ICD-10-CM

## 2023-10-08 DIAGNOSIS — Z7901 Long term (current) use of anticoagulants: Secondary | ICD-10-CM | POA: Diagnosis not present

## 2023-10-08 LAB — POCT INR: INR: 2.9 (ref 2.0–3.0)

## 2023-10-09 DIAGNOSIS — N186 End stage renal disease: Secondary | ICD-10-CM | POA: Diagnosis not present

## 2023-10-09 DIAGNOSIS — Z992 Dependence on renal dialysis: Secondary | ICD-10-CM | POA: Diagnosis not present

## 2023-10-09 DIAGNOSIS — N2581 Secondary hyperparathyroidism of renal origin: Secondary | ICD-10-CM | POA: Diagnosis not present

## 2023-10-09 LAB — CUP PACEART REMOTE DEVICE CHECK
Battery Remaining Longevity: 109 mo
Battery Voltage: 2.99 V
Brady Statistic RV Percent Paced: 0.01 %
Date Time Interrogation Session: 20250304191635
HighPow Impedance: 33 Ohm
HighPow Impedance: 42 Ohm
Implantable Lead Connection Status: 753985
Implantable Lead Implant Date: 20130603
Implantable Lead Location: 753860
Implantable Lead Model: 6947
Implantable Pulse Generator Implant Date: 20211129
Lead Channel Impedance Value: 399 Ohm
Lead Channel Impedance Value: 665 Ohm
Lead Channel Pacing Threshold Amplitude: 0.75 V
Lead Channel Pacing Threshold Pulse Width: 0.4 ms
Lead Channel Sensing Intrinsic Amplitude: 3.75 mV
Lead Channel Sensing Intrinsic Amplitude: 3.75 mV
Lead Channel Setting Pacing Amplitude: 2 V
Lead Channel Setting Pacing Pulse Width: 0.4 ms
Lead Channel Setting Sensing Sensitivity: 0.3 mV
Zone Setting Status: 755011
Zone Setting Status: 755011

## 2023-10-11 DIAGNOSIS — N2581 Secondary hyperparathyroidism of renal origin: Secondary | ICD-10-CM | POA: Diagnosis not present

## 2023-10-11 DIAGNOSIS — Z992 Dependence on renal dialysis: Secondary | ICD-10-CM | POA: Diagnosis not present

## 2023-10-11 DIAGNOSIS — N186 End stage renal disease: Secondary | ICD-10-CM | POA: Diagnosis not present

## 2023-10-14 DIAGNOSIS — N186 End stage renal disease: Secondary | ICD-10-CM | POA: Diagnosis not present

## 2023-10-14 DIAGNOSIS — N2581 Secondary hyperparathyroidism of renal origin: Secondary | ICD-10-CM | POA: Diagnosis not present

## 2023-10-14 DIAGNOSIS — Z992 Dependence on renal dialysis: Secondary | ICD-10-CM | POA: Diagnosis not present

## 2023-10-15 ENCOUNTER — Ambulatory Visit (HOSPITAL_COMMUNITY): Payer: Self-pay | Admitting: Pharmacist

## 2023-10-15 LAB — POCT INR: INR: 1.9 — AB (ref 2.0–3.0)

## 2023-10-16 DIAGNOSIS — N2581 Secondary hyperparathyroidism of renal origin: Secondary | ICD-10-CM | POA: Diagnosis not present

## 2023-10-16 DIAGNOSIS — N186 End stage renal disease: Secondary | ICD-10-CM | POA: Diagnosis not present

## 2023-10-16 DIAGNOSIS — Z992 Dependence on renal dialysis: Secondary | ICD-10-CM | POA: Diagnosis not present

## 2023-10-18 DIAGNOSIS — N186 End stage renal disease: Secondary | ICD-10-CM | POA: Diagnosis not present

## 2023-10-18 DIAGNOSIS — N2581 Secondary hyperparathyroidism of renal origin: Secondary | ICD-10-CM | POA: Diagnosis not present

## 2023-10-18 DIAGNOSIS — Z992 Dependence on renal dialysis: Secondary | ICD-10-CM | POA: Diagnosis not present

## 2023-10-21 DIAGNOSIS — N186 End stage renal disease: Secondary | ICD-10-CM | POA: Diagnosis not present

## 2023-10-21 DIAGNOSIS — Z992 Dependence on renal dialysis: Secondary | ICD-10-CM | POA: Diagnosis not present

## 2023-10-21 DIAGNOSIS — N2581 Secondary hyperparathyroidism of renal origin: Secondary | ICD-10-CM | POA: Diagnosis not present

## 2023-10-22 ENCOUNTER — Ambulatory Visit (HOSPITAL_COMMUNITY): Payer: Self-pay | Admitting: Pharmacist

## 2023-10-22 LAB — POCT INR: INR: 1.9 — AB (ref 2.0–3.0)

## 2023-10-23 DIAGNOSIS — N2581 Secondary hyperparathyroidism of renal origin: Secondary | ICD-10-CM | POA: Diagnosis not present

## 2023-10-23 DIAGNOSIS — N186 End stage renal disease: Secondary | ICD-10-CM | POA: Diagnosis not present

## 2023-10-23 DIAGNOSIS — Z992 Dependence on renal dialysis: Secondary | ICD-10-CM | POA: Diagnosis not present

## 2023-10-25 DIAGNOSIS — N186 End stage renal disease: Secondary | ICD-10-CM | POA: Diagnosis not present

## 2023-10-25 DIAGNOSIS — N2581 Secondary hyperparathyroidism of renal origin: Secondary | ICD-10-CM | POA: Diagnosis not present

## 2023-10-25 DIAGNOSIS — Z992 Dependence on renal dialysis: Secondary | ICD-10-CM | POA: Diagnosis not present

## 2023-10-28 ENCOUNTER — Other Ambulatory Visit (HOSPITAL_COMMUNITY): Payer: Self-pay

## 2023-10-28 DIAGNOSIS — Z95811 Presence of heart assist device: Secondary | ICD-10-CM

## 2023-10-28 DIAGNOSIS — Z992 Dependence on renal dialysis: Secondary | ICD-10-CM | POA: Diagnosis not present

## 2023-10-28 DIAGNOSIS — N186 End stage renal disease: Secondary | ICD-10-CM | POA: Diagnosis not present

## 2023-10-28 DIAGNOSIS — Z7901 Long term (current) use of anticoagulants: Secondary | ICD-10-CM

## 2023-10-28 DIAGNOSIS — N2581 Secondary hyperparathyroidism of renal origin: Secondary | ICD-10-CM | POA: Diagnosis not present

## 2023-10-29 ENCOUNTER — Ambulatory Visit (HOSPITAL_COMMUNITY): Payer: Self-pay | Admitting: Pharmacist

## 2023-10-29 ENCOUNTER — Ambulatory Visit (HOSPITAL_COMMUNITY)
Admission: RE | Admit: 2023-10-29 | Discharge: 2023-10-29 | Disposition: A | Discharge status: Home / Self Care | Payer: Medicare HMO | Source: Ambulatory Visit | Attending: Cardiology | Admitting: Cardiology

## 2023-10-29 DIAGNOSIS — N186 End stage renal disease: Secondary | ICD-10-CM | POA: Insufficient documentation

## 2023-10-29 DIAGNOSIS — I4892 Unspecified atrial flutter: Secondary | ICD-10-CM | POA: Diagnosis not present

## 2023-10-29 DIAGNOSIS — I5022 Chronic systolic (congestive) heart failure: Secondary | ICD-10-CM | POA: Insufficient documentation

## 2023-10-29 DIAGNOSIS — Z8616 Personal history of COVID-19: Secondary | ICD-10-CM | POA: Diagnosis not present

## 2023-10-29 DIAGNOSIS — I252 Old myocardial infarction: Secondary | ICD-10-CM | POA: Diagnosis not present

## 2023-10-29 DIAGNOSIS — E1142 Type 2 diabetes mellitus with diabetic polyneuropathy: Secondary | ICD-10-CM | POA: Insufficient documentation

## 2023-10-29 DIAGNOSIS — I2510 Atherosclerotic heart disease of native coronary artery without angina pectoris: Secondary | ICD-10-CM | POA: Insufficient documentation

## 2023-10-29 DIAGNOSIS — Z95810 Presence of automatic (implantable) cardiac defibrillator: Secondary | ICD-10-CM | POA: Diagnosis not present

## 2023-10-29 DIAGNOSIS — M79641 Pain in right hand: Secondary | ICD-10-CM | POA: Diagnosis not present

## 2023-10-29 DIAGNOSIS — Z95811 Presence of heart assist device: Secondary | ICD-10-CM | POA: Diagnosis not present

## 2023-10-29 DIAGNOSIS — I4891 Unspecified atrial fibrillation: Secondary | ICD-10-CM | POA: Diagnosis not present

## 2023-10-29 DIAGNOSIS — Z951 Presence of aortocoronary bypass graft: Secondary | ICD-10-CM | POA: Diagnosis not present

## 2023-10-29 DIAGNOSIS — D649 Anemia, unspecified: Secondary | ICD-10-CM | POA: Diagnosis not present

## 2023-10-29 DIAGNOSIS — Z7901 Long term (current) use of anticoagulants: Secondary | ICD-10-CM | POA: Insufficient documentation

## 2023-10-29 DIAGNOSIS — I255 Ischemic cardiomyopathy: Secondary | ICD-10-CM | POA: Insufficient documentation

## 2023-10-29 DIAGNOSIS — I132 Hypertensive heart and chronic kidney disease with heart failure and with stage 5 chronic kidney disease, or end stage renal disease: Secondary | ICD-10-CM | POA: Diagnosis not present

## 2023-10-29 DIAGNOSIS — Z79899 Other long term (current) drug therapy: Secondary | ICD-10-CM | POA: Diagnosis not present

## 2023-10-29 DIAGNOSIS — B9561 Methicillin susceptible Staphylococcus aureus infection as the cause of diseases classified elsewhere: Secondary | ICD-10-CM | POA: Diagnosis not present

## 2023-10-29 DIAGNOSIS — Z992 Dependence on renal dialysis: Secondary | ICD-10-CM | POA: Insufficient documentation

## 2023-10-29 DIAGNOSIS — E1122 Type 2 diabetes mellitus with diabetic chronic kidney disease: Secondary | ICD-10-CM | POA: Insufficient documentation

## 2023-10-29 LAB — PROTIME-INR
INR: 2 — ABNORMAL HIGH (ref 0.8–1.2)
Prothrombin Time: 22.6 s — ABNORMAL HIGH (ref 11.4–15.2)

## 2023-10-29 LAB — COMPREHENSIVE METABOLIC PANEL
ALT: 17 U/L (ref 0–44)
AST: 31 U/L (ref 15–41)
Albumin: 4.1 g/dL (ref 3.5–5.0)
Alkaline Phosphatase: 76 U/L (ref 38–126)
Anion gap: 12 (ref 5–15)
BUN: 22 mg/dL (ref 8–23)
CO2: 30 mmol/L (ref 22–32)
Calcium: 8.4 mg/dL — ABNORMAL LOW (ref 8.9–10.3)
Chloride: 98 mmol/L (ref 98–111)
Creatinine, Ser: 5.33 mg/dL — ABNORMAL HIGH (ref 0.61–1.24)
GFR, Estimated: 11 mL/min — ABNORMAL LOW (ref >60)
Glucose, Bld: 132 mg/dL — ABNORMAL HIGH (ref 70–99)
Potassium: 3.6 mmol/L (ref 3.5–5.1)
Sodium: 140 mmol/L (ref 135–145)
Total Bilirubin: 0.8 mg/dL (ref 0.0–1.2)
Total Protein: 7.8 g/dL (ref 6.5–8.1)

## 2023-10-29 LAB — MAGNESIUM: Magnesium: 2.1 mg/dL (ref 1.7–2.4)

## 2023-10-29 LAB — CBC
HCT: 36.5 % — ABNORMAL LOW (ref 39.0–52.0)
Hemoglobin: 11.3 g/dL — ABNORMAL LOW (ref 13.0–17.0)
MCH: 27.9 pg (ref 26.0–34.0)
MCHC: 31 g/dL (ref 30.0–36.0)
MCV: 90.1 fL (ref 80.0–100.0)
Platelets: 115 10*3/uL — ABNORMAL LOW (ref 150–400)
RBC: 4.05 MIL/uL — ABNORMAL LOW (ref 4.22–5.81)
RDW: 17.6 % — ABNORMAL HIGH (ref 11.5–15.5)
WBC: 7.8 10*3/uL (ref 4.0–10.5)
nRBC: 0 % (ref 0.0–0.2)

## 2023-10-29 LAB — PREALBUMIN: Prealbumin: 33 mg/dL (ref 18–38)

## 2023-10-29 LAB — POCT INR: INR: 2.3 (ref 2.0–3.0)

## 2023-10-29 LAB — LACTATE DEHYDROGENASE: LDH: 200 U/L — ABNORMAL HIGH (ref 98–192)

## 2023-10-29 LAB — TSH: TSH: 2.001 u[IU]/mL (ref 0.350–4.500)

## 2023-10-29 NOTE — Progress Notes (Addendum)
 Patient presents for 2 mo follow up. Reports no problems with VAD equipment or concerns with drive line.  Patient arrived via wheelchair today but was able to step on the scale for his weight. Pt denies dizziness, falls, heart failure symptoms, or signs of bleeding. Complains of intermittent lightheadedness and fatigue on dialysis days. Still experiencing intermittent nausea on HD days, but this has improved. He uses PRN Zofran occasionally.   Pt has not had anymore ICD shocks since his last visit and is taking his Amiodarone 200 BID and Mexiletine 150 mg BID.   Patient reports he continues M/W/F dialysis. Pt confirms he takes Midodrine TID on HD days only. He tells me that if he feels lightheaded on non HD days at times and if this happens he takes a Midodrine, but this is rare.   BP elevated today. Discussed with Dr Marcus Johnson. Minimal PI events, flows appropriate on VAD. Dr. Shirlee Johnson suggests only taking 20mg  of Midodrine prior to HD.  Pt on Keflex 500 mg BID (dosed for HD schedule). Marcus Johnson reports they are changing dressing weekly. Currently wearing 2 anchors. Reports he wears binder at home, but does not like to wear during dialysis.   Pt scheduled for cataract removal at [facility] 12/19/23.   Dry weight at HD: 82.1 kg. Pt states they typically pull 2-4 L at each treatment. Discussed with Dr. Shirlee Johnson. Will reach out to [facility] to ask to keep dry weight closer to 83 kg.   Vital Signs:  Doppler Pressure: 110 Automatic BP: 116/98 (104) HR: 88 NSR O2: 99% RA   Weight: 186.2 lbs w/ eqt Last wt: 195 w/ eqt   VAD Indication: Destination Therapy due to CKD Stage 3b   LVAD assessment:  HM III: Speed: 5700 rpms Flow: 5.1 Power: 4.5 w    PI: 3.4 Alarms: none Events: 0 - 5  Fixed speed:  5700 Low speed limit:  5400  Primary controller: Back up battery due for replacement in 22 months Secondary controller: Back up battery due for replacement in 30 months  I reviewed the LVAD  parameters from today and compared the results to the patient's prior recorded data. LVAD interrogation was NEGATIVE for significant power changes, NEGATIVE for clinical alarms and STABLE for PI events/speed drops. No programming changes were made and pump is functioning within specified parameters. Pt is performing daily controller and system monitor self tests along with completing weekly and monthly maintenance for LVAD equipment.   LVAD equipment check completed and is in good working order. Back-up equipment present and back up battery charged at this appointment.   Annual Equipment Maintenance on UBC/PM was performed 10/29/22.  Patient unable to complete and neurocognitive portion.  [id number] Cardiomyopathy, EQ-5D-3L and post-VAD QOL completed by the patient with assistance from the Coordinator/caregiver.  Pt given and charger for 2 sets of batteries at this visit.  Patient goals: To walk more this year   [place] Cardiomyopathy Questionnaire     10/29/2023    9:44 AM 04/09/2023   11:07 AM 10/23/2022    2:32 PM  KCCQ-12  1 a. Ability to shower/bathe Extremely limited Extremely limited Extremely limited  1 b. Ability to walk 1 block Not at all limited Not at all limited Extremely limited  1 c. Ability to hurry/jog Extremely limited Extremely limited Extremely limited  2. Edema feet/ankles/legs Never over the past 2 weeks Never over the past 2 weeks Never over the past 2 weeks  3. Limited by fatigue Never  over the past 2 weeks At least once a day Never over the past 2 weeks  4. Limited by dyspnea Never over the past 2 weeks Never over the past 2 weeks Never over the past 2 weeks  5. Sitting up / on 3+ pillows Never over the past 2 weeks Never over the past 2 weeks Never over the past 2 weeks  6. Limited enjoyment of life Not limited at all Not limited at all Not limited at all  7. Rest of life w/ symptoms Somewhat satisfied Somewhat satisfied Completely satisfied  8 a.  Participation in hobbies Slightly limited Severely limited N/A, did not do for other reasons  8 b. Participation in chores N/A, did not do for other reasons Slightly limited N/A, did not do for other reasons  8 c. Visiting family/friends N/A, did not do for other reasons Slightly limited Limited quite a bit     Exit Site Care: Existing VAD dressing removed and site care performed using sterile technique. Currently being maintained by Marcus Johnson MWF. Drive line exit site cleaned with Chlora prep applicators x 2, allowed to dry, and Sorbaview dressing with Silverlon patch applied. Exit site healed and incorporated, the velour is fully implanted at exit site. No redness, tenderness, drainage, foul odor or rash noted. Drive line anchor re-applied. Pt denies fever or chills. Pt given 28 dressings, alcohol swabs and tape for home use.     Device: Medtronic single Therapies: VF: 240 VT: 200 Slow VT: 143 Pacing: VVI 40 Last check: 07/09/23   BP & Labs:  Doppler 110 - Doppler is reflecting modified systolic  Hgb 11.1- No S/S of bleeding. Specifically denies melena/BRBPR or nosebleeds.   LDH 198- established baseline of 160 - 180. Denies tea-colored urine. No power elevations noted on interrogation.   Plan: No medication changes Will call [facility] to adjust dry weight Coumadin dosing per Marcus Johnson PharmD Return to clinic in 2 months.   Marcus Davies RN,BSN VAD Coordinator  Office: [phone]  24/7 Pager: [phone]   Follow up for Heart Failure/LVAD:  Chief complaint: LVAD followup  Marcus Johnson. is a 63 y.o. male who has a h/o DM2, CAD s/p CABG 2002, systolic HF due to ischemic cardiomyopathy with EF 20-25% (echo 12/15), DM2 and CKD. He is s/p Medtronic single chamber ICD.   Admitted in 12/15 due to ADHF. Required short course milrinone for diuresis. Diuresed 30 pounds.    CPX 2/16 showed severely reduced functional capacity.  There was a significant disconnect between  his symptoms and the CPX.  RHC in 6/16 showed fairly normal filling pressures and low but not markedly low cardiac output.    CPX (4/19) was submaximal but suggested severe limitation from HF.    He was admitted in 6/19 with marked volume overload and dyspnea.  Echo in 6/19 showed EF 20% with severe global hypokinesis.  LHC/RHC showed severe native vessel CAD with patent LIMA-D, SVG-OM, and SVG-PLV; cardiac index low.  He was started on milrinone and diuresed.  We discussed LVAD extensively in light of low output, creatinine that is trending up and concern for decompensation of RV over time. He was adamant that he did not want to pursue LVAD workup yet and did not want a referral for transplant evaluation. Milrinone was weaned off and he was discharged home.    Admitted 08/01/18-08/07/18 with volume overload and left foot wound. Diuresed 38 lbs with IV lasix and then transitioned back to torsemide 100 mg BID. ID was  consulted with concerns for left foot osteomyelitis noted on CT scan. He got IV ancef q8 hours x 42 days. [facility] provided teaching and supplies for home antibiotics. Course complicated by AKI. DC weight 200 lbs.    He went into atrial flutter and had DCCV to NSR in 1/20.    Echo in 4/21 showed EF 25% with apical akinesis and diffuse hypokinesis relatively preserved in lateral wall, mildly decreased RV systolic function, PASP 41 mmHg. CPX in 5/21 showed severe functional limitation suggestive of advanced HF. 5/21 Zio patch showed short atrial flutter runs, 3.8% PVCs.    He developed COVID-19 infection in 1/22.  Symptoms were predominantly GI.    He developed progressive worsening of CHF in early 2022 and was admitted in 3/22 with low output HF.  Impella 5.5 was placed as bridge to Heartmate III LVAD placed in 10/13/20.  He developed post-op renal failure and ended up on dialysis.  He was markedly deconditioned post-op and went to CIR.    Still has pain and minimal function in his right hand.   Nerve conduction study showed a chronic sensorimotor neuropathy in the right arm.    Ramp echo in 3/23 showed severe LV dysfunction with mild RV enlargement and mild dysfunction, mild-moderate AI.  Speed was increased to 5800 rpm.  The aortic valve still opened with every beat but only slightly. Flow increased from 4.1 to 5.3 L/min and PI remained stable.   Patient was admitted in 4/23 with falls and lightheadedness. He fractured his left foot when falling and initially was unable to walk.  He was thought to be hypotensive due to over-diuresis and dry weight was increased.  Midodrine on HD days was also increased.  He was treated for a driveline infection with vancomycin then doxycycline, now off.   ICD shock for VT in 7/23, not started on amiodarone given only 1 episode.   Echo in 3/24 showed EF 20-25% with midline IV septum, mild RV dysfunction with severe RVE, moderate AI.   In 5/24, patient had VT treated with ATP.      Patient was shocked by his ICD for VT on 04/17/23, 04/20/23, and 04/21/23.  I had him increase his amiodarone to 200 mg bid x 2 wks then back to 200 mg daily.  After discussion with Dr. Graciela Husbands, I started him on mexiletine as well.    Ramp echo was in 9/24.  Starting speed 5800 rpm with moderate AI, slight aortic valve opening each beat, moderate RV enlargement/moderate RV dysfunction, septum shifted slightly to the left.  Speed decreased to 5700 rpm with moderate AI, aortic valve opening each beat, midline septum with flow fairly stable at 5 L/min.  I left speed at 5700 rpm.   Patient returns for followup of LVAD.  MAP is elevated today.  He does not take midodrine on non-HD days.  Nephrology has recently decreased his dry weight.  He feels like he is dehydrated after HD.  Some lightheadedness, no falls.  No significant dyspnea with usual activities.  He has a few PI events daily on device interrogation, unchanged from prior.   Labs (5/22): LDH 174, hgb 10.6 Labs (7/22): LDH  164 Labs (8/22): hgb 11.4 Labs (10/22): hgb 12 Labs (11/22): hgb 12.2 Labs (4/23): LDL 163, hgb 10.4 Labs (6/23): hgb 12.2, LDH 284 Labs (7/23): LFTs normal, hgb 11.3, LDH 235 Labs (9/23): hgb 10.2 Labs (1/24): hgb 10.6, LDH 279 Labs (3/24): LDH 222, hgb 11.2 Labs (6/24): LDH 219, hgb 11.3,  TSH normal Labs (9/24): TSH normal, hgb 12, LFTs normal, LDH 215 Labs (1/25): LFTs normal, TSH normal  Past Medical History:  Diagnosis Date   AICD (automatic cardioverter/defibrillator) present    Single-chamber  implantable cardiac defibrillator - Medtronic   Atrial fibrillation (HCC)    Cataract    Mixed form OU   CHF (congestive heart failure) (HCC)    Chronic kidney disease    Chronic kidney disease (CKD), stage III (moderate) (HCC)    Chronic systolic heart failure (HCC)    a. Echo 5/13: Mild LVE, mild LVH, EF 10%, anteroseptal, lateral, apical AK, mild MR, mild LAE, moderately reduced RVSF, mild RAE, PASP 60;  b. 07/2014 Echo: EF 20-2%, diff HK, AKI of antsep/apical/mid-apicalinferior, mod reduced RV.   Coronary artery disease    a. s/p CABG 2002 b. LHC 5/13:  dLM 80%, LAD subtotally occluded, pCFX occluded, pRCA 50%, mid? Occlusion with high take off of the PDA with 70% multiple lesions-not bypassed and supplies collaterals to LAD, LIMA-IM/ramus ok, S-OM ok, S-PLA branches ok. Medical therapy was recommended   COVID    Diabetic retinopathy (HCC)    NPDR OD, PDR OS   Dyspnea    Gout    "on daily RX" (01/08/2018)   Hypertension    Hypertensive retinopathy    OU   Ischemic cardiomyopathy    a. Echo 5/13: Mild LVE, mild LVH, EF 10%, anteroseptal, lateral, apical AK, mild MR, mild LAE, moderately reduced RVSF, mild RAE, PASP 60;  b. 01/2012 s/p MDT [id number] Protecta XT VR AICD;  c. 07/2014 Echo: EF 20-2%, diff HK, AKI of antsep/apical/mid-apicalinferior, mod reduced RV.   MRSA (methicillin resistant Staphylococcus aureus)    Status post right foot plantar deep infection with MRSA status post   I&D 10/2008   Myocardial infarction [facility])    "was told I'd had several before heart OR in 2002" (01/08/2018)   Noncompliance    Peripheral neuropathy    Retinopathy, diabetic, background (HCC)    Syncope    Type II diabetes mellitus (HCC)    requiring insulin    Vitreous hemorrhage, left (HCC)    and proliferative diabetic retinopathy    Current Outpatient Medications  Medication Sig Dispense Refill   acetaminophen (TYLENOL) 325 MG tablet Take by mouth.     allopurinol (ZYLOPRIM) 300 MG tablet Take by mouth.     amiodarone (PACERONE) 200 MG tablet Take 1 tablet (200 mg total) by mouth 2 (two) times daily. Take 1 tablet (200 mg) by mouth twice daily for 2 weeks starting 04/23/23, then decrease to 1 tablet (200 mg) daily 05/07/23. (Patient taking differently: Take 200 mg by mouth daily.) 90 tablet 3   BD PEN NEEDLE NANO 2ND GEN 32G X 4 MM MISC USE TO INJECT INSULIN DAILY 100 each 0   Blood Glucose Monitoring Suppl (ONE TOUCH ULTRA 2) w/Device KIT 1 Device by Does not apply route 3 (three) times daily. 1 kit 0   cephALEXin (KEFLEX) 500 MG capsule Take 1 capsule (500 mg total) by mouth 2 (two) times daily. Take evening dose after dialysis on HD days. 60 capsule 2   gabapentin (NEURONTIN) 100 MG capsule TAKE 1 CAPSULE ( 100 MG) IN THE MORNING AND 2 CAPSULES ( 200 MG) AT BEDTIME 90 capsule 3   glucose blood (ACCU-CHEK GUIDE) test strip USE AS INSTRUCTED 3 TIMES DAILY 100 strip 1   insulin glargine (LANTUS SOLOSTAR) 100 UNIT/ML Solostar Pen Inject 60 Units into the skin at bedtime. (Patient  taking differently: Inject 60 Units into the skin daily as needed.) 30 mL 11   mexiletine (MEXITIL) 150 MG capsule Take 1 capsule (150 mg total) by mouth 2 (two) times daily. 180 capsule 3   midodrine (PROAMATINE) 10 MG tablet Take 2 tablets (20 mg total) by mouth 3 (three) times daily. Three times a day on dialysis days; NO midodrine on NON dialysis days 160 tablet 11   multivitamin (RENA-VIT) TABS tablet TAKE 1  TABLET BY MOUTH EVERYDAY AT BEDTIME 90 tablet 3   naphazoline-glycerin (CLEAR EYES REDNESS) 0.012-0.25 % SOLN Place 1-2 drops into the right eye 4 (four) times daily as needed for eye irritation.     ondansetron (ZOFRAN) 4 MG tablet Take 1 tablet (4 mg total) by mouth every 8 (eight) hours as needed for nausea or vomiting. 30 tablet 3   OneTouch UltraSoft 2 Lancets MISC 1 each by Does not apply route 3 (three) times daily before meals. 100 each 1   pantoprazole (PROTONIX) 40 MG tablet TAKE 1 TABLET BY MOUTH EVERY DAY 90 tablet 3   polyethylene glycol powder (GLYCOLAX/MIRALAX) 17 GM/SCOOP powder      rosuvastatin (CRESTOR) 10 MG tablet Take 1 tablet (10 mg total) by mouth daily. 90 tablet 3   sevelamer (RENAGEL) 800 MG tablet Take 800 mg by mouth 3 (three) times daily.     traMADol (ULTRAM) 50 MG tablet TAKE 1 TABLET BY MOUTH EVERY 12 HOURS AS NEEDED 30 tablet 3   warfarin (COUMADIN) 3 MG tablet TAKE 4.5 MG (1.5 TABS) DAILY OR AS DIRECTED BY HF CLINIC 150 tablet 3   No current facility-administered medications for this encounter.    Meclizine hcl and Ivabradine  REVIEW OF SYSTEMS: All systems negative except as listed in HPI, PMH and Problem list.   LVAD INTERROGATION:  Please see LVAD nurse's note above, I reviewed findings.   Physical Exam: BP (!) 110/0   Pulse 88   Wt 84.5 kg (186 lb 3.2 oz)   SpO2 99%   BMI 26.72 kg/m  MAP 104 General: Well appearing this am. NAD.  HEENT: Normal. Neck: Supple, JVP 8-9 cm. Carotids OK.  Cardiac:  Mechanical heart sounds with LVAD hum present.  Lungs:  CTAB, normal effort.  Abdomen:  NT, ND, no HSM. No bruits or masses. +BS  LVAD exit site: Well-healed and incorporated. Dressing dry and intact. No erythema or drainage. Stabilization device present and accurately applied. Driveline dressing changed daily per sterile technique. Extremities:  Warm and dry. No cyanosis, clubbing, rash. Trace ankle edema.  Neuro:  Alert & oriented x 3. Cranial  nerves grossly intact. Moves all 4 extremities w/o difficulty. Affect pleasant    ASSESSMENT AND PLAN:  1. Chronic systolic CHF:  Ischemic cardiomyopathy. Has Medtronic ICD. Low output HF, admitted and Impella 5.5 placed 10/04/20.  HM3 LVAD was placed on 10/13/20. Developed post-op renal failure requiring eventual hemodialysis.  Ramp echo in 3/23, speed increased to 5800 rpm under echo guidance with increase in flow and stable PI.  Echo in 3/24 showed EF 20-25% with midline IV septum, mild RV dysfunction with severe RVE, moderate AI. Ramp echo in 9/24 with decrease in speed in setting of VT episodes.  MAP elevated today but also low at times, needs midodrine pre-HD. No further VT.  Minimal volume overload, NYHA class II.  LVAD interrogation shows occasional PI events, no low flows/suction.  - Continue midodrine on HD days and reminded him not to take midodrine on the non-HD days.    -  Careful with lowering his dry weight at HD, he is very sensitive to volume depletion with lightheadedness.  Would recommend increasing his dry weight slightly.  2. LVAD:  VAD interrogated personally.  Parameters stable with a few PI events on HD days. No low flow events or suction.  - He is now off ASA.  - Continue warfarin with INR goal 2-2.5.   3. CAD s/p CABG 2002:  Last cath in 6/19 with patent grafts. No chest pain.  - Continue Crestor  4. ESRD: Currently dialyzing via tunneled catheter. No low flow events. Tolerating HD well.  - Continue HD M-W-F  - With continuous flow LVAD, think AV fistula would be unlikely to mature.   5. Atrial flutter/fibrillation: s/p DC-CV 08/21/18.  Has occasional episodes of AF but not prolonged.  - Continue amiodarone, check LFTs and TSH today.  He will need regular eye exam.  6. 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 This has been improving though not completely normal.   7. DM: Per primary care.  8. Anemia: CBC  today, no BRBPR/melena.  9. Suspected Gastroparesis: He is no longer taking Reglan.  10. VT: Episode in 7/23 with successful ICD discharge.  No definite trigger, he did not actually feel the VT. VT again in 5/24 treated with ATP, amiodarone started.  Episodes of VT 9/11, 9/14, and 04/21/23.  Ramp echo in 9/24 with decreased speed.  No further VT.  - Continue amiodarone 200 mg daily.  - Continue mexiletine 150 mg bid.  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 Driveline site looks good today.   Followup 2 months.   I spent 41 minutes reviewing records, interviewing/examining patient, and managing orders.    Marca Ancona 10/29/2023

## 2023-10-30 DIAGNOSIS — N2581 Secondary hyperparathyroidism of renal origin: Secondary | ICD-10-CM | POA: Diagnosis not present

## 2023-10-30 DIAGNOSIS — Z992 Dependence on renal dialysis: Secondary | ICD-10-CM | POA: Diagnosis not present

## 2023-10-30 DIAGNOSIS — N186 End stage renal disease: Secondary | ICD-10-CM | POA: Diagnosis not present

## 2023-10-30 LAB — T4: T4, Total: 12.6 ug/dL — ABNORMAL HIGH (ref 4.5–12.0)

## 2023-10-30 LAB — T3: T3, Total: 42 ng/dL — ABNORMAL LOW (ref 71–180)

## 2023-10-31 ENCOUNTER — Encounter: Payer: Self-pay | Admitting: Podiatry

## 2023-10-31 ENCOUNTER — Ambulatory Visit (INDEPENDENT_AMBULATORY_CARE_PROVIDER_SITE_OTHER): Payer: Medicare HMO | Admitting: Podiatry

## 2023-10-31 ENCOUNTER — Ambulatory Visit: Payer: Medicare HMO | Admitting: Podiatry

## 2023-10-31 DIAGNOSIS — B351 Tinea unguium: Secondary | ICD-10-CM

## 2023-10-31 DIAGNOSIS — M79675 Pain in left toe(s): Secondary | ICD-10-CM

## 2023-10-31 DIAGNOSIS — M79674 Pain in right toe(s): Secondary | ICD-10-CM

## 2023-10-31 DIAGNOSIS — Z992 Dependence on renal dialysis: Secondary | ICD-10-CM

## 2023-10-31 DIAGNOSIS — Z794 Long term (current) use of insulin: Secondary | ICD-10-CM

## 2023-10-31 DIAGNOSIS — N186 End stage renal disease: Secondary | ICD-10-CM

## 2023-10-31 DIAGNOSIS — E1122 Type 2 diabetes mellitus with diabetic chronic kidney disease: Secondary | ICD-10-CM | POA: Diagnosis not present

## 2023-10-31 NOTE — Progress Notes (Signed)
 This patient presents  to my office for at risk foot care.  This patient requires this care by a professional since this patient will be at risk due to having  CKD, coagulation defect and DM.  This patient is unable to cut nails himself since the patient cannot reach his nails.These nails are painful walking and wearing shoes. Marland Kitchen  He presents to the office with his brother.  This patient presents for at risk foot care today.  General Appearance  Alert, conversant and in no acute stress.  Vascular  Dorsalis pedis and posterior tibial  pulses are absent  bilaterally.  Capillary return is within normal limits  bilaterally. Temperature is within normal limits  bilaterally.  Neurologic  Senn-Weinstein monofilament wire test absent  bilaterally. Muscle power within normal limits bilaterally.  Nails Thick disfigured discolored nails with subungual debris  from hallux to fifth toes bilaterally. No evidence of bacterial infection or drainage bilaterally.  Orthopedic  No limitations of motion  feet .  No crepitus or effusions noted.  No bony pathology or digital deformities noted.  No  STJ ROM left foot.  Skin  normotropic skin with no porokeratosis noted bilaterally.  No signs of infections or ulcers noted.   Callus lateral aspect fifth metabase left foot asymptomatic.   Onychomycosis  Pain in right toes  Pain in left toes  Consent was obtained for treatment procedures.   Mechanical debridement of nails 1-5  bilaterally performed with a nail nipper.  Filed with dremel without incident.   Helane Gunther DPM

## 2023-11-01 ENCOUNTER — Ambulatory Visit (HOSPITAL_COMMUNITY): Payer: Self-pay | Admitting: Pharmacist

## 2023-11-01 ENCOUNTER — Telehealth (HOSPITAL_COMMUNITY): Payer: Self-pay | Admitting: Unknown Physician Specialty

## 2023-11-01 DIAGNOSIS — Z7901 Long term (current) use of anticoagulants: Secondary | ICD-10-CM

## 2023-11-01 DIAGNOSIS — N2581 Secondary hyperparathyroidism of renal origin: Secondary | ICD-10-CM | POA: Diagnosis not present

## 2023-11-01 DIAGNOSIS — N186 End stage renal disease: Secondary | ICD-10-CM | POA: Diagnosis not present

## 2023-11-01 DIAGNOSIS — Z95811 Presence of heart assist device: Secondary | ICD-10-CM

## 2023-11-01 DIAGNOSIS — Z992 Dependence on renal dialysis: Secondary | ICD-10-CM | POA: Diagnosis not present

## 2023-11-01 LAB — POCT INR: INR: 2 (ref 2.0–3.0)

## 2023-11-01 NOTE — Telephone Encounter (Addendum)
 Received page from dialysis center this morning @ 0805 stating the pt c/o blood in his urine today. Pt states that he has a large amount of blood in his urine. He tells me that he urinates about 2-3x a day. Pt denies any pain, burning or frequency. Pt states that he held his coumadin last night. Pt was instructed to recheck his INR when he gets home from dialysis today.  Received page from pt at 1247 stating that when he got home from dialysis he had to urinate and he again had a large amount of blood in his urine. Pt states that his INR is 2.0  D/w Dr Shirlee Latch and Posey Boyer D, will refer pt to [facility] per [facility] as this is the second time he has c/o this issue. See Lauren's note for coumadin instructions.  Will have pt come to clinic after dailysis on Monday for cbc.   Carlton Adam RN, BSN VAD Coordinator 24/7 Pager [phone]

## 2023-11-04 ENCOUNTER — Ambulatory Visit (HOSPITAL_COMMUNITY)
Admission: RE | Admit: 2023-11-04 | Discharge: 2023-11-04 | Disposition: A | Discharge status: Home / Self Care | Source: Ambulatory Visit | Attending: Cardiology | Admitting: Cardiology

## 2023-11-04 DIAGNOSIS — Z992 Dependence on renal dialysis: Secondary | ICD-10-CM | POA: Diagnosis not present

## 2023-11-04 DIAGNOSIS — Z4509 Encounter for adjustment and management of other cardiac device: Secondary | ICD-10-CM | POA: Insufficient documentation

## 2023-11-04 DIAGNOSIS — N2581 Secondary hyperparathyroidism of renal origin: Secondary | ICD-10-CM | POA: Diagnosis not present

## 2023-11-04 DIAGNOSIS — Z95811 Presence of heart assist device: Secondary | ICD-10-CM | POA: Insufficient documentation

## 2023-11-04 DIAGNOSIS — Z7901 Long term (current) use of anticoagulants: Secondary | ICD-10-CM | POA: Insufficient documentation

## 2023-11-04 DIAGNOSIS — I509 Heart failure, unspecified: Secondary | ICD-10-CM | POA: Diagnosis not present

## 2023-11-04 DIAGNOSIS — N186 End stage renal disease: Secondary | ICD-10-CM | POA: Diagnosis not present

## 2023-11-04 LAB — CBC
HCT: 34.4 % — ABNORMAL LOW (ref 39.0–52.0)
Hemoglobin: 10.8 g/dL — ABNORMAL LOW (ref 13.0–17.0)
MCH: 28 pg (ref 26.0–34.0)
MCHC: 31.4 g/dL (ref 30.0–36.0)
MCV: 89.1 fL (ref 80.0–100.0)
Platelets: 109 10*3/uL — ABNORMAL LOW (ref 150–400)
RBC: 3.86 MIL/uL — ABNORMAL LOW (ref 4.22–5.81)
RDW: 18.2 % — ABNORMAL HIGH (ref 11.5–15.5)
WBC: 8 10*3/uL (ref 4.0–10.5)
nRBC: 0.3 % — ABNORMAL HIGH (ref 0.0–0.2)

## 2023-11-05 ENCOUNTER — Ambulatory Visit (HOSPITAL_COMMUNITY): Payer: Self-pay | Admitting: Pharmacist

## 2023-11-05 ENCOUNTER — Encounter (INDEPENDENT_AMBULATORY_CARE_PROVIDER_SITE_OTHER): Admitting: Ophthalmology

## 2023-11-05 ENCOUNTER — Encounter (INDEPENDENT_AMBULATORY_CARE_PROVIDER_SITE_OTHER): Payer: Self-pay

## 2023-11-05 DIAGNOSIS — H4312 Vitreous hemorrhage, left eye: Secondary | ICD-10-CM

## 2023-11-05 DIAGNOSIS — Z7901 Long term (current) use of anticoagulants: Secondary | ICD-10-CM | POA: Diagnosis not present

## 2023-11-05 DIAGNOSIS — H25813 Combined forms of age-related cataract, bilateral: Secondary | ICD-10-CM

## 2023-11-05 DIAGNOSIS — Z794 Long term (current) use of insulin: Secondary | ICD-10-CM

## 2023-11-05 DIAGNOSIS — E113513 Type 2 diabetes mellitus with proliferative diabetic retinopathy with macular edema, bilateral: Secondary | ICD-10-CM

## 2023-11-05 DIAGNOSIS — I10 Essential (primary) hypertension: Secondary | ICD-10-CM

## 2023-11-05 DIAGNOSIS — H35033 Hypertensive retinopathy, bilateral: Secondary | ICD-10-CM

## 2023-11-05 LAB — POCT INR: INR: 1.7 — AB (ref 2.0–3.0)

## 2023-11-06 DIAGNOSIS — N2581 Secondary hyperparathyroidism of renal origin: Secondary | ICD-10-CM | POA: Diagnosis not present

## 2023-11-06 DIAGNOSIS — N186 End stage renal disease: Secondary | ICD-10-CM | POA: Diagnosis not present

## 2023-11-06 DIAGNOSIS — Z992 Dependence on renal dialysis: Secondary | ICD-10-CM | POA: Diagnosis not present
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
## 2023-11-08 DIAGNOSIS — N2581 Secondary hyperparathyroidism of renal origin: Secondary | ICD-10-CM | POA: Diagnosis not present

## 2023-11-08 DIAGNOSIS — Z992 Dependence on renal dialysis: Secondary | ICD-10-CM | POA: Diagnosis not present

## 2023-11-08 DIAGNOSIS — N186 End stage renal disease: Secondary | ICD-10-CM | POA: Diagnosis not present

## 2023-11-11 DIAGNOSIS — N186 End stage renal disease: Secondary | ICD-10-CM | POA: Diagnosis not present

## 2023-11-11 DIAGNOSIS — N2581 Secondary hyperparathyroidism of renal origin: Secondary | ICD-10-CM | POA: Diagnosis not present

## 2023-11-11 DIAGNOSIS — Z992 Dependence on renal dialysis: Secondary | ICD-10-CM | POA: Diagnosis not present

## 2023-11-12 ENCOUNTER — Ambulatory Visit (HOSPITAL_COMMUNITY): Payer: Self-pay | Admitting: Pharmacist

## 2023-11-12 ENCOUNTER — Ambulatory Visit (INDEPENDENT_AMBULATORY_CARE_PROVIDER_SITE_OTHER): Admitting: Ophthalmology

## 2023-11-12 ENCOUNTER — Encounter (INDEPENDENT_AMBULATORY_CARE_PROVIDER_SITE_OTHER): Payer: Self-pay | Admitting: Ophthalmology

## 2023-11-12 DIAGNOSIS — I10 Essential (primary) hypertension: Secondary | ICD-10-CM

## 2023-11-12 DIAGNOSIS — H25813 Combined forms of age-related cataract, bilateral: Secondary | ICD-10-CM | POA: Diagnosis not present

## 2023-11-12 DIAGNOSIS — H4312 Vitreous hemorrhage, left eye: Secondary | ICD-10-CM | POA: Diagnosis not present

## 2023-11-12 DIAGNOSIS — E113513 Type 2 diabetes mellitus with proliferative diabetic retinopathy with macular edema, bilateral: Secondary | ICD-10-CM

## 2023-11-12 DIAGNOSIS — H35033 Hypertensive retinopathy, bilateral: Secondary | ICD-10-CM | POA: Diagnosis not present

## 2023-11-12 DIAGNOSIS — Z794 Long term (current) use of insulin: Secondary | ICD-10-CM | POA: Diagnosis not present

## 2023-11-12 LAB — POCT INR: INR: 1.9 — AB (ref 2.0–3.0)

## 2023-11-12 MED ORDER — BEVACIZUMAB CHEMO INJECTION 1.25MG/0.05ML SYRINGE FOR KALEIDOSCOPE
1.2500 mg | INTRAVITREAL | Status: AC | PRN
  Administered 2023-11-12: 1.25 mg via INTRAVITREAL

## 2023-11-13 DIAGNOSIS — N186 End stage renal disease: Secondary | ICD-10-CM | POA: Diagnosis not present

## 2023-11-13 DIAGNOSIS — N2581 Secondary hyperparathyroidism of renal origin: Secondary | ICD-10-CM | POA: Diagnosis not present

## 2023-11-13 DIAGNOSIS — Z992 Dependence on renal dialysis: Secondary | ICD-10-CM | POA: Diagnosis not present

## 2023-11-13 NOTE — Addendum Note (Signed)
 Addended by: Geralyn Flash D on: 11/13/2023 02:00 PM   Modules accepted: Orders

## 2023-11-13 NOTE — Progress Notes (Signed)
 Remote ICD transmission.

## 2023-11-15 DIAGNOSIS — N186 End stage renal disease: Secondary | ICD-10-CM | POA: Diagnosis not present

## 2023-11-15 DIAGNOSIS — N2581 Secondary hyperparathyroidism of renal origin: Secondary | ICD-10-CM | POA: Diagnosis not present

## 2023-11-15 DIAGNOSIS — Z992 Dependence on renal dialysis: Secondary | ICD-10-CM | POA: Diagnosis not present

## 2023-11-18 ENCOUNTER — Encounter: Payer: Self-pay | Admitting: Internal Medicine

## 2023-11-18 DIAGNOSIS — N2581 Secondary hyperparathyroidism of renal origin: Secondary | ICD-10-CM | POA: Diagnosis not present

## 2023-11-18 DIAGNOSIS — N186 End stage renal disease: Secondary | ICD-10-CM | POA: Diagnosis not present

## 2023-11-18 DIAGNOSIS — Z992 Dependence on renal dialysis: Secondary | ICD-10-CM | POA: Diagnosis not present

## 2023-11-19 ENCOUNTER — Ambulatory Visit (HOSPITAL_COMMUNITY): Payer: Self-pay | Admitting: Pharmacist

## 2023-11-19 LAB — POCT INR: INR: 2 (ref 2.0–3.0)

## 2023-11-20 DIAGNOSIS — Z992 Dependence on renal dialysis: Secondary | ICD-10-CM | POA: Diagnosis not present

## 2023-11-20 DIAGNOSIS — N2581 Secondary hyperparathyroidism of renal origin: Secondary | ICD-10-CM | POA: Diagnosis not present

## 2023-11-20 DIAGNOSIS — N186 End stage renal disease: Secondary | ICD-10-CM | POA: Diagnosis not present

## 2023-11-21 ENCOUNTER — Other Ambulatory Visit (HOSPITAL_COMMUNITY): Payer: Self-pay | Admitting: Cardiology

## 2023-11-21 DIAGNOSIS — G629 Polyneuropathy, unspecified: Secondary | ICD-10-CM

## 2023-11-22 DIAGNOSIS — N186 End stage renal disease: Secondary | ICD-10-CM | POA: Diagnosis not present

## 2023-11-22 DIAGNOSIS — N2581 Secondary hyperparathyroidism of renal origin: Secondary | ICD-10-CM | POA: Diagnosis not present

## 2023-11-22 DIAGNOSIS — Z992 Dependence on renal dialysis: Secondary | ICD-10-CM | POA: Diagnosis not present

## 2023-11-25 DIAGNOSIS — Z992 Dependence on renal dialysis: Secondary | ICD-10-CM | POA: Diagnosis not present

## 2023-11-25 DIAGNOSIS — N2581 Secondary hyperparathyroidism of renal origin: Secondary | ICD-10-CM | POA: Diagnosis not present

## 2023-11-25 DIAGNOSIS — N186 End stage renal disease: Secondary | ICD-10-CM | POA: Diagnosis not present

## 2023-11-26 ENCOUNTER — Ambulatory Visit (HOSPITAL_COMMUNITY): Payer: Self-pay | Admitting: Pharmacist

## 2023-11-26 LAB — POCT INR: INR: 2 (ref 2.0–3.0)

## 2023-11-27 DIAGNOSIS — N2581 Secondary hyperparathyroidism of renal origin: Secondary | ICD-10-CM | POA: Diagnosis not present

## 2023-11-27 DIAGNOSIS — Z992 Dependence on renal dialysis: Secondary | ICD-10-CM | POA: Diagnosis not present

## 2023-11-27 DIAGNOSIS — N186 End stage renal disease: Secondary | ICD-10-CM | POA: Diagnosis not present

## 2023-11-29 DIAGNOSIS — N186 End stage renal disease: Secondary | ICD-10-CM | POA: Diagnosis not present

## 2023-11-29 DIAGNOSIS — N2581 Secondary hyperparathyroidism of renal origin: Secondary | ICD-10-CM | POA: Diagnosis not present

## 2023-11-29 DIAGNOSIS — Z992 Dependence on renal dialysis: Secondary | ICD-10-CM | POA: Diagnosis not present

## 2023-12-02 DIAGNOSIS — N2581 Secondary hyperparathyroidism of renal origin: Secondary | ICD-10-CM | POA: Diagnosis not present

## 2023-12-02 DIAGNOSIS — Z992 Dependence on renal dialysis: Secondary | ICD-10-CM | POA: Diagnosis not present

## 2023-12-02 DIAGNOSIS — N186 End stage renal disease: Secondary | ICD-10-CM | POA: Diagnosis not present

## 2023-12-03 ENCOUNTER — Ambulatory Visit (HOSPITAL_COMMUNITY): Payer: Self-pay | Admitting: Pharmacist

## 2023-12-03 DIAGNOSIS — Z7901 Long term (current) use of anticoagulants: Secondary | ICD-10-CM | POA: Diagnosis not present

## 2023-12-03 LAB — POCT INR: INR: 2.1 (ref 2.0–3.0)

## 2023-12-04 DIAGNOSIS — N2581 Secondary hyperparathyroidism of renal origin: Secondary | ICD-10-CM | POA: Diagnosis not present

## 2023-12-04 DIAGNOSIS — I509 Heart failure, unspecified: Secondary | ICD-10-CM | POA: Diagnosis not present

## 2023-12-04 DIAGNOSIS — Z992 Dependence on renal dialysis: Secondary | ICD-10-CM | POA: Diagnosis not present

## 2023-12-04 DIAGNOSIS — N186 End stage renal disease: Secondary | ICD-10-CM | POA: Diagnosis not present
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
## 2023-12-06 DIAGNOSIS — Z992 Dependence on renal dialysis: Secondary | ICD-10-CM | POA: Diagnosis not present

## 2023-12-06 DIAGNOSIS — N2581 Secondary hyperparathyroidism of renal origin: Secondary | ICD-10-CM | POA: Diagnosis not present

## 2023-12-06 DIAGNOSIS — N186 End stage renal disease: Secondary | ICD-10-CM | POA: Diagnosis not present

## 2023-12-09 DIAGNOSIS — N186 End stage renal disease: Secondary | ICD-10-CM | POA: Diagnosis not present

## 2023-12-09 DIAGNOSIS — Z992 Dependence on renal dialysis: Secondary | ICD-10-CM | POA: Diagnosis not present

## 2023-12-09 DIAGNOSIS — N2581 Secondary hyperparathyroidism of renal origin: Secondary | ICD-10-CM | POA: Diagnosis not present

## 2023-12-10 ENCOUNTER — Ambulatory Visit (HOSPITAL_COMMUNITY): Payer: Self-pay | Admitting: Pharmacist

## 2023-12-10 LAB — POCT INR: INR: 1.6 — AB (ref 2.0–3.0)

## 2023-12-11 DIAGNOSIS — N2581 Secondary hyperparathyroidism of renal origin: Secondary | ICD-10-CM | POA: Diagnosis not present

## 2023-12-11 DIAGNOSIS — Z992 Dependence on renal dialysis: Secondary | ICD-10-CM | POA: Diagnosis not present

## 2023-12-11 DIAGNOSIS — N186 End stage renal disease: Secondary | ICD-10-CM | POA: Diagnosis not present

## 2023-12-13 DIAGNOSIS — Z992 Dependence on renal dialysis: Secondary | ICD-10-CM | POA: Diagnosis not present

## 2023-12-13 DIAGNOSIS — N2581 Secondary hyperparathyroidism of renal origin: Secondary | ICD-10-CM | POA: Diagnosis not present

## 2023-12-13 DIAGNOSIS — N186 End stage renal disease: Secondary | ICD-10-CM | POA: Diagnosis not present

## 2023-12-16 DIAGNOSIS — N2581 Secondary hyperparathyroidism of renal origin: Secondary | ICD-10-CM | POA: Diagnosis not present

## 2023-12-16 DIAGNOSIS — N186 End stage renal disease: Secondary | ICD-10-CM | POA: Diagnosis not present

## 2023-12-16 DIAGNOSIS — Z992 Dependence on renal dialysis: Secondary | ICD-10-CM | POA: Diagnosis not present

## 2023-12-17 ENCOUNTER — Ambulatory Visit (HOSPITAL_COMMUNITY): Payer: Self-pay | Admitting: Pharmacist

## 2023-12-17 ENCOUNTER — Encounter (INDEPENDENT_AMBULATORY_CARE_PROVIDER_SITE_OTHER): Payer: Self-pay | Admitting: Ophthalmology

## 2023-12-17 ENCOUNTER — Ambulatory Visit (INDEPENDENT_AMBULATORY_CARE_PROVIDER_SITE_OTHER): Admitting: Ophthalmology

## 2023-12-17 DIAGNOSIS — I10 Essential (primary) hypertension: Secondary | ICD-10-CM | POA: Diagnosis not present

## 2023-12-17 DIAGNOSIS — H25813 Combined forms of age-related cataract, bilateral: Secondary | ICD-10-CM

## 2023-12-17 DIAGNOSIS — H4312 Vitreous hemorrhage, left eye: Secondary | ICD-10-CM

## 2023-12-17 DIAGNOSIS — E113513 Type 2 diabetes mellitus with proliferative diabetic retinopathy with macular edema, bilateral: Secondary | ICD-10-CM

## 2023-12-17 DIAGNOSIS — H35033 Hypertensive retinopathy, bilateral: Secondary | ICD-10-CM

## 2023-12-17 DIAGNOSIS — Z794 Long term (current) use of insulin: Secondary | ICD-10-CM | POA: Diagnosis not present

## 2023-12-17 LAB — POCT INR: INR: 1.9 — AB (ref 2.0–3.0)

## 2023-12-17 MED ORDER — BEVACIZUMAB CHEMO INJECTION 1.25MG/0.05ML SYRINGE FOR KALEIDOSCOPE
1.2500 mg | INTRAVITREAL | Status: AC | PRN
  Administered 2023-12-17: 1.25 mg via INTRAVITREAL

## 2023-12-17 MED ORDER — BEVACIZUMAB CHEMO INJECTION 1.25MG/0.05ML SYRINGE FOR KALEIDOSCOPE
1.2500 mg | INTRAVITREAL | Status: AC | PRN
Start: 2023-12-17 — End: 2023-12-17
  Administered 2023-12-17: 1.25 mg via INTRAVITREAL

## 2023-12-18 DIAGNOSIS — N2581 Secondary hyperparathyroidism of renal origin: Secondary | ICD-10-CM | POA: Diagnosis not present

## 2023-12-18 DIAGNOSIS — N186 End stage renal disease: Secondary | ICD-10-CM | POA: Diagnosis not present

## 2023-12-18 DIAGNOSIS — Z992 Dependence on renal dialysis: Secondary | ICD-10-CM | POA: Diagnosis not present

## 2023-12-19 DIAGNOSIS — E1136 Type 2 diabetes mellitus with diabetic cataract: Secondary | ICD-10-CM | POA: Diagnosis not present

## 2023-12-19 DIAGNOSIS — H25812 Combined forms of age-related cataract, left eye: Secondary | ICD-10-CM | POA: Diagnosis not present

## 2023-12-19 DIAGNOSIS — I10 Essential (primary) hypertension: Secondary | ICD-10-CM | POA: Diagnosis not present

## 2023-12-20 DIAGNOSIS — H4312 Vitreous hemorrhage, left eye: Secondary | ICD-10-CM | POA: Diagnosis not present

## 2023-12-20 DIAGNOSIS — N2581 Secondary hyperparathyroidism of renal origin: Secondary | ICD-10-CM | POA: Diagnosis not present

## 2023-12-20 DIAGNOSIS — E113513 Type 2 diabetes mellitus with proliferative diabetic retinopathy with macular edema, bilateral: Secondary | ICD-10-CM | POA: Diagnosis not present

## 2023-12-20 DIAGNOSIS — N186 End stage renal disease: Secondary | ICD-10-CM | POA: Diagnosis not present

## 2023-12-20 DIAGNOSIS — H35033 Hypertensive retinopathy, bilateral: Secondary | ICD-10-CM | POA: Diagnosis not present

## 2023-12-20 DIAGNOSIS — Z992 Dependence on renal dialysis: Secondary | ICD-10-CM | POA: Diagnosis not present

## 2023-12-20 DIAGNOSIS — Z9842 Cataract extraction status, left eye: Secondary | ICD-10-CM | POA: Diagnosis not present

## 2023-12-20 DIAGNOSIS — H547 Unspecified visual loss: Secondary | ICD-10-CM | POA: Diagnosis not present

## 2023-12-20 DIAGNOSIS — Z961 Presence of intraocular lens: Secondary | ICD-10-CM | POA: Diagnosis not present

## 2023-12-20 DIAGNOSIS — H50112 Monocular exotropia, left eye: Secondary | ICD-10-CM | POA: Diagnosis not present

## 2023-12-20 DIAGNOSIS — H25811 Combined forms of age-related cataract, right eye: Secondary | ICD-10-CM | POA: Diagnosis not present

## 2023-12-22 ENCOUNTER — Other Ambulatory Visit (HOSPITAL_COMMUNITY): Payer: Self-pay | Admitting: Cardiology

## 2023-12-22 DIAGNOSIS — Z95811 Presence of heart assist device: Secondary | ICD-10-CM

## 2023-12-22 DIAGNOSIS — I5042 Chronic combined systolic (congestive) and diastolic (congestive) heart failure: Secondary | ICD-10-CM

## 2023-12-22 DIAGNOSIS — N1832 Chronic kidney disease, stage 3b: Secondary | ICD-10-CM

## 2023-12-23 ENCOUNTER — Other Ambulatory Visit (HOSPITAL_COMMUNITY): Payer: Self-pay | Admitting: Unknown Physician Specialty

## 2023-12-23 DIAGNOSIS — N2581 Secondary hyperparathyroidism of renal origin: Secondary | ICD-10-CM | POA: Diagnosis not present

## 2023-12-23 DIAGNOSIS — Z7901 Long term (current) use of anticoagulants: Secondary | ICD-10-CM

## 2023-12-23 DIAGNOSIS — N186 End stage renal disease: Secondary | ICD-10-CM | POA: Diagnosis not present

## 2023-12-23 DIAGNOSIS — Z95811 Presence of heart assist device: Secondary | ICD-10-CM

## 2023-12-23 DIAGNOSIS — Z992 Dependence on renal dialysis: Secondary | ICD-10-CM | POA: Diagnosis not present

## 2023-12-24 ENCOUNTER — Encounter (HOSPITAL_COMMUNITY): Admitting: Cardiology

## 2023-12-24 ENCOUNTER — Ambulatory Visit (HOSPITAL_COMMUNITY): Payer: Self-pay | Admitting: Cardiology

## 2023-12-24 ENCOUNTER — Encounter (HOSPITAL_COMMUNITY): Payer: Self-pay | Admitting: Cardiology

## 2023-12-24 ENCOUNTER — Ambulatory Visit (HOSPITAL_COMMUNITY)
Admission: RE | Admit: 2023-12-24 | Discharge: 2023-12-24 | Disposition: A | Discharge status: Home / Self Care | Source: Ambulatory Visit | Attending: Cardiology | Admitting: Cardiology

## 2023-12-24 ENCOUNTER — Ambulatory Visit (HOSPITAL_COMMUNITY): Payer: Self-pay | Admitting: Pharmacist

## 2023-12-24 VITALS — BP 121/108 | HR 79

## 2023-12-24 DIAGNOSIS — Z79899 Other long term (current) drug therapy: Secondary | ICD-10-CM | POA: Insufficient documentation

## 2023-12-24 DIAGNOSIS — I4891 Unspecified atrial fibrillation: Secondary | ICD-10-CM | POA: Insufficient documentation

## 2023-12-24 DIAGNOSIS — Z95811 Presence of heart assist device: Secondary | ICD-10-CM | POA: Insufficient documentation

## 2023-12-24 DIAGNOSIS — E1122 Type 2 diabetes mellitus with diabetic chronic kidney disease: Secondary | ICD-10-CM | POA: Insufficient documentation

## 2023-12-24 DIAGNOSIS — I5042 Chronic combined systolic (congestive) and diastolic (congestive) heart failure: Secondary | ICD-10-CM

## 2023-12-24 DIAGNOSIS — I5022 Chronic systolic (congestive) heart failure: Secondary | ICD-10-CM | POA: Insufficient documentation

## 2023-12-24 DIAGNOSIS — I2510 Atherosclerotic heart disease of native coronary artery without angina pectoris: Secondary | ICD-10-CM | POA: Insufficient documentation

## 2023-12-24 DIAGNOSIS — N185 Chronic kidney disease, stage 5: Secondary | ICD-10-CM

## 2023-12-24 DIAGNOSIS — I4892 Unspecified atrial flutter: Secondary | ICD-10-CM | POA: Diagnosis not present

## 2023-12-24 DIAGNOSIS — I10 Essential (primary) hypertension: Secondary | ICD-10-CM

## 2023-12-24 DIAGNOSIS — I132 Hypertensive heart and chronic kidney disease with heart failure and with stage 5 chronic kidney disease, or end stage renal disease: Secondary | ICD-10-CM | POA: Diagnosis not present

## 2023-12-24 DIAGNOSIS — N186 End stage renal disease: Secondary | ICD-10-CM | POA: Insufficient documentation

## 2023-12-24 DIAGNOSIS — E1142 Type 2 diabetes mellitus with diabetic polyneuropathy: Secondary | ICD-10-CM | POA: Diagnosis not present

## 2023-12-24 DIAGNOSIS — Z951 Presence of aortocoronary bypass graft: Secondary | ICD-10-CM | POA: Insufficient documentation

## 2023-12-24 DIAGNOSIS — D649 Anemia, unspecified: Secondary | ICD-10-CM | POA: Insufficient documentation

## 2023-12-24 DIAGNOSIS — Z4509 Encounter for adjustment and management of other cardiac device: Secondary | ICD-10-CM | POA: Diagnosis not present

## 2023-12-24 DIAGNOSIS — Z7901 Long term (current) use of anticoagulants: Secondary | ICD-10-CM | POA: Insufficient documentation

## 2023-12-24 DIAGNOSIS — I255 Ischemic cardiomyopathy: Secondary | ICD-10-CM | POA: Diagnosis not present

## 2023-12-24 DIAGNOSIS — D696 Thrombocytopenia, unspecified: Secondary | ICD-10-CM | POA: Insufficient documentation

## 2023-12-24 LAB — LACTATE DEHYDROGENASE: LDH: 178 U/L (ref 98–192)

## 2023-12-24 LAB — CBC
HCT: 34.8 % — ABNORMAL LOW (ref 39.0–52.0)
Hemoglobin: 10.9 g/dL — ABNORMAL LOW (ref 13.0–17.0)
MCH: 27.9 pg (ref 26.0–34.0)
MCHC: 31.3 g/dL (ref 30.0–36.0)
MCV: 89.2 fL (ref 80.0–100.0)
Platelets: 85 10*3/uL — ABNORMAL LOW (ref 150–400)
RBC: 3.9 MIL/uL — ABNORMAL LOW (ref 4.22–5.81)
RDW: 18.1 % — ABNORMAL HIGH (ref 11.5–15.5)
WBC: 8.1 10*3/uL (ref 4.0–10.5)
nRBC: 0 % (ref 0.0–0.2)

## 2023-12-24 LAB — BASIC METABOLIC PANEL WITH GFR
Anion gap: 16 — ABNORMAL HIGH (ref 5–15)
BUN: 23 mg/dL (ref 8–23)
CO2: 24 mmol/L (ref 22–32)
Calcium: 8.9 mg/dL (ref 8.9–10.3)
Chloride: 100 mmol/L (ref 98–111)
Creatinine, Ser: 5.38 mg/dL — ABNORMAL HIGH (ref 0.61–1.24)
GFR, Estimated: 11 mL/min — ABNORMAL LOW (ref >60)
Glucose, Bld: 96 mg/dL (ref 70–99)
Potassium: 3.6 mmol/L (ref 3.5–5.1)
Sodium: 140 mmol/L (ref 135–145)

## 2023-12-24 LAB — PROTIME-INR
INR: 1.5 — ABNORMAL HIGH (ref 0.8–1.2)
Prothrombin Time: 18.2 s — ABNORMAL HIGH (ref 11.4–15.2)

## 2023-12-24 MED ORDER — HYDRALAZINE HCL 25 MG PO TABS: 25.0000 mg | ORAL_TABLET | Freq: Two times a day (BID) | ORAL | 3 refills | Status: DC

## 2023-12-24 NOTE — Progress Notes (Addendum)
 Patient presents for 2 month follow up in VAD clinic with his brother Marcus Johnson. Reports no problems with VAD equipment or concerns with drive line.  Patient arrived via wheelchair today but was able to step on the scale for his weight. Pt denies dizziness, falls, and heart failure symptoms. Complains of intermittent lightheadedness and fatigue on dialysis days. Still experiencing intermittent nausea on HD days, but this has improved. Per Dr Mitzie Anda pt to trial using Zofran  before and after HD treatment to see if this mitigates nausea associated with HD. Pt verbalized understanding.   Reports intermittent hematuria. His PCP recently sent referral to [facility]. Pt has not scheduled an appt yet. Per Dr Mitzie Anda pt to make appt. Pt verbalized understanding.   Pt had cataract removal at [facility] 12/19/23. Taking 3 different eye drops. Pt unsure of names of 2 of them and plans to call VAD coordinator when he gets home so we can add medications to med list.   Pt has not had any recent ICD shocks and is taking his Amiodarone  200 BID and Mexiletine 150 mg BID as prescribed.   Patient reports he continues M/W/F dialysis. Pt confirms he takes Midodrine  TID on HD days only. BP elevated today. Minimal PI events, flows appropriate on VAD. Per Dr Mitzie Anda will start Hydralazine  25 mg BID on NON HD days. Prescription sent to pt's pharmacy. Pt verbalized understanding of new medication.   Pt on Keflex  500 mg BID (dosed for HD schedule). Marcus Johnson reports they are changing dressing weekly. Currently wearing 2 anchors. Reports he wears binder at home, but does not like to wear during dialysis.   Dry weight at HD: 83 kg. Pt states they typically pull 2-4 L at each treatment.   Platelet level has been trending down over the last few months. Platelets 85 today. Per Dr Mitzie Anda will refer to hematology for low platelet count.    Vital Signs:  Doppler Pressure: 114 Automatic BP: 121/108 (114) HR: 79 NSR O2: 100% RA    Weight: 189.2 lbs w/ eqt Last wt: 186.2 w/ eqt   VAD Indication: Destination Therapy due to CKD Stage 3b   LVAD assessment:  HM III: Speed: 5700 rpms Flow: 5.0 Power: 4.6 w    PI: 3.2 Alarms: none Events: 0 - 5  Fixed speed:  5700 Low speed limit:  5400  Primary controller: Back up battery due for replacement in 18 months Secondary controller: Back up battery due for replacement in 30 months  I reviewed the LVAD parameters from today and compared the results to the patient's prior recorded data. LVAD interrogation was NEGATIVE for significant power changes, NEGATIVE for clinical alarms and STABLE for PI events/speed drops. No programming changes were made and pump is functioning within specified parameters. Pt is performing daily controller and system monitor self tests along with completing weekly and monthly maintenance for LVAD equipment.   LVAD equipment check completed and is in good working order. Back-up equipment present and back up battery charged at this appointment.   Annual Equipment Maintenance on UBC/PM was performed 10/29/23.   Exit Site Care: Existing VAD dressing maintained by Marcus Johnson weekly. Dressing clean, dry, and intact. Anchor correctly applied. Denies fever or chills. Provided 12 anchors and a roll of medipore tape for home use.   Device: Medtronic single Therapies: VF: 240 VT: 200 Slow VT: 143 Pacing: VVI 40 Last check: 10/08/23   BP & Labs:  Doppler 114 - Doppler is reflecting MAP  Hgb 10.9 - No S/S  of bleeding. Specifically denies melena/BRBPR or nosebleeds.   LDH 178- established baseline of 160 - 180. Denies tea-colored urine. No power elevations noted on interrogation.   Plan: Start Hydralazine  25 mg twice a day on NON HD days only Coumadin  dosing per Marcus Johnson Return to VAD clinic in 2 months Please scheduled urology appointment to evaluate blood in urine We will send a referral to hematology for your low platelet count  Marcus Bosworth RN VAD Coordinator  Office: [phone]  24/7 Pager: [phone]   Follow up for Heart Failure/LVAD:  Chief complaint: LVAD followup  Marcus Johnson. is a 63 y.o. male who has a h/o DM2, CAD s/p CABG 2002, systolic HF due to ischemic cardiomyopathy with EF 20-25% (echo 12/15), DM2 and CKD. He is s/p Medtronic single chamber ICD.   Admitted in 12/15 due to ADHF. Required short course milrinone  for diuresis. Diuresed 30 pounds.    CPX 2/16 showed severely reduced functional capacity.  There was a significant disconnect between his symptoms and the CPX.  RHC in 6/16 showed fairly normal filling pressures and low but not markedly low cardiac output.    CPX (4/19) was submaximal but suggested severe limitation from HF.    He was admitted in 6/19 with marked volume overload and dyspnea.  Echo in 6/19 showed EF 20% with severe global hypokinesis.  LHC/RHC showed severe native vessel CAD with patent LIMA-D, SVG-OM, and SVG-PLV; cardiac index low.  He was started on milrinone  and diuresed.  We discussed LVAD extensively in light of low output, creatinine that is trending up and concern for decompensation of RV over time. He was adamant that he did not want to pursue LVAD workup yet and did not want a referral for transplant evaluation. Milrinone  was weaned off and he was discharged home.    Admitted 08/01/18-08/07/18 with volume overload and left foot wound. Diuresed 38 lbs with IV lasix  and then transitioned back to torsemide  100 mg BID. ID was consulted with concerns for left foot osteomyelitis noted on CT scan. He got IV ancef  q8 hours x 42 days. [facility] provided teaching and supplies for home antibiotics. Course complicated by AKI. DC weight 200 lbs.    He went into atrial flutter and had DCCV to NSR in 1/20.    Echo in 4/21 showed EF 25% with apical akinesis and diffuse hypokinesis relatively preserved in lateral wall, mildly decreased RV systolic function, PASP 41 mmHg. CPX in 5/21  showed severe functional limitation suggestive of advanced HF. 5/21 Zio patch showed short atrial flutter runs, 3.8% PVCs.    He developed COVID-19 infection in 1/22.  Symptoms were predominantly GI.    He developed progressive worsening of CHF in early 2022 and was admitted in 3/22 with low output HF.  Impella 5.5 was placed as bridge to Heartmate III LVAD placed in 10/13/20.  He developed post-op renal failure and ended up on dialysis.  He was markedly deconditioned post-op and went to CIR.    Still has pain and minimal function in his right hand.  Nerve conduction study showed a chronic sensorimotor neuropathy in the right arm.    Ramp echo in 3/23 showed severe LV dysfunction with mild RV enlargement and mild dysfunction, mild-moderate AI.  Speed was increased to 5800 rpm.  The aortic valve still opened with every beat but only slightly. Flow increased from 4.1 to 5.3 L/min and PI remained stable.   Patient was admitted in 4/23 with falls and lightheadedness. He  fractured his left foot when falling and initially was unable to walk.  He was thought to be hypotensive due to over-diuresis and dry weight was increased.  Midodrine  on HD days was also increased.  He was treated for a driveline infection with vancomycin  then doxycycline , now off.   ICD shock for VT in 7/23, not started on amiodarone  given only 1 episode.   Echo in 3/24 showed EF 20-25% with midline IV septum, mild RV dysfunction with severe RVE, moderate AI.   In 5/24, patient had VT treated with ATP.      Patient was shocked by his ICD for VT on 04/17/23, 04/20/23, and 04/21/23.  I had him increase his amiodarone  to 200 mg bid x 2 wks then back to 200 mg daily.  After discussion with Dr. Rodolfo Clan, I started him on mexiletine as well.    Ramp echo was in 9/24.  Starting speed 5800 rpm with moderate AI, slight aortic valve opening each beat, moderate RV enlargement/moderate RV dysfunction, septum shifted slightly to the left.  Speed  decreased to 5700 rpm with moderate AI, aortic valve opening each beat, midline septum with flow fairly stable at 5 L/min.  I left speed at 5700 rpm.   Patient returns for followup of LVAD.  MAP is elevated today.  He does not take midodrine  on non-HD days.  Few PI events on LVAD interrogation and no low flows.  He continues to have nausea after HD, lasts most of the rest of the day after HD.  He continues to have occasional episodes of hematuria. No dyspnea with ADLs and walking in to this office and dialysis unit.  No lightheadedness. Weight stable.    Labs (5/22): LDH 174, hgb 10.6 Labs (7/22): LDH 164 Labs (8/22): hgb 11.4 Labs (10/22): hgb 12 Labs (11/22): hgb 12.2 Labs (4/23): LDL 163, hgb 10.4 Labs (6/23): hgb 12.2, LDH 284 Labs (7/23): LFTs normal, hgb 11.3, LDH 235 Labs (9/23): hgb 10.2 Labs (1/24): hgb 10.6, LDH 279 Labs (3/24): LDH 222, hgb 11.2 Labs (6/24): LDH 219, hgb 11.3, TSH normal Labs (9/24): TSH normal, hgb 12, LFTs normal, LDH 215 Labs (1/25): LFTs normal, TSH normal Labs (3/25): LFTs normal, TSH normal, LDH 200, hgb 11.3, plts 115  Past Medical History:  Diagnosis Date   AICD (automatic cardioverter/defibrillator) present    Single-chamber  implantable cardiac defibrillator - Medtronic   Atrial fibrillation (HCC)    Cataract    Mixed form OU   CHF (congestive heart failure) (HCC)    Chronic kidney disease    Chronic kidney disease (CKD), stage III (moderate) (HCC)    Chronic systolic heart failure (HCC)    a. Echo 5/13: Mild LVE, mild LVH, EF 10%, anteroseptal, lateral, apical AK, mild MR, mild LAE, moderately reduced RVSF, mild RAE, PASP 60;  b. 07/2014 Echo: EF 20-2%, diff HK, AKI of antsep/apical/mid-apicalinferior, mod reduced RV.   Coronary artery disease    a. s/p CABG 2002 b. LHC 5/13:  dLM 80%, LAD subtotally occluded, pCFX occluded, pRCA 50%, mid? Occlusion with high take off of the PDA with 70% multiple lesions-not bypassed and supplies collaterals to  LAD, LIMA-IM/ramus ok, S-OM ok, S-PLA branches ok. Medical therapy was recommended   COVID    Diabetic retinopathy (HCC)    NPDR OD, PDR OS   Dyspnea    Gout    "on daily RX" (01/08/2018)   Hypertension    Hypertensive retinopathy    OU   Ischemic cardiomyopathy  a. Echo 5/13: Mild LVE, mild LVH, EF 10%, anteroseptal, lateral, apical AK, mild MR, mild LAE, moderately reduced RVSF, mild RAE, PASP 60;  b. 01/2012 s/p MDT [id number] Protecta XT VR AICD;  c. 07/2014 Echo: EF 20-2%, diff HK, AKI of antsep/apical/mid-apicalinferior, mod reduced RV.   MRSA (methicillin resistant Staphylococcus aureus)    Status post right foot plantar deep infection with MRSA status post  I&D 10/2008   Myocardial infarction [facility])    "was told I'd had several before heart OR in 2002" (01/08/2018)   Noncompliance    Peripheral neuropathy    Retinopathy, diabetic, background (HCC)    Syncope    Type II diabetes mellitus (HCC)    requiring insulin     Vitreous hemorrhage, left (HCC)    and proliferative diabetic retinopathy    Current Outpatient Medications  Medication Sig Dispense Refill   acetaminophen  (TYLENOL ) 325 MG tablet Take by mouth.     allopurinol  (ZYLOPRIM ) 300 MG tablet Take by mouth.     amiodarone  (PACERONE ) 200 MG tablet Take 1 tablet (200 mg total) by mouth 2 (two) times daily. Take 1 tablet (200 mg) by mouth twice daily for 2 weeks starting 04/23/23, then decrease to 1 tablet (200 mg) daily 05/07/23. (Patient taking differently: Take 200 mg by mouth daily.) 90 tablet 3   BD PEN NEEDLE NANO 2ND GEN 32G X 4 MM MISC USE TO INJECT INSULIN  DAILY 100 each 0   Blood Glucose Monitoring Suppl (ONE TOUCH ULTRA 2) w/Device KIT 1 Device by Does not apply route 3 (three) times daily. 1 kit 0   cephALEXin  (KEFLEX ) 500 MG capsule Take 500 mg by mouth 2 (two) times daily.     gabapentin  (NEURONTIN ) 100 MG capsule TAKE 1 CAPSULE ( 100 MG) IN THE MORNING AND 2 CAPSULES ( 200 MG) AT BEDTIME 90 capsule 3   glucose blood  (ACCU-CHEK GUIDE) test strip USE AS INSTRUCTED 3 TIMES DAILY 100 strip 1   hydrALAZINE  (APRESOLINE ) 25 MG tablet Take 1 tablet (25 mg total) by mouth in the morning and at bedtime. Take only on NON HD days 180 tablet 3   insulin  glargine (LANTUS  SOLOSTAR) 100 UNIT/ML Solostar Pen Inject 60 Units into the skin at bedtime. (Patient taking differently: Inject 60 Units into the skin daily as needed.) 30 mL 11   ketorolac (ACULAR) 0.5 % ophthalmic solution Place 1 drop into the left eye 4 (four) times daily.     mexiletine (MEXITIL) 150 MG capsule Take 1 capsule (150 mg total) by mouth 2 (two) times daily. 180 capsule 3   midodrine  (PROAMATINE ) 10 MG tablet TAKE 2 TABLETS 3 TIMES DAILY ON DIALYSIS DAYS NO MIDODRINE  ON NON DIALYSIS DAYS 240 tablet 7   multivitamin (RENA-VIT) TABS tablet TAKE 1 TABLET BY MOUTH EVERYDAY AT BEDTIME 90 tablet 3   ondansetron  (ZOFRAN ) 4 MG tablet Take 1 tablet (4 mg total) by mouth every 8 (eight) hours as needed for nausea or vomiting. 30 tablet 3   OneTouch UltraSoft 2 Lancets MISC 1 each by Does not apply route 3 (three) times daily before meals. 100 each 1   pantoprazole  (PROTONIX ) 40 MG tablet TAKE 1 TABLET BY MOUTH EVERY DAY 90 tablet 3   polyethylene glycol powder (GLYCOLAX /MIRALAX ) 17 GM/SCOOP powder      rosuvastatin  (CRESTOR ) 10 MG tablet Take 1 tablet (10 mg total) by mouth daily. 90 tablet 3   sevelamer (RENAGEL) 800 MG tablet Take 800 mg by mouth 3 (three) times daily.  traMADol  (ULTRAM ) 50 MG tablet TAKE 1 TABLET BY MOUTH EVERY 12 HOURS AS NEEDED 30 tablet 3   warfarin (COUMADIN ) 3 MG tablet TAKE 4.5 MG (1.5 TABS) DAILY OR AS DIRECTED BY HF CLINIC (Patient taking differently: Take 3 mg by mouth daily at 4 PM. 1.5 mg (3 mg x 0.5) every Mon, Wed, Fri; 3 mg (3 mg x 1) all other days) 150 tablet 3   moxifloxacin (VIGAMOX) 0.5 % ophthalmic solution Apply 1 drop to eye 3 (three) times daily.     naphazoline-glycerin  (CLEAR EYES REDNESS) 0.012-0.25 % SOLN Place 1-2  drops into the right eye 4 (four) times daily as needed for eye irritation. (Patient not taking: Reported on 12/24/2023)     prednisoLONE  acetate (PRED FORTE ) 1 % ophthalmic suspension 1 drop 4 (four) times daily.     No current facility-administered medications for this encounter.    Meclizine  hcl and Ivabradine   REVIEW OF SYSTEMS: All systems negative except as listed in HPI, PMH and Problem list.   LVAD INTERROGATION:  Please see LVAD nurse's note above, I reviewed parameters (stable)   Physical Exam: BP (!) 121/108 Comment: map 114  Pulse 79   SpO2 100%  MAP 114 General: Well appearing this am. NAD.  HEENT: Normal. Neck: Supple, JVP 8-9 cm. Carotids OK.  Cardiac:  Mechanical heart sounds with LVAD hum present.  Lungs:  CTAB, normal effort.  Abdomen:  NT, ND, no HSM. No bruits or masses. +BS  LVAD exit site: Well-healed and incorporated. Dressing dry and intact. No erythema or drainage. Stabilization device present and accurately applied. Driveline dressing changed daily per sterile technique. Extremities:  Warm and dry. No cyanosis, clubbing, rash, or edema.  Neuro:  Alert & oriented x 3. Cranial nerves grossly intact. Moves all 4 extremities w/o difficulty. Affect pleasant    ASSESSMENT AND PLAN:  1. Chronic systolic CHF:  Ischemic cardiomyopathy. Has Medtronic ICD. Low output HF, admitted and Impella 5.5 placed 10/04/20.  HM3 LVAD was placed on 10/13/20. Developed post-op renal failure requiring eventual hemodialysis.  Ramp echo in 3/23, speed increased to 5800 rpm under echo guidance with increase in flow and stable PI.  Echo in 3/24 showed EF 20-25% with midline IV septum, mild RV dysfunction with severe RVE, moderate AI. Ramp echo in 9/24 with decrease in speed in setting of VT episodes.  MAP elevated on non-HD days, takes midodrine  on HD days. No further VT.  Mild volume overload, NYHA class II.  LVAD interrogation shows rare PI events, no low flows/suction.  - Continue  midodrine  on HD days and reminded him not to take midodrine  on the non-HD days.    - With significantly elevated MAP on non-HD days, will have him take hydralazine  25 mg bid on non-HD days.  2. LVAD:  VAD interrogated personally.  Parameters stable with a few PI events on HD days. No low flow events or suction.  - He is now off ASA.  - Continue warfarin with INR goal 2-2.5.   3. CAD s/p CABG 2002:  Last cath in 6/19 with patent grafts. No chest pain.  - Continue Crestor   4. ESRD: Currently dialyzing via tunneled catheter. No low flow events. Has nausea after HD.  - Continue HD M-W-F  - With continuous flow LVAD, think AV fistula would be unlikely to mature.   - Would try taking Zofran  on HD days to see if it can help his nausea.  5. Atrial flutter/fibrillation: s/p DC-CV 08/21/18.  Has occasional  episodes of AF but not prolonged.  - Continue amiodarone , check LFTs and TSH today.  He will need regular eye exam.  6. 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 This has been improving though not completely normal.   7. DM: Per primary care.  8. Anemia: CBC today, no BRBPR/melena.  9. Suspected Gastroparesis: He is no longer taking Reglan .  10. VT: Episode in 7/23 with successful ICD discharge.  No definite trigger, he did not actually feel the VT. VT again in 5/24 treated with ATP, amiodarone  started.  Episodes of VT 9/11, 9/14, and 04/21/23.  Ramp echo in 9/24 with decreased speed.  No further VT.  - Continue amiodarone  200 mg daily.  - Continue mexiletine 150 mg bid.  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 Driveline site looks good today.  12. Thrombocytopenia: Platelets are mildly low and have been trending down.   - CBC today, if plts lower, will refer to hematology.   Followup 2 months.   I spent 42 minutes reviewing records, interviewing/examining patient, and managing orders.     Marcus Johnson 12/24/2023

## 2023-12-24 NOTE — Patient Instructions (Addendum)
 Start Hydralazine  25 mg twice a day on NON HD days only Coumadin  dosing per Lauren PharmD Return to VAD clinic in 2 months Please scheduled urology appointment to evaluate blood in urine We will send a referral to hematology for your low platelet count

## 2023-12-25 DIAGNOSIS — N186 End stage renal disease: Secondary | ICD-10-CM | POA: Diagnosis not present

## 2023-12-25 DIAGNOSIS — Z961 Presence of intraocular lens: Secondary | ICD-10-CM | POA: Diagnosis not present

## 2023-12-25 DIAGNOSIS — N2581 Secondary hyperparathyroidism of renal origin: Secondary | ICD-10-CM | POA: Diagnosis not present

## 2023-12-25 DIAGNOSIS — E113513 Type 2 diabetes mellitus with proliferative diabetic retinopathy with macular edema, bilateral: Secondary | ICD-10-CM | POA: Diagnosis not present

## 2023-12-25 DIAGNOSIS — H35033 Hypertensive retinopathy, bilateral: Secondary | ICD-10-CM | POA: Diagnosis not present

## 2023-12-25 DIAGNOSIS — H4312 Vitreous hemorrhage, left eye: Secondary | ICD-10-CM | POA: Diagnosis not present

## 2023-12-25 DIAGNOSIS — H25811 Combined forms of age-related cataract, right eye: Secondary | ICD-10-CM | POA: Diagnosis not present

## 2023-12-25 DIAGNOSIS — H50112 Monocular exotropia, left eye: Secondary | ICD-10-CM | POA: Diagnosis not present

## 2023-12-25 DIAGNOSIS — E1136 Type 2 diabetes mellitus with diabetic cataract: Secondary | ICD-10-CM | POA: Diagnosis not present

## 2023-12-25 DIAGNOSIS — Z992 Dependence on renal dialysis: Secondary | ICD-10-CM | POA: Diagnosis not present

## 2023-12-25 DIAGNOSIS — Z9842 Cataract extraction status, left eye: Secondary | ICD-10-CM | POA: Diagnosis not present

## 2023-12-27 DIAGNOSIS — N2581 Secondary hyperparathyroidism of renal origin: Secondary | ICD-10-CM | POA: Diagnosis not present

## 2023-12-27 DIAGNOSIS — N186 End stage renal disease: Secondary | ICD-10-CM | POA: Diagnosis not present

## 2023-12-27 DIAGNOSIS — Z992 Dependence on renal dialysis: Secondary | ICD-10-CM | POA: Diagnosis not present

## 2023-12-30 DIAGNOSIS — Z992 Dependence on renal dialysis: Secondary | ICD-10-CM | POA: Diagnosis not present

## 2023-12-30 DIAGNOSIS — N186 End stage renal disease: Secondary | ICD-10-CM | POA: Diagnosis not present

## 2023-12-30 DIAGNOSIS — N2581 Secondary hyperparathyroidism of renal origin: Secondary | ICD-10-CM | POA: Diagnosis not present

## 2023-12-31 ENCOUNTER — Telehealth (HOSPITAL_COMMUNITY): Payer: Self-pay | Admitting: Unknown Physician Specialty

## 2023-12-31 ENCOUNTER — Ambulatory Visit (HOSPITAL_COMMUNITY): Payer: Self-pay | Admitting: Pharmacist

## 2023-12-31 DIAGNOSIS — Z7901 Long term (current) use of anticoagulants: Secondary | ICD-10-CM | POA: Diagnosis not present

## 2023-12-31 LAB — POCT INR: INR: 2.5 (ref 2.0–3.0)

## 2023-12-31 NOTE — Telephone Encounter (Signed)
 Pt called the office and stated that he started the Hydralazine  25 mg bid prescribed by Dr Mitzie Anda. Pt states that he felt dizzy, shaky and overall terrible after taking the hydralazine . Pt states his bp was 80/50. Pt tells me that he stopped the med. Dr Mitzie Anda informed of the above. Med d/cd in chart.  Adams Adams RN, BSN VAD Coordinator 24/7 Pager [phone]

## 2024-01-01 DIAGNOSIS — Z992 Dependence on renal dialysis: Secondary | ICD-10-CM | POA: Diagnosis not present

## 2024-01-01 DIAGNOSIS — N2581 Secondary hyperparathyroidism of renal origin: Secondary | ICD-10-CM | POA: Diagnosis not present

## 2024-01-01 DIAGNOSIS — N186 End stage renal disease: Secondary | ICD-10-CM | POA: Diagnosis not present

## 2024-01-03 DIAGNOSIS — N2581 Secondary hyperparathyroidism of renal origin: Secondary | ICD-10-CM | POA: Diagnosis not present

## 2024-01-03 DIAGNOSIS — Z992 Dependence on renal dialysis: Secondary | ICD-10-CM | POA: Diagnosis not present

## 2024-01-03 DIAGNOSIS — N186 End stage renal disease: Secondary | ICD-10-CM | POA: Diagnosis not present

## 2024-01-04 DIAGNOSIS — N186 End stage renal disease: Secondary | ICD-10-CM | POA: Diagnosis not present

## 2024-01-04 DIAGNOSIS — I509 Heart failure, unspecified: Secondary | ICD-10-CM | POA: Diagnosis not present

## 2024-01-06 DIAGNOSIS — N2581 Secondary hyperparathyroidism of renal origin: Secondary | ICD-10-CM | POA: Diagnosis not present

## 2024-01-06 DIAGNOSIS — N186 End stage renal disease: Secondary | ICD-10-CM | POA: Diagnosis not present

## 2024-01-06 DIAGNOSIS — Z992 Dependence on renal dialysis: Secondary | ICD-10-CM | POA: Diagnosis not present

## 2024-01-07 ENCOUNTER — Ambulatory Visit (HOSPITAL_COMMUNITY): Payer: Self-pay | Admitting: Pharmacist

## 2024-01-07 ENCOUNTER — Inpatient Hospital Stay

## 2024-01-07 ENCOUNTER — Inpatient Hospital Stay (HOSPITAL_BASED_OUTPATIENT_CLINIC_OR_DEPARTMENT_OTHER)

## 2024-01-07 ENCOUNTER — Ambulatory Visit (INDEPENDENT_AMBULATORY_CARE_PROVIDER_SITE_OTHER): Payer: Medicare HMO

## 2024-01-07 VITALS — BP 96/83 | HR 79 | Temp 97.7°F | Resp 17 | Ht 70.0 in | Wt 187.5 lb

## 2024-01-07 DIAGNOSIS — Z79899 Other long term (current) drug therapy: Secondary | ICD-10-CM | POA: Insufficient documentation

## 2024-01-07 DIAGNOSIS — I255 Ischemic cardiomyopathy: Secondary | ICD-10-CM | POA: Diagnosis not present

## 2024-01-07 DIAGNOSIS — E1140 Type 2 diabetes mellitus with diabetic neuropathy, unspecified: Secondary | ICD-10-CM | POA: Insufficient documentation

## 2024-01-07 DIAGNOSIS — N186 End stage renal disease: Secondary | ICD-10-CM | POA: Insufficient documentation

## 2024-01-07 DIAGNOSIS — E1122 Type 2 diabetes mellitus with diabetic chronic kidney disease: Secondary | ICD-10-CM | POA: Insufficient documentation

## 2024-01-07 DIAGNOSIS — D696 Thrombocytopenia, unspecified: Secondary | ICD-10-CM | POA: Diagnosis present

## 2024-01-07 DIAGNOSIS — D649 Anemia, unspecified: Secondary | ICD-10-CM | POA: Diagnosis not present

## 2024-01-07 DIAGNOSIS — Z992 Dependence on renal dialysis: Secondary | ICD-10-CM

## 2024-01-07 DIAGNOSIS — R310 Gross hematuria: Secondary | ICD-10-CM | POA: Diagnosis not present

## 2024-01-07 LAB — FERRITIN: Ferritin: 1640 ng/mL — ABNORMAL HIGH (ref 24–336)

## 2024-01-07 LAB — CBC WITH DIFFERENTIAL (CANCER CENTER ONLY)
Abs Immature Granulocytes: 0.05 10*3/uL (ref 0.00–0.07)
Basophils Absolute: 0 10*3/uL (ref 0.0–0.1)
Basophils Relative: 0 %
Eosinophils Absolute: 0.2 10*3/uL (ref 0.0–0.5)
Eosinophils Relative: 3 %
HCT: 34.1 % — ABNORMAL LOW (ref 39.0–52.0)
Hemoglobin: 10.8 g/dL — ABNORMAL LOW (ref 13.0–17.0)
Immature Granulocytes: 1 %
Lymphocytes Relative: 25 %
Lymphs Abs: 2 10*3/uL (ref 0.7–4.0)
MCH: 27.9 pg (ref 26.0–34.0)
MCHC: 31.7 g/dL (ref 30.0–36.0)
MCV: 88.1 fL (ref 80.0–100.0)
Monocytes Absolute: 0.7 10*3/uL (ref 0.1–1.0)
Monocytes Relative: 8 %
Neutro Abs: 5.2 10*3/uL (ref 1.7–7.7)
Neutrophils Relative %: 63 %
Platelet Count: 89 10*3/uL — ABNORMAL LOW (ref 150–400)
RBC: 3.87 MIL/uL — ABNORMAL LOW (ref 4.22–5.81)
RDW: 18.6 % — ABNORMAL HIGH (ref 11.5–15.5)
WBC Count: 8.2 10*3/uL (ref 4.0–10.5)
nRBC: 0 % (ref 0.0–0.2)

## 2024-01-07 LAB — LACTATE DEHYDROGENASE: LDH: 194 U/L — ABNORMAL HIGH (ref 98–192)

## 2024-01-07 LAB — HEPATITIS C ANTIBODY: HCV Ab: NONREACTIVE

## 2024-01-07 LAB — HEPATIC FUNCTION PANEL
ALT: 16 U/L (ref 0–44)
AST: 31 U/L (ref 15–41)
Albumin: 4.3 g/dL (ref 3.5–5.0)
Alkaline Phosphatase: 86 U/L (ref 38–126)
Bilirubin, Direct: 0.1 mg/dL (ref 0.0–0.2)
Indirect Bilirubin: 1.2 mg/dL — ABNORMAL HIGH (ref 0.3–0.9)
Total Bilirubin: 1.3 mg/dL — ABNORMAL HIGH (ref 0.0–1.2)
Total Protein: 8.1 g/dL (ref 6.5–8.1)

## 2024-01-07 LAB — IRON AND IRON BINDING CAPACITY (CC-WL,HP ONLY)
Iron: 56 ug/dL (ref 45–182)
Saturation Ratios: 20 % (ref 17.9–39.5)
TIBC: 283 ug/dL (ref 250–450)
UIBC: 227 ug/dL (ref 117–376)

## 2024-01-07 LAB — POCT INR: INR: 2.8 (ref 2.0–3.0)

## 2024-01-07 LAB — VITAMIN B12: Vitamin B-12: 556 pg/mL (ref 180–914)

## 2024-01-07 LAB — HIV ANTIBODY (ROUTINE TESTING W REFLEX): HIV Screen 4th Generation wRfx: NONREACTIVE

## 2024-01-07 LAB — WBC/PLT IN CITRATE

## 2024-01-07 LAB — FOLATE: Folate: >40 ng/mL (ref >5.9)

## 2024-01-07 NOTE — Assessment & Plan Note (Addendum)
 With normocytic anemia Citrated platelet Check b12, MMA, folate, copper

## 2024-01-07 NOTE — Assessment & Plan Note (Addendum)
 With chronic disease, ESRD Ferritin and iron panel, b12, folate, copper, LFT.
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
## 2024-01-08 DIAGNOSIS — Z992 Dependence on renal dialysis: Secondary | ICD-10-CM | POA: Diagnosis not present

## 2024-01-08 DIAGNOSIS — N2581 Secondary hyperparathyroidism of renal origin: Secondary | ICD-10-CM | POA: Diagnosis not present

## 2024-01-08 DIAGNOSIS — N186 End stage renal disease: Secondary | ICD-10-CM | POA: Diagnosis not present

## 2024-01-08 LAB — CUP PACEART REMOTE DEVICE CHECK
Battery Remaining Longevity: 104 mo
Battery Voltage: 3 V
Brady Statistic RV Percent Paced: 0.01 %
Date Time Interrogation Session: 20250603072953
HighPow Impedance: 35 Ohm
HighPow Impedance: 42 Ohm
Implantable Lead Connection Status: 753985
Implantable Lead Implant Date: 20130603
Implantable Lead Location: 753860
Implantable Lead Model: 6947
Implantable Pulse Generator Implant Date: 20211129
Lead Channel Impedance Value: 475 Ohm
Lead Channel Impedance Value: 760 Ohm
Lead Channel Pacing Threshold Amplitude: 0.75 V
Lead Channel Pacing Threshold Pulse Width: 0.4 ms
Lead Channel Sensing Intrinsic Amplitude: 4 mV
Lead Channel Sensing Intrinsic Amplitude: 4 mV
Lead Channel Setting Pacing Amplitude: 2 V
Lead Channel Setting Pacing Pulse Width: 0.4 ms
Lead Channel Setting Sensing Sensitivity: 0.3 mV
Zone Setting Status: 755011
Zone Setting Status: 755011

## 2024-01-09 ENCOUNTER — Other Ambulatory Visit (HOSPITAL_COMMUNITY): Payer: Self-pay | Admitting: Urology

## 2024-01-09 DIAGNOSIS — R310 Gross hematuria: Secondary | ICD-10-CM

## 2024-01-09 LAB — METHYLMALONIC ACID, SERUM: Methylmalonic Acid, Quantitative: 663 nmol/L — ABNORMAL HIGH (ref 0–378)

## 2024-01-09 LAB — COPPER, SERUM: Copper: 100 ug/dL (ref 69–132)

## 2024-01-10 DIAGNOSIS — N186 End stage renal disease: Secondary | ICD-10-CM | POA: Diagnosis not present

## 2024-01-10 DIAGNOSIS — N2581 Secondary hyperparathyroidism of renal origin: Secondary | ICD-10-CM | POA: Diagnosis not present

## 2024-01-10 DIAGNOSIS — Z992 Dependence on renal dialysis: Secondary | ICD-10-CM | POA: Diagnosis not present

## 2024-01-13 ENCOUNTER — Telehealth: Payer: Self-pay

## 2024-01-13 DIAGNOSIS — N186 End stage renal disease: Secondary | ICD-10-CM | POA: Diagnosis not present

## 2024-01-13 DIAGNOSIS — Z992 Dependence on renal dialysis: Secondary | ICD-10-CM | POA: Diagnosis not present

## 2024-01-13 DIAGNOSIS — D696 Thrombocytopenia, unspecified: Secondary | ICD-10-CM

## 2024-01-13 DIAGNOSIS — N2581 Secondary hyperparathyroidism of renal origin: Secondary | ICD-10-CM | POA: Diagnosis not present

## 2024-01-13 NOTE — Telephone Encounter (Signed)
 Called but no answer.  Left message. Thrombocytopenia stable, though persistent. No other clear findings from peripheral blood draw. Will obtain bone marrow biopsy for evaluation.  Recommend follow up about few weeks after bone marrow biopsy to go over results.  Please schedule a follow up with me about 2-3 weeks after his bone marrow biopsy. Thank you.

## 2024-01-14 ENCOUNTER — Ambulatory Visit (HOSPITAL_COMMUNITY): Payer: Self-pay | Admitting: Pharmacist

## 2024-01-14 LAB — POCT INR: INR: 2.7 (ref 2.0–3.0)
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
## 2024-01-15 ENCOUNTER — Ambulatory Visit: Payer: Self-pay | Admitting: Cardiology

## 2024-01-15 ENCOUNTER — Telehealth: Payer: Self-pay | Admitting: *Deleted

## 2024-01-15 DIAGNOSIS — N2581 Secondary hyperparathyroidism of renal origin: Secondary | ICD-10-CM | POA: Diagnosis not present

## 2024-01-15 DIAGNOSIS — Z992 Dependence on renal dialysis: Secondary | ICD-10-CM | POA: Diagnosis not present

## 2024-01-15 DIAGNOSIS — N186 End stage renal disease: Secondary | ICD-10-CM | POA: Diagnosis not present

## 2024-01-15 NOTE — Telephone Encounter (Signed)
-----   Message from [place] R sent at 01/14/2024  4:42 PM EDT ----- This patient accidentally called [facility]. They send me a voicemail that he left them. Saying that someone called him about a biopsy and didn't give a time. I'm not sure but can you call him when you have time?

## 2024-01-15 NOTE — Telephone Encounter (Signed)
 LM with details of 02/11/2024 biopsy appointment information. To call if he has questions

## 2024-01-17 DIAGNOSIS — N2581 Secondary hyperparathyroidism of renal origin: Secondary | ICD-10-CM | POA: Diagnosis not present

## 2024-01-17 DIAGNOSIS — Z992 Dependence on renal dialysis: Secondary | ICD-10-CM | POA: Diagnosis not present

## 2024-01-17 DIAGNOSIS — N186 End stage renal disease: Secondary | ICD-10-CM | POA: Diagnosis not present

## 2024-01-20 DIAGNOSIS — Z992 Dependence on renal dialysis: Secondary | ICD-10-CM | POA: Diagnosis not present

## 2024-01-20 DIAGNOSIS — N2581 Secondary hyperparathyroidism of renal origin: Secondary | ICD-10-CM | POA: Diagnosis not present

## 2024-01-20 DIAGNOSIS — N186 End stage renal disease: Secondary | ICD-10-CM | POA: Diagnosis not present

## 2024-01-21 ENCOUNTER — Encounter (INDEPENDENT_AMBULATORY_CARE_PROVIDER_SITE_OTHER): Payer: Self-pay | Admitting: Ophthalmology

## 2024-01-21 ENCOUNTER — Ambulatory Visit (INDEPENDENT_AMBULATORY_CARE_PROVIDER_SITE_OTHER): Admitting: Ophthalmology

## 2024-01-21 ENCOUNTER — Ambulatory Visit (HOSPITAL_COMMUNITY): Payer: Self-pay | Admitting: Pharmacist

## 2024-01-21 DIAGNOSIS — E113513 Type 2 diabetes mellitus with proliferative diabetic retinopathy with macular edema, bilateral: Secondary | ICD-10-CM

## 2024-01-21 DIAGNOSIS — H35033 Hypertensive retinopathy, bilateral: Secondary | ICD-10-CM

## 2024-01-21 DIAGNOSIS — Z794 Long term (current) use of insulin: Secondary | ICD-10-CM

## 2024-01-21 DIAGNOSIS — H25813 Combined forms of age-related cataract, bilateral: Secondary | ICD-10-CM

## 2024-01-21 DIAGNOSIS — I10 Essential (primary) hypertension: Secondary | ICD-10-CM

## 2024-01-21 DIAGNOSIS — H25811 Combined forms of age-related cataract, right eye: Secondary | ICD-10-CM

## 2024-01-21 DIAGNOSIS — Z961 Presence of intraocular lens: Secondary | ICD-10-CM

## 2024-01-21 DIAGNOSIS — H4312 Vitreous hemorrhage, left eye: Secondary | ICD-10-CM

## 2024-01-21 LAB — POCT INR: INR: 2 (ref 2.0–3.0)

## 2024-01-21 MED ORDER — BEVACIZUMAB CHEMO INJECTION 1.25MG/0.05ML SYRINGE FOR KALEIDOSCOPE
1.2500 mg | INTRAVITREAL | Status: AC | PRN
  Administered 2024-01-21: 1.25 mg via INTRAVITREAL

## 2024-01-22 DIAGNOSIS — Z992 Dependence on renal dialysis: Secondary | ICD-10-CM | POA: Diagnosis not present

## 2024-01-22 DIAGNOSIS — N2581 Secondary hyperparathyroidism of renal origin: Secondary | ICD-10-CM | POA: Diagnosis not present

## 2024-01-22 DIAGNOSIS — N186 End stage renal disease: Secondary | ICD-10-CM | POA: Diagnosis not present

## 2024-01-23 ENCOUNTER — Other Ambulatory Visit (HOSPITAL_COMMUNITY): Payer: Self-pay | Admitting: Cardiology

## 2024-01-23 DIAGNOSIS — Z95811 Presence of heart assist device: Secondary | ICD-10-CM

## 2024-01-23 DIAGNOSIS — Z7901 Long term (current) use of anticoagulants: Secondary | ICD-10-CM

## 2024-01-23 DIAGNOSIS — K3184 Gastroparesis: Secondary | ICD-10-CM

## 2024-01-23 DIAGNOSIS — I951 Orthostatic hypotension: Secondary | ICD-10-CM

## 2024-01-24 DIAGNOSIS — N2581 Secondary hyperparathyroidism of renal origin: Secondary | ICD-10-CM | POA: Diagnosis not present

## 2024-01-24 DIAGNOSIS — Z992 Dependence on renal dialysis: Secondary | ICD-10-CM | POA: Diagnosis not present

## 2024-01-24 DIAGNOSIS — N186 End stage renal disease: Secondary | ICD-10-CM | POA: Diagnosis not present

## 2024-01-27 DIAGNOSIS — Z992 Dependence on renal dialysis: Secondary | ICD-10-CM | POA: Diagnosis not present

## 2024-01-27 DIAGNOSIS — N2581 Secondary hyperparathyroidism of renal origin: Secondary | ICD-10-CM | POA: Diagnosis not present

## 2024-01-27 DIAGNOSIS — N186 End stage renal disease: Secondary | ICD-10-CM | POA: Diagnosis not present

## 2024-01-28 ENCOUNTER — Ambulatory Visit (HOSPITAL_COMMUNITY): Payer: Self-pay | Admitting: Pharmacist

## 2024-01-28 DIAGNOSIS — Z7901 Long term (current) use of anticoagulants: Secondary | ICD-10-CM | POA: Diagnosis not present

## 2024-01-28 LAB — POCT INR: INR: 1.6 — AB (ref 2.0–3.0)

## 2024-01-29 DIAGNOSIS — Z992 Dependence on renal dialysis: Secondary | ICD-10-CM | POA: Diagnosis not present

## 2024-01-29 DIAGNOSIS — N2581 Secondary hyperparathyroidism of renal origin: Secondary | ICD-10-CM | POA: Diagnosis not present

## 2024-01-29 DIAGNOSIS — N186 End stage renal disease: Secondary | ICD-10-CM | POA: Diagnosis not present

## 2024-01-30 ENCOUNTER — Ambulatory Visit (HOSPITAL_COMMUNITY)
Admission: RE | Admit: 2024-01-30 | Discharge: 2024-01-30 | Disposition: A | Discharge status: Home / Self Care | Source: Ambulatory Visit | Attending: Urology | Admitting: Urology

## 2024-01-30 DIAGNOSIS — R310 Gross hematuria: Secondary | ICD-10-CM | POA: Diagnosis not present

## 2024-01-30 DIAGNOSIS — N3289 Other specified disorders of bladder: Secondary | ICD-10-CM | POA: Diagnosis not present

## 2024-01-30 DIAGNOSIS — K8020 Calculus of gallbladder without cholecystitis without obstruction: Secondary | ICD-10-CM | POA: Diagnosis not present

## 2024-01-30 DIAGNOSIS — N201 Calculus of ureter: Secondary | ICD-10-CM | POA: Diagnosis not present

## 2024-01-30 MED ORDER — SODIUM CHLORIDE (PF) 0.9 % IJ SOLN
INTRAMUSCULAR | Status: AC
  Filled 2024-01-30: qty 300

## 2024-01-30 MED ORDER — IOHEXOL 300 MG/ML  SOLN
100.0000 mL | Freq: Once | INTRAMUSCULAR | Status: AC | PRN
  Administered 2024-01-30: 100 mL via INTRAVENOUS

## 2024-01-31 DIAGNOSIS — N186 End stage renal disease: Secondary | ICD-10-CM | POA: Diagnosis not present

## 2024-01-31 DIAGNOSIS — N2581 Secondary hyperparathyroidism of renal origin: Secondary | ICD-10-CM | POA: Diagnosis not present

## 2024-01-31 DIAGNOSIS — Z992 Dependence on renal dialysis: Secondary | ICD-10-CM | POA: Diagnosis not present

## 2024-02-03 DIAGNOSIS — I509 Heart failure, unspecified: Secondary | ICD-10-CM | POA: Diagnosis not present

## 2024-02-03 DIAGNOSIS — N186 End stage renal disease: Secondary | ICD-10-CM | POA: Diagnosis not present

## 2024-02-03 DIAGNOSIS — Z992 Dependence on renal dialysis: Secondary | ICD-10-CM | POA: Diagnosis not present

## 2024-02-03 DIAGNOSIS — N2581 Secondary hyperparathyroidism of renal origin: Secondary | ICD-10-CM | POA: Diagnosis not present

## 2024-02-04 ENCOUNTER — Ambulatory Visit (HOSPITAL_COMMUNITY): Payer: Self-pay | Admitting: Pharmacist

## 2024-02-04 LAB — POCT INR: INR: 1.8 — AB (ref 2.0–3.0)

## 2024-02-05 DIAGNOSIS — E113513 Type 2 diabetes mellitus with proliferative diabetic retinopathy with macular edema, bilateral: Secondary | ICD-10-CM | POA: Diagnosis not present

## 2024-02-05 DIAGNOSIS — N186 End stage renal disease: Secondary | ICD-10-CM | POA: Diagnosis not present

## 2024-02-05 DIAGNOSIS — H25811 Combined forms of age-related cataract, right eye: Secondary | ICD-10-CM | POA: Diagnosis not present

## 2024-02-05 DIAGNOSIS — H35033 Hypertensive retinopathy, bilateral: Secondary | ICD-10-CM | POA: Diagnosis not present

## 2024-02-05 DIAGNOSIS — N2581 Secondary hyperparathyroidism of renal origin: Secondary | ICD-10-CM | POA: Diagnosis not present

## 2024-02-05 DIAGNOSIS — H50112 Monocular exotropia, left eye: Secondary | ICD-10-CM | POA: Diagnosis not present

## 2024-02-05 DIAGNOSIS — H4312 Vitreous hemorrhage, left eye: Secondary | ICD-10-CM | POA: Diagnosis not present

## 2024-02-05 DIAGNOSIS — Z961 Presence of intraocular lens: Secondary | ICD-10-CM | POA: Diagnosis not present

## 2024-02-05 DIAGNOSIS — H547 Unspecified visual loss: Secondary | ICD-10-CM | POA: Diagnosis not present

## 2024-02-05 DIAGNOSIS — Z9842 Cataract extraction status, left eye: Secondary | ICD-10-CM | POA: Diagnosis not present

## 2024-02-05 DIAGNOSIS — Z992 Dependence on renal dialysis: Secondary | ICD-10-CM | POA: Diagnosis not present

## 2024-02-06 ENCOUNTER — Encounter: Payer: Self-pay | Admitting: Podiatry

## 2024-02-06 ENCOUNTER — Ambulatory Visit: Admitting: Podiatry

## 2024-02-06 DIAGNOSIS — Q828 Other specified congenital malformations of skin: Secondary | ICD-10-CM

## 2024-02-06 DIAGNOSIS — R310 Gross hematuria: Secondary | ICD-10-CM | POA: Diagnosis not present

## 2024-02-06 DIAGNOSIS — M79674 Pain in right toe(s): Secondary | ICD-10-CM | POA: Diagnosis not present

## 2024-02-06 DIAGNOSIS — E1122 Type 2 diabetes mellitus with diabetic chronic kidney disease: Secondary | ICD-10-CM

## 2024-02-06 DIAGNOSIS — B351 Tinea unguium: Secondary | ICD-10-CM

## 2024-02-06 DIAGNOSIS — Z794 Long term (current) use of insulin: Secondary | ICD-10-CM

## 2024-02-06 DIAGNOSIS — Z992 Dependence on renal dialysis: Secondary | ICD-10-CM

## 2024-02-06 DIAGNOSIS — N201 Calculus of ureter: Secondary | ICD-10-CM | POA: Diagnosis not present

## 2024-02-06 DIAGNOSIS — M79675 Pain in left toe(s): Secondary | ICD-10-CM

## 2024-02-06 DIAGNOSIS — N400 Enlarged prostate without lower urinary tract symptoms: Secondary | ICD-10-CM | POA: Diagnosis not present

## 2024-02-06 DIAGNOSIS — N186 End stage renal disease: Secondary | ICD-10-CM | POA: Diagnosis not present

## 2024-02-06 NOTE — Progress Notes (Signed)
 This patient presents  to my office for at risk foot care.  This patient requires this care by a professional since this patient will be at risk due to having  CKD, coagulation defect and DM.  This patient is unable to cut nails himself since the patient cannot reach his nails.These nails are painful walking and wearing shoes. Marcus Johnson  He presents to the office with his brother.  This patient presents for at risk foot care today.  General Appearance  Alert, conversant and in no acute stress.  Vascular  Dorsalis pedis and posterior tibial  pulses are absent  bilaterally.  Capillary return is within normal limits  bilaterally. Temperature is within normal limits  bilaterally.  Neurologic  Senn-Weinstein monofilament wire test absent  bilaterally. Muscle power within normal limits bilaterally.  Nails Thick disfigured discolored nails with subungual debris  from hallux to fifth toes bilaterally. No evidence of bacterial infection or drainage bilaterally.  Orthopedic  No limitations of motion  feet .  No crepitus or effusions noted.  No bony pathology or digital deformities noted.  No  STJ ROM left foot.  Skin  normotropic skin with no porokeratosis noted bilaterally.  No signs of infections or ulcers noted.   Callus lateral aspect fifth metabase left foot symptomatic.  Callus plantar aspect right foot.  Onychomycosis  Pain in right toes  Pain in left toes  Debride callus with dremel tool.  Consent was obtained for treatment procedures.   Mechanical debridement of nails 1-5  bilaterally performed with a nail nipper.  Filed with dremel without incident.   Marcus Johnson Cordella Bold DPM

## 2024-02-07 DIAGNOSIS — N2581 Secondary hyperparathyroidism of renal origin: Secondary | ICD-10-CM | POA: Diagnosis not present

## 2024-02-07 DIAGNOSIS — Z992 Dependence on renal dialysis: Secondary | ICD-10-CM | POA: Diagnosis not present

## 2024-02-07 DIAGNOSIS — N186 End stage renal disease: Secondary | ICD-10-CM | POA: Diagnosis not present

## 2024-02-10 ENCOUNTER — Other Ambulatory Visit: Payer: Self-pay

## 2024-02-10 ENCOUNTER — Ambulatory Visit (HOSPITAL_COMMUNITY): Payer: Self-pay | Admitting: Pharmacist

## 2024-02-10 DIAGNOSIS — N2581 Secondary hyperparathyroidism of renal origin: Secondary | ICD-10-CM | POA: Diagnosis not present

## 2024-02-10 DIAGNOSIS — Z01818 Encounter for other preprocedural examination: Secondary | ICD-10-CM

## 2024-02-10 DIAGNOSIS — N186 End stage renal disease: Secondary | ICD-10-CM | POA: Diagnosis not present

## 2024-02-10 DIAGNOSIS — Z992 Dependence on renal dialysis: Secondary | ICD-10-CM | POA: Diagnosis not present

## 2024-02-10 LAB — POCT INR: INR: 2.3 (ref 2.0–3.0)

## 2024-02-11 ENCOUNTER — Other Ambulatory Visit (HOSPITAL_COMMUNITY): Payer: Self-pay

## 2024-02-11 ENCOUNTER — Ambulatory Visit (HOSPITAL_COMMUNITY)
Admission: RE | Admit: 2024-02-11 | Discharge: 2024-02-11 | Disposition: A | Discharge status: Home / Self Care | Source: Ambulatory Visit

## 2024-02-11 ENCOUNTER — Other Ambulatory Visit (HOSPITAL_COMMUNITY)

## 2024-02-11 ENCOUNTER — Other Ambulatory Visit: Payer: Self-pay

## 2024-02-11 ENCOUNTER — Ambulatory Visit (HOSPITAL_COMMUNITY)

## 2024-02-11 DIAGNOSIS — M109 Gout, unspecified: Secondary | ICD-10-CM | POA: Diagnosis not present

## 2024-02-11 DIAGNOSIS — D696 Thrombocytopenia, unspecified: Secondary | ICD-10-CM | POA: Insufficient documentation

## 2024-02-11 DIAGNOSIS — I132 Hypertensive heart and chronic kidney disease with heart failure and with stage 5 chronic kidney disease, or end stage renal disease: Secondary | ICD-10-CM | POA: Insufficient documentation

## 2024-02-11 DIAGNOSIS — E1142 Type 2 diabetes mellitus with diabetic polyneuropathy: Secondary | ICD-10-CM | POA: Diagnosis not present

## 2024-02-11 DIAGNOSIS — Z7901 Long term (current) use of anticoagulants: Secondary | ICD-10-CM | POA: Diagnosis not present

## 2024-02-11 DIAGNOSIS — I4891 Unspecified atrial fibrillation: Secondary | ICD-10-CM | POA: Diagnosis not present

## 2024-02-11 DIAGNOSIS — Z992 Dependence on renal dialysis: Secondary | ICD-10-CM | POA: Insufficient documentation

## 2024-02-11 DIAGNOSIS — Z95811 Presence of heart assist device: Secondary | ICD-10-CM | POA: Diagnosis not present

## 2024-02-11 DIAGNOSIS — N186 End stage renal disease: Secondary | ICD-10-CM | POA: Insufficient documentation

## 2024-02-11 DIAGNOSIS — Z1379 Encounter for other screening for genetic and chromosomal anomalies: Secondary | ICD-10-CM | POA: Insufficient documentation

## 2024-02-11 DIAGNOSIS — Z01818 Encounter for other preprocedural examination: Secondary | ICD-10-CM

## 2024-02-11 DIAGNOSIS — E1122 Type 2 diabetes mellitus with diabetic chronic kidney disease: Secondary | ICD-10-CM | POA: Diagnosis not present

## 2024-02-11 DIAGNOSIS — C801 Malignant (primary) neoplasm, unspecified: Secondary | ICD-10-CM

## 2024-02-11 DIAGNOSIS — D649 Anemia, unspecified: Secondary | ICD-10-CM | POA: Diagnosis not present

## 2024-02-11 DIAGNOSIS — I5022 Chronic systolic (congestive) heart failure: Secondary | ICD-10-CM | POA: Diagnosis not present

## 2024-02-11 DIAGNOSIS — D6489 Other specified anemias: Secondary | ICD-10-CM | POA: Diagnosis not present

## 2024-02-11 DIAGNOSIS — I252 Old myocardial infarction: Secondary | ICD-10-CM | POA: Diagnosis not present

## 2024-02-11 LAB — GLUCOSE, CAPILLARY
Glucose-Capillary: 94 mg/dL (ref 70–99)
Glucose-Capillary: 93 mg/dL (ref 70–99)

## 2024-02-11 LAB — CBC WITH DIFFERENTIAL/PLATELET
Abs Immature Granulocytes: 0.02 K/uL (ref 0.00–0.07)
Basophils Absolute: 0.1 K/uL (ref 0.0–0.1)
Basophils Relative: 1 %
Eosinophils Absolute: 0.2 K/uL (ref 0.0–0.5)
Eosinophils Relative: 2 %
HCT: 34.3 % — ABNORMAL LOW (ref 39.0–52.0)
Hemoglobin: 11.1 g/dL — ABNORMAL LOW (ref 13.0–17.0)
Immature Granulocytes: 0 %
Lymphocytes Relative: 24 %
Lymphs Abs: 1.8 K/uL (ref 0.7–4.0)
MCH: 29.8 pg (ref 26.0–34.0)
MCHC: 32.4 g/dL (ref 30.0–36.0)
MCV: 92.2 fL (ref 80.0–100.0)
Monocytes Absolute: 0.8 K/uL (ref 0.1–1.0)
Monocytes Relative: 10 %
Neutro Abs: 4.7 K/uL (ref 1.7–7.7)
Neutrophils Relative %: 63 %
Platelets: 145 K/uL — ABNORMAL LOW (ref 150–400)
RBC: 3.72 MIL/uL — ABNORMAL LOW (ref 4.22–5.81)
RDW: 18.4 % — ABNORMAL HIGH (ref 11.5–15.5)
WBC: 7.6 K/uL (ref 4.0–10.5)
nRBC: 0 % (ref 0.0–0.2)

## 2024-02-11 MED ORDER — SODIUM CHLORIDE 0.9 % IV SOLN: INTRAVENOUS | Status: DC

## 2024-02-11 MED ORDER — MIDAZOLAM HCL 2 MG/2ML IJ SOLN
INTRAMUSCULAR | Status: AC
  Filled 2024-02-11: qty 4

## 2024-02-11 MED ORDER — FENTANYL CITRATE (PF) 100 MCG/2ML IJ SOLN
INTRAMUSCULAR | Status: DC | PRN
  Administered 2024-02-11: 25 ug via INTRAVENOUS
  Administered 2024-02-11: 50 ug via INTRAVENOUS

## 2024-02-11 MED ORDER — FENTANYL CITRATE (PF) 100 MCG/2ML IJ SOLN
INTRAMUSCULAR | Status: AC
Start: 2024-02-11 — End: 2024-02-11
  Filled 2024-02-11: qty 2

## 2024-02-11 MED ORDER — LIDOCAINE HCL (PF) 1 % IJ SOLN
10.0000 mL | Freq: Once | INTRAMUSCULAR | Status: AC
  Administered 2024-02-11: 10 mL
  Filled 2024-02-11: qty 10

## 2024-02-11 MED ORDER — MIDAZOLAM HCL 2 MG/2ML IJ SOLN
INTRAMUSCULAR | Status: DC | PRN
Start: 2024-02-11 — End: 2024-02-12
  Administered 2024-02-11 (×2): 1 mg via INTRAVENOUS

## 2024-02-11 NOTE — Procedures (Signed)
 Interventional Radiology Procedure Note  Procedure: CT RT ILIAC BM ASP AND CORE    Complications: None  Estimated Blood Loss:  THROMBOCYTOPENIA  Findings: 11 G ASP AND CORE    EMERSON FREDERIC SPECKING, MD
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
## 2024-02-11 NOTE — Progress Notes (Signed)
 VAD Coordinator Procedure Note:   VAD Coordinator met patient in IR. Pt undergoing right iliac bone marrow aspiration and core per Dr. Vanice. Hemodynamics and VAD parameters monitored by myself and IR RN throughout the procedure. Blood pressures were obtained with automatic cuff on left arm.    Time: Doppler Auto  BP Flow PI Power Speed  Pre-procedure:  0915  118/68 (86) 5.0 3.2 4.5 5700   0945  108/79 (87) 4.9 3.2 4.6 5700           Sedation Induction: 0948  98/62 (70) 4.9 3.1 4.6 5700   1000  95/81 (88) 5.6 2.4 4.6 5700                    Recovery Area: 1005  106/80 (89) 5.1 3.2 4.6 5700             Patient Disposition: Patient tolerated the procedure well. VAD Coordinator accompanied and remained with patient in recovery area.   Isaiah Knoll RN VAD Coordinator  Office: [phone]  24/7 Pager: [phone]

## 2024-02-12 DIAGNOSIS — Z992 Dependence on renal dialysis: Secondary | ICD-10-CM | POA: Diagnosis not present

## 2024-02-12 DIAGNOSIS — N2581 Secondary hyperparathyroidism of renal origin: Secondary | ICD-10-CM | POA: Diagnosis not present

## 2024-02-12 DIAGNOSIS — N186 End stage renal disease: Secondary | ICD-10-CM | POA: Diagnosis not present

## 2024-02-13 LAB — SURGICAL PATHOLOGY

## 2024-02-14 DIAGNOSIS — N186 End stage renal disease: Secondary | ICD-10-CM | POA: Diagnosis not present

## 2024-02-14 DIAGNOSIS — N2581 Secondary hyperparathyroidism of renal origin: Secondary | ICD-10-CM | POA: Diagnosis not present

## 2024-02-14 DIAGNOSIS — Z992 Dependence on renal dialysis: Secondary | ICD-10-CM | POA: Diagnosis not present

## 2024-02-17 DIAGNOSIS — N186 End stage renal disease: Secondary | ICD-10-CM | POA: Diagnosis not present

## 2024-02-17 DIAGNOSIS — Z992 Dependence on renal dialysis: Secondary | ICD-10-CM | POA: Diagnosis not present

## 2024-02-17 DIAGNOSIS — N2581 Secondary hyperparathyroidism of renal origin: Secondary | ICD-10-CM | POA: Diagnosis not present

## 2024-02-18 ENCOUNTER — Ambulatory Visit (HOSPITAL_COMMUNITY): Payer: Self-pay | Admitting: Pharmacist

## 2024-02-18 LAB — POCT INR: INR: 2.3 (ref 2.0–3.0)

## 2024-02-19 DIAGNOSIS — N2581 Secondary hyperparathyroidism of renal origin: Secondary | ICD-10-CM | POA: Diagnosis not present

## 2024-02-19 DIAGNOSIS — Z992 Dependence on renal dialysis: Secondary | ICD-10-CM | POA: Diagnosis not present

## 2024-02-19 DIAGNOSIS — N186 End stage renal disease: Secondary | ICD-10-CM | POA: Diagnosis not present

## 2024-02-20 ENCOUNTER — Encounter (HOSPITAL_COMMUNITY): Payer: Self-pay

## 2024-02-20 ENCOUNTER — Ambulatory Visit (HOSPITAL_COMMUNITY)
Admission: RE | Admit: 2024-02-20 | Discharge: 2024-02-20 | Disposition: A | Discharge status: Home / Self Care | Source: Ambulatory Visit | Attending: Cardiology | Admitting: Cardiology

## 2024-02-20 DIAGNOSIS — Z95810 Presence of automatic (implantable) cardiac defibrillator: Secondary | ICD-10-CM | POA: Insufficient documentation

## 2024-02-20 DIAGNOSIS — E1142 Type 2 diabetes mellitus with diabetic polyneuropathy: Secondary | ICD-10-CM | POA: Diagnosis not present

## 2024-02-20 DIAGNOSIS — I4891 Unspecified atrial fibrillation: Secondary | ICD-10-CM | POA: Insufficient documentation

## 2024-02-20 DIAGNOSIS — Z7901 Long term (current) use of anticoagulants: Secondary | ICD-10-CM | POA: Insufficient documentation

## 2024-02-20 DIAGNOSIS — I255 Ischemic cardiomyopathy: Secondary | ICD-10-CM | POA: Insufficient documentation

## 2024-02-20 DIAGNOSIS — Z79899 Other long term (current) drug therapy: Secondary | ICD-10-CM | POA: Diagnosis not present

## 2024-02-20 DIAGNOSIS — Z8616 Personal history of COVID-19: Secondary | ICD-10-CM | POA: Diagnosis not present

## 2024-02-20 DIAGNOSIS — I132 Hypertensive heart and chronic kidney disease with heart failure and with stage 5 chronic kidney disease, or end stage renal disease: Secondary | ICD-10-CM | POA: Insufficient documentation

## 2024-02-20 DIAGNOSIS — I2510 Atherosclerotic heart disease of native coronary artery without angina pectoris: Secondary | ICD-10-CM | POA: Diagnosis not present

## 2024-02-20 DIAGNOSIS — Z951 Presence of aortocoronary bypass graft: Secondary | ICD-10-CM | POA: Insufficient documentation

## 2024-02-20 DIAGNOSIS — T827XXA Infection and inflammatory reaction due to other cardiac and vascular devices, implants and grafts, initial encounter: Secondary | ICD-10-CM | POA: Insufficient documentation

## 2024-02-20 DIAGNOSIS — Z992 Dependence on renal dialysis: Secondary | ICD-10-CM | POA: Insufficient documentation

## 2024-02-20 DIAGNOSIS — I5022 Chronic systolic (congestive) heart failure: Secondary | ICD-10-CM | POA: Diagnosis not present

## 2024-02-20 DIAGNOSIS — Z794 Long term (current) use of insulin: Secondary | ICD-10-CM | POA: Insufficient documentation

## 2024-02-20 DIAGNOSIS — E1122 Type 2 diabetes mellitus with diabetic chronic kidney disease: Secondary | ICD-10-CM | POA: Insufficient documentation

## 2024-02-20 DIAGNOSIS — Z95811 Presence of heart assist device: Secondary | ICD-10-CM | POA: Insufficient documentation

## 2024-02-20 DIAGNOSIS — D696 Thrombocytopenia, unspecified: Secondary | ICD-10-CM | POA: Diagnosis not present

## 2024-02-20 DIAGNOSIS — I4892 Unspecified atrial flutter: Secondary | ICD-10-CM | POA: Insufficient documentation

## 2024-02-20 DIAGNOSIS — R319 Hematuria, unspecified: Secondary | ICD-10-CM | POA: Diagnosis not present

## 2024-02-20 DIAGNOSIS — M79641 Pain in right hand: Secondary | ICD-10-CM | POA: Diagnosis not present

## 2024-02-20 DIAGNOSIS — I5023 Acute on chronic systolic (congestive) heart failure: Secondary | ICD-10-CM | POA: Diagnosis not present

## 2024-02-20 DIAGNOSIS — N186 End stage renal disease: Secondary | ICD-10-CM | POA: Insufficient documentation

## 2024-02-20 NOTE — Patient Instructions (Signed)
 No medication changes Follow up in 2 months with Dr. Rolan Echo prior

## 2024-02-20 NOTE — Progress Notes (Addendum)
 Patient presents for 2 month follow up in VAD clinic with his brother Marcus Johnson. Reports no problems with VAD equipment or concerns with drive line.  Patient arrived via wheelchair today but was able to step on the scale for his weight. Pt denies dizziness, falls, and heart failure symptoms. Complains of intermittent lightheadedness and fatigue on dialysis days. Still experiencing some intermittent nausea on HD days, but this has improved with Zofran .  Pt reports ongoing intermittent hematuria about once per week. Continues to follow with Urology. Recently started on Finasteride 5 mg daily.   Pt had cataract removal in right eye at [facility] 12/19/23. Returns to see ophthalmology next week to discuss left eye cataract.   Pt has not had any recent ICD shocks and is taking his Amiodarone  200 BID and Mexiletine 150 mg BID as prescribed. Will send orders to [facility] to draw Thyroid  Panel and LFT's per Dr. Rolan.   Patient reports he continues M/W/F dialysis. Pt confirms he takes Midodrine  TID on HD days only.   Pt on Keflex  500 mg BID (dosed for HD schedule). Marcus Johnson reports they are changing dressing weekly. Currently wearing 2 anchors. Reports he wears binder at home, but does not like to wear during dialysis.   Dry weight at HD: 83 kg. Pt states they have been pulling 4L the last two HD session due to a decrease in urine output.  Platelet level has been trending down over the last few months. Platelets 132 as of 02/12/24. Pt follows with Hematology and under went bone marrow biopsy 02/11/24.  Vital Signs:  Doppler Pressure: 104 Automatic BP: 103/93 (98) HR: 75 NSR O2: 97% RA   Weight: 189.4 lbs w/ eqt Last wt: 189.2 w/ eqt   VAD Indication: Destination Therapy due to CKD Stage 3b   LVAD assessment:  HM III: Speed: 5700 rpms Flow: 5.3 Power: 4.6 w    PI: 3.2 Alarms: none Events: 0 - 5  Fixed speed:  5700 Low speed limit:  5400  Primary controller: Back up battery due for  replacement in 16 months Secondary controller: Back up battery due for replacement in 28 months  I reviewed the LVAD parameters from today and compared the results to the patient's prior recorded data. LVAD interrogation was NEGATIVE for significant power changes, NEGATIVE for clinical alarms and STABLE for PI events/speed drops. No programming changes were made and pump is functioning within specified parameters. Pt is performing daily controller and system monitor self tests along with completing weekly and monthly maintenance for LVAD equipment.   LVAD equipment check completed and is in good working order. Back-up equipment present and back up battery charged at this appointment.   Annual Equipment Maintenance on UBC/PM was performed 10/29/23.  Exit Site Care: Existing VAD dressing maintained by Marcus Johnson weekly. Dressing clean, dry, and intact. Anchor correctly applied. Denies fever or chills. Provided 12 anchors and a roll of medipore tape for home use.   Device: Medtronic single Therapies: VF: 240 VT: 200 Slow VT: 143 Pacing: VVI 40 Last check: 01/07/24   BP & Labs: Labs from 02/12/24 Doppler 104 - Doppler is reflecting modified systolic  Hgb 11.2 - No S/S of bleeding. Specifically denies melena/BRBPR or nosebleeds.   LDH 214- established baseline of 160 - 180. Denies tea-colored urine. No power elevations noted on interrogation.   Plan: No medication changes Follow up in 2 months with Dr. Rolan Echo prior  Schuyler Lunger RN, BSN VAD Coordinator 24/7 Pager [phone]  Follow up for Heart Failure/LVAD:  Chief complaint: LVAD followup  Marcus Johnson. is a 63 y.o. male who has a h/o DM2, CAD s/p CABG 2002, systolic HF due to ischemic cardiomyopathy with EF 20-25% (echo 12/15), DM2 and CKD. He is s/p Medtronic single chamber ICD.   Admitted in 12/15 due to ADHF. Required short course milrinone  for diuresis. Diuresed 30 pounds.    CPX 2/16 showed severely reduced  functional capacity.  There was a significant disconnect between his symptoms and the CPX.  RHC in 6/16 showed fairly normal filling pressures and low but not markedly low cardiac output.    CPX (4/19) was submaximal but suggested severe limitation from HF.    He was admitted in 6/19 with marked volume overload and dyspnea.  Echo in 6/19 showed EF 20% with severe global hypokinesis.  LHC/RHC showed severe native vessel CAD with patent LIMA-D, SVG-OM, and SVG-PLV; cardiac index low.  He was started on milrinone  and diuresed.  We discussed LVAD extensively in light of low output, creatinine that is trending up and concern for decompensation of RV over time. He was adamant that he did not want to pursue LVAD workup yet and did not want a referral for transplant evaluation. Milrinone  was weaned off and he was discharged home.    Admitted 08/01/18-08/07/18 with volume overload and left foot wound. Diuresed 38 lbs with IV lasix  and then transitioned back to torsemide  100 mg BID. ID was consulted with concerns for left foot osteomyelitis noted on CT scan. He got IV ancef  q8 hours x 42 days. [facility] provided teaching and supplies for home antibiotics. Course complicated by AKI. DC weight 200 lbs.    He went into atrial flutter and had DCCV to NSR in 1/20.    Echo in 4/21 showed EF 25% with apical akinesis and diffuse hypokinesis relatively preserved in lateral wall, mildly decreased RV systolic function, PASP 41 mmHg. CPX in 5/21 showed severe functional limitation suggestive of advanced HF. 5/21 Zio patch showed short atrial flutter runs, 3.8% PVCs.    He developed COVID-19 infection in 1/22.  Symptoms were predominantly GI.    He developed progressive worsening of CHF in early 2022 and was admitted in 3/22 with low output HF.  Impella 5.5 was placed as bridge to Heartmate III LVAD placed in 10/13/20.  He developed post-op renal failure and ended up on dialysis.  He was markedly deconditioned post-op and went to  CIR.    Still has pain and minimal function in his right hand.  Nerve conduction study showed a chronic sensorimotor neuropathy in the right arm.    Ramp echo in 3/23 showed severe LV dysfunction with mild RV enlargement and mild dysfunction, mild-moderate AI.  Speed was increased to 5800 rpm.  The aortic valve still opened with every beat but only slightly. Flow increased from 4.1 to 5.3 L/min and PI remained stable.   Patient was admitted in 4/23 with falls and lightheadedness. He fractured his left foot when falling and initially was unable to walk.  He was thought to be hypotensive due to over-diuresis and dry weight was increased.  Midodrine  on HD days was also increased.  He was treated for a driveline infection with vancomycin  then doxycycline , now off.   ICD shock for VT in 7/23, not started on amiodarone  given only 1 episode.   Echo in 3/24 showed EF 20-25% with midline IV septum, mild RV dysfunction with severe RVE, moderate AI.   In 5/24,  patient had VT treated with ATP.      Patient was shocked by his ICD for VT on 04/17/23, 04/20/23, and 04/21/23.  I had him increase his amiodarone  to 200 mg bid x 2 wks then back to 200 mg daily.  After discussion with Dr. Fernande, I started him on mexiletine as well.    Ramp echo was in 9/24.  Starting speed 5800 rpm with moderate AI, slight aortic valve opening each beat, moderate RV enlargement/moderate RV dysfunction, septum shifted slightly to the left.  Speed decreased to 5700 rpm with moderate AI, aortic valve opening each beat, midline septum with flow fairly stable at 5 L/min.  I left speed at 5700 rpm.   With chronic thrombocytopenia, patient had hematology workup including bone marrow biopsy. The biopsy was unremarkable and platelets have trended back up.    He is also seeing urology for workup of hematuria.  Nephrolithiasis has been found.   Patient returns for followup of LVAD.  At last visit, I started him on hydralazine  for hypertension  on non-HD days. He tolerated this poorly (lightheadedness) so it was stopped. He still takes midodrine  pre-HD.  Still having periodic hematuria, ongoing workup with urology.  Weight is stable.  He denies dyspnea but fatigues easily.  No lightheadedness.    Labs (3/24): LDH 222, hgb 11.2 Labs (6/24): LDH 219, hgb 11.3, TSH normal Labs (9/24): TSH normal, hgb 12, LFTs normal, LDH 215 Labs (1/25): LFTs normal, TSH normal Labs (3/25): LFTs normal, TSH normal, LDH 200, hgb 11.3, plts 115 Labs (6/25): LDH 194, LFTs normal Labs (7/25): hgb 11.2, plts 145 => 132, LDH 214  Past Medical History:  Diagnosis Date   AICD (automatic cardioverter/defibrillator) present    Single-chamber  implantable cardiac defibrillator - Medtronic   Atrial fibrillation (HCC)    Cataract    Mixed form OU   CHF (congestive heart failure) (HCC)    Chronic kidney disease    Chronic kidney disease (CKD), stage III (moderate) (HCC)    Chronic systolic heart failure (HCC)    a. Echo 5/13: Mild LVE, mild LVH, EF 10%, anteroseptal, lateral, apical AK, mild MR, mild LAE, moderately reduced RVSF, mild RAE, PASP 60;  b. 07/2014 Echo: EF 20-2%, diff HK, AKI of antsep/apical/mid-apicalinferior, mod reduced RV.   Coronary artery disease    a. s/p CABG 2002 b. LHC 5/13:  dLM 80%, LAD subtotally occluded, pCFX occluded, pRCA 50%, mid? Occlusion with high take off of the PDA with 70% multiple lesions-not bypassed and supplies collaterals to LAD, LIMA-IM/ramus ok, S-OM ok, S-PLA branches ok. Medical therapy was recommended   COVID    Diabetic retinopathy (HCC)    NPDR OD, PDR OS   Dyspnea    Gout    on daily RX (01/08/2018)   Hypertension    Hypertensive retinopathy    OU   Ischemic cardiomyopathy    a. Echo 5/13: Mild LVE, mild LVH, EF 10%, anteroseptal, lateral, apical AK, mild MR, mild LAE, moderately reduced RVSF, mild RAE, PASP 60;  b. 01/2012 s/p MDT [id number] Protecta XT VR AICD;  c. 07/2014 Echo: EF 20-2%, diff HK, AKI of  antsep/apical/mid-apicalinferior, mod reduced RV.   MRSA (methicillin resistant Staphylococcus aureus)    Status post right foot plantar deep infection with MRSA status post  I&D 10/2008   Myocardial infarction [facility])    was told I'd had several before heart OR in 2002 (01/08/2018)   Noncompliance    Peripheral neuropathy    Retinopathy,  diabetic, background (HCC)    Syncope    Type II diabetes mellitus (HCC)    requiring insulin     Vitreous hemorrhage, left (HCC)    and proliferative diabetic retinopathy    Current Outpatient Medications  Medication Sig Dispense Refill   acetaminophen  (TYLENOL ) 325 MG tablet Take by mouth.     allopurinol  (ZYLOPRIM ) 300 MG tablet Take by mouth.     amiodarone  (PACERONE ) 200 MG tablet Take 1 tablet (200 mg total) by mouth 2 (two) times daily. Take 1 tablet (200 mg) by mouth twice daily for 2 weeks starting 04/23/23, then decrease to 1 tablet (200 mg) daily 05/07/23. 90 tablet 3   BD PEN NEEDLE NANO 2ND GEN 32G X 4 MM MISC USE TO INJECT INSULIN  DAILY 100 each 0   Blood Glucose Monitoring Suppl (ONE TOUCH ULTRA 2) w/Device KIT 1 Device by Does not apply route 3 (three) times daily. 1 kit 0   cephALEXin  (KEFLEX ) 500 MG capsule Take 500 mg by mouth 2 (two) times daily.     finasteride (PROSCAR) 5 MG tablet Take 5 mg by mouth daily.     gabapentin  (NEURONTIN ) 100 MG capsule TAKE 1 CAPSULE ( 100 MG) IN THE MORNING AND 2 CAPSULES ( 200 MG) AT BEDTIME 90 capsule 3   glucose blood (ACCU-CHEK GUIDE) test strip USE AS INSTRUCTED 3 TIMES DAILY 100 strip 1   insulin  glargine (LANTUS  SOLOSTAR) 100 UNIT/ML Solostar Pen Inject 60 Units into the skin at bedtime. 30 mL 11   ketorolac (ACULAR) 0.5 % ophthalmic solution Place 1 drop into the left eye 4 (four) times daily. (Patient taking differently: Place 1 drop into the left eye 4 (four) times daily.)     mexiletine (MEXITIL) 150 MG capsule Take 1 capsule (150 mg total) by mouth 2 (two) times daily. 180 capsule 3   midodrine   (PROAMATINE ) 10 MG tablet TAKE 2 TABLETS 3 TIMES DAILY ON DIALYSIS DAYS NO MIDODRINE  ON NON DIALYSIS DAYS 240 tablet 7   moxifloxacin (VIGAMOX) 0.5 % ophthalmic solution Apply 1 drop to eye 3 (three) times daily. (Patient taking differently: Apply 1 drop to eye 3 (three) times daily.)     multivitamin (RENA-VIT) TABS tablet TAKE 1 TABLET BY MOUTH EVERYDAY AT BEDTIME 90 tablet 3   ondansetron  (ZOFRAN ) 4 MG tablet Take 1 tablet (4 mg total) by mouth every 8 (eight) hours as needed for nausea or vomiting. 30 tablet 3   OneTouch UltraSoft 2 Lancets MISC 1 each by Does not apply route 3 (three) times daily before meals. 100 each 1   pantoprazole  (PROTONIX ) 40 MG tablet TAKE 1 TABLET BY MOUTH EVERY DAY 90 tablet 3   polyethylene glycol powder (GLYCOLAX /MIRALAX ) 17 GM/SCOOP powder      prednisoLONE  acetate (PRED FORTE ) 1 % ophthalmic suspension 1 drop 4 (four) times daily. (Patient taking differently: 1 drop 4 (four) times daily.)     rosuvastatin  (CRESTOR ) 10 MG tablet Take 1 tablet (10 mg total) by mouth daily. 90 tablet 3   traMADol  (ULTRAM ) 50 MG tablet TAKE 1 TABLET BY MOUTH EVERY 12 HOURS AS NEEDED 30 tablet 3   warfarin (COUMADIN ) 3 MG tablet TAKE 4.5 MG (1.5 TABS) DAILY OR AS DIRECTED BY HF CLINIC 150 tablet 3   naphazoline-glycerin  (CLEAR EYES REDNESS) 0.012-0.25 % SOLN Place 1-2 drops into the right eye 4 (four) times daily as needed for eye irritation. (Patient not taking: Reported on 02/20/2024)     sevelamer (RENAGEL) 800 MG tablet Take 800  mg by mouth 3 (three) times daily. (Patient not taking: Reported on 02/20/2024)     No current facility-administered medications for this encounter.    Meclizine  hcl and Ivabradine   REVIEW OF SYSTEMS: All systems negative except as listed in HPI, PMH and Problem list.  LVAD INTERROGATION:  Please see LVAD nurse's note above, I reviewed parameters (stable).  Flow 5.1, about 15 PI events/day (chronic).   Physical Exam: MAP 98 General: NAD Neck: JVP  8-9 cm, no thyromegaly or thyroid  nodule.  Lungs: Clear to auscultation bilaterally with normal respiratory effort. CV: Nondisplaced PMI.  Heart regular S1/S2, no S3/S4, no murmur.  No peripheral edema.  No carotid bruit.  Normal pedal pulses.  Abdomen: Soft, nontender, no hepatosplenomegaly, no distention.  Skin: Intact without lesions or rashes.  Neurologic: Alert and oriented x 3.  Psych: Normal affect. Extremities: No clubbing or cyanosis.  HEENT: Normal.   ASSESSMENT AND PLAN:  1. Chronic systolic CHF:  Ischemic cardiomyopathy. Has Medtronic ICD. Low output HF, admitted and Impella 5.5 placed 10/04/20.  HM3 LVAD was placed on 10/13/20. Developed post-op renal failure requiring eventual hemodialysis.  Ramp echo in 3/23, speed increased to 5800 rpm under echo guidance with increase in flow and stable PI.  Echo in 3/24 showed EF 20-25% with midline IV septum, mild RV dysfunction with severe RVE, moderate AI. Ramp echo in 9/24 with decrease in speed in setting of VT episodes.  MAP elevated on non-HD days, takes midodrine  on HD days. No further VT.  NYHA class II-III symptoms, mild volume overload.  LVAD interrogation shows rare PI events, no low flows/suction.  - Continue midodrine  on HD days and reminded him not to take midodrine  on the non-HD days.    - He did not tolerate hydralazine  on non-HD days for elevated MAP.  - I will arrange for echo at next appt.  2. LVAD:  VAD interrogated personally.  Parameters stable with a few PI events on HD days. No low flow events or suction.  - He is now off ASA.  - Continue warfarin with INR goal 2-2.5.   3. CAD s/p CABG 2002:  Last cath in 6/19 with patent grafts. No chest pain.  - Continue Crestor   4. ESRD: Currently dialyzing via tunneled catheter. No low flow events.  - Continue HD M-W-F  - With continuous flow LVAD, think AV fistula would be unlikely to mature.   5. Atrial flutter/fibrillation: s/p DC-CV 08/21/18.  Has occasional episodes of AF but  not prolonged.  - Continue amiodarone , check LFTs and TSH.  He will need regular eye exam.  6. 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 This has been improving though not completely normal.   7. DM: Per primary care.  8. Anemia: CBC has been stable, no BRBPR/melena.  9. Suspected Gastroparesis: He is no longer taking Reglan .  10. VT: Episode in 7/23 with successful ICD discharge.  No definite trigger, he did not actually feel the VT. VT again in 5/24 treated with ATP, amiodarone  started.  Episodes of VT 9/11, 9/14, and 04/21/23.  Ramp echo in 9/24 with decreased speed.  No further VT.  - Continue amiodarone  200 mg daily.  Check LFTs/TSH as above.  - Continue mexiletine 150 mg bid.  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 Driveline site looks good today.  12. Thrombocytopenia: Chronic.  Platelets trending up recently.  Bone marrow biopsy unremarkable.   - Sees  hematology.  13. Hematuria: ?from nephrolithiasis.  Ongoing urology workup.   Followup 2 months.   I spent 41 minutes reviewing records, interviewing/examining patient, and managing orders.    Ezra Shuck 02/20/2024

## 2024-02-21 DIAGNOSIS — Z992 Dependence on renal dialysis: Secondary | ICD-10-CM | POA: Diagnosis not present

## 2024-02-21 DIAGNOSIS — N186 End stage renal disease: Secondary | ICD-10-CM | POA: Diagnosis not present

## 2024-02-21 DIAGNOSIS — N2581 Secondary hyperparathyroidism of renal origin: Secondary | ICD-10-CM | POA: Diagnosis not present

## 2024-02-24 DIAGNOSIS — Z992 Dependence on renal dialysis: Secondary | ICD-10-CM | POA: Diagnosis not present

## 2024-02-24 DIAGNOSIS — N2581 Secondary hyperparathyroidism of renal origin: Secondary | ICD-10-CM | POA: Diagnosis not present

## 2024-02-24 DIAGNOSIS — N186 End stage renal disease: Secondary | ICD-10-CM | POA: Diagnosis not present
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
## 2024-02-25 ENCOUNTER — Encounter (INDEPENDENT_AMBULATORY_CARE_PROVIDER_SITE_OTHER): Payer: Self-pay | Admitting: Ophthalmology

## 2024-02-25 ENCOUNTER — Ambulatory Visit (HOSPITAL_COMMUNITY): Payer: Self-pay | Admitting: Pharmacist

## 2024-02-25 ENCOUNTER — Ambulatory Visit (INDEPENDENT_AMBULATORY_CARE_PROVIDER_SITE_OTHER): Admitting: Ophthalmology

## 2024-02-25 DIAGNOSIS — H25811 Combined forms of age-related cataract, right eye: Secondary | ICD-10-CM

## 2024-02-25 DIAGNOSIS — E113513 Type 2 diabetes mellitus with proliferative diabetic retinopathy with macular edema, bilateral: Secondary | ICD-10-CM

## 2024-02-25 DIAGNOSIS — Z961 Presence of intraocular lens: Secondary | ICD-10-CM | POA: Diagnosis not present

## 2024-02-25 DIAGNOSIS — Z794 Long term (current) use of insulin: Secondary | ICD-10-CM | POA: Diagnosis not present

## 2024-02-25 DIAGNOSIS — H4312 Vitreous hemorrhage, left eye: Secondary | ICD-10-CM | POA: Diagnosis not present

## 2024-02-25 DIAGNOSIS — H35033 Hypertensive retinopathy, bilateral: Secondary | ICD-10-CM

## 2024-02-25 DIAGNOSIS — Z7901 Long term (current) use of anticoagulants: Secondary | ICD-10-CM | POA: Diagnosis not present

## 2024-02-25 DIAGNOSIS — I10 Essential (primary) hypertension: Secondary | ICD-10-CM

## 2024-02-25 LAB — POCT INR: INR: 1.8 — AB (ref 2.0–3.0)

## 2024-02-25 MED ORDER — BEVACIZUMAB CHEMO INJECTION 1.25MG/0.05ML SYRINGE FOR KALEIDOSCOPE
1.2500 mg | INTRAVITREAL | Status: AC | PRN
  Administered 2024-02-25: 1.25 mg via INTRAVITREAL

## 2024-02-26 DIAGNOSIS — Z992 Dependence on renal dialysis: Secondary | ICD-10-CM | POA: Diagnosis not present

## 2024-02-26 DIAGNOSIS — N2581 Secondary hyperparathyroidism of renal origin: Secondary | ICD-10-CM | POA: Diagnosis not present

## 2024-02-26 DIAGNOSIS — N186 End stage renal disease: Secondary | ICD-10-CM | POA: Diagnosis not present

## 2024-02-28 DIAGNOSIS — N186 End stage renal disease: Secondary | ICD-10-CM | POA: Diagnosis not present

## 2024-02-28 DIAGNOSIS — N2581 Secondary hyperparathyroidism of renal origin: Secondary | ICD-10-CM | POA: Diagnosis not present

## 2024-02-28 DIAGNOSIS — Z992 Dependence on renal dialysis: Secondary | ICD-10-CM | POA: Diagnosis not present

## 2024-03-01 NOTE — Progress Notes (Unsigned)
 [facility] OFFICE PROGRESS NOTE  Patient Care Team: Johnny Garnette LABOR, MD as PCP - General Rolan Ezra RAMAN, MD as PCP - Advanced Heart Failure (Cardiology) Fernande Elspeth BROCKS, MD as PCP - Electrophysiology (Cardiology) Marlee Bernardino NOVAK, MD (Nephrology)  63 y.o.male with history of ischemic cardiomyopathy with LVAD in place, DM2, neuropathy, gout, ESRD on HD referred to Hematology for thrombocytopenia.   Clinically sometimes with nausea, vomiting at times. He is on warfarin.  Normal folate, copper  and b12.   BM biopsy in July 2025: Normocellular bone marrow (40%) with trilineage hematopoiesis. Discussed with patient today. Discussed signs of severe thrombocytopenia and report to ED if ever occur. He is also on anticoagulation.  Assessment & Plan Thrombocytopenia (HCC) No concerning etiology from marrow. Improving. Not at a level of concern.  Will follow up as needed.    Pauletta BROCKS [place]ihuahua, MD  INTERVAL HISTORY: Patient returns for follow-up. Doing well with cardiology and getting through HD.   PHYSICAL EXAMINATION:  Vitals:   03/02/24 1503  BP: 118/89  Pulse: 83  Resp: 18  Temp: (!) 97.3 F (36.3 C)  SpO2: 100%   Filed Weights   03/02/24 1503  Weight: 188 lb (85.3 kg)   On wheelchair No apparent petechiae or bruising on exposed skin  Relevant data reviewed during this visit included labs.

## 2024-03-01 NOTE — Assessment & Plan Note (Addendum)
 No concerning etiology from marrow. Improving. Not at a level of concern.

## 2024-03-02 ENCOUNTER — Inpatient Hospital Stay

## 2024-03-02 VITALS — BP 118/89 | HR 83 | Temp 97.3°F | Resp 18 | Ht 70.0 in | Wt 188.0 lb

## 2024-03-02 DIAGNOSIS — N2581 Secondary hyperparathyroidism of renal origin: Secondary | ICD-10-CM | POA: Diagnosis not present

## 2024-03-02 DIAGNOSIS — D696 Thrombocytopenia, unspecified: Secondary | ICD-10-CM | POA: Diagnosis not present

## 2024-03-02 DIAGNOSIS — N186 End stage renal disease: Secondary | ICD-10-CM | POA: Diagnosis not present

## 2024-03-02 DIAGNOSIS — Z992 Dependence on renal dialysis: Secondary | ICD-10-CM | POA: Diagnosis not present

## 2024-03-03 ENCOUNTER — Ambulatory Visit

## 2024-03-03 ENCOUNTER — Ambulatory Visit (HOSPITAL_COMMUNITY): Payer: Self-pay | Admitting: Pharmacist

## 2024-03-03 LAB — POCT INR: INR: 2.1 (ref 2.0–3.0)

## 2024-03-04 DIAGNOSIS — N186 End stage renal disease: Secondary | ICD-10-CM | POA: Diagnosis not present

## 2024-03-04 DIAGNOSIS — N2581 Secondary hyperparathyroidism of renal origin: Secondary | ICD-10-CM | POA: Diagnosis not present

## 2024-03-04 DIAGNOSIS — Z992 Dependence on renal dialysis: Secondary | ICD-10-CM | POA: Diagnosis not present

## 2024-03-05 ENCOUNTER — Other Ambulatory Visit (HOSPITAL_COMMUNITY): Payer: Self-pay | Admitting: Cardiology

## 2024-03-05 DIAGNOSIS — N186 End stage renal disease: Secondary | ICD-10-CM | POA: Diagnosis not present

## 2024-03-05 DIAGNOSIS — I509 Heart failure, unspecified: Secondary | ICD-10-CM | POA: Diagnosis not present

## 2024-03-05 DIAGNOSIS — Z992 Dependence on renal dialysis: Secondary | ICD-10-CM | POA: Diagnosis not present

## 2024-03-05 NOTE — Progress Notes (Signed)
 Remote ICD transmission.

## 2024-03-05 NOTE — Addendum Note (Signed)
 Addended by: VICCI SELLER A on: 03/05/2024 09:41 AM   Modules accepted: Orders

## 2024-03-06 DIAGNOSIS — Z992 Dependence on renal dialysis: Secondary | ICD-10-CM | POA: Diagnosis not present

## 2024-03-06 DIAGNOSIS — N186 End stage renal disease: Secondary | ICD-10-CM | POA: Diagnosis not present

## 2024-03-06 DIAGNOSIS — N2581 Secondary hyperparathyroidism of renal origin: Secondary | ICD-10-CM | POA: Diagnosis not present

## 2024-03-09 DIAGNOSIS — Z992 Dependence on renal dialysis: Secondary | ICD-10-CM | POA: Diagnosis not present

## 2024-03-09 DIAGNOSIS — N2581 Secondary hyperparathyroidism of renal origin: Secondary | ICD-10-CM | POA: Diagnosis not present

## 2024-03-09 DIAGNOSIS — N186 End stage renal disease: Secondary | ICD-10-CM | POA: Diagnosis not present

## 2024-03-10 ENCOUNTER — Other Ambulatory Visit (HOSPITAL_COMMUNITY): Payer: Self-pay

## 2024-03-10 ENCOUNTER — Ambulatory Visit (HOSPITAL_COMMUNITY): Payer: Self-pay | Admitting: Pharmacist

## 2024-03-10 ENCOUNTER — Telehealth (HOSPITAL_COMMUNITY): Payer: Self-pay | Admitting: Pharmacist

## 2024-03-10 ENCOUNTER — Other Ambulatory Visit: Payer: Self-pay | Admitting: Family Medicine

## 2024-03-10 DIAGNOSIS — N201 Calculus of ureter: Secondary | ICD-10-CM | POA: Diagnosis not present

## 2024-03-10 DIAGNOSIS — R310 Gross hematuria: Secondary | ICD-10-CM | POA: Diagnosis not present

## 2024-03-10 DIAGNOSIS — N2889 Other specified disorders of kidney and ureter: Secondary | ICD-10-CM | POA: Diagnosis not present

## 2024-03-10 DIAGNOSIS — K8020 Calculus of gallbladder without cholecystitis without obstruction: Secondary | ICD-10-CM | POA: Diagnosis not present

## 2024-03-10 LAB — POCT INR: INR: 2.3 (ref 2.0–3.0)

## 2024-03-10 NOTE — Telephone Encounter (Signed)
 Advanced Heart Failure Patient Advocate Encounter  Prior Authorization for mexiletine has been approved.  Please note insurance has patients last name listed as Kuehnle Jr, and this can be used to search [facility] request in combination with key [id number].   PA# [id number] Effective dates: 08/07/23 through 08/05/24  Patients co-pay is $19.20/30 days; $57/90 days  Tinnie Redman, PharmD, BCPS, BCCP, CPP Heart Failure Clinic Pharmacist [phone]

## 2024-03-10 NOTE — Telephone Encounter (Signed)
 Patient Advocate Encounter   Received notification from [facility] that prior authorization for mexiletine is required.   PA submitted on CoverMyMeds Key [id number] Status is pending   Will continue to follow.   Tinnie Redman, PharmD, BCPS, BCCP, CPP Heart Failure Clinic Pharmacist [phone]

## 2024-03-11 DIAGNOSIS — Z992 Dependence on renal dialysis: Secondary | ICD-10-CM | POA: Diagnosis not present

## 2024-03-11 DIAGNOSIS — N2581 Secondary hyperparathyroidism of renal origin: Secondary | ICD-10-CM | POA: Diagnosis not present

## 2024-03-11 DIAGNOSIS — N186 End stage renal disease: Secondary | ICD-10-CM | POA: Diagnosis not present

## 2024-03-13 DIAGNOSIS — Z992 Dependence on renal dialysis: Secondary | ICD-10-CM | POA: Diagnosis not present

## 2024-03-13 DIAGNOSIS — N2581 Secondary hyperparathyroidism of renal origin: Secondary | ICD-10-CM | POA: Diagnosis not present

## 2024-03-13 DIAGNOSIS — N186 End stage renal disease: Secondary | ICD-10-CM | POA: Diagnosis not present

## 2024-03-16 DIAGNOSIS — N2581 Secondary hyperparathyroidism of renal origin: Secondary | ICD-10-CM | POA: Diagnosis not present

## 2024-03-16 DIAGNOSIS — Z992 Dependence on renal dialysis: Secondary | ICD-10-CM | POA: Diagnosis not present

## 2024-03-16 DIAGNOSIS — N186 End stage renal disease: Secondary | ICD-10-CM | POA: Diagnosis not present

## 2024-03-17 ENCOUNTER — Ambulatory Visit (HOSPITAL_COMMUNITY): Payer: Self-pay | Admitting: Pharmacist

## 2024-03-17 ENCOUNTER — Telehealth (HOSPITAL_COMMUNITY): Payer: Self-pay | Admitting: Unknown Physician Specialty

## 2024-03-17 LAB — POCT INR: INR: 2.6 (ref 2.0–3.0)

## 2024-03-17 NOTE — Telephone Encounter (Signed)
 Pt called the VAD office this morning stating that he fell yesterday after dialysis. He did not take his midodrine  prior to dialysis so he feels his BP was low. Pt denies hitting his head but states sprung some bruises on my leg. Pt ask if felt he needed xrays of his leg, pt tells me that he wants to wait a day or 2 to see if he feels better. Pt states that he was able to walk around today and is putting wait on this leg.  Lauraine Ip RN, BSN VAD Coordinator 24/7 Pager [phone]

## 2024-03-18 DIAGNOSIS — N186 End stage renal disease: Secondary | ICD-10-CM | POA: Diagnosis not present

## 2024-03-18 DIAGNOSIS — N2581 Secondary hyperparathyroidism of renal origin: Secondary | ICD-10-CM | POA: Diagnosis not present

## 2024-03-18 DIAGNOSIS — Z992 Dependence on renal dialysis: Secondary | ICD-10-CM | POA: Diagnosis not present

## 2024-03-20 DIAGNOSIS — Z992 Dependence on renal dialysis: Secondary | ICD-10-CM | POA: Diagnosis not present

## 2024-03-20 DIAGNOSIS — N2581 Secondary hyperparathyroidism of renal origin: Secondary | ICD-10-CM | POA: Diagnosis not present

## 2024-03-20 DIAGNOSIS — N186 End stage renal disease: Secondary | ICD-10-CM | POA: Diagnosis not present

## 2024-03-23 DIAGNOSIS — N186 End stage renal disease: Secondary | ICD-10-CM | POA: Diagnosis not present

## 2024-03-23 DIAGNOSIS — Z992 Dependence on renal dialysis: Secondary | ICD-10-CM | POA: Diagnosis not present

## 2024-03-23 DIAGNOSIS — N2581 Secondary hyperparathyroidism of renal origin: Secondary | ICD-10-CM | POA: Diagnosis not present

## 2024-03-24 ENCOUNTER — Ambulatory Visit (HOSPITAL_COMMUNITY): Payer: Self-pay | Admitting: Pharmacist

## 2024-03-24 DIAGNOSIS — Z7901 Long term (current) use of anticoagulants: Secondary | ICD-10-CM | POA: Diagnosis not present

## 2024-03-24 LAB — POCT INR: INR: 2.9 (ref 2.0–3.0)

## 2024-03-25 DIAGNOSIS — N2581 Secondary hyperparathyroidism of renal origin: Secondary | ICD-10-CM | POA: Diagnosis not present

## 2024-03-25 DIAGNOSIS — Z992 Dependence on renal dialysis: Secondary | ICD-10-CM | POA: Diagnosis not present

## 2024-03-25 DIAGNOSIS — N186 End stage renal disease: Secondary | ICD-10-CM | POA: Diagnosis not present

## 2024-03-27 DIAGNOSIS — Z992 Dependence on renal dialysis: Secondary | ICD-10-CM | POA: Diagnosis not present

## 2024-03-27 DIAGNOSIS — N186 End stage renal disease: Secondary | ICD-10-CM | POA: Diagnosis not present

## 2024-03-27 DIAGNOSIS — N2581 Secondary hyperparathyroidism of renal origin: Secondary | ICD-10-CM | POA: Diagnosis not present

## 2024-03-30 DIAGNOSIS — N2581 Secondary hyperparathyroidism of renal origin: Secondary | ICD-10-CM | POA: Diagnosis not present

## 2024-03-30 DIAGNOSIS — N186 End stage renal disease: Secondary | ICD-10-CM | POA: Diagnosis not present

## 2024-03-30 DIAGNOSIS — Z992 Dependence on renal dialysis: Secondary | ICD-10-CM | POA: Diagnosis not present

## 2024-03-31 ENCOUNTER — Ambulatory Visit (HOSPITAL_COMMUNITY): Payer: Self-pay | Admitting: Pharmacist

## 2024-03-31 ENCOUNTER — Encounter (INDEPENDENT_AMBULATORY_CARE_PROVIDER_SITE_OTHER): Admitting: Ophthalmology

## 2024-03-31 DIAGNOSIS — R310 Gross hematuria: Secondary | ICD-10-CM | POA: Diagnosis not present

## 2024-03-31 DIAGNOSIS — N201 Calculus of ureter: Secondary | ICD-10-CM | POA: Diagnosis not present

## 2024-03-31 LAB — POCT INR: INR: 2.7 (ref 2.0–3.0)

## 2024-04-01 DIAGNOSIS — N2581 Secondary hyperparathyroidism of renal origin: Secondary | ICD-10-CM | POA: Diagnosis not present

## 2024-04-01 DIAGNOSIS — Z992 Dependence on renal dialysis: Secondary | ICD-10-CM | POA: Diagnosis not present

## 2024-04-01 DIAGNOSIS — N186 End stage renal disease: Secondary | ICD-10-CM | POA: Diagnosis not present
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
## 2024-04-03 DIAGNOSIS — N2581 Secondary hyperparathyroidism of renal origin: Secondary | ICD-10-CM | POA: Diagnosis not present

## 2024-04-03 DIAGNOSIS — N186 End stage renal disease: Secondary | ICD-10-CM | POA: Diagnosis not present

## 2024-04-03 DIAGNOSIS — Z992 Dependence on renal dialysis: Secondary | ICD-10-CM | POA: Diagnosis not present

## 2024-04-05 DIAGNOSIS — N186 End stage renal disease: Secondary | ICD-10-CM | POA: Diagnosis not present

## 2024-04-05 DIAGNOSIS — Z992 Dependence on renal dialysis: Secondary | ICD-10-CM | POA: Diagnosis not present

## 2024-04-05 DIAGNOSIS — I509 Heart failure, unspecified: Secondary | ICD-10-CM | POA: Diagnosis not present

## 2024-04-06 DIAGNOSIS — Z992 Dependence on renal dialysis: Secondary | ICD-10-CM | POA: Diagnosis not present

## 2024-04-06 DIAGNOSIS — N2581 Secondary hyperparathyroidism of renal origin: Secondary | ICD-10-CM | POA: Diagnosis not present

## 2024-04-06 DIAGNOSIS — N186 End stage renal disease: Secondary | ICD-10-CM | POA: Diagnosis not present

## 2024-04-07 ENCOUNTER — Other Ambulatory Visit: Payer: Self-pay | Admitting: Family Medicine

## 2024-04-07 ENCOUNTER — Ambulatory Visit (INDEPENDENT_AMBULATORY_CARE_PROVIDER_SITE_OTHER): Payer: Medicare HMO

## 2024-04-07 ENCOUNTER — Ambulatory Visit (HOSPITAL_COMMUNITY): Payer: Self-pay | Admitting: Pharmacist

## 2024-04-07 ENCOUNTER — Ambulatory Visit (INDEPENDENT_AMBULATORY_CARE_PROVIDER_SITE_OTHER): Admitting: Ophthalmology

## 2024-04-07 ENCOUNTER — Encounter (INDEPENDENT_AMBULATORY_CARE_PROVIDER_SITE_OTHER): Payer: Self-pay | Admitting: Ophthalmology

## 2024-04-07 DIAGNOSIS — Z961 Presence of intraocular lens: Secondary | ICD-10-CM

## 2024-04-07 DIAGNOSIS — Z794 Long term (current) use of insulin: Secondary | ICD-10-CM

## 2024-04-07 DIAGNOSIS — I10 Essential (primary) hypertension: Secondary | ICD-10-CM

## 2024-04-07 DIAGNOSIS — E113513 Type 2 diabetes mellitus with proliferative diabetic retinopathy with macular edema, bilateral: Secondary | ICD-10-CM

## 2024-04-07 DIAGNOSIS — H25811 Combined forms of age-related cataract, right eye: Secondary | ICD-10-CM

## 2024-04-07 DIAGNOSIS — H35033 Hypertensive retinopathy, bilateral: Secondary | ICD-10-CM | POA: Diagnosis not present

## 2024-04-07 DIAGNOSIS — H4312 Vitreous hemorrhage, left eye: Secondary | ICD-10-CM | POA: Diagnosis not present

## 2024-04-07 DIAGNOSIS — I5023 Acute on chronic systolic (congestive) heart failure: Secondary | ICD-10-CM

## 2024-04-07 LAB — POCT INR: INR: 2 (ref 2.0–3.0)

## 2024-04-07 MED ORDER — BEVACIZUMAB CHEMO INJECTION 1.25MG/0.05ML SYRINGE FOR KALEIDOSCOPE
1.2500 mg | INTRAVITREAL | Status: AC | PRN
  Administered 2024-04-07: 1.25 mg via INTRAVITREAL

## 2024-04-08 DIAGNOSIS — Z992 Dependence on renal dialysis: Secondary | ICD-10-CM | POA: Diagnosis not present

## 2024-04-08 DIAGNOSIS — N186 End stage renal disease: Secondary | ICD-10-CM | POA: Diagnosis not present

## 2024-04-08 DIAGNOSIS — N2581 Secondary hyperparathyroidism of renal origin: Secondary | ICD-10-CM | POA: Diagnosis not present

## 2024-04-09 LAB — CUP PACEART REMOTE DEVICE CHECK
Battery Remaining Longevity: 100 mo
Battery Voltage: 3.01 V
Brady Statistic RV Percent Paced: 0 %
Date Time Interrogation Session: 20250902022825
HighPow Impedance: 38 Ohm
HighPow Impedance: 46 Ohm
Implantable Lead Connection Status: 753985
Implantable Lead Implant Date: 20130603
Implantable Lead Location: 753860
Implantable Lead Model: 6947
Implantable Pulse Generator Implant Date: 20211129
Lead Channel Impedance Value: 608 Ohm
Lead Channel Impedance Value: 874 Ohm
Lead Channel Pacing Threshold Amplitude: 0.625 V
Lead Channel Pacing Threshold Pulse Width: 0.4 ms
Lead Channel Sensing Intrinsic Amplitude: 4.5 mV
Lead Channel Sensing Intrinsic Amplitude: 4.5 mV
Lead Channel Setting Pacing Amplitude: 2 V
Lead Channel Setting Pacing Pulse Width: 0.4 ms
Lead Channel Setting Sensing Sensitivity: 0.3 mV
Zone Setting Status: 755011
Zone Setting Status: 755011

## 2024-04-10 ENCOUNTER — Ambulatory Visit: Payer: Self-pay | Admitting: Cardiology

## 2024-04-10 DIAGNOSIS — Z992 Dependence on renal dialysis: Secondary | ICD-10-CM | POA: Diagnosis not present

## 2024-04-10 DIAGNOSIS — N186 End stage renal disease: Secondary | ICD-10-CM | POA: Diagnosis not present

## 2024-04-10 DIAGNOSIS — N2581 Secondary hyperparathyroidism of renal origin: Secondary | ICD-10-CM | POA: Diagnosis not present

## 2024-04-13 DIAGNOSIS — N186 End stage renal disease: Secondary | ICD-10-CM | POA: Diagnosis not present

## 2024-04-13 DIAGNOSIS — Z992 Dependence on renal dialysis: Secondary | ICD-10-CM | POA: Diagnosis not present

## 2024-04-13 DIAGNOSIS — N2581 Secondary hyperparathyroidism of renal origin: Secondary | ICD-10-CM | POA: Diagnosis not present

## 2024-04-14 ENCOUNTER — Ambulatory Visit (HOSPITAL_COMMUNITY): Payer: Self-pay | Admitting: Pharmacist

## 2024-04-14 LAB — POCT INR: INR: 1.5 — AB (ref 2.0–3.0)

## 2024-04-14 NOTE — Telephone Encounter (Signed)
 Left message about scheduling surgery with Dr Caresse

## 2024-04-14 NOTE — Telephone Encounter (Signed)
 Patient called returning missed call. Patient states his phone may have a spam blocker. Patient is suggesting a different number to call.  CB [phone]

## 2024-04-14 NOTE — Telephone Encounter (Signed)
 Mr. Joss would like to set up his surgery for his right eye with Dr. Caresse. He can be reached at [phone]. He was last seen with Dr. Caresse in July. Please advise.

## 2024-04-15 ENCOUNTER — Other Ambulatory Visit: Payer: Self-pay | Admitting: Family Medicine

## 2024-04-15 ENCOUNTER — Other Ambulatory Visit: Payer: Self-pay

## 2024-04-15 DIAGNOSIS — N186 End stage renal disease: Secondary | ICD-10-CM | POA: Diagnosis not present

## 2024-04-15 DIAGNOSIS — N2581 Secondary hyperparathyroidism of renal origin: Secondary | ICD-10-CM | POA: Diagnosis not present

## 2024-04-15 DIAGNOSIS — E1122 Type 2 diabetes mellitus with diabetic chronic kidney disease: Secondary | ICD-10-CM

## 2024-04-15 DIAGNOSIS — Z992 Dependence on renal dialysis: Secondary | ICD-10-CM | POA: Diagnosis not present

## 2024-04-15 MED ORDER — ONETOUCH DELICA PLUS LANCET30G MISC: 1.0000 | Freq: Three times a day (TID) | 3 refills | Status: AC

## 2024-04-15 NOTE — Progress Notes (Signed)
Remote ICD Transmission.

## 2024-04-16 ENCOUNTER — Ambulatory Visit (HOSPITAL_COMMUNITY): Payer: Self-pay | Admitting: Cardiology

## 2024-04-16 ENCOUNTER — Other Ambulatory Visit: Payer: Self-pay

## 2024-04-16 ENCOUNTER — Ambulatory Visit (HOSPITAL_BASED_OUTPATIENT_CLINIC_OR_DEPARTMENT_OTHER)
Admission: RE | Admit: 2024-04-16 | Discharge: 2024-04-16 | Disposition: A | Discharge status: Home / Self Care | Source: Ambulatory Visit | Attending: Cardiology | Admitting: Cardiology

## 2024-04-16 ENCOUNTER — Ambulatory Visit (HOSPITAL_COMMUNITY)
Admission: RE | Admit: 2024-04-16 | Discharge: 2024-04-16 | Disposition: A | Discharge status: Home / Self Care | Source: Ambulatory Visit | Attending: Cardiology | Admitting: Cardiology

## 2024-04-16 ENCOUNTER — Encounter: Payer: Self-pay | Admitting: Family Medicine

## 2024-04-16 ENCOUNTER — Other Ambulatory Visit (HOSPITAL_COMMUNITY): Payer: Self-pay | Admitting: Cardiology

## 2024-04-16 VITALS — BP 140/0 | HR 87 | Wt 190.6 lb

## 2024-04-16 DIAGNOSIS — Z7901 Long term (current) use of anticoagulants: Secondary | ICD-10-CM | POA: Diagnosis not present

## 2024-04-16 DIAGNOSIS — I2510 Atherosclerotic heart disease of native coronary artery without angina pectoris: Secondary | ICD-10-CM | POA: Insufficient documentation

## 2024-04-16 DIAGNOSIS — N186 End stage renal disease: Secondary | ICD-10-CM | POA: Diagnosis not present

## 2024-04-16 DIAGNOSIS — I255 Ischemic cardiomyopathy: Secondary | ICD-10-CM | POA: Diagnosis not present

## 2024-04-16 DIAGNOSIS — I5023 Acute on chronic systolic (congestive) heart failure: Secondary | ICD-10-CM

## 2024-04-16 DIAGNOSIS — N200 Calculus of kidney: Secondary | ICD-10-CM | POA: Insufficient documentation

## 2024-04-16 DIAGNOSIS — I132 Hypertensive heart and chronic kidney disease with heart failure and with stage 5 chronic kidney disease, or end stage renal disease: Secondary | ICD-10-CM | POA: Insufficient documentation

## 2024-04-16 DIAGNOSIS — E1122 Type 2 diabetes mellitus with diabetic chronic kidney disease: Secondary | ICD-10-CM | POA: Insufficient documentation

## 2024-04-16 DIAGNOSIS — Z95811 Presence of heart assist device: Secondary | ICD-10-CM

## 2024-04-16 DIAGNOSIS — I082 Rheumatic disorders of both aortic and tricuspid valves: Secondary | ICD-10-CM | POA: Insufficient documentation

## 2024-04-16 DIAGNOSIS — Z79899 Other long term (current) drug therapy: Secondary | ICD-10-CM | POA: Diagnosis not present

## 2024-04-16 DIAGNOSIS — Z992 Dependence on renal dialysis: Secondary | ICD-10-CM | POA: Insufficient documentation

## 2024-04-16 DIAGNOSIS — Z4509 Encounter for adjustment and management of other cardiac device: Secondary | ICD-10-CM | POA: Insufficient documentation

## 2024-04-16 DIAGNOSIS — Z95810 Presence of automatic (implantable) cardiac defibrillator: Secondary | ICD-10-CM | POA: Insufficient documentation

## 2024-04-16 DIAGNOSIS — Z951 Presence of aortocoronary bypass graft: Secondary | ICD-10-CM | POA: Diagnosis not present

## 2024-04-16 DIAGNOSIS — Z4801 Encounter for change or removal of surgical wound dressing: Secondary | ICD-10-CM | POA: Diagnosis not present

## 2024-04-16 DIAGNOSIS — I5022 Chronic systolic (congestive) heart failure: Secondary | ICD-10-CM | POA: Diagnosis not present

## 2024-04-16 DIAGNOSIS — I5042 Chronic combined systolic (congestive) and diastolic (congestive) heart failure: Secondary | ICD-10-CM

## 2024-04-16 DIAGNOSIS — D649 Anemia, unspecified: Secondary | ICD-10-CM | POA: Diagnosis not present

## 2024-04-16 LAB — ECHOCARDIOGRAM COMPLETE
Area-P 1/2: 3.91 cm2
S' Lateral: 4.3 cm

## 2024-04-16 MED ORDER — ONETOUCH ULTRA 2 W/DEVICE KIT: 1.0000 | PACK | Freq: Three times a day (TID) | 0 refills | Status: AC

## 2024-04-16 NOTE — Progress Notes (Addendum)
 Patient presents for 2 month follow up with 3.5 year Intermacs in VAD clinic with his brother Marcus Johnson. Reports no problems with VAD equipment or concerns with drive line.  Patient arrived via wheelchair today but was able to step on the scale for his weight. Pt denies dizziness, falls, and heart failure symptoms. Complains of intermittent lightheadedness and fatigue on dialysis days. Still experiencing some intermittent nausea on HD days, but this has improved with Zofran .  Pt reports hematuria has resolved. Recently seen by [facility]. Medical Records requested and reviewed with Dr. Rolan. Urologists requests pt consideration to right ureteroscopy to rule out ureteral tumor.  Pt had cataract removal in left eye at [facility] 12/19/23. Follows with Ophthalmology for continued treatment and discussion right eye cataract removal.   Pt has not had any recent ICD shocks and is taking his Amiodarone  200 BID and Mexiletine 150 mg BID as prescribed. [facility] contacted and to draw Thyroid  Panel and LFT's last visit. Results from thyroid  panel from 02/28/24 displayed below. [facility] to repeat labs tomorrow during treatment and fax to VAD Clinic.  Patient reports he continues M/W/F dialysis. Pt confirms he takes Midodrine  TID on HD days only. He reports taking 10mg  prior to dialysis yesterday and 5mg  at bedtime. Pt hypertensive in VAD Clinic today. Discussed with Dr. Rolan. Pt did not tolerate Hydralazine  on non-HD days. Will continue to monitor. VAD Flow stable no alarms or increase in PI events. Echocardiogram performed prior to appointment. No changes per Dr. Rolan.  Pt on Keflex  500 mg BID (dosed for HD schedule). Marcus Johnson reports they are changing dressing weekly using daily kit. Currently wearing 2 anchors. Reports he wears binder at home, but does not like to wear during dialysis. Complaining of skin irritation around anchor site. Dressing changed today in clinic see full documentation  below. Tegaderm placed underneath anchors to prevent skin irritation from adhesive of anchor.  Dry weight at HD: 82.5 kg. Pt volume up per Dr. Rolan. Pt states they are pulling between 4-4.2L per treatment.Advised by Dr. Rolan to limit fluid intake and salty foods.   Platelet level has been trending down over the last few months. Platelets 109 as of 04/15/24. Pt follows with Hematology and under went bone marrow biopsy 02/11/24 with no concerning etiology.   Vital Signs:  Doppler Pressure: 140 Automatic BP: 120/108 (117) HR: 87 NSR O2: 99% RA   Weight: 190.6 lbs w/ eqt Last wt: 189.4 w/ eqt   VAD Indication: Destination Therapy due to CKD Stage 3b   LVAD assessment:  HM III: Speed: 5700 rpms Flow: 5.2 Power: 4.6 w    PI: 3.2 Alarms: none Events: 5-10  Fixed speed:  5700 Low speed limit:  5400  Primary controller: Back up battery due for replacement in 14 months Secondary controller: Back up battery due for replacement in 26 months  I reviewed the LVAD parameters from today and compared the results to the patient's prior recorded data. LVAD interrogation was NEGATIVE for significant power changes, NEGATIVE for clinical alarms and STABLE for PI events/speed drops. No programming changes were made and pump is functioning within specified parameters. Pt is performing daily controller and system monitor self tests along with completing weekly and monthly maintenance for LVAD equipment.   LVAD equipment check completed and is in good working order. Back-up equipment present and back up battery charged at this appointment.   Annual Equipment Maintenance on UBC/PM was performed 10/29/23.  Patient refused .  Unable to independently complete  Neurocognitive Exam/  [place] Cardiomyopathy, EQ-5D-3L and post-VAD QOL completed by the patient with assistance from the Coordinator/caregiver.   [place] Cardiomyopathy Questionnaire     04/16/2024   11:11 AM 10/29/2023    9:44 AM  04/09/2023   11:07 AM  KCCQ-12  1 a. Ability to shower/bathe Other, Did not do Extremely limited Extremely limited  1 b. Ability to walk 1 block Slightly limited Not at all limited Not at all limited  1 c. Ability to hurry/jog Slightly limited Extremely limited Extremely limited  2. Edema feet/ankles/legs Never over the past 2 weeks Never over the past 2 weeks Never over the past 2 weeks  3. Limited by fatigue Never over the past 2 weeks Never over the past 2 weeks At least once a day  4. Limited by dyspnea Never over the past 2 weeks Never over the past 2 weeks Never over the past 2 weeks  5. Sitting up / on 3+ pillows Never over the past 2 weeks Never over the past 2 weeks Never over the past 2 weeks  6. Limited enjoyment of life Not limited at all Not limited at all Not limited at all  7. Rest of life w/ symptoms Completely satisfied Somewhat satisfied Somewhat satisfied  8 a. Participation in hobbies N/A, did not do for other reasons Slightly limited Severely limited  8 b. Participation in chores N/A, did not do for other reasons N/A, did not do for other reasons Slightly limited  8 c. Visiting family/friends N/A, did not do for other reasons N/A, did not do for other reasons Slightly limited     Exit Site Care: Existing VAD dressing removed and site care performed using sterile technique. Currently being maintained by Marcus Johnson once a week using daily kit. Drive line exit site cleaned with Chlora prep applicators x 2, allowed to dry, and dressing with gauze and tape. Exit site healed and incorporated, the velour is fully implanted at exit site. No redness, tenderness, drainage, foul odor or rash noted. Small scab at exit site. Drive line anchor re-applied over top of small tegaderm to prevent irritation. Pt denies fever or chills. Pt given 8 daily kits, 16 anchors, large gloves and small tegaderms for home use.     Device: Medtronic single Therapies: VF: 240 VT: 200 Slow VT: 143 Pacing: VVI  40 Last check: 01/07/24   BP & Labs: Labs from 04/15/24 Doppler 140 - Doppler is reflecting modified systolic  Hgb 10.5 - No S/S of bleeding. Specifically denies melena/BRBPR or nosebleeds.   LDH 276- established baseline of 160 - 180. Denies tea-colored urine. No power elevations noted on interrogation.   Plan: No medication changes Follow up in 2 months with Dr. Rolan Schuyler Lunger RN, BSN VAD Coordinator 24/7 Pager [phone]    Follow up for Heart Failure/LVAD:  Chief complaint: LVAD followup  Marcus Johnson. is a 63 y.o. male who has a h/o DM2, CAD s/p CABG 2002, systolic HF due to ischemic cardiomyopathy with EF 20-25% (echo 12/15), DM2 and CKD. He is s/p Medtronic single chamber ICD.   Admitted in 12/15 due to ADHF. Required short course milrinone  for diuresis. Diuresed 30 pounds.    CPX 2/16 showed severely reduced functional capacity.  There was a significant disconnect between his symptoms and the CPX.  RHC in 6/16 showed fairly normal filling pressures and low but not markedly low cardiac output.    CPX (4/19) was submaximal but suggested severe limitation from  HF.    He was admitted in 6/19 with marked volume overload and dyspnea.  Echo in 6/19 showed EF 20% with severe global hypokinesis.  LHC/RHC showed severe native vessel CAD with patent LIMA-D, SVG-OM, and SVG-PLV; cardiac index low.  He was started on milrinone  and diuresed.  We discussed LVAD extensively in light of low output, creatinine that is trending up and concern for decompensation of RV over time. He was adamant that he did not want to pursue LVAD workup yet and did not want a referral for transplant evaluation. Milrinone  was weaned off and he was discharged home.    Admitted 08/01/18-08/07/18 with volume overload and left foot wound. Diuresed 38 lbs with IV lasix  and then transitioned back to torsemide  100 mg BID. ID was consulted with concerns for left foot osteomyelitis noted on CT scan. He got  IV ancef  q8 hours x 42 days. [facility] provided teaching and supplies for home antibiotics. Course complicated by AKI. DC weight 200 lbs.    He went into atrial flutter and had DCCV to NSR in 1/20.    Echo in 4/21 showed EF 25% with apical akinesis and diffuse hypokinesis relatively preserved in lateral wall, mildly decreased RV systolic function, PASP 41 mmHg. CPX in 5/21 showed severe functional limitation suggestive of advanced HF. 5/21 Zio patch showed short atrial flutter runs, 3.8% PVCs.    He developed COVID-19 infection in 1/22.  Symptoms were predominantly GI.    He developed progressive worsening of CHF in early 2022 and was admitted in 3/22 with low output HF.  Impella 5.5 was placed as bridge to Heartmate III LVAD placed in 10/13/20.  He developed post-op renal failure and ended up on dialysis.  He was markedly deconditioned post-op and went to CIR.    Still has pain and minimal function in his right hand.  Nerve conduction study showed a chronic sensorimotor neuropathy in the right arm.    Ramp echo in 3/23 showed severe LV dysfunction with mild RV enlargement and mild dysfunction, mild-moderate AI.  Speed was increased to 5800 rpm.  The aortic valve still opened with every beat but only slightly. Flow increased from 4.1 to 5.3 L/min and PI remained stable.   Patient was admitted in 4/23 with falls and lightheadedness. He fractured his left foot when falling and initially was unable to walk.  He was thought to be hypotensive due to over-diuresis and dry weight was increased.  Midodrine  on HD days was also increased.  He was treated for a driveline infection with vancomycin  then doxycycline , now off.   ICD shock for VT in 7/23, not started on amiodarone  given only 1 episode.   Echo in 3/24 showed EF 20-25% with midline IV septum, mild RV dysfunction with severe RVE, moderate AI.   In 5/24, patient had VT treated with ATP.      Patient was shocked by his ICD for VT on 04/17/23, 04/20/23, and  04/21/23.  I had him increase his amiodarone  to 200 mg bid x 2 wks then back to 200 mg daily.  After discussion with Dr. Fernande, I started him on mexiletine as well.    Ramp echo was in 9/24.  Starting speed 5800 rpm with moderate AI, slight aortic valve opening each beat, moderate RV enlargement/moderate RV dysfunction, septum shifted slightly to the left.  Speed decreased to 5700 rpm with moderate AI, aortic valve opening each beat, midline septum with flow fairly stable at 5 L/min.  I left speed at  5700 rpm.   With chronic thrombocytopenia, patient had hematology workup including bone marrow biopsy. The biopsy was unremarkable and platelets have trended back up.    He is also seeing urology for workup of hematuria.  Nephrolithiasis has been found.   Echo was done today and reviewed, EF 20% with moderate RV enlargement/mild RV dysfunction, IV septum near midline, Aortic valve probably opens with each beat, moderate aortic insufficiency, moderate TR, IVC dilated.   Patient returns for followup of LVAD.  He is using midodrine  only on HD days. No further hematuria.  Breathing is stable, no dyspnea with his usual activities.  Able to walk up stairs.  No lightheadedness.  No low flows, has 5-10 PI events/day.     Labs (3/24): LDH 222, hgb 11.2 Labs (6/24): LDH 219, hgb 11.3, TSH normal Labs (9/24): TSH normal, hgb 12, LFTs normal, LDH 215 Labs (1/25): LFTs normal, TSH normal Labs (3/25): LFTs normal, TSH normal, LDH 200, hgb 11.3, plts 115 Labs (6/25): LDH 194, LFTs normal Labs (7/25): hgb 11.2, plts 145 => 132, LDH 214  Past Medical History:  Diagnosis Date   AICD (automatic cardioverter/defibrillator) present    Single-chamber  implantable cardiac defibrillator - Medtronic   Atrial fibrillation (HCC)    Cataract    Mixed form OU   CHF (congestive heart failure) (HCC)    Chronic kidney disease    Chronic kidney disease (CKD), stage III (moderate) (HCC)    Chronic systolic heart failure  (HCC)    a. Echo 5/13: Mild LVE, mild LVH, EF 10%, anteroseptal, lateral, apical AK, mild MR, mild LAE, moderately reduced RVSF, mild RAE, PASP 60;  b. 07/2014 Echo: EF 20-2%, diff HK, AKI of antsep/apical/mid-apicalinferior, mod reduced RV.   Coronary artery disease    a. s/p CABG 2002 b. LHC 5/13:  dLM 80%, LAD subtotally occluded, pCFX occluded, pRCA 50%, mid? Occlusion with high take off of the PDA with 70% multiple lesions-not bypassed and supplies collaterals to LAD, LIMA-IM/ramus ok, S-OM ok, S-PLA branches ok. Medical therapy was recommended   COVID    Diabetic retinopathy (HCC)    NPDR OD, PDR OS   Dyspnea    Gout    on daily RX (01/08/2018)   Hypertension    Hypertensive retinopathy    OU   Ischemic cardiomyopathy    a. Echo 5/13: Mild LVE, mild LVH, EF 10%, anteroseptal, lateral, apical AK, mild MR, mild LAE, moderately reduced RVSF, mild RAE, PASP 60;  b. 01/2012 s/p MDT [id number] Protecta XT VR AICD;  c. 07/2014 Echo: EF 20-2%, diff HK, AKI of antsep/apical/mid-apicalinferior, mod reduced RV.   MRSA (methicillin resistant Staphylococcus aureus)    Status post right foot plantar deep infection with MRSA status post  I&D 10/2008   Myocardial infarction [facility])    was told I'd had several before heart OR in 2002 (01/08/2018)   Noncompliance    Peripheral neuropathy    Retinopathy, diabetic, background (HCC)    Syncope    Type II diabetes mellitus (HCC)    requiring insulin     Vitreous hemorrhage, left (HCC)    and proliferative diabetic retinopathy    Current Outpatient Medications  Medication Sig Dispense Refill   acetaminophen  (TYLENOL ) 325 MG tablet Take by mouth.     allopurinol  (ZYLOPRIM ) 300 MG tablet Take by mouth.     amiodarone  (PACERONE ) 200 MG tablet Take 1 tablet (200 mg total) by mouth 2 (two) times daily. Take 1 tablet (200 mg) by mouth twice  daily for 2 weeks starting 04/23/23, then decrease to 1 tablet (200 mg) daily 05/07/23. 90 tablet 3   BD PEN NEEDLE NANO 2ND  GEN 32G X 4 MM MISC USE TO INJECT INSULIN  DAILY 100 each 0   cephALEXin  (KEFLEX ) 500 MG capsule Take 500 mg by mouth 2 (two) times daily.     finasteride (PROSCAR) 5 MG tablet Take 5 mg by mouth daily.     gabapentin  (NEURONTIN ) 100 MG capsule TAKE 1 CAPSULE ( 100 MG) IN THE MORNING AND 2 CAPSULES ( 200 MG) AT BEDTIME 90 capsule 3   glucose blood (ACCU-CHEK GUIDE) test strip USE AS INSTRUCTED 3 TIMES DAILY 100 strip 1   insulin  glargine (LANTUS  SOLOSTAR) 100 UNIT/ML Solostar Pen INJECT 60 UNITS INTO THE SKIN AT BEDTIME. 15 mL 23   Lancets (ONETOUCH DELICA PLUS LANCET30G) MISC 1 each by Does not apply route 4 (four) times daily -  before meals and at bedtime. 100 each 3   mexiletine (MEXITIL) 150 MG capsule TAKE 1 CAPSULE BY MOUTH TWICE A DAY 180 capsule 3   midodrine  (PROAMATINE ) 10 MG tablet TAKE 2 TABLETS 3 TIMES DAILY ON DIALYSIS DAYS NO MIDODRINE  ON NON DIALYSIS DAYS 240 tablet 7   multivitamin (RENA-VIT) TABS tablet TAKE 1 TABLET BY MOUTH EVERYDAY AT BEDTIME 90 tablet 3   ondansetron  (ZOFRAN ) 4 MG tablet Take 1 tablet (4 mg total) by mouth every 8 (eight) hours as needed for nausea or vomiting. 30 tablet 3   pantoprazole  (PROTONIX ) 40 MG tablet TAKE 1 TABLET BY MOUTH EVERY DAY 90 tablet 3   polyethylene glycol powder (GLYCOLAX /MIRALAX ) 17 GM/SCOOP powder      rosuvastatin  (CRESTOR ) 10 MG tablet Take 1 tablet (10 mg total) by mouth daily. 90 tablet 3   sevelamer (RENAGEL) 800 MG tablet Take 800 mg by mouth 3 (three) times daily.     traMADol  (ULTRAM ) 50 MG tablet TAKE 1 TABLET BY MOUTH EVERY 12 HOURS AS NEEDED 30 tablet 3   warfarin (COUMADIN ) 3 MG tablet TAKE 4.5 MG (1.5 TABS) DAILY OR AS DIRECTED BY HF CLINIC 150 tablet 3   Blood Glucose Monitoring Suppl (ONE TOUCH ULTRA 2) w/Device KIT 1 Device by Does not apply route 3 (three) times daily. 1 kit 0   ketorolac (ACULAR) 0.5 % ophthalmic solution Place 1 drop into the left eye 4 (four) times daily. (Patient not taking: Reported on 03/02/2024)      moxifloxacin (VIGAMOX) 0.5 % ophthalmic solution Apply 1 drop to eye 3 (three) times daily. (Patient not taking: Reported on 03/02/2024)     naphazoline-glycerin  (CLEAR EYES REDNESS) 0.012-0.25 % SOLN Place 1-2 drops into the right eye 4 (four) times daily as needed for eye irritation. (Patient not taking: Reported on 03/02/2024)     prednisoLONE  acetate (PRED FORTE ) 1 % ophthalmic suspension 1 drop 4 (four) times daily. (Patient not taking: Reported on 03/02/2024)     No current facility-administered medications for this encounter.    Meclizine  hcl and Ivabradine   REVIEW OF SYSTEMS: All systems negative except as listed in HPI, PMH and Problem list.  LVAD INTERROGATION:  Please see LVAD nurse's note above, I reviewed parameters (stable).  No low flow events, 5-10 PIs/day.   Physical Exam: MAP 100s General: Well appearing this am. NAD.  HEENT: Normal. Neck: Supple, JVP 10-12 cm. Carotids OK.  Cardiac:  Mechanical heart sounds with LVAD hum present.  Lungs:  CTAB, normal effort.  Abdomen:  NT, ND, no HSM. No bruits or  masses. +BS  LVAD exit site: Well-healed and incorporated. Dressing dry and intact. No erythema or drainage. Stabilization device present and accurately applied. Driveline dressing changed daily per sterile technique. Extremities:  Warm and dry. No cyanosis, clubbing, rash, or edema.  Neuro:  Alert & oriented x 3. Cranial nerves grossly intact. Moves all 4 extremities w/o difficulty. Affect pleasant    ASSESSMENT AND PLAN:  1. Chronic systolic CHF:  Ischemic cardiomyopathy. Has Medtronic ICD. Low output HF, admitted and Impella 5.5 placed 10/04/20.  HM3 LVAD was placed on 10/13/20. Developed post-op renal failure requiring eventual hemodialysis.  Ramp echo in 3/23, speed increased to 5800 rpm under echo guidance with increase in flow and stable PI.  Echo in 3/24 showed EF 20-25% with midline IV septum, mild RV dysfunction with severe RVE, moderate AI. Ramp echo in 9/24 with  decrease in speed in setting of VT episodes.  Echo today showed EF 20% with moderate RV enlargement/mild RV dysfunction, IV septum near midline, Aortic valve probably opens with each beat, moderate aortic insufficiency, moderate TR, IVC dilated. MAP elevated on non-HD days, takes midodrine  on HD days. No further VT.  NYHA class II, he is volume overloaded on exam.   LVAD interrogation shows rare PI events, no low flows/suction.  - Continue midodrine  on HD days and reminded him not to take midodrine  on the non-HD days.    - He did not tolerate hydralazine  on non-HD days for elevated MAP.  - He needs to have his dry weight lowered, volume overloaded on exam with dilated IVC on echo. .  2. LVAD:  VAD interrogated personally.  Parameters stable with a few PI events on HD days. No low flow events or suction.  - He is now off ASA.  - Continue warfarin with INR goal 2-2.5.   3. CAD s/p CABG 2002:  Last cath in 6/19 with patent grafts. No chest pain.  - Continue Crestor   4. ESRD: Currently dialyzing via tunneled catheter. No low flow events.  - Continue HD M-W-F  - With continuous flow LVAD, think AV fistula would be unlikely to mature.   5. Atrial flutter/fibrillation: s/p DC-CV 08/21/18.  Has occasional episodes of AF but not prolonged.  - Continue amiodarone , check LFTs and TSH.  He will need regular eye exam.  6. 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 This has been improving though not completely normal.   7. DM: Per primary care.  8. Anemia: no BRBPR/melena.  - CBC today.  9. Suspected Gastroparesis: He is no longer taking Reglan .  10. VT: Episode in 7/23 with successful ICD discharge.  No definite trigger, he did not actually feel the VT. VT again in 5/24 treated with ATP, amiodarone  started.  Episodes of VT 9/11, 9/14, and 04/21/23.  Ramp echo in 9/24 with decreased speed.  No further VT.  - Continue amiodarone  200 mg daily.   Check LFTs/TSH as above.  - Continue mexiletine 150 mg bid.  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 Driveline site looks good today.  12. Thrombocytopenia: Chronic.  Platelets trending up recently.  Bone marrow biopsy unremarkable.   - Sees hematology.  13. Hematuria: ?from nephrolithiasis.  Ongoing urology workup.   Followup 2 months.   I spent 42 minutes reviewing records, interviewing/examining patient, and managing orders.    Ezra Shuck 04/16/2024

## 2024-04-16 NOTE — Progress Notes (Signed)
  Echocardiogram 2D Echocardiogram has been performed.  Koleen KANDICE Popper, RDCS 04/16/2024, 8:59 AM

## 2024-04-16 NOTE — Patient Instructions (Signed)
 No medication changes Additional labs to be obtained at HD tomorrow Will call [facility] for updated records Return in 2 months; Will call for scheduling

## 2024-04-17 ENCOUNTER — Other Ambulatory Visit (HOSPITAL_COMMUNITY): Payer: Self-pay

## 2024-04-17 DIAGNOSIS — N2581 Secondary hyperparathyroidism of renal origin: Secondary | ICD-10-CM | POA: Diagnosis not present

## 2024-04-17 DIAGNOSIS — N186 End stage renal disease: Secondary | ICD-10-CM | POA: Diagnosis not present

## 2024-04-17 DIAGNOSIS — Z992 Dependence on renal dialysis: Secondary | ICD-10-CM | POA: Diagnosis not present

## 2024-04-20 DIAGNOSIS — N2581 Secondary hyperparathyroidism of renal origin: Secondary | ICD-10-CM | POA: Diagnosis not present

## 2024-04-20 DIAGNOSIS — N186 End stage renal disease: Secondary | ICD-10-CM | POA: Diagnosis not present

## 2024-04-20 DIAGNOSIS — Z992 Dependence on renal dialysis: Secondary | ICD-10-CM | POA: Diagnosis not present

## 2024-04-21 ENCOUNTER — Ambulatory Visit (HOSPITAL_COMMUNITY): Payer: Self-pay | Admitting: Pharmacist

## 2024-04-21 DIAGNOSIS — Z7901 Long term (current) use of anticoagulants: Secondary | ICD-10-CM | POA: Diagnosis not present

## 2024-04-21 LAB — POCT INR: INR: 1.7 — AB (ref 2.0–3.0)

## 2024-04-22 DIAGNOSIS — N2581 Secondary hyperparathyroidism of renal origin: Secondary | ICD-10-CM | POA: Diagnosis not present

## 2024-04-22 DIAGNOSIS — N186 End stage renal disease: Secondary | ICD-10-CM | POA: Diagnosis not present

## 2024-04-22 DIAGNOSIS — Z992 Dependence on renal dialysis: Secondary | ICD-10-CM | POA: Diagnosis not present

## 2024-04-24 DIAGNOSIS — Z992 Dependence on renal dialysis: Secondary | ICD-10-CM | POA: Diagnosis not present

## 2024-04-24 DIAGNOSIS — N186 End stage renal disease: Secondary | ICD-10-CM | POA: Diagnosis not present

## 2024-04-24 DIAGNOSIS — N2581 Secondary hyperparathyroidism of renal origin: Secondary | ICD-10-CM | POA: Diagnosis not present

## 2024-04-27 DIAGNOSIS — N2581 Secondary hyperparathyroidism of renal origin: Secondary | ICD-10-CM | POA: Diagnosis not present

## 2024-04-27 DIAGNOSIS — Z992 Dependence on renal dialysis: Secondary | ICD-10-CM | POA: Diagnosis not present

## 2024-04-27 DIAGNOSIS — N186 End stage renal disease: Secondary | ICD-10-CM | POA: Diagnosis not present

## 2024-04-28 ENCOUNTER — Ambulatory Visit (HOSPITAL_COMMUNITY): Payer: Self-pay | Admitting: Pharmacist

## 2024-04-28 LAB — POCT INR: INR: 2 (ref 2.0–3.0)
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
## 2024-04-29 DIAGNOSIS — N186 End stage renal disease: Secondary | ICD-10-CM | POA: Diagnosis not present

## 2024-04-29 DIAGNOSIS — Z992 Dependence on renal dialysis: Secondary | ICD-10-CM | POA: Diagnosis not present

## 2024-04-29 DIAGNOSIS — N2581 Secondary hyperparathyroidism of renal origin: Secondary | ICD-10-CM | POA: Diagnosis not present

## 2024-05-01 DIAGNOSIS — N2581 Secondary hyperparathyroidism of renal origin: Secondary | ICD-10-CM | POA: Diagnosis not present

## 2024-05-01 DIAGNOSIS — N186 End stage renal disease: Secondary | ICD-10-CM | POA: Diagnosis not present

## 2024-05-01 DIAGNOSIS — Z992 Dependence on renal dialysis: Secondary | ICD-10-CM | POA: Diagnosis not present

## 2024-05-04 DIAGNOSIS — N2581 Secondary hyperparathyroidism of renal origin: Secondary | ICD-10-CM | POA: Diagnosis not present

## 2024-05-04 DIAGNOSIS — N186 End stage renal disease: Secondary | ICD-10-CM | POA: Diagnosis not present

## 2024-05-04 DIAGNOSIS — Z992 Dependence on renal dialysis: Secondary | ICD-10-CM | POA: Diagnosis not present

## 2024-05-05 ENCOUNTER — Ambulatory Visit (HOSPITAL_COMMUNITY): Payer: Self-pay | Admitting: Pharmacist

## 2024-05-05 DIAGNOSIS — N186 End stage renal disease: Secondary | ICD-10-CM | POA: Diagnosis not present

## 2024-05-05 DIAGNOSIS — I509 Heart failure, unspecified: Secondary | ICD-10-CM | POA: Diagnosis not present

## 2024-05-05 DIAGNOSIS — Z992 Dependence on renal dialysis: Secondary | ICD-10-CM | POA: Diagnosis not present

## 2024-05-05 LAB — POCT INR: INR: 2.1 (ref 2.0–3.0)

## 2024-05-06 DIAGNOSIS — N186 End stage renal disease: Secondary | ICD-10-CM | POA: Diagnosis not present

## 2024-05-06 DIAGNOSIS — Z992 Dependence on renal dialysis: Secondary | ICD-10-CM | POA: Diagnosis not present

## 2024-05-06 DIAGNOSIS — N2581 Secondary hyperparathyroidism of renal origin: Secondary | ICD-10-CM | POA: Diagnosis not present

## 2024-05-07 ENCOUNTER — Encounter: Payer: Self-pay | Admitting: Podiatry

## 2024-05-07 ENCOUNTER — Ambulatory Visit: Admitting: Podiatry

## 2024-05-07 DIAGNOSIS — E1122 Type 2 diabetes mellitus with diabetic chronic kidney disease: Secondary | ICD-10-CM

## 2024-05-07 DIAGNOSIS — M79674 Pain in right toe(s): Secondary | ICD-10-CM

## 2024-05-07 DIAGNOSIS — N186 End stage renal disease: Secondary | ICD-10-CM | POA: Diagnosis not present

## 2024-05-07 DIAGNOSIS — B351 Tinea unguium: Secondary | ICD-10-CM | POA: Diagnosis not present

## 2024-05-07 DIAGNOSIS — M79675 Pain in left toe(s): Secondary | ICD-10-CM | POA: Diagnosis not present

## 2024-05-07 DIAGNOSIS — Z992 Dependence on renal dialysis: Secondary | ICD-10-CM

## 2024-05-07 DIAGNOSIS — Z794 Long term (current) use of insulin: Secondary | ICD-10-CM

## 2024-05-07 DIAGNOSIS — Q828 Other specified congenital malformations of skin: Secondary | ICD-10-CM | POA: Diagnosis not present

## 2024-05-07 NOTE — Progress Notes (Signed)
 This patient presents  to my office for at risk foot care.  This patient requires this care by a professional since this patient will be at risk due to having  CKD, coagulation defect and DM.  This patient is unable to cut nails himself since the patient cannot reach his nails.These nails are painful walking and wearing shoes. Marcus Johnson  He presents to the office with his brother.  This patient presents for at risk foot care today.  General Appearance  Alert, conversant and in no acute stress.  Vascular  Dorsalis pedis and posterior tibial  pulses are absent  bilaterally.  Capillary return is within normal limits  bilaterally. Temperature is within normal limits  bilaterally.  Neurologic  Senn-Weinstein monofilament wire test absent  bilaterally. Muscle power within normal limits bilaterally.  Nails Thick disfigured discolored nails with subungual debris  from hallux to fifth toes bilaterally. No evidence of bacterial infection or drainage bilaterally.  Orthopedic  No limitations of motion  feet .  No crepitus or effusions noted.  No bony pathology or digital deformities noted.  No  STJ ROM left foot.  Skin  normotropic skin with no porokeratosis noted bilaterally.  No signs of infections or ulcers noted.   Callus lateral aspect fifth metabase left foot symptomatic.  Callus plantar aspect right foot.  Onychomycosis  Pain in right toes  Pain in left toes  Debride callus with dremel tool.  Consent was obtained for treatment procedures.   Mechanical debridement of nails 1-5  bilaterally performed with a nail nipper.  Filed with dremel without incident.   Marcus Johnson Cordella Bold DPM

## 2024-05-08 DIAGNOSIS — N186 End stage renal disease: Secondary | ICD-10-CM | POA: Diagnosis not present

## 2024-05-11 DIAGNOSIS — Z992 Dependence on renal dialysis: Secondary | ICD-10-CM | POA: Diagnosis not present

## 2024-05-12 ENCOUNTER — Encounter (INDEPENDENT_AMBULATORY_CARE_PROVIDER_SITE_OTHER): Payer: Self-pay | Admitting: Ophthalmology

## 2024-05-12 ENCOUNTER — Other Ambulatory Visit: Payer: Self-pay | Admitting: Family

## 2024-05-12 ENCOUNTER — Ambulatory Visit (INDEPENDENT_AMBULATORY_CARE_PROVIDER_SITE_OTHER): Admitting: Ophthalmology

## 2024-05-12 ENCOUNTER — Ambulatory Visit (HOSPITAL_COMMUNITY): Payer: Self-pay | Admitting: Pharmacist

## 2024-05-12 DIAGNOSIS — Z961 Presence of intraocular lens: Secondary | ICD-10-CM | POA: Diagnosis not present

## 2024-05-12 DIAGNOSIS — E113513 Type 2 diabetes mellitus with proliferative diabetic retinopathy with macular edema, bilateral: Secondary | ICD-10-CM | POA: Diagnosis not present

## 2024-05-12 DIAGNOSIS — H35033 Hypertensive retinopathy, bilateral: Secondary | ICD-10-CM | POA: Diagnosis not present

## 2024-05-12 DIAGNOSIS — H4312 Vitreous hemorrhage, left eye: Secondary | ICD-10-CM | POA: Diagnosis not present

## 2024-05-12 DIAGNOSIS — Z794 Long term (current) use of insulin: Secondary | ICD-10-CM | POA: Diagnosis not present

## 2024-05-12 DIAGNOSIS — I10 Essential (primary) hypertension: Secondary | ICD-10-CM | POA: Diagnosis not present

## 2024-05-12 DIAGNOSIS — H25811 Combined forms of age-related cataract, right eye: Secondary | ICD-10-CM | POA: Diagnosis not present

## 2024-05-12 DIAGNOSIS — Z95811 Presence of heart assist device: Secondary | ICD-10-CM

## 2024-05-12 LAB — POCT INR: INR: 2 (ref 2.0–3.0)

## 2024-05-12 MED ORDER — BEVACIZUMAB CHEMO INJECTION 1.25MG/0.05ML SYRINGE FOR KALEIDOSCOPE
1.2500 mg | INTRAVITREAL | Status: AC | PRN
  Administered 2024-05-12: 1.25 mg via INTRAVITREAL

## 2024-05-13 ENCOUNTER — Other Ambulatory Visit: Payer: Self-pay | Admitting: Family Medicine

## 2024-05-13 DIAGNOSIS — Z961 Presence of intraocular lens: Secondary | ICD-10-CM | POA: Diagnosis not present

## 2024-05-13 DIAGNOSIS — H547 Unspecified visual loss: Secondary | ICD-10-CM | POA: Diagnosis not present

## 2024-05-13 DIAGNOSIS — H50112 Monocular exotropia, left eye: Secondary | ICD-10-CM | POA: Diagnosis not present

## 2024-05-13 DIAGNOSIS — Z9842 Cataract extraction status, left eye: Secondary | ICD-10-CM | POA: Diagnosis not present

## 2024-05-13 DIAGNOSIS — H35033 Hypertensive retinopathy, bilateral: Secondary | ICD-10-CM | POA: Diagnosis not present

## 2024-05-13 DIAGNOSIS — H25811 Combined forms of age-related cataract, right eye: Secondary | ICD-10-CM | POA: Diagnosis not present

## 2024-05-13 DIAGNOSIS — N186 End stage renal disease: Secondary | ICD-10-CM | POA: Diagnosis not present

## 2024-05-13 DIAGNOSIS — H4312 Vitreous hemorrhage, left eye: Secondary | ICD-10-CM | POA: Diagnosis not present

## 2024-05-13 DIAGNOSIS — E113513 Type 2 diabetes mellitus with proliferative diabetic retinopathy with macular edema, bilateral: Secondary | ICD-10-CM | POA: Diagnosis not present

## 2024-05-13 NOTE — Telephone Encounter (Signed)
 Patient overdue for appointment, please advise if okay to provide 30 days while we get him scheduled

## 2024-05-14 ENCOUNTER — Other Ambulatory Visit (HOSPITAL_COMMUNITY): Payer: Self-pay | Admitting: Cardiology

## 2024-05-14 DIAGNOSIS — G629 Polyneuropathy, unspecified: Secondary | ICD-10-CM

## 2024-05-18 DIAGNOSIS — N2581 Secondary hyperparathyroidism of renal origin: Secondary | ICD-10-CM | POA: Diagnosis not present

## 2024-05-18 DIAGNOSIS — Z992 Dependence on renal dialysis: Secondary | ICD-10-CM | POA: Diagnosis not present

## 2024-05-19 ENCOUNTER — Ambulatory Visit (HOSPITAL_COMMUNITY): Payer: Self-pay | Admitting: Pharmacist

## 2024-05-19 DIAGNOSIS — Z7901 Long term (current) use of anticoagulants: Secondary | ICD-10-CM | POA: Diagnosis not present

## 2024-05-19 LAB — POCT INR: INR: 2 (ref 2.0–3.0)

## 2024-05-20 DIAGNOSIS — N186 End stage renal disease: Secondary | ICD-10-CM | POA: Diagnosis not present

## 2024-05-20 DIAGNOSIS — Z992 Dependence on renal dialysis: Secondary | ICD-10-CM | POA: Diagnosis not present

## 2024-05-22 DIAGNOSIS — N186 End stage renal disease: Secondary | ICD-10-CM | POA: Diagnosis not present

## 2024-05-26 ENCOUNTER — Ambulatory Visit (HOSPITAL_COMMUNITY): Payer: Self-pay | Admitting: Pharmacist

## 2024-05-26 ENCOUNTER — Encounter: Payer: Self-pay | Admitting: Family Medicine

## 2024-05-26 ENCOUNTER — Ambulatory Visit (INDEPENDENT_AMBULATORY_CARE_PROVIDER_SITE_OTHER): Admitting: Family Medicine

## 2024-05-26 ENCOUNTER — Telehealth: Payer: Self-pay

## 2024-05-26 VITALS — BP 110/68 | Temp 98.0°F | Ht 70.0 in | Wt 187.0 lb

## 2024-05-26 DIAGNOSIS — I2510 Atherosclerotic heart disease of native coronary artery without angina pectoris: Secondary | ICD-10-CM | POA: Diagnosis not present

## 2024-05-26 DIAGNOSIS — N179 Acute kidney failure, unspecified: Secondary | ICD-10-CM | POA: Diagnosis not present

## 2024-05-26 DIAGNOSIS — N185 Chronic kidney disease, stage 5: Secondary | ICD-10-CM

## 2024-05-26 DIAGNOSIS — E785 Hyperlipidemia, unspecified: Secondary | ICD-10-CM

## 2024-05-26 DIAGNOSIS — I5022 Chronic systolic (congestive) heart failure: Secondary | ICD-10-CM | POA: Diagnosis not present

## 2024-05-26 DIAGNOSIS — Z95811 Presence of heart assist device: Secondary | ICD-10-CM

## 2024-05-26 DIAGNOSIS — D649 Anemia, unspecified: Secondary | ICD-10-CM

## 2024-05-26 DIAGNOSIS — E1129 Type 2 diabetes mellitus with other diabetic kidney complication: Secondary | ICD-10-CM

## 2024-05-26 DIAGNOSIS — I10 Essential (primary) hypertension: Secondary | ICD-10-CM

## 2024-05-26 DIAGNOSIS — Z1211 Encounter for screening for malignant neoplasm of colon: Secondary | ICD-10-CM

## 2024-05-26 LAB — LIPID PANEL
Cholesterol: 132 mg/dL (ref 0–200)
HDL: 28.2 mg/dL — ABNORMAL LOW (ref >39.00)
LDL Cholesterol: 69 mg/dL (ref 0–99)
NonHDL: 103.83
Total CHOL/HDL Ratio: 5
Triglycerides: 174 mg/dL — ABNORMAL HIGH (ref 0.0–149.0)
VLDL: 34.8 mg/dL (ref 0.0–40.0)

## 2024-05-26 LAB — TSH: TSH: 2.35 u[IU]/mL (ref 0.35–5.50)

## 2024-05-26 LAB — CBC WITH DIFFERENTIAL/PLATELET
Basophils Absolute: 0.1 K/uL (ref 0.0–0.1)
Basophils Relative: 0.9 % (ref 0.0–3.0)
Eosinophils Absolute: 0.1 K/uL (ref 0.0–0.7)
Eosinophils Relative: 2.3 % (ref 0.0–5.0)
HCT: 37 % — ABNORMAL LOW (ref 39.0–52.0)
Hemoglobin: 11.9 g/dL — ABNORMAL LOW (ref 13.0–17.0)
Lymphocytes Relative: 24.8 % (ref 12.0–46.0)
Lymphs Abs: 1.6 K/uL (ref 0.7–4.0)
MCHC: 32.1 g/dL (ref 30.0–36.0)
MCV: 93.7 fl (ref 78.0–100.0)
Monocytes Absolute: 0.7 K/uL (ref 0.1–1.0)
Monocytes Relative: 11.5 % (ref 3.0–12.0)
Neutro Abs: 3.9 K/uL (ref 1.4–7.7)
Neutrophils Relative %: 60.5 % (ref 43.0–77.0)
Platelets: 156 K/uL (ref 150.0–400.0)
RBC: 3.95 Mil/uL — ABNORMAL LOW (ref 4.22–5.81)
RDW: 17.9 % — ABNORMAL HIGH (ref 11.5–15.5)
WBC: 6.5 K/uL (ref 4.0–10.5)

## 2024-05-26 LAB — HEPATIC FUNCTION PANEL
ALT: 22 U/L (ref 0–53)
AST: 33 U/L (ref 0–37)
Albumin: 4.7 g/dL (ref 3.5–5.2)
Alkaline Phosphatase: 104 U/L (ref 39–117)
Bilirubin, Direct: 0.1 mg/dL (ref 0.0–0.3)
Total Bilirubin: 0.6 mg/dL (ref 0.2–1.2)
Total Protein: 8.4 g/dL — ABNORMAL HIGH (ref 6.0–8.3)

## 2024-05-26 LAB — BASIC METABOLIC PANEL WITH GFR
BUN: 33 mg/dL — ABNORMAL HIGH (ref 6–23)
CO2: 26 meq/L (ref 19–32)
Calcium: 9 mg/dL (ref 8.4–10.5)
Chloride: 96 meq/L (ref 96–112)
Creatinine, Ser: 6.72 mg/dL (ref 0.40–1.50)
GFR: 8.14 mL/min — CL (ref >60.00)
Glucose, Bld: 103 mg/dL — ABNORMAL HIGH (ref 70–99)
Potassium: 4.6 meq/L (ref 3.5–5.1)
Sodium: 139 meq/L (ref 135–145)

## 2024-05-26 LAB — HEMOGLOBIN A1C: Hgb A1c MFr Bld: 6.6 % — ABNORMAL HIGH (ref 4.6–6.5)

## 2024-05-26 LAB — POCT INR: INR: 2 (ref 2.0–3.0)

## 2024-05-26 NOTE — Progress Notes (Signed)
 Subjective:    Patient ID: Marcus Johnson., male    DOB: 11-29-1960, 63 y.o.   MRN: [id number]  HPI Here to follow up on issues. He has no complaints today. He sees Cardiology regularly for CAD and CHF. He has had the LVAD in place for a little over 3 years now. His BP is stable. He sees Dr. Zamora every 5 weeks for his diabetic retinopathy. He sees Urology for prostate checks and to follow some kidney stones. He sees Nephrology for his ESRD, and he has dialysis 3 days a week. He says his appetite is good.    Review of Systems  Constitutional: Negative.   HENT: Negative.    Eyes: Negative.   Respiratory: Negative.    Cardiovascular: Negative.   Gastrointestinal: Negative.   Genitourinary: Negative.   Musculoskeletal: Negative.   Skin: Negative.   Neurological: Negative.   Psychiatric/Behavioral: Negative.         Objective:   Physical Exam Constitutional:      General: He is not in acute distress.    Appearance: Normal appearance. He is well-developed. He is not diaphoretic.  HENT:     Head: Normocephalic and atraumatic.     Right Ear: External ear normal.     Left Ear: External ear normal.     Nose: Nose normal.     Mouth/Throat:     Pharynx: No oropharyngeal exudate.  Eyes:     General: No scleral icterus.       Right eye: No discharge.        Left eye: No discharge.     Conjunctiva/sclera: Conjunctivae normal.     Pupils: Pupils are equal, round, and reactive to light.  Neck:     Thyroid : No thyromegaly.     Vascular: No JVD.     Trachea: No tracheal deviation.  Cardiovascular:     Rate and Rhythm: Normal rate and regular rhythm.     Pulses: Normal pulses.     Heart sounds: No murmur heard.    No friction rub. No gallop.  Pulmonary:     Effort: Pulmonary effort is normal. No respiratory distress.     Breath sounds: Normal breath sounds. No wheezing or rales.  Chest:     Chest wall: No tenderness.  Abdominal:     General: Bowel sounds are normal.  There is no distension.     Palpations: Abdomen is soft. There is no mass.     Tenderness: There is no abdominal tenderness. There is no guarding or rebound.  Genitourinary:    Penis: No tenderness.   Musculoskeletal:        General: No tenderness. Normal range of motion.     Cervical back: Neck supple.  Lymphadenopathy:     Cervical: No cervical adenopathy.  Skin:    General: Skin is warm and dry.     Coloration: Skin is not pale.     Findings: No erythema or rash.  Neurological:     General: No focal deficit present.     Mental Status: He is alert and oriented to person, place, and time.     Cranial Nerves: No cranial nerve deficit.     Motor: No abnormal muscle tone.     Coordination: Coordination normal.     Deep Tendon Reflexes: Reflexes are normal and symmetric. Reflexes normal.  Psychiatric:        Mood and Affect: Mood normal.        Behavior: Behavior normal.  Thought Content: Thought content normal.        Judgment: Judgment normal.           Assessment & Plan:  His CAD and HTN and CHF seem to be stable. His ESRD is stable as a result of the dialysis. His diabetic retinopathy seems to be stable. We will get labs to check lipids, an A1c, etc. We ordered a Cologuard test since he declines a colonoscopy. I personally spent a total of 35 minutes in the care of the patient today including getting/reviewing separately obtained history, performing a medically appropriate exam/evaluation, placing orders, independently interpreting results, and coordinating care.  Garnette Olmsted, MD

## 2024-05-26 NOTE — Telephone Encounter (Signed)
 CRITICAL VALUE STICKER  CRITICAL VALUE: Creatinine 6.72, GFR 8.14  RECEIVER (on-site recipient of call): Collie Hind, BSN, RN  DATE & TIME NOTIFIED:  05/26/2024 1151  MESSENGER (representative from lab): [facility]  MD NOTIFIED: Dr. Johnny  TIME OF NOTIFICATION: 05/26/2024 1152  RESPONSE:  Aware

## 2024-05-26 NOTE — Telephone Encounter (Signed)
 This is typical for him since he is on dialysis

## 2024-05-27 ENCOUNTER — Ambulatory Visit: Payer: Self-pay | Admitting: Family Medicine

## 2024-05-29 DIAGNOSIS — N2581 Secondary hyperparathyroidism of renal origin: Secondary | ICD-10-CM | POA: Diagnosis not present

## 2024-05-29 DIAGNOSIS — N186 End stage renal disease: Secondary | ICD-10-CM | POA: Diagnosis not present

## 2024-05-29 DIAGNOSIS — Z992 Dependence on renal dialysis: Secondary | ICD-10-CM | POA: Diagnosis not present

## 2024-06-01 DIAGNOSIS — Z992 Dependence on renal dialysis: Secondary | ICD-10-CM | POA: Diagnosis not present

## 2024-06-01 DIAGNOSIS — N2581 Secondary hyperparathyroidism of renal origin: Secondary | ICD-10-CM | POA: Diagnosis not present

## 2024-06-01 DIAGNOSIS — N186 End stage renal disease: Secondary | ICD-10-CM | POA: Diagnosis not present

## 2024-06-02 LAB — POCT INR: INR: 2.3 (ref 2.0–3.0)

## 2024-06-03 ENCOUNTER — Ambulatory Visit (HOSPITAL_COMMUNITY): Payer: Self-pay | Admitting: Pharmacist

## 2024-06-04 ENCOUNTER — Telehealth (INDEPENDENT_AMBULATORY_CARE_PROVIDER_SITE_OTHER): Admitting: Family

## 2024-06-04 ENCOUNTER — Encounter: Payer: Self-pay | Admitting: Family Medicine

## 2024-06-04 ENCOUNTER — Other Ambulatory Visit: Payer: Self-pay

## 2024-06-04 ENCOUNTER — Ambulatory Visit (HOSPITAL_COMMUNITY)
Admission: RE | Admit: 2024-06-04 | Discharge: 2024-06-04 | Disposition: A | Discharge status: Home / Self Care | Source: Ambulatory Visit | Attending: Cardiology | Admitting: Cardiology

## 2024-06-04 VITALS — BP 114/101 | HR 89 | Ht 70.0 in | Wt 190.6 lb

## 2024-06-04 DIAGNOSIS — Y712 Prosthetic and other implants, materials and accessory cardiovascular devices associated with adverse incidents: Secondary | ICD-10-CM | POA: Insufficient documentation

## 2024-06-04 DIAGNOSIS — D631 Anemia in chronic kidney disease: Secondary | ICD-10-CM | POA: Insufficient documentation

## 2024-06-04 DIAGNOSIS — I4892 Unspecified atrial flutter: Secondary | ICD-10-CM | POA: Insufficient documentation

## 2024-06-04 DIAGNOSIS — G540 Brachial plexus disorders: Secondary | ICD-10-CM | POA: Diagnosis not present

## 2024-06-04 DIAGNOSIS — Z4509 Encounter for adjustment and management of other cardiac device: Secondary | ICD-10-CM | POA: Diagnosis not present

## 2024-06-04 DIAGNOSIS — Z792 Long term (current) use of antibiotics: Secondary | ICD-10-CM | POA: Insufficient documentation

## 2024-06-04 DIAGNOSIS — Z992 Dependence on renal dialysis: Secondary | ICD-10-CM | POA: Insufficient documentation

## 2024-06-04 DIAGNOSIS — I132 Hypertensive heart and chronic kidney disease with heart failure and with stage 5 chronic kidney disease, or end stage renal disease: Secondary | ICD-10-CM | POA: Insufficient documentation

## 2024-06-04 DIAGNOSIS — Y838 Other surgical procedures as the cause of abnormal reaction of the patient, or of later complication, without mention of misadventure at the time of the procedure: Secondary | ICD-10-CM | POA: Diagnosis not present

## 2024-06-04 DIAGNOSIS — I5023 Acute on chronic systolic (congestive) heart failure: Secondary | ICD-10-CM | POA: Diagnosis not present

## 2024-06-04 DIAGNOSIS — Z95811 Presence of heart assist device: Secondary | ICD-10-CM | POA: Diagnosis not present

## 2024-06-04 DIAGNOSIS — N990 Postprocedural (acute) (chronic) kidney failure: Secondary | ICD-10-CM | POA: Diagnosis not present

## 2024-06-04 DIAGNOSIS — Z794 Long term (current) use of insulin: Secondary | ICD-10-CM | POA: Insufficient documentation

## 2024-06-04 DIAGNOSIS — T827XXD Infection and inflammatory reaction due to other cardiac and vascular devices, implants and grafts, subsequent encounter: Secondary | ICD-10-CM

## 2024-06-04 DIAGNOSIS — I5022 Chronic systolic (congestive) heart failure: Secondary | ICD-10-CM | POA: Insufficient documentation

## 2024-06-04 DIAGNOSIS — I2510 Atherosclerotic heart disease of native coronary artery without angina pectoris: Secondary | ICD-10-CM | POA: Insufficient documentation

## 2024-06-04 DIAGNOSIS — B9561 Methicillin susceptible Staphylococcus aureus infection as the cause of diseases classified elsewhere: Secondary | ICD-10-CM | POA: Insufficient documentation

## 2024-06-04 DIAGNOSIS — I255 Ischemic cardiomyopathy: Secondary | ICD-10-CM | POA: Insufficient documentation

## 2024-06-04 DIAGNOSIS — E1122 Type 2 diabetes mellitus with diabetic chronic kidney disease: Secondary | ICD-10-CM | POA: Diagnosis not present

## 2024-06-04 DIAGNOSIS — Z95810 Presence of automatic (implantable) cardiac defibrillator: Secondary | ICD-10-CM | POA: Diagnosis not present

## 2024-06-04 DIAGNOSIS — N186 End stage renal disease: Secondary | ICD-10-CM | POA: Insufficient documentation

## 2024-06-04 DIAGNOSIS — Z7901 Long term (current) use of anticoagulants: Secondary | ICD-10-CM | POA: Diagnosis not present

## 2024-06-04 DIAGNOSIS — S143XXD Injury of brachial plexus, subsequent encounter: Secondary | ICD-10-CM | POA: Insufficient documentation

## 2024-06-04 DIAGNOSIS — A4901 Methicillin susceptible Staphylococcus aureus infection, unspecified site: Secondary | ICD-10-CM

## 2024-06-04 DIAGNOSIS — D696 Thrombocytopenia, unspecified: Secondary | ICD-10-CM | POA: Insufficient documentation

## 2024-06-04 DIAGNOSIS — Z951 Presence of aortocoronary bypass graft: Secondary | ICD-10-CM | POA: Diagnosis not present

## 2024-06-04 DIAGNOSIS — R319 Hematuria, unspecified: Secondary | ICD-10-CM | POA: Insufficient documentation

## 2024-06-04 DIAGNOSIS — Z79899 Other long term (current) drug therapy: Secondary | ICD-10-CM | POA: Insufficient documentation

## 2024-06-04 DIAGNOSIS — I4891 Unspecified atrial fibrillation: Secondary | ICD-10-CM | POA: Diagnosis not present

## 2024-06-04 DIAGNOSIS — M79641 Pain in right hand: Secondary | ICD-10-CM | POA: Diagnosis not present

## 2024-06-04 DIAGNOSIS — T827XXA Infection and inflammatory reaction due to other cardiac and vascular devices, implants and grafts, initial encounter: Secondary | ICD-10-CM

## 2024-06-04 NOTE — Patient Instructions (Signed)
 No change in medications  Return to clinic in 2 months
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
## 2024-06-04 NOTE — Progress Notes (Signed)
 HF Cardiology: Dr. Rolan  Chief complaint: LVAD followup  Marcus Johnson. is a 63 y.o. male who has a h/o DM2, CAD s/p CABG 2002, systolic HF due to ischemic cardiomyopathy with EF 20-25% (echo 12/15), DM2 and CKD. He is s/p Medtronic single chamber ICD.   Admitted in 12/15 due to ADHF. Required short course milrinone  for diuresis. Diuresed 30 pounds.    CPX 2/16 showed severely reduced functional capacity.  There was a significant disconnect between his symptoms and the CPX.  RHC in 6/16 showed fairly normal filling pressures and low but not markedly low cardiac output.    CPX (4/19) was submaximal but suggested severe limitation from HF.    He was admitted in 6/19 with marked volume overload and dyspnea.  Echo in 6/19 showed EF 20% with severe global hypokinesis.  LHC/RHC showed severe native vessel CAD with patent LIMA-D, SVG-OM, and SVG-PLV; cardiac index low.  He was started on milrinone  and diuresed.  We discussed LVAD extensively in light of low output, creatinine that is trending up and concern for decompensation of RV over time. He was adamant that he did not want to pursue LVAD workup yet and did not want a referral for transplant evaluation. Milrinone  was weaned off and he was discharged home.    Admitted 08/01/18-08/07/18 with volume overload and left foot wound. Diuresed 38 lbs with IV lasix  and then transitioned back to torsemide  100 mg BID. ID was consulted with concerns for left foot osteomyelitis noted on CT scan. He got IV ancef  q8 hours x 42 days. [facility] provided teaching and supplies for home antibiotics. Course complicated by AKI. DC weight 200 lbs.    He went into atrial flutter and had DCCV to NSR in 1/20.    Echo in 4/21 showed EF 25% with apical akinesis and diffuse hypokinesis relatively preserved in lateral wall, mildly decreased RV systolic function, PASP 41 mmHg. CPX in 5/21 showed severe functional limitation suggestive of advanced HF. 5/21 Zio patch showed  short atrial flutter runs, 3.8% PVCs.    He developed COVID-19 infection in 1/22.  Symptoms were predominantly GI.    He developed progressive worsening of CHF in early 2022 and was admitted in 3/22 with low output HF.  Impella 5.5 was placed as bridge to Heartmate III LVAD placed in 10/13/20.  He developed post-op renal failure and ended up on dialysis.  He was markedly deconditioned post-op and went to CIR.    Still has pain and minimal function in his right hand.  Nerve conduction study showed a chronic sensorimotor neuropathy in the right arm.    Ramp echo in 3/23 showed severe LV dysfunction with mild RV enlargement and mild dysfunction, mild-moderate AI.  Speed was increased to 5800 rpm.  The aortic valve still opened with every beat but only slightly. Flow increased from 4.1 to 5.3 L/min and PI remained stable.   Patient was admitted in 4/23 with falls and lightheadedness. He fractured his left foot when falling and initially was unable to walk.  He was thought to be hypotensive due to over-diuresis and dry weight was increased.  Midodrine  on HD days was also increased.  He was treated for a driveline infection with vancomycin  then doxycycline , now off.   ICD shock for VT in 7/23, not started on amiodarone  given only 1 episode.   Echo in 3/24 showed EF 20-25% with midline IV septum, mild RV dysfunction with severe RVE, moderate AI.   In 5/24, patient had  VT treated with ATP.      Patient was shocked by his ICD for VT on 04/17/23, 04/20/23, and 04/21/23.  I had him increase his amiodarone  to 200 mg bid x 2 wks then back to 200 mg daily.  After discussion with Dr. Fernande, I started him on mexiletine as well.    Ramp echo was in 9/24.  Starting speed 5800 rpm with moderate AI, slight aortic valve opening each beat, moderate RV enlargement/moderate RV dysfunction, septum shifted slightly to the left.  Speed decreased to 5700 rpm with moderate AI, aortic valve opening each beat, midline septum with  flow fairly stable at 5 L/min.  I left speed at 5700 rpm.   With chronic thrombocytopenia, patient had hematology workup including bone marrow biopsy. The biopsy was unremarkable and platelets have trended back up.    He is also seeing urology for workup of hematuria.  Nephrolithiasis has been found.   Echo in 9/25 showed EF 20% with moderate RV enlargement/mild RV dysfunction, IV septum near midline, Aortic valve probably opens with each beat, moderate aortic insufficiency, moderate TR, IVC dilated.   Patient returns for followup of LVAD.  Using midodrine  only on non-HD days.  No clinical changes, no dyspnea with usual activities.  No lightheadedness.  Weight stable.  He feels like the cephalexin  makes him nauseated.     Labs (3/24): LDH 222, hgb 11.2 Labs (6/24): LDH 219, hgb 11.3, TSH normal Labs (9/24): TSH normal, hgb 12, LFTs normal, LDH 215 Labs (1/25): LFTs normal, TSH normal Labs (3/25): LFTs normal, TSH normal, LDH 200, hgb 11.3, plts 115 Labs (6/25): LDH 194, LFTs normal Labs (7/25): hgb 11.2, plts 145 => 132, LDH 214  Past Medical History:  Diagnosis Date   AICD (automatic cardioverter/defibrillator) present    Single-chamber  implantable cardiac defibrillator - Medtronic   Atrial fibrillation (HCC)    Cataract    Mixed form OU   CHF (congestive heart failure) (HCC)    Chronic kidney disease    Chronic kidney disease (CKD), stage III (moderate) (HCC)    Chronic systolic heart failure (HCC)    a. Echo 5/13: Mild LVE, mild LVH, EF 10%, anteroseptal, lateral, apical AK, mild MR, mild LAE, moderately reduced RVSF, mild RAE, PASP 60;  b. 07/2014 Echo: EF 20-2%, diff HK, AKI of antsep/apical/mid-apicalinferior, mod reduced RV.   Coronary artery disease    a. s/p CABG 2002 b. LHC 5/13:  dLM 80%, LAD subtotally occluded, pCFX occluded, pRCA 50%, mid? Occlusion with high take off of the PDA with 70% multiple lesions-not bypassed and supplies collaterals to LAD, LIMA-IM/ramus ok,  S-OM ok, S-PLA branches ok. Medical therapy was recommended   COVID    Diabetic retinopathy (HCC)    NPDR OD, PDR OS   Dyspnea    Gout    on daily RX (01/08/2018)   Hypertension    Hypertensive retinopathy    OU   Ischemic cardiomyopathy    a. Echo 5/13: Mild LVE, mild LVH, EF 10%, anteroseptal, lateral, apical AK, mild MR, mild LAE, moderately reduced RVSF, mild RAE, PASP 60;  b. 01/2012 s/p MDT [id number] Protecta XT VR AICD;  c. 07/2014 Echo: EF 20-2%, diff HK, AKI of antsep/apical/mid-apicalinferior, mod reduced RV.   MRSA (methicillin resistant Staphylococcus aureus)    Status post right foot plantar deep infection with MRSA status post  I&D 10/2008   Myocardial infarction [facility])    was told I'd had several before heart OR in 2002 (01/08/2018)  Noncompliance    Peripheral neuropathy    Retinopathy, diabetic, background (HCC)    Syncope    Type II diabetes mellitus (HCC)    requiring insulin     Vitreous hemorrhage, left (HCC)    and proliferative diabetic retinopathy    Current Outpatient Medications  Medication Sig Dispense Refill   ACCU-CHEK GUIDE TEST test strip USE AS INSTRUCTED 3 TIMES DAILY 100 strip 1   acetaminophen  (TYLENOL ) 325 MG tablet Take by mouth.     amiodarone  (PACERONE ) 200 MG tablet Take 1 tablet (200 mg total) by mouth 2 (two) times daily. Take 1 tablet (200 mg) by mouth twice daily for 2 weeks starting 04/23/23, then decrease to 1 tablet (200 mg) daily 05/07/23. 90 tablet 3   BD PEN NEEDLE NANO 2ND GEN 32G X 4 MM MISC USE TO INJECT INSULIN  DAILY 899 each 0   Blood Glucose Monitoring Suppl (ONE TOUCH ULTRA 2) w/Device KIT 1 Device by Does not apply route 3 (three) times daily. 1 kit 0   cephALEXin  (KEFLEX ) 500 MG capsule TAKE 1 CAPSULE BY MOUTH 2 (TWO) TIMES DAILY. TAKE EVENING DOSE AFTER DIALYSIS ON HD DAYS. 60 capsule 0   finasteride (PROSCAR) 5 MG tablet Take 5 mg by mouth daily.     gabapentin  (NEURONTIN ) 100 MG capsule TAKE 1 CAPSULE ( 100 MG) IN THE MORNING  AND 2 CAPSULES ( 200 MG) AT BEDTIME (Patient taking differently: Take 100 mg by mouth at bedtime.) 90 capsule 3   insulin  glargine (LANTUS  SOLOSTAR) 100 UNIT/ML Solostar Pen INJECT 60 UNITS INTO THE SKIN AT BEDTIME. 15 mL 23   Lancets (ONETOUCH DELICA PLUS LANCET30G) MISC 1 each by Does not apply route 4 (four) times daily -  before meals and at bedtime. 100 each 3   mexiletine (MEXITIL) 150 MG capsule TAKE 1 CAPSULE BY MOUTH TWICE A DAY 180 capsule 3   midodrine  (PROAMATINE ) 10 MG tablet TAKE 2 TABLETS 3 TIMES DAILY ON DIALYSIS DAYS NO MIDODRINE  ON NON DIALYSIS DAYS 240 tablet 7   multivitamin (RENA-VIT) TABS tablet TAKE 1 TABLET BY MOUTH EVERYDAY AT BEDTIME 90 tablet 3   ondansetron  (ZOFRAN ) 4 MG tablet Take 1 tablet (4 mg total) by mouth every 8 (eight) hours as needed for nausea or vomiting. 30 tablet 3   pantoprazole  (PROTONIX ) 40 MG tablet TAKE 1 TABLET BY MOUTH EVERY DAY 90 tablet 3   polyethylene glycol powder (GLYCOLAX /MIRALAX ) 17 GM/SCOOP powder      rosuvastatin  (CRESTOR ) 10 MG tablet Take 1 tablet (10 mg total) by mouth daily. 90 tablet 3   sevelamer (RENAGEL) 800 MG tablet Take 800 mg by mouth 3 (three) times daily.     traMADol  (ULTRAM ) 50 MG tablet TAKE 1 TABLET BY MOUTH EVERY 12 HOURS AS NEEDED 30 tablet 3   warfarin (COUMADIN ) 3 MG tablet TAKE 4.5 MG (1.5 TABS) DAILY OR AS DIRECTED BY HF CLINIC 150 tablet 3   ketorolac (ACULAR) 0.5 % ophthalmic solution Place 1 drop into the left eye 4 (four) times daily. (Patient not taking: Reported on 06/04/2024)     moxifloxacin (VIGAMOX) 0.5 % ophthalmic solution Apply 1 drop to eye 3 (three) times daily. (Patient not taking: Reported on 06/04/2024)     naphazoline-glycerin  (CLEAR EYES REDNESS) 0.012-0.25 % SOLN Place 1-2 drops into the right eye 4 (four) times daily as needed for eye irritation. (Patient not taking: Reported on 06/04/2024)     prednisoLONE  acetate (PRED FORTE ) 1 % ophthalmic suspension 1 drop 4 (four)  times daily. (Patient not  taking: Reported on 06/04/2024)     No current facility-administered medications for this encounter.    Meclizine  hcl and Ivabradine   REVIEW OF SYSTEMS: All systems negative except as listed in HPI, PMH and Problem list.  LVAD INTERROGATION:  LVAD Documentation    06/04/2024  Device Info  LVAD Type: Heartmate III  Date of Implant: 10/13/2020  Therapy Type: Destination Therapy      06/04/2024  Vitals  Heart Rate: 89 BPM  Automatic BP: 114/101  Doppler MAP: 118 mmHg  SpO2: 97 %    Last 3 Weights Weight Weight  06/04/2024 86.456 kg 190 lb 9.6 oz  05/26/2024 84.823 kg 187 lb  04/16/2024 86.456 kg 190 lb 9.6 oz       06/04/2024  LVAD Paramaters  Speed: 5700 RPM  Flow: 5 LPM  PI: 3  Power: 5 Watts  Hematocrit: 37 %  Alarms: none  Events: 10-40  Last Speed Change Date: 05/02/2023  Last Ramp Echo Date: 05/02/2023  Last Right Heart Cath Date: 01/20/2018  Bleeding History: Yes  Type of Bleeding Hx: Other  Type of Bleeding Hx: hematuria  Type of Dressing: Other  Type of Dressing: Weekly using daily kit  Annual Maintenance Date: 10/14/2023    Labs    Units 06/02/24 0000 05/26/24 0845 05/26/24 0000 05/19/24 0000 02/18/24 0000 02/11/24 0655 01/14/24 0000 01/07/24 1318 12/31/23 0000 12/24/23 0835 11/01/23 0000 10/29/23 0853  INR  2.3  --  2.0 2.0   < >  --    < >  --    < > 1.5*   < > 2.0*  LDH U/L  --   --   --   --   --   --   --  194*  --  178  --  200*  HGB g/dL  --  88.0*  --   --   --  11.1*  --  10.8*  --  10.9*   < > 11.3*  CREATININE mg/dL  --  3.27*  --   --   --   --   --   --   --  5.38*  --  5.33*   < > = values in this interval not displayed.        Physical Exam: MAP 108 General: Well appearing this am. NAD.  HEENT: Normal. Neck: Supple, JVP 7-8 cm. Carotids OK.  Cardiac:  Mechanical heart sounds with LVAD hum present.  Lungs:  CTAB, normal effort.  Abdomen:  NT, ND, no HSM. No bruits or masses. +BS  LVAD exit site: Well-healed and  incorporated. Dressing dry and intact. No erythema or drainage. Stabilization device present and accurately applied. Driveline dressing changed daily per sterile technique. Extremities:  Warm and dry. No cyanosis, clubbing, rash, or edema.  Neuro:  Alert & oriented x 3. Cranial nerves grossly intact. Moves all 4 extremities w/o difficulty. Affect pleasant    ASSESSMENT AND PLAN:  1. Chronic systolic CHF:  Ischemic cardiomyopathy. Has Medtronic ICD. Low output HF, admitted and Impella 5.5 placed 10/04/20.  HM3 LVAD was placed on 10/13/20. Developed post-op renal failure requiring eventual hemodialysis.  Ramp echo in 3/23, speed increased to 5800 rpm under echo guidance with increase in flow and stable PI.  Echo in 3/24 showed EF 20-25% with midline IV septum, mild RV dysfunction with severe RVE, moderate AI. Ramp echo in 9/24 with decrease in speed in setting of VT episodes.  Echo today showed EF 20%  with moderate RV enlargement/mild RV dysfunction, IV septum near midline, Aortic valve probably opens with each beat, moderate aortic insufficiency, moderate TR, IVC dilated. MAP elevated on non-HD days, takes midodrine  on HD days. No further VT.  NYHA class II, not volume overloaded on exam.  LVAD interrogation shows PI events on HD days, no low flows/suction.  - Continue midodrine  on HD days and reminded him not to take midodrine  on the non-HD days.    - He did not tolerate hydralazine  on non-HD days for elevated MAP.  2. LVAD:  VAD interrogated personally.  Parameters stable with a few PI events on HD days. No low flow events or suction.  - He is now off ASA.  - Continue warfarin with INR goal 2-2.5.   3. CAD s/p CABG 2002:  Last cath in 6/19 with patent grafts. No chest pain.  - Continue Crestor   4. ESRD: Currently dialyzing via tunneled catheter. No low flow events.  - Continue HD M-W-F  - With continuous flow LVAD, think AV fistula would be unlikely to mature.   5. Atrial flutter/fibrillation: s/p  DC-CV 08/21/18.  Has occasional episodes of AF but not prolonged.  - Continue amiodarone , follow LFTs and TSH.  He will need regular eye exam.  6. 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 This has been improving though not completely normal.   7. DM: Per primary care.  8. Anemia: no BRBPR/melena.  - CBC today.  9. Suspected Gastroparesis: He is no longer taking Reglan .  10. VT: Episode in 7/23 with successful ICD discharge.  No definite trigger, he did not actually feel the VT. VT again in 5/24 treated with ATP, amiodarone  started.  Episodes of VT 9/11, 9/14, and 04/21/23.  Ramp echo in 9/24 with decreased speed.  No further VT.  - Continue amiodarone  200 mg daily.  Check LFTs/TSH as above.  - Continue mexiletine 150 mg bid.  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 Driveline site looks good today. He thinks Keflex  is making him nauseated, will be seeing ID this week to discuss.  12. Thrombocytopenia: Chronic.  Platelets trending up recently.  Bone marrow biopsy unremarkable.   - Sees hematology.  13. Hematuria: ?from nephrolithiasis.     Followup 2 months.   I spent 41 minutes reviewing records, interviewing/examining patient, and managing orders.    Ezra Shuck 06/04/2024
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
## 2024-06-04 NOTE — Progress Notes (Signed)
 Patient presents for 2 month follow up in VAD clinic with his brother Silver. Reports no problems with VAD equipment or concerns with drive line.  Patient arrived via wheelchair today but was able to step on the scale for his weight. Pt denies dizziness, falls, and heart failure symptoms. Complains of intermittent lightheadedness and fatigue on dialysis days. Still experiencing some intermittent nausea on HD days, but this has improved with Zofran .  Pt reports hematuria has resolved.  Pt had cataract removal in left eye at [facility] 12/19/23. Pt states he will be having his right eye done soon. Follows with Ophthalmology for continued treatment and discussion right eye cataract removal.   Pt has not had any recent ICD shocks and is taking his Amiodarone  200 BID and Mexiletine 150 mg BID as prescribed.   Patient reports he continues M/W/F dialysis. Pt confirms he takes Midodrine  TID on HD days only.   Pt on Keflex  500 mg BID (dosed for HD schedule). Silver reports they are changing dressing weekly using daily kit. Currently wearing 2 anchors. Reports he wears binder at home, but does not like to wear during dialysis. Pt has an appt with ID tomorrow.   Vital Signs:  Doppler Pressure: 118 Automatic BP: 114/101 (108) HR: 89 NSR O2: 97% RA   Weight: 190.6 lbs w/ eqt Last wt: 190.6 w/ eqt   VAD Indication: Destination Therapy due to CKD Stage 3b   LVAD assessment:  HM III: Speed: 5700 rpms Flow: 5.2 Power: 4.7 w    PI: 3.1 Alarms: none Events: 10-40  Fixed speed:  5700 Low speed limit:  5400  Primary controller: Back up battery due for replacement in 12 months Secondary controller: Back up battery due for replacement in 24 months  I reviewed the LVAD parameters from today and compared the results to the patient's prior recorded data. LVAD interrogation was NEGATIVE for significant power changes, NEGATIVE for clinical alarms and STABLE for PI events/speed drops. No programming changes  were made and pump is functioning within specified parameters. Pt is performing daily controller and system monitor self tests along with completing weekly and monthly maintenance for LVAD equipment.   LVAD equipment check completed and is in good working order. Back-up equipment present and back up battery charged at this appointment.   Annual Equipment Maintenance on UBC/PM was performed 10/29/23.     Exit Site Care: CDI. Drive line anchor re-applied over top of small tegaderm to prevent irritation. Pt denies fever or chills. Pt given 8 daily kits, 10 anchors, and small tegaderms for home use.   Device: Medtronic single Therapies: VF: 240 VT: 200 Slow VT: 143 Pacing: VVI 40 Last check: 01/07/24   BP & Labs: Labs from 05/26/24 Doppler 118 - Doppler is reflecting modified systolic  Hgb 11.9 - No S/S of bleeding. Specifically denies melena/BRBPR or nosebleeds.   LDH 276- established baseline of 160 - 180. Denies tea-colored urine. No power elevations noted on interrogation.   Plan: No medication changes Follow up in 2 months with Dr. Rolan Lauraine Ip RN, BSN VAD Coordinator 24/7 Pager [phone]

## 2024-06-05 ENCOUNTER — Encounter: Payer: Self-pay | Admitting: Family

## 2024-06-05 DIAGNOSIS — Z992 Dependence on renal dialysis: Secondary | ICD-10-CM | POA: Diagnosis not present

## 2024-06-05 DIAGNOSIS — I509 Heart failure, unspecified: Secondary | ICD-10-CM | POA: Diagnosis not present

## 2024-06-05 DIAGNOSIS — N186 End stage renal disease: Secondary | ICD-10-CM | POA: Diagnosis not present

## 2024-06-05 DIAGNOSIS — N2581 Secondary hyperparathyroidism of renal origin: Secondary | ICD-10-CM | POA: Diagnosis not present

## 2024-06-05 MED ORDER — CEPHALEXIN 500 MG PO CAPS: 500.0000 mg | ORAL_CAPSULE | Freq: Every day | ORAL | 11 refills | Status: AC

## 2024-06-05 NOTE — Patient Instructions (Signed)
 Nice to see you.  Dose of cephalexin  changed to daily.   Take after dialysis on dialysis days.   Continue drive line care per LVAD team.   Plan for follow up in 6 months or sooner if needed.   Have a great day and stay safe!

## 2024-06-05 NOTE — Assessment & Plan Note (Signed)
 End stage renal disease on HD with Cephalexin  renally dosed for HD. Discussed with ID pharmacist and can lower dose of Cephalexin  to 500 mg daily with dose of medication taken after dialysis on dialysis days. If nausea is medication related this should help.

## 2024-06-05 NOTE — Assessment & Plan Note (Deleted)
 LVAD driveline with no evidence of new infection and appears well suppressed with current dose of cephalexin .  Continue cephalexin  for MSSA suppression with driveline care per LVAD team.  Plan for follow-up in 6 months or sooner if needed.

## 2024-06-05 NOTE — Assessment & Plan Note (Signed)
 LVAD driveline with no evidence of new infection and appears well suppressed with current dose of cephalexin .  Continue cephalexin  for MSSA suppression with driveline care per LVAD team.  Plan for follow-up in 6 months or sooner if needed.

## 2024-06-08 DIAGNOSIS — N186 End stage renal disease: Secondary | ICD-10-CM | POA: Diagnosis not present

## 2024-06-08 DIAGNOSIS — N2581 Secondary hyperparathyroidism of renal origin: Secondary | ICD-10-CM | POA: Diagnosis not present

## 2024-06-08 DIAGNOSIS — Z992 Dependence on renal dialysis: Secondary | ICD-10-CM | POA: Diagnosis not present

## 2024-06-08 DIAGNOSIS — E8770 Fluid overload, unspecified: Secondary | ICD-10-CM | POA: Diagnosis not present

## 2024-06-09 ENCOUNTER — Ambulatory Visit (HOSPITAL_COMMUNITY): Payer: Self-pay | Admitting: Pharmacist

## 2024-06-09 ENCOUNTER — Other Ambulatory Visit: Payer: Self-pay

## 2024-06-09 DIAGNOSIS — E1129 Type 2 diabetes mellitus with other diabetic kidney complication: Secondary | ICD-10-CM

## 2024-06-09 LAB — POCT INR: INR: 2.1 (ref 2.0–3.0)
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
## 2024-06-09 NOTE — Progress Notes (Signed)
   06/09/2024  Patient ID: Ryan Manuelita Claudene Mickey., male   DOB: Dec 20, 1960, 63 y.o.   MRN: [id number]  Pharmacy Quality Measure Review  This patient is appearing on a report for being at risk of failing the Glycemic Status Assessment in Diabetes measure this calendar year.   Last documented A1c 6.6 on 05/26/24  Jon VEAR [place], PharmD Clinical Pharmacist [phone]

## 2024-06-12 DIAGNOSIS — Z992 Dependence on renal dialysis: Secondary | ICD-10-CM | POA: Diagnosis not present

## 2024-06-15 ENCOUNTER — Encounter (HOSPITAL_BASED_OUTPATIENT_CLINIC_OR_DEPARTMENT_OTHER): Payer: Self-pay | Admitting: Family Medicine

## 2024-06-15 ENCOUNTER — Encounter: Payer: Self-pay | Admitting: Family Medicine

## 2024-06-15 ENCOUNTER — Ambulatory Visit (INDEPENDENT_AMBULATORY_CARE_PROVIDER_SITE_OTHER): Admitting: Family Medicine

## 2024-06-15 ENCOUNTER — Ambulatory Visit: Payer: Self-pay

## 2024-06-15 VITALS — BP 103/81 | HR 53 | Ht 70.0 in

## 2024-06-15 DIAGNOSIS — N489 Disorder of penis, unspecified: Secondary | ICD-10-CM | POA: Diagnosis not present

## 2024-06-15 MED ORDER — AZITHROMYCIN 250 MG PO TABS: 1000.0000 mg | ORAL_TABLET | Freq: Once | ORAL | 0 refills | Status: AC

## 2024-06-15 NOTE — Patient Instructions (Signed)
  Medication Instructions:  Your physician recommends that you continue on your current medications as directed. Please refer to the Current Medication list given to you today. --If you need a refill on any your medications before your next appointment, please call your pharmacy first. If no refills are authorized on file call the office.-- Lab Work: Your physician has recommended that you have lab work today: yes If you have labs (blood work) drawn today and your tests are completely normal, you will receive your results via MyChart message OR a phone call from our staff.  Please ensure you check your voicemail in the event that you authorized detailed messages to be left on a delegated number. If you have any lab test that is abnormal or we need to change your treatment, we will call you to review the results.  Referrals/Procedures/Imaging: no  Follow-Up: Your next appointment:   Your physician recommends that you schedule a follow-up appointment in: as needed with Dr. de [place]. Follow up with main primary care provider.  You will receive a text message or e-mail with a link to a survey about your care and experience with us  today! We would greatly appreciate your feedback!   Thanks for letting us  be apart of your health journey!!  Primary Care and Sports Medicine   Dr. Quintin sheerer [place]   We encourage you to activate your patient portal called MyChart.  Sign up information is provided on this After Visit Summary.  MyChart is used to connect with patients for Virtual Visits (Telemedicine).  Patients are able to view lab/test results, encounter notes, upcoming appointments, etc.  Non-urgent messages can be sent to your provider as well. To learn more about what you can do with MyChart, please visit --  [email or web address].

## 2024-06-15 NOTE — Assessment & Plan Note (Signed)
 Acute painful penile lesion with a white bump for five days. - Prescribed azithromycin  single dose with food. - Performed blood-based testing to exclude other organisms. - Conducted lesion swab for further testing. - Advised follow-up with primary care provider in 1-2 weeks.

## 2024-06-15 NOTE — Telephone Encounter (Signed)
 FYI Only or Action Required?: FYI only for provider: appointment scheduled on 06/15/24.  Patient was last seen in primary care on 05/26/2024 by Johnny Garnette LABOR, MD.  Called Nurse Triage reporting Groin Swelling.  Symptoms began today.  Interventions attempted: Nothing.  Symptoms are: unchanged.  Triage Disposition: See Physician Within 24 Hours  Patient/caregiver understands and will follow disposition?: Yes  Copied from CRM #[id number]. Topic: Clinical - Red Word Triage >> Jun 15, 2024 11:58 AM Victoria A wrote: [facility] that prompted transfer to Nurse Triage: Patient has pain in private area has been ongoing for 5 days. Reason for Disposition  Rash with painful tiny water  blisters or painful sores  Answer Assessment - Initial Assessment Questions No available appt with pcp; scheduled with alt provider 06/15/24.  Advised call back or UC/ ED if symptoms worsen.  1. SYMPTOM: What's the main symptom you're concerned about? (e.g., blood in semen, discharge or pus from penis, itching, pain, rash, swelling)     Painful, denies discharge, bleeding, diff urinating, rash, swelling 2. LOCATION: Where is the  located?     2 white bumps, with pus 3. ONSET: When did   start?     5 days ago 4. PAIN: Is there any pain? If Yes, ask: How bad is it?  (Scale 1-10; or mild, moderate, severe)     8/10 5. URINE: Any difficulty passing urine? If Yes, ask: When was the last time?     Hemodialysis today 6. CAUSE: What do you think is causing the symptoms?     unsure 7. OTHER SYMPTOMS: Do you have any other symptoms? (e.g., blood in urine, abdomen pain, fever)     Denies fever, chills, n/v, abd pain  Protocols used: Penis and Scrotum Symptoms-A-AH

## 2024-06-15 NOTE — Progress Notes (Signed)
    Procedures performed today:    None.  Independent interpretation of notes and tests performed by another provider:   None.  Brief History, Exam, Impression, and Recommendations:    BP 103/81 (BP Location: Right Arm, Patient Position: Sitting, Cuff Size: Normal)   Pulse (!) 53   Ht 5' 10 (1.778 m)   BMI 27.35 kg/m   Discussed the use of AI scribe software for clinical note transcription with the patient, who gave verbal consent to proceed.  History of Present Illness Marcus Johnson. is a 63 year old male who presents with a painful white bump on his penis.  He developed a painful white bump on his penis approximately five days ago, which began as red spots and quickly evolved into a single white bump. The pain is significant enough to disrupt his sleep. No swelling in the groin, pain or burning with urination, discharge, or drainage is present.  He recalls a similar issue about eight to ten years ago, which was treated with an unspecified medication, possibly an antibiotic or cream. He does not remember the exact diagnosis from that time.  His urination flow is minimized due to dialysis, which has affected his kidney function. He has not had any recent changes to his medications and denies any fever, chills, or sweats. His current medications include cephalexin  for his heart condition. He has allergies to meclizine  and another unspecified medication, which cause him to break out.  On exam, patient has lesion over glans of penis with slightly erythematous base and white appearance overlying.  There is no obvious ulceration now, no bruising or drainage.  Does appear to be a thin film overlying lesion.  No urethral discharge noted.  Penile lesion Assessment & Plan: Acute painful penile lesion with a white bump for five days. - Prescribed azithromycin  single dose with food. - Performed blood-based testing to exclude other organisms. - Conducted lesion swab for further  testing. - Advised follow-up with primary care provider in 1-2 weeks.  Orders: -     Chlamydia/GC NAA, Confirmation -     RPR -     HIV Antibody (routine testing w rflx)  Other orders -     Azithromycin ; Take 4 tablets (1,000 mg total) by mouth once for 1 dose.  Dispense: 4 tablet; Refill: 0  Return if symptoms worsen or fail to improve.  Spent 32 minutes on this patient encounter, including preparation, chart review, face-to-face counseling with patient and coordination of care, and documentation of encounter   ___________________________________________ Ashelynn Marks de [place], MD, ABFM, [facility]

## 2024-06-16 ENCOUNTER — Ambulatory Visit (HOSPITAL_COMMUNITY): Payer: Self-pay | Admitting: Pharmacist

## 2024-06-16 ENCOUNTER — Ambulatory Visit: Payer: Self-pay | Admitting: Pharmacist

## 2024-06-16 ENCOUNTER — Ambulatory Visit (HOSPITAL_BASED_OUTPATIENT_CLINIC_OR_DEPARTMENT_OTHER): Payer: Self-pay | Admitting: Family Medicine

## 2024-06-16 ENCOUNTER — Ambulatory Visit (INDEPENDENT_AMBULATORY_CARE_PROVIDER_SITE_OTHER): Admitting: Ophthalmology

## 2024-06-16 DIAGNOSIS — H25811 Combined forms of age-related cataract, right eye: Secondary | ICD-10-CM | POA: Diagnosis not present

## 2024-06-16 DIAGNOSIS — I10 Essential (primary) hypertension: Secondary | ICD-10-CM

## 2024-06-16 DIAGNOSIS — H4312 Vitreous hemorrhage, left eye: Secondary | ICD-10-CM

## 2024-06-16 DIAGNOSIS — E113513 Type 2 diabetes mellitus with proliferative diabetic retinopathy with macular edema, bilateral: Secondary | ICD-10-CM | POA: Diagnosis not present

## 2024-06-16 DIAGNOSIS — H35033 Hypertensive retinopathy, bilateral: Secondary | ICD-10-CM | POA: Diagnosis not present

## 2024-06-16 DIAGNOSIS — Z794 Long term (current) use of insulin: Secondary | ICD-10-CM | POA: Diagnosis not present

## 2024-06-16 DIAGNOSIS — Z961 Presence of intraocular lens: Secondary | ICD-10-CM

## 2024-06-16 DIAGNOSIS — Z7901 Long term (current) use of anticoagulants: Secondary | ICD-10-CM | POA: Diagnosis not present

## 2024-06-16 LAB — CHLAMYDIA/GC NAA, CONFIRMATION
Chlamydia trachomatis, NAA: NEGATIVE
Neisseria gonorrhoeae, NAA: NEGATIVE

## 2024-06-16 LAB — POCT INR: INR: 2.1 (ref 2.0–3.0)

## 2024-06-16 LAB — RPR: RPR Ser Ql: NONREACTIVE

## 2024-06-16 LAB — HIV ANTIBODY (ROUTINE TESTING W REFLEX): HIV Screen 4th Generation wRfx: NONREACTIVE

## 2024-06-17 ENCOUNTER — Encounter (HOSPITAL_BASED_OUTPATIENT_CLINIC_OR_DEPARTMENT_OTHER): Payer: Self-pay | Admitting: Family Medicine

## 2024-06-17 ENCOUNTER — Encounter (INDEPENDENT_AMBULATORY_CARE_PROVIDER_SITE_OTHER): Payer: Self-pay | Admitting: Ophthalmology

## 2024-06-17 DIAGNOSIS — N489 Disorder of penis, unspecified: Secondary | ICD-10-CM

## 2024-06-17 DIAGNOSIS — E8770 Fluid overload, unspecified: Secondary | ICD-10-CM | POA: Diagnosis not present

## 2024-06-17 DIAGNOSIS — N186 End stage renal disease: Secondary | ICD-10-CM | POA: Diagnosis not present

## 2024-06-17 DIAGNOSIS — Z992 Dependence on renal dialysis: Secondary | ICD-10-CM | POA: Diagnosis not present

## 2024-06-17 DIAGNOSIS — N2581 Secondary hyperparathyroidism of renal origin: Secondary | ICD-10-CM | POA: Diagnosis not present

## 2024-06-17 MED ORDER — BEVACIZUMAB CHEMO INJECTION 1.25MG/0.05ML SYRINGE FOR KALEIDOSCOPE
1.2500 mg | INTRAVITREAL | Status: AC | PRN
  Administered 2024-06-17: 1.25 mg via INTRAVITREAL

## 2024-06-17 NOTE — Telephone Encounter (Signed)
 Patient seen 06/15/2024 for penile lesion and prescribed Azithromycin . Patient stated medication is not working and has consulted a 3rd party, which has suggested a change of medication.

## 2024-06-19 DIAGNOSIS — Z992 Dependence on renal dialysis: Secondary | ICD-10-CM | POA: Diagnosis not present

## 2024-06-19 DIAGNOSIS — N186 End stage renal disease: Secondary | ICD-10-CM | POA: Diagnosis not present

## 2024-06-19 DIAGNOSIS — N2581 Secondary hyperparathyroidism of renal origin: Secondary | ICD-10-CM | POA: Diagnosis not present

## 2024-06-22 ENCOUNTER — Ambulatory Visit: Payer: Self-pay

## 2024-06-22 DIAGNOSIS — N186 End stage renal disease: Secondary | ICD-10-CM | POA: Diagnosis not present

## 2024-06-22 DIAGNOSIS — Z992 Dependence on renal dialysis: Secondary | ICD-10-CM | POA: Diagnosis not present

## 2024-06-22 DIAGNOSIS — E8770 Fluid overload, unspecified: Secondary | ICD-10-CM | POA: Diagnosis not present

## 2024-06-22 NOTE — Telephone Encounter (Signed)
**Note Marcus-Identified via Obfuscation**  FYI Only or Action Required?: Action required by provider: request for appointment.  Patient was last seen in primary care on 06/15/2024 by Marcus Johnson, Marcus PARAS, MD.  Called Nurse Triage reporting Penis Pain.  Symptoms began several weeks ago.  Interventions attempted: Prescription medications: antibiotic.  Symptoms are: unchanged.Bump and pain to penis continue. States he thought he was getting a urology referral - sees Dr. Carolee. Please advise pt.  Triage Disposition: See PCP When Office is Open (Within 3 Days)  Patient/caregiver understands and will follow disposition?: No, wishes to speak with PCP     Copied from CRM #[id number]. Topic: Clinical - Red Word Triage >> Jun 22, 2024 12:54 PM Marcus Johnson wrote: Red Word that prompted transfer to Nurse Triage: patient is in pain and an antibiotic was given to him and it does not work. Reason for Disposition  All other penis - scrotum symptoms  (Exception: Painless rash < 24 hours duration.)  Answer Assessment - Initial Assessment Questions 1. SYMPTOM: What's the main symptom you're concerned about? (e.g., blood in semen, discharge or pus from penis, itching, pain, rash, swelling)     Bump on penis 2. LOCATION: Where is the  located?     penis 3. ONSET: When did   start?     2 weeks 4. PAIN: Is there any pain? If Yes, ask: How bad is it?  (Scale 1-10; or mild, moderate, severe)     8-9   difficulty passing urine? If Yes, ask: When was the last time?     no 6. CAUSE: What do you think is causing the symptoms?     unsure 7. OTHER SYMPTOMS: Do you have any other symptoms? (e.g., blood in urine, abdomen pain, fever)     no  Protocols used: Penis and Scrotum Symptoms-A-AH

## 2024-06-23 ENCOUNTER — Ambulatory Visit (HOSPITAL_COMMUNITY): Payer: Self-pay | Admitting: Pharmacist

## 2024-06-23 LAB — POCT INR: INR: 2.1 (ref 2.0–3.0)

## 2024-06-25 ENCOUNTER — Telehealth (HOSPITAL_COMMUNITY): Payer: Self-pay | Admitting: *Deleted

## 2024-06-25 DIAGNOSIS — N4821 Abscess of corpus cavernosum and penis: Secondary | ICD-10-CM

## 2024-06-25 DIAGNOSIS — Z95811 Presence of heart assist device: Secondary | ICD-10-CM

## 2024-06-25 MED ORDER — TRAMADOL HCL 50 MG PO TABS
50.0000 mg | ORAL_TABLET | Freq: Two times a day (BID) | ORAL | 0 refills | Status: AC | PRN
Start: 2024-06-25 — End: ?

## 2024-06-25 MED ORDER — DOXYCYCLINE HYCLATE 100 MG PO CAPS: 100.0000 mg | ORAL_CAPSULE | Freq: Two times a day (BID) | ORAL | 0 refills | Status: AC

## 2024-06-25 NOTE — Telephone Encounter (Signed)
 1823- Received page from patient reporting painful pearly red bump on penis. Pt was seen 11/11 by PCP for the same issue, and was prescribed a 1 time dose of Azithromycin . Pt called VAD coordinators on Monday 11/17 reporting antibiotic had not resolved bump. Advised at that time to reach out to PCP who prescribed antibiotic to discuss further treatment options. Pt reports tonight that he reached out to PCP office, but did not receive a call back regarding this matter. States he called urology office (Dr Carolee) and scheduled an appt 11/26. States bump is very painful- has been taking Tylenol  with no relief.   Discussed the above with Dr Rolan. Order received for Doxycyline 100 mg BID x 5 days (until he is seen by urology) and may take PRN Tramadol  50 mg BID for pain.   Spoke with pt. Made aware of antibiotic and pain medication prescriptions- verbalized understanding of both. Pt states his pharmacy is closing soon, and he will pick up in the morning. Offered to send prescriptions to 24 hour pharmacy- pt declined. Pt has existing prescription for Tramadol - but is currently out, and needs refill. Doxycycline  sent to pt's pharmacy. Called in PRN Tramadol  50 mg BID # of tablets: 30 Refills: 0 to patient's pharmacy.   Isaiah Knoll RN VAD Coordinator  Office: [phone]  24/7 Pager: [phone]

## 2024-06-25 NOTE — Telephone Encounter (Signed)
 Mr Mckibben need to reschedule his 2 week post op, patient can still be seen on 1/7 but need something after 12 due to dialysis   [phone]

## 2024-06-26 ENCOUNTER — Other Ambulatory Visit (HOSPITAL_COMMUNITY): Payer: Self-pay | Admitting: Cardiology

## 2024-06-26 DIAGNOSIS — Z95811 Presence of heart assist device: Secondary | ICD-10-CM

## 2024-06-26 DIAGNOSIS — N4821 Abscess of corpus cavernosum and penis: Secondary | ICD-10-CM

## 2024-06-28 DIAGNOSIS — E8770 Fluid overload, unspecified: Secondary | ICD-10-CM | POA: Diagnosis not present

## 2024-06-29 ENCOUNTER — Ambulatory Visit (HOSPITAL_COMMUNITY): Payer: Self-pay | Admitting: Pharmacist

## 2024-06-29 LAB — POCT INR: INR: 2 (ref 2.0–3.0)

## 2024-06-30 DIAGNOSIS — H25811 Combined forms of age-related cataract, right eye: Secondary | ICD-10-CM | POA: Diagnosis not present

## 2024-06-30 DIAGNOSIS — E8770 Fluid overload, unspecified: Secondary | ICD-10-CM | POA: Diagnosis not present

## 2024-06-30 DIAGNOSIS — Z7901 Long term (current) use of anticoagulants: Secondary | ICD-10-CM | POA: Diagnosis not present

## 2024-06-30 DIAGNOSIS — N186 End stage renal disease: Secondary | ICD-10-CM | POA: Diagnosis not present

## 2024-06-30 DIAGNOSIS — Z992 Dependence on renal dialysis: Secondary | ICD-10-CM | POA: Diagnosis not present

## 2024-06-30 DIAGNOSIS — Z01818 Encounter for other preprocedural examination: Secondary | ICD-10-CM | POA: Diagnosis not present

## 2024-07-01 ENCOUNTER — Telehealth (HOSPITAL_BASED_OUTPATIENT_CLINIC_OR_DEPARTMENT_OTHER): Payer: Self-pay | Admitting: *Deleted

## 2024-07-01 DIAGNOSIS — D290 Benign neoplasm of penis: Secondary | ICD-10-CM | POA: Diagnosis not present

## 2024-07-01 NOTE — Telephone Encounter (Signed)
 Patient needs an apt prior to device clearance d/t overdue.

## 2024-07-01 NOTE — Telephone Encounter (Signed)
   Patient Name: Marcus Johnson.  DOB: 1961/01/07 MRN: [id number]  Primary Cardiologist: None  Chart reviewed as part of pre-operative protocol coverage. Cataract extractions are recognized in guidelines as low risk surgeries that do not typically require specific preoperative testing or holding of blood thinner therapy. Therefore, given past medical history and time since last visit, based on ACC/AHA guidelines, Marcus Nicol. would be at acceptable risk for the planned procedure without further cardiovascular testing.   I will route this recommendation to the requesting party via Epic fax function and remove from pre-op pool.  Please call with questions.  Jon Garre Yaminah Clayborn, PA 07/01/2024, 9:15 AM

## 2024-07-01 NOTE — Telephone Encounter (Signed)
   Pre-operative Risk Assessment    Patient Name: Marcus Johnson.  DOB: Mar 13, 1961 MRN: [id number]   Date of last office visit: 06/04/2024 Date of next office visit: None  Request for Surgical Clearance    Procedure:  Cataract Surgery  Date of Surgery:  Clearance 07/23/24                                 Surgeon:  Zelda Lapping, PA-C Surgeon's Group or Practice Name:  [facility] Phone number:   Fax number:  [phone]   Type of Clearance Requested:   - Medical  - Pharmacy:  Hold Warfarin (Coumadin ) Not indicated   Type of Anesthesia:  MAC w/ IV versed  and numbing drops    Additional requests/questions:  Sent to device team.  Signed, Edsel Grayce Sanders   07/01/2024, 8:31 AM

## 2024-07-05 DIAGNOSIS — Z992 Dependence on renal dialysis: Secondary | ICD-10-CM | POA: Diagnosis not present

## 2024-07-05 DIAGNOSIS — N186 End stage renal disease: Secondary | ICD-10-CM | POA: Diagnosis not present

## 2024-07-05 DIAGNOSIS — I509 Heart failure, unspecified: Secondary | ICD-10-CM | POA: Diagnosis not present

## 2024-07-06 ENCOUNTER — Telehealth: Payer: Self-pay

## 2024-07-06 ENCOUNTER — Telehealth (HOSPITAL_COMMUNITY): Payer: Self-pay | Admitting: Unknown Physician Specialty

## 2024-07-06 NOTE — Telephone Encounter (Signed)
 Received voicemail from patient stating provider decreased his cephalexin  to once a day at his last visit and thinks it may need to be increased.   Called Marcus Johnson back.  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  He believes his nephrologist may prescribe something at his upcoming appointment on 12/3  His symptoms started about three weeks ago and he saw his PCP who gave him a one-time dose of azithromycin . He then received 5 days of doxycycline  from his cardiologist. He then saw his urologist who declined to prescribe further antibiotics as they told him the condition was chronic.   Will route to provider.   Safire Gordin, BSN, RN

## 2024-07-06 NOTE — Telephone Encounter (Signed)
 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 The charge nurse states that she reached out to the [facility] office to obtain the notes from Dr Carolee so that they can treat the pt appropriately at the dialysis center. The nurse was told she needed to contact medical records and the lady in MR is out until the end of the week. She reached out to VAD team for help as this diagnosis is very serious and she states they need to make urgent changes to his treatment plan. VAD coordinator reached out to DR bell via secure chat. Dr Carolee had his office send over his office note. This was faxed to the dialysis center.  1233: received page from pt stating that he thinks he may have tugged on his driveline a bit. He denies any redness or drainage. Pt has been on antibiotics recently for the calciphylaxis. Pt was informed that if he develops redness or drainage to please call the VAD office and we can schedule him an appt so that the VAD team can assess his driveline. Pt is agreeable to this plan.  Lauraine Ip RN, BSN VAD Coordinator 24/7 Pager [phone]

## 2024-07-06 NOTE — Telephone Encounter (Signed)
 Spoke with [name] after using 2 identifiers regarding his concerns about antibiotics and penile calciphylaxis. Discussed that this is not an infectious process and increasing antibiotics would not have any major benefit. 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 Continue to follow up with Nephrology and Urology. ID is available if needed.    Greg Geryl Dohn, NP 07/06/2024 2:44 PM

## 2024-07-07 ENCOUNTER — Ambulatory Visit (HOSPITAL_COMMUNITY): Payer: Self-pay | Admitting: Pharmacist

## 2024-07-07 ENCOUNTER — Ambulatory Visit: Payer: Medicare HMO

## 2024-07-07 DIAGNOSIS — I472 Ventricular tachycardia, unspecified: Secondary | ICD-10-CM

## 2024-07-07 LAB — POCT INR: INR: 1.9 — AB (ref 2.0–3.0)
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
## 2024-07-08 LAB — CUP PACEART REMOTE DEVICE CHECK
Battery Remaining Longevity: 98 mo
Battery Voltage: 3.01 V
Brady Statistic RV Percent Paced: 0.01 %
Date Time Interrogation Session: 20251202012203
HighPow Impedance: 40 Ohm
HighPow Impedance: 50 Ohm
Implantable Lead Connection Status: 753985
Implantable Lead Implant Date: 20130603
Implantable Lead Location: 753860
Implantable Lead Model: 6947
Implantable Pulse Generator Implant Date: 20211129
Lead Channel Impedance Value: 703 Ohm
Lead Channel Impedance Value: 988 Ohm
Lead Channel Pacing Threshold Amplitude: 0.625 V
Lead Channel Pacing Threshold Pulse Width: 0.4 ms
Lead Channel Sensing Intrinsic Amplitude: 5 mV
Lead Channel Sensing Intrinsic Amplitude: 5 mV
Lead Channel Setting Pacing Amplitude: 2 V
Lead Channel Setting Pacing Pulse Width: 0.4 ms
Lead Channel Setting Sensing Sensitivity: 0.3 mV
Zone Setting Status: 755011
Zone Setting Status: 755011

## 2024-07-08 NOTE — Telephone Encounter (Signed)
 Patient has to be seen within 1 year for clearance.  6months ago he would have still been in the 1 year window and appropriate for us  to clear but now is past due.  I am very sorry to hear he has received bad news.    We would still like to follow up with him at some point in the near future for routine care, but understand if he is needing time at present to process.    Can we put in a recall maybe in a month or  so, so we can keep him on our radar for follow up? Or is he going to call us  back at a later date?

## 2024-07-08 NOTE — Telephone Encounter (Signed)
 Spoke w/ patient. He stated that he will no longer be having his cataract surgery on 12/18, following [facility], as he states he learned some devastating news and they have urged him not to have the surgery. I expressed sympathy for what's going on. I did let him know, that when/if he is ready to get it rescheduled, that he call us  and schedule an appointment for his ICD as we haven't seen him since July 2024. He stated he just had his other cataract surgery 6 months ago, and didn't have to come in. Advised that may be because we never got a call for clearance (?, couldn't see one in his chart), he stated Dr. Rolan gave clearance.

## 2024-07-10 ENCOUNTER — Other Ambulatory Visit: Payer: Self-pay | Admitting: Family Medicine

## 2024-07-10 NOTE — Progress Notes (Signed)
 Remote ICD Transmission

## 2024-07-12 ENCOUNTER — Other Ambulatory Visit: Payer: Self-pay | Admitting: Family Medicine

## 2024-07-14 ENCOUNTER — Ambulatory Visit (HOSPITAL_COMMUNITY): Payer: Self-pay | Admitting: Pharmacist

## 2024-07-14 DIAGNOSIS — Z7901 Long term (current) use of anticoagulants: Secondary | ICD-10-CM | POA: Diagnosis not present

## 2024-07-14 LAB — POCT INR: INR: 2.4 (ref 2.0–3.0)

## 2024-07-15 ENCOUNTER — Ambulatory Visit: Payer: Self-pay | Admitting: Cardiology

## 2024-07-16 ENCOUNTER — Telehealth (HOSPITAL_COMMUNITY): Payer: Self-pay | Admitting: Unknown Physician Specialty

## 2024-07-16 DIAGNOSIS — R110 Nausea: Secondary | ICD-10-CM

## 2024-07-16 DIAGNOSIS — Z95811 Presence of heart assist device: Secondary | ICD-10-CM

## 2024-07-16 MED ORDER — ONDANSETRON HCL 4 MG PO TABS: 4.0000 mg | ORAL_TABLET | Freq: Three times a day (TID) | ORAL | 3 refills | Status: AC | PRN

## 2024-07-16 NOTE — Telephone Encounter (Signed)
 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 Zofran  sent to pts [facility] pharmacy.  Lauraine Ip RN, BSN VAD Coordinator 24/7 Pager [phone]

## 2024-07-20 ENCOUNTER — Telehealth (HOSPITAL_COMMUNITY): Payer: Self-pay | Admitting: *Deleted

## 2024-07-20 LAB — COLOGUARD: COLOGUARD: NEGATIVE

## 2024-07-20 NOTE — Telephone Encounter (Signed)
 Received request from Dr Bernardino Gasman for catheter exchange due to L internal jugular catheter limb hub part venous has cracks and loose.  Will notify Alan and send fax to media.

## 2024-07-21 ENCOUNTER — Ambulatory Visit (HOSPITAL_COMMUNITY): Payer: Self-pay | Admitting: Pharmacist

## 2024-07-21 ENCOUNTER — Telehealth: Payer: Self-pay

## 2024-07-21 ENCOUNTER — Encounter (INDEPENDENT_AMBULATORY_CARE_PROVIDER_SITE_OTHER): Admitting: Ophthalmology

## 2024-07-21 ENCOUNTER — Other Ambulatory Visit: Payer: Self-pay

## 2024-07-21 DIAGNOSIS — E113513 Type 2 diabetes mellitus with proliferative diabetic retinopathy with macular edema, bilateral: Secondary | ICD-10-CM

## 2024-07-21 DIAGNOSIS — Z961 Presence of intraocular lens: Secondary | ICD-10-CM

## 2024-07-21 DIAGNOSIS — I10 Essential (primary) hypertension: Secondary | ICD-10-CM

## 2024-07-21 DIAGNOSIS — H35033 Hypertensive retinopathy, bilateral: Secondary | ICD-10-CM

## 2024-07-21 DIAGNOSIS — H4312 Vitreous hemorrhage, left eye: Secondary | ICD-10-CM

## 2024-07-21 DIAGNOSIS — N186 End stage renal disease: Secondary | ICD-10-CM

## 2024-07-21 DIAGNOSIS — Z794 Long term (current) use of insulin: Secondary | ICD-10-CM

## 2024-07-21 DIAGNOSIS — H25811 Combined forms of age-related cataract, right eye: Secondary | ICD-10-CM

## 2024-07-21 LAB — POCT INR: INR: 1.8 — AB (ref 2.0–3.0)

## 2024-07-21 NOTE — Telephone Encounter (Signed)
 Attempted to call for surgery scheduling. LVM

## 2024-07-23 ENCOUNTER — Other Ambulatory Visit: Payer: Self-pay

## 2024-07-23 ENCOUNTER — Encounter (HOSPITAL_COMMUNITY)
Admission: RE | Disposition: A | Discharge status: Home / Self Care | Payer: Self-pay | Source: Home / Self Care | Attending: Vascular Surgery

## 2024-07-23 ENCOUNTER — Encounter (HOSPITAL_COMMUNITY): Payer: Self-pay | Admitting: Vascular Surgery

## 2024-07-23 ENCOUNTER — Ambulatory Visit (HOSPITAL_COMMUNITY)
Admission: RE | Admit: 2024-07-23 | Discharge: 2024-07-23 | Disposition: A | Discharge status: Home / Self Care | Attending: Vascular Surgery | Admitting: Vascular Surgery

## 2024-07-23 DIAGNOSIS — I132 Hypertensive heart and chronic kidney disease with heart failure and with stage 5 chronic kidney disease, or end stage renal disease: Secondary | ICD-10-CM | POA: Diagnosis not present

## 2024-07-23 DIAGNOSIS — T82510A Breakdown (mechanical) of surgically created arteriovenous fistula, initial encounter: Secondary | ICD-10-CM | POA: Diagnosis present

## 2024-07-23 DIAGNOSIS — I5022 Chronic systolic (congestive) heart failure: Secondary | ICD-10-CM | POA: Diagnosis not present

## 2024-07-23 DIAGNOSIS — Z7901 Long term (current) use of anticoagulants: Secondary | ICD-10-CM | POA: Insufficient documentation

## 2024-07-23 DIAGNOSIS — Z992 Dependence on renal dialysis: Secondary | ICD-10-CM | POA: Diagnosis not present

## 2024-07-23 DIAGNOSIS — Z794 Long term (current) use of insulin: Secondary | ICD-10-CM | POA: Diagnosis not present

## 2024-07-23 DIAGNOSIS — Z79899 Other long term (current) drug therapy: Secondary | ICD-10-CM | POA: Insufficient documentation

## 2024-07-23 DIAGNOSIS — T8249XA Other complication of vascular dialysis catheter, initial encounter: Secondary | ICD-10-CM

## 2024-07-23 DIAGNOSIS — Y839 Surgical procedure, unspecified as the cause of abnormal reaction of the patient, or of later complication, without mention of misadventure at the time of the procedure: Secondary | ICD-10-CM | POA: Diagnosis not present

## 2024-07-23 DIAGNOSIS — E1122 Type 2 diabetes mellitus with diabetic chronic kidney disease: Secondary | ICD-10-CM | POA: Diagnosis not present

## 2024-07-23 DIAGNOSIS — N186 End stage renal disease: Secondary | ICD-10-CM | POA: Insufficient documentation

## 2024-07-23 HISTORY — PX: TUNNELLED CATHETER EXCHANGE: CATH118373

## 2024-07-23 LAB — POCT I-STAT, CHEM 8
BUN: 36 mg/dL — ABNORMAL HIGH (ref 8–23)
Calcium, Ion: 1.12 mmol/L — ABNORMAL LOW (ref 1.15–1.40)
Chloride: 95 mmol/L — ABNORMAL LOW (ref 98–111)
Creatinine, Ser: 5.9 mg/dL — ABNORMAL HIGH (ref 0.61–1.24)
Glucose, Bld: 128 mg/dL — ABNORMAL HIGH (ref 70–99)
HCT: 39 % (ref 39.0–52.0)
Hemoglobin: 13.3 g/dL (ref 13.0–17.0)
Potassium: 4 mmol/L (ref 3.5–5.1)
Sodium: 139 mmol/L (ref 135–145)
TCO2: 31 mmol/L (ref 22–32)

## 2024-07-23 LAB — GLUCOSE, CAPILLARY: Glucose-Capillary: 137 mg/dL — ABNORMAL HIGH (ref 70–99)

## 2024-07-23 LAB — PROTIME-INR
INR: 1.4 — ABNORMAL HIGH (ref 0.8–1.2)
Prothrombin Time: 17.7 s — ABNORMAL HIGH (ref 11.4–15.2)

## 2024-07-23 SURGERY — TUNNELLED CATHETER EXCHANGE

## 2024-07-23 MED ORDER — LIDOCAINE HCL (PF) 1 % IJ SOLN
INTRAMUSCULAR | Status: AC
  Filled 2024-07-23: qty 30

## 2024-07-23 MED ORDER — LIDOCAINE HCL (PF) 1 % IJ SOLN
INTRAMUSCULAR | Status: DC | PRN
  Administered 2024-07-23: 08:00:00 30 mL

## 2024-07-23 MED ORDER — LIDOCAINE-EPINEPHRINE 1 %-1:100000 IJ SOLN
INTRAMUSCULAR | Status: DC | PRN
  Administered 2024-07-23: 08:00:00 20 mL

## 2024-07-23 MED ORDER — HEPARIN SODIUM (PORCINE) 1000 UNIT/ML IJ SOLN
INTRAMUSCULAR | Status: DC | PRN
  Administered 2024-07-23 (×2): 1900 [IU] via INTRAVENOUS

## 2024-07-23 MED ORDER — FENTANYL CITRATE (PF) 100 MCG/2ML IJ SOLN
INTRAMUSCULAR | Status: AC
  Filled 2024-07-23: qty 2

## 2024-07-23 MED ORDER — MIDAZOLAM HCL (PF) 2 MG/2ML IJ SOLN
INTRAMUSCULAR | Status: DC | PRN
  Administered 2024-07-23: 08:00:00 1 mg via INTRAVENOUS

## 2024-07-23 MED ORDER — LIDOCAINE-EPINEPHRINE 1 %-1:100000 IJ SOLN
INTRAMUSCULAR | Status: AC
  Filled 2024-07-23: qty 1

## 2024-07-23 MED ORDER — HEPARIN SODIUM (PORCINE) 1000 UNIT/ML IJ SOLN
INTRAMUSCULAR | Status: AC
  Filled 2024-07-23: qty 10

## 2024-07-23 MED ORDER — MIDAZOLAM HCL 2 MG/2ML IJ SOLN
INTRAMUSCULAR | Status: AC
  Filled 2024-07-23: qty 2

## 2024-07-23 MED ORDER — FENTANYL CITRATE (PF) 100 MCG/2ML IJ SOLN
INTRAMUSCULAR | Status: DC | PRN
  Administered 2024-07-23: 08:00:00 25 ug via INTRAVENOUS

## 2024-07-23 MED ORDER — SODIUM CHLORIDE 0.9% FLUSH: 3.0000 mL | INTRAVENOUS | Status: DC | PRN

## 2024-07-23 SURGICAL SUPPLY — 5 items
CATH PALINDROME RT-P 15FX28CM (CATHETERS) IMPLANT
CATH PALINDROME-P 23 W/VT (CATHETERS) IMPLANT
GLIDEWIRE STIFF .35X180X3 HYDR (WIRE) IMPLANT
KIT MICROPUNCTURE NIT STIFF (SHEATH) IMPLANT
PACK CARDIAC CATHETERIZATION (CUSTOM PROCEDURE TRAY) IMPLANT
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
## 2024-07-23 NOTE — Progress Notes (Signed)
 Patient and patient brother given discharge instructions, education provided no further questions at this time. Able to tolerate PO intake. Patient site is clean, dry, intact with no hematoma noted upon discharge. Verified with MD Gretta when  to resume Warfarin.

## 2024-07-23 NOTE — Progress Notes (Signed)
 LVAD coordinator called to make aware pt is back in room in case they need to be seen. MD clark paged to verify discharge time.

## 2024-07-23 NOTE — Op Note (Signed)
° ° °  Patient name: Marcus Johnson. MRN: [id number] DOB: 11-Apr-1961 Sex: male  07/23/2024 Pre-operative Diagnosis: End-stage renal disease with malfunctioning right IJ [facility] Post-operative diagnosis:  Same Surgeon:  Lonni DOROTHA Gaskins, MD Procedure Performed: 1.  Exchange of right internal jugular vein tunneled dialysis catheter for a new 23 cm palindrome catheter under fluoroscopic guidance  2.  23 minutes of monitored moderate conscious sedation time  Indications: 63 year old male with end-stage renal disease along with many other comorbidities that presents for tunneled dialysis catheter exchange.  He has a right IJ TDC that has been in place for 3-1/2 years that is now nonfunctioning.  Risks benefits discussed with the patient.  Findings:   The cuff was quite difficult to dissected free but ultimately we were able to exchange for a new 23 cm palindrome catheter over a wire under fluoroscopic guidance with the tip in the right atrium and this flushed and aspirated easily.   Procedure:  The patient was identified in the holding area and taken to room 8.  The patient was then placed supine on the table and prepped and draped in the usual sterile fashion.  A time out was called.  The patient received Versed  and fentanyl  for conscious moderate sedation.  Vital signs are monitored including heart rate, respiratory rate, oxygenation and blood pressure.  I was present for all moderate sedation.  Initially injected about 30 mL of 1% lidocaine  without epinephrine  around the existing catheter and cuff.  I then used Metzenbaum scissors and a hemostat to bluntly dissect the cuff free.  This was very scarred and given the catheter been in place for 3-1/2 years.  Finally I was able to get this free and I placed a stiff Glidewire through the existing catheter under fluoroscopic guidance into the IVC.  The old catheter was removed in its entirety.  We placed a new 23 cm catheter over the wire with the tip  in the right atrium and the wire was removed.  This flushed and aspirated easily.  It was loaded with heparin  according manufactures recommendations.  I used 2-0 nylons around the catheter exit site and we held pressure until we had good hemostasis and Dermabond was placed around the catheter.   Lonni DOROTHA Gaskins, MD [facility] [place] Office: [phone]

## 2024-07-23 NOTE — Progress Notes (Signed)
 VAD Coordinator Procedure Note:   VAD Coordinator met patient in cath lab. Pt undergoing tunneled HD cath exchange per Dr. Gretta. Hemodynamics and VAD parameters monitored by myself and cath lab staff throughout the procedure. Blood pressures were obtained with automatic cuff on left arm.   Time: Auto  BP Flow PI Power Speed  Pre-procedure:  0600 117/94(102) 5.1 3.3 4.5w 5700                  Sedation Induction: 0752 145/86(105) 5.3 3.4 4.6w 5700   0800 137/96(110) 5.2 4.1 4.3w 5700                  Recovery Area: 0824 146/104(118) 5.3 3.3 4.5w 5700           Conscious sedation given pre-procedure.Patient tolerated the procedure well. VAD Coordinator accompanied and remained with patient in recovery area. Pt able to manage his equipment post-procedure. Silver (caregiver) at bedside.  Patient Disposition: Short Stay P19   Marcus Lunger RN, BSN VAD Coordinator 24/7 Pager [phone]

## 2024-07-27 ENCOUNTER — Ambulatory Visit (HOSPITAL_COMMUNITY): Payer: Self-pay | Admitting: Pharmacist

## 2024-07-27 ENCOUNTER — Ambulatory Visit: Payer: Self-pay | Admitting: Pharmacist

## 2024-07-27 LAB — POCT INR: INR: 1.8 — AB (ref 2.0–3.0)

## 2024-08-03 ENCOUNTER — Ambulatory Visit: Payer: Medicare HMO

## 2024-08-03 ENCOUNTER — Ambulatory Visit (HOSPITAL_COMMUNITY): Payer: Self-pay | Admitting: Pharmacist

## 2024-08-03 VITALS — Ht 70.5 in | Wt 183.0 lb

## 2024-08-03 DIAGNOSIS — Z0000 Encounter for general adult medical examination without abnormal findings: Secondary | ICD-10-CM | POA: Diagnosis not present

## 2024-08-03 LAB — POCT INR: INR: 1.9 — AB (ref 2.0–3.0)

## 2024-08-03 NOTE — Patient Instructions (Addendum)
 [name],  Thank you for taking the time for your Medicare Wellness Visit. I appreciate your continued commitment to your health goals. Please review the care plan we discussed, and feel free to reach out if I can assist you further.  Please note that Annual Wellness Visits do not include a physical exam. Some assessments may be limited, especially if the visit was conducted virtually. If needed, we may recommend an in-person follow-up with your provider.  Ongoing Care Seeing your primary care provider every 3 to 6 months helps us  monitor your health and provide consistent, personalized care.    Referrals If a referral was made during today's visit and you haven't received any updates within two weeks, please contact the referred provider directly to check on the status.  Recommended Screenings:  Health Maintenance  Topic Date Due   Zoster (Shingles) Vaccine (2 of 2) 07/16/2024   COVID-19 Vaccine (5 - Pfizer risk 2025-26 season) 10/26/2024   Hemoglobin A1C  11/24/2024   Complete foot exam   05/07/2025   Eye exam for diabetics  05/12/2025   Medicare Annual Wellness Visit  08/03/2025   Cologuard (Stool DNA test)  07/15/2027   DTaP/Tdap/Td vaccine (2 - Td or Tdap) 06/04/2034   Pneumococcal Vaccine for age over 33  Completed   Flu Shot  Completed   Hepatitis B Vaccine  Completed   Hepatitis C Screening  Completed   HIV Screening  Completed   HPV Vaccine  Aged Out   Meningitis B Vaccine  Aged Out   Opioid Pain Medicine Management Opioids are powerful medicines that are used to treat moderate to severe pain. When used for short periods of time, they can help you to: Sleep better. Do better in physical or occupational therapy. Feel better in the first few days after an injury. Recover from surgery. Opioids should be taken with the supervision of a trained health care provider. They should be taken for the shortest period of time possible. This is because opioids can be addictive, and  the longer you take opioids, the greater your risk of addiction. This addiction can also be called opioid use disorder. What are the risks? Using opioid pain medicines for longer than 3 days increases your risk of side effects. Side effects include: Constipation. Nausea and vomiting. Breathing difficulties (respiratory depression). Drowsiness. Confusion. Opioid use disorder. Itching. Taking opioid pain medicine for a long period of time can affect your ability to do daily tasks. It also puts you at risk for: Motor vehicle crashes. Depression. Suicide. Heart attack. Overdose, which can be life-threatening. What is a pain treatment plan? A pain treatment plan is an agreement between you and your health care provider. Pain is unique to each person, and treatments vary depending on your condition. To manage your pain, you and your health care provider need to work together. To help you do this: Discuss the goals of your treatment, including how much pain you might expect to have and how you will manage the pain. Review the risks and benefits of taking opioid medicines. Remember that a good treatment plan uses more than one approach and minimizes the chance of side effects. Be honest about the amount of medicines you take and about any drug or alcohol use. Get pain medicine prescriptions from only one health care provider. Pain can be managed with many types of alternative treatments. Ask your health care provider to refer you to one or more specialists who can help you manage pain through: Physical or  occupational therapy. Counseling (cognitive behavioral therapy). Good nutrition. Biofeedback. Massage. Meditation. Non-opioid medicine. Following a gentle exercise program. How to use opioid pain medicine Taking medicine Take your pain medicine exactly as told by your health care provider. Take it only when you need it. If your pain gets less severe, you may take less than your prescribed  dose if your health care provider approves. If you are not having pain, do nottake pain medicine unless your health care provider tells you to take it. If your pain is severe, do nottry to treat it yourself by taking more pills than instructed on your prescription. Contact your health care provider for help. Write down the times when you take your pain medicine. It is easy to become confused while on pain medicine. Writing the time can help you avoid overdose. Take other over-the-counter or prescription medicines only as told by your health care provider. Keeping yourself and others safe  While you are taking opioid pain medicine: Do not drive, use machinery, or power tools. Do not sign legal documents. Do not drink alcohol. Do not take sleeping pills. Do not supervise children by yourself. Do not do activities that require climbing or being in high places. Do not go to a lake, river, ocean, spa, or swimming pool. Do not share your pain medicine with anyone. Keep pain medicine in a locked cabinet or in a secure area where pets and children cannot reach it. Stopping your use of opioids If you have been taking opioid medicine for more than a few weeks, you may need to slowly decrease (taper) how much you take until you stop completely. Tapering your use of opioids can decrease your risk of symptoms of withdrawal, such as: Pain and cramping in the abdomen. Nausea. Sweating. Sleepiness. Restlessness. Uncontrollable shaking (tremors). Cravings for the medicine. Do not attempt to taper your use of opioids on your own. Talk with your health care provider about how to do this. Your health care provider may prescribe a step-down schedule based on how much medicine you are taking and how long you have been taking it. Getting rid of leftover pills Do not save any leftover pills. Get rid of leftover pills safely by: Taking the medicine to a prescription take-back program. This is usually offered by  the county or law enforcement. Bringing them to a pharmacy that has a drug disposal container. Flushing them down the toilet. Check the label or package insert of your medicine to see whether this is safe to do. Throwing them out in the trash. Check the label or package insert of your medicine to see whether this is safe to do. If it is safe to throw it out, remove the medicine from the original container, put it into a sealable bag or container, and mix it with used coffee grounds, food scraps, dirt, or cat litter before putting it in the trash. Follow these instructions at home: Activity Do exercises as told by your health care provider. Avoid activities that make your pain worse. Return to your normal activities as told by your health care provider. Ask your health care provider what activities are safe for you. General instructions You may need to take these actions to prevent or treat constipation: Drink enough fluid to keep your urine pale yellow. Take over-the-counter or prescription medicines. Eat foods that are high in fiber, such as beans, whole grains, and fresh fruits and vegetables. Limit foods that are high in fat and processed sugars, such as fried or sweet  foods. Keep all follow-up visits. This is important. Where to find support If you have been taking opioids for a long time, you may benefit from receiving support for quitting from a local support group or counselor. Ask your health care provider for a referral to these resources in your area. Where to find more information Centers for Disease Control and Prevention (CDC): footballexhibition.com.br [place] Food and Drug Administration (FDA): pumpkinsearch.com.ee Get help right away if: You may have taken too much of an opioid (overdosed). Common symptoms of an overdose: Your breathing is slower or more shallow than normal. You have a very slow heartbeat (pulse). You have slurred speech. You have nausea and vomiting. Your pupils become very  small. You have other potential symptoms: You are very confused. You faint or feel like you will faint. You have cold, clammy skin. You have blue lips or fingernails. You have thoughts of harming yourself or harming others. These symptoms may represent a serious problem that is an emergency. Do not wait to see if the symptoms will go away. Get medical help right away. Call your local emergency services (911 in the [place] Do not drive yourself to the hospital.  If you ever feel like you may hurt yourself or others, or have thoughts about taking your own life, get help right away. Go to your nearest emergency department or: Call your local emergency services (911 in the [place] Call the [facility] (772-667-2635 in the [place] Call a suicide crisis helpline, such as the National Suicide Prevention Lifeline at 782-854-1120 or 988 in the [place] This is open 24 hours a day in the [place] If youre a Veteran: Call 988 and press 1. This is open 24 hours a day. Text the [employer] at (970)624-9419. Summary Opioid medicines can help you manage moderate to severe pain for a short period of time. A pain treatment plan is an agreement between you and your health care provider. Discuss the goals of your treatment, including how much pain you might expect to have and how you will manage the pain. If you think that you or someone else may have taken too much of an opioid, get medical help right away. This information is not intended to replace advice given to you by your health care provider. Make sure you discuss any questions you have with your health care provider. Document Revised: 04/29/2023 Document Reviewed: 11/02/2020 [employer] Patient Education  2024 [employer]    08/03/2024    9:31 AM  Advanced Directives  Does Patient Have a Medical Advance Directive? Yes  Type of Estate Agent of [place];Living will  Does patient want to make changes to medical  advance directive? No - Patient declined  Copy of Healthcare Power of Attorney in Chart? Yes - validated most recent copy scanned in chart (See row information)  Would patient like information on creating a medical advance directive? No - Patient declined    Vision: Annual vision screenings are recommended for early detection of glaucoma, cataracts, and diabetic retinopathy. These exams can also reveal signs of chronic conditions such as diabetes and high blood pressure.  Dental: Annual dental screenings help detect early signs of oral cancer, gum disease, and other conditions linked to overall health, including heart disease and diabetes.  Please see the attached documents for additional preventive care recommendations.
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
## 2024-08-11 ENCOUNTER — Ambulatory Visit (HOSPITAL_COMMUNITY): Payer: Self-pay | Admitting: Pharmacist

## 2024-08-11 ENCOUNTER — Other Ambulatory Visit (HOSPITAL_COMMUNITY): Payer: Self-pay | Admitting: Cardiology

## 2024-08-11 ENCOUNTER — Ambulatory Visit (HOSPITAL_COMMUNITY): Admitting: Cardiology

## 2024-08-11 DIAGNOSIS — N4821 Abscess of corpus cavernosum and penis: Secondary | ICD-10-CM

## 2024-08-11 DIAGNOSIS — I951 Orthostatic hypotension: Secondary | ICD-10-CM

## 2024-08-11 DIAGNOSIS — K3184 Gastroparesis: Secondary | ICD-10-CM

## 2024-08-11 DIAGNOSIS — Z95811 Presence of heart assist device: Secondary | ICD-10-CM

## 2024-08-11 DIAGNOSIS — Z7901 Long term (current) use of anticoagulants: Secondary | ICD-10-CM

## 2024-08-11 LAB — POCT INR: INR: 1.9 — AB (ref 2.0–3.0)

## 2024-08-12 ENCOUNTER — Telehealth (HOSPITAL_COMMUNITY): Payer: Self-pay

## 2024-08-12 ENCOUNTER — Other Ambulatory Visit (HOSPITAL_COMMUNITY): Payer: Self-pay

## 2024-08-12 DIAGNOSIS — K3184 Gastroparesis: Secondary | ICD-10-CM

## 2024-08-12 DIAGNOSIS — Z7901 Long term (current) use of anticoagulants: Secondary | ICD-10-CM

## 2024-08-12 DIAGNOSIS — Z95811 Presence of heart assist device: Secondary | ICD-10-CM

## 2024-08-12 DIAGNOSIS — I951 Orthostatic hypotension: Secondary | ICD-10-CM

## 2024-08-12 MED ORDER — WARFARIN SODIUM 3 MG PO TABS: ORAL_TABLET | ORAL | 3 refills | Status: AC

## 2024-08-13 ENCOUNTER — Encounter (HOSPITAL_BASED_OUTPATIENT_CLINIC_OR_DEPARTMENT_OTHER): Attending: Internal Medicine | Admitting: Internal Medicine

## 2024-08-13 ENCOUNTER — Ambulatory Visit: Admitting: Podiatry

## 2024-08-13 DIAGNOSIS — A4901 Methicillin susceptible Staphylococcus aureus infection, unspecified site: Secondary | ICD-10-CM | POA: Diagnosis not present

## 2024-08-13 DIAGNOSIS — I132 Hypertensive heart and chronic kidney disease with heart failure and with stage 5 chronic kidney disease, or end stage renal disease: Secondary | ICD-10-CM | POA: Diagnosis not present

## 2024-08-13 DIAGNOSIS — E1140 Type 2 diabetes mellitus with diabetic neuropathy, unspecified: Secondary | ICD-10-CM | POA: Diagnosis not present

## 2024-08-13 DIAGNOSIS — Z7901 Long term (current) use of anticoagulants: Secondary | ICD-10-CM | POA: Diagnosis not present

## 2024-08-13 DIAGNOSIS — Z794 Long term (current) use of insulin: Secondary | ICD-10-CM

## 2024-08-13 DIAGNOSIS — Z950 Presence of cardiac pacemaker: Secondary | ICD-10-CM | POA: Diagnosis not present

## 2024-08-13 DIAGNOSIS — B351 Tinea unguium: Secondary | ICD-10-CM

## 2024-08-13 DIAGNOSIS — Z95811 Presence of heart assist device: Secondary | ICD-10-CM | POA: Insufficient documentation

## 2024-08-13 DIAGNOSIS — I509 Heart failure, unspecified: Secondary | ICD-10-CM | POA: Insufficient documentation

## 2024-08-13 DIAGNOSIS — E1122 Type 2 diabetes mellitus with diabetic chronic kidney disease: Secondary | ICD-10-CM

## 2024-08-13 DIAGNOSIS — N186 End stage renal disease: Secondary | ICD-10-CM | POA: Diagnosis not present

## 2024-08-13 DIAGNOSIS — Z992 Dependence on renal dialysis: Secondary | ICD-10-CM

## 2024-08-13 DIAGNOSIS — Z792 Long term (current) use of antibiotics: Secondary | ICD-10-CM | POA: Insufficient documentation

## 2024-08-13 DIAGNOSIS — N485 Ulcer of penis: Secondary | ICD-10-CM | POA: Insufficient documentation

## 2024-08-13 DIAGNOSIS — M79675 Pain in left toe(s): Secondary | ICD-10-CM

## 2024-08-13 DIAGNOSIS — Q828 Other specified congenital malformations of skin: Secondary | ICD-10-CM | POA: Diagnosis not present

## 2024-08-13 DIAGNOSIS — M79674 Pain in right toe(s): Secondary | ICD-10-CM | POA: Diagnosis not present

## 2024-08-13 NOTE — Progress Notes (Signed)
 This patient presents  to my office for at risk foot care.  This patient requires this care by a professional since this patient will be at risk due to having  CKD, coagulation defect and DM.  This patient is unable to cut nails himself since the patient cannot reach his nails.These nails are painful walking and wearing shoes. SABRA  He presents to the office with his brother.  This patient presents for at risk foot care today.  General Appearance  Alert, conversant and in no acute stress.  Vascular  Dorsalis pedis and posterior tibial  pulses are absent  bilaterally.  Capillary return is within normal limits  bilaterally. Temperature is within normal limits  bilaterally.  Neurologic  Senn-Weinstein monofilament wire test absent  bilaterally. Muscle power within normal limits bilaterally.  Nails Thick disfigured discolored nails with subungual debris  from hallux to fifth toes bilaterally. No evidence of bacterial infection or drainage bilaterally.  Orthopedic  No limitations of motion  feet .  No crepitus or effusions noted.  No bony pathology or digital deformities noted.  No  STJ ROM left foot.  Skin  normotropic skin with no porokeratosis noted bilaterally.  No signs of infections or ulcers noted.   Callus lateral aspect fifth metabase left foot symptomatic.  Callus plantar aspect right foot.  Onychomycosis  Pain in right toes  Pain in left toes  Debride callus with dremel tool.  Consent was obtained for treatment procedures.   Mechanical debridement of nails 1-5  bilaterally performed with a nail nipper.  Filed with dremel without incident.   Jen porokeratosis with dremel tool.  Cordella Bold DPM      Cordella Bold DPM

## 2024-08-18 ENCOUNTER — Ambulatory Visit (HOSPITAL_COMMUNITY): Payer: Self-pay | Admitting: Pharmacist

## 2024-08-18 LAB — POCT INR: INR: 2.3 (ref 2.0–3.0)

## 2024-08-24 ENCOUNTER — Other Ambulatory Visit (HOSPITAL_COMMUNITY): Payer: Self-pay

## 2024-08-24 ENCOUNTER — Other Ambulatory Visit (HOSPITAL_COMMUNITY): Payer: Self-pay | Admitting: Unknown Physician Specialty

## 2024-08-24 DIAGNOSIS — Z95811 Presence of heart assist device: Secondary | ICD-10-CM

## 2024-08-24 DIAGNOSIS — Z7901 Long term (current) use of anticoagulants: Secondary | ICD-10-CM

## 2024-08-24 NOTE — Telephone Encounter (Signed)
 Advanced Heart Failure Patient Advocate Encounter  Test billing for Mexiletine shows refill too soon rejection, does not indicate prior auth needed at this time.   Rachel DEL, CPhT Rx Patient Advocate Phone: [phone]

## 2024-08-25 ENCOUNTER — Ambulatory Visit (HOSPITAL_COMMUNITY): Payer: Self-pay | Admitting: Pharmacist

## 2024-08-25 ENCOUNTER — Ambulatory Visit (HOSPITAL_COMMUNITY): Payer: Self-pay | Admitting: Cardiology

## 2024-08-25 ENCOUNTER — Ambulatory Visit (HOSPITAL_COMMUNITY)
Admission: RE | Admit: 2024-08-25 | Discharge: 2024-08-25 | Disposition: A | Discharge status: Home / Self Care | Source: Ambulatory Visit | Attending: Cardiology | Admitting: Cardiology

## 2024-08-25 ENCOUNTER — Other Ambulatory Visit (HOSPITAL_COMMUNITY): Payer: Self-pay | Admitting: Cardiology

## 2024-08-25 DIAGNOSIS — N186 End stage renal disease: Secondary | ICD-10-CM | POA: Insufficient documentation

## 2024-08-25 DIAGNOSIS — I2510 Atherosclerotic heart disease of native coronary artery without angina pectoris: Secondary | ICD-10-CM | POA: Insufficient documentation

## 2024-08-25 DIAGNOSIS — N485 Ulcer of penis: Secondary | ICD-10-CM | POA: Diagnosis not present

## 2024-08-25 DIAGNOSIS — I4891 Unspecified atrial fibrillation: Secondary | ICD-10-CM | POA: Diagnosis not present

## 2024-08-25 DIAGNOSIS — E1122 Type 2 diabetes mellitus with diabetic chronic kidney disease: Secondary | ICD-10-CM | POA: Insufficient documentation

## 2024-08-25 DIAGNOSIS — D649 Anemia, unspecified: Secondary | ICD-10-CM | POA: Diagnosis not present

## 2024-08-25 DIAGNOSIS — Z8616 Personal history of COVID-19: Secondary | ICD-10-CM | POA: Insufficient documentation

## 2024-08-25 DIAGNOSIS — I132 Hypertensive heart and chronic kidney disease with heart failure and with stage 5 chronic kidney disease, or end stage renal disease: Secondary | ICD-10-CM | POA: Insufficient documentation

## 2024-08-25 DIAGNOSIS — Z95811 Presence of heart assist device: Secondary | ICD-10-CM | POA: Diagnosis not present

## 2024-08-25 DIAGNOSIS — Z951 Presence of aortocoronary bypass graft: Secondary | ICD-10-CM | POA: Insufficient documentation

## 2024-08-25 DIAGNOSIS — Z992 Dependence on renal dialysis: Secondary | ICD-10-CM | POA: Diagnosis not present

## 2024-08-25 DIAGNOSIS — I255 Ischemic cardiomyopathy: Secondary | ICD-10-CM | POA: Diagnosis not present

## 2024-08-25 DIAGNOSIS — Z95810 Presence of automatic (implantable) cardiac defibrillator: Secondary | ICD-10-CM | POA: Insufficient documentation

## 2024-08-25 DIAGNOSIS — Z79899 Other long term (current) drug therapy: Secondary | ICD-10-CM | POA: Insufficient documentation

## 2024-08-25 DIAGNOSIS — Z7901 Long term (current) use of anticoagulants: Secondary | ICD-10-CM | POA: Diagnosis not present

## 2024-08-25 DIAGNOSIS — M79641 Pain in right hand: Secondary | ICD-10-CM | POA: Insufficient documentation

## 2024-08-25 DIAGNOSIS — Z8614 Personal history of Methicillin resistant Staphylococcus aureus infection: Secondary | ICD-10-CM | POA: Diagnosis not present

## 2024-08-25 DIAGNOSIS — I4892 Unspecified atrial flutter: Secondary | ICD-10-CM | POA: Diagnosis not present

## 2024-08-25 DIAGNOSIS — Z794 Long term (current) use of insulin: Secondary | ICD-10-CM | POA: Insufficient documentation

## 2024-08-25 DIAGNOSIS — D696 Thrombocytopenia, unspecified: Secondary | ICD-10-CM | POA: Insufficient documentation

## 2024-08-25 DIAGNOSIS — I5022 Chronic systolic (congestive) heart failure: Secondary | ICD-10-CM | POA: Insufficient documentation

## 2024-08-25 LAB — BASIC METABOLIC PANEL WITH GFR
Anion gap: 15 (ref 5–15)
BUN: 24 mg/dL — ABNORMAL HIGH (ref 8–23)
CO2: 31 mmol/L (ref 22–32)
Calcium: 10 mg/dL (ref 8.9–10.3)
Chloride: 94 mmol/L — ABNORMAL LOW (ref 98–111)
Creatinine, Ser: 5.66 mg/dL — ABNORMAL HIGH (ref 0.61–1.24)
GFR, Estimated: 11 mL/min — ABNORMAL LOW
Glucose, Bld: 103 mg/dL — ABNORMAL HIGH (ref 70–99)
Potassium: 3.9 mmol/L (ref 3.5–5.1)
Sodium: 140 mmol/L (ref 135–145)

## 2024-08-25 LAB — PROTIME-INR
INR: 1.9 — ABNORMAL HIGH (ref 0.8–1.2)
Prothrombin Time: 22.4 s — ABNORMAL HIGH (ref 11.4–15.2)

## 2024-08-25 LAB — CBC
HCT: 35.2 % — ABNORMAL LOW (ref 39.0–52.0)
Hemoglobin: 11.2 g/dL — ABNORMAL LOW (ref 13.0–17.0)
MCH: 30.7 pg (ref 26.0–34.0)
MCHC: 31.8 g/dL (ref 30.0–36.0)
MCV: 96.4 fL (ref 80.0–100.0)
Platelets: 155 K/uL (ref 150–400)
RBC: 3.65 MIL/uL — ABNORMAL LOW (ref 4.22–5.81)
RDW: 15.6 % — ABNORMAL HIGH (ref 11.5–15.5)
WBC: 9.9 K/uL (ref 4.0–10.5)
nRBC: 0 % (ref 0.0–0.2)

## 2024-08-25 LAB — LACTATE DEHYDROGENASE: LDH: 239 U/L — ABNORMAL HIGH (ref 105–235)

## 2024-08-25 LAB — POCT INR: INR: 2.6 (ref 2.0–3.0)

## 2024-08-25 MED ORDER — OXYCODONE HCL 5 MG PO TABS: 5.0000 mg | ORAL_TABLET | Freq: Three times a day (TID) | ORAL | 0 refills | Status: AC | PRN

## 2024-08-25 NOTE — Patient Instructions (Signed)
 Start Oxycodone  take 1-2 tablets every 8 hrs for pain Lauren will call you with Coumadin  dosing Return to clinic in 2 months

## 2024-08-25 NOTE — Progress Notes (Signed)
 "  HF Cardiology: Dr. Rolan  Chief complaint: LVAD followup  Marcus Johnson. is a 64 y.o. male who has a h/o DM2, CAD s/p CABG 2002, systolic HF due to ischemic cardiomyopathy with EF 20-25% (echo 12/15), DM2 and CKD. He is s/p Medtronic single chamber ICD.   Admitted in 12/15 due to ADHF. Required short course milrinone  for diuresis. Diuresed 30 pounds.    CPX 2/16 showed severely reduced functional capacity.  There was a significant disconnect between his symptoms and the CPX.  RHC in 6/16 showed fairly normal filling pressures and low but not markedly low cardiac output.    CPX (4/19) was submaximal but suggested severe limitation from HF.    He was admitted in 6/19 with marked volume overload and dyspnea.  Echo in 6/19 showed EF 20% with severe global hypokinesis.  LHC/RHC showed severe native vessel CAD with patent LIMA-D, SVG-OM, and SVG-PLV; cardiac index low.  He was started on milrinone  and diuresed.  We discussed LVAD extensively in light of low output, creatinine that is trending up and concern for decompensation of RV over time. He was adamant that he did not want to pursue LVAD workup yet and did not want a referral for transplant evaluation. Milrinone  was weaned off and he was discharged home.    Admitted 08/01/18-08/07/18 with volume overload and left foot wound. Diuresed 38 lbs with IV lasix  and then transitioned back to torsemide  100 mg BID. ID was consulted with concerns for left foot osteomyelitis noted on CT scan. He got IV ancef  q8 hours x 42 days. [facility] provided teaching and supplies for home antibiotics. Course complicated by AKI. DC weight 200 lbs.    He went into atrial flutter and had DCCV to NSR in 1/20.    Echo in 4/21 showed EF 25% with apical akinesis and diffuse hypokinesis relatively preserved in lateral wall, mildly decreased RV systolic function, PASP 41 mmHg. CPX in 5/21 showed severe functional limitation suggestive of advanced HF. 5/21 Zio patch showed  short atrial flutter runs, 3.8% PVCs.    He developed COVID-19 infection in 1/22.  Symptoms were predominantly GI.    He developed progressive worsening of CHF in early 2022 and was admitted in 3/22 with low output HF.  Impella 5.5 was placed as bridge to Heartmate III LVAD placed in 10/13/20.  He developed post-op renal failure and ended up on dialysis.  He was markedly deconditioned post-op and went to CIR.    Still has pain and minimal function in his right hand.  Nerve conduction study showed a chronic sensorimotor neuropathy in the right arm.    Ramp echo in 3/23 showed severe LV dysfunction with mild RV enlargement and mild dysfunction, mild-moderate AI.  Speed was increased to 5800 rpm.  The aortic valve still opened with every beat but only slightly. Flow increased from 4.1 to 5.3 L/min and PI remained stable.   Patient was admitted in 4/23 with falls and lightheadedness. He fractured his left foot when falling and initially was unable to walk.  He was thought to be hypotensive due to over-diuresis and dry weight was increased.  Midodrine  on HD days was also increased.  He was treated for a driveline infection with vancomycin  then doxycycline , now off.   ICD shock for VT in 7/23, not started on amiodarone  given only 1 episode.   Echo in 3/24 showed EF 20-25% with midline IV septum, mild RV dysfunction with severe RVE, moderate AI.   In 5/24,  patient had VT treated with ATP.      Patient was shocked by his ICD for VT on 04/17/23, 04/20/23, and 04/21/23.  I had him increase his amiodarone  to 200 mg bid x 2 wks then back to 200 mg daily.  After discussion with Dr. Fernande, I started him on mexiletine as well.    Ramp echo was in 9/24.  Starting speed 5800 rpm with moderate AI, slight aortic valve opening each beat, moderate RV enlargement/moderate RV dysfunction, septum shifted slightly to the left.  Speed decreased to 5700 rpm with moderate AI, aortic valve opening each beat, midline septum with  flow fairly stable at 5 L/min.  I left speed at 5700 rpm.   With chronic thrombocytopenia, patient had hematology workup including bone marrow biopsy. The biopsy was unremarkable and platelets have trended back up.    He saw urology for workup of hematuria.  Nephrolithiasis has been found.   Echo in 9/25 showed EF 20% with moderate RV enlargement/mild RV dysfunction, IV septum near midline, Aortic valve probably opens with each beat, moderate aortic insufficiency, moderate TR, IVC dilated.   Patient returns for followup of LVAD.  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  This is very painful.  He is being followed by wound care and nephrology is trying to treat by adjusting dialysate.  No exertional dyspnea with usual activities.  MAP stable today.  No lightheadedness.  No alarms on device interrogation, 5-10 PI events daily.   Labs (3/24): LDH 222, hgb 11.2 Labs (6/24): LDH 219, hgb 11.3, TSH normal Labs (9/24): TSH normal, hgb 12, LFTs normal, LDH 215 Labs (1/25): LFTs normal, TSH normal Labs (3/25): LFTs normal, TSH normal, LDH 200, hgb 11.3, plts 115 Labs (6/25): LDH 194, LFTs normal Labs (7/25): hgb 11.2, plts 145 => 132, LDH 214 Labs (10/25): LDL 69, hgb 11.9  Past Medical History:  Diagnosis Date   AICD (automatic cardioverter/defibrillator) present    Single-chamber  implantable cardiac defibrillator - Medtronic   Atrial fibrillation (HCC)    Cataract    Mixed form OU   CHF (congestive heart failure) (HCC)    Chronic kidney disease    Chronic kidney disease (CKD), stage III (moderate) (HCC)    Chronic systolic heart failure (HCC)    a. Echo 5/13: Mild LVE, mild LVH, EF 10%, anteroseptal, lateral, apical AK, mild MR, mild LAE, moderately reduced RVSF, mild RAE, PASP 60;  b. 07/2014 Echo: EF 20-2%, diff HK, AKI of antsep/apical/mid-apicalinferior, mod reduced RV.   Coronary artery disease    a. s/p CABG 2002 b. LHC 5/13:  dLM 80%, LAD  subtotally occluded, pCFX occluded, pRCA 50%, mid? Occlusion with high take off of the PDA with 70% multiple lesions-not bypassed and supplies collaterals to LAD, LIMA-IM/ramus ok, S-OM ok, S-PLA branches ok. Medical therapy was recommended   COVID    Diabetic retinopathy (HCC)    NPDR OD, PDR OS   Dyspnea    Gout    on daily RX (01/08/2018)   Hypertension    Hypertensive retinopathy    OU   Ischemic cardiomyopathy    a. Echo 5/13: Mild LVE, mild LVH, EF 10%, anteroseptal, lateral, apical AK, mild MR, mild LAE, moderately reduced RVSF, mild RAE, PASP 60;  b. 01/2012 s/p MDT [id number] Protecta XT VR AICD;  c. 07/2014 Echo: EF 20-2%, diff HK, AKI of antsep/apical/mid-apicalinferior, mod reduced RV.   MRSA (methicillin resistant Staphylococcus aureus)    Status  post right foot plantar deep infection with MRSA status post  I&D 10/2008   Myocardial infarction [facility])    was told I'd had several before heart OR in 2002 (01/08/2018)   Noncompliance    Peripheral neuropathy    Retinopathy, diabetic, background (HCC)    Syncope    Type II diabetes mellitus (HCC)    requiring insulin     Vitreous hemorrhage, left (HCC)    and proliferative diabetic retinopathy    Current Outpatient Medications  Medication Sig Dispense Refill   ACCU-CHEK GUIDE TEST test strip USE AS INSTRUCTED 3 TIMES DAILY 100 strip 1   acetaminophen  (TYLENOL ) 325 MG tablet Take by mouth.     amiodarone  (PACERONE ) 200 MG tablet Take 1 tablet (200 mg total) by mouth 2 (two) times daily. Take 1 tablet (200 mg) by mouth twice daily for 2 weeks starting 04/23/23, then decrease to 1 tablet (200 mg) daily 05/07/23. (Patient taking differently: Take 200 mg by mouth daily.) 90 tablet 3   BD PEN NEEDLE NANO 2ND GEN 32G X 4 MM MISC USE TO INJECT INSULIN  DAILY 100 each 0   Blood Glucose Monitoring Suppl (ONE TOUCH ULTRA 2) w/Device KIT 1 Device by Does not apply route 3 (three) times daily. 1 kit 0   cephALEXin  (KEFLEX ) 500 MG capsule Take 1  capsule (500 mg total) by mouth daily. 30 capsule 11   finasteride (PROSCAR) 5 MG tablet Take 5 mg by mouth daily.     gabapentin  (NEURONTIN ) 100 MG capsule TAKE 1 CAPSULE ( 100 MG) IN THE MORNING AND 2 CAPSULES ( 200 MG) AT BEDTIME 90 capsule 3   insulin  glargine (LANTUS  SOLOSTAR) 100 UNIT/ML Solostar Pen INJECT 60 UNITS INTO THE SKIN AT BEDTIME. 15 mL 23   Lancets (ONETOUCH DELICA PLUS LANCET30G) MISC 1 each by Does not apply route 4 (four) times daily -  before meals and at bedtime. 100 each 3   mexiletine (MEXITIL) 150 MG capsule TAKE 1 CAPSULE BY MOUTH TWICE A DAY 180 capsule 3   midodrine  (PROAMATINE ) 10 MG tablet TAKE 2 TABLETS 3 TIMES DAILY ON DIALYSIS DAYS NO MIDODRINE  ON NON DIALYSIS DAYS 240 tablet 7   multivitamin (RENA-VIT) TABS tablet TAKE 1 TABLET BY MOUTH EVERYDAY AT BEDTIME 90 tablet 3   ondansetron  (ZOFRAN ) 4 MG tablet Take 1 tablet (4 mg total) by mouth every 8 (eight) hours as needed for nausea or vomiting. 30 tablet 3   pantoprazole  (PROTONIX ) 40 MG tablet TAKE 1 TABLET BY MOUTH EVERY DAY 90 tablet 3   polyethylene glycol powder (GLYCOLAX /MIRALAX ) 17 GM/SCOOP powder Take 17 g by mouth daily as needed for mild constipation or moderate constipation.     rosuvastatin  (CRESTOR ) 10 MG tablet TAKE 1 TABLET BY MOUTH EVERY DAY 90 tablet 1   sevelamer (RENAGEL) 800 MG tablet Take 800 mg by mouth 2 (two) times daily. (Patient taking differently: Take 800 mg by mouth daily.)     traMADol  (ULTRAM ) 50 MG tablet Take 1 tablet (50 mg total) by mouth every 12 (twelve) hours as needed. 30 tablet 0   warfarin (COUMADIN ) 3 MG tablet TAKE 4.5 MG (1.5 TABS) DAILY OR AS DIRECTED BY HF CLINIC 150 tablet 3   naphazoline-glycerin  (CLEAR EYES REDNESS) 0.012-0.25 % SOLN Place 1-2 drops into the right eye 4 (four) times daily as needed for eye irritation. (Patient not taking: Reported on 08/25/2024)     oxyCODONE  (ROXICODONE ) 5 MG immediate release tablet Take 1-2 tablets (5-10 mg total) by mouth  every 8  (eight) hours as needed for severe pain (pain score 7-10). 30 tablet 0   No current facility-administered medications for this encounter.    Meclizine  hcl and Ivabradine   REVIEW OF SYSTEMS: All systems negative except as listed in HPI, PMH and Problem list.  LVAD INTERROGATION:  LVAD Documentation    08/25/2024  Device Info  LVAD Type: Heartmate III  Therapy Type: Destination Therapy      08/25/2024  Vitals  Heart Rate: 89 BPM  Automatic BP: 135/81  Doppler MAP: 170 mmHg  SpO2: 100 %    Last 3 Weights Weight Weight  08/25/2024 85.639 kg 188 lb 12.8 oz  08/03/2024 83.008 kg 183 lb  07/23/2024 83.008 kg 183 lb       08/25/2024  LVAD Paramaters  Speed: 5700 RPM  Flow: 5 LPM  PI: 3  Power: 5 Watts  Hematocrit: 37 %  Alarms: none  Events: 5-10  Last Speed Change Date: 05/02/2023  Last Ramp Echo Date: 05/02/2023  Last Right Heart Cath Date: 01/20/2018  Bleeding History: Yes  Type of Bleeding Hx: Other  Type of Bleeding Hx: hematuria  Type of Dressing: Other  Type of Dressing: Weekly using daily kit  Annual Maintenance Date: 10/14/2023    Labs    Units 08/25/24 0919 08/25/24 0000 08/18/24 0000 07/23/24 0601 07/23/24 0558 06/02/24 0000 05/26/24 0845 01/14/24 0000 01/07/24 1318 12/31/23 0000 12/24/23 0835  INR  1.9* 2.6 2.3   < >  --    < >  --    < >  --    < > 1.5*  LDH U/L 239*  --   --   --   --   --   --   --  194*  --  178  HGB g/dL 88.7*  --   --   --  86.6  --  11.9*   < > 10.8*  --  10.9*  CREATININE mg/dL 4.33*  --   --   --  4.09*  --  6.72*  --   --   --  5.38*   < > = values in this interval not displayed.        Physical Exam: MAP 95 General: Well appearing this am. NAD.  HEENT: Normal. Neck: Supple, JVP 8-9 cm. Carotids OK.  Cardiac:  Mechanical heart sounds with LVAD hum present.  Lungs:  CTAB, normal effort.  Abdomen:  NT, ND, no HSM. No bruits or masses. +BS  LVAD exit site: Well-healed and incorporated. Dressing dry and intact. No  erythema or drainage. Stabilization device present and accurately applied. Driveline dressing changed daily per sterile technique. Extremities:  Warm and dry. No cyanosis, clubbing, rash, or edema. Ulcerated penis glans Neuro:  Alert & oriented x 3. Cranial nerves grossly intact. Moves all 4 extremities w/o difficulty. Affect pleasant    ASSESSMENT AND PLAN:  1. Chronic systolic CHF:  Ischemic cardiomyopathy. Has Medtronic ICD. Low output HF, admitted and Impella 5.5 placed 10/04/20.  HM3 LVAD was placed on 10/13/20. Developed post-op renal failure requiring eventual hemodialysis.  Ramp echo in 3/23, speed increased to 5800 rpm under echo guidance with increase in flow and stable PI.  Echo in 3/24 showed EF 20-25% with midline IV septum, mild RV dysfunction with severe RVE, moderate AI. Ramp echo in 9/24 with decrease in speed in setting of VT episodes.  Echo today showed EF 20% with moderate RV enlargement/mild RV dysfunction, IV septum near midline, Aortic valve probably opens  with each beat, moderate aortic insufficiency, moderate TR, IVC dilated. MAP elevated on non-HD days, takes midodrine  on HD days. No further VT.  NYHA class II, not volume overloaded on exam.  LVAD interrogation shows few PI events on HD days, no low flows/suction.  - Continue midodrine  on HD days and reminded him not to take midodrine  on the non-HD days.    - He did not tolerate hydralazine  on non-HD days for elevated MAP.  2. LVAD:  VAD interrogated personally.  Parameters stable with a few PI events on HD days. No low flow events or suction.  - He is now off ASA.  - Continue warfarin with INR goal 2-2.5.   3. CAD s/p CABG 2002:  Last cath in 6/19 with patent grafts. No chest pain.  - Continue Crestor , recent lipids ok.  4. ESRD: Currently dialyzing via tunneled catheter. No low flow events.  - Continue HD M-W-F  - With continuous flow LVAD, think AV fistula would be unlikely to mature.   5. Atrial flutter/fibrillation: s/p  DC-CV 08/21/18.  Has occasional episodes of AF but not prolonged.  - Continue amiodarone , follow LFTs and TSH.  He will need regular eye exam.  6. 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 This has been improving though not completely normal.   7. DM: Per primary care.  8. Anemia: no BRBPR/melena.  - CBC today.  9. Suspected Gastroparesis: He is no longer taking Reglan .  10. VT: Episode in 7/23 with successful ICD discharge.  No definite trigger, he did not actually feel the VT. VT again in 5/24 treated with ATP, amiodarone  started.  Episodes of VT 9/11, 9/14, and 04/21/23.  Ramp echo in 9/24 with decreased speed.  No further VT.  - Continue amiodarone  200 mg daily.  Follow LFTs/TSH as above.  - Continue mexiletine 150 mg bid.  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 Driveline site looks good today.  12. Thrombocytopenia: Chronic.  Bone marrow biopsy unremarkable.   - Sees hematology.  13. Calciphylaxis: Has ulceration of penis glans.  This is quite painful.  - Follows with wound clinic.  - Nephrology has adjusted dialysate.  - Tramadol  is not adequate, I will prescribe oxycodone  and refer him to pain clinic.      Followup 2 months.   I spent 42 minutes reviewing records, interviewing/examining patient, and managing orders.    Ezra Shuck 08/25/2024  "

## 2024-08-25 NOTE — Progress Notes (Signed)
 Patient presents for 2 month follow up in VAD clinic with his brother Silver. Reports no problems with VAD equipment or concerns with drive line.  Patient arrived via wheelchair today but was able to step on the scale for his weight. Pt denies dizziness, falls, and heart failure symptoms. Complains of intermittent lightheadedness and fatigue on dialysis days. Still experiencing some intermittent nausea on HD days, but this has improved with Zofran .  Pt has not had any recent ICD shocks and is taking his Amiodarone  200 BID and Mexiletine 150 mg BID as prescribed.   Patient reports he continues M/W/F dialysis. Pt confirms he takes Midodrine  TID on HD days only.   Pt on Keflex  500 mg BID (dosed for HD schedule). Silver reports they are changing dressing weekly using daily kit. Currently wearing 2 anchors. Reports he wears binder at home, but does not like to wear during dialysis.   Pts main complaint today is the pain in his penis from calciphylaxis. Pt tells me he is having 10/10 pain. He states this is worst pain he has ever experienced. Dr Mclean gave him script for Oxycodone  5-10 mg q8hrs. We will refer him to a pain clinic.  Vital Signs:  Doppler Pressure: 170 Automatic BP: 114/101 (95) HR: 89 NSR O2: 100% RA   Weight: 188.8 lbs w/ eqt Last wt: 190.6 w/ eqt   VAD Indication: Destination Therapy due to CKD Stage 3b   LVAD assessment:  HM III: Speed: 5700 rpms Flow: 5.1 Power: 4.6 w    PI: 3.3 Alarms: none Events: 5-10  Fixed speed:  5700 Low speed limit:  5400  Primary controller: Back up battery due for replacement in 10 months Secondary controller: Back up battery due for replacement in 22 months  I reviewed the LVAD parameters from today and compared the results to the patient's prior recorded data. LVAD interrogation was NEGATIVE for significant power changes, NEGATIVE for clinical alarms and STABLE for PI events/speed drops. No programming changes were made and pump is  functioning within specified parameters. Pt is performing daily controller and system monitor self tests along with completing weekly and monthly maintenance for LVAD equipment.   LVAD equipment check completed and is in good working order. Back-up equipment present and back up battery charged at this appointment.   Annual Equipment Maintenance on UBC/PM was performed 10/29/23.     Exit Site Care: Existing VAD dressing removed and site care performed using sterile technique. Drive line exit site cleaned with Chlora prep applicators x 2, allowed to dry, and gauze dressing with Silverlon patch applied. Exit site healed and incorporated, the velour is exposed at exit site. No redness, tenderness, drainage, foul odor or rash noted. Drive line anchor re-applied x 2.  Pt denies fever or chills.  Pt given 8 daily kits, 14 anchors, and small tegaderms for home use.  Device: Medtronic single Therapies: VF: 240 VT: 200 Slow VT: 143 Pacing: VVI 40 Last check: 07/07/24   BP & Labs:  Doppler 170 - Doppler is reflecting modified systolic  Hgb 11.2 - No S/S of bleeding. Specifically denies melena/BRBPR or nosebleeds.   LDH 239 - established baseline of 160 - 180. Denies tea-colored urine. No power elevations noted on interrogation.   Plan: Start Oxycodone  take 1-2 tablets every 8 hrs for pain Referred to [facility] in [place] will call you with Coumadin  dosing Return to clinic in 2 months  Lauraine Ip RN, BSN VAD Coordinator 24/7 Pager [phone]

## 2024-08-27 ENCOUNTER — Telehealth (HOSPITAL_COMMUNITY): Payer: Self-pay | Admitting: Unknown Physician Specialty

## 2024-08-27 ENCOUNTER — Encounter (HOSPITAL_BASED_OUTPATIENT_CLINIC_OR_DEPARTMENT_OTHER): Admitting: Internal Medicine

## 2024-08-27 DIAGNOSIS — E1122 Type 2 diabetes mellitus with diabetic chronic kidney disease: Secondary | ICD-10-CM

## 2024-08-27 DIAGNOSIS — N485 Ulcer of penis: Secondary | ICD-10-CM | POA: Diagnosis not present

## 2024-08-27 DIAGNOSIS — Z992 Dependence on renal dialysis: Secondary | ICD-10-CM | POA: Diagnosis not present
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
## 2024-09-01 ENCOUNTER — Ambulatory Visit (HOSPITAL_COMMUNITY): Payer: Self-pay | Admitting: Pharmacist

## 2024-09-01 LAB — POCT INR: INR: 1.8 — AB (ref 2.0–3.0)

## 2024-09-08 ENCOUNTER — Ambulatory Visit (HOSPITAL_COMMUNITY): Payer: Self-pay

## 2024-09-08 LAB — POCT INR: INR: 2.5 (ref 2.0–3.0)

## 2024-09-09 ENCOUNTER — Telehealth (HOSPITAL_COMMUNITY): Payer: Self-pay | Admitting: *Deleted

## 2024-09-09 NOTE — Telephone Encounter (Signed)
 Received page from patient reporting he received call from [facility] clinic stating they were declining his referral for pain management. Pt unable to elaborate with more information. States he is willing to discuss amputation if this will help with his pain.   Calciphylaxis penile lesion managed by wound care center. Has f/u appt with them tomorrow. Reached out to provider at wound care center to discuss prescribing pain medication for pt. Provider shares that the wound center manages the wound, but is unable to prescribe pain medication for pt.   VAD coordinator called [facility] re: declined referral. Staff member I spoke to said their office has no record of a referral for patient, and requested I resend referral request. Clinical packet faxed to [facility] [phone] as requested.   Pt updated that referral was resent. He verbalized understanding.   Isaiah Knoll RN VAD Coordinator  Office: [phone]  24/7 Pager: [phone]

## 2024-09-10 ENCOUNTER — Other Ambulatory Visit (HOSPITAL_COMMUNITY): Payer: Self-pay

## 2024-09-10 ENCOUNTER — Encounter (HOSPITAL_BASED_OUTPATIENT_CLINIC_OR_DEPARTMENT_OTHER): Admitting: Internal Medicine

## 2024-09-10 ENCOUNTER — Other Ambulatory Visit (HOSPITAL_COMMUNITY): Payer: Self-pay | Admitting: Cardiology

## 2024-09-10 ENCOUNTER — Telehealth (HOSPITAL_COMMUNITY): Payer: Self-pay

## 2024-09-10 NOTE — Telephone Encounter (Signed)
 Advanced Heart Failure Patient Advocate Encounter  Prior authorization for Mexiletine has been submitted and approved by phone. Test billing returns $57 for 90 day supply.  Effective: 08/06/2024 to 08/05/2025  Rachel DEL, CPhT Rx Patient Advocate Phone: [phone]

## 2024-09-17 ENCOUNTER — Encounter (HOSPITAL_BASED_OUTPATIENT_CLINIC_OR_DEPARTMENT_OTHER): Admitting: Internal Medicine

## 2024-10-15 ENCOUNTER — Ambulatory Visit (HOSPITAL_COMMUNITY): Admitting: Cardiology

## 2024-11-12 ENCOUNTER — Ambulatory Visit: Admitting: Podiatry

## 2025-08-09 ENCOUNTER — Ambulatory Visit
